# Patient Record
Sex: Male | Born: 1960 | Hispanic: No | Marital: Single
Health system: Midwestern US, Community
[De-identification: ages and names within clinical notes are randomized; demographics above are authoritative.]

## PROBLEM LIST (undated history)

## (undated) DIAGNOSIS — I219 Acute myocardial infarction, unspecified: Secondary | ICD-10-CM

## (undated) DIAGNOSIS — J449 Chronic obstructive pulmonary disease, unspecified: Secondary | ICD-10-CM

## (undated) DIAGNOSIS — R0600 Dyspnea, unspecified: Secondary | ICD-10-CM

## (undated) DIAGNOSIS — R251 Tremor, unspecified: Secondary | ICD-10-CM

## (undated) DIAGNOSIS — G8929 Other chronic pain: Secondary | ICD-10-CM

## (undated) DIAGNOSIS — I639 Cerebral infarction, unspecified: Secondary | ICD-10-CM

## (undated) DIAGNOSIS — K279 Peptic ulcer, site unspecified, unspecified as acute or chronic, without hemorrhage or perforation: Secondary | ICD-10-CM

## (undated) DIAGNOSIS — M1990 Unspecified osteoarthritis, unspecified site: Secondary | ICD-10-CM

## (undated) DIAGNOSIS — I209 Angina pectoris, unspecified: Secondary | ICD-10-CM

## (undated) DIAGNOSIS — K5792 Diverticulitis of intestine, part unspecified, without perforation or abscess without bleeding: Secondary | ICD-10-CM

## (undated) DIAGNOSIS — K219 Gastro-esophageal reflux disease without esophagitis: Secondary | ICD-10-CM

## (undated) DIAGNOSIS — Z7901 Long term (current) use of anticoagulants: Secondary | ICD-10-CM

## (undated) DIAGNOSIS — F32A Depression, unspecified: Secondary | ICD-10-CM

## (undated) DIAGNOSIS — F1910 Other psychoactive substance abuse, uncomplicated: Secondary | ICD-10-CM

## (undated) DIAGNOSIS — F319 Bipolar disorder, unspecified: Secondary | ICD-10-CM

## (undated) DIAGNOSIS — I38 Endocarditis, valve unspecified: Secondary | ICD-10-CM

## (undated) DIAGNOSIS — C801 Malignant (primary) neoplasm, unspecified: Secondary | ICD-10-CM

## (undated) DIAGNOSIS — J984 Other disorders of lung: Secondary | ICD-10-CM

## (undated) DIAGNOSIS — F209 Schizophrenia, unspecified: Secondary | ICD-10-CM

## (undated) DIAGNOSIS — I2510 Atherosclerotic heart disease of native coronary artery without angina pectoris: Secondary | ICD-10-CM

## (undated) DIAGNOSIS — F329 Major depressive disorder, single episode, unspecified: Secondary | ICD-10-CM

## (undated) DIAGNOSIS — F419 Anxiety disorder, unspecified: Secondary | ICD-10-CM

## (undated) DIAGNOSIS — E785 Hyperlipidemia, unspecified: Secondary | ICD-10-CM

## (undated) DIAGNOSIS — B1920 Unspecified viral hepatitis C without hepatic coma: Secondary | ICD-10-CM

## (undated) DIAGNOSIS — I5189 Other ill-defined heart diseases: Secondary | ICD-10-CM

## (undated) DIAGNOSIS — M719 Bursopathy, unspecified: Secondary | ICD-10-CM

## (undated) DIAGNOSIS — I10 Essential (primary) hypertension: Secondary | ICD-10-CM

## (undated) DIAGNOSIS — N50811 Right testicular pain: Secondary | ICD-10-CM

## (undated) DIAGNOSIS — R300 Dysuria: Secondary | ICD-10-CM

## (undated) HISTORY — PX: BACK SURGERY: SHX140

## (undated) HISTORY — DX: Unspecified viral hepatitis C without hepatic coma: B19.20

## (undated) HISTORY — PX: SPINE SURGERY: SHX786

## (undated) HISTORY — PX: APPENDECTOMY: SHX54

## (undated) HISTORY — PX: CORONARY ANGIOPLASTY WITH STENT PLACEMENT: SHX49

## (undated) HISTORY — PX: ABDOMINAL SURGERY: SHX537

## (undated) HISTORY — PX: COLONOSCOPY: SHX5424

## (undated) HISTORY — PX: COLON SURGERY: SHX602

## (undated) HISTORY — DX: Other disorders of lung: J98.4

---

## 1998-08-19 ENCOUNTER — Emergency Department (HOSPITAL_COMMUNITY): Admission: EM | Admit: 1998-08-19 | Discharge: 1998-08-19 | Payer: Self-pay | Admitting: Emergency Medicine

## 1998-08-19 ENCOUNTER — Encounter: Payer: Self-pay | Admitting: Emergency Medicine

## 1999-01-21 ENCOUNTER — Emergency Department (HOSPITAL_COMMUNITY): Admission: EM | Admit: 1999-01-21 | Discharge: 1999-01-21 | Payer: Self-pay

## 1999-02-23 ENCOUNTER — Emergency Department (HOSPITAL_COMMUNITY): Admission: EM | Admit: 1999-02-23 | Discharge: 1999-02-23 | Payer: Self-pay | Admitting: *Deleted

## 2001-05-26 ENCOUNTER — Inpatient Hospital Stay (HOSPITAL_COMMUNITY): Admission: EM | Admit: 2001-05-26 | Discharge: 2001-05-31 | Payer: Self-pay | Admitting: Psychiatry

## 2005-09-26 ENCOUNTER — Emergency Department (HOSPITAL_COMMUNITY): Admission: EM | Admit: 2005-09-26 | Discharge: 2005-09-26 | Payer: Self-pay | Admitting: Emergency Medicine

## 2010-07-25 DIAGNOSIS — B1920 Unspecified viral hepatitis C without hepatic coma: Secondary | ICD-10-CM

## 2010-07-25 HISTORY — DX: Unspecified viral hepatitis C without hepatic coma: B19.20

## 2011-03-31 ENCOUNTER — Emergency Department (HOSPITAL_COMMUNITY)
Admission: EM | Admit: 2011-03-31 | Discharge: 2011-03-31 | Disposition: A | Discharge status: Home / Self Care | Payer: Self-pay | Attending: Emergency Medicine | Admitting: Emergency Medicine

## 2011-03-31 DIAGNOSIS — M25519 Pain in unspecified shoulder: Secondary | ICD-10-CM | POA: Insufficient documentation

## 2011-03-31 DIAGNOSIS — Z8619 Personal history of other infectious and parasitic diseases: Secondary | ICD-10-CM | POA: Insufficient documentation

## 2011-03-31 DIAGNOSIS — F329 Major depressive disorder, single episode, unspecified: Secondary | ICD-10-CM | POA: Insufficient documentation

## 2011-03-31 DIAGNOSIS — Z79899 Other long term (current) drug therapy: Secondary | ICD-10-CM | POA: Insufficient documentation

## 2011-03-31 DIAGNOSIS — M545 Low back pain, unspecified: Secondary | ICD-10-CM | POA: Insufficient documentation

## 2011-03-31 DIAGNOSIS — F3289 Other specified depressive episodes: Secondary | ICD-10-CM | POA: Insufficient documentation

## 2011-03-31 DIAGNOSIS — M25559 Pain in unspecified hip: Secondary | ICD-10-CM | POA: Insufficient documentation

## 2011-04-21 ENCOUNTER — Emergency Department (HOSPITAL_COMMUNITY)
Admission: EM | Admit: 2011-04-21 | Discharge: 2011-04-21 | Disposition: A | Discharge status: Home / Self Care | Payer: Self-pay | Attending: Emergency Medicine | Admitting: Emergency Medicine

## 2011-04-21 DIAGNOSIS — T7840XA Allergy, unspecified, initial encounter: Secondary | ICD-10-CM | POA: Insufficient documentation

## 2011-04-21 DIAGNOSIS — R21 Rash and other nonspecific skin eruption: Secondary | ICD-10-CM | POA: Insufficient documentation

## 2011-04-21 DIAGNOSIS — L2989 Other pruritus: Secondary | ICD-10-CM | POA: Insufficient documentation

## 2011-04-21 DIAGNOSIS — L298 Other pruritus: Secondary | ICD-10-CM | POA: Insufficient documentation

## 2011-04-21 DIAGNOSIS — I10 Essential (primary) hypertension: Secondary | ICD-10-CM | POA: Insufficient documentation

## 2011-04-21 DIAGNOSIS — F329 Major depressive disorder, single episode, unspecified: Secondary | ICD-10-CM | POA: Insufficient documentation

## 2011-04-21 DIAGNOSIS — Z79899 Other long term (current) drug therapy: Secondary | ICD-10-CM | POA: Insufficient documentation

## 2011-04-21 DIAGNOSIS — F3289 Other specified depressive episodes: Secondary | ICD-10-CM | POA: Insufficient documentation

## 2011-04-21 DIAGNOSIS — L509 Urticaria, unspecified: Secondary | ICD-10-CM | POA: Insufficient documentation

## 2011-04-21 DIAGNOSIS — Z8619 Personal history of other infectious and parasitic diseases: Secondary | ICD-10-CM | POA: Insufficient documentation

## 2011-05-03 ENCOUNTER — Emergency Department (HOSPITAL_COMMUNITY)
Admission: EM | Admit: 2011-05-03 | Discharge: 2011-05-03 | Disposition: A | Discharge status: Home / Self Care | Payer: Self-pay | Attending: Emergency Medicine | Admitting: Emergency Medicine

## 2011-05-03 DIAGNOSIS — M542 Cervicalgia: Secondary | ICD-10-CM | POA: Insufficient documentation

## 2011-05-03 DIAGNOSIS — Z8619 Personal history of other infectious and parasitic diseases: Secondary | ICD-10-CM | POA: Insufficient documentation

## 2011-05-03 DIAGNOSIS — M25539 Pain in unspecified wrist: Secondary | ICD-10-CM | POA: Insufficient documentation

## 2011-05-03 DIAGNOSIS — X58XXXA Exposure to other specified factors, initial encounter: Secondary | ICD-10-CM | POA: Insufficient documentation

## 2011-05-03 DIAGNOSIS — M25519 Pain in unspecified shoulder: Secondary | ICD-10-CM | POA: Insufficient documentation

## 2011-05-03 DIAGNOSIS — S139XXA Sprain of joints and ligaments of unspecified parts of neck, initial encounter: Secondary | ICD-10-CM | POA: Insufficient documentation

## 2011-05-03 DIAGNOSIS — M25529 Pain in unspecified elbow: Secondary | ICD-10-CM | POA: Insufficient documentation

## 2011-05-03 DIAGNOSIS — M129 Arthropathy, unspecified: Secondary | ICD-10-CM | POA: Insufficient documentation

## 2011-07-27 DIAGNOSIS — M1990 Unspecified osteoarthritis, unspecified site: Secondary | ICD-10-CM | POA: Insufficient documentation

## 2011-11-04 ENCOUNTER — Encounter (HOSPITAL_COMMUNITY): Payer: Self-pay | Admitting: Emergency Medicine

## 2011-11-04 ENCOUNTER — Emergency Department (HOSPITAL_COMMUNITY)
Admission: EM | Admit: 2011-11-04 | Discharge: 2011-11-04 | Disposition: A | Discharge status: Home / Self Care | Payer: Self-pay | Attending: Emergency Medicine | Admitting: Emergency Medicine

## 2011-11-04 ENCOUNTER — Emergency Department (HOSPITAL_COMMUNITY): Payer: Self-pay

## 2011-11-04 DIAGNOSIS — J4489 Other specified chronic obstructive pulmonary disease: Secondary | ICD-10-CM | POA: Insufficient documentation

## 2011-11-04 DIAGNOSIS — J441 Chronic obstructive pulmonary disease with (acute) exacerbation: Secondary | ICD-10-CM

## 2011-11-04 DIAGNOSIS — J449 Chronic obstructive pulmonary disease, unspecified: Secondary | ICD-10-CM | POA: Insufficient documentation

## 2011-11-04 DIAGNOSIS — F17200 Nicotine dependence, unspecified, uncomplicated: Secondary | ICD-10-CM | POA: Insufficient documentation

## 2011-11-04 DIAGNOSIS — J40 Bronchitis, not specified as acute or chronic: Secondary | ICD-10-CM

## 2011-11-04 HISTORY — DX: Anxiety disorder, unspecified: F41.9

## 2011-11-04 HISTORY — DX: Depression, unspecified: F32.A

## 2011-11-04 HISTORY — DX: Bursopathy, unspecified: M71.9

## 2011-11-04 HISTORY — DX: Unspecified osteoarthritis, unspecified site: M19.90

## 2011-11-04 HISTORY — DX: Major depressive disorder, single episode, unspecified: F32.9

## 2011-11-04 LAB — BASIC METABOLIC PANEL
BUN: 8 mg/dL (ref 6–23)
CO2: 24 mEq/L (ref 19–32)
Calcium: 9.1 mg/dL (ref 8.4–10.5)
Glucose, Bld: 91 mg/dL (ref 70–99)
Potassium: 3.9 mEq/L (ref 3.5–5.1)
Sodium: 139 mEq/L (ref 135–145)

## 2011-11-04 LAB — CBC
HCT: 49.6 % (ref 39.0–52.0)
Hemoglobin: 17.3 g/dL — ABNORMAL HIGH (ref 13.0–17.0)
MCH: 33.8 pg (ref 26.0–34.0)
MCHC: 34.9 g/dL (ref 30.0–36.0)
RBC: 5.12 MIL/uL (ref 4.22–5.81)

## 2011-11-04 MED ORDER — SUMATRIPTAN SUCCINATE 6 MG/0.5ML ~~LOC~~ SOLN: 6.0000 mg | Freq: Once | SUBCUTANEOUS | Status: DC

## 2011-11-04 MED ORDER — PREDNISONE 20 MG PO TABS
60.0000 mg | ORAL_TABLET | Freq: Once | ORAL | Status: AC
  Administered 2011-11-04: 60 mg via ORAL
  Filled 2011-11-04: qty 3

## 2011-11-04 MED ORDER — PREDNISONE 50 MG PO TABS: 50.0000 mg | ORAL_TABLET | Freq: Every day | ORAL | Status: DC

## 2011-11-04 MED ORDER — ALBUTEROL SULFATE HFA 108 (90 BASE) MCG/ACT IN AERS
2.0000 | INHALATION_SPRAY | RESPIRATORY_TRACT | Status: DC | PRN
  Filled 2011-11-04: qty 6.7

## 2011-11-04 MED ORDER — IBUPROFEN 800 MG PO TABS
800.0000 mg | ORAL_TABLET | Freq: Once | ORAL | Status: AC
  Administered 2011-11-04: 800 mg via ORAL
  Filled 2011-11-04: qty 1

## 2011-11-04 MED ORDER — ALBUTEROL SULFATE (5 MG/ML) 0.5% IN NEBU
5.0000 mg | INHALATION_SOLUTION | Freq: Once | RESPIRATORY_TRACT | Status: AC
  Administered 2011-11-04: 5 mg via RESPIRATORY_TRACT
  Filled 2011-11-04: qty 0.5

## 2011-11-04 MED ORDER — PREDNISONE 20 MG PO TABS: 60.0000 mg | ORAL_TABLET | Freq: Once | ORAL | Status: DC

## 2011-11-04 MED ORDER — IPRATROPIUM BROMIDE 0.02 % IN SOLN
0.5000 mg | Freq: Once | RESPIRATORY_TRACT | Status: AC
  Administered 2011-11-04: 0.5 mg via RESPIRATORY_TRACT
  Filled 2011-11-04: qty 2.5

## 2011-11-04 MED ORDER — ALBUTEROL SULFATE (5 MG/ML) 0.5% IN NEBU
5.0000 mg | INHALATION_SOLUTION | Freq: Once | RESPIRATORY_TRACT | Status: AC
  Administered 2011-11-04: 5 mg via RESPIRATORY_TRACT
  Filled 2011-11-04: qty 1

## 2011-11-04 NOTE — ED Provider Notes (Signed)
History     CSN: [id number]  Arrival date & time 11/04/11  1011   First MD Initiated Contact with Patient 11/04/11 1035      Chief Complaint  Patient presents with  . Chest Pain    (Consider location/radiation/quality/duration/timing/severity/associated sxs/prior treatment) HPI Pt presents with c/o shortness of breath and tightness in his breathing which has been present over the past 1-2 months and getting worse over time.  He feels more short of breath with exertion and with talking.  He has had a cough but not productive.  No fever/chills.  No leg swelling.  No chest pain, no nausea or diaphoresis.  No hx DVT/PE, no recent travel or immobilization.  Pt does endorse smoking cigarettes. He has had no treatment prior to arrival. There are no associated systemic symptoms, there are no other alleviating or modifying factors.   Past Medical History  Diagnosis Date  . Anxiety   . Depression   . Arthritis   . Bursitis     Past Surgical History  Procedure Date  . Abdominal surgery     No family history on file.  History  Substance Use Topics  . Smoking status: Current Everyday Smoker -- 1.0 packs/day    Types: Cigarettes  . Smokeless tobacco: Not on file  . Alcohol Use: No      Review of Systems ROS reviewed and all otherwise negative except for mentioned in HPI  Allergies  Naproxen and Tramadol  Home Medications   Current Outpatient Rx  Name Route Sig Dispense Refill  . FLUTICASONE PROPIONATE 50 MCG/ACT NA SUSP Nasal Place 2 sprays into the nose daily as needed. For allergies.    . IBUPROFEN 800 MG PO TABS Oral Take 800 mg by mouth every 8 (eight) hours as needed. For pain.    Marland Kitchen LORATADINE 10 MG PO TABS Oral Take 10 mg by mouth daily.    Marland Kitchen MIRTAZAPINE 30 MG PO TABS Oral Take 30 mg by mouth at bedtime.    . OXYCODONE-ACETAMINOPHEN 5-325 MG PO TABS Oral Take 1 tablet by mouth every 6 (six) hours as needed. For pain.    Marland Kitchen PREDNISONE 20 MG PO TABS Oral Take 20 mg by  mouth daily.    Marland Kitchen RISPERIDONE 2 MG PO TABS Oral Take 1-2 mg by mouth 3 (three) times daily. 1 tab bid and 0.5 tab qhs    . PREDNISONE 50 MG PO TABS Oral Take 1 tablet (50 mg total) by mouth daily. 5 tablet 0    BP 133/68  Pulse 75  Temp(Src) 98.4 F (36.9 C) (Oral)  Resp 16  SpO2 96% Vitals reviewed Physical Exam Physical Examination: General appearance - alert, well appearing, and in no distress Mental status - alert, oriented to person, place, and time Eyes - pupils equal and reactive, no conjunctival injections Mouth - mucous membranes moist, pharynx normal without lesions Chest - clear to auscultation, no wheezes, rales or rhonchi, symmetric air entry Heart - normal rate, regular rhythm, normal S1, S2, no murmurs, rubs, clicks or gallops Abdomen - soft, nontender, nondistended, no masses or organomegaly, nabs Extremities - peripheral pulses normal, no pedal edema, no clubbing or cyanosis Skin - normal coloration and turgor, no rashes, brisk cap refill  ED Course  Procedures (including critical care time)   Date: 11/04/2011  Rate: 67  Rhythm: normal sinus rhythm  QRS Axis: normal  Intervals: normal  ST/T Wave abnormalities: normal  Conduction Disutrbances:none  Narrative Interpretation:   Old EKG Reviewed: unchanged  compared to prior ekg of 05/25/01    Labs Reviewed  CBC - Abnormal; Notable for the following:    Hemoglobin 17.3 (*)    Platelets 137 (*)    All other components within normal limits  BASIC METABOLIC PANEL  LAB REPORT - SCANNED   No results found.   1. COPD exacerbation   2. Bronchitis       MDM  Pt presents with c/o shortness of breath, and wheezing.  He is a cigarette smoker- no known hx of COPD.  Symtpoms have been ongoing and worsening over the past 1-2 months.  Bilateral wheezing on exam.  CXR reasuring. Symptoms most c/w COPD exacerbation/bronchitis. Pt feels improved after albuterol/atrovent- has been started on steroids.  Will d/c with  course of prednisone and albuterol MDI.  He was given stirct return precautions.  And is agreeable with this plan.         Ethelda Chick, MD 11/07/11 [phone]

## 2011-11-04 NOTE — Discharge Instructions (Signed)
Return to the ED with any concerns including difficulty breathing despite using albuterol inhaler every 4 hours, chest pain, leg swelling, fainting, decreased level of alertness/lethargy, or any other alarming symptoms  You should use the albuterol inhaler- 2 puffs every 4 hours

## 2011-11-04 NOTE — ED Notes (Signed)
Pt presenting to ed with c/o chest pain that been going on x couple of months with shortness of breath and nausea. Pt states symptoms are worse at night. Pt states positive shortness of breath

## 2011-12-05 DIAGNOSIS — F1411 Cocaine abuse, in remission: Secondary | ICD-10-CM | POA: Insufficient documentation

## 2012-02-10 DIAGNOSIS — M75120 Complete rotator cuff tear or rupture of unspecified shoulder, not specified as traumatic: Secondary | ICD-10-CM | POA: Insufficient documentation

## 2012-03-08 ENCOUNTER — Emergency Department (HOSPITAL_COMMUNITY): Payer: Medicaid Other

## 2012-03-08 ENCOUNTER — Encounter (HOSPITAL_COMMUNITY): Payer: Self-pay | Admitting: Vascular Surgery

## 2012-03-08 ENCOUNTER — Emergency Department (HOSPITAL_COMMUNITY)
Admission: EM | Admit: 2012-03-08 | Discharge: 2012-03-08 | Disposition: A | Discharge status: Home / Self Care | Payer: Medicaid Other | Attending: Emergency Medicine | Admitting: Emergency Medicine

## 2012-03-08 DIAGNOSIS — G589 Mononeuropathy, unspecified: Secondary | ICD-10-CM | POA: Insufficient documentation

## 2012-03-08 DIAGNOSIS — R259 Unspecified abnormal involuntary movements: Secondary | ICD-10-CM | POA: Insufficient documentation

## 2012-03-08 DIAGNOSIS — G629 Polyneuropathy, unspecified: Secondary | ICD-10-CM

## 2012-03-08 DIAGNOSIS — J45909 Unspecified asthma, uncomplicated: Secondary | ICD-10-CM | POA: Insufficient documentation

## 2012-03-08 DIAGNOSIS — F329 Major depressive disorder, single episode, unspecified: Secondary | ICD-10-CM | POA: Insufficient documentation

## 2012-03-08 DIAGNOSIS — Z886 Allergy status to analgesic agent status: Secondary | ICD-10-CM | POA: Insufficient documentation

## 2012-03-08 DIAGNOSIS — F411 Generalized anxiety disorder: Secondary | ICD-10-CM | POA: Insufficient documentation

## 2012-03-08 DIAGNOSIS — R253 Fasciculation: Secondary | ICD-10-CM

## 2012-03-08 DIAGNOSIS — F3289 Other specified depressive episodes: Secondary | ICD-10-CM | POA: Insufficient documentation

## 2012-03-08 DIAGNOSIS — G249 Dystonia, unspecified: Secondary | ICD-10-CM

## 2012-03-08 DIAGNOSIS — Z79899 Other long term (current) drug therapy: Secondary | ICD-10-CM | POA: Insufficient documentation

## 2012-03-08 MED ORDER — DIPHENHYDRAMINE HCL 25 MG PO CAPS: 25.0000 mg | ORAL_CAPSULE | Freq: Four times a day (QID) | ORAL | Status: DC | PRN

## 2012-03-08 NOTE — ED Notes (Addendum)
Pt reports he cannot sleep because his arms and legs won't stop moving. He states, "its like I have to move them, it feels like there is electricity in them and then he has to move them." This occurs in both his legs and arms. Denies numbness and tingling, denies feeling like pins and needles. States this has been happening for 2 weeks. States it used to do it every once in a while but now it is happening ever night. Nothing seems to help (iepositioning and medication). Pt states he came in today because he cannot take the lack of sleep anymore. States he was on Oxycodone for his chronic shoulder pain and that this helped him sleep but he didn't like how they made him feel so his doctor took him off.

## 2012-03-08 NOTE — ED Notes (Signed)
Pt reports that his arms and legs move all night long and that he cannot sleep because of this. States it feels like electricity shoots down his extremities and he has to move them.

## 2012-03-08 NOTE — ED Provider Notes (Signed)
History     CSN: [id number]  Arrival date & time 03/08/12  4098   First MD Initiated Contact with Patient 03/08/12 [phone]      Chief Complaint  Patient presents with  . Insomnia    (Consider location/radiation/quality/duration/timing/severity/associated sxs/prior treatment) HPI Comments: Pt with hx of alcohol abuse and cocaine abuse several years ago, hx of depression on risperdal and remeron comes in with cc of insomnia and muscular complains. Pt reports that for the past few days he has been having this sensation mostly at bedtime, where he feels "urge to move" his extremities. If he doesn't moves, he has some involuntary moving, if he does move the extremities voluntarily, he feels better for a little while - but then the urge to move starts building up again. He has no pain associated with that. No numbness, tingling. Pt has no new meds. No prior hx of similar complains.  The history is provided by the patient.    Past Medical History  Diagnosis Date  . Anxiety   . Depression   . Arthritis   . Bursitis     Past Surgical History  Procedure Date  . Abdominal surgery   . Appendectomy     Family History  Problem Relation Age of Onset  . Osteoarthritis Mother   . Stroke Mother   . Heart failure Father     History  Substance Use Topics  . Smoking status: Former Smoker -- 1.0 packs/day    Quit date: 03/04/2012  . Smokeless tobacco: Not on file  . Alcohol Use: No      Review of Systems  Constitutional: Negative for activity change and appetite change.  Respiratory: Negative for cough and shortness of breath.   Cardiovascular: Negative for chest pain.  Gastrointestinal: Negative for abdominal pain.  Genitourinary: Negative for dysuria.    Allergies  Naproxen and Tramadol  Home Medications   Current Outpatient Rx  Name Route Sig Dispense Refill  . ACETAMINOPHEN 500 MG PO TABS Oral Take 1,000 mg by mouth every 6 (six) hours as needed. For pain.    .  ALBUTEROL SULFATE HFA 108 (90 BASE) MCG/ACT IN AERS Inhalation Inhale 2 puffs into the lungs every 6 (six) hours as needed. For shortness of breath.    . DICLOFENAC SODIUM 75 MG PO TBEC Oral Take 75 mg by mouth 2 (two) times daily.    Marland Kitchen FLUTICASONE-SALMETEROL 250-50 MCG/DOSE IN AEPB Inhalation Inhale 1 puff into the lungs every 12 (twelve) hours.    Marland Kitchen MIRTAZAPINE 30 MG PO TABS Oral Take 30 mg by mouth at bedtime.    . OXYCODONE-ACETAMINOPHEN 5-325 MG PO TABS Oral Take 1 tablet by mouth every 6 (six) hours as needed. For pain.    Marland Kitchen RISPERIDONE 2 MG PO TABS Oral Take 1-2 mg by mouth 3 (three) times daily. 1 tab bid and 0.5 tab qhs      BP 127/75  Pulse 61  Temp 97.6 F (36.4 C) (Oral)  Resp 20  SpO2 99%  Physical Exam  Vitals reviewed. Constitutional: He is oriented to person, place, and time. He appears well-developed and well-nourished.  HENT:  Head: Normocephalic and atraumatic.  Eyes: EOM are normal. Pupils are equal, round, and reactive to light.  Neck: Normal range of motion. Neck supple. No JVD present.  Cardiovascular: Normal rate and regular rhythm.   Pulmonary/Chest: Effort normal and breath sounds normal. No respiratory distress. He has no wheezes.  Abdominal: Soft. Bowel sounds are normal. He exhibits no  distension. There is no tenderness. There is no rebound and no guarding.  Neurological: He is alert and oriented to person, place, and time. No cranial nerve deficit. Coordination normal.       NIHSS - 0 No objective sensory deficits, Motor strength upper and lower extremity 4+ and equal Normal cerebellar exam  Skin: Skin is warm and dry.    ED Course  Procedures (including critical care time)  Labs Reviewed - No data to display Dg Cervical Spine Complete  03/08/2012  *RADIOLOGY REPORT*  Clinical Data: Upper extremity tingling  CERVICAL SPINE - COMPLETE 4+ VIEW  Comparison: None.  Findings: Five views of the cervical spine submitted.  No acute fracture or subluxation.   There is disc space flattening with mild anterior spurring at C5-C6 and C6-C7 level.  Narrowing of the neural foramina noted bilaterally at C5-C6 and C6-C7 level.  No prevertebral soft tissue swelling.  Cervical airway is patent.  C1- C2 relationship is unremarkable.  IMPRESSION: No acute fracture or subluxation.  Degenerative changes at C5-C6 and C6-C7 level as described above.  Original Report Authenticated By: Natasha Mead, M.D.     1. Muscular fasciculation   2. Neuropathy       MDM  Ddx: Dystonia Neuropathy Muscular disorder electrolyte abnormality Medication side effects  A/P: Pt comes in with this very atypical, intermittent complain (asymptomatic at ED presentation), that is typically present at night time. My Neuro exam is WNL. Unsure what is causing the symptoms. Could be meds related. Will need PCP f/u.           Derwood Kaplan, MD 03/08/12 1148

## 2012-04-09 HISTORY — PX: SHOULDER SURGERY: SHX246

## 2012-06-27 ENCOUNTER — Encounter: Payer: Self-pay | Admitting: Family Medicine

## 2012-06-27 ENCOUNTER — Ambulatory Visit (INDEPENDENT_AMBULATORY_CARE_PROVIDER_SITE_OTHER): Payer: Self-pay | Admitting: Family Medicine

## 2012-06-27 VITALS — BP 133/78 | HR 56 | Temp 97.7°F | Ht 75.0 in | Wt 246.8 lb

## 2012-06-27 DIAGNOSIS — J441 Chronic obstructive pulmonary disease with (acute) exacerbation: Secondary | ICD-10-CM | POA: Insufficient documentation

## 2012-06-27 DIAGNOSIS — F259 Schizoaffective disorder, unspecified: Secondary | ICD-10-CM | POA: Insufficient documentation

## 2012-06-27 DIAGNOSIS — M1990 Unspecified osteoarthritis, unspecified site: Secondary | ICD-10-CM

## 2012-06-27 DIAGNOSIS — B182 Chronic viral hepatitis C: Secondary | ICD-10-CM | POA: Insufficient documentation

## 2012-06-27 DIAGNOSIS — M25519 Pain in unspecified shoulder: Secondary | ICD-10-CM

## 2012-06-27 DIAGNOSIS — K219 Gastro-esophageal reflux disease without esophagitis: Secondary | ICD-10-CM | POA: Insufficient documentation

## 2012-06-27 DIAGNOSIS — J449 Chronic obstructive pulmonary disease, unspecified: Secondary | ICD-10-CM | POA: Insufficient documentation

## 2012-06-27 DIAGNOSIS — F209 Schizophrenia, unspecified: Secondary | ICD-10-CM

## 2012-06-27 NOTE — Assessment & Plan Note (Signed)
Advised patient to use Albuterol 4 times per day while awake x 2 days until SOB resolves. Continue Advair daily. Lung sounds clear on exam today. Red flags reviewed with patient.

## 2012-06-27 NOTE — Assessment & Plan Note (Signed)
Will continue Diclofenac BID and Tylenol (not to exceed 3 g daily for Hep C). Consider PT referral and Pain clinic referral once he receives orange card. Consider adding Voltaren gel if orange card will pay for it. Follow up in 4 weeks.

## 2012-06-27 NOTE — Patient Instructions (Addendum)
It was nice to meet you today, Keith Gibbs. Once you receive your orange card, please call and schedule follow up appointment. For arthritis, please continue to take Diclofenac and Tylenol over the counter.  Do not exceed 3000 mg per day. For acid reflux, please pick up over the counter Pepcid and/or Rolaids or Tums as needed. For COPD, please continue to take Advair daily and Albuterol every 6 hours for the next 48 hours. Return to clinic in 4 weeks.

## 2012-06-27 NOTE — Progress Notes (Signed)
  Subjective:    Patient ID: Keith Gibbs, male    DOB: 1960-09-15, 51 y.o.   MRN: [id number]  HPI  Patient presents to clinic to establish care, applying for orange card.  He has not had a primary care doctor, but has been going to an orthopedic surgeon at [facility] and goes to [facility] for hx schizophrenia.  He also goes to [facility] for Hepatitis C.  Joint Pain: Pain has a long hx of OA.  He complains mostly of bilateral knee, shoulder, and hip pain.  RT shoulder recently operated on in September, but patient says he still hears popping and grinding.  He has finished PT and ROM has improved, but still feels a dull chronic pain.  Patient takes OTC Tylenol and Diclofenac BID which do not seem helping much anymore.    SOB: Patient has hx COPD.  Former smoker, quit 3 months ago.  Says he still continues to get SOB with exertion and chest heaviness despite using Advair daily and Albuterol.  He was recently on steroids for shoulder pain and he was not happy with side effect, mostly weight gain.  Denies any chest pain associated with nausea/vomiting or excessive.  Denies any cough, fever, chills, or NS.    Review of Systems  Per HPI    Objective:   Physical Exam  Constitutional: No distress.  HENT:  Head: Normocephalic and atraumatic.  Cardiovascular: Normal rate, regular rhythm and normal heart sounds.   Pulmonary/Chest: Effort normal and breath sounds normal. No respiratory distress. He has no wheezes. He has no rales.       Assessment & Plan:

## 2012-06-27 NOTE — Assessment & Plan Note (Signed)
Patient has follow up appointment at [facility].  Once he has orange card, may be able to transfer care to [place]'s Hep C clinic.

## 2012-07-27 ENCOUNTER — Encounter: Payer: Self-pay | Admitting: Family Medicine

## 2012-07-27 ENCOUNTER — Ambulatory Visit (HOSPITAL_COMMUNITY)
Admission: RE | Admit: 2012-07-27 | Discharge: 2012-07-27 | Disposition: A | Discharge status: Home / Self Care | Payer: Medicaid Other | Source: Ambulatory Visit | Attending: Family Medicine | Admitting: Family Medicine

## 2012-07-27 ENCOUNTER — Other Ambulatory Visit: Payer: Self-pay | Admitting: Family Medicine

## 2012-07-27 ENCOUNTER — Ambulatory Visit (INDEPENDENT_AMBULATORY_CARE_PROVIDER_SITE_OTHER): Payer: Self-pay | Admitting: Family Medicine

## 2012-07-27 DIAGNOSIS — N5089 Other specified disorders of the male genital organs: Secondary | ICD-10-CM | POA: Insufficient documentation

## 2012-07-27 DIAGNOSIS — M1990 Unspecified osteoarthritis, unspecified site: Secondary | ICD-10-CM

## 2012-07-27 DIAGNOSIS — N509 Disorder of male genital organs, unspecified: Secondary | ICD-10-CM | POA: Insufficient documentation

## 2012-07-27 DIAGNOSIS — I861 Scrotal varices: Secondary | ICD-10-CM | POA: Insufficient documentation

## 2012-07-27 DIAGNOSIS — N433 Hydrocele, unspecified: Secondary | ICD-10-CM | POA: Insufficient documentation

## 2012-07-27 MED ORDER — MELOXICAM 15 MG PO TABS: 15.0000 mg | ORAL_TABLET | Freq: Every day | ORAL | Status: DC

## 2012-07-27 MED ORDER — TERBINAFINE HCL 1 % EX CREA: TOPICAL_CREAM | Freq: Two times a day (BID) | CUTANEOUS | Status: DC

## 2012-07-27 NOTE — Patient Instructions (Addendum)
It was nice to see you again.  We will set up time/date of your ultrasound. Pick up cream for your nail and Mobic at your pharmacy for arthritis. Once you receive orange card, give [place] a call and schedule a follow up appointment with me. Happy New Year, [address]

## 2012-07-27 NOTE — Assessment & Plan Note (Signed)
Testicle RT less swollen today, but he describes a pulling sensation.  Will order an U/S to rule out masses.  He may need a urology referral when he receives orange card.

## 2012-07-27 NOTE — Progress Notes (Signed)
  Subjective:    Patient ID: Keith Gibbs, male    DOB: October 27, 1960, 52 y.o.   MRN: [id number]  HPI  Patient here for follow up arthritis - taking Tylenol 500 mg every about 6 times per day despite hx of Hep C.  He also takes Diclofenac 75 BID which does not seem to be helping anymore.  Pain is getting worse.   Pain scale 10/10.  Worse pain in LT hip and bilateral knees.  He has had steroid injections in LT hip at [facility], but lost insurance and cannot go back there.  He is interested in Pain Clinic referral once he receives orange card.  Does not have a cane, but says he could probably use one.  He says he cannot walk very far - cannot walk around [employer] without sitting down.  Endorses numbness/tingling sometimes, but better with re-positioning.    Patient also concerned about RT testicular swelling.  Has been going for several months, on and off.  He complains of pain "pulling sensation"  in RT testicle after intercourse, but denies any dysuria, discharge, or difficulty voiding.  Review of Systems  Per HPI    Objective:   Physical Exam  Constitutional: He appears well-nourished. No distress.  Musculoskeletal:       Right knee: He exhibits decreased range of motion. He exhibits no swelling and no effusion. tenderness found.       Left knee: He exhibits decreased range of motion. He exhibits no swelling. tenderness found.       + crepitus and locking of bilateral knees with flexion and extension GU: RT testicle is slightly larger than LT, but it has been more swollen in past, non-tender on palpation, no erythema     Assessment & Plan:

## 2012-07-27 NOTE — Assessment & Plan Note (Signed)
Will D/C diclofenac and try Mobic 15 daily x 30 days for pain. Continue Tylenol PRN but advised patient to take it 3 times per day due to hx hepatitis. Once he receives orange card, will refer to PT. Follow up in 1-2 months after orange card processed.

## 2012-08-14 ENCOUNTER — Encounter: Payer: Self-pay | Admitting: Family Medicine

## 2012-08-29 ENCOUNTER — Telehealth: Payer: Self-pay | Admitting: Family Medicine

## 2012-08-29 ENCOUNTER — Encounter: Payer: Self-pay | Admitting: Family Medicine

## 2012-08-29 ENCOUNTER — Ambulatory Visit (INDEPENDENT_AMBULATORY_CARE_PROVIDER_SITE_OTHER): Payer: No Typology Code available for payment source | Admitting: Family Medicine

## 2012-08-29 VITALS — BP 142/89 | HR 57 | Temp 99.1°F | Ht 75.0 in | Wt 255.0 lb

## 2012-08-29 DIAGNOSIS — M25559 Pain in unspecified hip: Secondary | ICD-10-CM

## 2012-08-29 DIAGNOSIS — M25552 Pain in left hip: Secondary | ICD-10-CM

## 2012-08-29 DIAGNOSIS — N5089 Other specified disorders of the male genital organs: Secondary | ICD-10-CM

## 2012-08-29 MED ORDER — MUPIROCIN 2 % EX OINT: TOPICAL_OINTMENT | Freq: Three times a day (TID) | CUTANEOUS | Status: DC

## 2012-08-29 MED ORDER — GABAPENTIN 300 MG PO CAPS: 300.0000 mg | ORAL_CAPSULE | Freq: Three times a day (TID) | ORAL | Status: DC | PRN

## 2012-08-29 MED ORDER — LIDOCAINE 5 % EX PTCH: 1.0000 | MEDICATED_PATCH | CUTANEOUS | Status: DC

## 2012-08-29 NOTE — Progress Notes (Signed)
  Subjective:    Patient ID: Keith Gibbs, male    DOB: [birth year], 52 y.o.   MRN: [id number]  HPI  Hydrocele and varicocele on U/S: He complains of intermittent swelling and discomfort of scrotum that occurs mostly after sexual intercourse.  He says that swelling occurs after intercourse and patient has to urinate 3-4 times afterwards.  Ultrasound did not show torsion, but it did show a small varicocele or hydrocele.  I offered referral to Urology, but patient says he will wait because this is not as bothersome.  Hip pain, left side: Patient says Mobic not working for hip pain and knee pain bilaterally (left worse than right).  He has had these symptoms for about 2 years.  He used to go to [facility] and had two steroid injections to left hip with no relief.  Patient cannot work anymore due to pain.  He complains of pain during sleep when he turns onto left side.  Pain does not wake him up. He can only walk less than one block.  He is interested in surgery and a referral to orthopedist.  Does endorse numbness/tingling of lower extremities.  For pain, he takes Mobic with no relief.  Review of Systems  Per HPI    Objective:   Physical Exam  Constitutional: He appears well-nourished. No distress.  Musculoskeletal:       Low back: full ROM with flexion, extension, but limited hip rotation to LT secondary to pain; no tenderness on palpation of low back, pelvis, femur.  No skin changes.  No swelling.  Neurological: He is alert. No cranial nerve deficit. Coordination normal.  Skin: No rash noted.      Assessment & Plan:

## 2012-08-29 NOTE — Assessment & Plan Note (Signed)
Discussed [place] results with patient.  Offered referral to urology, but patient would rather discuss hip pain and referral to Orthopedist.  Scrotal swelling is not that bothersome to patient.

## 2012-08-29 NOTE — Patient Instructions (Signed)
I will refer you to Orthopedic Surgery for your hip pain. For pain, please take Gabapentin 1 tablet every 8 hours as needed for pain. You may also try placing a Lidocaine patch to your left hip and see if this helps. We will call you with time and date of Ortho appointment. If you develop worsening pain not relieved by medications, frequent falls, or inability to bear weight on hip, please report to ER.  For nail infection, continue Lamisil and start antibiotic.  Paronychia  Paronychia is an infection of the skin caused by germs. It happens by the fingernail or toenail. You can avoid it by not:  Pulling on hangnails.  Nail biting.  Thumb sucking.  Cutting fingernails and toenails too short.  Cutting the skin at the base and sides of the fingernail or toenail (cuticle). HOME CARE  Keep the fingers or toes very dry. Put rubber gloves over cotton gloves when putting hands in water.  Keep the wound clean and bandaged (dressed) as told by your doctor.  Soak the fingers or toes in warm water for 15 to 20 minutes. Soak them 3 to 4 times per day for germ infections. Fungal infections are difficult to treat. Fungal infections often require treatment for a long time.  Only take medicine as told by your doctor. GET HELP RIGHT AWAY IF:   You have redness, puffiness (swelling), or pain that gets worse.  You see yellowish-white fluid (pus) coming from the wound.  You have a fever.  You have a bad smell coming from the wound or bandage. MAKE SURE YOU:  Understand these instructions.  Will watch your condition.  Will get help if you are not doing well or get worse. Document Released: 06/29/2009 Document Revised: 10/03/2011 Document Reviewed: 06/29/2009 [facility] Patient Information 2013 [place], [place].

## 2012-08-29 NOTE — Telephone Encounter (Signed)
Pt cannot afford the meds sent to [facility] and is not sure if this is something that the health dept has - needs to know what to do

## 2012-08-29 NOTE — Assessment & Plan Note (Signed)
Worsening acute on chronic left hip pain.  Used to see orthopedist at [facility] but could no longer afford it.  He has tried Mobic, Tylenol, and cortisone injections for pain without relief.  Now pain is affecting quality of life and ADLs.  Plan to refer to Orthopedic Surgery (via [facility] with orange card).  Treat pain with Gabapentin and Lidoderm patch.  Will consider referral to PT and Pain clinic, but will refer to Ortho first.  Return to clinic if symptoms worsen.  Red flags reviewed.

## 2012-08-31 MED ORDER — LIDOCAINE 5 % EX PTCH: 1.0000 | MEDICATED_PATCH | CUTANEOUS | Status: DC

## 2012-08-31 MED ORDER — MUPIROCIN 2 % EX OINT: TOPICAL_OINTMENT | Freq: Three times a day (TID) | CUTANEOUS | Status: DC

## 2012-08-31 MED ORDER — GABAPENTIN 300 MG PO CAPS: 300.0000 mg | ORAL_CAPSULE | Freq: Three times a day (TID) | ORAL | Status: DC | PRN

## 2012-08-31 NOTE — Telephone Encounter (Signed)
Pt is needing this to be taken care today

## 2012-08-31 NOTE — Telephone Encounter (Signed)
Now that patient has orange card, [facility]  I have printed out 3 Rx and will leave at front desk for pick up on Monday.  Please inform patient. Thank you.

## 2012-08-31 NOTE — Telephone Encounter (Signed)
Spoke with patient and informed him of rx up front for pick up

## 2012-10-02 ENCOUNTER — Telehealth: Payer: Self-pay | Admitting: Family Medicine

## 2012-10-02 ENCOUNTER — Other Ambulatory Visit: Payer: Self-pay | Admitting: Family Medicine

## 2012-10-02 DIAGNOSIS — K0889 Other specified disorders of teeth and supporting structures: Secondary | ICD-10-CM

## 2012-10-02 NOTE — Telephone Encounter (Signed)
Spoke with patient and informed him that a dental referral has been put in and they only take 10 patients per month due to him having the orange card. Referral will be faxed out 4/1. He also understands that there may be some time to wait for them to contact him about an appointment.

## 2012-10-02 NOTE — Telephone Encounter (Signed)
Pt needs a referral to go to Dental Clinic to have a tooth pulled - pls advise

## 2012-10-24 ENCOUNTER — Emergency Department (HOSPITAL_COMMUNITY)
Admission: EM | Admit: 2012-10-24 | Discharge: 2012-10-24 | Disposition: A | Discharge status: Home / Self Care | Payer: Self-pay | Attending: Emergency Medicine | Admitting: Emergency Medicine

## 2012-10-24 ENCOUNTER — Encounter (HOSPITAL_COMMUNITY): Payer: Self-pay | Admitting: *Deleted

## 2012-10-24 DIAGNOSIS — K089 Disorder of teeth and supporting structures, unspecified: Secondary | ICD-10-CM | POA: Insufficient documentation

## 2012-10-24 DIAGNOSIS — F329 Major depressive disorder, single episode, unspecified: Secondary | ICD-10-CM | POA: Insufficient documentation

## 2012-10-24 DIAGNOSIS — F3289 Other specified depressive episodes: Secondary | ICD-10-CM | POA: Insufficient documentation

## 2012-10-24 DIAGNOSIS — Z87891 Personal history of nicotine dependence: Secondary | ICD-10-CM | POA: Insufficient documentation

## 2012-10-24 DIAGNOSIS — IMO0002 Reserved for concepts with insufficient information to code with codable children: Secondary | ICD-10-CM | POA: Insufficient documentation

## 2012-10-24 DIAGNOSIS — F411 Generalized anxiety disorder: Secondary | ICD-10-CM | POA: Insufficient documentation

## 2012-10-24 DIAGNOSIS — Z8739 Personal history of other diseases of the musculoskeletal system and connective tissue: Secondary | ICD-10-CM | POA: Insufficient documentation

## 2012-10-24 DIAGNOSIS — Z79899 Other long term (current) drug therapy: Secondary | ICD-10-CM | POA: Insufficient documentation

## 2012-10-24 DIAGNOSIS — K0889 Other specified disorders of teeth and supporting structures: Secondary | ICD-10-CM

## 2012-10-24 DIAGNOSIS — Z8619 Personal history of other infectious and parasitic diseases: Secondary | ICD-10-CM | POA: Insufficient documentation

## 2012-10-24 DIAGNOSIS — M129 Arthropathy, unspecified: Secondary | ICD-10-CM | POA: Insufficient documentation

## 2012-10-24 MED ORDER — HYDROCODONE-ACETAMINOPHEN 5-325 MG PO TABS
2.0000 | ORAL_TABLET | Freq: Once | ORAL | Status: AC
  Administered 2012-10-24: 2 via ORAL
  Filled 2012-10-24: qty 2

## 2012-10-24 MED ORDER — PENICILLIN V POTASSIUM 500 MG PO TABS: 500.0000 mg | ORAL_TABLET | Freq: Four times a day (QID) | ORAL | Status: AC

## 2012-10-24 MED ORDER — HYDROCODONE-ACETAMINOPHEN 5-325 MG PO TABS: 2.0000 | ORAL_TABLET | Freq: Four times a day (QID) | ORAL | Status: DC | PRN

## 2012-10-24 NOTE — ED Provider Notes (Signed)
History     CSN: [id number]  Arrival date & time 10/24/12  0846   First MD Initiated Contact with Patient 10/24/12 0848      No chief complaint on file.   (Consider location/radiation/quality/duration/timing/severity/associated sxs/prior treatment) HPI Comments: Patient presents with a chief complaint of left upper dental pain.  He reports that the pain has been present for weeks, but worsened two days ago.  He currently does not have a dentist.  He attempted to call the dentist on his orange card, [facility] but was unable to get an appointment until May.  He denies any acute dental injury.  He has been taking Ibuprofen 800 mg for the pain without relief.  Patient is a 52 y.o. male presenting with tooth pain. The history is provided by the patient.  Dental Pain Additional symptoms include: gum swelling and gum tenderness. Additional symptoms do not include: purulent gums, trismus, facial swelling and trouble swallowing.    Past Medical History  Diagnosis Date  . Anxiety   . Depression   . Arthritis   . Bursitis   . Hepatitis C 2012    Past Surgical History  Procedure Laterality Date  . Abdominal surgery    . Appendectomy    . Shoulder surgery  04/09/2012    Family History  Problem Relation Age of Onset  . Osteoarthritis Mother   . Heart disease Mother   . Hypertension Mother   . Heart failure Father   . Heart disease Father   . Early death Father   . Hypertension Father   . Hypertension Sister     History  Substance Use Topics  . Smoking status: Former Smoker -- 1.00 packs/day    Quit date: 03/04/2012  . Smokeless tobacco: Not on file  . Alcohol Use: Yes      Review of Systems  HENT: Positive for dental problem. Negative for facial swelling, trouble swallowing and neck stiffness.     Allergies  Naproxen and Tramadol  Home Medications   Current Outpatient Rx  Name  Route  Sig  Dispense  Refill  . acetaminophen (TYLENOL) 500 MG tablet  Oral   Take 1,000 mg by mouth every 6 (six) hours as needed. For pain.         Marland Kitchen albuterol (PROVENTIL HFA;VENTOLIN HFA) 108 (90 BASE) MCG/ACT inhaler   Inhalation   Inhale 2 puffs into the lungs every 6 (six) hours as needed. For shortness of breath.         . diclofenac (VOLTAREN) 75 MG EC tablet   Oral   Take 75 mg by mouth 2 (two) times daily.         Marland Kitchen EXPIRED: diphenhydrAMINE (BENADRYL) 25 mg capsule   Oral   Take 1 capsule (25 mg total) by mouth every 6 (six) hours as needed (atypical movements, tremors).   30 capsule   0   . Fluticasone-Salmeterol (ADVAIR) 250-50 MCG/DOSE AEPB   Inhalation   Inhale 1 puff into the lungs every 12 (twelve) hours.         . gabapentin (NEURONTIN) 300 MG capsule   Oral   Take 1 capsule (300 mg total) by mouth 3 (three) times daily as needed.   90 capsule   0   . lidocaine (LIDODERM) 5 %   Transdermal   Place 1 patch onto the skin daily. Remove & Discard patch within 12 hours or as directed by MD   30 patch   0   .  mirtazapine (REMERON) 30 MG tablet   Oral   Take 30 mg by mouth at bedtime.         . mupirocin ointment (BACTROBAN) 2 %   Topical   Apply topically 3 (three) times daily.   22 g   0   . oxyCODONE-acetaminophen (PERCOCET) 5-325 MG per tablet   Oral   Take 1 tablet by mouth every 6 (six) hours as needed. For pain.         Marland Kitchen risperiDONE (RISPERDAL) 2 MG tablet   Oral   Take 1-2 mg by mouth 3 (three) times daily. 1 tab bid and 0.5 tab qhs         . terbinafine (LAMISIL) 1 % cream   Topical   Apply topically 2 (two) times daily.   30 g   0     BP 133/69  Pulse 70  Temp(Src) 97.4 F (36.3 C) (Oral)  Resp 14  SpO2 98%  Physical Exam  Nursing note and vitals reviewed. Constitutional: He is oriented to person, place, and time. He appears well-developed and well-nourished. No distress.  HENT:  Head: Normocephalic and atraumatic. No trismus in the jaw.  Mouth/Throat: Uvula is midline, oropharynx  is clear and moist and mucous membranes are normal. Abnormal dentition. No dental abscesses or edematous. No oropharyngeal exudate, posterior oropharyngeal edema, posterior oropharyngeal erythema or tonsillar abscesses.  Poor dental hygiene. Pt able to open and close mouth with out difficulty. Airway intact. Uvula midline. Mild gingival swelling with tenderness over affected area, but no fluctuance. No swelling or tenderness of submental and submandibular regions. No tongue elevation.  No sublingual tenderness.  Eyes: Conjunctivae and EOM are normal.  Neck: Normal range of motion and full passive range of motion without pain. Neck supple.  Cardiovascular: Normal rate and regular rhythm.   Pulmonary/Chest: Effort normal and breath sounds normal. No respiratory distress. He has no wheezes.  Musculoskeletal: Normal range of motion.  Lymphadenopathy:       Head (right side): No submental, no submandibular, no tonsillar, no preauricular and no posterior auricular adenopathy present.       Head (left side): No submental, no submandibular, no tonsillar, no preauricular and no posterior auricular adenopathy present.  Neurological: He is alert and oriented to person, place, and time.  Skin: Skin is warm and dry. No rash noted. He is not diaphoretic.    ED Course  Procedures (including critical care time)  Labs Reviewed - No data to display No results found.   No diagnosis found.    MDM  Patient with toothache.  No gross abscess.  Exam unconcerning for Ludwig's angina or spread of infection.  Will treat with penicillin and pain medicine.  Urged patient to follow-up with dentist.  Patient given referral to dentist on call.        Pascal Lux [place], PA-C 10/24/12 2155

## 2012-10-24 NOTE — ED Notes (Signed)
Pt comes with c/o of dental pain and swelling x 4 days.

## 2012-10-25 NOTE — ED Provider Notes (Signed)
Medical screening examination/treatment/procedure(s) were performed by non-physician practitioner and as supervising physician I was immediately available for consultation/collaboration.  Brynnlee Cumpian R. Hillary Struss, MD 10/25/12 1539 

## 2012-11-16 DIAGNOSIS — M169 Osteoarthritis of hip, unspecified: Secondary | ICD-10-CM | POA: Insufficient documentation

## 2012-11-30 ENCOUNTER — Other Ambulatory Visit: Payer: Self-pay

## 2012-11-30 ENCOUNTER — Encounter: Payer: Self-pay | Admitting: Family Medicine

## 2012-11-30 ENCOUNTER — Ambulatory Visit (HOSPITAL_COMMUNITY)
Admission: RE | Admit: 2012-11-30 | Discharge: 2012-11-30 | Disposition: A | Discharge status: Home / Self Care | Payer: No Typology Code available for payment source | Source: Ambulatory Visit | Attending: Family Medicine | Admitting: Family Medicine

## 2012-11-30 ENCOUNTER — Ambulatory Visit (INDEPENDENT_AMBULATORY_CARE_PROVIDER_SITE_OTHER): Payer: No Typology Code available for payment source | Admitting: Family Medicine

## 2012-11-30 VITALS — BP 126/70 | HR 52 | Temp 98.7°F | Ht 75.0 in | Wt 266.0 lb

## 2012-11-30 DIAGNOSIS — N5319 Other ejaculatory dysfunction: Secondary | ICD-10-CM | POA: Insufficient documentation

## 2012-11-30 DIAGNOSIS — N508 Other specified disorders of male genital organs: Secondary | ICD-10-CM

## 2012-11-30 DIAGNOSIS — R079 Chest pain, unspecified: Secondary | ICD-10-CM

## 2012-11-30 DIAGNOSIS — I498 Other specified cardiac arrhythmias: Secondary | ICD-10-CM | POA: Insufficient documentation

## 2012-11-30 LAB — POCT GLYCOSYLATED HEMOGLOBIN (HGB A1C): Hemoglobin A1C: 5.1

## 2012-11-30 LAB — LIPID PANEL
LDL Cholesterol: 43 mg/dL (ref 0–99)
Total CHOL/HDL Ratio: 4 Ratio
VLDL: 17 mg/dL (ref 0–40)

## 2012-11-30 MED ORDER — OMEPRAZOLE 20 MG PO CPDR: 20.0000 mg | DELAYED_RELEASE_CAPSULE | Freq: Every day | ORAL | Status: DC

## 2012-11-30 NOTE — Progress Notes (Signed)
  Subjective:    Patient ID: ROSHARD Keith Gibbs, male    DOB: [birth year], 52 y.o.   MRN: [id number]  HPI  Patient presents to same day appointment for indigestion and chest pain.  Pain is located mid-sternum.  He complains of acid reflux during meals and at bedtime.  He has tried Tums without relief.  He does not eat spicy foods or fatty foods.  He drinks one cup of coffee in the AM.  Reflux occurs with every meal.  Symptoms have been going on for several months, but chest pain is worse today.  EKG revealed sinus bradycardia.  He denies any SOB, nausea/vomiting, or sweating at this time.  However, at times, he does endorse shortness of breath which he thinks could be related COPD.  He is a former smoker - quit one year ago.  Patient has never had an MI - but strong family hx - father died from a massive MI.  On a side note, patient would like a referral to Urology.  He has had problems with swollen testicles in the past.  US revealed: 1. Normal-appearing testes. No evidence of testicular torsion.  2. No evidence of epididymo-orchitis.  3. Very small right hydrocele.  4. Small left varicocele.  Now, patient is complaining erection without ejaculation.  No semen comes out.  Review of Systems Per HPI    Objective:   Physical Exam  Constitutional: He appears well-nourished. No distress.  Neck: Normal range of motion.  Cardiovascular: Normal rate and regular rhythm.   Pulmonary/Chest: Effort normal and breath sounds normal.  Abdominal: Soft. Bowel sounds are normal. He exhibits no distension. There is no tenderness. There is no rebound and no guarding.      Assessment & Plan:

## 2012-11-30 NOTE — Patient Instructions (Addendum)
It was good to see you today, Keith Gibbs. Your EKG was normal, except for a slow heart rate. If you develop worsening chest pain associated with nausea/vomiting, shortness of breath, or sweating, please go to ER.  For acid reflux, start taking Omeprazole everyday and follow instructions below. We will call you with time and date of Urology referral. I will review lab results with you at your next visit. Schedule follow up appointment with me in 4 weeks or sooner as needed.  Diet for Gastroesophageal Reflux Disease, Adult Reflux (acid reflux) is when acid from your stomach flows up into the esophagus. When acid comes in contact with the esophagus, the acid causes irritation and soreness (inflammation) in the esophagus. When reflux happens often or so severely that it causes damage to the esophagus, it is called gastroesophageal reflux disease (GERD). Nutrition therapy can help ease the discomfort of GERD. FOODS OR DRINKS TO AVOID OR LIMIT  Smoking or chewing tobacco. Nicotine is one of the most potent stimulants to acid production in the gastrointestinal tract.  Caffeinated and decaffeinated coffee and black tea.  Regular or low-calorie carbonated beverages or energy drinks (caffeine-free carbonated beverages are allowed).   Strong spices, such as black pepper, white pepper, red pepper, cayenne, curry powder, and chili powder.  Peppermint or spearmint.  Chocolate.  High-fat foods, including meats and fried foods. Extra added fats including oils, butter, salad dressings, and nuts. Limit these to less than 8 tsp per day.  Fruits and vegetables if they are not tolerated, such as citrus fruits or tomatoes.  Alcohol.  Any food that seems to aggravate your condition. If you have questions regarding your diet, call your caregiver or a registered dietitian. OTHER THINGS THAT MAY HELP GERD INCLUDE:   Eating your meals slowly, in a relaxed setting.  Eating 5 to 6 small meals per day instead of  3 large meals.  Eliminating food for a period of time if it causes distress.  Not lying down until 3 hours after eating a meal.  Keeping the head of your bed raised 6 to 9 inches (15 to 23 cm) by using a foam wedge or blocks under the legs of the bed. Lying flat may make symptoms worse.  Being physically active. Weight loss may be helpful in reducing reflux in overweight or obese adults.  Wear loose fitting clothing EXAMPLE MEAL PLAN This meal plan is approximately 2,000 calories based on https://www.bernard.org/ meal planning guidelines. Breakfast   cup cooked oatmeal.  1 cup strawberries.  1 cup low-fat milk.  1 oz almonds. Snack  1 cup cucumber slices.  6 oz yogurt (made from low-fat or fat-free milk). Lunch  2 slice whole-wheat bread.  2 oz sliced [place].  2 tsp mayonnaise.  1 cup blueberries.  1 cup snap peas. Snack  6 whole-wheat crackers.  1 oz string cheese. Dinner   cup brown rice.  1 cup mixed veggies.  1 tsp olive oil.  3 oz grilled fish. Document Released: 07/11/2005 Document Revised: 10/03/2011 Document Reviewed: 05/27/2011 [facility] Patient Information 2013 [place], [place].

## 2012-11-30 NOTE — Assessment & Plan Note (Signed)
EKG revealed sinus bradycardia, but no STEMI.  Pain likely secondary to GERD.  Will check TSH, lipid panel, and A1c for risk stratification today.  Will start low dose Omeprazole for 4 weeks, then patient to return to clinic.  Discussed red flags with patient and per AVS.  Follow up with me in 4 weeks or sooner as needed.

## 2012-11-30 NOTE — Assessment & Plan Note (Signed)
Could be secondary to BPH vs. Psychological.  Because he has had problems with swollen testicles in the past, will refer to Urology for further evaluation.

## 2012-12-03 ENCOUNTER — Encounter: Payer: Self-pay | Admitting: Family Medicine

## 2013-01-03 ENCOUNTER — Ambulatory Visit: Payer: No Typology Code available for payment source | Admitting: Family Medicine

## 2013-02-07 ENCOUNTER — Ambulatory Visit: Payer: Medicaid Other | Attending: Family Medicine | Admitting: Physical Therapy

## 2013-02-07 DIAGNOSIS — IMO0001 Reserved for inherently not codable concepts without codable children: Secondary | ICD-10-CM | POA: Insufficient documentation

## 2013-02-07 DIAGNOSIS — R262 Difficulty in walking, not elsewhere classified: Secondary | ICD-10-CM | POA: Insufficient documentation

## 2013-02-07 DIAGNOSIS — R269 Unspecified abnormalities of gait and mobility: Secondary | ICD-10-CM | POA: Insufficient documentation

## 2013-02-07 DIAGNOSIS — M25559 Pain in unspecified hip: Secondary | ICD-10-CM | POA: Insufficient documentation

## 2013-04-15 ENCOUNTER — Other Ambulatory Visit: Payer: Self-pay | Admitting: Internal Medicine

## 2013-04-15 DIAGNOSIS — R1011 Right upper quadrant pain: Secondary | ICD-10-CM

## 2013-04-22 ENCOUNTER — Ambulatory Visit
Admission: RE | Admit: 2013-04-22 | Discharge: 2013-04-22 | Disposition: A | Discharge status: Home / Self Care | Payer: Medicaid Other | Source: Ambulatory Visit | Attending: Internal Medicine | Admitting: Internal Medicine

## 2013-04-22 DIAGNOSIS — R1011 Right upper quadrant pain: Secondary | ICD-10-CM

## 2013-05-27 DIAGNOSIS — M791 Myalgia, unspecified site: Secondary | ICD-10-CM | POA: Insufficient documentation

## 2013-05-27 DIAGNOSIS — F319 Bipolar disorder, unspecified: Secondary | ICD-10-CM | POA: Insufficient documentation

## 2013-08-01 ENCOUNTER — Other Ambulatory Visit: Payer: Self-pay | Admitting: Family Medicine

## 2013-09-12 ENCOUNTER — Other Ambulatory Visit: Payer: Self-pay | Admitting: Internal Medicine

## 2013-09-12 DIAGNOSIS — C220 Liver cell carcinoma: Secondary | ICD-10-CM

## 2013-09-18 ENCOUNTER — Ambulatory Visit
Admission: RE | Admit: 2013-09-18 | Discharge: 2013-09-18 | Disposition: A | Discharge status: Home / Self Care | Payer: Medicaid Other | Source: Ambulatory Visit | Attending: Internal Medicine | Admitting: Internal Medicine

## 2013-09-18 DIAGNOSIS — C220 Liver cell carcinoma: Secondary | ICD-10-CM

## 2013-11-25 ENCOUNTER — Ambulatory Visit: Payer: Self-pay | Admitting: Gastroenterology

## 2013-11-27 LAB — PATHOLOGY REPORT

## 2014-01-14 ENCOUNTER — Ambulatory Visit (INDEPENDENT_AMBULATORY_CARE_PROVIDER_SITE_OTHER): Payer: Medicaid Other | Admitting: Neurology

## 2014-01-14 ENCOUNTER — Encounter (INDEPENDENT_AMBULATORY_CARE_PROVIDER_SITE_OTHER): Payer: Self-pay

## 2014-01-14 ENCOUNTER — Encounter: Payer: Self-pay | Admitting: Neurology

## 2014-01-14 VITALS — BP 129/83 | HR 59 | Temp 98.3°F | Ht 75.0 in | Wt 238.0 lb

## 2014-01-14 DIAGNOSIS — R251 Tremor, unspecified: Secondary | ICD-10-CM

## 2014-01-14 DIAGNOSIS — Z8659 Personal history of other mental and behavioral disorders: Secondary | ICD-10-CM

## 2014-01-14 DIAGNOSIS — R259 Unspecified abnormal involuntary movements: Secondary | ICD-10-CM

## 2014-01-14 DIAGNOSIS — G4700 Insomnia, unspecified: Secondary | ICD-10-CM

## 2014-01-14 NOTE — Progress Notes (Signed)
Subjective:    Patient ID: Keith Gibbs is a 53 y.o. male.  HPI    Star Age, MD, PhD [facility] [address], [address] [place], [place] [address]  Dear Dr. Humphrey Rolls,   I saw your patient, Keith Gibbs, upon your kind request in my neurologic clinic today for initial consultation of his bilateral upper extremity tremor, concern for essential tremor. The patient is unaccompanied today. As you know, Keith Gibbs is a 53 year old right-handed gentleman with an underlying medical history of hypertension, smoking, COPD, bipolar disorder, schizophrenia, anxiety, arthritis, hepatitis C, liver disease, and thyroid disease, who has noted a bilateral upper extremity tremor for the past 10 years or longer. He is not aware of any FHx of tremors. This affects his upper body, including head, neck and both arms and hands. Over the years, it has become worse and also fluctuates on a day to day basis. He is going to have a thyroid US soon. His caffeine intake is little, usually one cup in the AM. He denies any alcohol use currently in the last 10 years and no illicit drugs in over 25 years. [facility]. He recently had thyroid function testing including thyroid antibodies with thyroid peroxidase and thyroglobulin antibody testing which was normal, free T4 was in the normal range and so was TSH, T4 and T3. This was done on 01/06/2014 and I reviewed the test results. I also reviewed blood work from 10/31/2013 which included a CMP, CBC without differential, lipid panel, iron and TIBC, vitamin B12 and folate, TSH, all of which were in the normal range with the exception of AST of 65 and ALT of 56. He has a long-standing history of psychiatric illness including voluntary and involuntary admissions. He is a recovering alcoholic. He has a remote history of substance abuse. He is currently on Abilify, Atarax, benadryl, lamictal, risperdal. He took [place]  in the past, which caused severe shaking. He was on it about a year or 2 ago. He does not think the tremor is from any of his medications, as he had the tremor been there before.  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   His Past Medical History Is Significant For: Past Medical History  Diagnosis Date  . Anxiety   . Depression   . Arthritis   . Bursitis   . Hepatitis C 2012    His Past Surgical History Is Significant For: Past Surgical History  Procedure Laterality Date  . Abdominal surgery    . Appendectomy    . Shoulder surgery  04/09/2012    His Family History Is Significant For: Family History  Problem Relation Age of Onset  . Osteoarthritis Mother   . Heart disease Mother   . Hypertension Mother   . Heart failure Father   . Heart disease Father   . Early death Father   . Hypertension Father   . Hypertension Sister     His Social History Is Significant For: History   Social History  . Marital Status: Single    Spouse Name: N/A    Number of Children: N/A  . Years of Education: N/A   Social History Main Topics  . Smoking status: Former Smoker -- 1.00 packs/day    Quit date: 03/04/2012  . Smokeless tobacco: None  . Alcohol Use: Yes  . Drug Use: No  . Sexual Activity:  Other Topics Concern  . None   Social History Narrative  . None    His Allergies Are:  Allergies  Allergen Reactions  . Latuda [Lurasidone Hcl]     tremor  . Naproxen Hives and Swelling  . Tramadol Hives and Swelling  :   His Current Medications Are:  Outpatient Encounter Prescriptions as of 01/14/2014  Medication Sig  . acetaminophen (TYLENOL) 500 MG tablet Take 1,000 mg by mouth every 6 (six) hours as needed. For pain.  Marland Kitchen albuterol (PROAIR HFA) 108 (90 BASE) MCG/ACT inhaler Inhale 1 puff into the lungs 2 (two) times daily.  . Fluticasone-Salmeterol (ADVAIR DISKUS) 250-50 MCG/DOSE AEPB Inhale 1 puff into the lungs 2 (two)  times daily.  . hydrOXYzine (ATARAX/VISTARIL) 50 MG tablet Take 50 mg by mouth 2 (two) times daily as needed for itching.  Marland Kitchen ibuprofen (ADVIL,MOTRIN) 800 MG tablet Take 800 mg by mouth 2 (two) times daily.  Marland Kitchen lamoTRIgine (LAMICTAL) 100 MG tablet Take 100 mg by mouth at bedtime.  Marland Kitchen omeprazole (PRILOSEC) 20 MG capsule Take 20 mg by mouth daily.  . risperiDONE (RISPERDAL) 0.5 MG tablet Take 0.5 mg by mouth at bedtime.  . diphenhydrAMINE (BENADRYL) 25 mg capsule Take 1 capsule (25 mg total) by mouth every 6 (six) hours as needed (atypical movements, tremors).  . [DISCONTINUED] albuterol (PROVENTIL HFA;VENTOLIN HFA) 108 (90 BASE) MCG/ACT inhaler Inhale 2 puffs into the lungs every 6 (six) hours as needed. For shortness of breath.  . [DISCONTINUED] diclofenac (VOLTAREN) 75 MG EC tablet Take 75 mg by mouth 2 (two) times daily.  . [DISCONTINUED] divalproex (DEPAKOTE) 500 MG DR tablet Take 500 mg by mouth 2 (two) times daily.  . [DISCONTINUED] Fluticasone-Salmeterol (ADVAIR) 250-50 MCG/DOSE AEPB Inhale 1 puff into the lungs every 12 (twelve) hours.  . [DISCONTINUED] gabapentin (NEURONTIN) 400 MG capsule Take 400 mg by mouth 2 (two) times daily.  . [DISCONTINUED] HYDROcodone-acetaminophen (NORCO/VICODIN) 5-325 MG per tablet Take 2 tablets by mouth every 6 (six) hours as needed for pain.  . [DISCONTINUED] lidocaine (LIDODERM) 5 % Place 1 patch onto the skin daily. Remove & Discard patch within 12 hours or as directed by MD  . [DISCONTINUED] lurasidone (LATUDA) 40 MG TABS Take 40 mg by mouth daily with breakfast.  . [DISCONTINUED] omeprazole (PRILOSEC) 20 MG capsule Take 1 capsule (20 mg total) by mouth daily.  :   Review of Systems:  Out of a complete 14 point review of systems, all are reviewed and negative with the exception of these symptoms as listed below:   Review of Systems  Constitutional: Positive for activity change, fatigue and unexpected weight change (loss).  HENT: Positive for tinnitus and  trouble swallowing.   Eyes: Negative.   Respiratory: Positive for cough and shortness of breath.   Cardiovascular: Negative.   Gastrointestinal:       Incontinence  Endocrine: Positive for polydipsia.  Genitourinary: Negative.   Musculoskeletal: Positive for arthralgias, joint swelling and myalgias.       Muscle cramps  Skin: Negative.   Allergic/Immunologic: Positive for environmental allergies.  Neurological: Positive for dizziness, tremors and weakness.       Memory loss  Hematological: Bruises/bleeds easily.  Psychiatric/Behavioral: Positive for sleep disturbance (not enough sleep), dysphoric mood and decreased concentration. The patient is nervous/anxious.     Objective:  Neurologic Exam  Physical Exam Physical Examination:   Filed Vitals:   01/14/14 1356  BP: 129/83  Pulse: 59  Temp: 98.3 F (36.8 C)   General  Examination: The patient is a very pleasant 53 y.o. male in no acute distress. He appears well-developed and well-nourished and adequately groomed.   HEENT: Normocephalic, atraumatic, pupils are equal, round and reactive to light and accommodation. Funduscopic exam is normal with sharp disc margins noted. Extraocular tracking is good without limitation to gaze excursion or nystagmus noted. Normal smooth pursuit is noted. Hearing is grossly intact. Face is symmetric with normal facial animation and normal facial sensation. Speech is clear with no dysarthria noted. There is no hypophonia. There is a mild intermittent low amplitude neck and no tremor. Neck is supple with full range of passive and active motion. There are no carotid bruits on auscultation. Oropharynx exam reveals: moderate mouth dryness, he is edentulous. Mallampati is class II. Tongue protrudes centrally and palate elevates symmetrically. He has   Chest: Clear to auscultation without wheezing, rhonchi or crackles noted.  Heart: S1+S2+0, regular and normal without murmurs, rubs or gallops noted.    Abdomen: Soft, non-tender and non-distended with normal bowel sounds appreciated on auscultation.  Extremities: There is no pitting edema in the distal lower extremities bilaterally. Pedal pulses are intact.  Skin: Warm and dry without trophic changes noted. There are no varicose veins.  Musculoskeletal: exam reveals no obvious joint deformities, tenderness or joint swelling or erythema.   Neurologically:  Mental status: The patient is awake, alert and oriented in all 4 spheres. His immediate and remote memory, attention, language skills and fund of knowledge are appropriate. There is no evidence of aphasia, agnosia, apraxia or anomia. Speech is clear with normal prosody and enunciation. Thought process is linear. Mood is normal and affect is blunted.  Cranial nerves II - XII are as described above under HEENT exam. In addition: shoulder shrug is normal with equal shoulder height noted. Motor exam: Normal bulk, strength and tone is noted. There is no drift, or rest tremor or rebound. There is a bilateral upper extremity postural and action tremor, which is mild in degree. There tremor frequency is fairly fast and the amplitude is small. On Archimedes spiral drawing there is moderate tremulousness noted. Handwriting is moderately tremulous, but legible. There is no evidence of micrographia.  Romberg is negative. Reflexes are 2+ throughout. Babinski: Toes are flexor bilaterally. Fine motor skills and coordination: intact with normal finger taps, normal hand movements, normal rapid alternating patting, normal foot taps and normal foot agility.  Cerebellar testing: No dysmetria or intention tremor on finger to nose testing. Heel to shin is unremarkable bilaterally. There is no truncal or gait ataxia.  Sensory exam: intact to light touch, pinprick, vibration, temperature sense in the upper and lower extremities.  Gait, station and balance: He stands with mild difficulty. No veering to one side is  noted. No leaning to one side is noted. Posture is age-appropriate and stance is narrow based. Gait shows normal stride length and normal pace. No problems turning are noted. He turns en bloc.                Assessment and Plan:   In summary, KEYANDRE PILEGGI is a very pleasant 53 y.o.-year old male with an underlying complex medical history of hypertension, smoking, COPD, bipolar disorder, schizophrenia, anxiety, arthritis, hepatitis C, liver disease, and thyroid disease, who has noted a bilateral upper extremity tremor for the past 10 years or longer. He denies any family history of tremor and feels his tremor has been progressive. His physical exam and history are in keeping with essential tremor versus enhanced  physiological tremor versus drug-induced tremor. I think there is a chance that he has essential tremor. However, I think symptomatic treatment for tremor will be rather difficult for him. Because of his chronic liver disease I do not think he is a candidate to try Mysoline. Topamax may have interaction with his psychiatric medication. A beta blocker may exacerbate depression and lower his pulse rate further. I had a long discussion with the patient regarding his symptoms and symptomatic tremor treatment. We also talked about tremor exacerbating instances or triggers such as nervousness, stress, dehydration, poor sleep. He is in the process of trying to move out of his current living situation which is problematic. He is furthermore advised to keep really well hydrated. He endorses not drinking enough water. Unfortunately a benzodiazepine is not a good choice given his history of substance abuse and benzodiazepines in general are not good long-term medications. He does admit to trying a Valium from his friend which helped calm down his tremor. He is in the process of getting further workup for his thyroid dysfunction. He has never had a brain MRI. I will order one. He has an otherwise nonfocal exam. I  reassured them in that regard. In particular I do not detect any parkinsonism. Unfortunately, I not believe that there is a whole lot I can add at this time. We will call him with his MRI brain results. I explained my findings and my reasoning to him in detail today. He demonstrated understanding.  Thank you very much for allowing me to participate in the care of this nice patient. If I can be of any further assistance to you please do not hesitate to call me at [phone].  Sincerely,   Star Age, MD, PhD

## 2014-01-14 NOTE — Patient Instructions (Signed)
  Please remember, that any kind of tremor may be exacerbated by anxiety, anger, nervousness, excitement, dehydration, sleep deprivation, by caffeine, and low blood sugar values or blood sugar fluctuations. Some medications, especially some antidepressants and lithium can cause or exacerbate tremors. Tremors may temporarily calm down her subside with the use of a benzodiazepine such as Valium or related medications and with alcohol. Be aware however that drinking alcohol is not an approved treatment or appropriate treatment for tremor control and long-term use of benzodiazepines such as Valium, lorazepam, alprazolam, or clonazepam can cause habit formation, physical and psychological addiction.  We may not have a lot of options to treat your tremor.

## 2014-01-17 ENCOUNTER — Encounter (HOSPITAL_COMMUNITY): Payer: Self-pay | Admitting: Emergency Medicine

## 2014-01-17 ENCOUNTER — Emergency Department (HOSPITAL_COMMUNITY)
Admission: EM | Admit: 2014-01-17 | Discharge: 2014-01-17 | Disposition: A | Discharge status: Home / Self Care | Payer: Medicaid Other | Attending: Emergency Medicine | Admitting: Emergency Medicine

## 2014-01-17 ENCOUNTER — Emergency Department (HOSPITAL_COMMUNITY): Payer: Medicaid Other

## 2014-01-17 DIAGNOSIS — Z8619 Personal history of other infectious and parasitic diseases: Secondary | ICD-10-CM | POA: Insufficient documentation

## 2014-01-17 DIAGNOSIS — J441 Chronic obstructive pulmonary disease with (acute) exacerbation: Secondary | ICD-10-CM | POA: Insufficient documentation

## 2014-01-17 DIAGNOSIS — F411 Generalized anxiety disorder: Secondary | ICD-10-CM | POA: Insufficient documentation

## 2014-01-17 DIAGNOSIS — IMO0002 Reserved for concepts with insufficient information to code with codable children: Secondary | ICD-10-CM | POA: Insufficient documentation

## 2014-01-17 DIAGNOSIS — F329 Major depressive disorder, single episode, unspecified: Secondary | ICD-10-CM | POA: Insufficient documentation

## 2014-01-17 DIAGNOSIS — J40 Bronchitis, not specified as acute or chronic: Secondary | ICD-10-CM

## 2014-01-17 DIAGNOSIS — Z79899 Other long term (current) drug therapy: Secondary | ICD-10-CM | POA: Insufficient documentation

## 2014-01-17 DIAGNOSIS — F3289 Other specified depressive episodes: Secondary | ICD-10-CM | POA: Insufficient documentation

## 2014-01-17 DIAGNOSIS — Z87891 Personal history of nicotine dependence: Secondary | ICD-10-CM | POA: Insufficient documentation

## 2014-01-17 DIAGNOSIS — M129 Arthropathy, unspecified: Secondary | ICD-10-CM | POA: Insufficient documentation

## 2014-01-17 DIAGNOSIS — R1110 Vomiting, unspecified: Secondary | ICD-10-CM | POA: Insufficient documentation

## 2014-01-17 DIAGNOSIS — Z791 Long term (current) use of non-steroidal anti-inflammatories (NSAID): Secondary | ICD-10-CM | POA: Insufficient documentation

## 2014-01-17 MED ORDER — BENZONATATE 100 MG PO CAPS
200.0000 mg | ORAL_CAPSULE | Freq: Once | ORAL | Status: AC
  Administered 2014-01-17: 200 mg via ORAL
  Filled 2014-01-17: qty 2

## 2014-01-17 MED ORDER — AZITHROMYCIN 250 MG PO TABS: ORAL_TABLET | ORAL | Status: DC

## 2014-01-17 MED ORDER — HYDROCODONE-ACETAMINOPHEN 7.5-325 MG/15ML PO SOLN: 15.0000 mL | Freq: Four times a day (QID) | ORAL | Status: DC | PRN

## 2014-01-17 MED ORDER — PREDNISONE 20 MG PO TABS
60.0000 mg | ORAL_TABLET | Freq: Once | ORAL | Status: AC
  Administered 2014-01-17: 60 mg via ORAL
  Filled 2014-01-17: qty 3

## 2014-01-17 MED ORDER — PREDNISONE 20 MG PO TABS: 40.0000 mg | ORAL_TABLET | Freq: Every day | ORAL | Status: DC

## 2014-01-17 MED ORDER — AZITHROMYCIN 250 MG PO TABS
500.0000 mg | ORAL_TABLET | Freq: Once | ORAL | Status: AC
  Administered 2014-01-17: 500 mg via ORAL
  Filled 2014-01-17: qty 2

## 2014-01-17 NOTE — Discharge Instructions (Signed)
°  Take your antibiotics as directed and to completion. You should never have any leftover antibiotics! Push fluids and stay well hydrated.   Take Hycet  for cough and pain. Do not drink alcohol, drive, care for children or do other critical tasks while taking Hycet.   Please follow with your primary care doctor in the next 2 days for a check-up. They must obtain records for further management.   Do not hesitate to return to the Emergency Department for any new, worsening or concerning symptoms.

## 2014-01-17 NOTE — ED Provider Notes (Signed)
CSN: [id number]     Arrival date & time 01/17/14  1255 History  This chart was scribed for Keith Blitz, PA, working with Janice Norrie, MD, by Elby Beck ED Scribe. This patient was seen in room WTR7/WTR7 and the patient's care was started at 1:22 PM.   Chief Complaint  Patient presents with  . Sore Throat  . Nasal Congestion  . Cough    The history is provided by the patient. No language interpreter was used.    HPI Comments: Keith Gibbs is a 53 y.o. male with a past medical history of COPD who presents to the Emergency Department complaining of a sore throat over the past 5 days. He reports having constant, moderate pain in his throat. He reports associated productive cough, SOB, rhinorrhea and generalized headache. He also states that he had an episode of post-tussive emesis lat night. He further states that he has had an associated low grade fever. He states that he took his temperature to be 100 F last night. ED temperature is 98.3 F. He states that he has tried cough drops and Benadryl without relief of his symptoms. He states that he has not tried any cough suppressants. He denies abdominal pain or any other symptoms. He uses Albuterol and Advair inhalers daily due to his history of COPD. He states that he is driving today. He states that he has taken Hydrocodone in the past, and he did not have any allergic reactions to this.    Past Medical History  Diagnosis Date  . Anxiety   . Depression   . Arthritis   . Bursitis   . Hepatitis C 2012   Past Surgical History  Procedure Laterality Date  . Abdominal surgery    . Appendectomy    . Shoulder surgery  04/09/2012   Family History  Problem Relation Age of Onset  . Osteoarthritis Mother   . Heart disease Mother   . Hypertension Mother   . Heart failure Father   . Heart disease Father   . Early death Father   . Hypertension Father   . Hypertension Sister    History  Substance Use Topics  . Smoking status: Former  Smoker -- 1.00 packs/day    Quit date: 03/04/2012  . Smokeless tobacco: Not on file  . Alcohol Use: Yes    Review of Systems  Constitutional: Positive for fever.  HENT: Positive for rhinorrhea and sore throat.   Respiratory: Positive for cough and shortness of breath.   Cardiovascular: Negative for chest pain.  Gastrointestinal: Positive for vomiting (post-tussive). Negative for nausea, abdominal pain and diarrhea.  Neurological: Positive for headaches.  All other systems reviewed and are negative.   Allergies  Latuda; Naproxen; Saphris; and Tramadol  Home Medications   Prior to Admission medications   Medication Sig Start Date End Date Taking? Authorizing Roney Youtz  acetaminophen (TYLENOL) 500 MG tablet Take 1,000 mg by mouth every 6 (six) hours as needed. For pain.   Yes Historical Vuk Skillern, MD  albuterol (PROAIR HFA) 108 (90 BASE) MCG/ACT inhaler Inhale 2 puffs into the lungs every 6 (six) hours as needed for wheezing or shortness of breath.  05/28/12  Yes Historical Ledon Weihe, MD  ARIPiprazole (ABILIFY) 5 MG tablet Take 5 mg by mouth at bedtime.   Yes Historical Gerrick Ray, MD  busPIRone (BUSPAR) 10 MG tablet Take 10 mg by mouth 2 (two) times daily.   Yes Historical Johnica Armwood, MD  doxepin (SINEQUAN) 25 MG capsule Take 25 mg by  mouth at bedtime.   Yes Historical Jaiden Dinkins, MD  Fluticasone-Salmeterol (ADVAIR DISKUS) 250-50 MCG/DOSE AEPB Inhale 1 puff into the lungs 2 (two) times daily. 03/20/12  Yes Historical Cay Kath, MD  hydrOXYzine (ATARAX/VISTARIL) 50 MG tablet Take 50 mg by mouth 2 (two) times daily as needed for itching.   Yes Historical Niketa Turner, MD  ibuprofen (ADVIL,MOTRIN) 800 MG tablet Take 800 mg by mouth 2 (two) times daily. 06/07/12  Yes Historical Tunisha Ruland, MD  lamoTRIgine (LAMICTAL) 100 MG tablet Take 200 mg by mouth at bedtime.    Yes Historical Phillippe Orlick, MD  omeprazole (PRILOSEC) 20 MG capsule Take 20 mg by mouth daily.   Yes Historical Setsuko Robins, MD  risperiDONE  (RISPERDAL) 0.5 MG tablet Take 0.5 mg by mouth at bedtime.   Yes Historical Taylin Leder, MD  azithromycin (ZITHROMAX Z-PAK) 250 MG tablet 2 po day one, then 1 daily x 4 days 01/17/14   Elmyra Ricks Pisciotta, PA-C  HYDROcodone-acetaminophen (HYCET) 7.5-325 mg/15 ml solution Take 15 mLs by mouth every 6 (six) hours as needed. 01/17/14   Nicole Pisciotta, PA-C  predniSONE (DELTASONE) 20 MG tablet Take 2 tablets (40 mg total) by mouth daily. 01/17/14   Nicole Pisciotta, PA-C   Triage Vitals: BP 133/74  Pulse 60  Temp(Src) 98.3 F (36.8 C) (Oral)  Resp 18  SpO2 99%  Physical Exam  Nursing note and vitals reviewed. Constitutional: He is oriented to person, place, and time. He appears well-developed and well-nourished. No distress.  HENT:  Head: Normocephalic.  Eyes: Conjunctivae and EOM are normal.  Cardiovascular: Normal rate.   Pulmonary/Chest: Effort normal. No stridor.  Musculoskeletal: Normal range of motion.  Neurological: He is alert and oriented to person, place, and time.  Psychiatric: He has a normal mood and affect.    ED Course  Procedures (including critical care time)  DIAGNOSTIC STUDIES: Oxygen Saturation is 99% on RA, normal by my interpretation.    COORDINATION OF CARE: 1:26 PM- Will order a CXR. Discussed clinical suspicion that pt has sinusitis. Discussed plan to prescribe medications. Pt states that he is driving. Pt advised of plan for treatment and pt agrees.  Labs Review Labs Reviewed - No data to display  Imaging Review Dg Chest 2 View  01/17/2014   CLINICAL DATA:  Chest pain. Short of breath. Cough. Nasal congestion.  EXAM: CHEST  2 VIEW  COMPARISON:  11/04/2011  FINDINGS: Heart size is normal. Mediastinal shadows are normal. There is chronic bronchial thickening and there are chronic increased interstitial markings. Pleural and parenchymal scarring remains evident at both apices. No consolidation, collapse or effusion. No significant bony finding.  IMPRESSION: Chronic  bronchial thickening. Chronic abnormal interstitial marking. Chronic pleural and parenchymal scarring at the apices. No new finding. Findings could go along with asthma or chronic lung disease.   Electronically Signed   By: Nelson Chimes M.D.   On: 01/17/2014 14:31     EKG Interpretation None      MDM   Final diagnoses:  None    Filed Vitals:   01/17/14 1303 01/17/14 1450  BP: 133/74 118/75  Pulse: 60 60  Temp: 98.3 F (36.8 C) 98.3 F (36.8 C)  TempSrc: Oral Oral  Resp: 18 18  SpO2: 99% 100%    Medications  predniSONE (DELTASONE) tablet 60 mg (60 mg Oral Given 01/17/14 1333)  benzonatate (TESSALON) capsule 200 mg (200 mg Oral Given 01/17/14 1333)  azithromycin (ZITHROMAX) tablet 500 mg (500 mg Oral Given 01/17/14 1451)    Keith Gibbs is  a 53 y.o. male presenting with cough, pleuritic chest pain, runny nose. Patient has COPD and reports change in sputum. Chest x-ray with no infiltrate. Patient will be started on steroid burst and a Z-Pak. Return precautions discussed.   Evaluation does not show pathology that would require ongoing emergent intervention or inpatient treatment. Pt is hemodynamically stable and mentating appropriately. Discussed findings and plan with patient/guardian, who agrees with care plan. All questions answered. Return precautions discussed and outpatient follow up given.   Discharge Medication List as of 01/17/2014  2:38 PM    START taking these medications   Details  azithromycin (ZITHROMAX Z-PAK) 250 MG tablet 2 po day one, then 1 daily x 4 days, Print    HYDROcodone-acetaminophen (HYCET) 7.5-325 mg/15 ml solution Take 15 mLs by mouth every 6 (six) hours as needed., Starting 01/17/2014, Until Discontinued, Print    predniSONE (DELTASONE) 20 MG tablet Take 2 tablets (40 mg total) by mouth daily., Starting 01/17/2014, Until Discontinued, Print            I personally performed the services described in this documentation, which was scribed in my  presence. The recorded information has been reviewed and is accurate.   Keith Blitz, PA-C 01/17/14 1651

## 2014-01-17 NOTE — ED Notes (Addendum)
Pt reports that he developed a sore throat five days ago, which then four days ago he developed nasal congestion and generalized body aches, then three days ago he developed a productive cough with yellow sputum and chest congestion. Pt is A/O x4, in NAD, and vitals are WDL.

## 2014-01-18 NOTE — ED Provider Notes (Signed)
Medical screening examination/treatment/procedure(s) were performed by non-physician practitioner and as supervising physician I was immediately available for consultation/collaboration.   EKG Interpretation None      Rolland Porter, MD, Abram Sander   Janice Norrie, MD 01/18/14 [phone]

## 2014-01-28 ENCOUNTER — Inpatient Hospital Stay: Admission: RE | Admit: 2014-01-28 | Payer: Medicaid Other | Source: Ambulatory Visit

## 2014-01-30 ENCOUNTER — Emergency Department (HOSPITAL_COMMUNITY): Payer: Medicaid Other

## 2014-01-30 ENCOUNTER — Emergency Department (HOSPITAL_COMMUNITY)
Admission: EM | Admit: 2014-01-30 | Discharge: 2014-01-30 | Disposition: A | Discharge status: Home / Self Care | Payer: Medicaid Other | Attending: Emergency Medicine | Admitting: Emergency Medicine

## 2014-01-30 ENCOUNTER — Encounter (HOSPITAL_COMMUNITY): Payer: Self-pay | Admitting: Emergency Medicine

## 2014-01-30 DIAGNOSIS — Z79899 Other long term (current) drug therapy: Secondary | ICD-10-CM | POA: Insufficient documentation

## 2014-01-30 DIAGNOSIS — Z792 Long term (current) use of antibiotics: Secondary | ICD-10-CM | POA: Insufficient documentation

## 2014-01-30 DIAGNOSIS — Z87891 Personal history of nicotine dependence: Secondary | ICD-10-CM | POA: Insufficient documentation

## 2014-01-30 DIAGNOSIS — M79671 Pain in right foot: Secondary | ICD-10-CM

## 2014-01-30 DIAGNOSIS — S99919A Unspecified injury of unspecified ankle, initial encounter: Secondary | ICD-10-CM

## 2014-01-30 DIAGNOSIS — S8990XA Unspecified injury of unspecified lower leg, initial encounter: Secondary | ICD-10-CM | POA: Diagnosis not present

## 2014-01-30 DIAGNOSIS — S2249XA Multiple fractures of ribs, unspecified side, initial encounter for closed fracture: Secondary | ICD-10-CM | POA: Diagnosis not present

## 2014-01-30 DIAGNOSIS — Z8619 Personal history of other infectious and parasitic diseases: Secondary | ICD-10-CM | POA: Insufficient documentation

## 2014-01-30 DIAGNOSIS — M25561 Pain in right knee: Secondary | ICD-10-CM

## 2014-01-30 DIAGNOSIS — S2231XA Fracture of one rib, right side, initial encounter for closed fracture: Secondary | ICD-10-CM

## 2014-01-30 DIAGNOSIS — IMO0002 Reserved for concepts with insufficient information to code with codable children: Secondary | ICD-10-CM | POA: Insufficient documentation

## 2014-01-30 DIAGNOSIS — S298XXA Other specified injuries of thorax, initial encounter: Secondary | ICD-10-CM | POA: Diagnosis present

## 2014-01-30 DIAGNOSIS — Y9389 Activity, other specified: Secondary | ICD-10-CM | POA: Diagnosis not present

## 2014-01-30 DIAGNOSIS — S99929A Unspecified injury of unspecified foot, initial encounter: Secondary | ICD-10-CM

## 2014-01-30 DIAGNOSIS — F3289 Other specified depressive episodes: Secondary | ICD-10-CM | POA: Insufficient documentation

## 2014-01-30 DIAGNOSIS — F411 Generalized anxiety disorder: Secondary | ICD-10-CM | POA: Diagnosis not present

## 2014-01-30 DIAGNOSIS — F329 Major depressive disorder, single episode, unspecified: Secondary | ICD-10-CM | POA: Diagnosis not present

## 2014-01-30 DIAGNOSIS — Y92410 Unspecified street and highway as the place of occurrence of the external cause: Secondary | ICD-10-CM | POA: Diagnosis not present

## 2014-01-30 DIAGNOSIS — M129 Arthropathy, unspecified: Secondary | ICD-10-CM | POA: Insufficient documentation

## 2014-01-30 MED ORDER — OXYCODONE-ACETAMINOPHEN 5-325 MG PO TABS: 1.0000 | ORAL_TABLET | ORAL | Status: DC | PRN

## 2014-01-30 MED ORDER — OXYCODONE-ACETAMINOPHEN 5-325 MG PO TABS
2.0000 | ORAL_TABLET | Freq: Once | ORAL | Status: AC
  Administered 2014-01-30: 2 via ORAL
  Filled 2014-01-30: qty 2
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
## 2014-01-30 NOTE — ED Notes (Signed)
Wound care provided, IS teaching provided, pt verbalized and demonstrated understanding.

## 2014-01-30 NOTE — ED Notes (Signed)
Pt ambulatory with steady gait to radiology 

## 2014-02-02 NOTE — ED Provider Notes (Signed)
Medical screening examination/treatment/procedure(s) were performed by non-physician practitioner and as supervising physician I was immediately available for consultation/collaboration.   EKG Interpretation None        Arbie Cookey, MD 02/02/14 2007

## 2014-02-08 ENCOUNTER — Ambulatory Visit: Payer: Self-pay

## 2014-03-21 ENCOUNTER — Emergency Department: Payer: Self-pay | Admitting: Emergency Medicine

## 2014-03-21 ENCOUNTER — Other Ambulatory Visit: Payer: Self-pay | Admitting: Nurse Practitioner

## 2014-03-21 DIAGNOSIS — K7460 Unspecified cirrhosis of liver: Secondary | ICD-10-CM

## 2014-03-21 DIAGNOSIS — B182 Chronic viral hepatitis C: Secondary | ICD-10-CM

## 2014-03-28 ENCOUNTER — Ambulatory Visit
Admission: RE | Admit: 2014-03-28 | Discharge: 2014-03-28 | Disposition: A | Discharge status: Home / Self Care | Payer: Medicaid Other | Source: Ambulatory Visit | Attending: Nurse Practitioner | Admitting: Nurse Practitioner

## 2014-03-28 DIAGNOSIS — B182 Chronic viral hepatitis C: Secondary | ICD-10-CM

## 2014-03-28 DIAGNOSIS — K7460 Unspecified cirrhosis of liver: Secondary | ICD-10-CM

## 2014-04-23 ENCOUNTER — Ambulatory Visit: Payer: Self-pay | Admitting: Internal Medicine

## 2014-06-05 ENCOUNTER — Ambulatory Visit: Payer: Self-pay

## 2014-09-22 ENCOUNTER — Other Ambulatory Visit (HOSPITAL_COMMUNITY): Payer: Self-pay | Admitting: Nurse Practitioner

## 2014-09-22 DIAGNOSIS — B182 Chronic viral hepatitis C: Secondary | ICD-10-CM

## 2014-10-22 ENCOUNTER — Ambulatory Visit (HOSPITAL_COMMUNITY): Admission: RE | Admit: 2014-10-22 | Payer: Medicaid Other | Source: Ambulatory Visit

## 2014-10-22 ENCOUNTER — Ambulatory Visit (HOSPITAL_COMMUNITY)
Admission: RE | Admit: 2014-10-22 | Discharge: 2014-10-22 | Disposition: A | Discharge status: Home / Self Care | Payer: Medicaid Other | Source: Ambulatory Visit | Attending: Nurse Practitioner | Admitting: Nurse Practitioner

## 2014-10-22 DIAGNOSIS — B182 Chronic viral hepatitis C: Secondary | ICD-10-CM

## 2014-10-23 ENCOUNTER — Ambulatory Visit (HOSPITAL_COMMUNITY)
Admission: RE | Admit: 2014-10-23 | Discharge: 2014-10-23 | Disposition: A | Discharge status: Home / Self Care | Payer: Medicaid Other | Source: Ambulatory Visit | Attending: Nurse Practitioner | Admitting: Nurse Practitioner

## 2014-10-23 DIAGNOSIS — B182 Chronic viral hepatitis C: Secondary | ICD-10-CM | POA: Insufficient documentation

## 2014-12-31 ENCOUNTER — Encounter: Payer: Self-pay | Admitting: Emergency Medicine

## 2014-12-31 ENCOUNTER — Emergency Department
Admission: EM | Admit: 2014-12-31 | Discharge: 2014-12-31 | Disposition: A | Discharge status: Home / Self Care | Payer: Medicaid Other | Attending: Emergency Medicine | Admitting: Emergency Medicine

## 2014-12-31 DIAGNOSIS — Z7952 Long term (current) use of systemic steroids: Secondary | ICD-10-CM | POA: Diagnosis not present

## 2014-12-31 DIAGNOSIS — Z79899 Other long term (current) drug therapy: Secondary | ICD-10-CM | POA: Insufficient documentation

## 2014-12-31 DIAGNOSIS — Z7951 Long term (current) use of inhaled steroids: Secondary | ICD-10-CM | POA: Insufficient documentation

## 2014-12-31 DIAGNOSIS — M545 Low back pain, unspecified: Secondary | ICD-10-CM

## 2014-12-31 DIAGNOSIS — Z720 Tobacco use: Secondary | ICD-10-CM | POA: Diagnosis not present

## 2014-12-31 DIAGNOSIS — G8918 Other acute postprocedural pain: Secondary | ICD-10-CM | POA: Diagnosis not present

## 2014-12-31 DIAGNOSIS — Z792 Long term (current) use of antibiotics: Secondary | ICD-10-CM | POA: Insufficient documentation

## 2014-12-31 MED ORDER — OXYCODONE HCL 5 MG PO TABA: 1.0000 | ORAL_TABLET | ORAL | Status: DC | PRN

## 2014-12-31 NOTE — Discharge Instructions (Signed)
Emergency care providers appreciate that many patients coming to [place] are in severe pain and we wish to address their pain in the safest, most responsible manner.  It is important to recognize, however, that the proper treatment of chronic pain differs from that of the pain of injuries and acute illnesses.  Our goal is to provider quality, safe, personalized care and we thank you for giving [place] the opportunity to serve you.  The use of narcotics and related agents for chronic pain syndromes may lead to additional physical and psychological problems.  Nearly as many people die from prescription narcotics each year as die from car crashes.  Additionally, this risk is increased if such prescriptions are obtained from a variety of sources.  Therefore, only your primary care physician or a pain management specialist is able to safely treat such syndromes with narcotic medications long-term.  Documentation revealing such prescriptions have been sought from multiple sources may prohibit [place] from providing a refill or different narcotic medication.  Your name may be checked first through the [place].  This database is a record of controlled substance medication prescriptions that the patient has received.  This has been established by [facility] in an effort to eliminate the dangerous, and often life-threatening, practice of obtaining multiple prescriptions from different medical providers.  If you have a chronic pain syndrome (i.e. chronic headaches, recurrent back or neck pain, dental pain, abdominal or pelvic pain without a specific diagnosis, or neuropathic pain such as fibromyalgia) or recurrent visits for the same condition without an acute diagnosis, you may be treated with non-narcotics and other non-addictive medicines.  Allergic reactions or negative side effects that may be reported by a patient to such medications will not typically lead to the use of a  narcotic analgesic or other controlled substance as an alternative.  Patients managing chronic pain with a personal physician should have provisions in place for breakthrough pain.  If you are in crisis, you should call your physician.  If your physician directs you to the emergency department, please have the doctor call and speak to our attending physician concerning your care.  When patients come to the Emergency Department (ED) with acute medical conditions in which the ED physician feels it is appropriate to prescribe narcotic or sedating pain medication, the physician will prescribe these in very limited quantities.  The amount of these medications will last only until you can see your primary care physician in his/her office.  Any patient who returns to the ED seeking refills should expect only non-narcotic pain medications.  In the event an acute medical condition exists and the emergency physician feels it is necessary that the patient be given a narcotic or sedating medication, a responsible adult driver should be present in the room prior to the medication being given by the nurse.  Prescriptions for narcotic or sedating medications that have been lost, stolen, or expired will NOT be refilled in the ED.  Patients who have chronic pain may receive non-narcotic prescriptions until seen by their primary care physician.  It is every patient's personal responsibility to maintain active prescriptions with his or her primary care physician or specialist. Back Pain, Adult Low back pain is very common. About 1 in 5 people have back pain.The cause of low back pain is rarely dangerous. The pain often gets better over time.About half of people with a sudden onset of back pain feel better in just 2 weeks. About 8 in 10  people feel better by 6 weeks.  CAUSES Some common causes of back pain include:  Strain of the muscles or ligaments supporting the spine.  Wear and tear (degeneration) of the spinal  discs.  Arthritis.  Direct injury to the back. DIAGNOSIS Most of the time, the direct cause of low back pain is not known.However, back pain can be treated effectively even when the exact cause of the pain is unknown.Answering your caregiver's questions about your overall health and symptoms is one of the most accurate ways to make sure the cause of your pain is not dangerous. If your caregiver needs more information, he or she may order lab work or imaging tests (X-rays or MRIs).However, even if imaging tests show changes in your back, this usually does not require surgery. HOME CARE INSTRUCTIONS For many people, back pain returns.Since low back pain is rarely dangerous, it is often a condition that people can learn to [facility] their own.   Remain active. It is stressful on the back to sit or stand in one place. Do not sit, drive, or stand in one place for more than 30 minutes at a time. Take short walks on level surfaces as soon as pain allows.Try to increase the length of time you walk each day.  Do not stay in bed.Resting more than 1 or 2 days can delay your recovery.  Do not avoid exercise or work.Your body is made to move.It is not dangerous to be active, even though your back may hurt.Your back will likely heal faster if you return to being active before your pain is gone.  Pay attention to your body when you bend and lift. Many people have less discomfortwhen lifting if they bend their knees, keep the load close to their bodies,and avoid twisting. Often, the most comfortable positions are those that put less stress on your recovering back.  Find a comfortable position to sleep. Use a firm mattress and lie on your side with your knees slightly bent. If you lie on your back, put a pillow under your knees.  Only take over-the-counter or prescription medicines as directed by your caregiver. Over-the-counter medicines to reduce pain and inflammation are often the most  helpful.Your caregiver may prescribe muscle relaxant drugs.These medicines help dull your pain so you can more quickly return to your normal activities and healthy exercise.  Put ice on the injured area.  Put ice in a plastic bag.  Place a towel between your skin and the bag.  Leave the ice on for 15-20 minutes, 03-04 times a day for the first 2 to 3 days. After that, ice and heat may be alternated to reduce pain and spasms.  Ask your caregiver about trying back exercises and gentle massage. This may be of some benefit.  Avoid feeling anxious or stressed.Stress increases muscle tension and can worsen back pain.It is important to recognize when you are anxious or stressed and learn ways to manage it.Exercise is a great option. SEEK MEDICAL CARE IF:  You have pain that is not relieved with rest or medicine.  You have pain that does not improve in 1 week.  You have new symptoms.  You are generally not feeling well. SEEK IMMEDIATE MEDICAL CARE IF:   You have pain that radiates from your back into your legs.  You develop new bowel or bladder control problems.  You have unusual weakness or numbness in your arms or legs.  You develop nausea or vomiting.  You develop abdominal pain.  You  feel faint. Document Released: 07/11/2005 Document Revised: 01/10/2012 Document Reviewed: 11/12/2013 [facility] Patient Information 2015 [place], [place]. This information is not intended to replace advice given to you by your health care provider. Make sure you discuss any questions you have with your health care provider.

## 2014-12-31 NOTE — ED Provider Notes (Signed)
[facility] Emergency Department Provider Note  ____________________________________________  Time seen: Approximately 3:24 PM  I have reviewed the triage vital signs and the nursing notes.   HISTORY  Chief Complaint Back Pain  Since for evaluation of low back pain. Patient reports having surgery on his back on May 25 postoperative scarring noted. Complains of increased fullness and pain radiating bilaterally.  HPI Keith Gibbs is a 54 y.o. male who presents for the above complaints.   Past Medical History  Diagnosis Date  . Anxiety   . Depression   . Arthritis   . Bursitis   . Hepatitis C 2012    Patient Active Problem List   Diagnosis Date Noted  . Abnormal ejaculation 11/30/2012  . Chest pain, unspecified 11/30/2012  . Hip pain, left 08/29/2012  . Swollen testicle 07/27/2012  . Schizophrenia 06/27/2012  . Osteoarthritis 06/27/2012  . Shoulder pain 06/27/2012  . COPD (chronic obstructive pulmonary disease) 06/27/2012  . Chronic hepatitis C 06/27/2012  . GERD (gastroesophageal reflux disease) 06/27/2012    Past Surgical History  Procedure Laterality Date  . Abdominal surgery    . Appendectomy    . Shoulder surgery  04/09/2012  . Back surgery      Current Outpatient Rx  Name  Route  Sig  Dispense  Refill  . acetaminophen (TYLENOL) 500 MG tablet   Oral   Take 1,000 mg by mouth every 6 (six) hours as needed. For pain.         Marland Kitchen albuterol (PROAIR HFA) 108 (90 BASE) MCG/ACT inhaler   Inhalation   Inhale 2 puffs into the lungs every 6 (six) hours as needed for wheezing or shortness of breath.          . ARIPiprazole (ABILIFY) 5 MG tablet   Oral   Take 5 mg by mouth at bedtime.         Marland Kitchen azithromycin (ZITHROMAX Z-PAK) 250 MG tablet      2 po day one, then 1 daily x 4 days   5 tablet   0   . busPIRone (BUSPAR) 10 MG tablet   Oral   Take 10 mg by mouth 2 (two) times daily.         Marland Kitchen doxepin (SINEQUAN) 25 MG capsule  Oral   Take 25 mg by mouth at bedtime.         . Fluticasone-Salmeterol (ADVAIR DISKUS) 250-50 MCG/DOSE AEPB   Inhalation   Inhale 1 puff into the lungs 2 (two) times daily.         Marland Kitchen HYDROcodone-acetaminophen (HYCET) 7.5-325 mg/15 ml solution   Oral   Take 15 mLs by mouth every 6 (six) hours as needed.   120 mL   0   . hydrOXYzine (ATARAX/VISTARIL) 50 MG tablet   Oral   Take 50 mg by mouth 2 (two) times daily as needed for itching.         Marland Kitchen ibuprofen (ADVIL,MOTRIN) 800 MG tablet   Oral   Take 800 mg by mouth 2 (two) times daily.         Marland Kitchen lamoTRIgine (LAMICTAL) 100 MG tablet   Oral   Take 200 mg by mouth at bedtime.          Marland Kitchen omeprazole (PRILOSEC) 20 MG capsule   Oral   Take 20 mg by mouth daily.         . OxyCODONE HCl, Abuse Deter, (OXAYDO) 5 MG TABA   Oral   Take  1 tablet by mouth every 4 (four) hours as needed.   20 tablet   0   . oxyCODONE-acetaminophen (PERCOCET/ROXICET) 5-325 MG per tablet   Oral   Take 1-2 tablets by mouth every 4 (four) hours as needed for severe pain.   20 tablet   0   . predniSONE (DELTASONE) 20 MG tablet   Oral   Take 2 tablets (40 mg total) by mouth daily.   10 tablet   0   . risperiDONE (RISPERDAL) 0.5 MG tablet   Oral   Take 0.5 mg by mouth at bedtime.           Allergies Latuda; Naproxen; Saphris; and Tramadol  Family History  Problem Relation Age of Onset  . Osteoarthritis Mother   . Heart disease Mother   . Hypertension Mother   . Heart failure Father   . Heart disease Father   . Early death Father   . Hypertension Father   . Hypertension Sister     Social History History  Substance Use Topics  . Smoking status: Current Every Day Smoker -- 1.00 packs/day    Types: Cigarettes    Last Attempt to Quit: 03/04/2012  . Smokeless tobacco: Not on file  . Alcohol Use: No    Review of Systems Constitutional: No fever/chills Eyes: No visual changes. ENT: No sore throat. Cardiovascular: Denies  chest pain. Respiratory: Denies shortness of breath. Gastrointestinal: No abdominal pain.  No nausea, no vomiting.  No diarrhea.  No constipation. Genitourinary: Negative for dysuria. Musculoskeletal: Positive for low back pain status post lumbar drink. Skin: Negative for rash. Neurological: Negative for headaches, focal weakness or numbness.  10-point ROS otherwise negative.  ____________________________________________   PHYSICAL EXAM:  VITAL SIGNS: ED Triage Vitals  Enc Vitals Group     BP 12/31/14 1338 134/75 mmHg     Pulse Rate 12/31/14 1338 61     Resp 12/31/14 1338 18     Temp 12/31/14 1338 98.3 F (36.8 C)     Temp Source 12/31/14 1338 Oral     SpO2 12/31/14 1338 97 %     Weight 12/31/14 1338 247 lb (112.038 kg)     Height 12/31/14 1338 6\' 3"  (1.905 m)     Head Cir --      Peak Flow --      Pain Score 12/31/14 1344 10     Pain Loc --      Pain Edu? --      Excl. in [place]? --     Constitutional: Alert and oriented. Well appearing and in no acute distress. Cardiovascular: Normal rate, regular rhythm. Grossly normal heart sounds.  Good peripheral circulation. Respiratory: Normal respiratory effort.  No retractions. Lungs CTAB. Musculoskeletal: + 8 cm postoperative scarring noted to the lower lumbar spine. No evidence of erythema or drainage. Neurologic:  Normal speech and language. No gross focal neurologic deficits are appreciated. Speech is normal. No gait instability. Skin:  Skin is warm, dry and intact. No rash noted. Psychiatric: Mood and affect are normal. Speech and behavior are normal.  ____________________________________________   LABS (all labs ordered are listed, but only abnormal results are displayed)  Labs Reviewed - No data to display ____________________________________________  EKG  Not applicable ____________________________________________  RADIOLOGY  Deferred ____________________________________________   PROCEDURES  Procedure(s)  performed: None  Critical Care performed: No  ____________________________________________   INITIAL IMPRESSION / ASSESSMENT AND PLAN / ED COURSE  Pertinent labs & imaging results that were available during my  care of the patient were reviewed by me and considered in my medical decision making (see chart for details).  Postoperative pain lumbar spine. Discussed clinical notes pain management refill policy referred back to PCP. [place] controlled substance reporting system reviewed prior to prescriptions. Patient in compliance with treatment. Patient instructed to follow-up with his PCP or neurosurgeon as directed. ____________________________________________   FINAL CLINICAL IMPRESSION(S) / ED DIAGNOSES  Final diagnoses:  Back pain at L4-L5 level      Arlyss Repress, PA-C 12/31/14 1535  Delman Kitten, MD 12/31/14 2036

## 2014-12-31 NOTE — ED Notes (Signed)
Pt reports having surgery to back on the May 25th. States that the pain has gotten worse. Pt states his last dose of pain medication was yesterday. Denies fever. Pt states he called his PCP and can not get an appointment until the 17th. Site looks WNL no drainage from area no bandage on site.

## 2015-04-06 ENCOUNTER — Encounter: Payer: Self-pay | Admitting: Physical Therapy

## 2015-04-06 ENCOUNTER — Ambulatory Visit: Payer: Medicaid Other | Attending: Orthopedic Surgery | Admitting: Physical Therapy

## 2015-04-06 DIAGNOSIS — M5441 Lumbago with sciatica, right side: Secondary | ICD-10-CM | POA: Diagnosis not present

## 2015-04-06 DIAGNOSIS — R29898 Other symptoms and signs involving the musculoskeletal system: Secondary | ICD-10-CM | POA: Diagnosis present

## 2015-04-06 NOTE — Patient Instructions (Signed)
Extension: Bridging - Supine   Lie with hips and knees bent, feet flat. Lift hips off surface. Repeat _5___ times per set. Do __2__ sets per session. Do _5___ sessions per week.  Copyright  VHI. All rights reserved.

## 2015-04-06 NOTE — Therapy (Signed)
[place] MAIN [facility] SERVICES [address] [place], [place], [address] Phone: [phone]   Fax:  [phone]  Physical Therapy Evaluation  Patient Details  Name: Keith Gibbs MRN: [id number] Date of Birth: Apr 17, 1961 Referring Provider:  Marko Plume, MD  Encounter Date: 04/06/2015      PT End of Session - 04/06/15 1116    Visit Number 1   Number of Visits 17   Date for PT Re-Evaluation 06/01/15   PT Start Time 0920   PT Stop Time 1002   PT Time Calculation (min) 42 min   Activity Tolerance Patient tolerated treatment well   Behavior During Therapy [facility] for tasks assessed/performed      Past Medical History  Diagnosis Date  . Anxiety   . Depression   . Arthritis   . Bursitis   . Hepatitis C 2012    Past Surgical History  Procedure Laterality Date  . Abdominal surgery    . Appendectomy    . Shoulder surgery  04/09/2012  . Back surgery      There were no vitals filed for this visit.  Visit Diagnosis:  Midline low back pain with right-sided sciatica - Plan: PT plan of care cert/re-cert  Weakness of right leg - Plan: PT plan of care cert/re-cert      Subjective Assessment - 04/06/15 0930    Subjective Patient is a pleasant 54 year old male that reports to PT with low back pain that radiates into the R LE. States that the pain started approximately 15 year ago.  He reports that the pain has gotten worse over the past couple months. Back surgery in May 2016 with multiple screws/rods and disc replacements. States that he is stiff in the morning and states that the pain gets worse throughout the day.    Limitations Walking;Lifting;Standing;Sitting   How long can you sit comfortably? 1 hour    How long can you stand comfortably? 15-20 minutes    How long can you walk comfortably? able to walk around the block then needs tp sit   Diagnostic tests Per patient report: Spinal fusion and disc replacement of low thoracic spine  through L5   Patient Stated Goals reduce pain, increase walking ability    Currently in Pain? Yes   Pain Score 7    Pain Location Back   Pain Orientation Medial;Lower;Right   Pain Descriptors / Indicators Aching;Dull   Pain Type Chronic pain   Pain Radiating Towards R LE   Pain Onset More than a month ago   Pain Frequency Intermittent   Aggravating Factors  walking, standing    Pain Relieving Factors lying on back or stomach    Effect of Pain on Daily Activities decreased physical activity             [facility] PT Assessment - 04/07/15 0001    Assessment   Medical Diagnosis Lumbar fusion (M43.27)   Onset Date/Surgical Date 12/05/14   Hand Dominance Right   Next MD Visit october with [facility]   Prior Therapy Yes for shoulder. no PT for back    Precautions   Precautions Back   Precaution Booklet Issued No   Precaution Comments Patient reports that he was told no bending/lifting/twisting until December 2016   Patient currently puts on shoes and bend to clean     [place] residence   Living Arrangements Alone   Available Help at Discharge Family;Friend(s)   Type  of Home Mobile home   Home Access Stairs to enter   Entrance Stairs-Number of Steps 10   Entrance Stairs-Rails Right;Left;Can reach both   Home Layout One level   [place] - 4 wheels;Cane - single point;Toilet riser   Prior Function   Vocation On disability   Vocation Requirements not work   Leisure Futures trader, play games on xbox and computer    Cognition   Overall Cognitive Status Within Functional Limits for tasks assessed   Sensation   Light Touch Appears Intact   Additional Comments Patient reports occasional tingling into the R foot. Sensation intact by gross assessment at time of evaluation.    Posture/Postural Control   Posture Comments Decreased lumbar lordosis secondary to and increased forward hear posture noted in sitting and standing    AROM   Overall AROM  Comments Lumber spinal fusion prevents ROM testing on the Lumbar spine. Thoracic extension:10, flexion 40, lateral flexion R 20, L25, Decreased trunk rotation to the L compared to the R.  all LE ROM WFL.    Strength   Overall Strength Comments R knee flexion 4+/5. R ankle DF 4/5. R hip extension 4/5. All other MMT 5/5   Palpation   Palpation comment Patient reports that he has some tenderness at surgical site, especially at the L3 level.    FABER test   findings Positive   Side Right   Comment Pressure felt on the R. No symptoms reported on the L.    Slump test   Findings Positive   Side Right   Comment tingling into the R foot.    Straight Leg Raise   Findings Positive   Side  Right   Comment Pressure felt in low back and radiating into the R hip     Transfers   Comments Patient demonstrated difficulty with rolling R and L. As well as increased difficulty with supine to sit.    Ambulation/Gait   Gait Comments Patient ambulates without AD  and demonstrates decreased stance time on the R LE, decreased step length on the L LE, increased hip hike for R LE swing through.    Standardized Balance Assessment   10 Meter Walk .82m/s (<1.0 m/s indicated increased fall risk and decreased community ambulation. )          Treatment:   Bridges x5   Log roll R and L x2   Verbal instructions to increase ROM with bridge and proper core activation, as well as decreased trunk rotation with roll to R and L and use of UE to initiate roll.                 PT Education - 04/06/15 1119    Education provided Yes   Education Details plan of care, HEP intiated.    Person(s) Educated Patient   Methods Explanation;Demonstration;Verbal cues   Comprehension Verbalized understanding;Returned demonstration;Verbal cues required             PT Long Term Goals - 04/06/15 1205    PT LONG TERM GOAL #1   Title Patient will be independent with HEP to increase LE strength and improve function  by 06/01/15   Baseline does not have a home exercise program.   Time 8   Period Weeks   Status New   PT LONG TERM GOAL #2   Title Patient will increase R LE to 5/5 throughout to improve gait and allow increased ease with stairs 06/01/15   Baseline R  knee flexion 4+/5. R ankle DF 4/5. R hip extension 4/5. All other MMT 5/5   Time 8   Period Weeks   Status New   PT LONG TERM GOAL #3   Title Patient will improve gait speed to 1.0 m/s to reduce fall risk by 06/01/15   Baseline 0.75 m/s (indicating increased risk for falls)   Time 8   Period Weeks   Status New   PT LONG TERM GOAL #4   Title Patient will reports a worst pain of 4/10 in low back over last week as to demonstrate increased tolerance with ADLs by 06/01/15   Baseline 7/10 pain currently   Time 8   Period Weeks   Status New               Plan - 04/06/15 1148    Clinical Impression Statement This patient is a 54 year old male that reports to PT with low back pain with radicular symptoms down the R LE secondary to Lumbar spinal fusion in May 2016. Patient demonstrates decrease strength in the R LE compared to the L LE. Patient also tests positive for nerve irritation from the lumbar spine as evidenced in positive R sided SLR and slump test with radicular symptoms. Gait was also found to be impaired with deviations noted in gait mechanics as well as decreased gait speed. Bed mobility and transfers were arduous with increased pain in the back and R LE with rolling and supine to sit movements. Based on impairments noted at the time of PT eval, this patient would benefit from skilled PT in order to decrease pain, increase strength, improve gait and improve function with daily tasks and return to PLOF.     Pt will benefit from skilled therapeutic intervention in order to improve on the following deficits Difficulty walking;Decreased activity tolerance;Decreased endurance;Decreased knowledge of precautions;Decreased mobility;Decreased  range of motion;Impaired sensation;Improper body mechanics;Postural dysfunction;Pain;Hypomobility;Increased fascial restricitons   Rehab Potential Fair   Clinical Impairments Affecting Rehab Potential Positive: motivated to improve, good family support. Negative: comorbidities and chronic condition,   PT Frequency 1x / week   PT Duration 8 weeks   PT Treatment/Interventions ADLs/Self Care Home Management;Cryotherapy;Electrical Stimulation;Moist Heat;Gait training;Stair training;Functional mobility training;Therapeutic activities;Therapeutic exercise;Balance training;Neuromuscular re-education;Manual techniques   PT Next Visit Plan Increased HEP, positioning.    PT Home Exercise Plan see patient instructions.          Problem List Patient Active Problem List   Diagnosis Date Noted  . Abnormal ejaculation 11/30/2012  . Chest pain, unspecified 11/30/2012  . Hip pain, left 08/29/2012  . Swollen testicle 07/27/2012  . Schizophrenia 06/27/2012  . Osteoarthritis 06/27/2012  . Shoulder pain 06/27/2012  . COPD (chronic obstructive pulmonary disease) 06/27/2012  . Chronic hepatitis C 06/27/2012  . GERD (gastroesophageal reflux disease) 06/27/2012   Barrie Folk SPT 04/07/2015   9:58 AM  This entire session was performed under direct supervision and direction of a licensed therapist . I have personally read, edited and approve of the note as written.  Hopkins,Margaret PT, DPT 04/07/2015, 9:58 AM  [place] MAIN [facility] SERVICES [address] [place], [place], [address] Phone: [phone]   Fax:  [phone]

## 2015-05-14 ENCOUNTER — Emergency Department: Payer: Medicaid Other

## 2015-05-14 ENCOUNTER — Emergency Department
Admission: EM | Admit: 2015-05-14 | Discharge: 2015-05-14 | Disposition: A | Discharge status: Home / Self Care | Payer: Medicaid Other | Attending: Emergency Medicine | Admitting: Emergency Medicine

## 2015-05-14 ENCOUNTER — Encounter: Payer: Self-pay | Admitting: Emergency Medicine

## 2015-05-14 DIAGNOSIS — Z7951 Long term (current) use of inhaled steroids: Secondary | ICD-10-CM | POA: Diagnosis not present

## 2015-05-14 DIAGNOSIS — G8918 Other acute postprocedural pain: Secondary | ICD-10-CM | POA: Diagnosis not present

## 2015-05-14 DIAGNOSIS — Z79899 Other long term (current) drug therapy: Secondary | ICD-10-CM | POA: Insufficient documentation

## 2015-05-14 DIAGNOSIS — Z7952 Long term (current) use of systemic steroids: Secondary | ICD-10-CM | POA: Insufficient documentation

## 2015-05-14 DIAGNOSIS — Z792 Long term (current) use of antibiotics: Secondary | ICD-10-CM | POA: Insufficient documentation

## 2015-05-14 DIAGNOSIS — M545 Low back pain, unspecified: Secondary | ICD-10-CM

## 2015-05-14 DIAGNOSIS — Z791 Long term (current) use of non-steroidal anti-inflammatories (NSAID): Secondary | ICD-10-CM | POA: Diagnosis not present

## 2015-05-14 DIAGNOSIS — Z720 Tobacco use: Secondary | ICD-10-CM | POA: Diagnosis not present

## 2015-05-14 MED ORDER — OXYCODONE-ACETAMINOPHEN 5-325 MG PO TABS: 2.0000 | ORAL_TABLET | Freq: Once | ORAL | Status: DC

## 2015-05-14 MED ORDER — OXYCODONE-ACETAMINOPHEN 5-325 MG PO TABS
2.0000 | ORAL_TABLET | Freq: Once | ORAL | Status: AC
  Administered 2015-05-14: 2 via ORAL
  Filled 2015-05-14: qty 2

## 2015-05-14 NOTE — Discharge Instructions (Signed)

## 2015-05-14 NOTE — ED Notes (Signed)
MD at bedside for eval.

## 2015-05-14 NOTE — ED Notes (Signed)
MD at bedside for reeval

## 2015-05-14 NOTE — ED Notes (Signed)
Patient reports pain last few days to lower back that radiates into right hip. States approx 5-6 months had back surgery and fell off ladder while painting shutters shortly after (fell approx 3 steps from bottom of ladder). Denies any urinary symptoms or fevers. Ambulatory to triage without difficulty.

## 2015-05-14 NOTE — ED Notes (Signed)
Pt ambulatory to xray.

## 2015-05-14 NOTE — ED Provider Notes (Signed)
[facility] Emergency Department Provider Note  ____________________________________________  Time seen: 4:00 AM  I have reviewed the triage vital signs and the nursing notes.   HISTORY  Chief Complaint Back Pain      HPI Keith Gibbs is a 54 y.o. male presents with low back pain times the past few days that radiates to his right hip. Patient states that he underwent back surgery approximate 5-6 months ago however that while painting he fell approximately 3 steps off a ladder landing on his feet but with resultant low back pain. Patient states current pain score is 8 out of 10. In addition the patient states that he ran out of his Percocet for pain proximally 2 weeks ago. Patient states he will not be seen his primary care provider who prescribes his Percocet until the 18th.     Past Medical History  Diagnosis Date  . Anxiety   . Depression   . Arthritis   . Bursitis   . Hepatitis C 2012    No longer has Hep C    Patient Active Problem List   Diagnosis Date Noted  . Abnormal ejaculation 11/30/2012  . Chest pain, unspecified 11/30/2012  . Hip pain, left 08/29/2012  . Swollen testicle 07/27/2012  . Schizophrenia ([place]) 06/27/2012  . Osteoarthritis 06/27/2012  . Shoulder pain 06/27/2012  . COPD (chronic obstructive pulmonary disease) ([place]) 06/27/2012  . Chronic hepatitis C ([place]) 06/27/2012  . GERD (gastroesophageal reflux disease) 06/27/2012    Past Surgical History  Procedure Laterality Date  . Appendectomy    . Shoulder surgery  04/09/2012  . Back surgery    . Abdominal surgery      Intestines    Current Outpatient Rx  Name  Route  Sig  Dispense  Refill  . acetaminophen (TYLENOL) 500 MG tablet   Oral   Take 1,000 mg by mouth every 6 (six) hours as needed. For pain.         Marland Kitchen albuterol (PROAIR HFA) 108 (90 BASE) MCG/ACT inhaler   Inhalation   Inhale 2 puffs into the lungs every 6 (six) hours as needed for wheezing or shortness of  breath.          . ARIPiprazole (ABILIFY) 5 MG tablet   Oral   Take 5 mg by mouth at bedtime.         Marland Kitchen azithromycin (ZITHROMAX Z-PAK) 250 MG tablet      2 po day one, then 1 daily x 4 days   5 tablet   0   . busPIRone (BUSPAR) 10 MG tablet   Oral   Take 10 mg by mouth 2 (two) times daily.         Marland Kitchen doxepin (SINEQUAN) 25 MG capsule   Oral   Take 25 mg by mouth at bedtime.         . Fluticasone-Salmeterol (ADVAIR DISKUS) 250-50 MCG/DOSE AEPB   Inhalation   Inhale 1 puff into the lungs 2 (two) times daily.         Marland Kitchen HYDROcodone-acetaminophen (HYCET) 7.5-325 mg/15 ml solution   Oral   Take 15 mLs by mouth every 6 (six) hours as needed. Patient not taking: Reported on 04/06/2015   120 mL   0   . hydrOXYzine (ATARAX/VISTARIL) 50 MG tablet   Oral   Take 50 mg by mouth 2 (two) times daily as needed for itching.         Marland Kitchen ibuprofen (ADVIL,MOTRIN) 800 MG tablet  Oral   Take 800 mg by mouth 2 (two) times daily.         Marland Kitchen lamoTRIgine (LAMICTAL) 100 MG tablet   Oral   Take 200 mg by mouth at bedtime.          Marland Kitchen omeprazole (PRILOSEC) 20 MG capsule   Oral   Take 20 mg by mouth daily.         . OxyCODONE HCl, Abuse Deter, (OXAYDO) 5 MG TABA   Oral   Take 1 tablet by mouth every 4 (four) hours as needed.   20 tablet   0   . oxyCODONE-acetaminophen (PERCOCET/ROXICET) 5-325 MG per tablet   Oral   Take 1-2 tablets by mouth every 4 (four) hours as needed for severe pain.   20 tablet   0   . predniSONE (DELTASONE) 20 MG tablet   Oral   Take 2 tablets (40 mg total) by mouth daily.   10 tablet   0   . risperiDONE (RISPERDAL) 0.5 MG tablet   Oral   Take 0.5 mg by mouth at bedtime.           Allergies Latuda; Naproxen; Saphris; and Tramadol  Family History  Problem Relation Age of Onset  . Osteoarthritis Mother   . Heart disease Mother   . Hypertension Mother   . Heart failure Father   . Heart disease Father   . Early death Father   .  Hypertension Father   . Hypertension Sister     Social History Social History  Substance Use Topics  . Smoking status: Current Every Day Smoker -- 0.50 packs/day    Types: Cigarettes    Last Attempt to Quit: 03/04/2012  . Smokeless tobacco: None  . Alcohol Use: No    Review of Systems  Constitutional: Negative for fever. Eyes: Negative for visual changes. ENT: Negative for sore throat. Cardiovascular: Negative for chest pain. Respiratory: Negative for shortness of breath. Gastrointestinal: Negative for abdominal pain, vomiting and diarrhea. Genitourinary: Negative for dysuria. Musculoskeletal: Positive for back pain. Skin: Negative for rash. Neurological: Negative for headaches, focal weakness or numbness.   10-point ROS otherwise negative.  ____________________________________________   PHYSICAL EXAM:  VITAL SIGNS: ED Triage Vitals  Enc Vitals Group     BP 05/14/15 0314 125/73 mmHg     Pulse Rate 05/14/15 0314 50     Resp 05/14/15 0314 16     Temp 05/14/15 0314 98.2 F (36.8 C)     Temp Source 05/14/15 0314 Oral     SpO2 05/14/15 0314 97 %     Weight 05/14/15 0314 250 lb (113.399 kg)     Height 05/14/15 0314 6\' 3"  (1.905 m)     Head Cir --      Peak Flow --      Pain Score 05/14/15 0314 10     Pain Loc --      Pain Edu? --      Excl. in [place]? --      Constitutional: Alert and oriented. Well appearing and in no distress. Eyes: Conjunctivae are normal. PERRL. Normal extraocular movements. ENT   Head: Normocephalic and atraumatic.   Nose: No congestion/rhinnorhea.   Mouth/Throat: Mucous membranes are moist.   Neck: No stridor. Hematological/Lymphatic/Immunilogical: No cervical lymphadenopathy. Cardiovascular: Normal rate, regular rhythm. Normal and symmetric distal pulses are present in all extremities. No murmurs, rubs, or gallops. Respiratory: Normal respiratory effort without tachypnea nor retractions. Breath sounds are clear and equal  bilaterally. No  wheezes/rales/rhonchi. Gastrointestinal: Soft and nontender. No distention. There is no CVA tenderness. Genitourinary: deferred Musculoskeletal: Nontender with normal range of motion in all extremities. No joint effusions.  No lower extremity tenderness nor edema. L4-5 paraspinal pain with palpation. Neurologic:  Normal speech and language. No gross focal neurologic deficits are appreciated. Speech is normal.  Skin:  Skin is warm, dry and intact. No rash noted. Psychiatric: Mood and affect are normal. Speech and behavior are normal. Patient exhibits appropriate insight and judgment.     RADIOLOGY     DG Lumbar Spine Complete (Final result) Result time: 05/14/15 04:49:03   Final result by Rad Results In Interface (05/14/15 04:49:03)   Narrative:   CLINICAL DATA: Fall yesterday, pain waking patient tonight. Status post L4-5 fusion 6 months ago.  EXAM: LUMBAR SPINE - COMPLETE 4+ VIEW  COMPARISON: MRI of the lumbar spine April 23, 2014  FINDINGS: Endplate sclerosis of marginal spurring of L2 superior endplate. L3 anterior superior endplate Schmorl's node. Lumbar vertebral bodies aligned. Status post L4-5 and L5-S1 PLIF, interbody disc prosthesis with partial incorporation. Improved, grade 1 L5-S1 anterolisthesis. Hardware appears intact and well located. No periprosthetic lucency. No pars interarticularis defects though, limited assessment lower lumbar spine.  No destructive bony lesions. Prevertebral and paraspinal soft tissue planes are nonsuspicious.  IMPRESSION: L2 endplate sclerosis favoring degenerative change, less likely acute mild compression fracture. Recommend correlation with point tenderness.  No malalignment.  Status post L4-5 and L5-S1 PLIF.   Electronically Signed By: Elon Alas M.D. On: 05/14/2015 04:49        INITIAL IMPRESSION / ASSESSMENT AND PLAN / ED COURSE  Pertinent labs & imaging results that were  available during my care of the patient were reviewed by me and considered in my medical decision making (see chart for details).  Patient reexamined with no pain to palpation at L2 level. Patient stated that he ran out of his oxycodone approximately 2 weeks ago however review of the patient's narcotic registry revealed that he filled a total of 90 oxycodone and 05/08/2015. When I asked the patient about this prescription and stated that he "forgot the medicines at his mother's house in [place]". I strongly recommended to the patient to follow-up with orthopedic surgeon Dr. Sabra Heck on call if he had continued back pain for possible outpatient MRI.  ____________________________________________   FINAL CLINICAL IMPRESSION(S) / ED DIAGNOSES  Final diagnoses:  Right-sided low back pain without sciatica      Gregor Hams, MD 05/14/15 [phone]

## 2015-07-14 ENCOUNTER — Other Ambulatory Visit: Payer: Self-pay | Admitting: Nurse Practitioner

## 2015-07-14 DIAGNOSIS — K7460 Unspecified cirrhosis of liver: Secondary | ICD-10-CM

## 2015-07-23 ENCOUNTER — Ambulatory Visit
Admission: RE | Admit: 2015-07-23 | Discharge: 2015-07-23 | Disposition: A | Discharge status: Home / Self Care | Payer: Medicaid Other | Source: Ambulatory Visit | Attending: Nurse Practitioner | Admitting: Nurse Practitioner

## 2015-07-23 DIAGNOSIS — K7460 Unspecified cirrhosis of liver: Secondary | ICD-10-CM

## 2015-09-24 ENCOUNTER — Encounter: Payer: Self-pay | Admitting: Intensive Care

## 2015-09-24 ENCOUNTER — Emergency Department
Admission: EM | Admit: 2015-09-24 | Discharge: 2015-09-24 | Disposition: A | Discharge status: Home / Self Care | Payer: Medicaid Other | Attending: Emergency Medicine | Admitting: Emergency Medicine

## 2015-09-24 ENCOUNTER — Emergency Department: Payer: Medicaid Other

## 2015-09-24 DIAGNOSIS — Z7951 Long term (current) use of inhaled steroids: Secondary | ICD-10-CM | POA: Diagnosis not present

## 2015-09-24 DIAGNOSIS — R079 Chest pain, unspecified: Secondary | ICD-10-CM | POA: Insufficient documentation

## 2015-09-24 DIAGNOSIS — J441 Chronic obstructive pulmonary disease with (acute) exacerbation: Secondary | ICD-10-CM | POA: Diagnosis not present

## 2015-09-24 DIAGNOSIS — F17210 Nicotine dependence, cigarettes, uncomplicated: Secondary | ICD-10-CM | POA: Insufficient documentation

## 2015-09-24 DIAGNOSIS — Z79899 Other long term (current) drug therapy: Secondary | ICD-10-CM | POA: Insufficient documentation

## 2015-09-24 DIAGNOSIS — Z791 Long term (current) use of non-steroidal anti-inflammatories (NSAID): Secondary | ICD-10-CM | POA: Insufficient documentation

## 2015-09-24 DIAGNOSIS — T405X5S Adverse effect of cocaine, sequela: Secondary | ICD-10-CM | POA: Diagnosis not present

## 2015-09-24 DIAGNOSIS — F1210 Cannabis abuse, uncomplicated: Secondary | ICD-10-CM | POA: Diagnosis not present

## 2015-09-24 LAB — URINE DRUG SCREEN, QUALITATIVE (ARMC ONLY)
Amphetamines, Ur Screen: NOT DETECTED
BARBITURATES, UR SCREEN: NOT DETECTED
Benzodiazepine, Ur Scrn: NOT DETECTED
CANNABINOID 50 NG, UR ~~LOC~~: POSITIVE — AB
Cocaine Metabolite,Ur ~~LOC~~: POSITIVE — AB
MDMA (ECSTASY) UR SCREEN: NOT DETECTED
Methadone Scn, Ur: NOT DETECTED
Opiate, Ur Screen: NOT DETECTED
Phencyclidine (PCP) Ur S: NOT DETECTED
TRICYCLIC, UR SCREEN: NOT DETECTED

## 2015-09-24 LAB — CBC WITH DIFFERENTIAL/PLATELET
Basophils Absolute: 0 10*3/uL (ref 0–0.1)
Basophils Relative: 1 %
EOS ABS: 0.1 10*3/uL (ref 0–0.7)
Eosinophils Relative: 1 %
HEMATOCRIT: 45.5 % (ref 40.0–52.0)
HEMOGLOBIN: 15.9 g/dL (ref 13.0–18.0)
LYMPHS ABS: 2.1 10*3/uL (ref 1.0–3.6)
LYMPHS PCT: 23 %
MCH: 32.5 pg (ref 26.0–34.0)
MCHC: 34.8 g/dL (ref 32.0–36.0)
MCV: 93.2 fL (ref 80.0–100.0)
Monocytes Absolute: 0.7 10*3/uL (ref 0.2–1.0)
Monocytes Relative: 7 %
NEUTROS ABS: 6.3 10*3/uL (ref 1.4–6.5)
NEUTROS PCT: 68 %
Platelets: 191 10*3/uL (ref 150–440)
RBC: 4.88 MIL/uL (ref 4.40–5.90)
RDW: 14.1 % (ref 11.5–14.5)
WBC: 9.2 10*3/uL (ref 3.8–10.6)

## 2015-09-24 LAB — URINALYSIS COMPLETE WITH MICROSCOPIC (ARMC ONLY)
BACTERIA UA: NONE SEEN
Bilirubin Urine: NEGATIVE
Glucose, UA: NEGATIVE mg/dL
Ketones, ur: NEGATIVE mg/dL
Leukocytes, UA: NEGATIVE
Nitrite: NEGATIVE
PROTEIN: NEGATIVE mg/dL
SPECIFIC GRAVITY, URINE: 1.006 (ref 1.005–1.030)
Squamous Epithelial / LPF: NONE SEEN
pH: 7 (ref 5.0–8.0)

## 2015-09-24 LAB — COMPREHENSIVE METABOLIC PANEL
ALBUMIN: 4.3 g/dL (ref 3.5–5.0)
ALT: 15 U/L — AB (ref 17–63)
AST: 21 U/L (ref 15–41)
Alkaline Phosphatase: 70 U/L (ref 38–126)
Anion gap: 7 (ref 5–15)
BUN: 9 mg/dL (ref 6–20)
CHLORIDE: 106 mmol/L (ref 101–111)
CO2: 25 mmol/L (ref 22–32)
CREATININE: 0.87 mg/dL (ref 0.61–1.24)
Calcium: 8.9 mg/dL (ref 8.9–10.3)
GFR calc Af Amer: >60 mL/min (ref >60)
GFR calc non Af Amer: >60 mL/min (ref >60)
GLUCOSE: 110 mg/dL — AB (ref 65–99)
POTASSIUM: 4 mmol/L (ref 3.5–5.1)
Sodium: 138 mmol/L (ref 135–145)
Total Bilirubin: 1 mg/dL (ref 0.3–1.2)
Total Protein: 7.3 g/dL (ref 6.5–8.1)

## 2015-09-24 LAB — LIPASE, BLOOD: Lipase: 22 U/L (ref 11–51)

## 2015-09-24 LAB — TROPONIN I: Troponin I: <0.03 ng/mL (ref <0.031)

## 2015-09-24 LAB — ETHANOL: Alcohol, Ethyl (B): <5 mg/dL (ref <5)

## 2015-09-24 MED ORDER — SODIUM CHLORIDE 0.9 % IV BOLUS (SEPSIS)
1000.0000 mL | Freq: Once | INTRAVENOUS | Status: AC
  Administered 2015-09-24: 1000 mL via INTRAVENOUS

## 2015-09-24 MED ORDER — NITROGLYCERIN 0.4 MG SL SUBL
0.4000 mg | SUBLINGUAL_TABLET | SUBLINGUAL | Status: DC | PRN
  Administered 2015-09-24: 0.4 mg via SUBLINGUAL
  Filled 2015-09-24: qty 1

## 2015-09-24 MED ORDER — ASPIRIN 81 MG PO CHEW
324.0000 mg | CHEWABLE_TABLET | Freq: Once | ORAL | Status: AC
  Administered 2015-09-24: 324 mg via ORAL
  Filled 2015-09-24: qty 4

## 2015-09-24 MED ORDER — LORAZEPAM 2 MG/ML IJ SOLN
1.0000 mg | Freq: Once | INTRAMUSCULAR | Status: AC
  Administered 2015-09-24: 1 mg via INTRAVENOUS
  Filled 2015-09-24: qty 1

## 2015-09-24 MED ORDER — LORAZEPAM 1 MG PO TABS
1.0000 mg | ORAL_TABLET | Freq: Once | ORAL | Status: AC
Start: 2015-09-24 — End: 2015-09-24
  Administered 2015-09-24: 1 mg via ORAL
  Filled 2015-09-24: qty 1

## 2015-09-24 NOTE — Discharge Instructions (Signed)
Nonspecific Chest Pain  °Chest pain can be caused by many different conditions. There is always a chance that your pain could be related to something serious, such as a heart attack or a blood clot in your lungs. Chest pain can also be caused by conditions that are not life-threatening. If you have chest pain, it is very important to follow up with your health care provider. °CAUSES  °Chest pain can be caused by: °· Heartburn. °· Pneumonia or bronchitis. °· Anxiety or stress. °· Inflammation around your heart (pericarditis) or lung (pleuritis or pleurisy). °· A blood clot in your lung. °· A collapsed lung (pneumothorax). It can develop suddenly on its own (spontaneous pneumothorax) or from trauma to the chest. °· Shingles infection (varicella-zoster virus). °· Heart attack. °· Damage to the bones, muscles, and cartilage that make up your chest wall. This can include: °¨ Bruised bones due to injury. °¨ Strained muscles or cartilage due to frequent or repeated coughing or overwork. °¨ Fracture to one or more ribs. °¨ Sore cartilage due to inflammation (costochondritis). °RISK FACTORS  °Risk factors for chest pain may include: °· Activities that increase your risk for trauma or injury to your chest. °· Respiratory infections or conditions that cause frequent coughing. °· Medical conditions or overeating that can cause heartburn. °· Heart disease or family history of heart disease. °· Conditions or health behaviors that increase your risk of developing a blood clot. °· Having had chicken pox (varicella zoster). °SIGNS AND SYMPTOMS °Chest pain can feel like: °· Burning or tingling on the surface of your chest or deep in your chest. °· Crushing, pressure, aching, or squeezing pain. °· Dull or sharp pain that is worse when you move, cough, or take a deep breath. °· Pain that is also felt in your back, neck, shoulder, or arm, or pain that spreads to any of these areas. °Your chest pain may come and go, or it may stay  constant. °DIAGNOSIS °Lab tests or other studies may be needed to find the cause of your pain. Your health care provider may have you take a test called an ambulatory ECG (electrocardiogram). An ECG records your heartbeat patterns at the time the test is performed. You may also have other tests, such as: °· Transthoracic echocardiogram (TTE). During echocardiography, sound waves are used to create a picture of all of the heart structures and to look at how blood flows through your heart. °· Transesophageal echocardiogram (TEE). This is a more advanced imaging test that obtains images from inside your body. It allows your health care provider to see your heart in finer detail. °· Cardiac monitoring. This allows your health care provider to monitor your heart rate and rhythm in real time. °· Holter monitor. This is a portable device that records your heartbeat and can help to diagnose abnormal heartbeats. It allows your health care provider to track your heart activity for several days, if needed. °· Stress tests. These can be done through exercise or by taking medicine that makes your heart beat more quickly. °· Blood tests. °· Imaging tests. °TREATMENT  °Your treatment depends on what is causing your chest pain. Treatment may include: °· Medicines. These may include: °¨ Acid blockers for heartburn. °¨ Anti-inflammatory medicine. °¨ Pain medicine for inflammatory conditions. °¨ Antibiotic medicine, if an infection is present. °¨ Medicines to dissolve blood clots. °¨ Medicines to treat coronary artery disease. °· Supportive care for conditions that do not require medicines. This may include: °¨ Resting. °¨ Applying heat   or cold packs to injured areas. °¨ Limiting activities until pain decreases. °HOME CARE INSTRUCTIONS °· If you were prescribed an antibiotic medicine, finish it all even if you start to feel better. °· Avoid any activities that bring on chest pain. °· Do not use any tobacco products, including  cigarettes, chewing tobacco, or electronic cigarettes. If you need help quitting, ask your health care provider. °· Do not drink alcohol. °· Take medicines only as directed by your health care provider. °· Keep all follow-up visits as directed by your health care provider. This is important. This includes any further testing if your chest pain does not go away. °· If heartburn is the cause for your chest pain, you may be told to keep your head raised (elevated) while sleeping. This reduces the chance that acid will go from your stomach into your esophagus. °· Make lifestyle changes as directed by your health care provider. These may include: °¨ Getting regular exercise. Ask your health care provider to suggest some activities that are safe for you. °¨ Eating a heart-healthy diet. A registered dietitian can help you to learn healthy eating options. °¨ Maintaining a healthy weight. °¨ Managing diabetes, if necessary. °¨ Reducing stress. °SEEK MEDICAL CARE IF: °· Your chest pain does not go away after treatment. °· You have a rash with blisters on your chest. °· You have a fever. °SEEK IMMEDIATE MEDICAL CARE IF:  °· Your chest pain is worse. °· You have an increasing cough, or you cough up blood. °· You have severe abdominal pain. °· You have severe weakness. °· You faint. °· You have chills. °· You have sudden, unexplained chest discomfort. °· You have sudden, unexplained discomfort in your arms, back, neck, or jaw. °· You have shortness of breath at any time. °· You suddenly start to sweat, or your skin gets clammy. °· You feel nauseous or you vomit. °· You suddenly feel light-headed or dizzy. °· Your heart begins to beat quickly, or it feels like it is skipping beats. °These symptoms may represent a serious problem that is an emergency. Do not wait to see if the symptoms will go away. Get medical help right away. Call your local emergency services (911 in the [place] Do not drive yourself to the hospital. °  °This  information is not intended to replace advice given to you by your health care provider. Make sure you discuss any questions you have with your health care provider. °  °Document Released: 04/20/2005 Document Revised: 08/01/2014 Document Reviewed: 02/14/2014 °Elsevier Interactive Patient Education ©2016 [employer] ° °

## 2015-09-24 NOTE — ED Notes (Signed)
Patient denies chest pain at this time 

## 2015-09-24 NOTE — ED Notes (Signed)
Patient arrived by EMS. EMS reports patient was picked up from a crack house. PAtient c/o pain in central chest and was awoke from his sleep with this sharp chest pain. PAtient reports "I had crack cocaine last night" Patient is alert and oriented X4. Denies pain anywhere but chest

## 2015-09-24 NOTE — ED Notes (Signed)
Helped pt with using the restroom.

## 2015-09-24 NOTE — ED Provider Notes (Signed)
[facility] Emergency Department Provider Note  ____________________________________________  Time seen: 7:15 AM on arrival by EMS  I have reviewed the triage vital signs and the nursing notes.   HISTORY  Chief Complaint Chest Pain    HPI Keith Gibbs is a 55 y.o. male who complains of central chest pain that started 25 minutes ago woke him out of sleep. Sudden onset, nonradiating, severe, pressure, constant since onset. Not exertional or pleuritic. Feels little bit short of breath but no vomiting or diaphoresis. States that he did use a large amount of crack cocaine last night, more than usual. he also drank a few beers and smokes cigarettes.      Past Medical History  Diagnosis Date  . Anxiety   . Depression   . Arthritis   . Bursitis   . Hepatitis C 2012    No longer has Hep C     Patient Active Problem List   Diagnosis Date Noted  . Abnormal ejaculation 11/30/2012  . Chest pain, unspecified 11/30/2012  . Hip pain, left 08/29/2012  . Swollen testicle 07/27/2012  . Schizophrenia ([place]) 06/27/2012  . Osteoarthritis 06/27/2012  . Shoulder pain 06/27/2012  . COPD (chronic obstructive pulmonary disease) ([place]) 06/27/2012  . Chronic hepatitis C ([place]) 06/27/2012  . GERD (gastroesophageal reflux disease) 06/27/2012     Past Surgical History  Procedure Laterality Date  . Appendectomy    . Shoulder surgery  04/09/2012  . Back surgery    . Abdominal surgery      Intestines     Current Outpatient Rx  Name  Route  Sig  Dispense  Refill  . acetaminophen (TYLENOL) 500 MG tablet   Oral   Take 1,000 mg by mouth every 6 (six) hours as needed. For pain.         Marland Kitchen albuterol (PROAIR HFA) 108 (90 BASE) MCG/ACT inhaler   Inhalation   Inhale 2 puffs into the lungs every 6 (six) hours as needed for wheezing or shortness of breath.          Marland Kitchen ibuprofen (ADVIL,MOTRIN) 800 MG tablet   Oral   Take 800 mg by mouth 2 (two) times daily.          Marland Kitchen omeprazole (PRILOSEC) 20 MG capsule   Oral   Take 20 mg by mouth daily.         Marland Kitchen rOPINIRole (REQUIP) 0.5 MG tablet   Oral   Take 1-2 tablets by mouth 2 (two) times daily as needed.      1   . ARIPiprazole (ABILIFY) 5 MG tablet   Oral   Take 5 mg by mouth at bedtime.         Marland Kitchen azithromycin (ZITHROMAX Z-PAK) 250 MG tablet      2 po day one, then 1 daily x 4 days Patient not taking: Reported on 09/24/2015   5 tablet   0   . busPIRone (BUSPAR) 10 MG tablet   Oral   Take 10 mg by mouth 2 (two) times daily.         Marland Kitchen doxepin (SINEQUAN) 25 MG capsule   Oral   Take 25 mg by mouth at bedtime.         . Fluticasone-Salmeterol (ADVAIR DISKUS) 250-50 MCG/DOSE AEPB   Inhalation   Inhale 1 puff into the lungs 2 (two) times daily.         Marland Kitchen HYDROcodone-acetaminophen (HYCET) 7.5-325 mg/15 ml solution   Oral   Take  15 mLs by mouth every 6 (six) hours as needed. Patient not taking: Reported on 04/06/2015   120 mL   0   . hydrOXYzine (ATARAX/VISTARIL) 50 MG tablet   Oral   Take 50 mg by mouth 2 (two) times daily as needed for itching.         . lamoTRIgine (LAMICTAL) 100 MG tablet   Oral   Take 200 mg by mouth at bedtime.          . OxyCODONE HCl, Abuse Deter, (OXAYDO) 5 MG TABA   Oral   Take 1 tablet by mouth every 4 (four) hours as needed. Patient not taking: Reported on 09/24/2015   20 tablet   0   . oxyCODONE-acetaminophen (PERCOCET/ROXICET) 5-325 MG per tablet   Oral   Take 1-2 tablets by mouth every 4 (four) hours as needed for severe pain. Patient not taking: Reported on 09/24/2015   20 tablet   0   . predniSONE (DELTASONE) 20 MG tablet   Oral   Take 2 tablets (40 mg total) by mouth daily. Patient not taking: Reported on 09/24/2015   10 tablet   0   . risperiDONE (RISPERDAL) 0.5 MG tablet   Oral   Take 0.5 mg by mouth at bedtime.            Allergies Latuda; Naproxen; Saphris; and Tramadol   Family History  Problem Relation Age of Onset   . Osteoarthritis Mother   . Heart disease Mother   . Hypertension Mother   . Heart failure Father   . Heart disease Father   . Early death Father   . Hypertension Father   . Hypertension Sister     Social History Social History  Substance Use Topics  . Smoking status: Current Every Day Smoker -- 0.50 packs/day    Types: Cigarettes    Last Attempt to Quit: 03/04/2012  . Smokeless tobacco: None  . Alcohol Use: No    Review of Systems  Constitutional:   No fever or chills. No weight changes Eyes:   No blurry vision or double vision.  ENT:   No sore throat.  Cardiovascular:positives as above chest pain. Respiratory:positive shortness of breath without cough gastrointestinal:   Negative for abdominal pain, vomiting and diarrhea.  No BRBPR or melena. Genitourinary:   Negative for dysuria or difficulty urinating. Musculoskeletal:   Negative for back pain. No joint swelling or pain. Skin:   Negative for rash. Neurological:   Negative for headaches, focal weakness or numbness. Psychiatric:  No anxiety or depression.   Endocrine:  No changes in energy or sleep difficulty.  10-point ROS otherwise negative.  ____________________________________________   PHYSICAL EXAM:  VITAL SIGNS: ED Triage Vitals  Enc Vitals Group     BP --      Pulse --      Resp --      Temp --      Temp src --      SpO2 --      Weight --      Height --      Head Cir --      Peak Flow --      Pain Score --      Pain Loc --      Pain Edu? --      Excl. in [place]? --   122/82, respirations 15, heart rate 55, 97.5, 96% on room air  Vital signs reviewed, nursing assessments reviewed.   Constitutional:  Alert and oruncomfortable due to pain Eyes   No scleral icterus. No conjunctival pallor. PERRL. EOMI ENT   Head:   Normocephalic and atraumatic.   Nose:   No congestion/rhinnorhea. No septal hematoma   Mouth/Throat:   MMM, no pharyngeal erythema. No peritonsillar mass.    Neck:    No stridor. No SubQ emphysema. No meningismus. Hematological/Lymphatic/Immunilogical:   No cervical lymphadenopathy. Cardiovascular:   RRR. Symmetric bilateral radial and DP pulses.  No murmurs.  Respiratory:   Normal respiratory effort without tachypnea nor retractions. Breath sounds are clear and equal bilaterally. No wheezes/rales/rhonchi. Gastrointestinal:   Soft and nontender. Non distended. There is no CVA tenderness.  No rebound, rigidity, or guarding. Genitourinary:   deferred Musculoskeletal:   Nontender with normal range of motion in all extremities. No joint effusions.  No lower extremity tenderness.  No edema.Chest wall nontender Neurologic:   Normal speech and language.  CN 2-10 normal. Motor grossly intact. No gross focal neurologic deficits are appreciated.  Skin:    Skin is warm, dry and intact. No rash noted.  No petechiae, purpura, or bullae. Psychiatric:   Mood and affect are normal. ____________________________________________    LABS (pertinent positives/negatives) (all labs ordered are listed, but only abnormal results are displayed) Labs Reviewed  COMPREHENSIVE METABOLIC PANEL - Abnormal; Notable for the following:    Glucose, Bld 110 (*)    ALT 15 (*)    All other components within normal limits  URINALYSIS COMPLETEWITH MICROSCOPIC ([facility] ONLY) - Abnormal; Notable for the following:    Color, Urine YELLOW (*)    APPearance CLEAR (*)    Hgb urine dipstick 2+ (*)    All other components within normal limits  URINE DRUG SCREEN, QUALITATIVE ([facility] ONLY) - Abnormal; Notable for the following:    Cocaine Metabolite,Ur [place] POSITIVE (*)    Cannabinoid 50 Ng, Ur  POSITIVE (*)    All other components within normal limits  ETHANOL  LIPASE, BLOOD  TROPONIN I  CBC WITH DIFFERENTIAL/PLATELET  TROPONIN I  TROPONIN I   _________________________________________________________   EKG: Prehospital EKG by EMS interpreted by me, performed at 6:50 AM  Date: 09/24/2015   Rate: 56  Rhythm: normal sinus rhythm  QRS Axis: normal  Intervals: normal  ST/T Wave abnormalities: normal  Conduction Disutrbances: none  Narrative Interpretation: unremarkable  Repeat EKG performed at 7:20 AM, interpreted by me  Date: 09/24/2015  Rate: 59  Rhythm: normal sinus rhythm  QRS Axis: normal  Intervals: normal  ST/T Wave abnormalities: normal  Conduction Disutrbances: none  Narrative Interpretation: unremarkable         RADIOLOGY  Chest x-ray interpreted by me No acute pathology. No bony abnormalities, normal mediastinal contours and cardiac silhouette. Clear lungs without any focal airspace effusion or consolidation. No pneumothorax.  ____________________________________________   PROCEDURES   ____________________________________________   INITIAL IMPRESSION / ASSESSMENT AND PLAN / ED COURSE  Pertinent labs & imaging results that were available during my care of the patient were reviewed by me and considered in my medical decision making (see chart for details).  Patient presents with cocaine associated chest pain. We'll check serial troponins, give aspirin and Ativan. IV fluids. Continue to monitor on telemetry here in the emergency department.   ----------------------------------------- 1:19 PM on 09/24/2015 -----------------------------------------  Repeat troponins negative 3, ordering a total window of 6 hours out from symptom onset. Chest pain has resolved. Remains hemodynamically stable in the ED. Tolerating oral intake, sober and lucid. No SI HI or hallucinations.  We'll have him follow-up with his primary care doctor.   ____________________________________________   FINAL CLINICAL IMPRESSION(S) / ED DIAGNOSES  Final diagnoses:  Acute chest pain  Adverse effect of cocaine, sequela      Carrie Mew, MD 09/24/15 1321

## 2015-09-24 NOTE — ED Notes (Signed)
States chest pain is almost gone at presents   Rates about 2  Requesting something to eat  Dr Joni Fears aware  And tray given

## 2015-12-21 ENCOUNTER — Emergency Department
Admission: EM | Admit: 2015-12-21 | Discharge: 2015-12-21 | Disposition: A | Discharge status: Home / Self Care | Payer: Medicaid Other | Attending: Emergency Medicine | Admitting: Emergency Medicine

## 2015-12-21 DIAGNOSIS — Z7952 Long term (current) use of systemic steroids: Secondary | ICD-10-CM | POA: Diagnosis not present

## 2015-12-21 DIAGNOSIS — R05 Cough: Secondary | ICD-10-CM | POA: Diagnosis present

## 2015-12-21 DIAGNOSIS — F329 Major depressive disorder, single episode, unspecified: Secondary | ICD-10-CM | POA: Insufficient documentation

## 2015-12-21 DIAGNOSIS — F17210 Nicotine dependence, cigarettes, uncomplicated: Secondary | ICD-10-CM | POA: Insufficient documentation

## 2015-12-21 DIAGNOSIS — J449 Chronic obstructive pulmonary disease, unspecified: Secondary | ICD-10-CM | POA: Diagnosis not present

## 2015-12-21 DIAGNOSIS — Z79899 Other long term (current) drug therapy: Secondary | ICD-10-CM | POA: Diagnosis not present

## 2015-12-21 DIAGNOSIS — F209 Schizophrenia, unspecified: Secondary | ICD-10-CM | POA: Diagnosis not present

## 2015-12-21 DIAGNOSIS — F1490 Cocaine use, unspecified, uncomplicated: Secondary | ICD-10-CM | POA: Insufficient documentation

## 2015-12-21 DIAGNOSIS — M1990 Unspecified osteoarthritis, unspecified site: Secondary | ICD-10-CM | POA: Insufficient documentation

## 2015-12-21 DIAGNOSIS — J0100 Acute maxillary sinusitis, unspecified: Secondary | ICD-10-CM | POA: Insufficient documentation

## 2015-12-21 DIAGNOSIS — J9801 Acute bronchospasm: Secondary | ICD-10-CM | POA: Insufficient documentation

## 2015-12-21 HISTORY — DX: Chronic obstructive pulmonary disease, unspecified: J44.9

## 2015-12-21 MED ORDER — FEXOFENADINE-PSEUDOEPHED ER 60-120 MG PO TB12: 1.0000 | ORAL_TABLET | Freq: Two times a day (BID) | ORAL | Status: DC

## 2015-12-21 MED ORDER — METHYLPREDNISOLONE 4 MG PO TBPK: ORAL_TABLET | ORAL | Status: DC

## 2015-12-21 MED ORDER — BENZONATATE 100 MG PO CAPS
200.0000 mg | ORAL_CAPSULE | Freq: Once | ORAL | Status: AC
Start: 2015-12-21 — End: 2015-12-21
  Administered 2015-12-21: 200 mg via ORAL
  Filled 2015-12-21: qty 2

## 2015-12-21 MED ORDER — IPRATROPIUM-ALBUTEROL 0.5-2.5 (3) MG/3ML IN SOLN
3.0000 mL | Freq: Once | RESPIRATORY_TRACT | Status: AC
  Administered 2015-12-21: 3 mL via RESPIRATORY_TRACT
  Filled 2015-12-21: qty 3

## 2015-12-21 MED ORDER — BENZONATATE 100 MG PO CAPS: 200.0000 mg | ORAL_CAPSULE | Freq: Three times a day (TID) | ORAL | Status: DC | PRN

## 2015-12-21 MED ORDER — AMOXICILLIN 875 MG PO TABS: 875.0000 mg | ORAL_TABLET | Freq: Two times a day (BID) | ORAL | Status: DC

## 2015-12-21 NOTE — Discharge Instructions (Signed)
Bronchospasm, Adult  A bronchospasm is a spasm or tightening of the airways going into the lungs. During a bronchospasm breathing becomes more difficult because the airways get smaller. When this happens there can be coughing, a whistling sound when breathing (wheezing), and difficulty breathing. Bronchospasm is often associated with asthma, but not all patients who experience a bronchospasm have asthma.  CAUSES   A bronchospasm is caused by inflammation or irritation of the airways. The inflammation or irritation may be triggered by:   · Allergies (such as to animals, pollen, food, or mold). Allergens that cause bronchospasm may cause wheezing immediately after exposure or many hours later.    · Infection. Viral infections are believed to be the most common cause of bronchospasm.    · Exercise.    · Irritants (such as pollution, cigarette smoke, strong odors, aerosol sprays, and paint fumes).    · Weather changes. Winds increase molds and pollens in the air. Rain refreshes the air by washing irritants out. Cold air may cause inflammation.    · Stress and emotional upset.    SIGNS AND SYMPTOMS   · Wheezing.    · Excessive nighttime coughing.    · Frequent or severe coughing with a simple cold.    · Chest tightness.    · Shortness of breath.    DIAGNOSIS   Bronchospasm is usually diagnosed through a history and physical exam. Tests, such as chest X-rays, are sometimes done to look for other conditions.  TREATMENT   · Inhaled medicines can be given to open up your airways and help you breathe. The medicines can be given using either an inhaler or a nebulizer machine.  · Corticosteroid medicines may be given for severe bronchospasm, usually when it is associated with asthma.  HOME CARE INSTRUCTIONS   · Always have a plan prepared for seeking medical care. Know when to call your health care provider and local emergency services (911 in the [place] Know where you can access local emergency care.  · Only take medicines as  directed by your health care provider.  · If you were prescribed an inhaler or nebulizer machine, ask your health care provider to explain how to use it correctly. Always use a spacer with your inhaler if you were given one.  · It is necessary to remain calm during an attack. Try to relax and breathe more slowly.   · Control your home environment in the following ways:      Change your heating and air conditioning filter at least once a month.      Limit your use of fireplaces and wood stoves.    Do not smoke and do not allow smoking in your home.      Avoid exposure to perfumes and fragrances.      Get rid of pests (such as roaches and mice) and their droppings.      Throw away plants if you see mold on them.      Keep your house clean and dust free.      Replace carpet with wood, tile, or vinyl flooring. Carpet can trap dander and dust.      Use allergy-proof pillows, mattress covers, and box spring covers.      Wash bed sheets and blankets every week in hot water and dry them in a dryer.      Use blankets that are made of polyester or cotton.      Wash hands frequently.  SEEK MEDICAL CARE IF:   · You have muscle aches.    · You have chest pain.    · The sputum changes from clear or   white to yellow, green, gray, or bloody.    · The sputum you cough up gets thicker.    · There are problems that may be related to the medicine you are given, such as a rash, itching, swelling, or trouble breathing.    SEEK IMMEDIATE MEDICAL CARE IF:   · You have worsening wheezing and coughing even after taking your prescribed medicines.    · You have increased difficulty breathing.    · You develop severe chest pain.  MAKE SURE YOU:   · Understand these instructions.  · Will watch your condition.  · Will get help right away if you are not doing well or get worse.     This information is not intended to replace advice given to you by your health care provider. Make sure you discuss any questions you have with your health care  provider.     Document Released: 07/14/2003 Document Revised: 08/01/2014 Document Reviewed: 12/31/2012  Elsevier Interactive Patient Education ©2016 [employer]

## 2015-12-21 NOTE — ED Provider Notes (Signed)
[facility] Emergency Department Provider Note   ____________________________________________  Time seen: Approximately 9:31 AM  I have reviewed the triage vital signs and the nursing notes.   HISTORY  Chief Complaint Nasal Congestion and Cough    HPI Keith Gibbs is a 55 y.o. male patient complain of cough sore throat for 2-3 days. Patient also states she's having nasal congestion intermittently rhinorrhea for one week. Patient also complaining of facial and ear pain. Patient said he has increased cough with deep inspirations.Patient also complaining of some mild wheezing but he believes secondary to his COPD. Patient denies any fever or chills associated complaint. Patient is nausea but no vomiting patient denies any diarrhea. Information rates his pain discomfort as 8/10. Except for the use of his inhaler multiple palliative measures for his complaint.   Past Medical History  Diagnosis Date  . Anxiety   . Depression   . Arthritis   . Bursitis   . Hepatitis C 2012    No longer has Hep C  . COPD (chronic obstructive pulmonary disease) [facility])     Patient Active Problem List   Diagnosis Date Noted  . Abnormal ejaculation 11/30/2012  . Chest pain, unspecified 11/30/2012  . Hip pain, left 08/29/2012  . Swollen testicle 07/27/2012  . Schizophrenia ([place]) 06/27/2012  . Osteoarthritis 06/27/2012  . Shoulder pain 06/27/2012  . COPD (chronic obstructive pulmonary disease) ([place]) 06/27/2012  . Chronic hepatitis C ([place]) 06/27/2012  . GERD (gastroesophageal reflux disease) 06/27/2012    Past Surgical History  Procedure Laterality Date  . Appendectomy    . Shoulder surgery  04/09/2012  . Back surgery    . Abdominal surgery      Intestines    Current Outpatient Rx  Name  Route  Sig  Dispense  Refill  . acetaminophen (TYLENOL) 500 MG tablet   Oral   Take 1,000 mg by mouth every 6 (six) hours as needed. For pain.         Marland Kitchen albuterol (PROAIR  HFA) 108 (90 BASE) MCG/ACT inhaler   Inhalation   Inhale 2 puffs into the lungs every 6 (six) hours as needed for wheezing or shortness of breath.          Marland Kitchen amoxicillin (AMOXIL) 875 MG tablet   Oral   Take 1 tablet (875 mg total) by mouth 2 (two) times daily.   20 tablet   0   . ARIPiprazole (ABILIFY) 5 MG tablet   Oral   Take 5 mg by mouth at bedtime.         Marland Kitchen azithromycin (ZITHROMAX Z-PAK) 250 MG tablet      2 po day one, then 1 daily x 4 days Patient not taking: Reported on 09/24/2015   5 tablet   0   . benzonatate (TESSALON PERLES) 100 MG capsule   Oral   Take 2 capsules (200 mg total) by mouth 3 (three) times daily as needed for cough.   20 capsule   0   . busPIRone (BUSPAR) 10 MG tablet   Oral   Take 10 mg by mouth 2 (two) times daily.         Marland Kitchen doxepin (SINEQUAN) 25 MG capsule   Oral   Take 25 mg by mouth at bedtime.         . fexofenadine-pseudoephedrine (ALLEGRA-D) 60-120 MG 12 hr tablet   Oral   Take 1 tablet by mouth 2 (two) times daily.   20 tablet  0   . Fluticasone-Salmeterol (ADVAIR DISKUS) 250-50 MCG/DOSE AEPB   Inhalation   Inhale 1 puff into the lungs 2 (two) times daily.         Marland Kitchen HYDROcodone-acetaminophen (HYCET) 7.5-325 mg/15 ml solution   Oral   Take 15 mLs by mouth every 6 (six) hours as needed. Patient not taking: Reported on 04/06/2015   120 mL   0   . hydrOXYzine (ATARAX/VISTARIL) 50 MG tablet   Oral   Take 50 mg by mouth 2 (two) times daily as needed for itching.         Marland Kitchen ibuprofen (ADVIL,MOTRIN) 800 MG tablet   Oral   Take 800 mg by mouth 2 (two) times daily.         Marland Kitchen lamoTRIgine (LAMICTAL) 100 MG tablet   Oral   Take 200 mg by mouth at bedtime.          Marland Kitchen omeprazole (PRILOSEC) 20 MG capsule   Oral   Take 20 mg by mouth daily.         . OxyCODONE HCl, Abuse Deter, (OXAYDO) 5 MG TABA   Oral   Take 1 tablet by mouth every 4 (four) hours as needed. Patient not taking: Reported on 09/24/2015   20  tablet   0   . oxyCODONE-acetaminophen (PERCOCET/ROXICET) 5-325 MG per tablet   Oral   Take 1-2 tablets by mouth every 4 (four) hours as needed for severe pain. Patient not taking: Reported on 09/24/2015   20 tablet   0   . predniSONE (DELTASONE) 20 MG tablet   Oral   Take 2 tablets (40 mg total) by mouth daily. Patient not taking: Reported on 09/24/2015   10 tablet   0   . risperiDONE (RISPERDAL) 0.5 MG tablet   Oral   Take 0.5 mg by mouth at bedtime.         Marland Kitchen rOPINIRole (REQUIP) 0.5 MG tablet   Oral   Take 1-2 tablets by mouth 2 (two) times daily as needed.      1     Allergies Latuda; Naproxen; Saphris; and Tramadol  Family History  Problem Relation Age of Onset  . Osteoarthritis Mother   . Heart disease Mother   . Hypertension Mother   . Heart failure Father   . Heart disease Father   . Early death Father   . Hypertension Father   . Hypertension Sister     Social History Social History  Substance Use Topics  . Smoking status: Current Every Day Smoker -- 0.50 packs/day    Types: Cigarettes    Last Attempt to Quit: 03/04/2012  . Smokeless tobacco: None  . Alcohol Use: No    Review of Systems Constitutional: No fever/chills Eyes: No visual changes. [id number] congestion, ear pressure, sore throat.. Cardiovascular: Denies chest pain. Respiratory:Mild shortness of breath. Cough with deep inspirations Gastrointestinal: No abdominal pain.  No nausea, no vomiting.  No diarrhea.  No constipation. Genitourinary: Negative for dysuria. Musculoskeletal: Negative for back pain. Skin: Negative for rash. Neurological: Negative for headaches, focal weakness or numbness. Psychiatric:Depression and anxiety Hematological/Lymphatic:Hepatitis C Allergic/Immunilogical: The medication list ___________________________________________   PHYSICAL EXAM:  VITAL SIGNS: ED Triage Vitals  Enc Vitals Group     BP 12/21/15 0909 110/62 mmHg     Pulse Rate 12/21/15 0909 60      Resp 12/21/15 0909 18     Temp 12/21/15 0909 98.2 F (36.8 C)     Temp Source 12/21/15 0909 Oral  SpO2 12/21/15 0909 96 %     Weight 12/21/15 0909 220 lb (99.791 kg)     Height 12/21/15 0909 6\' 3"  (1.905 m)     Head Cir --      Peak Flow --      Pain Score 12/21/15 0910 8     Pain Loc --      Pain Edu? --      Excl. in [place]? --     Constitutional: Alert and oriented. Well appearing and in no acute distress. Eyes: Conjunctivae are normal. PERRL. EOMI. Head: Atraumatic. Nose: No congestion/rhinnorhea. Mouth/Throat: Mucous membranes are moist.  Oropharynx non-erythematous. Neck: No stridor. No cervical spine tenderness to palpation. Hematological/Lymphatic/Immunilogical: No cervical lymphadenopathy. Cardiovascular: Normal rate, regular rhythm. Grossly normal heart sounds.  Good peripheral circulation. Respiratory: Normal respiratory effort.  No retractions. Lungs CTAB. Gastrointestinal: Soft and nontender. No distention. No abdominal bruits. No CVA tenderness. Musculoskeletal: No lower extremity tenderness nor edema.  No joint effusions. Neurologic:  Normal speech and language. No gross focal neurologic deficits are appreciated. No gait instability. Skin:  Skin is warm, dry and intact. No rash noted. Psychiatric: Mood and affect are normal. Speech and behavior are normal.  ____________________________________________   LABS (all labs ordered are listed, but only abnormal results are displayed)  Labs Reviewed - No data to display ____________________________________________  EKG   ____________________________________________  RADIOLOGY   ____________________________________________   PROCEDURES  Procedure(s) performed: None  Critical Care performed: No  ____________________________________________   INITIAL IMPRESSION / ASSESSMENT AND PLAN / ED COURSE  Pertinent labs & imaging results that were available during my care of the patient were reviewed by me  and considered in my medical decision making (see chart for details).  Maxillary sinusitis and cough secondary to bronchospasms. Patient given a prescription for amoxicillin, Allegra-D, Tessalon Perles, and Medrol Dosepak. ____________________________________________   FINAL CLINICAL IMPRESSION(S) / ED DIAGNOSES  Final diagnoses:  Acute maxillary sinusitis, recurrence not specified  Cough due to bronchospasm      NEW MEDICATIONS STARTED DURING THIS VISIT:  New Prescriptions   AMOXICILLIN (AMOXIL) 875 MG TABLET    Take 1 tablet (875 mg total) by mouth 2 (two) times daily.   BENZONATATE (TESSALON PERLES) 100 MG CAPSULE    Take 2 capsules (200 mg total) by mouth 3 (three) times daily as needed for cough.   FEXOFENADINE-PSEUDOEPHEDRINE (ALLEGRA-D) 60-120 MG 12 HR TABLET    Take 1 tablet by mouth 2 (two) times daily.     Note:  This document was prepared using Dragon voice recognition software and may include unintentional dictation errors.    Sable Feil, PA-C 12/21/15 [id number]  Daymon Larsen, MD 12/21/15 1010

## 2015-12-21 NOTE — ED Notes (Signed)
See triage note  Cough with sore throat and cough for couple of days   Afebrile on arrival

## 2015-12-21 NOTE — ED Notes (Signed)
Nasal congestion, productive cough and sore throat X 2 days. Reports pressure to facial area. Pt alert and oriented X4, active, cooperative, pt in NAD. RR even and unlabored, color WNL.

## 2015-12-30 ENCOUNTER — Encounter: Payer: Self-pay | Admitting: Nurse Practitioner

## 2016-02-02 ENCOUNTER — Ambulatory Visit: Payer: Self-pay | Admitting: Gastroenterology

## 2016-03-05 ENCOUNTER — Encounter: Payer: Self-pay | Admitting: Emergency Medicine

## 2016-03-05 ENCOUNTER — Emergency Department: Payer: Medicaid Other

## 2016-03-05 ENCOUNTER — Emergency Department
Admission: EM | Admit: 2016-03-05 | Discharge: 2016-03-05 | Disposition: A | Discharge status: Home / Self Care | Payer: Medicaid Other | Attending: Emergency Medicine | Admitting: Emergency Medicine

## 2016-03-05 DIAGNOSIS — F1490 Cocaine use, unspecified, uncomplicated: Secondary | ICD-10-CM | POA: Diagnosis not present

## 2016-03-05 DIAGNOSIS — Z79899 Other long term (current) drug therapy: Secondary | ICD-10-CM | POA: Insufficient documentation

## 2016-03-05 DIAGNOSIS — S3992XA Unspecified injury of lower back, initial encounter: Secondary | ICD-10-CM | POA: Diagnosis present

## 2016-03-05 DIAGNOSIS — S300XXA Contusion of lower back and pelvis, initial encounter: Secondary | ICD-10-CM | POA: Insufficient documentation

## 2016-03-05 DIAGNOSIS — W010XXA Fall on same level from slipping, tripping and stumbling without subsequent striking against object, initial encounter: Secondary | ICD-10-CM | POA: Insufficient documentation

## 2016-03-05 DIAGNOSIS — Y999 Unspecified external cause status: Secondary | ICD-10-CM | POA: Insufficient documentation

## 2016-03-05 DIAGNOSIS — J449 Chronic obstructive pulmonary disease, unspecified: Secondary | ICD-10-CM | POA: Insufficient documentation

## 2016-03-05 DIAGNOSIS — Z791 Long term (current) use of non-steroidal anti-inflammatories (NSAID): Secondary | ICD-10-CM | POA: Diagnosis not present

## 2016-03-05 DIAGNOSIS — F17210 Nicotine dependence, cigarettes, uncomplicated: Secondary | ICD-10-CM | POA: Diagnosis not present

## 2016-03-05 DIAGNOSIS — Z7951 Long term (current) use of inhaled steroids: Secondary | ICD-10-CM | POA: Insufficient documentation

## 2016-03-05 DIAGNOSIS — Y9389 Activity, other specified: Secondary | ICD-10-CM | POA: Insufficient documentation

## 2016-03-05 DIAGNOSIS — Z23 Encounter for immunization: Secondary | ICD-10-CM | POA: Diagnosis not present

## 2016-03-05 DIAGNOSIS — Y9289 Other specified places as the place of occurrence of the external cause: Secondary | ICD-10-CM | POA: Insufficient documentation

## 2016-03-05 DIAGNOSIS — S30810A Abrasion of lower back and pelvis, initial encounter: Secondary | ICD-10-CM

## 2016-03-05 MED ORDER — CYCLOBENZAPRINE HCL 10 MG PO TABS: 10.0000 mg | ORAL_TABLET | Freq: Three times a day (TID) | ORAL | 0 refills | Status: DC | PRN

## 2016-03-05 MED ORDER — IBUPROFEN 600 MG PO TABS: 600.0000 mg | ORAL_TABLET | Freq: Four times a day (QID) | ORAL | 0 refills | Status: DC | PRN

## 2016-03-05 MED ORDER — TETANUS-DIPHTH-ACELL PERTUSSIS 5-2.5-18.5 LF-MCG/0.5 IM SUSP
INTRAMUSCULAR | Status: AC
  Administered 2016-03-05: 0.5 mL via INTRAMUSCULAR
  Filled 2016-03-05: qty 0.5

## 2016-03-05 MED ORDER — TETANUS-DIPHTH-ACELL PERTUSSIS 5-2.5-18.5 LF-MCG/0.5 IM SUSP
0.5000 mL | Freq: Once | INTRAMUSCULAR | Status: AC
  Administered 2016-03-05: 0.5 mL via INTRAMUSCULAR

## 2016-03-05 MED ORDER — KETOROLAC TROMETHAMINE 30 MG/ML IJ SOLN
INTRAMUSCULAR | Status: AC
  Filled 2016-03-05: qty 1

## 2016-03-05 MED ORDER — KETOROLAC TROMETHAMINE 30 MG/ML IJ SOLN
30.0000 mg | Freq: Once | INTRAMUSCULAR | Status: AC
  Administered 2016-03-05: 30 mg via INTRAMUSCULAR

## 2016-03-05 NOTE — ED Triage Notes (Signed)
Low back pin since fall yesterday evening, does not radiate to either leg. Scrapes down lower back noted.

## 2016-03-05 NOTE — ED Provider Notes (Signed)
[facility] Emergency Department Provider Note  ____________________________________________  Time seen: Approximately 9:34 AM  I have reviewed the triage vital signs and the nursing notes.   HISTORY  Chief Complaint Back Pain    HPI Keith Gibbs is a 55 y.o. male , NAD, presents to the emergency department with several hour history of lower back pain. Patient states he was carrying 2 bags of trash outside when he slipped on his wet porch and fell onto his lower back. States he slid down the stairs causing abrasions to his lower back. Does have a history of lumbar surgery approximately 2+ years ago. Has not taken anything for his pain at this time. Denies any numbness, weakness, tingling, saddle paresthesias nor loss of bowel or bladder control. Denies chest pain, shortness breath, visual changes, headaches. Has not had any fevers, chills, body aches. Denies any blood in his urine.   Past Medical History:  Diagnosis Date  . Anxiety   . Arthritis   . Bursitis   . COPD (chronic obstructive pulmonary disease) ([place])   . Depression   . Hepatitis C 2012   No longer has Hep C    Patient Active Problem List   Diagnosis Date Noted  . Abnormal ejaculation 11/30/2012  . Chest pain, unspecified 11/30/2012  . Hip pain, left 08/29/2012  . Swollen testicle 07/27/2012  . Schizophrenia ([place]) 06/27/2012  . Osteoarthritis 06/27/2012  . Shoulder pain 06/27/2012  . COPD (chronic obstructive pulmonary disease) ([place]) 06/27/2012  . Chronic hepatitis C ([place]) 06/27/2012  . GERD (gastroesophageal reflux disease) 06/27/2012    Past Surgical History:  Procedure Laterality Date  . ABDOMINAL SURGERY     Intestines  . APPENDECTOMY    . BACK SURGERY    . SHOULDER SURGERY  04/09/2012    Prior to Admission medications   Medication Sig Start Date End Date Taking? Authorizing Provider  acetaminophen (TYLENOL) 500 MG tablet Take 1,000 mg by mouth every 6 (six) hours as  needed. For pain.    Historical Provider, MD  albuterol (PROAIR HFA) 108 (90 BASE) MCG/ACT inhaler Inhale 2 puffs into the lungs every 6 (six) hours as needed for wheezing or shortness of breath.  05/28/12   Historical Provider, MD  amoxicillin (AMOXIL) 875 MG tablet Take 1 tablet (875 mg total) by mouth 2 (two) times daily. 12/21/15   Sable Feil, PA-C  ARIPiprazole (ABILIFY) 5 MG tablet Take 5 mg by mouth at bedtime.    Historical Provider, MD  benzonatate (TESSALON PERLES) 100 MG capsule Take 2 capsules (200 mg total) by mouth 3 (three) times daily as needed for cough. 12/21/15   Sable Feil, PA-C  busPIRone (BUSPAR) 10 MG tablet Take 10 mg by mouth 2 (two) times daily.    Historical Provider, MD  cyclobenzaprine (FLEXERIL) 10 MG tablet Take 1 tablet (10 mg total) by mouth 3 (three) times daily as needed for muscle spasms. 03/05/16   Jami L Hagler, PA-C  doxepin (SINEQUAN) 25 MG capsule Take 25 mg by mouth at bedtime.    Historical Provider, MD  fexofenadine-pseudoephedrine (ALLEGRA-D) 60-120 MG 12 hr tablet Take 1 tablet by mouth 2 (two) times daily. 12/21/15   Sable Feil, PA-C  Fluticasone-Salmeterol (ADVAIR DISKUS) 250-50 MCG/DOSE AEPB Inhale 1 puff into the lungs 2 (two) times daily. 03/20/12   Historical Provider, MD  hydrOXYzine (ATARAX/VISTARIL) 50 MG tablet Take 50 mg by mouth 2 (two) times daily as needed for itching.    Historical Provider,  MD  ibuprofen (ADVIL,MOTRIN) 600 MG tablet Take 1 tablet (600 mg total) by mouth every 6 (six) hours as needed. 03/05/16   Jami L Hagler, PA-C  lamoTRIgine (LAMICTAL) 100 MG tablet Take 200 mg by mouth at bedtime.     Historical Provider, MD  methylPREDNISolone (MEDROL DOSEPAK) 4 MG TBPK tablet Take Tapered dose as directed 12/21/15   Sable Feil, PA-C  omeprazole (PRILOSEC) 20 MG capsule Take 20 mg by mouth daily.    Historical Provider, MD  risperiDONE (RISPERDAL) 0.5 MG tablet Take 0.5 mg by mouth at bedtime.    Historical Provider, MD   rOPINIRole (REQUIP) 0.5 MG tablet Take 1-2 tablets by mouth 2 (two) times daily as needed. 08/05/15   Historical Provider, MD    Allergies Anette Guarneri [lurasidone hcl]; Naproxen; Saphris [asenapine]; and Tramadol  Family History  Problem Relation Age of Onset  . Osteoarthritis Mother   . Heart disease Mother   . Hypertension Mother   . Heart failure Father   . Heart disease Father   . Early death Father   . Hypertension Father   . Hypertension Sister     Social History Social History  Substance Use Topics  . Smoking status: Current Every Day Smoker    Packs/day: 0.50    Types: Cigarettes    Last attempt to quit: 03/04/2012  . Smokeless tobacco: Not on file  . Alcohol use No     Review of Systems  Constitutional: No fever/chills, fatiuge Eyes: No visual changes.  Cardiovascular: No chest pain. Respiratory: No shortness of breath. No wheezing.  Gastrointestinal: No abdominal pain.  No nausea, vomiting.   Genitourinary: No hematuria.  Musculoskeletal: Positive for lower back pain.  Skin: Positive for abrasions. Negative for rash. Neurological: Negative for headaches, focal weakness or numbness. No LOC, dizziness, saddle paresthesias, loss of bowel or bladder control, tingling. 10-point ROS otherwise negative.  ____________________________________________   PHYSICAL EXAM:  VITAL SIGNS: ED Triage Vitals  Enc Vitals Group     BP 03/05/16 0932 (!) 144/68     Pulse Rate 03/05/16 0932 65     Resp 03/05/16 0932 18     Temp 03/05/16 0932 98.3 F (36.8 C)     Temp Source 03/05/16 0932 Oral     SpO2 03/05/16 0932 99 %     Weight 03/05/16 0934 199 lb (90.3 kg)     Height 03/05/16 0934 6\' 3"  (1.905 m)     Head Circumference --      Peak Flow --      Pain Score 03/05/16 0934 10     Pain Loc --      Pain Edu? --      Excl. in [place]? --      Constitutional: Alert and oriented. Well appearing and in no acute distress. Eyes: Conjunctivae are normal.  Head: Atraumatic. Neck:  Supple with full range of motion. No cervical spine tenderness to palpation. Hematological/Lymphatic/Immunilogical: No cervical lymphadenopathy. Cardiovascular: Normal rate, regular rhythm. Normal S1 and S2.  Good peripheral circulation with 2+ pulses noted in bilateral lower extremities. Respiratory: Normal respiratory effort without tachypnea or retractions. Lungs CTAB with breath sounds noted in all lung fields. Musculoskeletal: Patient's posture is slightly tilted forward due to pain but gait is normal. Tenderness to to palpation diffusely about the midline lumbar spine as well as paraspinal regions. Mild muscle spasm noted to the right lumbar region. Full range of motion of the lumbar spine but pain in all directions. Negative straight leg  raise bilaterally. No SI joint tenderness bilaterally. No lower extremity tenderness nor edema.  No joint effusions. Neurologic:  Normal speech and language. No gross focal neurologic deficits are appreciated. Sensation to light touch grossly intact bilateral lower extremities.  Skin:  Multiple superficial, nonbleeding abrasions noted to the lower back. No erythema or swelling surrounding these areas. No oozing or weeping. All abrasions are scabbed over. Skin is warm, dry and intact. No rash noted. Psychiatric: Mood and affect are normal. Speech and behavior are normal. Patient exhibits appropriate insight and judgement.   ____________________________________________   LABS  None ____________________________________________  EKG  None ____________________________________________  RADIOLOGY I have personally viewed and evaluated these images (plain radiographs) as part of my medical decision making, as well as reviewing the written report by the radiologist.  Dg Lumbar Spine 2-3 Views  Result Date: 03/05/2016 CLINICAL DATA:  Pt states last night he fell down some stairs onto his back. Previous Back surgery- Pt doesn't remember when or where. Pain in  back some radiating pain but mostly in lower back. EXAM: LUMBAR SPINE - 2-3 VIEW COMPARISON:  05/14/2015 FINDINGS: Changes of instrumented PLIF L4-S1, hardware stable without surrounding lucency. Alignment stable. No acute fracture or dislocation. Small anterior endplate spurs [id number]. Patchy aortic calcifications without suggestion of aneurysm. IMPRESSION: 1. Stable postop and degenerative changes.  No acute abnormality. Electronically Signed   By: Lucrezia Europe M.D.   On: 03/05/2016 09:58    ____________________________________________    PROCEDURES  Procedure(s) performed: None   Procedures   Medications  ketorolac (TORADOL) 30 MG/ML injection (not administered)  Tdap (BOOSTRIX) injection 0.5 mL (0.5 mLs Intramuscular Given 03/05/16 0943)  ketorolac (TORADOL) 30 MG/ML injection 30 mg (30 mg Intramuscular Given 03/05/16 1010)   Patient tolerated toradol without side effects.  ____________________________________________   INITIAL IMPRESSION / ASSESSMENT AND PLAN / ED COURSE  Pertinent labs & imaging results that were available during my care of the patient were reviewed by me and considered in my medical decision making (see chart for details).  Clinical Course    Patient's diagnosis is consistent with contusion and abrasions of the lower back due to fall. Patient will be discharged home with prescriptions for cyclobenzaprine and ibuprofen to take as directed as needed for pain. Discussed that the patient should walk regularly and not sit or lay down for too long as this could cause increasing pain. Patient is to follow up with his orthopedic provider or Dr. Sabra Heck if symptoms persist past this treatment course. Patient is given ED precautions to return to the ED for any worsening or new symptoms.    ____________________________________________  FINAL CLINICAL IMPRESSION(S) / ED DIAGNOSES  Final diagnoses:  Contusion of lower back, initial encounter  Abrasion of lower back,  initial encounter      NEW MEDICATIONS STARTED DURING THIS VISIT:  Discharge Medication List as of 03/05/2016 10:15 AM    START taking these medications   Details  cyclobenzaprine (FLEXERIL) 10 MG tablet Take 1 tablet (10 mg total) by mouth 3 (three) times daily as needed for muscle spasms., Starting Sat 03/05/2016, [place], PA-C 03/05/16 1053    Harvest Dark, MD 03/05/16 1445

## 2016-03-15 ENCOUNTER — Other Ambulatory Visit: Payer: Self-pay

## 2016-03-15 ENCOUNTER — Encounter: Payer: Self-pay | Admitting: Gastroenterology

## 2016-03-15 ENCOUNTER — Ambulatory Visit (INDEPENDENT_AMBULATORY_CARE_PROVIDER_SITE_OTHER): Payer: Medicaid Other | Admitting: Gastroenterology

## 2016-03-15 VITALS — BP 116/57 | HR 51 | Temp 98.2°F | Ht 75.0 in | Wt 196.0 lb

## 2016-03-15 DIAGNOSIS — B182 Chronic viral hepatitis C: Secondary | ICD-10-CM | POA: Diagnosis not present

## 2016-03-15 NOTE — Progress Notes (Signed)
Gastroenterology Consultation  Referring Provider:     Ronnell Freshwater, NP Primary Care Physician:  Ronnell Freshwater, NP Primary Gastroenterologist:  Dr. Allen Norris     Reason for Consultation:     Hepatitis C antibody positive        HPI:   Keith Gibbs is a 55 y.o. y/o male referred for consultation & management of Hepatitis C antibody positive by Dr. Ronnell Freshwater, NP.  His patient comes today with a report of having hepatitis C despite being treated.  The patient reports that he was treated in [place] and followed up post treatment and was told he was a sustained viral responder.  The patient then had recent lab work and was told that he has hepatitis C again and he was sent to me.  On reviewing the lab work it appears that the patient had a positive antibody with normal liver enzymes.  The patient also reports that he was told that he had F3 and F4 fibrosis.  He denies being followed for possible HCC.  Past Medical History:  Diagnosis Date  . Anxiety   . Arthritis   . Bursitis   . COPD (chronic obstructive pulmonary disease) ([place])   . Depression   . Hepatitis C 2012   No longer has Hep C    Past Surgical History:  Procedure Laterality Date  . ABDOMINAL SURGERY     Intestines  . APPENDECTOMY    . BACK SURGERY    . SHOULDER SURGERY  04/09/2012    Prior to Admission medications   Medication Sig Start Date End Date Taking? Authorizing Provider  acetaminophen (TYLENOL) 500 MG tablet Take 1,000 mg by mouth every 6 (six) hours as needed. For pain.   Yes Historical Provider, MD  albuterol (PROAIR HFA) 108 (90 BASE) MCG/ACT inhaler Inhale 2 puffs into the lungs every 6 (six) hours as needed for wheezing or shortness of breath.  05/28/12  Yes Historical Provider, MD  fexofenadine-pseudoephedrine (ALLEGRA-D) 60-120 MG 12 hr tablet Take 1 tablet by mouth 2 (two) times daily. 12/21/15  Yes Sable Feil, PA-C  Fluticasone-Salmeterol (ADVAIR DISKUS) 250-50 MCG/DOSE AEPB Inhale 1  puff into the lungs 2 (two) times daily. 03/20/12  Yes Historical Provider, MD  omeprazole (PRILOSEC) 20 MG capsule Take 20 mg by mouth daily.   Yes Historical Provider, MD  risperiDONE (RISPERDAL) 0.5 MG tablet Take 0.5 mg by mouth at bedtime.   Yes Historical Provider, MD  rOPINIRole (REQUIP) 0.5 MG tablet Take 1-2 tablets by mouth 2 (two) times daily as needed. 08/05/15  Yes Historical Provider, MD  amoxicillin (AMOXIL) 875 MG tablet Take 1 tablet (875 mg total) by mouth 2 (two) times daily. Patient not taking: Reported on 03/15/2016 12/21/15   Sable Feil, PA-C  ARIPiprazole (ABILIFY) 5 MG tablet Take 5 mg by mouth at bedtime.    Historical Provider, MD  benzonatate (TESSALON PERLES) 100 MG capsule Take 2 capsules (200 mg total) by mouth 3 (three) times daily as needed for cough. Patient not taking: Reported on 03/15/2016 12/21/15   Sable Feil, PA-C  busPIRone (BUSPAR) 10 MG tablet Take 10 mg by mouth 2 (two) times daily.    Historical Provider, MD  cyclobenzaprine (FLEXERIL) 10 MG tablet Take 1 tablet (10 mg total) by mouth 3 (three) times daily as needed for muscle spasms. Patient not taking: Reported on 03/15/2016 03/05/16   Jami L Hagler, PA-C  doxepin (SINEQUAN) 25 MG capsule Take 25 mg  by mouth at bedtime.    Historical Provider, MD  hydrOXYzine (ATARAX/VISTARIL) 50 MG tablet Take 50 mg by mouth 2 (two) times daily as needed for itching.    Historical Provider, MD  ibuprofen (ADVIL,MOTRIN) 600 MG tablet Take 1 tablet (600 mg total) by mouth every 6 (six) hours as needed. Patient not taking: Reported on 03/15/2016 03/05/16   Jami L Hagler, PA-C  lamoTRIgine (LAMICTAL) 100 MG tablet Take 200 mg by mouth at bedtime.     Historical Provider, MD  methylPREDNISolone (MEDROL DOSEPAK) 4 MG TBPK tablet Take Tapered dose as directed Patient not taking: Reported on 03/15/2016 12/21/15   Sable Feil, PA-C    Family History  Problem Relation Age of Onset  . Osteoarthritis Mother   . Heart disease  Mother   . Hypertension Mother   . Heart failure Father   . Heart disease Father   . Early death Father   . Hypertension Father   . Hypertension Sister      Social History  Substance Use Topics  . Smoking status: Current Every Day Smoker    Packs/day: 0.50    Types: Cigarettes    Last attempt to quit: 03/04/2012  . Smokeless tobacco: Never Used  . Alcohol use No    Allergies as of 03/15/2016 - Review Complete 03/05/2016  Allergen Reaction Noted  . Latuda [lurasidone hcl]  01/14/2014  . Naproxen Hives and Swelling 11/04/2011  . Saphris [asenapine]  01/17/2014    Review of Systems:    All systems reviewed and negative except where noted in HPI.   Physical Exam:  BP (!) 116/57   Pulse (!) 51   Temp 98.2 F (36.8 C) (Oral)   Ht 6\' 3"  (1.905 m)   Wt 196 lb (88.9 kg)   BMI 24.50 kg/m  No LMP for male patient. Psych:  Alert and cooperative. Normal mood and affect. General:   Alert,  Well-developed, well-nourished, pleasant and cooperative in NAD Head:  Normocephalic and atraumatic. Eyes:  Sclera clear, no icterus.   Conjunctiva pink. Ears:  Normal auditory acuity. Nose:  No deformity, discharge, or lesions. Mouth:  No deformity or lesions,oropharynx pink & moist. Neck:  Supple; no masses or thyromegaly. Lungs:  Respirations even and unlabored.  Clear throughout to auscultation.   No wheezes, crackles, or rhonchi. No acute distress. Heart:  Regular rate and rhythm; no murmurs, clicks, rubs, or gallops. Abdomen:  Normal bowel sounds.  No bruits.  Soft, non-tender and non-distended without masses, hepatosplenomegaly or hernias noted.  No guarding or rebound tenderness.  Negative Carnett sign.   Rectal:  Deferred.  Msk:  Symmetrical without gross deformities.  Good, equal movement & strength bilaterally. Pulses:  Normal pulses noted. Extremities:  No clubbing or edema.  No cyanosis. Neurologic:  Alert and oriented x3;  grossly normal neurologically. Skin:  Intact without  significant lesions or rashes.  No jaundice. Lymph Nodes:  No significant cervical adenopathy. Psych:  Alert and cooperative. Normal mood and affect.  Imaging Studies: Dg Lumbar Spine 2-3 Views  Result Date: 03/05/2016 CLINICAL DATA:  Pt states last night he fell down some stairs onto his back. Previous Back surgery- Pt doesn't remember when or where. Pain in back some radiating pain but mostly in lower back. EXAM: LUMBAR SPINE - 2-3 VIEW COMPARISON:  05/14/2015 FINDINGS: Changes of instrumented PLIF L4-S1, hardware stable without surrounding lucency. Alignment stable. No acute fracture or dislocation. Small anterior endplate spurs [id number]. Patchy aortic calcifications without suggestion of aneurysm.  IMPRESSION: 1. Stable postop and degenerative changes.  No acute abnormality. Electronically Signed   By: Lucrezia Europe M.D.   On: 03/05/2016 09:58    Assessment and Plan:   Keith Gibbs is a 55 y.o. y/o male who Was found to have a hepatitis C  antibody test positive with normal liver enzymes.  The patient has a history of hepatitis C and was treated for it.  The patient antibodies will likely be positive indefinitely.  This does not indicate a active infection but a past infection.  The patient will have his blood sent off for viral load to see if he has hepatitis C again.  He will also have a fibrosis scan of his liver to see if his liver shows any signs of cirrhosis which would need [place] surveillance. The patient has been explained the plan and agrees with it.   Note: This dictation was prepared with Dragon dictation along with smaller phrase technology. Any transcriptional errors that result from this process are unintentional.

## 2016-03-15 NOTE — Patient Instructions (Signed)
You are scheduled for a RUQ abdominal US/tissue elastography at [facility] on Wednesday, Sept 6th @ 8:30am. Please arrive at 8:00am and check in at the medical mall. You cannot have eat or drink after midnight Tuesday night.

## 2016-03-29 LAB — HCV RT-PCR, QUANT (NON-GRAPH)

## 2016-03-30 ENCOUNTER — Ambulatory Visit
Admission: RE | Admit: 2016-03-30 | Discharge: 2016-03-30 | Disposition: A | Discharge status: Home / Self Care | Payer: Medicaid Other | Source: Ambulatory Visit | Attending: Gastroenterology | Admitting: Gastroenterology

## 2016-03-30 DIAGNOSIS — B182 Chronic viral hepatitis C: Secondary | ICD-10-CM

## 2016-04-01 ENCOUNTER — Telehealth: Payer: Self-pay

## 2016-04-01 NOTE — Telephone Encounter (Signed)
-----   Message from Lucilla Lame, MD sent at 03/30/2016  8:02 AM EDT ----- Let the patient know that his hepatitis C level was negative which indicates he has not affected currently with hepatitis C. The patient does have a history of a fibrosis score suggestive of cirrhosis. The patient And have a repeat fibrosis scan and blood sent off for alpha-fetoprotein.

## 2016-04-01 NOTE — Telephone Encounter (Signed)
LVM for pt to return my call.

## 2016-04-04 ENCOUNTER — Ambulatory Visit
Admission: RE | Admit: 2016-04-04 | Discharge: 2016-04-04 | Disposition: A | Discharge status: Home / Self Care | Payer: Medicaid Other | Source: Ambulatory Visit | Attending: Gastroenterology | Admitting: Gastroenterology

## 2016-04-04 DIAGNOSIS — B182 Chronic viral hepatitis C: Secondary | ICD-10-CM | POA: Diagnosis not present

## 2016-04-04 DIAGNOSIS — K769 Liver disease, unspecified: Secondary | ICD-10-CM | POA: Insufficient documentation

## 2016-04-04 DIAGNOSIS — K7689 Other specified diseases of liver: Secondary | ICD-10-CM | POA: Insufficient documentation

## 2016-04-04 NOTE — Progress Notes (Signed)
Let the patient know the fbrosis was f2 and F3.

## 2016-04-04 NOTE — Telephone Encounter (Signed)
Patient returned your call. I let him know what you typed and he understood. Patient had a scan done today and wants to know more information on the fibrosis scan and the blood. Please call the patient back to clarify

## 2016-04-08 NOTE — Telephone Encounter (Signed)
Left vm for pt to return my call regarding [place] results.

## 2016-04-08 NOTE — Telephone Encounter (Signed)
-----   Message from Lucilla Lame, MD sent at 04/04/2016 10:16 AM EDT ----- Let the patient know the fbrosis was f2 and F3.

## 2016-04-08 NOTE — Telephone Encounter (Signed)
Pt notified of US results.  

## 2016-05-24 ENCOUNTER — Encounter: Payer: Self-pay | Admitting: Emergency Medicine

## 2016-05-24 ENCOUNTER — Emergency Department
Admission: EM | Admit: 2016-05-24 | Discharge: 2016-05-24 | Disposition: A | Discharge status: Home / Self Care | Payer: Medicaid Other | Attending: Emergency Medicine | Admitting: Emergency Medicine

## 2016-05-24 ENCOUNTER — Emergency Department: Payer: Medicaid Other

## 2016-05-24 DIAGNOSIS — J449 Chronic obstructive pulmonary disease, unspecified: Secondary | ICD-10-CM | POA: Diagnosis not present

## 2016-05-24 DIAGNOSIS — R079 Chest pain, unspecified: Secondary | ICD-10-CM | POA: Diagnosis present

## 2016-05-24 DIAGNOSIS — R001 Bradycardia, unspecified: Secondary | ICD-10-CM | POA: Diagnosis not present

## 2016-05-24 DIAGNOSIS — R0602 Shortness of breath: Secondary | ICD-10-CM | POA: Diagnosis not present

## 2016-05-24 DIAGNOSIS — F17210 Nicotine dependence, cigarettes, uncomplicated: Secondary | ICD-10-CM | POA: Insufficient documentation

## 2016-05-24 DIAGNOSIS — Z79899 Other long term (current) drug therapy: Secondary | ICD-10-CM | POA: Diagnosis not present

## 2016-05-24 LAB — BASIC METABOLIC PANEL
Anion gap: 5 (ref 5–15)
BUN: 9 mg/dL (ref 6–20)
CO2: 27 mmol/L (ref 22–32)
CREATININE: 0.86 mg/dL (ref 0.61–1.24)
Calcium: 9.2 mg/dL (ref 8.9–10.3)
Chloride: 103 mmol/L (ref 101–111)
GFR calc Af Amer: >60 mL/min (ref >60)
GLUCOSE: 100 mg/dL — AB (ref 65–99)
POTASSIUM: 5 mmol/L (ref 3.5–5.1)
Sodium: 135 mmol/L (ref 135–145)

## 2016-05-24 LAB — CBC
HEMATOCRIT: 46.9 % (ref 40.0–52.0)
Hemoglobin: 16.4 g/dL (ref 13.0–18.0)
MCH: 32.7 pg (ref 26.0–34.0)
MCHC: 35.1 g/dL (ref 32.0–36.0)
MCV: 93.3 fL (ref 80.0–100.0)
Platelets: 201 10*3/uL (ref 150–440)
RBC: 5.02 MIL/uL (ref 4.40–5.90)
RDW: 13.3 % (ref 11.5–14.5)
WBC: 9.3 10*3/uL (ref 3.8–10.6)

## 2016-05-24 LAB — TROPONIN I: Troponin I: <0.03 ng/mL (ref <0.03)

## 2016-05-24 MED ORDER — OXYCODONE-ACETAMINOPHEN 5-325 MG PO TABS
2.0000 | ORAL_TABLET | Freq: Once | ORAL | Status: AC
  Administered 2016-05-24: 2 via ORAL
  Filled 2016-05-24: qty 2

## 2016-05-24 MED ORDER — OXYCODONE-ACETAMINOPHEN 5-325 MG PO TABS: 2.0000 | ORAL_TABLET | Freq: Four times a day (QID) | ORAL | 0 refills | Status: DC | PRN

## 2016-05-24 NOTE — ED Notes (Signed)
Patients mother is giving patient a ride home

## 2016-05-24 NOTE — ED Triage Notes (Signed)
Pt presents to ED with reports of left sided chest pain that radiates to left arm for approximately two weeks. Pt reports pain continues to worsen. Pt reports SOB.

## 2016-05-24 NOTE — ED Provider Notes (Signed)
[facility] Emergency Department Provider Note        Time seen: ----------------------------------------- 12:13 PM on 05/24/2016 -----------------------------------------    I have reviewed the triage vital signs and the nursing notes.   HISTORY  Chief Complaint Chest Pain and Shortness of Breath    HPI Keith Gibbs is a 55 y.o. male presents the ER with left-sided chest pain that radiates into his left arm for 2 weeks. Patient states the pain has been intermittent, states he had one episode that brought him to his knees. He reports intermittent shortness of breath, nothing makes his symptoms better or worse. Patient states she does not have any history for coronary artery disease.   Past Medical History:  Diagnosis Date  . Anxiety   . Arthritis   . Bursitis   . COPD (chronic obstructive pulmonary disease) ([place])   . Depression   . Hepatitis C 2012   No longer has Hep C    Patient Active Problem List   Diagnosis Date Noted  . Bipolar 1 disorder ([place]) 05/27/2013  . Myalgia and myositis 05/27/2013  . Abnormal ejaculation 11/30/2012  . Chest pain, unspecified 11/30/2012  . Degenerative arthritis of hip 11/16/2012  . Hip pain, left 08/29/2012  . Swollen testicle 07/27/2012  . Schizophrenia ([place]) 06/27/2012  . Osteoarthritis 06/27/2012  . Shoulder pain 06/27/2012  . COPD (chronic obstructive pulmonary disease) ([place]) 06/27/2012  . Chronic hepatitis C ([place]) 06/27/2012  . GERD (gastroesophageal reflux disease) 06/27/2012  . Complete rupture of rotator cuff 02/10/2012  . Cocaine abuse in remission 12/05/2011    Past Surgical History:  Procedure Laterality Date  . ABDOMINAL SURGERY     Intestines  . APPENDECTOMY    . BACK SURGERY    . SHOULDER SURGERY  04/09/2012    Allergies Latuda [lurasidone hcl]; Naproxen; and Saphris [asenapine]  Social History Social History  Substance Use Topics  . Smoking status: Current Every Day Smoker     Packs/day: 0.50    Types: Cigarettes    Last attempt to quit: 03/04/2012  . Smokeless tobacco: Never Used  . Alcohol use No    Review of Systems Constitutional: Negative for fever. Cardiovascular: Positive for chest pain Respiratory: Negative for shortness of breath. Gastrointestinal: Negative for abdominal pain, vomiting and diarrhea. Genitourinary: Negative for dysuria. Musculoskeletal: Negative for back pain. Skin: Negative for rash. Neurological: Negative for headaches, focal weakness or numbness.  10-point ROS otherwise negative.  ____________________________________________   PHYSICAL EXAM:  VITAL SIGNS: ED Triage Vitals [05/24/16 1001]  Enc Vitals Group     BP 115/61     Pulse Rate (!) 49     Resp 18     Temp 97.5 F (36.4 C)     Temp Source Oral     SpO2 97 %     Weight 200 lb (90.7 kg)     Height 6\' 3"  (1.905 m)     Head Circumference      Peak Flow      Pain Score 4     Pain Loc      Pain Edu?      Excl. in [place]?     Constitutional: Alert and oriented. Well appearing and in no distress. Eyes: Conjunctivae are normal. PERRL. Normal extraocular movements. ENT   Head: Normocephalic and atraumatic.   Nose: No congestion/rhinnorhea.   Mouth/Throat: Mucous membranes are moist.   Neck: No stridor. Cardiovascular: Normal rate, regular rhythm. No murmurs, rubs, or gallops. Respiratory: Normal respiratory  effort without tachypnea nor retractions. Breath sounds are clear and equal bilaterally. No wheezes/rales/rhonchi. Gastrointestinal: Soft and nontender. Normal bowel sounds Musculoskeletal: Nontender with normal range of motion in all extremities. No lower extremity tenderness nor edema. Neurologic:  Normal speech and language. No gross focal neurologic deficits are appreciated.  Skin:  Skin is warm, dry and intact. No rash noted. Psychiatric: Mood and affect are normal. Speech and behavior are normal.   ____________________________________________  EKG: Interpreted by me.Sinus bradycardia with rate of 51 bpm, normal PR interval, normal QRS, normal QT, normal axis.  ____________________________________________  ED COURSE:  Pertinent labs & imaging results that were available during my care of the patient were reviewed by me and considered in my medical decision making (see chart for details). Clinical Course  Patient presents to ER with chest pain of uncertain etiology. We will assess with labs and imaging.  Procedures ____________________________________________   LABS (pertinent positives/negatives)  Labs Reviewed  BASIC METABOLIC PANEL - Abnormal; Notable for the following:       Result Value   Glucose, Bld 100 (*)    All other components within normal limits  CBC  TROPONIN I  TROPONIN I    RADIOLOGY Images were viewed by me  Chest x-ray IMPRESSION: No active cardiopulmonary disease.   ____________________________________________  FINAL ASSESSMENT AND PLAN  Chest pain, Bradycardia  Plan: Patient with labs and imaging as dictated above. Patient presents to the ER in no acute distress. Serial troponins are negative. He is bradycardic but otherwise asymptomatic in a normal sinus rhythm. He'll be referred for close outpatient follow-up with cardiology.   Earleen Newport, MD   Note: This dictation was prepared with Dragon dictation. Any transcriptional errors that result from this process are unintentional    Earleen Newport, MD 05/24/16 1334

## 2016-05-24 NOTE — ED Notes (Signed)
Pt takes tramadol prescribed by PCP for HX of back surgery

## 2016-06-09 ENCOUNTER — Other Ambulatory Visit: Payer: Self-pay | Admitting: Cardiovascular Disease

## 2016-06-10 ENCOUNTER — Encounter
Admission: RE | Disposition: A | Discharge status: Home / Self Care | Payer: Self-pay | Source: Ambulatory Visit | Attending: Internal Medicine

## 2016-06-10 ENCOUNTER — Ambulatory Visit
Admission: RE | Admit: 2016-06-10 | Discharge: 2016-06-11 | Disposition: A | Discharge status: Home / Self Care | Payer: Medicaid Other | Source: Ambulatory Visit | Attending: Internal Medicine | Admitting: Internal Medicine

## 2016-06-10 ENCOUNTER — Encounter: Payer: Self-pay | Admitting: Family Medicine

## 2016-06-10 DIAGNOSIS — Z79899 Other long term (current) drug therapy: Secondary | ICD-10-CM | POA: Diagnosis not present

## 2016-06-10 DIAGNOSIS — F17210 Nicotine dependence, cigarettes, uncomplicated: Secondary | ICD-10-CM | POA: Insufficient documentation

## 2016-06-10 DIAGNOSIS — Z23 Encounter for immunization: Secondary | ICD-10-CM | POA: Diagnosis not present

## 2016-06-10 DIAGNOSIS — M1990 Unspecified osteoarthritis, unspecified site: Secondary | ICD-10-CM | POA: Insufficient documentation

## 2016-06-10 DIAGNOSIS — Z8619 Personal history of other infectious and parasitic diseases: Secondary | ICD-10-CM | POA: Diagnosis not present

## 2016-06-10 DIAGNOSIS — F209 Schizophrenia, unspecified: Secondary | ICD-10-CM | POA: Insufficient documentation

## 2016-06-10 DIAGNOSIS — I200 Unstable angina: Secondary | ICD-10-CM | POA: Diagnosis present

## 2016-06-10 DIAGNOSIS — I25110 Atherosclerotic heart disease of native coronary artery with unstable angina pectoris: Secondary | ICD-10-CM | POA: Insufficient documentation

## 2016-06-10 DIAGNOSIS — Z7982 Long term (current) use of aspirin: Secondary | ICD-10-CM | POA: Insufficient documentation

## 2016-06-10 DIAGNOSIS — Z7901 Long term (current) use of anticoagulants: Secondary | ICD-10-CM | POA: Insufficient documentation

## 2016-06-10 DIAGNOSIS — I2584 Coronary atherosclerosis due to calcified coronary lesion: Secondary | ICD-10-CM | POA: Diagnosis not present

## 2016-06-10 DIAGNOSIS — Z791 Long term (current) use of non-steroidal anti-inflammatories (NSAID): Secondary | ICD-10-CM | POA: Diagnosis not present

## 2016-06-10 DIAGNOSIS — I208 Other forms of angina pectoris: Secondary | ICD-10-CM | POA: Diagnosis present

## 2016-06-10 DIAGNOSIS — J449 Chronic obstructive pulmonary disease, unspecified: Secondary | ICD-10-CM | POA: Insufficient documentation

## 2016-06-10 DIAGNOSIS — F329 Major depressive disorder, single episode, unspecified: Secondary | ICD-10-CM | POA: Diagnosis not present

## 2016-06-10 DIAGNOSIS — F419 Anxiety disorder, unspecified: Secondary | ICD-10-CM | POA: Insufficient documentation

## 2016-06-10 DIAGNOSIS — R079 Chest pain, unspecified: Secondary | ICD-10-CM | POA: Diagnosis present

## 2016-06-10 DIAGNOSIS — Z95820 Peripheral vascular angioplasty status with implants and grafts: Secondary | ICD-10-CM

## 2016-06-10 DIAGNOSIS — I2089 Other forms of angina pectoris: Secondary | ICD-10-CM | POA: Diagnosis present

## 2016-06-10 HISTORY — PX: CARDIAC CATHETERIZATION: SHX172

## 2016-06-10 HISTORY — DX: Peripheral vascular angioplasty status with implants and grafts: Z95.820

## 2016-06-10 LAB — CBC
HEMATOCRIT: 43.3 % (ref 40.0–52.0)
Hemoglobin: 15.4 g/dL (ref 13.0–18.0)
MCH: 33.3 pg (ref 26.0–34.0)
MCHC: 35.6 g/dL (ref 32.0–36.0)
MCV: 93.4 fL (ref 80.0–100.0)
PLATELETS: 174 10*3/uL (ref 150–440)
RBC: 4.63 MIL/uL (ref 4.40–5.90)
RDW: 13.1 % (ref 11.5–14.5)
WBC: 5.9 10*3/uL (ref 3.8–10.6)

## 2016-06-10 LAB — CREATININE, SERUM: CREATININE: 0.87 mg/dL (ref 0.61–1.24)

## 2016-06-10 LAB — MRSA PCR SCREENING: MRSA BY PCR: NEGATIVE

## 2016-06-10 SURGERY — LEFT HEART CATH AND CORONARY ANGIOGRAPHY
Anesthesia: Moderate Sedation

## 2016-06-10 MED ORDER — ONDANSETRON HCL 4 MG/2ML IJ SOLN: 4.0000 mg | Freq: Four times a day (QID) | INTRAMUSCULAR | Status: DC | PRN

## 2016-06-10 MED ORDER — CARVEDILOL 6.25 MG PO TABS: 6.2500 mg | ORAL_TABLET | Freq: Two times a day (BID) | ORAL | Status: DC

## 2016-06-10 MED ORDER — FENTANYL CITRATE (PF) 100 MCG/2ML IJ SOLN
INTRAMUSCULAR | Status: DC | PRN
  Administered 2016-06-10 (×2): 50 ug via INTRAVENOUS

## 2016-06-10 MED ORDER — TICAGRELOR 90 MG PO TABS
ORAL_TABLET | ORAL | Status: DC | PRN
  Administered 2016-06-10: 90 mg via ORAL

## 2016-06-10 MED ORDER — ATORVASTATIN CALCIUM 20 MG PO TABS
40.0000 mg | ORAL_TABLET | Freq: Every day | ORAL | Status: DC
  Administered 2016-06-10: 40 mg via ORAL
  Filled 2016-06-10: qty 2

## 2016-06-10 MED ORDER — NITROGLYCERIN 5 MG/ML IV SOLN
INTRAVENOUS | Status: AC
  Filled 2016-06-10: qty 10

## 2016-06-10 MED ORDER — SODIUM CHLORIDE 0.9 % WEIGHT BASED INFUSION: 1.0000 mL/kg/h | INTRAVENOUS | Status: AC

## 2016-06-10 MED ORDER — TICAGRELOR 90 MG PO TABS
ORAL_TABLET | ORAL | Status: AC
  Filled 2016-06-10: qty 1

## 2016-06-10 MED ORDER — ARIPIPRAZOLE 5 MG PO TABS
5.0000 mg | ORAL_TABLET | Freq: Every day | ORAL | Status: DC
  Administered 2016-06-10: 5 mg via ORAL
  Filled 2016-06-10: qty 1

## 2016-06-10 MED ORDER — SODIUM CHLORIDE 0.9 % WEIGHT BASED INFUSION: 1.0000 mL/kg/h | INTRAVENOUS | Status: DC

## 2016-06-10 MED ORDER — SODIUM CHLORIDE 0.9 % IV SOLN: 250.0000 mL | INTRAVENOUS | Status: DC | PRN

## 2016-06-10 MED ORDER — ASPIRIN EC 81 MG PO TBEC: 81.0000 mg | DELAYED_RELEASE_TABLET | Freq: Every day | ORAL | Status: DC

## 2016-06-10 MED ORDER — BIVALIRUDIN BOLUS VIA INFUSION - CUPID
INTRAVENOUS | Status: DC | PRN
  Administered 2016-06-10: 68.025 mg via INTRAVENOUS

## 2016-06-10 MED ORDER — MIDAZOLAM HCL 2 MG/2ML IJ SOLN
INTRAMUSCULAR | Status: AC
  Filled 2016-06-10: qty 2

## 2016-06-10 MED ORDER — INFLUENZA VAC SPLIT QUAD 0.5 ML IM SUSY
0.5000 mL | PREFILLED_SYRINGE | INTRAMUSCULAR | Status: AC
  Administered 2016-06-11: 0.5 mL via INTRAMUSCULAR
  Filled 2016-06-10: qty 0.5

## 2016-06-10 MED ORDER — ACETAMINOPHEN 325 MG PO TABS: 650.0000 mg | ORAL_TABLET | ORAL | Status: DC | PRN

## 2016-06-10 MED ORDER — ASPIRIN 81 MG PO CHEW
CHEWABLE_TABLET | ORAL | Status: AC
  Filled 2016-06-10: qty 3

## 2016-06-10 MED ORDER — SODIUM CHLORIDE 0.9% FLUSH: 3.0000 mL | Freq: Two times a day (BID) | INTRAVENOUS | Status: DC

## 2016-06-10 MED ORDER — FENTANYL CITRATE (PF) 100 MCG/2ML IJ SOLN
INTRAMUSCULAR | Status: AC
  Filled 2016-06-10: qty 2

## 2016-06-10 MED ORDER — MIDAZOLAM HCL 2 MG/2ML IJ SOLN
INTRAMUSCULAR | Status: DC | PRN
  Administered 2016-06-10 (×2): 1 mg via INTRAVENOUS

## 2016-06-10 MED ORDER — SODIUM CHLORIDE 0.9 % IV SOLN
INTRAVENOUS | Status: DC
  Administered 2016-06-10: 23:00:00 via INTRAVENOUS

## 2016-06-10 MED ORDER — NITROGLYCERIN 1 MG/10 ML FOR IR/CATH LAB
INTRA_ARTERIAL | Status: DC | PRN
  Administered 2016-06-10 (×2): 200 ug via INTRACORONARY

## 2016-06-10 MED ORDER — RISPERIDONE 1 MG PO TABS
0.5000 mg | ORAL_TABLET | Freq: Every day | ORAL | Status: DC
  Administered 2016-06-10: 0.5 mg via ORAL
  Filled 2016-06-10: qty 1

## 2016-06-10 MED ORDER — SODIUM CHLORIDE 0.9 % IV SOLN
INTRAVENOUS | Status: DC | PRN
  Administered 2016-06-10: 200 mL/h via INTRAVENOUS

## 2016-06-10 MED ORDER — ENOXAPARIN SODIUM 40 MG/0.4ML ~~LOC~~ SOLN
40.0000 mg | SUBCUTANEOUS | Status: DC
  Administered 2016-06-10: 40 mg via SUBCUTANEOUS
  Filled 2016-06-10: qty 0.4

## 2016-06-10 MED ORDER — ASPIRIN 81 MG PO CHEW
81.0000 mg | CHEWABLE_TABLET | Freq: Every day | ORAL | Status: DC
  Administered 2016-06-11: 81 mg via ORAL
  Filled 2016-06-10: qty 1

## 2016-06-10 MED ORDER — SODIUM CHLORIDE 0.9 % IV SOLN
250.0000 mL | INTRAVENOUS | Status: DC | PRN
Start: 2016-06-10 — End: 2016-06-11

## 2016-06-10 MED ORDER — SODIUM CHLORIDE 0.9% FLUSH: 3.0000 mL | INTRAVENOUS | Status: DC | PRN

## 2016-06-10 MED ORDER — SODIUM CHLORIDE 0.9 % WEIGHT BASED INFUSION: 3.0000 mL/kg/h | INTRAVENOUS | Status: AC

## 2016-06-10 MED ORDER — ASPIRIN 81 MG PO CHEW
CHEWABLE_TABLET | ORAL | Status: AC
  Administered 2016-06-10: 81 mg
  Filled 2016-06-10: qty 1

## 2016-06-10 MED ORDER — SODIUM CHLORIDE 0.9 % WEIGHT BASED INFUSION
3.0000 mL/kg/h | INTRAVENOUS | Status: DC
  Administered 2016-06-10: 3 mL/kg/h via INTRAVENOUS

## 2016-06-10 MED ORDER — IOPAMIDOL (ISOVUE-300) INJECTION 61%
INTRAVENOUS | Status: DC | PRN
  Administered 2016-06-10: 275 mL via INTRA_ARTERIAL
  Administered 2016-06-10: 120 mL via INTRA_ARTERIAL

## 2016-06-10 MED ORDER — SODIUM CHLORIDE 0.9 % IV SOLN: INTRAVENOUS | Status: DC | PRN

## 2016-06-10 MED ORDER — TICAGRELOR 90 MG PO TABS: 90.0000 mg | ORAL_TABLET | Freq: Two times a day (BID) | ORAL | Status: DC

## 2016-06-10 MED ORDER — ASPIRIN 81 MG PO CHEW: 81.0000 mg | CHEWABLE_TABLET | ORAL | Status: DC

## 2016-06-10 MED ORDER — NITROGLYCERIN 0.4 MG SL SUBL: 0.4000 mg | SUBLINGUAL_TABLET | SUBLINGUAL | Status: DC | PRN

## 2016-06-10 MED ORDER — BIVALIRUDIN 250 MG IV SOLR
INTRAVENOUS | Status: AC
  Filled 2016-06-10: qty 250

## 2016-06-10 MED ORDER — MIDAZOLAM HCL 2 MG/2ML IJ SOLN
INTRAMUSCULAR | Status: DC | PRN
  Administered 2016-06-10: 1 mg via INTRAVENOUS

## 2016-06-10 MED ORDER — TICAGRELOR 90 MG PO TABS
90.0000 mg | ORAL_TABLET | Freq: Two times a day (BID) | ORAL | Status: DC
  Administered 2016-06-10 – 2016-06-11 (×2): 90 mg via ORAL
  Filled 2016-06-10 (×2): qty 1

## 2016-06-10 MED ORDER — BUSPIRONE HCL 10 MG PO TABS
10.0000 mg | ORAL_TABLET | Freq: Two times a day (BID) | ORAL | Status: DC
  Administered 2016-06-10 – 2016-06-11 (×2): 10 mg via ORAL
  Filled 2016-06-10 (×2): qty 1

## 2016-06-10 MED ORDER — ASPIRIN 81 MG PO CHEW
CHEWABLE_TABLET | ORAL | Status: DC | PRN
  Administered 2016-06-10: 243 mg via ORAL

## 2016-06-10 MED ORDER — HEPARIN (PORCINE) IN NACL 2-0.9 UNIT/ML-% IJ SOLN
INTRAMUSCULAR | Status: AC
  Filled 2016-06-10: qty 1000

## 2016-06-10 SURGICAL SUPPLY — 19 items
BALLN TREK RX 2.5X15 (BALLOONS) ×3
BALLOON TREK RX 2.5X15 (BALLOONS) IMPLANT
CATH 5FR JL4 DIAGNOSTIC (CATHETERS) ×2 IMPLANT
CATH 5FR JR4 DIAGNOSTIC (CATHETERS) ×2 IMPLANT
CATH INFINITI 5FR ANG PIGTAIL (CATHETERS) ×2 IMPLANT
CATH INFINITI 5FR JL5 (CATHETERS) ×1 IMPLANT
CATH VISTA GUIDE 6FR XB3.5 SH (CATHETERS) ×1 IMPLANT
CATH VISTA GUIDE 6FR XB4 SH (CATHETERS) ×1 IMPLANT
DEVICE INFLAT 30 PLUS (MISCELLANEOUS) ×1 IMPLANT
GUIDEWIRE 3MM J TIP .035 145 (WIRE) ×1 IMPLANT
KIT MANI 3VAL PERCEP (MISCELLANEOUS) ×3 IMPLANT
KIT TRANSPAC II SGL 4260605 (MISCELLANEOUS) ×1 IMPLANT
NDL PERC 18GX7CM (NEEDLE) IMPLANT
NEEDLE PERC 18GX7CM (NEEDLE) ×3 IMPLANT
PACK CARDIAC CATH (CUSTOM PROCEDURE TRAY) ×3 IMPLANT
SHEATH AVANTI 6FR X 11CM (SHEATH) ×1 IMPLANT
SHEATH PINNACLE 5F 10CM (SHEATH) ×1 IMPLANT
STENT XIENCE ALPINE RX 2.5X18 (Permanent Stent) ×1 IMPLANT
WIRE G HI TQ BMW 190 (WIRE) ×1 IMPLANT

## 2016-06-10 NOTE — Discharge Instructions (Signed)
Groin Insertion Instructions-If you lose feeling or develop tingling or pain in your leg or foot after the procedure, please walk around first.  If the discomfort does not improve , contact your physician and proceed to the nearest emergency room.  Loss of feeling in your leg might mean that a blockage has formed in the artery and this can be appropriately treated.  Limit your activity for the next two days after your procedure.  Avoid stooping, bending, heavy lifting or exertion as this may put pressure on the insertion site.  Resume normal activities in 48 hours.  You may shower after 24 hours but avoid excessive warm water and do not scrub the site.  Remove clear dressing in 48 hours.  If you have had a closure device inserted, do not soak in a tub bath or a hot tub for at least one week. ° °No driving for 48 hours after discharge.  After the procedure, check the insertion site occasionally.  If any oozing occurs or there is apparent swelling, firm pressure over the site will prevent a bruise from forming.  You can not hurt anything by pressing directly on the site.  The pressure stops the bleeding by allowing a small clot to form.  If the bleeding continues after the pressure has been applied for more than 15 minutes, call 911 or go to the nearest emergency room.   ° °The x-ray dye causes you to pass a considerate amount of urine.  For this reason, you will be asked to drink plenty of liquids after the procedure to prevent dehydration.  You may resume you regular diet.  Avoid caffeine products.   ° °For pain at the site of your procedure, take non-aspirin medicines such as Tylenol. ° °Medications: A. Hold Metformin for 48 hours if applicable.  B. Continue taking all your present medications at home unless your doctor prescribes any changes. ° °

## 2016-06-10 NOTE — Progress Notes (Signed)
Pt in recovery post heart cath with stent to lad placement per Dr Clayborn Bigness, pt vitals stable, right groin without bleeding nor hematoma, groin sheath still intact, since post act still elevated thus will be rechecked in 2 hours in order to pull sheath. Pt alert and oriented, sinus brady per monitor as per baseline, denies complaints at this time.

## 2016-06-10 NOTE — H&P (Signed)
[facility] [place] NAME: Keith Gibbs    MR#:  [id number]  DATE OF BIRTH:  January 09, 1961  DATE OF ADMISSION:  06/10/2016  PRIMARY CARE PHYSICIAN: Ronnell Freshwater, NP   REQUESTING/REFERRING PHYSICIAN: Dr Humphrey Rolls  CHIEF COMPLAINT:   Chest pain HISTORY OF PRESENT ILLNESS:  Keith Gibbs  is a 55 y.o. male with a known history of  schizophrenia and tobacco dependence who presented to Dr Laurelyn Sickle office with a few days ago with chest pain for which he underwent stress test which was abnormal and CTA of  coronaries which showed 90% lesion in the mid LAD. Patient is status post cardiac catheterization today with confirmed mid LAD lesion and drug-eluting stent is placed.  PAST MEDICAL HISTORY:   Past Medical History:  Diagnosis Date  . Anxiety   . Arthritis   . Bursitis   . COPD (chronic obstructive pulmonary disease) ([place])   . Depression   . Hepatitis C 2012   No longer has Hep C    PAST SURGICAL HISTORY:   Past Surgical History:  Procedure Laterality Date  . ABDOMINAL SURGERY     Intestines  . APPENDECTOMY    . BACK SURGERY    . SHOULDER SURGERY  04/09/2012    SOCIAL HISTORY:   Social History  Substance Use Topics  . Smoking status: Current Every Day Smoker    Packs/day: 0.50    Types: Cigarettes    Last attempt to quit: 03/04/2012  . Smokeless tobacco: Never Used  . Alcohol use No    FAMILY HISTORY:   Family History  Problem Relation Age of Onset  . Osteoarthritis Mother   . Heart disease Mother   . Hypertension Mother   . Heart failure Father   . Heart disease Father   . Early death Father   . Hypertension Father   . Hypertension Sister     DRUG ALLERGIES:   Allergies  Allergen Reactions  . Latuda [Lurasidone Hcl]     tremor  . Naproxen Hives and Swelling  . Saphris [Asenapine]     Increased tremors    REVIEW OF SYSTEMS:   Review of Systems  Constitutional: Negative.  Negative for chills, fever and  malaise/fatigue.  HENT: Negative.  Negative for ear discharge, ear pain, hearing loss, nosebleeds and sore throat.   Eyes: Negative.  Negative for blurred vision and pain.  Respiratory: Negative.  Negative for cough, hemoptysis, shortness of breath and wheezing.   Cardiovascular: Negative for palpitations and leg swelling.  Gastrointestinal: Negative.  Negative for abdominal pain, blood in stool, diarrhea, nausea and vomiting.  Genitourinary: Negative.  Negative for dysuria.  Musculoskeletal: Negative.  Negative for back pain.  Skin: Negative.   Neurological: Negative for dizziness, tremors, speech change, focal weakness, seizures and headaches.  Endo/Heme/Allergies: Negative.  Does not bruise/bleed easily.  Psychiatric/Behavioral: Negative.  Negative for depression, hallucinations and suicidal ideas.    MEDICATIONS AT HOME:   Prior to Admission medications   Medication Sig Start Date End Date Taking? Authorizing Provider  acetaminophen (TYLENOL) 500 MG tablet Take 1,000 mg by mouth every 6 (six) hours as needed. For pain.   Yes Historical Provider, MD  albuterol (PROAIR HFA) 108 (90 BASE) MCG/ACT inhaler Inhale 2 puffs into the lungs every 6 (six) hours as needed for wheezing or shortness of breath.  05/28/12  Yes Historical Provider, MD  amoxicillin (AMOXIL) 875 MG tablet Take 1 tablet (875 mg total) by mouth 2 (two)  times daily. 12/21/15  Yes Sable Feil, PA-C  ARIPiprazole (ABILIFY) 5 MG tablet Take 5 mg by mouth at bedtime.   Yes Historical Provider, MD  benzonatate (TESSALON PERLES) 100 MG capsule Take 2 capsules (200 mg total) by mouth 3 (three) times daily as needed for cough. 12/21/15  Yes Sable Feil, PA-C  busPIRone (BUSPAR) 10 MG tablet Take 10 mg by mouth 2 (two) times daily.   Yes Historical Provider, MD  cyclobenzaprine (FLEXERIL) 10 MG tablet Take 1 tablet (10 mg total) by mouth 3 (three) times daily as needed for muscle spasms. 03/05/16  Yes Jami L Hagler, PA-C  doxepin  (SINEQUAN) 25 MG capsule Take 25 mg by mouth at bedtime.   Yes Historical Provider, MD  fexofenadine-pseudoephedrine (ALLEGRA-D) 60-120 MG 12 hr tablet Take 1 tablet by mouth 2 (two) times daily. 12/21/15  Yes Sable Feil, PA-C  Fluticasone-Salmeterol (ADVAIR DISKUS) 250-50 MCG/DOSE AEPB Inhale 1 puff into the lungs 2 (two) times daily. 03/20/12  Yes Historical Provider, MD  hydrOXYzine (ATARAX/VISTARIL) 50 MG tablet Take 50 mg by mouth 2 (two) times daily as needed for itching.   Yes Historical Provider, MD  ibuprofen (ADVIL,MOTRIN) 600 MG tablet Take 1 tablet (600 mg total) by mouth every 6 (six) hours as needed. 03/05/16  Yes Jami L Hagler, PA-C  lamoTRIgine (LAMICTAL) 100 MG tablet Take 200 mg by mouth at bedtime.    Yes Historical Provider, MD  omeprazole (PRILOSEC) 20 MG capsule Take 20 mg by mouth daily.   Yes Historical Provider, MD  oxyCODONE-acetaminophen (PERCOCET) 5-325 MG tablet Take 2 tablets by mouth every 6 (six) hours as needed for moderate pain or severe pain. 05/24/16  Yes Earleen Newport, MD  risperiDONE (RISPERDAL) 0.5 MG tablet Take 0.5 mg by mouth at bedtime.   Yes Historical Provider, MD  rOPINIRole (REQUIP) 0.5 MG tablet Take 1-2 tablets by mouth 2 (two) times daily as needed. 08/05/15  Yes Historical Provider, MD  methylPREDNISolone (MEDROL DOSEPAK) 4 MG TBPK tablet Take Tapered dose as directed Patient not taking: Reported on 03/15/2016 12/21/15   Sable Feil, PA-C      VITAL SIGNS:  Blood pressure 124/70, pulse (!) 55, temperature 98.4 F (36.9 C), temperature source Oral, resp. rate 16, height 6\' 3"  (1.905 m), weight 90.7 kg (200 lb), SpO2 99 %.  PHYSICAL EXAMINATION:   Physical Exam  Constitutional: He is oriented to person, place, and time and well-developed, well-nourished, and in no distress. No distress.  HENT:  Head: Normocephalic.  Eyes: No scleral icterus.  Neck: Normal range of motion. Neck supple. No JVD present. No tracheal deviation present.   Cardiovascular: Normal rate, regular rhythm and normal heart sounds.  Exam reveals no gallop and no friction rub.   No murmur heard. Pulmonary/Chest: Effort normal and breath sounds normal. No respiratory distress. He has no wheezes. He has no rales. He exhibits no tenderness.  Abdominal: Soft. Bowel sounds are normal. He exhibits no distension and no mass. There is no tenderness. There is no rebound and no guarding.  Musculoskeletal: Normal range of motion. He exhibits no edema.  Neurological: He is alert and oriented to person, place, and time.  Skin: Skin is warm. No rash noted. No erythema.  Psychiatric: Affect and judgment normal.      LABORATORY PANEL:   CBC No results for input(s): WBC, HGB, HCT, PLT in the last 168 hours. ------------------------------------------------------------------------------------------------------------------  Chemistries  No results for input(s): NA, K, CL, CO2, GLUCOSE, BUN,  CREATININE, CALCIUM, MG, AST, ALT, ALKPHOS, BILITOT in the last 168 hours.  Invalid input(s): GFRCGP ------------------------------------------------------------------------------------------------------------------  Cardiac Enzymes No results for input(s): TROPONINI in the last 168 hours. ------------------------------------------------------------------------------------------------------------------  RADIOLOGY:  No results found.  EKG:     IMPRESSION AND PLAN:   55 year old male who presented to Dr. Laurelyn Sickle office with chest pain and underwent stress test which was abnormal and today underwent cardiac catheterization with stent placed in the LAD.  1. Unstable angina with high-grade lesion in the mid LAD status post drug-eluting stent: Continue aspirin, Brillata, high intensity statin and Coreg  2. Tobacco dependence: Patient encouraged to stop smoking Conselled 3 minutes  3. Schizophrenia: Continue outpatient medications  All the records are reviewed and case  discussed with ED provider. Management plans discussed with the patient and he is in agreement  CODE STATUS: FULL  TOTAL TIME TAKING CARE OF THIS PATIENT: 45 minutes.    Innocence Schlotzhauer M.D on 06/10/2016 at 12:42 PM  Between 7am to 6pm - Pager - [phone]  After 6pm go to [email or web address] - password [email or web address]  Office  [phone]  CC: Primary care physician; Ronnell Freshwater, NP

## 2016-06-10 NOTE — Progress Notes (Signed)
Patient admitted to unit. Oriented to room, call bell, and staff. Bed in lowest position. Fall safety plan reviewed. Full assessment to Epic. Skin assessment verified with Earlyne Iba. RN. Telemetry box verification with tele clerk and Niki NT- Box#: [id number]. Will continue to monitor. Up time at 1730 per Belva Chimes RN, Specials.

## 2016-06-10 NOTE — Progress Notes (Signed)
Keith Gibbs is a 55 y.o. male  [id number]  Primary Cardiologist: Neoma Laming Reason for Consultation: Chest pain  HPI: This is a 55 year old white male with a past medical history of smoking one pack per day came to the emergency room this Monday with chest pain and ruled out for myocardial infarction was sent for stress test which was abnormal and then underwent CTA coronaries which showed 90% lesion in the mid LAD. Patient now had a cardiac catheterization which confirmed mid LAD lesion 90% and is about to have PCI and stenting with drug-eluting stent. Patient had chest pain 48 hours ago.   Review of Systems: No orthopnea PND or leg swelling   Past Medical History:  Diagnosis Date  . Anxiety   . Arthritis   . Bursitis   . COPD (chronic obstructive pulmonary disease) ([place])   . Depression   . Hepatitis C 2012   No longer has Hep C    Medications Prior to Admission  Medication Sig Dispense Refill  . acetaminophen (TYLENOL) 500 MG tablet Take 1,000 mg by mouth every 6 (six) hours as needed. For pain.    Marland Kitchen albuterol (PROAIR HFA) 108 (90 BASE) MCG/ACT inhaler Inhale 2 puffs into the lungs every 6 (six) hours as needed for wheezing or shortness of breath.     Marland Kitchen amoxicillin (AMOXIL) 875 MG tablet Take 1 tablet (875 mg total) by mouth 2 (two) times daily. 20 tablet 0  . ARIPiprazole (ABILIFY) 5 MG tablet Take 5 mg by mouth at bedtime.    . benzonatate (TESSALON PERLES) 100 MG capsule Take 2 capsules (200 mg total) by mouth 3 (three) times daily as needed for cough. 20 capsule 0  . busPIRone (BUSPAR) 10 MG tablet Take 10 mg by mouth 2 (two) times daily.    . cyclobenzaprine (FLEXERIL) 10 MG tablet Take 1 tablet (10 mg total) by mouth 3 (three) times daily as needed for muscle spasms. 21 tablet 0  . doxepin (SINEQUAN) 25 MG capsule Take 25 mg by mouth at bedtime.    . fexofenadine-pseudoephedrine (ALLEGRA-D) 60-120 MG 12 hr tablet Take 1 tablet by mouth 2 (two) times daily. 20 tablet  0  . Fluticasone-Salmeterol (ADVAIR DISKUS) 250-50 MCG/DOSE AEPB Inhale 1 puff into the lungs 2 (two) times daily.    . hydrOXYzine (ATARAX/VISTARIL) 50 MG tablet Take 50 mg by mouth 2 (two) times daily as needed for itching.    Marland Kitchen ibuprofen (ADVIL,MOTRIN) 600 MG tablet Take 1 tablet (600 mg total) by mouth every 6 (six) hours as needed. 30 tablet 0  . lamoTRIgine (LAMICTAL) 100 MG tablet Take 200 mg by mouth at bedtime.     Marland Kitchen omeprazole (PRILOSEC) 20 MG capsule Take 20 mg by mouth daily.    Marland Kitchen oxyCODONE-acetaminophen (PERCOCET) 5-325 MG tablet Take 2 tablets by mouth every 6 (six) hours as needed for moderate pain or severe pain. 12 tablet 0  . risperiDONE (RISPERDAL) 0.5 MG tablet Take 0.5 mg by mouth at bedtime.    Marland Kitchen rOPINIRole (REQUIP) 0.5 MG tablet Take 1-2 tablets by mouth 2 (two) times daily as needed.  1  . methylPREDNISolone (MEDROL DOSEPAK) 4 MG TBPK tablet Take Tapered dose as directed (Patient not taking: Reported on 03/15/2016) 21 tablet 0     . [START ON 06/11/2016] aspirin  81 mg Oral Pre-Cath  . sodium chloride flush  3 mL Intravenous Q12H    Infusions: . sodium chloride 200 mL/hr (06/10/16 1129)  . [START ON  06/11/2016] sodium chloride 3 mL/kg/hr (06/10/16 1044)   Followed by  . [START ON 06/11/2016] sodium chloride      Allergies  Allergen Reactions  . Latuda [Lurasidone Hcl]     tremor  . Naproxen Hives and Swelling  . Saphris [Asenapine]     Increased tremors    Social History   Social History  . Marital status: Single    Spouse name: N/A  . Number of children: N/A  . Years of education: N/A   Occupational History  . Not on file.   Social History Main Topics  . Smoking status: Current Every Day Smoker    Packs/day: 0.50    Types: Cigarettes    Last attempt to quit: 03/04/2012  . Smokeless tobacco: Never Used  . Alcohol use No  . Drug use: No  . Sexual activity: Not on file   Other Topics Concern  . Not on file   Social History Narrative  . No  narrative on file    Family History  Problem Relation Age of Onset  . Osteoarthritis Mother   . Heart disease Mother   . Hypertension Mother   . Heart failure Father   . Heart disease Father   . Early death Father   . Hypertension Father   . Hypertension Sister     PHYSICAL EXAM: Vitals:   06/10/16 1005  BP: 124/70  Pulse: (!) 55  Resp: 16  Temp: 98.4 F (36.9 C)    No intake or output data in the 24 hours ending 06/10/16 1207  General:  Well appearing. No respiratory difficulty HEENT: normal Neck: supple. no JVD. Carotids 2+ bilat; no bruits. No lymphadenopathy or thryomegaly appreciated. Cor: PMI nondisplaced. Regular rate & rhythm. No rubs, gallops or murmurs. Lungs: clear Abdomen: soft, nontender, nondistended. No hepatosplenomegaly. No bruits or masses. Good bowel sounds. Extremities: no cyanosis, clubbing, rash, edema Neuro: alert & oriented x 3, cranial nerves grossly intact. moves all 4 extremities w/o difficulty. Affect pleasant.  ECG: Sinus rhythm no acute changes  No results found for this or any previous visit (from the past 24 hour(s)). No results found.   ASSESSMENT AND PLAN: Unstable angina with high-grade lesion in the mid LAD for which a drug-eluting stent will be placed. Patient will get aspirin Valenta Lipitor 80 and beta blockers. Patient will be admitted overnight and be discharged in the morning on Saturday with follow-up in the office on Monday at 10:00. I spoke to Dr. Genia Harold and she'll be admitting the patient.  Sivan Quast A

## 2016-06-11 DIAGNOSIS — I25110 Atherosclerotic heart disease of native coronary artery with unstable angina pectoris: Secondary | ICD-10-CM | POA: Diagnosis not present

## 2016-06-11 DIAGNOSIS — I208 Other forms of angina pectoris: Secondary | ICD-10-CM | POA: Diagnosis present

## 2016-06-11 DIAGNOSIS — I2089 Other forms of angina pectoris: Secondary | ICD-10-CM | POA: Insufficient documentation

## 2016-06-11 LAB — LIPID PANEL
CHOL/HDL RATIO: 2.6 ratio
Cholesterol: 92 mg/dL (ref 0–200)
HDL: 36 mg/dL — ABNORMAL LOW (ref >40)
LDL Cholesterol: 46 mg/dL (ref 0–99)
Triglycerides: 49 mg/dL (ref <150)
VLDL: 10 mg/dL (ref 0–40)

## 2016-06-11 MED ORDER — TICAGRELOR 90 MG PO TABS: 90.0000 mg | ORAL_TABLET | Freq: Two times a day (BID) | ORAL | 0 refills | Status: DC

## 2016-06-11 MED ORDER — ATORVASTATIN CALCIUM 40 MG PO TABS: 40.0000 mg | ORAL_TABLET | Freq: Every day | ORAL | 0 refills | Status: DC

## 2016-06-11 MED ORDER — NITROGLYCERIN 0.4 MG SL SUBL: 0.4000 mg | SUBLINGUAL_TABLET | SUBLINGUAL | 0 refills | Status: DC | PRN

## 2016-06-11 NOTE — Discharge Summary (Signed)
[facility] [place] NAME: Keith Gibbs    MR#:  [id number]  DATE OF BIRTH:  1960/10/03  DATE OF ADMISSION:  06/10/2016 ADMITTING PHYSICIAN: Dionisio David, MD  DATE OF DISCHARGE: 06/11/2016 11:35 AM  PRIMARY CARE PHYSICIAN: Ronnell Freshwater, NP    ADMISSION DIAGNOSIS:  Unstable angina  DISCHARGE DIAGNOSIS:  Active Problems:   Chest pain   Angina at rest [facility])   SECONDARY DIAGNOSIS:   Past Medical History:  Diagnosis Date  . Anxiety   . Arthritis   . Bursitis   . COPD (chronic obstructive pulmonary disease) ([place])   . Depression   . Hepatitis C 2012   No longer has Hep C    HOSPITAL COURSE:   1. Acute coronary syndrome. Patient was observed in the hospital overnight after cardiac stent placement. Patient is already on aspirin. Lipitor and Brilinta were added. When necessary nitroglycerin given. Beta blocker contraindicated secondary to bradycardia. Patient will follow-up with cardiology next week. Referred to cardiac rehabilitation. Upon discharge the patient had no chest pain. He always has some shortness of breath with his COPD. His right groin site had no signs of bleeding. 2. COPD unspecified continue his usual inhalers. 3. Depression and anxiety. Patient on numerous psychiatric medications 4. History of hepatitis C 5. GERD on omeprazole  DISCHARGE CONDITIONS:   Satisfactory  CONSULTS OBTAINED:   cardiology  DRUG ALLERGIES:   Allergies  Allergen Reactions  . Latuda [Lurasidone Hcl]     tremor  . Naproxen Hives and Swelling  . Saphris [Asenapine]     Increased tremors    DISCHARGE MEDICATIONS:   Discharge Medication List as of 06/11/2016 11:12 AM    START taking these medications   Details  atorvastatin (LIPITOR) 40 MG tablet Take 1 tablet (40 mg total) by mouth daily at 6 PM., Starting Sat 06/11/2016, Print    nitroGLYCERIN (NITROSTAT) 0.4 MG SL tablet Place 1 tablet (0.4 mg total) under the tongue  every 5 (five) minutes x 3 doses as needed for chest pain., Starting Sat 06/11/2016, Print    ticagrelor (BRILINTA) 90 MG TABS tablet Take 1 tablet (90 mg total) by mouth 2 (two) times daily., Starting Sat 06/11/2016, Print      CONTINUE these medications which have NOT CHANGED   Details  acetaminophen (TYLENOL) 500 MG tablet Take 1,000 mg by mouth every 6 (six) hours as needed. For pain., Until Discontinued, Historical Med    albuterol (PROAIR HFA) 108 (90 BASE) MCG/ACT inhaler Inhale 2 puffs into the lungs every 6 (six) hours as needed for wheezing or shortness of breath. , Starting 05/28/2012, Until Discontinued, Historical Med    doxepin (SINEQUAN) 25 MG capsule Take 25 mg by mouth at bedtime., Historical Med    Fluticasone-Salmeterol (ADVAIR DISKUS) 250-50 MCG/DOSE AEPB Inhale 1 puff into the lungs 2 (two) times daily., Starting Tue 03/20/2012, Historical Med    hydrOXYzine (ATARAX/VISTARIL) 50 MG tablet Take 50 mg by mouth 2 (two) times daily as needed for itching., Historical Med    omeprazole (PRILOSEC) 20 MG capsule Take 20 mg by mouth daily., Until Discontinued, Historical Med    rOPINIRole (REQUIP) 0.5 MG tablet Take 1-2 tablets by mouth 2 (two) times daily as needed., Starting Wed 08/05/2015, Historical Med    ARIPiprazole (ABILIFY) 5 MG tablet Take 5 mg by mouth at bedtime., Historical Med    busPIRone (BUSPAR) 10 MG tablet Take 10 mg by mouth 2 (two) times daily., Historical  Med    lamoTRIgine (LAMICTAL) 100 MG tablet Take 200 mg by mouth at bedtime. , Historical Med    risperiDONE (RISPERDAL) 0.5 MG tablet Take 0.5 mg by mouth at bedtime., Historical Med         DISCHARGE INSTRUCTIONS:   Follow-up with cardiology this week Follow-up with PMD 1-2 weeks  If you experience worsening of your admission symptoms, develop shortness of breath, life threatening emergency, suicidal or homicidal thoughts you must seek medical attention immediately by calling 911 or calling your  MD immediately  if symptoms less severe.  You Must read complete instructions/literature along with all the possible adverse reactions/side effects for all the Medicines you take and that have been prescribed to you. Take any new Medicines after you have completely understood and accept all the possible adverse reactions/side effects.   Please note  You were cared for by a hospitalist during your hospital stay. If you have any questions about your discharge medications or the care you received while you were in the hospital after you are discharged, you can call the unit and asked to speak with the hospitalist on call if the hospitalist that took care of you is not available. Once you are discharged, your primary care physician will handle any further medical issues. Please note that NO REFILLS for any discharge medications will be authorized once you are discharged, as it is imperative that you return to your primary care physician (or establish a relationship with a primary care physician if you do not have one) for your aftercare needs so that they can reassess your need for medications and monitor your lab values.    Today   CHIEF COMPLAINT:  No chief complaint on file.   HISTORY OF PRESENT ILLNESS:  Keith Gibbs  is a 55 y.o. male was observed in the hospital after cardiac stent placement.   VITAL SIGNS:  Blood pressure 104/64, pulse (!) 46, temperature 97.8 F (36.6 C), temperature source Oral, resp. rate 18, height 6\' 3"  (1.905 m), weight 90.7 kg (200 lb), SpO2 100 %.  I/O:   Intake/Output Summary (Last 24 hours) at 06/11/16 1612 Last data filed at 06/11/16 1004  Gross per 24 hour  Intake          1436.67 ml  Output             1350 ml  Net            86.67 ml    PHYSICAL EXAMINATION:  GENERAL:  55 y.o.-year-old patient lying in the bed with no acute distress.  EYES: Pupils equal, round, reactive to light and accommodation. No scleral icterus. Extraocular muscles intact.   HEENT: Head atraumatic, normocephalic. Oropharynx and nasopharynx clear.  NECK:  Supple, no jugular venous distention. No thyroid enlargement, no tenderness.  LUNGS: Decreased breath sounds bilaterally, no wheezing, rales,rhonchi or crepitation. No use of accessory muscles of respiration.  CARDIOVASCULAR: S1, S2 Bradycardia. No murmurs, rubs, or gallops.  ABDOMEN: Soft, non-tender, non-distended. Bowel sounds present. No organomegaly or mass.  EXTREMITIES: No pedal edema, cyanosis, or clubbing.  NEUROLOGIC: Cranial nerves II through XII are intact. Muscle strength 5/5 in all extremities. Sensation intact. Gait not checked.  PSYCHIATRIC: The patient is alert and oriented x 3.  SKIN: No obvious rash, lesion, or ulcer.   DATA REVIEW:   CBC  Recent Labs Lab 06/10/16 1641  WBC 5.9  HGB 15.4  HCT 43.3  PLT 174    Chemistries   Recent Labs Lab  06/10/16 1641  CREATININE 0.87    Microbiology Results  Results for orders placed or performed during the hospital encounter of 06/10/16  MRSA PCR Screening     Status: None   Collection Time: 06/10/16  5:19 PM  Result Value Ref Range Status   MRSA by PCR NEGATIVE NEGATIVE Final    Comment:        The GeneXpert MRSA Assay (FDA approved for NASAL specimens only), is one component of a comprehensive MRSA colonization surveillance program. It is not intended to diagnose MRSA infection nor to guide or monitor treatment for MRSA infections.     Management plans discussed with the patient, family and they are in agreement.  CODE STATUS:  Code Status History    Date Active Date Inactive Code Status Order ID Comments User Context   06/10/2016  1:29 PM 06/11/2016  3:42 PM Full Code [id number]  Dionisio David, MD Inpatient   06/10/2016 12:08 PM 06/10/2016  1:29 PM Full Code [id number]  Bettey Costa, MD Inpatient      TOTAL TIME TAKING CARE OF THIS PATIENT: 35 minutes.    Loletha Grayer M.D on 06/11/2016 at 4:12 PM  Between 7am to  6pm - Pager - [phone]  After 6pm go to [email or web address] - password [email or web address]  [facility] Office  [phone]  CC: Primary care physician; Ronnell Freshwater, NP

## 2016-06-11 NOTE — Progress Notes (Signed)
Patient alert and oriented able to make needs known. Patient heart rate noted to be in the high 30's per CCMD, patient remains asymptomatic, attending made aware, nno. Right groin area clean, dry and intact, no bleeding, no hematoma noted. Patient denies pain at site, ambulate without any difficulty. Discharge instructions and scripts  given and explained to patient, verbalized understanding. Patient stable, no distress noted.

## 2016-06-11 NOTE — Progress Notes (Signed)
  SUBJECTIVE: Pt aroused from sleep. He is alert and oriented and states that he slept well. No further chest pain since stent placement. Groin is a little tender.   Vitals:   06/10/16 1623 06/10/16 1648 06/10/16 2150 06/11/16 0403  BP: 112/68 110/60 126/63 (!) 109/55  Pulse: (!) 43 (!) 41 (!) 51 (!) 48  Resp: 16 12 16 18   Temp:  98.4 F (36.9 C) 98.4 F (36.9 C) 98.1 F (36.7 C)  TempSrc:  Oral Oral Oral  SpO2: 96% 97% 97% 97%  Weight:      Height:        Intake/Output Summary (Last 24 hours) at 06/11/16 0610 Last data filed at 06/11/16 0100  Gross per 24 hour  Intake              490 ml  Output              950 ml  Net             -460 ml    LABS: Basic Metabolic Panel:  Recent Labs  06/10/16 1641  CREATININE 0.87   Liver Function Tests: No results for input(s): AST, ALT, ALKPHOS, BILITOT, PROT, ALBUMIN in the last 72 hours. No results for input(s): LIPASE, AMYLASE in the last 72 hours. CBC:  Recent Labs  06/10/16 1641  WBC 5.9  HGB 15.4  HCT 43.3  MCV 93.4  PLT 174   Cardiac Enzymes: No results for input(s): CKTOTAL, CKMB, CKMBINDEX, TROPONINI in the last 72 hours. BNP: Invalid input(s): POCBNP D-Dimer: No results for input(s): DDIMER in the last 72 hours. Hemoglobin A1C: No results for input(s): HGBA1C in the last 72 hours. Fasting Lipid Panel: No results for input(s): CHOL, HDL, LDLCALC, TRIG, CHOLHDL, LDLDIRECT in the last 72 hours. Thyroid Function Tests: No results for input(s): TSH, T4TOTAL, T3FREE, THYROIDAB in the last 72 hours.  Invalid input(s): FREET3 Anemia Panel: No results for input(s): VITAMINB12, FOLATE, FERRITIN, TIBC, IRON, RETICCTPCT in the last 72 hours.   PHYSICAL EXAM General: Well developed, well nourished, in no acute distress HEENT:  Normocephalic and atramatic Neck:  No JVD.  Lungs: Clear bilaterally to auscultation and percussion. Heart: HRRR . Normal S1 and S2 without gallops or murmurs.  Abdomen: Bowel sounds are  positive, abdomen soft and non-tender  Msk:  Back normal, normal gait. Normal strength and tone for age. Extremities: No clubbing, cyanosis or edema.   Neuro: Alert and oriented X 3. Psych:  Good affect, responds appropriately  TELEMETRY: Sinus bradycardia in the 40's.  ASSESSMENT AND PLAN: Unstable angina with high-grade lesion in the mid LAD for which a drug-eluting stent was placed. Patient has been started on  Brilinta, aspirin, lipitor and betablocker. Heart rate has been in the 40's. Advise holding betablocker until follow up and increase lipitor to 80 mg. His groin is tender, but soft, without bleeding and without bruit.  Patient is stable for discharge. He should follow up at [facility] on Monday 06/13/2016 at 10:00 for groin check and hospital follow up.  Active Problems:   Chest pain    Daune Perch, NP 06/11/2016 6:10 AM

## 2016-06-13 ENCOUNTER — Encounter: Payer: Self-pay | Admitting: Cardiovascular Disease

## 2016-06-19 ENCOUNTER — Emergency Department: Payer: Medicaid Other

## 2016-06-19 ENCOUNTER — Emergency Department
Admission: EM | Admit: 2016-06-19 | Discharge: 2016-06-19 | Disposition: A | Discharge status: Home / Self Care | Payer: Medicaid Other | Attending: Emergency Medicine | Admitting: Emergency Medicine

## 2016-06-19 DIAGNOSIS — S93602A Unspecified sprain of left foot, initial encounter: Secondary | ICD-10-CM

## 2016-06-19 DIAGNOSIS — F17210 Nicotine dependence, cigarettes, uncomplicated: Secondary | ICD-10-CM | POA: Insufficient documentation

## 2016-06-19 DIAGNOSIS — Z79899 Other long term (current) drug therapy: Secondary | ICD-10-CM | POA: Insufficient documentation

## 2016-06-19 DIAGNOSIS — Y9389 Activity, other specified: Secondary | ICD-10-CM | POA: Insufficient documentation

## 2016-06-19 DIAGNOSIS — Y999 Unspecified external cause status: Secondary | ICD-10-CM | POA: Insufficient documentation

## 2016-06-19 DIAGNOSIS — J449 Chronic obstructive pulmonary disease, unspecified: Secondary | ICD-10-CM | POA: Insufficient documentation

## 2016-06-19 DIAGNOSIS — S93402A Sprain of unspecified ligament of left ankle, initial encounter: Secondary | ICD-10-CM | POA: Diagnosis not present

## 2016-06-19 DIAGNOSIS — W11XXXA Fall on and from ladder, initial encounter: Secondary | ICD-10-CM | POA: Diagnosis not present

## 2016-06-19 DIAGNOSIS — Y92009 Unspecified place in unspecified non-institutional (private) residence as the place of occurrence of the external cause: Secondary | ICD-10-CM | POA: Insufficient documentation

## 2016-06-19 DIAGNOSIS — S99912A Unspecified injury of left ankle, initial encounter: Secondary | ICD-10-CM | POA: Diagnosis present

## 2016-06-19 MED ORDER — HYDROCODONE-ACETAMINOPHEN 5-325 MG PO TABS: 1.0000 | ORAL_TABLET | Freq: Two times a day (BID) | ORAL | 0 refills | Status: DC | PRN

## 2016-06-19 MED ORDER — NABUMETONE 750 MG PO TABS: 750.0000 mg | ORAL_TABLET | Freq: Two times a day (BID) | ORAL | 0 refills | Status: DC

## 2016-06-19 MED ORDER — HYDROCODONE-ACETAMINOPHEN 5-325 MG PO TABS
1.0000 | ORAL_TABLET | Freq: Once | ORAL | Status: AC
  Administered 2016-06-19: 1 via ORAL
  Filled 2016-06-19: qty 1

## 2016-06-19 NOTE — ED Triage Notes (Signed)
Pt states that he fell off the ladder putting up lights, approx 8 rungs, pt reports pain to the left knee and left foot, pt reports it happened around 3 hours ago, pt states that his foot has cont to hurt worse and worse, despite taking ibuprofen, wrapping it with an ace, and icing it

## 2016-06-19 NOTE — Discharge Instructions (Signed)
Your exam and x-rays are negative for fracture to the foot or ankle. You have sprained the ankle and foot. Wear the ankle brace as needed for support. Apply ice to reduce swelling. Take the prescription pain medicine and anti-inflammatory as directed. Follow-up with Dr. Sabra Heck for continued symptoms.

## 2016-06-19 NOTE — ED Provider Notes (Signed)
[facility] Emergency Department Provider Note ____________________________________________  Time seen: 2045  I have reviewed the triage vital signs and the nursing notes.  HISTORY  Chief Complaint  Foot Pain  HPI Keith Gibbs is a 55 y.o. male visits to the ED, got by family member for evaluation of left foot and ankle pain status post a fall.the patient describes climbing on a ladder began about 6 rungs up, hanging Christmas lights. He describes the ladder sliding out along the side of the house as he fell with it. He describes landing on his left foot and knee an to left foot.He denies any head injury, loss of consciousness, laceration. He sustained a laceration to the knee reports pain or stiffness to the left foot and ankle. The accident occurred about 3 hours prior to arrival. The patient has the ankle/foot wrapped with an Ace bandage upon arrival.  Past Medical History:  Diagnosis Date  . Anxiety   . Arthritis   . Bursitis   . COPD (chronic obstructive pulmonary disease) ([place])   . Depression   . Hepatitis C 2012   No longer has Hep C    Patient Active Problem List   Diagnosis Date Noted  . Angina at rest [facility]) 06/11/2016  . Chest pain 06/10/2016  . Bipolar 1 disorder ([place]) 05/27/2013  . Myalgia and myositis 05/27/2013  . Abnormal ejaculation 11/30/2012  . Chest pain, unspecified 11/30/2012  . Degenerative arthritis of hip 11/16/2012  . Hip pain, left 08/29/2012  . Swollen testicle 07/27/2012  . Schizophrenia ([place]) 06/27/2012  . Osteoarthritis 06/27/2012  . Shoulder pain 06/27/2012  . COPD (chronic obstructive pulmonary disease) ([place]) 06/27/2012  . Chronic hepatitis C ([place]) 06/27/2012  . GERD (gastroesophageal reflux disease) 06/27/2012  . Complete rupture of rotator cuff 02/10/2012  . Cocaine abuse in remission 12/05/2011    Past Surgical History:  Procedure Laterality Date  . ABDOMINAL SURGERY     Intestines  . APPENDECTOMY    .  BACK SURGERY    . CARDIAC CATHETERIZATION Left 06/10/2016   Procedure: Left Heart Cath and Coronary Angiography;  Surgeon: Dionisio David, MD;  Location: [place] CV LAB;  Service: Cardiovascular;  Laterality: Left;  . CARDIAC CATHETERIZATION N/A 06/10/2016   Procedure: Coronary Stent Intervention;  Surgeon: Yolonda Kida, MD;  Location: [place] CV LAB;  Service: Cardiovascular;  Laterality: N/A;  . SHOULDER SURGERY  04/09/2012    Prior to Admission medications   Medication Sig Start Date End Date Taking? Authorizing Provider  acetaminophen (TYLENOL) 500 MG tablet Take 1,000 mg by mouth every 6 (six) hours as needed. For pain.    Historical Provider, MD  albuterol (PROAIR HFA) 108 (90 BASE) MCG/ACT inhaler Inhale 2 puffs into the lungs every 6 (six) hours as needed for wheezing or shortness of breath.  05/28/12   Historical Provider, MD  ARIPiprazole (ABILIFY) 5 MG tablet Take 5 mg by mouth at bedtime.    Historical Provider, MD  atorvastatin (LIPITOR) 40 MG tablet Take 1 tablet (40 mg total) by mouth daily at 6 PM. 06/11/16   Loletha Grayer, MD  busPIRone (BUSPAR) 10 MG tablet Take 10 mg by mouth 2 (two) times daily.    Historical Provider, MD  doxepin (SINEQUAN) 25 MG capsule Take 25 mg by mouth at bedtime.    Historical Provider, MD  Fluticasone-Salmeterol (ADVAIR DISKUS) 250-50 MCG/DOSE AEPB Inhale 1 puff into the lungs 2 (two) times daily. 03/20/12   Historical Provider, MD  HYDROcodone-acetaminophen (  NORCO) 5-325 MG tablet Take 1 tablet by mouth 2 (two) times daily as needed. 06/19/16   Shaine Newmark V Bacon Shannan Garfinkel, PA-C  hydrOXYzine (ATARAX/VISTARIL) 50 MG tablet Take 50 mg by mouth 2 (two) times daily as needed for itching.    Historical Provider, MD  lamoTRIgine (LAMICTAL) 100 MG tablet Take 200 mg by mouth at bedtime.     Historical Provider, MD  nabumetone (RELAFEN) 750 MG tablet Take 1 tablet (750 mg total) by mouth 2 (two) times daily. 06/19/16   Pegi Milazzo V Bacon Casyn Becvar, PA-C   nitroGLYCERIN (NITROSTAT) 0.4 MG SL tablet Place 1 tablet (0.4 mg total) under the tongue every 5 (five) minutes x 3 doses as needed for chest pain. 06/11/16   Loletha Grayer, MD  omeprazole (PRILOSEC) 20 MG capsule Take 20 mg by mouth daily.    Historical Provider, MD  risperiDONE (RISPERDAL) 0.5 MG tablet Take 0.5 mg by mouth at bedtime.    Historical Provider, MD  rOPINIRole (REQUIP) 0.5 MG tablet Take 1-2 tablets by mouth 2 (two) times daily as needed. 08/05/15   Historical Provider, MD  ticagrelor (BRILINTA) 90 MG TABS tablet Take 1 tablet (90 mg total) by mouth 2 (two) times daily. 06/11/16   Loletha Grayer, MD    Allergies Anette Guarneri [lurasidone hcl]; Naproxen; and Saphris [asenapine]  Family History  Problem Relation Age of Onset  . Osteoarthritis Mother   . Heart disease Mother   . Hypertension Mother   . Heart failure Father   . Heart disease Father   . Early death Father   . Hypertension Father   . Hypertension Sister     Social History Social History  Substance Use Topics  . Smoking status: Current Every Day Smoker    Packs/day: 0.50    Types: Cigarettes    Last attempt to quit: 03/04/2012  . Smokeless tobacco: Never Used  . Alcohol use No    Review of Systems  Constitutional: Negative for fever. Cardiovascular: Negative for chest pain. Respiratory: Negative for shortness of breath. Musculoskeletal: Negative for back pain. Left foot and ankle pain as above. Skin: Negative for rash. The abrasion as noted. Neurological: Negative for headaches, focal weakness or numbness. ____________________________________________  PHYSICAL EXAM:  VITAL SIGNS: ED Triage Vitals  Enc Vitals Group     BP 06/19/16 2012 (!) 142/84     Pulse Rate 06/19/16 2012 66     Resp 06/19/16 2012 18     Temp 06/19/16 2012 98.4 F (36.9 C)     Temp Source 06/19/16 2012 Oral     SpO2 06/19/16 2012 95 %     Weight 06/19/16 2012 198 lb (89.8 kg)     Height 06/19/16 2012 6\' 3"  (1.905 m)      Head Circumference --      Peak Flow --      Pain Score 06/19/16 2013 10     Pain Loc --      Pain Edu? --      Excl. in [place]? --     Constitutional: Alert and oriented. Well appearing and in no distress. Head: Normocephalic and atraumatic. Cardiovascular: Normal distal pulses. Respiratory: Normal respiratory effort. Musculoskeletal: Left foot and ankle without obvious deformity, dislocation, or effusion. Patient with pain localized to the lateral malleolus and the posterior ankle at the Achilles tendon. He has normal ankle range of motion and negative drawer sign. No significant calf or gastroc deficit is appreciated. Small abrasion noted to the anterior left knee. Normal knee exam  without signs of effusion, dislocation, or  internal derangement. Nontender with normal range of motion in all extremities.  Neurologic: Normal speech and language. No gross focal neurologic deficits are appreciated. Skin:  Skin is warm, dry and intact. No rash noted. Psychiatric: Mood and affect are normal. Patient exhibits appropriate insight and judgment. ____________________________________________   RADIOLOGY  Left Foot IMPRESSION: Negative left foot radiographs.  Left Ankle IMPRESSION: Normal left ankle  I, Cardelia Sassano, Dannielle Karvonen, personally viewed and evaluated these images (plain radiographs) as part of my medical decision making, as well as reviewing the written report by the radiologist. ____________________________________________  PROCEDURES  Velcro stirrup splint Norco 5-325 mg PO ____________________________________________  INITIAL IMPRESSION / ASSESSMENT AND PLAN / ED COURSE  Patient with a fall resulting in a left ankle and foot sprain without radiologic evidence of fracture or dislocation. Patient is fitted with an ankle stirrup splint and discharged with prescriptions for Relafen and Norco #10. He will follow-up with his primary care provider or Dr. Earnestine Leys for ongoing  symptom management.  Clinical Course    ____________________________________________  FINAL CLINICAL IMPRESSION(S) / ED DIAGNOSES  Final diagnoses:  Sprain of left ankle, unspecified ligament, initial encounter  Sprain of left foot, initial encounter      Melvenia Needles, PA-C 06/19/16 2358    Melvenia Needles, PA-C 06/19/16 2358    Nance Pear, MD 06/20/16 1540

## 2016-06-29 ENCOUNTER — Ambulatory Visit: Payer: Medicaid Other | Admitting: Urology

## 2016-06-30 ENCOUNTER — Ambulatory Visit: Payer: Medicaid Other

## 2016-07-06 ENCOUNTER — Encounter: Payer: Self-pay | Admitting: Urology

## 2016-07-06 ENCOUNTER — Ambulatory Visit (INDEPENDENT_AMBULATORY_CARE_PROVIDER_SITE_OTHER): Payer: Medicaid Other | Admitting: Urology

## 2016-07-06 VITALS — BP 147/74 | HR 56 | Ht 75.0 in | Wt 197.9 lb

## 2016-07-06 DIAGNOSIS — R3129 Other microscopic hematuria: Secondary | ICD-10-CM

## 2016-07-06 DIAGNOSIS — N138 Other obstructive and reflux uropathy: Secondary | ICD-10-CM | POA: Diagnosis not present

## 2016-07-06 DIAGNOSIS — N4289 Other specified disorders of prostate: Secondary | ICD-10-CM | POA: Diagnosis not present

## 2016-07-06 DIAGNOSIS — N50819 Testicular pain, unspecified: Secondary | ICD-10-CM | POA: Diagnosis not present

## 2016-07-06 DIAGNOSIS — N401 Enlarged prostate with lower urinary tract symptoms: Secondary | ICD-10-CM

## 2016-07-06 LAB — URINALYSIS, COMPLETE
BILIRUBIN UA: NEGATIVE
Glucose, UA: NEGATIVE
KETONES UA: NEGATIVE
Leukocytes, UA: NEGATIVE
Nitrite, UA: NEGATIVE
PH UA: 5.5 (ref 5.0–7.5)
PROTEIN UA: NEGATIVE
SPEC GRAV UA: 1.02 (ref 1.005–1.030)
UUROB: 0.2 mg/dL (ref 0.2–1.0)

## 2016-07-06 LAB — MICROSCOPIC EXAMINATION
BACTERIA UA: NONE SEEN
WBC UA: NONE SEEN /HPF (ref 0–?)

## 2016-07-06 MED ORDER — TAMSULOSIN HCL 0.4 MG PO CAPS: 0.4000 mg | ORAL_CAPSULE | Freq: Every day | ORAL | 11 refills | Status: DC

## 2016-07-06 NOTE — Progress Notes (Signed)
07/06/2016 2:48 PM   Keith Gibbs Oct 25, 1960 [id number]  Referring provider: Ronnell Freshwater, NP [place], [place] [address]  Chief Complaint  Patient presents with  . Testicle Pain    right    HPI: 54 year old male who presents today for further evaluation of right scrotal pain.  He reports a long history of intermittent bilateral testicular swelling which comes and goes. This is been going on for many years. He denies any swelling today or recently. His primary complaint includes a pulling sensation in his right testicle which is chronic.  He reports that this occurs primarily when he has an erection. The pain can be severe.    History of inguinal hernias. Per his records, he does have a history of cirrhosis but he reports that this has improved/resolved with treatment of his hep C. No history of ascites.  He does have multiple urinary complaints today including urinary urgency, intermittency, difficulty starting his stream. He also has pain in his lower abdomen/pelvis occasionally when voiding. He denies any overt dysuria. No penile discharge.   He denies a history of urinary tract infections or gross hematuria.  He is a current smoker.  Past medical history significant for CAD s/p recent PCI, COPD, history of hep C status post treatment, cocaine abuse, schizophrenia and tobacco abuse.   PMH: Past Medical History:  Diagnosis Date  . Anxiety   . Arthritis   . Bursitis   . COPD (chronic obstructive pulmonary disease) ([place])   . Depression   . Hepatitis C 2012   No longer has Hep C    Surgical History: Past Surgical History:  Procedure Laterality Date  . ABDOMINAL SURGERY     Intestines  . APPENDECTOMY    . BACK SURGERY    . CARDIAC CATHETERIZATION Left 06/10/2016   Procedure: Left Heart Cath and Coronary Angiography;  Surgeon: Dionisio David, MD;  Location: [place] CV LAB;  Service: Cardiovascular;  Laterality: Left;  . CARDIAC  CATHETERIZATION N/A 06/10/2016   Procedure: Coronary Stent Intervention;  Surgeon: Yolonda Kida, MD;  Location: [place] [facility] LAB;  Service: Cardiovascular;  Laterality: N/A;  . SHOULDER SURGERY  04/09/2012    Home Medications:  Allergies as of 07/06/2016      Reactions   Latuda [lurasidone Hcl]    tremor   Naproxen Hives, Swelling   Saphris [asenapine]    Increased tremors      Medication List       Accurate as of 07/06/16 11:59 PM. Always use your most recent med list.          acetaminophen 500 MG tablet Commonly known as:  TYLENOL Take 1,000 mg by mouth every 6 (six) hours as needed. For pain.   ADVAIR DISKUS 250-50 MCG/DOSE Aepb Generic drug:  Fluticasone-Salmeterol Inhale 1 puff into the lungs 2 (two) times daily.   ARIPiprazole 5 MG tablet Commonly known as:  ABILIFY Take 5 mg by mouth at bedtime.   atorvastatin 40 MG tablet Commonly known as:  LIPITOR Take 1 tablet (40 mg total) by mouth daily at 6 PM.   busPIRone 10 MG tablet Commonly known as:  BUSPAR Take 10 mg by mouth 2 (two) times daily.   doxepin 25 MG capsule Commonly known as:  SINEQUAN Take 25 mg by mouth at bedtime.   HYDROcodone-acetaminophen 5-325 MG tablet Commonly known as:  NORCO Take 1 tablet by mouth 2 (two) times daily as needed.   hydrOXYzine 50 MG tablet  Commonly known as:  ATARAX/VISTARIL Take 50 mg by mouth 2 (two) times daily as needed for itching.   lamoTRIgine 100 MG tablet Commonly known as:  LAMICTAL Take 200 mg by mouth at bedtime.   nabumetone 750 MG tablet Commonly known as:  RELAFEN Take 1 tablet (750 mg total) by mouth 2 (two) times daily.   nitroGLYCERIN 0.4 MG SL tablet Commonly known as:  NITROSTAT Place 1 tablet (0.4 mg total) under the tongue every 5 (five) minutes x 3 doses as needed for chest pain.   omeprazole 20 MG capsule Commonly known as:  PRILOSEC Take 20 mg by mouth daily.   PROAIR HFA 108 (90 Base) MCG/ACT inhaler Generic drug:   albuterol Inhale 2 puffs into the lungs every 6 (six) hours as needed for wheezing or shortness of breath.   risperiDONE 0.5 MG tablet Commonly known as:  RISPERDAL Take 0.5 mg by mouth at bedtime.   rOPINIRole 0.5 MG tablet Commonly known as:  REQUIP Take 1-2 tablets by mouth 2 (two) times daily as needed.   tamsulosin 0.4 MG Caps capsule Commonly known as:  FLOMAX Take 1 capsule (0.4 mg total) by mouth daily.   ticagrelor 90 MG Tabs tablet Commonly known as:  BRILINTA Take 1 tablet (90 mg total) by mouth 2 (two) times daily.       Allergies:  Allergies  Allergen Reactions  . Latuda [Lurasidone Hcl]     tremor  . Naproxen Hives and Swelling  . Saphris [Asenapine]     Increased tremors    Family History: Family History  Problem Relation Age of Onset  . Osteoarthritis Mother   . Heart disease Mother   . Hypertension Mother   . Heart failure Father   . Heart disease Father   . Early death Father   . Hypertension Father   . Hypertension Sister   . Prostate cancer Neg Hx   . Bladder Cancer Neg Hx   . Kidney cancer Neg Hx     Social History:  reports that he has been smoking Cigarettes.  He has been smoking about 0.50 packs per day. He has never used smokeless tobacco. He reports that he does not drink alcohol or use drugs.  ROS: UROLOGY Frequent Urination?: Yes Hard to postpone urination?: Yes Burning/pain with urination?: No Get up at night to urinate?: No Leakage of urine?: No Urine stream starts and stops?: Yes Trouble starting stream?: Yes Do you have to strain to urinate?: No Blood in urine?: No Urinary tract infection?: No Sexually transmitted disease?: No Injury to kidneys or bladder?: No Painful intercourse?: No Weak stream?: No Erection problems?: No Penile pain?: No  Gastrointestinal Nausea?: Yes Vomiting?: No Indigestion/heartburn?: Yes Diarrhea?: No Constipation?: No  Constitutional Fever: No Night sweats?: Yes Weight loss?:  Yes Fatigue?: No  Skin Skin rash/lesions?: No Itching?: No  Eyes Blurred vision?: Yes Double vision?: No  Ears/Nose/Throat Sore throat?: No Sinus problems?: Yes  Hematologic/Lymphatic Swollen glands?: No Easy bruising?: Yes  Cardiovascular Leg swelling?: No Chest pain?: Yes  Respiratory Cough?: Yes Shortness of breath?: Yes  Endocrine Excessive thirst?: Yes  Musculoskeletal Back pain?: Yes Joint pain?: Yes  Neurological Headaches?: No Dizziness?: Yes  Psychologic Depression?: No Anxiety?: No  Physical Exam: BP (!) 147/74 (BP Location: Left Arm, Patient Position: Sitting, Cuff Size: Normal)   Pulse (!) 56   Ht 6\' 3"  (1.905 m)   Wt 197 lb 14.4 oz (89.8 kg)   BMI 24.74 kg/m   Constitutional:  Alert  and oriented, No acute distress. HEENT: [place] AT, moist mucus membranes.  Trachea midline, no masses. Cardiovascular: No clubbing, cyanosis, or edema. Respiratory: Normal respiratory effort, no increased work of breathing. GI: Abdomen is soft, nontender, nondistended, no abdominal masses.  No obvious inguinal hernias palpated, examined in standing position with valsalva. GU: Normal scrotum with bilateral descended testicles, significant testicular asymmetry with right testicle somewhat larger in size than the left, no obvious hydrocele. Testicles nontender. No palpable intratesticular masses. Circumcised phallus with orthotopic meatus, no urethral discharge appreciated. Rectal:  Normal sphincter tone, 50 cc plus prostate, asymmetry of the left base compared to the right, rubbery, no obvious nodules. Skin: No rashes, bruises or suspicious lesions. Lymph: No cervical or inguinal adenopathy. Neurologic: Grossly intact, no focal deficits, moving all 4 extremities. Psychiatric: Normal mood and affect.  Laboratory Data: Lab Results  Component Value Date   WBC 5.9 06/10/2016   HGB 15.4 06/10/2016   HCT 43.3 06/10/2016   MCV 93.4 06/10/2016   PLT 174 06/10/2016     Lab Results  Component Value Date   CREATININE 0.87 06/10/2016    No results found for: PSA  No results found for: TESTOSTERONE  Lab Results  Component Value Date   HGBA1C 5.1 11/30/2012    Urinalysis Results for orders placed or performed in visit on 07/06/16  Microscopic Examination  Result Value Ref Range   WBC, UA None seen 0 - 5 /hpf   RBC, UA 3-10 (A) 0 - 2 /hpf   Epithelial Cells (non renal) 0-10 0 - 10 /hpf   Bacteria, UA None seen None seen/Few  Urinalysis, Complete  Result Value Ref Range   Specific Gravity, UA 1.020 1.005 - 1.030   pH, UA 5.5 5.0 - 7.5   Color, UA Yellow Yellow   Appearance Ur Hazy (A) Clear   Leukocytes, UA Negative Negative   Protein, UA Negative Negative/Trace   Glucose, UA Negative Negative   Ketones, UA Negative Negative   RBC, UA 1+ (A) Negative   Bilirubin, UA Negative Negative   Urobilinogen, Ur 0.2 0.2 - 1.0 mg/dL   Nitrite, UA Negative Negative   Microscopic Examination See below:   PSA  Result Value Ref Range   Prostate Specific Ag, Serum 0.9 0.0 - 4.0 ng/mL    Pertinent Imaging: Scrotal ultrasound report from 03/30/16 performed at [facility]- unable to view images and report limited  Assessment & Plan:    1. Testicular pain Asymmetry of the right testicle, larger than the left but no discrete masses. Concern for possible resolving epididymoorchitis versus intratesticular pathology Will evaluate further with formal scrotal ultrasound - Urinalysis, Complete - US Scrotum; Future - [place] Art/Ven Flow Abd Pelv Doppler; Future  2. BPH with obstruction/lower urinary tract symptoms Recommend trial of Flomax for urinary symptoms Side effects of medication were reviewed  3. Microscopic hematuria Incidental microscopic hematuria today on urinalysis. At this time, he does not yet meet the microscopic hematuria guidelines, however, we will recheck urine at next visit. If microscopic hematuria persists, consider cystoscopy  at the time of next visit  4. Prostate asymmetry Thickened asymmetry on prostate exam today, left base greater than right We'll correlate with PSA -PSA   Return in about 4 weeks (around 08/03/2016) for follow up scrotal ultrasound, repeat UA, possible cysto.  Hollice Espy, MD  [facility] [address], [place], [place] [address] [phone]

## 2016-07-07 LAB — PSA: PROSTATE SPECIFIC AG, SERUM: 0.9 ng/mL (ref 0.0–4.0)

## 2016-07-27 ENCOUNTER — Ambulatory Visit
Admission: RE | Admit: 2016-07-27 | Discharge: 2016-07-27 | Disposition: A | Discharge status: Home / Self Care | Payer: Medicaid Other | Source: Ambulatory Visit | Attending: Urology | Admitting: Urology

## 2016-07-27 DIAGNOSIS — N50819 Testicular pain, unspecified: Secondary | ICD-10-CM | POA: Insufficient documentation

## 2016-08-10 ENCOUNTER — Other Ambulatory Visit: Payer: Medicaid Other | Admitting: Urology

## 2016-08-22 ENCOUNTER — Ambulatory Visit (INDEPENDENT_AMBULATORY_CARE_PROVIDER_SITE_OTHER): Payer: Medicaid Other | Admitting: Urology

## 2016-08-22 ENCOUNTER — Encounter: Payer: Self-pay | Admitting: Urology

## 2016-08-22 VITALS — BP 136/81 | HR 64 | Ht 75.0 in | Wt 198.3 lb

## 2016-08-22 DIAGNOSIS — R3129 Other microscopic hematuria: Secondary | ICD-10-CM | POA: Diagnosis not present

## 2016-08-22 LAB — URINALYSIS, COMPLETE
BILIRUBIN UA: NEGATIVE
GLUCOSE, UA: NEGATIVE
KETONES UA: NEGATIVE
Leukocytes, UA: NEGATIVE
NITRITE UA: NEGATIVE
Protein, UA: NEGATIVE
Specific Gravity, UA: 1.01 (ref 1.005–1.030)
UUROB: 1 mg/dL (ref 0.2–1.0)
pH, UA: 5.5 (ref 5.0–7.5)

## 2016-08-22 LAB — MICROSCOPIC EXAMINATION: Bacteria, UA: NONE SEEN

## 2016-08-22 MED ORDER — CIPROFLOXACIN HCL 500 MG PO TABS
500.0000 mg | ORAL_TABLET | Freq: Once | ORAL | Status: AC
  Administered 2016-08-22: 500 mg via ORAL

## 2016-08-22 MED ORDER — LIDOCAINE HCL 2 % EX GEL
1.0000 "application " | Freq: Once | CUTANEOUS | Status: AC
  Administered 2016-08-22: 1 via URETHRAL

## 2016-08-22 NOTE — Progress Notes (Signed)
08/22/2016 4:24 PM   Keith Gibbs 03-15-1961 [id number]  Referring provider: Ronnell Freshwater, NP [place], [place] [address]  Chief Complaint  Patient presents with  . Cysto    HPI: 33M-year-old male who presents today for further evaluation of MH, PCa screening and right scrotal pain.  He reports a long history of intermittent bilateral testicular swelling which comes and goes. This is been going on for many years. He denies any swelling today or recently. His primary complaint includes a pulling sensation in his right testicle which is chronic.  He reports that this occurs primarily when he has an erection. The pain can be severe. History of inguinal hernias. Per his records, he does have a history of cirrhosis but he reports that this has improved/resolved with treatment of his hep C. No history of ascites.His Dec 2017 exam showed R>L testicle and a scrotal u/s was ordered. Scrotal u/s was normal.   He has BPH on exam with L>R base on prostate exam Dec 2017, but no specific hard areas or nodules. PSA was 0.9. He does have multiple urinary complaints including urinary urgency, intermittency, difficulty starting his stream. He also has pain in his lower abdomen/pelvis occasionally when voiding. He denies any overt dysuria. No penile discharge. He denies a history of urinary tract infections or gross hematuria.  His UA showed 3-10 rbc's. UA today shows 3-10 rbc's. He is a current smoker. He needs a CT.   Past medical history significant for CAD s/p recent PCI, COPD, history of hep C status post treatment, cocaine abuse, schizophrenia and tobacco abuse.   PMH: Past Medical History:  Diagnosis Date  . Anxiety   . Arthritis   . Bursitis   . COPD (chronic obstructive pulmonary disease) ([facility])   . Depression   . Hepatitis C 2012   No longer has Hep C    Surgical History: Past Surgical History:  Procedure Laterality Date  . ABDOMINAL SURGERY     Intestines  .  APPENDECTOMY    . BACK SURGERY    . CARDIAC CATHETERIZATION Left 06/10/2016   Procedure: Left Heart Cath and Coronary Angiography;  Surgeon: Dionisio David, MD;  Location: [facility];  Service: Cardiovascular;  Laterality: Left;  . CARDIAC CATHETERIZATION N/A 06/10/2016   Procedure: Coronary Stent Intervention;  Surgeon: Yolonda Kida, MD;  Location: [facility];  Service: Cardiovascular;  Laterality: N/A;  . SHOULDER SURGERY  04/09/2012    Home Medications:  Allergies as of 08/22/2016      Reactions   Latuda [lurasidone Hcl]    tremor   Naproxen Hives, Swelling   Saphris [asenapine]    Increased tremors      Medication List       Accurate as of 08/22/16  4:24 PM. Always use your most recent med list.          acetaminophen 500 MG tablet Commonly known as:  TYLENOL Take 1,000 mg by mouth every 6 (six) hours as needed. For pain.   ADVAIR DISKUS 250-50 MCG/DOSE Aepb Generic drug:  Fluticasone-Salmeterol Inhale 1 puff into the lungs 2 (two) times daily.   ARIPiprazole 5 MG tablet Commonly known as:  ABILIFY Take 5 mg by mouth at bedtime.   atorvastatin 40 MG tablet Commonly known as:  LIPITOR Take 1 tablet (40 mg total) by mouth daily at 6 PM.   busPIRone 10 MG tablet Commonly known as:  BUSPAR Take 10 mg by mouth 2 (  two) times daily.   doxepin 25 MG capsule Commonly known as:  SINEQUAN Take 25 mg by mouth at bedtime.   HYDROcodone-acetaminophen 5-325 MG tablet Commonly known as:  NORCO Take 1 tablet by mouth 2 (two) times daily as needed.   hydrOXYzine 50 MG tablet Commonly known as:  ATARAX/VISTARIL Take 50 mg by mouth 2 (two) times daily as needed for itching.   lamoTRIgine 100 MG tablet Commonly known as:  LAMICTAL Take 200 mg by mouth at bedtime.   nabumetone 750 MG tablet Commonly known as:  RELAFEN Take 1 tablet (750 mg total) by mouth 2 (two) times daily.   nitroGLYCERIN 0.4 MG SL tablet Commonly known as:  NITROSTAT Place  1 tablet (0.4 mg total) under the tongue every 5 (five) minutes x 3 doses as needed for chest pain.   omeprazole 20 MG capsule Commonly known as:  PRILOSEC Take 20 mg by mouth daily.   PROAIR HFA 108 (90 Base) MCG/ACT inhaler Generic drug:  albuterol Inhale 2 puffs into the lungs every 6 (six) hours as needed for wheezing or shortness of breath.   risperiDONE 0.5 MG tablet Commonly known as:  RISPERDAL Take 0.5 mg by mouth at bedtime.   rOPINIRole 0.5 MG tablet Commonly known as:  REQUIP Take 1-2 tablets by mouth 2 (two) times daily as needed.   tamsulosin 0.4 MG Caps capsule Commonly known as:  FLOMAX Take 1 capsule (0.4 mg total) by mouth daily.   ticagrelor 90 MG Tabs tablet Commonly known as:  BRILINTA Take 1 tablet (90 mg total) by mouth 2 (two) times daily.       Allergies:  Allergies  Allergen Reactions  . Latuda [Lurasidone Hcl]     tremor  . Naproxen Hives and Swelling  . Saphris [Asenapine]     Increased tremors    Family History: Family History  Problem Relation Age of Onset  . Osteoarthritis Mother   . Heart disease Mother   . Hypertension Mother   . Heart failure Father   . Heart disease Father   . Early death Father   . Hypertension Father   . Hypertension Sister   . Prostate cancer Neg Hx   . Bladder Cancer Neg Hx   . Kidney cancer Neg Hx     Social History:  reports that he has been smoking Cigarettes.  He has been smoking about 0.50 packs per day. He has never used smokeless tobacco. He reports that he does not drink alcohol or use drugs.  ROS:                                        Physical Exam: BP 136/81   Pulse 64   Ht 6\' 3"  (1.905 m)   Wt 89.9 kg (198 lb 4.8 oz)   BMI 24.79 kg/m   Constitutional:  Alert and oriented, No acute distress. HEENT: [place] AT, moist mucus membranes.  Trachea midline, no masses. Cardiovascular: No clubbing, cyanosis, or edema. Respiratory: Normal respiratory effort, no increased  work of breathing. GI: Abdomen is soft, nontender, nondistended, no abdominal masses Skin: No rashes, bruises or suspicious lesions. Lymph: No cervical or inguinal adenopathy. Neurologic: Grossly intact, no focal deficits, moving all 4 extremities. Psychiatric: Normal mood and affect. GU: Normal phallus with bilateral descended testicles, testicle palpably normal. Meatus normal.    Cystoscopy Procedure Note  Patient identification was confirmed, informed consent  was obtained, and patient was prepped using Betadine solution.  Lidocaine jelly was administered per urethral meatus.    Preoperative abx where received prior to procedure.     Pre-Procedure: - Inspection reveals a normal caliber ureteral meatus.  Procedure: The flexible cystoscope was introduced without difficulty - No urethral strictures/lesions are present. - short, non-obstructive  - normal bladder neck  - Bilateral ureteral orifices identified - Bladder mucosa  reveals no ulcers, tumors, or lesions - No bladder stones - No trabeculation  Retroflexion shows normal bladder neck.    Post-Procedure: - Patient tolerated the procedure well  Assessment/ Plan:  Laboratory Data: Lab Results  Component Value Date   WBC 5.9 06/10/2016   HGB 15.4 06/10/2016   HCT 43.3 06/10/2016   MCV 93.4 06/10/2016   PLT 174 06/10/2016    Lab Results  Component Value Date   CREATININE 0.87 06/10/2016    No results found for: PSA  No results found for: TESTOSTERONE  Lab Results  Component Value Date   HGBA1C 5.1 11/30/2012    Urinalysis    Component Value Date/Time   COLORURINE YELLOW (A) 09/24/2015 0724   APPEARANCEUR Hazy (A) 07/06/2016 1457   LABSPEC 1.006 09/24/2015 0724   PHURINE 7.0 09/24/2015 0724   GLUCOSEU Negative 07/06/2016 1457   HGBUR 2+ (A) 09/24/2015 0724   BILIRUBINUR Negative 07/06/2016 1457   KETONESUR NEGATIVE 09/24/2015 0724   PROTEINUR Negative 07/06/2016 1457   PROTEINUR NEGATIVE  09/24/2015 0724   NITRITE Negative 07/06/2016 1457   NITRITE NEGATIVE 09/24/2015 0724   LEUKOCYTESUR Negative 07/06/2016 1457    Pertinent Imaging: scrotal u/s  Assessment & Plan:    1. Microscopic hematuria - benign cysto. Discussed importance of CT and CT hematuria will be arranged. F/u to review.   2. BPH - discussed PSA normal.  3. Scrotal / testicle pain - normal exam, normal scrotal u/s. Discussed with patient.   - Urinalysis, Complete - ciprofloxacin (CIPRO) tablet 500 mg; Take 1 tablet (500 mg total) by mouth once. - lidocaine (XYLOCAINE) 2 % jelly 1 application; Place 1 application into the urethra once.    No Follow-up on file.  Festus Aloe, [facility] [address] [place], [place] [address] [phone]

## 2016-09-01 ENCOUNTER — Ambulatory Visit
Admission: RE | Admit: 2016-09-01 | Discharge: 2016-09-01 | Disposition: A | Discharge status: Home / Self Care | Payer: Medicaid Other | Source: Ambulatory Visit | Attending: Urology | Admitting: Urology

## 2016-09-01 DIAGNOSIS — N401 Enlarged prostate with lower urinary tract symptoms: Secondary | ICD-10-CM | POA: Insufficient documentation

## 2016-09-01 DIAGNOSIS — R161 Splenomegaly, not elsewhere classified: Secondary | ICD-10-CM | POA: Insufficient documentation

## 2016-09-01 DIAGNOSIS — I700 Atherosclerosis of aorta: Secondary | ICD-10-CM | POA: Diagnosis not present

## 2016-09-01 DIAGNOSIS — R3129 Other microscopic hematuria: Secondary | ICD-10-CM

## 2016-09-01 DIAGNOSIS — K829 Disease of gallbladder, unspecified: Secondary | ICD-10-CM | POA: Insufficient documentation

## 2016-09-01 DIAGNOSIS — Z9889 Other specified postprocedural states: Secondary | ICD-10-CM | POA: Diagnosis not present

## 2016-09-01 DIAGNOSIS — K769 Liver disease, unspecified: Secondary | ICD-10-CM | POA: Insufficient documentation

## 2016-09-01 LAB — POCT I-STAT CREATININE: Creatinine, Ser: 1 mg/dL (ref 0.61–1.24)

## 2016-09-01 MED ORDER — IOPAMIDOL (ISOVUE-300) INJECTION 61%
125.0000 mL | Freq: Once | INTRAVENOUS | Status: AC | PRN
  Administered 2016-09-01: 125 mL via INTRAVENOUS

## 2016-09-06 ENCOUNTER — Encounter: Payer: Self-pay | Admitting: Urology

## 2016-09-06 ENCOUNTER — Ambulatory Visit (INDEPENDENT_AMBULATORY_CARE_PROVIDER_SITE_OTHER): Payer: Medicaid Other | Admitting: Urology

## 2016-09-06 VITALS — BP 142/71 | HR 61 | Ht 75.0 in | Wt 197.5 lb

## 2016-09-06 DIAGNOSIS — R3129 Other microscopic hematuria: Secondary | ICD-10-CM | POA: Diagnosis not present

## 2016-09-06 DIAGNOSIS — N401 Enlarged prostate with lower urinary tract symptoms: Secondary | ICD-10-CM

## 2016-09-06 DIAGNOSIS — R161 Splenomegaly, not elsewhere classified: Secondary | ICD-10-CM | POA: Diagnosis not present

## 2016-09-06 DIAGNOSIS — N138 Other obstructive and reflux uropathy: Secondary | ICD-10-CM

## 2016-09-06 DIAGNOSIS — K7689 Other specified diseases of liver: Secondary | ICD-10-CM

## 2016-09-06 NOTE — Progress Notes (Signed)
09/06/2016 2:10 PM   Keith Gibbs Jun 17, 1961 [id number]  Referring provider: Ronnell Freshwater, NP [place], [place] [address]  No chief complaint on file.   HPI: F/u for several issues:  1. Testicular pain - asymmetry of the right testicle, larger than the left but no discrete masses on 06/2016 exam. F/u Jan 2018 scrotal u/s was normal.   2. BPH with obstruction/lower urinary tract symptoms Recommend trial of Flomax for urinary symptoms Dec 2017. Feb 2018 CT showed 81 gram prostate.   3. Microscopic hematuria - Incidental microscopic hematuria Dec 2017. His 08/22/2016 cysto was normal. CT Hematuria was benign Sep 01, 2016. It did show borderline splenomegaly and a liver lesion. Rad recommended a Liver MRI. If MH persists will consider cystoscopy.   4. Prostate asymmetry - asymmetry on prostate exam Dec 2017 with left base greater than right. PSA was 0.9.   Today, patient is seen for the above to review CT scan. It showed splenomegaly and liver lesion (probably a cyst). He reports he got sick last week and threw up blood. He does report a recent EGD/c-scope.   PMH: Past Medical History:  Diagnosis Date  . Anxiety   . Arthritis   . Bursitis   . COPD (chronic obstructive pulmonary disease) ([facility])   . Depression   . Hepatitis C 2012   No longer has Hep C    Surgical History: Past Surgical History:  Procedure Laterality Date  . ABDOMINAL SURGERY     Intestines  . APPENDECTOMY    . BACK SURGERY    . CARDIAC CATHETERIZATION Left 06/10/2016   Procedure: Left Heart Cath and Coronary Angiography;  Surgeon: Dionisio David, MD;  Location: [facility];  Service: Cardiovascular;  Laterality: Left;  . CARDIAC CATHETERIZATION N/A 06/10/2016   Procedure: Coronary Stent Intervention;  Surgeon: Yolonda Kida, MD;  Location: [facility];  Service: Cardiovascular;  Laterality: N/A;  . SHOULDER SURGERY  04/09/2012    Home Medications:  Allergies as  of 09/06/2016      Reactions   Latuda [lurasidone Hcl]    tremor   Naproxen Hives, Swelling   Saphris [asenapine]    Increased tremors      Medication List       Accurate as of 09/06/16  2:10 PM. Always use your most recent med list.          acetaminophen 500 MG tablet Commonly known as:  TYLENOL Take 1,000 mg by mouth every 6 (six) hours as needed. For pain.   ADVAIR DISKUS 250-50 MCG/DOSE Aepb Generic drug:  Fluticasone-Salmeterol Inhale 1 puff into the lungs 2 (two) times daily.   ARIPiprazole 5 MG tablet Commonly known as:  ABILIFY Take 5 mg by mouth at bedtime.   atorvastatin 40 MG tablet Commonly known as:  LIPITOR Take 1 tablet (40 mg total) by mouth daily at 6 PM.   busPIRone 10 MG tablet Commonly known as:  BUSPAR Take 10 mg by mouth 2 (two) times daily.   doxepin 25 MG capsule Commonly known as:  SINEQUAN Take 25 mg by mouth at bedtime.   HYDROcodone-acetaminophen 5-325 MG tablet Commonly known as:  NORCO Take 1 tablet by mouth 2 (two) times daily as needed.   hydrOXYzine 50 MG tablet Commonly known as:  ATARAX/VISTARIL Take 50 mg by mouth 2 (two) times daily as needed for itching.   lamoTRIgine 100 MG tablet Commonly known as:  LAMICTAL Take 200 mg by mouth  at bedtime.   nabumetone 750 MG tablet Commonly known as:  RELAFEN Take 1 tablet (750 mg total) by mouth 2 (two) times daily.   nitroGLYCERIN 0.4 MG SL tablet Commonly known as:  NITROSTAT Place 1 tablet (0.4 mg total) under the tongue every 5 (five) minutes x 3 doses as needed for chest pain.   omeprazole 20 MG capsule Commonly known as:  PRILOSEC Take 20 mg by mouth daily.   PROAIR HFA 108 (90 Base) MCG/ACT inhaler Generic drug:  albuterol Inhale 2 puffs into the lungs every 6 (six) hours as needed for wheezing or shortness of breath.   risperiDONE 0.5 MG tablet Commonly known as:  RISPERDAL Take 0.5 mg by mouth at bedtime.   rOPINIRole 0.5 MG tablet Commonly known as:   REQUIP Take 1-2 tablets by mouth 2 (two) times daily as needed.   tamsulosin 0.4 MG Caps capsule Commonly known as:  FLOMAX Take 1 capsule (0.4 mg total) by mouth daily.   ticagrelor 90 MG Tabs tablet Commonly known as:  BRILINTA Take 1 tablet (90 mg total) by mouth 2 (two) times daily.       Allergies:  Allergies  Allergen Reactions  . Latuda [Lurasidone Hcl]     tremor  . Naproxen Hives and Swelling  . Saphris [Asenapine]     Increased tremors    Family History: Family History  Problem Relation Age of Onset  . Osteoarthritis Mother   . Heart disease Mother   . Hypertension Mother   . Heart failure Father   . Heart disease Father   . Early death Father   . Hypertension Father   . Hypertension Sister   . Prostate cancer Neg Hx   . Bladder Cancer Neg Hx   . Kidney cancer Neg Hx     Social History:  reports that he has been smoking Cigarettes.  He has been smoking about 0.50 packs per day. He has never used smokeless tobacco. He reports that he does not drink alcohol or use drugs.  ROS:                                        Physical Exam: There were no vitals taken for this visit.  Constitutional:  Alert and oriented, No acute distress. HEENT: [place] AT, moist mucus membranes.  Trachea midline, no masses. Cardiovascular: No clubbing, cyanosis, or edema. Respiratory: Normal respiratory effort, no increased work of breathing. GI: Abdomen is soft, nontender, nondistended, no abdominal masses GU: No CVA tenderness. Skin: No rashes, bruises or suspicious lesions. Lymph: No cervical or inguinal adenopathy. Neurologic: Grossly intact, no focal deficits, moving all 4 extremities. Psychiatric: Normal mood and affect.  Laboratory Data: Lab Results  Component Value Date   WBC 5.9 06/10/2016   HGB 15.4 06/10/2016   HCT 43.3 06/10/2016   MCV 93.4 06/10/2016   PLT 174 06/10/2016    Lab Results  Component Value Date   CREATININE 1.00  09/01/2016    No results found for: PSA  No results found for: TESTOSTERONE  Lab Results  Component Value Date   HGBA1C 5.1 11/30/2012    Urinalysis    Component Value Date/Time   COLORURINE YELLOW (A) 09/24/2015 0724   APPEARANCEUR Clear 08/22/2016 1554   LABSPEC 1.006 09/24/2015 0724   PHURINE 7.0 09/24/2015 0724   GLUCOSEU Negative 08/22/2016 1554   HGBUR 2+ (A) 09/24/2015 [id number]  BILIRUBINUR Negative 08/22/2016 [place] 09/24/2015 0724   PROTEINUR Negative 08/22/2016 [place] 09/24/2015 0724   NITRITE Negative 08/22/2016 1554   NITRITE NEGATIVE 09/24/2015 0724   LEUKOCYTESUR Negative 08/22/2016 1554    Pertinent Imaging: CT scan  Assessment & Plan:    1) microhematuria  -  Benign evaluation.   2) BPH - continue tamsulosin. -- we'll see him back in 6 months for PVR.   3) splenomagaly - refer to Gen Surg to review   4) Liver lesion - h/o hep c - check MRI. Also, instructed to let his PCP know about the bloody emesis.   There are no diagnoses linked to this encounter.  No Follow-up on file.  Festus Aloe, [facility] [address], [place], [place] [address] [phone]

## 2016-09-07 ENCOUNTER — Telehealth: Payer: Self-pay | Admitting: General Surgery

## 2016-09-07 NOTE — Telephone Encounter (Signed)
09-07-16 L/M @ H# FOR PT TO CALL BACK & SCHEDULE AN APPOINTMENT WITH DR Baldo Daub ON CALL WHEN REF PLACED)ABN CT SCAN DONE 09-01-16 @ [place] REF DR MATTHEW ESKRIDGE([facility]).CELL # WAS OUT OF SERVICE/MTH

## 2016-09-13 ENCOUNTER — Encounter: Payer: Self-pay | Admitting: *Deleted

## 2016-09-14 ENCOUNTER — Encounter: Payer: Self-pay | Admitting: General Surgery

## 2016-09-14 ENCOUNTER — Ambulatory Visit (INDEPENDENT_AMBULATORY_CARE_PROVIDER_SITE_OTHER): Payer: Medicaid Other | Admitting: General Surgery

## 2016-09-14 VITALS — BP 126/70 | HR 76 | Resp 18 | Ht 75.0 in | Wt 194.0 lb

## 2016-09-14 DIAGNOSIS — R935 Abnormal findings on diagnostic imaging of other abdominal regions, including retroperitoneum: Secondary | ICD-10-CM | POA: Diagnosis not present

## 2016-09-14 NOTE — Patient Instructions (Signed)
The patient is aware to call back for any questions or concerns.  

## 2016-09-14 NOTE — Progress Notes (Signed)
Patient ID: Keith Gibbs, male   DOB: 1961/05/29, 57 y.o.   MRN: [id number]  Chief Complaint  Patient presents with  . Other    abnormal CT    HPI Keith Gibbs is a 56 y.o. male.  Here today for evaluation of an abnormal CT scan done on 09-01-16 for hematuria followed by Dr Caryl Pina and Dr Junious Silk. He has enlarged prostate, spot on liver and enlarged spleen. Right testicle is swollen, lower abdominal pain with BM and voiding.  He is here with his girlfriend, Camera operator. I have reviewed the history of present illness with the patient. HPI  Past Medical History:  Diagnosis Date  . Anxiety   . Arthritis   . Bursitis   . COPD (chronic obstructive pulmonary disease) ([place])   . Depression   . Hepatitis C 2012   No longer has Hep C    Past Surgical History:  Procedure Laterality Date  . ABDOMINAL SURGERY     Intestines  . APPENDECTOMY    . BACK SURGERY    . CARDIAC CATHETERIZATION Left 06/10/2016   Procedure: Left Heart Cath and Coronary Angiography;  Surgeon: Dionisio David, MD;  Location: [facility];  Service: Cardiovascular;  Laterality: Left;  . CARDIAC CATHETERIZATION N/A 06/10/2016   Procedure: Coronary Stent Intervention;  Surgeon: Yolonda Kida, MD;  Location: [facility];  Service: Cardiovascular;  Laterality: N/A;  . COLONOSCOPY    . SHOULDER SURGERY  04/09/2012    Family History  Problem Relation Age of Onset  . Osteoarthritis Mother   . Heart disease Mother   . Hypertension Mother   . Heart failure Father   . Heart disease Father   . Early death Father   . Hypertension Father   . Hypertension Sister   . Prostate cancer Neg Hx   . Bladder Cancer Neg Hx   . Kidney cancer Neg Hx     Social History Social History  Substance Use Topics  . Smoking status: Current Every Day Smoker    Packs/day: 0.50    Types: Cigarettes    Last attempt to quit: 03/04/2012  . Smokeless tobacco: Never Used  . Alcohol use No    Allergies   Allergen Reactions  . Latuda [Lurasidone Hcl]     tremor  . Naproxen Hives and Swelling  . Saphris [Asenapine]     Increased tremors    Current Outpatient Prescriptions  Medication Sig Dispense Refill  . acetaminophen (TYLENOL) 500 MG tablet Take 1,000 mg by mouth every 6 (six) hours as needed. For pain.    Marland Kitchen albuterol (PROAIR HFA) 108 (90 BASE) MCG/ACT inhaler Inhale 2 puffs into the lungs every 6 (six) hours as needed for wheezing or shortness of breath.     Marland Kitchen aspirin EC 81 MG tablet Take 81 mg by mouth daily.    Marland Kitchen atorvastatin (LIPITOR) 40 MG tablet Take 1 tablet (40 mg total) by mouth daily at 6 PM. 30 tablet 0  . busPIRone (BUSPAR) 10 MG tablet Take 10 mg by mouth 2 (two) times daily.    . cyclobenzaprine (FLEXERIL) 10 MG tablet Take 10 mg by mouth at bedtime.    . Fluticasone-Salmeterol (ADVAIR DISKUS) 250-50 MCG/DOSE AEPB Inhale 1 puff into the lungs 2 (two) times daily.    . nitroGLYCERIN (NITROSTAT) 0.4 MG SL tablet Place 1 tablet (0.4 mg total) under the tongue every 5 (five) minutes x 3 doses as needed for chest pain. 30 tablet 0  .  omeprazole (PRILOSEC) 20 MG capsule Take 20 mg by mouth daily.    Marland Kitchen rOPINIRole (REQUIP) 0.5 MG tablet Take 1-2 tablets by mouth 2 (two) times daily as needed.  1  . tamsulosin (FLOMAX) 0.4 MG CAPS capsule Take 1 capsule (0.4 mg total) by mouth daily. 30 capsule 11  . ticagrelor (BRILINTA) 90 MG TABS tablet Take 1 tablet (90 mg total) by mouth 2 (two) times daily. 60 tablet 0  . traMADol (ULTRAM) 50 MG tablet Take 50 mg by mouth daily.     No current facility-administered medications for this visit.     Review of Systems Review of Systems  Constitutional: Negative.   Respiratory: Positive for cough.   Cardiovascular: Negative.     Blood pressure 126/70, pulse 76, resp. rate 18, height 6\' 3"  (1.905 m), weight 194 lb (88 kg).  Physical Exam Physical Exam  Constitutional: He is oriented to person, place, and time. He appears well-developed  and well-nourished.  HENT:  Mouth/Throat: Oropharynx is clear and moist.  Eyes: Conjunctivae are normal. No scleral icterus.  Neck: Neck supple.  Cardiovascular: Normal rate, regular rhythm and normal heart sounds.   Pulmonary/Chest: Effort normal. He has wheezes.  Abdominal: Soft. Normal appearance. There is no tenderness. A hernia is present.  1 finger breath facial defect umbilical location   Lymphadenopathy:    He has no cervical adenopathy.  Neurological: He is alert and oriented to person, place, and time.  Skin: Skin is warm and dry.  Psychiatric: His behavior is normal.    Data Reviewed CT reviewed- spleen is upper limit of normal size and this is of questionable significance. Two small hypodense areas are seen on the liver for which MRI has been scheduled.  Progress notes, CT scan and labs  Assessment    Liver lesions which likely are benign but warrants MRI to confirm. No findings of concern otherwise.     Plan    Follow up pending MRI next week.  No further workup is indicated if the liver lesions turn out to be benign cysts.   Small umbilical hernia which is asymptomatic.      This information has been scribed by Keith Fetch RN, BSN,BC.  Keith Gibbs G 09/14/2016, 4:11 PM

## 2016-09-21 ENCOUNTER — Ambulatory Visit
Admission: RE | Admit: 2016-09-21 | Discharge: 2016-09-21 | Disposition: A | Discharge status: Home / Self Care | Payer: Medicaid Other | Source: Ambulatory Visit | Attending: Urology | Admitting: Urology

## 2016-09-21 DIAGNOSIS — K7689 Other specified diseases of liver: Secondary | ICD-10-CM | POA: Insufficient documentation

## 2016-09-21 LAB — POCT I-STAT CREATININE: CREATININE: 1 mg/dL (ref 0.61–1.24)

## 2016-09-21 MED ORDER — GADOBENATE DIMEGLUMINE 529 MG/ML IV SOLN
18.0000 mL | Freq: Once | INTRAVENOUS | Status: AC | PRN
  Administered 2016-09-21: 18 mL via INTRAVENOUS

## 2016-09-22 ENCOUNTER — Telehealth: Payer: Self-pay

## 2016-09-22 NOTE — Telephone Encounter (Signed)
Keith Aloe, MD  Lestine Box, LPN        Notify patient the area in his liver of concern on the CT was just a cyst on the MRI. Nothing to worry about. However, the radiologist thought there was evidence of cirrhosis (scarring of the liver), the pt needs to follow-up with his GI Dr. to review the scans.    LMOM

## 2016-09-23 NOTE — Telephone Encounter (Signed)
Spoke with a lady who states pt has gone missing. Threasa Beards stated that she has pt cell phone and is at his house and he has not returned since yesterday. Requested pt to return call when he comes home.

## 2016-09-25 ENCOUNTER — Emergency Department: Payer: Medicaid Other

## 2016-09-25 ENCOUNTER — Encounter: Payer: Self-pay | Admitting: Emergency Medicine

## 2016-09-25 ENCOUNTER — Emergency Department
Admission: EM | Admit: 2016-09-25 | Discharge: 2016-09-25 | Disposition: A | Discharge status: Home / Self Care | Payer: Medicaid Other | Attending: Emergency Medicine | Admitting: Emergency Medicine

## 2016-09-25 DIAGNOSIS — S66912A Strain of unspecified muscle, fascia and tendon at wrist and hand level, left hand, initial encounter: Secondary | ICD-10-CM | POA: Diagnosis not present

## 2016-09-25 DIAGNOSIS — Y929 Unspecified place or not applicable: Secondary | ICD-10-CM | POA: Diagnosis not present

## 2016-09-25 DIAGNOSIS — X501XXA Overexertion from prolonged static or awkward postures, initial encounter: Secondary | ICD-10-CM | POA: Insufficient documentation

## 2016-09-25 DIAGNOSIS — S6991XA Unspecified injury of right wrist, hand and finger(s), initial encounter: Secondary | ICD-10-CM | POA: Diagnosis present

## 2016-09-25 DIAGNOSIS — Y9389 Activity, other specified: Secondary | ICD-10-CM | POA: Insufficient documentation

## 2016-09-25 DIAGNOSIS — J449 Chronic obstructive pulmonary disease, unspecified: Secondary | ICD-10-CM | POA: Diagnosis not present

## 2016-09-25 DIAGNOSIS — Y999 Unspecified external cause status: Secondary | ICD-10-CM | POA: Diagnosis not present

## 2016-09-25 DIAGNOSIS — G5601 Carpal tunnel syndrome, right upper limb: Secondary | ICD-10-CM | POA: Diagnosis not present

## 2016-09-25 DIAGNOSIS — Z7982 Long term (current) use of aspirin: Secondary | ICD-10-CM | POA: Insufficient documentation

## 2016-09-25 DIAGNOSIS — S66911A Strain of unspecified muscle, fascia and tendon at wrist and hand level, right hand, initial encounter: Secondary | ICD-10-CM | POA: Insufficient documentation

## 2016-09-25 DIAGNOSIS — F17210 Nicotine dependence, cigarettes, uncomplicated: Secondary | ICD-10-CM | POA: Insufficient documentation

## 2016-09-25 MED ORDER — ONDANSETRON 4 MG PO TBDP
4.0000 mg | ORAL_TABLET | Freq: Once | ORAL | Status: AC
  Administered 2016-09-25: 4 mg via ORAL
  Filled 2016-09-25: qty 1

## 2016-09-25 MED ORDER — PREDNISONE 10 MG PO TABS: ORAL_TABLET | ORAL | 0 refills | Status: DC

## 2016-09-25 MED ORDER — OXYCODONE-ACETAMINOPHEN 5-325 MG PO TABS
ORAL_TABLET | ORAL | Status: AC
  Administered 2016-09-25: 2 via ORAL
  Filled 2016-09-25: qty 2

## 2016-09-25 MED ORDER — MORPHINE SULFATE (PF) 4 MG/ML IV SOLN
4.0000 mg | Freq: Once | INTRAVENOUS | Status: AC
  Administered 2016-09-25: 4 mg via INTRAMUSCULAR
  Filled 2016-09-25: qty 1

## 2016-09-25 MED ORDER — OXYCODONE-ACETAMINOPHEN 5-325 MG PO TABS
2.0000 | ORAL_TABLET | Freq: Once | ORAL | Status: AC
  Administered 2016-09-25: 2 via ORAL

## 2016-09-25 MED ORDER — PREDNISONE 20 MG PO TABS
60.0000 mg | ORAL_TABLET | Freq: Once | ORAL | Status: AC
  Administered 2016-09-25: 60 mg via ORAL
  Filled 2016-09-25: qty 3

## 2016-09-25 MED ORDER — OXYCODONE-ACETAMINOPHEN 5-325 MG PO TABS: 1.0000 | ORAL_TABLET | Freq: Four times a day (QID) | ORAL | 0 refills | Status: DC | PRN

## 2016-09-25 NOTE — Discharge Instructions (Signed)
You were evaluated for pain and swelling of the hands, especially the right hand after hard labor,  which we discussed your having symptoms of carpal tunnel syndrome. You're placed in a splint to help prevent inflammation of the nerve any further. You are started on prednisone to help reduce inflammation. Your prescribed pain medication to help with acute pain.   Return to the emergency department immediately for any worsening numbness or weakness, blue discoloration or cold fingertips, redness or skin rash, new or worsening pain or swelling.

## 2016-09-25 NOTE — ED Provider Notes (Signed)
[facility] Emergency Department Provider Note ____________________________________________   I have reviewed the triage vital signs and the triage nursing note.  HISTORY  Chief Complaint Hand Injury and Arm Pain   Historian Patient  HPI Keith Gibbs is a 56 y.o. male states that he was ripping up a deck yesterday and in the evening he started to develop right hand and wrist swelling and pain and throbbing. This morning the pain and swelling is worse and there is some purplish discoloration to his palm. He is also feeling some numbness and tingling to his thumb index and middle finger. He does not have a history of carpal tunnel syndrome. Left hand he is also having some discomfort over the dorsal surface with mild swelling.  He does have a tiny laceration to the left thenarwebspace area without any specific tenderness there.    Past Medical History:  Diagnosis Date  . Anxiety   . Arthritis   . Bursitis   . COPD (chronic obstructive pulmonary disease) ([place])   . Depression   . Hepatitis C 2012   No longer has Hep C    Patient Active Problem List   Diagnosis Date Noted  . Angina at rest [facility]) 06/11/2016  . Chest pain 06/10/2016  . Bipolar 1 disorder ([place]) 05/27/2013  . Myalgia and myositis 05/27/2013  . Abnormal ejaculation 11/30/2012  . Chest pain, unspecified 11/30/2012  . Degenerative arthritis of hip 11/16/2012  . Hip pain, left 08/29/2012  . Swollen testicle 07/27/2012  . Schizophrenia ([place]) 06/27/2012  . Osteoarthritis 06/27/2012  . Shoulder pain 06/27/2012  . COPD (chronic obstructive pulmonary disease) ([place]) 06/27/2012  . Chronic hepatitis C ([place]) 06/27/2012  . GERD (gastroesophageal reflux disease) 06/27/2012  . Complete rupture of rotator cuff 02/10/2012  . Cocaine abuse in remission 12/05/2011    Past Surgical History:  Procedure Laterality Date  . ABDOMINAL SURGERY     Intestines  . APPENDECTOMY    . BACK SURGERY    .  CARDIAC CATHETERIZATION Left 06/10/2016   Procedure: Left Heart Cath and Coronary Angiography;  Surgeon: Dionisio David, MD;  Location: [place] CV LAB;  Service: Cardiovascular;  Laterality: Left;  . CARDIAC CATHETERIZATION N/A 06/10/2016   Procedure: Coronary Stent Intervention;  Surgeon: Yolonda Kida, MD;  Location: [place] CV LAB;  Service: Cardiovascular;  Laterality: N/A;  . COLONOSCOPY    . SHOULDER SURGERY  04/09/2012    Prior to Admission medications   Medication Sig Start Date End Date Taking? Authorizing Provider  acetaminophen (TYLENOL) 500 MG tablet Take 1,000 mg by mouth every 6 (six) hours as needed. For pain.    Historical Provider, MD  albuterol (PROAIR HFA) 108 (90 BASE) MCG/ACT inhaler Inhale 2 puffs into the lungs every 6 (six) hours as needed for wheezing or shortness of breath.  05/28/12   Historical Provider, MD  aspirin EC 81 MG tablet Take 81 mg by mouth daily.    Historical Provider, MD  atorvastatin (LIPITOR) 40 MG tablet Take 1 tablet (40 mg total) by mouth daily at 6 PM. 06/11/16   Loletha Grayer, MD  busPIRone (BUSPAR) 10 MG tablet Take 10 mg by mouth 2 (two) times daily.    Historical Provider, MD  cyclobenzaprine (FLEXERIL) 10 MG tablet Take 10 mg by mouth at bedtime.    Historical Provider, MD  Fluticasone-Salmeterol (ADVAIR DISKUS) 250-50 MCG/DOSE AEPB Inhale 1 puff into the lungs 2 (two) times daily. 03/20/12   Historical Provider, MD  nitroGLYCERIN (  NITROSTAT) 0.4 MG SL tablet Place 1 tablet (0.4 mg total) under the tongue every 5 (five) minutes x 3 doses as needed for chest pain. 06/11/16   Loletha Grayer, MD  omeprazole (PRILOSEC) 20 MG capsule Take 20 mg by mouth daily.    Historical Provider, MD  oxyCODONE-acetaminophen (ROXICET) 5-325 MG tablet Take 1-2 tablets by mouth every 6 (six) hours as needed for severe pain. 09/25/16   Lisa Roca, MD  predniSONE (DELTASONE) 10 MG tablet 50mg  daily for 4 more days 09/25/16   Lisa Roca, MD   rOPINIRole (REQUIP) 0.5 MG tablet Take 1-2 tablets by mouth 2 (two) times daily as needed. 08/05/15   Historical Provider, MD  tamsulosin (FLOMAX) 0.4 MG CAPS capsule Take 1 capsule (0.4 mg total) by mouth daily. 07/06/16   Hollice Espy, MD  ticagrelor (BRILINTA) 90 MG TABS tablet Take 1 tablet (90 mg total) by mouth 2 (two) times daily. 06/11/16   Loletha Grayer, MD  traMADol (ULTRAM) 50 MG tablet Take 50 mg by mouth daily.    Historical Provider, MD    Allergies  Allergen Reactions  . Latuda [Lurasidone Hcl]     tremor  . Naproxen Hives and Swelling  . Saphris [Asenapine]     Increased tremors    Family History  Problem Relation Age of Onset  . Osteoarthritis Mother   . Heart disease Mother   . Hypertension Mother   . Heart failure Father   . Heart disease Father   . Early death Father   . Hypertension Father   . Hypertension Sister   . Prostate cancer Neg Hx   . Bladder Cancer Neg Hx   . Kidney cancer Neg Hx     Social History Social History  Substance Use Topics  . Smoking status: Current Every Day Smoker    Packs/day: 0.50    Types: Cigarettes    Last attempt to quit: 03/04/2012  . Smokeless tobacco: Never Used  . Alcohol use No    Review of Systems  Constitutional: Negative for fever. Eyes: Negative for visual changes. ENT: Negative for sore throat. Cardiovascular: Negative for chest pain. Respiratory: Negative for shortness of breath. Gastrointestinal: Negative for abdominal pain, vomiting and diarrhea. Genitourinary: Negative for dysuria. Musculoskeletal: left hand and right hand and wrist pain. Skin: Negative for rash. Neurological: Negative for headache. 10 point Review of Systems otherwise negative ____________________________________________   PHYSICAL EXAM:  VITAL SIGNS: ED Triage Vitals  Enc Vitals Group     BP 09/25/16 1124 131/73     Pulse Rate 09/25/16 1124 61     Resp 09/25/16 1124 18     Temp 09/25/16 1124 98.1 F (36.7 C)      Temp Source 09/25/16 1124 Oral     SpO2 09/25/16 1124 98 %     Weight 09/25/16 1125 197 lb (89.4 kg)     Height 09/25/16 1125 6\' 3"  (1.905 m)     Head Circumference --      Peak Flow --      Pain Score 09/25/16 1125 10     Pain Loc --      Pain Edu? --      Excl. in [place]? --      Constitutional: Alert and oriented. Well appearing and in no distress. HEENT   Head: Normocephalic and atraumatic.      Eyes: Conjunctivae are normal. PERRL. Normal extraocular movements.      Ears:         Nose: No  congestion/rhinnorhea.   Mouth/Throat: Mucous membranes are moist.   Neck: No stridor. Cardiovascular/Chest: Normal rate, regular rhythm.  No murmurs, rubs, or gallops.  Radial pulse intact. Respiratory: Normal respiratory effort without tachypnea nor retractions. Breath sounds are clear and equal bilaterally. No wheezes/rales/rhonchi. Gastrointestinal: Soft. No distention, no guarding, no rebound. Nontender.    Genitourinary/rectal:Deferred Musculoskeletal: left hand dorsal surface mild swelling and tenderness over the DIP joint especially of the third finger without any significant redness. Right hand significantly swollen with some erythema without blistering over the palm surface. The swelling midway up the right forearm but with soft compartments.  Neurologic:  Normal speech and language. Paresthesia right first second and third finger. Skin:  No skin streaking, but there is redness across the palm of the right hand. Psychiatric: Mood and affect are normal. Speech and behavior are normal. Patient exhibits appropriate insight and judgment.   ____________________________________________  LABS (pertinent positives/negatives)  Labs Reviewed - No data to display  ____________________________________________    EKG I, Lisa Roca, MD, the attending physician have personally viewed and interpreted all ECGs.  None ____________________________________________  RADIOLOGY All  Xrays were viewed by me. Imaging interpreted by Radiologist.  xRays Left hand: Negative   Right wrist and right hand:Negative __________________________________________  PROCEDURES  Procedure(s) performed: None  Critical Care performed: None  ____________________________________________   ED COURSE / ASSESSMENT AND PLAN  Pertinent labs & imaging results that were available during my care of the patient were reviewed by me and considered in my medical decision making (see chart for details).   Clinically has a fair amount of swelling, uncertain if this is just from overuse or underlying bony injury. He is having symptoms of carpal tunnel on the right hand. Clinically not consistent with compartment syndrome at this point in time.  Paresthesias along the distribution of the median nerve consistent with carpal tunnel syndrome.   He reports facial swelling due to NSAID, and given the amount of swelling and going to go ahead and treat him with a burst of prednisone. He has tolerated prednisone in the past for his lungs. Placed his right wrist in a velcro cock up splint.  I am going to prescribe Percocet as needed for moderate severe pain, hopefully over the next few days as the inflammation goes down.     CONSULTATIONS:   None   Patient / Family / Caregiver informed of clinical course, medical decision-making process, and agree with plan.   I discussed return precautions, follow-up instructions, and discharge instructions with patient and/or family.  Discharge instructions:  You were evaluated for pain and swelling of the hands, especially the right hand after hard labor,  which we discussed your having symptoms of carpal tunnel syndrome. You're placed in a splint to help prevent inflammation of the nerve any further. You are started on prednisone to help reduce inflammation. Your prescribed pain medication to help with acute pain.   Return to the emergency department immediately  for any worsening numbness or weakness, blue discoloration or cold fingertips, redness or skin rash, new or worsening pain or swelling. ___________________________________________   FINAL CLINICAL IMPRESSION(S) / ED DIAGNOSES   Final diagnoses:  Carpal tunnel syndrome of right wrist  Hand strain, left, initial encounter  Hand strain, right, initial encounter              Note: This dictation was prepared with Dragon dictation. Any transcriptional errors that result from this process are unintentional    Lisa Roca, MD 09/25/16 1321

## 2016-09-25 NOTE — ED Triage Notes (Signed)
Pt states he was working on tearing up a deck and noticed swelling to right wrist and hand as well as left hand unsure if he injured it.

## 2016-09-26 ENCOUNTER — Telehealth: Payer: Self-pay

## 2016-09-26 NOTE — Telephone Encounter (Signed)
Certified letter sent. Tracking #[id number]

## 2016-09-26 NOTE — Telephone Encounter (Signed)
LMOM- will send a certified letter.

## 2016-09-28 ENCOUNTER — Telehealth: Payer: Self-pay | Admitting: *Deleted

## 2016-09-28 NOTE — Telephone Encounter (Signed)
Patient contacted today and he was made aware that Dr. Jamal Collin did review his MRI. This patient does not need to see the GI department. He was encouraged to follow up with his primary care in regards to this. No further follow up is needed with Dr. Jamal Collin.  This patient verbalizes understanding.

## 2016-09-28 NOTE — Telephone Encounter (Signed)
Patient called and stated that he needs to be seen at [facility] GI for a spot on his liver and that we need to do the referral. I did not see anything in the system regarding this.

## 2016-10-10 ENCOUNTER — Telehealth: Payer: Self-pay

## 2016-10-10 NOTE — Telephone Encounter (Signed)
Pt signed for certified letter on 6/41/58.

## 2016-10-19 ENCOUNTER — Emergency Department: Payer: Medicaid Other

## 2016-10-19 ENCOUNTER — Emergency Department
Admission: EM | Admit: 2016-10-19 | Discharge: 2016-10-19 | Disposition: A | Discharge status: Home / Self Care | Payer: Medicaid Other | Attending: Emergency Medicine | Admitting: Emergency Medicine

## 2016-10-19 ENCOUNTER — Encounter: Payer: Self-pay | Admitting: Emergency Medicine

## 2016-10-19 DIAGNOSIS — Z7982 Long term (current) use of aspirin: Secondary | ICD-10-CM | POA: Diagnosis not present

## 2016-10-19 DIAGNOSIS — Z79899 Other long term (current) drug therapy: Secondary | ICD-10-CM | POA: Diagnosis not present

## 2016-10-19 DIAGNOSIS — F17210 Nicotine dependence, cigarettes, uncomplicated: Secondary | ICD-10-CM | POA: Diagnosis not present

## 2016-10-19 DIAGNOSIS — J449 Chronic obstructive pulmonary disease, unspecified: Secondary | ICD-10-CM | POA: Insufficient documentation

## 2016-10-19 DIAGNOSIS — J069 Acute upper respiratory infection, unspecified: Secondary | ICD-10-CM | POA: Insufficient documentation

## 2016-10-19 DIAGNOSIS — R0981 Nasal congestion: Secondary | ICD-10-CM | POA: Diagnosis present

## 2016-10-19 MED ORDER — AZITHROMYCIN 250 MG PO TABS: ORAL_TABLET | ORAL | 0 refills | Status: DC

## 2016-10-19 MED ORDER — IPRATROPIUM-ALBUTEROL 0.5-2.5 (3) MG/3ML IN SOLN
3.0000 mL | Freq: Once | RESPIRATORY_TRACT | Status: AC
  Administered 2016-10-19: 3 mL via RESPIRATORY_TRACT
  Filled 2016-10-19: qty 3

## 2016-10-19 MED ORDER — PREDNISONE 10 MG (21) PO TBPK: ORAL_TABLET | ORAL | 0 refills | Status: DC

## 2016-10-19 NOTE — ED Triage Notes (Signed)
Pt to ED via POV for nasal congestion with cough and headache. Pt A&Ox4

## 2016-10-19 NOTE — ED Provider Notes (Signed)
[place] Emergency Department Provider Note  ____________________________________________  Time seen: Approximately 5:12 PM  I have reviewed the triage vital signs and the nursing notes.   HISTORY  Chief Complaint Nasal Congestion    HPI Keith Gibbs is a 56 y.o. male that presents to the emergency department with 3 days of headache, chills, muscle aches, congestion, productive cough, shortness of breath. Patient is coughing up yellow sputum. Patient states that he was sweating so bad in the middle of the night that he soaked through the sheets. He has not checked his temperature. He is using Flonase for congestion. Patient has COPD and uses 2 inhalers. Patient smokes a pack a day. Since he's been sick he is only been able to smoke 4-5 cigarettes a day. He is trying to quit. Patient states that he "has a lot of medical problems." He denies chest pain, nausea, vomiting, diarrhea, constipation.   Past Medical History:  Diagnosis Date  . Anxiety   . Arthritis   . Bursitis   . COPD (chronic obstructive pulmonary disease) ([place])   . Depression   . Hepatitis C 2012   No longer has Hep C    Patient Active Problem List   Diagnosis Date Noted  . Angina at rest [facility]) 06/11/2016  . Chest pain 06/10/2016  . Bipolar 1 disorder ([place]) 05/27/2013  . Myalgia and myositis 05/27/2013  . Abnormal ejaculation 11/30/2012  . Chest pain, unspecified 11/30/2012  . Degenerative arthritis of hip 11/16/2012  . Hip pain, left 08/29/2012  . Swollen testicle 07/27/2012  . Schizophrenia ([place]) 06/27/2012  . Osteoarthritis 06/27/2012  . Shoulder pain 06/27/2012  . COPD (chronic obstructive pulmonary disease) ([place]) 06/27/2012  . Chronic hepatitis C ([place]) 06/27/2012  . GERD (gastroesophageal reflux disease) 06/27/2012  . Complete rupture of rotator cuff 02/10/2012  . Cocaine abuse in remission 12/05/2011    Past Surgical History:  Procedure Laterality Date  . ABDOMINAL  SURGERY     Intestines  . APPENDECTOMY    . BACK SURGERY    . CARDIAC CATHETERIZATION Left 06/10/2016   Procedure: Left Heart Cath and Coronary Angiography;  Surgeon: Dionisio David, MD;  Location: [place] CV LAB;  Service: Cardiovascular;  Laterality: Left;  . CARDIAC CATHETERIZATION N/A 06/10/2016   Procedure: Coronary Stent Intervention;  Surgeon: Yolonda Kida, MD;  Location: [place] CV LAB;  Service: Cardiovascular;  Laterality: N/A;  . COLONOSCOPY    . SHOULDER SURGERY  04/09/2012    Prior to Admission medications   Medication Sig Start Date End Date Taking? Authorizing Provider  acetaminophen (TYLENOL) 500 MG tablet Take 1,000 mg by mouth every 6 (six) hours as needed. For pain.    Historical Provider, MD  albuterol (PROAIR HFA) 108 (90 BASE) MCG/ACT inhaler Inhale 2 puffs into the lungs every 6 (six) hours as needed for wheezing or shortness of breath.  05/28/12   Historical Provider, MD  aspirin EC 81 MG tablet Take 81 mg by mouth daily.    Historical Provider, MD  atorvastatin (LIPITOR) 40 MG tablet Take 1 tablet (40 mg total) by mouth daily at 6 PM. 06/11/16   Loletha Grayer, MD  azithromycin (ZITHROMAX Z-PAK) 250 MG tablet Take 2 tablets (500 mg) on  Day 1,  followed by 1 tablet (250 mg) once daily on Days 2 through 5. 10/19/16   Laban Emperor, PA-C  busPIRone (BUSPAR) 10 MG tablet Take 10 mg by mouth 2 (two) times daily.    Historical Provider,  MD  cyclobenzaprine (FLEXERIL) 10 MG tablet Take 10 mg by mouth at bedtime.    Historical Provider, MD  Fluticasone-Salmeterol (ADVAIR DISKUS) 250-50 MCG/DOSE AEPB Inhale 1 puff into the lungs 2 (two) times daily. 03/20/12   Historical Provider, MD  nitroGLYCERIN (NITROSTAT) 0.4 MG SL tablet Place 1 tablet (0.4 mg total) under the tongue every 5 (five) minutes x 3 doses as needed for chest pain. 06/11/16   Loletha Grayer, MD  omeprazole (PRILOSEC) 20 MG capsule Take 20 mg by mouth daily.    Historical Provider, MD   oxyCODONE-acetaminophen (ROXICET) 5-325 MG tablet Take 1-2 tablets by mouth every 6 (six) hours as needed for severe pain. 09/25/16   Lisa Roca, MD  predniSONE (STERAPRED UNI-PAK 21 TAB) 10 MG (21) TBPK tablet Take 6 tablets on day 1, take 5 tablets on day 2, take 4 tablets on day 3, take 3 tablets on day 4, take 2 tablets on day 5, take 1 tablet on day 6 10/19/16   Laban Emperor, PA-C  rOPINIRole (REQUIP) 0.5 MG tablet Take 1-2 tablets by mouth 2 (two) times daily as needed. 08/05/15   Historical Provider, MD  tamsulosin (FLOMAX) 0.4 MG CAPS capsule Take 1 capsule (0.4 mg total) by mouth daily. 07/06/16   Hollice Espy, MD  ticagrelor (BRILINTA) 90 MG TABS tablet Take 1 tablet (90 mg total) by mouth 2 (two) times daily. 06/11/16   Loletha Grayer, MD  traMADol (ULTRAM) 50 MG tablet Take 50 mg by mouth daily.    Historical Provider, MD    Allergies Anette Guarneri [lurasidone hcl]; Naproxen; and Saphris [asenapine]  Family History  Problem Relation Age of Onset  . Osteoarthritis Mother   . Heart disease Mother   . Hypertension Mother   . Heart failure Father   . Heart disease Father   . Early death Father   . Hypertension Father   . Hypertension Sister   . Prostate cancer Neg Hx   . Bladder Cancer Neg Hx   . Kidney cancer Neg Hx     Social History Social History  Substance Use Topics  . Smoking status: Current Every Day Smoker    Packs/day: 0.50    Types: Cigarettes    Last attempt to quit: 03/04/2012  . Smokeless tobacco: Never Used  . Alcohol use No     Review of Systems  Constitutional: Positive for fever/chills Cardiovascular: No chest pain. Respiratory: Positive for cough. Positive for SOB. Gastrointestinal: No abdominal pain.  No nausea, no vomiting.  Musculoskeletal: Positive for musculoskeletal pain. Skin: Negative for rash, abrasions, lacerations, ecchymosis. Neurological: Positive for headaches. Negative for numbness or  tingling   ____________________________________________   PHYSICAL EXAM:  VITAL SIGNS: ED Triage Vitals  Enc Vitals Group     BP 10/19/16 1607 136/63     Pulse Rate 10/19/16 1607 65     Resp 10/19/16 1607 18     Temp 10/19/16 1607 98.7 F (37.1 C)     Temp Source 10/19/16 1607 Oral     SpO2 10/19/16 1607 97 %     Weight --      Height --      Head Circumference --      Peak Flow --      Pain Score 10/19/16 1613 10     Pain Loc --      Pain Edu? --      Excl. in [place]? --      Constitutional: Alert and oriented. Well appearing and in no acute  distress. Eyes: Conjunctivae are normal. PERRL. EOMI. Head: Atraumatic. ENT:      Ears:      Nose: Mild congestion/rhinnorhea.      Mouth/Throat: Mucous membranes are moist. Oropharynx non-erythematous. Tonsils not enlarged. Uvula midline. Neck: No stridor.   Cardiovascular: Normal rate, regular rhythm.  Good peripheral circulation. Respiratory: Normal respiratory effort without tachypnea or retractions. Scattered wheezes. Good air entry to the bases with no decreased or absent breath sounds. Gastrointestinal: Bowel sounds 4 quadrants. Soft and nontender to palpation. No guarding or rigidity. No palpable masses. No distention.  Musculoskeletal: Full range of motion to all extremities. No gross deformities appreciated. Neurologic:  Normal speech and language. No gross focal neurologic deficits are appreciated.  Skin:  Skin is warm, dry and intact. No rash noted.   ____________________________________________   LABS (all labs ordered are listed, but only abnormal results are displayed)  Labs Reviewed - No data to display ____________________________________________  EKG   ____________________________________________  RADIOLOGY Robinette Haines, personally viewed and evaluated these images (plain radiographs) as part of my medical decision making, as well as reviewing the written report by the radiologist.  Dg Chest 2  View  Result Date: 10/19/2016 CLINICAL DATA:  Cough, congestion. EXAM: CHEST  2 VIEW COMPARISON:  Radiographs of May 24, 2016. FINDINGS: The heart size and mediastinal contours are within normal limits. No pneumothorax or pleural effusion is noted. Stable mild central pulmonary vascular congestion is noted. No consolidative process is noted. The visualized skeletal structures are unremarkable. IMPRESSION: Stable mild central pulmonary vascular congestion. No significant change compared to prior exam. Electronically Signed   By: Marijo Conception, M.D.   On: 10/19/2016 16:58    ____________________________________________    PROCEDURES  Procedure(s) performed:    Procedures    Medications  ipratropium-albuterol (DUONEB) 0.5-2.5 (3) MG/3ML nebulizer solution 3 mL (3 mLs Nebulization Given 10/19/16 1647)     ____________________________________________   INITIAL IMPRESSION / ASSESSMENT AND PLAN / ED COURSE  Pertinent labs & imaging results that were available during my care of the patient were reviewed by me and considered in my medical decision making (see chart for details).  Review of the [place] CSRS was performed in accordance of the [place] prior to dispensing any controlled drugs.     Patient's diagnosis is consistent with upper respiratory infection. Vital signs and exam are reassuring. Chest x-ray negative for acute cardiopulmonary processes. Patient was given DuoNeb in ED and states that he coughed up yellow sputum afterwards. Patient will be discharged home with prescriptions for azithromycin and prednisone. He will continue using inhalers. Patient is to follow up with PCP as directed. He has an appointment with PCP on April 3. Patient is given ED precautions to return to the ED for any worsening or new symptoms.     ____________________________________________  FINAL CLINICAL IMPRESSION(S) / ED DIAGNOSES  Final diagnoses:  Upper respiratory tract infection, unspecified  type      NEW MEDICATIONS STARTED DURING THIS VISIT:  Discharge Medication List as of 10/19/2016  5:43 PM    START taking these medications   Details  azithromycin (ZITHROMAX Z-PAK) 250 MG tablet Take 2 tablets (500 mg) on  Day 1,  followed by 1 tablet (250 mg) once daily on Days 2 through 5., Print    predniSONE (STERAPRED UNI-PAK 21 TAB) 10 MG (21) TBPK tablet Take 6 tablets on day 1, take 5 tablets on day 2, take 4 tablets on day 3, take 3 tablets on day  4, take 2 tablets on day 5, take 1 tablet on day 6, Print            This chart was dictated using voice recognition software/Dragon. Despite best efforts to proofread, errors can occur which can change the meaning. Any change was purely unintentional.    Laban Emperor, PA-C 10/19/16 1755    Orbie Pyo, MD 10/19/16 [phone]

## 2016-11-01 ENCOUNTER — Other Ambulatory Visit: Payer: Self-pay

## 2016-11-01 ENCOUNTER — Telehealth: Payer: Self-pay

## 2016-11-01 ENCOUNTER — Other Ambulatory Visit
Admission: RE | Admit: 2016-11-01 | Discharge: 2016-11-01 | Disposition: A | Discharge status: Home / Self Care | Payer: Medicaid Other | Source: Ambulatory Visit | Attending: Gastroenterology | Admitting: Gastroenterology

## 2016-11-01 ENCOUNTER — Ambulatory Visit (INDEPENDENT_AMBULATORY_CARE_PROVIDER_SITE_OTHER): Payer: Medicaid Other | Admitting: Gastroenterology

## 2016-11-01 ENCOUNTER — Encounter: Payer: Self-pay | Admitting: Gastroenterology

## 2016-11-01 VITALS — BP 150/75 | HR 64 | Temp 98.1°F | Ht 75.0 in | Wt 201.6 lb

## 2016-11-01 DIAGNOSIS — R194 Change in bowel habit: Secondary | ICD-10-CM | POA: Diagnosis not present

## 2016-11-01 DIAGNOSIS — K7460 Unspecified cirrhosis of liver: Secondary | ICD-10-CM

## 2016-11-01 DIAGNOSIS — R198 Other specified symptoms and signs involving the digestive system and abdomen: Secondary | ICD-10-CM

## 2016-11-01 LAB — CBC WITH DIFFERENTIAL/PLATELET
Basophils Absolute: 0 10*3/uL (ref 0–0.1)
Basophils Relative: 1 %
Eosinophils Absolute: 0.3 10*3/uL (ref 0–0.7)
Eosinophils Relative: 3 %
HCT: 42.5 % (ref 40.0–52.0)
HEMOGLOBIN: 14.8 g/dL (ref 13.0–18.0)
LYMPHS ABS: 2.1 10*3/uL (ref 1.0–3.6)
LYMPHS PCT: 22 %
MCH: 33 pg (ref 26.0–34.0)
MCHC: 34.7 g/dL (ref 32.0–36.0)
MCV: 94.9 fL (ref 80.0–100.0)
Monocytes Absolute: 0.5 10*3/uL (ref 0.2–1.0)
Monocytes Relative: 6 %
NEUTROS PCT: 68 %
Neutro Abs: 6.4 10*3/uL (ref 1.4–6.5)
Platelets: 183 10*3/uL (ref 150–440)
RBC: 4.48 MIL/uL (ref 4.40–5.90)
RDW: 14.2 % (ref 11.5–14.5)
WBC: 9.3 10*3/uL (ref 3.8–10.6)

## 2016-11-01 LAB — IRON AND TIBC
Iron: 83 ug/dL (ref 45–182)
SATURATION RATIOS: 25 % (ref 17.9–39.5)
TIBC: 336 ug/dL (ref 250–450)
UIBC: 253 ug/dL

## 2016-11-01 LAB — COMPREHENSIVE METABOLIC PANEL
ALT: 17 U/L (ref 17–63)
AST: 27 U/L (ref 15–41)
Albumin: 4.2 g/dL (ref 3.5–5.0)
Alkaline Phosphatase: 52 U/L (ref 38–126)
Anion gap: 7 (ref 5–15)
BUN: 11 mg/dL (ref 6–20)
CHLORIDE: 102 mmol/L (ref 101–111)
CO2: 27 mmol/L (ref 22–32)
Calcium: 8.8 mg/dL — ABNORMAL LOW (ref 8.9–10.3)
Creatinine, Ser: 0.77 mg/dL (ref 0.61–1.24)
Glucose, Bld: 97 mg/dL (ref 65–99)
POTASSIUM: 3.5 mmol/L (ref 3.5–5.1)
SODIUM: 136 mmol/L (ref 135–145)
Total Bilirubin: 0.6 mg/dL (ref 0.3–1.2)
Total Protein: 7.1 g/dL (ref 6.5–8.1)

## 2016-11-01 LAB — PROTIME-INR
INR: 1.02
PROTHROMBIN TIME: 13.4 s (ref 11.4–15.2)

## 2016-11-01 LAB — FERRITIN: Ferritin: 113 ng/mL (ref 24–336)

## 2016-11-01 NOTE — Progress Notes (Signed)
Gastroenterology Consultation  Referring Provider:     Ronnell Freshwater, NP Primary Care Physician:  Volanda Napoleon, MD Primary Gastroenterologist:  Dr. Jonathon Bellows  Reason for Consultation:     Abnormal MRI        HPI:   Keith Gibbs is a 56 y.o. y/o male referred for consultation & management  by Dr. Elijio Miles, Alfredia Ferguson AHMED, MD.    He has been referred for abnormality seen on recent MRI liver.   Review of prior imaging results: 03/2016 - RUQ USG 1.2 cm right lobe cyst  08/2016- CT hematuria workup  1.5 cm x 1.1 cm lesion in liver , mild gall bladder wall thickening  08/2016 - MRI liver- hepatic cirrhosis , tiny right hepatic lobe cyst  No recent labs   HCV viral load 02/2016 - not able to calculate   He says he had hepatitis C in the past and was treated 2 years back and says was cured. Did drink a lot of alcohol "back in the day " - stopped heavy drinking in his 1 's . 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 Was much heavier in the past and weight 300 lbs some years back, lost the weight intentionally , he is trying to quit smoking .  Abdominal pain: Onset: 6 months, slightly worse, daily basis Site :lower abdomen localized  Occurs when he is about to have a bowel movement  Aggravating factors: worse with passage of harder stool  Relieving factors :bowel movement  Weight loss: no  NSAID use: no  PPI use :omeprazole  Gall bladder surgery: none  Frequency of bowel movements: everyday  Change in bowel movements: yes in the last 6 months - some dairrhea  Relief with bowel movements: yes  Gas/Bloating/Abdominal distension: yes/   Past Medical History:  Diagnosis Date  . Anxiety   . Arthritis   . Bursitis   . COPD (chronic obstructive pulmonary disease) ([place])   . Depression   . Hepatitis C 2012   No longer has Hep C    Past Surgical History:  Procedure Laterality Date  . ABDOMINAL SURGERY     Intestines  . APPENDECTOMY    .  BACK SURGERY    . CARDIAC CATHETERIZATION Left 06/10/2016   Procedure: Left Heart Cath and Coronary Angiography;  Surgeon: Dionisio David, MD;  Location: [facility];  Service: Cardiovascular;  Laterality: Left;  . CARDIAC CATHETERIZATION N/A 06/10/2016   Procedure: Coronary Stent Intervention;  Surgeon: Yolonda Kida, MD;  Location: [facility];  Service: Cardiovascular;  Laterality: N/A;  . COLONOSCOPY    . SHOULDER SURGERY  04/09/2012    Prior to Admission medications   Medication Sig Start Date End Date Taking? Authorizing Provider  Fluticasone-Salmeterol (ADVAIR DISKUS) 250-50 MCG/DOSE AEPB Inhale into the lungs. 03/20/12  Yes Historical Provider, MD  acetaminophen (TYLENOL) 500 MG tablet Take 1,000 mg by mouth every 6 (six) hours as needed. For pain.    Historical Provider, MD  albuterol (PROAIR HFA) 108 (90 BASE) MCG/ACT inhaler Inhale 2 puffs into the lungs every 6 (six) hours as needed for wheezing or shortness of breath.  05/28/12   Historical Provider, MD  aspirin EC 81 MG tablet Take 81 mg by mouth daily.    Historical Provider, MD  atorvastatin (LIPITOR) 40 MG tablet Take 1 tablet (40 mg total) by mouth daily at 6 PM. 06/11/16   Loletha Grayer, MD  azithromycin (ZITHROMAX Z-PAK) 250 MG tablet Take 2 tablets (500 mg) on  Day 1,  followed by 1 tablet (250 mg) once daily on Days 2 through 5. 10/19/16   Laban Emperor, PA-C  buPROPion [facility] SR) 100 MG 12 hr tablet TAKE 1 TABLET(S) BY MOUTH DAILY 09/19/16   Historical Provider, MD  busPIRone (BUSPAR) 10 MG tablet Take 10 mg by mouth 2 (two) times daily.    Historical Provider, MD  cyclobenzaprine (FLEXERIL) 10 MG tablet Take 10 mg by mouth at bedtime.    Historical Provider, MD  DULERA 200-5 MCG/ACT AERO TAKE 2 PUFFS BY MOUTH TWICE A DAY 09/21/16   Historical Provider, MD  Fluticasone-Salmeterol (ADVAIR DISKUS) 250-50 MCG/DOSE AEPB Inhale 1 puff into the lungs 2 (two) times daily. 03/20/12   Historical Provider, MD    nitroGLYCERIN (NITROSTAT) 0.4 MG SL tablet Place 1 tablet (0.4 mg total) under the tongue every 5 (five) minutes x 3 doses as needed for chest pain. 06/11/16   Loletha Grayer, MD  omeprazole (PRILOSEC) 20 MG capsule Take 20 mg by mouth daily.    Historical Provider, MD  Omeprazole 20 MG TBEC Take 2 tablets by mouth daily. 10/28/16   Historical Provider, MD  oxyCODONE (OXY IR/ROXICODONE) 5 MG immediate release tablet TAKE 1 TABLET BY MOUTH 3 TIMES A DAY AS NEEDED FOR PAIN 10/30/16   Historical Provider, MD  oxyCODONE-acetaminophen (ROXICET) 5-325 MG tablet Take 1-2 tablets by mouth every 6 (six) hours as needed for severe pain. 09/25/16   Lisa Roca, MD  predniSONE (DELTASONE) 10 MG tablet TAKE AS DIRECTED 6,5,4,3,2,1 THEN STOP 10/19/16   Historical Provider, MD  rOPINIRole (REQUIP) 0.5 MG tablet Take 1-2 tablets by mouth 2 (two) times daily as needed. 08/05/15   Historical Provider, MD  tamsulosin (FLOMAX) 0.4 MG CAPS capsule Take 1 capsule (0.4 mg total) by mouth daily. 07/06/16   Hollice Espy, MD  ticagrelor (BRILINTA) 90 MG TABS tablet Take 1 tablet (90 mg total) by mouth 2 (two) times daily. 06/11/16   Loletha Grayer, MD  traMADol (ULTRAM) 50 MG tablet Take 50 mg by mouth daily.    Historical Provider, MD    Family History  Problem Relation Age of Onset  . Osteoarthritis Mother   . Heart disease Mother   . Hypertension Mother   . Heart failure Father   . Heart disease Father   . Early death Father   . Hypertension Father   . Hypertension Sister   . Prostate cancer Neg Hx   . Bladder Cancer Neg Hx   . Kidney cancer Neg Hx      Social History  Substance Use Topics  . Smoking status: Current Every Day Smoker    Packs/day: 0.50    Types: Cigarettes    Last attempt to quit: 03/04/2012  . Smokeless tobacco: Never Used  . Alcohol use No    Allergies as of 11/01/2016 - Review Complete 10/19/2016  Allergen Reaction Noted  . Latuda [lurasidone hcl] Other (See Comments) 01/14/2014  .  Naproxen Hives and Swelling 11/04/2011  . Saphris [asenapine] Other (See Comments) 01/17/2014    Review of Systems:    All systems reviewed and negative except where noted in HPI.   Physical Exam:  There were no vitals taken for this visit. No LMP for male patient. Psych:  Alert and cooperative. Normal mood and affect. General:   Alert,  Well-developed, well-nourished, pleasant and cooperative in NAD Head:  Normocephalic and atraumatic. Eyes:  Sclera clear, no  icterus.   Conjunctiva pink. Ears:  Normal auditory acuity. Nose:  No deformity, discharge, or lesions. Mouth:  No deformity or lesions,oropharynx pink & moist. Neck:  Supple; no masses or thyromegaly. Lungs:  Respirations even and unlabored.  Clear throughout to auscultation.   No wheezes, crackles, or rhonchi. No acute distress. Heart:  Regular rate and rhythm; no murmurs, clicks, rubs, or gallops. Abdomen:  Normal bowel sounds.  No bruits.  Soft, non-tender and non-distended without masses, hepatosplenomegaly or hernias noted.  No guarding or rebound tenderness.    Msk:  Symmetrical without gross deformities. Good, equal movement & strength bilaterally. Pulses:  Normal pulses noted. Extremities:  No clubbing or edema.  No cyanosis. Neurologic:  Alert and oriented x3;  grossly normal neurologically. Lymph Nodes:  No significant cervical adenopathy. Psych:  Alert and cooperative. Normal mood and affect.  Imaging Studies: Dg Chest 2 View  Result Date: 10/19/2016 CLINICAL DATA:  Cough, congestion. EXAM: CHEST  2 VIEW COMPARISON:  Radiographs of May 24, 2016. FINDINGS: The heart size and mediastinal contours are within normal limits. No pneumothorax or pleural effusion is noted. Stable mild central pulmonary vascular congestion is noted. No consolidative process is noted. The visualized skeletal structures are unremarkable. IMPRESSION: Stable mild central pulmonary vascular congestion. No significant change compared to prior  exam. Electronically Signed   By: Marijo Conception, M.D.   On: 10/19/2016 16:58    Assessment and Plan:   JADAVION SPOELSTRA is a 56 y.o. y/o male has been referred for cirrhosis of the liver- likely secondary to alcohol and hepatitis C which he has been cured. I think he also suffers from constipation likely worse due to use of tramadol and oxycodone which causes his abdominal pain during defecation. Since there is a change in his bowel movements he would need a colonoscopy.   Plan  1 Hepatic lobe cyst- benign no worrisome features per MRI 2. Incidental finding of liver cirrhosis - will obtain viral and autoimmune screen , needs RUQ USG Q 6 monthly to screen for HCC, no shell fish , EGD to screen for varices. Obtain hepatitis A/B immune status and vaccinate after  3.  EGD to screen for varices 4. Colonoscopy to evaluate change in bowel habits and he has had polyps in the past. He is on oxycodone.  5. High fiber diet , fiber pills samples provided.    Follow up in 6-8 weeks   Dr Jonathon Bellows MD

## 2016-11-01 NOTE — Telephone Encounter (Signed)
Gastroenterology Pre-Procedure Review  Request Date: 5/15 Requesting Physician: Dr. Vicente Males  PATIENT REVIEW QUESTIONS: The patient responded to the following health history questions as indicated:    1. Are you having any GI issues? yes (cirrhosis) 2. Do you have a personal history of Polyps? no 3. Do you have a family history of Colon Cancer or Polyps? no 4. Diabetes Mellitus? no 5. Joint replacements in the past 12 months?no 6. Major health problems in the past 3 months?yes (Cirrhosis of liver, hepatic cirrhosis, change in bowels) 7. Any artificial heart valves, MVP, or defibrillator?no    MEDICATIONS & ALLERGIES:    Patient reports the following regarding taking any anticoagulation/antiplatelet therapy:   Plavix, Coumadin, Eliquis, Xarelto, Lovenox, Pradaxa, Brilinta, or Effient? yes (Brilinta) Aspirin? yes (81mg )  Patient confirms/reports the following medications:  Current Outpatient Prescriptions  Medication Sig Dispense Refill  . acetaminophen (TYLENOL) 500 MG tablet Take 1,000 mg by mouth every 6 (six) hours as needed. For pain.    Marland Kitchen albuterol (PROAIR HFA) 108 (90 BASE) MCG/ACT inhaler Inhale 2 puffs into the lungs every 6 (six) hours as needed for wheezing or shortness of breath.     Marland Kitchen aspirin EC 81 MG tablet Take 81 mg by mouth daily.    Marland Kitchen atorvastatin (LIPITOR) 40 MG tablet Take 1 tablet (40 mg total) by mouth daily at 6 PM. 30 tablet 0  . azithromycin (ZITHROMAX Z-PAK) 250 MG tablet Take 2 tablets (500 mg) on  Day 1,  followed by 1 tablet (250 mg) once daily on Days 2 through 5. (Patient not taking: Reported on 11/01/2016) 6 each 0  . buPROPion (WELLBUTRIN SR) 100 MG 12 hr tablet TAKE 1 TABLET(S) BY MOUTH DAILY  3  . busPIRone (BUSPAR) 10 MG tablet Take 10 mg by mouth 2 (two) times daily.    . cyclobenzaprine (FLEXERIL) 10 MG tablet Take 10 mg by mouth at bedtime.    . divalproex (DEPAKOTE) 500 MG DR tablet Take 500 mg by mouth 2 (two) times daily.    . DULERA 200-5 MCG/ACT  AERO TAKE 2 PUFFS BY MOUTH TWICE A DAY  5  . Fluticasone-Salmeterol (ADVAIR DISKUS) 250-50 MCG/DOSE AEPB Inhale 1 puff into the lungs 2 (two) times daily.    . Fluticasone-Salmeterol (ADVAIR DISKUS) 250-50 MCG/DOSE AEPB Inhale into the lungs.    . nitroGLYCERIN (NITROSTAT) 0.4 MG SL tablet Place 1 tablet (0.4 mg total) under the tongue every 5 (five) minutes x 3 doses as needed for chest pain. 30 tablet 0  . omeprazole (PRILOSEC) 20 MG capsule Take 20 mg by mouth daily.    . Omeprazole 20 MG TBEC Take 2 tablets by mouth daily.  2  . oxyCODONE (OXY IR/ROXICODONE) 5 MG immediate release tablet TAKE 1 TABLET BY MOUTH 3 TIMES A DAY AS NEEDED FOR PAIN  0  . oxyCODONE-acetaminophen (ROXICET) 5-325 MG tablet Take 1-2 tablets by mouth every 6 (six) hours as needed for severe pain. (Patient not taking: Reported on 11/01/2016) 15 tablet 0  . predniSONE (DELTASONE) 10 MG tablet TAKE AS DIRECTED 6,5,4,3,2,1 THEN STOP  0  . rOPINIRole (REQUIP) 0.5 MG tablet Take 1-2 tablets by mouth 2 (two) times daily as needed.  1  . tamsulosin (FLOMAX) 0.4 MG CAPS capsule Take 1 capsule (0.4 mg total) by mouth daily. 30 capsule 11  . ticagrelor (BRILINTA) 90 MG TABS tablet Take 1 tablet (90 mg total) by mouth 2 (two) times daily. 60 tablet 0  . traMADol (ULTRAM) 50 MG tablet  Take 50 mg by mouth daily.     No current facility-administered medications for this visit.     Patient confirms/reports the following allergies:  Allergies  Allergen Reactions  . Latuda [Lurasidone Hcl] Other (See Comments)    tremor tremor  . Naproxen Hives and Swelling  . Saphris [Asenapine] Other (See Comments)    Increased tremors Increased tremors    No orders of the defined types were placed in this encounter.   AUTHORIZATION INFORMATION Primary Insurance: 1D#: Group #:  Secondary Insurance: 1D#: Group #:  SCHEDULE INFORMATION: Date: 5/15 Time: Location: [facility]

## 2016-11-02 LAB — HCV RNA QUANT: HCV Quantitative: NOT DETECTED IU/mL (ref >50)

## 2016-11-02 LAB — ALPHA-1 ANTITRYPSIN PHENOTYPE: A1 ANTITRYPSIN SER: 150 mg/dL (ref 90–200)

## 2016-11-02 LAB — CERULOPLASMIN: CERULOPLASMIN: 26.1 mg/dL (ref 16.0–31.0)

## 2016-11-02 LAB — HEPATITIS B SURFACE ANTIGEN: Hepatitis B Surface Ag: NEGATIVE

## 2016-11-02 LAB — ANA W/REFLEX: Anti Nuclear Antibody(ANA): NEGATIVE

## 2016-11-02 LAB — HEPATITIS B CORE ANTIBODY, TOTAL: HEP B C TOTAL AB: POSITIVE — AB

## 2016-11-02 LAB — ANTI-MICROSOMAL ANTIBODY LIVER / KIDNEY: LKM1 Ab: 2.2 Units (ref 0.0–20.0)

## 2016-11-02 LAB — HEPATITIS A ANTIBODY, TOTAL: Hep A Total Ab: POSITIVE — AB

## 2016-11-02 LAB — ANTI-SMOOTH MUSCLE ANTIBODY, IGG: F-Actin IgG: 10 Units (ref 0–19)

## 2016-11-02 LAB — HEPATITIS B SURFACE ANTIBODY, QUANTITATIVE

## 2016-11-02 LAB — MITOCHONDRIAL ANTIBODIES: Mitochondrial M2 Ab, IgG: 27.2 Units — ABNORMAL HIGH (ref 0.0–20.0)

## 2016-11-02 LAB — HEPATITIS C ANTIBODY

## 2016-11-03 ENCOUNTER — Other Ambulatory Visit
Admission: RE | Admit: 2016-11-03 | Discharge: 2016-11-03 | Disposition: A | Discharge status: Home / Self Care | Payer: Medicaid Other | Source: Ambulatory Visit | Attending: Gastroenterology | Admitting: Gastroenterology

## 2016-11-03 DIAGNOSIS — K7460 Unspecified cirrhosis of liver: Secondary | ICD-10-CM | POA: Insufficient documentation

## 2016-11-03 LAB — HEPATITIS B E ANTIBODY: Hep B E Ab: POSITIVE — AB

## 2016-11-05 LAB — IMMUNOGLOBULINS A/E/G/M, SERUM
IGG (IMMUNOGLOBIN G), SERUM: 658 mg/dL — AB (ref 700–1600)
IgA: 221 mg/dL (ref 90–386)
IgE (Immunoglobulin E), Serum: 3 IU/mL (ref 0–100)
IgM, Serum: 50 mg/dL (ref 20–172)

## 2016-11-06 LAB — H. PYLORI ANTIGEN, STOOL: H. Pylori Stool Ag, Eia: NEGATIVE

## 2016-11-29 ENCOUNTER — Telehealth: Payer: Self-pay

## 2016-11-29 NOTE — Telephone Encounter (Signed)
-----   Message from Jonathon Bellows, MD sent at 11/28/2016 10:14 AM EDT ----- 1. Appears he had hepatitis B too in the pastwhich he has cleared on his own .  2. mitocondrial antibodypositive but has normal LFT's - suggest checking LFt's Q 72monttly  Other tests checked are normal

## 2016-11-29 NOTE — Telephone Encounter (Signed)
Advised patent of results per Dr. Vicente Males.   1. Appears he had hepatitis B too in the pastwhich he has cleared on his own .  2. mitocondrial antibodypositive but has normal LFT's - suggest checking LFt's Q 33monttly  Other tests checked are normal

## 2016-12-06 ENCOUNTER — Ambulatory Visit: Admission: RE | Admit: 2016-12-06 | Payer: Medicaid Other | Source: Ambulatory Visit | Admitting: Gastroenterology

## 2016-12-06 ENCOUNTER — Encounter: Admission: RE | Payer: Self-pay | Source: Ambulatory Visit

## 2016-12-06 SURGERY — COLONOSCOPY WITH PROPOFOL
Anesthesia: General

## 2016-12-06 MED ORDER — FENTANYL CITRATE (PF) 100 MCG/2ML IJ SOLN
INTRAMUSCULAR | Status: AC
  Filled 2016-12-06: qty 2

## 2016-12-06 MED ORDER — MIDAZOLAM HCL 2 MG/2ML IJ SOLN
INTRAMUSCULAR | Status: AC
  Filled 2016-12-06: qty 2

## 2016-12-07 ENCOUNTER — Telehealth: Payer: Self-pay | Admitting: Gastroenterology

## 2016-12-07 NOTE — Telephone Encounter (Signed)
Patient called to r/s colonoscopy. 

## 2016-12-08 ENCOUNTER — Telehealth: Payer: Self-pay | Admitting: Gastroenterology

## 2016-12-08 NOTE — Telephone Encounter (Signed)
Patient needs to reschedule his colonoscopy.

## 2016-12-08 NOTE — Telephone Encounter (Signed)
LVM for pt to contact office to reschedule colonoscopy. 

## 2016-12-09 ENCOUNTER — Telehealth: Payer: Self-pay | Admitting: Gastroenterology

## 2016-12-09 ENCOUNTER — Other Ambulatory Visit: Payer: Self-pay

## 2016-12-09 DIAGNOSIS — K7460 Unspecified cirrhosis of liver: Secondary | ICD-10-CM

## 2016-12-09 NOTE — Telephone Encounter (Signed)
12/09/16 Faxed Prior Auth form to [facility].

## 2016-12-15 NOTE — Telephone Encounter (Signed)
Patient left a voice message that he believes he needs to make an appointment for a colonoscopy. Please check and call

## 2016-12-26 ENCOUNTER — Emergency Department: Payer: Medicaid Other

## 2016-12-26 ENCOUNTER — Encounter: Payer: Self-pay | Admitting: Emergency Medicine

## 2016-12-26 ENCOUNTER — Emergency Department
Admission: EM | Admit: 2016-12-26 | Discharge: 2016-12-27 | Disposition: A | Discharge status: Other Acute Inpatient Hospital | Payer: Medicaid Other | Attending: Emergency Medicine | Admitting: Emergency Medicine

## 2016-12-26 DIAGNOSIS — T1491XA Suicide attempt, initial encounter: Secondary | ICD-10-CM | POA: Diagnosis not present

## 2016-12-26 DIAGNOSIS — F1420 Cocaine dependence, uncomplicated: Secondary | ICD-10-CM

## 2016-12-26 DIAGNOSIS — F209 Schizophrenia, unspecified: Secondary | ICD-10-CM | POA: Insufficient documentation

## 2016-12-26 DIAGNOSIS — R0602 Shortness of breath: Secondary | ICD-10-CM | POA: Insufficient documentation

## 2016-12-26 DIAGNOSIS — Z7902 Long term (current) use of antithrombotics/antiplatelets: Secondary | ICD-10-CM | POA: Diagnosis not present

## 2016-12-26 DIAGNOSIS — X838XXA Intentional self-harm by other specified means, initial encounter: Secondary | ICD-10-CM | POA: Insufficient documentation

## 2016-12-26 DIAGNOSIS — F1220 Cannabis dependence, uncomplicated: Secondary | ICD-10-CM

## 2016-12-26 DIAGNOSIS — R42 Dizziness and giddiness: Secondary | ICD-10-CM | POA: Diagnosis present

## 2016-12-26 DIAGNOSIS — F1410 Cocaine abuse, uncomplicated: Secondary | ICD-10-CM | POA: Diagnosis not present

## 2016-12-26 DIAGNOSIS — J449 Chronic obstructive pulmonary disease, unspecified: Secondary | ICD-10-CM | POA: Diagnosis present

## 2016-12-26 DIAGNOSIS — R55 Syncope and collapse: Secondary | ICD-10-CM | POA: Diagnosis not present

## 2016-12-26 DIAGNOSIS — Y9389 Activity, other specified: Secondary | ICD-10-CM | POA: Insufficient documentation

## 2016-12-26 DIAGNOSIS — F332 Major depressive disorder, recurrent severe without psychotic features: Secondary | ICD-10-CM | POA: Diagnosis not present

## 2016-12-26 DIAGNOSIS — Y92009 Unspecified place in unspecified non-institutional (private) residence as the place of occurrence of the external cause: Secondary | ICD-10-CM | POA: Diagnosis not present

## 2016-12-26 DIAGNOSIS — K219 Gastro-esophageal reflux disease without esophagitis: Secondary | ICD-10-CM | POA: Diagnosis present

## 2016-12-26 DIAGNOSIS — F17210 Nicotine dependence, cigarettes, uncomplicated: Secondary | ICD-10-CM | POA: Insufficient documentation

## 2016-12-26 DIAGNOSIS — J441 Chronic obstructive pulmonary disease with (acute) exacerbation: Secondary | ICD-10-CM | POA: Diagnosis present

## 2016-12-26 DIAGNOSIS — F319 Bipolar disorder, unspecified: Secondary | ICD-10-CM | POA: Insufficient documentation

## 2016-12-26 DIAGNOSIS — I25118 Atherosclerotic heart disease of native coronary artery with other forms of angina pectoris: Secondary | ICD-10-CM

## 2016-12-26 DIAGNOSIS — Y998 Other external cause status: Secondary | ICD-10-CM | POA: Diagnosis not present

## 2016-12-26 DIAGNOSIS — Z7982 Long term (current) use of aspirin: Secondary | ICD-10-CM | POA: Insufficient documentation

## 2016-12-26 DIAGNOSIS — I2510 Atherosclerotic heart disease of native coronary artery without angina pectoris: Secondary | ICD-10-CM

## 2016-12-26 DIAGNOSIS — F1020 Alcohol dependence, uncomplicated: Secondary | ICD-10-CM

## 2016-12-26 LAB — BRAIN NATRIURETIC PEPTIDE: B Natriuretic Peptide: 12 pg/mL (ref 0.0–100.0)

## 2016-12-26 LAB — CBC
HCT: 41 % (ref 40.0–52.0)
Hemoglobin: 14.5 g/dL (ref 13.0–18.0)
MCH: 33.1 pg (ref 26.0–34.0)
MCHC: 35.4 g/dL (ref 32.0–36.0)
MCV: 93.5 fL (ref 80.0–100.0)
PLATELETS: 201 10*3/uL (ref 150–440)
RBC: 4.38 MIL/uL — AB (ref 4.40–5.90)
RDW: 13.5 % (ref 11.5–14.5)
WBC: 9.3 10*3/uL (ref 3.8–10.6)

## 2016-12-26 LAB — BASIC METABOLIC PANEL
Anion gap: 12 (ref 5–15)
BUN: 14 mg/dL (ref 6–20)
CHLORIDE: 103 mmol/L (ref 101–111)
CO2: 20 mmol/L — AB (ref 22–32)
CREATININE: 0.84 mg/dL (ref 0.61–1.24)
Calcium: 9.2 mg/dL (ref 8.9–10.3)
GFR calc non Af Amer: >60 mL/min (ref >60)
Glucose, Bld: 85 mg/dL (ref 65–99)
Potassium: 3.6 mmol/L (ref 3.5–5.1)
Sodium: 135 mmol/L (ref 135–145)

## 2016-12-26 LAB — ACETAMINOPHEN LEVEL: Acetaminophen (Tylenol), Serum: <10 ug/mL — ABNORMAL LOW (ref 10–30)

## 2016-12-26 LAB — TROPONIN I

## 2016-12-26 LAB — ETHANOL: Alcohol, Ethyl (B): 29 mg/dL — ABNORMAL HIGH (ref <5)

## 2016-12-26 LAB — SALICYLATE LEVEL: Salicylate Lvl: <7 mg/dL (ref 2.8–30.0)

## 2016-12-26 NOTE — ED Triage Notes (Addendum)
Pt ambulatory to triage with steady gait c/o dizziness x 2.5 months and syncopal episodes x 1 week. Pt reports symptoms have been increasing this week. Pt reports saw cardiologist on 6/1 and states was told " I had a heart attack and didn't even know it and a leaky valve." Pt also states feeling short of breath, states rescue inhaler did not help. Pt speaking in complete sentences, respirations even and unlabored.  Spouse in triage room stating patient needs psychiatric assistance. Pt reports attempted suicide last night by taking "a week's worth of my medications." Is unsure what medicines he actually took. Pt reports drinking heavily last night. Pt unsure if he used drugs in the weekend states "I don't know I drink and then pass out, I don't remember." Pt calm and cooperative while in triage. Pt reports has been drinking every day for the past couple of months. Drank one 40 oz beer today.

## 2016-12-27 ENCOUNTER — Inpatient Hospital Stay
Admission: AD | Admit: 2016-12-27 | Discharge: 2016-12-30 | DRG: 885 | Disposition: A | Discharge status: Home / Self Care | Payer: Medicaid Other | Source: Intra-hospital | Attending: Psychiatry | Admitting: Psychiatry

## 2016-12-27 DIAGNOSIS — K219 Gastro-esophageal reflux disease without esophagitis: Secondary | ICD-10-CM | POA: Diagnosis present

## 2016-12-27 DIAGNOSIS — I2510 Atherosclerotic heart disease of native coronary artery without angina pectoris: Secondary | ICD-10-CM

## 2016-12-27 DIAGNOSIS — F1420 Cocaine dependence, uncomplicated: Secondary | ICD-10-CM | POA: Diagnosis present

## 2016-12-27 DIAGNOSIS — Z888 Allergy status to other drugs, medicaments and biological substances status: Secondary | ICD-10-CM

## 2016-12-27 DIAGNOSIS — J441 Chronic obstructive pulmonary disease with (acute) exacerbation: Secondary | ICD-10-CM | POA: Diagnosis present

## 2016-12-27 DIAGNOSIS — F1020 Alcohol dependence, uncomplicated: Secondary | ICD-10-CM

## 2016-12-27 DIAGNOSIS — Z7982 Long term (current) use of aspirin: Secondary | ICD-10-CM | POA: Diagnosis not present

## 2016-12-27 DIAGNOSIS — M1610 Unilateral primary osteoarthritis, unspecified hip: Secondary | ICD-10-CM | POA: Diagnosis present

## 2016-12-27 DIAGNOSIS — Z8619 Personal history of other infectious and parasitic diseases: Secondary | ICD-10-CM | POA: Diagnosis not present

## 2016-12-27 DIAGNOSIS — Z79899 Other long term (current) drug therapy: Secondary | ICD-10-CM

## 2016-12-27 DIAGNOSIS — Z915 Personal history of self-harm: Secondary | ICD-10-CM

## 2016-12-27 DIAGNOSIS — Z8659 Personal history of other mental and behavioral disorders: Secondary | ICD-10-CM

## 2016-12-27 DIAGNOSIS — G4700 Insomnia, unspecified: Secondary | ICD-10-CM | POA: Diagnosis present

## 2016-12-27 DIAGNOSIS — T50902S Poisoning by unspecified drugs, medicaments and biological substances, intentional self-harm, sequela: Secondary | ICD-10-CM

## 2016-12-27 DIAGNOSIS — N400 Enlarged prostate without lower urinary tract symptoms: Secondary | ICD-10-CM | POA: Diagnosis present

## 2016-12-27 DIAGNOSIS — F1220 Cannabis dependence, uncomplicated: Secondary | ICD-10-CM | POA: Diagnosis present

## 2016-12-27 DIAGNOSIS — F17210 Nicotine dependence, cigarettes, uncomplicated: Secondary | ICD-10-CM | POA: Diagnosis present

## 2016-12-27 DIAGNOSIS — Z638 Other specified problems related to primary support group: Secondary | ICD-10-CM

## 2016-12-27 DIAGNOSIS — Z653 Problems related to other legal circumstances: Secondary | ICD-10-CM

## 2016-12-27 DIAGNOSIS — F429 Obsessive-compulsive disorder, unspecified: Secondary | ICD-10-CM | POA: Diagnosis present

## 2016-12-27 DIAGNOSIS — F332 Major depressive disorder, recurrent severe without psychotic features: Principal | ICD-10-CM | POA: Diagnosis present

## 2016-12-27 DIAGNOSIS — Z8739 Personal history of other diseases of the musculoskeletal system and connective tissue: Secondary | ICD-10-CM

## 2016-12-27 DIAGNOSIS — T50902A Poisoning by unspecified drugs, medicaments and biological substances, intentional self-harm, initial encounter: Secondary | ICD-10-CM | POA: Diagnosis present

## 2016-12-27 DIAGNOSIS — F4310 Post-traumatic stress disorder, unspecified: Secondary | ICD-10-CM | POA: Diagnosis present

## 2016-12-27 DIAGNOSIS — I25118 Atherosclerotic heart disease of native coronary artery with other forms of angina pectoris: Secondary | ICD-10-CM | POA: Diagnosis present

## 2016-12-27 DIAGNOSIS — Z7951 Long term (current) use of inhaled steroids: Secondary | ICD-10-CM

## 2016-12-27 DIAGNOSIS — E785 Hyperlipidemia, unspecified: Secondary | ICD-10-CM | POA: Diagnosis present

## 2016-12-27 DIAGNOSIS — Z886 Allergy status to analgesic agent status: Secondary | ICD-10-CM

## 2016-12-27 DIAGNOSIS — Y901 Blood alcohol level of 20-39 mg/100 ml: Secondary | ICD-10-CM | POA: Diagnosis present

## 2016-12-27 DIAGNOSIS — J449 Chronic obstructive pulmonary disease, unspecified: Secondary | ICD-10-CM | POA: Diagnosis present

## 2016-12-27 DIAGNOSIS — G8929 Other chronic pain: Secondary | ICD-10-CM | POA: Diagnosis present

## 2016-12-27 DIAGNOSIS — R55 Syncope and collapse: Secondary | ICD-10-CM | POA: Diagnosis not present

## 2016-12-27 DIAGNOSIS — Z818 Family history of other mental and behavioral disorders: Secondary | ICD-10-CM

## 2016-12-27 DIAGNOSIS — F17200 Nicotine dependence, unspecified, uncomplicated: Secondary | ICD-10-CM | POA: Diagnosis present

## 2016-12-27 DIAGNOSIS — Z599 Problem related to housing and economic circumstances, unspecified: Secondary | ICD-10-CM

## 2016-12-27 DIAGNOSIS — R001 Bradycardia, unspecified: Secondary | ICD-10-CM | POA: Diagnosis present

## 2016-12-27 DIAGNOSIS — Z955 Presence of coronary angioplasty implant and graft: Secondary | ICD-10-CM

## 2016-12-27 LAB — URINALYSIS, COMPLETE (UACMP) WITH MICROSCOPIC
BILIRUBIN URINE: NEGATIVE
Bacteria, UA: NONE SEEN
Glucose, UA: NEGATIVE mg/dL
Ketones, ur: NEGATIVE mg/dL
Leukocytes, UA: NEGATIVE
NITRITE: NEGATIVE
PH: 5 (ref 5.0–8.0)
Protein, ur: NEGATIVE mg/dL
SPECIFIC GRAVITY, URINE: 1.005 (ref 1.005–1.030)

## 2016-12-27 LAB — URINE DRUG SCREEN, QUALITATIVE (ARMC ONLY)
Amphetamines, Ur Screen: NOT DETECTED
BARBITURATES, UR SCREEN: NOT DETECTED
Benzodiazepine, Ur Scrn: NOT DETECTED
CANNABINOID 50 NG, UR ~~LOC~~: NOT DETECTED
COCAINE METABOLITE, UR ~~LOC~~: POSITIVE — AB
MDMA (Ecstasy)Ur Screen: NOT DETECTED
Methadone Scn, Ur: NOT DETECTED
OPIATE, UR SCREEN: NOT DETECTED
PHENCYCLIDINE (PCP) UR S: NOT DETECTED
Tricyclic, Ur Screen: NOT DETECTED

## 2016-12-27 MED ORDER — NITROGLYCERIN 0.4 MG SL SUBL: 0.4000 mg | SUBLINGUAL_TABLET | SUBLINGUAL | Status: DC | PRN

## 2016-12-27 MED ORDER — PANTOPRAZOLE SODIUM 40 MG PO TBEC
40.0000 mg | DELAYED_RELEASE_TABLET | Freq: Every day | ORAL | Status: DC
  Administered 2016-12-27: 40 mg via ORAL
  Filled 2016-12-27: qty 1

## 2016-12-27 MED ORDER — FOLIC ACID 1 MG PO TABS
1.0000 mg | ORAL_TABLET | Freq: Every day | ORAL | Status: DC
  Administered 2016-12-27: 1 mg via ORAL
  Filled 2016-12-27: qty 1

## 2016-12-27 MED ORDER — ADULT MULTIVITAMIN W/MINERALS CH
1.0000 | ORAL_TABLET | Freq: Every day | ORAL | Status: DC
  Administered 2016-12-27: 1 via ORAL
  Filled 2016-12-27: qty 1

## 2016-12-27 MED ORDER — ASPIRIN EC 81 MG PO TBEC
81.0000 mg | DELAYED_RELEASE_TABLET | Freq: Every day | ORAL | Status: DC
  Administered 2016-12-27: 81 mg via ORAL
  Filled 2016-12-27: qty 1

## 2016-12-27 MED ORDER — OXYCODONE HCL 5 MG PO TABS: 5.0000 mg | ORAL_TABLET | Freq: Three times a day (TID) | ORAL | Status: DC

## 2016-12-27 MED ORDER — THIAMINE HCL 100 MG/ML IJ SOLN: 100.0000 mg | Freq: Every day | INTRAMUSCULAR | Status: DC

## 2016-12-27 MED ORDER — ASPIRIN EC 81 MG PO TBEC
81.0000 mg | DELAYED_RELEASE_TABLET | Freq: Every day | ORAL | Status: DC
  Administered 2016-12-28 – 2016-12-30 (×3): 81 mg via ORAL
  Filled 2016-12-27 (×3): qty 1

## 2016-12-27 MED ORDER — LORAZEPAM 2 MG/ML IJ SOLN: 1.0000 mg | Freq: Four times a day (QID) | INTRAMUSCULAR | Status: DC | PRN

## 2016-12-27 MED ORDER — TRAZODONE HCL 100 MG PO TABS
100.0000 mg | ORAL_TABLET | Freq: Every evening | ORAL | Status: DC | PRN
  Administered 2016-12-27: 100 mg via ORAL

## 2016-12-27 MED ORDER — OXYCODONE HCL 5 MG PO TABS
5.0000 mg | ORAL_TABLET | Freq: Three times a day (TID) | ORAL | Status: DC
  Administered 2016-12-27: 5 mg via ORAL
  Filled 2016-12-27: qty 1

## 2016-12-27 MED ORDER — TICAGRELOR 90 MG PO TABS: 90.0000 mg | ORAL_TABLET | Freq: Two times a day (BID) | ORAL | Status: DC

## 2016-12-27 MED ORDER — ISOSORBIDE MONONITRATE ER 60 MG PO TB24
30.0000 mg | ORAL_TABLET | Freq: Every day | ORAL | Status: DC
  Administered 2016-12-27: 30 mg via ORAL
  Filled 2016-12-27: qty 1

## 2016-12-27 MED ORDER — ALUM & MAG HYDROXIDE-SIMETH 200-200-20 MG/5ML PO SUSP: 30.0000 mL | ORAL | Status: DC | PRN

## 2016-12-27 MED ORDER — MOMETASONE FURO-FORMOTEROL FUM 200-5 MCG/ACT IN AERO: 2.0000 | INHALATION_SPRAY | Freq: Two times a day (BID) | RESPIRATORY_TRACT | Status: DC

## 2016-12-27 MED ORDER — TAMSULOSIN HCL 0.4 MG PO CAPS
0.4000 mg | ORAL_CAPSULE | Freq: Every day | ORAL | Status: DC
  Administered 2016-12-27 – 2016-12-29 (×3): 0.4 mg via ORAL
  Filled 2016-12-27 (×3): qty 1

## 2016-12-27 MED ORDER — OXYCODONE-ACETAMINOPHEN 5-325 MG PO TABS
1.0000 | ORAL_TABLET | Freq: Once | ORAL | Status: AC
  Administered 2016-12-27: 1 via ORAL
  Filled 2016-12-27: qty 1

## 2016-12-27 MED ORDER — ISOSORBIDE MONONITRATE ER 30 MG PO TB24
30.0000 mg | ORAL_TABLET | Freq: Every day | ORAL | Status: DC
  Administered 2016-12-28 – 2016-12-30 (×3): 30 mg via ORAL
  Filled 2016-12-27 (×3): qty 1

## 2016-12-27 MED ORDER — ATORVASTATIN CALCIUM 20 MG PO TABS
40.0000 mg | ORAL_TABLET | Freq: Every day | ORAL | Status: DC
  Administered 2016-12-27 – 2016-12-29 (×3): 40 mg via ORAL
  Filled 2016-12-27 (×3): qty 2

## 2016-12-27 MED ORDER — MOMETASONE FURO-FORMOTEROL FUM 200-5 MCG/ACT IN AERO
2.0000 | INHALATION_SPRAY | Freq: Two times a day (BID) | RESPIRATORY_TRACT | Status: DC
  Administered 2016-12-27 – 2016-12-30 (×6): 2 via RESPIRATORY_TRACT
  Filled 2016-12-27: qty 8.8

## 2016-12-27 MED ORDER — TAMSULOSIN HCL 0.4 MG PO CAPS: 0.4000 mg | ORAL_CAPSULE | Freq: Every day | ORAL | Status: DC

## 2016-12-27 MED ORDER — LORAZEPAM 1 MG PO TABS: 1.0000 mg | ORAL_TABLET | Freq: Four times a day (QID) | ORAL | Status: DC | PRN

## 2016-12-27 MED ORDER — CHLORDIAZEPOXIDE HCL 25 MG PO CAPS
25.0000 mg | ORAL_CAPSULE | Freq: Once | ORAL | Status: AC
  Administered 2016-12-27: 25 mg via ORAL
  Filled 2016-12-27: qty 1

## 2016-12-27 MED ORDER — THIAMINE HCL 100 MG/ML IJ SOLN
Freq: Once | INTRAVENOUS | Status: AC
  Administered 2016-12-27: 02:00:00 via INTRAVENOUS
  Filled 2016-12-27: qty 1000

## 2016-12-27 MED ORDER — VITAMIN B-1 100 MG PO TABS: 100.0000 mg | ORAL_TABLET | Freq: Every day | ORAL | Status: DC

## 2016-12-27 MED ORDER — ALBUTEROL SULFATE HFA 108 (90 BASE) MCG/ACT IN AERS
2.0000 | INHALATION_SPRAY | RESPIRATORY_TRACT | Status: DC | PRN
  Filled 2016-12-27: qty 6.7

## 2016-12-27 MED ORDER — MAGNESIUM HYDROXIDE 400 MG/5ML PO SUSP: 30.0000 mL | Freq: Every day | ORAL | Status: DC | PRN

## 2016-12-27 MED ORDER — TICAGRELOR 90 MG PO TABS
90.0000 mg | ORAL_TABLET | Freq: Two times a day (BID) | ORAL | Status: DC
  Administered 2016-12-27 – 2016-12-30 (×6): 90 mg via ORAL
  Filled 2016-12-27 (×6): qty 1

## 2016-12-27 MED ORDER — ACETAMINOPHEN 325 MG PO TABS
650.0000 mg | ORAL_TABLET | Freq: Four times a day (QID) | ORAL | Status: DC | PRN
  Administered 2016-12-28: 650 mg via ORAL
  Filled 2016-12-27: qty 2

## 2016-12-27 MED ORDER — VITAMIN B-1 100 MG PO TABS
100.0000 mg | ORAL_TABLET | Freq: Every day | ORAL | Status: DC
  Administered 2016-12-27: 100 mg via ORAL
  Filled 2016-12-27: qty 1

## 2016-12-27 MED ORDER — NICOTINE 21 MG/24HR TD PT24
21.0000 mg | MEDICATED_PATCH | Freq: Once | TRANSDERMAL | Status: DC
  Administered 2016-12-27: 21 mg via TRANSDERMAL
  Filled 2016-12-27: qty 1

## 2016-12-27 MED ORDER — PANTOPRAZOLE SODIUM 40 MG PO TBEC
40.0000 mg | DELAYED_RELEASE_TABLET | Freq: Every day | ORAL | Status: DC
  Administered 2016-12-28 – 2016-12-30 (×3): 40 mg via ORAL
  Filled 2016-12-27 (×3): qty 1

## 2016-12-27 MED ORDER — ATORVASTATIN CALCIUM 20 MG PO TABS: 40.0000 mg | ORAL_TABLET | Freq: Every day | ORAL | Status: DC
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
## 2016-12-27 NOTE — ED Provider Notes (Signed)
-----------------------------------------   3:56 PM on 12/27/2016 -----------------------------------------  Spoke in person with Dr. Weber Cooks who will admit the patient downstairs to Behavioral Medicine Unit.  Patient is calm and cooperative at this time.   Hinda Kehr, MD 12/27/16 1556
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
## 2016-12-27 NOTE — [facility] Group Notes (Signed)
[employer] [facility] Notes:  (Nursing/MHT/Case Management/Adjunct)  Date:  12/27/2016  Time:  9:25 PM  Type of Therapy:  Group Therapy  Participation Level:  Active  Participation Quality:  Appropriate  Affect:  Appropriate  Cognitive:  Appropriate  Insight:  Appropriate  Engagement in Group:  Engaged  Modes of Intervention:  Discussion  Summary of Progress/Problems:  Kandis Fantasia 12/27/2016, 9:25 PM
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
## 2016-12-27 NOTE — ED Provider Notes (Signed)
-----------------------------------------   9:47 AM on 12/27/2016 -----------------------------------------  Patient was signed out to me this morning, medically cleared and awaiting psychiatric consultation. According to note, patient did "all of his medications" in an attempt to kill himself 2 days ago. He is not yet under IVC but this time he would like to leave to go smoking. I don't think that is safe therefore we will take out IVC paperwork on the patient. We will give him a nicotine patch.   Schuyler Amor, MD 12/27/16 [phone]
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
## 2016-12-27 NOTE — ED Notes (Signed)
Patient on phone at this time 

## 2016-12-27 NOTE — ED Notes (Signed)
Lunch was given to patient 

## 2016-12-27 NOTE — ED Notes (Signed)
[place] at bedside 

## 2016-12-27 NOTE — ED Notes (Signed)
Pt dressed out into appropriate behavioral health clothing with myself and Amy T.,RN in the rm. Pt has some belonging tied up already in a pt belongings bag. Pt had on a hospital gown. Pt belongings consist of a pair of black sandels, a pair of white socks, grey boxers and white shorts. Pt walked over to rm 21.
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
## 2016-12-27 NOTE — BH Assessment (Signed)
Patient is to be admitted to [facility] by Dr. Weber Cooks.  Attending Physician will be Dr. Bary Leriche.   Patient has been assigned to room 301, by [place].   Intake Paper Work has been signed and placed on patient chart.  ER staff is aware of the admission [facility].; Dr. Karma Greaser , ER MD; Luetta Nutting  Patient's Nurse & Erline Levine Patient Access).
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
## 2016-12-27 NOTE — ED Notes (Signed)
Breakfast was given to patient. 

## 2016-12-27 NOTE — ED Notes (Signed)
Pt ambulatory outside of room without difficulty, states he needs to go outside to smoke, pt made aware that is not allowed.  MD notified.  See new orders.

## 2016-12-27 NOTE — Tx Team (Signed)
Initial Treatment Plan 12/27/2016 6:18 PM GLENNIE RODDA SJG:[id number]    PATIENT STRESSORS: Financial difficulties Health problems Marital or family conflict   PATIENT STRENGTHS: Ability for insight Average or above average intelligence Communication skills General fund of knowledge Motivation for treatment/growth   PATIENT IDENTIFIED PROBLEMS: Suicidal ideations   Depression  Anxiety                  DISCHARGE CRITERIA:  Ability to meet basic life and health needs Adequate post-discharge living arrangements Improved stabilization in mood, thinking, and/or behavior Medical problems require only outpatient monitoring  PRELIMINARY DISCHARGE PLAN: Attend aftercare/continuing care group Outpatient therapy  PATIENT/FAMILY INVOLVEMENT: This treatment plan has been presented to and reviewed with the patient, Keith Gibbs, and/or family member, .  The patient and family have been given the opportunity to ask questions and make suggestions.  Evaristo Bury, RN 12/27/2016, 6:18 PM
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
## 2016-12-28 ENCOUNTER — Encounter: Payer: Self-pay | Admitting: Psychiatry

## 2016-12-28 DIAGNOSIS — F17200 Nicotine dependence, unspecified, uncomplicated: Secondary | ICD-10-CM | POA: Diagnosis present

## 2016-12-28 DIAGNOSIS — T50902S Poisoning by unspecified drugs, medicaments and biological substances, intentional self-harm, sequela: Secondary | ICD-10-CM

## 2016-12-28 DIAGNOSIS — F332 Major depressive disorder, recurrent severe without psychotic features: Principal | ICD-10-CM

## 2016-12-28 LAB — TSH: TSH: 0.99 u[IU]/mL (ref 0.350–4.500)

## 2016-12-28 LAB — LIPID PANEL
CHOL/HDL RATIO: 1.7 ratio
CHOLESTEROL: 81 mg/dL (ref 0–200)
HDL: 47 mg/dL (ref >40)
LDL Cholesterol: 22 mg/dL (ref 0–99)
TRIGLYCERIDES: 58 mg/dL (ref <150)
VLDL: 12 mg/dL (ref 0–40)

## 2016-12-28 MED ORDER — QUETIAPINE FUMARATE 100 MG PO TABS
100.0000 mg | ORAL_TABLET | Freq: Every day | ORAL | Status: DC
  Administered 2016-12-28 – 2016-12-29 (×2): 100 mg via ORAL
  Filled 2016-12-28 (×2): qty 1

## 2016-12-28 MED ORDER — CHLORDIAZEPOXIDE HCL 25 MG PO CAPS
25.0000 mg | ORAL_CAPSULE | Freq: Four times a day (QID) | ORAL | Status: DC
  Administered 2016-12-28 – 2016-12-29 (×6): 25 mg via ORAL
  Filled 2016-12-28 (×6): qty 1

## 2016-12-28 MED ORDER — QUETIAPINE FUMARATE 25 MG PO TABS
25.0000 mg | ORAL_TABLET | Freq: Three times a day (TID) | ORAL | Status: DC
  Administered 2016-12-28 – 2016-12-29 (×3): 25 mg via ORAL
  Filled 2016-12-28 (×3): qty 1

## 2016-12-28 MED ORDER — OXYCODONE-ACETAMINOPHEN 5-325 MG PO TABS
1.0000 | ORAL_TABLET | Freq: Three times a day (TID) | ORAL | Status: DC | PRN
  Administered 2016-12-28 – 2016-12-30 (×4): 1 via ORAL
  Filled 2016-12-28 (×4): qty 1

## 2016-12-28 MED ORDER — ENSURE ENLIVE PO LIQD
237.0000 mL | Freq: Two times a day (BID) | ORAL | Status: DC
  Administered 2016-12-29 – 2016-12-30 (×3): 237 mL via ORAL

## 2016-12-28 MED ORDER — FLUVOXAMINE MALEATE 50 MG PO TABS
50.0000 mg | ORAL_TABLET | Freq: Every day | ORAL | Status: DC
  Administered 2016-12-28: 50 mg via ORAL
  Filled 2016-12-28: qty 1

## 2016-12-28 NOTE — Tx Team (Addendum)
Interdisciplinary Treatment and Diagnostic Plan Update  12/28/2016 Time of Session: [place] MRN: [id number]  Principal Diagnosis: Severe recurrent major depression without psychotic features [facility])  Secondary Diagnoses: Principal Problem:   Severe recurrent major depression without psychotic features ([place]) Active Problems:   COPD (chronic obstructive pulmonary disease) (HCC)   GERD (gastroesophageal reflux disease)   Coronary artery disease   Alcohol use disorder, moderate, dependence (HCC)   Cocaine use disorder, moderate, dependence ([place])   Cannabis use disorder, moderate, dependence (HCC)   Overdose of medication, intentional self-harm, subsequent encounter   Tobacco use disorder   Current Medications:  Current Facility-Administered Medications  Medication Dose Route Frequency Provider Last Rate Last Dose  . acetaminophen (TYLENOL) tablet 650 mg  650 mg Oral Q6H PRN Clapacs, Madie Reno, MD   650 mg at 12/28/16 1214  . albuterol (PROVENTIL HFA;VENTOLIN HFA) 108 (90 Base) MCG/ACT inhaler 2 puff  2 puff Inhalation Q4H PRN Clapacs, John T, MD      . alum & mag hydroxide-simeth (MAALOX/MYLANTA) 200-200-20 MG/5ML suspension 30 mL  30 mL Oral Q4H PRN Clapacs, John T, MD      . aspirin EC tablet 81 mg  81 mg Oral Daily Clapacs, John T, MD   81 mg at 12/28/16 0800  . atorvastatin (LIPITOR) tablet 40 mg  40 mg Oral q1800 Clapacs, Madie Reno, MD   40 mg at 12/27/16 1855  . chlordiazePOXIDE (LIBRIUM) capsule 25 mg  25 mg Oral QID Pucilowska, Jolanta B, MD   25 mg at 12/28/16 1213  . isosorbide mononitrate (IMDUR) 24 hr tablet 30 mg  30 mg Oral Daily Clapacs, John T, MD   30 mg at 12/28/16 0800  . magnesium hydroxide (MILK OF MAGNESIA) suspension 30 mL  30 mL Oral Daily PRN Clapacs, John T, MD      . mometasone-formoterol (DULERA) 200-5 MCG/ACT inhaler 2 puff  2 puff Inhalation BID Clapacs, Madie Reno, MD   2 puff at 12/28/16 1039  . nitroGLYCERIN (NITROSTAT) SL tablet 0.4 mg  0.4 mg Sublingual  Q5 min PRN Clapacs, John T, MD      . pantoprazole (PROTONIX) EC tablet 40 mg  40 mg Oral QAC breakfast Clapacs, Madie Reno, MD   40 mg at 12/28/16 0801  . tamsulosin (FLOMAX) capsule 0.4 mg  0.4 mg Oral QPC supper Clapacs, Madie Reno, MD   0.4 mg at 12/27/16 1855  . ticagrelor (BRILINTA) tablet 90 mg  90 mg Oral BID Clapacs, Madie Reno, MD   90 mg at 12/28/16 0801  . traZODone (DESYREL) tablet 100 mg  100 mg Oral QHS PRN Clapacs, Madie Reno, MD   100 mg at 12/27/16 2157   PTA Medications: Prescriptions Prior to Admission  Medication Sig Dispense Refill Last Dose  . acetaminophen (TYLENOL) 500 MG tablet Take 1,000 mg by mouth every 6 (six) hours as needed. For pain.   PRN at PRN  . albuterol (PROAIR HFA) 108 (90 BASE) MCG/ACT inhaler Inhale 2 puffs into the lungs every 6 (six) hours as needed for wheezing or shortness of breath.    PRN at PRN  . aspirin EC 81 MG tablet Take 81 mg by mouth daily.   Past Week at Unknown time  . atorvastatin (LIPITOR) 40 MG tablet Take 1 tablet (40 mg total) by mouth daily at 6 PM. 30 tablet 0 Past Week at Unknown time  . buPROPion (WELLBUTRIN SR) 100 MG 12 hr tablet TAKE 1 TABLET(S) BY MOUTH DAILY  3  Past Week at Unknown time  . busPIRone (BUSPAR) 10 MG tablet Take 10 mg by mouth 2 (two) times daily.   Not Taking at Unknown time  . cyclobenzaprine (FLEXERIL) 10 MG tablet Take 10 mg by mouth at bedtime.   Not Taking at Unknown time  . divalproex (DEPAKOTE) 500 MG DR tablet Take 500 mg by mouth 2 (two) times daily.   Not Taking at Unknown time  . DULERA 200-5 MCG/ACT AERO TAKE 2 PUFFS BY MOUTH TWICE A DAY  5 Past Week at Unknown time  . Fluticasone-Salmeterol (ADVAIR DISKUS) 250-50 MCG/DOSE AEPB Inhale 1 puff into the lungs 2 (two) times daily.   Not Taking at Unknown time  . isosorbide mononitrate (IMDUR) 30 MG 24 hr tablet Take 30 mg by mouth daily.   Past Week at Unknown time  . nitroGLYCERIN (NITROSTAT) 0.4 MG SL tablet Place 1 tablet (0.4 mg total) under the tongue every 5  (five) minutes x 3 doses as needed for chest pain. 30 tablet 0 PRN at PRN  . omeprazole (PRILOSEC) 20 MG capsule Take 20 mg by mouth daily.   Past Week at Unknown time  . Omeprazole 20 MG TBEC Take 2 tablets by mouth daily.  2 Past Week at Unknown time  . oxyCODONE (OXY IR/ROXICODONE) 5 MG immediate release tablet TAKE 1 TABLET BY MOUTH 3 TIMES A DAY AS NEEDED FOR PAIN  0 PRN at PRN  . oxyCODONE-acetaminophen (ROXICET) 5-325 MG tablet Take 1-2 tablets by mouth every 6 (six) hours as needed for severe pain. (Patient not taking: Reported on 11/01/2016) 15 tablet 0 Not Taking at Unknown time  . tamsulosin (FLOMAX) 0.4 MG CAPS capsule Take 1 capsule (0.4 mg total) by mouth daily. 30 capsule 11 Past Week at Unknown time  . ticagrelor (BRILINTA) 90 MG TABS tablet Take 1 tablet (90 mg total) by mouth 2 (two) times daily. (Patient not taking: Reported on 12/27/2016) 60 tablet 0 Not Taking at Unknown time    Patient Stressors: Financial difficulties Health problems Marital or family conflict  Patient Strengths: Ability for insight Average or above average intelligence Communication skills General fund of knowledge Motivation for treatment/growth  Treatment Modalities: Medication Management, Group therapy, Case management,  1 to 1 session with clinician, Psychoeducation, Recreational therapy.   Physician Treatment Plan for Primary Diagnosis: Severe recurrent major depression without psychotic features ([place]) Long Term Goal(s):     Short Term Goals:    Medication Management: Evaluate patient's response, side effects, and tolerance of medication regimen.  Therapeutic Interventions: 1 to 1 sessions, Unit Group sessions and Medication administration.  Evaluation of Outcomes: Not Met  Physician Treatment Plan for Secondary Diagnosis: Principal Problem:   Severe recurrent major depression without psychotic features ([place]) Active Problems:   COPD (chronic obstructive pulmonary disease) (HCC)   GERD  (gastroesophageal reflux disease)   Coronary artery disease   Alcohol use disorder, moderate, dependence (HCC)   Cocaine use disorder, moderate, dependence (HCC)   Cannabis use disorder, moderate, dependence (HCC)   Overdose of medication, intentional self-harm, subsequent encounter   Tobacco use disorder  Long Term Goal(s):     Short Term Goals:       Medication Management: Evaluate patient's response, side effects, and tolerance of medication regimen.  Therapeutic Interventions: 1 to 1 sessions, Unit Group sessions and Medication administration.  Evaluation of Outcomes: Not Met   RN Treatment Plan for Primary Diagnosis: Severe recurrent major depression without psychotic features ([place]) Long Term Goal(s): Knowledge of disease  and therapeutic regimen to maintain health will improve  Short Term Goals: Ability to verbalize feelings will improve, Ability to disclose and discuss suicidal ideas, Ability to identify and develop effective coping behaviors will improve and Compliance with prescribed medications will improve  Medication Management: RN will administer medications as ordered by provider, will assess and evaluate patient's response and provide education to patient for prescribed medication. RN will report any adverse and/or side effects to prescribing provider.  Therapeutic Interventions: 1 on 1 counseling sessions, Psychoeducation, Medication administration, Evaluate responses to treatment, Monitor vital signs and CBGs as ordered, Perform/monitor CIWA, COWS, AIMS and Fall Risk screenings as ordered, Perform wound care treatments as ordered.  Evaluation of Outcomes: Not Met   LCSW Treatment Plan for Primary Diagnosis: Severe recurrent major depression without psychotic features ([place]) Long Term Goal(s): Safe transition to appropriate next level of care at discharge, Engage patient in therapeutic group addressing interpersonal concerns.  Short Term Goals: Engage patient in  aftercare planning with referrals and resources, Facilitate patient progression through stages of change regarding substance use diagnoses and concerns and Increase skills for wellness and recovery  Therapeutic Interventions: Assess for all discharge needs, 1 to 1 time with Social worker, Explore available resources and support systems, Assess for adequacy in community support network, Educate family and significant other(s) on suicide prevention, Complete Psychosocial Assessment, Interpersonal group therapy.  Evaluation of Outcomes: Not Met    Recreational Therapy Treatment Plan for Primary Diagnosis: Severe recurrent major depression without psychotic features ([place]) Long Term Goal(s): Patient will participate in recreation therapy treatment in at least 2 group sessions without prompting from LRT  Short Term Goals: Increase stress management skills, Increase healthy coping skills  Treatment Modalities: Group Therapy and Individual Treatment Sessions  Therapeutic Interventions: Psychoeducation  Evaluation of Outcomes: Progressing   Progress in Treatment: Attending groups: No. Participating in groups: No. Taking medication as prescribed: Yes. Toleration medication: Yes. Family/Significant other contact made: No, will contact:  girlfriend Patient understands diagnosis: Yes. Discussing patient identified problems/goals with staff: Yes. Medical problems stabilized or resolved: Yes. Denies suicidal/homicidal ideation: Yes. Issues/concerns per patient self-inventory: No. Other: none  New problem(s) identified: No, Describe:  none  New Short Term/Long Term Goal(s):Pt goal: "I can't cope.  Everything is getting worse and worse." Pt agrees to work on stopping alcohol use as first step.  Discharge Plan or Barriers: CSW assessing for appropriate poan.  Reason for Continuation of Hospitalization: Depression Medication stabilization  Estimated Length of Stay:3-5  days.  Attendees: Patient: Keith Gibbs 12/28/2016   Physician: Dr. Bary Leriche, MD 12/28/2016   Nursing: Elige Radon, RN 12/28/2016   RN Care Manager: 12/28/2016   Social Worker: Lurline Idol, LCSW 12/28/2016   Recreational Therapist: Drue Flirt, LRT/CTRS  12/28/2016   Other:  12/28/2016   Other:  12/28/2016   Other: 12/28/2016        Scribe for Treatment Team: Joanne Chars, [place] 12/28/2016 12:45 PM

## 2016-12-28 NOTE — Progress Notes (Signed)
NUTRITION ASSESSMENT  Pt identified as at risk on the Malnutrition Screen Tool  INTERVENTION: 1. Educated patient on the importance of nutrition and encouraged intake of food and beverages. 2. Discussed weight goals. 3. Supplements: Ensure Enlive po BID, each supplement provides 350 kcal and 20 grams of protein  NUTRITION DIAGNOSIS: Unintentional weight loss related to sub-optimal intake as evidenced by pt report.   Goal: Pt to meet >/= 90% of their estimated nutrition needs.  Monitor:  PO intake  Assessment:  56 y.o. male with PMHx of anxiety, depression, COPD, EtOH abuse, substance abuse admitted with suicidal ideation, MDD.  Patient reports he has had a poor appetite for a while now. He is experiencing abdominal pain, nausea, and diarrhea. He was supposed to have EGD and colonoscopy but reports he has cancelled the procedures two times now. Also reports his daughter recently died, which is very stressful. Patient reports lately he is only eating one meal per day. He may have a bowl of cereal or 1/2 sandwich and then later will have a snack of ice cream. Reports his UBW was 300 lbs. He reports weight loss of approximately 100 lbs (33% body weight) over unknown time period. Per chart patient was around 250 lbs in 2016 - did not see a weight as high as 300 lbs. Recently patient has lost 8.6 lbs (4.3% weight loss) over 2 months, which is not significant for time frame.  Meal Completion: 100% Patient reports he is eating well here.   Height: Ht Readings from Last 1 Encounters:  12/27/16 6\' 3"  (1.905 m)    Weight: Wt Readings from Last 1 Encounters:  12/27/16 193 lb (87.5 kg)    Weight Hx: Wt Readings from Last 10 Encounters:  12/27/16 193 lb (87.5 kg)  12/26/16 196 lb (88.9 kg)  11/01/16 201 lb 9.6 oz (91.4 kg)  09/25/16 197 lb (89.4 kg)  09/14/16 194 lb (88 kg)  09/06/16 197 lb 8 oz (89.6 kg)  08/22/16 198 lb 4.8 oz (89.9 kg)  07/06/16 197 lb 14.4 oz (89.8 kg)  06/19/16  198 lb (89.8 kg)  06/10/16 200 lb (90.7 kg)    BMI:  Body mass index is 24.12 kg/m. Pt meets criteria for normal weight based on current BMI.  Estimated Nutritional Needs: Kcal: 25-30 kcal/kg Protein: > 1 gram protein/kg Fluid: 1 ml/kcal  Diet Order: Diet regular Room service appropriate? No; Fluid consistency: Thin Pt is also offered choice of unit snacks mid-morning and mid-afternoon.  Pt is eating as desired.   Lab results and medications reviewed.   Willey Blade, MS, RD, LDN Pager: [phone] After Hours Pager: [phone]

## 2016-12-28 NOTE — Progress Notes (Signed)
  D: Patient stated slept fairlast night .Stated appetite fair and energy level low. Stated concentration poor. Stated on Depression scale 10 , hopeless 10 and anxiety 10 .( low 0-10 high) Denies suicidal  homicidal ideations  .  No auditory hallucinations  No pain concerns . Appropriate ADL'S. Interacting with peers and staff.  A: Encourage patient participation with unit programming . Instruction  Given on  Medication , verbalize understanding. R: Voice no other concerns. Staff continue to monitor

## 2016-12-28 NOTE — Plan of Care (Signed)
Problem: Activity: Goal: Sleeping patterns will improve Outcome: Progressing Patient slept for Estimated Hours of 6.30; every 15 minutes safety round maintained, no injury or falls during this shift.    

## 2016-12-28 NOTE — [facility] Group Notes (Signed)
[facility] Group Notes:  (Nursing/MHT/Case Management/Adjunct)  Date:  12/28/2016  Time:  11:32 PM  Type of Therapy:  Group Therapy  Participation Level:  Active  Participation Quality:  Appropriate  Affect:  Appropriate  Cognitive:  Appropriate  Insight:  Appropriate  Engagement in Group:  Engaged  Modes of Intervention:  Discussion  Summary of Progress/Problems:  Keith Gibbs 12/28/2016, 11:32 PM
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
## 2016-12-28 NOTE — Progress Notes (Signed)
Recreation Therapy Notes  Date: 06.06.18 Time: 9:30 am Location: Craft Room  Group Topic: Self-esteem  Goal Area(s) Addresses: Patient will write at least one positive trait about self. Patient will verbalize benefit of having a healthy self-esteem.   Behavioral Response: Attentive, Interactive  Intervention: I Am   Activity: Patients were given a worksheet with the letter I on it and were instructed to write as many positive traits about themselves inside the letter.  Education: LRT educated patients on ways to increase their self-esteem.   Education Outcome: Acknowledges education/In group clarification offered  Clinical Observations/Feedback: Patient wrote positive traits about self. Patient contributed to group discussion by stating it was difficult to think of positive traits and what he could do to increase his self-esteem.  Leonette Monarch, LRT/CTRS 12/28/2016 3:32 PM
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
## 2016-12-28 NOTE — Progress Notes (Signed)
[place] received OR for a Pt with suicidal thoughts. [facility] visited the Pt at 1:20pm but Pt was in groups. CH met with Pt at 4:00pm and talked briefly with Pt. [place] have a plan to have an extended visit with the Pt tomorrow morning.     12/28/16 1600  Clinical Encounter Type  Visited With Patient  Visit Type Initial;Psychological support  Referral From Nurse  Consult/Referral To Chaplain  Spiritual Encounters  Spiritual Needs Emotional;Other (Comment)

## 2016-12-28 NOTE — Progress Notes (Signed)
Patient ID: Keith Gibbs, male   DOB: 1961-05-16, 56 y.o.   MRN: [id number] Poorly kept skin conditions and appearance; cuts and bruises noted; c/o 8/10 generalized pain, Tylenol 650 mg is the only pain medication, offered and declined by patient, "I take Roxicodone, Tylenol will not do me no good..." Trazodone 100 mg given at bedtime for insomnia. Evasive, not talking much about what brought him here; neither deny nor endorse SI/SIB/HI/AVH; room remains very close to the nurses' station for close monitoring.

## 2016-12-28 NOTE — BHH Suicide Risk Assessment (Signed)
[facility] Admission Suicide Risk Assessment   Nursing information obtained from:  Patient  Demographic factors:    Current Mental Status:    Loss Factors:    Historical Factors:    Risk Reduction Factors:     Total Time spent with patient: 1 hour Principal Problem: Severe recurrent major depression without psychotic features [facility]) Diagnosis:   Patient Active Problem List   Diagnosis Date Noted  . Overdose of medication, intentional self-harm, subsequent encounter [T50.902D] 12/28/2016  . Tobacco use disorder [F17.200] 12/28/2016  . Severe recurrent major depression without psychotic features ([place]) [F33.2] 12/27/2016  . Coronary artery disease [I25.10] 12/27/2016  . Alcohol use disorder, moderate, dependence ([place]) [F10.20] 12/27/2016  . Cocaine use disorder, moderate, dependence ([place]) [F14.20] 12/27/2016  . Cannabis use disorder, moderate, dependence ([place]) [F12.20] 12/27/2016  . Angina at [facility]) [I20.8] 06/11/2016  . Chest pain [R07.9] 06/10/2016  . Bipolar 1 disorder ([place]) [F31.9] 05/27/2013  . Myalgia and myositis [IMO0001] 05/27/2013  . Abnormal ejaculation [N53.19] 11/30/2012  . Chest pain, unspecified [R07.9] 11/30/2012  . Degenerative arthritis of hip [M16.9] 11/16/2012  . Hip pain, left [M25.552] 08/29/2012  . Swollen testicle [N50.89] 07/27/2012  . Schizophrenia ([place]) [F20.9] 06/27/2012  . Osteoarthritis [M19.90] 06/27/2012  . Shoulder pain [M25.519] 06/27/2012  . COPD (chronic obstructive pulmonary disease) ([place]) [J44.9] 06/27/2012  . Chronic hepatitis C ([place]) [B18.2] 06/27/2012  . GERD (gastroesophageal reflux disease) [K21.9] 06/27/2012  . Complete rupture of rotator cuff [M75.120] 02/10/2012  . Cocaine abuse in remission [F14.11] 12/05/2011   Subjective Data: overdose.  Continued Clinical Symptoms:  Alcohol Use Disorder Identification Test Final Score (AUDIT): 32 The "Alcohol Use Disorders Identification Test", Guidelines for Use in Primary Care, Second Edition.   World Pharmacologist [facility]). Score between 0-7:  no or low risk or alcohol related problems. Score between 8-15:  moderate risk of alcohol related problems. Score between 16-19:  high risk of alcohol related problems. Score 20 or above:  warrants further diagnostic evaluation for alcohol dependence and treatment.   CLINICAL FACTORS:   Severe Anxiety and/or Agitation Depression:   Comorbid alcohol abuse/dependence Impulsivity Insomnia Alcohol/Substance Abuse/Dependencies Obsessive-Compulsive Disorder Unstable or Poor Therapeutic Relationship Previous Psychiatric Diagnoses and Treatments   Musculoskeletal: Strength & Muscle Tone: within normal limits Gait & Station: normal Patient leans: N/A  Psychiatric Specialty Exam: Physical Exam  Nursing note and vitals reviewed. Psychiatric: His speech is normal. His mood appears anxious. He is withdrawn. Cognition and memory are normal. He expresses impulsivity. He exhibits a depressed mood. He expresses suicidal ideation. He expresses suicidal plans.    Review of Systems  Musculoskeletal: Positive for back pain and joint pain.  Neurological: Positive for tremors.  Psychiatric/Behavioral: Positive for depression, substance abuse and suicidal ideas. The patient is nervous/anxious and has insomnia.   All other systems reviewed and are negative.   Blood pressure 97/62, pulse (!) 49, temperature 98 F (36.7 C), temperature source Oral, resp. rate 19, height 6\' 3"  (1.905 m), weight 87.5 kg (193 lb), SpO2 99 %.Body mass index is 24.12 kg/m.  General Appearance: Casual  Eye Contact:  Good  Speech:  Clear and Coherent  Volume:  Normal  Mood:  Anxious  Affect:  Congruent  Thought Process:  Goal Directed and Descriptions of Associations: Intact  Orientation:  Full (Time, Place, and Person)  Thought Content:  WDL  Suicidal Thoughts:  Yes.  with intent/plan  Homicidal Thoughts:  No  Memory:  Immediate;   Fair Recent;   Fair Remote;  Fair  Judgement:  Poor  Insight:  Lacking  Psychomotor Activity:  Psychomotor Retardation  Concentration:  Concentration: Fair and Attention Span: Fair  Recall:  [employer] of Knowledge:  Fair  Language:  Fair  Akathisia:  No  Handed:  Right  AIMS (if indicated):     Assets:  Communication Skills Desire for Improvement Financial Resources/Insurance Housing Intimacy Resilience Social Support  ADL's:  Intact  Cognition:  WNL  Sleep:  Number of Hours: 6.3      COGNITIVE FEATURES THAT CONTRIBUTE TO RISK:  None    SUICIDE RISK:   Severe:  Frequent, intense, and enduring suicidal ideation, specific plan, no subjective intent, but some objective markers of intent (i.e., choice of lethal method), the method is accessible, some limited preparatory behavior, evidence of impaired self-control, severe dysphoria/symptomatology, multiple risk factors present, and few if any protective factors, particularly a lack of social support.  PLAN OF CARE: Hospital admission, medication management, substance abuse counseling, discharge planning.  Keith Gibbs is a 56 year old male with a history of psychotic depression and alcoholism admitted after overdose on. In the context of substance use and severe social stressors.  1. Suicidal ideation. The patient is able to contract for safety in the hospital.  2. Mood. We will start Luvox for depression and OCD and Seroquel for mood stabilization.  3. Alcohol abuse. We started lithium taper.  4. Substance abuse treatment. The patient desires to residential treatment. It may not be possible since the patient is on narcotic painkillers and has legal charges pending.  5. Chronic pain. He takes Percocet 5 mg 3 times a day prescribed by primary care physician. We will continue. No prescription for Wellbutrin given.  6. COPD. He is on albuterol and Allegra.  7. Coronary artery disease. He is on aspirin, Imdur, nitroglycerin, and Brilinta.  8. Dyslipidemia.  He is on Lipitor.  9. GERD. He is on Protonix.  10. BPH. He is on Flomax.  11. Insomnia. Ativan is available.  12. Metabolic syndrome monitoring. Lipid panel, TSH, and hemoglobin A1c are pending.  13. EKG. Pending.   14. Disposition. To be established. Hopefully to rehab.  follow up with [facility].   I certify that inpatient services furnished can reasonably be expected to improve the patient's condition.   Orson Slick, MD 12/28/2016, 3:56 PM
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
## 2016-12-28 NOTE — Plan of Care (Signed)
Problem: Activity: Goal: Risk for activity intolerance will decrease Outcome: Progressing Attending unit  Programing  . Periods of slow  Down for  Rest and balance

## 2016-12-28 NOTE — BHH Group Notes (Signed)
[facility] LCSW Group Therapy 12/28/2016 1:15 PM  Type of Therapy: Group Therapy- Emotion Regulation  Participation Level: Active   Participation Quality:  Appropriate  Affect: Appropriate  Cognitive: Alert and Oriented   Insight:  Developing/Improving  Engagement in Therapy: Developing/Improving and Engaged   Modes of Intervention: Clarification, Confrontation, Discussion, Education, Exploration, Limit-setting, Orientation, Problem-solving, [place] Building, Art therapist, Socialization and Support  Summary of Progress/Problems: The topic for group today was emotional regulation. This group focused on both positive and negative emotion identification and allowed group members to process ways to identify feelings, regulate negative emotions, and find healthy ways to manage internal/external emotions. Group members were asked to reflect on a time when their reaction to an emotion led to a negative outcome and explored how alternative responses using emotion regulation would have benefited them. Group members were also asked to discuss a time when emotion regulation was utilized when a negative emotion was experienced. Pt reports being frustrated and feeling slighted by the government after working hard for many years. Pt expressed that his sadness comes primarily from grieving the death of his daughter. He does demonstrate developing insight AEB recognition that his use of alcohol and drugs to self-medicate makes his emotions/situation worse.    Adriana Reams, LCSW 12/28/2016 9:19 AM

## 2016-12-29 LAB — GLUCOSE, CAPILLARY: GLUCOSE-CAPILLARY: 101 mg/dL — AB (ref 65–99)

## 2016-12-29 LAB — HEMOGLOBIN A1C
HEMOGLOBIN A1C: 5.2 % (ref 4.8–5.6)
Mean Plasma Glucose: 103 mg/dL

## 2016-12-29 MED ORDER — PRAZOSIN HCL 1 MG PO CAPS
1.0000 mg | ORAL_CAPSULE | Freq: Every day | ORAL | Status: DC
  Administered 2016-12-29: 1 mg via ORAL
  Filled 2016-12-29: qty 1

## 2016-12-29 MED ORDER — CHLORDIAZEPOXIDE HCL 10 MG PO CAPS
10.0000 mg | ORAL_CAPSULE | Freq: Four times a day (QID) | ORAL | Status: DC
  Administered 2016-12-29 – 2016-12-30 (×3): 10 mg via ORAL
  Filled 2016-12-29 (×3): qty 1

## 2016-12-29 MED ORDER — FLUVOXAMINE MALEATE 50 MG PO TABS
100.0000 mg | ORAL_TABLET | Freq: Every day | ORAL | Status: DC
  Administered 2016-12-29: 100 mg via ORAL
  Filled 2016-12-29: qty 2

## 2016-12-29 NOTE — Progress Notes (Signed)
Referral made to [facility], however, [facility] reports pt cannot take any pain medication in their program and also cannot have any pending legal charges. Winferd Humphrey, MSW, LCSW Clinical Social Worker 12/29/2016 12:32 PM

## 2016-12-29 NOTE — Plan of Care (Signed)
Problem: Coping: Goal: Ability to cope will improve Outcome: Progressing Working  On Scientific laboratory technician , met  With [employer]

## 2016-12-29 NOTE — Progress Notes (Signed)
Patient ID: Keith Gibbs, male   DOB: March 28, 1961, 56 y.o.   MRN: [id number] Mood and affect much better, appearance improved, optimistic, hopeful, satisfied with pain management and new medications addition, A&Ox3, denied SI/SIB/HI/AVH.

## 2016-12-29 NOTE — Progress Notes (Signed)
Chaplain made a follow up visit with the Pt. Pt was in groups this morning on CH's arrival. [facility] met with Pt. Pt stated he was grieving a loss of his daughter, struggles with poor health, and has serious financial problems. Pt said that when he fails to get his way, he often use alcohol and drugs to cope with stress and struggles of life. Pt has a good support system, but he has not been using it. Pt admits he tried to commit suicide, but denies having suicide thoughts this morning. Chaplain recommended Pt to seek help for his alcohol, anger, and drug problems at alcohol and drug program or a therapy place as that could help him to regain his life. Pt agreed that therapy could serve as a long-term solution to his alcohol and drug problem. Pt requested prayers for concerns he shared with [facility], which [place] provided. [place] to have a follow up with Pt to assess Pt's progress.   12/29/16 1200  Clinical Encounter Type  Visited With Patient  Visit Type Follow-up;Psychological support;Spiritual support;Other (Comment)  Referral From Chaplain  Spiritual Encounters  Spiritual Needs Prayer;Emotional;Other (Comment)  Stress Factors  Patient Stress Factors Financial concerns  Family Stress Factors Major life changes

## 2016-12-29 NOTE — Plan of Care (Signed)
Problem: BHH Participation in Recreation Therapeutic Interventions Goal: STG-Patient will identify at least five coping skills for ** STG: Coping Skills - Within 4 treatment sessions, patient will verbalize at least 5 coping skills for substance abuse in each of 2 treatment sessions to decrease substance abuse.  Outcome: Progressing Treatment Session 1; Completed 1 out of 2: At approximately 10:55 am, LRT met with patient in community room. Patient verbalized 5 coping skills for substance abuse. LRT educated patient on leisure and why it is important to implement it into his schedule. LRT educated and provided patient with blank schedules to help him plan his day and try to avoid using substances. LRT educated patient on healthy support systems.   M , LRT/CTRS 06.07.18 2:36 pm Goal: STG-Other Recreation Therapy Goal (Specify) STG: Stress Management - Within 4 treatment sessions, patient will verbalize understanding of the stress management techniques in each of 2 treatment sessions to increase stress management skills.  Outcome: Progressing Treatment Session 1; Completed 1 out of 2: At approximately 10:55 am, LRT met with patient in community room. LRT educated and provided patient with handouts on stress management techniques. Patient verbalized understanding. LRT encouraged patient to read over and practice the stress management techniques.   M , LRT/CTRS 06.07.18 2:38 pm   

## 2016-12-29 NOTE — BHH Suicide Risk Assessment (Signed)
[place] INPATIENT:  Family/Significant Other Suicide Prevention Education  Suicide Prevention Education:  Education Completed; Sherilyn Cooter, girlfriend, [phone], has been identified by the patient as the family member/significant other with whom the patient will be residing, and identified as the person(s) who will aid the patient in the event of a mental health crisis (suicidal ideations/suicide attempt).  With written consent from the patient, the family member/significant other has been provided the following suicide prevention education, prior to the and/or following the discharge of the patient.  The suicide prevention education provided includes the following:  Suicide risk factors  Suicide prevention and interventions  National Suicide Hotline telephone number  [facility] assessment telephone number  [facility] Emergency Assistance [place] and/or Residential Mobile Crisis Unit telephone number  Request made of family/significant other to:  Remove weapons (e.g., guns, rifles, knives), all items previously/currently identified as safety concern.  No guns in the home, per [facility].  Remove drugs/medications (over-the-counter, prescriptions, illicit drugs), all items previously/currently identified as a safety concern. Stanton Kidney will check on medication in the home.  The family member/significant other verbalizes understanding of the suicide prevention education information provided.  The family member/significant other agrees to remove the items of safety concern listed above.  Pt has been under stress, per [facility].  Particularly financial, also has a lot of trouble with his sister.  Her main concern is drug use.  She can assist in getting him to treatment program if he ends up in outpt program.  Joanne Chars, LCSW 12/29/2016, 3:12 PM

## 2016-12-29 NOTE — BHH Group Notes (Signed)
[facility] LCSW Group Therapy  12/29/2016 10:27 AM  Type of Therapy:  Group Therapy  Participation Level:  Minimal  Participation Quality:  Attentive  Affect:  Appropriate  Cognitive:  Alert  Insight:  Limited  Engagement in Therapy:  Limited  Modes of Intervention:  Activity, Discussion, Education, Problem-solving, Art therapist, Socialization and Support  Summary of Progress/Problems: Balance in life: Patients will discuss the concept of balance and how it looks and feels to be unbalanced. Pt will identify areas in their life that is unbalanced and ways to become more balanced. They discussed what aspects in their lives has influenced their self care. Patients also discussed self care in the areas of self regulation/control, hygiene/appearance, sleep/relaxation, healthy leisure, healthy eating habits, exercise, inner peace/spirituality, self improvement, sobriety, and health management. They were challenged to identify changes that are needed in order to improve self care. Patient left group after about 5 minutes to meet with Chaplain. Patient did not return to group.   Lanore Renderos G. Claybon Jabs MSW, [place] 12/29/2016, 10:28 AM

## 2016-12-29 NOTE — [facility] Group Notes (Addendum)
[facility] LCSW Group Therapy Note  Type of Therapy and Topic:  Group Therapy:  Goals Group: SMART Goals  Participation Level:  Patient attended group on this date and minimally participated in the group discussion.   Description of Group:   The purpose of a daily goals group is to assist and guide patients in setting recovery/wellness-related goals.  The objective is to set goals as they relate to the crisis in which they were admitted. Patients will be using SMART goal modalities to set measurable goals.  Characteristics of realistic goals will be discussed and patients will be assisted in setting and processing how one will reach their goal. Facilitator will also assist patients in applying interventions and coping skills learned in psycho-education groups to the SMART goal and process how one will achieve defined goal.  Therapeutic Goals: -Patients will develop and document one goal related to or their crisis in which brought them into treatment. -Patients will be guided by LCSW using SMART goal setting modality in how to set a measurable, attainable, realistic and time sensitive goal.  -Patients will process barriers in reaching goal. -Patients will process interventions in how to overcome and successful in reaching goal.   Summary of Patient Progress:  Patient Goal: "I want to work on medication regimen so that I don't feel so drowsy". CSW encouraged patient to discuss his medication concerns with his MD and nurse.    Therapeutic Modalities:   Motivational Interviewing Public relations account executive Therapy Crisis Intervention Model SMART goals setting  Kyandra Mcclaine G. [place], [place] 12/29/2016  10:06 AM

## 2016-12-29 NOTE — Progress Notes (Signed)
D:  Affect cheerful on approach, voice of feeling better Patient stated slept fair last night .Stated appetite fair and energy level  lowl. Stated concentration is good . Stated on Depression scale 8, hopeless 8 and anxiety 8 .( low 0-10 high) Denies suicidal  homicidal ideations  .  No auditory hallucinations  No pain concerns . Appropriate ADL'S. Interacting with peers and staff.  Goal for today " Listen and Learn" work on Radiographer, therapeutic . A: Encourage patient participation with unit programming . Instruction  Given on  Medication , verbalize understanding. R: Voice no other concerns. Staff continue to monitor

## 2016-12-29 NOTE — Plan of Care (Signed)
Problem: Activity: Goal: Sleeping patterns will improve Outcome: Progressing Patient slept for Estimated Hours of 7; q15 minutes safety round maintained, no injury or falls during this shift.

## 2016-12-29 NOTE — Progress Notes (Signed)
Recreation Therapy Notes  Date: 06.07.18 Time: m Location: Craft Room  Group Topic: Leisure Education  Goal Area(s) Addresses:  Patient will identify activities for each letter of the alphabet. Patient will verbalize ability to integrate positive leisure into life post d/c. Patient will verbalize ability to use leisure as a Technical sales engineer.  Behavioral Response: Attentive, Interactive  Intervention: Leisure Alphabet  Activity: Patients were given a Leisure Air traffic controller and were instructed to write healthy leisure activities for each letter of the alphabet.  Education: LRT educated patients on what they need to participate in leisure.  Education Outcome: Acknowledges education/In group clarification offered   Clinical Observations/Feedback: Patient wrote healthy leisure activities. Patient contributed to group discussion by stating healthy leisure activities.  Leonette Monarch, LRT/CTRS 12/29/2016 1:50 PM

## 2016-12-29 NOTE — Progress Notes (Signed)
[facility] MD Progress Note  12/29/2016 12:40 PM Keith Gibbs  MRN:  [id number]  Subjective:   12/29/2016. Keith Gibbs feels a little better today.  he slept well last night but complains of PTSD related nightmares. He tolerates medications well. Vital signs are stable and there are no symptoms of alcohol withdrawal. He is on the Librium taper. He receives ensure as he reported weight loss. Appetite is okay. Good program participation. The patient was referred to [facility] substance abuse rehabilitation. I worry that he may not be accepted there as he is on scheduled Percocet and has legal charges pending.   Per nursing: Mood and affect much better, appearance improved, optimistic, hopeful, satisfied with pain management and new medications addition, A&Ox3, denied SI/SIB/HI/AVH.  Principal Problem: Severe recurrent major depression without psychotic features ([place]) Diagnosis:   Patient Active Problem List   Diagnosis Date Noted  . Overdose of medication, intentional self-harm, subsequent encounter [T50.902D] 12/28/2016  . Tobacco use disorder [F17.200] 12/28/2016  . Severe recurrent major depression without psychotic features ([place]) [F33.2] 12/27/2016  . Coronary artery disease [I25.10] 12/27/2016  . Alcohol use disorder, moderate, dependence ([place]) [F10.20] 12/27/2016  . Cocaine use disorder, moderate, dependence ([place]) [F14.20] 12/27/2016  . Cannabis use disorder, moderate, dependence ([place]) [F12.20] 12/27/2016  . Angina at rest [facility]) [I20.8] 06/11/2016  . Chest pain [R07.9] 06/10/2016  . Bipolar 1 disorder ([place]) [F31.9] 05/27/2013  . Myalgia and myositis [IMO0001] 05/27/2013  . Abnormal ejaculation [N53.19] 11/30/2012  . Chest pain, unspecified [R07.9] 11/30/2012  . Degenerative arthritis of hip [M16.9] 11/16/2012  . Hip pain, left [M25.552] 08/29/2012  . Swollen testicle [N50.89] 07/27/2012  . Schizophrenia ([place]) [F20.9] 06/27/2012  . Osteoarthritis [M19.90] 06/27/2012  . Shoulder pain [M25.519]  06/27/2012  . COPD (chronic obstructive pulmonary disease) ([place]) [J44.9] 06/27/2012  . Chronic hepatitis C ([place]) [B18.2] 06/27/2012  . GERD (gastroesophageal reflux disease) [K21.9] 06/27/2012  . Complete rupture of rotator cuff [M75.120] 02/10/2012  . Cocaine abuse in remission [F14.11] 12/05/2011   Total Time spent with patient: 30 minutes  Past Psychiatric History: depression, alcoholism.  Past Medical History:  Past Medical History:  Diagnosis Date  . Anxiety   . Arthritis   . Bursitis   . COPD (chronic obstructive pulmonary disease) ([place])   . Depression   . Hepatitis C 2012   No longer has Hep C    Past Surgical History:  Procedure Laterality Date  . ABDOMINAL SURGERY     Intestines  . APPENDECTOMY    . BACK SURGERY    . CARDIAC CATHETERIZATION Left 06/10/2016   Procedure: Left Heart Cath and Coronary Angiography;  Surgeon: Dionisio David, MD;  Location: [place] [facility];  Service: Cardiovascular;  Laterality: Left;  . CARDIAC CATHETERIZATION N/A 06/10/2016   Procedure: Coronary Stent Intervention;  Surgeon: Yolonda Kida, MD;  Location: [place] [facility];  Service: Cardiovascular;  Laterality: N/A;  . COLONOSCOPY    . SHOULDER SURGERY  04/09/2012   Family History:  Family History  Problem Relation Age of Onset  . Osteoarthritis Mother   . Heart disease Mother   . Hypertension Mother   . Heart failure Father   . Heart disease Father   . Early death Father   . Hypertension Father   . Hypertension Sister   . Prostate cancer Neg Hx   . Bladder Cancer Neg Hx   . Kidney cancer Neg Hx    Family Psychiatric  History: see H&P. Social History:  History  Alcohol Use No     History  Drug Use No    Social History   Social History  . Marital status: Single    Spouse name: N/A  . Number of children: N/A  . Years of education: N/A   Social History Main Topics  . Smoking status: Current Every Day Smoker    Packs/day: 0.50    Types: Cigarettes     Last attempt to quit: 03/04/2012  . Smokeless tobacco: Never Used  . Alcohol use No  . Drug use: No  . Sexual activity: Not Asked   Other Topics Concern  . None   Social History Narrative  . None   Additional Social History:                         Sleep: Poor  Appetite:  Poor  Current Medications: Current Facility-Administered Medications  Medication Dose Route Frequency Provider Last Rate Last Dose  . acetaminophen (TYLENOL) tablet 650 mg  650 mg Oral Q6H PRN Clapacs, Madie Reno, MD   650 mg at 12/28/16 1214  . albuterol (PROVENTIL HFA;VENTOLIN HFA) 108 (90 Base) MCG/ACT inhaler 2 puff  2 puff Inhalation Q4H PRN Clapacs, John T, MD      . alum & mag hydroxide-simeth (MAALOX/MYLANTA) 200-200-20 MG/5ML suspension 30 mL  30 mL Oral Q4H PRN Clapacs, John T, MD      . aspirin EC tablet 81 mg  81 mg Oral Daily Clapacs, Madie Reno, MD   81 mg at 12/29/16 9563  . atorvastatin (LIPITOR) tablet 40 mg  40 mg Oral q1800 Clapacs, Madie Reno, MD   40 mg at 12/28/16 1726  . chlordiazePOXIDE (LIBRIUM) capsule 25 mg  25 mg Oral QID Lotus Gover B, MD   25 mg at 12/29/16 1155  . feeding supplement (ENSURE ENLIVE) (ENSURE ENLIVE) liquid 237 mL  237 mL Oral BID BM Brandee Markin B, MD   237 mL at 12/29/16 1025  . fluvoxaMINE (LUVOX) tablet 100 mg  100 mg Oral QHS Ashantae Pangallo B, MD      . isosorbide mononitrate (IMDUR) 24 hr tablet 30 mg  30 mg Oral Daily Clapacs, Madie Reno, MD   30 mg at 12/29/16 8756  . magnesium hydroxide (MILK OF MAGNESIA) suspension 30 mL  30 mL Oral Daily PRN Clapacs, John T, MD      . mometasone-formoterol (DULERA) 200-5 MCG/ACT inhaler 2 puff  2 puff Inhalation BID Clapacs, Madie Reno, MD   2 puff at 12/29/16 [id number]  . nitroGLYCERIN (NITROSTAT) SL tablet 0.4 mg  0.4 mg Sublingual Q5 min PRN Clapacs, John T, MD      . oxyCODONE-acetaminophen (PERCOCET/ROXICET) 5-325 MG per tablet 1 tablet  1 tablet Oral Q8H PRN Jae Bruck B, MD   1 tablet at 12/29/16 0635  .  pantoprazole (PROTONIX) EC tablet 40 mg  40 mg Oral QAC breakfast Clapacs, Madie Reno, MD   40 mg at 12/29/16 9518  . prazosin (MINIPRESS) capsule 1 mg  1 mg Oral QHS Jahmarion Popoff B, MD      . QUEtiapine (SEROQUEL) tablet 100 mg  100 mg Oral QHS Alejandro Adcox B, MD   100 mg at 12/28/16 2139  . tamsulosin (FLOMAX) capsule 0.4 mg  0.4 mg Oral QPC supper Clapacs, John T, MD   0.4 mg at 12/28/16 1726  . ticagrelor (BRILINTA) tablet 90 mg  90 mg Oral BID Clapacs, Madie Reno, MD   90 mg at 12/29/16  0823    Lab Results:  Results for orders placed or performed during the hospital encounter of 12/27/16 (from the past 48 hour(s))  Hemoglobin A1c     Status: None   Collection Time: 12/28/16  6:51 AM  Result Value Ref Range   Hgb A1c MFr Bld 5.2 4.8 - 5.6 %    Comment: (NOTE)         Pre-diabetes: 5.7 - 6.4         Diabetes: >6.4         Glycemic control for adults with diabetes: <7.0    Mean Plasma Glucose 103 mg/dL    Comment: (NOTE) Performed At: [facility] [place], [place] [id number] Lindon Romp MD [id number]   Lipid panel     Status: None   Collection Time: 12/28/16  6:51 AM  Result Value Ref Range   Cholesterol 81 0 - 200 mg/dL   Triglycerides 58 <150 mg/dL   HDL 47 >40 mg/dL   Total CHOL/HDL Ratio 1.7 RATIO   VLDL 12 0 - 40 mg/dL   LDL Cholesterol 22 0 - 99 mg/dL    Comment:        Total Cholesterol/HDL:CHD Risk Coronary Heart Disease Risk Table                     Men   Women  1/2 Average Risk   3.4   3.3  Average Risk       5.0   4.4  2 X Average Risk   9.6   7.1  3 X Average Risk  23.4   11.0        Use the calculated Patient Ratio above and the CHD Risk Table to determine the patient's CHD Risk.        ATP III CLASSIFICATION (LDL):  <100     mg/dL   Optimal  100-129  mg/dL   Near or Above                    Optimal  130-159  mg/dL   Borderline  160-189  mg/dL   High  >190     mg/dL   Very High   TSH     Status: None    Collection Time: 12/28/16  6:51 AM  Result Value Ref Range   TSH 0.990 0.350 - 4.500 uIU/mL    Comment: Performed by a 3rd Generation assay with a functional sensitivity of <=0.01 uIU/mL.  Glucose, capillary     Status: Abnormal   Collection Time: 12/29/16  6:36 AM  Result Value Ref Range   Glucose-Capillary 101 (H) 65 - 99 mg/dL   Comment 1 Notify RN     Blood Alcohol level:  Lab Results  Component Value Date   ETH 29 (H) 12/26/2016   ETH <5 09/32/6712    Metabolic Disorder Labs: Lab Results  Component Value Date   HGBA1C 5.2 12/28/2016   MPG 103 12/28/2016   No results found for: PROLACTIN Lab Results  Component Value Date   CHOL 81 12/28/2016   TRIG 58 12/28/2016   HDL 47 12/28/2016   CHOLHDL 1.7 12/28/2016   VLDL 12 12/28/2016   LDLCALC 22 12/28/2016   LDLCALC 46 06/11/2016    Physical Findings: AIMS: Facial and Oral Movements Muscles of Facial Expression: None, normal Lips and Perioral Area: None, normal Jaw: None, normal Tongue: None, normal,Extremity Movements Upper (arms, wrists, hands, fingers): Minimal Lower (legs, knees, ankles,  toes): None, normal, Trunk Movements Neck, shoulders, hips: None, normal, Overall Severity Severity of abnormal movements (highest score from questions above): None, normal Incapacitation due to abnormal movements: None, normal Patient's awareness of abnormal movements (rate only patient's report): No Awareness, Dental Status Current problems with teeth and/or dentures?: Yes Does patient usually wear dentures?: Yes  CIWA:  CIWA-Ar Total: 10 COWS:     Musculoskeletal: Strength & Muscle Tone: within normal limits Gait & Station: normal Patient leans: N/A  Psychiatric Specialty Exam: Physical Exam  Nursing note and vitals reviewed. Psychiatric: His speech is normal. His mood appears anxious. He is withdrawn. Cognition and memory are normal. He expresses impulsivity. He exhibits a depressed mood. He expresses suicidal  ideation. He expresses suicidal plans.    Review of Systems  Psychiatric/Behavioral: Positive for depression, substance abuse and suicidal ideas. The patient is nervous/anxious and has insomnia.   All other systems reviewed and are negative.   Blood pressure 107/64, pulse (!) 53, temperature 97.8 F (36.6 C), resp. rate 19, height 6\' 3"  (1.905 m), weight 87.5 kg (193 lb), SpO2 99 %.Body mass index is 24.12 kg/m.  General Appearance: Casual  Eye Contact:  Good  Speech:  Clear and Coherent  Volume:  Normal  Mood:  Anxious and Depressed  Affect:  Blunt  Thought Process:  Goal Directed and Descriptions of Associations: Intact  Orientation:  Full (Time, Place, and Person)  Thought Content:  WDL  Suicidal Thoughts:  Yes.  with intent/plan  Homicidal Thoughts:  No  Memory:  Immediate;   Fair Recent;   Fair Remote;   Fair  Judgement:  Poor  Insight:  Lacking  Psychomotor Activity:  Psychomotor Retardation  Concentration:  Concentration: Fair and Attention Span: Fair  Recall:  [employer] of Knowledge:  Fair  Language:  Fair  Akathisia:  No  Handed:  Right  AIMS (if indicated):     Assets:  Communication Skills Desire for Improvement Housing Intimacy Resilience Social Support  ADL's:  Intact  Cognition:  WNL  Sleep:  Number of Hours: 7     Treatment Plan Summary: Daily contact with patient to assess and evaluate symptoms and progress in treatment and Medication management   Mr. Mogel is a 56 year old male with a history of psychotic depression and alcoholism admitted after overdose on. In the context of substance use and severe social stressors.  1. Suicidal ideation. The patient is able to contract for safety in the hospital.  2. Mood. We will start Luvox for depression and OCD and Seroquel for mood stabilization. I will increase Luvox to 100 mg.   3. Alcohol abuse. We continue lithium taper. Will lower to 10 mg qid.  4. Substance abuse treatment. The patient  desires to residential treatment. It may not be possible since the patient is on narcotic painkillers and has legal charges pending.  5. Chronic pain. He takes Percocet 5 mg 3 times a day prescribed by primary care physician. We will continue. No prescription for Wellbutrin given.  6. COPD. He is on albuterol and Allegra.  7. Coronary artery disease. He is on aspirin, Imdur, nitroglycerin, and Brilinta.  8. Dyslipidemia. He is on Lipitor.  9. GERD. He is on Protonix.  10. BPH. He is on Flomax.  11. Anxiety. We will start minipress 1 mg tonight.   12. Metabolic syndrome monitoring. Lipid panel, TSH, and hemoglobin A1c are pending.  13. EKG. Pending.   14. Disposition. To be established. Hopefully to rehab.  follow up  with [facility].   Orson Slick, MD 12/29/2016, 12:40 PM

## 2016-12-30 MED ORDER — FLUVOXAMINE MALEATE 100 MG PO TABS: 100.0000 mg | ORAL_TABLET | Freq: Every day | ORAL | 1 refills | Status: DC

## 2016-12-30 MED ORDER — OMEPRAZOLE 20 MG PO CPDR: 20.0000 mg | DELAYED_RELEASE_CAPSULE | Freq: Every day | ORAL | 1 refills | Status: DC

## 2016-12-30 MED ORDER — TICAGRELOR 90 MG PO TABS: 90.0000 mg | ORAL_TABLET | Freq: Two times a day (BID) | ORAL | 0 refills | Status: DC

## 2016-12-30 MED ORDER — PRAZOSIN HCL 1 MG PO CAPS: 1.0000 mg | ORAL_CAPSULE | Freq: Every day | ORAL | 1 refills | Status: DC

## 2016-12-30 MED ORDER — ISOSORBIDE MONONITRATE ER 30 MG PO TB24: 30.0000 mg | ORAL_TABLET | Freq: Every day | ORAL | 1 refills | Status: DC

## 2016-12-30 MED ORDER — QUETIAPINE FUMARATE 200 MG PO TABS: 200.0000 mg | ORAL_TABLET | Freq: Every day | ORAL | Status: DC

## 2016-12-30 MED ORDER — NITROGLYCERIN 0.4 MG SL SUBL: 0.4000 mg | SUBLINGUAL_TABLET | SUBLINGUAL | 0 refills | Status: DC | PRN

## 2016-12-30 MED ORDER — ALBUTEROL SULFATE HFA 108 (90 BASE) MCG/ACT IN AERS: 2.0000 | INHALATION_SPRAY | Freq: Four times a day (QID) | RESPIRATORY_TRACT | 1 refills | Status: DC | PRN

## 2016-12-30 MED ORDER — TAMSULOSIN HCL 0.4 MG PO CAPS: 0.4000 mg | ORAL_CAPSULE | Freq: Every day | ORAL | 1 refills | Status: DC

## 2016-12-30 MED ORDER — ATORVASTATIN CALCIUM 40 MG PO TABS: 40.0000 mg | ORAL_TABLET | Freq: Every day | ORAL | 1 refills | Status: DC

## 2016-12-30 MED ORDER — ASPIRIN EC 81 MG PO TBEC: 81.0000 mg | DELAYED_RELEASE_TABLET | Freq: Every day | ORAL | 1 refills | Status: DC

## 2016-12-30 MED ORDER — QUETIAPINE FUMARATE 200 MG PO TABS: 200.0000 mg | ORAL_TABLET | Freq: Every day | ORAL | 1 refills | Status: DC

## 2016-12-30 MED ORDER — ACETAMINOPHEN 500 MG PO TABS: 1000.0000 mg | ORAL_TABLET | Freq: Four times a day (QID) | ORAL | 1 refills | Status: DC | PRN

## 2016-12-30 MED ORDER — DULERA 200-5 MCG/ACT IN AERO: INHALATION_SPRAY | RESPIRATORY_TRACT | 1 refills | Status: DC

## 2016-12-30 NOTE — Progress Notes (Signed)
Patient ID: Keith Gibbs, male   DOB: [birth year], 56 y.o.   MRN: [id number] Active, visible in the milieu, participated in the [place], pleasant but worried about disposition, financial hardships, possible jail time for assault, "my girlfriend is a good girl, the officers said we have been here many times and this time you going to jail, my financed furniture was repossed yesterday, they will come for the TV and the Refrigerator soon, if I go to jail, I will lose my disability check, I am unemployed ......" Patient was allowed to vent. He denied SI/HI/AVH.
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
## 2016-12-30 NOTE — BHH Group Notes (Signed)
[facility] Group Notes:  (Nursing/MHT/Case Management/Adjunct)  Date:  12/30/2016  Time:  12:25 AM  Type of Therapy:  Group Therapy  Participation Level:  Active  Participation Quality:  Appropriate  Affect:  Appropriate  Cognitive:  Appropriate  Insight:  Appropriate    Engagement in Group:  Engaged  Modes of Intervention:  Support  Summary of Progress/Problems:  Keith Gibbs 12/30/2016, 12:25 AM

## 2016-12-30 NOTE — Progress Notes (Signed)
   12/30/16 0945  Clinical Encounter Type  Visited With Patient  Visit Type Spiritual support;Initial;Follow-up  Referral From Chaplain  Spiritual Encounters  Spiritual Needs Prayer;Emotional;Other (Comment)  Stress Factors  Patient Stress Factors Financial concerns   Responded to follow-up consult requests to assist patient with navigating through life changes. I arrived and patient explained his situation and his plan to seek assistance with a drug and alcohol program and financial assistance with his bills. Provided spiritual care, listening, prayer, and a few resources to assist patient with emotional and financial support.   Valentina Lucks, Chaplain

## 2016-12-30 NOTE — Plan of Care (Signed)
Problem: Activity: Goal: Sleeping patterns will improve Outcome: Progressing Patient slept for Estimated Hours of 6.45; q15 minutes safety round maintained, no injury or falls during this shift.

## 2016-12-30 NOTE — Plan of Care (Signed)
Problem: Safety: Goal: Ability to remain free from injury will improve Outcome: Progressing Patient denies suicidal ideations

## 2016-12-30 NOTE — Progress Notes (Signed)
D: Patient is aware of  Discharge this shift .Patient denies suicidal /homicidal ideations. Patient received all belongings brought in  A:Received Storage medications. Writer reviewed Discharge Summary, Suicide Risk Assessment, and Transitional Record. Patient also received Prescriptions   A: Writer instructed on discharge criteria  . Aware  Of follow up appointment . R: Patient left unit with no questions  Or concerns  With family

## 2016-12-30 NOTE — BHH Suicide Risk Assessment (Signed)
[facility] Discharge Suicide Risk Assessment   Principal Problem: Severe recurrent major depression without psychotic features [facility]) Discharge Diagnoses:  Patient Active Problem List   Diagnosis Date Noted  . Overdose of medication, intentional self-harm, subsequent encounter [T50.902D] 12/28/2016  . Tobacco use disorder [F17.200] 12/28/2016  . Severe recurrent major depression without psychotic features ([place]) [F33.2] 12/27/2016  . Coronary artery disease [I25.10] 12/27/2016  . Alcohol use disorder, moderate, dependence ([place]) [F10.20] 12/27/2016  . Cocaine use disorder, moderate, dependence ([place]) [F14.20] 12/27/2016  . Cannabis use disorder, moderate, dependence ([place]) [F12.20] 12/27/2016  . Angina at rest [facility]) [I20.8] 06/11/2016  . Chest pain [R07.9] 06/10/2016  . Bipolar 1 disorder ([place]) [F31.9] 05/27/2013  . Myalgia and myositis [IMO0001] 05/27/2013  . Abnormal ejaculation [N53.19] 11/30/2012  . Chest pain, unspecified [R07.9] 11/30/2012  . Degenerative arthritis of hip [M16.9] 11/16/2012  . Hip pain, left [M25.552] 08/29/2012  . Swollen testicle [N50.89] 07/27/2012  . Schizophrenia ([place]) [F20.9] 06/27/2012  . Osteoarthritis [M19.90] 06/27/2012  . Shoulder pain [M25.519] 06/27/2012  . COPD (chronic obstructive pulmonary disease) ([place]) [J44.9] 06/27/2012  . Chronic hepatitis C ([place]) [B18.2] 06/27/2012  . GERD (gastroesophageal reflux disease) [K21.9] 06/27/2012  . Complete rupture of rotator cuff [M75.120] 02/10/2012  . Cocaine abuse in remission [F14.11] 12/05/2011    Total Time spent with patient: 30 minutes  Musculoskeletal: Strength & Muscle Tone: within normal limits Gait & Station: normal Patient leans: N/A  Psychiatric Specialty Exam: Review of Systems  Psychiatric/Behavioral: Positive for substance abuse.  All other systems reviewed and are negative.   Blood pressure 119/74, pulse (!) 57, temperature 98 F (36.7 C), temperature source Oral, resp. rate 18, height 6\' 3"   (1.905 m), weight 87.5 kg (193 lb), SpO2 99 %.Body mass index is 24.12 kg/m.  General Appearance: Casual  Eye Contact::  Good  Speech:  Clear and Coherent409  Volume:  Normal  Mood:  Euthymic  Affect:  Appropriate  Thought Process:  Goal Directed and Descriptions of Associations: Intact  Orientation:  Full (Time, Place, and Person)  Thought Content:  WDL  Suicidal Thoughts:  No  Homicidal Thoughts:  No  Memory:  Immediate;   Fair Recent;   Fair Remote;   Fair  Judgement:  Impaired  Insight:  Shallow  Psychomotor Activity:  Normal  Concentration:  Fair  Recall:  [place]  Language: Fair  Akathisia:  No  Handed:  Right  AIMS (if indicated):     Assets:  Communication Skills Desire for Improvement Financial Resources/Insurance Housing Intimacy Resilience Social Support  Sleep:  Number of Hours: 6.45  Cognition: WNL  ADL's:  Intact   Mental Status Per Nursing Assessment::   On Admission:     Demographic Factors:  Male, Caucasian and Low socioeconomic status  Loss Factors: Loss of significant relationship, Decline in physical health and Financial problems/change in socioeconomic status  Historical Factors: Prior suicide attempts, Family history of mental illness or substance abuse and Impulsivity  Risk Reduction Factors:   Sense of responsibility to family, Living with another person, especially a relative and Positive social support  Continued Clinical Symptoms:  Depression:   Comorbid alcohol abuse/dependence Impulsivity Alcohol/Substance Abuse/Dependencies Obsessive-Compulsive Disorder  Cognitive Features That Contribute To Risk:  None    Suicide Risk:  Minimal: No identifiable suicidal ideation.  Patients presenting with no risk factors but with morbid ruminations; may be classified as minimal risk based on the severity of the depressive symptoms    Plan Of Care/Follow-up recommendations:  Activity:  As tolerated. Diet:  Low  sodium heart healthy. Other:  Keep follow-up appointments.  Orson Slick, MD 12/30/2016, 11:23 AM

## 2016-12-30 NOTE — Progress Notes (Signed)
  [facility] Adult Case Management Discharge Plan :  Will you be returning to the same living situation after discharge:  Yes,  own home At discharge, do you have transportation home?: Yes,  girlfriend Do you have the ability to pay for your medications: Yes,  medicaid  Release of information consent forms completed and in the chart;  Patient's signature needed at discharge.  Patient to Follow up at: Follow-up Information    [facility] on 01/04/2017.   Why:  Please meet Sherrian Divers at [facility] on Wednesday, 01/04/17, at [facility] for admission to [facility] program.  Please bring copy of your hospital discharge paperwork. Contact information: [place] [address] [phone]           Next level of care provider has access to [place] and Suicide Prevention discussed: Yes,  with girlfriend  Have you used any form of tobacco in the last 30 days? (Cigarettes, Smokeless Tobacco, Cigars, and/or Pipes): Yes  Has patient been referred to the Quitline?: Patient refused referral  Patient has been referred for addiction treatment: Yes  Joanne Chars, [place] 12/30/2016, 3:09 PM

## 2017-01-03 NOTE — Progress Notes (Signed)
Recreation Therapy Notes  INPATIENT RECREATION TR PLAN  Patient Details Name: Keith Gibbs MRN: [id number] DOB: 16-Nov-1960 Today's Date: 01/03/2017  Rec Therapy Plan Is patient appropriate for Therapeutic Recreation?: Yes Treatment times per week: At least once a week TR Treatment/Interventions: 1:1 session, Group participation (Comment) (Appropriate participation in daily recreational therapy tx)  Discharge Criteria Pt will be discharged from therapy if:: Treatment goals are met, Discharged Treatment plan/goals/alternatives discussed and agreed upon by:: Patient/family  Discharge Summary Short term goals set: See Care Plan Short term goals met: Adequate for discharge Progress toward goals comments: One-to-one attended Which groups?: Self-esteem, Leisure education One-to-one attended: Stress management, coping skills Reason goals not met: Patient d/c before goal could be met Therapeutic equipment acquired: None Reason patient discharged from therapy: Discharge from hospital Pt/family agrees with progress & goals achieved: Yes Date patient discharged from therapy: 12/30/16   Leonette Monarch, LRT/CTRS 01/03/2017, 9:02 AM

## 2017-01-04 ENCOUNTER — Other Ambulatory Visit: Payer: Self-pay

## 2017-01-04 DIAGNOSIS — F1410 Cocaine abuse, uncomplicated: Secondary | ICD-10-CM

## 2017-01-05 ENCOUNTER — Ambulatory Visit: Payer: Medicaid Other | Admitting: Certified Registered"

## 2017-01-05 ENCOUNTER — Ambulatory Visit
Admission: RE | Admit: 2017-01-05 | Discharge: 2017-01-05 | Disposition: A | Discharge status: Home / Self Care | Payer: Medicaid Other | Source: Ambulatory Visit | Attending: Gastroenterology | Admitting: Gastroenterology

## 2017-01-05 ENCOUNTER — Encounter
Admission: RE | Disposition: A | Discharge status: Home / Self Care | Payer: Self-pay | Source: Ambulatory Visit | Attending: Gastroenterology

## 2017-01-05 ENCOUNTER — Encounter: Payer: Self-pay | Admitting: *Deleted

## 2017-01-05 DIAGNOSIS — I2510 Atherosclerotic heart disease of native coronary artery without angina pectoris: Secondary | ICD-10-CM | POA: Diagnosis not present

## 2017-01-05 DIAGNOSIS — Z7982 Long term (current) use of aspirin: Secondary | ICD-10-CM | POA: Insufficient documentation

## 2017-01-05 DIAGNOSIS — F17210 Nicotine dependence, cigarettes, uncomplicated: Secondary | ICD-10-CM | POA: Insufficient documentation

## 2017-01-05 DIAGNOSIS — R194 Change in bowel habit: Secondary | ICD-10-CM | POA: Insufficient documentation

## 2017-01-05 DIAGNOSIS — F209 Schizophrenia, unspecified: Secondary | ICD-10-CM | POA: Insufficient documentation

## 2017-01-05 DIAGNOSIS — K5730 Diverticulosis of large intestine without perforation or abscess without bleeding: Secondary | ICD-10-CM | POA: Diagnosis not present

## 2017-01-05 DIAGNOSIS — K640 First degree hemorrhoids: Secondary | ICD-10-CM | POA: Diagnosis not present

## 2017-01-05 DIAGNOSIS — K7460 Unspecified cirrhosis of liver: Secondary | ICD-10-CM

## 2017-01-05 DIAGNOSIS — K635 Polyp of colon: Secondary | ICD-10-CM | POA: Diagnosis not present

## 2017-01-05 DIAGNOSIS — D123 Benign neoplasm of transverse colon: Secondary | ICD-10-CM | POA: Diagnosis not present

## 2017-01-05 DIAGNOSIS — I10 Essential (primary) hypertension: Secondary | ICD-10-CM | POA: Insufficient documentation

## 2017-01-05 DIAGNOSIS — Z79899 Other long term (current) drug therapy: Secondary | ICD-10-CM | POA: Insufficient documentation

## 2017-01-05 DIAGNOSIS — D120 Benign neoplasm of cecum: Secondary | ICD-10-CM

## 2017-01-05 DIAGNOSIS — D122 Benign neoplasm of ascending colon: Secondary | ICD-10-CM | POA: Diagnosis not present

## 2017-01-05 DIAGNOSIS — Z955 Presence of coronary angioplasty implant and graft: Secondary | ICD-10-CM | POA: Diagnosis not present

## 2017-01-05 DIAGNOSIS — Z13810 Encounter for screening for upper gastrointestinal disorder: Secondary | ICD-10-CM | POA: Insufficient documentation

## 2017-01-05 DIAGNOSIS — J449 Chronic obstructive pulmonary disease, unspecified: Secondary | ICD-10-CM | POA: Diagnosis not present

## 2017-01-05 DIAGNOSIS — K219 Gastro-esophageal reflux disease without esophagitis: Secondary | ICD-10-CM | POA: Insufficient documentation

## 2017-01-05 DIAGNOSIS — Z7951 Long term (current) use of inhaled steroids: Secondary | ICD-10-CM | POA: Insufficient documentation

## 2017-01-05 DIAGNOSIS — F319 Bipolar disorder, unspecified: Secondary | ICD-10-CM | POA: Insufficient documentation

## 2017-01-05 HISTORY — PX: COLONOSCOPY WITH PROPOFOL: SHX5780

## 2017-01-05 HISTORY — PX: ESOPHAGOGASTRODUODENOSCOPY (EGD) WITH PROPOFOL: SHX5813

## 2017-01-05 LAB — URINE DRUG SCREEN, QUALITATIVE (ARMC ONLY)
Amphetamines, Ur Screen: NOT DETECTED
BARBITURATES, UR SCREEN: NOT DETECTED
Benzodiazepine, Ur Scrn: POSITIVE — AB
CANNABINOID 50 NG, UR ~~LOC~~: NOT DETECTED
COCAINE METABOLITE, UR ~~LOC~~: NOT DETECTED
MDMA (Ecstasy)Ur Screen: NOT DETECTED
Methadone Scn, Ur: NOT DETECTED
OPIATE, UR SCREEN: NOT DETECTED
PHENCYCLIDINE (PCP) UR S: NOT DETECTED
Tricyclic, Ur Screen: POSITIVE — AB

## 2017-01-05 SURGERY — ESOPHAGOGASTRODUODENOSCOPY (EGD) WITH PROPOFOL
Anesthesia: General

## 2017-01-05 MED ORDER — MIDAZOLAM HCL 2 MG/2ML IJ SOLN
INTRAMUSCULAR | Status: DC | PRN
  Administered 2017-01-05: 1 mg via INTRAVENOUS

## 2017-01-05 MED ORDER — FENTANYL CITRATE (PF) 100 MCG/2ML IJ SOLN
INTRAMUSCULAR | Status: DC | PRN
  Administered 2017-01-05: 50 ug via INTRAVENOUS

## 2017-01-05 MED ORDER — MIDAZOLAM HCL 2 MG/2ML IJ SOLN
INTRAMUSCULAR | Status: AC
  Filled 2017-01-05: qty 2

## 2017-01-05 MED ORDER — PROPOFOL 500 MG/50ML IV EMUL
INTRAVENOUS | Status: AC
  Filled 2017-01-05: qty 50

## 2017-01-05 MED ORDER — GLYCOPYRROLATE 0.2 MG/ML IJ SOLN
INTRAMUSCULAR | Status: DC | PRN
  Administered 2017-01-05: 0.2 mg via INTRAVENOUS

## 2017-01-05 MED ORDER — EPHEDRINE SULFATE 50 MG/ML IJ SOLN
INTRAMUSCULAR | Status: DC | PRN
  Administered 2017-01-05: 15 mg via INTRAVENOUS
  Administered 2017-01-05: 10 mg via INTRAVENOUS

## 2017-01-05 MED ORDER — PHENYLEPHRINE HCL 10 MG/ML IJ SOLN
INTRAMUSCULAR | Status: DC | PRN
  Administered 2017-01-05: 100 ug via INTRAVENOUS

## 2017-01-05 MED ORDER — LIDOCAINE HCL (CARDIAC) 20 MG/ML IV SOLN
INTRAVENOUS | Status: DC | PRN
  Administered 2017-01-05: 50 mg via INTRAVENOUS

## 2017-01-05 MED ORDER — PROPOFOL 10 MG/ML IV BOLUS
INTRAVENOUS | Status: DC | PRN
Start: 2017-01-05 — End: 2017-01-05
  Administered 2017-01-05: 30 mg via INTRAVENOUS
  Administered 2017-01-05: 50 mg via INTRAVENOUS

## 2017-01-05 MED ORDER — SODIUM CHLORIDE 0.9 % IV SOLN
INTRAVENOUS | Status: DC
  Administered 2017-01-05: 1000 mL via INTRAVENOUS

## 2017-01-05 MED ORDER — PROPOFOL 500 MG/50ML IV EMUL
INTRAVENOUS | Status: DC | PRN
  Administered 2017-01-05: 150 ug/kg/min via INTRAVENOUS

## 2017-01-05 NOTE — Anesthesia Postprocedure Evaluation (Signed)
Anesthesia Post Note  Patient: Keith Gibbs  Procedure(s) Performed: Procedure(s) (LRB): ESOPHAGOGASTRODUODENOSCOPY (EGD) WITH PROPOFOL (N/A) COLONOSCOPY WITH PROPOFOL (N/A)  Patient location during evaluation: Endoscopy Anesthesia Type: General Level of consciousness: awake and alert and oriented Pain management: pain level controlled Vital Signs Assessment: post-procedure vital signs reviewed and stable Respiratory status: spontaneous breathing, nonlabored ventilation and respiratory function stable Cardiovascular status: blood pressure returned to baseline and stable Postop Assessment: no signs of nausea or vomiting Anesthetic complications: no     Last Vitals:  Vitals:   01/05/17 0949 01/05/17 0951  BP: (!) 90/45 (!) 101/57  Pulse: (!) 52 (!) 51  Resp: 14 16  Temp: (!) 35.6 C     Last Pain:  Vitals:   01/05/17 0949  TempSrc: Tympanic                 Isaiyah Feldhaus

## 2017-01-05 NOTE — Op Note (Signed)
[facility] Gastroenterology Patient Name: Keith Gibbs Procedure Date: 01/05/2017 9:02 AM MRN: [id number] Account #: [id number] Date of Birth: 08-11-1960 Admit Type: Outpatient Age: 56 Room: [facility] ENDO ROOM 1 Gender: Male Note Status: Finalized Procedure:            Colonoscopy Indications:          Change in bowel habits, Incidental change in bowel                        habits noted Providers:            Jonathon Bellows MD, MD Referring MD:         Venetia Maxon. Elijio Miles, MD (Referring MD) Medicines:            Monitored Anesthesia Care Complications:        No immediate complications. Procedure:            Pre-Anesthesia Assessment:                       - ASA Grade Assessment: III - A patient with severe                        systemic disease.                       After obtaining informed consent, the colonoscope was                        passed under direct vision. Throughout the procedure,                        the patient's blood pressure, pulse, and oxygen                        saturations were monitored continuously. The Olympus                        CF-H180AL colonoscope ( S#: [id number] ) was introduced                        through the anus and advanced to the the cecum,                        identified by the appendiceal orifice, IC valve and                        transillumination. The colonoscopy was performed with                        ease. The patient tolerated the procedure well. The                        quality of the bowel preparation was good. Findings:      The perianal and digital rectal examinations were normal.      Non-bleeding internal hemorrhoids were found during retroflexion. The       hemorrhoids were medium-sized and Grade I (internal hemorrhoids that do       not prolapse).      A few small-mouthed diverticula were found in the sigmoid colon.      Two sessile polyps were  found in the transverse colon and cecum. The   polyps were 3 to 5 mm in size. These polyps were removed with a cold       biopsy forceps. Resection and retrieval were complete.      Three sessile polyps were found in the ascending colon. The polyps were       5 to 7 mm in size. These polyps were removed with a cold snare.       Resection and retrieval were complete.      The exam was otherwise without abnormality on direct and retroflexion       views. Impression:           - Non-bleeding internal hemorrhoids.                       - Diverticulosis in the sigmoid colon.                       - Two 3 to 5 mm polyps in the transverse colon and in                        the cecum, removed with a cold biopsy forceps. Resected                        and retrieved.                       - Three 5 to 7 mm polyps in the ascending colon,                        removed with a cold snare. Resected and retrieved.                       - The examination was otherwise normal on direct and                        retroflexion views. Recommendation:       - Discharge patient to home (with escort).                       - Resume previous diet.                       - Continue present medications.                       - Await pathology results.                       - Repeat colonoscopy in 3 years for surveillance. Procedure Code(s):    --- Professional ---                       [phone], Colonoscopy, flexible; with removal of tumor(s),                        polyp(s), or other lesion(s) by snare technique                       [id number], 59, Colonoscopy, flexible; with biopsy, single  or multiple Diagnosis Code(s):    --- Professional ---                       K64.0, First degree hemorrhoids                       D12.3, Benign neoplasm of transverse colon (hepatic                        flexure or splenic flexure)                       D12.0, Benign neoplasm of cecum                       D12.2, Benign neoplasm of ascending colon                        R19.4, Change in bowel habit                       K57.30, Diverticulosis of large intestine without                        perforation or abscess without bleeding CPT copyright 2016 American Medical Association. All rights reserved. The codes documented in this report are preliminary and upon coder review may  be revised to meet current compliance requirements. Jonathon Bellows, MD Jonathon Bellows MD, MD 01/05/2017 9:47:11 AM This report has been signed electronically. Number of Addenda: 0 Note Initiated On: 01/05/2017 9:02 AM Scope Withdrawal Time: 0 hours 20 minutes 21 seconds  Total Procedure Duration: 0 hours 29 minutes 54 seconds       [facility]

## 2017-01-05 NOTE — Op Note (Signed)
[facility] Gastroenterology Patient Name: Keith Gibbs Procedure Date: 01/05/2017 9:03 AM MRN: [id number] Account #: [id number] Date of Birth: 05-05-1961 Admit Type: Outpatient Age: 56 Room: [facility] ENDO ROOM 1 Gender: Male Note Status: Finalized Procedure:            Upper GI endoscopy Indications:          Cirrhosis rule out esophageal varices Providers:            Jonathon Bellows MD, MD Referring MD:         Venetia Maxon. Elijio Miles, MD (Referring MD) Medicines:            Monitored Anesthesia Care Complications:        No immediate complications. Procedure:            Pre-Anesthesia Assessment:                       - Prior to the procedure, a History and Physical was                        performed, and patient medications, allergies and                        sensitivities were reviewed. The patient's tolerance of                        previous anesthesia was reviewed.                       - The risks and benefits of the procedure and the                        sedation options and risks were discussed with the                        patient. All questions were answered and informed                        consent was obtained.                       - ASA Grade Assessment: III - A patient with severe                        systemic disease.                       After obtaining informed consent, the endoscope was                        passed under direct vision. Throughout the procedure,                        the patient's blood pressure, pulse, and oxygen                        saturations were monitored continuously. The Endoscope                        was introduced through the mouth, and advanced to the  third part of duodenum. The upper GI endoscopy was                        accomplished with ease. The patient tolerated the                        procedure well. Findings:      The esophagus was normal.      The stomach was normal.     The examined duodenum was normal. Impression:           - Normal esophagus.                       - Normal stomach.                       - Normal examined duodenum.                       - No specimens collected. Recommendation:       - Repeat upper endoscopy in 3 years for surveillance. Procedure Code(s):    --- Professional ---                       [phone], Esophagogastroduodenoscopy, flexible, transoral;                        diagnostic, including collection of specimen(s) by                        brushing or washing, when performed (separate procedure) Diagnosis Code(s):    --- Professional ---                       K74.60, Unspecified cirrhosis of liver CPT copyright 2016 American Medical Association. All rights reserved. The codes documented in this report are preliminary and upon coder review may  be revised to meet current compliance requirements. Jonathon Bellows, MD Jonathon Bellows MD, MD 01/05/2017 9:12:38 AM This report has been signed electronically. Number of Addenda: 0 Note Initiated On: 01/05/2017 9:03 AM      [facility]

## 2017-01-05 NOTE — H&P (Signed)
Keith Bellows MD [address], [place], [place] [address] Phone: [phone] Fax : [phone]  Primary Care Physician:  Jodi Marble, MD Primary Gastroenterologist:  Dr. Jonathon Gibbs   Pre-Procedure History & Physical: HPI:  BRYSTON COLOCHO is a 56 y.o. male is here for an endoscopy and colonoscopy.   Past Medical History:  Diagnosis Date  . Anxiety   . Arthritis   . Bursitis   . COPD (chronic obstructive pulmonary disease) ([place])   . Depression   . Hepatitis C 2012   No longer has Hep C    Past Surgical History:  Procedure Laterality Date  . ABDOMINAL SURGERY     Intestines  . APPENDECTOMY    . BACK SURGERY    . CARDIAC CATHETERIZATION Left 06/10/2016   Procedure: Left Heart Cath and Coronary Angiography;  Surgeon: Dionisio David, MD;  Location: [place] CV LAB;  Service: Cardiovascular;  Laterality: Left;  . CARDIAC CATHETERIZATION N/A 06/10/2016   Procedure: Coronary Stent Intervention;  Surgeon: Yolonda Kida, MD;  Location: [place] CV LAB;  Service: Cardiovascular;  Laterality: N/A;  . COLONOSCOPY    . SHOULDER SURGERY  04/09/2012    Prior to Admission medications   Medication Sig Start Date End Date Taking? Authorizing Provider  albuterol (PROAIR HFA) 108 (90 Base) MCG/ACT inhaler Inhale 2 puffs into the lungs every 6 (six) hours as needed for wheezing or shortness of breath. 12/30/16  Yes Pucilowska, Jolanta B, MD  aspirin EC 81 MG tablet Take 1 tablet (81 mg total) by mouth daily. 12/30/16  Yes Pucilowska, Jolanta B, MD  atorvastatin (LIPITOR) 40 MG tablet Take 1 tablet (40 mg total) by mouth daily at 6 PM. 12/30/16  Yes Pucilowska, Jolanta B, MD  buPROPion (WELLBUTRIN SR) 100 MG 12 hr tablet Take 100 mg by mouth 2 (two) times daily. 12/14/16  Yes [provider]  CHANTIX STARTING MONTH PAK 0.5 MG X 11 & 1 MG X 42 tablet See admin instructions. 11/16/16  Yes [provider]  DULERA 200-5 MCG/ACT AERO TAKE 2 PUFFS BY MOUTH TWICE  A DAY 12/30/16  Yes Pucilowska, Jolanta B, MD  fluvoxaMINE (LUVOX) 100 MG tablet Take 1 tablet (100 mg total) by mouth at bedtime. 12/30/16  Yes Pucilowska, Jolanta B, MD  isosorbide mononitrate (IMDUR) 30 MG 24 hr tablet Take 1 tablet (30 mg total) by mouth daily. 12/30/16  Yes Pucilowska, Jolanta B, MD  omeprazole (PRILOSEC) 20 MG capsule Take 1 capsule (20 mg total) by mouth daily. 12/30/16  Yes Pucilowska, Jolanta B, MD  oxyCODONE (OXY IR/ROXICODONE) 5 MG immediate release tablet TAKE 1 TABLET BY MOUTH 3 TIMES A DAY AS NEEDED FOR PAIN 10/30/16  Yes [provider]  QUEtiapine (SEROQUEL) 200 MG tablet Take 1 tablet (200 mg total) by mouth at bedtime. 12/30/16  Yes Pucilowska, Jolanta B, MD  rOPINIRole (REQUIP) 0.5 MG tablet TAKE 1 TO 2 TABLETS BY MOUTH TWICE A DAY AS NEEDED FOR RESTLESS LEGS. 11/16/16  Yes [provider]  tamsulosin (FLOMAX) 0.4 MG CAPS capsule Take 1 capsule (0.4 mg total) by mouth daily. 12/30/16  Yes Pucilowska, Jolanta B, MD  ticagrelor (BRILINTA) 90 MG TABS tablet Take 1 tablet (90 mg total) by mouth 2 (two) times daily. 12/30/16  Yes Pucilowska, Jolanta B, MD  acetaminophen (TYLENOL) 500 MG tablet Take 2 tablets (1,000 mg total) by mouth every 6 (six) hours as needed. For pain. 12/30/16   Pucilowska, Wardell Honour, MD  azithromycin (ZITHROMAX)  250 MG tablet TAKE 2 TABLETS BY MOUTH TODAY, THEN TAKE 1 TABLET DAILY FOR 4 DAYS 10/19/16   [provider]  nitroGLYCERIN (NITROSTAT) 0.4 MG SL tablet Place 1 tablet (0.4 mg total) under the tongue every 5 (five) minutes x 3 doses as needed for chest pain. 12/30/16   Pucilowska, Herma Ard B, MD  Omeprazole 20 MG TBEC Take 2 tablets by mouth daily. 12/14/16   [provider]  prazosin (MINIPRESS) 1 MG capsule Take 1 capsule (1 mg total) by mouth at bedtime. 12/30/16   Clovis Fredrickson, MD    Allergies as of 12/09/2016 - Review Complete 12/05/2016  Allergen Reaction Noted  . Latuda [lurasidone hcl] Other (See Comments)  01/14/2014  . Naproxen Hives and Swelling 11/04/2011  . Saphris [asenapine] Other (See Comments) 01/17/2014    Family History  Problem Relation Age of Onset  . Osteoarthritis Mother   . Heart disease Mother   . Hypertension Mother   . Heart failure Father   . Heart disease Father   . Early death Father   . Hypertension Father   . Hypertension Sister   . Prostate cancer Neg Hx   . Bladder Cancer Neg Hx   . Kidney cancer Neg Hx     Social History   Social History  . Marital status: Single    Spouse name: N/A  . Number of children: N/A  . Years of education: N/A   Occupational History  . Not on file.   Social History Main Topics  . Smoking status: Current Every Day Smoker    Packs/day: 0.50    Types: Cigarettes    Last attempt to quit: 03/04/2012  . Smokeless tobacco: Never Used  . Alcohol use No  . Drug use: No  . Sexual activity: Not on file   Other Topics Concern  . Not on file   Social History Narrative  . No narrative on file    Review of Systems: See HPI, otherwise negative ROS  Physical Exam: BP 119/73   Pulse (!) 50   Temp (!) 96.2 F (35.7 C) (Tympanic)   Resp 18   Ht 6\' 3"  (1.905 m)   Wt 205 lb (93 kg)   SpO2 100%   BMI 25.62 kg/m  General:   Alert,  pleasant and cooperative in NAD Head:  Normocephalic and atraumatic. Neck:  Supple; no masses or thyromegaly. Lungs:  Clear throughout to auscultation.    Heart:  Regular rate and rhythm. Abdomen:  Soft, nontender and nondistended. Normal bowel sounds, without guarding, and without rebound.   Neurologic:  Alert and  oriented x4;  grossly normal neurologically.  Impression/Plan: TRAVARIS KOSH is here for an endoscopy and colonoscopy to be performed for screening of esophageal varices and for evaluation of change in bowel habits  Risks, benefits, limitations, and alternatives regarding  endoscopy and colonoscopy have been reviewed with the patient.  Questions have been answered.  All parties  agreeable.   Keith Bellows, MD  01/05/2017, 8:58 AM

## 2017-01-05 NOTE — Anesthesia Preprocedure Evaluation (Signed)
Anesthesia Evaluation  Patient identified by MRN, date of birth, ID band Patient awake    Reviewed: Allergy & Precautions, NPO status , Patient's Chart, lab work & pertinent test results  History of Anesthesia Complications Negative for: history of anesthetic complications  Airway Mallampati: II  TM Distance: >3 FB Neck ROM: Full    Dental  (+) Edentulous Upper, Edentulous Lower   Pulmonary neg sleep apnea, COPD,  COPD inhaler, Current Smoker,    breath sounds clear to auscultation- rhonchi (-) wheezing      Cardiovascular hypertension, Pt. on medications (-) angina+ CAD and + Cardiac Stents   Rhythm:Regular Rate:Normal - Systolic murmurs and - Diastolic murmurs    Neuro/Psych PSYCHIATRIC DISORDERS Anxiety Depression Bipolar Disorder Schizophrenia    GI/Hepatic GERD  ,(+) Hepatitis -, C  Endo/Other  negative endocrine ROSneg diabetes  Renal/GU negative Renal ROS     Musculoskeletal  (+) Arthritis ,   Abdominal (+) - obese,   Peds  Hematology negative hematology ROS (+)   Anesthesia Other Findings Past Medical History: No date: Anxiety No date: Arthritis No date: Bursitis No date: COPD (chronic obstructive pulmonary disease) (* No date: Depression 2012: Hepatitis C     Comment: No longer has Hep C   Reproductive/Obstetrics                             Anesthesia Physical Anesthesia Plan  ASA: III  Anesthesia Plan: General   Post-op Pain Management:    Induction: Intravenous  PONV Risk Score and Plan: 0  Airway Management Planned: Natural Airway  Additional Equipment:   Intra-op Plan:   Post-operative Plan:   Informed Consent: I have reviewed the patients History and Physical, chart, labs and discussed the procedure including the risks, benefits and alternatives for the proposed anesthesia with the patient or authorized representative who has indicated his/her  understanding and acceptance.   Dental advisory given  Plan Discussed with: CRNA and Anesthesiologist  Anesthesia Plan Comments:         Anesthesia Quick Evaluation

## 2017-01-05 NOTE — Anesthesia Post-op Follow-up Note (Cosign Needed)
Anesthesia QCDR form completed.        

## 2017-01-05 NOTE — Transfer of Care (Signed)
Immediate Anesthesia Transfer of Care Note  Patient: Allex Lapoint Vila  Procedure(s) Performed: Procedure(s): ESOPHAGOGASTRODUODENOSCOPY (EGD) WITH PROPOFOL (N/A) COLONOSCOPY WITH PROPOFOL (N/A)  Patient Location: PACU  Anesthesia Type:General  Level of Consciousness: awake and alert   Airway & Oxygen Therapy: Patient Spontanous Breathing and Patient connected to nasal cannula oxygen  Post-op Assessment: Report given to RN and Post -op Vital signs reviewed and stable  Post vital signs: Reviewed and stable  Last Vitals:  Vitals:   01/05/17 0949 01/05/17 0951  BP: (!) 90/45 (!) 101/57  Pulse: (!) 52 (!) 51  Resp: 14 16  Temp: (!) 35.6 C     Last Pain:  Vitals:   01/05/17 0949  TempSrc: Tympanic         Complications: No apparent anesthesia complications

## 2017-01-05 NOTE — Anesthesia Procedure Notes (Signed)
Performed by: Chai Verdejo Pre-anesthesia Checklist: Patient identified, Emergency Drugs available, Suction available, Patient being monitored and Timeout performed Patient Re-evaluated:Patient Re-evaluated prior to inductionOxygen Delivery Method: Nasal cannula Preoxygenation: Pre-oxygenation with 100% oxygen Intubation Type: IV induction       

## 2017-01-06 LAB — SURGICAL PATHOLOGY

## 2017-01-09 ENCOUNTER — Encounter: Payer: Self-pay | Admitting: Gastroenterology

## 2017-01-19 ENCOUNTER — Emergency Department
Admission: EM | Admit: 2017-01-19 | Discharge: 2017-01-19 | Disposition: A | Discharge status: Home / Self Care | Payer: Medicaid Other | Attending: Emergency Medicine | Admitting: Emergency Medicine

## 2017-01-19 ENCOUNTER — Emergency Department: Payer: Medicaid Other

## 2017-01-19 ENCOUNTER — Encounter: Payer: Self-pay | Admitting: Emergency Medicine

## 2017-01-19 DIAGNOSIS — Y939 Activity, unspecified: Secondary | ICD-10-CM | POA: Diagnosis not present

## 2017-01-19 DIAGNOSIS — S39012A Strain of muscle, fascia and tendon of lower back, initial encounter: Secondary | ICD-10-CM | POA: Insufficient documentation

## 2017-01-19 DIAGNOSIS — J449 Chronic obstructive pulmonary disease, unspecified: Secondary | ICD-10-CM | POA: Insufficient documentation

## 2017-01-19 DIAGNOSIS — X509XXA Other and unspecified overexertion or strenuous movements or postures, initial encounter: Secondary | ICD-10-CM | POA: Insufficient documentation

## 2017-01-19 DIAGNOSIS — F17210 Nicotine dependence, cigarettes, uncomplicated: Secondary | ICD-10-CM | POA: Diagnosis not present

## 2017-01-19 DIAGNOSIS — T148XXA Other injury of unspecified body region, initial encounter: Secondary | ICD-10-CM

## 2017-01-19 DIAGNOSIS — S3992XA Unspecified injury of lower back, initial encounter: Secondary | ICD-10-CM | POA: Diagnosis present

## 2017-01-19 DIAGNOSIS — Y999 Unspecified external cause status: Secondary | ICD-10-CM | POA: Diagnosis not present

## 2017-01-19 DIAGNOSIS — M545 Low back pain, unspecified: Secondary | ICD-10-CM

## 2017-01-19 DIAGNOSIS — Z7982 Long term (current) use of aspirin: Secondary | ICD-10-CM | POA: Diagnosis not present

## 2017-01-19 DIAGNOSIS — I2510 Atherosclerotic heart disease of native coronary artery without angina pectoris: Secondary | ICD-10-CM | POA: Insufficient documentation

## 2017-01-19 DIAGNOSIS — Y929 Unspecified place or not applicable: Secondary | ICD-10-CM | POA: Insufficient documentation

## 2017-01-19 MED ORDER — LIDOCAINE 5 % EX PTCH
1.0000 | MEDICATED_PATCH | CUTANEOUS | Status: DC
  Administered 2017-01-19: 1 via TRANSDERMAL
  Filled 2017-01-19: qty 1

## 2017-01-19 MED ORDER — LIDOCAINE 5 % EX PTCH: 1.0000 | MEDICATED_PATCH | Freq: Two times a day (BID) | CUTANEOUS | 0 refills | Status: DC

## 2017-01-19 MED ORDER — ORPHENADRINE CITRATE 30 MG/ML IJ SOLN
60.0000 mg | Freq: Two times a day (BID) | INTRAMUSCULAR | Status: DC
  Administered 2017-01-19: 60 mg via INTRAMUSCULAR
  Filled 2017-01-19: qty 2

## 2017-01-19 MED ORDER — CYCLOBENZAPRINE HCL 5 MG PO TABS: 5.0000 mg | ORAL_TABLET | Freq: Three times a day (TID) | ORAL | 0 refills | Status: DC | PRN

## 2017-01-19 NOTE — ED Provider Notes (Signed)
[facility] Emergency Department Provider Note  ____________________________________________  Time seen: Approximately 11:04 AM  I have reviewed the triage vital signs and the nursing notes.   HISTORY  Chief Complaint Back Pain    HPI Keith Gibbs is a 56 y.o. male with PMH of spinal stenosis and osteoarthritis that presents to the emergency department with low back pain for 2 days. He was moving furniture and felt something pop. He states that it feels like the pain is a muscle that is tightening. Pain does not radiate. He was taken Tylenol and applying heating pads for pain, which has not helped. He denies fever, shortness of breath, chest pain, nausea, vomiting, abdominal pain, dysuria, urgency, frequency, bowel or bladder dysfunction, saddle paresthesias.   Past Medical History:  Diagnosis Date  . Anxiety   . Arthritis   . Bursitis   . COPD (chronic obstructive pulmonary disease) ([employer])   . Depression   . Hepatitis C 2012   No longer has Hep C    Patient Active Problem List   Diagnosis Date Noted  . Overdose of medication, intentional self-harm, subsequent encounter 12/28/2016  . Tobacco use disorder 12/28/2016  . Severe recurrent major depression without psychotic features ([place]) 12/27/2016  . Coronary artery disease 12/27/2016  . Alcohol use disorder, moderate, dependence ([place]) 12/27/2016  . Cocaine use disorder, moderate, dependence ([place]) 12/27/2016  . Cannabis use disorder, moderate, dependence ([place]) 12/27/2016  . Angina at rest [facility]) 06/11/2016  . Chest pain 06/10/2016  . Bipolar 1 disorder ([place]) 05/27/2013  . Myalgia and myositis 05/27/2013  . Abnormal ejaculation 11/30/2012  . Chest pain, unspecified 11/30/2012  . Degenerative arthritis of hip 11/16/2012  . Hip pain, left 08/29/2012  . Swollen testicle 07/27/2012  . Schizophrenia ([place]) 06/27/2012  . Osteoarthritis 06/27/2012  . Shoulder pain 06/27/2012  . COPD (chronic obstructive  pulmonary disease) ([place]) 06/27/2012  . Chronic hepatitis C ([place]) 06/27/2012  . GERD (gastroesophageal reflux disease) 06/27/2012  . Complete rupture of rotator cuff 02/10/2012  . Cocaine abuse in remission 12/05/2011    Past Surgical History:  Procedure Laterality Date  . ABDOMINAL SURGERY     Intestines  . APPENDECTOMY    . BACK SURGERY    . CARDIAC CATHETERIZATION Left 06/10/2016   Procedure: Left Heart Cath and Coronary Angiography;  Surgeon: Dionisio David, MD;  Location: [place] CV LAB;  Service: Cardiovascular;  Laterality: Left;  . CARDIAC CATHETERIZATION N/A 06/10/2016   Procedure: Coronary Stent Intervention;  Surgeon: Yolonda Kida, MD;  Location: [place] CV LAB;  Service: Cardiovascular;  Laterality: N/A;  . COLONOSCOPY    . COLONOSCOPY WITH PROPOFOL N/A 01/05/2017   Procedure: COLONOSCOPY WITH PROPOFOL;  Surgeon: Jonathon Bellows, MD;  Location: [facility] ENDOSCOPY;  Service: Endoscopy;  Laterality: N/A;  . ESOPHAGOGASTRODUODENOSCOPY (EGD) WITH PROPOFOL N/A 01/05/2017   Procedure: ESOPHAGOGASTRODUODENOSCOPY (EGD) WITH PROPOFOL;  Surgeon: Jonathon Bellows, MD;  Location: [facility] ENDOSCOPY;  Service: Endoscopy;  Laterality: N/A;  . SHOULDER SURGERY  04/09/2012    Prior to Admission medications   Medication Sig Start Date End Date Taking? Authorizing Provider  acetaminophen (TYLENOL) 500 MG tablet Take 2 tablets (1,000 mg total) by mouth every 6 (six) hours as needed. For pain. 12/30/16   Pucilowska, Wardell Honour, MD  albuterol (PROAIR HFA) 108 (90 Base) MCG/ACT inhaler Inhale 2 puffs into the lungs every 6 (six) hours as needed for wheezing or shortness of breath. 12/30/16   Pucilowska, Wardell Honour, MD  aspirin EC 81  MG tablet Take 1 tablet (81 mg total) by mouth daily. 12/30/16   Pucilowska, Herma Ard B, MD  atorvastatin (LIPITOR) 40 MG tablet Take 1 tablet (40 mg total) by mouth daily at 6 PM. 12/30/16   Pucilowska, Jolanta B, MD  azithromycin (ZITHROMAX) 250 MG tablet TAKE 2 TABLETS BY MOUTH  TODAY, THEN TAKE 1 TABLET DAILY FOR 4 DAYS 10/19/16   [provider]  buPROPion (WELLBUTRIN SR) 100 MG 12 hr tablet Take 100 mg by mouth 2 (two) times daily. 12/14/16   [provider]  CHANTIX STARTING MONTH PAK 0.5 MG X 11 & 1 MG X 42 tablet See admin instructions. 11/16/16   [provider]  cyclobenzaprine (FLEXERIL) 5 MG tablet Take 1 tablet (5 mg total) by mouth 3 (three) times daily as needed for muscle spasms. 01/19/17 01/26/17  Laban Emperor, PA-C  DULERA 200-5 MCG/ACT AERO TAKE 2 PUFFS BY MOUTH TWICE A DAY 12/30/16   Pucilowska, Jolanta B, MD  fluvoxaMINE (LUVOX) 100 MG tablet Take 1 tablet (100 mg total) by mouth at bedtime. 12/30/16   Pucilowska, Herma Ard B, MD  isosorbide mononitrate (IMDUR) 30 MG 24 hr tablet Take 1 tablet (30 mg total) by mouth daily. 12/30/16   Pucilowska, Jolanta B, MD  lidocaine (LIDODERM) 5 % Place 1 patch onto the skin every 12 (twelve) hours. Remove & Discard patch within 12 hours or as directed by MD 01/19/17 01/19/18  Laban Emperor, PA-C  nitroGLYCERIN (NITROSTAT) 0.4 MG SL tablet Place 1 tablet (0.4 mg total) under the tongue every 5 (five) minutes x 3 doses as needed for chest pain. 12/30/16   Pucilowska, Herma Ard B, MD  omeprazole (PRILOSEC) 20 MG capsule Take 1 capsule (20 mg total) by mouth daily. 12/30/16   Pucilowska, Herma Ard B, MD  Omeprazole 20 MG TBEC Take 2 tablets by mouth daily. 12/14/16   [provider]  oxyCODONE (OXY IR/ROXICODONE) 5 MG immediate release tablet TAKE 1 TABLET BY MOUTH 3 TIMES A DAY AS NEEDED FOR PAIN 10/30/16   [provider]  prazosin (MINIPRESS) 1 MG capsule Take 1 capsule (1 mg total) by mouth at bedtime. 12/30/16   Pucilowska, Herma Ard B, MD  QUEtiapine (SEROQUEL) 200 MG tablet Take 1 tablet (200 mg total) by mouth at bedtime. 12/30/16   Pucilowska, Jolanta B, MD  rOPINIRole (REQUIP) 0.5 MG tablet TAKE 1 TO 2 TABLETS BY MOUTH TWICE A DAY AS NEEDED FOR RESTLESS LEGS. 11/16/16   [provider]   tamsulosin (FLOMAX) 0.4 MG CAPS capsule Take 1 capsule (0.4 mg total) by mouth daily. 12/30/16   Pucilowska, Herma Ard B, MD  ticagrelor (BRILINTA) 90 MG TABS tablet Take 1 tablet (90 mg total) by mouth 2 (two) times daily. 12/30/16   Pucilowska, Wardell Honour, MD    Allergies Anette Guarneri [lurasidone hcl]; Naproxen; and Saphris [asenapine]  Family History  Problem Relation Age of Onset  . Osteoarthritis Mother   . Heart disease Mother   . Hypertension Mother   . Heart failure Father   . Heart disease Father   . Early death Father   . Hypertension Father   . Hypertension Sister   . Prostate cancer Neg Hx   . Bladder Cancer Neg Hx   . Kidney cancer Neg Hx     Social History Social History  Substance Use Topics  . Smoking status: Current Every Day Smoker    Packs/day: 0.50    Types: Cigarettes    Last attempt to quit: 03/04/2012  . Smokeless tobacco: Never  Used  . Alcohol use No     Review of Systems  Constitutional: No fever/chills Cardiovascular: No chest pain. Respiratory:  No SOB. Gastrointestinal: No abdominal pain.  No nausea, no vomiting.  Musculoskeletal: Positive for low back pain. Skin: Negative for rash, abrasions, lacerations, ecchymosis. Neurological: Negative for headaches, numbness or tingling   ____________________________________________   PHYSICAL EXAM:  VITAL SIGNS: ED Triage Vitals  Enc Vitals Group     BP 01/19/17 1013 119/63     Pulse Rate 01/19/17 1013 63     Resp 01/19/17 1013 20     Temp 01/19/17 1013 98 F (36.7 C)     Temp Source 01/19/17 1013 Oral     SpO2 01/19/17 1013 98 %     Weight 01/19/17 1013 205 lb (93 kg)     Height 01/19/17 1013 6\' 3"  (1.905 m)     Head Circumference --      Peak Flow --      Pain Score 01/19/17 1023 10     Pain Loc --      Pain Edu? --      Excl. in [place]? --      Constitutional: Alert and oriented. Well appearing and in no acute distress. Eyes: Conjunctivae are normal. PERRL. EOMI. Head: Atraumatic. ENT:       Ears:      Nose: No congestion/rhinnorhea.      Mouth/Throat: Mucous membranes are moist.  Neck: No stridor.  Cardiovascular: Normal rate, regular rhythm.  Good peripheral circulation. Respiratory: Normal respiratory effort without tachypnea or retractions. Lungs CTAB. Good air entry to the bases with no decreased or absent breath sounds. Gastrointestinal: Bowel sounds 4 quadrants. Soft and nontender to palpation. No guarding or rigidity. No palpable masses. No distention. No CVA tenderness. Musculoskeletal: Full range of motion to all extremities. No gross deformities appreciated. Diffuse tenderness to palpation over lumbar muscles. No pinpoint tenderness to palpation over lumbar spine. Negative straight leg and cross leg raise. Neurologic:  Normal speech and language. No gross focal neurologic deficits are appreciated.  Skin:  Skin is warm, dry and intact. No rash noted.  ____________________________________________   LABS (all labs ordered are listed, but only abnormal results are displayed)  Labs Reviewed - No data to display ____________________________________________  EKG   ____________________________________________  RADIOLOGY Robinette Haines, personally viewed and evaluated these images (plain radiographs) as part of my medical decision making, as well as reviewing the written report by the radiologist.  Dg Lumbar Spine Complete  Result Date: 01/19/2017 CLINICAL DATA:  Low back pain for 2 days, injured lifting EXAM: LUMBAR SPINE - COMPLETE 4+ VIEW COMPARISON:  CT of abdomen pelvis of 09/01/2016 FINDINGS: The lumbar vertebrae remain in normal alignment. Hardware for posterior fusion from L4-S1 is stable ith no complicating features noted. Interbody fusion devices at L4-5 and L5-S1 appear unchanged in position compared to the prior CT images. No compression deformity is noted. A limbus vertebra which is a normal variation is present at L3. No compression deformity is seen.  The SI joints are corticated. IMPRESSION: Normal alignment. Stable posterior fusion from L4-S1. No acute abnormality. Electronically Signed   By: Ivar Drape M.D.   On: 01/19/2017 12:05    ____________________________________________    PROCEDURES  Procedure(s) performed:    Procedures    Medications  orphenadrine (NORFLEX) injection 60 mg (60 mg Intramuscular Given 01/19/17 1229)  lidocaine (LIDODERM) 5 % 1 patch (1 patch Transdermal Patch Applied 01/19/17 1255)  ____________________________________________   INITIAL IMPRESSION / ASSESSMENT AND PLAN / ED COURSE  Pertinent labs & imaging results that were available during my care of the patient were reviewed by me and considered in my medical decision making (see chart for details).  Review of the [place] CSRS was performed in accordance of the [place] prior to dispensing any controlled drugs.  Patient's diagnosis is consistent with muscle strain. Vital signs and exam are reassuring. X-ray negative for acute bony abnormalities. X-ray consistent with stable prior posterior fusion. Patient denies bowel or bladder dysfunction or saddle paresthesias. Pain does not radiate. He has a history of alcohol and drug abuse will not to narcotics or Toradol injection. Patient will be discharged home with prescriptions for Flexeril. Patient is to follow up with  PCP as directed. Patient is given ED precautions to return to the ED for any worsening or new symptoms.     ____________________________________________  FINAL CLINICAL IMPRESSION(S) / ED DIAGNOSES  Final diagnoses:  Bilateral low back pain without sciatica, unspecified chronicity  Muscle strain      NEW MEDICATIONS STARTED DURING THIS VISIT:  Discharge Medication List as of 01/19/2017 12:35 PM    START taking these medications   Details  cyclobenzaprine (FLEXERIL) 5 MG tablet Take 1 tablet (5 mg total) by mouth 3 (three) times daily as needed for muscle spasms., Starting Thu  01/19/2017, Until Thu 01/26/2017, Print    lidocaine (LIDODERM) 5 % Place 1 patch onto the skin every 12 (twelve) hours. Remove & Discard patch within 12 hours or as directed by MD, Starting Thu 01/19/2017, Until Fri 01/19/2018, Print            This chart was dictated using voice recognition software/Dragon. Despite best efforts to proofread, errors can occur which can change the meaning. Any change was purely unintentional.    Laban Emperor, PA-C 01/19/17 [place], [place], MD 01/19/17 1525

## 2017-01-19 NOTE — ED Triage Notes (Signed)
Pt c/o lower back pain for 2 days. Was lifting cough and heard pop. No loss of bowel or bladder.  Bending over relieves pain. Standing, walking makes pain worse.

## 2017-02-17 ENCOUNTER — Emergency Department
Admission: EM | Admit: 2017-02-17 | Discharge: 2017-02-17 | Disposition: A | Discharge status: Home / Self Care | Payer: Medicaid Other | Attending: Emergency Medicine | Admitting: Emergency Medicine

## 2017-02-17 ENCOUNTER — Encounter: Payer: Self-pay | Admitting: Emergency Medicine

## 2017-02-17 DIAGNOSIS — Z79899 Other long term (current) drug therapy: Secondary | ICD-10-CM | POA: Insufficient documentation

## 2017-02-17 DIAGNOSIS — R079 Chest pain, unspecified: Secondary | ICD-10-CM | POA: Insufficient documentation

## 2017-02-17 DIAGNOSIS — J449 Chronic obstructive pulmonary disease, unspecified: Secondary | ICD-10-CM | POA: Insufficient documentation

## 2017-02-17 DIAGNOSIS — F17210 Nicotine dependence, cigarettes, uncomplicated: Secondary | ICD-10-CM | POA: Diagnosis not present

## 2017-02-17 DIAGNOSIS — R42 Dizziness and giddiness: Secondary | ICD-10-CM | POA: Diagnosis not present

## 2017-02-17 DIAGNOSIS — Z7982 Long term (current) use of aspirin: Secondary | ICD-10-CM | POA: Insufficient documentation

## 2017-02-17 LAB — COMPREHENSIVE METABOLIC PANEL
ALBUMIN: 4.8 g/dL (ref 3.5–5.0)
ALK PHOS: 55 U/L (ref 38–126)
ALT: 40 U/L (ref 17–63)
ANION GAP: 10 (ref 5–15)
AST: 32 U/L (ref 15–41)
BILIRUBIN TOTAL: 0.8 mg/dL (ref 0.3–1.2)
BUN: 14 mg/dL (ref 6–20)
CALCIUM: 9.5 mg/dL (ref 8.9–10.3)
CO2: 24 mmol/L (ref 22–32)
CREATININE: 0.99 mg/dL (ref 0.61–1.24)
Chloride: 101 mmol/L (ref 101–111)
GFR calc Af Amer: >60 mL/min (ref >60)
GFR calc non Af Amer: >60 mL/min (ref >60)
Glucose, Bld: 117 mg/dL — ABNORMAL HIGH (ref 65–99)
Potassium: 4.3 mmol/L (ref 3.5–5.1)
Sodium: 135 mmol/L (ref 135–145)
TOTAL PROTEIN: 7.9 g/dL (ref 6.5–8.1)

## 2017-02-17 LAB — CBC
HEMATOCRIT: 46.1 % (ref 40.0–52.0)
Hemoglobin: 16.1 g/dL (ref 13.0–18.0)
MCH: 32.6 pg (ref 26.0–34.0)
MCHC: 35 g/dL (ref 32.0–36.0)
MCV: 93.2 fL (ref 80.0–100.0)
Platelets: 172 10*3/uL (ref 150–440)
RBC: 4.95 MIL/uL (ref 4.40–5.90)
RDW: 13.8 % (ref 11.5–14.5)
WBC: 8.9 10*3/uL (ref 3.8–10.6)

## 2017-02-17 LAB — TROPONIN I: Troponin I: <0.03 ng/mL (ref <0.03)

## 2017-02-17 MED ORDER — LORAZEPAM 0.5 MG PO TABS: 0.5000 mg | ORAL_TABLET | Freq: Three times a day (TID) | ORAL | 0 refills | Status: DC | PRN

## 2017-02-17 MED ORDER — LORAZEPAM 1 MG PO TABS
1.0000 mg | ORAL_TABLET | Freq: Once | ORAL | Status: AC
  Administered 2017-02-17: 1 mg via ORAL
  Filled 2017-02-17: qty 1

## 2017-02-17 NOTE — ED Provider Notes (Signed)
[facility] Emergency Department Provider Note  Time seen: 9:26 AM  I have reviewed the triage vital signs and the nursing notes.   HISTORY  Chief Complaint Dizziness and Chest Pain    HPI Keith Gibbs is a 56 y.o. male with a past medical history of anxiety, COPD, depression, alcoholism, bipolar, presents to the emergency department for symptoms of dizziness and shaking. According to the patient he recently got out of rehabilitation (alcohol and cocaine abuse). He states over the past 1-2 weeks he has been feeling symptoms of dizziness shaking/tremor and intermittent chest discomfort. Patient was placed on several new medications during his rehabilitation, he states his last alcohol and cocaine use was on 01/27/17. Denies any current chest pain or shortness of breath. States a mild headache. Denies any focal weakness or numbness.  Past Medical History:  Diagnosis Date  . Anxiety   . Arthritis   . Bursitis   . COPD (chronic obstructive pulmonary disease) ([place])   . Depression   . Hepatitis C 2012   No longer has Hep C    Patient Active Problem List   Diagnosis Date Noted  . Overdose of medication, intentional self-harm, subsequent encounter 12/28/2016  . Tobacco use disorder 12/28/2016  . Severe recurrent major depression without psychotic features ([place]) 12/27/2016  . Coronary artery disease 12/27/2016  . Alcohol use disorder, moderate, dependence ([place]) 12/27/2016  . Cocaine use disorder, moderate, dependence ([place]) 12/27/2016  . Cannabis use disorder, moderate, dependence ([place]) 12/27/2016  . Angina at rest [facility]) 06/11/2016  . Chest pain 06/10/2016  . Bipolar 1 disorder ([place]) 05/27/2013  . Myalgia and myositis 05/27/2013  . Abnormal ejaculation 11/30/2012  . Chest pain, unspecified 11/30/2012  . Degenerative arthritis of hip 11/16/2012  . Hip pain, left 08/29/2012  . Swollen testicle 07/27/2012  . Schizophrenia ([place]) 06/27/2012  . Osteoarthritis  06/27/2012  . Shoulder pain 06/27/2012  . COPD (chronic obstructive pulmonary disease) ([place]) 06/27/2012  . Chronic hepatitis C ([place]) 06/27/2012  . GERD (gastroesophageal reflux disease) 06/27/2012  . Complete rupture of rotator cuff 02/10/2012  . Cocaine abuse in remission 12/05/2011    Past Surgical History:  Procedure Laterality Date  . ABDOMINAL SURGERY     Intestines  . APPENDECTOMY    . BACK SURGERY    . CARDIAC CATHETERIZATION Left 06/10/2016   Procedure: Left Heart Cath and Coronary Angiography;  Surgeon: Dionisio David, MD;  Location: [place] CV LAB;  Service: Cardiovascular;  Laterality: Left;  . CARDIAC CATHETERIZATION N/A 06/10/2016   Procedure: Coronary Stent Intervention;  Surgeon: Yolonda Kida, MD;  Location: [place] CV LAB;  Service: Cardiovascular;  Laterality: N/A;  . COLONOSCOPY    . COLONOSCOPY WITH PROPOFOL N/A 01/05/2017   Procedure: COLONOSCOPY WITH PROPOFOL;  Surgeon: Jonathon Bellows, MD;  Location: [facility] ENDOSCOPY;  Service: Endoscopy;  Laterality: N/A;  . ESOPHAGOGASTRODUODENOSCOPY (EGD) WITH PROPOFOL N/A 01/05/2017   Procedure: ESOPHAGOGASTRODUODENOSCOPY (EGD) WITH PROPOFOL;  Surgeon: Jonathon Bellows, MD;  Location: [facility] ENDOSCOPY;  Service: Endoscopy;  Laterality: N/A;  . SHOULDER SURGERY  04/09/2012    Prior to Admission medications   Medication Sig Start Date End Date Taking? Authorizing Provider  acetaminophen (TYLENOL) 500 MG tablet Take 2 tablets (1,000 mg total) by mouth every 6 (six) hours as needed. For pain. 12/30/16   Pucilowska, Wardell Honour, MD  albuterol (PROAIR HFA) 108 (90 Base) MCG/ACT inhaler Inhale 2 puffs into the lungs every 6 (six) hours as needed for wheezing or shortness of  breath. 12/30/16   Pucilowska, Herma Ard B, MD  aspirin EC 81 MG tablet Take 1 tablet (81 mg total) by mouth daily. 12/30/16   Pucilowska, Herma Ard B, MD  atorvastatin (LIPITOR) 40 MG tablet Take 1 tablet (40 mg total) by mouth daily at 6 PM. 12/30/16   Pucilowska, Jolanta  B, MD  azithromycin (ZITHROMAX) 250 MG tablet TAKE 2 TABLETS BY MOUTH TODAY, THEN TAKE 1 TABLET DAILY FOR 4 DAYS 10/19/16   [provider]  buPROPion (WELLBUTRIN SR) 100 MG 12 hr tablet Take 100 mg by mouth 2 (two) times daily. 12/14/16   [provider]  CHANTIX STARTING MONTH PAK 0.5 MG X 11 & 1 MG X 42 tablet See admin instructions. 11/16/16   [provider]  DULERA 200-5 MCG/ACT AERO TAKE 2 PUFFS BY MOUTH TWICE A DAY 12/30/16   Pucilowska, Jolanta B, MD  fluvoxaMINE (LUVOX) 100 MG tablet Take 1 tablet (100 mg total) by mouth at bedtime. 12/30/16   Pucilowska, Herma Ard B, MD  isosorbide mononitrate (IMDUR) 30 MG 24 hr tablet Take 1 tablet (30 mg total) by mouth daily. 12/30/16   Pucilowska, Jolanta B, MD  lidocaine (LIDODERM) 5 % Place 1 patch onto the skin every 12 (twelve) hours. Remove & Discard patch within 12 hours or as directed by MD 01/19/17 01/19/18  Laban Emperor, PA-C  nitroGLYCERIN (NITROSTAT) 0.4 MG SL tablet Place 1 tablet (0.4 mg total) under the tongue every 5 (five) minutes x 3 doses as needed for chest pain. 12/30/16   Pucilowska, Herma Ard B, MD  omeprazole (PRILOSEC) 20 MG capsule Take 1 capsule (20 mg total) by mouth daily. 12/30/16   Pucilowska, Herma Ard B, MD  Omeprazole 20 MG TBEC Take 2 tablets by mouth daily. 12/14/16   [provider]  oxyCODONE (OXY IR/ROXICODONE) 5 MG immediate release tablet TAKE 1 TABLET BY MOUTH 3 TIMES A DAY AS NEEDED FOR PAIN 10/30/16   [provider]  prazosin (MINIPRESS) 1 MG capsule Take 1 capsule (1 mg total) by mouth at bedtime. 12/30/16   Pucilowska, Herma Ard B, MD  QUEtiapine (SEROQUEL) 200 MG tablet Take 1 tablet (200 mg total) by mouth at bedtime. 12/30/16   Pucilowska, Jolanta B, MD  rOPINIRole (REQUIP) 0.5 MG tablet TAKE 1 TO 2 TABLETS BY MOUTH TWICE A DAY AS NEEDED FOR RESTLESS LEGS. 11/16/16   [provider]  tamsulosin (FLOMAX) 0.4 MG CAPS capsule Take 1 capsule (0.4 mg total) by mouth daily. 12/30/16    Pucilowska, Herma Ard B, MD  ticagrelor (BRILINTA) 90 MG TABS tablet Take 1 tablet (90 mg total) by mouth 2 (two) times daily. 12/30/16   Pucilowska, Wardell Honour, MD    Allergies  Allergen Reactions  . Latuda [Lurasidone Hcl] Other (See Comments)    tremor tremor  . Naproxen Hives and Swelling  . Saphris [Asenapine] Other (See Comments)    Increased tremors Increased tremors    Family History  Problem Relation Age of Onset  . Osteoarthritis Mother   . Heart disease Mother   . Hypertension Mother   . Heart failure Father   . Heart disease Father   . Early death Father   . Hypertension Father   . Hypertension Sister   . Prostate cancer Neg Hx   . Bladder Cancer Neg Hx   . Kidney cancer Neg Hx     Social History Social History  Substance Use Topics  . Smoking status: Current Every Day Smoker    Packs/day: 0.50    Types:  Cigarettes    Last attempt to quit: 03/04/2012  . Smokeless tobacco: Never Used  . Alcohol use No    Review of Systems Constitutional: Negative for fever. Cardiovascular: States intermittent chest discomfort which is brief and intermittent over the past 2-3 weeks. None currently. Respiratory: Negative for shortness of breath. Gastrointestinal: Negative for abdominal pain. Negative for vomiting or diarrhea. Genitourinary: Negative for dysuria. Musculoskeletal: Negative for back pain. Neurological: Mild headache currently. All other ROS negative  ____________________________________________   PHYSICAL EXAM:  VITAL SIGNS: ED Triage Vitals  Enc Vitals Group     BP 02/17/17 0922 115/73     Pulse Rate 02/17/17 0922 (!) 59     Resp 02/17/17 0922 16     Temp 02/17/17 0922 97.7 F (36.5 C)     Temp Source 02/17/17 0922 Oral     SpO2 02/17/17 0922 99 %     Weight 02/17/17 0924 205 lb (93 kg)     Height 02/17/17 0924 6\' 3"  (1.905 m)     Head Circumference --      Peak Flow --      Pain Score 02/17/17 0921 7     Pain Loc --      Pain Edu? --       Excl. in [place]? --     Constitutional: Alert and oriented. Somewhat anxious appearing. Mild tremor. Eyes: Normal exam ENT   Head: Normocephalic and atraumatic.   Mouth/Throat: Mucous membranes are moist. Cardiovascular: Normal rate, regular rhythm. No murmur Respiratory: Normal respiratory effort without tachypnea nor retractions. Breath sounds are clear  Gastrointestinal: Soft and nontender. No distention.   Musculoskeletal: Nontender with normal range of motion in all extremities.  Neurologic:  Normal speech and language. No gross focal neurologic deficits  Skin:  Skin is warm, dry and intact.  Psychiatric: Mood and affect are normal.  ____________________________________________    EKG  EKG reviewed and interpreted by myself shows normal sinus rhythm at 56 bpm, narrow QRS, normal axis. Nonspecific ST changes.  ____________________________________________     INITIAL IMPRESSION / ASSESSMENT AND PLAN / ED COURSE  Pertinent labs & imaging results that were available during my care of the patient were reviewed by me and considered in my medical decision making (see chart for details).  The patient presents to the emergency department with vague symptoms of dizziness, shaking/tremor at times, intermittent chest discomfort. Patient was placed on several new medications during his rehabilitation, denies any recent alcohol or drug use. Patient does state back pain which is chronic for him. States he has been on narcotic medications for a very long time and has been weaned down to tramadol. Patient is currently taking naltrexone as well since being discharged from rehabilitation. This could be interacting causing a mild opioid withdrawal which could explain the patient's symptoms. It is been 3 weeks since the patient's last alcohol or drug use. I discussed with the patient discontinuing naltrexone, and following up with [facility] and [facility]. Given the patient's intermittent chest discomfort we  will also check labs in the emergency department. EKG is reassuring. Overall patient appears well, no distress. Patient does have a very slight tremor during exam.  Patient's labs are normal. I suspect his symptoms are likely due to his new medications. He will follow-up with his doctor.  ____________________________________________   FINAL CLINICAL IMPRESSION(S) / ED DIAGNOSES  dizziness    Harvest Dark, MD 02/17/17 [phone]

## 2017-02-17 NOTE — ED Triage Notes (Signed)
Patient to ER for c/o feeling dizzy, light headed, nauseated, shaky, headache, and intermittent chest pain. Patient was recently in rehab for ETOH and Cocaine. Patient states last use of both was 01/27/17. Denies unilateral weakness, slurred speech, or facial droop, but does report feeling "disoriented".

## 2017-02-19 ENCOUNTER — Emergency Department
Admission: EM | Admit: 2017-02-19 | Discharge: 2017-02-19 | Disposition: A | Discharge status: Home / Self Care | Payer: Medicaid Other

## 2017-02-20 ENCOUNTER — Emergency Department
Admission: EM | Admit: 2017-02-20 | Discharge: 2017-02-20 | Disposition: A | Discharge status: Home / Self Care | Payer: Medicaid Other | Attending: Emergency Medicine | Admitting: Emergency Medicine

## 2017-02-20 ENCOUNTER — Emergency Department: Payer: Medicaid Other

## 2017-02-20 DIAGNOSIS — Z7982 Long term (current) use of aspirin: Secondary | ICD-10-CM | POA: Diagnosis not present

## 2017-02-20 DIAGNOSIS — J449 Chronic obstructive pulmonary disease, unspecified: Secondary | ICD-10-CM | POA: Diagnosis not present

## 2017-02-20 DIAGNOSIS — Z79899 Other long term (current) drug therapy: Secondary | ICD-10-CM | POA: Diagnosis not present

## 2017-02-20 DIAGNOSIS — F17210 Nicotine dependence, cigarettes, uncomplicated: Secondary | ICD-10-CM | POA: Diagnosis not present

## 2017-02-20 DIAGNOSIS — R1030 Lower abdominal pain, unspecified: Secondary | ICD-10-CM | POA: Diagnosis present

## 2017-02-20 DIAGNOSIS — K529 Noninfective gastroenteritis and colitis, unspecified: Secondary | ICD-10-CM | POA: Insufficient documentation

## 2017-02-20 LAB — TROPONIN I

## 2017-02-20 LAB — CBC WITH DIFFERENTIAL/PLATELET
Basophils Absolute: 0 10*3/uL (ref 0–0.1)
Basophils Relative: 0 %
Eosinophils Absolute: 0.2 10*3/uL (ref 0–0.7)
Eosinophils Relative: 2 %
HCT: 49.2 % (ref 40.0–52.0)
Hemoglobin: 17.3 g/dL (ref 13.0–18.0)
Lymphocytes Relative: 15 %
Lymphs Abs: 1.6 10*3/uL (ref 1.0–3.6)
MCH: 32.7 pg (ref 26.0–34.0)
MCHC: 35.3 g/dL (ref 32.0–36.0)
MCV: 92.9 fL (ref 80.0–100.0)
Monocytes Absolute: 0.6 10*3/uL (ref 0.2–1.0)
Monocytes Relative: 5 %
NEUTROS ABS: 8.4 10*3/uL — AB (ref 1.4–6.5)
NEUTROS PCT: 78 %
PLATELETS: 177 10*3/uL (ref 150–440)
RBC: 5.3 MIL/uL (ref 4.40–5.90)
RDW: 13.6 % (ref 11.5–14.5)
WBC: 10.9 10*3/uL — AB (ref 3.8–10.6)

## 2017-02-20 LAB — BASIC METABOLIC PANEL
ANION GAP: 9 (ref 5–15)
BUN: 17 mg/dL (ref 6–20)
CALCIUM: 9.7 mg/dL (ref 8.9–10.3)
CHLORIDE: 107 mmol/L (ref 101–111)
CO2: 22 mmol/L (ref 22–32)
Creatinine, Ser: 1.18 mg/dL (ref 0.61–1.24)
Glucose, Bld: 123 mg/dL — ABNORMAL HIGH (ref 65–99)
POTASSIUM: 4.2 mmol/L (ref 3.5–5.1)
SODIUM: 138 mmol/L (ref 135–145)

## 2017-02-20 LAB — HEPATIC FUNCTION PANEL
ALBUMIN: 4.9 g/dL (ref 3.5–5.0)
ALT: 31 U/L (ref 17–63)
AST: 27 U/L (ref 15–41)
Alkaline Phosphatase: 58 U/L (ref 38–126)
Bilirubin, Direct: 0.1 mg/dL (ref 0.1–0.5)
Indirect Bilirubin: 0.7 mg/dL (ref 0.3–0.9)
TOTAL PROTEIN: 7.9 g/dL (ref 6.5–8.1)
Total Bilirubin: 0.8 mg/dL (ref 0.3–1.2)

## 2017-02-20 LAB — LACTIC ACID, PLASMA: LACTIC ACID, VENOUS: 1.2 mmol/L (ref 0.5–1.9)

## 2017-02-20 LAB — LIPASE, BLOOD: Lipase: 45 U/L (ref 11–51)

## 2017-02-20 MED ORDER — ONDANSETRON HCL 4 MG/2ML IJ SOLN
4.0000 mg | Freq: Once | INTRAMUSCULAR | Status: AC
  Administered 2017-02-20: 4 mg via INTRAVENOUS
  Filled 2017-02-20: qty 2

## 2017-02-20 MED ORDER — SODIUM CHLORIDE 0.9 % IV BOLUS (SEPSIS)
1000.0000 mL | Freq: Once | INTRAVENOUS | Status: AC
  Administered 2017-02-20: 1000 mL via INTRAVENOUS

## 2017-02-20 MED ORDER — FENTANYL CITRATE (PF) 100 MCG/2ML IJ SOLN
100.0000 ug | Freq: Once | INTRAMUSCULAR | Status: AC
  Administered 2017-02-20: 100 ug via INTRAVENOUS
  Filled 2017-02-20: qty 2

## 2017-02-20 MED ORDER — IOPAMIDOL (ISOVUE-300) INJECTION 61%
100.0000 mL | Freq: Once | INTRAVENOUS | Status: AC | PRN
Start: 2017-02-20 — End: 2017-02-20
  Administered 2017-02-20: 100 mL via INTRAVENOUS

## 2017-02-20 MED ORDER — HYDROCODONE-ACETAMINOPHEN 5-325 MG PO TABS: 1.0000 | ORAL_TABLET | Freq: Four times a day (QID) | ORAL | 0 refills | Status: DC | PRN

## 2017-02-20 MED ORDER — CIPROFLOXACIN HCL 500 MG PO TABS: 500.0000 mg | ORAL_TABLET | Freq: Two times a day (BID) | ORAL | 0 refills | Status: AC

## 2017-02-20 NOTE — ED Notes (Signed)
Pt returning from CT

## 2017-02-20 NOTE — ED Triage Notes (Signed)
Patient presents with lower abdominal pain that comes in waves. States it is so bad he can't stand it anymore. States N/V/D. Emesis x 1. Woke him up and had to go to the bathroom and throwup.

## 2017-02-20 NOTE — ED Notes (Signed)
Pt c/o abd pain since yesterday afternoon; actually registered in the ED last evening but did not want to wait for triage as it was very busy at that time; said he just went home to try and rest there; pt says at that time his pain was to his upper abd; pt presents this am with pain across lower abd; rates 10/10 at all times but type of pain changes; pain in mostly and aching pain but then he gets waves of sharp, stabbing and burning pain intermittently; reports vomiting multiple times, last episode was yellow bile and occurred just before coming to the ED

## 2017-02-20 NOTE — ED Provider Notes (Signed)
[facility] Emergency Department Provider Note  ____________________________________________   First MD Initiated Contact with Patient 02/20/17 0413     (approximate)  I have reviewed the triage vital signs and the nursing notes.   HISTORY  Chief Complaint Abdominal Pain   HPI Keith Gibbs is a 56 y.o. male who presents to the emergency department with roughly 24 hours of severe intermittent lower abdominal pain. The pain is the most intense he is ever felt in his life and it comes in waves. When it comes it lasts 5-10 seconds at a time and then resolves. It happens multiple times every hour. He initially came to our emergency department yesterday but noted that the wait was too long so he went home thinking it would get better. She was unable to sleep last night as the pain kept him up and prompted the visit again today. He's had some nausea but no vomiting. No fevers or chills. Unclear what makes the pain better or worse. He has a complex past medical history including coronary artery disease, schizoaffective disorder, COPD, and alcohol and cocaine abuse. He has a remote abdominal surgical history of appendectomy.   Past Medical History:  Diagnosis Date  . Anxiety   . Arthritis   . Bursitis   . COPD (chronic obstructive pulmonary disease) ([place])   . Depression   . Hepatitis C 2012   No longer has Hep C    Patient Active Problem List   Diagnosis Date Noted  . Overdose of medication, intentional self-harm, subsequent encounter 12/28/2016  . Tobacco use disorder 12/28/2016  . Severe recurrent major depression without psychotic features ([place]) 12/27/2016  . Coronary artery disease 12/27/2016  . Alcohol use disorder, moderate, dependence ([place]) 12/27/2016  . Cocaine use disorder, moderate, dependence ([place]) 12/27/2016  . Cannabis use disorder, moderate, dependence ([place]) 12/27/2016  . Angina at rest [facility]) 06/11/2016  . Chest pain 06/10/2016  . Bipolar 1  disorder ([place]) 05/27/2013  . Myalgia and myositis 05/27/2013  . Abnormal ejaculation 11/30/2012  . Chest pain, unspecified 11/30/2012  . Degenerative arthritis of hip 11/16/2012  . Hip pain, left 08/29/2012  . Swollen testicle 07/27/2012  . Schizophrenia ([place]) 06/27/2012  . Osteoarthritis 06/27/2012  . Shoulder pain 06/27/2012  . COPD (chronic obstructive pulmonary disease) ([place]) 06/27/2012  . Chronic hepatitis C ([place]) 06/27/2012  . GERD (gastroesophageal reflux disease) 06/27/2012  . Complete rupture of rotator cuff 02/10/2012  . Cocaine abuse in remission 12/05/2011    Past Surgical History:  Procedure Laterality Date  . ABDOMINAL SURGERY     Intestines  . APPENDECTOMY    . BACK SURGERY    . CARDIAC CATHETERIZATION Left 06/10/2016   Procedure: Left Heart Cath and Coronary Angiography;  Surgeon: Dionisio David, MD;  Location: [place] [facility];  Service: Cardiovascular;  Laterality: Left;  . CARDIAC CATHETERIZATION N/A 06/10/2016   Procedure: Coronary Stent Intervention;  Surgeon: Yolonda Kida, MD;  Location: [place] [facility];  Service: Cardiovascular;  Laterality: N/A;  . COLONOSCOPY    . COLONOSCOPY WITH PROPOFOL N/A 01/05/2017   Procedure: COLONOSCOPY WITH PROPOFOL;  Surgeon: Jonathon Bellows, MD;  Location: [facility] ENDOSCOPY;  Service: Endoscopy;  Laterality: N/A;  . ESOPHAGOGASTRODUODENOSCOPY (EGD) WITH PROPOFOL N/A 01/05/2017   Procedure: ESOPHAGOGASTRODUODENOSCOPY (EGD) WITH PROPOFOL;  Surgeon: Jonathon Bellows, MD;  Location: [facility] ENDOSCOPY;  Service: Endoscopy;  Laterality: N/A;  . SHOULDER SURGERY  04/09/2012    Prior to Admission medications   Medication Sig Start Date  End Date Taking? Authorizing Provider  acetaminophen (TYLENOL) 500 MG tablet Take 2 tablets (1,000 mg total) by mouth every 6 (six) hours as needed. For pain. 12/30/16   Pucilowska, Wardell Honour, MD  albuterol (PROAIR HFA) 108 (90 Base) MCG/ACT inhaler Inhale 2 puffs into the lungs every 6 (six) hours as needed  for wheezing or shortness of breath. 12/30/16   Pucilowska, Herma Ard B, MD  aspirin EC 81 MG tablet Take 1 tablet (81 mg total) by mouth daily. 12/30/16   Pucilowska, Herma Ard B, MD  atorvastatin (LIPITOR) 40 MG tablet Take 1 tablet (40 mg total) by mouth daily at 6 PM. 12/30/16   Pucilowska, Jolanta B, MD  ciprofloxacin (CIPRO) 500 MG tablet Take 1 tablet (500 mg total) by mouth 2 (two) times daily. 02/20/17 02/23/17  Darel Hong, MD  diphenhydrAMINE (BENADRYL) 25 mg capsule Take 25 mg by mouth daily.    [provider]  DULERA 200-5 MCG/ACT AERO TAKE 2 PUFFS BY MOUTH TWICE A DAY 12/30/16   Pucilowska, Jolanta B, MD  fluvoxaMINE (LUVOX) 100 MG tablet Take 1 tablet (100 mg total) by mouth at bedtime. 12/30/16   Pucilowska, Herma Ard B, MD  HYDROcodone-acetaminophen (NORCO) 5-325 MG tablet Take 1 tablet by mouth every 6 (six) hours as needed for severe pain. 02/20/17   Darel Hong, MD  hydrOXYzine (ATARAX/VISTARIL) 50 MG tablet Take 50 mg by mouth every 6 (six) hours as needed.    [provider]  isosorbide mononitrate (IMDUR) 30 MG 24 hr tablet Take 1 tablet (30 mg total) by mouth daily. 12/30/16   Pucilowska, Jolanta B, MD  lidocaine (LIDODERM) 5 % Place 1 patch onto the skin every 12 (twelve) hours. Remove & Discard patch within 12 hours or as directed by MD Patient not taking: Reported on 02/17/2017 01/19/17 01/19/18  Laban Emperor, PA-C  LORazepam (ATIVAN) 0.5 MG tablet Take 1 tablet (0.5 mg total) by mouth every 8 (eight) hours as needed for anxiety. 02/17/17 02/17/18  Harvest Dark, MD  Melatonin 3 MG TABS Take 3 mg by mouth at bedtime.    [provider]  Multiple Vitamin (MULTIVITAMIN) tablet Take 1 tablet by mouth daily.    [provider]  nitroGLYCERIN (NITROSTAT) 0.4 MG SL tablet Place 1 tablet (0.4 mg total) under the tongue every 5 (five) minutes x 3 doses as needed for chest pain. 12/30/16   Pucilowska, [place]a Ard B, MD  omeprazole (PRILOSEC) 20 MG capsule Take 1  capsule (20 mg total) by mouth daily. Patient taking differently: Take 40 mg by mouth daily.  12/30/16   Pucilowska, Jolanta B, MD  prazosin (MINIPRESS) 1 MG capsule Take 1 capsule (1 mg total) by mouth at bedtime. 12/30/16   Pucilowska, Herma Ard B, MD  QUEtiapine (SEROQUEL) 200 MG tablet Take 1 tablet (200 mg total) by mouth at bedtime. Patient not taking: Reported on 02/17/2017 12/30/16   Pucilowska, Herma Ard B, MD  rOPINIRole (REQUIP) 0.5 MG tablet TAKE 1 TO 2 TABLETS BY MOUTH QHS AS NEEDED FOR RESTLESS LEGS. 11/16/16   [provider]  tamsulosin (FLOMAX) 0.4 MG CAPS capsule Take 1 capsule (0.4 mg total) by mouth daily. 12/30/16   Pucilowska, Herma Ard B, MD  ticagrelor (BRILINTA) 90 MG TABS tablet Take 1 tablet (90 mg total) by mouth 2 (two) times daily. 12/30/16   Pucilowska, Herma Ard B, MD  traMADol (ULTRAM) 50 MG tablet Take 50 mg by mouth every 6 (six) hours as needed.    [provider]  varenicline (CHANTIX) 1 MG tablet  Take 1 mg by mouth 2 (two) times daily.    [provider]    Allergies Anette Guarneri [lurasidone hcl]; Naproxen; and Saphris [asenapine]  Family History  Problem Relation Age of Onset  . Osteoarthritis Mother   . Heart disease Mother   . Hypertension Mother   . Heart failure Father   . Heart disease Father   . Early death Father   . Hypertension Father   . Hypertension Sister   . Prostate cancer Neg Hx   . Bladder Cancer Neg Hx   . Kidney cancer Neg Hx     Social History Social History  Substance Use Topics  . Smoking status: Current Every Day Smoker    Packs/day: 0.50    Types: Cigarettes    Last attempt to quit: 03/04/2012  . Smokeless tobacco: Never Used  . Alcohol use No    Review of Systems Constitutional: No fever/chills Eyes: No visual changes. ENT: No sore throat. Cardiovascular: Denies chest pain. Respiratory: Denies shortness of breath. Gastrointestinal: Positive abdominal pain.  Positive nausea, no vomiting.  No diarrhea.  No  constipation. Genitourinary: Negative for dysuria. Musculoskeletal: Negative for back pain. Skin: Negative for rash. Neurological: Negative for headaches, focal weakness or numbness.   ____________________________________________   PHYSICAL EXAM:  VITAL SIGNS: ED Triage Vitals  Enc Vitals Group     BP 02/20/17 0359 (!) 146/76     Pulse Rate 02/20/17 0359 (!) 18     Resp 02/20/17 0359 17     Temp 02/20/17 0359 98.1 F (36.7 C)     Temp Source 02/20/17 0359 Oral     SpO2 02/20/17 0359 98 %     Weight 02/20/17 0400 205 lb (93 kg)     Height 02/20/17 0400 6\' 3"  (1.905 m)     Head Circumference --      Peak Flow --      Pain Score 02/20/17 0359 10     Pain Loc --      Pain Edu? --      Excl. in [place]? --     Constitutional: Alert and oriented 4 appears extremely uncomfortable intermittently moaning and grimacing in pain Eyes: PERRL EOMI. Head: Atraumatic. Nose: No congestion/rhinnorhea. Mouth/Throat: No trismus Neck: No stridor.   Cardiovascular: Normal rate, regular rhythm. Grossly normal heart sounds.  Good peripheral circulation. Respiratory: Normal respiratory effort.  No retractions. Lungs CTAB and moving good air Gastrointestinal: Soft abdomen umbilical hernia with large defect easily reduced no skin changes lower abdominal tenderness with rebound no frank peritonitis Musculoskeletal: No lower extremity edema   Neurologic:  Normal speech and language. No gross focal neurologic deficits are appreciated. Skin:  Skin is warm, dry and intact. No rash noted. Psychiatric: Mood and affect are normal. Speech and behavior are normal.    ____________________________________________   DIFFERENTIAL includes but not limited to  Endocet's, diverticulitis, small bowel resection, large bowel obstruction, volvulus ____________________________________________   LABS (all labs ordered are listed, but only abnormal results are displayed)  Labs Reviewed  BASIC METABOLIC PANEL -  Abnormal; Notable for the following:       Result Value   Glucose, Bld 123 (*)    All other components within normal limits  CBC WITH DIFFERENTIAL/PLATELET - Abnormal; Notable for the following:    WBC 10.9 (*)    Neutro Abs 8.4 (*)    All other components within normal limits  HEPATIC FUNCTION PANEL  LIPASE, BLOOD  TROPONIN I  LACTIC ACID, PLASMA  LACTIC  ACID, PLASMA  URINALYSIS, COMPLETE (UACMP) WITH MICROSCOPIC    Elevated white count is nonspecific and could be secondary to pain __________________________________________  EKG   ____________________________________________  RADIOLOGY  CT scan shows no obstruction and shows small bowel enteritis ____________________________________________   PROCEDURES  Procedure(s) performed: no  Procedures  Critical Care performed: no  Observation: no ____________________________________________   INITIAL IMPRESSION / ASSESSMENT AND PLAN / ED COURSE  Pertinent labs & imaging results that were available during my care of the patient were reviewed by me and considered in my medical decision making (see chart for details).  The patient arrives hemodynamically stable although with exquisite tenderness in his lower abdomen and intermittent attacks of pain. Differential is broad but I'm concerned about a surgical intra-abdominal process. Labs and CT scan are pending.  The patient's pain is nearly resolved after intravenous fentanyl. CT scan shows nonspecific distal small bowel enteritis. Unclear etiology although this is likely viral I think he warrants treatment with 3 days of ciprofloxacin in case it is a bacterial infection. Strict return precautions given and he is medically stable for outpatient management.      ____________________________________________   FINAL CLINICAL IMPRESSION(S) / ED DIAGNOSES  Final diagnoses:  Enteritis      NEW MEDICATIONS STARTED DURING THIS VISIT:  Discharge Medication List as of  02/20/2017  6:24 AM    START taking these medications   Details  ciprofloxacin (CIPRO) 500 MG tablet Take 1 tablet (500 mg total) by mouth 2 (two) times daily., Starting Mon 02/20/2017, Until Thu 02/23/2017, Print    HYDROcodone-acetaminophen (NORCO) 5-325 MG tablet Take 1 tablet by mouth every 6 (six) hours as needed for severe pain., Starting Mon 02/20/2017, Print         Note:  This document was prepared using Dragon voice recognition software and may include unintentional dictation errors.     Darel Hong, MD 02/20/17 [phone]

## 2017-02-20 NOTE — ED Notes (Signed)
Dr Mable Paris at bedside

## 2017-02-20 NOTE — Discharge Instructions (Signed)
Please take all of your antibiotics as prescribed and follow-up with your primary care physician as needed. Return to the emergency department for any new or worsening symptoms which is worsening pain, fevers, chills, if you cannot eat or drink, or for any other concerns.  It was a pleasure to take care of you today, and thank you for coming to our emergency department.  If you have any questions or concerns before leaving please ask the nurse to grab me and I'm more than happy to go through your aftercare instructions again.  If you were prescribed any opioid pain medication today such as Norco, Vicodin, Percocet, morphine, hydrocodone, or oxycodone please make sure you do not drive when you are taking this medication as it can alter your ability to drive safely.  If you have any concerns once you are home that you are not improving or are in fact getting worse before you can make it to your follow-up appointment, please do not hesitate to call [phone] and come back for further evaluation.  Darel Hong, MD  Results for orders placed or performed during the hospital encounter of 32/35/57  Basic metabolic panel  Result Value Ref Range   Sodium 138 135 - 145 mmol/L   Potassium 4.2 3.5 - 5.1 mmol/L   Chloride 107 101 - 111 mmol/L   CO2 22 22 - 32 mmol/L   Glucose, Bld 123 (H) 65 - 99 mg/dL   BUN 17 6 - 20 mg/dL   Creatinine, Ser 1.18 0.61 - 1.24 mg/dL   Calcium 9.7 8.9 - 10.3 mg/dL   GFR calc non Af Amer >60 >60 mL/min   GFR calc Af Amer >60 >60 mL/min   Anion gap 9 5 - 15  Hepatic function panel  Result Value Ref Range   Total Protein 7.9 6.5 - 8.1 g/dL   Albumin 4.9 3.5 - 5.0 g/dL   AST 27 15 - 41 U/L   ALT 31 17 - 63 U/L   Alkaline Phosphatase 58 38 - 126 U/L   Total Bilirubin 0.8 0.3 - 1.2 mg/dL   Bilirubin, Direct 0.1 0.1 - 0.5 mg/dL   Indirect Bilirubin 0.7 0.3 - 0.9 mg/dL  Lipase, blood  Result Value Ref Range   Lipase 45 11 - 51 U/L  Troponin I  Result Value Ref Range   Troponin I <0.03 <0.03 ng/mL  Lactic acid, plasma  Result Value Ref Range   Lactic Acid, Venous 1.2 0.5 - 1.9 mmol/L  CBC with Differential  Result Value Ref Range   WBC 10.9 (H) 3.8 - 10.6 K/uL   RBC 5.30 4.40 - 5.90 MIL/uL   Hemoglobin 17.3 13.0 - 18.0 g/dL   HCT 49.2 40.0 - 52.0 %   MCV 92.9 80.0 - 100.0 fL   MCH 32.7 26.0 - 34.0 pg   MCHC 35.3 32.0 - 36.0 g/dL   RDW 13.6 11.5 - 14.5 %   Platelets 177 150 - 440 K/uL   Neutrophils Relative % 78 %   Neutro Abs 8.4 (H) 1.4 - 6.5 K/uL   Lymphocytes Relative 15 %   Lymphs Abs 1.6 1.0 - 3.6 K/uL   Monocytes Relative 5 %   Monocytes Absolute 0.6 0.2 - 1.0 K/uL   Eosinophils Relative 2 %   Eosinophils Absolute 0.2 0 - 0.7 K/uL   Basophils Relative 0 %   Basophils Absolute 0.0 0 - 0.1 K/uL   Ct Abdomen Pelvis W Contrast  Result Date: 02/20/2017 CLINICAL DATA:  Lower  abdominal pain and vomiting. EXAM: CT ABDOMEN AND PELVIS WITH CONTRAST TECHNIQUE: Multidetector CT imaging of the abdomen and pelvis was performed using the standard protocol following bolus administration of intravenous contrast. CONTRAST:  174mL ISOVUE-300 IOPAMIDOL (ISOVUE-300) INJECTION 61% COMPARISON:  MRI abdomen 09/21/2016.  CT abdomen 09/21/2016. FINDINGS: Lower chest: Lung bases are clear. Hepatobiliary: Changes of hepatic cirrhosis with enlarged lateral segment left lobe and mild nodularity to the hepatic contour. Cyst in the right lobe of the liver is unchanged since previous studies. No new focal lesions. Gallbladder and bile ducts are unremarkable. Pancreas: Unremarkable. No pancreatic ductal dilatation or surrounding inflammatory changes. Spleen: Normal in size without focal abnormality. Adrenals/Urinary Tract: Adrenal glands are unremarkable. Kidneys are normal, without renal calculi, focal lesion, or hydronephrosis. Bladder is unremarkable. Stomach/Bowel: Stomach is decompressed. Mildly dilated fluid-filled distal small bowel. Diffusely stool-filled colon. No bowel  wall thickening is appreciated. Changes may be due to enteritis. Appendix is not identified. Vascular/Lymphatic: Calcification of the aorta without aneurysm. Scattered retroperitoneal and celiac axis lymph nodes are present without pathologic enlargement. These may be reactive or related to cirrhosis. Reproductive: Prostate gland is enlarged, measuring 5.6 cm diameter. Other: Minimal free fluid in the right pericolic gutter, low pelvis, and around the inferior liver probably representing mild ascites. No free air in the abdomen. Abdominal wall musculature appears intact. Musculoskeletal: Postoperative changes with posterior fixation of L4 to the sacrum. Intervertebral disc prosthesis at L4-5 and L5-S1 levels. Degenerative changes in the lumbar spine. No destructive bone lesions. IMPRESSION: 1. Changes of hepatic cirrhosis with mild abdominal and pelvic ascites. 2. Fluid-filled mildly dilated distal small bowel. Fluid and stool in the colon. Changes may represent enteritis. No bowel wall thickening is identified. 3. Prostate enlargement. Electronically Signed   By: Lucienne Capers M.D.   On: 02/20/2017 06:03

## 2017-02-20 NOTE — ED Notes (Signed)
Pt using call bell; requesting something to drink; understands nothing to eat or drink until CT has been performed and results are back; verbalized understanding

## 2017-03-04 ENCOUNTER — Emergency Department: Payer: Medicaid Other

## 2017-03-04 ENCOUNTER — Emergency Department
Admission: EM | Admit: 2017-03-04 | Discharge: 2017-03-04 | Disposition: A | Discharge status: Home / Self Care | Payer: Medicaid Other | Attending: Emergency Medicine | Admitting: Emergency Medicine

## 2017-03-04 ENCOUNTER — Encounter: Payer: Self-pay | Admitting: Emergency Medicine

## 2017-03-04 DIAGNOSIS — F17210 Nicotine dependence, cigarettes, uncomplicated: Secondary | ICD-10-CM | POA: Insufficient documentation

## 2017-03-04 DIAGNOSIS — M25561 Pain in right knee: Secondary | ICD-10-CM | POA: Diagnosis not present

## 2017-03-04 DIAGNOSIS — I25118 Atherosclerotic heart disease of native coronary artery with other forms of angina pectoris: Secondary | ICD-10-CM | POA: Insufficient documentation

## 2017-03-04 DIAGNOSIS — Z7982 Long term (current) use of aspirin: Secondary | ICD-10-CM | POA: Insufficient documentation

## 2017-03-04 DIAGNOSIS — I252 Old myocardial infarction: Secondary | ICD-10-CM | POA: Diagnosis not present

## 2017-03-04 DIAGNOSIS — J449 Chronic obstructive pulmonary disease, unspecified: Secondary | ICD-10-CM | POA: Diagnosis not present

## 2017-03-04 DIAGNOSIS — M1990 Unspecified osteoarthritis, unspecified site: Secondary | ICD-10-CM | POA: Insufficient documentation

## 2017-03-04 DIAGNOSIS — Z79899 Other long term (current) drug therapy: Secondary | ICD-10-CM | POA: Diagnosis not present

## 2017-03-04 DIAGNOSIS — R52 Pain, unspecified: Secondary | ICD-10-CM

## 2017-03-04 HISTORY — DX: Cerebral infarction, unspecified: I63.9

## 2017-03-04 HISTORY — DX: Acute myocardial infarction, unspecified: I21.9

## 2017-03-04 MED ORDER — MELOXICAM 7.5 MG PO TABS: 7.5000 mg | ORAL_TABLET | Freq: Every day | ORAL | 1 refills | Status: AC

## 2017-03-04 MED ORDER — ACETAMINOPHEN 325 MG PO TABS
650.0000 mg | ORAL_TABLET | Freq: Once | ORAL | Status: AC
  Administered 2017-03-04: 650 mg via ORAL
  Filled 2017-03-04: qty 2

## 2017-03-04 NOTE — ED Provider Notes (Signed)
[facility] Emergency Department Provider Note  ____________________________________________  Time seen: Approximately 4:13 PM  I have reviewed the triage vital signs and the nursing notes.   HISTORY  Chief Complaint Knee Pain    HPI Keith Gibbs is a 56 y.o. male presenting to the emergency department 7 out of 10 nonradiating right knee pain for the past several days. Patient states that he was working on his car when he pivoted and heard a pop. Patient denies weakness. He has been able to ambulate without difficulty. Patient's pain is worsened with flexion at the knee and alleviated with rest.   Past Medical History:  Diagnosis Date  . Anxiety   . Arthritis   . Bursitis   . COPD (chronic obstructive pulmonary disease) ([place])   . Depression   . Hepatitis C 2012   No longer has Hep C  . MI (myocardial infarction) ([place])   . Stroke [facility])     Patient Active Problem List   Diagnosis Date Noted  . Overdose of medication, intentional self-harm, subsequent encounter 12/28/2016  . Tobacco use disorder 12/28/2016  . Severe recurrent major depression without psychotic features ([place]) 12/27/2016  . Coronary artery disease 12/27/2016  . Alcohol use disorder, moderate, dependence ([place]) 12/27/2016  . Cocaine use disorder, moderate, dependence ([place]) 12/27/2016  . Cannabis use disorder, moderate, dependence ([place]) 12/27/2016  . Angina at rest [facility]) 06/11/2016  . Chest pain 06/10/2016  . Bipolar 1 disorder ([place]) 05/27/2013  . Myalgia and myositis 05/27/2013  . Abnormal ejaculation 11/30/2012  . Chest pain, unspecified 11/30/2012  . Degenerative arthritis of hip 11/16/2012  . Hip pain, left 08/29/2012  . Swollen testicle 07/27/2012  . Schizophrenia ([place]) 06/27/2012  . Osteoarthritis 06/27/2012  . Shoulder pain 06/27/2012  . COPD (chronic obstructive pulmonary disease) ([place]) 06/27/2012  . Chronic hepatitis C ([place]) 06/27/2012  . GERD (gastroesophageal reflux  disease) 06/27/2012  . Complete rupture of rotator cuff 02/10/2012  . Cocaine abuse in remission 12/05/2011    Past Surgical History:  Procedure Laterality Date  . ABDOMINAL SURGERY     Intestines  . APPENDECTOMY    . BACK SURGERY    . CARDIAC CATHETERIZATION Left 06/10/2016   Procedure: Left Heart Cath and Coronary Angiography;  Surgeon: Dionisio David, MD;  Location: [place] CV LAB;  Service: Cardiovascular;  Laterality: Left;  . CARDIAC CATHETERIZATION N/A 06/10/2016   Procedure: Coronary Stent Intervention;  Surgeon: Yolonda Kida, MD;  Location: [place] CV LAB;  Service: Cardiovascular;  Laterality: N/A;  . COLONOSCOPY    . COLONOSCOPY WITH PROPOFOL N/A 01/05/2017   Procedure: COLONOSCOPY WITH PROPOFOL;  Surgeon: Jonathon Bellows, MD;  Location: [facility] ENDOSCOPY;  Service: Endoscopy;  Laterality: N/A;  . CORONARY ANGIOPLASTY WITH STENT PLACEMENT    . ESOPHAGOGASTRODUODENOSCOPY (EGD) WITH PROPOFOL N/A 01/05/2017   Procedure: ESOPHAGOGASTRODUODENOSCOPY (EGD) WITH PROPOFOL;  Surgeon: Jonathon Bellows, MD;  Location[facility] ENDOSCOPY;  Service: Endoscopy;  Laterality: N/A;  . SHOULDER SURGERY  04/09/2012    Prior to Admission medications   Medication Sig Start Date End Date Taking? Authorizing Provider  acetaminophen (TYLENOL) 500 MG tablet Take 2 tablets (1,000 mg total) by mouth every 6 (six) hours as needed. For pain. 12/30/16   Pucilowska, Wardell Honour, MD  albuterol (PROAIR HFA) 108 (90 Base) MCG/ACT inhaler Inhale 2 puffs into the lungs every 6 (six) hours as needed for wheezing or shortness of breath. 12/30/16   Pucilowska, Wardell Honour, MD  aspirin EC 81 MG tablet  Take 1 tablet (81 mg total) by mouth daily. 12/30/16   Pucilowska, Herma Ard B, MD  atorvastatin (LIPITOR) 40 MG tablet Take 1 tablet (40 mg total) by mouth daily at 6 PM. 12/30/16   Pucilowska, Jolanta B, MD  diphenhydrAMINE (BENADRYL) 25 mg capsule Take 25 mg by mouth daily.    [provider]  DULERA 200-5 MCG/ACT AERO  TAKE 2 PUFFS BY MOUTH TWICE A DAY 12/30/16   Pucilowska, Jolanta B, MD  fluvoxaMINE (LUVOX) 100 MG tablet Take 1 tablet (100 mg total) by mouth at bedtime. 12/30/16   Pucilowska, Herma Ard B, MD  HYDROcodone-acetaminophen (NORCO) 5-325 MG tablet Take 1 tablet by mouth every 6 (six) hours as needed for severe pain. 02/20/17   Darel Hong, MD  hydrOXYzine (ATARAX/VISTARIL) 50 MG tablet Take 50 mg by mouth every 6 (six) hours as needed.    [provider]  isosorbide mononitrate (IMDUR) 30 MG 24 hr tablet Take 1 tablet (30 mg total) by mouth daily. 12/30/16   Pucilowska, Jolanta B, MD  lidocaine (LIDODERM) 5 % Place 1 patch onto the skin every 12 (twelve) hours. Remove & Discard patch within 12 hours or as directed by MD Patient not taking: Reported on 02/17/2017 01/19/17 01/19/18  Laban Emperor, PA-C  LORazepam (ATIVAN) 0.5 MG tablet Take 1 tablet (0.5 mg total) by mouth every 8 (eight) hours as needed for anxiety. 02/17/17 02/17/18  Harvest Dark, MD  Melatonin 3 MG TABS Take 3 mg by mouth at bedtime.    [provider]  Multiple Vitamin (MULTIVITAMIN) tablet Take 1 tablet by mouth daily.    [provider]  nitroGLYCERIN (NITROSTAT) 0.4 MG SL tablet Place 1 tablet (0.4 mg total) under the tongue every 5 (five) minutes x 3 doses as needed for chest pain. 12/30/16   Pucilowska, Herma Ard B, MD  omeprazole (PRILOSEC) 20 MG capsule Take 1 capsule (20 mg total) by mouth daily. Patient taking differently: Take 40 mg by mouth daily.  12/30/16   Pucilowska, Jolanta B, MD  prazosin (MINIPRESS) 1 MG capsule Take 1 capsule (1 mg total) by mouth at bedtime. 12/30/16   Pucilowska, Herma Ard B, MD  QUEtiapine (SEROQUEL) 200 MG tablet Take 1 tablet (200 mg total) by mouth at bedtime. Patient not taking: Reported on 02/17/2017 12/30/16   Pucilowska, Herma Ard B, MD  rOPINIRole (REQUIP) 0.5 MG tablet TAKE 1 TO 2 TABLETS BY MOUTH QHS AS NEEDED FOR RESTLESS LEGS. 11/16/16   [provider]  tamsulosin  (FLOMAX) 0.4 MG CAPS capsule Take 1 capsule (0.4 mg total) by mouth daily. 12/30/16   Pucilowska, Herma Ard B, MD  ticagrelor (BRILINTA) 90 MG TABS tablet Take 1 tablet (90 mg total) by mouth 2 (two) times daily. 12/30/16   Pucilowska, Herma Ard B, MD  traMADol (ULTRAM) 50 MG tablet Take 50 mg by mouth every 6 (six) hours as needed.    [provider]  varenicline (CHANTIX) 1 MG tablet Take 1 mg by mouth 2 (two) times daily.    [provider]    Allergies Anette Guarneri [lurasidone hcl]; Naproxen; and Saphris [asenapine]  Family History  Problem Relation Age of Onset  . Osteoarthritis Mother   . Heart disease Mother   . Hypertension Mother   . Heart failure Father   . Heart disease Father   . Early death Father   . Hypertension Father   . Hypertension Sister   . Prostate cancer Neg Hx   . Bladder Cancer Neg Hx   . Kidney cancer Neg  Hx     Social History Social History  Substance Use Topics  . Smoking status: Current Every Day Smoker    Packs/day: 0.50    Types: Cigarettes    Last attempt to quit: 03/04/2012  . Smokeless tobacco: Never Used  . Alcohol use No     Review of Systems  Constitutional: No fever/chills Eyes: No visual changes. No discharge ENT: No upper respiratory complaints. Cardiovascular: no chest pain. Respiratory: no cough. No SOB. Musculoskeletal: Patient has right knee pain. Skin: Negative for rash, abrasions, lacerations, ecchymosis. Neurological: Negative for headaches, focal weakness or numbness.   ____________________________________________   PHYSICAL EXAM:  VITAL SIGNS: ED Triage Vitals [03/04/17 1458]  Enc Vitals Group     BP (!) 103/57     Pulse Rate 63     Resp 18     Temp 98.2 F (36.8 C)     Temp Source Oral     SpO2 95 %     Weight 221 lb (100.2 kg)     Height 6\' 3"  (1.905 m)     Head Circumference      Peak Flow      Pain Score 10     Pain Loc      Pain Edu?      Excl. in [place]?      Constitutional: Alert and  oriented. Well appearing and in no acute distress. Eyes: Conjunctivae are normal. PERRL. EOMI. Head: Atraumatic. Cardiovascular: Normal rate, regular rhythm. Normal S1 and S2.  Good peripheral circulation. Respiratory: Normal respiratory effort without tachypnea or retractions. Lungs CTAB. Good air entry to the bases with no decreased or absent breath sounds. Musculoskeletal: Patient has 5 out of 5 strength of the lower extremities bilaterally. Patient is able to perform limited flexion and extension at the right knee, likely secondary to pain. Patient has pain with palpation over the insertion for the quadriceps tendon. He also has palpable fullness along the popliteal space. Negative anterior and posterior drawer test, right. No laxity of the MCL or LCL testing, right. Negative ballottement, right. Neurologic:  Normal speech and language. No gross focal neurologic deficits are appreciated.  Skin:  Skin is warm, dry and intact. No rash noted. Psychiatric: Mood and affect are normal. Speech and behavior are normal. Patient exhibits appropriate insight and judgement.   ____________________________________________   LABS (all labs ordered are listed, but only abnormal results are displayed)  Labs Reviewed - No data to display ____________________________________________  EKG   ____________________________________________  RADIOLOGY Unk Pinto, personally viewed and evaluated these images (plain radiographs) as part of my medical decision making, as well as reviewing the written report by the radiologist.  Dg Knee Complete 4 Views Right  Result Date: 03/04/2017 CLINICAL DATA:  Right knee pain. Rotational injury. Swelling. Pain is worse with bending. EXAM: RIGHT KNEE - COMPLETE 4+ VIEW COMPARISON:  Right knee radiographs 01/30/2014 FINDINGS: A large joint effusion is present. The knee is located. No acute fractures are present. IMPRESSION: 1. Large joint effusion. 2. No acute osseous  abnormality. Electronically Signed   By: San Morelle M.D.   On: 03/04/2017 16:02    ____________________________________________    PROCEDURES  Procedure(s) performed:    Procedures    Medications - No data to display   ____________________________________________   INITIAL IMPRESSION / ASSESSMENT AND PLAN / ED COURSE  Pertinent labs & imaging results that were available during my care of the patient were reviewed by me and considered in my medical  decision making (see chart for details).  Review of the [place] CSRS was performed in accordance of the [place] prior to dispensing any controlled drugs.    Assessment and plan Right knee pain Patient presents to the emergency department with right knee pain that worsened after he was working on his car today. DG right knee reveals a large joint effusion. Patient was given Tylenol in the emergency department. A knee immobilizer was applied. Patient was referred to orthopedics, Dr. Roland Rack. All patient questions were answered.   ____________________________________________  FINAL CLINICAL IMPRESSION(S) / ED DIAGNOSES  Final diagnoses:  Pain      NEW MEDICATIONS STARTED DURING THIS VISIT:  New Prescriptions   No medications on file        This chart was dictated using voice recognition software/Dragon. Despite best efforts to proofread, errors can occur which can change the meaning. Any change was purely unintentional.    Lannie Fields, PA-C 03/04/17 1625    Delman Kitten, MD 03/04/17 [phone]

## 2017-03-04 NOTE — ED Triage Notes (Signed)
Pt c/o left knee pain for couple days. Started swelling today. He was working on car today and heard a pop in left knee and reports increase in swelling.  Pain worst when knee is bent and relieved with extension.

## 2017-03-07 ENCOUNTER — Ambulatory Visit: Payer: Medicaid Other | Admitting: Urology

## 2017-03-08 ENCOUNTER — Encounter: Payer: Self-pay | Admitting: Urology

## 2017-03-14 ENCOUNTER — Encounter: Payer: Self-pay | Admitting: Emergency Medicine

## 2017-03-14 ENCOUNTER — Emergency Department
Admission: EM | Admit: 2017-03-14 | Discharge: 2017-03-14 | Disposition: A | Discharge status: Home / Self Care | Payer: Medicaid Other | Attending: Emergency Medicine | Admitting: Emergency Medicine

## 2017-03-14 DIAGNOSIS — Z79899 Other long term (current) drug therapy: Secondary | ICD-10-CM | POA: Insufficient documentation

## 2017-03-14 DIAGNOSIS — Y929 Unspecified place or not applicable: Secondary | ICD-10-CM | POA: Insufficient documentation

## 2017-03-14 DIAGNOSIS — R1031 Right lower quadrant pain: Secondary | ICD-10-CM | POA: Insufficient documentation

## 2017-03-14 DIAGNOSIS — M62830 Muscle spasm of back: Secondary | ICD-10-CM | POA: Diagnosis not present

## 2017-03-14 DIAGNOSIS — S39012A Strain of muscle, fascia and tendon of lower back, initial encounter: Secondary | ICD-10-CM | POA: Diagnosis not present

## 2017-03-14 DIAGNOSIS — Z7982 Long term (current) use of aspirin: Secondary | ICD-10-CM | POA: Diagnosis not present

## 2017-03-14 DIAGNOSIS — Z9861 Coronary angioplasty status: Secondary | ICD-10-CM | POA: Diagnosis not present

## 2017-03-14 DIAGNOSIS — I2510 Atherosclerotic heart disease of native coronary artery without angina pectoris: Secondary | ICD-10-CM | POA: Diagnosis not present

## 2017-03-14 DIAGNOSIS — F17210 Nicotine dependence, cigarettes, uncomplicated: Secondary | ICD-10-CM | POA: Insufficient documentation

## 2017-03-14 DIAGNOSIS — Y9389 Activity, other specified: Secondary | ICD-10-CM | POA: Diagnosis not present

## 2017-03-14 DIAGNOSIS — J449 Chronic obstructive pulmonary disease, unspecified: Secondary | ICD-10-CM | POA: Diagnosis not present

## 2017-03-14 DIAGNOSIS — I252 Old myocardial infarction: Secondary | ICD-10-CM | POA: Diagnosis not present

## 2017-03-14 DIAGNOSIS — X500XXA Overexertion from strenuous movement or load, initial encounter: Secondary | ICD-10-CM | POA: Diagnosis not present

## 2017-03-14 DIAGNOSIS — Y999 Unspecified external cause status: Secondary | ICD-10-CM | POA: Insufficient documentation

## 2017-03-14 DIAGNOSIS — S3992XA Unspecified injury of lower back, initial encounter: Secondary | ICD-10-CM | POA: Diagnosis present

## 2017-03-14 MED ORDER — ORPHENADRINE CITRATE 30 MG/ML IJ SOLN
60.0000 mg | Freq: Two times a day (BID) | INTRAMUSCULAR | Status: DC
  Administered 2017-03-14: 60 mg via INTRAMUSCULAR
  Filled 2017-03-14: qty 2

## 2017-03-14 MED ORDER — DEXAMETHASONE SODIUM PHOSPHATE 10 MG/ML IJ SOLN
10.0000 mg | Freq: Once | INTRAMUSCULAR | Status: AC
  Administered 2017-03-14: 10 mg via INTRAMUSCULAR
  Filled 2017-03-14: qty 1

## 2017-03-14 MED ORDER — CYCLOBENZAPRINE HCL 10 MG PO TABS: 10.0000 mg | ORAL_TABLET | Freq: Three times a day (TID) | ORAL | 0 refills | Status: DC | PRN

## 2017-03-14 MED ORDER — PREDNISONE 10 MG (21) PO TBPK: ORAL_TABLET | ORAL | 0 refills | Status: DC

## 2017-03-14 MED ORDER — OXYCODONE-ACETAMINOPHEN 5-325 MG PO TABS
1.0000 | ORAL_TABLET | Freq: Once | ORAL | Status: AC
Start: 2017-03-14 — End: 2017-03-14
  Administered 2017-03-14: 1 via ORAL
  Filled 2017-03-14: qty 1

## 2017-03-14 MED ORDER — OXYCODONE-ACETAMINOPHEN 5-325 MG PO TABS: 1.0000 | ORAL_TABLET | Freq: Four times a day (QID) | ORAL | 0 refills | Status: DC | PRN

## 2017-03-14 NOTE — ED Provider Notes (Signed)
[facility] Emergency Department Provider Note   ____________________________________________   I have reviewed the triage vital signs and the nursing notes.   HISTORY  Chief Complaint Back Pain    HPI Keith Gibbs is a 56 y.o. male presents to emergency department with lumbar back pain and right groin pain that started them easily following lifting injury 2 days ago. Patient reported lifting a heavy piece of equipment. While attempting to catch the machine from slipping, he felt a popping his back and a pulling sensation in his groin region. Patient has tried conservative measures to include over-the-counter NSAIDs and heat without improvement. Patient denies radicular symptoms, bowel/bladder dysfunction or saddle anesthesia. Patient denies fever, chills, headache, vision changes, chest pain, chest tightness, shortness of breath, abdominal pain, nausea and vomiting.  Past Medical History:  Diagnosis Date  . Anxiety   . Arthritis   . Bursitis   . COPD (chronic obstructive pulmonary disease) ([place])   . Depression   . Hepatitis C 2012   No longer has Hep C  . MI (myocardial infarction) ([place])   . Stroke [facility])     Patient Active Problem List   Diagnosis Date Noted  . Overdose of medication, intentional self-harm, subsequent encounter 12/28/2016  . Tobacco use disorder 12/28/2016  . Severe recurrent major depression without psychotic features ([place]) 12/27/2016  . Coronary artery disease 12/27/2016  . Alcohol use disorder, moderate, dependence ([place]) 12/27/2016  . Cocaine use disorder, moderate, dependence ([place]) 12/27/2016  . Cannabis use disorder, moderate, dependence ([place]) 12/27/2016  . Angina at rest [facility]) 06/11/2016  . Chest pain 06/10/2016  . Bipolar 1 disorder ([place]) 05/27/2013  . Myalgia and myositis 05/27/2013  . Abnormal ejaculation 11/30/2012  . Chest pain, unspecified 11/30/2012  . Degenerative arthritis of hip 11/16/2012  . Hip pain, left  08/29/2012  . Swollen testicle 07/27/2012  . Schizophrenia ([place]) 06/27/2012  . Osteoarthritis 06/27/2012  . Shoulder pain 06/27/2012  . COPD (chronic obstructive pulmonary disease) ([place]) 06/27/2012  . Chronic hepatitis C ([place]) 06/27/2012  . GERD (gastroesophageal reflux disease) 06/27/2012  . Complete rupture of rotator cuff 02/10/2012  . Cocaine abuse in remission 12/05/2011    Past Surgical History:  Procedure Laterality Date  . ABDOMINAL SURGERY     Intestines  . APPENDECTOMY    . BACK SURGERY    . CARDIAC CATHETERIZATION Left 06/10/2016   Procedure: Left Heart Cath and Coronary Angiography;  Surgeon: Dionisio David, MD;  Location: [facility];  Service: Cardiovascular;  Laterality: Left;  . CARDIAC CATHETERIZATION N/A 06/10/2016   Procedure: Coronary Stent Intervention;  Surgeon: Yolonda Kida, MD;  Location: [place];  Service: Cardiovascular;  Laterality: N/A;  . COLONOSCOPY    . COLONOSCOPY WITH PROPOFOL N/A 01/05/2017   Procedure: COLONOSCOPY WITH PROPOFOL;  Surgeon: Jonathon Bellows, MD;  Location: [facility] ENDOSCOPY;  Service: Endoscopy;  Laterality: N/A;  . CORONARY ANGIOPLASTY WITH STENT PLACEMENT    . ESOPHAGOGASTRODUODENOSCOPY (EGD) WITH PROPOFOL N/A 01/05/2017   Procedure: ESOPHAGOGASTRODUODENOSCOPY (EGD) WITH PROPOFOL;  Surgeon: Jonathon Bellows, MD;  Location: [facility] ENDOSCOPY;  Service: Endoscopy;  Laterality: N/A;  . SHOULDER SURGERY  04/09/2012    Prior to Admission medications   Medication Sig Start Date End Date Taking? Authorizing Provider  acetaminophen (TYLENOL) 500 MG tablet Take 2 tablets (1,000 mg total) by mouth every 6 (six) hours as needed. For pain. 12/30/16   Pucilowska, Wardell Honour, MD  albuterol (PROAIR HFA) 108 (90 Base) MCG/ACT inhaler  Inhale 2 puffs into the lungs every 6 (six) hours as needed for wheezing or shortness of breath. 12/30/16   Pucilowska, Herma Ard B, MD  aspirin EC 81 MG tablet Take 1 tablet (81 mg total) by mouth daily. 12/30/16    Pucilowska, Herma Ard B, MD  atorvastatin (LIPITOR) 40 MG tablet Take 1 tablet (40 mg total) by mouth daily at 6 PM. 12/30/16   Pucilowska, Jolanta B, MD  cyclobenzaprine (FLEXERIL) 10 MG tablet Take 1 tablet (10 mg total) by mouth 3 (three) times daily as needed for muscle spasms. 03/14/17   Keturah Yerby M, PA-C  diphenhydrAMINE (BENADRYL) 25 mg capsule Take 25 mg by mouth daily.    [provider]  DULERA 200-5 MCG/ACT AERO TAKE 2 PUFFS BY MOUTH TWICE A DAY 12/30/16   Pucilowska, Jolanta B, MD  fluvoxaMINE (LUVOX) 100 MG tablet Take 1 tablet (100 mg total) by mouth at bedtime. 12/30/16   Pucilowska, Herma Ard B, MD  HYDROcodone-acetaminophen (NORCO) 5-325 MG tablet Take 1 tablet by mouth every 6 (six) hours as needed for severe pain. 02/20/17   Darel Hong, MD  hydrOXYzine (ATARAX/VISTARIL) 50 MG tablet Take 50 mg by mouth every 6 (six) hours as needed.    [provider]  isosorbide mononitrate (IMDUR) 30 MG 24 hr tablet Take 1 tablet (30 mg total) by mouth daily. 12/30/16   Pucilowska, Jolanta B, MD  lidocaine (LIDODERM) 5 % Place 1 patch onto the skin every 12 (twelve) hours. Remove & Discard patch within 12 hours or as directed by MD Patient not taking: Reported on 02/17/2017 01/19/17 01/19/18  Laban Emperor, PA-C  LORazepam (ATIVAN) 0.5 MG tablet Take 1 tablet (0.5 mg total) by mouth every 8 (eight) hours as needed for anxiety. 02/17/17 02/17/18  Harvest Dark, MD  Melatonin 3 MG TABS Take 3 mg by mouth at bedtime.    [provider]  Multiple Vitamin (MULTIVITAMIN) tablet Take 1 tablet by mouth daily.    [provider]  nitroGLYCERIN (NITROSTAT) 0.4 MG SL tablet Place 1 tablet (0.4 mg total) under the tongue every 5 (five) minutes x 3 doses as needed for chest pain. 12/30/16   Pucilowska, Herma Ard B, MD  omeprazole (PRILOSEC) 20 MG capsule Take 1 capsule (20 mg total) by mouth daily. Patient taking differently: Take 40 mg by mouth daily.  12/30/16   Pucilowska, Jolanta  B, MD  oxyCODONE-acetaminophen (ROXICET) 5-325 MG tablet Take 1 tablet by mouth every 6 (six) hours as needed. 03/14/17 03/14/18  Cherylanne Ardelean M, PA-C  prazosin (MINIPRESS) 1 MG capsule Take 1 capsule (1 mg total) by mouth at bedtime. 12/30/16   Pucilowska, Jolanta B, MD  predniSONE (STERAPRED UNI-PAK 21 TAB) 10 MG (21) TBPK tablet Take 6 tablets on day 1. Take 5 tablets on day 2. Take 4 tablets on day 3. Take 3 tablets on day 4. Take 2 tablets on day 5. Take 1 tablets on day 6. 03/14/17   Coury Grieger M, PA-C  QUEtiapine (SEROQUEL) 200 MG tablet Take 1 tablet (200 mg total) by mouth at bedtime. Patient not taking: Reported on 02/17/2017 12/30/16   Pucilowska, Herma Ard B, MD  rOPINIRole (REQUIP) 0.5 MG tablet TAKE 1 TO 2 TABLETS BY MOUTH QHS AS NEEDED FOR RESTLESS LEGS. 11/16/16   [provider]  tamsulosin (FLOMAX) 0.4 MG CAPS capsule Take 1 capsule (0.4 mg total) by mouth daily. 12/30/16   Pucilowska, Herma Ard B, MD  ticagrelor (BRILINTA) 90 MG TABS tablet Take 1 tablet (90 mg total) by  mouth 2 (two) times daily. 12/30/16   Pucilowska, Herma Ard B, MD  traMADol (ULTRAM) 50 MG tablet Take 50 mg by mouth every 6 (six) hours as needed.    [provider]  varenicline (CHANTIX) 1 MG tablet Take 1 mg by mouth 2 (two) times daily.    [provider]    Allergies Anette Guarneri [lurasidone hcl]; Naproxen; and Saphris [asenapine]  Family History  Problem Relation Age of Onset  . Osteoarthritis Mother   . Heart disease Mother   . Hypertension Mother   . Heart failure Father   . Heart disease Father   . Early death Father   . Hypertension Father   . Hypertension Sister   . Prostate cancer Neg Hx   . Bladder Cancer Neg Hx   . Kidney cancer Neg Hx     Social History Social History  Substance Use Topics  . Smoking status: Current Every Day Smoker    Packs/day: 0.50    Types: Cigarettes    Last attempt to quit: 03/04/2012  . Smokeless tobacco: Never Used  . Alcohol use No    Review  of Systems Constitutional: Negative for fever/chills Eyes: No visual changes. ENT:  Negative for sore throat and for difficulty swallowing Cardiovascular: Denies chest pain. Respiratory: Denies cough. Denies shortness of breath. Gastrointestinal: No abdominal pain.  No nausea, vomiting, diarrhea. Genitourinary: Negative for dysuria. Musculoskeletal: Positive for right side back pain and right groin pain.  Skin: Negative for rash. Neurological: Negative for headaches.  Negative focal weakness or numbness. Negative for loss of consciousness. Able to ambulate. ____________________________________________   PHYSICAL EXAM:  VITAL SIGNS: ED Triage Vitals [03/14/17 1331]  Enc Vitals Group     BP 134/64     Pulse Rate 60     Resp 18     Temp 97.8 F (36.6 C)     Temp Source Oral     SpO2 100 %     Weight 221 lb (100.2 kg)     Height      Head Circumference      Peak Flow      Pain Score 10     Pain Loc      Pain Edu?      Excl. in [place]?     Constitutional: Alert and oriented. Well appearing and in no acute distress.  Eyes: Conjunctivae are normal. PERRL. EOMI  Head: Normocephalic and atraumatic. ENT:      Ears: Canals clear. TMs intact bilaterally.      Nose: No congestion/rhinnorhea.      Mouth/Throat: Mucous membranes are moist. Neck:Supple. No thyromegaly. No stridor.  Cardiovascular: Normal rate, regular rhythm. Normal S1 and S2.  Good peripheral circulation. Respiratory: Normal respiratory effort without tachypnea or retractions. Lungs CTAB. No wheezes/rales/rhonchi. Good air entry to the bases with no decreased or absent breath sounds. Hematological/Lymphatic/Immunological: No cervical lymphadenopathy. Cardiovascular: Normal rate, regular rhythm. Normal distal pulses. Gastrointestinal: Bowel sounds 4 quadrants. Soft and nontender to palpation. No guarding or rigidity. No distention. No CVA tenderness. Tenderness along right inguinal area without palpable mass.    Musculoskeletal: Right side back pain without radicular symptoms and right groin pain. Non-tender lumbar spinous processes with intact lumbar ROM.Nontender with normal range of motion in all extremities. Neurologic: Normal speech and language. No gross focal neurologic deficits are appreciated.  Skin:  Skin is warm, dry and intact. No rash noted. Psychiatric: Mood and affect are normal. Speech and behavior are normal. Patient exhibits appropriate insight and judgement.  ____________________________________________   LABS (all labs ordered are listed, but only abnormal results are displayed)  Labs Reviewed - No data to display ____________________________________________  EKG none ____________________________________________  RADIOLOGY none ____________________________________________   PROCEDURES  Procedure(s) performed: no    Critical Care performed: no ____________________________________________   INITIAL IMPRESSION / ASSESSMENT AND PLAN / ED COURSE  Pertinent labs & imaging results that were available during my care of the patient were reviewed by me and considered in my medical decision making (see chart for details).  Patient presents to emergency department with lumbar back pain and right grion following a lifting injury. History and physical exam findings are reassuring symptoms are consistent with lumbar strain and possible inguinal hernia. Patient noted improvement of symptoms following decadron and norflex given during the course of care in the emergency department. Patient will be prescribed prednisone taper and flexeril as needed for muscle spasms. Patient advised to follow up with general surgery for further evaluation of the possible hernia or return to the emergency department if symptoms return or worsen.       If controlled substance prescribed during this visit, 12 month history viewed on the [place] prior to issuing an initial prescription for Schedule  II or III opiod. ____________________________________________   FINAL CLINICAL IMPRESSION(S) / ED DIAGNOSES  Final diagnoses:  Strain of lumbar region, initial encounter  Muscle spasm of back  Right inguinal pain       NEW MEDICATIONS STARTED DURING THIS VISIT:  New Prescriptions   CYCLOBENZAPRINE (FLEXERIL) 10 MG TABLET    Take 1 tablet (10 mg total) by mouth 3 (three) times daily as needed for muscle spasms.   OXYCODONE-ACETAMINOPHEN (ROXICET) 5-325 MG TABLET    Take 1 tablet by mouth every 6 (six) hours as needed.   PREDNISONE (STERAPRED UNI-PAK 21 TAB) 10 MG (21) TBPK TABLET    Take 6 tablets on day 1. Take 5 tablets on day 2. Take 4 tablets on day 3. Take 3 tablets on day 4. Take 2 tablets on day 5. Take 1 tablets on day 6.     Note:  This document was prepared using Dragon voice recognition software and may include unintentional dictation errors.   Jerolyn Shin, PA-C 03/14/17 1628    Earleen Newport, MD 03/15/17 [id number]

## 2017-03-14 NOTE — ED Triage Notes (Signed)
Pt to ed with c/o back pain and right groin pain after lifting heavy machine 2 days ago.

## 2017-03-14 NOTE — Discharge Instructions (Signed)
Take medication as prescribed. Return to emergency department if symptoms worsen and follow-up with PCP as needed.    Follow-up with the general surgeon regarding right groin pain and possible hernia.  Follow-up with your orthopedic provider, Rachelle Hora, for continued back symptoms.

## 2017-04-28 ENCOUNTER — Encounter: Payer: Self-pay | Admitting: Family Medicine

## 2017-05-11 LAB — BASIC METABOLIC PANEL
BUN: 8 (ref 4–21)
CREATININE: 0.9 (ref 0.6–1.3)
Glucose: 87
POTASSIUM: 4.6 (ref 3.4–5.3)
Sodium: 140 (ref 137–147)

## 2017-05-11 LAB — HEPATIC FUNCTION PANEL
ALT: 10 (ref 10–40)
AST: 17 (ref 14–40)
Alkaline Phosphatase: 74 (ref 25–125)
Bilirubin, Total: 0.3

## 2017-05-11 LAB — LIPID PANEL
CHOLESTEROL: 115 (ref 0–200)
HDL: 32 — AB (ref 35–70)
LDL Cholesterol: 67
Triglycerides: 79 (ref 40–160)

## 2017-07-18 ENCOUNTER — Emergency Department
Admission: EM | Admit: 2017-07-18 | Discharge: 2017-07-18 | Disposition: A | Discharge status: Home / Self Care | Payer: Medicaid Other | Attending: Emergency Medicine | Admitting: Emergency Medicine

## 2017-07-18 ENCOUNTER — Encounter: Payer: Self-pay | Admitting: Emergency Medicine

## 2017-07-18 ENCOUNTER — Other Ambulatory Visit: Payer: Self-pay

## 2017-07-18 DIAGNOSIS — S8991XA Unspecified injury of right lower leg, initial encounter: Secondary | ICD-10-CM | POA: Diagnosis present

## 2017-07-18 DIAGNOSIS — Z79899 Other long term (current) drug therapy: Secondary | ICD-10-CM | POA: Diagnosis not present

## 2017-07-18 DIAGNOSIS — Y939 Activity, unspecified: Secondary | ICD-10-CM | POA: Insufficient documentation

## 2017-07-18 DIAGNOSIS — Z8673 Personal history of transient ischemic attack (TIA), and cerebral infarction without residual deficits: Secondary | ICD-10-CM | POA: Diagnosis not present

## 2017-07-18 DIAGNOSIS — S8391XA Sprain of unspecified site of right knee, initial encounter: Secondary | ICD-10-CM | POA: Insufficient documentation

## 2017-07-18 DIAGNOSIS — F17210 Nicotine dependence, cigarettes, uncomplicated: Secondary | ICD-10-CM | POA: Diagnosis not present

## 2017-07-18 DIAGNOSIS — Y998 Other external cause status: Secondary | ICD-10-CM | POA: Insufficient documentation

## 2017-07-18 DIAGNOSIS — J449 Chronic obstructive pulmonary disease, unspecified: Secondary | ICD-10-CM | POA: Diagnosis not present

## 2017-07-18 DIAGNOSIS — Y33XXXA Other specified events, undetermined intent, initial encounter: Secondary | ICD-10-CM | POA: Insufficient documentation

## 2017-07-18 DIAGNOSIS — Y929 Unspecified place or not applicable: Secondary | ICD-10-CM | POA: Insufficient documentation

## 2017-07-18 DIAGNOSIS — I252 Old myocardial infarction: Secondary | ICD-10-CM | POA: Diagnosis not present

## 2017-07-18 DIAGNOSIS — Z7982 Long term (current) use of aspirin: Secondary | ICD-10-CM | POA: Diagnosis not present

## 2017-07-18 DIAGNOSIS — M25461 Effusion, right knee: Secondary | ICD-10-CM | POA: Diagnosis not present

## 2017-07-18 DIAGNOSIS — S86911A Strain of unspecified muscle(s) and tendon(s) at lower leg level, right leg, initial encounter: Secondary | ICD-10-CM

## 2017-07-18 MED ORDER — PREDNISONE 10 MG PO TABS: 10.0000 mg | ORAL_TABLET | Freq: Two times a day (BID) | ORAL | 0 refills | Status: DC

## 2017-07-18 MED ORDER — TRAMADOL HCL 50 MG PO TABS: 50.0000 mg | ORAL_TABLET | Freq: Two times a day (BID) | ORAL | 0 refills | Status: DC

## 2017-07-18 MED ORDER — PREDNISONE 20 MG PO TABS
20.0000 mg | ORAL_TABLET | Freq: Once | ORAL | Status: AC
  Administered 2017-07-18: 20 mg via ORAL
  Filled 2017-07-18: qty 1

## 2017-07-18 NOTE — ED Triage Notes (Signed)
See first nurse note. Says he was moving some furnituyre and twisted right knee.

## 2017-07-18 NOTE — ED Triage Notes (Addendum)
First Nurse Note:  C/O Right knee injury with pain radiating up thigh toward right groin.  Injury occurred while moving furniture.

## 2017-07-18 NOTE — Discharge Instructions (Signed)
Your exam is consistent with a knee strain. There is a little bit of fluid around the joint. Rest with the leg elevate and apply ice to reduce swelling. Follow-up with Dr. Elijio Miles or Rachelle Hora for further management. Take the prescription meds as directed.

## 2017-07-18 NOTE — ED Notes (Addendum)
Pt states he is "going outside to smoke", states " I didn't get anything for pain anyway" to friend he came with in another room. Pt walking out, this RN informed pt of waiting for discharge paper work, pt states " I don't care about that"

## 2017-07-19 NOTE — ED Provider Notes (Signed)
[facility] Emergency Department Provider Note ____________________________________________  Time seen: 1329  I have reviewed the triage vital signs and the nursing notes.  HISTORY  Chief Complaint  Knee Injury  HPI Keith Gibbs is a 56 y.o. male presents to the ED accompanied by his girlfriend who is being seen for separate event.  He presents complaining of pain to his right knee with some referral into the right thigh and groin.  He describes the onset was a few days prior when he was moving some furniture along with his girlfriend.  He apparently lost his footing and twisted on a planted right foot.  Since that time his pain to the general knee with referred pain of the medial groin.  He denies any fall related to the injury.  He had been recently seen for a similar twisting injury to the same knee, and was placed in a knee brace for support.  He apparently followed up with orthopedics in the interim and his symptoms were thought to be related to his chronic hip and back pain.  He presents now with pain and swelling to the right knee and some muscle tension and spasm of the anterior medial right quadriceps.  No catch, click, lock, or give way is reported.  Past Medical History:  Diagnosis Date  . Anxiety   . Arthritis   . Bursitis   . COPD (chronic obstructive pulmonary disease) ([place])   . Depression   . Hepatitis C 2012   No longer has Hep C  . MI (myocardial infarction) ([place])   . Stroke [facility])     Patient Active Problem List   Diagnosis Date Noted  . Overdose of medication, intentional self-harm, subsequent encounter 12/28/2016  . Tobacco use disorder 12/28/2016  . Severe recurrent major depression without psychotic features ([place]) 12/27/2016  . Coronary artery disease 12/27/2016  . Alcohol use disorder, moderate, dependence ([place]) 12/27/2016  . Cocaine use disorder, moderate, dependence ([place]) 12/27/2016  . Cannabis use disorder, moderate, dependence  ([place]) 12/27/2016  . Angina at rest [facility]) 06/11/2016  . Chest pain 06/10/2016  . Bipolar 1 disorder ([place]) 05/27/2013  . Myalgia and myositis 05/27/2013  . Abnormal ejaculation 11/30/2012  . Chest pain, unspecified 11/30/2012  . Degenerative arthritis of hip 11/16/2012  . Hip pain, left 08/29/2012  . Swollen testicle 07/27/2012  . Schizophrenia ([place]) 06/27/2012  . Osteoarthritis 06/27/2012  . Shoulder pain 06/27/2012  . COPD (chronic obstructive pulmonary disease) ([place]) 06/27/2012  . Chronic hepatitis C ([place]) 06/27/2012  . GERD (gastroesophageal reflux disease) 06/27/2012  . Complete rupture of rotator cuff 02/10/2012  . Cocaine abuse in remission ([place]) 12/05/2011    Past Surgical History:  Procedure Laterality Date  . ABDOMINAL SURGERY     Intestines  . APPENDECTOMY    . BACK SURGERY    . CARDIAC CATHETERIZATION Left 06/10/2016   Procedure: Left Heart Cath and Coronary Angiography;  Surgeon: Dionisio David, MD;  Location: [facility];  Service: Cardiovascular;  Laterality: Left;  . CARDIAC CATHETERIZATION N/A 06/10/2016   Procedure: Coronary Stent Intervention;  Surgeon: Yolonda Kida, MD;  Location: [facility];  Service: Cardiovascular;  Laterality: N/A;  . COLONOSCOPY    . COLONOSCOPY WITH PROPOFOL N/A 01/05/2017   Procedure: COLONOSCOPY WITH PROPOFOL;  Surgeon: Jonathon Bellows, MD;  Location: [facility] ENDOSCOPY;  Service: Endoscopy;  Laterality: N/A;  . CORONARY ANGIOPLASTY WITH STENT PLACEMENT    . ESOPHAGOGASTRODUODENOSCOPY (EGD) WITH PROPOFOL N/A 01/05/2017  Procedure: ESOPHAGOGASTRODUODENOSCOPY (EGD) WITH PROPOFOL;  Surgeon: Jonathon Bellows, MD;  Location: [facility] ENDOSCOPY;  Service: Endoscopy;  Laterality: N/A;  . SHOULDER SURGERY  04/09/2012    Prior to Admission medications   Medication Sig Start Date End Date Taking? Authorizing Provider  acetaminophen (TYLENOL) 500 MG tablet Take 2 tablets (1,000 mg total) by mouth every 6 (six) hours as needed. For pain.  12/30/16   Pucilowska, Wardell Honour, MD  albuterol (PROAIR HFA) 108 (90 Base) MCG/ACT inhaler Inhale 2 puffs into the lungs every 6 (six) hours as needed for wheezing or shortness of breath. 12/30/16   Pucilowska, Herma Ard B, MD  aspirin EC 81 MG tablet Take 1 tablet (81 mg total) by mouth daily. 12/30/16   Pucilowska, Herma Ard B, MD  atorvastatin (LIPITOR) 40 MG tablet Take 1 tablet (40 mg total) by mouth daily at 6 PM. 12/30/16   Pucilowska, Jolanta B, MD  cyclobenzaprine (FLEXERIL) 10 MG tablet Take 1 tablet (10 mg total) by mouth 3 (three) times daily as needed for muscle spasms. 03/14/17   Little, Traci M, PA-C  diphenhydrAMINE (BENADRYL) 25 mg capsule Take 25 mg by mouth daily.    [provider]  DULERA 200-5 MCG/ACT AERO TAKE 2 PUFFS BY MOUTH TWICE A DAY 12/30/16   Pucilowska, Jolanta B, MD  fluvoxaMINE (LUVOX) 100 MG tablet Take 1 tablet (100 mg total) by mouth at bedtime. 12/30/16   Pucilowska, Herma Ard B, MD  HYDROcodone-acetaminophen (NORCO) 5-325 MG tablet Take 1 tablet by mouth every 6 (six) hours as needed for severe pain. 02/20/17   Darel Hong, MD  hydrOXYzine (ATARAX/VISTARIL) 50 MG tablet Take 50 mg by mouth every 6 (six) hours as needed.    [provider]  isosorbide mononitrate (IMDUR) 30 MG 24 hr tablet Take 1 tablet (30 mg total) by mouth daily. 12/30/16   Pucilowska, Jolanta B, MD  LORazepam (ATIVAN) 0.5 MG tablet Take 1 tablet (0.5 mg total) by mouth every 8 (eight) hours as needed for anxiety. 02/17/17 02/17/18  Harvest Dark, MD  Melatonin 3 MG TABS Take 3 mg by mouth at bedtime.    [provider]  Multiple Vitamin (MULTIVITAMIN) tablet Take 1 tablet by mouth daily.    [provider]  nitroGLYCERIN (NITROSTAT) 0.4 MG SL tablet Place 1 tablet (0.4 mg total) under the tongue every 5 (five) minutes x 3 doses as needed for chest pain. 12/30/16   Pucilowska, Herma Ard B, MD  omeprazole (PRILOSEC) 20 MG capsule Take 1 capsule (20 mg total) by mouth  daily. Patient taking differently: Take 40 mg by mouth daily.  12/30/16   Pucilowska, Jolanta B, MD  oxyCODONE-acetaminophen (ROXICET) 5-325 MG tablet Take 1 tablet by mouth every 6 (six) hours as needed. 03/14/17 03/14/18  Little, Traci M, PA-C  prazosin (MINIPRESS) 1 MG capsule Take 1 capsule (1 mg total) by mouth at bedtime. 12/30/16   Pucilowska, Wardell Honour, MD  predniSONE (DELTASONE) 10 MG tablet Take 1 tablet (10 mg total) by mouth 2 (two) times daily with a meal. 07/18/17   Amayiah Gosnell, Dannielle Karvonen, PA-C  QUEtiapine (SEROQUEL) 200 MG tablet Take 1 tablet (200 mg total) by mouth at bedtime. Patient not taking: Reported on 02/17/2017 12/30/16   Pucilowska, Herma Ard B, MD  rOPINIRole (REQUIP) 0.5 MG tablet TAKE 1 TO 2 TABLETS BY MOUTH QHS AS NEEDED FOR RESTLESS LEGS. 11/16/16   [provider]  tamsulosin (FLOMAX) 0.4 MG CAPS capsule Take 1 capsule (0.4 mg total) by mouth daily. 12/30/16  Pucilowska, Jolanta B, MD  ticagrelor (BRILINTA) 90 MG TABS tablet Take 1 tablet (90 mg total) by mouth 2 (two) times daily. 12/30/16   Pucilowska, Herma Ard B, MD  traMADol (ULTRAM) 50 MG tablet Take 1 tablet (50 mg total) by mouth 2 (two) times daily. 07/18/17   Tiena Manansala, Dannielle Karvonen, PA-C  varenicline (CHANTIX) 1 MG tablet Take 1 mg by mouth 2 (two) times daily.    [provider]    Allergies Anette Guarneri [lurasidone hcl]; Naproxen; and Saphris [asenapine]  Family History  Problem Relation Age of Onset  . Osteoarthritis Mother   . Heart disease Mother   . Hypertension Mother   . Heart failure Father   . Heart disease Father   . Early death Father   . Hypertension Father   . Hypertension Sister   . Prostate cancer Neg Hx   . Bladder Cancer Neg Hx   . Kidney cancer Neg Hx     Social History Social History   Tobacco Use  . Smoking status: Current Every Day Smoker    Packs/day: 0.50    Types: Cigarettes    Last attempt to quit: 03/04/2012    Years since quitting: 5.3  . Smokeless tobacco:  Never Used  Substance Use Topics  . Alcohol use: Yes  . Drug use: No    Review of Systems  Constitutional: Negative for fever. Cardiovascular: Negative for chest pain. Respiratory: Negative for shortness of breath. Musculoskeletal: Negative for back pain.  Knee pain as above. Skin: Negative for rash. Neurological: Negative for headaches, focal weakness or numbness. ____________________________________________  PHYSICAL EXAM:  VITAL SIGNS: ED Triage Vitals  Enc Vitals Group     BP 07/18/17 1228 (!) 144/77     Pulse Rate 07/18/17 1228 63     Resp 07/18/17 1228 16     Temp 07/18/17 1228 (!) 97.5 F (36.4 C)     Temp Source 07/18/17 1228 Oral     SpO2 07/18/17 1228 98 %     Weight 07/18/17 1229 220 lb (99.8 kg)     Height 07/18/17 1229 6\' 3"  (1.905 m)     Head Circumference --      Peak Flow --      Pain Score 07/18/17 1228 10     Pain Loc --      Pain Edu? --      Excl. in [place]? --     Constitutional: Alert and oriented. Well appearing and in no distress. Head: Normocephalic and atraumatic. Cardiovascular: Normal rate, regular rhythm. Normal distal pulses. Respiratory: Normal respiratory effort. No wheezes/rales/rhonchi. Musculoskeletal: Knee with a mild superior effusion noted.  No obvious deformity, dislocation, or erythema noted.  Normal knee flexion and extension range on exam.  No significant valgus or varus joint stress.  Patella tracks normally without laxity or ballottement.  No popliteal space fullness is appreciated.  Patient is tender to palpation along the glorious muscle of the medial thigh.  No palpable hernia is appreciated.  Nontender with normal range of motion in all other extremities.  Neurologic:  Mildly antalgic gait without ataxia. Normal speech and language. No gross focal neurologic deficits are appreciated. Skin:  Skin is warm, dry and intact. No rash noted. ____________________________________________   RADIOLOGY  Not  indicated. ____________________________________________  PROCEDURES  Procedures Ace bandage  Prednisone 20 mg PO ____________________________________________  INITIAL IMPRESSION / ASSESSMENT AND PLAN / ED COURSE  Patient with ED evaluation of right knee pain following a mechanical injury.  He sustained  a twisting injury on a planted right foot under load.  His exam shows a mild knee effusion but normal patella range without ballottement and no indication of any internal derangement.  He is placed in a Ace bandage for support and referred back to [facility] for further evaluation and management.  Prescription for prednisone as well as #10 Ultram was provided for his benefit.  He will follow-up as directed.  The patient apparently was disappointed in the fact that he was not given any narcotic pain medicine here in the ED.  He left after his examination did not sign his discharge papers note he received his prescriptions.  I reviewed the patient's prescription history over the last 12 months in the multi-state controlled substances database(s) that includes [place], [place], [place], [place], [place], [place], [place], [place], [place], [place], [place], [place], [place], and [place].  Results were notable for a prescription for Oxycontin 10 mg (#30) from 04/2017. ____________________________________________  FINAL CLINICAL IMPRESSION(S) / ED DIAGNOSES  Final diagnoses:  Strain of right knee, initial encounter  Effusion of right knee      Melvenia Needles, PA-C 07/19/17 1534    Orbie Pyo, MD 07/28/17 [phone]

## 2017-07-26 ENCOUNTER — Other Ambulatory Visit: Payer: Self-pay | Admitting: Orthopedic Surgery

## 2017-07-26 DIAGNOSIS — M25561 Pain in right knee: Principal | ICD-10-CM

## 2017-07-26 DIAGNOSIS — G8929 Other chronic pain: Secondary | ICD-10-CM

## 2017-08-02 ENCOUNTER — Ambulatory Visit
Admission: RE | Admit: 2017-08-02 | Discharge: 2017-08-02 | Disposition: A | Discharge status: Home / Self Care | Payer: Medicaid Other | Source: Ambulatory Visit | Attending: Orthopedic Surgery | Admitting: Orthopedic Surgery

## 2017-08-02 DIAGNOSIS — X58XXXA Exposure to other specified factors, initial encounter: Secondary | ICD-10-CM | POA: Diagnosis not present

## 2017-08-02 DIAGNOSIS — M25561 Pain in right knee: Secondary | ICD-10-CM

## 2017-08-02 DIAGNOSIS — S83241A Other tear of medial meniscus, current injury, right knee, initial encounter: Secondary | ICD-10-CM | POA: Diagnosis not present

## 2017-08-02 DIAGNOSIS — M25461 Effusion, right knee: Secondary | ICD-10-CM | POA: Insufficient documentation

## 2017-08-02 DIAGNOSIS — G8929 Other chronic pain: Secondary | ICD-10-CM

## 2017-08-02 DIAGNOSIS — S83281A Other tear of lateral meniscus, current injury, right knee, initial encounter: Secondary | ICD-10-CM | POA: Diagnosis not present

## 2017-08-02 DIAGNOSIS — M659 Synovitis and tenosynovitis, unspecified: Secondary | ICD-10-CM | POA: Diagnosis not present

## 2017-08-02 DIAGNOSIS — M1711 Unilateral primary osteoarthritis, right knee: Secondary | ICD-10-CM | POA: Insufficient documentation

## 2017-08-14 ENCOUNTER — Emergency Department
Admission: EM | Admit: 2017-08-14 | Discharge: 2017-08-14 | Disposition: A | Discharge status: Home / Self Care | Payer: Medicaid Other | Attending: Emergency Medicine | Admitting: Emergency Medicine

## 2017-08-14 ENCOUNTER — Emergency Department: Payer: Medicaid Other

## 2017-08-14 ENCOUNTER — Encounter: Payer: Self-pay | Admitting: Emergency Medicine

## 2017-08-14 ENCOUNTER — Other Ambulatory Visit: Payer: Self-pay

## 2017-08-14 DIAGNOSIS — Z7982 Long term (current) use of aspirin: Secondary | ICD-10-CM | POA: Diagnosis not present

## 2017-08-14 DIAGNOSIS — I252 Old myocardial infarction: Secondary | ICD-10-CM | POA: Diagnosis not present

## 2017-08-14 DIAGNOSIS — M5440 Lumbago with sciatica, unspecified side: Secondary | ICD-10-CM | POA: Diagnosis not present

## 2017-08-14 DIAGNOSIS — Z79899 Other long term (current) drug therapy: Secondary | ICD-10-CM | POA: Insufficient documentation

## 2017-08-14 DIAGNOSIS — F17210 Nicotine dependence, cigarettes, uncomplicated: Secondary | ICD-10-CM | POA: Insufficient documentation

## 2017-08-14 DIAGNOSIS — M5441 Lumbago with sciatica, right side: Secondary | ICD-10-CM

## 2017-08-14 DIAGNOSIS — Z981 Arthrodesis status: Secondary | ICD-10-CM | POA: Insufficient documentation

## 2017-08-14 DIAGNOSIS — J449 Chronic obstructive pulmonary disease, unspecified: Secondary | ICD-10-CM | POA: Insufficient documentation

## 2017-08-14 DIAGNOSIS — M545 Low back pain: Secondary | ICD-10-CM | POA: Diagnosis present

## 2017-08-14 MED ORDER — ONDANSETRON 4 MG PO TBDP
ORAL_TABLET | ORAL | Status: AC
  Administered 2017-08-14: 4 mg via ORAL
  Filled 2017-08-14: qty 1

## 2017-08-14 MED ORDER — HYDROMORPHONE HCL 1 MG/ML IJ SOLN
0.5000 mg | Freq: Once | INTRAMUSCULAR | Status: AC
  Administered 2017-08-14: 0.5 mg via INTRAMUSCULAR

## 2017-08-14 MED ORDER — CYCLOBENZAPRINE HCL 10 MG PO TABS
10.0000 mg | ORAL_TABLET | Freq: Once | ORAL | Status: AC
  Administered 2017-08-14: 10 mg via ORAL

## 2017-08-14 MED ORDER — CYCLOBENZAPRINE HCL 10 MG PO TABS
10.0000 mg | ORAL_TABLET | Freq: Three times a day (TID) | ORAL | 0 refills | Status: DC | PRN
Start: 2017-08-14 — End: 2017-09-18

## 2017-08-14 MED ORDER — CYCLOBENZAPRINE HCL 10 MG PO TABS
ORAL_TABLET | ORAL | Status: AC
  Administered 2017-08-14: 10 mg via ORAL
  Filled 2017-08-14: qty 1

## 2017-08-14 MED ORDER — PREDNISONE 10 MG PO TABS: ORAL_TABLET | ORAL | 0 refills | Status: DC

## 2017-08-14 MED ORDER — HYDROMORPHONE HCL 1 MG/ML IJ SOLN
INTRAMUSCULAR | Status: AC
  Administered 2017-08-14: 0.5 mg via INTRAMUSCULAR
  Filled 2017-08-14: qty 1

## 2017-08-14 MED ORDER — ONDANSETRON 4 MG PO TBDP
4.0000 mg | ORAL_TABLET | Freq: Once | ORAL | Status: AC
  Administered 2017-08-14: 4 mg via ORAL

## 2017-08-14 MED ORDER — HYDROCODONE-ACETAMINOPHEN 5-325 MG PO TABS: 1.0000 | ORAL_TABLET | Freq: Four times a day (QID) | ORAL | 0 refills | Status: DC | PRN

## 2017-08-14 MED ORDER — HYDROMORPHONE HCL 1 MG/ML IJ SOLN: 1.0000 mg | Freq: Once | INTRAMUSCULAR | Status: DC

## 2017-08-14 NOTE — ED Notes (Signed)
Pt with lower back pain,10/10, that pt says was from lifting construction supplies. Pain located at prior surgery site. Denies numbness, weakness BLE. No changes in bowel or bladder habits.   Right groin pain, swelling.

## 2017-08-14 NOTE — ED Triage Notes (Addendum)
Has been working on building a building of some type and thinks hurt lower back doing it. ambulatory to check in, placed in wheelchair. Pain also in right groin and some swelling here per pt.

## 2017-08-14 NOTE — Discharge Instructions (Addendum)
You are evaluated for low back pain, and as we discussed although no certain cause was found, your exam and evaluation are overall reassuring in the emergency room today.  As we discussed, I am suspicious of muscle spasm and possibly inflammation causing irritation of the right side sciatic nerve.  Return to the emergency room immediately for any worsening condition including uncontrolled pain, fever, swelling, skin rash, abdominal pain, weakness, numbness, peeing or pooping on yourself, or any other symptoms concerning to you.

## 2017-08-14 NOTE — ED Notes (Signed)
Topaz not working 

## 2017-08-14 NOTE — ED Provider Notes (Signed)
[facility] Emergency Department Provider Note ____________________________________________   I have reviewed the triage vital signs and the triage nursing note.  HISTORY  Chief Complaint Back Pain   Historian Patient  HPI Keith Gibbs is a 57 y.o. male with a history of chronic back pain, arthritis, bursitis, previously followed with a spine surgeon and had lumbar spine surgery about 3 years ago, has followed with chronic pain management and was on OxyContin until about 3 or 4 weeks ago when he states he missed an appointment secondary to being out of town and was "cut off.  "He states he was doing okay from pain management standpoint but he has been building a building by himself on his property, and he thinks that he pulled something in the last 2 days because now the pain is spasm in his low back more so right sided but across the left side as well, worse with standing and laying in movement.  He has chronic pain in his right knee and is being followed by orthopedics and is told he may need surgery for that.  He has some pain that goes around to his groin, he suspects that is actually related to the low back pain because he has some pain into his right buttock as well.  Pain is severe.  Cannot take naproxen secondary to swelling and hives.     Past Medical History:  Diagnosis Date  . Anxiety   . Arthritis   . Bursitis   . COPD (chronic obstructive pulmonary disease) ([place])   . Depression   . Hepatitis C 2012   No longer has Hep C  . MI (myocardial infarction) ([place])   . Stroke [facility])     Patient Active Problem List   Diagnosis Date Noted  . Overdose of medication, intentional self-harm, subsequent encounter 12/28/2016  . Tobacco use disorder 12/28/2016  . Severe recurrent major depression without psychotic features ([place]) 12/27/2016  . Coronary artery disease 12/27/2016  . Alcohol use disorder, moderate, dependence ([place]) 12/27/2016  . Cocaine use  disorder, moderate, dependence ([place]) 12/27/2016  . Cannabis use disorder, moderate, dependence ([place]) 12/27/2016  . Angina at rest [facility]) 06/11/2016  . Chest pain 06/10/2016  . Bipolar 1 disorder ([place]) 05/27/2013  . Myalgia and myositis 05/27/2013  . Abnormal ejaculation 11/30/2012  . Chest pain, unspecified 11/30/2012  . Degenerative arthritis of hip 11/16/2012  . Hip pain, left 08/29/2012  . Swollen testicle 07/27/2012  . Schizophrenia ([place]) 06/27/2012  . Osteoarthritis 06/27/2012  . Shoulder pain 06/27/2012  . COPD (chronic obstructive pulmonary disease) ([place]) 06/27/2012  . Chronic hepatitis C ([place]) 06/27/2012  . GERD (gastroesophageal reflux disease) 06/27/2012  . Complete rupture of rotator cuff 02/10/2012  . Cocaine abuse in remission ([place]) 12/05/2011    Past Surgical History:  Procedure Laterality Date  . ABDOMINAL SURGERY     Intestines  . APPENDECTOMY    . BACK SURGERY    . CARDIAC CATHETERIZATION Left 06/10/2016   Procedure: Left Heart Cath and Coronary Angiography;  Surgeon: Dionisio David, MD;  Location: [place] [facility];  Service: Cardiovascular;  Laterality: Left;  . CARDIAC CATHETERIZATION N/A 06/10/2016   Procedure: Coronary Stent Intervention;  Surgeon: Yolonda Kida, MD;  Location: [place] [facility];  Service: Cardiovascular;  Laterality: N/A;  . COLONOSCOPY    . COLONOSCOPY WITH PROPOFOL N/A 01/05/2017   Procedure: COLONOSCOPY WITH PROPOFOL;  Surgeon: Jonathon Bellows, MD;  Location: [facility] ENDOSCOPY;  Service: Endoscopy;  Laterality: N/A;  .  CORONARY ANGIOPLASTY WITH STENT PLACEMENT    . ESOPHAGOGASTRODUODENOSCOPY (EGD) WITH PROPOFOL N/A 01/05/2017   Procedure: ESOPHAGOGASTRODUODENOSCOPY (EGD) WITH PROPOFOL;  Surgeon: Jonathon Bellows, MD;  Location[facility] ENDOSCOPY;  Service: Endoscopy;  Laterality: N/A;  . SHOULDER SURGERY  04/09/2012    Prior to Admission medications   Medication Sig Start Date End Date Taking? Authorizing Provider  acetaminophen (TYLENOL)  500 MG tablet Take 2 tablets (1,000 mg total) by mouth every 6 (six) hours as needed. For pain. 12/30/16   Pucilowska, Wardell Honour, MD  albuterol (PROAIR HFA) 108 (90 Base) MCG/ACT inhaler Inhale 2 puffs into the lungs every 6 (six) hours as needed for wheezing or shortness of breath. 12/30/16   Pucilowska, Herma Ard B, MD  aspirin EC 81 MG tablet Take 1 tablet (81 mg total) by mouth daily. 12/30/16   Pucilowska, Herma Ard B, MD  atorvastatin (LIPITOR) 40 MG tablet Take 1 tablet (40 mg total) by mouth daily at 6 PM. 12/30/16   Pucilowska, Jolanta B, MD  cyclobenzaprine (FLEXERIL) 10 MG tablet Take 1 tablet (10 mg total) by mouth 3 (three) times daily as needed for muscle spasms. 08/14/17   Lisa Roca, MD  diphenhydrAMINE (BENADRYL) 25 mg capsule Take 25 mg by mouth daily.    [provider]  DULERA 200-5 MCG/ACT AERO TAKE 2 PUFFS BY MOUTH TWICE A DAY 12/30/16   Pucilowska, Jolanta B, MD  fluvoxaMINE (LUVOX) 100 MG tablet Take 1 tablet (100 mg total) by mouth at bedtime. 12/30/16   Pucilowska, Herma Ard B, MD  HYDROcodone-acetaminophen (NORCO/VICODIN) 5-325 MG tablet Take 1-2 tablets by mouth every 6 (six) hours as needed for moderate pain. 08/14/17   Lisa Roca, MD  hydrOXYzine (ATARAX/VISTARIL) 50 MG tablet Take 50 mg by mouth every 6 (six) hours as needed.    [provider]  isosorbide mononitrate (IMDUR) 30 MG 24 hr tablet Take 1 tablet (30 mg total) by mouth daily. 12/30/16   Pucilowska, Jolanta B, MD  LORazepam (ATIVAN) 0.5 MG tablet Take 1 tablet (0.5 mg total) by mouth every 8 (eight) hours as needed for anxiety. 02/17/17 02/17/18  Harvest Dark, MD  Melatonin 3 MG TABS Take 3 mg by mouth at bedtime.    [provider]  Multiple Vitamin (MULTIVITAMIN) tablet Take 1 tablet by mouth daily.    [provider]  nitroGLYCERIN (NITROSTAT) 0.4 MG SL tablet Place 1 tablet (0.4 mg total) under the tongue every 5 (five) minutes x 3 doses as needed for chest pain. 12/30/16   Pucilowska,  Herma Ard B, MD  omeprazole (PRILOSEC) 20 MG capsule Take 1 capsule (20 mg total) by mouth daily. Patient taking differently: Take 40 mg by mouth daily.  12/30/16   Pucilowska, Jolanta B, MD  oxyCODONE-acetaminophen (ROXICET) 5-325 MG tablet Take 1 tablet by mouth every 6 (six) hours as needed. 03/14/17 03/14/18  Little, Traci M, PA-C  prazosin (MINIPRESS) 1 MG capsule Take 1 capsule (1 mg total) by mouth at bedtime. 12/30/16   Pucilowska, Wardell Honour, MD  predniSONE (DELTASONE) 10 MG tablet Take 50 mg by mouth for 2 days Take 40 mg by mouth for 2 days Take 30 mg by mouth for 2 days  Take 20 mg by mouth for 2 days  take 10 mg by mouth for 2 days 08/14/17   Lisa Roca, MD  QUEtiapine (SEROQUEL) 200 MG tablet Take 1 tablet (200 mg total) by mouth at bedtime. Patient not taking: Reported on 02/17/2017 12/30/16   Clovis Fredrickson, MD  rOPINIRole (REQUIP)  0.5 MG tablet TAKE 1 TO 2 TABLETS BY MOUTH QHS AS NEEDED FOR RESTLESS LEGS. 11/16/16   [provider]  tamsulosin (FLOMAX) 0.4 MG CAPS capsule Take 1 capsule (0.4 mg total) by mouth daily. 12/30/16   Pucilowska, Herma Ard B, MD  ticagrelor (BRILINTA) 90 MG TABS tablet Take 1 tablet (90 mg total) by mouth 2 (two) times daily. 12/30/16   Pucilowska, Herma Ard B, MD  traMADol (ULTRAM) 50 MG tablet Take 1 tablet (50 mg total) by mouth 2 (two) times daily. 07/18/17   Menshew, Dannielle Karvonen, PA-C  varenicline (CHANTIX) 1 MG tablet Take 1 mg by mouth 2 (two) times daily.    [provider]    Allergies  Allergen Reactions  . Latuda [Lurasidone Hcl] Other (See Comments)    tremor tremor  . Naproxen Hives and Swelling  . Saphris [Asenapine] Other (See Comments)    Increased tremors Increased tremors    Family History  Problem Relation Age of Onset  . Osteoarthritis Mother   . Heart disease Mother   . Hypertension Mother   . Heart failure Father   . Heart disease Father   . Early death Father   . Hypertension Father   . Hypertension  Sister   . Prostate cancer Neg Hx   . Bladder Cancer Neg Hx   . Kidney cancer Neg Hx     Social History Social History   Tobacco Use  . Smoking status: Current Every Day Smoker    Packs/day: 0.50    Types: Cigarettes    Last attempt to quit: 03/04/2012    Years since quitting: 5.4  . Smokeless tobacco: Never Used  Substance Use Topics  . Alcohol use: Yes  . Drug use: No    Review of Systems  Constitutional: Negative for fever. Eyes: Negative for visual changes. ENT: Negative for sore throat. Cardiovascular: Negative for chest pain. Respiratory: Negative for shortness of breath. Gastrointestinal: Negative for abdominal pain, vomiting and diarrhea. Genitourinary: Negative for dysuria. Musculoskeletal: Positive as per HPI for back pain. Skin: Negative for rash. Neurological: Negative for headache.  ____________________________________________   PHYSICAL EXAM:  VITAL SIGNS: ED Triage Vitals  Enc Vitals Group     BP 08/14/17 1053 126/82     Pulse Rate 08/14/17 1053 74     Resp 08/14/17 1053 18     Temp 08/14/17 1053 98.5 F (36.9 C)     Temp Source 08/14/17 1053 Oral     SpO2 08/14/17 1053 100 %     Weight 08/14/17 1047 220 lb (99.8 kg)     Height 08/14/17 1047 6\' 3"  (1.905 m)     Head Circumference --      Peak Flow --      Pain Score 08/14/17 1047 10     Pain Loc --      Pain Edu? --      Excl. in [place]? --      Constitutional: Alert and oriented. Well appearing overall but symptoms wincing in pain when he tries to move located to the back. HEENT   Head: Normocephalic and atraumatic.      Eyes: Conjunctivae are normal. Pupils equal and round.       Ears:         Nose: No congestion/rhinnorhea.   Mouth/Throat: Mucous membranes are moist.   Neck: No stridor. Cardiovascular/Chest: Normal rate, regular rhythm.  No murmurs, rubs, or gallops. Respiratory: Normal respiratory effort without tachypnea nor retractions. Breath sounds are clear and  equal  bilaterally. No wheezes/rales/rhonchi. Gastrointestinal: Soft. No distention, no guarding, no rebound. Nontender.   Genitourinary/rectal: Reports some discomfort in the right groin, but there is no hernia mass, no scrotal tenderness or abnormality. Musculoskeletal: Midline low back scar.  Patient has significantly tender and palpable muscle spasm especially right paraspinous muscles but also across the left side of the low back as well.  Normal range of motion in all extremities. No joint effusions.  Ports some tenderness to movement of the right knee, but there is no joint effusion there.  No edema. Neurologic:  Normal speech and language. No gross or focal neurologic deficits are appreciated. Skin:  Skin is warm, dry and intact. No rash noted. Psychiatric: Mood and affect are normal. Speech and behavior are normal. Patient exhibits appropriate insight and judgment.   ____________________________________________  LABS (pertinent positives/negatives) I, Lisa Roca, MD the attending physician have reviewed the labs noted below.  Labs Reviewed - No data to display   ____________________________________________  RADIOLOGY All Xrays were viewed by me.  Imaging interpreted by Radiologist, and I, Lisa Roca, MD the attending physician have reviewed the radiologist interpretation noted below.  Lumbar spine 2 views:  IMPRESSION: Stable postoperative changes of posterior fusion from L4-S1.  No acute bony abnormality. __________________________________________  PROCEDURES  Procedure(s) performed: None  Critical Care performed: None   ____________________________________________  ED COURSE / ASSESSMENT AND PLAN  Pertinent labs & imaging results that were available during my care of the patient were reviewed by me and considered in my medical decision making (see chart for details).    I am suspicious of musculoskeletal low back pain/strain given the physical labor he is been  doing and known chronic low back pain.  I think he has some symptoms of sciatica on the right side, no symptoms concerning for cauda equina or neurosurgical emergency.  Initially is complaining of some groin pain, there is no sign of cellulitis or vascular insufficiency or hernia.  X-ray reassuring without bony abnormality or hardware malfunction.  Patient was given symptomatic medication here in the emergency department I am going to discharge him with pain medication short course as well as Flexeril.  He does have a primary care doctor and an orthopedic doctor to follow-up with.  Upon discussion of patient discharge, with his girlfriend out of the room, patient states that he also wanted to mention that he felt like his right testicle was "pulling.  "He did not call it painful.  He says it has been there for a little while and it comes on and off.  He is denying urinary symptoms.  No hematuria.  I did do a testicular examination, there is no swollen testicle, no tender testicle, no inguinal hernia.  I discussed with him that from an emergency standpoint, an ultrasound could be recommended to evaluate for testicular torsion although ask extremely low suspicion for that based on his clinical description and physical exam.  I offered to check a urine sample as well for urinary tract infection, blood for kidney stone, check for prostatitis, patient stated that he did not want to stay to do these tests and that he would follow-up with his primary care doctor with respect to the intermittent pulling sensation in his right testicle.  We discussed return precautions with respect to that.  DIFFERENTIAL DIAGNOSIS: Including but not limited to musculoskeletal pain, muscle spasm, hardware complication or abnormality, compression fracture, radiculopathy, cellulitis, bursitis, sciatica, cauda equina, etc.  CONSULTATIONS:   None   Patient /  Family / Caregiver informed of clinical course, medical decision-making  process, and agree with plan.   I discussed return precautions, follow-up instructions, and discharge instructions with patient and/or family.  Discharge Instructions : You are evaluated for low back pain, and as we discussed although no certain cause was found, your exam and evaluation are overall reassuring in the emergency room today.  As we discussed, I am suspicious of muscle spasm and possibly inflammation causing irritation of the right side sciatic nerve.  Return to the emergency room immediately for any worsening condition including uncontrolled pain, fever, swelling, skin rash, abdominal pain, weakness, numbness, peeing or pooping on yourself, or any other symptoms concerning to you.    ___________________________________________   FINAL CLINICAL IMPRESSION(S) / ED DIAGNOSES   Final diagnoses:  Right-sided low back pain with right-sided sciatica, unspecified chronicity      ___________________________________________        Note: This dictation was prepared with Dragon dictation. Any transcriptional errors that result from this process are unintentional    Lisa Roca, MD 08/14/17 1309

## 2017-08-29 ENCOUNTER — Inpatient Hospital Stay: Admission: RE | Admit: 2017-08-29 | Payer: Self-pay | Source: Ambulatory Visit

## 2017-08-30 ENCOUNTER — Other Ambulatory Visit: Payer: Self-pay

## 2017-08-30 ENCOUNTER — Encounter
Admission: RE | Admit: 2017-08-30 | Discharge: 2017-08-30 | Disposition: A | Discharge status: Home / Self Care | Payer: Medicaid Other | Source: Ambulatory Visit | Attending: Orthopedic Surgery | Admitting: Orthopedic Surgery

## 2017-08-30 DIAGNOSIS — Z01810 Encounter for preprocedural cardiovascular examination: Secondary | ICD-10-CM | POA: Insufficient documentation

## 2017-08-30 DIAGNOSIS — Z01812 Encounter for preprocedural laboratory examination: Secondary | ICD-10-CM | POA: Insufficient documentation

## 2017-08-30 HISTORY — DX: Gastro-esophageal reflux disease without esophagitis: K21.9

## 2017-08-30 HISTORY — DX: Essential (primary) hypertension: I10

## 2017-08-30 LAB — CBC
HCT: 44.9 % (ref 40.0–52.0)
HEMOGLOBIN: 15.4 g/dL (ref 13.0–18.0)
MCH: 32.9 pg (ref 26.0–34.0)
MCHC: 34.2 g/dL (ref 32.0–36.0)
MCV: 96.1 fL (ref 80.0–100.0)
Platelets: 249 10*3/uL (ref 150–440)
RBC: 4.67 MIL/uL (ref 4.40–5.90)
RDW: 14.3 % (ref 11.5–14.5)
WBC: 9.4 10*3/uL (ref 3.8–10.6)

## 2017-08-30 LAB — BASIC METABOLIC PANEL
ANION GAP: 11 (ref 5–15)
BUN: 10 mg/dL (ref 6–20)
CHLORIDE: 103 mmol/L (ref 101–111)
CO2: 20 mmol/L — ABNORMAL LOW (ref 22–32)
Calcium: 9 mg/dL (ref 8.9–10.3)
Creatinine, Ser: 0.81 mg/dL (ref 0.61–1.24)
GFR calc Af Amer: >60 mL/min (ref >60)
GLUCOSE: 109 mg/dL — AB (ref 65–99)
POTASSIUM: 4.2 mmol/L (ref 3.5–5.1)
SODIUM: 134 mmol/L — AB (ref 135–145)

## 2017-08-30 LAB — SURGICAL PCR SCREEN
MRSA, PCR: NEGATIVE
Staphylococcus aureus: NEGATIVE

## 2017-08-30 NOTE — Patient Instructions (Signed)
Your procedure is scheduled on: September 05, 2017 TUESDAY Report to Same Day Surgery on the 2nd floor in the [place]. To find out your arrival time, please call [phone] between 1PM - 3PM on: Monday September 04, 2017  REMEMBER: Instructions that are not followed completely may result in serious medical risk, up to and including death; or upon the discretion of your surgeon and anesthesiologist your surgery may need to be rescheduled.  Do not eat food after midnight the night before your procedure.  No gum chewing or hard candies.  You may however, drink CLEAR liquids up to 2 hours before you are scheduled to arrive at the hospital for your procedure.  Do not drink clear liquids within 2 hours of the start of your surgery.  Clear liquids include: - water  - apple juice without pulp - clear gatorade - black coffee or tea (Do NOT add anything to the coffee or tea) Do NOT drink anything that is not on this list.  Type 1 and Type 2 diabetics should only drink water.  No Alcohol for 24 hours before or after surgery.  No Smoking including e-cigarettes for 24 hours prior to surgery. No chewable tobacco products for at least 6 hours prior to surgery. No nicotine patches on the day of surgery.  On the morning of surgery brush your teeth with toothpaste and water, you may rinse your mouth with mouthwash if you wish. Do not swallow any  toothpaste OR mouthwash.  Notify your doctor if there is any change in your medical condition (cold, fever, infection).  Do not wear jewelry, make-up, hairpins, clips or nail polish.  Do not wear lotions, powders, or perfumes. You may wear deodorant.  Do not shave 48 hours prior to surgery. Men may shave face and neck.  Contacts and dentures may not be worn into surgery.  Do not bring valuables to the hospital. [facility] is not responsible for any belongings or valuables.   TAKE THESE MEDICATIONS THE MORNING OF SURGERY WITH A SIP OF  WATER: BUPROPION ISOSORBIDE OMEPRAZOLE TAKE DOSE NIGHT BEFORE SURGERY  AND MORNING OF SURGERY RENEXA  Use CHG Soap or wipes as directed on instruction sheet.  Use inhalers on the day of surgery  Follow recommendations from Cardiologist, Pulmonologist or PCP     BRILINTA   Stop Anti-inflammatories such as Advil, Aleve, Ibuprofen, Motrin, Naproxen, Naprosyn, Goodie powder, or aspirin products. (May take Tylenol or Acetaminophen if needed.)  Stop ANY OVER THE COUNTER supplements until after surgery. (May continue Vitamin D, Vitamin B, and multivitamin.)  If you are being admitted to the hospital overnight, leave your suitcase in the car. After surgery it may be brought to your room.  If you are being discharged the day of surgery, you will not be allowed to drive home. You will need someone to drive you home and stay with you that night.   If you are taking public transportation, you will need to have a responsible adult to with you.  Please call the number above if you have any questions about these instructions.

## 2017-09-05 ENCOUNTER — Ambulatory Visit
Admission: RE | Admit: 2017-09-05 | Discharge: 2017-09-05 | Disposition: A | Discharge status: Home / Self Care | Payer: Medicaid Other | Source: Ambulatory Visit | Attending: Orthopedic Surgery | Admitting: Orthopedic Surgery

## 2017-09-05 ENCOUNTER — Ambulatory Visit: Payer: Medicaid Other | Admitting: Certified Registered"

## 2017-09-05 ENCOUNTER — Encounter
Admission: RE | Disposition: A | Discharge status: Home / Self Care | Payer: Self-pay | Source: Ambulatory Visit | Attending: Orthopedic Surgery

## 2017-09-05 ENCOUNTER — Encounter: Payer: Self-pay | Admitting: *Deleted

## 2017-09-05 DIAGNOSIS — I2510 Atherosclerotic heart disease of native coronary artery without angina pectoris: Secondary | ICD-10-CM | POA: Insufficient documentation

## 2017-09-05 DIAGNOSIS — K219 Gastro-esophageal reflux disease without esophagitis: Secondary | ICD-10-CM | POA: Diagnosis not present

## 2017-09-05 DIAGNOSIS — X58XXXA Exposure to other specified factors, initial encounter: Secondary | ICD-10-CM | POA: Insufficient documentation

## 2017-09-05 DIAGNOSIS — F209 Schizophrenia, unspecified: Secondary | ICD-10-CM | POA: Diagnosis not present

## 2017-09-05 DIAGNOSIS — I10 Essential (primary) hypertension: Secondary | ICD-10-CM | POA: Diagnosis not present

## 2017-09-05 DIAGNOSIS — Z955 Presence of coronary angioplasty implant and graft: Secondary | ICD-10-CM | POA: Diagnosis not present

## 2017-09-05 DIAGNOSIS — Z886 Allergy status to analgesic agent status: Secondary | ICD-10-CM | POA: Insufficient documentation

## 2017-09-05 DIAGNOSIS — F17200 Nicotine dependence, unspecified, uncomplicated: Secondary | ICD-10-CM | POA: Insufficient documentation

## 2017-09-05 DIAGNOSIS — J449 Chronic obstructive pulmonary disease, unspecified: Secondary | ICD-10-CM | POA: Insufficient documentation

## 2017-09-05 DIAGNOSIS — Z888 Allergy status to other drugs, medicaments and biological substances status: Secondary | ICD-10-CM | POA: Diagnosis not present

## 2017-09-05 DIAGNOSIS — M170 Bilateral primary osteoarthritis of knee: Secondary | ICD-10-CM | POA: Diagnosis not present

## 2017-09-05 DIAGNOSIS — Z7982 Long term (current) use of aspirin: Secondary | ICD-10-CM | POA: Diagnosis not present

## 2017-09-05 DIAGNOSIS — M160 Bilateral primary osteoarthritis of hip: Secondary | ICD-10-CM | POA: Diagnosis not present

## 2017-09-05 DIAGNOSIS — F419 Anxiety disorder, unspecified: Secondary | ICD-10-CM | POA: Insufficient documentation

## 2017-09-05 DIAGNOSIS — F319 Bipolar disorder, unspecified: Secondary | ICD-10-CM | POA: Diagnosis not present

## 2017-09-05 DIAGNOSIS — B1920 Unspecified viral hepatitis C without hepatic coma: Secondary | ICD-10-CM | POA: Insufficient documentation

## 2017-09-05 DIAGNOSIS — Z79899 Other long term (current) drug therapy: Secondary | ICD-10-CM | POA: Insufficient documentation

## 2017-09-05 DIAGNOSIS — S83271A Complex tear of lateral meniscus, current injury, right knee, initial encounter: Secondary | ICD-10-CM | POA: Diagnosis not present

## 2017-09-05 DIAGNOSIS — S83231A Complex tear of medial meniscus, current injury, right knee, initial encounter: Secondary | ICD-10-CM | POA: Diagnosis not present

## 2017-09-05 DIAGNOSIS — Z8673 Personal history of transient ischemic attack (TIA), and cerebral infarction without residual deficits: Secondary | ICD-10-CM | POA: Insufficient documentation

## 2017-09-05 DIAGNOSIS — M65861 Other synovitis and tenosynovitis, right lower leg: Secondary | ICD-10-CM | POA: Insufficient documentation

## 2017-09-05 DIAGNOSIS — I252 Old myocardial infarction: Secondary | ICD-10-CM | POA: Diagnosis not present

## 2017-09-05 HISTORY — PX: KNEE ARTHROSCOPY WITH MEDIAL MENISECTOMY: SHX5651

## 2017-09-05 LAB — URINE DRUG SCREEN, QUALITATIVE (ARMC ONLY)
AMPHETAMINES, UR SCREEN: NOT DETECTED
Barbiturates, Ur Screen: NOT DETECTED
Benzodiazepine, Ur Scrn: NOT DETECTED
COCAINE METABOLITE, UR ~~LOC~~: NOT DETECTED
Cannabinoid 50 Ng, Ur ~~LOC~~: NOT DETECTED
MDMA (ECSTASY) UR SCREEN: NOT DETECTED
METHADONE SCREEN, URINE: NOT DETECTED
OPIATE, UR SCREEN: NOT DETECTED
Phencyclidine (PCP) Ur S: NOT DETECTED
Tricyclic, Ur Screen: NOT DETECTED

## 2017-09-05 SURGERY — ARTHROSCOPY, KNEE, WITH MEDIAL MENISCECTOMY
Anesthesia: General | Site: Knee | Laterality: Right | Wound class: Clean

## 2017-09-05 MED ORDER — HYDROCODONE-ACETAMINOPHEN 5-325 MG PO TABS: 1.0000 | ORAL_TABLET | ORAL | Status: DC | PRN

## 2017-09-05 MED ORDER — FENTANYL CITRATE (PF) 100 MCG/2ML IJ SOLN
INTRAMUSCULAR | Status: AC
  Administered 2017-09-05: 50 ug via INTRAVENOUS
  Filled 2017-09-05: qty 2

## 2017-09-05 MED ORDER — FENTANYL CITRATE (PF) 100 MCG/2ML IJ SOLN
25.0000 ug | INTRAMUSCULAR | Status: DC | PRN
  Administered 2017-09-05 (×3): 50 ug via INTRAVENOUS

## 2017-09-05 MED ORDER — GLYCOPYRROLATE 0.2 MG/ML IJ SOLN
INTRAMUSCULAR | Status: DC | PRN
  Administered 2017-09-05: 0.2 mg via INTRAVENOUS

## 2017-09-05 MED ORDER — HYDROCODONE-ACETAMINOPHEN 5-325 MG PO TABS: 1.0000 | ORAL_TABLET | Freq: Four times a day (QID) | ORAL | 0 refills | Status: DC | PRN

## 2017-09-05 MED ORDER — ONDANSETRON HCL 4 MG/2ML IJ SOLN
INTRAMUSCULAR | Status: DC | PRN
  Administered 2017-09-05: 4 mg via INTRAVENOUS

## 2017-09-05 MED ORDER — BUPIVACAINE-EPINEPHRINE (PF) 0.5% -1:200000 IJ SOLN
INTRAMUSCULAR | Status: AC
  Filled 2017-09-05: qty 30

## 2017-09-05 MED ORDER — EPHEDRINE SULFATE 50 MG/ML IJ SOLN
INTRAMUSCULAR | Status: AC
  Filled 2017-09-05: qty 1

## 2017-09-05 MED ORDER — OXYCODONE HCL 5 MG/5ML PO SOLN
ORAL | Status: AC
  Filled 2017-09-05: qty 5

## 2017-09-05 MED ORDER — METOCLOPRAMIDE HCL 10 MG PO TABS: 5.0000 mg | ORAL_TABLET | Freq: Three times a day (TID) | ORAL | Status: DC | PRN

## 2017-09-05 MED ORDER — MIDAZOLAM HCL 2 MG/2ML IJ SOLN
INTRAMUSCULAR | Status: AC
  Filled 2017-09-05: qty 2

## 2017-09-05 MED ORDER — PROPOFOL 10 MG/ML IV BOLUS
INTRAVENOUS | Status: DC | PRN
  Administered 2017-09-05: 200 mg via INTRAVENOUS

## 2017-09-05 MED ORDER — PROPOFOL 10 MG/ML IV BOLUS
INTRAVENOUS | Status: AC
  Filled 2017-09-05: qty 20

## 2017-09-05 MED ORDER — SODIUM CHLORIDE 0.9 % IV SOLN: INTRAVENOUS | Status: DC

## 2017-09-05 MED ORDER — LACTATED RINGERS IV SOLN
INTRAVENOUS | Status: DC
  Administered 2017-09-05: 12:00:00 via INTRAVENOUS

## 2017-09-05 MED ORDER — FENTANYL CITRATE (PF) 100 MCG/2ML IJ SOLN
INTRAMUSCULAR | Status: DC | PRN
  Administered 2017-09-05 (×5): 50 ug via INTRAVENOUS

## 2017-09-05 MED ORDER — MEPERIDINE HCL 50 MG/ML IJ SOLN: 6.2500 mg | INTRAMUSCULAR | Status: DC | PRN

## 2017-09-05 MED ORDER — DEXAMETHASONE SODIUM PHOSPHATE 10 MG/ML IJ SOLN
INTRAMUSCULAR | Status: DC | PRN
  Administered 2017-09-05: 5 mg via INTRAVENOUS

## 2017-09-05 MED ORDER — GLYCOPYRROLATE 0.2 MG/ML IJ SOLN
INTRAMUSCULAR | Status: AC
  Filled 2017-09-05: qty 1

## 2017-09-05 MED ORDER — LIDOCAINE HCL (PF) 2 % IJ SOLN: INTRAMUSCULAR | Status: DC | PRN

## 2017-09-05 MED ORDER — PROMETHAZINE HCL 25 MG/ML IJ SOLN: 6.2500 mg | INTRAMUSCULAR | Status: DC | PRN

## 2017-09-05 MED ORDER — ONDANSETRON HCL 4 MG/2ML IJ SOLN: 4.0000 mg | Freq: Four times a day (QID) | INTRAMUSCULAR | Status: DC | PRN

## 2017-09-05 MED ORDER — FENTANYL CITRATE (PF) 250 MCG/5ML IJ SOLN
INTRAMUSCULAR | Status: AC
  Filled 2017-09-05: qty 5

## 2017-09-05 MED ORDER — OXYCODONE HCL 5 MG PO TABS: 5.0000 mg | ORAL_TABLET | Freq: Once | ORAL | Status: AC | PRN

## 2017-09-05 MED ORDER — DEXAMETHASONE SODIUM PHOSPHATE 10 MG/ML IJ SOLN
INTRAMUSCULAR | Status: AC
  Filled 2017-09-05: qty 1

## 2017-09-05 MED ORDER — LIDOCAINE HCL (PF) 2 % IJ SOLN
INTRAMUSCULAR | Status: DC | PRN
  Administered 2017-09-05: 100 mg via INTRADERMAL

## 2017-09-05 MED ORDER — LIDOCAINE HCL (PF) 2 % IJ SOLN
INTRAMUSCULAR | Status: AC
  Filled 2017-09-05: qty 10

## 2017-09-05 MED ORDER — MIDAZOLAM HCL 5 MG/5ML IJ SOLN
INTRAMUSCULAR | Status: DC | PRN
  Administered 2017-09-05: 2 mg via INTRAVENOUS

## 2017-09-05 MED ORDER — EPHEDRINE SULFATE 50 MG/ML IJ SOLN
INTRAMUSCULAR | Status: DC | PRN
  Administered 2017-09-05: 10 mg via INTRAVENOUS

## 2017-09-05 MED ORDER — METOCLOPRAMIDE HCL 5 MG/ML IJ SOLN: 5.0000 mg | Freq: Three times a day (TID) | INTRAMUSCULAR | Status: DC | PRN

## 2017-09-05 MED ORDER — ONDANSETRON HCL 4 MG PO TABS: 4.0000 mg | ORAL_TABLET | Freq: Four times a day (QID) | ORAL | Status: DC | PRN

## 2017-09-05 MED ORDER — BUPIVACAINE-EPINEPHRINE (PF) 0.5% -1:200000 IJ SOLN
INTRAMUSCULAR | Status: DC | PRN
  Administered 2017-09-05: 20 mL

## 2017-09-05 MED ORDER — ONDANSETRON HCL 4 MG/2ML IJ SOLN
INTRAMUSCULAR | Status: AC
  Filled 2017-09-05: qty 2

## 2017-09-05 MED ORDER — OXYCODONE HCL 5 MG/5ML PO SOLN
5.0000 mg | Freq: Once | ORAL | Status: AC | PRN
  Administered 2017-09-05: 5 mg via ORAL

## 2017-09-05 SURGICAL SUPPLY — 25 items
BANDAGE ACE 4X5 VEL STRL LF (GAUZE/BANDAGES/DRESSINGS) IMPLANT
BLADE INCISOR PLUS 4.5 (BLADE) ×1 IMPLANT
CHLORAPREP W/TINT 26ML (MISCELLANEOUS) ×2 IMPLANT
CUFF TOURN 24 STER (MISCELLANEOUS) IMPLANT
CUFF TOURN 30 STER DUAL PORT (MISCELLANEOUS) ×1 IMPLANT
GAUZE SPONGE 4X4 12PLY STRL (GAUZE/BANDAGES/DRESSINGS) ×2 IMPLANT
GLOVE SURG SYN 9.0  PF PI (GLOVE) ×1
GLOVE SURG SYN 9.0 PF PI (GLOVE) ×1 IMPLANT
GOWN SRG 2XL LVL 4 RGLN SLV (GOWNS) ×1 IMPLANT
GOWN STRL NON-REIN 2XL LVL4 (GOWNS) ×2
GOWN STRL REUS W/ TWL LRG LVL3 (GOWN DISPOSABLE) ×2 IMPLANT
GOWN STRL REUS W/TWL LRG LVL3 (GOWN DISPOSABLE) ×4
IV LACTATED RINGER IRRG 3000ML (IV SOLUTION) ×8
IV LR IRRIG 3000ML ARTHROMATIC (IV SOLUTION) ×2 IMPLANT
KIT TURNOVER KIT A (KITS) ×2 IMPLANT
MANIFOLD NEPTUNE II (INSTRUMENTS) ×2 IMPLANT
PACK ARTHROSCOPY KNEE (MISCELLANEOUS) ×2 IMPLANT
SCALPEL PROTECTED #11 DISP (BLADE) ×2 IMPLANT
SET TUBE SUCT SHAVER OUTFL 24K (TUBING) ×2 IMPLANT
SET TUBE TIP INTRA-ARTICULAR (MISCELLANEOUS) ×2 IMPLANT
SUT ETHILON 4-0 (SUTURE) ×2
SUT ETHILON 4-0 FS2 18XMFL BLK (SUTURE) ×1
SUTURE ETHLN 4-0 FS2 18XMF BLK (SUTURE) ×1 IMPLANT
TUBING ARTHRO INFLOW-ONLY STRL (TUBING) ×2 IMPLANT
WAND COBLATION FLOW 50 (SURGICAL WAND) ×2 IMPLANT

## 2017-09-05 NOTE — Discharge Instructions (Addendum)
Weightbearing as tolerated on the right leg.  Aspirin 325 mg daily.  Keep dressing clean and dry.  Try to minimize activity until recheck Friday  AMBULATORY SURGERY  DISCHARGE INSTRUCTIONS   1) The drugs that you were given will stay in your system until tomorrow so for the next 24 hours you should not:  A) Drive an automobile B) Make any legal decisions C) Drink any alcoholic beverage   2) You may resume regular meals tomorrow.  Today it is better to start with liquids and gradually work up to solid foods.  You may eat anything you prefer, but it is better to start with liquids, then soup and crackers, and gradually work up to solid foods.   3) Please notify your doctor immediately if you have any unusual bleeding, trouble breathing, redness and pain at the surgery site, drainage, fever, or pain not relieved by medication.    4) Additional Instructions:        Please contact your physician with any problems or Same Day Surgery at [phone], Monday through Friday 6 am to 4 pm, or [place] [facility] number at [phone].

## 2017-09-05 NOTE — Anesthesia Postprocedure Evaluation (Signed)
Anesthesia Post Note  Patient: Keith Gibbs  Procedure(s) Performed: KNEE ARTHROSCOPY WITH MEDIAL AND LATERAL  MENISECTOMY PARTIAL SYNOVECTOMY (Right Knee)  Patient location during evaluation: PACU Anesthesia Type: General Level of consciousness: awake and alert and oriented Pain management: pain level controlled Vital Signs Assessment: post-procedure vital signs reviewed and stable Respiratory status: spontaneous breathing, nonlabored ventilation and respiratory function stable Cardiovascular status: blood pressure returned to baseline and stable Postop Assessment: no signs of nausea or vomiting Anesthetic complications: no     Last Vitals:  Vitals:   09/05/17 1533 09/05/17 1540  BP: (!) 142/91   Pulse: 63 (!) 59  Resp: 10 11  Temp:    SpO2: 98% 97%    Last Pain:  Vitals:   09/05/17 1540  TempSrc:   PainSc: 5                  Asante Ritacco

## 2017-09-05 NOTE — Op Note (Signed)
09/05/2017  2:50 PM  PATIENT:  Keith Gibbs  58 y.o. male  PRE-OPERATIVE DIAGNOSIS:  COMPLEX TEAR OF MEDIAL MENISCUS OF RIGHT KNEE AS CURRENT INJURY with synovitis  POST-OPERATIVE DIAGNOSIS:  COMPLEX TEAR OF MEDIAL MENISCUS OF RIGHT KNEE AS CURRENT INJURY with synovitis  PROCEDURE:  Procedure(s): KNEE ARTHROSCOPY WITH MEDIAL AND LATERAL  MENISECTOMY PARTIAL SYNOVECTOMY (Right)  SURGEON: Laurene Footman, MD  ASSISTANTS: None  ANESTHESIA:   general  EBL:  Total I/O In: 600 [I.V.:600] Out: -   BLOOD ADMINISTERED:none  DRAINS: none   LOCAL MEDICATIONS USED:  MARCAINE     SPECIMEN:  No Specimen  DISPOSITION OF SPECIMEN:  N/A  COUNTS:  YES  TOURNIQUET:   Total Tourniquet Time Documented: Thigh (Right) - 13 minutes Total: Thigh (Right) - 13 minutes   IMPLANTS: None  DICTATION: .Dragon Dictation patient was brought to the operating room and after adequate general anesthesia was obtained the right leg was prepped and draped in usual sterile fashion with an arthroscopic leg holder and tourniquet applied.  After patient identification and timeout procedures were completed, an inferolateral portal was made and the arthroscope introduced.  Initial inspection revealed a great deal of synovitis adjacent to the patella and the medial aspect of the knee and into the anterior compartment of the knee.  The suprapatellar pouch was essentially normal with no loose bodies gutters were free of loose body.  The there is extensive patellofemoral arthritis with fissuring of the articular cartilage in the femoral trochlea and partial thickness loss over the entire patellar surface.  No exposed bone.  Tracking was normal.  Going into the medial compartment inferior medial portal was made and on probing there was a posterior horn tear that was complex and partly detached.  It had the appearance of an old bucket-handle tear with some of the meniscus having worn away.  There is also significant  articular wear to the medial femoral condyle with fissuring and ex a small area of exposed bone centrally in the weightbearing aspect but with nearly full-thickness cartilage loss over approximately 5 mm x 10 mm area.  There is partial thickness loss of the articular cartilage over the entire tibial surface.  There is no fissuring down to the bone or exposed bone on the tibial side.  An ArthroCare wand was used to ablate the meniscus back to a stable margin.  The notch was showed normal ACL but extensive synovitis.  Going to the lateral compartment there is extensive synovitis in the anterior compartment minimal degenerative change and a complex tear of the anterior horn of the lateral meniscus show no the ArthroCare wand and shaver used to debride the anterior joint horn tear and then the shaver was used with the tourniquet raised to perform a synovectomy in the anterior compartment and along the medial patellofemoral joint where there was impinging synovium.  After this was completed the knee was irrigated and still clear.  Wounds were closed with some interrupted 4-0 nylon and 20 cc half percent Sensorcaine infiltrated the portals.  Xeroform 4 x 4's web roll and Ace wrap applied  PLAN OF CARE: Discharge to home after PACU  PATIENT DISPOSITION:  PACU - hemodynamically stable.

## 2017-09-05 NOTE — Transfer of Care (Signed)
Immediate Anesthesia Transfer of Care Note  Patient: Keith Gibbs  Procedure(s) Performed: KNEE ARTHROSCOPY WITH MEDIAL AND LATERAL  MENISECTOMY PARTIAL SYNOVECTOMY (Right Knee)  Patient Location: PACU  Anesthesia Type:General  Level of Consciousness: sedated  Airway & Oxygen Therapy: Patient Spontanous Breathing and Patient connected to face mask oxygen  Post-op Assessment: Report given to RN and Post -op Vital signs reviewed and stable  Post vital signs: Reviewed  Last Vitals:  Vitals:   09/05/17 1140 09/05/17 1448  BP: (!) 141/75 102/71  Pulse: (!) 55 (!) 53  Resp: 20 (!) 9  Temp: 36.6 C   SpO2: 100% 99%    Last Pain:  Vitals:   09/05/17 1140  TempSrc: Tympanic  PainSc: 10-Worst pain ever      Patients Stated Pain Goal: 0 (36/64/40 3474)  Complications: No apparent anesthesia complications

## 2017-09-05 NOTE — OR Nursing (Signed)
Discussed discharge instructions with pt and family. Both voice understanding. 

## 2017-09-05 NOTE — Anesthesia Post-op Follow-up Note (Signed)
Anesthesia QCDR form completed.        

## 2017-09-05 NOTE — Anesthesia Procedure Notes (Signed)
Procedure Name: LMA Insertion Date/Time: 09/05/2017 1:54 PM Performed by: Rolla Plate, CRNA Pre-anesthesia Checklist: Patient identified, Patient being monitored, Timeout performed, Emergency Drugs available and Suction available Patient Re-evaluated:Patient Re-evaluated prior to induction Oxygen Delivery Method: Circle system utilized Preoxygenation: Pre-oxygenation with 100% oxygen Induction Type: IV induction Ventilation: Mask ventilation without difficulty LMA: LMA inserted LMA Size: 5.0 Tube type: Oral Number of attempts: 1 Placement Confirmation: positive ETCO2 and breath sounds checked- equal and bilateral Tube secured with: Tape Dental Injury: Teeth and Oropharynx as per pre-operative assessment

## 2017-09-05 NOTE — Anesthesia Preprocedure Evaluation (Signed)
Anesthesia Evaluation  Patient identified by MRN, date of birth, ID band Patient awake    Reviewed: Allergy & Precautions, NPO status , Patient's Chart, lab work & pertinent test results  History of Anesthesia Complications Negative for: history of anesthetic complications  Airway Mallampati: II  TM Distance: >3 FB Neck ROM: Full    Dental  (+) Edentulous Upper, Edentulous Lower   Pulmonary neg sleep apnea, COPD,  COPD inhaler, Current Smoker,    breath sounds clear to auscultation- rhonchi (-) wheezing      Cardiovascular hypertension, Pt. on medications (-) angina+ CAD, + Past MI and + Cardiac Stents (05/2016)   Rhythm:Regular Rate:Normal - Systolic murmurs and - Diastolic murmurs L heart cath 06/20/16:  A STENT XIENCE ALPINE RX [id number] drug eluting stent was successfully placed, and does not overlap previously placed stent.  Mid LAD lesion, 90 %stenosed.  Post intervention, there is a 0% residual stenosis.   Neuro/Psych PSYCHIATRIC DISORDERS Anxiety Depression Bipolar Disorder Schizophrenia CVA, No Residual Symptoms    GI/Hepatic GERD  ,(+) Hepatitis -, C  Endo/Other  negative endocrine ROSneg diabetes  Renal/GU negative Renal ROS     Musculoskeletal  (+) Arthritis ,   Abdominal (+) - obese,   Peds  Hematology negative hematology ROS (+)   Anesthesia Other Findings Past Medical History: No date: Anxiety No date: Arthritis No date: Bursitis No date: COPD (chronic obstructive pulmonary disease) (HCC) No date: Depression No date: GERD (gastroesophageal reflux disease) 2012: Hepatitis C     Comment:  No longer has Hep C No date: Hypertension No date: MI (myocardial infarction) ([place]) No date: Stroke [facility])   Reproductive/Obstetrics                             Anesthesia Physical Anesthesia Plan  ASA: III  Anesthesia Plan: General   Post-op Pain Management:    Induction:  Intravenous  PONV Risk Score and Plan: 0 and Ondansetron  Airway Management Planned: LMA  Additional Equipment:   Intra-op Plan:   Post-operative Plan:   Informed Consent: I have reviewed the patients History and Physical, chart, labs and discussed the procedure including the risks, benefits and alternatives for the proposed anesthesia with the patient or authorized representative who has indicated his/her understanding and acceptance.   Dental advisory given  Plan Discussed with: CRNA and Anesthesiologist  Anesthesia Plan Comments:         Anesthesia Quick Evaluation

## 2017-09-05 NOTE — H&P (Signed)
Reviewed paper H+P, will be scanned into chart. No changes noted.  

## 2017-09-06 ENCOUNTER — Encounter: Payer: Self-pay | Admitting: Orthopedic Surgery

## 2017-09-11 ENCOUNTER — Encounter: Payer: Self-pay | Admitting: Nurse Practitioner

## 2017-09-11 ENCOUNTER — Ambulatory Visit: Payer: Medicaid Other | Attending: Nurse Practitioner | Admitting: Nurse Practitioner

## 2017-09-11 VITALS — BP 118/61 | HR 61 | Temp 97.7°F | Resp 16 | Ht 75.0 in | Wt 214.0 lb

## 2017-09-11 DIAGNOSIS — F1411 Cocaine abuse, in remission: Secondary | ICD-10-CM | POA: Diagnosis not present

## 2017-09-11 DIAGNOSIS — I2510 Atherosclerotic heart disease of native coronary artery without angina pectoris: Secondary | ICD-10-CM | POA: Diagnosis not present

## 2017-09-11 DIAGNOSIS — I10 Essential (primary) hypertension: Secondary | ICD-10-CM | POA: Insufficient documentation

## 2017-09-11 DIAGNOSIS — K219 Gastro-esophageal reflux disease without esophagitis: Secondary | ICD-10-CM | POA: Insufficient documentation

## 2017-09-11 DIAGNOSIS — E079 Disorder of thyroid, unspecified: Secondary | ICD-10-CM | POA: Diagnosis not present

## 2017-09-11 DIAGNOSIS — M47812 Spondylosis without myelopathy or radiculopathy, cervical region: Secondary | ICD-10-CM | POA: Diagnosis not present

## 2017-09-11 DIAGNOSIS — M25561 Pain in right knee: Secondary | ICD-10-CM

## 2017-09-11 DIAGNOSIS — Z79891 Long term (current) use of opiate analgesic: Secondary | ICD-10-CM

## 2017-09-11 DIAGNOSIS — Z8673 Personal history of transient ischemic attack (TIA), and cerebral infarction without residual deficits: Secondary | ICD-10-CM | POA: Diagnosis not present

## 2017-09-11 DIAGNOSIS — M899 Disorder of bone, unspecified: Secondary | ICD-10-CM | POA: Diagnosis not present

## 2017-09-11 DIAGNOSIS — Z113 Encounter for screening for infections with a predominantly sexual mode of transmission: Secondary | ICD-10-CM | POA: Insufficient documentation

## 2017-09-11 DIAGNOSIS — B182 Chronic viral hepatitis C: Secondary | ICD-10-CM | POA: Insufficient documentation

## 2017-09-11 DIAGNOSIS — Z87891 Personal history of nicotine dependence: Secondary | ICD-10-CM | POA: Insufficient documentation

## 2017-09-11 DIAGNOSIS — M25552 Pain in left hip: Secondary | ICD-10-CM | POA: Diagnosis not present

## 2017-09-11 DIAGNOSIS — M25562 Pain in left knee: Secondary | ICD-10-CM | POA: Diagnosis not present

## 2017-09-11 DIAGNOSIS — I252 Old myocardial infarction: Secondary | ICD-10-CM | POA: Insufficient documentation

## 2017-09-11 DIAGNOSIS — M25511 Pain in right shoulder: Secondary | ICD-10-CM

## 2017-09-11 DIAGNOSIS — G894 Chronic pain syndrome: Secondary | ICD-10-CM

## 2017-09-11 DIAGNOSIS — F419 Anxiety disorder, unspecified: Secondary | ICD-10-CM | POA: Insufficient documentation

## 2017-09-11 DIAGNOSIS — J449 Chronic obstructive pulmonary disease, unspecified: Secondary | ICD-10-CM | POA: Insufficient documentation

## 2017-09-11 DIAGNOSIS — M545 Low back pain, unspecified: Secondary | ICD-10-CM | POA: Insufficient documentation

## 2017-09-11 DIAGNOSIS — F1190 Opioid use, unspecified, uncomplicated: Secondary | ICD-10-CM

## 2017-09-11 DIAGNOSIS — F209 Schizophrenia, unspecified: Secondary | ICD-10-CM | POA: Insufficient documentation

## 2017-09-11 DIAGNOSIS — Z955 Presence of coronary angioplasty implant and graft: Secondary | ICD-10-CM | POA: Insufficient documentation

## 2017-09-11 DIAGNOSIS — Z789 Other specified health status: Secondary | ICD-10-CM

## 2017-09-11 DIAGNOSIS — Z7982 Long term (current) use of aspirin: Secondary | ICD-10-CM | POA: Diagnosis not present

## 2017-09-11 DIAGNOSIS — M25551 Pain in right hip: Secondary | ICD-10-CM

## 2017-09-11 DIAGNOSIS — Z79899 Other long term (current) drug therapy: Secondary | ICD-10-CM | POA: Diagnosis not present

## 2017-09-11 DIAGNOSIS — Z886 Allergy status to analgesic agent status: Secondary | ICD-10-CM | POA: Diagnosis not present

## 2017-09-11 DIAGNOSIS — G8929 Other chronic pain: Secondary | ICD-10-CM

## 2017-09-11 DIAGNOSIS — F319 Bipolar disorder, unspecified: Secondary | ICD-10-CM | POA: Insufficient documentation

## 2017-09-11 NOTE — Progress Notes (Signed)
Safety precautions to be maintained throughout the outpatient stay will include: orient to surroundings, keep bed in low position, maintain call bell within reach at all times, provide assistance with transfer out of bed and ambulation.  

## 2017-09-11 NOTE — Progress Notes (Signed)
Patient's Name: Keith Gibbs  MRN: [id number]  Referring Provider: Jodi Marble, MD  DOB: 1960-10-07  PCP: Jodi Marble, MD  DOS: 09/11/2017  Note by: Dionisio David NP  Service setting: Ambulatory outpatient  Specialty: Interventional Pain Management  Location: [facility] (AMB) Pain Management Facility    Patient type: New Patient    Primary Reason(s) for Visit: Initial Patient Evaluation CC: Back Pain (lower) and Knee Pain (bilateral)  HPI  [name] is a 57 y.o. year old, male patient, who comes today for an initial evaluation. He has Schizophrenia ([place]); Osteoarthritis; Shoulder pain; COPD (chronic obstructive pulmonary disease) ([place]); Chronic hepatitis C ([place]); GERD (gastroesophageal reflux disease); Swollen testicle; Hip pain, left; Abnormal ejaculation; Chest pain, unspecified; Bipolar 1 disorder ([place]); Cocaine abuse in remission ([place]); Complete rupture of rotator cuff; Degenerative arthritis of hip; Myalgia and myositis; Chest pain; Angina at rest [facility]); Severe recurrent major depression without psychotic features ([place]); Coronary artery disease; Alcohol use disorder, moderate, dependence ([place]); Cocaine use disorder, moderate, dependence ([place]); Cannabis use disorder, moderate, dependence ([place]); Overdose of medication, intentional self-harm, subsequent encounter; Tobacco use disorder; Chronic pain of both knees (Primary Area of Pain)  (Bilateral) (R>L); Chronic bilateral low back pain without sciatica (Secondary Area of Pain); Chronic pain of both hips [facility] Area of Pain) (Bilateral) (R>L); Chronic right shoulder pain (Fourth Area of Pain); Spondylosis of cervical region without myelopathy or radiculopathy; Chronic pain syndrome; Long term current use of opiate analgesic; Opiate use; Pharmacologic therapy; Disorder of skeletal system; and Problems influencing health status on their problem list.. His primarily concern today is the Back Pain (lower) and Knee Pain (bilateral)  Pain  Assessment: Location: Lower Back Radiating: does not radiate Onset: More than a month ago Duration: Chronic pain Quality: Aching, Constant, Pounding Severity: 10-Worst pain ever/10 (self-reported pain score)  Note: Reported level is compatible with observation. Clinically the patient looks like a 1/10 A 1/10 is viewed as "Mild" and described as nagging, annoying, but not interfering with basic activities of daily living (ADL). [name] is able to eat, bathe, get dressed, do toileting (being able to get on and off the toilet and perform personal hygiene functions), transfer (move in and out of bed or a chair without assistance), and maintain continence (able to control bladder and bowel functions). Physiologic parameters such as blood pressure and heart rate apear wnl. Information on the proper use of the pain scale provided to the patient today. When using our objective Pain Scale, levels between 6 and 10/10 are said to belong in an emergency room, as it progressively worsens from a 6/10, described as severely limiting, requiring emergency care not usually available at an outpatient pain management facility. At a 6/10 level, communication becomes difficult and requires great effort. Assistance to reach the emergency department may be required. Facial flushing and profuse sweating along with potentially dangerous increases in heart rate and blood pressure will be evident. Effect on ADL: cant stand, stairs, prolonged walking, Timing: Constant Modifying factors: heat, ice, medications  Onset and Duration: Sudden, Gradual and Present longer than 3 months Cause of pain: Unknown Severity: Getting worse, NAS-11 at its worse: 9/10, NAS-11 at its best: 9/10, NAS-11 now: 10/10 and NAS-11 on the average: 8/10 Timing: Morning, Night, During activity or exercise, After activity or exercise and After a period of immobility Aggravating Factors: Bending, Climbing, Kneeling, Lifiting, Motion, Nerve blocks,  Prolonged sitting, Prolonged standing, Squatting, Stooping , Twisting, Walking, Walking uphill, Walking downhill and Working Alleviating Factors: Cold  packs, Hot packs, Lying down, Medications, Resting, Sitting, Sleeping and Warm showers or baths Associated Problems: Constipation, Day-time cramps, Night-time cramps, Depression, Dizziness, Erectile dysfunction, Inability to concentrate, Numbness, Personality changes, Spasms, Sweating, Swelling, Tingling, Weakness, Pain that wakes patient up and Pain that does not allow patient to sleep Quality of Pain: Agonizing, Annoying, Cramping, Deep, Disabling, Distressing, Dull, Exhausting, Feeling of constriction, Getting longer, Heavy, Horrible, Itching, Punishing, Sharp, Shooting, Stabbing, Tender, Throbbing, Tiring and Uncomfortable Previous Examinations or Tests: CT scan, MRI scan, Nerve block, X-rays, Neurological evaluation, Neurosurgical evaluation, Orthopedic evaluation and Psychiatric evaluation Previous Treatments: Epidural steroid injections, Narcotic medications, Physical Therapy, Steroid treatments by mouth, TENS and Trigger point injections  The patient comes into the clinics today for the first time for a chronic pain management evaluation. According to the patient his primary area of pain is in his knees. He admits that the right is greater than the left. He is status post right arthroscopic surgery on 09/05/17 by Dr Rudene Christians. He admits that he has history of torn meniscus, arthritis and spurring in the knee. He admits that he did have cortisone injections 2018 which were not effective. He denies any physical therapy. He did have recent images of the right knee. He denies images of left knee.  His second area of pain is in his lower back. He admits that his midline. He describes a constant aching pain.He denies any radiating pain. He is status post surgery around 2012. He denies any previous interventional therapy prior to surgery. He admits that he did  have physical therapy which was not effective but did help with stiffness. He does wear a brace and using a cane.  His third area of pain is in his hips. He admits that the right is greater than the left. He admits that he did have a cortisone injection a few months ago which was somewhat effective. He denies any physical therapy.  His fourth area of pain is in his right shoulder. He describes the pain as a dull ache. He denies any numbness tingling in his arms but admits that he does have weakness. He admits that he had surgery approximately 8 years ago at [facility].  He denies any previous interventional therapy, physical therapy or recent images.  His last area of pain is in his neck. He admits that it is midline. He denies any radiating pain. He denies any previous surgeries, interventional therapy or physical therapy. He denies any recent images.  Today I took the time to provide the patient with information regarding this pain practice. The patient was informed that the practice is divided into two sections: an interventional pain management section, as well as a completely separate and distinct medication management section. I explained that there are procedure days for interventional therapies, and evaluation days for follow-ups and medication management. Because of the amount of documentation required during both, they are kept separated. This means that there is the possibility that he may be scheduled for a procedure on one day, and medication management the next. I have also informed him that because of staffing and facility limitations, this practice will no longer take patients for medication management only. To illustrate the reasons for this, I gave the patient the example of surgeons, and how inappropriate it would be to refer a patient to his/her care, just to write for the post-surgical antibiotics on a surgery done by a different surgeon.   Because interventional pain management is part of  the board-certified specialty for the  doctors, the patient was informed that joining this practice means that they are open to any and all interventional therapies. I made it clear that this does not mean that they will be forced to have any procedures done. What this means is that I believe interventional therapies to be essential part of the diagnosis and proper management of chronic pain conditions. Therefore, patients not interested in these interventional alternatives will be better served under the care of a different practitioner.  The patient was also made aware of my Comprehensive Pain Management Safety Guidelines where by joining this practice, they limit all of their nerve blocks and joint injections to those done by our practice, for as long as we are retained to manage their care. Historic Controlled Substance Pharmacotherapy Review  PMP and historical list of controlled substances: Hydrocodone/acetaminophen 5/325, OxyContin ER 10 mg, oxycodone/acetaminophen 5/325 mg, lorazepam 0.5 mg, oxycodone 5 mg, tramadol 50 mg, oxycodone/acetaminophen 7.5/325 mg, morphine sulfate 15 mg, oxycodone/acetaminophen 7.5/500 Highest opioid analgesic regimen found: Oxycodone 5 mg 2 tablets every 4 hours (fill date 12/25/14) oxycodone 60 mg per day Most recent opioid analgesic: Hydrocodone/acetaminophen 5/325 mg  1 tablet in the 4 hours(fill date 09/05/2017) hydrocodone 30 mg per day Current opioid analgesics: Hydrocodone/acetaminophen 5/325 mg  1 tablet in the 4 hours(fill date 09/05/2017) hydrocodone 30 mg per day Highest recorded MME/day: 90 mg/day MME/day: 60 mg/day Medications: The patient did not bring the medication(s) to the appointment, as requested in our "New Patient Package" Pharmacodynamics: Desired effects: Analgesia: The patient reports >50% benefit. Reported improvement in function: The patient reports medication allows him to accomplish basic ADLs. Clinically meaningful improvement in  function (CMIF): Sustained CMIF goals met Perceived effectiveness: Described as relatively effective, allowing for increase in activities of daily living (ADL) Undesirable effects: Side-effects or Adverse reactions: None reported Historical Monitoring: The patient  reports that he does not use drugs. List of all UDS Test(s): Lab Results  Component Value Date   MDMA NONE DETECTED 09/05/2017   MDMA NONE DETECTED 01/05/2017   MDMA NONE DETECTED 12/26/2016   MDMA NONE DETECTED 09/24/2015   COCAINSCRNUR NONE DETECTED 09/05/2017   COCAINSCRNUR NONE DETECTED 01/05/2017   COCAINSCRNUR POSITIVE (A) 12/26/2016   COCAINSCRNUR POSITIVE (A) 09/24/2015   PCPSCRNUR NONE DETECTED 09/05/2017   PCPSCRNUR NONE DETECTED 01/05/2017   PCPSCRNUR NONE DETECTED 12/26/2016   PCPSCRNUR NONE DETECTED 09/24/2015   THCU NONE DETECTED 09/05/2017   THCU NONE DETECTED 01/05/2017   THCU NONE DETECTED 12/26/2016   THCU POSITIVE (A) 09/24/2015   ETH 29 (H) 12/26/2016   ETH <5 09/24/2015   List of all Serum Drug Screening Test(s):  No results found for: AMPHSCRSER, BARBSCRSER, BENZOSCRSER, COCAINSCRSER, PCPSCRSER, PCPQUANT, THCSCRSER, CANNABQUANT, OPIATESCRSER, OXYSCRSER, PROPOXSCRSER Historical Background Evaluation: [place] PDMP: Six (6) year initial data search conducted.             [place] Department of public safety, offender search: Editor, commissioning Information) Non-contributory Risk Assessment Profile: Aberrant behavior: None observed or detected today Risk factors for fatal opioid overdose: None identified today Fatal overdose hazard ratio (HR): Calculation deferred Non-fatal overdose hazard ratio (HR): Calculation deferred Risk of opioid abuse or dependence: 0.7-3.0% with doses ? 36 MME/day and 6.1-26% with doses ? 120 MME/day. Substance use disorder (SUD) risk level: Pending results of Medical Psychology Evaluation for SUD Opioid risk tool (ORT) (Total Score):    ORT Scoring interpretation table:  Score <3 = Low Risk  for SUD  Score between 4-7 = Moderate Risk for SUD  Score >  8 = High Risk for Opioid Abuse   PHQ-2 Depression Scale:  Total score: 1  PHQ-2 Scoring interpretation table: (Score and probability of major depressive disorder)  Score 0 = No depression  Score 1 = 15.4% Probability  Score 2 = 21.1% Probability  Score 3 = 38.4% Probability  Score 4 = 45.5% Probability  Score 5 = 56.4% Probability  Score 6 = 78.6% Probability   PHQ-9 Depression Scale:  Total score: 1  PHQ-9 Scoring interpretation table:  Score 0-4 = No depression  Score 5-9 = Mild depression  Score 10-14 = Moderate depression  Score 15-19 = Moderately severe depression  Score 20-27 = Severe depression (2.4 times higher risk of SUD and 2.89 times higher risk of overuse)   Pharmacologic Plan: Pending ordered tests and/or consults  Meds  The patient has a current medication list which includes the following prescription(s): acetaminophen, albuterol, aspirin ec, atorvastatin, bupropion, cetirizine, cyclobenzaprine, dulera, hydrocodone-acetaminophen, isosorbide mononitrate, melatonin, multivitamin, nitroglycerin, omeprazole, prazosin, ranolazine, ropinirole, ticagrelor, and tamsulosin.  Current Outpatient Medications on File Prior to Visit  Medication Sig  . acetaminophen (TYLENOL) 500 MG tablet Take 2 tablets (1,000 mg total) by mouth every 6 (six) hours as needed. For pain.  Marland Kitchen albuterol (PROAIR HFA) 108 (90 Base) MCG/ACT inhaler Inhale 2 puffs into the lungs every 6 (six) hours as needed for wheezing or shortness of breath.  Marland Kitchen aspirin EC 81 MG tablet Take 1 tablet (81 mg total) by mouth daily.  Marland Kitchen atorvastatin (LIPITOR) 40 MG tablet Take 1 tablet (40 mg total) by mouth daily at 6 PM.  . buPROPion (WELLBUTRIN SR) 100 MG 12 hr tablet Take 100 mg by mouth 2 (two) times daily.  . cetirizine (ZYRTEC) 10 MG tablet Take 10 mg by mouth daily.  . cyclobenzaprine (FLEXERIL) 10 MG tablet Take 1 tablet (10 mg total) by mouth 3 (three)  times daily as needed for muscle spasms. (Patient taking differently: Take 10 mg by mouth 2 (two) times daily. )  . DULERA 200-5 MCG/ACT AERO TAKE 2 PUFFS BY MOUTH TWICE A DAY  . HYDROcodone-acetaminophen (NORCO) 5-325 MG tablet Take 1-2 tablets by mouth every 6 (six) hours as needed for moderate pain.  . isosorbide mononitrate (IMDUR) 30 MG 24 hr tablet Take 1 tablet (30 mg total) by mouth daily.  . Melatonin 3 MG TABS Take 3 mg by mouth at bedtime.  . Multiple Vitamin (MULTIVITAMIN) tablet Take 1 tablet by mouth daily.  . nitroGLYCERIN (NITROSTAT) 0.4 MG SL tablet Place 1 tablet (0.4 mg total) under the tongue every 5 (five) minutes x 3 doses as needed for chest pain.  Marland Kitchen omeprazole (PRILOSEC) 40 MG capsule Take 40 mg by mouth daily.  . prazosin (MINIPRESS) 1 MG capsule Take 1 capsule (1 mg total) by mouth at bedtime.  . ranolazine (RANEXA) 500 MG 12 hr tablet Take 500 mg by mouth 2 (two) times daily.  Marland Kitchen rOPINIRole (REQUIP) 0.5 MG tablet TAKE 1 TO 2 TABLETS BY MOUTH TWICE DAILY AS NEEDED FOR RESTLESS LEGS.  . ticagrelor (BRILINTA) 90 MG TABS tablet Take 1 tablet (90 mg total) by mouth 2 (two) times daily.  . tamsulosin (FLOMAX) 0.4 MG CAPS capsule Take 1 capsule (0.4 mg total) by mouth daily. (Patient not taking: Reported on 09/05/2017)   No current facility-administered medications on file prior to visit.    Imaging Review  Cervical Imaging:  Cervical DG complete:  Results for orders placed during the hospital encounter of 03/08/12  DG Cervical Spine  Complete   Narrative *RADIOLOGY REPORT*  Clinical Data: Upper extremity tingling  CERVICAL SPINE - COMPLETE 4+ VIEW  Comparison: None.  Findings: Five views of the cervical spine submitted.  No acute fracture or subluxation.  There is disc space flattening with mild anterior spurring at C5-C6 and C6-C7 level.  Narrowing of the neural foramina noted bilaterally at C5-C6 and C6-C7 level.  No prevertebral soft tissue swelling.  Cervical  airway is patent.  C1- C2 relationship is unremarkable.  IMPRESSION: No acute fracture or subluxation.  Degenerative changes at C5-C6 and C6-C7 level as described above.  Original Report Authenticated By: Lahoma Crocker, M.D.    Lumbosacral Imaging:  Lumbar DG 2-3 views:  Results for orders placed during the hospital encounter of 08/14/17  DG Lumbar Spine 2-3 Views   Narrative CLINICAL DATA:  Low back pain extending into right groin.  EXAM: LUMBAR SPINE - 2-3 VIEW  COMPARISON:  01/19/2017  FINDINGS: Patient is status post fusion from L4-S1 posteriorly. No hardware complicating feature. Normal alignment. Limbus vertebra noted at L3, stable, a normal variant. No acute bony abnormality. No fracture or malalignment. Disc spaces maintained. SI joints are symmetric and unremarkable.  IMPRESSION: Stable postoperative changes of posterior fusion from L4-S1.  No acute bony abnormality.   Electronically Signed   By: Rolm Baptise M.D.   On: 08/14/2017 12:01    Lumbar DG (Complete) 4+V:  Results for orders placed during the hospital encounter of 01/19/17  DG Lumbar Spine Complete   Narrative CLINICAL DATA:  Low back pain for 2 days, injured lifting  EXAM: LUMBAR SPINE - COMPLETE 4+ VIEW  COMPARISON:  CT of abdomen pelvis of 09/01/2016  FINDINGS: The lumbar vertebrae remain in normal alignment. Hardware for posterior fusion from L4-S1 is stable ith no complicating features noted. Interbody fusion devices at L4-5 and L5-S1 appear unchanged in position compared to the prior CT images. No compression deformity is noted. A limbus vertebra which is a normal variation is present at L3. No compression deformity is seen. The SI joints are corticated.  IMPRESSION: Normal alignment. Stable posterior fusion from L4-S1. No acute abnormality.   Electronically Signed   By: Ivar Drape M.D.   On: 01/19/2017 12:05    Knee Imaging:  Results for orders placed during the hospital  encounter of 08/02/17  MR KNEE RIGHT WO CONTRAST   Narrative CLINICAL DATA:  Injured knee 2 months ago. Persistent pain and swelling.  EXAM: MRI OF THE RIGHT KNEE WITHOUT CONTRAST  TECHNIQUE: Multiplanar, multisequence MR imaging of the knee was performed. No intravenous contrast was administered.  COMPARISON:  Radiographs 03/04/2017  FINDINGS: MENISCI  Medial meniscus: Complex flap type tear involving the posterior horn and mid body junction region with flipped meniscal fragment between the medial femoral condyle and medial capsule. Associated adjacent synovitis.  Lateral meniscus: Horizontal cleavage-type tear involving the anterior horn and mid body junction region.  LIGAMENTS  Cruciates:  Intact.  Mild mucoid degeneration of the ACL.  Collaterals:  Intact.  MCL and pes anserine bursitis.  CARTILAGE  Patellofemoral:  Mild to moderate degenerative chondrosis.  Medial: Advanced degenerative chondrosis with areas of full or near full-thickness cartilage loss, joint space narrowing, early spurring and subchondral edema.  Lateral: Moderate degenerative chondrosis with early joint space narrowing and spurring.  Joint: Large joint effusion and moderate synovitis. Small loose ossified body in the posterior joint space, posterior to the PCL.  Popliteal Fossa: No popliteal mass or Baker's cyst. Mild semimembranosus bursitis.  Extensor  Mechanism: The patella retinacular structures are intact and the quadriceps and patellar tendons are intact.  Bones: No acute bony findings. Patchy areas of marrow edema likely stress related.  Other: The knee musculature is grossly normal.  IMPRESSION: 1. Medial and lateral meniscal tears as described above. 2. Intact ligamentous structures and no acute bony findings. Significant tricompartmental degenerative changes. 3. Large joint effusion and moderate synovitis. Small loose ossified body noted in the posterior joint  space.   Electronically Signed   By: Marijo Sanes M.D.   On: 08/02/2017 09:46    Knee-R DG 4 views:  Results for orders placed during the hospital encounter of 03/04/17  DG Knee Complete 4 Views Right   Narrative CLINICAL DATA:  Right knee pain. Rotational injury. Swelling. Pain is worse with bending.  EXAM: RIGHT KNEE - COMPLETE 4+ VIEW  COMPARISON:  Right knee radiographs 01/30/2014  FINDINGS: A large joint effusion is present. The knee is located. No acute fractures are present.  IMPRESSION: 1. Large joint effusion. 2. No acute osseous abnormality.   Electronically Signed   By: San Morelle M.D.   On: 03/04/2017 16:02      Note: Available results from prior imaging studies were reviewed.        ROS  Cardiovascular History: Heart trouble, Daily Aspirin intake, Chest pain, Heart attack ( Date: 2018), Heart valve problems and Blood thinners:  Antiplatelet Pulmonary or Respiratory History: Lung problems, Wheezing and difficulty taking a deep full breath (Asthma), Shortness of breath and No reported pulmonary signs or symptoms such as wheezing and difficulty taking a deep full breath (Asthma), difficulty blowing air out (Emphysema), coughing up mucus (Bronchitis), persistent dry cough, or temporary stoppage of breathing during sleep Neurological History: Stroke (Residual deficits or weakness: none noted) and No reported neurological signs or symptoms such as seizures, abnormal skin sensations, urinary and/or fecal incontinence, being born with an abnormal open spine and/or a tethered spinal cord Review of Past Neurological Studies:  Results for orders placed or performed during the hospital encounter of 12/26/16  CT Head Wo Contrast   Narrative   CLINICAL DATA:  Initial evaluation for acute dizziness for 2-3 months, syncope.  EXAM: CT HEAD WITHOUT CONTRAST  TECHNIQUE: Contiguous axial images were obtained from the base of the skull through the vertex  without intravenous contrast.  COMPARISON:  None.  FINDINGS: Brain: Cerebral volume within normal limits for patient age.  No evidence for acute intracranial hemorrhage. No findings to suggest acute large vessel territory infarct. No mass lesion, midline shift, or mass effect. Ventricles are normal in size without evidence for hydrocephalus. No extra-axial fluid collection identified.  Vascular: No hyperdense vessel identified.  Skull: Scalp soft tissues demonstrate no acute abnormality.Calvarium intact.  Sinuses/Orbits: Globes and orbital soft tissues are within normal limits.  Scattered mucosal thickening present throughout the row right ethmoidal air cells, right sphenoid sinus, and visualized right maxillary sinus. More mild left-sided paranasal sinus mucosal thickening noted. Mastoid air cells are clear.  IMPRESSION: 1. Normal head CT.  No acute intracranial process identified. 2. Moderate right-sided paranasal sinus disease, likely allergic/inflammatory nature.   Electronically Signed   By: Jeannine Boga M.D.   On: 12/27/2016 00:13    Psychological-Psychiatric History: Psychiatric disorder, Anxiousness, Depressed and Prone to panicking Gastrointestinal History: Reflux or heatburn and Inflamed liver (Hepatitis) Genitourinary History: Peeing blood Hematological History: Brusing easily and Bleeding easily Endocrine History: No reported endocrine signs or symptoms such as high or low blood sugar, rapid heart  rate due to high thyroid levels, obesity or weight gain due to slow thyroid or thyroid disease Rheumatologic History: No reported rheumatological signs and symptoms such as fatigue, joint pain, tenderness, swelling, redness, heat, stiffness, decreased range of motion, with or without associated rash Musculoskeletal History: Negative for myasthenia gravis, muscular dystrophy, multiple sclerosis or malignant hyperthermia Work History: Out of work due to  pain  Allergies  Mr. Gadea is allergic to latuda [lurasidone hcl]; naproxen; and saphris [asenapine].  Laboratory Chemistry  Inflammation Markers No results found for: CRP, ESRSEDRATE (CRP: Acute Phase) (ESR: Chronic Phase) Renal Function Markers Lab Results  Component Value Date   BUN 10 08/30/2017   CREATININE 0.81 08/30/2017   GFRAA >60 08/30/2017   GFRNONAA >60 08/30/2017   Hepatic Function Markers Lab Results  Component Value Date   AST 27 02/20/2017   ALT 31 02/20/2017   ALBUMIN 4.9 02/20/2017   ALKPHOS 58 02/20/2017   HCVAB >11.0 (H) 11/01/2016   Electrolytes Lab Results  Component Value Date   NA 134 (L) 08/30/2017   K 4.2 08/30/2017   CL 103 08/30/2017   CALCIUM 9.0 08/30/2017   Neuropathy Markers No results found for: [id number] Bone Pathology Markers Lab Results  Component Value Date   ALKPHOS 58 02/20/2017   CALCIUM 9.0 08/30/2017   Coagulation Parameters Lab Results  Component Value Date   INR 1.02 11/01/2016   LABPROT 13.4 11/01/2016   PLT 249 08/30/2017   Cardiovascular Markers Lab Results  Component Value Date   BNP 12.0 12/26/2016   HGB 15.4 08/30/2017   HCT 44.9 08/30/2017   Note: Lab results reviewed.  [place]  Drug: Mr. Fan  reports that he does not use drugs. Alcohol:  reports that he drinks alcohol. Tobacco:  reports that he quit smoking about 2 weeks ago. His smoking use included cigarettes. He smoked 0.50 packs per day. he has never used smokeless tobacco. Medical:  has a past medical history of Anxiety, Arthritis, Bursitis, COPD (chronic obstructive pulmonary disease) ([place]), Depression, GERD (gastroesophageal reflux disease), Hepatitis C (2012), Hypertension, MI (myocardial infarction) ([place]), and Stroke ([place]). Family: family history includes Early death in his father; Heart disease in his father and mother; Heart failure in his father; Hypertension in his father, mother, and sister; Osteoarthritis in his mother.  Past Surgical  History:  Procedure Laterality Date  . ABDOMINAL SURGERY     Intestines  . APPENDECTOMY    . BACK SURGERY    . CARDIAC CATHETERIZATION Left 06/10/2016   Procedure: Left Heart Cath and Coronary Angiography;  Surgeon: Dionisio David, MD;  Location: [facility];  Service: Cardiovascular;  Laterality: Left;  . CARDIAC CATHETERIZATION N/A 06/10/2016   Procedure: Coronary Stent Intervention;  Surgeon: Yolonda Kida, MD;  Location: [facility];  Service: Cardiovascular;  Laterality: N/A;  . COLONOSCOPY    . COLONOSCOPY WITH PROPOFOL N/A 01/05/2017   Procedure: COLONOSCOPY WITH PROPOFOL;  Surgeon: Jonathon Bellows, MD;  Location: [facility] ENDOSCOPY;  Service: Endoscopy;  Laterality: N/A;  . CORONARY ANGIOPLASTY WITH STENT PLACEMENT    . ESOPHAGOGASTRODUODENOSCOPY (EGD) WITH PROPOFOL N/A 01/05/2017   Procedure: ESOPHAGOGASTRODUODENOSCOPY (EGD) WITH PROPOFOL;  Surgeon: Jonathon Bellows, MD;  Location: [facility] ENDOSCOPY;  Service: Endoscopy;  Laterality: N/A;  . KNEE ARTHROSCOPY WITH MEDIAL MENISECTOMY Right 09/05/2017   Procedure: KNEE ARTHROSCOPY WITH MEDIAL AND LATERAL  MENISECTOMY PARTIAL SYNOVECTOMY;  Surgeon: Hessie Knows, MD;  Location: [facility] ORS;  Service: Orthopedics;  Laterality: Right;  . SHOULDER SURGERY Right 04/09/2012  Active Ambulatory Problems    Diagnosis Date Noted  . Schizophrenia ([place]) 06/27/2012  . Osteoarthritis 06/27/2012  . Shoulder pain 06/27/2012  . COPD (chronic obstructive pulmonary disease) ([place]) 06/27/2012  . Chronic hepatitis C ([place]) 06/27/2012  . GERD (gastroesophageal reflux disease) 06/27/2012  . Swollen testicle 07/27/2012  . Hip pain, left 08/29/2012  . Abnormal ejaculation 11/30/2012  . Chest pain, unspecified 11/30/2012  . Bipolar 1 disorder ([place]) 05/27/2013  . Cocaine abuse in remission ([place]) 12/05/2011  . Complete rupture of rotator cuff 02/10/2012  . Degenerative arthritis of hip 11/16/2012  . Myalgia and myositis 05/27/2013  . Chest pain  06/10/2016  . Angina at rest [facility]) 06/11/2016  . Severe recurrent major depression without psychotic features ([place]) 12/27/2016  . Coronary artery disease 12/27/2016  . Alcohol use disorder, moderate, dependence ([place]) 12/27/2016  . Cocaine use disorder, moderate, dependence ([place]) 12/27/2016  . Cannabis use disorder, moderate, dependence ([place]) 12/27/2016  . Overdose of medication, intentional self-harm, subsequent encounter 12/28/2016  . Tobacco use disorder 12/28/2016  . Chronic pain of both knees (Primary Area of Pain)  (Bilateral) (R>L) 09/11/2017  . Chronic bilateral low back pain without sciatica (Secondary Area of Pain) 09/11/2017  . Chronic pain of both hips [facility] Area of Pain) (Bilateral) (R>L) 09/11/2017  . Chronic right shoulder pain (Fourth Area of Pain) 09/11/2017  . Spondylosis of cervical region without myelopathy or radiculopathy 09/11/2017  . Chronic pain syndrome 09/11/2017  . Long term current use of opiate analgesic 09/11/2017  . Opiate use 09/11/2017  . Pharmacologic therapy 09/11/2017  . Disorder of skeletal system 09/11/2017  . Problems influencing health status 09/11/2017   Resolved Ambulatory Problems    Diagnosis Date Noted  . No Resolved Ambulatory Problems   Past Medical History:  Diagnosis Date  . Anxiety   . Arthritis   . Bursitis   . COPD (chronic obstructive pulmonary disease) ([place])   . Depression   . GERD (gastroesophageal reflux disease)   . Hepatitis C 2012  . Hypertension   . MI (myocardial infarction) ([place])   . Stroke [facility])    Constitutional Exam  General appearance: Well nourished, well developed, and well hydrated. In no apparent acute distress Vitals:   09/11/17 0813  BP: 118/61  Pulse: 61  Resp: 16  Temp: 97.7 F (36.5 C)  SpO2: 99%  Weight: 214 lb (97.1 kg)  Height: 6' 3"  (1.905 m)   BMI Assessment: Estimated body mass index is 26.75 kg/m as calculated from the following:   Height as of this encounter: 6' 3"  (1.905 m).    Weight as of this encounter: 214 lb (97.1 kg).  BMI interpretation table: BMI level Category Range association with higher incidence of chronic pain  <18 kg/m2 Underweight   18.5-24.9 kg/m2 Ideal body weight   25-29.9 kg/m2 Overweight Increased incidence by 20%  30-34.9 kg/m2 Obese (Class I) Increased incidence by 68%  35-39.9 kg/m2 Severe obesity (Class II) Increased incidence by 136%  >40 kg/m2 Extreme obesity (Class III) Increased incidence by 254%   BMI Readings from Last 4 Encounters:  09/11/17 26.75 kg/m  09/05/17 26.50 kg/m  08/30/17 27.50 kg/m  08/14/17 27.50 kg/m   Wt Readings from Last 4 Encounters:  09/11/17 214 lb (97.1 kg)  09/05/17 212 lb (96.2 kg)  08/30/17 220 lb (99.8 kg)  08/14/17 220 lb (99.8 kg)  Psych/Mental status: Alert, oriented x 3 (person, place, & time)       Eyes: PERLA Respiratory: No evidence of acute  respiratory distress  Cervical Spine Exam  Inspection: No masses, redness, or swelling Alignment: Symmetrical Functional ROM: Adequate ROM      Stability: No instability detected Muscle strength & Tone: Functionally intact Sensory: Unimpaired Palpation: Non-tender              Upper Extremity (UE) Exam    Side: Right upper extremity  Side: Left upper extremity  Inspection: No masses, redness, swelling, or asymmetry. No contractures  Inspection: No masses, redness, swelling, or asymmetry. No contractures  Functional ROM: Unrestricted ROM          Functional ROM: Unrestricted ROM          Muscle strength & Tone: Functionally intact  Muscle strength & Tone: Functionally intact  Sensory: Unimpaired  Sensory: Unimpaired  Palpation: No palpable anomalies              Palpation: No palpable anomalies              Specialized Test(s): Deferred         Specialized Test(s): Deferred          Thoracic Spine Exam  Inspection: No masses, redness, or swelling Alignment: Symmetrical Functional ROM: Unrestricted ROM Stability: No instability  detected Sensory: Unimpaired Muscle strength & Tone: No palpable anomalies  Lumbar Spine Exam  Inspection: Well healed scar from previous spine surgery detected Alignment: Symmetrical Functional ROM: Adequate ROM      Stability: No instability detected Muscle strength & Tone: Functionally intact Sensory: Unimpaired Palpation: Non-tender       Provocative Tests: Lumbar Hyperextension and rotation test: evaluation deferred today       Patrick's Maneuver: Unable to perform                  due to knee pain and previous surgery  Gait & Posture Assessment  Ambulation: Patient ambulates using a cane Gait: Relatively normal for age and body habitus Posture: WNL   Lower Extremity Exam    Side: Right lower extremity  Side: Left lower extremity  Inspection: 2 cloth Band-Aids visible   Inspection: No masses, redness, swelling, or asymmetry. No contractures  Functional ROM: Unrestricted ROM          Functional ROM: Unrestricted ROM          Muscle strength & Tone: Functionally intact  Muscle strength & Tone: Functionally intact  Sensory: Unimpaired  Sensory: Unimpaired  Palpation: Tender  Palpation: Tender   Assessment  Primary Diagnosis & Pertinent Problem List: The primary encounter diagnosis was Chronic pain of both knees (Primary Area of Pain)  (Bilateral) (R>L). Diagnoses of Chronic bilateral low back pain without sciatica (Secondary Area of Pain), Chronic pain of both hips (Tertiary Area of Pain) (Bilateral) (R>L), Chronic right shoulder pain (Fourth Area of Pain), Spondylosis of cervical region without myelopathy or radiculopathy, Chronic pain syndrome, Long term current use of opiate analgesic, Opiate use, Pharmacologic therapy, Disorder of skeletal system, and Problems influencing health status were also pertinent to this visit.  Visit Diagnosis: 1. Chronic pain of both knees (Primary Area of Pain)  (Bilateral) (R>L)   2. Chronic bilateral low back pain without sciatica (Secondary  Area of Pain)   3. Chronic pain of both hips [facility] Area of Pain) (Bilateral) (R>L)   4. Chronic right shoulder pain (Fourth Area of Pain)   5. Spondylosis of cervical region without myelopathy or radiculopathy   6. Chronic pain syndrome   7. Long term current use of opiate analgesic  8. Opiate use   9. Pharmacologic therapy   10. Disorder of skeletal system   11. Problems influencing health status    Plan of Care  Initial treatment plan:  Please be advised that as per protocol, today's visit has been an evaluation only. We have not taken over the patient's controlled substance management.  Problem-specific plan: No problem-specific Assessment & Plan notes found for this encounter.  Ordered Lab-work, Procedure(s), Referral(s), & Consult(s): Orders Placed This Encounter  Procedures  . DG Shoulder Right  . DG Cervical Spine Complete  . DG Knee 1-2 Views Left  . Compliance Drug Analysis, Ur  . Comp. Metabolic Panel (12)  . Magnesium  . Vitamin B12  . Sedimentation rate  . 25-Hydroxyvitamin D Lcms D2+D3  . C-reactive protein  . Ambulatory referral to Psychology   Pharmacotherapy: Medications ordered:  No orders of the defined types were placed in this encounter.  Medications administered during this visit: Makhari W. Desmith had no medications administered during this visit.   Pharmacotherapy under consideration:  Opioid Analgesics: The patient was informed that there is no guarantee that he would be a candidate for opioid analgesics. The decision will be made following CDC guidelines. This decision will be based on the results of diagnostic studies, as well as Mr. Bhatt's risk profile.  Membrane stabilizer: To be determined at a later time Muscle relaxant: To be determined at a later time NSAID: To be determined at a later time Other analgesic(s): To be determined at a later time   Interventional therapies under consideration: Mr. Blatz was informed that there is no  guarantee that he would be a candidate for interventional therapies. The decision will be based on the results of diagnostic studies, as well as Mr. Hamric's risk profile.  Possible procedure(s): Diagnostic left intra-articular knee injection Diagnostic bilateral genicular nerve block Possible bilateral genicular nerve RFA Diagnostic lumbar facet nerve block Possible bilateral lumbar facet RFA Diagnostic bilateral hip injections Diagnostic right suprascapular nerve block Possible right suprascapular nerve RFA Diagnostic bilateral cervical facet nerve block Possible bilateral cervical RFA    Provider-requested follow-up: Return for 2nd Visit, w/ Dr. Dossie Arbour, after [facility] eval.  No future appointments.  Primary Care Physician: Jodi Marble, MD Location: [facility] Note by:  Date: 09/11/2017; Time: 2:30 PM  Pain Score Disclaimer: We use the NRS-11 scale. This is a self-reported, subjective measurement of pain severity with only modest accuracy. It is used primarily to identify changes within a particular patient. It must be understood that outpatient pain scales are significantly less accurate that those used for research, where they can be applied under ideal controlled circumstances with minimal exposure to variables. In reality, the score is likely to be a combination of pain intensity and pain affect, where pain affect describes the degree of emotional arousal or changes in action readiness caused by the sensory experience of pain. Factors such as social and work situation, setting, emotional state, anxiety levels, expectation, and prior pain experience may influence pain perception and show large inter-individual differences that may also be affected by time variables.  Patient instructions provided during this appointment: Patient Instructions     ____________________________________________________________________________________________  Appointment Policy Summary  It is our goal and responsibility to provide the medical community with assistance in the evaluation and management of patients with chronic pain. Unfortunately our resources are limited. Because we do not have an unlimited amount of time, or available appointments, we are required to closely monitor and manage  their use. The following rules exist to maximize their use:  Patient's responsibilities: 1. Punctuality:  At what time should I arrive? You should be physically present in our office 30 minutes before your scheduled appointment. Your scheduled appointment is with your assigned healthcare provider. However, it takes 5-10 minutes to be "checked-in", and another 15 minutes for the nurses to do the admission. If you arrive to our office at the time you were given for your appointment, you will end up being at least 20-25 minutes late to your appointment with the provider. 2. Tardiness:  What happens if I arrive only a few minutes after my scheduled appointment time? You will need to reschedule your appointment. The cutoff is your appointment time. This is why it is so important that you arrive at least 30 minutes before that appointment. If you have an appointment scheduled for 10:00 AM and you arrive at 10:01, you will be required to reschedule your appointment.  3. Plan ahead:  Always assume that you will encounter traffic on your way in. Plan for it. If you are dependent on a driver, make sure they understand these rules and the need to arrive early. 4. Other appointments and responsibilities:  Avoid scheduling any other appointments before or after your pain clinic appointments.  5. Be prepared:  Write down everything that you need to discuss with your healthcare provider and give this information to the admitting nurse. Write down the medications that you will need  refilled. Bring your pills and bottles (even the empty ones), to all of your appointments, except for those where a procedure is scheduled. 6. No children or pets:  Find someone to take care of them. It is not appropriate to bring them in. 7. Scheduling changes:  We request "advanced notification" of any changes or cancellations. 8. Advanced notification:  Defined as a time period of more than 24 hours prior to the originally scheduled appointment. This allows for the appointment to be offered to other patients. 9. Rescheduling:  When a visit is rescheduled, it will require the cancellation of the original appointment. For this reason they both fall within the category of "Cancellations".  10. Cancellations:  They require advanced notification. Any cancellation less than 24 hours before the  appointment will be recorded as a "No Show". 11. No Show:  Defined as an unkept appointment where the patient failed to notify or declare to the practice their intention or inability to keep the appointment.  Corrective process for repeat offenders:  1. Tardiness: Three (3) episodes of rescheduling due to late arrivals will be recorded as one (1) "No Show". 2. Cancellation or reschedule: Three (3) cancellations or rescheduling will be recorded as one (1) "No Show". 3. "No Shows": Three (3) "No Shows" within a 12 month period will result in discharge from the practice.  ____________________________________________________________________________________________   ____________________________________________________________________________________________  Pain Scale  Introduction: The pain score used by this practice is the Verbal Numerical Rating Scale (VNRS-11). This is an 11-point scale. It is for adults and children 10 years or older. There are significant differences in how the pain score is reported, used, and applied. Forget everything you learned in the past and learn this scoring  system.  General Information: The scale should reflect your current level of pain. Unless you are specifically asked for the level of your worst pain, or your average pain. If you are asked for one of these two, then it should be understood that it is over the past 24  hours.  Basic Activities of Daily Living (ADL): Personal hygiene, dressing, eating, transferring, and using restroom.  Instructions: Most patients tend to report their level of pain as a combination of two factors, their physical pain and their psychosocial pain. This last one is also known as "suffering" and it is reflection of how physical pain affects you socially and psychologically. From now on, report them separately. From this point on, when asked to report your pain level, report only your physical pain. Use the following table for reference.  Pain Clinic Pain Levels (0-5/10)  Pain Level Score  Description  No Pain 0   Mild pain 1 Nagging, annoying, but does not interfere with basic activities of daily living (ADL). Patients are able to eat, bathe, get dressed, toileting (being able to get on and off the toilet and perform personal hygiene functions), transfer (move in and out of bed or a chair without assistance), and maintain continence (able to control bladder and bowel functions). Blood pressure and heart rate are unaffected. A normal heart rate for a healthy adult ranges from 60 to 100 bpm (beats per minute).   Mild to moderate pain 2 Noticeable and distracting. Impossible to hide from other people. More frequent flare-ups. Still possible to adapt and function close to normal. It can be very annoying and may have occasional stronger flare-ups. With discipline, patients may get used to it and adapt.   Moderate pain 3 Interferes significantly with activities of daily living (ADL). It becomes difficult to feed, bathe, get dressed, get on and off the toilet or to perform personal hygiene functions. Difficult to get in and out of  bed or a chair without assistance. Very distracting. With effort, it can be ignored when deeply involved in activities.   Moderately severe pain 4 Impossible to ignore for more than a few minutes. With effort, patients may still be able to manage work or participate in some social activities. Very difficult to concentrate. Signs of autonomic nervous system discharge are evident: dilated pupils (mydriasis); mild sweating (diaphoresis); sleep interference. Heart rate becomes elevated (>115 bpm). Diastolic blood pressure (lower number) rises above 100 mmHg. Patients find relief in laying down and not moving.   Severe pain 5 Intense and extremely unpleasant. Associated with frowning face and frequent crying. Pain overwhelms the senses.  Ability to do any activity or maintain social relationships becomes significantly limited. Conversation becomes difficult. Pacing back and forth is common, as getting into a comfortable position is nearly impossible. Pain wakes you up from deep sleep. Physical signs will be obvious: pupillary dilation; increased sweating; goosebumps; brisk reflexes; cold, clammy hands and feet; nausea, vomiting or dry heaves; loss of appetite; significant sleep disturbance with inability to fall asleep or to remain asleep. When persistent, significant weight loss is observed due to the complete loss of appetite and sleep deprivation.  Blood pressure and heart rate becomes significantly elevated. Caution: If elevated blood pressure triggers a pounding headache associated with blurred vision, then the patient should immediately seek attention at an urgent or emergency care unit, as these may be signs of an impending stroke.    Emergency Department Pain Levels (6-10/10)  Emergency Room Pain 6 Severely limiting. Requires emergency care and should not be seen or managed at an outpatient pain management facility. Communication becomes difficult and requires great effort. Assistance to reach the  emergency department may be required. Facial flushing and profuse sweating along with potentially dangerous increases in heart rate and blood pressure will be evident.  Distressing pain 7 Self-care is very difficult. Assistance is required to transport, or use restroom. Assistance to reach the emergency department will be required. Tasks requiring coordination, such as bathing and getting dressed become very difficult.   Disabling pain 8 Self-care is no longer possible. At this level, pain is disabling. The individual is unable to do even the most "basic" activities such as walking, eating, bathing, dressing, transferring to a bed, or toileting. Fine motor skills are lost. It is difficult to think clearly.   Incapacitating pain 9 Pain becomes incapacitating. Thought processing is no longer possible. Difficult to remember your own name. Control of movement and coordination are lost.   The worst pain imaginable 10 At this level, most patients pass out from pain. When this level is reached, collapse of the autonomic nervous system occurs, leading to a sudden drop in blood pressure and heart rate. This in turn results in a temporary and dramatic drop in blood flow to the brain, leading to a loss of consciousness. Fainting is one of the body's self defense mechanisms. Passing out puts the brain in a calmed state and causes it to shut down for a while, in order to begin the healing process.    Summary: 1. Refer to this scale when providing [place] with your pain level. 2. Be accurate and careful when reporting your pain level. This will help with your care. 3. Over-reporting your pain level will lead to loss of credibility. 4. Even a level of 1/10 means that there is pain and will be treated at our facility. 5. High, inaccurate reporting will be documented as "Symptom Exaggeration", leading to loss of credibility and suspicions of possible secondary gains such as obtaining more narcotics, or wanting to appear  disabled, for fraudulent reasons. 6. Only pain levels of 5 or below will be seen at our facility. 7. Pain levels of 6 and above will be sent to the Emergency Department and the appointment cancelled. ____________________________________________________________________________________________

## 2017-09-11 NOTE — Patient Instructions (Signed)

## 2017-09-16 LAB — COMP. METABOLIC PANEL (12)
A/G RATIO: 1.9 (ref 1.2–2.2)
ALBUMIN: 4.2 g/dL (ref 3.5–5.5)
AST: 15 IU/L (ref 0–40)
Alkaline Phosphatase: 71 IU/L (ref 39–117)
BILIRUBIN TOTAL: 0.4 mg/dL (ref 0.0–1.2)
BUN / CREAT RATIO: 11 (ref 9–20)
BUN: 11 mg/dL (ref 6–24)
CHLORIDE: 99 mmol/L (ref 96–106)
Calcium: 9 mg/dL (ref 8.7–10.2)
Creatinine, Ser: 0.98 mg/dL (ref 0.76–1.27)
GFR calc Af Amer: 99 mL/min/{1.73_m2} (ref >59)
GFR calc non Af Amer: 85 mL/min/{1.73_m2} (ref >59)
Globulin, Total: 2.2 g/dL (ref 1.5–4.5)
Glucose: 104 mg/dL — ABNORMAL HIGH (ref 65–99)
POTASSIUM: 4.7 mmol/L (ref 3.5–5.2)
SODIUM: 139 mmol/L (ref 134–144)
TOTAL PROTEIN: 6.4 g/dL (ref 6.0–8.5)

## 2017-09-16 LAB — 25-HYDROXY VITAMIN D LCMS D2+D3
25-Hydroxy, Vitamin D-2: <1 ng/mL
25-Hydroxy, Vitamin D-3: 35 ng/mL
25-Hydroxy, Vitamin D: 35 ng/mL

## 2017-09-16 LAB — C-REACTIVE PROTEIN: CRP: 24.6 mg/L — ABNORMAL HIGH (ref 0.0–4.9)

## 2017-09-16 LAB — COMPLIANCE DRUG ANALYSIS, UR

## 2017-09-16 LAB — MAGNESIUM: MAGNESIUM: 2.1 mg/dL (ref 1.6–2.3)

## 2017-09-16 LAB — VITAMIN B12: Vitamin B-12: 457 pg/mL (ref 232–1245)

## 2017-09-16 LAB — SEDIMENTATION RATE: Sed Rate: 16 mm/hr (ref 0–30)

## 2017-09-18 ENCOUNTER — Encounter: Payer: Self-pay | Admitting: Pain Medicine

## 2017-09-18 ENCOUNTER — Ambulatory Visit: Payer: Medicaid Other | Attending: Pain Medicine | Admitting: Pain Medicine

## 2017-09-18 ENCOUNTER — Telehealth: Payer: Self-pay | Admitting: *Deleted

## 2017-09-18 ENCOUNTER — Other Ambulatory Visit: Payer: Self-pay

## 2017-09-18 VITALS — BP 124/69 | HR 57 | Temp 97.9°F | Resp 18 | Ht 75.0 in | Wt 220.0 lb

## 2017-09-18 DIAGNOSIS — F419 Anxiety disorder, unspecified: Secondary | ICD-10-CM | POA: Diagnosis not present

## 2017-09-18 DIAGNOSIS — M545 Low back pain, unspecified: Secondary | ICD-10-CM

## 2017-09-18 DIAGNOSIS — M7918 Myalgia, other site: Secondary | ICD-10-CM | POA: Diagnosis not present

## 2017-09-18 DIAGNOSIS — M25561 Pain in right knee: Secondary | ICD-10-CM | POA: Diagnosis not present

## 2017-09-18 DIAGNOSIS — F319 Bipolar disorder, unspecified: Secondary | ICD-10-CM | POA: Diagnosis not present

## 2017-09-18 DIAGNOSIS — Z5181 Encounter for therapeutic drug level monitoring: Secondary | ICD-10-CM | POA: Insufficient documentation

## 2017-09-18 DIAGNOSIS — Z886 Allergy status to analgesic agent status: Secondary | ICD-10-CM | POA: Diagnosis not present

## 2017-09-18 DIAGNOSIS — M542 Cervicalgia: Secondary | ICD-10-CM | POA: Insufficient documentation

## 2017-09-18 DIAGNOSIS — M25551 Pain in right hip: Secondary | ICD-10-CM

## 2017-09-18 DIAGNOSIS — Z87891 Personal history of nicotine dependence: Secondary | ICD-10-CM | POA: Diagnosis not present

## 2017-09-18 DIAGNOSIS — F1220 Cannabis dependence, uncomplicated: Secondary | ICD-10-CM | POA: Insufficient documentation

## 2017-09-18 DIAGNOSIS — B182 Chronic viral hepatitis C: Secondary | ICD-10-CM | POA: Insufficient documentation

## 2017-09-18 DIAGNOSIS — M25562 Pain in left knee: Secondary | ICD-10-CM | POA: Diagnosis not present

## 2017-09-18 DIAGNOSIS — M25511 Pain in right shoulder: Secondary | ICD-10-CM | POA: Diagnosis not present

## 2017-09-18 DIAGNOSIS — Z79891 Long term (current) use of opiate analgesic: Secondary | ICD-10-CM | POA: Diagnosis not present

## 2017-09-18 DIAGNOSIS — M160 Bilateral primary osteoarthritis of hip: Secondary | ICD-10-CM | POA: Insufficient documentation

## 2017-09-18 DIAGNOSIS — G894 Chronic pain syndrome: Secondary | ICD-10-CM | POA: Diagnosis not present

## 2017-09-18 DIAGNOSIS — I252 Old myocardial infarction: Secondary | ICD-10-CM | POA: Insufficient documentation

## 2017-09-18 DIAGNOSIS — F1411 Cocaine abuse, in remission: Secondary | ICD-10-CM | POA: Diagnosis not present

## 2017-09-18 DIAGNOSIS — I2510 Atherosclerotic heart disease of native coronary artery without angina pectoris: Secondary | ICD-10-CM | POA: Insufficient documentation

## 2017-09-18 DIAGNOSIS — Z79899 Other long term (current) drug therapy: Secondary | ICD-10-CM | POA: Insufficient documentation

## 2017-09-18 DIAGNOSIS — M25552 Pain in left hip: Secondary | ICD-10-CM

## 2017-09-18 DIAGNOSIS — I10 Essential (primary) hypertension: Secondary | ICD-10-CM | POA: Insufficient documentation

## 2017-09-18 DIAGNOSIS — F209 Schizophrenia, unspecified: Secondary | ICD-10-CM | POA: Insufficient documentation

## 2017-09-18 DIAGNOSIS — M47812 Spondylosis without myelopathy or radiculopathy, cervical region: Secondary | ICD-10-CM | POA: Diagnosis not present

## 2017-09-18 DIAGNOSIS — M170 Bilateral primary osteoarthritis of knee: Secondary | ICD-10-CM | POA: Insufficient documentation

## 2017-09-18 DIAGNOSIS — G8929 Other chronic pain: Secondary | ICD-10-CM

## 2017-09-18 DIAGNOSIS — K219 Gastro-esophageal reflux disease without esophagitis: Secondary | ICD-10-CM | POA: Diagnosis not present

## 2017-09-18 DIAGNOSIS — Z7982 Long term (current) use of aspirin: Secondary | ICD-10-CM | POA: Insufficient documentation

## 2017-09-18 DIAGNOSIS — J449 Chronic obstructive pulmonary disease, unspecified: Secondary | ICD-10-CM | POA: Diagnosis not present

## 2017-09-18 DIAGNOSIS — Z8673 Personal history of transient ischemic attack (TIA), and cerebral infarction without residual deficits: Secondary | ICD-10-CM | POA: Insufficient documentation

## 2017-09-18 MED ORDER — CYCLOBENZAPRINE HCL 10 MG PO TABS: 10.0000 mg | ORAL_TABLET | Freq: Three times a day (TID) | ORAL | 0 refills | Status: DC | PRN

## 2017-09-18 MED ORDER — HYDROCODONE-ACETAMINOPHEN 5-325 MG PO TABS
1.0000 | ORAL_TABLET | Freq: Four times a day (QID) | ORAL | 0 refills | Status: DC | PRN
Start: 2017-09-18 — End: 2017-09-21

## 2017-09-18 NOTE — Progress Notes (Signed)
Nursing Pain Medication Assessment:  Safety precautions to be maintained throughout the outpatient stay will include: orient to surroundings, keep bed in low position, maintain call bell within reach at all times, provide assistance with transfer out of bed and ambulation.  Medication Inspection Compliance: Pill count conducted under aseptic conditions, in front of the patient. Neither the pills nor the bottle was removed from the patient's sight at any time. Once count was completed pills were immediately returned to the patient in their original bottle.  Medication: Hydrocodone/APAP Pill/Patch Count: 0 of 30 pills remain Pill/Patch Appearance: Markings consistent with prescribed medication Bottle Appearance: Standard pharmacy container. Clearly labeled. Filled Date: 02 / 19 / 2019 Last Medication intake:  Yesterday

## 2017-09-18 NOTE — Patient Instructions (Addendum)
__________You have been given a script today for Hydrocodone. You have a script for Flexeril to be picked up at your pharmacy.  You have been instructed to get xrays done in the medical mall.  __________________________________________________________________________________  Preparing for your procedure (without sedation)  Instructions: . Oral Intake: Do not eat or drink anything for at least 3 hours prior to your procedure. . Transportation: Unless otherwise stated by your physician, you may drive yourself after the procedure. . Blood Pressure Medicine: Take your blood pressure medicine with a sip of water the morning of the procedure. . Blood thinners:  . Diabetics on insulin: Notify the staff so that you can be scheduled 1st case in the morning. If your diabetes requires high dose insulin, take only  of your normal insulin dose the morning of the procedure and notify the staff that you have done so. . Preventing infections: Shower with an antibacterial soap the morning of your procedure.  . Build-up your immune system: Take 1000 mg of Vitamin C with every meal (3 times a day) the day prior to your procedure. Marland Kitchen Antibiotics: Inform the staff if you have a condition or reason that requires you to take antibiotics before dental procedures. . Pregnancy: If you are pregnant, call and cancel the procedure. . Sickness: If you have a cold, fever, or any active infections, call and cancel the procedure. . Arrival: You must be in the facility at least 30 minutes prior to your scheduled procedure. . Children: Do not bring any children with you. . Dress appropriately: Bring dark clothing that you would not mind if they get stained. . Valuables: Do not bring any jewelry or valuables.  Procedure appointments are reserved for interventional treatments only. Marland Kitchen No Prescription Refills. . No medication changes will be discussed during procedure appointments. . No disability issues will be  discussed.  Remember:  Regular Business hours are:  Monday to Thursday 8:00 AM to 4:00 PM  Provider's Schedule: Milinda Pointer, MD:  Procedure days: Tuesday and Thursday 7:30 AM to 4:00 PM  Gillis Santa, MD:  Procedure days: Monday and Wednesday 7:30 AM to 4:00 PM ____________________________________________________________________________________________  ____________________________________________________________________________________________  Medication Rules  Applies to: All patients receiving prescriptions (written or electronic).  Pharmacy of record: Pharmacy where electronic prescriptions will be sent. If written prescriptions are taken to a different pharmacy, please inform the nursing staff. The pharmacy listed in the electronic medical record should be the one where you would like electronic prescriptions to be sent.  Prescription refills: Only during scheduled appointments. Applies to both, written and electronic prescriptions.  NOTE: The following applies primarily to controlled substances (Opioid* Pain Medications).   Patient's responsibilities: 1. Pain Pills: Bring all pain pills to every appointment (except for procedure appointments). 2. Pill Bottles: Bring pills in original pharmacy bottle. Always bring newest bottle. Bring bottle, even if empty. 3. Medication refills: You are responsible for knowing and keeping track of what medications you need refilled. The day before your appointment, write a list of all prescriptions that need to be refilled. Bring that list to your appointment and give it to the admitting nurse. Prescriptions will be written only during appointments. If you forget a medication, it will not be "Called in", "Faxed", or "electronically sent". You will need to get another appointment to get these prescribed. 4. Prescription Accuracy: You are responsible for carefully inspecting your prescriptions before leaving our office. Have the discharge  nurse carefully go over each prescription with you, before taking them home. Make  sure that your name is accurately spelled, that your address is correct. Check the name and dose of your medication to make sure it is accurate. Check the number of pills, and the written instructions to make sure they are clear and accurate. Make sure that you are given enough medication to last until your next medication refill appointment. 5. Taking Medication: Take medication as prescribed. Never take more pills than instructed. Never take medication more frequently than prescribed. Taking less pills or less frequently is permitted and encouraged, when it comes to controlled substances (written prescriptions).  6. Inform other Doctors: Always inform, all of your healthcare providers, of all the medications you take. 7. Pain Medication from other Providers: You are not allowed to accept any additional pain medication from any other Doctor or Healthcare provider. There are two exceptions to this rule. (see below) In the event that you require additional pain medication, you are responsible for notifying us, as stated below. 8. Medication Agreement: You are responsible for carefully reading and following our Medication Agreement. This must be signed before receiving any prescriptions from our practice. Safely store a copy of your signed Agreement. Violations to the Agreement will result in no further prescriptions. (Additional copies of our Medication Agreement are available upon request.) 9. Laws, Rules, & Regulations: All patients are expected to follow all Federal and [employer], [employer], Rules, [employer]. Ignorance of the Laws does not constitute a valid excuse. The use of any illegal substances is prohibited. 10. Adopted CDC guidelines & recommendations: Target dosing levels will be at or below 60 MME/day. Use of benzodiazepines** is not recommended.  Exceptions: There are only two exceptions to the rule of not  receiving pain medications from other Healthcare Providers. 1. Exception #1 (Emergencies): In the event of an emergency (i.e.: accident requiring emergency care), you are allowed to receive additional pain medication. However, you are responsible for: As soon as you are able, call our office [phone], at any time of the day or night, and leave a message stating your name, the date and nature of the emergency, and the name and dose of the medication prescribed. In the event that your call is answered by a member of our staff, make sure to document and save the date, time, and the name of the person that took your information.  2. Exception #2 (Planned Surgery): In the event that you are scheduled by another doctor or dentist to have any type of surgery or procedure, you are allowed (for a period no longer than 30 days), to receive additional pain medication, for the acute post-op pain. However, in this case, you are responsible for picking up a copy of our "Post-op Pain Management for Surgeons" handout, and giving it to your surgeon or dentist. This document is available at our office, and does not require an appointment to obtain it. Simply go to our office during business hours (Monday-Thursday from 8:00 AM to 4:00 PM) (Friday 8:00 AM to 12:00 Noon) or if you have a scheduled appointment with [place], prior to your surgery, and ask for it by name. In addition, you will need to provide [place] with your name, name of your surgeon, type of surgery, and date of procedure or surgery.  *Opioid medications include: morphine, codeine, oxycodone, oxymorphone, hydrocodone, hydromorphone, meperidine, tramadol, tapentadol, buprenorphine, fentanyl, methadone. **Benzodiazepine medications include: diazepam (Valium), alprazolam (Xanax), clonazepam (Klonopine), lorazepam (Ativan), clorazepate (Tranxene), chlordiazepoxide (Librium), estazolam (Prosom), oxazepam (Serax), temazepam (Restoril), triazolam  (Halcion) ____________________________________________________________________________________________ ____________________________________________________________________________________________  Medication  Recommendations and Reminders  Applies to: All patients receiving prescriptions (written and/or electronic).  Medication Rules & Regulations: These rules and regulations exist for your safety and that of others. They are not flexible and neither are we. Dismissing or ignoring them will be considered "non-compliance" with medication therapy, resulting in complete and irreversible termination of such therapy. (See document titled "Medication Rules" for more details.) In all conscience, because of safety reasons, we cannot continue providing a therapy where the patient does not follow instructions.  Pharmacy of record:   Definition: This is the pharmacy where your electronic prescriptions will be sent.   We do not endorse any particular pharmacy.  You are not restricted in your choice of pharmacy.  The pharmacy listed in the electronic medical record should be the one where you want electronic prescriptions to be sent.  If you choose to change pharmacy, simply notify our nursing staff of your choice of new pharmacy.  Recommendations:  Keep all of your pain medications in a safe place, under lock and key, even if you live alone.   After you fill your prescription, take 1 week's worth of pills and put them away in a safe place. You should keep a separate, properly labeled bottle for this purpose. The remainder should be kept in the original bottle. Use this as your primary supply, until it runs out. Once it's gone, then you know that you have 1 week's worth of medicine, and it is time to come in for a prescription refill. If you do this correctly, it is unlikely that you will ever run out of medicine.  To make sure that the above recommendation works, it is very important that you make  sure your medication refill appointments are scheduled at least 1 week before you run out of medicine. To do this in an effective manner, make sure that you do not leave the office without scheduling your next medication management appointment. Always ask the nursing staff to show you in your prescription , when your medication will be running out. Then arrange for the receptionist to get you a return appointment, at least 7 days before you run out of medicine. Do not wait until you have 1 or 2 pills left, to come in. This is very poor planning and does not take into consideration that we may need to cancel appointments due to bad weather, sickness, or emergencies affecting our staff.  Prescription refills and/or changes in medication(s):   Prescription refills, and/or changes in dose or medication, will be conducted only during scheduled medication management appointments. (Applies to both, written and electronic prescriptions.)  No medication will be changed or started on procedure days. No changes, adjustments, and/or refills will be conducted on a procedure day. Doing so will interfere with the diagnostic portion of the procedure.  No phone refills. No medications will be "called into the pharmacy".  No Fax refills.  No weekend refills.  No Holliday refills.  No after hours refills.  Remember:  Business hours are:  Monday to Thursday 8:00 AM to 4:00 PM Provider's Schedule: Dionisio David, NP - Appointments are:  Medication management: Monday to Thursday 8:00 AM to 4:00 PM Milinda Pointer, MD - Appointments are:  Medication management: Monday and Wednesday 8:00 AM to 4:00 PM Procedures: Tuesday and Thursday 7:30 AM to 4:00 PM Gillis Santa, MD - Appointments are:  Medication management: Tuesday and Thursday 8:00 AM to 4:00 PM Procedures days: Monday and Wednesday 7:30 AM to 4:00  PM ____________________________________________________________________________________________  ____________________________________________________________________________________________  Pain Prevention Technique  Definition:   A technique used to minimize the effects of an activity known to cause inflammation or swelling, which in turn leads to an increase in pain.  Purpose: To prevent swelling from occurring. It is based on the fact that it is easier to prevent swelling from happening than it is to get rid of it, once it occurs.  Contraindications: 1. Anyone with allergy or hypersensitivity to the recommended medications. 2. Anyone taking anticoagulants (Blood Thinners) (e.g., Coumadin, Warfarin, Plavix, etc.). 3. Patients in Renal Failure.  Technique: Before you undertake an activity known to cause pain, or a flare-up of your chronic pain, and before you experience any pain, do the following:  1. On a full stomach, take 4 (four) over the counter Ibuprofens 200mg  tablets (Motrin), for a total of 800 mg. 2. In addition, take over the counter Magnesium 400 to 500 mg, before doing the activity.  3. Six (6) hours later, again on a full stomach, repeat the Ibuprofen. 4. That night, take a warm shower and stretch under the running warm water.  This technique may be sufficient to abort the pain and discomfort before it happens. Keep in mind that it takes a lot less medication to prevent swelling than it takes to eliminate it once it occurs.  ____________________________________________________________________________________________  ____________________________________________________________________________________________  Drug Holidays (Slow)  What is a "Drug Holiday"? Drug Holiday: is the name given to the period of time during which a patient stops taking a medication(s) for the purpose of eliminating tolerance to the drug.  Benefits . Improved effectiveness of  opioids. . Decreased opioid dose needed to achieve benefits. . Improved pain with lesser dose.  What is tolerance? Tolerance: is the progressive decreased in effectiveness of a drug due to its repetitive use. With repetitive use, the body gets use to the medication and as a consequence, it loses its effectiveness. This is a common problem seen with opioid pain medications. As a result, a larger dose of the drug is needed to achieve the same effect that used to be obtained with a smaller dose.  How long should a "Drug Holiday" last? At least 14 consecutive days. (2 weeks)  What are withdrawals? Withdrawals: refers to the wide range of symptoms that occur after stopping or dramatically reducing opiate drugs after heavy and prolonged use. Withdrawal symptoms do not occur to patients that use low dose opioids, or those who take the medication sporadically. Contrary to benzodiazepine (example: Valium, Xanax, etc.) or alcohol withdrawals ("Delirium Tremens"), opioid withdrawals are not lethal. Withdrawals are the physical manifestation of the body getting rid of the excess receptors.  Expected Symptoms Early symptoms of withdrawal may include: . Agitation . Anxiety . Muscle aches . Increased tearing . Insomnia . Runny nose . Sweating . Yawning  Late symptoms of withdrawal may include: . Abdominal cramping . Diarrhea . Dilated pupils . Goose bumps . Nausea . Vomiting  Will I experience withdrawals? Due to the slow nature of the taper, it is very unlikely that you will experience any.  What is a slow taper? Taper: refers to the gradual decrease in dose. ___________________________________________________________________________________________

## 2017-09-18 NOTE — Progress Notes (Signed)
Patient's Name: Keith Gibbs  MRN: [id number]  Referring Provider: Jodi Marble, MD  DOB: 1960-08-19  PCP: Jodi Marble, MD  DOS: 09/18/2017  Note by: Gaspar Cola, MD  Service setting: Ambulatory outpatient  Specialty: Interventional Pain Management  Location: [facility] (AMB) Pain Management Facility    Patient type: Established   Primary Reason(s) for Visit: Encounter for evaluation before starting new chronic pain management plan of care (Level of risk: moderate) CC: Knee Pain (bilateral >R); Back Pain (low); and Hip Pain (right)  HPI  Keith Gibbs is a 57 y.o. year old, male patient, who comes today for a follow-up evaluation to review the test results and decide on a treatment plan. He has Schizophrenia ([place]); Osteoarthritis; COPD (chronic obstructive pulmonary disease) ([place]); Chronic hepatitis C ([place]); GERD (gastroesophageal reflux disease); Swollen testicle; Abnormal ejaculation; Chest pain, unspecified; Bipolar 1 disorder ([place]); Cocaine abuse in remission ([place]); Complete rupture of rotator cuff; Chest pain; Angina at rest [facility]); Severe recurrent major depression without psychotic features ([place]); Coronary artery disease; Alcohol use disorder, moderate, dependence ([place]); Cocaine use disorder, moderate, dependence ([place]); Cannabis use disorder, moderate, dependence ([place]); Overdose of medication, intentional self-harm, subsequent encounter; Tobacco use disorder; Chronic knee pain (Primary Area of Pain)  (Bilateral) (R>L); Chronic low back pain (Secondary Area of Pain) (Bilateral); Chronic hip pain (Tertiary Area of Pain) (Bilateral) (R>L); Chronic shoulder pain (Fourth Area of Pain) (Right); Spondylosis of cervical region without myelopathy or radiculopathy; Chronic pain syndrome; Long term current use of opiate analgesic; Opiate use; Pharmacologic therapy; Disorder of skeletal system; Problems influencing health status; 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); Chronic neck pain (Fifth  Area of Pain) ([place]); Chronic musculoskeletal pain; and 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 His primarily concern today is the Knee Pain (bilateral >R); Back Pain (low); and Hip Pain (right)  Pain Assessment: Location: Lower Back Radiating: right hip, knees Onset: More than a month ago Duration: Chronic pain Quality: Constant, Aching, Pounding Severity: 3 /10 (self-reported pain score)  Note: Reported level is compatible with observation.                         When using our objective Pain Scale, levels between 6 and 10/10 are said to belong in an emergency room, as it progressively worsens from a 6/10, described as severely limiting, requiring emergency care not usually available at an outpatient pain management facility. At a 6/10 level, communication becomes difficult and requires great effort. Assistance to reach the emergency department may be required. Facial flushing and profuse sweating along with potentially dangerous increases in heart rate and blood pressure will be evident. Timing: Constant Modifying factors: heat, ice, medications  Keith Gibbs comes in today for a follow-up visit after his initial evaluation on 09/11/2017. Today we went over the results of his tests. These were explained in "Layman's terms". During today's appointment we went over my diagnostic impression, as well as the proposed treatment plan.  According to the patient his primary area of pain is in his knees. He admits that the right is greater than the left. He is status post right arthroscopic surgery on 09/05/17 by Dr Rudene Christians. He admits that he has history of torn meniscus, arthritis and spurring in the knee. He admits that he did have cortisone injections 2018 which were not effective. He denies any physical therapy. He did have recent images of the right knee. He denies images of left knee. He still has  stitches on the right knee from the arthroscopy done by Dr. Rudene Christians. He is also still  having pain and swelling.  Lately he has been experiencing more pain on the left knee due to the fact that he has been favoring the right.  His second area of pain is in his lower back. He admits that his midline. He describes a constant aching pain. He denies any radiating pain. He is status post surgery around 2012. He denies any previous interventional therapy prior to surgery. He admits that he did have physical therapy which was not effective but did help with stiffness. He does wear a brace and using a cane.  His third area of pain is in his hips. He admits that the right is greater than the left. He admits that he did have a cortisone injection a few months ago which was somewhat effective. He denies any physical therapy.  His fourth area of pain is in his right shoulder. He describes the pain as a dull ache. He denies any numbness tingling in his arms but admits that he does have weakness. He admits that he had surgery approximately 8 years ago at [facility].  He denies any previous interventional therapy, physical therapy or recent images.  His last area of pain is in his neck. He admits that it is midline. He denies any radiating pain. He denies any previous surgeries, interventional therapy or physical therapy. He denies any recent images.  In considering the treatment plan options, Keith Gibbs was reminded that I no longer take patients for medication management only. I asked him to let me know if he had no intention of taking advantage of the interventional therapies, so that we could make arrangements to provide this space to someone interested. I also made it clear that undergoing interventional therapies for the purpose of getting pain medications is very inappropriate on the part of a patient, and it will not be tolerated in this practice. This type of behavior would suggest true addiction and therefore it requires referral to an addiction specialist.   Further details on both, my  assessment(s), as well as the proposed treatment plan, please see below.  Controlled Substance Pharmacotherapy Assessment REMS (Risk Evaluation and Mitigation Strategy)  Analgesic: Hydrocodone/acetaminophen 5/325 mg  1 tablet in the 4 hours(fill date 09/05/2017) hydrocodone 30 mg per day Highest recorded MME/day: 90 mg/day MME/day: 60 mg/day Pill Count: None expected due to no prior prescriptions written by our practice. Hart Rochester, RN  09/18/2017  8:19 AM  Sign at close encounter Nursing Pain Medication Assessment:  Safety precautions to be maintained throughout the outpatient stay will include: orient to surroundings, keep bed in low position, maintain call bell within reach at all times, provide assistance with transfer out of bed and ambulation.  Medication Inspection Compliance: Pill count conducted under aseptic conditions, in front of the patient. Neither the pills nor the bottle was removed from the patient's sight at any time. Once count was completed pills were immediately returned to the patient in their original bottle.  Medication: Hydrocodone/APAP Pill/Patch Count: 0 of 30 pills remain Pill/Patch Appearance: Markings consistent with prescribed medication Bottle Appearance: Standard pharmacy container. Clearly labeled. Filled Date: 02 / 19 / 2019 Last Medication intake:  Yesterday   Pharmacokinetics: Liberation and absorption (onset of action): WNL Distribution (time to peak effect): WNL Metabolism and excretion (duration of action): WNL         Pharmacodynamics: Desired effects: Analgesia: Keith Gibbs reports >50% benefit. Functional  ability: Patient reports that medication allows him to accomplish basic ADLs Clinically meaningful improvement in function (CMIF): Sustained CMIF goals met Perceived effectiveness: Described as relatively effective, allowing for increase in activities of daily living (ADL) Undesirable effects: Side-effects or Adverse reactions: None  reported Monitoring: [place] PMP: Online review of the past 20-monthperiod previously conducted. Not applicable at this point since we have not taken over the patient's medication management yet. List of other Serum/Urine Drug Screening Test(s):  Lab Results  Component Value Date   COCAINSCRNUR NONE DETECTED 09/05/2017   COCAINSCRNUR NONE DETECTED 01/05/2017   COCAINSCRNUR POSITIVE (A) 12/26/2016   COCAINSCRNUR POSITIVE (A) 09/24/2015   THCU NONE DETECTED 09/05/2017   THCU NONE DETECTED 01/05/2017   THCU NONE DETECTED 12/26/2016   THCU POSITIVE (A) 09/24/2015   ETH 29 (H) 12/26/2016   ETH <5 09/24/2015   List of all UDS test(s) done:  Lab Results  Component Value Date   SUMMARY FINAL 09/11/2017   Last UDS on record: Summary  Date Value Ref Range Status  09/11/2017 FINAL  Final    Comment:    ==================================================================== TOXASSURE COMP DRUG ANALYSIS,UR ==================================================================== Test                             Result       Flag       Units Drug Present and Declared for Prescription Verification   Hydrocodone                    1255         EXPECTED   ng/mg creat   Dihydrocodeine                 168          EXPECTED   ng/mg creat   Norhydrocodone                 820          EXPECTED   ng/mg creat    Sources of hydrocodone include scheduled prescription    medications. Dihydrocodeine and norhydrocodone are expected    metabolites of hydrocodone. Dihydrocodeine is also available as a    scheduled prescription medication.   Desmethylcyclobenzaprine       PRESENT      EXPECTED    Desmethylcyclobenzaprine is an expected metabolite of    cyclobenzaprine.   Bupropion                      PRESENT      EXPECTED   Hydroxybupropion               PRESENT      EXPECTED    Hydroxybupropion is an expected metabolite of bupropion.   Acetaminophen                  PRESENT      EXPECTED Drug Present not  Declared for Prescription Verification   Guaifenesin                    PRESENT      UNEXPECTED    Guaifenesin may be administered as an over-the-counter or    prescription drug; it may also be present as a breakdown product    of methocarbamol. Drug Absent but Declared for Prescription Verification   Salicylate  Not Detected UNEXPECTED    Aspirin, as indicated in the declared medication list, is not    always detected even when used as directed. ==================================================================== Test                      Result    Flag   Units      Ref Range   Creatinine              74               mg/dL      >=20 ==================================================================== Declared Medications:  The flagging and interpretation on this report are based on the  following declared medications.  Unexpected results may arise from  inaccuracies in the declared medications.  **Note: The testing scope of this panel includes these medications:  Bupropion  Cyclobenzaprine  Hydrocodone (Hydrocodone-Acetaminophen)  **Note: The testing scope of this panel does not include small to  moderate amounts of these reported medications:  Acetaminophen  Acetaminophen (Hydrocodone-Acetaminophen)  Aspirin (Aspirin 81)  **Note: The testing scope of this panel does not include following  reported medications:  Albuterol  Atorvastatin  Cetirizine  Isosorbide Mononitrate  Melatonin  Mometasone  Multivitamin  Nitroglycerin  Omeprazole  Prazosin (Minipress)  Ranolazine (Ranexa)  Ropinirole (Requip)  Tamsulosin (Flomax)  Ticagrelor (Brilinta) ==================================================================== For clinical consultation, please call [phone]. ====================================================================    UDS interpretation: Unexpected findings not considered significantly abnormal.          Medication Assessment Form:  Patient introduced to form today Treatment compliance: Treatment may start today if patient agrees with proposed plan. Evaluation of compliance is not applicable at this point Risk Assessment Profile: Aberrant behavior: Patient currently indicates having changed his ways.  He does have a history of cocaine, marijuana, and alcohol abuse, along with a history of schizophrenia.  He represents a very high risk for substance use disorder. Comorbid factors increasing risk of overdose: bipolar disorder, caucasian, high daily dosage , history of alcoholism, history of attempted suicide , history of previous overdose, history of substance abuse, history of substance use disorder, male gender and schizophrenia Medical Psychology Evaluation: Very High Risk Opioid Risk Tool - 09/18/17 0816      Family History of Substance Abuse   Alcohol  Positive Male    Illegal Drugs  Positive Male    Rx Drugs  Negative      Personal History of Substance Abuse   Alcohol  Negative    Illegal Drugs  Positive Male or Male    Rx Drugs  Negative      Age   Age between 62-45 years   No      History of Preadolescent Sexual Abuse   History of Preadolescent Sexual Abuse  Negative or Male      Psychological Disease   Psychological Disease  Positive anxiety   anxiety   Depression  Positive      Total Score   Opioid Risk Tool Scoring  13    Opioid Risk Interpretation  High Risk      ORT Scoring interpretation table:  Score <3 = Low Risk for SUD  Score between 4-7 = Moderate Risk for SUD  Score >8 = High Risk for Opioid Abuse   Risk Mitigation Strategies:  Patient opioid safety counseling: Completed today. Counseling provided to patient as per "Patient Counseling Document". Document signed by patient, attesting to counseling and understanding Patient-Prescriber Agreement (PPA): Obtained today.  Controlled substance notification to other  providers: Written and sent today.  Pharmacologic Plan: Today we will take  over the chronic pain medication management. From this point on, Keith Gibbs's medication agreement should be considered active.  Laboratory Chemistry  Inflammation Markers (CRP: Acute Phase) (ESR: Chronic Phase) Lab Results  Component Value Date   CRP 24.6 (H) 09/11/2017   ESRSEDRATE 16 09/11/2017   LATICACIDVEN 1.2 02/20/2017                         Rheumatology Markers Lab Results  Component Value Date   ANA Negative 11/01/2016                Renal Function Markers Lab Results  Component Value Date   BUN 11 09/11/2017   CREATININE 0.98 09/11/2017   GFRAA 99 09/11/2017   GFRNONAA 85 09/11/2017                 Hepatic Function Markers Lab Results  Component Value Date   AST 15 09/11/2017   ALT 31 02/20/2017   ALBUMIN 4.2 09/11/2017   ALKPHOS 71 09/11/2017   HCVAB >11.0 (H) 11/01/2016   LIPASE 45 02/20/2017                 Electrolytes Lab Results  Component Value Date   NA 139 09/11/2017   K 4.7 09/11/2017   CL 99 09/11/2017   CALCIUM 9.0 09/11/2017   MG 2.1 09/11/2017                        Neuropathy Markers Lab Results  Component Value Date   VITAMINB12 457 09/11/2017   HGBA1C 5.2 12/28/2016                 Bone Pathology Markers Lab Results  Component Value Date   25OHVITD1 35 09/11/2017   25OHVITD2 <1.0 09/11/2017   25OHVITD3 35 09/11/2017                         Coagulation Parameters Lab Results  Component Value Date   INR 1.02 11/01/2016   LABPROT 13.4 11/01/2016   PLT 249 08/30/2017                 Cardiovascular Markers Lab Results  Component Value Date   BNP 12.0 12/26/2016   TROPONINI <0.03 02/20/2017   HGB 15.4 08/30/2017   HCT 44.9 08/30/2017                 Note: Lab results reviewed.  Recent Diagnostic Imaging Review  Cervical Imaging: Cervical DG complete:  Results for orders placed during the hospital encounter of 03/08/12  DG Cervical Spine Complete   Narrative *RADIOLOGY REPORT*  Clinical Data: Upper  extremity tingling  CERVICAL SPINE - COMPLETE 4+ VIEW  Comparison: None.  Findings: Five views of the cervical spine submitted.  No acute fracture or subluxation.  There is disc space flattening with mild anterior spurring at C5-C6 and C6-C7 level.  Narrowing of the neural foramina noted bilaterally at C5-C6 and C6-C7 level.  No prevertebral soft tissue swelling.  Cervical airway is patent.  C1- C2 relationship is unremarkable.  IMPRESSION: No acute fracture or subluxation.  Degenerative changes at C5-C6 and C6-C7 level as described above.  Original Report Authenticated By: Lahoma Crocker, M.D.   Lumbosacral Imaging: Lumbar DG 2-3 views:  Results for orders placed during the hospital encounter of 08/14/17  DG Lumbar Spine  2-3 Views   Narrative CLINICAL DATA:  Low back pain extending into right groin.  EXAM: LUMBAR SPINE - 2-3 VIEW  COMPARISON:  01/19/2017  FINDINGS: Patient is status post fusion from L4-S1 posteriorly. No hardware complicating feature. Normal alignment. Limbus vertebra noted at L3, stable, a normal variant. No acute bony abnormality. No fracture or malalignment. Disc spaces maintained. SI joints are symmetric and unremarkable.  IMPRESSION: Stable postoperative changes of posterior fusion from L4-S1.  No acute bony abnormality.   Electronically Signed   By: Rolm Baptise M.D.   On: 08/14/2017 12:01    Lumbar DG (Complete) 4+V:  Results for orders placed during the hospital encounter of 01/19/17  DG Lumbar Spine Complete   Narrative CLINICAL DATA:  Low back pain for 2 days, injured lifting  EXAM: LUMBAR SPINE - COMPLETE 4+ VIEW  COMPARISON:  CT of abdomen pelvis of 09/01/2016  FINDINGS: The lumbar vertebrae remain in normal alignment. Hardware for posterior fusion from L4-S1 is stable ith no complicating features noted. Interbody fusion devices at L4-5 and L5-S1 appear unchanged in position compared to the prior CT images. No  compression deformity is noted. A limbus vertebra which is a normal variation is present at L3. No compression deformity is seen. The SI joints are corticated.  IMPRESSION: Normal alignment. Stable posterior fusion from L4-S1. No acute abnormality.   Electronically Signed   By: Ivar Drape M.D.   On: 01/19/2017 12:05    Knee Imaging: Knee-R MR wo contrast:  Results for orders placed during the hospital encounter of 08/02/17  MR KNEE RIGHT WO CONTRAST   Narrative CLINICAL DATA:  Injured knee 2 months ago. Persistent pain and swelling.  EXAM: MRI OF THE RIGHT KNEE WITHOUT CONTRAST  TECHNIQUE: Multiplanar, multisequence MR imaging of the knee was performed. No intravenous contrast was administered.  COMPARISON:  Radiographs 03/04/2017  FINDINGS: MENISCI  Medial meniscus: Complex flap type tear involving the posterior horn and mid body junction region with flipped meniscal fragment between the medial femoral condyle and medial capsule. Associated adjacent synovitis.  Lateral meniscus: Horizontal cleavage-type tear involving the anterior horn and mid body junction region.  LIGAMENTS  Cruciates:  Intact.  Mild mucoid degeneration of the ACL.  Collaterals:  Intact.  MCL and pes anserine bursitis.  CARTILAGE  Patellofemoral:  Mild to moderate degenerative chondrosis.  Medial: Advanced degenerative chondrosis with areas of full or near full-thickness cartilage loss, joint space narrowing, early spurring and subchondral edema.  Lateral: Moderate degenerative chondrosis with early joint space narrowing and spurring.  Joint: Large joint effusion and moderate synovitis. Small loose ossified body in the posterior joint space, posterior to the PCL.  Popliteal Fossa: No popliteal mass or Baker's cyst. Mild semimembranosus bursitis.  Extensor Mechanism: The patella retinacular structures are intact and the quadriceps and patellar tendons are intact.  Bones: No acute  bony findings. Patchy areas of marrow edema likely stress related.  Other: The knee musculature is grossly normal.  IMPRESSION: 1. Medial and lateral meniscal tears as described above. 2. Intact ligamentous structures and no acute bony findings. Significant tricompartmental degenerative changes. 3. Large joint effusion and moderate synovitis. Small loose ossified body noted in the posterior joint space.   Electronically Signed   By: Marijo Sanes M.D.   On: 08/02/2017 09:46    Knee-R DG 4 views:  Results for orders placed during the hospital encounter of 03/04/17  DG Knee Complete 4 Views Right   Narrative CLINICAL DATA:  Right knee pain. Rotational  injury. Swelling. Pain is worse with bending.  EXAM: RIGHT KNEE - COMPLETE 4+ VIEW  COMPARISON:  Right knee radiographs 01/30/2014  FINDINGS: A large joint effusion is present. The knee is located. No acute fractures are present.  IMPRESSION: 1. Large joint effusion. 2. No acute osseous abnormality.   Electronically Signed   By: San Morelle M.D.   On: 03/04/2017 16:02    Ankle Imaging: Ankle-L DG Complete:  Results for orders placed during the hospital encounter of 06/19/16  DG Ankle Complete Left   Narrative CLINICAL DATA:  Left ankle pain after falling off ladder.  EXAM: LEFT ANKLE COMPLETE - 3+ VIEW  COMPARISON:  None.  FINDINGS: There is no evidence of fracture, dislocation, or joint effusion. There is no evidence of arthropathy or other focal bone abnormality. Soft tissues are unremarkable.  IMPRESSION: Normal left ankle.   Electronically Signed   By: Marijo Conception, M.D.   On: 06/19/2016 20:49    Foot Imaging: Foot-R DG Complete:  Results for orders placed during the hospital encounter of 01/30/14  DG Foot Complete Right   Narrative CLINICAL DATA:  Right foot pain  EXAM: RIGHT FOOT COMPLETE - 3+ VIEW  COMPARISON:  None.  FINDINGS: Mild degenerative changes are noted at the  first interphalangeal joint. No acute fracture or dislocation is seen. No soft tissue changes are noted.  IMPRESSION: Degenerative change without acute abnormality.   Electronically Signed   By: Inez Catalina M.D.   On: 01/30/2014 20:23    Foot-L DG Complete:  Results for orders placed during the hospital encounter of 06/19/16  DG Foot Complete Left   Narrative CLINICAL DATA:  Fifth metatarsal pain. Fell from ladder hanging lights.  EXAM: LEFT FOOT - COMPLETE 3+ VIEW  COMPARISON:  None.  FINDINGS: No acute bone or soft tissue abnormality is present. There is no significant soft tissue slight lateral foot. Fifth metatarsal is intact. Go  IMPRESSION: Negative left foot radiographs.   Electronically Signed   By: San Morelle M.D.   On: 06/19/2016 20:47    Complexity Note: Imaging results reviewed. Results shared with Keith Gibbs, using Layman's terms.                         Meds   Current Outpatient Medications:  .  acetaminophen (TYLENOL) 500 MG tablet, Take 2 tablets (1,000 mg total) by mouth every 6 (six) hours as needed. For pain., Disp: 90 tablet, Rfl: 1 .  albuterol (PROAIR HFA) 108 (90 Base) MCG/ACT inhaler, Inhale 2 puffs into the lungs every 6 (six) hours as needed for wheezing or shortness of breath., Disp: 1 Inhaler, Rfl: 1 .  aspirin EC 81 MG tablet, Take 1 tablet (81 mg total) by mouth daily., Disp: 30 tablet, Rfl: 1 .  atorvastatin (LIPITOR) 40 MG tablet, Take 1 tablet (40 mg total) by mouth daily at 6 PM., Disp: 30 tablet, Rfl: 1 .  buPROPion (WELLBUTRIN SR) 100 MG 12 hr tablet, Take 100 mg by mouth 2 (two) times daily., Disp: , Rfl:  .  cetirizine (ZYRTEC) 10 MG tablet, Take 10 mg by mouth daily., Disp: , Rfl:  .  cyclobenzaprine (FLEXERIL) 10 MG tablet, Take 1 tablet (10 mg total) by mouth 3 (three) times daily as needed for muscle spasms., Disp: 90 tablet, Rfl: 0 .  DULERA 200-5 MCG/ACT AERO, TAKE 2 PUFFS BY MOUTH TWICE A DAY, Disp: 1 Inhaler,  Rfl: 1 .  HYDROcodone-acetaminophen (NORCO) 5-325  MG tablet, Take 1-2 tablets by mouth every 6 (six) hours as needed for moderate pain., Disp: 120 tablet, Rfl: 0 .  isosorbide mononitrate (IMDUR) 30 MG 24 hr tablet, Take 1 tablet (30 mg total) by mouth daily., Disp: 30 tablet, Rfl: 1 .  Melatonin 3 MG TABS, Take 3 mg by mouth at bedtime., Disp: , Rfl:  .  nitroGLYCERIN (NITROSTAT) 0.4 MG SL tablet, Place 1 tablet (0.4 mg total) under the tongue every 5 (five) minutes x 3 doses as needed for chest pain., Disp: 30 tablet, Rfl: 0 .  omeprazole (PRILOSEC) 40 MG capsule, Take 40 mg by mouth daily., Disp: , Rfl:  .  prazosin (MINIPRESS) 1 MG capsule, Take 1 capsule (1 mg total) by mouth at bedtime., Disp: 30 capsule, Rfl: 1 .  ranolazine (RANEXA) 500 MG 12 hr tablet, Take 500 mg by mouth 2 (two) times daily., Disp: , Rfl:  .  rOPINIRole (REQUIP) 0.5 MG tablet, TAKE 1 TO 2 TABLETS BY MOUTH TWICE DAILY AS NEEDED FOR RESTLESS LEGS., Disp: , Rfl: 1 .  tamsulosin (FLOMAX) 0.4 MG CAPS capsule, Take 1 capsule (0.4 mg total) by mouth daily., Disp: 30 capsule, Rfl: 1 .  ticagrelor (BRILINTA) 90 MG TABS tablet, Take 1 tablet (90 mg total) by mouth 2 (two) times daily., Disp: 60 tablet, Rfl: 0  ROS  Constitutional: Denies any fever or chills Gastrointestinal: No reported hemesis, hematochezia, vomiting, or acute GI distress Musculoskeletal: Denies any acute onset joint swelling, redness, loss of ROM, or weakness Neurological: No reported episodes of acute onset apraxia, aphasia, dysarthria, agnosia, amnesia, paralysis, loss of coordination, or loss of consciousness  Allergies  Keith Gibbs is allergic to latuda [lurasidone hcl]; naproxen; and saphris [asenapine].  [place]  Drug: Keith Gibbs  reports that he does not use drugs. Alcohol:  reports that he drinks alcohol. Tobacco:  reports that he quit smoking about 3 weeks ago. His smoking use included cigarettes. He smoked 0.50 packs per day. he has never used  smokeless tobacco. Medical:  has a past medical history of Anxiety, Arthritis, Bursitis, COPD (chronic obstructive pulmonary disease) ([place]), Depression, GERD (gastroesophageal reflux disease), Hepatitis C (2012), Hypertension, MI (myocardial infarction) ([place]), and Stroke ([place]). Surgical: Keith Gibbs  has a past surgical history that includes Appendectomy; Shoulder surgery (Right, 04/09/2012); Back surgery; Abdominal surgery; Colonoscopy; Cardiac catheterization (Left, 06/10/2016); Cardiac catheterization (N/A, 06/10/2016); Esophagogastroduodenoscopy (egd) with propofol (N/A, 01/05/2017); Colonoscopy with propofol (N/A, 01/05/2017); Coronary angioplasty with stent; and Knee arthroscopy with medial menisectomy (Right, 09/05/2017). Family: family history includes Early death in his father; Heart disease in his father and mother; Heart failure in his father; Hypertension in his father, mother, and sister; Osteoarthritis in his mother.  Constitutional Exam  General appearance: Well nourished, well developed, and well hydrated. In no apparent acute distress Vitals:   09/18/17 0808  BP: 124/69  Pulse: (!) 57  Resp: 18  Temp: 97.9 F (36.6 C)  TempSrc: Oral  SpO2: 100%  Weight: 220 lb (99.8 kg)  Height: _0  (1.905 m)   BMI Assessment: Estimated body mass index is 27.5 kg/m as calculated from the following:   Height as of this encounter: _1  (1.905 m).   Weight as of this encounter: 220 lb (99.8 kg).  BMI interpretation table: BMI level Category Range association with higher incidence of chronic pain  <18 kg/m2 Underweight   18.5-24.9 kg/m2 Ideal body weight   25-29.9 kg/m2 Overweight Increased incidence by 20%  30-34.9 kg/m2 Obese (Class I) Increased  incidence by 68%  35-39.9 kg/m2 Severe obesity (Class II) Increased incidence by 136%  >40 kg/m2 Extreme obesity (Class III) Increased incidence by 254%   BMI Readings from Last 4 Encounters:  09/18/17 27.50 kg/m  09/11/17 26.75 kg/m   09/05/17 26.50 kg/m  08/30/17 27.50 kg/m   Wt Readings from Last 4 Encounters:  09/18/17 220 lb (99.8 kg)  09/11/17 214 lb (97.1 kg)  09/05/17 212 lb (96.2 kg)  08/30/17 220 lb (99.8 kg)  Psych/Mental status: Alert, oriented x 3 (person, place, & time)       Eyes: PERLA Respiratory: No evidence of acute respiratory distress  Cervical Spine Area Exam  Skin & Axial Inspection: No masses, redness, edema, swelling, or associated skin lesions Alignment: Symmetrical Functional ROM: Unrestricted ROM      Stability: No instability detected Muscle Tone/Strength: Functionally intact. No obvious neuro-muscular anomalies detected. Sensory (Neurological): Unimpaired Palpation: No palpable anomalies              Upper Extremity (UE) Exam    Side: Right upper extremity  Side: Left upper extremity  Skin & Extremity Inspection: Skin color, temperature, and hair growth are WNL. No peripheral edema or cyanosis. No masses, redness, swelling, asymmetry, or associated skin lesions. No contractures.  Skin & Extremity Inspection: Skin color, temperature, and hair growth are WNL. No peripheral edema or cyanosis. No masses, redness, swelling, asymmetry, or associated skin lesions. No contractures.  Functional ROM: Unrestricted ROM          Functional ROM: Unrestricted ROM          Muscle Tone/Strength: Functionally intact. No obvious neuro-muscular anomalies detected.  Muscle Tone/Strength: Functionally intact. No obvious neuro-muscular anomalies detected.  Sensory (Neurological): Unimpaired          Sensory (Neurological): Unimpaired          Palpation: No palpable anomalies              Palpation: No palpable anomalies              Specialized Test(s): Deferred         Specialized Test(s): Deferred          Thoracic Spine Area Exam  Skin & Axial Inspection: No masses, redness, or swelling Alignment: Symmetrical Functional ROM: Unrestricted ROM Stability: No instability detected Muscle Tone/Strength:  Functionally intact. No obvious neuro-muscular anomalies detected. Sensory (Neurological): Unimpaired Muscle strength & Tone: No palpable anomalies  Lumbar Spine Area Exam  Skin & Axial Inspection: No masses, redness, or swelling Alignment: Symmetrical Functional ROM: Unrestricted ROM      Stability: No instability detected Muscle Tone/Strength: Functionally intact. No obvious neuro-muscular anomalies detected. Sensory (Neurological): Unimpaired Palpation: No palpable anomalies       Provocative Tests: Lumbar Hyperextension and rotation test: evaluation deferred today       Lumbar Lateral bending test: evaluation deferred today       Patrick's Maneuver: evaluation deferred today                    Gait & Posture Assessment  Ambulation: Patient ambulates using a cane Gait: Antalgic Posture: WNL   Lower Extremity Exam    Side: Right lower extremity  Side: Left lower extremity  Skin & Extremity Inspection: Evidence of prior arthroplastic surgery.  In fact, the patient still has stitches from the surgery done by Dr. Rudene Christians.  Skin & Extremity Inspection: Skin color, temperature, and hair growth are WNL. No peripheral edema or cyanosis. No masses, redness,  swelling, asymmetry, or associated skin lesions. No contractures.  Functional ROM: Decreased ROM for knee joint  Functional ROM: Decreased ROM for knee joint  Muscle Tone/Strength: Functionally intact. No obvious neuro-muscular anomalies detected.  Muscle Tone/Strength: Functionally intact. No obvious neuro-muscular anomalies detected.  Sensory (Neurological): Movement-associated discomfort  Sensory (Neurological): Movement-associated discomfort  Palpation: No palpable anomalies  Palpation: No palpable anomalies   Assessment & Plan  Primary Diagnosis & Pertinent Problem List: The primary encounter diagnosis was Chronic pain of both knees (Primary Area of Pain)  (Bilateral) (R>L). 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  Visit Diagnosis: 1. Chronic pain of both knees (Primary Area of Pain)  (Bilateral) (R>L)   2. 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 Chronic bilateral low back pain without sciatica (Secondary Area of Pain)   4. Chronic pain of both hips [facility] Area of Pain) (Bilateral) (R>L)   5. 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 Chronic right shoulder pain (Fourth Area of Pain)   7. Chronic neck pain   8. Spondylosis of cervical region without myelopathy or radiculopathy   9. Chronic pain syndrome   10. 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)  Chronic neck pain (Fifth Area of Pain) (Midline)   Time Note: Greater than 50% of the 40 minute(s) of face-to-face time spent with Keith Gibbs, was spent in counseling/coordination of care regarding: the appropriate use of the pain scale, Keith Gibbs's primary cause of pain, the results of his recent test(s), the significance of each one oth the test(s) anomalies and it's corresponding characteristic pain pattern(s), the treatment plan, treatment alternatives, the risks and possible complications of proposed treatment, medication side effects, realistic expectations, the goals of pain management (increased in functionality) and the need to collect and read the AVS material.  Plan of Care  Pharmacotherapy (Medications Ordered): Meds ordered this encounter  Medications  . cyclobenzaprine (FLEXERIL) 10 MG tablet    Sig: Take 1 tablet (10 mg total) by  mouth 3 (three) times daily as needed for muscle spasms.    Dispense:  90 tablet    Refill:  0    Do not place medication on "Automatic Refill". Fill one day early if pharmacy is closed on scheduled refill date.  Marland Kitchen HYDROcodone-acetaminophen (NORCO) 5-325 MG tablet    Sig: Take 1-2 tablets by mouth every 6 (six) hours as needed for moderate pain.    Dispense:  120 tablet    Refill:  0    Fill one day early if pharmacy is closed on scheduled refill date. Do not fill until: 09/18/17 To last until: 10/18/17   Procedure Orders    No procedure(s) ordered today   Lab Orders  No laboratory test(s) ordered today   Imaging Orders  No imaging studies ordered today   Referral Orders  No referral(s) requested today    Pharmacological management options:  Opioid Analgesics: We'll take over management today. See above orders Membrane stabilizer: We have discussed the possibility of optimizing this mode of therapy, if tolerated Muscle relaxant: We have discussed the possibility  of a trial NSAID: We have discussed the possibility of a trial Other analgesic(s): To be determined at a later time   Interventional management options: Planned, scheduled, and/or pending:    Diagnostic left intra-articular knee injection with local anesthetic and steroid    Considering:   Diagnostic left intra-articular knee injection Diagnostic bilateral genicular nerve block Possible bilateral genicular nerve RFA Diagnostic lumbar facet nerve block Possible bilateral lumbar facet RFA Diagnostic bilateral hip injections Diagnostic right suprascapular nerve block Possible right suprascapular nerve RFA Diagnostic bilateral cervical facet nerve block Possible bilateral cervical RFA    PRN Procedures:   None at this time   Provider-requested follow-up: Return for Procedure (no sedation): (L) IA Knee inj. (Steroid).  Future Appointments  Date Time Provider [place]  10/03/2017 12:30 PM Milinda Pointer, MD [facility] None  10/09/2017 10:45 AM Milinda Pointer, MD [facility] None    Primary Care Physician: Jodi Marble, MD Location: [facility] Note by: Gaspar Cola, MD Date: 09/18/2017; Time: 9:25 AM

## 2017-09-18 NOTE — Telephone Encounter (Signed)
PA request for Hydrocodone sent. Patient notified.

## 2017-09-19 ENCOUNTER — Ambulatory Visit
Admission: RE | Admit: 2017-09-19 | Discharge: 2017-09-19 | Disposition: A | Discharge status: Home / Self Care | Payer: Medicaid Other | Source: Ambulatory Visit | Attending: Nurse Practitioner | Admitting: Nurse Practitioner

## 2017-09-19 ENCOUNTER — Ambulatory Visit
Admission: RE | Admit: 2017-09-19 | Discharge: 2017-09-19 | Disposition: A | Discharge status: Home / Self Care | Payer: Medicaid Other | Source: Ambulatory Visit | Attending: Pain Medicine | Admitting: Pain Medicine

## 2017-09-19 DIAGNOSIS — M25511 Pain in right shoulder: Secondary | ICD-10-CM | POA: Insufficient documentation

## 2017-09-19 DIAGNOSIS — M19011 Primary osteoarthritis, right shoulder: Secondary | ICD-10-CM | POA: Diagnosis not present

## 2017-09-19 DIAGNOSIS — M25561 Pain in right knee: Secondary | ICD-10-CM | POA: Insufficient documentation

## 2017-09-19 DIAGNOSIS — M160 Bilateral primary osteoarthritis of hip: Secondary | ICD-10-CM | POA: Insufficient documentation

## 2017-09-19 DIAGNOSIS — G8929 Other chronic pain: Secondary | ICD-10-CM | POA: Insufficient documentation

## 2017-09-19 DIAGNOSIS — M47812 Spondylosis without myelopathy or radiculopathy, cervical region: Secondary | ICD-10-CM | POA: Diagnosis not present

## 2017-09-19 DIAGNOSIS — M25562 Pain in left knee: Secondary | ICD-10-CM | POA: Insufficient documentation

## 2017-09-19 DIAGNOSIS — M542 Cervicalgia: Secondary | ICD-10-CM | POA: Diagnosis present

## 2017-09-19 DIAGNOSIS — M170 Bilateral primary osteoarthritis of knee: Secondary | ICD-10-CM | POA: Insufficient documentation

## 2017-09-20 ENCOUNTER — Telehealth: Payer: Self-pay | Admitting: Pain Medicine

## 2017-09-20 NOTE — Progress Notes (Signed)
Results were reviewed and found to be: abnormal  No acute injury or pathology identified  Review would suggest interventional pain management techniques may be of benefit

## 2017-09-20 NOTE — Telephone Encounter (Signed)
Patient called and instructed that 120 pills MUST last 30 days. Patient to keep his 10-09-2017 med refill appointment.

## 2017-09-20 NOTE — Progress Notes (Signed)
Results were reviewed and found to be: mildly abnormal  No acute injury or pathology identified  Review would suggest interventional pain management techniques may be of benefit 

## 2017-09-20 NOTE — Telephone Encounter (Signed)
Patient states he will not have enough meds to last until 3-18 appt. Please call to discuss

## 2017-09-21 ENCOUNTER — Other Ambulatory Visit: Payer: Self-pay | Admitting: Pain Medicine

## 2017-09-21 DIAGNOSIS — G894 Chronic pain syndrome: Secondary | ICD-10-CM

## 2017-09-21 MED ORDER — HYDROCODONE-ACETAMINOPHEN 5-325 MG PO TABS: 1.0000 | ORAL_TABLET | Freq: Four times a day (QID) | ORAL | 0 refills | Status: DC | PRN

## 2017-10-03 ENCOUNTER — Ambulatory Visit: Payer: Self-pay | Admitting: Pain Medicine

## 2017-10-06 ENCOUNTER — Telehealth: Payer: Self-pay | Admitting: Gastroenterology

## 2017-10-06 NOTE — Telephone Encounter (Signed)
Is this patient due for LFT testing? I'm following up on recalls.

## 2017-10-09 ENCOUNTER — Emergency Department
Admission: EM | Admit: 2017-10-09 | Discharge: 2017-10-09 | Disposition: A | Discharge status: Home / Self Care | Payer: Medicaid Other | Attending: Emergency Medicine | Admitting: Emergency Medicine

## 2017-10-09 ENCOUNTER — Other Ambulatory Visit: Payer: Self-pay

## 2017-10-09 ENCOUNTER — Ambulatory Visit: Payer: Self-pay | Admitting: Pain Medicine

## 2017-10-09 ENCOUNTER — Emergency Department: Payer: Medicaid Other

## 2017-10-09 DIAGNOSIS — Z7982 Long term (current) use of aspirin: Secondary | ICD-10-CM | POA: Diagnosis not present

## 2017-10-09 DIAGNOSIS — B9789 Other viral agents as the cause of diseases classified elsewhere: Secondary | ICD-10-CM | POA: Diagnosis not present

## 2017-10-09 DIAGNOSIS — I10 Essential (primary) hypertension: Secondary | ICD-10-CM | POA: Diagnosis not present

## 2017-10-09 DIAGNOSIS — Z7902 Long term (current) use of antithrombotics/antiplatelets: Secondary | ICD-10-CM | POA: Diagnosis not present

## 2017-10-09 DIAGNOSIS — Z955 Presence of coronary angioplasty implant and graft: Secondary | ICD-10-CM | POA: Diagnosis not present

## 2017-10-09 DIAGNOSIS — J069 Acute upper respiratory infection, unspecified: Secondary | ICD-10-CM | POA: Insufficient documentation

## 2017-10-09 DIAGNOSIS — Z87891 Personal history of nicotine dependence: Secondary | ICD-10-CM | POA: Insufficient documentation

## 2017-10-09 DIAGNOSIS — I2510 Atherosclerotic heart disease of native coronary artery without angina pectoris: Secondary | ICD-10-CM | POA: Insufficient documentation

## 2017-10-09 DIAGNOSIS — J441 Chronic obstructive pulmonary disease with (acute) exacerbation: Secondary | ICD-10-CM | POA: Insufficient documentation

## 2017-10-09 DIAGNOSIS — J209 Acute bronchitis, unspecified: Secondary | ICD-10-CM | POA: Diagnosis not present

## 2017-10-09 DIAGNOSIS — R05 Cough: Secondary | ICD-10-CM | POA: Diagnosis present

## 2017-10-09 DIAGNOSIS — Z79899 Other long term (current) drug therapy: Secondary | ICD-10-CM | POA: Insufficient documentation

## 2017-10-09 MED ORDER — GUAIFENESIN-CODEINE 100-10 MG/5ML PO SOLN: 5.0000 mL | ORAL | 0 refills | Status: DC | PRN

## 2017-10-09 MED ORDER — SULFAMETHOXAZOLE-TRIMETHOPRIM 800-160 MG PO TABS: 1.0000 | ORAL_TABLET | Freq: Two times a day (BID) | ORAL | 0 refills | Status: DC

## 2017-10-09 MED ORDER — PREDNISONE 10 MG PO TABS: ORAL_TABLET | ORAL | 0 refills | Status: DC

## 2017-10-09 NOTE — Discharge Instructions (Signed)
Follow-up with your primary care doctor in 2-3 weeks or sooner if needed.  Continue your regular medication including your inhalers.  Increase fluids.  Tylenol if needed for fever.  Begin taking Bactrim DS twice daily for 10 days, prednisone 30 mg once a day for the next 5 days and guaifenesin with codeine as needed for cough.

## 2017-10-09 NOTE — Progress Notes (Deleted)
Patient's Name: Keith Gibbs  MRN: [id number]  Referring Provider: Jodi Marble, MD  DOB: [birth year]  PCP: Jodi Marble, MD  DOS: 10/09/2017  Note by: Gaspar Cola, MD  Service setting: Ambulatory outpatient  Specialty: Interventional Pain Management  Location: [facility] (AMB) Pain Management Facility    Patient type: Established   Primary Reason(s) for Visit: Encounter for prescription drug management. (Level of risk: moderate)  CC: No chief complaint on file.  HPI  Keith Gibbs is a 57 y.o. year old, male patient, who comes today for a medication management evaluation. He has Schizophrenia ([place]); Osteoarthritis; COPD (chronic obstructive pulmonary disease) ([place]); Chronic hepatitis C ([place]); GERD (gastroesophageal reflux disease); Swollen testicle; Abnormal ejaculation; Chest pain, unspecified; Bipolar 1 disorder ([place]); Cocaine abuse in remission ([place]); Complete rupture of rotator cuff; Chest pain; Angina at rest [facility]); Severe recurrent major depression without psychotic features ([place]); Coronary artery disease; Alcohol use disorder, moderate, dependence ([place]); Cocaine use disorder, moderate, dependence ([place]); Cannabis use disorder, moderate, dependence ([place]); Overdose of medication, intentional self-harm, sequela ([place]); Tobacco use disorder; Chronic knee pain (Primary Area of Pain)  (Bilateral) (R>L); Chronic low back pain (Secondary Area of Pain) (Bilateral); Chronic hip pain (Tertiary Area of Pain) (Bilateral) (R>L); Chronic shoulder pain (Fourth Area of Pain) (Right); Spondylosis of cervical region without myelopathy or radiculopathy; Chronic pain syndrome; Long term current use of opiate analgesic; Opiate use; Pharmacologic therapy; Disorder of skeletal system; Problems influencing health status; 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); Chronic neck pain (Fifth Area of Pain) (Midline); Chronic musculoskeletal pain; and 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 His  primarily concern today is the No chief complaint on file.  Pain Assessment: Location:     Radiating:   Onset:   Duration:   Quality:   Severity:  /10 (self-reported pain score)  Note: Reported level is compatible with observation.                         When using our objective Pain Scale, levels between 6 and 10/10 are said to belong in an emergency room, as it progressively worsens from a 6/10, described as severely limiting, requiring emergency care not usually available at an outpatient pain management facility. At a 6/10 level, communication becomes difficult and requires great effort. Assistance to reach the emergency department may be required. Facial flushing and profuse sweating along with potentially dangerous increases in heart rate and blood pressure will be evident. Effect on ADL:   Timing:   Modifying factors:    Keith Gibbs was last scheduled for an appointment on 09/20/2017 for medication management. During today's appointment we reviewed Keith Gibbs's chronic pain status, as well as his outpatient medication regimen.  The patient  reports that he does not use drugs. His body mass index is unknown because there is no height or weight on file.  Further details on both, my assessment(s), as well as the proposed treatment plan, please see below.  Controlled Substance Pharmacotherapy Assessment REMS (Risk Evaluation and Mitigation Strategy)  Analgesic: Hydrocodone/acetaminophen 5/325 mg 1 tablet in the 4 hours(fill date 09/05/2017) hydrocodone 30 mg per day Highest recorded MME/day:31m/day MME/day:678mday No notes on file Pharmacokinetics: Liberation and absorption (onset of action): WNL Distribution (time to peak effect): WNL Metabolism and excretion (duration of action): WNL         Pharmacodynamics: Desired effects: Analgesia: Mr. AyJoswickeports >50% benefit. Functional ability: Patient reports that medication allows him to accomplish basic  ADLs Clinically  meaningful improvement in function (CMIF): Sustained CMIF goals met Perceived effectiveness: Described as relatively effective, allowing for increase in activities of daily living (ADL) Undesirable effects: Side-effects or Adverse reactions: None reported Monitoring: [place] PMP: Online review of the past 64-monthperiod conducted. Compliant with practice rules and regulations Last UDS on record: Summary  Date Value Ref Range Status  09/11/2017 FINAL  Final    Comment:    ==================================================================== TOXASSURE COMP DRUG ANALYSIS,UR ==================================================================== Test                             Result       Flag       Units Drug Present and Declared for Prescription Verification   Hydrocodone                    1255         EXPECTED   ng/mg creat   Dihydrocodeine                 168          EXPECTED   ng/mg creat   Norhydrocodone                 820          EXPECTED   ng/mg creat    Sources of hydrocodone include scheduled prescription    medications. Dihydrocodeine and norhydrocodone are expected    metabolites of hydrocodone. Dihydrocodeine is also available as a    scheduled prescription medication.   Desmethylcyclobenzaprine       PRESENT      EXPECTED    Desmethylcyclobenzaprine is an expected metabolite of    cyclobenzaprine.   Bupropion                      PRESENT      EXPECTED   Hydroxybupropion               PRESENT      EXPECTED    Hydroxybupropion is an expected metabolite of bupropion.   Acetaminophen                  PRESENT      EXPECTED Drug Present not Declared for Prescription Verification   Guaifenesin                    PRESENT      UNEXPECTED    Guaifenesin may be administered as an over-the-counter or    prescription drug; it may also be present as a breakdown product    of methocarbamol. Drug Absent but Declared for Prescription Verification   Salicylate                     Not  Detected UNEXPECTED    Aspirin, as indicated in the declared medication list, is not    always detected even when used as directed. ==================================================================== Test                      Result    Flag   Units      Ref Range   Creatinine              74               mg/dL      >=20 ==================================================================== Declared Medications:  The flagging and interpretation  on this report are based on the  following declared medications.  Unexpected results may arise from  inaccuracies in the declared medications.  **Note: The testing scope of this panel includes these medications:  Bupropion  Cyclobenzaprine  Hydrocodone (Hydrocodone-Acetaminophen)  **Note: The testing scope of this panel does not include small to  moderate amounts of these reported medications:  Acetaminophen  Acetaminophen (Hydrocodone-Acetaminophen)  Aspirin (Aspirin 81)  **Note: The testing scope of this panel does not include following  reported medications:  Albuterol  Atorvastatin  Cetirizine  Isosorbide Mononitrate  Melatonin  Mometasone  Multivitamin  Nitroglycerin  Omeprazole  Prazosin (Minipress)  Ranolazine (Ranexa)  Ropinirole (Requip)  Tamsulosin (Flomax)  Ticagrelor (Brilinta) ==================================================================== For clinical consultation, please call [phone]. ====================================================================    UDS interpretation: Compliant          Medication Assessment Form: Reviewed. Patient indicates being compliant with therapy Treatment compliance: Compliant Risk Assessment Profile: Aberrant behavior: See prior evaluations. None observed or detected today Comorbid factors increasing risk of overdose: See prior notes. No additional risks detected today Risk of substance use disorder (SUD): Low Opioid Risk Tool - 09/18/17 0816      Family History of  Substance Abuse   Alcohol  Positive Male    Illegal Drugs  Positive Male    Rx Drugs  Negative      Personal History of Substance Abuse   Alcohol  Negative    Illegal Drugs  Positive Male or Male    Rx Drugs  Negative      Age   Age between 85-45 years   No      History of Preadolescent Sexual Abuse   History of Preadolescent Sexual Abuse  Negative or Male      Psychological Disease   Psychological Disease  Positive anxiety   anxiety   Depression  Positive      Total Score   Opioid Risk Tool Scoring  13    Opioid Risk Interpretation  High Risk      ORT Scoring interpretation table:  Score <3 = Low Risk for SUD  Score between 4-7 = Moderate Risk for SUD  Score >8 = High Risk for Opioid Abuse   Risk Mitigation Strategies:  Patient Counseling: Covered Patient-Prescriber Agreement (PPA): Present and active  Notification to other healthcare providers: Done  Pharmacologic Plan: No change in therapy, at this time.             Laboratory Chemistry  Inflammation Markers (CRP: Acute Phase) (ESR: Chronic Phase) Lab Results  Component Value Date   CRP 24.6 (H) 09/11/2017   ESRSEDRATE 16 09/11/2017   LATICACIDVEN 1.2 02/20/2017                         Rheumatology Markers Lab Results  Component Value Date   ANA Negative 11/01/2016                Renal Function Markers Lab Results  Component Value Date   BUN 11 09/11/2017   CREATININE 0.98 09/11/2017   GFRAA 99 09/11/2017   GFRNONAA 85 09/11/2017                 Hepatic Function Markers Lab Results  Component Value Date   AST 15 09/11/2017   ALT 31 02/20/2017   ALBUMIN 4.2 09/11/2017   ALKPHOS 71 09/11/2017   HCVAB >11.0 (H) 11/01/2016   LIPASE 45 02/20/2017  Electrolytes Lab Results  Component Value Date   NA 139 09/11/2017   K 4.7 09/11/2017   CL 99 09/11/2017   CALCIUM 9.0 09/11/2017   MG 2.1 09/11/2017                        Neuropathy Markers Lab Results  Component Value  Date   VITAMINB12 457 09/11/2017   HGBA1C 5.2 12/28/2016                 Bone Pathology Markers Lab Results  Component Value Date   25OHVITD1 35 09/11/2017   25OHVITD2 <1.0 09/11/2017   25OHVITD3 35 09/11/2017                         Coagulation Parameters Lab Results  Component Value Date   INR 1.02 11/01/2016   LABPROT 13.4 11/01/2016   PLT 249 08/30/2017                 Cardiovascular Markers Lab Results  Component Value Date   BNP 12.0 12/26/2016   TROPONINI <0.03 02/20/2017   HGB 15.4 08/30/2017   HCT 44.9 08/30/2017                 CA Markers No results found for: CEA, CA125, LABCA2               Note: Lab results reviewed.  Recent Diagnostic Imaging Results  DG Cervical Spine Complete CLINICAL DATA:  57 year old male with chronic neck pain for 10 years. No reported trauma. Initial encounter.  EXAM: CERVICAL SPINE - COMPLETE 4+ VIEW  COMPARISON:  None.  FINDINGS: Normal alignment.  Mild to moderate C5-6 and C6-7 cervical spondylotic changes.  Moderate right and minimal left C5-6 foraminal narrowing.  Mild bilateral C6-7 foraminal narrowing.  No fracture or abnormal prevertebral soft tissue swelling.  No lung apical lesion  IMPRESSION: Mild to moderate C5-6 and C6-7 cervical spondylotic changes.  Moderate right and minimal left C5-6 foraminal narrowing.  Mild bilateral C6-7 foraminal narrowing.  Electronically Signed   By: Genia Del M.D.   On: 09/19/2017 15:50    DG Knee 1-2 Views Left CLINICAL DATA:  57 year old male with chronic knee pain. Initial encounter.  EXAM: LEFT KNEE - 1-2 VIEW  COMPARISON:  None.  FINDINGS: Minimal spurring posterosuperior patella may represent minimal patellofemoral joint degenerative changes.  No significant tibiofemoral joint space narrowing.  Questionable tiny suprapatellar joint effusion.  No fracture or dislocation.  IMPRESSION: Minimal patellofemoral joint degenerative  changes.  Electronically Signed   By: Genia Del M.D.   On: 09/19/2017 15:45     DG Shoulder Right CLINICAL DATA:  57 year old male with chronic right shoulder pain. Initial encounter.  EXAM: RIGHT SHOULDER - 2+ VIEW  COMPARISON:  06/05/2014.  FINDINGS: Acromioclavicular joint degenerative changes with bony overgrowth superior margin.  No abnormal soft tissue calcifications.  No fracture or dislocation.  Visualized lungs clear.  Remote right third rib fracture.  IMPRESSION: Acromioclavicular joint degenerative changes with bony overgrowth superior margin.  Electronically Signed   By: Genia Del M.D.   On: 09/19/2017 14:44  Complexity Note: Imaging results reviewed. Results shared with Keith Gibbs, using Layman's terms.                         Meds   Current Outpatient Medications:  .  acetaminophen (TYLENOL) 500 MG tablet, Take 2 tablets (1,000 mg  total) by mouth every 6 (six) hours as needed. For pain., Disp: 90 tablet, Rfl: 1 .  albuterol (PROAIR HFA) 108 (90 Base) MCG/ACT inhaler, Inhale 2 puffs into the lungs every 6 (six) hours as needed for wheezing or shortness of breath., Disp: 1 Inhaler, Rfl: 1 .  aspirin EC 81 MG tablet, Take 1 tablet (81 mg total) by mouth daily., Disp: 30 tablet, Rfl: 1 .  atorvastatin (LIPITOR) 40 MG tablet, Take 1 tablet (40 mg total) by mouth daily at 6 PM., Disp: 30 tablet, Rfl: 1 .  buPROPion (WELLBUTRIN SR) 100 MG 12 hr tablet, Take 100 mg by mouth 2 (two) times daily., Disp: , Rfl:  .  cetirizine (ZYRTEC) 10 MG tablet, Take 10 mg by mouth daily., Disp: , Rfl:  .  cyclobenzaprine (FLEXERIL) 10 MG tablet, Take 1 tablet (10 mg total) by mouth 3 (three) times daily as needed for muscle spasms., Disp: 90 tablet, Rfl: 0 .  DULERA 200-5 MCG/ACT AERO, TAKE 2 PUFFS BY MOUTH TWICE A DAY, Disp: 1 Inhaler, Rfl: 1 .  HYDROcodone-acetaminophen (NORCO) 5-325 MG tablet, Take 1 tablet by mouth every 6 (six) hours as needed for severe pain.  Max.: 4/day. Must Last 30 days., Disp: 120 tablet, Rfl: 0 .  isosorbide mononitrate (IMDUR) 30 MG 24 hr tablet, Take 1 tablet (30 mg total) by mouth daily., Disp: 30 tablet, Rfl: 1 .  Melatonin 3 MG TABS, Take 3 mg by mouth at bedtime., Disp: , Rfl:  .  nitroGLYCERIN (NITROSTAT) 0.4 MG SL tablet, Place 1 tablet (0.4 mg total) under the tongue every 5 (five) minutes x 3 doses as needed for chest pain., Disp: 30 tablet, Rfl: 0 .  omeprazole (PRILOSEC) 40 MG capsule, Take 40 mg by mouth daily., Disp: , Rfl:  .  prazosin (MINIPRESS) 1 MG capsule, Take 1 capsule (1 mg total) by mouth at bedtime., Disp: 30 capsule, Rfl: 1 .  ranolazine (RANEXA) 500 MG 12 hr tablet, Take 500 mg by mouth 2 (two) times daily., Disp: , Rfl:  .  rOPINIRole (REQUIP) 0.5 MG tablet, TAKE 1 TO 2 TABLETS BY MOUTH TWICE DAILY AS NEEDED FOR RESTLESS LEGS., Disp: , Rfl: 1 .  tamsulosin (FLOMAX) 0.4 MG CAPS capsule, Take 1 capsule (0.4 mg total) by mouth daily., Disp: 30 capsule, Rfl: 1 .  ticagrelor (BRILINTA) 90 MG TABS tablet, Take 1 tablet (90 mg total) by mouth 2 (two) times daily., Disp: 60 tablet, Rfl: 0  ROS  Constitutional: Denies any fever or chills Gastrointestinal: No reported hemesis, hematochezia, vomiting, or acute GI distress Musculoskeletal: Denies any acute onset joint swelling, redness, loss of ROM, or weakness Neurological: No reported episodes of acute onset apraxia, aphasia, dysarthria, agnosia, amnesia, paralysis, loss of coordination, or loss of consciousness  Allergies  Keith Gibbs is allergic to latuda [lurasidone hcl]; naproxen; and saphris [asenapine].  [place]  Drug: Keith Gibbs  reports that he does not use drugs. Alcohol:  reports that he drinks alcohol. Tobacco:  reports that he quit smoking about 6 weeks ago. His smoking use included cigarettes. He smoked 0.50 packs per day. he has never used smokeless tobacco. Medical:  has a past medical history of Anxiety, Arthritis, Bursitis, COPD (chronic  obstructive pulmonary disease) ([place]), Depression, GERD (gastroesophageal reflux disease), Hepatitis C (2012), Hypertension, MI (myocardial infarction) ([place]), and Stroke ([place]). Surgical: Keith Gibbs  has a past surgical history that includes Appendectomy; Shoulder surgery (Right, 04/09/2012); Back surgery; Abdominal surgery; Colonoscopy; Cardiac catheterization (Left, 06/10/2016); Cardiac catheterization (  N/A, 06/10/2016); Esophagogastroduodenoscopy (egd) with propofol (N/A, 01/05/2017); Colonoscopy with propofol (N/A, 01/05/2017); Coronary angioplasty with stent; and Knee arthroscopy with medial menisectomy (Right, 09/05/2017). Family: family history includes Early death in his father; Heart disease in his father and mother; Heart failure in his father; Hypertension in his father, mother, and sister; Osteoarthritis in his mother.  Constitutional Exam  General appearance: Well nourished, well developed, and well hydrated. In no apparent acute distress There were no vitals filed for this visit. BMI Assessment: Estimated body mass index is 27.5 kg/m as calculated from the following:   Height as of 09/18/17: _0  (1.905 m).   Weight as of 09/18/17: 220 lb (99.8 kg).  BMI interpretation table: BMI level Category Range association with higher incidence of chronic pain  <18 kg/m2 Underweight   18.5-24.9 kg/m2 Ideal body weight   25-29.9 kg/m2 Overweight Increased incidence by 20%  30-34.9 kg/m2 Obese (Class I) Increased incidence by 68%  35-39.9 kg/m2 Severe obesity (Class II) Increased incidence by 136%  >40 kg/m2 Extreme obesity (Class III) Increased incidence by 254%   BMI Readings from Last 4 Encounters:  09/18/17 27.50 kg/m  09/11/17 26.75 kg/m  09/05/17 26.50 kg/m  08/30/17 27.50 kg/m   Wt Readings from Last 4 Encounters:  09/18/17 220 lb (99.8 kg)  09/11/17 214 lb (97.1 kg)  09/05/17 212 lb (96.2 kg)  08/30/17 220 lb (99.8 kg)  Psych/Mental status: Alert, oriented x 3 (person, place, &  time)       Eyes: PERLA Respiratory: No evidence of acute respiratory distress  Cervical Spine Area Exam  Skin & Axial Inspection: No masses, redness, edema, swelling, or associated skin lesions Alignment: Symmetrical Functional ROM: Unrestricted ROM      Stability: No instability detected Muscle Tone/Strength: Functionally intact. No obvious neuro-muscular anomalies detected. Sensory (Neurological): Unimpaired Palpation: No palpable anomalies              Upper Extremity (UE) Exam    Side: Right upper extremity  Side: Left upper extremity  Skin & Extremity Inspection: Skin color, temperature, and hair growth are WNL. No peripheral edema or cyanosis. No masses, redness, swelling, asymmetry, or associated skin lesions. No contractures.  Skin & Extremity Inspection: Skin color, temperature, and hair growth are WNL. No peripheral edema or cyanosis. No masses, redness, swelling, asymmetry, or associated skin lesions. No contractures.  Functional ROM: Unrestricted ROM          Functional ROM: Unrestricted ROM          Muscle Tone/Strength: Functionally intact. No obvious neuro-muscular anomalies detected.  Muscle Tone/Strength: Functionally intact. No obvious neuro-muscular anomalies detected.  Sensory (Neurological): Unimpaired          Sensory (Neurological): Unimpaired          Palpation: No palpable anomalies              Palpation: No palpable anomalies              Specialized Test(s): Deferred         Specialized Test(s): Deferred          Thoracic Spine Area Exam  Skin & Axial Inspection: No masses, redness, or swelling Alignment: Symmetrical Functional ROM: Unrestricted ROM Stability: No instability detected Muscle Tone/Strength: Functionally intact. No obvious neuro-muscular anomalies detected. Sensory (Neurological): Unimpaired Muscle strength & Tone: No palpable anomalies  Lumbar Spine Area Exam  Skin & Axial Inspection: No masses, redness, or swelling Alignment:  Symmetrical Functional ROM: Unrestricted ROM  Stability: No instability detected Muscle Tone/Strength: Functionally intact. No obvious neuro-muscular anomalies detected. Sensory (Neurological): Unimpaired Palpation: No palpable anomalies       Provocative Tests: Lumbar Hyperextension and rotation test: evaluation deferred today       Lumbar Lateral bending test: evaluation deferred today       Patrick's Maneuver: evaluation deferred today                    Gait & Posture Assessment  Ambulation: Unassisted Gait: Relatively normal for age and body habitus Posture: WNL   Lower Extremity Exam    Side: Right lower extremity  Side: Left lower extremity  Skin & Extremity Inspection: Skin color, temperature, and hair growth are WNL. No peripheral edema or cyanosis. No masses, redness, swelling, asymmetry, or associated skin lesions. No contractures.  Skin & Extremity Inspection: Skin color, temperature, and hair growth are WNL. No peripheral edema or cyanosis. No masses, redness, swelling, asymmetry, or associated skin lesions. No contractures.  Functional ROM: Unrestricted ROM          Functional ROM: Unrestricted ROM          Muscle Tone/Strength: Functionally intact. No obvious neuro-muscular anomalies detected.  Muscle Tone/Strength: Functionally intact. No obvious neuro-muscular anomalies detected.  Sensory (Neurological): Unimpaired  Sensory (Neurological): Unimpaired  Palpation: No palpable anomalies  Palpation: No palpable anomalies   Assessment  Primary Diagnosis & Pertinent Problem List: The primary encounter diagnosis was Chronic knee pain (Primary Area of Pain)  (Bilateral) (R>L). Diagnoses of Chronic low back pain (Secondary Area of Pain) (Bilateral), Chronic hip pain (Tertiary Area of Pain) (Bilateral) (R>L), Chronic shoulder pain (Fourth Area of Pain) (Right), Chronic neck pain (Fifth Area of Pain) (Midline), 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), Osteoarthritis of knees  (Bilateral) (R>L), Spondylosis of cervical region without myelopathy or radiculopathy, Chronic pain syndrome, Pharmacologic therapy, Opiate use, Cocaine use disorder, moderate, dependence (HCC), Cannabis use disorder, moderate, dependence ([place]), Overdose of medication, intentional self-harm, sequela ([place]), Schizophrenia, unspecified type ([place]), and Chronic musculoskeletal pain were also pertinent to this visit.  Status Diagnosis  Unimproved Unimproved Unimproved 1. Chronic knee pain (Primary Area of Pain)  (Bilateral) (R>L)   2. Chronic low back pain (Secondary Area of Pain) (Bilateral)   3. 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 Chronic shoulder pain (Fourth Area of Pain) (Right)   5. Chronic neck pain (Fifth Area of Pain) (Midline)   6. 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)   7. 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)   8. Spondylosis of cervical region without myelopathy or radiculopathy   9. Chronic pain syndrome   10. Pharmacologic therapy   11. Opiate use   12. Cocaine use disorder, moderate, dependence ([place])   13. Cannabis use disorder, moderate, dependence ([place])   14. Overdose of medication, intentional self-harm, sequela ([place])   15. Schizophrenia, unspecified type ([place])   16. Chronic musculoskeletal pain     Problems updated and reviewed during this visit: Problem  Overdose of Medication, Intentional Self-Harm, Sequela (Hcc)   Plan of Care  Pharmacotherapy (Medications Ordered): No orders of the defined types were placed in this encounter.  Medications administered today: Shayan W. Manzella had no medications administered during this visit.  Procedure Orders    No procedure(s) ordered today   Lab Orders  No laboratory test(s) ordered today   Imaging Orders  No imaging studies ordered today   Referral Orders  No referral(s) requested today    Interventional  management options: Planned, scheduled, and/or pending:   Diagnostic left  intra-articular knee injection with local anesthetic and steroid    Considering:   Diagnostic left intra-articular knee injection Diagnostic bilateral genicular nerve block Possiblebilateral genicular nerve RFA Diagnostic lumbar facet nerve block Possible bilateral lumbar facet RFA Diagnostic bilateral hip injections Diagnosticright suprascapular nerve block Possible right suprascapular nerve RFA Diagnosticbilateral cervical facet nerve block Possible bilateral cervical RFA   Palliative PRN treatment(s):   None at this time   Provider-requested follow-up: No Follow-up on file.  Future Appointments  Date Time Provider [place]  10/09/2017 10:45 AM Milinda Pointer, MD [facility] None  10/12/2017 10:15 AM Milinda Pointer, MD [facility] None   Primary Care Physician: Jodi Marble, MD Location: [facility] Note by: Gaspar Cola, MD Date: 10/09/2017; Time: 6:43 AM

## 2017-10-09 NOTE — ED Provider Notes (Signed)
[facility] Emergency Department Provider Note  ___________________________________________   First MD Initiated Contact with Patient 10/09/17 1053     (approximate)  I have reviewed the triage vital signs and the nursing notes.   HISTORY  Chief Complaint Cough  HPI Keith Gibbs is a 57 y.o. male is here with complaint of cough and congestion for the last 3 days.  Patient states that he is a former smoker and has been diagnosed with COPD.  He continues to use his inhalers as was prescribed by his doctor.  He states he has had a low-grade temp.  He denies any GI or GU symptoms.  He rates his pain as 10/10.   Past Medical History:  Diagnosis Date  . Anxiety   . Arthritis   . Bursitis   . COPD (chronic obstructive pulmonary disease) ([place])   . Depression   . GERD (gastroesophageal reflux disease)   . Hepatitis C 2012   No longer has Hep C  . Hypertension   . MI (myocardial infarction) ([place])   . Stroke [facility])     Patient Active Problem List   Diagnosis Date Noted  . 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) 09/18/2017  . Chronic neck pain (Fifth Area of Pain) ([place]) 09/18/2017  . Chronic musculoskeletal pain 09/18/2017  . Chronic knee pain (Primary Area of Pain)  (Bilateral) (R>L) 09/11/2017  . Chronic low back pain (Secondary Area of Pain) (Bilateral) 09/11/2017  . Chronic hip pain [facility] Area of Pain) (Bilateral) (R>L) 09/11/2017  . Chronic shoulder pain (Fourth Area of Pain) (Right) 09/11/2017  . Spondylosis of cervical region without myelopathy or radiculopathy 09/11/2017  . Chronic pain syndrome 09/11/2017  . Long term current use of opiate analgesic 09/11/2017  . Opiate use 09/11/2017  . Pharmacologic therapy 09/11/2017  . Disorder of skeletal system 09/11/2017  . Problems influencing health status 09/11/2017  . Overdose of medication, intentional self-harm, sequela ([place])  12/28/2016  . Tobacco use disorder 12/28/2016  . Severe recurrent major depression without psychotic features ([place]) 12/27/2016  . Coronary artery disease 12/27/2016  . Alcohol use disorder, moderate, dependence ([place]) 12/27/2016  . Cocaine use disorder, moderate, dependence ([place]) 12/27/2016  . Cannabis use disorder, moderate, dependence ([place]) 12/27/2016  . Angina at rest [facility]) 06/11/2016  . Chest pain 06/10/2016  . Bipolar 1 disorder ([place]) 05/27/2013  . Abnormal ejaculation 11/30/2012  . Chest pain, unspecified 11/30/2012  . Swollen testicle 07/27/2012  . Schizophrenia ([place]) 06/27/2012  . Osteoarthritis 06/27/2012  . COPD (chronic obstructive pulmonary disease) ([place]) 06/27/2012  . Chronic hepatitis C ([place]) 06/27/2012  . GERD (gastroesophageal reflux disease) 06/27/2012  . Complete rupture of rotator cuff 02/10/2012  . Cocaine abuse in remission ([place]) 12/05/2011    Past Surgical History:  Procedure Laterality Date  . ABDOMINAL SURGERY     Intestines  . APPENDECTOMY    . BACK SURGERY    . CARDIAC CATHETERIZATION Left 06/10/2016   Procedure: Left Heart Cath and Coronary Angiography;  Surgeon: Dionisio David, MD;  Location: [place] CV LAB;  Service: Cardiovascular;  Laterality: Left;  . CARDIAC CATHETERIZATION N/A 06/10/2016   Procedure: Coronary Stent Intervention;  Surgeon: Yolonda Kida, MD;  Location: [place] CV LAB;  Service: Cardiovascular;  Laterality: N/A;  . COLONOSCOPY    . COLONOSCOPY WITH PROPOFOL N/A 01/05/2017   Procedure: COLONOSCOPY WITH PROPOFOL;  Surgeon: Jonathon Bellows, MD;  Location: [facility] ENDOSCOPY;  Service: Endoscopy;  Laterality: N/A;  . CORONARY ANGIOPLASTY WITH STENT PLACEMENT    . ESOPHAGOGASTRODUODENOSCOPY (EGD) WITH PROPOFOL N/A 01/05/2017   Procedure: ESOPHAGOGASTRODUODENOSCOPY (EGD) WITH PROPOFOL;  Surgeon: Jonathon Bellows, MD;  Location: [facility] ENDOSCOPY;  Service: Endoscopy;  Laterality: N/A;  . KNEE ARTHROSCOPY WITH MEDIAL MENISECTOMY Right  09/05/2017   Procedure: KNEE ARTHROSCOPY WITH MEDIAL AND LATERAL  MENISECTOMY PARTIAL SYNOVECTOMY;  Surgeon: Hessie Knows, MD;  Location: [facility] ORS;  Service: Orthopedics;  Laterality: Right;  . SHOULDER SURGERY Right 04/09/2012    Prior to Admission medications   Medication Sig Start Date End Date Taking? Authorizing Provider  acetaminophen (TYLENOL) 500 MG tablet Take 2 tablets (1,000 mg total) by mouth every 6 (six) hours as needed. For pain. 12/30/16   Pucilowska, Wardell Honour, MD  albuterol (PROAIR HFA) 108 (90 Base) MCG/ACT inhaler Inhale 2 puffs into the lungs every 6 (six) hours as needed for wheezing or shortness of breath. 12/30/16   Pucilowska, Herma Ard B, MD  aspirin EC 81 MG tablet Take 1 tablet (81 mg total) by mouth daily. 12/30/16   Pucilowska, Herma Ard B, MD  atorvastatin (LIPITOR) 40 MG tablet Take 1 tablet (40 mg total) by mouth daily at 6 PM. 12/30/16   Pucilowska, Jolanta B, MD  buPROPion (WELLBUTRIN SR) 100 MG 12 hr tablet Take 100 mg by mouth 2 (two) times daily.    [provider]  cetirizine (ZYRTEC) 10 MG tablet Take 10 mg by mouth daily.    [provider]  cyclobenzaprine (FLEXERIL) 10 MG tablet Take 1 tablet (10 mg total) by mouth 3 (three) times daily as needed for muscle spasms. 09/18/17 10/18/17  Milinda Pointer, MD  DULERA 200-5 MCG/ACT AERO TAKE 2 PUFFS BY MOUTH TWICE A DAY 12/30/16   Pucilowska, Jolanta B, MD  guaiFENesin-codeine 100-10 MG/5ML syrup Take 5 mLs by mouth every 4 (four) hours as needed. 10/09/17   Johnn Hai, PA-C  HYDROcodone-acetaminophen (NORCO) 5-325 MG tablet Take 1 tablet by mouth every 6 (six) hours as needed for severe pain. Max.: 4/day. Must Last 30 days. 09/18/17 10/18/17  Milinda Pointer, MD  isosorbide mononitrate (IMDUR) 30 MG 24 hr tablet Take 1 tablet (30 mg total) by mouth daily. 12/30/16   Pucilowska, Herma Ard B, MD  Melatonin 3 MG TABS Take 3 mg by mouth at bedtime.    [provider]  nitroGLYCERIN (NITROSTAT) 0.4  MG SL tablet Place 1 tablet (0.4 mg total) under the tongue every 5 (five) minutes x 3 doses as needed for chest pain. 12/30/16   Pucilowska, Herma Ard B, MD  omeprazole (PRILOSEC) 40 MG capsule Take 40 mg by mouth daily.    [provider]  prazosin (MINIPRESS) 1 MG capsule Take 1 capsule (1 mg total) by mouth at bedtime. 12/30/16   Pucilowska, Herma Ard B, MD  predniSONE (DELTASONE) 10 MG tablet Take 3 tablets once a day until finished 10/09/17   Johnn Hai, PA-C  ranolazine (RANEXA) 500 MG 12 hr tablet Take 500 mg by mouth 2 (two) times daily.    [provider]  rOPINIRole (REQUIP) 0.5 MG tablet TAKE 1 TO 2 TABLETS BY MOUTH TWICE DAILY AS NEEDED FOR RESTLESS LEGS. 11/16/16   [provider]  sulfamethoxazole-trimethoprim (BACTRIM DS,SEPTRA DS) 800-160 MG tablet Take 1 tablet by mouth 2 (two) times daily. 10/09/17   Johnn Hai, PA-C  tamsulosin (FLOMAX) 0.4 MG CAPS capsule Take 1 capsule (0.4 mg total) by mouth daily. 12/30/16   Pucilowska, Wardell Honour, MD  ticagrelor ([place]) 90  MG TABS tablet Take 1 tablet (90 mg total) by mouth 2 (two) times daily. 12/30/16   Pucilowska, Wardell Honour, MD    Allergies Anette Guarneri [lurasidone hcl]; Naproxen; and Saphris [asenapine]  Family History  Problem Relation Age of Onset  . Osteoarthritis Mother   . Heart disease Mother   . Hypertension Mother   . Heart failure Father   . Heart disease Father   . Early death Father   . Hypertension Father   . Hypertension Sister   . Prostate cancer Neg Hx   . Bladder Cancer Neg Hx   . Kidney cancer Neg Hx     Social History Social History   Tobacco Use  . Smoking status: Former Smoker    Packs/day: 0.50    Types: Cigarettes    Last attempt to quit: 08/24/2017    Years since quitting: 0.1  . Smokeless tobacco: Never Used  Substance Use Topics  . Alcohol use: Yes    Comment: occas  . Drug use: No    Comment: last use couple months ago    Review of Systems Constitutional:  Subjective fever/chills Eyes: No visual changes. ENT: No sore throat.  Negative for ear pain.  Positive for nasal congestion. Cardiovascular: Denies chest pain. Respiratory: Denies shortness of breath.  Positive for coughing. Gastrointestinal: No abdominal pain.  No nausea, no vomiting.  No diarrhea.   Musculoskeletal: Negative for back pain. Skin: Negative for rash. Neurological: Negative for headaches, focal weakness or numbness. ____________________________________________   PHYSICAL EXAM:  VITAL SIGNS: ED Triage Vitals  Enc Vitals Group     BP 10/09/17 1035 (!) 105/57     Pulse Rate 10/09/17 1035 76     Resp 10/09/17 1035 20     Temp 10/09/17 1035 99.1 F (37.3 C)     Temp Source 10/09/17 1035 Oral     SpO2 10/09/17 1035 98 %     Weight 10/09/17 1036 215 lb (97.5 kg)     Height 10/09/17 1036 6\' 3"  (1.905 m)     Head Circumference --      Peak Flow --      Pain Score 10/09/17 1044 10     Pain Loc --      Pain Edu? --      Excl. in [place]? --    Constitutional: Alert and oriented. Well appearing and in no acute distress. Eyes: Conjunctivae are normal.  Head: Atraumatic. Nose: No congestion/rhinnorhea.  TMs are dull bilaterally. Mouth/Throat: Mucous membranes are moist.  Oropharynx non-erythematous.  Moderate posterior drainage is noted. Neck: No stridor.   Hematological/Lymphatic/Immunilogical: No cervical lymphadenopathy. Cardiovascular: Normal rate, regular rhythm. Grossly normal heart sounds.  Good peripheral circulation. Respiratory: Normal respiratory effort.  No retractions. Lungs bilateral expiratory wheezes with bronchitic cough frequently. Musculoskeletal: Moves upper and lower extremities without any difficulty.  Normal gait was noted. Neurologic:  Normal speech and language. No gross focal neurologic deficits are appreciated.  Skin:  Skin is warm, dry and intact. No rash noted. Psychiatric: Mood and affect are normal. Speech and behavior are  normal.  ____________________________________________   LABS (all labs ordered are listed, but only abnormal results are displayed)  Labs Reviewed - No data to display RADIOLOGY  ED MD interpretation:    Chest x-ray without evidence of pneumonia.  Official radiology report(s): Dg Chest 2 View  Result Date: 10/09/2017 CLINICAL DATA:  Shortness of breath and cough.  Chest pain EXAM: CHEST - 2 VIEW COMPARISON:  December 26, 2016 FINDINGS: There  are areas of scarring and interstitial thickening throughout both lungs. There is no frank edema or consolidation. Heart size and pulmonary vascularity are normal. No adenopathy. No bone lesions. IMPRESSION: Interstitial thickening with scattered areas of scarring throughout the lungs without edema or consolidation. Heart size normal. No adenopathy. With respect to the interstitial thickening in the lungs, this patient potentially could benefit from nonemergent high-resolution chest CT for further assessment. Electronically Signed   By: Lowella Grip III M.D.   On: 10/09/2017 11:30    ____________________________________________   PROCEDURES  Procedure(s) performed: None  Procedures  Critical Care performed: No  ____________________________________________   INITIAL IMPRESSION / ASSESSMENT AND PLAN / ED COURSE  Patient was made aware that his viral respiratory illness has exacerbated his COPD.  Patient was encouraged to continue using his inhalers as directed.  Prednisone 30 mg once a day for the next 5 days, Bactrim DS twice daily for 10 days and guaifenesin with codeine as needed for cough and congestion.  Patient is to follow-up with his PCP and encouraged to make an appointment for follow-up.  It was also suggested that his chest x-ray be followed up as an outpatient with a CT scan.  ____________________________________________   FINAL CLINICAL IMPRESSION(S) / ED DIAGNOSES  Final diagnoses:  Viral URI with cough  Acute bronchitis,  unspecified organism  Chronic obstructive pulmonary disease with acute exacerbation [facility])     ED Discharge Orders        Ordered    predniSONE (DELTASONE) 10 MG tablet     10/09/17 1146    guaiFENesin-codeine 100-10 MG/5ML syrup  Every 4 hours PRN     10/09/17 1146    sulfamethoxazole-trimethoprim (BACTRIM DS,SEPTRA DS) 800-160 MG tablet  2 times daily     10/09/17 1146       Note:  This document was prepared using Dragon voice recognition software and may include unintentional dictation errors.    Johnn Hai, PA-C 10/09/17 1513    Schuyler Amor, MD 10/09/17 [phone]

## 2017-10-09 NOTE — ED Triage Notes (Signed)
Cough and congestion x 3 days

## 2017-10-12 ENCOUNTER — Ambulatory Visit: Payer: Medicaid Other | Admitting: Pain Medicine

## 2017-10-12 NOTE — Progress Notes (Deleted)
Patient's Name: Keith Gibbs  MRN: [id number]  Referring Provider: Jodi Marble, MD  DOB: 03-Oct-1960  PCP: Jodi Marble, MD  DOS: 10/12/2017  Note by: Gaspar Cola, MD  Service setting: Ambulatory outpatient  Specialty: Interventional Pain Management  Patient type: Established  Location: [facility] (AMB) Pain Management Facility  Visit type: Interventional Procedure   Primary Reason for Visit: Interventional Pain Management Treatment. CC: No chief complaint on file.  Procedure:  Anesthesia, Analgesia, Anxiolysis:  Type: Diagnostic Intra-Articular Local anesthetic and steroid Knee Injection #1  Region: Lateral infrapatellar Knee Region Level: Knee Joint Laterality: Left knee  Type: Local Anesthesia Indication(s): Analgesia         Local Anesthetic: Lidocaine 1-2% Route: Infiltration ([place]/IM) IV Access: Declined Sedation: Declined    Indications: 1. 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)   2. Chronic knee pain (Primary Area of Pain)  (Bilateral) (R>L)    Pain Score: Pre-procedure:  /10 Post-procedure:  /10  Pre-op Assessment:  Mr. Pullara is a 57 y.o. (year old), male patient, seen today for interventional treatment. He  has a past surgical history that includes Appendectomy; Shoulder surgery (Right, 04/09/2012); Back surgery; Abdominal surgery; Colonoscopy; Cardiac catheterization (Left, 06/10/2016); Cardiac catheterization (N/A, 06/10/2016); Esophagogastroduodenoscopy (egd) with propofol (N/A, 01/05/2017); Colonoscopy with propofol (N/A, 01/05/2017); Coronary angioplasty with stent; and Knee arthroscopy with medial menisectomy (Right, 09/05/2017). Mr. Grochowski has a current medication list which includes the following prescription(s): acetaminophen, albuterol, aspirin ec, atorvastatin, bupropion, cetirizine, cyclobenzaprine, dulera, guaifenesin-codeine, hydrocodone-acetaminophen, isosorbide mononitrate, melatonin, nitroglycerin, omeprazole, prazosin, prednisone, ranolazine,  ropinirole, sulfamethoxazole-trimethoprim, tamsulosin, and ticagrelor. His primarily concern today is the No chief complaint on file.  Initial Vital Signs:  Pulse Rate:   Temp:   Resp:   BP:   SpO2:    BMI: Estimated body mass index is 26.87 kg/m as calculated from the following:   Height as of 10/09/17: 6\' 3"  (1.905 m).   Weight as of 10/09/17: 215 lb (97.5 kg).  Risk Assessment: Allergies: Reviewed. He is allergic to latuda [lurasidone hcl]; naproxen; and saphris [asenapine].  Allergy Precautions: None required Coagulopathies: Reviewed. None identified.  Blood-thinner therapy: None at this time Active Infection(s): Reviewed. None identified. Mr. Mcloud is afebrile  Site Confirmation: Mr. Fricke was asked to confirm the procedure and laterality before marking the site Procedure checklist: Completed Consent: Before the procedure and under the influence of no sedative(s), amnesic(s), or anxiolytics, the patient was informed of the treatment options, risks and possible complications. To fulfill our ethical and legal obligations, as recommended by the American Medical Association's Code of Ethics, I have informed the patient of my clinical impression; the nature and purpose of the treatment or procedure; the risks, benefits, and possible complications of the intervention; the alternatives, including doing nothing; the risk(s) and benefit(s) of the alternative treatment(s) or procedure(s); and the risk(s) and benefit(s) of doing nothing. The patient was provided information about the general risks and possible complications associated with the procedure. These may include, but are not limited to: failure to achieve desired goals, infection, bleeding, organ or nerve damage, allergic reactions, paralysis, and death. In addition, the patient was informed of those risks and complications associated to the procedure, such as failure to decrease pain; infection; bleeding; organ or nerve damage with  subsequent damage to sensory, motor, and/or autonomic systems, resulting in permanent pain, numbness, and/or weakness of one or several areas of the body; allergic reactions; (i.e.: anaphylactic reaction); and/or death. Furthermore, the patient was informed of those risks and complications  associated with the medications. These include, but are not limited to: allergic reactions (i.e.: anaphylactic or anaphylactoid reaction(s)); adrenal axis suppression; blood sugar elevation that in diabetics may result in ketoacidosis or comma; water retention that in patients with history of congestive heart failure may result in shortness of breath, pulmonary edema, and decompensation with resultant heart failure; weight gain; swelling or edema; medication-induced neural toxicity; particulate matter embolism and blood vessel occlusion with resultant organ, and/or nervous system infarction; and/or aseptic necrosis of one or more joints. Finally, the patient was informed that Medicine is not an exact science; therefore, there is also the possibility of unforeseen or unpredictable risks and/or possible complications that may result in a catastrophic outcome. The patient indicated having understood very clearly. We have given the patient no guarantees and we have made no promises. Enough time was given to the patient to ask questions, all of which were answered to the patient's satisfaction. Mr. Ganaway has indicated that he wanted to continue with the procedure. Attestation: I, the ordering provider, attest that I have discussed with the patient the benefits, risks, side-effects, alternatives, likelihood of achieving goals, and potential problems during recovery for the procedure that I have provided informed consent. Date  Time: {CHL [facility]-PAIN TIME CHOICES:21018001}  Pre-Procedure Preparation:  Monitoring: As per clinic protocol. Respiration, ETCO2, SpO2, BP, heart rate and rhythm monitor placed and checked for adequate  function Safety Precautions: Patient was assessed for positional comfort and pressure points before starting the procedure. Time-out: I initiated and conducted the "Time-out" before starting the procedure, as per protocol. The patient was asked to participate by confirming the accuracy of the "Time Out" information. Verification of the correct person, site, and procedure were performed and confirmed by me, the nursing staff, and the patient. "Time-out" conducted as per Joint Commission's Universal Protocol (UP.01.01.01). Time:    Description of Procedure:       Position: Sitting Target Area: Knee Joint Approach: Just above the Lateral tibial plateau, lateral to the infrapatellar tendon. Area Prepped: Entire knee area, from the mid-thigh to the mid-shin. Prepping solution: ChloraPrep (2% chlorhexidine gluconate and 70% isopropyl alcohol) Safety Precautions: Aspiration looking for blood return was conducted prior to all injections. At no point did we inject any substances, as a needle was being advanced. No attempts were made at seeking any paresthesias. Safe injection practices and needle disposal techniques used. Medications properly checked for expiration dates. SDV (single dose vial) medications used. Description of the Procedure: Protocol guidelines were followed. The patient was placed in position over the fluoroscopy table. The target area was identified and the area prepped in the usual manner. Skin desensitized using vapocoolant spray. Skin & deeper tissues infiltrated with local anesthetic. Appropriate amount of time allowed to pass for local anesthetics to take effect. The procedure needles were then advanced to the target area. Proper needle placement secured. Negative aspiration confirmed. Solution injected in intermittent fashion, asking for systemic symptoms every 0.5cc of injectate. The needles were then removed and the area cleansed, making sure to leave some of the prepping solution back  to take advantage of its long term bactericidal properties. There were no vitals filed for this visit.  Start Time:   hrs. End Time:   hrs. Materials:  Needle(s) Type: Regular needle Gauge: 22G Length: 3.5-in Medication(s): Please see orders for medications and dosing details.  Imaging Guidance:  Type of Imaging Technique: None used Indication(s): N/A Exposure Time: No patient exposure Contrast: None used. Fluoroscopic Guidance: N/A Ultrasound Guidance: N/A  Interpretation: N/A  Antibiotic Prophylaxis:   Anti-infectives (From admission, onward)   None     Indication(s): None identified  Post-operative Assessment:  Post-procedure Vital Signs:  Pulse Rate:   Temp:   Resp:   BP:   SpO2:    EBL: None  Complications: No immediate post-treatment complications observed by team, or reported by patient.  Note: The patient tolerated the entire procedure well. A repeat set of vitals were taken after the procedure and the patient was kept under observation following institutional policy, for this type of procedure. Post-procedural neurological assessment was performed, showing return to baseline, prior to discharge. The patient was provided with post-procedure discharge instructions, including a section on how to identify potential problems. Should any problems arise concerning this procedure, the patient was given instructions to immediately contact us, at any time, without hesitation. In any case, we plan to contact the patient by telephone for a follow-up status report regarding this interventional procedure.  Comments:  No additional relevant information.  Plan of Care   Imaging Orders  No imaging studies ordered today   Procedure Orders    No procedure(s) ordered today    Medications ordered for procedure: No orders of the defined types were placed in this encounter.  Medications administered: Ricardo W. Bazinet had no medications administered during this visit.  See  the medical record for exact dosing, route, and time of administration.  New Prescriptions   No medications on file   Disposition: Discharge home  Discharge Date & Time: 10/12/2017;   hrs.   Physician-requested Follow-up: No follow-ups on file.  Future Appointments  Date Time Provider [place]  10/12/2017 10:15 AM Milinda Pointer, MD [facility] None   Primary Care Physician: Jodi Marble, MD Location: [facility] Outpatient Pain Management Facility Note by: Gaspar Cola, MD Date: 10/12/2017; Time: 7:29 AM  Disclaimer:  Medicine is not an Chief Strategy Officer. The only guarantee in medicine is that nothing is guaranteed. It is important to note that the decision to proceed with this intervention was based on the information collected from the patient. The Data and conclusions were drawn from the patient's questionnaire, the interview, and the physical examination. Because the information was provided in large part by the patient, it cannot be guaranteed that it has not been purposely or unconsciously manipulated. Every effort has been made to obtain as much relevant data as possible for this evaluation. It is important to note that the conclusions that lead to this procedure are derived in large part from the available data. Always take into account that the treatment will also be dependent on availability of resources and existing treatment guidelines, considered by other Pain Management Practitioners as being common knowledge and practice, at the time of the intervention. For Medico-Legal purposes, it is also important to point out that variation in procedural techniques and pharmacological choices are the acceptable norm. The indications, contraindications, technique, and results of the above procedure should only be interpreted and judged by a Board-Certified Interventional Pain Specialist with extensive familiarity and expertise in the same exact procedure and technique.

## 2017-10-27 ENCOUNTER — Encounter: Payer: Self-pay | Admitting: Emergency Medicine

## 2017-10-27 ENCOUNTER — Other Ambulatory Visit: Payer: Self-pay

## 2017-10-27 ENCOUNTER — Telehealth: Payer: Self-pay | Admitting: Pain Medicine

## 2017-10-27 ENCOUNTER — Emergency Department
Admission: EM | Admit: 2017-10-27 | Discharge: 2017-10-27 | Disposition: A | Discharge status: Home / Self Care | Payer: Medicaid Other | Attending: Emergency Medicine | Admitting: Emergency Medicine

## 2017-10-27 ENCOUNTER — Emergency Department: Payer: Medicaid Other

## 2017-10-27 DIAGNOSIS — M542 Cervicalgia: Secondary | ICD-10-CM | POA: Insufficient documentation

## 2017-10-27 DIAGNOSIS — Z5321 Procedure and treatment not carried out due to patient leaving prior to being seen by health care provider: Secondary | ICD-10-CM | POA: Insufficient documentation

## 2017-10-27 DIAGNOSIS — M545 Low back pain: Secondary | ICD-10-CM | POA: Diagnosis not present

## 2017-10-27 DIAGNOSIS — M25561 Pain in right knee: Secondary | ICD-10-CM | POA: Diagnosis not present

## 2017-10-27 NOTE — Telephone Encounter (Signed)
Patient fell and is in a lot of pain, went to ED yesterday but could not sit in the wheel chair more than an hour so he left, wants to be seen sooner than appt Wed 11-01-17. Please call

## 2017-10-27 NOTE — ED Triage Notes (Signed)
Pt arrives POV to triage with c/o of fall around 1000 yesterday AM. Pt denies LOC and states that fall was mechanical. Pt denies head pain or use of anticoagulants but does state that his neck, lower back, and right knee hurt at this time.

## 2017-10-27 NOTE — Telephone Encounter (Signed)
Please offer patient Monday at 0800 with [employer].  Dr Dossie Arbour schedule is full.

## 2017-10-30 ENCOUNTER — Encounter: Payer: Self-pay | Admitting: Nurse Practitioner

## 2017-10-30 ENCOUNTER — Ambulatory Visit: Payer: Medicaid Other | Attending: Nurse Practitioner | Admitting: Nurse Practitioner

## 2017-10-30 ENCOUNTER — Other Ambulatory Visit: Payer: Self-pay

## 2017-10-30 VITALS — BP 123/89 | HR 66 | Temp 98.3°F | Resp 18 | Ht 75.0 in | Wt 208.0 lb

## 2017-10-30 DIAGNOSIS — M170 Bilateral primary osteoarthritis of knee: Secondary | ICD-10-CM | POA: Diagnosis not present

## 2017-10-30 DIAGNOSIS — M545 Other chronic pain: Secondary | ICD-10-CM

## 2017-10-30 DIAGNOSIS — F1029 Alcohol dependence with unspecified alcohol-induced disorder: Secondary | ICD-10-CM | POA: Diagnosis not present

## 2017-10-30 DIAGNOSIS — I10 Essential (primary) hypertension: Secondary | ICD-10-CM | POA: Insufficient documentation

## 2017-10-30 DIAGNOSIS — J449 Chronic obstructive pulmonary disease, unspecified: Secondary | ICD-10-CM | POA: Insufficient documentation

## 2017-10-30 DIAGNOSIS — M25511 Pain in right shoulder: Secondary | ICD-10-CM | POA: Insufficient documentation

## 2017-10-30 DIAGNOSIS — N5089 Other specified disorders of the male genital organs: Secondary | ICD-10-CM | POA: Diagnosis not present

## 2017-10-30 DIAGNOSIS — M65869 Other synovitis and tenosynovitis, unspecified lower leg: Secondary | ICD-10-CM | POA: Insufficient documentation

## 2017-10-30 DIAGNOSIS — F319 Bipolar disorder, unspecified: Secondary | ICD-10-CM | POA: Diagnosis not present

## 2017-10-30 DIAGNOSIS — M25561 Pain in right knee: Secondary | ICD-10-CM | POA: Diagnosis not present

## 2017-10-30 DIAGNOSIS — F1220 Cannabis dependence, uncomplicated: Secondary | ICD-10-CM | POA: Diagnosis not present

## 2017-10-30 DIAGNOSIS — M25462 Effusion, left knee: Secondary | ICD-10-CM | POA: Diagnosis not present

## 2017-10-30 DIAGNOSIS — G894 Chronic pain syndrome: Secondary | ICD-10-CM | POA: Diagnosis not present

## 2017-10-30 DIAGNOSIS — F419 Anxiety disorder, unspecified: Secondary | ICD-10-CM | POA: Insufficient documentation

## 2017-10-30 DIAGNOSIS — Z79891 Long term (current) use of opiate analgesic: Secondary | ICD-10-CM | POA: Diagnosis not present

## 2017-10-30 DIAGNOSIS — B182 Chronic viral hepatitis C: Secondary | ICD-10-CM | POA: Insufficient documentation

## 2017-10-30 DIAGNOSIS — Z886 Allergy status to analgesic agent status: Secondary | ICD-10-CM | POA: Insufficient documentation

## 2017-10-30 DIAGNOSIS — M25562 Pain in left knee: Secondary | ICD-10-CM | POA: Diagnosis present

## 2017-10-30 DIAGNOSIS — Z8673 Personal history of transient ischemic attack (TIA), and cerebral infarction without residual deficits: Secondary | ICD-10-CM | POA: Insufficient documentation

## 2017-10-30 DIAGNOSIS — K219 Gastro-esophageal reflux disease without esophagitis: Secondary | ICD-10-CM | POA: Diagnosis not present

## 2017-10-30 DIAGNOSIS — M47812 Spondylosis without myelopathy or radiculopathy, cervical region: Secondary | ICD-10-CM | POA: Insufficient documentation

## 2017-10-30 DIAGNOSIS — F209 Schizophrenia, unspecified: Secondary | ICD-10-CM | POA: Diagnosis not present

## 2017-10-30 DIAGNOSIS — M23200 Derangement of unspecified lateral meniscus due to old tear or injury, right knee: Secondary | ICD-10-CM | POA: Insufficient documentation

## 2017-10-30 DIAGNOSIS — M25551 Pain in right hip: Secondary | ICD-10-CM | POA: Diagnosis not present

## 2017-10-30 DIAGNOSIS — M79672 Pain in left foot: Secondary | ICD-10-CM | POA: Insufficient documentation

## 2017-10-30 DIAGNOSIS — F1411 Cocaine abuse, in remission: Secondary | ICD-10-CM | POA: Diagnosis not present

## 2017-10-30 DIAGNOSIS — I2510 Atherosclerotic heart disease of native coronary artery without angina pectoris: Secondary | ICD-10-CM | POA: Diagnosis not present

## 2017-10-30 DIAGNOSIS — R079 Chest pain, unspecified: Secondary | ICD-10-CM | POA: Insufficient documentation

## 2017-10-30 DIAGNOSIS — M25461 Effusion, right knee: Secondary | ICD-10-CM | POA: Insufficient documentation

## 2017-10-30 DIAGNOSIS — I252 Old myocardial infarction: Secondary | ICD-10-CM | POA: Insufficient documentation

## 2017-10-30 DIAGNOSIS — M23203 Derangement of unspecified medial meniscus due to old tear or injury, right knee: Secondary | ICD-10-CM | POA: Insufficient documentation

## 2017-10-30 DIAGNOSIS — G8929 Other chronic pain: Secondary | ICD-10-CM | POA: Diagnosis not present

## 2017-10-30 DIAGNOSIS — Z955 Presence of coronary angioplasty implant and graft: Secondary | ICD-10-CM | POA: Insufficient documentation

## 2017-10-30 DIAGNOSIS — M7918 Myalgia, other site: Secondary | ICD-10-CM | POA: Diagnosis not present

## 2017-10-30 MED ORDER — ORPHENADRINE CITRATE 30 MG/ML IJ SOLN
INTRAMUSCULAR | Status: AC
  Filled 2017-10-30: qty 2

## 2017-10-30 MED ORDER — KETOROLAC TROMETHAMINE 60 MG/2ML IM SOLN
INTRAMUSCULAR | Status: AC
  Filled 2017-10-30: qty 2

## 2017-10-30 MED ORDER — KETOROLAC TROMETHAMINE 60 MG/2ML IM SOLN
60.0000 mg | Freq: Once | INTRAMUSCULAR | Status: AC
  Administered 2017-10-30: 60 mg via INTRAMUSCULAR

## 2017-10-30 MED ORDER — ORPHENADRINE CITRATE 30 MG/ML IJ SOLN
60.0000 mg | Freq: Once | INTRAMUSCULAR | Status: AC
  Administered 2017-10-30: 60 mg via INTRAMUSCULAR

## 2017-10-30 NOTE — Progress Notes (Signed)
Patient's Name: Keith Gibbs  MRN: [id number]  Referring Provider: Jodi Marble, MD  DOB: 04/01/1961  PCP: Jodi Marble, MD  DOS: 10/30/2017  Note by: Vevelyn Francois NP  Service setting: Ambulatory outpatient  Specialty: Interventional Pain Management  Location: [facility] (AMB) Pain Management Facility    Patient type: Established    Primary Reason(s) for Visit: Evaluation of chronic illnesses with exacerbation, or progression (Level of risk: moderate) CC: Back Pain (low) and Knee Pain (bilateral)  HPI  Keith Gibbs is a 57 y.o. year old, male patient, who comes today for a follow-up evaluation. He has Schizophrenia ([place]); Osteoarthritis; COPD (chronic obstructive pulmonary disease) ([place]); Chronic hepatitis C ([place]); GERD (gastroesophageal reflux disease); Swollen testicle; Abnormal ejaculation; Chest pain, unspecified; Bipolar 1 disorder ([place]); Cocaine abuse in remission ([place]); Complete rupture of rotator cuff; Chest pain; Angina at rest [facility]); Severe recurrent major depression without psychotic features ([place]); Coronary artery disease; Alcohol use disorder, moderate, dependence ([place]); Cocaine use disorder, moderate, dependence ([place]); Cannabis use disorder, moderate, dependence ([place]); Overdose of medication, intentional self-harm, sequela ([place]); Tobacco use disorder; Chronic knee pain (Primary Area of Pain)  (Bilateral) (R>L); Chronic low back pain (Secondary Area of Pain) (Bilateral); Chronic hip pain (Tertiary Area of Pain) (Bilateral) (R>L); Chronic shoulder pain (Fourth Area of Pain) (Right); Spondylosis of cervical region without myelopathy or radiculopathy; Chronic pain syndrome; Long term current use of opiate analgesic; Opiate use; Pharmacologic therapy; Disorder of skeletal system; Problems influencing health status; 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); Chronic neck pain (Fifth Area of Pain) ([place]); Chronic musculoskeletal pain; and 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 Keith Gibbs was last seen on 09/11/2017. His primarily concern today is the Back Pain (low) and Knee Pain (bilateral)  Pain Assessment: Location: Lower Back Radiating: radiates from back to hip on the right side  Onset: More than a month ago Duration: Chronic pain Quality: Throbbing, Dull, Sharp Severity: 4 /10 (self-reported pain score)  Note: Reported level is compatible with observation.                          Effect on ADL:   Timing: Intermittent Modifying factors: medications  Further details on both, my assessment(s), as well as the proposed treatment plan, please see below. He is in today as an add-on. He states that he suffered a fall 4 to 5 days ago.  He admits that the fall was due to him moving a table and missed his step coming out of the building. He states that he did go the ER however he did not stay to see the doctor. He was concern about his right knee and lower back secondary to history of surgery. He states he later called our office for an appt. He has had 2 visit only to hour office. He did not come for his follow up appts. He did come in today without any medication.  He admits that he has been out for several weeks. He was  Have a left knee injection. He admits that he is having bruising to his right knee.  Laboratory Chemistry  Inflammation Markers (CRP: Acute Phase) (ESR: Chronic Phase) Lab Results  Component Value Date   CRP 24.6 (H) 09/11/2017   ESRSEDRATE 16 09/11/2017   LATICACIDVEN 1.2 02/20/2017                         Rheumatology Markers Lab Results  Component Value Date   ANA Negative 11/01/2016                        Renal Function Markers Lab Results  Component Value Date   BUN 11 09/11/2017   CREATININE 0.98 09/11/2017   GFRAA 99 09/11/2017   GFRNONAA 85 09/11/2017                              Hepatic Function Markers Lab Results  Component Value Date   AST 15 09/11/2017   ALT 31 02/20/2017   ALBUMIN 4.2 09/11/2017    ALKPHOS 71 09/11/2017   HCVAB >11.0 (H) 11/01/2016   LIPASE 45 02/20/2017                        Electrolytes Lab Results  Component Value Date   NA 139 09/11/2017   K 4.7 09/11/2017   CL 99 09/11/2017   CALCIUM 9.0 09/11/2017   MG 2.1 09/11/2017                        Neuropathy Markers Lab Results  Component Value Date   VITAMINB12 457 09/11/2017   HGBA1C 5.2 12/28/2016                        Bone Pathology Markers Lab Results  Component Value Date   25OHVITD1 35 09/11/2017   25OHVITD2 <1.0 09/11/2017   25OHVITD3 35 09/11/2017                         Coagulation Parameters Lab Results  Component Value Date   INR 1.02 11/01/2016   LABPROT 13.4 11/01/2016   PLT 249 08/30/2017                        Cardiovascular Markers Lab Results  Component Value Date   BNP 12.0 12/26/2016   TROPONINI <0.03 02/20/2017   HGB 15.4 08/30/2017   HCT 44.9 08/30/2017                         CA Markers No results found for: CEA, CA125, LABCA2                      Note: Lab results reviewed.  Recent Diagnostic Imaging Review  Cervical Imaging:  Results for orders placed during the hospital encounter of 10/27/17  DG Cervical Spine Complete   Narrative CLINICAL DATA:  Fall  EXAM: CERVICAL SPINE - COMPLETE 4+ VIEW  COMPARISON:  09/19/2017  FINDINGS: Dens and lateral masses are within normal limits. Cervical alignment within normal limits. Moderate degenerative changes at C5-C6 and C6-C7. Bilateral foraminal narrowing at these levels. Prevertebral soft tissue thickness is normal  IMPRESSION: No radiographic evidence for acute osseous abnormality. Moderate degenerative changes at C5-C6 and C6-C7   Electronically Signed   By: Donavan Foil M.D.   On: 10/27/2017 03:22    Shoulder Imaging:  Shoulder-R DG:  Results for orders placed in visit on 09/18/17  DG Shoulder Right   Narrative CLINICAL DATA:  57 year old male with chronic right shoulder pain. Initial  encounter.  EXAM: RIGHT SHOULDER - 2+ VIEW  COMPARISON:  06/05/2014.  FINDINGS: Acromioclavicular joint degenerative changes with bony overgrowth superior margin.  No abnormal  soft tissue calcifications.  No fracture or dislocation.  Visualized lungs clear.  Remote right third rib fracture.  IMPRESSION: Acromioclavicular joint degenerative changes with bony overgrowth superior margin.   Electronically Signed   By: Genia Del M.D.   On: 09/19/2017 14:44     Lumbosacral Imaging:  Lumbar DG 2-3 views:  Results for orders placed during the hospital encounter of 08/14/17  DG Lumbar Spine 2-3 Views   Narrative CLINICAL DATA:  Low back pain extending into right groin.  EXAM: LUMBAR SPINE - 2-3 VIEW  COMPARISON:  01/19/2017  FINDINGS: Patient is status post fusion from L4-S1 posteriorly. No hardware complicating feature. Normal alignment. Limbus vertebra noted at L3, stable, a normal variant. No acute bony abnormality. No fracture or malalignment. Disc spaces maintained. SI joints are symmetric and unremarkable.  IMPRESSION: Stable postoperative changes of posterior fusion from L4-S1.  No acute bony abnormality.   Electronically Signed   By: Rolm Baptise M.D.   On: 08/14/2017 12:01    Lumbar DG (Complete) 4+V:  Results for orders placed during the hospital encounter of 10/27/17  DG Lumbar Spine Complete   Narrative CLINICAL DATA:  Fall  EXAM: LUMBAR SPINE - COMPLETE 4+ VIEW  COMPARISON:  08/14/2017, 01/19/2017  FINDINGS: Stable lumbar alignment. Status post posterior rod and fixating screws L4 through S1 with interbody devices at L4-L5 and L5-S1. Chronic superior and anterior endplate deformity at L3 with limbus vertebra. Mild degenerative changes at L1-L2. Vertebral body heights are maintained.  IMPRESSION: 1. No acute osseous abnormality 2. Postsurgical changes L4 through S1.   Electronically Signed   By: Donavan Foil M.D.   On:  10/27/2017 03:25     Knee Imaging:  Results for orders placed during the hospital encounter of 08/02/17  MR KNEE RIGHT WO CONTRAST   Narrative CLINICAL DATA:  Injured knee 2 months ago. Persistent pain and swelling.  EXAM: MRI OF THE RIGHT KNEE WITHOUT CONTRAST  TECHNIQUE: Multiplanar, multisequence MR imaging of the knee was performed. No intravenous contrast was administered.  COMPARISON:  Radiographs 03/04/2017  FINDINGS: MENISCI  Medial meniscus: Complex flap type tear involving the posterior horn and mid body junction region with flipped meniscal fragment between the medial femoral condyle and medial capsule. Associated adjacent synovitis.  Lateral meniscus: Horizontal cleavage-type tear involving the anterior horn and mid body junction region.  LIGAMENTS  Cruciates:  Intact.  Mild mucoid degeneration of the ACL.  Collaterals:  Intact.  MCL and pes anserine bursitis.  CARTILAGE  Patellofemoral:  Mild to moderate degenerative chondrosis.  Medial: Advanced degenerative chondrosis with areas of full or near full-thickness cartilage loss, joint space narrowing, early spurring and subchondral edema.  Lateral: Moderate degenerative chondrosis with early joint space narrowing and spurring.  Joint: Large joint effusion and moderate synovitis. Small loose ossified body in the posterior joint space, posterior to the PCL.  Popliteal Fossa: No popliteal mass or Baker's cyst. Mild semimembranosus bursitis.  Extensor Mechanism: The patella retinacular structures are intact and the quadriceps and patellar tendons are intact.  Bones: No acute bony findings. Patchy areas of marrow edema likely stress related.  Other: The knee musculature is grossly normal.  IMPRESSION: 1. Medial and lateral meniscal tears as described above. 2. Intact ligamentous structures and no acute bony findings. Significant tricompartmental degenerative changes. 3. Large joint effusion and  moderate synovitis. Small loose ossified body noted in the posterior joint space.   Electronically Signed   By: Marijo Sanes M.D.   On: 08/02/2017 09:46  Knee-L DG 1-2 views:  Results for orders placed in visit on 09/18/17  DG Knee 1-2 Views Left   Narrative CLINICAL DATA:  57 year old male with chronic knee pain. Initial encounter.  EXAM: LEFT KNEE - 1-2 VIEW  COMPARISON:  None.  FINDINGS: Minimal spurring posterosuperior patella may represent minimal patellofemoral joint degenerative changes.  No significant tibiofemoral joint space narrowing.  Questionable tiny suprapatellar joint effusion.  No fracture or dislocation.  IMPRESSION: Minimal patellofemoral joint degenerative changes.   Electronically Signed   By: Genia Del M.D.   On: 09/19/2017 15:45     Results for orders placed during the hospital encounter of 10/27/17  DG Knee Complete 4 Views Right   Narrative CLINICAL DATA:  Fall  EXAM: RIGHT KNEE - COMPLETE 4+ VIEW  COMPARISON:  03/04/2017  FINDINGS: No acute displaced fracture or malalignment. Mild patellofemoral degenerative change. Mild to moderate medial compartment degenerative change and mild lateral compartment degenerative change. Small to moderate knee effusion.  IMPRESSION: 1. No acute osseous abnormality. 2. Knee effusion   Electronically Signed   By: Donavan Foil M.D.   On: 10/27/2017 03:29    Ankle Imaging:  Results for orders placed during the hospital encounter of 06/19/16  DG Ankle Complete Left   Narrative CLINICAL DATA:  Left ankle pain after falling off ladder.  EXAM: LEFT ANKLE COMPLETE - 3+ VIEW  COMPARISON:  None.  FINDINGS: There is no evidence of fracture, dislocation, or joint effusion. There is no evidence of arthropathy or other focal bone abnormality. Soft tissues are unremarkable.  IMPRESSION: Normal left ankle.   Electronically Signed   By: Marijo Conception, M.D.   On: 06/19/2016  20:49     Foot Imaging: Foot-R DG Complete:  Results for orders placed during the hospital encounter of 01/30/14  DG Foot Complete Right   Narrative CLINICAL DATA:  Right foot pain  EXAM: RIGHT FOOT COMPLETE - 3+ VIEW  COMPARISON:  None.  FINDINGS: Mild degenerative changes are noted at the first interphalangeal joint. No acute fracture or dislocation is seen. No soft tissue changes are noted.  IMPRESSION: Degenerative change without acute abnormality.   Electronically Signed   By: Inez Catalina M.D.   On: 01/30/2014 20:23    Foot-L DG Complete:  Results for orders placed during the hospital encounter of 06/19/16  DG Foot Complete Left   Narrative CLINICAL DATA:  Fifth metatarsal pain. Fell from ladder hanging lights.  EXAM: LEFT FOOT - COMPLETE 3+ VIEW  COMPARISON:  None.  FINDINGS: No acute bone or soft tissue abnormality is present. There is no significant soft tissue slight lateral foot. Fifth metatarsal is intact. Go  IMPRESSION: Negative left foot radiographs.   Electronically Signed   By: San Morelle M.D.   On: 06/19/2016 20:47     Complexity Note: Imaging results reviewed. Results shared with Mr. Bess, using Layman's terms.                         Meds   Current Outpatient Medications:  .  acetaminophen (TYLENOL) 500 MG tablet, Take 2 tablets (1,000 mg total) by mouth every 6 (six) hours as needed. For pain., Disp: 90 tablet, Rfl: 1 .  albuterol (PROAIR HFA) 108 (90 Base) MCG/ACT inhaler, Inhale 2 puffs into the lungs every 6 (six) hours as needed for wheezing or shortness of breath., Disp: 1 Inhaler, Rfl: 1 .  aspirin EC 81 MG tablet, Take 1 tablet (81 mg  total) by mouth daily., Disp: 30 tablet, Rfl: 1 .  atorvastatin (LIPITOR) 40 MG tablet, Take 1 tablet (40 mg total) by mouth daily at 6 PM., Disp: 30 tablet, Rfl: 1 .  buPROPion (WELLBUTRIN SR) 100 MG 12 hr tablet, Take 100 mg by mouth 2 (two) times daily., Disp: , Rfl:  .   cetirizine (ZYRTEC) 10 MG tablet, Take 10 mg by mouth daily., Disp: , Rfl:  .  cyclobenzaprine (FLEXERIL) 10 MG tablet, Take 1 tablet (10 mg total) by mouth 3 (three) times daily as needed for muscle spasms., Disp: 90 tablet, Rfl: 0 .  divalproex (DEPAKOTE) 500 MG DR tablet, Take 500 mg by mouth 2 (two) times daily., Disp: , Rfl: 0 .  DULERA 200-5 MCG/ACT AERO, TAKE 2 PUFFS BY MOUTH TWICE A DAY, Disp: 1 Inhaler, Rfl: 1 .  HYDROcodone-acetaminophen (NORCO) 5-325 MG tablet, Take 1 tablet by mouth every 6 (six) hours as needed for severe pain. Max.: 4/day. Must Last 30 days., Disp: 120 tablet, Rfl: 0 .  isosorbide mononitrate (IMDUR) 30 MG 24 hr tablet, Take 1 tablet (30 mg total) by mouth daily., Disp: 30 tablet, Rfl: 1 .  Melatonin 3 MG TABS, Take 3 mg by mouth at bedtime., Disp: , Rfl:  .  meloxicam (MOBIC) 7.5 MG tablet, Take 7.5 mg by mouth daily., Disp: , Rfl:  .  nitroGLYCERIN (NITROSTAT) 0.4 MG SL tablet, Place 1 tablet (0.4 mg total) under the tongue every 5 (five) minutes x 3 doses as needed for chest pain., Disp: 30 tablet, Rfl: 0 .  omeprazole (PRILOSEC) 40 MG capsule, Take 40 mg by mouth daily., Disp: , Rfl:  .  prazosin (MINIPRESS) 1 MG capsule, Take 1 capsule (1 mg total) by mouth at bedtime., Disp: 30 capsule, Rfl: 1 .  ranolazine (RANEXA) 500 MG 12 hr tablet, Take 500 mg by mouth 2 (two) times daily., Disp: , Rfl:  .  rOPINIRole (REQUIP) 0.5 MG tablet, TAKE 1 TO 2 TABLETS BY MOUTH TWICE DAILY AS NEEDED FOR RESTLESS LEGS., Disp: , Rfl: 1 .  tamsulosin (FLOMAX) 0.4 MG CAPS capsule, Take 1 capsule (0.4 mg total) by mouth daily., Disp: 30 capsule, Rfl: 1 .  ticagrelor (BRILINTA) 60 MG TABS tablet, Take 60 mg by mouth daily., Disp: , Rfl:  .  guaiFENesin-codeine 100-10 MG/5ML syrup, Take 5 mLs by mouth every 4 (four) hours as needed. (Patient not taking: Reported on 10/30/2017), Disp: 120 mL, Rfl: 0 .  predniSONE (DELTASONE) 10 MG tablet, Take 3 tablets once a day until finished (Patient not  taking: Reported on 10/30/2017), Disp: 15 tablet, Rfl: 0 .  sulfamethoxazole-trimethoprim (BACTRIM DS,SEPTRA DS) 800-160 MG tablet, Take 1 tablet by mouth 2 (two) times daily. (Patient not taking: Reported on 10/30/2017), Disp: 20 tablet, Rfl: 0 .  ticagrelor (BRILINTA) 90 MG TABS tablet, Take 1 tablet (90 mg total) by mouth 2 (two) times daily. (Patient not taking: Reported on 10/30/2017), Disp: 60 tablet, Rfl: 0  ROS  Constitutional: Denies any fever or chills Gastrointestinal: No reported hemesis, hematochezia, vomiting, or acute GI distress Musculoskeletal: Denies any acute onset joint swelling, redness, loss of ROM, or weakness Neurological: No reported episodes of acute onset apraxia, aphasia, dysarthria, agnosia, amnesia, paralysis, loss of coordination, or loss of consciousness  Allergies  Keith Gibbs is allergic to latuda [lurasidone hcl]; naproxen; and saphris [asenapine].  [place]  Drug: Keith Gibbs  reports that he does not use drugs. Alcohol:  reports that he drinks alcohol. Tobacco:  reports that  he has been smoking cigarettes.  He has been smoking about 0.50 packs per day. He has never used smokeless tobacco. Medical:  has a past medical history of Anxiety, Arthritis, Bursitis, COPD (chronic obstructive pulmonary disease) ([place]), Depression, GERD (gastroesophageal reflux disease), Hepatitis C (2012), Hypertension, MI (myocardial infarction) ([place]), and Stroke ([place]). Surgical: Keith Gibbs  has a past surgical history that includes Appendectomy; Shoulder surgery (Right, 04/09/2012); Back surgery; Abdominal surgery; Colonoscopy; Cardiac catheterization (Left, 06/10/2016); Cardiac catheterization (N/A, 06/10/2016); Esophagogastroduodenoscopy (egd) with propofol (N/A, 01/05/2017); Colonoscopy with propofol (N/A, 01/05/2017); Coronary angioplasty with stent; and Knee arthroscopy with medial menisectomy (Right, 09/05/2017). Family: family history includes Early death in his father; Heart disease in his  father and mother; Heart failure in his father; Hypertension in his father, mother, and sister; Osteoarthritis in his mother.  Constitutional Exam  General appearance: Well nourished, well developed, and well hydrated. In no apparent acute distress Vitals:   10/30/17 0808  BP: 123/89  Pulse: 66  Resp: 18  Temp: 98.3 F (36.8 C)  SpO2: 100%  Weight: 208 lb (94.3 kg)  Height: 6' 3"  (1.905 m)  Psych/Mental status: Alert, oriented x 3 (person, place, & time)       Eyes: PERLA Respiratory: No evidence of acute respiratory distress  Lumbar Spine Area Exam  Skin & Axial Inspection: Well healed scar from previous spine surgery detected Alignment: Symmetrical Functional ROM: Adequate ROM     Guarding Stability: No instability detected Muscle Tone/Strength: Functionally intact. No obvious neuro-muscular anomalies detected. Sensory (Neurological): Unimpaired Palpation: Non-tender       Provocative Tests: Lumbar Hyperextension and rotation test: Positive bilaterally for facet joint pain. Lumbar Lateral bending test: evaluation deferred today       Patrick's Maneuver: evaluation deferred today                    Gait & Posture Assessment  Ambulation: Patient ambulates using a cane Gait: Antalgic Posture: WNL   Lower Extremity Exam    Side: Right lower extremity  Side: Left lower extremity  Skin & Extremity Inspection: Abrasions to lower thigh with ecchymosis to upper outer calf  Skin & Extremity Inspection:brace worn WNL  Functional ROM: Adequate ROM          Functional ROM: Adequate ROM          Muscle Tone/Strength: Functionally intact. No obvious neuro-muscular anomalies detected.  Muscle Tone/Strength: Functionally intact. No obvious neuro-muscular anomalies detected.  Sensory (Neurological): Unimpaired  Sensory (Neurological): Unimpaired  Palpation: No palpable anomalies  Palpation: No palpable anomalies   Assessment  Primary Diagnosis & Pertinent Problem List: The primary  encounter diagnosis was Chronic low back pain (Secondary Area of Pain) (Bilateral). Diagnoses of Chronic knee pain (Primary Area of Pain)  (Bilateral) (R>L) and Chronic pain syndrome were also pertinent to this visit.  Status Diagnosis  Controlled Controlled Controlled 1. Chronic low back pain (Secondary Area of Pain) (Bilateral)   2. Chronic knee pain (Primary Area of Pain)  (Bilateral) (R>L)   3. Chronic pain syndrome     Problems updated and reviewed during this visit: No problems updated. Plan of Care  Pharmacotherapy (Medications Ordered): Meds ordered this encounter  Medications  . orphenadrine (NORFLEX) injection 60 mg  . ketorolac (TORADOL) injection 60 mg   New Prescriptions   No medications on file   Medications administered today: We administered orphenadrine and ketorolac. Lab-work, procedure(s), and/or referral(s): No orders of the defined types were placed in this encounter.  Imaging  and/or referral(s): None  Interventional management options: Planned, scheduled, and/or pending:    Diagnostic left intra-articular knee injection with local anesthetic and steroid (pending) Explained to the patient because he was new and had missed several appointments and then did not bring in his medication. He would not get a refill on today. He would have to further discuss his treatment regimen with Dr Lowella Dandy He was informed that  this may or may not consist of Narcotics secondary to his non compliance with office policy and guidlelines. He verbalized understanding.    Considering:   Diagnostic left intra-articular knee injection Diagnostic bilateral genicular nerve block Possiblebilateral genicular nerve RFA Diagnostic lumbar facet nerve block Possible bilateral lumbar facet RFA Diagnostic bilateral hip injections Diagnosticright suprascapular nerve block Possible right suprascapular nerve RFA Diagnosticbilateral cervical facet nerve block Possible bilateral cervical  RFA   PRN Procedures:   None at this time    Provider-requested follow-up: Return for Appointment As Scheduled.  Future Appointments  Date Time Provider [place]  11/01/2017  8:30 AM Milinda Pointer, MD [facility] None   Primary Care Physician: Jodi Marble, MD Location: [facility] Outpatient Pain Management Facility Note by: Vevelyn Francois NP Date: 10/30/2017; Time: 10:58 AM  Pain Score Disclaimer: We use the NRS-11 scale. This is a self-reported, subjective measurement of pain severity with only modest accuracy. It is used primarily to identify changes within a particular patient. It must be understood that outpatient pain scales are significantly less accurate that those used for research, where they can be applied under ideal controlled circumstances with minimal exposure to variables. In reality, the score is likely to be a combination of pain intensity and pain affect, where pain affect describes the degree of emotional arousal or changes in action readiness caused by the sensory experience of pain. Factors such as social and work situation, setting, emotional state, anxiety levels, expectation, and prior pain experience may influence pain perception and show large inter-individual differences that may also be affected by time variables.  Patient instructions provided during this appointment: Patient Instructions  ____________________________________________________________________________________________  Appointment Policy Summary  It is our goal and responsibility to provide the medical community with assistance in the evaluation and management of patients with chronic pain. Unfortunately our resources are limited. Because we do not have an unlimited amount of time, or available appointments, we are required to closely monitor and manage their use. The following rules exist to maximize their use:  Patient's responsibilities: 1. Punctuality:  At what time should I  arrive? You should be physically present in our office 30 minutes before your scheduled appointment. Your scheduled appointment is with your assigned healthcare provider. However, it takes 5-10 minutes to be "checked-in", and another 15 minutes for the nurses to do the admission. If you arrive to our office at the time you were given for your appointment, you will end up being at least 20-25 minutes late to your appointment with the provider. 2. Tardiness:  What happens if I arrive only a few minutes after my scheduled appointment time? You will need to reschedule your appointment. The cutoff is your appointment time. This is why it is so important that you arrive at least 30 minutes before that appointment. If you have an appointment scheduled for 10:00 AM and you arrive at 10:01, you will be required to reschedule your appointment.  3. Plan ahead:  Always assume that you will encounter traffic on your way in. Plan for it. If you are dependent on a  driver, make sure they understand these rules and the need to arrive early. 4. Other appointments and responsibilities:  Avoid scheduling any other appointments before or after your pain clinic appointments.  5. Be prepared:  Write down everything that you need to discuss with your healthcare provider and give this information to the admitting nurse. Write down the medications that you will need refilled. Bring your pills and bottles (even the empty ones), to all of your appointments, except for those where a procedure is scheduled. 6. No children or pets:  Find someone to take care of them. It is not appropriate to bring them in. 7. Scheduling changes:  We request "advanced notification" of any changes or cancellations. 8. Advanced notification:  Defined as a time period of more than 24 hours prior to the originally scheduled appointment. This allows for the appointment to be offered to other patients. 9. Rescheduling:  When a visit is rescheduled, it  will require the cancellation of the original appointment. For this reason they both fall within the category of "Cancellations".  10. Cancellations:  They require advanced notification. Any cancellation less than 24 hours before the  appointment will be recorded as a "No Show". 11. No Show:  Defined as an unkept appointment where the patient failed to notify or declare to the practice their intention or inability to keep the appointment.  Corrective process for repeat offenders:  1. Tardiness: Three (3) episodes of rescheduling due to late arrivals will be recorded as one (1) "No Show". 2. Cancellation or reschedule: Three (3) cancellations or rescheduling will be recorded as one (1) "No Show". 3. "No Shows": Three (3) "No Shows" within a 12 month period will result in discharge from the practice. ____________________________________________________________________________________________  ____________________________________________________________________________________________  Medication Rules  Applies to: All patients receiving prescriptions (written or electronic).  Pharmacy of record: Pharmacy where electronic prescriptions will be sent. If written prescriptions are taken to a different pharmacy, please inform the nursing staff. The pharmacy listed in the electronic medical record should be the one where you would like electronic prescriptions to be sent.  Prescription refills: Only during scheduled appointments. Applies to both, written and electronic prescriptions.  NOTE: The following applies primarily to controlled substances (Opioid* Pain Medications).   Patient's responsibilities: 1. Pain Pills: Bring all pain pills to every appointment (except for procedure appointments). 2. Pill Bottles: Bring pills in original pharmacy bottle. Always bring newest bottle. Bring bottle, even if empty. 3. Medication refills: You are responsible for knowing and keeping track of what  medications you need refilled. The day before your appointment, write a list of all prescriptions that need to be refilled. Bring that list to your appointment and give it to the admitting nurse. Prescriptions will be written only during appointments. If you forget a medication, it will not be "Called in", "Faxed", or "electronically sent". You will need to get another appointment to get these prescribed. 4. Prescription Accuracy: You are responsible for carefully inspecting your prescriptions before leaving our office. Have the discharge nurse carefully go over each prescription with you, before taking them home. Make sure that your name is accurately spelled, that your address is correct. Check the name and dose of your medication to make sure it is accurate. Check the number of pills, and the written instructions to make sure they are clear and accurate. Make sure that you are given enough medication to last until your next medication refill appointment. 5. Taking Medication: Take medication as prescribed. Never take more  pills than instructed. Never take medication more frequently than prescribed. Taking less pills or less frequently is permitted and encouraged, when it comes to controlled substances (written prescriptions).  6. Inform other Doctors: Always inform, all of your healthcare providers, of all the medications you take. 7. Pain Medication from other Providers: You are not allowed to accept any additional pain medication from any other Doctor or Healthcare provider. There are two exceptions to this rule. (see below) In the event that you require additional pain medication, you are responsible for notifying us, as stated below. 8. Medication Agreement: You are responsible for carefully reading and following our Medication Agreement. This must be signed before receiving any prescriptions from our practice. Safely store a copy of your signed Agreement. Violations to the Agreement will result in no  further prescriptions. (Additional copies of our Medication Agreement are available upon request.) 9. Laws, Rules, & Regulations: All patients are expected to follow all Federal and [employer], [employer], Rules, [employer]. Ignorance of the Laws does not constitute a valid excuse. The use of any illegal substances is prohibited. 10. Adopted CDC guidelines & recommendations: Target dosing levels will be at or below 60 MME/day. Use of benzodiazepines** is not recommended.  Exceptions: There are only two exceptions to the rule of not receiving pain medications from other Healthcare Providers. 1. Exception #1 (Emergencies): In the event of an emergency (i.e.: accident requiring emergency care), you are allowed to receive additional pain medication. However, you are responsible for: As soon as you are able, call our office [phone], at any time of the day or night, and leave a message stating your name, the date and nature of the emergency, and the name and dose of the medication prescribed. In the event that your call is answered by a member of our staff, make sure to document and save the date, time, and the name of the person that took your information.  2. Exception #2 (Planned Surgery): In the event that you are scheduled by another doctor or dentist to have any type of surgery or procedure, you are allowed (for a period no longer than 30 days), to receive additional pain medication, for the acute post-op pain. However, in this case, you are responsible for picking up a copy of our "Post-op Pain Management for Surgeons" handout, and giving it to your surgeon or dentist. This document is available at our office, and does not require an appointment to obtain it. Simply go to our office during business hours (Monday-Thursday from 8:00 AM to 4:00 PM) (Friday 8:00 AM to 12:00 Noon) or if you have a scheduled appointment with [place], prior to your surgery, and ask for it by name. In addition, you will need to  provide [place] with your name, name of your surgeon, type of surgery, and date of procedure or surgery.  *Opioid medications include: morphine, codeine, oxycodone, oxymorphone, hydrocodone, hydromorphone, meperidine, tramadol, tapentadol, buprenorphine, fentanyl, methadone. **Benzodiazepine medications include: diazepam (Valium), alprazolam (Xanax), clonazepam (Klonopine), lorazepam (Ativan), clorazepate (Tranxene), chlordiazepoxide (Librium), estazolam (Prosom), oxazepam (Serax), temazepam (Restoril), triazolam (Halcion) (Last updated: 09/21/2017) ____________________________________________________________________________________________

## 2017-10-30 NOTE — Patient Instructions (Signed)
____________________________________________________________________________________________  Appointment Policy Summary  It is our goal and responsibility to provide the medical community with assistance in the evaluation and management of patients with chronic pain. Unfortunately our resources are limited. Because we do not have an unlimited amount of time, or available appointments, we are required to closely monitor and manage their use. The following rules exist to maximize their use:  Patient's responsibilities: 1. Punctuality:  At what time should I arrive? You should be physically present in our office 30 minutes before your scheduled appointment. Your scheduled appointment is with your assigned healthcare provider. However, it takes 5-10 minutes to be "checked-in", and another 15 minutes for the nurses to do the admission. If you arrive to our office at the time you were given for your appointment, you will end up being at least 20-25 minutes late to your appointment with the provider. 2. Tardiness:  What happens if I arrive only a few minutes after my scheduled appointment time? You will need to reschedule your appointment. The cutoff is your appointment time. This is why it is so important that you arrive at least 30 minutes before that appointment. If you have an appointment scheduled for 10:00 AM and you arrive at 10:01, you will be required to reschedule your appointment.  3. Plan ahead:  Always assume that you will encounter traffic on your way in. Plan for it. If you are dependent on a driver, make sure they understand these rules and the need to arrive early. 4. Other appointments and responsibilities:  Avoid scheduling any other appointments before or after your pain clinic appointments.  5. Be prepared:  Write down everything that you need to discuss with your healthcare provider and give this information to the admitting nurse. Write down the medications that you will need  refilled. Bring your pills and bottles (even the empty ones), to all of your appointments, except for those where a procedure is scheduled. 6. No children or pets:  Find someone to take care of them. It is not appropriate to bring them in. 7. Scheduling changes:  We request "advanced notification" of any changes or cancellations. 8. Advanced notification:  Defined as a time period of more than 24 hours prior to the originally scheduled appointment. This allows for the appointment to be offered to other patients. 9. Rescheduling:  When a visit is rescheduled, it will require the cancellation of the original appointment. For this reason they both fall within the category of "Cancellations".  10. Cancellations:  They require advanced notification. Any cancellation less than 24 hours before the  appointment will be recorded as a "No Show". 11. No Show:  Defined as an unkept appointment where the patient failed to notify or declare to the practice their intention or inability to keep the appointment.  Corrective process for repeat offenders:  1. Tardiness: Three (3) episodes of rescheduling due to late arrivals will be recorded as one (1) "No Show". 2. Cancellation or reschedule: Three (3) cancellations or rescheduling will be recorded as one (1) "No Show". 3. "No Shows": Three (3) "No Shows" within a 12 month period will result in discharge from the practice. ____________________________________________________________________________________________  ____________________________________________________________________________________________  Medication Rules  Applies to: All patients receiving prescriptions (written or electronic).  Pharmacy of record: Pharmacy where electronic prescriptions will be sent. If written prescriptions are taken to a different pharmacy, please inform the nursing staff. The pharmacy listed in the electronic medical record should be the one where you would like  electronic prescriptions  to be sent.  Prescription refills: Only during scheduled appointments. Applies to both, written and electronic prescriptions.  NOTE: The following applies primarily to controlled substances (Opioid* Pain Medications).   Patient's responsibilities: 1. Pain Pills: Bring all pain pills to every appointment (except for procedure appointments). 2. Pill Bottles: Bring pills in original pharmacy bottle. Always bring newest bottle. Bring bottle, even if empty. 3. Medication refills: You are responsible for knowing and keeping track of what medications you need refilled. The day before your appointment, write a list of all prescriptions that need to be refilled. Bring that list to your appointment and give it to the admitting nurse. Prescriptions will be written only during appointments. If you forget a medication, it will not be "Called in", "Faxed", or "electronically sent". You will need to get another appointment to get these prescribed. 4. Prescription Accuracy: You are responsible for carefully inspecting your prescriptions before leaving our office. Have the discharge nurse carefully go over each prescription with you, before taking them home. Make sure that your name is accurately spelled, that your address is correct. Check the name and dose of your medication to make sure it is accurate. Check the number of pills, and the written instructions to make sure they are clear and accurate. Make sure that you are given enough medication to last until your next medication refill appointment. 5. Taking Medication: Take medication as prescribed. Never take more pills than instructed. Never take medication more frequently than prescribed. Taking less pills or less frequently is permitted and encouraged, when it comes to controlled substances (written prescriptions).  6. Inform other Doctors: Always inform, all of your healthcare providers, of all the medications you take. 7. Pain  Medication from other Providers: You are not allowed to accept any additional pain medication from any other Doctor or Healthcare provider. There are two exceptions to this rule. (see below) In the event that you require additional pain medication, you are responsible for notifying us, as stated below. 8. Medication Agreement: You are responsible for carefully reading and following our Medication Agreement. This must be signed before receiving any prescriptions from our practice. Safely store a copy of your signed Agreement. Violations to the Agreement will result in no further prescriptions. (Additional copies of our Medication Agreement are available upon request.) 9. Laws, Rules, & Regulations: All patients are expected to follow all Federal and [employer], [employer], Rules, [employer]. Ignorance of the Laws does not constitute a valid excuse. The use of any illegal substances is prohibited. 10. Adopted CDC guidelines & recommendations: Target dosing levels will be at or below 60 MME/day. Use of benzodiazepines** is not recommended.  Exceptions: There are only two exceptions to the rule of not receiving pain medications from other Healthcare Providers. 1. Exception #1 (Emergencies): In the event of an emergency (i.e.: accident requiring emergency care), you are allowed to receive additional pain medication. However, you are responsible for: As soon as you are able, call our office [phone], at any time of the day or night, and leave a message stating your name, the date and nature of the emergency, and the name and dose of the medication prescribed. In the event that your call is answered by a member of our staff, make sure to document and save the date, time, and the name of the person that took your information.  2. Exception #2 (Planned Surgery): In the event that you are scheduled by another doctor or dentist to have any type of surgery  or procedure, you are allowed (for a period no longer than  30 days), to receive additional pain medication, for the acute post-op pain. However, in this case, you are responsible for picking up a copy of our "Post-op Pain Management for Surgeons" handout, and giving it to your surgeon or dentist. This document is available at our office, and does not require an appointment to obtain it. Simply go to our office during business hours (Monday-Thursday from 8:00 AM to 4:00 PM) (Friday 8:00 AM to 12:00 Noon) or if you have a scheduled appointment with [place], prior to your surgery, and ask for it by name. In addition, you will need to provide [place] with your name, name of your surgeon, type of surgery, and date of procedure or surgery.  *Opioid medications include: morphine, codeine, oxycodone, oxymorphone, hydrocodone, hydromorphone, meperidine, tramadol, tapentadol, buprenorphine, fentanyl, methadone. **Benzodiazepine medications include: diazepam (Valium), alprazolam (Xanax), clonazepam (Klonopine), lorazepam (Ativan), clorazepate (Tranxene), chlordiazepoxide (Librium), estazolam (Prosom), oxazepam (Serax), temazepam (Restoril), triazolam (Halcion) (Last updated: 09/21/2017) ____________________________________________________________________________________________

## 2017-10-30 NOTE — Progress Notes (Signed)
Nursing Pain Medication Assessment:  Safety precautions to be maintained throughout the outpatient stay will include: orient to surroundings, keep bed in low position, maintain call bell within reach at all times, provide assistance with transfer out of bed and ambulation.  Medication Inspection Compliance: Mr. Coker did not comply with our request to bring his pills to be counted. He was reminded that bringing the medication bottles, even when empty, is a requirement.  Medication: None brought in. Pill/Patch Count: None available to be counted. Bottle Appearance: No container available. Did not bring bottle(s) to appointment. Filled Date: N/A Last Medication intake:  a few weeks ago

## 2017-11-01 ENCOUNTER — Ambulatory Visit: Payer: Medicaid Other | Attending: Pain Medicine | Admitting: Pain Medicine

## 2017-11-01 ENCOUNTER — Encounter: Payer: Self-pay | Admitting: Pain Medicine

## 2017-11-01 ENCOUNTER — Other Ambulatory Visit: Payer: Self-pay

## 2017-11-01 VITALS — BP 143/90 | HR 53 | Temp 98.4°F | Ht 75.0 in | Wt 202.0 lb

## 2017-11-01 DIAGNOSIS — B182 Chronic viral hepatitis C: Secondary | ICD-10-CM | POA: Diagnosis not present

## 2017-11-01 DIAGNOSIS — M545 Other chronic pain: Secondary | ICD-10-CM

## 2017-11-01 DIAGNOSIS — M25552 Pain in left hip: Secondary | ICD-10-CM | POA: Diagnosis not present

## 2017-11-01 DIAGNOSIS — M5030 Other cervical disc degeneration, unspecified cervical region: Secondary | ICD-10-CM

## 2017-11-01 DIAGNOSIS — M19011 Primary osteoarthritis, right shoulder: Secondary | ICD-10-CM

## 2017-11-01 DIAGNOSIS — M5136 Other intervertebral disc degeneration, lumbar region: Secondary | ICD-10-CM | POA: Diagnosis not present

## 2017-11-01 DIAGNOSIS — T50902S Poisoning by unspecified drugs, medicaments and biological substances, intentional self-harm, sequela: Secondary | ICD-10-CM

## 2017-11-01 DIAGNOSIS — N5089 Other specified disorders of the male genital organs: Secondary | ICD-10-CM | POA: Insufficient documentation

## 2017-11-01 DIAGNOSIS — Z91199 Patient's noncompliance with other medical treatment and regimen due to unspecified reason: Secondary | ICD-10-CM | POA: Insufficient documentation

## 2017-11-01 DIAGNOSIS — F1020 Alcohol dependence, uncomplicated: Secondary | ICD-10-CM

## 2017-11-01 DIAGNOSIS — Z9119 Patient's noncompliance with other medical treatment and regimen: Secondary | ICD-10-CM | POA: Diagnosis not present

## 2017-11-01 DIAGNOSIS — M8949 Other hypertrophic osteoarthropathy, multiple sites: Secondary | ICD-10-CM

## 2017-11-01 DIAGNOSIS — Z720 Tobacco use: Secondary | ICD-10-CM | POA: Insufficient documentation

## 2017-11-01 DIAGNOSIS — M170 Bilateral primary osteoarthritis of knee: Secondary | ICD-10-CM

## 2017-11-01 DIAGNOSIS — Z9114 Patient's other noncompliance with medication regimen: Secondary | ICD-10-CM | POA: Insufficient documentation

## 2017-11-01 DIAGNOSIS — M25511 Pain in right shoulder: Secondary | ICD-10-CM | POA: Diagnosis not present

## 2017-11-01 DIAGNOSIS — F1229 Cannabis dependence with unspecified cannabis-induced disorder: Secondary | ICD-10-CM | POA: Diagnosis not present

## 2017-11-01 DIAGNOSIS — M25551 Pain in right hip: Secondary | ICD-10-CM | POA: Diagnosis not present

## 2017-11-01 DIAGNOSIS — M25561 Pain in right knee: Secondary | ICD-10-CM

## 2017-11-01 DIAGNOSIS — F1029 Alcohol dependence with unspecified alcohol-induced disorder: Secondary | ICD-10-CM | POA: Insufficient documentation

## 2017-11-01 DIAGNOSIS — F1420 Cocaine dependence, uncomplicated: Secondary | ICD-10-CM

## 2017-11-01 DIAGNOSIS — F339 Major depressive disorder, recurrent, unspecified: Secondary | ICD-10-CM | POA: Diagnosis not present

## 2017-11-01 DIAGNOSIS — G8929 Other chronic pain: Secondary | ICD-10-CM | POA: Diagnosis present

## 2017-11-01 DIAGNOSIS — M75101 Unspecified rotator cuff tear or rupture of right shoulder, not specified as traumatic: Secondary | ICD-10-CM

## 2017-11-01 DIAGNOSIS — M47816 Spondylosis without myelopathy or radiculopathy, lumbar region: Secondary | ICD-10-CM

## 2017-11-01 DIAGNOSIS — M47812 Spondylosis without myelopathy or radiculopathy, cervical region: Secondary | ICD-10-CM

## 2017-11-01 DIAGNOSIS — Z79891 Long term (current) use of opiate analgesic: Secondary | ICD-10-CM | POA: Insufficient documentation

## 2017-11-01 DIAGNOSIS — M25562 Pain in left knee: Secondary | ICD-10-CM | POA: Diagnosis not present

## 2017-11-01 DIAGNOSIS — M160 Bilateral primary osteoarthritis of hip: Secondary | ICD-10-CM

## 2017-11-01 DIAGNOSIS — M159 Polyosteoarthritis, unspecified: Secondary | ICD-10-CM | POA: Insufficient documentation

## 2017-11-01 DIAGNOSIS — G894 Chronic pain syndrome: Secondary | ICD-10-CM

## 2017-11-01 DIAGNOSIS — J449 Chronic obstructive pulmonary disease, unspecified: Secondary | ICD-10-CM | POA: Diagnosis not present

## 2017-11-01 DIAGNOSIS — M7918 Myalgia, other site: Secondary | ICD-10-CM

## 2017-11-01 DIAGNOSIS — K219 Gastro-esophageal reflux disease without esophagitis: Secondary | ICD-10-CM | POA: Diagnosis not present

## 2017-11-01 DIAGNOSIS — M47817 Spondylosis without myelopathy or radiculopathy, lumbosacral region: Secondary | ICD-10-CM

## 2017-11-01 DIAGNOSIS — M150 Primary generalized (osteo)arthritis: Secondary | ICD-10-CM

## 2017-11-01 DIAGNOSIS — M12811 Other specific arthropathies, not elsewhere classified, right shoulder: Secondary | ICD-10-CM

## 2017-11-01 DIAGNOSIS — M542 Cervicalgia: Secondary | ICD-10-CM

## 2017-11-01 DIAGNOSIS — F1220 Cannabis dependence, uncomplicated: Secondary | ICD-10-CM

## 2017-11-01 MED ORDER — CYCLOBENZAPRINE HCL 10 MG PO TABS: 10.0000 mg | ORAL_TABLET | Freq: Three times a day (TID) | ORAL | 0 refills | Status: DC | PRN

## 2017-11-01 MED ORDER — HYDROCODONE-ACETAMINOPHEN 5-325 MG PO TABS: 1.0000 | ORAL_TABLET | Freq: Four times a day (QID) | ORAL | 0 refills | Status: DC | PRN

## 2017-11-01 NOTE — Progress Notes (Signed)
Nursing Pain Medication Assessment:  Safety precautions to be maintained throughout the outpatient stay will include: orient to surroundings, keep bed in low position, maintain call bell within reach at all times, provide assistance with transfer out of bed and ambulation.  Medication Inspection Compliance: Pill count conducted under aseptic conditions, in front of the patient. Neither the pills nor the bottle was removed from the patient's sight at any time. Once count was completed pills were immediately returned to the patient in their original bottle.  Medication: Hydrocodone/APAP Pill/Patch Count: 0 of 120 pills remain Pill/Patch Appearance: Markings consistent with prescribed medication Bottle Appearance: Standard pharmacy container. Clearly labeled. Filled Date: 02 / 25 / 2019 Last Medication intake:  Ran out of medicine more than 48 hours ago

## 2017-11-01 NOTE — Progress Notes (Addendum)
Patient's Name: Keith Gibbs  MRN: [id number]  Referring Provider: Jodi Marble, MD  DOB: [birth year]  PCP: Jodi Marble, MD  DOS: 11/01/2017  Note by: Gaspar Cola, MD  Service setting: Ambulatory outpatient  Specialty: Interventional Pain Management  Location: [facility] (AMB) Pain Management Facility    Patient type: Established   Primary Reason(s) for Visit: Evaluation of chronic illnesses with exacerbation, or progression (Level of risk: moderate) CC: Back Pain (lower)  HPI  Keith Gibbs is a 57 y.o. year old, male patient, who comes today for a follow-up evaluation. He has Schizophrenia ([place]); COPD (chronic obstructive pulmonary disease) ([place]); Chronic hepatitis C ([place]); GERD (gastroesophageal reflux disease); Swollen testicle; Abnormal ejaculation; Chest pain, unspecified; Bipolar 1 disorder ([place]); Cocaine abuse in remission ([place]); Chest pain; Angina at rest [facility]); Severe recurrent major depression without psychotic features ([place]); Coronary artery disease; Alcohol use disorder, moderate, dependence ([place]); Cocaine use disorder, moderate, dependence ([place]); Cannabis use disorder, moderate, dependence ([place]); Overdose of medication, intentional self-harm, sequela ([place]); Tobacco use disorder; Chronic knee pain (Primary Area of Pain)  (Bilateral) (R>L); Chronic low back pain (Secondary Area of Pain) (Bilateral); Chronic hip pain (Tertiary Area of Pain) (Bilateral) (R>L); Chronic shoulder pain (Fourth Area of Pain) (Right); Spondylosis without myelopathy or radiculopathy, cervical region; Chronic pain syndrome; Long term current use of opiate analgesic; Opiate use; Pharmacologic therapy; Disorder of skeletal system; Problems influencing health status; 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); Chronic neck pain (Fifth Area of Pain) ([place]); Chronic musculoskeletal pain; 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 Keith Gibbs was last seen on 10/27/2017. His primarily concern today is the Back Pain (lower)  Pain Assessment: Location: Lower Back Radiating: radiates to both hip down to knee but more on the left side Onset: More than a month ago Duration: Chronic pain Quality: Shooting, Sharp, Grimacing Severity: 4 /10 (self-reported pain score)  Note: Reported level is inconsistent with clinical observations. Clinically the patient looks like a 1/10 A 1/10 is viewed as "Mild" and described as nagging, annoying, but not interfering with basic activities of daily living (ADL). Keith Gibbs is able to eat, bathe, get dressed, do toileting (being able to get on and off the toilet and perform personal hygiene functions), transfer (move in and out of bed or a chair without assistance), and maintain continence (able to control bladder and bowel functions). Physiologic parameters such as blood pressure and heart rate apear wnl. Keith Gibbs does not seem to understand the use of our objective pain scale When using our objective Pain Scale, levels between 6 and 10/10 are said to belong in an emergency room, as it progressively worsens from a 6/10, described as severely limiting, requiring emergency care not usually available at an outpatient pain management facility. At a 6/10 level, communication becomes difficult and requires great effort. Assistance to reach the emergency department may be required. Facial flushing and profuse sweating along with potentially dangerous increases in heart rate and blood pressure will be  evident. Effect on ADL: prolonged walking Timing: Constant Modifying factors: medications   Today the patient returns to discuss his current situation with regards to his treatment.  I was very honest  with the patient and I shared my concerns with him today.  The first concern is that he was found to be a "very high risk" for substance use disorder.  On the very first visit I informed the patient that my specialty was interventional pain management and that I was going to primarily treat him using those techniques.  Following this the patient was scheduled to return for an intra-articular knee injection on the left side.  He did not keep that visit or his following visit and it was not until he ran out of medicine that he contacted [place] and came in to have the medicine refilled.  To make matters worse, he did not follow the written instructions that we have provided him regarding our rules and regulations and did not bring his pills to be counted.  Because of this we did not refill the medication on that visit and I had him return today so that we could talk to him about what is going on.  I pointed to the patient that from the very beginning I was clear that I am not taking patient for medication management only.  I communicated to him my concern that he was not keeping his visits for his interventional treatments but he was for his medication refills.  I gave him an opportunity to come clean and explain the reason why this happened.  He indicated that he got sick and he could not come in for those 2 visits and that he was having some problems with transportation.  I then proceeded to ask him who had brought him into the clinic on Monday and today and he indicated that it was his girlfriend.  At this point I again pointed out how I was concerned that it was easy for him to get a ride to the hospital when he was coming in for a refill, but not for interventions.  Today I gave him the final warning regarding his  noncompliance with our treatment and missing the 2 appointments.  I informed him that should he miss 1 more appointment, we will need to let him go.  He indicated understanding and accepting.  Today I have again reschedule him for that knee injection and I provided him with 1 refill on his medicine which I made clear was the last one he would be getting should he kept up the noncompliance.  He understood and accepted.  Another concern that I have with this patient is that he initially was taking the hydrocodone/APAP every 4 hours.  When I gave him his prescription I made it clear that this was to be taken 1 every 6 hours.  By having come in after he ran out of pills he may be considering the fact that he could have taking more medicine than prescribed and ran out of them early.  If he is getting more medication from somebody else, is through illegal means since his PMP today was within normal limits.  I am not saying that he is doing this, but there is no way for me to confirm that he is not since he is not cooperating or following instructions with regards to his medication responsibilities.  In any case, he has been warned that he needs to get with the program or we will discontinue the use of these medications.  He understood and accepted.  Further details on both, my assessment(s), as well as the proposed treatment plan, please see below.  Controlled Substance Pharmacotherapy Assessment REMS (Risk  Evaluation and Mitigation Strategy)  Analgesic: Hydrocodone/acetaminophen 5/325 mg 1 tablet in the 6 hours (20 mg/day of Hydrocodone) Highest recorded MME/day:44m/day MME/day:261mday BrChauncey FischerRN  11/01/2017  8:38 AM  Sign at close encounter Nursing Pain Medication Assessment:  Safety precautions to be maintained throughout the outpatient stay will include: orient to surroundings, keep bed in low position, maintain call bell within reach at all times, provide assistance with transfer out of  bed and ambulation.  Medication Inspection Compliance: Pill count conducted under aseptic conditions, in front of the patient. Neither the pills nor the bottle was removed from the patient's sight at any time. Once count was completed pills were immediately returned to the patient in their original bottle.  Medication: Hydrocodone/APAP Pill/Patch Count: 0 of 120 pills remain Pill/Patch Appearance: Markings consistent with prescribed medication Bottle Appearance: Standard pharmacy container. Clearly labeled. Filled Date: 02 / 25 / 2019 Last Medication intake:  Ran out of medicine more than 48 hours ago   Pharmacokinetics: Liberation and absorption (onset of action): WNL Distribution (time to peak effect): WNL Metabolism and excretion (duration of action): WNL         Pharmacodynamics: Desired effects: Analgesia: Keith Gibbs >50% benefit. Functional ability: Patient reports that medication allows him to accomplish basic ADLs Clinically meaningful improvement in function (CMIF): Sustained CMIF goals met Perceived effectiveness: Described as relatively effective, allowing for increase in activities of daily living (ADL) Undesirable effects: Side-effects or Adverse reactions: None reported Monitoring: [place] PMP: Online review of the past 1253-monthriod conducted. Compliant with practice rules and regulations Last UDS on record: Summary  Date Value Ref Range Status  09/11/2017 FINAL  Final    Comment:    ==================================================================== TOXASSURE COMP DRUG ANALYSIS,UR ==================================================================== Test                             Result       Flag       Units Drug Present and Declared for Prescription Verification   Hydrocodone                    1255         EXPECTED   ng/mg creat   Dihydrocodeine                 168          EXPECTED   ng/mg creat   Norhydrocodone                 820          EXPECTED    ng/mg creat    Sources of hydrocodone include scheduled prescription    medications. Dihydrocodeine and norhydrocodone are expected    metabolites of hydrocodone. Dihydrocodeine is also available as a    scheduled prescription medication.   Desmethylcyclobenzaprine       PRESENT      EXPECTED    Desmethylcyclobenzaprine is an expected metabolite of    cyclobenzaprine.   Bupropion                      PRESENT      EXPECTED   Hydroxybupropion               PRESENT      EXPECTED    Hydroxybupropion is an expected metabolite of bupropion.   Acetaminophen  PRESENT      EXPECTED Drug Present not Declared for Prescription Verification   Guaifenesin                    PRESENT      UNEXPECTED    Guaifenesin may be administered as an over-the-counter or    prescription drug; it may also be present as a breakdown product    of methocarbamol. Drug Absent but Declared for Prescription Verification   Salicylate                     Not Detected UNEXPECTED    Aspirin, as indicated in the declared medication list, is not    always detected even when used as directed. ==================================================================== Test                      Result    Flag   Units      Ref Range   Creatinine              74               mg/dL      >=20 ==================================================================== Declared Medications:  The flagging and interpretation on this report are based on the  following declared medications.  Unexpected results may arise from  inaccuracies in the declared medications.  **Note: The testing scope of this panel includes these medications:  Bupropion  Cyclobenzaprine  Hydrocodone (Hydrocodone-Acetaminophen)  **Note: The testing scope of this panel does not include small to  moderate amounts of these reported medications:  Acetaminophen  Acetaminophen (Hydrocodone-Acetaminophen)  Aspirin (Aspirin 81)  **Note: The testing scope of this  panel does not include following  reported medications:  Albuterol  Atorvastatin  Cetirizine  Isosorbide Mononitrate  Melatonin  Mometasone  Multivitamin  Nitroglycerin  Omeprazole  Prazosin (Minipress)  Ranolazine (Ranexa)  Ropinirole (Requip)  Tamsulosin (Flomax)  Ticagrelor (Brilinta) ==================================================================== For clinical consultation, please call [phone]. ====================================================================    UDS interpretation: Compliant          Medication Assessment Form: Reviewed. Patient indicates being compliant with therapy Treatment compliance: Compliant Risk Assessment Profile: Aberrant behavior: See prior evaluations. None observed or detected today Comorbid factors increasing risk of overdose: See prior notes. No additional risks detected today Risk of substance use disorder (SUD): Low Opioid Risk Tool - 09/18/17 0816      Family History of Substance Abuse   Alcohol  Positive Male    Illegal Drugs  Positive Male    Rx Drugs  Negative      Personal History of Substance Abuse   Alcohol  Negative    Illegal Drugs  Positive Male or Male    Rx Drugs  Negative      Age   Age between 44-45 years   No      History of Preadolescent Sexual Abuse   History of Preadolescent Sexual Abuse  Negative or Male      Psychological Disease   Psychological Disease  Positive anxiety   anxiety   Depression  Positive      Total Score   Opioid Risk Tool Scoring  13    Opioid Risk Interpretation  High Risk      ORT Scoring interpretation table:  Score <3 = Low Risk for SUD  Score between 4-7 = Moderate Risk for SUD  Score >8 = High Risk for Opioid Abuse   Risk Mitigation Strategies:  Patient Counseling: Covered Patient-Prescriber  Agreement (PPA): Present and active  Notification to other healthcare providers: Done  Pharmacologic Plan: No change in therapy, at this time.             Laboratory  Chemistry  Inflammation Markers (CRP: Acute Phase) (ESR: Chronic Phase) Lab Results  Component Value Date   CRP 24.6 (H) 09/11/2017   ESRSEDRATE 16 09/11/2017   LATICACIDVEN 1.2 02/20/2017                         Rheumatology Markers Lab Results  Component Value Date   ANA Negative 11/01/2016                        Renal Function Markers Lab Results  Component Value Date   BUN 11 09/11/2017   CREATININE 0.98 09/11/2017   GFRAA 99 09/11/2017   GFRNONAA 85 09/11/2017                              Hepatic Function Markers Lab Results  Component Value Date   AST 15 09/11/2017   ALT 31 02/20/2017   ALBUMIN 4.2 09/11/2017   ALKPHOS 71 09/11/2017   HCVAB >11.0 (H) 11/01/2016   LIPASE 45 02/20/2017                        Electrolytes Lab Results  Component Value Date   NA 139 09/11/2017   K 4.7 09/11/2017   CL 99 09/11/2017   CALCIUM 9.0 09/11/2017   MG 2.1 09/11/2017                        Neuropathy Markers Lab Results  Component Value Date   VITAMINB12 457 09/11/2017   HGBA1C 5.2 12/28/2016                        Bone Pathology Markers Lab Results  Component Value Date   25OHVITD1 35 09/11/2017   25OHVITD2 <1.0 09/11/2017   25OHVITD3 35 09/11/2017                         Coagulation Parameters Lab Results  Component Value Date   INR 1.02 11/01/2016   LABPROT 13.4 11/01/2016   PLT 249 08/30/2017                        Cardiovascular Markers Lab Results  Component Value Date   BNP 12.0 12/26/2016   TROPONINI <0.03 02/20/2017   HGB 15.4 08/30/2017   HCT 44.9 08/30/2017                         Note: Lab results reviewed.  Recent Diagnostic Imaging Review  Cervical Imaging: Cervical DG complete:  Results for orders placed during the hospital encounter of 10/27/17  DG Cervical Spine Complete   Narrative CLINICAL DATA:  Fall  EXAM: CERVICAL SPINE - COMPLETE 4+ VIEW  COMPARISON:  09/19/2017  FINDINGS: Dens and lateral masses are within  normal limits. Cervical alignment within normal limits. Moderate degenerative changes at C5-C6 and C6-C7. Bilateral foraminal narrowing at these levels. Prevertebral soft tissue thickness is normal  IMPRESSION: No radiographic evidence for acute osseous abnormality. Moderate degenerative changes at C5-C6 and C6-C7   Electronically Signed   By: Maudie Mercury  Francoise Ceo M.D.   On: 10/27/2017 03:22    Shoulder Imaging: Shoulder-R DG:  Results for orders placed in visit on 09/18/17  DG Shoulder Right   Narrative CLINICAL DATA:  57 year old male with chronic right shoulder pain. Initial encounter.  EXAM: RIGHT SHOULDER - 2+ VIEW  COMPARISON:  06/05/2014.  FINDINGS: Acromioclavicular joint degenerative changes with bony overgrowth superior margin.  No abnormal soft tissue calcifications.  No fracture or dislocation.  Visualized lungs clear.  Remote right third rib fracture.  IMPRESSION: Acromioclavicular joint degenerative changes with bony overgrowth superior margin.   Electronically Signed   By: Genia Del M.D.   On: 09/19/2017 14:44    Lumbosacral Imaging: Lumbar DG 2-3 views:  Results for orders placed during the hospital encounter of 08/14/17  DG Lumbar Spine 2-3 Views   Narrative CLINICAL DATA:  Low back pain extending into right groin.  EXAM: LUMBAR SPINE - 2-3 VIEW  COMPARISON:  01/19/2017  FINDINGS: Patient is status post fusion from L4-S1 posteriorly. No hardware complicating feature. Normal alignment. Limbus vertebra noted at L3, stable, a normal variant. No acute bony abnormality. No fracture or malalignment. Disc spaces maintained. SI joints are symmetric and unremarkable.  IMPRESSION: Stable postoperative changes of posterior fusion from L4-S1.  No acute bony abnormality.   Electronically Signed   By: Rolm Baptise M.D.   On: 08/14/2017 12:01    Lumbar DG (Complete) 4+V:  Results for orders placed during the hospital encounter of 10/27/17   DG Lumbar Spine Complete   Narrative CLINICAL DATA:  Fall  EXAM: LUMBAR SPINE - COMPLETE 4+ VIEW  COMPARISON:  08/14/2017, 01/19/2017  FINDINGS: Stable lumbar alignment. Status post posterior rod and fixating screws L4 through S1 with interbody devices at L4-L5 and L5-S1. Chronic superior and anterior endplate deformity at L3 with limbus vertebra. Mild degenerative changes at L1-L2. Vertebral body heights are maintained.  IMPRESSION: 1. No acute osseous abnormality 2. Postsurgical changes L4 through S1.   Electronically Signed   By: Donavan Foil M.D.   On: 10/27/2017 03:25    Knee Imaging: Knee-R MR wo contrast:  Results for orders placed during the hospital encounter of 08/02/17  MR KNEE RIGHT WO CONTRAST   Narrative CLINICAL DATA:  Injured knee 2 months ago. Persistent pain and swelling.  EXAM: MRI OF THE RIGHT KNEE WITHOUT CONTRAST  TECHNIQUE: Multiplanar, multisequence MR imaging of the knee was performed. No intravenous contrast was administered.  COMPARISON:  Radiographs 03/04/2017  FINDINGS: MENISCI  Medial meniscus: Complex flap type tear involving the posterior horn and mid body junction region with flipped meniscal fragment between the medial femoral condyle and medial capsule. Associated adjacent synovitis.  Lateral meniscus: Horizontal cleavage-type tear involving the anterior horn and mid body junction region.  LIGAMENTS  Cruciates:  Intact.  Mild mucoid degeneration of the ACL.  Collaterals:  Intact.  MCL and pes anserine bursitis.  CARTILAGE  Patellofemoral:  Mild to moderate degenerative chondrosis.  Medial: Advanced degenerative chondrosis with areas of full or near full-thickness cartilage loss, joint space narrowing, early spurring and subchondral edema.  Lateral: Moderate degenerative chondrosis with early joint space narrowing and spurring.  Joint: Large joint effusion and moderate synovitis. Small loose ossified body in  the posterior joint space, posterior to the PCL.  Popliteal Fossa: No popliteal mass or Baker's cyst. Mild semimembranosus bursitis.  Extensor Mechanism: The patella retinacular structures are intact and the quadriceps and patellar tendons are intact.  Bones: No acute bony findings. Patchy areas of marrow edema  likely stress related.  Other: The knee musculature is grossly normal.  IMPRESSION: 1. Medial and lateral meniscal tears as described above. 2. Intact ligamentous structures and no acute bony findings. Significant tricompartmental degenerative changes. 3. Large joint effusion and moderate synovitis. Small loose ossified body noted in the posterior joint space.   Electronically Signed   By: Marijo Sanes M.D.   On: 08/02/2017 09:46    Knee-L DG 1-2 views:  Results for orders placed in visit on 09/18/17  DG Knee 1-2 Views Left   Narrative CLINICAL DATA:  57 year old male with chronic knee pain. Initial encounter.  EXAM: LEFT KNEE - 1-2 VIEW  COMPARISON:  None.  FINDINGS: Minimal spurring posterosuperior patella may represent minimal patellofemoral joint degenerative changes.  No significant tibiofemoral joint space narrowing.  Questionable tiny suprapatellar joint effusion.  No fracture or dislocation.  IMPRESSION: Minimal patellofemoral joint degenerative changes.   Electronically Signed   By: Genia Del M.D.   On: 09/19/2017 15:45    Knee-R DG 4 views:  Results for orders placed during the hospital encounter of 10/27/17  DG Knee Complete 4 Views Right   Narrative CLINICAL DATA:  Fall  EXAM: RIGHT KNEE - COMPLETE 4+ VIEW  COMPARISON:  03/04/2017  FINDINGS: No acute displaced fracture or malalignment. Mild patellofemoral degenerative change. Mild to moderate medial compartment degenerative change and mild lateral compartment degenerative change. Small to moderate knee effusion.  IMPRESSION: 1. No acute osseous abnormality. 2. Knee  effusion   Electronically Signed   By: Donavan Foil M.D.   On: 10/27/2017 03:29    Ankle Imaging: Ankle-L DG Complete:  Results for orders placed during the hospital encounter of 06/19/16  DG Ankle Complete Left   Narrative CLINICAL DATA:  Left ankle pain after falling off ladder.  EXAM: LEFT ANKLE COMPLETE - 3+ VIEW  COMPARISON:  None.  FINDINGS: There is no evidence of fracture, dislocation, or joint effusion. There is no evidence of arthropathy or other focal bone abnormality. Soft tissues are unremarkable.  IMPRESSION: Normal left ankle.   Electronically Signed   By: Marijo Conception, M.D.   On: 06/19/2016 20:49    Foot Imaging: Foot-R DG Complete:  Results for orders placed during the hospital encounter of 01/30/14  DG Foot Complete Right   Narrative CLINICAL DATA:  Right foot pain  EXAM: RIGHT FOOT COMPLETE - 3+ VIEW  COMPARISON:  None.  FINDINGS: Mild degenerative changes are noted at the first interphalangeal joint. No acute fracture or dislocation is seen. No soft tissue changes are noted.  IMPRESSION: Degenerative change without acute abnormality.   Electronically Signed   By: Inez Catalina M.D.   On: 01/30/2014 20:23    Foot-L DG Complete:  Results for orders placed during the hospital encounter of 06/19/16  DG Foot Complete Left   Narrative CLINICAL DATA:  Fifth metatarsal pain. Fell from ladder hanging lights.  EXAM: LEFT FOOT - COMPLETE 3+ VIEW  COMPARISON:  None.  FINDINGS: No acute bone or soft tissue abnormality is present. There is no significant soft tissue slight lateral foot. Fifth metatarsal is intact. Go  IMPRESSION: Negative left foot radiographs.   Electronically Signed   By: San Morelle M.D.   On: 06/19/2016 20:47    Complexity Note: Imaging results reviewed. Results shared with Keith Gibbs, using Layman's terms.                         Meds   Current  Outpatient Medications:  .  acetaminophen  (TYLENOL) 500 MG tablet, Take 2 tablets (1,000 mg total) by mouth every 6 (six) hours as needed. For pain., Disp: 90 tablet, Rfl: 1 .  albuterol (PROAIR HFA) 108 (90 Base) MCG/ACT inhaler, Inhale 2 puffs into the lungs every 6 (six) hours as needed for wheezing or shortness of breath., Disp: 1 Inhaler, Rfl: 1 .  aspirin EC 81 MG tablet, Take 1 tablet (81 mg total) by mouth daily., Disp: 30 tablet, Rfl: 1 .  atorvastatin (LIPITOR) 40 MG tablet, Take 1 tablet (40 mg total) by mouth daily at 6 PM., Disp: 30 tablet, Rfl: 1 .  buPROPion (WELLBUTRIN SR) 100 MG 12 hr tablet, Take 100 mg by mouth 2 (two) times daily., Disp: , Rfl:  .  cetirizine (ZYRTEC) 10 MG tablet, Take 10 mg by mouth daily., Disp: , Rfl:  .  divalproex (DEPAKOTE) 500 MG DR tablet, Take 500 mg by mouth 2 (two) times daily., Disp: , Rfl: 0 .  DULERA 200-5 MCG/ACT AERO, TAKE 2 PUFFS BY MOUTH TWICE A DAY, Disp: 1 Inhaler, Rfl: 1 .  isosorbide mononitrate (IMDUR) 30 MG 24 hr tablet, Take 1 tablet (30 mg total) by mouth daily., Disp: 30 tablet, Rfl: 1 .  Melatonin 3 MG TABS, Take 3 mg by mouth at bedtime., Disp: , Rfl:  .  meloxicam (MOBIC) 7.5 MG tablet, Take 7.5 mg by mouth daily., Disp: , Rfl:  .  nitroGLYCERIN (NITROSTAT) 0.4 MG SL tablet, Place 1 tablet (0.4 mg total) under the tongue every 5 (five) minutes x 3 doses as needed for chest pain., Disp: 30 tablet, Rfl: 0 .  omeprazole (PRILOSEC) 40 MG capsule, Take 40 mg by mouth daily., Disp: , Rfl:  .  prazosin (MINIPRESS) 1 MG capsule, Take 1 capsule (1 mg total) by mouth at bedtime., Disp: 30 capsule, Rfl: 1 .  ranolazine (RANEXA) 500 MG 12 hr tablet, Take 500 mg by mouth 2 (two) times daily., Disp: , Rfl:  .  rOPINIRole (REQUIP) 0.5 MG tablet, TAKE 1 TO 2 TABLETS BY MOUTH TWICE DAILY AS NEEDED FOR RESTLESS LEGS., Disp: , Rfl: 1 .  tamsulosin (FLOMAX) 0.4 MG CAPS capsule, Take 1 capsule (0.4 mg total) by mouth daily., Disp: 30 capsule, Rfl: 1 .  ticagrelor (BRILINTA) 60 MG TABS tablet,  Take 60 mg by mouth daily., Disp: , Rfl:  .  ticagrelor (BRILINTA) 90 MG TABS tablet, Take 1 tablet (90 mg total) by mouth 2 (two) times daily., Disp: 60 tablet, Rfl: 0 .  cyclobenzaprine (FLEXERIL) 10 MG tablet, Take 1 tablet (10 mg total) by mouth 3 (three) times daily as needed for muscle spasms., Disp: 90 tablet, Rfl: 0 .  HYDROcodone-acetaminophen (NORCO) 5-325 MG tablet, Take 1 tablet by mouth every 6 (six) hours as needed for severe pain. Max.: 4/day. Must Last 30 days., Disp: 120 tablet, Rfl: 0  ROS  Constitutional: Denies any fever or chills Gastrointestinal: No reported hemesis, hematochezia, vomiting, or acute GI distress Musculoskeletal: Denies any acute onset joint swelling, redness, loss of ROM, or weakness Neurological: No reported episodes of acute onset apraxia, aphasia, dysarthria, agnosia, amnesia, paralysis, loss of coordination, or loss of consciousness  Allergies  Keith Gibbs is allergic to latuda [lurasidone hcl]; naproxen; and saphris [asenapine].  [place]  Drug: Keith Gibbs  reports that he does not use drugs. Alcohol:  reports that he drinks alcohol. Tobacco:  reports that he has been smoking cigarettes.  He has been smoking about 0.50 packs  per day. He has never used smokeless tobacco. Medical:  has a past medical history of Anxiety, Arthritis, Bursitis, COPD (chronic obstructive pulmonary disease) ([place]), Depression, GERD (gastroesophageal reflux disease), Hepatitis C (2012), Hypertension, Lung disease, MI (myocardial infarction) ([place]), and Stroke ([place]). Surgical: Keith Gibbs  has a past surgical history that includes Appendectomy; Shoulder surgery (Right, 04/09/2012); Back surgery; Abdominal surgery; Colonoscopy; Cardiac catheterization (Left, 06/10/2016); Cardiac catheterization (N/A, 06/10/2016); Esophagogastroduodenoscopy (egd) with propofol (N/A, 01/05/2017); Colonoscopy with propofol (N/A, 01/05/2017); Coronary angioplasty with stent; and Knee arthroscopy with medial  menisectomy (Right, 09/05/2017). Family: family history includes Early death in his father; Heart disease in his father and mother; Heart failure in his father; Hypertension in his father, mother, and sister; Osteoarthritis in his mother.  Constitutional Exam  General appearance: Well nourished, well developed, and well hydrated. In no apparent acute distress Vitals:   11/01/17 0812  BP: (!) 143/90  Pulse: (!) 53  Temp: 98.4 F (36.9 C)  SpO2: 100%  Weight: 202 lb (91.6 kg)  Height: 6' 3"  (1.905 m)   BMI Assessment: Estimated body mass index is 25.25 kg/m as calculated from the following:   Height as of this encounter: 6' 3"  (1.905 m).   Weight as of this encounter: 202 lb (91.6 kg).  BMI interpretation table: BMI level Category Range association with higher incidence of chronic pain  <18 kg/m2 Underweight   18.5-24.9 kg/m2 Ideal body weight   25-29.9 kg/m2 Overweight Increased incidence by 20%  30-34.9 kg/m2 Obese (Class I) Increased incidence by 68%  35-39.9 kg/m2 Severe obesity (Class II) Increased incidence by 136%  >40 kg/m2 Extreme obesity (Class III) Increased incidence by 254%   BMI Readings from Last 4 Encounters:  11/01/17 25.25 kg/m  10/30/17 26.00 kg/m  10/27/17 26.87 kg/m  10/09/17 26.87 kg/m   Wt Readings from Last 4 Encounters:  11/01/17 202 lb (91.6 kg)  10/30/17 208 lb (94.3 kg)  10/27/17 215 lb (97.5 kg)  10/09/17 215 lb (97.5 kg)  Psych/Mental status: Alert, oriented x 3 (person, place, & time)       Eyes: PERLA Respiratory: No evidence of acute respiratory distress  Cervical Spine Area Exam  Skin & Axial Inspection: No masses, redness, edema, swelling, or associated skin lesions Alignment: Symmetrical Functional ROM: Unrestricted ROM      Stability: No instability detected Muscle Tone/Strength: Functionally intact. No obvious neuro-muscular anomalies detected. Sensory (Neurological): Unimpaired Palpation: No palpable anomalies               Upper Extremity (UE) Exam    Side: Right upper extremity  Side: Left upper extremity  Skin & Extremity Inspection: Skin color, temperature, and hair growth are WNL. No peripheral edema or cyanosis. No masses, redness, swelling, asymmetry, or associated skin lesions. No contractures.  Skin & Extremity Inspection: Skin color, temperature, and hair growth are WNL. No peripheral edema or cyanosis. No masses, redness, swelling, asymmetry, or associated skin lesions. No contractures.  Functional ROM: Unrestricted ROM          Functional ROM: Unrestricted ROM          Muscle Tone/Strength: Functionally intact. No obvious neuro-muscular anomalies detected.  Muscle Tone/Strength: Functionally intact. No obvious neuro-muscular anomalies detected.  Sensory (Neurological): Unimpaired          Sensory (Neurological): Unimpaired          Palpation: No palpable anomalies              Palpation: No palpable anomalies  Specialized Test(s): Deferred         Specialized Test(s): Deferred          Thoracic Spine Area Exam  Skin & Axial Inspection: No masses, redness, or swelling Alignment: Symmetrical Functional ROM: Unrestricted ROM Stability: No instability detected Muscle Tone/Strength: Functionally intact. No obvious neuro-muscular anomalies detected. Sensory (Neurological): Unimpaired Muscle strength & Tone: No palpable anomalies  Lumbar Spine Area Exam  Skin & Axial Inspection: No masses, redness, or swelling Alignment: Symmetrical Functional ROM: Unrestricted ROM      Stability: No instability detected Muscle Tone/Strength: Functionally intact. No obvious neuro-muscular anomalies detected. Sensory (Neurological): Unimpaired Palpation: No palpable anomalies       Provocative Tests: Lumbar Hyperextension and rotation test: evaluation deferred today       Lumbar Lateral bending test: evaluation deferred today       Patrick's Maneuver: evaluation deferred today                    Gait &  Posture Assessment  Ambulation: Limited Gait: Antalgic Posture: WNL   Lower Extremity Exam    Side: Right lower extremity  Side: Left lower extremity  Skin & Extremity Inspection: Skin color, temperature, and hair growth are WNL. No peripheral edema or cyanosis. No masses, redness, swelling, asymmetry, or associated skin lesions. No contractures.  Skin & Extremity Inspection: Skin color, temperature, and hair growth are WNL. No peripheral edema or cyanosis. No masses, redness, swelling, asymmetry, or associated skin lesions. No contractures.  Functional ROM: Unrestricted ROM          Functional ROM: Unrestricted ROM          Muscle Tone/Strength: Functionally intact. No obvious neuro-muscular anomalies detected.  Muscle Tone/Strength: Functionally intact. No obvious neuro-muscular anomalies detected.  Sensory (Neurological): Unimpaired  Sensory (Neurological): Unimpaired  Palpation: No palpable anomalies  Palpation: No palpable anomalies   Assessment  Primary Diagnosis & Pertinent Problem List: The primary encounter diagnosis was Personal history of noncompliance with medical treatment, presenting hazards to health. 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  Status Diagnosis  Worsening Unimproved Stable 1. Personal history of noncompliance with medical treatment, presenting hazards to health   2. Chronic knee pain (Primary Area of Pain)  (Bilateral) (R>L)   3. 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 Chronic low back pain (Secondary Area of Pain) (Bilateral)   5. DDD (degenerative disc disease), lumbar   6. Lumbar facet syndrome (Bilateral)   7. Spondylosis without myelopathy or radiculopathy, lumbosacral region   8. Chronic hip pain (Tertiary Area of Pain) (Bilateral) (R>L)   9. 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 Chronic shoulder pain (Fourth Area of Pain) (Right)  11. Osteoarthritis of shoulder (Right)   12. Osteoarthritis of acromioclavicular joint (Right)   13. 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 Chronic neck pain (Fifth Area of Pain) (Midline)   15. Cervicalgia   16. Cervical facet syndrome   17. DDD (degenerative disc disease), cervical   18. Spondylosis without myelopathy or radiculopathy, cervical region   19. Osteoarthritis   20. Chronic musculoskeletal pain   21. Chronic pain syndrome   22. Alcohol use disorder, moderate, dependence ([place])   23. Cannabis use disorder, moderate, dependence ([place])   24. Cocaine use disorder, moderate, dependence ([place])   25. 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 cyclobenzaprine (FLEXERIL) 10 MG tablet    Sig: Take 1 tablet (10 mg total) by mouth 3 (three) times daily as needed for muscle spasms.    Dispense:  90 tablet    Refill:  0    Do not place medication on "Automatic Refill". Fill one day early if pharmacy is closed on scheduled refill date.  Marland Kitchen HYDROcodone-acetaminophen (NORCO) 5-325 MG tablet    Sig: Take 1 tablet by mouth every 6 (six) hours as needed for severe pain. Max.: 4/day. Must Last 30 days.    Dispense:  120 tablet    Refill:  0    Fill one day early if pharmacy is closed on scheduled refill date. Do not fill until: 11/01/17 To last until: 12/01/17   Medications administered today: Keith Gibbs had no medications administered during this visit.   Procedure Orders     KNEE INJECTION Lab Orders  No laboratory test(s) ordered today   Imaging Orders  No imaging studies ordered today   Referral Orders  No referral(s) requested today    Interventional management options: Planned, scheduled, and/or pending:   Diagnostic leftintra-articular knee injectionwith local anesthetic and steroid    Considering:   Diagnostic left intra-articular knee injection Diagnostic bilateral genicular nerve block Possiblebilateral genicular nerve RFA Diagnostic lumbar facet nerve block Possible bilateral lumbar facet RFA Diagnostic bilateral hip injections Diagnosticright suprascapular nerve block Possible right suprascapular nerve RFA Diagnosticbilateral cervical facet nerve  block Possible bilateral cervical RFA   Palliative PRN treatment(s):   None at this time   Provider-requested follow-up: Return for Procedure (no sedation): (L) IA Knee inj (Steroid).  Future Appointments  Date Time Provider [place]  11/02/2017  7:45 AM Milinda Pointer, MD [facility] None   Primary Care Physician: Jodi Marble, MD Location: [facility] Note by: Gaspar Cola, MD Date: 11/01/2017; Time: 9:49 AM

## 2017-11-01 NOTE — Patient Instructions (Addendum)
_____You have been given a script for Hydrocodone today and you have Flexeril to be picked up from your pharmacy.  You have been given pre procedure instructions for procedure without sedation.  ______________________________________________________________________________________  Preparing for your procedure (without sedation)  Instructions: . Oral Intake: Do not eat or drink anything for at least 3 hours prior to your procedure. . Transportation: Unless otherwise stated by your physician, you may drive yourself after the procedure. . Blood Pressure Medicine: Take your blood pressure medicine with a sip of water the morning of the procedure. . Blood thinners:  . Diabetics on insulin: Notify the staff so that you can be scheduled 1st case in the morning. If your diabetes requires high dose insulin, take only  of your normal insulin dose the morning of the procedure and notify the staff that you have done so. . Preventing infections: Shower with an antibacterial soap the morning of your procedure.  . Build-up your immune system: Take 1000 mg of Vitamin C with every meal (3 times a day) the day prior to your procedure. Marland Kitchen Antibiotics: Inform the staff if you have a condition or reason that requires you to take antibiotics before dental procedures. . Pregnancy: If you are pregnant, call and cancel the procedure. . Sickness: If you have a cold, fever, or any active infections, call and cancel the procedure. . Arrival: You must be in the facility at least 30 minutes prior to your scheduled procedure. . Children: Do not bring any children with you. . Dress appropriately: Bring dark clothing that you would not mind if they get stained. . Valuables: Do not bring any jewelry or valuables.  Procedure appointments are reserved for interventional treatments only. Marland Kitchen No Prescription Refills. . No medication changes will be discussed during procedure appointments. . No disability issues will be  discussed.  Remember:  Regular Business hours are:  Monday to Thursday 8:00 AM to 4:00 PM  Provider's Schedule: Milinda Pointer, MD:  Procedure days: Tuesday and Thursday 7:30 AM to 4:00 PM  Gillis Santa, MD:  Procedure days: Monday and Wednesday 7:30 AM to 4:00 PM ____________________________________________________________________________________________

## 2017-11-02 ENCOUNTER — Other Ambulatory Visit: Payer: Self-pay

## 2017-11-02 ENCOUNTER — Encounter: Payer: Self-pay | Admitting: Pain Medicine

## 2017-11-02 ENCOUNTER — Ambulatory Visit: Payer: Medicaid Other | Attending: Pain Medicine | Admitting: Pain Medicine

## 2017-11-02 VITALS — BP 115/74 | HR 52 | Temp 98.0°F | Resp 18 | Ht 75.0 in | Wt 208.0 lb

## 2017-11-02 DIAGNOSIS — G8929 Other chronic pain: Secondary | ICD-10-CM | POA: Insufficient documentation

## 2017-11-02 DIAGNOSIS — M25562 Pain in left knee: Secondary | ICD-10-CM | POA: Diagnosis not present

## 2017-11-02 DIAGNOSIS — M25561 Pain in right knee: Secondary | ICD-10-CM | POA: Diagnosis present

## 2017-11-02 DIAGNOSIS — M1712 Unilateral primary osteoarthritis, left knee: Secondary | ICD-10-CM | POA: Diagnosis not present

## 2017-11-02 MED ORDER — ROPIVACAINE HCL 2 MG/ML IJ SOLN
2.0000 mL | Freq: Once | INTRAMUSCULAR | Status: AC
Start: 2017-11-02 — End: 2017-11-02
  Administered 2017-11-02: 10 mL via INTRA_ARTICULAR
  Filled 2017-11-02: qty 10

## 2017-11-02 MED ORDER — METHYLPREDNISOLONE ACETATE 80 MG/ML IJ SUSP
80.0000 mg | Freq: Once | INTRAMUSCULAR | Status: AC
  Administered 2017-11-02: 80 mg via INTRA_ARTICULAR
  Filled 2017-11-02: qty 1

## 2017-11-02 MED ORDER — LIDOCAINE HCL (PF) 1 % IJ SOLN
5.0000 mL | Freq: Once | INTRAMUSCULAR | Status: AC
  Administered 2017-11-02: 5 mL
  Filled 2017-11-02: qty 5

## 2017-11-02 NOTE — Patient Instructions (Addendum)
____________________________________________________________________________________________  Post-Procedure Discharge Instructions  Instructions:  Apply ice: Fill a plastic sandwich bag with crushed ice. Cover it with a small towel and apply to injection site. Apply for 15 minutes then remove x 15 minutes. Repeat sequence on day of procedure, until you go to bed. The purpose is to minimize swelling and discomfort after procedure.  Apply heat: Apply heat to procedure site starting the day following the procedure. The purpose is to treat any soreness and discomfort from the procedure.  Food intake: Start with clear liquids (like water) and advance to regular food, as tolerated.   Physical activities: Keep activities to a minimum for the first 8 hours after the procedure.   Driving: If you have received any sedation, you are not allowed to drive for 24 hours after your procedure.  Blood thinner: Restart your blood thinner 6 hours after your procedure. (Only for those taking blood thinners)  Insulin: As soon as you can eat, you may resume your normal dosing schedule. (Only for those taking insulin)  Infection prevention: Keep procedure site clean and dry.  Post-procedure Pain Diary: Extremely important that this be done correctly and accurately. Recorded information will be used to determine the next step in treatment.  Pain evaluated is that of treated area only. Do not include pain from an untreated area.  Complete every hour, on the hour, for the initial 8 hours. Set an alarm to help you do this part accurately.  Do not go to sleep and have it completed later. It will not be accurate.  Follow-up appointment: Keep your follow-up appointment after the procedure. Usually 2 weeks for most procedures. (6 weeks in the case of radiofrequency.) Bring you pain diary.   Expect:  From numbing medicine (AKA: Local Anesthetics): Numbness or decrease in pain.  Onset: Full effect within 15  minutes of injected.  Duration: It will depend on the type of local anesthetic used. On the average, 1 to 8 hours.   From steroids: Decrease in swelling or inflammation. Once inflammation is improved, relief of the pain will follow.  Onset of benefits: Depends on the amount of swelling present. The more swelling, the longer it will take for the benefits to be seen. In some cases, up to 10 days.  Duration: Steroids will stay in the system x 2 weeks. Duration of benefits will depend on multiple posibilities including persistent irritating factors.  From procedure: Some discomfort is to be expected once the numbing medicine wears off. This should be minimal if ice and heat are applied as instructed.  Call if:  You experience numbness and weakness that gets worse with time, as opposed to wearing off.  New onset bowel or bladder incontinence. (This applies to Spinal procedures only)  Emergency Numbers:  Durning business hours (Monday - Thursday, 8:00 AM - 4:00 PM) (Friday, 9:00 AM - 12:00 Noon): [phone]  After hours: [phone] ____________________________________________________________________________________________   ____________________________________________________________________________________________  Pain Scale  Introduction: The pain score used by this practice is the Verbal Numerical Rating Scale (VNRS-11). This is an 11-point scale. It is for adults and children 10 years or older. There are significant differences in how the pain score is reported, used, and applied. Forget everything you learned in the past and learn this scoring system.  General Information: The scale should reflect your current level of pain. Unless you are specifically asked for the level of your worst pain, or your average pain. If you are asked for one of these two, then  it should be understood that it is over the past 24 hours.  Basic Activities of Daily Living (ADL): Personal hygiene,  dressing, eating, transferring, and using restroom.  Instructions: Most patients tend to report their level of pain as a combination of two factors, their physical pain and their psychosocial pain. This last one is also known as "suffering" and it is reflection of how physical pain affects you socially and psychologically. From now on, report them separately. From this point on, when asked to report your pain level, report only your physical pain. Use the following table for reference.  Pain Clinic Pain Levels (0-5/10)  Pain Level Score  Description  No Pain 0   Mild pain 1 Nagging, annoying, but does not interfere with basic activities of daily living (ADL). Patients are able to eat, bathe, get dressed, toileting (being able to get on and off the toilet and perform personal hygiene functions), transfer (move in and out of bed or a chair without assistance), and maintain continence (able to control bladder and bowel functions). Blood pressure and heart rate are unaffected. A normal heart rate for a healthy adult ranges from 60 to 100 bpm (beats per minute).   Mild to moderate pain 2 Noticeable and distracting. Impossible to hide from other people. More frequent flare-ups. Still possible to adapt and function close to normal. It can be very annoying and may have occasional stronger flare-ups. With discipline, patients may get used to it and adapt.   Moderate pain 3 Interferes significantly with activities of daily living (ADL). It becomes difficult to feed, bathe, get dressed, get on and off the toilet or to perform personal hygiene functions. Difficult to get in and out of bed or a chair without assistance. Very distracting. With effort, it can be ignored when deeply involved in activities.   Moderately severe pain 4 Impossible to ignore for more than a few minutes. With effort, patients may still be able to manage work or participate in some social activities. Very difficult to concentrate. Signs of  autonomic nervous system discharge are evident: dilated pupils (mydriasis); mild sweating (diaphoresis); sleep interference. Heart rate becomes elevated (>115 bpm). Diastolic blood pressure (lower number) rises above 100 mmHg. Patients find relief in laying down and not moving.   Severe pain 5 Intense and extremely unpleasant. Associated with frowning face and frequent crying. Pain overwhelms the senses.  Ability to do any activity or maintain social relationships becomes significantly limited. Conversation becomes difficult. Pacing back and forth is common, as getting into a comfortable position is nearly impossible. Pain wakes you up from deep sleep. Physical signs will be obvious: pupillary dilation; increased sweating; goosebumps; brisk reflexes; cold, clammy hands and feet; nausea, vomiting or dry heaves; loss of appetite; significant sleep disturbance with inability to fall asleep or to remain asleep. When persistent, significant weight loss is observed due to the complete loss of appetite and sleep deprivation.  Blood pressure and heart rate becomes significantly elevated. Caution: If elevated blood pressure triggers a pounding headache associated with blurred vision, then the patient should immediately seek attention at an urgent or emergency care unit, as these may be signs of an impending stroke.    Emergency Department Pain Levels (6-10/10)  Emergency Room Pain 6 Severely limiting. Requires emergency care and should not be seen or managed at an outpatient pain management facility. Communication becomes difficult and requires great effort. Assistance to reach the emergency department may be required. Facial flushing and profuse sweating along with potentially  increases in heart rate and blood pressure will be evident.   Distressing pain 7 Self-care is very difficult. Assistance is required to transport, or use restroom. Assistance to reach the emergency department will be required. Tasks  requiring coordination, such as bathing and getting dressed become very difficult.   Disabling pain 8 Self-care is no longer possible. At this level, pain is disabling. The individual is unable to do even the most "basic" activities such as walking, eating, bathing, dressing, transferring to a bed, or toileting. Fine motor skills are lost. It is difficult to think clearly.   Incapacitating pain 9 Pain becomes incapacitating. Thought processing is no longer possible. Difficult to remember your own name. Control of movement and coordination are lost.   The worst pain imaginable 10 At this level, most patients pass out from pain. When this level is reached, collapse of the autonomic nervous system occurs, leading to a sudden drop in blood pressure and heart rate. This in turn results in a temporary and dramatic drop in blood flow to the brain, leading to a loss of consciousness. Fainting is one of the body's self defense mechanisms. Passing out puts the brain in a calmed state and causes it to shut down for a while, in order to begin the healing process.    Summary: 1. Refer to this scale when providing us with your pain level. 2. Be accurate and careful when reporting your pain level. This will help with your care. 3. Over-reporting your pain level will lead to loss of credibility. 4. Even a level of 1/10 means that there is pain and will be treated at our facility. 5. High, inaccurate reporting will be documented as "Symptom Exaggeration", leading to loss of credibility and suspicions of possible secondary gains such as obtaining more narcotics, or wanting to appear disabled, for fraudulent reasons. 6. Only pain levels of 5 or below will be seen at our facility. 7. Pain levels of 6 and above will be sent to the Emergency Department and the appointment cancelled. ____________________________________________________________________________________________     

## 2017-11-02 NOTE — Progress Notes (Signed)
Patient's Name: Keith Gibbs  MRN: [id number]  Referring Provider: Jodi Marble, MD  DOB: 10/15/1960  PCP: Jodi Marble, MD  DOS: 11/02/2017  Note by: Gaspar Cola, MD  Service setting: Ambulatory outpatient  Specialty: Interventional Pain Management  Patient type: Established  Location: [facility] (AMB) Pain Management Facility  Visit type: Interventional Procedure   Primary Reason for Visit: Interventional Pain Management Treatment. CC: Knee Pain (inside of left knee)  Procedure:  Anesthesia, Analgesia, Anxiolysis:  Type: Diagnostic Intra-Articular Local anesthetic and steroid Knee Injection          Region: Lateral infrapatellar Knee Region Level: Knee Joint Laterality: Left knee  Type: Local Anesthesia Indication(s): Analgesia         Local Anesthetic: Lidocaine 1-2% Route: Infiltration ([place]/IM) IV Access: Declined Sedation: Declined    Indications: 1. Osteoarthritis of the knee (Left)   2. Chronic knee pain (Primary Area of Pain)  (Bilateral) (R>L)    Pain Score: Pre-procedure: 2 /10 Post-procedure: 0-No pain/10  Pre-op Assessment:  Keith Gibbs is a 57 y.o. (year old), male patient, seen today for interventional treatment. He  has a past surgical history that includes Appendectomy; Shoulder surgery (Right, 04/09/2012); Back surgery; Abdominal surgery; Colonoscopy; Cardiac catheterization (Left, 06/10/2016); Cardiac catheterization (N/A, 06/10/2016); Esophagogastroduodenoscopy (egd) with propofol (N/A, 01/05/2017); Colonoscopy with propofol (N/A, 01/05/2017); Coronary angioplasty with stent; and Knee arthroscopy with medial menisectomy (Right, 09/05/2017). Keith Gibbs has a current medication list which includes the following prescription(s): acetaminophen, albuterol, aspirin ec, atorvastatin, bupropion, cetirizine, cyclobenzaprine, divalproex, dulera, hydrocodone-acetaminophen, isosorbide mononitrate, melatonin, meloxicam, nitroglycerin, omeprazole, prazosin, ranolazine,  ropinirole, tamsulosin, ticagrelor, and ticagrelor. His primarily concern today is the Knee Pain (inside of left knee)  Initial Vital Signs:  Pulse Rate: 60 Temp: 98 F (36.7 C) Resp: 18 BP: 127/81 SpO2: 99 %  BMI: Estimated body mass index is 26 kg/m as calculated from the following:   Height as of this encounter: 6\' 3"  (1.905 m).   Weight as of this encounter: 208 lb (94.3 kg).  Risk Assessment: Allergies: Reviewed. He is allergic to latuda [lurasidone hcl]; naproxen; and saphris [asenapine].  Allergy Precautions: None required Coagulopathies: Reviewed. None identified.  Blood-thinner therapy: None at this time Active Infection(s): Reviewed. None identified. Keith Gibbs is afebrile  Site Confirmation: Keith Gibbs was asked to confirm the procedure and laterality before marking the site Procedure checklist: Completed Consent: Before the procedure and under the influence of no sedative(s), amnesic(s), or anxiolytics, the patient was informed of the treatment options, risks and possible complications. To fulfill our ethical and legal obligations, as recommended by the American Medical Association's Code of Ethics, I have informed the patient of my clinical impression; the nature and purpose of the treatment or procedure; the risks, benefits, and possible complications of the intervention; the alternatives, including doing nothing; the risk(s) and benefit(s) of the alternative treatment(s) or procedure(s); and the risk(s) and benefit(s) of doing nothing. The patient was provided information about the general risks and possible complications associated with the procedure. These may include, but are not limited to: failure to achieve desired goals, infection, bleeding, organ or nerve damage, allergic reactions, paralysis, and death. In addition, the patient was informed of those risks and complications associated to the procedure, such as failure to decrease pain; infection; bleeding; organ or  nerve damage with subsequent damage to sensory, motor, and/or autonomic systems, resulting in permanent pain, numbness, and/or weakness of one or several areas of the body; allergic reactions; (i.e.: anaphylactic reaction); and/or death.  Furthermore, the patient was informed of those risks and complications associated with the medications. These include, but are not limited to: allergic reactions (i.e.: anaphylactic or anaphylactoid reaction(s)); adrenal axis suppression; blood sugar elevation that in diabetics may result in ketoacidosis or comma; water retention that in patients with history of congestive heart failure may result in shortness of breath, pulmonary edema, and decompensation with resultant heart failure; weight gain; swelling or edema; medication-induced neural toxicity; particulate matter embolism and blood vessel occlusion with resultant organ, and/or nervous system infarction; and/or aseptic necrosis of one or more joints. Finally, the patient was informed that Medicine is not an exact science; therefore, there is also the possibility of unforeseen or unpredictable risks and/or possible complications that may result in a catastrophic outcome. The patient indicated having understood very clearly. We have given the patient no guarantees and we have made no promises. Enough time was given to the patient to ask questions, all of which were answered to the patient's satisfaction. Keith Gibbs has indicated that he wanted to continue with the procedure. Attestation: I, the ordering provider, attest that I have discussed with the patient the benefits, risks, side-effects, alternatives, likelihood of achieving goals, and potential problems during recovery for the procedure that I have provided informed consent. Date  Time: 11/02/2017  7:53 AM  Pre-Procedure Preparation:  Monitoring: As per clinic protocol. Respiration, ETCO2, SpO2, BP, heart rate and rhythm monitor placed and checked for adequate  function Safety Precautions: Patient was assessed for positional comfort and pressure points before starting the procedure. Time-out: I initiated and conducted the "Time-out" before starting the procedure, as per protocol. The patient was asked to participate by confirming the accuracy of the "Time Out" information. Verification of the correct person, site, and procedure were performed and confirmed by me, the nursing staff, and the patient. "Time-out" conducted as per Joint Commission's Universal Protocol (UP.01.01.01). Time: 0811  Description of Procedure:       Position: Sitting Target Area: Knee Joint Approach: Just above the Lateral tibial plateau, lateral to the infrapatellar tendon. Area Prepped: Entire knee area, from the mid-thigh to the mid-shin. Prepping solution: ChloraPrep (2% chlorhexidine gluconate and 70% isopropyl alcohol) Safety Precautions: Aspiration looking for blood return was conducted prior to all injections. At no point did we inject any substances, as a needle was being advanced. No attempts were made at seeking any paresthesias. Safe injection practices and needle disposal techniques used. Medications properly checked for expiration dates. SDV (single dose vial) medications used. Description of the Procedure: Protocol guidelines were followed. The patient was placed in position over the fluoroscopy table. The target area was identified and the area prepped in the usual manner. Skin desensitized using vapocoolant spray. Skin & deeper tissues infiltrated with local anesthetic. Appropriate amount of time allowed to pass for local anesthetics to take effect. The procedure needles were then advanced to the target area. Proper needle placement secured. Negative aspiration confirmed. Solution injected in intermittent fashion, asking for systemic symptoms every 0.5cc of injectate. The needles were then removed and the area cleansed, making sure to leave some of the prepping solution  back to take advantage of its long term bactericidal properties. Vitals:   11/02/17 0753 11/02/17 0808 11/02/17 0812  BP: 127/81 119/71 115/74  Pulse: 60 (!) 50 (!) 52  Resp: 18 18 18   Temp: 98 F (36.7 C)    SpO2: 99% 100% 100%  Weight: 208 lb (94.3 kg)    Height: 6\' 3"  (1.905 m)  Start Time: 0811 hrs. End Time: 0811 hrs. Materials:  Needle(s) Type: Regular needle Gauge: 22G Length: 3.5-in Medication(s): Please see orders for medications and dosing details.  Imaging Guidance:  Type of Imaging Technique: None used Indication(s): N/A Exposure Time: No patient exposure Contrast: None used. Fluoroscopic Guidance: N/A Ultrasound Guidance: N/A Interpretation: N/A  Antibiotic Prophylaxis:   Anti-infectives (From admission, onward)   None     Indication(s): None identified  Post-operative Assessment:  Post-procedure Vital Signs:  Pulse Rate: (!) 52 Temp: 98 F (36.7 C) Resp: 18 BP: 115/74 SpO2: 100 %  EBL: None  Complications: No immediate post-treatment complications observed by team, or reported by patient.  Note: The patient tolerated the entire procedure well. A repeat set of vitals were taken after the procedure and the patient was kept under observation following institutional policy, for this type of procedure. Post-procedural neurological assessment was performed, showing return to baseline, prior to discharge. The patient was provided with post-procedure discharge instructions, including a section on how to identify potential problems. Should any problems arise concerning this procedure, the patient was given instructions to immediately contact us, at any time, without hesitation. In any case, we plan to contact the patient by telephone for a follow-up status report regarding this interventional procedure.  Comments:  No additional relevant information.  Plan of Care   Imaging Orders  No imaging studies ordered today    Procedure Orders     KNEE  INJECTION  Medications ordered for procedure: Meds ordered this encounter  Medications  . lidocaine (PF) (XYLOCAINE) 1 % injection 5 mL  . ropivacaine (PF) 2 mg/mL (0.2%) (NAROPIN) injection 2 mL  . methylPREDNISolone acetate (DEPO-MEDROL) injection 80 mg   Medications administered: We administered lidocaine (PF), ropivacaine (PF) 2 mg/mL (0.2%), and methylPREDNISolone acetate.  See the medical record for exact dosing, route, and time of administration.  New Prescriptions   No medications on file   Disposition: Discharge home  Discharge Date & Time: 11/02/2017; 0814 hrs.   Physician-requested Follow-up: Return for post-procedure eval (2 wks), w/ Dr. Dossie Arbour.  Future Appointments  Date Time Provider [place]  11/27/2017  8:15 AM Milinda Pointer, MD [facility] None   Primary Care Physician: Jodi Marble, MD Location: [facility] Outpatient [facility] Management Facility Note by: Gaspar Cola, MD Date: 11/02/2017; Time: 8:18 AM  Disclaimer:  Medicine is not an Chief Strategy Officer. The only guarantee in medicine is that nothing is guaranteed. It is important to note that the decision to proceed with this intervention was based on the information collected from the patient. The Data and conclusions were drawn from the patient's questionnaire, the interview, and the physical examination. Because the information was provided in large part by the patient, it cannot be guaranteed that it has not been purposely or unconsciously manipulated. Every effort has been made to obtain as much relevant data as possible for this evaluation. It is important to note that the conclusions that lead to this procedure are derived in large part from the available data. Always take into account that the treatment will also be dependent on availability of resources and existing treatment guidelines, considered by other Pain Management Practitioners as being common knowledge and practice, at the time of the  intervention. For Medico-Legal purposes, it is also important to point out that variation in procedural techniques and pharmacological choices are the acceptable norm. The indications, contraindications, technique, and results of the above procedure should only be interpreted and judged by a Board-Certified Interventional Pain  Specialist with extensive familiarity and expertise in the same exact procedure and technique.

## 2017-11-02 NOTE — Progress Notes (Signed)
Safety precautions to be maintained throughout the outpatient stay will include: orient to surroundings, keep bed in low position, maintain call bell within reach at all times, provide assistance with transfer out of bed and ambulation.  

## 2017-11-03 ENCOUNTER — Telehealth: Payer: Self-pay

## 2017-11-03 NOTE — Telephone Encounter (Signed)
Post procedure phone call.  Patient states he is doing good.  

## 2017-11-27 ENCOUNTER — Encounter: Payer: Self-pay | Admitting: Pain Medicine

## 2017-11-27 ENCOUNTER — Ambulatory Visit: Payer: Medicaid Other | Attending: Pain Medicine | Admitting: Pain Medicine

## 2017-11-27 ENCOUNTER — Other Ambulatory Visit: Payer: Self-pay

## 2017-11-27 VITALS — BP 155/81 | HR 56 | Temp 97.7°F | Resp 18 | Ht 75.0 in | Wt 225.0 lb

## 2017-11-27 DIAGNOSIS — F419 Anxiety disorder, unspecified: Secondary | ICD-10-CM | POA: Insufficient documentation

## 2017-11-27 DIAGNOSIS — M7918 Myalgia, other site: Secondary | ICD-10-CM | POA: Diagnosis not present

## 2017-11-27 DIAGNOSIS — F209 Schizophrenia, unspecified: Secondary | ICD-10-CM | POA: Diagnosis not present

## 2017-11-27 DIAGNOSIS — Z886 Allergy status to analgesic agent status: Secondary | ICD-10-CM | POA: Diagnosis not present

## 2017-11-27 DIAGNOSIS — Z7982 Long term (current) use of aspirin: Secondary | ICD-10-CM | POA: Insufficient documentation

## 2017-11-27 DIAGNOSIS — Z955 Presence of coronary angioplasty implant and graft: Secondary | ICD-10-CM | POA: Insufficient documentation

## 2017-11-27 DIAGNOSIS — I10 Essential (primary) hypertension: Secondary | ICD-10-CM | POA: Insufficient documentation

## 2017-11-27 DIAGNOSIS — M25552 Pain in left hip: Secondary | ICD-10-CM | POA: Insufficient documentation

## 2017-11-27 DIAGNOSIS — M25551 Pain in right hip: Secondary | ICD-10-CM | POA: Diagnosis not present

## 2017-11-27 DIAGNOSIS — M545 Low back pain, unspecified: Secondary | ICD-10-CM

## 2017-11-27 DIAGNOSIS — K219 Gastro-esophageal reflux disease without esophagitis: Secondary | ICD-10-CM | POA: Insufficient documentation

## 2017-11-27 DIAGNOSIS — F17210 Nicotine dependence, cigarettes, uncomplicated: Secondary | ICD-10-CM | POA: Diagnosis not present

## 2017-11-27 DIAGNOSIS — Z79899 Other long term (current) drug therapy: Secondary | ICD-10-CM | POA: Insufficient documentation

## 2017-11-27 DIAGNOSIS — Z8673 Personal history of transient ischemic attack (TIA), and cerebral infarction without residual deficits: Secondary | ICD-10-CM | POA: Insufficient documentation

## 2017-11-27 DIAGNOSIS — M25562 Pain in left knee: Secondary | ICD-10-CM

## 2017-11-27 DIAGNOSIS — B182 Chronic viral hepatitis C: Secondary | ICD-10-CM | POA: Diagnosis not present

## 2017-11-27 DIAGNOSIS — I2510 Atherosclerotic heart disease of native coronary artery without angina pectoris: Secondary | ICD-10-CM | POA: Insufficient documentation

## 2017-11-27 DIAGNOSIS — G894 Chronic pain syndrome: Secondary | ICD-10-CM | POA: Insufficient documentation

## 2017-11-27 DIAGNOSIS — I252 Old myocardial infarction: Secondary | ICD-10-CM | POA: Diagnosis not present

## 2017-11-27 DIAGNOSIS — M5136 Other intervertebral disc degeneration, lumbar region: Secondary | ICD-10-CM | POA: Diagnosis not present

## 2017-11-27 DIAGNOSIS — M25561 Pain in right knee: Secondary | ICD-10-CM | POA: Diagnosis not present

## 2017-11-27 DIAGNOSIS — Z5181 Encounter for therapeutic drug level monitoring: Secondary | ICD-10-CM | POA: Diagnosis present

## 2017-11-27 DIAGNOSIS — F319 Bipolar disorder, unspecified: Secondary | ICD-10-CM | POA: Diagnosis not present

## 2017-11-27 DIAGNOSIS — M170 Bilateral primary osteoarthritis of knee: Secondary | ICD-10-CM | POA: Insufficient documentation

## 2017-11-27 DIAGNOSIS — G8929 Other chronic pain: Secondary | ICD-10-CM

## 2017-11-27 DIAGNOSIS — F1411 Cocaine abuse, in remission: Secondary | ICD-10-CM | POA: Diagnosis not present

## 2017-11-27 DIAGNOSIS — J449 Chronic obstructive pulmonary disease, unspecified: Secondary | ICD-10-CM | POA: Insufficient documentation

## 2017-11-27 NOTE — Progress Notes (Signed)
Patient's Name: Keith Gibbs  MRN: [id number]  Referring Provider: Jodi Marble, MD  DOB: 1961-06-28  PCP: Keith Marble, MD  DOS: 11/27/2017  Note by: Gaspar Cola, MD  Service setting: Ambulatory outpatient  Specialty: Interventional Pain Management  Location: [facility] (AMB) Pain Management Facility    Patient type: Established   Primary Reason(s) for Visit: Encounter for prescription drug management & post-procedure evaluation of chronic illness with mild to moderate exacerbation(Level of risk: moderate) CC: Knee Pain (left and right) and Back Pain (low)  HPI  Keith Gibbs is a 57 y.o. year old, male patient, who comes today for a post-procedure evaluation and medication management. He has Schizophrenia (Keith Gibbs); COPD (chronic obstructive pulmonary disease) (Keith Gibbs); Chronic hepatitis C (Keith Gibbs); GERD (gastroesophageal reflux disease); Swollen testicle; Abnormal ejaculation; Chest pain, unspecified; Bipolar 1 disorder (Keith Gibbs); Cocaine abuse in remission (Keith Gibbs); Chest pain; Angina at rest Keith Gibbs); Severe recurrent major depression without psychotic features (Keith Gibbs); Coronary artery disease; Alcohol use disorder, moderate, dependence (Keith Gibbs); Cocaine use disorder, moderate, dependence (Keith Gibbs); Cannabis use disorder, moderate, dependence (Keith Gibbs); Overdose of medication, intentional self-harm, sequela (Keith Gibbs); Tobacco use disorder; Chronic knee pain (Primary Area of Pain)  (Bilateral) (R>L); Chronic low back pain (Secondary Area of Pain) (Bilateral); Chronic hip pain (Tertiary Area of Pain) (Bilateral) (R>L); Chronic shoulder pain (Fourth Area of Pain) (Right); Spondylosis without myelopathy or radiculopathy, cervical region; Chronic pain syndrome; Long term current use of opiate analgesic; Opiate use; Pharmacologic therapy; Disorder of skeletal system; Problems influencing health status; 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); Chronic neck pain (Fifth Area of Pain) (Midline); Chronic musculoskeletal pain;  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 His primarily concern today is the Knee Pain (left and right) and Back Pain (low)  Pain Assessment: Location: Left Knee Radiating: denies Onset: More than a month ago Duration: Chronic pain Quality: Burning(stinging) Severity: 2 /10 (subjective, self-reported pain score)  Note: Reported level is compatible with observation.                         When using our objective Pain Scale, levels between 6 and 10/10 are said to belong in an emergency room, as it progressively worsens from a 6/10, described as severely limiting, requiring emergency care not usually available at an outpatient pain management facility. At a 6/10 level, communication becomes difficult and requires great effort. Assistance to reach the emergency department may be required. Facial flushing and profuse sweating along with potentially dangerous increases in heart rate and blood pressure will be evident. Effect on ADL:   Timing: Intermittent Modifying factors: injections, medications  Keith Gibbs was last seen on 11/02/2017 for a procedure. During today's appointment we reviewed Keith Gibbs's post-procedure results, as well as his outpatient medication regimen.  Further details on both, my assessment(s), as well as the proposed treatment plan, please see below.  Controlled Substance Pharmacotherapy Assessment REMS (Risk Evaluation and Mitigation Strategy)  Analgesic:  Hydrocodone/acetaminophen 5/325 mg 1 tablet in the 6 hours (20 mg/day of Hydrocodone) Highest recorded MME/day:19m/day MME/day:258mday TiDewayne ShorterRN  11/27/2017  8:15 AM  Signed Safety precautions to be maintained  throughout the outpatient stay will include: orient to surroundings, keep bed in low position, maintain call bell within reach at all times, provide assistance with transfer out of bed and ambulation.   Pharmacokinetics: Liberation and absorption (onset of action): WNL Distribution (time to peak effect): WNL Metabolism and excretion (duration of action): WNL         Pharmacodynamics: Desired effects: Analgesia: [name] reports >50% benefit. Functional ability: Patient reports that medication allows him to accomplish basic ADLs Clinically meaningful improvement in function (CMIF): Sustained CMIF goals met Perceived effectiveness: Described as relatively effective, allowing for increase in activities of daily living (ADL) Undesirable effects: Side-effects or Adverse reactions: None reported Monitoring: [place] PMP: Online review of the past 3-monthperiod conducted. Compliant with practice rules and regulations Last UDS on record: Summary  Date Value Ref Range Status  09/11/2017 FINAL  Final    Comment:    ==================================================================== TOXASSURE COMP DRUG ANALYSIS,UR ==================================================================== Test                             Result       Flag       Units Drug Present and Declared for Prescription Verification   Hydrocodone                    1255         EXPECTED   ng/mg creat   Dihydrocodeine                 168          EXPECTED   ng/mg creat   Norhydrocodone                 820          EXPECTED   ng/mg creat    Sources of hydrocodone include scheduled prescription    medications. Dihydrocodeine and norhydrocodone are expected    metabolites of hydrocodone. Dihydrocodeine is also  available as a    scheduled prescription medication.   Desmethylcyclobenzaprine       PRESENT      EXPECTED    Desmethylcyclobenzaprine is an expected metabolite of    cyclobenzaprine.   Bupropion                      PRESENT      EXPECTED   Hydroxybupropion               PRESENT      EXPECTED    Hydroxybupropion is an expected metabolite of bupropion.   Acetaminophen                  PRESENT      EXPECTED Drug Present not Declared for Prescription Verification   Guaifenesin                    PRESENT      UNEXPECTED    Guaifenesin may be administered as an over-the-counter or    prescription drug; it may also be present as a breakdown product    of methocarbamol. Drug Absent but Declared for Prescription Verification   Salicylate                     Not Detected UNEXPECTED    Aspirin, as indicated in the declared medication list, is not    always detected even when used as directed. ==================================================================== Test  Result    Flag   Units      Ref Range   Creatinine              74               mg/dL      >=20 ==================================================================== Declared Medications:  The flagging and interpretation on this report are based on the  following declared medications.  Unexpected results may arise from  inaccuracies in the declared medications.  **Note: The testing scope of this panel includes these medications:  Bupropion  Cyclobenzaprine  Hydrocodone (Hydrocodone-Acetaminophen)  **Note: The testing scope of this panel does not include small to  moderate amounts of these reported medications:  Acetaminophen  Acetaminophen (Hydrocodone-Acetaminophen)  Aspirin (Aspirin 81)  **Note: The testing scope of this panel does not include following  reported medications:  Albuterol  Atorvastatin  Cetirizine  Isosorbide Mononitrate  Melatonin  Mometasone  Multivitamin  Nitroglycerin   Omeprazole  Prazosin (Minipress)  Ranolazine (Ranexa)  Ropinirole (Requip)  Tamsulosin (Flomax)  Ticagrelor (Brilinta) ==================================================================== For clinical consultation, please call [phone]. ====================================================================    UDS interpretation: Compliant          Medication Assessment Form: Reviewed. Patient indicates being compliant with therapy Treatment compliance: Compliant Risk Assessment Profile: Aberrant behavior: See prior evaluations. None observed or detected today Comorbid factors increasing risk of overdose: See prior notes. No additional risks detected today Risk of substance use disorder (SUD): Low Opioid Risk Tool - 11/27/17 0813      Family History of Substance Abuse   Alcohol  Positive Male    Illegal Drugs  Negative    Rx Drugs  Negative      Personal History of Substance Abuse   Alcohol  Positive Male or Male    Illegal Drugs  Positive Male or Male    Rx Drugs  Negative      Age   Age between 75-45 years   No      History of Preadolescent Sexual Abuse   History of Preadolescent Sexual Abuse  Negative or Male      Psychological Disease   Psychological Disease  Positive    Bipolar  Positive    Schizophrenia  Positive    Depression  Positive      Total Score   Opioid Risk Tool Scoring  13    Opioid Risk Interpretation  High Risk      ORT Scoring interpretation table:  Score <3 = Low Risk for SUD  Score between 4-7 = Moderate Risk for SUD  Score >8 = High Risk for Opioid Abuse   Risk Mitigation Strategies:  Patient Counseling: Covered Patient-Prescriber Agreement (PPA): Present and active  Notification to other healthcare providers: Done  Pharmacologic Plan: No change in therapy, at this time.             Post-Procedure Assessment  11/02/2017 Procedure: Diagnostic left intra-articular knee joint injection with local anesthetic and steroid, no  fluoroscopy or sedation Pre-procedure pain score:  2/10 Post-procedure pain score: 0/10 (100% relief) Influential Factors: BMI: 28.12 kg/m Intra-procedural challenges: None observed.         Assessment challenges: None detected.              Reported side-effects: None.        Post-procedural adverse reactions or complications: None reported         Sedation: No sedation used. When no sedatives are used, the analgesic levels obtained are directly associated  to the effectiveness of the local anesthetics. However, when sedation is provided, the level of analgesia obtained during the initial 1 hour following the intervention, is believed to be the result of a combination of factors. These factors may include, but are not limited to: 1. The effectiveness of the local anesthetics used. 2. The effects of the analgesic(s) and/or anxiolytic(s) used. 3. The degree of discomfort experienced by the patient at the time of the procedure. 4. The patients ability and reliability in recalling and recording the events. 5. The presence and influence of possible secondary gains and/or psychosocial factors. Reported result: Relief experienced during the 1st hour after the procedure: 100 % (Ultra-Short Term Relief)            Interpretative annotation: Clinically appropriate result. No IV Analgesic or Anxiolytic given, therefore benefits are completely due to Local Anesthetic effects.          Effects of local anesthetic: The analgesic effects attained during this period are directly associated to the localized infiltration of local anesthetics and therefore cary significant diagnostic value as to the etiological location, or anatomical origin, of the pain. Expected duration of relief is directly dependent on the pharmacodynamics of the local anesthetic used. Long-acting (4-6 hours) anesthetics used.  Reported result: Relief during the next 4 to 6 hour after the procedure: 100 % (Short-Term Relief)             Interpretative annotation: Clinically appropriate result. Analgesia during this period is likely to be Local Anesthetic-related.          Long-term benefit: Defined as the period of time past the expected duration of local anesthetics (1 hour for short-acting and 4-6 hours for long-acting). With the possible exception of prolonged sympathetic blockade from the local anesthetics, benefits during this period are typically attributed to, or associated with, other factors such as analgesic sensory neuropraxia, antiinflammatory effects, or beneficial biochemical changes provided by agents other than the local anesthetics.  Reported result: Extended relief following procedure: 80 % (Long-Term Relief)            Interpretative annotation: Clinically appropriate result. Good relief. No permanent benefit expected. Inflammation plays a part in the etiology to the pain. Etiology is likely inflammatory and mechanical.  Current benefits: Defined as reported results that persistent at this point in time.   Analgesia: >50 % Keith Gibbs reports improvement of arthralgia. Function: Keith Gibbs reports improvement in function ROM: Keith Gibbs reports improvement in ROM Interpretative annotation: Ongoing benefit. Therapeutic benefit observed. Effective therapeutic approach. Benefit could be steroid-related.  Interpretation: Results would suggest a successful diagnostic intervention.                  Plan:  We will go ahead with a series of 5 intra-articular Hyalgan knee injections to see if we can improve this pain further.                Laboratory Chemistry  Inflammation Markers (CRP: Acute Phase) (ESR: Chronic Phase) Lab Results  Component Value Date   CRP 24.6 (H) 09/11/2017   ESRSEDRATE 16 09/11/2017   LATICACIDVEN 1.2 02/20/2017                         Rheumatology Markers Lab Results  Component Value Date   ANA Negative 11/01/2016                        Renal Function Markers  Lab Results   Component Value Date   BUN 11 09/11/2017   CREATININE 0.98 09/11/2017   GFRAA 99 09/11/2017   GFRNONAA 85 09/11/2017                              Hepatic Function Markers Lab Results  Component Value Date   AST 15 09/11/2017   ALT 31 02/20/2017   ALBUMIN 4.2 09/11/2017   ALKPHOS 71 09/11/2017   HCVAB >11.0 (H) 11/01/2016   LIPASE 45 02/20/2017                        Electrolytes Lab Results  Component Value Date   NA 139 09/11/2017   K 4.7 09/11/2017   CL 99 09/11/2017   CALCIUM 9.0 09/11/2017   MG 2.1 09/11/2017                        Neuropathy Markers Lab Results  Component Value Date   VITAMINB12 457 09/11/2017   HGBA1C 5.2 12/28/2016                        Bone Pathology Markers Lab Results  Component Value Date   25OHVITD1 35 09/11/2017   25OHVITD2 <1.0 09/11/2017   25OHVITD3 35 09/11/2017                         Coagulation Parameters Lab Results  Component Value Date   INR 1.02 11/01/2016   LABPROT 13.4 11/01/2016   PLT 249 08/30/2017                        Cardiovascular Markers Lab Results  Component Value Date   BNP 12.0 12/26/2016   TROPONINI <0.03 02/20/2017   HGB 15.4 08/30/2017   HCT 44.9 08/30/2017                         Note: Lab results reviewed.  Recent Diagnostic Imaging Results  DG Knee Complete 4 Views Right CLINICAL DATA:  Fall  EXAM: RIGHT KNEE - COMPLETE 4+ VIEW  COMPARISON:  03/04/2017  FINDINGS: No acute displaced fracture or malalignment. Mild patellofemoral degenerative change. Mild to moderate medial compartment degenerative change and mild lateral compartment degenerative change. Small to moderate knee effusion.  IMPRESSION: 1. No acute osseous abnormality. 2. Knee effusion  Electronically Signed   By: Donavan Foil M.D.   On: 10/27/2017 03:29  DG Lumbar Spine Complete CLINICAL DATA:  Fall  EXAM: LUMBAR SPINE - COMPLETE 4+ VIEW  COMPARISON:  08/14/2017, 01/19/2017  FINDINGS: Stable lumbar  alignment. Status post posterior rod and fixating screws L4 through S1 with interbody devices at L4-L5 and L5-S1. Chronic superior and anterior endplate deformity at L3 with limbus vertebra. Mild degenerative changes at L1-L2. Vertebral body heights are maintained.  IMPRESSION: 1. No acute osseous abnormality 2. Postsurgical changes L4 through S1.  Electronically Signed   By: Donavan Foil M.D.   On: 10/27/2017 03:25  DG Cervical Spine Complete CLINICAL DATA:  Fall  EXAM: CERVICAL SPINE - COMPLETE 4+ VIEW  COMPARISON:  09/19/2017  FINDINGS: Dens and lateral masses are within normal limits. Cervical alignment within normal limits. Moderate degenerative changes at C5-C6 and C6-C7. Bilateral foraminal narrowing at these levels. Prevertebral soft tissue thickness is normal  IMPRESSION: No  radiographic evidence for acute osseous abnormality. Moderate degenerative changes at C5-C6 and C6-C7  Electronically Signed   By: Donavan Foil M.D.   On: 10/27/2017 03:22  Complexity Note: I personally reviewed the fluoroscopic imaging of the procedure.                        Meds   Current Outpatient Medications:  .  acetaminophen (TYLENOL) 500 MG tablet, Take 2 tablets (1,000 mg total) by mouth every 6 (six) hours as needed. For pain., Disp: 90 tablet, Rfl: 1 .  albuterol (PROAIR HFA) 108 (90 Base) MCG/ACT inhaler, Inhale 2 puffs into the lungs every 6 (six) hours as needed for wheezing or shortness of breath., Disp: 1 Inhaler, Rfl: 1 .  aspirin EC 81 MG tablet, Take 1 tablet (81 mg total) by mouth daily., Disp: 30 tablet, Rfl: 1 .  atorvastatin (LIPITOR) 40 MG tablet, Take 1 tablet (40 mg total) by mouth daily at 6 PM., Disp: 30 tablet, Rfl: 1 .  buPROPion (WELLBUTRIN SR) 100 MG 12 hr tablet, Take 100 mg by mouth 2 (two) times daily., Disp: , Rfl:  .  cetirizine (ZYRTEC) 10 MG tablet, Take 10 mg by mouth daily., Disp: , Rfl:  .  cyclobenzaprine (FLEXERIL) 10 MG tablet, Take 1 tablet  (10 mg total) by mouth 3 (three) times daily as needed for muscle spasms., Disp: 90 tablet, Rfl: 0 .  divalproex (DEPAKOTE) 500 MG DR tablet, Take 500 mg by mouth 2 (two) times daily., Disp: , Rfl: 0 .  DULERA 200-5 MCG/ACT AERO, TAKE 2 PUFFS BY MOUTH TWICE A DAY, Disp: 1 Inhaler, Rfl: 1 .  HYDROcodone-acetaminophen (NORCO) 5-325 MG tablet, Take 1 tablet by mouth every 6 (six) hours as needed for severe pain. Max.: 4/day. Must Last 30 days., Disp: 120 tablet, Rfl: 0 .  isosorbide mononitrate (IMDUR) 30 MG 24 hr tablet, Take 1 tablet (30 mg total) by mouth daily., Disp: 30 tablet, Rfl: 1 .  Melatonin 3 MG TABS, Take 3 mg by mouth at bedtime., Disp: , Rfl:  .  meloxicam (MOBIC) 7.5 MG tablet, Take 7.5 mg by mouth daily., Disp: , Rfl:  .  nitroGLYCERIN (NITROSTAT) 0.4 MG SL tablet, Place 1 tablet (0.4 mg total) under the tongue every 5 (five) minutes x 3 doses as needed for chest pain., Disp: 30 tablet, Rfl: 0 .  omeprazole (PRILOSEC) 40 MG capsule, Take 40 mg by mouth daily., Disp: , Rfl:  .  prazosin (MINIPRESS) 1 MG capsule, Take 1 capsule (1 mg total) by mouth at bedtime., Disp: 30 capsule, Rfl: 1 .  ranolazine (RANEXA) 500 MG 12 hr tablet, Take 500 mg by mouth 2 (two) times daily., Disp: , Rfl:  .  rOPINIRole (REQUIP) 0.5 MG tablet, TAKE 1 TO 2 TABLETS BY MOUTH TWICE DAILY AS NEEDED FOR RESTLESS LEGS., Disp: , Rfl: 1 .  tamsulosin (FLOMAX) 0.4 MG CAPS capsule, Take 1 capsule (0.4 mg total) by mouth daily., Disp: 30 capsule, Rfl: 1 .  ticagrelor (BRILINTA) 60 MG TABS tablet, Take 60 mg by mouth daily., Disp: , Rfl:  .  ticagrelor (BRILINTA) 90 MG TABS tablet, Take 1 tablet (90 mg total) by mouth 2 (two) times daily., Disp: 60 tablet, Rfl: 0  ROS  Constitutional: Denies any fever or chills Gastrointestinal: No reported hemesis, hematochezia, vomiting, or acute GI distress Musculoskeletal: Denies any acute onset joint swelling, redness, loss of ROM, or weakness Neurological: No reported episodes  of acute  onset apraxia, aphasia, dysarthria, agnosia, amnesia, paralysis, loss of coordination, or loss of consciousness  Allergies  Keith Gibbs is allergic to latuda [lurasidone hcl]; naproxen; and saphris [asenapine].  [place]  Drug: Keith Gibbs  reports that he does not use drugs. Alcohol:  reports that he drinks alcohol. Tobacco:  reports that he has been smoking cigarettes.  He has been smoking about 0.50 packs per day. He has never used smokeless tobacco. Medical:  has a past medical history of Anxiety, Arthritis, Bursitis, COPD (chronic obstructive pulmonary disease) ([place]), Depression, GERD (gastroesophageal reflux disease), Hepatitis C (2012), Hypertension, Lung disease, MI (myocardial infarction) ([place]), and Stroke ([place]). Surgical: Keith Gibbs  has a past surgical history that includes Appendectomy; Shoulder surgery (Right, 04/09/2012); Back surgery; Abdominal surgery; Colonoscopy; Cardiac catheterization (Left, 06/10/2016); Cardiac catheterization (N/A, 06/10/2016); Esophagogastroduodenoscopy (egd) with propofol (N/A, 01/05/2017); Colonoscopy with propofol (N/A, 01/05/2017); Coronary angioplasty with stent; and Knee arthroscopy with medial menisectomy (Right, 09/05/2017). Family: family history includes Early death in his father; Heart disease in his father and mother; Heart failure in his father; Hypertension in his father, mother, and sister; Osteoarthritis in his mother.  Constitutional Exam  General appearance: Well nourished, well developed, and well hydrated. In no apparent acute distress Vitals:   11/27/17 0808  BP: (!) 155/81  Pulse: (!) 56  Resp: 18  Temp: 97.7 F (36.5 C)  SpO2: 100%  Weight: 225 lb (102.1 kg)  Height: 6' 3"  (1.905 m)   BMI Assessment: Estimated body mass index is 28.12 kg/m as calculated from the following:   Height as of this encounter: 6' 3"  (1.905 m).   Weight as of this encounter: 225 lb (102.1 kg).  BMI interpretation table: BMI level Category Range  association with higher incidence of chronic pain  <18 kg/m2 Underweight   18.5-24.9 kg/m2 Ideal body weight   25-29.9 kg/m2 Overweight Increased incidence by 20%  30-34.9 kg/m2 Obese (Class I) Increased incidence by 68%  35-39.9 kg/m2 Severe obesity (Class II) Increased incidence by 136%  >40 kg/m2 Extreme obesity (Class III) Increased incidence by 254%   Patient's current BMI Ideal Body weight  Body mass index is 28.12 kg/m. Ideal body weight: 84.5 kg (186 lb 4.6 oz) Adjusted ideal body weight: 91.5 kg (201 lb 12.4 oz)   BMI Readings from Last 4 Encounters:  11/27/17 28.12 kg/m  11/02/17 26.00 kg/m  11/01/17 25.25 kg/m  10/30/17 26.00 kg/m   Wt Readings from Last 4 Encounters:  11/27/17 225 lb (102.1 kg)  11/02/17 208 lb (94.3 kg)  11/01/17 202 lb (91.6 kg)  10/30/17 208 lb (94.3 kg)  Psych/Mental status: Alert, oriented x 3 (person, place, & time)       Eyes: PERLA Respiratory: No evidence of acute respiratory distress  Cervical Spine Area Exam  Skin & Axial Inspection: No masses, redness, edema, swelling, or associated skin lesions Alignment: Symmetrical Functional ROM: Unrestricted ROM      Stability: No instability detected Muscle Tone/Strength: Functionally intact. No obvious neuro-muscular anomalies detected. Sensory (Neurological): Unimpaired Palpation: No palpable anomalies              Upper Extremity (UE) Exam    Side: Right upper extremity  Side: Left upper extremity  Skin & Extremity Inspection: Skin color, temperature, and hair growth are WNL. No peripheral edema or cyanosis. No masses, redness, swelling, asymmetry, or associated skin lesions. No contractures.  Skin & Extremity Inspection: Skin color, temperature, and hair growth are WNL. No peripheral edema or cyanosis. No masses, redness,  swelling, asymmetry, or associated skin lesions. No contractures.  Functional ROM: Unrestricted ROM          Functional ROM: Unrestricted ROM          Muscle  Tone/Strength: Functionally intact. No obvious neuro-muscular anomalies detected.  Muscle Tone/Strength: Functionally intact. No obvious neuro-muscular anomalies detected.  Sensory (Neurological): Unimpaired          Sensory (Neurological): Unimpaired          Palpation: No palpable anomalies              Palpation: No palpable anomalies              Specialized Test(s): Deferred         Specialized Test(s): Deferred          Thoracic Spine Area Exam  Skin & Axial Inspection: No masses, redness, or swelling Alignment: Symmetrical Functional ROM: Unrestricted ROM Stability: No instability detected Muscle Tone/Strength: Functionally intact. No obvious neuro-muscular anomalies detected. Sensory (Neurological): Unimpaired Muscle strength & Tone: No palpable anomalies  Lumbar Spine Area Exam  Skin & Axial Inspection: No masses, redness, or swelling Alignment: Symmetrical Functional ROM: Unrestricted ROM       Stability: No instability detected Muscle Tone/Strength: Functionally intact. No obvious neuro-muscular anomalies detected. Sensory (Neurological): Unimpaired Palpation: No palpable anomalies       Provocative Tests: Lumbar Hyperextension and rotation test: evaluation deferred today       Lumbar Lateral bending test: evaluation deferred today       Patrick's Maneuver: evaluation deferred today                    Gait & Posture Assessment  Ambulation: Unassisted Gait: Relatively normal for age and body habitus Posture: WNL   Lower Extremity Exam    Side: Right lower extremity  Side: Left lower extremity  Stability: No instability observed          Stability: No instability observed          Skin & Extremity Inspection: Skin color, temperature, and hair growth are WNL. No peripheral edema or cyanosis. No masses, redness, swelling, asymmetry, or associated skin lesions. No contractures.  Skin & Extremity Inspection: Skin color, temperature, and hair growth are WNL. No peripheral edema  or cyanosis. No masses, redness, swelling, asymmetry, or associated skin lesions. No contractures.  Functional ROM: Unrestricted ROM                  Functional ROM: Improved after treatment                  Muscle Tone/Strength: Functionally intact. No obvious neuro-muscular anomalies detected.  Muscle Tone/Strength: Functionally intact. No obvious neuro-muscular anomalies detected.  Sensory (Neurological): Unimpaired  Sensory (Neurological): Unimpaired  Palpation: No palpable anomalies  Palpation: No palpable anomalies   Assessment  Primary Diagnosis & Pertinent Problem List: The primary encounter diagnosis was Chronic knee pain (Primary Area of Pain)  (Bilateral) (R>L). 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  Status Diagnosis  Improved Controlled Controlled 1. Chronic knee pain (Primary Area of Pain)  (Bilateral) (R>L)   2. Chronic low back pain (Secondary Area of Pain) (Bilateral)   3. 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 Chronic pain syndrome   5.  Chronic musculoskeletal pain   6. 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)     Problems updated and reviewed during this visit: No problems updated. Plan of Care  Pharmacotherapy (Medications Ordered): No orders of the defined types were placed in this encounter.  Medications administered today: Jaidev W. Carles had no medications administered during this visit.   Procedure Orders     KNEE INJECTION     KNEE INJECTION Lab Orders  No laboratory test(s) ordered today   Imaging Orders  No imaging studies ordered today   Referral Orders  No referral(s) requested today    Interventional management options: Planned, scheduled, and/or pending:   Therapeutic left intra-articular  Hyalgan knee injection #1, no fluoroscopy or IV sedation   Considering:   Diagnostic left intra-articular Hyalgan knee injection series  Diagnostic bilateral genicular nerve block Possiblebilateral genicular nerve RFA Diagnostic lumbar facet nerve block Possible bilateral lumbar facet RFA Diagnostic bilateral hip injections Diagnosticright suprascapular nerve block Possible right suprascapular nerve RFA Diagnosticbilateral cervical facet nerve block Possible bilateral cervical RFA   Palliative PRN treatment(s):   None at this time   Provider-requested follow-up: Return for Procedure (no sedation): (L) Hyalgan #1.  Future Appointments  Date Time Provider [place]  11/30/2017 12:45 PM Vevelyn Francois, NP [facility] None   Primary Care Physician: Keith Marble, MD Location: [facility] Note by: Gaspar Cola, MD Date: 11/27/2017; Time: 8:48 AM

## 2017-11-27 NOTE — Progress Notes (Signed)
Safety precautions to be maintained throughout the outpatient stay will include: orient to surroundings, keep bed in low position, maintain call bell within reach at all times, provide assistance with transfer out of bed and ambulation.  

## 2017-11-27 NOTE — Patient Instructions (Addendum)
____________________________________________________________________________________________  Preparing for your procedure (without sedation)  Instructions: . Oral Intake: Do not eat or drink anything for at least 3 hours prior to your procedure. . Transportation: Unless otherwise stated by your physician, you may drive yourself after the procedure. . Blood Pressure Medicine: Take your blood pressure medicine with a sip of water the morning of the procedure. . Blood thinners:  . Diabetics on insulin: Notify the staff so that you can be scheduled 1st case in the morning. If your diabetes requires high dose insulin, take only  of your normal insulin dose the morning of the procedure and notify the staff that you have done so. . Preventing infections: Shower with an antibacterial soap the morning of your procedure.  . Build-up your immune system: Take 1000 mg of Vitamin C with every meal (3 times a day) the day prior to your procedure. [place] Kitchen Antibiotics: Inform the staff if you have a condition or reason that requires you to take antibiotics before dental procedures. . Pregnancy: If you are pregnant, call and cancel the procedure. . Sickness: If you have a cold, fever, or any active infections, call and cancel the procedure. . Arrival: You must be in the facility at least 30 minutes prior to your scheduled procedure. . Children: Do not bring any children with you. . Dress appropriately: Bring dark clothing that you would not mind if they get stained. . Valuables: Do not bring any jewelry or valuables.  Procedure appointments are reserved for interventional treatments only. [place] Kitchen No Prescription Refills. . No medication changes will be discussed during procedure appointments. . No disability issues will be discussed.  Remember:  Regular Business hours are:  Monday to Thursday 8:00 AM to 4:00 PM  Provider's Schedule: Milinda Pointer, MD:  Procedure days: Tuesday and Thursday 7:30 AM to 4:00  PM  Gillis Santa, MD:  Procedure days: Monday and Wednesday 7:30 AM to 4:00 PM ____________________________________________________________________________________________   Medication recommended by Dr. Dossie Arbour for cartilage: Glucosamine & Chondroitin (Triple Strength) - Over-the-counter (use as directed.   Knee Injection A knee injection is a procedure to get medicine into your knee joint. Your health care provider puts a needle into the joint and injects medicine with an attached syringe. The injected medicine may relieve the pain, swelling, and stiffness of arthritis. The injected medicine may also help to lubricate and cushion your knee joint. You may need more than one injection. Tell a health care provider about:  Any allergies you have.  All medicines you are taking, including vitamins, herbs, eye drops, creams, and over-the-counter medicines.  Any problems you or family members have had with anesthetic medicines.  Any blood disorders you have.  Any surgeries you have had.  Any medical conditions you have. What are the risks? Generally, this is a safe procedure. However, problems may occur, including:  Infection.  Bleeding.  Worsening symptoms.  Damage to the area around your knee.  Allergic reaction to any of the medicines.  Skin reactions from repeated injections.  What happens before the procedure?  Ask your health care provider about changing or stopping your regular medicines. This is especially important if you are taking diabetes medicines or blood thinners.  Plan to have someone take you home after the procedure. What happens during the procedure?  You will sit or lie down in a position for your knee to be treated.  The skin over your kneecap will be cleaned with a germ-killing solution (antiseptic).  You will be given a medicine  that numbs the area (local anesthetic). You may feel some stinging.  After your knee becomes numb, you will have a  second injection. This is the medicine. This needle is carefully placed between your kneecap and your knee. The medicine is injected into the joint space.  At the end of the procedure, the needle will be removed.  A bandage (dressing) may be placed over the injection site. The procedure may vary among health care providers and hospitals. What happens after the procedure?  You may have to move your knee through its full range of motion. This helps to get all of the medicine into your joint space.  Your blood pressure, heart rate, breathing rate, and blood oxygen level will be monitored often until the medicines you were given have worn off.  You will be watched to make sure that you do not have a reaction to the injected medicine. This information is not intended to replace advice given to you by your health care provider. Make sure you discuss any questions you have with your health care provider. Document Released: 10/02/2006 Document Revised: 12/11/2015 Document Reviewed: 05/21/2014 Elsevier Interactive Patient Education  2018 [employer].

## 2017-11-30 ENCOUNTER — Ambulatory Visit: Payer: Medicaid Other | Attending: Nurse Practitioner | Admitting: Nurse Practitioner

## 2017-11-30 ENCOUNTER — Encounter: Payer: Self-pay | Admitting: Nurse Practitioner

## 2017-11-30 ENCOUNTER — Other Ambulatory Visit: Payer: Self-pay

## 2017-11-30 VITALS — BP 164/96 | HR 58 | Temp 98.5°F | Resp 18 | Ht 75.0 in | Wt 210.0 lb

## 2017-11-30 DIAGNOSIS — F419 Anxiety disorder, unspecified: Secondary | ICD-10-CM | POA: Diagnosis not present

## 2017-11-30 DIAGNOSIS — Z9889 Other specified postprocedural states: Secondary | ICD-10-CM | POA: Insufficient documentation

## 2017-11-30 DIAGNOSIS — M7918 Myalgia, other site: Secondary | ICD-10-CM

## 2017-11-30 DIAGNOSIS — Z886 Allergy status to analgesic agent status: Secondary | ICD-10-CM | POA: Diagnosis not present

## 2017-11-30 DIAGNOSIS — Z8249 Family history of ischemic heart disease and other diseases of the circulatory system: Secondary | ICD-10-CM | POA: Diagnosis not present

## 2017-11-30 DIAGNOSIS — M25552 Pain in left hip: Secondary | ICD-10-CM | POA: Diagnosis not present

## 2017-11-30 DIAGNOSIS — B182 Chronic viral hepatitis C: Secondary | ICD-10-CM | POA: Insufficient documentation

## 2017-11-30 DIAGNOSIS — Z79899 Other long term (current) drug therapy: Secondary | ICD-10-CM | POA: Diagnosis not present

## 2017-11-30 DIAGNOSIS — J449 Chronic obstructive pulmonary disease, unspecified: Secondary | ICD-10-CM | POA: Insufficient documentation

## 2017-11-30 DIAGNOSIS — M47819 Spondylosis without myelopathy or radiculopathy, site unspecified: Secondary | ICD-10-CM | POA: Insufficient documentation

## 2017-11-30 DIAGNOSIS — M25561 Pain in right knee: Secondary | ICD-10-CM

## 2017-11-30 DIAGNOSIS — M545 Low back pain: Secondary | ICD-10-CM | POA: Diagnosis not present

## 2017-11-30 DIAGNOSIS — G894 Chronic pain syndrome: Secondary | ICD-10-CM

## 2017-11-30 DIAGNOSIS — M25562 Pain in left knee: Secondary | ICD-10-CM | POA: Diagnosis not present

## 2017-11-30 DIAGNOSIS — F17210 Nicotine dependence, cigarettes, uncomplicated: Secondary | ICD-10-CM | POA: Insufficient documentation

## 2017-11-30 DIAGNOSIS — M47812 Spondylosis without myelopathy or radiculopathy, cervical region: Secondary | ICD-10-CM | POA: Diagnosis not present

## 2017-11-30 DIAGNOSIS — K219 Gastro-esophageal reflux disease without esophagitis: Secondary | ICD-10-CM | POA: Diagnosis not present

## 2017-11-30 DIAGNOSIS — Z8673 Personal history of transient ischemic attack (TIA), and cerebral infarction without residual deficits: Secondary | ICD-10-CM | POA: Diagnosis not present

## 2017-11-30 DIAGNOSIS — M25551 Pain in right hip: Secondary | ICD-10-CM

## 2017-11-30 DIAGNOSIS — I10 Essential (primary) hypertension: Secondary | ICD-10-CM | POA: Insufficient documentation

## 2017-11-30 DIAGNOSIS — I2510 Atherosclerotic heart disease of native coronary artery without angina pectoris: Secondary | ICD-10-CM | POA: Diagnosis not present

## 2017-11-30 DIAGNOSIS — F209 Schizophrenia, unspecified: Secondary | ICD-10-CM | POA: Diagnosis not present

## 2017-11-30 DIAGNOSIS — G8929 Other chronic pain: Secondary | ICD-10-CM

## 2017-11-30 DIAGNOSIS — Z7982 Long term (current) use of aspirin: Secondary | ICD-10-CM | POA: Diagnosis not present

## 2017-11-30 DIAGNOSIS — F319 Bipolar disorder, unspecified: Secondary | ICD-10-CM | POA: Diagnosis not present

## 2017-11-30 DIAGNOSIS — Z5181 Encounter for therapeutic drug level monitoring: Secondary | ICD-10-CM | POA: Insufficient documentation

## 2017-11-30 MED ORDER — CYCLOBENZAPRINE HCL 10 MG PO TABS: 10.0000 mg | ORAL_TABLET | Freq: Three times a day (TID) | ORAL | 0 refills | Status: DC | PRN

## 2017-11-30 MED ORDER — HYDROCODONE-ACETAMINOPHEN 5-325 MG PO TABS: 1.0000 | ORAL_TABLET | Freq: Four times a day (QID) | ORAL | 0 refills | Status: DC | PRN

## 2017-11-30 NOTE — Patient Instructions (Addendum)
____________________________________________________________________________________________  Medication Rules  Applies to: All patients receiving prescriptions (written or electronic).  Pharmacy of record: Pharmacy where electronic prescriptions will be sent. If written prescriptions are taken to a different pharmacy, please inform the nursing staff. The pharmacy listed in the electronic medical record should be the one where you would like electronic prescriptions to be sent.  Prescription refills: Only during scheduled appointments. Applies to both, written and electronic prescriptions.  NOTE: The following applies primarily to controlled substances (Opioid* Pain Medications).   Patient's responsibilities: 1. Pain Pills: Bring all pain pills to every appointment (except for procedure appointments). 2. Pill Bottles: Bring pills in original pharmacy bottle. Always bring newest bottle. Bring bottle, even if empty. 3. Medication refills: You are responsible for knowing and keeping track of what medications you need refilled. The day before your appointment, write a list of all prescriptions that need to be refilled. Bring that list to your appointment and give it to the admitting nurse. Prescriptions will be written only during appointments. If you forget a medication, it will not be "Called in", "Faxed", or "electronically sent". You will need to get another appointment to get these prescribed. 4. Prescription Accuracy: You are responsible for carefully inspecting your prescriptions before leaving our office. Have the discharge nurse carefully go over each prescription with you, before taking them home. Make sure that your name is accurately spelled, that your address is correct. Check the name and dose of your medication to make sure it is accurate. Check the number of pills, and the written instructions to make sure they are clear and accurate. Make sure that you are given enough medication to last  until your next medication refill appointment. 5. Taking Medication: Take medication as prescribed. Never take more pills than instructed. Never take medication more frequently than prescribed. Taking less pills or less frequently is permitted and encouraged, when it comes to controlled substances (written prescriptions).  6. Inform other Doctors: Always inform, all of your healthcare providers, of all the medications you take. 7. Pain Medication from other Providers: You are not allowed to accept any additional pain medication from any other Doctor or Healthcare provider. There are two exceptions to this rule. (see below) In the event that you require additional pain medication, you are responsible for notifying us, as stated below. 8. Medication Agreement: You are responsible for carefully reading and following our Medication Agreement. This must be signed before receiving any prescriptions from our practice. Safely store a copy of your signed Agreement. Violations to the Agreement will result in no further prescriptions. (Additional copies of our Medication Agreement are available upon request.) 9. Laws, Rules, & Regulations: All patients are expected to follow all Federal and State Laws, Statutes, Rules, & Regulations. Ignorance of the Laws does not constitute a valid excuse. The use of any illegal substances is prohibited. 10. Adopted CDC guidelines & recommendations: Target dosing levels will be at or below 60 MME/day. Use of benzodiazepines** is not recommended.  Exceptions: There are only two exceptions to the rule of not receiving pain medications from other Healthcare Providers. 1. Exception #1 (Emergencies): In the event of an emergency (i.e.: accident requiring emergency care), you are allowed to receive additional pain medication. However, you are responsible for: As soon as you are able, call our office [phone], at any time of the day or night, and leave a message stating your name, the  date and nature of the emergency, and the name and dose of the medication   prescribed. In the event that your call is answered by a member of our staff, make sure to document and save the date, time, and the name of the person that took your information.  2. Exception #2 (Planned Surgery): In the event that you are scheduled by another doctor or dentist to have any type of surgery or procedure, you are allowed (for a period no longer than 30 days), to receive additional pain medication, for the acute post-op pain. However, in this case, you are responsible for picking up a copy of our "Post-op Pain Management for Surgeons" handout, and giving it to your surgeon or dentist. This document is available at our office, and does not require an appointment to obtain it. Simply go to our office during business hours (Monday-Thursday from 8:00 AM to 4:00 PM) (Friday 8:00 AM to 12:00 Noon) or if you have a scheduled appointment with [place], prior to your surgery, and ask for it by name. In addition, you will need to provide [place] with your name, name of your surgeon, type of surgery, and date of procedure or surgery.  *Opioid medications include: morphine, codeine, oxycodone, oxymorphone, hydrocodone, hydromorphone, meperidine, tramadol, tapentadol, buprenorphine, fentanyl, methadone. **Benzodiazepine medications include: diazepam (Valium), alprazolam (Xanax), clonazepam (Klonopine), lorazepam (Ativan), clorazepate (Tranxene), chlordiazepoxide (Librium), estazolam (Prosom), oxazepam (Serax), temazepam (Restoril), triazolam (Halcion) (Last updated: 09/21/2017) ____________________________________________________________________________________________  [facility] were given one prescription for Hydrocodone today. A prescription for Flexeril was sent to your pharmacy.

## 2017-11-30 NOTE — Progress Notes (Signed)
Patient's Name: Keith Gibbs  MRN: [id number]  Referring Provider: Jodi Marble, MD  DOB: 05/02/1961  PCP: Jodi Marble, MD  DOS: 11/30/2017  Note by: Vevelyn Francois NP  Service setting: Ambulatory outpatient  Specialty: Interventional Pain Management  Location: [facility] (AMB) Pain Management Facility    Patient type: Established    Primary Reason(s) for Visit: Encounter for prescription drug management. (Level of risk: moderate)  CC: Back Pain (low); Knee Pain (right); and Hip Pain (bilateral)  HPI  [name] is a 57 y.o. year old, male patient, who comes today for a medication management evaluation. He has Schizophrenia ([place]); COPD (chronic obstructive pulmonary disease) ([place]); Chronic hepatitis C ([place]); GERD (gastroesophageal reflux disease); Swollen testicle; Abnormal ejaculation; Chest pain, unspecified; Bipolar 1 disorder ([place]); Cocaine abuse in remission ([place]); Chest pain; Angina at rest [facility]); Severe recurrent major depression without psychotic features ([place]); Coronary artery disease; Alcohol use disorder, moderate, dependence ([place]); Cocaine use disorder, moderate, dependence ([place]); Cannabis use disorder, moderate, dependence ([place]); Overdose of medication, intentional self-harm, sequela ([place]); Tobacco use disorder; Chronic knee pain (Primary Area of Pain)  (Bilateral) (R>L); Chronic low back pain (Secondary Area of Pain) (Bilateral); Chronic hip pain (Tertiary Area of Pain) (Bilateral) (R>L); Chronic shoulder pain (Fourth Area of Pain) (Right); Spondylosis without myelopathy or radiculopathy, cervical region; Chronic pain syndrome; Long term current use of opiate analgesic; Opiate use; Pharmacologic therapy; Disorder of skeletal system; Problems influencing health status; 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); Chronic neck pain (Fifth Area of Pain) ([place]); Chronic musculoskeletal pain; 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 His primarily concern today is the Back Pain (low); Knee Pain (right); and Hip Pain (bilateral)  Pain Assessment: Location: Lower Back Radiating: hips and right knee at times Onset: More than a month ago Duration: Chronic pain Quality: Constant, Stabbing, Aching, Dull Severity: 2 /10 (subjective, self-reported pain score)  Note: Reported level is compatible with observation.                          Timing: Constant Modifying factors: medications, heat, rest BP: (!) 164/96  HR: (!) 58  Mr. Sciuto was last scheduled for an appointment on 10/30/2017 for medication management. During today's appointment we reviewed [name]'s chronic pain status, as well as his outpatient medication regimen. He admits that the is not able to sleep secondary to his pain. He feels like he is getting a shooting stabbing pain in his hips. He states that he has reposition himself. He admits that he also tries to get warm.   The patient  reports that he does not use drugs. His body mass index is 26.25 kg/m.  Further details on both, my assessment(s), as well as the proposed treatment plan, please see below.  Controlled Substance Pharmacotherapy Assessment REMS (Risk Evaluation and Mitigation Strategy)  Analgesic: Hydrocodone/acetaminophen 5/325 mg 1 tablet in the 6hours (20 mg/dayof Hydrocodone) MME/day:59m/day   SHart Rochester RN  11/30/2017  1:09 PM  Sign at close encounter Nursing Pain Medication Assessment:  Safety precautions to be  maintained throughout the outpatient stay will include: orient  to surroundings, keep bed in low position, maintain call bell within reach at all times, provide assistance with transfer out of bed and ambulation.  Medication Inspection Compliance: Pill count conducted under aseptic conditions, in front of the patient. Neither the pills nor the bottle was removed from the patient's sight at any time. Once count was completed pills were immediately returned to the patient in their original bottle.  Medication: Hydrocodone/APAP Pill/Patch Count: 3 of 120 pills remain Pill/Patch Appearance: Markings consistent with prescribed medication Bottle Appearance: Standard pharmacy container. Clearly labeled. Filled Date: 04 / 10 / 2019 Last Medication intake:  Today Pharmacokinetics: Liberation and absorption (onset of action): WNL Distribution (time to peak effect): WNL Metabolism and excretion (duration of action): WNL         Pharmacodynamics: Desired effects: Analgesia: [name] reports >50% benefit. Functional ability: Patient reports that medication allows him to accomplish basic ADLs Clinically meaningful improvement in function (CMIF): Sustained CMIF goals met Perceived effectiveness: Described as relatively effective, allowing for increase in activities of daily living (ADL) Undesirable effects: Side-effects or Adverse reactions: None reported Monitoring: [place] PMP: Online review of the past 72-monthperiod conducted. Compliant with practice rules and regulations Last UDS on record: Summary  Date Value Ref Range Status  09/11/2017 FINAL  Final    Comment:    ==================================================================== TOXASSURE COMP DRUG ANALYSIS,UR ==================================================================== Test                             Result       Flag       Units Drug Present and Declared for Prescription Verification   Hydrocodone                    1255          EXPECTED   ng/mg creat   Dihydrocodeine                 168          EXPECTED   ng/mg creat   Norhydrocodone                 820          EXPECTED   ng/mg creat    Sources of hydrocodone include scheduled prescription    medications. Dihydrocodeine and norhydrocodone are expected    metabolites of hydrocodone. Dihydrocodeine is also available as a    scheduled prescription medication.   Desmethylcyclobenzaprine       PRESENT      EXPECTED    Desmethylcyclobenzaprine is an expected metabolite of    cyclobenzaprine.   Bupropion                      PRESENT      EXPECTED   Hydroxybupropion               PRESENT      EXPECTED    Hydroxybupropion is an expected metabolite of bupropion.   Acetaminophen                  PRESENT      EXPECTED Drug Present not Declared for Prescription Verification   Guaifenesin                    PRESENT      UNEXPECTED    Guaifenesin may be administered as an over-the-counter or    prescription drug; it  may also be present as a breakdown product    of methocarbamol. Drug Absent but Declared for Prescription Verification   Salicylate                     Not Detected UNEXPECTED    Aspirin, as indicated in the declared medication list, is not    always detected even when used as directed. ==================================================================== Test                      Result    Flag   Units      Ref Range   Creatinine              74               mg/dL      >=20 ==================================================================== Declared Medications:  The flagging and interpretation on this report are based on the  following declared medications.  Unexpected results may arise from  inaccuracies in the declared medications.  **Note: The testing scope of this panel includes these medications:  Bupropion  Cyclobenzaprine  Hydrocodone (Hydrocodone-Acetaminophen)  **Note: The testing scope of this panel does not include small to  moderate amounts  of these reported medications:  Acetaminophen  Acetaminophen (Hydrocodone-Acetaminophen)  Aspirin (Aspirin 81)  **Note: The testing scope of this panel does not include following  reported medications:  Albuterol  Atorvastatin  Cetirizine  Isosorbide Mononitrate  Melatonin  Mometasone  Multivitamin  Nitroglycerin  Omeprazole  Prazosin (Minipress)  Ranolazine (Ranexa)  Ropinirole (Requip)  Tamsulosin (Flomax)  Ticagrelor (Brilinta) ==================================================================== For clinical consultation, please call [phone]. ====================================================================    UDS interpretation: Compliant          Medication Assessment Form: Reviewed. Patient indicates being compliant with therapy Treatment compliance: Compliant Risk Assessment Profile: Aberrant behavior: See prior evaluations. None observed or detected today Comorbid factors increasing risk of overdose: See prior notes. No additional risks detected today Risk of substance use disorder (SUD): Low Opioid Risk Tool - 11/27/17 0813      Family History of Substance Abuse   Alcohol  Positive Male    Illegal Drugs  Negative    Rx Drugs  Negative      Personal History of Substance Abuse   Alcohol  Positive Male or Male    Illegal Drugs  Positive Male or Male    Rx Drugs  Negative      Age   Age between 57-45 years   No      History of Preadolescent Sexual Abuse   History of Preadolescent Sexual Abuse  Negative or Male      Psychological Disease   Psychological Disease  Positive    Bipolar  Positive    Schizophrenia  Positive    Depression  Positive      Total Score   Opioid Risk Tool Scoring  13    Opioid Risk Interpretation  High Risk      ORT Scoring interpretation table:  Score <3 = Low Risk for SUD  Score between 4-7 = Moderate Risk for SUD  Score >8 = High Risk for Opioid Abuse   Risk Mitigation Strategies:  Patient Counseling:  Covered Patient-Prescriber Agreement (PPA): Present and active  Notification to other healthcare providers: Done  Pharmacologic Plan: No change in therapy, at this time.             Laboratory Chemistry  Inflammation Markers (CRP: Acute Phase) (ESR: Chronic Phase) Lab Results  Component Value Date   CRP 24.6 (H) 09/11/2017   ESRSEDRATE 16 09/11/2017   LATICACIDVEN 1.2 02/20/2017                         Rheumatology Markers Lab Results  Component Value Date   ANA Negative 11/01/2016                        Renal Function Markers Lab Results  Component Value Date   BUN 11 09/11/2017   CREATININE 0.98 09/11/2017   GFRAA 99 09/11/2017   GFRNONAA 85 09/11/2017                              Hepatic Function Markers Lab Results  Component Value Date   AST 15 09/11/2017   ALT 31 02/20/2017   ALBUMIN 4.2 09/11/2017   ALKPHOS 71 09/11/2017   HCVAB >11.0 (H) 11/01/2016   LIPASE 45 02/20/2017                        Electrolytes Lab Results  Component Value Date   NA 139 09/11/2017   K 4.7 09/11/2017   CL 99 09/11/2017   CALCIUM 9.0 09/11/2017   MG 2.1 09/11/2017                        Neuropathy Markers Lab Results  Component Value Date   VITAMINB12 457 09/11/2017   HGBA1C 5.2 12/28/2016                        Bone Pathology Markers Lab Results  Component Value Date   25OHVITD1 35 09/11/2017   25OHVITD2 <1.0 09/11/2017   25OHVITD3 35 09/11/2017                         Coagulation Parameters Lab Results  Component Value Date   INR 1.02 11/01/2016   LABPROT 13.4 11/01/2016   PLT 249 08/30/2017                        Cardiovascular Markers Lab Results  Component Value Date   BNP 12.0 12/26/2016   TROPONINI <0.03 02/20/2017   HGB 15.4 08/30/2017   HCT 44.9 08/30/2017                         CA Markers No results found for: CEA, CA125, LABCA2                      Note: Lab results reviewed.  Recent Diagnostic Imaging Results  DG Knee Complete 4  Views Right CLINICAL DATA:  Fall  EXAM: RIGHT KNEE - COMPLETE 4+ VIEW  COMPARISON:  03/04/2017  FINDINGS: No acute displaced fracture or malalignment. Mild patellofemoral degenerative change. Mild to moderate medial compartment degenerative change and mild lateral compartment degenerative change. Small to moderate knee effusion.  IMPRESSION: 1. No acute osseous abnormality. 2. Knee effusion  Electronically Signed   By: Donavan Foil M.D.   On: 10/27/2017 03:29 DG Lumbar Spine Complete CLINICAL DATA:  Fall  EXAM: LUMBAR SPINE - COMPLETE 4+ VIEW  COMPARISON:  08/14/2017, 01/19/2017  FINDINGS: Stable lumbar alignment. Status post posterior rod and fixating screws L4 through S1 with interbody devices at  L4-L5 and L5-S1. Chronic superior and anterior endplate deformity at L3 with limbus vertebra. Mild degenerative changes at L1-L2. Vertebral body heights are maintained.  IMPRESSION: 1. No acute osseous abnormality 2. Postsurgical changes L4 through S1.  Electronically Signed   By: Donavan Foil M.D.   On: 10/27/2017 03:25 DG Cervical Spine Complete CLINICAL DATA:  Fall  EXAM: CERVICAL SPINE - COMPLETE 4+ VIEW  COMPARISON:  09/19/2017  FINDINGS: Dens and lateral masses are within normal limits. Cervical alignment within normal limits. Moderate degenerative changes at C5-C6 and C6-C7. Bilateral foraminal narrowing at these levels. Prevertebral soft tissue thickness is normal  IMPRESSION: No radiographic evidence for acute osseous abnormality. Moderate degenerative changes at C5-C6 and C6-C7  Electronically Signed   By: Donavan Foil M.D.   On: 10/27/2017 03:22  Complexity Note: Imaging results reviewed. Results shared with Mr. Gotschall, using Layman's terms.                         Meds   Current Outpatient Medications:  .  acetaminophen (TYLENOL) 500 MG tablet, Take 2 tablets (1,000 mg total) by mouth every 6 (six) hours as needed. For pain., Disp: 90  tablet, Rfl: 1 .  albuterol (PROAIR HFA) 108 (90 Base) MCG/ACT inhaler, Inhale 2 puffs into the lungs every 6 (six) hours as needed for wheezing or shortness of breath., Disp: 1 Inhaler, Rfl: 1 .  aspirin 325 MG tablet, Take 325 mg by mouth daily., Disp: , Rfl:  .  atorvastatin (LIPITOR) 40 MG tablet, Take 1 tablet (40 mg total) by mouth daily at 6 PM., Disp: 30 tablet, Rfl: 1 .  buPROPion (WELLBUTRIN SR) 100 MG 12 hr tablet, Take 100 mg by mouth 2 (two) times daily., Disp: , Rfl:  .  cetirizine (ZYRTEC) 10 MG tablet, Take 10 mg by mouth daily., Disp: , Rfl:  .  cyclobenzaprine (FLEXERIL) 10 MG tablet, Take 1 tablet (10 mg total) by mouth 3 (three) times daily as needed for muscle spasms., Disp: 90 tablet, Rfl: 0 .  divalproex (DEPAKOTE) 500 MG DR tablet, Take 500 mg by mouth 2 (two) times daily., Disp: , Rfl: 0 .  DULERA 200-5 MCG/ACT AERO, TAKE 2 PUFFS BY MOUTH TWICE A DAY, Disp: 1 Inhaler, Rfl: 1 .  HYDROcodone-acetaminophen (NORCO) 5-325 MG tablet, Take 1 tablet by mouth every 6 (six) hours as needed for severe pain. Max.: 4/day. Must Last 30 days., Disp: 120 tablet, Rfl: 0 .  isosorbide mononitrate (IMDUR) 30 MG 24 hr tablet, Take 1 tablet (30 mg total) by mouth daily. (Patient taking differently: Take 60 mg by mouth daily. ), Disp: 30 tablet, Rfl: 1 .  Melatonin 3 MG TABS, Take 3 mg by mouth at bedtime., Disp: , Rfl:  .  meloxicam (MOBIC) 7.5 MG tablet, Take 7.5 mg by mouth daily., Disp: , Rfl:  .  nitroGLYCERIN (NITROSTAT) 0.4 MG SL tablet, Place 1 tablet (0.4 mg total) under the tongue every 5 (five) minutes x 3 doses as needed for chest pain., Disp: 30 tablet, Rfl: 0 .  omeprazole (PRILOSEC) 40 MG capsule, Take 40 mg by mouth daily., Disp: , Rfl:  .  prazosin (MINIPRESS) 1 MG capsule, Take 1 capsule (1 mg total) by mouth at bedtime., Disp: 30 capsule, Rfl: 1 .  ranolazine (RANEXA) 500 MG 12 hr tablet, Take 500 mg by mouth 2 (two) times daily., Disp: , Rfl:  .  rOPINIRole (REQUIP) 0.5 MG  tablet, TAKE 1 TO 2  TABLETS BY MOUTH TWICE DAILY AS NEEDED FOR RESTLESS LEGS., Disp: , Rfl: 1 .  tamsulosin (FLOMAX) 0.4 MG CAPS capsule, Take 1 capsule (0.4 mg total) by mouth daily., Disp: 30 capsule, Rfl: 1 .  aspirin EC 81 MG tablet, Take 1 tablet (81 mg total) by mouth daily. (Patient not taking: Reported on 11/30/2017), Disp: 30 tablet, Rfl: 1 .  ticagrelor (BRILINTA) 60 MG TABS tablet, Take 60 mg by mouth 2 (two) times daily. , Disp: , Rfl:  .  ticagrelor (BRILINTA) 90 MG TABS tablet, Take 1 tablet (90 mg total) by mouth 2 (two) times daily. (Patient not taking: Reported on 11/30/2017), Disp: 60 tablet, Rfl: 0  ROS  Constitutional: Denies any fever or chills Gastrointestinal: No reported hemesis, hematochezia, vomiting, or acute GI distress Musculoskeletal: Denies any acute onset joint swelling, redness, loss of ROM, or weakness Neurological: No reported episodes of acute onset apraxia, aphasia, dysarthria, agnosia, amnesia, paralysis, loss of coordination, or loss of consciousness  Allergies  Mr. Polan is allergic to latuda [lurasidone hcl]; naproxen; and saphris [asenapine].  [place]  Drug: Mr. Mckillop  reports that he does not use drugs. Alcohol:  reports that he drinks alcohol. Tobacco:  reports that he has been smoking cigarettes.  He has been smoking about 0.50 packs per day. He has never used smokeless tobacco. Medical:  has a past medical history of Anxiety, Arthritis, Bursitis, COPD (chronic obstructive pulmonary disease) ([place]), Depression, GERD (gastroesophageal reflux disease), Hepatitis C (2012), Hypertension, Lung disease, MI (myocardial infarction) ([place]), and Stroke ([place]). Surgical: Mr. Vanallen  has a past surgical history that includes Appendectomy; Shoulder surgery (Right, 04/09/2012); Back surgery; Abdominal surgery; Colonoscopy; Cardiac catheterization (Left, 06/10/2016); Cardiac catheterization (N/A, 06/10/2016); Esophagogastroduodenoscopy (egd) with propofol (N/A, 01/05/2017);  Colonoscopy with propofol (N/A, 01/05/2017); Coronary angioplasty with stent; and Knee arthroscopy with medial menisectomy (Right, 09/05/2017). Family: family history includes Early death in his father; Heart disease in his father and mother; Heart failure in his father; Hypertension in his father, mother, and sister; Osteoarthritis in his mother.  Constitutional Exam  General appearance: Well nourished, well developed, and well hydrated. In no apparent acute distress Vitals:   11/30/17 1302  BP: (!) 164/96  Pulse: (!) 58  Resp: 18  Temp: 98.5 F (36.9 C)  TempSrc: Oral  SpO2: 100%  Weight: 210 lb (95.3 kg)  Height: _0  (1.905 m)   BMI Assessment: Estimated body mass index is 26.25 kg/m as calculated from the following:   Height as of this encounter: _1  (1.905 m).   Weight as of this encounter: 210 lb (95.3 kg).  BMI interpretation table: BMI level Category Range association with higher incidence of chronic pain  <18 kg/m2 Underweight   18.5-24.9 kg/m2 Ideal body weight   25-29.9 kg/m2 Overweight Increased incidence by 20%  30-34.9 kg/m2 Obese (Class I) Increased incidence by 68%  35-39.9 kg/m2 Severe obesity (Class II) Increased incidence by 136%  >40 kg/m2 Extreme obesity (Class III) Increased incidence by 254%   Patient's current BMI Ideal Body weight  Body mass index is 26.25 kg/m. Ideal body weight: 84.5 kg (186 lb 4.6 oz) Adjusted ideal body weight: 88.8 kg (195 lb 12.4 oz)   BMI Readings from Last 4 Encounters:  11/30/17 26.25 kg/m  11/27/17 28.12 kg/m  11/02/17 26.00 kg/m  11/01/17 25.25 kg/m   Wt Readings from Last 4 Encounters:  11/30/17 210 lb (95.3 kg)  11/27/17 225 lb (102.1 kg)  11/02/17 208 lb (94.3 kg)  11/01/17 202 lb (  91.6 kg)  Psych/Mental status: Alert, oriented x 3 (person, place, & time)       Eyes: PERLA Respiratory: No evidence of acute respiratory distress  Lumbar Spine Area Exam  Skin & Axial Inspection: Well healed scar from  previous spine surgery detected Alignment: Symmetrical Functional ROM: Unrestricted ROM       Stability: No instability detected Muscle Tone/Strength: Functionally intact. No obvious neuro-muscular anomalies detected. Sensory (Neurological): Unimpaired Palpation: Non-tender       Provocative Tests: Lumbar Hyperextension and rotation test: evaluation deferred today       Lumbar Lateral bending test: evaluation deferred today       Patrick's Maneuver: evaluation deferred today                    Gait & Posture Assessment  Ambulation: Unassisted Gait: Relatively normal for age and body habitus Posture: WNL   Lower Extremity Exam    Side: Right lower extremity  Side: Left lower extremity  Stability: No instability observed          Stability: No instability observed          Skin & Extremity Inspection: Skin color, temperature, and hair growth are WNL. No peripheral edema or cyanosis. No masses, redness, swelling, asymmetry, or associated skin lesions. No contractures.  Skin & Extremity Inspection: Skin color, temperature, and hair growth are WNL. No peripheral edema or cyanosis. No masses, redness, swelling, asymmetry, or associated skin lesions. No contractures. brace  Functional ROM: Unrestricted ROM                  Functional ROM: Unrestricted ROM                  Muscle Tone/Strength: Functionally intact. No obvious neuro-muscular anomalies detected.  Muscle Tone/Strength: Functionally intact. No obvious neuro-muscular anomalies detected.  Sensory (Neurological): Unimpaired  Sensory (Neurological): Unimpaired  Palpation: No palpable anomalies  Palpation: No palpable anomalies   Assessment  Primary Diagnosis & Pertinent Problem List: The primary encounter diagnosis was Spondylosis without myelopathy or radiculopathy, cervical region. Diagnoses of Chronic hip pain (Tertiary Area of Pain) (Bilateral) (R>L), Chronic knee pain (Primary Area of Pain)  (Bilateral) (R>L), Chronic  musculoskeletal pain, and Chronic pain syndrome were also pertinent to this visit.  Status Diagnosis  Controlled Persistent Persistent 1. Spondylosis without myelopathy or radiculopathy, cervical region   2. Chronic hip pain (Tertiary Area of Pain) (Bilateral) (R>L)   3. Chronic knee pain (Primary Area of Pain)  (Bilateral) (R>L)   4. Chronic musculoskeletal pain   5. Chronic pain syndrome     Problems updated and reviewed during this visit: No problems updated. Plan of Care  Pharmacotherapy (Medications Ordered): No orders of the defined types were placed in this encounter.  New Prescriptions   No medications on file   Medications administered today: Daegon W. Slezak had no medications administered during this visit. Lab-work, procedure(s), and/or referral(s): No orders of the defined types were placed in this encounter.  Imaging and/or referral(s): None   Interventional management options: Planned, scheduled, and/or pending:   Therapeutic left intra-articular Hyalgan knee injection #1, no fluoroscopy or IV sedation   Considering:   Diagnostic left intra-articular Hyalgan knee injection series  Diagnostic bilateral genicular nerve block Possiblebilateral genicular nerve RFA Diagnostic lumbar facet nerve block Possible bilateral lumbar facet RFA Diagnostic bilateral hip injections Diagnosticright suprascapular nerve block Possible right suprascapular nerve RFA Diagnosticbilateral cervical facet nerve block Possible bilateral cervical RFA   Palliative  PRN treatment(s):   None at this time    Provider-requested follow-up: Return in about 1 month (around 12/28/2017) for MedMgmt with Me Donella Stade Edison Pace).  Future Appointments  Date Time Provider [place]  12/07/2017  7:45 AM Milinda Pointer, MD [facility] None   Primary Care Physician: Jodi Marble, MD Location: [facility] Note by: Vevelyn Francois NP Date: 11/30/2017;  Time: 1:28 PM  Pain Score Disclaimer: We use the NRS-11 scale. This is a self-reported, subjective measurement of pain severity with only modest accuracy. It is used primarily to identify changes within a particular patient. It must be understood that outpatient pain scales are significantly less accurate that those used for research, where they can be applied under ideal controlled circumstances with minimal exposure to variables. In reality, the score is likely to be a combination of pain intensity and pain affect, where pain affect describes the degree of emotional arousal or changes in action readiness caused by the sensory experience of pain. Factors such as social and work situation, setting, emotional state, anxiety levels, expectation, and prior pain experience may influence pain perception and show large inter-individual differences that may also be affected by time variables.  Patient instructions provided during this appointment: Patient Instructions  ____________________________________________________________________________________________  Medication Rules  Applies to: All patients receiving prescriptions (written or electronic).  Pharmacy of record: Pharmacy where electronic prescriptions will be sent. If written prescriptions are taken to a different pharmacy, please inform the nursing staff. The pharmacy listed in the electronic medical record should be the one where you would like electronic prescriptions to be sent.  Prescription refills: Only during scheduled appointments. Applies to both, written and electronic prescriptions.  NOTE: The following applies primarily to controlled substances (Opioid* Pain Medications).   Patient's responsibilities: 1. Pain Pills: Bring all pain pills to every appointment (except for procedure appointments). 2. Pill Bottles: Bring pills in original pharmacy bottle. Always bring newest bottle. Bring bottle, even if empty. 3. Medication refills:  You are responsible for knowing and keeping track of what medications you need refilled. The day before your appointment, write a list of all prescriptions that need to be refilled. Bring that list to your appointment and give it to the admitting nurse. Prescriptions will be written only during appointments. If you forget a medication, it will not be "Called in", "Faxed", or "electronically sent". You will need to get another appointment to get these prescribed. 4. Prescription Accuracy: You are responsible for carefully inspecting your prescriptions before leaving our office. Have the discharge nurse carefully go over each prescription with you, before taking them home. Make sure that your name is accurately spelled, that your address is correct. Check the name and dose of your medication to make sure it is accurate. Check the number of pills, and the written instructions to make sure they are clear and accurate. Make sure that you are given enough medication to last until your next medication refill appointment. 5. Taking Medication: Take medication as prescribed. Never take more pills than instructed. Never take medication more frequently than prescribed. Taking less pills or less frequently is permitted and encouraged, when it comes to controlled substances (written prescriptions).  6. Inform other Doctors: Always inform, all of your healthcare providers, of all the medications you take. 7. Pain Medication from other Providers: You are not allowed to accept any additional pain medication from any other Doctor or Healthcare provider. There are two exceptions to this rule. (see below) In the event  that you require additional pain medication, you are responsible for notifying us, as stated below. 8. Medication Agreement: You are responsible for carefully reading and following our Medication Agreement. This must be signed before receiving any prescriptions from our practice. Safely store a copy of your signed  Agreement. Violations to the Agreement will result in no further prescriptions. (Additional copies of our Medication Agreement are available upon request.) 9. Laws, Rules, & Regulations: All patients are expected to follow all Federal and [employer], Rules, [employer]. Ignorance of the Laws does not constitute a valid excuse. The use of any illegal substances is prohibited. 10. Adopted CDC guidelines & recommendations: Target dosing levels will be at or below 60 MME/day. Use of benzodiazepines** is not recommended.  Exceptions: There are only two exceptions to the rule of not receiving pain medications from other Healthcare Providers. 1. Exception #1 (Emergencies): In the event of an emergency (i.e.: accident requiring emergency care), you are allowed to receive additional pain medication. However, you are responsible for: As soon as you are able, call our office [phone], at any time of the day or night, and leave a message stating your name, the date and nature of the emergency, and the name and dose of the medication prescribed. In the event that your call is answered by a member of our staff, make sure to document and save the date, time, and the name of the person that took your information.  2. Exception #2 (Planned Surgery): In the event that you are scheduled by another doctor or dentist to have any type of surgery or procedure, you are allowed (for a period no longer than 30 days), to receive additional pain medication, for the acute post-op pain. However, in this case, you are responsible for picking up a copy of our "Post-op Pain Management for Surgeons" handout, and giving it to your surgeon or dentist. This document is available at our office, and does not require an appointment to obtain it. Simply go to our office during business hours (Monday-Thursday from 8:00 AM to 4:00 PM) (Friday 8:00 AM to 12:00 Noon) or if you have a scheduled appointment with [place], prior to your surgery,  and ask for it by name. In addition, you will need to provide [place] with your name, name of your surgeon, type of surgery, and date of procedure or surgery.  *Opioid medications include: morphine, codeine, oxycodone, oxymorphone, hydrocodone, hydromorphone, meperidine, tramadol, tapentadol, buprenorphine, fentanyl, methadone. **Benzodiazepine medications include: diazepam (Valium), alprazolam (Xanax), clonazepam (Klonopine), lorazepam (Ativan), clorazepate (Tranxene), chlordiazepoxide (Librium), estazolam (Prosom), oxazepam (Serax), temazepam (Restoril), triazolam (Halcion) (Last updated: 09/21/2017) ____________________________________________________________________________________________

## 2017-11-30 NOTE — Progress Notes (Signed)
Nursing Pain Medication Assessment:  Safety precautions to be maintained throughout the outpatient stay will include: orient to surroundings, keep bed in low position, maintain call bell within reach at all times, provide assistance with transfer out of bed and ambulation.  Medication Inspection Compliance: Pill count conducted under aseptic conditions, in front of the patient. Neither the pills nor the bottle was removed from the patient's sight at any time. Once count was completed pills were immediately returned to the patient in their original bottle.  Medication: Hydrocodone/APAP Pill/Patch Count: 3 of 120 pills remain Pill/Patch Appearance: Markings consistent with prescribed medication Bottle Appearance: Standard pharmacy container. Clearly labeled. Filled Date: 04 / 10 / 2019 Last Medication intake:  Today

## 2017-12-07 ENCOUNTER — Ambulatory Visit: Payer: Medicaid Other | Attending: Pain Medicine | Admitting: Pain Medicine

## 2017-12-07 ENCOUNTER — Other Ambulatory Visit: Payer: Self-pay

## 2017-12-07 ENCOUNTER — Encounter: Payer: Self-pay | Admitting: Pain Medicine

## 2017-12-07 VITALS — BP 127/77 | HR 55 | Temp 97.8°F | Resp 18 | Ht 75.0 in | Wt 210.0 lb

## 2017-12-07 DIAGNOSIS — M1712 Unilateral primary osteoarthritis, left knee: Secondary | ICD-10-CM | POA: Insufficient documentation

## 2017-12-07 DIAGNOSIS — M25562 Pain in left knee: Secondary | ICD-10-CM

## 2017-12-07 DIAGNOSIS — Z886 Allergy status to analgesic agent status: Secondary | ICD-10-CM | POA: Diagnosis not present

## 2017-12-07 DIAGNOSIS — Z955 Presence of coronary angioplasty implant and graft: Secondary | ICD-10-CM | POA: Diagnosis not present

## 2017-12-07 DIAGNOSIS — G8929 Other chronic pain: Secondary | ICD-10-CM | POA: Diagnosis not present

## 2017-12-07 DIAGNOSIS — Z7982 Long term (current) use of aspirin: Secondary | ICD-10-CM | POA: Insufficient documentation

## 2017-12-07 DIAGNOSIS — Z79899 Other long term (current) drug therapy: Secondary | ICD-10-CM | POA: Diagnosis not present

## 2017-12-07 DIAGNOSIS — M25561 Pain in right knee: Secondary | ICD-10-CM | POA: Diagnosis not present

## 2017-12-07 MED ORDER — LIDOCAINE HCL (PF) 1 % IJ SOLN
4.0000 mL | Freq: Once | INTRAMUSCULAR | Status: AC
  Administered 2017-12-07: 5 mL

## 2017-12-07 MED ORDER — ROPIVACAINE HCL 2 MG/ML IJ SOLN
4.0000 mL | Freq: Once | INTRAMUSCULAR | Status: AC
  Administered 2017-12-07: 10 mL via INTRA_ARTICULAR

## 2017-12-07 MED ORDER — ROPIVACAINE HCL 2 MG/ML IJ SOLN
INTRAMUSCULAR | Status: AC
  Filled 2017-12-07: qty 10

## 2017-12-07 MED ORDER — LIDOCAINE HCL (PF) 1 % IJ SOLN
INTRAMUSCULAR | Status: AC
  Filled 2017-12-07: qty 5

## 2017-12-07 MED ORDER — SODIUM HYALURONATE (VISCOSUP) 20 MG/2ML IX SOSY
2.0000 mL | PREFILLED_SYRINGE | Freq: Once | INTRA_ARTICULAR | Status: AC
  Administered 2017-12-07: 2 mL via INTRA_ARTICULAR

## 2017-12-07 NOTE — Progress Notes (Signed)
Patient's Name: Keith Gibbs  MRN: [id number]  Referring Provider: Jodi Marble, MD  DOB: 04-30-1961  PCP: Jodi Marble, MD  DOS: 12/07/2017  Note by: Gaspar Cola, MD  Service setting: Ambulatory outpatient  Specialty: Interventional Pain Management  Patient type: Established  Location: [facility] (AMB) Pain Management Facility  Visit type: Interventional Procedure   Primary Reason for Visit: Interventional Pain Management Treatment. CC: Knee Pain (both )  Procedure:  Anesthesia, Analgesia, Anxiolysis:  Type: Therapeutic Intra-Articular Hyalgan Knee Injection #1  Region: Lateral infrapatellar Knee Region Level: Knee Joint Laterality: Left knee  Type: Local Anesthesia Indication(s): Analgesia         Local Anesthetic: Lidocaine 1-2% Route: Infiltration (Maud/IM) IV Access: Declined Sedation: Declined    Indications: 1. Osteoarthritis of the knee (Left)   2. Chronic knee pain (Primary Area of Pain)  (Bilateral) (R>L)    Pain Score: Pre-procedure: 2 /10 Post-procedure: 0-No pain/10  Pre-op Assessment:  [name] is a 57 y.o. (year old), male patient, seen today for interventional treatment. He  has a past surgical history that includes Appendectomy; Shoulder surgery (Right, 04/09/2012); Back surgery; Abdominal surgery; Colonoscopy; Cardiac catheterization (Left, 06/10/2016); Cardiac catheterization (N/A, 06/10/2016); Esophagogastroduodenoscopy (egd) with propofol (N/A, 01/05/2017); Colonoscopy with propofol (N/A, 01/05/2017); Coronary angioplasty with stent; and Knee arthroscopy with medial menisectomy (Right, 09/05/2017). [name] has a current medication list which includes the following prescription(s): acetaminophen, albuterol, aspirin ec, atorvastatin, bupropion, celecoxib, cetirizine, cyclobenzaprine, divalproex, dulera, hydrocodone-acetaminophen, isosorbide mononitrate, melatonin, meloxicam, nitroglycerin, omeprazole, prazosin, ranolazine, ropinirole, tamsulosin,  ticagrelor, and ticagrelor. His primarily concern today is the Knee Pain (both )  Initial Vital Signs:  Pulse/HCG Rate: (!) 55  Temp: 97.8 F (36.6 C) Resp: 18 BP: 127/77 SpO2: 99 %  BMI: Estimated body mass index is 26.25 kg/m as calculated from the following:   Height as of this encounter: 6\' 3"  (1.905 m).   Weight as of this encounter: 210 lb (95.3 kg).  Risk Assessment: Allergies: Reviewed. He is allergic to latuda [lurasidone hcl]; naproxen; and saphris [asenapine].  Allergy Precautions: None required Coagulopathies: Reviewed. None identified.  Blood-thinner therapy: None at this time Active Infection(s): Reviewed. None identified. [name] is afebrile  Site Confirmation: [name] was asked to confirm the procedure and laterality before marking the site Procedure checklist: Completed Consent: Before the procedure and under the influence of no sedative(s), amnesic(s), or anxiolytics, the patient was informed of the treatment options, risks and possible complications. To fulfill our ethical and legal obligations, as recommended by the American Medical Association's Code of Ethics, I have informed the patient of my clinical impression; the nature and purpose of the treatment or procedure; the risks, benefits, and possible complications of the intervention; the alternatives, including doing nothing; the risk(s) and benefit(s) of the alternative treatment(s) or procedure(s); and the risk(s) and benefit(s) of doing nothing. The patient was provided information about the general risks and possible complications associated with the procedure. These may include, but are not limited to: failure to achieve desired goals, infection, bleeding, organ or nerve damage, allergic reactions, paralysis, and death. In addition, the patient was informed of those risks and complications associated to the procedure, such as failure to decrease pain; infection; bleeding; organ or nerve damage with  subsequent damage to sensory, motor, and/or autonomic systems, resulting in permanent pain, numbness, and/or weakness of one or several areas of the body; allergic reactions; (i.e.: anaphylactic reaction); and/or death. Furthermore, the patient was informed of those risks and complications associated  with the medications. These include, but are not limited to: allergic reactions (i.e.: anaphylactic or anaphylactoid reaction(s)); adrenal axis suppression; blood sugar elevation that in diabetics may result in ketoacidosis or comma; water retention that in patients with history of congestive heart failure may result in shortness of breath, pulmonary edema, and decompensation with resultant heart failure; weight gain; swelling or edema; medication-induced neural toxicity; particulate matter embolism and blood vessel occlusion with resultant organ, and/or nervous system infarction; and/or aseptic necrosis of one or more joints. Finally, the patient was informed that Medicine is not an exact science; therefore, there is also the possibility of unforeseen or unpredictable risks and/or possible complications that may result in a catastrophic outcome. The patient indicated having understood very clearly. We have given the patient no guarantees and we have made no promises. Enough time was given to the patient to ask questions, all of which were answered to the patient's satisfaction. [name] has indicated that he wanted to continue with the procedure. Attestation: I, the ordering provider, attest that I have discussed with the patient the benefits, risks, side-effects, alternatives, likelihood of achieving goals, and potential problems during recovery for the procedure that I have provided informed consent. Date  Time: 12/07/2017  8:05 AM  Pre-Procedure Preparation:  Monitoring: As per clinic protocol. Respiration, ETCO2, SpO2, BP, heart rate and rhythm monitor placed and checked for adequate function Safety  Precautions: Patient was assessed for positional comfort and pressure points before starting the procedure. Time-out: I initiated and conducted the "Time-out" before starting the procedure, as per protocol. The patient was asked to participate by confirming the accuracy of the "Time Out" information. Verification of the correct person, site, and procedure were performed and confirmed by me, the nursing staff, and the patient. "Time-out" conducted as per Joint Commission's Universal Protocol (UP.01.01.01). Time: 0851  Description of Procedure:       Position: Sitting Target Area: Knee Joint Approach: Just above the Lateral tibial plateau, lateral to the infrapatellar tendon. Area Prepped: Entire knee area, from the mid-thigh to the mid-shin. Prepping solution: ChloraPrep (2% chlorhexidine gluconate and 70% isopropyl alcohol) Safety Precautions: Aspiration looking for blood return was conducted prior to all injections. At no point did we inject any substances, as a needle was being advanced. No attempts were made at seeking any paresthesias. Safe injection practices and needle disposal techniques used. Medications properly checked for expiration dates. SDV (single dose vial) medications used. Description of the Procedure: Protocol guidelines were followed. The patient was placed in position over the fluoroscopy table. The target area was identified and the area prepped in the usual manner. Skin desensitized using vapocoolant spray. Skin & deeper tissues infiltrated with local anesthetic. Appropriate amount of time allowed to pass for local anesthetics to take effect. The procedure needles were then advanced to the target area. Proper needle placement secured. Negative aspiration confirmed. Solution injected in intermittent fashion, asking for systemic symptoms every 0.5cc of injectate. The needles were then removed and the area cleansed, making sure to leave some of the prepping solution back to take  advantage of its long term bactericidal properties. Vitals:   12/07/17 0801  BP: 127/77  Pulse: (!) 55  Resp: 18  Temp: 97.8 F (36.6 C)  TempSrc: Oral  SpO2: 99%  Weight: 210 lb (95.3 kg)  Height: 6\' 3"  (1.905 m)    Start Time: 0851 hrs. End Time: 0852 hrs. Materials:  Needle(s) Type: Regular needle Gauge: 22G Length: 3.5-in Medication(s): Please see orders for medications  and dosing details.  Imaging Guidance:  Type of Imaging Technique: None used Indication(s): N/A Exposure Time: No patient exposure Contrast: None used. Fluoroscopic Guidance: N/A Ultrasound Guidance: N/A Interpretation: N/A  Antibiotic Prophylaxis:   Anti-infectives (From admission, onward)   None     Indication(s): None identified  Post-operative Assessment:  Post-procedure Vital Signs:  Pulse/HCG Rate: (!) 55  Temp: 97.8 F (36.6 C) Resp: 18 BP: 127/77 SpO2: 99 %  EBL: None  Complications: No immediate post-treatment complications observed by team, or reported by patient.  Note: The patient tolerated the entire procedure well. A repeat set of vitals were taken after the procedure and the patient was kept under observation following institutional policy, for this type of procedure. Post-procedural neurological assessment was performed, showing return to baseline, prior to discharge. The patient was provided with post-procedure discharge instructions, including a section on how to identify potential problems. Should any problems arise concerning this procedure, the patient was given instructions to immediately contact us, at any time, without hesitation. In any case, we plan to contact the patient by telephone for a follow-up status report regarding this interventional procedure.  Comments:  No additional relevant information.  Plan of Care   Imaging Orders  No imaging studies ordered today    Procedure Orders     KNEE INJECTION     KNEE INJECTION  Medications ordered for  procedure: Meds ordered this encounter  Medications  . lidocaine (PF) (XYLOCAINE) 1 % injection 4 mL  . ropivacaine (PF) 2 mg/mL (0.2%) (NAROPIN) injection 4 mL  . Sodium Hyaluronate SOSY 2 mL   Medications administered: We administered lidocaine (PF), ropivacaine (PF) 2 mg/mL (0.2%), and Sodium Hyaluronate.  See the medical record for exact dosing, route, and time of administration.  New Prescriptions   No medications on file   Disposition: Discharge home  Discharge Date & Time: 12/07/2017; 0900 hrs.   Physician-requested Follow-up: Return for PPE (2 wks) + Procedure (no sedation): (L) Hyalgan  #2.  Future Appointments  Date Time Provider [place]  12/26/2017  9:00 AM Milinda Pointer, MD [facility] None  12/28/2017 11:00 AM Vevelyn Francois, NP [facility] [place] None   Primary Care Physician: Jodi Marble, MD Location: [facility] Note by: Gaspar Cola, MD Date: 12/07/2017; Time: 10:07 AM  Disclaimer:  Medicine is not an Chief Strategy Officer. The only guarantee in medicine is that nothing is guaranteed. It is important to note that the decision to proceed with this intervention was based on the information collected from the patient. The Data and conclusions were drawn from the patient's questionnaire, the interview, and the physical examination. Because the information was provided in large part by the patient, it cannot be guaranteed that it has not been purposely or unconsciously manipulated. Every effort has been made to obtain as much relevant data as possible for this evaluation. It is important to note that the conclusions that lead to this procedure are derived in large part from the available data. Always take into account that the treatment will also be dependent on availability of resources and existing treatment guidelines, considered by other Pain Management Practitioners as being common knowledge and practice, at the time of the  intervention. For Medico-Legal purposes, it is also important to point out that variation in procedural techniques and pharmacological choices are the acceptable norm. The indications, contraindications, technique, and results of the above procedure should only be interpreted and judged by a Board-Certified Interventional Pain Specialist with extensive familiarity  and expertise in the same exact procedure and technique.

## 2017-12-07 NOTE — Patient Instructions (Signed)
____________________________________________________________________________________________  Post-Procedure Discharge Instructions  Instructions:  Apply ice: Fill a plastic sandwich bag with crushed ice. Cover it with a small towel and apply to injection site. Apply for 15 minutes then remove x 15 minutes. Repeat sequence on day of procedure, until you go to bed. The purpose is to minimize swelling and discomfort after procedure.  Apply heat: Apply heat to procedure site starting the day following the procedure. The purpose is to treat any soreness and discomfort from the procedure.  Food intake: Start with clear liquids (like water) and advance to regular food, as tolerated.   Physical activities: Keep activities to a minimum for the first 8 hours after the procedure.   Driving: If you have received any sedation, you are not allowed to drive for 24 hours after your procedure.  Blood thinner: Restart your blood thinner 6 hours after your procedure. (Only for those taking blood thinners)  Insulin: As soon as you can eat, you may resume your normal dosing schedule. (Only for those taking insulin)  Infection prevention: Keep procedure site clean and dry.  Post-procedure Pain Diary: Extremely important that this be done correctly and accurately. Recorded information will be used to determine the next step in treatment.  Pain evaluated is that of treated area only. Do not include pain from an untreated area.  Complete every hour, on the hour, for the initial 8 hours. Set an alarm to help you do this part accurately.  Do not go to sleep and have it completed later. It will not be accurate.  Follow-up appointment: Keep your follow-up appointment after the procedure. Usually 2 weeks for most procedures. (6 weeks in the case of radiofrequency.) Bring you pain diary.   Expect:  From numbing medicine (AKA: Local Anesthetics): Numbness or decrease in pain.  Onset: Full effect within 15  minutes of injected.  Duration: It will depend on the type of local anesthetic used. On the average, 1 to 8 hours.   From steroids: Decrease in swelling or inflammation. Once inflammation is improved, relief of the pain will follow.  Onset of benefits: Depends on the amount of swelling present. The more swelling, the longer it will take for the benefits to be seen. In some cases, up to 10 days.  Duration: Steroids will stay in the system x 2 weeks. Duration of benefits will depend on multiple posibilities including persistent irritating factors.  From procedure: Some discomfort is to be expected once the numbing medicine wears off. This should be minimal if ice and heat are applied as instructed.  Call if:  You experience numbness and weakness that gets worse with time, as opposed to wearing off.  New onset bowel or bladder incontinence. (This applies to Spinal procedures only)  Emergency Numbers:  Durning business hours (Monday - Thursday, 8:00 AM - 4:00 PM) (Friday, 9:00 AM - 12:00 Noon): [phone]  After hours: [phone] ____________________________________________________________________________________________   ____________________________________________________________________________________________  Preparing for your procedure (without sedation)  Instructions: . Oral Intake: Do not eat or drink anything for at least 3 hours prior to your procedure. . Transportation: Unless otherwise stated by your physician, you may drive yourself after the procedure. . Blood Pressure Medicine: Take your blood pressure medicine with a sip of water the morning of the procedure. . Blood thinners:  . Diabetics on insulin: Notify the staff so that you can be scheduled 1st case in the morning. If your diabetes requires high dose insulin, take only  of your normal insulin dose   the morning of the procedure and notify the staff that you have done so. . Preventing infections: Shower  with an antibacterial soap the morning of your procedure.  . Build-up your immune system: Take 1000 mg of Vitamin C with every meal (3 times a day) the day prior to your procedure. . Antibiotics: Inform the staff if you have a condition or reason that requires you to take antibiotics before dental procedures. . Pregnancy: If you are pregnant, call and cancel the procedure. . Sickness: If you have a cold, fever, or any active infections, call and cancel the procedure. . Arrival: You must be in the facility at least 30 minutes prior to your scheduled procedure. . Children: Do not bring any children with you. . Dress appropriately: Bring dark clothing that you would not mind if they get stained. . Valuables: Do not bring any jewelry or valuables.  Procedure appointments are reserved for interventional treatments only. . No Prescription Refills. . No medication changes will be discussed during procedure appointments. . No disability issues will be discussed.  Remember:  Regular Business hours are:  Monday to Thursday 8:00 AM to 4:00 PM  Provider's Schedule: Keith Mutch, MD:  Procedure days: Tuesday and Thursday 7:30 AM to 4:00 PM  Keith Lateef, MD:  Procedure days: Monday and Wednesday 7:30 AM to 4:00 PM ____________________________________________________________________________________________    

## 2017-12-08 ENCOUNTER — Telehealth: Payer: Self-pay

## 2017-12-08 NOTE — Telephone Encounter (Signed)
Post procedure phone call.  Patient states he is doing OK.

## 2017-12-26 ENCOUNTER — Other Ambulatory Visit: Payer: Self-pay

## 2017-12-26 ENCOUNTER — Encounter: Payer: Self-pay | Admitting: Pain Medicine

## 2017-12-26 ENCOUNTER — Ambulatory Visit: Payer: Medicaid Other | Attending: Pain Medicine | Admitting: Pain Medicine

## 2017-12-26 VITALS — BP 125/87 | HR 55 | Temp 98.2°F | Resp 18 | Ht 75.0 in | Wt 220.0 lb

## 2017-12-26 DIAGNOSIS — M1712 Unilateral primary osteoarthritis, left knee: Secondary | ICD-10-CM | POA: Diagnosis not present

## 2017-12-26 DIAGNOSIS — Z955 Presence of coronary angioplasty implant and graft: Secondary | ICD-10-CM | POA: Insufficient documentation

## 2017-12-26 DIAGNOSIS — G8929 Other chronic pain: Secondary | ICD-10-CM | POA: Diagnosis not present

## 2017-12-26 DIAGNOSIS — M25562 Pain in left knee: Secondary | ICD-10-CM | POA: Insufficient documentation

## 2017-12-26 DIAGNOSIS — M25561 Pain in right knee: Secondary | ICD-10-CM | POA: Diagnosis not present

## 2017-12-26 MED ORDER — ROPIVACAINE HCL 2 MG/ML IJ SOLN
4.0000 mL | Freq: Once | INTRAMUSCULAR | Status: AC
  Administered 2017-12-26: 10 mL via INTRA_ARTICULAR

## 2017-12-26 MED ORDER — LIDOCAINE HCL (PF) 1 % IJ SOLN
4.0000 mL | Freq: Once | INTRAMUSCULAR | Status: AC
  Administered 2017-12-26: 5 mL

## 2017-12-26 MED ORDER — LIDOCAINE HCL (PF) 1 % IJ SOLN
INTRAMUSCULAR | Status: AC
  Filled 2017-12-26: qty 5

## 2017-12-26 MED ORDER — SODIUM HYALURONATE (VISCOSUP) 20 MG/2ML IX SOSY
2.0000 mL | PREFILLED_SYRINGE | Freq: Once | INTRA_ARTICULAR | Status: AC
  Administered 2017-12-26: 2 mL via INTRA_ARTICULAR

## 2017-12-26 MED ORDER — ROPIVACAINE HCL 2 MG/ML IJ SOLN
INTRAMUSCULAR | Status: AC
  Filled 2017-12-26: qty 10

## 2017-12-26 NOTE — Progress Notes (Signed)
Safety precautions to be maintained throughout the outpatient stay will include: orient to surroundings, keep bed in low position, maintain call bell within reach at all times, provide assistance with transfer out of bed and ambulation.  

## 2017-12-26 NOTE — Patient Instructions (Signed)
____________________________________________________________________________________________  Post-Procedure Discharge Instructions  Instructions:  Apply ice: Fill a plastic sandwich bag with crushed ice. Cover it with a small towel and apply to injection site. Apply for 15 minutes then remove x 15 minutes. Repeat sequence on day of procedure, until you go to bed. The purpose is to minimize swelling and discomfort after procedure.  Apply heat: Apply heat to procedure site starting the day following the procedure. The purpose is to treat any soreness and discomfort from the procedure.  Food intake: Start with clear liquids (like water) and advance to regular food, as tolerated.   Physical activities: Keep activities to a minimum for the first 8 hours after the procedure.   Driving: If you have received any sedation, you are not allowed to drive for 24 hours after your procedure.  Blood thinner: Restart your blood thinner 6 hours after your procedure. (Only for those taking blood thinners)  Insulin: As soon as you can eat, you may resume your normal dosing schedule. (Only for those taking insulin)  Infection prevention: Keep procedure site clean and dry.  Post-procedure Pain Diary: Extremely important that this be done correctly and accurately. Recorded information will be used to determine the next step in treatment.  Pain evaluated is that of treated area only. Do not include pain from an untreated area.  Complete every hour, on the hour, for the initial 8 hours. Set an alarm to help you do this part accurately.  Do not go to sleep and have it completed later. It will not be accurate.  Follow-up appointment: Keep your follow-up appointment after the procedure. Usually 2 weeks for most procedures. (6 weeks in the case of radiofrequency.) Bring you pain diary.   Expect:  From numbing medicine (AKA: Local Anesthetics): Numbness or decrease in pain.  Onset: Full effect within 15  minutes of injected.  Duration: It will depend on the type of local anesthetic used. On the average, 1 to 8 hours.   From steroids: Decrease in swelling or inflammation. Once inflammation is improved, relief of the pain will follow.  Onset of benefits: Depends on the amount of swelling present. The more swelling, the longer it will take for the benefits to be seen. In some cases, up to 10 days.  Duration: Steroids will stay in the system x 2 weeks. Duration of benefits will depend on multiple posibilities including persistent irritating factors.  From procedure: Some discomfort is to be expected once the numbing medicine wears off. This should be minimal if ice and heat are applied as instructed.  Call if:  You experience numbness and weakness that gets worse with time, as opposed to wearing off.  New onset bowel or bladder incontinence. (This applies to Spinal procedures only)  Emergency Numbers:  Durning business hours (Monday - Thursday, 8:00 AM - 4:00 PM) (Friday, 9:00 AM - 12:00 Noon): [phone]  After hours: [phone] ____________________________________________________________________________________________   ____________________________________________________________________________________________  Preparing for your procedure (without sedation)  Instructions: . Oral Intake: Do not eat or drink anything for at least 3 hours prior to your procedure. . Transportation: Unless otherwise stated by your physician, you may drive yourself after the procedure. . Blood Pressure Medicine: Take your blood pressure medicine with a sip of water the morning of the procedure. . Blood thinners:  . Diabetics on insulin: Notify the staff so that you can be scheduled 1st case in the morning. If your diabetes requires high dose insulin, take only  of your normal insulin dose   the morning of the procedure and notify the staff that you have done so. . Preventing infections: Shower  with an antibacterial soap the morning of your procedure.  . Build-up your immune system: Take 1000 mg of Vitamin C with every meal (3 times a day) the day prior to your procedure. . Antibiotics: Inform the staff if you have a condition or reason that requires you to take antibiotics before dental procedures. . Pregnancy: If you are pregnant, call and cancel the procedure. . Sickness: If you have a cold, fever, or any active infections, call and cancel the procedure. . Arrival: You must be in the facility at least 30 minutes prior to your scheduled procedure. . Children: Do not bring any children with you. . Dress appropriately: Bring dark clothing that you would not mind if they get stained. . Valuables: Do not bring any jewelry or valuables.  Procedure appointments are reserved for interventional treatments only. . No Prescription Refills. . No medication changes will be discussed during procedure appointments. . No disability issues will be discussed.  Remember:  Regular Business hours are:  Monday to Thursday 8:00 AM to 4:00 PM  Provider's Schedule: Joscelyn Hardrick, MD:  Procedure days: Tuesday and Thursday 7:30 AM to 4:00 PM  Bilal Lateef, MD:  Procedure days: Monday and Wednesday 7:30 AM to 4:00 PM ____________________________________________________________________________________________    

## 2017-12-26 NOTE — Progress Notes (Signed)
Patient's Name: Keith Gibbs  MRN: [id number]  Referring Provider: Jodi Marble, MD  DOB: [birth year]  PCP: Jodi Marble, MD  DOS: 12/26/2017  Note by: Gaspar Cola, MD  Service setting: Ambulatory outpatient  Specialty: Interventional Pain Management  Patient type: Established  Location: [facility] (AMB) Pain Management Facility  Visit type: Interventional Procedure   Primary Reason for Visit: Interventional Pain Management Treatment. CC: Procedure (L hyalgan #2)  Procedure:  Anesthesia, Analgesia, Anxiolysis:  Type: Therapeutic Intra-Articular Hyalgan Knee Injection #2  Region: Lateral infrapatellar Knee Region Level: Knee Joint Laterality: Left knee  Type: Local Anesthesia Indication(s): Analgesia         Local Anesthetic: Lidocaine 1-2% Route: Infiltration (Keith Gibbs) IV Access: Declined Sedation: Declined    Indications: 1. Osteoarthritis of the knee (Left)   2. Chronic knee pain (Primary Area of Pain)  (Bilateral) (R>L)    Pain Score: Pre-procedure: 2 /10 Post-procedure: 0-No pain/10  Pre-op Assessment:  Keith Gibbs is a 57 y.o. (year old), male patient, seen today for interventional treatment. He  has a past surgical history that includes Appendectomy; Shoulder surgery (Right, 04/09/2012); Back surgery; Abdominal surgery; Colonoscopy; Cardiac catheterization (Left, 06/10/2016); Cardiac catheterization (N/A, 06/10/2016); Esophagogastroduodenoscopy (egd) with propofol (N/A, 01/05/2017); Colonoscopy with propofol (N/A, 01/05/2017); Coronary angioplasty with stent; and Knee arthroscopy with medial menisectomy (Right, 09/05/2017). Keith Gibbs has a current medication list which includes the following prescription(s): acetaminophen, albuterol, aspirin ec, atorvastatin, bupropion, celecoxib, cetirizine, cyclobenzaprine, divalproex, dulera, hydrocodone-acetaminophen, isosorbide mononitrate, melatonin, meloxicam, nitroglycerin, omeprazole, prazosin, ranolazine, ropinirole, tamsulosin,  ticagrelor, and ticagrelor. His primarily concern today is the Procedure (L hyalgan #2)  Initial Vital Signs:  Pulse/HCG Rate: (!) 56  Temp: 98.2 F (36.8 C) Resp: 18 BP: 137/86 SpO2: 100 %  BMI: Estimated body mass index is 27.5 kg/m as calculated from the following:   Height as of this encounter: 6\' 3"  (1.905 m).   Weight as of this encounter: 220 lb (99.8 kg).  Risk Assessment: Allergies: Reviewed. He is allergic to latuda [lurasidone hcl]; naproxen; and saphris [asenapine].  Allergy Precautions: None required Coagulopathies: Reviewed. None identified.  Blood-thinner therapy: None at this time Active Infection(s): Reviewed. None identified. Keith Gibbs is afebrile  Site Confirmation: Keith Gibbs was asked to confirm the procedure and laterality before marking the site Procedure checklist: Completed Consent: Before the procedure and under the influence of no sedative(s), amnesic(s), or anxiolytics, the patient was informed of the treatment options, risks and possible complications. To fulfill our ethical and legal obligations, as recommended by the American Medical Association's Code of Ethics, I have informed the patient of my clinical impression; the nature and purpose of the treatment or procedure; the risks, benefits, and possible complications of the intervention; the alternatives, including doing nothing; the risk(s) and benefit(s) of the alternative treatment(s) or procedure(s); and the risk(s) and benefit(s) of doing nothing. The patient was provided information about the general risks and possible complications associated with the procedure. These may include, but are not limited to: failure to achieve desired goals, infection, bleeding, organ or nerve damage, allergic reactions, paralysis, and death. In addition, the patient was informed of those risks and complications associated to the procedure, such as failure to decrease pain; infection; bleeding; organ or nerve damage with  subsequent damage to sensory, motor, and/or autonomic systems, resulting in permanent pain, numbness, and/or weakness of one or several areas of the body; allergic reactions; (i.e.: anaphylactic reaction); and/or death. Furthermore, the patient was informed of those risks and complications associated  with the medications. These include, but are not limited to: allergic reactions (i.e.: anaphylactic or anaphylactoid reaction(s)); adrenal axis suppression; blood sugar elevation that in diabetics may result in ketoacidosis or comma; water retention that in patients with history of congestive heart failure may result in shortness of breath, pulmonary edema, and decompensation with resultant heart failure; weight gain; swelling or edema; medication-induced neural toxicity; particulate matter embolism and blood vessel occlusion with resultant organ, and/or nervous system infarction; and/or aseptic necrosis of one or more joints. Finally, the patient was informed that Medicine is not an exact science; therefore, there is also the possibility of unforeseen or unpredictable risks and/or possible complications that may result in a catastrophic outcome. The patient indicated having understood very clearly. We have given the patient no guarantees and we have made no promises. Enough time was given to the patient to ask questions, all of which were answered to the patient's satisfaction. Keith Gibbs has indicated that he wanted to continue with the procedure. Attestation: I, the ordering provider, attest that I have discussed with the patient the benefits, risks, side-effects, alternatives, likelihood of achieving goals, and potential problems during recovery for the procedure that I have provided informed consent. Date  Time: 12/26/2017  8:59 AM  Pre-Procedure Preparation:  Monitoring: As per clinic protocol. Respiration, ETCO2, SpO2, BP, heart rate and rhythm monitor placed and checked for adequate function Safety  Precautions: Patient was assessed for positional comfort and pressure points before starting the procedure. Time-out: I initiated and conducted the "Time-out" before starting the procedure, as per protocol. The patient was asked to participate by confirming the accuracy of the "Time Out" information. Verification of the correct person, site, and procedure were performed and confirmed by me, the nursing staff, and the patient. "Time-out" conducted as per Joint Commission's Universal Protocol (UP.01.01.01). Time: 0927  Description of Procedure:       Position: Sitting Target Area: Knee Joint Approach: Just above the Lateral tibial plateau, lateral to the infrapatellar tendon. Area Prepped: Entire knee area, from the mid-thigh to the mid-shin. Prepping solution: ChloraPrep (2% chlorhexidine gluconate and 70% isopropyl alcohol) Safety Precautions: Aspiration looking for blood return was conducted prior to all injections. At no point did we inject any substances, as a needle was being advanced. No attempts were made at seeking any paresthesias. Safe injection practices and needle disposal techniques used. Medications properly checked for expiration dates. SDV (single dose vial) medications used. Description of the Procedure: Protocol guidelines were followed. The patient was placed in position over the fluoroscopy table. The target area was identified and the area prepped in the usual manner. Skin desensitized using vapocoolant spray. Skin & deeper tissues infiltrated with local anesthetic. Appropriate amount of time allowed to pass for local anesthetics to take effect. The procedure needles were then advanced to the target area. Proper needle placement secured. Negative aspiration confirmed. Solution injected in intermittent fashion, asking for systemic symptoms every 0.5cc of injectate. The needles were then removed and the area cleansed, making sure to leave some of the prepping solution back to take  advantage of its long term bactericidal properties. Vitals:   12/26/17 0858 12/26/17 0929  BP: 137/86 125/87  Pulse: (!) 56 (!) 55  Resp: 18 18  Temp: 98.2 F (36.8 C)   SpO2: 100% 100%  Weight: 220 lb (99.8 kg)   Height: 6\' 3"  (1.905 m)     Start Time: 0927 hrs. End Time: 0928 hrs. Materials:  Needle(s) Type: Regular needle Gauge: 22G Length:  3.5-in Medication(s): Please see orders for medications and dosing details.  Imaging Guidance:  Type of Imaging Technique: None used Indication(s): N/A Exposure Time: No patient exposure Contrast: None used. Fluoroscopic Guidance: N/A Ultrasound Guidance: N/A Interpretation: N/A  Antibiotic Prophylaxis:   Anti-infectives (From admission, onward)   None     Indication(s): None identified  Post-operative Assessment:  Post-procedure Vital Signs:  Pulse/HCG Rate: (!) 55  Temp: 98.2 F (36.8 C) Resp: 18 BP: 125/87 SpO2: 100 %  EBL: None  Complications: No immediate post-treatment complications observed by team, or reported by patient.  Note: The patient tolerated the entire procedure well. A repeat set of vitals were taken after the procedure and the patient was kept under observation following institutional policy, for this type of procedure. Post-procedural neurological assessment was performed, showing return to baseline, prior to discharge. The patient was provided with post-procedure discharge instructions, including a section on how to identify potential problems. Should any problems arise concerning this procedure, the patient was given instructions to immediately contact us, at any time, without hesitation. In any case, we plan to contact the patient by telephone for a follow-up status report regarding this interventional procedure.  Comments:  No additional relevant information.  Plan of Care   Imaging Orders  No imaging studies ordered today    Procedure Orders     KNEE INJECTION     KNEE  INJECTION  Medications ordered for procedure: Meds ordered this encounter  Medications  . lidocaine (PF) (XYLOCAINE) 1 % injection 4 mL  . ropivacaine (PF) 2 mg/mL (0.2%) (NAROPIN) injection 4 mL  . Sodium Hyaluronate SOSY 2 mL   Medications administered: We administered lidocaine (PF), ropivacaine (PF) 2 mg/mL (0.2%), and Sodium Hyaluronate.  See the medical record for exact dosing, route, and time of administration.  New Prescriptions   No medications on file   Disposition: Discharge home  Discharge Date & Time: 12/26/2017; 0935 hrs.   Physician-requested Follow-up: Return for PPE (2 wks) + Procedure (no sedation): (L) Hyalgan #3.  Future Appointments  Date Time Provider [place]  12/28/2017 11:00 AM Vevelyn Francois, NP [facility] None  01/09/2018 10:15 AM Milinda Pointer, MD [facility] None   Primary Care Physician: Jodi Marble, MD Location: [facility] Note by: Gaspar Cola, MD Date: 12/26/2017; Time: 10:54 AM  Disclaimer:  Medicine is not an exact science. The only guarantee in medicine is that nothing is guaranteed. It is important to note that the decision to proceed with this intervention was based on the information collected from the patient. The Data and conclusions were drawn from the patient's questionnaire, the interview, and the physical examination. Because the information was provided in large part by the patient, it cannot be guaranteed that it has not been purposely or unconsciously manipulated. Every effort has been made to obtain as much relevant data as possible for this evaluation. It is important to note that the conclusions that lead to this procedure are derived in large part from the available data. Always take into account that the treatment will also be dependent on availability of resources and existing treatment guidelines, considered by other Pain Management Practitioners as being common knowledge and  practice, at the time of the intervention. For Medico-Legal purposes, it is also important to point out that variation in procedural techniques and pharmacological choices are the acceptable norm. The indications, contraindications, technique, and results of the above procedure should only be interpreted and judged by a Board-Certified Interventional  Pain Specialist with extensive familiarity and expertise in the same exact procedure and technique.

## 2017-12-27 ENCOUNTER — Telehealth: Payer: Self-pay | Admitting: *Deleted

## 2017-12-27 NOTE — Telephone Encounter (Signed)
No problems post procedure. 

## 2017-12-28 ENCOUNTER — Ambulatory Visit: Payer: Medicaid Other | Attending: Nurse Practitioner | Admitting: Nurse Practitioner

## 2017-12-28 ENCOUNTER — Encounter: Payer: Self-pay | Admitting: Nurse Practitioner

## 2017-12-28 ENCOUNTER — Other Ambulatory Visit: Payer: Self-pay

## 2017-12-28 VITALS — HR 55 | Temp 97.9°F | Resp 18 | Ht 75.0 in | Wt 215.0 lb

## 2017-12-28 DIAGNOSIS — M25551 Pain in right hip: Secondary | ICD-10-CM | POA: Insufficient documentation

## 2017-12-28 DIAGNOSIS — N5089 Other specified disorders of the male genital organs: Secondary | ICD-10-CM | POA: Diagnosis not present

## 2017-12-28 DIAGNOSIS — Z720 Tobacco use: Secondary | ICD-10-CM | POA: Diagnosis not present

## 2017-12-28 DIAGNOSIS — Z9119 Patient's noncompliance with other medical treatment and regimen: Secondary | ICD-10-CM | POA: Insufficient documentation

## 2017-12-28 DIAGNOSIS — M5030 Other cervical disc degeneration, unspecified cervical region: Secondary | ICD-10-CM | POA: Insufficient documentation

## 2017-12-28 DIAGNOSIS — M25552 Pain in left hip: Secondary | ICD-10-CM | POA: Diagnosis not present

## 2017-12-28 DIAGNOSIS — M150 Primary generalized (osteo)arthritis: Secondary | ICD-10-CM

## 2017-12-28 DIAGNOSIS — N5319 Other ejaculatory dysfunction: Secondary | ICD-10-CM | POA: Diagnosis not present

## 2017-12-28 DIAGNOSIS — M47812 Spondylosis without myelopathy or radiculopathy, cervical region: Secondary | ICD-10-CM | POA: Diagnosis not present

## 2017-12-28 DIAGNOSIS — F1020 Alcohol dependence, uncomplicated: Secondary | ICD-10-CM | POA: Diagnosis not present

## 2017-12-28 DIAGNOSIS — M25561 Pain in right knee: Secondary | ICD-10-CM | POA: Insufficient documentation

## 2017-12-28 DIAGNOSIS — Z79891 Long term (current) use of opiate analgesic: Secondary | ICD-10-CM | POA: Insufficient documentation

## 2017-12-28 DIAGNOSIS — M159 Polyosteoarthritis, unspecified: Secondary | ICD-10-CM

## 2017-12-28 DIAGNOSIS — M1712 Unilateral primary osteoarthritis, left knee: Secondary | ICD-10-CM | POA: Insufficient documentation

## 2017-12-28 DIAGNOSIS — F1411 Cocaine abuse, in remission: Secondary | ICD-10-CM | POA: Insufficient documentation

## 2017-12-28 DIAGNOSIS — R079 Chest pain, unspecified: Secondary | ICD-10-CM | POA: Diagnosis not present

## 2017-12-28 DIAGNOSIS — J449 Chronic obstructive pulmonary disease, unspecified: Secondary | ICD-10-CM | POA: Insufficient documentation

## 2017-12-28 DIAGNOSIS — I25119 Atherosclerotic heart disease of native coronary artery with unspecified angina pectoris: Secondary | ICD-10-CM | POA: Diagnosis not present

## 2017-12-28 DIAGNOSIS — M7918 Myalgia, other site: Secondary | ICD-10-CM | POA: Insufficient documentation

## 2017-12-28 DIAGNOSIS — B182 Chronic viral hepatitis C: Secondary | ICD-10-CM | POA: Diagnosis not present

## 2017-12-28 DIAGNOSIS — K219 Gastro-esophageal reflux disease without esophagitis: Secondary | ICD-10-CM | POA: Diagnosis not present

## 2017-12-28 DIAGNOSIS — M25562 Pain in left knee: Secondary | ICD-10-CM | POA: Insufficient documentation

## 2017-12-28 DIAGNOSIS — T50992S Poisoning by other drugs, medicaments and biological substances, intentional self-harm, sequela: Secondary | ICD-10-CM | POA: Insufficient documentation

## 2017-12-28 DIAGNOSIS — G894 Chronic pain syndrome: Secondary | ICD-10-CM | POA: Insufficient documentation

## 2017-12-28 DIAGNOSIS — F1220 Cannabis dependence, uncomplicated: Secondary | ICD-10-CM | POA: Insufficient documentation

## 2017-12-28 DIAGNOSIS — M1990 Unspecified osteoarthritis, unspecified site: Secondary | ICD-10-CM | POA: Diagnosis not present

## 2017-12-28 DIAGNOSIS — F209 Schizophrenia, unspecified: Secondary | ICD-10-CM | POA: Diagnosis not present

## 2017-12-28 DIAGNOSIS — G8929 Other chronic pain: Secondary | ICD-10-CM

## 2017-12-28 DIAGNOSIS — F3130 Bipolar disorder, current episode depressed, mild or moderate severity, unspecified: Secondary | ICD-10-CM | POA: Diagnosis not present

## 2017-12-28 DIAGNOSIS — M5136 Other intervertebral disc degeneration, lumbar region: Secondary | ICD-10-CM | POA: Insufficient documentation

## 2017-12-28 DIAGNOSIS — M8949 Other hypertrophic osteoarthropathy, multiple sites: Secondary | ICD-10-CM

## 2017-12-28 MED ORDER — CYCLOBENZAPRINE HCL 10 MG PO TABS
10.0000 mg | ORAL_TABLET | Freq: Three times a day (TID) | ORAL | 0 refills | Status: DC | PRN
Start: 2017-12-31 — End: 2018-01-23

## 2017-12-28 MED ORDER — HYDROCODONE-ACETAMINOPHEN 5-325 MG PO TABS: 1.0000 | ORAL_TABLET | Freq: Four times a day (QID) | ORAL | 0 refills | Status: DC | PRN

## 2017-12-28 NOTE — Progress Notes (Signed)
Nursing Pain Medication Assessment:  Safety precautions to be maintained throughout the outpatient stay will include: orient to surroundings, keep bed in low position, maintain call bell within reach at all times, provide assistance with transfer out of bed and ambulation.  Medication Inspection Compliance: Pill count conducted under aseptic conditions, in front of the patient. Neither the pills nor the bottle was removed from the patient's sight at any time. Once count was completed pills were immediately returned to the patient in their original bottle.  Medication: Hydrocodone/APAP Pill/Patch Count: 11 of 120 pills remain Pill/Patch Appearance: Markings consistent with prescribed medication Bottle Appearance: Standard pharmacy container. Clearly labeled. Filled Date: 05/09 / 2019 Last Medication intake:  Today

## 2017-12-28 NOTE — Patient Instructions (Addendum)
____________You have been given a script for Hydrocodone today.  ________________________________________________________________________________  Medication Rules  Applies to: All patients receiving prescriptions (written or electronic).  Pharmacy of record: Pharmacy where electronic prescriptions will be sent. If written prescriptions are taken to a different pharmacy, please inform the nursing staff. The pharmacy listed in the electronic medical record should be the one where you would like electronic prescriptions to be sent.  Prescription refills: Only during scheduled appointments. Applies to both, written and electronic prescriptions.  NOTE: The following applies primarily to controlled substances (Opioid* Pain Medications).   Patient's responsibilities: 1. Pain Pills: Bring all pain pills to every appointment (except for procedure appointments). 2. Pill Bottles: Bring pills in original pharmacy bottle. Always bring newest bottle. Bring bottle, even if empty. 3. Medication refills: You are responsible for knowing and keeping track of what medications you need refilled. The day before your appointment, write a list of all prescriptions that need to be refilled. Bring that list to your appointment and give it to the admitting nurse. Prescriptions will be written only during appointments. If you forget a medication, it will not be "Called in", "Faxed", or "electronically sent". You will need to get another appointment to get these prescribed. 4. Prescription Accuracy: You are responsible for carefully inspecting your prescriptions before leaving our office. Have the discharge nurse carefully go over each prescription with you, before taking them home. Make sure that your name is accurately spelled, that your address is correct. Check the name and dose of your medication to make sure it is accurate. Check the number of pills, and the written instructions to make sure they are clear and accurate.  Make sure that you are given enough medication to last until your next medication refill appointment. 5. Taking Medication: Take medication as prescribed. Never take more pills than instructed. Never take medication more frequently than prescribed. Taking less pills or less frequently is permitted and encouraged, when it comes to controlled substances (written prescriptions).  6. Inform other Doctors: Always inform, all of your healthcare providers, of all the medications you take. 7. Pain Medication from other Providers: You are not allowed to accept any additional pain medication from any other Doctor or Healthcare provider. There are two exceptions to this rule. (see below) In the event that you require additional pain medication, you are responsible for notifying us, as stated below. 8. Medication Agreement: You are responsible for carefully reading and following our Medication Agreement. This must be signed before receiving any prescriptions from our practice. Safely store a copy of your signed Agreement. Violations to the Agreement will result in no further prescriptions. (Additional copies of our Medication Agreement are available upon request.) 9. Laws, Rules, & Regulations: All patients are expected to follow all Federal and [employer], [employer], Rules, [employer]. Ignorance of the Laws does not constitute a valid excuse. The use of any illegal substances is prohibited. 10. Adopted CDC guidelines & recommendations: Target dosing levels will be at or below 60 MME/day. Use of benzodiazepines** is not recommended.  Exceptions: There are only two exceptions to the rule of not receiving pain medications from other Healthcare Providers. 1. Exception #1 (Emergencies): In the event of an emergency (i.e.: accident requiring emergency care), you are allowed to receive additional pain medication. However, you are responsible for: As soon as you are able, call our office [phone], at any time of the  day or night, and leave a message stating your name, the date and nature of  the emergency, and the name and dose of the medication prescribed. In the event that your call is answered by a member of our staff, make sure to document and save the date, time, and the name of the person that took your information.  2. Exception #2 (Planned Surgery): In the event that you are scheduled by another doctor or dentist to have any type of surgery or procedure, you are allowed (for a period no longer than 30 days), to receive additional pain medication, for the acute post-op pain. However, in this case, you are responsible for picking up a copy of our "Post-op Pain Management for Surgeons" handout, and giving it to your surgeon or dentist. This document is available at our office, and does not require an appointment to obtain it. Simply go to our office during business hours (Monday-Thursday from 8:00 AM to 4:00 PM) (Friday 8:00 AM to 12:00 Noon) or if you have a scheduled appointment with [place], prior to your surgery, and ask for it by name. In addition, you will need to provide [place] with your name, name of your surgeon, type of surgery, and date of procedure or surgery.  *Opioid medications include: morphine, codeine, oxycodone, oxymorphone, hydrocodone, hydromorphone, meperidine, tramadol, tapentadol, buprenorphine, fentanyl, methadone. **Benzodiazepine medications include: diazepam (Valium), alprazolam (Xanax), clonazepam (Klonopine), lorazepam (Ativan), clorazepate (Tranxene), chlordiazepoxide (Librium), estazolam (Prosom), oxazepam (Serax), temazepam (Restoril), triazolam (Halcion) (Last updated: 09/21/2017) ____________________________________________________________________________________________   ____________________________________________________________________________________________  Drug Holidays (Slow)  What is a "Drug Holiday"? Drug Holiday: is the name given to the period of time during which a  patient stops taking a medication(s) for the purpose of eliminating tolerance to the drug.  Benefits . Improved effectiveness of opioids. . Decreased opioid dose needed to achieve benefits. . Improved pain with lesser dose.  What is tolerance? Tolerance: is the progressive decreased in effectiveness of a drug due to its repetitive use. With repetitive use, the body gets use to the medication and as a consequence, it loses its effectiveness. This is a common problem seen with opioid pain medications. As a result, a larger dose of the drug is needed to achieve the same effect that used to be obtained with a smaller dose.  How long should a "Drug Holiday" last? At least 14 consecutive days. (2 weeks)  What are withdrawals? Withdrawals: refers to the wide range of symptoms that occur after stopping or dramatically reducing opiate drugs after heavy and prolonged use. Withdrawal symptoms do not occur to patients that use low dose opioids, or those who take the medication sporadically. Contrary to benzodiazepine (example: Valium, Xanax, etc.) or alcohol withdrawals ("Delirium Tremens"), opioid withdrawals are not lethal. Withdrawals are the physical manifestation of the body getting rid of the excess receptors.  Expected Symptoms Early symptoms of withdrawal may include: . Agitation . Anxiety . Muscle aches . Increased tearing . Insomnia . Runny nose . Sweating . Yawning  Late symptoms of withdrawal may include: . Abdominal cramping . Diarrhea . Dilated pupils . Goose bumps . Nausea . Vomiting  Will I experience withdrawals? Due to the slow nature of the taper, it is very unlikely that you will experience any.  What is a slow taper? Taper: refers to the gradual decrease in dose. ___________________________________________________________________________________________

## 2017-12-28 NOTE — Progress Notes (Signed)
Patient's Name: Keith Gibbs  MRN: [id number]  Referring Provider: Jodi Marble, MD  DOB: 1960-10-02  PCP: Jodi Marble, MD  DOS: 12/28/2017  Note by: Vevelyn Francois NP  Service setting: Ambulatory outpatient  Specialty: Interventional Pain Management  Location: [facility] (AMB) Pain Management Facility    Patient type: Established    Primary Reason(s) for Visit: Encounter for prescription drug management. (Level of risk: moderate)  CC: Knee Pain (left); Hand Pain (right); and Back Pain (lower)  HPI  Keith Gibbs is a 57 y.o. year old, male patient, who comes today for a medication management evaluation. He has Schizophrenia ([place]); COPD (chronic obstructive pulmonary disease) ([place]); Chronic hepatitis C ([place]); GERD (gastroesophageal reflux disease); Swollen testicle; Abnormal ejaculation; Chest pain, unspecified; Bipolar 1 disorder ([place]); Cocaine abuse in remission ([place]); Chest pain; Angina at rest [facility]); Severe recurrent major depression without psychotic features ([place]); Coronary artery disease; Alcohol use disorder, moderate, dependence ([place]); Cocaine use disorder, moderate, dependence ([place]); Cannabis use disorder, moderate, dependence ([place]); Overdose of medication, intentional self-harm, sequela ([place]); Tobacco use disorder; Chronic knee pain (Primary Area of Pain)  (Bilateral) (R>L); Chronic low back pain (Secondary Area of Pain) (Bilateral); Chronic hip pain (Tertiary Area of Pain) (Bilateral) (R>L); Chronic shoulder pain (Fourth Area of Pain) (Right); Spondylosis without myelopathy or radiculopathy, cervical region; Chronic pain syndrome; Long term current use of opiate analgesic; Opiate use; Pharmacologic therapy; Disorder of skeletal system; Problems influencing health status; 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); Chronic neck pain (Fifth Area of Pain) ([place]); Chronic musculoskeletal pain; 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 His primarily concern today is the Knee Pain (left); Hand Pain (right); and Back Pain (lower)  Pain Assessment: Location: Left Knee Radiating: denies Onset: More than a month ago Duration: Chronic pain Quality: Stabbing, Aching Severity: 6 /10 (subjective, self-reported pain score)  Note: Reported level is compatible with observation. Clinically the patient looks like a 2/10 A 2/10 is viewed as "Mild to Moderate" and described as noticeable and distracting. Impossible to hide from other people. More frequent flare-ups. Still possible to adapt and function close to normal. It can be very annoying and may have occasional stronger flare-ups. With discipline, patients may get used to it and adapt. Information on the proper use of the pain scale provided to the patient today. When using our objective Pain Scale, levels between 6 and 10/10 are said to belong in an emergency room, as it progressively worsens from a 6/10, described as severely limiting, requiring emergency care not usually available at an outpatient pain management facility. At a 6/10 level, communication becomes difficult and requires great effort. Assistance to reach the emergency department may be required. Facial flushing and profuse sweating along with potentially dangerous increases in heart rate and blood pressure will be evident. Timing: Constant Modifying factors: medications BP:    HR: (!) 55  Keith Gibbs was last scheduled for an appointment on 11/30/2017 for medication  management. During today's appointment we reviewed Mr. Rickles's chronic pain status, as well as his outpatient medication regimen. He feels like his knee pain is getting better.  He asked about  The patient  reports that he does not use drugs. His body mass index is 26.87 kg/m.  Further details on both, my assessment(s), as well as the proposed treatment plan, please see below.  Controlled Substance Pharmacotherapy Assessment REMS (Risk Evaluation and Mitigation Strategy)  Analgesic:Hydrocodone/acetaminophen 5/325 mg 1 tablet in the 6hours (20 mg/dayof Hydrocodone) MME/day:2m/day    Keith Martins RN  12/28/2017 11:05 AM  Sign at close encounter Nursing Pain Medication Assessment:  Safety precautions to be maintained throughout the outpatient stay will include: orient to surroundings, keep bed in low position, maintain call bell within reach at all times, provide assistance with transfer out of bed and ambulation.  Medication Inspection Compliance: Pill count conducted under aseptic conditions, in front of the patient. Neither the pills nor the bottle was removed from the patient's sight at any time. Once count was completed pills were immediately returned to the patient in their original bottle.  Medication: Hydrocodone/APAP Pill/Patch Count: 11 of 120 pills remain Pill/Patch Appearance: Markings consistent with prescribed medication Bottle Appearance: Standard pharmacy container. Clearly labeled. Filled Date: 05/09 / 2019 Last Medication intake:  Today   Pharmacokinetics: Liberation and absorption (onset of action): WNL Distribution (time to peak effect): WNL Metabolism and excretion (duration of action): WNL         Pharmacodynamics: Desired effects: Analgesia: Mr. APrimereports >50% benefit. Functional ability: Patient reports that medication allows him to accomplish basic ADLs Clinically meaningful improvement in function (CMIF): Sustained CMIF goals met Perceived  effectiveness: Described as relatively effective, allowing for increase in activities of daily living (ADL) Undesirable effects: Side-effects or Adverse reactions: None reported Monitoring: [place] PMP: Online review of the past 189-montheriod conducted. Compliant with practice rules and regulations Last UDS on record: Summary  Date Value Ref Range Status  09/11/2017 FINAL  Final    Comment:    ==================================================================== TOXASSURE COMP DRUG ANALYSIS,UR ==================================================================== Test                             Result       Flag       Units Drug Present and Declared for Prescription Verification   Hydrocodone                    1255         EXPECTED   ng/mg creat   Dihydrocodeine                 168          EXPECTED   ng/mg creat   Norhydrocodone                 820          EXPECTED   ng/mg creat    Sources of hydrocodone include scheduled prescription    medications. Dihydrocodeine and norhydrocodone are expected    metabolites of hydrocodone. Dihydrocodeine is also available as a    scheduled prescription medication.   Desmethylcyclobenzaprine       PRESENT      EXPECTED    Desmethylcyclobenzaprine is an expected metabolite of    cyclobenzaprine.   Bupropion                      PRESENT      EXPECTED   Hydroxybupropion               PRESENT  EXPECTED    Hydroxybupropion is an expected metabolite of bupropion.   Acetaminophen                  PRESENT      EXPECTED Drug Present not Declared for Prescription Verification   Guaifenesin                    PRESENT      UNEXPECTED    Guaifenesin may be administered as an over-the-counter or    prescription drug; it may also be present as a breakdown product    of methocarbamol. Drug Absent but Declared for Prescription Verification   Salicylate                     Not Detected UNEXPECTED    Aspirin, as indicated in the declared medication list, is  not    always detected even when used as directed. ==================================================================== Test                      Result    Flag   Units      Ref Range   Creatinine              74               mg/dL      >=20 ==================================================================== Declared Medications:  The flagging and interpretation on this report are based on the  following declared medications.  Unexpected results may arise from  inaccuracies in the declared medications.  **Note: The testing scope of this panel includes these medications:  Bupropion  Cyclobenzaprine  Hydrocodone (Hydrocodone-Acetaminophen)  **Note: The testing scope of this panel does not include small to  moderate amounts of these reported medications:  Acetaminophen  Acetaminophen (Hydrocodone-Acetaminophen)  Aspirin (Aspirin 81)  **Note: The testing scope of this panel does not include following  reported medications:  Albuterol  Atorvastatin  Cetirizine  Isosorbide Mononitrate  Melatonin  Mometasone  Multivitamin  Nitroglycerin  Omeprazole  Prazosin (Minipress)  Ranolazine (Ranexa)  Ropinirole (Requip)  Tamsulosin (Flomax)  Ticagrelor (Brilinta) ==================================================================== For clinical consultation, please call [phone]. ====================================================================    UDS interpretation: Compliant          Medication Assessment Form: Reviewed. Patient indicates being compliant with therapy Treatment compliance: Compliant Risk Assessment Profile: Aberrant behavior: See prior evaluations. None observed or detected today Comorbid factors increasing risk of overdose: See prior notes. No additional risks detected today Risk of substance use disorder (SUD): Very High Opioid Risk Tool - 12/26/17 0906      Family History of Substance Abuse   Alcohol  Positive Male    Illegal Drugs  Negative    Rx  Drugs  Negative      Personal History of Substance Abuse   Alcohol  Positive Male or Male    Illegal Drugs  Positive Male or Male    Rx Drugs  Negative      Age   Age between 2-45 years   No      History of Preadolescent Sexual Abuse   History of Preadolescent Sexual Abuse  Negative or Male      Psychological Disease   Psychological Disease  Positive    ADD  Negative    OCD  Negative    Bipolar  Positive    Schizophrenia  Positive    Depression  Positive      Total Score   Opioid Risk Tool Scoring  13    Opioid Risk Interpretation  High Risk      ORT Scoring interpretation table:  Score <3 = Low Risk for SUD  Score between 4-7 = Moderate Risk for SUD  Score >8 = High Risk for Opioid Abuse   Risk Mitigation Strategies:  Patient Counseling: Covered Patient-Prescriber Agreement (PPA): Present and active  Notification to other healthcare providers: Done  Pharmacologic Plan: No change in therapy, at this time.             Laboratory Chemistry  Inflammation Markers (CRP: Acute Phase) (ESR: Chronic Phase) Lab Results  Component Value Date   CRP 24.6 (H) 09/11/2017   ESRSEDRATE 16 09/11/2017   LATICACIDVEN 1.2 02/20/2017                         Rheumatology Markers Lab Results  Component Value Date   ANA Negative 11/01/2016                        Renal Function Markers Lab Results  Component Value Date   BUN 11 09/11/2017   CREATININE 0.98 09/11/2017   BCR 11 09/11/2017   GFRAA 99 09/11/2017   GFRNONAA 85 09/11/2017                              Hepatic Function Markers Lab Results  Component Value Date   AST 15 09/11/2017   ALT 31 02/20/2017   ALBUMIN 4.2 09/11/2017   ALKPHOS 71 09/11/2017   HCVAB >11.0 (H) 11/01/2016   LIPASE 45 02/20/2017                        Electrolytes Lab Results  Component Value Date   NA 139 09/11/2017   K 4.7 09/11/2017   CL 99 09/11/2017   CALCIUM 9.0 09/11/2017   MG 2.1 09/11/2017                         Neuropathy Markers Lab Results  Component Value Date   VITAMINB12 457 09/11/2017   HGBA1C 5.2 12/28/2016                        Bone Pathology Markers Lab Results  Component Value Date   25OHVITD1 35 09/11/2017   25OHVITD2 <1.0 09/11/2017   25OHVITD3 35 09/11/2017                         Coagulation Parameters Lab Results  Component Value Date   INR 1.02 11/01/2016   LABPROT 13.4 11/01/2016   PLT 249 08/30/2017                        Cardiovascular Markers Lab Results  Component Value Date   BNP 12.0 12/26/2016   TROPONINI <0.03 02/20/2017   HGB 15.4 08/30/2017   HCT 44.9 08/30/2017                         CA Markers No results found for: CEA, CA125, LABCA2                      Note: Lab results reviewed.  Recent Diagnostic Imaging Results  DG Knee Complete 4 Views Right CLINICAL DATA:  Fall  EXAM: RIGHT KNEE - COMPLETE 4+ VIEW  COMPARISON:  03/04/2017  FINDINGS: No acute displaced fracture or malalignment. Mild patellofemoral degenerative change. Mild to moderate medial compartment degenerative change and mild lateral compartment degenerative change. Small to moderate knee effusion.  IMPRESSION: 1. No acute osseous abnormality. 2. Knee effusion  Electronically Signed   By: Donavan Foil M.D.   On: 10/27/2017 03:29 DG Lumbar Spine Complete CLINICAL DATA:  Fall  EXAM: LUMBAR SPINE - COMPLETE 4+ VIEW  COMPARISON:  08/14/2017, 01/19/2017  FINDINGS: Stable lumbar alignment. Status post posterior rod and fixating screws L4 through S1 with interbody devices at L4-L5 and L5-S1. Chronic superior and anterior endplate deformity at L3 with limbus vertebra. Mild degenerative changes at L1-L2. Vertebral body heights are maintained.  IMPRESSION: 1. No acute osseous abnormality 2. Postsurgical changes L4 through S1.  Electronically Signed   By: Donavan Foil M.D.   On: 10/27/2017 03:25 DG Cervical Spine Complete CLINICAL DATA:   Fall  EXAM: CERVICAL SPINE - COMPLETE 4+ VIEW  COMPARISON:  09/19/2017  FINDINGS: Dens and lateral masses are within normal limits. Cervical alignment within normal limits. Moderate degenerative changes at C5-C6 and C6-C7. Bilateral foraminal narrowing at these levels. Prevertebral soft tissue thickness is normal  IMPRESSION: No radiographic evidence for acute osseous abnormality. Moderate degenerative changes at C5-C6 and C6-C7  Electronically Signed   By: Donavan Foil M.D.   On: 10/27/2017 03:22  Complexity Note: Imaging results reviewed. Results shared with Mr. Fennell, using Layman's terms.                         Meds   Current Outpatient Medications:  .  acetaminophen (TYLENOL) 500 MG tablet, Take 2 tablets (1,000 mg total) by mouth every 6 (six) hours as needed. For pain., Disp: 90 tablet, Rfl: 1 .  albuterol (PROAIR HFA) 108 (90 Base) MCG/ACT inhaler, Inhale 2 puffs into the lungs every 6 (six) hours as needed for wheezing or shortness of breath., Disp: 1 Inhaler, Rfl: 1 .  aspirin EC 81 MG tablet, Take 1 tablet (81 mg total) by mouth daily. (Patient taking differently: Take 325 mg by mouth daily. ), Disp: 30 tablet, Rfl: 1 .  atorvastatin (LIPITOR) 40 MG tablet, Take 1 tablet (40 mg total) by mouth daily at 6 PM., Disp: 30 tablet, Rfl: 1 .  buPROPion (WELLBUTRIN SR) 100 MG 12 hr tablet, Take 100 mg by mouth 2 (two) times daily., Disp: , Rfl:  .  celecoxib (CELEBREX) 200 MG capsule, Take 200 mg by mouth 2 (two) times daily., Disp: , Rfl:  .  cetirizine (ZYRTEC) 10 MG tablet, Take 10 mg by mouth daily., Disp: , Rfl:  .  [START ON 12/31/2017] cyclobenzaprine (FLEXERIL) 10 MG tablet, Take 1 tablet (10 mg total) by mouth 3 (three) times daily as needed for muscle spasms., Disp: 90 tablet, Rfl: 0 .  divalproex (DEPAKOTE) 500 MG DR tablet, Take 500 mg by mouth 2 (two) times daily., Disp: , Rfl: 0 .  DULERA 200-5 MCG/ACT AERO, TAKE 2 PUFFS BY MOUTH TWICE A DAY, Disp: 1 Inhaler,  Rfl: 1 .  [START ON 12/31/2017] HYDROcodone-acetaminophen (NORCO) 5-325 MG tablet, Take 1 tablet by mouth every 6 (six) hours as needed for severe pain. Max.: 4/day. Must Last 30 days., Disp: 120 tablet, Rfl: 0 .  isosorbide mononitrate (IMDUR) 30 MG 24 hr tablet, Take 1 tablet (30 mg total) by mouth daily. (Patient taking differently: Take 60 mg  by mouth daily. ), Disp: 30 tablet, Rfl: 1 .  Melatonin 3 MG TABS, Take 3 mg by mouth at bedtime., Disp: , Rfl:  .  meloxicam (MOBIC) 7.5 MG tablet, Take 7.5 mg by mouth daily., Disp: , Rfl:  .  nitroGLYCERIN (NITROSTAT) 0.4 MG SL tablet, Place 1 tablet (0.4 mg total) under the tongue every 5 (five) minutes x 3 doses as needed for chest pain., Disp: 30 tablet, Rfl: 0 .  omeprazole (PRILOSEC) 40 MG capsule, Take 40 mg by mouth daily., Disp: , Rfl:  .  prazosin (MINIPRESS) 1 MG capsule, Take 1 capsule (1 mg total) by mouth at bedtime., Disp: 30 capsule, Rfl: 1 .  ranolazine (RANEXA) 500 MG 12 hr tablet, Take 500 mg by mouth 2 (two) times daily., Disp: , Rfl:  .  rOPINIRole (REQUIP) 0.5 MG tablet, TAKE 1 TO 2 TABLETS BY MOUTH TWICE DAILY AS NEEDED FOR RESTLESS LEGS., Disp: , Rfl: 1 .  tamsulosin (FLOMAX) 0.4 MG CAPS capsule, Take 1 capsule (0.4 mg total) by mouth daily., Disp: 30 capsule, Rfl: 1 .  ticagrelor (BRILINTA) 60 MG TABS tablet, Take 60 mg by mouth 2 (two) times daily. , Disp: , Rfl:   ROS  Constitutional: Denies any fever or chills Gastrointestinal: No reported hemesis, hematochezia, vomiting, or acute GI distress Musculoskeletal: Denies any acute onset joint swelling, redness, loss of ROM, or weakness Neurological: No reported episodes of acute onset apraxia, aphasia, dysarthria, agnosia, amnesia, paralysis, loss of coordination, or loss of consciousness  Allergies  Mr. Barstow is allergic to latuda [lurasidone hcl]; naproxen; and saphris [asenapine].  [place]  Drug: Mr. Plucinski  reports that he does not use drugs. Alcohol:  reports that he drinks  alcohol. Tobacco:  reports that he has been smoking cigarettes.  He has been smoking about 0.50 packs per day. He has never used smokeless tobacco. Medical:  has a past medical history of Anxiety, Arthritis, Bursitis, COPD (chronic obstructive pulmonary disease) ([place]), Depression, GERD (gastroesophageal reflux disease), Hepatitis C (2012), Hypertension, Lung disease, MI (myocardial infarction) ([place]), and Stroke ([place]). Surgical: Mr. Junkin  has a past surgical history that includes Appendectomy; Shoulder surgery (Right, 04/09/2012); Back surgery; Abdominal surgery; Colonoscopy; Cardiac catheterization (Left, 06/10/2016); Cardiac catheterization (N/A, 06/10/2016); Esophagogastroduodenoscopy (egd) with propofol (N/A, 01/05/2017); Colonoscopy with propofol (N/A, 01/05/2017); Coronary angioplasty with stent; and Knee arthroscopy with medial menisectomy (Right, 09/05/2017). Family: family history includes Early death in his father; Heart disease in his father and mother; Heart failure in his father; Hypertension in his father, mother, and sister; Osteoarthritis in his mother.  Constitutional Exam  General appearance: Well nourished, well developed, and well hydrated. In no apparent acute distress Vitals:   12/28/17 1058  Pulse: (!) 55  Resp: 18  Temp: 97.9 F (36.6 C)  TempSrc: Oral  SpO2: 100%  Weight: 215 lb (97.5 kg)  Height: 6' 3"  (1.905 m)  Psych/Mental status: Alert, oriented x 3 (person, place, & time)       Eyes: PERLA Respiratory: No evidence of acute respiratory distress   Upper Extremity (UE) Exam    Side: Right upper extremity  Side: Left upper extremity  Skin & Extremity Inspection: Skin color, temperature, and hair growth are WNL. No peripheral edema or cyanosis. No masses, redness, swelling, asymmetry, or associated skin lesions. No contractures.  Skin & Extremity Inspection: Bouchard's nodes (PIP)  Functional ROM: Unrestricted ROM          Functional ROM: Decreased ROM  Muscle Tone/Strength: Functionally intact. No obvious neuro-muscular anomalies detected.  Muscle Tone/Strength: Functionally intact. No obvious neuro-muscular anomalies detected.  Sensory (Neurological): Unimpaired          Sensory (Neurological): Unimpaired          Palpation: No palpable anomalies              Palpation: No palpable anomalies              Provocative Test(s):  Phalen's test: deferred Tinel's test: deferred Apley's scratch test (touch opposite shoulder):  Action 1 (Across chest): deferred Action 2 (Overhead): deferred Action 3 (LB reach): deferred   Provocative Test(s):  Phalen's test: deferred Tinel's test: deferred Apley's scratch test (touch opposite shoulder):  Action 1 (Across chest): deferred Action 2 (Overhead): deferred Action 3 (LB reach): deferred    Gait & Posture Assessment  Ambulation: Unassisted Gait: Relatively normal for age and body habitus Posture: WNL   Lower Extremity Exam    Side: Right lower extremity  Side: Left lower extremity  Stability: No instability observed          Stability: No instability observed          Skin & Extremity Inspection: Skin color, temperature, and hair growth are WNL. No peripheral edema or cyanosis. No masses, redness, swelling, asymmetry, or associated skin lesions. No contractures.  Skin & Extremity Inspection:  Slight bruising to injection site (Medial)  Functional ROM: Unrestricted ROM                  Functional ROM: Unrestricted ROM                  Muscle Tone/Strength: Functionally intact. No obvious neuro-muscular anomalies detected.  Muscle Tone/Strength: Functionally intact. No obvious neuro-muscular anomalies detected.  Sensory (Neurological): Unimpaired  Sensory (Neurological): Unimpaired  Palpation: No palpable anomalies  Palpation: No palpable anomalies   Assessment  Primary Diagnosis & Pertinent Problem List: The primary encounter diagnosis was Chronic knee pain (Primary Area of Pain)   (Bilateral) (R>L). Diagnoses of Spondylosis without myelopathy or radiculopathy, cervical region, Osteoarthritis, Chronic hip pain (Tertiary Area of Pain) (Bilateral) (R>L), Chronic musculoskeletal pain, Chronic pain syndrome, and Long term current use of opiate analgesic were also pertinent to this visit.  Status Diagnosis  Controlled Controlled Worsening 1. Chronic knee pain (Primary Area of Pain)  (Bilateral) (R>L)   2. Spondylosis without myelopathy or radiculopathy, cervical region   3. Osteoarthritis   4. Chronic hip pain (Tertiary Area of Pain) (Bilateral) (R>L)   5. Chronic musculoskeletal pain   6. Chronic pain syndrome   7. Long term current use of opiate analgesic     Problems updated and reviewed during this visit: No problems updated. Plan of Care  Pharmacotherapy (Medications Ordered): Meds ordered this encounter  Medications  . cyclobenzaprine (FLEXERIL) 10 MG tablet    Sig: Take 1 tablet (10 mg total) by mouth 3 (three) times daily as needed for muscle spasms.    Dispense:  90 tablet    Refill:  0    Do not place medication on "Automatic Refill". Fill one day early if pharmacy is closed on scheduled refill date.    Order Specific Question:   Supervising Provider    Answer:   Milinda Pointer [phone]  . HYDROcodone-acetaminophen (NORCO) 5-325 MG tablet    Sig: Take 1 tablet by mouth every 6 (six) hours as needed for severe pain. Max.: 4/day. Must Last 30 days.    Dispense:  120 tablet    Refill:  0    Fill one day early if pharmacy is closed on scheduled refill date. Do not fill until: 12/31/2017 To last until: 01/30/2018    Order Specific Question:   Supervising Provider    Answer:   Milinda Pointer [phone]   New Prescriptions   No medications on file   Medications administered today: Tamotsu W. Colon had no medications administered during this visit. Lab-work, procedure(s), and/or referral(s): Orders Placed This Encounter  Procedures  . ToxASSURE Select  13 (MW), Urine   Imaging and/or referral(s): None  Interventional management options: Planned, scheduled, and/or pending: Therapeuticleft intra-articular Hyalgan knee injection #3, no fluoroscopy or IV sedation. In formation provided for drug holiday and site info for [email or web address]   Considering: Diagnostic left intra-articularHyalganknee injectionseries Diagnostic bilateral genicular nerve block Possiblebilateral genicular nerve RFA Diagnostic lumbar facet nerve block Possible bilateral lumbar facet RFA Diagnostic bilateral hip injections Diagnosticright suprascapular nerve block Possible right suprascapular nerve RFA Diagnosticbilateral cervical facet nerve block Possible bilateral cervical RFA   Palliative PRN treatment(s): None at this time     Provider-requested follow-up: Return in about 4 weeks (around 01/22/2018) for MedMgmt with Me Dionisio David).  Future Appointments  Date Time Provider [place]  01/09/2018 10:15 AM Milinda Pointer, MD [facility] None  01/23/2018 11:00 AM Vevelyn Francois, NP [facility] None   Primary Care Physician: Jodi Marble, MD Location: [facility] Outpatient [facility] Management Facility Note by: Vevelyn Francois NP Date: 12/28/2017; Time: 12:21 PM  Pain Score Disclaimer: We use the NRS-11 scale. This is a self-reported, subjective measurement of pain severity with only modest accuracy. It is used primarily to identify changes within a particular patient. It must be understood that outpatient pain scales are significantly less accurate that those used for research, where they can be applied under ideal controlled circumstances with minimal exposure to variables. In reality, the score is likely to be a combination of pain intensity and pain affect, where pain affect describes the degree of emotional arousal or changes in action readiness caused by the sensory experience of pain. Factors such as social and work situation,  setting, emotional state, anxiety levels, expectation, and prior pain experience may influence pain perception and show large inter-individual differences that may also be affected by time variables.  Patient instructions provided during this appointment: Patient Instructions  ____________You have been given a script for Hydrocodone today.  ________________________________________________________________________________  Medication Rules  Applies to: All patients receiving prescriptions (written or electronic).  Pharmacy of record: Pharmacy where electronic prescriptions will be sent. If written prescriptions are taken to a different pharmacy, please inform the nursing staff. The pharmacy listed in the electronic medical record should be the one where you would like electronic prescriptions to be sent.  Prescription refills: Only during scheduled appointments. Applies to both, written and electronic prescriptions.  NOTE: The following applies primarily to controlled substances (Opioid* Pain Medications).   Patient's responsibilities: 1. Pain Pills: Bring all pain pills to every appointment (except for procedure appointments). 2. Pill Bottles: Bring pills in original pharmacy bottle. Always bring newest bottle. Bring bottle, even if empty. 3. Medication refills: You are responsible for knowing and keeping track of what medications you need refilled. The day before your appointment, write a list of all prescriptions that need to be refilled. Bring that list to your appointment and give it to the admitting nurse. Prescriptions will be written only during appointments. If you forget a medication, it will not be "  Called in", "Faxed", or "electronically sent". You will need to get another appointment to get these prescribed. 4. Prescription Accuracy: You are responsible for carefully inspecting your prescriptions before leaving our office. Have the discharge nurse carefully go over each prescription  with you, before taking them home. Make sure that your name is accurately spelled, that your address is correct. Check the name and dose of your medication to make sure it is accurate. Check the number of pills, and the written instructions to make sure they are clear and accurate. Make sure that you are given enough medication to last until your next medication refill appointment. 5. Taking Medication: Take medication as prescribed. Never take more pills than instructed. Never take medication more frequently than prescribed. Taking less pills or less frequently is permitted and encouraged, when it comes to controlled substances (written prescriptions).  6. Inform other Doctors: Always inform, all of your healthcare providers, of all the medications you take. 7. Pain Medication from other Providers: You are not allowed to accept any additional pain medication from any other Doctor or Healthcare provider. There are two exceptions to this rule. (see below) In the event that you require additional pain medication, you are responsible for notifying us, as stated below. 8. Medication Agreement: You are responsible for carefully reading and following our Medication Agreement. This must be signed before receiving any prescriptions from our practice. Safely store a copy of your signed Agreement. Violations to the Agreement will result in no further prescriptions. (Additional copies of our Medication Agreement are available upon request.) 9. Laws, Rules, & Regulations: All patients are expected to follow all Federal and [employer], Rules, [employer]. Ignorance of the Laws does not constitute a valid excuse. The use of any illegal substances is prohibited. 10. Adopted CDC guidelines & recommendations: Target dosing levels will be at or below 60 MME/day. Use of benzodiazepines** is not recommended.  Exceptions: There are only two exceptions to the rule of not receiving pain medications from other  Healthcare Providers. 1. Exception #1 (Emergencies): In the event of an emergency (i.e.: accident requiring emergency care), you are allowed to receive additional pain medication. However, you are responsible for: As soon as you are able, call our office [phone], at any time of the day or night, and leave a message stating your name, the date and nature of the emergency, and the name and dose of the medication prescribed. In the event that your call is answered by a member of our staff, make sure to document and save the date, time, and the name of the person that took your information.  2. Exception #2 (Planned Surgery): In the event that you are scheduled by another doctor or dentist to have any type of surgery or procedure, you are allowed (for a period no longer than 30 days), to receive additional pain medication, for the acute post-op pain. However, in this case, you are responsible for picking up a copy of our "Post-op Pain Management for Surgeons" handout, and giving it to your surgeon or dentist. This document is available at our office, and does not require an appointment to obtain it. Simply go to our office during business hours (Monday-Thursday from 8:00 AM to 4:00 PM) (Friday 8:00 AM to 12:00 Noon) or if you have a scheduled appointment with [place], prior to your surgery, and ask for it by name. In addition, you will need to provide [place] with your name, name of your surgeon, type of surgery, and  date of procedure or surgery.  *Opioid medications include: morphine, codeine, oxycodone, oxymorphone, hydrocodone, hydromorphone, meperidine, tramadol, tapentadol, buprenorphine, fentanyl, methadone. **Benzodiazepine medications include: diazepam (Valium), alprazolam (Xanax), clonazepam (Klonopine), lorazepam (Ativan), clorazepate (Tranxene), chlordiazepoxide (Librium), estazolam (Prosom), oxazepam (Serax), temazepam (Restoril), triazolam (Halcion) (Last updated:  09/21/2017) ____________________________________________________________________________________________   ____________________________________________________________________________________________  Drug Holidays (Slow)  What is a "Drug Holiday"? Drug Holiday: is the name given to the period of time during which a patient stops taking a medication(s) for the purpose of eliminating tolerance to the drug.  Benefits . Improved effectiveness of opioids. . Decreased opioid dose needed to achieve benefits. . Improved pain with lesser dose.  What is tolerance? Tolerance: is the progressive decreased in effectiveness of a drug due to its repetitive use. With repetitive use, the body gets use to the medication and as a consequence, it loses its effectiveness. This is a common problem seen with opioid pain medications. As a result, a larger dose of the drug is needed to achieve the same effect that used to be obtained with a smaller dose.  How long should a "Drug Holiday" last? At least 14 consecutive days. (2 weeks)  What are withdrawals? Withdrawals: refers to the wide range of symptoms that occur after stopping or dramatically reducing opiate drugs after heavy and prolonged use. Withdrawal symptoms do not occur to patients that use low dose opioids, or those who take the medication sporadically. Contrary to benzodiazepine (example: Valium, Xanax, etc.) or alcohol withdrawals ("Delirium Tremens"), opioid withdrawals are not lethal. Withdrawals are the physical manifestation of the body getting rid of the excess receptors.  Expected Symptoms Early symptoms of withdrawal may include: . Agitation . Anxiety . Muscle aches . Increased tearing . Insomnia . Runny nose . Sweating . Yawning  Late symptoms of withdrawal may include: . Abdominal cramping . Diarrhea . Dilated pupils . Goose bumps . Nausea . Vomiting  Will I experience withdrawals? Due to the slow nature of the taper, it is  very unlikely that you will experience any.  What is a slow taper? Taper: refers to the gradual decrease in dose. ___________________________________________________________________________________________

## 2018-01-08 NOTE — Progress Notes (Signed)
Patient's Name: Keith Gibbs  MRN: [id number]  Referring Provider: Jodi Marble, MD  DOB: 1961-06-13  PCP: Jodi Marble, MD  DOS: 01/09/2018  Note by: Gaspar Cola, MD  Service setting: Ambulatory outpatient  Specialty: Interventional Pain Management  Patient type: Established  Location: [facility] (AMB) Pain Management Facility  Visit type: Interventional Procedure   Primary Reason for Visit: Interventional Pain Management Treatment. CC: Knee Pain (left)  Procedure:  Anesthesia, Analgesia, Anxiolysis:  Type: Therapeutic Intra-Articular Hyalgan Knee Injection #3  Region: Lateral infrapatellar Knee Region Level: Knee Joint Laterality: Left knee  Type: Local Anesthesia Indication(s): Analgesia         Local Anesthetic: Lidocaine 1-2% Route: Infiltration ([place]/IM) IV Access: Declined Sedation: Declined    Indications: 1. Osteoarthritis of the knee (Left)   2. Chronic knee pain (Primary Area of Pain)  (Bilateral) (R>L)    Pain Score: Pre-procedure: 2 /10 Post-procedure: 0-No pain/10  Pre-op Assessment:  Keith Gibbs is a 57 y.o. (year old), male patient, seen today for interventional treatment. He  has a past surgical history that includes Appendectomy; Shoulder surgery (Right, 04/09/2012); Back surgery; Abdominal surgery; Colonoscopy; Cardiac catheterization (Left, 06/10/2016); Cardiac catheterization (N/A, 06/10/2016); Esophagogastroduodenoscopy (egd) with propofol (N/A, 01/05/2017); Colonoscopy with propofol (N/A, 01/05/2017); Coronary angioplasty with stent; and Knee arthroscopy with medial menisectomy (Right, 09/05/2017). Keith Gibbs has a current medication list which includes the following prescription(s): acetaminophen, albuterol, aspirin ec, atorvastatin, bupropion, celecoxib, cetirizine, cyclobenzaprine, divalproex, dulera, hydrocodone-acetaminophen, isosorbide mononitrate, melatonin, meloxicam, nitroglycerin, omeprazole, prazosin, ranolazine, ropinirole, tamsulosin, and  ticagrelor. His primarily concern today is the Knee Pain (left)  Initial Vital Signs:  Pulse/HCG Rate: (!) 54  Temp: 98.4 F (36.9 C) Resp: 16 BP: 118/80 SpO2: 99 %  BMI: Estimated body mass index is 26.5 kg/m as calculated from the following:   Height as of this encounter: 6\' 3"  (1.905 m).   Weight as of this encounter: 212 lb (96.2 kg).  Risk Assessment: Allergies: Reviewed. He is allergic to latuda [lurasidone hcl]; naproxen; and saphris [asenapine].  Allergy Precautions: None required Coagulopathies: Reviewed. None identified.  Blood-thinner therapy: None at this time Active Infection(s): Reviewed. None identified. Keith Gibbs is afebrile  Site Confirmation: Keith Gibbs was asked to confirm the procedure and laterality before marking the site Procedure checklist: Completed Consent: Before the procedure and under the influence of no sedative(s), amnesic(s), or anxiolytics, the patient was informed of the treatment options, risks and possible complications. To fulfill our ethical and legal obligations, as recommended by the American Medical Association's Code of Ethics, I have informed the patient of my clinical impression; the nature and purpose of the treatment or procedure; the risks, benefits, and possible complications of the intervention; the alternatives, including doing nothing; the risk(s) and benefit(s) of the alternative treatment(s) or procedure(s); and the risk(s) and benefit(s) of doing nothing. The patient was provided information about the general risks and possible complications associated with the procedure. These may include, but are not limited to: failure to achieve desired goals, infection, bleeding, organ or nerve damage, allergic reactions, paralysis, and death. In addition, the patient was informed of those risks and complications associated to the procedure, such as failure to decrease pain; infection; bleeding; organ or nerve damage with subsequent damage to  sensory, motor, and/or autonomic systems, resulting in permanent pain, numbness, and/or weakness of one or several areas of the body; allergic reactions; (i.e.: anaphylactic reaction); and/or death. Furthermore, the patient was informed of those risks and complications associated with the medications.  These include, but are not limited to: allergic reactions (i.e.: anaphylactic or anaphylactoid reaction(s)); adrenal axis suppression; blood sugar elevation that in diabetics may result in ketoacidosis or comma; water retention that in patients with history of congestive heart failure may result in shortness of breath, pulmonary edema, and decompensation with resultant heart failure; weight gain; swelling or edema; medication-induced neural toxicity; particulate matter embolism and blood vessel occlusion with resultant organ, and/or nervous system infarction; and/or aseptic necrosis of one or more joints. Finally, the patient was informed that Medicine is not an exact science; therefore, there is also the possibility of unforeseen or unpredictable risks and/or possible complications that may result in a catastrophic outcome. The patient indicated having understood very clearly. We have given the patient no guarantees and we have made no promises. Enough time was given to the patient to ask questions, all of which were answered to the patient's satisfaction. Keith Gibbs has indicated that he wanted to continue with the procedure. Attestation: I, the ordering provider, attest that I have discussed with the patient the benefits, risks, side-effects, alternatives, likelihood of achieving goals, and potential problems during recovery for the procedure that I have provided informed consent. Date  Time: 01/09/2018 10:10 AM  Pre-Procedure Preparation:  Monitoring: As per clinic protocol. Respiration, ETCO2, SpO2, BP, heart rate and rhythm monitor placed and checked for adequate function Safety Precautions: Patient was  assessed for positional comfort and pressure points before starting the procedure. Time-out: I initiated and conducted the "Time-out" before starting the procedure, as per protocol. The patient was asked to participate by confirming the accuracy of the "Time Out" information. Verification of the correct person, site, and procedure were performed and confirmed by me, the nursing staff, and the patient. "Time-out" conducted as per Joint Commission's Universal Protocol (UP.01.01.01). Time: 1031  Description of Procedure:       Position: Sitting Target Area: Knee Joint Approach: Just above the Lateral tibial plateau, lateral to the infrapatellar tendon. Area Prepped: Entire knee area, from the mid-thigh to the mid-shin. Prepping solution: ChloraPrep (2% chlorhexidine gluconate and 70% isopropyl alcohol) Safety Precautions: Aspiration looking for blood return was conducted prior to all injections. At no point did we inject any substances, as a needle was being advanced. No attempts were made at seeking any paresthesias. Safe injection practices and needle disposal techniques used. Medications properly checked for expiration dates. SDV (single dose vial) medications used. Description of the Procedure: Protocol guidelines were followed. The patient was placed in position over the fluoroscopy table. The target area was identified and the area prepped in the usual manner. Skin desensitized using vapocoolant spray. Skin & deeper tissues infiltrated with local anesthetic. Appropriate amount of time allowed to pass for local anesthetics to take effect. The procedure needles were then advanced to the target area. Proper needle placement secured. Negative aspiration confirmed. Solution injected in intermittent fashion, asking for systemic symptoms every 0.5cc of injectate. The needles were then removed and the area cleansed, making sure to leave some of the prepping solution back to take advantage of its long term  bactericidal properties. Vitals:   01/09/18 1009 01/09/18 1033  BP: 118/80 98/75  Pulse: (!) 54 (!) 52  Resp: 16 16  Temp: 98.4 F (36.9 C)   TempSrc: Oral   SpO2: 99% 100%  Weight: 212 lb (96.2 kg)   Height: 6\' 3"  (1.905 m)     Start Time: 1031 hrs. End Time: 1032 hrs. Materials:  Needle(s) Type: Regular needle Gauge: 22G Length:  3.5-in Medication(s): Please see orders for medications and dosing details.  Imaging Guidance:  Type of Imaging Technique: None used Indication(s): N/A Exposure Time: No patient exposure Contrast: None used. Fluoroscopic Guidance: N/A Ultrasound Guidance: N/A Interpretation: N/A  Antibiotic Prophylaxis:   Anti-infectives (From admission, onward)   None     Indication(s): None identified  Post-operative Assessment:  Post-procedure Vital Signs:  Pulse/HCG Rate: (!) 52  Temp: 98.4 F (36.9 C) Resp: 16 BP: 98/75 SpO2: 100 %  EBL: None  Complications: No immediate post-treatment complications observed by team, or reported by patient.  Note: The patient tolerated the entire procedure well. A repeat set of vitals were taken after the procedure and the patient was kept under observation following institutional policy, for this type of procedure. Post-procedural neurological assessment was performed, showing return to baseline, prior to discharge. The patient was provided with post-procedure discharge instructions, including a section on how to identify potential problems. Should any problems arise concerning this procedure, the patient was given instructions to immediately contact us, at any time, without hesitation. In any case, we plan to contact the patient by telephone for a follow-up status report regarding this interventional procedure.  Comments:  No additional relevant information.  Plan of Care   Imaging Orders  No imaging studies ordered today    Procedure Orders     KNEE INJECTION     KNEE INJECTION  Medications ordered  for procedure: Meds ordered this encounter  Medications  . lidocaine (PF) (XYLOCAINE) 1 % injection 4 mL  . ropivacaine (PF) 2 mg/mL (0.2%) (NAROPIN) injection 4 mL  . Sodium Hyaluronate SOSY 2 mL   Medications administered: We administered lidocaine (PF), ropivacaine (PF) 2 mg/mL (0.2%), and Sodium Hyaluronate.  See the medical record for exact dosing, route, and time of administration.  New Prescriptions   No medications on file   Disposition: Discharge home  Discharge Date & Time: 01/09/2018; 1035 hrs.   Physician-requested Follow-up: Return for PPE (2 wks) + Procedure (no sedation): (L) Hyalgan #4.  Future Appointments  Date Time Provider [place]  01/23/2018 11:00 AM Vevelyn Francois, NP [facility] None  02/01/2018 10:15 AM Milinda Pointer, MD [facility] None   Primary Care Physician: Jodi Marble, MD Location: [facility] Outpatient [facility] Management Facility Note by: Gaspar Cola, MD Date: 01/09/2018; Time: 10:40 AM  Disclaimer:  Medicine is not an Chief Strategy Officer. The only guarantee in medicine is that nothing is guaranteed. It is important to note that the decision to proceed with this intervention was based on the information collected from the patient. The Data and conclusions were drawn from the patient's questionnaire, the interview, and the physical examination. Because the information was provided in large part by the patient, it cannot be guaranteed that it has not been purposely or unconsciously manipulated. Every effort has been made to obtain as much relevant data as possible for this evaluation. It is important to note that the conclusions that lead to this procedure are derived in large part from the available data. Always take into account that the treatment will also be dependent on availability of resources and existing treatment guidelines, considered by other Pain Management Practitioners as being common knowledge and practice, at the time of the  intervention. For Medico-Legal purposes, it is also important to point out that variation in procedural techniques and pharmacological choices are the acceptable norm. The indications, contraindications, technique, and results of the above procedure should only be interpreted and judged by a Board-Certified Interventional  Pain Specialist with extensive familiarity and expertise in the same exact procedure and technique.

## 2018-01-09 ENCOUNTER — Encounter: Payer: Self-pay | Admitting: Pain Medicine

## 2018-01-09 ENCOUNTER — Other Ambulatory Visit: Payer: Self-pay

## 2018-01-09 ENCOUNTER — Ambulatory Visit: Payer: Medicaid Other | Attending: Pain Medicine | Admitting: Pain Medicine

## 2018-01-09 VITALS — BP 98/75 | HR 52 | Temp 98.4°F | Resp 16 | Ht 75.0 in | Wt 212.0 lb

## 2018-01-09 DIAGNOSIS — M25562 Pain in left knee: Secondary | ICD-10-CM

## 2018-01-09 DIAGNOSIS — M25561 Pain in right knee: Secondary | ICD-10-CM

## 2018-01-09 DIAGNOSIS — Z9889 Other specified postprocedural states: Secondary | ICD-10-CM | POA: Diagnosis not present

## 2018-01-09 DIAGNOSIS — G8929 Other chronic pain: Secondary | ICD-10-CM

## 2018-01-09 DIAGNOSIS — Z955 Presence of coronary angioplasty implant and graft: Secondary | ICD-10-CM | POA: Insufficient documentation

## 2018-01-09 DIAGNOSIS — M1712 Unilateral primary osteoarthritis, left knee: Secondary | ICD-10-CM

## 2018-01-09 MED ORDER — LIDOCAINE HCL (PF) 1 % IJ SOLN
INTRAMUSCULAR | Status: AC
  Filled 2018-01-09: qty 5

## 2018-01-09 MED ORDER — ROPIVACAINE HCL 2 MG/ML IJ SOLN
INTRAMUSCULAR | Status: AC
  Filled 2018-01-09: qty 10

## 2018-01-09 MED ORDER — LIDOCAINE HCL (PF) 1 % IJ SOLN
4.0000 mL | Freq: Once | INTRAMUSCULAR | Status: AC
  Administered 2018-01-09: 5 mL

## 2018-01-09 MED ORDER — ROPIVACAINE HCL 2 MG/ML IJ SOLN
4.0000 mL | Freq: Once | INTRAMUSCULAR | Status: AC
  Administered 2018-01-09: 10 mL via INTRA_ARTICULAR

## 2018-01-09 MED ORDER — SODIUM HYALURONATE (VISCOSUP) 20 MG/2ML IX SOSY
2.0000 mL | PREFILLED_SYRINGE | Freq: Once | INTRA_ARTICULAR | Status: AC
  Administered 2018-01-09: 2 mL via INTRA_ARTICULAR

## 2018-01-09 NOTE — Progress Notes (Signed)
Safety precautions to be maintained throughout the outpatient stay will include: orient to surroundings, keep bed in low position, maintain call bell within reach at all times, provide assistance with transfer out of bed and ambulation.  

## 2018-01-09 NOTE — Patient Instructions (Signed)
____________________________________________________________________________________________  Post-Procedure Discharge Instructions  Instructions:  Apply ice: Fill a plastic sandwich bag with crushed ice. Cover it with a small towel and apply to injection site. Apply for 15 minutes then remove x 15 minutes. Repeat sequence on day of procedure, until you go to bed. The purpose is to minimize swelling and discomfort after procedure.  Apply heat: Apply heat to procedure site starting the day following the procedure. The purpose is to treat any soreness and discomfort from the procedure.  Food intake: Start with clear liquids (like water) and advance to regular food, as tolerated.   Physical activities: Keep activities to a minimum for the first 8 hours after the procedure.   Driving: If you have received any sedation, you are not allowed to drive for 24 hours after your procedure.  Blood thinner: Restart your blood thinner 6 hours after your procedure. (Only for those taking blood thinners)  Insulin: As soon as you can eat, you may resume your normal dosing schedule. (Only for those taking insulin)  Infection prevention: Keep procedure site clean and dry.  Post-procedure Pain Diary: Extremely important that this be done correctly and accurately. Recorded information will be used to determine the next step in treatment.  Pain evaluated is that of treated area only. Do not include pain from an untreated area.  Complete every hour, on the hour, for the initial 8 hours. Set an alarm to help you do this part accurately.  Do not go to sleep and have it completed later. It will not be accurate.  Follow-up appointment: Keep your follow-up appointment after the procedure. Usually 2 weeks for most procedures. (6 weeks in the case of radiofrequency.) Bring you pain diary.   Expect:  From numbing medicine (AKA: Local Anesthetics): Numbness or decrease in pain.  Onset: Full effect within 15  minutes of injected.  Duration: It will depend on the type of local anesthetic used. On the average, 1 to 8 hours.   From steroids: Decrease in swelling or inflammation. Once inflammation is improved, relief of the pain will follow.  Onset of benefits: Depends on the amount of swelling present. The more swelling, the longer it will take for the benefits to be seen. In some cases, up to 10 days.  Duration: Steroids will stay in the system x 2 weeks. Duration of benefits will depend on multiple posibilities including persistent irritating factors.  From procedure: Some discomfort is to be expected once the numbing medicine wears off. This should be minimal if ice and heat are applied as instructed.  Call if:  You experience numbness and weakness that gets worse with time, as opposed to wearing off.  New onset bowel or bladder incontinence. (This applies to Spinal procedures only)  Emergency Numbers:  Durning business hours (Monday - Thursday, 8:00 AM - 4:00 PM) (Friday, 9:00 AM - 12:00 Noon): [phone]  After hours: [phone] ____________________________________________________________________________________________   ____________________________________________________________________________________________  Preparing for your procedure (without sedation)  Instructions: . Oral Intake: Do not eat or drink anything for at least 3 hours prior to your procedure. . Transportation: Unless otherwise stated by your physician, you may drive yourself after the procedure. . Blood Pressure Medicine: Take your blood pressure medicine with a sip of water the morning of the procedure. . Blood thinners:  . Diabetics on insulin: Notify the staff so that you can be scheduled 1st case in the morning. If your diabetes requires high dose insulin, take only  of your normal insulin dose   the morning of the procedure and notify the staff that you have done so. . Preventing infections: Shower  with an antibacterial soap the morning of your procedure.  . Build-up your immune system: Take 1000 mg of Vitamin C with every meal (3 times a day) the day prior to your procedure. . Antibiotics: Inform the staff if you have a condition or reason that requires you to take antibiotics before dental procedures. . Pregnancy: If you are pregnant, call and cancel the procedure. . Sickness: If you have a cold, fever, or any active infections, call and cancel the procedure. . Arrival: You must be in the facility at least 30 minutes prior to your scheduled procedure. . Children: Do not bring any children with you. . Dress appropriately: Bring dark clothing that you would not mind if they get stained. . Valuables: Do not bring any jewelry or valuables.  Procedure appointments are reserved for interventional treatments only. . No Prescription Refills. . No medication changes will be discussed during procedure appointments. . No disability issues will be discussed.  Remember:  Regular Business hours are:  Monday to Thursday 8:00 AM to 4:00 PM  Provider's Schedule: Roxana Lai, MD:  Procedure days: Tuesday and Thursday 7:30 AM to 4:00 PM  Bilal Lateef, MD:  Procedure days: Monday and Wednesday 7:30 AM to 4:00 PM ____________________________________________________________________________________________    

## 2018-01-10 ENCOUNTER — Telehealth: Payer: Self-pay | Admitting: *Deleted

## 2018-01-10 NOTE — Telephone Encounter (Signed)
No voicemail capability on home # and non working # on [employer].  Unable to check with patient re: injection.

## 2018-01-23 ENCOUNTER — Ambulatory Visit: Payer: Medicaid Other | Attending: Nurse Practitioner | Admitting: Nurse Practitioner

## 2018-01-23 ENCOUNTER — Encounter: Payer: Self-pay | Admitting: Nurse Practitioner

## 2018-01-23 ENCOUNTER — Other Ambulatory Visit: Payer: Self-pay

## 2018-01-23 VITALS — BP 165/100 | HR 63 | Temp 98.2°F | Resp 16 | Ht 75.0 in | Wt 215.0 lb

## 2018-01-23 DIAGNOSIS — J449 Chronic obstructive pulmonary disease, unspecified: Secondary | ICD-10-CM | POA: Diagnosis not present

## 2018-01-23 DIAGNOSIS — M5136 Other intervertebral disc degeneration, lumbar region: Secondary | ICD-10-CM | POA: Diagnosis not present

## 2018-01-23 DIAGNOSIS — M5030 Other cervical disc degeneration, unspecified cervical region: Secondary | ICD-10-CM | POA: Diagnosis not present

## 2018-01-23 DIAGNOSIS — M19011 Primary osteoarthritis, right shoulder: Secondary | ICD-10-CM | POA: Diagnosis not present

## 2018-01-23 DIAGNOSIS — F17210 Nicotine dependence, cigarettes, uncomplicated: Secondary | ICD-10-CM | POA: Diagnosis not present

## 2018-01-23 DIAGNOSIS — M79641 Pain in right hand: Secondary | ICD-10-CM | POA: Diagnosis not present

## 2018-01-23 DIAGNOSIS — G894 Chronic pain syndrome: Secondary | ICD-10-CM | POA: Diagnosis not present

## 2018-01-23 DIAGNOSIS — M545 Other chronic pain: Secondary | ICD-10-CM

## 2018-01-23 DIAGNOSIS — M47817 Spondylosis without myelopathy or radiculopathy, lumbosacral region: Secondary | ICD-10-CM | POA: Insufficient documentation

## 2018-01-23 DIAGNOSIS — Z79899 Other long term (current) drug therapy: Secondary | ICD-10-CM | POA: Insufficient documentation

## 2018-01-23 DIAGNOSIS — M47812 Spondylosis without myelopathy or radiculopathy, cervical region: Secondary | ICD-10-CM | POA: Diagnosis not present

## 2018-01-23 DIAGNOSIS — F209 Schizophrenia, unspecified: Secondary | ICD-10-CM | POA: Diagnosis not present

## 2018-01-23 DIAGNOSIS — K219 Gastro-esophageal reflux disease without esophagitis: Secondary | ICD-10-CM | POA: Insufficient documentation

## 2018-01-23 DIAGNOSIS — Z8673 Personal history of transient ischemic attack (TIA), and cerebral infarction without residual deficits: Secondary | ICD-10-CM | POA: Diagnosis not present

## 2018-01-23 DIAGNOSIS — Z5181 Encounter for therapeutic drug level monitoring: Secondary | ICD-10-CM | POA: Diagnosis present

## 2018-01-23 DIAGNOSIS — M7918 Myalgia, other site: Secondary | ICD-10-CM | POA: Diagnosis not present

## 2018-01-23 DIAGNOSIS — M25562 Pain in left knee: Secondary | ICD-10-CM

## 2018-01-23 DIAGNOSIS — Z8249 Family history of ischemic heart disease and other diseases of the circulatory system: Secondary | ICD-10-CM | POA: Insufficient documentation

## 2018-01-23 DIAGNOSIS — F319 Bipolar disorder, unspecified: Secondary | ICD-10-CM | POA: Diagnosis not present

## 2018-01-23 DIAGNOSIS — I10 Essential (primary) hypertension: Secondary | ICD-10-CM | POA: Insufficient documentation

## 2018-01-23 DIAGNOSIS — M1712 Unilateral primary osteoarthritis, left knee: Secondary | ICD-10-CM | POA: Diagnosis not present

## 2018-01-23 DIAGNOSIS — Z9889 Other specified postprocedural states: Secondary | ICD-10-CM | POA: Insufficient documentation

## 2018-01-23 DIAGNOSIS — M25511 Pain in right shoulder: Secondary | ICD-10-CM | POA: Diagnosis not present

## 2018-01-23 DIAGNOSIS — M160 Bilateral primary osteoarthritis of hip: Secondary | ICD-10-CM | POA: Diagnosis not present

## 2018-01-23 DIAGNOSIS — M25561 Pain in right knee: Secondary | ICD-10-CM

## 2018-01-23 DIAGNOSIS — G8929 Other chronic pain: Secondary | ICD-10-CM

## 2018-01-23 DIAGNOSIS — B182 Chronic viral hepatitis C: Secondary | ICD-10-CM | POA: Insufficient documentation

## 2018-01-23 MED ORDER — KETOROLAC TROMETHAMINE 60 MG/2ML IM SOLN
60.0000 mg | Freq: Once | INTRAMUSCULAR | Status: AC
  Administered 2018-01-23: 60 mg via INTRAMUSCULAR

## 2018-01-23 MED ORDER — ORPHENADRINE CITRATE 30 MG/ML IJ SOLN
60.0000 mg | Freq: Once | INTRAMUSCULAR | Status: AC
  Administered 2018-01-23: 60 mg via INTRAMUSCULAR

## 2018-01-23 MED ORDER — HYDROCODONE-ACETAMINOPHEN 5-325 MG PO TABS: 1.0000 | ORAL_TABLET | Freq: Four times a day (QID) | ORAL | 0 refills | Status: DC | PRN

## 2018-01-23 MED ORDER — ORPHENADRINE CITRATE 30 MG/ML IJ SOLN
INTRAMUSCULAR | Status: AC
  Filled 2018-01-23: qty 2

## 2018-01-23 MED ORDER — CYCLOBENZAPRINE HCL 10 MG PO TABS: 10.0000 mg | ORAL_TABLET | Freq: Three times a day (TID) | ORAL | 0 refills | Status: DC | PRN

## 2018-01-23 MED ORDER — KETOROLAC TROMETHAMINE 60 MG/2ML IM SOLN
INTRAMUSCULAR | Status: AC
  Filled 2018-01-23: qty 2

## 2018-01-23 NOTE — Progress Notes (Signed)
Patient's Name: Keith Gibbs  MRN: [id number]  Referring Provider: Jodi Marble, MD  DOB: March 15, 1961  PCP: Jodi Marble, MD  DOS: 01/23/2018  Note by: Vevelyn Francois NP  Service setting: Ambulatory outpatient  Specialty: Interventional Pain Management  Location: [facility] (AMB) Pain Management Facility    Patient type: Established    Primary Reason(s) for Visit: Encounter for prescription drug management. (Level of risk: moderate)  CC: Back Pain (lower); Hand Pain (right); and Shoulder Pain (right)  HPI  Keith Gibbs is a 57 y.o. year old, male patient, who comes today for a medication management evaluation. He has Schizophrenia ([place]); COPD (chronic obstructive pulmonary disease) ([place]); Chronic hepatitis C ([place]); GERD (gastroesophageal reflux disease); Swollen testicle; Abnormal ejaculation; Chest pain, unspecified; Bipolar 1 disorder ([place]); Cocaine abuse in remission ([place]); Chest pain; Angina at rest [facility]); Severe recurrent major depression without psychotic features ([place]); Coronary artery disease; Alcohol use disorder, moderate, dependence ([place]); Cocaine use disorder, moderate, dependence ([place]); Cannabis use disorder, moderate, dependence ([place]); Overdose of medication, intentional self-harm, sequela ([place]); Tobacco use disorder; Chronic knee pain (Primary Area of Pain)  (Bilateral) (R>L); Chronic low back pain (Secondary Area of Pain) (Bilateral); Chronic hip pain (Tertiary Area of Pain) (Bilateral) (R>L); Chronic shoulder pain (Fourth Area of Pain) (Right); Spondylosis without myelopathy or radiculopathy, cervical region; Chronic pain syndrome; Long term current use of opiate analgesic; Opiate use; Pharmacologic therapy; Disorder of skeletal system; Problems influencing health status; 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); Chronic neck pain (Fifth Area of Pain) ([place]); Chronic musculoskeletal pain; 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 His primarily concern today is the Back Pain (lower); Hand Pain (right); and Shoulder Pain (right)  Pain Assessment: Location: Lower Back(Piercing) Radiating: denies Onset: More than a month ago Duration: Chronic pain Quality: Aching, Discomfort Severity: 5 /10 (subjective, self-reported pain score)  Note: Reported level is compatible with observation.                          Effect on ADL: hard to exercise, ROM, prolonged walking Timing: Intermittent Modifying factors: medication BP: (!) 165/100  HR: 63  Keith Gibbs was last scheduled for an appointment on 12/28/2017 for medication management. During today's appointment we reviewed Keith Gibbs's chronic pain status, as well as his outpatient medication regimen. He admits that he continues to have pain in his right hand. He admits that he has tried to change his diet to help reduce the inflammation. He continues to smoke despite his COPD.   The patient  reports that he does not use drugs. His body mass index is 26.87 kg/m.  Further details on both, my assessment(s), as well as the proposed treatment plan, please see below.  Controlled Substance Pharmacotherapy Assessment REMS (Risk Evaluation and Mitigation Strategy)  Analgesic:Hydrocodone/acetaminophen 5/325 mg 1 tablet in the 6hours (20 mg/dayof Hydrocodone) MME/day:72m/day   GIgnatius Specking RN  01/23/2018 11:21 AM  Sign at close encounter Nursing Pain Medication Assessment:  Safety precautions to be  maintained throughout the outpatient stay will include: orient to surroundings, keep bed in low position, maintain call bell within  reach at all times, provide assistance with transfer out of bed and ambulation.  Medication Inspection Compliance: Pill count conducted under aseptic conditions, in front of the patient. Neither the pills nor the bottle was removed from the patient's sight at any time. Once count was completed pills were immediately returned to the patient in their original bottle.  Medication: Oxycodone/APAP Pill/Patch Count: 26 of 120 pills remain Pill/Patch Appearance: Markings consistent with prescribed medication Bottle Appearance: Standard pharmacy container. Clearly labeled. Filled Date: 6 / 8 / 2019 Last Medication intake:  Today   Pharmacokinetics: Liberation and absorption (onset of action): WNL Distribution (time to peak effect): WNL Metabolism and excretion (duration of action): WNL         Pharmacodynamics: Desired effects: Analgesia: Keith Gibbs reports >50% benefit. Functional ability: Patient reports that medication allows him to accomplish basic ADLs Clinically meaningful improvement in function (CMIF): Sustained CMIF goals met Perceived effectiveness: Described as relatively effective, allowing for increase in activities of daily living (ADL) Undesirable effects: Side-effects or Adverse reactions: None reported Monitoring: [place] PMP: Online review of the past 22-monthperiod conducted. Compliant with practice rules and regulations Last UDS on record: Summary  Date Value Ref Range Status  09/11/2017 FINAL  Final    Comment:    ==================================================================== TOXASSURE COMP DRUG ANALYSIS,UR ==================================================================== Test                             Result       Flag       Units Drug Present and Declared for Prescription Verification   Hydrocodone                    1255          EXPECTED   ng/mg creat   Dihydrocodeine                 168          EXPECTED   ng/mg creat   Norhydrocodone                 820          EXPECTED   ng/mg creat    Sources of hydrocodone include scheduled prescription    medications. Dihydrocodeine and norhydrocodone are expected    metabolites of hydrocodone. Dihydrocodeine is also available as a    scheduled prescription medication.   Desmethylcyclobenzaprine       PRESENT      EXPECTED    Desmethylcyclobenzaprine is an expected metabolite of    cyclobenzaprine.   Bupropion                      PRESENT      EXPECTED   Hydroxybupropion               PRESENT      EXPECTED    Hydroxybupropion is an expected metabolite of bupropion.   Acetaminophen                  PRESENT      EXPECTED Drug Present not Declared for Prescription Verification   Guaifenesin                    PRESENT      UNEXPECTED    Guaifenesin may be administered as an over-the-counter or    prescription drug; it may also be present as a breakdown product  of methocarbamol. Drug Absent but Declared for Prescription Verification   Salicylate                     Not Detected UNEXPECTED    Aspirin, as indicated in the declared medication list, is not    always detected even when used as directed. ==================================================================== Test                      Result    Flag   Units      Ref Range   Creatinine              74               mg/dL      >=20 ==================================================================== Declared Medications:  The flagging and interpretation on this report are based on the  following declared medications.  Unexpected results may arise from  inaccuracies in the declared medications.  **Note: The testing scope of this panel includes these medications:  Bupropion  Cyclobenzaprine  Hydrocodone (Hydrocodone-Acetaminophen)  **Note: The testing scope of this panel does not include small to  moderate amounts  of these reported medications:  Acetaminophen  Acetaminophen (Hydrocodone-Acetaminophen)  Aspirin (Aspirin 81)  **Note: The testing scope of this panel does not include following  reported medications:  Albuterol  Atorvastatin  Cetirizine  Isosorbide Mononitrate  Melatonin  Mometasone  Multivitamin  Nitroglycerin  Omeprazole  Prazosin (Minipress)  Ranolazine (Ranexa)  Ropinirole (Requip)  Tamsulosin (Flomax)  Ticagrelor (Brilinta) ==================================================================== For clinical consultation, please call [phone]. ====================================================================    UDS interpretation: Compliant          Medication Assessment Form: Reviewed. Patient indicates being compliant with therapy Treatment compliance: Compliant Risk Assessment Profile: Aberrant behavior: See prior evaluations. None observed or detected today Comorbid factors increasing risk of overdose: See prior notes. No additional risks detected today Risk of substance use disorder (SUD): Low Opioid Risk Tool - 12/26/17 0906      Family History of Substance Abuse   Alcohol  Positive Male    Illegal Drugs  Negative    Rx Drugs  Negative      Personal History of Substance Abuse   Alcohol  Positive Male or Male    Illegal Drugs  Positive Male or Male    Rx Drugs  Negative      Age   Age between 80-45 years   No      History of Preadolescent Sexual Abuse   History of Preadolescent Sexual Abuse  Negative or Male      Psychological Disease   Psychological Disease  Positive    ADD  Negative    OCD  Negative    Bipolar  Positive    Schizophrenia  Positive    Depression  Positive      Total Score   Opioid Risk Tool Scoring  13    Opioid Risk Interpretation  High Risk      ORT Scoring interpretation table:  Score <3 = Low Risk for SUD  Score between 4-7 = Moderate Risk for SUD  Score >8 = High Risk for Opioid Abuse   Risk Mitigation  Strategies:  Patient Counseling: Covered Patient-Prescriber Agreement (PPA): Present and active  Notification to other healthcare providers: Done  Pharmacologic Plan: No change in therapy, at this time.             Laboratory Chemistry  Inflammation Markers (CRP: Acute Phase) (ESR: Chronic Phase) Lab Results  Component Value Date   CRP 24.6 (H) 09/11/2017   ESRSEDRATE 16 09/11/2017   LATICACIDVEN 1.2 02/20/2017                         Rheumatology Markers Lab Results  Component Value Date   ANA Negative 11/01/2016                        Renal Function Markers Lab Results  Component Value Date   BUN 11 09/11/2017   CREATININE 0.98 09/11/2017   BCR 11 09/11/2017   GFRAA 99 09/11/2017   GFRNONAA 85 09/11/2017                             Hepatic Function Markers Lab Results  Component Value Date   AST 15 09/11/2017   ALT 31 02/20/2017   ALBUMIN 4.2 09/11/2017   ALKPHOS 71 09/11/2017   HCVAB >11.0 (H) 11/01/2016   LIPASE 45 02/20/2017                        Electrolytes Lab Results  Component Value Date   NA 139 09/11/2017   K 4.7 09/11/2017   CL 99 09/11/2017   CALCIUM 9.0 09/11/2017   MG 2.1 09/11/2017                        Neuropathy Markers Lab Results  Component Value Date   VITAMINB12 457 09/11/2017   HGBA1C 5.2 12/28/2016                        Bone Pathology Markers Lab Results  Component Value Date   25OHVITD1 35 09/11/2017   25OHVITD2 <1.0 09/11/2017   25OHVITD3 35 09/11/2017                         Coagulation Parameters Lab Results  Component Value Date   INR 1.02 11/01/2016   LABPROT 13.4 11/01/2016   PLT 249 08/30/2017                        Cardiovascular Markers Lab Results  Component Value Date   BNP 12.0 12/26/2016   TROPONINI <0.03 02/20/2017   HGB 15.4 08/30/2017   HCT 44.9 08/30/2017                         CA Markers No results found for: CEA, CA125, LABCA2                      Note: Lab results  reviewed.  Recent Diagnostic Imaging Results  DG Knee Complete 4 Views Right CLINICAL DATA:  Fall  EXAM: RIGHT KNEE - COMPLETE 4+ VIEW  COMPARISON:  03/04/2017  FINDINGS: No acute displaced fracture or malalignment. Mild patellofemoral degenerative change. Mild to moderate medial compartment degenerative change and mild lateral compartment degenerative change. Small to moderate knee effusion.  IMPRESSION: 1. No acute osseous abnormality. 2. Knee effusion  Electronically Signed   By: Donavan Foil M.D.   On: 10/27/2017 03:29 DG Lumbar Spine Complete CLINICAL DATA:  Fall  EXAM: LUMBAR SPINE - COMPLETE 4+ VIEW  COMPARISON:  08/14/2017, 01/19/2017  FINDINGS: Stable lumbar alignment. Status post posterior rod and fixating screws L4 through S1  with interbody devices at L4-L5 and L5-S1. Chronic superior and anterior endplate deformity at L3 with limbus vertebra. Mild degenerative changes at L1-L2. Vertebral body heights are maintained.  IMPRESSION: 1. No acute osseous abnormality 2. Postsurgical changes L4 through S1.  Electronically Signed   By: Donavan Foil M.D.   On: 10/27/2017 03:25 DG Cervical Spine Complete CLINICAL DATA:  Fall  EXAM: CERVICAL SPINE - COMPLETE 4+ VIEW  COMPARISON:  09/19/2017  FINDINGS: Dens and lateral masses are within normal limits. Cervical alignment within normal limits. Moderate degenerative changes at C5-C6 and C6-C7. Bilateral foraminal narrowing at these levels. Prevertebral soft tissue thickness is normal  IMPRESSION: No radiographic evidence for acute osseous abnormality. Moderate degenerative changes at C5-C6 and C6-C7  Electronically Signed   By: Donavan Foil M.D.   On: 10/27/2017 03:22  Complexity Note: Imaging results reviewed. Results shared with Mr. Knappenberger, using Layman's terms.                         Meds   Current Outpatient Medications:  .  acetaminophen (TYLENOL) 500 MG tablet, Take 2 tablets (1,000 mg  total) by mouth every 6 (six) hours as needed. For pain., Disp: 90 tablet, Rfl: 1 .  albuterol (PROAIR HFA) 108 (90 Base) MCG/ACT inhaler, Inhale 2 puffs into the lungs every 6 (six) hours as needed for wheezing or shortness of breath., Disp: 1 Inhaler, Rfl: 1 .  aspirin EC 81 MG tablet, Take 1 tablet (81 mg total) by mouth daily. (Patient taking differently: Take 325 mg by mouth daily. ), Disp: 30 tablet, Rfl: 1 .  atorvastatin (LIPITOR) 40 MG tablet, Take 1 tablet (40 mg total) by mouth daily at 6 PM., Disp: 30 tablet, Rfl: 1 .  buPROPion (WELLBUTRIN SR) 100 MG 12 hr tablet, Take 100 mg by mouth 2 (two) times daily., Disp: , Rfl:  .  celecoxib (CELEBREX) 200 MG capsule, Take 200 mg by mouth 2 (two) times daily., Disp: , Rfl:  .  cetirizine (ZYRTEC) 10 MG tablet, Take 10 mg by mouth daily., Disp: , Rfl:  .  [START ON 01/30/2018] cyclobenzaprine (FLEXERIL) 10 MG tablet, Take 1 tablet (10 mg total) by mouth 3 (three) times daily as needed for muscle spasms., Disp: 90 tablet, Rfl: 0 .  divalproex (DEPAKOTE) 500 MG DR tablet, Take 500 mg by mouth 2 (two) times daily., Disp: , Rfl: 0 .  DULERA 200-5 MCG/ACT AERO, TAKE 2 PUFFS BY MOUTH TWICE A DAY, Disp: 1 Inhaler, Rfl: 1 .  [START ON 01/30/2018] HYDROcodone-acetaminophen (NORCO) 5-325 MG tablet, Take 1 tablet by mouth every 6 (six) hours as needed for severe pain. Max.: 4/day. Must Last 30 days., Disp: 120 tablet, Rfl: 0 .  isosorbide mononitrate (IMDUR) 30 MG 24 hr tablet, Take 1 tablet (30 mg total) by mouth daily. (Patient taking differently: Take 60 mg by mouth daily. ), Disp: 30 tablet, Rfl: 1 .  Melatonin 3 MG TABS, Take 3 mg by mouth at bedtime., Disp: , Rfl:  .  meloxicam (MOBIC) 7.5 MG tablet, Take 7.5 mg by mouth daily., Disp: , Rfl:  .  nitroGLYCERIN (NITROSTAT) 0.4 MG SL tablet, Place 1 tablet (0.4 mg total) under the tongue every 5 (five) minutes x 3 doses as needed for chest pain., Disp: 30 tablet, Rfl: 0 .  omeprazole (PRILOSEC) 40 MG capsule,  Take 40 mg by mouth daily., Disp: , Rfl:  .  prazosin (MINIPRESS) 1 MG capsule, Take 1 capsule (  1 mg total) by mouth at bedtime., Disp: 30 capsule, Rfl: 1 .  ranolazine (RANEXA) 500 MG 12 hr tablet, Take 500 mg by mouth 2 (two) times daily., Disp: , Rfl:  .  rOPINIRole (REQUIP) 0.5 MG tablet, TAKE 1 TO 2 TABLETS BY MOUTH TWICE DAILY AS NEEDED FOR RESTLESS LEGS., Disp: , Rfl: 1 .  tamsulosin (FLOMAX) 0.4 MG CAPS capsule, Take 1 capsule (0.4 mg total) by mouth daily., Disp: 30 capsule, Rfl: 1 .  ticagrelor (BRILINTA) 60 MG TABS tablet, Take 60 mg by mouth 2 (two) times daily. , Disp: , Rfl:   ROS  Constitutional: Denies any fever or chills Gastrointestinal: No reported hemesis, hematochezia, vomiting, or acute GI distress Musculoskeletal: Denies any acute onset joint swelling, redness, loss of ROM, or weakness Neurological: No reported episodes of acute onset apraxia, aphasia, dysarthria, agnosia, amnesia, paralysis, loss of coordination, or loss of consciousness  Allergies  Mr. Kawabata is allergic to latuda [lurasidone hcl]; naproxen; and saphris [asenapine].  [place]  Drug: Mr. Krapf  reports that he does not use drugs. Alcohol:  reports that he drinks alcohol. Tobacco:  reports that he has been smoking cigarettes.  He has been smoking about 0.50 packs per day. He has never used smokeless tobacco. Medical:  has a past medical history of Anxiety, Arthritis, Bursitis, COPD (chronic obstructive pulmonary disease) ([place]), Depression, GERD (gastroesophageal reflux disease), Hepatitis C (2012), Hypertension, Lung disease, MI (myocardial infarction) ([place]), and Stroke ([place]). Surgical: Mr. Stick  has a past surgical history that includes Appendectomy; Shoulder surgery (Right, 04/09/2012); Back surgery; Abdominal surgery; Colonoscopy; Cardiac catheterization (Left, 06/10/2016); Cardiac catheterization (N/A, 06/10/2016); Esophagogastroduodenoscopy (egd) with propofol (N/A, 01/05/2017); Colonoscopy with  propofol (N/A, 01/05/2017); Coronary angioplasty with stent; and Knee arthroscopy with medial menisectomy (Right, 09/05/2017). Family: family history includes Early death in his father; Heart disease in his father and mother; Heart failure in his father; Hypertension in his father, mother, and sister; Osteoarthritis in his mother.  Constitutional Exam  General appearance: Well nourished, well developed, and well hydrated. In no apparent acute distress Vitals:   01/23/18 1110  BP: (!) 165/100  Pulse: 63  Resp: 16  Temp: 98.2 F (36.8 C)  SpO2: 100%  Weight: 215 lb (97.5 kg)  Height: 6' 3"  (1.905 m)  Psych/Mental status: Alert, oriented x 3 (person, place, & time)       Eyes: PERLA Respiratory: No evidence of acute respiratory distress  Cervical Spine Area Exam  Skin & Axial Inspection: No masses, redness, edema, swelling, or associated skin lesions Alignment: Symmetrical Functional ROM: Unrestricted ROM      Stability: No instability detected Muscle Tone/Strength: Functionally intact. No obvious neuro-muscular anomalies detected. Sensory (Neurological): Unimpaired Palpation: No palpable anomalies              Upper Extremity (UE) Exam    Side: Right upper extremity  Side: Left upper extremity  Skin & Extremity Inspection: Edema  Skin & Extremity Inspection: Skin color, temperature, and hair growth are WNL. No peripheral edema or cyanosis. No masses, redness, swelling, asymmetry, or associated skin lesions. No contractures.  Functional ROM: Unrestricted ROM          Functional ROM: Unrestricted ROM          Muscle Tone/Strength: Functionally intact. No obvious neuro-muscular anomalies detected.  Muscle Tone/Strength: Functionally intact. No obvious neuro-muscular anomalies detected.  Sensory (Neurological): Unimpaired          Sensory (Neurological): Unimpaired  Palpation: No palpable anomalies              Palpation: No palpable anomalies              Provocative Test(s):   Phalen's test: deferred Tinel's test: deferred Apley's scratch test (touch opposite shoulder):  Action 1 (Across chest): deferred Action 2 (Overhead): deferred Action 3 (LB reach): deferred   Provocative Test(s):  Phalen's test: deferred Tinel's test: deferred Apley's scratch test (touch opposite shoulder):  Action 1 (Across chest): deferred Action 2 (Overhead): deferred Action 3 (LB reach): deferred    Thoracic Spine Area Exam  Skin & Axial Inspection: No masses, redness, or swelling Alignment: Symmetrical Functional ROM: Unrestricted ROM Stability: No instability detected Muscle Tone/Strength: Functionally intact. No obvious neuro-muscular anomalies detected. Sensory (Neurological): Unimpaired Muscle strength & Tone: No palpable anomalies  Lumbar Spine Area Exam  Skin & Axial Inspection: No masses, redness, or swelling Alignment: Symmetrical Functional ROM: Unrestricted ROM       Stability: No instability detected Muscle Tone/Strength: Functionally intact. No obvious neuro-muscular anomalies detected. Sensory (Neurological): Unimpaired Palpation: No palpable anomalies       Provocative Tests: Lumbar Hyperextension/rotation test: deferred today       Lumbar quadrant test (Kemp's test): deferred today       Lumbar Lateral bending test: deferred today       Patrick's Maneuver: deferred today                   FABER test: deferred today       Thigh-thrust test: deferred today       S-I compression test: deferred today       S-I distraction test: deferred today        Gait & Posture Assessment  Ambulation: Unassisted Gait: Relatively normal for age and body habitus Posture: WNL   Lower Extremity Exam    Side: Right lower extremity  Side: Left lower extremity  Stability: No instability observed          Stability: No instability observed          Skin & Extremity Inspection: Skin color, temperature, and hair growth are WNL. No peripheral edema or cyanosis. No masses,  redness, swelling, asymmetry, or associated skin lesions. No contractures.  Skin & Extremity Inspection: Skin color, temperature, and hair growth are WNL. No peripheral edema or cyanosis. No masses, redness, swelling, asymmetry, or associated skin lesions. No contractures.  Functional ROM: Unrestricted ROM                  Functional ROM: Unrestricted ROM                  Muscle Tone/Strength: Functionally intact. No obvious neuro-muscular anomalies detected.  Muscle Tone/Strength: Functionally intact. No obvious neuro-muscular anomalies detected.  Sensory (Neurological): Unimpaired  Sensory (Neurological): Unimpaired  Palpation: No palpable anomalies  Palpation: No palpable anomalies   Assessment  Primary Diagnosis & Pertinent Problem List: The primary encounter diagnosis was Spondylosis without myelopathy or radiculopathy, cervical region. Diagnoses of Chronic low back pain (Secondary Area of Pain) (Bilateral), Chronic knee pain (Primary Area of Pain)  (Bilateral) (R>L), Chronic musculoskeletal pain, and Chronic pain syndrome were also pertinent to this visit.  Status Diagnosis  Persistent Persistent Stable 1. Spondylosis without myelopathy or radiculopathy, cervical region   2. Chronic low back pain (Secondary Area of Pain) (Bilateral)   3. Chronic knee pain (Primary Area of Pain)  (Bilateral) (R>L)   4. Chronic musculoskeletal pain  5. Chronic pain syndrome     Problems updated and reviewed during this visit: No problems updated. Plan of Care  Pharmacotherapy (Medications Ordered): Meds ordered this encounter  Medications  . cyclobenzaprine (FLEXERIL) 10 MG tablet    Sig: Take 1 tablet (10 mg total) by mouth 3 (three) times daily as needed for muscle spasms.    Dispense:  90 tablet    Refill:  0    Do not place medication on "Automatic Refill". Fill one day early if pharmacy is closed on scheduled refill date.    Order Specific Question:   Supervising Provider    Answer:    Milinda Pointer [phone]  . HYDROcodone-acetaminophen (NORCO) 5-325 MG tablet    Sig: Take 1 tablet by mouth every 6 (six) hours as needed for severe pain. Max.: 4/day. Must Last 30 days.    Dispense:  120 tablet    Refill:  0    Fill one day early if pharmacy is closed on scheduled refill date. Do not fill until: 01/30/2018 To last until: 03/01/2018    Order Specific Question:   Supervising Provider    Answer:   Milinda Pointer [phone]  . orphenadrine (NORFLEX) injection 60 mg  . ketorolac (TORADOL) injection 60 mg   New Prescriptions   No medications on file   Medications administered today: We administered orphenadrine and ketorolac. Lab-work, procedure(s), and/or referral(s): No orders of the defined types were placed in this encounter.  Imaging and/or referral(s): None  Interventional management options: Planned, scheduled, and/or pending: Therapeuticleft intra-articular Hyalgan knee injection #4   Considering: Diagnostic left intra-articularHyalganknee injectionseries Diagnostic bilateral genicular nerve block Possiblebilateral genicular nerve RFA Diagnostic lumbar facet nerve block Possible bilateral lumbar facet RFA Diagnostic bilateral hip injections Diagnosticright suprascapular nerve block Possible right suprascapular nerve RFA Diagnosticbilateral cervical facet nerve block Possible bilateral cervical RFA   Palliative PRN treatment(s): None at this time     Provider-requested follow-up: Return in about 1 month (around 02/20/2018) for MedMgmt with Me Dionisio David), in addition, Appointment As Scheduled.  Future Appointments  Date Time Provider [place]  02/01/2018 10:15 AM Milinda Pointer, MD [facility] None  02/20/2018 11:15 AM Vevelyn Francois, NP [facility] None   Primary Care Physician: Jodi Marble, MD Location: [facility] Note by: Vevelyn Francois NP Date: 01/23/2018; Time: 2:49  PM  Pain Score Disclaimer: We use the NRS-11 scale. This is a self-reported, subjective measurement of pain severity with only modest accuracy. It is used primarily to identify changes within a particular patient. It must be understood that outpatient pain scales are significantly less accurate that those used for research, where they can be applied under ideal controlled circumstances with minimal exposure to variables. In reality, the score is likely to be a combination of pain intensity and pain affect, where pain affect describes the degree of emotional arousal or changes in action readiness caused by the sensory experience of pain. Factors such as social and work situation, setting, emotional state, anxiety levels, expectation, and prior pain experience may influence pain perception and show large inter-individual differences that may also be affected by time variables.  Patient instructions provided during this appointment: Patient Instructions  ______You have been gicven a script for Hydrocodone.  You  Have Flexeril sent in to your pharmacy.  You have been given a Toradol, Norflex injection today. ______________________________________________________________________________________  Medication Rules  Applies to: All patients receiving prescriptions (written or electronic).  Pharmacy of record: Pharmacy where electronic prescriptions  will be sent. If written prescriptions are taken to a different pharmacy, please inform the nursing staff. The pharmacy listed in the electronic medical record should be the one where you would like electronic prescriptions to be sent.  Prescription refills: Only during scheduled appointments. Applies to both, written and electronic prescriptions.  NOTE: The following applies primarily to controlled substances (Opioid* Pain Medications).   Patient's responsibilities: 1. Pain Pills: Bring all pain pills to every appointment (except for procedure  appointments). 2. Pill Bottles: Bring pills in original pharmacy bottle. Always bring newest bottle. Bring bottle, even if empty. 3. Medication refills: You are responsible for knowing and keeping track of what medications you need refilled. The day before your appointment, write a list of all prescriptions that need to be refilled. Bring that list to your appointment and give it to the admitting nurse. Prescriptions will be written only during appointments. If you forget a medication, it will not be "Called in", "Faxed", or "electronically sent". You will need to get another appointment to get these prescribed. 4. Prescription Accuracy: You are responsible for carefully inspecting your prescriptions before leaving our office. Have the discharge nurse carefully go over each prescription with you, before taking them home. Make sure that your name is accurately spelled, that your address is correct. Check the name and dose of your medication to make sure it is accurate. Check the number of pills, and the written instructions to make sure they are clear and accurate. Make sure that you are given enough medication to last until your next medication refill appointment. 5. Taking Medication: Take medication as prescribed. Never take more pills than instructed. Never take medication more frequently than prescribed. Taking less pills or less frequently is permitted and encouraged, when it comes to controlled substances (written prescriptions).  6. Inform other Doctors: Always inform, all of your healthcare providers, of all the medications you take. 7. Pain Medication from other Providers: You are not allowed to accept any additional pain medication from any other Doctor or Healthcare provider. There are two exceptions to this rule. (see below) In the event that you require additional pain medication, you are responsible for notifying us, as stated below. 8. Medication Agreement: You are responsible for carefully  reading and following our Medication Agreement. This must be signed before receiving any prescriptions from our practice. Safely store a copy of your signed Agreement. Violations to the Agreement will result in no further prescriptions. (Additional copies of our Medication Agreement are available upon request.) 9. Laws, Rules, & Regulations: All patients are expected to follow all Federal and [employer], [employer], Rules, [employer]. Ignorance of the Laws does not constitute a valid excuse. The use of any illegal substances is prohibited. 10. Adopted CDC guidelines & recommendations: Target dosing levels will be at or below 60 MME/day. Use of benzodiazepines** is not recommended.  Exceptions: There are only two exceptions to the rule of not receiving pain medications from other Healthcare Providers. 1. Exception #1 (Emergencies): In the event of an emergency (i.e.: accident requiring emergency care), you are allowed to receive additional pain medication. However, you are responsible for: As soon as you are able, call our office [phone], at any time of the day or night, and leave a message stating your name, the date and nature of the emergency, and the name and dose of the medication prescribed. In the event that your call is answered by a member of our staff, make sure to document and save the  date, time, and the name of the person that took your information.  2. Exception #2 (Planned Surgery): In the event that you are scheduled by another doctor or dentist to have any type of surgery or procedure, you are allowed (for a period no longer than 30 days), to receive additional pain medication, for the acute post-op pain. However, in this case, you are responsible for picking up a copy of our "Post-op Pain Management for Surgeons" handout, and giving it to your surgeon or dentist. This document is available at our office, and does not require an appointment to obtain it. Simply go to our office during  business hours (Monday-Thursday from 8:00 AM to 4:00 PM) (Friday 8:00 AM to 12:00 Noon) or if you have a scheduled appointment with [place], prior to your surgery, and ask for it by name. In addition, you will need to provide [place] with your name, name of your surgeon, type of surgery, and date of procedure or surgery.  *Opioid medications include: morphine, codeine, oxycodone, oxymorphone, hydrocodone, hydromorphone, meperidine, tramadol, tapentadol, buprenorphine, fentanyl, methadone. **Benzodiazepine medications include: diazepam (Valium), alprazolam (Xanax), clonazepam (Klonopine), lorazepam (Ativan), clorazepate (Tranxene), chlordiazepoxide (Librium), estazolam (Prosom), oxazepam (Serax), temazepam (Restoril), triazolam (Halcion) (Last updated: 09/21/2017) ____________________________________________________________________________________________

## 2018-01-23 NOTE — Patient Instructions (Addendum)
______You have been gicven a script for Hydrocodone.  You  Have Flexeril sent in to your pharmacy.  You have been given a Toradol, Norflex injection today. ______________________________________________________________________________________  Medication Rules  Applies to: All patients receiving prescriptions (written or electronic).  Pharmacy of record: Pharmacy where electronic prescriptions will be sent. If written prescriptions are taken to a different pharmacy, please inform the nursing staff. The pharmacy listed in the electronic medical record should be the one where you would like electronic prescriptions to be sent.  Prescription refills: Only during scheduled appointments. Applies to both, written and electronic prescriptions.  NOTE: The following applies primarily to controlled substances (Opioid* Pain Medications).   Patient's responsibilities: 1. Pain Pills: Bring all pain pills to every appointment (except for procedure appointments). 2. Pill Bottles: Bring pills in original pharmacy bottle. Always bring newest bottle. Bring bottle, even if empty. 3. Medication refills: You are responsible for knowing and keeping track of what medications you need refilled. The day before your appointment, write a list of all prescriptions that need to be refilled. Bring that list to your appointment and give it to the admitting nurse. Prescriptions will be written only during appointments. If you forget a medication, it will not be "Called in", "Faxed", or "electronically sent". You will need to get another appointment to get these prescribed. 4. Prescription Accuracy: You are responsible for carefully inspecting your prescriptions before leaving our office. Have the discharge nurse carefully go over each prescription with you, before taking them home. Make sure that your name is accurately spelled, that your address is correct. Check the name and dose of your medication to make sure it is accurate.  Check the number of pills, and the written instructions to make sure they are clear and accurate. Make sure that you are given enough medication to last until your next medication refill appointment. 5. Taking Medication: Take medication as prescribed. Never take more pills than instructed. Never take medication more frequently than prescribed. Taking less pills or less frequently is permitted and encouraged, when it comes to controlled substances (written prescriptions).  6. Inform other Doctors: Always inform, all of your healthcare providers, of all the medications you take. 7. Pain Medication from other Providers: You are not allowed to accept any additional pain medication from any other Doctor or Healthcare provider. There are two exceptions to this rule. (see below) In the event that you require additional pain medication, you are responsible for notifying us, as stated below. 8. Medication Agreement: You are responsible for carefully reading and following our Medication Agreement. This must be signed before receiving any prescriptions from our practice. Safely store a copy of your signed Agreement. Violations to the Agreement will result in no further prescriptions. (Additional copies of our Medication Agreement are available upon request.) 9. Laws, Rules, & Regulations: All patients are expected to follow all Federal and [employer], [employer], Rules, [employer]. Ignorance of the Laws does not constitute a valid excuse. The use of any illegal substances is prohibited. 10. Adopted CDC guidelines & recommendations: Target dosing levels will be at or below 60 MME/day. Use of benzodiazepines** is not recommended.  Exceptions: There are only two exceptions to the rule of not receiving pain medications from other Healthcare Providers. 1. Exception #1 (Emergencies): In the event of an emergency (i.e.: accident requiring emergency care), you are allowed to receive additional pain medication. However, you  are responsible for: As soon as you are able, call our office [phone], at any  time of the day or night, and leave a message stating your name, the date and nature of the emergency, and the name and dose of the medication prescribed. In the event that your call is answered by a member of our staff, make sure to document and save the date, time, and the name of the person that took your information.  2. Exception #2 (Planned Surgery): In the event that you are scheduled by another doctor or dentist to have any type of surgery or procedure, you are allowed (for a period no longer than 30 days), to receive additional pain medication, for the acute post-op pain. However, in this case, you are responsible for picking up a copy of our "Post-op Pain Management for Surgeons" handout, and giving it to your surgeon or dentist. This document is available at our office, and does not require an appointment to obtain it. Simply go to our office during business hours (Monday-Thursday from 8:00 AM to 4:00 PM) (Friday 8:00 AM to 12:00 Noon) or if you have a scheduled appointment with [place], prior to your surgery, and ask for it by name. In addition, you will need to provide [place] with your name, name of your surgeon, type of surgery, and date of procedure or surgery.  *Opioid medications include: morphine, codeine, oxycodone, oxymorphone, hydrocodone, hydromorphone, meperidine, tramadol, tapentadol, buprenorphine, fentanyl, methadone. **Benzodiazepine medications include: diazepam (Valium), alprazolam (Xanax), clonazepam (Klonopine), lorazepam (Ativan), clorazepate (Tranxene), chlordiazepoxide (Librium), estazolam (Prosom), oxazepam (Serax), temazepam (Restoril), triazolam (Halcion) (Last updated: 09/21/2017) ____________________________________________________________________________________________

## 2018-01-23 NOTE — Progress Notes (Signed)
Nursing Pain Medication Assessment:  Safety precautions to be maintained throughout the outpatient stay will include: orient to surroundings, keep bed in low position, maintain call bell within reach at all times, provide assistance with transfer out of bed and ambulation.  Medication Inspection Compliance: Pill count conducted under aseptic conditions, in front of the patient. Neither the pills nor the bottle was removed from the patient's sight at any time. Once count was completed pills were immediately returned to the patient in their original bottle.  Medication: Oxycodone/APAP Pill/Patch Count: 26 of 120 pills remain Pill/Patch Appearance: Markings consistent with prescribed medication Bottle Appearance: Standard pharmacy container. Clearly labeled. Filled Date: 6 / 8 / 2019 Last Medication intake:  Today

## 2018-02-01 ENCOUNTER — Encounter: Payer: Self-pay | Admitting: Pain Medicine

## 2018-02-01 ENCOUNTER — Ambulatory Visit: Payer: Medicaid Other | Attending: Pain Medicine | Admitting: Pain Medicine

## 2018-02-01 ENCOUNTER — Other Ambulatory Visit: Payer: Self-pay

## 2018-02-01 VITALS — BP 116/65 | HR 55 | Temp 98.5°F | Resp 16 | Ht 75.0 in | Wt 215.0 lb

## 2018-02-01 DIAGNOSIS — Z955 Presence of coronary angioplasty implant and graft: Secondary | ICD-10-CM | POA: Diagnosis not present

## 2018-02-01 DIAGNOSIS — G8929 Other chronic pain: Secondary | ICD-10-CM | POA: Insufficient documentation

## 2018-02-01 DIAGNOSIS — M25562 Pain in left knee: Secondary | ICD-10-CM

## 2018-02-01 DIAGNOSIS — M25561 Pain in right knee: Secondary | ICD-10-CM

## 2018-02-01 DIAGNOSIS — Z886 Allergy status to analgesic agent status: Secondary | ICD-10-CM | POA: Diagnosis not present

## 2018-02-01 DIAGNOSIS — Z9889 Other specified postprocedural states: Secondary | ICD-10-CM | POA: Insufficient documentation

## 2018-02-01 DIAGNOSIS — M1712 Unilateral primary osteoarthritis, left knee: Secondary | ICD-10-CM | POA: Insufficient documentation

## 2018-02-01 MED ORDER — ROPIVACAINE HCL 2 MG/ML IJ SOLN
4.0000 mL | Freq: Once | INTRAMUSCULAR | Status: AC
  Administered 2018-02-01: 10 mL via INTRA_ARTICULAR

## 2018-02-01 MED ORDER — LIDOCAINE HCL (PF) 1 % IJ SOLN
INTRAMUSCULAR | Status: AC
  Filled 2018-02-01: qty 5

## 2018-02-01 MED ORDER — SODIUM HYALURONATE (VISCOSUP) 20 MG/2ML IX SOSY
2.0000 mL | PREFILLED_SYRINGE | Freq: Once | INTRA_ARTICULAR | Status: AC
  Administered 2018-02-01: 2 mL via INTRA_ARTICULAR

## 2018-02-01 MED ORDER — LIDOCAINE HCL (PF) 1 % IJ SOLN
4.0000 mL | Freq: Once | INTRAMUSCULAR | Status: AC
  Administered 2018-02-01: 5 mL

## 2018-02-01 MED ORDER — ROPIVACAINE HCL 2 MG/ML IJ SOLN
INTRAMUSCULAR | Status: AC
  Filled 2018-02-01: qty 10

## 2018-02-01 NOTE — Progress Notes (Signed)
Safety precautions to be maintained throughout the outpatient stay will include: orient to surroundings, keep bed in low position, maintain call bell within reach at all times, provide assistance with transfer out of bed and ambulation.  

## 2018-02-01 NOTE — Patient Instructions (Addendum)
____________________________________________________________________________________________  Post-Procedure Discharge Instructions  Instructions:  Apply ice: Fill a plastic sandwich bag with crushed ice. Cover it with a small towel and apply to injection site. Apply for 15 minutes then remove x 15 minutes. Repeat sequence on day of procedure, until you go to bed. The purpose is to minimize swelling and discomfort after procedure.  Apply heat: Apply heat to procedure site starting the day following the procedure. The purpose is to treat any soreness and discomfort from the procedure.  Food intake: Start with clear liquids (like water) and advance to regular food, as tolerated.   Physical activities: Keep activities to a minimum for the first 8 hours after the procedure.   Driving: If you have received any sedation, you are not allowed to drive for 24 hours after your procedure.  Blood thinner: Restart your blood thinner 6 hours after your procedure. (Only for those taking blood thinners)  Insulin: As soon as you can eat, you may resume your normal dosing schedule. (Only for those taking insulin)  Infection prevention: Keep procedure site clean and dry.  Post-procedure Pain Diary: Extremely important that this be done correctly and accurately. Recorded information will be used to determine the next step in treatment.  Pain evaluated is that of treated area only. Do not include pain from an untreated area.  Complete every hour, on the hour, for the initial 8 hours. Set an alarm to help you do this part accurately.  Do not go to sleep and have it completed later. It will not be accurate.  Follow-up appointment: Keep your follow-up appointment after the procedure. Usually 2 weeks for most procedures. (6 weeks in the case of radiofrequency.) Bring you pain diary.   Expect:  From numbing medicine (AKA: Local Anesthetics): Numbness or decrease in pain.  Onset: Full effect within 15  minutes of injected.  Duration: It will depend on the type of local anesthetic used. On the average, 1 to 8 hours.   From steroids: Decrease in swelling or inflammation. Once inflammation is improved, relief of the pain will follow.  Onset of benefits: Depends on the amount of swelling present. The more swelling, the longer it will take for the benefits to be seen. In some cases, up to 10 days.  Duration: Steroids will stay in the system x 2 weeks. Duration of benefits will depend on multiple posibilities including persistent irritating factors.  From procedure: Some discomfort is to be expected once the numbing medicine wears off. This should be minimal if ice and heat are applied as instructed.  Call if:  You experience numbness and weakness that gets worse with time, as opposed to wearing off.  New onset bowel or bladder incontinence. (This applies to Spinal procedures only)  Emergency Numbers:  Durning business hours (Monday - Thursday, 8:00 AM - 4:00 PM) (Friday, 9:00 AM - 12:00 Noon): [phone]  After hours: [phone] ____________________________________________________________________________________________   ____________________________________________________________________________________________  Preparing for your procedure (without sedation)  Instructions: . Oral Intake: Do not eat or drink anything for at least 3 hours prior to your procedure. . Transportation: Unless otherwise stated by your physician, you may drive yourself after the procedure. . Blood Pressure Medicine: Take your blood pressure medicine with a sip of water the morning of the procedure. . Blood thinners:  . Diabetics on insulin: Notify the staff so that you can be scheduled 1st case in the morning. If your diabetes requires high dose insulin, take only  of your normal insulin dose   the morning of the procedure and notify the staff that you have done so. . Preventing infections: Shower  with an antibacterial soap the morning of your procedure.  . Build-up your immune system: Take 1000 mg of Vitamin C with every meal (3 times a day) the day prior to your procedure. . Antibiotics: Inform the staff if you have a condition or reason that requires you to take antibiotics before dental procedures. . Pregnancy: If you are pregnant, call and cancel the procedure. . Sickness: If you have a cold, fever, or any active infections, call and cancel the procedure. . Arrival: You must be in the facility at least 30 minutes prior to your scheduled procedure. . Children: Do not bring any children with you. . Dress appropriately: Bring dark clothing that you would not mind if they get stained. . Valuables: Do not bring any jewelry or valuables.  Procedure appointments are reserved for interventional treatments only. . No Prescription Refills. . No medication changes will be discussed during procedure appointments. . No disability issues will be discussed.  Remember:  Regular Business hours are:  Monday to Thursday 8:00 AM to 4:00 PM  Provider's Schedule: Lilyonna Steidle, MD:  Procedure days: Tuesday and Thursday 7:30 AM to 4:00 PM  Bilal Lateef, MD:  Procedure days: Monday and Wednesday 7:30 AM to 4:00 PM ____________________________________________________________________________________________   Post-procedure Information What to expect: Most procedures involve the use of a local anesthetic (numbing medicine), and a steroid (anti-inflammatory medicine).  The local anesthetics may cause temporary numbness and weakness of the legs or arms, depending on the location of the block. This numbness/weakness may last 4-6 hours, depending on the local anesthetic used. In rare instances, it can last up to 24 hours. While numb, you must be very careful not to injure the extremity.  After any procedure, you could expect the pain to get better within 15-20 minutes. This relief is temporary  and may last 4-6 hours. Once the local anesthetics wears off, you could experience discomfort, possibly more than usual, for up to 10 (ten) days. In the case of radiofrequencies, it may last up to 6 weeks. Surgeries may take up to 8 weeks for the healing process. The discomfort is due to the irritation caused by needles going through skin and muscle. To minimize the discomfort, we recommend using ice the first day, and heat from then on. The ice should be applied for 15 minutes on, and 15 minutes off. Keep repeating this cycle until bedtime. Avoid applying the ice directly to the skin, to prevent frostbite. Heat should be used daily, until the pain improves (4-10 days). Be careful not to burn yourself.  Occasionally you may experience muscle spasms or cramps. These occur as a consequence of the irritation caused by the needle sticks to the muscle and the blood that will inevitably be lost into the surrounding muscle tissue. Blood tends to be very irritating to tissues, which tend to react by going into spasm. These spasms may start the same day of your procedure, but they may also take days to develop. This late onset type of spasm or cramp is usually caused by electrolyte imbalances triggered by the steroids, at the level of the kidney. Cramps and spasms tend to respond well to muscle relaxants, multivitamins (some are triggered by the procedure, but may have their origins in vitamin deficiencies), and "Gatorade", or any sports drinks that can replenish any electrolyte imbalances. (If you are a diabetic, ask your pharmacist to get you   a sugar-free brand.) Warm showers or baths may also be helpful. Stretching exercises are highly recommended. General Instructions:  Be alert for signs of possible infection: redness, swelling, heat, red streaks, elevated temperature, and/or fever. These typically appear 4 to 6 days after the procedure. Immediately notify your doctor if you experience unusual bleeding, difficulty  breathing, or loss of bowel or bladder control. If you experience increased pain, do not increase your pain medicine intake, unless instructed by your pain physician. Post-Procedure Care:  Be careful in moving about. Muscle spasms in the area of the injection may occur. Applying ice or heat to the area is often helpful. The incidence of spinal headaches after epidural injections ranges between 1.4% and 6%. If you develop a headache that does not seem to respond to conservative therapy, please let your physician know. This can be treated with an epidural blood patch.   Post-procedure numbness or redness is to be expected, however it should average 4 to 6 hours. If numbness and weakness of your extremities begins to develop 4 to 6 hours after your procedure, and is felt to be progressing and worsening, immediately contact your physician.   Diet:  If you experience nausea, do not eat until this sensation goes away. If you had a "Stellate Ganglion Block" for upper extremity "Reflex Sympathetic Dystrophy", do not eat or drink until your hoarseness goes away. In any case, always start with liquids first and if you tolerate them well, then slowly progress to more solid foods. Activity:  For the first 4 to 6 hours after the procedure, use caution in moving about as you may experience numbness and/or weakness. Use caution in cooking, using household electrical appliances, and climbing steps. If you need to reach your Doctor call our office: [phone] Monday-Thursday 8:00 am - 4:00 PM    Fridays: Closed     In case of an emergency: In case of emergency, call 911 or go to the nearest emergency room and have the physician there call us.  Interpretation of Procedure Every nerve block has two components: a diagnostic component, and a treatment component. Unrealistic expectations are the most common causes of "perceived failure".  In a perfect world, a single nerve block should be able to completely and  permanently eliminate the pain. Sadly, the world is not perfect.  Most pain management nerve blocks are performed using local anesthetics and steroids. Steroids are responsible for any long-term benefit that you may experience. Their purpose is to decrease any chronic swelling that may exist in the area. Steroids begin to work immediately after being injected. However, most patients will not experience any benefits until 5 to 10 days after the injection, when the swelling has come down to the point where they can tell a difference. Steroids will only help if there is swelling to be treated. As such, they can assist with the diagnosis. If effective, they suggest an inflammatory component to the pain, and if ineffective, they rule out inflammation as the main cause or component of the problem. If the problem is one of mechanical compression, you will get no benefit from those steroids.   In the case of local anesthetics, they have a crucial role in the diagnosis of your condition. Most will begin to work within15 to 20 minutes after injection. The duration will depend on the type used (short- vs. Long-acting). It is of outmost importance that patients keep tract of their pain, after the procedure. To assist with this matter, a "Post-procedure Pain   Diary" is provided. Make sure to complete it and to bring it back to your follow-up appointment.  As long as the patient keeps accurate, detailed records of their symptoms after every procedure, and returns to have those interpreted, every procedure will provide us with invaluable information. Even a block that does not provide the patient with any relief, will always provide us with information about the mechanism and the origin of the pain. The only time a nerve block can be considered a waste of time is when patients do not keep track of the results, or do not keep their post-procedure appointment.  Reporting the results back to your physician The Pain  Score  Pain is a subjective complaint. It cannot be seen, touched, or measured. We depend entirely on the patient's report of the pain in order to assess your condition and treatment. To evaluate the pain, we use a pain scale, where "0" means "No Pain", and a "10" is "the worst possible pain that you can even imagine" (i.e. something like been eaten alive by a shark or being torn apart by a lion).   You will frequently be asked to rate your pain. Please be as accurate, remember that medical decisions will be based on your responses. Please do not rate your pain above a 10. Doing so is actually interpreted as "symptom magnification" (exaggeration), as well as lack of understanding with regards to the scale. To put this into perspective, when you tell us that your pain is at a 10 (ten), what you are saying is that there is nothing we can do to make this pain any worse. (Carefully think about that.) 

## 2018-02-01 NOTE — Progress Notes (Signed)
Patient's Name: Keith Gibbs  MRN: [id number]  Referring Provider: Jodi Marble, MD  DOB: Apr 17, 1961  PCP: Jodi Marble, MD  DOS: 02/01/2018  Note by: Gaspar Cola, MD  Service setting: Ambulatory outpatient  Specialty: Interventional Pain Management  Patient type: Established  Location: [facility] (AMB) Pain Management Facility  Visit type: Interventional Procedure   Primary Reason for Visit: Interventional Pain Management Treatment. CC: Knee Pain (left)  Procedure:          Anesthesia, Analgesia, Anxiolysis:  Type: Therapeutic Intra-Articular Hyalgan Knee Injection #4  Region: Lateral infrapatellar Knee Region Level: Knee Joint Laterality: Left knee  Type: Local Anesthesia Indication(s): Analgesia         Local Anesthetic: Lidocaine 1-2% Route: Infiltration (Esparto/IM) IV Access: Declined Sedation: Declined    Indications: 1. Osteoarthritis of the knee (Left)   2. Chronic knee pain (Primary Area of Pain)  (Bilateral) (R>L)    Pain Score: Pre-procedure: 1 /10 Post-procedure: 0-No pain/10  Pre-op Assessment:  Mr. Dirico is a 57 y.o. (year old), male patient, seen today for interventional treatment. He  has a past surgical history that includes Appendectomy; Shoulder surgery (Right, 04/09/2012); Back surgery; Abdominal surgery; Colonoscopy; Cardiac catheterization (Left, 06/10/2016); Cardiac catheterization (N/A, 06/10/2016); Esophagogastroduodenoscopy (egd) with propofol (N/A, 01/05/2017); Colonoscopy with propofol (N/A, 01/05/2017); Coronary angioplasty with stent; and Knee arthroscopy with medial menisectomy (Right, 09/05/2017). Mr. Heckler has a current medication list which includes the following prescription(s): acetaminophen, albuterol, aspirin ec, atorvastatin, bupropion, celecoxib, cetirizine, cyclobenzaprine, divalproex, dulera, hydrocodone-acetaminophen, isosorbide mononitrate, melatonin, meloxicam, nitroglycerin, omeprazole, prazosin, ranolazine, ropinirole, tamsulosin,  and ticagrelor. His primarily concern today is the Knee Pain (left)  Initial Vital Signs:  Pulse/HCG Rate: (!) 55ECG Heart Rate: (!) 52 Temp: 98.5 F (36.9 C) Resp: 16 BP: 123/66 SpO2: 97 %  BMI: Estimated body mass index is 26.87 kg/m as calculated from the following:   Height as of this encounter: 6\' 3"  (1.905 m).   Weight as of this encounter: 215 lb (97.5 kg).  Risk Assessment: Allergies: Reviewed. He is allergic to latuda [lurasidone hcl]; naproxen; and saphris [asenapine].  Allergy Precautions: None required Coagulopathies: Reviewed. None identified.  Blood-thinner therapy: None at this time Active Infection(s): Reviewed. None identified. Mr. Carithers is afebrile  Site Confirmation: Mr. Salva was asked to confirm the procedure and laterality before marking the site Procedure checklist: Completed Consent: Before the procedure and under the influence of no sedative(s), amnesic(s), or anxiolytics, the patient was informed of the treatment options, risks and possible complications. To fulfill our ethical and legal obligations, as recommended by the American Medical Association's Code of Ethics, I have informed the patient of my clinical impression; the nature and purpose of the treatment or procedure; the risks, benefits, and possible complications of the intervention; the alternatives, including doing nothing; the risk(s) and benefit(s) of the alternative treatment(s) or procedure(s); and the risk(s) and benefit(s) of doing nothing. The patient was provided information about the general risks and possible complications associated with the procedure. These may include, but are not limited to: failure to achieve desired goals, infection, bleeding, organ or nerve damage, allergic reactions, paralysis, and death. In addition, the patient was informed of those risks and complications associated to the procedure, such as failure to decrease pain; infection; bleeding; organ or nerve damage with  subsequent damage to sensory, motor, and/or autonomic systems, resulting in permanent pain, numbness, and/or weakness of one or several areas of the body; allergic reactions; (i.e.: anaphylactic reaction); and/or death. Furthermore, the patient  was informed of those risks and complications associated with the medications. These include, but are not limited to: allergic reactions (i.e.: anaphylactic or anaphylactoid reaction(s)); adrenal axis suppression; blood sugar elevation that in diabetics may result in ketoacidosis or comma; water retention that in patients with history of congestive heart failure may result in shortness of breath, pulmonary edema, and decompensation with resultant heart failure; weight gain; swelling or edema; medication-induced neural toxicity; particulate matter embolism and blood vessel occlusion with resultant organ, and/or nervous system infarction; and/or aseptic necrosis of one or more joints. Finally, the patient was informed that Medicine is not an exact science; therefore, there is also the possibility of unforeseen or unpredictable risks and/or possible complications that may result in a catastrophic outcome. The patient indicated having understood very clearly. We have given the patient no guarantees and we have made no promises. Enough time was given to the patient to ask questions, all of which were answered to the patient's satisfaction. Mr. Hogg has indicated that he wanted to continue with the procedure. Attestation: I, the ordering provider, attest that I have discussed with the patient the benefits, risks, side-effects, alternatives, likelihood of achieving goals, and potential problems during recovery for the procedure that I have provided informed consent. Date  Time: 02/01/2018 10:12 AM  Pre-Procedure Preparation:  Monitoring: As per clinic protocol. Respiration, ETCO2, SpO2, BP, heart rate and rhythm monitor placed and checked for adequate function Safety  Precautions: Patient was assessed for positional comfort and pressure points before starting the procedure. Time-out: I initiated and conducted the "Time-out" before starting the procedure, as per protocol. The patient was asked to participate by confirming the accuracy of the "Time Out" information. Verification of the correct person, site, and procedure were performed and confirmed by me, the nursing staff, and the patient. "Time-out" conducted as per Joint Commission's Universal Protocol (UP.01.01.01). Time: 1023  Description of Procedure:          Position: Sitting Target Area: Knee Joint Approach: Just above the Lateral tibial plateau, lateral to the infrapatellar tendon. Area Prepped: Entire knee area, from the mid-thigh to the mid-shin. Prepping solution: ChloraPrep (2% chlorhexidine gluconate and 70% isopropyl alcohol) Safety Precautions: Aspiration looking for blood return was conducted prior to all injections. At no point did we inject any substances, as a needle was being advanced. No attempts were made at seeking any paresthesias. Safe injection practices and needle disposal techniques used. Medications properly checked for expiration dates. SDV (single dose vial) medications used. Description of the Procedure: Protocol guidelines were followed. The patient was placed in position over the fluoroscopy table. The target area was identified and the area prepped in the usual manner. Skin & deeper tissues infiltrated with local anesthetic. Appropriate amount of time allowed to pass for local anesthetics to take effect. The procedure needles were then advanced to the target area. Proper needle placement secured. Negative aspiration confirmed. Solution injected in intermittent fashion, asking for systemic symptoms every 0.5cc of injectate. The needles were then removed and the area cleansed, making sure to leave some of the prepping solution back to take advantage of its long term bactericidal  properties. Vitals:   02/01/18 1010 02/01/18 1027  BP: 123/66 116/65  Pulse: (!) 55   Resp: 16 16  Temp: 98.5 F (36.9 C)   TempSrc: Oral   SpO2: 97% 99%  Weight: 215 lb (97.5 kg)   Height: 6\' 3"  (1.905 m)     Start Time: 1023 hrs. End Time: 1025 hrs. Materials:  Needle(s) Type: Regular needle Gauge: 25G Length: 1.5-in Medication(s): Please see orders for medications and dosing details.  Imaging Guidance:          Type of Imaging Technique: None used Indication(s): N/A Exposure Time: No patient exposure Contrast: None used. Fluoroscopic Guidance: N/A Ultrasound Guidance: N/A Interpretation: N/A  Antibiotic Prophylaxis:   Anti-infectives (From admission, onward)   None     Indication(s): None identified  Post-operative Assessment:  Post-procedure Vital Signs:  Pulse/HCG Rate: (!) 55(!) 52 Temp: 98.5 F (36.9 C) Resp: 16 BP: 116/65 SpO2: 99 %  EBL: None  Complications: No immediate post-treatment complications observed by team, or reported by patient.  Note: The patient tolerated the entire procedure well. A repeat set of vitals were taken after the procedure and the patient was kept under observation following institutional policy, for this type of procedure. Post-procedural neurological assessment was performed, showing return to baseline, prior to discharge. The patient was provided with post-procedure discharge instructions, including a section on how to identify potential problems. Should any problems arise concerning this procedure, the patient was given instructions to immediately contact us, at any time, without hesitation. In any case, we plan to contact the patient by telephone for a follow-up status report regarding this interventional procedure.  Comments:  No additional relevant information.  Plan of Care   Imaging Orders  No imaging studies ordered today    Procedure Orders     KNEE INJECTION     KNEE INJECTION  Medications ordered for  procedure: Meds ordered this encounter  Medications  . lidocaine (PF) (XYLOCAINE) 1 % injection 4 mL  . ropivacaine (PF) 2 mg/mL (0.2%) (NAROPIN) injection 4 mL  . Sodium Hyaluronate SOSY 2 mL   Medications administered: We administered lidocaine (PF), ropivacaine (PF) 2 mg/mL (0.2%), and Sodium Hyaluronate.  See the medical record for exact dosing, route, and time of administration.  New Prescriptions   No medications on file   Disposition: Discharge home  Discharge Date & Time: 02/01/2018; 1027 hrs.   Physician-requested Follow-up: Return for PPE (2 wks) + Procedure (no sedation): (L) Hyalgan #5.  Future Appointments  Date Time Provider [place]  02/20/2018 11:15 AM Vevelyn Francois, NP [facility] None  04/24/2018  9:00 AM Bacigalupo, Dionne Bucy, MD [facility] None   Primary Care Physician: Jodi Marble, MD Location: [facility] Note by: Gaspar Cola, MD Date: 02/01/2018; Time: 10:27 AM  Disclaimer:  Medicine is not an exact science. The only guarantee in medicine is that nothing is guaranteed. It is important to note that the decision to proceed with this intervention was based on the information collected from the patient. The Data and conclusions were drawn from the patient's questionnaire, the interview, and the physical examination. Because the information was provided in large part by the patient, it cannot be guaranteed that it has not been purposely or unconsciously manipulated. Every effort has been made to obtain as much relevant data as possible for this evaluation. It is important to note that the conclusions that lead to this procedure are derived in large part from the available data. Always take into account that the treatment will also be dependent on availability of resources and existing treatment guidelines, considered by other Pain Management Practitioners as being common knowledge and practice, at the time of the  intervention. For Medico-Legal purposes, it is also important to point out that variation in procedural techniques and pharmacological choices are the acceptable norm. The indications, contraindications,  technique, and results of the above procedure should only be interpreted and judged by a Board-Certified Interventional Pain Specialist with extensive familiarity and expertise in the same exact procedure and technique.

## 2018-02-02 ENCOUNTER — Telehealth: Payer: Self-pay

## 2018-02-02 NOTE — Telephone Encounter (Signed)
Post procedure phone call.   No answer.  

## 2018-02-15 ENCOUNTER — Other Ambulatory Visit: Payer: Self-pay

## 2018-02-15 ENCOUNTER — Encounter: Payer: Self-pay | Admitting: Pain Medicine

## 2018-02-15 ENCOUNTER — Ambulatory Visit: Payer: Medicaid Other | Attending: Pain Medicine | Admitting: Pain Medicine

## 2018-02-15 VITALS — BP 120/73 | HR 53 | Temp 98.3°F | Resp 14 | Ht 75.0 in | Wt 220.0 lb

## 2018-02-15 DIAGNOSIS — G8929 Other chronic pain: Secondary | ICD-10-CM

## 2018-02-15 DIAGNOSIS — Z955 Presence of coronary angioplasty implant and graft: Secondary | ICD-10-CM | POA: Insufficient documentation

## 2018-02-15 DIAGNOSIS — M25562 Pain in left knee: Secondary | ICD-10-CM | POA: Insufficient documentation

## 2018-02-15 DIAGNOSIS — M25561 Pain in right knee: Secondary | ICD-10-CM | POA: Diagnosis not present

## 2018-02-15 DIAGNOSIS — M549 Dorsalgia, unspecified: Secondary | ICD-10-CM | POA: Insufficient documentation

## 2018-02-15 DIAGNOSIS — M1712 Unilateral primary osteoarthritis, left knee: Secondary | ICD-10-CM

## 2018-02-15 MED ORDER — SODIUM HYALURONATE (VISCOSUP) 20 MG/2ML IX SOSY
2.0000 mL | PREFILLED_SYRINGE | Freq: Once | INTRA_ARTICULAR | Status: AC
  Administered 2018-02-15: 11:00:00 via INTRA_ARTICULAR

## 2018-02-15 MED ORDER — ROPIVACAINE HCL 2 MG/ML IJ SOLN
4.0000 mL | Freq: Once | INTRAMUSCULAR | Status: AC
  Administered 2018-02-15: 10 mL via INTRA_ARTICULAR
  Filled 2018-02-15: qty 10

## 2018-02-15 MED ORDER — LIDOCAINE HCL (PF) 1 % IJ SOLN
4.0000 mL | Freq: Once | INTRAMUSCULAR | Status: AC
  Administered 2018-02-15: 5 mL
  Filled 2018-02-15: qty 5

## 2018-02-15 NOTE — Progress Notes (Signed)
Patient's Name: Keith Gibbs  MRN: [id number]  Referring Provider: Jodi Marble, MD  DOB: 1960/09/28  PCP: Jodi Marble, MD  DOS: 02/15/2018  Note by: Gaspar Cola, MD  Service setting: Ambulatory outpatient  Specialty: Interventional Pain Management  Patient type: Established  Location: [facility] (AMB) Pain Management Facility  Visit type: Interventional Procedure   Primary Reason for Visit: Interventional Pain Management Treatment. CC: Back Pain  Procedure:          Anesthesia, Analgesia, Anxiolysis:  Type: Therapeutic Intra-Articular Hyalgan Knee Injection #5  Region: Lateral infrapatellar Knee Region Level: Knee Joint Laterality: Left knee  Type: Local Anesthesia Indication(s): Analgesia         Local Anesthetic: Lidocaine 1-2% Route: Infiltration ([place]/IM) IV Access: Declined Sedation: Declined    Indications: 1. Osteoarthritis of the knee (Left)   2. Chronic knee pain (Primary Area of Pain)  (Bilateral) (R>L)    Pain Score: Pre-procedure: 4 /10 Post-procedure: 0-No pain/10  Pre-op Assessment:  Keith Gibbs is a 57 y.o. (year old), male patient, seen today for interventional treatment. He  has a past surgical history that includes Appendectomy; Shoulder surgery (Right, 04/09/2012); Back surgery; Abdominal surgery; Colonoscopy; Cardiac catheterization (Left, 06/10/2016); Cardiac catheterization (N/A, 06/10/2016); Esophagogastroduodenoscopy (egd) with propofol (N/A, 01/05/2017); Colonoscopy with propofol (N/A, 01/05/2017); Coronary angioplasty with stent; and Knee arthroscopy with medial menisectomy (Right, 09/05/2017). Keith Gibbs has a current medication list which includes the following prescription(s): acetaminophen, albuterol, aripiprazole, aspirin ec, atorvastatin, bupropion, celecoxib, cetirizine, cyclobenzaprine, divalproex, dulera, hydrocodone-acetaminophen, isosorbide mononitrate, melatonin, meloxicam, nitroglycerin, omeprazole, prazosin, ranolazine, ropinirole,  tamsulosin, and ticagrelor. His primarily concern today is the Back Pain  Initial Vital Signs:  Pulse/HCG Rate: (!) 53ECG Heart Rate: (!) 52 Temp: 98.7 F (37.1 C) Resp: 14 BP: 124/75 SpO2: 100 %  BMI: Estimated body mass index is 27.5 kg/m as calculated from the following:   Height as of this encounter: 6\' 3"  (1.905 m).   Weight as of this encounter: 220 lb (99.8 kg).  Risk Assessment: Allergies: Reviewed. He is allergic to latuda [lurasidone hcl]; naproxen; and saphris [asenapine].  Allergy Precautions: None required Coagulopathies: Reviewed. None identified.  Blood-thinner therapy: None at this time Active Infection(s): Reviewed. None identified. Keith Gibbs is afebrile  Site Confirmation: Keith Gibbs was asked to confirm the procedure and laterality before marking the site Procedure checklist: Completed Consent: Before the procedure and under the influence of no sedative(s), amnesic(s), or anxiolytics, the patient was informed of the treatment options, risks and possible complications. To fulfill our ethical and legal obligations, as recommended by the American Medical Association's Code of Ethics, I have informed the patient of my clinical impression; the nature and purpose of the treatment or procedure; the risks, benefits, and possible complications of the intervention; the alternatives, including doing nothing; the risk(s) and benefit(s) of the alternative treatment(s) or procedure(s); and the risk(s) and benefit(s) of doing nothing. The patient was provided information about the general risks and possible complications associated with the procedure. These may include, but are not limited to: failure to achieve desired goals, infection, bleeding, organ or nerve damage, allergic reactions, paralysis, and death. In addition, the patient was informed of those risks and complications associated to the procedure, such as failure to decrease pain; infection; bleeding; organ or nerve damage  with subsequent damage to sensory, motor, and/or autonomic systems, resulting in permanent pain, numbness, and/or weakness of one or several areas of the body; allergic reactions; (i.e.: anaphylactic reaction); and/or death. Furthermore, the patient was  informed of those risks and complications associated with the medications. These include, but are not limited to: allergic reactions (i.e.: anaphylactic or anaphylactoid reaction(s)); adrenal axis suppression; blood sugar elevation that in diabetics may result in ketoacidosis or comma; water retention that in patients with history of congestive heart failure may result in shortness of breath, pulmonary edema, and decompensation with resultant heart failure; weight gain; swelling or edema; medication-induced neural toxicity; particulate matter embolism and blood vessel occlusion with resultant organ, and/or nervous system infarction; and/or aseptic necrosis of one or more joints. Finally, the patient was informed that Medicine is not an exact science; therefore, there is also the possibility of unforeseen or unpredictable risks and/or possible complications that may result in a catastrophic outcome. The patient indicated having understood very clearly. We have given the patient no guarantees and we have made no promises. Enough time was given to the patient to ask questions, all of which were answered to the patient's satisfaction. Keith Gibbs has indicated that he wanted to continue with the procedure. Attestation: I, the ordering provider, attest that I have discussed with the patient the benefits, risks, side-effects, alternatives, likelihood of achieving goals, and potential problems during recovery for the procedure that I have provided informed consent. Date  Time: 02/15/2018 10:32 AM  Pre-Procedure Preparation:  Monitoring: As per clinic protocol. Respiration, ETCO2, SpO2, BP, heart rate and rhythm monitor placed and checked for adequate function Safety  Precautions: Patient was assessed for positional comfort and pressure points before starting the procedure. Time-out: I initiated and conducted the "Time-out" before starting the procedure, as per protocol. The patient was asked to participate by confirming the accuracy of the "Time Out" information. Verification of the correct person, site, and procedure were performed and confirmed by me, the nursing staff, and the patient. "Time-out" conducted as per Joint Commission's Universal Protocol (UP.01.01.01). Time: 1112  Description of Procedure:          Position: Sitting Target Area: Knee Joint Approach: Just above the Lateral tibial plateau, lateral to the infrapatellar tendon. Area Prepped: Entire knee area, from the mid-thigh to the mid-shin. Prepping solution: ChloraPrep (2% chlorhexidine gluconate and 70% isopropyl alcohol) Safety Precautions: Aspiration looking for blood return was conducted prior to all injections. At no point did we inject any substances, as a needle was being advanced. No attempts were made at seeking any paresthesias. Safe injection practices and needle disposal techniques used. Medications properly checked for expiration dates. SDV (single dose vial) medications used. Description of the Procedure: Protocol guidelines were followed. The patient was placed in position over the fluoroscopy table. The target area was identified and the area prepped in the usual manner. Skin & deeper tissues infiltrated with local anesthetic. Appropriate amount of time allowed to pass for local anesthetics to take effect. The procedure needles were then advanced to the target area. Proper needle placement secured. Negative aspiration confirmed. Solution injected in intermittent fashion, asking for systemic symptoms every 0.5cc of injectate. The needles were then removed and the area cleansed, making sure to leave some of the prepping solution back to take advantage of its long term bactericidal  properties. Vitals:   02/15/18 1028 02/15/18 1114  BP: 124/75 120/73  Pulse: (!) 53   Resp:  14  Temp: 98.7 F (37.1 C) 98.3 F (36.8 C)  SpO2: 100% 98%  Weight: 220 lb (99.8 kg)   Height: 6\' 3"  (1.905 m)     Start Time: 1112 hrs. End Time: 1114 hrs. Materials:  Needle(s)  Type: Regular needle Gauge: 25G Length: 1.5-in Medication(s): Please see orders for medications and dosing details.  Imaging Guidance:          Type of Imaging Technique: None used Indication(s): N/A Exposure Time: No patient exposure Contrast: None used. Fluoroscopic Guidance: N/A Ultrasound Guidance: N/A Interpretation: N/A  Antibiotic Prophylaxis:   Anti-infectives (From admission, onward)   None     Indication(s): None identified  Post-operative Assessment:  Post-procedure Vital Signs:  Pulse/HCG Rate: (!) 53(!) 52 Temp: 98.3 F (36.8 C) Resp: 14 BP: 120/73 SpO2: 98 %  EBL: None  Complications: No immediate post-treatment complications observed by team, or reported by patient.  Note: The patient tolerated the entire procedure well. A repeat set of vitals were taken after the procedure and the patient was kept under observation following institutional policy, for this type of procedure. Post-procedural neurological assessment was performed, showing return to baseline, prior to discharge. The patient was provided with post-procedure discharge instructions, including a section on how to identify potential problems. Should any problems arise concerning this procedure, the patient was given instructions to immediately contact us, at any time, without hesitation. In any case, we plan to contact the patient by telephone for a follow-up status report regarding this interventional procedure.  Comments:  No additional relevant information.  Plan of Care   Interventional management options: Planned, scheduled, and/or pending:   None at this time. Now that knee pain has improved, consider "Drug  Holiday". We'll assess the patient for his low back pain, right shoulder pain, and neck pain for the possibility of interventional therapies as delineated below. Medication management will continue to be provided by Janice Norrie, NP under my supervision.    Considering:   Diagnostic left intra-articular knee injection w/ steroids #2  Palliative left-sided Hyalgan knee injection series #2 (last one completed on 02/15/2018) Diagnostic bilateral genicular nerve block Possiblebilateral genicular nerve RFA Diagnostic lumbar facet nerve block Possible bilateral lumbar facet RFA Diagnostic bilateral hip injections Diagnosticright suprascapular nerve block Possible right suprascapular nerve RFA Diagnosticbilateral cervical facet nerve block Possible bilateral cervical RFA   Palliative PRN treatment(s):   None at this time   Imaging Orders  No imaging studies ordered today    Procedure Orders     KNEE INJECTION  Medications ordered for procedure: Meds ordered this encounter  Medications  . lidocaine (PF) (XYLOCAINE) 1 % injection 4 mL  . ropivacaine (PF) 2 mg/mL (0.2%) (NAROPIN) injection 4 mL  . Sodium Hyaluronate SOSY 2 mL   Medications administered: We administered lidocaine (PF), ropivacaine (PF) 2 mg/mL (0.2%), and Sodium Hyaluronate.  See the medical record for exact dosing, route, and time of administration.  New Prescriptions   No medications on file    Disposition: Discharge home  Discharge Date & Time: 02/15/2018; 1148 hrs.   Physician-requested Follow-up: Return for post-procedure eval (2 wks), w/ Dr. Dossie Arbour.  Future Appointments  Date Time Provider [place]  02/20/2018 11:15 AM Vevelyn Francois, NP [facility] None  03/07/2018  9:15 AM Milinda Pointer, MD [facility] None  04/24/2018  9:00 AM Bacigalupo, Dionne Bucy, MD [facility] None   Primary Care Physician: Jodi Marble, MD Location: [facility] Note by: Gaspar Cola, MD Date: 02/15/2018; Time: 12:44 PM  Disclaimer:  Medicine is not an Chief Strategy Officer. The only guarantee in medicine is that nothing is guaranteed. It is important to note that the decision to proceed with this intervention was based on the information collected from  the patient. The Data and conclusions were drawn from the patient's questionnaire, the interview, and the physical examination. Because the information was provided in large part by the patient, it cannot be guaranteed that it has not been purposely or unconsciously manipulated. Every effort has been made to obtain as much relevant data as possible for this evaluation. It is important to note that the conclusions that lead to this procedure are derived in large part from the available data. Always take into account that the treatment will also be dependent on availability of resources and existing treatment guidelines, considered by other Pain Management Practitioners as being common knowledge and practice, at the time of the intervention. For Medico-Legal purposes, it is also important to point out that variation in procedural techniques and pharmacological choices are the acceptable norm. The indications, contraindications, technique, and results of the above procedure should only be interpreted and judged by a Board-Certified Interventional Pain Specialist with extensive familiarity and expertise in the same exact procedure and technique.

## 2018-02-15 NOTE — Patient Instructions (Signed)

## 2018-02-16 ENCOUNTER — Telehealth: Payer: Self-pay

## 2018-02-16 NOTE — Telephone Encounter (Signed)
Left message to call if needed after procedure.

## 2018-02-20 ENCOUNTER — Ambulatory Visit: Payer: Medicaid Other | Admitting: Nurse Practitioner

## 2018-02-27 ENCOUNTER — Encounter: Payer: Medicaid Other | Admitting: Nurse Practitioner

## 2018-03-01 ENCOUNTER — Encounter: Payer: Self-pay | Admitting: Family Medicine

## 2018-03-05 NOTE — Progress Notes (Deleted)
Patient's Name: Keith Gibbs  MRN: [id number]  Referring Provider: Jodi Marble, MD  DOB: 1960-09-09  PCP: Jodi Marble, MD  DOS: 03/07/2018  Note by: Gaspar Cola, MD  Service setting: Ambulatory outpatient  Specialty: Interventional Gibbs Management  Location: [facility] (AMB) [facility] Facility    Patient type: Established   Keith Reason(s) for Visit: Encounter for post-procedure evaluation of chronic illness with mild to moderate exacerbation CC: No chief complaint on file.  HPI  Keith Gibbs is a 57 y.o. year old, male patient, who comes today for a post-procedure evaluation. He has Schizophrenia (Keith Gibbs); COPD (chronic obstructive pulmonary disease) (Keith Gibbs); Chronic hepatitis C (Keith Gibbs); GERD (gastroesophageal reflux disease); Swollen testicle; Abnormal ejaculation; Chest Gibbs, unspecified; Bipolar 1 disorder (Keith Gibbs); Cocaine abuse in remission (Keith Gibbs); Chest Gibbs; Angina at rest Keith Gibbs); Severe recurrent major depression without psychotic features (Keith Gibbs); Coronary artery disease; Alcohol use disorder, moderate, dependence (Keith Gibbs); Cocaine use disorder, moderate, dependence (Keith Gibbs); Cannabis use disorder, moderate, dependence (Keith Gibbs); Overdose of medication, intentional self-harm, sequela (Keith Gibbs); Tobacco use disorder; Chronic knee Gibbs (Keith Gibbs)  (Bilateral) (R>L); Chronic low back Gibbs (Keith Gibbs) (Bilateral); Chronic hip Gibbs (Keith Gibbs) (Bilateral) (R>L); Chronic shoulder Gibbs (Keith Gibbs) (Right); Spondylosis without myelopathy or radiculopathy, cervical region; Chronic Gibbs syndrome; Long term current use of opiate analgesic; Opiate use; Pharmacologic therapy; Disorder of skeletal system; Problems influencing health status; 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); Chronic neck Gibbs (Keith Gibbs) (Midline); Chronic musculoskeletal Gibbs; 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 His primarily concern today is the No chief complaint on file.  Gibbs Assessment: Location:     Radiating:   Onset:   Duration:   Quality:   Severity:  /10 (subjective, self-reported Gibbs score)  Note: Reported level is compatible with observation.                         When using our objective Gibbs Scale, levels between 6 and 10/10 are said to belong in an emergency room, as it progressively worsens from a 6/10, described as severely limiting, requiring emergency care not usually available at an outpatient Gibbs management facility. At a 6/10 level, communication becomes difficult and requires great effort. Assistance to reach the emergency department may be required. Facial flushing and profuse sweating along with potentially dangerous increases in heart rate and blood pressure will be evident. Effect on ADL:   Timing:   Modifying factors:   BP:    HR:    Keith Gibbs comes in today for post-procedure evaluation after the treatment done on 02/27/2018.  Further details on both, my assessment(s), as well as the proposed treatment plan, please see below.  Post-Procedure Assessment  57/25/2019 Procedure: Therapeutic left Intra-Articular Hyalgan Knee Injection #5  Pre-procedure Gibbs score:  4/10 Post-procedure Gibbs score: 0/10 (100% relief) Influential Factors: BMI:   Intra-procedural challenges: None observed.         Assessment challenges: None detected.              Reported side-effects: None.  Post-procedural adverse reactions or  complications: None reported         Sedation: No sedation used. When no sedatives are used, the analgesic levels obtained are directly associated to the effectiveness of the local anesthetics. However, when sedation is provided, the level of analgesia obtained during the initial 1 hour following the intervention, is believed to be the result of a combination of factors. These factors may include, but are not limited to: 1. The effectiveness of the local anesthetics used. 2. The effects of the analgesic(s) and/or anxiolytic(s) used. 3. The degree of discomfort experienced by the patient at the time of the procedure. 4. The patients ability and reliability in recalling and recording the events. 5. The presence and influence of possible Keith gains and/or psychosocial factors. Reported result: Relief experienced during the 1st hour after the procedure:   (Ultra-Short Term Relief)            Interpretative annotation: Clinically appropriate result. No IV Analgesic or Anxiolytic given, therefore benefits are completely due to Local Anesthetic effects.          Effects of local anesthetic: The analgesic effects attained during this period are directly associated to the localized infiltration of local anesthetics and therefore cary significant diagnostic value as to the etiological location, or anatomical origin, of the Gibbs. Expected duration of relief is directly dependent on the pharmacodynamics of the local anesthetic used. Long-acting (4-6 hours) anesthetics used.  Reported result: Relief during the next 4 to 6 hour after the procedure:   (Short-Term Relief)            Interpretative annotation: Clinically appropriate result. Analgesia during this period is likely to be Local Anesthetic-related.          Long-term benefit: Defined as the period of time past the expected duration of local anesthetics (1 hour for short-acting and 4-6 hours for long-acting). With the possible exception of prolonged  sympathetic blockade from the local anesthetics, benefits during this period are typically attributed to, or associated with, other factors such as analgesic sensory neuropraxia, antiinflammatory effects, or beneficial biochemical changes provided by agents other than the local anesthetics.  Reported result: Extended relief following procedure:   (Long-Term Relief)            Interpretative annotation: Clinically possible results. Good relief. No permanent benefit expected. Inflammation plays a part in the etiology to the Gibbs.          Current benefits: Defined as reported results that persistent at this point in time.   Analgesia: *** %            Function: Somewhat improved ROM: Somewhat improved Interpretative annotation: Recurrence of symptoms. No permanent benefit expected. Effective diagnostic intervention.          Interpretation: Results would suggest a successful diagnostic intervention.                  Plan:  Please see "Plan of Care" for details.                Laboratory Chemistry  Inflammation Markers (CRP: Acute Phase) (ESR: Chronic Phase) Lab Results  Component Value Date   CRP 24.6 (H) 09/11/2017   ESRSEDRATE 16 09/11/2017   LATICACIDVEN 1.2 02/20/2017                         Renal Markers Lab Results  Component Value Date   BUN 11 09/11/2017   CREATININE 0.98  09/11/2017   BCR 11 09/11/2017   GFRAA 99 09/11/2017   GFRNONAA 85 09/11/2017                             Hepatic Markers Lab Results  Component Value Date   AST 15 09/11/2017   ALT 31 02/20/2017   ALBUMIN 4.2 09/11/2017   HCVAB >11.0 (H) 11/01/2016                        Neuropathy Markers Lab Results  Component Value Date   HGBA1C 5.2 12/28/2016                        Hematology Parameters Lab Results  Component Value Date   INR 1.02 11/01/2016   LABPROT 13.4 11/01/2016   PLT 249 08/30/2017   HGB 15.4 08/30/2017   HCT 44.9 08/30/2017                        CV Markers Lab Results   Component Value Date   BNP 12.0 12/26/2016   TROPONINI <0.03 02/20/2017                         Note: Lab results reviewed.  Recent Imaging Results  No results found for this or any previous visit. Interpretation Report: Fluoroscopy was used during the procedure to assist with needle guidance. The images were interpreted intraoperatively by the requesting physician.  Meds   Current Outpatient Medications:  .  acetaminophen (TYLENOL) 500 MG tablet, Take 2 tablets (1,000 mg total) by mouth every 6 (six) hours as needed. For Gibbs., Disp: 90 tablet, Rfl: 1 .  albuterol (PROAIR HFA) 108 (90 Base) MCG/ACT inhaler, Inhale 2 puffs into the lungs every 6 (six) hours as needed for wheezing or shortness of breath., Disp: 1 Inhaler, Rfl: 1 .  ARIPiprazole (ABILIFY) 20 MG tablet, Take 20 mg by mouth daily., Disp: , Rfl:  .  aspirin EC 81 MG tablet, Take 1 tablet (81 mg total) by mouth daily., Disp: 30 tablet, Rfl: 1 .  atorvastatin (LIPITOR) 40 MG tablet, Take 1 tablet (40 mg total) by mouth daily at 6 PM., Disp: 30 tablet, Rfl: 1 .  buPROPion (WELLBUTRIN SR) 100 MG 12 hr tablet, Take 100 mg by mouth 2 (two) times daily., Disp: , Rfl:  .  celecoxib (CELEBREX) 200 MG capsule, Take 200 mg by mouth 2 (two) times daily., Disp: , Rfl:  .  cetirizine (ZYRTEC) 10 MG tablet, Take 10 mg by mouth daily., Disp: , Rfl:  .  cyclobenzaprine (FLEXERIL) 10 MG tablet, Take 1 tablet (10 mg total) by mouth 3 (three) times daily as needed for muscle spasms., Disp: 90 tablet, Rfl: 0 .  divalproex (DEPAKOTE) 500 MG DR tablet, Take 500 mg by mouth 2 (two) times daily., Disp: , Rfl: 0 .  DULERA 200-5 MCG/ACT AERO, TAKE 2 PUFFS BY MOUTH TWICE A DAY, Disp: 1 Inhaler, Rfl: 1 .  HYDROcodone-acetaminophen (NORCO) 5-325 MG tablet, Take 1 tablet by mouth every 6 (six) hours as needed for severe Gibbs. Max.: 4/day. Must Last 30 days., Disp: 120 tablet, Rfl: 0 .  isosorbide mononitrate (IMDUR) 30 MG 24 hr tablet, Take 1 tablet (30 mg  total) by mouth daily. (Patient taking differently: Take 60 mg by mouth daily. ), Disp: 30 tablet, Rfl: 1 .  Melatonin 3 MG TABS, Take 3 mg by mouth at bedtime., Disp: , Rfl:  .  meloxicam (MOBIC) 7.5 MG tablet, Take 7.5 mg by mouth daily., Disp: , Rfl:  .  nitroGLYCERIN (NITROSTAT) 0.4 MG SL tablet, Place 1 tablet (0.4 mg total) under the tongue every 5 (five) minutes x 3 doses as needed for chest Gibbs., Disp: 30 tablet, Rfl: 0 .  omeprazole (PRILOSEC) 40 MG capsule, Take 40 mg by mouth daily., Disp: , Rfl:  .  prazosin (MINIPRESS) 1 MG capsule, Take 1 capsule (1 mg total) by mouth at bedtime., Disp: 30 capsule, Rfl: 1 .  ranolazine (RANEXA) 500 MG 12 hr tablet, Take 500 mg by mouth 2 (two) times daily., Disp: , Rfl:  .  rOPINIRole (REQUIP) 0.5 MG tablet, TAKE 1 TO 2 TABLETS BY MOUTH TWICE DAILY AS NEEDED FOR RESTLESS LEGS., Disp: , Rfl: 1 .  tamsulosin (FLOMAX) 0.4 MG CAPS capsule, Take 1 capsule (0.4 mg total) by mouth daily., Disp: 30 capsule, Rfl: 1 .  ticagrelor (BRILINTA) 60 MG TABS tablet, Take 60 mg by mouth 2 (two) times daily. , Disp: , Rfl:   ROS  Constitutional: Denies any fever or chills Gastrointestinal: No reported hemesis, hematochezia, vomiting, or acute GI distress Musculoskeletal: Denies any acute onset joint swelling, redness, loss of ROM, or weakness Neurological: No reported episodes of acute onset apraxia, aphasia, dysarthria, agnosia, amnesia, paralysis, loss of coordination, or loss of consciousness  Allergies  Mr. Lawrence is allergic to latuda [lurasidone hcl]; naproxen; and saphris [asenapine].  [place]  Drug: Mr. Spickler  reports that he does not use drugs. Alcohol:  reports that he drinks alcohol. Tobacco:  reports that he has been smoking cigarettes. He has been smoking about 0.50 packs per day. He has never used smokeless tobacco. Medical:  has a past medical history of Anxiety, Arthritis, Bursitis, COPD (chronic obstructive pulmonary disease) ([place]), Depression,  GERD (gastroesophageal reflux disease), Hepatitis C (2012), Hypertension, Lung disease, MI (myocardial infarction) ([place]), and Stroke ([place]). Surgical: Mr. Rivero  has a past surgical history that includes Appendectomy; Shoulder surgery (Right, 04/09/2012); Back surgery; Abdominal surgery; Colonoscopy; Cardiac catheterization (Left, 06/10/2016); Cardiac catheterization (N/A, 06/10/2016); Esophagogastroduodenoscopy (egd) with propofol (N/A, 01/05/2017); Colonoscopy with propofol (N/A, 01/05/2017); Coronary angioplasty with stent; and Knee arthroscopy with medial menisectomy (Right, 09/05/2017). Family: family history includes Early death in his father; Heart disease in his father and mother; Heart failure in his father; Hypertension in his father, mother, and sister; Osteoarthritis in his mother.  Constitutional Exam  General appearance: Well nourished, well developed, and well hydrated. In no apparent acute distress There were no vitals filed for this visit. BMI Assessment: Estimated body mass index is 27.5 kg/m as calculated from the following:   Height as of 02/15/18: 6' 3"  (1.905 m).   Weight as of 02/15/18: 220 lb (99.8 kg).  BMI interpretation table: BMI level Category Range association with higher incidence of chronic Gibbs  <18 kg/m2 Underweight   18.5-24.9 kg/m2 Ideal body weight   25-29.9 kg/m2 Overweight Increased incidence by 20%  30-34.9 kg/m2 Obese (Class I) Increased incidence by 68%  35-39.9 kg/m2 Severe obesity (Class II) Increased incidence by 136%  >40 kg/m2 Extreme obesity (Class III) Increased incidence by 254%   Patient's current BMI Ideal Body weight  There is no height or weight on file to calculate BMI. Patient weight not recorded   BMI Readings from Last 4 Encounters:  02/15/18 27.50 kg/m  02/01/18 26.87 kg/m  01/23/18 26.87 kg/m  01/09/18 26.50 kg/m   Wt Readings from Last 4 Encounters:  02/15/18 220 lb (99.8 kg)  02/01/18 215 lb (97.5 kg)  01/23/18 215 lb (97.5  kg)  01/09/18 212 lb (96.2 kg)  Psych/Mental status: Alert, oriented x 3 (person, place, & time)       Eyes: PERLA Respiratory: No evidence of acute respiratory distress  Cervical Spine Area Exam  Skin & Axial Inspection: No masses, redness, edema, swelling, or associated skin lesions Alignment: Symmetrical Functional ROM: Unrestricted ROM      Stability: No instability detected Muscle Tone/Strength: Functionally intact. No obvious neuro-muscular anomalies detected. Sensory (Neurological): Unimpaired Palpation: No palpable anomalies              Upper Extremity (UE) Exam    Side: Right upper extremity  Side: Left upper extremity  Skin & Extremity Inspection: Skin color, temperature, and hair growth are WNL. No peripheral edema or cyanosis. No masses, redness, swelling, asymmetry, or associated skin lesions. No contractures.  Skin & Extremity Inspection: Skin color, temperature, and hair growth are WNL. No peripheral edema or cyanosis. No masses, redness, swelling, asymmetry, or associated skin lesions. No contractures.  Functional ROM: Unrestricted ROM          Functional ROM: Unrestricted ROM          Muscle Tone/Strength: Functionally intact. No obvious neuro-muscular anomalies detected.  Muscle Tone/Strength: Functionally intact. No obvious neuro-muscular anomalies detected.  Sensory (Neurological): Unimpaired          Sensory (Neurological): Unimpaired          Palpation: No palpable anomalies              Palpation: No palpable anomalies              Provocative Test(s):  Phalen's test: deferred Tinel's test: deferred Apley's scratch test (touch opposite shoulder):  Action 1 (Across chest): deferred Action 2 (Overhead): deferred Action 3 (LB reach): deferred   Provocative Test(s):  Phalen's test: deferred Tinel's test: deferred Apley's scratch test (touch opposite shoulder):  Action 1 (Across chest): deferred Action 2 (Overhead): deferred Action 3 (LB reach): deferred     Thoracic Spine Area Exam  Skin & Axial Inspection: No masses, redness, or swelling Alignment: Symmetrical Functional ROM: Unrestricted ROM Stability: No instability detected Muscle Tone/Strength: Functionally intact. No obvious neuro-muscular anomalies detected. Sensory (Neurological): Unimpaired Muscle strength & Tone: No palpable anomalies  Lumbar Spine Area Exam  Skin & Axial Inspection: No masses, redness, or swelling Alignment: Symmetrical Functional ROM: Unrestricted ROM       Stability: No instability detected Muscle Tone/Strength: Functionally intact. No obvious neuro-muscular anomalies detected. Sensory (Neurological): Unimpaired Palpation: No palpable anomalies       Provocative Tests: Hyperextension/rotation test: deferred today       Lumbar quadrant test (Kemp's test): deferred today       Lateral bending test: deferred today       Patrick's Maneuver: deferred today                   FABER test: deferred today                   S-I anterior distraction/compression test: deferred today         S-I lateral compression test: deferred today         S-I Thigh-thrust test: deferred today         S-I Gaenslen's test: deferred today  Gait & Posture Assessment  Ambulation: Unassisted Gait: Relatively normal for age and body habitus Posture: WNL   Lower Extremity Exam    Side: Right lower extremity  Side: Left lower extremity  Stability: No instability observed          Stability: No instability observed          Skin & Extremity Inspection: Skin color, temperature, and hair growth are WNL. No peripheral edema or cyanosis. No masses, redness, swelling, asymmetry, or associated skin lesions. No contractures.  Skin & Extremity Inspection: Skin color, temperature, and hair growth are WNL. No peripheral edema or cyanosis. No masses, redness, swelling, asymmetry, or associated skin lesions. No contractures.  Functional ROM: Unrestricted ROM                  Functional  ROM: Unrestricted ROM                  Muscle Tone/Strength: Functionally intact. No obvious neuro-muscular anomalies detected.  Muscle Tone/Strength: Functionally intact. No obvious neuro-muscular anomalies detected.  Sensory (Neurological): Unimpaired  Sensory (Neurological): Unimpaired  Palpation: No palpable anomalies  Palpation: No palpable anomalies   Assessment  Keith Diagnosis & Pertinent Problem List: The Keith encounter diagnosis was Chronic knee Gibbs (Keith Gibbs)  (Bilateral) (R>L). Diagnoses of Chronic low back Gibbs (Keith Gibbs) (Bilateral), Chronic hip Gibbs (Keith Gibbs) (Bilateral) (R>L), Chronic shoulder Gibbs (Keith Gibbs) (Right), Chronic neck Gibbs (Keith Gibbs) (Midline), and Chronic Gibbs syndrome were also pertinent to this visit.  Status Diagnosis  Controlled Controlled Controlled 1. Chronic knee Gibbs (Keith Gibbs)  (Bilateral) (R>L)   2. Chronic low back Gibbs (Keith Gibbs) (Bilateral)   3. Chronic hip Gibbs (Keith Gibbs) (Bilateral) (R>L)   4. Chronic shoulder Gibbs (Keith Gibbs) (Right)   5. Chronic neck Gibbs (Keith Gibbs) (Midline)   6. Chronic Gibbs syndrome     Problems updated and reviewed during this visit: No problems updated. Plan of Care  Pharmacotherapy (Medications Ordered): No orders of the defined types were placed in this encounter.  Medications administered today: Eldredge W. Daigler had no medications administered during this visit.  Procedure Orders    No procedure(s) ordered today   Lab Orders  No laboratory test(s) ordered today   Imaging Orders  No imaging studies ordered today   Referral Orders  No referral(s) requested today   Interventional management options: Planned, scheduled, and/or pending:   None at this time. Now that knee Gibbs has improved, consider "Drug Holiday". We'll assess the patient for his low back Gibbs, right shoulder Gibbs,  and neck Gibbs for the possibility of interventional therapies as delineated below. Medication management will continue to be provided by Janice Norrie, NP under my supervision.    Considering:   Diagnostic left intra-articular knee injection w/ steroids #2  Palliative left-sided Hyalgan knee injection series #2 (last one completed on 02/15/2018) Diagnostic bilateral genicular nerve block Possiblebilateral genicular nerve RFA Diagnostic lumbar facet nerve block Possible bilateral lumbar facet RFA Diagnostic bilateral hip injections Diagnosticright suprascapular nerve block Possible right suprascapular nerve RFA Diagnosticbilateral cervical facet nerve block Possible bilateral cervical RFA   Palliative PRN treatment(s):   None at this time   Provider-requested follow-up: No follow-ups on file.  Future Appointments  Date Time Provider [place]  03/07/2018  9:15 AM Milinda Pointer, MD [facility] None  04/24/2018  9:00 AM Bacigalupo,  Dionne Bucy, MD [facility] None   Keith Care Physician: Jodi Marble, MD Location: [facility] Facility Note by: Gaspar Cola, MD Date: 03/07/2018; Time: 6:26 AM

## 2018-03-07 ENCOUNTER — Ambulatory Visit: Payer: Self-pay | Admitting: Pain Medicine

## 2018-03-07 ENCOUNTER — Encounter: Payer: Self-pay | Admitting: Family Medicine

## 2018-03-12 ENCOUNTER — Encounter: Payer: Self-pay | Admitting: Family Medicine

## 2018-04-02 ENCOUNTER — Ambulatory Visit (INDEPENDENT_AMBULATORY_CARE_PROVIDER_SITE_OTHER): Payer: Medicaid Other | Admitting: Family Medicine

## 2018-04-02 ENCOUNTER — Encounter: Payer: Self-pay | Admitting: Family Medicine

## 2018-04-02 VITALS — BP 112/68 | HR 59 | Temp 97.5°F | Ht 75.0 in | Wt 221.6 lb

## 2018-04-02 DIAGNOSIS — M8949 Other hypertrophic osteoarthropathy, multiple sites: Secondary | ICD-10-CM

## 2018-04-02 DIAGNOSIS — F332 Major depressive disorder, recurrent severe without psychotic features: Secondary | ICD-10-CM

## 2018-04-02 DIAGNOSIS — N400 Enlarged prostate without lower urinary tract symptoms: Secondary | ICD-10-CM

## 2018-04-02 DIAGNOSIS — E785 Hyperlipidemia, unspecified: Secondary | ICD-10-CM

## 2018-04-02 DIAGNOSIS — J431 Panlobular emphysema: Secondary | ICD-10-CM | POA: Diagnosis not present

## 2018-04-02 DIAGNOSIS — B182 Chronic viral hepatitis C: Secondary | ICD-10-CM | POA: Diagnosis not present

## 2018-04-02 DIAGNOSIS — M150 Primary generalized (osteo)arthritis: Secondary | ICD-10-CM

## 2018-04-02 DIAGNOSIS — I25118 Atherosclerotic heart disease of native coronary artery with other forms of angina pectoris: Secondary | ICD-10-CM

## 2018-04-02 DIAGNOSIS — K219 Gastro-esophageal reflux disease without esophagitis: Secondary | ICD-10-CM | POA: Diagnosis not present

## 2018-04-02 DIAGNOSIS — F1411 Cocaine abuse, in remission: Secondary | ICD-10-CM

## 2018-04-02 DIAGNOSIS — R3121 Asymptomatic microscopic hematuria: Secondary | ICD-10-CM

## 2018-04-02 DIAGNOSIS — M5136 Other intervertebral disc degeneration, lumbar region: Secondary | ICD-10-CM

## 2018-04-02 DIAGNOSIS — F1220 Cannabis dependence, uncomplicated: Secondary | ICD-10-CM

## 2018-04-02 DIAGNOSIS — Z23 Encounter for immunization: Secondary | ICD-10-CM | POA: Diagnosis not present

## 2018-04-02 DIAGNOSIS — M159 Polyosteoarthritis, unspecified: Secondary | ICD-10-CM

## 2018-04-02 DIAGNOSIS — F319 Bipolar disorder, unspecified: Secondary | ICD-10-CM

## 2018-04-02 DIAGNOSIS — K7469 Other cirrhosis of liver: Secondary | ICD-10-CM

## 2018-04-02 DIAGNOSIS — F17200 Nicotine dependence, unspecified, uncomplicated: Secondary | ICD-10-CM

## 2018-04-02 DIAGNOSIS — F1020 Alcohol dependence, uncomplicated: Secondary | ICD-10-CM

## 2018-04-02 DIAGNOSIS — G894 Chronic pain syndrome: Secondary | ICD-10-CM

## 2018-04-02 MED ORDER — PRAZOSIN HCL 1 MG PO CAPS: 3.0000 mg | ORAL_CAPSULE | Freq: Every day | ORAL | 1 refills | Status: DC

## 2018-04-02 NOTE — Progress Notes (Signed)
Patient: Keith Gibbs, Male    DOB: Oct 31, 1960, 57 y.o.   MRN: [id number] Visit Date: 04/03/2018  Today's Provider: Lavon Paganini, MD   Chief Complaint  Patient presents with  . Annual Exam   Subjective:  I, Keith Gibbs, CMA, am acting as Gibbs scribe for Keith Paganini, MD.   New Patient: Keith Gibbs is Gibbs 57 year old male who presents today to [facility] as Gibbs new patient. Patient was previously being seen at [facility] by Keith Gibbs. Patient is doing well with current medication regimen.   Of note, patient is Gibbs poor historian and the majority of his HPI is taken from review of records from what is available in epic.  His records will also be requested from his PCP, cardiologist, pulmonologist, and psychiatrist.  Patient has history of chronic pain syndrome.  He has osteoarthritis of multiple joints, including both knees, both hips, both shoulders, as well as DJD of the spine.  He has had some spinal surgery in the past, unsure of what kind of surgery.  He has tried cortisone injections of multiple joints, physical therapy, chronic opioid therapy in the past without relief.  He is currently being followed by the [facility] pain management clinic by Keith Gibbs.  He is undergoing Synvisc injections in his knee.  He was last seen in in July 2019.  He thinks he may need Gibbs new referral.  He no longer plans to move to [place].  Patient has Gibbs history of depression and anxiety.  Is also noted in his chart that he has Gibbs history of bipolar 1 disorder and schizophrenia.  He denies these 2 diagnoses.  He has been followed by Keith Gibbs at [facility].  Patient was previously on Abilify, Depakote, and Wellbutrin.  He states he stopped taking all of these.  He states he feels better now that he is off of his medications.  He does continue to take his prazosin however.  Patient reports history of CVA.  He denies any lasting deficits.  He is not sure when this  happened.  He does take Gibbs baby aspirin daily, as well as Gibbs statin.  He does not follow with neurology.  Patient has Gibbs history of MI in 2017.  He was found to have CAD with 95% blockage of the LAD.  He underwent stenting and has done well since then.  He denies any chest pain or shortness of breath.  He is followed by Keith Gibbs with cardiology.  He states that he also sees Keith Gibbs for his COPD/emphysema.  He notes that something was found on his last imaging that was concerning for lung cancer.  He thinks he is getting another CT scan to find out if he may have lung cancer.  He has been Gibbs long-term smoker, but recently quit smoking cigarettes about 3 weeks ago.  He is taking Chantix which was prescribed by pulmonology.  Patient has Gibbs history of polysubstance abuse.  He has not used cocaine in Gibbs few years.  He does continue to smoke marijuana socially.  He does continue to drink alcohol socially.  Cirrhosis: Patient was previously seeing [facility] for cirrhosis of the liver.  In 03/2016 right upper quadrant ultrasound revealed 1.2 cm right liver lobe cyst.  On Gibbs CT scan in 08/2016, Gibbs 1.5 x 1.1 cm lesion in the liver and mild gallbladder wall thickening was found incidentally.  In 08/2016, MRI of the liver showed hepatic  cirrhosis with tiny right hepatic lobe cyst.  He states that he was treated for hepatitis C in the past and was cured.  He thinks this stemmed from Gibbs blood transfusion at age 43.  He does have Gibbs history of IV drug use.  He also used to be significantly heavier and weighed over 300 pounds years ago.  Gastroenterology felt that the hepatic lobe cyst was benign and not worrisome.  They recommended EGD to screen for varices, obtaining viral and autoimmune screen, checking right upper quadrant ultrasound every 6 months to screen for [facility].  Colonoscopy in 12/2016 showed nonbleeding internal hemorrhoids, diverticulosis in the sigmoid colon, two 3 to 5 mm polyps in the transverse colon and  then the cecum, 3 5 to 7 mm polyps in the ascending colon.  Recommended repeat colonoscopy in 3 years.  EGD in 12/2016 revealed normal esophagus, stomach, and duodenum with no varices.  Recommended repeat EGD in 3 years.  Hepatitis C viral load not detectable.  Patient is immune to hepatitis Gibbs and B.  LFTs normal.  Appears patient has not followed up with GI since having endoscopies.  Patient was incidentally found to have microscopic hematuria in 06/2016.  He saw urology and underwent cystoscopy in 07/2016 which was normal.  CT hematuria in 08/2016 was benign.  He was also being followed for prostate asymmetry with left base greater than right.  PSA was 0.9.  He was assumed to have BPH and started on Gibbs trial of Flomax.  Also had was having intermittent testicular pain of the right testicle which was slightly larger than the left with no discrete masses.  Scrotal ultrasound in 07/2016 was normal.  He has not been followed by urology since that time. -----------------------------------------------------------------  Review of Systems  Constitutional: Positive for activity change, appetite change and diaphoresis.  HENT: Positive for congestion, dental problem, drooling, ear pain, hearing loss, rhinorrhea and tinnitus.   Eyes: Negative.   Respiratory: Positive for chest tightness, shortness of breath and wheezing.   Cardiovascular: Negative.   Gastrointestinal: Positive for abdominal distention, abdominal pain, diarrhea and nausea.  Endocrine: Negative.   Genitourinary: Positive for scrotal swelling.  Musculoskeletal: Negative.   Skin: Negative.   Allergic/Immunologic: Negative.   Neurological: Negative.   Hematological: Negative.   Psychiatric/Behavioral: Negative.     Social History      He  reports that he quit smoking about 4 weeks ago. His smoking use included cigarettes. He has Gibbs 16.00 pack-year smoking history. He has never used smokeless tobacco. He reports that he drinks about 6.0 standard  drinks of alcohol per week. He reports that he has current or past drug history. Drugs: Cocaine and Marijuana.       Social History   Socioeconomic History  . Marital status: Single    Spouse name: Not on file  . Number of children: Not on file  . Years of education: Not on file  . Highest education level: Not on file  Occupational History  . Occupation: disability  . Occupation: stocking at gas station    Comment: 2 days per week  Social Needs  . Financial resource strain: Not on file  . Food insecurity:    Worry: Not on file    Inability: Not on file  . Transportation needs:    Medical: Not on file    Non-medical: Not on file  Tobacco Use  . Smoking status: Former Smoker    Packs/day: 0.50    Years: 32.00  Pack years: 16.00    Types: Cigarettes    Last attempt to quit: 03/02/2018    Years since quitting: 0.0  . Smokeless tobacco: Never Used  Substance and Sexual Activity  . Alcohol use: Yes    Alcohol/week: 6.0 standard drinks    Types: 6 Cans of beer per week    Comment: occassionally   . Drug use: Not Currently    Types: Cocaine, Marijuana    Comment: last use couple months ago  . Sexual activity: Yes    Partners: Female    Birth control/protection: None  Lifestyle  . Physical activity:    Days per week: Not on file    Minutes per session: Not on file  . Stress: Not on file  Relationships  . Social connections:    Talks on phone: Not on file    Gets together: Not on file    Attends religious service: Not on file    Active member of club or organization: Not on file    Attends meetings of clubs or organizations: Not on file    Relationship status: Not on file  Other Topics Concern  . Not on file  Social History Narrative  . Not on file    Past Medical History:  Diagnosis Date  . Anxiety   . Arthritis   . Bursitis   . COPD (chronic obstructive pulmonary disease) ([place])   . Depression   . GERD (gastroesophageal reflux disease)   . Hepatitis C 2012     No longer has Hep C  . Hypertension   . Lung disease   . MI (myocardial infarction) ([place])   . Stroke [facility])      Patient Active Problem List   Diagnosis Date Noted  . Cirrhosis of liver ([place]) 04/03/2018  . BPH (benign prostatic hyperplasia) 04/03/2018  . Asymptomatic microscopic hematuria 04/03/2018  . Osteoarthritis of the knee (Left) 11/02/2017  . Lumbar facet syndrome (Bilateral) 11/01/2017  . Spondylosis without myelopathy or radiculopathy, lumbosacral region 11/01/2017  . Osteoarthritis of shoulder (Right) 11/01/2017  . Osteoarthritis of acromioclavicular joint (Right) 11/01/2017  . 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 Osteoarthritis 11/01/2017  . Cervicalgia 11/01/2017  . Cervical facet syndrome 11/01/2017  . DDD (degenerative disc disease), cervical 11/01/2017  . DDD (degenerative disc disease), lumbar 11/01/2017  . 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) 09/18/2017  . Chronic neck pain (Fifth Area of Pain) ([place]) 09/18/2017  . Chronic musculoskeletal pain 09/18/2017  . Chronic knee pain (Primary Area of Pain)  (Bilateral) (R>L) 09/11/2017  . Chronic low back pain (Secondary Area of Pain) (Bilateral) 09/11/2017  . Chronic hip pain [facility] Area of Pain) (Bilateral) (R>L) 09/11/2017  . Chronic shoulder pain (Fourth Area of Pain) (Right) 09/11/2017  . Spondylosis without myelopathy or radiculopathy, cervical region 09/11/2017  . Chronic pain syndrome 09/11/2017  . Opiate use 09/11/2017  . Pharmacologic therapy 09/11/2017  . Disorder of skeletal system 09/11/2017  . Problems influencing health status 09/11/2017  . Overdose of medication, intentional self-harm, sequela ([place]) 12/28/2016  . Tobacco use disorder 12/28/2016  . Severe recurrent major depression without psychotic features ([place]) 12/27/2016  . Coronary artery disease 12/27/2016  . Alcohol use disorder, moderate, dependence  ([place]) 12/27/2016  . Cannabis use disorder, moderate, dependence ([place]) 12/27/2016  . Chest pain 06/10/2016  . Bipolar 1 disorder ([place]) 05/27/2013  . Abnormal ejaculation 11/30/2012  . Chest pain, unspecified 11/30/2012  . Schizophrenia ([place])  06/27/2012  . COPD (chronic obstructive pulmonary disease) ([place]) 06/27/2012  . Chronic hepatitis C ([place]) 06/27/2012  . GERD (gastroesophageal reflux disease) 06/27/2012  . Cocaine abuse in remission ([place]) 12/05/2011    Past Surgical History:  Procedure Laterality Date  . ABDOMINAL SURGERY     removed small piece of intestines due to [facility] Diverticulosis  . APPENDECTOMY    . BACK SURGERY    . CARDIAC CATHETERIZATION Left 06/10/2016   Procedure: Left Heart Cath and Coronary Angiography;  Surgeon: Dionisio David, MD;  Location: [facility];  Service: Cardiovascular;  Laterality: Left;  . CARDIAC CATHETERIZATION N/Gibbs 06/10/2016   Procedure: Coronary Stent Intervention;  Surgeon: Yolonda Kida, MD;  Location: [facility];  Service: Cardiovascular;  Laterality: N/Gibbs;  . COLONOSCOPY    . COLONOSCOPY WITH PROPOFOL N/Gibbs 01/05/2017   Procedure: COLONOSCOPY WITH PROPOFOL;  Surgeon: Jonathon Bellows, MD;  Location: [facility] ENDOSCOPY;  Service: Endoscopy;  Laterality: N/Gibbs;  . CORONARY ANGIOPLASTY WITH STENT PLACEMENT    . ESOPHAGOGASTRODUODENOSCOPY (EGD) WITH PROPOFOL N/Gibbs 01/05/2017   Procedure: ESOPHAGOGASTRODUODENOSCOPY (EGD) WITH PROPOFOL;  Surgeon: Jonathon Bellows, MD;  Location: [facility] ENDOSCOPY;  Service: Endoscopy;  Laterality: N/Gibbs;  . KNEE ARTHROSCOPY WITH MEDIAL MENISECTOMY Right 09/05/2017   Procedure: KNEE ARTHROSCOPY WITH MEDIAL AND LATERAL  MENISECTOMY PARTIAL SYNOVECTOMY;  Surgeon: Hessie Knows, MD;  Location: [facility] ORS;  Service: Orthopedics;  Laterality: Right;  . SHOULDER SURGERY Right 04/09/2012  . [place] History        Family Status  Relation Name Status  . Mother  Alive  . Father  Deceased  . Sister  (Not  Specified)  . Mat Aunt  Deceased  . Mat Uncle  Deceased  . Ethlyn Daniels  Deceased  . Annamarie Major  Deceased  . MGM  Deceased  . MGF  Deceased  . PGM  Deceased  . PGF  Deceased  . Neg Hx  (Not Specified)        His family history includes Early death in his father; Heart attack in his father; Heart disease in his father and mother; Hypertension in his father, mother, and sister; Osteoarthritis in his mother. There is no history of Prostate cancer, Bladder Cancer, or Kidney cancer.      Allergies  Allergen Reactions  . Latuda [Lurasidone Hcl] Other (See Comments)    tremor   . Naproxen Hives and Swelling  . Saphris [Asenapine] Other (See Comments)    Increased tremors      Current Outpatient Medications:  .  acetaminophen (TYLENOL) 500 MG tablet, Take 2 tablets (1,000 mg total) by mouth every 6 (six) hours as needed. For pain., Disp: 90 tablet, Rfl: 1 .  aspirin EC 81 MG tablet, Take 1 tablet (81 mg total) by mouth daily., Disp: 30 tablet, Rfl: 1 .  atorvastatin (LIPITOR) 40 MG tablet, Take 1 tablet (40 mg total) by mouth daily at 6 PM., Disp: 30 tablet, Rfl: 1 .  cetirizine (ZYRTEC) 10 MG tablet, Take 10 mg by mouth daily., Disp: , Rfl:  .  DULERA 200-5 MCG/ACT AERO, TAKE 2 PUFFS BY MOUTH TWICE Gibbs DAY, Disp: 1 Inhaler, Rfl: 1 .  meloxicam (MOBIC) 7.5 MG tablet, Take 7.5 mg by mouth daily., Disp: , Rfl:  .  nitroGLYCERIN (NITROSTAT) 0.4 MG SL tablet, Place 1 tablet (0.4 mg total) under the tongue every 5 (five) minutes x 3 doses as needed for chest pain., Disp: 30 tablet, Rfl: 0 .  omeprazole (PRILOSEC) 40 MG capsule, Take 40 mg by mouth daily., Disp: , Rfl:  .  prazosin (MINIPRESS) 1 MG capsule, Take 3 capsules (3 mg total) by mouth at bedtime., Disp: 30 capsule, Rfl: 1 .  ranolazine (RANEXA) 500 MG 12 hr tablet, Take 500 mg by mouth 2 (two) times daily., Disp: , Rfl:  .  rOPINIRole (REQUIP) 0.5 MG tablet, TAKE 1 TO 2 TABLETS BY MOUTH TWICE DAILY AS NEEDED FOR RESTLESS LEGS., Disp: , Rfl:  1 .  tamsulosin (FLOMAX) 0.4 MG CAPS capsule, Take 1 capsule (0.4 mg total) by mouth daily., Disp: 30 capsule, Rfl: 1 .  ticagrelor (BRILINTA) 60 MG TABS tablet, Take 60 mg by mouth 2 (two) times daily. , Disp: , Rfl:  .  albuterol (PROAIR HFA) 108 (90 Base) MCG/ACT inhaler, Inhale 2 puffs into the lungs every 6 (six) hours as needed for wheezing or shortness of breath. (Patient not taking: Reported on 04/02/2018), Disp: 1 Inhaler, Rfl: 1   Patient Care Team: Virginia Crews, MD as PCP - General (Family Medicine) Festus Aloe, MD as Consulting Physician (Urology) Christene Lye, MD (General Surgery)      Objective:   Vitals: BP 112/68 (BP Location: Right Arm, Patient Position: Sitting, Cuff Size: Normal)   Pulse (!) 59   Temp (!) 97.5 F (36.4 C) (Oral)   Ht 6\' 3"  (1.905 m)   Wt 221 lb 9.6 oz (100.5 kg)   SpO2 96%   BMI 27.70 kg/m    Vitals:   04/02/18 1022  BP: 112/68  Pulse: (!) 59  Temp: (!) 97.5 F (36.4 C)  TempSrc: Oral  SpO2: 96%  Weight: 221 lb 9.6 oz (100.5 kg)  Height: 6\' 3"  (1.905 m)     Physical Exam  Constitutional: He is oriented to person, place, and time. He appears well-developed and well-nourished. No distress.  HENT:  Head: Normocephalic and atraumatic.  Right Ear: External ear normal.  Left Ear: External ear normal.  Nose: Nose normal.  Mouth/Throat: Oropharynx is clear and moist.  Eyes: Pupils are equal, round, and reactive to light. Conjunctivae and EOM are normal. No scleral icterus.  Neck: Neck supple. No thyromegaly present.  Cardiovascular: Normal rate, regular rhythm, normal heart sounds and intact distal pulses.  No murmur heard. Pulmonary/Chest: Effort normal and breath sounds normal. No respiratory distress. He has no wheezes. He has no rales.  Abdominal: Soft. Bowel sounds are normal. He exhibits no distension. There is no tenderness. There is no rebound and no guarding.  Musculoskeletal: He exhibits no edema or  deformity.  Lymphadenopathy:    He has no cervical adenopathy.  Neurological: He is alert and oriented to person, place, and time.  Skin: Skin is warm and dry. Capillary refill takes less than 2 seconds. No rash noted.  Psychiatric: He has Gibbs normal mood and affect. His behavior is normal.  Vitals reviewed.    Depression Screen PHQ 2/9 Scores 04/02/2018 02/15/2018 02/01/2018 01/23/2018  PHQ - 2 Score 2 0 0 0  PHQ- 9 Score 5 - - -  Exception Documentation - - - -      Assessment & Plan:     Routine Health Maintenance and Physical Exam  Exercise Activities and Dietary recommendations Goals   None     Immunization History  Administered Date(s) Administered  . Hep Gibbs / Hep B 06/28/2011, 01/10/2012  . Hepatitis B, adult 08/22/2011  . Influenza, Seasonal, Injecte, Preservative Fre 06/28/2011, 04/23/2012  . Influenza,inj,Quad PF,6+ Mos  06/11/2016, 04/02/2018  . Influenza-Unspecified 05/25/2013  . Pneumococcal Polysaccharide-23 04/23/2012  . Tdap 03/05/2016    Health Maintenance  Topic Date Due  . HIV Screening  08/11/1975  . COLONOSCOPY  01/06/2020  . TETANUS/TDAP  03/05/2026  . INFLUENZA VACCINE  Completed  . Hepatitis C Screening  Completed     Discussed health benefits of physical activity, and encouraged him to engage in regular exercise appropriate for his age and condition.    --------------------------------------------------------------------  Problem List Items Addressed This Visit      Cardiovascular and Mediastinum   Coronary artery disease    Patient with MI in 2017 Followed by cardiology We will request cardiology records Status post stenting and currently asymptomatic Continue current medications Discussed return precautions      Relevant Medications   prazosin (MINIPRESS) 1 MG capsule     Respiratory   COPD (chronic obstructive pulmonary disease) ([place])    Chronic and well controlled without any recent exacerbations He is breathing comfortably  with normal lung exam today He is followed by pulmonology Continue Advair and albuterol as needed Return precautions discussed        Digestive   Chronic hepatitis C ([place]) - Primary    Per patient, he has been treated in the past and cured His last HCV viral load was undetectable      Relevant Orders   Comprehensive metabolic panel (Completed)   GERD (gastroesophageal reflux disease)    Chronic Currently asymptomatic and well controlled Continue Prilosec daily      Relevant Orders   CBC (Completed)   Cirrhosis of liver ([place])    Chronic Currently asymptomatic No varices on most recent EGD in 2018 We will discuss with patient at next visit the need to follow-up regularly with GI to be managed and followed for this This is likely related to his history of hepatitis C as well as alcoholism He needs to have screening right upper quadrant ultrasounds every 6 months to watch for [facility] Next colonoscopy and EGD due in 2021 Currently euvolemic without any ascites Discussed importance of avoiding alcohol and hepatotoxic medications        Musculoskeletal and Integument   Osteoarthritis (Chronic)    Osteoarthritis of multiple joints He is followed by pain management clinic for interventional pain management as well as medical management He has history of chronic opioid use, but is on no opioids at this time      Relevant Orders   Ambulatory referral to Pain Clinic   DDD (degenerative disc disease), lumbar (Chronic)    Chronic problem Followed by pain management for interventional and medical management Has Gibbs history of chronic narcotic use, but on no narcotics at this time      Relevant Orders   Ambulatory referral to Pain Clinic     Genitourinary   BPH (benign prostatic hyperplasia)    Currently well controlled and asymptomatic Previously followed by urology Continue Flomax 0.4 mg daily Given history of prostate irregularities, follow annual PSA      Asymptomatic  microscopic hematuria    Patient underwent benign work-up with urology for this No further work-up indicated at this time        Other   Chronic pain syndrome (Chronic)    Chronic pain syndrome related to osteoarthritis of multiple joints as well as DJD of the spine He is followed by pain management -was re-referred to the same clinic today They follow him for interventional and medical management He has Gibbs history of  long-term opioid use, but he is on no opiates at this time      Relevant Orders   Ambulatory referral to Pain Clinic   Bipolar 1 disorder [facility])    Patient denies being diagnosed with this, but it is diagnosis that is in his chart He is not currently taking his psychiatric medications and his diagnoses are unclear I will request records from his psychiatrist Encouraged him to continue to be seen by his psychiatrist and be managed that way I encouraged him to not stop or change any medications on his own without consulting with the physician that prescribes them      Cocaine abuse in remission [facility])    Encourage patient to continue to avoid      Severe recurrent major depression without psychotic features ([place])    As above, patient is not currently taking any of his psychiatric medications His exact diagnoses are unclear We will request records from his psychiatrist Contracted for safety Not acutely Gibbs danger to himself or others Mood seems appropriate today Encouraged him to continue to follow with his psychiatrist Encouraged him not to stop or change any medications without discussing with the physician that is prescribing them      Alcohol use disorder, moderate, dependence ([place])    Discussed importance of avoidance of alcohol given known cirrhosis      Cannabis use disorder, moderate, dependence ([place])    Discussed avoiding, especially given history of multiple psychiatric conditions      Tobacco use disorder    Congratulated patient on quitting  smoking He can continue Chantix as prescribed by pulmonology       Other Visit Diagnoses    Hyperlipidemia, unspecified hyperlipidemia type       Relevant Medications   prazosin (MINIPRESS) 1 MG capsule   Other Relevant Orders   Lipid panel (Completed)   Comprehensive metabolic panel (Completed)   Need for influenza vaccination       Relevant Orders   Flu Vaccine QUAD 6+ mos PF IM (Fluarix Quad PF) (Completed)      Addressed extensive list of chronic and acute medical problems today requiring extensive time in counseling and coordination of care.  Over half of this 60 minute visit were spent in counseling and coordinating care of multiple medical problems.   Return in about 3 months (around 07/02/2018) for chronic disease f/u (want to get records to review medical history first).   The entirety of the information documented in the History of Present Illness, Review of Systems and Physical Exam were personally obtained by me. Portions of this information were initially documented by Keith Gibbs, CMA and reviewed by me for thoroughness and accuracy.    Virginia Crews, MD, MPH [facility] 04/03/2018 11:29 AM

## 2018-04-03 ENCOUNTER — Telehealth: Payer: Self-pay

## 2018-04-03 DIAGNOSIS — R3121 Asymptomatic microscopic hematuria: Secondary | ICD-10-CM | POA: Insufficient documentation

## 2018-04-03 DIAGNOSIS — N400 Enlarged prostate without lower urinary tract symptoms: Secondary | ICD-10-CM | POA: Insufficient documentation

## 2018-04-03 DIAGNOSIS — R3129 Other microscopic hematuria: Secondary | ICD-10-CM | POA: Insufficient documentation

## 2018-04-03 DIAGNOSIS — K7460 Unspecified cirrhosis of liver: Secondary | ICD-10-CM | POA: Insufficient documentation

## 2018-04-03 LAB — COMPREHENSIVE METABOLIC PANEL
A/G RATIO: 2.1 (ref 1.2–2.2)
ALT: 21 IU/L (ref 0–44)
AST: 20 IU/L (ref 0–40)
Albumin: 4.6 g/dL (ref 3.5–5.5)
Alkaline Phosphatase: 61 IU/L (ref 39–117)
BILIRUBIN TOTAL: 0.8 mg/dL (ref 0.0–1.2)
BUN/Creatinine Ratio: 13 (ref 9–20)
BUN: 13 mg/dL (ref 6–24)
CALCIUM: 9.4 mg/dL (ref 8.7–10.2)
CHLORIDE: 102 mmol/L (ref 96–106)
CO2: 20 mmol/L (ref 20–29)
Creatinine, Ser: 0.99 mg/dL (ref 0.76–1.27)
GFR, EST AFRICAN AMERICAN: 97 mL/min/{1.73_m2} (ref >59)
GFR, EST NON AFRICAN AMERICAN: 84 mL/min/{1.73_m2} (ref >59)
GLUCOSE: 92 mg/dL (ref 65–99)
Globulin, Total: 2.2 g/dL (ref 1.5–4.5)
Potassium: 4.7 mmol/L (ref 3.5–5.2)
Sodium: 136 mmol/L (ref 134–144)
Total Protein: 6.8 g/dL (ref 6.0–8.5)

## 2018-04-03 LAB — LIPID PANEL
CHOL/HDL RATIO: 2.4 ratio (ref 0.0–5.0)
Cholesterol, Total: 100 mg/dL (ref 100–199)
HDL: 42 mg/dL (ref >39)
LDL CALC: 39 mg/dL (ref 0–99)
TRIGLYCERIDES: 96 mg/dL (ref 0–149)
VLDL Cholesterol Cal: 19 mg/dL (ref 5–40)

## 2018-04-03 LAB — CBC
HEMATOCRIT: 41.9 % (ref 37.5–51.0)
HEMOGLOBIN: 14.7 g/dL (ref 13.0–17.7)
MCH: 34.4 pg — ABNORMAL HIGH (ref 26.6–33.0)
MCHC: 35.1 g/dL (ref 31.5–35.7)
MCV: 98 fL — ABNORMAL HIGH (ref 79–97)
Platelets: 186 10*3/uL (ref 150–450)
RBC: 4.27 x10E6/uL (ref 4.14–5.80)
RDW: 12.9 % (ref 12.3–15.4)
WBC: 7.4 10*3/uL (ref 3.4–10.8)

## 2018-04-03 NOTE — Assessment & Plan Note (Signed)
Per patient, he has been treated in the past and cured His last HCV viral load was undetectable

## 2018-04-03 NOTE — Assessment & Plan Note (Signed)
Encourage patient to continue to avoid

## 2018-04-03 NOTE — Assessment & Plan Note (Signed)
Discussed importance of avoidance of alcohol given known cirrhosis

## 2018-04-03 NOTE — Assessment & Plan Note (Signed)
Chronic problem Followed by pain management for interventional and medical management Has a history of chronic narcotic use, but on no narcotics at this time

## 2018-04-03 NOTE — Assessment & Plan Note (Addendum)
Chronic pain syndrome related to osteoarthritis of multiple joints as well as DJD of the spine He is followed by pain management -was re-referred to the same clinic today They follow him for interventional and medical management He has a history of long-term opioid use, but he is on no opiates at this time

## 2018-04-03 NOTE — Assessment & Plan Note (Signed)
Chronic and well controlled without any recent exacerbations He is breathing comfortably with normal lung exam today He is followed by pulmonology Continue Advair and albuterol as needed Return precautions discussed

## 2018-04-03 NOTE — Assessment & Plan Note (Signed)
Patient underwent benign work-up with urology for this No further work-up indicated at this time

## 2018-04-03 NOTE — Assessment & Plan Note (Signed)
Discussed avoiding, especially given history of multiple psychiatric conditions

## 2018-04-03 NOTE — Telephone Encounter (Signed)
Patient advised.

## 2018-04-03 NOTE — Assessment & Plan Note (Signed)
Osteoarthritis of multiple joints He is followed by pain management clinic for interventional pain management as well as medical management He has history of chronic opioid use, but is on no opioids at this time

## 2018-04-03 NOTE — Telephone Encounter (Signed)
-----   Message from Virginia Crews, MD sent at 04/03/2018 10:32 AM EDT ----- Cholesterol looks great.  Normal kidney function, liver function, electrolytes, Blood counts.  Virginia Crews, MD, MPH [facility] 04/03/2018 10:32 AM

## 2018-04-03 NOTE — Assessment & Plan Note (Addendum)
Chronic Currently asymptomatic No varices on most recent EGD in 2018 We will discuss with patient at next visit the need to follow-up regularly with GI to be managed and followed for this This is likely related to his history of hepatitis C as well as alcoholism He needs to have screening right upper quadrant ultrasounds every 6 months to watch for [facility] Next colonoscopy and EGD due in 2021 Currently euvolemic without any ascites Discussed importance of avoiding alcohol and hepatotoxic medications

## 2018-04-03 NOTE — Assessment & Plan Note (Signed)
Currently well controlled and asymptomatic Previously followed by urology Continue Flomax 0.4 mg daily Given history of prostate irregularities, follow annual PSA

## 2018-04-03 NOTE — Assessment & Plan Note (Signed)
Patient denies being diagnosed with this, but it is diagnosis that is in his chart He is not currently taking his psychiatric medications and his diagnoses are unclear I will request records from his psychiatrist Encouraged him to continue to be seen by his psychiatrist and be managed that way I encouraged him to not stop or change any medications on his own without consulting with the physician that prescribes them

## 2018-04-03 NOTE — Assessment & Plan Note (Signed)
Congratulated patient on quitting smoking He can continue Chantix as prescribed by pulmonology

## 2018-04-03 NOTE — Assessment & Plan Note (Signed)
Chronic Currently asymptomatic and well controlled Continue Prilosec daily

## 2018-04-03 NOTE — Assessment & Plan Note (Signed)
Patient with MI in 2017 Followed by cardiology We will request cardiology records Status post stenting and currently asymptomatic Continue current medications Discussed return precautions

## 2018-04-03 NOTE — Assessment & Plan Note (Signed)
As above, patient is not currently taking any of his psychiatric medications His exact diagnoses are unclear We will request records from his psychiatrist Contracted for safety Not acutely a danger to himself or others Mood seems appropriate today Encouraged him to continue to follow with his psychiatrist Encouraged him not to stop or change any medications without discussing with the physician that is prescribing them

## 2018-04-04 ENCOUNTER — Encounter: Payer: Self-pay | Admitting: Family Medicine

## 2018-04-12 ENCOUNTER — Other Ambulatory Visit: Payer: Self-pay

## 2018-04-12 ENCOUNTER — Encounter: Payer: Self-pay | Admitting: Nurse Practitioner

## 2018-04-12 ENCOUNTER — Ambulatory Visit: Payer: Medicaid Other | Attending: Nurse Practitioner | Admitting: Nurse Practitioner

## 2018-04-12 VITALS — BP 119/76 | HR 54 | Temp 97.7°F | Resp 16 | Ht 75.0 in | Wt 217.0 lb

## 2018-04-12 DIAGNOSIS — M25511 Pain in right shoulder: Secondary | ICD-10-CM | POA: Insufficient documentation

## 2018-04-12 DIAGNOSIS — M545 Low back pain: Secondary | ICD-10-CM | POA: Insufficient documentation

## 2018-04-12 DIAGNOSIS — G894 Chronic pain syndrome: Secondary | ICD-10-CM | POA: Diagnosis not present

## 2018-04-12 DIAGNOSIS — K7460 Unspecified cirrhosis of liver: Secondary | ICD-10-CM | POA: Insufficient documentation

## 2018-04-12 DIAGNOSIS — M542 Cervicalgia: Secondary | ICD-10-CM | POA: Diagnosis not present

## 2018-04-12 DIAGNOSIS — J449 Chronic obstructive pulmonary disease, unspecified: Secondary | ICD-10-CM | POA: Insufficient documentation

## 2018-04-12 DIAGNOSIS — B182 Chronic viral hepatitis C: Secondary | ICD-10-CM | POA: Diagnosis not present

## 2018-04-12 DIAGNOSIS — M25561 Pain in right knee: Secondary | ICD-10-CM | POA: Diagnosis not present

## 2018-04-12 DIAGNOSIS — K219 Gastro-esophageal reflux disease without esophagitis: Secondary | ICD-10-CM | POA: Diagnosis not present

## 2018-04-12 DIAGNOSIS — F1410 Cocaine abuse, uncomplicated: Secondary | ICD-10-CM | POA: Insufficient documentation

## 2018-04-12 DIAGNOSIS — G8929 Other chronic pain: Secondary | ICD-10-CM | POA: Diagnosis present

## 2018-04-12 DIAGNOSIS — M47816 Spondylosis without myelopathy or radiculopathy, lumbar region: Secondary | ICD-10-CM | POA: Diagnosis not present

## 2018-04-12 DIAGNOSIS — M25562 Pain in left knee: Secondary | ICD-10-CM | POA: Insufficient documentation

## 2018-04-12 DIAGNOSIS — I2510 Atherosclerotic heart disease of native coronary artery without angina pectoris: Secondary | ICD-10-CM | POA: Diagnosis not present

## 2018-04-12 DIAGNOSIS — M47812 Spondylosis without myelopathy or radiculopathy, cervical region: Secondary | ICD-10-CM | POA: Insufficient documentation

## 2018-04-12 DIAGNOSIS — F339 Major depressive disorder, recurrent, unspecified: Secondary | ICD-10-CM | POA: Insufficient documentation

## 2018-04-12 DIAGNOSIS — M5136 Other intervertebral disc degeneration, lumbar region: Secondary | ICD-10-CM | POA: Insufficient documentation

## 2018-04-12 DIAGNOSIS — M47817 Spondylosis without myelopathy or radiculopathy, lumbosacral region: Secondary | ICD-10-CM

## 2018-04-12 DIAGNOSIS — M25551 Pain in right hip: Secondary | ICD-10-CM | POA: Insufficient documentation

## 2018-04-12 DIAGNOSIS — M5030 Other cervical disc degeneration, unspecified cervical region: Secondary | ICD-10-CM | POA: Insufficient documentation

## 2018-04-12 DIAGNOSIS — M19011 Primary osteoarthritis, right shoulder: Secondary | ICD-10-CM | POA: Diagnosis not present

## 2018-04-12 DIAGNOSIS — Z79891 Long term (current) use of opiate analgesic: Secondary | ICD-10-CM | POA: Insufficient documentation

## 2018-04-12 DIAGNOSIS — F1029 Alcohol dependence with unspecified alcohol-induced disorder: Secondary | ICD-10-CM | POA: Diagnosis not present

## 2018-04-12 MED ORDER — HYDROCODONE-ACETAMINOPHEN 5-325 MG PO TABS: 1.0000 | ORAL_TABLET | Freq: Four times a day (QID) | ORAL | 0 refills | Status: AC | PRN

## 2018-04-12 NOTE — Progress Notes (Signed)
Patient's Name: ROBB SIBAL  MRN: [id number]  Referring Provider: Virginia Crews, MD  DOB: Dec 01, 1960  PCP: Virginia Crews, MD  DOS: 04/12/2018  Note by: Vevelyn Francois NP  Service setting: Ambulatory outpatient  Specialty: Interventional Pain Management  Location: [facility] (AMB) Pain Management Facility    Patient type: Established    Primary Reason(s) for Visit: Encounter for prescription drug management & post-procedure evaluation of chronic illness with mild to moderate exacerbation(Level of risk: moderate) CC: Back Pain (lower) and Hip Pain (right)  HPI  Mr. Hector is a 57 y.o. year old, male patient, who comes today for a post-procedure evaluation and medication management. He has Schizophrenia ([place]); COPD (chronic obstructive pulmonary disease) ([place]); Chronic hepatitis C ([place]); GERD (gastroesophageal reflux disease); Abnormal ejaculation; Chest pain, unspecified; Bipolar 1 disorder ([place]); Cocaine abuse in remission ([place]); Chest pain; Severe recurrent major depression without psychotic features ([place]); Coronary artery disease; Alcohol use disorder, moderate, dependence ([place]); Cannabis use disorder, moderate, dependence ([place]); Overdose of medication, intentional self-harm, sequela ([place]); Tobacco use disorder; Chronic knee pain (Primary Area of Pain)  (Bilateral) (R>L); Chronic low back pain (Secondary Area of Pain) (Bilateral); Chronic hip pain (Tertiary Area of Pain) (Bilateral) (R>L); Chronic shoulder pain (Fourth Area of Pain) (Right); Spondylosis without myelopathy or radiculopathy, cervical region; Chronic pain syndrome; Opiate use; Pharmacologic therapy; Disorder of skeletal system; Problems influencing health status; 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); Chronic neck pain (Fifth Area of Pain) (Midline); Chronic musculoskeletal pain; 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 His primarily concern today is the Back Pain (lower) and Hip Pain (right)  Pain Assessment: Location:   Back Radiating: to right hip; right hand is "painful and swollen" Onset: More than a month ago Duration: Chronic pain Quality: Aching, Constant, Burning, Dull Severity: 10-Worst pain ever/10 (subjective, self-reported pain score)  Note: Reported level is compatible with observation. Clinically the patient looks like a 0/10             When using our objective Pain Scale, levels between 6 and 10/10 are said to belong in an emergency room, as it progressively worsens from a 6/10, described as severely limiting, requiring emergency care not usually available at an outpatient pain management facility. At a 6/10 level, communication becomes difficult and requires great effort. Assistance to reach the emergency department may be required. Facial flushing and profuse sweating along with potentially dangerous increases in heart rate and blood pressure will be evident. Effect on ADL: limits daily activities, "I hardly ever leave the house" Timing: Constant Modifying factors: laying down, meds BP: 119/76  HR: (!) 54  Mr. Mehlhaff was last seen on 03/07/2018 for a procedure. During today's appointment we reviewed Mr. Tibbitts's post-procedure results, as well as his outpatient medication regimen. He admits that he was going to move from his current resident because he was 3 months behind in his payments. He admits that he has been out of his  medication secondary to the planned move. He admits that he has joint pain. He admits that his back pain is worse. He admits that there is  stinging pain in his right hip also.    Further details on both, my assessment(s), as well as the proposed treatment plan, please see below.  Controlled Substance Pharmacotherapy Assessment REMS (Risk Evaluation and Mitigation Strategy)  Analgesic:Hydrocodone/acetaminophen 5/325 mg 1 tablet in the 6hours (20 mg/dayof Hydrocodone) MME/day:44m/day WRise Patience RN  04/12/2018 10:11 AM  Sign at close encounter Nursing Pain Medication Assessment:  Safety precautions to be maintained throughout the outpatient stay will include: orient to surroundings, keep bed in low position, maintain call bell within reach at all times, provide assistance with transfer out of bed and ambulation.  Medication Inspection Compliance: Mr. AMinnehandid not comply with our request to bring his pills to be counted. He was reminded that bringing the medication bottles, even when empty, is a requirement.  Medication: None brought in. Pill/Patch Count: None available to be counted. Bottle Appearance: No container available. Did not bring bottle(s) to appointment. Filled Date: N/A Last Medication intake:  Today   Pharmacokinetics: Liberation and absorption (onset of action): WNL Distribution (time to peak effect): WNL Metabolism and excretion (duration of action): WNL         Pharmacodynamics: Desired effects: Analgesia: Mr. AThackstonreports >50% benefit. Functional ability: Patient reports that medication allows him to accomplish basic ADLs Clinically meaningful improvement in function (CMIF): Sustained CMIF goals met Perceived effectiveness: Described as relatively effective, allowing for increase in activities of daily living (ADL) Undesirable effects: Side-effects or Adverse reactions: None reported Monitoring: [place] PMP: Online review of the past 149-montheriod conducted.  Compliant with practice rules and regulations Last UDS on record: Summary  Date Value Ref Range Status  09/11/2017 FINAL  Final    Comment:    ==================================================================== TOXASSURE COMP DRUG ANALYSIS,UR ==================================================================== Test                             Result       Flag       Units Drug Present and Declared for Prescription Verification   Hydrocodone                    1255         EXPECTED   ng/mg creat   Dihydrocodeine                 168          EXPECTED   ng/mg creat   Norhydrocodone                 820          EXPECTED   ng/mg creat    Sources of hydrocodone include scheduled prescription    medications. Dihydrocodeine and norhydrocodone are expected    metabolites of hydrocodone. Dihydrocodeine is also available as a    scheduled prescription medication.   Desmethylcyclobenzaprine       PRESENT      EXPECTED    Desmethylcyclobenzaprine is an expected metabolite of    cyclobenzaprine.   Bupropion                      PRESENT      EXPECTED   Hydroxybupropion               PRESENT      EXPECTED    Hydroxybupropion is an expected metabolite of bupropion.   Acetaminophen  PRESENT      EXPECTED Drug Present not Declared for Prescription Verification   Guaifenesin                    PRESENT      UNEXPECTED    Guaifenesin may be administered as an over-the-counter or    prescription drug; it may also be present as a breakdown product    of methocarbamol. Drug Absent but Declared for Prescription Verification   Salicylate                     Not Detected UNEXPECTED    Aspirin, as indicated in the declared medication list, is not    always detected even when used as directed. ==================================================================== Test                      Result    Flag   Units      Ref Range   Creatinine              74               mg/dL       >=20 ==================================================================== Declared Medications:  The flagging and interpretation on this report are based on the  following declared medications.  Unexpected results may arise from  inaccuracies in the declared medications.  **Note: The testing scope of this panel includes these medications:  Bupropion  Cyclobenzaprine  Hydrocodone (Hydrocodone-Acetaminophen)  **Note: The testing scope of this panel does not include small to  moderate amounts of these reported medications:  Acetaminophen  Acetaminophen (Hydrocodone-Acetaminophen)  Aspirin (Aspirin 81)  **Note: The testing scope of this panel does not include following  reported medications:  Albuterol  Atorvastatin  Cetirizine  Isosorbide Mononitrate  Melatonin  Mometasone  Multivitamin  Nitroglycerin  Omeprazole  Prazosin (Minipress)  Ranolazine (Ranexa)  Ropinirole (Requip)  Tamsulosin (Flomax)  Ticagrelor (Brilinta) ==================================================================== For clinical consultation, please call [phone]. ====================================================================    UDS interpretation: Compliant          Medication Assessment Form: Reviewed. Patient indicates being compliant with therapy Treatment compliance: Compliant Risk Assessment Profile: Aberrant behavior: See prior evaluations. None observed or detected today Comorbid factors increasing risk of overdose: See prior notes. No additional risks detected today Opioid risk tool (ORT) (Total Score): 13 Personal History of Substance Abuse (SUD-Substance use disorder):  Alcohol: Positive Male or Male  Illegal Drugs: Positive Male or Male  Rx Drugs: Negative  ORT Risk Level calculation: High Risk Risk of substance use disorder (SUD): Low Opioid Risk Tool - 04/12/18 1007      Family History of Substance Abuse   Alcohol  Positive Male    Illegal Drugs  Positive Male    Rx  Drugs  Negative      Personal History of Substance Abuse   Alcohol  Positive Male or Male    Illegal Drugs  Positive Male or Male    Rx Drugs  Negative      Age   Age between 62-45 years   No      History of Preadolescent Sexual Abuse   History of Preadolescent Sexual Abuse  Negative or Male      Psychological Disease   Psychological Disease  Negative    Depression  Negative      Total Score   Opioid Risk Tool Scoring  13    Opioid Risk Interpretation  High Risk  ORT Scoring interpretation table:  Score <3 = Low Risk for SUD  Score between 4-7 = Moderate Risk for SUD  Score >8 = High Risk for Opioid Abuse   Risk Mitigation Strategies:  Patient Counseling: Covered Patient-Prescriber Agreement (PPA): Present and active  Notification to other healthcare providers: Done  Pharmacologic Plan: No change in therapy, at this time.             Post-Procedure Assessment  02/15/2018 Procedure: Left knee Hyalgan series Pre-procedure pain score:  4/10 Post-procedure pain score: 0/10         Influential Factors: BMI: 27.12 kg/m Intra-procedural challenges: None observed.         Assessment challenges: None detected.              Reported side-effects: None.        Post-procedural adverse reactions or complications: None reported         Sedation: Please see nurses note. When no sedatives are used, the analgesic levels obtained are directly associated to the effectiveness of the local anesthetics. However, when sedation is provided, the level of analgesia obtained during the initial 1 hour following the intervention, is believed to be the result of a combination of factors. These factors may include, but are not limited to: 1. The effectiveness of the local anesthetics used. 2. The effects of the analgesic(s) and/or anxiolytic(s) used. 3. The degree of discomfort experienced by the patient at the time of the procedure. 4. The patients ability and reliability in recalling and  recording the events. 5. The presence and influence of possible secondary gains and/or psychosocial factors. Reported result: Relief experienced during the 1st hour after the procedure: 100 % (Ultra-Short Term Relief)            Interpretative annotation: Clinically appropriate result. Analgesia during this period is likely to be Local Anesthetic and/or IV Sedative (Analgesic/Anxiolytic) related.          Effects of local anesthetic: The analgesic effects attained during this period are directly associated to the localized infiltration of local anesthetics and therefore cary significant diagnostic value as to the etiological location, or anatomical origin, of the pain. Expected duration of relief is directly dependent on the pharmacodynamics of the local anesthetic used. Long-acting (4-6 hours) anesthetics used.  Reported result: Relief during the next 4 to 6 hour after the procedure: 100 % (Short-Term Relief)            Interpretative annotation: Clinically appropriate result. Analgesia during this period is likely to be Local Anesthetic-related.          Long-term benefit: Defined as the period of time past the expected duration of local anesthetics (1 hour for short-acting and 4-6 hours for long-acting). With the possible exception of prolonged sympathetic blockade from the local anesthetics, benefits during this period are typically attributed to, or associated with, other factors such as analgesic sensory neuropraxia, antiinflammatory effects, or beneficial biochemical changes provided by agents other than the local anesthetics.  Reported result: Extended relief following procedure: 100 % (Long-Term Relief)            Interpretative annotation: Clinically possible results. Good relief. No permanent benefit expected. Inflammation plays a part in the etiology to the pain.          Current benefits: Defined as reported results that persistent at this point in time.   Analgesia: 75-100 % Mr. Curley  reports improvement of extremity symptoms. Function: Somewhat improved ROM: Somewhat improved Interpretative annotation: Ongoing  benefit.              Interpretation: Results would suggest a successful diagnostic intervention.                  Plan:  Please see "Plan of Care" for details.                Laboratory Chemistry  Inflammation Markers (CRP: Acute Phase) (ESR: Chronic Phase) Lab Results  Component Value Date   CRP 24.6 (H) 09/11/2017   ESRSEDRATE 16 09/11/2017   LATICACIDVEN 1.2 02/20/2017                         Rheumatology Markers Lab Results  Component Value Date   ANA Negative 11/01/2016                        Renal Function Markers Lab Results  Component Value Date   BUN 13 04/02/2018   CREATININE 0.99 04/02/2018   BCR 13 04/02/2018   GFRAA 97 04/02/2018   GFRNONAA 84 04/02/2018                             Hepatic Function Markers Lab Results  Component Value Date   AST 20 04/02/2018   ALT 21 04/02/2018   ALBUMIN 4.6 04/02/2018   ALKPHOS 61 04/02/2018   HCVAB >11.0 (H) 11/01/2016   LIPASE 45 02/20/2017                        Electrolytes Lab Results  Component Value Date   NA 136 04/02/2018   K 4.7 04/02/2018   CL 102 04/02/2018   CALCIUM 9.4 04/02/2018   MG 2.1 09/11/2017                        Neuropathy Markers Lab Results  Component Value Date   VITAMINB12 457 09/11/2017   HGBA1C 5.2 12/28/2016                        CNS Tests No results found for: COLORCSF, APPEARCSF, RBCCOUNTCSF, WBCCSF, POLYSCSF, LYMPHSCSF, EOSCSF, PROTEINCSF, GLUCCSF, JCVIRUS, CSFOLI, IGGCSF                      Bone Pathology Markers Lab Results  Component Value Date   25OHVITD1 35 09/11/2017   25OHVITD2 <1.0 09/11/2017   25OHVITD3 35 09/11/2017                         Coagulation Parameters Lab Results  Component Value Date   INR 1.02 11/01/2016   LABPROT 13.4 11/01/2016   PLT 186 04/02/2018                        Cardiovascular Markers Lab  Results  Component Value Date   BNP 12.0 12/26/2016   TROPONINI <0.03 02/20/2017   HGB 14.7 04/02/2018   HCT 41.9 04/02/2018                         CA Markers No results found for: CEA, CA125, LABCA2                      Note: Lab results reviewed.  Recent Diagnostic  Imaging Results  DG Knee Complete 4 Views Right CLINICAL DATA:  Fall  EXAM: RIGHT KNEE - COMPLETE 4+ VIEW  COMPARISON:  03/04/2017  FINDINGS: No acute displaced fracture or malalignment. Mild patellofemoral degenerative change. Mild to moderate medial compartment degenerative change and mild lateral compartment degenerative change. Small to moderate knee effusion.  IMPRESSION: 1. No acute osseous abnormality. 2. Knee effusion  Electronically Signed   By: Donavan Foil M.D.   On: 10/27/2017 03:29 DG Lumbar Spine Complete CLINICAL DATA:  Fall  EXAM: LUMBAR SPINE - COMPLETE 4+ VIEW  COMPARISON:  08/14/2017, 01/19/2017  FINDINGS: Stable lumbar alignment. Status post posterior rod and fixating screws L4 through S1 with interbody devices at L4-L5 and L5-S1. Chronic superior and anterior endplate deformity at L3 with limbus vertebra. Mild degenerative changes at L1-L2. Vertebral body heights are maintained.  IMPRESSION: 1. No acute osseous abnormality 2. Postsurgical changes L4 through S1.  Electronically Signed   By: Donavan Foil M.D.   On: 10/27/2017 03:25 DG Cervical Spine Complete CLINICAL DATA:  Fall  EXAM: CERVICAL SPINE - COMPLETE 4+ VIEW  COMPARISON:  09/19/2017  FINDINGS: Dens and lateral masses are within normal limits. Cervical alignment within normal limits. Moderate degenerative changes at C5-C6 and C6-C7. Bilateral foraminal narrowing at these levels. Prevertebral soft tissue thickness is normal  IMPRESSION: No radiographic evidence for acute osseous abnormality. Moderate degenerative changes at C5-C6 and C6-C7  Electronically Signed   By: Donavan Foil M.D.   On:  10/27/2017 03:22  Complexity Note: Imaging results reviewed. Results shared with Mr. Maglione, using Layman's terms.                         Meds   Current Outpatient Medications:  .  acetaminophen (TYLENOL) 500 MG tablet, Take 2 tablets (1,000 mg total) by mouth every 6 (six) hours as needed. For pain., Disp: 90 tablet, Rfl: 1 .  aspirin EC 81 MG tablet, Take 1 tablet (81 mg total) by mouth daily., Disp: 30 tablet, Rfl: 1 .  atorvastatin (LIPITOR) 40 MG tablet, Take 1 tablet (40 mg total) by mouth daily at 6 PM., Disp: 30 tablet, Rfl: 1 .  cetirizine (ZYRTEC) 10 MG tablet, Take 10 mg by mouth daily., Disp: , Rfl:  .  DULERA 200-5 MCG/ACT AERO, TAKE 2 PUFFS BY MOUTH TWICE A DAY, Disp: 1 Inhaler, Rfl: 1 .  meloxicam (MOBIC) 7.5 MG tablet, Take 7.5 mg by mouth daily., Disp: , Rfl:  .  nitroGLYCERIN (NITROSTAT) 0.4 MG SL tablet, Place 1 tablet (0.4 mg total) under the tongue every 5 (five) minutes x 3 doses as needed for chest pain., Disp: 30 tablet, Rfl: 0 .  omeprazole (PRILOSEC) 40 MG capsule, Take 40 mg by mouth daily., Disp: , Rfl:  .  prazosin (MINIPRESS) 1 MG capsule, Take 3 capsules (3 mg total) by mouth at bedtime., Disp: 30 capsule, Rfl: 1 .  ranolazine (RANEXA) 500 MG 12 hr tablet, Take 500 mg by mouth 2 (two) times daily., Disp: , Rfl:  .  rOPINIRole (REQUIP) 0.5 MG tablet, TAKE 1 TO 2 TABLETS BY MOUTH TWICE DAILY AS NEEDED FOR RESTLESS LEGS., Disp: , Rfl: 1 .  tamsulosin (FLOMAX) 0.4 MG CAPS capsule, Take 1 capsule (0.4 mg total) by mouth daily., Disp: 30 capsule, Rfl: 1 .  ticagrelor (BRILINTA) 60 MG TABS tablet, Take 60 mg by mouth 2 (two) times daily. , Disp: , Rfl:  .  HYDROcodone-acetaminophen (NORCO) 5-325 MG tablet,  Take 1 tablet by mouth every 6 (six) hours as needed for severe pain. Max.: 4/day. Must Last 30 days., Disp: 120 tablet, Rfl: 0  ROS  Constitutional: Denies any fever or chills Gastrointestinal: No reported hemesis, hematochezia, vomiting, or acute GI  distress Musculoskeletal: Denies any acute onset joint swelling, redness, loss of ROM, or weakness Neurological: No reported episodes of acute onset apraxia, aphasia, dysarthria, agnosia, amnesia, paralysis, loss of coordination, or loss of consciousness  Allergies  Mr. Strayer is allergic to latuda [lurasidone hcl]; naproxen; and saphris [asenapine].  [place]  Drug: Mr. Mccarter  reports that he has current or past drug history. Drugs: Cocaine and Marijuana. Alcohol:  reports that he drinks about 6.0 standard drinks of alcohol per week. Tobacco:  reports that he quit smoking about 5 weeks ago. His smoking use included cigarettes. He has a 16.00 pack-year smoking history. He has never used smokeless tobacco. Medical:  has a past medical history of Anxiety, Arthritis, Bursitis, COPD (chronic obstructive pulmonary disease) ([place]), Depression, GERD (gastroesophageal reflux disease), Hepatitis C (2012), Hypertension, Lung disease, MI (myocardial infarction) ([place]), and Stroke ([place]). Surgical: Mr. Niebuhr  has a past surgical history that includes Appendectomy; Shoulder surgery (Right, 04/09/2012); Back surgery; Abdominal surgery; Colonoscopy; Cardiac catheterization (Left, 06/10/2016); Cardiac catheterization (N/A, 06/10/2016); Esophagogastroduodenoscopy (egd) with propofol (N/A, 01/05/2017); Colonoscopy with propofol (N/A, 01/05/2017); Coronary angioplasty with stent; Knee arthroscopy with medial menisectomy (Right, 09/05/2017); and Spine surgery. Family: family history includes Early death in his father; Heart attack in his father; Heart disease in his father and mother; Hypertension in his father, mother, and sister; Osteoarthritis in his mother.  Constitutional Exam  General appearance: Well nourished, well developed, and well hydrated. In no apparent acute distress Vitals:   04/12/18 0957  BP: 119/76  Pulse: (!) 54  Resp: 16  Temp: 97.7 F (36.5 C)  TempSrc: Oral  SpO2: 100%  Weight: 217 lb (98.4 kg)   Height: 6' 3"  (1.905 m)   BMI Assessment: Estimated body mass index is 27.12 kg/m as calculated from the following:   Height as of this encounter: 6' 3"  (1.905 m).   Weight as of this encounter: 217 lb (98.4 kg). Psych/Mental status: Alert, oriented x 3 (person, place, & time)       Eyes: PERLA Respiratory: No evidence of acute respiratory distress  Cervical Spine Area Exam  Skin & Axial Inspection: No masses, redness, edema, swelling, or associated skin lesions Alignment: Symmetrical Functional ROM: Unrestricted ROM      Stability: No instability detected Muscle Tone/Strength: Functionally intact. No obvious neuro-muscular anomalies detected. Sensory (Neurological): Unimpaired Palpation: No palpable anomalies              Upper Extremity (UE) Exam    Side: Right upper extremity  Side: Left upper extremity  Skin & Extremity Inspection: Skin color, temperature, and hair growth are WNL. No peripheral edema or cyanosis. No masses, redness, swelling, asymmetry, or associated skin lesions. No contractures.  Skin & Extremity Inspection: Skin color, temperature, and hair growth are WNL. No peripheral edema or cyanosis. No masses, redness, swelling, asymmetry, or associated skin lesions. No contractures.  Functional ROM: Unrestricted ROM          Functional ROM: Unrestricted ROM          Muscle Tone/Strength: Functionally intact. No obvious neuro-muscular anomalies detected.  Muscle Tone/Strength: Functionally intact. No obvious neuro-muscular anomalies detected.  Sensory (Neurological): Unimpaired          Sensory (Neurological): Unimpaired  Palpation: No palpable anomalies              Palpation: No palpable anomalies              Provocative Test(s):  Phalen's test: deferred Tinel's test: deferred Apley's scratch test (touch opposite shoulder):  Action 1 (Across chest): deferred Action 2 (Overhead): deferred Action 3 (LB reach): deferred   Provocative Test(s):  Phalen's test:  deferred Tinel's test: deferred Apley's scratch test (touch opposite shoulder):  Action 1 (Across chest): deferred Action 2 (Overhead): deferred Action 3 (LB reach): deferred    Thoracic Spine Area Exam  Skin & Axial Inspection: No masses, redness, or swelling Alignment: Symmetrical Functional ROM: Unrestricted ROM Stability: No instability detected Muscle Tone/Strength: Functionally intact. No obvious neuro-muscular anomalies detected. Sensory (Neurological): Unimpaired Muscle strength & Tone: No palpable anomalies  Lumbar Spine Area Exam  Skin & Axial Inspection: No masses, redness, or swelling Alignment: Symmetrical Functional ROM: Unrestricted ROM       Stability: No instability detected Muscle Tone/Strength: Functionally intact. No obvious neuro-muscular anomalies detected. Sensory (Neurological): Unimpaired Palpation: No palpable anomalies       Provocative Tests: Hyperextension/rotation test: deferred today       Lumbar quadrant test (Kemp's test): deferred today       Lateral bending test: deferred today       Patrick's Maneuver: deferred today                   FABER test: deferred today                   S-I anterior distraction/compression test: deferred today         S-I lateral compression test: deferred today         S-I Thigh-thrust test: deferred today         S-I Gaenslen's test: deferred today          Gait & Posture Assessment  Ambulation: Unassisted Gait: Relatively normal for age and body habitus Posture: WNL   Lower Extremity Exam    Side: Right lower extremity  Side: Left lower extremity  Stability: No instability observed          Stability: No instability observed          Skin & Extremity Inspection: Skin color, temperature, and hair growth are WNL. No peripheral edema or cyanosis. No masses, redness, swelling, asymmetry, or associated skin lesions. No contractures.  Skin & Extremity Inspection: Skin color, temperature, and hair growth are WNL. No  peripheral edema or cyanosis. No masses, redness, swelling, asymmetry, or associated skin lesions. No contractures.  Functional ROM: Unrestricted ROM                  Functional ROM: Unrestricted ROM                  Muscle Tone/Strength: Functionally intact. No obvious neuro-muscular anomalies detected.  Muscle Tone/Strength: Functionally intact. No obvious neuro-muscular anomalies detected.  Sensory (Neurological): Unimpaired  Sensory (Neurological): Unimpaired  Palpation: No palpable anomalies  Palpation: No palpable anomalies   Assessment  Primary Diagnosis & Pertinent Problem List: The primary encounter diagnosis was Chronic knee pain (Primary Area of Pain)  (Bilateral) (R>L). Diagnoses of Spondylosis without myelopathy or radiculopathy, lumbosacral region, Spondylosis without myelopathy or radiculopathy, cervical region, Lumbar facet syndrome (Bilateral), Chronic pain syndrome, and Long term current use of opiate analgesic were also pertinent to this visit.  Status Diagnosis  Controlled Worsening Worsening 1.  Chronic knee pain (Primary Area of Pain)  (Bilateral) (R>L)   2. Spondylosis without myelopathy or radiculopathy, lumbosacral region   3. Spondylosis without myelopathy or radiculopathy, cervical region   4. Lumbar facet syndrome (Bilateral)   5. Chronic pain syndrome   6. Long term current use of opiate analgesic     Problems updated and reviewed during this visit: Problem  Long Term Current Use of Opiate Analgesic   Plan of Care  Pharmacotherapy (Medications Ordered): Meds ordered this encounter  Medications  . HYDROcodone-acetaminophen (NORCO) 5-325 MG tablet    Sig: Take 1 tablet by mouth every 6 (six) hours as needed for severe pain. Max.: 4/day. Must Last 30 days.    Dispense:  120 tablet    Refill:  0    Do not add this medication to the electronic "Automatic Refill" notification system. Patient may have prescription filled one day early if pharmacy is closed on  scheduled refill date.    Order Specific Question:   Supervising Provider    Answer:   Milinda Pointer [[id number]]   New Prescriptions   No medications on file   Medications administered today: Voyd W. Valeriano had no medications administered during this visit. Lab-work, procedure(s), and/or referral(s): Orders Placed This Encounter  Procedures  . LUMBAR FACET(MEDIAL BRANCH NERVE BLOCK) MBNB  . ToxASSURE Select 13 (MW), Urine   Imaging and/or referral(s): None  Interventional management options: Planned, scheduled, and/or pending: Not at this time.   Considering: Diagnostic left intra-articularHyalganknee injectionseries Diagnostic bilateral genicular nerve block Possiblebilateral genicular nerve RFA Diagnostic lumbar facet nerve block Possible bilateral lumbar facet RFA Diagnostic bilateral hip injections Diagnosticright suprascapular nerve block Possible right suprascapular nerve RFA Diagnosticbilateral cervical facet nerve block Possible bilateral cervical RFA   Palliative PRN treatment(s): None at this time    Provider-requested follow-up: Return in about 4 weeks (around 05/10/2018) for MedMgmt with Me Dionisio David), in addition, w/ Dr. Dossie Arbour.  Future Appointments  Date Time Provider [place]  04/19/2018  1:00 PM Milinda Pointer, MD [facility] None  05/07/2018 10:45 AM Vevelyn Francois, NP [facility] None  07/02/2018 10:40 AM Bacigalupo, Dionne Bucy, MD [facility] None   Primary Care Physician: Virginia Crews, MD Location: [facility] Note by: Vevelyn Francois NP Date: 04/12/2018; Time: 11:05 AM  Pain Score Disclaimer: We use the NRS-11 scale. This is a self-reported, subjective measurement of pain severity with only modest accuracy. It is used primarily to identify changes within a particular patient. It must be understood that outpatient pain scales are significantly less accurate that those used for  research, where they can be applied under ideal controlled circumstances with minimal exposure to variables. In reality, the score is likely to be a combination of pain intensity and pain affect, where pain affect describes the degree of emotional arousal or changes in action readiness caused by the sensory experience of pain. Factors such as social and work situation, setting, emotional state, anxiety levels, expectation, and prior pain experience may influence pain perception and show large inter-individual differences that may also be affected by time variables.  Patient instructions provided during this appointment: Patient Instructions   ____________________________________________________________________________________________  Medication Rules  Applies to: All patients receiving prescriptions (written or electronic).  Pharmacy of record: Pharmacy where electronic prescriptions will be sent. If written prescriptions are taken to a different pharmacy, please inform the nursing staff. The pharmacy listed in the electronic medical record should be the one where you would like electronic prescriptions  to be sent.  Prescription refills: Only during scheduled appointments. Applies to both, written and electronic prescriptions.  NOTE: The following applies primarily to controlled substances (Opioid* Pain Medications).   Patient's responsibilities: 1. Pain Pills: Bring all pain pills to every appointment (except for procedure appointments). 2. Pill Bottles: Bring pills in original pharmacy bottle. Always bring newest bottle. Bring bottle, even if empty. 3. Medication refills: You are responsible for knowing and keeping track of what medications you need refilled. The day before your appointment, write a list of all prescriptions that need to be refilled. Bring that list to your appointment and give it to the admitting nurse. Prescriptions will be written only during appointments. If you forget a  medication, it will not be "Called in", "Faxed", or "electronically sent". You will need to get another appointment to get these prescribed. 4. Prescription Accuracy: You are responsible for carefully inspecting your prescriptions before leaving our office. Have the discharge nurse carefully go over each prescription with you, before taking them home. Make sure that your name is accurately spelled, that your address is correct. Check the name and dose of your medication to make sure it is accurate. Check the number of pills, and the written instructions to make sure they are clear and accurate. Make sure that you are given enough medication to last until your next medication refill appointment. 5. Taking Medication: Take medication as prescribed. Never take more pills than instructed. Never take medication more frequently than prescribed. Taking less pills or less frequently is permitted and encouraged, when it comes to controlled substances (written prescriptions).  6. Inform other Doctors: Always inform, all of your healthcare providers, of all the medications you take. 7. Pain Medication from other Providers: You are not allowed to accept any additional pain medication from any other Doctor or Healthcare provider. There are two exceptions to this rule. (see below) In the event that you require additional pain medication, you are responsible for notifying us, as stated below. 8. Medication Agreement: You are responsible for carefully reading and following our Medication Agreement. This must be signed before receiving any prescriptions from our practice. Safely store a copy of your signed Agreement. Violations to the Agreement will result in no further prescriptions. (Additional copies of our Medication Agreement are available upon request.) 9. Laws, Rules, & Regulations: All patients are expected to follow all Federal and [employer], [employer], Rules, [employer]. Ignorance of the Laws does not constitute a  valid excuse. The use of any illegal substances is prohibited. 10. Adopted CDC guidelines & recommendations: Target dosing levels will be at or below 60 MME/day. Use of benzodiazepines** is not recommended.  Exceptions: There are only two exceptions to the rule of not receiving pain medications from other Healthcare Providers. 1. Exception #1 (Emergencies): In the event of an emergency (i.e.: accident requiring emergency care), you are allowed to receive additional pain medication. However, you are responsible for: As soon as you are able, call our office [phone], at any time of the day or night, and leave a message stating your name, the date and nature of the emergency, and the name and dose of the medication prescribed. In the event that your call is answered by a member of our staff, make sure to document and save the date, time, and the name of the person that took your information.  2. Exception #2 (Planned Surgery): In the event that you are scheduled by another doctor or dentist to have any type of  surgery or procedure, you are allowed (for a period no longer than 30 days), to receive additional pain medication, for the acute post-op pain. However, in this case, you are responsible for picking up a copy of our "Post-op Pain Management for Surgeons" handout, and giving it to your surgeon or dentist. This document is available at our office, and does not require an appointment to obtain it. Simply go to our office during business hours (Monday-Thursday from 8:00 AM to 4:00 PM) (Friday 8:00 AM to 12:00 Noon) or if you have a scheduled appointment with [place], prior to your surgery, and ask for it by name. In addition, you will need to provide [place] with your name, name of your surgeon, type of surgery, and date of procedure or surgery.  *Opioid medications include: morphine, codeine, oxycodone, oxymorphone, hydrocodone, hydromorphone, meperidine, tramadol, tapentadol, buprenorphine, fentanyl,  methadone. **Benzodiazepine medications include: diazepam (Valium), alprazolam (Xanax), clonazepam (Klonopine), lorazepam (Ativan), clorazepate (Tranxene), chlordiazepoxide (Librium), estazolam (Prosom), oxazepam (Serax), temazepam (Restoril), triazolam (Halcion) (Last updated: 09/21/2017) ____________________________________________________________________________________________   ____________________________________________________________________________________________  Pain Scale  Introduction: The pain score used by this practice is the Verbal Numerical Rating Scale (VNRS-11). This is an 11-point scale. It is for adults and children 10 years or older. There are significant differences in how the pain score is reported, used, and applied. Forget everything you learned in the past and learn this scoring system.  General Information: The scale should reflect your current level of pain. Unless you are specifically asked for the level of your worst pain, or your average pain. If you are asked for one of these two, then it should be understood that it is over the past 24 hours.  Basic Activities of Daily Living (ADL): Personal hygiene, dressing, eating, transferring, and using restroom.  Instructions: Most patients tend to report their level of pain as a combination of two factors, their physical pain and their psychosocial pain. This last one is also known as "suffering" and it is reflection of how physical pain affects you socially and psychologically. From now on, report them separately. From this point on, when asked to report your pain level, report only your physical pain. Use the following table for reference.  Pain Clinic Pain Levels (0-5/10)  Pain Level Score  Description  No Pain 0   Mild pain 1 Nagging, annoying, but does not interfere with basic activities of daily living (ADL). Patients are able to eat, bathe, get dressed, toileting (being able to get on and off the toilet and  perform personal hygiene functions), transfer (move in and out of bed or a chair without assistance), and maintain continence (able to control bladder and bowel functions). Blood pressure and heart rate are unaffected. A normal heart rate for a healthy adult ranges from 60 to 100 bpm (beats per minute).   Mild to moderate pain 2 Noticeable and distracting. Impossible to hide from other people. More frequent flare-ups. Still possible to adapt and function close to normal. It can be very annoying and may have occasional stronger flare-ups. With discipline, patients may get used to it and adapt.   Moderate pain 3 Interferes significantly with activities of daily living (ADL). It becomes difficult to feed, bathe, get dressed, get on and off the toilet or to perform personal hygiene functions. Difficult to get in and out of bed or a chair without assistance. Very distracting. With effort, it can be ignored when deeply involved in activities.   Moderately severe pain 4 Impossible to ignore for more  than a few minutes. With effort, patients may still be able to manage work or participate in some social activities. Very difficult to concentrate. Signs of autonomic nervous system discharge are evident: dilated pupils (mydriasis); mild sweating (diaphoresis); sleep interference. Heart rate becomes elevated (>115 bpm). Diastolic blood pressure (lower number) rises above 100 mmHg. Patients find relief in laying down and not moving.   Severe pain 5 Intense and extremely unpleasant. Associated with frowning face and frequent crying. Pain overwhelms the senses.  Ability to do any activity or maintain social relationships becomes significantly limited. Conversation becomes difficult. Pacing back and forth is common, as getting into a comfortable position is nearly impossible. Pain wakes you up from deep sleep. Physical signs will be obvious: pupillary dilation; increased sweating; goosebumps; brisk reflexes; cold, clammy  hands and feet; nausea, vomiting or dry heaves; loss of appetite; significant sleep disturbance with inability to fall asleep or to remain asleep. When persistent, significant weight loss is observed due to the complete loss of appetite and sleep deprivation.  Blood pressure and heart rate becomes significantly elevated. Caution: If elevated blood pressure triggers a pounding headache associated with blurred vision, then the patient should immediately seek attention at an urgent or emergency care unit, as these may be signs of an impending stroke.    Emergency Department Pain Levels (6-10/10)  Emergency Room Pain 6 Severely limiting. Requires emergency care and should not be seen or managed at an outpatient pain management facility. Communication becomes difficult and requires great effort. Assistance to reach the emergency department may be required. Facial flushing and profuse sweating along with potentially dangerous increases in heart rate and blood pressure will be evident.   Distressing pain 7 Self-care is very difficult. Assistance is required to transport, or use restroom. Assistance to reach the emergency department will be required. Tasks requiring coordination, such as bathing and getting dressed become very difficult.   Disabling pain 8 Self-care is no longer possible. At this level, pain is disabling. The individual is unable to do even the most "basic" activities such as walking, eating, bathing, dressing, transferring to a bed, or toileting. Fine motor skills are lost. It is difficult to think clearly.   Incapacitating pain 9 Pain becomes incapacitating. Thought processing is no longer possible. Difficult to remember your own name. Control of movement and coordination are lost.   The worst pain imaginable 10 At this level, most patients pass out from pain. When this level is reached, collapse of the autonomic nervous system occurs, leading to a sudden drop in blood pressure and heart  rate. This in turn results in a temporary and dramatic drop in blood flow to the brain, leading to a loss of consciousness. Fainting is one of the body's self defense mechanisms. Passing out puts the brain in a calmed state and causes it to shut down for a while, in order to begin the healing process.    Summary: 1. Refer to this scale when providing [place] with your pain level. 2. Be accurate and careful when reporting your pain level. This will help with your care. 3. Over-reporting your pain level will lead to loss of credibility. 4. Even a level of 1/10 means that there is pain and will be treated at our facility. 5. High, inaccurate reporting will be documented as "Symptom Exaggeration", leading to loss of credibility and suspicions of possible secondary gains such as obtaining more narcotics, or wanting to appear disabled, for fraudulent reasons. 6. Only pain levels of 5  or below will be seen at our facility. 7. Pain levels of 6 and above will be sent to the Emergency Department and the appointment cancelled. ____________________________________________________________________________________________    Moderate Conscious Sedation, Adult Sedation is the use of medicines to promote relaxation and relieve discomfort and anxiety. Moderate conscious sedation is a type of sedation. Under moderate conscious sedation, you are less alert than normal, but you are still able to respond to instructions, touch, or both. Moderate conscious sedation is used during short medical and dental procedures. It is milder than deep sedation, which is a type of sedation under which you cannot be easily woken up. It is also milder than general anesthesia, which is the use of medicines to make you unconscious. Moderate conscious sedation allows you to return to your regular activities sooner. Tell a health care provider about:  Any allergies you have.  All medicines you are taking, including vitamins, herbs, eye  drops, creams, and over-the-counter medicines.  Use of steroids (by mouth or creams).  Any problems you or family members have had with sedatives and anesthetic medicines.  Any blood disorders you have.  Any surgeries you have had.  Any medical conditions you have, such as sleep apnea.  Whether you are pregnant or may be pregnant.  Any use of cigarettes, alcohol, marijuana, or street drugs. What are the risks? Generally, this is a safe procedure. However, problems may occur, including:  Getting too much medicine (oversedation).  Nausea.  Allergic reaction to medicines.  Trouble breathing. If this happens, a breathing tube may be used to help with breathing. It will be removed when you are awake and breathing on your own.  Heart trouble.  Lung trouble.  What happens before the procedure? Staying hydrated Follow instructions from your health care provider about hydration, which may include:  Up to 2 hours before the procedure - you may continue to drink clear liquids, such as water, clear fruit juice, black coffee, and plain tea.  Eating and drinking restrictions Follow instructions from your health care provider about eating and drinking, which may include:  8 hours before the procedure - stop eating heavy meals or foods such as meat, fried foods, or fatty foods.  6 hours before the procedure - stop eating light meals or foods, such as toast or cereal.  6 hours before the procedure - stop drinking milk or drinks that contain milk.  2 hours before the procedure - stop drinking clear liquids.  Medicine  Ask your health care provider about:  Changing or stopping your regular medicines. This is especially important if you are taking diabetes medicines or blood thinners.  Taking medicines such as aspirin and ibuprofen. These medicines can thin your blood. Do not take these medicines before your procedure if your health care provider instructs you not to.  Tests and  exams  You will have a physical exam.  You may have blood tests done to show: ? How well your kidneys and liver are working. ? How well your blood can clot. General instructions  Plan to have someone take you home from the hospital or clinic.  If you will be going home right after the procedure, plan to have someone with you for 24 hours. What happens during the procedure?  An IV tube will be inserted into one of your veins.  Medicine to help you relax (sedative) will be given through the IV tube.  The medical or dental procedure will be performed. What happens after the procedure?  Your blood pressure, heart rate, breathing rate, and blood oxygen level will be monitored often until the medicines you were given have worn off.  Do not drive for 24 hours. This information is not intended to replace advice given to you by your health care provider. Make sure you discuss any questions you have with your health care provider. Document Released: 04/05/2001 Document Revised: 12/15/2015 Document Reviewed: 10/31/2015 Elsevier Interactive Patient Education  2018 [place]  What are the risk, side effects and possible complications? Generally speaking, most procedures are safe.  However, with any procedure there are risks, side effects, and the possibility of complications.  The risks and complications are dependent upon the sites that are lesioned, or the type of nerve block to be performed.  The closer the procedure is to the spine, the more serious the risks are.  Great care is taken when placing the radio frequency needles, block needles or lesioning probes, but sometimes complications can occur. 1. Infection: Any time there is an injection through the skin, there is a risk of infection.  This is why sterile conditions are used for these blocks.  There are four possible types of infection. 1. Localized skin infection. 2. Central Nervous System  Infection-This can be in the form of Meningitis, which can be deadly. 3. Epidural Infections-This can be in the form of an epidural abscess, which can cause pressure inside of the spine, causing compression of the spinal cord with subsequent paralysis. This would require an emergency surgery to decompress, and there are no guarantees that the patient would recover from the paralysis. 4. Discitis-This is an infection of the intervertebral discs.  It occurs in about 1% of discography procedures.  It is difficult to treat and it may lead to surgery.        2. Pain: the needles have to go through skin and soft tissues, will cause soreness.       3. Damage to internal structures:  The nerves to be lesioned may be near blood vessels or    other nerves which can be potentially damaged.       4. Bleeding: Bleeding is more common if the patient is taking blood thinners such as  aspirin, Coumadin, Ticiid, Plavix, etc., or if he/she have some genetic predisposition  such as hemophilia. Bleeding into the spinal canal can cause compression of the spinal  cord with subsequent paralysis.  This would require an emergency surgery to  decompress and there are no guarantees that the patient would recover from the  paralysis.       5. Pneumothorax:  Puncturing of a lung is a possibility, every time a needle is introduced in  the area of the chest or upper back.  Pneumothorax refers to free air around the  collapsed lung(s), inside of the thoracic cavity (chest cavity).  Another two possible  complications related to a similar event would include: Hemothorax and Chylothorax.   These are variations of the Pneumothorax, where instead of air around the collapsed  lung(s), you may have blood or chyle, respectively.       6. Spinal headaches: They may occur with any procedures in the area of the spine.       7. Persistent CSF (Cerebro-Spinal Fluid) leakage: This is a rare problem, but may occur  with prolonged intrathecal or  epidural catheters either due to the formation of a fistulous  track or a dural tear.  8. Nerve damage: By working so close to the spinal cord, there is always a possibility of  nerve damage, which could be as serious as a permanent spinal cord injury with  paralysis.       9. Death:  Although rare, severe deadly allergic reactions known as "Anaphylactic  reaction" can occur to any of the medications used.      10. Worsening of the symptoms:  We can always make thing worse.  What are the chances of something like this happening? Chances of any of this occuring are extremely low.  By statistics, you have more of a chance of getting killed in a motor vehicle accident: while driving to the hospital than any of the above occurring .  Nevertheless, you should be aware that they are possibilities.  In general, it is similar to taking a shower.  Everybody knows that you can slip, hit your head and get killed.  Does that mean that you should not shower again?  Nevertheless always keep in mind that statistics do not mean anything if you happen to be on the wrong side of them.  Even if a procedure has a 1 (one) in a 1,000,000 (million) chance of going wrong, it you happen to be that one..Also, keep in mind that by statistics, you have more of a chance of having something go wrong when taking medications.  Who should not have this procedure? If you are on a blood thinning medication (e.g. Coumadin, Plavix, see list of "Blood Thinners"), or if you have an active infection going on, you should not have the procedure.  If you are taking any blood thinners, please inform your physician.  How should I prepare for this procedure?  Do not eat or drink anything at least six hours prior to the procedure.  Bring a driver with you .  It cannot be a taxi.  Come accompanied by an adult that can drive you back, and that is strong enough to help you if your legs get weak or numb from the local anesthetic.  Take all of  your medicines the morning of the procedure with just enough water to swallow them.  If you have diabetes, make sure that you are scheduled to have your procedure done first thing in the morning, whenever possible.  If you have diabetes, take only half of your insulin dose and notify our nurse that you have done so as soon as you arrive at the clinic.  If you are diabetic, but only take blood sugar pills (oral hypoglycemic), then do not take them on the morning of your procedure.  You may take them after you have had the procedure.  Do not take aspirin or any aspirin-containing medications, at least eleven (11) days prior to the procedure.  They may prolong bleeding.  Wear loose fitting clothing that may be easy to take off and that you would not mind if it got stained with Betadine or blood.  Do not wear any jewelry or perfume  Remove any nail coloring.  It will interfere with some of our monitoring equipment.  NOTE: Remember that this is not meant to be interpreted as a complete list of all possible complications.  Unforeseen problems may occur.  BLOOD THINNERS The following drugs contain aspirin or other products, which can cause increased bleeding during surgery and should not be taken for 2 weeks prior to and 1 week after surgery.  If you should need take something for relief of minor pain,  you may take acetaminophen which is found in Tylenol,m Datril, Anacin-3 and Panadol. It is not blood thinner. The products listed below are.  Do not take any of the products listed below in addition to any listed on your instruction sheet.  A.P.C or A.P.C with Codeine Codeine Phosphate Capsules #3 Ibuprofen Ridaura  ABC compound Congesprin Imuran rimadil  Advil Cope Indocin Robaxisal  Alka-Seltzer Effervescent Pain Reliever and Antacid Coricidin or Coricidin-D  Indomethacin Rufen  Alka-Seltzer plus Cold Medicine Cosprin Ketoprofen S-A-C Tablets  Anacin Analgesic Tablets or Capsules Coumadin  Korlgesic Salflex  Anacin Extra Strength Analgesic tablets or capsules CP-2 Tablets Lanoril Salicylate  Anaprox Cuprimine Capsules Levenox Salocol  Anexsia-D Dalteparin Magan Salsalate  Anodynos Darvon compound Magnesium Salicylate Sine-off  Ansaid Dasin Capsules Magsal Sodium Salicylate  Anturane Depen Capsules Marnal Soma  APF Arthritis pain formula Dewitt's Pills Measurin Stanback  Argesic Dia-Gesic Meclofenamic Sulfinpyrazone  Arthritis Bayer Timed Release Aspirin Diclofenac Meclomen Sulindac  Arthritis pain formula Anacin Dicumarol Medipren Supac  Analgesic (Safety coated) Arthralgen Diffunasal Mefanamic Suprofen  Arthritis Strength Bufferin Dihydrocodeine Mepro Compound Suprol  Arthropan liquid Dopirydamole Methcarbomol with Aspirin Synalgos  ASA tablets/Enseals Disalcid Micrainin Tagament  Ascriptin Doan's Midol Talwin  Ascriptin A/D Dolene Mobidin Tanderil  Ascriptin Extra Strength Dolobid Moblgesic Ticlid  Ascriptin with Codeine Doloprin or Doloprin with Codeine Momentum Tolectin  Asperbuf Duoprin Mono-gesic Trendar  Aspergum Duradyne Motrin or Motrin IB Triminicin  Aspirin plain, buffered or enteric coated Durasal Myochrisine Trigesic  Aspirin Suppositories Easprin Nalfon Trillsate  Aspirin with Codeine Ecotrin Regular or Extra Strength Naprosyn Uracel  Atromid-S Efficin Naproxen Ursinus  Auranofin Capsules Elmiron Neocylate Vanquish  Axotal Emagrin Norgesic Verin  Azathioprine Empirin or Empirin with Codeine Normiflo Vitamin E  Azolid Emprazil Nuprin Voltaren  Bayer Aspirin plain, buffered or children's or timed BC Tablets or powders Encaprin Orgaran Warfarin Sodium  Buff-a-Comp Enoxaparin Orudis Zorpin  Buff-a-Comp with Codeine Equegesic Os-Cal-Gesic   Buffaprin Excedrin plain, buffered or Extra Strength Oxalid   Bufferin Arthritis Strength Feldene Oxphenbutazone   Bufferin plain or Extra Strength Feldene Capsules Oxycodone with Aspirin   Bufferin with Codeine  Fenoprofen Fenoprofen Pabalate or Pabalate-SF   Buffets II Flogesic Panagesic   Buffinol plain or Extra Strength Florinal or Florinal with Codeine Panwarfarin   Buf-Tabs Flurbiprofen Penicillamine   Butalbital Compound Four-way cold tablets Penicillin   Butazolidin Fragmin Pepto-Bismol   Carbenicillin Geminisyn Percodan   Carna Arthritis Reliever Geopen Persantine   Carprofen Gold's salt Persistin   Chloramphenicol Goody's Phenylbutazone   Chloromycetin Haltrain Piroxlcam   Clmetidine heparin Plaquenil   Cllnoril Hyco-pap Ponstel   Clofibrate Hydroxy chloroquine Propoxyphen         Before stopping any of these medications, be sure to consult the physician who ordered them.  Some, such as Coumadin (Warfarin) are ordered to prevent or treat serious conditions such as "deep thrombosis", "pumonary embolisms", and other heart problems.  The amount of time that you may need off of the medication may also vary with the medication and the reason for which you were taking it.  If you are taking any of these medications, please make sure you notify your pain physician before you undergo any procedures.         Facet Blocks Patient Information  Description: The facets are joints in the spine between the vertebrae.  Like any joints in the body, facets can become irritated and painful.  Arthritis can also effect the facets.  By injecting steroids and local anesthetic in and  around these joints, we can temporarily block the nerve supply to them.  Steroids act directly on irritated nerves and tissues to reduce selling and inflammation which often leads to decreased pain.  Facet blocks may be done anywhere along the spine from the neck to the low back depending upon the location of your pain.   After numbing the skin with local anesthetic (like Novocaine), a small needle is passed onto the facet joints under x-ray guidance.  You may experience a sensation of pressure while this is being done.  The  entire block usually lasts about 15-25 minutes.   Conditions which may be treated by facet blocks:   Low back/buttock pain  Neck/shoulder pain  Certain types of headaches  Preparation for the injection:  1. Do not eat any solid food or dairy products within 8 hours of your appointment. 2. You may drink clear liquid up to 3 hours before appointment.  Clear liquids include water, black coffee, juice or soda.  No milk or cream please. 3. You may take your regular medication, including pain medications, with a sip of water before your appointment.  Diabetics should hold regular insulin (if taken separately) and take 1/2 normal NPH dose the morning of the procedure.  Carry some sugar containing items with you to your appointment. 4. A driver must accompany you and be prepared to drive you home after your procedure. 5. Bring all your current medications with you. 6. An IV may be inserted and sedation may be given at the discretion of the physician. 7. A blood pressure cuff, EKG and other monitors will often be applied during the procedure.  Some patients may need to have extra oxygen administered for a short period. 8. You will be asked to provide medical information, including your allergies and medications, prior to the procedure.  We must know immediately if you are taking blood thinners (like Coumadin/Warfarin) or if you are allergic to IV iodine contrast (dye).  We must know if you could possible be pregnant.  Possible side-effects:   Bleeding from needle site  Infection (rare, may require surgery)  Nerve injury (rare)  Numbness & tingling (temporary)  Difficulty urinating (rare, temporary)  Spinal headache (a headache worse with upright posture)  Light-headedness (temporary)  Pain at injection site (serveral days)  Decreased blood pressure (rare, temporary)  Weakness in arm/leg (temporary)  Pressure sensation in back/neck (temporary)   Call if you  experience:   Fever/chills associated with headache or increased back/neck pain  Headache worsened by an upright position  New onset, weakness or numbness of an extremity below the injection site  Hives or difficulty breathing (go to the emergency room)  Inflammation or drainage at the injection site(s)  Severe back/neck pain greater than usual  New symptoms which are concerning to you  Please note:  Although the local anesthetic injected can often make your back or neck feel good for several hours after the injection, the pain will likely return. It takes 3-7 days for steroids to work.  You may not notice any pain relief for at least one week.  If effective, we will often do a series of 2-3 injections spaced 3-6 weeks apart to maximally decrease your pain.  After the initial series, you may be a candidate for a more permanent nerve block of the facets.  If you have any questions, please call #[phone] [facility]

## 2018-04-12 NOTE — Progress Notes (Signed)
Nursing Pain Medication Assessment:  Safety precautions to be maintained throughout the outpatient stay will include: orient to surroundings, keep bed in low position, maintain call bell within reach at all times, provide assistance with transfer out of bed and ambulation.  Medication Inspection Compliance: Mr. Petersen did not comply with our request to bring his pills to be counted. He was reminded that bringing the medication bottles, even when empty, is a requirement.  Medication: None brought in. Pill/Patch Count: None available to be counted. Bottle Appearance: No container available. Did not bring bottle(s) to appointment. Filled Date: N/A Last Medication intake:  Today

## 2018-04-12 NOTE — Patient Instructions (Addendum)
____________________________________________________________________________________________  Medication Rules  Applies to: All patients receiving prescriptions (written or electronic).  Pharmacy of record: Pharmacy where electronic prescriptions will be sent. If written prescriptions are taken to a different pharmacy, please inform the nursing staff. The pharmacy listed in the electronic medical record should be the one where you would like electronic prescriptions to be sent.  Prescription refills: Only during scheduled appointments. Applies to both, written and electronic prescriptions.  NOTE: The following applies primarily to controlled substances (Opioid* Pain Medications).   Patient's responsibilities: 1. Pain Pills: Bring all pain pills to every appointment (except for procedure appointments). 2. Pill Bottles: Bring pills in original pharmacy bottle. Always bring newest bottle. Bring bottle, even if empty. 3. Medication refills: You are responsible for knowing and keeping track of what medications you need refilled. The day before your appointment, write a list of all prescriptions that need to be refilled. Bring that list to your appointment and give it to the admitting nurse. Prescriptions will be written only during appointments. If you forget a medication, it will not be "Called in", "Faxed", or "electronically sent". You will need to get another appointment to get these prescribed. 4. Prescription Accuracy: You are responsible for carefully inspecting your prescriptions before leaving our office. Have the discharge nurse carefully go over each prescription with you, before taking them home. Make sure that your name is accurately spelled, that your address is correct. Check the name and dose of your medication to make sure it is accurate. Check the number of pills, and the written instructions to make sure they are clear and accurate. Make sure that you are given enough medication to last  until your next medication refill appointment. 5. Taking Medication: Take medication as prescribed. Never take more pills than instructed. Never take medication more frequently than prescribed. Taking less pills or less frequently is permitted and encouraged, when it comes to controlled substances (written prescriptions).  6. Inform other Doctors: Always inform, all of your healthcare providers, of all the medications you take. 7. Pain Medication from other Providers: You are not allowed to accept any additional pain medication from any other Doctor or Healthcare provider. There are two exceptions to this rule. (see below) In the event that you require additional pain medication, you are responsible for notifying us, as stated below. 8. Medication Agreement: You are responsible for carefully reading and following our Medication Agreement. This must be signed before receiving any prescriptions from our practice. Safely store a copy of your signed Agreement. Violations to the Agreement will result in no further prescriptions. (Additional copies of our Medication Agreement are available upon request.) 9. Laws, Rules, & Regulations: All patients are expected to follow all Federal and State Laws, Statutes, Rules, & Regulations. Ignorance of the Laws does not constitute a valid excuse. The use of any illegal substances is prohibited. 10. Adopted CDC guidelines & recommendations: Target dosing levels will be at or below 60 MME/day. Use of benzodiazepines** is not recommended.  Exceptions: There are only two exceptions to the rule of not receiving pain medications from other Healthcare Providers. 1. Exception #1 (Emergencies): In the event of an emergency (i.e.: accident requiring emergency care), you are allowed to receive additional pain medication. However, you are responsible for: As soon as you are able, call our office [phone], at any time of the day or night, and leave a message stating your name, the  date and nature of the emergency, and the name and dose of the medication   prescribed. In the event that your call is answered by a member of our staff, make sure to document and save the date, time, and the name of the person that took your information.  2. Exception #2 (Planned Surgery): In the event that you are scheduled by another doctor or dentist to have any type of surgery or procedure, you are allowed (for a period no longer than 30 days), to receive additional pain medication, for the acute post-op pain. However, in this case, you are responsible for picking up a copy of our "Post-op Pain Management for Surgeons" handout, and giving it to your surgeon or dentist. This document is available at our office, and does not require an appointment to obtain it. Simply go to our office during business hours (Monday-Thursday from 8:00 AM to 4:00 PM) (Friday 8:00 AM to 12:00 Noon) or if you have a scheduled appointment with us, prior to your surgery, and ask for it by name. In addition, you will need to provide us with your name, name of your surgeon, type of surgery, and date of procedure or surgery.  *Opioid medications include: morphine, codeine, oxycodone, oxymorphone, hydrocodone, hydromorphone, meperidine, tramadol, tapentadol, buprenorphine, fentanyl, methadone. **Benzodiazepine medications include: diazepam (Valium), alprazolam (Xanax), clonazepam (Klonopine), lorazepam (Ativan), clorazepate (Tranxene), chlordiazepoxide (Librium), estazolam (Prosom), oxazepam (Serax), temazepam (Restoril), triazolam (Halcion) (Last updated: 09/21/2017) ____________________________________________________________________________________________   ____________________________________________________________________________________________  Pain Scale  Introduction: The pain score used by this practice is the Verbal Numerical Rating Scale (VNRS-11). This is an 11-point scale. It is for adults and children 10 years or  older. There are significant differences in how the pain score is reported, used, and applied. Forget everything you learned in the past and learn this scoring system.  General Information: The scale should reflect your current level of pain. Unless you are specifically asked for the level of your worst pain, or your average pain. If you are asked for one of these two, then it should be understood that it is over the past 24 hours.  Basic Activities of Daily Living (ADL): Personal hygiene, dressing, eating, transferring, and using restroom.  Instructions: Most patients tend to report their level of pain as a combination of two factors, their physical pain and their psychosocial pain. This last one is also known as "suffering" and it is reflection of how physical pain affects you socially and psychologically. From now on, report them separately. From this point on, when asked to report your pain level, report only your physical pain. Use the following table for reference.  Pain Clinic Pain Levels (0-5/10)  Pain Level Score  Description  No Pain 0   Mild pain 1 Nagging, annoying, but does not interfere with basic activities of daily living (ADL). Patients are able to eat, bathe, get dressed, toileting (being able to get on and off the toilet and perform personal hygiene functions), transfer (move in and out of bed or a chair without assistance), and maintain continence (able to control bladder and bowel functions). Blood pressure and heart rate are unaffected. A normal heart rate for a healthy adult ranges from 60 to 100 bpm (beats per minute).   Mild to moderate pain 2 Noticeable and distracting. Impossible to hide from other people. More frequent flare-ups. Still possible to adapt and function close to normal. It can be very annoying and may have occasional stronger flare-ups. With discipline, patients may get used to it and adapt.   Moderate pain 3 Interferes significantly with activities of daily  living (ADL).   It becomes difficult to feed, bathe, get dressed, get on and off the toilet or to perform personal hygiene functions. Difficult to get in and out of bed or a chair without assistance. Very distracting. With effort, it can be ignored when deeply involved in activities.   Moderately severe pain 4 Impossible to ignore for more than a few minutes. With effort, patients may still be able to manage work or participate in some social activities. Very difficult to concentrate. Signs of autonomic nervous system discharge are evident: dilated pupils (mydriasis); mild sweating (diaphoresis); sleep interference. Heart rate becomes elevated (>115 bpm). Diastolic blood pressure (lower number) rises above 100 mmHg. Patients find relief in laying down and not moving.   Severe pain 5 Intense and extremely unpleasant. Associated with frowning face and frequent crying. Pain overwhelms the senses.  Ability to do any activity or maintain social relationships becomes significantly limited. Conversation becomes difficult. Pacing back and forth is common, as getting into a comfortable position is nearly impossible. Pain wakes you up from deep sleep. Physical signs will be obvious: pupillary dilation; increased sweating; goosebumps; brisk reflexes; cold, clammy hands and feet; nausea, vomiting or dry heaves; loss of appetite; significant sleep disturbance with inability to fall asleep or to remain asleep. When persistent, significant weight loss is observed due to the complete loss of appetite and sleep deprivation.  Blood pressure and heart rate becomes significantly elevated. Caution: If elevated blood pressure triggers a pounding headache associated with blurred vision, then the patient should immediately seek attention at an urgent or emergency care unit, as these may be signs of an impending stroke.    Emergency Department Pain Levels (6-10/10)  Emergency Room Pain 6 Severely limiting. Requires emergency care  and should not be seen or managed at an outpatient pain management facility. Communication becomes difficult and requires great effort. Assistance to reach the emergency department may be required. Facial flushing and profuse sweating along with potentially dangerous increases in heart rate and blood pressure will be evident.   Distressing pain 7 Self-care is very difficult. Assistance is required to transport, or use restroom. Assistance to reach the emergency department will be required. Tasks requiring coordination, such as bathing and getting dressed become very difficult.   Disabling pain 8 Self-care is no longer possible. At this level, pain is disabling. The individual is unable to do even the most "basic" activities such as walking, eating, bathing, dressing, transferring to a bed, or toileting. Fine motor skills are lost. It is difficult to think clearly.   Incapacitating pain 9 Pain becomes incapacitating. Thought processing is no longer possible. Difficult to remember your own name. Control of movement and coordination are lost.   The worst pain imaginable 10 At this level, most patients pass out from pain. When this level is reached, collapse of the autonomic nervous system occurs, leading to a sudden drop in blood pressure and heart rate. This in turn results in a temporary and dramatic drop in blood flow to the brain, leading to a loss of consciousness. Fainting is one of the body's self defense mechanisms. Passing out puts the brain in a calmed state and causes it to shut down for a while, in order to begin the healing process.    Summary: 1. Refer to this scale when providing us with your pain level. 2. Be accurate and careful when reporting your pain level. This will help with your care. 3. Over-reporting your pain level will lead to loss of credibility. 4. Even   a level of 1/10 means that there is pain and will be treated at our facility. 5. High, inaccurate reporting will be  documented as "Symptom Exaggeration", leading to loss of credibility and suspicions of possible secondary gains such as obtaining more narcotics, or wanting to appear disabled, for fraudulent reasons. 6. Only pain levels of 5 or below will be seen at our facility. 7. Pain levels of 6 and above will be sent to the Emergency Department and the appointment cancelled. ____________________________________________________________________________________________    Moderate Conscious Sedation, Adult Sedation is the use of medicines to promote relaxation and relieve discomfort and anxiety. Moderate conscious sedation is a type of sedation. Under moderate conscious sedation, you are less alert than normal, but you are still able to respond to instructions, touch, or both. Moderate conscious sedation is used during short medical and dental procedures. It is milder than deep sedation, which is a type of sedation under which you cannot be easily woken up. It is also milder than general anesthesia, which is the use of medicines to make you unconscious. Moderate conscious sedation allows you to return to your regular activities sooner. Tell a health care provider about:  Any allergies you have.  All medicines you are taking, including vitamins, herbs, eye drops, creams, and over-the-counter medicines.  Use of steroids (by mouth or creams).  Any problems you or family members have had with sedatives and anesthetic medicines.  Any blood disorders you have.  Any surgeries you have had.  Any medical conditions you have, such as sleep apnea.  Whether you are pregnant or may be pregnant.  Any use of cigarettes, alcohol, marijuana, or street drugs. What are the risks? Generally, this is a safe procedure. However, problems may occur, including:  Getting too much medicine (oversedation).  Nausea.  Allergic reaction to medicines.  Trouble breathing. If this happens, a breathing tube may be used to help  with breathing. It will be removed when you are awake and breathing on your own.  Heart trouble.  Lung trouble.  What happens before the procedure? Staying hydrated Follow instructions from your health care provider about hydration, which may include:  Up to 2 hours before the procedure - you may continue to drink clear liquids, such as water, clear fruit juice, black coffee, and plain tea.  Eating and drinking restrictions Follow instructions from your health care provider about eating and drinking, which may include:  8 hours before the procedure - stop eating heavy meals or foods such as meat, fried foods, or fatty foods.  6 hours before the procedure - stop eating light meals or foods, such as toast or cereal.  6 hours before the procedure - stop drinking milk or drinks that contain milk.  2 hours before the procedure - stop drinking clear liquids.  Medicine  Ask your health care provider about:  Changing or stopping your regular medicines. This is especially important if you are taking diabetes medicines or blood thinners.  Taking medicines such as aspirin and ibuprofen. These medicines can thin your blood. Do not take these medicines before your procedure if your health care provider instructs you not to.  Tests and exams  You will have a physical exam.  You may have blood tests done to show: ? How well your kidneys and liver are working. ? How well your blood can clot. General instructions  Plan to have someone take you home from the hospital or clinic.  If you will be going home right after the  procedure, plan to have someone with you for 24 hours. What happens during the procedure?  An IV tube will be inserted into one of your veins.  Medicine to help you relax (sedative) will be given through the IV tube.  The medical or dental procedure will be performed. What happens after the procedure?  Your blood pressure, heart rate, breathing rate, and blood oxygen  level will be monitored often until the medicines you were given have worn off.  Do not drive for 24 hours. This information is not intended to replace advice given to you by your health care provider. Make sure you discuss any questions you have with your health care provider. Document Released: 04/05/2001 Document Revised: 12/15/2015 Document Reviewed: 10/31/2015 Elsevier Interactive Patient Education  2018 [employer]  What are the risk, side effects and possible complications? Generally speaking, most procedures are safe.  However, with any procedure there are risks, side effects, and the possibility of complications.  The risks and complications are dependent upon the sites that are lesioned, or the type of nerve block to be performed.  The closer the procedure is to the spine, the more serious the risks are.  Great care is taken when placing the radio frequency needles, block needles or lesioning probes, but sometimes complications can occur. 1. Infection: Any time there is an injection through the skin, there is a risk of infection.  This is why sterile conditions are used for these blocks.  There are four possible types of infection. 1. Localized skin infection. 2. Central Nervous System Infection-This can be in the form of Meningitis, which can be deadly. 3. Epidural Infections-This can be in the form of an epidural abscess, which can cause pressure inside of the spine, causing compression of the spinal cord with subsequent paralysis. This would require an emergency surgery to decompress, and there are no guarantees that the patient would recover from the paralysis. 4. Discitis-This is an infection of the intervertebral discs.  It occurs in about 1% of discography procedures.  It is difficult to treat and it may lead to surgery.        2. Pain: the needles have to go through skin and soft tissues, will cause soreness.       3. Damage to internal  structures:  The nerves to be lesioned may be near blood vessels or    other nerves which can be potentially damaged.       4. Bleeding: Bleeding is more common if the patient is taking blood thinners such as  aspirin, Coumadin, Ticiid, Plavix, etc., or if he/she have some genetic predisposition  such as hemophilia. Bleeding into the spinal canal can cause compression of the spinal  cord with subsequent paralysis.  This would require an emergency surgery to  decompress and there are no guarantees that the patient would recover from the  paralysis.       5. Pneumothorax:  Puncturing of a lung is a possibility, every time a needle is introduced in  the area of the chest or upper back.  Pneumothorax refers to free air around the  collapsed lung(s), inside of the thoracic cavity (chest cavity).  Another two possible  complications related to a similar event would include: Hemothorax and Chylothorax.   These are variations of the Pneumothorax, where instead of air around the collapsed  lung(s), you may have blood or chyle, respectively.       6. Spinal headaches: They may occur  with any procedures in the area of the spine.       7. Persistent CSF (Cerebro-Spinal Fluid) leakage: This is a rare problem, but may occur  with prolonged intrathecal or epidural catheters either due to the formation of a fistulous  track or a dural tear.       8. Nerve damage: By working so close to the spinal cord, there is always a possibility of  nerve damage, which could be as serious as a permanent spinal cord injury with  paralysis.       9. Death:  Although rare, severe deadly allergic reactions known as "Anaphylactic  reaction" can occur to any of the medications used.      10. Worsening of the symptoms:  We can always make thing worse.  What are the chances of something like this happening? Chances of any of this occuring are extremely low.  By statistics, you have more of a chance of getting killed in a motor vehicle  accident: while driving to the hospital than any of the above occurring .  Nevertheless, you should be aware that they are possibilities.  In general, it is similar to taking a shower.  Everybody knows that you can slip, hit your head and get killed.  Does that mean that you should not shower again?  Nevertheless always keep in mind that statistics do not mean anything if you happen to be on the wrong side of them.  Even if a procedure has a 1 (one) in a 1,000,000 (million) chance of going wrong, it you happen to be that one..Also, keep in mind that by statistics, you have more of a chance of having something go wrong when taking medications.  Who should not have this procedure? If you are on a blood thinning medication (e.g. Coumadin, Plavix, see list of "Blood Thinners"), or if you have an active infection going on, you should not have the procedure.  If you are taking any blood thinners, please inform your physician.  How should I prepare for this procedure?  Do not eat or drink anything at least six hours prior to the procedure.  Bring a driver with you .  It cannot be a taxi.  Come accompanied by an adult that can drive you back, and that is strong enough to help you if your legs get weak or numb from the local anesthetic.  Take all of your medicines the morning of the procedure with just enough water to swallow them.  If you have diabetes, make sure that you are scheduled to have your procedure done first thing in the morning, whenever possible.  If you have diabetes, take only half of your insulin dose and notify our nurse that you have done so as soon as you arrive at the clinic.  If you are diabetic, but only take blood sugar pills (oral hypoglycemic), then do not take them on the morning of your procedure.  You may take them after you have had the procedure.  Do not take aspirin or any aspirin-containing medications, at least eleven (11) days prior to the procedure.  They may prolong  bleeding.  Wear loose fitting clothing that may be easy to take off and that you would not mind if it got stained with Betadine or blood.  Do not wear any jewelry or perfume  Remove any nail coloring.  It will interfere with some of our monitoring equipment.  NOTE: Remember that this is not meant to be interpreted as  a complete list of all possible complications.  Unforeseen problems may occur.  BLOOD THINNERS The following drugs contain aspirin or other products, which can cause increased bleeding during surgery and should not be taken for 2 weeks prior to and 1 week after surgery.  If you should need take something for relief of minor pain, you may take acetaminophen which is found in Tylenol,m Datril, Anacin-3 and Panadol. It is not blood thinner. The products listed below are.  Do not take any of the products listed below in addition to any listed on your instruction sheet.  A.P.C or A.P.C with Codeine Codeine Phosphate Capsules #3 Ibuprofen Ridaura  ABC compound Congesprin Imuran rimadil  Advil Cope Indocin Robaxisal  Alka-Seltzer Effervescent Pain Reliever and Antacid Coricidin or Coricidin-D  Indomethacin Rufen  Alka-Seltzer plus Cold Medicine Cosprin Ketoprofen S-A-C Tablets  Anacin Analgesic Tablets or Capsules Coumadin Korlgesic Salflex  Anacin Extra Strength Analgesic tablets or capsules CP-2 Tablets Lanoril Salicylate  Anaprox Cuprimine Capsules Levenox Salocol  Anexsia-D Dalteparin Magan Salsalate  Anodynos Darvon compound Magnesium Salicylate Sine-off  Ansaid Dasin Capsules Magsal Sodium Salicylate  Anturane Depen Capsules Marnal Soma  APF Arthritis pain formula Dewitt's Pills Measurin Stanback  Argesic Dia-Gesic Meclofenamic Sulfinpyrazone  Arthritis Bayer Timed Release Aspirin Diclofenac Meclomen Sulindac  Arthritis pain formula Anacin Dicumarol Medipren Supac  Analgesic (Safety coated) Arthralgen Diffunasal Mefanamic Suprofen  Arthritis Strength Bufferin Dihydrocodeine  Mepro Compound Suprol  Arthropan liquid Dopirydamole Methcarbomol with Aspirin Synalgos  ASA tablets/Enseals Disalcid Micrainin Tagament  Ascriptin Doan's Midol Talwin  Ascriptin A/D Dolene Mobidin Tanderil  Ascriptin Extra Strength Dolobid Moblgesic Ticlid  Ascriptin with Codeine Doloprin or Doloprin with Codeine Momentum Tolectin  Asperbuf Duoprin Mono-gesic Trendar  Aspergum Duradyne Motrin or Motrin IB Triminicin  Aspirin plain, buffered or enteric coated Durasal Myochrisine Trigesic  Aspirin Suppositories Easprin Nalfon Trillsate  Aspirin with Codeine Ecotrin Regular or Extra Strength Naprosyn Uracel  Atromid-S Efficin Naproxen Ursinus  Auranofin Capsules Elmiron Neocylate Vanquish  Axotal Emagrin Norgesic Verin  Azathioprine Empirin or Empirin with Codeine Normiflo Vitamin E  Azolid Emprazil Nuprin Voltaren  Bayer Aspirin plain, buffered or children's or timed BC Tablets or powders Encaprin Orgaran Warfarin Sodium  Buff-a-Comp Enoxaparin Orudis Zorpin  Buff-a-Comp with Codeine Equegesic Os-Cal-Gesic   Buffaprin Excedrin plain, buffered or Extra Strength Oxalid   Bufferin Arthritis Strength Feldene Oxphenbutazone   Bufferin plain or Extra Strength Feldene Capsules Oxycodone with Aspirin   Bufferin with Codeine Fenoprofen Fenoprofen Pabalate or Pabalate-SF   Buffets II Flogesic Panagesic   Buffinol plain or Extra Strength Florinal or Florinal with Codeine Panwarfarin   Buf-Tabs Flurbiprofen Penicillamine   Butalbital Compound Four-way cold tablets Penicillin   Butazolidin Fragmin Pepto-Bismol   Carbenicillin Geminisyn Percodan   Carna Arthritis Reliever Geopen Persantine   Carprofen Gold's salt Persistin   Chloramphenicol Goody's Phenylbutazone   Chloromycetin Haltrain Piroxlcam   Clmetidine heparin Plaquenil   Cllnoril Hyco-pap Ponstel   Clofibrate Hydroxy chloroquine Propoxyphen         Before stopping any of these medications, be sure to consult the physician who ordered  them.  Some, such as Coumadin (Warfarin) are ordered to prevent or treat serious conditions such as "deep thrombosis", "pumonary embolisms", and other heart problems.  The amount of time that you may need off of the medication may also vary with the medication and the reason for which you were taking it.  If you are taking any of these medications, please make sure you notify your pain physician before  you undergo any procedures.         Facet Blocks Patient Information  Description: The facets are joints in the spine between the vertebrae.  Like any joints in the body, facets can become irritated and painful.  Arthritis can also effect the facets.  By injecting steroids and local anesthetic in and around these joints, we can temporarily block the nerve supply to them.  Steroids act directly on irritated nerves and tissues to reduce selling and inflammation which often leads to decreased pain.  Facet blocks may be done anywhere along the spine from the neck to the low back depending upon the location of your pain.   After numbing the skin with local anesthetic (like Novocaine), a small needle is passed onto the facet joints under x-ray guidance.  You may experience a sensation of pressure while this is being done.  The entire block usually lasts about 15-25 minutes.   Conditions which may be treated by facet blocks:   Low back/buttock pain  Neck/shoulder pain  Certain types of headaches  Preparation for the injection:  1. Do not eat any solid food or dairy products within 8 hours of your appointment. 2. You may drink clear liquid up to 3 hours before appointment.  Clear liquids include water, black coffee, juice or soda.  No milk or cream please. 3. You may take your regular medication, including pain medications, with a sip of water before your appointment.  Diabetics should hold regular insulin (if taken separately) and take 1/2 normal NPH dose the morning of the procedure.  Carry some  sugar containing items with you to your appointment. 4. A driver must accompany you and be prepared to drive you home after your procedure. 5. Bring all your current medications with you. 6. An IV may be inserted and sedation may be given at the discretion of the physician. 7. A blood pressure cuff, EKG and other monitors will often be applied during the procedure.  Some patients may need to have extra oxygen administered for a short period. 8. You will be asked to provide medical information, including your allergies and medications, prior to the procedure.  We must know immediately if you are taking blood thinners (like Coumadin/Warfarin) or if you are allergic to IV iodine contrast (dye).  We must know if you could possible be pregnant.  Possible side-effects:   Bleeding from needle site  Infection (rare, may require surgery)  Nerve injury (rare)  Numbness & tingling (temporary)  Difficulty urinating (rare, temporary)  Spinal headache (a headache worse with upright posture)  Light-headedness (temporary)  Pain at injection site (serveral days)  Decreased blood pressure (rare, temporary)  Weakness in arm/leg (temporary)  Pressure sensation in back/neck (temporary)   Call if you experience:   Fever/chills associated with headache or increased back/neck pain  Headache worsened by an upright position  New onset, weakness or numbness of an extremity below the injection site  Hives or difficulty breathing (go to the emergency room)  Inflammation or drainage at the injection site(s)  Severe back/neck pain greater than usual  New symptoms which are concerning to you  Please note:  Although the local anesthetic injected can often make your back or neck feel good for several hours after the injection, the pain will likely return. It takes 3-7 days for steroids to work.  You may not notice any pain relief for at least one week.  If effective, we will often do a series of  2-3 injections  spaced 3-6 weeks apart to maximally decrease your pain.  After the initial series, you may be a candidate for a more permanent nerve block of the facets.  If you have any questions, please call #[phone]) [facility]

## 2018-04-18 ENCOUNTER — Encounter: Payer: Self-pay | Admitting: Nurse Practitioner

## 2018-04-18 DIAGNOSIS — F1210 Cannabis abuse, uncomplicated: Secondary | ICD-10-CM | POA: Insufficient documentation

## 2018-04-18 DIAGNOSIS — F1490 Cocaine use, unspecified, uncomplicated: Secondary | ICD-10-CM | POA: Insufficient documentation

## 2018-04-18 DIAGNOSIS — F1410 Cocaine abuse, uncomplicated: Secondary | ICD-10-CM | POA: Insufficient documentation

## 2018-04-18 LAB — TOXASSURE SELECT 13 (MW), URINE

## 2018-04-19 ENCOUNTER — Encounter: Payer: Self-pay | Admitting: Pain Medicine

## 2018-04-19 ENCOUNTER — Ambulatory Visit: Payer: Medicaid Other | Attending: Pain Medicine | Admitting: Pain Medicine

## 2018-04-19 VITALS — BP 130/84 | HR 54 | Temp 98.2°F | Resp 16 | Ht 75.0 in | Wt 217.0 lb

## 2018-04-19 DIAGNOSIS — I2510 Atherosclerotic heart disease of native coronary artery without angina pectoris: Secondary | ICD-10-CM | POA: Insufficient documentation

## 2018-04-19 DIAGNOSIS — M5136 Other intervertebral disc degeneration, lumbar region: Secondary | ICD-10-CM | POA: Diagnosis not present

## 2018-04-19 DIAGNOSIS — M25551 Pain in right hip: Secondary | ICD-10-CM | POA: Insufficient documentation

## 2018-04-19 DIAGNOSIS — Z7902 Long term (current) use of antithrombotics/antiplatelets: Secondary | ICD-10-CM | POA: Diagnosis not present

## 2018-04-19 DIAGNOSIS — M47892 Other spondylosis, cervical region: Secondary | ICD-10-CM | POA: Diagnosis not present

## 2018-04-19 DIAGNOSIS — F1220 Cannabis dependence, uncomplicated: Secondary | ICD-10-CM | POA: Diagnosis not present

## 2018-04-19 DIAGNOSIS — M19011 Primary osteoarthritis, right shoulder: Secondary | ICD-10-CM | POA: Diagnosis not present

## 2018-04-19 DIAGNOSIS — M47816 Spondylosis without myelopathy or radiculopathy, lumbar region: Secondary | ICD-10-CM | POA: Diagnosis not present

## 2018-04-19 DIAGNOSIS — G8929 Other chronic pain: Secondary | ICD-10-CM

## 2018-04-19 DIAGNOSIS — Z720 Tobacco use: Secondary | ICD-10-CM | POA: Insufficient documentation

## 2018-04-19 DIAGNOSIS — M160 Bilateral primary osteoarthritis of hip: Secondary | ICD-10-CM | POA: Diagnosis not present

## 2018-04-19 DIAGNOSIS — M47897 Other spondylosis, lumbosacral region: Secondary | ICD-10-CM | POA: Diagnosis not present

## 2018-04-19 DIAGNOSIS — N400 Enlarged prostate without lower urinary tract symptoms: Secondary | ICD-10-CM | POA: Insufficient documentation

## 2018-04-19 DIAGNOSIS — K7460 Unspecified cirrhosis of liver: Secondary | ICD-10-CM | POA: Insufficient documentation

## 2018-04-19 DIAGNOSIS — F1410 Cocaine abuse, uncomplicated: Secondary | ICD-10-CM | POA: Insufficient documentation

## 2018-04-19 DIAGNOSIS — M545 Other chronic pain: Secondary | ICD-10-CM

## 2018-04-19 DIAGNOSIS — M25561 Pain in right knee: Secondary | ICD-10-CM | POA: Diagnosis not present

## 2018-04-19 DIAGNOSIS — M961 Postlaminectomy syndrome, not elsewhere classified: Secondary | ICD-10-CM

## 2018-04-19 DIAGNOSIS — F1020 Alcohol dependence, uncomplicated: Secondary | ICD-10-CM | POA: Diagnosis not present

## 2018-04-19 DIAGNOSIS — Z79899 Other long term (current) drug therapy: Secondary | ICD-10-CM | POA: Insufficient documentation

## 2018-04-19 DIAGNOSIS — K219 Gastro-esophageal reflux disease without esophagitis: Secondary | ICD-10-CM | POA: Insufficient documentation

## 2018-04-19 DIAGNOSIS — F319 Bipolar disorder, unspecified: Secondary | ICD-10-CM | POA: Diagnosis not present

## 2018-04-19 DIAGNOSIS — Z79891 Long term (current) use of opiate analgesic: Secondary | ICD-10-CM | POA: Diagnosis not present

## 2018-04-19 DIAGNOSIS — Z5181 Encounter for therapeutic drug level monitoring: Secondary | ICD-10-CM | POA: Diagnosis not present

## 2018-04-19 DIAGNOSIS — Z791 Long term (current) use of non-steroidal anti-inflammatories (NSAID): Secondary | ICD-10-CM | POA: Insufficient documentation

## 2018-04-19 DIAGNOSIS — F209 Schizophrenia, unspecified: Secondary | ICD-10-CM | POA: Insufficient documentation

## 2018-04-19 DIAGNOSIS — G894 Chronic pain syndrome: Secondary | ICD-10-CM | POA: Diagnosis not present

## 2018-04-19 DIAGNOSIS — R3121 Asymptomatic microscopic hematuria: Secondary | ICD-10-CM | POA: Insufficient documentation

## 2018-04-19 DIAGNOSIS — M47817 Spondylosis without myelopathy or radiculopathy, lumbosacral region: Secondary | ICD-10-CM

## 2018-04-19 DIAGNOSIS — M25562 Pain in left knee: Secondary | ICD-10-CM | POA: Diagnosis not present

## 2018-04-19 DIAGNOSIS — F1210 Cannabis abuse, uncomplicated: Secondary | ICD-10-CM

## 2018-04-19 DIAGNOSIS — M25511 Pain in right shoulder: Secondary | ICD-10-CM | POA: Diagnosis not present

## 2018-04-19 DIAGNOSIS — M25552 Pain in left hip: Secondary | ICD-10-CM | POA: Insufficient documentation

## 2018-04-19 DIAGNOSIS — Z7901 Long term (current) use of anticoagulants: Secondary | ICD-10-CM

## 2018-04-19 DIAGNOSIS — Z9114 Patient's other noncompliance with medication regimen: Secondary | ICD-10-CM

## 2018-04-19 DIAGNOSIS — B182 Chronic viral hepatitis C: Secondary | ICD-10-CM | POA: Insufficient documentation

## 2018-04-19 DIAGNOSIS — Z7982 Long term (current) use of aspirin: Secondary | ICD-10-CM | POA: Insufficient documentation

## 2018-04-19 DIAGNOSIS — J449 Chronic obstructive pulmonary disease, unspecified: Secondary | ICD-10-CM | POA: Insufficient documentation

## 2018-04-19 DIAGNOSIS — M170 Bilateral primary osteoarthritis of knee: Secondary | ICD-10-CM | POA: Insufficient documentation

## 2018-04-19 NOTE — Patient Instructions (Signed)
____________________________________________________________________________________________  Post-Procedure Discharge Instructions  Instructions:  Apply ice: Fill a plastic sandwich bag with crushed ice. Cover it with a small towel and apply to injection site. Apply for 15 minutes then remove x 15 minutes. Repeat sequence on day of procedure, until you go to bed. The purpose is to minimize swelling and discomfort after procedure.  Apply heat: Apply heat to procedure site starting the day following the procedure. The purpose is to treat any soreness and discomfort from the procedure.  Food intake: Start with clear liquids (like water) and advance to regular food, as tolerated.   Physical activities: Keep activities to a minimum for the first 8 hours after the procedure.   Driving: If you have received any sedation, you are not allowed to drive for 24 hours after your procedure.  Blood thinner: Restart your blood thinner 6 hours after your procedure. (Only for those taking blood thinners)  Insulin: As soon as you can eat, you may resume your normal dosing schedule. (Only for those taking insulin)  Infection prevention: Keep procedure site clean and dry.  Post-procedure Pain Diary: Extremely important that this be done correctly and accurately. Recorded information will be used to determine the next step in treatment.  Pain evaluated is that of treated area only. Do not include pain from an untreated area.  Complete every hour, on the hour, for the initial 8 hours. Set an alarm to help you do this part accurately.  Do not go to sleep and have it completed later. It will not be accurate.  Follow-up appointment: Keep your follow-up appointment after the procedure. Usually 2 weeks for most procedures. (6 weeks in the case of radiofrequency.) Bring you pain diary.   Expect:  From numbing medicine (AKA: Local Anesthetics): Numbness or decrease in pain.  Onset: Full effect within 15  minutes of injected.  Duration: It will depend on the type of local anesthetic used. On the average, 1 to 8 hours.   From steroids: Decrease in swelling or inflammation. Once inflammation is improved, relief of the pain will follow.  Onset of benefits: Depends on the amount of swelling present. The more swelling, the longer it will take for the benefits to be seen. In some cases, up to 10 days.  Duration: Steroids will stay in the system x 2 weeks. Duration of benefits will depend on multiple posibilities including persistent irritating factors.  Occasional side-effects: Facial flushing, cramps (if present, drink Gatorade and take over-the-counter Magnesium 450-500 mg once to twice a day).  From procedure: Some discomfort is to be expected once the numbing medicine wears off. This should be minimal if ice and heat are applied as instructed.  Call if:  You experience numbness and weakness that gets worse with time, as opposed to wearing off.  New onset bowel or bladder incontinence. (This applies to Spinal procedures only)  Emergency Numbers:  Durning business hours (Monday - Thursday, 8:00 AM - 4:00 PM) (Friday, 9:00 AM - 12:00 Noon): [phone]  After hours: [phone] ____________________________________________________________________________________________    

## 2018-04-19 NOTE — Progress Notes (Signed)
Safety precautions to be maintained throughout the outpatient stay will include: orient to surroundings, keep bed in low position, maintain call bell within reach at all times, provide assistance with transfer out of bed and ambulation.  

## 2018-04-19 NOTE — Progress Notes (Signed)
Patient's Name: Keith Gibbs  MRN: [id number]  Referring Provider: Virginia Crews, MD  DOB: 11-May-1961  PCP: Virginia Crews, MD  DOS: 04/19/2018  Note by: Gaspar Cola, MD  Service setting: Ambulatory outpatient  Specialty: Interventional Pain Management  Location: [facility] (AMB) Pain Management Facility    Patient type: Established   Primary Reason(s) for Visit: Encounter for prescription drug management. (Level of risk: moderate)  CC: Back Pain  HPI  Keith Gibbs is a 57 y.o. year old, male patient, who comes today for a medication management evaluation. He has Schizophrenia ([place]); COPD (chronic obstructive pulmonary disease) ([place]); Chronic hepatitis C ([place]); GERD (gastroesophageal reflux disease); Abnormal ejaculation; Chest pain, unspecified; Bipolar 1 disorder ([place]); Chest pain; Severe recurrent major depression without psychotic features ([place]); Coronary artery disease; Alcohol use disorder, moderate, dependence ([place]); Cannabis use disorder, moderate, dependence ([place]); Overdose of medication, intentional self-harm, sequela ([place]); Tobacco use disorder; Chronic knee pain (Primary Area of Pain)  (Bilateral) (R>L); Chronic low back pain (Secondary Area of Pain) (Bilateral); Chronic hip pain (Tertiary Area of Pain) (Bilateral) (R>L); Chronic shoulder pain (Fourth Area of Pain) (Right); Spondylosis without myelopathy or radiculopathy, cervical region; Chronic pain syndrome; Opiate use; Pharmacologic therapy; Disorder of skeletal system; Problems influencing health status; 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); Chronic neck pain (Fifth Area of Pain) (Midline); Chronic musculoskeletal pain; 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); Lumbar facet syndrome (Bilateral); Spondylosis without myelopathy or radiculopathy, lumbosacral region; Osteoarthritis of shoulder (Right); Osteoarthritis of acromioclavicular joint (Right); 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 His primarily concern today is the Back Pain  Pain Assessment: Location: Right Back Radiating: right hip Onset: More than a month ago Duration: Chronic pain Quality: Aching, Burning, Constant, Dull Severity: 3 /10 (subjective, self-reported pain score)  Note: Reported level is compatible with observation.  Effect on ADL: limits activities Timing: Constant Modifying factors: laying down meds BP: 130/84  HR: (!) 54  Keith Gibbs comes into the clinic today scheduled for a diagnostic bilateral lumbar facet block under fluoroscopic guidance and IV sedation.  As we were doing our preprocedure assessment, we noticed that the patient had been placed on Plavix (anticoagulant).  The patient had not notified us of this and therefore we had not stopped it for 7 to 10 days prior to the procedure.  To minimize the risk of any bleeding and problems as a consequence of this, we have canceled today's procedure and rescheduled it.  We will be contacting the patient's prescribing physician to determine if he can stop the Plavix for his procedures done.  In addition, we reviewed the patient's recent UDS which came back positive for cocaine and marijuana metabolites as well as oxycodone, which we are not prescribing.  In view of this, we had a serious conversation with the patient and we have informed him that we will not be prescribing any other opioids in the future.  He understood and accepted.  The patient   reports that he has current or past drug history. Drugs: Cocaine and Marijuana. His body mass index is 27.12 kg/m.  Further details on  both, my assessment(s), as well as the proposed treatment plan, please see below.  Controlled Substance Pharmacotherapy Assessment REMS (Risk Evaluation and Mitigation Strategy)  Analgesic: Hydrocodone/APAP 5/325 1 tablet p.o. every 6 hours MME/day: 20 mg/day.  Keith Shorter, RN  04/19/2018  1:25 PM  Signed Safety precautions to be maintained throughout the outpatient stay will include: orient to surroundings, keep bed in low position, maintain call bell within reach at all times, provide assistance with transfer out of bed and ambulation.   Monitoring: [place] PMP: Online review of the past 74-month period conducted. The PMP reveals that nobody has recently prescribed any oxycodone for this patient.  What we are prescribing for him was hydrocodone.  This would suggest that the source of this oxycodone was elicited.  When the patient was confronted, he indicated that he got it from a "friend". Last UDS on record: Summary  Date Value Ref Range Status  04/12/2018 FINAL  Final    Comment:    ==================================================================== TOXASSURE SELECT 13 (MW) ==================================================================== Test                             Result       Flag       Units Drug Present not Declared for Prescription Verification   Benzoylecgonine                1355         UNEXPECTED ng/mg creat    Benzoylecgonine is a metabolite of cocaine; its presence    indicates use of this drug.  Source is most commonly illicit, but    cocaine is present in some topical anesthetic solutions.   Carboxy-THC                    133          UNEXPECTED ng/mg creat    Carboxy-THC is a metabolite of tetrahydrocannabinol  (THC).    Source of [facility] is most commonly illicit, but THC is also present    in a scheduled prescription medication.    Oxycodone                      276          UNEXPECTED ng/mg creat   Noroxycodone                   382          UNEXPECTED ng/mg creat    Sources of oxycodone include scheduled prescription medications.    Noroxycodone is an expected metabolite of oxycodone. ==================================================================== Test                      Result    Flag   Units      Ref Range   Creatinine              33               mg/dL      >=20 ==================================================================== Declared Medications:  The flagging and interpretation on this report are based on the  following declared medications.  Unexpected results may arise from  inaccuracies in the declared medications.  **Note: The testing scope of this panel does not include following  reported medications:  Acetaminophen  Aspirin (Aspirin 81)  Atorvastatin  Cetirizine  Formoterol (Mometasone-Formoterol)  Meloxicam  Mometasone (Mometasone-Formoterol)  Nitroglycerin  Omeprazole  Prazosin  Ranolazine  Ropinirole  Tamsulosin  Ticagrelor ==================================================================== For clinical consultation, please call [phone]. ====================================================================    UDS interpretation: Non-Compliant Undeclared illicit substance detected Medication Assessment Form: Discrepancies found between patient's report and information collected Treatment compliance: Non-compliant Risk Assessment Profile: Aberrant behavior: diminished ability to recognize a problem with one's behavior or use of the medication, non-adherence to medication regimen, non-compliance with medical instructions on the proper use of the medication, non-compliance with practice rules and regulations, obtaining controlled substances from inappropriate sources, obtaining medications from illicit sources, prescription  drug abuse, prescription misuse, unsafe use of  medication, use of illicit substances and use of someone else's medication Comorbid factors increasing risk of overdose: caucasian, COPD or asthma, family history of addiction, history of alcoholism, history of attempted suicide , history of drug addiction, history of non-compliance with medical advice, history of previous overdose, history of substance abuse, history of substance use disorder, male gender, nicotine dependence and signs of non-medical use of Opioids Risk of substance use disorder (SUD): Very High  Pharmacologic Plan: Treatment plan will be modified to exclude opioids.             Meds   Current Outpatient Medications:  .  aspirin EC 81 MG tablet, Take 1 tablet (81 mg total) by mouth daily., Disp: 30 tablet, Rfl: 1 .  atorvastatin (LIPITOR) 40 MG tablet, Take 1 tablet (40 mg total) by mouth daily at 6 PM., Disp: 30 tablet, Rfl: 1 .  cetirizine (ZYRTEC) 10 MG tablet, Take 10 mg by mouth daily., Disp: , Rfl:  .  clopidogrel (PLAVIX) 75 MG tablet, Take 75 mg by mouth daily., Disp: , Rfl:  .  DULERA 200-5 MCG/ACT AERO, TAKE 2 PUFFS BY MOUTH TWICE A DAY, Disp: 1 Inhaler, Rfl: 1 .  HYDROcodone-acetaminophen (NORCO) 5-325 MG tablet, Take 1 tablet by mouth every 6 (six) hours as needed for severe pain. Max.: 4/day. Must Last 30 days., Disp: 120 tablet, Rfl: 0 .  meloxicam (MOBIC) 7.5 MG tablet, Take 7.5 mg by mouth daily., Disp: , Rfl:  .  nitroGLYCERIN (NITROSTAT) 0.4 MG SL tablet, Place 1 tablet (0.4 mg total) under the tongue every 5 (five) minutes x 3 doses as needed for chest pain., Disp: 30 tablet, Rfl: 0 .  omeprazole (PRILOSEC) 40 MG capsule, Take 40 mg by mouth daily., Disp: , Rfl:  .  prazosin (MINIPRESS) 1 MG capsule, Take 3 capsules (3 mg total) by mouth at bedtime., Disp: 30 capsule, Rfl: 1 .  ranolazine (RANEXA) 500 MG 12 hr tablet, Take 500 mg by mouth 2 (two) times daily., Disp: , Rfl:  .  rOPINIRole (REQUIP) 0.5 MG tablet, TAKE 1 TO 2 TABLETS BY MOUTH TWICE DAILY AS  NEEDED FOR RESTLESS LEGS., Disp: , Rfl: 1 .  tamsulosin (FLOMAX) 0.4 MG CAPS capsule, Take 1 capsule (0.4 mg total) by mouth daily., Disp: 30 capsule, Rfl: 1 .  ticagrelor (BRILINTA) 60 MG TABS tablet, Take 60 mg by mouth 2 (two) times daily. , Disp: , Rfl:   Constitutional Exam  General appearance: Well nourished, well developed, and well hydrated. In no apparent acute distress Vitals:   04/19/18 1312  BP: 130/84  Pulse: (!) 54  Resp: 16  Temp: 98.2 F (36.8 C)  TempSrc: Oral  SpO2: 99%  Weight: 217 lb (98.4 kg)  Height: 6\' 3"  (1.905 m)   BMI Assessment: Estimated body mass index is 27.12 kg/m as calculated from the following:   Height as of this encounter:  6\' 3"  (1.905 m).   Weight as of this encounter: 217 lb (98.4 kg).  Assessment  Primary Diagnosis & Pertinent Problem List: The primary encounter diagnosis was Spondylosis without myelopathy or radiculopathy, lumbosacral region. Diagnoses of Lumbar facet syndrome (Bilateral), DDD (degenerative disc disease), lumbar, Chronic low back pain (Secondary Area of Pain) (Bilateral), Failed back surgical syndrome, Cocaine abuse ([place]) (See 04/12/18 UDS), Marijuana abuse (See 04/12/18 UDS), Chronic anticoagulation (Plavix), and Pain medication agreement broken [facility]) were also pertinent to this visit.  Status Diagnosis  Unimproved Unimproved Unimproved 1. Spondylosis without myelopathy or radiculopathy, lumbosacral region   2. Lumbar facet syndrome (Bilateral)   3. DDD (degenerative disc disease), lumbar   4. Chronic low back pain (Secondary Area of Pain) (Bilateral)   5. Failed back surgical syndrome   6. Cocaine abuse ([place]) (See 04/12/18 UDS)   7. Marijuana abuse (See 04/12/18 UDS)   8. Chronic anticoagulation (Plavix)   9. Pain medication agreement broken [facility])     Problems updated and reviewed during this visit: Problem  Failed Back Surgical Syndrome  Chronic anticoagulation (Plavix)   Stop: 7-10 days Restart: 2 hrs after  procedure.   Pain medication agreement broken [facility])   04/12/18 UDS (+) for unreported Cocaine, Marijuana, and Oxycodone (illicit source). We were prescribing Hydrocodone for him. PMP does not show anyone prescribing oxycodone recently. This is a violation of the medication agreement.  Plan: Controlled substance pharmacotherapy terminated. [facility] (Dr. Dossie Arbour)   Cocaine Use  Long Term Current Use of Opiate Analgesic  Cirrhosis of Liver (Hcc)  Bph (Benign Prostatic Hyperplasia)  Asymptomatic Microscopic Hematuria   Plan of Care  Procedure Orders     LUMBAR FACET(MEDIAL BRANCH NERVE BLOCK) [facility] Lab Orders  No laboratory test(s) ordered today   Interventional management options: Planned, scheduled, and/or pending:   NOTE: Stop Plavix 7 to 10 days prior to procedures. Diagnostic bilateral lumbar facet block #1 under fluoroscopic guidance and IV sedation   Considering:   Diagnostic left intra-articular Hyalgan knee injection series  Diagnostic bilateral genicular nerve block Possiblebilateral genicular nerve RFA Diagnostic lumbar facet nerve block Possible bilateral lumbar facet RFA Diagnostic bilateral hip injections Diagnosticright suprascapular nerve block Possible right suprascapular nerve RFA Diagnosticbilateral cervical facet nerve block Possible bilateral cervical RFA   Palliative PRN treatment(s):   None at this time   Provider-requested follow-up: Return for Procedure (w/ sedation): (B) L-FCT BLK #1, (Blood-thinner Protocol).  Future Appointments  Date Time Provider [place]  05/07/2018 10:45 AM Vevelyn Francois, NP [facility] None  07/02/2018 10:40 AM Bacigalupo, Dionne Bucy, MD [facility] None   Primary Care Physician: Virginia Crews, MD Location: [facility] Outpatient Pain Management Facility Note by: Gaspar Cola, MD Date: 04/19/2018; Time: 2:38 PM

## 2018-04-20 ENCOUNTER — Telehealth: Payer: Self-pay | Admitting: Pain Medicine

## 2018-04-20 NOTE — Telephone Encounter (Signed)
[facility] lvmail 04-19-18 4:19 stating they needed to know what type of procedure patient was having before they could give medical clearance.

## 2018-04-20 NOTE — Telephone Encounter (Signed)
Left message on [facility] answering machine informing them that patient needed approval to stop Plavix for 7 days for a Bilateral Lumbar Facet block.

## 2018-04-24 ENCOUNTER — Ambulatory Visit: Payer: Self-pay | Admitting: Family Medicine

## 2018-04-24 ENCOUNTER — Telehealth: Payer: Self-pay

## 2018-04-24 NOTE — Telephone Encounter (Signed)
Patient notified that Clopidogrel was the generic for Plavix and he recognized which pill bottle it was and was instructed not to take for 7 days prior to procedure.

## 2018-04-24 NOTE — Telephone Encounter (Signed)
The patient called in asking which medicine he is supposed to stop taking for 7 days before his procedure. I told him plavix but he is not sure which one it is. He is asking for the generic name for plavix.Marland Kitchen

## 2018-05-01 ENCOUNTER — Ambulatory Visit: Payer: Self-pay | Admitting: Pain Medicine

## 2018-05-01 NOTE — Progress Notes (Deleted)
Patient's Name: Keith Gibbs  MRN: [id number]  Referring Provider: Milinda Pointer, MD  DOB: September 01, 1960  PCP: Virginia Crews, MD  DOS: 05/01/2018  Note by: Gaspar Cola, MD  Service setting: Ambulatory outpatient  Specialty: Interventional Pain Management  Patient type: Established  Location: [facility] (AMB) Pain Management Facility  Visit type: Interventional Procedure   Primary Reason for Visit: Interventional Pain Management Treatment. CC: No chief complaint on file.  Procedure:          Anesthesia, Analgesia, Anxiolysis:  Type: Lumbar Facet, Medial Branch Block(s) #1  Primary Purpose: Diagnostic Region: Posterolateral Lumbosacral Spine Level: L2, L3, L4, L5, & S1 Medial Branch Level(s). Injecting these levels blocks the L3-4, L4-5, and L5-S1 lumbar facet joints. Laterality: Bilateral  Type: Moderate (Conscious) Sedation combined with Local Anesthesia Indication(s): Analgesia and Anxiety Route: Intravenous (IV) IV Access: Secured Sedation: Meaningful verbal contact was maintained at all times during the procedure  Local Anesthetic: Lidocaine 1-2%  Position: Prone   Indications: 1. Spondylosis without myelopathy or radiculopathy, lumbosacral region   2. Lumbar facet syndrome (Bilateral)   3. Chronic low back pain (Secondary Area of Pain) (Bilateral)   4. Chronic anticoagulation (Plavix)    Pain Score: Pre-procedure:  /10 Post-procedure:  /10  Pre-op Assessment:  Keith Gibbs is a 57 y.o. (year old), male patient, seen today for interventional treatment. He  has a past surgical history that includes Appendectomy; Shoulder surgery (Right, 04/09/2012); Back surgery; Abdominal surgery; Colonoscopy; Cardiac catheterization (Left, 06/10/2016); Cardiac catheterization (N/A, 06/10/2016); Esophagogastroduodenoscopy (egd) with propofol (N/A, 01/05/2017); Colonoscopy with propofol (N/A, 01/05/2017); Coronary angioplasty with stent; Knee arthroscopy with medial menisectomy (Right,  09/05/2017); and Spine surgery. Mr. Isenberg has a current medication list which includes the following prescription(s): aspirin ec, atorvastatin, cetirizine, clopidogrel, dulera, hydrocodone-acetaminophen, meloxicam, nitroglycerin, omeprazole, prazosin, ranolazine, ropinirole, tamsulosin, and ticagrelor. His primarily concern today is the No chief complaint on file.  Initial Vital Signs:  Pulse/HCG Rate:    Temp:   Resp:   BP:   SpO2:    BMI: Estimated body mass index is 27.12 kg/m as calculated from the following:   Height as of 04/19/18: 6\' 3"  (1.905 m).   Weight as of 04/19/18: 217 lb (98.4 kg).  Risk Assessment: Allergies: Reviewed. He is allergic to latuda [lurasidone hcl]; naproxen; and saphris [asenapine].  Allergy Precautions: None required Coagulopathies: Reviewed. None identified.  Blood-thinner therapy: None at this time Active Infection(s): Reviewed. None identified. Keith Gibbs is afebrile  Site Confirmation: Keith Gibbs was asked to confirm the procedure and laterality before marking the site Procedure checklist: Completed Consent: Before the procedure and under the influence of no sedative(s), amnesic(s), or anxiolytics, the patient was informed of the treatment options, risks and possible complications. To fulfill our ethical and legal obligations, as recommended by the American Medical Association's Code of Ethics, I have informed the patient of my clinical impression; the nature and purpose of the treatment or procedure; the risks, benefits, and possible complications of the intervention; the alternatives, including doing nothing; the risk(s) and benefit(s) of the alternative treatment(s) or procedure(s); and the risk(s) and benefit(s) of doing nothing. The patient was provided information about the general risks and possible complications associated with the procedure. These may include, but are not limited to: failure to achieve desired goals, infection, bleeding, organ or nerve  damage, allergic reactions, paralysis, and death. In addition, the patient was informed of those risks and complications associated to Spine-related procedures, such as failure to decrease pain; infection (i.e.:  Meningitis, epidural or intraspinal abscess); bleeding (i.e.: epidural hematoma, subarachnoid hemorrhage, or any other type of intraspinal or peri-dural bleeding); organ or nerve damage (i.e.: Any type of peripheral nerve, nerve root, or spinal cord injury) with subsequent damage to sensory, motor, and/or autonomic systems, resulting in permanent pain, numbness, and/or weakness of one or several areas of the body; allergic reactions; (i.e.: anaphylactic reaction); and/or death. Furthermore, the patient was informed of those risks and complications associated with the medications. These include, but are not limited to: allergic reactions (i.e.: anaphylactic or anaphylactoid reaction(s)); adrenal axis suppression; blood sugar elevation that in diabetics may result in ketoacidosis or comma; water retention that in patients with history of congestive heart failure may result in shortness of breath, pulmonary edema, and decompensation with resultant heart failure; weight gain; swelling or edema; medication-induced neural toxicity; particulate matter embolism and blood vessel occlusion with resultant organ, and/or nervous system infarction; and/or aseptic necrosis of one or more joints. Finally, the patient was informed that Medicine is not an exact science; therefore, there is also the possibility of unforeseen or unpredictable risks and/or possible complications that may result in a catastrophic outcome. The patient indicated having understood very clearly. We have given the patient no guarantees and we have made no promises. Enough time was given to the patient to ask questions, all of which were answered to the patient's satisfaction. Keith Gibbs has indicated that he wanted to continue with the  procedure. Attestation: I, the ordering provider, attest that I have discussed with the patient the benefits, risks, side-effects, alternatives, likelihood of achieving goals, and potential problems during recovery for the procedure that I have provided informed consent. Date  Time: {CHL [facility]-PAIN TIME CHOICES:[id number]}  Pre-Procedure Preparation:  Monitoring: As per clinic protocol. Respiration, ETCO2, SpO2, BP, heart rate and rhythm monitor placed and checked for adequate function Safety Precautions: Patient was assessed for positional comfort and pressure points before starting the procedure. Time-out: I initiated and conducted the "Time-out" before starting the procedure, as per protocol. The patient was asked to participate by confirming the accuracy of the "Time Out" information. Verification of the correct person, site, and procedure were performed and confirmed by me, the nursing staff, and the patient. "Time-out" conducted as per Joint Commission's Universal Protocol (UP.01.01.01). Time:    Description of Procedure:          Laterality: Bilateral. The procedure was performed in identical fashion on both sides. Levels:  L2, L3, L4, L5, & S1 Medial Branch Level(s) Area Prepped: Posterior Lumbosacral Region Prepping solution: ChloraPrep (2% chlorhexidine gluconate and 70% isopropyl alcohol) Safety Precautions: Aspiration looking for blood return was conducted prior to all injections. At no point did we inject any substances, as a needle was being advanced. Before injecting, the patient was told to immediately notify me if he was experiencing any new onset of "ringing in the ears, or metallic taste in the mouth". No attempts were made at seeking any paresthesias. Safe injection practices and needle disposal techniques used. Medications properly checked for expiration dates. SDV (single dose vial) medications used. After the completion of the procedure, all disposable equipment used was discarded  in the proper designated medical waste containers. Local Anesthesia: Protocol guidelines were followed. The patient was positioned over the fluoroscopy table. The area was prepped in the usual manner. The time-out was completed. The target area was identified using fluoroscopy. A 12-in long, straight, sterile hemostat was used with fluoroscopic guidance to locate the targets for each level blocked. Once  located, the skin was marked with an approved surgical skin marker. Once all sites were marked, the skin (epidermis, dermis, and hypodermis), as well as deeper tissues (fat, connective tissue and muscle) were infiltrated with a small amount of a short-acting local anesthetic, loaded on a 10cc syringe with a 25G, 1.5-in  Needle. An appropriate amount of time was allowed for local anesthetics to take effect before proceeding to the next step. Local Anesthetic: Lidocaine 2.0% The unused portion of the local anesthetic was discarded in the proper designated containers. Technical explanation of process:  L2 Medial Branch Nerve Block (MBB): The target area for the L2 medial branch is at the junction of the postero-lateral aspect of the superior articular process and the superior, posterior, and medial edge of the transverse process of L3. Under fluoroscopic guidance, a Quincke needle was inserted until contact was made with os over the superior postero-lateral aspect of the pedicular shadow (target area). After negative aspiration for blood, 0.5 mL of the nerve block solution was injected without difficulty or complication. The needle was removed intact. L3 Medial Branch Nerve Block (MBB): The target area for the L3 medial branch is at the junction of the postero-lateral aspect of the superior articular process and the superior, posterior, and medial edge of the transverse process of L4. Under fluoroscopic guidance, a Quincke needle was inserted until contact was made with os over the superior postero-lateral aspect  of the pedicular shadow (target area). After negative aspiration for blood, 0.5 mL of the nerve block solution was injected without difficulty or complication. The needle was removed intact. L4 Medial Branch Nerve Block (MBB): The target area for the L4 medial branch is at the junction of the postero-lateral aspect of the superior articular process and the superior, posterior, and medial edge of the transverse process of L5. Under fluoroscopic guidance, a Quincke needle was inserted until contact was made with os over the superior postero-lateral aspect of the pedicular shadow (target area). After negative aspiration for blood, 0.5 mL of the nerve block solution was injected without difficulty or complication. The needle was removed intact. L5 Medial Branch Nerve Block (MBB): The target area for the L5 medial branch is at the junction of the postero-lateral aspect of the superior articular process and the superior, posterior, and medial edge of the sacral ala. Under fluoroscopic guidance, a Quincke needle was inserted until contact was made with os over the superior postero-lateral aspect of the pedicular shadow (target area). After negative aspiration for blood, 0.5 mL of the nerve block solution was injected without difficulty or complication. The needle was removed intact. S1 Medial Branch Nerve Block (MBB): The target area for the S1 medial branch is at the posterior and inferior 6 o'clock position of the L5-S1 facet joint. Under fluoroscopic guidance, the Quincke needle inserted for the L5 MBB was redirected until contact was made with os over the inferior and postero aspect of the sacrum, at the 6 o' clock position under the L5-S1 facet joint (Target area). After negative aspiration for blood, 0.5 mL of the nerve block solution was injected without difficulty or complication. The needle was removed intact. Procedural Needles: 22-gauge, 3.5-inch, Quincke needles used for all levels. Nerve block solution:  0.2% PF-Ropivacaine + Triamcinolone (40 mg/mL) diluted to a final concentration of 4 mg of Triamcinolone/mL of Ropivacaine The unused portion of the solution was discarded in the proper designated containers.  Once the entire procedure was completed, the treated area was cleaned, making sure to leave  some of the prepping solution back to take advantage of its long term bactericidal properties.   Illustration of the posterior view of the lumbar spine and the posterior neural structures. Laminae of L2 through S1 are labeled. DPRL5, dorsal primary ramus of L5; DPRS1, dorsal primary ramus of S1; DPR3, dorsal primary ramus of L3; FJ, facet (zygapophyseal) joint L3-L4; I, inferior articular process of L4; LB1, lateral branch of dorsal primary ramus of L1; IAB, inferior articular branches from L3 medial branch (supplies L4-L5 facet joint); IBP, intermediate branch plexus; MB3, medial branch of dorsal primary ramus of L3; NR3, third lumbar nerve root; S, superior articular process of L5; SAB, superior articular branches from L4 (supplies L4-5 facet joint also); TP3, transverse process of L3.  There were no vitals filed for this visit.   Start Time:   hrs. End Time:   hrs.  Imaging Guidance (Spinal):          Type of Imaging Technique: Fluoroscopy Guidance (Spinal) Indication(s): Assistance in needle guidance and placement for procedures requiring needle placement in or near specific anatomical locations not easily accessible without such assistance. Exposure Time: Please see nurses notes. Contrast: None used. Fluoroscopic Guidance: I was personally present during the use of fluoroscopy. "Tunnel Vision Technique" used to obtain the best possible view of the target area. Parallax error corrected before commencing the procedure. "Direction-depth-direction" technique used to introduce the needle under continuous pulsed fluoroscopy. Once target was reached, antero-posterior, oblique, and lateral fluoroscopic  projection used confirm needle placement in all planes. Images permanently stored in EMR. Interpretation: No contrast injected. I personally interpreted the imaging intraoperatively. Adequate needle placement confirmed in multiple planes. Permanent images saved into the patient's record.  Antibiotic Prophylaxis:   Anti-infectives (From admission, onward)   None     Indication(s): None identified  Post-operative Assessment:  Post-procedure Vital Signs:  Pulse/HCG Rate:    Temp:   Resp:   BP:   SpO2:    EBL: None  Complications: No immediate post-treatment complications observed by team, or reported by patient.  Note: The patient tolerated the entire procedure well. A repeat set of vitals were taken after the procedure and the patient was kept under observation following institutional policy, for this type of procedure. Post-procedural neurological assessment was performed, showing return to baseline, prior to discharge. The patient was provided with post-procedure discharge instructions, including a section on how to identify potential problems. Should any problems arise concerning this procedure, the patient was given instructions to immediately contact us, at any time, without hesitation. In any case, we plan to contact the patient by telephone for a follow-up status report regarding this interventional procedure.  Comments:  No additional relevant information.  Plan of Care   Imaging Orders  No imaging studies ordered today   Procedure Orders    No procedure(s) ordered today    Medications ordered for procedure: No orders of the defined types were placed in this encounter.  Medications administered: Tilton W. Schimek had no medications administered during this visit.  See the medical record for exact dosing, route, and time of administration.  Disposition: Discharge home  Discharge Date & Time: 05/01/2018;   hrs.   Physician-requested Follow-up: No follow-ups on  file.  Future Appointments  Date Time Provider [place]  05/01/2018  9:30 AM Milinda Pointer, MD [facility] None  05/07/2018 10:45 AM Vevelyn Francois, NP [facility] None  07/02/2018 10:40 AM Bacigalupo, Dionne Bucy, MD [facility] None   Primary Care Physician: Lavon Paganini  Jerilynn Mages, MD Location: [facility] Note by: Gaspar Cola, MD Date: 05/01/2018; Time: 6:25 AM  Disclaimer:  Medicine is not an Chief Strategy Officer. The only guarantee in medicine is that nothing is guaranteed. It is important to note that the decision to proceed with this intervention was based on the information collected from the patient. The Data and conclusions were drawn from the patient's questionnaire, the interview, and the physical examination. Because the information was provided in large part by the patient, it cannot be guaranteed that it has not been purposely or unconsciously manipulated. Every effort has been made to obtain as much relevant data as possible for this evaluation. It is important to note that the conclusions that lead to this procedure are derived in large part from the available data. Always take into account that the treatment will also be dependent on availability of resources and existing treatment guidelines, considered by other Pain Management Practitioners as being common knowledge and practice, at the time of the intervention. For Medico-Legal purposes, it is also important to point out that variation in procedural techniques and pharmacological choices are the acceptable norm. The indications, contraindications, technique, and results of the above procedure should only be interpreted and judged by a Board-Certified Interventional Pain Specialist with extensive familiarity and expertise in the same exact procedure and technique.

## 2018-05-07 ENCOUNTER — Encounter: Payer: Self-pay | Admitting: Nurse Practitioner

## 2018-05-07 ENCOUNTER — Encounter: Payer: Self-pay | Admitting: Family Medicine

## 2018-05-07 NOTE — Progress Notes (Signed)
Medical records received from [employer], previous [facility].  Vaccines and health maintenance abstracted and updated in the chart.  Recent labs abstracted.  Important imaging scanned into the chart.  Important information as below  - Complete evaluation by [facility] care on 09/12/2017 reveals: Diagnoses of generalized anxiety disorder, bipolar 1 disorder, stimulant use disorder, severe, cocaine, and alcohol use disorder, moderate.  He reported symptoms of generalized anxiety, depression, manic symptoms, in full remission from alcohol and cocaine.  He had no current suicidal or homicidal ideation, intent, or plan.  He denied any psychotic symptoms.  It was recommended that he begin individual therapy.  He was started on Abilify 5 mg nightly with goal to increase medication over time.  He was continued on bupropion SR 100 mg twice daily.  He qualified for CBT and motivational interviewing. -Patient followed up with [facility] on 01/29/2018.  His Abilify was increased to 20 mg nightly.  His bupropion SR 100 mg twice daily was continued.  He was also on Depakote 500 mg twice daily for mood stabilization - CTA chest from 03/12/2018 shows stable interstitial fibrotic and emphysematous changes, otherwise negative -CTA carotid arteries from 03/07/2018 showed no hemodynamically significant stenosis or ulceration of the carotid arteries and bilaterally patent vertebral arteries.  Extensive calcifications within the right subclavian artery. -CCTA from 02/2018 revealed cardiac calcium score of 401.4, right dominant system, and stent in mid LAD with no significant signs of restenosis and normal LCx and RCA -Echocardiogram from 02/2018 reveals severely dilated left atrium, mildly dilated right atrium, aortic root, and a sending aorta with ventricles appearing normal in size.  Normal LV systolic function.  Mild inferior hypokinesis, mild left ventricular hypertrophy  with grade 1 diastolic dysfunction.  Mild pulmonary regurgitation.  Mild tricuspid regurgitation.  Normal pulmonary artery pressure.  Mild mitral regurgitation.  Mild to moderate aortic regurgitation.  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

## 2018-05-09 LAB — CHLORIDE
Carbon Dioxide, Total: 23
Chloride: 103
Globulin, Total: 2.1
Total Protein: 6.1 g/dL

## 2018-05-09 LAB — URIC ACID: URIC ACID: 4.4

## 2018-05-09 LAB — RHEUMATOID FACTOR

## 2018-05-10 ENCOUNTER — Ambulatory Visit
Admission: RE | Admit: 2018-05-10 | Discharge: 2018-05-10 | Disposition: A | Discharge status: Home / Self Care | Payer: Medicaid Other | Source: Ambulatory Visit | Attending: Pain Medicine | Admitting: Pain Medicine

## 2018-05-10 ENCOUNTER — Encounter: Payer: Self-pay | Admitting: Pain Medicine

## 2018-05-10 ENCOUNTER — Ambulatory Visit: Payer: Medicaid Other | Admitting: Pain Medicine

## 2018-05-10 ENCOUNTER — Other Ambulatory Visit: Payer: Self-pay

## 2018-05-10 VITALS — BP 110/98 | HR 54 | Temp 97.8°F | Resp 17 | Ht 75.0 in | Wt 220.0 lb

## 2018-05-10 DIAGNOSIS — Z79899 Other long term (current) drug therapy: Secondary | ICD-10-CM | POA: Insufficient documentation

## 2018-05-10 DIAGNOSIS — G8929 Other chronic pain: Secondary | ICD-10-CM | POA: Insufficient documentation

## 2018-05-10 DIAGNOSIS — Z7982 Long term (current) use of aspirin: Secondary | ICD-10-CM | POA: Diagnosis not present

## 2018-05-10 DIAGNOSIS — G894 Chronic pain syndrome: Secondary | ICD-10-CM

## 2018-05-10 DIAGNOSIS — Z955 Presence of coronary angioplasty implant and graft: Secondary | ICD-10-CM | POA: Diagnosis not present

## 2018-05-10 DIAGNOSIS — M545 Low back pain, unspecified: Secondary | ICD-10-CM

## 2018-05-10 DIAGNOSIS — Z7902 Long term (current) use of antithrombotics/antiplatelets: Secondary | ICD-10-CM | POA: Insufficient documentation

## 2018-05-10 DIAGNOSIS — M5136 Other intervertebral disc degeneration, lumbar region: Secondary | ICD-10-CM | POA: Diagnosis not present

## 2018-05-10 DIAGNOSIS — M47816 Spondylosis without myelopathy or radiculopathy, lumbar region: Secondary | ICD-10-CM | POA: Insufficient documentation

## 2018-05-10 DIAGNOSIS — Z885 Allergy status to narcotic agent status: Secondary | ICD-10-CM | POA: Insufficient documentation

## 2018-05-10 DIAGNOSIS — Z886 Allergy status to analgesic agent status: Secondary | ICD-10-CM | POA: Insufficient documentation

## 2018-05-10 DIAGNOSIS — M47817 Spondylosis without myelopathy or radiculopathy, lumbosacral region: Secondary | ICD-10-CM

## 2018-05-10 DIAGNOSIS — Z7901 Long term (current) use of anticoagulants: Secondary | ICD-10-CM

## 2018-05-10 MED ORDER — TRIAMCINOLONE ACETONIDE 40 MG/ML IJ SUSP
80.0000 mg | Freq: Once | INTRAMUSCULAR | Status: AC
  Administered 2018-05-10: 80 mg
  Filled 2018-05-10: qty 2

## 2018-05-10 MED ORDER — MIDAZOLAM HCL 5 MG/5ML IJ SOLN
1.0000 mg | INTRAMUSCULAR | Status: DC | PRN
  Administered 2018-05-10: 4 mg via INTRAVENOUS
  Filled 2018-05-10: qty 5

## 2018-05-10 MED ORDER — ROPIVACAINE HCL 2 MG/ML IJ SOLN
18.0000 mL | Freq: Once | INTRAMUSCULAR | Status: AC
  Administered 2018-05-10: 18 mL via PERINEURAL
  Filled 2018-05-10: qty 20

## 2018-05-10 MED ORDER — TRIAMCINOLONE ACETONIDE 40 MG/ML IJ SUSP
INTRAMUSCULAR | Status: AC
  Filled 2018-05-10: qty 1

## 2018-05-10 MED ORDER — LIDOCAINE HCL 2 % IJ SOLN
20.0000 mL | Freq: Once | INTRAMUSCULAR | Status: AC
  Administered 2018-05-10: 400 mg
  Filled 2018-05-10: qty 40

## 2018-05-10 MED ORDER — LACTATED RINGERS IV SOLN
1000.0000 mL | Freq: Once | INTRAVENOUS | Status: AC
  Administered 2018-05-10: 1000 mL via INTRAVENOUS

## 2018-05-10 MED ORDER — FENTANYL CITRATE (PF) 100 MCG/2ML IJ SOLN
25.0000 ug | INTRAMUSCULAR | Status: DC | PRN
  Administered 2018-05-10: 50 ug via INTRAVENOUS
  Filled 2018-05-10: qty 2

## 2018-05-10 NOTE — Progress Notes (Signed)
Safety precautions to be maintained throughout the outpatient stay will include: orient to surroundings, keep bed in low position, maintain call bell within reach at all times, provide assistance with transfer out of bed and ambulation.  

## 2018-05-10 NOTE — Progress Notes (Signed)
Patient's Name: Keith Gibbs  MRN: [id number]  Referring Provider: Milinda Pointer, MD  DOB: 1960/10/07  PCP: Virginia Crews, MD  DOS: 05/10/2018  Note by: Gaspar Cola, MD  Service setting: Ambulatory outpatient  Specialty: Interventional Pain Management  Patient type: Established  Location: [facility] (AMB) Pain Management Facility  Visit type: Interventional Procedure   Primary Reason for Visit: Interventional Pain Management Treatment. CC: Back Pain (low)  Procedure:          Anesthesia, Analgesia, Anxiolysis:  Type: Lumbar Facet, Medial Branch Block(s) #1  Primary Purpose: Diagnostic Region: Posterolateral Lumbosacral Spine Level: L2, L3, L4, L5, & S1 Medial Branch Level(s). Injecting these levels blocks the L3-4, L4-5, and L5-S1 lumbar facet joints. Laterality: Bilateral  Type: Moderate (Conscious) Sedation combined with Local Anesthesia Indication(s): Analgesia and Anxiety Route: Intravenous (IV) IV Access: Secured Sedation: Meaningful verbal contact was maintained at all times during the procedure  Local Anesthetic: Lidocaine 1-2%  Position: Prone   Indications: 1. Spondylosis without myelopathy or radiculopathy, lumbosacral region   2. Lumbar facet syndrome (Bilateral)   3. DDD (degenerative disc disease), lumbar   4. Chronic low back pain (Secondary Area of Pain) (Bilateral)   5. Chronic anticoagulation (Plavix)    Pain Score: Pre-procedure: 6 /10 Post-procedure: 0-No pain/10  Pre-op Assessment:  [name] is a 57 y.o. (year old), male patient, seen today for interventional treatment. He  has a past surgical history that includes Appendectomy; Shoulder surgery (Right, 04/09/2012); Back surgery; Abdominal surgery; Colonoscopy; Cardiac catheterization (Left, 06/10/2016); Cardiac catheterization (N/A, 06/10/2016); Esophagogastroduodenoscopy (egd) with propofol (N/A, 01/05/2017); Colonoscopy with propofol (N/A, 01/05/2017); Coronary angioplasty with stent; Knee  arthroscopy with medial menisectomy (Right, 09/05/2017); and Spine surgery. [name] has a current medication list which includes the following prescription(s): aspirin ec, atorvastatin, cetirizine, clopidogrel, dulera, hydrocodone-acetaminophen, meloxicam, nitroglycerin, omeprazole, prazosin, ranolazine, ropinirole, tamsulosin, and ticagrelor, and the following Facility-Administered Medications: fentanyl, lactated ringers, and midazolam. His primarily concern today is the Back Pain (low)  Initial Vital Signs:  Pulse/HCG Rate: (!) 57ECG Heart Rate: (!) 52 Temp: 98.2 F (36.8 C) Resp: 16 BP: 138/79 SpO2: 99 %  BMI: Estimated body mass index is 27.5 kg/m as calculated from the following:   Height as of this encounter: 6\' 3"  (1.905 m).   Weight as of this encounter: 220 lb (99.8 kg).  Risk Assessment: Allergies: Reviewed. He is allergic to latuda [lurasidone hcl]; naproxen; and saphris [asenapine].  Allergy Precautions: None required Coagulopathies: Reviewed. None identified.  Blood-thinner therapy: None at this time Active Infection(s): Reviewed. None identified. [name] is afebrile  Site Confirmation: [name] was asked to confirm the procedure and laterality before marking the site Procedure checklist: Completed Consent: Before the procedure and under the influence of no sedative(s), amnesic(s), or anxiolytics, the patient was informed of the treatment options, risks and possible complications. To fulfill our ethical and legal obligations, as recommended by the American Medical Association's Code of Ethics, I have informed the patient of my clinical impression; the nature and purpose of the treatment or procedure; the risks, benefits, and possible complications of the intervention; the alternatives, including doing nothing; the risk(s) and benefit(s) of the alternative treatment(s) or procedure(s); and the risk(s) and benefit(s) of doing nothing. The patient was provided information  about the general risks and possible complications associated with the procedure. These may include, but are not limited to: failure to achieve desired goals, infection, bleeding, organ or nerve damage, allergic reactions, paralysis, and death. In addition, the patient  was informed of those risks and complications associated to Spine-related procedures, such as failure to decrease pain; infection (i.e.: Meningitis, epidural or intraspinal abscess); bleeding (i.e.: epidural hematoma, subarachnoid hemorrhage, or any other type of intraspinal or peri-dural bleeding); organ or nerve damage (i.e.: Any type of peripheral nerve, nerve root, or spinal cord injury) with subsequent damage to sensory, motor, and/or autonomic systems, resulting in permanent pain, numbness, and/or weakness of one or several areas of the body; allergic reactions; (i.e.: anaphylactic reaction); and/or death. Furthermore, the patient was informed of those risks and complications associated with the medications. These include, but are not limited to: allergic reactions (i.e.: anaphylactic or anaphylactoid reaction(s)); adrenal axis suppression; blood sugar elevation that in diabetics may result in ketoacidosis or comma; water retention that in patients with history of congestive heart failure may result in shortness of breath, pulmonary edema, and decompensation with resultant heart failure; weight gain; swelling or edema; medication-induced neural toxicity; particulate matter embolism and blood vessel occlusion with resultant organ, and/or nervous system infarction; and/or aseptic necrosis of one or more joints. Finally, the patient was informed that Medicine is not an exact science; therefore, there is also the possibility of unforeseen or unpredictable risks and/or possible complications that may result in a catastrophic outcome. The patient indicated having understood very clearly. We have given the patient no guarantees and we have made no  promises. Enough time was given to the patient to ask questions, all of which were answered to the patient's satisfaction. [name] has indicated that he wanted to continue with the procedure. Attestation: I, the ordering provider, attest that I have discussed with the patient the benefits, risks, side-effects, alternatives, likelihood of achieving goals, and potential problems during recovery for the procedure that I have provided informed consent. Date  Time: 05/10/2018  9:12 AM  Controlled Substance Pharmacotherapy Assessment REMS (Risk Evaluation and Mitigation Strategy)  Analgesic: Hydrocodone/APAP 5/325 1 tablet every 6 hours.  This will be discontinued today secondary to medication agreement violation.  See below. MME/day: 20 mg/day.  [name], RN  05/10/2018  9:18 AM  Signed Safety precautions to be maintained throughout the outpatient stay will include: orient to surroundings, keep bed in low position, maintain call bell within reach at all times, provide assistance with transfer out of bed and ambulation.    Monitoring: [place] PMP: Online review of the past 44-month period conducted. Compliant with practice rules and regulations Last UDS on record: Summary  Date Value Ref Range Status  04/12/2018 FINAL  Final    Comment:    ==================================================================== TOXASSURE SELECT 13 (MW) ==================================================================== Test                             Result       Flag       Units Drug Present not Declared for Prescription Verification   Benzoylecgonine                1355         UNEXPECTED ng/mg creat    Benzoylecgonine is a metabolite of cocaine; its presence    indicates use of this drug.  Source is most commonly illicit, but    cocaine is present in some topical anesthetic solutions.   Carboxy-THC                    133          UNEXPECTED ng/mg creat  Carboxy-THC is a metabolite of tetrahydrocannabinol   (THC).    Source of [facility] is most commonly illicit, but THC is also present    in a scheduled prescription medication.   Oxycodone                      276          UNEXPECTED ng/mg creat   Noroxycodone                   382          UNEXPECTED ng/mg creat    Sources of oxycodone include scheduled prescription medications.    Noroxycodone is an expected metabolite of oxycodone. ==================================================================== Test                      Result    Flag   Units      Ref Range   Creatinine              33               mg/dL      >=20 ==================================================================== Declared Medications:  The flagging and interpretation on this report are based on the  following declared medications.  Unexpected results may arise from  inaccuracies in the declared medications.  **Note: The testing scope of this panel does not include following  reported medications:  Acetaminophen  Aspirin (Aspirin 81)  Atorvastatin  Cetirizine  Formoterol (Mometasone-Formoterol)  Meloxicam  Mometasone (Mometasone-Formoterol)  Nitroglycerin  Omeprazole  Prazosin  Ranolazine  Ropinirole  Tamsulosin  Ticagrelor ==================================================================== For clinical consultation, please call [phone]. ====================================================================    UDS interpretation: Non-Compliant This last UDS shows the presence of metabolites of cocaine and marijuana as well as the presence of oxycodone, which we are not prescribing for this patient.  In addition it does not show the hydrocodone that we prescribed.  This is a significant violation of our medication agreement and it will result in permanent discontinuation of all opioid analgesics and our recommendation to other physicians to stay away from prescribing any controlled substances this patient.  This patient had been given an opportunity to come  clean and do things correctly spiked the fact that he is very high risk for SUD.  Unfortunately he did not take advantage of the opportunity that we provided him. Medication Assessment Form: Review of the form reveals that the patient lied to [place] regarding the above. Treatment compliance: Non-compliant Risk Assessment Profile: Aberrant behavior: buying medication off the street, diminished ability to recognize a problem with one's behavior or use of the medication, drug seeking behavior, extensive time discussing medicaiton, impaired control over use of medications, missing appointments for alternative therapies, missing appointments for interventional therapies, non-adherence to medication regimen, non-adherence to non-pharmacological elements of the treatment plan, non-compliance with medical instructions on the proper use of the medication, non-compliance with practice rules and regulations, obtaining controlled substances from inappropriate sources, obtaining medications from illicit sources, prescription  drug abuse, prescription misuse, unsafe use of medication and use of illicit substances Comorbid factors increasing risk of overdose: caucasian, history of attempted suicide , history of non-compliance with medical advice, history of substance abuse, history of substance use disorder and male gender Risk of substance use disorder (SUD): Very High  Risk Mitigation Strategies:  Patient-Prescriber Agreement (PPA): Medication agreement broken by patient  Notification to other healthcare providers: Done  Pharmacologic Plan: For safety reasons, the treatment plan will be  modified to exclude opioids. Because of gross violations to the patient's medication agreement, today we are terminating his opioid analgesic regimen.  We will not be providing this patient with an opioid taper to discontinue our medications since his last UDS demonstrated that he actually not taking them and in addition to this he  clearly have no problems obtaining opioids.  Since we have clearly lost control over his pharmacological regimen, we will not be contributing any further to his supply.   Pre-Procedure Preparation:  Monitoring: As per clinic protocol. Respiration, ETCO2, SpO2, BP, heart rate and rhythm monitor placed and checked for adequate function Safety Precautions: Patient was assessed for positional comfort and pressure points before starting the procedure. Time-out: I initiated and conducted the "Time-out" before starting the procedure, as per protocol. The patient was asked to participate by confirming the accuracy of the "Time Out" information. Verification of the correct person, site, and procedure were performed and confirmed by me, the nursing staff, and the patient. "Time-out" conducted as per Joint Commission's Universal Protocol (UP.01.01.01). Time: 1010  Description of Procedure:          Laterality: Bilateral. The procedure was performed in identical fashion on both sides. Levels:  L2, L3, L4, L5, & S1 Medial Branch Level(s) Area Prepped: Posterior Lumbosacral Region Prepping solution: ChloraPrep (2% chlorhexidine gluconate and 70% isopropyl alcohol) Safety Precautions: Aspiration looking for blood return was conducted prior to all injections. At no point did we inject any substances, as a needle was being advanced. Before injecting, the patient was told to immediately notify me if he was experiencing any new onset of "ringing in the ears, or metallic taste in the mouth". No attempts were made at seeking any paresthesias. Safe injection practices and needle disposal techniques used. Medications properly checked for expiration dates. SDV (single dose vial) medications used. After the completion of the procedure, all disposable equipment used was discarded in the proper designated medical waste containers. Local Anesthesia: Protocol guidelines were followed. The patient was positioned over the  fluoroscopy table. The area was prepped in the usual manner. The time-out was completed. The target area was identified using fluoroscopy. A 12-in long, straight, sterile hemostat was used with fluoroscopic guidance to locate the targets for each level blocked. Once located, the skin was marked with an approved surgical skin marker. Once all sites were marked, the skin (epidermis, dermis, and hypodermis), as well as deeper tissues (fat, connective tissue and muscle) were infiltrated with a small amount of a short-acting local anesthetic, loaded on a 10cc syringe with a 25G, 1.5-in  Needle. An appropriate amount of time was allowed for local anesthetics to take effect before proceeding to the next step. Local Anesthetic: Lidocaine 2.0% The unused portion of the local anesthetic was discarded in the proper designated containers. Technical explanation of process:  L2 Medial Branch Nerve Block (MBB): The target area for the L2 medial branch is at the junction of the postero-lateral aspect of the superior articular process and the superior, posterior, and medial edge of the transverse process of L3. Under fluoroscopic guidance, a Quincke needle was inserted until contact was made with os over the superior postero-lateral aspect of the pedicular shadow (target area). After negative aspiration for blood, 0.5 mL of the nerve block solution was injected without difficulty or complication. The needle was removed intact. L3 Medial Branch Nerve Block (MBB): The target area for the L3 medial branch is at the junction of the postero-lateral aspect of the superior articular process  and the superior, posterior, and medial edge of the transverse process of L4. Under fluoroscopic guidance, a Quincke needle was inserted until contact was made with os over the superior postero-lateral aspect of the pedicular shadow (target area). After negative aspiration for blood, 0.5 mL of the nerve block solution was injected without difficulty  or complication. The needle was removed intact. L4 Medial Branch Nerve Block (MBB): The target area for the L4 medial branch is at the junction of the postero-lateral aspect of the superior articular process and the superior, posterior, and medial edge of the transverse process of L5. Under fluoroscopic guidance, a Quincke needle was inserted until contact was made with os over the superior postero-lateral aspect of the pedicular shadow (target area). After negative aspiration for blood, 0.5 mL of the nerve block solution was injected without difficulty or complication. The needle was removed intact. L5 Medial Branch Nerve Block (MBB): The target area for the L5 medial branch is at the junction of the postero-lateral aspect of the superior articular process and the superior, posterior, and medial edge of the sacral ala. Under fluoroscopic guidance, a Quincke needle was inserted until contact was made with os over the superior postero-lateral aspect of the pedicular shadow (target area). After negative aspiration for blood, 0.5 mL of the nerve block solution was injected without difficulty or complication. The needle was removed intact. S1 Medial Branch Nerve Block (MBB): The target area for the S1 medial branch is at the posterior and inferior 6 o'clock position of the L5-S1 facet joint. Under fluoroscopic guidance, the Quincke needle inserted for the L5 MBB was redirected until contact was made with os over the inferior and postero aspect of the sacrum, at the 6 o' clock position under the L5-S1 facet joint (Target area). After negative aspiration for blood, 0.5 mL of the nerve block solution was injected without difficulty or complication. The needle was removed intact. Procedural Needles: 22-gauge, 3.5-inch, Quincke needles used for all levels. Nerve block solution: 0.2% PF-Ropivacaine + Triamcinolone (40 mg/mL) diluted to a final concentration of 4 mg of Triamcinolone/mL of Ropivacaine The unused portion of  the solution was discarded in the proper designated containers.  Once the entire procedure was completed, the treated area was cleaned, making sure to leave some of the prepping solution back to take advantage of its long term bactericidal properties.   Illustration of the posterior view of the lumbar spine and the posterior neural structures. Laminae of L2 through S1 are labeled. DPRL5, dorsal primary ramus of L5; DPRS1, dorsal primary ramus of S1; DPR3, dorsal primary ramus of L3; FJ, facet (zygapophyseal) joint L3-L4; I, inferior articular process of L4; LB1, lateral branch of dorsal primary ramus of L1; IAB, inferior articular branches from L3 medial branch (supplies L4-L5 facet joint); IBP, intermediate branch plexus; MB3, medial branch of dorsal primary ramus of L3; NR3, third lumbar nerve root; S, superior articular process of L5; SAB, superior articular branches from L4 (supplies L4-5 facet joint also); TP3, transverse process of L3.  Vitals:   05/10/18 1030 05/10/18 1040 05/10/18 1050 05/10/18 1100  BP: (!) 130/94 104/62 (!) 116/106 (!) 110/98  Pulse: (!) 54     Resp: 16 14 14 17   Temp:  (!) 97.3 F (36.3 C)  97.8 F (36.6 C)  SpO2: 96% 100% 97% 98%  Weight:      Height:         Start Time: 1010 hrs. End Time: 1028 hrs.  Imaging Guidance (Spinal):  Type of Imaging Technique: Fluoroscopy Guidance (Spinal) Indication(s): Assistance in needle guidance and placement for procedures requiring needle placement in or near specific anatomical locations not easily accessible without such assistance. Exposure Time: Please see nurses notes. Contrast: None used. Fluoroscopic Guidance: I was personally present during the use of fluoroscopy. "Tunnel Vision Technique" used to obtain the best possible view of the target area. Parallax error corrected before commencing the procedure. "Direction-depth-direction" technique used to introduce the needle under continuous pulsed fluoroscopy.  Once target was reached, antero-posterior, oblique, and lateral fluoroscopic projection used confirm needle placement in all planes. Images permanently stored in EMR.      Interpretation: No contrast injected. I personally interpreted the imaging intraoperatively. Adequate needle placement confirmed in multiple planes. Permanent images saved into the patient's record.  Antibiotic Prophylaxis:   Anti-infectives (From admission, onward)   None     Indication(s): None identified  Post-operative Assessment:  Post-procedure Vital Signs:  Pulse/HCG Rate: (!) 54(!) 54 Temp: 97.8 F (36.6 C) Resp: 17 BP: (!) 110/98 SpO2: 98 %  EBL: None  Complications: No immediate post-treatment complications observed by team, or reported by patient.  Note: The patient tolerated the entire procedure well. A repeat set of vitals were taken after the procedure and the patient was kept under observation following institutional policy, for this type of procedure. Post-procedural neurological assessment was performed, showing return to baseline, prior to discharge. The patient was provided with post-procedure discharge instructions, including a section on how to identify potential problems. Should any problems arise concerning this procedure, the patient was given instructions to immediately contact us, at any time, without hesitation. In any case, we plan to contact the patient by telephone for a follow-up status report regarding this interventional procedure.  Comments:  Significant pressure and resistance to injection, secondary to scar tissue, at the level of the L4, L5, bilaterally.  Plan of Care  NOTE: Permanently discontinue opioid analgesics on this patient.   Imaging Orders     DG C-Arm 1-60 Min-No Report  Procedure Orders     LUMBAR FACET(MEDIAL BRANCH NERVE BLOCK) MBNB  Medications ordered for procedure: Meds ordered this encounter  Medications  . lidocaine (XYLOCAINE) 2 % (with pres)  injection 400 mg  . midazolam (VERSED) 5 MG/5ML injection 1-2 mg    Make sure Flumazenil is available in the pyxis when using this medication. If oversedation occurs, administer 0.2 mg IV over 15 sec. If after 45 sec no response, administer 0.2 mg again over 1 min; may repeat at 1 min intervals; not to exceed 4 doses (1 mg)  . fentaNYL (SUBLIMAZE) injection 25-50 mcg    Make sure Narcan is available in the pyxis when using this medication. In the event of respiratory depression (RR< 8/min): Titrate NARCAN (naloxone) in increments of 0.1 to 0.2 mg IV at 2-3 minute intervals, until desired degree of reversal.  . lactated ringers infusion 1,000 mL  . ropivacaine (PF) 2 mg/mL (0.2%) (NAROPIN) injection 18 mL  . triamcinolone acetonide (KENALOG-40) injection 80 mg   Medications administered: We administered lidocaine, midazolam, fentaNYL, lactated ringers, ropivacaine (PF) 2 mg/mL (0.2%), and triamcinolone acetonide.  See the medical record for exact dosing, route, and time of administration.  Disposition: Discharge home  Discharge Date & Time: 05/10/2018; 1101 hrs.   Physician-requested Follow-up: Return for post-procedure eval (2 wks), w/ Dr. Dossie Arbour.  Future Appointments  Date Time Provider [place]  05/28/2018 10:15 AM Milinda Pointer, MD [facility] None  07/02/2018 10:40 AM Lavon Paganini  Jerilynn Mages, MD [facility] None   Primary Care Physician: Virginia Crews, MD Location: [facility] Outpatient [facility] Management Facility Note by: Gaspar Cola, MD Date: 05/10/2018; Time: 9:19 AM  Disclaimer:  Medicine is not an exact science. The only guarantee in medicine is that nothing is guaranteed. It is important to note that the decision to proceed with this intervention was based on the information collected from the patient. The Data and conclusions were drawn from the patient's questionnaire, the interview, and the physical examination. Because the information was provided in large part  by the patient, it cannot be guaranteed that it has not been purposely or unconsciously manipulated. Every effort has been made to obtain as much relevant data as possible for this evaluation. It is important to note that the conclusions that lead to this procedure are derived in large part from the available data. Always take into account that the treatment will also be dependent on availability of resources and existing treatment guidelines, considered by other Pain Management Practitioners as being common knowledge and practice, at the time of the intervention. For Medico-Legal purposes, it is also important to point out that variation in procedural techniques and pharmacological choices are the acceptable norm. The indications, contraindications, technique, and results of the above procedure should only be interpreted and judged by a Board-Certified Interventional Pain Specialist with extensive familiarity and expertise in the same exact procedure and technique.

## 2018-05-10 NOTE — Patient Instructions (Signed)
____________________________________________________________________________________________  Post-Procedure Discharge Instructions  Instructions:  Apply ice: Fill a plastic sandwich bag with crushed ice. Cover it with a small towel and apply to injection site. Apply for 15 minutes then remove x 15 minutes. Repeat sequence on day of procedure, until you go to bed. The purpose is to minimize swelling and discomfort after procedure.  Apply heat: Apply heat to procedure site starting the day following the procedure. The purpose is to treat any soreness and discomfort from the procedure.  Food intake: Start with clear liquids (like water) and advance to regular food, as tolerated.   Physical activities: Keep activities to a minimum for the first 8 hours after the procedure.   Driving: If you have received any sedation, you are not allowed to drive for 24 hours after your procedure.  Blood thinner: Restart your blood thinner 6 hours after your procedure. (Only for those taking blood thinners)  Insulin: As soon as you can eat, you may resume your normal dosing schedule. (Only for those taking insulin)  Infection prevention: Keep procedure site clean and dry.  Post-procedure Pain Diary: Extremely important that this be done correctly and accurately. Recorded information will be used to determine the next step in treatment.  Pain evaluated is that of treated area only. Do not include pain from an untreated area.  Complete every hour, on the hour, for the initial 8 hours. Set an alarm to help you do this part accurately.  Do not go to sleep and have it completed later. It will not be accurate.  Follow-up appointment: Keep your follow-up appointment after the procedure. Usually 2 weeks for most procedures. (6 weeks in the case of radiofrequency.) Bring you pain diary.   Expect:  From numbing medicine (AKA: Local Anesthetics): Numbness or decrease in pain.  Onset: Full effect within 15  minutes of injected.  Duration: It will depend on the type of local anesthetic used. On the average, 1 to 8 hours.   From steroids: Decrease in swelling or inflammation. Once inflammation is improved, relief of the pain will follow.  Onset of benefits: Depends on the amount of swelling present. The more swelling, the longer it will take for the benefits to be seen. In some cases, up to 10 days.  Duration: Steroids will stay in the system x 2 weeks. Duration of benefits will depend on multiple posibilities including persistent irritating factors.  Occasional side-effects: Facial flushing, cramps (if present, drink Gatorade and take over-the-counter Magnesium 450-500 mg once to twice a day).  From procedure: Some discomfort is to be expected once the numbing medicine wears off. This should be minimal if ice and heat are applied as instructed.  Call if:  You experience numbness and weakness that gets worse with time, as opposed to wearing off.  New onset bowel or bladder incontinence. (This applies to Spinal procedures only)  Emergency Numbers:  Durning business hours (Monday - Thursday, 8:00 AM - 4:00 PM) (Friday, 9:00 AM - 12:00 Noon): [phone]  After hours: [phone] ____________________________________________________________________________________________    

## 2018-05-11 ENCOUNTER — Telehealth: Payer: Self-pay

## 2018-05-11 NOTE — Telephone Encounter (Signed)
Post procedure phone call.  Patient states she is doing well.  

## 2018-05-24 NOTE — Progress Notes (Deleted)
Patient's Name: Keith Gibbs  MRN: [id number]  Referring Provider: Virginia Crews, MD  DOB: 1961-02-16  PCP: Virginia Crews, MD  DOS: 05/28/2018  Note by: Gaspar Cola, MD  Service setting: Ambulatory outpatient  Specialty: Interventional Pain Management  Location: [facility] (AMB) Pain Management Facility    Patient type: Established   Primary Reason(s) for Visit: Encounter for post-procedure evaluation of chronic illness with mild to moderate exacerbation CC: No chief complaint on file.  HPI  Keith Gibbs is a 57 y.o. year old, male patient, who comes today for a post-procedure evaluation. He has Schizophrenia ([place]); COPD (chronic obstructive pulmonary disease) ([place]); Chronic hepatitis C ([place]); GERD (gastroesophageal reflux disease); Abnormal ejaculation; Chest pain, unspecified; Bipolar 1 disorder ([place]); Chest pain; Severe recurrent major depression without psychotic features ([place]); Coronary artery disease; Alcohol use disorder, moderate, dependence ([place]); Cannabis use disorder, moderate, dependence ([place]); Overdose of medication, intentional self-harm, sequela ([place]); Tobacco use disorder; Chronic knee pain (Primary Area of Pain)  (Bilateral) (R>L); Chronic low back pain (Secondary Area of Pain) (Bilateral); Chronic hip pain (Tertiary Area of Pain) (Bilateral) (R>L); Chronic shoulder pain (Fourth Area of Pain) (Right); Spondylosis without myelopathy or radiculopathy, cervical region; Chronic pain syndrome; Opiate use; Pharmacologic therapy; Disorder of skeletal system; Problems influencing health status; 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); Chronic neck pain (Fifth Area of Pain) (Midline); Chronic musculoskeletal pain; 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); Lumbar facet syndrome (Bilateral); Spondylosis without myelopathy or radiculopathy, lumbosacral region; Osteoarthritis of shoulder (Right); Osteoarthritis of acromioclavicular joint (Right); 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 His primarily concern today is the No chief complaint on file.  Pain Assessment: Location:     Radiating:   Onset:   Duration:   Quality:   Severity:  /10 (subjective, self-reported pain score)  Note: Reported level is compatible with observation.                         When using our objective Pain Scale, levels between 6 and 10/10 are said to belong in an emergency room, as it progressively worsens from a 6/10, described as severely limiting, requiring emergency care not usually available at an outpatient pain management facility. At a 6/10 level, communication becomes difficult and requires great effort. Assistance to reach the emergency department may be required. Facial flushing and profuse sweating along with potentially dangerous increases in heart rate and blood pressure will be evident. Effect on ADL:   Timing:   Modifying factors:   BP:    HR:    Keith Gibbs comes in today for post-procedure evaluation.  Further details on both, my assessment(s), as well as the proposed treatment plan, please see below.  Post-Procedure Assessment  05/10/2018 Procedure: Diagnostic bilateral lumbar facet block #1 under fluoroscopic guidance and IV sedation Pre-procedure pain score:  6/10 Post-procedure pain score: 0/10 (100% relief) Influential Factors: BMI:   Intra-procedural challenges: None observed.         Assessment challenges: None detected.               Reported side-effects:  None.        Post-procedural adverse reactions or complications: None reported         Sedation: Sedation provided. When no sedatives are used, the analgesic levels obtained are directly associated to the effectiveness of the local anesthetics. However, when sedation is provided, the level of analgesia obtained during the initial 1 hour following the intervention, is believed to be the result of a combination of factors. These factors may include, but are not limited to: 1. The effectiveness of the local anesthetics used. 2. The effects of the analgesic(s) and/or anxiolytic(s) used. 3. The degree of discomfort experienced by the patient at the time of the procedure. 4. The patients ability and reliability in recalling and recording the events. 5. The presence and influence of possible secondary gains and/or psychosocial factors. Reported result: Relief experienced during the 1st hour after the procedure:   (Ultra-Short Term Relief)            Interpretative annotation: Clinically appropriate result. Analgesia during this period is likely to be Local Anesthetic and/or IV Sedative (Analgesic/Anxiolytic) related.          Effects of local anesthetic: The analgesic effects attained during this period are directly associated to the localized infiltration of local anesthetics and therefore cary significant diagnostic value as to the etiological location, or anatomical origin, of the pain. Expected duration of relief is directly dependent on the pharmacodynamics of the local anesthetic used. Long-acting (4-6 hours) anesthetics used.  Reported result: Relief during the next 4 to 6 hour after the procedure:   (Short-Term Relief)            Interpretative annotation: Clinically appropriate result. Analgesia during this period is likely to be Local Anesthetic-related.          Long-term benefit: Defined as the period of time past the expected duration of local anesthetics (1 hour  for short-acting and 4-6 hours for long-acting). With the possible exception of prolonged sympathetic blockade from the local anesthetics, benefits during this period are typically attributed to, or associated with, other factors such as analgesic sensory neuropraxia, antiinflammatory effects, or beneficial biochemical changes provided by agents other than the local anesthetics.  Reported result: Extended relief following procedure:   (Long-Term Relief)            Interpretative annotation: Clinically possible results. Good relief. No permanent benefit expected. Inflammation plays a part in the etiology to the pain.          Current benefits: Defined as reported results that persistent at this point in time.   Analgesia: *** %            Function: Somewhat improved ROM: Somewhat improved Interpretative annotation: Recurrence of symptoms. No permanent benefit expected. Effective diagnostic intervention.          Interpretation: Results would suggest a successful diagnostic intervention.                  Plan:  Proceed with diagnostic procedure No.: 2          Laboratory Chemistry  Inflammation Markers (CRP: Acute Phase) (ESR: Chronic Phase) Lab Results  Component Value Date   CRP 24.6 (H) 09/11/2017   ESRSEDRATE 16 09/11/2017   LATICACIDVEN 1.2 02/20/2017                         Rheumatology Markers Lab Results  Component Value Date   ANA Negative 11/01/2016  Renal Markers Lab Results  Component Value Date   BUN 13 04/02/2018   CREATININE 0.99 04/02/2018   BCR 13 04/02/2018   GFRAA 97 04/02/2018   GFRNONAA 84 04/02/2018                             Hepatic Markers Lab Results  Component Value Date   AST 20 04/02/2018   ALT 21 04/02/2018   ALBUMIN 4.6 04/02/2018   HCVAB >11.0 (H) 11/01/2016                        Neuropathy Markers Lab Results  Component Value Date   VITAMINB12 457 09/11/2017   HGBA1C 5.2 12/28/2016                         Hematology Parameters Lab Results  Component Value Date   INR 1.02 11/01/2016   LABPROT 13.4 11/01/2016   PLT 186 04/02/2018   HGB 14.7 04/02/2018   HCT 41.9 04/02/2018                        CV Markers Lab Results  Component Value Date   BNP 12.0 12/26/2016   TROPONINI <0.03 02/20/2017                         Note: Lab results reviewed.  Recent Imaging Results   Results for orders placed in visit on 05/10/18  DG C-Arm 1-60 Min-No Report   Narrative Fluoroscopy was utilized by the requesting physician.  No radiographic  interpretation.    Interpretation Report: Fluoroscopy was used during the procedure to assist with needle guidance. The images were interpreted intraoperatively by the requesting physician.  Meds   Current Outpatient Medications:  .  aspirin EC 81 MG tablet, Take 1 tablet (81 mg total) by mouth daily., Disp: 30 tablet, Rfl: 1 .  atorvastatin (LIPITOR) 40 MG tablet, Take 1 tablet (40 mg total) by mouth daily at 6 PM., Disp: 30 tablet, Rfl: 1 .  cetirizine (ZYRTEC) 10 MG tablet, Take 10 mg by mouth daily., Disp: , Rfl:  .  clopidogrel (PLAVIX) 75 MG tablet, Take 75 mg by mouth daily., Disp: , Rfl:  .  DULERA 200-5 MCG/ACT AERO, TAKE 2 PUFFS BY MOUTH TWICE A DAY, Disp: 1 Inhaler, Rfl: 1 .  meloxicam (MOBIC) 7.5 MG tablet, Take 7.5 mg by mouth daily., Disp: , Rfl:  .  nitroGLYCERIN (NITROSTAT) 0.4 MG SL tablet, Place 1 tablet (0.4 mg total) under the tongue every 5 (five) minutes x 3 doses as needed for chest pain., Disp: 30 tablet, Rfl: 0 .  omeprazole (PRILOSEC) 40 MG capsule, Take 40 mg by mouth daily., Disp: , Rfl:  .  prazosin (MINIPRESS) 1 MG capsule, Take 3 capsules (3 mg total) by mouth at bedtime., Disp: 30 capsule, Rfl: 1 .  ranolazine (RANEXA) 500 MG 12 hr tablet, Take 500 mg by mouth 2 (two) times daily., Disp: , Rfl:  .  rOPINIRole (REQUIP) 0.5 MG tablet, TAKE 1 TO 2 TABLETS BY MOUTH TWICE DAILY AS NEEDED FOR RESTLESS LEGS., Disp: , Rfl: 1 .   tamsulosin (FLOMAX) 0.4 MG CAPS capsule, Take 1 capsule (0.4 mg total) by mouth daily., Disp: 30 capsule, Rfl: 1 .  ticagrelor (BRILINTA) 60 MG TABS tablet, Take 60 mg by mouth 2 (two) times daily. ,  Disp: , Rfl:   ROS  Constitutional: Denies any fever or chills Gastrointestinal: No reported hemesis, hematochezia, vomiting, or acute GI distress Musculoskeletal: Denies any acute onset joint swelling, redness, loss of ROM, or weakness Neurological: No reported episodes of acute onset apraxia, aphasia, dysarthria, agnosia, amnesia, paralysis, loss of coordination, or loss of consciousness  Allergies  Keith Gibbs is allergic to latuda [lurasidone hcl]; naproxen; and saphris [asenapine].  [place]  Drug: Keith Gibbs  reports that he has current or past drug history. Drugs: Cocaine and Marijuana. Alcohol:  reports that he drinks about 6.0 standard drinks of alcohol per week. Tobacco:  reports that he quit smoking about 2 months ago. His smoking use included cigarettes. He has a 16.00 pack-year smoking history. He has never used smokeless tobacco. Medical:  has a past medical history of Anxiety, Arthritis, Bursitis, COPD (chronic obstructive pulmonary disease) ([place]), Depression, GERD (gastroesophageal reflux disease), Hepatitis C (2012), Hypertension, Lung disease, MI (myocardial infarction) ([place]), and Stroke ([place]). Surgical: Keith Gibbs  has a past surgical history that includes Appendectomy; Shoulder surgery (Right, 04/09/2012); Back surgery; Abdominal surgery; Colonoscopy; Cardiac catheterization (Left, 06/10/2016); Cardiac catheterization (N/A, 06/10/2016); Esophagogastroduodenoscopy (egd) with propofol (N/A, 01/05/2017); Colonoscopy with propofol (N/A, 01/05/2017); Coronary angioplasty with stent; Knee arthroscopy with medial menisectomy (Right, 09/05/2017); and Spine surgery. Family: family history includes Early death in his father; Heart attack in his father; Heart disease in his father and mother;  Hypertension in his father, mother, and sister; Osteoarthritis in his mother.  Constitutional Exam  General appearance: Well nourished, well developed, and well hydrated. In no apparent acute distress There were no vitals filed for this visit. BMI Assessment: Estimated body mass index is 27.5 kg/m as calculated from the following:   Height as of 05/10/18: 6' 3"  (1.905 m).   Weight as of 05/10/18: 220 lb (99.8 kg).  BMI interpretation table: BMI level Category Range association with higher incidence of chronic pain  <18 kg/m2 Underweight   18.5-24.9 kg/m2 Ideal body weight   25-29.9 kg/m2 Overweight Increased incidence by 20%  30-34.9 kg/m2 Obese (Class I) Increased incidence by 68%  35-39.9 kg/m2 Severe obesity (Class II) Increased incidence by 136%  >40 kg/m2 Extreme obesity (Class III) Increased incidence by 254%   Patient's current BMI Ideal Body weight  There is no height or weight on file to calculate BMI. Patient weight not recorded   BMI Readings from Last 4 Encounters:  05/10/18 27.50 kg/m  04/19/18 27.12 kg/m  04/12/18 27.12 kg/m  04/02/18 27.70 kg/m   Wt Readings from Last 4 Encounters:  05/10/18 220 lb (99.8 kg)  04/19/18 217 lb (98.4 kg)  04/12/18 217 lb (98.4 kg)  04/02/18 221 lb 9.6 oz (100.5 kg)  Psych/Mental status: Alert, oriented x 3 (person, place, & time)       Eyes: PERLA Respiratory: No evidence of acute respiratory distress  Cervical Spine Area Exam  Skin & Axial Inspection: No masses, redness, edema, swelling, or associated skin lesions Alignment: Symmetrical Functional ROM: Unrestricted ROM      Stability: No instability detected Muscle Tone/Strength: Functionally intact. No obvious neuro-muscular anomalies detected. Sensory (Neurological): Unimpaired Palpation: No palpable anomalies              Upper Extremity (UE) Exam    Side: Right upper extremity  Side: Left upper extremity  Skin & Extremity Inspection: Skin color, temperature, and  hair growth are WNL. No peripheral edema or cyanosis. No masses, redness, swelling, asymmetry, or associated skin lesions. No contractures.  Skin & Extremity Inspection: Skin color, temperature, and hair growth are WNL. No peripheral edema or cyanosis. No masses, redness, swelling, asymmetry, or associated skin lesions. No contractures.  Functional ROM: Unrestricted ROM          Functional ROM: Unrestricted ROM          Muscle Tone/Strength: Functionally intact. No obvious neuro-muscular anomalies detected.  Muscle Tone/Strength: Functionally intact. No obvious neuro-muscular anomalies detected.  Sensory (Neurological): Unimpaired          Sensory (Neurological): Unimpaired          Palpation: No palpable anomalies              Palpation: No palpable anomalies              Provocative Test(s):  Phalen's test: deferred Tinel's test: deferred Apley's scratch test (touch opposite shoulder):  Action 1 (Across chest): deferred Action 2 (Overhead): deferred Action 3 (LB reach): deferred   Provocative Test(s):  Phalen's test: deferred Tinel's test: deferred Apley's scratch test (touch opposite shoulder):  Action 1 (Across chest): deferred Action 2 (Overhead): deferred Action 3 (LB reach): deferred    Thoracic Spine Area Exam  Skin & Axial Inspection: No masses, redness, or swelling Alignment: Symmetrical Functional ROM: Unrestricted ROM Stability: No instability detected Muscle Tone/Strength: Functionally intact. No obvious neuro-muscular anomalies detected. Sensory (Neurological): Unimpaired Muscle strength & Tone: No palpable anomalies  Lumbar Spine Area Exam  Skin & Axial Inspection: No masses, redness, or swelling Alignment: Symmetrical Functional ROM: Unrestricted ROM       Stability: No instability detected Muscle Tone/Strength: Functionally intact. No obvious neuro-muscular anomalies detected. Sensory (Neurological): Unimpaired Palpation: No palpable anomalies       Provocative  Tests: Hyperextension/rotation test: deferred today       Lumbar quadrant test (Kemp's test): deferred today       Lateral bending test: deferred today       Patrick's Maneuver: deferred today                   FABER test: deferred today                   S-I anterior distraction/compression test: deferred today         S-I lateral compression test: deferred today         S-I Thigh-thrust test: deferred today         S-I Gaenslen's test: deferred today          Gait & Posture Assessment  Ambulation: Unassisted Gait: Relatively normal for age and body habitus Posture: WNL   Lower Extremity Exam    Side: Right lower extremity  Side: Left lower extremity  Stability: No instability observed          Stability: No instability observed          Skin & Extremity Inspection: Skin color, temperature, and hair growth are WNL. No peripheral edema or cyanosis. No masses, redness, swelling, asymmetry, or associated skin lesions. No contractures.  Skin & Extremity Inspection: Skin color, temperature, and hair growth are WNL. No peripheral edema or cyanosis. No masses, redness, swelling, asymmetry, or associated skin lesions. No contractures.  Functional ROM: Unrestricted ROM                  Functional ROM: Unrestricted ROM                  Muscle Tone/Strength: Functionally intact. No obvious neuro-muscular anomalies detected.  Muscle Tone/Strength: Functionally intact. No obvious neuro-muscular anomalies detected.  Sensory (Neurological): Unimpaired  Sensory (Neurological): Unimpaired  Palpation: No palpable anomalies  Palpation: No palpable anomalies   Assessment  Primary Diagnosis & Pertinent Problem List: The primary encounter diagnosis was Chronic knee pain (Primary Area of Pain)  (Bilateral) (R>L). Diagnoses of Chronic low back pain (Secondary Area of Pain) (Bilateral), Chronic hip pain (Tertiary Area of Pain) (Bilateral) (R>L), Chronic shoulder pain (Fourth Area of Pain) (Right), Chronic neck  pain (Fifth Area of Pain) (Midline), Spondylosis without myelopathy or radiculopathy, lumbosacral region, Lumbar facet syndrome (Bilateral), and DDD (degenerative disc disease), lumbar were also pertinent to this visit.  Status Diagnosis  Resolved Improved Improved 1. Chronic knee pain (Primary Area of Pain)  (Bilateral) (R>L)   2. Chronic low back pain (Secondary Area of Pain) (Bilateral)   3. 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 Chronic shoulder pain (Fourth Area of Pain) (Right)   5. Chronic neck pain (Fifth Area of Pain) (Midline)   6. Spondylosis without myelopathy or radiculopathy, lumbosacral region   7. Lumbar facet syndrome (Bilateral)   8. DDD (degenerative disc disease), lumbar     Problems updated and reviewed during this visit: No problems updated. Plan of Care  Pharmacotherapy (Medications Ordered): No orders of the defined types were placed in this encounter.  Medications administered today: Keith Gibbs had no medications administered during this visit.  Procedure Orders    No procedure(s) ordered today   Lab Orders  No laboratory test(s) ordered today   Imaging Orders  No imaging studies ordered today   Referral Orders  No referral(s) requested today   Interventional management options: Planned, scheduled, and/or pending:   NOTE: Stop Plavix 7 to 10 days prior to procedures. Diagnostic bilateral lumbar facet block #2 under fluoroscopic guidance and IV sedation   Considering:   Diagnostic left intra-articularHyalganknee injectionseries Diagnostic bilateral genicular nerve block Possiblebilateral genicular nerve RFA Diagnostic lumbar facet nerve block Possible bilateral lumbar facet RFA Diagnostic bilateral hip injections Diagnosticright suprascapular nerve block Possible right suprascapular nerve RFA Diagnosticbilateral cervical facet nerve block Possible bilateral cervical RFA   Palliative PRN treatment(s):    None at this time   Provider-requested follow-up: No follow-ups on file.  Future Appointments  Date Time Provider [place]  05/28/2018 10:15 AM Milinda Pointer, MD [facility] None  07/02/2018 10:40 AM Bacigalupo, Dionne Bucy, MD [facility] None   Primary Care Physician: Virginia Crews, MD Location: [facility] Management Facility Note by: Gaspar Cola, MD Date: 05/28/2018; Time: 5:35 AM

## 2018-05-28 ENCOUNTER — Ambulatory Visit: Payer: Medicaid Other | Attending: Pain Medicine | Admitting: Pain Medicine

## 2018-06-18 ENCOUNTER — Telehealth: Payer: Self-pay | Admitting: *Deleted

## 2018-06-18 ENCOUNTER — Other Ambulatory Visit: Payer: Self-pay | Admitting: Nurse Practitioner

## 2018-06-18 DIAGNOSIS — F1210 Cannabis abuse, uncomplicated: Secondary | ICD-10-CM

## 2018-06-18 DIAGNOSIS — F1190 Opioid use, unspecified, uncomplicated: Secondary | ICD-10-CM

## 2018-06-18 DIAGNOSIS — F1490 Cocaine use, unspecified, uncomplicated: Secondary | ICD-10-CM

## 2018-06-18 DIAGNOSIS — F319 Bipolar disorder, unspecified: Secondary | ICD-10-CM

## 2018-06-18 DIAGNOSIS — F1020 Alcohol dependence, uncomplicated: Secondary | ICD-10-CM

## 2018-06-18 NOTE — Telephone Encounter (Signed)
Referral order placed.

## 2018-06-18 NOTE — Telephone Encounter (Signed)
Called patient to let him know that [place] has placed an order for referral to the requested facility.  I told patient that I would alert the front desk so that they could get this going as soon as possible.  I did tell patient that if he felt he needed help immediately he should go to the  ED and if he needed anything else to please call our office back. Patient verbalizes u/o information and is very greatful for the help.

## 2018-06-18 NOTE — Telephone Encounter (Signed)
Patient reports that he is drinking, smoking pot and using cocaine, is saying that he desperately needs help and would like a referral.

## 2018-07-02 ENCOUNTER — Ambulatory Visit: Payer: Self-pay | Admitting: Family Medicine

## 2018-07-03 ENCOUNTER — Encounter
Attending: Student in an Organized Health Care Education/Training Program | Primary: Student in an Organized Health Care Education/Training Program

## 2018-07-12 ENCOUNTER — Ambulatory Visit
Attending: Student in an Organized Health Care Education/Training Program | Primary: Student in an Organized Health Care Education/Training Program

## 2018-07-12 ENCOUNTER — Encounter
Attending: Student in an Organized Health Care Education/Training Program | Primary: Student in an Organized Health Care Education/Training Program

## 2018-07-12 ENCOUNTER — Ambulatory Visit
Admit: 2018-07-12 | Discharge: 2018-07-12 | Payer: PRIVATE HEALTH INSURANCE | Attending: Student in an Organized Health Care Education/Training Program | Primary: Student in an Organized Health Care Education/Training Program

## 2018-07-12 ENCOUNTER — Inpatient Hospital Stay: Admit: 2018-07-12 | Primary: Student in an Organized Health Care Education/Training Program

## 2018-07-12 DIAGNOSIS — G8929 Other chronic pain: Secondary | ICD-10-CM

## 2018-07-12 DIAGNOSIS — M25561 Pain in right knee: Secondary | ICD-10-CM

## 2018-07-12 MED ORDER — DICLOFENAC 1 % TOPICAL GEL
1 % | Freq: Four times a day (QID) | CUTANEOUS | 1 refills | Status: AC
Start: 2018-07-12 — End: ?

## 2018-07-12 MED ORDER — ALBUTEROL SULFATE HFA 90 MCG/ACTUATION AEROSOL INHALER
90 mcg/actuation | RESPIRATORY_TRACT | 0 refills | Status: DC | PRN
Start: 2018-07-12 — End: 2018-09-06

## 2018-07-12 NOTE — Progress Notes (Signed)
[facility]    Subjective:   David RouteJohnny Floyd is a 57 y.o. male with history of CAD, hepatitis C, COPD, GERD, PTSD, RLS, BPH  CC: establish care  History provided by patient     HPI:  [place]     Pt was diagnosed with hepatitis C about 7 years ago. He was taking an expensive medication, which cured it. He states that his hepatologist released him.    Pt has a "leaky valve" with an irregular heart beat. He had a stent placed a couple years ago. Pt states he had a coronary CT scan, which revealed a some blockage below his stent. He has had a mild MI and stroke.    Pt states that the arthritis in his hands are giving him trouble. He had a R knee arthroscopy about 4 months ago. [facility] to have it looked at. He feels that he can hardly walk on it. He had the meniscus repaired. Pt states that he has a lot of hardware in his back. He had spacers and hardware placed about 4 years ago.     When [facility] was [facility], he also had a cough. They performed a chest xray that showed bronchitis. He was prescribed a steroid pack, which has been helping him feel better. He states that his cough has really improved and that his sputum has been clear. He denies CP, SOB.    Surg: Laser surgery of R shoulder, back surgery, R knee arthroscopy    SHx: Smokes less than 1/2 PPD since 57 yo. He quit for 3 years at one time. He used to smoke 2 PPD. No alcohol, no illicit drugs.    FHx: heart disease in father. Strokes in aunts and uncles. Mother is healthy.    PFSH:     Current Outpatient Medications on File Prior to Visit   Medication Sig Dispense Refill   ??? atorvastatin (LIPITOR) 40 mg tablet Take  by mouth daily.     ??? ticagrelor (BRILINTA) 60 mg tab tablet Take  by mouth two (2) times a day.     ??? cetirizine (ZYRTEC) 10 mg tablet Take  by mouth.     ??? isosorbide dinitrate (ISORDIL) 30 mg tablet Take 20 mg by mouth four (4) times daily.     ??? meloxicam (MOBIC) 7.5 mg tablet Take  by mouth daily.      ??? omeprazole (PRILOSEC) 40 mg capsule Take 40 mg by mouth daily.     ??? clopidogrel (PLAVIX) 75 mg tab Take  by mouth.     ??? prazosin (MINIPRESS) 1 mg capsule Take  by mouth nightly.     ??? ranolazine ER (RANEXA) 1,000 mg Take  by mouth two (2) times a day.     ??? rOPINIRole (REQUIP) 0.5 mg tablet Take  by mouth three (3) times daily.     ??? tamsulosin (FLOMAX) 0.4 mg capsule Take 0.4 mg by mouth daily.       No current facility-administered medications on file prior to visit.        There is no problem list on file for this patient.      Social History     Socioeconomic History   ??? Marital status: SINGLE     Spouse name: Not on file   ??? Number of children: Not on file   ??? Years of education: Not on file   ??? Highest education level: Not on file  Occupational History   ??? Not on file   Social Needs   ??? Financial resource strain: Not on file   ??? Food insecurity:     Worry: Not on file     Inability: Not on file   ??? Transportation needs:     Medical: Not on file     Non-medical: Not on file   Tobacco Use   ??? Smoking status: Current Every Day Smoker     Types: Cigarettes   ??? Smokeless tobacco: Never Used   Substance and Sexual Activity   ??? Alcohol use: Not Currently   ??? Drug use: Not on file   ??? Sexual activity: Not on file   Lifestyle   ??? Physical activity:     Days per week: Not on file     Minutes per session: Not on file   ??? Stress: Not on file   Relationships   ??? Social connections:     Talks on phone: Not on file     Gets together: Not on file     Attends religious service: Not on file     Active member of club or organization: Not on file     Attends meetings of clubs or organizations: Not on file     Relationship status: Not on file   ??? Intimate partner violence:     Fear of current or ex partner: Not on file     Emotionally abused: Not on file     Physically abused: Not on file     Forced sexual activity: Not on file   Other Topics Concern   ??? Not on file   Social History Narrative   ??? Not on file       Review of  Systems   Constitutional: Negative for chills and fever.   HENT: Negative for congestion and sore throat.    Eyes: Negative for blurred vision and double vision.   Respiratory: Positive for cough. Negative for sputum production, shortness of breath and wheezing.    Cardiovascular: Negative for chest pain, palpitations and leg swelling.   Gastrointestinal: Negative for abdominal pain, blood in stool, heartburn, melena, nausea and vomiting.   Genitourinary: Negative for dysuria and hematuria.   Musculoskeletal: Positive for back pain and joint pain. Negative for falls, myalgias and neck pain.   Skin: Negative for itching and rash.   Neurological: Negative for dizziness and headaches.         Objective:     Visit Vitals  BP 135/84 (BP 1 Location: Left arm, BP Patient Position: Sitting)   Pulse (!) 55   Temp 97.9 ??F (36.6 ??C) (Oral)   Resp 16   Ht 6\' 3"  (1.905 m)   Wt 203 lb (92.1 kg)   SpO2 96%   BMI 25.37 kg/m??          Physical Exam  Constitutional:       General: He is not in acute distress.     Appearance: Normal appearance.   HENT:      Head: Normocephalic and atraumatic.      Right Ear: External ear normal.      Left Ear: External ear normal.      Nose: Nose normal. No congestion.      Mouth/Throat:      Mouth: Mucous membranes are moist.      Pharynx: No oropharyngeal exudate.   Eyes:      Pupils: Pupils are equal, round, and reactive to light.  Neck:      Musculoskeletal: Normal range of motion and neck supple. No neck rigidity.   Cardiovascular:      Rate and Rhythm: Normal rate and regular rhythm.      Pulses: Normal pulses.      Heart sounds: No murmur.   Pulmonary:      Effort: Pulmonary effort is normal.      Breath sounds: Normal breath sounds. No wheezing or rales.      Comments: Some soft rhonchi diffusely over left lung field  Abdominal:      General: Abdomen is flat. Bowel sounds are normal.      Palpations: Abdomen is soft.      Tenderness: There is no tenderness.   Musculoskeletal: Normal range of  motion.         General: No tenderness.      Right lower leg: No edema.      Left lower leg: No edema.      Comments: Swelling of second MCP of right hand.   Lymphadenopathy:      Cervical: No cervical adenopathy.   Skin:     General: Skin is warm and dry.      Capillary Refill: Capillary refill takes less than 2 seconds.   Neurological:      General: No focal deficit present.      Mental Status: He is alert and oriented to person, place, and time.      Sensory: No sensory deficit.         Pertinent Labs/Studies: CBC, CMP, lipid panel      Assessment and orders:       ICD-10-CM ICD-9-CM    1. Chronic pain of right knee M25.561 719.46 REFERRAL TO ORTHOPEDICS    G89.29 338.29    2. Chronic midline low back pain with bilateral sciatica M54.41 724.2 REFERRAL TO PAIN MANAGEMENT    M54.42 724.3     G89.29 338.29    3. Coronary artery disease involving native coronary artery of native heart without angina pectoris I25.10 414.01 REFERRAL TO CARDIOLOGY      LIPID PANEL      CBC W/O DIFF      METABOLIC PANEL, COMPREHENSIVE   4. Arthritis of both hands M19.041 716.94     M19.042     5. Hx of hepatitis C Z86.19 V12.09    6. Chronic obstructive pulmonary disease, unspecified COPD type (HCC) J44.9 496      Encounter Diagnoses   Name Primary?   ??? Chronic pain of right knee Yes   ??? Chronic midline low back pain with bilateral sciatica    ??? Coronary artery disease involving native coronary artery of native heart without angina pectoris    ??? Arthritis of both hands    ??? Hx of hepatitis C    ??? Chronic obstructive pulmonary disease, unspecified COPD type (HCC)      Diagnoses and all orders for this visit:    1. Chronic pain of right knee - Pt had arthroscopy about 4 months ago. Pt was seeing orthopedic physician in [place]. His knee pain has gotten worse with the moving process. Will send to orthopedics.  -     REFERRAL TO ORTHOPEDICS        -     diclofenac (VOLTAREN) 1 % gel; Apply 4 g to affected area four (4) times daily.    2. Chronic  midline low back pain with bilateral sciatica - pt was seeing pain management in the  past. No anti-inflammatories or tylenol have worked for this pain in the past. He has had back surgery about 4 years ago.  -     REFERRAL TO PAIN MANAGEMENT        -     PMP with hydrocodone on 12/7 from [facility]        -     diclofenac (VOLTAREN) 1 % gel; Apply 4 g to affected area four (4) times daily.    3. Coronary artery disease involving native coronary artery of native heart without angina pectoris - pt has stent with blockage. Will need to be plugged in with cardiologist here since he moved.   -     REFERRAL TO CARDIOLOGY  -     LIPID PANEL; Future  -     CBC W/O DIFF; Future  -     METABOLIC PANEL, COMPREHENSIVE; Future  -     Will get previous records    4. Arthritis of both hands - will provide voltaren gel for now. Need to get labs to check kidney and liver function.        -     diclofenac (VOLTAREN) 1 % gel; Apply 4 g to affected area four (4) times daily.        -     Pain management referral    5. Hx of hepatitis Cb - diagnosed seven years. Received treatment and has been cured.    6. Chronic obstructive pulmonary disease, unspecified COPD type (HCC) - pt needs refill. Pt is recovering from acute bronchitis with the steroids. Pt feels that he is improving and cough is getting better. Not in acute exacerbation.  -     albuterol (PROVENTIL HFA, VENTOLIN HFA, PROAIR HFA) 90 mcg/actuation inhaler; Take 2 Puffs by inhalation every four (4) hours as needed for Wheezing.        Follow-up and Dispositions    ?? Return if symptoms worsen or fail to improve.         I have discussed the diagnosis with the patient and the intended plan as seen in the above orders.  Social history, medical history, and labs were reviewed.  The patient has received an after-visit summary and questions were answered concerning future plans.  I have discussed medication side effects and warnings with the patient as well.    Judithann GravesAllison A Shalece Staffa,  MD  Resident [facility]  07/13/18

## 2018-07-12 NOTE — Progress Notes (Signed)
 Chief Complaint   Patient presents with   . New Order     MRI for Right knee    . Arthritis   . New Patient     Body mass index is 25.37 kg/m.    1. Have you been to the ER, urgent care clinic since your last visit?  Hospitalized since your last visit?Yes    2. Have you seen or consulted any other health care providers outside of the [facility] since your last visit?  Include any pap smears or colon screening. Yes    Reviewed record in preparation for visit and have necessary documentation  Pt did not bring medication to office visit for review  Information was given to pt on Advanced Directives, Living Will  Information was given on Shingles Vaccine  Opportunity was given for questions  Goals that were addressed and/or need to be completed after this appointment include:     Health Maintenance Due   Topic Date Due   . Hepatitis C Screening  04[birth year]   . Shingrix Vaccine Age 71> (1 of 2) 08/10/2010   . FOBT Q 1 YEAR AGE 55-75  08/10/2010     Patient declined MyChart

## 2018-07-12 NOTE — Progress Notes (Signed)
Lipid profile with total chol of 107, TG 107, HDL 37, and LDL of 60.7. CMP and CBC normal. Discussed lab results with pt at follow up visit today.    Neliah Cuyler, MD  Family Medicine Resident

## 2018-07-12 NOTE — Patient Instructions (Signed)
Iliotibial Band Syndrome: Exercises  Introduction  Here are some examples of exercises for you to try. The exercises may be suggested for a condition or for rehabilitation. Start each exercise slowly. Ease off the exercises if you start to have pain.  You will be told when to start these exercises and which ones will work best for you.  How to do the exercises  Iliotibial band stretch    1. Lean sideways against a wall. If you are not steady on your feet, hold on to a chair or counter.  2. Stand on the leg with the affected hip, with that leg close to the wall. Then cross your other leg in front of it.  3. Let your affected hip drop out to the side of your body and against the wall. Then lean away from your affected hip until you feel a stretch.  4. Hold the stretch for 15 to 30 seconds.  5. Repeat 2 to 4 times.    Piriformis stretch    1. Lie on your back with your legs straight.  2. Lift your affected leg and bend your knee. With your opposite hand, reach across your body, and then gently pull your knee toward your opposite shoulder.  3. Hold the stretch for 15 to 30 seconds.  4. Repeat 2 to 4 times.    Hamstring wall stretch    1. Lie on your back in a doorway, with your good leg through the open door.  2. Slide your affected leg up the wall to straighten your knee. You should feel a gentle stretch down the back of your leg.  1. Do not arch your back.  2. Do not bend either knee.  3. Keep one heel touching the floor and the other heel touching the wall. Do not point your toes.  3. Hold the stretch for at least 1 minute to begin. Then try to lengthen the time you hold the stretch to as long as 6 minutes.  4. Repeat 2 to 4 times.  5. If you do not have a place to do this exercise in a doorway, there is another way to do it:  6. Lie on your back, and bend the knee of your affected leg.  7. Loop a towel under the ball and toes of that foot, and hold the ends of the towel in your hands.   8. Straighten your knee, and slowly pull back on the towel. You should feel a gentle stretch down the back of your leg.  9. Hold the stretch for 15 to 30 seconds. Or even better, hold the stretch for 1 minute if you can.  10. Repeat 2 to 4 times.    Follow-up care is a key part of your treatment and safety. Be sure to make and go to all appointments, and call your doctor if you are having problems. It's also a good idea to know your test results and keep a list of the medicines you take.  Where can you learn more?  Go to [email or web address].  Enter P252 in the search box to learn more about "Iliotibial Band Syndrome: Exercises."  Current as of: January 17, 2018  Content Version: 12.2  ?? 2006-2019 [employer]. Care instructions adapted under license by [employer] (which disclaims liability or warranty for this information). If you have questions about a medical condition or this instruction, always ask your healthcare professional. [place] any warranty or liability for your use of this information.  Hand Arthritis: Exercises  Introduction  Here are some examples of exercises for you to try. The exercises may be suggested for a condition or for rehabilitation. Start each exercise slowly. Ease off the exercises if you start to have pain.  You will be told when to start these exercises and which ones will work best for you.  How to do the exercises  Tendon glides    1. In this exercise, the steps follow one another to a make a continuous movement.  2. With your affected hand, point your fingers and thumb straight up. Your wrist should be relaxed, following the line of your fingers and thumb.  3. Curl your fingers so that the top two joints in them are bent, and your fingers wrap down. Your fingertips should touch or be near the base of your fingers. Your fingers will look like a hook.   4. Make a fist by bending your knuckles. Your thumb can gently rest against your index (pointing) finger.  5. Unwind your fingers slightly so that your fingertips can touch the base of your palm. Your thumb can rest against your index finger.  6. Move back to your starting position, with your fingers and thumb pointing up.  7. Repeat the series of motions 8 to 12 times.  8. Switch hands and repeat steps 1 through 6, even if only one hand is sore.    Intrinsic flexion    1. Rest your affected hand on a table and bend the large joints where your fingers connect to your hand. Keep your thumb and the other joints in your fingers straight.  2. Slowly straighten your fingers. Your wrist should be relaxed, following the line of your fingers and thumb.  3. Move back to your starting position, with your hand bent.  4. Repeat 8 to 12 times.  5. Switch hands and repeat steps 1 through 4, even if only one hand is sore.    Finger extension    1. Place your affected hand flat on a table.  2. Lift and then lower one finger at a time off the table.  3. Repeat 8 to 12 times.  4. Switch hands and repeat steps 1 through 3, even if only one hand is sore.    MP extension    1. Place your good hand on a table, palm up. Put your affected hand on top of your good hand with your fingers wrapped around the thumb of your good hand like you are making a fist.  2. Slowly uncurl the joints of your affected hand where your fingers connect to your hand so that only the top two joints of your fingers are bent. Your fingers will look like a hook.  3. Move back to your starting position, with your fingers wrapped around your good thumb.  4. Repeat 8 to 12 times.  5. Switch hands and repeat steps 1 through 4, even if only one hand is sore.    PIP extension (with MP extension)    1. Place your good hand on a table, palm up. Put your affected hand on top of your good hand, palm up.   2. Use the thumb and fingers of your good hand to grasp below the middle joint of one finger of your affected hand.  3. Straighten the last two joints of that finger.  4. Repeat 8 to 12 times.  5. Repeat steps 1 through 4 with each finger.  6. Switch hands and repeat  steps 1 through 5, even if only one hand is sore.    DIP flexion    1. With your good hand, grasp one finger of your affected hand. Your thumb will be on the top side of your finger just below the joint that is closest to your fingernail.  2. Slowly bend your affected finger only at the joint closest to your fingernail.  3. Repeat 8 to 12 times.  4. Repeat steps 1 through 3 with each finger.  5. Switch hands and repeat steps 1 through 4, even if only one hand is sore.    Follow-up care is a key part of your treatment and safety. Be sure to make and go to all appointments, and call your doctor if you are having problems. It's also a good idea to know your test results and keep a list of the medicines you take.  Where can you learn more?  Go to [employer].  Enter [phone] in the search box to learn more about "Hand Arthritis: Exercises."  Current as of: January 17, 2018  Content Version: 12.2  ?? 2006-2019 [employer]. Care instructions adapted under license by [employer] (which disclaims liability or warranty for this information). If you have questions about a medical condition or this instruction, always ask your healthcare professional. [place] any warranty or liability for your use of this information.

## 2018-07-12 NOTE — Progress Notes (Signed)
[facility]    Subjective:   David Floyd is a 57 y.o. male with history of CAD, hepatitis C, COPD, GERD, PTSD, RLS, BPH  CC: establish care  History provided by patient     HPI:  He moved from [place].     Pt was diagnosed with hepatitis C about 7 years ago. He was taking an expensive medication, which cured it. He states that his hepatologist released him.    Pt has a "leaky valve" with an irregular heart beat. He had a stent placed a couple years ago. Pt states he had a coronary CT scan, which revealed a some blockage below his stent. He has had a mild MI and stroke.    Pt states that the arthritis in his hands are giving him trouble. He had a R knee arthroscopy about 4 months ago. He went to [facility] ED to have it looked at. He feels that he can hardly walk on it. He had the meniscus repaired. Pt states that he has a lot of hardware in his back. He had spacers and hardware placed about 4 years ago.     When he was at [facility], he also had a cough. They performed a chest xray that showed bronchitis. He was prescribed a steroid pack, which has been helping him feel better. He states that his cough has really improved and that his sputum has been clear. He denies CP, SOB.    Surg: Laser surgery of R shoulder, back surgery, R knee arthroscopy    SHx: Smokes less than 1/2 PPD since 57 yo. He quit for 3 years at one time. He used to smoke 2 PPD. No alcohol, no illicit drugs.    FHx: heart disease in father. Strokes in aunts and uncles. Mother is healthy.    PFSH:     Current Outpatient Medications on File Prior to Visit   Medication Sig Dispense Refill   ??? atorvastatin (LIPITOR) 40 mg tablet Take  by mouth daily.     ??? ticagrelor (BRILINTA) 60 mg tab tablet Take  by mouth two (2) times a day.     ??? cetirizine (ZYRTEC) 10 mg tablet Take  by mouth.     ??? isosorbide dinitrate (ISORDIL) 30 mg tablet Take 20 mg by mouth four (4) times daily.      ??? meloxicam (MOBIC) 7.5 mg tablet Take  by mouth daily.     ??? omeprazole (PRILOSEC) 40 mg capsule Take 40 mg by mouth daily.     ??? clopidogrel (PLAVIX) 75 mg tab Take  by mouth.     ??? prazosin (MINIPRESS) 1 mg capsule Take  by mouth nightly.     ??? ranolazine ER (RANEXA) 1,000 mg Take  by mouth two (2) times a day.     ??? rOPINIRole (REQUIP) 0.5 mg tablet Take  by mouth three (3) times daily.     ??? tamsulosin (FLOMAX) 0.4 mg capsule Take 0.4 mg by mouth daily.       No current facility-administered medications on file prior to visit.        There is no problem list on file for this patient.      Social History     Socioeconomic History   ??? Marital status: SINGLE     Spouse name: Not on file   ??? Number of children: Not on file   ??? Years of education: Not on file   ??? Highest education level: Not on file  Occupational History   ??? Not on file   Social Needs   ??? Financial resource strain: Not on file   ??? Food insecurity:     Worry: Not on file     Inability: Not on file   ??? Transportation needs:     Medical: Not on file     Non-medical: Not on file   Tobacco Use   ??? Smoking status: Current Every Day Smoker     Types: Cigarettes   ??? Smokeless tobacco: Never Used   Substance and Sexual Activity   ??? Alcohol use: Not Currently   ??? Drug use: Not on file   ??? Sexual activity: Not on file   Lifestyle   ??? Physical activity:     Days per week: Not on file     Minutes per session: Not on file   ??? Stress: Not on file   Relationships   ??? Social connections:     Talks on phone: Not on file     Gets together: Not on file     Attends religious service: Not on file     Active member of club or organization: Not on file     Attends meetings of clubs or organizations: Not on file     Relationship status: Not on file   ??? Intimate partner violence:     Fear of current or ex partner: Not on file     Emotionally abused: Not on file     Physically abused: Not on file     Forced sexual activity: Not on file   Other Topics Concern    ??? Not on file   Social History Narrative   ??? Not on file       Review of Systems   Constitutional: Negative for chills and fever.   HENT: Negative for congestion and sore throat.    Eyes: Negative for blurred vision and double vision.   Respiratory: Positive for cough. Negative for sputum production, shortness of breath and wheezing.    Cardiovascular: Negative for chest pain, palpitations and leg swelling.   Gastrointestinal: Negative for abdominal pain, blood in stool, heartburn, melena, nausea and vomiting.   Genitourinary: Negative for dysuria and hematuria.   Musculoskeletal: Positive for back pain and joint pain. Negative for falls, myalgias and neck pain.   Skin: Negative for itching and rash.   Neurological: Negative for dizziness and headaches.         Objective:     Visit Vitals  BP 135/84 (BP 1 Location: Left arm, BP Patient Position: Sitting)   Pulse (!) 55   Temp 97.9 ??F (36.6 ??C) (Oral)   Resp 16   Ht 6\' 3"  (1.905 m)   Wt 203 lb (92.1 kg)   SpO2 96%   BMI 25.37 kg/m??          Physical Exam  Constitutional:       General: He is not in acute distress.     Appearance: Normal appearance.   HENT:      Head: Normocephalic and atraumatic.      Right Ear: External ear normal.      Left Ear: External ear normal.      Nose: Nose normal. No congestion.      Mouth/Throat:      Mouth: Mucous membranes are moist.      Pharynx: No oropharyngeal exudate.   Eyes:      Pupils: Pupils are equal, round, and reactive to light.  Neck:      Musculoskeletal: Normal range of motion and neck supple. No neck rigidity.   Cardiovascular:      Rate and Rhythm: Normal rate and regular rhythm.      Pulses: Normal pulses.      Heart sounds: No murmur.   Pulmonary:      Effort: Pulmonary effort is normal.      Breath sounds: Normal breath sounds. No wheezing or rales.      Comments: Some soft rhonchi diffusely over left lung field  Abdominal:      General: Abdomen is flat. Bowel sounds are normal.      Palpations: Abdomen is soft.       Tenderness: There is no tenderness.   Musculoskeletal: Normal range of motion.         General: No tenderness.      Right lower leg: No edema.      Left lower leg: No edema.      Comments: Swelling of second MCP of right hand.   Lymphadenopathy:      Cervical: No cervical adenopathy.   Skin:     General: Skin is warm and dry.      Capillary Refill: Capillary refill takes less than 2 seconds.   Neurological:      General: No focal deficit present.      Mental Status: He is alert and oriented to person, place, and time.      Sensory: No sensory deficit.         Pertinent Labs/Studies: CBC, CMP, lipid panel      Assessment and orders:       ICD-10-CM ICD-9-CM    1. Chronic pain of right knee M25.561 719.46 REFERRAL TO ORTHOPEDICS    G89.29 338.29    2. Chronic midline low back pain with bilateral sciatica M54.41 724.2 REFERRAL TO PAIN MANAGEMENT    M54.42 724.3     G89.29 338.29    3. Coronary artery disease involving native coronary artery of native heart without angina pectoris I25.10 414.01 REFERRAL TO CARDIOLOGY      LIPID PANEL      CBC W/O DIFF      METABOLIC PANEL, COMPREHENSIVE   4. Arthritis of both hands M19.041 716.94     M19.042     5. Hx of hepatitis C Z86.19 V12.09    6. Chronic obstructive pulmonary disease, unspecified COPD type (HCC) J44.9 496      Encounter Diagnoses   Name Primary?   ??? Chronic pain of right knee Yes   ??? Chronic midline low back pain with bilateral sciatica    ??? Coronary artery disease involving native coronary artery of native heart without angina pectoris    ??? Arthritis of both hands    ??? Hx of hepatitis C    ??? Chronic obstructive pulmonary disease, unspecified COPD type (HCC)      Diagnoses and all orders for this visit:    1. Chronic pain of right knee - Pt had arthroscopy about 4 months ago. Pt was seeing orthopedic physician in [place]. His knee pain has gotten worse with the moving process. Will send to orthopedics.  -     REFERRAL TO ORTHOPEDICS         -     diclofenac (VOLTAREN) 1 % gel; Apply 4 g to affected area four (4) times daily.    2. Chronic midline low back pain with bilateral sciatica - pt was seeing pain management in the  past. No anti-inflammatories or tylenol have worked for this pain in the past. He has had back surgery about 4 years ago.  -     REFERRAL TO PAIN MANAGEMENT        -     PMP with hydrocodone on 12/7 from [facility]        -     diclofenac (VOLTAREN) 1 % gel; Apply 4 g to affected area four (4) times daily.    3. Coronary artery disease involving native coronary artery of native heart without angina pectoris - pt has stent with blockage. Will need to be plugged in with cardiologist here since he moved.   -     REFERRAL TO CARDIOLOGY  -     LIPID PANEL; Future  -     CBC W/O DIFF; Future  -     METABOLIC PANEL, COMPREHENSIVE; Future  -     Will get previous records    4. Arthritis of both hands - will provide voltaren gel for now. Need to get labs to check kidney and liver function.        -     diclofenac (VOLTAREN) 1 % gel; Apply 4 g to affected area four (4) times daily.        -     Pain management referral    5. Hx of hepatitis Cb - diagnosed seven years. Received treatment and has been cured.    6. Chronic obstructive pulmonary disease, unspecified COPD type (HCC) - pt needs refill. Pt is recovering from acute bronchitis with the steroids. Pt feels that he is improving and cough is getting better. Not in acute exacerbation.  -     albuterol (PROVENTIL HFA, VENTOLIN HFA, PROAIR HFA) 90 mcg/actuation inhaler; Take 2 Puffs by inhalation every four (4) hours as needed for Wheezing.        Follow-up and Dispositions    ?? Return if symptoms worsen or fail to improve.         I have discussed the diagnosis with the patient and the intended plan as seen in the above orders.  Social history, medical history, and labs were reviewed.  The patient has received an after-visit summary and questions  were answered concerning future plans.  I have discussed medication side effects and warnings with the patient as well.    Judithann GravesAllison A Sigrid Schwebach, MD  Resident [facility]  07/13/18

## 2018-07-12 NOTE — Progress Notes (Signed)
Chief Complaint   Patient presents with   ??? New Order     MRI for Right knee    ??? Arthritis   ??? New Patient     Body mass index is 25.37 kg/m??.    1. Have you been to the ER, urgent care clinic since your last visit?  Hospitalized since your last visit?Yes    2. Have you seen or consulted any other health care providers outside of the [facility] since your last visit?  Include any pap smears or colon screening. Yes    Reviewed record in preparation for visit and have necessary documentation  Pt did not bring medication to office visit for review  Information was given to pt on Advanced Directives, Living Will  Information was given on Shingles Vaccine  Opportunity was given for questions  Goals that were addressed and/or need to be completed after this appointment include:     Health Maintenance Due   Topic Date Due   ??? Hepatitis C Screening  07/20/1961   ??? Shingrix Vaccine Age 50> (1 of 2) 08/10/2010   ??? FOBT Q 1 YEAR AGE 50-75  08/10/2010     Patient declined MyChart

## 2018-07-12 NOTE — Progress Notes (Addendum)
I discussed the findings, assessment and plan with the resident and agree with the resident's findings and plan as documented in the resident's note. He has a complex set of medical problems.    Eliette Drumwright N. Jaziyah Gradel, M.D.

## 2018-07-12 NOTE — Progress Notes (Signed)
I discussed the findings, assessment and plan with the resident and agree with the resident's findings and plan as documented in the resident's note. He has a complex set of medical problems.    Isaac LaudSteven N. Mliss SaxSpence, M.D.

## 2018-07-12 NOTE — Progress Notes (Signed)
Lipid profile with total chol of 107, TG 107, HDL 37, and LDL of 60.7. CMP and CBC normal. Discussed lab results with pt at follow up visit today.    Esmond CamperAllison Jessie Cowher, MD  Family Medicine Resident

## 2018-07-13 LAB — COMPREHENSIVE METABOLIC PANEL
ALT: 24 U/L (ref 12–78)
AST: 13 U/L — ABNORMAL LOW (ref 15–37)
Albumin/Globulin Ratio: 1.6 (ref 1.1–2.2)
Albumin: 4.4 g/dL (ref 3.5–5.0)
Alkaline Phosphatase: 67 U/L (ref 45–117)
Anion Gap: 7 mmol/L (ref 5–15)
BUN: 8 MG/DL (ref 6–20)
Bun/Cre Ratio: 9 — ABNORMAL LOW (ref 12–20)
CO2: 25 mmol/L (ref 21–32)
Calcium: 9.1 MG/DL (ref 8.5–10.1)
Chloride: 104 mmol/L (ref 97–108)
Creatinine: 0.92 MG/DL (ref 0.70–1.30)
EGFR IF NonAfrican American: >60 mL/min/{1.73_m2} (ref >60)
GFR African American: >60 mL/min/{1.73_m2} (ref >60)
Globulin: 2.8 g/dL (ref 2.0–4.0)
Glucose: 91 mg/dL (ref 65–100)
Potassium: 4.4 mmol/L (ref 3.5–5.1)
Sodium: 136 mmol/L (ref 136–145)
Total Bilirubin: 0.7 MG/DL (ref 0.2–1.0)
Total Protein: 7.2 g/dL (ref 6.4–8.2)

## 2018-07-13 LAB — LIPID PANEL
CHOL/HDL Ratio: 3.2 (ref 0.0–5.0)
Chol/HDL Ratio: 3.2 (ref 0.0–5.0)
Cholesterol, Total: 119 MG/DL (ref <200)
Cholesterol, total: 119 MG/DL (ref <200)
HDL Cholesterol: 37 MG/DL
HDL: 37 MG/DL
LDL Calculated: 60.6 MG/DL (ref 0–100)
LDL, calculated: 60.6 MG/DL (ref 0–100)
Triglyceride: 107 MG/DL (ref <150)
Triglycerides: 107 MG/DL (ref <150)
VLDL Cholesterol Calculated: 21.4 MG/DL
VLDL, calculated: 21.4 MG/DL

## 2018-07-13 LAB — CBC
Hematocrit: 50.7 % — ABNORMAL HIGH (ref 36.6–50.3)
Hemoglobin: 16.9 g/dL (ref 12.1–17.0)
MCH: 33.9 PG (ref 26.0–34.0)
MCHC: 33.3 g/dL (ref 30.0–36.5)
MCV: 101.8 FL — ABNORMAL HIGH (ref 80.0–99.0)
MPV: 10.2 FL (ref 8.9–12.9)
NRBC Absolute: 0 10*3/uL (ref 0.00–0.01)
Nucleated RBCs: 0 PER 100 WBC
Platelets: 218 10*3/uL (ref 150–400)
RBC: 4.98 M/uL (ref 4.10–5.70)
RDW: 12.8 % (ref 11.5–14.5)
WBC: 8.6 10*3/uL (ref 4.1–11.1)

## 2018-07-13 LAB — METABOLIC PANEL, COMPREHENSIVE
A-G Ratio: 1.6 (ref 1.1–2.2)
ALT (SGPT): 24 U/L (ref 12–78)
AST (SGOT): 13 U/L — ABNORMAL LOW (ref 15–37)
Albumin: 4.4 g/dL (ref 3.5–5.0)
Alk. phosphatase: 67 U/L (ref 45–117)
Anion gap: 7 mmol/L (ref 5–15)
BUN/Creatinine ratio: 9 — ABNORMAL LOW (ref 12–20)
BUN: 8 MG/DL (ref 6–20)
Bilirubin, total: 0.7 MG/DL (ref 0.2–1.0)
CO2: 25 mmol/L (ref 21–32)
Calcium: 9.1 MG/DL (ref 8.5–10.1)
Chloride: 104 mmol/L (ref 97–108)
Creatinine: 0.92 MG/DL (ref 0.70–1.30)
GFR est AA: >60 mL/min/{1.73_m2} (ref >60)
GFR est non-AA: >60 mL/min/{1.73_m2} (ref >60)
Globulin: 2.8 g/dL (ref 2.0–4.0)
Glucose: 91 mg/dL (ref 65–100)
Potassium: 4.4 mmol/L (ref 3.5–5.1)
Protein, total: 7.2 g/dL (ref 6.4–8.2)
Sodium: 136 mmol/L (ref 136–145)

## 2018-07-13 LAB — CBC W/O DIFF
ABSOLUTE NRBC: 0 10*3/uL (ref 0.00–0.01)
HCT: 50.7 % — ABNORMAL HIGH (ref 36.6–50.3)
HGB: 16.9 g/dL (ref 12.1–17.0)
MCH: 33.9 PG (ref 26.0–34.0)
MCHC: 33.3 g/dL (ref 30.0–36.5)
MCV: 101.8 FL — ABNORMAL HIGH (ref 80.0–99.0)
MPV: 10.2 FL (ref 8.9–12.9)
NRBC: 0 PER 100 WBC
PLATELET: 218 10*3/uL (ref 150–400)
RBC: 4.98 M/uL (ref 4.10–5.70)
RDW: 12.8 % (ref 11.5–14.5)
WBC: 8.6 10*3/uL (ref 4.1–11.1)

## 2018-07-26 ENCOUNTER — Ambulatory Visit
Attending: Student in an Organized Health Care Education/Training Program | Primary: Student in an Organized Health Care Education/Training Program

## 2018-07-26 ENCOUNTER — Ambulatory Visit
Admit: 2018-07-26 | Discharge: 2018-07-26 | Payer: PRIVATE HEALTH INSURANCE | Attending: Student in an Organized Health Care Education/Training Program | Primary: Student in an Organized Health Care Education/Training Program

## 2018-07-26 ENCOUNTER — Encounter

## 2018-07-26 DIAGNOSIS — N50811 Right testicular pain: Secondary | ICD-10-CM

## 2018-07-26 MED ORDER — TRIMETHOPRIM-SULFAMETHOXAZOLE 160 MG-800 MG TAB
160-800 mg | ORAL_TABLET | Freq: Two times a day (BID) | ORAL | 0 refills | Status: DC
Start: 2018-07-26 — End: 2018-07-26

## 2018-07-26 MED ORDER — CYCLOBENZAPRINE 10 MG TAB
10 mg | ORAL_TABLET | Freq: Three times a day (TID) | ORAL | 0 refills | Status: DC | PRN
Start: 2018-07-26 — End: 2018-08-16

## 2018-07-26 MED ORDER — TRIMETHOPRIM-SULFAMETHOXAZOLE 160 MG-800 MG TAB
160-800 mg | ORAL_TABLET | Freq: Two times a day (BID) | ORAL | 0 refills | Status: DC
Start: 2018-07-26 — End: 2018-08-16

## 2018-07-26 MED ORDER — CYCLOBENZAPRINE 10 MG TAB
10 mg | ORAL_TABLET | Freq: Three times a day (TID) | ORAL | 0 refills | Status: DC | PRN
Start: 2018-07-26 — End: 2018-07-26

## 2018-07-26 NOTE — Progress Notes (Signed)
1. Have you been to the ER, urgent care clinic since your last visit?  Hospitalized since your last visit?No    2. Have you seen or consulted any other health care providers outside of the [facility] since your last visit?  Include any pap smears or colon screening. No  Reviewed record in preparation for visit and have necessary documentation  Pt did not bring medication to office visit for review  opportunity was given for questions  Goals that were addressed and/or need to be completed during or after this appointment include    Health Maintenance Due   Topic Date Due   . Hepatitis C Screening  08-18-1960   . Shingrix Vaccine Age 77> (1 of 2) 08/10/2010   . FOBT Q 1 YEAR AGE 20-75  08/10/2010

## 2018-07-26 NOTE — Progress Notes (Signed)
[facility]    Subjective:   David RouteJohnny Floyd is a 58 y.o. male with history of CAD, hepatitis C, COPD, GERD, PTSD, RLS, BPH  CC: pain  History provided by patient     HPI:  Pt is complaining of his right knee pain. The pain is getting worse. He is awaiting his appointments with orthopedics and pain management. See previous visit note for more details.    He has been having pain in his R testicle for the last two weeks. It radiates up into his groin. He states that it feels swollen. It has been sore, but there will be an intermittent sharp pain. He feels that it will get larger and then go down. He was recently lifting a lot of boxes because he just moved. He was feeling this sensation when he was lifting containers yesterday. He is not sexually active currently. He has not been for about 6 months. He denies fevers, chills. He denies dysuria, frequency, hematuria.     PFSH:     Current Outpatient Medications on File Prior to Visit   Medication Sig Dispense Refill   ??? atorvastatin (LIPITOR) 40 mg tablet Take  by mouth daily.     ??? ticagrelor (BRILINTA) 60 mg tab tablet Take  by mouth two (2) times a day.     ??? cetirizine (ZYRTEC) 10 mg tablet Take  by mouth.     ??? isosorbide dinitrate (ISORDIL) 30 mg tablet Take 20 mg by mouth four (4) times daily.     ??? meloxicam (MOBIC) 7.5 mg tablet Take  by mouth daily.     ??? omeprazole (PRILOSEC) 40 mg capsule Take 40 mg by mouth daily.     ??? clopidogrel (PLAVIX) 75 mg tab Take  by mouth.     ??? prazosin (MINIPRESS) 1 mg capsule Take  by mouth nightly.     ??? ranolazine ER (RANEXA) 1,000 mg Take  by mouth two (2) times a day.     ??? rOPINIRole (REQUIP) 0.5 mg tablet Take  by mouth three (3) times daily.     ??? tamsulosin (FLOMAX) 0.4 mg capsule Take 0.4 mg by mouth daily.     ??? albuterol (PROVENTIL HFA, VENTOLIN HFA, PROAIR HFA) 90 mcg/actuation inhaler Take 2 Puffs by inhalation every four (4) hours as needed for Wheezing. 1 Inhaler 0   ??? diclofenac (VOLTAREN) 1  % gel Apply 4 g to affected area four (4) times daily. 1 Each 1     No current facility-administered medications on file prior to visit.        There is no problem list on file for this patient.      Social History     Socioeconomic History   ??? Marital status: SINGLE     Spouse name: Not on file   ??? Number of children: Not on file   ??? Years of education: Not on file   ??? Highest education level: Not on file   Occupational History   ??? Not on file   Social Needs   ??? Financial resource strain: Not on file   ??? Food insecurity:     Worry: Not on file     Inability: Not on file   ??? Transportation needs:     Medical: Not on file     Non-medical: Not on file   Tobacco Use   ??? Smoking status: Current Every Day Smoker     Types: Cigarettes   ??? Smokeless tobacco:  Never Used   Substance and Sexual Activity   ??? Alcohol use: Not Currently   ??? Drug use: Not on file   ??? Sexual activity: Not on file   Lifestyle   ??? Physical activity:     Days per week: Not on file     Minutes per session: Not on file   ??? Stress: Not on file   Relationships   ??? Social connections:     Talks on phone: Not on file     Gets together: Not on file     Attends religious service: Not on file     Active member of club or organization: Not on file     Attends meetings of clubs or organizations: Not on file     Relationship status: Not on file   ??? Intimate partner violence:     Fear of current or ex partner: Not on file     Emotionally abused: Not on file     Physically abused: Not on file     Forced sexual activity: Not on file   Other Topics Concern   ??? Not on file   Social History Narrative   ??? Not on file       Review of Systems   Constitutional: Negative for chills and fever.   Gastrointestinal: Negative for abdominal pain, nausea and vomiting.   Genitourinary: Negative for dysuria, flank pain, frequency, hematuria and urgency.        R testicular pain radiating to groin.   Musculoskeletal: Positive for back pain and joint pain. Negative for myalgias and  neck pain.   Neurological: Negative for dizziness and headaches.         Objective:     Visit Vitals  BP 114/70 (BP 1 Location: Right arm, BP Patient Position: Sitting)   Pulse 78   Temp 98.3 ??F (36.8 ??C) (Oral)   Resp 18   Ht 6\' 4"  (1.93 m)   Wt 204 lb (92.5 kg)   SpO2 98%   BMI 24.83 kg/m??          Physical Exam  Constitutional:       General: He is not in acute distress.     Appearance: Normal appearance. He is not ill-appearing or toxic-appearing.   Abdominal:      General: Abdomen is flat. Bowel sounds are normal.      Palpations: Abdomen is soft.      Tenderness: There is no tenderness.      Comments: No inguinal hernia palpated on R side at rest or with valsalva.   Genitourinary:     Penis: Normal.       Scrotum/Testes: Normal.      Comments: Tenderness to palpation of R epididymus. No cysts or masses palpated. Enlarged R inguinal lymph node palpated, measuring about 1.5 cm in length. Mobile.   Musculoskeletal: Normal range of motion.         General: Swelling and tenderness present. No deformity.      Right lower leg: No edema.      Left lower leg: No edema.      Comments: Tenderness to palpation of medial and lateral joint lines of right knee. Swelling present. No erythema. No ligamentous instability.   Skin:     General: Skin is warm and dry.      Findings: No erythema.   Neurological:      Mental Status: He is alert.         Pertinent Labs/Studies:  will get R groin and testicular ultrasound      Assessment and orders:       ICD-10-CM ICD-9-CM    1. Testicular pain, right N50.811 608.9 CHLAMYDIA/GC PCR      US SCROTUM/TESTICLES   2. Inguinal lymphadenopathy R59.0 785.6 CHLAMYDIA/GC PCR      US SCROTUM/TESTICLES   3. Chronic pain of right knee M25.561 719.46     G89.29 338.29      Encounter Diagnoses   Name Primary?   ??? Testicular pain, right Yes   ??? Inguinal lymphadenopathy    ??? Chronic pain of right knee      Diagnoses and all orders for this visit:    1. Testicular pain, right - suspect epididymitis  given posterior testicular tenderness on exam. Exam and presentation is not concerning for torsion. Did not appreciate inguinal hernia on exam. Will get ultrasound to ensure that there is no hernia and to get better look at inguinal lymph node. Pathogen causing epididymitis in his age group is most like e. Coli, but will test for STI given sexual activity about 6 months ago. Will give empiric antibiotics to cover for gram negative.   -     CHLAMYDIA/GC PCR; Future  -     US SCROTUM/TESTICLES; Future        -     trimethoprim-sulfamethoxazole (BACTRIM DS, SEPTRA DS) 160-800 mg per tablet; Take 1 Tab by mouth two (2) times a day for 10 days.    2. Inguinal lymphadenopathy - Could certainly be related to epididymitis, but will get ultrasound to ensure. Will have follow up to make sure lymph node resolves. Testing for STI by urine PCR.  -     CHLAMYDIA/GC PCR; Future  -     US SCROTUM/TESTICLES; Future    3. Chronic pain of right knee - pt states that he has been getting muscle spasms in his right thigh related to his right knee pain. His pain management specialist appointment has not been set up yet and he is awaiting his orthopedic appointment. Will give small dose of flexeril to try in the meantime for some relief.  -     cyclobenzaprine (FLEXERIL) 10 mg tablet; Take 1 Tab by mouth three (3) times daily as needed for Muscle Spasm(s).      Follow-up and Dispositions    ?? Return in about 2 weeks (around 08/09/2018).             I have discussed the diagnosis with the patient and the intended plan as seen in the above orders.  Social history, medical history, and labs were reviewed.  The patient has received an after-visit summary and questions were answered concerning future plans.  I have discussed medication side effects and warnings with the patient as well.    Judithann Graves, MD  Resident [facility]  07/29/18

## 2018-07-26 NOTE — Progress Notes (Signed)
I discussed the findings, assessment and plan in detail with the resident and agree with the resident's findings and plan as documented in the resident's note.    Symiah Nowotny N. Eleanna Theilen, M.D.

## 2018-07-26 NOTE — Patient Instructions (Signed)
Swollen Lymph Nodes: Care Instructions  Your Care Instructions    Lymph nodes are small, bean-shaped glands throughout the body. They help your body fight germs and infections.  Lymph nodes often swell when there is a problem such as an injury, infection, or tumor.  ?? The nodes in your neck, under your chin, or behind your ears may swell when you have a cold or sore throat.  ?? An injury or infection in a leg or foot can make the nodes in your groin swell.  ?? Sometimes medicine can make lymph nodes swell, but this is rare.  Treatment depends on what caused your nodes to swell. Usually the nodes return to normal size without a problem.  Follow-up care is a key part of your treatment and safety. Be sure to make and go to all appointments, and call your doctor if you are having problems. It's also a good idea to know your test results and keep a list of the medicines you take.  How can you care for yourself at home?  ?? Take your medicines exactly as prescribed. Call your doctor if you think you are having a problem with your medicine.  ?? Avoid irritation.  ? Do not squeeze or pick at the lump.  ? Do not stick a needle in it.  ?? Prevent infection. Do not squeeze, drain, or puncture a painful lump. Doing this can irritate or inflame the lump, push any existing infection deeper into the skin, or cause severe bleeding.  ?? Get extra rest. Slow down just a little from your usual routine.  ?? Drink plenty of fluids, enough so that your urine is light yellow or clear like water. If you have kidney, heart, or liver disease and have to limit fluids, talk with your doctor before you increase the amount of fluids you drink.  ?? Take an over-the-counter pain medicine, such as acetaminophen (Tylenol), ibuprofen (Advil, Motrin), or naproxen (Aleve). Read and follow all instructions on the label.  ?? Do not take two or more pain medicines at the same time unless the doctor told you to. Many pain medicines have acetaminophen, which is  Tylenol. Too much acetaminophen (Tylenol) can be harmful.  When should you call for help?  Call your doctor now or seek immediate medical care if:  ?? ?? You have worse symptoms of infection, such as:  ? Increased pain, swelling, warmth, or redness.  ? Red streaks leading from the area.  ? Pus draining from the area.  ? A fever.   ??Watch closely for changes in your health, and be sure to contact your doctor if:  ?? ?? Your lymph nodes do not get smaller or do not return to normal.   ?? ?? You do not get better as expected.   Where can you learn more?  Go to [employer].  Enter [id number] in the search box to learn more about "Swollen Lymph Nodes: Care Instructions."  Current as of: December 31, 2017  Content Version: 12.2  ?? 2006-2019 [employer]. Care instructions adapted under license by [employer] (which disclaims liability or warranty for this information). If you have questions about a medical condition or this instruction, always ask your healthcare professional. [employer] disclaims any warranty or liability for your use of this information.         Testicular Pain: Care Instructions  Your Care Instructions    Pain in the testicles can be caused by many things. These include an injury to  your testicles, an infection, and testicular torsion.  Injuries and genital problems most often happen during sports or recreational activities, at work, or in a fall. Pain caused by an injury usually goes away quickly. There is usually no long-term harm to your testicles.  Infections that may cause pain include:  ?? An infection of the testicles. This is called orchitis.  ?? An abscess in the scrotum or testicles.  ?? Some sexually transmitted infections (STIs).  ?? A swelling of the tube attached to a testicle. This swelling is called epididymitis. It can cause pain and is sometimes caused by an infection.   Testicular torsion happens when a testicle twists on the spermatic cord. This cuts off the blood supply to the testicle. This is a serious condition that requires surgery.  Follow-up care is a key part of your treatment and safety. Be sure to make and go to all appointments, and call your doctor if you are having problems. It's also a good idea to know your test results and keep a list of the medicines you take.  How can you care for yourself at home?  ?? Rest and protect your testicles and groin. Stop, change, or take a break from any activity that may be causing your pain or soreness.  ?? Put ice or a cold pack on the area for 10 to 20 minutes at a time. Put a thin cloth between the ice and your skin.  ?? Wear briefs, not boxers. Briefs help support the injured area. You can use a jock strap if it helps relieve your pain.  ?? If your doctor prescribed antibiotics, take them as directed. Do not stop taking them just because you feel better. You need to take the full course of antibiotics.  ?? Ask your doctor if you can take an over-the-counter pain medicine, such as acetaminophen (Tylenol), ibuprofen (Advil, Motrin), or naproxen (Aleve). Be safe with medicines. Read and follow all instructions on the label.  ?? If the doctor gave you a prescription medicine for pain, take it as prescribed.  When should you call for help?  Call your doctor now or seek immediate medical care if:  ?? ?? You have severe or increasing pain.   ?? ?? You notice a change in how your testicles look or are positioned in your scrotum.   ?? ?? You notice new or worse swelling in your scrotum.   ?? ?? You have symptoms of a urinary problem, such as a urinary tract infection. These may include:  ? Pain or burning when you urinate.  ? A frequent need to urinate without being able to pass much urine.  ? Pain in the flank, which is just below the rib cage and above the waist on either side of the back.  ? Blood in your urine.  ? A fever.    ??Watch closely for changes in your health, and be sure to contact your doctor if:  ?? ?? You do not get better as expected.   Where can you learn more?  Go to [employer].  Enter [phone] in the search box to learn more about "Testicular Pain: Care Instructions."  Current as of: July 12, 2017  Content Version: 12.2  ?? 2006-2019 [employer]. Care instructions adapted under license by [employer] (which disclaims liability or warranty for this information). If you have questions about a medical condition or this instruction, always ask your healthcare professional. [employer] disclaims any warranty or liability for your use  of this information.

## 2018-07-26 NOTE — Progress Notes (Signed)
[facility]    Subjective:   David Floyd is a 58 y.o. male with history of CAD, hepatitis C, COPD, GERD, PTSD, RLS, BPH  CC: pain  History provided by patient     HPI:  Pt is complaining of his right knee pain. The pain is getting worse. He is awaiting his appointments with orthopedics and pain management. See previous visit note for more details.    He has been having pain in his R testicle for the last two weeks. It radiates up into his groin. He states that it feels swollen. It has been sore, but there will be an intermittent sharp pain. He feels that it will get larger and then go down. He was recently lifting a lot of boxes because he just moved. He was feeling this sensation when he was lifting containers yesterday. He is not sexually active currently. He has not been for about 6 months. He denies fevers, chills. He denies dysuria, frequency, hematuria.     PFSH:     Current Outpatient Medications on File Prior to Visit   Medication Sig Dispense Refill   ??? atorvastatin (LIPITOR) 40 mg tablet Take  by mouth daily.     ??? ticagrelor (BRILINTA) 60 mg tab tablet Take  by mouth two (2) times a day.     ??? cetirizine (ZYRTEC) 10 mg tablet Take  by mouth.     ??? isosorbide dinitrate (ISORDIL) 30 mg tablet Take 20 mg by mouth four (4) times daily.     ??? meloxicam (MOBIC) 7.5 mg tablet Take  by mouth daily.     ??? omeprazole (PRILOSEC) 40 mg capsule Take 40 mg by mouth daily.     ??? clopidogrel (PLAVIX) 75 mg tab Take  by mouth.     ??? prazosin (MINIPRESS) 1 mg capsule Take  by mouth nightly.     ??? ranolazine ER (RANEXA) 1,000 mg Take  by mouth two (2) times a day.     ??? rOPINIRole (REQUIP) 0.5 mg tablet Take  by mouth three (3) times daily.     ??? tamsulosin (FLOMAX) 0.4 mg capsule Take 0.4 mg by mouth daily.     ??? albuterol (PROVENTIL HFA, VENTOLIN HFA, PROAIR HFA) 90 mcg/actuation inhaler Take 2 Puffs by inhalation every four (4) hours as needed for Wheezing. 1 Inhaler 0    ??? diclofenac (VOLTAREN) 1 % gel Apply 4 g to affected area four (4) times daily. 1 Each 1     No current facility-administered medications on file prior to visit.        There is no problem list on file for this patient.      Social History     Socioeconomic History   ??? Marital status: SINGLE     Spouse name: Not on file   ??? Number of children: Not on file   ??? Years of education: Not on file   ??? Highest education level: Not on file   Occupational History   ??? Not on file   Social Needs   ??? Financial resource strain: Not on file   ??? Food insecurity:     Worry: Not on file     Inability: Not on file   ??? Transportation needs:     Medical: Not on file     Non-medical: Not on file   Tobacco Use   ??? Smoking status: Current Every Day Smoker     Types: Cigarettes   ??? Smokeless tobacco:  Never Used   Substance and Sexual Activity   ??? Alcohol use: Not Currently   ??? Drug use: Not on file   ??? Sexual activity: Not on file   Lifestyle   ??? Physical activity:     Days per week: Not on file     Minutes per session: Not on file   ??? Stress: Not on file   Relationships   ??? Social connections:     Talks on phone: Not on file     Gets together: Not on file     Attends religious service: Not on file     Active member of club or organization: Not on file     Attends meetings of clubs or organizations: Not on file     Relationship status: Not on file   ??? Intimate partner violence:     Fear of current or ex partner: Not on file     Emotionally abused: Not on file     Physically abused: Not on file     Forced sexual activity: Not on file   Other Topics Concern   ??? Not on file   Social History Narrative   ??? Not on file       Review of Systems   Constitutional: Negative for chills and fever.   Gastrointestinal: Negative for abdominal pain, nausea and vomiting.   Genitourinary: Negative for dysuria, flank pain, frequency, hematuria and urgency.        R testicular pain radiating to groin.    Musculoskeletal: Positive for back pain and joint pain. Negative for myalgias and neck pain.   Neurological: Negative for dizziness and headaches.         Objective:     Visit Vitals  BP 114/70 (BP 1 Location: Right arm, BP Patient Position: Sitting)   Pulse 78   Temp 98.3 ??F (36.8 ??C) (Oral)   Resp 18   Ht 6\' 4"  (1.93 m)   Wt 204 lb (92.5 kg)   SpO2 98%   BMI 24.83 kg/m??          Physical Exam  Constitutional:       General: He is not in acute distress.     Appearance: Normal appearance. He is not ill-appearing or toxic-appearing.   Abdominal:      General: Abdomen is flat. Bowel sounds are normal.      Palpations: Abdomen is soft.      Tenderness: There is no tenderness.      Comments: No inguinal hernia palpated on R side at rest or with valsalva.   Genitourinary:     Penis: Normal.       Scrotum/Testes: Normal.      Comments: Tenderness to palpation of R epididymus. No cysts or masses palpated. Enlarged R inguinal lymph node palpated, measuring about 1.5 cm in length. Mobile.   Musculoskeletal: Normal range of motion.         General: Swelling and tenderness present. No deformity.      Right lower leg: No edema.      Left lower leg: No edema.      Comments: Tenderness to palpation of medial and lateral joint lines of right knee. Swelling present. No erythema. No ligamentous instability.   Skin:     General: Skin is warm and dry.      Findings: No erythema.   Neurological:      Mental Status: He is alert.         Pertinent Labs/Studies:  will get R groin and testicular ultrasound      Assessment and orders:       ICD-10-CM ICD-9-CM    1. Testicular pain, right N50.811 608.9 CHLAMYDIA/GC PCR      US SCROTUM/TESTICLES   2. Inguinal lymphadenopathy R59.0 785.6 CHLAMYDIA/GC PCR      US SCROTUM/TESTICLES   3. Chronic pain of right knee M25.561 719.46     G89.29 338.29      Encounter Diagnoses   Name Primary?   ??? Testicular pain, right Yes   ??? Inguinal lymphadenopathy    ??? Chronic pain of right knee       Diagnoses and all orders for this visit:    1. Testicular pain, right - suspect epididymitis given posterior testicular tenderness on exam. Exam and presentation is not concerning for torsion. Did not appreciate inguinal hernia on exam. Will get ultrasound to ensure that there is no hernia and to get better look at inguinal lymph node. Pathogen causing epididymitis in his age group is most like e. Coli, but will test for STI given sexual activity about 6 months ago. Will give empiric antibiotics to cover for gram negative.   -     CHLAMYDIA/GC PCR; Future  -     US SCROTUM/TESTICLES; Future        -     trimethoprim-sulfamethoxazole (BACTRIM DS, SEPTRA DS) 160-800 mg per tablet; Take 1 Tab by mouth two (2) times a day for 10 days.    2. Inguinal lymphadenopathy - Could certainly be related to epididymitis, but will get ultrasound to ensure. Will have follow up to make sure lymph node resolves. Testing for STI by urine PCR.  -     CHLAMYDIA/GC PCR; Future  -     US SCROTUM/TESTICLES; Future    3. Chronic pain of right knee - pt states that he has been getting muscle spasms in his right thigh related to his right knee pain. His pain management specialist appointment has not been set up yet and he is awaiting his orthopedic appointment. Will give small dose of flexeril to try in the meantime for some relief.  -     cyclobenzaprine (FLEXERIL) 10 mg tablet; Take 1 Tab by mouth three (3) times daily as needed for Muscle Spasm(s).      Follow-up and Dispositions    ?? Return in about 2 weeks (around 08/09/2018).             I have discussed the diagnosis with the patient and the intended plan as seen in the above orders.  Social history, medical history, and labs were reviewed.  The patient has received an after-visit summary and questions were answered concerning future plans.  I have discussed medication side effects and warnings with the patient as well.    Judithann GravesAllison A Dylin Ihnen, MD  Resident [facility]   07/29/18

## 2018-07-26 NOTE — Progress Notes (Signed)
1. Have you been to the ER, urgent care clinic since your last visit?  Hospitalized since your last visit?No    2. Have you seen or consulted any other health care providers outside of the [facility] since your last visit?  Include any pap smears or colon screening. No  Reviewed record in preparation for visit and have necessary documentation  Pt did not bring medication to office visit for review  opportunity was given for questions  Goals that were addressed and/or need to be completed during or after this appointment include    Health Maintenance Due   Topic Date Due   ??? Hepatitis C Screening  06/16/1961   ??? Shingrix Vaccine Age 50> (1 of 2) 08/10/2010   ??? FOBT Q 1 YEAR AGE 50-75  08/10/2010

## 2018-07-26 NOTE — Progress Notes (Signed)
I discussed the findings, assessment and plan in detail with the resident and agree with the resident's findings and plan as documented in the resident's note.    Keiden Deskin N. Shayde Gervacio, M.D.

## 2018-07-30 NOTE — Telephone Encounter (Signed)
Left message on pt's voice mail asking him to call the office back and communicate which areas of his body he was seeing pain management for in [place]. He has been having trouble with his R knee and I believe it was his lower back, but I want to make sure which section of his back was causing him the most pain before ordering the CT scans so that Dr. Vincent Peyer would see pt.      Esmond Camper, MD  Family Medicine Resident

## 2018-08-03 ENCOUNTER — Ambulatory Visit: Payer: MEDICAID | Primary: Student in an Organized Health Care Education/Training Program

## 2018-08-16 ENCOUNTER — Ambulatory Visit
Attending: Student in an Organized Health Care Education/Training Program | Primary: Student in an Organized Health Care Education/Training Program

## 2018-08-16 ENCOUNTER — Encounter

## 2018-08-16 ENCOUNTER — Ambulatory Visit
Admit: 2018-08-16 | Discharge: 2018-08-16 | Payer: PRIVATE HEALTH INSURANCE | Attending: Student in an Organized Health Care Education/Training Program | Primary: Student in an Organized Health Care Education/Training Program

## 2018-08-16 ENCOUNTER — Inpatient Hospital Stay: Admit: 2018-08-16 | Payer: MEDICAID | Primary: Student in an Organized Health Care Education/Training Program

## 2018-08-16 DIAGNOSIS — N5089 Other specified disorders of the male genital organs: Secondary | ICD-10-CM

## 2018-08-16 LAB — AMB POC URINALYSIS DIP STICK AUTO W/ MICRO
Bilirubin (UA POC): NEGATIVE
Bilirubin, Urine, POC: NEGATIVE
Glucose (UA POC): NEGATIVE
Glucose, Urine, POC: NEGATIVE
Ketones (UA POC): NEGATIVE
Ketones, Urine, POC: NEGATIVE
Leukocyte Esterase, Urine, POC: NEGATIVE
Leukocyte esterase (UA POC): NEGATIVE
Nitrite, Urine, POC: NEGATIVE
Nitrites (UA POC): NEGATIVE
Protein (UA POC): NEGATIVE
Protein, Urine, POC: NEGATIVE
Specific Gravity, Urine, POC: 1.015 NA (ref 1.001–1.035)
Specific gravity (UA POC): 1.015 (ref 1.001–1.035)
Urobilinogen (UA POC): 0.2 (ref 0.2–1)
Urobilinogen, POC: 0.2 (ref 0.2–1)
pH (UA POC): 5.5 (ref 4.6–8.0)
pH, Urine, POC: 5.5 NA (ref 4.6–8.0)

## 2018-08-16 MED ORDER — CYCLOBENZAPRINE 10 MG TAB
10 mg | ORAL_TABLET | Freq: Three times a day (TID) | ORAL | 0 refills | Status: DC | PRN
Start: 2018-08-16 — End: 2018-09-06

## 2018-08-16 MED ORDER — TRIMETHOPRIM-SULFAMETHOXAZOLE 160 MG-800 MG TAB
160-800 mg | ORAL_TABLET | Freq: Two times a day (BID) | ORAL | 0 refills | Status: AC
Start: 2018-08-16 — End: 2018-08-26

## 2018-08-16 NOTE — Progress Notes (Signed)
[facility]    Subjective:   David Floyd is a 58 y.o. male with history of CAD, hepatitis C, COPD, GERD, PTSD, RLS, BPH  CC: testicular swelling  History provided by patient    HPI:  Pt states that his right testicular swelling got better with the antibiotics, but when he finished the bactrim, it came right back and worse. He states that sometimes it burns when he urinates. Pt states that he has had microscopic blood in his urine in the past, but does not have gross blood. Pt endorses urgency and frequency. He states that he urinates a lot at night time. Sometimes he has a strong stream, and other times he does not. He feels like his inguinal lymph node has gotten bigger since stopping the antibiotics.    Pt states that he has not been sexually active for the last 6-9 months. He was wondering if having moisture in his pants if this would cause a sore on his penis. The sore on his penis has been present for about a week. He has not had penile discharge. There was no discharge from the lesion.    PFSH:     Current Outpatient Medications on File Prior to Visit   Medication Sig Dispense Refill   ??? atorvastatin (LIPITOR) 40 mg tablet Take  by mouth daily.     ??? ticagrelor (BRILINTA) 60 mg tab tablet Take  by mouth two (2) times a day.     ??? cetirizine (ZYRTEC) 10 mg tablet Take  by mouth.     ??? isosorbide dinitrate (ISORDIL) 30 mg tablet Take 20 mg by mouth four (4) times daily.     ??? meloxicam (MOBIC) 7.5 mg tablet Take  by mouth daily.     ??? omeprazole (PRILOSEC) 40 mg capsule Take 40 mg by mouth daily.     ??? clopidogrel (PLAVIX) 75 mg tab Take  by mouth.     ??? prazosin (MINIPRESS) 1 mg capsule Take  by mouth nightly.     ??? ranolazine ER (RANEXA) 1,000 mg Take  by mouth two (2) times a day.     ??? rOPINIRole (REQUIP) 0.5 mg tablet Take  by mouth three (3) times daily.     ??? tamsulosin (FLOMAX) 0.4 mg capsule Take 0.4 mg by mouth daily.     ??? albuterol (PROVENTIL HFA, VENTOLIN HFA, PROAIR HFA) 90  mcg/actuation inhaler Take 2 Puffs by inhalation every four (4) hours as needed for Wheezing. 1 Inhaler 0   ??? diclofenac (VOLTAREN) 1 % gel Apply 4 g to affected area four (4) times daily. 1 Each 1     No current facility-administered medications on file prior to visit.        Patient Active Problem List   Diagnosis Code   ??? Abnormal ejaculation N53.19   ??? Atherosclerosis of coronary artery I25.10   ??? Bipolar 1 disorder (HCC) F31.9   ??? BPH (benign prostatic hyperplasia) N40.0   ??? Cannabis use disorder, moderate, dependence (HCC) F12.20   ??? Cervical facet syndrome M47.812   ??? Chronic bilateral low back pain without sciatica M54.5, G89.29   ??? Chronic hepatitis C (HCC) B18.2   ??? Chronic musculoskeletal pain M79.18, G89.29   ??? Chronic pain of both knees M25.561, M25.562, G89.29   ??? Cirrhosis of liver (HCC) K74.60   ??? Cocaine abuse (HCC) F14.10   ??? COPD (chronic obstructive pulmonary disease) (HCC) J44.9   ??? GERD (gastroesophageal reflux disease) K21.9   ???  Schizophrenia (HCC) F20.9   ??? 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 Marital status: SINGLE     Spouse name: Not on file   ??? Number of children: Not on file   ??? Years of education: Not on file   ??? Highest education level: Not on file   Occupational History   ??? Not on file   Social Needs   ??? Financial resource strain: Not on file   ??? Food insecurity:     Worry: Not on file     Inability: Not on file   ??? Transportation needs:     Medical: Not on file     Non-medical: Not on file   Tobacco Use   ??? Smoking status: Current Every Day Smoker     Types: Cigarettes   ??? Smokeless tobacco: Never Used   Substance and Sexual Activity   ??? Alcohol use: Not Currently   ??? Drug use: Not on file   ??? Sexual activity: Not on file   Lifestyle   ??? Physical activity:     Days per week: Not on file     Minutes per session: Not on file   ??? Stress: Not on file   Relationships   ??? Social connections:     Talks on  phone: Not on file     Gets together: Not on file     Attends religious service: Not on file     Active member of club or organization: Not on file     Attends meetings of clubs or organizations: Not on file     Relationship status: Not on file   ??? Intimate partner violence:     Fear of current or ex partner: Not on file     Emotionally abused: Not on file     Physically abused: Not on file     Forced sexual activity: Not on file   Other Topics Concern   ??? Not on file   Social History Narrative   ??? Not on file       Review of Systems   Constitutional: Negative for chills, fever and malaise/fatigue.   Gastrointestinal: Negative for nausea and vomiting.   Genitourinary: Positive for dysuria, frequency and urgency. Negative for flank pain and hematuria.        Denies penile discharge. Endorses sore on penis.   Musculoskeletal: Positive for back pain, joint pain and myalgias. Negative for falls.   Skin: Negative for itching and rash.         Objective:     Visit Vitals  BP 137/83 (BP 1 Location: Right arm, BP Patient Position: Sitting)   Pulse (!) 59   Temp 97.7 ??F (36.5 ??C) (Oral)   Resp 20   Ht 6\' 4"  (1.93 m)   Wt 204 lb (92.5 kg)   SpO2 95%   BMI 24.83 kg/m??          Physical Exam  Constitutional:       Appearance: Normal appearance.   HENT:      Head: Normocephalic and atraumatic.   Genitourinary:     Comments: Mild right testicular swelling, similar to previous exam. Enlarged R inguinal lymph node measuring about 2 cm in length. Small, well-circumscribed, erosion on underside of penis measuring about 0.25 cm in diameter. No drainage. No discharge at urethral opening. Tenderness to palpation of erosion.  Neurological:  Mental Status: He is alert.         Pertinent Labs/Studies: will check STI testing. UA negative. Will get imaging for chronic pain.      Assessment and orders:       ICD-10-CM ICD-9-CM    1. Testicular swelling, right N50.89 608.86 REFERRAL TO UROLOGY      CHLAMYDIA/GC PCR      HIV 1/2 AG/AB, 4TH  GENERATION,W RFLX CONFIRM      RPR      HEP B SURFACE AG      HSV 1 AND 2 BY PCR   2. Chronic midline low back pain with right-sided sciatica M54.41 724.2 MRI LUMB SPINE WO CONT    G89.29 724.3 MRI THORAC SPINE WO CONT     338.29    3. Old tear of meniscus of right knee, unspecified meniscus, unspecified tear type M23.206 717.5 MRI KNEE RT WO CONT   4. Dysuria R30.0 788.1 AMB POC URINALYSIS DIP STICK AUTO W/ MICRO      CHLAMYDIA/GC PCR      HIV 1/2 AG/AB, 4TH GENERATION,W RFLX CONFIRM      RPR      HEP B SURFACE AG      HSV 1 AND 2 BY PCR   5. Penile lesion N48.9 607.89 HIV 1/2 AG/AB, 4TH GENERATION,W RFLX CONFIRM      RPR      HEP B SURFACE AG      HSV 1 AND 2 BY PCR     Encounter Diagnoses   Name Primary?   ??? Testicular swelling, right Yes   ??? Chronic midline low back pain with right-sided sciatica    ??? Old tear of meniscus of right knee, unspecified meniscus, unspecified tear type    ??? Dysuria    ??? Penile lesion      Diagnoses and all orders for this visit:    1. Testicular swelling, right - pt continues to have pain in the same area as the previous visit. As it initially improved symptoms, but then got worse again after antibiotic therapy, will extend current antibiotics and send for chlamydia and gonorrhea PCR. Given concurrent penile lesion, will perform full STI screening. Symptoms are still consistent with epididymitis, but could be more complicated picture, so will send to urology for evaluation.  -     REFERRAL TO UROLOGY  -     CHLAMYDIA/GC PCR; Future  -     HIV 1/2 AG/AB, 4TH GENERATION,W RFLX CONFIRM; Future  -     RPR; Future  -     HEP B SURFACE AG; Future  -     HSV 1 AND 2 BY PCR; Future        -     trimethoprim-sulfamethoxazole (BACTRIM DS, SEPTRA DS) 160-800 mg per tablet; Take 1 Tab by mouth two (2) times a day for 10 days.    2. Chronic midline low back pain with right-sided sciatica - pt requires high quality imaging of back to be accepted for pain management clinic. He has history of  surgeries and hardware placement in his back.   -     MRI LUMB SPINE WO CONT; Future  -     MRI [facility] SPINE WO CONT; Future    3. Old tear of meniscus of right knee, unspecified meniscus, unspecified tear type - received records from previous orthopedic office in [place]. Pt has meniscal tear with severe OA. Pt has scheduled appointment with orthopedics, but also has chronic  pain in the R knee that was treated by pain management. Will get MRI for further characterization of chronic pain. Favoring this leg has caused pt horrible muscle spasms, so will refill the flexeril.  -     MRI KNEE RT WO CONT; Future        -     cyclobenzaprine (FLEXERIL) 10 mg tablet; Take 1 Tab by mouth three (3) times daily as needed for Muscle Spasm(s).    4. Dysuria - UA without signs of infection. Symptoms could likely be secondary to urethritis. Will perform STI screening.  -     AMB POC URINALYSIS DIP STICK AUTO W/ MICRO - personally interpreted. No nitrites, LE, indicating that there is not a UTI.  -     CHLAMYDIA/GC PCR; Future  -     HIV 1/2 AG/AB, 4TH GENERATION,W RFLX CONFIRM; Future  -     RPR; Future  -     HEP B SURFACE AG; Future  -     HSV 1 AND 2 BY PCR; Future    5. 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 It is not completely clear as it is a single lesion. If it was previously a vesicle, the top layer of skin had eroded. Will perform extensive STI screening.  -     HIV 1/2 AG/AB, 4TH GENERATION,W RFLX CONFIRM; Future  -     RPR; Future  -     HEP B SURFACE AG; Future  -     HSV 1 AND 2 BY PCR; Future        Follow-up and Dispositions    ?? Return in about 2 weeks (around 08/30/2018).           I have discussed the diagnosis with the patient and the intended plan as seen in the above orders.  Social history, medical history, and labs were reviewed.  The patient has received an after-visit summary and questions were answered concerning future plans.  I have discussed medication side effects and warnings  with the patient as well.    Judithann Graves, MD  Resident [facility]  08/19/18

## 2018-08-16 NOTE — Progress Notes (Signed)
1. Have you been to the ER, urgent care clinic since your last visit?  Hospitalized since your last visit?No    2. Have you seen or consulted any other health care providers outside of the [facility] since your last visit?  Include any pap smears or colon screening. No    Reviewed record in preparation for visit and have necessary documentation  Goals that were addressed and/or need to be completed during or after this appointment include     Health Maintenance Due   Topic Date Due   ??? Shingrix Vaccine Age 50> (1 of 2) 08/10/2010   ??? FOBT Q 1 YEAR AGE 50-75  08/10/2010       Patient is accompanied by self I have received verbal consent from David Floyd to discuss any/all medical information while they are present in the room.

## 2018-08-16 NOTE — Progress Notes (Signed)
[facility]    Subjective:   David Floyd is a 58 y.o. male with history of CAD, hepatitis C, COPD, GERD, PTSD, RLS, BPH  CC: testicular swelling  History provided by patient    HPI:  Pt states that his right testicular swelling got better with the antibiotics, but when he finished the bactrim, it came right back and worse. He states that sometimes it burns when he urinates. Pt states that he has had microscopic blood in his urine in the past, but does not have gross blood. Pt endorses urgency and frequency. He states that he urinates a lot at night time. Sometimes he has a strong stream, and other times he does not. He feels like his inguinal lymph node has gotten bigger since stopping the antibiotics.    Pt states that he has not been sexually active for the last 6-9 months. He was wondering if having moisture in his pants if this would cause a sore on his penis. The sore on his penis has been present for about a week. He has not had penile discharge. There was no discharge from the lesion.    PFSH:     Current Outpatient Medications on File Prior to Visit   Medication Sig Dispense Refill   ??? atorvastatin (LIPITOR) 40 mg tablet Take  by mouth daily.     ??? ticagrelor (BRILINTA) 60 mg tab tablet Take  by mouth two (2) times a day.     ??? cetirizine (ZYRTEC) 10 mg tablet Take  by mouth.     ??? isosorbide dinitrate (ISORDIL) 30 mg tablet Take 20 mg by mouth four (4) times daily.     ??? meloxicam (MOBIC) 7.5 mg tablet Take  by mouth daily.     ??? omeprazole (PRILOSEC) 40 mg capsule Take 40 mg by mouth daily.     ??? clopidogrel (PLAVIX) 75 mg tab Take  by mouth.     ??? prazosin (MINIPRESS) 1 mg capsule Take  by mouth nightly.     ??? ranolazine ER (RANEXA) 1,000 mg Take  by mouth two (2) times a day.     ??? rOPINIRole (REQUIP) 0.5 mg tablet Take  by mouth three (3) times daily.     ??? tamsulosin (FLOMAX) 0.4 mg capsule Take 0.4 mg by mouth daily.      ??? albuterol (PROVENTIL HFA, VENTOLIN HFA, PROAIR HFA) 90 mcg/actuation inhaler Take 2 Puffs by inhalation every four (4) hours as needed for Wheezing. 1 Inhaler 0   ??? diclofenac (VOLTAREN) 1 % gel Apply 4 g to affected area four (4) times daily. 1 Each 1     No current facility-administered medications on file prior to visit.        Patient Active Problem List   Diagnosis Code   ??? Abnormal ejaculation N53.19   ??? Atherosclerosis of coronary artery I25.10   ??? Bipolar 1 disorder (HCC) F31.9   ??? BPH (benign prostatic hyperplasia) N40.0   ??? Cannabis use disorder, moderate, dependence (HCC) F12.20   ??? Cervical facet syndrome M47.812   ??? Chronic bilateral low back pain without sciatica M54.5, G89.29   ??? Chronic hepatitis C (HCC) B18.2   ??? Chronic musculoskeletal pain M79.18, G89.29   ??? Chronic pain of both knees M25.561, M25.562, G89.29   ??? Cirrhosis of liver (HCC) K74.60   ??? Cocaine abuse (HCC) F14.10   ??? COPD (chronic obstructive pulmonary disease) (HCC) J44.9   ??? GERD (gastroesophageal reflux disease) K21.9   ???  Schizophrenia (HCC) F20.9   ??? 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 Marital status: SINGLE     Spouse name: Not on file   ??? Number of children: Not on file   ??? Years of education: Not on file   ??? Highest education level: Not on file   Occupational History   ??? Not on file   Social Needs   ??? Financial resource strain: Not on file   ??? Food insecurity:     Worry: Not on file     Inability: Not on file   ??? Transportation needs:     Medical: Not on file     Non-medical: Not on file   Tobacco Use   ??? Smoking status: Current Every Day Smoker     Types: Cigarettes   ??? Smokeless tobacco: Never Used   Substance and Sexual Activity   ??? Alcohol use: Not Currently   ??? Drug use: Not on file   ??? Sexual activity: Not on file   Lifestyle   ??? Physical activity:     Days per week: Not on file     Minutes per session: Not on file    ??? Stress: Not on file   Relationships   ??? Social connections:     Talks on phone: Not on file     Gets together: Not on file     Attends religious service: Not on file     Active member of club or organization: Not on file     Attends meetings of clubs or organizations: Not on file     Relationship status: Not on file   ??? Intimate partner violence:     Fear of current or ex partner: Not on file     Emotionally abused: Not on file     Physically abused: Not on file     Forced sexual activity: Not on file   Other Topics Concern   ??? Not on file   Social History Narrative   ??? Not on file       Review of Systems   Constitutional: Negative for chills, fever and malaise/fatigue.   Gastrointestinal: Negative for nausea and vomiting.   Genitourinary: Positive for dysuria, frequency and urgency. Negative for flank pain and hematuria.        Denies penile discharge. Endorses sore on penis.   Musculoskeletal: Positive for back pain, joint pain and myalgias. Negative for falls.   Skin: Negative for itching and rash.         Objective:     Visit Vitals  BP 137/83 (BP 1 Location: Right arm, BP Patient Position: Sitting)   Pulse (!) 59   Temp 97.7 ??F (36.5 ??C) (Oral)   Resp 20   Ht 6\' 4"  (1.93 m)   Wt 204 lb (92.5 kg)   SpO2 95%   BMI 24.83 kg/m??          Physical Exam  Constitutional:       Appearance: Normal appearance.   HENT:      Head: Normocephalic and atraumatic.   Genitourinary:     Comments: Mild right testicular swelling, similar to previous exam. Enlarged R inguinal lymph node measuring about 2 cm in length. Small, well-circumscribed, erosion on underside of penis measuring about 0.25 cm in diameter. No drainage. No discharge at urethral opening. Tenderness to palpation of erosion.  Neurological:  Mental Status: He is alert.         Pertinent Labs/Studies: will check STI testing. UA negative. Will get imaging for chronic pain.      Assessment and orders:       ICD-10-CM ICD-9-CM     1. Testicular swelling, right N50.89 608.86 REFERRAL TO UROLOGY      CHLAMYDIA/GC PCR      HIV 1/2 AG/AB, 4TH GENERATION,W RFLX CONFIRM      RPR      HEP B SURFACE AG      HSV 1 AND 2 BY PCR   2. Chronic midline low back pain with right-sided sciatica M54.41 724.2 MRI LUMB SPINE WO CONT    G89.29 724.3 MRI THORAC SPINE WO CONT     338.29    3. Old tear of meniscus of right knee, unspecified meniscus, unspecified tear type M23.206 717.5 MRI KNEE RT WO CONT   4. Dysuria R30.0 788.1 AMB POC URINALYSIS DIP STICK AUTO W/ MICRO      CHLAMYDIA/GC PCR      HIV 1/2 AG/AB, 4TH GENERATION,W RFLX CONFIRM      RPR      HEP B SURFACE AG      HSV 1 AND 2 BY PCR   5. Penile lesion N48.9 607.89 HIV 1/2 AG/AB, 4TH GENERATION,W RFLX CONFIRM      RPR      HEP B SURFACE AG      HSV 1 AND 2 BY PCR     Encounter Diagnoses   Name Primary?   ??? Testicular swelling, right Yes   ??? Chronic midline low back pain with right-sided sciatica    ??? Old tear of meniscus of right knee, unspecified meniscus, unspecified tear type    ??? Dysuria    ??? Penile lesion      Diagnoses and all orders for this visit:    1. Testicular swelling, right - pt continues to have pain in the same area as the previous visit. As it initially improved symptoms, but then got worse again after antibiotic therapy, will extend current antibiotics and send for chlamydia and gonorrhea PCR. Given concurrent penile lesion, will perform full STI screening. Symptoms are still consistent with epididymitis, but could be more complicated picture, so will send to urology for evaluation.  -     REFERRAL TO UROLOGY  -     CHLAMYDIA/GC PCR; Future  -     HIV 1/2 AG/AB, 4TH GENERATION,W RFLX CONFIRM; Future  -     RPR; Future  -     HEP B SURFACE AG; Future  -     HSV 1 AND 2 BY PCR; Future        -     trimethoprim-sulfamethoxazole (BACTRIM DS, SEPTRA DS) 160-800 mg per tablet; Take 1 Tab by mouth two (2) times a day for 10 days.     2. Chronic midline low back pain with right-sided sciatica - pt requires high quality imaging of back to be accepted for pain management clinic. He has history of surgeries and hardware placement in his back.   -     MRI [facility] SPINE WO CONT; Future  -     [facility]    3. Old tear of meniscus of right knee, unspecified meniscus, unspecified tear type - received records from previous orthopedic office in [place]. Pt has meniscal tear with severe OA. Pt has scheduled appointment with orthopedics, but also has chronic  pain in the R knee that was treated by pain management. Will get MRI for further characterization of chronic pain. Favoring this leg has caused pt horrible muscle spasms, so will refill the flexeril.  -     MRI KNEE RT WO CONT; Future        -     cyclobenzaprine (FLEXERIL) 10 mg tablet; Take 1 Tab by mouth three (3) times daily as needed for Muscle Spasm(s).    4. Dysuria - UA without signs of infection. Symptoms could likely be secondary to urethritis. Will perform STI screening.  -     AMB POC URINALYSIS DIP STICK AUTO W/ MICRO - personally interpreted. No nitrites, LE, indicating that there is not a UTI.  -     CHLAMYDIA/GC PCR; Future  -     HIV 1/2 AG/AB, 4TH GENERATION,W RFLX CONFIRM; Future  -     RPR; Future  -     HEP B SURFACE AG; Future  -     HSV 1 AND 2 BY PCR; Future    5. 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 It is not completely clear as it is a single lesion. If it was previously a vesicle, the top layer of skin had eroded. Will perform extensive STI screening.  -     HIV 1/2 AG/AB, 4TH GENERATION,W RFLX CONFIRM; Future  -     RPR; Future  -     HEP B SURFACE AG; Future  -     HSV 1 AND 2 BY PCR; Future        Follow-up and Dispositions    ?? Return in about 2 weeks (around 08/30/2018).           I have discussed the diagnosis with the patient and the intended plan as seen in the above orders.  Social history, medical history, and labs were  reviewed.  The patient has received an after-visit summary and questions were answered concerning future plans.  I have discussed medication side effects and warnings with the patient as well.    Judithann Graves, MD  Resident [facility]  08/19/18

## 2018-08-16 NOTE — Progress Notes (Signed)
I discussed the findings, assessment and plan in detail with the resident and agree with the resident's findings and plan as documented in the resident's note.    Abbey Veith N. Girtie Wiersma, M.D.

## 2018-08-16 NOTE — Patient Instructions (Signed)
Learning About How to Have a Healthy Back  What causes back pain?    Back pain is often caused by overuse, strain, or injury. For example, people often hurt their backs playing sports or working in the yard, being jolted in a car accident, or lifting something too heavy.  Aging plays a part too. Your bones and muscles tend to lose strength as you age, which makes injury more likely. The spongy discs between the bones of the spine (vertebrae) may suffer from wear and tear and no longer provide enough cushion between the bones. A disc that bulges or breaks open (herniated disc) can press on nerves, causing back pain.  In some people, back pain is the result of arthritis, broken vertebrae caused by bone loss (osteoporosis), illness, or a spine problem.  Although most people have back pain at one time or another, there are steps you can take to make it less likely.  How can you have a healthy back?  Reduce stress on your back through good posture  Slumping or slouching alone may not cause low back pain. But after the back has been strained or injured, bad posture can make pain worse.  ?? Sleep in a position that maintains your back's normal curves and on a mattress that feels comfortable. Sleep on your side with a pillow between your knees, or sleep on your back with a pillow under your knees. These positions can reduce strain on your back.  ?? Stand and sit up straight. "Good posture" generally means your ears, shoulders, and hips are in a straight line.  ?? If you must stand for a long time, put one foot on a stool, ledge, or box. Switch feet every now and then.  ?? Sit in a chair that is low enough to let you place both feet flat on the floor with both knees nearly level with your hips. If your chair or desk is too high, use a footrest to raise your knees. Place a small pillow, a rolled-up towel, or a lumbar roll in the curve of your back if you need extra support.   ?? Try a kneeling chair, which helps tilt your hips forward. This takes pressure off your lower back.  ?? Try sitting on an exercise ball. It can rock from side to side, which helps keep your back loose.  ?? When driving, keep your knees nearly level with your hips. Sit straight, and drive with both hands on the steering wheel. Your arms should be in a slightly bent position.  Reduce stress on your back through careful lifting  ?? Squat down, bending at the hips and knees only. If you need to, put one knee to the floor and extend your other knee in front of you, bent at a right angle (half kneeling).  ?? Press your chest straight forward. This helps keep your upper back straight while keeping a slight arch in your low back.  ?? Hold the load as close to your body as possible, at the level of your belly button (navel).  ?? Use your feet to change direction, taking small steps.  ?? Lead with your hips as you change direction. Keep your shoulders in line with your hips as you move.  ?? Set down your load carefully, squatting with your knees and hips only.  Exercise and stretch your back  ?? Do some exercise on most days of the week, if your doctor says it is okay. You can walk, run, swim, or   cycle.  ?? Stretch your back muscles. Here are a few exercises to try:  ? Lie on your back, and gently pull one bent knee to your chest. Put that foot back on the floor, and then pull the other knee to your chest.  ? Do pelvic tilts. Lie on your back with your knees bent. Tighten your stomach muscles. Pull your belly button (navel) in and up toward your ribs. You should feel like your back is pressing to the floor and your hips and pelvis are slightly lifting off the floor. Hold for 6 seconds while breathing smoothly.  ? Sit with your back flat against a wall.  ?? Keep your core muscles strong. The muscles of your back, belly (abdomen), and buttocks support your spine.  ? Pull in your belly and imagine pulling your navel toward your spine.  Hold this for 6 seconds, then relax. Remember to keep breathing normally as you tense your muscles.  ? Do curl-ups. Always do them with your knees bent. Keep your low back on the floor, and curl your shoulders toward your knees using a smooth, slow motion. Keep your arms folded across your chest. If this bothers your neck, try putting your hands behind your neck (not your head), with your elbows spread apart.  ? Lie on your back with your knees bent and your feet flat on the floor. Tighten your belly muscles, and then push with your feet and raise your buttocks up a few inches. Hold this position 6 seconds as you continue to breathe normally, then lower yourself slowly to the floor. Repeat 8 to 12 times.  ? If you like group exercise, try Pilates or yoga. These classes have poses that strengthen the core muscles.  Lead a healthy lifestyle  ?? Stay at a healthy weight to avoid strain on your back.  ?? Do not smoke. Smoking increases the risk of osteoporosis, which weakens the spine. If you need help quitting, talk to your doctor about stop-smoking programs and medicines. These can increase your chances of quitting for good.  Where can you learn more?  Go to http://www.healthwise.net/GoodHelpConnections.  Enter L315 in the search box to learn more about "Learning About How to Have a Healthy Back."  Current as of: January 17, 2018  Content Version: 12.2  ?? 2006-2019 Healthwise, Incorporated. Care instructions adapted under license by [employer] (which disclaims liability or warranty for this information). If you have questions about a medical condition or this instruction, always ask your healthcare professional. Healthwise, Incorporated disclaims any warranty or liability for your use of this information.

## 2018-08-16 NOTE — Progress Notes (Signed)
 1. Have you been to the ER, urgent care clinic since your last visit?  Hospitalized since your last visit?No    2. Have you seen or consulted any other health care providers outside of the [facility] since your last visit?  Include any pap smears or colon screening. No    Reviewed record in preparation for visit and have necessary documentation  Goals that were addressed and/or need to be completed during or after this appointment include     Health Maintenance Due   Topic Date Due   . Shingrix Vaccine Age 22> (1 of 2) 08/10/2010   . FOBT Q 1 YEAR AGE 25-75  08/10/2010       Patient is accompanied by self I have received verbal consent from David Floyd to discuss any/all medical information while they are present in the room.

## 2018-08-16 NOTE — Progress Notes (Signed)
I discussed the findings, assessment and plan in detail with the resident and agree with the resident's findings and plan as documented in the resident's note.    Yordy Matton N. Tarina Volk, M.D.

## 2018-08-17 LAB — RPR
RPR: NONREACTIVE
RPR: NONREACTIVE

## 2018-08-17 LAB — HIV 1/2 ANTIGEN/ANTIBODY, FOURTH GENERATION W/RFL: Interpretation: NONREACTIVE

## 2018-08-17 LAB — HEPATITIS B SURFACE ANTIGEN: Hepatitis B Surface Ag: <0.1 Index

## 2018-08-17 LAB — HIV 1/2 AG/AB, 4TH GENERATION,W RFLX CONFIRM: HIV 1/2 Interpretation: NONREACTIVE

## 2018-08-17 LAB — HEP B SURFACE AG
Hep B surface Ag Interp.: NEGATIVE
Hepatitis B surface Ag: <0.1 Index

## 2018-08-19 LAB — HSV 1 AND 2 BY PCR
HSV 1, PCR: NEGATIVE
HSV 2 , PCR: NEGATIVE
HSV-1 DNA by PCR: NEGATIVE
HSV-2 DNA by PCR: NEGATIVE

## 2018-08-22 LAB — CHLAMYDIA / GC-AMPLIFIED
CHLAMYDIA TRACHOMATIS, NAA, 188078: NEGATIVE
Chlamydia trachomatis, NAA: NEGATIVE
NEISSERIA GONORRHOEAE, NAA, 188086: NEGATIVE
Neisseria gonorrhoeae, NAA: NEGATIVE

## 2018-08-23 NOTE — Telephone Encounter (Signed)
Left message for pt indicating that all his testing came back negative and that we were working on getting him an appointment with the urologist. Indicated that he should return call if he has questions.      Esmond Camper, MD  Family Medicine Resident

## 2018-08-28 ENCOUNTER — Ambulatory Visit: Payer: MEDICAID | Primary: Student in an Organized Health Care Education/Training Program

## 2018-08-28 ENCOUNTER — Inpatient Hospital Stay
Payer: MEDICAID | Attending: Student in an Organized Health Care Education/Training Program | Primary: Student in an Organized Health Care Education/Training Program

## 2018-08-30 NOTE — Telephone Encounter (Signed)
[employer] [phone] called stating that Insurance needs a Peer to Peer to be completed. MRI of the knee was denied because there was no conservative Rx.    [employer]  [phone] x 3. Provide the Case Number [id number]. Any other info please provide to [employer] to overturn the denial. He is scheduled for the MRI on Sunday February 9th.

## 2018-08-31 NOTE — Telephone Encounter (Signed)
Had peer to peer discussion when calling [employer]. Explained that pt had previously had conservative management and procedures with ortho in [place] and required MRI to further evaluate R meniscus. [employer] physician approved MRI with confirmation number: [id number].    Esmond Camper, MD  Family Medicine Resident

## 2018-09-02 ENCOUNTER — Inpatient Hospital Stay
Admit: 2018-09-02 | Payer: MEDICAID | Attending: Student in an Organized Health Care Education/Training Program | Primary: Student in an Organized Health Care Education/Training Program

## 2018-09-02 ENCOUNTER — Inpatient Hospital Stay
Payer: MEDICAID | Attending: Student in an Organized Health Care Education/Training Program | Primary: Student in an Organized Health Care Education/Training Program

## 2018-09-02 DIAGNOSIS — G8929 Other chronic pain: Secondary | ICD-10-CM

## 2018-09-02 DIAGNOSIS — R590 Localized enlarged lymph nodes: Secondary | ICD-10-CM

## 2018-09-02 NOTE — Progress Notes (Signed)
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 Will send results to Dr. Vincent Peyer so that pt can be placed in pain management clinic. Thoracic MRI with mild degenerative changes.      Esmond Camper, MD  Family Medicine Resident

## 2018-09-02 NOTE — Progress Notes (Signed)
Ultrasound of scrotum without evidence of epididymitis or testicular abnormalities. It did not show hernia or lymphadenopathy either. Discussed at office visit today.      Esmond Camper, MD  Family Medicine Resident

## 2018-09-04 ENCOUNTER — Ambulatory Visit
Attending: Student in an Organized Health Care Education/Training Program | Primary: Student in an Organized Health Care Education/Training Program

## 2018-09-04 ENCOUNTER — Ambulatory Visit
Admit: 2018-09-04 | Discharge: 2018-09-04 | Payer: PRIVATE HEALTH INSURANCE | Attending: Student in an Organized Health Care Education/Training Program | Primary: Student in an Organized Health Care Education/Training Program

## 2018-09-04 DIAGNOSIS — N489 Disorder of penis, unspecified: Secondary | ICD-10-CM

## 2018-09-04 NOTE — Progress Notes (Signed)
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 Will send results to Dr. Saleeby so that pt can be placed in pain management clinic. Thoracic MRI with mild degenerative changes.      David Revoir, MD  Family Medicine Resident

## 2018-09-04 NOTE — Progress Notes (Signed)
Ultrasound of scrotum without evidence of epididymitis or testicular abnormalities. It did not show hernia or lymphadenopathy either. Discussed at office visit today.      David Haile, MD  Family Medicine Resident

## 2018-09-04 NOTE — Patient Instructions (Addendum)
September 04, 2018  [facility]   [address]  [place] [address]      Dear David Floyd:    Thank you for requesting access to MyChart. Please follow the instructions below to view your test results, access and download parts of your medical record, view details of your past and upcoming appointments, and view your medications online.       How Do I Sign Up?              1. In your internet browser, go to [email or web address]  2. Click on the First Time User? Click Here link in the Sign In box. You will see the New Member Sign Up page.  3. Enter your MyChart Access Code exactly as it appears below. You will not need to use this code after you???ve completed the sign-up process. If you do not sign up before the expiration date, you must request a new code.    ?? MyChart Access Code: [email or web address]  ?? Expires: 10/01/2018  6:06 PM    4. Enter the last four digits of your Social Security Number (xxxx) and Date of Birth (mm/dd/yyyy) as indicated and click Submit. You will be taken to the next sign-up page.  5. Create a MyChart ID. This will be your MyChart login ID and cannot be changed, so think of one that is secure and easy to remember.  6. Create a MyChart password. You can change your password at any time.  7. Enter your Password Reset Question and Answer. This can be used at a later time if you forget your password.   8. Enter your e-mail address. You will receive e-mail notification when new information is available in MyChart.  9. Click Sign Up. You can now view and download portions of your medical record.  10. Click the Download Summary menu link to download a portable copy of your medical information.       Additional Information:              If you require any assistance or have any questions, please contact our MyChart Helpdesk at [phone], email at [email or web address] or check online in our Frequently Asked Questions.     Remember, MyChart is NOT to be used for urgent needs. For medical emergencies, dial 911.    Now available from your iPhone and Android!    Sincerely,   [name]       new to my practice     Learning About Joint Injections  What are joint injections?    Joint injections are shots into a joint, such as the knee or shoulder. They are used to put in medicines, such as pain relievers and steroid medicines.  Steroids can be injected directly into a swollen and painful joint to reduce inflammation. A steroid shot can sometimes help with short-term pain relief when other treatments haven't worked. If steroid shots help, pain may improve for weeks or months.  How are they done?  First, the area over the joint will be cleaned. Your doctor may then use a tiny needle to numb the skin in the area where you will get the joint injection.  If a tiny needle is used to numb the area, your doctor will use another needle to inject the medicine. Your doctor may use a pain reliever, a steroid, or both. You may feel some pressure or discomfort.  The procedure takes 10 to 30 minutes. But the injection itself usually takes only a  few minutes.  Your doctor may put ice on the area before you go home. You will probably go home soon after your shot.  What can you expect after a joint injection?  You may have numbness around the joint for a few hours.  If your shot included both a pain reliever and a steroid, then the pain will probably go away right away. But it might come back after a few hours. This might happen if the pain reliever wears off and the steroid hasn't started to work yet. Steroids don't always work. But when they do, the pain relief can last for several days to a few months or longer.  Your doctor may tell you to use ice on the area. You can also use ice if the pain comes back. Put ice or a cold pack on your joint for 10 to 20 minutes at a time. Put a thin cloth between the ice and your skin.   Follow your doctor's instructions carefully.  Follow-up care is a key part of your treatment and safety. Be sure to make and go to all appointments, and call your doctor if you are having problems. It's also a good idea to know your test results and keep a list of the medicines you take.  Where can you learn more?  Go to InsuranceStats.ca.  Enter [phone] in the search box to learn more about "Learning About Joint Injections."  Current as of: January 17, 2018  Content Version: 12.2  ?? 2006-2019 Healthwise, Incorporated. Care instructions adapted under license by [facility] (which disclaims liability or warranty for this information). If you have questions about a medical condition or this instruction, always ask your healthcare professional. Healthwise, Incorporated disclaims any warranty or liability for your use of this information.

## 2018-09-04 NOTE — Progress Notes (Signed)
I reviewed with the resident the medical history and the resident's findings on the physical examination.  I discussed with the resident the patient's diagnosis and concur with the plan.

## 2018-09-04 NOTE — Progress Notes (Signed)
[facility]    Subjective:   David Floyd is a 58 y.o. 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 He tried to get his knee MRI, but couldn't get a ride to the location. He is in the process of setting up orthopedic follow up in [place].    He continues to have bilateral knee pain with the L knee increasing due to favoring the R knee. He has bilateral arthritis and meniscal injury on the R side.    Pt states that he had his testicular ultrasound. He states that since we performed STI testing on him, he took a closer look at his testicles and proceeded to find 3 "warts." He states that he found a urologist at [facility] that would take his insurance.    PFSH:     Current Outpatient Medications on File Prior to Visit   Medication Sig Dispense Refill   ??? [DISCONTINUED] cyclobenzaprine (FLEXERIL) 10 mg tablet Take 1 Tab by mouth three (3) times daily as needed for Muscle Spasm(s). 90 Tab 0   ??? clopidogrel (PLAVIX) 75 mg tab Take  by mouth.     ??? diclofenac (VOLTAREN) 1 % gel Apply 4 g to affected area four (4) times daily. 1 Each 1   ??? [DISCONTINUED] cetirizine (ZYRTEC) 10 mg tablet Take  by mouth.     ??? [DISCONTINUED] meloxicam (MOBIC) 7.5 mg tablet Take  by mouth daily.     ??? [DISCONTINUED] omeprazole (PRILOSEC) 40 mg capsule Take 40 mg by mouth daily.     ??? [DISCONTINUED] rOPINIRole (REQUIP) 0.5 mg tablet Take  by mouth three (3) times daily.     ??? [DISCONTINUED] albuterol (PROVENTIL HFA, VENTOLIN HFA, PROAIR HFA) 90 mcg/actuation inhaler Take 2 Puffs by inhalation every four (4) hours as needed for Wheezing. 1 Inhaler 0    ??? [DISCONTINUED] tamsulosin (FLOMAX) 0.4 mg capsule Take 0.4 mg by mouth daily.       No current facility-administered medications on file prior to visit.        Patient Active Problem List   Diagnosis Code   ??? Abnormal ejaculation N53.19   ??? Atherosclerosis of coronary artery I25.10   ??? Bipolar 1 disorder (HCC) F31.9   ??? BPH (benign prostatic hyperplasia) N40.0   ??? Cannabis use disorder, moderate, dependence (HCC) F12.20   ??? Cervical facet syndrome M47.812   ??? Chronic bilateral low back pain without sciatica M54.5, G89.29   ??? Chronic hepatitis C (HCC) B18.2   ??? Chronic musculoskeletal pain M79.18, G89.29   ??? Chronic pain of both knees M25.561, M25.562, G89.29   ??? Cirrhosis of liver (HCC) K74.60   ??? Cocaine abuse (HCC) F14.10   ??? COPD (chronic obstructive pulmonary disease) (HCC) J44.9   ??? GERD (gastroesophageal reflux disease) K21.9   ??? Schizophrenia (HCC) F20.9   ??? 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 Marital status: SINGLE     Spouse name: Not on file   ??? Number of children: Not on  file   ??? Years of education: Not on file   ??? Highest education level: Not on file   Occupational History   ??? Not on file   Social Needs   ??? Financial resource strain: Not on file   ??? Food insecurity:     Worry: Not on file     Inability: Not on file   ??? Transportation needs:     Medical: Not on file     Non-medical: Not on file   Tobacco Use   ??? Smoking status: Former Smoker     Types: Cigarettes   ??? Smokeless tobacco: Never Used   Substance and Sexual Activity   ??? Alcohol use: Not Currently   ??? Drug use: Not on file   ??? Sexual activity: Not on file   Lifestyle   ??? Physical activity:     Days per week: Not on file     Minutes per session: Not on file   ??? Stress: Not on file   Relationships   ??? Social connections:     Talks on phone: Not on file     Gets together: Not on file     Attends religious service: Not on file      Active member of club or organization: Not on file     Attends meetings of clubs or organizations: Not on file     Relationship status: Not on file   ??? Intimate partner violence:     Fear of current or ex partner: Not on file     Emotionally abused: Not on file     Physically abused: Not on file     Forced sexual activity: Not on file   Other Topics Concern   ??? Not on file   Social History Narrative   ??? Not on file       Review of Systems   Constitutional: Negative for chills, fever and malaise/fatigue.   Genitourinary: Negative for dysuria, frequency, hematuria and urgency.        No penile discharge. Positive for penile lesions.   Musculoskeletal: Positive for back pain and joint pain. Negative for falls, myalgias and neck pain.   Neurological: Negative for tingling and weakness.         Objective:     Visit Vitals  BP 128/75 (BP 1 Location: Right arm, BP Patient Position: Sitting)   Pulse (!) 59   Temp 97.4 ??F (36.3 ??C) (Oral)   Resp 16   Ht 6\' 4"  (1.93 m)   Wt 204 lb (92.5 kg)   SpO2 99%   BMI 24.83 kg/m??          Physical Exam  Constitutional:       Appearance: Normal appearance.   Genitourinary:     Comments: R inguinal lymph node decreased in size. Penis with three melanocytic nevi. No warts noted. No penile discharge.  Musculoskeletal:         General: Swelling present.      Comments: Swelling of R knee with brace in place. Tenderness of L knee along medial and lateral joint line. Joint without laxity.   Skin:     General: Skin is warm and dry.      Findings: No erythema.   Neurological:      Mental Status: He is alert.         Pertinent Labs/Studies: none      Assessment and orders:       ICD-10-CM ICD-9-CM  1. Penile lesion N48.9 607.89    2. Chronic pain of both knees M25.561 719.46     M25.562 338.29     G89.29     3. Chronic bilateral low back pain, unspecified whether sciatica present M54.5 724.2     G89.29 338.29      Encounter Diagnoses   Name Primary?   ??? Penile lesion Yes    ??? Chronic pain of both knees    ??? Chronic bilateral low back pain, unspecified whether sciatica present      Diagnoses and all orders for this visit:    1. Penile lesion - pt had three moles on his penis. Pt had not previously paid attention to these until we performed STI screening. He is asymptomatic without penile discharge or other lesions.   -      Discussed that pt should keep an eye on the lesions to ensure they do not multiply or enlarge, but otherwise that they appear as common moles    2. Chronic pain of both knees - Pt has meniscal injury of right knee. Now his left knee is starting to hurt secondary to favoring the right knee. 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 He is currently trying to get scheduled with orthopedics and pain management. He is unable to take NSAIDs 2/2 to being on brillinta. Tylenol does not help the pain.   -      Offered for the patient to return in a couple of days for right knee injection    3. Chronic bilateral low back pain, unspecified whether sciatica present - trying to schedule with pain management and orthopedics. Pt was previously under the care of a pain medicine specialist prior to moving.       Follow-up and Dispositions    ?? Return in about 2 days (around 09/06/2018).           I have discussed the diagnosis with the patient and the intended plan as seen in the above orders.  Social history, medical history, and labs were reviewed.  The patient has received an after-visit summary and questions were answered concerning future plans.  I have discussed medication side effects and warnings with the patient as well.    Judithann GravesAllison A Shafter Jupin, MD  Resident [facility]  09/06/18

## 2018-09-04 NOTE — Progress Notes (Signed)
1. Have you been to the ER, urgent care clinic since your last visit?  Hospitalized since your last visit? no    2. Have you seen or consulted any other health care providers outside of the [facility] since your last visit?  Include any pap smears or colon screening. no  Reviewed record in preparation for visit and have obtained necessary documentation.  Patient did not bring medications to visit for review.  Information provided on Advanced Directive, Living Will.  Body mass index is 24.83 kg/m??.   Health Maintenance Due   Topic Date Due   ??? Shingrix Vaccine Age 50> (1 of 2) 08/10/2010   ??? FOBT Q1Y Age 50-75  08/10/2010

## 2018-09-04 NOTE — Progress Notes (Signed)
1. Have you been to the ER, urgent care clinic since your last visit?  Hospitalized since your last visit? no    2. Have you seen or consulted any other health care providers outside of the [facility] since your last visit?  Include any pap smears or colon screening. no  Reviewed record in preparation for visit and have obtained necessary documentation.  Patient did not bring medications to visit for review.  Information provided on Advanced Directive, Living Will.  Body mass index is 24.83 kg/m.   Health Maintenance Due   Topic Date Due   . Shingrix Vaccine Age 93> (1 of 2) 08/10/2010   . FOBT Q1Y Age 83-75  08/10/2010

## 2018-09-04 NOTE — Progress Notes (Signed)
[facility]    Subjective:   David Floyd is a 58 y.o. 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 He tried to get his knee MRI, but couldn't get a ride to the location. He is in the process of setting up orthopedic follow up in [place].    He continues to have bilateral knee pain with the L knee increasing due to favoring the R knee. He has bilateral arthritis and meniscal injury on the R side.    Pt states that he had his testicular ultrasound. He states that since we performed STI testing on him, he took a closer look at his testicles and proceeded to find 3 "warts." He states that he found a urologist at [facility] that would take his insurance.    PFSH:     Current Outpatient Medications on File Prior to Visit   Medication Sig Dispense Refill   ??? [DISCONTINUED] cyclobenzaprine (FLEXERIL) 10 mg tablet Take 1 Tab by mouth three (3) times daily as needed for Muscle Spasm(s). 90 Tab 0   ??? clopidogrel (PLAVIX) 75 mg tab Take  by mouth.     ??? diclofenac (VOLTAREN) 1 % gel Apply 4 g to affected area four (4) times daily. 1 Each 1   ??? [DISCONTINUED] cetirizine (ZYRTEC) 10 mg tablet Take  by mouth.     ??? [DISCONTINUED] meloxicam (MOBIC) 7.5 mg tablet Take  by mouth daily.     ??? [DISCONTINUED] omeprazole (PRILOSEC) 40 mg capsule Take 40 mg by mouth daily.     ??? [DISCONTINUED] rOPINIRole (REQUIP) 0.5 mg tablet Take  by mouth three (3) times daily.     ??? [DISCONTINUED] albuterol (PROVENTIL HFA, VENTOLIN HFA, PROAIR HFA) 90 mcg/actuation inhaler Take 2 Puffs by inhalation every four (4) hours as needed for Wheezing. 1 Inhaler 0   ??? [DISCONTINUED] tamsulosin (FLOMAX) 0.4 mg capsule Take 0.4 mg by  mouth daily.       No current facility-administered medications on file prior to visit.        Patient Active Problem List   Diagnosis Code   ??? Abnormal ejaculation N53.19   ??? Atherosclerosis of coronary artery I25.10   ??? Bipolar 1 disorder (HCC) F31.9   ??? BPH (benign prostatic hyperplasia) N40.0   ??? Cannabis use disorder, moderate, dependence (HCC) F12.20   ??? Cervical facet syndrome M47.812   ??? Chronic bilateral low back pain without sciatica M54.5, G89.29   ??? Chronic hepatitis C (HCC) B18.2   ??? Chronic musculoskeletal pain M79.18, G89.29   ??? Chronic pain of both knees M25.561, M25.562, G89.29   ??? Cirrhosis of liver (HCC) K74.60   ??? Cocaine abuse (HCC) F14.10   ??? COPD (chronic obstructive pulmonary disease) (HCC) J44.9   ??? GERD (gastroesophageal reflux disease) K21.9   ??? Schizophrenia (HCC) F20.9   ??? 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 Marital status: SINGLE     Spouse name: Not on file   ??? Number of children: Not on  file   ??? Years of education: Not on file   ??? Highest education level: Not on file   Occupational History   ??? Not on file   Social Needs   ??? Financial resource strain: Not on file   ??? Food insecurity:     Worry: Not on file     Inability: Not on file   ??? Transportation needs:     Medical: Not on file     Non-medical: Not on file   Tobacco Use   ??? Smoking status: Former Smoker     Types: Cigarettes   ??? Smokeless tobacco: Never Used   Substance and Sexual Activity   ??? Alcohol use: Not Currently   ??? Drug use: Not on file   ??? Sexual activity: Not on file   Lifestyle   ??? Physical activity:     Days per week: Not on file     Minutes per session: Not on file   ??? Stress: Not on file   Relationships   ??? Social connections:     Talks on phone: Not on file     Gets together: Not on file     Attends religious service: Not on file     Active member of club or organization: Not on file     Attends meetings of clubs or  organizations: Not on file     Relationship status: Not on file   ??? Intimate partner violence:     Fear of current or ex partner: Not on file     Emotionally abused: Not on file     Physically abused: Not on file     Forced sexual activity: Not on file   Other Topics Concern   ??? Not on file   Social History Narrative   ??? Not on file       Review of Systems   Constitutional: Negative for chills, fever and malaise/fatigue.   Genitourinary: Negative for dysuria, frequency, hematuria and urgency.        No penile discharge. Positive for penile lesions.   Musculoskeletal: Positive for back pain and joint pain. Negative for falls, myalgias and neck pain.   Neurological: Negative for tingling and weakness.         Objective:     Visit Vitals  BP 128/75 (BP 1 Location: Right arm, BP Patient Position: Sitting)   Pulse (!) 59   Temp 97.4 ??F (36.3 ??C) (Oral)   Resp 16   Ht 6\' 4"  (1.93 m)   Wt 204 lb (92.5 kg)   SpO2 99%   BMI 24.83 kg/m??          Physical Exam  Constitutional:       Appearance: Normal appearance.   Genitourinary:     Comments: R inguinal lymph node decreased in size. Penis with three melanocytic nevi. No warts noted. No penile discharge.  Musculoskeletal:         General: Swelling present.      Comments: Swelling of R knee with brace in place. Tenderness of L knee along medial and lateral joint line. Joint without laxity.   Skin:     General: Skin is warm and dry.      Findings: No erythema.   Neurological:      Mental Status: He is alert.         Pertinent Labs/Studies: none      Assessment and orders:       ICD-10-CM ICD-9-CM  1. Penile lesion N48.9 607.89    2. Chronic pain of both knees M25.561 719.46     M25.562 338.29     G89.29     3. Chronic bilateral low back pain, unspecified whether sciatica present M54.5 724.2     G89.29 338.29      Encounter Diagnoses   Name Primary?   ??? Penile lesion Yes   ??? Chronic pain of both knees    ??? Chronic bilateral low back pain, unspecified whether sciatica present       Diagnoses and all orders for this visit:    1. Penile lesion - pt had three moles on his penis. Pt had not previously paid attention to these until we performed STI screening. He is asymptomatic without penile discharge or other lesions.   -      Discussed that pt should keep an eye on the lesions to ensure they do not multiply or enlarge, but otherwise that they appear as common moles    2. Chronic pain of both knees - Pt has meniscal injury of right knee. Now his left knee is starting to hurt secondary to favoring the right knee. 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 He is currently trying to get scheduled with orthopedics and pain management. He is unable to take NSAIDs 2/2 to being on brillinta. Tylenol does not help the pain.   -      Offered for the patient to return in a couple of days for right knee injection    3. Chronic bilateral low back pain, unspecified whether sciatica present - trying to schedule with pain management and orthopedics. Pt was previously under the care of a pain medicine specialist prior to moving.       Follow-up and Dispositions    ?? Return in about 2 days (around 09/06/2018).           I have discussed the diagnosis with the patient and the intended plan as seen in the above orders.  Social history, medical history, and labs were reviewed.  The patient has received an after-visit summary and questions were answered concerning future plans.  I have discussed medication side effects and warnings with the patient as well.    Judithann Graves, MD  [facility]ident [facility]  09/06/18

## 2018-09-05 ENCOUNTER — Ambulatory Visit: Attending: Specialist | Primary: Student in an Organized Health Care Education/Training Program

## 2018-09-05 ENCOUNTER — Ambulatory Visit
Admit: 2018-09-05 | Discharge: 2018-09-05 | Payer: PRIVATE HEALTH INSURANCE | Attending: Specialist | Primary: Student in an Organized Health Care Education/Training Program

## 2018-09-05 DIAGNOSIS — I25110 Atherosclerotic heart disease of native coronary artery with unstable angina pectoris: Secondary | ICD-10-CM

## 2018-09-05 MED ORDER — RANOLAZINE ER 1,000 MG TABLET,EXTENDED RELEASE,12 HR
1000 mg | ORAL_TABLET | Freq: Two times a day (BID) | ORAL | 1 refills | Status: AC
Start: 2018-09-05 — End: ?

## 2018-09-05 MED ORDER — TICAGRELOR 60 MG TAB
60 mg | ORAL_TABLET | Freq: Two times a day (BID) | ORAL | 3 refills | Status: AC
Start: 2018-09-05 — End: ?

## 2018-09-05 MED ORDER — ISOSORBIDE DINITRATE 30 MG TAB
30 mg | ORAL_TABLET | Freq: Every day | ORAL | 1 refills | Status: AC
Start: 2018-09-05 — End: ?

## 2018-09-05 MED ORDER — PRAZOSIN 1 MG CAP
1 mg | ORAL_CAPSULE | Freq: Every evening | ORAL | 3 refills | Status: AC
Start: 2018-09-05 — End: ?

## 2018-09-05 MED ORDER — ATORVASTATIN 40 MG TAB
40 mg | ORAL_TABLET | Freq: Every day | ORAL | 1 refills | Status: AC
Start: 2018-09-05 — End: ?

## 2018-09-05 NOTE — Progress Notes (Signed)
David Floyd      May 09, 1961   David Robinsons, MD  Date of Visit-09/05/2018     David Floyd,  PCP=Hinson, Briscoe Burns, MD     [facility]  [facility].      HPI:   David Floyd is a 58 y.o. male     Patient refered new patient has a history of coronary artery disease    Other medical conditions including hepatitis C COPD GERD PTSD RLS BPH and a penile lesion.  He also an MRI in his back which showed moderate severe canal stenosis and saw his PCP yesterday.    Patient was first seen in the practice of [facility] in December of this past year    2 months ago as he had moved from [place] at that time   He reports a history of leaky valve and a stent with a coronary CTA history of previous mild MI and stroke  He currently takes aspirin Lipitor Brilinta Isordil Plavix Ranexa  No EKG in system  History per family father died of MI at 59 he had a PCI    Patient had PCI of mid LAD ruptured plaque 90% lesion and PCI with DES deployed 06/09/2016    In speaking the patient he notes the history of a leaky valve and does get occasional irregular heartbeat.    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  He initially did well for a few months but then had more chest pain and went in and had a scan but not a cath he thinks it was told he had disease downstream from the previous blockage he was treated with medications that time.       In talking to him currently over the last 3 months he is developed some new chest pain goes to to his back it is not typical of the previous pain  but is certainly in the chest less about 5 minutes occurs 1 or 2 times a month typically occurs if he does exertion.    He is on usual regimen with Brilinta and Plavix along with aspirin he also has Ranexa and isosorbide once a day at 20 mg.  He has blister packs from his previous pharmacy the pharmacy in town however will charge him $20 so he is going to do bottles at [facility].  No specialty comments available.  Assessment/Plans:  1. 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      I do not know if he is had Plavix resistant testing so for now have him continue the aspirin and the Brilinta.  He will stop Plavix.      I  have given him refills for Brilinta, atorvastatin, Minipress, Ranexa and isosorbide dinitrate but his previous dose of 20 mg once a day I have not given him that twice daily since he is already on Ranexa.     - ECHO ADULT COMPLETE; Future  - NUCLEAR CARDIAC STRESS TEST; Future    2. Heart valve disorder  Since he has a history of "leaky valve possibly MR, get an echo when he gets nuclear stress test and then he will see me back here at [facility] in a month.       3. Essential hypertension  At goal , meds and possible side effects reviewed and patient denies  Key CAD CHF Meds             aspirin 81 mg chewable tablet (Taking) Take 81 mg by mouth daily.    nitroglycerin (NITROSTAT) 0.4 mg SL tablet (Taking) 0.4 mg by SubLINGual route.    isosorbide dinitrate (ISORDIL) 30 mg tablet (Taking) Take 1 Tab by mouth daily.    ticagrelor (BRILINTA) 60 mg tab tablet (Taking) Take 1 Tab by mouth two (2) times a day.     prazosin (MINIPRESS) 1 mg capsule (Taking) Take 3 Caps by mouth nightly.    atorvastatin (LIPITOR) 40 mg tablet (Taking) Take 1 Tab by mouth daily.    ranolazine ER (RANEXA) 1,000 mg (Taking) Take 1 Tab by mouth two (2) times a day.    clopidogrel (PLAVIX) 75 mg tab (Taking) Take  by mouth.         09/05/18 127/85   09/04/18 128/75   08/16/18 137/83   07/26/18 114/70       4. Hypercholesteremia  Key Antihyperlipidemia Meds             atorvastatin (LIPITOR) 40 mg tablet (Taking) Take 1 Tab by mouth daily.        At goal , denies excess muscle aches or new liver issues  Lab Results   Component Value Date/Time    LDL, calculated 60.6 07/12/2018 10:12 AM       Very nice fellow has limited resources so we want to be cost conscious       EKG is done it shows sinus bradycardia at a rate of 56 PR interval 154 QTC 421 QRS is 100 ms it is within normal limits    5.COPD stable with no wheeze      Future Appointments   Date Time Provider Department Floyd   09/25/2018  1:00 PM NUCLEAR, [facility] SCHED   09/25/2018  3:00 PM ECHO, [facility] SCHED   10/03/2018  3:20 PM Jamahl Lemmons, MD [facility] SCHED           GBE:EFEOFHQ complete  as above. Respiratory as above with no wheezing or hemoptysis.ROS- complete multisystem 11 ROS done and reviewed as pertinent   He denies  symptoms of unusual weight loss , fevers,  BRBPR, hematuria,  or recent stroke    Past Medical History:   Diagnosis Date   ??? Atherosclerosis of coronary artery 12/27/2016    Last Assessment & Plan:  Patient with MI in 2017 Followed by cardiology We will request cardiology records Status post stenting and currently asymptomatic Continue current medications Discussed return precautions   ??? Bipolar 1 disorder (HCC) 05/27/2013    Last Assessment & Plan:  Patient denies being diagnosed with this, but it is diagnosis that is in his chart He is not currently taking his psychiatric medications and his  diagnoses are unclear I will request  records from his psychiatrist Encouraged him to continue to be seen by his psychiatrist and be managed that way I encouraged him to not stop or change any medications on his own without consul   ??? Cirrhosis of liver (HCC) 04/03/2018    Last Assessment & Plan:  Chronic Currently asymptomatic No varices on most recent EGD in 2018 We will discuss with patient at next visit the need to follow-up regularly with GI to be managed and followed for this This is likely related to his history of hepatitis C as well as alcoholism He needs to have screening right upper quadrant ultrasounds every 6 months [facility] and   ??? COPD (chronic obstructive pulmonary disease) (HCC) 06/27/2012    Last Assessment & Plan:  Chronic and well controlled without any recent exacerbations He is breathing comfortably with normal lung exam today He is followed by pulmonology Continue Advair and albuterol as needed Return precautions discussed   ??? 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 Essential hypertension 09/18/2018   ??? GERD (gastroesophageal reflux disease) 06/27/2012    Last Assessment & Plan:  Chronic Currently asymptomatic and well controlled Continue Prilosec daily   ??? Hypercholesteremia 09/18/2018      Exam and Labs:  Visit Vitals  BP 127/85   Pulse (!) 58   Temp 98.2 ??F (36.8 ??C) (Oral)   Resp 16   Ht 6\' 4"  (1.93 m)   Wt 203 lb (92.1 kg)   SpO2 97%   BMI 24.71 kg/m??     Constitutional:  NAD, comfortable , moist mucous membranesHENT: Head: NC,ATEyes: No scleral icterus. Neck:  Neck supple. No JVD present. No tracheal deviation,mass  Chest: Effort normal & normal respiratory excursion     Lungs:    No rales/wheezes ; breath sounds normal. No stridor or  distress.  Heart:    normal rate, regular rhythm, normal S1, S2, no murmurs, rubs, clicks or gallops , PMI non displaced.    Edema: Edema is none.     Extremities:  no clubbing or cyanosis.  Abdominal:  no abnormal distension.   Neurological: alert, conversant and oriented . Skin: Skin is not cold. No obvious systemic rash noted. Not diaphoretic. No erythema. Psychiatric:  Grossly normal mood and affect.  Behavior appears normal.     Lab Results   Component Value Date/Time    Cholesterol, total 119 07/12/2018 10:12 AM    HDL Cholesterol 37 07/12/2018 10:12 AM    LDL, calculated 60.6 07/12/2018 10:12 AM    Triglyceride 107 07/12/2018 10:12 AM    CHOL/HDL Ratio 3.2 07/12/2018 10:12 AM     Lab Results   Component Value Date/Time    Sodium 136 07/12/2018 10:12 AM    Potassium 4.4 07/12/2018 10:12 AM    Chloride 104 07/12/2018 10:12 AM    CO2 25 07/12/2018 10:12 AM    Anion gap 7 07/12/2018 10:12 AM    Glucose 91 07/12/2018 10:12 AM    BUN 8 07/12/2018 10:12 AM    Creatinine 0.92 07/12/2018 10:12 AM    BUN/Creatinine ratio 9 (L) 07/12/2018 10:12 AM    GFR est AA >60 07/12/2018 10:12 AM    GFR est non-AA >60 07/12/2018 10:12 AM    Calcium 9.1 07/12/2018 10:12 AM      Wt Readings from Last 3 Encounters:   09/05/18 203 lb (92.1 kg)  09/04/18 204 lb (92.5 kg)   08/16/18 204 lb (92.5 kg)      BP Readings from Last 3 Encounters:   09/05/18 127/85   09/04/18 128/75   08/16/18 137/83        Current Outpatient Medications   Medication Sig   ??? aspirin 81 mg chewable tablet Take 81 mg by mouth daily.   ??? nitroglycerin (NITROSTAT) 0.4 mg SL tablet 0.4 mg by SubLINGual route.   ??? atorvastatin (LIPITOR) 40 mg tablet Take  by mouth daily.   ??? ticagrelor (BRILINTA) 60 mg tab tablet Take  by mouth two (2) times a day.   ??? cetirizine (ZYRTEC) 10 mg tablet Take  by mouth.   ??? isosorbide dinitrate (ISORDIL) 30 mg tablet Take 20 mg by mouth four (4) times daily.   ??? meloxicam (MOBIC) 7.5 mg tablet Take  by mouth daily.   ??? omeprazole (PRILOSEC) 40 mg capsule Take 40 mg by mouth daily.   ??? clopidogrel (PLAVIX) 75 mg tab Take  by mouth.    ??? prazosin (MINIPRESS) 1 mg capsule Take  by mouth nightly.   ??? ranolazine ER (RANEXA) 1,000 mg Take  by mouth two (2) times a day.   ??? rOPINIRole (REQUIP) 0.5 mg tablet Take  by mouth three (3) times daily.   ??? tamsulosin (FLOMAX) 0.4 mg capsule Take 0.4 mg by mouth daily.   ??? albuterol (PROVENTIL HFA, VENTOLIN HFA, PROAIR HFA) 90 mcg/actuation inhaler Take 2 Puffs by inhalation every four (4) hours as needed for Wheezing.   ??? cyclobenzaprine (FLEXERIL) 10 mg tablet Take 1 Tab by mouth three (3) times daily as needed for Muscle Spasm(s).   ??? diclofenac (VOLTAREN) 1 % gel Apply 4 g to affected area four (4) times daily.     No current facility-administered medications for this visit.       History reviewed. No pertinent surgical history.   Social Hx=  reports that he has been smoking cigarettes. He has never used smokeless tobacco. He reports previous alcohol use.   Family Hx= family history includes Coronary Artery Disease in his father.  Impression see above.

## 2018-09-05 NOTE — Progress Notes (Signed)
1. Have you been to the ER, urgent care clinic, or been hospitalized since your last visit?   No     2. Have you seen or consulted any other health care providers outside of the [facility] since your last visit?   No     Reviewed record in preparation for visit and have necessary documentation  Goals that were addressed and/or need to be completed during or after this appointment include   Health Maintenance Due   Topic Date Due   ??? Shingrix Vaccine Age 58> (1 of 2) 08/10/2010   ??? FOBT Q1Y Age 58-75  08/10/2010       I have received verbal consent from Rithvik W Hodge to discuss any/all medical information while others present in the room.

## 2018-09-05 NOTE — Patient Instructions (Signed)
September 05, 2018  STRAN BECTON   [address]  [place] [address]      Dear Bethann Berkshire:    Thank you for requesting access to MyChart. Please follow the instructions below to view your test results, access and download parts of your medical record, view details of your past and upcoming appointments, and view your medications online.       How Do I Sign Up?              1. In your internet browser, go to [email or web address]  2. Click on the First Time User? Click Here link in the Sign In box. You will see the New Member Sign Up page.  3. Enter your MyChart Access Code exactly as it appears below. You will not need to use this code after you???ve completed the sign-up process. If you do not sign up before the expiration date, you must request a new code.    ?? MyChart Access Code: [email or web address]  ?? Expires: 10/01/2018  6:06 PM    4. Enter the last four digits of your Social Security Number (xxxx) and Date of Birth (mm/dd/yyyy) as indicated and click Submit. You will be taken to the next sign-up page.  5. Create a MyChart ID. This will be your MyChart login ID and cannot be changed, so think of one that is secure and easy to remember.  6. Create a MyChart password. You can change your password at any time.  7. Enter your Password Reset Question and Answer. This can be used at a later time if you forget your password.   8. Enter your e-mail address. You will receive e-mail notification when new information is available in MyChart.  9. Click Sign Up. You can now view and download portions of your medical record.  10. Click the Download Summary menu link to download a portable copy of your medical information.       Additional Information:              If you require any assistance or have any questions, please contact our MyChart Helpdesk at [phone], email at [email or web address] or check online in our Frequently Asked Questions.     Remember, MyChart is NOT to be used for urgent needs. For medical emergencies, dial 911.    Now available from your iPhone and Android!    Sincerely,   [facility]       new to my practice

## 2018-09-05 NOTE — Progress Notes (Signed)
[name]      04-30-1961   [name], MD  Date of Visit-09/05/2018     [facility],  PCP=Hinson, Briscoe Burns, MD     [facility]  [facility].      HPI:   David Floyd is a 58 y.o. male     Patient refered new patient has a history of coronary artery disease    Other medical conditions including hepatitis C COPD GERD PTSD RLS BPH and a penile lesion.  He also an MRI in his back which showed moderate severe canal stenosis and saw his PCP yesterday.    Patient was first seen in the practice of [facility] in December of this past year    2 months ago as he had moved from [place] at that time   He reports a history of leaky valve and a stent with a coronary CTA history of previous mild MI and stroke  He currently takes aspirin Lipitor Brilinta Isordil Plavix Ranexa  No EKG in system  History per family father died of MI at 77 he had a PCI    Patient had PCI of mid LAD ruptured plaque 90% lesion and PCI with DES deployed 06/09/2016    In speaking the patient he notes the history of a leaky valve and does get occasional irregular heartbeat.    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  He initially did well for a few months but then had more chest pain and went in and had a scan but not a cath he thinks it was told he had disease downstream from the previous blockage he was treated with medications that time.       In talking to him currently over the last 3 months he is developed some new chest pain goes to to his back it is not typical of the previous pain but is certainly in the chest less about 5 minutes occurs 1 or 2  times a month typically occurs if he does exertion.    He is on usual regimen with Brilinta and Plavix along with aspirin he also has Ranexa and isosorbide once a day at 20 mg.  He has blister packs from his previous pharmacy the pharmacy in town however will charge him $20 so he is going to do bottles at [facility].  No specialty comments available.  Assessment/Plans:  1. 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      I do not know if he is had Plavix resistant testing so for now have him continue the aspirin and the Brilinta.  He will stop Plavix.      I  have given him refills for Brilinta, atorvastatin, Minipress, Ranexa and isosorbide dinitrate but his previous dose of 20 mg once a day I have not given him that twice daily since he is already on Ranexa.     - ECHO ADULT COMPLETE; Future  - NUCLEAR CARDIAC STRESS TEST; Future    2. Heart valve disorder  Since he has a history of "leaky valve possibly MR, get an echo when he gets nuclear stress test and then [facility].       3. Essential hypertension  At goal , meds and possible side effects reviewed and patient denies  Key CAD CHF Meds             aspirin 81 mg chewable tablet (Taking) Take 81 mg by mouth daily.    nitroglycerin (NITROSTAT) 0.4 mg SL tablet (Taking) 0.4 mg by SubLINGual route.    isosorbide dinitrate (ISORDIL) 30 mg tablet (Taking) Take 1 Tab by mouth daily.    ticagrelor (BRILINTA) 60 mg tab tablet (Taking) Take 1 Tab by mouth two (2) times a day.    prazosin (MINIPRESS) 1 mg capsule (Taking) Take 3 Caps by mouth nightly.    atorvastatin (LIPITOR) 40 mg tablet  (Taking) Take 1 Tab by mouth daily.    ranolazine ER (RANEXA) 1,000 mg (Taking) Take 1 Tab by mouth two (2) times a day.    clopidogrel (PLAVIX) 75 mg tab (Taking) Take  by mouth.         09/05/18 127/85   09/04/18 128/75   08/16/18 137/83   07/26/18 114/70       4. Hypercholesteremia  Key Antihyperlipidemia Meds             atorvastatin (LIPITOR) 40 mg tablet (Taking) Take 1 Tab by mouth daily.        At goal , denies excess muscle aches or new liver issues  Lab Results   Component Value Date/Time    LDL, calculated 60.6 07/12/2018 10:12 AM       Very nice fellow has limited resources so we want to be cost conscious       EKG is done it shows sinus bradycardia at a rate of 56 PR interval 154 QTC 421 QRS is 100 ms it is within normal limits    5.COPD stable with no wheeze      Future Appointments   Date Time Provider Department Center   09/25/2018  1:00 PM NUCLEAR, [facility] SCH[facility]   09/25/2018  3:00 PM ECHO, [facility] SCHED   10/03/2018  3:20 PM Vaughn Frieze, MD [facility] SCHED           ZOX:[id number]:Cardiac complete  as above. Respiratory as above with no wheezing or hemoptysis.ROS- complete multisystem 11 ROS done and reviewed as pertinent   He denies  symptoms of unusual weight loss , fevers,  BRBPR, hematuria,  or recent stroke    Past Medical History:   Diagnosis Date   ??? Atherosclerosis of coronary artery 12/27/2016    Last Assessment & Plan:  Patient with MI in 2017 Followed by cardiology We will request cardiology records Status post stenting and currently asymptomatic Continue current medications Discussed return precautions   ??? Bipolar 1 disorder (HCC) 05/27/2013    Last Assessment & Plan:  Patient denies being diagnosed with this, but it is diagnosis that is in his chart He is not currently taking his psychiatric medications and his  diagnoses are unclear I will request records from his psychiatrist Encouraged him to continue to be seen by his psychiatrist and be managed that way I  encouraged him to not stop or change any medications on his own without consul   ??? Cirrhosis of liver (HCC) 04/03/2018    Last Assessment & Plan:  Chronic Currently asymptomatic No varices on most recent EGD in 2018 We will discuss with patient at next visit the need to follow-up regularly with GI to be managed and followed for this This is likely related to his history of hepatitis C as well as alcoholism He needs to have screening right upper quadrant ultrasounds every 6 months to watch for [facility] Next colonoscopy and   ??? COPD (chronic obstructive pulmonary disease) (HCC) 06/27/2012    Last Assessment & Plan:  Chronic and well controlled without any recent exacerbations He is breathing comfortably with normal lung exam today He is followed by pulmonology Continue Advair and albuterol as needed Return precautions discussed   ??? 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 Essential hypertension 09/18/2018   ??? GERD (gastroesophageal reflux disease) 06/27/2012    Last Assessment & Plan:  Chronic Currently asymptomatic and well controlled Continue Prilosec daily   ??? Hypercholesteremia 09/18/2018      Exam and Labs:  Visit Vitals  BP 127/85   Pulse (!) 58   Temp 98.2 ??F (36.8 ??C) (Oral)   Resp 16   Ht 6\' 4"  (1.93 m)   Wt 203 lb (92.1 kg)   SpO2 97%   BMI 24.71 kg/m??     Constitutional:  NAD, comfortable , moist mucous membranesHENT: Head: NC,ATEyes: No scleral icterus. Neck:  Neck supple. No JVD present. No tracheal deviation,mass  Chest: Effort normal & normal respiratory excursion     Lungs:    No rales/wheezes ; breath sounds normal. No stridor or  distress.  Heart:    normal rate, regular rhythm, normal S1, S2, no murmurs, rubs, clicks or gallops , PMI non displaced.    Edema: Edema is none.    Extremities:  no clubbing or cyanosis.  Abdominal:  no abnormal distension.   Neurological: alert, conversant and oriented . Skin: Skin is not cold. No  obvious systemic rash noted. Not diaphoretic. No erythema. Psychiatric:  Grossly normal mood and affect.  Behavior appears normal.     Lab Results   Component Value Date/Time    Cholesterol, total 119 07/12/2018 10:12 AM    HDL Cholesterol 37 07/12/2018 10:12 AM    LDL, calculated 60.6 07/12/2018 10:12 AM    Triglyceride 107 07/12/2018 10:12 AM    CHOL/HDL Ratio 3.2 07/12/2018 10:12 AM     Lab Results   Component Value Date/Time    Sodium 136 07/12/2018 10:12 AM    Potassium 4.4 07/12/2018 10:12 AM    Chloride 104 07/12/2018 10:12 AM    CO2 25 07/12/2018 10:12 AM    Anion gap 7 07/12/2018 10:12 AM    Glucose 91 07/12/2018 10:12 AM    BUN 8 07/12/2018 10:12 AM    Creatinine 0.92 07/12/2018 10:12 AM    BUN/Creatinine ratio 9 (L) 07/12/2018 10:12 AM    GFR est AA >60 07/12/2018 10:12 AM    GFR est non-AA >60 07/12/2018 10:12 AM    Calcium 9.1 07/12/2018 10:12 AM      Wt Readings from Last 3 Encounters:   09/05/18 203 lb (92.1 kg)  09/04/18 204 lb (92.5 kg)   08/16/18 204 lb (92.5 kg)      BP Readings from Last 3 Encounters:   09/05/18 127/85   09/04/18 128/75   08/16/18 137/83        Current Outpatient Medications   Medication Sig   ??? aspirin 81 mg chewable tablet Take 81 mg by mouth daily.   ??? nitroglycerin (NITROSTAT) 0.4 mg SL tablet 0.4 mg by SubLINGual route.   ??? atorvastatin (LIPITOR) 40 mg tablet Take  by mouth daily.   ??? ticagrelor (BRILINTA) 60 mg tab tablet Take  by mouth two (2) times a day.   ??? cetirizine (ZYRTEC) 10 mg tablet Take  by mouth.   ??? isosorbide dinitrate (ISORDIL) 30 mg tablet Take 20 mg by mouth four (4) times daily.   ??? meloxicam (MOBIC) 7.5 mg tablet Take  by mouth daily.   ??? omeprazole (PRILOSEC) 40 mg capsule Take 40 mg by mouth daily.   ??? clopidogrel (PLAVIX) 75 mg tab Take  by mouth.   ??? prazosin (MINIPRESS) 1 mg capsule Take  by mouth nightly.   ??? ranolazine ER (RANEXA) 1,000 mg Take  by mouth two (2) times a day.   ??? rOPINIRole (REQUIP) 0.5 mg tablet Take  by mouth three (3) times  daily.   ??? tamsulosin (FLOMAX) 0.4 mg capsule Take 0.4 mg by mouth daily.   ??? albuterol (PROVENTIL HFA, VENTOLIN HFA, PROAIR HFA) 90 mcg/actuation inhaler Take 2 Puffs by inhalation every four (4) hours as needed for Wheezing.   ??? cyclobenzaprine (FLEXERIL) 10 mg tablet Take 1 Tab by mouth three (3) times daily as needed for Muscle Spasm(s).   ??? diclofenac (VOLTAREN) 1 % gel Apply 4 g to affected area four (4) times daily.     No current facility-administered medications for this visit.       History reviewed. No pertinent surgical history.   Social Hx=  reports that he has been smoking cigarettes. He has never used smokeless tobacco. He reports previous alcohol use.   Family Hx= family history includes Coronary Artery Disease in his father.  Impression see above.

## 2018-09-05 NOTE — Progress Notes (Signed)
 1. Have you been to the ER, urgent care clinic, or been hospitalized since your last visit?   No     2. Have you seen or consulted any other health care providers outside of the [facility] since your last visit?   No     Reviewed record in preparation for visit and have necessary documentation  Goals that were addressed and/or need to be completed during or after this appointment include   Health Maintenance Due   Topic Date Due   . Shingrix Vaccine Age 12> (1 of 2) 08/10/2010   . FOBT Q1Y Age 6-75  08/10/2010       I have received verbal consent from Hamlin Devine Hua to discuss any/all medical information while others present in the room.

## 2018-09-06 ENCOUNTER — Ambulatory Visit
Attending: Student in an Organized Health Care Education/Training Program | Primary: Student in an Organized Health Care Education/Training Program

## 2018-09-06 ENCOUNTER — Ambulatory Visit
Admit: 2018-09-06 | Discharge: 2018-09-06 | Payer: PRIVATE HEALTH INSURANCE | Attending: Student in an Organized Health Care Education/Training Program | Primary: Student in an Organized Health Care Education/Training Program

## 2018-09-06 DIAGNOSIS — M1711 Unilateral primary osteoarthritis, right knee: Secondary | ICD-10-CM

## 2018-09-06 MED ORDER — LIDOCAINE (PF) 10 MG/ML (1 %) IJ SOLN
10 mg/mL (1 %) | Freq: Once | INTRAMUSCULAR | 0 refills | Status: AC
Start: 2018-09-06 — End: 2018-09-06

## 2018-09-06 MED ORDER — ALBUTEROL SULFATE HFA 90 MCG/ACTUATION AEROSOL INHALER
90 mcg/actuation | RESPIRATORY_TRACT | 0 refills | Status: AC | PRN
Start: 2018-09-06 — End: ?

## 2018-09-06 MED ORDER — BUPIVACAINE (PF) 0.25 % (2.5 MG/ML) IJ SOLN
0.25 % (2.5 mg/mL) | Freq: Once | INTRAMUSCULAR | 0 refills | Status: AC
Start: 2018-09-06 — End: 2018-09-06

## 2018-09-06 MED ORDER — CETIRIZINE 10 MG TAB
10 mg | ORAL_TABLET | Freq: Every day | ORAL | 0 refills | Status: AC
Start: 2018-09-06 — End: ?

## 2018-09-06 MED ORDER — OMEPRAZOLE 40 MG CAP, DELAYED RELEASE
40 mg | ORAL_CAPSULE | Freq: Every day | ORAL | 5 refills | Status: AC
Start: 2018-09-06 — End: ?

## 2018-09-06 MED ORDER — CYCLOBENZAPRINE 10 MG TAB
10 mg | ORAL_TABLET | Freq: Three times a day (TID) | ORAL | 1 refills | Status: AC | PRN
Start: 2018-09-06 — End: ?

## 2018-09-06 MED ORDER — ROPINIROLE 0.5 MG TAB
0.5 mg | ORAL_TABLET | Freq: Three times a day (TID) | ORAL | 5 refills | Status: AC
Start: 2018-09-06 — End: ?

## 2018-09-06 MED ORDER — CYCLOBENZAPRINE 10 MG TAB
10 mg | ORAL_TABLET | Freq: Three times a day (TID) | ORAL | 0 refills | Status: DC | PRN
Start: 2018-09-06 — End: 2018-09-06

## 2018-09-06 MED ORDER — TAMSULOSIN SR 0.4 MG 24 HR CAP
0.4 mg | ORAL_CAPSULE | Freq: Every day | ORAL | 5 refills | Status: AC
Start: 2018-09-06 — End: ?

## 2018-09-06 MED ORDER — TRIAMCINOLONE ACETONIDE 40 MG/ML SUSP FOR INJECTION
40 mg/mL | Freq: Once | INTRAMUSCULAR | 0 refills | Status: AC
Start: 2018-09-06 — End: 2018-09-06

## 2018-09-06 NOTE — Patient Instructions (Addendum)
September 06, 2018  CLAY SCIPIO   [address]  [place] [address]      Dear Bethann Berkshire:    Thank you for requesting access to MyChart. Please follow the instructions below to view your test results, access and download parts of your medical record, view details of your past and upcoming appointments, and view your medications online.       How Do I Sign Up?              1. In your internet browser, go to [email or web address]  2. Click on the First Time User? Click Here link in the Sign In box. You will see the New Member Sign Up page.  3. Enter your MyChart Access Code exactly as it appears below. You will not need to use this code after you???ve completed the sign-up process. If you do not sign up before the expiration date, you must request a new code.    ?? MyChart Access Code: [email or web address]  ?? Expires: 10/01/2018  6:06 PM    4. Enter the last four digits of your Social Security Number (xxxx) and Date of Birth (mm/dd/yyyy) as indicated and click Submit. You will be taken to the next sign-up page.  5. Create a MyChart ID. This will be your MyChart login ID and cannot be changed, so think of one that is secure and easy to remember.  6. Create a MyChart password. You can change your password at any time.  7. Enter your Password Reset Question and Answer. This can be used at a later time if you forget your password.   8. Enter your e-mail address. You will receive e-mail notification when new information is available in MyChart.  9. Click Sign Up. You can now view and download portions of your medical record.  10. Click the Download Summary menu link to download a portable copy of your medical information.       Additional Information:              If you require any assistance or have any questions, please contact our MyChart Helpdesk at [phone], email at [email or web address] or check online in our Frequently Asked Questions.     Remember, MyChart is NOT to be used for urgent needs. For medical emergencies, dial 911.    Now available from your iPhone and Android!    Sincerely,   [facility]       new to my practice     Learning About Joint Injections  What are joint injections?    Joint injections are shots into a joint, such as the knee or shoulder. They are used to put in medicines, such as pain relievers and steroid medicines.  Steroids can be injected directly into a swollen and painful joint to reduce inflammation. A steroid shot can sometimes help with short-term pain relief when other treatments haven't worked. If steroid shots help, pain may improve for weeks or months.  How are they done?  First, the area over the joint will be cleaned. Your doctor may then use a tiny needle to numb the skin in the area where you will get the joint injection.  If a tiny needle is used to numb the area, your doctor will use another needle to inject the medicine. Your doctor may use a pain reliever, a steroid, or both. You may feel some pressure or discomfort.  The procedure takes 10 to 30 minutes. But the injection itself usually takes only a  few minutes.  Your doctor may put ice on the area before you go home. You will probably go home soon after your shot.  What can you expect after a joint injection?  You may have numbness around the joint for a few hours.  If your shot included both a pain reliever and a steroid, then the pain will probably go away right away. But it might come back after a few hours. This might happen if the pain reliever wears off and the steroid hasn't started to work yet. Steroids don't always work. But when they do, the pain relief can last for several days to a few months or longer.  Your doctor may tell you to use ice on the area. You can also use ice if the pain comes back. Put ice or a cold pack on your joint for 10 to 20 minutes at a time. Put a thin cloth between the ice and your skin.   Follow your doctor's instructions carefully.  Follow-up care is a key part of your treatment and safety. Be sure to make and go to all appointments, and call your doctor if you are having problems. It's also a good idea to know your test results and keep a list of the medicines you take.  Where can you learn more?  Go to [employer]Stats.ca.  Enter [phone] in the search box to learn more about "Learning About Joint Injections."  Current as of: January 17, 2018  Content Version: 12.2  ?? 2006-2019 [employer]. Care instructions adapted under license by [employer] (which disclaims liability or warranty for this information). If you have questions about a medical condition or this instruction, always ask your healthcare professional. [employer], Inc[employer] disclaims any warranty or liability for your use of this information.

## 2018-09-06 NOTE — Progress Notes (Signed)
Chart reviewed for the following:   I, Esmond Camper, M.D., have reviewed the History, Physical and updated the Allergic reactions for Mr. Fazekas     TIME OUT performed immediately prior to start of procedure:   I, Esmond Camper, have performed the following reviews on Mr. Bessler prior to the start of the procedure:   * Patient was identified by name and date of birth   * Agreement on procedure being performed was verified   * Risks and Benefits explained to the patient   * Procedure site verified and marked as necessary   * Patient was positioned for comfort   * Consent was signed and verified   Time: 10:52 PM  Date of procedure: 09/06/18  Procedure performed by: Esmond Camper, M.D.        PROCEDURE NOTE:     Informed consent obtained verbally and risks, benefits and alternatives discussed.  Time out performed, cross checking patient ID and procedure.  The right knee was cleaned and prepped with sterile technique using Betadine x3.  The patella, quad tendon, femur and the inferior joint space were identified with palpation and the knee was injected from a inferior-lateral approach with Kenalog 80mg , 2 ml of 1% lidocaine, and 1 mL of 0.5% bupivacaine under sterile conditions using ultrasound guidance.  The patient tolerated the procedure well and there were no complications.        Esmond Camper, MD  Family Medicine Resident

## 2018-09-06 NOTE — Progress Notes (Signed)
Chief Complaint   Patient presents with   ??? Injection     Right knee injection     Body mass index is 24.9 kg/m??.    1. Have you been to the ER, urgent care clinic since your last visit?  Hospitalized since your last visit?No    2. Have you seen or consulted any other health care providers outside of the [facility] since your last visit?  Include any pap smears or colon screening. No    Reviewed record in preparation for visit and have necessary documentation  Pt did not bring medication to office visit for review  Information was given to pt on Advanced Directives, Living Will  Information was given on Shingles Vaccine  Opportunity was given for questions  Goals that were addressed and/or need to be completed after this appointment include:     Health Maintenance Due   Topic Date Due   ??? Shingrix Vaccine Age 50> (1 of 2) 08/10/2010   ??? FOBT Q1Y Age 50-75  08/10/2010

## 2018-09-06 NOTE — Progress Notes (Signed)
I discussed the findings, assessment and injection plan in detail with the resident and agree with the resident's findings and plan as documented in the resident's note.    Isaac Laud Mliss Sax, M.D.

## 2018-09-06 NOTE — Progress Notes (Signed)
Chief Complaint   Patient presents with   . Injection     Right knee injection     Body mass index is 24.9 kg/m.    1. Have you been to the ER, urgent care clinic since your last visit?  Hospitalized since your last visit?No    2. Have you seen or consulted any other health care providers outside of the [facility] since your last visit?  Include any pap smears or colon screening. No    Reviewed record in preparation for visit and have necessary documentation  Pt did not bring medication to office visit for review  Information was given to pt on Advanced Directives, Living Will  Information was given on Shingles Vaccine  Opportunity was given for questions  Goals that were addressed and/or need to be completed after this appointment include:     Health Maintenance Due   Topic Date Due   . Shingrix Vaccine Age 39> (1 of 2) 08/10/2010   . FOBT Q1Y Age 46-75  08/10/2010

## 2018-09-06 NOTE — Progress Notes (Signed)
Chart reviewed for the following:   I, Esmond Camper, M.D., have reviewed the History, Physical and updated the Allergic reactions for Mr. Aderhold     TIME OUT performed immediately prior to start of procedure:   I, Esmond Camper, have performed the following reviews on Mr. Negrette prior to the start of the procedure:   * Patient was identified by name and date of birth   * Agreement on procedure being performed was verified   * Risks and Benefits explained to the patient   * Procedure site verified and marked as necessary   * Patient was positioned for comfort   * Consent was signed and verified   Time: 10:52 PM  Date of procedure: 09/06/18  Procedure performed by: Esmond Camper, M.D.        PROCEDURE NOTE:     Informed consent obtained verbally and risks, benefits and alternatives discussed.  Time out performed, cross checking patient ID and procedure.  The right knee was cleaned and prepped with sterile technique using Betadine x3.  The patella, quad tendon, femur and the inferior joint space were identified with palpation and the knee was injected from a inferior-lateral approach with Kenalog 80mg , 2 ml of 1% lidocaine, and 1 mL of 0.5% bupivacaine under sterile conditions using ultrasound guidance.  The patient tolerated the procedure well and there were no complications.        Esmond Camper, MD  Family Medicine Resident

## 2018-09-12 ENCOUNTER — Inpatient Hospital Stay
Admit: 2018-09-12 | Payer: MEDICAID | Attending: Student in an Organized Health Care Education/Training Program | Primary: Student in an Organized Health Care Education/Training Program

## 2018-09-12 DIAGNOSIS — S83281A Other tear of lateral meniscus, current injury, right knee, initial encounter: Secondary | ICD-10-CM

## 2018-09-14 ENCOUNTER — Encounter
Attending: Student in an Organized Health Care Education/Training Program | Primary: Student in an Organized Health Care Education/Training Program

## 2018-09-18 ENCOUNTER — Ambulatory Visit
Attending: Student in an Organized Health Care Education/Training Program | Primary: Student in an Organized Health Care Education/Training Program

## 2018-09-18 ENCOUNTER — Ambulatory Visit
Admit: 2018-09-18 | Discharge: 2018-09-18 | Payer: PRIVATE HEALTH INSURANCE | Attending: Student in an Organized Health Care Education/Training Program | Primary: Student in an Organized Health Care Education/Training Program

## 2018-09-18 DIAGNOSIS — I10 Essential (primary) hypertension: Secondary | ICD-10-CM | POA: Insufficient documentation

## 2018-09-18 DIAGNOSIS — I38 Endocarditis, valve unspecified: Secondary | ICD-10-CM | POA: Insufficient documentation

## 2018-09-18 DIAGNOSIS — E7800 Pure hypercholesterolemia, unspecified: Secondary | ICD-10-CM | POA: Insufficient documentation

## 2018-09-18 DIAGNOSIS — I25110 Atherosclerotic heart disease of native coronary artery with unstable angina pectoris: Secondary | ICD-10-CM

## 2018-09-18 DIAGNOSIS — L821 Other seborrheic keratosis: Secondary | ICD-10-CM

## 2018-09-18 NOTE — Addendum Note (Signed)
Addended by: Eugene Garnet on: 09/21/2018 09:29 PM     Modules accepted: Level of Service

## 2018-09-18 NOTE — Progress Notes (Signed)
1. Have you been to the ER, urgent care clinic since your last visit?  Hospitalized since your last visit? no    2. Have you seen or consulted any other health care providers outside of the [facility] since your last visit?  Include any pap smears or colon screening. no  Reviewed record in preparation for visit and have obtained necessary documentation.  Patient did not bring medications to visit for review.  Information provided on Advanced Directive, Living Will.  Body mass index is 25.44 kg/m??.   Health Maintenance Due   Topic Date Due   ??? Shingrix Vaccine Age 50> (1 of 2) 08/10/2010   ??? FOBT Q1Y Age 50-75  08/10/2010

## 2018-09-18 NOTE — Progress Notes (Signed)
I reviewed with the resident the medical history and the resident's findings on the physical examination.  I discussed with the resident the patient's diagnosis and concur with the plan.

## 2018-09-18 NOTE — Progress Notes (Signed)
[facility]    Subjective:   David Floyd is a 58 y.o. male with history of CAD, hepatitis C, COPD, GERD, PTSD, RLS, BPH  CC: testicular swelling and knee pain  History provided by patient    HPI:  Pt has a stress test and echo on 3/3 with Dr. Sid FalconStovel, pt's cardiologist.    He has an appointment with the urologist on March 4.     He has an appointment with ophthalmology on 3/9.    Pt has a knee appointment with Dr. Matthew FolksKalore on 3/11. His fax number is [phone]. He needs a referral for the back.    Pt has a spot on his buttocks that he noticed. He states it is a bump. It does not cause him any symptoms. There is no discharge. He feels like it has been there before and went away. It has come back. He recently had STI screening that was completely negative.    PFSH:     Current Outpatient Medications on File Prior to Visit   Medication Sig Dispense Refill   ??? acetaminophen (TYLENOL ARTHRITIS PAIN) 650 mg TbER Take 650 mg by mouth every eight (8) hours.     ??? albuterol (PROVENTIL HFA, VENTOLIN HFA, PROAIR HFA) 90 mcg/actuation inhaler Take 2 Puffs by inhalation every four (4) hours as needed for Wheezing. 1 Inhaler 0   ??? tamsulosin (FLOMAX) 0.4 mg capsule Take 1 Cap by mouth daily. 30 Cap 5   ??? cetirizine (ZYRTEC) 10 mg tablet Take 1 Tab by mouth daily. 30 Tab 0   ??? omeprazole (PRILOSEC) 40 mg capsule Take 1 Cap by mouth daily. 30 Cap 5   ??? cyclobenzaprine (FLEXERIL) 10 mg tablet Take 1 Tab by mouth three (3) times daily as needed for Muscle Spasm(s). 90 Tab 1   ??? aspirin 81 mg chewable tablet Take 81 mg by mouth daily.     ??? nitroglycerin (NITROSTAT) 0.4 mg SL tablet 0.4 mg by SubLINGual route.     ??? isosorbide dinitrate (ISORDIL) 30 mg tablet Take 1 Tab by mouth daily. 30 Tab 1   ??? ticagrelor (BRILINTA) 60 mg tab tablet Take 1 Tab by mouth two (2) times a day. 60 Tab 3   ??? prazosin (MINIPRESS) 1 mg capsule Take 3 Caps by mouth nightly. 90 Cap 3    ??? atorvastatin (LIPITOR) 40 mg tablet Take 1 Tab by mouth daily. 30 Tab 1   ??? ranolazine ER (RANEXA) 1,000 mg Take 1 Tab by mouth two (2) times a day. 60 Tab 1   ??? rOPINIRole (REQUIP) 0.5 mg tablet Take 1 Tab by mouth three (3) times daily. 90 Tab 5   ??? clopidogrel (PLAVIX) 75 mg tab Take  by mouth.     ??? diclofenac (VOLTAREN) 1 % gel Apply 4 g to affected area four (4) times daily. 1 Each 1     No current facility-administered medications on file prior to visit.        Patient Active Problem List   Diagnosis Code   ??? Abnormal ejaculation N53.19   ??? Atherosclerosis of coronary artery I25.10   ??? Bipolar 1 disorder (HCC) F31.9   ??? BPH (benign prostatic hyperplasia) N40.0   ??? Cannabis use disorder, moderate, dependence (HCC) F12.20   ??? Cervical facet syndrome M47.812   ??? Chronic bilateral low back pain without sciatica M54.5, G89.29   ??? Chronic hepatitis C (HCC) B18.2   ??? Chronic musculoskeletal pain M79.18, G89.29   ???  Chronic pain of both knees M25.561, M25.562, G89.29   ??? Cirrhosis of liver (HCC) K74.60   ??? Cocaine abuse (HCC) F14.10   ??? COPD (chronic obstructive pulmonary disease) (HCC) J44.9   ??? GERD (gastroesophageal reflux disease) K21.9   ??? Schizophrenia (HCC) F20.9   ??? 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 Marital status: SINGLE     Spouse name: Not on file   ??? Number of children: Not on file   ??? Years of education: Not on file   ??? Highest education level: Not on file   Occupational History   ??? Not on file   Social Needs   ??? Financial resource strain: Not on file   ??? Food insecurity:     Worry: Not on file     Inability: Not on file   ??? Transportation needs:     Medical: Not on file     Non-medical: Not on file   Tobacco Use   ??? Smoking status: Former Smoker     Types: Cigarettes   ??? Smokeless tobacco: Never Used   Substance and Sexual Activity   ??? Alcohol use: Not Currently   ??? Drug use: Not on file    ??? Sexual activity: Not on file   Lifestyle   ??? Physical activity:     Days per week: Not on file     Minutes per session: Not on file   ??? Stress: Not on file   Relationships   ??? Social connections:     Talks on phone: Not on file     Gets together: Not on file     Attends religious service: Not on file     Active member of club or organization: Not on file     Attends meetings of clubs or organizations: Not on file     Relationship status: Not on file   ??? Intimate partner violence:     Fear of current or ex partner: Not on file     Emotionally abused: Not on file     Physically abused: Not on file     Forced sexual activity: Not on file   Other Topics Concern   ??? Not on file   Social History Narrative   ??? Not on file       Review of Systems   Constitutional: Negative for chills and fever.   Genitourinary:        Positive for testicular swelling.   Musculoskeletal: Positive for back pain, joint pain and neck pain.   Skin:        Positive for bump felt on buttocks. No itching, discharge, or pain.   Neurological: Negative for sensory change and weakness.         Objective:     Visit Vitals  BP 121/74 (BP 1 Location: Left arm, BP Patient Position: Sitting)   Pulse 63   Temp 97.3 ??F (36.3 ??C) (Oral)   Resp 16   Ht 6\' 4"  (1.93 m)   Wt 209 lb (94.8 kg)   SpO2 99%   BMI 25.44 kg/m??          Physical Exam  Constitutional:       Appearance: Normal appearance.   Musculoskeletal:         General: Swelling present.      Right lower leg: No edema.  Left lower leg: No edema.   Skin:     Comments: Waxy, hypopigmented lesion on R buttocks with "stuck-on" appearance   Neurological:      Mental Status: He is alert.         Pertinent Labs/Studies: none      Assessment and orders:       ICD-10-CM ICD-9-CM    1. Seborrheic keratoses L82.1 702.19    2. Testicular swelling, right N50.89 608.86    3. Primary osteoarthritis of right knee M17.11 715.16    4. Coronary artery disease involving native coronary artery of native  heart without angina pectoris I25.10 414.01      Encounter Diagnoses   Name Primary?   ??? Seborrheic keratoses Yes   ??? Testicular swelling, right    ??? Primary osteoarthritis of right knee    ??? Coronary artery disease involving native coronary artery of native heart without angina pectoris      Diagnoses and all orders for this visit:    1. Seborrheic keratoses - skin lesion on R buttocks consistent with seborrheic keratoses. Reassured patient and provided reading materials on subject.    2. Testicular swelling, right - this is persistent after two rounds of antibiotics. Pt has upcoming urology appointment next week for this.    3. Primary osteoarthritis of right knee - pt has upcoming appointment next week.    4. Coronary artery disease involving native coronary artery of native heart without angina pectoris - pt has upcoming stress test next week.    5. Chronic back pain - pt has had back pain and spinal fusion. He has severe stenosis proximal to his prior surgical spot. MRI reviewed with this information. Will refer to spinal orthopedics in addition as his knee doctor states that he will not be seen for this.      Follow-up and Dispositions    ?? Return if symptoms worsen or fail to improve.           I have discussed the diagnosis with the patient and the intended plan as seen in the above orders.  Social history, medical history, and labs were reviewed.  The patient has received an after-visit summary and questions were answered concerning future plans.  I have discussed medication side effects and warnings with the patient as well.    Judithann Graves, MD  Resident [facility]  09/18/18

## 2018-09-18 NOTE — Patient Instructions (Signed)
Seborrheic Keratosis: Care Instructions  Your Care Instructions  Seborrheic keratoses are raised skin growths that look scaly or warty. They usually look like they were stuck onto the skin. They most often grow in groups on the back or chest and are more common in older people. A seborrheic keratosis can be tan or dark brown. A seborrheic keratosis is not a mole and is almost always harmless. But it is still a good idea to check your skin regularly.  Sometimes a seborrheic keratosis can itch. Scratching it can cause it to bleed and sometimes even scar.  A seborrheic keratosis is removed only if it bothers you. The doctor will freeze it or scrape it off with a tool. The doctor can also use a laser to remove a seborrheic keratosis. Treatment usually results in normal-looking skin, but it can leave a light or dark mark or even a scar on the skin.  Follow-up care is a key part of your treatment and safety. Be sure to make and go to all appointments, and call your doctor if you are having problems. It's also a good idea to know your test results and keep a list of the medicines you take.  How can you care for yourself at home?  ?? If clothing irritates your seborrheic keratosis, cover it with a bandage to prevent rubbing and bleeding.  ?? If you have a seborrheic keratosis removed, clean the area with soap and water two times a day unless your doctor gives you different instructions. Don't use hydrogen peroxide or alcohol, which can slow healing.  ? You may cover the wound with a thin layer of petroleum jelly, such as Vaseline, and a nonstick bandage.  ?? Check all the skin on your body once a month for skin growths or other changes, such as color and feel of the skin.  ? Stand in front of a full-length mirror. Look carefully at the front and back of your body. Then look at your right and left sides with your arms raised.  ? Bend your elbows and look carefully at your forearms, the back of your  upper arms, and your palms.  ? Look at your feet, the soles of your feet, and the spaces between your toes.  ? Use a hand mirror to look at the back of your legs, the back of your neck, and your back, rear end (buttocks), and genital area. Part the hair on your head to look at your scalp.  ?? If you see a change in a skin growth, contact your doctor. Look for:  ? A mole that bleeds.  ? A fast-growing mole.  ? A scaly or crusted growth on the skin.  ? A sore that will not heal.  When should you call for help?  Call your doctor now or seek immediate medical care if:  ?? ?? You have an area of normal skin that suddenly changes in shape, size, or how it looks.   ?? ?? Your skin is badly broken from scratching.   ?? ?? You have signs of infection such as:  ? Pain, warmth, or swelling in your skin.  ? Red streaks near a wound in your skin.  ? Pus coming from a wound in your skin.  ? A fever not due to the flu or other illness.   ??Watch closely for changes in your health, and be sure to contact your doctor if:  ?? ?? You do not get better as expected.   Where   can you learn more?  Go to http://www.healthwise.net/[employer].  Enter Z945 in the search box to learn more about "Seborrheic Keratosis: Care Instructions."  Current as of: October 23, 2017  Content Version: 12.2  ?? 2006-2019 [employer]. Care instructions adapted under license by [employer] (which disclaims liability or warranty for this information). If you have questions about a medical condition or this instruction, always ask your healthcare professional. Healthwise, Incorporated disclaims any warranty or liability for your use of this information.

## 2018-09-18 NOTE — Progress Notes (Signed)
 1. Have you been to the ER, urgent care clinic since your last visit?  Hospitalized since your last visit? no    2. Have you seen or consulted any other health care providers outside of the [facility] since your last visit?  Include any pap smears or colon screening. no  Reviewed record in preparation for visit and have obtained necessary documentation.  Patient did not bring medications to visit for review.  Information provided on Advanced Directive, Living Will.  Body mass index is 25.44 kg/m.   Health Maintenance Due   Topic Date Due   . Shingrix Vaccine Age 76> (1 of 2) 08/10/2010   . FOBT Q1Y Age 51-75  08/10/2010

## 2018-09-18 NOTE — Progress Notes (Signed)
[facility]    Subjective:   David Floyd is a 58 y.o. male with history of CAD, hepatitis C, COPD, GERD, PTSD, RLS, BPH  CC: testicular swelling and knee pain  History provided by patient    HPI:  Pt has a stress test and echo on 3/3 with Dr. Sid Falcon, pt's cardiologist.    He has an appointment with the urologist on March 4.     He has an appointment with ophthalmology on 3/9.    Pt has a knee appointment with Dr. Matthew Folks on 3/11. His fax number is [phone]. He needs a referral for the back.    Pt has a spot on his buttocks that he noticed. He states it is a bump. It does not cause him any symptoms. There is no discharge. He feels like it has been there before and went away. It has come back. He recently had STI screening that was completely negative.    PFSH:     Current Outpatient Medications on File Prior to Visit   Medication Sig Dispense Refill   ??? acetaminophen (TYLENOL ARTHRITIS PAIN) 650 mg TbER Take 650 mg by mouth every eight (8) hours.     ??? albuterol (PROVENTIL HFA, VENTOLIN HFA, PROAIR HFA) 90 mcg/actuation inhaler Take 2 Puffs by inhalation every four (4) hours as needed for Wheezing. 1 Inhaler 0   ??? tamsulosin (FLOMAX) 0.4 mg capsule Take 1 Cap by mouth daily. 30 Cap 5   ??? cetirizine (ZYRTEC) 10 mg tablet Take 1 Tab by mouth daily. 30 Tab 0   ??? omeprazole (PRILOSEC) 40 mg capsule Take 1 Cap by mouth daily. 30 Cap 5   ??? cyclobenzaprine (FLEXERIL) 10 mg tablet Take 1 Tab by mouth three (3) times daily as needed for Muscle Spasm(s). 90 Tab 1   ??? aspirin 81 mg chewable tablet Take 81 mg by mouth daily.     ??? nitroglycerin (NITROSTAT) 0.4 mg SL tablet 0.4 mg by SubLINGual route.     ??? isosorbide dinitrate (ISORDIL) 30 mg tablet Take 1 Tab by mouth daily. 30 Tab 1   ??? ticagrelor (BRILINTA) 60 mg tab tablet Take 1 Tab by mouth two (2) times a day. 60 Tab 3   ??? prazosin (MINIPRESS) 1 mg capsule Take 3 Caps by mouth nightly. 90 Cap 3   ??? atorvastatin (LIPITOR) 40 mg tablet Take 1  Tab by mouth daily. 30 Tab 1   ??? ranolazine ER (RANEXA) 1,000 mg Take 1 Tab by mouth two (2) times a day. 60 Tab 1   ??? rOPINIRole (REQUIP) 0.5 mg tablet Take 1 Tab by mouth three (3) times daily. 90 Tab 5   ??? clopidogrel (PLAVIX) 75 mg tab Take  by mouth.     ??? diclofenac (VOLTAREN) 1 % gel Apply 4 g to affected area four (4) times daily. 1 Each 1     No current facility-administered medications on file prior to visit.        Patient Active Problem List   Diagnosis Code   ??? Abnormal ejaculation N53.19   ??? Atherosclerosis of coronary artery I25.10   ??? Bipolar 1 disorder (HCC) F31.9   ??? BPH (benign prostatic hyperplasia) N40.0   ??? Cannabis use disorder, moderate, dependence (HCC) F12.20   ??? Cervical facet syndrome M47.812   ??? Chronic bilateral low back pain without sciatica M54.5, G89.29   ??? Chronic hepatitis C (HCC) B18.2   ??? Chronic musculoskeletal pain M79.18, G89.29   ???  Chronic pain of both knees M25.561, M25.562, G89.29   ??? Cirrhosis of liver (HCC) K74.60   ??? Cocaine abuse (HCC) F14.10   ??? COPD (chronic obstructive pulmonary disease) (HCC) J44.9   ??? GERD (gastroesophageal reflux disease) K21.9   ??? Schizophrenia (HCC) F20.9   ??? 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 Marital status: SINGLE     Spouse name: Not on file   ??? Number of children: Not on file   ??? Years of education: Not on file   ??? Highest education level: Not on file   Occupational History   ??? Not on file   Social Needs   ??? Financial resource strain: Not on file   ??? Food insecurity:     Worry: Not on file     Inability: Not on file   ??? Transportation needs:     Medical: Not on file     Non-medical: Not on file   Tobacco Use   ??? Smoking status: Former Smoker     Types: Cigarettes   ??? Smokeless tobacco: Never Used   Substance and Sexual Activity   ??? Alcohol use: Not Currently   ??? Drug use: Not on file   ??? Sexual activity: Not on file   Lifestyle   ??? Physical activity:      Days per week: Not on file     Minutes per session: Not on file   ??? Stress: Not on file   Relationships   ??? Social connections:     Talks on phone: Not on file     Gets together: Not on file     Attends religious service: Not on file     Active member of club or organization: Not on file     Attends meetings of clubs or organizations: Not on file     Relationship status: Not on file   ??? Intimate partner violence:     Fear of current or ex partner: Not on file     Emotionally abused: Not on file     Physically abused: Not on file     Forced sexual activity: Not on file   Other Topics Concern   ??? Not on file   Social History Narrative   ??? Not on file       Review of Systems   Constitutional: Negative for chills and fever.   Genitourinary:        Positive for testicular swelling.   Musculoskeletal: Positive for back pain, joint pain and neck pain.   Skin:        Positive for bump felt on buttocks. No itching, discharge, or pain.   Neurological: Negative for sensory change and weakness.         Objective:     Visit Vitals  BP 121/74 (BP 1 Location: Left arm, BP Patient Position: Sitting)   Pulse 63   Temp 97.3 ??F (36.3 ??C) (Oral)   Resp 16   Ht 6\' 4"  (1.93 m)   Wt 209 lb (94.8 kg)   SpO2 99%   BMI 25.44 kg/m??          Physical Exam  Constitutional:       Appearance: Normal appearance.   Musculoskeletal:         General: Swelling present.      Right lower leg: No edema.  Left lower leg: No edema.   Skin:     Comments: Waxy, hypopigmented lesion on R buttocks with "stuck-on" appearance   Neurological:      Mental Status: He is alert.         Pertinent Labs/Studies: none      Assessment and orders:       ICD-10-CM ICD-9-CM    1. Seborrheic keratoses L82.1 702.19    2. Testicular swelling, right N50.89 608.86    3. Primary osteoarthritis of right knee M17.11 715.16    4. Coronary artery disease involving native coronary artery of native heart without angina pectoris I25.10 414.01      Encounter Diagnoses   Name Primary?    ??? Seborrheic keratoses Yes   ??? Testicular swelling, right    ??? Primary osteoarthritis of right knee    ??? Coronary artery disease involving native coronary artery of native heart without angina pectoris      Diagnoses and all orders for this visit:    1. Seborrheic keratoses - skin lesion on R buttocks consistent with seborrheic keratoses. Reassured patient and provided reading materials on subject.    2. Testicular swelling, right - this is persistent after two rounds of antibiotics. Pt has upcoming urology appointment next week for this.    3. Primary osteoarthritis of right knee - pt has upcoming appointment next week.    4. Coronary artery disease involving native coronary artery of native heart without angina pectoris - pt has upcoming stress test next week.    5. Chronic back pain - pt has had back pain and spinal fusion. He has severe stenosis proximal to his prior surgical spot. MRI reviewed with this information. Will refer to spinal orthopedics in addition as his knee doctor states that he will not be seen for this.      Follow-up and Dispositions    ?? Return if symptoms worsen or fail to improve.           I have discussed the diagnosis with the patient and the intended plan as seen in the above orders.  Social history, medical history, and labs were reviewed.  The patient has received an after-visit summary and questions were answered concerning future plans.  I have discussed medication side effects and warnings with the patient as well.    Judithann Graves, MD  Resident [facility]  09/18/18

## 2018-09-18 NOTE — Addendum Note (Signed)
Addendum Note by Eugene Garnet, MD at 09/18/18 1300                Author: Eugene Garnet, MD  Service: --  Author Type: Physician       Filed: 09/21/18 [id number]  Encounter Date: 09/18/2018  Status: Signed          Editor: Eugene Garnet, MD (Physician)          Addended by: Eugene Garnet on: 09/21/2018 09:29 PM    Modules accepted: Level of Service

## 2018-09-25 ENCOUNTER — Encounter: Primary: Student in an Organized Health Care Education/Training Program

## 2018-10-03 ENCOUNTER — Encounter: Attending: Specialist | Primary: Student in an Organized Health Care Education/Training Program

## 2018-11-15 NOTE — Telephone Encounter (Signed)
-----   Message from Frances Furbish sent at 11/15/2018  2:58 PM EDT -----  Regarding: Dr. Gavin Pound: [phone]  Caller's first and last name:   Reason for call: Medical records   Callback required yes/no and why: yes, would like to talk to Dr. Steele Berg  Best contact number(s): [phone]  Details to clarify the request:  Pt requesting records be transferred to:    Dr. Toy Baker at [facility]  [address]  [place], [place],[address]  Phone: [phone]  Fax: [phone]    PT also requesting call back from Dr. Steele Berg. No further details discussed.

## 2018-11-15 NOTE — Telephone Encounter (Signed)
Returned call. Pt wanted to inform me that he moved back to [place] last month to be closer to family.    Esmond Camper, MD  Family Medicine Resident

## 2018-11-17 IMAGING — DX DG WRIST COMPLETE 3+V*R*
4 series · 4 of 4 positions shown · non-contrast
Comparison: None.

CLINICAL DATA: Swelling of right wrist and both hands.

EXAM:
RIGHT WRIST - COMPLETE 3+ VIEW

[wrist ap (1 of 2)]
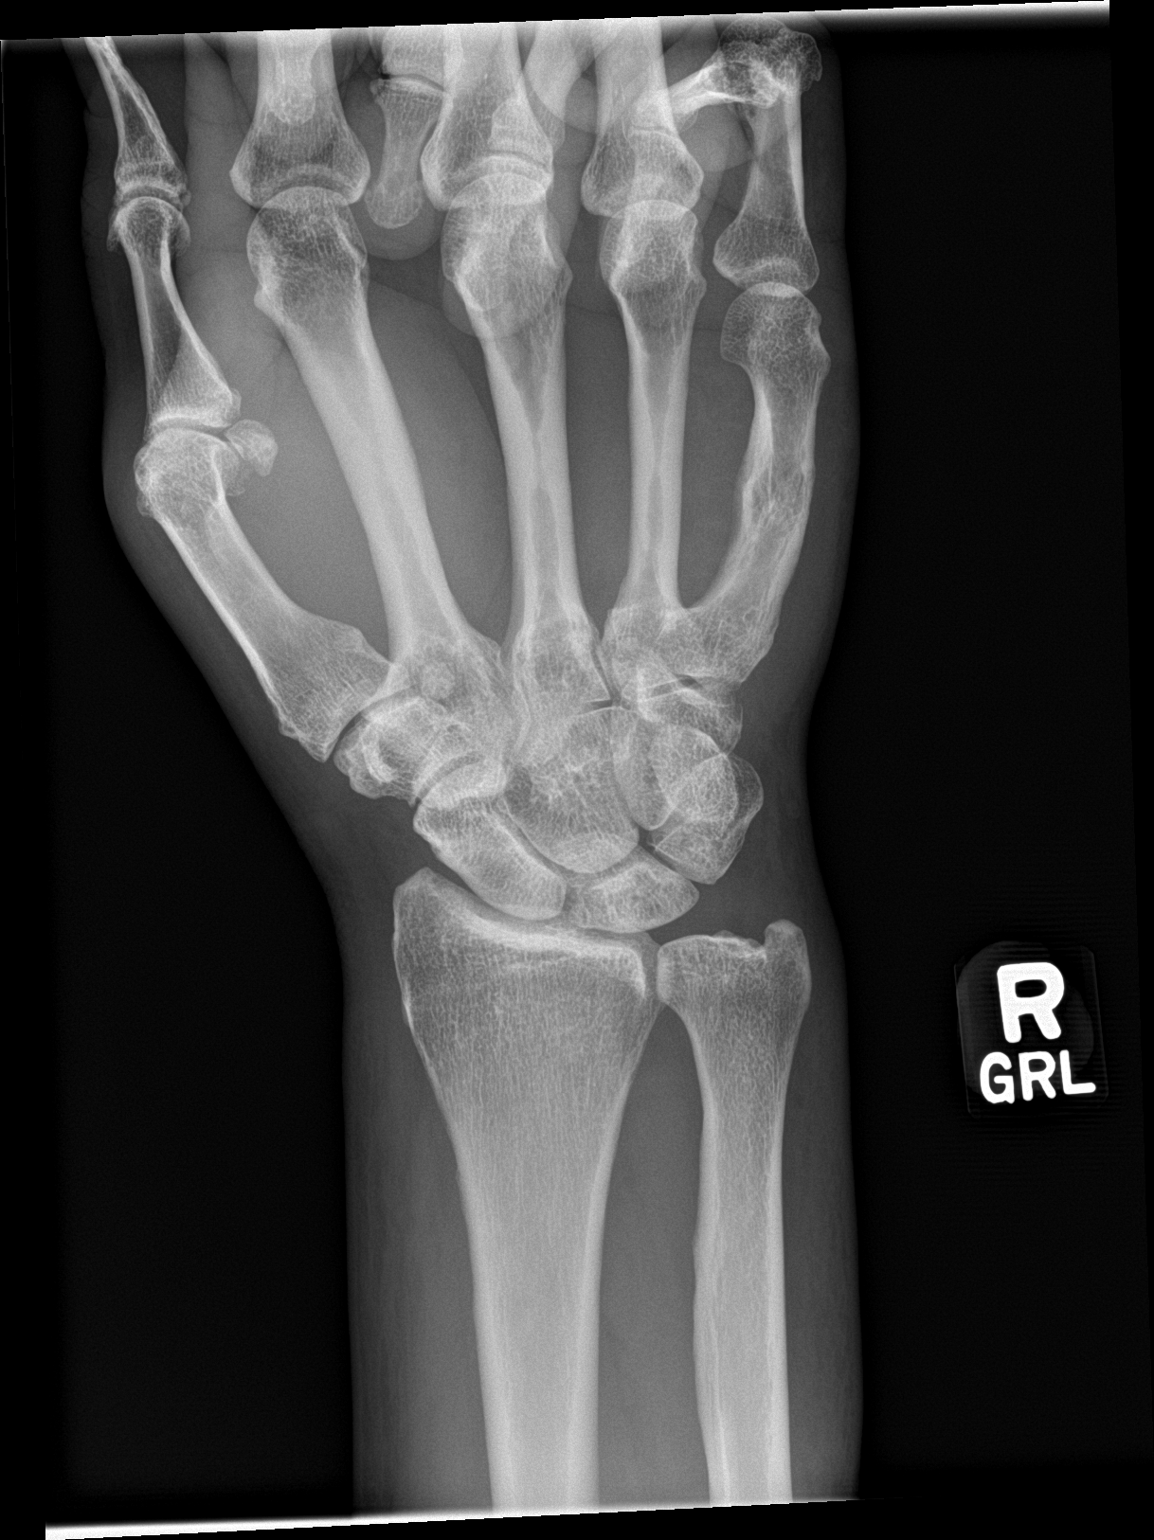

[wrist obl]
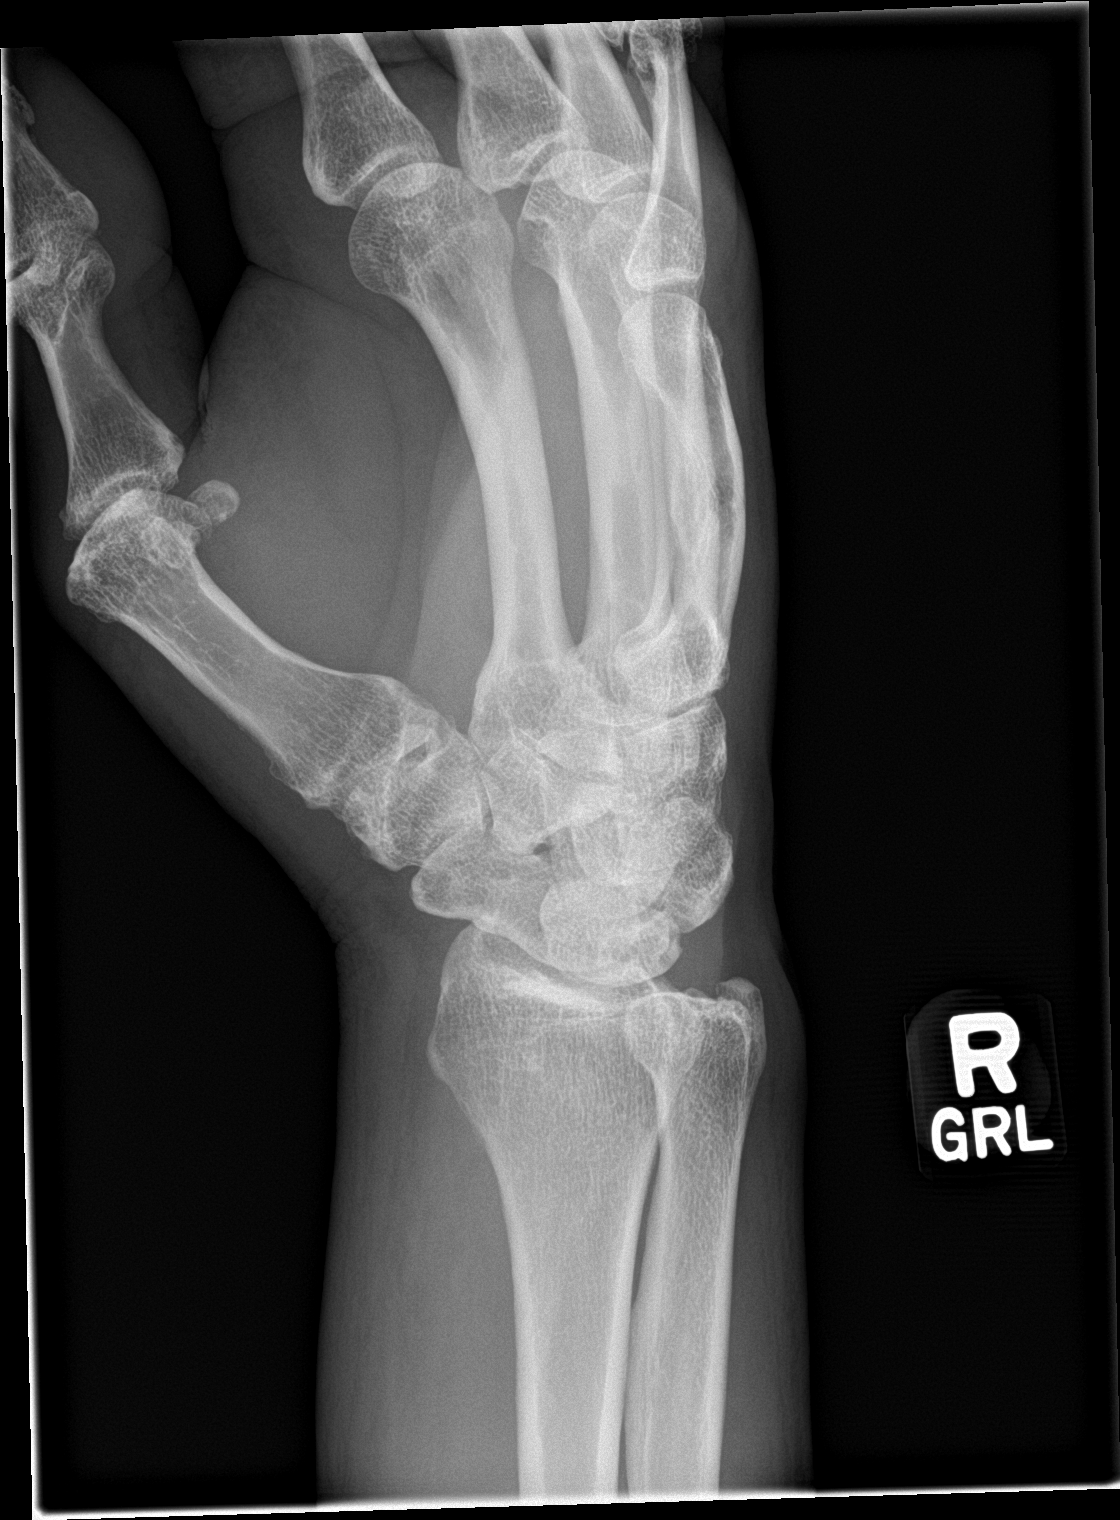

[wrist lat]
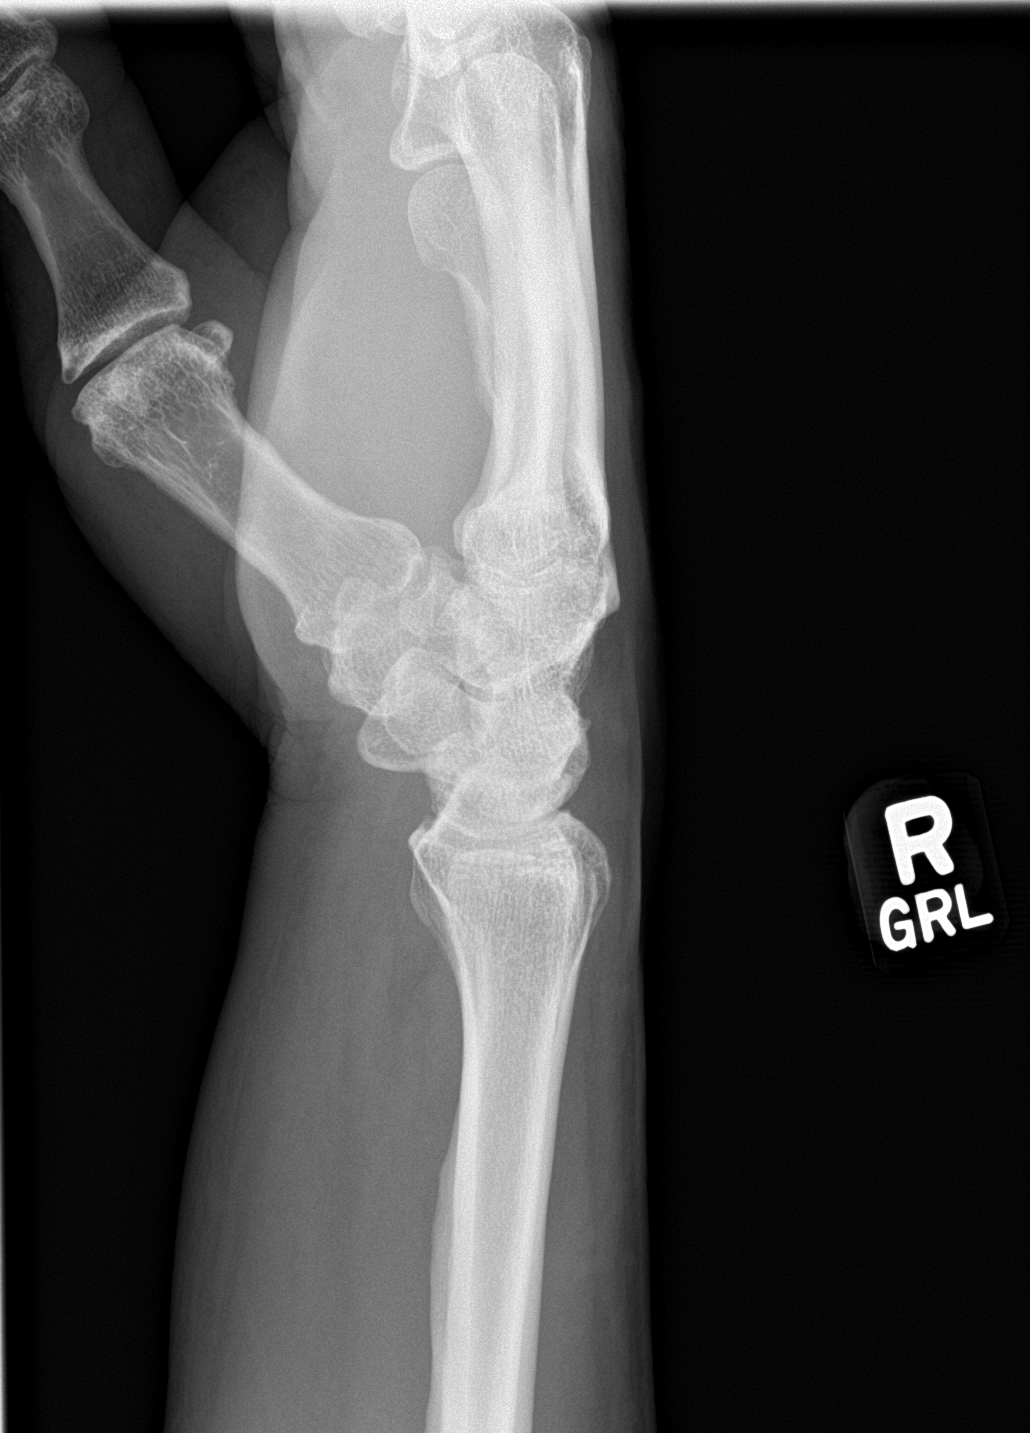

[wrist ap (2 of 2)]
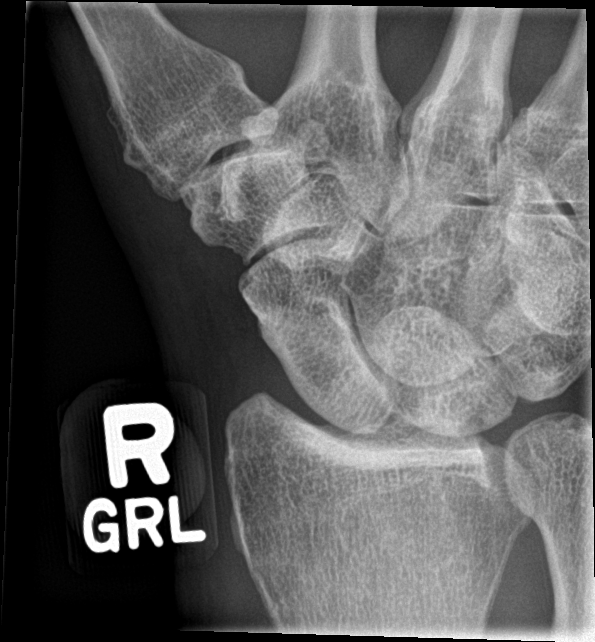

[4 of 4 positions shown; findings below may reference images not displayed]

FINDINGS: There is no evidence of fracture or dislocation. Mild osteoarthritis
noted of the wrist. No bony lesions or destruction.
IMPRESSION: Osteoarthritis.  No acute findings.

## 2018-11-17 IMAGING — DX DG HAND COMPLETE 3+V*R*
3 series · 3 of 3 positions shown · non-contrast
Comparison: None.

CLINICAL DATA: Swelling of right wrist and both hands.

EXAM:
RIGHT HAND - COMPLETE 3+ VIEW

[hand ap]
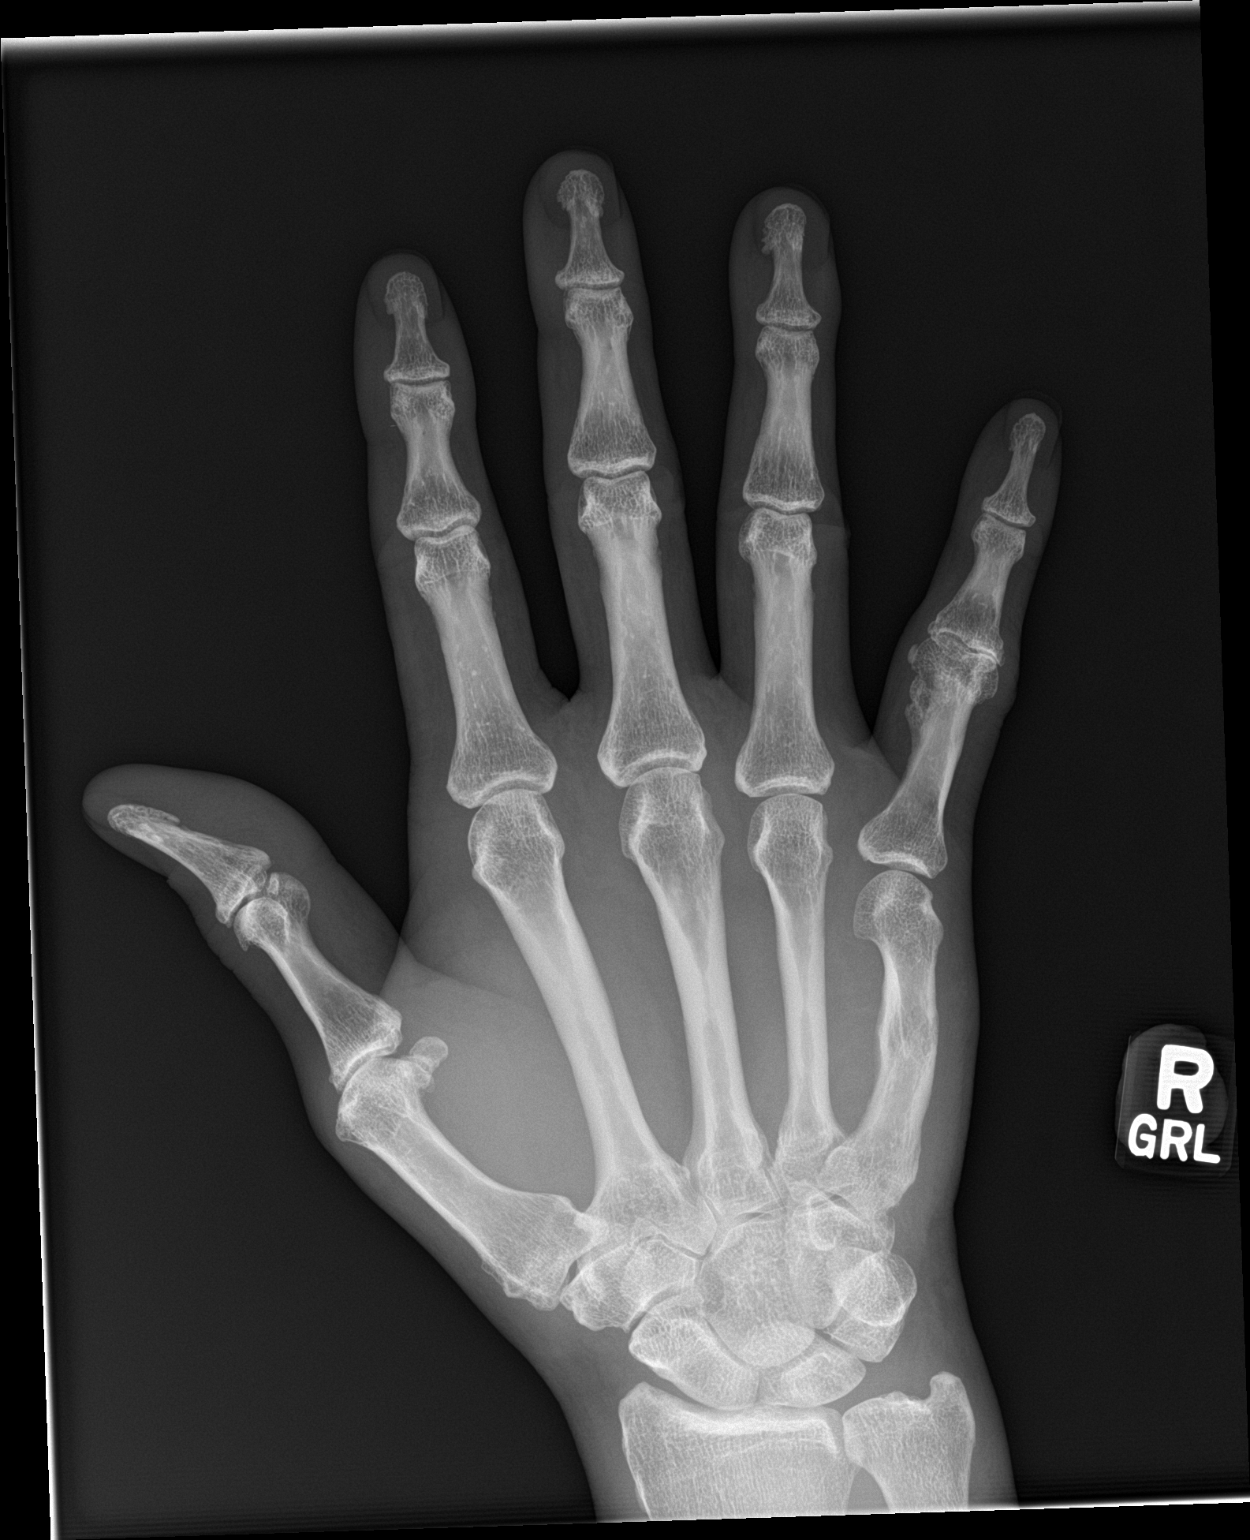

[hand obl]
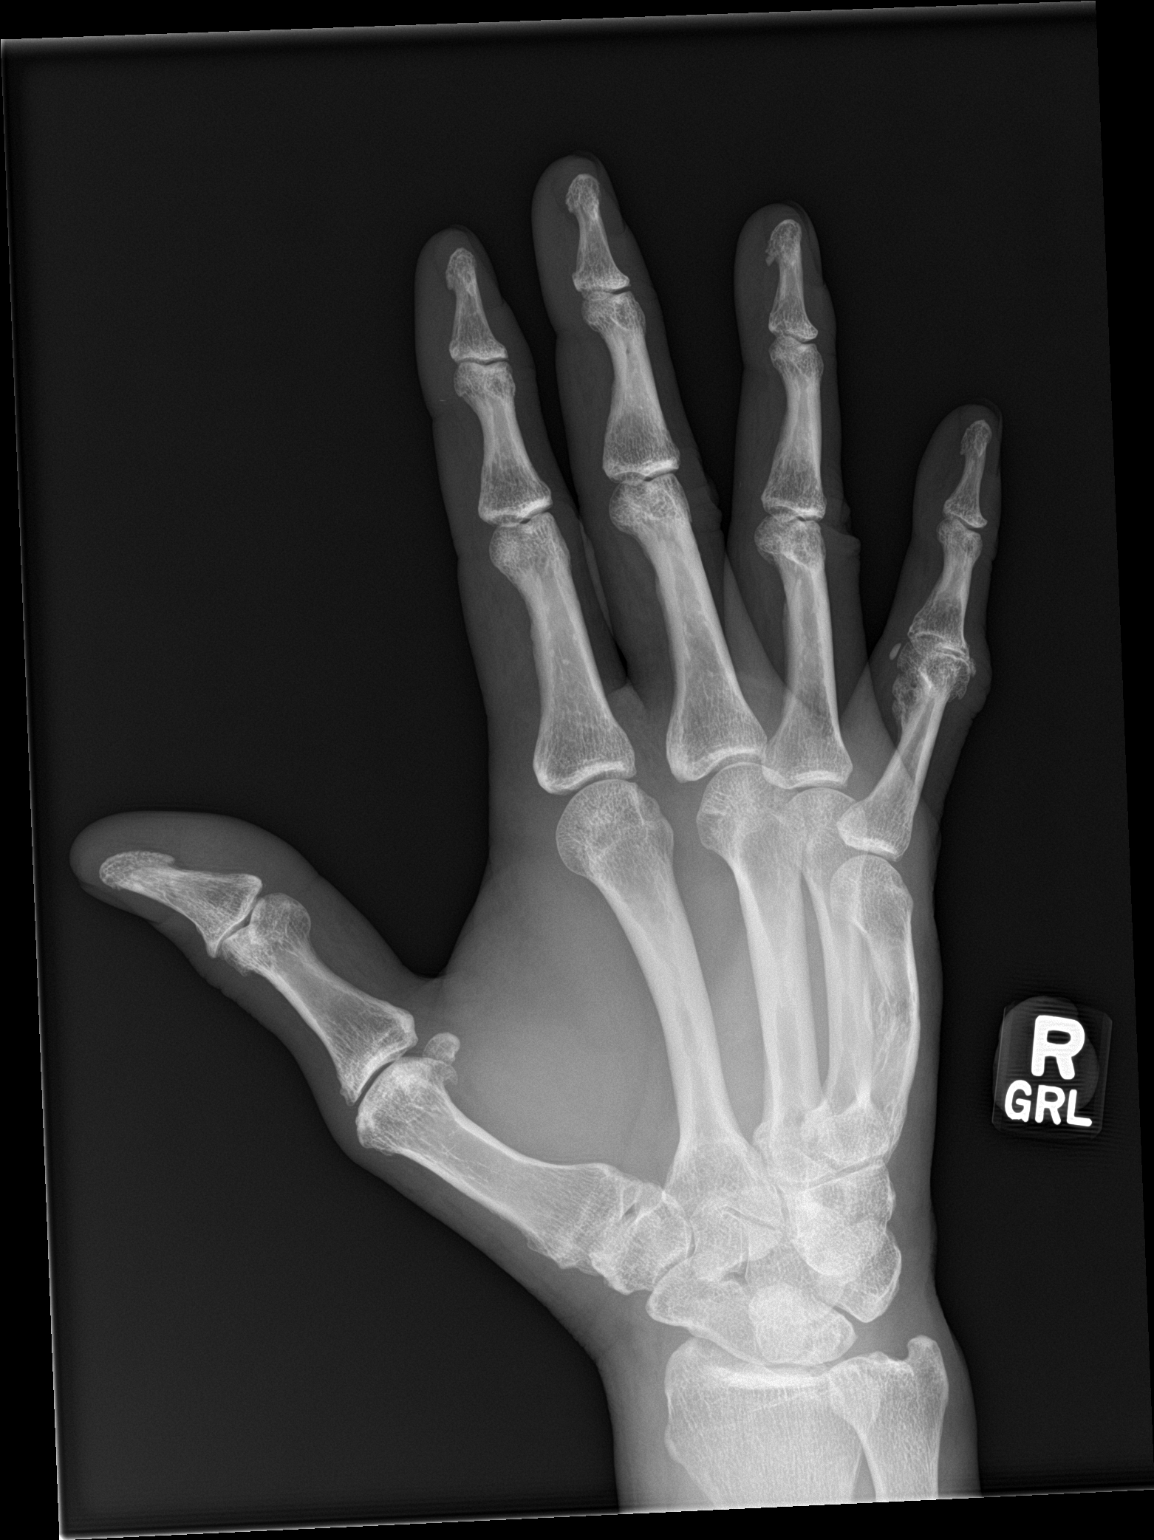

[hand lat]
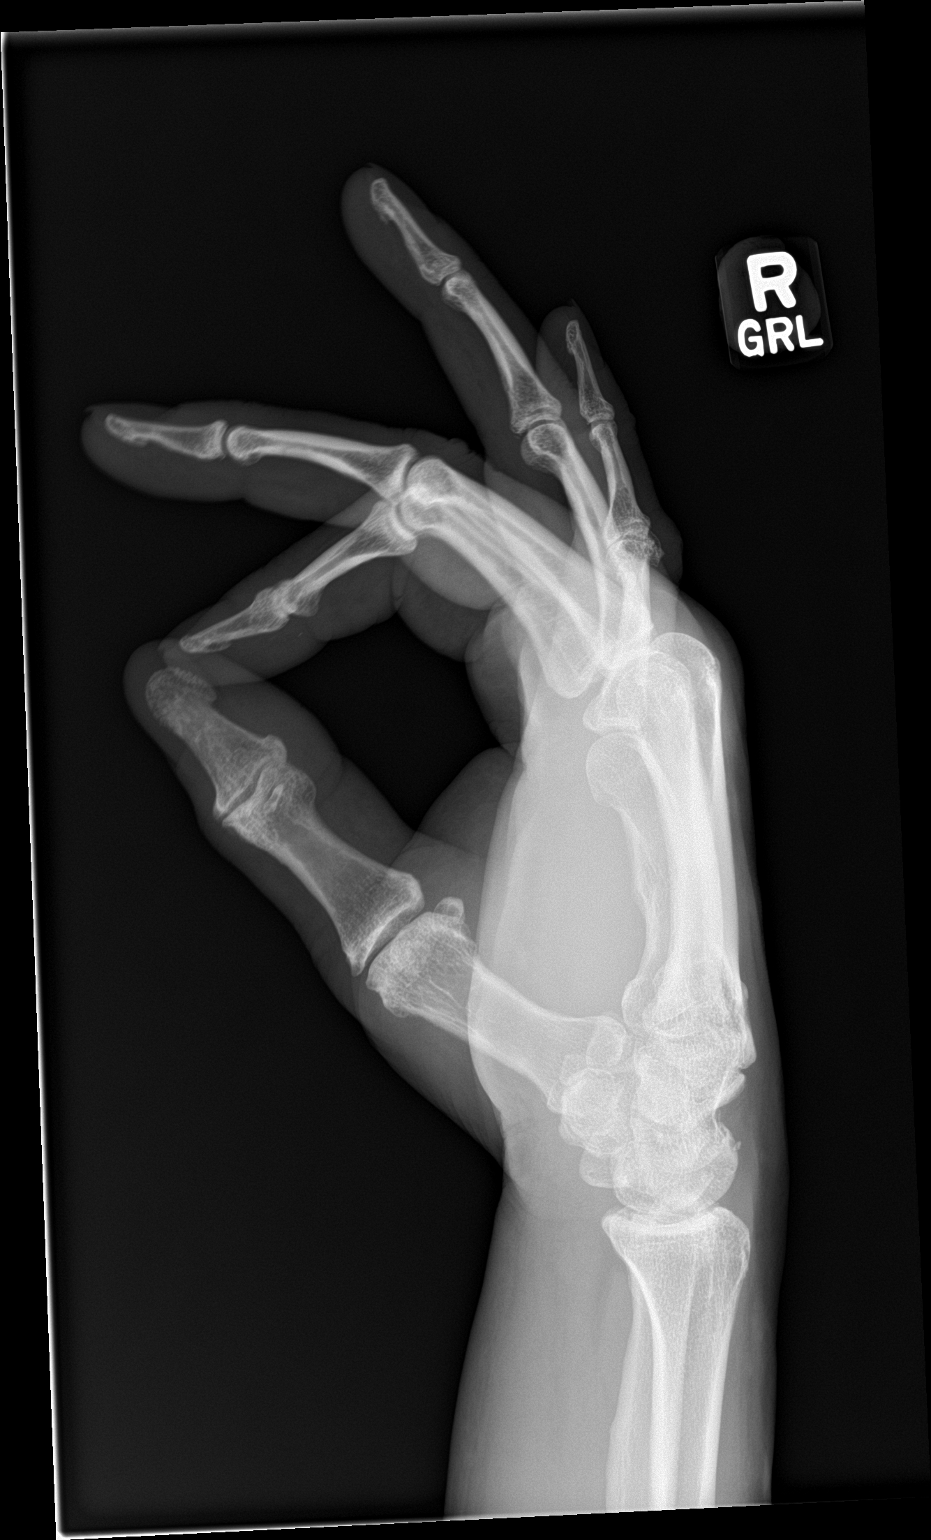

[3 of 3 positions shown; findings below may reference images not displayed]

FINDINGS: No acute fracture or dislocation identified. There is an old, healed
fracture of the mid fifth metacarpal. Probable fracture also of the
distal proximal phalanx. Mild osteoarthritis noted of the
interphalangeal joints and the first CMC joint. No bony lesions.
IMPRESSION: No acute findings. Old injuries of the fifth metacarpal and distal
proximal phalanx. Mild osteoarthritis.

## 2018-11-23 ENCOUNTER — Emergency Department (HOSPITAL_COMMUNITY): Payer: Medicaid Other

## 2018-11-23 ENCOUNTER — Emergency Department (HOSPITAL_COMMUNITY)
Admission: EM | Admit: 2018-11-23 | Discharge: 2018-11-23 | Disposition: A | Discharge status: Home / Self Care | Payer: Medicaid Other | Attending: Emergency Medicine | Admitting: Emergency Medicine

## 2018-11-23 ENCOUNTER — Encounter (HOSPITAL_COMMUNITY): Payer: Self-pay

## 2018-11-23 ENCOUNTER — Other Ambulatory Visit: Payer: Self-pay

## 2018-11-23 DIAGNOSIS — J449 Chronic obstructive pulmonary disease, unspecified: Secondary | ICD-10-CM | POA: Diagnosis not present

## 2018-11-23 DIAGNOSIS — Y999 Unspecified external cause status: Secondary | ICD-10-CM | POA: Diagnosis not present

## 2018-11-23 DIAGNOSIS — S301XXA Contusion of abdominal wall, initial encounter: Secondary | ICD-10-CM

## 2018-11-23 DIAGNOSIS — Z7901 Long term (current) use of anticoagulants: Secondary | ICD-10-CM | POA: Insufficient documentation

## 2018-11-23 DIAGNOSIS — Z87891 Personal history of nicotine dependence: Secondary | ICD-10-CM | POA: Insufficient documentation

## 2018-11-23 DIAGNOSIS — I2510 Atherosclerotic heart disease of native coronary artery without angina pectoris: Secondary | ICD-10-CM | POA: Insufficient documentation

## 2018-11-23 DIAGNOSIS — S199XXA Unspecified injury of neck, initial encounter: Secondary | ICD-10-CM | POA: Diagnosis present

## 2018-11-23 DIAGNOSIS — S161XXA Strain of muscle, fascia and tendon at neck level, initial encounter: Secondary | ICD-10-CM | POA: Diagnosis not present

## 2018-11-23 DIAGNOSIS — Y92410 Unspecified street and highway as the place of occurrence of the external cause: Secondary | ICD-10-CM | POA: Insufficient documentation

## 2018-11-23 DIAGNOSIS — Z79899 Other long term (current) drug therapy: Secondary | ICD-10-CM | POA: Insufficient documentation

## 2018-11-23 DIAGNOSIS — I252 Old myocardial infarction: Secondary | ICD-10-CM | POA: Diagnosis not present

## 2018-11-23 DIAGNOSIS — S39012A Strain of muscle, fascia and tendon of lower back, initial encounter: Secondary | ICD-10-CM

## 2018-11-23 DIAGNOSIS — Y9389 Activity, other specified: Secondary | ICD-10-CM | POA: Diagnosis not present

## 2018-11-23 LAB — URINALYSIS, ROUTINE W REFLEX MICROSCOPIC
Bacteria, UA: NONE SEEN
Bilirubin Urine: NEGATIVE
Glucose, UA: NEGATIVE mg/dL
Ketones, ur: NEGATIVE mg/dL
Leukocytes,Ua: NEGATIVE
Nitrite: NEGATIVE
Protein, ur: NEGATIVE mg/dL
Specific Gravity, Urine: 1.008 (ref 1.005–1.030)
pH: 5 (ref 5.0–8.0)

## 2018-11-23 LAB — BASIC METABOLIC PANEL
Anion gap: 12 (ref 5–15)
BUN: 11 mg/dL (ref 6–20)
CO2: 20 mmol/L — ABNORMAL LOW (ref 22–32)
Calcium: 8.8 mg/dL — ABNORMAL LOW (ref 8.9–10.3)
Chloride: 102 mmol/L (ref 98–111)
Creatinine, Ser: 0.85 mg/dL (ref 0.61–1.24)
GFR calc Af Amer: >60 mL/min (ref >60)
GFR calc non Af Amer: >60 mL/min (ref >60)
Glucose, Bld: 98 mg/dL (ref 70–99)
Potassium: 4.1 mmol/L (ref 3.5–5.1)
Sodium: 134 mmol/L — ABNORMAL LOW (ref 135–145)

## 2018-11-23 MED ORDER — IOHEXOL 300 MG/ML  SOLN
100.0000 mL | Freq: Once | INTRAMUSCULAR | Status: AC | PRN
  Administered 2018-11-23: 100 mL via INTRAVENOUS

## 2018-11-23 MED ORDER — PROCHLORPERAZINE EDISYLATE 10 MG/2ML IJ SOLN
5.0000 mg | Freq: Once | INTRAMUSCULAR | Status: AC
  Administered 2018-11-23: 5 mg via INTRAVENOUS
  Filled 2018-11-23: qty 2

## 2018-11-23 MED ORDER — HYDROMORPHONE HCL 1 MG/ML IJ SOLN
0.5000 mg | Freq: Once | INTRAMUSCULAR | Status: AC
  Administered 2018-11-23: 10:00:00 0.5 mg via INTRAVENOUS
  Filled 2018-11-23: qty 1
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
## 2018-11-23 NOTE — ED Notes (Signed)
Pt complaining of lower back pain and right knee pain. Conscious alert and oriented. NAD

## 2018-11-23 NOTE — ED Notes (Signed)
[place]
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
## 2018-11-23 NOTE — ED Notes (Signed)
Pt to xray

## 2018-11-30 ENCOUNTER — Other Ambulatory Visit: Payer: Self-pay | Admitting: Cardiovascular Disease

## 2018-11-30 DIAGNOSIS — I200 Unstable angina: Secondary | ICD-10-CM | POA: Insufficient documentation

## 2018-11-30 MED ORDER — SODIUM CHLORIDE 0.9% FLUSH
3.0000 mL | Freq: Two times a day (BID) | INTRAVENOUS | Status: DC
  Filled 2018-11-30: qty 3

## 2018-11-30 NOTE — Telephone Encounter (Signed)
Left message for patient to call  [phone] to reschedule nuclear and echo at [facility]. Will also need a follow up appointment with Dr. Gasper Lloyd.

## 2018-12-03 ENCOUNTER — Telehealth: Payer: Self-pay

## 2018-12-03 ENCOUNTER — Other Ambulatory Visit
Admission: RE | Admit: 2018-12-03 | Discharge: 2018-12-03 | Disposition: A | Discharge status: Home / Self Care | Payer: Medicaid Other | Source: Ambulatory Visit | Attending: Cardiovascular Disease | Admitting: Cardiovascular Disease

## 2018-12-03 ENCOUNTER — Other Ambulatory Visit: Payer: Self-pay

## 2018-12-03 DIAGNOSIS — Z1159 Encounter for screening for other viral diseases: Secondary | ICD-10-CM | POA: Insufficient documentation

## 2018-12-03 NOTE — Telephone Encounter (Signed)
Pt called in stating that his prover faxed over some paper work to his new pcp would like to confirm it was received please follow up

## 2018-12-04 LAB — NOVEL CORONAVIRUS, NAA (HOSP ORDER, SEND-OUT TO REF LAB; TAT 18-24 HRS): SARS-CoV-2, NAA: NOT DETECTED

## 2018-12-05 ENCOUNTER — Other Ambulatory Visit: Payer: Self-pay

## 2018-12-05 ENCOUNTER — Ambulatory Visit: Payer: Medicaid Other | Attending: Family Medicine | Admitting: Family Medicine

## 2018-12-05 NOTE — Telephone Encounter (Signed)
Patient was called and there is no VM set up to leave a message. Patient has appointment today with Dr. Margarita Rana

## 2018-12-06 ENCOUNTER — Encounter: Admission: RE | Payer: Self-pay | Source: Home / Self Care

## 2018-12-06 ENCOUNTER — Ambulatory Visit: Admission: RE | Admit: 2018-12-06 | Payer: Medicaid Other | Source: Home / Self Care | Admitting: Cardiovascular Disease

## 2018-12-06 SURGERY — LEFT HEART CATH AND CORONARY ANGIOGRAPHY
Anesthesia: Moderate Sedation | Laterality: Left

## 2018-12-10 ENCOUNTER — Other Ambulatory Visit: Payer: Self-pay | Admitting: Cardiovascular Disease

## 2018-12-10 ENCOUNTER — Other Ambulatory Visit: Payer: Self-pay

## 2018-12-10 ENCOUNTER — Encounter: Payer: Self-pay | Admitting: Family Medicine

## 2018-12-10 ENCOUNTER — Ambulatory Visit: Payer: No Typology Code available for payment source | Attending: Family Medicine | Admitting: Family Medicine

## 2018-12-10 DIAGNOSIS — I200 Unstable angina: Secondary | ICD-10-CM | POA: Diagnosis not present

## 2018-12-10 DIAGNOSIS — M5136 Other intervertebral disc degeneration, lumbar region: Secondary | ICD-10-CM | POA: Diagnosis not present

## 2018-12-10 DIAGNOSIS — F419 Anxiety disorder, unspecified: Secondary | ICD-10-CM | POA: Insufficient documentation

## 2018-12-10 DIAGNOSIS — F250 Schizoaffective disorder, bipolar type: Secondary | ICD-10-CM | POA: Diagnosis not present

## 2018-12-10 DIAGNOSIS — M23206 Derangement of unspecified meniscus due to old tear or injury, right knee: Secondary | ICD-10-CM

## 2018-12-10 MED ORDER — HYDROXYZINE HCL 25 MG PO TABS: 25.0000 mg | ORAL_TABLET | Freq: Three times a day (TID) | ORAL | 3 refills | Status: DC | PRN

## 2018-12-10 MED ORDER — CETIRIZINE HCL 10 MG PO TABS: 10.0000 mg | ORAL_TABLET | Freq: Every day | ORAL | 1 refills | Status: DC

## 2018-12-10 MED ORDER — OMEPRAZOLE 20 MG PO CPDR: 20.0000 mg | DELAYED_RELEASE_CAPSULE | Freq: Every day | ORAL | 3 refills | Status: DC

## 2018-12-10 MED ORDER — CYCLOBENZAPRINE HCL 10 MG PO TABS: 10.0000 mg | ORAL_TABLET | Freq: Two times a day (BID) | ORAL | 1 refills | Status: DC | PRN

## 2018-12-10 MED ORDER — DULOXETINE HCL 60 MG PO CPEP: 60.0000 mg | ORAL_CAPSULE | Freq: Every day | ORAL | 3 refills | Status: DC

## 2018-12-10 MED ORDER — TAMSULOSIN HCL 0.4 MG PO CAPS: 0.4000 mg | ORAL_CAPSULE | Freq: Every day | ORAL | 3 refills | Status: DC

## 2018-12-10 MED ORDER — NITROGLYCERIN 0.4 MG SL SUBL: 0.4000 mg | SUBLINGUAL_TABLET | SUBLINGUAL | 3 refills | Status: DC | PRN

## 2018-12-10 NOTE — Progress Notes (Signed)
Patient has been called and DOB has been verified. Patient has been screened and transferred to PCP to start phone visit.  REFILLS: Zyrtec,flexiril, nitrostat, omeprazole, Flomax.

## 2018-12-10 NOTE — Progress Notes (Signed)
Virtual Visit via Telephone Note  I connected with Keith Gibbs, on 12/10/2018 at 11:07 AM by telephone due to the COVID-19 pandemic and verified that I am speaking with the correct person using two identifiers.   Consent: I discussed the limitations, risks, security and privacy concerns of performing an evaluation and management service by telephone and the availability of in person appointments. I also discussed with the patient that there may be a patient responsible charge related to this service. The patient expressed understanding and agreed to proceed.   Location of Patient: Home  Location of Provider: Clinic   Persons participating in Telemedicine visit: Keith [name] [name] Gibbs Dr. Felecia Shelling     History of Present Illness: 58 year old male with a history of chronic pain from degenerative disc disease of the lumbar spine (previous history of lumbar laminectomy), coronary artery disease, schizoaffective disorder, PTSD, right knee meniscal tear, hepatitis C, COPD, GERD, BPH who was previously followed by Dr. Charyl Dancer in [place] and relocated to [place] (records from care everywhere reviewed)  Today he complains of pain in his right knee and his low back and has been out of Flexeril.  He informs me he was previously followed by pain management and would like to be referred to Dr. Humphrey Rolls of the pain clinic in [place].  He has an appointment with his orthopedics for evaluation of his right knee pain. He does have a lot of anxiety and is currently not on medications.  This stems from his multiple medical conditions, his fiance recently being diagnosed with cancer, financial stressors.  He endorses worrying a lot and having tremors which occur at rest and with intention.  Previously followed by psych and complains the medications that he was prescribed "messed with his head".  He needs something to calm down he states.   Past Medical History:  Diagnosis  Date  . Anxiety   . Arthritis   . Bursitis   . COPD (chronic obstructive pulmonary disease) ([place])   . Depression   . GERD (gastroesophageal reflux disease)   . Hepatitis C 2012   No longer has Hep C  . Hypertension   . Lung disease   . MI (myocardial infarction) ([place])   . Stroke [facility])    Allergies  Allergen Reactions  . Latuda [Lurasidone Hcl] Other (See Comments)    tremor   . Naproxen Hives and Swelling  . Saphris [Asenapine] Other (See Comments)    Increased tremors     Current Outpatient Medications on File Prior to Visit  Medication Sig Dispense Refill  . acetaminophen (TYLENOL) 650 MG CR tablet Take 1,300 mg by mouth every 8 (eight) hours.     Marland Kitchen albuterol (VENTOLIN HFA) 108 (90 Base) MCG/ACT inhaler Inhale 2 puffs into the lungs every 6 (six) hours as needed for wheezing or shortness of breath.    Marland Kitchen aspirin EC 81 MG tablet Take 81 mg by mouth daily.    Marland Kitchen atorvastatin (LIPITOR) 40 MG tablet Take 1 tablet (40 mg total) by mouth daily at 6 PM. (Patient taking differently: Take 40 mg by mouth daily. ) 30 tablet 1  . clopidogrel (PLAVIX) 75 MG tablet Take 75 mg by mouth daily.    . DULERA 200-5 MCG/ACT AERO TAKE 2 PUFFS BY MOUTH TWICE A DAY (Patient taking differently: Inhale 2 puffs into the lungs 2 (two) times a day. ) 1 Inhaler 1  . isosorbide mononitrate (IMDUR) 60 MG 24 hr tablet Take 60 mg by mouth  daily.    . nitroGLYCERIN (NITROSTAT) 0.4 MG SL tablet Place 1 tablet (0.4 mg total) under the tongue every 5 (five) minutes x 3 doses as needed for chest pain. 30 tablet 0  . tamsulosin (FLOMAX) 0.4 MG CAPS capsule Take 1 capsule (0.4 mg total) by mouth daily. 30 capsule 1  . prazosin (MINIPRESS) 1 MG capsule Take 3 capsules (3 mg total) by mouth at bedtime. (Patient not taking: Reported on 12/03/2018) 30 capsule 1   Current Facility-Administered Medications on File Prior to Visit  Medication Dose Route Frequency Provider Last Rate Last Dose  . sodium chloride flush (NS) 0.9  % injection 3 mL  3 mL Intravenous Q12H Jake Bathe, FNP        Observations/Objective: Awake, alert, oriented x3 Not in acute distress  Assessment and Plan: 1. DDD (degenerative disc disease), lumbar Previous history of lumbar spine fusion Uncontrolled Refilled Flexeril and placed on Cymbalta Previously followed by pain clinic-I will refer again - DULoxetine (CYMBALTA) 60 MG capsule; Take 1 capsule (60 mg total) by mouth daily.  Dispense: 30 capsule; Refill: 3 - Ambulatory referral to Pain Clinic  2. Schizoaffective disorder, bipolar type [facility]) Currently not followed by psychiatry and he declines referral   3. Anxiety Trial of hydroxyzine and if symptoms do not improve if hydroxyzine and Cymbalta I have referred him to psychiatry given underlying history of schizoaffective disorder - hydrOXYzine (ATARAX/VISTARIL) 25 MG tablet; Take 1 tablet (25 mg total) by mouth 3 (three) times daily as needed.  Dispense: 60 tablet; Refill: 3  4. Unstable angina (HCC) Scheduled for cardiac cath - nitroGLYCERIN (NITROSTAT) 0.4 MG SL tablet; Place 1 tablet (0.4 mg total) under the tongue every 5 (five) minutes x 3 doses as needed for chest pain.  Dispense: 30 tablet; Refill: 3  5. Old tear of meniscus of right knee, unspecified meniscus, unspecified tear type Uncontrolled He will be seeing his orthopedics later this afternoon   Follow Up Instructions: Return in about 1 month (around 01/10/2019).    I discussed the assessment and treatment plan with the patient. The patient was provided an opportunity to ask questions and all were answered. The patient agreed with the plan and demonstrated an understanding of the instructions.   The patient was advised to call back or seek an in-person evaluation if the symptoms worsen or if the condition fails to improve as anticipated.     I provided 31 minutes total of non-face-to-face time during this encounter including median intraservice time,  reviewing previous notes, labs, imaging, medications and explaining diagnosis and management.     Charlott Rakes, MD, FAAFP. [facility] and [place], [place]   12/10/2018, 11:07 AM

## 2019-03-20 ENCOUNTER — Other Ambulatory Visit: Payer: Self-pay | Admitting: Family Medicine

## 2019-04-18 ENCOUNTER — Other Ambulatory Visit: Payer: Self-pay | Admitting: Family Medicine

## 2019-04-22 ENCOUNTER — Other Ambulatory Visit: Payer: Self-pay | Admitting: Family Medicine

## 2019-05-20 ENCOUNTER — Other Ambulatory Visit: Payer: Self-pay | Admitting: Family Medicine

## 2019-05-20 DIAGNOSIS — F419 Anxiety disorder, unspecified: Secondary | ICD-10-CM

## 2019-05-28 ENCOUNTER — Ambulatory Visit: Payer: Medicaid Other | Attending: Family Medicine | Admitting: Family Medicine

## 2019-05-28 ENCOUNTER — Other Ambulatory Visit: Payer: Self-pay

## 2019-05-28 DIAGNOSIS — Z636 Dependent relative needing care at home: Secondary | ICD-10-CM

## 2019-05-28 DIAGNOSIS — M5136 Other intervertebral disc degeneration, lumbar region: Secondary | ICD-10-CM | POA: Diagnosis not present

## 2019-05-28 DIAGNOSIS — F32A Depression, unspecified: Secondary | ICD-10-CM

## 2019-05-28 DIAGNOSIS — J431 Panlobular emphysema: Secondary | ICD-10-CM | POA: Diagnosis not present

## 2019-05-28 DIAGNOSIS — F329 Major depressive disorder, single episode, unspecified: Secondary | ICD-10-CM

## 2019-05-28 DIAGNOSIS — F419 Anxiety disorder, unspecified: Secondary | ICD-10-CM

## 2019-05-28 DIAGNOSIS — K219 Gastro-esophageal reflux disease without esophagitis: Secondary | ICD-10-CM

## 2019-05-28 MED ORDER — GABAPENTIN 300 MG PO CAPS: 300.0000 mg | ORAL_CAPSULE | Freq: Two times a day (BID) | ORAL | 3 refills | Status: DC

## 2019-05-28 MED ORDER — ALBUTEROL SULFATE HFA 108 (90 BASE) MCG/ACT IN AERS: 2.0000 | INHALATION_SPRAY | Freq: Four times a day (QID) | RESPIRATORY_TRACT | 6 refills | Status: DC | PRN

## 2019-05-28 MED ORDER — DULOXETINE HCL 60 MG PO CPEP: 60.0000 mg | ORAL_CAPSULE | Freq: Every day | ORAL | 6 refills | Status: DC

## 2019-05-28 MED ORDER — OMEPRAZOLE 40 MG PO CPDR: 40.0000 mg | DELAYED_RELEASE_CAPSULE | Freq: Every day | ORAL | 6 refills | Status: DC

## 2019-05-28 MED ORDER — CYCLOBENZAPRINE HCL 10 MG PO TABS: 10.0000 mg | ORAL_TABLET | Freq: Two times a day (BID) | ORAL | 6 refills | Status: DC | PRN

## 2019-05-28 MED ORDER — DULERA 200-5 MCG/ACT IN AERO: INHALATION_SPRAY | RESPIRATORY_TRACT | 6 refills | Status: DC

## 2019-05-28 MED ORDER — TAMSULOSIN HCL 0.4 MG PO CAPS: 0.4000 mg | ORAL_CAPSULE | Freq: Every day | ORAL | 6 refills | Status: DC

## 2019-05-28 MED ORDER — HYDROXYZINE HCL 25 MG PO TABS: 25.0000 mg | ORAL_TABLET | Freq: Three times a day (TID) | ORAL | 3 refills | Status: DC | PRN

## 2019-05-28 NOTE — Progress Notes (Signed)
Virtual Visit via Telephone Note  I connected with Keith Gibbs, on 05/28/2019 at 4:32 PM by telephone due to the COVID-19 pandemic and verified that I am speaking with the correct person using two identifiers.   Consent: I discussed the limitations, risks, security and privacy concerns of performing an evaluation and management service by telephone and the availability of in person appointments. I also discussed with the patient that there may be a patient responsible charge related to this service. The patient expressed understanding and agreed to proceed.   Location of Patient: Home  Location of Provider: Clinic   Persons participating in Telemedicine visit: [place] W Edythe Lynn Farrington-CMA Dr. Felecia Shelling     History of Present Illness: 58 year old male with a history of chronic pain from degenerative disc disease of the lumbar spine (previous history of lumbar laminectomy), coronary artery disease, schizoaffective disorder, PTSD, right knee meniscal tear, hepatitis C, COPD, GERD, BPH    Wife has stage 4 breast cancer with metastasis to her brain, lung and he is a major caregiver.  This has him significantly stressed in his anxiety levels are elevated.  He does not think his symptoms are controlled on hydroxyzine and Cymbalta. GERD has been uncontrolled on his current dose of 20 mg of omeprazole.  He endorses eating late and going to sleep shortly after. Would like to be sent to Pain management for his chronic back and knee pains.  I had referred him but review of his chart indicates referral declined due to the fact that he had been discharged from the pain clinic. He is closely followed by orthopedics with his last visit on 04/2019 where he underwent right knee cortisone injection.  As per notes he is putting off surgery due to his wife's cancer.  Past Medical History:  Diagnosis Date  . Anxiety   . Arthritis   . Bursitis   . COPD (chronic obstructive pulmonary  disease) ([place])   . Depression   . GERD (gastroesophageal reflux disease)   . Hepatitis C 2012   No longer has Hep C  . Hypertension   . Lung disease   . MI (myocardial infarction) ([place])   . Stroke [facility])    Allergies  Allergen Reactions  . Latuda [Lurasidone Hcl] Other (See Comments)    tremor   . Naproxen Hives and Swelling  . Saphris [Asenapine] Other (See Comments)    Increased tremors     Current Outpatient Medications on File Prior to Visit  Medication Sig Dispense Refill  . acetaminophen (TYLENOL) 650 MG CR tablet Take 1,300 mg by mouth every 8 (eight) hours.     Marland Kitchen albuterol (VENTOLIN HFA) 108 (90 Base) MCG/ACT inhaler Inhale 2 puffs into the lungs every 6 (six) hours as needed for wheezing or shortness of breath.    Marland Kitchen aspirin EC 81 MG tablet Take 81 mg by mouth daily.    Marland Kitchen atorvastatin (LIPITOR) 40 MG tablet Take 1 tablet (40 mg total) by mouth daily at 6 PM. (Patient taking differently: Take 40 mg by mouth daily. ) 30 tablet 1  . cetirizine (ZYRTEC) 10 MG tablet TAKE 1 TABLETS DAILY. 30 tablet 0  . clopidogrel (PLAVIX) 75 MG tablet Take 75 mg by mouth daily.    . cyclobenzaprine (FLEXERIL) 10 MG tablet Take 1 tablet (10 mg total) by mouth 2 (two) times daily as needed for muscle spasms. 60 tablet 1  . DULERA 200-5 MCG/ACT AERO TAKE 2 PUFFS BY MOUTH TWICE A DAY (Patient taking  differently: Inhale 2 puffs into the lungs 2 (two) times a day. ) 1 Inhaler 1  . DULoxetine (CYMBALTA) 60 MG capsule Take 1 capsule (60 mg total) by mouth daily. 30 capsule 3  . hydrOXYzine (ATARAX/VISTARIL) 25 MG tablet TAKE 1 TABLET BY MOUTH 3 TIMES DAILY AS NEEDED. 60 tablet 0  . isosorbide mononitrate (IMDUR) 60 MG 24 hr tablet Take 60 mg by mouth daily.    . nitroGLYCERIN (NITROSTAT) 0.4 MG SL tablet Place 1 tablet (0.4 mg total) under the tongue every 5 (five) minutes x 3 doses as needed for chest pain. 30 tablet 3  . omeprazole (PRILOSEC) 20 MG capsule TAKE ONE CAPSULE BY MOUTH DAILY. 30 capsule  0  . tamsulosin (FLOMAX) 0.4 MG CAPS capsule Take 1 capsule (0.4 mg total) by mouth daily. Must have office visit for refills 30 capsule 0  . prazosin (MINIPRESS) 1 MG capsule Take 3 capsules (3 mg total) by mouth at bedtime. (Patient not taking: Reported on 12/03/2018) 30 capsule 1   Current Facility-Administered Medications on File Prior to Visit  Medication Dose Route Frequency Provider Last Rate Last Dose  . sodium chloride flush (NS) 0.9 % injection 3 mL  3 mL Intravenous Q12H Jake Bathe, FNP        Observations/Objective: Awake, alert, oriented x3 Anxious  Assessment and Plan: 1. Anxiety and depression Uncontrolled due to underlying caregiver stress He will benefit from psychotherapy and I have referred him to the LCSW Consider addition of Wellbutrin if symptoms persist Caution with increasing Cymbalta dose due to increased risk of adverse drug reactions and uncertain benefits at higher dose - hydrOXYzine (ATARAX/VISTARIL) 25 MG tablet; Take 1 tablet (25 mg total) by mouth 3 (three) times daily as needed.  Dispense: 90 tablet; Refill: 3 - DULoxetine (CYMBALTA) 60 MG capsule; Take 1 capsule (60 mg total) by mouth daily.  Dispense: 60 capsule; Refill: 6  2. DDD (degenerative disc disease), lumbar Uncontrolled I had referred him to pain management but referral declined as patient had been discharged from the pain clinic He has been referred again by his orthopedics and I have asked him to follow-up with them regarding this Gabapentin added to regimen - DULoxetine (CYMBALTA) 60 MG capsule; Take 1 capsule (60 mg total) by mouth daily.  Dispense: 60 capsule; Refill: 6 - cyclobenzaprine (FLEXERIL) 10 MG tablet; Take 1 tablet (10 mg total) by mouth 2 (two) times daily as needed for muscle spasms.  Dispense: 60 tablet; Refill: 6 - gabapentin (NEURONTIN) 300 MG capsule; Take 1 capsule (300 mg total) by mouth 2 (two) times daily.  Dispense: 60 capsule; Refill: 3  3. Caregiver  stress See #1 above  4. Gastroesophageal reflux disease without esophagitis Uncontrolled Increased Prilosec dose Counseled on avoiding late meals and avoiding recumbency up to 2 hours post meals - omeprazole (PRILOSEC) 40 MG capsule; Take 1 capsule (40 mg total) by mouth daily.  Dispense: 30 capsule; Refill: 6  5. Panlobular emphysema (HCC) Stable Advised that smoking cessation will be beneficial - DULERA 200-5 MCG/ACT AERO; TAKE 2 PUFFS BY MOUTH TWICE A DAY  Dispense: 13 g; Refill: 6 - albuterol (VENTOLIN HFA) 108 (90 Base) MCG/ACT inhaler; Inhale 2 puffs into the lungs every 6 (six) hours as needed for wheezing or shortness of breath.  Dispense: 18 g; Refill: 6   Follow Up Instructions: 1 month   I discussed the assessment and treatment plan with the patient. The patient was provided an opportunity to ask questions and all were  answered. The patient agreed with the plan and demonstrated an understanding of the instructions.   The patient was advised to call back or seek an in-person evaluation if the symptoms worsen or if the condition fails to improve as anticipated.     I provided 25 minutes total of non-face-to-face time during this encounter including median intraservice time, reviewing previous notes, labs, imaging, medications, management and patient verbalized understanding.     Charlott Rakes, MD, FAAFP. [facility] and [place], [place]   05/28/2019, 4:32 PM

## 2019-05-28 NOTE — Progress Notes (Signed)
Patient has been called and DOB has been verified. Patient has been screened and transferred to PCP to start phone visit.  Patient would like an increase in hyroxyzine and cymbalta.  Patient is having back and knee pain.

## 2019-05-29 ENCOUNTER — Encounter: Payer: Self-pay | Admitting: Family Medicine

## 2019-06-03 ENCOUNTER — Ambulatory Visit
Payer: No Typology Code available for payment source | Attending: Family Medicine | Admitting: Licensed Clinical Social Worker

## 2019-06-03 ENCOUNTER — Other Ambulatory Visit: Payer: Self-pay

## 2019-06-03 DIAGNOSIS — F419 Anxiety disorder, unspecified: Secondary | ICD-10-CM | POA: Diagnosis not present

## 2019-06-03 DIAGNOSIS — Z636 Dependent relative needing care at home: Secondary | ICD-10-CM

## 2019-06-03 NOTE — BH Specialist Note (Signed)
Integrated Behavioral Health Initial Visit  MRN: [id number] Name: Keith Gibbs  Number of [place] Clinician visits:: 1/6 Session Start time: 11:30 AM  Session End time: 12:00 PM Total time: 30  Type of Service: [place] Interpretor:No. Interpretor Name and Language: NA   SUBJECTIVE: Keith Gibbs is a 58 y.o. male accompanied by self Patient was referred by Dr. Margarita Rana for Caregiver Stress. Patient reports the following symptoms/concerns: Pt reports diagnosis of Depression, Anxiety, and PTSD. Symptoms include difficulty sleeping, inability to relax, decreased appetite, and racing thoughts. His spouse is battling cancer and pt receives very limited support regarding her care. Pt reports chronic pain in his knees and back; however, has delayed surgery to continue providing care to spouse. Family are experienced financial strain.  Duration of problem: Ongoing; Severity of problem: severe  OBJECTIVE: Mood: Anxious and Affect: Depressed Risk of harm to self or others: No plan to harm self or others  LIFE CONTEXT: Family and Social: Pt reports that he receives limited support from his mother due to distance and his wife's elderly cousin School/Work: Pt receives disability and has medicaid. Wife receives SSI and FS Self-Care: Pt smokes cigarettes "constantly" to cope with stressors Life Changes: Pt is experiencing difficulty managing ongoing physical and mental health conditions triggered by psychosocial stressors and caregiver stress  GOALS ADDRESSED: Patient will: 1. Reduce symptoms of: anxiety, depression and stress 2. Increase knowledge and/or ability of: coping skills and healthy habits  3. Demonstrate ability to: Increase healthy adjustment to current life circumstances and Increase adequate support systems for patient/family  INTERVENTIONS: Interventions utilized: Solution-Focused Strategies, Supportive Counseling and  Psychoeducation and/or Health Education  Standardized Assessments completed: GAD-7 and PHQ 2&9  ASSESSMENT: Patient reports diagnosis of Depression, Anxiety, and PTSD. Symptoms include difficulty sleeping, inability to relax, decreased appetite, and racing thoughts. He is experiencing difficulty managing ongoing physical and mental health conditions triggered by psychosocial stressors and caregiver stress.   Patient may benefit from psychotherapy and medication management. LCSW provided validation and support. He is participating in medication management through PCP. Self-care strategies discussed to further assist with management of stressors. LCSW assisted pt in scheduling appt with PCP to address medication concerns. Family has a scheduled appointment with [facility] for pt's spouse 06/04/19 to discuss additional resources to strengthen support. LCSW encouraged pt to contact spouse's PCP to assess for eligibility for PCS services in home to provide pt with support. Referral to pain clinic will be followed up  PLAN: 1. Follow up with behavioral health clinician on : Contact LCSW with any behavioral health or resource needs 2. Behavioral recommendations: Continue medication management, utilize strategies discussed, and follow up with upcoming appt with PCP 3. Referral(s): [place] (In Clinic) 4. "From scale of 1-10, how likely are you to follow plan?":   Rebekah [place], LCSW 06/07/2019 12:11 PM

## 2019-06-05 ENCOUNTER — Other Ambulatory Visit: Payer: Self-pay | Admitting: Family Medicine

## 2019-06-17 ENCOUNTER — Ambulatory Visit: Payer: Medicaid Other | Admitting: Family Medicine

## 2019-07-01 ENCOUNTER — Ambulatory Visit: Payer: Medicaid Other | Admitting: Family Medicine

## 2019-07-08 ENCOUNTER — Ambulatory Visit (INDEPENDENT_AMBULATORY_CARE_PROVIDER_SITE_OTHER): Payer: Medicaid Other | Admitting: Family Medicine

## 2019-07-08 ENCOUNTER — Encounter: Payer: Self-pay | Admitting: Family Medicine

## 2019-07-08 ENCOUNTER — Other Ambulatory Visit: Payer: Self-pay

## 2019-07-08 VITALS — BP 121/76 | HR 58 | Temp 98.1°F | Ht 75.0 in | Wt 219.2 lb

## 2019-07-08 DIAGNOSIS — M5136 Other intervertebral disc degeneration, lumbar region: Secondary | ICD-10-CM | POA: Diagnosis not present

## 2019-07-08 DIAGNOSIS — G894 Chronic pain syndrome: Secondary | ICD-10-CM

## 2019-07-08 DIAGNOSIS — K7469 Other cirrhosis of liver: Secondary | ICD-10-CM

## 2019-07-08 DIAGNOSIS — I25118 Atherosclerotic heart disease of native coronary artery with other forms of angina pectoris: Secondary | ICD-10-CM

## 2019-07-08 DIAGNOSIS — J431 Panlobular emphysema: Secondary | ICD-10-CM

## 2019-07-08 DIAGNOSIS — K219 Gastro-esophageal reflux disease without esophagitis: Secondary | ICD-10-CM

## 2019-07-08 DIAGNOSIS — N50819 Testicular pain, unspecified: Secondary | ICD-10-CM

## 2019-07-08 DIAGNOSIS — F319 Bipolar disorder, unspecified: Secondary | ICD-10-CM

## 2019-07-08 NOTE — Progress Notes (Signed)
New Patient Office Visit  Subjective:  Patient ID: Keith Gibbs, male    DOB: Mar 13, 1961  Age: 58 y.o. MRN: [id number]  CC:  Chief Complaint  Patient presents with  . Establish Care  . Knee Pain    pain clinic referral    HPI Keith Gibbs presents for right knee pain-post surgery-pt states he needs surgery again for knee replacement-Dr. Menz-[place]-cant have surgery until wife had passed way-pt the care giver for wife. Worsening pain.  gabapentin   Pt with back pain and h/o surgery-lumbar surgery-recommended repeat surgery on the back-no recent evaluation-lived in [place] in the past-CT completed.   COPD-pt quit but back smoking-1 pk/day-2 months ago-dulera and albuterol BID-no admission with exacerbation recently  Bipolar d/o/anxiety/depression-no recent evaluation  No longer taking drugs or drinking alcohol-5 years ago pt stopped-loss of license related to alcohol abuse  Past Medical History:  Diagnosis Date  . Anxiety   . Arthritis   . Bursitis   . COPD (chronic obstructive pulmonary disease) ([place])   . Depression   . GERD (gastroesophageal reflux disease)   . Hepatitis C 2012   No longer has Hep C  . Hypertension   . Lung disease   . MI (myocardial infarction) ([place])   . Stroke [facility])     Past Surgical History:  Procedure Laterality Date  . ABDOMINAL SURGERY     removed small piece of intestines due to [facility] Diverticulosis  . APPENDECTOMY    . BACK SURGERY    . CARDIAC CATHETERIZATION Left 06/10/2016   Procedure: Left Heart Cath and Coronary Angiography;  Surgeon: Dionisio David, MD;  Location: [facility] CV LAB;  Service: Cardiovascular;  Laterality: Left;  . CARDIAC CATHETERIZATION N/A 06/10/2016   Procedure: Coronary Stent Intervention;  Surgeon: Yolonda Kida, MD;  Location: [facility];  Service: Cardiovascular;  Laterality: N/A;  . COLONOSCOPY    . COLONOSCOPY WITH PROPOFOL N/A 01/05/2017   Procedure: COLONOSCOPY WITH PROPOFOL;   Surgeon: Jonathon Bellows, MD;  Location: [facility] ENDOSCOPY;  Service: Endoscopy;  Laterality: N/A;  . CORONARY ANGIOPLASTY WITH STENT PLACEMENT    . ESOPHAGOGASTRODUODENOSCOPY (EGD) WITH PROPOFOL N/A 01/05/2017   Procedure: ESOPHAGOGASTRODUODENOSCOPY (EGD) WITH PROPOFOL;  Surgeon: Jonathon Bellows, MD;  Location: [facility] ENDOSCOPY;  Service: Endoscopy;  Laterality: N/A;  . KNEE ARTHROSCOPY WITH MEDIAL MENISECTOMY Right 09/05/2017   Procedure: KNEE ARTHROSCOPY WITH MEDIAL AND LATERAL  MENISECTOMY PARTIAL SYNOVECTOMY;  Surgeon: Hessie Knows, MD;  Location: [facility] ORS;  Service: Orthopedics;  Laterality: Right;  . SHOULDER SURGERY Right 04/09/2012  . SPINE SURGERY      Family History  Problem Relation Age of Onset  . Osteoarthritis Mother   . Heart disease Mother   . Hypertension Mother   . Heart disease Father   . Early death Father   . Hypertension Father   . Heart attack Father   . Hypertension Sister   . Prostate cancer Neg Hx   . Bladder Cancer Neg Hx   . Kidney cancer Neg Hx     Social History   Socioeconomic History  . Marital status: Single    Spouse name: Not on file  . Number of children: Not on file  . Years of education: Not on file  . Highest education level: Not on file  Occupational History  . Occupation: disability  . Occupation: stocking at gas station    Comment: 2 days per week  Tobacco Use  . Smoking status: Former Smoker  Packs/day: 0.50    Years: 32.00    Pack years: 16.00    Types: Cigarettes    Quit date: 03/02/2018    Years since quitting: 1.3  . Smokeless tobacco: Never Used  Substance and Sexual Activity  . Alcohol use: Yes    Alcohol/week: 6.0 standard drinks    Types: 6 Cans of beer per week    Comment: occassionally   . Drug use: Not Currently    Types: Cocaine, Marijuana    Comment: last use couple months ago  . Sexual activity: Yes    Partners: Female    Birth control/protection: None  Other Topics Concern  . Not on file  Social History Narrative   . Not on file   Social Determinants of Health   Financial Resource Strain:   . Difficulty of Paying Living Expenses: Not on file  Food Insecurity:   . Worried About Charity fundraiser in the Last Year: Not on file  . Ran Out of Food in the Last Year: Not on file  Transportation Needs:   . Lack of Transportation (Medical): Not on file  . Lack of Transportation (Non-Medical): Not on file  Physical Activity:   . Days of Exercise per Week: Not on file  . Minutes of Exercise per Session: Not on file  Stress:   . Feeling of Stress : Not on file  Social Connections:   . Frequency of Communication with Friends and Family: Not on file  . Frequency of Social Gatherings with Friends and Family: Not on file  . Attends Religious Services: Not on file  . Active Member of Clubs or Organizations: Not on file  . Attends Archivist Meetings: Not on file  . Marital Status: Not on file  Intimate Partner Violence:   . Fear of Current or Ex-Partner: Not on file  . Emotionally Abused: Not on file  . Physically Abused: Not on file  . Sexually Abused: Not on file    ROS Review of Systems  Constitutional: Positive for activity change, appetite change, diaphoresis and unexpected weight change.  HENT: Positive for dental problem, ear pain, hearing loss, nosebleeds, sinus pressure and sore throat.   Eyes: Positive for redness, itching and visual disturbance.  Respiratory: Positive for apnea, cough, chest tightness, shortness of breath and wheezing.   Cardiovascular: Positive for chest pain and palpitations.  Gastrointestinal: Positive for constipation and nausea.  Endocrine: Positive for cold intolerance, polydipsia and polyuria.  Genitourinary: Positive for difficulty urinating.       Decrease urination  Musculoskeletal: Positive for arthralgias, back pain, myalgias, neck pain and neck stiffness.  Skin: Negative.   Neurological: Positive for dizziness, tremors, weakness,  light-headedness, numbness and headaches.  Hematological: Bruises/bleeds easily.  Psychiatric/Behavioral: Positive for agitation, behavioral problems, confusion, decreased concentration and dysphoric mood. Negative for sleep disturbance. The patient is nervous/anxious and is hyperactive.     Objective:   Today's Vitals: BP 121/76 (BP Location: Left Arm, Patient Position: Sitting, Cuff Size: Normal)   Pulse (!) 58   Temp 98.1 F (36.7 C) (Oral)   Ht 6\' 3"  (1.905 m)   Wt 219 lb 3.2 oz (99.4 kg)   SpO2 97%   BMI 27.40 kg/m   Physical Exam Constitutional:      Appearance: Normal appearance.  Cardiovascular:     Rate and Rhythm: Normal rate and regular rhythm.     Pulses: Normal pulses.     Heart sounds: Normal heart sounds.  Pulmonary:  Effort: Pulmonary effort is normal.     Breath sounds: Normal breath sounds.  Musculoskeletal:     Cervical back: Normal range of motion and neck supple.  Neurological:     Mental Status: He is alert and oriented to person, place, and time.  Psychiatric:        Mood and Affect: Mood normal.        Behavior: Behavior normal.     Assessment & Plan:   1. DDD (degenerative disc disease), lumbar Chronic concerns-surgery in the past - Ambulatory referral to Pain Clinic  2. Chronic pain syndrome D/w pt referral-referral - Ambulatory referral to Pain Clinic  3. Bipolar 1 disorder ([place]) Mental health diagnosis in the past-no medications currently - Ambulatory referral to Psychiatry 4. Coronary artery disease of native artery of native heart with stable angina pectoris ([place]) No recent evaluation-intermittent CP noted - Ambulatory referral to Cardiology - Lipid panel - COMPLETE METABOLIC PANEL WITH GFR - CBC - TSH  5. Panlobular emphysema (HCC) Dulera/albuterol-stable 6. Other cirrhosis of liver ([place]) - Ambulatory referral to Cardiology - Lipid panel - COMPLETE METABOLIC PANEL WITH GFR - CBC - TSH 7. Gastroesophageal reflux  disease without esophagitis - CBC  8. Testicular discomfort - Ambulatory referral to Urology  Outpatient Encounter Medications as of 07/08/2019  Medication Sig  . Multiple Vitamins-Minerals (CENTRUM MEN PO) Take by mouth daily.  Marland Kitchen acetaminophen (TYLENOL) 650 MG CR tablet Take 1,300 mg by mouth every 8 (eight) hours.   Marland Kitchen albuterol (VENTOLIN HFA) 108 (90 Base) MCG/ACT inhaler Inhale 2 puffs into the lungs every 6 (six) hours as needed for wheezing or shortness of breath.  Marland Kitchen aspirin EC 81 MG tablet Take 81 mg by mouth daily.  Marland Kitchen atorvastatin (LIPITOR) 40 MG tablet Take 1 tablet (40 mg total) by mouth daily at 6 PM. (Patient taking differently: Take 40 mg by mouth daily. )  . cetirizine (ZYRTEC) 10 MG tablet TAKE 1 TABLETS DAILY.  Marland Kitchen clopidogrel (PLAVIX) 75 MG tablet Take 75 mg by mouth daily.  . cyclobenzaprine (FLEXERIL) 10 MG tablet Take 1 tablet (10 mg total) by mouth 2 (two) times daily as needed for muscle spasms.  . DULERA 200-5 MCG/ACT AERO TAKE 2 PUFFS BY MOUTH TWICE A DAY  . DULoxetine (CYMBALTA) 60 MG capsule Take 1 capsule (60 mg total) by mouth daily.  Marland Kitchen gabapentin (NEURONTIN) 300 MG capsule Take 1 capsule (300 mg total) by mouth 2 (two) times daily.  . hydrOXYzine (ATARAX/VISTARIL) 25 MG tablet Take 1 tablet (25 mg total) by mouth 3 (three) times daily as needed.  . isosorbide mononitrate (IMDUR) 60 MG 24 hr tablet Take 60 mg by mouth daily.  . nitroGLYCERIN (NITROSTAT) 0.4 MG SL tablet Place 1 tablet (0.4 mg total) under the tongue every 5 (five) minutes x 3 doses as needed for chest pain.  Marland Kitchen omeprazole (PRILOSEC) 40 MG capsule Take 1 capsule (40 mg total) by mouth daily.  . prazosin (MINIPRESS) 1 MG capsule Take 3 capsules (3 mg total) by mouth at bedtime. (Patient not taking: Reported on 12/03/2018)  . tamsulosin (FLOMAX) 0.4 MG CAPS capsule Take 1 capsule (0.4 mg total) by mouth daily. Must have office visit for refills   Facility-Administered Encounter Medications as of  07/08/2019  Medication  . sodium chloride flush (NS) 0.9 % injection 3 mL    Follow-up: No follow-ups on file.   Tavion Senkbeil Hannah Beat, MD

## 2019-07-08 NOTE — Patient Instructions (Addendum)
Fasting blood work Urology referral Pain management referral psy referral cardiology referral

## 2019-07-12 DIAGNOSIS — N50819 Testicular pain, unspecified: Secondary | ICD-10-CM | POA: Insufficient documentation

## 2019-07-29 NOTE — Progress Notes (Deleted)
CARDIOLOGY CONSULT NOTE       Patient ID: Keith Gibbs MRN: [id number] DOB/AGE: [birth year] 59 y.o.  Admit date: (Not on file) Referring Physician: [place] Primary Physician: Maryruth Hancock, MD Primary Cardiologist: New Reason for Consultation: CAD  Active Problems:   * No active hospital problems. *   HPI:  59 y.o. smoker with COPD. Poor medical f/u Seen by Dr Holly Bodily to establish care 07/08/19 Previous history of ETOH and drug abuse. Chronic back pain limits activity previous lumbar surgery . Also with right knee meniscal tear has had cortisone shot. Smoking PPD uses dulera and albuterol inhalers  Referred to psychiatry for diagnosis of bipolar dx and uriology for testicular pain. Seen by Dr Humphrey Rolls and Marcelline Deist in [facility] November 2017 with abnormal stress test and cardiac CTA. 06/10/16 had DES to mid LAD. No significant disease in RCA or circumflex 2.5 x 18 mm Xience  EF normal by echo 65% 03/05/18    Wife has stage 4 breast cancer Causes significant stress   ***  ROS All other systems reviewed and negative except as noted above  Past Medical History:  Diagnosis Date  . Anxiety   . Arthritis   . Bursitis   . COPD (chronic obstructive pulmonary disease) ([place])   . Depression   . GERD (gastroesophageal reflux disease)   . Hepatitis C 2012   No longer has Hep C  . Hypertension   . Lung disease   . MI (myocardial infarction) ([place])   . Stroke [facility])     Family History  Problem Relation Age of Onset  . Osteoarthritis Mother   . Heart disease Mother   . Hypertension Mother   . Heart disease Father   . Early death Father   . Hypertension Father   . Heart attack Father   . Hypertension Sister   . Prostate cancer Neg Hx   . Bladder Cancer Neg Hx   . Kidney cancer Neg Hx     Social History   Socioeconomic History  . Marital status: Single    Spouse name: Not on file  . Number of children: Not on file  . Years of education: Not on file  . Highest education level: Not  on file  Occupational History  . Occupation: disability  . Occupation: stocking at gas station    Comment: 2 days per week  Tobacco Use  . Smoking status: Former Smoker    Packs/day: 0.50    Years: 32.00    Pack years: 16.00    Types: Cigarettes    Quit date: 03/02/2018    Years since quitting: 1.4  . Smokeless tobacco: Never Used  Substance and Sexual Activity  . Alcohol use: Yes    Alcohol/week: 6.0 standard drinks    Types: 6 Cans of beer per week    Comment: occassionally   . Drug use: Not Currently    Types: Cocaine, Marijuana    Comment: last use couple months ago  . Sexual activity: Yes    Partners: Female    Birth control/protection: None  Other Topics Concern  . Not on file  Social History Narrative  . Not on file   Social Determinants of Health   Financial Resource Strain:   . Difficulty of Paying Living Expenses: Not on file  Food Insecurity:   . Worried About Charity fundraiser in the Last Year: Not on file  . Ran Out of Food in the Last Year: Not on file  Transportation Needs:   .  Lack of Transportation (Medical): Not on file  . Lack of Transportation (Non-Medical): Not on file  Physical Activity:   . Days of Exercise per Week: Not on file  . Minutes of Exercise per Session: Not on file  Stress:   . Feeling of Stress : Not on file  Social Connections:   . Frequency of Communication with Friends and Family: Not on file  . Frequency of Social Gatherings with Friends and Family: Not on file  . Attends Religious Services: Not on file  . Active Member of Clubs or Organizations: Not on file  . Attends Archivist Meetings: Not on file  . Marital Status: Not on file  Intimate Partner Violence:   . Fear of Current or Ex-Partner: Not on file  . Emotionally Abused: Not on file  . Physically Abused: Not on file  . Sexually Abused: Not on file    Past Surgical History:  Procedure Laterality Date  . ABDOMINAL SURGERY     removed small piece of  intestines due to [facility] Diverticulosis  . APPENDECTOMY    . BACK SURGERY    . CARDIAC CATHETERIZATION Left 06/10/2016   Procedure: Left Heart Cath and Coronary Angiography;  Surgeon: Dionisio David, MD;  Location: [place] CV LAB;  Service: Cardiovascular;  Laterality: Left;  . CARDIAC CATHETERIZATION N/A 06/10/2016   Procedure: Coronary Stent Intervention;  Surgeon: Yolonda Kida, MD;  Location: [place] CV LAB;  Service: Cardiovascular;  Laterality: N/A;  . COLONOSCOPY    . COLONOSCOPY WITH PROPOFOL N/A 01/05/2017   Procedure: COLONOSCOPY WITH PROPOFOL;  Surgeon: Jonathon Bellows, MD;  Location: [facility] ENDOSCOPY;  Service: Endoscopy;  Laterality: N/A;  . CORONARY ANGIOPLASTY WITH STENT PLACEMENT    . ESOPHAGOGASTRODUODENOSCOPY (EGD) WITH PROPOFOL N/A 01/05/2017   Procedure: ESOPHAGOGASTRODUODENOSCOPY (EGD) WITH PROPOFOL;  Surgeon: Jonathon Bellows, MD;  Location: [facility] ENDOSCOPY;  Service: Endoscopy;  Laterality: N/A;  . KNEE ARTHROSCOPY WITH MEDIAL MENISECTOMY Right 09/05/2017   Procedure: KNEE ARTHROSCOPY WITH MEDIAL AND LATERAL  MENISECTOMY PARTIAL SYNOVECTOMY;  Surgeon: Hessie Knows, MD;  Location: [facility] ORS;  Service: Orthopedics;  Laterality: Right;  . SHOULDER SURGERY Right 04/09/2012  . SPINE SURGERY          Physical Exam: There were no vitals taken for this visit.   Affect appropriate Healthy:  appears stated age 59: normal Neck supple with no adenopathy JVP normal no bruits no thyromegaly Lungs clear with no wheezing and good diaphragmatic motion Heart:  S1/S2 no murmur, no rub, gallop or click PMI normal Abdomen: benighn, BS positve, no tenderness, no AAA no bruit.  No HSM or HJR Distal pulses intact with no bruits No edema Neuro non-focal Skin warm and dry No muscular weakness   Labs:   Lab Results  Component Value Date   WBC 7.4 04/02/2018   HGB 14.7 04/02/2018   HCT 41.9 04/02/2018   MCV 98 (H) 04/02/2018   PLT 186 04/02/2018   No results for  input(s): NA, K, CL, CO2, BUN, CREATININE, CALCIUM, PROT, BILITOT, ALKPHOS, ALT, AST, GLUCOSE in the last 168 hours.  Invalid input(s): LABALBU Lab Results  Component Value Date   TROPONINI <0.03 02/20/2017    Lab Results  Component Value Date   CHOL 100 04/02/2018   CHOL 115 05/11/2017   CHOL 81 12/28/2016   Lab Results  Component Value Date   HDL 42 04/02/2018   HDL 32 (A) 05/11/2017   HDL 47 12/28/2016   Lab Results  Component Value  Date   LDLCALC 39 04/02/2018   LDLCALC 67 05/11/2017   LDLCALC 22 12/28/2016   Lab Results  Component Value Date   TRIG 96 04/02/2018   TRIG 79 05/11/2017   TRIG 58 12/28/2016   Lab Results  Component Value Date   CHOLHDL 2.4 04/02/2018   CHOLHDL 1.7 12/28/2016   CHOLHDL 2.6 06/11/2016   No results found for: LDLDIRECT    Radiology: No results found.  EKG: NSR rate 67 normal    ASSESSMENT AND PLAN:   1. CAD: stent to mid LAD 05/2016 Infrequent atypical chest pain. SL nitro called in [place] myovue ordered  2. Smoking :  COPD no active wheezing continue delura and albuterol f/u with primary consider lung cancer screening CT 3. HLD:  Pending f/u labs with primary continue statin  4. Anxiety/Depression:  Continue cymbalta wife passed from stage 4 breast cancer recently referred to behavioral health  5. Back Pain:  With previous laminectomy on flexeril and neurontin  6. Ortho: right knee meniscal tear cortisone injection f/u ortho    Lexiscan myovue F/U with cardiology in a year if low risk   Signed: Jenkins Rouge 07/29/2019, 12:49 PM

## 2019-08-02 ENCOUNTER — Encounter (HOSPITAL_COMMUNITY): Payer: Self-pay

## 2019-08-02 ENCOUNTER — Other Ambulatory Visit: Payer: Self-pay

## 2019-08-02 ENCOUNTER — Emergency Department (HOSPITAL_COMMUNITY)
Admission: EM | Admit: 2019-08-02 | Discharge: 2019-08-03 | Discharge status: Against Medical Advice | Payer: Medicaid Other | Attending: Emergency Medicine | Admitting: Emergency Medicine

## 2019-08-02 ENCOUNTER — Emergency Department (HOSPITAL_COMMUNITY): Payer: Medicaid Other

## 2019-08-02 ENCOUNTER — Ambulatory Visit: Payer: Medicaid Other | Admitting: Cardiovascular Disease

## 2019-08-02 DIAGNOSIS — R112 Nausea with vomiting, unspecified: Secondary | ICD-10-CM | POA: Diagnosis not present

## 2019-08-02 DIAGNOSIS — I252 Old myocardial infarction: Secondary | ICD-10-CM | POA: Diagnosis not present

## 2019-08-02 DIAGNOSIS — R1013 Epigastric pain: Secondary | ICD-10-CM | POA: Diagnosis present

## 2019-08-02 DIAGNOSIS — Z8673 Personal history of transient ischemic attack (TIA), and cerebral infarction without residual deficits: Secondary | ICD-10-CM | POA: Insufficient documentation

## 2019-08-02 DIAGNOSIS — J449 Chronic obstructive pulmonary disease, unspecified: Secondary | ICD-10-CM | POA: Diagnosis not present

## 2019-08-02 DIAGNOSIS — Z20822 Contact with and (suspected) exposure to covid-19: Secondary | ICD-10-CM | POA: Insufficient documentation

## 2019-08-02 DIAGNOSIS — Z79899 Other long term (current) drug therapy: Secondary | ICD-10-CM | POA: Diagnosis not present

## 2019-08-02 DIAGNOSIS — F17210 Nicotine dependence, cigarettes, uncomplicated: Secondary | ICD-10-CM | POA: Diagnosis not present

## 2019-08-02 DIAGNOSIS — F1193 Opioid use, unspecified with withdrawal: Secondary | ICD-10-CM

## 2019-08-02 DIAGNOSIS — Z7901 Long term (current) use of anticoagulants: Secondary | ICD-10-CM | POA: Diagnosis not present

## 2019-08-02 DIAGNOSIS — I10 Essential (primary) hypertension: Secondary | ICD-10-CM | POA: Insufficient documentation

## 2019-08-02 DIAGNOSIS — F1123 Opioid dependence with withdrawal: Secondary | ICD-10-CM | POA: Diagnosis not present

## 2019-08-02 DIAGNOSIS — Z7982 Long term (current) use of aspirin: Secondary | ICD-10-CM | POA: Diagnosis not present

## 2019-08-02 HISTORY — DX: Other psychoactive substance abuse, uncomplicated: F19.10

## 2019-08-02 HISTORY — DX: Other chronic pain: G89.29

## 2019-08-02 LAB — CBC
HCT: 43.8 % (ref 39.0–52.0)
Hemoglobin: 15 g/dL (ref 13.0–17.0)
MCH: 31.1 pg (ref 26.0–34.0)
MCHC: 34.2 g/dL (ref 30.0–36.0)
MCV: 90.9 fL (ref 80.0–100.0)
Platelets: 287 10*3/uL (ref 150–400)
RBC: 4.82 MIL/uL (ref 4.22–5.81)
RDW: 12.7 % (ref 11.5–15.5)
WBC: 9.3 10*3/uL (ref 4.0–10.5)
nRBC: 0 % (ref 0.0–0.2)

## 2019-08-02 LAB — RAPID URINE DRUG SCREEN, HOSP PERFORMED
Amphetamines: NOT DETECTED
Barbiturates: NOT DETECTED
Benzodiazepines: NOT DETECTED
Cocaine: POSITIVE — AB
Opiates: NOT DETECTED
Tetrahydrocannabinol: POSITIVE — AB

## 2019-08-02 LAB — COMPREHENSIVE METABOLIC PANEL
ALT: 18 U/L (ref 0–44)
AST: 18 U/L (ref 15–41)
Albumin: 4.2 g/dL (ref 3.5–5.0)
Alkaline Phosphatase: 65 U/L (ref 38–126)
Anion gap: 11 (ref 5–15)
BUN: 10 mg/dL (ref 6–20)
CO2: 20 mmol/L — ABNORMAL LOW (ref 22–32)
Calcium: 9.3 mg/dL (ref 8.9–10.3)
Chloride: 102 mmol/L (ref 98–111)
Creatinine, Ser: 0.89 mg/dL (ref 0.61–1.24)
GFR calc Af Amer: >60 mL/min (ref >60)
GFR calc non Af Amer: >60 mL/min (ref >60)
Glucose, Bld: 139 mg/dL — ABNORMAL HIGH (ref 70–99)
Potassium: 4.1 mmol/L (ref 3.5–5.1)
Sodium: 133 mmol/L — ABNORMAL LOW (ref 135–145)
Total Bilirubin: 0.6 mg/dL (ref 0.3–1.2)
Total Protein: 7.7 g/dL (ref 6.5–8.1)

## 2019-08-02 LAB — LIPASE, BLOOD: Lipase: 46 U/L (ref 11–51)

## 2019-08-02 LAB — ETHANOL: Alcohol, Ethyl (B): <10 mg/dL (ref <10)

## 2019-08-02 LAB — ACETAMINOPHEN LEVEL: Acetaminophen (Tylenol), Serum: <10 ug/mL — ABNORMAL LOW (ref 10–30)

## 2019-08-02 LAB — SALICYLATE LEVEL: Salicylate Lvl: <7 mg/dL — ABNORMAL LOW (ref 7.0–30.0)

## 2019-08-02 LAB — RESPIRATORY PANEL BY RT PCR (FLU A&B, COVID)
Influenza A by PCR: NEGATIVE
Influenza B by PCR: NEGATIVE
SARS Coronavirus 2 by RT PCR: NEGATIVE

## 2019-08-02 MED ORDER — CLONIDINE HCL 0.1 MG PO TABS
0.1000 mg | ORAL_TABLET | Freq: Four times a day (QID) | ORAL | Status: DC
  Administered 2019-08-02 (×3): 0.1 mg via ORAL
  Filled 2019-08-02 (×3): qty 1

## 2019-08-02 MED ORDER — CLONIDINE HCL 0.1 MG PO TABS: 0.1000 mg | ORAL_TABLET | Freq: Two times a day (BID) | ORAL | Status: DC

## 2019-08-02 MED ORDER — ONDANSETRON 4 MG PO TBDP
4.0000 mg | ORAL_TABLET | Freq: Four times a day (QID) | ORAL | Status: DC | PRN
  Administered 2019-08-02 (×2): 4 mg via ORAL
  Filled 2019-08-02 (×2): qty 1

## 2019-08-02 MED ORDER — HYDROXYZINE HCL 25 MG PO TABS
25.0000 mg | ORAL_TABLET | Freq: Four times a day (QID) | ORAL | Status: DC | PRN
  Administered 2019-08-02: 25 mg via ORAL
  Filled 2019-08-02: qty 1

## 2019-08-02 MED ORDER — METHOCARBAMOL 500 MG PO TABS
500.0000 mg | ORAL_TABLET | Freq: Three times a day (TID) | ORAL | Status: DC | PRN
  Administered 2019-08-02: 500 mg via ORAL
  Filled 2019-08-02: qty 1

## 2019-08-02 MED ORDER — LOPERAMIDE HCL 2 MG PO CAPS: 2.0000 mg | ORAL_CAPSULE | ORAL | Status: DC | PRN

## 2019-08-02 MED ORDER — METOCLOPRAMIDE HCL 5 MG/ML IJ SOLN
10.0000 mg | Freq: Once | INTRAMUSCULAR | Status: AC
  Administered 2019-08-02: 10 mg via INTRAVENOUS
  Filled 2019-08-02: qty 2

## 2019-08-02 MED ORDER — ONDANSETRON HCL 4 MG/2ML IJ SOLN
4.0000 mg | Freq: Once | INTRAMUSCULAR | Status: AC
  Administered 2019-08-02: 08:00:00 4 mg via INTRAVENOUS
  Filled 2019-08-02: qty 2

## 2019-08-02 MED ORDER — CLONIDINE HCL 0.1 MG PO TABS: 0.1000 mg | ORAL_TABLET | Freq: Every day | ORAL | Status: DC

## 2019-08-02 MED ORDER — LORAZEPAM 2 MG/ML IJ SOLN
1.0000 mg | Freq: Once | INTRAMUSCULAR | Status: AC
  Administered 2019-08-02: 1 mg via INTRAVENOUS
  Filled 2019-08-02: qty 1

## 2019-08-02 MED ORDER — DICYCLOMINE HCL 10 MG PO CAPS
20.0000 mg | ORAL_CAPSULE | Freq: Four times a day (QID) | ORAL | Status: DC | PRN
  Administered 2019-08-02 (×2): 20 mg via ORAL
  Filled 2019-08-02 (×3): qty 2

## 2019-08-02 NOTE — ED Provider Notes (Signed)
[facility] EMERGENCY DEPARTMENT Provider Note   CSN: [id number] Arrival date & time: 08/02/19  [id number]     History Chief Complaint  Patient presents with  . Abdominal Pain  . Drug Overdose    Keith Gibbs is a 59 y.o. male.  Patient with hx polysubstance abuse, depression, heart disease, c/o epigastric pain and nausea, vomiting, diarrhea, onset last pm. Symptoms acute onset, moderate-severe, persistent, constant, non radiating. Denies known ill contacts or bad food ingestion. No recent new meds or abx use. States yesterday drank 7-8 beers, and also states was kicked out of pain clinic due to cocaine abuse, and no opiate pain medication for 3 days. States early this AM may have taken up to 3 days of his normal meds, but states was not trying to harm self, rather trying to feel better. Denies depression or SI.   The history is provided by the patient.  Abdominal Pain Associated symptoms: diarrhea and vomiting   Associated symptoms: no chest pain, no dysuria, no fever, no shortness of breath and no sore throat   Drug Overdose Associated symptoms include abdominal pain. Pertinent negatives include no chest pain, no headaches and no shortness of breath.       Past Medical History:  Diagnosis Date  . Anxiety   . Arthritis   . Bursitis   . Chronic pain   . COPD (chronic obstructive pulmonary disease) ([place])   . Depression   . GERD (gastroesophageal reflux disease)   . Hepatitis C 2012   No longer has Hep C  . Hypertension   . Lung disease   . MI (myocardial infarction) ([place])   . Stroke ([place])   . Substance abuse [facility])     Patient Active Problem List   Diagnosis Date Noted  . Testicular discomfort 07/12/2019  . Anxiety 12/10/2018  . Unstable angina ([place]) 11/30/2018  . 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 Chronic anticoagulation (Plavix) 04/19/2018  . Pain medication agreement broken [facility]) 04/19/2018  . Cocaine use 04/18/2018  . Cocaine abuse ([place]) (See 04/12/18  UDS) 04/18/2018  . Marijuana abuse (See 04/12/18 UDS) 04/18/2018  . Long term current use of opiate analgesic 04/12/2018  . Cirrhosis of liver ([place]) 04/03/2018  . BPH (benign prostatic hyperplasia) 04/03/2018  . Asymptomatic microscopic hematuria 04/03/2018  . Osteoarthritis of the knee (Left) 11/02/2017  . Lumbar facet syndrome (Bilateral) 11/01/2017  . Spondylosis without myelopathy or radiculopathy, lumbosacral region 11/01/2017  . Osteoarthritis of shoulder (Right) 11/01/2017  . Osteoarthritis of acromioclavicular joint (Right) 11/01/2017  . 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 Osteoarthritis 11/01/2017  . Cervicalgia 11/01/2017  . Cervical facet syndrome 11/01/2017  . DDD (degenerative disc disease), cervical 11/01/2017  . DDD (degenerative disc disease), lumbar 11/01/2017  . 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) 09/18/2017  . Chronic neck pain (Fifth Area of Pain) ([place]) 09/18/2017  . Chronic musculoskeletal pain 09/18/2017  . Chronic knee pain (Primary Area of Pain)  (Bilateral) (R>L) 09/11/2017  . Chronic low back pain (Secondary Area of Pain) (Bilateral) 09/11/2017  . Chronic hip pain [facility] Area of Pain) (Bilateral) (R>L) 09/11/2017  . Chronic shoulder pain (Fourth Area of Pain) (Right) 09/11/2017  . Spondylosis without myelopathy or radiculopathy, cervical region 09/11/2017  . Chronic pain syndrome 09/11/2017  . Opiate use 09/11/2017  . Pharmacologic therapy 09/11/2017  . Disorder of skeletal system 09/11/2017  . Problems influencing health status 09/11/2017  . Overdose of medication,  intentional self-harm, sequela ([place]) 12/28/2016  . Tobacco use disorder 12/28/2016  . Severe recurrent major depression without psychotic features ([place]) 12/27/2016  . Coronary artery disease 12/27/2016  . Alcohol use disorder, moderate, dependence ([place]) 12/27/2016  . Cannabis use disorder, moderate,  dependence ([place]) 12/27/2016  . Chest pain 06/10/2016  . Bipolar 1 disorder ([place]) 05/27/2013  . Abnormal ejaculation 11/30/2012  . Chest pain, unspecified 11/30/2012  . Schizoaffective disorder ([place]) 06/27/2012  . COPD (chronic obstructive pulmonary disease) ([place]) 06/27/2012  . Chronic hepatitis C ([place]) 06/27/2012  . GERD (gastroesophageal reflux disease) 06/27/2012    Past Surgical History:  Procedure Laterality Date  . ABDOMINAL SURGERY     removed small piece of intestines due to [facility] Diverticulosis  . APPENDECTOMY    . BACK SURGERY    . CARDIAC CATHETERIZATION Left 06/10/2016   Procedure: Left Heart Cath and Coronary Angiography;  Surgeon: Dionisio David, MD;  Location: [place] CV LAB;  Service: Cardiovascular;  Laterality: Left;  . CARDIAC CATHETERIZATION N/A 06/10/2016   Procedure: Coronary Stent Intervention;  Surgeon: Yolonda Kida, MD;  Location: [place] CV LAB;  Service: Cardiovascular;  Laterality: N/A;  . COLONOSCOPY    . COLONOSCOPY WITH PROPOFOL N/A 01/05/2017   Procedure: COLONOSCOPY WITH PROPOFOL;  Surgeon: Jonathon Bellows, MD;  Location: [facility] ENDOSCOPY;  Service: Endoscopy;  Laterality: N/A;  . CORONARY ANGIOPLASTY WITH STENT PLACEMENT    . ESOPHAGOGASTRODUODENOSCOPY (EGD) WITH PROPOFOL N/A 01/05/2017   Procedure: ESOPHAGOGASTRODUODENOSCOPY (EGD) WITH PROPOFOL;  Surgeon: Jonathon Bellows, MD;  Location: [facility] ENDOSCOPY;  Service: Endoscopy;  Laterality: N/A;  . KNEE ARTHROSCOPY WITH MEDIAL MENISECTOMY Right 09/05/2017   Procedure: KNEE ARTHROSCOPY WITH MEDIAL AND LATERAL  MENISECTOMY PARTIAL SYNOVECTOMY;  Surgeon: Hessie Knows, MD;  Location: [facility] ORS;  Service: Orthopedics;  Laterality: Right;  . SHOULDER SURGERY Right 04/09/2012  . SPINE SURGERY         Family History  Problem Relation Age of Onset  . Osteoarthritis Mother   . Heart disease Mother   . Hypertension Mother   . Heart disease Father   . Early death Father   . Hypertension Father   .  Heart attack Father   . Hypertension Sister   . Prostate cancer Neg Hx   . Bladder Cancer Neg Hx   . Kidney cancer Neg Hx     Social History   Tobacco Use  . Smoking status: Current Every Day Smoker    Packs/day: 0.50    Years: 32.00    Pack years: 16.00    Types: Cigarettes    Last attempt to quit: 03/02/2018    Years since quitting: 1.4  . Smokeless tobacco: Never Used  Substance Use Topics  . Alcohol use: Yes    Alcohol/week: 6.0 standard drinks    Types: 6 Cans of beer per week    Comment: occassionally   . Drug use: Yes    Types: Cocaine, Marijuana    Comment: lasty night    Home Medications Prior to Admission medications   Medication Sig Start Date End Date Taking? Authorizing Provider  acetaminophen (TYLENOL) 650 MG CR tablet Take 1,300 mg by mouth every 8 (eight) hours.     [provider]  albuterol (VENTOLIN HFA) 108 (90 Base) MCG/ACT inhaler Inhale 2 puffs into the lungs every 6 (six) hours as needed for wheezing or shortness of breath. 05/28/19   Charlott Rakes, MD  aspirin EC 81 MG tablet Take 81 mg by mouth daily.  [provider]  atorvastatin (LIPITOR) 40 MG tablet Take 1 tablet (40 mg total) by mouth daily at 6 PM. Patient taking differently: Take 40 mg by mouth daily.  12/30/16   Pucilowska, Jolanta B, MD  cetirizine (ZYRTEC) 10 MG tablet TAKE 1 TABLETS DAILY. 05/20/19   Charlott Rakes, MD  clopidogrel (PLAVIX) 75 MG tablet Take 75 mg by mouth daily.    [provider]  cyclobenzaprine (FLEXERIL) 10 MG tablet Take 1 tablet (10 mg total) by mouth 2 (two) times daily as needed for muscle spasms. 05/28/19   Charlott Rakes, MD  DULERA 200-5 MCG/ACT AERO TAKE 2 PUFFS BY MOUTH TWICE A DAY 05/28/19   Charlott Rakes, MD  DULoxetine (CYMBALTA) 60 MG capsule Take 1 capsule (60 mg total) by mouth daily. 05/28/19   Charlott Rakes, MD  gabapentin (NEURONTIN) 300 MG capsule Take 1 capsule (300 mg total) by mouth 2 (two) times daily. 05/28/19    Charlott Rakes, MD  hydrOXYzine (ATARAX/VISTARIL) 25 MG tablet Take 1 tablet (25 mg total) by mouth 3 (three) times daily as needed. 05/28/19   Charlott Rakes, MD  isosorbide mononitrate (IMDUR) 60 MG 24 hr tablet Take 60 mg by mouth daily.    [provider]  Multiple Vitamins-Minerals (CENTRUM MEN PO) Take by mouth daily.    [provider]  nitroGLYCERIN (NITROSTAT) 0.4 MG SL tablet Place 1 tablet (0.4 mg total) under the tongue every 5 (five) minutes x 3 doses as needed for chest pain. 12/10/18   Charlott Rakes, MD  omeprazole (PRILOSEC) 40 MG capsule Take 1 capsule (40 mg total) by mouth daily. 05/28/19   Charlott Rakes, MD  prazosin (MINIPRESS) 1 MG capsule Take 3 capsules (3 mg total) by mouth at bedtime. Patient not taking: Reported on 12/03/2018 04/02/18   Virginia Crews, MD  tamsulosin (FLOMAX) 0.4 MG CAPS capsule Take 1 capsule (0.4 mg total) by mouth daily. Must have office visit for refills 05/28/19   Charlott Rakes, MD    Allergies    Anette Guarneri [lurasidone hcl], Naproxen, and Saphris [asenapine]  Review of Systems   Review of Systems  Constitutional: Negative for fever.  HENT: Negative for sore throat.   Eyes: Negative for pain and visual disturbance.  Respiratory: Negative for shortness of breath.   Cardiovascular: Negative for chest pain.  Gastrointestinal: Positive for abdominal pain, diarrhea and vomiting.  Endocrine: Negative for polyuria.  Genitourinary: Negative for dysuria and flank pain.  Musculoskeletal: Negative for neck pain.  Skin: Negative for rash.  Neurological: Negative for headaches.  Hematological: Does not bruise/bleed easily.  Psychiatric/Behavioral: Negative for confusion.    Physical Exam Updated Vital Signs BP (!) 150/122   Pulse 67   Temp 97.8 F (36.6 C) (Oral)   Resp (!) 30   Ht 1.905 m (6\' 3" )   Wt 97.5 kg   SpO2 99%   BMI 26.87 kg/m   Physical Exam Vitals and nursing note reviewed.  Constitutional:       Appearance: Normal appearance. He is well-developed.  HENT:     Head: Atraumatic.     Nose: Nose normal.     Mouth/Throat:     Mouth: Mucous membranes are moist.     Pharynx: Oropharynx is clear.  Eyes:     General: No scleral icterus.    Conjunctiva/sclera: Conjunctivae normal.     Pupils: Pupils are equal, round, and reactive to light.  Neck:     Trachea: No tracheal deviation.  Cardiovascular:  Rate and Rhythm: Normal rate and regular rhythm.     Pulses: Normal pulses.     Heart sounds: Normal heart sounds. No murmur. No friction rub. No gallop.   Pulmonary:     Effort: Pulmonary effort is normal. No accessory muscle usage or respiratory distress.     Breath sounds: Normal breath sounds.  Abdominal:     General: Bowel sounds are normal. There is no distension.     Palpations: Abdomen is soft. There is no mass.     Tenderness: There is no abdominal tenderness. There is no guarding or rebound.     Hernia: No hernia is present.  Genitourinary:    Comments: No cva tenderness. Musculoskeletal:        General: No swelling or tenderness.     Cervical back: Normal range of motion and neck supple. No rigidity.  Skin:    General: Skin is warm and dry.     Findings: No rash.  Neurological:     Mental Status: He is alert.     Comments: Alert, speech clear. Motor/sens grossly intact bil. Steady gait.   Psychiatric:     Comments: Very anxious. No SI/HI.      ED Results / Procedures / Treatments   Labs (all labs ordered are listed, but only abnormal results are displayed) Results for orders placed or performed during the hospital encounter of 08/02/19  Respiratory Panel by RT PCR (Flu A&B, Covid) - Nasopharyngeal Swab   Specimen: Nasopharyngeal Swab  Result Value Ref Range   SARS Coronavirus 2 by RT PCR NEGATIVE NEGATIVE   Influenza A by PCR NEGATIVE NEGATIVE   Influenza B by PCR NEGATIVE NEGATIVE  CBC  Result Value Ref Range   WBC 9.3 4.0 - 10.5 K/uL   RBC 4.82 4.22 -  5.81 MIL/uL   Hemoglobin 15.0 13.0 - 17.0 g/dL   HCT 43.8 39.0 - 52.0 %   MCV 90.9 80.0 - 100.0 fL   MCH 31.1 26.0 - 34.0 pg   MCHC 34.2 30.0 - 36.0 g/dL   RDW 12.7 11.5 - 15.5 %   Platelets 287 150 - 400 K/uL   nRBC 0.0 0.0 - 0.2 %  CMET  Result Value Ref Range   Sodium 133 (L) 135 - 145 mmol/L   Potassium 4.1 3.5 - 5.1 mmol/L   Chloride 102 98 - 111 mmol/L   CO2 20 (L) 22 - 32 mmol/L   Glucose, Bld 139 (H) 70 - 99 mg/dL   BUN 10 6 - 20 mg/dL   Creatinine, Ser 0.89 0.61 - 1.24 mg/dL   Calcium 9.3 8.9 - 10.3 mg/dL   Total Protein 7.7 6.5 - 8.1 g/dL   Albumin 4.2 3.5 - 5.0 g/dL   AST 18 15 - 41 U/L   ALT 18 0 - 44 U/L   Alkaline Phosphatase 65 38 - 126 U/L   Total Bilirubin 0.6 0.3 - 1.2 mg/dL   GFR calc non Af Amer >60 >60 mL/min   GFR calc Af Amer >60 >60 mL/min   Anion gap 11 5 - 15  Lipase  Result Value Ref Range   Lipase 46 11 - 51 U/L  Etoh  Result Value Ref Range   Alcohol, Ethyl (B) <10 <10 mg/dL  Acetaminophen level  Result Value Ref Range   Acetaminophen (Tylenol), Serum <10 (L) 10 - 30 ug/mL  Salicylate level  Result Value Ref Range   Salicylate Lvl Q000111Q (L) 7.0 - 30.0 mg/dL  Urine rapid  drug screen  Result Value Ref Range   Opiates NONE DETECTED NONE DETECTED   Cocaine POSITIVE (A) NONE DETECTED   Benzodiazepines NONE DETECTED NONE DETECTED   Amphetamines NONE DETECTED NONE DETECTED   Tetrahydrocannabinol POSITIVE (A) NONE DETECTED   Barbiturates NONE DETECTED NONE DETECTED    EKG EKG Interpretation  Date/Time:  Friday August 02 2019 07:21:08 EST Ventricular Rate:  72 PR Interval:    QRS Duration: 99 QT Interval:  408 QTC Calculation: 447 R Axis:   60 Text Interpretation: Sinus rhythm No significant change since last tracing Confirmed by Lajean Saver [phone]) on 08/02/2019 7:26:58 AM   Radiology XR ABD Acute Series  Result Date: 08/02/2019 CLINICAL DATA:  Abdominal pain, vomiting EXAM: DG ABDOMEN ACUTE W/ 1V CHEST COMPARISON:  11/23/2018  FINDINGS: There is no evidence of dilated bowel loops or free intraperitoneal air. Mild volume of stool projecting throughout the colon. No radiopaque calculi or other significant radiographic abnormality is seen. Heart size and mediastinal contours are within normal limits. No focal airspace consolidation, pleural effusion, or pneumothorax. Surgical fixation hardware at L4-S1. IMPRESSION: Negative abdominal radiographs.  No acute cardiopulmonary disease. Electronically Signed   By: Davina Poke D.O.   On: 08/02/2019 12:30    Procedures Procedures (including critical care time)  Medications Ordered in ED Medications  ondansetron (ZOFRAN) injection 4 mg (has no administration in time range)  LORazepam (ATIVAN) injection 1 mg (has no administration in time range)    ED Course  I have reviewed the triage vital signs and the nursing notes.  Pertinent labs & imaging results that were available during my care of the patient were reviewed by me and considered in my medical decision making (see chart for details).    MDM Rules/Calculators/A&P                      Iv ns bolus. zofran iv for nausea. Ativan 1 mg iv for anxiety.   Labs sent.   Continuous pulse ox and monitor.  Reviewed nursing notes and prior charts for additional history.   Labs reviewed/interpreted by me - uds +cocaine. Chem largely unremarkable. Sal/acet negative. Lipase normal. UDS + cocaine, THC.   Recheck pt, calmer, alert, no distress.   N/V persist. reglan iv. Likely multifactorial, possibly related to recent cessation opiate use/abuse and resultant withdrawal, possible viral GE, possible THC abuse. As nv persist, will add imaging to workup.   Xrays reviewed/interpreted by me - no sbo.   Recheck abd soft nt.   Disposition per [facility] team.          Final Clinical Impression(s) / ED Diagnoses Final diagnoses:  None    Rx / DC Orders ED Discharge Orders    None       Lajean Saver, MD 08/02/19  [id number]

## 2019-08-02 NOTE — ED Notes (Addendum)
Pt changing into paper scrubs at this time. Pt's belongings secured in lockers-pants, shirt, robe, slip on shoes, socks and 1 silver and yellow colored ring.

## 2019-08-02 NOTE — Progress Notes (Signed)
Patient meets inpatient criteria per, Oren Section, NP. The patient has been faxed out to the following facilities for review:   [facility] Details  [facility] Details [facility] Details [facility] Details [place] Details [facility] Details [facility] Details  [facility] Details [facility] Details [facility] [place] Details [facility] Details  [place] Details  [facility] Details [place] Details [place] Details [place]  Disposition CSW will continue to follow and assist with finding placement.   Domenic Schwab, MSW, LCSW-A Clinical Disposition Social Worker [facility]/TTS [phone]

## 2019-08-02 NOTE — ED Notes (Signed)
Dr Ashok Cordia messaged and informed Pt continues with nausea and has vomited x1. Pt has been given vistaril, zofran, bentyl. Can he have something else for nausea/vomiting?

## 2019-08-02 NOTE — ED Triage Notes (Addendum)
Pt reports drinking at least 7 beers yesterday. Pt reports that he was thrown out of pain clinic due to cocaine and has been buying meds off streets and has not taken any morphine in 3 days. Was taking oxycodone at pain clinic. Pt reports taking all but one row of methyprednisolone 4 mg and took  3 days of normal meds which include duloxetine 60 mg, tamsulosin, isosorbide and plavix. Woke up at one am with severe abdominal pain and feeling dizzy. Pt reports doing cocaine last night

## 2019-08-02 NOTE — ED Notes (Signed)
Patient has sitter at bedside. Patient was asleep at this time. Patient easily arousalable to obtain vitals. Patient very pleasant and cooperative.

## 2019-08-02 NOTE — BH Assessment (Addendum)
Tele Assessment Note   Patient Name: Keith Gibbs MRN: [id number] Referring Physician: Lajean Saver, MD Location of Patient: [facility] Location of Provider: [place] is a 59 y.o. married male who presents voluntarily to [facility] with epigastric pain, vomiting. depression and no opiate pain medication for 3 days. Pt had to suspend assessment once due to ongoing pain and vomiting and 2nd session cut short as well for same. Pt reports his wife is currently in Hospice care. She has been there about 10 days and will likely live no longer than 2 more weeks. Pt states "everything is going wrong". Pt has a history of chronic pain and participation in pain clinic. Pt was recently removed from clinic for cocaine use. Pt denies current suicidal ideation. Past attempts include 3 times. Pt acknowledges multiple symptoms of Depression, including anhedonia, isolating, feelings of worthlessness & guilt, tearfulness, changes in sleep, & increased irritability. Pt denies homicidal ideation. He reports he used to be violent in distant past. Pt denies auditory & visual hallucinations. He reports feelings of paranoia. Pt states current stressors include his wife dying, his current pain and vomiting, financial problems and upcoming loss of his home due to not being able to afford it without his wife.   Prior to wife going to hospice, pt was living with her and their 2 dogs. Pt states he has no other support. Pt denies hx of abuse. Pt has partial insight and judgment. Pt's memory is intact. Legal history includes no current charges.  Protective factors against suicide include no current suicidal ideation & no access to firearms.?  ? MSE: Pt is alert, but distracted by his pain and vomiting, oriented x4 with normal speech and normal motor behavior. Eye contact is fair. Pt's mood is depressed and affect is depressed. Affect is congruent with mood. Thought process is coherent and relevant.  There is no indication Pt is currently responding to internal stimuli or experiencing delusional thought content. Pt attempted to be cooperative throughout assessment.     Diagnosis: MDD, recurrent, severe with psychosis.  Disposition:  Ricky Ala, NP recommends admission to [facility] Observation Unit   Past Medical History:  Past Medical History:  Diagnosis Date  . Anxiety   . Arthritis   . Bursitis   . Chronic pain   . COPD (chronic obstructive pulmonary disease) ([place])   . Depression   . GERD (gastroesophageal reflux disease)   . Hepatitis C 2012   No longer has Hep C  . Hypertension   . Lung disease   . MI (myocardial infarction) ([place])   . Stroke ([place])   . Substance abuse [facility]     Past Surgical History:  Procedure Laterality Date  . ABDOMINAL SURGERY     removed small piece of intestines due to [facility] Diverticulosis  . APPENDECTOMY    . BACK SURGERY    . CARDIAC CATHETERIZATION Left 06/10/2016   Procedure: Left Heart Cath and Coronary Angiography;  Surgeon: Dionisio David, MD;  Location: [place] CV LAB;  Service: Cardiovascular;  Laterality: Left;  . CARDIAC CATHETERIZATION N/A 06/10/2016   Procedure: Coronary Stent Intervention;  Surgeon: Yolonda Kida, MD;  Location: [place] CV LAB;  Service: Cardiovascular;  Laterality: N/A;  . COLONOSCOPY    . COLONOSCOPY WITH PROPOFOL N/A 01/05/2017   Procedure: COLONOSCOPY WITH PROPOFOL;  Surgeon: Jonathon Bellows, MD;  [facility];  Service: Endoscopy;  Laterality: N/A;  . CORONARY ANGIOPLASTY WITH STENT PLACEMENT    .  ESOPHAGOGASTRODUODENOSCOPY (EGD) WITH PROPOFOL N/A 01/05/2017   Procedure: ESOPHAGOGASTRODUODENOSCOPY (EGD) WITH PROPOFOL;  Surgeon: Jonathon Bellows, MD;  Location: [facility] ENDOSCOPY;  Service: Endoscopy;  Laterality: N/A;  . KNEE ARTHROSCOPY WITH MEDIAL MENISECTOMY Right 09/05/2017   Procedure: KNEE ARTHROSCOPY WITH MEDIAL AND LATERAL  MENISECTOMY PARTIAL SYNOVECTOMY;  Surgeon: Hessie Knows, MD;   Location: [facility] ORS;  Service: Orthopedics;  Laterality: Right;  . SHOULDER SURGERY Right 04/09/2012  . SPINE SURGERY      Family History:  Family History  Problem Relation Age of Onset  . Osteoarthritis Mother   . Heart disease Mother   . Hypertension Mother   . Heart disease Father   . Early death Father   . Hypertension Father   . Heart attack Father   . Hypertension Sister   . Prostate cancer Neg Hx   . Bladder Cancer Neg Hx   . Kidney cancer Neg Hx     Social History:  reports that he has been smoking cigarettes. He has a 16.00 pack-year smoking history. He has never used smokeless tobacco. He reports current alcohol use of about 6.0 standard drinks of alcohol per week. He reports current drug use. Drugs: Cocaine and Marijuana.  Additional Social History:  Alcohol / Drug Use Pain Medications: None prescribed Prescriptions: multiple, he thinks 2 of them are psychiatric- can't remember names of them but for depression and anxiety History of alcohol / drug use?: Yes Longest period of sobriety (when/how long): 5 years Substance #1 Name of Substance 1: cocaine 1 - Age of First Use: 24 1 - Frequency: once monthly about 1 - Last Use / Amount: 08/01/19 Substance #2 Name of Substance 2: alcohol 2 - Age of First Use: 16 2 - Amount (size/oz): 6 pack beer 2 - Frequency: monthly 2 - Last Use / Amount: 08/01/19 Substance #3 Name of Substance 3: morphine 3 - Last Use / Amount: 07/31/19  CIWA: CIWA-Ar BP: (!) 142/82 Pulse Rate: 72 COWS:    Allergies:  Allergies  Allergen Reactions  . Latuda [Lurasidone Hcl] Other (See Comments)    tremor   . Naproxen Hives and Swelling  . Saphris [Asenapine] Other (See Comments)    Increased tremors     Home Medications: (Not in a hospital admission)   OB/GYN Status:  No LMP for male patient.  General Assessment Data Location of Assessment: [facility] TTS Assessment: In system Is this a Tele or Face-to-Face Assessment?: Tele  Assessment Is this an Initial Assessment or a Re-assessment for this encounter?: Initial Assessment Patient Accompanied by:: N/A Language Other than English: No Living Arrangements: Other (Comment) What gender do you identify as?: Male Marital status: Married Living Arrangements: Spouse/significant other(wife went to hospice about 10 days ago) Can pt return to current living arrangement?: Yes Admission Status: Voluntary Referral Source: Self/Family/Friend Insurance type: medicaid     Crisis Care Plan Living Arrangements: Spouse/significant other(wife went to hospice about 10 days ago) Name of Psychiatrist: none currently(5 years ago last psychiatrist) Name of Therapist: none currently  Education Status Is patient currently in school?: No Is the patient employed, unemployed or receiving disability?: Receiving disability income  Risk to self with the past 6 months Suicidal Ideation: No Has patient been a risk to self within the past 6 months prior to admission? : No Suicidal Intent: No Has patient had any suicidal intent within the past 6 months prior to admission? : No Is patient at risk for suicide?: Yes Suicidal Plan?: No Has patient had any suicidal plan  within the past 6 months prior to admission? : No What has been your use of drugs/alcohol within the last 12 months?: daily Previous Attempts/Gestures: Yes How many times?: 3 Other Self Harm Risks: older, white, male, wife dying Triggers for Past Attempts: Unknown Intentional Self Injurious Behavior: None Family Suicide History: Yes(p grandmother, m cousin) Recent stressful life event(s): Loss (Comment), Financial Problems("you name it, everything") Persecutory voices/beliefs?: No Depression: Yes Depression Symptoms: Despondent, Insomnia, Tearfulness, Isolating, Fatigue, Guilt, Loss of interest in usual pleasures, Feeling worthless/self pity, Feeling angry/irritable Substance abuse history and/or treatment for substance  abuse?: Yes Suicide prevention information given to non-admitted patients: Not applicable  Risk to Others within the past 6 months Homicidal Ideation: No Does patient have any lifetime risk of violence toward others beyond the six months prior to admission? : No Thoughts of Harm to Others: No Current Homicidal Intent: No Current Homicidal Plan: No History of harm to others?: No Assessment of Violence: None Noted Does patient have access to weapons?: No Criminal Charges Pending?: No Does patient have a court date: No Is patient on probation?: No  Psychosis Hallucinations: None noted Delusions: Persecutory("once in a while" paranoia)  Mental Status Report Appearance/Hygiene: Disheveled Eye Contact: Fair Motor Activity: Restlessness Speech: Unremarkable Level of Consciousness: Restless Mood: Depressed Affect: Sad Anxiety Level: Moderate Thought Processes: Coherent, Relevant Judgement: Partial Orientation: Appropriate for developmental age Obsessive Compulsive Thoughts/Behaviors: None  Cognitive Functioning Concentration: Fair Memory: Recent Intact, Remote Intact Is patient IDD: No Insight: Fair Impulse Control: Fair Sleep: Decreased Total Hours of Sleep: 4 Vegetative Symptoms: Decreased grooming  ADLScreening [facility] Assessment Services) Patient's cognitive ability adequate to safely complete daily activities?: Yes Patient able to express need for assistance with ADLs?: Yes Independently performs ADLs?: Yes (appropriate for developmental age)  Prior Inpatient Therapy Prior Inpatient Therapy: Yes Prior Therapy Dates: UTA Prior Therapy Facilty/Provider(s): UTA Reason for Treatment: UTA  Prior Outpatient Therapy Prior Outpatient Therapy: Yes Prior Therapy Dates: 2015 Prior Therapy Facilty/Provider(s): UTA Reason for Treatment: med mngt Does patient have an ACCT team?: No Does patient have Intensive In-House Services?  : No Does patient have [facility] services? :  No Does patient have P4CC services?: No  ADL Screening (condition at time of admission) Patient's cognitive ability adequate to safely complete daily activities?: Yes Is the patient deaf or have difficulty hearing?: No Does the patient have difficulty seeing, even when wearing glasses/contacts?: No Does the patient have difficulty concentrating, remembering, or making decisions?: No Patient able to express need for assistance with ADLs?: Yes Does the patient have difficulty dressing or bathing?: No Independently performs ADLs?: Yes (appropriate for developmental age) Does the patient have difficulty walking or climbing stairs?: No Weakness of Legs: None Weakness of Arms/Hands: None  Home Assistive Devices/Equipment Home Assistive Devices/Equipment: None  Therapy Consults (therapy consults require a physician order) PT Evaluation Needed: No OT Evalulation Needed: No SLP Evaluation Needed: No Abuse/Neglect Assessment (Assessment to be complete while patient is alone) Abuse/Neglect Assessment Can Be Completed: Yes Physical Abuse: Denies Verbal Abuse: Denies Sexual Abuse: Denies Exploitation of patient/patient's resources: Denies Self-Neglect: Denies Values / Beliefs Cultural Requests During Hospitalization: None Spiritual Requests During Hospitalization: None Consults Spiritual Care Consult Needed: No Transition of Care Team Consult Needed: No Advance Directives (For Healthcare) Does Patient Have a Medical Advance Directive?: No Would patient like information on creating a medical advance directive?: No - Guardian declined          Disposition: Ricky Ala, NP recommends admission to [facility] Observation  Unit Disposition Initial Assessment Completed for this Encounter: Yes  This service was provided via telemedicine using a 2-way, interactive audio and video technology.    Zanya Lindo H Kaydie Petsch 08/02/2019 1:21 PM

## 2019-08-02 NOTE — ED Notes (Signed)
Dr Ashok Cordia notified pt nauseated, zofran and vistaril given. No new orders received at this time.

## 2019-08-02 NOTE — ED Notes (Signed)
Pt given meal tray.

## 2019-08-02 NOTE — ED Notes (Signed)
Poison control notified.  

## 2019-08-02 NOTE — ED Notes (Signed)
Pt vomited approx 200 ml in emesis bag.

## 2019-08-02 NOTE — ED Notes (Signed)
Pt keys removed from psych lockers and given to security to give to pt mother per his request.

## 2019-08-02 NOTE — BH Assessment (Signed)
TTS assessment suspended due to pt's ongoing vomiting and his request to complete later.

## 2019-08-02 NOTE — ED Notes (Signed)
TTS in process 

## 2019-08-03 NOTE — ED Notes (Signed)
Pt woke up to ambulate to restroom. Pt provided refreshment and snack.

## 2019-08-03 NOTE — ED Notes (Signed)
pts states he is ready to go home.  States he does not want to kill himself or harm anyone else.  Spoke with [facility] and advised what pt was saying.  [place] states NP was planning on seeing him as soon as she could, but if pt wants to leave have him sign ama.  Pt signed out ama.

## 2019-08-03 NOTE — ED Notes (Signed)
Patient resting on stretcher with eyes closed. No issues at this time. Patient is behaving approprietly for age.

## 2019-08-03 NOTE — ED Provider Notes (Signed)
Nurse reports patient is wanting to leave. He presented voluntarily with abdominal pain, vomiting, narcotic substance abuse, past history of suicide attempt x3. He reports a lot of depression due to his life circumstance. His wife is in hospice and is not expected to last another 2 weeks. He will lose his house without his wife's income.  He has already been evaluated by TTS and they recommended inpatient admission.  We will have TTS reevaluate to see if he is able to be treated as an outpatient.   Rolland Porter, MD 08/03/19 [phone]

## 2019-08-03 NOTE — ED Notes (Signed)
Patient in hallway sleeping. No issues.

## 2019-08-03 NOTE — BH Assessment (Signed)
19/2021 Reassessment Patient presented orientated x4, mood "I'm ready to get out of here and go home", affect moderately anxious.  Patient denied current SI, HI, AVH.  He reports experiencing depression because Spouse is in hospice and prognosis for recovery is poor.  Patient reports poor sleep quality the previous night due to disruptive sleep from "worrying" about Spouse.  He also reports feeling overwhelmed about everything that is going on to include having to deal with Spouse's estranged adult Daughters who now wants to take charge of her care.  Patient reports having a good appetite this morning and is ready to eat breakfast.  Patient also reports his Parents, that live in [place], are very supportive and have been taking care of his dogs while he is in the hospital.       Patient will receive a psychiatric evaluation by Mordecai Maes, NP.

## 2019-08-03 NOTE — ED Notes (Signed)
Pt expressing desire to leave and does not want to be in [facility] anymore. RN advised MD and MD has requested follow-up TTS to re-evaluate Pts status. Pt currently voluntary and meeting In-Patient Criteria per [facility].

## 2019-08-05 ENCOUNTER — Encounter (HOSPITAL_COMMUNITY): Payer: Self-pay | Admitting: Emergency Medicine

## 2019-08-05 ENCOUNTER — Emergency Department (HOSPITAL_COMMUNITY)
Admission: EM | Admit: 2019-08-05 | Discharge: 2019-08-05 | Disposition: A | Discharge status: Home / Self Care | Payer: Medicaid Other | Attending: Emergency Medicine | Admitting: Emergency Medicine

## 2019-08-05 ENCOUNTER — Other Ambulatory Visit: Payer: Self-pay

## 2019-08-05 ENCOUNTER — Emergency Department (HOSPITAL_COMMUNITY): Payer: Medicaid Other

## 2019-08-05 DIAGNOSIS — Z7982 Long term (current) use of aspirin: Secondary | ICD-10-CM | POA: Insufficient documentation

## 2019-08-05 DIAGNOSIS — F17210 Nicotine dependence, cigarettes, uncomplicated: Secondary | ICD-10-CM | POA: Insufficient documentation

## 2019-08-05 DIAGNOSIS — R112 Nausea with vomiting, unspecified: Secondary | ICD-10-CM

## 2019-08-05 DIAGNOSIS — K219 Gastro-esophageal reflux disease without esophagitis: Secondary | ICD-10-CM | POA: Insufficient documentation

## 2019-08-05 DIAGNOSIS — Z79899 Other long term (current) drug therapy: Secondary | ICD-10-CM | POA: Diagnosis not present

## 2019-08-05 DIAGNOSIS — R1084 Generalized abdominal pain: Secondary | ICD-10-CM | POA: Diagnosis not present

## 2019-08-05 DIAGNOSIS — I119 Hypertensive heart disease without heart failure: Secondary | ICD-10-CM | POA: Insufficient documentation

## 2019-08-05 DIAGNOSIS — I2510 Atherosclerotic heart disease of native coronary artery without angina pectoris: Secondary | ICD-10-CM | POA: Insufficient documentation

## 2019-08-05 DIAGNOSIS — Z7902 Long term (current) use of antithrombotics/antiplatelets: Secondary | ICD-10-CM | POA: Insufficient documentation

## 2019-08-05 DIAGNOSIS — F1193 Opioid use, unspecified with withdrawal: Secondary | ICD-10-CM

## 2019-08-05 DIAGNOSIS — F1113 Opioid abuse with withdrawal: Secondary | ICD-10-CM | POA: Insufficient documentation

## 2019-08-05 DIAGNOSIS — F1123 Opioid dependence with withdrawal: Secondary | ICD-10-CM

## 2019-08-05 LAB — COMPREHENSIVE METABOLIC PANEL
ALT: 22 U/L (ref 0–44)
AST: 15 U/L (ref 15–41)
Albumin: 4.1 g/dL (ref 3.5–5.0)
Alkaline Phosphatase: 60 U/L (ref 38–126)
Anion gap: 8 (ref 5–15)
BUN: 10 mg/dL (ref 6–20)
CO2: 29 mmol/L (ref 22–32)
Calcium: 9.3 mg/dL (ref 8.9–10.3)
Chloride: 100 mmol/L (ref 98–111)
Creatinine, Ser: 0.97 mg/dL (ref 0.61–1.24)
GFR calc Af Amer: >60 mL/min (ref >60)
GFR calc non Af Amer: >60 mL/min (ref >60)
Glucose, Bld: 109 mg/dL — ABNORMAL HIGH (ref 70–99)
Potassium: 3.7 mmol/L (ref 3.5–5.1)
Sodium: 137 mmol/L (ref 135–145)
Total Bilirubin: 0.7 mg/dL (ref 0.3–1.2)
Total Protein: 6.9 g/dL (ref 6.5–8.1)

## 2019-08-05 LAB — URINALYSIS, ROUTINE W REFLEX MICROSCOPIC
Bilirubin Urine: NEGATIVE
Glucose, UA: NEGATIVE mg/dL
Hgb urine dipstick: NEGATIVE
Ketones, ur: NEGATIVE mg/dL
Leukocytes,Ua: NEGATIVE
Nitrite: NEGATIVE
Protein, ur: NEGATIVE mg/dL
Specific Gravity, Urine: 1.005 (ref 1.005–1.030)
pH: 7 (ref 5.0–8.0)

## 2019-08-05 LAB — CBC WITH DIFFERENTIAL/PLATELET
Abs Immature Granulocytes: 0.03 10*3/uL (ref 0.00–0.07)
Basophils Absolute: 0 10*3/uL (ref 0.0–0.1)
Basophils Relative: 0 %
Eosinophils Absolute: 0.3 10*3/uL (ref 0.0–0.5)
Eosinophils Relative: 4 %
HCT: 48 % (ref 39.0–52.0)
Hemoglobin: 15.8 g/dL (ref 13.0–17.0)
Immature Granulocytes: 0 %
Lymphocytes Relative: 22 %
Lymphs Abs: 2.1 10*3/uL (ref 0.7–4.0)
MCH: 30.7 pg (ref 26.0–34.0)
MCHC: 32.9 g/dL (ref 30.0–36.0)
MCV: 93.2 fL (ref 80.0–100.0)
Monocytes Absolute: 0.6 10*3/uL (ref 0.1–1.0)
Monocytes Relative: 6 %
Neutro Abs: 6.3 10*3/uL (ref 1.7–7.7)
Neutrophils Relative %: 68 %
Platelets: 256 10*3/uL (ref 150–400)
RBC: 5.15 MIL/uL (ref 4.22–5.81)
RDW: 12.7 % (ref 11.5–15.5)
WBC: 9.4 10*3/uL (ref 4.0–10.5)
nRBC: 0 % (ref 0.0–0.2)

## 2019-08-05 LAB — RAPID URINE DRUG SCREEN, HOSP PERFORMED
Amphetamines: NOT DETECTED
Barbiturates: NOT DETECTED
Benzodiazepines: NOT DETECTED
Cocaine: NOT DETECTED
Opiates: NOT DETECTED
Tetrahydrocannabinol: NOT DETECTED

## 2019-08-05 LAB — LIPASE, BLOOD: Lipase: 42 U/L (ref 11–51)

## 2019-08-05 MED ORDER — ONDANSETRON HCL 4 MG/2ML IJ SOLN
4.0000 mg | Freq: Once | INTRAMUSCULAR | Status: AC
  Administered 2019-08-05: 4 mg via INTRAVENOUS
  Filled 2019-08-05: qty 2

## 2019-08-05 MED ORDER — DICYCLOMINE HCL 10 MG/ML IM SOLN
20.0000 mg | Freq: Once | INTRAMUSCULAR | Status: AC
  Administered 2019-08-05: 20 mg via INTRAMUSCULAR
  Filled 2019-08-05: qty 2

## 2019-08-05 MED ORDER — DICYCLOMINE HCL 20 MG PO TABS: 20.0000 mg | ORAL_TABLET | Freq: Two times a day (BID) | ORAL | 0 refills | Status: DC

## 2019-08-05 MED ORDER — BUPRENORPHINE HCL-NALOXONE HCL 2-0.5 MG SL SUBL: 1.0000 | SUBLINGUAL_TABLET | SUBLINGUAL | Status: DC | PRN

## 2019-08-05 MED ORDER — SODIUM CHLORIDE 0.9 % IV BOLUS
1000.0000 mL | Freq: Once | INTRAVENOUS | Status: AC
  Administered 2019-08-05: 1000 mL via INTRAVENOUS

## 2019-08-05 MED ORDER — BUPRENORPHINE HCL-NALOXONE HCL 8-2 MG SL SUBL: 1.0000 | SUBLINGUAL_TABLET | Freq: Two times a day (BID) | SUBLINGUAL | Status: DC

## 2019-08-05 MED ORDER — ONDANSETRON 4 MG PO TBDP: ORAL_TABLET | ORAL | 0 refills | Status: DC

## 2019-08-05 NOTE — Discharge Instructions (Signed)
Use prescribed Zofran and Bentyl to help with vomiting and abdominal pain.  Sure you are eating small frequent meals and drinking plenty of water.  Use the resources provided, I have also put in a consult to case management to help potentially get you into a Suboxone clinic.  Return to the ED for new or worsening symptoms.

## 2019-08-05 NOTE — ED Provider Notes (Signed)
[facility] EMERGENCY DEPARTMENT Provider Note   CSN: [id number] Arrival date & time: 08/05/19  0844     History Chief Complaint  Patient presents with  . Nausea    Keith Gibbs is a 59 y.o. male.  Keith Gibbs is a 59 y.o. male with a history of substance abuse, MI, CAD, GERD, hepatitis C, COPD, anxiety, who presents to the ED for evaluation of nausea, vomiting and abdominal cramping.  He was seen in the ED on 1/8 with similar symptoms after discontinuing narcotic use.  Patient was previously seen in the pain clinic and on regular oxycodone, he was kicked out of his pain clinic due to cocaine use and then started purchasing morphine off the street, after 3 days without any narcotics he developed persistent vomiting and abdominal cramping.  When he was seen in the ED there was some concern for SI with worsening depression as well and patient had taken extra doses of some of his other medications, he had a TTS evaluation recommending voluntary inpatient hospitalization, but patient later declined and signed out [place].  While he was in the hospital he was given clonidine, Zofran and Bentyl and states that his symptoms were better controlled but when he left AMA he did not receive any prescriptions at discharge and over the past 2 days he has had returning and worsening symptoms.  Has not had any pain medication in 5 days, reports persistent vomiting unable to keep anything down.  Reports some associated cramping abdominal pain in the epigastric and periumbilical regions.  Initially was having some diarrhea that seemed to subside yesterday.  He reports some occasional palpitations, denies chest pain.  Reports chronic shortness of breath related to his COPD that is unchanged.  No associated fevers but he does report some subjective chills.  No cough.  States that he has felt anxious and a bit jittery.  Last used cocaine 3 days ago.        Past Medical History:  Diagnosis  Date  . Anxiety   . Arthritis   . Bursitis   . Chronic pain   . COPD (chronic obstructive pulmonary disease) ([place])   . Depression   . GERD (gastroesophageal reflux disease)   . Hepatitis C 2012   No longer has Hep C  . Hypertension   . Lung disease   . MI (myocardial infarction) ([place])   . Stroke ([place])   . Substance abuse [facility])     Patient Active Problem List   Diagnosis Date Noted  . Testicular discomfort 07/12/2019  . Anxiety 12/10/2018  . Unstable angina ([place]) 11/30/2018  . 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 Chronic anticoagulation (Plavix) 04/19/2018  . Pain medication agreement broken [facility]) 04/19/2018  . Cocaine use 04/18/2018  . Cocaine abuse ([place]) (See 04/12/18 UDS) 04/18/2018  . Marijuana abuse (See 04/12/18 UDS) 04/18/2018  . Long term current use of opiate analgesic 04/12/2018  . Cirrhosis of liver ([place]) 04/03/2018  . BPH (benign prostatic hyperplasia) 04/03/2018  . Asymptomatic microscopic hematuria 04/03/2018  . Osteoarthritis of the knee (Left) 11/02/2017  . Lumbar facet syndrome (Bilateral) 11/01/2017  . Spondylosis without myelopathy or radiculopathy, lumbosacral region 11/01/2017  . Osteoarthritis of shoulder (Right) 11/01/2017  . Osteoarthritis of acromioclavicular joint (Right) 11/01/2017  . 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 Osteoarthritis 11/01/2017  . Cervicalgia 11/01/2017  . Cervical facet syndrome 11/01/2017  . DDD (degenerative disc disease), cervical 11/01/2017  . DDD (degenerative disc  disease), lumbar 11/01/2017  . 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) 09/18/2017  . Chronic neck pain (Fifth Area of Pain) ([place]) 09/18/2017  . Chronic musculoskeletal pain 09/18/2017  . Chronic knee pain (Primary Area of Pain)  (Bilateral) (R>L) 09/11/2017  . Chronic low back pain (Secondary Area of Pain) (Bilateral) 09/11/2017  . Chronic hip pain [facility] Area  of Pain) (Bilateral) (R>L) 09/11/2017  . Chronic shoulder pain (Fourth Area of Pain) (Right) 09/11/2017  . Spondylosis without myelopathy or radiculopathy, cervical region 09/11/2017  . Chronic pain syndrome 09/11/2017  . Opiate use 09/11/2017  . Pharmacologic therapy 09/11/2017  . Disorder of skeletal system 09/11/2017  . Problems influencing health status 09/11/2017  . Overdose of medication, intentional self-harm, sequela ([place]) 12/28/2016  . Tobacco use disorder 12/28/2016  . Severe recurrent major depression without psychotic features ([place]) 12/27/2016  . Coronary artery disease 12/27/2016  . Alcohol use disorder, moderate, dependence ([place]) 12/27/2016  . Cannabis use disorder, moderate, dependence ([place]) 12/27/2016  . Chest pain 06/10/2016  . Bipolar 1 disorder ([place]) 05/27/2013  . Abnormal ejaculation 11/30/2012  . Chest pain, unspecified 11/30/2012  . Schizoaffective disorder ([place]) 06/27/2012  . COPD (chronic obstructive pulmonary disease) ([place]) 06/27/2012  . Chronic hepatitis C ([place]) 06/27/2012  . GERD (gastroesophageal reflux disease) 06/27/2012    Past Surgical History:  Procedure Laterality Date  . ABDOMINAL SURGERY     removed small piece of intestines due to [facility] Diverticulosis  . APPENDECTOMY    . BACK SURGERY    . CARDIAC CATHETERIZATION Left 06/10/2016   Procedure: Left Heart Cath and Coronary Angiography;  Surgeon: Dionisio David, MD;  Location: [facility];  Service: Cardiovascular;  Laterality: Left;  . CARDIAC CATHETERIZATION N/A 06/10/2016   Procedure: Coronary Stent Intervention;  Surgeon: Yolonda Kida, MD;  Location: [facility];  Service: Cardiovascular;  Laterality: N/A;  . COLONOSCOPY    . COLONOSCOPY WITH PROPOFOL N/A 01/05/2017   Procedure: COLONOSCOPY WITH PROPOFOL;  Surgeon: Jonathon Bellows, MD;  Location: [facility] ENDOSCOPY;  Service: Endoscopy;  Laterality: N/A;  . CORONARY ANGIOPLASTY WITH STENT PLACEMENT    .  ESOPHAGOGASTRODUODENOSCOPY (EGD) WITH PROPOFOL N/A 01/05/2017   Procedure: ESOPHAGOGASTRODUODENOSCOPY (EGD) WITH PROPOFOL;  Surgeon: Jonathon Bellows, MD;  Location[facility] ENDOSCOPY;  Service: Endoscopy;  Laterality: N/A;  . KNEE ARTHROSCOPY WITH MEDIAL MENISECTOMY Right 09/05/2017   Procedure: KNEE ARTHROSCOPY WITH MEDIAL AND LATERAL  MENISECTOMY PARTIAL SYNOVECTOMY;  Surgeon: Hessie Knows, MD;  Location: [facility] ORS;  Service: Orthopedics;  Laterality: Right;  . SHOULDER SURGERY Right 04/09/2012  . SPINE SURGERY         Family History  Problem Relation Age of Onset  . Osteoarthritis Mother   . Heart disease Mother   . Hypertension Mother   . Heart disease Father   . Early death Father   . Hypertension Father   . Heart attack Father   . Hypertension Sister   . Prostate cancer Neg Hx   . Bladder Cancer Neg Hx   . Kidney cancer Neg Hx     Social History   Tobacco Use  . Smoking status: Current Some Day Smoker    Packs/day: 0.50    Years: 32.00    Pack years: 16.00    Types: Cigarettes  . Smokeless tobacco: Never Used  Substance Use Topics  . Alcohol use: Yes    Alcohol/week: 6.0 standard drinks    Types: 6 Cans of beer per week  Comment: occassionally   . Drug use: Yes    Types: Cocaine, Marijuana    Comment: Cocaine x4 days ago    Home Medications Prior to Admission medications   Medication Sig Start Date End Date Taking? Authorizing Provider  acetaminophen (TYLENOL) 650 MG CR tablet Take 1,300 mg by mouth every 8 (eight) hours as needed for pain.    Yes [provider]  albuterol (VENTOLIN HFA) 108 (90 Base) MCG/ACT inhaler Inhale 2 puffs into the lungs every 6 (six) hours as needed for wheezing or shortness of breath. 05/28/19  Yes Charlott Rakes, MD  aspirin EC 81 MG tablet Take 81 mg by mouth daily.   Yes [provider]  atorvastatin (LIPITOR) 40 MG tablet Take 1 tablet (40 mg total) by mouth daily at 6 PM. Patient taking differently: Take 40 mg by  mouth daily.  12/30/16  Yes Pucilowska, Jolanta B, MD  cetirizine (ZYRTEC) 10 MG tablet TAKE 1 TABLETS DAILY. Patient taking differently: Take 10 mg by mouth daily.  05/20/19  Yes Charlott Rakes, MD  clopidogrel (PLAVIX) 75 MG tablet Take 75 mg by mouth daily.   Yes [provider]  cyclobenzaprine (FLEXERIL) 10 MG tablet Take 1 tablet (10 mg total) by mouth 2 (two) times daily as needed for muscle spasms. 05/28/19  Yes Newlin, Enobong, MD  DULERA 200-5 MCG/ACT AERO TAKE 2 PUFFS BY MOUTH TWICE A DAY Patient taking differently: Inhale 2 puffs into the lungs 2 (two) times daily.  05/28/19  Yes Charlott Rakes, MD  DULoxetine (CYMBALTA) 60 MG capsule Take 1 capsule (60 mg total) by mouth daily. 05/28/19  Yes Charlott Rakes, MD  gabapentin (NEURONTIN) 300 MG capsule Take 1 capsule (300 mg total) by mouth 2 (two) times daily. 05/28/19  Yes Charlott Rakes, MD  hydrOXYzine (ATARAX/VISTARIL) 25 MG tablet Take 1 tablet (25 mg total) by mouth 3 (three) times daily as needed. 05/28/19  Yes Charlott Rakes, MD  isosorbide mononitrate (IMDUR) 60 MG 24 hr tablet Take 60 mg by mouth daily.   Yes [provider]  Multiple Vitamins-Minerals (CENTRUM MEN PO) Take by mouth daily.   Yes [provider]  nitroGLYCERIN (NITROSTAT) 0.4 MG SL tablet Place 1 tablet (0.4 mg total) under the tongue every 5 (five) minutes x 3 doses as needed for chest pain. 12/10/18  Yes Charlott Rakes, MD  omeprazole (PRILOSEC) 40 MG capsule Take 1 capsule (40 mg total) by mouth daily. 05/28/19  Yes Charlott Rakes, MD  tamsulosin (FLOMAX) 0.4 MG CAPS capsule Take 1 capsule (0.4 mg total) by mouth daily. Must have office visit for refills Patient taking differently: Take 0.4 mg by mouth daily.  05/28/19  Yes Charlott Rakes, MD    Allergies    Anette Guarneri [lurasidone hcl], Naproxen, and Saphris [asenapine]  Review of Systems   Review of Systems  Constitutional: Positive for chills. Negative for fever.  HENT: Negative  for congestion, rhinorrhea and sore throat.   Eyes: Negative for visual disturbance.  Respiratory: Negative for cough and shortness of breath.   Cardiovascular: Negative for chest pain.  Gastrointestinal: Positive for abdominal pain, nausea and vomiting. Negative for diarrhea.  Genitourinary: Negative for dysuria and frequency.  Musculoskeletal: Positive for myalgias. Negative for arthralgias.  Skin: Negative for color change and rash.  Neurological: Negative for dizziness, tremors, syncope and light-headedness.  Psychiatric/Behavioral: Negative for dysphoric mood and suicidal ideas.    Physical Exam Updated Vital Signs BP (!) 177/86 (BP Location: Left Arm)   Pulse (!) 58  Temp 97.8 F (36.6 C) (Oral)   Resp 18   Ht 6\' 3"  (1.905 m)   Wt 97.5 kg   SpO2 100%   BMI 26.87 kg/m   Physical Exam Vitals and nursing note reviewed.  Constitutional:      General: He is not in acute distress.    Appearance: He is well-developed. He is not diaphoretic.     Comments: Patient in no acute distress  HENT:     Head: Normocephalic and atraumatic.     Mouth/Throat:     Mouth: Mucous membranes are moist.     Pharynx: Oropharynx is clear.  Eyes:     General:        Right eye: No discharge.        Left eye: No discharge.     Pupils: Pupils are equal, round, and reactive to light.  Cardiovascular:     Rate and Rhythm: Normal rate and regular rhythm.     Heart sounds: Normal heart sounds. No murmur. No friction rub. No gallop.   Pulmonary:     Effort: Pulmonary effort is normal. No respiratory distress.     Breath sounds: Normal breath sounds. No wheezing or rales.     Comments: Respirations equal and unlabored, patient able to speak in full sentences, lungs clear to auscultation bilaterally Abdominal:     General: Bowel sounds are normal. There is no distension.     Palpations: Abdomen is soft. There is no mass.     Tenderness: There is abdominal tenderness. There is no guarding.      Comments: Abdomen soft, nondistended, bowel sounds present throughout, there is some generalized abdominal tenderness but no focal area of guarding, no rigidity or peritoneal signs  Musculoskeletal:        General: No deformity.     Cervical back: Neck supple.  Skin:    General: Skin is warm and dry.     Capillary Refill: Capillary refill takes less than 2 seconds.  Neurological:     Mental Status: He is alert.     Coordination: Coordination normal.     Comments: Speech is clear, able to follow commands Moves extremities without ataxia, coordination intact  Psychiatric:        Mood and Affect: Mood normal.        Behavior: Behavior normal.     ED Results / Procedures / Treatments   Labs (all labs ordered are listed, but only abnormal results are displayed) Labs Reviewed  COMPREHENSIVE METABOLIC PANEL - Abnormal; Notable for the following components:      Result Value   Glucose, Bld 109 (*)    All other components within normal limits  URINALYSIS, ROUTINE W REFLEX MICROSCOPIC - Abnormal; Notable for the following components:   Color, Urine STRAW (*)    All other components within normal limits  LIPASE, BLOOD  CBC WITH DIFFERENTIAL/PLATELET  RAPID URINE DRUG SCREEN, HOSP PERFORMED    EKG None  Radiology DG Abd Acute W/Chest  Result Date: 08/05/2019 CLINICAL DATA:  Abdominal pain.  Nausea and vomiting. EXAM: DG ABDOMEN ACUTE W/ 1V CHEST COMPARISON:  Radiographs dated 08/02/2019 FINDINGS: There is no evidence of dilated bowel loops or free intraperitoneal air. No radiopaque calculi or other significant radiographic abnormality is seen. Heart size and mediastinal contours are within normal limits. Both lungs are clear. IMPRESSION: Negative abdominal radiographs. No acute cardiopulmonary disease. No significant change since the prior study. Electronically Signed   By: Lorriane Shire M.D.  On: 08/05/2019 10:53    Procedures Procedures (including critical care  time)  Medications Ordered in ED Medications  ondansetron (ZOFRAN) injection 4 mg (4 mg Intravenous Given 08/05/19 1016)  sodium chloride 0.9 % bolus 1,000 mL (0 mLs Intravenous Stopped 08/05/19 1116)  dicyclomine (BENTYL) injection 20 mg (20 mg Intramuscular Given 08/05/19 1016)    ED Course  I have reviewed the triage vital signs and the nursing notes.  Pertinent labs & imaging results that were available during my care of the patient were reviewed by me and considered in my medical decision making (see chart for details).    MDM Rules/Calculators/A&P                      59 year old gentleman who presents with vomiting, generalized abdominal pain, chills and body aches, symptoms started 3 days after he stopped using chronic opioids, he was seen initially on 1/8 for similar symptoms, but was also evaluated for SI at this time, ended up leaving [place] and did not receive any medications to treat his symptoms at home, while in the ED patient was treated with clonidine, Zofran and Bentyl.  He returns today due to worsening symptoms, on arrival he is mildly hypertensive, in no acute distress.  Abdomen with mild generalized tenderness.  Lab work is very reassuring without significant electrolyte derangements, leukocytosis, acute abdominal series is unremarkable.  I suspect the majority of patient's symptoms are related to opioid withdrawal, COWS score of 5 suggest mild-moderate opioid withdrawal.  Discussed with Dr. Sabra Heck who recommended ordering Suboxone for patient, referral for case management and social work to assist patient in Suboxone clinic referral also placed.  Patient also received IV fluids, Zofran and Bentyl.  After Zofran, Bentyl and fluids patient feeling much better, tolerating p.o. food and fluids here in the ED, due to medication scheduling error patient did not receive Suboxone but symptoms have resolved.  Will discharge patient with Zofran and Bentyl to use at home  and I have given him resources for substance abuse treatment, he expresses understanding and agreement and feels stable for discharge home.  Return precautions discussed.  Final Clinical Impression(s) / ED Diagnoses Final diagnoses:  Non-intractable vomiting with nausea, unspecified vomiting type  Generalized abdominal pain  Opiate withdrawal ([place])    Rx / DC Orders ED Discharge Orders         Ordered    ondansetron (ZOFRAN ODT) 4 MG disintegrating tablet     08/05/19 1314    dicyclomine (BENTYL) 20 MG tablet  2 times daily     08/05/19 1314           Janet Berlin 08/05/19 1629    Noemi Chapel, MD 08/05/19 1754

## 2019-08-05 NOTE — ED Notes (Signed)
Pt's belongings checked and no contraband found.

## 2019-08-05 NOTE — ED Triage Notes (Addendum)
PT states continued nausea and vomiting since his last ED visit on 08/02/2019. PT states he left without getting any prescriptions for nausea the other day because he didn't want to placed in rehab for withdrawals. PT is afebrile and anxious upon arrival. Vitals WNL. PT states he last did cocaine x3 days ago and it has been 5 days since he had Morphine 60 mg.

## 2019-08-05 NOTE — Progress Notes (Signed)
Consult request has been received. CSW attempting to follow up at present time  Ray Gervasi M. Salimah Martinovich LCSWA Transitions of Care  Clinical Social Worker  Ph: [phone] 

## 2019-08-06 ENCOUNTER — Encounter: Payer: Self-pay | Admitting: Family Medicine

## 2019-08-12 ENCOUNTER — Ambulatory Visit: Payer: Medicaid Other | Admitting: Family Medicine

## 2019-08-21 ENCOUNTER — Ambulatory Visit: Payer: Medicaid Other | Admitting: Urology

## 2019-08-23 ENCOUNTER — Ambulatory Visit: Payer: Medicaid Other | Admitting: Internal Medicine

## 2019-08-28 ENCOUNTER — Other Ambulatory Visit: Payer: Self-pay | Admitting: Family Medicine

## 2019-09-16 ENCOUNTER — Ambulatory Visit: Payer: Medicaid Other | Admitting: Cardiology

## 2019-09-18 ENCOUNTER — Telehealth: Payer: Self-pay | Admitting: Family Medicine

## 2019-09-18 NOTE — Telephone Encounter (Signed)
Pt is calling and states he received his dismissal letter and that he has been non compliance because his fiance had cancer and she has now passed. Pt wants to know if she will let him be a patient again. Pt requesting a call back

## 2019-10-01 ENCOUNTER — Ambulatory Visit: Payer: Medicaid Other | Admitting: Cardiology

## 2019-10-01 NOTE — Progress Notes (Deleted)
Clinical Summary Keith Gibbs is a 59 y.o.male  Previously seen by Dr Neoma Laming  1. CAD - 2017 notes mention CTA with 90% mid LAD lesion - DES to mid LAD in 2017   2. Substance abuse - ER notes mention previously followed in pain clinic for opiate use, was dismissed for cocaine use.   Past Medical History:  Diagnosis Date  . Anxiety   . Arthritis   . Bursitis   . Chronic pain   . COPD (chronic obstructive pulmonary disease) ([place])   . Depression   . GERD (gastroesophageal reflux disease)   . Hepatitis C 2012   No longer has Hep C  . Hypertension   . Lung disease   . MI (myocardial infarction) ([place])   . Stroke ([place])   . Substance abuse (HCC)      Allergies  Allergen Reactions  . Latuda [Lurasidone Hcl] Other (See Comments)    tremor   . Naproxen Hives and Swelling  . Saphris [Asenapine] Other (See Comments)    Increased tremors      Current Outpatient Medications  Medication Sig Dispense Refill  . acetaminophen (TYLENOL) 650 MG CR tablet Take 1,300 mg by mouth every 8 (eight) hours as needed for pain.     Marland Kitchen albuterol (VENTOLIN HFA) 108 (90 Base) MCG/ACT inhaler Inhale 2 puffs into the lungs every 6 (six) hours as needed for wheezing or shortness of breath. 18 g 6  . aspirin EC 81 MG tablet Take 81 mg by mouth daily.    Marland Kitchen atorvastatin (LIPITOR) 40 MG tablet Take 1 tablet (40 mg total) by mouth daily at 6 PM. (Patient taking differently: Take 40 mg by mouth daily. ) 30 tablet 1  . cetirizine (ZYRTEC) 10 MG tablet TAKE 1 TABLETS DAILY. 30 tablet 2  . clopidogrel (PLAVIX) 75 MG tablet Take 75 mg by mouth daily.    . cyclobenzaprine (FLEXERIL) 10 MG tablet Take 1 tablet (10 mg total) by mouth 2 (two) times daily as needed for muscle spasms. 60 tablet 6  . dicyclomine (BENTYL) 20 MG tablet Take 1 tablet (20 mg total) by mouth 2 (two) times daily. 20 tablet 0  . DULERA 200-5 MCG/ACT AERO TAKE 2 PUFFS BY MOUTH TWICE A DAY (Patient taking differently: Inhale 2  puffs into the lungs 2 (two) times daily. ) 13 g 6  . DULoxetine (CYMBALTA) 60 MG capsule Take 1 capsule (60 mg total) by mouth daily. 60 capsule 6  . gabapentin (NEURONTIN) 300 MG capsule Take 1 capsule (300 mg total) by mouth 2 (two) times daily. 60 capsule 3  . hydrOXYzine (ATARAX/VISTARIL) 25 MG tablet Take 1 tablet (25 mg total) by mouth 3 (three) times daily as needed. 90 tablet 3  . isosorbide mononitrate (IMDUR) 60 MG 24 hr tablet Take 60 mg by mouth daily.    . Multiple Vitamins-Minerals (CENTRUM MEN PO) Take by mouth daily.    . nitroGLYCERIN (NITROSTAT) 0.4 MG SL tablet Place 1 tablet (0.4 mg total) under the tongue every 5 (five) minutes x 3 doses as needed for chest pain. 30 tablet 3  . omeprazole (PRILOSEC) 40 MG capsule Take 1 capsule (40 mg total) by mouth daily. 30 capsule 6  . ondansetron (ZOFRAN ODT) 4 MG disintegrating tablet 78m ODT q4 hours prn nausea/vomit 10 tablet 0  . tamsulosin (FLOMAX) 0.4 MG CAPS capsule TAKE 1 CAPSULE BY MOUTH DAILY. 30 capsule 2   No current facility-administered medications for this visit.  Facility-Administered Medications Ordered in Other Visits  Medication Dose Route Frequency Provider Last Rate Last Admin  . sodium chloride flush (NS) 0.9 % injection 3 mL  3 mL Intravenous Q12H Jake Bathe, FNP         Past Surgical History:  Procedure Laterality Date  . ABDOMINAL SURGERY     removed small piece of intestines due to [facility] Diverticulosis  . APPENDECTOMY    . BACK SURGERY    . CARDIAC CATHETERIZATION Left 06/10/2016   Procedure: Left Heart Cath and Coronary Angiography;  Surgeon: Dionisio David, MD;  Location: [place] [facility];  Service: Cardiovascular;  Laterality: Left;  . CARDIAC CATHETERIZATION N/A 06/10/2016   Procedure: Coronary Stent Intervention;  Surgeon: Yolonda Kida, MD;  Location: [place] [facility];  Service: Cardiovascular;  Laterality: N/A;  . COLONOSCOPY    . COLONOSCOPY WITH PROPOFOL N/A 01/05/2017     Procedure: COLONOSCOPY WITH PROPOFOL;  Surgeon: Jonathon Bellows, MD;  Location: [facility] ENDOSCOPY;  Service: Endoscopy;  Laterality: N/A;  . CORONARY ANGIOPLASTY WITH STENT PLACEMENT    . ESOPHAGOGASTRODUODENOSCOPY (EGD) WITH PROPOFOL N/A 01/05/2017   Procedure: ESOPHAGOGASTRODUODENOSCOPY (EGD) WITH PROPOFOL;  Surgeon: Jonathon Bellows, MD;  Location: [facility] ENDOSCOPY;  Service: Endoscopy;  Laterality: N/A;  . KNEE ARTHROSCOPY WITH MEDIAL MENISECTOMY Right 09/05/2017   Procedure: KNEE ARTHROSCOPY WITH MEDIAL AND LATERAL  MENISECTOMY PARTIAL SYNOVECTOMY;  Surgeon: Hessie Knows, MD;  Location: [facility] ORS;  Service: Orthopedics;  Laterality: Right;  . SHOULDER SURGERY Right 04/09/2012  . SPINE SURGERY       Allergies  Allergen Reactions  . Latuda [Lurasidone Hcl] Other (See Comments)    tremor   . Naproxen Hives and Swelling  . Saphris [Asenapine] Other (See Comments)    Increased tremors       Family History  Problem Relation Age of Onset  . Osteoarthritis Mother   . Heart disease Mother   . Hypertension Mother   . Heart disease Father   . Early death Father   . Hypertension Father   . Heart attack Father   . Hypertension Sister   . Prostate cancer Neg Hx   . Bladder Cancer Neg Hx   . Kidney cancer Neg Hx      Social History Keith Gibbs reports that he has been smoking cigarettes. He has a 16.00 pack-year smoking history. He has never used smokeless tobacco. Keith Gibbs reports current alcohol use of about 6.0 standard drinks of alcohol per week.   Review of Systems CONSTITUTIONAL: No weight loss, fever, chills, weakness or fatigue.  HEENT: Eyes: No visual loss, blurred vision, double vision or yellow sclerae.No hearing loss, sneezing, congestion, runny nose or sore throat.  SKIN: No rash or itching.  CARDIOVASCULAR:  RESPIRATORY: No shortness of breath, cough or sputum.  GASTROINTESTINAL: No anorexia, nausea, vomiting or diarrhea. No abdominal pain or blood.  GENITOURINARY: No  burning on urination, no polyuria NEUROLOGICAL: No headache, dizziness, syncope, paralysis, ataxia, numbness or tingling in the extremities. No change in bowel or bladder control.  MUSCULOSKELETAL: No muscle, back pain, joint pain or stiffness.  LYMPHATICS: No enlarged nodes. No history of splenectomy.  PSYCHIATRIC: No history of depression or anxiety.  ENDOCRINOLOGIC: No reports of sweating, cold or heat intolerance. No polyuria or polydipsia.  Marland Kitchen   Physical Examination There were no vitals filed for this visit. There were no vitals filed for this visit.  Gen: resting comfortably, no acute distress HEENT: no scleral icterus, pupils equal round and reactive, no  palptable cervical adenopathy,  CV Resp: Clear to auscultation bilaterally GI: abdomen is soft, non-tender, non-distended, normal bowel sounds, no hepatosplenomegaly MSK: extremities are warm, no edema.  Skin: warm, no rash Neuro:  no focal deficits Psych: appropriate affect   Diagnostic Studies  11/202017 cath  Prox LAD to Mid LAD lesion, 90 %stenosed.   LVEF 60% with high grade lesion mid LAD and no significant LCX/RCA. PCI with DES.    A STENT XIENCE ALPINE RX 2.5X18 drug eluting stent was successfully placed, and does not overlap previously placed stent.  Mid LAD lesion, 90 %stenosed.  Post intervention, there is a 0% residual stenosis.   Successful PCI and stent of mid LAD lesion with the DES stent 2.5 x 18 mm xience to 14 atm   02/2018 echo: LVEF normal LVEF, grade I DDX Assessment and Plan        Arnoldo Lenis, M.D., F.A.C.C.

## 2019-10-07 ENCOUNTER — Ambulatory Visit (HOSPITAL_COMMUNITY): Payer: Medicaid Other | Admitting: Psychology

## 2019-10-07 ENCOUNTER — Other Ambulatory Visit: Payer: Self-pay

## 2019-10-07 ENCOUNTER — Encounter (HOSPITAL_COMMUNITY): Payer: Self-pay | Admitting: Psychology

## 2019-10-07 NOTE — Progress Notes (Signed)
Keith Gibbs is a 59 y.o. male patient who didn't show for his virtual appointment on webex.  Pt didn't answer phone when counselor called- counselor left a message about missed appointment.          Jan Fireman, [facility]

## 2019-10-09 ENCOUNTER — Ambulatory Visit
Admission: EM | Admit: 2019-10-09 | Discharge: 2019-10-09 | Disposition: A | Discharge status: Home / Self Care | Payer: Medicaid Other | Attending: Emergency Medicine | Admitting: Emergency Medicine

## 2019-10-09 ENCOUNTER — Other Ambulatory Visit: Payer: Self-pay

## 2019-10-09 DIAGNOSIS — Z20822 Contact with and (suspected) exposure to covid-19: Secondary | ICD-10-CM | POA: Diagnosis not present

## 2019-10-09 MED ORDER — BENZONATATE 100 MG PO CAPS: 100.0000 mg | ORAL_CAPSULE | Freq: Three times a day (TID) | ORAL | 0 refills | Status: DC

## 2019-10-09 MED ORDER — FLUTICASONE PROPIONATE 50 MCG/ACT NA SUSP: 1.0000 | Freq: Every day | NASAL | 0 refills | Status: DC

## 2019-10-09 MED ORDER — ZYRTEC ALLERGY 10 MG PO CAPS: 10.0000 mg | ORAL_CAPSULE | ORAL | 0 refills | Status: DC

## 2019-10-09 MED ORDER — PREDNISONE 10 MG (21) PO TBPK: ORAL_TABLET | ORAL | 0 refills | Status: DC

## 2019-10-09 NOTE — ED Triage Notes (Signed)
Pt presents with nasal congestions and cough for past week

## 2019-10-09 NOTE — ED Provider Notes (Signed)
[facility]    CSN: [id number] Arrival date & time: 10/09/19  0957      History   Chief Complaint Chief Complaint  Patient presents with  . Nasal Congestion    HPI Keith RUSZCZYK is a 59 y.o. male.   Presented to the urgent care for complaint of nasal congestion and cough for the past 1 week..  Denies sick exposure to COVID, flu or strep.  Denies recent travel.  Denies aggravating or alleviating symptoms.  Denies previous COVID infection.   Denies fever, chills, fatigue,  rhinorrhea, sore throat,  SOB, wheezing, chest pain, nausea, vomiting, changes in bowel or bladder habits.       Past Medical History:  Diagnosis Date  . Anxiety   . Arthritis   . Bursitis   . Chronic pain   . COPD (chronic obstructive pulmonary disease) ([place])   . Depression   . GERD (gastroesophageal reflux disease)   . Hepatitis C 2012   No longer has Hep C  . Hypertension   . Lung disease   . MI (myocardial infarction) ([place])   . Stroke ([place])   . Substance abuse [facility])     Patient Active Problem List   Diagnosis Date Noted  . Testicular discomfort 07/12/2019  . Anxiety 12/10/2018  . Unstable angina ([place]) 11/30/2018  . 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 Chronic anticoagulation (Plavix) 04/19/2018  . Pain medication agreement broken [facility]) 04/19/2018  . Cocaine use 04/18/2018  . Cocaine abuse ([place]) (See 04/12/18 UDS) 04/18/2018  . Marijuana abuse (See 04/12/18 UDS) 04/18/2018  . Long term current use of opiate analgesic 04/12/2018  . Cirrhosis of liver ([place]) 04/03/2018  . BPH (benign prostatic hyperplasia) 04/03/2018  . Asymptomatic microscopic hematuria 04/03/2018  . Osteoarthritis of the knee (Left) 11/02/2017  . Lumbar facet syndrome (Bilateral) 11/01/2017  . Spondylosis without myelopathy or radiculopathy, lumbosacral region 11/01/2017  . Osteoarthritis of shoulder (Right) 11/01/2017  . Osteoarthritis of acromioclavicular joint (Right) 11/01/2017  . Rotator cuff  tear arthropathy of shoulder (Right) 11/01/2017  . Osteoarthritis 11/01/2017  . Cervicalgia 11/01/2017  . Cervical facet syndrome 11/01/2017  . DDD (degenerative disc disease), cervical 11/01/2017  . DDD (degenerative disc disease), lumbar 11/01/2017  . 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) 09/18/2017  . Chronic neck pain (Fifth Area of Pain) ([place]) 09/18/2017  . Chronic musculoskeletal pain 09/18/2017  . Chronic knee pain (Primary Area of Pain)  (Bilateral) (R>L) 09/11/2017  . Chronic low back pain (Secondary Area of Pain) (Bilateral) 09/11/2017  . Chronic hip pain [facility] Area of Pain) (Bilateral) (R>L) 09/11/2017  . Chronic shoulder pain (Fourth Area of Pain) (Right) 09/11/2017  . Spondylosis without myelopathy or radiculopathy, cervical region 09/11/2017  . Chronic pain syndrome 09/11/2017  . Opiate use 09/11/2017  . Pharmacologic therapy 09/11/2017  . Disorder of skeletal system 09/11/2017  . Problems influencing health status 09/11/2017  . Overdose of medication, intentional self-harm, sequela ([place]) 12/28/2016  . Tobacco use disorder 12/28/2016  . Severe recurrent major depression without psychotic features ([place]) 12/27/2016  . Coronary artery disease 12/27/2016  . Alcohol use disorder, moderate, dependence ([place]) 12/27/2016  . Cannabis use disorder, moderate, dependence ([place]) 12/27/2016  . Chest pain 06/10/2016  . Bipolar 1 disorder ([place]) 05/27/2013  . Abnormal ejaculation 11/30/2012  . Chest pain, unspecified 11/30/2012  . Schizoaffective disorder ([place]) 06/27/2012  . COPD (chronic obstructive pulmonary disease) ([place]) 06/27/2012  . Chronic hepatitis C ([place]) 06/27/2012  .  GERD (gastroesophageal reflux disease) 06/27/2012    Past Surgical History:  Procedure Laterality Date  . ABDOMINAL SURGERY     removed small piece of intestines due to [facility] Diverticulosis  . APPENDECTOMY    . BACK SURGERY    . CARDIAC  CATHETERIZATION Left 06/10/2016   Procedure: Left Heart Cath and Coronary Angiography;  Surgeon: Dionisio David, MD;  Location: [place] [facility];  Service: Cardiovascular;  Laterality: Left;  . CARDIAC CATHETERIZATION N/A 06/10/2016   Procedure: Coronary Stent Intervention;  Surgeon: Yolonda Kida, MD;  Location: [place] [facility];  Service: Cardiovascular;  Laterality: N/A;  . COLONOSCOPY    . COLONOSCOPY WITH PROPOFOL N/A 01/05/2017   Procedure: COLONOSCOPY WITH PROPOFOL;  Surgeon: Jonathon Bellows, MD;  Location[facility] ENDOSCOPY;  Service: Endoscopy;  Laterality: N/A;  . CORONARY ANGIOPLASTY WITH STENT PLACEMENT    . ESOPHAGOGASTRODUODENOSCOPY (EGD) WITH PROPOFOL N/A 01/05/2017   Procedure: ESOPHAGOGASTRODUODENOSCOPY (EGD) WITH PROPOFOL;  Surgeon: Jonathon Bellows, MD;  Location: [facility] ENDOSCOPY;  Service: Endoscopy;  Laterality: N/A;  . KNEE ARTHROSCOPY WITH MEDIAL MENISECTOMY Right 09/05/2017   Procedure: KNEE ARTHROSCOPY WITH MEDIAL AND LATERAL  MENISECTOMY PARTIAL SYNOVECTOMY;  Surgeon: Hessie Knows, MD;  Location: [facility] ORS;  Service: Orthopedics;  Laterality: Right;  . SHOULDER SURGERY Right 04/09/2012  . SPINE SURGERY         Home Medications    Prior to Admission medications   Medication Sig Start Date End Date Taking? Authorizing Provider  acetaminophen (TYLENOL) 650 MG CR tablet Take 1,300 mg by mouth every 8 (eight) hours as needed for pain.     [provider]  albuterol (VENTOLIN HFA) 108 (90 Base) MCG/ACT inhaler Inhale 2 puffs into the lungs every 6 (six) hours as needed for wheezing or shortness of breath. 05/28/19   Charlott Rakes, MD  aspirin EC 81 MG tablet Take 81 mg by mouth daily.    [provider]  atorvastatin (LIPITOR) 40 MG tablet Take 1 tablet (40 mg total) by mouth daily at 6 PM. Patient taking differently: Take 40 mg by mouth daily.  12/30/16   Pucilowska, Jolanta B, MD  benzonatate (TESSALON) 100 MG capsule Take 1 capsule (100 mg total) by  mouth every 8 (eight) hours. 10/09/19   Aidin Doane, Darrelyn Hillock, FNP  Cetirizine HCl (ZYRTEC ALLERGY) 10 MG CAPS Take 1 capsule (10 mg total) by mouth 1 day or 1 dose for 1 dose. 10/09/19 10/10/19  Marialy Urbanczyk, Darrelyn Hillock, FNP  clopidogrel (PLAVIX) 75 MG tablet Take 75 mg by mouth daily.    [provider]  cyclobenzaprine (FLEXERIL) 10 MG tablet Take 1 tablet (10 mg total) by mouth 2 (two) times daily as needed for muscle spasms. 05/28/19   Charlott Rakes, MD  dicyclomine (BENTYL) 20 MG tablet Take 1 tablet (20 mg total) by mouth 2 (two) times daily. 08/05/19   Jacqlyn Larsen, PA-C  DULERA 200-5 MCG/ACT AERO TAKE 2 PUFFS BY MOUTH TWICE A DAY Patient taking differently: Inhale 2 puffs into the lungs 2 (two) times daily.  05/28/19   Charlott Rakes, MD  DULoxetine (CYMBALTA) 60 MG capsule Take 1 capsule (60 mg total) by mouth daily. 05/28/19   Charlott Rakes, MD  fluticasone (FLONASE) 50 MCG/ACT nasal spray Place 1 spray into both nostrils daily for 14 days. 10/09/19 10/23/19  Lynanne Delgreco, Darrelyn Hillock, FNP  gabapentin (NEURONTIN) 300 MG capsule Take 1 capsule (300 mg total) by mouth 2 (two) times daily. 05/28/19   Charlott Rakes, MD  hydrOXYzine (  ATARAX/VISTARIL) 25 MG tablet Take 1 tablet (25 mg total) by mouth 3 (three) times daily as needed. 05/28/19   Charlott Rakes, MD  isosorbide mononitrate (IMDUR) 60 MG 24 hr tablet Take 60 mg by mouth daily.    [provider]  Multiple Vitamins-Minerals (CENTRUM MEN PO) Take by mouth daily.    [provider]  nitroGLYCERIN (NITROSTAT) 0.4 MG SL tablet Place 1 tablet (0.4 mg total) under the tongue every 5 (five) minutes x 3 doses as needed for chest pain. 12/10/18   Charlott Rakes, MD  omeprazole (PRILOSEC) 40 MG capsule Take 1 capsule (40 mg total) by mouth daily. 05/28/19   Charlott Rakes, MD  ondansetron (ZOFRAN ODT) 4 MG disintegrating tablet 4mg  ODT q4 hours prn nausea/vomit 08/05/19   Jacqlyn Larsen, PA-C  tamsulosin (FLOMAX) 0.4 MG CAPS capsule  TAKE 1 CAPSULE BY MOUTH DAILY. 08/29/19   Charlott Rakes, MD    Family History Family History  Problem Relation Age of Onset  . Osteoarthritis Mother   . Heart disease Mother   . Hypertension Mother   . Heart disease Father   . Early death Father   . Hypertension Father   . Heart attack Father   . Hypertension Sister   . Prostate cancer Neg Hx   . Bladder Cancer Neg Hx   . Kidney cancer Neg Hx     Social History Social History   Tobacco Use  . Smoking status: Current Some Day Smoker    Packs/day: 0.50    Years: 32.00    Pack years: 16.00    Types: Cigarettes  . Smokeless tobacco: Never Used  Substance Use Topics  . Alcohol use: Yes    Alcohol/week: 6.0 standard drinks    Types: 6 Cans of beer per week    Comment: occassionally   . Drug use: Yes    Types: Cocaine, Marijuana    Comment: Cocaine x4 days ago     Allergies   Latuda [lurasidone hcl], Naproxen, and Saphris [asenapine]   Review of Systems Review of Systems  Constitutional: Negative.   HENT: Positive for congestion.   Respiratory: Positive for cough.   Cardiovascular: Negative.   Gastrointestinal: Negative.   Neurological: Negative.      Physical Exam Triage Vital Signs ED Triage Vitals  Enc Vitals Group     BP 10/09/19 1005 111/70     Pulse Rate 10/09/19 1005 (!) 54     Resp 10/09/19 1005 20     Temp 10/09/19 1005 98.1 F (36.7 C)     Temp src --      SpO2 10/09/19 1005 97 %     Weight --      Height --      Head Circumference --      Peak Flow --      Pain Score 10/09/19 1002 6     Pain Loc --      Pain Edu? --      Excl. in [place]? --    No data found.  Updated Vital Signs BP 111/70   Pulse (!) 54   Temp 98.1 F (36.7 C)   Resp 20   SpO2 97%   Visual Acuity Right Eye Distance:   Left Eye Distance:   Bilateral Distance:    Right Eye Near:   Left Eye Near:    Bilateral Near:     Physical Exam Vitals and nursing note reviewed.  Constitutional:      General: He is  not  in acute distress.    Appearance: Normal appearance. He is normal weight. He is not ill-appearing or toxic-appearing.  HENT:     Head: Normocephalic.     Right Ear: Tympanic membrane, ear canal and external ear normal. There is no impacted cerumen.     Left Ear: Tympanic membrane, ear canal and external ear normal. There is no impacted cerumen.     Nose: Nose normal. No congestion.     Mouth/Throat:     Mouth: Mucous membranes are moist.     Pharynx: Oropharynx is clear. No oropharyngeal exudate or posterior oropharyngeal erythema.  Cardiovascular:     Rate and Rhythm: Regular rhythm. Bradycardia present.     Pulses: Normal pulses.     Heart sounds: Normal heart sounds. No murmur.  Pulmonary:     Effort: Pulmonary effort is normal. No respiratory distress.     Breath sounds: Normal breath sounds. No wheezing or rhonchi.  Chest:     Chest wall: No tenderness.  Abdominal:     General: Abdomen is flat. Bowel sounds are normal. There is no distension.     Palpations: There is no mass.     Tenderness: There is no abdominal tenderness.  Skin:    Capillary Refill: Capillary refill takes less than 2 seconds.  Neurological:     General: No focal deficit present.     Mental Status: He is alert and oriented to person, place, and time.      UC Treatments / Results  Labs (all labs ordered are listed, but only abnormal results are displayed) Labs Reviewed - No data to display  EKG   Radiology No results found.  Procedures Procedures (including critical care time)  Medications Ordered in UC Medications - No data to display  Initial Impression / Assessment and Plan / UC Course  I have reviewed the triage vital signs and the nursing notes.  Pertinent labs & imaging results that were available during my care of the patient were reviewed by me and considered in my medical decision making (see chart for details).     Patient is stable at discharge. COVID-19 test was ordered Work  note was given Advised patient to [employer] was prescribed Tessalon Perles prescribed Zyrtec was prescribed Strict ER return precaution was given  Final Clinical Impressions(s) / UC Diagnoses   Final diagnoses:  Suspected COVID-19 virus infection     Discharge Instructions     COVID testing ordered.  It will take between 2-7 days for test results.  Someone will contact you regarding abnormal results.    In the meantime: You should remain isolated in your home for 10 days from symptom onset AND greater than 24 hours after symptoms resolution (absence of fever without the use of fever-reducing medication and improvement in respiratory symptoms), whichever is longer Get plenty of rest and push fluids Tessalon Perles prescribed for cough Zyrtec-D prescribed for nasal congestion, runny nose, and/or sore throat Flonase prescribed for nasal congestion and runny nose Use medications daily for symptom relief Use OTC medications like ibuprofen or tylenol as needed fever or pain Call or go to the ED if you have any new or worsening symptoms such as fever, worsening cough, shortness of breath, chest tightness, chest pain, turning blue, changes in mental status, etc...     ED Prescriptions    Medication Sig Dispense Auth. Provider   benzonatate (TESSALON) 100 MG capsule Take 1 capsule (100 mg total) by mouth every 8 (eight) hours.  30 capsule Chizara Mena S, FNP   fluticasone (FLONASE) 50 MCG/ACT nasal spray Place 1 spray into both nostrils daily for 14 days. 16 g Zayanna Pundt, Darrelyn Hillock, FNP   Cetirizine HCl (ZYRTEC ALLERGY) 10 MG CAPS Take 1 capsule (10 mg total) by mouth 1 day or 1 dose for 1 dose. 30 capsule Ena Demary, Darrelyn Hillock, FNP     PDMP not reviewed this encounter.   Emerson Monte, [place] 10/09/19 1015

## 2019-10-09 NOTE — Discharge Instructions (Signed)
COVID testing ordered.  It will take between 2-7 days for test results.  Someone will contact you regarding abnormal results.    In the meantime: You should remain isolated in your home for 10 days from symptom onset AND greater than 24 hours after symptoms resolution (absence of fever without the use of fever-reducing medication and improvement in respiratory symptoms), whichever is longer Get plenty of rest and push fluids Tessalon Perles prescribed for cough Zyrtec-D prescribed for nasal congestion, runny nose, and/or sore throat Flonase prescribed for nasal congestion and runny nose Use medications daily for symptom relief Use OTC medications like ibuprofen or tylenol as needed fever or pain Call or go to the ED if you have any new or worsening symptoms such as fever, worsening cough, shortness of breath, chest tightness, chest pain, turning blue, changes in mental status, etc..Marland Kitchen

## 2019-10-10 LAB — NOVEL CORONAVIRUS, NAA: SARS-CoV-2, NAA: NOT DETECTED

## 2019-10-17 ENCOUNTER — Ambulatory Visit (INDEPENDENT_AMBULATORY_CARE_PROVIDER_SITE_OTHER): Payer: Medicaid Other | Admitting: Psychology

## 2019-10-17 ENCOUNTER — Other Ambulatory Visit: Payer: Self-pay

## 2019-10-17 ENCOUNTER — Encounter (HOSPITAL_COMMUNITY): Payer: Self-pay | Admitting: Psychology

## 2019-10-17 DIAGNOSIS — F331 Major depressive disorder, recurrent, moderate: Secondary | ICD-10-CM

## 2019-10-17 DIAGNOSIS — F1421 Cocaine dependence, in remission: Secondary | ICD-10-CM | POA: Diagnosis not present
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
## 2019-10-23 ENCOUNTER — Encounter: Payer: Self-pay | Admitting: *Deleted

## 2019-10-23 ENCOUNTER — Encounter: Payer: Self-pay | Admitting: Cardiology

## 2019-10-23 ENCOUNTER — Other Ambulatory Visit: Payer: Self-pay

## 2019-10-23 ENCOUNTER — Other Ambulatory Visit: Payer: Self-pay | Admitting: Cardiology

## 2019-10-23 ENCOUNTER — Other Ambulatory Visit (HOSPITAL_COMMUNITY): Payer: Medicaid Other

## 2019-10-23 ENCOUNTER — Ambulatory Visit (INDEPENDENT_AMBULATORY_CARE_PROVIDER_SITE_OTHER): Payer: Medicaid Other | Admitting: Cardiology

## 2019-10-23 VITALS — BP 112/70 | HR 62 | Ht 75.0 in | Wt 218.0 lb

## 2019-10-23 DIAGNOSIS — I2510 Atherosclerotic heart disease of native coronary artery without angina pectoris: Secondary | ICD-10-CM

## 2019-10-23 DIAGNOSIS — R079 Chest pain, unspecified: Secondary | ICD-10-CM

## 2019-10-23 DIAGNOSIS — R002 Palpitations: Secondary | ICD-10-CM

## 2019-10-23 DIAGNOSIS — I351 Nonrheumatic aortic (valve) insufficiency: Secondary | ICD-10-CM

## 2019-10-23 DIAGNOSIS — R0789 Other chest pain: Secondary | ICD-10-CM

## 2019-10-23 MED ORDER — SODIUM CHLORIDE 0.9% FLUSH: 3.0000 mL | Freq: Two times a day (BID) | INTRAVENOUS | Status: DC

## 2019-10-23 NOTE — Progress Notes (Signed)
Clinical Summary Keith Gibbs is a 59 y.o.male seen today as a new consult, referred by Dr Holly Bodily for chest pain and history of CAD  1. CAD - from Dr Laurelyn Sickle notes 05/2016 coronary CTA showed 90% lesion mid LAD in setting of unstable angina - had subsequent DES to LAD lesion at that time - 02/2018 nuclear stress Moderate inferior reversible defect.  02/2018 echo Normal LV systolic dysfunction LVEF 60%, mild inferior hypokinesis, grade I DDX, mild to mod AI  - has had some chest pain - sharp pain left sided, 6/10 in severity. Can have some SOB. Comes on with rest or activity. For example house painting recently climbing up ladder. Pain lasts just a few minutes - ongoing a few weeks. Increase in frequency, increase severity - worst with deep breathing - some increased DOE with activities  2. Palpitations - can have palpitations about 3 times a week - can occur at rest or exertion - lasts a few seconds.  - coffee x 2, no sodas, iced tea x 1 gallon, rare EtoH  3. Substance abuse - from ER notes history of cocaine, EtoH, chronic opiate use   4. Chronic pain - prevoiusly on home opiates, appears he violated pain contract with other substances found in his UDS and was dismissed.   4. Aortic regurgitation - mild to moderate by 2019 echo  Past Medical History:  Diagnosis Date  . Anxiety   . Arthritis   . Bursitis   . Chronic pain   . COPD (chronic obstructive pulmonary disease) ([place])   . Depression   . GERD (gastroesophageal reflux disease)   . Hepatitis C 2012   No longer has Hep C  . Hypertension   . Lung disease   . MI (myocardial infarction) ([place])   . Stroke ([place])   . Substance abuse ([facility])      Allergies  Allergen Reactions  . Latuda [Lurasidone Hcl] Other (See Comments)    tremor   . Naproxen Hives and Swelling  . Saphris [Asenapine] Other (See Comments)    Increased tremors      Current Outpatient Medications  Medication Sig Dispense Refill  .  acetaminophen (TYLENOL) 650 MG CR tablet Take 1,300 mg by mouth every 8 (eight) hours as needed for pain.     Marland Kitchen albuterol (VENTOLIN HFA) 108 (90 Base) MCG/ACT inhaler Inhale 2 puffs into the lungs every 6 (six) hours as needed for wheezing or shortness of breath. 18 g 6  . aspirin EC 81 MG tablet Take 81 mg by mouth daily.    Marland Kitchen atorvastatin (LIPITOR) 40 MG tablet Take 1 tablet (40 mg total) by mouth daily at 6 PM. (Patient not taking: Reported on 10/17/2019) 30 tablet 1  . benzonatate (TESSALON) 100 MG capsule Take 1 capsule (100 mg total) by mouth every 8 (eight) hours. 30 capsule 0  . Cetirizine HCl (ZYRTEC ALLERGY) 10 MG CAPS Take 1 capsule (10 mg total) by mouth 1 day or 1 dose for 30 doses. 30 capsule 0  . clopidogrel (PLAVIX) 75 MG tablet Take 75 mg by mouth daily.    . cyclobenzaprine (FLEXERIL) 10 MG tablet Take 1 tablet (10 mg total) by mouth 2 (two) times daily as needed for muscle spasms. 60 tablet 6  . dicyclomine (BENTYL) 20 MG tablet Take 1 tablet (20 mg total) by mouth 2 (two) times daily. 20 tablet 0  . DULERA 200-5 MCG/ACT AERO TAKE 2 PUFFS BY MOUTH TWICE A DAY (Patient taking  differently: Inhale 2 puffs into the lungs 2 (two) times daily. ) 13 g 6  . DULoxetine (CYMBALTA) 60 MG capsule Take 1 capsule (60 mg total) by mouth daily. (Patient not taking: Reported on 10/17/2019) 60 capsule 6  . fluticasone (FLONASE) 50 MCG/ACT nasal spray Place 1 spray into both nostrils daily for 14 days. 16 g 0  . gabapentin (NEURONTIN) 300 MG capsule Take 1 capsule (300 mg total) by mouth 2 (two) times daily. 60 capsule 3  . hydrOXYzine (ATARAX/VISTARIL) 25 MG tablet Take 1 tablet (25 mg total) by mouth 3 (three) times daily as needed. 90 tablet 3  . isosorbide mononitrate (IMDUR) 60 MG 24 hr tablet Take 60 mg by mouth daily.    . Multiple Vitamins-Minerals (CENTRUM MEN PO) Take by mouth daily.    . nitroGLYCERIN (NITROSTAT) 0.4 MG SL tablet Place 1 tablet (0.4 mg total) under the tongue every 5 (five)  minutes x 3 doses as needed for chest pain. 30 tablet 3  . omeprazole (PRILOSEC) 40 MG capsule Take 1 capsule (40 mg total) by mouth daily. (Patient not taking: Reported on 10/17/2019) 30 capsule 6  . ondansetron (ZOFRAN ODT) 4 MG disintegrating tablet 55m ODT q4 hours prn nausea/vomit 10 tablet 0  . predniSONE (STERAPRED UNI-PAK 21 TAB) 10 MG (21) TBPK tablet Take 6 tabs by mouth daily  for 2 days, then 5 tabs for 2 days, then 4 tabs for 2 days, then 3 tabs for 2 days, 2 tabs for 2 days, then 1 tab by mouth daily for 2 days 42 tablet 0  . tamsulosin (FLOMAX) 0.4 MG CAPS capsule TAKE 1 CAPSULE BY MOUTH DAILY. 30 capsule 2   No current facility-administered medications for this visit.   Facility-Administered Medications Ordered in Other Visits  Medication Dose Route Frequency Provider Last Rate Last Admin  . sodium chloride flush (NS) 0.9 % injection 3 mL  3 mL Intravenous Q12H CJake Bathe FNP         Past Surgical History:  Procedure Laterality Date  . ABDOMINAL SURGERY     [facility]  . APPENDECTOMY    . BACK SURGERY    . CARDIAC CATHETERIZATION Left 06/10/2016   Procedure: Left Heart Cath and Coronary Angiography;  Surgeon: SDionisio David MD;  [place];  Service: Cardiovascular;  Laterality: Left;  . CARDIAC CATHETERIZATION N/A 06/10/2016   Procedure: Coronary Stent Intervention;  Surgeon: DYolonda Kida MD;  [facility];  Service: Cardiovascular;  Laterality: N/A;  . COLONOSCOPY    . COLONOSCOPY WITH PROPOFOL N/A 01/05/2017   Procedure: COLONOSCOPY WITH PROPOFOL;  Surgeon: AJonathon Bellows MD;  [facility];  Service: Endoscopy;  Laterality: N/A;  . CORONARY ANGIOPLASTY WITH STENT PLACEMENT    . ESOPHAGOGASTRODUODENOSCOPY (EGD) WITH PROPOFOL N/A 01/05/2017   Procedure: ESOPHAGOGASTRODUODENOSCOPY (EGD) WITH PROPOFOL;  Surgeon: AJonathon Bellows MD;  [facility];   Service: Endoscopy;  Laterality: N/A;  . KNEE ARTHROSCOPY WITH MEDIAL MENISECTOMY Right 09/05/2017   Procedure: KNEE ARTHROSCOPY WITH MEDIAL AND LATERAL  MENISECTOMY PARTIAL SYNOVECTOMY;  Surgeon: MHessie Knows MD;  Location: [facility];  Service: Orthopedics;  Laterality: Right;  . SHOULDER SURGERY Right 04/09/2012  . SPINE SURGERY       Allergies  Allergen Reactions  . Latuda [Lurasidone Hcl] Other (See Comments)    tremor   . Naproxen Hives and Swelling  . Saphris [Asenapine] Other (See Comments)    Increased  tremors       Family History  Problem Relation Age of Onset  . Osteoarthritis Mother   . Heart disease Mother   . Hypertension Mother   . Depression Mother   . Heart disease Father   . Early death Father   . Hypertension Father   . Heart attack Father   . Hypertension Sister   . Prostate cancer Neg Hx   . Bladder Cancer Neg Hx   . Kidney cancer Neg Hx      Social History Mr. Cuello reports that he has been smoking cigarettes. He has a 16.00 pack-year smoking history. He has never used smokeless tobacco. Mr. Payne reports current alcohol use of about 6.0 standard drinks of alcohol per week.   Review of Systems CONSTITUTIONAL: No weight loss, fever, chills, weakness or fatigue.  HEENT: Eyes: No visual loss, blurred vision, double vision or yellow sclerae.No hearing loss, sneezing, congestion, runny nose or sore throat.  SKIN: No rash or itching.  CARDIOVASCULAR: per hpi RESPIRATORY: No shortness of breath, cough or sputum.  GASTROINTESTINAL: No anorexia, nausea, vomiting or diarrhea. No abdominal pain or blood.  GENITOURINARY: No burning on urination, no polyuria NEUROLOGICAL: No headache, dizziness, syncope, paralysis, ataxia, numbness or tingling in the extremities. No change in bowel or bladder control.  MUSCULOSKELETAL: No muscle, back pain, joint pain or stiffness.  LYMPHATICS: No enlarged nodes. No history of splenectomy.  PSYCHIATRIC: No history of  depression or anxiety.  ENDOCRINOLOGIC: No reports of sweating, cold or heat intolerance. No polyuria or polydipsia.  Marland Kitchen   Physical Examination Today's Vitals   10/23/19 1018  BP: 112/70  Pulse: 62  SpO2: 95%  Weight: 218 lb (98.9 kg)  Height: _0  (1.905 m)   Body mass index is 27.25 kg/m.  Gen: resting comfortably, no acute distress HEENT: no scleral icterus, pupils equal round and reactive, no palptable cervical adenopathy,  CV: RRR, no m/r/g no jvd Resp: Clear to auscultation bilaterally GI: abdomen is soft, non-tender, non-distended, normal bowel sounds, no hepatosplenomegaly MSK: extremities are warm, no edema.  Skin: warm, no rash Neuro:  no focal deficits Psych: appropriate affect   Diagnostic Studies  05/2016 cath  Prox LAD to Mid LAD lesion, 90 %stenosed.   LVEF 60% with high grade lesion mid LAD and no significant LCX/RCA. PCI with DES.  A STENT XIENCE ALPINE RX 2.5X18 drug eluting stent was successfully placed, and does not overlap previously placed stent.  Mid LAD lesion, 90 %stenosed.  Post intervention, there is a 0% residual stenosis.   Successful PCI and stent of mid LAD lesion with the DES stent 2.5 x 18 mm xience to 14 atm  02/2018 nuclear stress Moderate inferior reversible defect.    02/2018 echo Normal LV systolic dysfunction LVEF 60%, mild inferior hypokinesis, grade I DDX, mild to mod AI Assessment and Plan    Assessment and plan 1. CAD/chest pain - history of prior LAD stent - 02/2018 nuclear stress test with moderate inferior ischemia, appears managed medically and had done well - recent progressing chest pain and SOB concerning for possible angina - given history, prior stress findings, and progressing symptosm will plan for cath for further evaluation  2. Palpitations - separate from his chest pain episodes - after cath would plan for 2 week event monitor - discussed weaning caffeine  3. Aortic regurgitation - mild to moderate  by 2019 echo, continue to monitor   I have reviewed the risks, indications, and alternatives to cardiac catheterization,  possible angioplasty, and stenting with the patient  today. Risks include but are not limited to bleeding, infection, vascular injury, stroke, myocardial infection, arrhythmia, kidney injury, radiation-related injury in the case of prolonged fluoroscopy use, emergency cardiac surgery, and death. The patient understands the risks of serious complication is 1-2 in 5809 with diagnostic cardiac cath and 1-2% or less with angioplasty/stenting.    Arnoldo Lenis, M.D.

## 2019-10-23 NOTE — Patient Instructions (Signed)
Your physician recommends that you schedule a follow-up appointment in: 3-4 [place]  Your physician recommends that you continue on your current medications as directed. Please refer to the Current Medication list given to you today.  Your physician has requested that you have a cardiac catheterization. Cardiac catheterization is used to diagnose and/or treat various heart conditions. Doctors may recommend this procedure for a number of different reasons. The most common reason is to evaluate chest pain. Chest pain can be a symptom of coronary artery disease (CAD), and cardiac catheterization can show whether plaque is narrowing or blocking your heart's arteries. This procedure is also used to evaluate the valves, as well as measure the blood flow and oxygen levels in different parts of your heart. For further information please visit [email or web address]. Please follow instruction sheet, as given.  Thank you for choosing [facility]!!

## 2019-10-24 ENCOUNTER — Telehealth: Payer: Self-pay | Admitting: *Deleted

## 2019-10-24 ENCOUNTER — Other Ambulatory Visit: Payer: Self-pay | Admitting: *Deleted

## 2019-10-24 ENCOUNTER — Other Ambulatory Visit (HOSPITAL_COMMUNITY)
Admission: RE | Admit: 2019-10-24 | Discharge: 2019-10-24 | Disposition: A | Discharge status: Home / Self Care | Payer: Medicaid Other | Source: Ambulatory Visit | Attending: Internal Medicine | Admitting: Internal Medicine

## 2019-10-24 DIAGNOSIS — I2510 Atherosclerotic heart disease of native coronary artery without angina pectoris: Secondary | ICD-10-CM

## 2019-10-24 NOTE — Telephone Encounter (Signed)
Call to patient to review  instructions for [facility] 10/25/19. Pt states he has not had  pre procedure BMP/CBC/COVID-19 test done today due to no transportation, requests to cancel [facility] scheduled 10/25/19. Pt states another reason he wants to cancel is because he is recovering from a cold and wants to wait until he feels better.  Pt advised I will cancel [facility] scheduled for 10/25/19 and will forward to Dr Harl Bowie for review and follow up with patient to discuss rescheduling LHC.

## 2019-10-25 ENCOUNTER — Ambulatory Visit (HOSPITAL_COMMUNITY): Admission: RE | Admit: 2019-10-25 | Payer: Medicaid Other | Source: Home / Self Care | Admitting: Internal Medicine

## 2019-10-25 ENCOUNTER — Encounter (HOSPITAL_COMMUNITY): Admission: RE | Payer: Self-pay | Source: Home / Self Care

## 2019-10-25 SURGERY — LEFT HEART CATH AND CORONARY ANGIOGRAPHY
Anesthesia: LOCAL

## 2019-10-25 NOTE — Telephone Encounter (Signed)
Spoke with pt and requested cath be schedule for next Friday 4/6 - will schedule and inform pt - pt aware that would need labs and covid test on Wednesday

## 2019-10-25 NOTE — Telephone Encounter (Signed)
LHC schedule for 4/9 7:30 am with Dr End - pt to arrive at 5:30am - covid test scheduled 4/6 pt to have labs done Tuesday as well - left detailed message for pt and for him to return call to confirm

## 2019-10-29 ENCOUNTER — Other Ambulatory Visit (HOSPITAL_COMMUNITY)
Admission: RE | Admit: 2019-10-29 | Discharge: 2019-10-29 | Disposition: A | Discharge status: Home / Self Care | Payer: Medicaid Other | Source: Ambulatory Visit | Attending: Internal Medicine | Admitting: Internal Medicine

## 2019-10-29 ENCOUNTER — Other Ambulatory Visit: Payer: Self-pay

## 2019-10-29 ENCOUNTER — Other Ambulatory Visit (HOSPITAL_COMMUNITY)
Admission: RE | Admit: 2019-10-29 | Discharge: 2019-10-29 | Disposition: A | Discharge status: Home / Self Care | Payer: Medicaid Other | Source: Ambulatory Visit | Attending: Cardiology | Admitting: Cardiology

## 2019-10-29 DIAGNOSIS — Z01812 Encounter for preprocedural laboratory examination: Secondary | ICD-10-CM | POA: Insufficient documentation

## 2019-10-29 DIAGNOSIS — Z20822 Contact with and (suspected) exposure to covid-19: Secondary | ICD-10-CM | POA: Insufficient documentation

## 2019-10-29 LAB — BASIC METABOLIC PANEL
Anion gap: 10 (ref 5–15)
BUN: 14 mg/dL (ref 6–20)
CO2: 25 mmol/L (ref 22–32)
Calcium: 9.3 mg/dL (ref 8.9–10.3)
Chloride: 102 mmol/L (ref 98–111)
Creatinine, Ser: 1.07 mg/dL (ref 0.61–1.24)
GFR calc Af Amer: >60 mL/min (ref >60)
GFR calc non Af Amer: >60 mL/min (ref >60)
Glucose, Bld: 133 mg/dL — ABNORMAL HIGH (ref 70–99)
Potassium: 3.7 mmol/L (ref 3.5–5.1)
Sodium: 137 mmol/L (ref 135–145)

## 2019-10-29 LAB — CBC
HCT: 45.8 % (ref 39.0–52.0)
Hemoglobin: 14.9 g/dL (ref 13.0–17.0)
MCH: 31.2 pg (ref 26.0–34.0)
MCHC: 32.5 g/dL (ref 30.0–36.0)
MCV: 95.8 fL (ref 80.0–100.0)
Platelets: 242 10*3/uL (ref 150–400)
RBC: 4.78 MIL/uL (ref 4.22–5.81)
RDW: 13.5 % (ref 11.5–15.5)
WBC: 8.1 10*3/uL (ref 4.0–10.5)
nRBC: 0 % (ref 0.0–0.2)

## 2019-10-30 LAB — SARS CORONAVIRUS 2 (TAT 6-24 HRS): SARS Coronavirus 2: NEGATIVE

## 2019-10-31 ENCOUNTER — Telehealth: Payer: Self-pay | Admitting: *Deleted

## 2019-10-31 NOTE — Telephone Encounter (Signed)
Pt contacted pre-catheterization scheduled at [facility] for: Friday November 01, 2019 7:30 AM Verified arrival time and place: [facility]) at: 5:30 AM   No solid food after midnight prior to cath, clear liquids until 5 AM day of procedure.   AM meds can be  taken pre-cath with sip of water including: ASA 81 mg Plavix 75 mg  Confirmed patient has responsible adult to drive home post procedure and observe 24 hours after arriving home: yes  Currently, due to Covid-19 pandemic, only one person will be allowed with patient. Must be the same person for patient's entire stay and will be required to wear a mask. They will be asked to wait in the waiting room for the duration of the patient's stay.  Patients are required to wear a mask when they enter the hospital.      COVID-19 Pre-Screening Questions:  . In the past 7 to 10 days have you had a cough,  shortness of breath, headache, congestion, fever (100 or greater) body aches, chills, sore throat, or sudden loss of taste or sense of smell? No  . Have you been around anyone with known Covid 19 in the past 7 to 10 days? no . Have you been around anyone who is awaiting Covid 19 test results in the past 7 to 10 days? no . Have you been around anyone who has mentioned symptoms of Covid 19 within the past 7 to 10 days? No   I reviewed procedure/mask/visitor instructions/COVID-19 screening questions with patient.

## 2019-11-01 ENCOUNTER — Encounter (HOSPITAL_COMMUNITY): Admission: RE | Payer: Medicaid Other | Source: Home / Self Care

## 2019-11-01 ENCOUNTER — Ambulatory Visit (HOSPITAL_COMMUNITY): Admission: RE | Admit: 2019-11-01 | Payer: Medicaid Other | Source: Home / Self Care | Admitting: Internal Medicine

## 2019-11-01 SURGERY — LEFT HEART CATH AND CORONARY ANGIOGRAPHY
Anesthesia: LOCAL

## 2019-11-05 DIAGNOSIS — C220 Liver cell carcinoma: Secondary | ICD-10-CM | POA: Insufficient documentation

## 2019-11-05 NOTE — Patient Instructions (Signed)

## 2019-11-05 NOTE — Progress Notes (Signed)
NO SHOW

## 2019-11-06 ENCOUNTER — Telehealth: Payer: Self-pay | Admitting: *Deleted

## 2019-11-06 ENCOUNTER — Ambulatory Visit (INDEPENDENT_AMBULATORY_CARE_PROVIDER_SITE_OTHER): Payer: Medicaid Other | Admitting: Adult Health

## 2019-11-06 DIAGNOSIS — C220 Liver cell carcinoma: Secondary | ICD-10-CM

## 2019-11-06 DIAGNOSIS — K7469 Other cirrhosis of liver: Secondary | ICD-10-CM

## 2019-11-06 DIAGNOSIS — F1020 Alcohol dependence, uncomplicated: Secondary | ICD-10-CM

## 2019-11-06 DIAGNOSIS — J431 Panlobular emphysema: Secondary | ICD-10-CM

## 2019-11-06 DIAGNOSIS — F250 Schizoaffective disorder, bipolar type: Secondary | ICD-10-CM

## 2019-11-06 DIAGNOSIS — I25118 Atherosclerotic heart disease of native coronary artery with other forms of angina pectoris: Secondary | ICD-10-CM

## 2019-11-06 DIAGNOSIS — E785 Hyperlipidemia, unspecified: Secondary | ICD-10-CM

## 2019-11-06 DIAGNOSIS — Z0000 Encounter for general adult medical examination without abnormal findings: Secondary | ICD-10-CM

## 2019-11-06 DIAGNOSIS — N400 Enlarged prostate without lower urinary tract symptoms: Secondary | ICD-10-CM

## 2019-11-06 NOTE — Telephone Encounter (Signed)
MyChart message sent - routed to pcp    

## 2019-11-06 NOTE — Telephone Encounter (Signed)
-----   Message from Arnoldo Lenis, MD sent at 11/04/2019 12:42 PM EDT ----- Normal labs  Zandra Abts MD

## 2019-11-13 ENCOUNTER — Ambulatory Visit (INDEPENDENT_AMBULATORY_CARE_PROVIDER_SITE_OTHER): Payer: Medicaid Other | Admitting: Psychology

## 2019-11-13 ENCOUNTER — Other Ambulatory Visit: Payer: Self-pay

## 2019-11-13 DIAGNOSIS — F1421 Cocaine dependence, in remission: Secondary | ICD-10-CM | POA: Diagnosis not present

## 2019-11-13 DIAGNOSIS — F331 Major depressive disorder, recurrent, moderate: Secondary | ICD-10-CM

## 2019-11-13 NOTE — Progress Notes (Signed)
Virtual Visit via Telephone Note  I connected with Keith Gibbs on 11/13/19 at  8:00 AM EDT by telephone and verified that I am speaking with the correct person using two identifiers.   I discussed the limitations, risks, security and privacy concerns of performing an evaluation and management service by telephone and the availability of in person appointments. I also discussed with the patient that there may be a patient responsible charge related to this service. The patient expressed understanding and agreed to proceed.   I discussed the assessment and treatment plan with the patient. The patient was provided an opportunity to ask questions and all were answered. The patient agreed with the plan and demonstrated an understanding of the instructions.   The patient was advised to call back or seek an in-person evaluation if the symptoms worsen or if the condition fails to improve as anticipated.  I provided 41 minutes of non-face-to-face time during this encounter.   Jan Fireman [facility]    THERAPIST PROGRESS NOTE  Session Time: 8am-8.41am  Participation Level: Active  Behavioral Response: NAAlertaffect irritable  Type of Therapy: Individual Therapy  Treatment Goals addressed: Diagnosis: MDD and goal 1  Interventions: CBT and Other: wellness  Summary: Keith Gibbs is a 59 y.o. male who presents with affect labile.  Pt reported he didn't sleep well last night.  Pt reported that pain is unmanaged, pt reported that he is have trouble controlling his anger, a lot on his mind w/ financial struggles and not feeling like he has worth as not able to do things like used to.  Pt reports he is taking vistartil and not helping.  Pt reported that he is scheduled to see PCP next month.  Pt reported he has been having chest pain and was scheduled for procedure to check for blockages but missed as didn't have transportation. Pt reported that he was upset the other week due to not being able to  help w/ expenses- pt is able to do some reframe that now caught up and will get little better over time.  Pt able to also acknowledged that can't approach things in same way due to pain and medical issues and can't expect self to.  Pt reports no use of drugs.  Pt reported that no contact from [place] office to schedule for psychiatrist.    Suicidal/Homicidal: Nowithout intent/plan  Therapist Response: Assessed pt current functioning per pt report.  Processed w/ pt recent anger and mood. Discussed pt thoughts re: self and limitations.  Assisted pt w/ identifying thoughts of acceptance and focus on what can control.  Explored w/pt connecting w/ his services for medication needs and psychiatric needs and utilizing transportation services.    Plan: Return again in 2 weeks, via telephone. Referred for psychiatric services in [place].    Diagnosis: MDD, r/o Bipolar [name], [facility] 11/13/2019

## 2019-11-21 ENCOUNTER — Encounter: Payer: Self-pay | Admitting: Cardiology

## 2019-11-21 ENCOUNTER — Telehealth: Payer: Medicaid Other | Admitting: Cardiology

## 2019-11-21 ENCOUNTER — Telehealth (INDEPENDENT_AMBULATORY_CARE_PROVIDER_SITE_OTHER): Payer: Medicaid Other | Admitting: Cardiology

## 2019-11-21 DIAGNOSIS — I25119 Atherosclerotic heart disease of native coronary artery with unspecified angina pectoris: Secondary | ICD-10-CM | POA: Diagnosis not present

## 2019-11-21 DIAGNOSIS — Z01818 Encounter for other preprocedural examination: Secondary | ICD-10-CM | POA: Diagnosis not present

## 2019-11-21 NOTE — Progress Notes (Signed)
Virtual Visit via Telephone Note   This visit type was conducted due to national recommendations for restrictions regarding the COVID-19 Pandemic (e.g. social distancing) in an effort to limit this patient's exposure and mitigate transmission in our community.  Due to his co-morbid illnesses, this patient is at least at moderate risk for complications without adequate follow up.  This format is felt to be most appropriate for this patient at this time.  The patient did not have access to video technology/had technical difficulties with video requiring transitioning to audio format only (telephone).  All issues noted in this document were discussed and addressed.  No physical exam could be performed with this format.  Please refer to the patient's chart for his  consent to telehealth for [facility].   The patient was identified using 2 identifiers.  Date:  11/21/2019   ID:  Keith Gibbs, DOB July 28, 1960, MRN [id number]  Patient Location: Home Provider Location: Office  PCP:  Patient, No Pcp Per  Cardiologist:  Carlyle Dolly, MD  Electrophysiologist:  None   Evaluation Performed:  Follow-Up Visit  Chief Complaint:  Follow up  History of Present Illness:    Keith Gibbs is a 59 y.o. male seen today for follow up of the following medical problems.    1. CAD - from Dr Laurelyn Sickle notes 05/2016 coronary CTA showed 90% lesion mid LAD in setting of unstable angina - had subsequent DES to LAD lesion at that time - 02/2018 nuclear stress Moderate inferior reversible defect.  02/2018 echo Normal LV systolic dysfunction LVEF 60%, mild inferior hypokinesis, grade I DDX, mild to mod AI  - has had some chest pain - sharp pain left sided, 6/10 in severity. Can have some SOB. Comes on with rest or activity. For example house painting recently climbing up ladder. Pain lasts just a few minutes - ongoing a few weeks. Increase in frequency, increase severity - worst with deep breathing - some  increased DOE with activities   - ongoing symptoms since last visit - was not able to go for cath due to transportation issues, also was getting over a severe cold  2. Palpitations - can have palpitations about 3 times a week - can occur at rest or exertion - lasts a few seconds.  - coffee x 2, no sodas, iced tea x 1 gallon, rare EtoH  3. Substance abuse - from ER notes history of cocaine, EtoH, chronic opiate use   4. Chronic pain - prevoiusly on home opiates, appears he violated pain contract with other substances found in his UDS and was dismissed.   4. Aortic regurgitation - mild to moderate by 2019 echo   The patient does not have symptoms concerning for COVID-19 infection (fever, chills, cough, or new shortness of breath).    Past Medical History:  Diagnosis Date  . Anxiety   . Arthritis   . Bursitis   . Chronic pain   . COPD (chronic obstructive pulmonary disease) ([place])   . Depression   . GERD (gastroesophageal reflux disease)   . Hepatitis C 2012   No longer has Hep C  . Hypertension   . Lung disease   . MI (myocardial infarction) ([place])   . Stroke ([place])   . Substance abuse [facility])    Past Surgical History:  Procedure Laterality Date  . ABDOMINAL SURGERY     removed small piece of intestines due to [facility] Diverticulosis  . APPENDECTOMY    . BACK SURGERY    .  CARDIAC CATHETERIZATION Left 06/10/2016   Procedure: Left Heart Cath and Coronary Angiography;  Surgeon: Dionisio David, MD;  Location: [place] [facility];  Service: Cardiovascular;  Laterality: Left;  . CARDIAC CATHETERIZATION N/A 06/10/2016   Procedure: Coronary Stent Intervention;  Surgeon: Yolonda Kida, MD;  Location: [facility];  Service: Cardiovascular;  Laterality: N/A;  . COLONOSCOPY    . COLONOSCOPY WITH PROPOFOL N/A 01/05/2017   Procedure: COLONOSCOPY WITH PROPOFOL;  Surgeon: Jonathon Bellows, MD;  Location: [facility] ENDOSCOPY;  Service: Endoscopy;  Laterality: N/A;  . CORONARY  ANGIOPLASTY WITH STENT PLACEMENT    . ESOPHAGOGASTRODUODENOSCOPY (EGD) WITH PROPOFOL N/A 01/05/2017   Procedure: ESOPHAGOGASTRODUODENOSCOPY (EGD) WITH PROPOFOL;  Surgeon: Jonathon Bellows, MD;  Location: [facility] ENDOSCOPY;  Service: Endoscopy;  Laterality: N/A;  . KNEE ARTHROSCOPY WITH MEDIAL MENISECTOMY Right 09/05/2017   Procedure: KNEE ARTHROSCOPY WITH MEDIAL AND LATERAL  MENISECTOMY PARTIAL SYNOVECTOMY;  Surgeon: Hessie Knows, MD;  Location: [facility] ORS;  Service: Orthopedics;  Laterality: Right;  . SHOULDER SURGERY Right 04/09/2012  . SPINE SURGERY       Current Meds  Medication Sig  . acetaminophen (TYLENOL) 650 MG CR tablet Take 1,300 mg by mouth every 8 (eight) hours as needed for pain.   [place] Kitchen albuterol (VENTOLIN HFA) 108 (90 Base) MCG/ACT inhaler Inhale 2 puffs into the lungs every 6 (six) hours as needed for wheezing or shortness of breath.  [place] Kitchen aspirin EC 81 MG tablet Take 81 mg by mouth daily.  [place] Kitchen atorvastatin (LIPITOR) 40 MG tablet Take 1 tablet (40 mg total) by mouth daily at 6 PM. (Patient taking differently: Take 40 mg by mouth at bedtime. )  . benzonatate (TESSALON) 100 MG capsule Take 1 capsule (100 mg total) by mouth every 8 (eight) hours.  . Cetirizine HCl (ZYRTEC ALLERGY) 10 MG CAPS Take 1 capsule (10 mg total) by mouth 1 day or 1 dose for 30 doses. (Patient taking differently: Take 10 mg by mouth daily. )  . clopidogrel (PLAVIX) 75 MG tablet Take 75 mg by mouth daily.  . cyclobenzaprine (FLEXERIL) 10 MG tablet Take 1 tablet (10 mg total) by mouth 2 (two) times daily as needed for muscle spasms.  . DULERA 200-5 MCG/ACT AERO TAKE 2 PUFFS BY MOUTH TWICE A DAY (Patient taking differently: Inhale 2 puffs into the lungs 2 (two) times daily. )  . DULoxetine (CYMBALTA) 60 MG capsule Take 1 capsule (60 mg total) by mouth daily.  [place] Kitchen gabapentin (NEURONTIN) 300 MG capsule Take 1 capsule (300 mg total) by mouth 2 (two) times daily.  . hydrOXYzine (ATARAX/VISTARIL) 25 MG tablet Take 1 tablet (25 mg total)  by mouth 3 (three) times daily as needed. (Patient taking differently: Take 25 mg by mouth 3 (three) times daily as needed for anxiety. )  . isosorbide mononitrate (IMDUR) 60 MG 24 hr tablet Take 60 mg by mouth in the morning, at noon, and at bedtime.   . Multiple Vitamins-Minerals (CENTRUM MEN PO) Take 1 tablet by mouth daily.   . nitroGLYCERIN (NITROSTAT) 0.4 MG SL tablet Place 1 tablet (0.4 mg total) under the tongue every 5 (five) minutes x 3 doses as needed for chest pain.  [place] Kitchen omeprazole (PRILOSEC) 40 MG capsule Take 1 capsule (40 mg total) by mouth daily.  . tamsulosin (FLOMAX) 0.4 MG CAPS capsule TAKE 1 CAPSULE BY MOUTH DAILY. (Patient taking differently: Take 0.4 mg by mouth daily. )   Current Facility-Administered Medications for the 11/21/19 encounter (Telemedicine) with Arnoldo Lenis, MD  Medication  . sodium chloride flush (NS) 0.9 % injection 3 mL     Allergies:   Latuda [lurasidone hcl], Naproxen, and Saphris [asenapine]   Social History   Tobacco Use  . Smoking status: Current Some Day Smoker    Packs/day: 0.50    Years: 32.00    Pack years: 16.00    Types: Cigarettes  . Smokeless tobacco: Never Used  Substance Use Topics  . Alcohol use: Yes    Alcohol/week: 6.0 standard drinks    Types: 6 Cans of beer per week    Comment: occassionally   . Drug use: Yes    Types: Cocaine, Marijuana     Family Hx: The patient's family history includes Depression in his mother; Early death in his father; Heart attack in his father; Heart disease in his father and mother; Hypertension in his father, mother, and sister; Osteoarthritis in his mother. There is no history of Prostate cancer, Bladder Cancer, or Kidney cancer.  ROS:   Please see the history of present illness.     All other systems reviewed and are negative.   Prior CV studies:   The following studies were reviewed today:  05/2016 cath  Prox LAD to Mid LAD lesion, 90 %stenosed.  LVEF 60% with high grade  lesion mid LAD and no significant LCX/RCA. PCI with DES.  A STENT XIENCE ALPINE RX 2.5X18 drug eluting stent was successfully placed, and does not overlap previously placed stent.  Mid LAD lesion, 90 %stenosed.  Post intervention, there is a 0% residual stenosis.  Successful PCI and stent of mid LAD lesion with the DES stent 2.5 x 18 mm xience to 14 atm  02/2018 nuclear stress Moderate inferior reversible defect.    02/2018 echo Normal LV systolic dysfunction LVEF 60%, mild inferior hypokinesis, grade I DDX, mild to mod AI Assessment and Plan   Labs/Other Tests and Data Reviewed:    EKG:  No ECG reviewed.  Recent Labs: 08/05/2019: ALT 22 10/29/2019: BUN 14; Creatinine, Ser 1.07; Hemoglobin 14.9; Platelets 242; Potassium 3.7; Sodium 137   Recent Lipid Panel Lab Results  Component Value Date/Time   CHOL 100 04/02/2018 11:11 AM   TRIG 96 04/02/2018 11:11 AM   HDL 42 04/02/2018 11:11 AM   CHOLHDL 2.4 04/02/2018 11:11 AM   CHOLHDL 1.7 12/28/2016 06:51 AM   LDLCALC 39 04/02/2018 11:11 AM    Wt Readings from Last 3 Encounters:  10/23/19 218 lb (98.9 kg)  08/05/19 215 lb (97.5 kg)  08/02/19 215 lb (97.5 kg)     Objective:    Vital Signs:   Today's Vitals   There is no height or weight on file to calculate BMI. Normal affect. Normal speech pattern and tone. Comfortable,no apparent distress. No audible signs of sob or wheezing.   ASSESSMENT & PLAN:    1. CAD/chest pain - history of prior LAD stent - 02/2018 nuclear stress test with moderate inferior ischemia, appears managed medically and had done well - recent progressing chest pain and SOB concerning for possible angina - was not able to have cath after last visit due to transopration issues and cold like symptoms, he reports he is able to have done and would like done the second week in May, plan for Wed May 12 - increase imdur to 27m daily.   2. Palpitations - separate from his chest pain episodes - after cath  would plan for 2 week event monitor    I have reviewed the risks, indications, and  alternatives to cardiac catheterization, possible angioplasty, and stenting with the patient today. Risks include but are not limited to bleeding, infection, vascular injury, stroke, myocardial infection, arrhythmia, kidney injury, radiation-related injury in the case of prolonged fluoroscopy use, emergency cardiac surgery, and death. The patient understands the risks of serious complication is 1-2 in 8001 with diagnostic cardiac cath and 1-2% or less with angioplasty/stenting.   COVID-19 Education: The signs and symptoms of COVID-19 were discussed with the patient and how to seek care for testing (follow up with PCP or arrange E-visit).  The importance of social distancing was discussed today.  Time:   Today, I have spent 15 minutes with the patient with telehealth technology discussing the above problems.     Medication Adjustments/Labs and Tests Ordered: Current medicines are reviewed at length with the patient today.  Concerns regarding medicines are outlined above.   Tests Ordered: No orders of the defined types were placed in this encounter.   Medication Changes: No orders of the defined types were placed in this encounter.   Follow Up:  Either In Person or Virtual in 1 month(s)  Signed, Carlyle Dolly, MD  11/21/2019 12:50 PM    [place]

## 2019-11-21 NOTE — Progress Notes (Signed)
Opened in error  Signed, Carlyle Dolly, MD  11/21/2019 10:53 AM    [place]

## 2019-11-21 NOTE — H&P (View-Only) (Signed)
Opened in error  Signed, Carlyle Dolly, MD  11/21/2019 10:53 AM    [place]

## 2019-11-21 NOTE — Patient Instructions (Signed)
    [place] [address] Dept: [phone] Loc: [place]  11/21/2019  You are scheduled for a Cardiac Catheterization on Wednesday, May 12 with Dr. Sherren Mocha.  1. Please arrive at the [facility] (Main Entrance A) at [facility]: [address] [place], [place] [address] at 9:30 AM (This time is two hours before your procedure to ensure your preparation). Free valet parking service is available.   Special note: Every effort is made to have your procedure done on time. Please understand that emergencies sometimes delay scheduled procedures.  2. Diet: Do not eat solid foods after midnight.  The patient may have clear liquids until 5am upon the day of the procedure.  3. Labs: You will need to have blood drawn on Monday, May 10 at [facility]. You do not need to be fasting.  4. Medication instructions in preparation for your procedure:   Contrast Allergy: No   On the morning of your procedure, take your Aspirin 81 ng and any morning medicines NOT listed above.  You may use sips of water.  5. Plan for one night stay--bring personal belongings. 6. Bring a current list of your medications and current insurance cards. 7. You MUST have a responsible person to drive you home. 8. Someone MUST be with you the first 24 hours after you arrive home or your discharge will be delayed. 9. Please wear clothes that are easy to get on and off and wear slip-on shoes.  Thank you for allowing [place] to care for you!   -- [place] [facility]

## 2019-11-21 NOTE — Addendum Note (Signed)
Addended by: Barbarann Ehlers A on: 11/21/2019 01:12 PM   Modules accepted: Orders

## 2019-12-02 ENCOUNTER — Other Ambulatory Visit (HOSPITAL_COMMUNITY)
Admission: RE | Admit: 2019-12-02 | Discharge: 2019-12-02 | Disposition: A | Discharge status: Home / Self Care | Payer: Medicaid Other | Source: Ambulatory Visit | Attending: Cardiovascular Disease | Admitting: Cardiovascular Disease

## 2019-12-02 ENCOUNTER — Other Ambulatory Visit (HOSPITAL_COMMUNITY)
Admission: RE | Admit: 2019-12-02 | Discharge: 2019-12-02 | Disposition: A | Discharge status: Home / Self Care | Payer: Medicaid Other | Source: Ambulatory Visit | Attending: Cardiology | Admitting: Cardiology

## 2019-12-02 ENCOUNTER — Other Ambulatory Visit: Payer: Self-pay

## 2019-12-02 DIAGNOSIS — Z20822 Contact with and (suspected) exposure to covid-19: Secondary | ICD-10-CM | POA: Insufficient documentation

## 2019-12-02 DIAGNOSIS — I25119 Atherosclerotic heart disease of native coronary artery with unspecified angina pectoris: Secondary | ICD-10-CM | POA: Insufficient documentation

## 2019-12-02 DIAGNOSIS — Z01812 Encounter for preprocedural laboratory examination: Secondary | ICD-10-CM | POA: Diagnosis present

## 2019-12-02 LAB — CBC WITH DIFFERENTIAL/PLATELET
Abs Immature Granulocytes: 0.03 10*3/uL (ref 0.00–0.07)
Basophils Absolute: 0 10*3/uL (ref 0.0–0.1)
Basophils Relative: 0 %
Eosinophils Absolute: 0.3 10*3/uL (ref 0.0–0.5)
Eosinophils Relative: 3 %
HCT: 45.7 % (ref 39.0–52.0)
Hemoglobin: 15.2 g/dL (ref 13.0–17.0)
Immature Granulocytes: 0 %
Lymphocytes Relative: 26 %
Lymphs Abs: 2.6 10*3/uL (ref 0.7–4.0)
MCH: 31.6 pg (ref 26.0–34.0)
MCHC: 33.3 g/dL (ref 30.0–36.0)
MCV: 95 fL (ref 80.0–100.0)
Monocytes Absolute: 0.6 10*3/uL (ref 0.1–1.0)
Monocytes Relative: 6 %
Neutro Abs: 6.3 10*3/uL (ref 1.7–7.7)
Neutrophils Relative %: 65 %
Platelets: 218 10*3/uL (ref 150–400)
RBC: 4.81 MIL/uL (ref 4.22–5.81)
RDW: 13 % (ref 11.5–15.5)
WBC: 9.7 10*3/uL (ref 4.0–10.5)
nRBC: 0 % (ref 0.0–0.2)

## 2019-12-02 LAB — BASIC METABOLIC PANEL
Anion gap: 10 (ref 5–15)
BUN: 14 mg/dL (ref 6–20)
CO2: 24 mmol/L (ref 22–32)
Calcium: 9 mg/dL (ref 8.9–10.3)
Chloride: 100 mmol/L (ref 98–111)
Creatinine, Ser: 1.07 mg/dL (ref 0.61–1.24)
GFR calc Af Amer: >60 mL/min (ref >60)
GFR calc non Af Amer: >60 mL/min (ref >60)
Glucose, Bld: 99 mg/dL (ref 70–99)
Potassium: 4.2 mmol/L (ref 3.5–5.1)
Sodium: 134 mmol/L — ABNORMAL LOW (ref 135–145)

## 2019-12-02 LAB — SARS CORONAVIRUS 2 (TAT 6-24 HRS): SARS Coronavirus 2: NEGATIVE

## 2019-12-03 ENCOUNTER — Telehealth: Payer: Self-pay | Admitting: *Deleted

## 2019-12-03 NOTE — Telephone Encounter (Signed)
Patient is returning call to discuss instructions for procedure scheduled for tomorrow, 12/04/19 at 11:30 AM. Please call.

## 2019-12-03 NOTE — Telephone Encounter (Addendum)
Pt contacted pre-catheterization scheduled at [facility] for: Wednesday Dec 04, 2019 11:30 AM Verified arrival time and place: [place]) at: 9:30 AM   No solid food after midnight prior to cath, clear liquids until 5 AM day of procedure.  AM meds can be  taken pre-cath with sip of water including: ASA 81 mg Plavix 75 mg   Confirmed patient has responsible adult to drive home post procedure and observe 24 hours after arriving home: yes  You are allowed ONE visitor in the waiting room during your procedures. Both you and your visitor must wear masks.      COVID-19 Pre-Screening Questions:  . In the past 7 to 10 days have you had a cough,  shortness of breath, headache, congestion, fever (100 or greater) body aches, chills, sore throat, or sudden loss of taste or sense of smell? no . Have you been around anyone with known Covid 19 in the past 7 to 10 days? no . Have you been around anyone who is awaiting Covid 19 test results in the past 7 to 10 days? no . Have you been around anyone who has mentioned symptoms of Covid 19 within the past 7 to 10 days? no  Reviewed procedure/mask/visitor instructions, COVID-19 screening questions with patient.

## 2019-12-04 ENCOUNTER — Ambulatory Visit (HOSPITAL_COMMUNITY)
Admission: RE | Admit: 2019-12-04 | Discharge: 2019-12-04 | Disposition: A | Discharge status: Home / Self Care | Payer: Medicaid Other | Attending: Cardiovascular Disease | Admitting: Cardiovascular Disease

## 2019-12-04 ENCOUNTER — Ambulatory Visit (HOSPITAL_COMMUNITY)
Admission: RE | Disposition: A | Discharge status: Home / Self Care | Payer: Self-pay | Source: Home / Self Care | Attending: Cardiovascular Disease

## 2019-12-04 ENCOUNTER — Telehealth (HOSPITAL_COMMUNITY): Payer: Medicaid Other | Admitting: Psychology

## 2019-12-04 ENCOUNTER — Other Ambulatory Visit: Payer: Self-pay

## 2019-12-04 DIAGNOSIS — I25118 Atherosclerotic heart disease of native coronary artery with other forms of angina pectoris: Secondary | ICD-10-CM | POA: Diagnosis present

## 2019-12-04 DIAGNOSIS — I2584 Coronary atherosclerosis due to calcified coronary lesion: Secondary | ICD-10-CM | POA: Diagnosis not present

## 2019-12-04 DIAGNOSIS — Z955 Presence of coronary angioplasty implant and graft: Secondary | ICD-10-CM | POA: Diagnosis not present

## 2019-12-04 DIAGNOSIS — I10 Essential (primary) hypertension: Secondary | ICD-10-CM | POA: Diagnosis not present

## 2019-12-04 DIAGNOSIS — Z79899 Other long term (current) drug therapy: Secondary | ICD-10-CM | POA: Insufficient documentation

## 2019-12-04 DIAGNOSIS — Z7982 Long term (current) use of aspirin: Secondary | ICD-10-CM | POA: Diagnosis not present

## 2019-12-04 DIAGNOSIS — Z8619 Personal history of other infectious and parasitic diseases: Secondary | ICD-10-CM | POA: Insufficient documentation

## 2019-12-04 DIAGNOSIS — Z886 Allergy status to analgesic agent status: Secondary | ICD-10-CM | POA: Diagnosis not present

## 2019-12-04 DIAGNOSIS — I25119 Atherosclerotic heart disease of native coronary artery with unspecified angina pectoris: Secondary | ICD-10-CM | POA: Diagnosis not present

## 2019-12-04 DIAGNOSIS — M1990 Unspecified osteoarthritis, unspecified site: Secondary | ICD-10-CM | POA: Insufficient documentation

## 2019-12-04 DIAGNOSIS — I252 Old myocardial infarction: Secondary | ICD-10-CM | POA: Diagnosis not present

## 2019-12-04 DIAGNOSIS — Z888 Allergy status to other drugs, medicaments and biological substances status: Secondary | ICD-10-CM | POA: Diagnosis not present

## 2019-12-04 DIAGNOSIS — F17210 Nicotine dependence, cigarettes, uncomplicated: Secondary | ICD-10-CM | POA: Insufficient documentation

## 2019-12-04 DIAGNOSIS — Z7902 Long term (current) use of antithrombotics/antiplatelets: Secondary | ICD-10-CM | POA: Insufficient documentation

## 2019-12-04 DIAGNOSIS — J449 Chronic obstructive pulmonary disease, unspecified: Secondary | ICD-10-CM | POA: Insufficient documentation

## 2019-12-04 DIAGNOSIS — Z8262 Family history of osteoporosis: Secondary | ICD-10-CM | POA: Insufficient documentation

## 2019-12-04 DIAGNOSIS — Z8249 Family history of ischemic heart disease and other diseases of the circulatory system: Secondary | ICD-10-CM | POA: Insufficient documentation

## 2019-12-04 DIAGNOSIS — I209 Angina pectoris, unspecified: Secondary | ICD-10-CM | POA: Diagnosis present

## 2019-12-04 DIAGNOSIS — Z8673 Personal history of transient ischemic attack (TIA), and cerebral infarction without residual deficits: Secondary | ICD-10-CM | POA: Diagnosis not present

## 2019-12-04 DIAGNOSIS — K219 Gastro-esophageal reflux disease without esophagitis: Secondary | ICD-10-CM | POA: Insufficient documentation

## 2019-12-04 DIAGNOSIS — Z9861 Coronary angioplasty status: Secondary | ICD-10-CM

## 2019-12-04 HISTORY — DX: Coronary angioplasty status: Z98.61

## 2019-12-04 HISTORY — PX: LEFT HEART CATH AND CORONARY ANGIOGRAPHY: CATH118249

## 2019-12-04 HISTORY — PX: INTRAVASCULAR PRESSURE WIRE/FFR STUDY: CATH118243

## 2019-12-04 LAB — POCT ACTIVATED CLOTTING TIME: Activated Clotting Time: 213 seconds

## 2019-12-04 SURGERY — LEFT HEART CATH AND CORONARY ANGIOGRAPHY
Anesthesia: LOCAL

## 2019-12-04 MED ORDER — FENTANYL CITRATE (PF) 100 MCG/2ML IJ SOLN
INTRAMUSCULAR | Status: DC | PRN
  Administered 2019-12-04 (×2): 25 ug via INTRAVENOUS

## 2019-12-04 MED ORDER — HEPARIN SODIUM (PORCINE) 1000 UNIT/ML IJ SOLN
INTRAMUSCULAR | Status: AC
  Filled 2019-12-04: qty 1

## 2019-12-04 MED ORDER — ACETAMINOPHEN 325 MG PO TABS: 650.0000 mg | ORAL_TABLET | ORAL | Status: DC | PRN

## 2019-12-04 MED ORDER — FENTANYL CITRATE (PF) 100 MCG/2ML IJ SOLN
INTRAMUSCULAR | Status: AC
  Filled 2019-12-04: qty 2

## 2019-12-04 MED ORDER — SODIUM CHLORIDE 0.9 % WEIGHT BASED INFUSION
3.0000 mL/kg/h | INTRAVENOUS | Status: AC
  Administered 2019-12-04 (×2): 3 mL/kg/h via INTRAVENOUS

## 2019-12-04 MED ORDER — HYDRALAZINE HCL 20 MG/ML IJ SOLN: 10.0000 mg | INTRAMUSCULAR | Status: DC | PRN

## 2019-12-04 MED ORDER — SODIUM CHLORIDE 0.9% FLUSH: 3.0000 mL | Freq: Two times a day (BID) | INTRAVENOUS | Status: DC

## 2019-12-04 MED ORDER — ONDANSETRON HCL 4 MG/2ML IJ SOLN: 4.0000 mg | Freq: Four times a day (QID) | INTRAMUSCULAR | Status: DC | PRN

## 2019-12-04 MED ORDER — SODIUM CHLORIDE 0.9 % WEIGHT BASED INFUSION: 1.0000 mL/kg/h | INTRAVENOUS | Status: DC

## 2019-12-04 MED ORDER — ISOSORBIDE MONONITRATE ER 60 MG PO TB24: 60.0000 mg | ORAL_TABLET | Freq: Every day | ORAL | 5 refills | Status: DC

## 2019-12-04 MED ORDER — SODIUM CHLORIDE 0.9% FLUSH: 3.0000 mL | INTRAVENOUS | Status: DC | PRN

## 2019-12-04 MED ORDER — SODIUM CHLORIDE 0.9 % IV SOLN: 250.0000 mL | INTRAVENOUS | Status: DC | PRN

## 2019-12-04 MED ORDER — LABETALOL HCL 5 MG/ML IV SOLN: 10.0000 mg | INTRAVENOUS | Status: DC | PRN

## 2019-12-04 MED ORDER — VERAPAMIL HCL 2.5 MG/ML IV SOLN
INTRAVENOUS | Status: DC | PRN
  Administered 2019-12-04: 10 mL via INTRA_ARTERIAL

## 2019-12-04 MED ORDER — LIDOCAINE HCL (PF) 1 % IJ SOLN
INTRAMUSCULAR | Status: AC
  Filled 2019-12-04: qty 30

## 2019-12-04 MED ORDER — CLOPIDOGREL BISULFATE 75 MG PO TABS: 75.0000 mg | ORAL_TABLET | ORAL | Status: DC

## 2019-12-04 MED ORDER — MIDAZOLAM HCL 2 MG/2ML IJ SOLN
INTRAMUSCULAR | Status: DC | PRN
  Administered 2019-12-04: 1 mg via INTRAVENOUS
  Administered 2019-12-04: 2 mg via INTRAVENOUS

## 2019-12-04 MED ORDER — HEPARIN (PORCINE) IN NACL 1000-0.9 UT/500ML-% IV SOLN
INTRAVENOUS | Status: DC | PRN
  Administered 2019-12-04 (×2): 500 mL

## 2019-12-04 MED ORDER — SODIUM CHLORIDE 0.9 % WEIGHT BASED INFUSION
1.0000 mL/kg/h | INTRAVENOUS | Status: DC
  Administered 2019-12-04: 500 mL via INTRAVENOUS

## 2019-12-04 MED ORDER — ASPIRIN 81 MG PO CHEW: 81.0000 mg | CHEWABLE_TABLET | ORAL | Status: DC

## 2019-12-04 MED ORDER — MIDAZOLAM HCL 2 MG/2ML IJ SOLN
INTRAMUSCULAR | Status: AC
  Filled 2019-12-04: qty 2

## 2019-12-04 MED ORDER — HEPARIN SODIUM (PORCINE) 1000 UNIT/ML IJ SOLN
INTRAMUSCULAR | Status: DC | PRN
  Administered 2019-12-04: 3000 [IU] via INTRAVENOUS
  Administered 2019-12-04: 5000 [IU] via INTRAVENOUS

## 2019-12-04 MED ORDER — VERAPAMIL HCL 2.5 MG/ML IV SOLN
INTRAVENOUS | Status: AC
  Filled 2019-12-04: qty 2

## 2019-12-04 MED ORDER — HEPARIN (PORCINE) IN NACL 1000-0.9 UT/500ML-% IV SOLN
INTRAVENOUS | Status: AC
  Filled 2019-12-04: qty 1000

## 2019-12-04 MED ORDER — IOHEXOL 350 MG/ML SOLN
INTRAVENOUS | Status: DC | PRN
  Administered 2019-12-04: 65 mL

## 2019-12-04 SURGICAL SUPPLY — 13 items
CATH 5FR JL3.5 JR4 ANG PIG MP (CATHETERS) ×1 IMPLANT
CATH VISTA GUIDE 6FR XBLAD3.5 (CATHETERS) ×1 IMPLANT
DEVICE RAD COMP TR BAND LRG (VASCULAR PRODUCTS) ×1 IMPLANT
GLIDESHEATH SLEND SS 6F .021 (SHEATH) ×1 IMPLANT
GUIDEWIRE INQWIRE 1.5J.035X260 (WIRE) IMPLANT
GUIDEWIRE PRESSURE X 175 (WIRE) ×1 IMPLANT
INQWIRE 1.5J .035X260CM (WIRE) ×2
KIT ENCORE 26 ADVANTAGE (KITS) ×1 IMPLANT
KIT HEART LEFT (KITS) ×2 IMPLANT
PACK CARDIAC CATHETERIZATION (CUSTOM PROCEDURE TRAY) ×2 IMPLANT
SYR MEDRAD MARK 7 150ML (SYRINGE) ×2 IMPLANT
TRANSDUCER W/STOPCOCK (MISCELLANEOUS) ×2 IMPLANT
TUBING CIL FLEX 10 FLL-RA (TUBING) ×2 IMPLANT

## 2019-12-04 NOTE — Interval H&P Note (Signed)
Cath Lab Visit (complete for each Cath Lab visit)  Clinical Evaluation Leading to the Procedure:   ACS: No.  Non-ACS:    Anginal Classification: CCS III  Anti-ischemic medical therapy: Minimal Therapy (1 class of medications)  Non-Invasive Test Results: No non-invasive testing performed  Prior CABG: No previous CABG      History and Physical Interval Note:  12/04/2019 1:01 PM  Keith Gibbs  has presented today for surgery, with the diagnosis of chest pain.  The various methods of treatment have been discussed with the patient and family. After consideration of risks, benefits and other options for treatment, the patient has consented to  Procedure(s): LEFT HEART CATH AND CORONARY ANGIOGRAPHY (N/A) as a surgical intervention.  The patient's history has been reviewed, patient examined, no change in status, stable for surgery.  I have reviewed the patient's chart and labs.  Questions were answered to the patient's satisfaction.     Sherren Mocha

## 2019-12-04 NOTE — Discharge Instructions (Signed)
Drink plenty of fluids for 48 hours and keep wrist elevated at heart level for 24 hours  Radial Site Care   This sheet gives you information about how to care for yourself after your procedure. Your health care provider may also give you more specific instructions. If you have problems or questions, contact your health care provider. What can I expect after the procedure? After the procedure, it is common to have:  Bruising and tenderness at the catheter insertion area. Follow these instructions at home: Medicines  Take over-the-counter and prescription medicines only as told by your health care provider. Insertion site care 1. Follow instructions from your health care provider about how to take care of your insertion site. Make sure you: ? Wash your hands with soap and water before you change your bandage (dressing). If soap and water are not available, use hand sanitizer. ? Remove your dressing as told by your health care provider. In 24 hours 2. Check your insertion site every day for signs of infection. Check for: ? Redness, swelling, or pain. ? Fluid or blood. ? Pus or a bad smell. ? Warmth. 3. Do not take baths, swim, or use a hot tub until your health care provider approves. 4. You may shower 24-48 hours after the procedure, or as directed by your health care provider. ? Remove the dressing and gently wash the site with plain soap and water. ? Pat the area dry with a clean towel. ? Do not rub the site. That could cause bleeding. 5. Do not apply powder or lotion to the site. Activity   1. For 24 hours after the procedure, or as directed by your health care provider: ? Do not flex or bend the affected arm. ? Do not push or pull heavy objects with the affected arm. ? Do not drive yourself home from the hospital or clinic. You may drive 24 hours after the procedure unless your health care provider tells you not to. ? Do not operate machinery or power tools. 2. Do not lift  anything that is heavier than 10 lb (4.5 kg), or the limit that you are told, until your health care provider says that it is safe.  For 4 days 3. Ask your health care provider when it is okay to: ? Return to work or school. ? Resume usual physical activities or sports. ? Resume sexual activity. General instructions  If the catheter site starts to bleed, raise your arm and put firm pressure on the site. If the bleeding does not stop, get help right away. This is a medical emergency.  If you went home on the same day as your procedure, a responsible adult should be with you for the first 24 hours after you arrive home.  Keep all follow-up visits as told by your health care provider. This is important. Contact a health care provider if:  You have a fever.  You have redness, swelling, or yellow drainage around your insertion site. Get help right away if:  You have unusual pain at the radial site.  The catheter insertion area swells very fast.  The insertion area is bleeding, and the bleeding does not stop when you hold steady pressure on the area.  Your arm or hand becomes pale, cool, tingly, or numb. These symptoms may represent a serious problem that is an emergency. Do not wait to see if the symptoms will go away. Get medical help right away. Call your local emergency services (911 in the [place] Do   not drive yourself to the hospital. Summary  After the procedure, it is common to have bruising and tenderness at the site.  Follow instructions from your health care provider about how to take care of your radial site wound. Check the wound every day for signs of infection.  Do not lift anything that is heavier than 10 lb (4.5 kg), or the limit that you are told, until your health care provider says that it is safe. This information is not intended to replace advice given to you by your health care provider. Make sure you discuss any questions you have with your health care  provider. Document Revised: 08/16/2017 Document Reviewed: 08/16/2017 Elsevier Patient Education  2020 [employer]  

## 2019-12-05 MED FILL — Lidocaine HCl Local Preservative Free (PF) Inj 1%: INTRAMUSCULAR | Qty: 30 | Status: AC

## 2019-12-11 NOTE — Progress Notes (Signed)
New patient visit   Patient: Keith Gibbs   DOB: Jan 04, 1961   59 y.o. Male  MRN: [id number] Visit Date: 12/12/2019  Today's healthcare provider: Marcille Buffy, FNP   Chief Complaint  Patient presents with  . New Patient (Initial Visit)   Subjective    Keith Gibbs is a 59 y.o. male who presents today as a new patient to establish care.  HPI   Allergies  Allergen Reactions  . Latuda [Lurasidone Hcl] Other (See Comments)    tremor   . Naproxen Hives and Swelling  . Saphris [Asenapine] Other (See Comments)    Increased tremors     Patient reports that he feels fairly well today but would like to address concerns of swelling to his right testicle that has been occurring for over 30 days.  He reports he has had this pain in the past and he denies ever having an ultrasound.  He denies any history of STDs.   Patient states that swelling is intermittent but states that we he does notice swelling in testicle he has a shooting pain on the right side of his pelvis. Patient reports that he is in a monogamous relationship but has concerns of exposure to potential STD. Patient reports that he has noticed a lesion on his penis. He denies any past STD's. He and his partner just recently got back together.  Denies any penile discharge, or itching.   Right testicular swelling and pain x 2 weeks.    Patient would also like to discuss today referral for pain clinic, patient reports that he has a history of chronic pain. Patient reports that he is not currently taking medication for pain relief and was using cocaine to help manage his pain, patient states today that he is 2.5 weeks sober.    He sees Dr. Burt Knack January 08, 2020 for follow up on his heart blockage. Reviewed cath on 12/04/2019.  Patient  denies any fever, body aches,chills, rash, chest pain, shortness of breath, nausea, vomiting, or diarrhea.  He specifically requested a referral back to Dr. Yancey Flemings and pain clinic.  He  does not want to go to pain management at any other location.  He is currently on Plavix.  He sees cardiology.  Denies any new symptoms since seen cardiology last. History of lumbar back surgery, " 2 rods, 2 screws, two discs fixed" " unknown year or  Doctor "   Sees Dr. Arvella Nigh right knee. He had an injection in his knee on 05/06/2019.  He also has bilateral chronic feet and ankle pain. No swelling. Has pain in feet and ankle and in arch of right foot x 1 year.   He denies any active chest pain. Denies any rectal pain or bleeding.  Patient  denies any fever,chills, rash, chest pain, shortness of breath, nausea, vomiting, or diarrhea.  Denies dizziness, lightheadedness, pre syncopal or syncopal episodes.    Past Medical History:  Diagnosis Date  . Anxiety   . Arthritis   . Bursitis   . Chronic pain   . COPD (chronic obstructive pulmonary disease) ([place])   . Depression   . GERD (gastroesophageal reflux disease)   . Hepatitis C 2012   No longer has Hep C  . Hypertension   . Lung disease   . MI (myocardial infarction) ([place])   . Stroke ([place])   . Substance abuse [facility])    Past Surgical History:  Procedure Laterality Date  . ABDOMINAL SURGERY  removed small piece of intestines due to [facility] Diverticulosis  . APPENDECTOMY    . BACK SURGERY    . CARDIAC CATHETERIZATION Left 06/10/2016   Procedure: Left Heart Cath and Coronary Angiography;  Surgeon: Dionisio David, MD;  Location: [place] [facility];  Service: Cardiovascular;  Laterality: Left;  . CARDIAC CATHETERIZATION N/A 06/10/2016   Procedure: Coronary Stent Intervention;  Surgeon: Yolonda Kida, MD;  Location: [place] [facility];  Service: Cardiovascular;  Laterality: N/A;  . COLONOSCOPY    . COLONOSCOPY WITH PROPOFOL N/A 01/05/2017   Procedure: COLONOSCOPY WITH PROPOFOL;  Surgeon: Jonathon Bellows, MD;  Location: [facility] ENDOSCOPY;  Service: Endoscopy;  Laterality: N/A;  . CORONARY ANGIOPLASTY WITH STENT PLACEMENT    .  ESOPHAGOGASTRODUODENOSCOPY (EGD) WITH PROPOFOL N/A 01/05/2017   Procedure: ESOPHAGOGASTRODUODENOSCOPY (EGD) WITH PROPOFOL;  Surgeon: Jonathon Bellows, MD;  Location: [facility] ENDOSCOPY;  Service: Endoscopy;  Laterality: N/A;  . INTRAVASCULAR PRESSURE WIRE/FFR STUDY N/A 12/04/2019   Procedure: INTRAVASCULAR PRESSURE WIRE/FFR STUDY;  Surgeon: Sherren Mocha, MD;  Location: [place] [facility];  Service: Cardiovascular;  Laterality: N/A;  . KNEE ARTHROSCOPY WITH MEDIAL MENISECTOMY Right 09/05/2017   Procedure: KNEE ARTHROSCOPY WITH MEDIAL AND LATERAL  MENISECTOMY PARTIAL SYNOVECTOMY;  Surgeon: Hessie Knows, MD;  Location: [facility] ORS;  Service: Orthopedics;  Laterality: Right;  . LEFT HEART CATH AND CORONARY ANGIOGRAPHY N/A 12/04/2019   Procedure: LEFT HEART CATH AND CORONARY ANGIOGRAPHY;  Surgeon: Sherren Mocha, MD;  Location: [place] [facility];  Service: Cardiovascular;  Laterality: N/A;  . SHOULDER SURGERY Right 04/09/2012  . SPINE SURGERY     Family Status  Relation Name Status  . Mother  Alive  . Father  Deceased  . Sister  (Not Specified)  . Mat Aunt  Deceased  . Mat Uncle  Deceased  . Ethlyn Daniels  Deceased  . Annamarie Major  Deceased  . MGM  Deceased  . MGF  Deceased  . PGM  Deceased  . PGF  Deceased  . Neg Hx  (Not Specified)   Family History  Problem Relation Age of Onset  . Osteoarthritis Mother   . Heart disease Mother   . Hypertension Mother   . Depression Mother   . Heart disease Father   . Early death Father   . Hypertension Father   . Heart attack Father   . Hypertension Sister   . Prostate cancer Neg Hx   . Bladder Cancer Neg Hx   . Kidney cancer Neg Hx    Social History   Socioeconomic History  . Marital status: Widowed    Spouse name: Not on file  . Number of children: Not on file  . Years of education: Not on file  . Highest education level: Not on file  Occupational History  . Occupation: disability  . Occupation: stocking at gas station    Comment: 2 days per week    Tobacco Use  . Smoking status: Current Some Day Smoker    Packs/day: 0.50    Years: 32.00    Pack years: 16.00    Types: Cigarettes  . Smokeless tobacco: Never Used  Substance and Sexual Activity  . Alcohol use: Yes    Alcohol/week: 6.0 standard drinks    Types: 6 Cans of beer per week    Comment: occassionally   . Drug use: Yes    Types: Cocaine, Marijuana  . Sexual activity: Yes    Partners: Female    Birth control/protection: None  Other Topics Concern  .  Not on file  Social History Narrative  . Not on file   Social Determinants of Health   Financial Resource Strain:   . Difficulty of Paying Living Expenses:   Food Insecurity:   . Worried About Charity fundraiser in the Last Year:   . Arboriculturist in the Last Year:   Transportation Needs:   . Film/video editor (Medical):   Marland Kitchen Lack of Transportation (Non-Medical):   Physical Activity:   . Days of Exercise per Week:   . Minutes of Exercise per Session:   Stress:   . Feeling of Stress :   Social Connections:   . Frequency of Communication with Friends and Family:   . Frequency of Social Gatherings with Friends and Family:   . Attends Religious Services:   . Active Member of Clubs or Organizations:   . Attends Archivist Meetings:   Marland Kitchen Marital Status:    Outpatient Medications Prior to Visit  Medication Sig  . albuterol (VENTOLIN HFA) 108 (90 Base) MCG/ACT inhaler Inhale 2 puffs into the lungs every 6 (six) hours as needed for wheezing or shortness of breath.  Marland Kitchen aspirin EC 81 MG tablet Take 81 mg by mouth daily.  Marland Kitchen atorvastatin (LIPITOR) 40 MG tablet Take 1 tablet (40 mg total) by mouth daily at 6 PM. (Patient taking differently: Take 40 mg by mouth at bedtime. )  . Cetirizine HCl (ZYRTEC ALLERGY) 10 MG CAPS Take 1 capsule (10 mg total) by mouth 1 day or 1 dose for 30 doses. (Patient taking differently: Take 10 mg by mouth daily. )  . clopidogrel (PLAVIX) 75 MG tablet Take 75 mg by mouth daily.  .  DULERA 200-5 MCG/ACT AERO TAKE 2 PUFFS BY MOUTH TWICE A DAY (Patient taking differently: Inhale 2 puffs into the lungs 2 (two) times daily. )  . DULoxetine (CYMBALTA) 60 MG capsule Take 1 capsule (60 mg total) by mouth daily.  . fluticasone (FLONASE) 50 MCG/ACT nasal spray Place 1 spray into both nostrils daily for 14 days.  Marland Kitchen gabapentin (NEURONTIN) 300 MG capsule Take 1 capsule (300 mg total) by mouth 2 (two) times daily.  . hydrOXYzine (ATARAX/VISTARIL) 25 MG tablet Take 1 tablet (25 mg total) by mouth 3 (three) times daily as needed. (Patient taking differently: Take 25 mg by mouth 3 (three) times daily as needed for anxiety. )  . isosorbide mononitrate (IMDUR) 60 MG 24 hr tablet Take 1 tablet (60 mg total) by mouth daily.  . Multiple Vitamins-Minerals (CENTRUM MEN PO) Take 1 tablet by mouth daily.   . nitroGLYCERIN (NITROSTAT) 0.4 MG SL tablet Place 1 tablet (0.4 mg total) under the tongue every 5 (five) minutes x 3 doses as needed for chest pain.  Marland Kitchen omeprazole (PRILOSEC) 40 MG capsule Take 1 capsule (40 mg total) by mouth daily.  . tamsulosin (FLOMAX) 0.4 MG CAPS capsule TAKE 1 CAPSULE BY MOUTH DAILY. (Patient taking differently: Take 0.4 mg by mouth daily. )   Facility-Administered Medications Prior to Visit  Medication Dose Route Frequency Provider  . sodium chloride flush (NS) 0.9 % injection 3 mL  3 mL Intravenous Q12H Jake Bathe, FNP  . sodium chloride flush (NS) 0.9 % injection 3 mL  3 mL Intravenous Q12H Branch, Alphonse Guild, MD   Allergies  Allergen Reactions  . Latuda [Lurasidone Hcl] Other (See Comments)    tremor   . Naproxen Hives and Swelling  . Saphris [Asenapine] Other (See Comments)    Increased  tremors     Immunization History  Administered Date(s) Administered  . Hep A / Hep B 06/28/2011, 01/10/2012  . Hepatitis B, adult 08/22/2011  . Influenza, Seasonal, Injecte, Preservative Fre 06/28/2011, 04/23/2012  . Influenza,inj,Quad PF,6+ Mos 06/11/2016,  04/02/2018  . Influenza-Unspecified 05/25/2013, 05/25/2018  . Pneumococcal Polysaccharide-23 04/23/2012  . Tdap 03/05/2016    Health Maintenance  Topic Date Due  . HIV Screening  Never done  . COVID-19 Vaccine (1) Never done  . COLONOSCOPY  01/06/2020  . INFLUENZA VACCINE  02/23/2020  . TETANUS/TDAP  03/05/2026  . Hepatitis C Screening  Completed    Patient Care Team: Manuelito Poage, Kelby Aline, FNP as PCP - General (Family Medicine) Harl Bowie, Alphonse Guild, MD as PCP - Cardiology (Cardiology) Festus Aloe, MD as Consulting Physician (Urology) Christene Lye, MD (General Surgery) Earleen Newport, MD as Consulting Physician (Emergency Medicine) Carmie End, Dannielle Karvonen, PA-C as Physician Assistant (Emergency Medicine) Lavera Guise, MD as Consulting Physician (Internal Medicine) Ronnell Freshwater, NP as Nurse Practitioner (Family Medicine) Jodi Marble, MD as Consulting Physician (Internal Medicine) Harvest Dark, MD as Consulting Physician (Emergency Medicine) Darel Hong, MD as Consulting Physician (Emergency Medicine) Alric Quan as Physician Assistant (Emergency Medicine) [name], MD as Consulting Physician (Anesthesiology) Lisa Roca, MD as Attending Physician (Emergency Medicine) Hessie Knows, MD as Consulting Physician (Orthopedic Surgery) Renata Caprice as Physician Assistant (Orthopedic Surgery) Philomena Course as Physician Assistant (Emergency Medicine)  Review of Systems  Constitutional: Positive for appetite change and diaphoresis. Negative for activity change, chills, fever and unexpected weight change.  HENT: Positive for hearing loss. Negative for congestion, dental problem, drooling, ear discharge, ear pain and facial swelling.   Respiratory: Positive for shortness of breath. Negative for apnea, cough, choking, chest tightness, wheezing and stridor.   Cardiovascular: Positive for chest pain. Negative for  palpitations and leg swelling.       He denies any active cardiology symptoms and he is seeing cardiology and has follow-ups.  Endocrine: Positive for cold intolerance, polydipsia and polyuria. Negative for heat intolerance and polyphagia.  Genitourinary: Positive for genital sores, scrotal swelling and testicular pain. Negative for decreased urine volume, difficulty urinating, discharge, dysuria, enuresis, flank pain, frequency, hematuria, penile pain, penile swelling and urgency.  Musculoskeletal: Positive for arthralgias, back pain, joint swelling and neck pain. Negative for gait problem, myalgias and neck stiffness.  Skin: Positive for wound. Negative for color change, pallor and rash.  Neurological: Positive for dizziness, tremors and light-headedness. Negative for seizures, syncope, facial asymmetry, speech difficulty, weakness, numbness and headaches.  Hematological: Negative for adenopathy. Does not bruise/bleed easily.  Psychiatric/Behavioral: Positive for behavioral problems, decreased concentration, dysphoric mood and sleep disturbance. Negative for agitation, confusion, hallucinations, self-injury and suicidal ideas. The patient is nervous/anxious. The patient is not hyperactive.        He reports he does see psychiatry at [facility].  He was advised to keep his follow-ups for those medications.  All other systems reviewed and are negative.     Objective    BP 116/70   Pulse (!) 57   Temp (!) 96.2 F (35.7 C) (Oral)   Resp 16   Ht 6\' 3"  (1.905 m)   Wt 224 lb 9.6 oz (101.9 kg)   SpO2 98%   BMI 28.07 kg/m  Physical Exam Exam conducted with a chaperone present.  Constitutional:      General: He is not in acute distress.    Appearance: Normal appearance.  He is not ill-appearing, toxic-appearing or diaphoretic.     Comments: Patient is alert and oriented and responsive to questions Engages in eye contact with provider. Speaks in full sentences without any pauses without any  shortness of breath or distress.   HENT:     Head: Normocephalic and atraumatic.  Neck:     Vascular: No carotid bruit.  Cardiovascular:     Rate and Rhythm: Normal rate and regular rhythm.     Pulses: Normal pulses.     Heart sounds: Normal heart sounds. No murmur. No friction rub. No gallop.   Pulmonary:     Effort: Pulmonary effort is normal. No respiratory distress.     Breath sounds: Normal breath sounds. No stridor. No wheezing, rhonchi or rales.  Chest:     Chest wall: No tenderness.  Abdominal:     General: There is no distension.     Palpations: Abdomen is soft. There is no mass.     Tenderness: There is abdominal tenderness in the right lower quadrant and left lower quadrant. There is no right CVA tenderness, left CVA tenderness, guarding or rebound.     Hernia: No hernia is present.     Comments: Mild tenderness he reports with deep palpation bilateral lower quadrants   Musculoskeletal:     Right shoulder: Normal.     Left shoulder: Normal.     Cervical back: Normal, normal range of motion and neck supple. No rigidity or tenderness.     Thoracic back: Normal.     Lumbar back: Tenderness present.       Back:  Lymphadenopathy:     Cervical: No cervical adenopathy.  Skin:    General: Skin is warm and dry.     Capillary Refill: Capillary refill takes less than 2 seconds.     Findings: No erythema or rash.  Neurological:     General: No focal deficit present.     Mental Status: He is alert and oriented to person, place, and time.  Psychiatric:        Mood and Affect: Mood normal.        Behavior: Behavior normal.        Thought Content: Thought content normal.        Judgment: Judgment normal.     Depression Screen PHQ 2/9 Scores 07/08/2019 06/03/2019 05/10/2018 04/12/2018  PHQ - 2 Score 0 2 0 0  PHQ- 9 Score - 9 - 6  Exception Documentation - - - -   No results found for any visits on 12/12/19.  Assessment & Plan      Chronic hepatitis C without hepatic  coma (HCC)  Screening for blood or protein in urine - Plan: POCT urinalysis dipstick, CANCELED: Urinalysis, Routine w reflex microscopic  Potential exposure to STD - Plan: Urine cytology ancillary only  Cocaine use - Plan: Pain Mgt Scrn (14 Drugs), Ur  Marijuana smoker  Other cirrhosis of liver ([place])  Hepatoma ([place])  Lower abdominal pain - Plan: Ambulatory referral to Gastroenterology  Foot pain, bilateral  Severe recurrent major depression without psychotic features ([place])  Coronary artery disease of native artery of native heart with stable angina pectoris ([place])  Schizoaffective disorder, bipolar type ([place]) - Plan: Ambulatory referral to Psychiatry  Alcohol use disorder, moderate, dependence ([place])  DDD (degenerative disc disease), lumbar  Cervical facet syndrome  Benign prostatic hyperplasia without lower urinary tract symptoms - Plan: PSA  Chronic neck pain ([place]) ([place])  Heart valve disorder  History of lumbar surgery  Anxiety  Chronic pain of right knee  Screening cholesterol level - Plan: Lipid panel  Fatigue, unspecified type - Plan: TSH  Screen for STD (sexually transmitted disease) - Plan: RPR, HIV antibody (with reflex), Hepatitis panel, acute, Hepatitis c antibody (reflex)  Penile lesion - Plan: RPR, HIV antibody (with reflex), Hepatitis panel, acute, Hepatitis c antibody (reflex), HSV(herpes simplex vrs) 1+2 ab-IgG, HSV(herpes simplex vrs) 1+2 ab-IgM  Other chronic pain - Plan: Ambulatory referral to Pain Clinic  Right testicular pain - Plan: US SCROTUM DOPPLER  Chronic bilateral low back pain without sciatica - Plan: DG Lumbar Spine 2-3 Views  Asymptomatic microscopic hematuria - Plan: Urine Microscopic  Hematuria, microscopic - Plan: Urine Microscopic, CANCELED: Urinalysis, Routine w reflex microscopic  History of COPD - Plan: DG Chest 2 View  Body mass index 28.0-28.9, adult  Tobacco use disorder, continuous  Panlobular  emphysema ([place]), Chronic No orders of the defined types were placed in this encounter.  Orders Placed This Encounter  Procedures  . US SCROTUM DOPPLER    Standing Status:   Future    Standing Expiration Date:   02/10/2021    Scheduling Instructions:     Within 24 to 48 hours if able to schedule    Order Specific Question:   Reason for Exam (SYMPTOM  OR DIAGNOSIS REQUIRED)    Answer:   right testicular swelling and pain for two weeks ago, rule out other etiology    Order Specific Question:   Preferred imaging location?    Answer:   [facility]  . DG Lumbar Spine 2-3 Views    Standing Status:   Future    Standing Expiration Date:   02/10/2021    Order Specific Question:   Reason for Exam (SYMPTOM  OR DIAGNOSIS REQUIRED)    Answer:   history reported lumbar surgery    Order Specific Question:   Preferred imaging location?    Answer:   [facility]    Order Specific Question:   Radiology Contrast Protocol - do NOT remove file path    Answer:   \\[email or web address].[email or web address]  . DG Chest 2 View    Standing Status:   Future    Standing Expiration Date:   02/10/2021    Order Specific Question:   Reason for Exam (SYMPTOM  OR DIAGNOSIS REQUIRED)    Answer:   history COPD -    Order Specific Question:   Preferred imaging location?    Answer:   [facility]    Order Specific Question:   Radiology Contrast Protocol - do NOT remove file path    Answer:   \\[email or web address].pdf  . Lipid panel  . TSH  . Pain Mgt Scrn (14 Drugs), Ur  . PSA  . RPR  . HIV antibody (with reflex)  . Hepatitis panel, acute  . Hepatitis c antibody (reflex)  . HSV(herpes simplex vrs) 1+2 ab-IgG  . HSV(herpes simplex vrs) 1+2 ab-IgM  . Urine Microscopic  . Ambulatory referral to Gastroenterology    Referral Priority:   Routine    Referral Type:   Consultation    Referral Reason:   Specialty Services Required    Number of  Visits Requested:   1  . Ambulatory referral to Psychiatry    Referral Priority:   Routine    Referral Type:   Psychiatric    Referral Reason:   Specialty Services Required    Requested Specialty:   Psychiatry    Number of Visits Requested:  1  . Ambulatory referral to Pain Clinic    Referral Priority:   Routine    Referral Type:   Consultation    Referral Reason:   Specialty Services Required    Requested Specialty:   Pain Medicine    Number of Visits Requested:   1  . POCT urinalysis dipstick   Needs CPE scheduled.  Will await labs.    Results for orders placed or performed in visit on 12/12/19 (from the past 24 hour(s))  POCT urinalysis dipstick     Status: Abnormal   Collection Time: 12/12/19  9:05 AM  Result Value Ref Range   Color, UA yellow    Clarity, UA clear    Glucose, UA Negative Negative   Bilirubin, UA negative    Ketones, UA negative    Spec Grav, UA 1.010 1.010 - 1.025   Blood, UA non hemolyzed moderate    pH, UA 5.0 5.0 - 8.0   Protein, UA Negative Negative   Urobilinogen, UA 0.2 0.2 or 1.0 E.U./dL   Nitrite, UA negative    Leukocytes, UA Negative Negative   Appearance     Odor      Addressed acute and or chronic medical problems today requiring 55 minutes reviewing patients medical record,labs, counseling patient regarding patient's conditions, any medications, answering questions regarding health, and coordination of care as needed. After visit summary patient given copy and reviewed.    Return in about 2 weeks (around 12/26/2019), or if symptoms worsen or fail to improve, for at any time for any worsening symptoms, Go to Emergency room/ urgent care if worse.  [place] Nathaniel Yaden, FNP, have reviewed all documentation for this visit. The documentation on 12/12/19 for the exam, diagnosis, procedures, and orders are all accurate and complete.   Marcille Buffy, [place] [phone] (phone) [phone]  (fax)  [place]

## 2019-12-12 ENCOUNTER — Encounter: Payer: Self-pay | Admitting: Adult Health

## 2019-12-12 ENCOUNTER — Other Ambulatory Visit (HOSPITAL_COMMUNITY)
Admission: RE | Admit: 2019-12-12 | Discharge: 2019-12-12 | Disposition: A | Discharge status: Home / Self Care | Payer: Medicaid Other | Source: Ambulatory Visit | Attending: Adult Health | Admitting: Adult Health

## 2019-12-12 ENCOUNTER — Other Ambulatory Visit: Payer: Self-pay

## 2019-12-12 ENCOUNTER — Ambulatory Visit (INDEPENDENT_AMBULATORY_CARE_PROVIDER_SITE_OTHER): Payer: Medicaid Other | Admitting: Adult Health

## 2019-12-12 VITALS — BP 116/70 | HR 57 | Temp 96.2°F | Resp 16 | Ht 75.0 in | Wt 224.6 lb

## 2019-12-12 DIAGNOSIS — K7469 Other cirrhosis of liver: Secondary | ICD-10-CM

## 2019-12-12 DIAGNOSIS — F1290 Cannabis use, unspecified, uncomplicated: Secondary | ICD-10-CM

## 2019-12-12 DIAGNOSIS — R5383 Other fatigue: Secondary | ICD-10-CM

## 2019-12-12 DIAGNOSIS — M79671 Pain in right foot: Secondary | ICD-10-CM

## 2019-12-12 DIAGNOSIS — F17209 Nicotine dependence, unspecified, with unspecified nicotine-induced disorders: Secondary | ICD-10-CM | POA: Insufficient documentation

## 2019-12-12 DIAGNOSIS — M25561 Pain in right knee: Secondary | ICD-10-CM

## 2019-12-12 DIAGNOSIS — C220 Liver cell carcinoma: Secondary | ICD-10-CM

## 2019-12-12 DIAGNOSIS — R3129 Other microscopic hematuria: Secondary | ICD-10-CM

## 2019-12-12 DIAGNOSIS — F1020 Alcohol dependence, uncomplicated: Secondary | ICD-10-CM

## 2019-12-12 DIAGNOSIS — F1490 Cocaine use, unspecified, uncomplicated: Secondary | ICD-10-CM

## 2019-12-12 DIAGNOSIS — Z9889 Other specified postprocedural states: Secondary | ICD-10-CM

## 2019-12-12 DIAGNOSIS — Z202 Contact with and (suspected) exposure to infections with a predominantly sexual mode of transmission: Secondary | ICD-10-CM

## 2019-12-12 DIAGNOSIS — M79672 Pain in left foot: Secondary | ICD-10-CM

## 2019-12-12 DIAGNOSIS — Z113 Encounter for screening for infections with a predominantly sexual mode of transmission: Secondary | ICD-10-CM

## 2019-12-12 DIAGNOSIS — R1030 Lower abdominal pain, unspecified: Secondary | ICD-10-CM

## 2019-12-12 DIAGNOSIS — Z8709 Personal history of other diseases of the respiratory system: Secondary | ICD-10-CM

## 2019-12-12 DIAGNOSIS — F332 Major depressive disorder, recurrent severe without psychotic features: Secondary | ICD-10-CM

## 2019-12-12 DIAGNOSIS — B182 Chronic viral hepatitis C: Secondary | ICD-10-CM | POA: Diagnosis not present

## 2019-12-12 DIAGNOSIS — F250 Schizoaffective disorder, bipolar type: Secondary | ICD-10-CM

## 2019-12-12 DIAGNOSIS — Z1389 Encounter for screening for other disorder: Secondary | ICD-10-CM | POA: Diagnosis not present

## 2019-12-12 DIAGNOSIS — N50811 Right testicular pain: Secondary | ICD-10-CM | POA: Insufficient documentation

## 2019-12-12 DIAGNOSIS — M5136 Other intervertebral disc degeneration, lumbar region: Secondary | ICD-10-CM

## 2019-12-12 DIAGNOSIS — Z13220 Encounter for screening for lipoid disorders: Secondary | ICD-10-CM

## 2019-12-12 DIAGNOSIS — J431 Panlobular emphysema: Secondary | ICD-10-CM

## 2019-12-12 DIAGNOSIS — F419 Anxiety disorder, unspecified: Secondary | ICD-10-CM

## 2019-12-12 DIAGNOSIS — I25118 Atherosclerotic heart disease of native coronary artery with other forms of angina pectoris: Secondary | ICD-10-CM

## 2019-12-12 DIAGNOSIS — M47812 Spondylosis without myelopathy or radiculopathy, cervical region: Secondary | ICD-10-CM

## 2019-12-12 DIAGNOSIS — I38 Endocarditis, valve unspecified: Secondary | ICD-10-CM

## 2019-12-12 DIAGNOSIS — N489 Disorder of penis, unspecified: Secondary | ICD-10-CM | POA: Insufficient documentation

## 2019-12-12 DIAGNOSIS — M545 Other chronic pain: Secondary | ICD-10-CM

## 2019-12-12 DIAGNOSIS — M542 Cervicalgia: Secondary | ICD-10-CM

## 2019-12-12 DIAGNOSIS — G8929 Other chronic pain: Secondary | ICD-10-CM

## 2019-12-12 DIAGNOSIS — R3121 Asymptomatic microscopic hematuria: Secondary | ICD-10-CM

## 2019-12-12 DIAGNOSIS — N400 Enlarged prostate without lower urinary tract symptoms: Secondary | ICD-10-CM

## 2019-12-12 DIAGNOSIS — Z6828 Body mass index (BMI) 28.0-28.9, adult: Secondary | ICD-10-CM | POA: Insufficient documentation

## 2019-12-12 LAB — POCT URINALYSIS DIPSTICK
Bilirubin, UA: NEGATIVE
Glucose, UA: NEGATIVE
Ketones, UA: NEGATIVE
Leukocytes, UA: NEGATIVE
Nitrite, UA: NEGATIVE
Protein, UA: NEGATIVE
Spec Grav, UA: 1.01 (ref 1.010–1.025)
Urobilinogen, UA: 0.2 E.U./dL
pH, UA: 5 (ref 5.0–8.0)

## 2019-12-12 NOTE — Progress Notes (Signed)
Send for microscopic. Also testing for urine cytology STD's

## 2019-12-12 NOTE — Patient Instructions (Addendum)
Orchitis  Orchitis is inflammation of a testicle. Testicles are the male organs that produce sperm. The testicles are held in a fleshy sac (scrotum) located behind the penis. Orchitis usually affects only one testicle, but it can affect both. Orchitis is caused by infection. Many kinds of bacteria and viruses can cause this infection. The condition can develop suddenly. What are the causes? This condition may be caused by:  Infection from viruses or bacteria.  Other organisms, such as fungi or parasites (rare). This is common in men who have a weak body defense system (immune system), such as men with HIV. Bacteria   Bacterial orchitis often occurs along with an infection of the tube that collects and stores sperm (epididymis).  In men who are not sexually active, this infection usually starts as a urinary tract infection and spreads to the testicle.  In sexually active men, sexually transmitted infections (STIs) are the most common cause of bacterial orchitis. These can include: ? Gonorrhea. ? Chlamydia. Viruses  Mumps is the most common cause of viral orchitis, though mumps is now rare in many areas because of vaccination.  Other viruses that can cause orchitis include: ? The chickenpox virus (varicella-zoster virus). ? The virus that causes mononucleosis (Epstein-Barr virus). What increases the risk? The following factors may make you more likely to develop this condition:  For viral orchitis: ? Not having been vaccinated against mumps.  For bacterial orchitis: ? Having had frequent urinary tract infections. ? Engaging in high-risk sexual behaviors, such as having multiple sexual partners or having sex without using a condom. ? Having a sexual partner with an STI. ? Having had urinary tract surgery. ? Using a tube that is passed through the penis to drain urine (Foley catheter). ? Having an enlarged prostate gland. What are the signs or symptoms? The most common symptoms of  orchitis are swelling and pain in the scrotum. Other signs and symptoms may include:  Feeling generally sick (malaise).  Fever and chills.  Painful urination.  Painful ejaculation.  Headache.  Fatigue.  Nausea.  Blood or discharge from the penis.  Swollen lymph nodes in the groin area (inguinal nodes). How is this diagnosed? This condition may be diagnosed based on:  Your symptoms. Your health care provider may suspect orchitis if you have a painful, swollen testicle along with other signs and symptoms of the condition.  A physical exam. You may also have other tests, including:  A blood test to check for signs of infection.  A urine test to check for a urinary tract infection or STI.  Using a swab to collect a fluid sample from the tip of the penis to test for STIs.  Taking an image of the testicle using sound waves and a computer (testicular ultrasound). How is this treated? Treatment for this condition depends on the cause.  For bacterial orchitis, your health care provider may prescribe antibiotic medicines. Bacterial infections usually clear up within a few days. For both viral infections and bacterial infections, you may be treated with:  Rest.  Anti-inflammatory medicines.  Pain medicines.  Raising (elevating) the scrotum with a towel or pillow underneath and applying ice. Follow these instructions at home:  Rest as directed by your health care provider.  Take over-the-counter and prescription medicines only as told by your health care provider.  If you were prescribed an antibiotic medicine, take it as told by your health care provider. Do not stop taking the antibiotic even if you start to feel better.  Do not have sex until your health care provider says it is okay to do so.  Elevate your scrotum and apply ice as directed: ? Put ice in a plastic bag. ? Place a small towel or pillow between your legs. ? Rest your scrotum on the pillow or  towel. ? Place another towel between your skin and the plastic bag. ? Leave the ice on for 20 minutes, 2-3 times a day.  Keep all follow-up visits as told by your health care provider. This is important. Contact a health care provider if:  You have a fever.  Pain and swelling have not gotten better after 3 days. Get help right away if:  Your pain is getting worse.  The swelling in your testicle gets worse. Summary  Orchitis is inflammation of a testicle. It is caused by an infection from bacteria or a virus.  The most common symptoms of orchitis are swelling and pain in the scrotum.  Treatment for this condition depends on the cause. It may include medicines to fight the infection, reduce inflammation, and relieve the pain.  Follow your health care provider's instructions about resting, icing, not having sex, and taking medicines. This information is not intended to replace advice given to you by your health care provider. Make sure you discuss any questions you have with your health care provider. Document Revised: 07/28/2017 Document Reviewed: 07/28/2017 Elsevier Patient Education  2020 [place] Risks of Smoking Smoking cigarettes is very bad for your health. Tobacco smoke has over 200 known poisons in it. It contains the poisonous gases nitrogen oxide and carbon monoxide. There are over 60 chemicals in tobacco smoke that cause cancer. Smoking is difficult to quit because a chemical in tobacco, called nicotine, causes addiction or dependence. When you smoke and inhale, nicotine is absorbed rapidly into the bloodstream through your lungs. Both inhaled and non-inhaled nicotine may be addictive. What are the risks of cigarette smoke? Cigarette smokers have an increased risk of many serious medical problems, including:  Lung cancer.  Lung disease, such as pneumonia, bronchitis, and emphysema.  Chest pain (angina) and heart attack because the heart is not getting enough  oxygen.  Heart disease and peripheral blood vessel disease.  High blood pressure (hypertension).  Stroke.  Oral cancer, including cancer of the lip, mouth, or voice box.  Bladder cancer.  Pancreatic cancer.  Cervical cancer.  Pregnancy complications, including premature birth.  Stillbirths and smaller newborn babies, birth defects, and genetic damage to sperm.  Early menopause.  Lower estrogen level for women.  Infertility.  Facial wrinkles.  Blindness.  Increased risk of broken bones (fractures).  Senile dementia.  Stomach ulcers and internal bleeding.  Delayed wound healing and increased risk of complications during surgery.  Even smoking lightly shortens your life expectancy by several years. Because of secondhand smoke exposure, children of smokers have an increased risk of the following:  Sudden infant death syndrome (SIDS).  Respiratory infections.  Lung cancer.  Heart disease.  Ear infections. What are the benefits of quitting? There are many health benefits of quitting smoking. Here are some of them:  Within days of quitting smoking, your risk of having a heart attack decreases, your blood flow improves, and your lung capacity improves. Blood pressure, pulse rate, and breathing patterns start returning to normal soon after quitting.  Within months, your lungs may clear up completely.  Quitting for 10 years reduces your risk of developing lung cancer and heart disease to almost that of a  nonsmoker.  People who quit may see an improvement in their overall quality of life. How do I quit smoking?     Smoking is an addiction with both physical and psychological effects, and longtime habits can be hard to change. Your health care provider can recommend:  Programs and community resources, which may include group support, education, or talk therapy.  Prescription medicines to help reduce cravings.  Nicotine replacement products, such as patches,  gum, and nasal sprays. Use these products only as directed. Do not replace cigarette smoking with electronic cigarettes, which are commonly called e-cigarettes. The safety of e-cigarettes is not known, and some may contain harmful chemicals.  A combination of two or more of these methods. Where to find more information  American Lung Association: www.lung.org  American Cancer Society: www.cancer.org Summary  Smoking cigarettes is very bad for your health. Cigarette smokers have an increased risk of many serious medical problems, including several cancers, heart disease, and stroke.  Smoking is an addiction with both physical and psychological effects, and longtime habits can be hard to change.  By stopping right away, you can greatly reduce the risk of medical problems for you and your family.  To help you quit smoking, your health care provider can recommend programs, community resources, prescription medicines, and nicotine replacement products such as patches, gum, and nasal sprays. This information is not intended to replace advice given to you by your health care provider. Make sure you discuss any questions you have with your health care provider. Document Revised: 10/12/2017 Document Reviewed: 07/15/2016 Elsevier Patient Education  2020 [place] Maintenance, Male Adopting a healthy lifestyle and getting preventive care are important in promoting health and wellness. Ask your health care provider about:  The right schedule for you to have regular tests and exams.  Things you can do on your own to prevent diseases and keep yourself healthy. What should I know about diet, weight, and exercise? Eat a healthy diet   Eat a diet that includes plenty of vegetables, fruits, low-fat dairy products, and lean protein.  Do not eat a lot of foods that are high in solid fats, added sugars, or sodium. Maintain a healthy weight Body mass index (BMI) is a measurement that can be  used to identify possible weight problems. It estimates body fat based on height and weight. Your health care provider can help determine your BMI and help you achieve or maintain a healthy weight. Get regular exercise Get regular exercise. This is one of the most important things you can do for your health. Most adults should:  Exercise for at least 150 minutes each week. The exercise should increase your heart rate and make you sweat (moderate-intensity exercise).  Do strengthening exercises at least twice a week. This is in addition to the moderate-intensity exercise.  Spend less time sitting. Even light physical activity can be beneficial. Watch cholesterol and blood lipids Have your blood tested for lipids and cholesterol at 59 years of age, then have this test every 5 years. You may need to have your cholesterol levels checked more often if:  Your lipid or cholesterol levels are high.  You are older than 59 years of age.  You are at high risk for heart disease. What should I know about cancer screening? Many types of cancers can be detected early and may often be prevented. Depending on your health history and family history, you may need to have cancer screening at various ages. This may include screening  for:  Colorectal cancer.  Prostate cancer.  Skin cancer.  Lung cancer. What should I know about heart disease, diabetes, and high blood pressure? Blood pressure and heart disease  High blood pressure causes heart disease and increases the risk of stroke. This is more likely to develop in people who have high blood pressure readings, are of African descent, or are overweight.  Talk with your health care provider about your target blood pressure readings.  Have your blood pressure checked: ? Every 3-5 years if you are 31-68 years of age. ? Every year if you are 75 years old or older.  If you are between the ages of 41 and 58 and are a current or former smoker, ask your  health care provider if you should have a one-time screening for abdominal aortic aneurysm (AAA). Diabetes Have regular diabetes screenings. This checks your fasting blood sugar level. Have the screening done:  Once every three years after age 30 if you are at a normal weight and have a low risk for diabetes.  More often and at a younger age if you are overweight or have a high risk for diabetes. What should I know about preventing infection? Hepatitis B If you have a higher risk for hepatitis B, you should be screened for this virus. Talk with your health care provider to find out if you are at risk for hepatitis B infection. Hepatitis C Blood testing is recommended for:  Everyone born from [birth year] through 1965.  Anyone with known risk factors for hepatitis C. Sexually transmitted infections (STIs)  You should be screened each year for STIs, including gonorrhea and chlamydia, if: ? You are sexually active and are younger than 59 years of age. ? You are older than 59 years of age and your health care provider tells you that you are at risk for this type of infection. ? Your sexual activity has changed since you were last screened, and you are at increased risk for chlamydia or gonorrhea. Ask your health care provider if you are at risk.  Ask your health care provider about whether you are at high risk for HIV. Your health care provider may recommend a prescription medicine to help prevent HIV infection. If you choose to take medicine to prevent HIV, you should first get tested for HIV. You should then be tested every 3 months for as long as you are taking the medicine. Follow these instructions at home: Lifestyle  Do not use any products that contain nicotine or tobacco, such as cigarettes, e-cigarettes, and chewing tobacco. If you need help quitting, ask your health care provider.  Do not use street drugs.  Do not share needles.  Ask your health care provider for help if you need  support or information about quitting drugs. Alcohol use  Do not drink alcohol if your health care provider tells you not to drink.  If you drink alcohol: ? Limit how much you have to 0-2 drinks a day. ? Be aware of how much alcohol is in your drink. In the [place] one drink equals one 12 oz bottle of beer (355 mL), one 5 oz glass of wine (148 mL), or one 1 oz glass of hard liquor (44 mL). General instructions  Schedule regular health, dental, and eye exams.  Stay current with your vaccines.  Tell your health care provider if: ? You often feel depressed. ? You have ever been abused or do not feel safe at home. Summary  Adopting a healthy lifestyle  and getting preventive care are important in promoting health and wellness.  Follow your health care provider's instructions about healthy diet, exercising, and getting tested or screened for diseases.  Follow your health care provider's instructions on monitoring your cholesterol and blood pressure. This information is not intended to replace advice given to you by your health care provider. Make sure you discuss any questions you have with your health care provider. Document Revised: 07/04/2018 Document Reviewed: 07/04/2018 Elsevier Patient Education  2020 [place] for [employer] Calories are units of energy. Your body needs a certain amount of calories from food to keep you going throughout the day. When you eat more calories than your body needs, your body stores the extra calories as fat. When you eat fewer calories than your body needs, your body burns fat to get the energy it needs. Calorie counting means keeping track of how many calories you eat and drink each day. Calorie counting can be helpful if you need to lose weight. If you make sure to eat fewer calories than your body needs, you should lose weight. Ask your health care provider what a healthy weight is for you. For calorie counting to work, you will  need to eat the right number of calories in a day in order to lose a healthy amount of weight per week. A dietitian can help you determine how many calories you need in a day and will give you suggestions on how to reach your calorie goal.  A healthy amount of weight to lose per week is usually 1-2 lb (0.5-0.9 kg). This usually means that your daily calorie intake should be reduced by 500-750 calories.  Eating 1,200 - 1,500 calories per day can help most women lose weight.  Eating 1,500 - 1,800 calories per day can help most men lose weight. What is my plan? My goal is to have __________ calories per day. If I have this many calories per day, I should lose around __________ pounds per week. What do I need to know about calorie counting? In order to meet your daily calorie goal, you will need to:  Find out how many calories are in each food you would like to eat. Try to do this before you eat.  Decide how much of the food you plan to eat.  Write down what you ate and how many calories it had. Doing this is called keeping a food log. To successfully lose weight, it is important to balance calorie counting with a healthy lifestyle that includes regular activity. Aim for 150 minutes of moderate exercise (such as walking) or 75 minutes of vigorous exercise (such as running) each week. Where do I find calorie information?  The number of calories in a food can be found on a Nutrition Facts label. If a food does not have a Nutrition Facts label, try to look up the calories online or ask your dietitian for help. Remember that calories are listed per serving. If you choose to have more than one serving of a food, you will have to multiply the calories per serving by the amount of servings you plan to eat. For example, the label on a package of bread might say that a serving size is 1 slice and that there are 90 calories in a serving. If you eat 1 slice, you will have eaten 90 calories. If you eat 2  slices, you will have eaten 180 calories. How do I keep a food log?  Immediately after each meal, record the following information in your food log:  What you ate. Don't forget to include toppings, sauces, and other extras on the food.  How much you ate. This can be measured in cups, ounces, or number of items.  How many calories each food and drink had.  The total number of calories in the meal. Keep your food log near you, such as in a small notebook in your pocket, or use a mobile app or website. Some programs will calculate calories for you and show you how many calories you have left for the day to meet your goal. What are some calorie counting tips?   Use your calories on foods and drinks that will fill you up and not leave you hungry: ? Some examples of foods that fill you up are nuts and nut butters, vegetables, lean proteins, and high-fiber foods like whole grains. High-fiber foods are foods with more than 5 g fiber per serving. ? Drinks such as sodas, specialty coffee drinks, alcohol, and juices have a lot of calories, yet do not fill you up.  Eat nutritious foods and avoid empty calories. Empty calories are calories you get from foods or beverages that do not have many vitamins or protein, such as candy, sweets, and soda. It is better to have a nutritious high-calorie food (such as an avocado) than a food with few nutrients (such as a bag of chips).  Know how many calories are in the foods you eat most often. This will help you calculate calorie counts faster.  Pay attention to calories in drinks. Low-calorie drinks include water and unsweetened drinks.  Pay attention to nutrition labels for "low fat" or "fat free" foods. These foods sometimes have the same amount of calories or more calories than the full fat versions. They also often have added sugar, starch, or salt, to make up for flavor that was removed with the fat.  Find a way of tracking calories that works for you. Get  creative. Try different apps or programs if writing down calories does not work for you. What are some portion control tips?  Know how many calories are in a serving. This will help you know how many servings of a certain food you can have.  Use a measuring cup to measure serving sizes. You could also try weighing out portions on a kitchen scale. With time, you will be able to estimate serving sizes for some foods.  Take some time to put servings of different foods on your favorite plates, bowls, and cups so you know what a serving looks like.  Try not to eat straight from a bag or box. Doing this can lead to overeating. Put the amount you would like to eat in a cup or on a plate to make sure you are eating the right portion.  Use smaller plates, glasses, and bowls to prevent overeating.  Try not to multitask (for example, watch TV or use your computer) while eating. If it is time to eat, sit down at a table and enjoy your food. This will help you to know when you are full. It will also help you to be aware of what you are eating and how much you are eating. What are tips for following this plan? Reading food labels  Check the calorie count compared to the serving size. The serving size may be smaller than what you are used to eating.  Check the source of the calories. Make sure the  food you are eating is high in vitamins and protein and low in saturated and trans fats. Shopping  Read nutrition labels while you shop. This will help you make healthy decisions before you decide to purchase your food.  Make a grocery list and stick to it. Cooking  Try to cook your favorite foods in a healthier way. For example, try baking instead of frying.  Use low-fat dairy products. Meal planning  Use more fruits and vegetables. Half of your plate should be fruits and vegetables.  Include lean proteins like poultry and fish. How do I count calories when eating out?  Ask for smaller portion  sizes.  Consider sharing an entree and sides instead of getting your own entree.  If you get your own entree, eat only half. Ask for a box at the beginning of your meal and put the rest of your entree in it so you are not tempted to eat it.  If calories are listed on the menu, choose the lower calorie options.  Choose dishes that include vegetables, fruits, whole grains, low-fat dairy products, and lean protein.  Choose items that are boiled, broiled, grilled, or steamed. Stay away from items that are buttered, battered, fried, or served with cream sauce. Items labeled "crispy" are usually fried, unless stated otherwise.  Choose water, low-fat milk, unsweetened iced tea, or other drinks without added sugar. If you want an alcoholic beverage, choose a lower calorie option such as a glass of wine or light beer.  Ask for dressings, sauces, and syrups on the side. These are usually high in calories, so you should limit the amount you eat.  If you want a salad, choose a garden salad and ask for grilled meats. Avoid extra toppings like bacon, cheese, or fried items. Ask for the dressing on the side, or ask for olive oil and vinegar or lemon to use as dressing.  Estimate how many servings of a food you are given. For example, a serving of cooked rice is  cup or about the size of half a baseball. Knowing serving sizes will help you be aware of how much food you are eating at restaurants. The list below tells you how big or small some common portion sizes are based on everyday objects: ? 1 oz--4 stacked dice. ? 3 oz--1 deck of cards. ? 1 tsp--1 die. ? 1 Tbsp-- a ping-pong ball. ? 2 Tbsp--1 ping-pong ball. ?  cup-- baseball. ? 1 cup--1 baseball. Summary  Calorie counting means keeping track of how many calories you eat and drink each day. If you eat fewer calories than your body needs, you should lose weight.  A healthy amount of weight to lose per week is usually 1-2 lb (0.5-0.9 kg). This  usually means reducing your daily calorie intake by 500-750 calories.  The number of calories in a food can be found on a Nutrition Facts label. If a food does not have a Nutrition Facts label, try to look up the calories online or ask your dietitian for help.  Use your calories on foods and drinks that will fill you up, and not on foods and drinks that will leave you hungry.  Use smaller plates, glasses, and bowls to prevent overeating. This information is not intended to replace advice given to you by your health care provider. Make sure you discuss any questions you have with your health care provider. Document Revised: 03/30/2018 Document Reviewed: 06/10/2016 Elsevier Patient Education  2020 [place] and  Cholesterol Restricted Eating Plan Getting too much fat and cholesterol in your diet may cause health problems. Choosing the right foods helps keep your fat and cholesterol at normal levels. This can keep you from getting certain diseases. Your doctor may recommend an eating plan that includes:  Total fat: ______% or less of total calories a day.  Saturated fat: ______% or less of total calories a day.  Cholesterol: less than _________mg a day.  Fiber: ______g a day. What are tips for following this plan? Meal planning  At meals, divide your plate into four equal parts: ? Fill one-half of your plate with vegetables and green salads. ? Fill one-fourth of your plate with whole grains. ? Fill one-fourth of your plate with low-fat (lean) protein foods.  Eat fish that is high in omega-3 fats at least two times a week. This includes mackerel, tuna, sardines, and salmon.  Eat foods that are high in fiber, such as whole grains, beans, apples, broccoli, carrots, peas, and barley. General tips   Work with your doctor to lose weight if you need to.  Avoid: ? Foods with added sugar. ? Fried foods. ? Foods with partially hydrogenated oils.  Limit alcohol intake to no  more than 1 drink a day for nonpregnant women and 2 drinks a day for men. One drink equals 12 oz of beer, 5 oz of wine, or 1 oz of hard liquor. Reading food labels  Check food labels for: ? Trans fats. ? Partially hydrogenated oils. ? Saturated fat (g) in each serving. ? Cholesterol (mg) in each serving. ? Fiber (g) in each serving.  Choose foods with healthy fats, such as: ? Monounsaturated fats. ? Polyunsaturated fats. ? Omega-3 fats.  Choose grain products that have whole grains. Look for the word "whole" as the first word in the ingredient list. Cooking  Cook foods using low-fat methods. These include baking, boiling, grilling, and broiling.  Eat more home-cooked foods. Eat at restaurants and buffets less often.  Avoid cooking using saturated fats, such as butter, cream, palm oil, palm kernel oil, and coconut oil. Recommended foods  Fruits  All fresh, canned (in natural juice), or frozen fruits. Vegetables  Fresh or frozen vegetables (raw, steamed, roasted, or grilled). Green salads. Grains  Whole grains, such as whole wheat or whole grain breads, crackers, cereals, and pasta. Unsweetened oatmeal, bulgur, barley, quinoa, or brown rice. Corn or whole wheat flour tortillas. Meats and other protein foods  Ground beef (85% or leaner), grass-fed beef, or beef trimmed of fat. Skinless chicken or [place]. Ground chicken or [place]. Pork trimmed of fat. All fish and seafood. Egg whites. Dried beans, peas, or lentils. Unsalted nuts or seeds. Unsalted canned beans. Nut butters without added sugar or oil. Dairy  Low-fat or nonfat dairy products, such as skim or 1% milk, 2% or reduced-fat cheeses, low-fat and fat-free ricotta or cottage cheese, or plain low-fat and nonfat yogurt. Fats and oils  Tub margarine without trans fats. Light or reduced-fat mayonnaise and salad dressings. Avocado. Olive, canola, sesame, or safflower oils. The items listed above may not be a complete list of  foods and beverages you can eat. Contact a dietitian for more information. Foods to avoid Fruits  Canned fruit in heavy syrup. Fruit in cream or butter sauce. Fried fruit. Vegetables  Vegetables cooked in cheese, cream, or butter sauce. Fried vegetables. Grains  White bread. White pasta. White rice. Cornbread. Bagels, pastries, and croissants. Crackers and snack foods that contain trans fat and  hydrogenated oils. Meats and other protein foods  Fatty cuts of meat. Ribs, chicken wings, bacon, sausage, bologna, salami, chitterlings, fatback, hot dogs, bratwurst, and packaged lunch meats. Liver and organ meats. Whole eggs and egg yolks. Chicken and [place] with skin. Fried meat. Dairy  Whole or 2% milk, cream, half-and-half, and cream cheese. Whole milk cheeses. Whole-fat or sweetened yogurt. Full-fat cheeses. Nondairy creamers and whipped toppings. Processed cheese, cheese spreads, and cheese curds. Beverages  Alcohol. Sugar-sweetened drinks such as sodas, lemonade, and fruit drinks. Fats and oils  Butter, stick margarine, lard, shortening, ghee, or bacon fat. Coconut, palm kernel, and palm oils. Sweets and desserts  Corn syrup, sugars, honey, and molasses. Candy. Jam and jelly. Syrup. Sweetened cereals. Cookies, pies, cakes, donuts, muffins, and ice cream. The items listed above may not be a complete list of foods and beverages you should avoid. Contact a dietitian for more information. Summary  Choosing the right foods helps keep your fat and cholesterol at normal levels. This can keep you from getting certain diseases.  At meals, fill one-half of your plate with vegetables and green salads.  Eat high-fiber foods, like whole grains, beans, apples, carrots, peas, and barley.  Limit added sugar, saturated fats, alcohol, and fried foods. This information is not intended to replace advice given to you by your health care provider. Make sure you discuss any questions you have with your  health care provider. Document Revised: 03/14/2018 Document Reviewed: 03/28/2017 Elsevier Patient Education  [employer].

## 2019-12-13 ENCOUNTER — Telehealth: Payer: Self-pay

## 2019-12-13 ENCOUNTER — Ambulatory Visit (HOSPITAL_COMMUNITY)
Admission: RE | Admit: 2019-12-13 | Discharge: 2019-12-13 | Disposition: A | Discharge status: Home / Self Care | Payer: Medicaid Other | Source: Ambulatory Visit | Attending: Adult Health | Admitting: Adult Health

## 2019-12-13 DIAGNOSIS — N50811 Right testicular pain: Secondary | ICD-10-CM | POA: Diagnosis present

## 2019-12-13 DIAGNOSIS — I861 Scrotal varices: Secondary | ICD-10-CM

## 2019-12-13 LAB — PAIN MGT SCRN (14 DRUGS), UR
Amphetamine Scrn, Ur: NEGATIVE ng/mL
BARBITURATE SCREEN URINE: NEGATIVE ng/mL
BENZODIAZEPINE SCREEN, URINE: NEGATIVE ng/mL
Buprenorphine, Urine: NEGATIVE ng/mL
CANNABINOIDS UR QL SCN: NEGATIVE ng/mL
Cocaine (Metab) Scrn, Ur: POSITIVE ng/mL — AB
Creatinine(Crt), U: 52.8 mg/dL (ref 20.0–300.0)
Fentanyl, Urine: NEGATIVE pg/mL
Meperidine Screen, Urine: NEGATIVE ng/mL
Methadone Screen, Urine: NEGATIVE ng/mL
OXYCODONE+OXYMORPHONE UR QL SCN: NEGATIVE ng/mL
Opiate Scrn, Ur: NEGATIVE ng/mL
Ph of Urine: 5.4 (ref 4.5–8.9)
Phencyclidine Qn, Ur: NEGATIVE ng/mL
Propoxyphene Scrn, Ur: NEGATIVE ng/mL
Tramadol Screen, Urine: NEGATIVE ng/mL

## 2019-12-13 LAB — URINALYSIS, MICROSCOPIC ONLY
Bacteria, UA: NONE SEEN
Casts: NONE SEEN /lpf
Epithelial Cells (non renal): NONE SEEN /hpf (ref 0–10)
RBC, Urine: NONE SEEN /hpf (ref 0–2)
WBC, UA: NONE SEEN /hpf (ref 0–5)

## 2019-12-13 NOTE — Telephone Encounter (Signed)
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 He has had chronic pain and swelling in this testicle for over a year. Recommend referral to urology for further evaluation given this has been chronic. Testing for STD is pending however.  See if he has preference for urologist. Madaline Brilliant to place referral.

## 2019-12-13 NOTE — Progress Notes (Signed)
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 He has had chronic pain and swelling in this testicle for over a year. Recommend referral to urology for further evaluation given this has been chronic. Testing for STD is pending however.  See if he has preference for urologist. Madaline Brilliant to place referral.

## 2019-12-13 NOTE — Telephone Encounter (Signed)
Spoke with patient and advised he states that he does not have a preference for Urology office but that he only wants to see a male provider. Referral has been ordered. KW

## 2019-12-14 LAB — TSH: TSH: 1.13 u[IU]/mL (ref 0.450–4.500)

## 2019-12-14 LAB — HEPATITIS PANEL, ACUTE
Hep A IgM: NEGATIVE
Hep B C IgM: NEGATIVE
Hep C Virus Ab: >11 s/co ratio — ABNORMAL HIGH (ref 0.0–0.9)
Hepatitis B Surface Ag: NEGATIVE

## 2019-12-14 LAB — HSV(HERPES SIMPLEX VRS) I + II AB-IGG
HSV 1 Glycoprotein G Ab, IgG: 42.9 index — ABNORMAL HIGH (ref 0.00–0.90)
HSV 2 IgG, Type Spec: <0.91 index (ref 0.00–0.90)

## 2019-12-14 LAB — HIV ANTIBODY (ROUTINE TESTING W REFLEX): HIV Screen 4th Generation wRfx: NONREACTIVE

## 2019-12-14 LAB — PSA: Prostate Specific Ag, Serum: 0.7 ng/mL (ref 0.0–4.0)

## 2019-12-14 LAB — HEPATITIS C ANTIBODY (REFLEX): HCV Ab: >11 s/co ratio — ABNORMAL HIGH (ref 0.0–0.9)

## 2019-12-14 LAB — LIPID PANEL
Chol/HDL Ratio: 3.1 ratio (ref 0.0–5.0)
Cholesterol, Total: 116 mg/dL (ref 100–199)
HDL: 38 mg/dL — ABNORMAL LOW (ref >39)
LDL Chol Calc (NIH): 61 mg/dL (ref 0–99)
Triglycerides: 87 mg/dL (ref 0–149)
VLDL Cholesterol Cal: 17 mg/dL (ref 5–40)

## 2019-12-14 LAB — HSV(HERPES SIMPLEX VRS) I + II AB-IGM: HSVI/II Comb IgM: <0.91 Ratio (ref 0.00–0.90)

## 2019-12-14 LAB — COMMENT2 - HEP PANEL

## 2019-12-14 LAB — RPR: RPR Ser Ql: NONREACTIVE

## 2019-12-17 NOTE — Progress Notes (Signed)
CBC, CMP and Cholesterol within normal limits  TSH - thyroid within normal limits  PSA is within normal limits.  RPR for syphilis negative.  HIV non reactive.  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  Urine microscopic is negative.  Hepatitis C AB is positive. Viral load test pending. Who did he see in the past for this ? Was he treated ? He has already been referred to GI.

## 2019-12-17 NOTE — Progress Notes (Signed)
CBC, CMP and Cholesterol within normal limits  TSH - thyroid within normal limits  PSA is within normal limits.  RPR for syphilis negative.  HIV non reactive.  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   Urine microscopic is negative.  Hepatitis C AB is positive. Viral load test pending. Who did he see in the past for this ? Was he treated ?  He has already been referred to GI.

## 2019-12-20 ENCOUNTER — Ambulatory Visit
Admission: RE | Admit: 2019-12-20 | Discharge: 2019-12-20 | Disposition: A | Discharge status: Home / Self Care | Payer: Medicaid Other | Attending: Adult Health | Admitting: Adult Health

## 2019-12-20 ENCOUNTER — Ambulatory Visit
Admission: RE | Admit: 2019-12-20 | Discharge: 2019-12-20 | Disposition: A | Discharge status: Home / Self Care | Payer: Medicaid Other | Source: Ambulatory Visit | Attending: Adult Health | Admitting: Adult Health

## 2019-12-20 ENCOUNTER — Other Ambulatory Visit: Payer: Self-pay

## 2019-12-20 DIAGNOSIS — Z8709 Personal history of other diseases of the respiratory system: Secondary | ICD-10-CM

## 2019-12-20 DIAGNOSIS — G8929 Other chronic pain: Secondary | ICD-10-CM | POA: Insufficient documentation

## 2019-12-20 DIAGNOSIS — M545 Other chronic pain: Secondary | ICD-10-CM

## 2019-12-20 LAB — URINE CYTOLOGY ANCILLARY ONLY
Candida Urine: NEGATIVE
Chlamydia: NEGATIVE
Comment: NEGATIVE
Comment: NEGATIVE
Comment: NORMAL
Neisseria Gonorrhea: NEGATIVE
Trichomonas: NEGATIVE

## 2019-12-20 NOTE — Progress Notes (Signed)
NEGATIVE for gonorrhea, trichomonas, and chlamydia in urine, follow up in office visit if symptoms persist change or worsen at anytime

## 2019-12-24 NOTE — Progress Notes (Signed)
Chronic degenerative changes of lumbar spine. Recommended follow back up with his neurosurgeon for further work up. If needs referral please let us know. Keep pain management appointment.  RED flags of back pain include loss of bowel/ bladder control, groin numbness or worsening numbness and tingling- seek care immediately or if severe pain occurs at anytime.

## 2019-12-24 NOTE — Progress Notes (Signed)
Chronic bronchitic changes on chest x ray.  Recommend tobacco cessation, given his long term use of tobacco and changes on x ray may benefit from pulmonary evaluation- can place referral if he does not have pulmonologist. He may qualify for low dose lung scan depending on pack year history.

## 2019-12-25 ENCOUNTER — Other Ambulatory Visit: Payer: Self-pay | Admitting: Adult Health

## 2019-12-25 ENCOUNTER — Ambulatory Visit: Payer: Medicaid Other | Admitting: Urology

## 2019-12-25 ENCOUNTER — Telehealth: Payer: Self-pay

## 2019-12-25 DIAGNOSIS — F1290 Cannabis use, unspecified, uncomplicated: Secondary | ICD-10-CM

## 2019-12-25 DIAGNOSIS — M51369 Other intervertebral disc degeneration, lumbar region without mention of lumbar back pain or lower extremity pain: Secondary | ICD-10-CM

## 2019-12-25 DIAGNOSIS — F1490 Cocaine use, unspecified, uncomplicated: Secondary | ICD-10-CM

## 2019-12-25 DIAGNOSIS — M5136 Other intervertebral disc degeneration, lumbar region: Secondary | ICD-10-CM

## 2019-12-25 DIAGNOSIS — J42 Unspecified chronic bronchitis: Secondary | ICD-10-CM

## 2019-12-25 DIAGNOSIS — M25561 Pain in right knee: Secondary | ICD-10-CM

## 2019-12-25 DIAGNOSIS — Z9889 Other specified postprocedural states: Secondary | ICD-10-CM

## 2019-12-25 DIAGNOSIS — M79671 Pain in right foot: Secondary | ICD-10-CM

## 2019-12-25 DIAGNOSIS — M79672 Pain in left foot: Secondary | ICD-10-CM | POA: Insufficient documentation

## 2019-12-25 DIAGNOSIS — G8929 Other chronic pain: Secondary | ICD-10-CM

## 2019-12-25 NOTE — Addendum Note (Signed)
Addended by: Doreen Beam on: 12/25/2019 10:03 AM   Modules accepted: Orders

## 2019-12-25 NOTE — Telephone Encounter (Signed)
-----   Message from Doreen Beam, [place] sent at 12/24/2019 11:45 AM EDT ----- Chronic degenerative changes of lumbar spine. Recommended follow back up with his neurosurgeon for further work up. If needs referral please let us know. Keep pain management appointment.  RED flags of back pain include loss of bowel/ bladder control, groin numbness or worsening numbness and tingling- seek care immediately or if severe pain occurs at anytime.

## 2019-12-25 NOTE — Telephone Encounter (Signed)
Ok I will place referral to orthopedics at [facility]  As far as referrals to pain clinic- Dr. Tyna Jaksch office declined the referral due to broken pain contract in past. Referrals [facility] says Dr.Khan is reviewing now to see if they will see him.

## 2019-12-25 NOTE — Telephone Encounter (Signed)
-----   Message from Doreen Beam, [place] sent at 12/25/2019 10:03 AM EDT ----- Will place pulmonary referral.  FOr his shoulder and feet pain, I recommended orthopedics. If he is in agreement since it is chronic.  I have already referred him to [facility]. Dossie Arbour, MD he is in pain management- this was done at office visit and he should have already heard. Follow up with referrals.  Specialties and/or Subspecialties Pain Management  [phone] No Review - Why Not? ----- Message ----- From: Minette Headland, CMA Sent: 12/25/2019   9:45 AM EDT To: Doreen Beam, FNP  Patient has been advised he states that he would like a referral to pulmonologist preferable in the area. Patient also mentioned on the phone wanting to know status of referral for his feet and shoulder pain? Patient states that he discussed both problems with you at his last office visit. Patient states that he would like to see Dr. Harl Favor who previously gave him knee injections.He states that he could see him for both problems? KW

## 2019-12-25 NOTE — Telephone Encounter (Signed)
Patient was advised of both messages below. Patient is in agreeance to go to Orthopedics, he requested to be seen at [facility] since he has been seen there in past. Patient states that he has not heard back from pain management. Patient states that he was previously going to pain clinic " all they do is give you shots," is was patient said. Please advise. KW

## 2019-12-26 ENCOUNTER — Telehealth: Payer: Self-pay | Admitting: Adult Health

## 2019-12-26 NOTE — Progress Notes (Deleted)
Established patient visit   Patient: Keith Gibbs   DOB: Oct 31, 1960   59 y.o. Male  MRN: [id number] Visit Date: 12/27/2019  Today's healthcare provider: Marcille Buffy, FNP   No chief complaint on file.  Subjective    HPI Follow up for testicular pain  The patient was last seen for this 2 weeks ago. Changes made at last visit include ordering scrotum doppler. He feels that condition is {improved/worse/unchanged:3041574}. Patient has a scheduled follow up visit to address testicular pain with urology on 01/01/20.   CLINICAL DATA:  Right scrotal pain and swelling for the past 2 weeks. The patient reports right testicular pain intermittently for 1 year.  EXAM: SCROTAL ULTRASOUND  DOPPLER ULTRASOUND OF THE TESTICLES  TECHNIQUE: Complete ultrasound examination of the testicles, epididymis, and other scrotal structures was performed. Color and spectral Doppler ultrasound were also utilized to evaluate blood flow to the testicles.  COMPARISON:  07/27/2016  FINDINGS: Right testicle  Measurements: 5.5 x 3.8 x 2.6 cm. No mass or microlithiasis visualized.  Left testicle  Measurements: 5.4 x 3.8 x 2.3 cm. No mass or microlithiasis visualized.  Right epididymis:  Normal in size and appearance.  Left epididymis:  Normal in size and appearance.  Hydrocele:  None visualized.  Varicocele:  Left varicocele laterally.  Pulsed Doppler interrogation of both testes demonstrates normal low resistance arterial and venous waveforms bilaterally.  IMPRESSION: 1. Left lateral varicocele. 2. Otherwise, normal examination.     -----------------------------------------------------------------------------------------   {Show patient history (optional):23778::" "}   Medications: Outpatient Medications Prior to Visit  Medication Sig  . albuterol (VENTOLIN HFA) 108 (90 Base) MCG/ACT inhaler Inhale 2 puffs into the lungs every 6 (six) hours as  needed for wheezing or shortness of breath.  Marland Kitchen aspirin EC 81 MG tablet Take 81 mg by mouth daily.  Marland Kitchen atorvastatin (LIPITOR) 40 MG tablet Take 1 tablet (40 mg total) by mouth daily at 6 PM. (Patient taking differently: Take 40 mg by mouth at bedtime. )  . Cetirizine HCl (ZYRTEC ALLERGY) 10 MG CAPS Take 1 capsule (10 mg total) by mouth 1 day or 1 dose for 30 doses. (Patient taking differently: Take 10 mg by mouth daily. )  . clopidogrel (PLAVIX) 75 MG tablet Take 75 mg by mouth daily.  . DULERA 200-5 MCG/ACT AERO TAKE 2 PUFFS BY MOUTH TWICE A DAY (Patient taking differently: Inhale 2 puffs into the lungs 2 (two) times daily. )  . DULoxetine (CYMBALTA) 60 MG capsule Take 1 capsule (60 mg total) by mouth daily.  . fluticasone (FLONASE) 50 MCG/ACT nasal spray Place 1 spray into both nostrils daily for 14 days.  Marland Kitchen gabapentin (NEURONTIN) 300 MG capsule Take 1 capsule (300 mg total) by mouth 2 (two) times daily.  . hydrOXYzine (ATARAX/VISTARIL) 25 MG tablet Take 1 tablet (25 mg total) by mouth 3 (three) times daily as needed. (Patient taking differently: Take 25 mg by mouth 3 (three) times daily as needed for anxiety. )  . isosorbide mononitrate (IMDUR) 60 MG 24 hr tablet Take 1 tablet (60 mg total) by mouth daily.  . Multiple Vitamins-Minerals (CENTRUM MEN PO) Take 1 tablet by mouth daily.   . nitroGLYCERIN (NITROSTAT) 0.4 MG SL tablet Place 1 tablet (0.4 mg total) under the tongue every 5 (five) minutes x 3 doses as needed for chest pain.  Marland Kitchen omeprazole (PRILOSEC) 40 MG capsule Take 1 capsule (40 mg total) by mouth daily.  . tamsulosin (FLOMAX) 0.4 MG CAPS capsule TAKE  1 CAPSULE BY MOUTH DAILY. (Patient taking differently: Take 0.4 mg by mouth daily. )   Facility-Administered Medications Prior to Visit  Medication Dose Route Frequency Provider  . sodium chloride flush (NS) 0.9 % injection 3 mL  3 mL Intravenous Q12H Jake Bathe, FNP  . sodium chloride flush (NS) 0.9 % injection 3 mL  3 mL  Intravenous Q12H Arnoldo Lenis, MD    Review of Systems  {Heme  Chem  Endocrine  Serology  Results Review (optional):23779::" "}  Objective    There were no vitals taken for this visit. {Show previous vital signs (optional):23777::" "}  Physical Exam  ***  No results found for any visits on 12/27/19.  Assessment & Plan     ***  No follow-ups on file.      {provider attestation***:1}   Marcille Buffy, [place] [phone] (phone) [phone] (fax)  [facility]

## 2019-12-26 NOTE — Telephone Encounter (Signed)
Yes psychiatry referral is okay. He keeps requesting pain management- however now Dr. Leatha Gilding and Dr. Humphrey Rolls have declined referral. We can inform patient and see if he has a preference.   Patient knows no narcotics will be given by primary care.

## 2019-12-26 NOTE — Telephone Encounter (Signed)
Please review. KW 

## 2019-12-26 NOTE — Telephone Encounter (Signed)
Patient is on schedule for 12/27/2019 provider will discuss with him at that time.

## 2019-12-26 NOTE — Telephone Encounter (Signed)
Cora, from [facility], called regarding the referral placed for pt. Keith Gibbs states that Dr. Humphrey Rolls is requesting to have the pt seen in a psychiatric facility due to his cocaine use. She states that pt was seen in their facility and that they had the same issues with him. Please advise.     [phone]

## 2019-12-27 ENCOUNTER — Ambulatory Visit: Payer: Self-pay | Admitting: Adult Health

## 2019-12-30 ENCOUNTER — Telehealth: Payer: Self-pay

## 2019-12-30 ENCOUNTER — Telehealth: Payer: Self-pay | Admitting: Adult Health

## 2019-12-30 DIAGNOSIS — J449 Chronic obstructive pulmonary disease, unspecified: Secondary | ICD-10-CM

## 2019-12-30 DIAGNOSIS — Z9889 Other specified postprocedural states: Secondary | ICD-10-CM

## 2019-12-30 DIAGNOSIS — M79671 Pain in right foot: Secondary | ICD-10-CM

## 2019-12-30 DIAGNOSIS — M79672 Pain in left foot: Secondary | ICD-10-CM

## 2019-12-30 NOTE — Telephone Encounter (Signed)
Pt would like a callback concerning his x-rays because he is concerned about having cancer. His callback number is [phone]

## 2019-12-30 NOTE — Telephone Encounter (Signed)
Copied from [place] [id number]. Topic: Referral - Status >> Dec 30, 2019  2:44 PM Gearldine Shown wrote: Reason for CRM: Pt is calling to check on the status of his feet and chest referrals. His contact number is [phone].

## 2019-12-30 NOTE — Telephone Encounter (Signed)
Patient given results of lumbar spine xray per notes of Michelle,FNP on 12/24/19. Patient verbalized understanding. Patient states that that he would need a referral for a neurosurgeon. Pt's previous neurosurgeon is in [place] and he is not able to get transportation there. Patient voicing concerns about having pain in both feet especially with sitting and then getting up to walk. Patient states that the pain does not go away and he can hardly walk. Patient states that he feels half numb from the knees down. Patient states that these symptoms have been discussed during previous office visits.  Patient also voicing concern of chest xray results from 12/20/19. Explained notes of [place] again to patient. Understanding verbalized understanding. Pt states he was concerned about having cancer and states that he was told a few years ago that he has 2 "specs" on this lung.Patient states his wife died of cancer approximately 5 months ago which has caused him to be scared about his results.  Patient rescheduled for appt on 7/1.Patient states that he has to arrange for transportation to get to appts and he was late to the previously cancelled appt because of this.  Patient had to cancel 6/30 appt due to court with landlord. Patient states that he has been going through some difficult times and his landlord is trying to put him out of the house which is why he has to go to court.

## 2019-12-31 NOTE — Progress Notes (Addendum)
01/01/20 10:06 PM   Charlotte Crumb W Leist November 07, 1960 [id number]  Referring provider: Doreen Beam, [place],  [place] [id number] No chief complaint on file.   HPI: Keith Gibbs is a 59 y.o. M w/ chronic substance use presents today for the evaluation and management of varicocele.   Multiple STD tests were performed in the past which were all negative. UA was also unremarkable in May however positive for cocaine use.   US scrotum w/ doppler from 12/13/19 revealed a left varicocele.   He reports of BL swelling of his testicles and experiences dull/aching pain when voiding. He also reports of experiencing right retracting testicle. He also notes of pain in his inguinal/groin area.  This is chronic, intermittent and often dull or achy.  He reports of increased urinary frequency. 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   Most recent PSA 0.7 as of 12/12/19.   PMH: Past Medical History:  Diagnosis Date  . Anxiety   . Arthritis   . Bursitis   . Chronic pain   . COPD (chronic obstructive pulmonary disease) ([place])   . Depression   . GERD (gastroesophageal reflux disease)   . Hepatitis C 2012   No longer has Hep C  . Hypertension   . Lung disease   . MI (myocardial infarction) ([place])   . Stroke ([place])   . Substance abuse [facility])     Surgical History: Past Surgical History:  Procedure Laterality Date  . ABDOMINAL SURGERY     removed small piece of intestines due to [facility] Diverticulosis  . APPENDECTOMY    . BACK SURGERY    . CARDIAC CATHETERIZATION Left 06/10/2016   Procedure: Left Heart Cath and Coronary Angiography;  Surgeon: Dionisio David, MD;  Location: [place] CV LAB;  Service: Cardiovascular;  Laterality: Left;  . CARDIAC CATHETERIZATION N/A 06/10/2016   Procedure: Coronary Stent Intervention;  Surgeon: Yolonda Kida, MD;  Location: [place] CV LAB;   Service: Cardiovascular;  Laterality: N/A;  . COLONOSCOPY    . COLONOSCOPY WITH PROPOFOL N/A 01/05/2017   Procedure: COLONOSCOPY WITH PROPOFOL;  Surgeon: Jonathon Bellows, MD;  Location: [facility] ENDOSCOPY;  Service: Endoscopy;  Laterality: N/A;  . CORONARY ANGIOPLASTY WITH STENT PLACEMENT    . ESOPHAGOGASTRODUODENOSCOPY (EGD) WITH PROPOFOL N/A 01/05/2017   Procedure: ESOPHAGOGASTRODUODENOSCOPY (EGD) WITH PROPOFOL;  Surgeon: Jonathon Bellows, MD;  Location: [facility] ENDOSCOPY;  Service: Endoscopy;  Laterality: N/A;  . INTRAVASCULAR PRESSURE WIRE/FFR STUDY N/A 12/04/2019   Procedure: INTRAVASCULAR PRESSURE WIRE/FFR STUDY;  Surgeon: Sherren Mocha, MD;  Location: [place] CV LAB;  Service: Cardiovascular;  Laterality: N/A;  . KNEE ARTHROSCOPY WITH MEDIAL MENISECTOMY Right 09/05/2017   Procedure: KNEE ARTHROSCOPY WITH MEDIAL AND LATERAL  MENISECTOMY PARTIAL SYNOVECTOMY;  Surgeon: Hessie Knows, MD;  Location: [facility] ORS;  Service: Orthopedics;  Laterality: Right;  . LEFT HEART CATH AND CORONARY ANGIOGRAPHY N/A 12/04/2019   Procedure: LEFT HEART CATH AND CORONARY ANGIOGRAPHY;  Surgeon: Sherren Mocha, MD;  Location: [place] CV LAB;  Service: Cardiovascular;  Laterality: N/A;  . SHOULDER SURGERY Right 04/09/2012  . SPINE SURGERY      Home Medications:  Allergies as of 01/01/2020      Reactions   Latuda [lurasidone Hcl] Other (See Comments)   tremor   Naproxen Hives, Swelling   Saphris [asenapine] Other (See Comments)   Increased tremors  Medication List       Accurate as of December 31, 2019 10:06 PM. If you have any questions, ask your nurse or doctor.        albuterol 108 (90 Base) MCG/ACT inhaler Commonly known as: VENTOLIN HFA Inhale 2 puffs into the lungs every 6 (six) hours as needed for wheezing or shortness of breath.   aspirin EC 81 MG tablet Take 81 mg by mouth daily.   atorvastatin 40 MG tablet Commonly known as: LIPITOR Take 1 tablet (40 mg total) by mouth daily at 6 PM. What changed:  when to take this   CENTRUM MEN PO Take 1 tablet by mouth daily.   clopidogrel 75 MG tablet Commonly known as: PLAVIX Take 75 mg by mouth daily.   Dulera 200-5 MCG/ACT Aero Generic drug: mometasone-formoterol TAKE 2 PUFFS BY MOUTH TWICE A DAY What changed:   how much to take  how to take this  when to take this  additional instructions   DULoxetine 60 MG capsule Commonly known as: Cymbalta Take 1 capsule (60 mg total) by mouth daily.   fluticasone 50 MCG/ACT nasal spray Commonly known as: FLONASE Place 1 spray into both nostrils daily for 14 days.   gabapentin 300 MG capsule Commonly known as: NEURONTIN Take 1 capsule (300 mg total) by mouth 2 (two) times daily.   hydrOXYzine 25 MG tablet Commonly known as: ATARAX/VISTARIL Take 1 tablet (25 mg total) by mouth 3 (three) times daily as needed. What changed: reasons to take this   isosorbide mononitrate 60 MG 24 hr tablet Commonly known as: IMDUR Take 1 tablet (60 mg total) by mouth daily.   nitroGLYCERIN 0.4 MG SL tablet Commonly known as: NITROSTAT Place 1 tablet (0.4 mg total) under the tongue every 5 (five) minutes x 3 doses as needed for chest pain.   omeprazole 40 MG capsule Commonly known as: PRILOSEC Take 1 capsule (40 mg total) by mouth daily.   tamsulosin 0.4 MG Caps capsule Commonly known as: FLOMAX TAKE 1 CAPSULE BY MOUTH DAILY.   ZyrTEC Allergy 10 MG Caps Generic drug: Cetirizine HCl Take 1 capsule (10 mg total) by mouth 1 day or 1 dose for 30 doses. What changed: when to take this       Allergies:  Allergies  Allergen Reactions  . Latuda [Lurasidone Hcl] Other (See Comments)    tremor   . Naproxen Hives and Swelling  . Saphris [Asenapine] Other (See Comments)    Increased tremors     Family History: Family History  Problem Relation Age of Onset  . Osteoarthritis Mother   . Heart disease Mother   . Hypertension Mother   . Depression Mother   . Heart disease Father   . Early  death Father   . Hypertension Father   . Heart attack Father   . Hypertension Sister   . Prostate cancer Neg Hx   . Bladder Cancer Neg Hx   . Kidney cancer Neg Hx     Social History:  reports that he has been smoking cigarettes. He has a 16.00 pack-year smoking history. He has never used smokeless tobacco. He reports current alcohol use of about 6.0 standard drinks of alcohol per week. He reports current drug use. Drugs: Cocaine and Marijuana.   Physical Exam: There were no vitals taken for this visit.  Constitutional:  Alert and oriented, No acute distress. HEENT: [place] AT, moist mucus membranes.  Trachea midline, no masses. Cardiovascular: No clubbing, cyanosis, or edema. Respiratory:  Normal respiratory effort, no increased work of breathing. GU: No CVA tenderness. Grade 2 left varicocele, easily papable, no evidence of inguinal hernia when standing. Penis normal circumcised w/ no lesions or discharge. Lymph: No  inguinal lymphadenopathy. Skin: No rashes, bruises or suspicious lesions. Neurologic: Grossly intact, no focal deficits, moving all 4 extremities. Psychiatric: Normal mood and affect.  Laboratory Data:  Lab Results  Component Value Date   CREATININE 1.07 12/02/2019   Pertinent Imaging: Results for orders placed during the hospital encounter of 12/13/19  US SCROTUM DOPPLER   Narrative CLINICAL DATA:  Right scrotal pain and swelling for the past 2 weeks. The patient reports right testicular pain intermittently for 1 year.  EXAM: SCROTAL ULTRASOUND  DOPPLER ULTRASOUND OF THE TESTICLES  TECHNIQUE: Complete ultrasound examination of the testicles, epididymis, and other scrotal structures was performed. Color and spectral Doppler ultrasound were also utilized to evaluate blood flow to the testicles.  COMPARISON:  07/27/2016  FINDINGS: Right testicle  Measurements: 5.5 x 3.8 x 2.6 cm. No mass or microlithiasis visualized.  Left testicle  Measurements: 5.4 x  3.8 x 2.3 cm. No mass or microlithiasis visualized.  Right epididymis:  Normal in size and appearance.  Left epididymis:  Normal in size and appearance.  Hydrocele:  None visualized.  Varicocele:  Left varicocele laterally.  Pulsed Doppler interrogation of both testes demonstrates normal low resistance arterial and venous waveforms bilaterally.  IMPRESSION: 1. Left lateral varicocele. 2. Otherwise, normal examination.   Electronically Signed   By: Claudie Revering M.D.   On: 12/13/2019 14:11    I have personally reviewed the images and agree with radiologist interpretation.   Assessment & Plan:    1. Left Varicocele Chronic, incidental finding w/ minimal bother  2. Right Groin pain  No hernia noted on PE No evidence of hernia as contributing factor Recommended use of scrotal support   3. Chronic Testicular pain  Advised to return at time of scrotal swelling - asymptomatic today with benign exam Exam today normal and reassuring Varicocele likely unrelated Return if he experiences severe pain or swelling for an acute evaluation   4. Penile lesion  Reports of recent herpes outbreak Consult w/ PCP for Valtrex as needed  F/u prn  [facility] [address], [place], [place] [address] [phone]  I, Lucas Mallow, am acting as a scribe for Dr. Hollice Espy,  I have reviewed the above documentation for accuracy and completeness, and I agree with the above.   Hollice Espy, MD  I spent 45 total minutes on the day of the encounter including pre-visit review of the medical record, face-to-face time with the patient, and post visit ordering of labs/imaging/tests.  I spent quite a long time in the room with the patient today offering reassurance.

## 2019-12-31 NOTE — Telephone Encounter (Signed)
Pt. Returned call. Reports he is in agreement with the [place] location for Pulmonary referral.

## 2019-12-31 NOTE — Telephone Encounter (Signed)
Left detailed voicemail letting patient know the podiatry referral has been placed and awaiting response, in reference to pulmonology referral looks like they left a voicemail on patients phone on 12/25/19 to inquire to see if patient could come to [place] location. If patient returns call please relay this message to him. Thanks. KW

## 2019-12-31 NOTE — Telephone Encounter (Signed)
See message below about pending referral. KW

## 2019-12-31 NOTE — Telephone Encounter (Signed)
  Ok to place referral to neurosurgery. Will try and get MRI of lumbar spine approved. He should also keep follow up with his orthopedic for his chronic knee pain. Provider has only seen patient at initial visit and then missed appointments for follow up are documented and 6/10 appointment has now been rescheduled for further out in July. No note of numbness from knee down being reported- bilaterally- patient did report chronic knee, and back pain. Please verify if this numbness is new or if this is chronic ? No change in skin color ? Pain in lower extremities ? He really needs follow up in office sooner and may offer to him.   Please inform pain management clinics declined referral, one he has been seen at before. The other recommended psychiatry- please verify if he would like a referral to psychiatry or has another pain clinic he would like [place] to try ?   Understand patient has a lot going on however keeping regular follow up's is crucial for his care given the chronic conditions he has.  Emergent symptoms of back pain include any loss of bowel or bladder control, groin numbness, any worsening pain,  radiculopathy/ paresthesias and for any of these he should go to the emergency room/ call 911 immediately.

## 2019-12-31 NOTE — Telephone Encounter (Signed)
Please advise 

## 2020-01-01 ENCOUNTER — Other Ambulatory Visit: Payer: Self-pay

## 2020-01-01 ENCOUNTER — Encounter: Payer: Self-pay | Admitting: Urology

## 2020-01-01 ENCOUNTER — Ambulatory Visit (INDEPENDENT_AMBULATORY_CARE_PROVIDER_SITE_OTHER): Payer: Medicaid Other | Admitting: Urology

## 2020-01-01 ENCOUNTER — Ambulatory Visit: Payer: Self-pay | Admitting: Adult Health

## 2020-01-01 VITALS — BP 149/87 | HR 66 | Ht 75.0 in | Wt 224.0 lb

## 2020-01-01 DIAGNOSIS — N433 Hydrocele, unspecified: Secondary | ICD-10-CM

## 2020-01-01 DIAGNOSIS — N50811 Right testicular pain: Secondary | ICD-10-CM

## 2020-01-01 DIAGNOSIS — N489 Disorder of penis, unspecified: Secondary | ICD-10-CM | POA: Diagnosis not present

## 2020-01-01 NOTE — Telephone Encounter (Signed)
Referral has been placed, patient reports chronic numbness below knees since his back surgery, he states that he has had no discoloration of skin. Patient reports that he has a stabbing sensation in his hips and states that knee pain is constant and describes pain in knees as a dull ache. Patient stats that he did not want you to put in referral for pain management because they did not help in past. Patient request you put in order for pain clinic,he states that he has seen Dr. Darrow Bussing on [facility]. I advised patient to keep appointments as scheduled he states that he has to move out of home by 01/23/20 because he has been evicted.patient has cancelled upcoming appointments and states that when he finds a place to live he will call back to rescheduled. KW

## 2020-01-01 NOTE — Telephone Encounter (Signed)
Clarification he was reffered to Dr. Humphrey Rolls as he originally requested however the officce refused to see patinet at the pain clinic. So refferals tried pain management who also declined services and recommended psychiatry.      Service Details Procedure Modifiers Provider Requested Approved  [id number] - AMB REFERRAL TO PAIN CLINIC None [name], MD 1 1   Unable to help patient if he is unable to keep appointments. Emergency care recommended for any emergent or worsening symptoms. Ambulatory to neurosurgery order was signed.

## 2020-01-02 ENCOUNTER — Ambulatory Visit: Payer: Self-pay

## 2020-01-08 ENCOUNTER — Other Ambulatory Visit: Payer: Self-pay

## 2020-01-08 ENCOUNTER — Encounter: Payer: Self-pay | Admitting: Cardiology

## 2020-01-08 ENCOUNTER — Ambulatory Visit (INDEPENDENT_AMBULATORY_CARE_PROVIDER_SITE_OTHER): Payer: Medicaid Other | Admitting: Cardiology

## 2020-01-08 VITALS — BP 114/78 | HR 55 | Ht 75.0 in | Wt 225.8 lb

## 2020-01-08 DIAGNOSIS — R0789 Other chest pain: Secondary | ICD-10-CM | POA: Diagnosis not present

## 2020-01-08 DIAGNOSIS — I25119 Atherosclerotic heart disease of native coronary artery with unspecified angina pectoris: Secondary | ICD-10-CM

## 2020-01-08 NOTE — Patient Instructions (Signed)
Medication Instructions:   Stop Plavix.  Continue all other medications.    Labwork: none  Testing/Procedures: none  Follow-Up: 6 months   Any Other Special Instructions Will Be Listed Below (If Applicable).  If you need a refill on your cardiac medications before your next appointment, please call your pharmacy.  

## 2020-01-08 NOTE — Progress Notes (Signed)
Clinical Summary Keith Gibbs is a 59 y.o.male seen today for follow up of the following medical problems. This is a focused visit on recent symptoms of chest pain and recent cath.    1. CAD - from Dr Laurelyn Sickle notes 11/2017coronary CTA showed 90% lesion mid LAD in setting of unstable angina -had subsequent DES to LAD lesion at that time -02/2018 nuclear stress Moderate inferior reversible defect.  02/2018 echo Normal LV systolic dysfunction LVEF 60%, mild inferior hypokinesis, grade I DDX, mild to mod AI  - has had some chest pain - sharp pain left sided, 6/10 in severity. Can have some SOB. Comes on with rest or activity. For example house painting recently climbing up ladder. Pain lasts just a few minutes - ongoing a few weeks. Increase in frequency, increase severity - worst with deep breathing - some increased DOEwith activities   - ongoing symptoms since last visit - was not able to go for cath due to transportation issues, also was getting over a severe cold  11/2019 cath: patent LAD stent, prox to mid LAD 60%, not significant by FFR. - chest pains have improved since last visit    Past Medical History:  Diagnosis Date  . Anxiety   . Arthritis   . Bursitis   . Chronic pain   . COPD (chronic obstructive pulmonary disease) ([place])   . Depression   . GERD (gastroesophageal reflux disease)   . Hepatitis C 2012   No longer has Hep C  . Hypertension   . Lung disease   . MI (myocardial infarction) ([place])   . Stroke ([place])   . Substance abuse ([facility])      Allergies  Allergen Reactions  . Latuda [Lurasidone Hcl] Other (See Comments)    tremor   . Naproxen Hives and Swelling  . Saphris [Asenapine] Other (See Comments)    Increased tremors      Current Outpatient Medications  Medication Sig Dispense Refill  . albuterol (VENTOLIN HFA) 108 (90 Base) MCG/ACT inhaler Inhale 2 puffs into the lungs every 6 (six) hours as needed for wheezing or shortness of breath.  18 g 6  . aspirin EC 81 MG tablet Take 81 mg by mouth daily.    Marland Kitchen atorvastatin (LIPITOR) 40 MG tablet Take 1 tablet (40 mg total) by mouth daily at 6 PM. (Patient taking differently: Take 40 mg by mouth at bedtime. ) 30 tablet 1  . Cetirizine HCl (ZYRTEC ALLERGY) 10 MG CAPS Take 1 capsule (10 mg total) by mouth 1 day or 1 dose for 30 doses. (Patient taking differently: Take 10 mg by mouth daily. ) 30 capsule 0  . clopidogrel (PLAVIX) 75 MG tablet Take 75 mg by mouth daily.    . DULERA 200-5 MCG/ACT AERO TAKE 2 PUFFS BY MOUTH TWICE A DAY (Patient taking differently: Inhale 2 puffs into the lungs 2 (two) times daily. ) 13 g 6  . DULoxetine (CYMBALTA) 60 MG capsule Take 1 capsule (60 mg total) by mouth daily. 60 capsule 6  . fluticasone (FLONASE) 50 MCG/ACT nasal spray Place 1 spray into both nostrils daily for 14 days. 16 g 0  . gabapentin (NEURONTIN) 300 MG capsule Take 1 capsule (300 mg total) by mouth 2 (two) times daily. 60 capsule 3  . hydrOXYzine (ATARAX/VISTARIL) 25 MG tablet Take 1 tablet (25 mg total) by mouth 3 (three) times daily as needed. (Patient taking differently: Take 25 mg by mouth 3 (three) times daily as needed for  anxiety. ) 90 tablet 3  . isosorbide mononitrate (IMDUR) 60 MG 24 hr tablet Take 1 tablet (60 mg total) by mouth daily. 30 tablet 5  . Multiple Vitamins-Minerals (CENTRUM MEN PO) Take 1 tablet by mouth daily.     . nitroGLYCERIN (NITROSTAT) 0.4 MG SL tablet Place 1 tablet (0.4 mg total) under the tongue every 5 (five) minutes x 3 doses as needed for chest pain. 30 tablet 3  . omeprazole (PRILOSEC) 40 MG capsule Take 1 capsule (40 mg total) by mouth daily. 30 capsule 6  . tamsulosin (FLOMAX) 0.4 MG CAPS capsule TAKE 1 CAPSULE BY MOUTH DAILY. (Patient taking differently: Take 0.4 mg by mouth daily. ) 30 capsule 2   Current Facility-Administered Medications  Medication Dose Route Frequency Provider Last Rate Last Admin  . sodium chloride flush (NS) 0.9 % injection 3 mL  3  mL Intravenous Q12H Dana Debo, Alphonse Guild, MD       Facility-Administered Medications Ordered in Other Visits  Medication Dose Route Frequency Provider Last Rate Last Admin  . sodium chloride flush (NS) 0.9 % injection 3 mL  3 mL Intravenous Q12H Jake Bathe, FNP         Past Surgical History:  Procedure Laterality Date  . ABDOMINAL SURGERY     removed small piece of intestines due to [facility] Diverticulosis  . APPENDECTOMY    . BACK SURGERY    . CARDIAC CATHETERIZATION Left 06/10/2016   Procedure: Left Heart Cath and Coronary Angiography;  Surgeon: Dionisio David, MD;  Location: [place] CV LAB;  Service: Cardiovascular;  Laterality: Left;  . CARDIAC CATHETERIZATION N/A 06/10/2016   Procedure: Coronary Stent Intervention;  Surgeon: Yolonda Kida, MD;  Location: [place] CV LAB;  Service: Cardiovascular;  Laterality: N/A;  . COLONOSCOPY    . COLONOSCOPY WITH PROPOFOL N/A 01/05/2017   Procedure: COLONOSCOPY WITH PROPOFOL;  Surgeon: Jonathon Bellows, MD;  Location: [facility] ENDOSCOPY;  Service: Endoscopy;  Laterality: N/A;  . CORONARY ANGIOPLASTY WITH STENT PLACEMENT    . ESOPHAGOGASTRODUODENOSCOPY (EGD) WITH PROPOFOL N/A 01/05/2017   Procedure: ESOPHAGOGASTRODUODENOSCOPY (EGD) WITH PROPOFOL;  Surgeon: Jonathon Bellows, MD;  Location: [facility] ENDOSCOPY;  Service: Endoscopy;  Laterality: N/A;  . INTRAVASCULAR PRESSURE WIRE/FFR STUDY N/A 12/04/2019   Procedure: INTRAVASCULAR PRESSURE WIRE/FFR STUDY;  Surgeon: Sherren Mocha, MD;  Location: [place] CV LAB;  Service: Cardiovascular;  Laterality: N/A;  . KNEE ARTHROSCOPY WITH MEDIAL MENISECTOMY Right 09/05/2017   Procedure: KNEE ARTHROSCOPY WITH MEDIAL AND LATERAL  MENISECTOMY PARTIAL SYNOVECTOMY;  Surgeon: Hessie Knows, MD;  Location: [facility] ORS;  Service: Orthopedics;  Laterality: Right;  . LEFT HEART CATH AND CORONARY ANGIOGRAPHY N/A 12/04/2019   Procedure: LEFT HEART CATH AND CORONARY ANGIOGRAPHY;  Surgeon: Sherren Mocha, MD;  Location:  [place] CV LAB;  Service: Cardiovascular;  Laterality: N/A;  . SHOULDER SURGERY Right 04/09/2012  . SPINE SURGERY       Allergies  Allergen Reactions  . Latuda [Lurasidone Hcl] Other (See Comments)    tremor   . Naproxen Hives and Swelling  . Saphris [Asenapine] Other (See Comments)    Increased tremors       Family History  Problem Relation Age of Onset  . Osteoarthritis Mother   . Heart disease Mother   . Hypertension Mother   . Depression Mother   . Heart disease Father   . Early death Father   . Hypertension Father   . Heart attack Father   . Hypertension Sister   . Prostate cancer  Neg Hx   . Bladder Cancer Neg Hx   . Kidney cancer Neg Hx      Social History Mr. Guillot reports that he has been smoking cigarettes. He has a 16.00 pack-year smoking history. He has never used smokeless tobacco. Mr. Lares reports current alcohol use of about 6.0 standard drinks of alcohol per week.   Review of Systems CONSTITUTIONAL: No weight loss, fever, chills, weakness or fatigue.  HEENT: Eyes: No visual loss, blurred vision, double vision or yellow sclerae.No hearing loss, sneezing, congestion, runny nose or sore throat.  SKIN: No rash or itching.  CARDIOVASCULAR: per hpi RESPIRATORY: No shortness of breath, cough or sputum.  GASTROINTESTINAL: No anorexia, nausea, vomiting or diarrhea. No abdominal pain or blood.  GENITOURINARY: No burning on urination, no polyuria NEUROLOGICAL: No headache, dizziness, syncope, paralysis, ataxia, numbness or tingling in the extremities. No change in bowel or bladder control.  MUSCULOSKELETAL: No muscle, back pain, joint pain or stiffness.  LYMPHATICS: No enlarged nodes. No history of splenectomy.  PSYCHIATRIC: No history of depression or anxiety.  ENDOCRINOLOGIC: No reports of sweating, cold or heat intolerance. No polyuria or polydipsia.  Marland Kitchen   Physical Examination Today's Vitals   01/08/20 1036  BP: 114/78  Pulse: (!) 55  SpO2:  95%  Weight: 225 lb 12.8 oz (102.4 kg)  Height: 6' 3" (1.905 m)   Body mass index is 28.22 kg/m.  Gen: resting comfortably, no acute distress HEENT: no scleral icterus, pupils equal round and reactive, no palptable cervical adenopathy,  CV: RRR, no m/r/g,no jvd Resp: Clear to auscultation bilaterally GI: abdomen is soft, non-tender, non-distended, normal bowel sounds, no hepatosplenomegaly MSK: extremities are warm, no edema.  Skin: warm, no rash Neuro:  no focal deficits Psych: appropriate affect   Diagnostic Studies 05/2016 cath  Prox LAD to Mid LAD lesion, 90 %stenosed.  LVEF 60% with high grade lesion mid LAD and no significant LCX/RCA. PCI with DES.  A STENT XIENCE ALPINE RX 2.5X18 drug eluting stent was successfully placed, and does not overlap previously placed stent.  Mid LAD lesion, 90 %stenosed.  Post intervention, there is a 0% residual stenosis.  Successful PCI and stent of mid LAD lesion with the DES stent 2.5 x 18 mm xience to 14 atm  02/2018 nuclear stress Moderate inferior reversible defect.    02/2018 echo Normal LV systolic dysfunction LVEF 60%, mild inferior hypokinesis, grade I DDX, mild to mod AI  11/2019 cath  Previously placed Mid LAD drug eluting stent is widely patent.  Balloon angioplasty was performed.  Prox LAD to Mid LAD lesion is 60% stenosed.  The left ventricular systolic function is normal.  LV end diastolic pressure is normal.  The left ventricular ejection fraction is greater than 65% by visual estimate.   1. Single vessel CAD with moderate in-stent restenosis in the LAD, negative pressure wire evaluation 2. Patent left main, LCx, and RCA without significant stenosis 3. Normal LV function, LVEF 65%    Assessment and Plan  1. CAD/chest pain - recent cath without out significant recurrent CAD - chest pain has since improved - he will d/c plavix, continue other meds   F/u 6 months      Arnoldo Lenis,  M.D.

## 2020-01-09 ENCOUNTER — Ambulatory Visit (HOSPITAL_COMMUNITY): Payer: Medicaid Other | Admitting: Clinical

## 2020-01-09 ENCOUNTER — Telehealth (HOSPITAL_COMMUNITY): Payer: Self-pay | Admitting: Clinical

## 2020-01-09 ENCOUNTER — Ambulatory Visit: Payer: Medicaid Other | Attending: Internal Medicine

## 2020-01-09 NOTE — Telephone Encounter (Signed)
The OPT therapist attempted text to session x2 @ 2:00PM and 2:10PM, however, the patient did not respond missing their scheduled appointment.

## 2020-01-15 ENCOUNTER — Telehealth: Payer: Self-pay | Admitting: Adult Health

## 2020-01-15 DIAGNOSIS — M79672 Pain in left foot: Secondary | ICD-10-CM

## 2020-01-15 DIAGNOSIS — M79671 Pain in right foot: Secondary | ICD-10-CM

## 2020-01-15 NOTE — Telephone Encounter (Signed)
Referral to podiatry placed

## 2020-01-15 NOTE — Telephone Encounter (Signed)
Please review. Thanks!  

## 2020-01-15 NOTE — Telephone Encounter (Signed)
Pt is requesting a referral to see a podiatrist for bilateral foot pain. He would like to be seen at [facility]

## 2020-01-22 ENCOUNTER — Ambulatory Visit: Payer: Self-pay | Admitting: Adult Health

## 2020-01-23 ENCOUNTER — Ambulatory Visit: Payer: Self-pay | Admitting: Adult Health

## 2020-01-29 ENCOUNTER — Ambulatory Visit (INDEPENDENT_AMBULATORY_CARE_PROVIDER_SITE_OTHER): Payer: Medicaid Other | Admitting: Gastroenterology

## 2020-01-29 ENCOUNTER — Encounter: Payer: Self-pay | Admitting: Gastroenterology

## 2020-01-29 ENCOUNTER — Other Ambulatory Visit: Payer: Self-pay

## 2020-01-29 VITALS — BP 99/60 | HR 71 | Temp 98.4°F | Ht 75.0 in | Wt 218.0 lb

## 2020-01-29 DIAGNOSIS — K5900 Constipation, unspecified: Secondary | ICD-10-CM | POA: Diagnosis not present

## 2020-01-29 DIAGNOSIS — K7469 Other cirrhosis of liver: Secondary | ICD-10-CM

## 2020-01-29 MED ORDER — NA SULFATE-K SULFATE-MG SULF 17.5-3.13-1.6 GM/177ML PO SOLN: ORAL | 0 refills | Status: DC

## 2020-01-29 NOTE — Patient Instructions (Signed)
Please take Miralax 17 Grams daily.   Polyethylene Glycol powder What is this medicine? POLYETHYLENE GLYCOL 3350 (pol ee ETH i leen; GLYE col) powder is a laxative used to treat constipation. It increases the amount of water in the stool. Bowel movements become easier and more frequent. This medicine may be used for other purposes; ask your health care provider or pharmacist if you have questions. COMMON BRAND NAME(S): GaviLax, GIALAX, GlycoLax, Healthylax, MiraLax, Smooth LAX, Vita Health What should I tell my health care provider before I take this medicine? They need to know if you have any of these conditions:  a history of blockage of the stomach or intestine  current abdomen distension or pain  difficulty swallowing  diverticulitis, ulcerative colitis, or other chronic bowel disease  phenylketonuria  an unusual or allergic reaction to polyethylene glycol, other medicines, dyes, or preservatives  pregnant or trying to get pregnant  breast-feeding How should I use this medicine? Take this medicine by mouth. The bottle has a measuring cap that is marked with a line. Pour the powder into the cap up to the marked line (the dose is about 1 heaping tablespoon). Add the powder in the cap to a full glass (4 to 8 ounces or 120 to 240 mL) of water, juice, soda, coffee or tea. Mix the powder well. Ensure that the powder is fully dissolved. Do not drink if there are any clumps. Drink the solution. Take exactly as directed. Do not take your medicine more often than directed. Talk to your pediatrician regarding the use of this medicine in children. Special care may be needed. Overdosage: If you think you have taken too much of this medicine contact a poison control center or emergency room at once. NOTE: This medicine is only for you. Do not share this medicine with others. What if I miss a dose? If you miss a dose, take it as soon as you can. If it is almost time for your next dose, take only  that dose. Do not take double or extra doses. What may interact with this medicine? Interactions are not expected. This list may not describe all possible interactions. Give your health care provider a list of all the medicines, herbs, non-prescription drugs, or dietary supplements you use. Also tell them if you smoke, drink alcohol, or use illegal drugs. Some items may interact with your medicine. What should I watch for while using this medicine? Do not use for more than 2 weeks without advice from your doctor or health care professional. It can take 2 to 4 days to have a bowel movement and to experience improvement in constipation. See your health care professional for any changes in bowel habits, including constipation, that are severe or last longer than three weeks. Always take this medicine with plenty of water. What side effects may I notice from receiving this medicine? Side effects that you should report to your doctor or health care professional as soon as possible:  diarrhea  difficulty breathing  itching of the skin, hives, or skin rash  severe bloating, pain, or distension of the stomach  vomiting Side effects that usually do not require medical attention (report to your doctor or health care professional if they continue or are bothersome):  bloating or gas  lower abdominal discomfort or cramps  nausea This list may not describe all possible side effects. Call your doctor for medical advice about side effects. You may report side effects to FDA at 1-800-FDA-1088. Where should I keep my   medicine? Keep out of the reach of children. Store between 15 and 30 degrees C (59 and 86 degrees F). Throw away any unused medicine after the expiration date. NOTE: This sheet is a summary. It may not cover all possible information. If you have questions about this medicine, talk to your doctor, pharmacist, or health care provider.  2020 Elsevier/Gold Standard (2017-12-28  10:42:01) High-Fiber Diet Fiber, also called dietary fiber, is a type of carbohydrate that is found in fruits, vegetables, whole grains, and beans. A high-fiber diet can have many health benefits. Your health care provider may recommend a high-fiber diet to help:  Prevent constipation. Fiber can make your bowel movements more regular.  Lower your cholesterol.  Relieve the following conditions: ? Swelling of veins in the anus (hemorrhoids). ? Swelling and irritation (inflammation) of specific areas of the digestive tract (uncomplicated diverticulosis). ? A problem of the large intestine (colon) that sometimes causes pain and diarrhea (irritable bowel syndrome, IBS).  Prevent overeating as part of a weight-loss plan.  Prevent heart disease, type 2 diabetes, and certain cancers. What is my plan? The recommended daily fiber intake in grams (g) includes:  38 g for men age 50 or younger.  30 g for men over age 50.  25 g for women age 50 or younger.  21 g for women over age 50. You can get the recommended daily intake of dietary fiber by:  Eating a variety of fruits, vegetables, grains, and beans.  Taking a fiber supplement, if it is not possible to get enough fiber through your diet. What do I need to know about a high-fiber diet?  It is better to get fiber through food sources rather than from fiber supplements. There is not a lot of research about how effective supplements are.  Always check the fiber content on the nutrition facts label of any prepackaged food. Look for foods that contain 5 g of fiber or more per serving.  Talk with a diet and nutrition specialist (dietitian) if you have questions about specific foods that are recommended or not recommended for your medical condition, especially if those foods are not listed below.  Gradually increase how much fiber you consume. If you increase your intake of dietary fiber too quickly, you may have bloating, cramping, or  gas.  Drink plenty of water. Water helps you to digest fiber. What are tips for following this plan?  Eat a wide variety of high-fiber foods.  Make sure that half of the grains that you eat each day are whole grains.  Eat breads and cereals that are made with whole-grain flour instead of refined flour or white flour.  Eat brown rice, bulgur wheat, or millet instead of white rice.  Start the day with a breakfast that is high in fiber, such as a cereal that contains 5 g of fiber or more per serving.  Use beans in place of meat in soups, salads, and pasta dishes.  Eat high-fiber snacks, such as berries, raw vegetables, nuts, and popcorn.  Choose whole fruits and vegetables instead of processed forms like juice or sauce. What foods can I eat?  Fruits Berries. Pears. Apples. Oranges. Avocado. Prunes and raisins. Dried figs. Vegetables Sweet potatoes. Spinach. Kale. Artichokes. Cabbage. Broccoli. Cauliflower. Green peas. Carrots. Squash. Grains Whole-grain breads. Multigrain cereal. Oats and oatmeal. Brown rice. Barley. Bulgur wheat. Millet. Quinoa. Bran muffins. Popcorn. Rye wafer crackers. Meats and other proteins Navy, kidney, and pinto beans. Soybeans. Split peas. Lentils. Nuts and seeds. Dairy   Fiber-fortified yogurt. Beverages Fiber-fortified soy milk. Fiber-fortified orange juice. Other foods Fiber bars. The items listed above may not be a complete list of recommended foods and beverages. Contact a dietitian for more options. What foods are not recommended? Fruits Fruit juice. Cooked, strained fruit. Vegetables Fried potatoes. Canned vegetables. Well-cooked vegetables. Grains White bread. Pasta made with refined flour. White rice. Meats and other proteins Fatty cuts of meat. Fried chicken or fried fish. Dairy Milk. Yogurt. Cream cheese. Sour cream. Fats and oils Butters. Beverages Soft drinks. Other foods Cakes and pastries. The items listed above may not be a  complete list of foods and beverages to avoid. Contact a dietitian for more information. Summary  Fiber is a type of carbohydrate. It is found in fruits, vegetables, whole grains, and beans.  There are many health benefits of eating a high-fiber diet, such as preventing constipation, lowering blood cholesterol, helping with weight loss, and reducing your risk of heart disease, diabetes, and certain cancers.  Gradually increase your intake of fiber. Increasing too fast can result in cramping, bloating, and gas. Drink plenty of water while you increase your fiber.  The best sources of fiber include whole fruits and vegetables, whole grains, nuts, seeds, and beans. This information is not intended to replace advice given to you by your health care provider. Make sure you discuss any questions you have with your health care provider. Document Revised: 05/15/2017 Document Reviewed: 05/15/2017 Elsevier Patient Education  2020 Elsevier Inc.  

## 2020-01-30 ENCOUNTER — Other Ambulatory Visit: Payer: Self-pay | Admitting: Family Medicine

## 2020-01-30 DIAGNOSIS — M5136 Other intervertebral disc degeneration, lumbar region: Secondary | ICD-10-CM

## 2020-01-30 DIAGNOSIS — F32A Depression, unspecified: Secondary | ICD-10-CM

## 2020-01-30 LAB — COMPREHENSIVE METABOLIC PANEL
ALT: 15 IU/L (ref 0–44)
AST: 19 IU/L (ref 0–40)
Albumin/Globulin Ratio: 1.8 (ref 1.2–2.2)
Albumin: 4.6 g/dL (ref 3.8–4.9)
Alkaline Phosphatase: 78 IU/L (ref 48–121)
BUN/Creatinine Ratio: 12 (ref 9–20)
BUN: 13 mg/dL (ref 6–24)
Bilirubin Total: 0.7 mg/dL (ref 0.0–1.2)
CO2: 22 mmol/L (ref 20–29)
Calcium: 9.1 mg/dL (ref 8.7–10.2)
Chloride: 99 mmol/L (ref 96–106)
Creatinine, Ser: 1.1 mg/dL (ref 0.76–1.27)
GFR calc Af Amer: 84 mL/min/{1.73_m2} (ref >59)
GFR calc non Af Amer: 73 mL/min/{1.73_m2} (ref >59)
Globulin, Total: 2.5 g/dL (ref 1.5–4.5)
Glucose: 101 mg/dL — ABNORMAL HIGH (ref 65–99)
Potassium: 4.1 mmol/L (ref 3.5–5.2)
Sodium: 135 mmol/L (ref 134–144)
Total Protein: 7.1 g/dL (ref 6.0–8.5)

## 2020-01-30 LAB — PROTIME-INR
INR: 1 (ref 0.9–1.2)
Prothrombin Time: 11.2 s (ref 9.1–12.0)

## 2020-01-30 NOTE — Progress Notes (Signed)
Keith Gibbs [address]  [place], [place] [address]  Main: [phone]  Fax: [phone]   Gastroenterology Consultation  Referring Provider:     Sharmon Leyden* Primary Care Physician:  Doreen Beam, FNP Reason for Consultation:     Abdominal pain        HPI:    Chief Complaint  Patient presents with  . New Patient (Initial Visit)  . Low abdominal pain    Keith Gibbs is a 59 y.o. y/o male referred for consultation & management  by Dr. Claudina Lick, Kelby Aline, FNP.  Patient previously seen by Dr. Allen Norris and Dr. 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  Patient reports the pain is cramping, starts when he has had a bowel movement, and resolves a few minutes after he has completed his bowel movement.  Does not have a bowel movement daily.  Does strain with bowel movements at times.  No blood in stool.  No family history of colon cancer.  Used to drink daily but now reports occasional use, about once every other month.  Last colonoscopy in 2018 with Dr. Vicente Males with 5 polyps removed.  Diverticulosis and internal hemorrhoids reported.  Pathology showed tubular adenoma in 4 of the polyps.  Upper endoscopy in June 2018 for variceal screening did not show any varices.  Repeat for both procedures were recommended in 3 years.  Past Medical History:  Diagnosis Date  . Anxiety   . Arthritis   . Bursitis   . Chronic pain   . COPD (chronic obstructive pulmonary disease) ([place])   . Depression   . GERD (gastroesophageal reflux disease)   . Hepatitis C 2012   No longer has Hep C  . Hypertension   . Lung disease   . MI (myocardial infarction) ([place])   . Stroke ([place])   . Substance abuse [facility])     Past Surgical History:  Procedure Laterality Date  . ABDOMINAL SURGERY     removed small piece of intestines due to [facility] Diverticulosis  . APPENDECTOMY    . BACK  SURGERY    . CARDIAC CATHETERIZATION Left 06/10/2016   Procedure: Left Heart Cath and Coronary Angiography;  Surgeon: Dionisio David, MD;  Location: [place] CV [facility];  Service: Cardiovascular;  Laterality: Left;  . CARDIAC CATHETERIZATION N/A 06/10/2016   Procedure: Coronary Stent Intervention;  Surgeon: Yolonda Kida, MD;  Location: [place] [facility] LAB;  Service: Cardiovascular;  Laterality: N/A;  . COLONOSCOPY    . COLONOSCOPY WITH PROPOFOL N/A 01/05/2017   Procedure: COLONOSCOPY WITH PROPOFOL;  Surgeon: Jonathon Bellows, MD;  Location: [facility] ENDOSCOPY;  Service: Endoscopy;  Laterality: N/A;  . CORONARY ANGIOPLASTY WITH STENT PLACEMENT    . ESOPHAGOGASTRODUODENOSCOPY (EGD) WITH PROPOFOL N/A 01/05/2017   Procedure: ESOPHAGOGASTRODUODENOSCOPY (EGD) WITH PROPOFOL;  Surgeon: Jonathon Bellows, MD;  Location: [facility] ENDOSCOPY;  Service: Endoscopy;  Laterality: N/A;  . INTRAVASCULAR PRESSURE WIRE/FFR STUDY N/A 12/04/2019   Procedure: INTRAVASCULAR PRESSURE WIRE/FFR STUDY;  Surgeon: Sherren Mocha, MD;  Location: [place] [facility];  Service: Cardiovascular;  Laterality: N/A;  . KNEE ARTHROSCOPY WITH MEDIAL MENISECTOMY Right 09/05/2017   Procedure: KNEE ARTHROSCOPY WITH MEDIAL AND LATERAL  MENISECTOMY PARTIAL SYNOVECTOMY;  Surgeon: Hessie Knows, MD;  Location: [facility] ORS;  Service: Orthopedics;  Laterality: Right;  . LEFT HEART CATH AND CORONARY ANGIOGRAPHY N/A 12/04/2019   Procedure: LEFT HEART CATH AND  CORONARY ANGIOGRAPHY;  Surgeon: Sherren Mocha, MD;  Location: [place] [facility] LAB;  Service: Cardiovascular;  Laterality: N/A;  . SHOULDER SURGERY Right 04/09/2012  . SPINE SURGERY      Prior to Admission medications   Medication Sig Start Date End Date Taking? Authorizing Provider  albuterol (VENTOLIN HFA) 108 (90 Base) MCG/ACT inhaler Inhale 2 puffs into the lungs every 6 (six) hours as needed for wheezing or shortness of breath. 05/28/19  Yes Charlott Rakes, MD  aspirin EC 81 MG tablet Take 81 mg by  mouth daily.   Yes [provider]  atorvastatin (LIPITOR) 40 MG tablet Take 1 tablet (40 mg total) by mouth daily at 6 PM. Patient taking differently: Take 40 mg by mouth at bedtime.  12/30/16  Yes Pucilowska, Jolanta B, MD  Cetirizine HCl (ZYRTEC ALLERGY) 10 MG CAPS Take 1 capsule (10 mg total) by mouth 1 day or 1 dose for 30 doses. Patient taking differently: Take 10 mg by mouth daily.  10/09/19 01/07/21 Yes Avegno, Darrelyn Hillock, FNP  DULERA 200-5 MCG/ACT AERO TAKE 2 PUFFS BY MOUTH TWICE A DAY Patient taking differently: Inhale 2 puffs into the lungs 2 (two) times daily.  05/28/19  Yes Charlott Rakes, MD  DULoxetine (CYMBALTA) 60 MG capsule Take 1 capsule (60 mg total) by mouth daily. 05/28/19  Yes Charlott Rakes, MD  gabapentin (NEURONTIN) 300 MG capsule Take 1 capsule (300 mg total) by mouth 2 (two) times daily. 05/28/19  Yes Charlott Rakes, MD  hydrOXYzine (ATARAX/VISTARIL) 25 MG tablet Take 1 tablet (25 mg total) by mouth 3 (three) times daily as needed. Patient taking differently: Take 25 mg by mouth 3 (three) times daily as needed for anxiety.  05/28/19  Yes Charlott Rakes, MD  isosorbide mononitrate (IMDUR) 60 MG 24 hr tablet Take 1 tablet (60 mg total) by mouth daily. 12/04/19  Yes Sherren Mocha, MD  Multiple Vitamins-Minerals (CENTRUM MEN PO) Take 1 tablet by mouth daily.    Yes [provider]  nitroGLYCERIN (NITROSTAT) 0.4 MG SL tablet Place 1 tablet (0.4 mg total) under the tongue every 5 (five) minutes x 3 doses as needed for chest pain. 12/10/18  Yes Charlott Rakes, MD  omeprazole (PRILOSEC) 40 MG capsule Take 1 capsule (40 mg total) by mouth daily. 05/28/19  Yes Newlin, Charlane Ferretti, MD  fluticasone (FLONASE) 50 MCG/ACT nasal spray Place 1 spray into both nostrils daily for 14 days. Patient not taking: Reported on 01/29/2020 10/09/19 01/07/21  Avegno, Darrelyn Hillock, FNP  Na Sulfate-K Sulfate-Mg Sulf 17.5-3.13-1.6 GM/177ML SOLN At 5 PM the day before procedure take 1 bottle and 5  hours before procedure take 1 bottle. 01/29/20   Virgel Manifold, MD    Family History  Problem Relation Age of Onset  . Osteoarthritis Mother   . Heart disease Mother   . Hypertension Mother   . Depression Mother   . Heart disease Father   . Early death Father   . Hypertension Father   . Heart attack Father   . Hypertension Sister   . Prostate cancer Neg Hx   . Bladder Cancer Neg Hx   . Kidney cancer Neg Hx      Social History   Tobacco Use  . Smoking status: Current Some Day Smoker    Packs/day: 0.50    Years: 32.00    Pack years: 16.00    Types: Cigarettes  . Smokeless tobacco: Never Used  . Tobacco comment: 4-5 ciggs per day  Vaping Use  .  Vaping Use: Never used  Substance Use Topics  . Alcohol use: Yes    Alcohol/week: 6.0 standard drinks    Types: 6 Cans of beer per week    Comment: occassionally   . Drug use: Yes    Types: Cocaine, Marijuana    Allergies as of 01/29/2020 - Review Complete 01/29/2020  Allergen Reaction Noted  . Latuda [lurasidone hcl] Other (See Comments) 01/14/2014  . Naproxen Hives and Swelling 11/04/2011  . Saphris [asenapine] Other (See Comments) 01/17/2014    Review of Systems:    All systems reviewed and negative except where noted in HPI.   Physical Exam:  BP 99/60   Pulse 71   Temp 98.4 F (36.9 C) (Oral)   Ht 6\' 3"  (1.905 m)   Wt 218 lb (98.9 kg)   BMI 27.25 kg/m  No LMP for male patient. Psych:  Alert and cooperative. Normal mood and affect. General:   Alert,  Well-developed, well-nourished, pleasant and cooperative in NAD Head:  Normocephalic and atraumatic. Eyes:  Sclera clear, no icterus.   Conjunctiva pink. Ears:  Normal auditory acuity. Nose:  No deformity, discharge, or lesions. Mouth:  No deformity or lesions,oropharynx pink & moist. Neck:  Supple; no masses or thyromegaly. Abdomen:  Normal bowel sounds.  No bruits.  Soft, non-tender and non-distended without masses, hepatosplenomegaly or hernias noted.  No  guarding or rebound tenderness.    Msk:  Symmetrical without gross deformities. Good, equal movement & strength bilaterally. Pulses:  Normal pulses noted. Extremities:  No clubbing or edema.  No cyanosis. Neurologic:  Alert and oriented x3;  grossly normal neurologically. Skin:  Intact without significant lesions or rashes. No jaundice. Lymph Nodes:  No significant cervical adenopathy. Psych:  Alert and cooperative. Normal mood and affect.   Labs: CBC    Component Value Date/Time   WBC 9.7 12/02/2019 0924   RBC 4.81 12/02/2019 0924   HGB 15.2 12/02/2019 0924   HGB 14.7 04/02/2018 1111   HCT 45.7 12/02/2019 0924   HCT 41.9 04/02/2018 1111   PLT 218 12/02/2019 0924   PLT 186 04/02/2018 1111   MCV 95.0 12/02/2019 0924   MCV 98 (H) 04/02/2018 1111   MCH 31.6 12/02/2019 0924   MCHC 33.3 12/02/2019 0924   RDW 13.0 12/02/2019 0924   RDW 12.9 04/02/2018 1111   LYMPHSABS 2.6 12/02/2019 0924   MONOABS 0.6 12/02/2019 0924   EOSABS 0.3 12/02/2019 0924   BASOSABS 0.0 12/02/2019 0924    Imaging Studies: No results found.  Assessment and Plan:   Keith Gibbs is a 59 y.o. y/o male has been referred for lower abdominal pain  Abdominal pain consistent with constipation, as it occurs right before bowel movement and resolved after a bowel movement  High-fiber diet MiraLAX daily with goal of 1-2 soft bowel movements daily.  If not at goal, patient instructed to increase dose to twice daily.  If loose stools with the medication, patient asked to decrease the medication to every other day, or half dose daily.  Patient verbalized understanding  Colonoscopy indicated for polyp surveillance  EGD indicated for variceal surveillance  No evidence of decompensation of cirrhosis at this time  Right upper quadrant ultrasound due for [facility] screening Repeat liver enzymes labs at this time   Dr Keith Gibbs  Speech recognition software was used to dictate the above note.

## 2020-01-31 ENCOUNTER — Other Ambulatory Visit: Payer: Self-pay | Admitting: Nurse Practitioner

## 2020-01-31 DIAGNOSIS — G8929 Other chronic pain: Secondary | ICD-10-CM

## 2020-02-04 ENCOUNTER — Ambulatory Visit: Payer: Medicaid Other

## 2020-02-10 ENCOUNTER — Ambulatory Visit
Admission: RE | Admit: 2020-02-10 | Discharge: 2020-02-10 | Disposition: A | Discharge status: Home / Self Care | Payer: Medicaid Other | Source: Ambulatory Visit | Attending: Gastroenterology | Admitting: Gastroenterology

## 2020-02-10 ENCOUNTER — Other Ambulatory Visit: Payer: Self-pay

## 2020-02-10 DIAGNOSIS — K7469 Other cirrhosis of liver: Secondary | ICD-10-CM | POA: Insufficient documentation

## 2020-02-11 ENCOUNTER — Other Ambulatory Visit: Admission: RE | Admit: 2020-02-11 | Payer: Medicaid Other | Source: Ambulatory Visit

## 2020-02-11 ENCOUNTER — Other Ambulatory Visit (HOSPITAL_COMMUNITY)
Admission: RE | Admit: 2020-02-11 | Discharge: 2020-02-11 | Disposition: A | Discharge status: Home / Self Care | Payer: Medicaid Other | Source: Ambulatory Visit | Attending: Gastroenterology | Admitting: Gastroenterology

## 2020-02-11 DIAGNOSIS — Z20822 Contact with and (suspected) exposure to covid-19: Secondary | ICD-10-CM | POA: Diagnosis not present

## 2020-02-11 DIAGNOSIS — Z01812 Encounter for preprocedural laboratory examination: Secondary | ICD-10-CM | POA: Insufficient documentation

## 2020-02-11 LAB — SARS CORONAVIRUS 2 (TAT 6-24 HRS): SARS Coronavirus 2: NEGATIVE

## 2020-02-13 ENCOUNTER — Ambulatory Visit
Admission: RE | Admit: 2020-02-13 | Discharge: 2020-02-13 | Disposition: A | Discharge status: Home / Self Care | Payer: Medicaid Other | Attending: Gastroenterology | Admitting: Gastroenterology

## 2020-02-13 ENCOUNTER — Other Ambulatory Visit: Payer: Self-pay

## 2020-02-13 ENCOUNTER — Ambulatory Visit: Payer: Medicaid Other | Admitting: Certified Registered"

## 2020-02-13 ENCOUNTER — Encounter
Admission: RE | Disposition: A | Discharge status: Home / Self Care | Payer: Self-pay | Source: Home / Self Care | Attending: Gastroenterology

## 2020-02-13 ENCOUNTER — Encounter: Payer: Self-pay | Admitting: Gastroenterology

## 2020-02-13 DIAGNOSIS — Z1211 Encounter for screening for malignant neoplasm of colon: Secondary | ICD-10-CM | POA: Insufficient documentation

## 2020-02-13 DIAGNOSIS — K7469 Other cirrhosis of liver: Secondary | ICD-10-CM

## 2020-02-13 DIAGNOSIS — Z5309 Procedure and treatment not carried out because of other contraindication: Secondary | ICD-10-CM | POA: Diagnosis not present

## 2020-02-13 HISTORY — PX: COLONOSCOPY WITH PROPOFOL: SHX5780

## 2020-02-13 HISTORY — PX: ESOPHAGOGASTRODUODENOSCOPY (EGD) WITH PROPOFOL: SHX5813

## 2020-02-13 LAB — URINE DRUG SCREEN, QUALITATIVE (ARMC ONLY)
Amphetamines, Ur Screen: NOT DETECTED
Barbiturates, Ur Screen: NOT DETECTED
Benzodiazepine, Ur Scrn: NOT DETECTED
Cannabinoid 50 Ng, Ur ~~LOC~~: NOT DETECTED
Cocaine Metabolite,Ur ~~LOC~~: POSITIVE — AB
MDMA (Ecstasy)Ur Screen: NOT DETECTED
Methadone Scn, Ur: NOT DETECTED
Opiate, Ur Screen: NOT DETECTED
Phencyclidine (PCP) Ur S: NOT DETECTED
Tricyclic, Ur Screen: POSITIVE — AB

## 2020-02-13 SURGERY — ESOPHAGOGASTRODUODENOSCOPY (EGD) WITH PROPOFOL
Anesthesia: General

## 2020-02-13 MED ORDER — PROPOFOL 500 MG/50ML IV EMUL
INTRAVENOUS | Status: AC
  Filled 2020-02-13: qty 50

## 2020-02-13 MED ORDER — SODIUM CHLORIDE 0.9 % IV SOLN: INTRAVENOUS | Status: DC

## 2020-02-13 NOTE — Anesthesia Preprocedure Evaluation (Deleted)
Anesthesia Evaluation  Patient identified by MRN, date of birth, ID band Patient awake    Reviewed: Allergy & Precautions, NPO status , Patient's Chart, lab work & pertinent test results  History of Anesthesia Complications Negative for: history of anesthetic complications  Airway Mallampati: II  TM Distance: >3 FB Neck ROM: Full    Dental  (+) Edentulous Upper, Edentulous Lower   Pulmonary neg sleep apnea, COPD,  COPD inhaler, Current Smoker and Patient abstained from smoking.,    breath sounds clear to auscultation- rhonchi (-) wheezing      Cardiovascular hypertension, Pt. on medications (-) angina+ CAD, + Past MI and + Cardiac Stents (05/2016)   Rhythm:Regular Rate:Normal - Systolic murmurs and - Diastolic murmurs 68/1157 cath  Prox LAD to Mid LAD lesion, 90 %stenosed.  LVEF 60% with high grade lesion mid LAD and no significant LCX/RCA. PCI with DES.  A STENT XIENCE ALPINE RX 2.5X18 drug eluting stent was successfully placed, and does not overlap previously placed stent.  Mid LAD lesion, 90 %stenosed.  Post intervention, there is a 0% residual stenosis.  Successful PCI and stent of mid LAD lesion with the DES stent 2.5 x 18 mm xience to 14 atm  02/2018 nuclear stress Moderate inferior reversible defect.    02/2018 echo Normal LV systolic dysfunction LVEF 60%, mild inferior hypokinesis, grade I DDX, mild to mod AI  11/2019 cath  Previously placed Mid LAD drug eluting stent is widely patent.  Balloon angioplasty was performed.  Prox LAD to Mid LAD lesion is 60% stenosed.  The left ventricular systolic function is normal.  LV end diastolic pressure is normal.  The left ventricular ejection fraction is greater than 65% by visual estimate.  1. Single vessel CAD with moderate in-stent restenosis in the LAD, negative pressure wire evaluation 2. Patent left main, LCx, and RCA without significant stenosis 3.  Normal LV function, LVEF 65%   Neuro/Psych PSYCHIATRIC DISORDERS Anxiety Depression Bipolar Disorder Schizophrenia CVA, No Residual Symptoms    GI/Hepatic GERD  ,(+) Hepatitis -, C  Endo/Other  negative endocrine ROSneg diabetes  Renal/GU negative Renal ROS     Musculoskeletal  (+) Arthritis ,   Abdominal (+) - obese,   Peds  Hematology negative hematology ROS (+)   Anesthesia Other Findings Past Medical History: No date: Anxiety No date: Arthritis No date: Bursitis No date: COPD (chronic obstructive pulmonary disease) ([facility]) No date: Depression No date: GERD (gastroesophageal reflux disease) 2012: Hepatitis C     Comment:  No longer has Hep C No date: Hypertension No date: MI (myocardial infarction) ([place]) No date: Stroke [facility])   Reproductive/Obstetrics                             Anesthesia Physical  Anesthesia Plan  ASA: III  Anesthesia Plan: General   Post-op Pain Management:    Induction: Intravenous  PONV Risk Score and Plan: 3 and Ondansetron, Propofol infusion and TIVA  Airway Management Planned: Nasal Cannula  Additional Equipment: None  Intra-op Plan:   Post-operative Plan:   Informed Consent: I have reviewed the patients History and Physical, chart, labs and discussed the procedure including the risks, benefits and alternatives for the proposed anesthesia with the patient or authorized representative who has indicated his/her understanding and acceptance.     Dental advisory given  Plan Discussed with: CRNA and Surgeon  Anesthesia Plan Comments: (Discussed risks of anesthesia with patient, including possibility of difficulty  with spontaneous ventilation under anesthesia necessitating airway intervention, PONV, and rare risks such as cardiac or respiratory or neurological events. Patient understands.)        Anesthesia Quick Evaluation

## 2020-02-13 NOTE — H&P (Signed)
Vonda Antigua, MD [address], [place], [place], [place], [id number], [place], [place], [place], [address] Phone: [phone]  Fax: [phone]  Primary Care Physician:  Doreen Beam, FNP   Pre-Procedure History & Physical: HPI:  Keith Gibbs is a 59 y.o. adult is here for a colonoscopy and EGD.   Past Medical History:  Diagnosis Date  . Anxiety   . Arthritis   . Bursitis   . Chronic pain   . COPD (chronic obstructive pulmonary disease) ([place])   . Depression   . GERD (gastroesophageal reflux disease)   . Hepatitis C 2012   No longer has Hep C  . Hypertension   . Lung disease   . MI (myocardial infarction) ([place])   . Stroke ([place])   . Substance abuse [facility])     Past Surgical History:  Procedure Laterality Date  . ABDOMINAL SURGERY     removed small piece of intestines due to [facility] Diverticulosis  . APPENDECTOMY    . BACK SURGERY    . CARDIAC CATHETERIZATION Left 06/10/2016   Procedure: Left Heart Cath and Coronary Angiography;  Surgeon: Dionisio David, MD;  Location: [place] CV LAB;  Service: Cardiovascular;  Laterality: Left;  . CARDIAC CATHETERIZATION N/A 06/10/2016   Procedure: Coronary Stent Intervention;  Surgeon: Yolonda Kida, MD;  Location: [place] [facility];  Service: Cardiovascular;  Laterality: N/A;  . COLONOSCOPY    . COLONOSCOPY WITH PROPOFOL N/A 01/05/2017   Procedure: COLONOSCOPY WITH PROPOFOL;  Surgeon: Jonathon Bellows, MD;  [facility];  Service: Endoscopy;  Laterality: N/A;  . CORONARY ANGIOPLASTY WITH STENT PLACEMENT    . ESOPHAGOGASTRODUODENOSCOPY (EGD) WITH PROPOFOL N/A 01/05/2017   Procedure: ESOPHAGOGASTRODUODENOSCOPY (EGD) WITH PROPOFOL;  Surgeon: Jonathon Bellows, MD;  Location: [facility] ENDOSCOPY;  Service: Endoscopy;  Laterality: N/A;  . INTRAVASCULAR PRESSURE WIRE/FFR STUDY N/A 12/04/2019   Procedure: INTRAVASCULAR PRESSURE WIRE/FFR STUDY;  Surgeon: Sherren Mocha, MD;  Location: [place] CV LAB;   Service: Cardiovascular;  Laterality: N/A;  . KNEE ARTHROSCOPY WITH MEDIAL MENISECTOMY Right 09/05/2017   Procedure: KNEE ARTHROSCOPY WITH MEDIAL AND LATERAL  MENISECTOMY PARTIAL SYNOVECTOMY;  Surgeon: Hessie Knows, MD;  Location: [facility] ORS;  Service: Orthopedics;  Laterality: Right;  . LEFT HEART CATH AND CORONARY ANGIOGRAPHY N/A 12/04/2019   Procedure: LEFT HEART CATH AND CORONARY ANGIOGRAPHY;  Surgeon: Sherren Mocha, MD;  Location: [place] [facility];  Service: Cardiovascular;  Laterality: N/A;  . SHOULDER SURGERY Right 04/09/2012  . SPINE SURGERY      Prior to Admission medications   Medication Sig Start Date End Date Taking? Authorizing Provider  aspirin EC 81 MG tablet Take 81 mg by mouth daily.   Yes [provider]  atorvastatin (LIPITOR) 40 MG tablet Take 1 tablet (40 mg total) by mouth daily at 6 PM. Patient taking differently: Take 40 mg by mouth at bedtime.  12/30/16  Yes Pucilowska, Jolanta B, MD  Cetirizine HCl (ZYRTEC ALLERGY) 10 MG CAPS Take 1 capsule (10 mg total) by mouth 1 day or 1 dose for 30 doses. Patient taking differently: Take 10 mg by mouth daily.  10/09/19 01/07/21 Yes Avegno, Darrelyn Hillock, FNP  DULERA 200-5 MCG/ACT AERO TAKE 2 PUFFS BY MOUTH TWICE A DAY Patient taking differently: Inhale 2 puffs into the lungs 2 (two) times daily.  05/28/19  Yes Charlott Rakes, MD  DULoxetine (CYMBALTA) 60 MG capsule Take 1 capsule (60 mg total) by mouth daily. 05/28/19  Yes Charlott Rakes, MD  fluticasone (FLONASE) 50 MCG/ACT nasal spray Place 1 spray into both nostrils daily for 14 days. 10/09/19 01/07/21 Yes Avegno, Darrelyn Hillock, FNP  gabapentin (NEURONTIN) 300 MG capsule TAKE (1) CAPSULE BY MOUTH TWICE DAILY. 01/30/20  Yes Charlott Rakes, MD  hydrOXYzine (ATARAX/VISTARIL) 25 MG tablet TAKE 1 TABLET BY MOUTH 3 TIMES DAILY AS NEEDED. 01/30/20  Yes Charlott Rakes, MD  isosorbide mononitrate (IMDUR) 60 MG 24 hr tablet Take 1 tablet (60 mg total) by mouth daily. 12/04/19  Yes Sherren Mocha, MD  Multiple Vitamins-Minerals (CENTRUM MEN PO) Take 1 tablet by mouth daily.    Yes [provider]  Na Sulfate-K Sulfate-Mg Sulf 17.5-3.13-1.6 GM/177ML SOLN At 5 PM the day before procedure take 1 bottle and 5 hours before procedure take 1 bottle. 01/29/20  Yes Sarenity Ramaker, Lennette Bihari, MD  albuterol (VENTOLIN HFA) 108 (90 Base) MCG/ACT inhaler Inhale 2 puffs into the lungs every 6 (six) hours as needed for wheezing or shortness of breath. 05/28/19   Charlott Rakes, MD  nitroGLYCERIN (NITROSTAT) 0.4 MG SL tablet Place 1 tablet (0.4 mg total) under the tongue every 5 (five) minutes x 3 doses as needed for chest pain. 12/10/18   Charlott Rakes, MD  omeprazole (PRILOSEC) 40 MG capsule Take 1 capsule (40 mg total) by mouth daily. 05/28/19   Charlott Rakes, MD    Allergies as of 01/29/2020 - Review Complete 01/29/2020  Allergen Reaction Noted  . Latuda [lurasidone hcl] Other (See Comments) 01/14/2014  . Naproxen Hives and Swelling 11/04/2011  . Saphris [asenapine] Other (See Comments) 01/17/2014    Family History  Problem Relation Age of Onset  . Osteoarthritis Mother   . Heart disease Mother   . Hypertension Mother   . Depression Mother   . Heart disease Father   . Early death Father   . Hypertension Father   . Heart attack Father   . Hypertension Sister   . Prostate cancer Neg Hx   . Bladder Cancer Neg Hx   . Kidney cancer Neg Hx     Social History   Socioeconomic History  . Marital status: Widowed    Spouse name: Not on file  . Number of children: Not on file  . Years of education: Not on file  . Highest education level: Not on file  Occupational History  . Occupation: disability  . Occupation: stocking at gas station    Comment: 2 days per week  Tobacco Use  . Smoking status: Current Some Day Smoker    Packs/day: 0.50    Years: 32.00    Pack years: 16.00    Types: Cigarettes  . Smokeless tobacco: Never Used  . Tobacco comment: 4-5 ciggs per day  Vaping  Use  . Vaping Use: Never used  Substance and Sexual Activity  . Alcohol use: Yes    Alcohol/week: 6.0 standard drinks    Types: 6 Cans of beer per week    Comment: occassionally   . Drug use: Yes    Types: Cocaine, Marijuana    Comment: last on saturday  . Sexual activity: Yes    Partners: Female    Birth control/protection: None  Other Topics Concern  . Not on file  Social History Narrative  . Not on file   Social Determinants of Health   Financial Resource Strain:   . Difficulty of Paying Living Expenses:   Food Insecurity:   . Worried About Charity fundraiser in the Last Year:   . [employer] of  Food in the Last Year:   Transportation Needs:   . Film/video editor (Medical):   Marland Kitchen Lack of Transportation (Non-Medical):   Physical Activity:   . Days of Exercise per Week:   . Minutes of Exercise per Session:   Stress:   . Feeling of Stress :   Social Connections:   . Frequency of Communication with Friends and Family:   . Frequency of Social Gatherings with Friends and Family:   . Attends Religious Services:   . Active Member of Clubs or Organizations:   . Attends Archivist Meetings:   Marland Kitchen Marital Status:   Intimate Partner Violence:   . Fear of Current or Ex-Partner:   . Emotionally Abused:   Marland Kitchen Physically Abused:   . Sexually Abused:     Review of Systems: See HPI, otherwise negative ROS  Physical Exam: There were no vitals taken for this visit. General:   Alert,  pleasant and cooperative in NAD Head:  Normocephalic and atraumatic. Neck:  Supple; no masses or thyromegaly. Lungs:  Clear throughout to auscultation, normal respiratory effort.    Heart:  +S1, +S2, Regular rate and rhythm, No edema. Abdomen:  Soft, nontender and nondistended. Normal bowel sounds, without guarding, and without rebound.   Neurologic:  Alert and  oriented x4;  grossly normal neurologically.  Impression/Plan: Keith Gibbs is here for a colonoscopy to be performed for  polyp surveillance and EGD for variceal screening.  Risks, benefits, limitations, and alternatives regarding the procedures have been reviewed with the patient.  Questions have been answered.  All parties agreeable.   Virgel Manifold, MD  02/13/2020, 9:16 AM

## 2020-02-14 ENCOUNTER — Encounter: Payer: Self-pay | Admitting: Gastroenterology

## 2020-02-20 ENCOUNTER — Institutional Professional Consult (permissible substitution): Payer: Medicaid Other | Admitting: Internal Medicine

## 2020-02-20 ENCOUNTER — Other Ambulatory Visit: Payer: Self-pay

## 2020-02-20 ENCOUNTER — Telehealth: Payer: Self-pay

## 2020-02-20 DIAGNOSIS — K7469 Other cirrhosis of liver: Secondary | ICD-10-CM

## 2020-02-20 DIAGNOSIS — K5900 Constipation, unspecified: Secondary | ICD-10-CM

## 2020-02-20 MED ORDER — NA SULFATE-K SULFATE-MG SULF 17.5-3.13-1.6 GM/177ML PO SOLN: ORAL | 0 refills | Status: DC

## 2020-02-20 NOTE — Telephone Encounter (Signed)
Patient called wanting to reschedule his EGD and colonoscopy on 03/18/2020 at [facility]. I told him that I would send his prescription in and schedule his COVID-19 test at [facility].

## 2020-02-25 ENCOUNTER — Other Ambulatory Visit: Payer: Self-pay | Admitting: Family Medicine

## 2020-02-25 DIAGNOSIS — M5136 Other intervertebral disc degeneration, lumbar region: Secondary | ICD-10-CM

## 2020-02-25 DIAGNOSIS — F32A Depression, unspecified: Secondary | ICD-10-CM

## 2020-02-25 DIAGNOSIS — F329 Major depressive disorder, single episode, unspecified: Secondary | ICD-10-CM

## 2020-02-25 NOTE — Telephone Encounter (Signed)
Requested medication (s) are due for refill today yes  Requested medication (s) are on the active medication list -yes  Future visit scheduled -no  Last refill: 01/30/20  Notes to clinic: Request for medictaions filled by outside provider- sent for review and Rx update with new PCP  Requested Prescriptions  Pending Prescriptions Disp Refills   hydrOXYzine (ATARAX/VISTARIL) 25 MG tablet [Pharmacy Med Name: HYDROXYZINE HCL 25MG  TABLET] 90 tablet 0    Sig: TAKE 1 TABLET BY MOUTH 3 TIMES DAILY AS NEEDED.      Ear, Nose, and Throat:  Antihistamines Passed - 02/25/2020 11:05 AM      Passed - Valid encounter within last 12 months    Recent Outpatient Visits           2 months ago Chronic hepatitis C without hepatic coma ([place])   [place] Flinchum, Kelby Aline, FNP   3 months ago Encounter for routine adult health examination without abnormal findings   [facility], Kelby Aline, FNP   9 months ago [place], Charlane Ferretti, MD   1 year ago DDD (degenerative disc disease), lumbar   [place], Charlane Ferretti, MD   1 year ago Chronic hepatitis C without hepatic coma [facility])   [facility], Dionne Bucy, MD       Future Appointments             In 4 months Branch, Alphonse Guild, MD [facility] [place], [place] H              gabapentin (NEURONTIN) 300 MG capsule [Pharmacy Med Name: GABAPENTIN 300MG  CAPSULE] 60 capsule 0    Sig: TAKE (1) CAPSULE BY MOUTH TWICE DAILY.      Neurology: Anticonvulsants - gabapentin Passed - 02/25/2020 11:05 AM      Passed - Valid encounter within last 12 months    Recent Outpatient Visits           2 months ago Chronic hepatitis C without hepatic coma ([place])   [place] Flinchum, Kelby Aline, FNP   3 months ago Encounter for routine adult health examination without abnormal findings    [facility], Kelby Aline, FNP   9 months ago [place], Charlane Ferretti, MD   1 year ago DDD (degenerative disc disease), lumbar   [place], Charlane Ferretti, MD   1 year ago Chronic hepatitis C without hepatic coma [facility])   [facility], Dionne Bucy, MD       Future Appointments             In 4 months Branch, Alphonse Guild, MD [facility] [place], [place] H                Requested Prescriptions  Pending Prescriptions Disp Refills   hydrOXYzine (ATARAX/VISTARIL) 25 MG tablet [Pharmacy Med Name: HYDROXYZINE HCL 25MG  TABLET] 90 tablet 0    Sig: TAKE 1 TABLET BY MOUTH 3 TIMES DAILY AS NEEDED.      Ear, Nose, and Throat:  Antihistamines Passed - 02/25/2020 11:05 AM      Passed - Valid encounter within last 12 months    Recent Outpatient Visits           2 months ago Chronic hepatitis C without hepatic coma [facility])   [facility], [place]  S, FNP   3 months ago Encounter for routine adult health examination without abnormal findings   [employer] Flinchum, Kelby Aline, FNP   9 months ago [place], Charlane Ferretti, MD   1 year ago DDD (degenerative disc disease), lumbar   [place], Charlane Ferretti, MD   1 year ago Chronic hepatitis C without hepatic coma [facility])   [facility], Dionne Bucy, MD       Future Appointments             In 4 months Branch, Alphonse Guild, MD [facility] [place],  H              gabapentin (NEURONTIN) 300 MG capsule [Pharmacy Med Name: GABAPENTIN 300MG  CAPSULE] 60 capsule 0    Sig: TAKE (1) CAPSULE BY MOUTH TWICE DAILY.      Neurology: Anticonvulsants - gabapentin Passed - 02/25/2020 11:05 AM      Passed - Valid encounter within last 12 months    Recent  Outpatient Visits           2 months ago Chronic hepatitis C without hepatic coma ([place])   [place] Flinchum, Kelby Aline, FNP   3 months ago Encounter for routine adult health examination without abnormal findings   [facility], Kelby Aline, FNP   9 months ago [place], [place], MD   1 year ago DDD (degenerative disc disease), lumbar   [place], Charlane Ferretti, MD   1 year ago Chronic hepatitis C without hepatic coma [facility])   [facility], Dionne Bucy, MD       Future Appointments             In 4 months Branch, Alphonse Guild, MD [place], [place]

## 2020-02-27 ENCOUNTER — Ambulatory Visit (HOSPITAL_COMMUNITY): Payer: Medicaid Other

## 2020-03-10 ENCOUNTER — Encounter (HOSPITAL_COMMUNITY): Payer: Self-pay

## 2020-03-10 ENCOUNTER — Ambulatory Visit (HOSPITAL_COMMUNITY): Admission: RE | Admit: 2020-03-10 | Payer: Medicaid Other | Source: Ambulatory Visit

## 2020-03-16 ENCOUNTER — Other Ambulatory Visit (HOSPITAL_COMMUNITY)
Admission: RE | Admit: 2020-03-16 | Discharge: 2020-03-16 | Disposition: A | Discharge status: Home / Self Care | Payer: Medicaid Other | Source: Ambulatory Visit | Attending: Gastroenterology | Admitting: Gastroenterology

## 2020-03-16 ENCOUNTER — Other Ambulatory Visit: Payer: Medicaid Other | Attending: Gastroenterology

## 2020-03-16 NOTE — Progress Notes (Signed)
Patient did not show for his Covid testing appointment. Called patient to ask him if he could come today before 4:00. If not to please call me . Waiting to see or hear from pt.Darrick Grinder was kind enough to let me know patient was scheduled for a lab at 2 different places. He must have went to the other lab.  I'll leave encounter open for now, incase patient calls. Patient didn't call, closing encounter.

## 2020-03-18 ENCOUNTER — Encounter: Admission: RE | Payer: Self-pay | Source: Home / Self Care

## 2020-03-18 ENCOUNTER — Ambulatory Visit: Admission: RE | Admit: 2020-03-18 | Payer: Medicaid Other | Source: Home / Self Care | Admitting: Gastroenterology

## 2020-03-18 SURGERY — ESOPHAGOGASTRODUODENOSCOPY (EGD) WITH PROPOFOL
Anesthesia: General

## 2020-04-23 ENCOUNTER — Other Ambulatory Visit: Payer: Self-pay | Admitting: Family Medicine

## 2020-04-23 NOTE — Telephone Encounter (Signed)
Requested medication (s) are due for refill today- expired rx  Requested medication (s) are on the active medication list -yes  Future visit scheduled -no  Last refill: 04/01/20  Notes to clinic: Request RF- outside provider- non formulary  Requested Prescriptions  Pending Prescriptions Disp Refills   cetirizine (ZYRTEC) 10 MG tablet [Pharmacy Med Name: CETIRIZINE HCL 10MG  TABLET] 30 tablet 0    Sig: TAKE 1 TABLETS DAILY.      Ear, Nose, and Throat:  Antihistamines Passed - 04/23/2020  2:19 PM      Passed - Valid encounter within last 12 months    Recent Outpatient Visits           4 months ago Chronic hepatitis C without hepatic coma ([place])   [place] Flinchum, Kelby Aline, FNP   5 months ago Encounter for routine adult health examination without abnormal findings   [facility], Kelby Aline, FNP   11 months ago [place], Charlane Ferretti, MD   1 year ago DDD (degenerative disc disease), lumbar   [place], MD   2 years ago Chronic hepatitis C without hepatic coma [facility])   [facility], Dionne Bucy, MD       Future Appointments             In 2 months Branch, Alphonse Guild, MD [facility] [place], [place] H                Requested Prescriptions  Pending Prescriptions Disp Refills   cetirizine (ZYRTEC) 10 MG tablet [Pharmacy Med Name: CETIRIZINE HCL 10MG  TABLET] 30 tablet 0    Sig: TAKE 1 TABLETS DAILY.      Ear, Nose, and Throat:  Antihistamines Passed - 04/23/2020  2:19 PM      Passed - Valid encounter within last 12 months    Recent Outpatient Visits           4 months ago Chronic hepatitis C without hepatic coma ([place])   [place] Flinchum, Kelby Aline, FNP   5 months ago Encounter for routine adult health examination without abnormal findings   [employer], Kelby Aline, FNP   11 months ago [place], Charlane Ferretti, MD   1 year ago DDD (degenerative disc disease), lumbar   [place], MD   2 years ago Chronic hepatitis C without hepatic coma [facility])   [facility], Dionne Bucy, MD       Future Appointments             In 2 months Branch, Alphonse Guild, MD [place], [place]

## 2020-04-24 ENCOUNTER — Other Ambulatory Visit: Payer: Self-pay | Admitting: Family Medicine

## 2020-04-24 DIAGNOSIS — K219 Gastro-esophageal reflux disease without esophagitis: Secondary | ICD-10-CM

## 2020-04-24 NOTE — Telephone Encounter (Signed)
Requested medication (s) are due for refill today- yes  Requested medication (s) are on the active medication list -yes  Future visit scheduled -no  Last refill: 04/01/20  Notes to clinic: Request RF of RX prescribed by previous provider  Requested Prescriptions  Pending Prescriptions Disp Refills   omeprazole (PRILOSEC) 40 MG capsule [Pharmacy Med Name: OMEPRAZOLE DR 40 MG CAPSULE] 30 capsule 0    Sig: TAKE ONE CAPSULE BY MOUTH ONCE DAILY.      Gastroenterology: Proton Pump Inhibitors Passed - 04/24/2020 10:50 AM      Passed - Valid encounter within last 12 months    Recent Outpatient Visits           4 months ago Chronic hepatitis C without hepatic coma ([place])   [place] Flinchum, Kelby Aline, FNP   5 months ago Encounter for routine adult health examination without abnormal findings   [facility], Kelby Aline, FNP   11 months ago [place], Charlane Ferretti, MD   1 year ago DDD (degenerative disc disease), lumbar   [place], MD   2 years ago Chronic hepatitis C without hepatic coma [facility])   [facility], Dionne Bucy, MD       Future Appointments             In 2 months Branch, Alphonse Guild, MD [facility] [place], [place] H                Requested Prescriptions  Pending Prescriptions Disp Refills   omeprazole (PRILOSEC) 40 MG capsule [Pharmacy Med Name: OMEPRAZOLE DR 40 MG CAPSULE] 30 capsule 0    Sig: TAKE ONE CAPSULE BY MOUTH ONCE DAILY.      Gastroenterology: Proton Pump Inhibitors Passed - 04/24/2020 10:50 AM      Passed - Valid encounter within last 12 months    Recent Outpatient Visits           4 months ago Chronic hepatitis C without hepatic coma ([place])   [place] Flinchum, Kelby Aline, FNP   5 months ago Encounter for routine adult health examination without abnormal  findings   [facility], Kelby Aline, FNP   11 months ago [place], Charlane Ferretti, MD   1 year ago DDD (degenerative disc disease), lumbar   [place], MD   2 years ago Chronic hepatitis C without hepatic coma [facility])   [facility], Dionne Bucy, MD       Future Appointments             In 2 months [place], Alphonse Guild, MD [place], [place] H

## 2020-06-23 NOTE — Progress Notes (Deleted)
Cardiology Office Note    Date:  06/23/2020   ID:  Keith Gibbs, DOB [birth year], MRN [id number]  PCP:  Doreen Beam, FNP  Cardiologist: Carlyle Dolly, MD EPS: None  No chief complaint on file.   History of Present Illness:  Keith Gibbs is a 59 y.o. adult with history of CAD coronary CTA 2017 showed 90% mid LAD underwent subsequent DES to the LAD, NST 02/2018 moderate inferior reversible defect, cath 11/2019 patent LAD stent, proximal to mid LAD 60% not significant by FFR,Aortic regurgitation mild to moderate on echo in 2019, History of palpitations,History of substance abuse EtOH opiate and cocaine in the past     Past Medical History:  Diagnosis Date  . Anxiety   . Arthritis   . Bursitis   . Chronic pain   . COPD (chronic obstructive pulmonary disease) ([place])   . Depression   . GERD (gastroesophageal reflux disease)   . Hepatitis C 2012   No longer has Hep C  . Hypertension   . Lung disease   . MI (myocardial infarction) ([place])   . Stroke ([place])   . Substance abuse [facility])     Past Surgical History:  Procedure Laterality Date  . ABDOMINAL SURGERY     removed small piece of intestines due to [facility] Diverticulosis  . APPENDECTOMY    . BACK SURGERY    . CARDIAC CATHETERIZATION Left 06/10/2016   Procedure: Left Heart Cath and Coronary Angiography;  Surgeon: Dionisio David, MD;  Location: [place] CV LAB;  Service: Cardiovascular;  Laterality: Left;  . CARDIAC CATHETERIZATION N/A 06/10/2016   Procedure: Coronary Stent Intervention;  Surgeon: Yolonda Kida, MD;  Location: [place] [facility] LAB;  Service: Cardiovascular;  Laterality: N/A;  . COLONOSCOPY    . COLONOSCOPY WITH PROPOFOL N/A 01/05/2017   Procedure: COLONOSCOPY WITH PROPOFOL;  Surgeon: Jonathon Bellows, MD;  Location: [facility] ENDOSCOPY;  Service: Endoscopy;  Laterality: N/A;  . COLONOSCOPY WITH PROPOFOL N/A 02/13/2020   Procedure: COLONOSCOPY WITH PROPOFOL;  Surgeon: Virgel Manifold, MD;   Location: [facility] ENDOSCOPY;  Service: Endoscopy;  Laterality: N/A;  . CORONARY ANGIOPLASTY WITH STENT PLACEMENT    . ESOPHAGOGASTRODUODENOSCOPY (EGD) WITH PROPOFOL N/A 01/05/2017   Procedure: ESOPHAGOGASTRODUODENOSCOPY (EGD) WITH PROPOFOL;  Surgeon: Jonathon Bellows, MD;  Location: [facility] ENDOSCOPY;  Service: Endoscopy;  Laterality: N/A;  . ESOPHAGOGASTRODUODENOSCOPY (EGD) WITH PROPOFOL N/A 02/13/2020   Procedure: ESOPHAGOGASTRODUODENOSCOPY (EGD) WITH PROPOFOL;  Surgeon: Virgel Manifold, MD;  Location: [facility] ENDOSCOPY;  Service: Endoscopy;  Laterality: N/A;  . INTRAVASCULAR PRESSURE WIRE/FFR STUDY N/A 12/04/2019   Procedure: INTRAVASCULAR PRESSURE WIRE/FFR STUDY;  Surgeon: Sherren Mocha, MD;  Location: [place] CV LAB;  Service: Cardiovascular;  Laterality: N/A;  . KNEE ARTHROSCOPY WITH MEDIAL MENISECTOMY Right 09/05/2017   Procedure: KNEE ARTHROSCOPY WITH MEDIAL AND LATERAL  MENISECTOMY PARTIAL SYNOVECTOMY;  Surgeon: Hessie Knows, MD;  Location: [facility] ORS;  Service: Orthopedics;  Laterality: Right;  . LEFT HEART CATH AND CORONARY ANGIOGRAPHY N/A 12/04/2019   Procedure: LEFT HEART CATH AND CORONARY ANGIOGRAPHY;  Surgeon: Sherren Mocha, MD;  Location: [place] CV LAB;  Service: Cardiovascular;  Laterality: N/A;  . SHOULDER SURGERY Right 04/09/2012  . SPINE SURGERY      Current Medications: No outpatient medications have been marked as taking for the 06/29/20 encounter (Appointment) with Imogene Burn, PA-C.   Current Facility-Administered Medications for the 06/29/20 encounter (Appointment) with Imogene Burn, PA-C  Medication  . sodium chloride flush (NS) 0.9 % injection  3 mL     Allergies:   Latuda [lurasidone hcl], Naproxen, and Saphris [asenapine]   Social History   Socioeconomic History  . Marital status: Widowed    Spouse name: Not on file  . Number of children: Not on file  . Years of education: Not on file  . Highest education level: Not on file  Occupational History  .  Occupation: disability  . Occupation: stocking at gas station    Comment: 2 days per week  Tobacco Use  . Smoking status: Current Some Day Smoker    Packs/day: 0.50    Years: 32.00    Pack years: 16.00    Types: Cigarettes  . Smokeless tobacco: Never Used  . Tobacco comment: 4-5 ciggs per day  Vaping Use  . Vaping Use: Never used  Substance and Sexual Activity  . Alcohol use: Yes    Alcohol/week: 6.0 standard drinks    Types: 6 Cans of beer per week    Comment: occassionally   . Drug use: Yes    Types: Cocaine, Marijuana    Comment: last on saturday  . Sexual activity: Yes    Partners: Female    Birth control/protection: None  Other Topics Concern  . Not on file  Social History Narrative  . Not on file   Social Determinants of Health   Financial Resource Strain:   . Difficulty of Paying Living Expenses: Not on file  Food Insecurity:   . Worried About Charity fundraiser in the Last Year: Not on file  . Ran Out of Food in the Last Year: Not on file  Transportation Needs:   . Lack of Transportation (Medical): Not on file  . Lack of Transportation (Non-Medical): Not on file  Physical Activity:   . Days of Exercise per Week: Not on file  . Minutes of Exercise per Session: Not on file  Stress:   . Feeling of Stress : Not on file  Social Connections:   . Frequency of Communication with Friends and Family: Not on file  . Frequency of Social Gatherings with Friends and Family: Not on file  . Attends Religious Services: Not on file  . Active Member of Clubs or Organizations: Not on file  . Attends Archivist Meetings: Not on file  . Marital Status: Not on file     Family History:  The patient's ***family history includes Depression in his mother; Early death in his father; Heart attack in his father; Heart disease in his father and mother; Hypertension in his father, mother, and sister; Osteoarthritis in his mother.   ROS:   Please see the history of present  illness.    ROS All other systems reviewed and are negative.   PHYSICAL EXAM:   VS:  There were no vitals taken for this visit.  Physical Exam  GEN: Well nourished, well developed, in no acute distress  HEENT: normal  Neck: no JVD, carotid bruits, or masses Cardiac:RRR; no murmurs, rubs, or gallops  Respiratory:  clear to auscultation bilaterally, normal work of breathing GI: soft, nontender, nondistended, + BS Ext: without cyanosis, clubbing, or edema, Good distal pulses bilaterally MS: no deformity or atrophy  Skin: warm and dry, no rash Neuro:  Alert and Oriented x 3, Strength and sensation are intact Psych: euthymic mood, full affect  Wt Readings from Last 3 Encounters:  02/13/20 220 lb 7.4 oz (100 kg)  01/29/20 218 lb (98.9 kg)  01/08/20 225 lb 12.8 oz (102.4 kg)  Studies/Labs Reviewed:   EKG:  EKG is*** ordered today.  The ekg ordered today demonstrates ***  Recent Labs: 12/02/2019: Hemoglobin 15.2; Platelets 218 12/12/2019: TSH 1.130 01/29/2020: ALT 15; BUN 13; Creatinine, Ser 1.10; Potassium 4.1; Sodium 135   Lipid Panel    Component Value Date/Time   CHOL 116 12/12/2019 0949   TRIG 87 12/12/2019 0949   HDL 38 (L) 12/12/2019 0949   CHOLHDL 3.1 12/12/2019 0949   CHOLHDL 1.7 12/28/2016 0651   VLDL 12 12/28/2016 0651   LDLCALC 61 12/12/2019 0949    Additional studies/ records that were reviewed today include:  Cardiac cath 12/04/2019  Previously placed Mid LAD drug eluting stent is widely patent.  Balloon angioplasty was performed.  Prox LAD to Mid LAD lesion is 60% stenosed.  The left ventricular systolic function is normal.  LV end diastolic pressure is normal.  The left ventricular ejection fraction is greater than 65% by visual estimate.   1. Single vessel CAD with moderate in-stent restenosis in the LAD, negative pressure wire evaluation 2. Patent left main, LCx, and RCA without significant stenosis 3. Normal LV function, LVEF  65%     ASSESSMENT:    1. Coronary artery disease involving native coronary artery of native heart without angina pectoris   2. Aortic valve insufficiency, etiology of cardiac valve disease unspecified   3. Palpitations   4. History of substance abuse ([place])      PLAN:  In order of problems listed above:  CAD coronary CTA 2017 showed 90% mid LAD underwent subsequent DES to the LAD, NST 02/2018 moderate inferior reversible defect, cath 11/2019 patent LAD stent, proximal to mid LAD 60% not significant by FFR  Aortic regurgitation mild to moderate on echo in 2019  History of palpitations  History of substance abuse EtOH opiate and cocaine in the past  Medication Adjustments/Labs and Tests Ordered: Current medicines are reviewed at length with the patient today.  Concerns regarding medicines are outlined above.  Medication changes, Labs and Tests ordered today are listed in the Patient Instructions below. There are no Patient Instructions on file for this visit.   Sumner Boast, PA-C  06/23/2020 3:07 PM    [facility] [address], [place], [place]  [address] Phone: [phone]; Fax: [phone]

## 2020-06-29 ENCOUNTER — Ambulatory Visit: Payer: Medicaid Other | Admitting: Cardiology

## 2020-06-29 ENCOUNTER — Ambulatory Visit: Payer: Medicaid Other | Admitting: Physician Assistant

## 2020-06-29 DIAGNOSIS — I351 Nonrheumatic aortic (valve) insufficiency: Secondary | ICD-10-CM

## 2020-06-29 DIAGNOSIS — R002 Palpitations: Secondary | ICD-10-CM

## 2020-06-29 DIAGNOSIS — F1911 Other psychoactive substance abuse, in remission: Secondary | ICD-10-CM

## 2020-06-29 DIAGNOSIS — I2510 Atherosclerotic heart disease of native coronary artery without angina pectoris: Secondary | ICD-10-CM

## 2020-07-21 ENCOUNTER — Encounter: Payer: Self-pay | Admitting: Internal Medicine

## 2020-07-21 ENCOUNTER — Ambulatory Visit (INDEPENDENT_AMBULATORY_CARE_PROVIDER_SITE_OTHER): Payer: Medicaid Other | Admitting: Internal Medicine

## 2020-07-21 ENCOUNTER — Other Ambulatory Visit: Payer: Self-pay

## 2020-07-21 DIAGNOSIS — F17210 Nicotine dependence, cigarettes, uncomplicated: Secondary | ICD-10-CM | POA: Diagnosis not present

## 2020-07-21 DIAGNOSIS — J449 Chronic obstructive pulmonary disease, unspecified: Secondary | ICD-10-CM

## 2020-07-21 MED ORDER — AZITHROMYCIN 250 MG PO TABS
ORAL_TABLET | ORAL | 0 refills | Status: DC
Start: 2020-07-21 — End: 2020-08-06

## 2020-07-21 MED ORDER — PREDNISONE 10 MG PO TABS: ORAL_TABLET | ORAL | 0 refills | Status: DC

## 2020-07-21 MED ORDER — BREZTRI AEROSPHERE 160-9-4.8 MCG/ACT IN AERO
2.0000 | INHALATION_SPRAY | Freq: Two times a day (BID) | RESPIRATORY_TRACT | 0 refills | Status: DC
Start: 2020-07-21 — End: 2020-08-06

## 2020-07-21 NOTE — Assessment & Plan Note (Signed)
Counseled re importance of smoking cessation but did not meet time criteria for separate billing    Re Ca screen Low-dose CT lung cancer screening is recommended for patients who are 6-59 years of age with a 30+ pack-year history of smoking, and who are currently smoking or quit <=15 years ago.   He is eligible but I emphasized the number of lives saved by screening ct vs not was  very slim benefit  and lots of people screened still died of cancer in the "treated group" in the study so the best way to reduce risk of cancer is not smoke, and not just to rely on this test or any other.   Will start with repeat cxr as having mild flare now and then consider enter the low dose screening program p 1st of year when   returns for f/u PFTs            Each maintenance medication was reviewed in detail including emphasizing most importantly the difference between maintenance and prns and under what circumstances the prns are to be triggered using an action plan format where appropriate.  Total time for H and P, chart review, counseling, teaching device and generating customized AVS unique to this office visit / charting = 47 min

## 2020-07-21 NOTE — Patient Instructions (Addendum)
Plan A = Automatic = Always=    Breztri Take 2 puffs first thing in am and then another 2 puffs about 12 hours later.   Work on inhaler technique:  relax and gently blow all the way out then take a nice smooth deep breath back in, triggering the inhaler at same time you start breathing in.  Hold for up to 5 seconds if you can. Blow out thru nose. Rinse and gargle with water when done   Plan B = Backup (to supplement plan A, not to replace it) Only use your albuterol inhaler as a rescue medication to be used if you can't catch your breath by resting or doing a relaxed purse lip breathing pattern.  - The less you use it, the better it will work when you need it. - Ok to use the inhaler up to 2 puffs  every 4 hours if you must but call for appointment if use goes up over your usual need - Don't leave home without it !!  (think of it like the spare tire for your car)   Zpak  Prednisone 10 mg take  4 each am x 2 days,   2 each am x 2 days,  1 each am x 2 days and stop  Please remember to go to the lab and x-ray department at [facility]   for your tests - we will call you with the results when they are available.        Please schedule a follow up office visit in 6 weeks, call sooner if needed PFTs on return

## 2020-07-21 NOTE — Progress Notes (Signed)
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 Consult    Productive cough with yellow/brown phlegm since July 2021, shortness of breath at rest and activity  Dyspnea:  [employer] slow pace ok on dulera 8 am and w/in a few hours used saba  Cough: cough x July 21 as above, wore in am's Sleep: flat bed 2 pillows / inhaler once a week SABA use: too much p activity, rarely ever at rest 02 none  No obvious day to day or other daytime variability or assoc mucus plugs or hemoptysis or cp or chest tightness, subjective wheeze or overt sinus or hb symptoms.   Sleeping  without nocturnal   exacerbation  of respiratory  c/o's or need for noct saba. Also denies any obvious fluctuation of symptoms with weather or environmental changes or other aggravating or alleviating factors except as outlined above   No unusual exposure hx or h/o childhood pna/ asthma or knowledge of premature birth.  Current Allergies, Complete Past Medical History, Past Surgical History, Family History, and Social History were reviewed in [facility] electronic medical record.  ROS  The following are not active complaints unless bolded Hoarseness, sore throat, dysphagia, dental problems, itching, sneezing,  nasal congestion or discharge of excess mucus or purulent secretions, ear ache,   fever, chills, sweats, unintended wt loss or wt gain, classically pleuritic or exertional cp,  orthopnea pnd or arm/hand swelling  or leg swelling, presyncope, palpitations, abdominal pain, anorexia, nausea, vomiting, diarrhea  or  change in bowel habits or change in bladder habits, change in stools or change in urine, dysuria, hematuria,  rash, arthralgias, visual complaints, headache, numbness, weakness or ataxia or problems with walking or coordination,  change in mood or  memory.           Past Medical History:  Diagnosis Date  . Anxiety   . Arthritis   . Bursitis   . Chronic pain   . COPD (chronic obstructive pulmonary disease) ([facility])   . Depression   . GERD (gastroesophageal reflux disease)   . Hepatitis C 2012   No longer has Hep C  . Hypertension   . Lung disease   . MI (myocardial infarction) ([facility])   . Stroke ([facility])   . Substance abuse [facility](HCC)     Outpatient Medications Prior to Visit  Medication Sig Dispense Refill  . albuterol (VENTOLIN HFA) 108 (90 Base) MCG/ACT inhaler Inhale 2 puffs into the lungs every 6 (six) hours as needed for wheezing or shortness of breath. 18 g 6  . aspirin EC 81 MG tablet Take 81 mg by mouth daily.    Marland Kitchen. atorvastatin (LIPITOR) 40 MG tablet Take 1 tablet (40 mg total) by mouth daily at 6 PM. (Patient taking differently: Take 40 mg by mouth at bedtime.) 30 tablet 1  . cetirizine (ZYRTEC) 10 MG tablet TAKE 1 TABLETS DAILY. 30 tablet 0  . clopidogrel (PLAVIX) 75 MG tablet Take 75 mg by mouth daily.    . cyclobenzaprine (FLEXERIL) 10 MG tablet Take 10  mg by mouth 2 (two) times daily as needed for muscle spasms.    . DULERA 200-5 MCG/ACT AERO TAKE 2 PUFFS BY MOUTH TWICE A DAY (Patient taking differently: Inhale 2 puffs into the lungs 2 (two) times daily.) 13 g 6  . DULoxetine (CYMBALTA) 60 MG capsule Take 1 capsule (60 mg total) by mouth daily. 60 capsule 6  . isosorbide mononitrate (IMDUR) 60 MG 24 hr tablet Take 1 tablet (60 mg total) by mouth daily. 30 tablet 5  . nitroGLYCERIN (NITROSTAT) 0.4 MG SL tablet Place 1 tablet (0.4 mg total) under the tongue every 5 (five) minutes x 3 doses as needed for chest pain. 30 tablet 3  . omeprazole (PRILOSEC) 40 MG capsule TAKE ONE CAPSULE BY  MOUTH ONCE DAILY. 30 capsule 0  . tamsulosin (FLOMAX) 0.4 MG CAPS capsule Take 0.4 mg by mouth.    . fluticasone (FLONASE) 50 MCG/ACT nasal spray Place 1 spray into both nostrils daily for 14 days. (Patient not taking: Reported on 07/21/2020) 16 g 0  . gabapentin (NEURONTIN) 300 MG capsule TAKE (1) CAPSULE BY MOUTH TWICE DAILY. (Patient not taking: Reported on 07/21/2020) 60 capsule 0  . hydrOXYzine (ATARAX/VISTARIL) 25 MG tablet TAKE 1 TABLET BY MOUTH 3 TIMES DAILY AS NEEDED. (Patient not taking: Reported on 07/21/2020) 90 tablet 0  . Multiple Vitamins-Minerals (CENTRUM MEN PO) Take 1 tablet by mouth daily.  (Patient not taking: Reported on 07/21/2020)    . Na Sulfate-K Sulfate-Mg Sulf 17.5-3.13-1.6 GM/177ML SOLN At 5 PM the day before procedure take 1 bottle and 5 hours before procedure take 1 bottle. (Patient not taking: Reported on 07/21/2020) 354 mL 0  . Cetirizine HCl (ZYRTEC ALLERGY) 10 MG CAPS Take 1 capsule (10 mg total) by mouth 1 day or 1 dose for 30 doses. (Patient not taking: Reported on 07/21/2020) 30 capsule 0   Facility-Administered Medications Prior to Visit  Medication Dose Route Frequency Provider Last Rate Last Admin  . sodium chloride flush (NS) 0.9 % injection 3 mL  3 mL Intravenous Q12H Caroleen Hamman, FNP      . sodium chloride flush (NS) 0.9 % injection 3 mL  3 mL Intravenous Q12H Branch, Dorothe Pea, MD         Objective:     BP 118/72 (BP Location: Left Arm, Cuff Size: Normal)   Pulse (!) 52   Temp (!) 97.3 F (36.3 C) (Other (Comment)) Comment (Src): wrist  Ht 6\' 3"  (1.905 m)   Wt 208 lb 12.8 oz (94.7 kg)   SpO2 97% Comment: Room air  BMI 26.10 kg/m   SpO2: 97 % (Room air)   amb wm nad   HEENT : pt wearing mask not removed for exam due to covid - 19 concerns.    NECK :  without JVD/Nodes/TM/ nl carotid upstrokes bilaterally   LUNGS: no acc muscle use,  Mild barrel  contour chest wall with bilateral  Distant bs s audible wheeze and  without cough  on insp or exp maneuvers  and mild  Hyperresonant  to  percussion bilaterally     CV:  RRR  no s3 or murmur or increase in P2, and no edema   ABD:  soft and nontender with pos end  insp Hoover's  in the supine position. No bruits or organomegaly appreciated, bowel sounds nl  MS:   Nl gait/  ext warm without deformities, calf tenderness, cyanosis or clubbing No obvious joint restrictions   SKIN: warm and dry without lesions  NEURO:  alert, approp, nl sensorium with  no motor or cerebellar deficits apparent.        I personally reviewed images and agree with radiology impression as follows:  CXR:   12/20/19  Chronic bronchitic changes without superimposed pneumonia or heart failure.  CXR PA and Lateral:   07/21/2020 :    I personally reviewed images and agree with radiology impression as follows:   Did not go for cxr as rec   Labs ordered 07/21/2020  :  allergy profile   alpha one AT phenotype      Assessment   COPD GOLD ? / still smoking  Active smoker - 07/21/2020  After extensive coaching inhaler device,  effectiveness =    90% so try breztri 2bid  - Labs ordered 07/21/2020  :  allergy profile   alpha one AT phenotype    Having mild aecopd now so  Group D in terms of symptom/risk and laba/lama/ICS  therefore appropriate rx at this point >>>  breztri 2bid and if can't afford on his insurance ok to do generic symbicort 160 2bid   Re saba: I spent extra time with pt today reviewing appropriate use of albuterol for prn use on exertion with the following points: 1) saba is for relief of sob that does not improve by walking a slower pace or resting but rather if the pt does not improve after trying this first. 2) If the pt is convinced, as many are, that saba helps recover from activity faster then it's easy to tell if this is the case by re-challenging : ie stop, take the inhaler, then p 5 minutes try the exact same activity (intensity of workload) that just caused the symptoms  and see if they are substantially diminished or not after saba 3) if there is an activity that reproducibly causes the symptoms, try the saba 15 min before the activity on alternate days   If in fact the saba really does help, then fine to continue to use it prn but advised may need to look closer at the maintenance regimen being used to achieve better control of airways disease with exertion.    Re Covid 19 Patient no longer fully vaccinated  and was informed of the seriousness of COVID 19 infection as a direct risk to lung health as well as safety to close contacts and should continue to wear a facemask in public and minimize exposure to public locations but especially avoid any area or activity where non-close contacts are not observing distancing or wearing an appropriate face mask.   >>> rec booster with whichever he received previously based on concerns with the omicron variant as it appears he's been > 6 months out from last vaccination       Cigarette smoker Counseled re importance of smoking cessation but did not meet time criteria for separate billing    Re Ca screen Low-dose CT lung cancer screening is recommended for patients who are 36-61 years of age with a 30+ pack-year history of smoking, and who are currently smoking or quit <=15 years ago.   He is eligible but I emphasized the number of lives saved by screening ct vs not was  very slim benefit  and lots of people screened still died of cancer in the "treated group" in the study so the best way to reduce risk of cancer is not smoke, and not just to rely on this test or any other.   Will start with repeat cxr  as having mild flare now and then consider enter the low dose screening program p 1st of year when   returns for f/u PFTs            Each maintenance medication was reviewed in detail including emphasizing most importantly the difference between maintenance and prns and under what circumstances the prns are to be  triggered using an action plan format where appropriate.  Total time for H and P, chart review, counseling, teaching device and generating customized AVS unique to this office visit / charting = 47 min           Christinia Gully, MD 07/21/2020

## 2020-07-21 NOTE — Assessment & Plan Note (Signed)
Active smoker - 07/21/2020  After extensive coaching inhaler device,  effectiveness =    90% so try breztri 2bid  - Labs ordered 07/21/2020  :  allergy profile   alpha one AT phenotype    Having mild aecopd now so  Group D in terms of symptom/risk and laba/lama/ICS  therefore appropriate rx at this point >>>  breztri 2bid and if can't afford on his insurance ok to do generic symbicort 160 2bid   Re saba: I spent extra time with pt today reviewing appropriate use of albuterol for prn use on exertion with the following points: 1) saba is for relief of sob that does not improve by walking a slower pace or resting but rather if the pt does not improve after trying this first. 2) If the pt is convinced, as many are, that saba helps recover from activity faster then it's easy to tell if this is the case by re-challenging : ie stop, take the inhaler, then p 5 minutes try the exact same activity (intensity of workload) that just caused the symptoms and see if they are substantially diminished or not after saba 3) if there is an activity that reproducibly causes the symptoms, try the saba 15 min before the activity on alternate days   If in fact the saba really does help, then fine to continue to use it prn but advised may need to look closer at the maintenance regimen being used to achieve better control of airways disease with exertion.    Re Covid 19 Patient no longer fully vaccinated  and was informed of the seriousness of COVID 19 infection as a direct risk to lung health as well as safety to close contacts and should continue to wear a facemask in public and minimize exposure to public locations but especially avoid any area or activity where non-close contacts are not observing distancing or wearing an appropriate face mask.   >>> rec booster with whichever he received previously based on concerns with the omicron variant as it appears he's been > 6 months out from last vaccination

## 2020-07-23 ENCOUNTER — Ambulatory Visit (HOSPITAL_COMMUNITY)
Admission: RE | Admit: 2020-07-23 | Discharge: 2020-07-23 | Disposition: A | Discharge status: Home / Self Care | Payer: Medicaid Other | Source: Ambulatory Visit | Attending: Internal Medicine | Admitting: Internal Medicine

## 2020-07-23 ENCOUNTER — Other Ambulatory Visit (HOSPITAL_COMMUNITY)
Admission: RE | Admit: 2020-07-23 | Discharge: 2020-07-23 | Disposition: A | Discharge status: Home / Self Care | Payer: Medicaid Other | Source: Ambulatory Visit | Attending: Internal Medicine | Admitting: Internal Medicine

## 2020-07-23 ENCOUNTER — Other Ambulatory Visit: Payer: Self-pay

## 2020-07-23 DIAGNOSIS — J449 Chronic obstructive pulmonary disease, unspecified: Secondary | ICD-10-CM | POA: Diagnosis present

## 2020-07-23 LAB — CBC WITH DIFFERENTIAL/PLATELET
Abs Immature Granulocytes: 0.02 10*3/uL (ref 0.00–0.07)
Basophils Absolute: 0 10*3/uL (ref 0.0–0.1)
Basophils Relative: 1 %
Eosinophils Absolute: 0.2 10*3/uL (ref 0.0–0.5)
Eosinophils Relative: 3 %
HCT: 46 % (ref 39.0–52.0)
Hemoglobin: 15.4 g/dL (ref 13.0–17.0)
Immature Granulocytes: 0 %
Lymphocytes Relative: 36 %
Lymphs Abs: 3 10*3/uL (ref 0.7–4.0)
MCH: 32.1 pg (ref 26.0–34.0)
MCHC: 33.5 g/dL (ref 30.0–36.0)
MCV: 95.8 fL (ref 80.0–100.0)
Monocytes Absolute: 0.6 10*3/uL (ref 0.1–1.0)
Monocytes Relative: 7 %
Neutro Abs: 4.6 10*3/uL (ref 1.7–7.7)
Neutrophils Relative %: 53 %
Platelets: 223 10*3/uL (ref 150–400)
RBC: 4.8 MIL/uL (ref 4.22–5.81)
RDW: 12.8 % (ref 11.5–15.5)
WBC: 8.4 10*3/uL (ref 4.0–10.5)
nRBC: 0 % (ref 0.0–0.2)

## 2020-07-27 ENCOUNTER — Encounter: Payer: Self-pay | Admitting: *Deleted

## 2020-07-27 LAB — ALPHA-1 ANTITRYPSIN PHENOTYPE: A-1 Antitrypsin, Ser: 151 mg/dL (ref 101–187)

## 2020-07-27 LAB — IGE: IgE (Immunoglobulin E), Serum: <2 IU/mL — ABNORMAL LOW (ref 6–495)

## 2020-07-27 NOTE — Progress Notes (Signed)
lmtcb

## 2020-07-28 NOTE — Progress Notes (Signed)
Tried calling the pt and line only rings once and then stops- unable to leave a msg. Will try another time 07/29/20 and then mail letter if still unsuccessful.

## 2020-07-29 ENCOUNTER — Telehealth: Payer: Self-pay | Admitting: Internal Medicine

## 2020-07-29 NOTE — Telephone Encounter (Signed)
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  Tried to call pt at number we have listed in demographics section but got the same recording when called that number and still unable to leave a VM. Will try to call back later.

## 2020-07-30 NOTE — Telephone Encounter (Signed)
lmtcb for pt.  

## 2020-08-03 NOTE — Telephone Encounter (Signed)
ATC patient unable to reach LM to call back office (x1)  

## 2020-08-04 NOTE — Telephone Encounter (Signed)
I spoke with the pt  I gave him the labs and cxr results  He understands these  He wanted to let Dr Melvyn Novas know that he is not feeling well  He states he finished zpack and pred given to him at [facility] 07/21/20 This helped for a few days but now he is c/o weakness, SOB, cough w/ yellow sputu, night sweats, chills, aches  He has no thermometer so unable to check his temp  He states that he has had all of these symptoms for 2-3 wks  He is taking his breztri as directed and using his albuterol inhaler about once per day on average  He had had covid vax x 2- last one 02-11-21 Please advise, thanks  Last ov avs: Plan A = Automatic = Always=    Breztri Take 2 puffs first thing in am and then another 2 puffs about 12 hours later.    Work on inhaler technique:  relax and gently blow all the way out then take a nice smooth deep breath back in, triggering the inhaler at same time you start breathing in.  Hold for up to 5 seconds if you can. Blow out thru nose. Rinse and gargle with water when done     Plan B = Backup (to supplement plan A, not to replace it) Only use your albuterol inhaler as a rescue medication to be used if you can't catch your breath by resting or doing a relaxed purse lip breathing pattern.  - The less you use it, the better it will work when you need it. - Ok to use the inhaler up to 2 puffs  every 4 hours if you must but call for appointment if use goes up over your usual need - Don't leave home without it !!  (think of it like the spare tire for your car)    Zpak   Prednisone 10 mg take  4 each am x 2 days,   2 each am x 2 days,  1 each am x 2 days and stop   Please remember to go to the lab and x-ray department at [facility]   for your tests - we will call you with the results when they are available.          Please schedule a follow up office visit in 6 weeks, call sooner if needed PFTs on return

## 2020-08-04 NOTE — Telephone Encounter (Signed)
Patient is returning phone call. Patient phone number is [phone].

## 2020-08-04 NOTE — Telephone Encounter (Signed)
Ok to add on as televisit this week any day but Friday 1/14 but let him know he'll be a work in and I'll get to him by end of day at the latest

## 2020-08-04 NOTE — Telephone Encounter (Signed)
08/04/2020  Called and spoke with patient.  He is agreeable to a televisit on 08/06/2020.  He is aware that he is being worked in.  Patient preferred to be called at: [phone]  Will route to [place] to schedule.  Will route to Dr. Shyrl Numbers as Juluis Rainier  Nothing further needed  Wyn Quaker, FNP

## 2020-08-04 NOTE — Telephone Encounter (Signed)
Televisit has been scheduled at 11:30 08/06/20.

## 2020-08-06 ENCOUNTER — Encounter: Payer: Self-pay | Admitting: Internal Medicine

## 2020-08-06 ENCOUNTER — Ambulatory Visit (INDEPENDENT_AMBULATORY_CARE_PROVIDER_SITE_OTHER): Payer: Medicaid Other | Admitting: Internal Medicine

## 2020-08-06 ENCOUNTER — Other Ambulatory Visit: Payer: Self-pay

## 2020-08-06 DIAGNOSIS — J449 Chronic obstructive pulmonary disease, unspecified: Secondary | ICD-10-CM | POA: Diagnosis not present

## 2020-08-06 DIAGNOSIS — F17210 Nicotine dependence, cigarettes, uncomplicated: Secondary | ICD-10-CM

## 2020-08-06 DIAGNOSIS — J431 Panlobular emphysema: Secondary | ICD-10-CM

## 2020-08-06 MED ORDER — BREZTRI AEROSPHERE 160-9-4.8 MCG/ACT IN AERO: 2.0000 | INHALATION_SPRAY | Freq: Two times a day (BID) | RESPIRATORY_TRACT | 11 refills | Status: DC

## 2020-08-06 MED ORDER — BREZTRI AEROSPHERE 160-9-4.8 MCG/ACT IN AERO: 2.0000 | INHALATION_SPRAY | Freq: Two times a day (BID) | RESPIRATORY_TRACT | 0 refills | Status: DC

## 2020-08-06 MED ORDER — PREDNISONE 10 MG PO TABS: ORAL_TABLET | ORAL | 0 refills | Status: DC

## 2020-08-06 NOTE — Progress Notes (Signed)
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 Consult    Productive cough with yellow/brown phlegm since July 2021, shortness of breath at rest and activity  Dyspnea:  [employer] slow pace ok on dulera 8 am and w/in a few hours used saba  Cough: cough x July 21 as above, worse in am's Sleep: flat bed 2 pillows / inhaler once a week SABA use: too much p activity, rarely ever at rest 02 none rec Plan A = Automatic = Always=    Breztri Take 2 puffs first thing in am and then another 2 puffs about 12 hours later.  Work on inhaler technique:    Plan B = Backup (to supplement plan A, not to replace it) Only use your albuterol inhaler as a rescue medication   Zpak Prednisone 10 mg take  4 each am x 2 days,   2 each am x 2 days,  1 each am x 2 days and stop Please schedule a follow up office visit in 6 weeks, call sooner if needed PFTs on return  Phone call 07/29/20 He wanted to let Keith Gibbs know that he is not feeling well  He states he finished zpack and pred given to him at [facility] 07/21/20 This helped for a few days but now he is c/o weakness, SOB, cough w/ yellow sputu, night sweats, chills, aches  He has no thermometer so unable to check his temp  He states that he has had all of these symptoms for 2-3 wks  He is taking his breztri as directed and using his albuterol inhaler about once per day on average  He had had covid vax x 2- last one 02-12-20       Virtual Visit via Telephone Note 08/06/2020   I connected with Keith Gibbs on 08/06/20 at 12 pm  EST by telephone and  verified that I am speaking with the correct person using two identifiers. Pt is at home and this call made from my office with no other participants    I discussed the limitations, risks, security and privacy concerns of performing an evaluation and management service by telephone and the availability of in person appointments. I also discussed with the patient that there may be a patient responsible charge related to this service. The patient expressed understanding and agreed to proceed.   History of Present Illness:  Still smoking, not taking breztri Dyspnea:  [employer] walking ok Cough: mucus yellow  Sleeping:flat bed/ 2 pillows wakes up with sweats / shakes x months  SABA use: only using once a day  02: none   No obvious day to day or daytime variability or assoc excess/ purulent sputum or mucus plugs or hemoptysis or cp or chest tightness, subjective wheeze or overt sinus or hb symptoms.    Also denies any obvious fluctuation of symptoms with weather or environmental changes or other aggravating or alleviating factors except as outlined above.   Meds reviewed/ med reconciliation completed     No outpatient medications have  been marked as taking for the 08/06/20 encounter (Appointment) with Keith Rockers, MD.   Current Facility-Administered Medications for the 08/06/20 encounter (Appointment) with Keith Rockers, MD  Medication  . sodium chloride flush (NS) 0.9 % injection 3 mL         Observations/Objective: slt gruff voice, no conversational sob or spont breathing    I personally reviewed images and agree with radiology impression as follows:  CXR:   06/3020 1. No acute cardiopulmonary disease. 2. Stable mildly distorted central interstitial markings, nonspecific. If there is clinical concern for interstitial lung disease, high-resolution chest CT could be obtained for further evaluation.  Assessment and Plan: See problem list for active a/p's   Follow Up  Instructions: See avs for instructions unique to this ov which includes revised/ updated med list     I discussed the assessment and treatment plan with the patient. The patient was provided an opportunity to ask questions and all were answered. The patient agreed with the plan and demonstrated an understanding of the instructions.   The patient was advised to call back or seek an in-person evaluation if the symptoms worsen or if the condition fails to improve as anticipated.  I provided 30 minutes of non-face-to-face time during this encounter.   Keith Gully, MD            Past Medical History:  Diagnosis Date  . Anxiety   . Arthritis   . Bursitis   . Chronic pain   . COPD (chronic obstructive pulmonary disease) ([place])   . Depression   . GERD (gastroesophageal reflux disease)   . Hepatitis C 2012   No longer has Hep C  . Hypertension   . Lung disease   . MI (myocardial infarction) ([place])   . Stroke ([place])   . Substance abuse ([place])

## 2020-08-06 NOTE — Addendum Note (Signed)
Addended by: Lia Foyer R on: 08/06/2020 12:54 PM   Modules accepted: Orders

## 2020-08-06 NOTE — Assessment & Plan Note (Signed)
4-5 min discussion re active cigarette smoking in addition to office E&M  Ask about tobacco use:  Says quitting Advise quitting   I emphasized that although we never turn away smokers from the pulmonary clinic, we do ask that they understand that the recommendations that we make  won't work nearly as well in the presence of continued cigarette exposure. In fact, we may very well  reach a point where we can't promise to help the patient if he/she can't quit smoking. (We can and will promise to try to help, we just can't promise what we recommend will really work)  Assess willingness:  Not committed at this point Assist in quit attempt:  Per PCP when ready Arrange follow up:   Follow up per Primary Care planned

## 2020-08-06 NOTE — Assessment & Plan Note (Signed)
Active smoke/ MM/ very limited funds - 07/21/2020  After extensive coaching inhaler device,  effectiveness =    90% so try breztri 2bid  - Labs ordered 07/21/2020  :  allergy profile EOS  0.2 / IgE <2    alpha one AT phenotype  MM   Group D in terms of symptom/risk and laba/lama/ICS  therefore appropriate rx at this point >>>  Continue breztri plus pred x 6 days plus approp saba  I spent extra time with pt today reviewing appropriate use of albuterol for prn use on exertion with the following points: 1) saba is for relief of sob that does not improve by walking a slower pace or resting but rather if the pt does not improve after trying this first. 2) If the pt is convinced, as many are, that saba helps recover from activity faster then it's easy to tell if this is the case by re-challenging : ie stop, take the inhaler, then p 5 minutes try the exact same activity (intensity of workload) that just caused the symptoms and see if they are substantially diminished or not after saba 3) if there is an activity that reproducibly causes the symptoms, try the saba 15 min before the activity on alternate days   If in fact the saba really does help, then fine to continue to use it prn but advised may need to look closer at the maintenance regimen being used to achieve better control of airways disease with exertion.   F/u in 2 weeks with all meds in hand using a trust but verify approach to confirm accurate Medication  Reconciliation The principal here is that until we are certain that the  patients are doing what we've asked, it makes no sense to ask them to do more.   Each maintenance medication was reviewed in detail including most importantly the difference between maintenance and as needed and under what circumstances the prns are to be used.  Please see AVS for specific  Instructions which are unique to this visit and I personally typed out  which were reviewed in detail over the phone with the patient and  a copy provided via my chart

## 2020-08-06 NOTE — Patient Instructions (Signed)
Plan A = Automatic = Always=    Breztri Take 2 puffs first thing in am and then another 2 puffs about 12 hours later.   Prednisone 10 mg take  4 each am x 2 days,   2 each am x 2 days,  1 each am x 2 days and stop    Plan B = Backup (to supplement plan A, not to replace it) Only use your albuterol inhaler as a rescue medication to be used if you can't catch your breath by resting or doing a relaxed purse lip breathing pattern.  - The less you use it, the better it will work when you need it. - Ok to use the inhaler up to 2 puffs  every 4 hours if you must but call for appointment if use goes up over your usual need - Don't leave home without it !!  (think of it like the spare tire for your car)   The key is to stop smoking completely before smoking completely stops you!   Ok to stop by for 2 weeks of breztri samples but see if you can fill the prescription before your visit and if not on the 3 dollar plan we'll need to get you pre-approved for it.    Please schedule a follow up office visit in 2 weeks, sooner if needed  with all medications /inhalers/ solutions in hand so we can verify exactly what you are taking. This includes all medications from all doctors and over the counters

## 2020-08-19 ENCOUNTER — Other Ambulatory Visit: Payer: Self-pay | Admitting: Family Medicine

## 2020-08-19 DIAGNOSIS — M5136 Other intervertebral disc degeneration, lumbar region: Secondary | ICD-10-CM

## 2020-08-19 DIAGNOSIS — F32A Depression, unspecified: Secondary | ICD-10-CM

## 2020-08-19 NOTE — Telephone Encounter (Signed)
Requested medication (s) are due for refill today: Yes  Requested medication (s) are on the active medication list: Yes  Last refill:  05/28/19, 04/24/20  Future visit scheduled: Yes  Notes to clinic:  Unable to refill per protocol, expired Rx, last refilled by historical provider     Requested Prescriptions  Pending Prescriptions Disp Refills   cyclobenzaprine (FLEXERIL) 10 MG tablet [Pharmacy Med Name: CYCLOBENZAPRINE 10 MG TABLET] 60 tablet 0    Sig: TAKE (1) TABLET BY MOUTH TWICE DAILY AS NEEDED FOR SPASMS.      Not Delegated - Analgesics:  Muscle Relaxants Failed - 08/19/2020  9:58 AM      Failed - This refill cannot be delegated      Failed - Valid encounter within last 6 months    Recent Outpatient Visits           8 months ago Chronic hepatitis C without hepatic coma ([place])   [facility] Flinchum, Kelby Aline, FNP   9 months ago Encounter for routine adult health examination without abnormal findings   [facility], Kelby Aline, FNP   1 year ago [place], Charlane Ferretti, MD   1 year ago DDD (degenerative disc disease), lumbar   [place], MD   2 years ago Chronic hepatitis C without hepatic coma [facility])   [facility], Dionne Bucy, MD       Future Appointments             In 2 days Melvyn Novas, Christena Deem, MD [facility]   In 1 week Flinchum, Kelby Aline, [place], [facility]               DULoxetine (CYMBALTA) 60 MG capsule [Pharmacy Med Name: DULOXETINE HCL DR 60 MG CAP] 30 capsule 0    Sig: TAKE ONE CAPSULE BY MOUTH ONCE DAILY.      Psychiatry: Antidepressants - SNRI Failed - 08/19/2020  9:58 AM      Failed - Valid encounter within last 6 months    Recent Outpatient Visits           8 months ago Chronic hepatitis C without hepatic coma ([place])   [place] Flinchum,  Kelby Aline, FNP   9 months ago Encounter for routine adult health examination without abnormal findings   [facility], Kelby Aline, FNP   1 year ago [place], [place], MD   1 year ago DDD (degenerative disc disease), lumbar   [place] [place], Charlane Ferretti, MD   2 years ago Chronic hepatitis C without hepatic coma [facility])   [facility], Dionne Bucy, MD       Future Appointments             In 2 days Tanda Rockers, MD [facility]   In 1 week Flinchum, Kelby Aline, [place], [place] - Completed PHQ-2 or PHQ-9 in the last 360 days      Passed - Last BP in normal range    BP Readings from Last 1 Encounters:  07/21/20 118/72

## 2020-08-19 NOTE — Telephone Encounter (Signed)
Patient called and advised of the need for an appointment. Appointment scheduled for Monday, 08/31/20 at 1040 with [name], FNP. Advised to keep appointment in order to continue to receive medication refills.

## 2020-08-21 ENCOUNTER — Other Ambulatory Visit: Payer: Self-pay

## 2020-08-21 ENCOUNTER — Ambulatory Visit (INDEPENDENT_AMBULATORY_CARE_PROVIDER_SITE_OTHER): Payer: Medicaid Other | Admitting: Internal Medicine

## 2020-08-21 ENCOUNTER — Encounter: Payer: Self-pay | Admitting: Internal Medicine

## 2020-08-21 DIAGNOSIS — J449 Chronic obstructive pulmonary disease, unspecified: Secondary | ICD-10-CM | POA: Diagnosis not present

## 2020-08-21 MED ORDER — BREZTRI AEROSPHERE 160-9-4.8 MCG/ACT IN AERO: 2.0000 | INHALATION_SPRAY | Freq: Two times a day (BID) | RESPIRATORY_TRACT | 0 refills | Status: DC

## 2020-08-21 MED ORDER — PREDNISONE 10 MG PO TABS: ORAL_TABLET | ORAL | 2 refills | Status: DC

## 2020-08-21 NOTE — Assessment & Plan Note (Addendum)
MM/quit smoking 08/10/20 - 07/21/2020  After extensive coaching inhaler device,  effectiveness =    90% so try breztri 2bid  - Labs ordered 07/21/2020  :  allergy profile EOS  0.2 / IgE <2    alpha one AT phenotype  MM   cliinically  Group D in terms of symptom/risk and laba/lama/ICS  therefore appropriate rx at this point >>>  Continue breztri if covered  Advised:  formulary restrictions will be an ongoing challenge for the forseable future and I would be happy to pick an alternative if the pt will first  provide me a list of them -  pt  will need to return here for training for any new device that is required eg dpi vs hfa vs respimat.    In the meantime we can always provide samples so that the patient never runs out of any needed respiratory medications.    congrats on not smoking   >>> f/u with pfts then ov in 3 m - call sooner if needed          Each maintenance medication was reviewed in detail including emphasizing most importantly the difference between maintenance and prns and under what circumstances the prns are to be triggered using an action plan format where appropriate.  Total time for H and P, chart review, counseling, reviewing hfa device(s) and generating customized AVS unique to this office visit / same day charting = 25 min

## 2020-08-21 NOTE — Progress Notes (Signed)
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 Consult    Productive cough with yellow/brown phlegm since July 2021, shortness of breath at rest and activity  Dyspnea:  [employer] slow pace ok on dulera 8 am and w/in a few hours used saba  Cough: cough x July 21 as above, wore in am's Sleep: flat bed 2 pillows / inhaler once a week SABA use: too much p activity, rarely ever at rest 02 none rec Plan A = Automatic = Always=    Breztri Take 2 puffs first thing in am and then another 2 puffs about 12 hours later.  Work on inhaler technique: Plan B = Backup (to supplement plan A, not to replace it) Only use your albuterol inhaler as a rescue medication Zpak Prednisone 10 mg take  4 each am x 2 days,   2 each am x 2 days,  1 each am x 2 days and stop   08/06/20 PC  pred x 6 days for flare    08/21/2020  f/u ov/[place] office/Shelly Spenser re:  Chief Complaint  Patient presents with  . Follow-up    Shortness of breath with activity     Dyspnea:  Improved over baseline and always better off pred Cough: min rattling in am / mucoid and better off cigs Sleeping: ok  SABA use: once a day at more  02: none    No obvious day to day or daytime variability or assoc excess/ purulent sputum or mucus plugs or hemoptysis or cp or chest tightness, subjective wheeze or overt sinus or hb symptoms.   Sleeping  without nocturnal  or early am exacerbation  of respiratory  c/o's or need for noct saba. Also denies any obvious fluctuation of symptoms with weather or environmental  changes or other aggravating or alleviating factors except as outlined above   No unusual exposure hx or h/o childhood pna/ asthma or knowledge of premature birth.  Current Allergies, Complete Past Medical History, Past Surgical History, Family History, and Social History were reviewed in [employer] record.  ROS  The following are not active complaints unless bolded Hoarseness, sore throat, dysphagia, dental problems, itching, sneezing,  nasal congestion or discharge of excess mucus or purulent secretions, ear ache,   fever, chills, sweats, unintended wt loss or wt gain, classically pleuritic or exertional cp,  orthopnea pnd or arm/hand swelling  or leg swelling, presyncope, palpitations, abdominal pain, anorexia, nausea, vomiting, diarrhea  or change in bowel habits or change in bladder habits, change in stools or change in urine, dysuria, hematuria,  rash, arthralgias, visual complaints, headache, numbness, weakness or ataxia or problems with walking or coordination,  change in mood or  memory.        Current Meds  Medication Sig  . albuterol (VENTOLIN HFA) 108 (90 Base) MCG/ACT inhaler Inhale 2 puffs into the lungs every 6 (six) hours as needed for wheezing or shortness of breath.  Marland Kitchen aspirin EC 81 MG tablet Take 81 mg by mouth  daily.  . atorvastatin (LIPITOR) 40 MG tablet Take 1 tablet (40 mg total) by mouth daily at 6 PM. (Patient taking differently: Take 40 mg by mouth at bedtime.)  . Budeson-Glycopyrrol-Formoterol (BREZTRI AEROSPHERE) 160-9-4.8 MCG/ACT AERO Inhale 2 puffs into the lungs 2 (two) times daily.  . cetirizine (ZYRTEC) 10 MG tablet TAKE 1 TABLETS DAILY.  Marland Kitchen clopidogrel (PLAVIX) 75 MG tablet Take 75 mg by mouth daily.  . cyclobenzaprine (FLEXERIL) 10 MG tablet TAKE (1) TABLET BY MOUTH TWICE DAILY AS NEEDED FOR SPASMS.  . DULoxetine (CYMBALTA) 60 MG capsule TAKE ONE CAPSULE BY MOUTH ONCE DAILY.  . isosorbide mononitrate (IMDUR) 60 MG 24 hr tablet Take 1  tablet (60 mg total) by mouth daily.  . nitroGLYCERIN (NITROSTAT) 0.4 MG SL tablet Place 1 tablet (0.4 mg total) under the tongue every 5 (five) minutes x 3 doses as needed for chest pain.  Marland Kitchen omeprazole (PRILOSEC) 40 MG capsule TAKE ONE CAPSULE BY MOUTH ONCE DAILY.  . tamsulosin (FLOMAX) 0.4 MG CAPS capsule Take 0.4 mg by mouth.  . [DISCONTINUED] Budeson-Glycopyrrol-Formoterol (BREZTRI AEROSPHERE) 160-9-4.8 MCG/ACT AERO Inhale 2 puffs into the lungs 2 (two) times daily.      Past Medical History:  Diagnosis Date  . Anxiety   . Arthritis   . Bursitis   . Chronic pain   . COPD (chronic obstructive pulmonary disease) ([place])   . Depression   . GERD (gastroesophageal reflux disease)   . Hepatitis C 2012   No longer has Hep C  . Hypertension   . Lung disease   . MI (myocardial infarction) ([place])   . Stroke ([place])   . Substance abuse ([place])               Objective:     Wt Readings from Last 3 Encounters:  08/21/20 219 lb (99.3 kg)  07/21/20 208 lb 12.8 oz (94.7 kg)  02/13/20 220 lb 7.4 oz (100 kg)      Vital signs reviewed  08/21/2020  - Note at rest 02 sats  98% on RA   General appearance:    Thin amb wm nad   HEENT : pt wearing mask not removed for exam due to covid -19 concerns.    NECK :  without JVD/Nodes/TM/ nl carotid upstrokes bilaterally   LUNGS: no acc muscle use,  Mod barrel  contour chest wall with bilateral  Distant bs s audible wheeze and  without cough on insp or exp maneuvers and mod  Hyperresonant  to  percussion bilaterally     CV:  RRR  no s3 or murmur or increase in P2, and no edema   ABD:  soft and nontender with pos mid insp Hoover's  in the supine position. No bruits or organomegaly appreciated, bowel sounds nl  MS:     ext warm without deformities, calf tenderness, cyanosis or clubbing No obvious joint restrictions   SKIN: warm and dry without lesions    NEURO:  alert, approp, nl sensorium with  no motor or cerebellar deficits apparent.                I personally reviewed images and agree with radiology impression as follows:  CXR:   07/23/20 1. No acute cardiopulmonary disease. 2. Stable mildly distorted central interstitial markings, nonspecific.    Assessment

## 2020-08-21 NOTE — Patient Instructions (Signed)
Plan A = Automatic = Always=    breztri Take 2 puffs first thing in am and then another 2 puffs about 12 hours later.      Plan B = Backup (to supplement plan A, not to replace it) Only use your albuterol inhaler as a rescue medication to be used if you can't catch your breath by resting or doing a relaxed purse lip breathing pattern.  - The less you use it, the better it will work when you need it. - Ok to use the inhaler up to 2 puffs  every 4 hours if you must but call for appointment if use goes up over your usual need - Don't leave home without it !!  (think of it like the spare tire for your car)   Plan C = Crisis - breathing getting worse despite A and B > Prednisone 10 mg take  4 each am x 2 days,   2 each am x 2 days,  1 each am x 2 days and stop    Please schedule a follow up visit in 3 months but call sooner if needed

## 2020-08-24 ENCOUNTER — Ambulatory Visit (HOSPITAL_COMMUNITY)
Admission: RE | Admit: 2020-08-24 | Discharge: 2020-08-24 | Disposition: A | Discharge status: Home / Self Care | Payer: Medicaid Other | Source: Ambulatory Visit | Attending: Nurse Practitioner | Admitting: Nurse Practitioner

## 2020-08-24 ENCOUNTER — Other Ambulatory Visit: Payer: Self-pay

## 2020-08-24 DIAGNOSIS — M5441 Lumbago with sciatica, right side: Secondary | ICD-10-CM | POA: Diagnosis present

## 2020-08-24 DIAGNOSIS — M5442 Lumbago with sciatica, left side: Secondary | ICD-10-CM | POA: Diagnosis present

## 2020-08-24 DIAGNOSIS — G8929 Other chronic pain: Secondary | ICD-10-CM | POA: Diagnosis present

## 2020-08-25 ENCOUNTER — Other Ambulatory Visit (HOSPITAL_COMMUNITY)
Admission: RE | Admit: 2020-08-25 | Discharge: 2020-08-25 | Disposition: A | Discharge status: Home / Self Care | Payer: Medicaid Other | Source: Ambulatory Visit | Attending: Internal Medicine | Admitting: Internal Medicine

## 2020-08-25 NOTE — Progress Notes (Signed)
Someone called and canceled patient's appointment for his covid test today. I tried to reach him and couldn't, I'll keep trying tomorrow. Pt. Has a PFT on Thursday. Spoke with Arbie Cookey she said her and Judeen Hammans would keep trying to get in touch him also.  I'll leave encounter open, in case one of [place] gets in touch with him. Spoke with patient he has a sore throat and is coughing a lot. Patient is going to call his Dr. And reschedule his procedure. I call Arbie Cookey and Judeen Hammans to let them know. Spoke with Arbie Cookey she is going to make [place] aware.

## 2020-08-26 ENCOUNTER — Telehealth: Payer: Self-pay | Admitting: Internal Medicine

## 2020-08-27 ENCOUNTER — Ambulatory Visit (HOSPITAL_COMMUNITY): Admission: RE | Admit: 2020-08-27 | Payer: Medicaid Other | Source: Ambulatory Visit

## 2020-08-31 ENCOUNTER — Ambulatory Visit: Payer: Self-pay | Admitting: Adult Health

## 2020-09-01 NOTE — Telephone Encounter (Addendum)
Called pt & left vm making him aware [facility] schedules the pft's at [facility].  I told him I would call them & let them know he needs to reschedule and I gave him their phone # & told him if he hasn't heard from them in a couple of days for him to call them.  I called [facility] & had to leave them a vm letting them know pt needs to reschedule pft.  Nothing further needed.

## 2020-09-04 ENCOUNTER — Ambulatory Visit: Payer: Self-pay | Admitting: Adult Health

## 2020-09-08 ENCOUNTER — Other Ambulatory Visit: Payer: Self-pay

## 2020-09-08 ENCOUNTER — Encounter: Payer: Self-pay | Admitting: Adult Health

## 2020-09-08 ENCOUNTER — Ambulatory Visit (INDEPENDENT_AMBULATORY_CARE_PROVIDER_SITE_OTHER): Payer: Medicaid Other | Admitting: Adult Health

## 2020-09-08 VITALS — BP 136/80 | HR 62 | Temp 97.3°F | Resp 16 | Wt 235.6 lb

## 2020-09-08 DIAGNOSIS — R251 Tremor, unspecified: Secondary | ICD-10-CM | POA: Diagnosis not present

## 2020-09-08 DIAGNOSIS — F1210 Cannabis abuse, uncomplicated: Secondary | ICD-10-CM

## 2020-09-08 DIAGNOSIS — F1410 Cocaine abuse, uncomplicated: Secondary | ICD-10-CM | POA: Diagnosis not present

## 2020-09-08 DIAGNOSIS — Z23 Encounter for immunization: Secondary | ICD-10-CM

## 2020-09-08 NOTE — Patient Instructions (Signed)
[email or web address]">  Managing Chronic Back Pain Chronic back pain is back pain that lasts for 12 weeks or longer. It often affects the lower back. Back pain may feel like a muscle ache or a sharp, stabbing pain. It can be mild, moderate, or severe. If you have been diagnosed with chronic back pain, there are things you can do to manage your symptoms. You may have to try different things to see what works best for you. Your health care provider may also give you specific instructions. How to manage lifestyle changes Treating chronic back pain often starts with rest and pain relief, followed by exercises to restore movement and strength to your back (physical therapy). You may need surgery if other treatments do not help, or if your pain is caused by a condition or an injury. Follow your treatment plan as told by your health care provider. This may include:  Relaxation techniques.  Talk therapy or counseling with a mental health specialist. A form of talk therapy called cognitive behavioral therapy (CBT) can be especially helpful. This therapy helps you set goals and follow up on the changes that you make.  Acupuncture or massage therapy.  Local electrical stimulation.  Injections. These deliver numbing or pain-relieving medicines into your spine or the area of pain. How to recognize changes in your chronic back pain Your condition may improve with treatment. However, back pain may not go away or may get worse over time. Watch your symptoms carefully and let your health care provider know if your symptoms get worse or do not improve. Your back pain may be getting worse if you have:  Pain that begins to cause problems with posture.  Pain that gets worse when you are sitting, standing, walking, bending, or lifting.  Pain that affects you while you are active, or at rest, or both.  Pain that eventually makes it hard to move around (limits mobility).  Pain that occurs with  fever, weight loss, or difficulty urinating.  Pain that causes numbness and tingling. How to use body mechanics and posture to help with pain Healthy body mechanics and good posture can help to relieve stress on your back. Body mechanics refers to the movements and positions of your body during your daily activities. Posture is part of body mechanics. Good posture means:  Your spine is in its natural S-curve, or neutral, position.  Your shoulders are pulled back slightly.  Your head is not tipped forward. Follow these guidelines to improve your posture and body mechanics in your everyday activities. Standing  When standing, keep your spine neutral and your feet about hip-width apart. Keep your knees slightly bent. Your ears, shoulders, and hips should line up.  When you do a task in which you stand in one place for a long time, place one foot on a stable object that is 2-4 inches (5-10 cm) high, such as a footstool. This helps keep your spine neutral.   Sitting  When sitting, keep your spine neutral and your feet flat on the floor. Use a footrest, if necessary, and keep your thighs parallel to the floor. Avoid rounding your shoulders, and avoid tilting your head forward.  When working at a desk or a computer, keep your desk at a height where your hands are slightly lower than your elbows. Slide your chair under your desk so you are close enough to maintain good posture.  When working at a computer, place your monitor at a height where you are looking straight ahead and  you do not have to tilt your head forward or downward to view the screen.   Lifting  Keep your feet at least shoulder-width apart and tighten the muscles of your abdomen.  Bend your knees and hips and keep your spine neutral. Be sure to lift using the strength of your legs, not your back. Do not lock your knees straight out.  Always ask for help to lift heavy or awkward objects.   Resting  When lying down and resting,  avoid positions that are most painful.  If you have pain with activities such as sitting, bending, stooping, or squatting, lie in a position in which your body does not bend very much. For example, avoid curling up on your side with your arms and knees near your chest (fetal position).  If you have pain with activities such as standing for a long time or reaching with your arms, lie with your spine in a neutral position and bend your knees slightly. Try: ? Lying on your side with a pillow between your knees. ? Lying on your back with a pillow under your knees.   Follow these instructions at home: Medicines  Treatment may include over-the-counter or prescription medicines for pain and inflammation that are taken by mouth or applied to the skin. Another treatment may include muscle relaxants. Take over-the-counter and prescription medicines only as told by your health care provider.  Ask your health care provider if the medicine prescribed to you: ? Requires you to avoid driving or using machinery. ? Can cause constipation. You may need to take these actions to prevent or treat constipation:  Drink enough fluid to keep your urine pale yellow.  Take over-the-counter or prescription medicines.  Eat foods that are high in fiber, such as beans, whole grains, and fresh fruits and vegetables.  Limit foods that are high in fat and processed sugars, such as fried or sweet foods. Lifestyle  Do not use any products that contain nicotine or tobacco, such as cigarettes, e-cigarettes, and chewing tobacco. If you need help quitting, ask your health care provider.  Eat a healthy diet that includes foods such as vegetables, fruits, fish, and lean meats.  Work with your health care provider to achieve or maintain a healthy weight. General instructions  Get regular exercise as told. Exercise improves flexibility and strength.  If physical therapy was prescribed, do exercises as told by your health care  provider.  Use ice or heat therapy as told by your health care provider.  Keep all follow-up visits as told by your health care provider. This is important. Where can I get support? Consider joining a support group for people managing chronic back pain. Ask your health care provider about support groups in your area. You can also find online and in-person support groups through:  The American Chronic Pain Association: theacpa.org  Pain Connection Program: painconnection.org Contact a health care provider if:  You have pain that is not relieved with rest or medicine.  Your pain gets worse, or you have new pain.  You have a fever.  You have rapid weight loss.  You have trouble doing your normal activities. Get help right away if:  You have weakness or numbness in one or both of your legs or feet.  You have trouble controlling your bladder or your bowels.  You have severe back pain and have any of the following: ? Nausea or vomiting. ? Abdominal pain. ? Shortness of breath or you faint. Summary  Chronic back  pain is often treated with rest, pain relief, and physical therapy.  Talk therapy, acupuncture, massage, and local electrical stimulation may help.  Follow your treatment plan as told by your health care provider.  Joining a support group may help you manage chronic back pain. This information is not intended to replace advice given to you by your health care provider. Make sure you discuss any questions you have with your health care provider. Document Revised: 08/22/2019 Document Reviewed: 04/30/2019 Elsevier Patient Education  2021 [employer]. Tremor A tremor is trembling or shaking that you cannot control. Most tremors affect the hands or arms. Tremors can also affect the head, vocal cords, face, and other parts of the body. There are many types of tremors. Common types include:  Essential tremor. These usually occur in people older than 40. It may run in  families and can happen in otherwise healthy people.  Resting tremor. These occur when the muscles are at rest, such as when your hands are resting in your lap. People with Parkinson's disease often have resting tremors.  Postural tremor. These occur when you try to hold a pose, such as keeping your hands outstretched.  Kinetic tremor. These occur during purposeful movement, such as trying to touch a finger to your nose.  Task-specific tremor. These may occur when you perform certain tasks such as writing, speaking, or standing.  Psychogenic tremor. These dramatically lessen or disappear when you are distracted. They can happen in people of all ages. Some types of tremors have no known cause. Tremors can also be a symptom of nervous system problems (neurological disorders) that may occur with aging. Some tremors go away with treatment, while others do not. Follow these instructions at home: Lifestyle  Limit alcohol intake to no more than 1 drink a day for nonpregnant women and 2 drinks a day for men. One drink equals 12 oz of beer, 5 oz of wine, or 1 oz of hard liquor.  Do not use any products that contain nicotine or tobacco, such as cigarettes and e-cigarettes. If you need help quitting, ask your health care provider.  Avoid extreme heat and extreme cold.  Limit your caffeine intake, as told by your health care provider.  Try to get 8 hours of sleep each night.  Find ways to manage your stress, such as meditation or yoga.      General instructions  Take over-the-counter and prescription medicines only as told by your health care provider.  Keep all follow-up visits as told by your health care provider. This is important. Contact a health care provider if you:  Develop a tremor after starting a new medicine.  Have a tremor along with other symptoms such as: ? Numbness. ? Tingling. ? Pain. ? Weakness.  Notice that your tremor gets worse.  Notice that your tremor  interferes with your day-to-day life. Summary  A tremor is trembling or shaking that you cannot control.  Most tremors affect the hands or arms.  Some types of tremors have no known cause. Others may be a symptom of nervous system problems (neurological disorders).  Make sure you discuss any tremors you have with your health care provider. This information is not intended to replace advice given to you by your health care provider. Make sure you discuss any questions you have with your health care provider. Document Revised: 04/03/2020 Document Reviewed: 04/03/2020 Elsevier Patient Education  2021 [employer].

## 2020-09-08 NOTE — Progress Notes (Signed)
Established patient visit   Patient: Keith Gibbs   DOB: 11-Aug-1960   60 y.o. Adult  MRN: [id number] Visit Date: 09/08/2020  Today's healthcare provider: [place] Ivanka Kirshner, FNP    Subjective    HPI HPI    Follow-up     Additional comments: Patient returns to office today for follow up.Patient states since last visit he is is a support group and seeing psychiatry. Patient reports that he currently seeing orthopedic to address knee pain and swelling of joint. Patient complains of today back pain radiating into his lower extremities causing numbness and tingling. Patient states today that he has been sober for the past 2 weeks and is going to [facility], patient states that he has quit drinking and drugs as of two weeks ago  No more cocaine/        Last edited by Minette Headland, CMA on 09/08/2020  1:13 PM. (History)     Patient saw Dr. Humphrey Rolls cardiologist today for stress test and echocardiogram and follow up with chest pain, he reports he is waiting on results.  Seeing Dr. Rollene Rotunda for his foot pain, paresthesias, knee pain right and back pain.   He is now seeing psychiatry at [facility] and a support group at [place]. He was placed on Cymbalta and Trazodone by psychiatry.    He is seieng Dr. Izora Ribas for his back pain. He has had MRI. He requests pain medication for back today.From care everywhere Dr. Nelly Laurence office mailed letter to patient and was told to schedule follow up and was non urgent and the office could not reach him. Patient is advised to call to schedule appointment.   Denies any loss of bowel or bladder control.  Denies saddle paresthesias.  Denies radiculopathy/ paresthesias.    He has stopped drinking and drugs he was using cocaine once a month, he states  I last used cocaine 2 weeks ago" .  He has stopped smoking cigarettes he reports.  He is being followed by Dr. Melvyn Novas for COPD and denies any worsening symptoms.  Patient  denies any fever,  body aches,chills, rash, chest pain, shortness of breath, nausea, vomiting, or diarrhea.   Denies dizziness, lightheadedness, pre syncopal or syncopal episodes.    Patient Active Problem List   Diagnosis Date Noted  . Cigarette smoker 07/21/2020  . Foot pain, bilateral 12/25/2019  . Potential exposure to STD 12/12/2019  . History of lumbar surgery 12/12/2019  . Fatigue 12/12/2019  . Penile lesion 12/12/2019  . Right testicular pain 12/12/2019  . Body mass index 28.0-28.9, adult 12/12/2019  . History of COPD 12/12/2019  . Tobacco use disorder, continuous 12/12/2019  . Hepatoma ([place]) 11/05/2019  . Testicular discomfort 07/12/2019  . Anxiety 12/10/2018  . Unstable angina ([place]) 11/30/2018  . Essential hypertension 09/18/2018  . Heart valve disorder 09/18/2018  . Hypercholesteremia 09/18/2018  . 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 Chronic anticoagulation (Plavix) 04/19/2018  . Pain medication agreement broken [facility]) 04/19/2018  . Cocaine use 04/18/2018  . Cocaine abuse ([place]) (See 04/12/18 UDS) 04/18/2018  . Marijuana abuse (See 04/12/18 UDS) 04/18/2018  . Long term current use of opiate analgesic 04/12/2018  . Cirrhosis of liver ([place]) 04/03/2018  . BPH (benign prostatic hyperplasia) 04/03/2018  . Hematuria, microscopic 04/03/2018  . Osteoarthritis of the knee (Left) 11/02/2017  . Lumbar facet syndrome (Bilateral) 11/01/2017  . Spondylosis without myelopathy or radiculopathy, lumbosacral region 11/01/2017  . Osteoarthritis of shoulder (Right) 11/01/2017  . Osteoarthritis of  acromioclavicular joint (Right) 11/01/2017  . 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 Osteoarthritis 11/01/2017  . Cervicalgia 11/01/2017  . Cervical facet syndrome 11/01/2017  . DDD (degenerative disc disease), cervical 11/01/2017  . DDD (degenerative disc disease), lumbar 11/01/2017  . 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) 09/18/2017  . Other chronic pain 09/18/2017  .  Chronic musculoskeletal pain 09/18/2017  . Chronic pain of right knee 09/11/2017  . Chronic low back pain (Secondary Area of Pain) (Bilateral) 09/11/2017  . Chronic hip pain [facility] Area of Pain) (Bilateral) (R>L) 09/11/2017  . Chronic shoulder pain (Fourth Area of Pain) (Right) 09/11/2017  . Spondylosis without myelopathy or radiculopathy, cervical region 09/11/2017  . Chronic pain syndrome 09/11/2017  . Opiate use 09/11/2017  . Screen for STD (sexually transmitted disease) 09/11/2017  . Disorder of skeletal system 09/11/2017  . Problems influencing health status 09/11/2017  . Overdose of medication, intentional self-harm, sequela ([place]) 12/28/2016  . Tobacco use disorder 12/28/2016  . Severe recurrent major depression without psychotic features ([place]) 12/27/2016  . Coronary artery disease of native artery of native heart with stable angina pectoris ([place]) 12/27/2016  . Alcohol use disorder, moderate, dependence ([place]) 12/27/2016  . Cannabis use disorder, moderate, dependence ([place]) 12/27/2016  . Angina at rest [facility]) 06/11/2016  . Chest pain 06/10/2016  . Bipolar 1 disorder ([place]) 05/27/2013  . Abnormal ejaculation 11/30/2012  . Chest pain, unspecified 11/30/2012  . Degenerative arthritis of hip 11/16/2012  . Schizoaffective disorder ([place]) 06/27/2012  . COPD GOLD ? / still smoking  06/27/2012  . Chronic hepatitis C ([place]) 06/27/2012  . GERD (gastroesophageal reflux disease) 06/27/2012  . Complete rupture of rotator cuff 02/10/2012  . Cocaine abuse in remission ([place]) 12/05/2011  . Unspecified osteoarthritis, unspecified site 07/27/2011   Past Medical History:  Diagnosis Date  . Anxiety   . Arthritis   . Bursitis   . Chronic pain   . COPD (chronic obstructive pulmonary disease) ([place])   . Depression   . GERD (gastroesophageal reflux disease)   . Hepatitis C 2012   No longer has Hep C  . Hypertension   . Lung disease   . MI (myocardial infarction) ([place])   . Stroke ([place])   .  Substance abuse [facility])    Past Surgical History:  Procedure Laterality Date  . ABDOMINAL SURGERY     removed small piece of intestines due to [facility] Diverticulosis  . APPENDECTOMY    . BACK SURGERY    . CARDIAC CATHETERIZATION Left 06/10/2016   Procedure: Left Heart Cath and Coronary Angiography;  Surgeon: Dionisio David, MD;  Location: [place] CV LAB;  Service: Cardiovascular;  Laterality: Left;  . CARDIAC CATHETERIZATION N/A 06/10/2016   Procedure: Coronary Stent Intervention;  Surgeon: Yolonda Kida, MD;  Location: [place] CV LAB;  Service: Cardiovascular;  Laterality: N/A;  . COLONOSCOPY    . COLONOSCOPY WITH PROPOFOL N/A 01/05/2017   Procedure: COLONOSCOPY WITH PROPOFOL;  Surgeon: Jonathon Bellows, MD;  Location: [facility] ENDOSCOPY;  Service: Endoscopy;  Laterality: N/A;  . COLONOSCOPY WITH PROPOFOL N/A 02/13/2020   Procedure: COLONOSCOPY WITH PROPOFOL;  Surgeon: Virgel Manifold, MD;  Location: [facility] ENDOSCOPY;  Service: Endoscopy;  Laterality: N/A;  . CORONARY ANGIOPLASTY WITH STENT PLACEMENT    . ESOPHAGOGASTRODUODENOSCOPY (EGD) WITH PROPOFOL N/A 01/05/2017   Procedure: ESOPHAGOGASTRODUODENOSCOPY (EGD) WITH PROPOFOL;  Surgeon: Jonathon Bellows, MD;  Location: [facility] ENDOSCOPY;  Service: Endoscopy;  Laterality: N/A;  . ESOPHAGOGASTRODUODENOSCOPY (EGD) WITH PROPOFOL N/A 02/13/2020  Procedure: ESOPHAGOGASTRODUODENOSCOPY (EGD) WITH PROPOFOL;  Surgeon: Virgel Manifold, MD;  Location: [facility] ENDOSCOPY;  Service: Endoscopy;  Laterality: N/A;  . INTRAVASCULAR PRESSURE WIRE/FFR STUDY N/A 12/04/2019   Procedure: INTRAVASCULAR PRESSURE WIRE/FFR STUDY;  Surgeon: Sherren Mocha, MD;  Location: [place] CV LAB;  Service: Cardiovascular;  Laterality: N/A;  . KNEE ARTHROSCOPY WITH MEDIAL MENISECTOMY Right 09/05/2017   Procedure: KNEE ARTHROSCOPY WITH MEDIAL AND LATERAL  MENISECTOMY PARTIAL SYNOVECTOMY;  Surgeon: Hessie Knows, MD;  Location: [facility] ORS;  Service: Orthopedics;  Laterality: Right;   . LEFT HEART CATH AND CORONARY ANGIOGRAPHY N/A 12/04/2019   Procedure: LEFT HEART CATH AND CORONARY ANGIOGRAPHY;  Surgeon: Sherren Mocha, MD;  Location: [place] CV LAB;  Service: Cardiovascular;  Laterality: N/A;  . SHOULDER SURGERY Right 04/09/2012  . SPINE SURGERY     Social History   Tobacco Use  . Smoking status: Former Smoker    Packs/day: 1.00    Years: 32.00    Pack years: 32.00    Types: Cigarettes  . Smokeless tobacco: Never Used  . Tobacco comment: Quit smoking 08/10/2020  Vaping Use  . Vaping Use: Never used  Substance Use Topics  . Alcohol use: Yes    Alcohol/week: 6.0 standard drinks    Types: 6 Cans of beer per week    Comment: occassionally   . Drug use: Yes    Types: Cocaine, Marijuana    Comment: last on saturday   Social History   Socioeconomic History  . Marital status: Widowed    Spouse name: Not on file  . Number of children: Not on file  . Years of education: Not on file  . Highest education level: Not on file  Occupational History  . Occupation: disability  . Occupation: stocking at gas station    Comment: 2 days per week  Tobacco Use  . Smoking status: Former Smoker    Packs/day: 1.00    Years: 32.00    Pack years: 32.00    Types: Cigarettes  . Smokeless tobacco: Never Used  . Tobacco comment: Quit smoking 08/10/2020  Vaping Use  . Vaping Use: Never used  Substance and Sexual Activity  . Alcohol use: Yes    Alcohol/week: 6.0 standard drinks    Types: 6 Cans of beer per week    Comment: occassionally   . Drug use: Yes    Types: Cocaine, Marijuana    Comment: last on saturday  . Sexual activity: Yes    Partners: Female    Birth control/protection: None  Other Topics Concern  . Not on file  Social History Narrative  . Not on file   Social Determinants of Health   Financial Resource Strain: Not on file  Food Insecurity: Not on file  Transportation Needs: Not on file  Physical Activity: Not on file  Stress: Not on file   Social Connections: Not on file  Intimate Partner Violence: Not on file   Family Status  Relation Name Status  . Mother  Alive  . Father  Deceased  . Sister  (Not Specified)  . Mat Aunt  Deceased  . Mat Uncle  Deceased  . Ethlyn Daniels  Deceased  . Annamarie Major  Deceased  . MGM  Deceased  . MGF  Deceased  . PGM  Deceased  . PGF  Deceased  . Neg Hx  (Not Specified)   Family History  Problem Relation Age of Onset  . Osteoarthritis Mother   . Heart disease Mother   . Hypertension  Mother   . Depression Mother   . Heart disease Father   . Early death Father   . Hypertension Father   . Heart attack Father   . Hypertension Sister   . Prostate cancer Neg Hx   . Bladder Cancer Neg Hx   . Kidney cancer Neg Hx    Allergies  Allergen Reactions  . Latuda [Lurasidone Hcl] Other (See Comments)    tremor   . Naproxen Hives and Swelling  . Saphris [Asenapine] Other (See Comments)    Increased tremors        Medications: Outpatient Medications Prior to Visit  Medication Sig  . albuterol (VENTOLIN HFA) 108 (90 Base) MCG/ACT inhaler Inhale 2 puffs into the lungs every 6 (six) hours as needed for wheezing or shortness of breath.  Marland Kitchen aspirin EC 81 MG tablet Take 81 mg by mouth daily.  Marland Kitchen atorvastatin (LIPITOR) 40 MG tablet Take 1 tablet (40 mg total) by mouth daily at 6 PM. (Patient taking differently: Take 40 mg by mouth at bedtime.)  . atorvastatin (LIPITOR) 80 MG tablet Take 80 mg by mouth daily.  . Budeson-Glycopyrrol-Formoterol (BREZTRI AEROSPHERE) 160-9-4.8 MCG/ACT AERO Inhale 2 puffs into the lungs 2 (two) times daily.  . cetirizine (ZYRTEC) 10 MG tablet TAKE 1 TABLETS DAILY.  . citalopram (CELEXA) 20 MG tablet Take 20 mg by mouth daily.  . clopidogrel (PLAVIX) 75 MG tablet Take 75 mg by mouth daily.  . cyclobenzaprine (FLEXERIL) 10 MG tablet TAKE (1) TABLET BY MOUTH TWICE DAILY AS NEEDED FOR SPASMS.  Marland Kitchen omeprazole (PRILOSEC) 40 MG capsule TAKE ONE CAPSULE BY MOUTH ONCE DAILY.  .  tamsulosin (FLOMAX) 0.4 MG CAPS capsule Take 0.4 mg by mouth.  . traZODone (DESYREL) 100 MG tablet Take 100 mg by mouth at bedtime.  . DULoxetine (CYMBALTA) 60 MG capsule TAKE ONE CAPSULE BY MOUTH ONCE DAILY. (Patient not taking: Reported on 09/08/2020)  . isosorbide mononitrate (IMDUR) 60 MG 24 hr tablet Take 1 tablet (60 mg total) by mouth daily. (Patient not taking: Reported on 09/08/2020)  . nitroGLYCERIN (NITROSTAT) 0.4 MG SL tablet Place 1 tablet (0.4 mg total) under the tongue every 5 (five) minutes x 3 doses as needed for chest pain. (Patient not taking: Reported on 09/08/2020)  . predniSONE (DELTASONE) 10 MG tablet Take  4 each am x 2 days,   2 each am x 2 days,  1 each am x 2 days and stop (Patient not taking: Reported on 09/08/2020)  . [DISCONTINUED] Budeson-Glycopyrrol-Formoterol (BREZTRI AEROSPHERE) 160-9-4.8 MCG/ACT AERO Inhale 2 puffs into the lungs in the morning and at bedtime.   Facility-Administered Medications Prior to Visit  Medication Dose Route Frequency Provider  . sodium chloride flush (NS) 0.9 % injection 3 mL  3 mL Intravenous Q12H Jake Bathe, FNP  . sodium chloride flush (NS) 0.9 % injection 3 mL  3 mL Intravenous Q12H Branch, Alphonse Guild, MD    Review of Systems  Constitutional: Positive for fatigue. Negative for activity change, appetite change, chills, diaphoresis, fever and unexpected weight change.  HENT: Negative.   Respiratory: Positive for chest tightness and shortness of breath. Negative for apnea, cough, choking, wheezing and stridor.   Cardiovascular: Positive for chest pain. Negative for palpitations and leg swelling.       Denies any currently, seeing Dr Humphrey Rolls cardiologist just left from testing he reports.   Gastrointestinal: Negative.   Genitourinary: Negative.  Negative for difficulty urinating.  Musculoskeletal: Positive for arthralgias, back pain, gait problem (falls reported  by patient. ), neck pain and neck stiffness. Negative for joint  swelling and myalgias.  Neurological: Positive for tremors and numbness (reports chronic paresthesias all over body for  years ).  Hematological: Negative.   Psychiatric/Behavioral: Positive for decreased concentration. Negative for agitation, confusion, dysphoric mood, hallucinations, self-injury, sleep disturbance and suicidal ideas. The patient is nervous/anxious and is hyperactive.     Last CBC Lab Results  Component Value Date   WBC 7.2 09/08/2020   HGB 14.6 09/08/2020   HCT 42.1 09/08/2020   MCV 94 09/08/2020   MCH 32.6 09/08/2020   RDW 12.6 09/08/2020   PLT 175 62/83/1517   Last metabolic panel Lab Results  Component Value Date   GLUCOSE 82 09/08/2020   NA 139 09/08/2020   K 4.6 09/08/2020   CL 99 09/08/2020   CO2 23 09/08/2020   BUN 14 09/08/2020   CREATININE 1.04 09/08/2020   GFRNONAA 78 09/08/2020   GFRAA 90 09/08/2020   CALCIUM 9.1 09/08/2020   PROT 6.2 09/08/2020   ALBUMIN 4.4 09/08/2020   LABGLOB 1.8 09/08/2020   AGRATIO 2.4 (H) 09/08/2020   BILITOT 0.4 09/08/2020   ALKPHOS 59 09/08/2020   AST 18 09/08/2020   ALT 15 09/08/2020   ANIONGAP 10 12/02/2019   Last lipids Lab Results  Component Value Date   CHOL 116 12/12/2019   HDL 38 (L) 12/12/2019   LDLCALC 61 12/12/2019   TRIG 87 12/12/2019   CHOLHDL 3.1 12/12/2019   Last hemoglobin A1c Lab Results  Component Value Date   HGBA1C 5.2 12/28/2016   Last thyroid functions Lab Results  Component Value Date   TSH 1.610 09/08/2020   Last vitamin D Lab Results  Component Value Date   25OHVITD2 <1.0 09/11/2017   25OHVITD3 35 09/11/2017   Last vitamin B12 and Folate Lab Results  Component Value Date   VITAMINB12 593 09/08/2020       Objective    BP 136/80   Pulse 62 Comment: recheck manual  Temp (!) 97.3 F (36.3 C) (Oral)   Resp 16   Wt 235 lb 9.6 oz (106.9 kg)   SpO2 100%   BMI 29.45 kg/m  BP Readings from Last 3 Encounters:  09/08/20 136/80  08/21/20 122/62  07/21/20 118/72    Wt Readings from Last 3 Encounters:  09/08/20 235 lb 9.6 oz (106.9 kg)  08/21/20 219 lb (99.3 kg)  07/21/20 208 lb 12.8 oz (94.7 kg)       Physical Exam Vitals and nursing note reviewed.  Constitutional:      General: He is not in acute distress.    Appearance: He is not ill-appearing, toxic-appearing or diaphoretic.     Comments: Disheveled appearance   HENT:     Head: Normocephalic and atraumatic.     Right Ear: External ear normal.     Left Ear: External ear normal.     Nose: Nose normal. No congestion or rhinorrhea.     Mouth/Throat:     Mouth: Mucous membranes are moist.     Pharynx: No oropharyngeal exudate or posterior oropharyngeal erythema.  Eyes:     General: No scleral icterus.       Right eye: No discharge.        Left eye: No discharge.     Conjunctiva/sclera: Conjunctivae normal.     Pupils: Pupils are equal, round, and reactive to light.  Neck:     Vascular: No carotid bruit.  Cardiovascular:     Rate and Rhythm:  Normal rate and regular rhythm.     Pulses: Normal pulses.     Heart sounds: Normal heart sounds.  Pulmonary:     Effort: Pulmonary effort is normal. No respiratory distress.     Breath sounds: Normal breath sounds. No stridor. No wheezing, rhonchi or rales.  Chest:     Chest wall: No tenderness.  Abdominal:     General: There is no distension.     Palpations: Abdomen is soft.     Tenderness: There is no abdominal tenderness. There is no guarding.  Musculoskeletal:     Cervical back: Normal range of motion and neck supple.  Skin:    General: Skin is warm.     Findings: No rash.  Neurological:     Mental Status: He is alert and oriented to person, place, and time.     Motor: No weakness.     Gait: Gait normal.  Psychiatric:        Attention and Perception: Attention and perception normal.        Mood and Affect: Mood normal.        Speech: Speech normal.        Behavior: Behavior is hyperactive. Behavior is cooperative.        Thought  Content: Thought content normal.        Cognition and Memory: Cognition and memory normal.        Judgment: Judgment normal.      Results for orders placed or performed in visit on 09/08/20  TSH  Result Value Ref Range   TSH 1.610 0.450 - 4.500 uIU/mL  CBC with Differential/Platelet  Result Value Ref Range   WBC 7.2 3.4 - 10.8 x10E3/uL   RBC 4.48 4.14 - 5.80 x10E6/uL   Hemoglobin 14.6 13.0 - 17.7 g/dL   Hematocrit 42.1 37.5 - 51.0 %   MCV 94 79 - 97 fL   MCH 32.6 26.6 - 33.0 pg   MCHC 34.7 31.5 - 35.7 g/dL   RDW 12.6 11.6 - 15.4 %   Platelets 175 150 - 450 x10E3/uL   Neutrophils 57 Not Estab. %   Lymphs 30 Not Estab. %   Monocytes 7 Not Estab. %   Eos 4 Not Estab. %   Basos 1 Not Estab. %   Neutrophils Absolute 4.1 1.4 - 7.0 x10E3/uL   Lymphocytes Absolute 2.2 0.7 - 3.1 x10E3/uL   Monocytes Absolute 0.5 0.1 - 0.9 x10E3/uL   EOS (ABSOLUTE) 0.3 0.0 - 0.4 x10E3/uL   Basophils Absolute 0.1 0.0 - 0.2 x10E3/uL   Immature Granulocytes 1 Not Estab. %   Immature Grans (Abs) 0.0 0.0 - 0.1 x10E3/uL  Comprehensive metabolic panel  Result Value Ref Range   Glucose 82 65 - 99 mg/dL   BUN 14 8 - 27 mg/dL   Creatinine, Ser 1.04 0.76 - 1.27 mg/dL   GFR calc non Af Amer 78 >59 mL/min/1.73   GFR calc Af Amer 90 >59 mL/min/1.73   BUN/Creatinine Ratio 13 10 - 24   Sodium 139 134 - 144 mmol/L   Potassium 4.6 3.5 - 5.2 mmol/L   Chloride 99 96 - 106 mmol/L   CO2 23 20 - 29 mmol/L   Calcium 9.1 8.6 - 10.2 mg/dL   Total Protein 6.2 6.0 - 8.5 g/dL   Albumin 4.4 3.8 - 4.9 g/dL   Globulin, Total 1.8 1.5 - 4.5 g/dL   Albumin/Globulin Ratio 2.4 (H) 1.2 - 2.2   Bilirubin Total 0.4 0.0 - 1.2 mg/dL  Alkaline Phosphatase 59 44 - 121 IU/L   AST 18 0 - 40 IU/L   ALT 15 0 - 44 IU/L  B12  Result Value Ref Range   Vitamin B-12 593 232 - 1,245 pg/mL    Assessment & Plan       1. Tremors of nervous system Suspect that the tremors are from history of chronic substance abuse/ withdrawals if he  is decreasing use.  Patient reports more falls than normal denies any injury that is new.   He requests neurology referral. Will place.  - Ambulatory referral to Neurology - TSH - CBC with Differential/Platelet - Comprehensive metabolic panel - B14  2. Need for influenza vaccination Updated influenza due today in office.  - Flu Vaccine QUAD 36+ mos IM  3. Cocaine abuse ([place]) (See 04/12/18 UDS) Marijuana use history. Recommend cessation and rehabilitation/ substance abuse . Patient declined.  Advised to keep follow up's with his cardiologist for his results of ongoing testing.   Advised patient for any pain medication he would have to go through his specialist, given history this provider is not comfortable prescribing narcotics and he has been referred to pain management at his request however pain management at [facility] and Dr. Humphrey Rolls in pain management have declined referrals. Parke Poisson in referrals is following up.Patinet reports he use to see Dr. Humphrey Rolls in pain management.    Red Flags discussed. The patient was given clear instructions to go to ER or return to medical center if any red flags develop, symptoms do not improve, worsen or new problems develop. They verbalized understanding.     Return in about 3 months (around 12/06/2020), or if symptoms worsen or fail to improve, for at any time for any worsening symptoms, Go to Emergency room/ urgent care if worse.    Red Flags discussed. The patient was given clear instructions to go to ER or return to medical center if any red flags develop, symptoms do not improve, worsen or new problems develop. They verbalized understanding.  The entirety of the information documented in the History of Present Illness, Review of Systems and Physical Exam were personally obtained by me. Portions of this information were initially documented by the CMA and reviewed by me for thoroughness and accuracy.      Marcille Buffy, [place] [phone] (phone) [phone] (fax)  [place]

## 2020-09-09 ENCOUNTER — Telehealth: Payer: Self-pay

## 2020-09-09 LAB — COMPREHENSIVE METABOLIC PANEL
ALT: 15 IU/L (ref 0–44)
AST: 18 IU/L (ref 0–40)
Albumin/Globulin Ratio: 2.4 — ABNORMAL HIGH (ref 1.2–2.2)
Albumin: 4.4 g/dL (ref 3.8–4.9)
Alkaline Phosphatase: 59 IU/L (ref 44–121)
BUN/Creatinine Ratio: 13 (ref 10–24)
BUN: 14 mg/dL (ref 8–27)
Bilirubin Total: 0.4 mg/dL (ref 0.0–1.2)
CO2: 23 mmol/L (ref 20–29)
Calcium: 9.1 mg/dL (ref 8.6–10.2)
Chloride: 99 mmol/L (ref 96–106)
Creatinine, Ser: 1.04 mg/dL (ref 0.76–1.27)
GFR calc Af Amer: 90 mL/min/{1.73_m2} (ref >59)
GFR calc non Af Amer: 78 mL/min/{1.73_m2} (ref >59)
Globulin, Total: 1.8 g/dL (ref 1.5–4.5)
Glucose: 82 mg/dL (ref 65–99)
Potassium: 4.6 mmol/L (ref 3.5–5.2)
Sodium: 139 mmol/L (ref 134–144)
Total Protein: 6.2 g/dL (ref 6.0–8.5)

## 2020-09-09 LAB — CBC WITH DIFFERENTIAL/PLATELET
Basophils Absolute: 0.1 10*3/uL (ref 0.0–0.2)
Basos: 1 %
EOS (ABSOLUTE): 0.3 10*3/uL (ref 0.0–0.4)
Eos: 4 %
Hematocrit: 42.1 % (ref 37.5–51.0)
Hemoglobin: 14.6 g/dL (ref 13.0–17.7)
Immature Grans (Abs): 0 10*3/uL (ref 0.0–0.1)
Immature Granulocytes: 1 %
Lymphocytes Absolute: 2.2 10*3/uL (ref 0.7–3.1)
Lymphs: 30 %
MCH: 32.6 pg (ref 26.6–33.0)
MCHC: 34.7 g/dL (ref 31.5–35.7)
MCV: 94 fL (ref 79–97)
Monocytes Absolute: 0.5 10*3/uL (ref 0.1–0.9)
Monocytes: 7 %
Neutrophils Absolute: 4.1 10*3/uL (ref 1.4–7.0)
Neutrophils: 57 %
Platelets: 175 10*3/uL (ref 150–450)
RBC: 4.48 x10E6/uL (ref 4.14–5.80)
RDW: 12.6 % (ref 11.6–15.4)
WBC: 7.2 10*3/uL (ref 3.4–10.8)

## 2020-09-09 LAB — TSH: TSH: 1.61 u[IU]/mL (ref 0.450–4.500)

## 2020-09-09 LAB — VITAMIN B12: Vitamin B-12: 593 pg/mL (ref 232–1245)

## 2020-09-09 NOTE — Telephone Encounter (Signed)
Copied from [place] [id number]. Topic: Referral - Question >> Sep 09, 2020  8:44 AM Lennox Solders wrote: Reason for CRM: Juliann Pulse [facility] is calling their office received a letter asking why the pt was denied services. The pt was never referred to [facility] but to a dr Chancy Milroy, Lisa Roca

## 2020-09-09 NOTE — Telephone Encounter (Signed)
FYI. KW 

## 2020-09-09 NOTE — Progress Notes (Signed)
Labs within normal limits other than glucose mild elevation. Watch diet and increase exercise lifestyle changes advised. B12 ok.

## 2020-09-09 NOTE — Telephone Encounter (Signed)
Referral Notes Number of Notes: 4  . Type Date User Summary Attachment  General 01/16/2020  8:56 AM Parke Poisson - -  Note   Dr Humphrey Rolls and [facility] have declined referral         Referral notes as above, Judson Roch in referrals would you follow up on this, it says Dr. Humphrey Rolls and [facility] pain clinic both declined referral. Patient needs pain clinic, as this provider will not prescribe given risk factors and substance abuse history.

## 2020-09-14 ENCOUNTER — Other Ambulatory Visit: Payer: Self-pay | Admitting: Internal Medicine

## 2020-09-14 ENCOUNTER — Telehealth: Payer: Self-pay | Admitting: *Deleted

## 2020-09-14 NOTE — Telephone Encounter (Signed)
Psychiatry is managing his psychiatry medications he should call their office

## 2020-09-14 NOTE — Telephone Encounter (Signed)
Patient calls for lab results. Reviewed results and provider's note with the patient. He had further concerns to discuss including: -he feels Cymbalta is not working enough decreasing his nicotene cravings. Stated his wife is on Bupropion, he is requesting a change to Bupropion b/c of how well she is doing. Continues to have lightheadedness with getting up and moving, advised to stand up slowly and wait several seconds before walking, increase daily water intake. Has Neurology appointment on 10/27/20. Appointment on 3/10 with Dr. Lonna Duval for scan results. Routing to provider for any further advice if needed.  Pharmacy on file.

## 2020-09-15 NOTE — Telephone Encounter (Signed)
Patient has been advised. KW 

## 2020-09-18 ENCOUNTER — Other Ambulatory Visit: Payer: Self-pay | Admitting: Adult Health

## 2020-09-18 ENCOUNTER — Other Ambulatory Visit: Payer: Self-pay | Admitting: Family Medicine

## 2020-09-18 DIAGNOSIS — K219 Gastro-esophageal reflux disease without esophagitis: Secondary | ICD-10-CM

## 2020-09-18 DIAGNOSIS — F32A Depression, unspecified: Secondary | ICD-10-CM

## 2020-09-18 DIAGNOSIS — M5136 Other intervertebral disc degeneration, lumbar region: Secondary | ICD-10-CM

## 2020-09-18 NOTE — Telephone Encounter (Signed)
Requested medication (s) are due for refill today: yes  Requested medication (s) are on the active medication list: yes  Last refill:  08/19/20 #60 0 refills  Future visit scheduled: yes  Notes to clinic:  not delegated per protocol     Requested Prescriptions  Pending Prescriptions Disp Refills   cyclobenzaprine (FLEXERIL) 10 MG tablet [Pharmacy Med Name: CYCLOBENZAPRINE 10 MG TABLET] 60 tablet 0    Sig: TAKE (1) TABLET BY MOUTH TWICE DAILY AS NEEDED FOR SPASMS.      Not Delegated - Analgesics:  Muscle Relaxants Failed - 09/18/2020 10:51 AM      Failed - This refill cannot be delegated      Passed - Valid encounter within last 6 months    Recent Outpatient Visits           1 week ago Tremors of nervous system   [facility] [place], Kelby Aline, FNP   9 months ago Chronic hepatitis C without hepatic coma ([place])   [place], Kelby Aline, FNP   10 months ago Encounter for routine adult health examination without abnormal findings   [facility], Kelby Aline, FNP   1 year ago [place], Enobong, MD   1 year ago DDD (degenerative disc disease), lumbar   [place], Enobong, MD       Future Appointments             In 2 months Fl[place], Kelby Aline, [place], [facility]              Signed Prescriptions Disp Refills   DULoxetine (CYMBALTA) 60 MG capsule 30 capsule 0    Sig: TAKE ONE CAPSULE BY MOUTH ONCE DAILY.      Psychiatry: Antidepressants - SNRI Passed - 09/18/2020 10:51 AM      Passed - Completed PHQ-2 or PHQ-9 in the last 360 days      Passed - Last BP in normal range    BP Readings from Last 1 Encounters:  09/08/20 136/80          Passed - Valid encounter within last 6 months    Recent Outpatient Visits           1 week ago Tremors of nervous system   [facility]  [place], Kelby Aline, FNP   9 months ago Chronic hepatitis C without hepatic coma ([place])   [place] Fl[place]um, Kelby Aline, FNP   10 months ago Encounter for routine adult health examination without abnormal findings   [facility], Kelby Aline, FNP   1 year ago [place], Charlane Ferretti, MD   1 year ago DDD (degenerative disc disease), lumbar   [place], Enobong, MD       Future Appointments             In 2 months Flinchum, Kelby Aline, [place], [facility]               omeprazole (PRILOSEC) 40 MG capsule 30 capsule 0    Sig: TAKE ONE CAPSULE BY MOUTH ONCE DAILY.      Gastroenterology: Proton Pump Inhibitors Passed - 09/18/2020 10:51 AM      Passed - Valid encounter within last 12 months    Recent Outpatient Visits  1 week ago Tremors of nervous system   [facility] Fl[place], Kelby Aline, FNP   9 months ago Chronic hepatitis C without hepatic coma [facility])   [place] Fl[place]um, Kelby Aline, FNP   10 months ago Encounter for routine adult health examination without abnormal findings   [facility], Kelby Aline, FNP   1 year ago [place], Enobong, MD   1 year ago DDD (degenerative disc disease), lumbar   [place], Enobong, MD       Future Appointments             In 2 months Flinchum, Kelby Aline, [place], [facility]               cetirizine (ZYRTEC) 10 MG tablet 30 tablet 0    Sig: TAKE 1 TABLETS DAILY.      Ear, Nose, and Throat:  Antihistamines Passed - 09/18/2020 10:51 AM      Passed - Valid encounter within last 12 months    Recent Outpatient Visits           1 week ago Tremors of nervous system   [facility] Fl[place]um,  Kelby Aline, FNP   9 months ago Chronic hepatitis C without hepatic coma ([place])   [place] Flinchum, Kelby Aline, FNP   10 months ago Encounter for routine adult health examination without abnormal findings   [facility], Kelby Aline, FNP   1 year ago [place], Enobong, MD   1 year ago DDD (degenerative disc disease), lumbar   [place], Enobong, MD       Future Appointments             In 2 months Flinchum, Kelby Aline, [place], [place]

## 2020-09-18 NOTE — Telephone Encounter (Signed)
Future visit in 2 months  

## 2020-09-18 NOTE — Telephone Encounter (Signed)
   Notes to clinic:  medication filled by historical provider  Review for refill    Requested Prescriptions  Pending Prescriptions Disp Refills   tamsulosin (FLOMAX) 0.4 MG CAPS capsule [Pharmacy Med Name: TAMSULOSIN HCL 0.4 MG CAPSULE] 30 capsule 0    Sig: TAKE 1 CAPSULE BY MOUTH DAILY.      Urology: Alpha-Adrenergic Blocker Passed - 09/18/2020 10:51 AM      Passed - Last BP in normal range    BP Readings from Last 1 Encounters:  09/08/20 136/80          Passed - Valid encounter within last 12 months    Recent Outpatient Visits           1 week ago Tremors of nervous system   [facility], Kelby Aline, FNP   9 months ago Chronic hepatitis C without hepatic coma ([place])   [place] Flinchum, Kelby Aline, FNP   10 months ago Encounter for routine adult health examination without abnormal findings   [facility], Kelby Aline, FNP   1 year ago [place], Enobong, MD   1 year ago DDD (degenerative disc disease), lumbar   [place], Enobong, MD       Future Appointments             In 2 months Flinchum, Kelby Aline, [place], [place]

## 2020-09-24 ENCOUNTER — Encounter (HOSPITAL_COMMUNITY): Payer: Self-pay | Admitting: Emergency Medicine

## 2020-09-24 ENCOUNTER — Emergency Department (HOSPITAL_COMMUNITY): Payer: Medicaid Other

## 2020-09-24 ENCOUNTER — Other Ambulatory Visit: Payer: Self-pay

## 2020-09-24 ENCOUNTER — Emergency Department (HOSPITAL_COMMUNITY)
Admission: EM | Admit: 2020-09-24 | Discharge: 2020-09-24 | Payer: Medicaid Other | Attending: Emergency Medicine | Admitting: Emergency Medicine

## 2020-09-24 DIAGNOSIS — S0003XA Contusion of scalp, initial encounter: Secondary | ICD-10-CM | POA: Diagnosis not present

## 2020-09-24 DIAGNOSIS — R45851 Suicidal ideations: Secondary | ICD-10-CM | POA: Diagnosis not present

## 2020-09-24 DIAGNOSIS — Z79899 Other long term (current) drug therapy: Secondary | ICD-10-CM | POA: Diagnosis not present

## 2020-09-24 DIAGNOSIS — Y9339 Activity, other involving climbing, rappelling and jumping off: Secondary | ICD-10-CM | POA: Diagnosis not present

## 2020-09-24 DIAGNOSIS — Z951 Presence of aortocoronary bypass graft: Secondary | ICD-10-CM | POA: Diagnosis not present

## 2020-09-24 DIAGNOSIS — Z7951 Long term (current) use of inhaled steroids: Secondary | ICD-10-CM | POA: Insufficient documentation

## 2020-09-24 DIAGNOSIS — I252 Old myocardial infarction: Secondary | ICD-10-CM | POA: Insufficient documentation

## 2020-09-24 DIAGNOSIS — I25119 Atherosclerotic heart disease of native coronary artery with unspecified angina pectoris: Secondary | ICD-10-CM | POA: Insufficient documentation

## 2020-09-24 DIAGNOSIS — M549 Dorsalgia, unspecified: Secondary | ICD-10-CM | POA: Insufficient documentation

## 2020-09-24 DIAGNOSIS — M542 Cervicalgia: Secondary | ICD-10-CM | POA: Diagnosis not present

## 2020-09-24 DIAGNOSIS — Z7982 Long term (current) use of aspirin: Secondary | ICD-10-CM | POA: Diagnosis not present

## 2020-09-24 DIAGNOSIS — Y30XXXA Falling, jumping or pushed from a high place, undetermined intent, initial encounter: Secondary | ICD-10-CM | POA: Insufficient documentation

## 2020-09-24 DIAGNOSIS — Y92149 Unspecified place in prison as the place of occurrence of the external cause: Secondary | ICD-10-CM | POA: Insufficient documentation

## 2020-09-24 DIAGNOSIS — Z8673 Personal history of transient ischemic attack (TIA), and cerebral infarction without residual deficits: Secondary | ICD-10-CM | POA: Insufficient documentation

## 2020-09-24 DIAGNOSIS — I10 Essential (primary) hypertension: Secondary | ICD-10-CM | POA: Insufficient documentation

## 2020-09-24 DIAGNOSIS — Z87891 Personal history of nicotine dependence: Secondary | ICD-10-CM | POA: Diagnosis not present

## 2020-09-24 DIAGNOSIS — J449 Chronic obstructive pulmonary disease, unspecified: Secondary | ICD-10-CM | POA: Insufficient documentation

## 2020-09-24 DIAGNOSIS — S0990XA Unspecified injury of head, initial encounter: Secondary | ICD-10-CM | POA: Diagnosis present

## 2020-09-24 LAB — COMPREHENSIVE METABOLIC PANEL
ALT: 19 U/L (ref 0–44)
AST: 20 U/L (ref 15–41)
Albumin: 4.1 g/dL (ref 3.5–5.0)
Alkaline Phosphatase: 59 U/L (ref 38–126)
Anion gap: 10 (ref 5–15)
BUN: 20 mg/dL (ref 6–20)
CO2: 24 mmol/L (ref 22–32)
Calcium: 9.2 mg/dL (ref 8.9–10.3)
Chloride: 102 mmol/L (ref 98–111)
Creatinine, Ser: 1.19 mg/dL (ref 0.61–1.24)
GFR, Estimated: >60 mL/min (ref >60)
Glucose, Bld: 146 mg/dL — ABNORMAL HIGH (ref 70–99)
Potassium: 4.1 mmol/L (ref 3.5–5.1)
Sodium: 136 mmol/L (ref 135–145)
Total Bilirubin: 1.4 mg/dL — ABNORMAL HIGH (ref 0.3–1.2)
Total Protein: 7.1 g/dL (ref 6.5–8.1)

## 2020-09-24 LAB — CBC
HCT: 47.8 % (ref 39.0–52.0)
Hemoglobin: 16.5 g/dL (ref 13.0–17.0)
MCH: 32.3 pg (ref 26.0–34.0)
MCHC: 34.5 g/dL (ref 30.0–36.0)
MCV: 93.5 fL (ref 80.0–100.0)
Platelets: 213 10*3/uL (ref 150–400)
RBC: 5.11 MIL/uL (ref 4.22–5.81)
RDW: 13 % (ref 11.5–15.5)
WBC: 9.9 10*3/uL (ref 4.0–10.5)
nRBC: 0 % (ref 0.0–0.2)

## 2020-09-24 LAB — ETHANOL: Alcohol, Ethyl (B): <10 mg/dL (ref <10)

## 2020-09-24 LAB — SALICYLATE LEVEL: Salicylate Lvl: <7 mg/dL — ABNORMAL LOW (ref 7.0–30.0)

## 2020-09-24 LAB — ACETAMINOPHEN LEVEL: Acetaminophen (Tylenol), Serum: <10 ug/mL — ABNORMAL LOW (ref 10–30)

## 2020-09-24 NOTE — ED Notes (Signed)
Pt states "I don't want to be here any longer. I tried jumping off my top bunk and hit my head." Pt c/o dizziness and back pain. [facility]. Deputy remains at bedside, states has suicide cells for patients if needed.

## 2020-09-24 NOTE — ED Triage Notes (Signed)
Pt from the jail. Pt states "I dont wana be here anymore" pt states he tried commiting suicide by jumping off the top bunk landing on his head in the jail. Pt c/o head pain, back pain, neck pain, dizziness. Pt has bandages on left arm and left ring finger.

## 2020-09-24 NOTE — ED Provider Notes (Signed)
[place] Provider Note   CSN: [id number] Arrival date & time: 09/24/20  1342     History Chief Complaint  Patient presents with  . Medical Clearance    Keith Gibbs is a 60 y.o. male.  60 year old male brought in by sheriff from jail after jumping from the top bunk, landing on his head in attempt to end his life today. Patient reports multiple attempts in the past. Complains of pain in his head, neck, back. No LOC, is prescribed Plavix. No other complaints or concerns.         Past Medical History:  Diagnosis Date  . Anxiety   . Arthritis   . Bursitis   . Chronic pain   . COPD (chronic obstructive pulmonary disease) ([place])   . Depression   . GERD (gastroesophageal reflux disease)   . Hepatitis C 2012   No longer has Hep C  . Hypertension   . Lung disease   . MI (myocardial infarction) ([place])   . Stroke ([place])   . Substance abuse [facility])     Patient Active Problem List   Diagnosis Date Noted  . Cigarette smoker 07/21/2020  . Foot pain, bilateral 12/25/2019  . Potential exposure to STD 12/12/2019  . History of lumbar surgery 12/12/2019  . Fatigue 12/12/2019  . Penile lesion 12/12/2019  . Right testicular pain 12/12/2019  . Body mass index 28.0-28.9, adult 12/12/2019  . History of COPD 12/12/2019  . Tobacco use disorder, continuous 12/12/2019  . Hepatoma ([place]) 11/05/2019  . Testicular discomfort 07/12/2019  . Anxiety 12/10/2018  . Unstable angina ([place]) 11/30/2018  . Essential hypertension 09/18/2018  . Heart valve disorder 09/18/2018  . Hypercholesteremia 09/18/2018  . 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 Chronic anticoagulation (Plavix) 04/19/2018  . Pain medication agreement broken [facility]) 04/19/2018  . Cocaine use 04/18/2018  . Cocaine abuse ([place]) (See 04/12/18 UDS) 04/18/2018  . Marijuana abuse (See 04/12/18 UDS) 04/18/2018  . Long term current use of opiate analgesic 04/12/2018  . Cirrhosis of liver ([place]) 04/03/2018  .  BPH (benign prostatic hyperplasia) 04/03/2018  . Hematuria, microscopic 04/03/2018  . Osteoarthritis of the knee (Left) 11/02/2017  . Lumbar facet syndrome (Bilateral) 11/01/2017  . Spondylosis without myelopathy or radiculopathy, lumbosacral region 11/01/2017  . Osteoarthritis of shoulder (Right) 11/01/2017  . Osteoarthritis of acromioclavicular joint (Right) 11/01/2017  . 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 Osteoarthritis 11/01/2017  . Cervicalgia 11/01/2017  . Cervical facet syndrome 11/01/2017  . DDD (degenerative disc disease), cervical 11/01/2017  . DDD (degenerative disc disease), lumbar 11/01/2017  . 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) 09/18/2017  . Other chronic pain 09/18/2017  . Chronic musculoskeletal pain 09/18/2017  . Chronic pain of right knee 09/11/2017  . Chronic low back pain (Secondary Area of Pain) (Bilateral) 09/11/2017  . Chronic hip pain [facility] Area of Pain) (Bilateral) (R>L) 09/11/2017  . Chronic shoulder pain (Fourth Area of Pain) (Right) 09/11/2017  . Spondylosis without myelopathy or radiculopathy, cervical region 09/11/2017  . Chronic pain syndrome 09/11/2017  . Opiate use 09/11/2017  . Screen for STD (sexually transmitted disease) 09/11/2017  . Disorder of skeletal system 09/11/2017  . Problems influencing health status 09/11/2017  . Overdose of medication, intentional self-harm, sequela ([place]) 12/28/2016  . Tobacco use disorder 12/28/2016  . Severe recurrent major depression without psychotic features ([place]) 12/27/2016  . Coronary artery disease of native artery of native heart with stable angina pectoris ([place]) 12/27/2016  .  Alcohol use disorder, moderate, dependence ([place]) 12/27/2016  . Cannabis use disorder, moderate, dependence ([place]) 12/27/2016  . Angina at rest [facility]) 06/11/2016  . Chest pain 06/10/2016  . Bipolar 1 disorder ([place]) 05/27/2013  . Abnormal ejaculation 11/30/2012  . Chest pain, unspecified 11/30/2012   . Degenerative arthritis of hip 11/16/2012  . Schizoaffective disorder ([place]) 06/27/2012  . COPD GOLD ? / still smoking  06/27/2012  . Chronic hepatitis C ([place]) 06/27/2012  . GERD (gastroesophageal reflux disease) 06/27/2012  . Complete rupture of rotator cuff 02/10/2012  . Cocaine abuse in remission ([place]) 12/05/2011  . Unspecified osteoarthritis, unspecified site 07/27/2011    Past Surgical History:  Procedure Laterality Date  . ABDOMINAL SURGERY     removed small piece of intestines due to [facility] Diverticulosis  . APPENDECTOMY    . BACK SURGERY    . CARDIAC CATHETERIZATION Left 06/10/2016   Procedure: Left Heart Cath and Coronary Angiography;  Surgeon: Dionisio David, MD;  Location: [place] CV LAB;  Service: Cardiovascular;  Laterality: Left;  . CARDIAC CATHETERIZATION N/A 06/10/2016   Procedure: Coronary Stent Intervention;  Surgeon: Yolonda Kida, MD;  Location: [place] CV LAB;  Service: Cardiovascular;  Laterality: N/A;  . COLONOSCOPY    . COLONOSCOPY WITH PROPOFOL N/A 01/05/2017   Procedure: COLONOSCOPY WITH PROPOFOL;  Surgeon: Jonathon Bellows, MD;  Location: [facility] ENDOSCOPY;  Service: Endoscopy;  Laterality: N/A;  . COLONOSCOPY WITH PROPOFOL N/A 02/13/2020   Procedure: COLONOSCOPY WITH PROPOFOL;  Surgeon: Virgel Manifold, MD;  Location: [facility] ENDOSCOPY;  Service: Endoscopy;  Laterality: N/A;  . CORONARY ANGIOPLASTY WITH STENT PLACEMENT    . ESOPHAGOGASTRODUODENOSCOPY (EGD) WITH PROPOFOL N/A 01/05/2017   Procedure: ESOPHAGOGASTRODUODENOSCOPY (EGD) WITH PROPOFOL;  Surgeon: Jonathon Bellows, MD;  Location: [facility] ENDOSCOPY;  Service: Endoscopy;  Laterality: N/A;  . ESOPHAGOGASTRODUODENOSCOPY (EGD) WITH PROPOFOL N/A 02/13/2020   Procedure: ESOPHAGOGASTRODUODENOSCOPY (EGD) WITH PROPOFOL;  Surgeon: Virgel Manifold, MD;  Location: [facility] ENDOSCOPY;  Service: Endoscopy;  Laterality: N/A;  . INTRAVASCULAR PRESSURE WIRE/FFR STUDY N/A 12/04/2019   Procedure: INTRAVASCULAR PRESSURE  WIRE/FFR STUDY;  Surgeon: Sherren Mocha, MD;  Location: [place] CV LAB;  Service: Cardiovascular;  Laterality: N/A;  . KNEE ARTHROSCOPY WITH MEDIAL MENISECTOMY Right 09/05/2017   Procedure: KNEE ARTHROSCOPY WITH MEDIAL AND LATERAL  MENISECTOMY PARTIAL SYNOVECTOMY;  Surgeon: Hessie Knows, MD;  Location: [facility] ORS;  Service: Orthopedics;  Laterality: Right;  . LEFT HEART CATH AND CORONARY ANGIOGRAPHY N/A 12/04/2019   Procedure: LEFT HEART CATH AND CORONARY ANGIOGRAPHY;  Surgeon: Sherren Mocha, MD;  Location: [place] CV LAB;  Service: Cardiovascular;  Laterality: N/A;  . SHOULDER SURGERY Right 04/09/2012  . SPINE SURGERY         Family History  Problem Relation Age of Onset  . Osteoarthritis Mother   . Heart disease Mother   . Hypertension Mother   . Depression Mother   . Heart disease Father   . Early death Father   . Hypertension Father   . Heart attack Father   . Hypertension Sister   . Prostate cancer Neg Hx   . Bladder Cancer Neg Hx   . Kidney cancer Neg Hx     Social History   Tobacco Use  . Smoking status: Former Smoker    Packs/day: 1.00    Years: 32.00    Pack years: 32.00    Types: Cigarettes  . Smokeless tobacco: Never Used  . Tobacco comment: Quit smoking 08/10/2020  Vaping Use  . Vaping Use: Never used  Substance Use Topics  . Alcohol use: Yes    Alcohol/week: 6.0 standard drinks    Types: 6 Cans of beer per week    Comment: occassionally   . Drug use: Yes    Types: Cocaine, Marijuana    Comment: last on saturday    Home Medications Prior to Admission medications   Medication Sig Start Date End Date Taking? Authorizing Provider  albuterol (VENTOLIN HFA) 108 (90 Base) MCG/ACT inhaler Inhale 2 puffs into the lungs every 6 (six) hours as needed for wheezing or shortness of breath. 05/28/19   Charlott Rakes, MD  aspirin EC 81 MG tablet Take 81 mg by mouth daily.    [provider]  atorvastatin (LIPITOR) 40 MG tablet Take 1 tablet (40 mg  total) by mouth daily at 6 PM. Patient taking differently: Take 40 mg by mouth at bedtime. 12/30/16   Pucilowska, Jolanta B, MD  atorvastatin (LIPITOR) 80 MG tablet Take 80 mg by mouth daily. 08/28/20   [provider]  Budeson-Glycopyrrol-Formoterol (BREZTRI AEROSPHERE) 160-9-4.8 MCG/ACT AERO Inhale 2 puffs into the lungs 2 (two) times daily. 08/06/20   Tanda Rockers, MD  cetirizine (ZYRTEC) 10 MG tablet TAKE 1 TABLETS DAILY. 09/18/20   Flinchum, Kelby Aline, FNP  citalopram (CELEXA) 20 MG tablet Take 20 mg by mouth daily. 09/02/20   [provider]  clopidogrel (PLAVIX) 75 MG tablet Take 75 mg by mouth daily.    [provider]  cyclobenzaprine (FLEXERIL) 10 MG tablet TAKE (1) TABLET BY MOUTH TWICE DAILY AS NEEDED FOR SPASMS. 09/18/20   Flinchum, Kelby Aline, FNP  DULoxetine (CYMBALTA) 60 MG capsule TAKE ONE CAPSULE BY MOUTH ONCE DAILY. 09/18/20   Flinchum, Kelby Aline, FNP  isosorbide mononitrate (IMDUR) 60 MG 24 hr tablet Take 1 tablet (60 mg total) by mouth daily. Patient not taking: Reported on 09/08/2020 12/04/19   Sherren Mocha, MD  nitroGLYCERIN (NITROSTAT) 0.4 MG SL tablet Place 1 tablet (0.4 mg total) under the tongue every 5 (five) minutes x 3 doses as needed for chest pain. Patient not taking: Reported on 09/08/2020 12/10/18   Charlott Rakes, MD  omeprazole (PRILOSEC) 40 MG capsule TAKE ONE CAPSULE BY MOUTH ONCE DAILY. 09/18/20   Flinchum, Kelby Aline, FNP  predniSONE (DELTASONE) 10 MG tablet TAKE 4 TABS BY MOUTH EVERY MORNING FOR 2 DAYS; 2 TABS FOR 2 DAYS; THEN 1 TAB FOR 2 DAYS 09/14/20   Tanda Rockers, MD  tamsulosin (FLOMAX) 0.4 MG CAPS capsule TAKE 1 CAPSULE BY MOUTH DAILY. 09/18/20   Flinchum, Kelby Aline, FNP  traZODone (DESYREL) 100 MG tablet Take 100 mg by mouth at bedtime. 09/02/20   [provider]    Allergies    Asenapine maleate, Latuda [lurasidone hcl], Lurasidone, Naproxen, and Saphris [asenapine]  Review of Systems   Review of Systems   Constitutional: Negative for fever.  Respiratory: Negative for shortness of breath.   Cardiovascular: Negative for chest pain.  Gastrointestinal: Negative for nausea and vomiting.  Musculoskeletal: Positive for back pain and neck pain.  Skin: Negative for wound.  Allergic/Immunologic: Negative for immunocompromised state.  Neurological: Positive for headaches.  Hematological: Bruises/bleeds easily.  Psychiatric/Behavioral: Positive for self-injury and suicidal ideas. Negative for confusion.  All other systems reviewed and are negative.   Physical Exam Updated Vital Signs BP 113/79   Pulse (!) 54   Temp 98.1 F (36.7 C) (Oral)   Resp 18   Ht 6\' 3"  (1.905 m)   Wt 104.3 kg   SpO2  96%   BMI 28.75 kg/m   Physical Exam Vitals and nursing note reviewed.  Constitutional:      General: He is not in acute distress.    Appearance: He is well-developed and well-nourished. He is not diaphoretic.  HENT:     Head: Normocephalic and atraumatic.     Mouth/Throat:     Mouth: Mucous membranes are moist.  Eyes:     Extraocular Movements: Extraocular movements intact.     Pupils: Pupils are equal, round, and reactive to light.  Cardiovascular:     Rate and Rhythm: Normal rate and regular rhythm.     Pulses: Normal pulses.     Heart sounds: Normal heart sounds.  Pulmonary:     Effort: Pulmonary effort is normal.     Breath sounds: Normal breath sounds.  Abdominal:     Palpations: Abdomen is soft.     Tenderness: There is no abdominal tenderness.  Musculoskeletal:        General: Tenderness present. No swelling or deformity.     Cervical back: Tenderness present. No bony tenderness.     Thoracic back: No bony tenderness.     Lumbar back: No bony tenderness.       Back:     Right lower leg: No edema.     Left lower leg: No edema.     Comments: Right and left para spinous tenderness, no midline/bony tenderness.   Skin:    General: Skin is warm and dry.     Findings: No erythema  or rash.  Neurological:     Mental Status: He is alert and oriented to person, place, and time.     Sensory: Sensation is intact.     Motor: No weakness.  Psychiatric:        Mood and Affect: Mood and affect normal.        Behavior: Behavior normal.     ED Results / Procedures / Treatments   Labs (all labs ordered are listed, but only abnormal results are displayed) Labs Reviewed  COMPREHENSIVE METABOLIC PANEL - Abnormal; Notable for the following components:      Result Value   Glucose, Bld 146 (*)    Total Bilirubin 1.4 (*)    All other components within normal limits  SALICYLATE LEVEL - Abnormal; Notable for the following components:   Salicylate Lvl <8.1 (*)    All other components within normal limits  ACETAMINOPHEN LEVEL - Abnormal; Notable for the following components:   Acetaminophen (Tylenol), Serum <10 (*)    All other components within normal limits  ETHANOL  CBC  RAPID URINE DRUG SCREEN, HOSP PERFORMED    EKG None  Radiology DG Lumbar Spine Complete  Result Date: 09/24/2020 CLINICAL DATA:  Suicide attempt and subsequent trauma. EXAM: LUMBAR SPINE - COMPLETE 4+ VIEW COMPARISON:  Dec 20, 2019 FINDINGS: There is no evidence of an acute lumbar spine fracture. A chronic deformity is seen along the anterior aspect of the to the superior endplate of the L3 vertebral body. Bilateral radiopaque pedicle screws are seen at the levels of L4, L5 and S1. Postoperative changes are also seen within the intervertebral disc spaces at the levels of L4-L5 and L5-S1. Alignment is normal. Moderate severity endplate sclerosis and mild to moderate severity intervertebral disc space narrowing is seen throughout the remainder of the lumbar spine. IMPRESSION: 1. Extensive postoperative changes, as described above, without evidence of acute hardware complication. 2. Moderate severity multilevel degenerative changes throughout the remainder of the  lumbar spine, without an acute osseous  abnormality. Electronically Signed   By: Virgina Norfolk M.D.   On: 09/24/2020 16:10   CT Head Wo Contrast  Result Date: 09/24/2020 CLINICAL DATA:  [place] from top bunk landing on head, head and neck pain, coagulopathy, head trauma EXAM: CT HEAD WITHOUT CONTRAST CT CERVICAL SPINE WITHOUT CONTRAST TECHNIQUE: Multidetector CT imaging of the head and cervical spine was performed following the standard protocol without intravenous contrast. Multiplanar CT image reconstructions of the cervical spine were also generated. COMPARISON:  CT head and cervical spine 11/23/2018 FINDINGS: CT HEAD FINDINGS Brain: Normal ventricular morphology. No midline shift or mass effect. Normal appearance of brain parenchyma. No intracranial hemorrhage, mass lesion, or evidence of acute infarction. No extra-axial fluid collections. Vascular: No hyperdense vessels Skull: LEFT vertex scalp hematoma.  Calvaria intact. Sinuses/Orbits: Clear Other: N/A CT CERVICAL SPINE FINDINGS Alignment: 356 normal Skull base and vertebrae: Osseous mineralization low normal. Vertebral body heights maintained. Disc space narrowing C5-C6 and C6-C7. Scattered multilevel facet degenerative changes. Uncovertebral spurs encroach upon cervical neural foramina bilaterally at C6-C7 and prominently at RIGHT C5-C6. No fracture, subluxation, or bone destruction. Skull base appears intact. Soft tissues and spinal canal: Prevertebral soft tissues normal thickness. Visualized cervical soft tissues unremarkable. Disc levels:  No specific abnormality Upper chest: Biapical scarring. Emphysematous changes at RIGHT apex. Other: N/A IMPRESSION: LEFT vertex scalp hematoma. No acute intracranial abnormalities. Degenerative disc and facet disease changes of the cervical spine. No acute cervical spine abnormalities. Electronically Signed   By: Lavonia Dana M.D.   On: 09/24/2020 16:07   CT Cervical Spine Wo Contrast  Result Date: 09/24/2020 CLINICAL DATA:  [place] from top bunk landing  on head, head and neck pain, coagulopathy, head trauma EXAM: CT HEAD WITHOUT CONTRAST CT CERVICAL SPINE WITHOUT CONTRAST TECHNIQUE: Multidetector CT imaging of the head and cervical spine was performed following the standard protocol without intravenous contrast. Multiplanar CT image reconstructions of the cervical spine were also generated. COMPARISON:  CT head and cervical spine 11/23/2018 FINDINGS: CT HEAD FINDINGS Brain: Normal ventricular morphology. No midline shift or mass effect. Normal appearance of brain parenchyma. No intracranial hemorrhage, mass lesion, or evidence of acute infarction. No extra-axial fluid collections. Vascular: No hyperdense vessels Skull: LEFT vertex scalp hematoma.  Calvaria intact. Sinuses/Orbits: Clear Other: N/A CT CERVICAL SPINE FINDINGS Alignment: 356 normal Skull base and vertebrae: Osseous mineralization low normal. Vertebral body heights maintained. Disc space narrowing C5-C6 and C6-C7. Scattered multilevel facet degenerative changes. Uncovertebral spurs encroach upon cervical neural foramina bilaterally at C6-C7 and prominently at RIGHT C5-C6. No fracture, subluxation, or bone destruction. Skull base appears intact. Soft tissues and spinal canal: Prevertebral soft tissues normal thickness. Visualized cervical soft tissues unremarkable. Disc levels:  No specific abnormality Upper chest: Biapical scarring. Emphysematous changes at RIGHT apex. Other: N/A IMPRESSION: LEFT vertex scalp hematoma. No acute intracranial abnormalities. Degenerative disc and facet disease changes of the cervical spine. No acute cervical spine abnormalities. Electronically Signed   By: Lavonia Dana M.D.   On: 09/24/2020 16:07    Procedures Procedures   Medications Ordered in ED Medications - No data to display  ED Course  I have reviewed the triage vital signs and the nursing notes.  Pertinent labs & imaging results that were available during my care of the patient were reviewed by me and  considered in my medical decision making (see chart for details).  Clinical Course as of 09/24/20 1755  Thu Sep 24, 2020  1641 60  year old male presents for medical clearance after intentional fall/jump from top bunk bed landing on his head at jail today. Patient is on Plavix. On exam, no obvious injuries, reports neck and back pain, is found to have cervical para spinous tenderness, no midline/bony tenderness.   CT head, c-spine. XR L-spine reviewed. Scalp hematoma otherwise no acute injury.  Plan is for dc home for jail to manage psych concerns. Recommend Tylenol as needed and cold compress for scalp hematoma.  [LM]    Clinical Course User Index [LM] Roque Lias   MDM Rules/Calculators/A&P                          Final Clinical Impression(s) / ED Diagnoses Final diagnoses:  Suicidal ideation  Hematoma of scalp, initial encounter    Rx / DC Orders ED Discharge Orders    None       Tacy Learn, PA-C 09/24/20 1755    Hayden Rasmussen, MD 09/24/20 2041

## 2020-09-24 NOTE — ED Notes (Signed)
Discharge instructions reviewed with patient and deputy. Patient discharged in custody of [employer].

## 2020-09-24 NOTE — Telephone Encounter (Signed)
Thank you - he refused below referral per referral notes ok to place new referral if he is requesting.      Date User Summary Attachment  General 02/20/2020  1:06 PM Jake Samples - -  Note   Pt cancelled appointment on 7/29. Called to r/s appointment LVM for pt to call and r/s..7/29///tg          . Type Date User Summary Attachment  General 01/03/2020 11:51 AM Long, Patrice [place] - -  Note   LVM to schedule pulmonary consult, was going to see if pt wants to go to our [place] location since he lives in [place]         . Type Date User Summary Attachment  General 12/25/2019  3:59 PM Long, Brandi M - -  Note   LVM to schedule pulmonary consult, was going to see if pt wants to go to our [place] location since he lives in [place]         . Type Date User Summary Attachment  Provider Comments 12/25/2019  3:30 PM Marji Kuehnel, Kelby Aline, FNP Provider Comments -  Note   COPD history  Smoker    Reason for Referral:   Has the referral been discussed with the patient?:    Designated contact for the referral if not the patient (name/phone number): on file    Has the patient seen a specialist for this issue before?:he denies   If so, who (practice/provider)?   Does the patient have a provider or location preference for the referral?:[place]  Would the patient like to see previous specialist if applicable? None seen          Referral Order  Order  Ambulatory referral to Pulmonology (Order # [id number]) on 12/25/2019  View Encounter

## 2020-09-24 NOTE — Discharge Instructions (Addendum)
Recommend observation/safe unit due to SI, attempt to self harm today. Can apply ice for 20 minutes at a time to scalp hematoma. Tylenol for headache as needed.

## 2020-09-24 NOTE — Telephone Encounter (Signed)
Keith Gibbs, just trying to help out get things caught up.  I can't find that a referral has actually been ordered.

## 2020-09-25 ENCOUNTER — Telehealth: Payer: Self-pay

## 2020-09-25 NOTE — Addendum Note (Signed)
Addended by: Minette Headland on: 09/25/2020 04:38 PM   Modules accepted: Orders

## 2020-09-25 NOTE — Telephone Encounter (Signed)
Transition Care Management Unsuccessful Follow-up Telephone Call  Date of discharge and from where:  09/24/2020 from [facility]  Attempts:  1st Attempt  Reason for unsuccessful TCM follow-up call:  Unable to reach patient   Notes from Hospital stated that pt was brought in from jail. Disposition for discharged did not state specifically release back to jail for to home for monitoring.   Closing Note.

## 2020-09-28 ENCOUNTER — Other Ambulatory Visit (HOSPITAL_COMMUNITY): Payer: Medicaid Other

## 2020-09-28 ENCOUNTER — Other Ambulatory Visit (HOSPITAL_COMMUNITY)
Admission: RE | Admit: 2020-09-28 | Discharge: 2020-09-28 | Disposition: A | Discharge status: Home / Self Care | Payer: Medicaid Other | Source: Ambulatory Visit | Attending: Internal Medicine | Admitting: Internal Medicine

## 2020-09-29 ENCOUNTER — Encounter (HOSPITAL_COMMUNITY): Payer: Medicaid Other

## 2020-09-29 ENCOUNTER — Ambulatory Visit (HOSPITAL_COMMUNITY): Admission: RE | Admit: 2020-09-29 | Payer: Medicaid Other | Source: Ambulatory Visit

## 2020-10-26 ENCOUNTER — Other Ambulatory Visit: Payer: Self-pay

## 2020-10-26 ENCOUNTER — Emergency Department (HOSPITAL_COMMUNITY)
Admission: EM | Admit: 2020-10-26 | Discharge: 2020-10-26 | Disposition: A | Discharge status: Home / Self Care | Payer: Medicaid Other | Attending: Emergency Medicine | Admitting: Emergency Medicine

## 2020-10-26 ENCOUNTER — Encounter (HOSPITAL_COMMUNITY): Payer: Self-pay | Admitting: Emergency Medicine

## 2020-10-26 DIAGNOSIS — Z7902 Long term (current) use of antithrombotics/antiplatelets: Secondary | ICD-10-CM | POA: Diagnosis not present

## 2020-10-26 DIAGNOSIS — I10 Essential (primary) hypertension: Secondary | ICD-10-CM | POA: Insufficient documentation

## 2020-10-26 DIAGNOSIS — K4090 Unilateral inguinal hernia, without obstruction or gangrene, not specified as recurrent: Secondary | ICD-10-CM

## 2020-10-26 DIAGNOSIS — Z7982 Long term (current) use of aspirin: Secondary | ICD-10-CM | POA: Insufficient documentation

## 2020-10-26 DIAGNOSIS — J449 Chronic obstructive pulmonary disease, unspecified: Secondary | ICD-10-CM | POA: Diagnosis not present

## 2020-10-26 DIAGNOSIS — Z7951 Long term (current) use of inhaled steroids: Secondary | ICD-10-CM | POA: Insufficient documentation

## 2020-10-26 DIAGNOSIS — I25118 Atherosclerotic heart disease of native coronary artery with other forms of angina pectoris: Secondary | ICD-10-CM | POA: Insufficient documentation

## 2020-10-26 DIAGNOSIS — Z87891 Personal history of nicotine dependence: Secondary | ICD-10-CM | POA: Diagnosis not present

## 2020-10-26 DIAGNOSIS — M25561 Pain in right knee: Secondary | ICD-10-CM | POA: Diagnosis present

## 2020-10-26 DIAGNOSIS — M25461 Effusion, right knee: Secondary | ICD-10-CM | POA: Diagnosis not present

## 2020-10-26 MED ORDER — OXYCODONE-ACETAMINOPHEN 5-325 MG PO TABS
1.0000 | ORAL_TABLET | Freq: Once | ORAL | Status: AC
  Administered 2020-10-26: 1 via ORAL
  Filled 2020-10-26: qty 1

## 2020-10-26 MED ORDER — OXYCODONE-ACETAMINOPHEN 5-325 MG PO TABS: 1.0000 | ORAL_TABLET | ORAL | 0 refills | Status: DC | PRN

## 2020-10-26 MED ORDER — LIDOCAINE-EPINEPHRINE (PF) 2 %-1:200000 IJ SOLN
INTRAMUSCULAR | Status: AC
  Filled 2020-10-26: qty 20

## 2020-10-26 MED ORDER — LIDOCAINE-EPINEPHRINE (PF) 2 %-1:200000 IJ SOLN
20.0000 mL | Freq: Once | INTRAMUSCULAR | Status: AC
  Administered 2020-10-26: 20 mL

## 2020-10-26 NOTE — ED Provider Notes (Addendum)
[facility] DEPT Provider Note   CSN: [id number] Arrival date & time: 10/26/20  1117     History Chief Complaint  Patient presents with  . Knee Pain    Keith Gibbs is a 60 y.o. male.  60 year old male presents with right knee pain that began several days ago after lifting a heavy object.  Also noted some right groin swelling to which is since resolved.  Has a history of osteoarthritis of that knee.  Denies any fever or chills.  Has had the knee drained before by his orthopedist who was unavailable at this time.  Pain is dull and worse with ambulation.  No treatment use prior to arrival.        Past Medical History:  Diagnosis Date  . Anxiety   . Arthritis   . Bursitis   . Chronic pain   . COPD (chronic obstructive pulmonary disease) ([place])   . Depression   . GERD (gastroesophageal reflux disease)   . Hepatitis C 2012   No longer has Hep C  . Hypertension   . Lung disease   . MI (myocardial infarction) ([place])   . Stroke ([place])   . Substance abuse [place])     Patient Active Problem List   Diagnosis Date Noted  . Cigarette smoker 07/21/2020  . Foot pain, bilateral 12/25/2019  . Potential exposure to STD 12/12/2019  . History of lumbar surgery 12/12/2019  . Fatigue 12/12/2019  . Penile lesion 12/12/2019  . Right testicular pain 12/12/2019  . Body mass index 28.0-28.9, adult 12/12/2019  . History of COPD 12/12/2019  . Tobacco use disorder, continuous 12/12/2019  . Hepatoma ([place]) 11/05/2019  . Testicular discomfort 07/12/2019  . Anxiety 12/10/2018  . Unstable angina ([place]) 11/30/2018  . Essential hypertension 09/18/2018  . Heart valve disorder 09/18/2018  . Hypercholesteremia 09/18/2018  . 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 Chronic anticoagulation (Plavix) 04/19/2018  . Pain medication agreement broken [facility]) 04/19/2018  . Cocaine use 04/18/2018  . Cocaine abuse ([place]) (See 04/12/18 UDS) 04/18/2018  . Marijuana abuse  (See 04/12/18 UDS) 04/18/2018  . Long term current use of opiate analgesic 04/12/2018  . Cirrhosis of liver ([place]) 04/03/2018  . BPH (benign prostatic hyperplasia) 04/03/2018  . Hematuria, microscopic 04/03/2018  . Osteoarthritis of the knee (Left) 11/02/2017  . Lumbar facet syndrome (Bilateral) 11/01/2017  . Spondylosis without myelopathy or radiculopathy, lumbosacral region 11/01/2017  . Osteoarthritis of shoulder (Right) 11/01/2017  . Osteoarthritis of acromioclavicular joint (Right) 11/01/2017  . 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 Osteoarthritis 11/01/2017  . Cervicalgia 11/01/2017  . Cervical facet syndrome 11/01/2017  . DDD (degenerative disc disease), cervical 11/01/2017  . DDD (degenerative disc disease), lumbar 11/01/2017  . 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) 09/18/2017  . Other chronic pain 09/18/2017  . Chronic musculoskeletal pain 09/18/2017  . Chronic pain of right knee 09/11/2017  . Chronic low back pain (Secondary Area of Pain) (Bilateral) 09/11/2017  . Chronic hip pain [facility] Area of Pain) (Bilateral) (R>L) 09/11/2017  . Chronic shoulder pain (Fourth Area of Pain) (Right) 09/11/2017  . Spondylosis without myelopathy or radiculopathy, cervical region 09/11/2017  . Chronic pain syndrome 09/11/2017  . Opiate use 09/11/2017  . Screen for STD (sexually transmitted disease) 09/11/2017  . Disorder of skeletal system 09/11/2017  . Problems influencing health status 09/11/2017  . Overdose of medication, intentional self-harm, sequela ([place]) 12/28/2016  . Tobacco use disorder 12/28/2016  . Severe recurrent major depression  without psychotic features ([place]) 12/27/2016  . Coronary artery disease of native artery of native heart with stable angina pectoris ([place]) 12/27/2016  . Alcohol use disorder, moderate, dependence ([place]) 12/27/2016  . Cannabis use disorder, moderate, dependence ([place]) 12/27/2016  . Angina at rest [facility]) 06/11/2016  . Chest  pain 06/10/2016  . Bipolar 1 disorder ([place]) 05/27/2013  . Abnormal ejaculation 11/30/2012  . Chest pain, unspecified 11/30/2012  . Degenerative arthritis of hip 11/16/2012  . Schizoaffective disorder ([place]) 06/27/2012  . COPD GOLD ? / still smoking  06/27/2012  . Chronic hepatitis C ([place]) 06/27/2012  . GERD (gastroesophageal reflux disease) 06/27/2012  . Complete rupture of rotator cuff 02/10/2012  . Cocaine abuse in remission ([place]) 12/05/2011  . Unspecified osteoarthritis, unspecified site 07/27/2011    Past Surgical History:  Procedure Laterality Date  . ABDOMINAL SURGERY     removed small piece of intestines due to [facility] Diverticulosis  . APPENDECTOMY    . BACK SURGERY    . CARDIAC CATHETERIZATION Left 06/10/2016   Procedure: Left Heart Cath and Coronary Angiography;  Surgeon: Dionisio David, MD;  Location: [place] CV LAB;  Service: Cardiovascular;  Laterality: Left;  . CARDIAC CATHETERIZATION N/A 06/10/2016   Procedure: Coronary Stent Intervention;  Surgeon: Yolonda Kida, MD;  Location: [place] CV LAB;  Service: Cardiovascular;  Laterality: N/A;  . COLONOSCOPY    . COLONOSCOPY WITH PROPOFOL N/A 01/05/2017   Procedure: COLONOSCOPY WITH PROPOFOL;  Surgeon: Jonathon Bellows, MD;  Location: [facility] ENDOSCOPY;  Service: Endoscopy;  Laterality: N/A;  . COLONOSCOPY WITH PROPOFOL N/A 02/13/2020   Procedure: COLONOSCOPY WITH PROPOFOL;  Surgeon: Virgel Manifold, MD;  Location: [facility] ENDOSCOPY;  Service: Endoscopy;  Laterality: N/A;  . CORONARY ANGIOPLASTY WITH STENT PLACEMENT    . ESOPHAGOGASTRODUODENOSCOPY (EGD) WITH PROPOFOL N/A 01/05/2017   Procedure: ESOPHAGOGASTRODUODENOSCOPY (EGD) WITH PROPOFOL;  Surgeon: Jonathon Bellows, MD;  Location: [facility] ENDOSCOPY;  Service: Endoscopy;  Laterality: N/A;  . ESOPHAGOGASTRODUODENOSCOPY (EGD) WITH PROPOFOL N/A 02/13/2020   Procedure: ESOPHAGOGASTRODUODENOSCOPY (EGD) WITH PROPOFOL;  Surgeon: Virgel Manifold, MD;  Location: [facility] ENDOSCOPY;   Service: Endoscopy;  Laterality: N/A;  . INTRAVASCULAR PRESSURE WIRE/FFR STUDY N/A 12/04/2019   Procedure: INTRAVASCULAR PRESSURE WIRE/FFR STUDY;  Surgeon: Sherren Mocha, MD;  Location: [place] CV LAB;  Service: Cardiovascular;  Laterality: N/A;  . KNEE ARTHROSCOPY WITH MEDIAL MENISECTOMY Right 09/05/2017   Procedure: KNEE ARTHROSCOPY WITH MEDIAL AND LATERAL  MENISECTOMY PARTIAL SYNOVECTOMY;  Surgeon: Hessie Knows, MD;  Location: [facility] ORS;  Service: Orthopedics;  Laterality: Right;  . LEFT HEART CATH AND CORONARY ANGIOGRAPHY N/A 12/04/2019   Procedure: LEFT HEART CATH AND CORONARY ANGIOGRAPHY;  Surgeon: Sherren Mocha, MD;  Location: [place] CV LAB;  Service: Cardiovascular;  Laterality: N/A;  . SHOULDER SURGERY Right 04/09/2012  . SPINE SURGERY         Family History  Problem Relation Age of Onset  . Osteoarthritis Mother   . Heart disease Mother   . Hypertension Mother   . Depression Mother   . Heart disease Father   . Early death Father   . Hypertension Father   . Heart attack Father   . Hypertension Sister   . Prostate cancer Neg Hx   . Bladder Cancer Neg Hx   . Kidney cancer Neg Hx     Social History   Tobacco Use  . Smoking status: Former Smoker    Packs/day: 1.00    Years: 32.00    Pack years: 32.00  Types: Cigarettes  . Smokeless tobacco: Never Used  . Tobacco comment: Quit smoking 08/10/2020  Vaping Use  . Vaping Use: Never used  Substance Use Topics  . Alcohol use: Yes    Alcohol/week: 6.0 standard drinks    Types: 6 Cans of beer per week    Comment: occassionally   . Drug use: Yes    Types: Cocaine, Marijuana    Comment: last on saturday    Home Medications Prior to Admission medications   Medication Sig Start Date End Date Taking? Authorizing Provider  albuterol (VENTOLIN HFA) 108 (90 Base) MCG/ACT inhaler Inhale 2 puffs into the lungs every 6 (six) hours as needed for wheezing or shortness of breath. 05/28/19   Charlott Rakes, MD  aspirin  EC 81 MG tablet Take 81 mg by mouth daily.    [provider]  atorvastatin (LIPITOR) 40 MG tablet Take 1 tablet (40 mg total) by mouth daily at 6 PM. Patient taking differently: Take 40 mg by mouth at bedtime. 12/30/16   Pucilowska, Jolanta B, MD  atorvastatin (LIPITOR) 80 MG tablet Take 80 mg by mouth daily. 08/28/20   [provider]  Budeson-Glycopyrrol-Formoterol (BREZTRI AEROSPHERE) 160-9-4.8 MCG/ACT AERO Inhale 2 puffs into the lungs 2 (two) times daily. 08/06/20   Tanda Rockers, MD  cetirizine (ZYRTEC) 10 MG tablet TAKE 1 TABLETS DAILY. 09/18/20   Flinchum, Kelby Aline, FNP  citalopram (CELEXA) 20 MG tablet Take 20 mg by mouth daily. 09/02/20   [provider]  clopidogrel (PLAVIX) 75 MG tablet Take 75 mg by mouth daily.    [provider]  cyclobenzaprine (FLEXERIL) 10 MG tablet TAKE (1) TABLET BY MOUTH TWICE DAILY AS NEEDED FOR SPASMS. 09/18/20   Flinchum, Kelby Aline, FNP  DULoxetine (CYMBALTA) 60 MG capsule TAKE ONE CAPSULE BY MOUTH ONCE DAILY. 09/18/20   Flinchum, Kelby Aline, FNP  isosorbide mononitrate (IMDUR) 60 MG 24 hr tablet Take 1 tablet (60 mg total) by mouth daily. Patient not taking: Reported on 09/08/2020 12/04/19   Sherren Mocha, MD  nitroGLYCERIN (NITROSTAT) 0.4 MG SL tablet Place 1 tablet (0.4 mg total) under the tongue every 5 (five) minutes x 3 doses as needed for chest pain. Patient not taking: Reported on 09/08/2020 12/10/18   Charlott Rakes, MD  omeprazole (PRILOSEC) 40 MG capsule TAKE ONE CAPSULE BY MOUTH ONCE DAILY. 09/18/20   Flinchum, Kelby Aline, FNP  predniSONE (DELTASONE) 10 MG tablet TAKE 4 TABS BY MOUTH EVERY MORNING FOR 2 DAYS; 2 TABS FOR 2 DAYS; THEN 1 TAB FOR 2 DAYS 09/14/20   Tanda Rockers, MD  tamsulosin (FLOMAX) 0.4 MG CAPS capsule TAKE 1 CAPSULE BY MOUTH DAILY. 09/18/20   Flinchum, Kelby Aline, FNP  traZODone (DESYREL) 100 MG tablet Take 100 mg by mouth at bedtime. 09/02/20   [provider]    Allergies    Asenapine  maleate, Latuda [lurasidone hcl], Lurasidone, Naproxen, and Saphris [asenapine]  Review of Systems   Review of Systems  All other systems reviewed and are negative.   Physical Exam Updated Vital Signs BP (!) 136/94 (BP Location: Right Arm)   Pulse 75   Temp 97.7 F (36.5 C) (Oral)   Resp 16   Ht 1.905 m (6\' 3" )   Wt 104.3 kg   SpO2 99%   BMI 28.75 kg/m   Physical Exam Vitals and nursing note reviewed.  Constitutional:      General: He is not in acute distress.    Appearance: Normal appearance. He is  well-developed. He is not toxic-appearing.  HENT:     Head: Normocephalic and atraumatic.  Eyes:     General: Lids are normal.     Conjunctiva/sclera: Conjunctivae normal.     Pupils: Pupils are equal, round, and reactive to light.  Neck:     Thyroid: No thyroid mass.     Trachea: No tracheal deviation.  Cardiovascular:     Rate and Rhythm: Normal rate and regular rhythm.     Heart sounds: Normal heart sounds. No murmur heard. No gallop.   Pulmonary:     Effort: Pulmonary effort is normal. No respiratory distress.     Breath sounds: Normal breath sounds. No stridor. No decreased breath sounds, wheezing, rhonchi or rales.  Abdominal:     General: Bowel sounds are normal. There is no distension.     Palpations: Abdomen is soft.     Tenderness: There is no abdominal tenderness. There is no rebound.  Genitourinary:      Comments: No evidence of incarceration. Musculoskeletal:        General: No tenderness.     Cervical back: Normal range of motion and neck supple.     Right knee: Effusion present. No erythema. Decreased range of motion.  Skin:    General: Skin is warm and dry.     Findings: No abrasion or rash.  Neurological:     Mental Status: He is alert and oriented to person, place, and time.     GCS: GCS eye subscore is 4. GCS verbal subscore is 5. GCS motor subscore is 6.     Cranial Nerves: No cranial nerve deficit.     Sensory: No sensory deficit.   Psychiatric:        Speech: Speech normal.        Behavior: Behavior normal.     ED Results / Procedures / Treatments   Labs (all labs ordered are listed, but only abnormal results are displayed) Labs Reviewed - No data to display  EKG None  Radiology No results found.  Procedures .Joint Aspiration/Arthrocentesis  Date/Time: 10/26/2020 11:56 AM Performed by: Lacretia Leigh, MD Authorized by: Lacretia Leigh, MD   Consent:    Consent obtained:  Verbal   Consent given by:  Patient   Risks, benefits, and alternatives were discussed: yes     Risks discussed:  Infection   Alternatives discussed:  No treatment Universal protocol:    Immediately prior to procedure, a time out was called: yes     Patient identity confirmed:  Verbally with patient Location:    Location:  Knee   Knee:  R knee Anesthesia:    Anesthesia method:  Local infiltration   Local anesthetic:  Lidocaine 1% WITH epi Procedure details:    Preparation: Patient was prepped and draped in usual sterile fashion     Needle gauge:  18 G   Ultrasound guidance: no     Approach:  Superior   Aspirate amount:  85   Aspirate characteristics:  Yellow   Steroid injected: no     Specimen collected: no   Post-procedure details:    Dressing:  Adhesive bandage   Procedure completion:  Tolerated well, no immediate complications     Medications Ordered in ED Medications  lidocaine-EPINEPHrine (XYLOCAINE W/EPI) 2 %-1:200000 (PF) injection (  Canceled Entry 10/26/20 1152)  oxyCODONE-acetaminophen (PERCOCET/ROXICET) 5-325 MG per tablet 1 tablet (1 tablet Oral Given 10/26/20 1148)  lidocaine-EPINEPHrine (XYLOCAINE W/EPI) 2 %-1:200000 (PF) injection 20 mL (20 mLs Other Given  by Other 10/26/20 1151)    ED Course  I have reviewed the triage vital signs and the nursing notes.  Pertinent labs & imaging results that were available during my care of the patient were reviewed by me and considered in my medical decision making (see  chart for details).    MDM Rules/Calculators/A&P                          Patient given Percocet here for pain.  Will refer to general surgery for patient's likely right inguinal hernia.  Patient able to ambulate much better after arthrocentesis.  Fluid bloody no obvious signs of infection.  Very low suspicion for septic arthritis.  Likely recurrent effusion secondary to her known osteoarthritis.  Will place on anti-inflammatories Final Clinical Impression(s) / ED Diagnoses Final diagnoses:  None    Rx / DC Orders ED Discharge Orders    None       Lacretia Leigh, MD 10/26/20 1157    Lacretia Leigh, MD 10/26/20 1158

## 2020-10-26 NOTE — ED Triage Notes (Signed)
Patient reports knee swelling/pain and groin pain on the right side. He also reports right testicle swelling. He states four days ago, he was picking up a heavy nightstand and felt a pulling sensation in his groin area. Reports right knee surgery approximately 1 year ago and states he will need a knee replacement in the future.   Reports taking tylenol w/ no relief.

## 2020-10-27 ENCOUNTER — Telehealth: Payer: Self-pay | Admitting: Internal Medicine

## 2020-10-27 NOTE — Telephone Encounter (Signed)
PA request was received from (pharmacy): [facility] Fax: [phone] Medication name and strength: Breztri Ordering Provider: Dr Melvyn Novas  Was PA started with [facility]?: Yes If yes, please enter KEY: [id number] Medication tried and failed: Dulera 222mcg & Advair 250 Covered Alternatives:   PA sent to plan, time frame for approval / denial: 1-3 business days Routing to [place] for follow-up

## 2020-11-02 ENCOUNTER — Other Ambulatory Visit (HOSPITAL_COMMUNITY): Payer: Self-pay | Admitting: Orthopedic Surgery

## 2020-11-02 ENCOUNTER — Other Ambulatory Visit: Payer: Self-pay | Admitting: Orthopedic Surgery

## 2020-11-02 DIAGNOSIS — M25461 Effusion, right knee: Secondary | ICD-10-CM

## 2020-11-02 DIAGNOSIS — M1711 Unilateral primary osteoarthritis, right knee: Secondary | ICD-10-CM

## 2020-11-03 NOTE — Telephone Encounter (Signed)
Checked on the status of the PA today.  Still pending.  Will route back to leslie to follow up on.

## 2020-11-04 ENCOUNTER — Ambulatory Visit
Admission: RE | Admit: 2020-11-04 | Discharge: 2020-11-04 | Disposition: A | Discharge status: Home / Self Care | Payer: Medicaid Other | Source: Ambulatory Visit | Attending: Orthopedic Surgery | Admitting: Orthopedic Surgery

## 2020-11-04 ENCOUNTER — Other Ambulatory Visit: Payer: Self-pay

## 2020-11-04 DIAGNOSIS — M1711 Unilateral primary osteoarthritis, right knee: Secondary | ICD-10-CM | POA: Insufficient documentation

## 2020-11-04 DIAGNOSIS — M25461 Effusion, right knee: Secondary | ICD-10-CM | POA: Insufficient documentation

## 2020-11-24 ENCOUNTER — Other Ambulatory Visit: Payer: Self-pay | Admitting: Family Medicine

## 2020-11-24 DIAGNOSIS — F32A Depression, unspecified: Secondary | ICD-10-CM

## 2020-11-24 DIAGNOSIS — M5136 Other intervertebral disc degeneration, lumbar region: Secondary | ICD-10-CM

## 2020-11-24 DIAGNOSIS — K219 Gastro-esophageal reflux disease without esophagitis: Secondary | ICD-10-CM

## 2020-11-24 MED ORDER — OMEPRAZOLE 40 MG PO CPDR: 1.0000 | DELAYED_RELEASE_CAPSULE | Freq: Every day | ORAL | 0 refills | Status: DC

## 2020-11-24 MED ORDER — TAMSULOSIN HCL 0.4 MG PO CAPS: 0.4000 mg | ORAL_CAPSULE | Freq: Every day | ORAL | 0 refills | Status: DC

## 2020-11-24 MED ORDER — CETIRIZINE HCL 10 MG PO TABS: ORAL_TABLET | ORAL | 0 refills | Status: DC

## 2020-11-24 MED ORDER — DULOXETINE HCL 60 MG PO CPEP: 60.0000 mg | ORAL_CAPSULE | Freq: Every day | ORAL | 0 refills | Status: DC

## 2020-11-24 NOTE — Telephone Encounter (Signed)
   Notes to clinic: Patient has appt on 01/29/2021 Review for refills Medication was last filled on 09/18/2020 for 30 day    Requested Prescriptions  Pending Prescriptions Disp Refills   tamsulosin (FLOMAX) 0.4 MG CAPS capsule 30 capsule 0    Sig: Take 1 capsule (0.4 mg total) by mouth daily.      There is no refill protocol information for this order      omeprazole (PRILOSEC) 40 MG capsule 30 capsule 0    Sig: Take 1 capsule (40 mg total) by mouth daily.      There is no refill protocol information for this order      DULoxetine (CYMBALTA) 60 MG capsule 30 capsule 0    Sig: Take 1 capsule (60 mg total) by mouth daily.      There is no refill protocol information for this order      cetirizine (ZYRTEC) 10 MG tablet 30 tablet 0      There is no refill protocol information for this order

## 2020-11-24 NOTE — Telephone Encounter (Signed)
Copied from [place] [id number]. Topic: Quick Communication - Rx Refill/Question >> Nov 24, 2020  1:29 PM Leward Quan A wrote: Medication: tamsulosin (FLOMAX) 0.4 MG CAPS capsule, cetirizine (ZYRTEC) 10 MG tablet, omeprazole (PRILOSEC) 40 MG capsule, DULoxetine (CYMBALTA) 60 MG capsule   Has the patient contacted their pharmacy? Yes.   (Agent: If no, request that the patient contact the pharmacy for the refill.) (Agent: If yes, when and what did the pharmacy advise?)  Preferred Pharmacy (with phone number or street name): [id number] [address]  Phone:  [phone] Fax:  [phone]     Agent: Please be advised that RX refills may take up to 3 business days. We ask that you follow-up with your pharmacy.

## 2020-12-03 ENCOUNTER — Other Ambulatory Visit: Payer: Self-pay | Admitting: Family Medicine

## 2020-12-03 DIAGNOSIS — F419 Anxiety disorder, unspecified: Secondary | ICD-10-CM

## 2020-12-03 DIAGNOSIS — M5136 Other intervertebral disc degeneration, lumbar region: Secondary | ICD-10-CM

## 2020-12-03 DIAGNOSIS — F32A Depression, unspecified: Secondary | ICD-10-CM

## 2020-12-03 DIAGNOSIS — K219 Gastro-esophageal reflux disease without esophagitis: Secondary | ICD-10-CM

## 2020-12-03 NOTE — Telephone Encounter (Signed)
Requested Prescriptions  Pending Prescriptions Disp Refills  . DULoxetine (CYMBALTA) 60 MG capsule [Pharmacy Med Name: DULOXETINE HCL DR 60 MG CAP] 60 capsule 0    Sig: TAKE 1 CAPSULE BY MOUTH EVERY DAY     There is no refill protocol information for this order    . tamsulosin (FLOMAX) 0.4 MG CAPS capsule [Pharmacy Med Name: TAMSULOSIN HCL 0.4 MG CAPSULE] 30 capsule 0    Sig: TAKE 1 CAPSULE BY MOUTH EVERY DAY     There is no refill protocol information for this order    . cetirizine (ZYRTEC) 10 MG tablet [Pharmacy Med Name: CETIRIZINE HCL 10 MG TABLET] 30 tablet 0    Sig: TAKE 1 TABLET BY MOUTH EVERY DAY     There is no refill protocol information for this order    . omeprazole (PRILOSEC) 40 MG capsule [Pharmacy Med Name: OMEPRAZOLE DR 40 MG CAPSULE] 30 capsule 0    Sig: TAKE 1 CAPSULE (40 MG TOTAL) BY MOUTH DAILY.     There is no refill protocol information for this order

## 2020-12-08 ENCOUNTER — Ambulatory Visit: Payer: Self-pay | Admitting: Adult Health

## 2020-12-17 ENCOUNTER — Other Ambulatory Visit: Payer: Self-pay | Admitting: Orthopedic Surgery

## 2021-01-05 ENCOUNTER — Other Ambulatory Visit: Payer: Self-pay

## 2021-01-05 ENCOUNTER — Other Ambulatory Visit
Admission: RE | Admit: 2021-01-05 | Discharge: 2021-01-05 | Disposition: A | Discharge status: Home / Self Care | Payer: Medicaid Other | Source: Ambulatory Visit | Attending: Orthopedic Surgery | Admitting: Orthopedic Surgery

## 2021-01-05 DIAGNOSIS — Z01818 Encounter for other preprocedural examination: Secondary | ICD-10-CM | POA: Diagnosis present

## 2021-01-05 HISTORY — DX: Dyspnea, unspecified: R06.00

## 2021-01-05 HISTORY — DX: Diverticulitis of intestine, part unspecified, without perforation or abscess without bleeding: K57.92

## 2021-01-05 HISTORY — DX: Angina pectoris, unspecified: I20.9

## 2021-01-05 HISTORY — DX: Peptic ulcer, site unspecified, unspecified as acute or chronic, without hemorrhage or perforation: K27.9

## 2021-01-05 LAB — URINALYSIS, ROUTINE W REFLEX MICROSCOPIC
Bilirubin Urine: NEGATIVE
Glucose, UA: NEGATIVE mg/dL
Ketones, ur: NEGATIVE mg/dL
Leukocytes,Ua: NEGATIVE
Nitrite: NEGATIVE
Protein, ur: NEGATIVE mg/dL
Specific Gravity, Urine: 1.018 (ref 1.005–1.030)
Squamous Epithelial / HPF: NONE SEEN (ref 0–5)
WBC, UA: NONE SEEN WBC/hpf (ref 0–5)
pH: 5 (ref 5.0–8.0)

## 2021-01-05 LAB — COMPREHENSIVE METABOLIC PANEL
ALT: 18 U/L (ref 0–44)
AST: 22 U/L (ref 15–41)
Albumin: 3.8 g/dL (ref 3.5–5.0)
Alkaline Phosphatase: 63 U/L (ref 38–126)
Anion gap: 6 (ref 5–15)
BUN: 16 mg/dL (ref 6–20)
CO2: 27 mmol/L (ref 22–32)
Calcium: 8.9 mg/dL (ref 8.9–10.3)
Chloride: 104 mmol/L (ref 98–111)
Creatinine, Ser: 0.89 mg/dL (ref 0.61–1.24)
GFR, Estimated: >60 mL/min (ref >60)
Glucose, Bld: 96 mg/dL (ref 70–99)
Potassium: 4.7 mmol/L (ref 3.5–5.1)
Sodium: 137 mmol/L (ref 135–145)
Total Bilirubin: 0.6 mg/dL (ref 0.3–1.2)
Total Protein: 6.3 g/dL — ABNORMAL LOW (ref 6.5–8.1)

## 2021-01-05 LAB — CBC WITH DIFFERENTIAL/PLATELET
Abs Immature Granulocytes: 0.03 10*3/uL (ref 0.00–0.07)
Basophils Absolute: 0 10*3/uL (ref 0.0–0.1)
Basophils Relative: 0 %
Eosinophils Absolute: 0.3 10*3/uL (ref 0.0–0.5)
Eosinophils Relative: 4 %
HCT: 39.7 % (ref 39.0–52.0)
Hemoglobin: 13.8 g/dL (ref 13.0–17.0)
Immature Granulocytes: 0 %
Lymphocytes Relative: 30 %
Lymphs Abs: 2.1 10*3/uL (ref 0.7–4.0)
MCH: 32.5 pg (ref 26.0–34.0)
MCHC: 34.8 g/dL (ref 30.0–36.0)
MCV: 93.4 fL (ref 80.0–100.0)
Monocytes Absolute: 0.5 10*3/uL (ref 0.1–1.0)
Monocytes Relative: 7 %
Neutro Abs: 4 10*3/uL (ref 1.7–7.7)
Neutrophils Relative %: 59 %
Platelets: 181 10*3/uL (ref 150–400)
RBC: 4.25 MIL/uL (ref 4.22–5.81)
RDW: 13.2 % (ref 11.5–15.5)
WBC: 6.9 10*3/uL (ref 4.0–10.5)
nRBC: 0 % (ref 0.0–0.2)

## 2021-01-05 LAB — SURGICAL PCR SCREEN
MRSA, PCR: NEGATIVE
Staphylococcus aureus: NEGATIVE

## 2021-01-05 LAB — TYPE AND SCREEN
ABO/RH(D): O POS
Antibody Screen: NEGATIVE

## 2021-01-05 NOTE — Pre-Procedure Instructions (Signed)
Needs to call pt to give instructions about aspirin and plavix.

## 2021-01-05 NOTE — Patient Instructions (Addendum)
Your procedure is scheduled on:01/14/2021 Report to Day Surgery above [place]  on 2nd floor To find out your arrival time please call [phone] between [place] on  01/11/2021 to get surgery time  Remember: Instructions that are not followed completely may result in serious medical risk,  up to and including death, or upon the discretion of your surgeon and anesthesiologist your  surgery may need to be rescheduled.     _X__ 1. Do not eat food after midnight the night before your procedure.                  No chewing gum or hard candies. You may drink clear liquids up to 2 hours                 before you are scheduled to arrive for your surgery- DO not drink clear                 liquids within 2 hours of the start of your surgery.                 Clear Liquids include:  water only.  __X__2.  On the morning of surgery brush your teeth with toothpaste and water, you                may rinse your mouth with mouthwash if you wish.  Do not swallow any toothpaste of mouthwash.     _X__ 3.  No Alcohol for 24 hours before or after surgery.   _X__ 4.  Do Not Smoke or use e-cigarettes For 24 Hours Prior to Your Surgery.                 Do not use any chewable tobacco products for at least 6 hours prior to                 Surgery.  _X__  5.  Do not use any recreational drugs (marijuana, cocaine, heroin, ecstasy, MDMA or other)                For at least one week prior to your surgery.  Combination of these drugs with anesthesia                May have life threatening results.  ____  6.  Bring all medications with you on the day of surgery if instructed.   _X_  7.  Notify your doctor if there is any change in your medical condition      (cold, fever, infections).     Do not wear jewelry, make-up, hairpins, clips or nail polish. Do not wear lotions, powders, or perfumes. You may wear deodorant. Do not shave 48 hours prior to surgery. Men may shave face and  neck. Do not bring valuables to the hospital.    [facility] is not responsible for any belongings or valuables.  Contacts, dentures or bridgework may not be worn into surgery. Leave your suitcase in the car. After surgery it may be brought to your room. For patients admitted to the hospital, discharge time is determined by your treatment team.   Patients discharged the day of surgery will not be allowed to drive home.   Make arrangements for someone to be with you for the first 24 hours of your Same Day Discharge.    Please read over the following fact sheets that you were given:       __X__ Take these medicines  the morning of surgery with A SIP OF WATER:    1.  Atorvastatin   2.  celexa  3.  cymbalta  4   omeprazole  5.  Tramadol as needed  6.  isosorbide             7.  Tamsulosin             8.  Duloxetine              9. Bupropion             10. cetirizine     __X__ Use CHG Soap (or wipes) as directed    __X_ Use inhalers on the day of surgery       ___X_ Stop Coumadin/Plavix/aspirin on 7 days before surgery. Last dose will be  January 06, 2021. If Okay with cardiologist.  _X__ Stop Anti-inflammatories on 7 days before surgery. Last dose will be January 06, 2021               ibuprofen.            naproxen.             diclofenac.             celecoxib.             mefenamic acid.             etoricoxib.             indomethacin.             high-dose aspirin (low-dose aspirin is not normally considered to be an NSAID)  ____ Stop supplements 7 days before surgery .Last dose will be  January 06, 2021. Resume  after surgery.      If you have any questions regarding your pre-procedure instructions,  Please call Pre-admit Testing at [phone]

## 2021-01-11 ENCOUNTER — Inpatient Hospital Stay: Payer: Medicaid Other | Admitting: Urgent Care

## 2021-01-11 ENCOUNTER — Encounter: Payer: Self-pay | Admitting: Orthopedic Surgery

## 2021-01-11 NOTE — Progress Notes (Signed)
Perioperative Services  Pre-Admission/Anesthesia Testing Clinical Review  Date: 01/11/21  Patient Demographics:  Name: Keith Gibbs DOB:   1961-04-04 MRN:   [id number]  Planned Surgical Procedure(s):    Case: [id number] Date/Time: 01/14/21 0700   Procedure: TOTAL KNEE ARTHROPLASTY - Keith Gibbs to Assist (Right: Knee)   Anesthesia type: Choice   Pre-op diagnosis:      Primary osteoarthritis of right knee M17.11     Effusion of right knee M25.461   Location: [facility] OR ROOM 01 / [place] [facility]S FOR ANESTHESIA GROUP   Surgeons: Keith Knows, MD     NOTE: Available PAT nursing documentation and vital signs have been reviewed. Clinical nursing staff has updated patient's PMH/PSHx, current medication list, and drug allergies/intolerances to ensure comprehensive history available to assist in medical decision making as it pertains to the aforementioned surgical procedure and anticipated anesthetic course. Extensive review of available clinical information performed. Keith Gibbs PMH and PSHx updated with any diagnoses/procedures that  may have been inadvertently omitted during his intake with the pre-admission testing department's nursing staff.  Clinical Discussion:  Keith Gibbs is a 60 y.o. male who is submitted for pre-surgical anesthesia review and clearance prior to him undergoing the above procedure. Patient is a Current Smoker (32 pack years). Pertinent PMH includes: CAD, MI, angina, valvular insufficiency, G1DD, CVA, HTN, HLD, COPD, GERD (on daily PPI), PUD, OA, chronic pain syndrome, polysubstance abuse, depression, anxiety, bipolar disorder, schizophrenia.  Patient is followed by cardiology Keith Rolls, MD). Records from cardiologist requested for review. Patient has a PMH significant for cardiovascular disease. Information below obtained from available records in [facility].   Patient has suffered a "mild" MI and CVA in the past; he was unsure of the dates.   He underwent a diagnostic left heart  catheterization on 06/10/2016 revealing a 90% stenosis of the proximal to mid LAD.  DES x 1 was placed resulting in a 0% residual stenosis and TIMI-3 flow.  Myocardial perfusion imaging study performed on 03/01/2018 revealed an LVEF of 56%.  There was a moderate size and intensity reversible inferior wall defect consistent with ischemia and the RCA territory.  TTE performed on 03/05/2018 demonstrated a normal left ventricular systolic function, mild inferior hypokinesis, LVH with G1DD, and mild to moderate valvular insufficiency.  Repeat diagnostic left heart catheterization performed on 12/04/2019 revealed single-vessel CAD with moderate (60%) in-stent restenosis in the LAD.  Left ventricular systolic function normal with an EF of 65%.  PTCA/POBA was performed (see full interpretation of cardiovascular testing and intervention below).  Patient continues on daily DAPT therapy (ASA + clopidogrel); compliant with therapy with no evidence of GI bleeding. Patient on GDMT for his HTN and HLD diagnoses.  Last documented blood pressure reasonably controlled at 136/94 on currently prescribed ACEi and nitrate therapy.  Patient is on a statin for his HLD.  Patient does not have a diabetes diagnosis.  Patient does have a history of polysubstance abuse; uses marijuana, cocaine, and ETOH. Functional capacity, as defined by DASI, is felt to be >/= 4 METS.  Patient follows up with cardiology at defined intervals for ongoing evaluation and management.  Patient is scheduled for an elective total knee arthroplasty on 01/14/2021 with Dr. Hessie Gibbs.  Given patient's past medical history significant for cardiovascular diagnoses, presurgical cardiac clearance was sought by the PAT team. Per cardiology, "this patient is optimized for surgery and may proceed with the planned procedural course with an overall ACCEPTABLE risk stratification". This patient is on daily DAPT (ASA +  clopidogrel) therapy. He has been instructed on  recommendations for holding his clopidogrel for 7 days prior to his procedure with plans to restart as soon as postoperative bleeding risk felt to be minimized by his attending surgeon. The patient has been instructed that his last dose of his anticoagulant will be on 01/06/2021.  Patient has been asked to continue his daily low-dose ASA throughout the perioperative period.  Patient denies previous perioperative complications with anesthesia in the past. In review of the available records, it is noted that patient underwent a general anesthetic course here (ASA III) in 08/2017 without documented complications.   Vitals with BMI 01/05/2021 10/26/2020 10/26/2020  Height 6\' 3"  6\' 3"  -  Weight 218 lbs 11 oz 230 lbs -  BMI 63.87 56.43 -  Systolic 92 - 329  Diastolic 66 - 94  Pulse - - 75  Some encounter information is confidential and restricted. Go to Review Flowsheets activity to see all data.    Providers/Specialists:   NOTE: Primary physician provider listed below. Patient may have been seen by APP or partner within same practice.   PROVIDER ROLE / SPECIALTY LAST Fabio Bering, MD  Orthopedics (Surgeon) 12/23/2020  Flinchum, Kelby Aline, FNP  Primary Care Provider 09/08/2020  Neoma Laming, MD  Cardiology ???   Allergies:  Asenapine maleate, Latuda [lurasidone hcl], Lurasidone, Naproxen, and Saphris [asenapine]  Current Home Medications:   No current facility-administered medications for this encounter.    albuterol (VENTOLIN HFA) 108 (90 Base) MCG/ACT inhaler   aspirin EC 81 MG tablet   atorvastatin (LIPITOR) 80 MG tablet   Budeson-Glycopyrrol-Formoterol (BREZTRI AEROSPHERE) 160-9-4.8 MCG/ACT AERO   buPROPion (WELLBUTRIN SR) 150 MG 12 hr tablet   cetirizine (ZYRTEC) 10 MG tablet   citalopram (CELEXA) 20 MG tablet   clopidogrel (PLAVIX) 75 MG tablet   cyclobenzaprine (FLEXERIL) 10 MG tablet   DULoxetine (CYMBALTA) 60 MG capsule   enalapril (VASOTEC) 2.5 MG tablet   isosorbide  mononitrate (IMDUR) 60 MG 24 hr tablet   nitroGLYCERIN (NITROSTAT) 0.4 MG SL tablet   omeprazole (PRILOSEC) 40 MG capsule   oxyCODONE-acetaminophen (PERCOCET/ROXICET) 5-325 MG tablet   predniSONE (DELTASONE) 10 MG tablet   tamsulosin (FLOMAX) 0.4 MG CAPS capsule   traMADol (ULTRAM) 50 MG tablet   traZODone (DESYREL) 100 MG tablet    sodium chloride flush (NS) 0.9 % injection 3 mL   History:   Past Medical History:  Diagnosis Date   Anginal pain (HCC)    Anxiety    Arthritis    Bipolar 1 disorder (HCC)    Bursitis    CAD (coronary artery disease)    Chronic pain    Cocaine abuse (HCC)    COPD (chronic obstructive pulmonary disease) (HCC)    Current use of long term anticoagulation    DAPT (ASA + clopidogrel)   Depression    Diverticulitis    Dyspnea    GERD (gastroesophageal reflux disease)    Grade I diastolic dysfunction    Hepatitis C 2012   No longer has Hep C   HLD (hyperlipidemia)    Hypertension    MI (myocardial infarction) (HCC)    PUD (peptic ulcer disease)    S/P angioplasty with stent 06/10/2016   a.) 90% stenosis of pLAD to mLAD - 2.5 x 18 mm Xience Alpine (DES x 1) placed to pLAD   S/P PTCA (percutaneous transluminal coronary angioplasty) 12/04/2019   a.) 60% in stent restenosis of DES to pLAD; LVEF 65%.  Schizophrenia ([place])    Stroke [facility])    Valvular insufficiency    a.) Mild MR, TR, PR; mild to moderate AR on 03/05/2018 TTE   Past Surgical History:  Procedure Laterality Date   ABDOMINAL SURGERY     removed small piece of intestines due to [facility] Diverticulosis   APPENDECTOMY     BACK SURGERY     CARDIAC CATHETERIZATION Left 06/10/2016   Procedure: Left Heart Cath and Coronary Angiography;  Surgeon: Dionisio David, MD;  Location: [place] CV LAB;  Service: Cardiovascular;  Laterality: Left;   CARDIAC CATHETERIZATION N/A 06/10/2016   Procedure: Coronary Stent Intervention;  Surgeon: Yolonda Kida, MD;  Location: [place] CV LAB;   Service: Cardiovascular;  Laterality: N/A;   COLON SURGERY     COLONOSCOPY     COLONOSCOPY WITH PROPOFOL N/A 01/05/2017   Procedure: COLONOSCOPY WITH PROPOFOL;  Surgeon: Jonathon Bellows, MD;  Location: [facility] ENDOSCOPY;  Service: Endoscopy;  Laterality: N/A;   COLONOSCOPY WITH PROPOFOL N/A 02/13/2020   Procedure: COLONOSCOPY WITH PROPOFOL;  Surgeon: Virgel Manifold, MD;  Location: [facility] ENDOSCOPY;  Service: Endoscopy;  Laterality: N/A;   CORONARY ANGIOPLASTY WITH STENT PLACEMENT     ESOPHAGOGASTRODUODENOSCOPY (EGD) WITH PROPOFOL N/A 01/05/2017   Procedure: ESOPHAGOGASTRODUODENOSCOPY (EGD) WITH PROPOFOL;  Surgeon: Jonathon Bellows, MD;  Location: [facility] ENDOSCOPY;  Service: Endoscopy;  Laterality: N/A;   ESOPHAGOGASTRODUODENOSCOPY (EGD) WITH PROPOFOL N/A 02/13/2020   Procedure: ESOPHAGOGASTRODUODENOSCOPY (EGD) WITH PROPOFOL;  Surgeon: Virgel Manifold, MD;  Location: [facility] ENDOSCOPY;  Service: Endoscopy;  Laterality: N/A;   INTRAVASCULAR PRESSURE WIRE/FFR STUDY N/A 12/04/2019   Procedure: INTRAVASCULAR PRESSURE WIRE/FFR STUDY;  Surgeon: Sherren Mocha, MD;  Location: [place] CV LAB;  Service: Cardiovascular;  Laterality: N/A;   KNEE ARTHROSCOPY WITH MEDIAL MENISECTOMY Right 09/05/2017   Procedure: KNEE ARTHROSCOPY WITH MEDIAL AND LATERAL  MENISECTOMY PARTIAL SYNOVECTOMY;  Surgeon: Keith Knows, MD;  Location: [facility] ORS;  Service: Orthopedics;  Laterality: Right;   LEFT HEART CATH AND CORONARY ANGIOGRAPHY N/A 12/04/2019   Procedure: LEFT HEART CATH AND CORONARY ANGIOGRAPHY;  Surgeon: Sherren Mocha, MD;  Location: [place] CV LAB;  Service: Cardiovascular;  Laterality: N/A;   SHOULDER SURGERY Right 04/09/2012   SPINE SURGERY     Family History  Problem Relation Age of Onset   Osteoarthritis Mother    Heart disease Mother    Hypertension Mother    Depression Mother    Heart disease Father    Early death Father    Hypertension Father    Heart attack Father    Hypertension Sister     Prostate cancer Neg Hx    Bladder Cancer Neg Hx    Kidney cancer Neg Hx    Social History   Tobacco Use   Smoking status: Every Day    Packs/day: 1.00    Years: 32.00    Pack years: 32.00    Types: Cigarettes   Smokeless tobacco: Never   Tobacco comments:    Quit smoking 08/10/2020  Vaping Use   Vaping Use: Never used  Substance Use Topics   Alcohol use: Yes    Alcohol/week: 6.0 standard drinks    Types: 6 Cans of beer per week    Comment: occassionally    Drug use: Yes    Types: Cocaine, Marijuana    Comment: last on saturday    Pertinent Clinical Results:  LABS: Labs reviewed: Acceptable for surgery.  No visits with results within 3 Day(s) from this visit.  Latest known  visit with results is:  Hospital Outpatient Visit on 01/05/2021  Component Date Value Ref Range Status   MRSA, PCR 01/05/2021 NEGATIVE  NEGATIVE Final   Staphylococcus aureus 01/05/2021 NEGATIVE  NEGATIVE Final   Comment: (NOTE) The Xpert SA Assay (FDA approved for NASAL specimens in patients 38 years of age and older), is one component of a comprehensive surveillance program. It is not intended to diagnose infection nor to guide or monitor treatment. Performed at [facility], [address] [place], [place] [address]    WBC 01/05/2021 6.9  4.0 - 10.5 K/uL Final   RBC 01/05/2021 4.25  4.22 - 5.81 MIL/uL Final   Hemoglobin 01/05/2021 13.8  13.0 - 17.0 g/dL Final   HCT 01/05/2021 39.7  39.0 - 52.0 % Final   MCV 01/05/2021 93.4  80.0 - 100.0 fL Final   MCH 01/05/2021 32.5  26.0 - 34.0 pg Final   MCHC 01/05/2021 34.8  30.0 - 36.0 g/dL Final   RDW 01/05/2021 13.2  11.5 - 15.5 % Final   Platelets 01/05/2021 181  150 - 400 K/uL Final   nRBC 01/05/2021 0.0  0.0 - 0.2 % Final   Neutrophils Relative % 01/05/2021 59  % Final   Neutro Abs 01/05/2021 4.0  1.7 - 7.7 K/uL Final   Lymphocytes Relative 01/05/2021 30  % Final   Lymphs Abs 01/05/2021 2.1  0.7 - 4.0 K/uL Final   Monocytes Relative  01/05/2021 7  % Final   Monocytes Absolute 01/05/2021 0.5  0.1 - 1.0 K/uL Final   Eosinophils Relative 01/05/2021 4  % Final   Eosinophils Absolute 01/05/2021 0.3  0.0 - 0.5 K/uL Final   Basophils Relative 01/05/2021 0  % Final   Basophils Absolute 01/05/2021 0.0  0.0 - 0.1 K/uL Final   Immature Granulocytes 01/05/2021 0  % Final   Abs Immature Granulocytes 01/05/2021 0.03  0.00 - 0.07 K/uL Final   Performed at [facility], [place], [place] [address]   Sodium 01/05/2021 137  135 - 145 mmol/L Final   Potassium 01/05/2021 4.7  3.5 - 5.1 mmol/L Final   Chloride 01/05/2021 104  98 - 111 mmol/L Final   CO2 01/05/2021 27  22 - 32 mmol/L Final   Glucose, Bld 01/05/2021 96  70 - 99 mg/dL Final   Glucose reference range applies only to samples taken after fasting for at least 8 hours.   BUN 01/05/2021 16  6 - 20 mg/dL Final   Creatinine, Ser 01/05/2021 0.89  0.61 - 1.24 mg/dL Final   Calcium 01/05/2021 8.9  8.9 - 10.3 mg/dL Final   Total Protein 01/05/2021 6.3 (A) 6.5 - 8.1 g/dL Final   Albumin 01/05/2021 3.8  3.5 - 5.0 g/dL Final   AST 01/05/2021 22  15 - 41 U/L Final   ALT 01/05/2021 18  0 - 44 U/L Final   Alkaline Phosphatase 01/05/2021 63  38 - 126 U/L Final   Total Bilirubin 01/05/2021 0.6  0.3 - 1.2 mg/dL Final   GFR, Estimated 01/05/2021 >60  >60 mL/min Final   Comment: (NOTE) Calculated using the CKD-EPI Creatinine Equation (2021)    Anion gap 01/05/2021 6  5 - 15 Final   Performed at [facility], [place], [place] [address]   Color, Urine 01/05/2021 YELLOW (A) YELLOW Final   APPearance 01/05/2021 HAZY (A) CLEAR Final   Specific Gravity, Urine 01/05/2021 1.018  1.005 - 1.030 Final   pH 01/05/2021 5.0  5.0 - 8.0 Final   Glucose, UA 01/05/2021 NEGATIVE  NEGATIVE mg/dL Final   Hgb urine dipstick 01/05/2021 SMALL (A) NEGATIVE Final   Bilirubin Urine 01/05/2021 NEGATIVE  NEGATIVE Final   Ketones, ur 01/05/2021 NEGATIVE  NEGATIVE  mg/dL Final   Protein, ur 01/05/2021 NEGATIVE  NEGATIVE mg/dL Final   Nitrite 01/05/2021 NEGATIVE  NEGATIVE Final   Leukocytes,Ua 01/05/2021 NEGATIVE  NEGATIVE Final   RBC / HPF 01/05/2021 0-5  0 - 5 RBC/hpf Final   WBC, UA 01/05/2021 NONE SEEN  0 - 5 WBC/hpf Final   Bacteria, UA 01/05/2021 RARE (A) NONE SEEN Final   Squamous Epithelial / LPF 01/05/2021 NONE SEEN  0 - 5 Final   Mucus 01/05/2021 PRESENT   Final   Performed at [facility], [address] [place], [place] [address]   ABO/RH(D) 01/05/2021 O POS   Final   Antibody Screen 01/05/2021 NEG   Final   Sample Expiration 01/05/2021 01/19/2021,2359   Final   Extend sample reason 01/05/2021    Final                   Value:NO TRANSFUSIONS OR PREGNANCY IN THE PAST 3 MONTHS Performed at [facility], [address] [place], [place] [address]     ECG: Date: 01/05/2021 Time ECG obtained: 1034 AM Rate: 44 bpm Rhythm: sinus bradycardia Axis (leads I and aVF): Normal Intervals: PR 166 ms. QRS 92 ms. QTc 406 ms. ST segment and T wave changes: No evidence of acute ST segment elevation or depression Comparison: Similar to previous tracing obtained on 12/04/2019  IMAGING / PROCEDURES: LEFT HEART CATHETERIZATION AND CORONARY ANGIOGRAPHY performed on 12/04/2019 Single-vessel CAD with moderate in-stent restenosis in the LAD; negative pressure wire evaluation Previously placed DES to the LAD is widely patent PTCA was performed Proximal LAD to mid LAD lesion 60% stenosed Patent left main, LCx, and RCA without significant stenosis Normal left ventricular function with an EF of 65% Normal LVEDP Recommendations medical therapy and tobacco cessation  TRANSTHORACIC ECHOCARDIOGRAM performed on 03/05/2018 Suboptimal study due to poor windows Severely dilated LA, mildly dilated RA, aortic root, and ascending aorta with ventricles appear normal in size Normal left ventricular systolic function Mild inferior hypokinesis  mild LVH with G1DD Mild pulmonary valve regurgitation Mild tricuspid valve regurgitation Mild mitral valve regurgitation Mild to moderate aortic regurgitation Normal PASP Trivial anterior pericardial effusion without hemodynamic compromise or tamponade.  LEFT HEART CATHETERIZATION AND CORONARY ANGIOGRAPHY performed on 06/10/2016 LVEF 60% with high-grade lesion in the mid LAD and no significant LCx or RCA disease 90% stenosis of the proximal to mid LAD Successful PCI with placement of a 2.5 x 18 mm Xience Alpine DES x 1 to the LAD  Impression and Plan:  Keith Gibbs has been referred for pre-anesthesia review and clearance prior to him undergoing the planned anesthetic and procedural courses. Available labs, pertinent testing, and imaging results were personally reviewed by me. This patient has been appropriately cleared by cardiology with an overall ACCEPTABLE risk of significant perioperative cardiovascular complications.  Based on clinical review performed today (01/11/21), barring any significant acute changes in the patient's overall condition, it is anticipated that he will be able to proceed with the planned surgical intervention. Any acute changes in clinical condition may necessitate his procedure being postponed and/or cancelled.  Additionally, patient was advised that a UDS will be performed on the day of his procedure, and if found to be (+), that his procedure would likely  be canceled; verbalized understanding. Patient will meet with anesthesia team (MD and/or CRNA) on the day of his procedure for preoperative evaluation/assessment. Questions regarding anesthetic course will be fielded at that time.   Pre-surgical instructions were reviewed with the patient during his PAT appointment and questions were fielded by PAT clinical staff. Patient was advised that if any questions or concerns arise prior to his procedure then he should return a call to PAT and/or his surgeon's office to  discuss.  Honor Loh, MSN, APRN, FNP-C, CEN [facility]  Peri-operative Services Nurse Practitioner Phone: [phone] Fax: [phone] 01/11/21 4:27 PM  NOTE: This note has been prepared using Dragon dictation software. Despite my best ability to proofread, there is always the potential that unintentional transcriptional errors may still occur from this process.

## 2021-01-12 ENCOUNTER — Other Ambulatory Visit
Admission: RE | Admit: 2021-01-12 | Discharge: 2021-01-12 | Disposition: A | Discharge status: Home / Self Care | Payer: Medicaid Other | Source: Ambulatory Visit | Attending: Orthopedic Surgery | Admitting: Orthopedic Surgery

## 2021-01-12 ENCOUNTER — Other Ambulatory Visit: Payer: Self-pay

## 2021-01-12 DIAGNOSIS — Z01812 Encounter for preprocedural laboratory examination: Secondary | ICD-10-CM | POA: Diagnosis present

## 2021-01-12 DIAGNOSIS — Z20822 Contact with and (suspected) exposure to covid-19: Secondary | ICD-10-CM | POA: Diagnosis not present

## 2021-01-12 LAB — SARS CORONAVIRUS 2 (TAT 6-24 HRS): SARS Coronavirus 2: NEGATIVE

## 2021-01-13 MED ORDER — ORAL CARE MOUTH RINSE: 15.0000 mL | Freq: Once | OROMUCOSAL | Status: DC

## 2021-01-13 MED ORDER — CEFAZOLIN SODIUM-DEXTROSE 2-4 GM/100ML-% IV SOLN: 2.0000 g | INTRAVENOUS | Status: DC

## 2021-01-13 MED ORDER — CHLORHEXIDINE GLUCONATE 0.12 % MT SOLN: 15.0000 mL | Freq: Once | OROMUCOSAL | Status: DC

## 2021-01-13 MED ORDER — LACTATED RINGERS IV SOLN: INTRAVENOUS | Status: DC

## 2021-01-14 ENCOUNTER — Ambulatory Visit
Admission: RE | Admit: 2021-01-14 | Discharge: 2021-01-14 | Disposition: A | Discharge status: Home / Self Care | Payer: Medicaid Other | Attending: Orthopedic Surgery | Admitting: Orthopedic Surgery

## 2021-01-14 ENCOUNTER — Encounter
Admission: RE | Disposition: A | Discharge status: Home / Self Care | Payer: Self-pay | Source: Home / Self Care | Attending: Orthopedic Surgery

## 2021-01-14 ENCOUNTER — Other Ambulatory Visit: Payer: Self-pay

## 2021-01-14 DIAGNOSIS — Z79899 Other long term (current) drug therapy: Secondary | ICD-10-CM | POA: Insufficient documentation

## 2021-01-14 DIAGNOSIS — M1711 Unilateral primary osteoarthritis, right knee: Secondary | ICD-10-CM | POA: Diagnosis present

## 2021-01-14 DIAGNOSIS — Z8673 Personal history of transient ischemic attack (TIA), and cerebral infarction without residual deficits: Secondary | ICD-10-CM | POA: Insufficient documentation

## 2021-01-14 DIAGNOSIS — F17210 Nicotine dependence, cigarettes, uncomplicated: Secondary | ICD-10-CM | POA: Diagnosis not present

## 2021-01-14 DIAGNOSIS — Z79891 Long term (current) use of opiate analgesic: Secondary | ICD-10-CM | POA: Insufficient documentation

## 2021-01-14 DIAGNOSIS — Z7982 Long term (current) use of aspirin: Secondary | ICD-10-CM | POA: Diagnosis not present

## 2021-01-14 DIAGNOSIS — Z7902 Long term (current) use of antithrombotics/antiplatelets: Secondary | ICD-10-CM | POA: Insufficient documentation

## 2021-01-14 DIAGNOSIS — M25461 Effusion, right knee: Secondary | ICD-10-CM | POA: Diagnosis not present

## 2021-01-14 DIAGNOSIS — Z7952 Long term (current) use of systemic steroids: Secondary | ICD-10-CM | POA: Diagnosis not present

## 2021-01-14 DIAGNOSIS — Z5309 Procedure and treatment not carried out because of other contraindication: Secondary | ICD-10-CM | POA: Insufficient documentation

## 2021-01-14 HISTORY — DX: Endocarditis, valve unspecified: I38

## 2021-01-14 HISTORY — DX: Atherosclerotic heart disease of native coronary artery without angina pectoris: I25.10

## 2021-01-14 HISTORY — DX: Other ill-defined heart diseases: I51.89

## 2021-01-14 HISTORY — DX: Bipolar disorder, unspecified: F31.9

## 2021-01-14 HISTORY — DX: Other psychoactive substance abuse, uncomplicated: F19.10

## 2021-01-14 HISTORY — DX: Long term (current) use of anticoagulants: Z79.01

## 2021-01-14 HISTORY — DX: Hyperlipidemia, unspecified: E78.5

## 2021-01-14 HISTORY — DX: Schizophrenia, unspecified: F20.9

## 2021-01-14 LAB — URINE DRUG SCREEN, QUALITATIVE (ARMC ONLY)
Amphetamines, Ur Screen: NOT DETECTED
Barbiturates, Ur Screen: NOT DETECTED
Benzodiazepine, Ur Scrn: NOT DETECTED
Cannabinoid 50 Ng, Ur ~~LOC~~: POSITIVE — AB
Cocaine Metabolite,Ur ~~LOC~~: NOT DETECTED
MDMA (Ecstasy)Ur Screen: NOT DETECTED
Methadone Scn, Ur: NOT DETECTED
Opiate, Ur Screen: NOT DETECTED
Phencyclidine (PCP) Ur S: NOT DETECTED
Tricyclic, Ur Screen: NOT DETECTED

## 2021-01-14 SURGERY — ARTHROPLASTY, KNEE, TOTAL
Anesthesia: Choice

## 2021-01-14 MED ORDER — LIDOCAINE HCL (PF) 2 % IJ SOLN
INTRAMUSCULAR | Status: AC
  Filled 2021-01-14: qty 5

## 2021-01-14 MED ORDER — SUCCINYLCHOLINE CHLORIDE 200 MG/10ML IV SOSY
PREFILLED_SYRINGE | INTRAVENOUS | Status: AC
  Filled 2021-01-14: qty 10

## 2021-01-14 MED ORDER — ONDANSETRON HCL 4 MG/2ML IJ SOLN
INTRAMUSCULAR | Status: AC
  Filled 2021-01-14: qty 2

## 2021-01-14 MED ORDER — ROCURONIUM BROMIDE 10 MG/ML (PF) SYRINGE
PREFILLED_SYRINGE | INTRAVENOUS | Status: AC
  Filled 2021-01-14: qty 10

## 2021-01-14 MED ORDER — BUPIVACAINE HCL (PF) 0.5 % IJ SOLN
INTRAMUSCULAR | Status: AC
  Filled 2021-01-14: qty 10

## 2021-01-14 MED ORDER — PHENYLEPHRINE HCL (PRESSORS) 10 MG/ML IV SOLN
INTRAVENOUS | Status: AC
  Filled 2021-01-14: qty 1

## 2021-01-14 MED ORDER — FENTANYL CITRATE (PF) 100 MCG/2ML IJ SOLN
INTRAMUSCULAR | Status: AC
  Filled 2021-01-14: qty 2

## 2021-01-14 MED ORDER — PROPOFOL 1000 MG/100ML IV EMUL
INTRAVENOUS | Status: AC
  Filled 2021-01-14: qty 100

## 2021-01-14 MED ORDER — SEVOFLURANE IN SOLN
RESPIRATORY_TRACT | Status: AC
  Filled 2021-01-14: qty 250

## 2021-01-14 MED ORDER — MIDAZOLAM HCL 2 MG/2ML IJ SOLN
INTRAMUSCULAR | Status: AC
  Filled 2021-01-14: qty 2

## 2021-01-14 MED ORDER — DEXMEDETOMIDINE (PRECEDEX) IN NS 20 MCG/5ML (4 MCG/ML) IV SYRINGE
PREFILLED_SYRINGE | INTRAVENOUS | Status: AC
  Filled 2021-01-14: qty 5

## 2021-01-14 MED ORDER — DEXAMETHASONE SODIUM PHOSPHATE 10 MG/ML IJ SOLN
INTRAMUSCULAR | Status: AC
  Filled 2021-01-14: qty 1

## 2021-01-14 SURGICAL SUPPLY — 64 items
APL PRP STRL LF DISP 70% ISPRP (MISCELLANEOUS) ×2
BLADE SAGITTAL 25.0X1.19X90 (BLADE) ×3 IMPLANT
BLADE SAW 90X13X1.19 OSCILLAT (BLADE) ×3 IMPLANT
BNDG ELASTIC 6X5.8 VLCR STR LF (GAUZE/BANDAGES/DRESSINGS) ×3 IMPLANT
CANISTER SUCT 1200ML W/VALVE (MISCELLANEOUS) ×3 IMPLANT
CANISTER WOUND CARE 500ML ATS (WOUND CARE) ×3 IMPLANT
CHLORAPREP W/TINT 26 (MISCELLANEOUS) ×6 IMPLANT
COOLER POLAR GLACIER W/PUMP (MISCELLANEOUS) ×3 IMPLANT
COVER WAND RF STERILE (DRAPES) ×3 IMPLANT
CUFF TOURN SGL QUICK 24 (TOURNIQUET CUFF)
CUFF TOURN SGL QUICK 34 (TOURNIQUET CUFF)
CUFF TRNQT CYL 24X4X16.5-23 (TOURNIQUET CUFF) IMPLANT
CUFF TRNQT CYL 34X4.125X (TOURNIQUET CUFF) IMPLANT
DRAPE 3/4 80X56 (DRAPES) ×6 IMPLANT
DRSG MEPILEX SACRM 8.7X9.8 (GAUZE/BANDAGES/DRESSINGS) ×3 IMPLANT
ELECT CAUTERY BLADE 6.4 (BLADE) ×3 IMPLANT
ELECT REM PT RETURN 9FT ADLT (ELECTROSURGICAL) ×2
ELECTRODE REM PT RTRN 9FT ADLT (ELECTROSURGICAL) ×2 IMPLANT
GAUZE SPONGE 4X4 12PLY STRL (GAUZE/BANDAGES/DRESSINGS) ×3 IMPLANT
GAUZE XEROFORM 1X8 LF (GAUZE/BANDAGES/DRESSINGS) ×3 IMPLANT
GLOVE SURG ORTHO LTX SZ8 (GLOVE) ×3 IMPLANT
GLOVE SURG SYN 9.0  PF PI (GLOVE) ×1
GLOVE SURG SYN 9.0 PF PI (GLOVE) ×2 IMPLANT
GLOVE SURG UNDER LTX SZ8 (GLOVE) ×3 IMPLANT
GLOVE SURG UNDER POLY LF SZ9 (GLOVE) ×3 IMPLANT
GOWN SRG 2XL LVL 4 RGLN SLV (GOWNS) ×2 IMPLANT
GOWN STRL NON-REIN 2XL LVL4 (GOWNS) ×2
GOWN STRL REUS W/ TWL LRG LVL3 (GOWN DISPOSABLE) ×2 IMPLANT
GOWN STRL REUS W/ TWL XL LVL3 (GOWN DISPOSABLE) ×2 IMPLANT
GOWN STRL REUS W/TWL LRG LVL3 (GOWN DISPOSABLE) ×2
GOWN STRL REUS W/TWL XL LVL3 (GOWN DISPOSABLE) ×2
HOLDER FOLEY CATH W/STRAP (MISCELLANEOUS) ×3 IMPLANT
HOOD PEEL AWAY FLYTE STAYCOOL (MISCELLANEOUS) ×6 IMPLANT
IRRIGATION SURGIPHOR STRL (IV SOLUTION) IMPLANT
IV NS IRRIG 3000ML ARTHROMATIC (IV SOLUTION) ×3 IMPLANT
KIT PREVENA INCISION MGT20CM45 (CANNISTER) ×3 IMPLANT
KIT TURNOVER KIT A (KITS) ×3 IMPLANT
MANIFOLD NEPTUNE II (INSTRUMENTS) ×3 IMPLANT
NDL SAFETY ECLIPSE 18X1.5 (NEEDLE) ×2 IMPLANT
NDL SPNL 18GX3.5 QUINCKE PK (NEEDLE) ×1 IMPLANT
NDL SPNL 20GX3.5 QUINCKE YW (NEEDLE) ×1 IMPLANT
NEEDLE HYPO 18GX1.5 SHARP (NEEDLE) ×2
NEEDLE SPNL 18GX3.5 QUINCKE PK (NEEDLE) ×2 IMPLANT
NEEDLE SPNL 20GX3.5 QUINCKE YW (NEEDLE) ×2 IMPLANT
NS IRRIG 1000ML POUR BTL (IV SOLUTION) ×3 IMPLANT
PACK TOTAL KNEE (MISCELLANEOUS) ×3 IMPLANT
PAD WRAPON POLAR KNEE (MISCELLANEOUS) ×2 IMPLANT
PENCIL SMOKE EVACUATOR COATED (MISCELLANEOUS) ×3 IMPLANT
PULSAVAC PLUS IRRIG FAN TIP (DISPOSABLE) ×2
SCALPEL PROTECTED #10 DISP (BLADE) ×6 IMPLANT
STAPLER SKIN PROX 35W (STAPLE) ×3 IMPLANT
SUCTION FRAZIER HANDLE 10FR (MISCELLANEOUS) ×1
SUCTION TUBE FRAZIER 10FR DISP (MISCELLANEOUS) ×2 IMPLANT
SUT DVC 2 QUILL PDO  T11 36X36 (SUTURE) ×1
SUT DVC 2 QUILL PDO T11 36X36 (SUTURE) ×2 IMPLANT
SUT ETHIBOND 2 V 37 (SUTURE) IMPLANT
SUT V-LOC 90 ABS DVC 3-0 CL (SUTURE) ×3 IMPLANT
SYR 20ML LL LF (SYRINGE) ×3 IMPLANT
SYR 50ML LL SCALE MARK (SYRINGE) ×6 IMPLANT
TIP FAN IRRIG PULSAVAC PLUS (DISPOSABLE) ×2 IMPLANT
TOWEL OR 17X26 4PK STRL BLUE (TOWEL DISPOSABLE) ×3 IMPLANT
TOWER CARTRIDGE SMART MIX (DISPOSABLE) ×3 IMPLANT
TRAY FOLEY MTR SLVR 16FR STAT (SET/KITS/TRAYS/PACK) ×3 IMPLANT
WRAPON POLAR PAD KNEE (MISCELLANEOUS) ×2

## 2021-01-14 NOTE — Progress Notes (Signed)
Surgery to be rescheduled. Patient had not stopped taking Plavix.

## 2021-01-15 ENCOUNTER — Other Ambulatory Visit
Admission: RE | Admit: 2021-01-15 | Discharge: 2021-01-15 | Disposition: A | Discharge status: Home / Self Care | Payer: Medicaid Other | Source: Ambulatory Visit | Attending: Orthopedic Surgery | Admitting: Orthopedic Surgery

## 2021-01-15 ENCOUNTER — Other Ambulatory Visit: Payer: Self-pay | Admitting: Orthopedic Surgery

## 2021-01-15 DIAGNOSIS — Z01812 Encounter for preprocedural laboratory examination: Secondary | ICD-10-CM | POA: Insufficient documentation

## 2021-01-15 DIAGNOSIS — Z20822 Contact with and (suspected) exposure to covid-19: Secondary | ICD-10-CM | POA: Insufficient documentation

## 2021-01-15 LAB — SARS CORONAVIRUS 2 (TAT 6-24 HRS): SARS Coronavirus 2: NEGATIVE

## 2021-01-18 ENCOUNTER — Telehealth: Payer: Self-pay

## 2021-01-18 NOTE — Telephone Encounter (Signed)
Just an FYI  Copied from [place] [id number]. Topic: General - Other >> Jan 18, 2021  9:57 AM Yvette Rack wrote: Reason for CRM: Pt reports that his surgery was rescheduled for tomorrow 01/19/21 for complete knee replacement. Pt stated he just wanted to make Dr. B aware.

## 2021-01-18 NOTE — Telephone Encounter (Signed)
Noted  

## 2021-01-19 ENCOUNTER — Observation Stay: Payer: Medicaid Other

## 2021-01-19 ENCOUNTER — Inpatient Hospital Stay: Payer: Medicaid Other | Admitting: Anesthesiology

## 2021-01-19 ENCOUNTER — Inpatient Hospital Stay
Admission: RE | Admit: 2021-01-19 | Discharge: 2021-01-21 | DRG: 470 | Disposition: A | Discharge status: Home / Self Care | Payer: Medicaid Other | Attending: Orthopedic Surgery | Admitting: Orthopedic Surgery

## 2021-01-19 ENCOUNTER — Encounter: Payer: Self-pay | Admitting: Orthopedic Surgery

## 2021-01-19 ENCOUNTER — Other Ambulatory Visit: Payer: Self-pay

## 2021-01-19 ENCOUNTER — Encounter
Admission: RE | Disposition: A | Discharge status: Home / Self Care | Payer: Self-pay | Source: Home / Self Care | Attending: Orthopedic Surgery

## 2021-01-19 DIAGNOSIS — I10 Essential (primary) hypertension: Secondary | ICD-10-CM | POA: Diagnosis present

## 2021-01-19 DIAGNOSIS — M19011 Primary osteoarthritis, right shoulder: Secondary | ICD-10-CM | POA: Diagnosis present

## 2021-01-19 DIAGNOSIS — F1411 Cocaine abuse, in remission: Secondary | ICD-10-CM | POA: Diagnosis present

## 2021-01-19 DIAGNOSIS — F411 Generalized anxiety disorder: Secondary | ICD-10-CM | POA: Diagnosis present

## 2021-01-19 DIAGNOSIS — Z8711 Personal history of peptic ulcer disease: Secondary | ICD-10-CM

## 2021-01-19 DIAGNOSIS — F32A Depression, unspecified: Secondary | ICD-10-CM | POA: Diagnosis present

## 2021-01-19 DIAGNOSIS — Z96651 Presence of right artificial knee joint: Secondary | ICD-10-CM

## 2021-01-19 DIAGNOSIS — E785 Hyperlipidemia, unspecified: Secondary | ICD-10-CM | POA: Diagnosis present

## 2021-01-19 DIAGNOSIS — Z7901 Long term (current) use of anticoagulants: Secondary | ICD-10-CM

## 2021-01-19 DIAGNOSIS — J449 Chronic obstructive pulmonary disease, unspecified: Secondary | ICD-10-CM | POA: Diagnosis present

## 2021-01-19 DIAGNOSIS — Z79899 Other long term (current) drug therapy: Secondary | ICD-10-CM

## 2021-01-19 DIAGNOSIS — Z8673 Personal history of transient ischemic attack (TIA), and cerebral infarction without residual deficits: Secondary | ICD-10-CM

## 2021-01-19 DIAGNOSIS — E119 Type 2 diabetes mellitus without complications: Secondary | ICD-10-CM | POA: Diagnosis present

## 2021-01-19 DIAGNOSIS — Z7902 Long term (current) use of antithrombotics/antiplatelets: Secondary | ICD-10-CM

## 2021-01-19 DIAGNOSIS — F23 Brief psychotic disorder: Secondary | ICD-10-CM | POA: Diagnosis present

## 2021-01-19 DIAGNOSIS — G8918 Other acute postprocedural pain: Secondary | ICD-10-CM

## 2021-01-19 DIAGNOSIS — Z8249 Family history of ischemic heart disease and other diseases of the circulatory system: Secondary | ICD-10-CM

## 2021-01-19 DIAGNOSIS — Z9861 Coronary angioplasty status: Secondary | ICD-10-CM

## 2021-01-19 DIAGNOSIS — F17210 Nicotine dependence, cigarettes, uncomplicated: Secondary | ICD-10-CM | POA: Diagnosis present

## 2021-01-19 DIAGNOSIS — Z7982 Long term (current) use of aspirin: Secondary | ICD-10-CM

## 2021-01-19 DIAGNOSIS — I2510 Atherosclerotic heart disease of native coronary artery without angina pectoris: Secondary | ICD-10-CM | POA: Diagnosis present

## 2021-01-19 DIAGNOSIS — K219 Gastro-esophageal reflux disease without esophagitis: Secondary | ICD-10-CM | POA: Diagnosis present

## 2021-01-19 DIAGNOSIS — E7800 Pure hypercholesterolemia, unspecified: Secondary | ICD-10-CM | POA: Diagnosis present

## 2021-01-19 DIAGNOSIS — M170 Bilateral primary osteoarthritis of knee: Principal | ICD-10-CM | POA: Diagnosis present

## 2021-01-19 DIAGNOSIS — F419 Anxiety disorder, unspecified: Secondary | ICD-10-CM | POA: Diagnosis present

## 2021-01-19 DIAGNOSIS — Z79891 Long term (current) use of opiate analgesic: Secondary | ICD-10-CM

## 2021-01-19 DIAGNOSIS — I252 Old myocardial infarction: Secondary | ICD-10-CM

## 2021-01-19 HISTORY — PX: TOTAL KNEE ARTHROPLASTY: SHX125

## 2021-01-19 LAB — URINE DRUG SCREEN, QUALITATIVE (ARMC ONLY)
Amphetamines, Ur Screen: NOT DETECTED
Barbiturates, Ur Screen: NOT DETECTED
Benzodiazepine, Ur Scrn: NOT DETECTED
Cannabinoid 50 Ng, Ur ~~LOC~~: NOT DETECTED
Cocaine Metabolite,Ur ~~LOC~~: NOT DETECTED
MDMA (Ecstasy)Ur Screen: NOT DETECTED
Methadone Scn, Ur: NOT DETECTED
Opiate, Ur Screen: NOT DETECTED
Phencyclidine (PCP) Ur S: NOT DETECTED
Tricyclic, Ur Screen: NOT DETECTED

## 2021-01-19 LAB — ABO/RH: ABO/RH(D): O POS

## 2021-01-19 SURGERY — ARTHROPLASTY, KNEE, TOTAL
Anesthesia: Spinal | Site: Knee | Laterality: Right

## 2021-01-19 MED ORDER — LACTATED RINGERS IV SOLN: INTRAVENOUS | Status: DC

## 2021-01-19 MED ORDER — BUDESON-GLYCOPYRROL-FORMOTEROL 160-9-4.8 MCG/ACT IN AERO: 2.0000 | INHALATION_SPRAY | Freq: Two times a day (BID) | RESPIRATORY_TRACT | Status: DC

## 2021-01-19 MED ORDER — FENTANYL CITRATE (PF) 100 MCG/2ML IJ SOLN
INTRAMUSCULAR | Status: AC
  Filled 2021-01-19: qty 2

## 2021-01-19 MED ORDER — TRAMADOL HCL 50 MG PO TABS
50.0000 mg | ORAL_TABLET | Freq: Four times a day (QID) | ORAL | Status: DC
Start: 2021-01-19 — End: 2021-01-21
  Administered 2021-01-19 – 2021-01-20 (×4): 50 mg via ORAL
  Filled 2021-01-19 (×4): qty 1

## 2021-01-19 MED ORDER — DIPHENHYDRAMINE HCL 12.5 MG/5ML PO ELIX: 12.5000 mg | ORAL_SOLUTION | ORAL | Status: DC | PRN

## 2021-01-19 MED ORDER — CEFAZOLIN SODIUM-DEXTROSE 2-4 GM/100ML-% IV SOLN
2.0000 g | INTRAVENOUS | Status: AC
  Administered 2021-01-19: 2 g via INTRAVENOUS

## 2021-01-19 MED ORDER — SODIUM CHLORIDE 0.9 % IV SOLN: INTRAVENOUS | Status: DC

## 2021-01-19 MED ORDER — ACETAMINOPHEN 500 MG PO TABS
1000.0000 mg | ORAL_TABLET | Freq: Four times a day (QID) | ORAL | Status: AC
  Administered 2021-01-19 – 2021-01-20 (×3): 1000 mg via ORAL
  Filled 2021-01-19 (×3): qty 2

## 2021-01-19 MED ORDER — MORPHINE SULFATE (PF) 10 MG/ML IV SOLN
INTRAVENOUS | Status: DC | PRN
  Administered 2021-01-19: 10 mg

## 2021-01-19 MED ORDER — CHLORHEXIDINE GLUCONATE 0.12 % MT SOLN: 15.0000 mL | Freq: Once | OROMUCOSAL | Status: AC

## 2021-01-19 MED ORDER — ISOSORBIDE MONONITRATE ER 30 MG PO TB24
60.0000 mg | ORAL_TABLET | Freq: Every day | ORAL | Status: DC
  Administered 2021-01-20 – 2021-01-21 (×2): 60 mg via ORAL
  Filled 2021-01-19 (×2): qty 2

## 2021-01-19 MED ORDER — 0.9 % SODIUM CHLORIDE (POUR BTL) OPTIME
TOPICAL | Status: DC | PRN
  Administered 2021-01-19: 1000 mL

## 2021-01-19 MED ORDER — MORPHINE SULFATE (PF) 10 MG/ML IV SOLN
INTRAVENOUS | Status: AC
  Filled 2021-01-19: qty 1

## 2021-01-19 MED ORDER — PHENYLEPHRINE HCL (PRESSORS) 10 MG/ML IV SOLN
INTRAVENOUS | Status: AC
  Filled 2021-01-19: qty 1

## 2021-01-19 MED ORDER — DEXAMETHASONE SODIUM PHOSPHATE 4 MG/ML IJ SOLN
INTRAMUSCULAR | Status: DC | PRN
  Administered 2021-01-19: 5 mg via INTRAVENOUS

## 2021-01-19 MED ORDER — MOMETASONE FURO-FORMOTEROL FUM 200-5 MCG/ACT IN AERO
2.0000 | INHALATION_SPRAY | Freq: Two times a day (BID) | RESPIRATORY_TRACT | Status: DC
  Administered 2021-01-19 – 2021-01-21 (×3): 2 via RESPIRATORY_TRACT
  Filled 2021-01-19: qty 8.8

## 2021-01-19 MED ORDER — EPHEDRINE 5 MG/ML INJ
INTRAVENOUS | Status: AC
  Filled 2021-01-19: qty 10

## 2021-01-19 MED ORDER — FENTANYL CITRATE (PF) 100 MCG/2ML IJ SOLN: 25.0000 ug | INTRAMUSCULAR | Status: DC | PRN

## 2021-01-19 MED ORDER — BISACODYL 10 MG RE SUPP: 10.0000 mg | Freq: Every day | RECTAL | Status: DC | PRN

## 2021-01-19 MED ORDER — MAGNESIUM CITRATE PO SOLN
1.0000 | Freq: Once | ORAL | Status: DC | PRN
  Filled 2021-01-19: qty 296

## 2021-01-19 MED ORDER — BUPIVACAINE LIPOSOME 1.3 % IJ SUSP
INTRAMUSCULAR | Status: AC
  Filled 2021-01-19: qty 20

## 2021-01-19 MED ORDER — ASPIRIN 81 MG PO CHEW
81.0000 mg | CHEWABLE_TABLET | Freq: Two times a day (BID) | ORAL | Status: DC
  Administered 2021-01-19 – 2021-01-21 (×4): 81 mg via ORAL
  Filled 2021-01-19 (×4): qty 1

## 2021-01-19 MED ORDER — UMECLIDINIUM BROMIDE 62.5 MCG/INH IN AEPB
1.0000 | INHALATION_SPRAY | Freq: Every day | RESPIRATORY_TRACT | Status: DC
  Administered 2021-01-19 – 2021-01-21 (×3): 1 via RESPIRATORY_TRACT
  Filled 2021-01-19: qty 7

## 2021-01-19 MED ORDER — OXYCODONE HCL 5 MG PO TABS
5.0000 mg | ORAL_TABLET | ORAL | Status: DC | PRN
  Administered 2021-01-19: 10 mg via ORAL
  Filled 2021-01-19: qty 2
  Filled 2021-01-19: qty 1

## 2021-01-19 MED ORDER — CLOPIDOGREL BISULFATE 75 MG PO TABS
75.0000 mg | ORAL_TABLET | Freq: Every day | ORAL | Status: DC
  Administered 2021-01-20 – 2021-01-21 (×2): 75 mg via ORAL
  Filled 2021-01-19 (×2): qty 1

## 2021-01-19 MED ORDER — PROPOFOL 1000 MG/100ML IV EMUL
INTRAVENOUS | Status: AC
  Filled 2021-01-19: qty 100

## 2021-01-19 MED ORDER — FENTANYL CITRATE (PF) 100 MCG/2ML IJ SOLN
INTRAMUSCULAR | Status: DC | PRN
  Administered 2021-01-19 (×2): 50 ug via INTRAVENOUS

## 2021-01-19 MED ORDER — TRAZODONE HCL 100 MG PO TABS
100.0000 mg | ORAL_TABLET | Freq: Every day | ORAL | Status: DC
  Administered 2021-01-19 – 2021-01-20 (×2): 100 mg via ORAL
  Filled 2021-01-19 (×2): qty 1

## 2021-01-19 MED ORDER — ATORVASTATIN CALCIUM 20 MG PO TABS
80.0000 mg | ORAL_TABLET | Freq: Every day | ORAL | Status: DC
  Administered 2021-01-20 – 2021-01-21 (×2): 80 mg via ORAL
  Filled 2021-01-19 (×2): qty 4

## 2021-01-19 MED ORDER — MIDAZOLAM HCL 2 MG/2ML IJ SOLN
INTRAMUSCULAR | Status: AC
  Filled 2021-01-19: qty 2

## 2021-01-19 MED ORDER — SODIUM CHLORIDE FLUSH 0.9 % IV SOLN
INTRAVENOUS | Status: AC
  Filled 2021-01-19: qty 40

## 2021-01-19 MED ORDER — ENALAPRIL MALEATE 2.5 MG PO TABS
2.5000 mg | ORAL_TABLET | Freq: Every day | ORAL | Status: DC
  Administered 2021-01-19 – 2021-01-21 (×3): 2.5 mg via ORAL
  Filled 2021-01-19 (×3): qty 1

## 2021-01-19 MED ORDER — ACETAMINOPHEN 10 MG/ML IV SOLN
INTRAVENOUS | Status: AC
  Filled 2021-01-19: qty 100

## 2021-01-19 MED ORDER — TAMSULOSIN HCL 0.4 MG PO CAPS
0.4000 mg | ORAL_CAPSULE | Freq: Every day | ORAL | Status: DC
  Administered 2021-01-20 – 2021-01-21 (×2): 0.4 mg via ORAL
  Filled 2021-01-19 (×2): qty 1

## 2021-01-19 MED ORDER — LACTATED RINGERS IV SOLN: INTRAVENOUS | Status: DC | PRN

## 2021-01-19 MED ORDER — CITALOPRAM HYDROBROMIDE 20 MG PO TABS
20.0000 mg | ORAL_TABLET | Freq: Every day | ORAL | Status: DC
  Administered 2021-01-20 – 2021-01-21 (×2): 20 mg via ORAL
  Filled 2021-01-19 (×2): qty 1

## 2021-01-19 MED ORDER — CYCLOBENZAPRINE HCL 10 MG PO TABS
10.0000 mg | ORAL_TABLET | Freq: Three times a day (TID) | ORAL | Status: DC | PRN
  Administered 2021-01-19 – 2021-01-21 (×4): 10 mg via ORAL
  Filled 2021-01-19 (×4): qty 1

## 2021-01-19 MED ORDER — LORATADINE 10 MG PO TABS
10.0000 mg | ORAL_TABLET | Freq: Every day | ORAL | Status: DC
  Administered 2021-01-20 – 2021-01-21 (×2): 10 mg via ORAL
  Filled 2021-01-19 (×2): qty 1

## 2021-01-19 MED ORDER — MIDAZOLAM HCL 5 MG/5ML IJ SOLN
INTRAMUSCULAR | Status: DC | PRN
  Administered 2021-01-19: 2 mg via INTRAVENOUS

## 2021-01-19 MED ORDER — HYDROMORPHONE HCL 1 MG/ML IJ SOLN
0.5000 mg | INTRAMUSCULAR | Status: DC | PRN
  Administered 2021-01-19 – 2021-01-20 (×3): 1 mg via INTRAVENOUS
  Filled 2021-01-19 (×3): qty 1

## 2021-01-19 MED ORDER — BUPIVACAINE HCL (PF) 0.5 % IJ SOLN
INTRAMUSCULAR | Status: DC | PRN
  Administered 2021-01-19: 2.8 mL via INTRATHECAL

## 2021-01-19 MED ORDER — ALBUTEROL SULFATE HFA 108 (90 BASE) MCG/ACT IN AERS: 2.0000 | INHALATION_SPRAY | Freq: Four times a day (QID) | RESPIRATORY_TRACT | Status: DC | PRN

## 2021-01-19 MED ORDER — PROPOFOL 500 MG/50ML IV EMUL
INTRAVENOUS | Status: DC | PRN
  Administered 2021-01-19: 125 ug/kg/min via INTRAVENOUS

## 2021-01-19 MED ORDER — GLYCOPYRROLATE 0.2 MG/ML IJ SOLN
INTRAMUSCULAR | Status: DC | PRN
  Administered 2021-01-19: .2 mg via INTRAVENOUS
  Administered 2021-01-19: .1 mg via INTRAVENOUS

## 2021-01-19 MED ORDER — POLYETHYLENE GLYCOL 3350 17 G PO PACK
17.0000 g | PACK | Freq: Every day | ORAL | Status: DC | PRN
  Administered 2021-01-20: 17 g via ORAL
  Filled 2021-01-19: qty 1

## 2021-01-19 MED ORDER — BUPROPION HCL ER (SR) 150 MG PO TB12
150.0000 mg | ORAL_TABLET | Freq: Two times a day (BID) | ORAL | Status: DC
  Administered 2021-01-19 – 2021-01-21 (×4): 150 mg via ORAL
  Filled 2021-01-19 (×6): qty 1

## 2021-01-19 MED ORDER — ZOLPIDEM TARTRATE 5 MG PO TABS
5.0000 mg | ORAL_TABLET | Freq: Every evening | ORAL | Status: DC | PRN
  Administered 2021-01-19: 5 mg via ORAL
  Filled 2021-01-19: qty 1

## 2021-01-19 MED ORDER — PANTOPRAZOLE SODIUM 40 MG PO TBEC
80.0000 mg | DELAYED_RELEASE_TABLET | Freq: Every day | ORAL | Status: DC
  Administered 2021-01-20 – 2021-01-21 (×2): 80 mg via ORAL
  Filled 2021-01-19 (×2): qty 2

## 2021-01-19 MED ORDER — ONDANSETRON HCL 4 MG/2ML IJ SOLN
INTRAMUSCULAR | Status: DC | PRN
  Administered 2021-01-19: 4 mg via INTRAVENOUS

## 2021-01-19 MED ORDER — ALBUTEROL SULFATE (2.5 MG/3ML) 0.083% IN NEBU: 2.5000 mg | INHALATION_SOLUTION | Freq: Four times a day (QID) | RESPIRATORY_TRACT | Status: DC | PRN

## 2021-01-19 MED ORDER — CEFAZOLIN SODIUM-DEXTROSE 2-4 GM/100ML-% IV SOLN
2.0000 g | Freq: Four times a day (QID) | INTRAVENOUS | Status: AC
  Administered 2021-01-19 (×2): 2 g via INTRAVENOUS
  Filled 2021-01-19 (×2): qty 100

## 2021-01-19 MED ORDER — ACETAMINOPHEN 10 MG/ML IV SOLN
INTRAVENOUS | Status: DC | PRN
  Administered 2021-01-19: 1000 mg via INTRAVENOUS

## 2021-01-19 MED ORDER — ONDANSETRON HCL 4 MG/2ML IJ SOLN: 4.0000 mg | Freq: Four times a day (QID) | INTRAMUSCULAR | Status: DC | PRN

## 2021-01-19 MED ORDER — ACETAMINOPHEN 325 MG PO TABS
325.0000 mg | ORAL_TABLET | Freq: Four times a day (QID) | ORAL | Status: DC | PRN
  Administered 2021-01-20: 650 mg via ORAL
  Filled 2021-01-19: qty 2

## 2021-01-19 MED ORDER — MENTHOL 3 MG MT LOZG
1.0000 | LOZENGE | OROMUCOSAL | Status: DC | PRN
  Filled 2021-01-19: qty 9

## 2021-01-19 MED ORDER — ORAL CARE MOUTH RINSE: 15.0000 mL | Freq: Once | OROMUCOSAL | Status: AC

## 2021-01-19 MED ORDER — OXYCODONE HCL ER 10 MG PO T12A
20.0000 mg | EXTENDED_RELEASE_TABLET | Freq: Two times a day (BID) | ORAL | Status: DC
  Administered 2021-01-19 – 2021-01-21 (×4): 20 mg via ORAL
  Filled 2021-01-19 (×4): qty 2

## 2021-01-19 MED ORDER — EPHEDRINE SULFATE 50 MG/ML IJ SOLN
INTRAMUSCULAR | Status: DC | PRN
  Administered 2021-01-19 (×5): 10 mg via INTRAVENOUS
  Administered 2021-01-19: 15 mg via INTRAVENOUS

## 2021-01-19 MED ORDER — ONDANSETRON HCL 4 MG/2ML IJ SOLN: 4.0000 mg | Freq: Once | INTRAMUSCULAR | Status: DC | PRN

## 2021-01-19 MED ORDER — PROPOFOL 10 MG/ML IV BOLUS
INTRAVENOUS | Status: DC | PRN
  Administered 2021-01-19: 50 mg via INTRAVENOUS
  Administered 2021-01-19: 20 mg via INTRAVENOUS

## 2021-01-19 MED ORDER — CEFAZOLIN SODIUM-DEXTROSE 2-4 GM/100ML-% IV SOLN
INTRAVENOUS | Status: AC
  Filled 2021-01-19: qty 100

## 2021-01-19 MED ORDER — CHLORHEXIDINE GLUCONATE 0.12 % MT SOLN
OROMUCOSAL | Status: AC
  Administered 2021-01-19: 15 mL via OROMUCOSAL
  Filled 2021-01-19: qty 15

## 2021-01-19 MED ORDER — DOCUSATE SODIUM 100 MG PO CAPS
100.0000 mg | ORAL_CAPSULE | Freq: Two times a day (BID) | ORAL | Status: DC
  Administered 2021-01-19 – 2021-01-21 (×4): 100 mg via ORAL
  Filled 2021-01-19 (×4): qty 1

## 2021-01-19 MED ORDER — NITROGLYCERIN 0.4 MG SL SUBL
0.4000 mg | SUBLINGUAL_TABLET | SUBLINGUAL | Status: DC | PRN
Start: 2021-01-19 — End: 2021-01-21

## 2021-01-19 MED ORDER — OXYCODONE HCL 5 MG PO TABS
10.0000 mg | ORAL_TABLET | ORAL | Status: DC | PRN
  Administered 2021-01-19: 5 mg via ORAL
  Administered 2021-01-20 – 2021-01-21 (×7): 15 mg via ORAL
  Filled 2021-01-19 (×7): qty 3

## 2021-01-19 MED ORDER — SODIUM CHLORIDE 0.9 % IV SOLN
INTRAVENOUS | Status: DC | PRN
  Administered 2021-01-19: 50 ug/min via INTRAVENOUS

## 2021-01-19 MED ORDER — PROPOFOL 10 MG/ML IV BOLUS
INTRAVENOUS | Status: AC
  Filled 2021-01-19: qty 20

## 2021-01-19 MED ORDER — ONDANSETRON HCL 4 MG PO TABS
4.0000 mg | ORAL_TABLET | Freq: Four times a day (QID) | ORAL | Status: DC | PRN
  Administered 2021-01-21 (×2): 4 mg via ORAL
  Filled 2021-01-19 (×2): qty 1

## 2021-01-19 MED ORDER — BUPIVACAINE-EPINEPHRINE (PF) 0.25% -1:200000 IJ SOLN
INTRAMUSCULAR | Status: DC | PRN
  Administered 2021-01-19: 30 mL

## 2021-01-19 MED ORDER — DULOXETINE HCL 60 MG PO CPEP
60.0000 mg | ORAL_CAPSULE | Freq: Every day | ORAL | Status: DC
  Administered 2021-01-20 – 2021-01-21 (×2): 60 mg via ORAL
  Filled 2021-01-19 (×2): qty 1

## 2021-01-19 MED ORDER — ALUM & MAG HYDROXIDE-SIMETH 200-200-20 MG/5ML PO SUSP: 30.0000 mL | ORAL | Status: DC | PRN

## 2021-01-19 MED ORDER — BUPIVACAINE-EPINEPHRINE (PF) 0.25% -1:200000 IJ SOLN
INTRAMUSCULAR | Status: AC
  Filled 2021-01-19: qty 30

## 2021-01-19 MED ORDER — PHENOL 1.4 % MT LIQD
1.0000 | OROMUCOSAL | Status: DC | PRN
  Filled 2021-01-19: qty 177

## 2021-01-19 SURGICAL SUPPLY — 76 items
APL PRP STRL LF DISP 70% ISPRP (MISCELLANEOUS) ×2
BLADE SAGITTAL 25.0X1.19X90 (BLADE) ×2 IMPLANT
BLADE SAW 90X13X1.19 OSCILLAT (BLADE) ×2 IMPLANT
BLOCK CUTTING FEMUR SZ7 RT (MISCELLANEOUS) ×1 IMPLANT
BLOCK CUTTING TIBIAL 4 RT MIS (MISCELLANEOUS) ×1 IMPLANT
BLOCK CUTTING TIBIAL 6 RT (MISCELLANEOUS) ×1 IMPLANT
BNDG ELASTIC 6X5.8 VLCR STR LF (GAUZE/BANDAGES/DRESSINGS) ×2 IMPLANT
CANISTER SUCT 1200ML W/VALVE (MISCELLANEOUS) ×2 IMPLANT
CANISTER WOUND CARE 500ML ATS (WOUND CARE) ×2 IMPLANT
CEMENT HV SMART SET (Cement) ×4 IMPLANT
CHLORAPREP W/TINT 26 (MISCELLANEOUS) ×4 IMPLANT
COOLER POLAR GLACIER W/PUMP (MISCELLANEOUS) ×2 IMPLANT
COVER WAND RF STERILE (DRAPES) ×2 IMPLANT
CUFF TOURN SGL QUICK 24 (TOURNIQUET CUFF)
CUFF TOURN SGL QUICK 34 (TOURNIQUET CUFF)
CUFF TRNQT CYL 24X4X16.5-23 (TOURNIQUET CUFF) IMPLANT
CUFF TRNQT CYL 34X4.125X (TOURNIQUET CUFF) IMPLANT
DRAPE 3/4 80X56 (DRAPES) ×4 IMPLANT
DRSG MEPILEX SACRM 8.7X9.8 (GAUZE/BANDAGES/DRESSINGS) ×2 IMPLANT
ELECT CAUTERY BLADE 6.4 (BLADE) ×2 IMPLANT
ELECT REM PT RETURN 9FT ADLT (ELECTROSURGICAL) ×2
ELECTRODE REM PT RTRN 9FT ADLT (ELECTROSURGICAL) ×1 IMPLANT
FEMORAL COMPONENT SZ7 RT (Femur) ×1 IMPLANT
FEMUR BONE MODEL (MISCELLANEOUS) ×1 IMPLANT
GAUZE 4X4 16PLY ~~LOC~~+RFID DBL (SPONGE) ×2 IMPLANT
GAUZE SPONGE 4X4 12PLY STRL (GAUZE/BANDAGES/DRESSINGS) ×2 IMPLANT
GAUZE XEROFORM 1X8 LF (GAUZE/BANDAGES/DRESSINGS) ×2 IMPLANT
GLOVE SURG ORTHO LTX SZ8 (GLOVE) ×2 IMPLANT
GLOVE SURG SYN 9.0  PF PI (GLOVE) ×2
GLOVE SURG SYN 9.0 PF PI (GLOVE) ×1 IMPLANT
GLOVE SURG UNDER LTX SZ8 (GLOVE) ×2 IMPLANT
GLOVE SURG UNDER POLY LF SZ9 (GLOVE) ×2 IMPLANT
GOWN SRG 2XL LVL 4 RGLN SLV (GOWNS) ×1 IMPLANT
GOWN STRL NON-REIN 2XL LVL4 (GOWNS) ×2
GOWN STRL REUS W/ TWL LRG LVL3 (GOWN DISPOSABLE) ×1 IMPLANT
GOWN STRL REUS W/ TWL XL LVL3 (GOWN DISPOSABLE) ×1 IMPLANT
GOWN STRL REUS W/TWL LRG LVL3 (GOWN DISPOSABLE) ×2
GOWN STRL REUS W/TWL XL LVL3 (GOWN DISPOSABLE) ×2
HOLDER FOLEY CATH W/STRAP (MISCELLANEOUS) ×2 IMPLANT
HOOD PEEL AWAY FLYTE STAYCOOL (MISCELLANEOUS) ×4 IMPLANT
IRRIGATION SURGIPHOR STRL (IV SOLUTION) IMPLANT
IV NS IRRIG 3000ML ARTHROMATIC (IV SOLUTION) ×2 IMPLANT
KIT PREVENA INCISION MGT20CM45 (CANNISTER) ×2 IMPLANT
KIT TURNOVER KIT A (KITS) ×2 IMPLANT
MANIFOLD NEPTUNE II (INSTRUMENTS) ×2 IMPLANT
NDL SAFETY ECLIPSE 18X1.5 (NEEDLE) ×1 IMPLANT
NDL SPNL 18GX3.5 QUINCKE PK (NEEDLE) ×1 IMPLANT
NDL SPNL 20GX3.5 QUINCKE YW (NEEDLE) ×1 IMPLANT
NEEDLE HYPO 18GX1.5 SHARP (NEEDLE) ×2
NEEDLE SPNL 18GX3.5 QUINCKE PK (NEEDLE) ×2 IMPLANT
NEEDLE SPNL 20GX3.5 QUINCKE YW (NEEDLE) ×2 IMPLANT
NS IRRIG 1000ML POUR BTL (IV SOLUTION) ×2 IMPLANT
PACK TOTAL KNEE (MISCELLANEOUS) ×2 IMPLANT
PAD WRAPON POLAR KNEE (MISCELLANEOUS) ×1 IMPLANT
PATELLA RESURFACING MEDACTA SZ (Bone Implant) ×1 IMPLANT
PENCIL SMOKE EVACUATOR COATED (MISCELLANEOUS) ×2 IMPLANT
PULSAVAC PLUS IRRIG FAN TIP (DISPOSABLE) ×2
SCALPEL PROTECTED #10 DISP (BLADE) ×4 IMPLANT
SPONGE T-LAP 18X18 ~~LOC~~+RFID (SPONGE) ×6 IMPLANT
STAPLER SKIN PROX 35W (STAPLE) ×2 IMPLANT
STEM EXTENSION 11MMX30MM (Stem) ×1 IMPLANT
SUCTION FRAZIER HANDLE 10FR (MISCELLANEOUS) ×2
SUCTION TUBE FRAZIER 10FR DISP (MISCELLANEOUS) ×1 IMPLANT
SUT DVC 2 QUILL PDO  T11 36X36 (SUTURE) ×2
SUT DVC 2 QUILL PDO T11 36X36 (SUTURE) ×1 IMPLANT
SUT ETHIBOND 2 V 37 (SUTURE) IMPLANT
SUT V-LOC 90 ABS DVC 3-0 CL (SUTURE) ×2 IMPLANT
SYR 20ML LL LF (SYRINGE) ×2 IMPLANT
SYR 50ML LL SCALE MARK (SYRINGE) ×4 IMPLANT
TIBIAL INSERT FIXED SZ5 RIGHT (Insert) ×1 IMPLANT
TIBIAL TRAY FIXED (Bone Implant) ×1 IMPLANT
TIP FAN IRRIG PULSAVAC PLUS (DISPOSABLE) ×1 IMPLANT
TOWEL OR 17X26 4PK STRL BLUE (TOWEL DISPOSABLE) ×2 IMPLANT
TOWER CARTRIDGE SMART MIX (DISPOSABLE) ×2 IMPLANT
TRAY FOLEY MTR SLVR 16FR STAT (SET/KITS/TRAYS/PACK) ×2 IMPLANT
WRAPON POLAR PAD KNEE (MISCELLANEOUS) ×2

## 2021-01-19 NOTE — Transfer of Care (Signed)
Immediate Anesthesia Transfer of Care Note  Patient: Keith Gibbs  Procedure(s) Performed: TOTAL KNEE ARTHROPLASTY - Rachelle Hora to Assist (Right: Knee)  Patient Location: PACU  Anesthesia Type:General  Level of Consciousness: sedated  Airway & Oxygen Therapy: Patient Spontanous Breathing and Patient connected to face mask oxygen  Post-op Assessment: Report given to RN and Post -op Vital signs reviewed and stable  Post vital signs: Reviewed and stable  Last Vitals:  Vitals Value Taken Time  BP 99/67 01/19/21 1330  Temp 36.4 C 01/19/21 1241  Pulse 49 01/19/21 1338  Resp 14 01/19/21 1338  SpO2 99 % 01/19/21 1338  Vitals shown include unvalidated device data.  Last Pain:  Vitals:   01/19/21 1330  TempSrc:   PainSc: 0-No pain         Complications: No notable events documented.

## 2021-01-19 NOTE — Anesthesia Preprocedure Evaluation (Signed)
Anesthesia Evaluation  Patient identified by MRN, date of birth, ID band Patient awake    Reviewed: Allergy & Precautions, NPO status , Patient's Chart, lab work & pertinent test results  History of Anesthesia Complications Negative for: history of anesthetic complications  Airway Mallampati: II  TM Distance: >3 FB Neck ROM: Full    Dental  (+) Edentulous Lower, Edentulous Upper   Pulmonary COPD,  COPD inhaler, Current Smoker and Patient abstained from smoking.,    breath sounds clear to auscultation- rhonchi (-) wheezing      Cardiovascular hypertension, Pt. on medications (-) angina+ CAD, + Past MI and + Cardiac Stents  (-) CABG  Rhythm:Regular Rate:Normal - Systolic murmurs and - Diastolic murmurs    Neuro/Psych neg Seizures PSYCHIATRIC DISORDERS Anxiety Depression Bipolar Disorder Schizophrenia CVA, No Residual Symptoms    GI/Hepatic PUD, GERD  ,(+) Hepatitis -, C  Endo/Other  negative endocrine ROSneg diabetes  Renal/GU negative Renal ROS     Musculoskeletal  (+) Arthritis ,   Abdominal (+) - obese,   Peds  Hematology negative hematology ROS (+)   Anesthesia Other Findings Past Medical History: No date: Anginal pain ([place]) No date: Anxiety No date: Arthritis No date: Bipolar 1 disorder (HCC) No date: Bursitis No date: CAD (coronary artery disease) No date: Chronic pain No date: COPD (chronic obstructive pulmonary disease) (HCC) No date: Current use of long term anticoagulation     Comment:  DAPT (ASA + clopidogrel) No date: Depression No date: Diverticulitis No date: Dyspnea No date: GERD (gastroesophageal reflux disease) No date: Grade I diastolic dysfunction 9509: Hepatitis C     Comment:  No longer has Hep C No date: HLD (hyperlipidemia) No date: Hypertension No date: MI (myocardial infarction) ([place]) No date: Polysubstance abuse ([place])     Comment:  cocaine, marijuana, ETOH No date: PUD (peptic  ulcer disease) 06/10/2016: S/P angioplasty with stent     Comment:  a.) 90% stenosis of pLAD to mLAD - 2.5 x 18 mm Xience               Alpine (DES x 1) placed to pLAD 12/04/2019: S/P PTCA (percutaneous transluminal coronary angioplasty)     Comment:  a.) 60% in stent restenosis of DES to pLAD; LVEF 65%. No date: Schizophrenia ([place]) No date: Stroke [facility]) No date: Valvular insufficiency     Comment:  a.) Mild MR, TR, PR; mild to moderate AR on 03/05/2018               TTE   Reproductive/Obstetrics                             Lab Results  Component Value Date   WBC 6.9 01/05/2021   HGB 13.8 01/05/2021   HCT 39.7 01/05/2021   MCV 93.4 01/05/2021   PLT 181 01/05/2021    Anesthesia Physical Anesthesia Plan  ASA: 3  Anesthesia Plan: Spinal   Post-op Pain Management:    Induction:   PONV Risk Score and Plan: 0 and Propofol infusion  Airway Management Planned: Natural Airway  Additional Equipment:   Intra-op Plan:   Post-operative Plan:   Informed Consent: I have reviewed the patients History and Physical, chart, labs and discussed the procedure including the risks, benefits and alternatives for the proposed anesthesia with the patient or authorized representative who has indicated his/her understanding and acceptance.     Dental advisory given  Plan Discussed with: CRNA and  Anesthesiologist  Anesthesia Plan Comments:         Anesthesia Quick Evaluation

## 2021-01-19 NOTE — H&P (Signed)
Chief Complaint  Patient presents with   Right Knee - Pain    History of the Present Illness: Keith Gibbs is a 60 y.o. male here today.   The patient presents for H and P of right knee to discuss right total knee arthroplasty. He had x-rays back on 11/03/2018 showing significant tricompartmental osteoarthritis with significant joint space loss, medial, lateral, and patellofemoral joint spaces, slight varus deformity with osteophytes, medial and lateral, as well as large posterior osteophytes and patellofemoral degenerative changes, medial and lateral facet arthritis without any subluxation. Currently, he is scheduled for total knee arthroplasty. He comes in to discuss surgery at length and resolve any questions.  The patient notes he is scheduled for surgery on 01/14/2021. He states his right knee is swollen and feels crooked. He has pain to the posterior aspect of his right knee as well. He tries to stay off of his right knee, and uses ice packs. He has had arthroscopic surgery on his right knee in the past.  The patient takes baby aspirin. He has diabetes. He has dentures.  The patient lives in [place]. He enjoys playing sports.  I have reviewed past medical, surgical, social and family history, and allergies as documented in the EMR.  Past Medical History: Past Medical History:  Diagnosis Date   Abnormal ejaculation   Acute schizophrenia (CMS-HCC)   Angina at rest (CMS-HCC)  06/11/16   Anxiety attack   Arthritis   Bipolar 1 disorder (CMS-HCC) 05/27/2013   Bursitis   Cocaine abuse in remission (CMS-HCC)   COPD (chronic obstructive pulmonary disease) (CMS-HCC)   Depression   GERD (gastroesophageal reflux disease)   Hepatitis C 2012   Myalgia   Osteoarthritis   Past Surgical History: Past Surgical History:  Procedure Laterality Date   abdominal surgery  intestines   APPENDECTOMY   BACK SURGERY   cardiac catheterization Left   knee arthroscopy with medial and  lateral menisectomy partial synovectomy Right 09/05/2017  Dr.Adyson Vanburen   shoulder surgery 04/09/2012   Past Family History: Family History  Problem Relation Age of Onset   Osteoarthritis Mother   Heart disease Mother   High blood pressure (Hypertension) Mother   Heart disease Father   Early death Father   High blood pressure (Hypertension) Father   High blood pressure (Hypertension) Sister   Medications: Current Outpatient Medications Ordered in Epic  Medication Sig Dispense Refill   albuterol 90 mcg/actuation inhaler Frequency:PHARMDIR Dosage:90 MCG Instructions: Note:2 puffs q 6 prn SOB Dose: 90 MCG   aspirin 81 MG EC tablet Take 1 tablet by mouth once daily   atorvastatin (LIPITOR) 40 MG tablet Take 1 tablet by mouth every 6 (six) hours as needed.   BREZTRI AEROSPHERE 160-9-4.8 mcg/actuation inhaler Inhale 2 inhalations into the lungs 2 (two) times daily   buPROPion (WELLBUTRIN SR) 150 MG SR tablet Take 1 tablet by mouth 2 (two) times daily   cetirizine (ZYRTEC) 10 mg capsule Take 10 mg by mouth once daily   citalopram (CELEXA) 20 MG tablet Take 1 tablet by mouth once daily   clopidogreL (PLAVIX) 75 mg tablet Take 1 tablet by mouth once daily   cyclobenzaprine (FLEXERIL) 10 MG tablet Take 1 tablet by mouth nightly   DULoxetine (CYMBALTA) 60 MG DR capsule Take by mouth   enalapril (VASOTEC) 2.5 MG tablet Take 1 tablet by mouth once daily   gabapentin (NEURONTIN) 300 MG capsule TAKE (1) CAPSULE BY MOUTH TWICE DAILY.   hydrOXYzine (ATARAX) 25 MG tablet TAKE 1  TABLET BY MOUTH 3 TIMES DAILY AS NEEDED.   isosorbide mononitrate (IMDUR) 30 MG ER tablet Take 1 tablet by mouth once daily.   mometasone-formoterol (DULERA) 200-5 mcg/actuation inhaler TAKE 2 PUFFS BY MOUTH TWICE A DAY   nitroGLYcerin (NITROSTAT) 0.4 MG SL tablet Place 1 tablet under the tongue as needed   omeprazole (PRILOSEC) 20 MG DR capsule Take 1 capsule by mouth once daily   rOPINIRole (REQUIP) 0.5 MG tablet Take 1 tablet by  mouth 2 (two) times daily   tamsulosin (FLOMAX) 0.4 mg capsule Take 1 capsule by mouth once daily   ticagrelor (BRILINTA) 90 mg tablet Take 1 tablet by mouth 2 (two) times daily   traMADoL (ULTRAM) 50 mg tablet Take 2 tablets (100 mg total) by mouth every 6 (six) hours as needed for Pain 40 tablet 2   traZODone (DESYREL) 100 MG tablet   No current Epic-ordered facility-administered medications on file.   Allergies: Allergies  Allergen Reactions   Asenapine Other (See Comments)  Increased tremors   Asenapine Maleate Unknown   Lurasidone Unknown   Lurasidone Hcl Other (See Comments)  tremor   Naproxen Hives and Swelling   Tramadol Rash    Body mass index is 26.77 kg/m.  Review of Systems: A comprehensive 14 point ROS was performed, reviewed, and the pertinent orthopaedic findings are documented in the HPI.  Vitals:  12/23/20 0955  BP: 118/66    General Physical Examination:   General/Constitutional: No apparent distress: well-nourished and well developed. Eyes: Pupils equal, round with synchronous movement. Lungs: Clear to auscultation HEENT: Normal Edentulous Vascular: No edema, swelling or tenderness, except as noted in detailed exam. Cardiac: Heart rate and rhythm is regular. Integumentary: No impressive skin lesions present, except as noted in detailed exam. Neuro/Psych: Normal mood and affect, oriented to person, place and time.  Musculoskeletal Examination:  On exam, right knee has range of motion of 5-100 degrees. Right hip has good range of motion without pain. Strong dorsalis pedis and posterior tibial pulses. The patient has dentures.  Radiographs:  No new imaging studies were obtained today.  Reviewed x-rays obtained on 11/03/2018.  Assessment: ICD-10-CM  1. Primary osteoarthritis of right knee M17.11   Plan:  The patient has clinical findings of significant tricompartmental osteoarthritis of the right knee.  We discussed the patient's prior x-ray  findings. The patient is scheduled for right total knee arthroplasty on 01/14/2021. I explained the surgery and postoperative course in detail. We discussed he would massage his scar once his skin is completely healed. I advised him to apply sunblock to his scar when he is outside.  Surgical Risks:  The nature of the condition and the proposed procedure has been reviewed in detail with the patient. Surgical versus non-surgical options and prognosis for recovery have been reviewed and the inherent risks and benefits of each have been discussed including the risks of infection, bleeding, injury to nerves/blood vessels/tendons, incomplete relief of symptoms, persisting pain and/or stiffness, loss of function, complex regional pain syndrome, failure of the procedure, as appropriate.  Teeth: Edentulous  Attestation: I, Dawn Royse, am documenting for [facility], MD utilizing [place].    Electronically signed by Lauris Poag, MD at 12/23/2020 10:08 PM EDT  Reviewed  H+P. No changes noted.

## 2021-01-19 NOTE — Anesthesia Procedure Notes (Signed)
Spinal  Patient location during procedure: OR Start time: 01/19/2021 10:05 AM End time: 01/19/2021 10:16 AM Reason for block: surgical anesthesia Staffing Performed: resident/CRNA  Anesthesiologist: Emmie Niemann, MD Resident/CRNA: Justus Memory, CRNA Preanesthetic Checklist Completed: patient identified, IV checked, site marked, risks and benefits discussed, surgical consent, monitors and equipment checked, pre-op evaluation and timeout performed Spinal Block Patient position: sitting Prep: ChloraPrep Patient monitoring: heart rate, continuous pulse ox and blood pressure Approach: midline Location: L4-5 Injection technique: single-shot Needle Needle type: Introducer and Pencil-Tip  Needle gauge: 24 G Needle length: 9 cm Assessment Events: CSF return Additional Notes Negative paresthesia. Negative blood return. Positive free-flowing CSF. Expiration date of kit checked and confirmed. Patient tolerated procedure well, without complications.

## 2021-01-19 NOTE — Op Note (Signed)
01/19/2021  12:38 PM  PATIENT:  Keith Gibbs   MRN: [id number]  PRE-OPERATIVE DIAGNOSIS:  Primary localized osteoarthritis of right knee   POST-OPERATIVE DIAGNOSIS:  Same   PROCEDURE:  Procedure(s): Right TOTAL KNEE ARTHROPLASTY   SURGEON: Laurene Footman, MD   ASSISTANTS: Rachelle Hora, PA-C   ANESTHESIA:   spinal   EBL:  200   BLOOD ADMINISTERED:none   DRAINS:  Incisional wound VAC     LOCAL MEDICATIONS USED:  MARCAINE    and OTHER morphine and Exparel   SPECIMEN:  No Specimen   DISPOSITION OF SPECIMEN:  N/A   COUNTS:  YES   TOURNIQUET: 80 minutes at 300 mm Hg   IMPLANTS: Medacta  GMK sphere system with 7 right femur, 5 right tibia with short stem and 10 mm insert.  Size 3 patella, all components cemented.   DICTATION: Viviann Spare Dictation   patient was brought to the operating room and spinal anesthesia was obtained.  After prepping and draping the right leg in sterile fashion, and after patient identification and timeout procedures were completed, tourniquet was raised  and midline skin incision was made followed by medial parapatellar arthrotomy with severe medial compartment osteoarthritis, severe patellofemoral arthritis and severe lateral compartment arthritis, partial synovectomy was also carried out.   The ACL and PCL and fat pad were excised along with anterior horns of the meniscus. The proximal tibia cutting guide from  the [facility] system was applied and the proximal tibia cut carried out.  The distal femoral cut was carried out in a similar fashion     The 7 femoral cutting guide applied with anterior posterior and chamfer cuts made.  The posterior horns of the menisci were removed at this point.   Injection of the above medication was carried out after the femoral and tibial cuts were carried out.  The initially 6 and subsequently 5 baseplate trial was placed pinned into position and proximal tibial preparation carried out with drilling hand reaming and the keel punch  followed by placement of the 7 femur and sizing the tibial insert size 10 millimeter gave the best fit with stability and full extension.  The distal femoral drill holes were made in the notch cut for the trochlear groove was then carried out with trials were then removed the patella was cut using the patellar cutting guide and it sized to a size 3 after drill holes have been made  The knee was irrigated with pulsatile lavage and the bony surfaces dried the tibial component was cemented into place first.  Excess cement was removed and the polyethylene insert placed with a torque screw placed with a torque screwdriver tightened.  The distal femoral component was placed and the knee was held in extension as the patellar button was clamped into place.  After the cement was set, excess cement was removed and the knee was again irrigated thoroughly thoroughly irrigated.  The tourniquet was let down and hemostasis checked with electrocautery. The arthrotomy was repaired with a heavy Quill suture,  followed by 3-0 V lock subcuticular closure, skin staples followed by incisional wound VAC and Polar Care.Marland Kitchen   PLAN OF CARE: Admit for overnight observation   PATIENT DISPOSITION:  PACU - hemodynamically stable.

## 2021-01-19 NOTE — Progress Notes (Signed)
Pt ambulated to bathroom x 2 with this RN's  assist. Pt ambulating with steady gait.

## 2021-01-19 NOTE — Progress Notes (Signed)
Pt admitted to room 152 following R TKA by Dr. Rudene Christians. VSS. Pt resting comfortably in bed. Meal tray ordered. Pt oriented to room and equipment  01/19/21 1410  Vitals  Temp 97.6 F (36.4 C)  BP 118/65  MAP (mmHg) 81  BP Location Right Arm  BP Method Automatic  Patient Position (if appropriate) Lying  Pulse Rate (!) 49  Pulse Rate Source Monitor  Resp 15  Level of Consciousness  Level of Consciousness Alert  MEWS COLOR  MEWS Score Color Green  Oxygen Therapy  SpO2 100 %  O2 Device Room Air  . All questions and concerns addressed at this time.

## 2021-01-19 NOTE — Plan of Care (Signed)
?  Problem: Education: ?Goal: Knowledge of the prescribed therapeutic regimen will improve ?Outcome: Progressing ?Goal: Individualized Educational Video(s) ?Outcome: Progressing ?  ?Problem: Activity: ?Goal: Ability to avoid complications of mobility impairment will improve ?Outcome: Progressing ?Goal: Range of joint motion will improve ?Outcome: Progressing ?  ?Problem: Clinical Measurements: ?Goal: Postoperative complications will be avoided or minimized ?Outcome: Progressing ?  ?Problem: Pain Management: ?Goal: Pain level will decrease with appropriate interventions ?Outcome: Progressing ?  ?Problem: Skin Integrity: ?Goal: Will show signs of wound healing ?Outcome: Progressing ?  ?

## 2021-01-20 ENCOUNTER — Encounter: Payer: Self-pay | Admitting: Orthopedic Surgery

## 2021-01-20 DIAGNOSIS — I2510 Atherosclerotic heart disease of native coronary artery without angina pectoris: Secondary | ICD-10-CM | POA: Diagnosis present

## 2021-01-20 DIAGNOSIS — Z79899 Other long term (current) drug therapy: Secondary | ICD-10-CM | POA: Diagnosis not present

## 2021-01-20 DIAGNOSIS — Z7982 Long term (current) use of aspirin: Secondary | ICD-10-CM | POA: Diagnosis not present

## 2021-01-20 DIAGNOSIS — I10 Essential (primary) hypertension: Secondary | ICD-10-CM | POA: Diagnosis present

## 2021-01-20 DIAGNOSIS — F411 Generalized anxiety disorder: Secondary | ICD-10-CM | POA: Diagnosis present

## 2021-01-20 DIAGNOSIS — M1711 Unilateral primary osteoarthritis, right knee: Secondary | ICD-10-CM | POA: Diagnosis present

## 2021-01-20 DIAGNOSIS — I252 Old myocardial infarction: Secondary | ICD-10-CM | POA: Diagnosis not present

## 2021-01-20 DIAGNOSIS — F17210 Nicotine dependence, cigarettes, uncomplicated: Secondary | ICD-10-CM | POA: Diagnosis present

## 2021-01-20 DIAGNOSIS — F1411 Cocaine abuse, in remission: Secondary | ICD-10-CM | POA: Diagnosis present

## 2021-01-20 DIAGNOSIS — Z8711 Personal history of peptic ulcer disease: Secondary | ICD-10-CM | POA: Diagnosis not present

## 2021-01-20 DIAGNOSIS — F419 Anxiety disorder, unspecified: Secondary | ICD-10-CM | POA: Diagnosis present

## 2021-01-20 DIAGNOSIS — F32A Depression, unspecified: Secondary | ICD-10-CM | POA: Diagnosis present

## 2021-01-20 DIAGNOSIS — M19011 Primary osteoarthritis, right shoulder: Secondary | ICD-10-CM | POA: Diagnosis present

## 2021-01-20 DIAGNOSIS — Z8249 Family history of ischemic heart disease and other diseases of the circulatory system: Secondary | ICD-10-CM | POA: Diagnosis not present

## 2021-01-20 DIAGNOSIS — Z79891 Long term (current) use of opiate analgesic: Secondary | ICD-10-CM | POA: Diagnosis not present

## 2021-01-20 DIAGNOSIS — M170 Bilateral primary osteoarthritis of knee: Secondary | ICD-10-CM | POA: Diagnosis present

## 2021-01-20 DIAGNOSIS — Z9861 Coronary angioplasty status: Secondary | ICD-10-CM | POA: Diagnosis not present

## 2021-01-20 DIAGNOSIS — E119 Type 2 diabetes mellitus without complications: Secondary | ICD-10-CM | POA: Diagnosis present

## 2021-01-20 DIAGNOSIS — Z7901 Long term (current) use of anticoagulants: Secondary | ICD-10-CM | POA: Diagnosis not present

## 2021-01-20 DIAGNOSIS — F23 Brief psychotic disorder: Secondary | ICD-10-CM | POA: Diagnosis present

## 2021-01-20 DIAGNOSIS — K219 Gastro-esophageal reflux disease without esophagitis: Secondary | ICD-10-CM | POA: Diagnosis present

## 2021-01-20 DIAGNOSIS — J449 Chronic obstructive pulmonary disease, unspecified: Secondary | ICD-10-CM | POA: Diagnosis present

## 2021-01-20 DIAGNOSIS — Z8673 Personal history of transient ischemic attack (TIA), and cerebral infarction without residual deficits: Secondary | ICD-10-CM | POA: Diagnosis not present

## 2021-01-20 DIAGNOSIS — E7800 Pure hypercholesterolemia, unspecified: Secondary | ICD-10-CM | POA: Diagnosis present

## 2021-01-20 DIAGNOSIS — E785 Hyperlipidemia, unspecified: Secondary | ICD-10-CM | POA: Diagnosis present

## 2021-01-20 LAB — CBC
HCT: 37.1 % — ABNORMAL LOW (ref 39.0–52.0)
Hemoglobin: 12.9 g/dL — ABNORMAL LOW (ref 13.0–17.0)
MCH: 32.1 pg (ref 26.0–34.0)
MCHC: 34.8 g/dL (ref 30.0–36.0)
MCV: 92.3 fL (ref 80.0–100.0)
Platelets: 174 10*3/uL (ref 150–400)
RBC: 4.02 MIL/uL — ABNORMAL LOW (ref 4.22–5.81)
RDW: 13.1 % (ref 11.5–15.5)
WBC: 8.6 10*3/uL (ref 4.0–10.5)
nRBC: 0 % (ref 0.0–0.2)

## 2021-01-20 LAB — BASIC METABOLIC PANEL
Anion gap: 9 (ref 5–15)
BUN: 7 mg/dL (ref 6–20)
CO2: 27 mmol/L (ref 22–32)
Calcium: 8.8 mg/dL — ABNORMAL LOW (ref 8.9–10.3)
Chloride: 100 mmol/L (ref 98–111)
Creatinine, Ser: 0.83 mg/dL (ref 0.61–1.24)
GFR, Estimated: >60 mL/min (ref >60)
Glucose, Bld: 137 mg/dL — ABNORMAL HIGH (ref 70–99)
Potassium: 4.1 mmol/L (ref 3.5–5.1)
Sodium: 136 mmol/L (ref 135–145)

## 2021-01-20 MED ORDER — ACETAMINOPHEN 500 MG PO TABS: 1000.0000 mg | ORAL_TABLET | Freq: Four times a day (QID) | ORAL | 0 refills | Status: DC

## 2021-01-20 MED ORDER — DOCUSATE SODIUM 100 MG PO CAPS: 100.0000 mg | ORAL_CAPSULE | Freq: Two times a day (BID) | ORAL | 0 refills | Status: DC

## 2021-01-20 MED ORDER — OXYCODONE HCL 10 MG PO TABS: 10.0000 mg | ORAL_TABLET | ORAL | 0 refills | Status: DC | PRN

## 2021-01-20 MED ORDER — TRAMADOL HCL 50 MG PO TABS: 50.0000 mg | ORAL_TABLET | Freq: Four times a day (QID) | ORAL | 0 refills | Status: DC | PRN

## 2021-01-20 MED ORDER — OXYCODONE HCL ER 20 MG PO T12A: 20.0000 mg | EXTENDED_RELEASE_TABLET | Freq: Two times a day (BID) | ORAL | 0 refills | Status: DC

## 2021-01-20 NOTE — Discharge Summary (Signed)
Physician Discharge Summary  Patient ID: Keith Gibbs MRN: [id number] DOB/AGE: 10-24-1960 60 y.o.  Admit date: 01/19/2021 Discharge date: 01/21/2021  Admission Diagnoses:  S/P TKR (total knee replacement) using cement, right [Z96.651]   Discharge Diagnoses: Patient Active Problem List   Diagnosis Date Noted   S/P TKR (total knee replacement) using cement, right 01/19/2021   Cigarette smoker 07/21/2020   Foot pain, bilateral 12/25/2019   Potential exposure to STD 12/12/2019   History of lumbar surgery 12/12/2019   Fatigue 12/12/2019   Penile lesion 12/12/2019   Right testicular pain 12/12/2019   Body mass index 28.0-28.9, adult 12/12/2019   History of COPD 12/12/2019   Tobacco use disorder, continuous 12/12/2019   Hepatoma ([place]) 11/05/2019   Testicular discomfort 07/12/2019   Anxiety 12/10/2018   Unstable angina ([place]) 11/30/2018   Essential hypertension 09/18/2018   Heart valve disorder 09/18/2018   Hypercholesteremia 09/18/2018   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   Chronic anticoagulation (Plavix) 04/19/2018   Pain medication agreement broken ([place]) 04/19/2018   Cocaine use 04/18/2018   Cocaine abuse ([place]) (See 04/12/18 UDS) 04/18/2018   Marijuana abuse (See 04/12/18 UDS) 04/18/2018   Long term current use of opiate analgesic 04/12/2018   Cirrhosis of liver ([place]) 04/03/2018   BPH (benign prostatic hyperplasia) 04/03/2018   Hematuria, microscopic 04/03/2018   Osteoarthritis of the knee (Left) 11/02/2017   Lumbar facet syndrome (Bilateral) 11/01/2017   Spondylosis without myelopathy or radiculopathy, lumbosacral region 11/01/2017   Osteoarthritis of shoulder (Right) 11/01/2017   Osteoarthritis of acromioclavicular joint (Right) 11/01/2017   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) 11/01/2017   Osteoarthritis 11/01/2017   Cervicalgia 11/01/2017   Cervical facet syndrome 11/01/2017   DDD (degenerative disc disease), cervical 11/01/2017   DDD  (degenerative disc disease), lumbar 11/01/2017   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) 09/18/2017   Other chronic pain 09/18/2017   Chronic musculoskeletal pain 09/18/2017   Chronic pain of right knee 09/11/2017   Chronic low back pain (Secondary Area of Pain) (Bilateral) 09/11/2017   Chronic hip pain (Tertiary Area of Pain) (Bilateral) (R>L) 09/11/2017   Chronic shoulder pain (Fourth Area of Pain) (Right) 09/11/2017   Spondylosis without myelopathy or radiculopathy, cervical region 09/11/2017   Chronic pain syndrome 09/11/2017   Opiate use 09/11/2017   Screen for STD (sexually transmitted disease) 09/11/2017   Disorder of skeletal system 09/11/2017   Problems influencing health status 09/11/2017   Overdose of medication, intentional self-harm, sequela ([place]) 12/28/2016   Tobacco use disorder 12/28/2016   Severe recurrent major depression without psychotic features ([place]) 12/27/2016   Coronary artery disease of native artery of native heart with stable angina pectoris ([place]) 12/27/2016   Alcohol use disorder, moderate, dependence ([place]) 12/27/2016   Cannabis use disorder, moderate, dependence ([place]) 12/27/2016   Angina at rest [facility]) 06/11/2016   Chest pain 06/10/2016   Bipolar 1 disorder ([place]) 05/27/2013   Abnormal ejaculation 11/30/2012   Chest pain, unspecified 11/30/2012   Degenerative arthritis of hip 11/16/2012   Schizoaffective disorder ([place]) 06/27/2012   COPD GOLD ? / still smoking  06/27/2012   Chronic hepatitis C ([place]) 06/27/2012   GERD (gastroesophageal reflux disease) 06/27/2012   Complete rupture of rotator cuff 02/10/2012   Cocaine abuse in remission ([place]) 12/05/2011   Unspecified osteoarthritis, unspecified site 07/27/2011    Past Medical History:  Diagnosis Date   Anginal pain ([place])    Anxiety    Arthritis    Bipolar 1 disorder ([place])    Bursitis  CAD (coronary artery disease)    Chronic pain    COPD (chronic obstructive pulmonary disease) (HCC)    Current  use of long term anticoagulation    DAPT (ASA + clopidogrel)   Depression    Diverticulitis    Dyspnea    GERD (gastroesophageal reflux disease)    Grade I diastolic dysfunction    Hepatitis C 2012   No longer has Hep C   HLD (hyperlipidemia)    Hypertension    MI (myocardial infarction) ([place])    Polysubstance abuse (HCC)    cocaine, marijuana, ETOH   PUD (peptic ulcer disease)    S/P angioplasty with stent 06/10/2016   a.) 90% stenosis of pLAD to mLAD - 2.5 x 18 mm Xience Alpine (DES x 1) placed to pLAD   S/P PTCA (percutaneous transluminal coronary angioplasty) 12/04/2019   a.) 60% in stent restenosis of DES to pLAD; LVEF 65%.   Schizophrenia ([place])    Stroke [facility])    Valvular insufficiency    a.) Mild MR, TR, PR; mild to moderate AR on 03/05/2018 TTE     Transfusion: none   Consultants (if any):   Discharged Condition: Improved  Hospital Course: Keith Gibbs is an 60 y.o. male who was admitted 01/19/2021 with a diagnosis of right knee osteoarthritis and went to the operating room on 01/19/2021 and underwent the above named procedures.    Surgeries: Procedure(s): TOTAL KNEE ARTHROPLASTY - Rachelle Hora to Assist on 01/19/2021 Patient tolerated the surgery well. Taken to PACU where she was stabilized and then transferred to the orthopedic floor.  Started on aspirin and plavix  SCDs. Heels elevated on bed with rolled towels. No evidence of DVT. Negative [place]. Physical therapy started on day #1 for gait training and transfer. OT started day #1 for ADL and assisted devices.  The patient ambulated 200 feet with physical therapy  Patient's foley was d/c on day #1. Patient's IV  was d/c on day #2.  On post op day #2 patient was stable and ready for discharge to home with HHPT.    He was given perioperative antibiotics:  Anti-infectives (From admission, onward)    Start     Dose/Rate Route Frequency Ordered Stop   01/19/21 1600  ceFAZolin (ANCEF) IVPB 2g/100 mL premix         2 g 200 mL/hr over 30 Minutes Intravenous Every 6 hours 01/19/21 1354 01/21/21 0456   01/19/21 0824  ceFAZolin (ANCEF) 2-4 GM/100ML-% IVPB       Note to Pharmacy: Trudie Reed   : cabinet override      01/19/21 0824 01/19/21 1039   01/19/21 0600  ceFAZolin (ANCEF) IVPB 2g/100 mL premix        2 g 200 mL/hr over 30 Minutes Intravenous On call to O.R. 01/19/21 4967 01/19/21 1035     .  He was given sequential compression devices, early ambulation, and aspirin Plavix for DVT prophylaxis.  He benefited maximally from the hospital stay and there were no complications.    Recent vital signs:  Vitals:   01/20/21 2014 01/21/21 0443  BP: 118/85 (!) 182/94  Pulse: 66 63  Resp: 19 18  Temp: 98.7 F (37.1 C) 98.4 F (36.9 C)  SpO2: 98% 97%    Recent laboratory studies:  Lab Results  Component Value Date   HGB 12.9 (L) 01/20/2021   HGB 13.8 01/05/2021   HGB 16.5 09/24/2020   Lab Results  Component Value Date   WBC 8.6  01/20/2021   PLT 174 01/20/2021   Lab Results  Component Value Date   INR 1.0 01/29/2020   Lab Results  Component Value Date   NA 136 01/20/2021   K 4.1 01/20/2021   CL 100 01/20/2021   CO2 27 01/20/2021   BUN 7 01/20/2021   CREATININE 0.83 01/20/2021   GLUCOSE 137 (H) 01/20/2021    Discharge Medications:   Allergies as of 01/21/2021       Reactions   Asenapine Maleate    Latuda [lurasidone Hcl] Other (See Comments)   tremor   Lurasidone    Naproxen Hives, Swelling   Pt denies.   Saphris [asenapine] Other (See Comments)   Increased tremors        Medication List     STOP taking these medications    oxyCODONE-acetaminophen 5-325 MG tablet Commonly known as: PERCOCET/ROXICET   predniSONE 10 MG tablet Commonly known as: DELTASONE       TAKE these medications    acetaminophen 500 MG tablet Commonly known as: TYLENOL Take 2 tablets (1,000 mg total) by mouth every 6 (six) hours.   albuterol 108 (90 Base) MCG/ACT inhaler Commonly  known as: VENTOLIN HFA Inhale 2 puffs into the lungs every 6 (six) hours as needed for wheezing or shortness of breath.   aspirin EC 81 MG tablet Take 81 mg by mouth daily.   atorvastatin 80 MG tablet Commonly known as: LIPITOR Take 80 mg by mouth daily.   Breztri Aerosphere 160-9-4.8 MCG/ACT Aero Generic drug: Budeson-Glycopyrrol-Formoterol Inhale 2 puffs into the lungs 2 (two) times daily.   buPROPion 150 MG 12 hr tablet Commonly known as: WELLBUTRIN SR Take 150 mg by mouth 2 (two) times daily.   cetirizine 10 MG tablet Commonly known as: ZYRTEC TAKE 1 TABLET BY MOUTH EVERY DAY   citalopram 20 MG tablet Commonly known as: CELEXA Take 20 mg by mouth daily.   clopidogrel 75 MG tablet Commonly known as: PLAVIX Take 75 mg by mouth daily.   cyclobenzaprine 10 MG tablet Commonly known as: FLEXERIL TAKE (1) TABLET BY MOUTH TWICE DAILY AS NEEDED FOR SPASMS.   docusate sodium 100 MG capsule Commonly known as: COLACE Take 1 capsule (100 mg total) by mouth 2 (two) times daily.   DULoxetine 60 MG capsule Commonly known as: CYMBALTA TAKE 1 CAPSULE BY MOUTH EVERY DAY   enalapril 2.5 MG tablet Commonly known as: VASOTEC Take 2.5 mg by mouth daily.   isosorbide mononitrate 60 MG 24 hr tablet Commonly known as: IMDUR Take 1 tablet (60 mg total) by mouth daily.   nitroGLYCERIN 0.4 MG SL tablet Commonly known as: NITROSTAT Place 1 tablet (0.4 mg total) under the tongue every 5 (five) minutes x 3 doses as needed for chest pain.   omeprazole 40 MG capsule Commonly known as: PRILOSEC TAKE 1 CAPSULE (40 MG TOTAL) BY MOUTH DAILY.   oxyCODONE 20 mg 12 hr tablet Commonly known as: OXYCONTIN Take 1 tablet (20 mg total) by mouth every 12 (twelve) hours.   Oxycodone HCl 10 MG Tabs Take 1-1.5 tablets (10-15 mg total) by mouth every 4 (four) hours as needed for severe pain (pain score 7-10).   tamsulosin 0.4 MG Caps capsule Commonly known as: FLOMAX TAKE 1 CAPSULE BY MOUTH EVERY  DAY   traMADol 50 MG tablet Commonly known as: ULTRAM Take 1 tablet (50 mg total) by mouth every 6 (six) hours as needed.   traZODone 100 MG tablet Commonly known as: DESYREL Take 100 mg  by mouth at bedtime.               Durable Medical Equipment  (From admission, onward)           Start     Ordered   01/19/21 1354  DME Walker rolling  Once       Question Answer Comment  Walker: With [place] Wheels   Patient needs a walker to treat with the following condition S/P TKR (total knee replacement) using cement, right      01/19/21 1354   01/19/21 1354  DME 3 n 1  Once        01/19/21 1354   01/19/21 1354  DME Bedside commode  Once       Question:  Patient needs a bedside commode to treat with the following condition  Answer:  S/P TKR (total knee replacement) using cement, right   01/19/21 1354            Diagnostic Studies: DG Knee 1-2 Views Right  Result Date: 01/19/2021 CLINICAL DATA:  Status post right total knee replacement EXAM: RIGHT KNEE - 1-2 VIEW COMPARISON:  None. FINDINGS: There is a right total knee arthroplasty in normal alignment without evidence of loosening or fracture. Expected soft tissue changes. IMPRESSION: Right total knee arthroplasty without evidence of immediate hardware complication. Electronically Signed   By: Maurine Simmering   On: 01/19/2021 14:37    Disposition:      Follow-up Information     Duanne Guess, PA-C Follow up in 2 week(s).   Specialties: Orthopedic Surgery, Emergency Medicine Contact information: [place] [address] [phone]                  Signed: Reche Dixon 01/21/2021, 6:37 AM

## 2021-01-20 NOTE — Progress Notes (Signed)
   Subjective: 1 Day Post-Op Procedure(s) (LRB): TOTAL KNEE ARTHROPLASTY - Rachelle Hora to Assist (Right) Patient reports pain as mild.   Patient is well, and has had no acute complaints or problems Denies any CP, SOB, ABD pain. We will continue therapy today.  Plan is to go Home after hospital stay.  Objective: Vital signs in last 24 hours: Temp:  [97.5 F (36.4 C)-98.3 F (36.8 C)] 98.3 F (36.8 C) (06/29 0427) Pulse Rate:  [49-62] 54 (06/29 0427) Resp:  [12-19] 19 (06/29 0427) BP: (99-159)/(65-93) 159/85 (06/29 0427) SpO2:  [97 %-100 %] 97 % (06/29 0427)  Intake/Output from previous day: 06/28 0701 - 06/29 0700 In: 2438.3 [I.V.:2238.3; IV Piggyback:200] Out: 7425 [Urine:4440; Blood:200] Intake/Output this shift: No intake/output data recorded.  Recent Labs    01/20/21 0544  HGB 12.9*   Recent Labs    01/20/21 0544  WBC 8.6  RBC 4.02*  HCT 37.1*  PLT 174   Recent Labs    01/20/21 0544  NA 136  K 4.1  CL 100  CO2 27  BUN 7  CREATININE 0.83  GLUCOSE 137*  CALCIUM 8.8*   No results for input(s): LABPT, INR in the last 72 hours.  EXAM General - Patient is Alert, Appropriate, and Oriented Extremity - Neurovascular intact Sensation intact distally Intact pulses distally Dorsiflexion/Plantar flexion intact No cellulitis present Compartment soft Dressing - dressing C/D/I, prevena intact with 250 cc drainage Motor Function - intact, moving foot and toes well on exam.   Past Medical History:  Diagnosis Date   Anginal pain ([place])    Anxiety    Arthritis    Bipolar 1 disorder (HCC)    Bursitis    CAD (coronary artery disease)    Chronic pain    COPD (chronic obstructive pulmonary disease) (HCC)    Current use of long term anticoagulation    DAPT (ASA + clopidogrel)   Depression    Diverticulitis    Dyspnea    GERD (gastroesophageal reflux disease)    Grade I diastolic dysfunction    Hepatitis C 2012   No longer has Hep C   HLD (hyperlipidemia)     Hypertension    MI (myocardial infarction) ([place])    Polysubstance abuse (HCC)    cocaine, marijuana, ETOH   PUD (peptic ulcer disease)    S/P angioplasty with stent 06/10/2016   a.) 90% stenosis of pLAD to mLAD - 2.5 x 18 mm Xience Alpine (DES x 1) placed to pLAD   S/P PTCA (percutaneous transluminal coronary angioplasty) 12/04/2019   a.) 60% in stent restenosis of DES to pLAD; LVEF 65%.   Schizophrenia ([place])    Stroke [facility])    Valvular insufficiency    a.) Mild MR, TR, PR; mild to moderate AR on 03/05/2018 TTE    Assessment/Plan:   1 Day Post-Op Procedure(s) (LRB): TOTAL KNEE ARTHROPLASTY - Rachelle Hora to Assist (Right) Active Problems:   S/P TKR (total knee replacement) using cement, right  Estimated body mass index is 27.5 kg/m as calculated from the following:   Height as of this encounter: 6\' 3"  (1.905 m).   Weight as of 01/14/21: 99.8 kg. Advance diet Up with therapy Work on [employer] and VSS Pain well controlled CM to assist with discharge  DVT Prophylaxis - Aspirin, TED hose, and Plavix, SCDs Weight-Bearing as tolerated to right leg   T. Rachelle Hora, PA-C [place] 01/20/2021, 7:58 AM

## 2021-01-20 NOTE — Discharge Instructions (Signed)
TOTAL KNEE REPLACEMENT POSTOPERATIVE DIRECTIONS  Knee Rehabilitation, Guidelines Following Surgery  Results after knee surgery are often greatly improved when you follow the exercise, range of motion and muscle strengthening exercises prescribed by your doctor. Safety measures are also important to protect the knee from further injury. Any time any of these exercises cause you to have increased pain or swelling in your knee joint, decrease the amount until you are comfortable again and slowly increase them. If you have problems or questions, call your caregiver or physical therapist for advice.   HOME CARE INSTRUCTIONS  Remove items at home which could result in a fall. This includes throw rugs or furniture in walking pathways.  Continue to use polar care unit on the knee for pain and swelling from surgery. You may notice swelling that will progress down to the foot and ankle.  This is normal after surgery.  Elevate the leg when you are not up walking on it.   Continue to use the breathing machine which will help keep your temperature down.  It is common for your temperature to cycle up and down following surgery, especially at night when you are not up moving around and exerting yourself.  The breathing machine keeps your lungs expanded and your temperature down. Do not place pillow under knee, focus on keeping the knee STRAIGHT while resting  DIET You may resume your previous home diet once your are discharged from the hospital.  DRESSING / WOUND CARE / SHOWERING Please remove provena negative pressure dressing on 01/28/2021 and apply honey comb dressing. Keep dressing clean and dry at all times.   ACTIVITY Walk with your walker as instructed. Use walker as long as suggested by your caregivers. Avoid periods of inactivity such as sitting longer than an hour when not asleep. This helps prevent blood clots.  You may resume a sexual relationship in one month or when given the OK by your  doctor.  You may return to work once you are cleared by your doctor.  Do not drive a car for 6 weeks or until released by you surgeon.  Do not drive while taking narcotics.  WEIGHT BEARING Weight bearing as tolerated with assist device (walker, cane, etc) as directed, use it as long as suggested by your surgeon or therapist, typically at least 4-6 weeks.  POSTOPERATIVE CONSTIPATION PROTOCOL Constipation - defined medically as fewer than three stools per week and severe constipation as less than one stool per week.  One of the most common issues patients have following surgery is constipation.  Even if you have a regular bowel pattern at home, your normal regimen is likely to be disrupted due to multiple reasons following surgery.  Combination of anesthesia, postoperative narcotics, change in appetite and fluid intake all can affect your bowels.  In order to avoid complications following surgery, here are some recommendations in order to help you during your recovery period.  Colace (docusate) - Pick up an over-the-counter form of Colace or another stool softener and take twice a day as long as you are requiring postoperative pain medications.  Take with a full glass of water daily.  If you experience loose stools or diarrhea, hold the colace until you stool forms back up.  If your symptoms do not get better within 1 week or if they get worse, check with your doctor.  Dulcolax (bisacodyl) - Pick up over-the-counter and take as directed by the product packaging as needed to assist with the movement of your bowels.  Take with a full glass of water.  Use this product as needed if not relieved by Colace only.   MiraLax (polyethylene glycol) - Pick up over-the-counter to have on hand.  MiraLax is a solution that will increase the amount of water in your bowels to assist with bowel movements.  Take as directed and can mix with a glass of water, juice, soda, coffee, or tea.  Take if you go more than two  days without a movement. Do not use MiraLax more than once per day. Call your doctor if you are still constipated or irregular after using this medication for 7 days in a row.  If you continue to have problems with postoperative constipation, please contact the office for further assistance and recommendations.  If you experience "the worst abdominal pain ever" or develop nausea or vomiting, please contact the office immediatly for further recommendations for treatment.  ITCHING  If you experience itching with your medications, try taking only a single pain pill, or even half a pain pill at a time.  You can also use Benadryl over the counter for itching or also to help with sleep.   TED HOSE STOCKINGS Wear the elastic stockings on both legs for six weeks following surgery during the day but you may remove then at night for sleeping.  MEDICATIONS See your medication summary on the "After Visit Summary" that the nursing staff will review with you prior to discharge.  You may have some home medications which will be placed on hold until you complete the course of blood thinner medication.  It is important for you to complete the blood thinner medication as prescribed by your surgeon.  Continue your approved medications as instructed at time of discharge.  PRECAUTIONS If you experience chest pain or shortness of breath - call 911 immediately for transfer to the hospital emergency department.  If you develop a fever greater that 101 F, purulent drainage from wound, increased redness or drainage from wound, foul odor from the wound/dressing, or calf pain - CONTACT YOUR SURGEON.                                                   FOLLOW-UP APPOINTMENTS Make sure you keep all of your appointments after your operation with your surgeon and caregivers. You should call the office at the above phone number and make an appointment for approximately two weeks after the date of your surgery or on the date  instructed by your surgeon outlined in the "After Visit Summary".   RANGE OF MOTION AND STRENGTHENING EXERCISES  Rehabilitation of the knee is important following a knee injury or an operation. After just a few days of immobilization, the muscles of the thigh which control the knee become weakened and shrink (atrophy). Knee exercises are designed to build up the tone and strength of the thigh muscles and to improve knee motion. Often times heat used for twenty to thirty minutes before working out will loosen up your tissues and help with improving the range of motion but do not use heat for the first two weeks following surgery. These exercises can be done on a training (exercise) mat, on the floor, on a table or on a bed. Use what ever works the best and is most comfortable for you Knee exercises include:  Leg Lifts - While your  knee is still immobilized in a splint or cast, you can do straight leg raises. Lift the leg to 60 degrees, hold for 3 sec, and slowly lower the leg. Repeat 10-20 times 2-3 times daily. Perform this exercise against resistance later as your knee gets better.  Quad and Hamstring Sets - Tighten up the muscle on the front of the thigh (Quad) and hold for 5-10 sec. Repeat this 10-20 times hourly. Hamstring sets are done by pushing the foot backward against an object and holding for 5-10 sec. Repeat as with quad sets.  Leg Slides: Lying on your back, slowly slide your foot toward your buttocks, bending your knee up off the floor (only go as far as is comfortable). Then slowly slide your foot back down until your leg is flat on the floor again. Angel Wings: Lying on your back spread your legs to the side as far apart as you can without causing discomfort.  A rehabilitation program following serious knee injuries can speed recovery and prevent re-injury in the future due to weakened muscles. Contact your doctor or a physical therapist for more information on knee rehabilitation.   IF YOU  ARE TRANSFERRED TO A SKILLED REHAB FACILITY If the patient is transferred to a skilled rehab facility following release from the hospital, a list of the current medications will be sent to the facility for the patient to continue.  When discharged from the skilled rehab facility, please have the facility set up the patient's [place] prior to being released. Also, the skilled facility will be responsible for providing the patient with their medications at time of release from the facility to include their pain medication, the muscle relaxants, and their blood thinner medication. If the patient is still at the rehab facility at time of the two week follow up appointment, the skilled rehab facility will also need to assist the patient in arranging follow up appointment in our office and any transportation needs.  MAKE SURE YOU:  Understand these instructions.  Get help right away if you are not doing well or get worse.

## 2021-01-20 NOTE — Plan of Care (Signed)
?  Problem: Education: ?Goal: Knowledge of the prescribed therapeutic regimen will improve ?Outcome: Progressing ?Goal: Individualized Educational Video(s) ?Outcome: Progressing ?  ?Problem: Activity: ?Goal: Ability to avoid complications of mobility impairment will improve ?Outcome: Progressing ?Goal: Range of joint motion will improve ?Outcome: Progressing ?  ?Problem: Clinical Measurements: ?Goal: Postoperative complications will be avoided or minimized ?Outcome: Progressing ?  ?Problem: Pain Management: ?Goal: Pain level will decrease with appropriate interventions ?Outcome: Progressing ?  ?Problem: Skin Integrity: ?Goal: Will show signs of wound healing ?Outcome: Progressing ?  ?

## 2021-01-20 NOTE — Anesthesia Postprocedure Evaluation (Signed)
Anesthesia Post Note  Patient: Keith Gibbs  Procedure(s) Performed: TOTAL KNEE ARTHROPLASTY - Rachelle Hora to Assist (Right: Knee)  Patient location during evaluation: Nursing Unit Anesthesia Type: Spinal Level of consciousness: oriented, awake and awake and alert Pain management: pain level controlled Vital Signs Assessment: post-procedure vital signs reviewed and stable Respiratory status: spontaneous breathing, respiratory function stable and nonlabored ventilation Cardiovascular status: blood pressure returned to baseline and stable Postop Assessment: no headache and no backache Anesthetic complications: no   No notable events documented.   Last Vitals:  Vitals:   01/20/21 0427 01/20/21 0803  BP: (!) 159/85 (!) 162/86  Pulse: (!) 54 (!) 53  Resp: 19 18  Temp: 36.8 C 36.5 C  SpO2: 97% 96%    Last Pain:  Vitals:   01/20/21 0427  TempSrc: Oral  PainSc:                  Johnna Acosta

## 2021-01-20 NOTE — TOC Progression Note (Signed)
Transition of Care [facility]) - Progression Note    Patient Details  Name: Keith Gibbs MRN: [id number] Date of Birth: 1960/08/14  Transition of Care [facility]) CM/SW [place], RN Phone Number: 01/20/2021, 2:20 PM  Clinical Narrative:     Met with the patient and his mother in the room, He has transportation to and from the doctor, he lives at home with his mother, he needs a RW and a 3 in 1 I noified Suanne Marker and it will be brought into the room from [place]. He can afford his medicaiton He is set up with [facility] prior to surgery buy his doctor's office       Expected Discharge Plan and Services                                                 Social Determinants of Health (SDOH) Interventions    Readmission Risk Interventions No flowsheet data found.

## 2021-01-20 NOTE — Progress Notes (Signed)
Physical Therapy Treatment Patient Details Name: Keith Gibbs MRN: [id number] DOB: [birth year] Today's Date: 01/20/2021    History of Present Illness Pt admitted for R TKR. HIstory includes acute schizophrenia, anxiety, biopolar, COPD, depression, and GERD.    PT Comments    Pt is making good progress towards goals, however limited by pain. Antalgic gait pattern performed. Pain meds requesting, RN came to room to administer. Good endurance with there-ex. Needs stair training in AM.  Follow Up Recommendations  Home health PT     Equipment Recommendations  Rolling walker with 5" wheels;3in1 (PT)    Recommendations for Other Services       Precautions / Restrictions Precautions Precautions: Knee;Fall Precaution Booklet Issued: Yes (comment) Restrictions Weight Bearing Restrictions: Yes RLE Weight Bearing: Weight bearing as tolerated    Mobility  Bed Mobility Overal bed mobility: Needs Assistance Bed Mobility: Supine to Sit     Supine to sit: Min assist     General bed mobility comments: needs assist with surigcal leg. once seated, upright posture noted    Transfers Overall transfer level: Needs assistance Equipment used: Rolling walker (2 wheeled) Transfers: Sit to/from Stand Sit to Stand: Supervision         General transfer comment: bed elevated for ease. RW used with safe technique  Ambulation/Gait Ambulation/Gait assistance: Min Conservator, museum/gallery (Feet): 50 Feet Assistive device: Rolling walker (2 wheeled) Gait Pattern/deviations: Step-through pattern     General Gait Details: very pain limited this date. Reciprocal gait pattern performed with decreased foot clearance on surgical leg   Stairs             Wheelchair Mobility    Modified Rankin (Stroke Patients Only)       Balance Overall balance assessment: Needs assistance Sitting-balance support: Feet supported;Bilateral upper extremity supported Sitting balance-Leahy Scale:  Good     Standing balance support: Bilateral upper extremity supported Standing balance-Leahy Scale: Good Standing balance comment: Ue support on RW, but pt able to alterante UEs and sill demos good control                            Cognition Arousal/Alertness: Awake/alert Behavior During Therapy: WFL for tasks assessed/performed Overall Cognitive Status: Within Functional Limits for tasks assessed                                        Exercises Other Exercises Other Exercises: OT ed re: role, compression stocking mgt, polar care mgt, safety with RW use. Pt with good understanding, handout issued. Other Exercises: supine ther-ex performed on R LE including AP, quad sets, heel slides, hip abd/add, and SLRs. 10 reps performed with min assist    General Comments        Pertinent Vitals/Pain Pain Assessment: 0-10 Pain Score: 9  Pain Location: R knee Pain Descriptors / Indicators: Operative site guarding Pain Intervention(s): Limited activity within patient's tolerance;Repositioned;Ice applied    Home Living Family/patient expects to be discharged to:: Private residence Living Arrangements: Parent Available Help at Discharge: Family;Available 24 hours/day Type of Home: House Home Access: Stairs to enter Entrance Stairs-Rails: Can reach both Home Layout: One level Home Equipment: Cane - single point      Prior Function Level of Independence: Independent with assistive device(s)      Comments: using SPC for pain control   PT Goals (current  goals can now be found in the care plan section) Acute Rehab PT Goals Patient Stated Goal: to go home PT Goal Formulation: With patient Time For Goal Achievement: 02/03/21 Potential to Achieve Goals: Good Progress towards PT goals: Progressing toward goals    Frequency    BID      PT Plan Current plan remains appropriate    Co-evaluation              AM-PAC PT "6 Clicks" Mobility    Outcome Measure  Help needed turning from your back to your side while in a flat bed without using bedrails?: A Little Help needed moving from lying on your back to sitting on the side of a flat bed without using bedrails?: A Little Help needed moving to and from a bed to a chair (including a wheelchair)?: A Little Help needed standing up from a chair using your arms (e.g., wheelchair or bedside chair)?: A Little Help needed to walk in hospital room?: A Little Help needed climbing 3-5 steps with a railing? : A Little 6 Click Score: 18    End of Session Equipment Utilized During Treatment: Gait belt Activity Tolerance: Patient limited by pain Patient left: in bed;with bed alarm set;with SCD's reapplied Nurse Communication: Mobility status PT Visit Diagnosis: Difficulty in walking, not elsewhere classified (R26.2);Pain;Muscle weakness (generalized) (M62.81) Pain - Right/Left: Right Pain - part of body: Knee     Time: 1422-1500 PT Time Calculation (min) (ACUTE ONLY): 38 min  Charges:  $Gait Training: 8-22 mins $Therapeutic Exercise: 8-22 mins $Therapeutic Activity: 8-22 mins                     Keith Gibbs, PT, DPT [phone]    Keith Gibbs 01/20/2021, 4:37 PM

## 2021-01-20 NOTE — Evaluation (Signed)
Occupational Therapy Evaluation Patient Details Name: Keith Gibbs MRN: [id number] DOB: 1961/01/20 Today's Date: 01/20/2021    History of Present Illness Pt admitted for R TKR. HIstory includes acute schizophrenia, anxiety, biopolar, COPD, depression, and GERD.   Clinical Impression   Pt seen for OT evaluation this date in setting of acute hospitalization s/p R TKR. Pt reports that he is INDEP at baseline and lives at home with his mother. Pt presents this date with some decreased fxl activity tolerance and R LE pain impacting ability to safely and efficiently perform self care ADLs/ADL mobility. This date, on OT assessment, pt requires: SETUP for seated UB ADLs, MOD A for seated LB ADLs. CGA/SBA for STS with RW. Will continue to follow acutely and anticipate pt will require HHOT f/u to ensure safety with self care ADLs in home environment.     Follow Up Recommendations  Home health OT;Supervision - Intermittent    Equipment Recommendations  3 in 1 bedside commode;Tub/shower seat;Other (comment) (2ww, grabber)    Recommendations for Other Services       Precautions / Restrictions Precautions Precautions: Knee;Fall Precaution Booklet Issued: Yes (comment) Restrictions Weight Bearing Restrictions: Yes RLE Weight Bearing: Weight bearing as tolerated      Mobility Bed Mobility Overal bed mobility: Needs Assistance Bed Mobility: Supine to Sit     Supine to sit: Min guard     General bed mobility comments: Pt up to chair pre/post    Transfers Overall transfer level: Needs assistance Equipment used: Rolling walker (2 wheeled) Transfers: Sit to/from Stand Sit to Stand: Min guard;Supervision         General transfer comment: cues for hand placement with RW/safety.    Balance Overall balance assessment: Needs assistance Sitting-balance support: Feet supported;Bilateral upper extremity supported Sitting balance-Leahy Scale: Good     Standing balance support:  Bilateral upper extremity supported Standing balance-Leahy Scale: Good Standing balance comment: Ue support on RW, but pt able to alterante UEs and sill demos good control                           ADL either performed or assessed with clinical judgement   ADL Overall ADL's : Needs assistance/impaired                                       General ADL Comments: SETUP for seated UB ADLs, MOD A for seated LB ADLs. CGA/SBA for STS with RW.     Vision Patient Visual Report: No change from baseline       Perception     Praxis      Pertinent Vitals/Pain Pain Assessment: 0-10 Pain Score: 7  Pain Location: R knee Pain Descriptors / Indicators: Operative site guarding Pain Intervention(s): Limited activity within patient's tolerance;Monitored during session;Ice applied     Hand Dominance     Extremity/Trunk Assessment Upper Extremity Assessment Upper Extremity Assessment: Overall WFL for tasks assessed   Lower Extremity Assessment Lower Extremity Assessment: Defer to PT evaluation;RLE deficits/detail RLE: Unable to fully assess due to pain       Communication Communication Communication: No difficulties   Cognition Arousal/Alertness: Awake/alert Behavior During Therapy: WFL for tasks assessed/performed Overall Cognitive Status: Within Functional Limits for tasks assessed  General Comments       Exercises Exercises: Other exercises;Total Joint Total Joint Exercises Goniometric ROM: R knee AAROM: 5-78 degrees Other Exercises Other Exercises: OT ed re: role, compression stocking mgt, polar care mgt, safety with RW use. Pt with good understanding, handout issued.   Shoulder Instructions      Home Living Family/patient expects to be discharged to:: Private residence Living Arrangements: Parent Available Help at Discharge: Family;Available 24 hours/day Type of Home: House Home Access:  Stairs to enter [employer] of Steps: 6 Entrance Stairs-Rails: Can reach both Home Layout: One level               Home Equipment: Cane - single point          Prior Functioning/Environment Level of Independence: Independent with assistive device(s)        Comments: using SPC for pain control        OT Problem List: Decreased strength;Decreased activity tolerance;Impaired balance (sitting and/or standing);Pain      OT Treatment/Interventions: Self-care/ADL training;DME and/or AE instruction;Therapeutic activities;Balance training;Therapeutic exercise;Patient/family education    OT Goals(Current goals can be found in the care plan section) Acute Rehab OT Goals Patient Stated Goal: to go home OT Goal Formulation: With patient Time For Goal Achievement: 02/03/21 Potential to Achieve Goals: Good ADL Goals Pt Will Perform Lower Body Dressing: with modified independence;sit to/from stand Pt Will Transfer to Toilet: with modified independence;ambulating Pt Will Perform Toileting - Clothing Manipulation and hygiene: with modified independence;sit to/from stand  OT Frequency: Min 1X/week   Barriers to D/C:            Co-evaluation              AM-PAC OT "6 Clicks" Daily Activity     Outcome Measure Help from another person eating meals?: None Help from another person taking care of personal grooming?: None Help from another person toileting, which includes using toliet, bedpan, or urinal?: A Little Help from another person bathing (including washing, rinsing, drying)?: A Lot Help from another person to put on and taking off regular upper body clothing?: None Help from another person to put on and taking off regular lower body clothing?: A Lot 6 Click Score: 19   End of Session Equipment Utilized During Treatment: Gait belt;Rolling walker Nurse Communication: Mobility status  Activity Tolerance: Patient tolerated treatment well Patient left: with  call bell/phone within reach;in chair;with chair alarm set  OT Visit Diagnosis: Unsteadiness on feet (R26.81);Muscle weakness (generalized) (M62.81)                Time: 0940-1006 OT Time Calculation (min): 26 min Charges:  OT General Charges $OT Visit: 1 Visit OT Evaluation $OT Eval Moderate Complexity: 1 Mod OT Treatments $Self Care/Home Management : 8-22 mins  Gerrianne Scale, MS, OTR/L ascom [phone] 01/20/21, 2:06 PM

## 2021-01-20 NOTE — Evaluation (Signed)
Physical Therapy Evaluation Patient Details Name: Keith Gibbs MRN: [id number] DOB: 05/17/1961 Today's Date: 01/20/2021   History of Present Illness  Pt admitted for R TKR. HIstory includes acute schizophrenia, anxiety, biopolar, COPD, depression, and GERD.  Clinical Impression  Pt is a pleasant 60 year old male who was admitted for R TKR. Pt performs bed mobility, transfers, and ambulation with cga and RW. Pt demonstrates ability to perform 10 SLRs with independence, therefore does not require KI for mobility. Pt demonstrates deficits with strength/pain/mobility. Pt appears very motivated to participate. Would benefit from skilled PT to address above deficits and promote optimal return to PLOF. Recommend transition to [facility] upon discharge from acute hospitalization.     Follow Up Recommendations Home health PT    Equipment Recommendations  Rolling walker with 5" wheels;3in1 (PT)    Recommendations for Other Services       Precautions / Restrictions Precautions Precautions: Knee;Fall Precaution Booklet Issued: Yes (comment) Restrictions Weight Bearing Restrictions: Yes RLE Weight Bearing: Weight bearing as tolerated      Mobility  Bed Mobility Overal bed mobility: Needs Assistance Bed Mobility: Supine to Sit     Supine to sit: Min guard     General bed mobility comments: safe technique with upright posture. Once seated, little pain noted    Transfers Overall transfer level: Needs assistance Equipment used: Rolling walker (2 wheeled) Transfers: Sit to/from Stand Sit to Stand: Min guard         General transfer comment: cues for hand placement. RW used with upright posture  Ambulation/Gait Ambulation/Gait assistance: Min guard Gait Distance (Feet): 200 Feet Assistive device: Rolling walker (2 wheeled) Gait Pattern/deviations: Step-through pattern     General Gait Details: ambulated using reciprocal gait pattern and RW. Cues for symmetrical gait  pattern  Stairs            Wheelchair Mobility    Modified Rankin (Stroke Patients Only)       Balance Overall balance assessment: Needs assistance Sitting-balance support: Feet supported;Bilateral upper extremity supported Sitting balance-Leahy Scale: Good     Standing balance support: Bilateral upper extremity supported Standing balance-Leahy Scale: Good                               Pertinent Vitals/Pain Pain Assessment: 0-10 Pain Score: 7  Pain Location: R knee Pain Descriptors / Indicators: Operative site guarding Pain Intervention(s): Limited activity within patient's tolerance;Ice applied;Premedicated before session;Repositioned    Home Living Family/patient expects to be discharged to:: Private residence Living Arrangements: Parent Available Help at Discharge: Family;Available 24 hours/day Type of Home: House Home Access: Stairs to enter Entrance Stairs-Rails: Can reach both Entrance Stairs-Number of Steps: 6 Home Layout: One level Home Equipment: Cane - single point      Prior Function Level of Independence: Independent with assistive device(s)         Comments: using SPC for pain control     Hand Dominance        Extremity/Trunk Assessment   Upper Extremity Assessment Upper Extremity Assessment: Overall WFL for tasks assessed    Lower Extremity Assessment Lower Extremity Assessment: Generalized weakness (R LE grossly 3/5; L LE grossly 5/5)       Communication   Communication: No difficulties  Cognition Arousal/Alertness: Awake/alert Behavior During Therapy: WFL for tasks assessed/performed Overall Cognitive Status: Within Functional Limits for tasks assessed  General Comments      Exercises Total Joint Exercises Goniometric ROM: R knee AAROM: 5-78 degrees Other Exercises Other Exercises: supine ther-ex performed on R knee including AP, quad sets, SLRs, hip  abd/add, and SAQ. All ther-ex performed x 10 reps with cga   Assessment/Plan    PT Assessment Patient needs continued PT services  PT Problem List Decreased strength;Decreased balance;Decreased mobility;Decreased range of motion;Pain       PT Treatment Interventions DME instruction;Gait training;Stair training;Therapeutic exercise    PT Goals (Current goals can be found in the Care Plan section)  Acute Rehab PT Goals Patient Stated Goal: to go home PT Goal Formulation: With patient Time For Goal Achievement: 02/03/21 Potential to Achieve Goals: Good    Frequency BID   Barriers to discharge        Co-evaluation               AM-PAC PT "6 Clicks" Mobility  Outcome Measure Help needed turning from your back to your side while in a flat bed without using bedrails?: A Little Help needed moving from lying on your back to sitting on the side of a flat bed without using bedrails?: A Little Help needed moving to and from a bed to a chair (including a wheelchair)?: A Little Help needed standing up from a chair using your arms (e.g., wheelchair or bedside chair)?: A Little Help needed to walk in hospital room?: A Little Help needed climbing 3-5 steps with a railing? : A Little 6 Click Score: 18    End of Session Equipment Utilized During Treatment: Gait belt Activity Tolerance: Patient tolerated treatment well Patient left: in chair;with chair alarm set;with SCD's reapplied Nurse Communication: Mobility status PT Visit Diagnosis: Difficulty in walking, not elsewhere classified (R26.2);Pain;Muscle weakness (generalized) (M62.81) Pain - Right/Left: Right Pain - part of body: Knee    Time: 7741-2878 PT Time Calculation (min) (ACUTE ONLY): 40 min   Charges:   PT Evaluation $PT Eval Low Complexity: 1 Low PT Treatments $Gait Training: 8-22 mins $Therapeutic Exercise: 8-22 mins        Greggory Stallion, PT, DPT [phone]   Welda Azzarello 01/20/2021, 12:12 PM

## 2021-01-21 MED ORDER — SODIUM CHLORIDE 0.9 % IV SOLN
12.5000 mg | Freq: Four times a day (QID) | INTRAVENOUS | Status: DC | PRN
  Administered 2021-01-21: 12.5 mg via INTRAVENOUS
  Filled 2021-01-21: qty 0.5

## 2021-01-21 NOTE — Progress Notes (Signed)
Physical Therapy Treatment Patient Details Name: Keith Gibbs MRN: [id number] DOB: 1961-06-06 Today's Date: 01/21/2021    History of Present Illness Pt admitted for R TKR. HIstory includes acute schizophrenia, anxiety, biopolar, COPD, depression, and GERD.    PT Comments    Pt is making good progress towards goals with ability to safely navigate stairs for home use. Improved gait sequencing in hallway with reciprocal gait pattern. Slight improvement with AAROM. Pt planning to dc this date. No further acute needs.   Follow Up Recommendations  Home health PT     Equipment Recommendations  Rolling walker with 5" wheels;3in1 (PT)    Recommendations for Other Services       Precautions / Restrictions Precautions Precautions: Knee;Fall Precaution Booklet Issued: Yes (comment) Restrictions Weight Bearing Restrictions: Yes RLE Weight Bearing: Weight bearing as tolerated    Mobility  Bed Mobility Overal bed mobility: Needs Assistance Bed Mobility: Supine to Sit     Supine to sit: Min guard     General bed mobility comments: safe technique. pain limited    Transfers Overall transfer level: Needs assistance Equipment used: Rolling walker (2 wheeled) Transfers: Sit to/from Stand Sit to Stand: Supervision         General transfer comment: required bed elevated. RW used safely  Ambulation/Gait Ambulation/Gait assistance: Min Conservator, museum/gallery (Feet): 250 Feet Assistive device: Rolling walker (2 wheeled) Gait Pattern/deviations: Step-through pattern     General Gait Details: ambulated in hallway with RW. Improved to reciprocal gait pattern.   Stairs Stairs: Yes Stairs assistance: Min guard Stair Management: Two rails;Forwards Number of Stairs: 8 General stair comments: demonstrated prior to performance. Able to use R rail ascending and L rail descending. Step to gait pattern with B UE on 1 rail as his rails too wide to reach bilat. Seated rest break required  in between sessions due to fatigue/pain.   Wheelchair Mobility    Modified Rankin (Stroke Patients Only)       Balance Overall balance assessment: Needs assistance Sitting-balance support: Feet supported;Bilateral upper extremity supported Sitting balance-Leahy Scale: Good     Standing balance support: Bilateral upper extremity supported Standing balance-Leahy Scale: Good                              Cognition Arousal/Alertness: Awake/alert Behavior During Therapy: WFL for tasks assessed/performed Overall Cognitive Status: Within Functional Limits for tasks assessed                                        Exercises Total Joint Exercises Goniometric ROM: R knee AAROM: 3-81 degrees, pain limited Other Exercises Other Exercises: supine ther-ex performed on R LE including AP, quad sets, SAQ, hip abd/add, and SLRs. 12 reps performed with min assist. Educated on written HEP Other Exercises: ambulated to bathroom with safe technique. Able to stand and urinate without LOB.    General Comments        Pertinent Vitals/Pain Pain Assessment: 0-10 Pain Score: 10-Worst pain ever Pain Location: R knee Pain Descriptors / Indicators: Operative site guarding Pain Intervention(s): Limited activity within patient's tolerance;Ice applied;Repositioned    Home Living                      Prior Function            PT Goals (current goals  can now be found in the care plan section) Acute Rehab PT Goals Patient Stated Goal: to go home PT Goal Formulation: With patient Time For Goal Achievement: 02/03/21 Potential to Achieve Goals: Good Progress towards PT goals: Progressing toward goals    Frequency    BID      PT Plan Current plan remains appropriate    Co-evaluation              AM-PAC PT "6 Clicks" Mobility   Outcome Measure  Help needed turning from your back to your side while in a flat bed without using bedrails?: A  Little Help needed moving from lying on your back to sitting on the side of a flat bed without using bedrails?: A Little Help needed moving to and from a bed to a chair (including a wheelchair)?: A Little Help needed standing up from a chair using your arms (e.g., wheelchair or bedside chair)?: A Little Help needed to walk in hospital room?: A Little Help needed climbing 3-5 steps with a railing? : A Little 6 Click Score: 18    End of Session Equipment Utilized During Treatment: Gait belt Activity Tolerance: Patient limited by pain Patient left: in chair;with chair alarm set Nurse Communication: Mobility status PT Visit Diagnosis: Difficulty in walking, not elsewhere classified (R26.2);Pain;Muscle weakness (generalized) (M62.81) Pain - Right/Left: Right Pain - part of body: Knee     Time: 4010-2725 PT Time Calculation (min) (ACUTE ONLY): 49 min  Charges:  $Gait Training: 23-37 mins $Therapeutic Exercise: 8-22 mins                     Greggory Stallion, PT, DPT [phone]    Yaslene Lindamood 01/21/2021, 12:07 PM

## 2021-01-21 NOTE — Plan of Care (Signed)
  Problem: Education: Goal: Knowledge of the prescribed therapeutic regimen will improve Outcome: Not Progressing Goal: Individualized Educational Video(s) Outcome: Not Progressing   Problem: Activity: Goal: Ability to avoid complications of mobility impairment will improve Outcome: Not Progressing Goal: Range of joint motion will improve Outcome: Not Progressing   Problem: Clinical Measurements: Goal: Postoperative complications will be avoided or minimized Outcome: Not Progressing   Problem: Pain Management: Goal: Pain level will decrease with appropriate interventions Outcome: Not Progressing   Problem: Skin Integrity: Goal: Will show signs of wound healing Outcome: Not Progressing

## 2021-01-21 NOTE — Progress Notes (Signed)
Subjective: 2 Days Post-Op Procedure(s) (LRB): TOTAL KNEE ARTHROPLASTY - Rachelle Hora to Assist (Right) Patient reports pain as mild.   Patient is well, and has had no acute complaints or problems Denies any CP, SOB, ABD pain. We will continue therapy today.  Plan is to go Home after hospital stay.  Objective: Vital signs in last 24 hours: Temp:  [97.7 F (36.5 C)-98.8 F (37.1 C)] 98.4 F (36.9 C) (06/30 0443) Pulse Rate:  [53-66] 63 (06/30 0443) Resp:  [16-19] 18 (06/30 0443) BP: (110-182)/(77-94) 182/94 (06/30 0443) SpO2:  [94 %-98 %] 97 % (06/30 0443)  Intake/Output from previous day: 06/29 0701 - 06/30 0700 In: 1037.5 [P.O.:940; I.V.:47.5; IV Piggyback:50] Out: -  Intake/Output this shift: Total I/O In: 97.5 [I.V.:47.5; IV Piggyback:50] Out: -   Recent Labs    01/20/21 0544  HGB 12.9*   Recent Labs    01/20/21 0544  WBC 8.6  RBC 4.02*  HCT 37.1*  PLT 174   Recent Labs    01/20/21 0544  NA 136  K 4.1  CL 100  CO2 27  BUN 7  CREATININE 0.83  GLUCOSE 137*  CALCIUM 8.8*   No results for input(s): LABPT, INR in the last 72 hours.  EXAM General - Patient is Alert, Appropriate, and Oriented Extremity - Neurovascular intact Sensation intact distally Intact pulses distally Dorsiflexion/Plantar flexion intact No cellulitis present Compartment soft Dressing - dressing C/D/I, prevena intact with 200 cc drainage Motor Function - intact, moving foot and toes well on exam.  The patient ambulated 200 feet with physical therapy  Past Medical History:  Diagnosis Date   Anginal pain ([place])    Anxiety    Arthritis    Bipolar 1 disorder (HCC)    Bursitis    CAD (coronary artery disease)    Chronic pain    COPD (chronic obstructive pulmonary disease) (HCC)    Current use of long term anticoagulation    DAPT (ASA + clopidogrel)   Depression    Diverticulitis    Dyspnea    GERD (gastroesophageal reflux disease)    Grade I diastolic dysfunction     Hepatitis C 2012   No longer has Hep C   HLD (hyperlipidemia)    Hypertension    MI (myocardial infarction) (HCC)    Polysubstance abuse (HCC)    cocaine, marijuana, ETOH   PUD (peptic ulcer disease)    S/P angioplasty with stent 06/10/2016   a.) 90% stenosis of pLAD to mLAD - 2.5 x 18 mm Xience Alpine (DES x 1) placed to pLAD   S/P PTCA (percutaneous transluminal coronary angioplasty) 12/04/2019   a.) 60% in stent restenosis of DES to pLAD; LVEF 65%.   Schizophrenia ([place])    Stroke [facility])    Valvular insufficiency    a.) Mild MR, TR, PR; mild to moderate AR on 03/05/2018 TTE    Assessment/Plan:   2 Days Post-Op Procedure(s) (LRB): TOTAL KNEE ARTHROPLASTY - Rachelle Hora to Assist (Right) Active Problems:   S/P TKR (total knee replacement) using cement, right  Estimated body mass index is 27.5 kg/m as calculated from the following:   Height as of this encounter: 6\' 3"  (1.905 m).   Weight as of 01/14/21: 99.8 kg. Advance diet Up with therapy Work on BM  Pain well controlled CM to assist with discharge home today with home health physical therapy  DVT Prophylaxis - Aspirin, TED hose, and Plavix, SCDs Weight-Bearing as tolerated to right leg  Reche Dixon  PA-C [employer] 01/21/2021, 6:34 AM

## 2021-01-26 ENCOUNTER — Other Ambulatory Visit (HOSPITAL_COMMUNITY): Payer: Medicaid Other

## 2021-01-27 ENCOUNTER — Ambulatory Visit: Payer: Medicaid Other | Admitting: Internal Medicine

## 2021-01-29 ENCOUNTER — Ambulatory Visit: Payer: Medicaid Other | Admitting: Family Medicine

## 2021-01-29 ENCOUNTER — Encounter: Payer: Self-pay | Admitting: Family Medicine

## 2021-01-29 ENCOUNTER — Other Ambulatory Visit: Payer: Self-pay

## 2021-01-29 ENCOUNTER — Other Ambulatory Visit: Payer: Self-pay | Admitting: Family Medicine

## 2021-01-29 VITALS — BP 115/67 | HR 61 | Temp 98.5°F | Resp 15 | Wt 221.2 lb

## 2021-01-29 DIAGNOSIS — R1031 Right lower quadrant pain: Secondary | ICD-10-CM | POA: Diagnosis not present

## 2021-01-29 DIAGNOSIS — F32A Depression, unspecified: Secondary | ICD-10-CM

## 2021-01-29 DIAGNOSIS — K219 Gastro-esophageal reflux disease without esophagitis: Secondary | ICD-10-CM

## 2021-01-29 DIAGNOSIS — N50811 Right testicular pain: Secondary | ICD-10-CM

## 2021-01-29 DIAGNOSIS — F17209 Nicotine dependence, unspecified, with unspecified nicotine-induced disorders: Secondary | ICD-10-CM

## 2021-01-29 DIAGNOSIS — F419 Anxiety disorder, unspecified: Secondary | ICD-10-CM

## 2021-01-29 DIAGNOSIS — M5136 Other intervertebral disc degeneration, lumbar region: Secondary | ICD-10-CM

## 2021-01-29 MED ORDER — BUPROPION HCL ER (XL) 300 MG PO TB24: 300.0000 mg | ORAL_TABLET | Freq: Every day | ORAL | 5 refills | Status: DC

## 2021-01-29 NOTE — Assessment & Plan Note (Signed)
No evidence of hernia on exam This has been a longstanding issue that seems somewhat worse after recent right total knee replacement Urology has been following this without any findings of hernia or right testicular abnormality Advised him that at last urology appointment she had requested that he try to be seen for an acute appointment when swelling was present, so he will call today

## 2021-01-29 NOTE — Assessment & Plan Note (Signed)
As above, this is a longstanding issue that is followed by urology No acute abnormalities noted today Generalized tenderness on exam without palpable hernia He does have a known left varicocele but is asymptomatic Advised follow-up acutely with urology as was advised and Dr. Cherrie Gauze last note

## 2021-01-29 NOTE — Progress Notes (Signed)
Established patient visit   Patient: Keith Gibbs   DOB: 1961/06/04   60 y.o. Male  MRN: [id number] Visit Date: 01/29/2021  Today's healthcare provider: Lavon Paganini, MD   Chief Complaint  Patient presents with   Establish Care    Patient presents in office today he states to establish care with new PCP and discuss medical history, patient was a former patient of [name], FNP.    Testicle Pain   Nicotine Dependence   Subjective    Testicle Pain The patient's primary symptoms include pelvic pain, scrotal swelling (right side) and testicular pain. This is a new problem. The current episode started in the past 7 days (patient states that he recently had surgery in the past week on his knee and noticed pain and lump in right testicle). The problem occurs constantly. The problem has been unchanged. Associated symptoms include flank pain, joint pain, joint swelling and nausea. Pertinent negatives include no abdominal pain, anorexia, chest pain, chills, constipation, coughing, diarrhea, discolored urine, dysuria, fever, frequency, headaches, hematuria, hesitancy, painful intercourse, rash, shortness of breath, sore throat, urgency, urinary retention or vomiting. The testicular pain affects the right testicle. There is swelling in the right testicle. The color of the testicles is Red. The symptoms are aggravated by activity and tactile pressure. He has tried nothing for the symptoms.  Nicotine Dependence Presents for follow-up visit. Symptoms are negative for fatigue and sore throat. His urge triggers include company of smokers. The symptoms have been improving. He smokes < 1/2 a pack of cigarettes per day. Compliance with prior treatments has been good (patient currently on Wellbutrin).   Nausea He states that after his total knee replacement surgery he has been experiencing nausea. In addition to the nausea he also experiences dry mouth.   HPI     Establish Care     Additional comments: Patient presents in office today he states to establish care with new PCP and discuss medical history, patient was a former patient of [name], FNP.       Last edited by Minette Headland, CMA on 01/29/2021  8:28 AM.           Medications: Outpatient Medications Prior to Visit  Medication Sig   aspirin EC 81 MG tablet Take 81 mg by mouth daily.   atorvastatin (LIPITOR) 80 MG tablet Take 80 mg by mouth daily.   Budeson-Glycopyrrol-Formoterol (BREZTRI AEROSPHERE) 160-9-4.8 MCG/ACT AERO Inhale 2 puffs into the lungs 2 (two) times daily.   cetirizine (ZYRTEC) 10 MG tablet TAKE 1 TABLET BY MOUTH EVERY DAY   citalopram (CELEXA) 20 MG tablet Take 20 mg by mouth daily.   clopidogrel (PLAVIX) 75 MG tablet Take 75 mg by mouth daily.   DULoxetine (CYMBALTA) 60 MG capsule TAKE 1 CAPSULE BY MOUTH EVERY DAY   enalapril (VASOTEC) 2.5 MG tablet Take 2.5 mg by mouth daily.   isosorbide mononitrate (IMDUR) 60 MG 24 hr tablet Take 1 tablet (60 mg total) by mouth daily.   omeprazole (PRILOSEC) 40 MG capsule TAKE 1 CAPSULE (40 MG TOTAL) BY MOUTH DAILY.   oxyCODONE 10 MG TABS Take 1-1.5 tablets (10-15 mg total) by mouth every 4 (four) hours as needed for severe pain (pain score 7-10).   tamsulosin (FLOMAX) 0.4 MG CAPS capsule TAKE 1 CAPSULE BY MOUTH EVERY DAY   traMADol (ULTRAM) 50 MG tablet Take 1 tablet (50 mg total) by mouth every 6 (six) hours as needed.   traZODone (DESYREL) 100  MG tablet Take 100 mg by mouth at bedtime.   [DISCONTINUED] buPROPion (WELLBUTRIN SR) 150 MG 12 hr tablet Take 150 mg by mouth 2 (two) times daily.   acetaminophen (TYLENOL) 500 MG tablet Take 2 tablets (1,000 mg total) by mouth every 6 (six) hours.   albuterol (VENTOLIN HFA) 108 (90 Base) MCG/ACT inhaler Inhale 2 puffs into the lungs every 6 (six) hours as needed for wheezing or shortness of breath.   cyclobenzaprine (FLEXERIL) 10 MG tablet TAKE (1) TABLET BY MOUTH TWICE DAILY AS NEEDED FOR  SPASMS.   docusate sodium (COLACE) 100 MG capsule Take 1 capsule (100 mg total) by mouth 2 (two) times daily.   nitroGLYCERIN (NITROSTAT) 0.3 MG SL tablet SMARTSIG:1 Tablet(s) Sublingual PRN (Patient not taking: Reported on 01/29/2021)   nitroGLYCERIN (NITROSTAT) 0.4 MG SL tablet Place 1 tablet (0.4 mg total) under the tongue every 5 (five) minutes x 3 doses as needed for chest pain. (Patient not taking: Reported on 01/29/2021)   [DISCONTINUED] oxyCODONE (OXYCONTIN) 20 mg 12 hr tablet Take 1 tablet (20 mg total) by mouth every 12 (twelve) hours.   Facility-Administered Medications Prior to Visit  Medication Dose Route Frequency Provider   sodium chloride flush (NS) 0.9 % injection 3 mL  3 mL Intravenous Q12H Jake Bathe, FNP    Review of Systems  Constitutional:  Negative for chills, fatigue and fever.  HENT:  Negative for congestion, ear pain, rhinorrhea, sinus pain and sore throat.   Respiratory:  Negative for cough, shortness of breath and wheezing.   Cardiovascular:  Negative for chest pain and leg swelling.  Gastrointestinal:  Positive for nausea. Negative for abdominal pain, anorexia, blood in stool, constipation, diarrhea and vomiting.  Genitourinary:  Positive for flank pain, pelvic pain, scrotal swelling (right side) and testicular pain. Negative for dysuria, frequency, hesitancy and urgency.  Musculoskeletal:  Positive for joint pain.  Skin:  Negative for rash.  Neurological:  Negative for dizziness and headaches.  All other systems reviewed and are negative.     Objective    BP 115/67   Pulse 61   Temp 98.5 F (36.9 C) (Oral)   Resp 15   Wt 221 lb 3.2 oz (100.3 kg)   SpO2 100%   BMI 27.65 kg/m     Physical Exam Vitals reviewed.  Constitutional:      General: He is not in acute distress.    Appearance: Normal appearance. He is not diaphoretic.  HENT:     Head: Normocephalic and atraumatic.  Eyes:     General: No scleral icterus.    Conjunctiva/sclera:  Conjunctivae normal.  Cardiovascular:     Rate and Rhythm: Normal rate and regular rhythm.     Pulses: Normal pulses.     Heart sounds: Normal heart sounds. No murmur heard. Pulmonary:     Effort: Pulmonary effort is normal. No respiratory distress.     Breath sounds: Normal breath sounds. No wheezing or rhonchi.  Abdominal:     General: There is no distension.     Palpations: Abdomen is soft.     Tenderness: There is no abdominal tenderness.     Hernia: There is no hernia in the right inguinal area.  Genitourinary:    Penis: Normal.      Testes:        Right: Tenderness present. Mass or testicular hydrocele not present.        Left: Mass or tenderness not present.     Epididymis:  Right: Normal.  Musculoskeletal:     Cervical back: Neck supple.     Right lower leg: No edema.     Left lower leg: No edema.  Lymphadenopathy:     Cervical: No cervical adenopathy.  Skin:    General: Skin is warm and dry.     Capillary Refill: Capillary refill takes less than 2 seconds.     Findings: No rash.  Neurological:     Mental Status: He is alert and oriented to person, place, and time.     Cranial Nerves: No cranial nerve deficit.  Psychiatric:        Mood and Affect: Mood normal.        Behavior: Behavior normal.      No results found for any visits on 01/29/21.  Assessment & Plan     Problem List Items Addressed This Visit       Other   Right testicular pain - Primary    As above, this is a longstanding issue that is followed by urology No acute abnormalities noted today Generalized tenderness on exam without palpable hernia He does have a known left varicocele but is asymptomatic Advised follow-up acutely with urology as was advised and Dr. Cherrie Gauze last note       Tobacco use disorder, continuous    Longstanding issue Discussed benefits of smoking cessation Having some improvement with bupropion SR, but finding he has a dip in the middle of the day, so we will  change to XL dosing Also increase dose slightly to Wellbutrin XL 300 mg daily       Right groin pain    No evidence of hernia on exam This has been a longstanding issue that seems somewhat worse after recent right total knee replacement Urology has been following this without any findings of hernia or right testicular abnormality Advised him that at last urology appointment she had requested that he try to be seen for an acute appointment when swelling was present, so he will call today         Return in about 3 months (around 05/01/2021) for chronic disease f/u.      Frederic Jericho Moorehead,acting as a Education administrator for Lavon Paganini, MD.,have documented all relevant documentation on the behalf of Lavon Paganini, MD,as directed by  Lavon Paganini, MD while in the presence of Lavon Paganini, MD.  I, Lavon Paganini, MD, have reviewed all documentation for this visit. The documentation on 01/29/21 for the exam, diagnosis, procedures, and orders are all accurate and complete.   Josseline Reddin, Dionne Bucy, MD, MPH [facility]

## 2021-01-29 NOTE — Assessment & Plan Note (Signed)
Longstanding issue Discussed benefits of smoking cessation Having some improvement with bupropion SR, but finding he has a dip in the middle of the day, so we will change to XL dosing Also increase dose slightly to Wellbutrin XL 300 mg daily

## 2021-01-29 NOTE — Telephone Encounter (Signed)
  Notes to clinic:  Review for refills Looks like patient has different pcp   Requested Prescriptions  Pending Prescriptions Disp Refills   omeprazole (PRILOSEC) 40 MG capsule [Pharmacy Med Name: OMEPRAZOLE DR 40 MG CAPSULE] 30 capsule 0    Sig: TAKE 1 CAPSULE (40 MG TOTAL) BY MOUTH DAILY.      There is no refill protocol information for this order      cetirizine (ZYRTEC) 10 MG tablet [Pharmacy Med Name: CETIRIZINE HCL 10 MG TABLET] 30 tablet 0    Sig: TAKE 1 TABLET BY MOUTH EVERY DAY      There is no refill protocol information for this order      tamsulosin (FLOMAX) 0.4 MG CAPS capsule [Pharmacy Med Name: TAMSULOSIN HCL 0.4 MG CAPSULE] 30 capsule 0    Sig: TAKE 1 CAPSULE BY MOUTH EVERY DAY      There is no refill protocol information for this order      DULoxetine (CYMBALTA) 60 MG capsule [Pharmacy Med Name: DULOXETINE HCL DR 60 MG CAP] 60 capsule 0    Sig: TAKE 1 CAPSULE BY MOUTH EVERY DAY      There is no refill protocol information for this order

## 2021-01-29 NOTE — Patient Instructions (Signed)
[facility] [address] [place], [place] [address] [phone]

## 2021-02-25 ENCOUNTER — Ambulatory Visit: Payer: Self-pay | Admitting: *Deleted

## 2021-02-25 NOTE — Telephone Encounter (Signed)
Pt called in co chest tightness like someone is sitting on my chest.  I'm weak, dizzy and feel disoriented.   I'm sweating.   My throat feels like it's trying to close.   I need to see my dr today.   Has a history of stent insertion in his heart. See triage notes. I have referred him to the ED.   His mother is taking him to [facility] now.

## 2021-02-25 NOTE — Telephone Encounter (Signed)
Reason for Disposition  SEVERE chest pain    Having chest tightness.   Has cardiac history stent inserted.  Answer Assessment - Initial Assessment Questions 1. LOCATION: "Where does it hurt?"       My chest feels heavy in my lungs.   I feel weak and disoriented.   I have diarrhea and I'm feeling bad.   Can I see my doctor today?    I'm sweating.   I've had a stent put in my heart before.  This feels different.    It's a different chest tightness.   My throat feeling like it's closing.     I had a knee replacement done 5 weeks ago. 2. RADIATION: "Does the pain go anywhere else?" (e.g., into neck, jaw, arms, back)     I feel like something is laying on my chest.   It's not a pain.   It's heaviness. 3. ONSET: "When did the chest pain begin?" (Minutes, hours or days)      A couple of days ago. 4. PATTERN "Does the pain come and go, or has it been constant since it started?"  "Does it get worse with exertion?"      I'm wheezing.     I have COPD.   I smoke.  I'm trying to quit.    5. DURATION: "How long does it last" (e.g., seconds, minutes, hours)     Chest is tight for a couple of days.   The first day I was feeling crappy.  Last couple of days I have no energy, no appetite.   Diarrhea.   I have nausea.   My throat feels like it closing.   Tonsel have been removed. 6. SEVERITY: "How bad is the pain?"  (e.g., Scale 1-10; mild, moderate, or severe)    - MILD (1-3): doesn't interfere with normal activities     - MODERATE (4-7): interferes with normal activities or awakens from sleep    - SEVERE (8-10): excruciating pain, unable to do any normal activities       2-3  I have NTG pills I can take.   I feel like someone is sitting on my chest.    I see my heart doctor tomorrow.    7. CARDIAC RISK FACTORS: "Do you have any history of heart problems or risk factors for heart disease?" (e.g., angina, prior heart attack; diabetes, high blood pressure, high cholesterol, smoker, or strong family history of  heart disease)     Had stent, smoker 8. PULMONARY RISK FACTORS: "Do you have any history of lung disease?"  (e.g., blood clots in lung, asthma, emphysema, birth control pills)     COPD 9. CAUSE: "What do you think is causing the chest pain?"   I'm not feeling good 10. OTHER SYMPTOMS: "Do you have any other symptoms?" (e.g., dizziness, nausea, vomiting, sweating, fever, difficulty breathing, cough)       See above.    Dizziness yes 11. PREGNANCY: "Is there any chance you are pregnant?" "When was your last menstrual period?"       N/A  Protocols used: Chest Pain-A-AH

## 2021-03-12 ENCOUNTER — Ambulatory Visit (INDEPENDENT_AMBULATORY_CARE_PROVIDER_SITE_OTHER): Payer: Medicaid Other | Admitting: Clinical

## 2021-03-12 ENCOUNTER — Other Ambulatory Visit: Payer: Self-pay

## 2021-03-12 DIAGNOSIS — F1010 Alcohol abuse, uncomplicated: Secondary | ICD-10-CM

## 2021-03-12 DIAGNOSIS — F1410 Cocaine abuse, uncomplicated: Secondary | ICD-10-CM

## 2021-03-12 DIAGNOSIS — F331 Major depressive disorder, recurrent, moderate: Secondary | ICD-10-CM

## 2021-03-12 DIAGNOSIS — F4310 Post-traumatic stress disorder, unspecified: Secondary | ICD-10-CM

## 2021-03-12 DIAGNOSIS — F1110 Opioid abuse, uncomplicated: Secondary | ICD-10-CM

## 2021-03-12 DIAGNOSIS — F1510 Other stimulant abuse, uncomplicated: Secondary | ICD-10-CM
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
## 2021-04-05 ENCOUNTER — Other Ambulatory Visit: Payer: Self-pay

## 2021-04-05 ENCOUNTER — Ambulatory Visit (INDEPENDENT_AMBULATORY_CARE_PROVIDER_SITE_OTHER): Payer: Medicaid Other | Admitting: Student in an Organized Health Care Education/Training Program

## 2021-04-05 ENCOUNTER — Encounter (HOSPITAL_COMMUNITY): Payer: Self-pay | Admitting: Student in an Organized Health Care Education/Training Program

## 2021-04-05 VITALS — BP 121/67 | HR 61 | Ht 75.0 in | Wt 214.0 lb

## 2021-04-05 DIAGNOSIS — F250 Schizoaffective disorder, bipolar type: Secondary | ICD-10-CM

## 2021-04-05 DIAGNOSIS — F1990 Other psychoactive substance use, unspecified, uncomplicated: Secondary | ICD-10-CM

## 2021-04-05 DIAGNOSIS — F1120 Opioid dependence, uncomplicated: Secondary | ICD-10-CM

## 2021-04-05 DIAGNOSIS — F1410 Cocaine abuse, uncomplicated: Secondary | ICD-10-CM | POA: Diagnosis not present

## 2021-04-05 MED ORDER — TRAZODONE HCL 100 MG PO TABS: 100.0000 mg | ORAL_TABLET | Freq: Every day | ORAL | 0 refills | Status: DC

## 2021-04-05 MED ORDER — GABAPENTIN 100 MG PO CAPS: 100.0000 mg | ORAL_CAPSULE | Freq: Three times a day (TID) | ORAL | 0 refills | Status: DC

## 2021-04-05 NOTE — Patient Instructions (Addendum)
Dear Keith Gibbs,   Thank you so much for allowing [place] to be part of your care!  You were seen at [facility].    CARE INSTRUCTIONS Please go to [facility]: [address], [place], [place] [address] to get lab work done today,. Please stop taking your Citalopram Please start taking your Neurontin (Gabapentin) '100mg'$  in the AM and '200mg'$  at night. Continue to to take your Trazodone '100mg'$  at night.  Please let PCP/Specialists know of any changes that were made.  Please see medications section of this packet for any medication changes.   DOCTOR'S APPOINTMENT & FOLLOW UP CARE INSTRUCTIONS  Future Appointments  Date Time Provider [place]  05/03/2021  9:40 AM Gwyneth Sprout, FNP [facility]  05/13/2021  9:00 AM Gwyneth Revels P, LCSW [facility] None    RETURN PRECAUTIONS: Please return in 2 weeks.  Take care and be well!  [facility] [place], [place] [id number]
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
## 2021-04-06 ENCOUNTER — Telehealth (HOSPITAL_COMMUNITY): Payer: Self-pay | Admitting: Student in an Organized Health Care Education/Training Program

## 2021-04-06 NOTE — Telephone Encounter (Signed)
fax [facility] the order for blood work for drug testing today.  [facility] reports they do not do [facility] Urine Drug Screen.  [name] [phone]

## 2021-04-06 NOTE — Addendum Note (Signed)
Addended by: Clarnce Flock E on: 04/06/2021 09:17 AM   Modules accepted: Orders

## 2021-04-06 NOTE — Telephone Encounter (Signed)
Please fax [facility] the order for blood work for drug testing today.  [facility] reports they do not do [facility] Urine Drug Screen.  [name] [phone]

## 2021-04-07 LAB — COMPREHENSIVE METABOLIC PANEL
ALT: 14 IU/L (ref 0–44)
AST: 19 IU/L (ref 0–40)
Albumin/Globulin Ratio: 1.3 (ref 1.2–2.2)
Albumin: 3.7 g/dL — ABNORMAL LOW (ref 3.8–4.9)
Alkaline Phosphatase: 82 IU/L (ref 44–121)
BUN/Creatinine Ratio: 16 (ref 10–24)
BUN: 12 mg/dL (ref 8–27)
Bilirubin Total: 0.6 mg/dL (ref 0.0–1.2)
CO2: 24 mmol/L (ref 20–29)
Calcium: 8.9 mg/dL (ref 8.6–10.2)
Chloride: 96 mmol/L (ref 96–106)
Creatinine, Ser: 0.74 mg/dL — ABNORMAL LOW (ref 0.76–1.27)
Globulin, Total: 2.8 g/dL (ref 1.5–4.5)
Glucose: 97 mg/dL (ref 65–99)
Potassium: 3.7 mmol/L (ref 3.5–5.2)
Sodium: 134 mmol/L (ref 134–144)
Total Protein: 6.5 g/dL (ref 6.0–8.5)
eGFR: 104 mL/min/{1.73_m2} (ref >59)

## 2021-04-07 LAB — CBC WITH DIFFERENTIAL/PLATELET
Basophils Absolute: 0 10*3/uL (ref 0.0–0.2)
Basos: 0 %
EOS (ABSOLUTE): 0.4 10*3/uL (ref 0.0–0.4)
Eos: 4 %
Hematocrit: 34.2 % — ABNORMAL LOW (ref 37.5–51.0)
Hemoglobin: 11.8 g/dL — ABNORMAL LOW (ref 13.0–17.7)
Immature Grans (Abs): 0 10*3/uL (ref 0.0–0.1)
Immature Granulocytes: 0 %
Lymphocytes Absolute: 1.7 10*3/uL (ref 0.7–3.1)
Lymphs: 18 %
MCH: 31.1 pg (ref 26.6–33.0)
MCHC: 34.5 g/dL (ref 31.5–35.7)
MCV: 90 fL (ref 79–97)
Monocytes Absolute: 0.8 10*3/uL (ref 0.1–0.9)
Monocytes: 8 %
Neutrophils Absolute: 6.4 10*3/uL (ref 1.4–7.0)
Neutrophils: 70 %
Platelets: 271 10*3/uL (ref 150–450)
RBC: 3.8 x10E6/uL — ABNORMAL LOW (ref 4.14–5.80)
RDW: 12.4 % (ref 11.6–15.4)
WBC: 9.2 10*3/uL (ref 3.4–10.8)

## 2021-04-12 ENCOUNTER — Telehealth (HOSPITAL_COMMUNITY): Payer: Self-pay | Admitting: Student in an Organized Health Care Education/Training Program

## 2021-04-12 ENCOUNTER — Telehealth (HOSPITAL_COMMUNITY): Payer: Self-pay | Admitting: *Deleted

## 2021-04-12 NOTE — Telephone Encounter (Signed)
PATIENT CALLED STATED HE'S HAVING ALL KINDS OF SIDE EFFECTS FROM MEDICATION RECENTLY PRESCRIBED

## 2021-04-13 ENCOUNTER — Telehealth (HOSPITAL_COMMUNITY): Payer: Self-pay | Admitting: *Deleted

## 2021-04-13 NOTE — Telephone Encounter (Signed)
Patient left VM for writer to tell [facility] resident with the list of his side affects from his current meds. He reports body jerks at night, poor sleep, nausea and vomiting body aches and low to no energy. Will forward this concern to [place].

## 2021-04-14 ENCOUNTER — Other Ambulatory Visit (HOSPITAL_COMMUNITY): Payer: Self-pay | Admitting: Psychiatry

## 2021-04-14 DIAGNOSIS — F250 Schizoaffective disorder, bipolar type: Secondary | ICD-10-CM

## 2021-04-14 NOTE — Telephone Encounter (Signed)
Patient reports that he has been vomiting and his body has been shaking at night. Patient also described chills and feeling "disoriented."  Patient reports that he has not been able to get rest. Patient reports that stopped taking the Neurontin 2 days ago. Provider explained that patient was describing withdrawal as he reported he has not used illicit substances since being seen. Patient reports that he is starting to feel better in the last 24 hrs.  Provider recommended that patient continue taking his medications but if the symptoms reoccur to discontinue and follow-up next Monday at his appt.   PGY-2  Damita Dunnings, MD

## 2021-04-21 ENCOUNTER — Ambulatory Visit (HOSPITAL_COMMUNITY): Payer: Medicaid Other | Admitting: Licensed Clinical Social Worker

## 2021-04-26 ENCOUNTER — Other Ambulatory Visit: Payer: Self-pay

## 2021-04-26 ENCOUNTER — Ambulatory Visit (INDEPENDENT_AMBULATORY_CARE_PROVIDER_SITE_OTHER): Payer: Medicaid Other | Admitting: Student in an Organized Health Care Education/Training Program

## 2021-04-26 DIAGNOSIS — F1410 Cocaine abuse, uncomplicated: Secondary | ICD-10-CM | POA: Diagnosis not present

## 2021-04-26 DIAGNOSIS — F1110 Opioid abuse, uncomplicated: Secondary | ICD-10-CM | POA: Diagnosis not present

## 2021-04-26 DIAGNOSIS — F250 Schizoaffective disorder, bipolar type: Secondary | ICD-10-CM

## 2021-04-26 DIAGNOSIS — F1990 Other psychoactive substance use, unspecified, uncomplicated: Secondary | ICD-10-CM

## 2021-04-26 MED ORDER — DIVALPROEX SODIUM 250 MG PO DR TAB: 250.0000 mg | DELAYED_RELEASE_TABLET | Freq: Two times a day (BID) | ORAL | 0 refills | Status: DC

## 2021-04-26 MED ORDER — QUETIAPINE FUMARATE 100 MG PO TABS: 100.0000 mg | ORAL_TABLET | Freq: Every day | ORAL | 0 refills | Status: DC

## 2021-04-26 NOTE — Progress Notes (Addendum)
[facility] MD/PA/NP OP Progress Note  04/26/2021 1:36 PM [name]  MRN:  [id number]   I connected with  Keith Gibbs on 04/29/21 by a telephone  call and verified that I am speaking with the correct person using two identifiers. Spoke with patient over the phone for approximately 20 minutes.   Patient was located at his residence and physician was located in the office.   I discussed the limitations of evaluation and management by telemedicine. The patient expressed understanding and agreed to proceed.   Chief Complaint:   "I am more irritable." HPI:  Keith Gibbs is a 60 year old Caucasian, male with a reported history of substance use and mood instability.    Patient changed his visit today to phone visit.  Provider was able to reach patient over the phone.  Patient reports that he changed his visit today because he is currently withdrawing from taking some opioid medications that did not belong to him approximately 3 days ago.  Patient reports that he was having a lot of physical ailments and this led to him taking medication that was not prescribed to him for pain.  Patient reports that he does not feel that the gabapentin was helpful to him.  Patient reports that he has had increasing irritability endorsing that he has been cussing more.  Patient reports that he is not sleeping very well and reports that his mind will not "rest" and that there is "no piece in my head."  Patient reports that he and his mother are not getting along and endorses that he is reluctant to go into further details because his mother is in the home.  Patient reports his mother is not in the room with him but reports that they have very thin walls and he endorses that he is worried that his mother will hear him talk about what is bothering him regarding the relationship.  Patient is only able to report that "nothing seems to be going right."  Patient reports that he does "feel like my mom hates me."  Patient reports that  he has decreased appetite and general malaise.  Patient denies SI, HI and AVH.  Patient endorses that he continues to feel like people are talking about him and plotting against him but denies actually hearing these things.  Patient reports that he would be interested in getting assistance with his substance use as well as  medication to help with his mood instability and lack of sleep. Visit Diagnosis:    ICD-10-CM   1. Schizoaffective disorder, bipolar type ([place])  F25.0     2. Cocaine use disorder, mild, abuse ([place])  F14.10     3. Substance use disorder  F19.90     4. Opioid use disorder, mild, abuse ([place])  F11.10       Past Psychiatric History:  Patient endorses being hospitalized 3 times.  Patient reports the first time he was hospitalized in his 47s and the last time was a 1-2 years ago at [facility].  Reports that he was at [facility] he stayed approximately 30 days.  Patient reports that all 3 hospitalizations were due to suicide attempts.  Patient reports he has tried to hang himself (the belt broke), slit his wrist, OD.  Acute: At previous visit 04/05/2021 patient was started on gabapentin to help with cravings and mood instability.  Patient was reporting a history of elevated LFTs; therefore stronger mood stabilizers are not indicated at the time.  Patient reports on assessment today 04/26/2021  that he has been on Seroquel and Depakote in the past and responded well.    Past Medical History:  Past Medical History:  Diagnosis Date   Anginal pain ([place])    Anxiety    Arthritis    Bipolar 1 disorder (HCC)    Bursitis    CAD (coronary artery disease)    Chronic pain    COPD (chronic obstructive pulmonary disease) (HCC)    Current use of long term anticoagulation    DAPT (ASA + clopidogrel)   Depression    Diverticulitis    Dyspnea    GERD (gastroesophageal reflux disease)    Grade I diastolic dysfunction    Hepatitis C 2012   No longer has Hep C   HLD (hyperlipidemia)     Hypertension    MI (myocardial infarction) ([place])    Polysubstance abuse (HCC)    cocaine, marijuana, ETOH   PUD (peptic ulcer disease)    S/P angioplasty with stent 06/10/2016   a.) 90% stenosis of pLAD to mLAD - 2.5 x 18 mm Xience Alpine (DES x 1) placed to pLAD   S/P PTCA (percutaneous transluminal coronary angioplasty) 12/04/2019   a.) 60% in stent restenosis of DES to pLAD; LVEF 65%.   Schizophrenia ([facility])    Stroke [facility])    Valvular insufficiency    a.) Mild MR, TR, PR; mild to moderate AR on 03/05/2018 TTE    Past Surgical History:  Procedure Laterality Date   ABDOMINAL SURGERY     removed small piece of intestines due to [facility] Diverticulosis   APPENDECTOMY     BACK SURGERY     CARDIAC CATHETERIZATION Left 06/10/2016   Procedure: Left Heart Cath and Coronary Angiography;  Surgeon: Dionisio David, MD;  Location: [place] CV LAB;  Service: Cardiovascular;  Laterality: Left;   CARDIAC CATHETERIZATION N/A 06/10/2016   Procedure: Coronary Stent Intervention;  Surgeon: Yolonda Kida, MD;  Location: [place] [facility];  Service: Cardiovascular;  Laterality: N/A;   COLON SURGERY     COLONOSCOPY     COLONOSCOPY WITH PROPOFOL N/A 01/05/2017   Procedure: COLONOSCOPY WITH PROPOFOL;  Surgeon: Jonathon Bellows, MD;  Location: [facility] ENDOSCOPY;  Service: Endoscopy;  Laterality: N/A;   COLONOSCOPY WITH PROPOFOL N/A 02/13/2020   Procedure: COLONOSCOPY WITH PROPOFOL;  Surgeon: Virgel Manifold, MD;  Location: [facility] ENDOSCOPY;  Service: Endoscopy;  Laterality: N/A;   CORONARY ANGIOPLASTY WITH STENT PLACEMENT     ESOPHAGOGASTRODUODENOSCOPY (EGD) WITH PROPOFOL N/A 01/05/2017   Procedure: ESOPHAGOGASTRODUODENOSCOPY (EGD) WITH PROPOFOL;  Surgeon: Jonathon Bellows, MD;  Location: [facility] ENDOSCOPY;  Service: Endoscopy;  Laterality: N/A;   ESOPHAGOGASTRODUODENOSCOPY (EGD) WITH PROPOFOL N/A 02/13/2020   Procedure: ESOPHAGOGASTRODUODENOSCOPY (EGD) WITH PROPOFOL;  Surgeon: Virgel Manifold, MD;   Location: [facility] ENDOSCOPY;  Service: Endoscopy;  Laterality: N/A;   INTRAVASCULAR PRESSURE WIRE/FFR STUDY N/A 12/04/2019   Procedure: INTRAVASCULAR PRESSURE WIRE/FFR STUDY;  Surgeon: Sherren Mocha, MD;  Location: [place] [facility] LAB;  Service: Cardiovascular;  Laterality: N/A;   KNEE ARTHROSCOPY WITH MEDIAL MENISECTOMY Right 09/05/2017   Procedure: KNEE ARTHROSCOPY WITH MEDIAL AND LATERAL  MENISECTOMY PARTIAL SYNOVECTOMY;  Surgeon: Hessie Knows, MD;  Location: [facility] ORS;  Service: Orthopedics;  Laterality: Right;   LEFT HEART CATH AND CORONARY ANGIOGRAPHY N/A 12/04/2019   Procedure: LEFT HEART CATH AND CORONARY ANGIOGRAPHY;  Surgeon: Sherren Mocha, MD;  Location: [place] CV LAB;  Service: Cardiovascular;  Laterality: N/A;   SHOULDER SURGERY Right 04/09/2012   SPINE SURGERY  TOTAL KNEE ARTHROPLASTY Right 01/19/2021   Procedure: TOTAL KNEE ARTHROPLASTY - Rachelle Hora to Assist;  Surgeon: Hessie Knows, MD;  Location: [facility] ORS;  Service: Orthopedics;  Laterality: Right;    Family Psychiatric History: Sister: Depression, paternal grandfma-depression and anxiety, multiple maternal family members: EtOH use disorder, paternal aunt: schizophrenia was hospitalized, another paternal aunt: depression and anxiety    Family History:  Family History  Problem Relation Age of Onset   Osteoarthritis Mother    Heart disease Mother    Hypertension Mother    Depression Mother    Heart disease Father    Early death Father    Hypertension Father    Heart attack Father    Hypertension Sister    Prostate cancer Neg Hx    Bladder Cancer Neg Hx    Kidney cancer Neg Hx     Social History:  Social History   Socioeconomic History   Marital status: Widowed    Spouse name: Not on file   Number of children: Not on file   Years of education: Not on file   Highest education level: Not on file  Occupational History   Occupation: disability   Occupation: stocking at gas station    Comment: 2 days  per week  Tobacco Use   Smoking status: Every Day    Packs/day: 0.50    Years: 32.00    Pack years: 16.00    Types: Cigarettes   Smokeless tobacco: Never   Tobacco comments:    Continues to smoke approximately half a pack per day.  Vaping Use   Vaping Use: Never used  Substance and Sexual Activity   Alcohol use: Yes    Alcohol/week: 6.0 standard drinks    Types: 6 Cans of beer per week    Comment: occassionally    Drug use: Yes    Types: Cocaine, Marijuana    Comment: last on saturday   Sexual activity: Yes    Partners: Female    Birth control/protection: None  Other Topics Concern   Not on file  Social History Narrative   Not on file   Social Determinants of Health   Financial Resource Strain: Not on file  Food Insecurity: Not on file  Transportation Needs: Not on file  Physical Activity: Not on file  Stress: Not on file  Social Connections: Not on file    Allergies:  Allergies  Allergen Reactions   Asenapine Maleate    Latuda [Lurasidone Hcl] Other (See Comments)    tremor    Lurasidone    Naproxen Hives and Swelling    Pt denies.   Saphris [Asenapine] Other (See Comments)    Increased tremors     Metabolic Disorder Labs: Lab Results  Component Value Date   HGBA1C 5.2 12/28/2016   MPG 103 12/28/2016   No results found for: PROLACTIN Lab Results  Component Value Date   CHOL 116 12/12/2019   TRIG 87 12/12/2019   HDL 38 (L) 12/12/2019   CHOLHDL 3.1 12/12/2019   VLDL 12 12/28/2016   LDLCALC 61 12/12/2019   LDLCALC 39 04/02/2018   Lab Results  Component Value Date   TSH 1.610 09/08/2020   TSH 1.130 12/12/2019    Therapeutic Level Labs: No results found for: LITHIUM No results found for: VALPROATE No components found for:  CBMZ  Current Medications: Current Outpatient Medications  Medication Sig Dispense Refill   acetaminophen (TYLENOL) 500 MG tablet Take 2 tablets (1,000 mg total) by mouth every 6 (six)  hours. 30 tablet 0   albuterol  (VENTOLIN HFA) 108 (90 Base) MCG/ACT inhaler Inhale 2 puffs into the lungs every 6 (six) hours as needed for wheezing or shortness of breath. 18 g 6   aspirin EC 81 MG tablet Take 81 mg by mouth daily.     atorvastatin (LIPITOR) 80 MG tablet Take 80 mg by mouth daily.     Budeson-Glycopyrrol-Formoterol (BREZTRI AEROSPHERE) 160-9-4.8 MCG/ACT AERO Inhale 2 puffs into the lungs 2 (two) times daily. 10.7 g 11   buPROPion (WELLBUTRIN XL) 300 MG 24 hr tablet Take 1 tablet (300 mg total) by mouth daily. 30 tablet 5   cetirizine (ZYRTEC) 10 MG tablet TAKE 1 TABLET BY MOUTH EVERY DAY 90 tablet 3   clopidogrel (PLAVIX) 75 MG tablet Take 75 mg by mouth daily.     cyclobenzaprine (FLEXERIL) 10 MG tablet TAKE (1) TABLET BY MOUTH TWICE DAILY AS NEEDED FOR SPASMS. 60 tablet 0   docusate sodium (COLACE) 100 MG capsule Take 1 capsule (100 mg total) by mouth 2 (two) times daily. 10 capsule 0   DULoxetine (CYMBALTA) 60 MG capsule TAKE 1 CAPSULE BY MOUTH EVERY DAY 180 capsule 1   enalapril (VASOTEC) 2.5 MG tablet Take 2.5 mg by mouth daily.     gabapentin (NEURONTIN) 100 MG capsule Take 1 capsule (100 mg total) by mouth 3 (three) times daily. Take 100mg  in the AM and 200mg  at night. 42 capsule 0   isosorbide mononitrate (IMDUR) 60 MG 24 hr tablet Take 1 tablet (60 mg total) by mouth daily. 30 tablet 5   nitroGLYCERIN (NITROSTAT) 0.3 MG SL tablet      nitroGLYCERIN (NITROSTAT) 0.4 MG SL tablet Place 1 tablet (0.4 mg total) under the tongue every 5 (five) minutes x 3 doses as needed for chest pain. 30 tablet 3   omeprazole (PRILOSEC) 40 MG capsule TAKE 1 CAPSULE (40 MG TOTAL) BY MOUTH DAILY. 90 capsule 1   oxyCODONE 10 MG TABS Take 1-1.5 tablets (10-15 mg total) by mouth every 4 (four) hours as needed for severe pain (pain score 7-10). 30 tablet 0   tamsulosin (FLOMAX) 0.4 MG CAPS capsule TAKE 1 CAPSULE BY MOUTH EVERY DAY 90 capsule 1   traMADol (ULTRAM) 50 MG tablet Take 1 tablet (50 mg total) by mouth every 6 (six)  hours as needed. 30 tablet 0   traZODone (DESYREL) 100 MG tablet Take 100 mg by mouth at bedtime.     traZODone (DESYREL) 100 MG tablet Take 1 tablet (100 mg total) by mouth at bedtime. 30 tablet 0   No current facility-administered medications for this visit.   Facility-Administered Medications Ordered in Other Visits  Medication Dose Route Frequency Provider Last Rate Last Admin   sodium chloride flush (NS) 0.9 % injection 3 mL  3 mL Intravenous Q12H Keith Bathe, FNP          Psychiatric Specialty Exam: Review of Systems  Psychiatric/Behavioral:  Positive for sleep disturbance. Negative for hallucinations and suicidal ideas.    There were no vitals taken for this visit.There is no height or weight on file to calculate BMI.  General Appearance:  Unable to assess due to phone visit  Eye Contact:    Unable To assess due to phone visit  Speech:  Normal Rate  Volume:  Normal  Mood:  Dysphoric  Affect:   Unable to assess due to being telephone visit  Thought Process:  Coherent  Orientation:  Full (Time, Place, and Person)  Thought Content:  Logical   Suicidal Thoughts:  No  Homicidal Thoughts:  No  Memory:  Immediate;   Good Recent;   Good Remote;   Good  Judgement:  Fair  Insight:  Shallow  Psychomotor Activity:   Unable to assess due to telephone visit  Concentration:  Concentration: Fair  Recall:  [employer] of Knowledge: Fair  Language: Good      AIMS (if indicated): not done  Assets:  Communication Skills Resilience  ADL's:  Intact  Cognition: WNL  Sleep:  Poor   Screenings: AIMS    Flowsheet Row Admission (Discharged) from 12/27/2016 in [place] Total Score 1      AUDIT    Flowsheet Row Admission (Discharged) from 12/27/2016 in [facility]  Alcohol Use Disorder Identification Test Final Score (AUDIT) 32      GAD-7    [place] from 06/03/2019 in [place]  Total GAD-7 Score 14      PHQ2-9    Flowsheet Row Counselor from 03/12/2021 in [facility] Office Visit from 01/29/2021 in [place] Visit from 12/12/2019 in [place] Visit from 07/08/2019 in [place] from 06/03/2019 in [facility]  PHQ-2 Total Score 4 4 4  0 2  PHQ-9 Total Score 16 11 14  -- 9      Flowsheet Row Counselor from 03/12/2021 in [facility] Admission (Discharged) from 01/19/2021 in [place] (1A) Pre-Admission Testing 60 from 01/05/2021 in [place] TESTING  C-SSRS RISK CATEGORY No Risk No Risk No Risk        Assessment and Plan:  Jacobie Stamey is a 60 year old Caucasian, male who continues to endorse polysubstance abuse, mood instability, and feelings of mild paranoia.  Patient continues to report use of substances at this visit he reports that he use opioids within the past 3 days and is currently feeling the effects of withdrawal.  Patient remains interested in getting assistance with his substance use but is also primarily interested in getting assistance with his mood instability and irritability.  Patient's CBC and CMP indicate no leukocytopenia or transaminitis.  Patient failed trial with gabapentin.  Patient failure was 2/2 patient noncompliance; however, patient's severity of symptoms suggests he will likely need a stronger mood stabilizer.  Patient also continues to endorse feelings of paranoia with poor sleep continuing to support theory of schizoaffective disorder, bipolar type versus bipolar disorder.  Schizoaffective disorder, bipolar type (R/O bipolar disorder with psychosis, substance-induced psychosis, substance-induced mood disorder) - Discontinue gabapentin 100 mg 3 times daily - Start  Depakote 250 mg twice daily - Start Seroquel 100 mg nightly - Depakote level CBC, CMP ordered prior to next appointment  Polysubstance abuse - Have provided information for [facility]S as this will be most beneficial for patient and they are accepting of patient's insurance plan   PGY-2 Keith Busman, MD 04/26/2021, 1:36 PM

## 2021-05-03 ENCOUNTER — Ambulatory Visit (INDEPENDENT_AMBULATORY_CARE_PROVIDER_SITE_OTHER): Payer: Medicaid Other | Admitting: Family Medicine

## 2021-05-03 ENCOUNTER — Other Ambulatory Visit: Payer: Self-pay

## 2021-05-03 ENCOUNTER — Other Ambulatory Visit (HOSPITAL_COMMUNITY): Payer: Self-pay | Admitting: Psychiatry

## 2021-05-03 ENCOUNTER — Encounter: Payer: Self-pay | Admitting: Family Medicine

## 2021-05-03 VITALS — BP 114/71 | HR 62 | Temp 98.3°F | Resp 18 | Wt 208.9 lb

## 2021-05-03 DIAGNOSIS — Z1389 Encounter for screening for other disorder: Secondary | ICD-10-CM | POA: Insufficient documentation

## 2021-05-03 DIAGNOSIS — J342 Deviated nasal septum: Secondary | ICD-10-CM | POA: Insufficient documentation

## 2021-05-03 DIAGNOSIS — M545 Low back pain, unspecified: Secondary | ICD-10-CM

## 2021-05-03 DIAGNOSIS — F1910 Other psychoactive substance abuse, uncomplicated: Secondary | ICD-10-CM | POA: Insufficient documentation

## 2021-05-03 DIAGNOSIS — I861 Scrotal varices: Secondary | ICD-10-CM

## 2021-05-03 DIAGNOSIS — Z23 Encounter for immunization: Secondary | ICD-10-CM | POA: Diagnosis not present

## 2021-05-03 DIAGNOSIS — F1920 Other psychoactive substance dependence, uncomplicated: Secondary | ICD-10-CM | POA: Insufficient documentation

## 2021-05-03 DIAGNOSIS — F250 Schizoaffective disorder, bipolar type: Secondary | ICD-10-CM

## 2021-05-03 MED ORDER — MELOXICAM 15 MG PO TABS: 15.0000 mg | ORAL_TABLET | Freq: Every day | ORAL | 0 refills | Status: DC

## 2021-05-03 MED ORDER — PREDNISONE 20 MG PO TABS: 20.0000 mg | ORAL_TABLET | Freq: Every day | ORAL | 0 refills | Status: DC

## 2021-05-03 MED ORDER — CYCLOBENZAPRINE HCL 10 MG PO TABS: 10.0000 mg | ORAL_TABLET | Freq: Three times a day (TID) | ORAL | 1 refills | Status: DC

## 2021-05-03 NOTE — Progress Notes (Signed)
Established patient visit   Patient: Keith Gibbs   DOB: 1961/07/16   60 y.o. Male  MRN: [id number] Visit Date: 05/03/2021  Today's healthcare provider: Gwyneth Sprout, FNP   Chief Complaint  Patient presents with  . Follow-up  . Back Pain  . Joint Pain    Patient reports chronic pain in his upper and lower joints and states that at night time he "jumps" he states that he suffers with tremors and states that when he lays down he has shooting pain  . Skin Problem    Patient has concerns of changing moles on his trunk and lower back.  . Consult    Patient would like to discuss referral to a skin specialist and ENT. Patient reports that he used to do mixed martial arts and states that he has had injuries to his nose and states that he feels that his bone has shifted and reports trouble breathing.   Subjective    Back Pain This is a chronic problem. The current episode started in the past 7 days (patient reports that he has been lifting heavy objects). The problem is unchanged. The pain is present in the lumbar spine. Quality: tight. Radiates to: groin and right testicle.  HPI     Joint Pain    Additional comments: Patient reports chronic pain in his upper and lower joints and states that at night time he "jumps" he states that he suffers with tremors and states that when he lays down he has shooting pain        Skin Problem    Additional comments: Patient has concerns of changing moles on his trunk and lower back.        Consult    Additional comments: Patient would like to discuss referral to a skin specialist and ENT. Patient reports that he used to do mixed martial arts and states that he has had injuries to his nose and states that he feels that his bone has shifted and reports trouble breathing.      Last edited by Minette Headland, CMA on 05/03/2021  9:39 AM.      Follow up for Tobbacco Cessation   The patient was last seen for this 3 months ago. Changes  made at last visit include Discussed benefits of smoking cessation Having some improvement with bupropion SR, but finding he has a dip in the middle of the day, so we will change to XL dosing Also increase dose slightly to Wellbutrin XL 300 mg daily.  He reports poor compliance with treatment. He feels that condition is Unchanged. He is not having side effects.   -----------------------------------------------------------------------------------------   Medications: Outpatient Medications Prior to Visit  Medication Sig  . acetaminophen (TYLENOL) 500 MG tablet Take 2 tablets (1,000 mg total) by mouth every 6 (six) hours.  Marland Kitchen albuterol (VENTOLIN HFA) 108 (90 Base) MCG/ACT inhaler Inhale 2 puffs into the lungs every 6 (six) hours as needed for wheezing or shortness of breath.  Marland Kitchen aspirin EC 81 MG tablet Take 81 mg by mouth daily.  Marland Kitchen atorvastatin (LIPITOR) 80 MG tablet Take 80 mg by mouth daily.  . Budeson-Glycopyrrol-Formoterol (BREZTRI AEROSPHERE) 160-9-4.8 MCG/ACT AERO Inhale 2 puffs into the lungs 2 (two) times daily.  Marland Kitchen buPROPion (WELLBUTRIN XL) 300 MG 24 hr tablet Take 1 tablet (300 mg total) by mouth daily.  . cetirizine (ZYRTEC) 10 MG tablet TAKE 1 TABLET BY MOUTH EVERY DAY  . clopidogrel (PLAVIX) 75 MG tablet  Take 75 mg by mouth daily.  . divalproex (DEPAKOTE) 250 MG DR tablet Take 1 tablet (250 mg total) by mouth 2 (two) times daily.  Marland Kitchen docusate sodium (COLACE) 100 MG capsule Take 1 capsule (100 mg total) by mouth 2 (two) times daily.  . DULoxetine (CYMBALTA) 60 MG capsule TAKE 1 CAPSULE BY MOUTH EVERY DAY  . enalapril (VASOTEC) 2.5 MG tablet Take 2.5 mg by mouth daily.  . isosorbide mononitrate (IMDUR) 60 MG 24 hr tablet Take 1 tablet (60 mg total) by mouth daily.  . Multiple Vitamin (MULTIVITAMIN) tablet Take 1 tablet by mouth daily.  . nitroGLYCERIN (NITROSTAT) 0.3 MG SL tablet   . nitroGLYCERIN (NITROSTAT) 0.4 MG SL tablet Place 1 tablet (0.4 mg total) under the tongue every 5  (five) minutes x 3 doses as needed for chest pain.  . Omega-3 Fatty Acids (FISH OIL) 1200 MG CAPS Take by mouth.  Marland Kitchen omeprazole (PRILOSEC) 40 MG capsule TAKE 1 CAPSULE (40 MG TOTAL) BY MOUTH DAILY.  Marland Kitchen oxyCODONE 10 MG TABS Take 1-1.5 tablets (10-15 mg total) by mouth every 4 (four) hours as needed for severe pain (pain score 7-10).  . QUEtiapine (SEROQUEL) 100 MG tablet Take 1 tablet (100 mg total) by mouth at bedtime.  . tamsulosin (FLOMAX) 0.4 MG CAPS capsule TAKE 1 CAPSULE BY MOUTH EVERY DAY  . traMADol (ULTRAM) 50 MG tablet Take 1 tablet (50 mg total) by mouth every 6 (six) hours as needed.  . traZODone (DESYREL) 100 MG tablet Take 100 mg by mouth at bedtime.  . traZODone (DESYREL) 100 MG tablet Take 1 tablet (100 mg total) by mouth at bedtime.  . [DISCONTINUED] cyclobenzaprine (FLEXERIL) 10 MG tablet TAKE (1) TABLET BY MOUTH TWICE DAILY AS NEEDED FOR SPASMS.  . [DISCONTINUED] sodium chloride flush (NS) 0.9 % injection 3 mL    No facility-administered medications prior to visit.    Review of Systems  Musculoskeletal:  Positive for back pain.       Objective    BP 114/71   Pulse 62   Temp 98.3 F (36.8 C) (Oral)   Resp 18   Wt 208 lb 14.4 oz (94.8 kg)   SpO2 96%   BMI 26.11 kg/m     Physical Exam Vitals and nursing note reviewed.  Constitutional:      Appearance: Normal appearance. He is overweight.  HENT:     Head: Normocephalic and atraumatic.  Eyes:     Pupils: Pupils are equal, round, and reactive to light.  Cardiovascular:     Rate and Rhythm: Normal rate and regular rhythm.     Pulses: Normal pulses.     Heart sounds: Normal heart sounds.  Pulmonary:     Effort: Pulmonary effort is normal.     Breath sounds: Normal breath sounds.  Genitourinary:    Testes:        Right: Tenderness, swelling and varicocele present.        Left: Tenderness, swelling and varicocele present.     Tanner stage (genital): 5.     Comments: Bilateral varicocele- refused STI panel  'I'm not a very sexual person' Report of transfer of pain from back to testicles Musculoskeletal:        General: Normal range of motion.     Cervical back: Normal range of motion.  Skin:    General: Skin is warm and dry.     Capillary Refill: Capillary refill takes less than 2 seconds.     Findings: Ecchymosis and  erythema present.          Comments: Various states of bruising Feels pores on nose are 'large' Multiple nevi of concern  Neurological:     General: No focal deficit present.     Mental Status: He is alert and oriented to person, place, and time. Mental status is at baseline.     No results found for any visits on 05/03/21.  Assessment & Plan     Problem List Items Addressed This Visit       Respiratory   Acquired deviated nasal septum - Primary   Relevant Orders   Ambulatory referral to ENT     Musculoskeletal and Integument   Encounter for surveillance of abnormal nevi   Relevant Orders   Varicella-zoster vaccine IM (Completed)   Ambulatory referral to Dermatology     Other   Flu vaccine need   Relevant Orders   Flu Vaccine QUAD 17mo+IM (Fluarix, Fluzone & Alfiuria Quad PF) (Completed)   Other Visit Diagnoses     Need for shingles vaccine       Relevant Orders   Varicella-zoster vaccine IM (Completed)   Acute midline low back pain without sciatica       Relevant Medications   cyclobenzaprine (FLEXERIL) 10 MG tablet   meloxicam (MOBIC) 15 MG tablet   predniSONE (DELTASONE) 20 MG tablet   Left varicocele       Relevant Orders   Ambulatory referral to Urology   Drug abuse and dependence [facility])       Relevant Orders   Ambulatory referral to Psychiatry        Return for chonic disease management.      Vonna Kotyk, FNP, have reviewed all documentation for this visit. The documentation on 05/03/21 for the exam, diagnosis, procedures, and orders are all accurate and complete.    Gwyneth Sprout, [place] [phone]  (phone) [phone] (fax)  [place]

## 2021-05-04 ENCOUNTER — Telehealth (HOSPITAL_COMMUNITY): Payer: Self-pay | Admitting: *Deleted

## 2021-05-04 ENCOUNTER — Telehealth: Payer: Self-pay
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
## 2021-05-04 NOTE — Telephone Encounter (Signed)
Copied from [place] [id number]. Topic: Referral - Status >> May 04, 2021 10:36 AM Alanda Slim E wrote: Reason for CRM:  [facility] called to let office know they have received a referral but dont have any phychiatrist taking new patients / please advise

## 2021-05-04 NOTE — Telephone Encounter (Signed)
Keith Gibbs, resident out of the office this week. In her absence Dr Dwyane Dee was consulted and she directed him to stop all of his medicines, start back on fri the 14 th taking Seroquel 50 mg and to see Keith Gibbs on Monday. He verbalized his understanding.

## 2021-05-07 ENCOUNTER — Other Ambulatory Visit: Payer: Self-pay | Admitting: Family Medicine

## 2021-05-07 ENCOUNTER — Other Ambulatory Visit: Payer: Self-pay

## 2021-05-07 ENCOUNTER — Telehealth: Payer: Self-pay | Admitting: Family Medicine

## 2021-05-07 DIAGNOSIS — G2581 Restless legs syndrome: Secondary | ICD-10-CM | POA: Insufficient documentation

## 2021-05-07 DIAGNOSIS — F1920 Other psychoactive substance dependence, uncomplicated: Secondary | ICD-10-CM

## 2021-05-07 MED ORDER — ROPINIROLE HCL 2 MG PO TABS: 2.0000 mg | ORAL_TABLET | Freq: Every day | ORAL | 0 refills | Status: DC

## 2021-05-07 NOTE — Telephone Encounter (Signed)
Pt called and stated that patient was seen in office. Provider stated that they would send something in for restless leg syndrome. Pt would like something called in. Please advise.

## 2021-05-07 NOTE — Telephone Encounter (Signed)
Left message on pt's vm advising below.

## 2021-05-07 NOTE — Telephone Encounter (Signed)
Can we please send the referral elsewhere then?

## 2021-05-10 ENCOUNTER — Encounter (HOSPITAL_COMMUNITY): Payer: Medicaid Other | Admitting: Student in an Organized Health Care Education/Training Program

## 2021-05-10 ENCOUNTER — Other Ambulatory Visit: Payer: Self-pay

## 2021-05-10 ENCOUNTER — Ambulatory Visit (HOSPITAL_COMMUNITY): Payer: Medicaid Other | Admitting: Licensed Clinical Social Worker

## 2021-05-13 ENCOUNTER — Ambulatory Visit (HOSPITAL_COMMUNITY): Payer: Medicaid Other | Admitting: Licensed Clinical Social Worker

## 2021-05-17 NOTE — Progress Notes (Signed)
05/18/21 11:16 AM   Keith Gibbs 10-06-1960 [id number]  Referring provider:  Gwyneth Sprout, [address] [place],  [place] [address] Chief Complaint  Patient presents with   varicocele     HPI: Keith Gibbs is a 60 y.o.male with a personal history of left varicocele, right groin pain, chronic testicular pain, substance abuse, herpes, and penile lesion, who presents for further evaluation of left variocele.   His most recent scrotal ultrasound on 12/13/2019 revealed left variocele. Scrotal exam on 01/01/2020 in office was normal and reassuring.   He has been having dysuria on and off for a few days. UA today is negative.   He reports today that he has been doing heavy lifting recently and believe he has pulled something. He feels a pull when he urinates, and has a good stream and flow. He is most bothersome by cramps when he urinates. He reports that he has been going to a psychiatrist and wonders if he symptoms are because of new medication that he has been placed on.   He reports that he has been experiencing discomfort when having a bowel movement.   He reports tenderness in his scrotum on both sides.   PMH: Past Medical History:  Diagnosis Date   Anginal pain ([place])    Anxiety    Arthritis    Bipolar 1 disorder (HCC)    Bursitis    CAD (coronary artery disease)    Chronic pain    COPD (chronic obstructive pulmonary disease) (HCC)    Current use of long term anticoagulation    DAPT (ASA + clopidogrel)   Depression    Diverticulitis    Dyspnea    GERD (gastroesophageal reflux disease)    Grade I diastolic dysfunction    Hepatitis C 2012   No longer has Hep C   HLD (hyperlipidemia)    Hypertension    MI (myocardial infarction) ([place])    Polysubstance abuse (HCC)    cocaine, marijuana, ETOH   PUD (peptic ulcer disease)    S/P angioplasty with stent 06/10/2016   a.) 90% stenosis of pLAD to mLAD - 2.5 x 18 mm Xience Alpine (DES x 1) placed to pLAD    S/P PTCA (percutaneous transluminal coronary angioplasty) 12/04/2019   a.) 60% in stent restenosis of DES to pLAD; LVEF 65%.   Schizophrenia ([place])    Stroke [facility])    Valvular insufficiency    a.) Mild MR, TR, PR; mild to moderate AR on 03/05/2018 TTE    Surgical History: Past Surgical History:  Procedure Laterality Date   ABDOMINAL SURGERY     removed small piece of intestines due to [facility] Diverticulosis   APPENDECTOMY     BACK SURGERY     CARDIAC CATHETERIZATION Left 06/10/2016   Procedure: Left Heart Cath and Coronary Angiography;  Surgeon: Dionisio David, MD;  Location: [place] [facility];  Service: Cardiovascular;  Laterality: Left;   CARDIAC CATHETERIZATION N/A 06/10/2016   Procedure: Coronary Stent Intervention;  Surgeon: Yolonda Kida, MD;  Location: [place] [facility];  Service: Cardiovascular;  Laterality: N/A;   COLON SURGERY     COLONOSCOPY     COLONOSCOPY WITH PROPOFOL N/A 01/05/2017   Procedure: COLONOSCOPY WITH PROPOFOL;  Surgeon: Jonathon Bellows, MD;  Location: [facility] ENDOSCOPY;  Service: Endoscopy;  Laterality: N/A;   COLONOSCOPY WITH PROPOFOL N/A 02/13/2020   Procedure: COLONOSCOPY WITH PROPOFOL;  Surgeon: Virgel Manifold, MD;  Location: [facility] ENDOSCOPY;  Service: Endoscopy;  Laterality: N/A;   CORONARY ANGIOPLASTY WITH STENT PLACEMENT     ESOPHAGOGASTRODUODENOSCOPY (EGD) WITH PROPOFOL N/A 01/05/2017   Procedure: ESOPHAGOGASTRODUODENOSCOPY (EGD) WITH PROPOFOL;  Surgeon: Jonathon Bellows, MD;  Location: [facility] ENDOSCOPY;  Service: Endoscopy;  Laterality: N/A;   ESOPHAGOGASTRODUODENOSCOPY (EGD) WITH PROPOFOL N/A 02/13/2020   Procedure: ESOPHAGOGASTRODUODENOSCOPY (EGD) WITH PROPOFOL;  Surgeon: Virgel Manifold, MD;  Location: [facility] ENDOSCOPY;  Service: Endoscopy;  Laterality: N/A;   INTRAVASCULAR PRESSURE WIRE/FFR STUDY N/A 12/04/2019   Procedure: INTRAVASCULAR PRESSURE WIRE/FFR STUDY;  Surgeon: Sherren Mocha, MD;  Location: [place] [facility];  Service:  Cardiovascular;  Laterality: N/A;   KNEE ARTHROSCOPY WITH MEDIAL MENISECTOMY Right 09/05/2017   Procedure: KNEE ARTHROSCOPY WITH MEDIAL AND LATERAL  MENISECTOMY PARTIAL SYNOVECTOMY;  Surgeon: Hessie Knows, MD;  Location: [facility] ORS;  Service: Orthopedics;  Laterality: Right;   LEFT HEART CATH AND CORONARY ANGIOGRAPHY N/A 12/04/2019   Procedure: LEFT HEART CATH AND CORONARY ANGIOGRAPHY;  Surgeon: Sherren Mocha, MD;  Location: [place] [facility];  Service: Cardiovascular;  Laterality: N/A;   SHOULDER SURGERY Right 04/09/2012   SPINE SURGERY     TOTAL KNEE ARTHROPLASTY Right 01/19/2021   Procedure: TOTAL KNEE ARTHROPLASTY - Rachelle Hora to Assist;  Surgeon: Hessie Knows, MD;  Location: [facility] ORS;  Service: Orthopedics;  Laterality: Right;    Home Medications:  Allergies as of 05/18/2021       Reactions   Asenapine Maleate    Latuda [lurasidone Hcl] Other (See Comments)   tremor   Lurasidone    Naproxen Hives, Swelling   Pt denies.   Saphris [asenapine] Other (See Comments)   Increased tremors        Medication List        Accurate as of May 18, 2021 11:16 AM. If you have any questions, ask your nurse or doctor.          STOP taking these medications    buPROPion 300 MG 24 hr tablet Commonly known as: Wellbutrin XL Stopped by: Hollice Espy, MD   Oxycodone HCl 10 MG Tabs Stopped by: Hollice Espy, MD   predniSONE 20 MG tablet Commonly known as: DELTASONE Stopped by: Hollice Espy, MD   traMADol 50 MG tablet Commonly known as: ULTRAM Stopped by: Hollice Espy, MD   traZODone 100 MG tablet Commonly known as: DESYREL Stopped by: Hollice Espy, MD       TAKE these medications    acetaminophen 500 MG tablet Commonly known as: TYLENOL Take 2 tablets (1,000 mg total) by mouth every 6 (six) hours.   albuterol 108 (90 Base) MCG/ACT inhaler Commonly known as: VENTOLIN HFA Inhale 2 puffs into the lungs every 6 (six) hours as needed for wheezing or  shortness of breath.   aspirin EC 81 MG tablet Take 81 mg by mouth daily.   atorvastatin 80 MG tablet Commonly known as: LIPITOR Take 80 mg by mouth daily.   Breztri Aerosphere 160-9-4.8 MCG/ACT Aero Generic drug: Budeson-Glycopyrrol-Formoterol Inhale 2 puffs into the lungs 2 (two) times daily.   cetirizine 10 MG tablet Commonly known as: ZYRTEC TAKE 1 TABLET BY MOUTH EVERY DAY   clopidogrel 75 MG tablet Commonly known as: PLAVIX Take 75 mg by mouth daily.   cyclobenzaprine 10 MG tablet Commonly known as: FLEXERIL Take 1 tablet (10 mg total) by mouth 3 (three) times daily.   divalproex 250 MG DR tablet Commonly known as: Depakote Take 1 tablet (250 mg total) by mouth 2 (two) times daily.   docusate sodium 100 MG  capsule Commonly known as: COLACE Take 1 capsule (100 mg total) by mouth 2 (two) times daily.   DULoxetine 60 MG capsule Commonly known as: CYMBALTA TAKE 1 CAPSULE BY MOUTH EVERY DAY   enalapril 2.5 MG tablet Commonly known as: VASOTEC Take 2.5 mg by mouth daily.   Fish Oil 1200 MG Caps Take by mouth.   isosorbide mononitrate 60 MG 24 hr tablet Commonly known as: IMDUR Take 1 tablet (60 mg total) by mouth daily.   meloxicam 15 MG tablet Commonly known as: MOBIC Take 1 tablet (15 mg total) by mouth daily.   multivitamin tablet Take 1 tablet by mouth daily.   nitroGLYCERIN 0.4 MG SL tablet Commonly known as: NITROSTAT Place 1 tablet (0.4 mg total) under the tongue every 5 (five) minutes x 3 doses as needed for chest pain.   nitroGLYCERIN 0.3 MG SL tablet Commonly known as: NITROSTAT   omeprazole 40 MG capsule Commonly known as: PRILOSEC TAKE 1 CAPSULE (40 MG TOTAL) BY MOUTH DAILY.   QUEtiapine 100 MG tablet Commonly known as: SEROQUEL Take 1 tablet (100 mg total) by mouth at bedtime.   rOPINIRole 2 MG tablet Commonly known as: REQUIP Take 1 tablet (2 mg total) by mouth at bedtime.   tamsulosin 0.4 MG Caps capsule Commonly known as:  FLOMAX TAKE 1 CAPSULE BY MOUTH EVERY DAY        Allergies:  Allergies  Allergen Reactions   Asenapine Maleate    Latuda [Lurasidone Hcl] Other (See Comments)    tremor    Lurasidone    Naproxen Hives and Swelling    Pt denies.   Saphris [Asenapine] Other (See Comments)    Increased tremors     Family History: Family History  Problem Relation Age of Onset   Osteoarthritis Mother    Heart disease Mother    Hypertension Mother    Depression Mother    Heart disease Father    Early death Father    Hypertension Father    Heart attack Father    Hypertension Sister    Prostate cancer Neg Hx    Bladder Cancer Neg Hx    Kidney cancer Neg Hx     Social History:  reports that he has been smoking cigarettes. He has a 16.00 pack-year smoking history. He has never used smokeless tobacco. He reports current alcohol use of about 6.0 standard drinks per week. He reports current drug use. Drugs: Cocaine and Marijuana.   Physical Exam: BP 111/62   Pulse (!) 52   Ht 6\' 3"  (1.905 m)   Wt 206 lb (93.4 kg)   BMI 25.75 kg/m   Constitutional:  Alert and oriented, No acute distress. HEENT: [place] AT, moist mucus membranes.  Trachea midline, no masses. Cardiovascular: No clubbing, cyanosis, or edema. Respiratory: Normal respiratory effort, no increased work of breathing. GU: Normal circumcised phallus, bilateral decended testicles, mildly tenderness of right epididymis which otherwise unremarkable  Skin: No rashes, bruises or suspicious lesions. Neurologic: Grossly intact, no focal deficits, moving all 4 extremities. Psychiatric: Normal mood and affect.  Laboratory Data:  Lab Results  Component Value Date   CREATININE 0.74 (L) 04/06/2021    Lab Results  Component Value Date   HGBA1C 5.2 12/28/2016    Urinalysis Negative   Assessment & Plan:   Right epididymitis  -  Suspect this is likely inflammatory rather than infectious; exam today is fairly benign - Discussed scrotal  support with tight undergarments .  - Continue meloxicam and avoid  heavy lifting -Returns if fails to improve  2. Pelvic pain  - Unclear cramping sensation  - No evidence of UTI or other contributing symptoms   Follow-up as needed  I,Kailey Littlejohn,acting as a scribe for Hollice Espy, MD.,have documented all relevant documentation on the behalf of Hollice Espy, MD,as directed by  Hollice Espy, MD while in the presence of Hollice Espy, MD.  I have reviewed the above documentation for accuracy and completeness, and I agree with the above.   Hollice Espy, MD   [facility] [address], [place], [place] [address] [phone]

## 2021-05-18 ENCOUNTER — Ambulatory Visit (INDEPENDENT_AMBULATORY_CARE_PROVIDER_SITE_OTHER): Payer: Medicaid Other | Admitting: Urology

## 2021-05-18 ENCOUNTER — Other Ambulatory Visit: Payer: Self-pay

## 2021-05-18 VITALS — BP 111/62 | HR 52 | Ht 75.0 in | Wt 206.0 lb

## 2021-05-18 DIAGNOSIS — I861 Scrotal varices: Secondary | ICD-10-CM

## 2021-05-18 LAB — URINALYSIS, COMPLETE
Bilirubin, UA: NEGATIVE
Glucose, UA: NEGATIVE
Ketones, UA: NEGATIVE
Leukocytes,UA: NEGATIVE
Nitrite, UA: NEGATIVE
Protein,UA: NEGATIVE
Specific Gravity, UA: 1.02 (ref 1.005–1.030)
Urobilinogen, Ur: 0.2 mg/dL (ref 0.2–1.0)
pH, UA: 7 (ref 5.0–7.5)

## 2021-05-31 ENCOUNTER — Emergency Department
Admission: EM | Admit: 2021-05-31 | Discharge: 2021-05-31 | Disposition: A | Discharge status: Home / Self Care | Payer: Medicaid Other | Attending: Emergency Medicine | Admitting: Emergency Medicine

## 2021-05-31 ENCOUNTER — Other Ambulatory Visit: Payer: Self-pay

## 2021-05-31 ENCOUNTER — Emergency Department: Payer: Medicaid Other

## 2021-05-31 ENCOUNTER — Encounter (HOSPITAL_COMMUNITY): Payer: Medicaid Other | Admitting: Student in an Organized Health Care Education/Training Program

## 2021-05-31 DIAGNOSIS — Z96651 Presence of right artificial knee joint: Secondary | ICD-10-CM | POA: Insufficient documentation

## 2021-05-31 DIAGNOSIS — Z7982 Long term (current) use of aspirin: Secondary | ICD-10-CM | POA: Insufficient documentation

## 2021-05-31 DIAGNOSIS — Z20822 Contact with and (suspected) exposure to covid-19: Secondary | ICD-10-CM | POA: Insufficient documentation

## 2021-05-31 DIAGNOSIS — F17210 Nicotine dependence, cigarettes, uncomplicated: Secondary | ICD-10-CM | POA: Diagnosis not present

## 2021-05-31 DIAGNOSIS — I2510 Atherosclerotic heart disease of native coronary artery without angina pectoris: Secondary | ICD-10-CM | POA: Diagnosis not present

## 2021-05-31 DIAGNOSIS — Z7901 Long term (current) use of anticoagulants: Secondary | ICD-10-CM | POA: Insufficient documentation

## 2021-05-31 DIAGNOSIS — Z79899 Other long term (current) drug therapy: Secondary | ICD-10-CM | POA: Diagnosis not present

## 2021-05-31 DIAGNOSIS — F1190 Opioid use, unspecified, uncomplicated: Secondary | ICD-10-CM | POA: Insufficient documentation

## 2021-05-31 DIAGNOSIS — I10 Essential (primary) hypertension: Secondary | ICD-10-CM | POA: Diagnosis not present

## 2021-05-31 DIAGNOSIS — R0602 Shortness of breath: Secondary | ICD-10-CM | POA: Diagnosis present

## 2021-05-31 DIAGNOSIS — J441 Chronic obstructive pulmonary disease with (acute) exacerbation: Secondary | ICD-10-CM | POA: Insufficient documentation

## 2021-05-31 DIAGNOSIS — Z7902 Long term (current) use of antithrombotics/antiplatelets: Secondary | ICD-10-CM | POA: Insufficient documentation

## 2021-05-31 DIAGNOSIS — R42 Dizziness and giddiness: Secondary | ICD-10-CM | POA: Insufficient documentation

## 2021-05-31 LAB — URINALYSIS, ROUTINE W REFLEX MICROSCOPIC
Bilirubin Urine: NEGATIVE
Glucose, UA: NEGATIVE mg/dL
Hgb urine dipstick: NEGATIVE
Ketones, ur: 5 mg/dL — AB
Leukocytes,Ua: NEGATIVE
Nitrite: NEGATIVE
Protein, ur: NEGATIVE mg/dL
Specific Gravity, Urine: 1.033 — ABNORMAL HIGH (ref 1.005–1.030)
pH: 5 (ref 5.0–8.0)

## 2021-05-31 LAB — HEPATIC FUNCTION PANEL
ALT: 16 U/L (ref 0–44)
AST: 23 U/L (ref 15–41)
Albumin: 3.7 g/dL (ref 3.5–5.0)
Alkaline Phosphatase: 63 U/L (ref 38–126)
Bilirubin, Direct: <0.1 mg/dL (ref 0.0–0.2)
Total Bilirubin: 1 mg/dL (ref 0.3–1.2)
Total Protein: 6.9 g/dL (ref 6.5–8.1)

## 2021-05-31 LAB — CBC
HCT: 37.2 % — ABNORMAL LOW (ref 39.0–52.0)
Hemoglobin: 12.6 g/dL — ABNORMAL LOW (ref 13.0–17.0)
MCH: 31 pg (ref 26.0–34.0)
MCHC: 33.9 g/dL (ref 30.0–36.0)
MCV: 91.4 fL (ref 80.0–100.0)
Platelets: 236 10*3/uL (ref 150–400)
RBC: 4.07 MIL/uL — ABNORMAL LOW (ref 4.22–5.81)
RDW: 14.5 % (ref 11.5–15.5)
WBC: 9.3 10*3/uL (ref 4.0–10.5)
nRBC: 0 % (ref 0.0–0.2)

## 2021-05-31 LAB — BASIC METABOLIC PANEL
Anion gap: 5 (ref 5–15)
BUN: 19 mg/dL (ref 6–20)
CO2: 28 mmol/L (ref 22–32)
Calcium: 8.4 mg/dL — ABNORMAL LOW (ref 8.9–10.3)
Chloride: 95 mmol/L — ABNORMAL LOW (ref 98–111)
Creatinine, Ser: 0.86 mg/dL (ref 0.61–1.24)
GFR, Estimated: >60 mL/min (ref >60)
Glucose, Bld: 84 mg/dL (ref 70–99)
Potassium: 3.3 mmol/L — ABNORMAL LOW (ref 3.5–5.1)
Sodium: 128 mmol/L — ABNORMAL LOW (ref 135–145)

## 2021-05-31 LAB — TROPONIN I (HIGH SENSITIVITY)
Troponin I (High Sensitivity): 5 ng/L (ref <18)
Troponin I (High Sensitivity): 4 ng/L (ref <18)

## 2021-05-31 LAB — RESP PANEL BY RT-PCR (FLU A&B, COVID) ARPGX2
Influenza A by PCR: NEGATIVE
Influenza B by PCR: NEGATIVE
SARS Coronavirus 2 by RT PCR: NEGATIVE

## 2021-05-31 MED ORDER — PREDNISONE 50 MG PO TABS: ORAL_TABLET | ORAL | 0 refills | Status: DC

## 2021-05-31 MED ORDER — PREDNISONE 20 MG PO TABS
60.0000 mg | ORAL_TABLET | Freq: Once | ORAL | Status: AC
  Administered 2021-05-31: 60 mg via ORAL
  Filled 2021-05-31: qty 3

## 2021-05-31 MED ORDER — BUPRENORPHINE HCL-NALOXONE HCL 8-2 MG SL SUBL
1.0000 | SUBLINGUAL_TABLET | Freq: Once | SUBLINGUAL | Status: AC
  Administered 2021-05-31: 1 via SUBLINGUAL
  Filled 2021-05-31: qty 1

## 2021-05-31 MED ORDER — BUPRENORPHINE HCL-NALOXONE HCL 8-2 MG SL FILM
8.0000 mg | ORAL_FILM | Freq: Every day | SUBLINGUAL | 0 refills | Status: DC
Start: 2021-05-31 — End: 2021-06-01

## 2021-05-31 MED ORDER — IPRATROPIUM-ALBUTEROL 0.5-2.5 (3) MG/3ML IN SOLN
3.0000 mL | Freq: Once | RESPIRATORY_TRACT | Status: AC
  Administered 2021-05-31: 3 mL via RESPIRATORY_TRACT
  Filled 2021-05-31: qty 3

## 2021-05-31 NOTE — ED Notes (Signed)
Pt admitted in triage to crystal meth, heroin, marijuana use yesterday.

## 2021-05-31 NOTE — ED Triage Notes (Signed)
Pt to ED for shob, generalized weakness, dizziness that started at 1700 yesterday. Reports numbness to left hand from last night. Equal grip and strength, tremor noted that pt states is chronic.  Pt alert and oriented at this time, clear speech. NAD noted, RR Even and unlabored   Pt states "im either having a stroke or a heart attack"

## 2021-05-31 NOTE — Discharge Instructions (Signed)
You are likely having a COPD exacerbation.  Please take the prednisone once daily for the next 5 days.  If you have worsening shortness of breath please return to the emergency department.  Have written you for the first 3 days of Suboxone to help with your opiate withdrawal.  Please take 1 film daily for the next 3 days.  Please follow-up with [facility] for an additional prescription of this medication.

## 2021-05-31 NOTE — ED Provider Notes (Signed)
[facility]  ____________________________________________   Event Date/Time   First MD Initiated Contact with Patient 05/31/21 1557     (approximate)  I have reviewed the triage vital signs and the nursing notes.   HISTORY  Chief Complaint Shortness of Breath    HPI Keith Gibbs is a 60 y.o. male bipolar disorder, depression, COPD, CAD s/p stenting who presents with shortness of breath and lightheadedness. Patient notes that last night he felt sudden onset sudden onset of shortness of breath, numbness over his whole body and feeling very dizzy and anxious.  This lasted for about 45 minutes and then resolved.  He does endorse some ongoing shortness of breath and cough but denies any chest pain.  Patient does endorse using meth and heroin yesterday which he does frequently.  Uses heroin daily last was at 10 AM yesterday.  He denies associated nausea vomiting diarrhea or abdominal pain.  No fevers or chills.  Patient has been anxious since the episode as he is concerned about his health.  He endorses increased tremulousness from baseline as well as feeling dizzy and lightheaded with standing.  Denies any focal numbness or weakness or visual change.  Her been on methadone or Suboxone.  Denies any history of alcohol or benzo abuse.       Past Medical History:  Diagnosis Date   Anginal pain ([place])    Anxiety    Arthritis    Bipolar 1 disorder (HCC)    Bursitis    CAD (coronary artery disease)    Chronic pain    COPD (chronic obstructive pulmonary disease) (HCC)    Current use of long term anticoagulation    DAPT (ASA + clopidogrel)   Depression    Diverticulitis    Dyspnea    GERD (gastroesophageal reflux disease)    Grade I diastolic dysfunction    Hepatitis C 2012   No longer has Hep C   HLD (hyperlipidemia)    Hypertension    MI (myocardial infarction) ([place])    Polysubstance abuse (HCC)    cocaine, marijuana, ETOH   PUD (peptic ulcer disease)     S/P angioplasty with stent 06/10/2016   a.) 90% stenosis of pLAD to mLAD - 2.5 x 18 mm Xience Alpine (DES x 1) placed to pLAD   S/P PTCA (percutaneous transluminal coronary angioplasty) 12/04/2019   a.) 60% in stent restenosis of DES to pLAD; LVEF 65%.   Schizophrenia ([place])    Stroke [facility])    Valvular insufficiency    a.) Mild MR, TR, PR; mild to moderate AR on 03/05/2018 TTE    Patient Active Problem List   Diagnosis Date Noted   RLS (restless legs syndrome) 05/07/2021   Acquired deviated nasal septum 05/03/2021   Encounter for surveillance of abnormal nevi 05/03/2021   Flu vaccine need 05/03/2021   Substance abuse ([place]) 05/03/2021   Drug abuse and dependence ([place]) 05/03/2021   Acute midline low back pain without sciatica 05/03/2021   Need for shingles vaccine 05/03/2021   Right groin pain 01/29/2021   S/P TKR (total knee replacement) using cement, right 01/19/2021   Cigarette smoker 07/21/2020   Foot pain, bilateral 12/25/2019   Potential exposure to STD 12/12/2019   History of lumbar surgery 12/12/2019   Fatigue 12/12/2019   Penile lesion 12/12/2019   Right testicular pain 12/12/2019   Body mass index 28.0-28.9, adult 12/12/2019   History of COPD 12/12/2019   Tobacco use disorder, continuous 12/12/2019   Hepatoma (  [place]) 11/05/2019   Anxiety 12/10/2018   Unstable angina ([place]) 11/30/2018   Essential hypertension 09/18/2018   Heart valve disorder 09/18/2018   Hypercholesteremia 09/18/2018   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   Chronic anticoagulation (Plavix) 04/19/2018   Pain medication agreement broken ([place]) 04/19/2018   Cocaine use 04/18/2018   Cocaine abuse ([place]) (See 04/12/18 UDS) 04/18/2018   Marijuana abuse (See 04/12/18 UDS) 04/18/2018   Long term current use of opiate analgesic 04/12/2018   Cirrhosis of liver ([place]) 04/03/2018   BPH (benign prostatic hyperplasia) 04/03/2018   Hematuria, microscopic 04/03/2018   Osteoarthritis of the knee (Left)  11/02/2017   Lumbar facet syndrome (Bilateral) 11/01/2017   Spondylosis without myelopathy or radiculopathy, lumbosacral region 11/01/2017   Osteoarthritis of shoulder (Right) 11/01/2017   Osteoarthritis of acromioclavicular joint (Right) 11/01/2017   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) 11/01/2017   Osteoarthritis 11/01/2017   Cervicalgia 11/01/2017   Cervical facet syndrome 11/01/2017   DDD (degenerative disc disease), cervical 11/01/2017   DDD (degenerative disc disease), lumbar 11/01/2017   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) 09/18/2017   Other chronic pain 09/18/2017   Chronic musculoskeletal pain 09/18/2017   Chronic pain of right knee 09/11/2017   Chronic low back pain (Secondary Area of Pain) (Bilateral) 09/11/2017   Chronic hip pain (Tertiary Area of Pain) (Bilateral) (R>L) 09/11/2017   Chronic shoulder pain (Fourth Area of Pain) (Right) 09/11/2017   Spondylosis without myelopathy or radiculopathy, cervical region 09/11/2017   Chronic pain syndrome 09/11/2017   Opiate use 09/11/2017   Screen for STD (sexually transmitted disease) 09/11/2017   Disorder of skeletal system 09/11/2017   Problems influencing health status 09/11/2017   Overdose of medication, intentional self-harm, sequela ([place]) 12/28/2016   Severe recurrent major depression without psychotic features ([place]) 12/27/2016   Coronary artery disease of native artery of native heart with stable angina pectoris ([place]) 12/27/2016   Alcohol use disorder, moderate, dependence ([place]) 12/27/2016   Cannabis use disorder, moderate, dependence ([place]) 12/27/2016   Angina at rest [facility]) 06/11/2016   Chest pain 06/10/2016   Bipolar 1 disorder ([place]) 05/27/2013   Abnormal ejaculation 11/30/2012   Chest pain, unspecified 11/30/2012   Degenerative arthritis of hip 11/16/2012   Schizoaffective disorder ([place]) 06/27/2012   COPD GOLD ? / still smoking  06/27/2012   Chronic hepatitis C ([place]) 06/27/2012   GERD  (gastroesophageal reflux disease) 06/27/2012   Complete rupture of rotator cuff 02/10/2012   Cocaine abuse in remission ([place]) 12/05/2011   Unspecified osteoarthritis, unspecified site 07/27/2011    Past Surgical History:  Procedure Laterality Date   ABDOMINAL SURGERY     removed small piece of intestines due to [facility] Diverticulosis   APPENDECTOMY     BACK SURGERY     CARDIAC CATHETERIZATION Left 06/10/2016   Procedure: Left Heart Cath and Coronary Angiography;  Surgeon: Dionisio David, MD;  Location: [place] CV LAB;  Service: Cardiovascular;  Laterality: Left;   CARDIAC CATHETERIZATION N/A 06/10/2016   Procedure: Coronary Stent Intervention;  Surgeon: Yolonda Kida, MD;  Location: [place] CV LAB;  Service: Cardiovascular;  Laterality: N/A;   COLON SURGERY     COLONOSCOPY     COLONOSCOPY WITH PROPOFOL N/A 01/05/2017   Procedure: COLONOSCOPY WITH PROPOFOL;  Surgeon: Jonathon Bellows, MD;  Location: [facility] ENDOSCOPY;  Service: Endoscopy;  Laterality: N/A;   COLONOSCOPY WITH PROPOFOL N/A 02/13/2020   Procedure: COLONOSCOPY WITH PROPOFOL;  Surgeon: Virgel Manifold, MD;  Location: [facility] ENDOSCOPY;  Service: Endoscopy;  Laterality: N/A;   CORONARY ANGIOPLASTY WITH STENT PLACEMENT     ESOPHAGOGASTRODUODENOSCOPY (EGD) WITH PROPOFOL N/A 01/05/2017   Procedure: ESOPHAGOGASTRODUODENOSCOPY (EGD) WITH PROPOFOL;  Surgeon: Jonathon Bellows, MD;  Location: [facility] ENDOSCOPY;  Service: Endoscopy;  Laterality: N/A;   ESOPHAGOGASTRODUODENOSCOPY (EGD) WITH PROPOFOL N/A 02/13/2020   Procedure: ESOPHAGOGASTRODUODENOSCOPY (EGD) WITH PROPOFOL;  Surgeon: Virgel Manifold, MD;  Location: [facility] ENDOSCOPY;  Service: Endoscopy;  Laterality: N/A;   INTRAVASCULAR PRESSURE WIRE/FFR STUDY N/A 12/04/2019   Procedure: INTRAVASCULAR PRESSURE WIRE/FFR STUDY;  Surgeon: Sherren Mocha, MD;  Location: [place] [facility] LAB;  Service: Cardiovascular;  Laterality: N/A;   KNEE ARTHROSCOPY WITH MEDIAL MENISECTOMY Right  09/05/2017   Procedure: KNEE ARTHROSCOPY WITH MEDIAL AND LATERAL  MENISECTOMY PARTIAL SYNOVECTOMY;  Surgeon: Hessie Knows, MD;  Location: [facility] ORS;  Service: Orthopedics;  Laterality: Right;   LEFT HEART CATH AND CORONARY ANGIOGRAPHY N/A 12/04/2019   Procedure: LEFT HEART CATH AND CORONARY ANGIOGRAPHY;  Surgeon: Sherren Mocha, MD;  Location: [place] CV LAB;  Service: Cardiovascular;  Laterality: N/A;   SHOULDER SURGERY Right 04/09/2012   SPINE SURGERY     TOTAL KNEE ARTHROPLASTY Right 01/19/2021   Procedure: TOTAL KNEE ARTHROPLASTY - Rachelle Hora to Assist;  Surgeon: Hessie Knows, MD;  Location: [facility] ORS;  Service: Orthopedics;  Laterality: Right;    Prior to Admission medications   Medication Sig Start Date End Date Taking? Authorizing Provider  Buprenorphine HCl-Naloxone HCl 8-2 MG FILM Place 1 Film under the tongue daily for 3 days. 05/31/21 06/03/21 Yes Rada Hay, MD  predniSONE (DELTASONE) 50 MG tablet Take once daily for the next 5 days 05/31/21  Yes Rada Hay, MD  acetaminophen (TYLENOL) 500 MG tablet Take 2 tablets (1,000 mg total) by mouth every 6 (six) hours. 01/20/21   Duanne Guess, PA-C  albuterol (VENTOLIN HFA) 108 (90 Base) MCG/ACT inhaler Inhale 2 puffs into the lungs every 6 (six) hours as needed for wheezing or shortness of breath. 05/28/19   Charlott Rakes, MD  aspirin EC 81 MG tablet Take 81 mg by mouth daily.    [provider]  atorvastatin (LIPITOR) 80 MG tablet Take 80 mg by mouth daily. 08/28/20   [provider]  Budeson-Glycopyrrol-Formoterol (BREZTRI AEROSPHERE) 160-9-4.8 MCG/ACT AERO Inhale 2 puffs into the lungs 2 (two) times daily. 08/06/20   Tanda Rockers, MD  cetirizine (ZYRTEC) 10 MG tablet TAKE 1 TABLET BY MOUTH EVERY DAY 02/02/21   Virginia Crews, MD  clopidogrel (PLAVIX) 75 MG tablet Take 75 mg by mouth daily.    [provider]  cyclobenzaprine (FLEXERIL) 10 MG tablet Take 1 tablet (10 mg total) by  mouth 3 (three) times daily. 05/03/21   Gwyneth Sprout, FNP  divalproex (DEPAKOTE) 250 MG DR tablet Take 1 tablet (250 mg total) by mouth 2 (two) times daily. 04/26/21 04/26/22  Freida Busman, MD  docusate sodium (COLACE) 100 MG capsule Take 1 capsule (100 mg total) by mouth 2 (two) times daily. 01/20/21   Duanne Guess, PA-C  DULoxetine (CYMBALTA) 60 MG capsule TAKE 1 CAPSULE BY MOUTH EVERY DAY 02/02/21   Virginia Crews, MD  enalapril (VASOTEC) 2.5 MG tablet Take 2.5 mg by mouth daily.    [provider]  isosorbide mononitrate (IMDUR) 60 MG 24 hr tablet Take 1 tablet (60 mg total) by mouth daily. 12/04/19   Sherren Mocha, MD  meloxicam (MOBIC) 15 MG tablet Take 1 tablet (15 mg total) by mouth daily. 05/03/21  Gwyneth Sprout, FNP  Multiple Vitamin (MULTIVITAMIN) tablet Take 1 tablet by mouth daily.    [provider]  nitroGLYCERIN (NITROSTAT) 0.3 MG SL tablet  12/01/20   [provider]  nitroGLYCERIN (NITROSTAT) 0.4 MG SL tablet Place 1 tablet (0.4 mg total) under the tongue every 5 (five) minutes x 3 doses as needed for chest pain. 12/10/18   Charlott Rakes, MD  Omega-3 Fatty Acids (FISH OIL) 1200 MG CAPS Take by mouth.    [provider]  omeprazole (PRILOSEC) 40 MG capsule TAKE 1 CAPSULE (40 MG TOTAL) BY MOUTH DAILY. 02/02/21   Virginia Crews, MD  QUEtiapine (SEROQUEL) 100 MG tablet Take 1 tablet (100 mg total) by mouth at bedtime. 04/26/21 04/26/22  Freida Busman, MD  rOPINIRole (REQUIP) 2 MG tablet Take 1 tablet (2 mg total) by mouth at bedtime. 05/07/21   Gwyneth Sprout, FNP  tamsulosin (FLOMAX) 0.4 MG CAPS capsule TAKE 1 CAPSULE BY MOUTH EVERY DAY 02/02/21   Bacigalupo, Dionne Bucy, MD    Allergies Asenapine maleate, Latuda [lurasidone hcl], Lurasidone, Naproxen, and Saphris [asenapine]  Family History  Problem Relation Age of Onset   Osteoarthritis Mother    Heart disease Mother    Hypertension Mother    Depression Mother    Heart  disease Father    Early death Father    Hypertension Father    Heart attack Father    Hypertension Sister    Prostate cancer Neg Hx    Bladder Cancer Neg Hx    Kidney cancer Neg Hx     Social History Social History   Tobacco Use   Smoking status: Every Day    Packs/day: 0.50    Years: 32.00    Pack years: 16.00    Types: Cigarettes   Smokeless tobacco: Never   Tobacco comments:    Continues to smoke approximately half a pack per day.  Vaping Use   Vaping Use: Never used  Substance Use Topics   Alcohol use: Yes    Alcohol/week: 6.0 standard drinks    Types: 6 Cans of beer per week    Comment: occassionally    Drug use: Yes    Types: Cocaine, Marijuana    Comment: last on saturday    Review of Systems   Review of Systems  Constitutional:  Negative for chills and fever.  Respiratory:  Positive for cough and shortness of breath.   Cardiovascular:  Negative for chest pain.  Gastrointestinal:  Negative for abdominal pain, nausea and vomiting.  Neurological:  Positive for tremors and light-headedness. Negative for headaches.  All other systems reviewed and are negative.  Physical Exam Updated Vital Signs BP 104/60 (BP Location: Right Arm)   Pulse 65   Temp (!) 97.5 F (36.4 C) (Oral)   Resp (!) 21   Ht 6\' 3"  (1.905 m)   Wt 92.5 kg   SpO2 99%   BMI 25.50 kg/m   Physical Exam Vitals and nursing note reviewed.  Constitutional:      General: He is not in acute distress.    Appearance: Normal appearance.  HENT:     Head: Normocephalic and atraumatic.  Eyes:     General: No scleral icterus.    Conjunctiva/sclera: Conjunctivae normal.  Cardiovascular:     Rate and Rhythm: Normal rate and regular rhythm.  Pulmonary:     Effort: Pulmonary effort is normal. No respiratory distress.     Breath sounds: Wheezing present.  Comments: Expiratory wheezing with good air movement Musculoskeletal:        General: No deformity or signs of injury.     Cervical back:  Normal range of motion.  Skin:    Coloration: Skin is not jaundiced or pale.  Neurological:     General: No focal deficit present.     Mental Status: He is alert and oriented to person, place, and time. Mental status is at baseline.     Comments: Aox3, nml speech  PERRL, EOMI, face symmetric, nml tongue movement  5/5 strength in the BL upper and lower extremities  Sensation grossly intact in the BL upper and lower extremities  Finger-nose-finger intact BL + Intention tremor Patient is somewhat tremulous, but able to ambulate with steady gate   Psychiatric:        Mood and Affect: Mood normal.        Behavior: Behavior normal.     LABS (all labs ordered are listed, but only abnormal results are displayed)  Labs Reviewed  BASIC METABOLIC PANEL - Abnormal; Notable for the following components:      Result Value   Sodium 128 (*)    Potassium 3.3 (*)    Chloride 95 (*)    Calcium 8.4 (*)    All other components within normal limits  CBC - Abnormal; Notable for the following components:   RBC 4.07 (*)    Hemoglobin 12.6 (*)    HCT 37.2 (*)    All other components within normal limits  URINALYSIS, ROUTINE W REFLEX MICROSCOPIC - Abnormal; Notable for the following components:   Color, Urine AMBER (*)    APPearance CLOUDY (*)    Specific Gravity, Urine 1.033 (*)    Ketones, ur 5 (*)    All other components within normal limits  RESP PANEL BY RT-PCR (FLU A&B, COVID) [id number]  HEPATIC FUNCTION PANEL  TROPONIN I (HIGH SENSITIVITY)  TROPONIN I (HIGH SENSITIVITY)   ____________________________________________  EKG  NSR, nml axis, nml intervals, no acute ischemic changes  ____________________________________________  RADIOLOGY Almeta Monas, personally viewed and evaluated these images (plain radiographs) as part of my medical decision making, as well as reviewing the written report by the radiologist.  ED MD interpretation:  I reviewed the CXR which does not show any  acute cardiopulmonary process      ____________________________________________   PROCEDURES  Procedure(s) performed (including Critical Care):  Procedures   ____________________________________________   INITIAL IMPRESSION / ASSESSMENT AND PLAN / ED COURSE  Patient is a 60 year old male with history of opiate use disorder and COPD who presents with lightheadedness and feeling generally unwell.  He had an episode last night of dyspnea and numbness and anxiety which sounds to me like potentially a panic attack.  This has made him very anxious and now would like to stop using opiates and meth.  He no longer has any dyspnea after that 45-minute period and never had any chest pain.  He does endorse some increased tremulousness although he has a tremor at baseline as well as some lightheadedness with standing.  He last used opiates yesterday as well as meth.  On exam his vital signs are stable, he overall appears well.  He does have an intention tremor.  His neurologic exam is nonfocal.  When standing up to walk he is tremulous but not grossly ataxic.  Some expiratory wheezing.  CT head was obtained from triage which is negative.  His chest x-ray does not show any infiltrate.  His EKG is nonischemic, troponins x2 are negative.  He is mildly hyponatremic but otherwise his labs are also reassuring.  He was given duo nebs and prednisone for his COPD.  I did give him a dose of Suboxone in the ED, he did not have any precipitated withdrawal.  He was discharged discharged with a 3-day supply.  He was referred to [facility] for ongoing prescription of Suboxone.  Patient lives with his mom who will monitor him as well.   ____________________________________________   FINAL CLINICAL IMPRESSION(S) / ED DIAGNOSES  Final diagnoses:  Opioid use disorder  COPD exacerbation [facility])     ED Discharge Orders          Ordered    Buprenorphine HCl-Naloxone HCl 8-2 MG FILM  Daily       Note to Pharmacy: Althea Grimmer:  [id number]   05/31/21 2033    predniSONE (DELTASONE) 50 MG tablet        05/31/21 2033             Note:  This document was prepared using Dragon voice recognition software and may include unintentional dictation errors.    Rada Hay, MD 05/31/21 2150

## 2021-06-01 ENCOUNTER — Ambulatory Visit: Payer: Self-pay

## 2021-06-01 ENCOUNTER — Telehealth: Payer: Self-pay | Admitting: Emergency Medicine

## 2021-06-01 MED ORDER — BUPRENORPHINE HCL-NALOXONE HCL 8-2 MG SL FILM: 8.0000 mg | ORAL_FILM | Freq: Every day | SUBLINGUAL | 0 refills | Status: AC

## 2021-06-01 NOTE — Telephone Encounter (Signed)
Rx sent for buprenorphine as previous prescriber not x-wavered.

## 2021-06-01 NOTE — Telephone Encounter (Signed)
Pt. Reports he was seen in ED yesterday."I had a bad panic attack. I'm a drug user and they told me to get Suboxone from [place], but they said they won't prescribe me any medications for 2 weeks after they see me. I have an appointment with them for evaluation next Wednesday." Requesting PCP to send suboxone to his pharmacy. States he only has 2 days worth of medication. He aslo needs an appointment "for my pain management." Please advise pt.     Answer Assessment - Initial Assessment Questions 1. NAME of MEDICATION: "What medicine are you calling about?"     Suboxone 2. QUESTION: "What is your question?" (e.g., double dose of medicine, side effect)     I need a prescription 3. PRESCRIBING HCP: "Who prescribed it?" Reason: if prescribed by specialist, call should be referred to that group.     ED 4. SYMPTOMS: "Do you have any symptoms?"     N/A 5. SEVERITY: If symptoms are present, ask "Are they mild, moderate or severe?"     N/A 6. PREGNANCY:  "Is there any chance that you are pregnant?" "When was your last menstrual period?"     N/A  Protocols used: Medication Question Call-A-AH

## 2021-06-02 NOTE — Telephone Encounter (Signed)
Hi Keith Gibbs please see message below, can we or do we assist with helping patients get this type of medication or is there resources of clinics he can call or look into for this Suboxene? KW

## 2021-06-03 ENCOUNTER — Ambulatory Visit: Payer: Self-pay | Admitting: *Deleted

## 2021-06-03 DIAGNOSIS — F1920 Other psychoactive substance dependence, uncomplicated: Secondary | ICD-10-CM

## 2021-06-03 NOTE — Telephone Encounter (Signed)
Pt would like a call back to advise if the dr is going to prescribe the suboxone.  But no one has called him back.  There is a conversation in the chart about this.   Pt states last strip for tonight. States "I really need this. I don't want to go down that road again."  Suboxone 3 day supply given in ED.  Assured pt message had been sent to SW for assist in obtaining med. Assured NT would alert practice to urgency. Pt verbalizes understanding.   Please advise:  CB# [phone]     Reason for Disposition  [1] Prescription refill request for ESSENTIAL medicine (i.e., likelihood of harm to patient if not taken) AND [2] triager unable to refill per department policy  Answer Assessment - Initial Assessment Questions 1. DRUG NAME: "What medicine do you need to have refilled?"     Suboxone 2. REFILLS REMAINING: "How many refills are remaining?" (Note: The label on the medicine or pill bottle will show how many refills are remaining. If there are no refills remaining, then a renewal may be needed.)     1 strip left 3. EXPIRATION DATE: "What is the expiration date?" (Note: The label states when the prescription will expire, and thus can no longer be refilled.)      4. PRESCRIBING HCP: "Who prescribed it?" Reason: If prescribed by specialist, call should be referred to that group.     Given 3 day supply in ED 5. SYMPTOMS: "Do you have any symptoms?"     Anxious regarding refill  Protocols used: Medication Refill and Renewal Call-A-AH

## 2021-06-03 NOTE — Telephone Encounter (Signed)
Referral made. Left message for patient with referral information.

## 2021-06-03 NOTE — Telephone Encounter (Signed)
Dr. Chucky May does not take medicaid. Patient advised. I did call [facility] and they did say that patient can walk in on Monday, Wednesday or Friday from 8-2 pm. Patient would have to establish first then attend one group meeting before seeing psychiatrist who would then prescribe Suboxone. They did advise that if patient is needing medication asap that he need to go to the ER. Patient reports he is also trying to schedule an appt with [facility].

## 2021-06-03 NOTE — Addendum Note (Signed)
Addended by: Shawna Orleans on: 06/03/2021 02:59 PM   Modules accepted: Orders

## 2021-06-04 ENCOUNTER — Other Ambulatory Visit (HOSPITAL_COMMUNITY): Payer: Self-pay | Admitting: Psychiatry

## 2021-06-04 ENCOUNTER — Other Ambulatory Visit: Payer: Self-pay | Admitting: Family Medicine

## 2021-06-04 DIAGNOSIS — F250 Schizoaffective disorder, bipolar type: Secondary | ICD-10-CM

## 2021-06-04 DIAGNOSIS — K219 Gastro-esophageal reflux disease without esophagitis: Secondary | ICD-10-CM

## 2021-06-04 DIAGNOSIS — M545 Low back pain, unspecified: Secondary | ICD-10-CM

## 2021-06-04 NOTE — Telephone Encounter (Signed)
Spoke with patient on the phone and he stated that [facility] told him there was nothing they can do for him, patient states that he waited outside [facility] till (pm last night and states that he returned again at [facility] this morning he states that they told him he would have to go through two weeks of classes before they would discuss medication options. Patient became tearful on the phone stating " I dont know what to do anymore I have tried everything I dont want to do heroin but I will because I dont know what else to do. I have tried calling rehabs and no one will help me, Im gonna use heroin tonight I dont want to but I dont know what I can do."KW

## 2021-06-04 NOTE — Telephone Encounter (Signed)
Patient called & informed that on Sunday 11/6 he was having a episode  & went to the ER on Monday 11/7. When asked what does " having a episode means to him"? " He stated having a heart attack or stroke". Patient informed that it was ruled out. & that he's back on Heroin. And Received a 3 day supply of Sub Oxane. And was trying to get a refill. Patient asked for the # to the [facility] to seek help.

## 2021-06-04 NOTE — Telephone Encounter (Signed)
Requested medications are due for refill today NO  Requested medications are on the active medication list yes  Last refill 06/01/21  Last visit 05/03/21  Future visit scheduled 06/21/21  Notes to clinic requesting too soon, please assess.  Requested Prescriptions  Pending Prescriptions Disp Refills   omeprazole (PRILOSEC) 40 MG capsule [Pharmacy Med Name: OMEPRAZOLE DR 40 MG CAPSULE] 90 capsule 1    Sig: TAKE 1 CAPSULE (40 MG TOTAL) BY MOUTH DAILY.     There is no refill protocol information for this order     tamsulosin (FLOMAX) 0.4 MG CAPS capsule [Pharmacy Med Name: TAMSULOSIN HCL 0.4 MG CAPSULE] 90 capsule 1    Sig: TAKE 1 CAPSULE BY MOUTH EVERY DAY     There is no refill protocol information for this order
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
## 2021-06-08 ENCOUNTER — Other Ambulatory Visit: Payer: Self-pay | Admitting: Family Medicine

## 2021-06-08 DIAGNOSIS — G2581 Restless legs syndrome: Secondary | ICD-10-CM

## 2021-06-09 ENCOUNTER — Ambulatory Visit (HOSPITAL_COMMUNITY): Payer: Medicaid Other | Admitting: Licensed Clinical Social Worker

## 2021-06-11 ENCOUNTER — Ambulatory Visit (HOSPITAL_COMMUNITY): Payer: Medicaid Other | Admitting: Psychiatry

## 2021-06-20 ENCOUNTER — Other Ambulatory Visit: Payer: Self-pay | Admitting: Family Medicine

## 2021-06-20 DIAGNOSIS — M545 Low back pain, unspecified: Secondary | ICD-10-CM

## 2021-06-20 DIAGNOSIS — G2581 Restless legs syndrome: Secondary | ICD-10-CM

## 2021-06-21 ENCOUNTER — Other Ambulatory Visit: Payer: Self-pay

## 2021-06-21 ENCOUNTER — Ambulatory Visit (INDEPENDENT_AMBULATORY_CARE_PROVIDER_SITE_OTHER): Payer: Medicaid Other | Admitting: Family Medicine

## 2021-06-21 ENCOUNTER — Encounter: Payer: Self-pay | Admitting: Family Medicine

## 2021-06-21 ENCOUNTER — Encounter (HOSPITAL_COMMUNITY): Payer: Medicaid Other | Admitting: Student in an Organized Health Care Education/Training Program

## 2021-06-21 VITALS — BP 86/59 | HR 50

## 2021-06-21 DIAGNOSIS — D649 Anemia, unspecified: Secondary | ICD-10-CM | POA: Diagnosis not present

## 2021-06-21 DIAGNOSIS — F1120 Opioid dependence, uncomplicated: Secondary | ICD-10-CM | POA: Diagnosis not present

## 2021-06-21 DIAGNOSIS — F1193 Opioid use, unspecified with withdrawal: Secondary | ICD-10-CM | POA: Diagnosis not present

## 2021-06-21 DIAGNOSIS — I952 Hypotension due to drugs: Secondary | ICD-10-CM | POA: Diagnosis not present

## 2021-06-21 DIAGNOSIS — E871 Hypo-osmolality and hyponatremia: Secondary | ICD-10-CM

## 2021-06-21 NOTE — Assessment & Plan Note (Signed)
Discussed with [place] during his visit and he spoke to Isla Pence while I was in the room on my phone He did well on 3 days of suboxone, but soon relapsed again He was given options during our visit today

## 2021-06-21 NOTE — Patient Instructions (Addendum)
Stop enalapril for now - it may not be permanent, but you need to raise your blood pressure now.   Substance Abuse Resources   [facility] [address] [place] [phone]   Suboxone Treatment With 95 office-based treatment centers, [facility] is a leading provider of medication-assisted opioid treatment (MAT). We combine FDA-approved medication (such as Suboxone) with counseling and toxicology services.   The Centers for Disease Control and the [place] refer to this as the Gold Standard of care for opioid use disorder because MAT patients have a rate of remission that is 4x higher than those in other forms of treatment.   It is not uncommon for patients wanting to enter treatment to be in a difficult financial situation. That's why we accept Medicaid, Medicare, and major insurance plans accepted   [facility] [phone]   Suboxone Treatment One of the biggest hurdles in opioid addiction is the extremely difficult withdrawal that often keeps people on the drug. Thanks to Suboxone, withdrawal from opioids is easier, giving you the jumpstart you need to kick an opioid use disorder. At [facility], Dr. Brandy Hale offers Suboxone to patients in [place], [place], who are struggling with opioid use disorders, clearing the way for drug-free and happy lives. To learn more about Suboxone, call or use the online scheduling tool to book an appointment.   What does Suboxone treat? Suboxone is designed to help ease the withdrawal process for people with opioid use disorders. Opioids are a class of narcotic drug that's found in:  Heroin Fentanyl, which is available by prescription, but can also be found in illegal drugs Prescribed pain medications, such as oxycodone, hydrocodone, and morphine Opioid use can lead to addiction and dependency, which are extremely hard to overcome. In  these cases, you're not only battling the cravings in your brain, but your body's dependency on the drug. Suboxone is designed to address both of those problems during withdrawal.   [facility]): (offers outpatient therapy and intensive outpatient substance abuse therapy).  [address] [place], [place] [address] Phone: [phone] [facility]) Address: [place], [place], [place] [address] Phone: [phone]   [facility].   Address: [address] [place], [place] [address] Phone#: [phone] :   Referrals to [facility] facilities can be made by [facility]. Referrals are also accepted from physicians, private providers, hospital emergency rooms, family members, or any person who has knowledge of someone in the need of our services.

## 2021-06-21 NOTE — Progress Notes (Signed)
Established patient visit   Patient: Keith Gibbs   DOB: 1961-06-18   60 y.o. Male  MRN: [id number] Visit Date: 06/21/2021  Today's healthcare provider: Lavon Paganini, MD   Chief Complaint  Patient presents with   Medication Refill   Subjective    HPI   Phone note 06/03/21 - "Dr. Chucky May does not take medicaid. Patient advised. I did call [facility] and they did say that patient can walk in on Monday, Wednesday or Friday from 8-2 pm. Patient would have to establish first then attend one group meeting before seeing psychiatrist who would then prescribe Suboxone. They did advise that if patient is needing medication asap that he need to go to the ER. Patient reports he is also trying to schedule an appt with [facility] health center."  -Unable to obtain weight as patient was dizzy after standing up -States its like that all of the time he has been running into walls and things -Does not want to do rehab. Has went to several and doesn't think its for him he needs some type of assistance he cant do it by himself -Still feels numb at times since visiting ED, feels like he is suffocating   Follow up Hospitalization  Patient was admitted to [facility] on 05/31/21 and discharged on 05/31/21. He was treated for Shortness of breath. Treatment for this included Buprenorphine HCl 8-2 MG FILM and predniSONE. Telephone follow up was done on 06/01/21 and 06/03/21 He reports good compliance with treatment. He reports this condition is  "improving but still feels a bit off and just wants to stop using heroin but needs help" .   Started using heroin because he felt like no one was helping him with his pain.  He recognizes that it is out of hand. He feels like his judgement is impaired. He is using crystal meth sometimes. No IV drug use, snorting the heroin.  Tried to quit on his own but had significant withdrawal symptoms. Used last night, none today.  Took suboxone x3 days and then  started getting withdrawal symptoms again on day 2 after suboxone, so went back to heroin.    He is scared that he is going to die. ----------------------------------------------------------------------------------------- -   Medications: Outpatient Medications Prior to Visit  Medication Sig   acetaminophen (TYLENOL) 500 MG tablet Take 2 tablets (1,000 mg total) by mouth every 6 (six) hours.   albuterol (VENTOLIN HFA) 108 (90 Base) MCG/ACT inhaler Inhale 2 puffs into the lungs every 6 (six) hours as needed for wheezing or shortness of breath.   aspirin EC 81 MG tablet Take 81 mg by mouth daily.   atorvastatin (LIPITOR) 80 MG tablet Take 80 mg by mouth daily.   Budeson-Glycopyrrol-Formoterol (BREZTRI AEROSPHERE) 160-9-4.8 MCG/ACT AERO Inhale 2 puffs into the lungs 2 (two) times daily.   cetirizine (ZYRTEC) 10 MG tablet TAKE 1 TABLET BY MOUTH EVERY DAY   clopidogrel (PLAVIX) 75 MG tablet Take 75 mg by mouth daily.   cyclobenzaprine (FLEXERIL) 10 MG tablet Take 1 tablet (10 mg total) by mouth 3 (three) times daily.   divalproex (DEPAKOTE) 250 MG DR tablet Take 1 tablet (250 mg total) by mouth 2 (two) times daily.   docusate sodium (COLACE) 100 MG capsule Take 1 capsule (100 mg total) by mouth 2 (two) times daily.   DULoxetine (CYMBALTA) 60 MG capsule TAKE 1 CAPSULE BY MOUTH EVERY DAY   isosorbide mononitrate (IMDUR) 60 MG 24 hr tablet Take 1 tablet (  60 mg total) by mouth daily.   meloxicam (MOBIC) 15 MG tablet TAKE 1 TABLET (15 MG TOTAL) BY MOUTH DAILY.   Multiple Vitamin (MULTIVITAMIN) tablet Take 1 tablet by mouth daily.   nitroGLYCERIN (NITROSTAT) 0.3 MG SL tablet    nitroGLYCERIN (NITROSTAT) 0.4 MG SL tablet Place 1 tablet (0.4 mg total) under the tongue every 5 (five) minutes x 3 doses as needed for chest pain.   Omega-3 Fatty Acids (FISH OIL) 1200 MG CAPS Take by mouth.   omeprazole (PRILOSEC) 40 MG capsule TAKE 1 CAPSULE (40 MG TOTAL) BY MOUTH DAILY.   QUEtiapine (SEROQUEL) 100 MG  tablet Take 1 tablet (100 mg total) by mouth at bedtime.   rOPINIRole (REQUIP) 2 MG tablet TAKE 1 TABLET BY MOUTH AT BEDTIME.   tamsulosin (FLOMAX) 0.4 MG CAPS capsule TAKE 1 CAPSULE BY MOUTH EVERY DAY   [DISCONTINUED] enalapril (VASOTEC) 2.5 MG tablet Take 2.5 mg by mouth daily.   [DISCONTINUED] predniSONE (DELTASONE) 50 MG tablet Take once daily for the next 5 days   No facility-administered medications prior to visit.    Review of Systems  Constitutional: Negative.   Respiratory: Negative.    Cardiovascular: Negative.   Musculoskeletal:  Positive for back pain and joint swelling.  Skin: Negative.   Neurological:  Positive for dizziness and light-headedness. Negative for seizures.  Psychiatric/Behavioral:  Positive for dysphoric mood and sleep disturbance. Negative for confusion, decreased concentration and suicidal ideas. The patient is nervous/anxious. The patient is not hyperactive.    Last CBC Lab Results  Component Value Date   WBC 9.3 05/31/2021   HGB 12.6 (L) 05/31/2021   HCT 37.2 (L) 05/31/2021   MCV 91.4 05/31/2021   MCH 31.0 05/31/2021   RDW 14.5 05/31/2021   PLT 236 97/08/6376   Last metabolic panel Lab Results  Component Value Date   GLUCOSE 84 05/31/2021   NA 128 (L) 05/31/2021   K 3.3 (L) 05/31/2021   CL 95 (L) 05/31/2021   CO2 28 05/31/2021   BUN 19 05/31/2021   CREATININE 0.86 05/31/2021   GFRNONAA >60 05/31/2021   CALCIUM 8.4 (L) 05/31/2021   PROT 6.9 05/31/2021   ALBUMIN 3.7 05/31/2021   LABGLOB 2.8 04/06/2021   AGRATIO 1.3 04/06/2021   BILITOT 1.0 05/31/2021   ALKPHOS 63 05/31/2021   AST 23 05/31/2021   ALT 16 05/31/2021   ANIONGAP 5 05/31/2021   Last lipids Lab Results  Component Value Date   CHOL 116 12/12/2019   HDL 38 (L) 12/12/2019   LDLCALC 61 12/12/2019   TRIG 87 12/12/2019   CHOLHDL 3.1 12/12/2019   Last hemoglobin A1c Lab Results  Component Value Date   HGBA1C 5.2 12/28/2016   Last thyroid functions Lab Results   Component Value Date   TSH 1.610 09/08/2020   Last vitamin D Lab Results  Component Value Date   25OHVITD2 <1.0 09/11/2017   25OHVITD3 35 09/11/2017   Last vitamin B12 and Folate Lab Results  Component Value Date   VITAMINB12 593 09/08/2020       Objective    BP (!) 86/59   Pulse (!) 50   SpO2 98%  BP Readings from Last 3 Encounters:  06/21/21 (!) 86/59  05/31/21 104/60  05/18/21 111/62   Wt Readings from Last 3 Encounters:  05/31/21 204 lb (92.5 kg)  05/18/21 206 lb (93.4 kg)  05/03/21 208 lb 14.4 oz (94.8 kg)      Physical Exam Vitals reviewed.  Constitutional:  General: He is not in acute distress.    Appearance: Normal appearance. He is not diaphoretic.  HENT:     Head: Normocephalic and atraumatic.  Eyes:     General: No scleral icterus.    Conjunctiva/sclera: Conjunctivae normal.  Cardiovascular:     Rate and Rhythm: Normal rate and regular rhythm.     Pulses: Normal pulses.     Heart sounds: Normal heart sounds. No murmur heard. Pulmonary:     Effort: Pulmonary effort is normal. No respiratory distress.     Breath sounds: Normal breath sounds. No wheezing or rhonchi.  Abdominal:     General: There is no distension.     Palpations: Abdomen is soft.     Tenderness: There is no abdominal tenderness.  Musculoskeletal:     Cervical back: Neck supple.     Right lower leg: No edema.     Left lower leg: No edema.  Lymphadenopathy:     Cervical: No cervical adenopathy.  Skin:    General: Skin is warm and dry.     Capillary Refill: Capillary refill takes less than 2 seconds.     Findings: No rash.  Neurological:     Mental Status: He is alert and oriented to person, place, and time.  Psychiatric:        Mood and Affect: Mood is anxious. Affect is labile.        Speech: Speech normal.        Behavior: Behavior is not agitated. Behavior is cooperative.        Thought Content: Thought content does not include homicidal or suicidal ideation.       No results found for any visits on 06/21/21.  Assessment & Plan     Problem List Items Addressed This Visit       Cardiovascular and Mediastinum   Hypotension due to drugs    Likely 2/2 opioid use currently in the setting of heroin addiction Will stop enalapril Encourage to hydrate well Precautions discussed        Other   Opioid withdrawal (HCC)    Recent episode of withdrawal from opioids Used last nihgt with no current withdrawal symptoms      Relevant Orders   Ambulatory referral to Psychiatry   Heroin addiction [facility]) - Primary    Discussed with [place] during his visit and he spoke to [place] while I was in the room on my phone He did well on 3 days of suboxone, but soon relapsed again He was given options during our visit today      Relevant Orders   Ambulatory referral to Psychiatry   Other Visit Diagnoses     Anemia, unspecified type       Relevant Orders   CBC   Hyponatremia       Relevant Orders   Comprehensive metabolic panel        Return in about 2 months (around 08/21/2021) for chronic disease f/u.      Total time spent on today's visit was greater than 40 minutes, including both face-to-face time and nonface-to-face time personally spent on review of chart (labs and imaging), discussing labs and goals, discussing further work-up, treatment options, referrals to specialist if needed, reviewing outside records of pertinent, answering patient's questions, and coordinating care.    I, Lavon Paganini, MD, have reviewed all documentation for this visit. The documentation on 06/21/21 for the exam, diagnosis, procedures, and orders are all accurate and complete.  Erricka Falkner, Dionne Bucy, MD, MPH [facility]

## 2021-06-21 NOTE — Assessment & Plan Note (Addendum)
Recent episode of withdrawal from opioids Used last nihgt with no current withdrawal symptoms

## 2021-06-21 NOTE — Assessment & Plan Note (Signed)
Likely 2/2 opioid use currently in the setting of heroin addiction Will stop enalapril Encourage to hydrate well Precautions discussed

## 2021-06-22 ENCOUNTER — Telehealth: Payer: Self-pay

## 2021-06-22 DIAGNOSIS — F1120 Opioid dependence, uncomplicated: Secondary | ICD-10-CM

## 2021-06-22 DIAGNOSIS — F1193 Opioid use, unspecified with withdrawal: Secondary | ICD-10-CM

## 2021-06-22 DIAGNOSIS — F1920 Other psychoactive substance dependence, uncomplicated: Secondary | ICD-10-CM

## 2021-06-22 LAB — CBC
Hematocrit: 36 % — ABNORMAL LOW (ref 37.5–51.0)
Hemoglobin: 11.8 g/dL — ABNORMAL LOW (ref 13.0–17.7)
MCH: 29.4 pg (ref 26.6–33.0)
MCHC: 32.8 g/dL (ref 31.5–35.7)
MCV: 90 fL (ref 79–97)
Platelets: 235 10*3/uL (ref 150–450)
RBC: 4.01 x10E6/uL — ABNORMAL LOW (ref 4.14–5.80)
RDW: 14.1 % (ref 11.6–15.4)
WBC: 6.9 10*3/uL (ref 3.4–10.8)

## 2021-06-22 LAB — COMPREHENSIVE METABOLIC PANEL
ALT: 12 IU/L (ref 0–44)
AST: 17 IU/L (ref 0–40)
Albumin/Globulin Ratio: 1.7 (ref 1.2–2.2)
Albumin: 3.8 g/dL (ref 3.8–4.9)
Alkaline Phosphatase: 79 IU/L (ref 44–121)
BUN/Creatinine Ratio: 14 (ref 10–24)
BUN: 10 mg/dL (ref 8–27)
Bilirubin Total: 0.3 mg/dL (ref 0.0–1.2)
CO2: 26 mmol/L (ref 20–29)
Calcium: 9.1 mg/dL (ref 8.6–10.2)
Chloride: 99 mmol/L (ref 96–106)
Creatinine, Ser: 0.69 mg/dL — ABNORMAL LOW (ref 0.76–1.27)
Globulin, Total: 2.2 g/dL (ref 1.5–4.5)
Glucose: 84 mg/dL (ref 70–99)
Potassium: 4.4 mmol/L (ref 3.5–5.2)
Sodium: 137 mmol/L (ref 134–144)
Total Protein: 6 g/dL (ref 6.0–8.5)
eGFR: 106 mL/min/{1.73_m2} (ref >59)

## 2021-06-22 NOTE — Telephone Encounter (Signed)
Copied from [place] [id number]. Topic: Referral - Request for Referral >> Jun 22, 2021  3:05 PM Loma Boston wrote:  [facility] Referral for which specialty:  Preferred provider/office: Request to [facility] Reason for referral: Pt was being treated by  Dr Sherren Kerns / states that he is an addict and need to get him on suboxone. Called office and waited for a nurse at their request but office staff stated to send CRM to [facility] to respond. They want referral faxed to [phone]

## 2021-06-24 NOTE — Telephone Encounter (Signed)
Referral was placed during visit. Can we make sure it is sent to the correct location? Thanks

## 2021-06-25 NOTE — Telephone Encounter (Signed)
Referral for pain management needed for patient to be seen at [place] with Dr. Sherren Kerns. Referral placed and Parke Poisson advised.

## 2021-07-11 ENCOUNTER — Other Ambulatory Visit: Payer: Self-pay | Admitting: Family Medicine

## 2021-07-11 DIAGNOSIS — M545 Low back pain, unspecified: Secondary | ICD-10-CM

## 2021-07-11 DIAGNOSIS — G2581 Restless legs syndrome: Secondary | ICD-10-CM

## 2021-08-12 ENCOUNTER — Ambulatory Visit (HOSPITAL_COMMUNITY): Payer: Medicaid Other | Admitting: Licensed Clinical Social Worker

## 2021-08-16 ENCOUNTER — Encounter (HOSPITAL_COMMUNITY): Payer: Self-pay | Admitting: Student in an Organized Health Care Education/Training Program

## 2021-08-16 ENCOUNTER — Ambulatory Visit (INDEPENDENT_AMBULATORY_CARE_PROVIDER_SITE_OTHER): Payer: Medicaid Other | Admitting: Student in an Organized Health Care Education/Training Program

## 2021-08-16 ENCOUNTER — Other Ambulatory Visit (HOSPITAL_COMMUNITY): Payer: Self-pay | Admitting: Psychiatry

## 2021-08-16 ENCOUNTER — Other Ambulatory Visit: Payer: Self-pay

## 2021-08-16 ENCOUNTER — Other Ambulatory Visit: Payer: Self-pay | Admitting: Family Medicine

## 2021-08-16 VITALS — BP 139/69 | HR 57 | Ht 75.0 in | Wt 205.0 lb

## 2021-08-16 DIAGNOSIS — F250 Schizoaffective disorder, bipolar type: Secondary | ICD-10-CM

## 2021-08-16 DIAGNOSIS — G2581 Restless legs syndrome: Secondary | ICD-10-CM

## 2021-08-16 DIAGNOSIS — M545 Low back pain, unspecified: Secondary | ICD-10-CM

## 2021-08-16 MED ORDER — DIVALPROEX SODIUM 250 MG PO DR TAB: 250.0000 mg | DELAYED_RELEASE_TABLET | Freq: Two times a day (BID) | ORAL | 0 refills | Status: DC

## 2021-08-16 MED ORDER — QUETIAPINE FUMARATE 200 MG PO TABS: 200.0000 mg | ORAL_TABLET | Freq: Every day | ORAL | 3 refills | Status: DC

## 2021-08-16 NOTE — Progress Notes (Signed)
BH MD/PA/NP OP Progress Note  08/16/2021 1:46 PM Keith Gibbs  MRN:  [id number]  Chief Complaint:  Chief Complaint   Medication Management   Since last visit patient was able to reach out to [facility] and sees 2 therapists in their counseling program.  Patient is also [facility] and has started Suboxone treatment.  Patient has been taking his Seroquel 100 mg nightly but has not been taking Depakote 250 mg twice daily.   HPI: Keith Gibbs is a 61 year old Caucasian, male with a reported history of substance use and mood instability.    On assessment today patient reports that he is doing significantly better since last seen provider.  Patient reports he has been going to counseling at [facility] facilities and has also become part of a [facility] clinic [facility].  Patient reports that he has been sober from heroin for 1.5 months.  Patient reports that he is living well with his mother.  Patient reports that he has bought furniture for both he and his mother and has been paying it off.  Patient reports that his mood is still a bit unstable and he feels that he can "fly off the handle really easily."  Patient reports he is also not sleeping very well at night and still feels a bit paranoid.  Patient reports that he feels like people may be out to get him and may be tapping his phone.  Patient reports that he specifically believes the government and the law may be listening on his phone conversations "because of my dealings with drugs."  Patient also endorses having some illusions but denies true visual hallucinations.  Patient also denies current SI recent SI, and does not endorse HI nor AH.  Patient reports that he is interested in restarting Depakote as he feels that it is previous poor response was 2/2 his concurrent heroin use.  Visit Diagnosis:    ICD-10-CM   1. Schizoaffective disorder, bipolar type ([place])  F25.0 QUEtiapine (SEROQUEL) 200 MG tablet    divalproex (DEPAKOTE)  250 MG DR tablet      Past Psychiatric History: Patient endorses being hospitalized 3 times.  Patient reports the first time he was hospitalized in his 66s and the last time was a 1-2 years ago at [facility].  Reports that he was at [facility] he stayed approximately 30 days.  Patient reports that all 3 hospitalizations were due to suicide attempts.  Patient reports he has tried to hang himself (the belt broke), slit his wrist, OD.   Acute: At previous visit 04/05/2021 patient was started on gabapentin to help with cravings and mood instability.  Patient was reporting a history of elevated LFTs; therefore stronger mood stabilizers are not indicated at the time.  Patient reports on assessment today 04/26/2021 that he has been on Seroquel and Depakote in the past and responded well.   04/26/2021: Patient started on Seroquel and Depakote.  05/2021-patient missed appointment however his primary care provider called when patient arrived for his PCP appointment and patient was connected with Ava Johnnye Sima for resources for heroin use disorder.  Past Medical History:  Past Medical History:  Diagnosis Date   Anginal pain ([place])    Anxiety    Arthritis    Bipolar 1 disorder (HCC)    Bursitis    CAD (coronary artery disease)    Chronic pain    COPD (chronic obstructive pulmonary disease) (HCC)    Current use of long term anticoagulation  DAPT (ASA + clopidogrel)   Depression    Diverticulitis    Dyspnea    GERD (gastroesophageal reflux disease)    Grade I diastolic dysfunction    Hepatitis C 2012   No longer has Hep C   HLD (hyperlipidemia)    Hypertension    MI (myocardial infarction) (HCC)    Polysubstance abuse (HCC)    cocaine, marijuana, ETOH   PUD (peptic ulcer disease)    S/P angioplasty with stent 06/10/2016   a.) 90% stenosis of pLAD to mLAD - 2.5 x 18 mm Xience Alpine (DES x 1) placed to pLAD   S/P PTCA (percutaneous transluminal coronary angioplasty) 12/04/2019   a.) 60% in stent  restenosis of DES to pLAD; LVEF 65%.   Schizophrenia ([place])    Stroke [facility])    Valvular insufficiency    a.) Mild MR, TR, PR; mild to moderate AR on 03/05/2018 TTE    Past Surgical History:  Procedure Laterality Date   ABDOMINAL SURGERY     removed small piece of intestines due to [facility] Diverticulosis   APPENDECTOMY     BACK SURGERY     CARDIAC CATHETERIZATION Left 06/10/2016   Procedure: Left Heart Cath and Coronary Angiography;  Surgeon: Dionisio David, MD;  Location: [place] [facility];  Service: Cardiovascular;  Laterality: Left;   CARDIAC CATHETERIZATION N/A 06/10/2016   Procedure: Coronary Stent Intervention;  Surgeon: Yolonda Kida, MD;  Location: [place] [facility];  Service: Cardiovascular;  Laterality: N/A;   COLON SURGERY     COLONOSCOPY     COLONOSCOPY WITH PROPOFOL N/A 01/05/2017   Procedure: COLONOSCOPY WITH PROPOFOL;  Surgeon: Jonathon Bellows, MD;  Location: [facility] ENDOSCOPY;  Service: Endoscopy;  Laterality: N/A;   COLONOSCOPY WITH PROPOFOL N/A 02/13/2020   Procedure: COLONOSCOPY WITH PROPOFOL;  Surgeon: Virgel Manifold, MD;  Location: [facility] ENDOSCOPY;  Service: Endoscopy;  Laterality: N/A;   CORONARY ANGIOPLASTY WITH STENT PLACEMENT     ESOPHAGOGASTRODUODENOSCOPY (EGD) WITH PROPOFOL N/A 01/05/2017   Procedure: ESOPHAGOGASTRODUODENOSCOPY (EGD) WITH PROPOFOL;  Surgeon: Jonathon Bellows, MD;  Location: [facility] ENDOSCOPY;  Service: Endoscopy;  Laterality: N/A;   ESOPHAGOGASTRODUODENOSCOPY (EGD) WITH PROPOFOL N/A 02/13/2020   Procedure: ESOPHAGOGASTRODUODENOSCOPY (EGD) WITH PROPOFOL;  Surgeon: Virgel Manifold, MD;  Location: [facility] ENDOSCOPY;  Service: Endoscopy;  Laterality: N/A;   INTRAVASCULAR PRESSURE WIRE/FFR STUDY N/A 12/04/2019   Procedure: INTRAVASCULAR PRESSURE WIRE/FFR STUDY;  Surgeon: Sherren Mocha, MD;  Location: [place] [facility];  Service: Cardiovascular;  Laterality: N/A;   KNEE ARTHROSCOPY WITH MEDIAL MENISECTOMY Right 09/05/2017   Procedure: KNEE  ARTHROSCOPY WITH MEDIAL AND LATERAL  MENISECTOMY PARTIAL SYNOVECTOMY;  Surgeon: Hessie Knows, MD;  Location: [facility] ORS;  Service: Orthopedics;  Laterality: Right;   LEFT HEART CATH AND CORONARY ANGIOGRAPHY N/A 12/04/2019   Procedure: LEFT HEART CATH AND CORONARY ANGIOGRAPHY;  Surgeon: Sherren Mocha, MD;  Location: [place] [facility];  Service: Cardiovascular;  Laterality: N/A;   SHOULDER SURGERY Right 04/09/2012   SPINE SURGERY     TOTAL KNEE ARTHROPLASTY Right 01/19/2021   Procedure: TOTAL KNEE ARTHROPLASTY - Rachelle Hora to Assist;  Surgeon: Hessie Knows, MD;  Location: [facility] ORS;  Service: Orthopedics;  Laterality: Right;    Family Psychiatric History:  Sister: Depression, paternal grandma-depression and anxiety, multiple maternal family members: EtOH use disorder, paternal aunt: schizophrenia was hospitalized, another paternal aunt: depression and anxiety    Family History:  Family History  Problem Relation Age of Onset   Osteoarthritis Mother    Heart  disease Mother    Hypertension Mother    Depression Mother    Heart disease Father    Early death Father    Hypertension Father    Heart attack Father    Hypertension Sister    Prostate cancer Neg Hx    Bladder Cancer Neg Hx    Kidney cancer Neg Hx     Social History:  Social History   Socioeconomic History   Marital status: Widowed    Spouse name: Not on file   Number of children: Not on file   Years of education: Not on file   Highest education level: Not on file  Occupational History   Occupation: disability   Occupation: stocking at gas station    Comment: 2 days per week  Tobacco Use   Smoking status: Every Day    Packs/day: 0.50    Years: 32.00    Pack years: 16.00    Types: Cigarettes   Smokeless tobacco: Never   Tobacco comments:    Continues to smoke approximately half a pack per day.  Vaping Use   Vaping Use: Never used  Substance and Sexual Activity   Alcohol use: Yes    Alcohol/week: 6.0 standard  drinks    Types: 6 Cans of beer per week    Comment: occassionally    Drug use: Yes    Types: Cocaine, Marijuana    Comment: last on saturday   Sexual activity: Yes    Partners: Female    Birth control/protection: None  Other Topics Concern   Not on file  Social History Narrative   Not on file   Social Determinants of Health   Financial Resource Strain: Not on file  Food Insecurity: Not on file  Transportation Needs: Not on file  Physical Activity: Not on file  Stress: Not on file  Social Connections: Not on file    Allergies:  Allergies  Allergen Reactions   Asenapine Maleate    Latuda [Lurasidone Hcl] Other (See Comments)    tremor    Lurasidone    Naproxen Hives and Swelling    Pt denies.   Saphris [Asenapine] Other (See Comments)    Increased tremors     Metabolic Disorder Labs: Lab Results  Component Value Date   HGBA1C 5.2 12/28/2016   MPG 103 12/28/2016   No results found for: PROLACTIN Lab Results  Component Value Date   CHOL 116 12/12/2019   TRIG 87 12/12/2019   HDL 38 (L) 12/12/2019   CHOLHDL 3.1 12/12/2019   VLDL 12 12/28/2016   LDLCALC 61 12/12/2019   LDLCALC 39 04/02/2018   Lab Results  Component Value Date   TSH 1.610 09/08/2020   TSH 1.130 12/12/2019    Therapeutic Level Labs: No results found for: LITHIUM No results found for: VALPROATE No components found for:  CBMZ  Current Medications: Current Outpatient Medications  Medication Sig Dispense Refill   acetaminophen (TYLENOL) 500 MG tablet Take 2 tablets (1,000 mg total) by mouth every 6 (six) hours. 30 tablet 0   albuterol (VENTOLIN HFA) 108 (90 Base) MCG/ACT inhaler Inhale 2 puffs into the lungs every 6 (six) hours as needed for wheezing or shortness of breath. 18 g 6   aspirin EC 81 MG tablet Take 81 mg by mouth daily.     atorvastatin (LIPITOR) 80 MG tablet Take 80 mg by mouth daily.     Budeson-Glycopyrrol-Formoterol (BREZTRI AEROSPHERE) 160-9-4.8 MCG/ACT AERO Inhale 2  puffs into the lungs 2 (two) times  daily. 10.7 g 11   cetirizine (ZYRTEC) 10 MG tablet TAKE 1 TABLET BY MOUTH EVERY DAY 90 tablet 3   clopidogrel (PLAVIX) 75 MG tablet Take 75 mg by mouth daily.     cyclobenzaprine (FLEXERIL) 10 MG tablet TAKE 1 TABLET BY MOUTH THREE TIMES A DAY 60 tablet 1   divalproex (DEPAKOTE) 250 MG DR tablet Take 1 tablet (250 mg total) by mouth 2 (two) times daily. 60 tablet 0   docusate sodium (COLACE) 100 MG capsule Take 1 capsule (100 mg total) by mouth 2 (two) times daily. 10 capsule 0   DULoxetine (CYMBALTA) 60 MG capsule TAKE 1 CAPSULE BY MOUTH EVERY DAY 180 capsule 1   isosorbide mononitrate (IMDUR) 60 MG 24 hr tablet Take 1 tablet (60 mg total) by mouth daily. 30 tablet 5   meloxicam (MOBIC) 15 MG tablet TAKE 1 TABLET (15 MG TOTAL) BY MOUTH DAILY. 30 tablet 0   Multiple Vitamin (MULTIVITAMIN) tablet Take 1 tablet by mouth daily.     nitroGLYCERIN (NITROSTAT) 0.3 MG SL tablet      nitroGLYCERIN (NITROSTAT) 0.4 MG SL tablet Place 1 tablet (0.4 mg total) under the tongue every 5 (five) minutes x 3 doses as needed for chest pain. 30 tablet 3   Omega-3 Fatty Acids (FISH OIL) 1200 MG CAPS Take by mouth.     omeprazole (PRILOSEC) 40 MG capsule TAKE 1 CAPSULE (40 MG TOTAL) BY MOUTH DAILY. 90 capsule 1   QUEtiapine (SEROQUEL) 200 MG tablet Take 1 tablet (200 mg total) by mouth at bedtime. 30 tablet 3   rOPINIRole (REQUIP) 2 MG tablet TAKE 1 TABLET BY MOUTH EVERYDAY AT BEDTIME 30 tablet 0   tamsulosin (FLOMAX) 0.4 MG CAPS capsule TAKE 1 CAPSULE BY MOUTH EVERY DAY 90 capsule 1   No current facility-administered medications for this visit.     Musculoskeletal: Strength & Muscle Tone: within normal limits Gait & Station: normal Patient leans: N/A  Psychiatric Specialty Exam: Review of Systems  Cardiovascular:  Negative for chest pain.  Psychiatric/Behavioral:  Negative for hallucinations and self-injury.    Blood pressure 139/69, pulse (!) 57, height 6\' 3"  (1.905 m),  weight 205 lb (93 kg).Body mass index is 25.62 kg/m.  General Appearance: Casual and Fairly Groomed  Eye Contact:  Good  Speech:  Clear and Coherent  Volume:  Normal  Mood:  Anxious and Euphoric "I am anxious because I am worried that something is going to trip up my good plans."  Affect:  Appropriate and Congruent  Thought Process:  Coherent  Orientation:  Full (Time, Place, and Person)  Thought Content: Logical   Suicidal Thoughts:  No  Homicidal Thoughts:  No  Memory:  Immediate;   Good Recent;   Good  Judgement:  Fair  Insight:  Fair  Psychomotor Activity:  Tremor and patient has no rigidity in muscles on exam  Concentration:  Concentration: Good  Recall:  NA  Fund of Knowledge: Good  Language: Good  Akathisia:  NA    AIMS (if indicated): Not done  Assets:  Communication Skills Desire for Improvement Housing Resilience Social Support  ADL's:  Intact  Cognition: WNL  Sleep:   Poor patient endorses having multiple nightmares since some of which are based in reality and some are fantasy.   Screenings: AIMS    Flowsheet Row Admission (Discharged) from 12/27/2016 in [place] Total Score 1      AUDIT    Flowsheet Row Admission (Discharged) from  12/27/2016 in [place]  Alcohol Use Disorder Identification Test Final Score (AUDIT) 32      GAD-7    Flowsheet [place] from 06/03/2019 in [place]  Total GAD-7 Score 14      PHQ2-9    [place] Office Visit from 06/21/2021 in [place] from 03/12/2021 in [facility] Office Visit from 01/29/2021 in [place] Visit from 12/12/2019 in [place] Visit from 07/08/2019 in [place]  PHQ-2 Total Score 2 4 4 4  0  PHQ-9 Total Score 7 16 11 14  --      Flowsheet Row ED from 05/31/2021 in [place] [facility] from 03/12/2021 in [facility] Admission (Discharged) from 01/19/2021 in [place] (1A)  C-SSRS RISK CATEGORY No Risk No Risk No Risk        Assessment and Plan:  Keith Gibbs is a 61 year old Caucasian, male who continues to endorse polysubstance abuse, mood instability, and feelings of mild paranoia.  Patient has had significant improvement since his last visit.  Patient endorses 1.5 months of sobriety.  Patient dorsa is feeling that he has a good support system endorses feeling that he has people to talk to to help him learn coping skills.  Patient endorses feeling ready to restart Depakote to help with his mood regulation.  Patient recalled history of being on at least 800 mg of Depakote in the past.  Patient and provider discussed patient sleep hygiene.  Patient did endorse that he was eating numerous sugary foods and watching horror movies and playing horror or action games prior to bed.  Patient provider discussed decreasing these activities and providing a more appropriate environment such as reading and decreasing blue light before bed.  Patient endorsed understanding.  There was some discussion that some of patient's nightmares are based in his previous experiences, concerning for PTSD related nightmares.  However, patient was recently diagnosed with hypotension and his PCP discontinued his antihypertensive due to concern for low blood pressure.  We will continue to monitor patient and reassess at next visit.  Patient may benefit from prazosin or propranolol; however would like to attempt treatment with sleep hygiene first.  Patient will also benefit from increase in Seroquel to help with sleep as well as his low-level paranoia.  Patient labs in 05/2021 with his PCP indicate normal LFTs.  Schizoaffective disorder, bipolar type (R/O bipolar disorder with psychosis,  substance-induced psychosis, substance-induced mood disorder) - Start Depakote 250 mg twice daily - Increase Seroquel to 200 mg nightly   Polysubstance abuse - Recommend patient continue using resources at [facility] - Recommend patient continue Suboxone clinic at [facility] pain clinic.  PGY-2 Freida Busman, MD 08/16/2021, 1:46 PM

## 2021-08-17 NOTE — Telephone Encounter (Signed)
Requested Prescriptions  Pending Prescriptions Disp Refills   rOPINIRole (REQUIP) 2 MG tablet [Pharmacy Med Name: ROPINIROLE HCL 2 MG TABLET] 30 tablet 0    Sig: TAKE 1 TABLET BY MOUTH EVERYDAY AT BEDTIME     Neurology:  Parkinsonian Agents Passed - 08/16/2021  6:57 PM      Passed - Last BP in normal range    BP Readings from Last 1 Encounters:  06/21/21 (!) 86/59         Passed - Valid encounter within last 12 months    Recent Outpatient Visits          1 month ago Heroin addiction [facility])   [facility] [place], Dionne Bucy, MD   3 months ago Acquired deviated nasal septum   [facility] Gwyneth Sprout, FNP   6 months ago Right testicular pain   [facility] [place], Dionne Bucy, MD   11 months ago Tremors of nervous system   [facility] [place], Kelby Aline, FNP   1 year ago Chronic hepatitis C without hepatic coma [facility])   [place], Kelby Aline, FNP      Future Appointments            In 2 weeks Gwyneth Sprout, [place], [place]   In 2 months Ralene Bathe, MD [place]            meloxicam ([place]) 15 MG tablet [Pharmacy Med Name: MELOXICAM 15 MG TABLET] 30 tablet 0    Sig: TAKE 1 TABLET (15 MG TOTAL) BY MOUTH DAILY.     Analgesics:  COX2 Inhibitors Failed - 08/16/2021  6:57 PM      Failed - HGB in normal range and within 360 days    Hemoglobin  Date Value Ref Range Status  06/21/2021 11.8 (L) 13.0 - 17.7 g/dL Final         Failed - Cr in normal range and within 360 days    Creatinine, Ser  Date Value Ref Range Status  06/21/2021 0.69 (L) 0.76 - 1.27 mg/dL Final         Passed - Patient is not pregnant      Passed - Valid encounter within last 12 months    Recent Outpatient Visits          1 month ago Heroin addiction [facility])   [facility] [place], Dionne Bucy, MD   3 months ago Acquired deviated nasal septum   [facility] Gwyneth Sprout, FNP   6 months ago Right testicular pain   [facility] [place], Dionne Bucy, MD   11 months ago Tremors of nervous system   [facility] [place], Kelby Aline, [place]   1 year ago Chronic hepatitis C without hepatic coma [facility])   [place], Kelby Aline, FNP      Future Appointments            In 2 weeks Gwyneth Sprout, [place], [place]   In 2 months Ralene Bathe, MD [place]

## 2021-08-18 ENCOUNTER — Other Ambulatory Visit: Payer: Self-pay | Admitting: Family Medicine

## 2021-08-18 ENCOUNTER — Ambulatory Visit (HOSPITAL_COMMUNITY): Payer: Medicaid Other | Admitting: Licensed Clinical Social Worker

## 2021-08-18 ENCOUNTER — Encounter (HOSPITAL_COMMUNITY): Payer: Self-pay

## 2021-08-18 DIAGNOSIS — M545 Low back pain, unspecified: Secondary | ICD-10-CM

## 2021-08-24 ENCOUNTER — Ambulatory Visit (HOSPITAL_COMMUNITY): Payer: Medicaid Other | Admitting: Licensed Clinical Social Worker

## 2021-08-30 ENCOUNTER — Ambulatory Visit: Payer: Medicaid Other | Admitting: Family Medicine
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
## 2021-08-31 ENCOUNTER — Ambulatory Visit (INDEPENDENT_AMBULATORY_CARE_PROVIDER_SITE_OTHER): Payer: Medicaid Other | Admitting: Family Medicine

## 2021-08-31 ENCOUNTER — Encounter: Payer: Self-pay | Admitting: Family Medicine

## 2021-08-31 ENCOUNTER — Other Ambulatory Visit: Payer: Self-pay

## 2021-08-31 VITALS — BP 116/66 | HR 53 | Temp 98.3°F | Wt 216.0 lb

## 2021-08-31 DIAGNOSIS — M10172 Lead-induced gout, left ankle and foot: Secondary | ICD-10-CM

## 2021-08-31 DIAGNOSIS — Z136 Encounter for screening for cardiovascular disorders: Secondary | ICD-10-CM

## 2021-08-31 DIAGNOSIS — F1920 Other psychoactive substance dependence, uncomplicated: Secondary | ICD-10-CM

## 2021-08-31 DIAGNOSIS — F1120 Opioid dependence, uncomplicated: Secondary | ICD-10-CM | POA: Diagnosis not present

## 2021-08-31 DIAGNOSIS — Z23 Encounter for immunization: Secondary | ICD-10-CM | POA: Diagnosis not present

## 2021-08-31 DIAGNOSIS — F17210 Nicotine dependence, cigarettes, uncomplicated: Secondary | ICD-10-CM | POA: Diagnosis not present

## 2021-08-31 DIAGNOSIS — D508 Other iron deficiency anemias: Secondary | ICD-10-CM | POA: Insufficient documentation

## 2021-08-31 DIAGNOSIS — F250 Schizoaffective disorder, bipolar type: Secondary | ICD-10-CM | POA: Diagnosis not present

## 2021-08-31 DIAGNOSIS — Z13220 Encounter for screening for lipoid disorders: Secondary | ICD-10-CM

## 2021-08-31 DIAGNOSIS — G8929 Other chronic pain: Secondary | ICD-10-CM

## 2021-08-31 DIAGNOSIS — T560X1A Toxic effect of lead and its compounds, accidental (unintentional), initial encounter: Secondary | ICD-10-CM | POA: Insufficient documentation

## 2021-08-31 DIAGNOSIS — M25511 Pain in right shoulder: Secondary | ICD-10-CM
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
## 2021-08-31 NOTE — Assessment & Plan Note (Signed)
Chronic, improving Doing well on depakote and seroquel Continues with counseling Very grateful to have gotten the resources he needs Contracted to safety; denies SI or HI

## 2021-08-31 NOTE — Assessment & Plan Note (Signed)
Chronic, improving Use of 2 films, suboxone daily Denies craving Has been clean from heroin for >2 months per ppd

## 2021-08-31 NOTE — Assessment & Plan Note (Signed)
Due for 2nd vaccine today

## 2021-08-31 NOTE — Assessment & Plan Note (Signed)
Exercises provided; on mobic and flexeril Encouraged use of topical voltaren Has upcoming appt with ortho

## 2021-08-31 NOTE — Assessment & Plan Note (Signed)
Large inflamed L great toe Non reddened Reports chronic in nature Has upcoming appt with podiatry Check uric acid levels today Info provided on gout; if uric acid levels high, will start gout meds Denies use of IVDU

## 2021-08-31 NOTE — Assessment & Plan Note (Signed)
Check lipids today; non-fasting 

## 2021-08-31 NOTE — Assessment & Plan Note (Signed)
Repeat CBC Denies fatigue

## 2021-08-31 NOTE — Assessment & Plan Note (Signed)
Has not used tobacco, 2/7 Increased in oral candy Referral placed for smoking cessation

## 2021-09-01 LAB — CBC WITH DIFFERENTIAL/PLATELET
Basophils Absolute: 0 10*3/uL (ref 0.0–0.2)
Basos: 0 %
EOS (ABSOLUTE): 0.4 10*3/uL (ref 0.0–0.4)
Eos: 5 %
Hematocrit: 39.6 % (ref 37.5–51.0)
Hemoglobin: 13.6 g/dL (ref 13.0–17.7)
Immature Grans (Abs): 0 10*3/uL (ref 0.0–0.1)
Immature Granulocytes: 0 %
Lymphocytes Absolute: 2.4 10*3/uL (ref 0.7–3.1)
Lymphs: 25 %
MCH: 31 pg (ref 26.6–33.0)
MCHC: 34.3 g/dL (ref 31.5–35.7)
MCV: 90 fL (ref 79–97)
Monocytes Absolute: 0.6 10*3/uL (ref 0.1–0.9)
Monocytes: 6 %
Neutrophils Absolute: 6 10*3/uL (ref 1.4–7.0)
Neutrophils: 64 %
Platelets: 200 10*3/uL (ref 150–450)
RBC: 4.39 x10E6/uL (ref 4.14–5.80)
RDW: 13.7 % (ref 11.6–15.4)
WBC: 9.6 10*3/uL (ref 3.4–10.8)

## 2021-09-01 LAB — URIC ACID: Uric Acid: 3.8 mg/dL (ref 3.8–8.4)

## 2021-09-01 LAB — LIPID PANEL
Chol/HDL Ratio: 2.8 ratio (ref 0.0–5.0)
Cholesterol, Total: 92 mg/dL — ABNORMAL LOW (ref 100–199)
HDL: 33 mg/dL — ABNORMAL LOW (ref >39)
LDL Chol Calc (NIH): 41 mg/dL (ref 0–99)
Triglycerides: 91 mg/dL (ref 0–149)
VLDL Cholesterol Cal: 18 mg/dL (ref 5–40)

## 2021-09-06 ENCOUNTER — Other Ambulatory Visit: Payer: Self-pay | Admitting: Family Medicine

## 2021-09-06 DIAGNOSIS — M545 Low back pain, unspecified: Secondary | ICD-10-CM

## 2021-09-06 DIAGNOSIS — G2581 Restless legs syndrome: Secondary | ICD-10-CM

## 2021-09-07 NOTE — Telephone Encounter (Signed)
Requested Prescriptions  Pending Prescriptions Disp Refills   meloxicam (MOBIC) 15 MG tablet [Pharmacy Med Name: MELOXICAM 15 MG TABLET] 30 tablet 0    Sig: TAKE 1 TABLET (15 MG TOTAL) BY MOUTH DAILY.     Analgesics:  COX2 Inhibitors Failed - 09/06/2021  5:32 PM      Failed - Manual Review: Labs are only required if the patient has taken medication for more than 8 weeks.      Failed - Cr in normal range and within 360 days    Creatinine, Ser  Date Value Ref Range Status  06/21/2021 0.69 (L) 0.76 - 1.27 mg/dL Final         Passed - HGB in normal range and within 360 days    Hemoglobin  Date Value Ref Range Status  08/31/2021 13.6 13.0 - 17.7 g/dL Final         Passed - HCT in normal range and within 360 days    Hematocrit  Date Value Ref Range Status  08/31/2021 39.6 37.5 - 51.0 % Final         Passed - AST in normal range and within 360 days    AST  Date Value Ref Range Status  06/21/2021 17 0 - 40 IU/L Final         Passed - ALT in normal range and within 360 days    ALT  Date Value Ref Range Status  06/21/2021 12 0 - 44 IU/L Final         Passed - eGFR is 30 or above and within 360 days    GFR calc Af Amer  Date Value Ref Range Status  09/08/2020 90 >59 mL/min/1.73 Final    Comment:    **In accordance with recommendations from the NKF-ASN Task force,**   [facility] is in the process of updating its eGFR calculation to the   2021 CKD-EPI creatinine equation that estimates kidney function   without a race variable.    GFR, Estimated  Date Value Ref Range Status  05/31/2021 >60 >60 mL/min Final    Comment:    (NOTE) Calculated using the CKD-EPI Creatinine Equation (2021)    eGFR  Date Value Ref Range Status  06/21/2021 106 >59 mL/min/1.73 Final         Passed - Patient is not pregnant      Passed - Valid encounter within last 12 months    Recent Outpatient Visits          1 week ago Schizoaffective disorder, bipolar type [facility])   [facility] Tally Joe T, FNP   2 months ago Heroin addiction [facility])   [facility] [place], Dionne Bucy, MD   4 months ago Acquired deviated nasal septum   [facility] Gwyneth Sprout, FNP   7 months ago Right testicular pain   [facility] [place], Dionne Bucy, MD   12 months ago Tremors of nervous system   [place], FNP      Future Appointments            In 1 month Ralene Bathe, MD [place]   In 2 months Gildardo Pounds, NP [place]            rOPINIRole (REQUIP) 2 MG tablet [Pharmacy Med Name: ROPINIROLE HCL 2 MG TABLET] 30 tablet 0    Sig: TAKE 1 TABLET BY MOUTH EVERYDAY AT BEDTIME  Neurology:  Parkinsonian Agents Passed - 09/06/2021  5:32 PM  °  °  Passed - Last BP in normal range  °  BP Readings from Last 1 Encounters:  °08/31/21 116/66  °   °  °  Passed - Last Heart Rate in normal range  °  Pulse Readings from Last 1 Encounters:  °08/31/21 (!) 53  °   °  °  Passed - Valid encounter within last 12 months  °  Recent Outpatient Visits   °      ° 1 week ago Schizoaffective disorder, bipolar type (HCC)  ° [facility] Payne, Elise T, FNP  ° 2 months ago 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HCC)  ° [facility] Bacigalupo, Angela M, MD  ° 4 months ago Acquired deviated nasal septum  ° [facility] Payne, Elise T, FNP  ° 7 months ago Right testicular pain  ° [facility] Bacigalupo, Angela M, MD  ° 12 months ago Tremors of nervous system  ° [facility] Flinchum, Michelle S, FNP  °  °  °Future Appointments   °        ° In 1 month Kowalski, David C, MD [facility]  ° In 2 months Fleming, Zelda W, NP [facility]  °  ° °  °  °  ° °

## 2021-09-14 ENCOUNTER — Emergency Department (HOSPITAL_COMMUNITY): Payer: Medicaid Other

## 2021-09-14 ENCOUNTER — Other Ambulatory Visit: Payer: Self-pay

## 2021-09-14 ENCOUNTER — Encounter (HOSPITAL_COMMUNITY): Payer: Self-pay

## 2021-09-14 ENCOUNTER — Emergency Department (HOSPITAL_COMMUNITY)
Admission: EM | Admit: 2021-09-14 | Discharge: 2021-09-15 | Disposition: A | Discharge status: Home / Self Care | Payer: Medicaid Other | Attending: Student | Admitting: Student

## 2021-09-14 DIAGNOSIS — M542 Cervicalgia: Secondary | ICD-10-CM | POA: Insufficient documentation

## 2021-09-14 DIAGNOSIS — J449 Chronic obstructive pulmonary disease, unspecified: Secondary | ICD-10-CM | POA: Diagnosis not present

## 2021-09-14 DIAGNOSIS — F17210 Nicotine dependence, cigarettes, uncomplicated: Secondary | ICD-10-CM | POA: Diagnosis not present

## 2021-09-14 DIAGNOSIS — Z7951 Long term (current) use of inhaled steroids: Secondary | ICD-10-CM | POA: Diagnosis not present

## 2021-09-14 DIAGNOSIS — Z96651 Presence of right artificial knee joint: Secondary | ICD-10-CM | POA: Diagnosis not present

## 2021-09-14 DIAGNOSIS — Y92410 Unspecified street and highway as the place of occurrence of the external cause: Secondary | ICD-10-CM | POA: Diagnosis not present

## 2021-09-14 DIAGNOSIS — I10 Essential (primary) hypertension: Secondary | ICD-10-CM | POA: Insufficient documentation

## 2021-09-14 DIAGNOSIS — S0990XA Unspecified injury of head, initial encounter: Secondary | ICD-10-CM | POA: Diagnosis present

## 2021-09-14 DIAGNOSIS — M79631 Pain in right forearm: Secondary | ICD-10-CM | POA: Insufficient documentation

## 2021-09-14 DIAGNOSIS — M545 Low back pain, unspecified: Secondary | ICD-10-CM | POA: Insufficient documentation

## 2021-09-14 DIAGNOSIS — Z7982 Long term (current) use of aspirin: Secondary | ICD-10-CM | POA: Insufficient documentation

## 2021-09-14 DIAGNOSIS — Z955 Presence of coronary angioplasty implant and graft: Secondary | ICD-10-CM | POA: Diagnosis not present

## 2021-09-14 DIAGNOSIS — I25119 Atherosclerotic heart disease of native coronary artery with unspecified angina pectoris: Secondary | ICD-10-CM | POA: Diagnosis not present

## 2021-09-14 DIAGNOSIS — M25531 Pain in right wrist: Secondary | ICD-10-CM | POA: Diagnosis not present

## 2021-09-14 DIAGNOSIS — M25522 Pain in left elbow: Secondary | ICD-10-CM | POA: Diagnosis not present

## 2021-09-14 MED ORDER — KETOROLAC TROMETHAMINE 15 MG/ML IJ SOLN
15.0000 mg | Freq: Once | INTRAMUSCULAR | Status: AC
  Administered 2021-09-14: 15 mg via INTRAMUSCULAR
  Filled 2021-09-14: qty 1

## 2021-09-14 MED ORDER — GABAPENTIN 300 MG PO CAPS: 300.0000 mg | ORAL_CAPSULE | Freq: Two times a day (BID) | ORAL | 0 refills | Status: DC

## 2021-09-14 MED ORDER — LIDOCAINE 5 % EX PTCH
1.0000 | MEDICATED_PATCH | CUTANEOUS | Status: DC
  Administered 2021-09-14: 1 via TRANSDERMAL
  Filled 2021-09-14: qty 1

## 2021-09-14 MED ORDER — NAPROXEN 375 MG PO TABS: 375.0000 mg | ORAL_TABLET | Freq: Two times a day (BID) | ORAL | 0 refills | Status: DC

## 2021-09-14 MED ORDER — ACETAMINOPHEN 500 MG PO TABS
1000.0000 mg | ORAL_TABLET | Freq: Once | ORAL | Status: AC
  Administered 2021-09-14: 1000 mg via ORAL
  Filled 2021-09-14: qty 2

## 2021-09-14 MED ORDER — GABAPENTIN 300 MG PO CAPS
300.0000 mg | ORAL_CAPSULE | Freq: Once | ORAL | Status: AC
  Administered 2021-09-14: 300 mg via ORAL
  Filled 2021-09-14: qty 1
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
## 2021-09-14 NOTE — ED Notes (Signed)
Patch removed. Pt. Transported to MRI.
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
## 2021-09-15 ENCOUNTER — Encounter: Payer: Self-pay | Admitting: Physician Assistant

## 2021-09-15 ENCOUNTER — Ambulatory Visit (INDEPENDENT_AMBULATORY_CARE_PROVIDER_SITE_OTHER): Payer: Medicaid Other | Admitting: Physician Assistant

## 2021-09-15 ENCOUNTER — Ambulatory Visit: Payer: Self-pay | Admitting: *Deleted

## 2021-09-15 DIAGNOSIS — M7150 Other bursitis, not elsewhere classified, unspecified site: Secondary | ICD-10-CM

## 2021-09-15 DIAGNOSIS — M5412 Radiculopathy, cervical region: Secondary | ICD-10-CM
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
## 2021-09-19 ENCOUNTER — Other Ambulatory Visit: Payer: Self-pay | Admitting: Family Medicine

## 2021-09-19 ENCOUNTER — Other Ambulatory Visit (HOSPITAL_COMMUNITY): Payer: Self-pay | Admitting: Psychiatry

## 2021-09-19 DIAGNOSIS — F250 Schizoaffective disorder, bipolar type: Secondary | ICD-10-CM

## 2021-09-19 DIAGNOSIS — M545 Low back pain, unspecified: Secondary | ICD-10-CM
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
## 2021-10-04 ENCOUNTER — Encounter (HOSPITAL_COMMUNITY): Payer: Self-pay | Admitting: Student in an Organized Health Care Education/Training Program

## 2021-10-04 ENCOUNTER — Other Ambulatory Visit: Payer: Self-pay

## 2021-10-04 ENCOUNTER — Ambulatory Visit (INDEPENDENT_AMBULATORY_CARE_PROVIDER_SITE_OTHER): Payer: Medicaid Other | Admitting: Student in an Organized Health Care Education/Training Program

## 2021-10-04 VITALS — BP 140/69 | HR 70 | Ht 75.0 in | Wt 228.0 lb

## 2021-10-04 DIAGNOSIS — F1990 Other psychoactive substance use, unspecified, uncomplicated: Secondary | ICD-10-CM

## 2021-10-04 DIAGNOSIS — F250 Schizoaffective disorder, bipolar type: Secondary | ICD-10-CM | POA: Diagnosis not present

## 2021-10-04 MED ORDER — QUETIAPINE FUMARATE 200 MG PO TABS: 200.0000 mg | ORAL_TABLET | Freq: Every day | ORAL | 3 refills | Status: DC

## 2021-10-04 MED ORDER — DIVALPROEX SODIUM 250 MG PO DR TAB: 250.0000 mg | DELAYED_RELEASE_TABLET | Freq: Two times a day (BID) | ORAL | 2 refills | Status: DC
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
## 2021-10-07 ENCOUNTER — Other Ambulatory Visit (HOSPITAL_COMMUNITY): Payer: Self-pay | Admitting: *Deleted

## 2021-10-08 LAB — COMPREHENSIVE METABOLIC PANEL
ALT: 13 IU/L (ref 0–44)
AST: 22 IU/L (ref 0–40)
Albumin/Globulin Ratio: 1.5 (ref 1.2–2.2)
Albumin: 4.3 g/dL (ref 3.8–4.8)
Alkaline Phosphatase: 82 IU/L (ref 44–121)
BUN/Creatinine Ratio: 19 (ref 10–24)
BUN: 18 mg/dL (ref 8–27)
Bilirubin Total: 0.3 mg/dL (ref 0.0–1.2)
CO2: 28 mmol/L (ref 20–29)
Calcium: 9.4 mg/dL (ref 8.6–10.2)
Chloride: 99 mmol/L (ref 96–106)
Creatinine, Ser: 0.95 mg/dL (ref 0.76–1.27)
Globulin, Total: 2.8 g/dL (ref 1.5–4.5)
Glucose: 107 mg/dL — ABNORMAL HIGH (ref 70–99)
Potassium: 5.3 mmol/L — ABNORMAL HIGH (ref 3.5–5.2)
Sodium: 137 mmol/L (ref 134–144)
Total Protein: 7.1 g/dL (ref 6.0–8.5)
eGFR: 91 mL/min/{1.73_m2} (ref >59)

## 2021-10-08 LAB — VALPROIC ACID LEVEL: Valproic Acid Lvl: 18 ug/mL — ABNORMAL LOW (ref 50–100)

## 2021-10-13 ENCOUNTER — Ambulatory Visit: Payer: Medicaid Other | Admitting: Family Medicine

## 2021-10-24 ENCOUNTER — Other Ambulatory Visit: Payer: Self-pay | Admitting: Family Medicine

## 2021-10-24 DIAGNOSIS — G2581 Restless legs syndrome: Secondary | ICD-10-CM

## 2021-10-24 DIAGNOSIS — M545 Low back pain, unspecified: Secondary | ICD-10-CM

## 2021-10-26 NOTE — Telephone Encounter (Signed)
Pt called, advised him that I had rx requests and was checking to see if he was switching practices. Pt states that he is having to switch to [facility] d/t being unable to get to [facility] from [facility] on 09/14/21. Pt asked that if rx can be refilled that would be great. Pt was also asking about receiving the papers from yesterday he gave front desk for smoking cessation assistance. I reviewed chart and explained that I didn't see any forms entered at this time and may just not have been entered yet. Pt was advised by front desk to call back Thurs in regards to those papers per pt. I advised him to call back Thurs to check on status and that I would send rx requests to Dr. B for refill.  ?

## 2021-11-01 ENCOUNTER — Ambulatory Visit: Payer: Self-pay | Admitting: Dermatology

## 2021-11-08 ENCOUNTER — Ambulatory Visit (HOSPITAL_COMMUNITY): Payer: Medicaid Other | Admitting: Licensed Clinical Social Worker

## 2021-11-12 ENCOUNTER — Telehealth: Payer: Self-pay | Admitting: Nurse Practitioner

## 2021-11-12 ENCOUNTER — Ambulatory Visit: Payer: Medicaid Other | Attending: Nurse Practitioner | Admitting: Nurse Practitioner

## 2021-11-12 ENCOUNTER — Encounter: Payer: Self-pay | Admitting: Nurse Practitioner

## 2021-11-12 ENCOUNTER — Ambulatory Visit
Admission: RE | Admit: 2021-11-12 | Discharge: 2021-11-12 | Disposition: A | Discharge status: Home / Self Care | Payer: Medicaid Other | Source: Ambulatory Visit | Attending: Nurse Practitioner | Admitting: Nurse Practitioner

## 2021-11-12 VITALS — BP 103/63 | HR 54 | Ht 75.0 in | Wt 223.2 lb

## 2021-11-12 DIAGNOSIS — Z0189 Encounter for other specified special examinations: Secondary | ICD-10-CM | POA: Diagnosis not present

## 2021-11-12 DIAGNOSIS — Z8673 Personal history of transient ischemic attack (TIA), and cerebral infarction without residual deficits: Secondary | ICD-10-CM | POA: Insufficient documentation

## 2021-11-12 DIAGNOSIS — K219 Gastro-esophageal reflux disease without esophagitis: Secondary | ICD-10-CM | POA: Diagnosis not present

## 2021-11-12 DIAGNOSIS — Z7689 Persons encountering health services in other specified circumstances: Secondary | ICD-10-CM | POA: Diagnosis not present

## 2021-11-12 DIAGNOSIS — F319 Bipolar disorder, unspecified: Secondary | ICD-10-CM | POA: Diagnosis not present

## 2021-11-12 DIAGNOSIS — J449 Chronic obstructive pulmonary disease, unspecified: Secondary | ICD-10-CM | POA: Insufficient documentation

## 2021-11-12 DIAGNOSIS — M5412 Radiculopathy, cervical region: Secondary | ICD-10-CM | POA: Insufficient documentation

## 2021-11-12 DIAGNOSIS — M79672 Pain in left foot: Secondary | ICD-10-CM | POA: Insufficient documentation

## 2021-11-12 DIAGNOSIS — F259 Schizoaffective disorder, unspecified: Secondary | ICD-10-CM | POA: Insufficient documentation

## 2021-11-12 DIAGNOSIS — G8929 Other chronic pain: Secondary | ICD-10-CM | POA: Insufficient documentation

## 2021-11-12 DIAGNOSIS — F419 Anxiety disorder, unspecified: Secondary | ICD-10-CM | POA: Insufficient documentation

## 2021-11-12 DIAGNOSIS — I10 Essential (primary) hypertension: Secondary | ICD-10-CM

## 2021-11-12 DIAGNOSIS — I252 Old myocardial infarction: Secondary | ICD-10-CM | POA: Insufficient documentation

## 2021-11-12 DIAGNOSIS — R053 Chronic cough: Secondary | ICD-10-CM | POA: Diagnosis not present

## 2021-11-12 DIAGNOSIS — M1990 Unspecified osteoarthritis, unspecified site: Secondary | ICD-10-CM | POA: Insufficient documentation

## 2021-11-12 DIAGNOSIS — Z87891 Personal history of nicotine dependence: Secondary | ICD-10-CM | POA: Diagnosis not present

## 2021-11-12 DIAGNOSIS — M7032 Other bursitis of elbow, left elbow: Secondary | ICD-10-CM | POA: Diagnosis not present

## 2021-11-12 DIAGNOSIS — U099 Post covid-19 condition, unspecified: Secondary | ICD-10-CM

## 2021-11-12 DIAGNOSIS — R0602 Shortness of breath: Secondary | ICD-10-CM | POA: Insufficient documentation

## 2021-11-12 DIAGNOSIS — Z7901 Long term (current) use of anticoagulants: Secondary | ICD-10-CM | POA: Insufficient documentation

## 2021-11-12 DIAGNOSIS — E785 Hyperlipidemia, unspecified: Secondary | ICD-10-CM | POA: Diagnosis not present

## 2021-11-12 DIAGNOSIS — Z79899 Other long term (current) drug therapy: Secondary | ICD-10-CM | POA: Insufficient documentation

## 2021-11-12 DIAGNOSIS — I2510 Atherosclerotic heart disease of native coronary artery without angina pectoris: Secondary | ICD-10-CM | POA: Diagnosis not present

## 2021-11-12 DIAGNOSIS — L711 Rhinophyma: Secondary | ICD-10-CM | POA: Diagnosis not present
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
## 2021-11-12 NOTE — Telephone Encounter (Signed)
Copied from [place] [id number] [phone]. Topic: Referral - Question ?>> Nov 12, 2021  2:38 PM Pawlus, Brayton Layman A wrote: ?Reason for CRM: Pt wanted to know if the referral for dermatology could be sent over to [facility] [place], [place], [place] [address]. ?

## 2021-11-15 ENCOUNTER — Encounter (HOSPITAL_COMMUNITY): Payer: Self-pay | Admitting: Student in an Organized Health Care Education/Training Program

## 2021-11-15 ENCOUNTER — Other Ambulatory Visit: Payer: Self-pay | Admitting: Nurse Practitioner

## 2021-11-15 ENCOUNTER — Ambulatory Visit (INDEPENDENT_AMBULATORY_CARE_PROVIDER_SITE_OTHER): Payer: Medicaid Other | Admitting: Student in an Organized Health Care Education/Training Program

## 2021-11-15 DIAGNOSIS — F250 Schizoaffective disorder, bipolar type: Secondary | ICD-10-CM

## 2021-11-15 DIAGNOSIS — J180 Bronchopneumonia, unspecified organism: Secondary | ICD-10-CM

## 2021-11-15 DIAGNOSIS — J439 Emphysema, unspecified: Secondary | ICD-10-CM

## 2021-11-15 MED ORDER — QUETIAPINE FUMARATE 200 MG PO TABS: 200.0000 mg | ORAL_TABLET | Freq: Every day | ORAL | 3 refills | Status: DC

## 2021-11-15 MED ORDER — DIVALPROEX SODIUM 250 MG PO DR TAB: 250.0000 mg | DELAYED_RELEASE_TABLET | Freq: Two times a day (BID) | ORAL | 2 refills | Status: DC

## 2021-11-15 MED ORDER — AZITHROMYCIN 250 MG PO TABS: ORAL_TABLET | ORAL | 0 refills | Status: AC
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
## 2021-11-16 ENCOUNTER — Telehealth: Payer: Self-pay | Admitting: Nurse Practitioner

## 2021-11-16 NOTE — Telephone Encounter (Signed)
Copied from [place] [id number]. Topic: Referral - Status >> Nov 16, 2021  9:52 AM Keith Gibbs E wrote: Reason for CRM: Pt called and didn't want his referral sent to [place]  Keith Gibbs, [place] [address]  He would like and asked if his referral can be for [facility] at [place]/ this is where his cardiology would like him to go/ please advise

## 2021-11-17 ENCOUNTER — Ambulatory Visit: Payer: Medicaid Other | Admitting: Nurse Practitioner

## 2021-11-24 ENCOUNTER — Ambulatory Visit: Payer: Medicaid Other | Admitting: Internal Medicine

## 2021-11-24 NOTE — Progress Notes (Deleted)
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 then worse sob since around 2010 [place] dx copd some better with "inhalers"      History of Present Illness  07/21/2020  Pulmonary/ 1st office eval/ Keith Gibbs / Keith Gibbs Office / maint on dulera/ active smoker  Chief Complaint  Patient presents with   Consult    Productive cough with yellow/brown phlegm since July 2021, shortness of breath at rest and activity  Dyspnea:  [employer] slow pace ok on dulera 8 am and w/in a few hours used saba  Cough: cough x July 21 as above, wore in am's Sleep: flat bed 2 pillows / inhaler once a week SABA use: too much p activity, rarely ever at rest 02 none rec Plan A = Automatic = Always=    Breztri Take 2 puffs first thing in am and then another 2 puffs about 12 hours later.  Work on inhaler technique: Plan B = Backup (to supplement plan A, not to replace it) Only use your albuterol inhaler as a rescue medication Zpak Prednisone 10 mg take  4 each am x 2 days,   2 each am x 2 days,  1 each am x 2 days and stop  08/06/20 PC  pred x 6 days for flare    08/21/2020  f/u ov/Keith Gibbs office/Keith Gibbs re: doe Chief Complaint  Patient presents with   Follow-up    Shortness of breath with activity  Dyspnea:  Improved over baseline and always better off pred Cough: min rattling in am / mucoid and better off cigs Sleeping: ok  SABA use: once a day at more  02: none  Rec Plan A = Automatic = Always=    breztri Take 2 puffs first thing in am and then another 2 puffs about 12 hours later.  Plan B = Backup (to supplement plan A, not to replace it) Only use your albuterol inhaler as a rescue medication  Plan C = Crisis - breathing getting worse despite A and B > Prednisone 10 mg take  4 each am x 2 days,   2 each am x 2 days,  1 each am  x 2 days and stop      11/24/2021  f/u ov/Keith Gibbs re: ***   maint on ***   Needs spirometry  No chief complaint on file.   Dyspnea:  *** Cough: *** Sleeping: *** SABA use: *** 02: *** Covid status:   ***   No obvious day to day or daytime variability or assoc excess/ purulent sputum or mucus plugs or hemoptysis or cp or chest tightness, subjective wheeze or overt sinus or hb symptoms.   *** without nocturnal  or early am exacerbation  of respiratory  c/o's or need for noct saba. Also denies any obvious fluctuation of symptoms with weather or environmental changes or other aggravating or alleviating factors except as outlined above   No unusual exposure hx or h/o childhood pna/ asthma or knowledge of premature birth.  Current Allergies, Complete Past Medical History, Past Surgical History, Family History, and Social History were reviewed in [employer] record.  ROS  The following are not active complaints unless bolded Hoarseness, sore throat, dysphagia, dental problems, itching, sneezing,  nasal congestion or discharge of excess mucus or purulent  secretions, ear ache,   fever, chills, sweats, unintended wt loss or wt gain, classically pleuritic or exertional cp,  orthopnea pnd or arm/hand swelling  or leg swelling, presyncope, palpitations, abdominal pain, anorexia, nausea, vomiting, diarrhea  or change in bowel habits or change in bladder habits, change in stools or change in urine, dysuria, hematuria,  rash, arthralgias, visual complaints, headache, numbness, weakness or ataxia or problems with walking or coordination,  change in mood or  memory.        No outpatient medications have been marked as taking for the 11/24/21 encounter (Appointment) with Keith Rockers, MD.           Past Medical History:  Diagnosis Date   Anxiety    Arthritis    Bursitis    Chronic pain    COPD (chronic obstructive pulmonary disease) ([place])    Depression    GERD  (gastroesophageal reflux disease)    Hepatitis C 2012   No longer has Hep C   Hypertension    Lung disease    MI (myocardial infarction) ([place])    Stroke ([place])    Substance abuse ([place])               Objective:    Wts   11/24/2021         ***   08/21/20 219 lb (99.3 kg)  07/21/20 208 lb 12.8 oz (94.7 kg)  02/13/20 220 lb 7.4 oz (100 kg)    Vital signs reviewed  11/24/2021  - Note at rest 02 sats  ***% on ***   General appearance:    ***      Mod bar***                    Assessment

## 2021-11-26 NOTE — Telephone Encounter (Signed)
Patient spoke with [facility]  [place], [place], [place] [address] phone [phone], they informed patient referral was never received  and patient would like referral re faxed and a follow up call when completed, please leave message if patient does not anwer ?

## 2021-12-14 ENCOUNTER — Ambulatory Visit (INDEPENDENT_AMBULATORY_CARE_PROVIDER_SITE_OTHER): Payer: Medicaid Other | Admitting: Internal Medicine

## 2021-12-14 ENCOUNTER — Encounter: Payer: Self-pay | Admitting: Internal Medicine

## 2021-12-14 DIAGNOSIS — J449 Chronic obstructive pulmonary disease, unspecified: Secondary | ICD-10-CM

## 2021-12-14 DIAGNOSIS — F17210 Nicotine dependence, cigarettes, uncomplicated: Secondary | ICD-10-CM | POA: Diagnosis not present

## 2021-12-14 MED ORDER — BREZTRI AEROSPHERE 160-9-4.8 MCG/ACT IN AERO: 2.0000 | INHALATION_SPRAY | Freq: Two times a day (BID) | RESPIRATORY_TRACT | 0 refills | Status: DC

## 2021-12-14 MED ORDER — BREZTRI AEROSPHERE 160-9-4.8 MCG/ACT IN AERO: INHALATION_SPRAY | RESPIRATORY_TRACT | 11 refills | Status: DC

## 2021-12-14 MED ORDER — PREDNISONE 10 MG PO TABS: ORAL_TABLET | ORAL | 0 refills | Status: DC

## 2021-12-14 MED ORDER — AZITHROMYCIN 250 MG PO TABS: ORAL_TABLET | ORAL | 0 refills | Status: DC

## 2021-12-14 NOTE — Assessment & Plan Note (Addendum)
MM/Active smoker  - Labs ordered 07/21/2020  :  allergy profile EOS  0.2 / IgE <2    alpha one AT phenotype  MM - 12/14/2021  After extensive coaching inhaler device,  effectiveness =    75% > retry breztri   DDX of  difficult airways management almost all start with A and  include Adherence, Ace Inhibitors, Acid Reflux, Active Sinus Disease, Alpha 1 Antitripsin deficiency, Anxiety masquerading as Airways dz,  ABPA,  Allergy(esp in young), Aspiration (esp in elderly), Adverse effects of meds,  Active smoking or vaping, A bunch of PE's (a small clot burden can't cause this syndrome unless there is already severe underlying pulm or vascular dz with poor reserve) plus two Bs  = Bronchiectasis and Beta blocker use..and one C= CHF   Adherence is always the initial "prime suspect" and is a multilayered concern that requires a "trust but verify" approach in every patient - starting with knowing how to use medications, especially inhalers, correctly, keeping up with refills and understanding the fundamental difference between maintenance and prns vs those medications only taken for a very short course and then stopped and not refilled.  - see hfa teaching - return with all meds in hand using a trust but verify approach to confirm accurate Medication  Reconciliation The principal here is that until we are certain that the  patients are doing what we've asked, it makes no sense to ask them to do more.   Active smoking also at top of the list of usual suspects > see sep a/p  ? Allergy/asthma component > breztri adequate if he'll use it effectively/ consistently  >>> Prednisone 10 mg take  4 each am x 2 days,   2 each am x 2 days,  1 each am x 2 days and stop   ? Active sinus dz/ rhinitis> bronchitis  > continue flonase/ prn otc h1, zpak should cover as don't really suspect much sinusitis here   ? Acid (or non-acid) GERD > always difficult to exclude as up to 75% of pts in some series report no assoc GI/  Heartburn symptoms> rec continue ppi  F/u in 6 weeks with PFTs          Each maintenance medication was reviewed in detail including emphasizing most importantly the difference between maintenance and prns and under what circumstances the prns are to be triggered using an action plan format where appropriate.  Total time for H and P, chart review, counseling, reviewing hfa device(s) and generating customized AVS unique to this office visit / same day charting > 30 min

## 2021-12-14 NOTE — Progress Notes (Signed)
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 [place] dx copd some better with "inhalers"      History of Present Illness  07/21/2020  Pulmonary/ 1st office eval/ Keith Gibbs / [name] / maint on dulera/ active smoker  Chief Complaint  Patient presents with   Consult    Productive cough with yellow/brown phlegm since July 2021, shortness of breath at rest and activity  Dyspnea:  [employer] slow pace ok on dulera 8 am and w/in a few hours used saba  Cough: cough x July 21 as above, wore in am's Sleep: flat bed 2 pillows / inhaler once a week SABA use: too much p activity, rarely ever at rest 02 none rec Plan A = Automatic = Always=    Breztri Take 2 puffs first thing in am and then another 2 puffs about 12 hours later.  Work on inhaler technique: Plan B = Backup (to supplement plan A, not to replace it) Only use your albuterol inhaler as a rescue medication Zpak Prednisone 10 mg take  4 each am x 2 days,   2 each am x 2 days,  1 each am x 2 days and stop  08/06/20 PC  pred x 6 days for flare    08/21/2020  f/u ov/[place] office/Keith Gibbs re: doe Chief Complaint  Patient presents with   Follow-up    Shortness of breath with activity  Dyspnea:  Improved over baseline and always better off pred Cough: min rattling in am / mucoid and better off cigs Sleeping: ok  SABA use: once a day at more  02: none  Rec Plan A = Automatic = Always=    breztri Take 2 puffs first thing in am and then another 2 puffs about 12 hours later.  Plan B = Backup (to supplement plan A, not to replace it) Only use your albuterol inhaler as a rescue medication  Plan C = Crisis - breathing getting worse despite A and B > Prednisone 10 mg take  4 each am x 2 days,   2 each am x 2 days,  1 each am  x 2 days and stop      12/14/2021  f/u ov/Keith Gibbs re: AB   maint on  ? (Not sure of meds)  Chief Complaint  Patient presents with   Follow-up    Had quit smoking, but started cheating again smoking 5 cigarettes per day.  Using patches/gum.  Dyspnea:  walks store from house to store once a week which is mostly flat  Cough: rattling esp  in am/ slt yellow mucus   Sleeping: able to sleep can hear himself  SABA use: not sure which inhalers he's using at this point  02: none  Covid status:   vax x 3    No obvious day to day or daytime variability or assoc excess/ purulent sputum or mucus plugs or hemoptysis or cp or chest tightness,  or overt sinus or hb symptoms.    Also denies any obvious fluctuation of symptoms with weather or environmental changes or other aggravating or alleviating factors except as outlined above   No unusual exposure hx or h/o childhood pna/ asthma or knowledge of premature birth.  Current Allergies, Complete Past Medical History,  Past Surgical History, Family History, and Social History were reviewed in [employer] record.  ROS  The following are not active complaints unless bolded Hoarseness, sore throat, dysphagia, dental problems, itching, sneezing,  nasal congestion or discharge of excess mucus or purulent secretions, ear ache,   fever, chills, sweats, unintended wt loss or wt gain, classically pleuritic or exertional cp,  orthopnea pnd or arm/hand swelling  or leg swelling, presyncope, palpitations, abdominal pain, anorexia, nausea, vomiting, diarrhea  or change in bowel habits or change in bladder habits, change in stools or change in urine, dysuria, hematuria,  rash, arthralgias, visual complaints, headache, numbness C8 on R, weakness or ataxia or problems with walking or coordination,  change in mood or  memory.        Current Meds -   - NOTE:   Unable to verify as accurately reflecting what pt takes    Medication Sig   acetaminophen  (TYLENOL) 500 MG tablet Take 2 tablets (1,000 mg total) by mouth every 6 (six) hours. (Patient taking differently: Take 1,000 mg by mouth every 6 (six) hours as needed for moderate pain.)   albuterol (VENTOLIN HFA) 108 (90 Base) MCG/ACT inhaler Inhale 2 puffs into the lungs every 6 (six) hours as needed for wheezing or shortness of breath.   aspirin EC 81 MG tablet Take 81 mg by mouth daily.   atorvastatin (LIPITOR) 80 MG tablet Take 80 mg by mouth daily.   azelastine (ASTELIN) 0.1 % nasal spray Place into both nostrils daily. Use in each nostril as directed   Budeson-Glycopyrrol-Formoterol (BREZTRI AEROSPHERE) 160-9-4.8 MCG/ACT AERO Inhale 2 puffs into the lungs 2 (two) times daily.   cetirizine (ZYRTEC) 10 MG tablet TAKE 1 TABLET BY MOUTH EVERY DAY (Patient taking differently: Take 10 mg by mouth daily.)   clopidogrel (PLAVIX) 75 MG tablet Take 75 mg by mouth daily.   cyclobenzaprine (FLEXERIL) 10 MG tablet TAKE 1 TABLET BY MOUTH THREE TIMES A DAY   divalproex (DEPAKOTE) 250 MG DR tablet Take 1 tablet (250 mg total) by mouth 2 (two) times daily.   DULoxetine (CYMBALTA) 60 MG capsule TAKE 1 CAPSULE BY MOUTH EVERY DAY (Patient taking differently: Take 60 mg by mouth daily.)   fluticasone (FLONASE) 50 MCG/ACT nasal spray Place 1 spray into both nostrils at bedtime.   loratadine (CLARITIN) 10 MG tablet Take 10 mg by mouth daily.   meloxicam (MOBIC) 15 MG tablet TAKE 1 TABLET (15 MG TOTAL) BY MOUTH DAILY.   Multiple Vitamin (MULTIVITAMIN) tablet Take 1 tablet by mouth daily.   naproxen (NAPROSYN) 375 MG tablet Take 1 tablet (375 mg total) by mouth 2 (two) times daily.   nitroGLYCERIN (NITROSTAT) 0.3 MG SL tablet    Omega-3 Fatty Acids (FISH OIL) 1200 MG CAPS Take 1,200 mg by mouth daily.   omeprazole (PRILOSEC) 40 MG capsule TAKE 1 CAPSULE (40 MG TOTAL) BY MOUTH DAILY.   predniSONE (DELTASONE) 10 MG tablet PLEASE SEE ATTACHED FOR DETAILED DIRECTIONS   QUEtiapine (SEROQUEL) 200 MG tablet Take 1  tablet (200 mg total) by mouth at bedtime.   rOPINIRole (REQUIP) 2 MG tablet TAKE 1 TABLET BY MOUTH EVERYDAY AT BEDTIME   SUBOXONE 8-2 MG FILM Place 1 Film under the tongue 2 (two) times daily.   tamsulosin (FLOMAX) 0.4 MG CAPS capsule TAKE 1 CAPSULE BY MOUTH EVERY DAY (Patient taking differently: Take 0.4 mg by mouth daily.)           Past Medical History:  Diagnosis Date  Anxiety    Arthritis    Bursitis    Chronic pain    COPD (chronic obstructive pulmonary disease) (HCC)    Depression    GERD (gastroesophageal reflux disease)    Hepatitis C 2012   No longer has Hep C   Hypertension    Lung disease    MI (myocardial infarction) ([place])    Stroke ([place])    Substance abuse ([place])               Objective:    Wts  12/14/2021       214   08/21/20 219 lb (99.3 kg)  07/21/20 208 lb 12.8 oz (94.7 kg)  02/13/20 220 lb 7.4 oz (100 kg)    Vital signs reviewed  12/14/2021  - Note at rest 02 sats  97% on RA   General appearance:    amb wm inconsistent historian/ easily confused with details of care     HEENT :  Oropharynx  clear   Nasal turbintes  mild edema/ no purulent secretions    NECK :  without JVD/Nodes/TM/ nl carotid upstrokes bilaterally   LUNGS: no acc muscle use,  Mod barrel  contour chest wall with bilateral  Distant bs s audible wheeze and  without cough on insp or exp maneuvers and mod  Hyperresonant  to  percussion bilaterally     CV:  RRR  no s3 or murmur or increase in P2, and no edema   ABD:  soft and nontender with pos mid insp Hoover's  in the supine position. No bruits or organomegaly appreciated, bowel sounds nl  MS:   Ext warm without deformities or   obvious joint restrictions , calf tenderness, cyanosis or clubbing  SKIN: warm and dry without lesions    NEURO:  alert, approp, nl sensorium with  no motor or cerebellar deficits apparent.            I personally reviewed images and agree with radiology impression as follows:  CXR:    11/12/21 Background emphysema with increased bronchial wall thickening and reticular opacities suggesting superimposed bronchitis/bronchopneumonia. Sequela of atypical infection could also have this appearance           Assessment

## 2021-12-14 NOTE — Patient Instructions (Addendum)
Zpak   Prednisone 10 mg take  4 each am x 2 days,   2 each am x 2 days,  1 each am x 2 days and stop   Plan A = Automatic = Always=    Breztri Take 2 puffs first thing in am and then another 2 puffs about 12 hours later.    Work on inhaler technique:  relax and gently blow all the way out then take a nice smooth full deep breath back in, triggering the inhaler at same time you start breathing in.  Hold for up to 5 seconds if you can. Blow out thru nose. Rinse and gargle with water when done.  If mouth or throat bother you at all,  try brushing teeth/gums/tongue with arm and hammer toothpaste/ make a slurry and gargle and spit out.       Plan B = Backup (to supplement plan A, not to replace it) Only use your albuterol inhaler as a rescue medication to be used if you can't catch your breath by resting or doing a relaxed purse lip breathing pattern.  - The less you use it, the better it will work when you need it. - Ok to use the inhaler up to 2 puffs  every 4 hours if you must but call for appointment if use goes up over your usual need - Don't leave home without it !!  (think of it like the spare tire for your car)   The key is to stop smoking completely before smoking completely stops you!      Please schedule a follow up office visit in 6 weeks, call sooner if needed PFTs  and bring your medications with you

## 2021-12-14 NOTE — Assessment & Plan Note (Signed)
4-5 min discussion re active cigarette smoking in addition to office E&M  Ask about tobacco use:   ongoing Advise quitting   I took an extended  opportunity with this patient to outline the consequences of continued cigarette use  in airway disorders based on all the data we have from the multiple national lung health studies (perfomed over decades at millions of dollars in cost)  indicating that smoking cessation, not choice of inhalers or physicians, is the most important aspect of his care  Assess willingness:  Not committed at this point Assist in quit attempt:  Per PCP when ready Arrange follow up:   Follow up per Primary Care planned    Low-dose CT lung cancer screening is recommended for patients who are 47-43 years of age with a 20+ pack-year history of smoking and who are currently smoking or quit <=15 years ago. No coughing up blood  No unintentional weight loss of > 15 pounds in the last 6 months - pt is eligible for scanning yearly for 15 y p quits, will arrange for shared decision making

## 2021-12-15 ENCOUNTER — Other Ambulatory Visit (HOSPITAL_COMMUNITY): Payer: Self-pay

## 2021-12-15 ENCOUNTER — Telehealth: Payer: Self-pay

## 2021-12-15 NOTE — Telephone Encounter (Signed)
Patient Advocate Encounter   Received notification that prior authorization for BREZTRI AREOSPHERE 160-9.4.8 MCG INHALER is required.   PA submitted on 12/15/2021 [employer] Confirmation #:[id number] W Status is pending

## 2021-12-16 ENCOUNTER — Emergency Department (HOSPITAL_COMMUNITY): Payer: Medicaid Other

## 2021-12-16 ENCOUNTER — Encounter (HOSPITAL_COMMUNITY): Payer: Self-pay | Admitting: Emergency Medicine

## 2021-12-16 ENCOUNTER — Observation Stay (HOSPITAL_COMMUNITY)
Admission: EM | Admit: 2021-12-16 | Discharge: 2021-12-17 | Disposition: A | Discharge status: Home / Self Care | Payer: Medicaid Other | Attending: Emergency Medicine | Admitting: Emergency Medicine

## 2021-12-16 DIAGNOSIS — Z79899 Other long term (current) drug therapy: Secondary | ICD-10-CM | POA: Insufficient documentation

## 2021-12-16 DIAGNOSIS — Z7982 Long term (current) use of aspirin: Secondary | ICD-10-CM | POA: Diagnosis not present

## 2021-12-16 DIAGNOSIS — R1013 Epigastric pain: Secondary | ICD-10-CM

## 2021-12-16 DIAGNOSIS — Z96651 Presence of right artificial knee joint: Secondary | ICD-10-CM | POA: Diagnosis not present

## 2021-12-16 DIAGNOSIS — Z20822 Contact with and (suspected) exposure to covid-19: Secondary | ICD-10-CM | POA: Insufficient documentation

## 2021-12-16 DIAGNOSIS — Z7901 Long term (current) use of anticoagulants: Secondary | ICD-10-CM | POA: Insufficient documentation

## 2021-12-16 DIAGNOSIS — Z7902 Long term (current) use of antithrombotics/antiplatelets: Secondary | ICD-10-CM | POA: Insufficient documentation

## 2021-12-16 DIAGNOSIS — I110 Hypertensive heart disease with heart failure: Secondary | ICD-10-CM | POA: Insufficient documentation

## 2021-12-16 DIAGNOSIS — J449 Chronic obstructive pulmonary disease, unspecified: Secondary | ICD-10-CM | POA: Diagnosis present

## 2021-12-16 DIAGNOSIS — G8929 Other chronic pain: Secondary | ICD-10-CM | POA: Insufficient documentation

## 2021-12-16 DIAGNOSIS — F17210 Nicotine dependence, cigarettes, uncomplicated: Secondary | ICD-10-CM | POA: Insufficient documentation

## 2021-12-16 DIAGNOSIS — M7918 Myalgia, other site: Secondary | ICD-10-CM | POA: Diagnosis present

## 2021-12-16 DIAGNOSIS — R911 Solitary pulmonary nodule: Secondary | ICD-10-CM | POA: Diagnosis present

## 2021-12-16 DIAGNOSIS — I25110 Atherosclerotic heart disease of native coronary artery with unstable angina pectoris: Principal | ICD-10-CM | POA: Insufficient documentation

## 2021-12-16 DIAGNOSIS — I200 Unstable angina: Secondary | ICD-10-CM | POA: Diagnosis present

## 2021-12-16 DIAGNOSIS — Z955 Presence of coronary angioplasty implant and graft: Secondary | ICD-10-CM | POA: Insufficient documentation

## 2021-12-16 DIAGNOSIS — I5089 Other heart failure: Secondary | ICD-10-CM | POA: Insufficient documentation

## 2021-12-16 DIAGNOSIS — I2510 Atherosclerotic heart disease of native coronary artery without angina pectoris: Secondary | ICD-10-CM

## 2021-12-16 DIAGNOSIS — Z8673 Personal history of transient ischemic attack (TIA), and cerebral infarction without residual deficits: Secondary | ICD-10-CM | POA: Diagnosis not present

## 2021-12-16 DIAGNOSIS — R2991 Unspecified symptoms and signs involving the musculoskeletal system: Secondary | ICD-10-CM | POA: Diagnosis not present

## 2021-12-16 DIAGNOSIS — F1190 Opioid use, unspecified, uncomplicated: Secondary | ICD-10-CM | POA: Diagnosis present

## 2021-12-16 DIAGNOSIS — R112 Nausea with vomiting, unspecified: Secondary | ICD-10-CM

## 2021-12-16 DIAGNOSIS — J441 Chronic obstructive pulmonary disease with (acute) exacerbation: Secondary | ICD-10-CM | POA: Diagnosis present

## 2021-12-16 LAB — COMPREHENSIVE METABOLIC PANEL
ALT: 16 U/L (ref 0–44)
AST: 19 U/L (ref 15–41)
Albumin: 4.3 g/dL (ref 3.5–5.0)
Alkaline Phosphatase: 68 U/L (ref 38–126)
Anion gap: 9 (ref 5–15)
BUN: 18 mg/dL (ref 8–23)
CO2: 25 mmol/L (ref 22–32)
Calcium: 8.8 mg/dL — ABNORMAL LOW (ref 8.9–10.3)
Chloride: 104 mmol/L (ref 98–111)
Creatinine, Ser: 0.77 mg/dL (ref 0.61–1.24)
GFR, Estimated: >60 mL/min (ref >60)
Glucose, Bld: 107 mg/dL — ABNORMAL HIGH (ref 70–99)
Potassium: 3.4 mmol/L — ABNORMAL LOW (ref 3.5–5.1)
Sodium: 138 mmol/L (ref 135–145)
Total Bilirubin: 0.5 mg/dL (ref 0.3–1.2)
Total Protein: 8.1 g/dL (ref 6.5–8.1)

## 2021-12-16 LAB — URINALYSIS, ROUTINE W REFLEX MICROSCOPIC
Bacteria, UA: NONE SEEN
Bilirubin Urine: NEGATIVE
Glucose, UA: NEGATIVE mg/dL
Ketones, ur: NEGATIVE mg/dL
Leukocytes,Ua: NEGATIVE
Nitrite: NEGATIVE
Protein, ur: NEGATIVE mg/dL
Specific Gravity, Urine: 1.015 (ref 1.005–1.030)
pH: 7 (ref 5.0–8.0)

## 2021-12-16 LAB — CBC WITH DIFFERENTIAL/PLATELET
Abs Immature Granulocytes: 0.04 10*3/uL (ref 0.00–0.07)
Basophils Absolute: 0 10*3/uL (ref 0.0–0.1)
Basophils Relative: 0 %
Eosinophils Absolute: 0.1 10*3/uL (ref 0.0–0.5)
Eosinophils Relative: 1 %
HCT: 37.6 % — ABNORMAL LOW (ref 39.0–52.0)
Hemoglobin: 13 g/dL (ref 13.0–17.0)
Immature Granulocytes: 0 %
Lymphocytes Relative: 23 %
Lymphs Abs: 2.6 10*3/uL (ref 0.7–4.0)
MCH: 31.9 pg (ref 26.0–34.0)
MCHC: 34.6 g/dL (ref 30.0–36.0)
MCV: 92.4 fL (ref 80.0–100.0)
Monocytes Absolute: 0.7 10*3/uL (ref 0.1–1.0)
Monocytes Relative: 6 %
Neutro Abs: 8.2 10*3/uL — ABNORMAL HIGH (ref 1.7–7.7)
Neutrophils Relative %: 70 %
Platelets: 293 10*3/uL (ref 150–400)
RBC: 4.07 MIL/uL — ABNORMAL LOW (ref 4.22–5.81)
RDW: 14.1 % (ref 11.5–15.5)
WBC: 11.7 10*3/uL — ABNORMAL HIGH (ref 4.0–10.5)
nRBC: 0 % (ref 0.0–0.2)

## 2021-12-16 LAB — CK: Total CK: 99 U/L (ref 49–397)

## 2021-12-16 LAB — TROPONIN I (HIGH SENSITIVITY): Troponin I (High Sensitivity): 23 ng/L — ABNORMAL HIGH (ref <18)

## 2021-12-16 LAB — LIPASE, BLOOD: Lipase: 38 U/L (ref 11–51)

## 2021-12-16 MED ORDER — ALUM & MAG HYDROXIDE-SIMETH 200-200-20 MG/5ML PO SUSP
30.0000 mL | Freq: Once | ORAL | Status: AC
  Administered 2021-12-16: 30 mL via ORAL
  Filled 2021-12-16: qty 30

## 2021-12-16 MED ORDER — ONDANSETRON HCL 4 MG/2ML IJ SOLN
4.0000 mg | Freq: Once | INTRAMUSCULAR | Status: AC
Start: 2021-12-16 — End: 2021-12-16
  Administered 2021-12-16: 4 mg via INTRAVENOUS
  Filled 2021-12-16: qty 2

## 2021-12-16 MED ORDER — PANTOPRAZOLE SODIUM 40 MG IV SOLR
40.0000 mg | Freq: Once | INTRAVENOUS | Status: AC
  Administered 2021-12-16: 40 mg via INTRAVENOUS
  Filled 2021-12-16: qty 10

## 2021-12-16 MED ORDER — IOHEXOL 350 MG/ML SOLN
100.0000 mL | Freq: Once | INTRAVENOUS | Status: AC | PRN
  Administered 2021-12-16: 100 mL via INTRAVENOUS

## 2021-12-16 NOTE — ED Provider Notes (Signed)
[facility] DEPT Provider Note   CSN: [id number] Arrival date & time: 12/16/21  2031     History  Chief Complaint  Patient presents with   Abdominal Pain    Keith Gibbs is a 61 y.o. male.With past medical history of chronic pain, polysubstance abuse, COPD, GERD, CAD, HTN, schizophrenia who presents to the emergency department with abdominal pain.   States he was working in the yard earlier today when he had a sudden of epigastric abdominal pain. Describes it as severe and constant. Non-radiating. He states he has had multiple episodes of nausea and vomiting since the onset. Non-bloody. Has had intermittent lightheadedness. States that he tried drinking ginger ale with crackers and PeptoBismol without relief of symptoms. Denies chest pain or increased SOB from baseline, fevers, diarrhea or urinary symptoms. Denies frequent NSAID, tylenol, Goody powder, alcohol use.     Abdominal Pain Associated symptoms: nausea and vomiting   Associated symptoms: no chest pain, no dysuria, no fever and no shortness of breath       Home Medications Prior to Admission medications   Medication Sig Start Date End Date Taking? Authorizing Provider  acetaminophen (TYLENOL) 500 MG tablet Take 2 tablets (1,000 mg total) by mouth every 6 (six) hours. Patient taking differently: Take 1,000 mg by mouth every 6 (six) hours as needed for moderate pain. 01/20/21   Duanne Guess, PA-C  albuterol (VENTOLIN HFA) 108 (90 Base) MCG/ACT inhaler Inhale 2 puffs into the lungs every 6 (six) hours as needed for wheezing or shortness of breath. 05/28/19   Charlott Rakes, MD  aspirin EC 81 MG tablet Take 81 mg by mouth daily.    [provider]  atorvastatin (LIPITOR) 80 MG tablet Take 80 mg by mouth daily. 08/28/20   [provider]  azelastine (ASTELIN) 0.1 % nasal spray Place into both nostrils daily. Use in each nostril as directed    [provider]   azithromycin (ZITHROMAX) 250 MG tablet Take 2 on day one then 1 daily x 4 days 12/14/21   Tanda Rockers, MD  Budeson-Glycopyrrol-Formoterol (BREZTRI AEROSPHERE) 160-9-4.8 MCG/ACT AERO Take 2 puffs first thing in am and then another 2 puffs about 12 hours later. 12/14/21   Tanda Rockers, MD  Budeson-Glycopyrrol-Formoterol (BREZTRI AEROSPHERE) 160-9-4.8 MCG/ACT AERO Inhale 2 puffs into the lungs in the morning and at bedtime. 12/14/21   Tanda Rockers, MD  cetirizine (ZYRTEC) 10 MG tablet TAKE 1 TABLET BY MOUTH EVERY DAY Patient taking differently: Take 10 mg by mouth daily. 02/02/21   Virginia Crews, MD  clopidogrel (PLAVIX) 75 MG tablet Take 75 mg by mouth daily.    [provider]  cyclobenzaprine (FLEXERIL) 10 MG tablet TAKE 1 TABLET BY MOUTH THREE TIMES A DAY 09/21/21   Gwyneth Sprout, FNP  divalproex (DEPAKOTE) 250 MG DR tablet Take 1 tablet (250 mg total) by mouth 2 (two) times daily. 11/15/21 11/15/22  Freida Busman, MD  DULoxetine (CYMBALTA) 60 MG capsule TAKE 1 CAPSULE BY MOUTH EVERY DAY Patient taking differently: Take 60 mg by mouth daily. 02/02/21   Virginia Crews, MD  fluticasone (FLONASE) 50 MCG/ACT nasal spray Place 1 spray into both nostrils at bedtime.    [provider]  gabapentin (NEURONTIN) 300 MG capsule Take 1 capsule (300 mg total) by mouth 2 (two) times daily for 14 days. Patient not taking: Reported on 09/15/2021 09/14/21 09/28/21  Kommor, Debe Coder, MD  loratadine (CLARITIN) 10 MG tablet Take  10 mg by mouth daily.    [provider]  meloxicam (MOBIC) 15 MG tablet TAKE 1 TABLET (15 MG TOTAL) BY MOUTH DAILY. 10/26/21   Virginia Crews, MD  Multiple Vitamin (MULTIVITAMIN) tablet Take 1 tablet by mouth daily.    [provider]  naproxen (NAPROSYN) 375 MG tablet Take 1 tablet (375 mg total) by mouth 2 (two) times daily. 09/14/21   Kommor, [place], MD  nitroGLYCERIN (NITROSTAT) 0.3 MG SL tablet  12/01/20   [provider]   Omega-3 Fatty Acids (FISH OIL) 1200 MG CAPS Take 1,200 mg by mouth daily.    [provider]  omeprazole (PRILOSEC) 40 MG capsule TAKE 1 CAPSULE (40 MG TOTAL) BY MOUTH DAILY. 06/08/21   Bacigalupo, Dionne Bucy, MD  predniSONE (DELTASONE) 10 MG tablet Take  4 each am x 2 days,   2 each am x 2 days,  1 each am x 2 days and stop 12/14/21   Tanda Rockers, MD  QUEtiapine (SEROQUEL) 200 MG tablet Take 1 tablet (200 mg total) by mouth at bedtime. 11/15/21 11/15/22  Freida Busman, MD  rOPINIRole (REQUIP) 2 MG tablet TAKE 1 TABLET BY MOUTH EVERYDAY AT BEDTIME 10/26/21   Bacigalupo, Dionne Bucy, MD  SUBOXONE 8-2 MG FILM Place 1 Film under the tongue 2 (two) times daily. 08/27/21   [provider]  tamsulosin (FLOMAX) 0.4 MG CAPS capsule TAKE 1 CAPSULE BY MOUTH EVERY DAY Patient taking differently: Take 0.4 mg by mouth daily. 06/08/21   Virginia Crews, MD      Allergies    Anette Guarneri [lurasidone hcl], Lurasidone, and Saphris [asenapine]    Review of Systems   Review of Systems  Constitutional:  Negative for fever.  Respiratory:  Negative for shortness of breath.   Cardiovascular:  Negative for chest pain.  Gastrointestinal:  Positive for abdominal pain, nausea and vomiting.  Genitourinary:  Negative for dysuria.  Musculoskeletal:  Negative for back pain.  Neurological:  Positive for light-headedness.  All other systems reviewed and are negative.  Physical Exam Updated Vital Signs BP (!) 182/82 (BP Location: Left Arm)   Pulse (!) 54   Temp (!) 97.5 F (36.4 C) (Oral)   Resp 16   Ht '6\' 3"'$  (1.905 m)   Wt 99.8 kg   SpO2 98%   BMI 27.50 kg/m  Physical Exam Vitals and nursing note reviewed.  Constitutional:      Appearance: Normal appearance. He is ill-appearing.  HENT:     Head: Normocephalic and atraumatic.     Mouth/Throat:     Mouth: Mucous membranes are moist.     Pharynx: Oropharynx is clear.  Eyes:     General: No scleral icterus.    Extraocular Movements:  Extraocular movements intact.  Cardiovascular:     Rate and Rhythm: Regular rhythm. Bradycardia present.     Pulses: Normal pulses.          Radial pulses are 2+ on the right side and 2+ on the left side.     Heart sounds: Normal heart sounds. No murmur heard. Pulmonary:     Effort: Pulmonary effort is normal. No respiratory distress.     Breath sounds: Normal breath sounds.  Abdominal:     General: Abdomen is flat. Bowel sounds are normal. There is no distension.     Palpations: Abdomen is soft.     Tenderness: There is abdominal tenderness in the epigastric area. There is guarding. There is no rebound. Negative signs  include Murphy's sign.  Musculoskeletal:        General: Normal range of motion.     Cervical back: Neck supple.     Right lower leg: No edema.     Left lower leg: No edema.  Skin:    General: Skin is warm and dry.     Capillary Refill: Capillary refill takes less than 2 seconds.  Neurological:     General: No focal deficit present.     Mental Status: He is alert and oriented to person, place, and time.  Psychiatric:        Mood and Affect: Mood normal.        Behavior: Behavior normal.    ED Results / Procedures / Treatments   Labs (all labs ordered are listed, but only abnormal results are displayed) Labs Reviewed  COMPREHENSIVE METABOLIC PANEL - Abnormal; Notable for the following components:      Result Value   Potassium 3.4 (*)    Glucose, Bld 107 (*)    Calcium 8.8 (*)    All other components within normal limits  CBC WITH DIFFERENTIAL/PLATELET - Abnormal; Notable for the following components:   WBC 11.7 (*)    RBC 4.07 (*)    HCT 37.6 (*)    Neutro Abs 8.2 (*)    All other components within normal limits  URINALYSIS, ROUTINE W REFLEX MICROSCOPIC - Abnormal; Notable for the following components:   Hgb urine dipstick SMALL (*)    All other components within normal limits  TROPONIN I (HIGH SENSITIVITY) - Abnormal; Notable for the following  components:   Troponin I (High Sensitivity) 23 (*)    All other components within normal limits  LIPASE, BLOOD  CK   EKG None  Radiology DG Chest Portable 1 View  Result Date: 12/16/2021 CLINICAL DATA:  Epigastric pain. EXAM: PORTABLE CHEST 1 VIEW COMPARISON:  Chest x-ray 11/12/2021 FINDINGS: The heart size and mediastinal contours are within normal limits. Both lungs are clear. The visualized skeletal structures are unremarkable. IMPRESSION: No active disease. Electronically Signed   By: Ronney Asters M.D.   On: 12/16/2021 21:46   CT Angio Chest/Abd/Pel for Dissection W and/or Wo Contrast  Result Date: 12/16/2021 CLINICAL DATA:  Acute aortic syndrome (AAS) suspected. Vomiting, upper abdominal pain EXAM: CT ANGIOGRAPHY CHEST, ABDOMEN AND PELVIS TECHNIQUE: Non-contrast CT of the chest was initially obtained. Multidetector CT imaging through the chest, abdomen and pelvis was performed using the standard protocol during bolus administration of intravenous contrast. Multiplanar reconstructed images and MIPs were obtained and reviewed to evaluate the vascular anatomy. RADIATION DOSE REDUCTION: This exam was performed according to the departmental dose-optimization program which includes automated exposure control, adjustment of the mA and/or kV according to patient size and/or use of iterative reconstruction technique. CONTRAST:  165m OMNIPAQUE IOHEXOL 350 MG/ML SOLN COMPARISON:  CT abdomen pelvis 11/23/2018 FINDINGS: CTA CHEST FINDINGS Cardiovascular: Mild coronary artery calcification. Global cardiac size within normal limits. No pericardial effusion. Central pulmonary arteries are of normal caliber. The thoracic aorta is normal in course and caliber. No intramural hematoma, dissection, or aneurysm. Arch vasculature demonstrates classic anatomic configuration and is widely patent proximally. Mediastinum/Nodes: There is shotty bilateral hilar, prevascular, and right paratracheal lymphadenopathy,  possibly reactive in nature. No frankly pathologic thoracic adenopathy. Visualized thyroid is unremarkable. Esophagus is unremarkable. Lungs/Pleura: Moderate emphysema. Superimposed diffuse multifocal ground-glass pulmonary infiltrate is present demonstrating an upper lobe predominance, likely infectious or inflammatory in the acute setting. If chronic, this may reflect changes  of hypersensitivity pneumonitis or smoking related lung disease. There is mild bronchial wall thickening in keeping with airway inflammation. There is a more focal 10 mm spiculated opacity within the right upper lobe at axial image # 53/5, which may represent more focal airspace infiltrate on a underlying pulmonary nodule. No pneumothorax or pleural effusion. No central obstructing mass. Musculoskeletal: No acute bone abnormality. No lytic or blastic bone lesion. Review of the MIP images confirms the above findings. CTA ABDOMEN AND PELVIS FINDINGS VASCULAR Aorta: Normal caliber aorta without aneurysm, dissection, vasculitis or significant stenosis. Mild atherosclerotic calcification Celiac: Patent without evidence of aneurysm, dissection, vasculitis or significant stenosis. SMA: Less than 50% stenosis of the superior mesenteric artery proximally. Distally, the vessel is widely patent. No aneurysm or dissection. Renals: Single left and dual right renal arteries are widely patent and demonstrate normal vascular morphology. No aneurysm or dissection. IMA: Patent without evidence of aneurysm, dissection, vasculitis or significant stenosis. Inflow: Patent without evidence of aneurysm, dissection, vasculitis or significant stenosis. Veins: No obvious venous abnormality within the limitations of this arterial phase study. Review of the MIP images confirms the above findings. NON-VASCULAR Hepatobiliary: The gallbladder is distended. A a dependently layering stone is seen within the gallbladder lumen. A tiny calcified stone is seen within the origin  of the cystic duct, best seen on axial image # 122/6. No pericholecystic inflammatory changes identified. 8 mm simple cyst within the right hepatic lobe. Liver otherwise unremarkable. No intra or extrahepatic biliary ductal dilation Pancreas: Unremarkable. No pancreatic ductal dilatation or surrounding inflammatory changes. Spleen: Unremarkable Adrenals/Urinary Tract: Adrenal glands are unremarkable. Kidneys are normal, without renal calculi, focal lesion, or hydronephrosis. Bladder is unremarkable. Stomach/Bowel: Stomach, small bowel, and large bowel are unremarkable. Appendix is not clearly identified and is likely absent. No free intraperitoneal gas fluid. Lymphatic: No pathologic adenopathy within the abdomen and pelvis. Reproductive: Prostate is unremarkable. Other: No abdominal wall hernia. Musculoskeletal: Postsurgical changes of L4-S1 lumbar fusion with instrumentation and right L5 hemilaminectomy are identified. No acute bone abnormality. Review of the MIP images confirms the above findings. IMPRESSION: No evidence of thoracoabdominal aortic aneurysm or dissection. Mild coronary artery calcification. Multifocal pulmonary infiltrate. If acute, this may reflect changes of atypical infection. If chronic, additional considerations could include hypersensitivity pneumonitis or smoking related lung disease. Mild emphysema. Slightly spiculated opacity within the right upper lobe measuring 10 mm. Short-term follow-up imaging in 3 months is recommended to document stability or resolution and confirm or exclude the presence of an underlying focal pulmonary nodule. Cholelithiasis. Gallbladder distension with tiny calcified gallstone seen at the origin of the cystic duct. Correlation with liver enzymes is recommended to exclude early changes of acute calculus cholecystitis. Aortic Atherosclerosis (ICD10-I70.0) and Emphysema (ICD10-J43.9). Electronically Signed   By: Fidela Salisbury M.D.   On: 12/16/2021 23:12     Procedures .Critical Care Performed by: Mickie Hillier, PA-C Authorized by: Mickie Hillier, PA-C   Critical care provider statement:    Critical care time (minutes):  40   Critical care time was exclusive of:  Separately billable procedures and treating other patients   Critical care was necessary to treat or prevent imminent or life-threatening deterioration of the following conditions:  Cardiac failure   Critical care was time spent personally by me on the following activities:  Development of treatment plan with patient or surrogate, discussions with consultants, discussions with primary provider, evaluation of patient's response to treatment, examination of patient, interpretation of cardiac output measurements, obtaining history from  patient or surrogate, review of old charts, re-evaluation of patient's condition, pulse oximetry, ordering and review of laboratory studies, ordering and performing treatments and interventions and ordering and review of radiographic studies   I assumed direction of critical care for this patient from another provider in my specialty: no     Care discussed with: admitting provider      Medications Ordered in ED Medications  alum & mag hydroxide-simeth (MAALOX/MYLANTA) 200-200-20 MG/5ML suspension 30 mL (has no administration in time range)  pantoprazole (PROTONIX) injection 40 mg (has no administration in time range)  ondansetron (ZOFRAN) injection 4 mg (4 mg Intravenous Given 12/16/21 2220)  iohexol (OMNIPAQUE) 350 MG/ML injection 100 mL (100 mLs Intravenous Contrast Given 12/16/21 2239)    ED Course/ Medical Decision Making/ A&P                           Medical Decision Making Amount and/or Complexity of Data Reviewed Labs: ordered. Radiology: ordered.  Risk OTC drugs. Prescription drug management. Decision regarding hospitalization.  This patient presents to the ED for concern of abdominal pain, this involves an extensive number of  treatment options, and is a complaint that carries with it a high risk of complications and morbidity.  The differential diagnosis includes Acute hepatobiliary disease, pancreatitis, appendicitis, PUD, gastritis, SBO, diverticulitis, colitis, viral gastroenteritis, Crohn's, UC, viscous perforation   Co morbidities that complicate the patient evaluation MI, HTN, GERD, COPD, CAD, schizophrenia, stroke, polysubstance use   Additional history obtained:  Additional history obtained from: wife at bedside  External records from outside source obtained and reviewed including: most recent pulmonology and internal medicine physician notes   EKG: EKG: sinus bradycardia.   Cardiac Monitoring: The patient was maintained on a cardiac monitor.  I personally viewed and interpreted the cardiac monitored which showed an underlying rhythm of: sinus bradycardia  Lab Results: I personally ordered, reviewed, and interpreted labs. Pertinent results include: CMP K 3.4 otherwise unremarkable  CBC with WBC 11.7 likely reactive  Lipase 38, negative  CK 99 negative  UA negative  Troponin 23, 48, delta +25  Imaging Studies ordered:  I ordered imaging studies which included x-ray and CT.  I independently reviewed & interpreted imaging & am in agreement with radiology impression. Imaging shows: Chest x-ray negative CT angio dissection study: No dissection.  Multifocal pulmonary infiltrate.  If acute may be atypical infection.  If chronic, could be hypersensitivity pneumonitis or smoking-related lung disease.  Mild emphysema.  Cholelithiasis without cholecystitis  Medications  I ordered medication including Zofran for nausea, GI cocktail and Protonix for possible PUD Reevaluation of the patient after medication shows that patient resolved -I reviewed the patient's home medications and did not make adjustments. -I did not prescribe new home medications.  Critical Interventions: Heparin drip,  aspirin  Consultations: I requested consultation with the hospitalist, Jennette Kettle, MD '@0130'$  and discussed lab and imaging findings as well as pertinent plan - they recommend: Heparin drip, consult cardiology and will admit patient. Spoke with Dr. Marcelle Smiling, cardiology, @ 0205 who has no further recommendations at this time for patient NSTEMI other than aspirin and heparin drip.  He will see the patient in the morning.  He will place order for echo.  SDH Polysubstance abuse  ED Course:  61 year old male who presents to the emergency department with epigastric abdominal pain. Physical exam is notable for tenderness to the epigastrium and guarding.  The abdomen is soft and there is bowel  sounds.  Initial labs including CBC and CMP are within normal limits. Lipase 38, doubt pancreatitis at this time. Triage ordered a CK given that he was outside for long period of time prior to his onset of pain which is negative. Given his severe epigastric tenderness there is concern for possible dissection and I ordered this.  The dissection study came back unremarkable.  He does have a gallstone without cholecystitis.  He is recovering from pneumonia which likely explains the multifocal pulmonary infiltrates. There is no intra-abdominal findings to explain his abdominal pain. His initial troponin came back at 23.  On review of his previous troponins these have all been negative.  We will obtain delta. Given GI cocktail and Protonix for possible PUD as well as Zofran.  He does have improvement of symptoms with this. Delta troponin is + 25.  His EKG does not show any signs of ischemia or infarction however there is concern for NSTEMI.  I have discussed this with patient.  He needs admission for ongoing work-up and continued trending troponin.  He verbalized understanding.  Regarding his epigastric abdominal pain think that at this time most likely etiology may be ACS.  Doubt acute hepatobiliary disease.  He  does have cholelithiasis without evidence of cholecystitis.  There is no transaminitis or elevated bilirubin.  Lipase is negative, he does have a history of alcohol use but doubt alcohol induced pancreatitis at this time.  No CT evidence of pancreatitis.  He does have improvement with Protonix and GI cocktail, he may have PUD but we will follow-up on this after cardiac work-up.  Otherwise doubt other intra-abdominal sources of pain.  Not dissecting.  Spoke with hospitalist, Alcario Drought, MD who recommends heparin drip, consult cardiology and will admit patient.  Spoke with cardiology as noted in consultation part of this note who has no further recommendations other than aspirin and heparin drip at this time.  He will place order for echo and the patient will be seen in the morning.  This was relayed to hospitalist.  After consideration of the diagnostic results and the patients response to treatment, I feel that the patent would benefit from admission. The patient has been appropriately medically screened and/or stabilized in the ED.  Final Clinical Impression(s) / ED Diagnoses Final diagnoses:  Epigastric abdominal pain    Rx / DC Orders ED Discharge Orders     None         Mickie Hillier, PA-C 12/17/21 0206    Molpus, Jenny Reichmann, MD 12/17/21 3151

## 2021-12-16 NOTE — ED Notes (Signed)
BP taken in both arms at direction of PA.  Left arm 147/93 Right arm 144/88

## 2021-12-16 NOTE — ED Triage Notes (Addendum)
Pt presents with vomiting x 5 hrs. Epigastric centralized upper gastric pain. He states he was working in yard and had normal day but hit him all of the sudden. Denies hx of pancreatitis. ETOH only occasionally. Think had his appendix removed. He has had some of his colon removed when 16 due to diverticulitis but has not had any other issues. Endorses having little flatulence. Denies diarrhea. Takes suboxine for few months now. Did take it today.

## 2021-12-17 ENCOUNTER — Other Ambulatory Visit: Payer: Self-pay

## 2021-12-17 ENCOUNTER — Other Ambulatory Visit (HOSPITAL_COMMUNITY): Payer: Self-pay

## 2021-12-17 DIAGNOSIS — I200 Unstable angina: Secondary | ICD-10-CM | POA: Diagnosis not present

## 2021-12-17 DIAGNOSIS — I2510 Atherosclerotic heart disease of native coronary artery without angina pectoris: Secondary | ICD-10-CM

## 2021-12-17 DIAGNOSIS — R911 Solitary pulmonary nodule: Secondary | ICD-10-CM

## 2021-12-17 DIAGNOSIS — Z7901 Long term (current) use of anticoagulants: Secondary | ICD-10-CM | POA: Diagnosis not present

## 2021-12-17 DIAGNOSIS — Z955 Presence of coronary angioplasty implant and graft: Secondary | ICD-10-CM | POA: Diagnosis not present

## 2021-12-17 DIAGNOSIS — G8929 Other chronic pain: Secondary | ICD-10-CM | POA: Diagnosis not present

## 2021-12-17 DIAGNOSIS — F1190 Opioid use, unspecified, uncomplicated: Secondary | ICD-10-CM | POA: Diagnosis not present

## 2021-12-17 DIAGNOSIS — R1013 Epigastric pain: Secondary | ICD-10-CM

## 2021-12-17 DIAGNOSIS — Z8673 Personal history of transient ischemic attack (TIA), and cerebral infarction without residual deficits: Secondary | ICD-10-CM | POA: Diagnosis not present

## 2021-12-17 DIAGNOSIS — R112 Nausea with vomiting, unspecified: Secondary | ICD-10-CM

## 2021-12-17 DIAGNOSIS — F17210 Nicotine dependence, cigarettes, uncomplicated: Secondary | ICD-10-CM | POA: Diagnosis not present

## 2021-12-17 DIAGNOSIS — M7918 Myalgia, other site: Secondary | ICD-10-CM | POA: Diagnosis not present

## 2021-12-17 DIAGNOSIS — Z96651 Presence of right artificial knee joint: Secondary | ICD-10-CM | POA: Diagnosis not present

## 2021-12-17 DIAGNOSIS — I5089 Other heart failure: Secondary | ICD-10-CM | POA: Diagnosis not present

## 2021-12-17 DIAGNOSIS — Z20822 Contact with and (suspected) exposure to covid-19: Secondary | ICD-10-CM | POA: Diagnosis not present

## 2021-12-17 DIAGNOSIS — J431 Panlobular emphysema: Secondary | ICD-10-CM

## 2021-12-17 DIAGNOSIS — Z79899 Other long term (current) drug therapy: Secondary | ICD-10-CM | POA: Diagnosis not present

## 2021-12-17 DIAGNOSIS — Z7982 Long term (current) use of aspirin: Secondary | ICD-10-CM | POA: Diagnosis not present

## 2021-12-17 DIAGNOSIS — I110 Hypertensive heart disease with heart failure: Secondary | ICD-10-CM | POA: Diagnosis not present

## 2021-12-17 DIAGNOSIS — Z9861 Coronary angioplasty status: Secondary | ICD-10-CM

## 2021-12-17 DIAGNOSIS — R2991 Unspecified symptoms and signs involving the musculoskeletal system: Secondary | ICD-10-CM | POA: Diagnosis not present

## 2021-12-17 DIAGNOSIS — Z7902 Long term (current) use of antithrombotics/antiplatelets: Secondary | ICD-10-CM | POA: Diagnosis not present

## 2021-12-17 DIAGNOSIS — J449 Chronic obstructive pulmonary disease, unspecified: Secondary | ICD-10-CM | POA: Diagnosis not present

## 2021-12-17 DIAGNOSIS — I25110 Atherosclerotic heart disease of native coronary artery with unstable angina pectoris: Secondary | ICD-10-CM | POA: Diagnosis not present

## 2021-12-17 LAB — RAPID URINE DRUG SCREEN, HOSP PERFORMED
Amphetamines: NOT DETECTED
Barbiturates: NOT DETECTED
Benzodiazepines: NOT DETECTED
Cocaine: NOT DETECTED
Opiates: NOT DETECTED
Tetrahydrocannabinol: NOT DETECTED

## 2021-12-17 LAB — COMPREHENSIVE METABOLIC PANEL
ALT: 15 U/L (ref 0–44)
AST: 17 U/L (ref 15–41)
Albumin: 3.2 g/dL — ABNORMAL LOW (ref 3.5–5.0)
Alkaline Phosphatase: 56 U/L (ref 38–126)
Anion gap: 7 (ref 5–15)
BUN: 13 mg/dL (ref 8–23)
CO2: 27 mmol/L (ref 22–32)
Calcium: 8.6 mg/dL — ABNORMAL LOW (ref 8.9–10.3)
Chloride: 103 mmol/L (ref 98–111)
Creatinine, Ser: 0.85 mg/dL (ref 0.61–1.24)
GFR, Estimated: >60 mL/min (ref >60)
Glucose, Bld: 84 mg/dL (ref 70–99)
Potassium: 3.5 mmol/L (ref 3.5–5.1)
Sodium: 137 mmol/L (ref 135–145)
Total Bilirubin: 0.4 mg/dL (ref 0.3–1.2)
Total Protein: 6.2 g/dL — ABNORMAL LOW (ref 6.5–8.1)

## 2021-12-17 LAB — CBC
HCT: 35.4 % — ABNORMAL LOW (ref 39.0–52.0)
Hemoglobin: 11.4 g/dL — ABNORMAL LOW (ref 13.0–17.0)
MCH: 30.6 pg (ref 26.0–34.0)
MCHC: 32.2 g/dL (ref 30.0–36.0)
MCV: 95.2 fL (ref 80.0–100.0)
Platelets: 241 10*3/uL (ref 150–400)
RBC: 3.72 MIL/uL — ABNORMAL LOW (ref 4.22–5.81)
RDW: 14.3 % (ref 11.5–15.5)
WBC: 7.4 10*3/uL (ref 4.0–10.5)
nRBC: 0 % (ref 0.0–0.2)

## 2021-12-17 LAB — SARS CORONAVIRUS 2 BY RT PCR: SARS Coronavirus 2 by RT PCR: NEGATIVE

## 2021-12-17 LAB — TROPONIN I (HIGH SENSITIVITY): Troponin I (High Sensitivity): 48 ng/L — ABNORMAL HIGH (ref <18)

## 2021-12-17 LAB — HIV ANTIBODY (ROUTINE TESTING W REFLEX): HIV Screen 4th Generation wRfx: NONREACTIVE

## 2021-12-17 LAB — HEPARIN LEVEL (UNFRACTIONATED): Heparin Unfractionated: <0.1 IU/mL — ABNORMAL LOW (ref 0.30–0.70)

## 2021-12-17 SURGERY — ESOPHAGOGASTRODUODENOSCOPY (EGD) WITH PROPOFOL
Anesthesia: Monitor Anesthesia Care

## 2021-12-17 MED ORDER — UMECLIDINIUM BROMIDE 62.5 MCG/ACT IN AEPB
1.0000 | INHALATION_SPRAY | Freq: Every day | RESPIRATORY_TRACT | Status: DC
  Filled 2021-12-17 (×2): qty 7

## 2021-12-17 MED ORDER — FLUTICASONE PROPIONATE 50 MCG/ACT NA SUSP
1.0000 | Freq: Every day | NASAL | Status: DC
  Administered 2021-12-17: 1 via NASAL
  Filled 2021-12-17 (×2): qty 16

## 2021-12-17 MED ORDER — TAMSULOSIN HCL 0.4 MG PO CAPS
0.4000 mg | ORAL_CAPSULE | Freq: Every day | ORAL | Status: DC
  Administered 2021-12-17: 0.4 mg via ORAL
  Filled 2021-12-17: qty 1

## 2021-12-17 MED ORDER — DULOXETINE HCL 60 MG PO CPEP
60.0000 mg | ORAL_CAPSULE | Freq: Every day | ORAL | Status: DC
  Administered 2021-12-17: 60 mg via ORAL
  Filled 2021-12-17: qty 1

## 2021-12-17 MED ORDER — MOMETASONE FURO-FORMOTEROL FUM 100-5 MCG/ACT IN AERO
2.0000 | INHALATION_SPRAY | Freq: Two times a day (BID) | RESPIRATORY_TRACT | Status: DC
  Filled 2021-12-17 (×2): qty 8.8

## 2021-12-17 MED ORDER — AZITHROMYCIN 250 MG PO TABS
250.0000 mg | ORAL_TABLET | Freq: Every day | ORAL | Status: DC
  Administered 2021-12-17: 250 mg via ORAL
  Filled 2021-12-17: qty 1

## 2021-12-17 MED ORDER — HEPARIN (PORCINE) 25000 UT/250ML-% IV SOLN: 1500.0000 [IU]/h | INTRAVENOUS | Status: DC

## 2021-12-17 MED ORDER — LORATADINE 10 MG PO TABS
10.0000 mg | ORAL_TABLET | Freq: Every day | ORAL | Status: DC
  Administered 2021-12-17: 10 mg via ORAL
  Filled 2021-12-17: qty 1

## 2021-12-17 MED ORDER — QUETIAPINE FUMARATE 50 MG PO TABS
200.0000 mg | ORAL_TABLET | Freq: Every day | ORAL | Status: DC
  Administered 2021-12-17: 200 mg via ORAL
  Filled 2021-12-17: qty 2

## 2021-12-17 MED ORDER — PREDNISONE 20 MG PO TABS
20.0000 mg | ORAL_TABLET | Freq: Every day | ORAL | Status: DC
  Administered 2021-12-17: 20 mg via ORAL
  Filled 2021-12-17: qty 1

## 2021-12-17 MED ORDER — ASPIRIN 81 MG PO TBEC: 81.0000 mg | DELAYED_RELEASE_TABLET | Freq: Every day | ORAL | Status: DC

## 2021-12-17 MED ORDER — ALBUTEROL SULFATE HFA 108 (90 BASE) MCG/ACT IN AERS: 2.0000 | INHALATION_SPRAY | Freq: Four times a day (QID) | RESPIRATORY_TRACT | Status: DC | PRN

## 2021-12-17 MED ORDER — ADULT MULTIVITAMIN W/MINERALS CH
1.0000 | ORAL_TABLET | Freq: Every day | ORAL | Status: DC
  Administered 2021-12-17: 1 via ORAL
  Filled 2021-12-17: qty 1

## 2021-12-17 MED ORDER — ONDANSETRON HCL 4 MG/2ML IJ SOLN: 4.0000 mg | Freq: Four times a day (QID) | INTRAMUSCULAR | Status: DC | PRN

## 2021-12-17 MED ORDER — DIVALPROEX SODIUM 250 MG PO DR TAB
250.0000 mg | DELAYED_RELEASE_TABLET | Freq: Every day | ORAL | Status: DC
  Administered 2021-12-17: 250 mg via ORAL
  Filled 2021-12-17: qty 1

## 2021-12-17 MED ORDER — ATORVASTATIN CALCIUM 80 MG PO TABS
80.0000 mg | ORAL_TABLET | Freq: Every day | ORAL | Status: DC
  Administered 2021-12-17: 80 mg via ORAL
  Filled 2021-12-17: qty 1

## 2021-12-17 MED ORDER — AZELASTINE HCL 0.1 % NA SOLN
1.0000 | Freq: Two times a day (BID) | NASAL | Status: DC
  Administered 2021-12-17: 1 via NASAL
  Filled 2021-12-17 (×2): qty 30

## 2021-12-17 MED ORDER — BUDESON-GLYCOPYRROL-FORMOTEROL 160-9-4.8 MCG/ACT IN AERO: 2.0000 | INHALATION_SPRAY | Freq: Two times a day (BID) | RESPIRATORY_TRACT | Status: DC

## 2021-12-17 MED ORDER — ACETAMINOPHEN 325 MG PO TABS: 650.0000 mg | ORAL_TABLET | ORAL | Status: DC | PRN

## 2021-12-17 MED ORDER — HEPARIN BOLUS VIA INFUSION
2000.0000 [IU] | Freq: Once | INTRAVENOUS | Status: AC
Start: 2021-12-17 — End: 2021-12-17
  Administered 2021-12-17: 2000 [IU] via INTRAVENOUS
  Filled 2021-12-17: qty 2000

## 2021-12-17 MED ORDER — CYCLOBENZAPRINE HCL 10 MG PO TABS: 10.0000 mg | ORAL_TABLET | Freq: Every day | ORAL | Status: DC | PRN

## 2021-12-17 MED ORDER — PANTOPRAZOLE SODIUM 40 MG PO TBEC
80.0000 mg | DELAYED_RELEASE_TABLET | Freq: Every day | ORAL | Status: DC
  Administered 2021-12-17: 80 mg via ORAL
  Filled 2021-12-17: qty 2

## 2021-12-17 MED ORDER — ROPINIROLE HCL 0.5 MG PO TABS
2.0000 mg | ORAL_TABLET | Freq: Every day | ORAL | Status: DC
  Administered 2021-12-17: 2 mg via ORAL
  Filled 2021-12-17: qty 2

## 2021-12-17 MED ORDER — HEPARIN BOLUS VIA INFUSION
4000.0000 [IU] | Freq: Once | INTRAVENOUS | Status: AC
  Administered 2021-12-17: 4000 [IU] via INTRAVENOUS
  Filled 2021-12-17: qty 4000

## 2021-12-17 MED ORDER — ASPIRIN 81 MG PO CHEW
324.0000 mg | CHEWABLE_TABLET | Freq: Once | ORAL | Status: AC
  Administered 2021-12-17: 324 mg via ORAL
  Filled 2021-12-17: qty 4

## 2021-12-17 MED ORDER — ALBUTEROL SULFATE (2.5 MG/3ML) 0.083% IN NEBU: 2.5000 mg | INHALATION_SOLUTION | Freq: Four times a day (QID) | RESPIRATORY_TRACT | Status: DC | PRN

## 2021-12-17 MED ORDER — CLOPIDOGREL BISULFATE 75 MG PO TABS
75.0000 mg | ORAL_TABLET | Freq: Every day | ORAL | Status: DC
  Administered 2021-12-17: 75 mg via ORAL
  Filled 2021-12-17: qty 1

## 2021-12-17 MED ORDER — PREDNISONE 10 MG PO TABS: 10.0000 mg | ORAL_TABLET | Freq: Every day | ORAL | Status: DC

## 2021-12-17 MED ORDER — BUPRENORPHINE HCL-NALOXONE HCL 8-2 MG SL SUBL
1.0000 | SUBLINGUAL_TABLET | Freq: Two times a day (BID) | SUBLINGUAL | Status: DC
  Administered 2021-12-17: 1 via SUBLINGUAL
  Filled 2021-12-17: qty 1

## 2021-12-17 MED ORDER — HEPARIN (PORCINE) 25000 UT/250ML-% IV SOLN
1200.0000 [IU]/h | INTRAVENOUS | Status: DC
  Administered 2021-12-17: 1200 [IU]/h via INTRAVENOUS
  Filled 2021-12-17: qty 250

## 2021-12-17 NOTE — ED Notes (Signed)
Pt requested something to drink and some crackers. I gave pt some ginger ale and advised to see how he does before we give him anything solid. Provider approved liquids

## 2021-12-17 NOTE — Assessment & Plan Note (Addendum)
CAD s/p LAD stent, had ISR treated with balloon angioplasty in 2021 See ACS above. 1. On a more chronic note, im not sure why this patient is only on plavix and not DAPT given a history of ISR? 1. Regardless, putting on DAPT until ACS ruled out 2. And will defer to cards if he should be on long term DAPT.

## 2021-12-17 NOTE — Assessment & Plan Note (Addendum)
Pt may or may not have ACS. H/o CAD s/p Stent, ISR s/p re-angioplasty.  N/V and rising trops today. Though symptoms today are a bit atypical: mostly epigastric abd pain and TTP, not CP. No EKG changes. CT AP, lipase, and remainder of work up negative for other obvious possible cause of symptoms. N/V and abd pain symptoms all resolved for the moment. Dont have good alternative explanation for the troponin bump though. 1. ACS pathway 2. Start heparin gtt for the moment 3. ASA 325 in ED then 81 daily 4. Cont home plavix 5. Tele monitor 6. Admitting pt over to [facility] 7. EDP calling cards for consult 8. CT CAP = 1. No dissection 2. No ischemia 3. Has multifocal patchy infiltrate (probably partially treated atypical PNA, see COPD below). 4. No other obvious cause for epigastric pain. 9. LFTs nl (checking repeat CMP this AM) 10. Lipase neg 11. Check UDS given h/o cocaine abuse in past

## 2021-12-17 NOTE — Care Management (Signed)
  Transition of Care [facility]) Screening Note   Patient Details  Name: Keith Gibbs Date of Birth: 1960/11/20   [facility]) CM/SW Contact:    Bethena Roys, RN Phone Number: 12/17/2021, 11:41 AM    Transition of Care Department [facility]) has reviewed the patient and no TOC needs have been identified at this time. We will continue to monitor patient advancement through interdisciplinary progression rounds. If new patient transition needs arise, please place a TOC consult.

## 2021-12-17 NOTE — Telephone Encounter (Signed)
Patient Advocate Encounter  Prior Authorization for BREZTRI AREOSPHERE [id number] MCG INHALER  has been approved.    [facility] TRACKS PA# [id number] Effective dates: 12/15/2021 through 12/15/2022  Patients co-pay is $4.

## 2021-12-17 NOTE — ED Notes (Signed)
ED TO INPATIENT HANDOFF REPORT  ED Nurse Name and Phone #:   Burnis Medin RN Meiko Ives EMT-P [place] EMT-P   S Name/Age/Gender Keith Gibbs 61 y.o. male Room/Bed: WA07/WA07  Code Status   Code Status: Full Code  Home/SNF/Other Home Patient oriented to: self, place, time, and situation Is this baseline? Yes   Triage Complete: Triage complete  Chief Complaint Unstable angina ([place]) [I20.0]  Triage Note Pt presents with vomiting x 5 hrs. Epigastric centralized upper gastric pain. He states he was working in yard and had normal day but hit him all of the sudden. Denies hx of pancreatitis. ETOH only occasionally. Think had his appendix removed. He has had some of his colon removed when 16 due to diverticulitis but has not had any other issues. Endorses having little flatulence. Denies diarrhea. Takes suboxine for few months now. Did take it today.    Allergies Allergies  Allergen Reactions   Latuda [Lurasidone Hcl] Other (See Comments)    tremor    Saphris [Asenapine] Other (See Comments)    Increased tremors     Level of Care/Admitting Diagnosis ED Disposition     ED Disposition  Admit   Condition  --   Comment  Hospital Area: [place] [[id number]]  Level of Care: Progressive [102]  Admit to Progressive based on following criteria: CARDIOVASCULAR & THORACIC of moderate stability with acute coronary syndrome symptoms/low risk myocardial infarction/hypertensive urgency/arrhythmias/heart failure potentially compromising stability and stable post cardiovascular intervention patients.  May place patient in observation at [facility] or [place] if equivalent level of care is available:: No  Covid Evaluation: Asymptomatic - no recent exposure (last 10 days) testing not required  Diagnosis: Unstable angina [facility]) [[id number]]  Admitting Physician: Etta Quill [[id number]]  Attending Physician: Etta Quill [phone]           B Medical/Surgery History Past Medical History:  Diagnosis Date   Anginal pain ([place])    Anxiety    Arthritis    Bipolar 1 disorder ([place])    Bursitis    CAD (coronary artery disease)    Chronic pain    COPD (chronic obstructive pulmonary disease) ([place])    Current use of long term anticoagulation    DAPT (ASA + clopidogrel)   Depression    Diverticulitis    Dyspnea    GERD (gastroesophageal reflux disease)    Grade I diastolic dysfunction    Hepatitis C 2012   No longer has Hep C   HLD (hyperlipidemia)    Hypertension    MI (myocardial infarction) ([place])    Polysubstance abuse ([place])    cocaine, marijuana, ETOH   PUD (peptic ulcer disease)    S/P angioplasty with stent 06/10/2016   a.) 90% stenosis of pLAD to mLAD - 2.5 x 18 mm Xience Alpine (DES x 1) placed to pLAD   S/P PTCA (percutaneous transluminal coronary angioplasty) 12/04/2019   a.) 60% in stent restenosis of DES to pLAD; LVEF 65%.   Schizophrenia ([place])    Stroke [facility])    Valvular insufficiency    a.) Mild MR, TR, PR; mild to moderate AR on 03/05/2018 TTE   Past Surgical History:  Procedure Laterality Date   ABDOMINAL SURGERY     removed small piece of intestines due to [facility] Diverticulosis   APPENDECTOMY     BACK SURGERY     CARDIAC CATHETERIZATION Left 06/10/2016   Procedure: Left Heart Cath and Coronary Angiography;  Surgeon:  Dionisio David, MD;  Location: [place] CV LAB;  Service: Cardiovascular;  Laterality: Left;   CARDIAC CATHETERIZATION N/A 06/10/2016   Procedure: Coronary Stent Intervention;  Surgeon: Yolonda Kida, MD;  Location: [place] CV LAB;  Service: Cardiovascular;  Laterality: N/A;   COLON SURGERY     COLONOSCOPY     COLONOSCOPY WITH PROPOFOL N/A 01/05/2017   Procedure: COLONOSCOPY WITH PROPOFOL;  Surgeon: Jonathon Bellows, MD;  Location: [facility] ENDOSCOPY;  Service: Endoscopy;  Laterality: N/A;   COLONOSCOPY WITH PROPOFOL N/A 02/13/2020   Procedure: COLONOSCOPY WITH PROPOFOL;   Surgeon: Virgel Manifold, MD;  Location: [facility] ENDOSCOPY;  Service: Endoscopy;  Laterality: N/A;   CORONARY ANGIOPLASTY WITH STENT PLACEMENT     ESOPHAGOGASTRODUODENOSCOPY (EGD) WITH PROPOFOL N/A 01/05/2017   Procedure: ESOPHAGOGASTRODUODENOSCOPY (EGD) WITH PROPOFOL;  Surgeon: Jonathon Bellows, MD;  Location: [facility] ENDOSCOPY;  Service: Endoscopy;  Laterality: N/A;   ESOPHAGOGASTRODUODENOSCOPY (EGD) WITH PROPOFOL N/A 02/13/2020   Procedure: ESOPHAGOGASTRODUODENOSCOPY (EGD) WITH PROPOFOL;  Surgeon: Virgel Manifold, MD;  Location: [facility] ENDOSCOPY;  Service: Endoscopy;  Laterality: N/A;   INTRAVASCULAR PRESSURE WIRE/FFR STUDY N/A 12/04/2019   Procedure: INTRAVASCULAR PRESSURE WIRE/FFR STUDY;  Surgeon: Sherren Mocha, MD;  Location: [place] CV LAB;  Service: Cardiovascular;  Laterality: N/A;   KNEE ARTHROSCOPY WITH MEDIAL MENISECTOMY Right 09/05/2017   Procedure: KNEE ARTHROSCOPY WITH MEDIAL AND LATERAL  MENISECTOMY PARTIAL SYNOVECTOMY;  Surgeon: Hessie Knows, MD;  Location: [facility] ORS;  Service: Orthopedics;  Laterality: Right;   LEFT HEART CATH AND CORONARY ANGIOGRAPHY N/A 12/04/2019   Procedure: LEFT HEART CATH AND CORONARY ANGIOGRAPHY;  Surgeon: Sherren Mocha, MD;  Location: [place] CV LAB;  Service: Cardiovascular;  Laterality: N/A;   SHOULDER SURGERY Right 04/09/2012   SPINE SURGERY     TOTAL KNEE ARTHROPLASTY Right 01/19/2021   Procedure: TOTAL KNEE ARTHROPLASTY - Rachelle Hora to Assist;  Surgeon: Hessie Knows, MD;  Location: [facility] ORS;  Service: Orthopedics;  Laterality: Right;     A IV Location/Drains/Wounds Patient Lines/Drains/Airways Status     Active Line/Drains/Airways     Name Placement date Placement time Site Days   Peripheral IV 12/17/21 20 G Right Antecubital 12/17/21  0121  Antecubital  less than 1   Peripheral IV 12/17/21 20 G Left Antecubital 12/17/21  0215  Antecubital  less than 1   Negative Pressure Wound Therapy Knee Anterior;Right 01/19/21  --  --  332    Incision (Closed) 01/19/21 Knee Right 01/19/21  1057  -- 332            Intake/Output Last 24 hours No intake or output data in the 24 hours ending 12/17/21 0229  Labs/Imaging Results for orders placed or performed during the hospital encounter of 12/16/21 (from the past 48 hour(s))  Lipase, blood     Status: None   Collection Time: 12/16/21  9:36 PM  Result Value Ref Range   Lipase 38 11 - 51 U/L    Comment: Performed at [facility], [place] [address], [place], [place] [address]  Comprehensive metabolic panel     Status: Abnormal   Collection Time: 12/16/21  9:36 PM  Result Value Ref Range   Sodium 138 135 - 145 mmol/L   Potassium 3.4 (L) 3.5 - 5.1 mmol/L   Chloride 104 98 - 111 mmol/L   CO2 25 22 - 32 mmol/L   Glucose, Bld 107 (H) 70 - 99 mg/dL    Comment: Glucose reference range applies only to samples taken after fasting  for at least 8 hours.   BUN 18 8 - 23 mg/dL   Creatinine, Ser 0.77 0.61 - 1.24 mg/dL   Calcium 8.8 (L) 8.9 - 10.3 mg/dL   Total Protein 8.1 6.5 - 8.1 g/dL   Albumin 4.3 3.5 - 5.0 g/dL   AST 19 15 - 41 U/L   ALT 16 0 - 44 U/L   Alkaline Phosphatase 68 38 - 126 U/L   Total Bilirubin 0.5 0.3 - 1.2 mg/dL   GFR, Estimated >60 >60 mL/min    Comment: (NOTE) Calculated using the CKD-EPI Creatinine Equation (2021)    Anion gap 9 5 - 15    Comment: Performed at [facility], [place] [address], [place], [place] [address]  CBC with Differential     Status: Abnormal   Collection Time: 12/16/21  9:36 PM  Result Value Ref Range   WBC 11.7 (H) 4.0 - 10.5 K/uL   RBC 4.07 (L) 4.22 - 5.81 MIL/uL   Hemoglobin 13.0 13.0 - 17.0 g/dL   HCT 37.6 (L) 39.0 - 52.0 %   MCV 92.4 80.0 - 100.0 fL   MCH 31.9 26.0 - 34.0 pg   MCHC 34.6 30.0 - 36.0 g/dL   RDW 14.1 11.5 - 15.5 %   Platelets 293 150 - 400 K/uL   nRBC 0.0 0.0 - 0.2 %   Neutrophils Relative % 70 %   Neutro Abs 8.2 (H) 1.7 - 7.7 K/uL   Lymphocytes Relative 23 %   Lymphs Abs 2.6  0.7 - 4.0 K/uL   Monocytes Relative 6 %   Monocytes Absolute 0.7 0.1 - 1.0 K/uL   Eosinophils Relative 1 %   Eosinophils Absolute 0.1 0.0 - 0.5 K/uL   Basophils Relative 0 %   Basophils Absolute 0.0 0.0 - 0.1 K/uL   Immature Granulocytes 0 %   Abs Immature Granulocytes 0.04 0.00 - 0.07 K/uL    Comment: Performed at [facility], [place] [address] [place], [place] [address]  CK     Status: None   Collection Time: 12/16/21  9:36 PM  Result Value Ref Range   Total CK 99 49 - 397 U/L    Comment: Performed at [facility], [place] [address], [place], [place] [address]  Troponin I (High Sensitivity)     Status: Abnormal   Collection Time: 12/16/21  9:36 PM  Result Value Ref Range   Troponin I (High Sensitivity) 23 (H) <18 ng/L    Comment: (NOTE) Elevated high sensitivity troponin I (hsTnI) values and significant  changes across serial measurements may suggest ACS but many other  chronic and acute conditions are known to elevate hsTnI results.  Refer to the "Links" section for chest pain algorithms and additional  guidance. Performed at [facility], [place] [address] [place], [place] [address]   Urinalysis, Routine w reflex microscopic Urine, Clean Catch     Status: Abnormal   Collection Time: 12/16/21  9:50 PM  Result Value Ref Range   Color, Urine YELLOW YELLOW   APPearance CLEAR CLEAR   Specific Gravity, Urine 1.015 1.005 - 1.030   pH 7.0 5.0 - 8.0   Glucose, UA NEGATIVE NEGATIVE mg/dL   Hgb urine dipstick SMALL (A) NEGATIVE   Bilirubin Urine NEGATIVE NEGATIVE   Ketones, ur NEGATIVE NEGATIVE mg/dL   Protein, ur NEGATIVE NEGATIVE mg/dL   Nitrite NEGATIVE NEGATIVE   Leukocytes,Ua NEGATIVE NEGATIVE   RBC / HPF 6-10 0 - 5 RBC/hpf  WBC, UA 0-5 0 - 5 WBC/hpf   Bacteria, UA NONE SEEN NONE SEEN   Mucus PRESENT    Amorphous Crystal PRESENT     Comment: Performed at [facility], [place] [address], [place],  [place] [address]  Troponin I (High Sensitivity)     Status: Abnormal   Collection Time: 12/17/21 12:11 AM  Result Value Ref Range   Troponin I (High Sensitivity) 48 (H) <18 ng/L    Comment: DELTA CHECK NOTED (NOTE) Elevated high sensitivity troponin I (hsTnI) values and significant  changes across serial measurements may suggest ACS but many other  chronic and acute conditions are known to elevate hsTnI results.  Refer to the Links section for chest pain algorithms and additional  guidance. Performed at [facility], [place] [address] [place], [place] [address]    DG Chest Portable 1 View  Result Date: 12/16/2021 CLINICAL DATA:  Epigastric pain. EXAM: PORTABLE CHEST 1 VIEW COMPARISON:  Chest x-ray 11/12/2021 FINDINGS: The heart size and mediastinal contours are within normal limits. Both lungs are clear. The visualized skeletal structures are unremarkable. IMPRESSION: No active disease. Electronically Signed   By: Ronney Asters M.D.   On: 12/16/2021 21:46   CT Angio Chest/Abd/Pel for Dissection W and/or Wo Contrast  Result Date: 12/16/2021 CLINICAL DATA:  Acute aortic syndrome (AAS) suspected. Vomiting, upper abdominal pain EXAM: CT ANGIOGRAPHY CHEST, ABDOMEN AND PELVIS TECHNIQUE: Non-contrast CT of the chest was initially obtained. Multidetector CT imaging through the chest, abdomen and pelvis was performed using the standard protocol during bolus administration of intravenous contrast. Multiplanar reconstructed images and MIPs were obtained and reviewed to evaluate the vascular anatomy. RADIATION DOSE REDUCTION: This exam was performed according to the departmental dose-optimization program which includes automated exposure control, adjustment of the mA and/or kV according to patient size and/or use of iterative reconstruction technique. CONTRAST:  173m OMNIPAQUE IOHEXOL 350 MG/ML SOLN COMPARISON:  CT abdomen pelvis 11/23/2018 FINDINGS: CTA CHEST FINDINGS Cardiovascular: Mild  coronary artery calcification. Global cardiac size within normal limits. No pericardial effusion. Central pulmonary arteries are of normal caliber. The thoracic aorta is normal in course and caliber. No intramural hematoma, dissection, or aneurysm. Arch vasculature demonstrates classic anatomic configuration and is widely patent proximally. Mediastinum/Nodes: There is shotty bilateral hilar, prevascular, and right paratracheal lymphadenopathy, possibly reactive in nature. No frankly pathologic thoracic adenopathy. Visualized thyroid is unremarkable. Esophagus is unremarkable. Lungs/Pleura: Moderate emphysema. Superimposed diffuse multifocal ground-glass pulmonary infiltrate is present demonstrating an upper lobe predominance, likely infectious or inflammatory in the acute setting. If chronic, this may reflect changes of hypersensitivity pneumonitis or smoking related lung disease. There is mild bronchial wall thickening in keeping with airway inflammation. There is a more focal 10 mm spiculated opacity within the right upper lobe at axial image # 53/5, which may represent more focal airspace infiltrate on a underlying pulmonary nodule. No pneumothorax or pleural effusion. No central obstructing mass. Musculoskeletal: No acute bone abnormality. No lytic or blastic bone lesion. Review of the MIP images confirms the above findings. CTA ABDOMEN AND PELVIS FINDINGS VASCULAR Aorta: Normal caliber aorta without aneurysm, dissection, vasculitis or significant stenosis. Mild atherosclerotic calcification Celiac: Patent without evidence of aneurysm, dissection, vasculitis or significant stenosis. SMA: Less than 50% stenosis of the superior mesenteric artery proximally. Distally, the vessel is widely patent. No aneurysm or dissection. Renals: Single left and dual right renal arteries are widely patent and demonstrate normal vascular morphology. No aneurysm or dissection. IMA: Patent without evidence of  aneurysm, dissection,  vasculitis or significant stenosis. Inflow: Patent without evidence of aneurysm, dissection, vasculitis or significant stenosis. Veins: No obvious venous abnormality within the limitations of this arterial phase study. Review of the MIP images confirms the above findings. NON-VASCULAR Hepatobiliary: The gallbladder is distended. A a dependently layering stone is seen within the gallbladder lumen. A tiny calcified stone is seen within the origin of the cystic duct, best seen on axial image # 122/6. No pericholecystic inflammatory changes identified. 8 mm simple cyst within the right hepatic lobe. Liver otherwise unremarkable. No intra or extrahepatic biliary ductal dilation Pancreas: Unremarkable. No pancreatic ductal dilatation or surrounding inflammatory changes. Spleen: Unremarkable Adrenals/Urinary Tract: Adrenal glands are unremarkable. Kidneys are normal, without renal calculi, focal lesion, or hydronephrosis. Bladder is unremarkable. Stomach/Bowel: Stomach, small bowel, and large bowel are unremarkable. Appendix is not clearly identified and is likely absent. No free intraperitoneal gas fluid. Lymphatic: No pathologic adenopathy within the abdomen and pelvis. Reproductive: Prostate is unremarkable. Other: No abdominal wall hernia. Musculoskeletal: Postsurgical changes of L4-S1 lumbar fusion with instrumentation and right L5 hemilaminectomy are identified. No acute bone abnormality. Review of the MIP images confirms the above findings. IMPRESSION: No evidence of thoracoabdominal aortic aneurysm or dissection. Mild coronary artery calcification. Multifocal pulmonary infiltrate. If acute, this may reflect changes of atypical infection. If chronic, additional considerations could include hypersensitivity pneumonitis or smoking related lung disease. Mild emphysema. Slightly spiculated opacity within the right upper lobe measuring 10 mm. Short-term follow-up imaging in 3 months is recommended to document stability  or resolution and confirm or exclude the presence of an underlying focal pulmonary nodule. Cholelithiasis. Gallbladder distension with tiny calcified gallstone seen at the origin of the cystic duct. Correlation with liver enzymes is recommended to exclude early changes of acute calculus cholecystitis. Aortic Atherosclerosis (ICD10-I70.0) and Emphysema (ICD10-J43.9). Electronically Signed   By: Fidela Salisbury M.D.   On: 12/16/2021 23:12    Pending Labs Unresulted Labs (From admission, onward)     Start     Ordered   12/17/21 0830  Heparin level (unfractionated)  Once-Timed,   TIMED        12/17/21 0218   12/17/21 0500  Lipoprotein A (LPA)  Tomorrow morning,   R        12/17/21 0142   12/17/21 0500  CBC  Daily,   R      12/17/21 0147   12/17/21 0500  Comprehensive metabolic panel  Tomorrow morning,   R        12/17/21 0153   12/17/21 0148  SARS Coronavirus 2 by RT PCR (hospital order, performed in [place] hospital lab) *cepheid single result test* Anterior Nasal Swab  (Tier 2 - Symptomatic/Asymptomatic)  Once,   R        12/17/21 0147   12/17/21 0134  HIV Antibody (routine testing w rflx)  (HIV Antibody (Routine testing w reflex) panel)  Once,   R        12/17/21 0142   12/17/21 0134  Rapid urine drug screen (hospital performed)  ONCE - STAT,   STAT        12/17/21 0142            Vitals/Pain Today's Vitals   12/17/21 0100 12/17/21 0130 12/17/21 0218 12/17/21 0220  BP: 113/66 104/68  115/69  Pulse: (!) 50 (!) 50  (!) 53  Resp: '18 18  14  '$ Temp:      TempSrc:      SpO2: 97% 92%  95%  Weight:      Height:      PainSc:   0-No pain     Isolation Precautions Airborne and Contact precautions  Medications Medications  buprenorphine-naloxone (SUBOXONE) 8-2 mg per SL tablet 1 tablet (has no administration in time range)  atorvastatin (LIPITOR) tablet 80 mg (has no administration in time range)  clopidogrel (PLAVIX) tablet 75 mg (has no administration in time range)   QUEtiapine (SEROQUEL) tablet 200 mg (200 mg Oral Given 12/17/21 0140)  rOPINIRole (REQUIP) tablet 2 mg (2 mg Oral Given 12/17/21 0140)  tamsulosin (FLOMAX) capsule 0.4 mg (has no administration in time range)  pantoprazole (PROTONIX) EC tablet 80 mg (has no administration in time range)  multivitamin with minerals tablet 1 tablet (has no administration in time range)  DULoxetine (CYMBALTA) DR capsule 60 mg (has no administration in time range)  divalproex (DEPAKOTE) DR tablet 250 mg (250 mg Oral Given 12/17/21 0140)  cyclobenzaprine (FLEXERIL) tablet 10 mg (has no administration in time range)  azithromycin (ZITHROMAX) tablet 250 mg (has no administration in time range)  fluticasone (FLONASE) 50 MCG/ACT nasal spray 1 spray (has no administration in time range)  loratadine (CLARITIN) tablet 10 mg (has no administration in time range)  azelastine (ASTELIN) 0.1 % nasal spray 1 spray (has no administration in time range)  predniSONE (DELTASONE) tablet 20 mg (has no administration in time range)  predniSONE (DELTASONE) tablet 10 mg (has no administration in time range)  albuterol (PROVENTIL) (2.5 MG/3ML) 0.083% nebulizer solution 2.5 mg (has no administration in time range)  umeclidinium bromide (INCRUSE ELLIPTA) 62.5 MCG/ACT 1 puff (has no administration in time range)    And  mometasone-formoterol (DULERA) 100-5 MCG/ACT inhaler 2 puff (has no administration in time range)  aspirin EC tablet 81 mg (has no administration in time range)  acetaminophen (TYLENOL) tablet 650 mg (has no administration in time range)  ondansetron (ZOFRAN) injection 4 mg (has no administration in time range)  heparin ADULT infusion 100 units/mL (25000 units/239m) (1,200 Units/hr Intravenous New Bag/Given 12/17/21 0213)  ondansetron (ZOFRAN) injection 4 mg (4 mg Intravenous Given 12/16/21 2220)  iohexol (OMNIPAQUE) 350 MG/ML injection 100 mL (100 mLs Intravenous Contrast Given 12/16/21 2239)  alum & mag hydroxide-simeth  (MAALOX/MYLANTA) 200-200-20 MG/5ML suspension 30 mL (30 mLs Oral Given 12/16/21 2338)  pantoprazole (PROTONIX) injection 40 mg (40 mg Intravenous Given 12/16/21 2338)  aspirin chewable tablet 324 mg (324 mg Oral Given 12/17/21 0141)  heparin bolus via infusion 4,000 Units (4,000 Units Intravenous Bolus from Bag 12/17/21 0214)    Mobility walks Low fall risk   Focused Assessments Cardiac Assessment Handoff:    Lab Results  Component Value Date   CKTOTAL 99 12/16/2021   TROPONINI <0.03 02/20/2017   No results found for: DDIMER Does the Patient currently have chest pain? No    R Recommendations: See Admitting Provider Note  Report given to:   Additional Notes:  [facility]

## 2021-12-17 NOTE — Plan of Care (Signed)
  Problem: Education: Goal: Knowledge of General Education information will improve Description: Including pain rating scale, medication(s)/side effects and non-pharmacologic comfort measures Outcome: Progressing   Problem: Health Behavior/Discharge Planning: Goal: Ability to manage health-related needs will improve Outcome: Progressing   Problem: Skin Integrity: Goal: Risk for impaired skin integrity will decrease Outcome: Progressing   

## 2021-12-17 NOTE — Progress Notes (Addendum)
Went to see patient for consultation for nausea and vomiting. Patient developed acute nausea / vomiting and upper abdominal pain yesterday. Emesis non-bloody. No blood in stool. WBC was 11.7, .lipase 38, liver chemistries normal.    US shows cholelithiasis and suggestion of cirrhosis. No liver masses  CTA chest / abd /pelvis shows the gallbladder is distended. A dependently layering stone is seen within the gallbladder lumen. A tiny calcified stone is seen within the origin of the cystic duct, best seen on axial image # 122/6. No pericholecystic inflammatory changes identified. 8 mm simple cyst within the right hepatic lobe. Liver otherwise unremarkable. No intra or extrahepatic biliary ductal dilation  In talking with patient it was discovered that he takes meloxicam, diclofenac and daily baby asa.   We discussed having an EGD in am and if negative then a HIDA. Patient expressed that he wanted to go home and did not want any testing done in the hospital. He has a GI in [place] and plans to follow up with her.   We did recommend he not take mobic or diclofenac until he could see his GI in [place]. He is on a daily PPI, would recommend increasing that to BID for time being.     Attending physician's note  I have taken a history, reviewed the chart and examined the patient. I performed a substantive portion of this encounter, including complete performance of at least one of the key components, in conjunction with the APP. I agree with the APP's note, impression and recommendations.    61 year old male with history of substance abuse on Suboxone presented to ER with acute onset epigastric abdominal pain associated with nausea and vomiting. He has cholelithiasis with distended gallbladder and also ?  Cirrhotic appearing liver parenchyma  Discussed with patient about possible EGD to exclude erosive gastritis or gastroduodenal ulcers given his acute symptoms, patient does not want to stay  hospitalized given his symptoms have improved.  He wants to follow-up with his outpatient GI in [place]  If EGD unremarkable, will need to consider HIDA scan to exclude cholecystitis  Follow-up with outpatient GI  I have spent >60 minutes of patient care (this includes precharting, chart review, review of results, face-to-face time used for counseling as well as treatment plan and follow-up. The patient was provided an opportunity to ask questions and all were answered. The patient agreed with the plan and demonstrated an understanding of the instructions.  Damaris Hippo , MD [phone]

## 2021-12-17 NOTE — Care Plan (Addendum)
This 61 years old male with PMH significant for prior polysubstance abuse, HCV status posttreatment, chronic opioid dependence on Suboxone, hypertension, history of CAD 90% LAD stenosis status post DES to LAD in 2017 presented in the ED with epigastric pain, nausea and vomiting.  Patient recently had COPD exacerbation and was treated outpatient by PCP with steroids and Zithromax.  He reports improvement in respiratory symptoms.  Patient is found to have trending up troponins.  Patient was admitted for atypical chest pain.  Cardiology was consulted,  states seemed related to the GI etiology, No plan for any cardiac work-up.  Cardiology signed off.  Patient was seen and examined, reports feeling better.

## 2021-12-17 NOTE — Progress Notes (Addendum)
[place] for Heparin  Indication: chest pain/ACS  Allergies  Allergen Reactions   Latuda [Lurasidone Hcl] Other (See Comments)    tremor    Saphris [Asenapine] Other (See Comments)    Increased tremors     Patient Measurements: Height: '6\' 3"'$  (190.5 cm) Weight: 95.2 kg (209 lb 14.4 oz) IBW/kg (Calculated) : 84.5 Heparin Dosing Weight: TBW  Vital Signs: Temp: 97.7 F (36.5 C) (05/26 0755) Temp Source: Oral (05/26 0755) BP: 107/62 (05/26 0755) Pulse Rate: 44 (05/26 0755)  Labs: Recent Labs    12/16/21 2136 12/17/21 0011 12/17/21 0955  HGB 13.0  --  11.4*  HCT 37.6*  --  35.4*  PLT 293  --  241  HEPARINUNFRC  --   --  <0.10*  CREATININE 0.77  --  0.85  CKTOTAL 99  --   --   TROPONINIHS 23* 48*  --      Estimated Creatinine Clearance: 109.1 mL/min (by C-G formula based on SCr of 0.85 mg/dL).   Medical History: Past Medical History:  Diagnosis Date   Anginal pain ([place])    Anxiety    Arthritis    Bipolar 1 disorder (HCC)    Bursitis    CAD (coronary artery disease)    Chronic pain    COPD (chronic obstructive pulmonary disease) (HCC)    Current use of long term anticoagulation    DAPT (ASA + clopidogrel)   Depression    Diverticulitis    Dyspnea    GERD (gastroesophageal reflux disease)    Grade I diastolic dysfunction    Hepatitis C 2012   No longer has Hep C   HLD (hyperlipidemia)    Hypertension    MI (myocardial infarction) ([place])    Polysubstance abuse (HCC)    cocaine, marijuana, ETOH   PUD (peptic ulcer disease)    S/P angioplasty with stent 06/10/2016   a.) 90% stenosis of pLAD to mLAD - 2.5 x 18 mm Xience Alpine (DES x 1) placed to pLAD   S/P PTCA (percutaneous transluminal coronary angioplasty) 12/04/2019   a.) 60% in stent restenosis of DES to pLAD; LVEF 65%.   Schizophrenia ([place])    Stroke [facility])    Valvular insufficiency    a.) Mild MR, TR, PR; mild to moderate AR on 03/05/2018 TTE     Medications:  Infusions:   heparin 1,200 Units/hr (12/17/21 0700)    Assessment: 1 yoM who presenting with abdominal pain.   EKG concerning for NSTEMI, troponin trending up.  Not on anticoagulation PTA. Pharmacy dosing heparin  -heparin level undetectable on 1200 units/hr  Goal of Therapy:  Heparin level 0.3-0.7 units/ml Monitor platelets by anticoagulation protocol: Yes   Plan:  Heparin 2000 units IV bolus x1 and increase to 1500 units/hr Heparin level in 6 hours and daily wth CBC daily  Hildred Laser, PharmD Clinical Pharmacist **Pharmacist phone directory can now be found on [email or web address] ([email or web address]).  Listed under [place].

## 2021-12-17 NOTE — Progress Notes (Signed)
ANTICOAGULATION CONSULT NOTE - Initial Consult  Pharmacy Consult for Heparin  Indication: chest pain/ACS  Allergies  Allergen Reactions   Latuda [Lurasidone Hcl] Other (See Comments)    tremor    Saphris [Asenapine] Other (See Comments)    Increased tremors     Patient Measurements: Height: '6\' 3"'$  (190.5 cm) Weight: 99.8 kg (220 lb) IBW/kg (Calculated) : 84.5 Heparin Dosing Weight: TBW  Vital Signs: Temp: 97.5 F (36.4 C) (05/25 2053) Temp Source: Oral (05/25 2053) BP: 113/66 (05/26 0100) Pulse Rate: 50 (05/26 0100)  Labs: Recent Labs    12/16/21 2136 12/17/21 0011  HGB 13.0  --   HCT 37.6*  --   PLT 293  --   CREATININE 0.77  --   CKTOTAL 99  --   TROPONINIHS 23* 48*    Estimated Creatinine Clearance: 115.9 mL/min (by C-G formula based on SCr of 0.77 mg/dL).   Medical History: Past Medical History:  Diagnosis Date   Anginal pain ([place])    Anxiety    Arthritis    Bipolar 1 disorder (HCC)    Bursitis    CAD (coronary artery disease)    Chronic pain    COPD (chronic obstructive pulmonary disease) (HCC)    Current use of long term anticoagulation    DAPT (ASA + clopidogrel)   Depression    Diverticulitis    Dyspnea    GERD (gastroesophageal reflux disease)    Grade I diastolic dysfunction    Hepatitis C 2012   No longer has Hep C   HLD (hyperlipidemia)    Hypertension    MI (myocardial infarction) ([place])    Polysubstance abuse (HCC)    cocaine, marijuana, ETOH   PUD (peptic ulcer disease)    S/P angioplasty with stent 06/10/2016   a.) 90% stenosis of pLAD to mLAD - 2.5 x 18 mm Xience Alpine (DES x 1) placed to pLAD   S/P PTCA (percutaneous transluminal coronary angioplasty) 12/04/2019   a.) 60% in stent restenosis of DES to pLAD; LVEF 65%.   Schizophrenia ([place])    Stroke [facility])    Valvular insufficiency    a.) Mild MR, TR, PR; mild to moderate AR on 03/05/2018 TTE    Medications:  Infusions:   Assessment: 75 yoM who presenting with  abdominal pain.   EKG concerning for NSTEMI, troponin trending up.  Not on anticoagulation PTA. Baseline CBC, Scr WNL.   Goal of Therapy:  Heparin level 0.3-0.7 units/ml Monitor platelets by anticoagulation protocol: Yes   Plan:  Heparin 4000 units IV bolus x1 Heparin infusion at 1200 units/hr Check heparin level 6h after infusion starts Daily heparin level & CBC while on heparin  Netta Cedars PharmD 12/17/2021,1:39 AM

## 2021-12-17 NOTE — H&P (Signed)
History and Physical    Patient: KHRIS JANSSON KNL:[id number] DOB: March 22, 1961 DOA: 12/16/2021 DOS: the patient was seen and examined on 12/17/2021 PCP: Gildardo Pounds, NP  Patient coming from: Home  Chief Complaint:  Chief Complaint  Patient presents with   Abdominal Pain   HPI: ARASH KARSTENS is a 61 y.o. male with medical history significant of prior polysubstance abuse, HCV s/p treatment, chronic opiate dependence on suboxone, HTN.  Pt with h/o CAD 90% LAD stenosis s/p DES to LAD in 2017.  Pt had 60% ISR on repeat LHC in 11/2019 s/p balloon angioplasty.  Patient recently had SOB / COPD exacerbation, treated as outpt by PCP and then Dr. Melvyn Novas on 5/23.  Started on steroid taper + azithromycin.  SOB and respiratory symptoms have signficantly improved since starting treatment per pt.  He has no c/o SOB, today.  Today he presents to the ED with sudden onset epgiastric pain, N/V x multiple episodes.  No hematemesis.  No CP either.  Symptoms were severe, now resolved completely in ED.   Review of Systems: As mentioned in the history of present illness. All other systems reviewed and are negative. Past Medical History:  Diagnosis Date   Anginal pain ([place])    Anxiety    Arthritis    Bipolar 1 disorder (HCC)    Bursitis    CAD (coronary artery disease)    Chronic pain    COPD (chronic obstructive pulmonary disease) (HCC)    Current use of long term anticoagulation    DAPT (ASA + clopidogrel)   Depression    Diverticulitis    Dyspnea    GERD (gastroesophageal reflux disease)    Grade I diastolic dysfunction    Hepatitis C 2012   No longer has Hep C   HLD (hyperlipidemia)    Hypertension    MI (myocardial infarction) ([place])    Polysubstance abuse (HCC)    cocaine, marijuana, ETOH   PUD (peptic ulcer disease)    S/P angioplasty with stent 06/10/2016   a.) 90% stenosis of pLAD to mLAD - 2.5 x 18 mm Xience Alpine (DES x 1) placed to pLAD   S/P PTCA (percutaneous transluminal  coronary angioplasty) 12/04/2019   a.) 60% in stent restenosis of DES to pLAD; LVEF 65%.   Schizophrenia ([place])    Stroke [facility])    Valvular insufficiency    a.) Mild MR, TR, PR; mild to moderate AR on 03/05/2018 TTE   Past Surgical History:  Procedure Laterality Date   ABDOMINAL SURGERY     removed small piece of intestines due to [facility] Diverticulosis   APPENDECTOMY     BACK SURGERY     CARDIAC CATHETERIZATION Left 06/10/2016   Procedure: Left Heart Cath and Coronary Angiography;  Surgeon: Dionisio David, MD;  Location: [facility] CV LAB;  Service: Cardiovascular;  Laterality: Left;   CARDIAC CATHETERIZATION N/A 06/10/2016   Procedure: Coronary Stent Intervention;  Surgeon: Yolonda Kida, MD;  Location: [place] CV LAB;  Service: Cardiovascular;  Laterality: N/A;   COLON SURGERY     COLONOSCOPY     COLONOSCOPY WITH PROPOFOL N/A 01/05/2017   Procedure: COLONOSCOPY WITH PROPOFOL;  Surgeon: Jonathon Bellows, MD;  Location: [facility] ENDOSCOPY;  Service: Endoscopy;  Laterality: N/A;   COLONOSCOPY WITH PROPOFOL N/A 02/13/2020   Procedure: COLONOSCOPY WITH PROPOFOL;  Surgeon: Virgel Manifold, MD;  Location: [facility] ENDOSCOPY;  Service: Endoscopy;  Laterality: N/A;   CORONARY ANGIOPLASTY WITH STENT PLACEMENT  ESOPHAGOGASTRODUODENOSCOPY (EGD) WITH PROPOFOL N/A 01/05/2017   Procedure: ESOPHAGOGASTRODUODENOSCOPY (EGD) WITH PROPOFOL;  Surgeon: Jonathon Bellows, MD;  Location: [facility] ENDOSCOPY;  Service: Endoscopy;  Laterality: N/A;   ESOPHAGOGASTRODUODENOSCOPY (EGD) WITH PROPOFOL N/A 02/13/2020   Procedure: ESOPHAGOGASTRODUODENOSCOPY (EGD) WITH PROPOFOL;  Surgeon: Virgel Manifold, MD;  Location: [facility] ENDOSCOPY;  Service: Endoscopy;  Laterality: N/A;   INTRAVASCULAR PRESSURE WIRE/FFR STUDY N/A 12/04/2019   Procedure: INTRAVASCULAR PRESSURE WIRE/FFR STUDY;  Surgeon: Sherren Mocha, MD;  Location: [place] CV LAB;  Service: Cardiovascular;  Laterality: N/A;   KNEE ARTHROSCOPY WITH MEDIAL  MENISECTOMY Right 09/05/2017   Procedure: KNEE ARTHROSCOPY WITH MEDIAL AND LATERAL  MENISECTOMY PARTIAL SYNOVECTOMY;  Surgeon: Hessie Knows, MD;  Location: [facility] ORS;  Service: Orthopedics;  Laterality: Right;   LEFT HEART CATH AND CORONARY ANGIOGRAPHY N/A 12/04/2019   Procedure: LEFT HEART CATH AND CORONARY ANGIOGRAPHY;  Surgeon: Sherren Mocha, MD;  Location: [facility] CV LAB;  Service: Cardiovascular;  Laterality: N/A;   SHOULDER SURGERY Right 04/09/2012   SPINE SURGERY     TOTAL KNEE ARTHROPLASTY Right 01/19/2021   Procedure: TOTAL KNEE ARTHROPLASTY - Rachelle Hora to Assist;  Surgeon: Hessie Knows, MD;  Location: [facility] ORS;  Service: Orthopedics;  Laterality: Right;   Social History:  reports that he has been smoking cigarettes. He has a 16.00 pack-year smoking history. He has never used smokeless tobacco. He reports current alcohol use of about 6.0 standard drinks per week. He reports that he does not currently use drugs after having used the following drugs: Cocaine and Marijuana.  Allergies  Allergen Reactions   Latuda [Lurasidone Hcl] Other (See Comments)    tremor    Saphris [Asenapine] Other (See Comments)    Increased tremors     Family History  Problem Relation Age of Onset   Osteoarthritis Mother    Heart disease Mother    Hypertension Mother    Depression Mother    Heart disease Father    Early death Father    Hypertension Father    Heart attack Father    Hypertension Sister    Prostate cancer Neg Hx    Bladder Cancer Neg Hx    Kidney cancer Neg Hx     Prior to Admission medications   Medication Sig Start Date End Date Taking? Authorizing Provider  acetaminophen (TYLENOL) 500 MG tablet Take 2 tablets (1,000 mg total) by mouth every 6 (six) hours. Patient taking differently: Take 1,000 mg by mouth daily as needed for headache. 01/20/21  Yes Duanne Guess, PA-C  albuterol (VENTOLIN HFA) 108 (90 Base) MCG/ACT inhaler Inhale 2 puffs into the lungs every 6 (six)  hours as needed for wheezing or shortness of breath. 05/28/19  Yes Charlott Rakes, MD  atorvastatin (LIPITOR) 80 MG tablet Take 80 mg by mouth daily. 08/28/20  Yes [provider]  azelastine (ASTELIN) 0.1 % nasal spray Place 1 spray into both nostrils 2 (two) times daily. Use in each nostril as directed   Yes [provider]  azithromycin (ZITHROMAX) 250 MG tablet Take 2 on day one then 1 daily x 4 days 12/14/21  Yes Tanda Rockers, MD  Budeson-Glycopyrrol-Formoterol (BREZTRI AEROSPHERE) 160-9-4.8 MCG/ACT AERO Take 2 puffs first thing in am and then another 2 puffs about 12 hours later. 12/14/21  Yes Tanda Rockers, MD  cetirizine (ZYRTEC) 10 MG tablet TAKE 1 TABLET BY MOUTH EVERY DAY Patient taking differently: Take 10 mg by mouth daily. 02/02/21  Yes Bacigalupo, Dionne Bucy, MD  clopidogrel (PLAVIX)  75 MG tablet Take 75 mg by mouth daily.   Yes [provider]  cyclobenzaprine (FLEXERIL) 10 MG tablet TAKE 1 TABLET BY MOUTH THREE TIMES A DAY Patient taking differently: Take 10 mg by mouth daily as needed for muscle spasms. 09/21/21  Yes Gwyneth Sprout, FNP  divalproex (DEPAKOTE) 250 MG DR tablet Take 1 tablet (250 mg total) by mouth 2 (two) times daily. Patient taking differently: Take 250 mg by mouth at bedtime. 11/15/21 11/15/22 Yes Freida Busman, MD  DULoxetine (CYMBALTA) 60 MG capsule TAKE 1 CAPSULE BY MOUTH EVERY DAY Patient taking differently: Take 60 mg by mouth daily. 02/02/21  Yes Bacigalupo, Dionne Bucy, MD  fluticasone (FLONASE) 50 MCG/ACT nasal spray Place 1 spray into both nostrils at bedtime.   Yes [provider]  loratadine (CLARITIN) 10 MG tablet Take 10 mg by mouth daily.   Yes [provider]  meloxicam (MOBIC) 15 MG tablet TAKE 1 TABLET (15 MG TOTAL) BY MOUTH DAILY. 10/26/21  Yes Bacigalupo, Dionne Bucy, MD  Multiple Vitamin (MULTIVITAMIN) tablet Take 1 tablet by mouth daily.   Yes [provider]  nitroGLYCERIN (NITROSTAT) 0.3 MG SL  tablet Place 0.3 mg under the tongue every 5 (five) minutes as needed for chest pain. 12/01/20  Yes [provider]  Omega-3 Fatty Acids (FISH OIL) 1200 MG CAPS Take 1,200 mg by mouth daily.   Yes [provider]  omeprazole (PRILOSEC) 40 MG capsule TAKE 1 CAPSULE (40 MG TOTAL) BY MOUTH DAILY. Patient taking differently: Take 40 mg by mouth daily. 06/08/21  Yes Bacigalupo, Dionne Bucy, MD  predniSONE (DELTASONE) 10 MG tablet Take  4 each am x 2 days,   2 each am x 2 days,  1 each am x 2 days and stop 12/14/21  Yes Tanda Rockers, MD  QUEtiapine (SEROQUEL) 200 MG tablet Take 1 tablet (200 mg total) by mouth at bedtime. 11/15/21 11/15/22 Yes Freida Busman, MD  rOPINIRole (REQUIP) 2 MG tablet TAKE 1 TABLET BY MOUTH EVERYDAY AT BEDTIME Patient taking differently: Take 2 mg by mouth at bedtime. 10/26/21  Yes Bacigalupo, Dionne Bucy, MD  SUBOXONE 8-2 MG FILM Place 1 Film under the tongue 2 (two) times daily. 08/27/21  Yes [provider]  tamsulosin (FLOMAX) 0.4 MG CAPS capsule TAKE 1 CAPSULE BY MOUTH EVERY DAY Patient taking differently: Take 0.4 mg by mouth daily. 06/08/21  Yes Virginia Crews, MD    Physical Exam: Vitals:   12/17/21 0030 12/17/21 0100 12/17/21 0130 12/17/21 0220  BP: 113/73 113/66 104/68 115/69  Pulse: 62 (!) 50 (!) 50 (!) 53  Resp: '18 18 18 14  '$ Temp:      TempSrc:      SpO2: 96% 97% 92% 95%  Weight:      Height:       Constitutional: NAD, calm, comfortable Eyes: PERRL, lids and conjunctivae normal ENMT: Mucous membranes are moist. Posterior pharynx clear of any exudate or lesions.Normal dentition.  Neck: normal, supple, no masses, no thyromegaly Respiratory: clear to auscultation bilaterally, no wheezing, no crackles. Normal respiratory effort. No accessory muscle use.  Cardiovascular: Regular rate and rhythm, no murmurs / rubs / gallops. No extremity edema. 2+ pedal pulses. No carotid bruits.  Abdomen: no tenderness, no masses palpated. No  hepatosplenomegaly. Bowel sounds positive.  Musculoskeletal: no clubbing / cyanosis. No joint deformity upper and lower extremities. Good ROM, no contractures. Normal muscle tone.  Skin: no rashes, lesions, ulcers. No induration Neurologic: CN 2-12  grossly intact. Sensation intact, DTR normal. Strength 5/5 in all 4.  Psychiatric: Normal judgment and insight. Alert and oriented x 3. Normal mood.   Data Reviewed:    CMP     Component Value Date/Time   NA 138 12/16/2021 2136   NA 137 10/07/2021 1320   K 3.4 (L) 12/16/2021 2136   CL 104 12/16/2021 2136   CL 103 05/11/2017 0000   CO2 25 12/16/2021 2136   CO2 23 05/11/2017 0000   GLUCOSE 107 (H) 12/16/2021 2136   BUN 18 12/16/2021 2136   BUN 18 10/07/2021 1320   CREATININE 0.77 12/16/2021 2136   CALCIUM 8.8 (L) 12/16/2021 2136   PROT 8.1 12/16/2021 2136   PROT 7.1 10/07/2021 1320   ALBUMIN 4.3 12/16/2021 2136   ALBUMIN 4.3 10/07/2021 1320   AST 19 12/16/2021 2136   ALT 16 12/16/2021 2136   ALKPHOS 68 12/16/2021 2136   BILITOT 0.5 12/16/2021 2136   BILITOT 0.3 10/07/2021 1320   GFRNONAA >60 12/16/2021 2136   GFRAA 90 09/08/2020 1409   CBC    Component Value Date/Time   WBC 11.7 (H) 12/16/2021 2136   RBC 4.07 (L) 12/16/2021 2136   HGB 13.0 12/16/2021 2136   HGB 13.6 08/31/2021 1408   HCT 37.6 (L) 12/16/2021 2136   HCT 39.6 08/31/2021 1408   PLT 293 12/16/2021 2136   PLT 200 08/31/2021 1408   MCV 92.4 12/16/2021 2136   MCV 90 08/31/2021 1408   MCH 31.9 12/16/2021 2136   MCHC 34.6 12/16/2021 2136   RDW 14.1 12/16/2021 2136   RDW 13.7 08/31/2021 1408   LYMPHSABS 2.6 12/16/2021 2136   LYMPHSABS 2.4 08/31/2021 1408   MONOABS 0.7 12/16/2021 2136   EOSABS 0.1 12/16/2021 2136   EOSABS 0.4 08/31/2021 1408   BASOSABS 0.0 12/16/2021 2136   BASOSABS 0.0 08/31/2021 1408   Trops of 23 and 48  CTA of C/A/P IMPRESSION: No evidence of thoracoabdominal aortic aneurysm or dissection.   Mild coronary artery calcification.    Multifocal pulmonary infiltrate. If acute, this may reflect changes of atypical infection. If chronic, additional considerations could include hypersensitivity pneumonitis or smoking related lung disease.   Mild emphysema.   Slightly spiculated opacity within the right upper lobe measuring 10 mm. Short-term follow-up imaging in 3 months is recommended to document stability or resolution and confirm or exclude the presence of an underlying focal pulmonary nodule.   Cholelithiasis. Gallbladder distension with tiny calcified gallstone seen at the origin of the cystic duct. Correlation with liver enzymes is recommended to exclude early changes of acute calculus cholecystitis.  Assessment and Plan: * Unstable angina (HCC) Pt may or may not have ACS. H/o CAD s/p Stent, ISR s/p re-angioplasty.  N/V and rising trops today. Though symptoms today are a bit atypical: mostly epigastric abd pain and TTP, not CP. No EKG changes. CT AP, lipase, and remainder of work up negative for other obvious possible cause of symptoms. N/V and abd pain symptoms all resolved for the moment. Dont have good alternative explanation for the troponin bump though. ACS pathway Start heparin gtt for the moment ASA 325 in ED then 81 daily Cont home plavix Tele monitor Admitting pt over to [facility] EDP calling cards for consult CT CAP = No dissection No ischemia Has multifocal patchy infiltrate (probably partially treated atypical PNA, see COPD below). No other obvious cause for epigastric pain. LFTs nl Lipase neg Check UDS given h/o cocaine abuse in past  CAD S/P percutaneous coronary  angioplasty CAD s/p LAD stent, had ISR treated with balloon angioplasty in 2021 See ACS above. On a more chronic note, im not sure why this patient is only on plavix and not DAPT given a history of ISR? Regardless, putting on DAPT until ACS ruled out And will defer to cards if he should be on long term DAPT.  COPD (chronic  obstructive pulmonary disease) ([place]) Recently treated by pulm as outpt for exacerbation with azithromycin dose pack (still 2 days left), and steroid taper (still 4 days left). Breathing improved after treatment per pt Suspect underlying atypical PNA that is partially treated is the cause of the patchy pulmonary infiltrate seen on CT today. Cont prednisone taper Cont azithromycin Cont home nebs Check COVID  Pulmonary nodule RUL slightly spiculated density. Needs f/u CT in 3 months.  Chronic musculoskeletal pain Cont home suboxone, flexeril.  Opiate use Continue suboxone      Advance Care Planning:   Code Status: Full Code  Consults: Cards  Family Communication: Mother at bedside  Severity of Illness: The appropriate patient status for this patient is OBSERVATION. Observation status is judged to be reasonable and necessary in order to provide the required intensity of service to ensure the patient's safety. The patient's presenting symptoms, physical exam findings, and initial radiographic and laboratory data in the context of their medical condition is felt to place them at decreased risk for further clinical deterioration. Furthermore, it is anticipated that the patient will be medically stable for discharge from the hospital within 2 midnights of admission.   Author: Etta Quill., DO 12/17/2021 2:27 AM  For on call review [email or web address].

## 2021-12-17 NOTE — Assessment & Plan Note (Signed)
Recently treated by pulm as outpt for exacerbation with azithromycin dose pack (still 2 days left), and steroid taper (still 4 days left). 1. Breathing improved after treatment per pt 2. Suspect underlying atypical PNA that is partially treated is the cause of the patchy pulmonary infiltrate seen on CT today. 3. Cont prednisone taper 4. Cont azithromycin 5. Cont home nebs 6. Check COVID

## 2021-12-17 NOTE — Progress Notes (Signed)
   12/17/21 1122  Assess: MEWS Score  Temp 97.9 F (36.6 C)  BP 106/65  Pulse Rate (!) 46  ECG Heart Rate (!) 46  Resp 12  Level of Consciousness Alert  SpO2 90 %  O2 Device Room Air  Assess: MEWS Score  MEWS Temp 0  MEWS Systolic 0  MEWS Pulse 1  MEWS RR 1  MEWS LOC 0  MEWS Score 2  MEWS Score Color Yellow  Assess: if the MEWS score is Yellow or Red  Were vital signs taken at a resting state? Yes  Focused Assessment No change from prior assessment  Early Detection of Sepsis Score *See Row Information* Low  MEWS guidelines implemented *See Row Information* Yes  Take Vital Signs  Increase Vital Sign Frequency  Yellow: Q 2hr X 2 then Q 4hr X 2, if remains yellow, continue Q 4hrs  Escalate  MEWS: Escalate Yellow: discuss with charge nurse/RN and consider discussing with provider and RRT  Notify: Charge Nurse/RN  Name of Charge Nurse/RN Notified Christy RN  Date Charge Nurse/RN Notified 12/17/21  Time Charge Nurse/RN Notified 1152  Document  Patient Outcome Stabilized after interventions  Progress note created (see row info) Yes   Pt has been SB at rest.  Asymptomatic.

## 2021-12-17 NOTE — ED Notes (Signed)
Pt advises being unable to provide urine sample at this time, will monitor. 

## 2021-12-17 NOTE — ED Notes (Signed)
Pt ambulatory without assistance.  

## 2021-12-17 NOTE — Discharge Instructions (Signed)
Follow up GI in one week.

## 2021-12-17 NOTE — ED Notes (Signed)
[facility] called, they are on the way for transport.

## 2021-12-17 NOTE — Assessment & Plan Note (Signed)
RUL slightly spiculated density. Needs f/u CT in 3 months.

## 2021-12-17 NOTE — Assessment & Plan Note (Signed)
Cont home suboxone, flexeril.

## 2021-12-17 NOTE — Discharge Summary (Signed)
Physician Discharge Summary  Keith Gibbs HBZ:[id number] DOB: 1960/08/21 DOA: 12/16/2021  PCP: Gildardo Pounds, NP  Admit date: 12/16/2021  Discharge date: 12/17/2021  Admitted From: Home Disposition:  Home  Recommendations for Outpatient Follow-up:  Follow up with PCP in 1-2 weeks Please obtain BMP/CBC in one week Follow up GI as scheduled.  Home Health:None Equipment/Devices:None  Discharge Condition:  Stable CODE STATUS:Full code Diet recommendation: Heart Healthy   Brief/Interim Summary: This 61 years old male with PMH significant for prior polysubstance abuse, HCV status posttreatment, chronic opioid dependence on Suboxone, hypertension, history of CAD 90% LAD stenosis status post DES to LAD in 2017 presented in the ED with epigastric pain, nausea and vomiting.  Patient recently had COPD exacerbation and was treated outpatient by PCP with steroids and Zithromax.  He reports improvement in respiratory symptoms.  Patient is found to have trending up troponins.  Patient was admitted for atypical chest pain.  Cardiology was consulted,  states seemed related to the GI etiology, No plan for any cardiac work-up.  Cardiology signed off.  Patient was seen and examined, reports feeling better.He wants to be discharged, states he will make appointment with Gastroenterology. Patient is being discharged.   Discharge Diagnoses:  Principal Problem:   Unstable angina (HCC) Active Problems:   COPD (chronic obstructive pulmonary disease) (HCC)   CAD S/P percutaneous coronary angioplasty   Opiate use   Chronic musculoskeletal pain   Pulmonary nodule    Discharge Instructions  Discharge Instructions     Call MD for:  difficulty breathing, headache or visual disturbances   Complete by: As directed    Call MD for:  persistant dizziness or light-headedness   Complete by: As directed    Call MD for:  persistant nausea and vomiting   Complete by: As directed    Diet - low sodium heart  healthy   Complete by: As directed    Diet Carb Modified   Complete by: As directed    Discharge instructions   Complete by: As directed    Advised to follow up GI as scheduled.   Increase activity slowly   Complete by: As directed       Allergies as of 12/17/2021       Reactions   Latuda [lurasidone Hcl] Other (See Comments)   tremor   Saphris [asenapine] Other (See Comments)   Increased tremors        Medication List     TAKE these medications    acetaminophen 500 MG tablet Commonly known as: TYLENOL Take 2 tablets (1,000 mg total) by mouth every 6 (six) hours. What changed:  when to take this reasons to take this   albuterol 108 (90 Base) MCG/ACT inhaler Commonly known as: VENTOLIN HFA Inhale 2 puffs into the lungs every 6 (six) hours as needed for wheezing or shortness of breath.   atorvastatin 80 MG tablet Commonly known as: LIPITOR Take 80 mg by mouth daily.   azelastine 0.1 % nasal spray Commonly known as: ASTELIN Place 1 spray into both nostrils 2 (two) times daily. Use in each nostril as directed   azithromycin 250 MG tablet Commonly known as: ZITHROMAX Take 2 on day one then 1 daily x 4 days   Breztri Aerosphere 160-9-4.8 MCG/ACT Aero Generic drug: Budeson-Glycopyrrol-Formoterol Take 2 puffs first thing in am and then another 2 puffs about 12 hours later.   cetirizine 10 MG tablet Commonly known as: ZYRTEC TAKE 1 TABLET BY MOUTH EVERY DAY  clopidogrel 75 MG tablet Commonly known as: PLAVIX Take 75 mg by mouth daily.   cyclobenzaprine 10 MG tablet Commonly known as: FLEXERIL TAKE 1 TABLET BY MOUTH THREE TIMES A DAY What changed:  when to take this reasons to take this   divalproex 250 MG DR tablet Commonly known as: Depakote Take 1 tablet (250 mg total) by mouth 2 (two) times daily. What changed: when to take this   DULoxetine 60 MG capsule Commonly known as: CYMBALTA TAKE 1 CAPSULE BY MOUTH EVERY DAY What changed: how much to  take   Fish Oil 1200 MG Caps Take 1,200 mg by mouth daily.   fluticasone 50 MCG/ACT nasal spray Commonly known as: FLONASE Place 1 spray into both nostrils at bedtime.   loratadine 10 MG tablet Commonly known as: CLARITIN Take 10 mg by mouth daily.   meloxicam 15 MG tablet Commonly known as: MOBIC TAKE 1 TABLET (15 MG TOTAL) BY MOUTH DAILY.   multivitamin tablet Take 1 tablet by mouth daily.   nitroGLYCERIN 0.3 MG SL tablet Commonly known as: NITROSTAT Place 0.3 mg under the tongue every 5 (five) minutes as needed for chest pain.   omeprazole 40 MG capsule Commonly known as: PRILOSEC TAKE 1 CAPSULE (40 MG TOTAL) BY MOUTH DAILY. What changed: how much to take   predniSONE 10 MG tablet Commonly known as: DELTASONE Take  4 each am x 2 days,   2 each am x 2 days,  1 each am x 2 days and stop   QUEtiapine 200 MG tablet Commonly known as: SEROQUEL Take 1 tablet (200 mg total) by mouth at bedtime.   rOPINIRole 2 MG tablet Commonly known as: REQUIP TAKE 1 TABLET BY MOUTH EVERYDAY AT BEDTIME What changed: See the new instructions.   Suboxone 8-2 MG Film Generic drug: Buprenorphine HCl-Naloxone HCl Place 1 Film under the tongue 2 (two) times daily.   tamsulosin 0.4 MG Caps capsule Commonly known as: FLOMAX TAKE 1 CAPSULE BY MOUTH EVERY DAY        Follow-up Information     Gildardo Pounds, NP Follow up.   Specialty: Nurse Practitioner Contact information: [place] [address] [phone]         Arnoldo Lenis, MD .   Specialty: Cardiology Contact information: [place] [address] [phone]                Allergies  Allergen Reactions   Anette Guarneri [Lurasidone Hcl] Other (See Comments)    tremor    Saphris [Asenapine] Other (See Comments)    Increased tremors     Consultations: [facility]   Procedures/Studies: DG Chest Portable 1 View  Result Date: 12/16/2021 CLINICAL  DATA:  Epigastric pain. EXAM: PORTABLE CHEST 1 VIEW COMPARISON:  Chest x-ray 11/12/2021 FINDINGS: The heart size and mediastinal contours are within normal limits. Both lungs are clear. The visualized skeletal structures are unremarkable. IMPRESSION: No active disease. Electronically Signed   By: Ronney Asters M.D.   On: 12/16/2021 21:46   CT Angio Chest/Abd/Pel for Dissection W and/or Wo Contrast  Result Date: 12/16/2021 CLINICAL DATA:  Acute aortic syndrome (AAS) suspected. Vomiting, upper abdominal pain EXAM: CT ANGIOGRAPHY CHEST, ABDOMEN AND PELVIS TECHNIQUE: Non-contrast CT of the chest was initially obtained. Multidetector CT imaging through the chest, abdomen and pelvis was performed using the standard protocol during bolus administration of intravenous contrast. Multiplanar reconstructed images and MIPs were obtained and reviewed to  evaluate the vascular anatomy. RADIATION DOSE REDUCTION: This exam was performed according to the departmental dose-optimization program which includes automated exposure control, adjustment of the mA and/or kV according to patient size and/or use of iterative reconstruction technique. CONTRAST:  134m OMNIPAQUE IOHEXOL 350 MG/ML SOLN COMPARISON:  CT abdomen pelvis 11/23/2018 FINDINGS: CTA CHEST FINDINGS Cardiovascular: Mild coronary artery calcification. Global cardiac size within normal limits. No pericardial effusion. Central pulmonary arteries are of normal caliber. The thoracic aorta is normal in course and caliber. No intramural hematoma, dissection, or aneurysm. Arch vasculature demonstrates classic anatomic configuration and is widely patent proximally. Mediastinum/Nodes: There is shotty bilateral hilar, prevascular, and right paratracheal lymphadenopathy, possibly reactive in nature. No frankly pathologic thoracic adenopathy. Visualized thyroid is unremarkable. Esophagus is unremarkable. Lungs/Pleura: Moderate emphysema. Superimposed diffuse multifocal ground-glass  pulmonary infiltrate is present demonstrating an upper lobe predominance, likely infectious or inflammatory in the acute setting. If chronic, this may reflect changes of hypersensitivity pneumonitis or smoking related lung disease. There is mild bronchial wall thickening in keeping with airway inflammation. There is a more focal 10 mm spiculated opacity within the right upper lobe at axial image # 53/5, which may represent more focal airspace infiltrate on a underlying pulmonary nodule. No pneumothorax or pleural effusion. No central obstructing mass. Musculoskeletal: No acute bone abnormality. No lytic or blastic bone lesion. Review of the MIP images confirms the above findings. CTA ABDOMEN AND PELVIS FINDINGS VASCULAR Aorta: Normal caliber aorta without aneurysm, dissection, vasculitis or significant stenosis. Mild atherosclerotic calcification Celiac: Patent without evidence of aneurysm, dissection, vasculitis or significant stenosis. SMA: Less than 50% stenosis of the superior mesenteric artery proximally. Distally, the vessel is widely patent. No aneurysm or dissection. Renals: Single left and dual right renal arteries are widely patent and demonstrate normal vascular morphology. No aneurysm or dissection. IMA: Patent without evidence of aneurysm, dissection, vasculitis or significant stenosis. Inflow: Patent without evidence of aneurysm, dissection, vasculitis or significant stenosis. Veins: No obvious venous abnormality within the limitations of this arterial phase study. Review of the MIP images confirms the above findings. NON-VASCULAR Hepatobiliary: The gallbladder is distended. A a dependently layering stone is seen within the gallbladder lumen. A tiny calcified stone is seen within the origin of the cystic duct, best seen on axial image # 122/6. No pericholecystic inflammatory changes identified. 8 mm simple cyst within the right hepatic lobe. Liver otherwise unremarkable. No intra or extrahepatic  biliary ductal dilation Pancreas: Unremarkable. No pancreatic ductal dilatation or surrounding inflammatory changes. Spleen: Unremarkable Adrenals/Urinary Tract: Adrenal glands are unremarkable. Kidneys are normal, without renal calculi, focal lesion, or hydronephrosis. Bladder is unremarkable. Stomach/Bowel: Stomach, small bowel, and large bowel are unremarkable. Appendix is not clearly identified and is likely absent. No free intraperitoneal gas fluid. Lymphatic: No pathologic adenopathy within the abdomen and pelvis. Reproductive: Prostate is unremarkable. Other: No abdominal wall hernia. Musculoskeletal: Postsurgical changes of L4-S1 lumbar fusion with instrumentation and right L5 hemilaminectomy are identified. No acute bone abnormality. Review of the MIP images confirms the above findings. IMPRESSION: No evidence of thoracoabdominal aortic aneurysm or dissection. Mild coronary artery calcification. Multifocal pulmonary infiltrate. If acute, this may reflect changes of atypical infection. If chronic, additional considerations could include hypersensitivity pneumonitis or smoking related lung disease. Mild emphysema. Slightly spiculated opacity within the right upper lobe measuring 10 mm. Short-term follow-up imaging in 3 months is recommended to document stability or resolution and confirm or exclude the presence of an underlying focal pulmonary nodule. Cholelithiasis. Gallbladder distension with tiny calcified  gallstone seen at the origin of the cystic duct. Correlation with liver enzymes is recommended to exclude early changes of acute calculus cholecystitis. Aortic Atherosclerosis (ICD10-I70.0) and Emphysema (ICD10-J43.9). Electronically Signed   By: Fidela Salisbury M.D.   On: 12/16/2021 23:12      Subjective: Patient reports feeling better and wants to go home.  Discharge Exam: Vitals:   12/17/21 1122 12/17/21 1130  BP: 106/65   Pulse: (!) 46 (!) 47  Resp: 12 14  Temp: 97.9 F (36.6 C)   SpO2:  90% 94%   Vitals:   12/17/21 0311 12/17/21 0755 12/17/21 1122 12/17/21 1130  BP: (!) 111/55 107/62 106/65   Pulse: (!) 48 (!) 44 (!) 46 (!) 47  Resp: '15 15 12 14  '$ Temp:  97.7 F (36.5 C) 97.9 F (36.6 C)   TempSrc:  Oral Oral   SpO2: 95% 95% 90% 94%  Weight: 95.2 kg     Height:        General: Pt is alert, awake, not in acute distress Cardiovascular: RRR, S1/S2 +, no rubs, no gallops Respiratory: CTA bilaterally, no wheezing, no rhonchi Abdominal: Soft, NT, ND, bowel sounds + Extremities: no edema, no cyanosis    The results of significant diagnostics from this hospitalization (including imaging, microbiology, ancillary and laboratory) are listed below for reference.     Microbiology: Recent Results (from the past 240 hour(s))  SARS Coronavirus 2 by RT PCR (hospital order, performed in [facility] hospital lab) *cepheid single result test* Anterior Nasal Swab     Status: None   Collection Time: 12/17/21  1:48 AM   Specimen: Anterior Nasal Swab  Result Value Ref Range Status   SARS Coronavirus 2 by RT PCR NEGATIVE NEGATIVE Final    Comment: (NOTE) SARS-CoV-2 target nucleic acids are NOT DETECTED.  The SARS-CoV-2 RNA is generally detectable in upper and lower respiratory specimens during the acute phase of infection. The lowest concentration of SARS-CoV-2 viral copies this assay can detect is 250 copies / mL. A negative result does not preclude SARS-CoV-2 infection and should not be used as the sole basis for treatment or other patient management decisions.  A negative result may occur with improper specimen collection / handling, submission of specimen other than nasopharyngeal swab, presence of viral mutation(s) within the areas targeted by this assay, and inadequate number of viral copies (<250 copies / mL). A negative result must be combined with clinical observations, patient history, and epidemiological information.  Fact Sheet for Patients:    https://www.patel.info/  Fact Sheet for Healthcare Providers: https://hall.com/  This test is not yet approved or  cleared by the [place] FDA and has been authorized for detection and/or diagnosis of SARS-CoV-2 by FDA under an Emergency Use Authorization (EUA).  This EUA will remain in effect (meaning this test can be used) for the duration of the COVID-19 declaration under Section 564(b)(1) of the Act, 21 U.S.C. section 360bbb-3(b)(1), unless the authorization is terminated or revoked sooner.  Performed at [facility], [place] [address] [place], [place] [address]      Labs: BNP (last 3 results) No results for input(s): BNP in the last 8760 hours. Basic Metabolic Panel: Recent Labs  Lab 12/16/21 2136 12/17/21 0955  NA 138 137  K 3.4* 3.5  CL 104 103  CO2 25 27  GLUCOSE 107* 84  BUN 18 13  CREATININE 0.77 0.85  CALCIUM 8.8* 8.6*   Liver Function Tests: Recent Labs  Lab 12/16/21 2136 12/17/21  0955  AST 19 17  ALT 16 15  ALKPHOS 68 56  BILITOT 0.5 0.4  PROT 8.1 6.2*  ALBUMIN 4.3 3.2*   Recent Labs  Lab 12/16/21 2136  LIPASE 38   No results for input(s): AMMONIA in the last 168 hours. CBC: Recent Labs  Lab 12/16/21 2136 12/17/21 0955  WBC 11.7* 7.4  NEUTROABS 8.2*  --   HGB 13.0 11.4*  HCT 37.6* 35.4*  MCV 92.4 95.2  PLT 293 241   Cardiac Enzymes: Recent Labs  Lab 12/16/21 2136  CKTOTAL 99   BNP: Invalid input(s): POCBNP CBG: No results for input(s): GLUCAP in the last 168 hours. D-Dimer No results for input(s): DDIMER in the last 72 hours. Hgb A1c No results for input(s): HGBA1C in the last 72 hours. Lipid Profile No results for input(s): CHOL, HDL, LDLCALC, TRIG, CHOLHDL, LDLDIRECT in the last 72 hours. Thyroid function studies No results for input(s): TSH, T4TOTAL, T3FREE, THYROIDAB in the last 72 hours.  Invalid input(s): FREET3 Anemia work up No results for  input(s): VITAMINB12, FOLATE, FERRITIN, TIBC, IRON, RETICCTPCT in the last 72 hours. Urinalysis    Component Value Date/Time   COLORURINE YELLOW 12/16/2021 2150   APPEARANCEUR CLEAR 12/16/2021 2150   APPEARANCEUR Clear 05/18/2021 1048   LABSPEC 1.015 12/16/2021 2150   PHURINE 7.0 12/16/2021 2150   GLUCOSEU NEGATIVE 12/16/2021 2150   HGBUR SMALL (A) 12/16/2021 2150   BILIRUBINUR NEGATIVE 12/16/2021 2150   BILIRUBINUR Negative 05/18/2021 1048   KETONESUR NEGATIVE 12/16/2021 2150   PROTEINUR NEGATIVE 12/16/2021 2150   UROBILINOGEN 0.2 12/12/2019 0905   NITRITE NEGATIVE 12/16/2021 2150   LEUKOCYTESUR NEGATIVE 12/16/2021 2150   Sepsis Labs Invalid input(s): PROCALCITONIN,  WBC,  LACTICIDVEN Microbiology Recent Results (from the past 240 hour(s))  SARS Coronavirus 2 by RT PCR (hospital order, performed in [facility] hospital lab) *cepheid single result test* Anterior Nasal Swab     Status: None   Collection Time: 12/17/21  1:48 AM   Specimen: Anterior Nasal Swab  Result Value Ref Range Status   SARS Coronavirus 2 by RT PCR NEGATIVE NEGATIVE Final    Comment: (NOTE) SARS-CoV-2 target nucleic acids are NOT DETECTED.  The SARS-CoV-2 RNA is generally detectable in upper and lower respiratory specimens during the acute phase of infection. The lowest concentration of SARS-CoV-2 viral copies this assay can detect is 250 copies / mL. A negative result does not preclude SARS-CoV-2 infection and should not be used as the sole basis for treatment or other patient management decisions.  A negative result may occur with improper specimen collection / handling, submission of specimen other than nasopharyngeal swab, presence of viral mutation(s) within the areas targeted by this assay, and inadequate number of viral copies (<250 copies / mL). A negative result must be combined with clinical observations, patient history, and epidemiological information.  Fact Sheet for Patients:    https://www.patel.info/  Fact Sheet for Healthcare Providers: https://hall.com/  This test is not yet approved or  cleared by the [place] FDA and has been authorized for detection and/or diagnosis of SARS-CoV-2 by FDA under an Emergency Use Authorization (EUA).  This EUA will remain in effect (meaning this test can be used) for the duration of the COVID-19 declaration under Section 564(b)(1) of the Act, 21 U.S.C. section 360bbb-3(b)(1), unless the authorization is terminated or revoked sooner.  Performed at [facility], [place] [address], [place], [place] [address]      Time coordinating discharge: Over 30 minutes  SIGNED:   Shawna Clamp, MD  [facility] 12/17/2021, 5:23 PM Pager   If 7PM-7AM, please contact night-coverage

## 2021-12-17 NOTE — Assessment & Plan Note (Signed)
Continue suboxone

## 2021-12-17 NOTE — Consult Note (Addendum)
Cardiology Consultation:   Patient ID: Keith Gibbs MRN: [id number]; DOB: 1960-12-20  Admit date: 12/16/2021 Date of Consult: 12/17/2021  PCP:  Gildardo Pounds, NP   [facility] Providers Cardiologist:  Carlyle Dolly, MD    Patient Profile:   Keith Gibbs is a 61 y.o. male with a hx of CAD, hypertension, hyperlipidemia, polysubstance abuse, HCV s/p treatment, chronic opioid dependence on Suboxone, COPD, GERD, valvular heart disease, current smoker, and bipolar 1 disorder who is being seen 12/17/2021 for the evaluation of elevated troponin and chest pain at the request of Dr. Dwyane Dee.  History of Present Illness:   Keith Gibbs establish care with Dr. Harl Bowie in March 2021. He previously followed with Dr. Humphrey Rolls. He had a coronary CTA in 2017 that showed 90% lesion in the mid LAD in the setting of unstable angina.  This led to a heart catheterization and he subsequently had DES to LAD for a 90% stenosis in 06/10/2016.  Nuclear stress test in 02/2018 with moderate inferior reversible defect.  He was seen in 09/2019 with chest pain and taken back to the Cath Lab 11/21/19 which showed previously placed mid LAD stent patent with the proximal end of the stent with 60% in-stent restenosis treated with balloon angioplasty.  He had a patent left main, LCx, and RCA.  Normal LV function at 65%.  He was last seen in follow-up 01/08/2020 with Dr. Harl Bowie.  He reported his chest pain has since improved.  Plavix was discontinued.  He was continued on aspirin and 80 mg Lipitor.  He presented to [facility] ED 12/16/2021 with epigastric pain and vomiting for 5 hours.  He reports alcohol use only occasionally.  He does have a history of colon resection at age 66 due to type diverticulitis.  Pain radiated to his back, CTA negative for dissection.  No intra-abdominal findings to explain abdominal pain.    UDS negative HS troponin mildly elevated 23 --> 48.  WBC 11.7 --> 7.4 Hb 13.0 --> 11.4 Lipase 38  CTA concerning  for multifocal pulmonary infiltrate.  Unclear if this is acute but may reflect atypical infection.  CTA with 10 mm spiculated opacity in RUL suspicious for underlying pulmonary nodule.   Cardiology was consulted for epigastric/chest pain and mildly elevated troponin.  Home medications continued to reflect both aspirin and Plavix.  He has 12 months from his last PCI and may stop Plavix.  During my interview, he describes an unintentional weight loss of 14 lbs over the last 2 weeks. In addition, he was having night sweats one month ago. This timeline is complicated by COVID infection 2 months ago and starting new psych medications in the past 4-6 weeks.   Mother at bedside helps with history and states he has not had a good appetite over the last 2 weeks. With further questioning, he has been in contact with several (at least three family/friends) who have been vomiting with suspected GI illness. He has had resolution of nausea, vomiting, and epigastric pain after zofran.    Past Medical History:  Diagnosis Date   Anginal pain ([place])    Anxiety    Arthritis    Bipolar 1 disorder (HCC)    Bursitis    CAD (coronary artery disease)    Chronic pain    COPD (chronic obstructive pulmonary disease) (HCC)    Current use of long term anticoagulation    DAPT (ASA + clopidogrel)   Depression    Diverticulitis    Dyspnea  GERD (gastroesophageal reflux disease)    Grade I diastolic dysfunction    Hepatitis C 2012   No longer has Hep C   HLD (hyperlipidemia)    Hypertension    MI (myocardial infarction) ([place])    Polysubstance abuse (HCC)    cocaine, marijuana, ETOH   PUD (peptic ulcer disease)    S/P angioplasty with stent 06/10/2016   a.) 90% stenosis of pLAD to mLAD - 2.5 x 18 mm Xience Alpine (DES x 1) placed to pLAD   S/P PTCA (percutaneous transluminal coronary angioplasty) 12/04/2019   a.) 60% in stent restenosis of DES to pLAD; LVEF 65%.   Schizophrenia ([place])    Stroke [facility])     Valvular insufficiency    a.) Mild MR, TR, PR; mild to moderate AR on 03/05/2018 TTE    Past Surgical History:  Procedure Laterality Date   ABDOMINAL SURGERY     removed small piece of intestines due to [facility] Diverticulosis   APPENDECTOMY     BACK SURGERY     CARDIAC CATHETERIZATION Left 06/10/2016   Procedure: Left Heart Cath and Coronary Angiography;  Surgeon: Dionisio David, MD;  Location: [place] CV LAB;  Service: Cardiovascular;  Laterality: Left;   CARDIAC CATHETERIZATION N/A 06/10/2016   Procedure: Coronary Stent Intervention;  Surgeon: Yolonda Kida, MD;  Location: [place] CV LAB;  Service: Cardiovascular;  Laterality: N/A;   COLON SURGERY     COLONOSCOPY     COLONOSCOPY WITH PROPOFOL N/A 01/05/2017   Procedure: COLONOSCOPY WITH PROPOFOL;  Surgeon: Jonathon Bellows, MD;  Location: [facility] ENDOSCOPY;  Service: Endoscopy;  Laterality: N/A;   COLONOSCOPY WITH PROPOFOL N/A 02/13/2020   Procedure: COLONOSCOPY WITH PROPOFOL;  Surgeon: Virgel Manifold, MD;  Location: [facility] ENDOSCOPY;  Service: Endoscopy;  Laterality: N/A;   CORONARY ANGIOPLASTY WITH STENT PLACEMENT     ESOPHAGOGASTRODUODENOSCOPY (EGD) WITH PROPOFOL N/A 01/05/2017   Procedure: ESOPHAGOGASTRODUODENOSCOPY (EGD) WITH PROPOFOL;  Surgeon: Jonathon Bellows, MD;  Location: [facility] ENDOSCOPY;  Service: Endoscopy;  Laterality: N/A;   ESOPHAGOGASTRODUODENOSCOPY (EGD) WITH PROPOFOL N/A 02/13/2020   Procedure: ESOPHAGOGASTRODUODENOSCOPY (EGD) WITH PROPOFOL;  Surgeon: Virgel Manifold, MD;  Location: [facility] ENDOSCOPY;  Service: Endoscopy;  Laterality: N/A;   INTRAVASCULAR PRESSURE WIRE/FFR STUDY N/A 12/04/2019   Procedure: INTRAVASCULAR PRESSURE WIRE/FFR STUDY;  Surgeon: Sherren Mocha, MD;  Location: [place] CV LAB;  Service: Cardiovascular;  Laterality: N/A;   KNEE ARTHROSCOPY WITH MEDIAL MENISECTOMY Right 09/05/2017   Procedure: KNEE ARTHROSCOPY WITH MEDIAL AND LATERAL  MENISECTOMY PARTIAL SYNOVECTOMY;  Surgeon: Hessie Knows, MD;  Location: [facility] ORS;  Service: Orthopedics;  Laterality: Right;   LEFT HEART CATH AND CORONARY ANGIOGRAPHY N/A 12/04/2019   Procedure: LEFT HEART CATH AND CORONARY ANGIOGRAPHY;  Surgeon: Sherren Mocha, MD;  Location: [place] CV LAB;  Service: Cardiovascular;  Laterality: N/A;   SHOULDER SURGERY Right 04/09/2012   SPINE SURGERY     TOTAL KNEE ARTHROPLASTY Right 01/19/2021   Procedure: TOTAL KNEE ARTHROPLASTY - Rachelle Hora to Assist;  Surgeon: Hessie Knows, MD;  Location: [facility] ORS;  Service: Orthopedics;  Laterality: Right;     Home Medications:  Prior to Admission medications   Medication Sig Start Date End Date Taking? Authorizing Provider  acetaminophen (TYLENOL) 500 MG tablet Take 2 tablets (1,000 mg total) by mouth every 6 (six) hours. Patient taking differently: Take 1,000 mg by mouth daily as needed for headache. 01/20/21  Yes Duanne Guess, PA-C  albuterol (VENTOLIN HFA) 108 (90 Base) MCG/ACT inhaler Inhale 2  puffs into the lungs every 6 (six) hours as needed for wheezing or shortness of breath. 05/28/19  Yes Charlott Rakes, MD  atorvastatin (LIPITOR) 80 MG tablet Take 80 mg by mouth daily. 08/28/20  Yes [provider]  azelastine (ASTELIN) 0.1 % nasal spray Place 1 spray into both nostrils 2 (two) times daily. Use in each nostril as directed   Yes [provider]  azithromycin (ZITHROMAX) 250 MG tablet Take 2 on day one then 1 daily x 4 days 12/14/21  Yes Tanda Rockers, MD  Budeson-Glycopyrrol-Formoterol (BREZTRI AEROSPHERE) 160-9-4.8 MCG/ACT AERO Take 2 puffs first thing in am and then another 2 puffs about 12 hours later. 12/14/21  Yes Tanda Rockers, MD  cetirizine (ZYRTEC) 10 MG tablet TAKE 1 TABLET BY MOUTH EVERY DAY Patient taking differently: Take 10 mg by mouth daily. 02/02/21  Yes Bacigalupo, Dionne Bucy, MD  clopidogrel (PLAVIX) 75 MG tablet Take 75 mg by mouth daily.   Yes [provider]  cyclobenzaprine (FLEXERIL) 10 MG tablet  TAKE 1 TABLET BY MOUTH THREE TIMES A DAY Patient taking differently: Take 10 mg by mouth daily as needed for muscle spasms. 09/21/21  Yes Gwyneth Sprout, FNP  divalproex (DEPAKOTE) 250 MG DR tablet Take 1 tablet (250 mg total) by mouth 2 (two) times daily. Patient taking differently: Take 250 mg by mouth at bedtime. 11/15/21 11/15/22 Yes Freida Busman, MD  DULoxetine (CYMBALTA) 60 MG capsule TAKE 1 CAPSULE BY MOUTH EVERY DAY Patient taking differently: Take 60 mg by mouth daily. 02/02/21  Yes Bacigalupo, Dionne Bucy, MD  fluticasone (FLONASE) 50 MCG/ACT nasal spray Place 1 spray into both nostrils at bedtime.   Yes [provider]  loratadine (CLARITIN) 10 MG tablet Take 10 mg by mouth daily.   Yes [provider]  meloxicam (MOBIC) 15 MG tablet TAKE 1 TABLET (15 MG TOTAL) BY MOUTH DAILY. 10/26/21  Yes Bacigalupo, Dionne Bucy, MD  Multiple Vitamin (MULTIVITAMIN) tablet Take 1 tablet by mouth daily.   Yes [provider]  nitroGLYCERIN (NITROSTAT) 0.3 MG SL tablet Place 0.3 mg under the tongue every 5 (five) minutes as needed for chest pain. 12/01/20  Yes [provider]  Omega-3 Fatty Acids (FISH OIL) 1200 MG CAPS Take 1,200 mg by mouth daily.   Yes [provider]  omeprazole (PRILOSEC) 40 MG capsule TAKE 1 CAPSULE (40 MG TOTAL) BY MOUTH DAILY. Patient taking differently: Take 40 mg by mouth daily. 06/08/21  Yes Bacigalupo, Dionne Bucy, MD  predniSONE (DELTASONE) 10 MG tablet Take  4 each am x 2 days,   2 each am x 2 days,  1 each am x 2 days and stop 12/14/21  Yes Tanda Rockers, MD  QUEtiapine (SEROQUEL) 200 MG tablet Take 1 tablet (200 mg total) by mouth at bedtime. 11/15/21 11/15/22 Yes Freida Busman, MD  rOPINIRole (REQUIP) 2 MG tablet TAKE 1 TABLET BY MOUTH EVERYDAY AT BEDTIME Patient taking differently: Take 2 mg by mouth at bedtime. 10/26/21  Yes Bacigalupo, Dionne Bucy, MD  SUBOXONE 8-2 MG FILM Place 1 Film under the tongue 2 (two) times daily. 08/27/21  Yes  [provider]  tamsulosin (FLOMAX) 0.4 MG CAPS capsule TAKE 1 CAPSULE BY MOUTH EVERY DAY Patient taking differently: Take 0.4 mg by mouth daily. 06/08/21  Yes Bacigalupo, Dionne Bucy, MD    Inpatient Medications: Scheduled Meds:  [START ON 12/18/2021] aspirin EC  81 mg Oral Daily   atorvastatin  80 mg Oral Daily  azelastine  1 spray Each Nare BID   azithromycin  250 mg Oral Daily   buprenorphine-naloxone  1 tablet Sublingual BID   clopidogrel  75 mg Oral Daily   divalproex  250 mg Oral QHS   DULoxetine  60 mg Oral Daily   fluticasone  1 spray Each Nare Daily   loratadine  10 mg Oral Daily   umeclidinium bromide  1 puff Inhalation Daily   And   mometasone-formoterol  2 puff Inhalation BID   multivitamin with minerals  1 tablet Oral Daily   pantoprazole  80 mg Oral Daily   [START ON 12/19/2021] predniSONE  10 mg Oral Q breakfast   predniSONE  20 mg Oral Q breakfast   QUEtiapine  200 mg Oral QHS   rOPINIRole  2 mg Oral QHS   tamsulosin  0.4 mg Oral Daily   Continuous Infusions:  heparin 1,500 Units/hr (12/17/21 1133)   PRN Meds: acetaminophen, albuterol, cyclobenzaprine, ondansetron (ZOFRAN) IV  Allergies:    Allergies  Allergen Reactions   Latuda [Lurasidone Hcl] Other (See Comments)    tremor    Saphris [Asenapine] Other (See Comments)    Increased tremors     Social History:   Social History   Socioeconomic History   Marital status: Widowed    Spouse name: Not on file   Number of children: Not on file   Years of education: Not on file   Highest education level: Not on file  Occupational History   Occupation: disability   Occupation: stocking at gas station    Comment: 2 days per week  Tobacco Use   Smoking status: Some Days    Packs/day: 0.50    Years: 32.00    Pack years: 16.00    Types: Cigarettes    Last attempt to quit: 10/25/2021    Years since quitting: 0.1   Smokeless tobacco: Never   Tobacco comments:    Continues to smoke approximately  5 cigarettes per day.  Has been 2 days since las cigarette.  Has not bought any more.  Using patches and gum.  5/23 hfb  Vaping Use   Vaping Use: Never used  Substance and Sexual Activity   Alcohol use: Yes    Alcohol/week: 6.0 standard drinks    Types: 6 Cans of beer per week    Comment: occassionally    Drug use: Not Currently    Types: Cocaine, Marijuana    Comment: last on saturday   Sexual activity: Yes    Partners: Female    Birth control/protection: None  Other Topics Concern   Not on file  Social History Narrative   Not on file   Social Determinants of Health   Financial Resource Strain: Not on file  Food Insecurity: Not on file  Transportation Needs: Not on file  Physical Activity: Not on file  Stress: Not on file  Social Connections: Not on file  Intimate Partner Violence: Not on file    Family History:    Family History  Problem Relation Age of Onset   Osteoarthritis Mother    Heart disease Mother    Hypertension Mother    Depression Mother    Heart disease Father    Early death Father    Hypertension Father    Heart attack Father    Hypertension Sister    Prostate cancer Neg Hx    Bladder Cancer Neg Hx    Kidney cancer Neg Hx      ROS:  Please  see the history of present illness.   All other ROS reviewed and negative.     Physical Exam/Data:   Vitals:   12/17/21 0249 12/17/21 0311 12/17/21 0755 12/17/21 1122  BP:  (!) 111/55 107/62 106/65  Pulse:  (!) 48 (!) 44 (!) 46  Resp:  '15 15 12  '$ Temp: 98.7 F (37.1 C)  97.7 F (36.5 C) 97.9 F (36.6 C)  TempSrc: Oral  Oral Oral  SpO2:  95% 95% 90%  Weight:  95.2 kg    Height:        Intake/Output Summary (Last 24 hours) at 12/17/2021 1140 Last data filed at 12/17/2021 0800 Gross per 24 hour  Intake 96.52 ml  Output 620 ml  Net -523.48 ml      12/17/2021    3:11 AM 12/16/2021    8:53 PM 12/14/2021    9:02 AM  Last 3 Weights  Weight (lbs) 209 lb 14.4 oz 220 lb 214 lb  Weight (kg) 95.21 kg  99.791 kg 97.07 kg     Body mass index is 26.24 kg/m.  General:  Well nourished, well developed, in no acute distress HEENT: normal Neck: no JVD Vascular: Distal pulses 2+ bilaterally Cardiac:  normal S1, S2; RRR; no murmur  Lungs:  rhonchus sounds and wheezing throughout  Abd: soft, tender to palpation in the epigastric area Ext: no edema Musculoskeletal:  No deformities, BUE and BLE strength normal and equal Skin: warm and dry  Neuro:  CNs 2-12 intact, no focal abnormalities noted Psych:  Normal affect   EKG:  The EKG was personally reviewed and demonstrates:  sinus bradycardia with HR 49 Telemetry:  Telemetry was personally reviewed and demonstrates:  sinus bradycardia with HR 50s  Relevant CV Studies:  Left heart cath 12/04/2019 Previously placed Mid LAD drug eluting stent is widely patent. Balloon angioplasty was performed. Prox LAD to Mid LAD lesion is 60% stenosed. The left ventricular systolic function is normal. LV end diastolic pressure is normal. The left ventricular ejection fraction is greater than 65% by visual estimate.   1. Single vessel CAD with moderate in-stent restenosis in the LAD, negative pressure wire evaluation 2. Patent left main, LCx, and RCA without significant stenosis 3. Normal LV function, LVEF 65%   Recommend: medical therapy, tobacco cessation   Laboratory Data:  High Sensitivity Troponin:   Recent Labs  Lab 12/16/21 2136 12/17/21 0011  TROPONINIHS 23* 48*     Chemistry Recent Labs  Lab 12/16/21 2136 12/17/21 0955  NA 138 137  K 3.4* 3.5  CL 104 103  CO2 25 27  GLUCOSE 107* 84  BUN 18 13  CREATININE 0.77 0.85  CALCIUM 8.8* 8.6*  GFRNONAA >60 >60  ANIONGAP 9 7    Recent Labs  Lab 12/16/21 2136 12/17/21 0955  PROT 8.1 6.2*  ALBUMIN 4.3 3.2*  AST 19 17  ALT 16 15  ALKPHOS 68 56  BILITOT 0.5 0.4   Lipids No results for input(s): CHOL, TRIG, HDL, LABVLDL, LDLCALC, CHOLHDL in the last 168 hours.  Hematology Recent  Labs  Lab 12/16/21 2136 12/17/21 0955  WBC 11.7* 7.4  RBC 4.07* 3.72*  HGB 13.0 11.4*  HCT 37.6* 35.4*  MCV 92.4 95.2  MCH 31.9 30.6  MCHC 34.6 32.2  RDW 14.1 14.3  PLT 293 241   Thyroid No results for input(s): TSH, FREET4 in the last 168 hours.  BNPNo results for input(s): BNP, PROBNP in the last 168 hours.  DDimer No results for input(s):  DDIMER in the last 168 hours.   Radiology/Studies:  DG Chest Portable 1 View  Result Date: 12/16/2021 CLINICAL DATA:  Epigastric pain. EXAM: PORTABLE CHEST 1 VIEW COMPARISON:  Chest x-ray 11/12/2021 FINDINGS: The heart size and mediastinal contours are within normal limits. Both lungs are clear. The visualized skeletal structures are unremarkable. IMPRESSION: No active disease. Electronically Signed   By: Ronney Asters M.D.   On: 12/16/2021 21:46   CT Angio Chest/Abd/Pel for Dissection W and/or Wo Contrast  Result Date: 12/16/2021 CLINICAL DATA:  Acute aortic syndrome (AAS) suspected. Vomiting, upper abdominal pain EXAM: CT ANGIOGRAPHY CHEST, ABDOMEN AND PELVIS TECHNIQUE: Non-contrast CT of the chest was initially obtained. Multidetector CT imaging through the chest, abdomen and pelvis was performed using the standard protocol during bolus administration of intravenous contrast. Multiplanar reconstructed images and MIPs were obtained and reviewed to evaluate the vascular anatomy. RADIATION DOSE REDUCTION: This exam was performed according to the departmental dose-optimization program which includes automated exposure control, adjustment of the mA and/or kV according to patient size and/or use of iterative reconstruction technique. CONTRAST:  146m OMNIPAQUE IOHEXOL 350 MG/ML SOLN COMPARISON:  CT abdomen pelvis 11/23/2018 FINDINGS: CTA CHEST FINDINGS Cardiovascular: Mild coronary artery calcification. Global cardiac size within normal limits. No pericardial effusion. Central pulmonary arteries are of normal caliber. The thoracic aorta is normal in course  and caliber. No intramural hematoma, dissection, or aneurysm. Arch vasculature demonstrates classic anatomic configuration and is widely patent proximally. Mediastinum/Nodes: There is shotty bilateral hilar, prevascular, and right paratracheal lymphadenopathy, possibly reactive in nature. No frankly pathologic thoracic adenopathy. Visualized thyroid is unremarkable. Esophagus is unremarkable. Lungs/Pleura: Moderate emphysema. Superimposed diffuse multifocal ground-glass pulmonary infiltrate is present demonstrating an upper lobe predominance, likely infectious or inflammatory in the acute setting. If chronic, this may reflect changes of hypersensitivity pneumonitis or smoking related lung disease. There is mild bronchial wall thickening in keeping with airway inflammation. There is a more focal 10 mm spiculated opacity within the right upper lobe at axial image # 53/5, which may represent more focal airspace infiltrate on a underlying pulmonary nodule. No pneumothorax or pleural effusion. No central obstructing mass. Musculoskeletal: No acute bone abnormality. No lytic or blastic bone lesion. Review of the MIP images confirms the above findings. CTA ABDOMEN AND PELVIS FINDINGS VASCULAR Aorta: Normal caliber aorta without aneurysm, dissection, vasculitis or significant stenosis. Mild atherosclerotic calcification Celiac: Patent without evidence of aneurysm, dissection, vasculitis or significant stenosis. SMA: Less than 50% stenosis of the superior mesenteric artery proximally. Distally, the vessel is widely patent. No aneurysm or dissection. Renals: Single left and dual right renal arteries are widely patent and demonstrate normal vascular morphology. No aneurysm or dissection. IMA: Patent without evidence of aneurysm, dissection, vasculitis or significant stenosis. Inflow: Patent without evidence of aneurysm, dissection, vasculitis or significant stenosis. Veins: No obvious venous abnormality within the limitations  of this arterial phase study. Review of the MIP images confirms the above findings. NON-VASCULAR Hepatobiliary: The gallbladder is distended. A a dependently layering stone is seen within the gallbladder lumen. A tiny calcified stone is seen within the origin of the cystic duct, best seen on axial image # 122/6. No pericholecystic inflammatory changes identified. 8 mm simple cyst within the right hepatic lobe. Liver otherwise unremarkable. No intra or extrahepatic biliary ductal dilation Pancreas: Unremarkable. No pancreatic ductal dilatation or surrounding inflammatory changes. Spleen: Unremarkable Adrenals/Urinary Tract: Adrenal glands are unremarkable. Kidneys are normal, without renal calculi, focal lesion, or hydronephrosis. Bladder is unremarkable. Stomach/Bowel: Stomach, small bowel,  and large bowel are unremarkable. Appendix is not clearly identified and is likely absent. No free intraperitoneal gas fluid. Lymphatic: No pathologic adenopathy within the abdomen and pelvis. Reproductive: Prostate is unremarkable. Other: No abdominal wall hernia. Musculoskeletal: Postsurgical changes of L4-S1 lumbar fusion with instrumentation and right L5 hemilaminectomy are identified. No acute bone abnormality. Review of the MIP images confirms the above findings. IMPRESSION: No evidence of thoracoabdominal aortic aneurysm or dissection. Mild coronary artery calcification. Multifocal pulmonary infiltrate. If acute, this may reflect changes of atypical infection. If chronic, additional considerations could include hypersensitivity pneumonitis or smoking related lung disease. Mild emphysema. Slightly spiculated opacity within the right upper lobe measuring 10 mm. Short-term follow-up imaging in 3 months is recommended to document stability or resolution and confirm or exclude the presence of an underlying focal pulmonary nodule. Cholelithiasis. Gallbladder distension with tiny calcified gallstone seen at the origin of the  cystic duct. Correlation with liver enzymes is recommended to exclude early changes of acute calculus cholecystitis. Aortic Atherosclerosis (ICD10-I70.0) and Emphysema (ICD10-J43.9). Electronically Signed   By: Fidela Salisbury M.D.   On: 12/16/2021 23:12     Assessment and Plan:   Chest pain / epigastric pain - hs troponin 23 --> 48 - pt denies chest pain - TTP in epigastric region - EKG nonischemic - do not think this represents ACS - can stop plavix - continue ASA and lipitor - needs to stop smoking   CAD DES-LAD 2017, POBA to negative pressure wire ISR in the proximal portion of the previously placed stent 11/2019 - continue ASA, plavix previously discontinued but still reflected in home medications - He is more than 12 months from his PCI and may discontinue Plavix, continue aspirin - no beta-blocker given bradycardia   Hyperlipidemia with LDL goal < 70 08/31/2021: Cholesterol, Total 92; HDL 33; LDL Chol Calc (NIH) 41; Triglycerides 91 Continue 80 mg lipitor   Shortness of breath COPD exacerbation Atypical PNA Treating with steroid taper, per primary Concerning lung nodule on CTA, will defer to pulmonology   Epigastric pain Vomiting - suspect GI etiology, ?viral    Risk Assessment/Risk Scores:     TIMI Risk Score for Unstable Angina or Non-ST Elevation MI:   The patient's TIMI risk score is 4, which indicates a 20% risk of all cause mortality, new or recurrent myocardial infarction or need for urgent revascularization in the next 14 days.       For questions or updates, please contact [place] Please consult [email or web address] for contact info under    Signed, Ledora Bottcher, PA  12/17/2021 11:40 AM  Personally seen and examined. Agree with above.  61 year old with known coronary disease prior DES to LAD in 2017 with heart catheterization in 2021 showing moderate in-stent restenosis with balloon angioplasty here with epigastric discomfort and nausea and  vomiting for 5 hours.  His troponin was elevated from 25-48.  EKG personally reviewed and interpreted showed no ischemic changes.  Sinus bradycardia.  Currently feeling well.  Has a lung nodule.  Decreased appetite.  Alert and oriented x3 normal respiratory effort, heart regular rate and rhythm no murmurs.  Assessment and plan:  Epigastric discomfort - Lipase is normal.  Possible gastritis.  Nausea and vomiting etc.  Further evaluation by gastroenterology.  Proton pump inhibitor.  On prednisone could be exacerbating.  Elevated troponin - Mildly elevated, secondary to myocardial injury in the setting of nausea vomiting, pneumonia.  No further cardiac work-up necessary.  No ischemic changes on ECG.  Explained  to them that if proximal LAD stent were closed, his troponin would be highly elevated.  No further cardiac work-up.  As a side note, I also take care of his mother.  No need for cardiology follow-up.  Candee Furbish, MD

## 2021-12-21 ENCOUNTER — Telehealth: Payer: Self-pay | Admitting: Nurse Practitioner

## 2021-12-21 ENCOUNTER — Telehealth: Payer: Self-pay

## 2021-12-21 ENCOUNTER — Other Ambulatory Visit: Payer: Self-pay | Admitting: Internal Medicine

## 2021-12-21 LAB — LIPOPROTEIN A (LPA): Lipoprotein (a): 27 nmol/L (ref <75.0)

## 2021-12-21 NOTE — Telephone Encounter (Signed)
Noted. Will await for paperwork to be distributed to PCP.

## 2021-12-21 NOTE — Telephone Encounter (Signed)
Copied from [place] [id number]. Topic: General - Other >> Dec 21, 2021  9:36 AM Alanda Slim E wrote: Reason for CRM: [facility] is faxing over some paper work that they need completed and faxed back to them / please advise

## 2021-12-21 NOTE — Telephone Encounter (Signed)
Transition Care Management Follow-up Telephone Call Date of discharge and from where: 12/17/2021, [facility] How have you been since you were released from the hospital? He said he is still hurting. He reports mid upper abdominal pain is concerned about his gall bladder and plans to return to Urgent Care or ED to address this issue. He said he has occasional nausea but no vomiting. Any questions or concerns? Yes- noted above  Items Reviewed: Did the pt receive and understand the discharge instructions provided? Yes  Medications obtained and verified? He said he has no medications and is waiting for all to be refilled and he plans to pick them up today.  Other? No  Any new allergies since your discharge? No  Dietary orders reviewed? Yes Do you have support at home? Yes   Home Care and Equipment/Supplies: Were home health services ordered? no If so, what is the name of the agency? N/a  Has the agency set up a time to come to the patient's home? not applicable Were any new equipment or medical supplies ordered?  No What is the name of the medical supply agency? N/a Were you able to get the supplies/equipment? not applicable Do you have any questions related to the use of the equipment or supplies? No  Functional Questionnaire: (I = Independent and D = Dependent) ADLs: independent.   Follow up appointments reviewed:  PCP Hospital f/u appt confirmed? Yes  Scheduled to see Geryl Rankins, NP- 01/03/2022  Specialist Hospital f/u appt confirmed? Yes  Scheduled to see pulmonary - 02/14/2022, GI- 05/09/2022.   Are transportation arrangements needed? No , his mother drives If their condition worsens, is the pt aware to call PCP or go to the Emergency Dept.? Yes Was the patient provided with contact information for the PCP's office or ED? Yes Was to pt encouraged to call back with questions or concerns? Yes

## 2022-01-03 ENCOUNTER — Encounter: Payer: Self-pay | Admitting: Nurse Practitioner

## 2022-01-03 ENCOUNTER — Ambulatory Visit: Payer: No Typology Code available for payment source | Attending: Nurse Practitioner | Admitting: Nurse Practitioner

## 2022-01-03 VITALS — BP 108/67 | HR 65 | Temp 98.3°F | Wt 217.0 lb

## 2022-01-03 DIAGNOSIS — F419 Anxiety disorder, unspecified: Secondary | ICD-10-CM | POA: Diagnosis not present

## 2022-01-03 MED ORDER — ASPIRIN 81 MG PO TBEC: 81.0000 mg | DELAYED_RELEASE_TABLET | Freq: Every day | ORAL | 12 refills | Status: DC

## 2022-01-03 MED ORDER — BUSPIRONE HCL 15 MG PO TABS: 15.0000 mg | ORAL_TABLET | Freq: Two times a day (BID) | ORAL | 3 refills | Status: DC
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
## 2022-01-07 DIAGNOSIS — L711 Rhinophyma: Secondary | ICD-10-CM | POA: Insufficient documentation

## 2022-01-07 DIAGNOSIS — J3489 Other specified disorders of nose and nasal sinuses: Secondary | ICD-10-CM | POA: Insufficient documentation

## 2022-01-10 ENCOUNTER — Ambulatory Visit (INDEPENDENT_AMBULATORY_CARE_PROVIDER_SITE_OTHER): Payer: Medicaid Other | Admitting: Student in an Organized Health Care Education/Training Program

## 2022-01-10 VITALS — BP 126/70 | HR 61 | Ht 75.0 in | Wt 214.0 lb

## 2022-01-10 DIAGNOSIS — F4310 Post-traumatic stress disorder, unspecified: Secondary | ICD-10-CM

## 2022-01-10 DIAGNOSIS — F250 Schizoaffective disorder, bipolar type: Secondary | ICD-10-CM | POA: Diagnosis not present

## 2022-01-10 DIAGNOSIS — F1990 Other psychoactive substance use, unspecified, uncomplicated: Secondary | ICD-10-CM

## 2022-01-10 MED ORDER — SERTRALINE HCL 25 MG PO TABS: 25.0000 mg | ORAL_TABLET | Freq: Every day | ORAL | 0 refills | Status: DC

## 2022-01-10 MED ORDER — DIVALPROEX SODIUM 250 MG PO DR TAB: 250.0000 mg | DELAYED_RELEASE_TABLET | Freq: Two times a day (BID) | ORAL | 2 refills | Status: DC

## 2022-01-10 MED ORDER — QUETIAPINE FUMARATE 200 MG PO TABS: 200.0000 mg | ORAL_TABLET | Freq: Every day | ORAL | 3 refills | Status: DC
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
## 2022-01-11 ENCOUNTER — Telehealth: Payer: Self-pay | Admitting: Nurse Practitioner

## 2022-01-11 NOTE — Telephone Encounter (Signed)
Copied from [place] [id number]. Topic: Referral - Status >> Jan 10, 2022  5:49 PM Sabas Sous wrote: Reason for CRM: Pt wants a call back to discuss his referral for [place] derm. Please advise  [phone]

## 2022-01-24 ENCOUNTER — Ambulatory Visit (INDEPENDENT_AMBULATORY_CARE_PROVIDER_SITE_OTHER): Payer: Medicaid Other | Admitting: Student in an Organized Health Care Education/Training Program

## 2022-01-24 ENCOUNTER — Encounter (HOSPITAL_COMMUNITY): Payer: Self-pay | Admitting: Student in an Organized Health Care Education/Training Program

## 2022-01-24 VITALS — BP 128/53 | HR 61 | Ht 75.0 in

## 2022-01-24 DIAGNOSIS — F4310 Post-traumatic stress disorder, unspecified: Secondary | ICD-10-CM

## 2022-01-24 DIAGNOSIS — F250 Schizoaffective disorder, bipolar type: Secondary | ICD-10-CM

## 2022-01-24 DIAGNOSIS — F1990 Other psychoactive substance use, unspecified, uncomplicated: Secondary | ICD-10-CM

## 2022-01-24 MED ORDER — SERTRALINE HCL 25 MG PO TABS: 25.0000 mg | ORAL_TABLET | Freq: Every day | ORAL | 0 refills | Status: DC
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
## 2022-02-09 ENCOUNTER — Telehealth: Payer: Self-pay | Admitting: Acute Care

## 2022-02-09 NOTE — Telephone Encounter (Signed)
In reviewing this patients' chart for [facility] referral, it was noted he had a CTA from an ED admission on 12/16/21.  Radiologist recommended a 3 month follow up CT Chest for an opacity noted in the right upper lung. The patient has an appt with Dr. Melvyn Novas on 02/14/22.  [facility] referral pending recommendation after OV with Dr. Melvyn Novas. There are no follow up CT orders pending at this time. Will route to Dr. Melvyn Novas as Juluis Rainier for upcoming OV.

## 2022-02-11 ENCOUNTER — Ambulatory Visit: Payer: Medicaid Other | Admitting: Nurse Practitioner

## 2022-02-11 ENCOUNTER — Other Ambulatory Visit: Payer: Self-pay

## 2022-02-11 DIAGNOSIS — J431 Panlobular emphysema: Secondary | ICD-10-CM

## 2022-02-11 DIAGNOSIS — F17210 Nicotine dependence, cigarettes, uncomplicated: Secondary | ICD-10-CM

## 2022-02-11 DIAGNOSIS — J449 Chronic obstructive pulmonary disease, unspecified: Secondary | ICD-10-CM

## 2022-02-14 ENCOUNTER — Emergency Department (HOSPITAL_COMMUNITY)
Admission: EM | Admit: 2022-02-14 | Discharge: 2022-02-14 | Disposition: A | Discharge status: Home / Self Care | Payer: Medicaid Other | Attending: Emergency Medicine | Admitting: Emergency Medicine

## 2022-02-14 ENCOUNTER — Encounter (HOSPITAL_COMMUNITY): Payer: Self-pay

## 2022-02-14 ENCOUNTER — Ambulatory Visit: Payer: Medicaid Other | Admitting: Internal Medicine

## 2022-02-14 ENCOUNTER — Emergency Department (HOSPITAL_COMMUNITY): Payer: Medicaid Other

## 2022-02-14 ENCOUNTER — Other Ambulatory Visit: Payer: Self-pay

## 2022-02-14 DIAGNOSIS — R001 Bradycardia, unspecified: Secondary | ICD-10-CM | POA: Insufficient documentation

## 2022-02-14 DIAGNOSIS — I2510 Atherosclerotic heart disease of native coronary artery without angina pectoris: Secondary | ICD-10-CM | POA: Diagnosis not present

## 2022-02-14 DIAGNOSIS — R1013 Epigastric pain: Secondary | ICD-10-CM

## 2022-02-14 DIAGNOSIS — R1010 Upper abdominal pain, unspecified: Secondary | ICD-10-CM | POA: Diagnosis present

## 2022-02-14 DIAGNOSIS — K8070 Calculus of gallbladder and bile duct without cholecystitis without obstruction: Secondary | ICD-10-CM | POA: Insufficient documentation

## 2022-02-14 DIAGNOSIS — Z7982 Long term (current) use of aspirin: Secondary | ICD-10-CM | POA: Insufficient documentation

## 2022-02-14 LAB — COMPREHENSIVE METABOLIC PANEL
ALT: 14 U/L (ref 0–44)
AST: 21 U/L (ref 15–41)
Albumin: 3.9 g/dL (ref 3.5–5.0)
Alkaline Phosphatase: 64 U/L (ref 38–126)
Anion gap: 10 (ref 5–15)
BUN: 11 mg/dL (ref 8–23)
CO2: 23 mmol/L (ref 22–32)
Calcium: 9.2 mg/dL (ref 8.9–10.3)
Chloride: 106 mmol/L (ref 98–111)
Creatinine, Ser: 0.94 mg/dL (ref 0.61–1.24)
GFR, Estimated: >60 mL/min (ref >60)
Glucose, Bld: 94 mg/dL (ref 70–99)
Potassium: 4.3 mmol/L (ref 3.5–5.1)
Sodium: 139 mmol/L (ref 135–145)
Total Bilirubin: 0.5 mg/dL (ref 0.3–1.2)
Total Protein: 7.1 g/dL (ref 6.5–8.1)

## 2022-02-14 LAB — CBC WITH DIFFERENTIAL/PLATELET
Abs Immature Granulocytes: 0.02 10*3/uL (ref 0.00–0.07)
Basophils Absolute: 0.1 10*3/uL (ref 0.0–0.1)
Basophils Relative: 1 %
Eosinophils Absolute: 0.4 10*3/uL (ref 0.0–0.5)
Eosinophils Relative: 4 %
HCT: 47.1 % (ref 39.0–52.0)
Hemoglobin: 15.7 g/dL (ref 13.0–17.0)
Immature Granulocytes: 0 %
Lymphocytes Relative: 25 %
Lymphs Abs: 2.2 10*3/uL (ref 0.7–4.0)
MCH: 31.7 pg (ref 26.0–34.0)
MCHC: 33.3 g/dL (ref 30.0–36.0)
MCV: 95 fL (ref 80.0–100.0)
Monocytes Absolute: 0.4 10*3/uL (ref 0.1–1.0)
Monocytes Relative: 5 %
Neutro Abs: 5.5 10*3/uL (ref 1.7–7.7)
Neutrophils Relative %: 65 %
Platelets: 251 10*3/uL (ref 150–400)
RBC: 4.96 MIL/uL (ref 4.22–5.81)
RDW: 13.2 % (ref 11.5–15.5)
WBC: 8.6 10*3/uL (ref 4.0–10.5)
nRBC: 0 % (ref 0.0–0.2)

## 2022-02-14 LAB — LIPASE, BLOOD: Lipase: 32 U/L (ref 11–51)

## 2022-02-14 MED ORDER — ONDANSETRON HCL 4 MG/2ML IJ SOLN
4.0000 mg | Freq: Once | INTRAMUSCULAR | Status: AC
  Administered 2022-02-14: 4 mg via INTRAVENOUS
  Filled 2022-02-14: qty 2

## 2022-02-14 MED ORDER — SODIUM CHLORIDE 0.9 % IV BOLUS (SEPSIS)
1000.0000 mL | Freq: Once | INTRAVENOUS | Status: AC
  Administered 2022-02-14: 1000 mL via INTRAVENOUS

## 2022-02-14 MED ORDER — SODIUM CHLORIDE 0.9 % IV SOLN
1000.0000 mL | INTRAVENOUS | Status: DC
  Administered 2022-02-14: 1000 mL via INTRAVENOUS

## 2022-02-14 MED ORDER — BUPRENORPHINE HCL-NALOXONE HCL 8-2 MG SL SUBL
1.0000 | SUBLINGUAL_TABLET | Freq: Once | SUBLINGUAL | Status: AC
  Administered 2022-02-14: 1 via SUBLINGUAL
  Filled 2022-02-14: qty 1

## 2022-02-14 MED ORDER — HYDROMORPHONE HCL 1 MG/ML IJ SOLN
1.0000 mg | Freq: Once | INTRAMUSCULAR | Status: AC
  Administered 2022-02-14: 1 mg via INTRAVENOUS
  Filled 2022-02-14: qty 1

## 2022-02-14 NOTE — Discharge Instructions (Addendum)
Avoid eating fatty foods or fried foods.  Please review the discharge instructions appropriate dietary restrictions.  Follow-up with the GI doctor as well as the general surgery doctor for further evaluation.

## 2022-02-14 NOTE — ED Provider Notes (Signed)
[facility] Provider Note   CSN: [id number] Arrival date & time: 02/14/22  0825     History  Chief Complaint  Patient presents with   Abdominal Pain    Keith Gibbs is a 61 y.o. male.   Abdominal Pain  Patient has a history of chronic back pain, chronic hip pain, chronic pain syndrome on chronic opiates, cocaine abuse, marijuana abuse, coronary artery disease who presents with complaints of upper abdominal pain.  Patient states the symptoms started last night early in the morning.  He had had some pizza for dinner prior to that.  Patient states he started having pain primarily in the upper abdomen and has had multiple episodes of vomiting.  Patient feels like it is his gallbladder.  Patient does admit to running out of his Suboxone because he has had to take it more recently.  His last dose was yesterday morning and he should have had a dose last evening.  Patient states he was in the hospital in May for this issue.  He was told it was his gallbladder.      Home Medications Prior to Admission medications   Medication Sig Start Date End Date Taking? Authorizing Provider  acetaminophen (TYLENOL) 500 MG tablet Take 2 tablets (1,000 mg total) by mouth every 6 (six) hours. Patient taking differently: Take 1,000 mg by mouth daily as needed for headache. 01/20/21   Duanne Guess, PA-C  albuterol (VENTOLIN HFA) 108 (90 Base) MCG/ACT inhaler Inhale 2 puffs into the lungs every 6 (six) hours as needed for wheezing or shortness of breath. 05/28/19   Charlott Rakes, MD  aspirin EC 81 MG tablet Take 1 tablet (81 mg total) by mouth daily. Swallow whole. 01/03/22   Gildardo Pounds, NP  atorvastatin (LIPITOR) 80 MG tablet Take 80 mg by mouth daily. 08/28/20   [provider]  azelastine (ASTELIN) 0.1 % nasal spray Place 1 spray into both nostrils 2 (two) times daily. Use in each nostril as directed    [provider]   Budeson-Glycopyrrol-Formoterol (BREZTRI AEROSPHERE) 160-9-4.8 MCG/ACT AERO Take 2 puffs first thing in am and then another 2 puffs about 12 hours later. 12/14/21   Tanda Rockers, MD  busPIRone (BUSPAR) 15 MG tablet Take 1 tablet (15 mg total) by mouth 2 (two) times daily. 01/03/22   Gildardo Pounds, NP  cetirizine (ZYRTEC) 10 MG tablet TAKE 1 TABLET BY MOUTH EVERY DAY Patient taking differently: Take 10 mg by mouth daily. 02/02/21   Bacigalupo, Dionne Bucy, MD  cyclobenzaprine (FLEXERIL) 10 MG tablet TAKE 1 TABLET BY MOUTH THREE TIMES A DAY Patient taking differently: Take 10 mg by mouth daily as needed for muscle spasms. 09/21/21   Gwyneth Sprout, FNP  diclofenac (VOLTAREN) 50 MG EC tablet Take 50 mg by mouth 2 (two) times daily.    [provider]  divalproex (DEPAKOTE) 250 MG DR tablet Take 1 tablet (250 mg total) by mouth 2 (two) times daily. 01/10/22 01/10/23  Freida Busman, MD  fluticasone (FLONASE) 50 MCG/ACT nasal spray Place 1 spray into both nostrils at bedtime.    [provider]  mometasone-formoterol (DULERA) 200-5 MCG/ACT AERO Inhale 2 puffs into the lungs 2 (two) times daily.    [provider]  Multiple Vitamin (MULTIVITAMIN) tablet Take 1 tablet by mouth daily.    [provider]  nitroGLYCERIN (NITROSTAT) 0.3 MG SL tablet Place 0.3 mg under the tongue every 5 (five) minutes as  needed for chest pain. 12/01/20   [provider]  Omega-3 Fatty Acids (FISH OIL) 1200 MG CAPS Take 1,200 mg by mouth daily.    [provider]  omeprazole (PRILOSEC) 40 MG capsule TAKE 1 CAPSULE (40 MG TOTAL) BY MOUTH DAILY. Patient taking differently: Take 40 mg by mouth daily. 06/08/21   Virginia Crews, MD  QUEtiapine (SEROQUEL) 200 MG tablet Take 1 tablet (200 mg total) by mouth at bedtime. 01/10/22 01/10/23  Freida Busman, MD  rOPINIRole (REQUIP) 2 MG tablet TAKE 1 TABLET BY MOUTH EVERYDAY AT BEDTIME Patient taking differently: Take 2 mg by mouth  at bedtime. 10/26/21   Virginia Crews, MD  sertraline (ZOLOFT) 25 MG tablet Take 1 tablet (25 mg total) by mouth daily. 01/24/22 01/24/23  Freida Busman, MD  SUBOXONE 8-2 MG FILM Place 1 Film under the tongue 2 (two) times daily. 08/27/21   [provider]  tamsulosin (FLOMAX) 0.4 MG CAPS capsule TAKE 1 CAPSULE BY MOUTH EVERY DAY Patient taking differently: Take 0.4 mg by mouth daily. 06/08/21   Virginia Crews, MD      Allergies    Anette Guarneri [lurasidone hcl] and Saphris [asenapine]    Review of Systems   Review of Systems  Gastrointestinal:  Positive for abdominal pain.    Physical Exam Updated Vital Signs BP 107/65   Pulse (!) 52   Temp 98.1 F (36.7 C) (Oral)   Resp 10   SpO2 100%  Physical Exam Vitals and nursing note reviewed.  Constitutional:      Appearance: He is well-developed. He is ill-appearing.  HENT:     Head: Normocephalic and atraumatic.     Right Ear: External ear normal.     Left Ear: External ear normal.  Eyes:     General: No scleral icterus.       Right eye: No discharge.        Left eye: No discharge.     Conjunctiva/sclera: Conjunctivae normal.  Neck:     Trachea: No tracheal deviation.  Cardiovascular:     Rate and Rhythm: Regular rhythm. Bradycardia present.  Pulmonary:     Effort: Pulmonary effort is normal. No respiratory distress.     Breath sounds: Normal breath sounds. No stridor. No wheezing or rales.  Abdominal:     General: Bowel sounds are normal. There is no distension.     Palpations: Abdomen is soft.     Tenderness: There is abdominal tenderness in the right upper quadrant and epigastric area. There is no guarding or rebound.  Musculoskeletal:        General: No tenderness or deformity.     Cervical back: Neck supple.  Skin:    General: Skin is warm and dry.     Findings: No rash.  Neurological:     General: No focal deficit present.     Mental Status: He is alert.     Cranial Nerves: No cranial nerve deficit (no  facial droop, extraocular movements intact, no slurred speech).     Sensory: No sensory deficit.     Motor: No abnormal muscle tone or seizure activity.     Coordination: Coordination normal.  Psychiatric:        Mood and Affect: Mood normal.     ED Results / Procedures / Treatments   Labs (all labs ordered are listed, but only abnormal results are displayed) Labs Reviewed  COMPREHENSIVE METABOLIC PANEL  LIPASE, BLOOD  CBC WITH DIFFERENTIAL/PLATELET  URINALYSIS,  ROUTINE W REFLEX MICROSCOPIC    EKG EKG Interpretation  Date/Time:  Monday February 14 2022 08:31:06 EDT Ventricular Rate:  45 PR Interval:  193 QRS Duration: 113 QT Interval:  508 QTC Calculation: 440 R Axis:   71 Text Interpretation: Sinus bradycardia Atrial premature complex Borderline intraventricular conduction delay No significant change since last tracing Confirmed by Dorie Rank [phone]) on 02/14/2022 9:46:50 AM  Radiology US Abdomen Limited RUQ (LIVER/GB)  Result Date: 02/14/2022 CLINICAL DATA:  Upper abdominal pain EXAM: ULTRASOUND ABDOMEN LIMITED RIGHT UPPER QUADRANT COMPARISON:  CT abdomen 12/16/2021 FINDINGS: Gallbladder: Cholelithiasis measuring up to 10 mm. No pericholecystic fluid or gallbladder wall thickening. Negative sonographic Murphy sign. Common bile duct: Diameter: 3 mm Liver: No focal lesion identified. Slight nodularity along the liver surface. Within normal limits in parenchymal echogenicity. Portal vein is patent on color Doppler imaging with normal direction of blood flow towards the liver. Other: None. IMPRESSION: 1. Cholelithiasis without sonographic evidence of acute cholecystitis. 2. Slight nodularity along the liver surface as can be seen with hepatocellular disease. Correlate with liver function test. No focal hepatic mass. Electronically Signed   By: Kathreen Devoid M.D.   On: 02/14/2022 10:52    Procedures Procedures    Medications Ordered in ED Medications  sodium chloride 0.9 % bolus  1,000 mL (0 mLs Intravenous Stopped 02/14/22 1039)    Followed by  0.9 %  sodium chloride infusion (1,000 mLs Intravenous New Bag/Given 02/14/22 1042)  buprenorphine-naloxone (SUBOXONE) 8-2 mg per SL tablet 1 tablet (1 tablet Sublingual Given 02/14/22 0922)  ondansetron (ZOFRAN) injection 4 mg (4 mg Intravenous Given 02/14/22 0922)  HYDROmorphone (DILAUDID) injection 1 mg (1 mg Intravenous Given 02/14/22 1039)    ED Course/ Medical Decision Making/ A&P Clinical Course as of 02/14/22 1236  Mon Feb 14, 2022  1031 CBC with Diff Normal [JK]  1031 Comprehensive metabolic panel Normal  [JK]  1031 Lipase, blood Lipase normal [JK]  1117 US Abdomen Limited RUQ (LIVER/GB) Ultrasound shows evidence of gallstones without findings to suggest cholecystitis [JK]  1143 Repeat exam abdomen is nontender , no guarding. [UE]  4540 Notes from recent hospitalization reviewed.  Patient did see gastroenterology.  They plan on doing an EGD and then a HIDA scan if the EGD was negative.  Patient decided to leave before that was completed. [JK]    Clinical Course User Index [JK] Dorie Rank, MD                           Medical Decision Making Differential includes pancreatitis hepatitis cholecystitis  Problems Addressed: Epigastric pain: acute illness or injury that poses a threat to life or bodily functions  Amount and/or Complexity of Data Reviewed External Data Reviewed: radiology and notes.    Details: CT scan report reviewed from Dec 16, 2021.  Patient noted to have distended gallbladder with a gallstone.  No inflammatory changes noted Labs: ordered. Decision-making details documented in ED Course. Radiology: ordered. Decision-making details documented in ED Course. Discussion of management or test interpretation with external provider(s): Case discussed with general surgery.  We will have patient follow-up as an outpatient.  Previous hospitalization was also reviewed and the patient was supposed to  follow-up with GI for possible endoscopy and possible HIDA scan.  We will have patient follow-up with GI as well as general surgery  Risk Prescription drug management.   Patient presented with recurrent abdominal pain.  Similar episode to previous event where he  was hospitalized for possible cardiac etiology.  Patient had focal tenderness in upper abdomen.  Consider the possibility of acute cholecystitis.  Ultrasound confirms cholelithiasis but no findings to suggest cholecystitis.  Patient does not have a fever or leukocytosis.  His pain improved with treatment.  No tenderness on repeat exam.  Discussed case with general surgery and have him follow-up as an outpatient.  Patient was also instructed to follow-up with GI as previously recommended during his last hospitalization.  Evaluation and diagnostic testing in the emergency department does not suggest an emergent condition requiring admission or immediate intervention beyond what has been performed at this time.  The patient is safe for discharge and has been instructed to return immediately for worsening symptoms, change in symptoms or any other concerns.        Final Clinical Impression(s) / ED Diagnoses Final diagnoses:  Epigastric pain    Rx / DC Orders ED Discharge Orders     None         Dorie Rank, MD 02/14/22 1236

## 2022-02-14 NOTE — Progress Notes (Deleted)
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 [place] dx copd some better with "inhalers"      History of Present Illness  07/21/2020  Pulmonary/ 1st office eval/ Tishie Altmann / [name] / maint on dulera/ active smoker  Chief Complaint  Patient presents with   Consult    Productive cough with yellow/brown phlegm since July 2021, shortness of breath at rest and activity  Dyspnea:  [employer] slow pace ok on dulera 8 am and w/in a few hours used saba  Cough: cough x July 21 as above, wore in am's Sleep: flat bed 2 pillows / inhaler once a week SABA use: too much p activity, rarely ever at rest 02 none rec Plan A = Automatic = Always=    Breztri Take 2 puffs first thing in am and then another 2 puffs about 12 hours later.  Work on inhaler technique: Plan B = Backup (to supplement plan A, not to replace it) Only use your albuterol inhaler as a rescue medication Zpak Prednisone 10 mg take  4 each am x 2 days,   2 each am x 2 days,  1 each am x 2 days and stop  08/06/20 PC  pred x 6 days for flare    08/21/2020  f/u ov/[place] office/Misako Roeder re: doe Chief Complaint  Patient presents with   Follow-up    Shortness of breath with activity  Dyspnea:  Improved over baseline and always better off pred Cough: min rattling in am / mucoid and better off cigs Sleeping: ok  SABA use: once a day at more  02: none  Rec Plan A = Automatic = Always=    breztri Take 2 puffs first thing in am and then another 2 puffs about 12 hours later.  Plan B = Backup (to supplement plan A, not to replace it) Only use your albuterol inhaler as a rescue medication  Plan C = Crisis - breathing getting worse despite A and B > Prednisone 10 mg take  4 each am x 2 days,   2 each am x 2 days,  1 each am  x 2 days and stop      12/14/2021  f/u ov/Jacia Sickman re: AB   maint on  ? (Not sure of meds)  Chief Complaint  Patient presents with   Follow-up    Had quit smoking, but started cheating again smoking 5 cigarettes per day.  Using patches/gum.  Dyspnea:  walks store from house to store once a week which is mostly flat  Cough: rattling esp  in am/ slt yellow mucus   Sleeping: able to sleep can hear himself  SABA use: not sure which inhalers he's using at this point  02: none  Covid status:   vax x 3   Rec Zpak  Prednisone 10 mg take  4 each am x 2 days,   2 each am x 2 days,  1 each am x 2 days and stop  Plan A = Automatic = Always=    Breztri Take 2 puffs first thing in am and then another 2 puffs about 12 hours later.  Work on inhaler technique:   Plan B = Backup (to supplement plan A, not to replace it) Only use  your albuterol inhaler as a rescue medication   The key is to stop smoking completely before smoking completely stops you!     Please schedule a follow up office visit in 6 weeks, call sooner if needed PFTs  and bring your medications with you     02/14/2022  f/u ov/Iver Fehrenbach re: AB   maint on ***  No chief complaint on file.   Dyspnea:  *** Cough: *** Sleeping: *** SABA use: *** 02: ***     No obvious day to day or daytime variability or assoc excess/ purulent sputum or mucus plugs or hemoptysis or cp or chest tightness, subjective wheeze or overt sinus or hb symptoms.   *** without nocturnal  or early am exacerbation  of respiratory  c/o's or need for noct saba. Also denies any obvious fluctuation of symptoms with weather or environmental changes or other aggravating or alleviating factors except as outlined above   No unusual exposure hx or h/o childhood pna/ asthma or knowledge of premature birth.  Current Allergies, Complete Past Medical History, Past Surgical History, Family History, and Social History were reviewed in [employer]  record.  ROS  The following are not active complaints unless bolded Hoarseness, sore throat, dysphagia, dental problems, itching, sneezing,  nasal congestion or discharge of excess mucus or purulent secretions, ear ache,   fever, chills, sweats, unintended wt loss or wt gain, classically pleuritic or exertional cp,  orthopnea pnd or arm/hand swelling  or leg swelling, presyncope, palpitations, abdominal pain, anorexia, nausea, vomiting, diarrhea  or change in bowel habits or change in bladder habits, change in stools or change in urine, dysuria, hematuria,  rash, arthralgias, visual complaints, headache, numbness, weakness or ataxia or problems with walking or coordination,  change in mood or  memory.        No outpatient medications have been marked as taking for the 02/14/22 encounter (Appointment) with Tanda Rockers, MD.           Past Medical History:  Diagnosis Date   Anxiety    Arthritis    Bursitis    Chronic pain    COPD (chronic obstructive pulmonary disease) ([place])    Depression    GERD (gastroesophageal reflux disease)    Hepatitis C 2012   No longer has Hep C   Hypertension    Lung disease    MI (myocardial infarction) ([place])    Stroke ([place])    Substance abuse ([place])               Objective:    Wts  02/14/2022       ***  12/14/2021       214   08/21/20 219 lb (99.3 kg)  07/21/20 208 lb 12.8 oz (94.7 kg)  02/13/20 220 lb 7.4 oz (100 kg)    Vital signs reviewed  02/14/2022  - Note at rest 02 sats  ***% on ***   General appearance:    ***     Mod bar ***                 Assessment

## 2022-02-14 NOTE — ED Triage Notes (Signed)
Pt BIB EMS for central upper abdominal pain x4 hours. Pt was hospitalized for "gallbladder problems " about a month and a hlaf ago and left AMA without resolution. Pt has had multiple episode since then. Pt dry heaving with EMS    172/100 60 HR  22 RR  99% RA  CBG 100

## 2022-02-15 ENCOUNTER — Ambulatory Visit: Payer: Medicaid Other | Admitting: Nurse Practitioner

## 2022-02-15 ENCOUNTER — Encounter (HOSPITAL_COMMUNITY): Payer: Medicaid Other | Admitting: Student in an Organized Health Care Education/Training Program

## 2022-02-16 ENCOUNTER — Observation Stay (HOSPITAL_COMMUNITY)
Admission: EM | Admit: 2022-02-16 | Discharge: 2022-02-18 | Disposition: A | Discharge status: Home / Self Care | Payer: Medicaid Other | Attending: Emergency Medicine | Admitting: Emergency Medicine

## 2022-02-16 ENCOUNTER — Other Ambulatory Visit: Payer: Self-pay

## 2022-02-16 DIAGNOSIS — F17210 Nicotine dependence, cigarettes, uncomplicated: Secondary | ICD-10-CM | POA: Insufficient documentation

## 2022-02-16 DIAGNOSIS — Z96651 Presence of right artificial knee joint: Secondary | ICD-10-CM | POA: Diagnosis not present

## 2022-02-16 DIAGNOSIS — Z79899 Other long term (current) drug therapy: Secondary | ICD-10-CM | POA: Diagnosis not present

## 2022-02-16 DIAGNOSIS — K219 Gastro-esophageal reflux disease without esophagitis: Secondary | ICD-10-CM | POA: Diagnosis not present

## 2022-02-16 DIAGNOSIS — Z7982 Long term (current) use of aspirin: Secondary | ICD-10-CM | POA: Diagnosis not present

## 2022-02-16 DIAGNOSIS — K8010 Calculus of gallbladder with chronic cholecystitis without obstruction: Principal | ICD-10-CM | POA: Insufficient documentation

## 2022-02-16 DIAGNOSIS — I10 Essential (primary) hypertension: Secondary | ICD-10-CM | POA: Insufficient documentation

## 2022-02-16 DIAGNOSIS — K8020 Calculus of gallbladder without cholecystitis without obstruction: Secondary | ICD-10-CM | POA: Diagnosis not present

## 2022-02-16 DIAGNOSIS — M545 Low back pain, unspecified: Secondary | ICD-10-CM

## 2022-02-16 DIAGNOSIS — J449 Chronic obstructive pulmonary disease, unspecified: Secondary | ICD-10-CM | POA: Diagnosis not present

## 2022-02-16 DIAGNOSIS — Z955 Presence of coronary angioplasty implant and graft: Secondary | ICD-10-CM | POA: Diagnosis not present

## 2022-02-16 DIAGNOSIS — I2510 Atherosclerotic heart disease of native coronary artery without angina pectoris: Secondary | ICD-10-CM | POA: Insufficient documentation

## 2022-02-16 DIAGNOSIS — K810 Acute cholecystitis: Secondary | ICD-10-CM | POA: Diagnosis present

## 2022-02-16 DIAGNOSIS — I252 Old myocardial infarction: Secondary | ICD-10-CM | POA: Diagnosis not present

## 2022-02-16 DIAGNOSIS — Z8673 Personal history of transient ischemic attack (TIA), and cerebral infarction without residual deficits: Secondary | ICD-10-CM | POA: Diagnosis not present

## 2022-02-16 DIAGNOSIS — R1011 Right upper quadrant pain: Secondary | ICD-10-CM | POA: Diagnosis present

## 2022-02-16 MED ORDER — OXYCODONE-ACETAMINOPHEN 5-325 MG PO TABS
1.0000 | ORAL_TABLET | Freq: Once | ORAL | Status: AC
  Administered 2022-02-17: 1 via ORAL
  Filled 2022-02-16: qty 1

## 2022-02-16 NOTE — ED Provider Triage Note (Signed)
Emergency Medicine Provider Triage Evaluation Note  Keith Gibbs , a 61 y.o. male  was evaluated in triage.  Pt complains of RUQ and epigastric pain the last 72 hours worse in the last 12 hours, radiates to the back.  History of cholelithiasis and chronic pain syndrome previously on chronic narcotics, on Suboxone but states that they discontinued his Suboxone today.  Was evaluated by surgeon in the office today per patient but has been asked to see pulmonologist prior to surgical planning for cholecystectomy.  History of CAD, polysubstance abuse.   Review of Systems  Positive: Abdominal pain, nausea, vomiting with NBNB emesis Negative: Diarrhea, fevers, chills, syncope, chest pain, shortness of breath  Physical Exam  BP (!) 159/94 (BP Location: Right Arm)   Pulse (!) 55   Temp 98.4 F (36.9 C) (Oral)   Resp 19   SpO2 98%  Gen:   Awake, visibly uncomfortable, tearful, rolling around the bed in triage when the providers in the room Resp:  Normal effort  MSK:   Moves extremities without difficulty  Other:  Lungs CTA B, RRR no M/R history.  Epigastric and right upper quadrant tenderness palpation without Murphy sign.  Medical Decision Making  Medically screening exam initiated at 11:40 PM.  Appropriate orders placed.  Keith Gibbs was informed that the remainder of the evaluation will be completed by another provider, this initial triage assessment does not replace that evaluation, and the importance of remaining in the ED until their evaluation is complete.  This chart was dictated using voice recognition software, Dragon. Despite the best efforts of this provider to proofread and correct errors, errors may still occur which can change documentation meaning.    Emeline Darling, PA-C 02/16/22 2342

## 2022-02-16 NOTE — ED Triage Notes (Signed)
Pt BIB [facility] from home mid abdominal pain similar to gallstone in the past. Abd pain associated with nv headache and pain radiates left back x12 hours. EMS IV 22G L forearm was given 4 zofran

## 2022-02-17 ENCOUNTER — Emergency Department (HOSPITAL_COMMUNITY): Payer: Medicaid Other

## 2022-02-17 ENCOUNTER — Encounter (HOSPITAL_COMMUNITY): Payer: Self-pay

## 2022-02-17 ENCOUNTER — Encounter (HOSPITAL_COMMUNITY)
Admission: EM | Disposition: A | Discharge status: Home / Self Care | Payer: Self-pay | Source: Home / Self Care | Attending: Emergency Medicine

## 2022-02-17 ENCOUNTER — Emergency Department (HOSPITAL_COMMUNITY): Payer: Medicaid Other | Admitting: Certified Registered"

## 2022-02-17 ENCOUNTER — Emergency Department (EMERGENCY_DEPARTMENT_HOSPITAL): Payer: Medicaid Other | Admitting: Certified Registered"

## 2022-02-17 ENCOUNTER — Other Ambulatory Visit: Payer: Self-pay

## 2022-02-17 DIAGNOSIS — K810 Acute cholecystitis: Secondary | ICD-10-CM | POA: Diagnosis present

## 2022-02-17 DIAGNOSIS — I252 Old myocardial infarction: Secondary | ICD-10-CM

## 2022-02-17 DIAGNOSIS — J449 Chronic obstructive pulmonary disease, unspecified: Secondary | ICD-10-CM

## 2022-02-17 DIAGNOSIS — I10 Essential (primary) hypertension: Secondary | ICD-10-CM

## 2022-02-17 DIAGNOSIS — K8020 Calculus of gallbladder without cholecystitis without obstruction: Secondary | ICD-10-CM

## 2022-02-17 DIAGNOSIS — I2510 Atherosclerotic heart disease of native coronary artery without angina pectoris: Secondary | ICD-10-CM

## 2022-02-17 HISTORY — PX: CHOLECYSTECTOMY: SHX55

## 2022-02-17 LAB — COMPREHENSIVE METABOLIC PANEL
ALT: 17 U/L (ref 0–44)
AST: 20 U/L (ref 15–41)
Albumin: 4 g/dL (ref 3.5–5.0)
Alkaline Phosphatase: 64 U/L (ref 38–126)
Anion gap: 9 (ref 5–15)
BUN: 12 mg/dL (ref 8–23)
CO2: 25 mmol/L (ref 22–32)
Calcium: 9.4 mg/dL (ref 8.9–10.3)
Chloride: 100 mmol/L (ref 98–111)
Creatinine, Ser: 0.91 mg/dL (ref 0.61–1.24)
GFR, Estimated: >60 mL/min (ref >60)
Glucose, Bld: 108 mg/dL — ABNORMAL HIGH (ref 70–99)
Potassium: 3.8 mmol/L (ref 3.5–5.1)
Sodium: 134 mmol/L — ABNORMAL LOW (ref 135–145)
Total Bilirubin: 0.8 mg/dL (ref 0.3–1.2)
Total Protein: 7.5 g/dL (ref 6.5–8.1)

## 2022-02-17 LAB — CBC WITH DIFFERENTIAL/PLATELET
Abs Immature Granulocytes: 0.03 10*3/uL (ref 0.00–0.07)
Basophils Absolute: 0 10*3/uL (ref 0.0–0.1)
Basophils Relative: 0 %
Eosinophils Absolute: 0.1 10*3/uL (ref 0.0–0.5)
Eosinophils Relative: 1 %
HCT: 42.4 % (ref 39.0–52.0)
Hemoglobin: 14.7 g/dL (ref 13.0–17.0)
Immature Granulocytes: 0 %
Lymphocytes Relative: 18 %
Lymphs Abs: 1.7 10*3/uL (ref 0.7–4.0)
MCH: 31.5 pg (ref 26.0–34.0)
MCHC: 34.7 g/dL (ref 30.0–36.0)
MCV: 90.8 fL (ref 80.0–100.0)
Monocytes Absolute: 0.4 10*3/uL (ref 0.1–1.0)
Monocytes Relative: 5 %
Neutro Abs: 7.3 10*3/uL (ref 1.7–7.7)
Neutrophils Relative %: 76 %
Platelets: 275 10*3/uL (ref 150–400)
RBC: 4.67 MIL/uL (ref 4.22–5.81)
RDW: 13.2 % (ref 11.5–15.5)
WBC: 9.6 10*3/uL (ref 4.0–10.5)
nRBC: 0 % (ref 0.0–0.2)

## 2022-02-17 LAB — I-STAT ARTERIAL BLOOD GAS, ED
Acid-base deficit: 1 mmol/L (ref 0.0–2.0)
Bicarbonate: 20.8 mmol/L (ref 20.0–28.0)
Calcium, Ion: 1.2 mmol/L (ref 1.15–1.40)
HCT: 41 % (ref 39.0–52.0)
Hemoglobin: 13.9 g/dL (ref 13.0–17.0)
O2 Saturation: 99 %
Patient temperature: 98.3
Potassium: 3.6 mmol/L (ref 3.5–5.1)
Sodium: 135 mmol/L (ref 135–145)
TCO2: 22 mmol/L (ref 22–32)
pCO2 arterial: 26.7 mmHg — ABNORMAL LOW (ref 32–48)
pH, Arterial: 7.499 — ABNORMAL HIGH (ref 7.35–7.45)
pO2, Arterial: 105 mmHg (ref 83–108)

## 2022-02-17 LAB — LIPASE, BLOOD: Lipase: 31 U/L (ref 11–51)

## 2022-02-17 LAB — TROPONIN I (HIGH SENSITIVITY)
Troponin I (High Sensitivity): 20 ng/L — ABNORMAL HIGH (ref <18)
Troponin I (High Sensitivity): 29 ng/L — ABNORMAL HIGH (ref <18)

## 2022-02-17 SURGERY — LAPAROSCOPIC CHOLECYSTECTOMY
Anesthesia: General

## 2022-02-17 MED ORDER — MIDAZOLAM HCL 2 MG/2ML IJ SOLN
INTRAMUSCULAR | Status: AC
  Filled 2022-02-17: qty 2

## 2022-02-17 MED ORDER — ENOXAPARIN SODIUM 40 MG/0.4ML IJ SOSY
40.0000 mg | PREFILLED_SYRINGE | Freq: Every day | INTRAMUSCULAR | Status: DC
Start: 2022-02-18 — End: 2022-02-18
  Administered 2022-02-18: 40 mg via SUBCUTANEOUS
  Filled 2022-02-17: qty 0.4

## 2022-02-17 MED ORDER — PROPOFOL 10 MG/ML IV BOLUS
INTRAVENOUS | Status: AC
Start: 2022-02-17 — End: ?
  Filled 2022-02-17: qty 20

## 2022-02-17 MED ORDER — DIPHENHYDRAMINE HCL 25 MG PO CAPS: 25.0000 mg | ORAL_CAPSULE | Freq: Four times a day (QID) | ORAL | Status: DC | PRN

## 2022-02-17 MED ORDER — CHLORHEXIDINE GLUCONATE 0.12 % MT SOLN: 15.0000 mL | Freq: Once | OROMUCOSAL | Status: AC

## 2022-02-17 MED ORDER — MEPERIDINE HCL 25 MG/ML IJ SOLN: 6.2500 mg | INTRAMUSCULAR | Status: DC | PRN

## 2022-02-17 MED ORDER — ONDANSETRON HCL 4 MG/2ML IJ SOLN
INTRAMUSCULAR | Status: DC | PRN
  Administered 2022-02-17: 4 mg via INTRAVENOUS

## 2022-02-17 MED ORDER — MIDAZOLAM HCL 2 MG/2ML IJ SOLN
INTRAMUSCULAR | Status: DC | PRN
  Administered 2022-02-17: 2 mg via INTRAVENOUS

## 2022-02-17 MED ORDER — HYDROMORPHONE HCL 1 MG/ML IJ SOLN
INTRAMUSCULAR | Status: AC
  Filled 2022-02-17: qty 1

## 2022-02-17 MED ORDER — LACTATED RINGERS IV SOLN: INTRAVENOUS | Status: DC

## 2022-02-17 MED ORDER — ONDANSETRON HCL 4 MG/2ML IJ SOLN
4.0000 mg | Freq: Once | INTRAMUSCULAR | Status: AC
  Administered 2022-02-17: 4 mg via INTRAVENOUS
  Filled 2022-02-17: qty 2

## 2022-02-17 MED ORDER — KETOROLAC TROMETHAMINE 30 MG/ML IJ SOLN
30.0000 mg | Freq: Once | INTRAMUSCULAR | Status: AC
  Administered 2022-02-17: 30 mg via INTRAVENOUS

## 2022-02-17 MED ORDER — HYDRALAZINE HCL 20 MG/ML IJ SOLN: 10.0000 mg | INTRAMUSCULAR | Status: DC | PRN

## 2022-02-17 MED ORDER — HYDROMORPHONE HCL 1 MG/ML IJ SOLN
1.0000 mg | Freq: Once | INTRAMUSCULAR | Status: AC
  Administered 2022-02-17: 1 mg via INTRAVENOUS
  Filled 2022-02-17: qty 1

## 2022-02-17 MED ORDER — PANTOPRAZOLE SODIUM 40 MG PO TBEC
40.0000 mg | DELAYED_RELEASE_TABLET | Freq: Every day | ORAL | Status: DC
  Administered 2022-02-17 – 2022-02-18 (×2): 40 mg via ORAL
  Filled 2022-02-17 (×2): qty 1

## 2022-02-17 MED ORDER — DIVALPROEX SODIUM 250 MG PO DR TAB
250.0000 mg | DELAYED_RELEASE_TABLET | Freq: Two times a day (BID) | ORAL | Status: DC
  Administered 2022-02-17 – 2022-02-18 (×2): 250 mg via ORAL
  Filled 2022-02-17 (×2): qty 1

## 2022-02-17 MED ORDER — SODIUM CHLORIDE 0.9 % IV SOLN: INTRAVENOUS | Status: DC

## 2022-02-17 MED ORDER — TAMSULOSIN HCL 0.4 MG PO CAPS
0.4000 mg | ORAL_CAPSULE | Freq: Every day | ORAL | Status: DC
  Administered 2022-02-17 – 2022-02-18 (×2): 0.4 mg via ORAL
  Filled 2022-02-17 (×2): qty 1

## 2022-02-17 MED ORDER — ROPINIROLE HCL 0.5 MG PO TABS
2.0000 mg | ORAL_TABLET | Freq: Every day | ORAL | Status: DC
  Administered 2022-02-17: 2 mg via ORAL
  Filled 2022-02-17: qty 4

## 2022-02-17 MED ORDER — ACETAMINOPHEN 10 MG/ML IV SOLN
INTRAVENOUS | Status: DC | PRN
  Administered 2022-02-17: 1000 mg via INTRAVENOUS

## 2022-02-17 MED ORDER — LABETALOL HCL 5 MG/ML IV SOLN
INTRAVENOUS | Status: DC | PRN
  Administered 2022-02-17: 5 mg via INTRAVENOUS

## 2022-02-17 MED ORDER — DEXAMETHASONE SODIUM PHOSPHATE 10 MG/ML IJ SOLN
INTRAMUSCULAR | Status: DC | PRN
  Administered 2022-02-17: 10 mg via INTRAVENOUS

## 2022-02-17 MED ORDER — FLUTICASONE PROPIONATE 50 MCG/ACT NA SUSP
1.0000 | Freq: Every day | NASAL | Status: DC
  Administered 2022-02-17 – 2022-02-18 (×2): 1 via NASAL
  Filled 2022-02-17: qty 16

## 2022-02-17 MED ORDER — FENTANYL CITRATE (PF) 250 MCG/5ML IJ SOLN
INTRAMUSCULAR | Status: DC | PRN
  Administered 2022-02-17 (×5): 50 ug via INTRAVENOUS

## 2022-02-17 MED ORDER — LORATADINE 10 MG PO TABS
10.0000 mg | ORAL_TABLET | Freq: Every day | ORAL | Status: DC
  Administered 2022-02-17 – 2022-02-18 (×2): 10 mg via ORAL
  Filled 2022-02-17 (×2): qty 1

## 2022-02-17 MED ORDER — POLYETHYLENE GLYCOL 3350 17 G PO PACK: 17.0000 g | PACK | Freq: Every day | ORAL | Status: DC | PRN

## 2022-02-17 MED ORDER — LIDOCAINE 2% (20 MG/ML) 5 ML SYRINGE
INTRAMUSCULAR | Status: DC | PRN
  Administered 2022-02-17: 40 mg via INTRAVENOUS

## 2022-02-17 MED ORDER — DEXAMETHASONE SODIUM PHOSPHATE 10 MG/ML IJ SOLN
INTRAMUSCULAR | Status: AC
  Filled 2022-02-17: qty 2

## 2022-02-17 MED ORDER — HYDROMORPHONE HCL 1 MG/ML IJ SOLN
0.5000 mg | INTRAMUSCULAR | Status: DC | PRN
  Administered 2022-02-17 – 2022-02-18 (×3): 1 mg via INTRAVENOUS
  Filled 2022-02-17 (×3): qty 1

## 2022-02-17 MED ORDER — OXYCODONE HCL 5 MG PO TABS
ORAL_TABLET | ORAL | Status: AC
  Filled 2022-02-17: qty 2

## 2022-02-17 MED ORDER — BUSPIRONE HCL 15 MG PO TABS
15.0000 mg | ORAL_TABLET | Freq: Two times a day (BID) | ORAL | Status: DC
  Administered 2022-02-17 – 2022-02-18 (×2): 15 mg via ORAL
  Filled 2022-02-17 (×2): qty 1

## 2022-02-17 MED ORDER — CEFAZOLIN SODIUM-DEXTROSE 2-3 GM-%(50ML) IV SOLR
INTRAVENOUS | Status: DC | PRN
  Administered 2022-02-17: 2 g via INTRAVENOUS

## 2022-02-17 MED ORDER — HYDROMORPHONE HCL 1 MG/ML IJ SOLN
INTRAMUSCULAR | Status: DC | PRN
  Administered 2022-02-17: .5 mg via INTRAVENOUS

## 2022-02-17 MED ORDER — DOCUSATE SODIUM 100 MG PO CAPS
100.0000 mg | ORAL_CAPSULE | Freq: Two times a day (BID) | ORAL | Status: DC
  Administered 2022-02-17 – 2022-02-18 (×2): 100 mg via ORAL
  Filled 2022-02-17 (×2): qty 1

## 2022-02-17 MED ORDER — HYDROMORPHONE HCL 1 MG/ML IJ SOLN
0.2500 mg | INTRAMUSCULAR | Status: DC | PRN
  Administered 2022-02-17 (×4): 0.5 mg via INTRAVENOUS

## 2022-02-17 MED ORDER — SUCCINYLCHOLINE CHLORIDE 200 MG/10ML IV SOSY
PREFILLED_SYRINGE | INTRAVENOUS | Status: DC | PRN
  Administered 2022-02-17: 140 mg via INTRAVENOUS

## 2022-02-17 MED ORDER — ROCURONIUM BROMIDE 10 MG/ML (PF) SYRINGE
PREFILLED_SYRINGE | INTRAVENOUS | Status: AC
  Filled 2022-02-17: qty 10

## 2022-02-17 MED ORDER — ACETAMINOPHEN 325 MG PO TABS
650.0000 mg | ORAL_TABLET | Freq: Four times a day (QID) | ORAL | Status: DC | PRN
  Administered 2022-02-17: 650 mg via ORAL
  Filled 2022-02-17: qty 2

## 2022-02-17 MED ORDER — OXYCODONE HCL 5 MG PO TABS: 5.0000 mg | ORAL_TABLET | Freq: Once | ORAL | Status: DC | PRN

## 2022-02-17 MED ORDER — QUETIAPINE FUMARATE 50 MG PO TABS
200.0000 mg | ORAL_TABLET | Freq: Every day | ORAL | Status: DC
  Administered 2022-02-17: 200 mg via ORAL
  Filled 2022-02-17: qty 4

## 2022-02-17 MED ORDER — ROCURONIUM BROMIDE 10 MG/ML (PF) SYRINGE
PREFILLED_SYRINGE | INTRAVENOUS | Status: DC | PRN
  Administered 2022-02-17: 50 mg via INTRAVENOUS

## 2022-02-17 MED ORDER — PANTOPRAZOLE SODIUM 40 MG IV SOLR: 40.0000 mg | Freq: Every day | INTRAVENOUS | Status: DC

## 2022-02-17 MED ORDER — CYCLOBENZAPRINE HCL 10 MG PO TABS
10.0000 mg | ORAL_TABLET | Freq: Three times a day (TID) | ORAL | Status: DC | PRN
  Administered 2022-02-17: 10 mg via ORAL

## 2022-02-17 MED ORDER — ATORVASTATIN CALCIUM 80 MG PO TABS
80.0000 mg | ORAL_TABLET | Freq: Every day | ORAL | Status: DC
  Administered 2022-02-17 – 2022-02-18 (×2): 80 mg via ORAL
  Filled 2022-02-17 (×2): qty 1

## 2022-02-17 MED ORDER — CYCLOBENZAPRINE HCL 10 MG PO TABS
ORAL_TABLET | ORAL | Status: AC
  Filled 2022-02-17: qty 1

## 2022-02-17 MED ORDER — GABAPENTIN 300 MG PO CAPS
300.0000 mg | ORAL_CAPSULE | Freq: Three times a day (TID) | ORAL | Status: DC
  Administered 2022-02-17 – 2022-02-18 (×3): 300 mg via ORAL
  Filled 2022-02-17 (×3): qty 1

## 2022-02-17 MED ORDER — SODIUM CHLORIDE 0.9 % IR SOLN
Status: DC | PRN
  Administered 2022-02-17: 1000 mL

## 2022-02-17 MED ORDER — AZELASTINE HCL 0.1 % NA SOLN
1.0000 | Freq: Two times a day (BID) | NASAL | Status: DC
Start: 2022-02-17 — End: 2022-02-18
  Administered 2022-02-17 – 2022-02-18 (×2): 1 via NASAL
  Filled 2022-02-17: qty 30

## 2022-02-17 MED ORDER — FENTANYL CITRATE (PF) 250 MCG/5ML IJ SOLN
INTRAMUSCULAR | Status: AC
  Filled 2022-02-17: qty 5

## 2022-02-17 MED ORDER — KETAMINE HCL 50 MG/5ML IJ SOSY
PREFILLED_SYRINGE | INTRAMUSCULAR | Status: AC
  Filled 2022-02-17: qty 5

## 2022-02-17 MED ORDER — MOMETASONE FURO-FORMOTEROL FUM 100-5 MCG/ACT IN AERO
2.0000 | INHALATION_SPRAY | Freq: Two times a day (BID) | RESPIRATORY_TRACT | Status: DC
  Administered 2022-02-18: 2 via RESPIRATORY_TRACT
  Filled 2022-02-17: qty 8.8

## 2022-02-17 MED ORDER — BUPIVACAINE-EPINEPHRINE (PF) 0.25% -1:200000 IJ SOLN
INTRAMUSCULAR | Status: AC
  Filled 2022-02-17: qty 30

## 2022-02-17 MED ORDER — PROMETHAZINE HCL 25 MG/ML IJ SOLN: 6.2500 mg | INTRAMUSCULAR | Status: DC | PRN

## 2022-02-17 MED ORDER — SUCCINYLCHOLINE CHLORIDE 200 MG/10ML IV SOSY
PREFILLED_SYRINGE | INTRAVENOUS | Status: AC
  Filled 2022-02-17: qty 10

## 2022-02-17 MED ORDER — HYDROMORPHONE HCL 1 MG/ML IJ SOLN
INTRAMUSCULAR | Status: AC
  Filled 2022-02-17: qty 0.5

## 2022-02-17 MED ORDER — OXYCODONE HCL 5 MG PO TABS
5.0000 mg | ORAL_TABLET | ORAL | Status: DC | PRN
  Administered 2022-02-17 – 2022-02-18 (×4): 10 mg via ORAL
  Filled 2022-02-17 (×3): qty 2

## 2022-02-17 MED ORDER — ADULT MULTIVITAMIN W/MINERALS CH
1.0000 | ORAL_TABLET | Freq: Every day | ORAL | Status: DC
Start: 2022-02-17 — End: 2022-02-18
  Administered 2022-02-17 – 2022-02-18 (×2): 1 via ORAL
  Filled 2022-02-17 (×2): qty 1

## 2022-02-17 MED ORDER — ONDANSETRON 4 MG PO TBDP: 4.0000 mg | ORAL_TABLET | Freq: Four times a day (QID) | ORAL | Status: DC | PRN

## 2022-02-17 MED ORDER — ACETAMINOPHEN 10 MG/ML IV SOLN
INTRAVENOUS | Status: AC
  Filled 2022-02-17: qty 100

## 2022-02-17 MED ORDER — ALBUTEROL SULFATE (2.5 MG/3ML) 0.083% IN NEBU
2.5000 mg | INHALATION_SOLUTION | Freq: Four times a day (QID) | RESPIRATORY_TRACT | Status: DC | PRN
Start: 2022-02-17 — End: 2022-02-18

## 2022-02-17 MED ORDER — OXYCODONE HCL 5 MG/5ML PO SOLN: 5.0000 mg | Freq: Once | ORAL | Status: DC | PRN

## 2022-02-17 MED ORDER — LIDOCAINE 2% (20 MG/ML) 5 ML SYRINGE
INTRAMUSCULAR | Status: AC
  Filled 2022-02-17: qty 5

## 2022-02-17 MED ORDER — CHLORHEXIDINE GLUCONATE 0.12 % MT SOLN
OROMUCOSAL | Status: AC
  Administered 2022-02-17: 15 mL via OROMUCOSAL
  Filled 2022-02-17: qty 15

## 2022-02-17 MED ORDER — ORAL CARE MOUTH RINSE: 15.0000 mL | Freq: Once | OROMUCOSAL | Status: AC

## 2022-02-17 MED ORDER — ACETAMINOPHEN 650 MG RE SUPP: 650.0000 mg | Freq: Four times a day (QID) | RECTAL | Status: DC | PRN

## 2022-02-17 MED ORDER — SUGAMMADEX SODIUM 200 MG/2ML IV SOLN
INTRAVENOUS | Status: DC | PRN
  Administered 2022-02-17: 200 mg via INTRAVENOUS

## 2022-02-17 MED ORDER — UMECLIDINIUM BROMIDE 62.5 MCG/ACT IN AEPB
1.0000 | INHALATION_SPRAY | Freq: Every day | RESPIRATORY_TRACT | Status: DC
  Administered 2022-02-18: 1 via RESPIRATORY_TRACT
  Filled 2022-02-17: qty 7

## 2022-02-17 MED ORDER — ONDANSETRON HCL 4 MG/2ML IJ SOLN: 4.0000 mg | Freq: Four times a day (QID) | INTRAMUSCULAR | Status: DC | PRN

## 2022-02-17 MED ORDER — SERTRALINE HCL 50 MG PO TABS
25.0000 mg | ORAL_TABLET | Freq: Every day | ORAL | Status: DC
  Administered 2022-02-17 – 2022-02-18 (×2): 25 mg via ORAL
  Filled 2022-02-17 (×2): qty 1

## 2022-02-17 MED ORDER — DIPHENHYDRAMINE HCL 50 MG/ML IJ SOLN: 25.0000 mg | Freq: Four times a day (QID) | INTRAMUSCULAR | Status: DC | PRN

## 2022-02-17 MED ORDER — MIDAZOLAM HCL 2 MG/2ML IJ SOLN: 0.5000 mg | Freq: Once | INTRAMUSCULAR | Status: DC | PRN

## 2022-02-17 MED ORDER — 0.9 % SODIUM CHLORIDE (POUR BTL) OPTIME
TOPICAL | Status: DC | PRN
  Administered 2022-02-17: 1000 mL

## 2022-02-17 MED ORDER — ONDANSETRON HCL 4 MG/2ML IJ SOLN
INTRAMUSCULAR | Status: AC
  Filled 2022-02-17: qty 2

## 2022-02-17 MED ORDER — EPHEDRINE SULFATE (PRESSORS) 50 MG/ML IJ SOLN
INTRAMUSCULAR | Status: DC | PRN
  Administered 2022-02-17: 5 mg via INTRAVENOUS

## 2022-02-17 MED ORDER — BUPIVACAINE-EPINEPHRINE 0.25% -1:200000 IJ SOLN
INTRAMUSCULAR | Status: DC | PRN
  Administered 2022-02-17: 30 mL

## 2022-02-17 MED ORDER — BUDESON-GLYCOPYRROL-FORMOTEROL 160-9-4.8 MCG/ACT IN AERO: 2.0000 | INHALATION_SPRAY | Freq: Two times a day (BID) | RESPIRATORY_TRACT | Status: DC

## 2022-02-17 MED ORDER — PROPOFOL 10 MG/ML IV BOLUS
INTRAVENOUS | Status: DC | PRN
  Administered 2022-02-17 (×2): 10 mg via INTRAVENOUS
  Administered 2022-02-17: 200 mg via INTRAVENOUS
  Administered 2022-02-17: 20 mg via INTRAVENOUS

## 2022-02-17 MED ORDER — EPHEDRINE 5 MG/ML INJ
INTRAVENOUS | Status: AC
  Filled 2022-02-17: qty 10

## 2022-02-17 MED ORDER — KETOROLAC TROMETHAMINE 30 MG/ML IJ SOLN
INTRAMUSCULAR | Status: AC
  Filled 2022-02-17: qty 1

## 2022-02-17 MED ORDER — SODIUM CHLORIDE (PF) 0.9 % IJ SOLN
INTRAMUSCULAR | Status: AC
  Filled 2022-02-17: qty 10

## 2022-02-17 MED ORDER — SIMETHICONE 80 MG PO CHEW: 40.0000 mg | CHEWABLE_TABLET | Freq: Four times a day (QID) | ORAL | Status: DC | PRN

## 2022-02-17 MED ORDER — POLYVINYL ALCOHOL 1.4 % OP SOLN: 1.0000 [drp] | OPHTHALMIC | Status: DC | PRN

## 2022-02-17 MED ORDER — DEXMEDETOMIDINE (PRECEDEX) IN NS 20 MCG/5ML (4 MCG/ML) IV SYRINGE
PREFILLED_SYRINGE | INTRAVENOUS | Status: DC | PRN
  Administered 2022-02-17: 4 ug via INTRAVENOUS
  Administered 2022-02-17: 8 ug via INTRAVENOUS
  Administered 2022-02-17 (×2): 4 ug via INTRAVENOUS
  Administered 2022-02-17: 2 ug via INTRAVENOUS
  Administered 2022-02-17: 8 ug via INTRAVENOUS

## 2022-02-17 SURGICAL SUPPLY — 46 items
ADH SKN CLS APL DERMABOND .7 (GAUZE/BANDAGES/DRESSINGS) ×1
APL PRP STRL LF DISP 70% ISPRP (MISCELLANEOUS) ×1
BAG COUNTER SPONGE SURGICOUNT (BAG) ×3 IMPLANT
BAG SPEC RTRVL 10 TROC 200 (ENDOMECHANICALS) ×1
BAG SPNG CNTER NS LX DISP (BAG) ×1
BLADE CLIPPER SURG (BLADE) IMPLANT
CANISTER SUCT 3000ML PPV (MISCELLANEOUS) ×3 IMPLANT
CHLORAPREP W/TINT 26 (MISCELLANEOUS) ×3 IMPLANT
CLIP LIGATING HEMO LOK XL GOLD (MISCELLANEOUS) IMPLANT
CLIP LIGATING HEMO O LOK GREEN (MISCELLANEOUS) ×4 IMPLANT
COVER SURGICAL LIGHT HANDLE (MISCELLANEOUS) ×3 IMPLANT
DERMABOND ADVANCED (GAUZE/BANDAGES/DRESSINGS) ×1
DERMABOND ADVANCED .7 DNX12 (GAUZE/BANDAGES/DRESSINGS) ×2 IMPLANT
ELECT PENCIL ROCKER SW 15FT (MISCELLANEOUS) ×1 IMPLANT
ELECT REM PT RETURN 9FT ADLT (ELECTROSURGICAL) ×2
ELECTRODE REM PT RTRN 9FT ADLT (ELECTROSURGICAL) ×2 IMPLANT
ENDOLOOP SUT PDS II  0 18 (SUTURE) ×2
ENDOLOOP SUT PDS II 0 18 (SUTURE) IMPLANT
GLOVE BIOGEL PI IND STRL 7.0 (GLOVE) ×2 IMPLANT
GLOVE BIOGEL PI INDICATOR 7.0 (GLOVE) ×1
GLOVE SURG SS PI 7.0 STRL IVOR (GLOVE) ×3 IMPLANT
GOWN STRL REUS W/ TWL LRG LVL3 (GOWN DISPOSABLE) ×6 IMPLANT
GOWN STRL REUS W/TWL LRG LVL3 (GOWN DISPOSABLE) ×6
GRASPER SUT TROCAR 14GX15 (MISCELLANEOUS) ×3 IMPLANT
KIT BASIN OR (CUSTOM PROCEDURE TRAY) ×3 IMPLANT
KIT TURNOVER KIT B (KITS) ×3 IMPLANT
NEEDLE 22X1 1/2 (OR ONLY) (NEEDLE) ×3 IMPLANT
NS IRRIG 1000ML POUR BTL (IV SOLUTION) ×3 IMPLANT
PAD ARMBOARD 7.5X6 YLW CONV (MISCELLANEOUS) ×3 IMPLANT
POUCH RETRIEVAL ECOSAC 10 (ENDOMECHANICALS) ×2 IMPLANT
POUCH RETRIEVAL ECOSAC 10MM (ENDOMECHANICALS) ×2
SCISSORS LAP 5X35 DISP (ENDOMECHANICALS) ×3 IMPLANT
SET IRRIG TUBING LAPAROSCOPIC (IRRIGATION / IRRIGATOR) ×3 IMPLANT
SET TUBE SMOKE EVAC HIGH FLOW (TUBING) ×3 IMPLANT
SLEEVE ENDOPATH XCEL 5M (ENDOMECHANICALS) ×6 IMPLANT
SPECIMEN JAR SMALL (MISCELLANEOUS) ×3 IMPLANT
SUT MNCRL AB 4-0 PS2 18 (SUTURE) ×3 IMPLANT
SUT PDS AB 2-0 CT2 27 (SUTURE) ×1 IMPLANT
SUT VIC AB 3-0 SH 27 (SUTURE) ×2
SUT VIC AB 3-0 SH 27X BRD (SUTURE) IMPLANT
TOWEL GREEN STERILE (TOWEL DISPOSABLE) ×3 IMPLANT
TOWEL GREEN STERILE FF (TOWEL DISPOSABLE) ×3 IMPLANT
TRAY LAPAROSCOPIC MC (CUSTOM PROCEDURE TRAY) ×3 IMPLANT
TROCAR XCEL 12X100 BLDLESS (ENDOMECHANICALS) ×3 IMPLANT
TROCAR Z-THREAD OPTICAL 5X100M (TROCAR) ×3 IMPLANT
WATER STERILE IRR 1000ML POUR (IV SOLUTION) ×3 IMPLANT

## 2022-02-17 NOTE — ED Notes (Signed)
Mother notified per pt request of pending surgery. Pt alert, NAD, calm, interactive, denis pain or nausea at this time.

## 2022-02-17 NOTE — Discharge Instructions (Signed)
[facility]  Please arrive at least 30 min before your appointment to complete your check in paperwork.  If you are unable to arrive 30 min prior to your appointment time we may have to cancel or reschedule you. LAPAROSCOPIC SURGERY: POST OP INSTRUCTIONS Always review your discharge instruction sheet given to you by the facility where your surgery was performed. IF YOU HAVE DISABILITY OR FAMILY LEAVE FORMS, YOU MUST BRING THEM TO THE OFFICE FOR PROCESSING.   DO NOT GIVE THEM TO YOUR DOCTOR.  PAIN CONTROL  First take acetaminophen (Tylenol) AND/or ibuprofen (Advil) to control your pain after surgery.  Follow directions on package.  Taking acetaminophen (Tylenol) and/or ibuprofen (Advil) regularly after surgery will help to control your pain and lower the amount of prescription pain medication you may need.  You should not take more than 4,000 mg (4 grams) of acetaminophen (Tylenol) in 24 hours.  You should not take ibuprofen (Advil), aleve, motrin, naprosyn or other NSAIDS if you have a history of stomach ulcers or chronic kidney disease.  A prescription for pain medication may be given to you upon discharge.  Take your pain medication as prescribed, if you still have uncontrolled pain after taking acetaminophen (Tylenol) or ibuprofen (Advil). Use ice packs to help control pain. If you need a refill on your pain medication, please contact your pharmacy.  They will contact our office to request authorization. Prescriptions will not be filled after 5pm or on week-ends.  HOME MEDICATIONS Take your usually prescribed medications unless otherwise directed.  DIET You should follow a light diet the first few days after arrival home.  Be sure to include lots of fluids daily. Avoid fatty, fried foods.   CONSTIPATION It is common to experience some constipation after surgery and if you are taking pain medication.  Increasing fluid intake and taking a stool softener (such as Colace)  will usually help or prevent this problem from occurring.  A mild laxative (Milk of Magnesia or Miralax) should be taken according to package instructions if there are no bowel movements after 48 hours.  WOUND/INCISION CARE Most patients will experience some swelling and bruising in the area of the incisions.  Ice packs will help.  Swelling and bruising can take several days to resolve.  Unless discharge instructions indicate otherwise, follow guidelines below  STERI-STRIPS - you may remove your outer bandages 48 hours after surgery, and you may shower at that time.  You have steri-strips (small skin tapes) in place directly over the incision.  These strips should be left on the skin for 7-10 days.   DERMABOND/SKIN GLUE - you may shower in 24 hours.  The glue will flake off over the next 2-3 weeks. Any sutures or staples will be removed at the office during your follow-up visit.  ACTIVITIES You may resume regular (light) daily activities beginning the next day--such as daily self-care, walking, climbing stairs--gradually increasing activities as tolerated.  You may have sexual intercourse when it is comfortable.  Refrain from any heavy lifting or straining until approved by your doctor. You may drive when you are no longer taking prescription pain medication, you can comfortably wear a seatbelt, and you can safely maneuver your car and apply brakes.  FOLLOW-UP You should see your doctor in the office for a follow-up appointment approximately 2-3 weeks after your surgery.  You should have been given your post-op/follow-up appointment when your surgery was scheduled.  If you did not receive a post-op/follow-up appointment, make sure   that you call for this appointment within a day or two after you arrive home to insure a convenient appointment time.  OTHER INSTRUCTIONS  WHEN TO CALL YOUR DOCTOR: Fever over 101.0 Inability to urinate Continued bleeding from incision. Increased pain, redness, or  drainage from the incision. Increasing abdominal pain  The clinic staff is available to answer your questions during regular business hours.  Please don't hesitate to call and ask to speak to one of the nurses for clinical concerns.  If you have a medical emergency, go to the nearest emergency room or call 911.  A surgeon from [facility] is always on call at the hospital. [address], [address], [place], [place]  [address] ? [address] [address], [place], [place]   [address] [phone] ? [phone] ? FAX [phone]   

## 2022-02-17 NOTE — Anesthesia Postprocedure Evaluation (Signed)
Anesthesia Post Note  Patient: Keith Gibbs  Procedure(s) Performed: LAPAROSCOPIC CHOLECYSTECTOMY     Patient location during evaluation: PACU Anesthesia Type: General Level of consciousness: awake and alert, patient cooperative and oriented Pain control: pain improving. Vital Signs Assessment: post-procedure vital signs reviewed and stable Respiratory status: spontaneous breathing, nonlabored ventilation, respiratory function stable and patient connected to nasal cannula oxygen Cardiovascular status: blood pressure returned to baseline and stable Postop Assessment: no apparent nausea or vomiting Anesthetic complications: no   No notable events documented.  Last Vitals:  Vitals:   02/17/22 1245 02/17/22 1300  BP: (!) 173/82 (!) 177/76  Pulse: (!) 49 (!) 46  Resp: 17 15  Temp: 36.8 C   SpO2: 97% 97%    Last Pain:  Vitals:   02/17/22 1245  TempSrc:   PainSc: 10-Worst pain ever                 Amato Sevillano,E. Ajani Rineer

## 2022-02-17 NOTE — ED Notes (Signed)
PT changed into gown. Informed consent signed and at bedside. Safety socks applied.

## 2022-02-17 NOTE — Consult Note (Signed)
NAME:  Keith Gibbs, MRN:  [id number], DOB:  1960-12-29, LOS: 0 ADMISSION DATE:  02/16/2022, CONSULTATION DATE: 02/17/2022 REFERRING MD:  Dr Bobbye Morton, CHIEF COMPLAINT: History of COPD  History of Present Illness:  61 year old man with a history of tobacco use (40 pack years) chronic pain, chronic narcotic use, history of substance abuse.  He has been seen by Dr. Melvyn Novas in our office for dyspnea and presumed COPD.  He was treated for an acute exacerbation in May 2023.  He is in the ER now with right upper quadrant abdominal pain that has been intermittent and recurrent.  Cholecystectomy has been recommended.  He was changed from [facility] to [place] in May, also on fluticasone nasal spray, Zyrtec.  He uses albuterol rarely, not every day.  He has good exercise tolerance, able to walk indefinitely without dyspnea.  No wheezing, no significant sputum production.  Currently feeling well.   Pertinent  Medical History   Past Medical History:  Diagnosis Date   Anginal pain ([place])    Anxiety    Arthritis    Bipolar 1 disorder (HCC)    Bursitis    CAD (coronary artery disease)    Chronic pain    COPD (chronic obstructive pulmonary disease) (HCC)    Current use of long term anticoagulation    DAPT (ASA + clopidogrel)   Depression    Diverticulitis    Dyspnea    GERD (gastroesophageal reflux disease)    Grade I diastolic dysfunction    Hepatitis C 2012   No longer has Hep C   HLD (hyperlipidemia)    Hypertension    MI (myocardial infarction) ([place])    Polysubstance abuse (HCC)    cocaine, marijuana, ETOH   PUD (peptic ulcer disease)    S/P angioplasty with stent 06/10/2016   a.) 90% stenosis of pLAD to mLAD - 2.5 x 18 mm Xience Alpine (DES x 1) placed to pLAD   S/P PTCA (percutaneous transluminal coronary angioplasty) 12/04/2019   a.) 60% in stent restenosis of DES to pLAD; LVEF 65%.   Schizophrenia ([place])    Stroke [facility])    Valvular insufficiency    a.) Mild MR, TR, PR; mild to moderate AR  on 03/05/2018 TTE     Significant Hospital Events: Including procedures, antibiotic start and stop dates in addition to other pertinent events     Interim History / Subjective:  Complaining of nausea, abdominal pain  Objective   Blood pressure (!) 158/82, pulse (!) 56, temperature 98.9 F (37.2 C), temperature source Oral, resp. rate 16, SpO2 99 %.       No intake or output data in the 24 hours ending 02/17/22 0749 There were no vitals filed for this visit.  Examination: General: Comfortable, no distress, laying on stretcher HENT: Oropharynx clear, pupils equal Lungs: Clear bilaterally, no wheezing on normal breath or on a forced expiration Cardiovascular: Regular, distant, no murmur Abdomen: General diffuse tenderness to palpation.  Positive bowel sounds Extremities: No edema Neuro: Awake, alert, interacting appropriately, following commands GU: Deferred  Resolved Hospital Problem list     Assessment & Plan:  Chronic intermittent right upper quadrant pain, planning for cholecystectomy.  Planning for cholecystectomy  COPD.  He has not had recent pulmonary function testing, but overall he is well managed on Breztri.  He has a good functional capacity, minimal albuterol use.  No evidence of an acute exacerbation at this time.  Recommendations: -Patient is low to moderate risk for general anesthesia and abdominal  surgery.  No barriers identified to preclude proceeding.  No evidence of an acute exacerbation of his COPD -Would continue his Breztri through the hospitalization (or formulary equivalent, LABA/LAMA/ICS = Dulera + Incruse).  No new BD, steroids, antibiotics indicated at this time -Albuterol available if needed -Good pulmonary hygiene postoperatively   Best Practice (right click and "Reselect all SmartList Selections" daily)   Diet/type: NPO DVT prophylaxis: SCD GI prophylaxis: N/A Lines: N/A Foley:  N/A Code Status:  full code Last date of multidisciplinary  goals of care discussion [pending]  Labs   CBC: Recent Labs  Lab 02/14/22 0839 02/16/22 2339 02/17/22 0502  WBC 8.6 9.6  --   NEUTROABS 5.5 7.3  --   HGB 15.7 14.7 13.9  HCT 47.1 42.4 41.0  MCV 95.0 90.8  --   PLT 251 275  --     Basic Metabolic Panel: Recent Labs  Lab 02/14/22 0839 02/16/22 2339 02/17/22 0502  NA 139 134* 135  K 4.3 3.8 3.6  CL 106 100  --   CO2 23 25  --   GLUCOSE 94 108*  --   BUN 11 12  --   CREATININE 0.94 0.91  --   CALCIUM 9.2 9.4  --    GFR: CrCl cannot be calculated (Unknown ideal weight.). Recent Labs  Lab 02/14/22 0839 02/16/22 2339  WBC 8.6 9.6    Liver Function Tests: Recent Labs  Lab 02/14/22 0839 02/16/22 2339  AST 21 20  ALT 14 17  ALKPHOS 64 64  BILITOT 0.5 0.8  PROT 7.1 7.5  ALBUMIN 3.9 4.0   Recent Labs  Lab 02/14/22 0839 02/16/22 2339  LIPASE 32 31   No results for input(s): "AMMONIA" in the last 168 hours.  ABG    Component Value Date/Time   PHART 7.499 (H) 02/17/2022 0502   PCO2ART 26.7 (L) 02/17/2022 0502   PO2ART 105 02/17/2022 0502   HCO3 20.8 02/17/2022 0502   TCO2 22 02/17/2022 0502   ACIDBASEDEF 1.0 02/17/2022 0502   O2SAT 99 02/17/2022 0502     Coagulation Profile: No results for input(s): "INR", "PROTIME" in the last 168 hours.  Cardiac Enzymes: No results for input(s): "CKTOTAL", "CKMB", "CKMBINDEX", "TROPONINI" in the last 168 hours.  HbA1C: Hemoglobin A1C  Date/Time Value Ref Range Status  11/30/2012 11:30 AM 5.1  Final   Hgb A1c MFr Bld  Date/Time Value Ref Range Status  12/28/2016 06:51 AM 5.2 4.8 - 5.6 % Final    Comment:    (NOTE)         Pre-diabetes: 5.7 - 6.4         Diabetes: >6.4         Glycemic control for adults with diabetes: <7.0     CBG: No results for input(s): "GLUCAP" in the last 168 hours.  Review of Systems:   As per HPI  Past Medical History:  He,  has a past medical history of Anginal pain ([place]), Anxiety, Arthritis, Bipolar 1 disorder ([place]),  Bursitis, CAD (coronary artery disease), Chronic pain, COPD (chronic obstructive pulmonary disease) ([place]), Current use of long term anticoagulation, Depression, Diverticulitis, Dyspnea, GERD (gastroesophageal reflux disease), Grade I diastolic dysfunction, Hepatitis C (2012), HLD (hyperlipidemia), Hypertension, MI (myocardial infarction) ([place]), Polysubstance abuse ([place]), PUD (peptic ulcer disease), S/P angioplasty with stent (06/10/2016), S/P PTCA (percutaneous transluminal coronary angioplasty) (12/04/2019), Schizophrenia ([place]), Stroke ([place]), and Valvular insufficiency.   Surgical History:   Past Surgical History:  Procedure Laterality Date   ABDOMINAL SURGERY  removed small piece of intestines due to [facility] Diverticulosis   APPENDECTOMY     BACK SURGERY     CARDIAC CATHETERIZATION Left 06/10/2016   Procedure: Left Heart Cath and Coronary Angiography;  Surgeon: Dionisio David, MD;  Location: [place] [facility] LAB;  Service: Cardiovascular;  Laterality: Left;   CARDIAC CATHETERIZATION N/A 06/10/2016   Procedure: Coronary Stent Intervention;  Surgeon: Yolonda Kida, MD;  Location: [place] [facility];  Service: Cardiovascular;  Laterality: N/A;   COLON SURGERY     COLONOSCOPY     COLONOSCOPY WITH PROPOFOL N/A 01/05/2017   Procedure: COLONOSCOPY WITH PROPOFOL;  Surgeon: Jonathon Bellows, MD;  Location: [facility] ENDOSCOPY;  Service: Endoscopy;  Laterality: N/A;   COLONOSCOPY WITH PROPOFOL N/A 02/13/2020   Procedure: COLONOSCOPY WITH PROPOFOL;  Surgeon: Virgel Manifold, MD;  Location: [facility] ENDOSCOPY;  Service: Endoscopy;  Laterality: N/A;   CORONARY ANGIOPLASTY WITH STENT PLACEMENT     ESOPHAGOGASTRODUODENOSCOPY (EGD) WITH PROPOFOL N/A 01/05/2017   Procedure: ESOPHAGOGASTRODUODENOSCOPY (EGD) WITH PROPOFOL;  Surgeon: Jonathon Bellows, MD;  [facility] ENDOSCOPY;  Service: Endoscopy;  Laterality: N/A;   ESOPHAGOGASTRODUODENOSCOPY (EGD) WITH PROPOFOL N/A 02/13/2020   Procedure:  ESOPHAGOGASTRODUODENOSCOPY (EGD) WITH PROPOFOL;  Surgeon: Virgel Manifold, MD;  Location: [facility] ENDOSCOPY;  Service: Endoscopy;  Laterality: N/A;   INTRAVASCULAR PRESSURE WIRE/FFR STUDY N/A 12/04/2019   Procedure: INTRAVASCULAR PRESSURE WIRE/FFR STUDY;  Surgeon: Sherren Mocha, MD;  Location: [place] [facility] LAB;  Service: Cardiovascular;  Laterality: N/A;   KNEE ARTHROSCOPY WITH MEDIAL MENISECTOMY Right 09/05/2017   Procedure: KNEE ARTHROSCOPY WITH MEDIAL AND LATERAL  MENISECTOMY PARTIAL SYNOVECTOMY;  Surgeon: Hessie Knows, MD;  Location: [facility] ORS;  Service: Orthopedics;  Laterality: Right;   LEFT HEART CATH AND CORONARY ANGIOGRAPHY N/A 12/04/2019   Procedure: LEFT HEART CATH AND CORONARY ANGIOGRAPHY;  Surgeon: Sherren Mocha, MD;  Location: [place] [facility] LAB;  Service: Cardiovascular;  Laterality: N/A;   SHOULDER SURGERY Right 04/09/2012   SPINE SURGERY     TOTAL KNEE ARTHROPLASTY Right 01/19/2021   Procedure: TOTAL KNEE ARTHROPLASTY - Rachelle Hora to Assist;  Surgeon: Hessie Knows, MD;  Location: [facility] ORS;  Service: Orthopedics;  Laterality: Right;     Social History:   reports that he has been smoking cigarettes. He has a 16.00 pack-year smoking history. He has never used smokeless tobacco. He reports current alcohol use of about 6.0 standard drinks of alcohol per week. He reports that he does not currently use drugs after having used the following drugs: Cocaine and Marijuana.   Family History:  His family history includes Depression in his mother; Early death in his father; Heart attack in his father; Heart disease in his father and mother; Hypertension in his father, mother, and sister; Osteoarthritis in his mother. There is no history of Prostate cancer, Bladder Cancer, or Kidney cancer.   Allergies Allergies  Allergen Reactions   Latuda [Lurasidone Hcl] Other (See Comments)    tremor    Saphris [Asenapine] Other (See Comments)    Increased tremors      Home Medications   Prior to Admission medications   Medication Sig Start Date End Date Taking? Authorizing Provider  acetaminophen (TYLENOL) 500 MG tablet Take 2 tablets (1,000 mg total) by mouth every 6 (six) hours. Patient taking differently: Take 1,000 mg by mouth daily as needed for headache. 01/20/21   Duanne Guess, PA-C  albuterol (VENTOLIN HFA) 108 (90 Base) MCG/ACT inhaler Inhale 2 puffs into the lungs every 6 (six) hours as needed  for wheezing or shortness of breath. 05/28/19   Charlott Rakes, MD  aspirin EC 81 MG tablet Take 1 tablet (81 mg total) by mouth daily. Swallow whole. 01/03/22   Gildardo Pounds, NP  atorvastatin (LIPITOR) 80 MG tablet Take 80 mg by mouth daily. 08/28/20   [provider]  azelastine (ASTELIN) 0.1 % nasal spray Place 1 spray into both nostrils 2 (two) times daily. Use in each nostril as directed    [provider]  Budeson-Glycopyrrol-Formoterol (BREZTRI AEROSPHERE) 160-9-4.8 MCG/ACT AERO Take 2 puffs first thing in am and then another 2 puffs about 12 hours later. 12/14/21   Tanda Rockers, MD  busPIRone (BUSPAR) 15 MG tablet Take 1 tablet (15 mg total) by mouth 2 (two) times daily. 01/03/22   Gildardo Pounds, NP  cetirizine (ZYRTEC) 10 MG tablet TAKE 1 TABLET BY MOUTH EVERY DAY Patient taking differently: Take 10 mg by mouth daily. 02/02/21   Bacigalupo, Dionne Bucy, MD  cyclobenzaprine (FLEXERIL) 10 MG tablet TAKE 1 TABLET BY MOUTH THREE TIMES A DAY Patient taking differently: Take 10 mg by mouth daily as needed for muscle spasms. 09/21/21   Gwyneth Sprout, FNP  diclofenac (VOLTAREN) 50 MG EC tablet Take 50 mg by mouth 2 (two) times daily.    [provider]  divalproex (DEPAKOTE) 250 MG DR tablet Take 1 tablet (250 mg total) by mouth 2 (two) times daily. 01/10/22 01/10/23  Freida Busman, MD  fluticasone (FLONASE) 50 MCG/ACT nasal spray Place 1 spray into both nostrils at bedtime.    [provider]  mometasone-formoterol (DULERA) 200-5 MCG/ACT  AERO Inhale 2 puffs into the lungs 2 (two) times daily.    [provider]  Multiple Vitamin (MULTIVITAMIN) tablet Take 1 tablet by mouth daily.    [provider]  nitroGLYCERIN (NITROSTAT) 0.3 MG SL tablet Place 0.3 mg under the tongue every 5 (five) minutes as needed for chest pain. 12/01/20   [provider]  Omega-3 Fatty Acids (FISH OIL) 1200 MG CAPS Take 1,200 mg by mouth daily.    [provider]  omeprazole (PRILOSEC) 40 MG capsule TAKE 1 CAPSULE (40 MG TOTAL) BY MOUTH DAILY. Patient taking differently: Take 40 mg by mouth daily. 06/08/21   Virginia Crews, MD  QUEtiapine (SEROQUEL) 200 MG tablet Take 1 tablet (200 mg total) by mouth at bedtime. 01/10/22 01/10/23  Freida Busman, MD  rOPINIRole (REQUIP) 2 MG tablet TAKE 1 TABLET BY MOUTH EVERYDAY AT BEDTIME Patient taking differently: Take 2 mg by mouth at bedtime. 10/26/21   Virginia Crews, MD  sertraline (ZOLOFT) 25 MG tablet Take 1 tablet (25 mg total) by mouth daily. 01/24/22 01/24/23  Freida Busman, MD  SUBOXONE 8-2 MG FILM Place 1 Film under the tongue 2 (two) times daily. 08/27/21   [provider]  tamsulosin (FLOMAX) 0.4 MG CAPS capsule TAKE 1 CAPSULE BY MOUTH EVERY DAY Patient taking differently: Take 0.4 mg by mouth daily. 06/08/21   Virginia Crews, MD     Critical care time: NA     Baltazar Apo, MD, PhD 02/17/2022, 8:18 AM [facility] [phone] or if no answer before 7:00PM call [phone] For any issues after 7:00PM please call eLink [phone]

## 2022-02-17 NOTE — H&P (Signed)
Reason for Consult/Chief Complaint: symptomatic cholelithiasis Consultant: Elliot Gurney, [place]  Keith Gibbs is an 61 y.o. male.   HPI: 61M with multiple episodes of RUQ abdominal pain causing him to present to the ED. Presents today with similar sx. H/o COPD on 2 inhalers, currently taking suboxone. Associated n/v, back pain. Reports back pain radiates down his LLE and has associated neuropathy. Also reports R shoulder pain, but attributes this to prior neck/neuropathic sx. Seen in the office by Dr. Bobbye Morton 7/25 with plan for o/p lap chole, but recs for pulm eval pre-op.    Past Medical History:  Diagnosis Date   Anginal pain ([place])    Anxiety    Arthritis    Bipolar 1 disorder (HCC)    Bursitis    CAD (coronary artery disease)    Chronic pain    COPD (chronic obstructive pulmonary disease) (HCC)    Current use of long term anticoagulation    DAPT (ASA + clopidogrel)   Depression    Diverticulitis    Dyspnea    GERD (gastroesophageal reflux disease)    Grade I diastolic dysfunction    Hepatitis C 2012   No longer has Hep C   HLD (hyperlipidemia)    Hypertension    MI (myocardial infarction) ([place])    Polysubstance abuse (HCC)    cocaine, marijuana, ETOH   PUD (peptic ulcer disease)    S/P angioplasty with stent 06/10/2016   a.) 90% stenosis of pLAD to mLAD - 2.5 x 18 mm Xience Alpine (DES x 1) placed to pLAD   S/P PTCA (percutaneous transluminal coronary angioplasty) 12/04/2019   a.) 60% in stent restenosis of DES to pLAD; LVEF 65%.   Schizophrenia ([place])    Stroke [facility])    Valvular insufficiency    a.) Mild MR, TR, PR; mild to moderate AR on 03/05/2018 TTE    Past Surgical History:  Procedure Laterality Date   ABDOMINAL SURGERY     removed small piece of intestines due to [facility] Diverticulosis   APPENDECTOMY     BACK SURGERY     CARDIAC CATHETERIZATION Left 06/10/2016   Procedure: Left Heart Cath and Coronary Angiography;  Surgeon: Dionisio David, MD;  Location:  [place] [facility] LAB;  Service: Cardiovascular;  Laterality: Left;   CARDIAC CATHETERIZATION N/A 06/10/2016   Procedure: Coronary Stent Intervention;  Surgeon: Yolonda Kida, MD;  Location: [place] [facility] LAB;  Service: Cardiovascular;  Laterality: N/A;   COLON SURGERY     COLONOSCOPY     COLONOSCOPY WITH PROPOFOL N/A 01/05/2017   Procedure: COLONOSCOPY WITH PROPOFOL;  Surgeon: Jonathon Bellows, MD;  Location: [facility] ENDOSCOPY;  Service: Endoscopy;  Laterality: N/A;   COLONOSCOPY WITH PROPOFOL N/A 02/13/2020   Procedure: COLONOSCOPY WITH PROPOFOL;  Surgeon: Virgel Manifold, MD;  Location: [facility] ENDOSCOPY;  Service: Endoscopy;  Laterality: N/A;   CORONARY ANGIOPLASTY WITH STENT PLACEMENT     ESOPHAGOGASTRODUODENOSCOPY (EGD) WITH PROPOFOL N/A 01/05/2017   Procedure: ESOPHAGOGASTRODUODENOSCOPY (EGD) WITH PROPOFOL;  Surgeon: Jonathon Bellows, MD;  Location: [facility] ENDOSCOPY;  Service: Endoscopy;  Laterality: N/A;   ESOPHAGOGASTRODUODENOSCOPY (EGD) WITH PROPOFOL N/A 02/13/2020   Procedure: ESOPHAGOGASTRODUODENOSCOPY (EGD) WITH PROPOFOL;  Surgeon: Virgel Manifold, MD;  Location: [facility] ENDOSCOPY;  Service: Endoscopy;  Laterality: N/A;   INTRAVASCULAR PRESSURE WIRE/FFR STUDY N/A 12/04/2019   Procedure: INTRAVASCULAR PRESSURE WIRE/FFR STUDY;  Surgeon: Sherren Mocha, MD;  Location: [place] CV LAB;  Service: Cardiovascular;  Laterality: N/A;   KNEE ARTHROSCOPY WITH MEDIAL MENISECTOMY Right  09/05/2017   Procedure: KNEE ARTHROSCOPY WITH MEDIAL AND LATERAL  MENISECTOMY PARTIAL SYNOVECTOMY;  Surgeon: Hessie Knows, MD;  Location: [facility] ORS;  Service: Orthopedics;  Laterality: Right;   LEFT HEART CATH AND CORONARY ANGIOGRAPHY N/A 12/04/2019   Procedure: LEFT HEART CATH AND CORONARY ANGIOGRAPHY;  Surgeon: Sherren Mocha, MD;  Location: [place] CV LAB;  Service: Cardiovascular;  Laterality: N/A;   SHOULDER SURGERY Right 04/09/2012   SPINE SURGERY     TOTAL KNEE ARTHROPLASTY Right 01/19/2021    Procedure: TOTAL KNEE ARTHROPLASTY - Rachelle Hora to Assist;  Surgeon: Hessie Knows, MD;  Location: [facility] ORS;  Service: Orthopedics;  Laterality: Right;    Family History  Problem Relation Age of Onset   Osteoarthritis Mother    Heart disease Mother    Hypertension Mother    Depression Mother    Heart disease Father    Early death Father    Hypertension Father    Heart attack Father    Hypertension Sister    Prostate cancer Neg Hx    Bladder Cancer Neg Hx    Kidney cancer Neg Hx     Social History:  reports that he has been smoking cigarettes. He has a 16.00 pack-year smoking history. He has never used smokeless tobacco. He reports current alcohol use of about 6.0 standard drinks of alcohol per week. He reports that he does not currently use drugs after having used the following drugs: Cocaine and Marijuana.  Allergies:  Allergies  Allergen Reactions   Latuda [Lurasidone Hcl] Other (See Comments)    tremor    Saphris [Asenapine] Other (See Comments)    Increased tremors     Medications: I have reviewed the patient's current medications.  Results for orders placed or performed during the hospital encounter of 02/16/22 (from the past 48 hour(s))  CBC with Differential     Status: None   Collection Time: 02/16/22 11:39 PM  Result Value Ref Range   WBC 9.6 4.0 - 10.5 K/uL   RBC 4.67 4.22 - 5.81 MIL/uL   Hemoglobin 14.7 13.0 - 17.0 g/dL   HCT 42.4 39.0 - 52.0 %   MCV 90.8 80.0 - 100.0 fL   MCH 31.5 26.0 - 34.0 pg   MCHC 34.7 30.0 - 36.0 g/dL   RDW 13.2 11.5 - 15.5 %   Platelets 275 150 - 400 K/uL   nRBC 0.0 0.0 - 0.2 %   Neutrophils Relative % 76 %   Neutro Abs 7.3 1.7 - 7.7 K/uL   Lymphocytes Relative 18 %   Lymphs Abs 1.7 0.7 - 4.0 K/uL   Monocytes Relative 5 %   Monocytes Absolute 0.4 0.1 - 1.0 K/uL   Eosinophils Relative 1 %   Eosinophils Absolute 0.1 0.0 - 0.5 K/uL   Basophils Relative 0 %   Basophils Absolute 0.0 0.0 - 0.1 K/uL   Immature Granulocytes 0 %    Abs Immature Granulocytes 0.03 0.00 - 0.07 K/uL    Comment: Performed at [facility], [address] [place], [place] [address]  Comprehensive metabolic panel     Status: Abnormal   Collection Time: 02/16/22 11:39 PM  Result Value Ref Range   Sodium 134 (L) 135 - 145 mmol/L   Potassium 3.8 3.5 - 5.1 mmol/L   Chloride 100 98 - 111 mmol/L   CO2 25 22 - 32 mmol/L   Glucose, Bld 108 (H) 70 - 99 mg/dL    Comment: Glucose reference range applies only to samples  taken after fasting for at least 8 hours.   BUN 12 8 - 23 mg/dL   Creatinine, Ser 0.91 0.61 - 1.24 mg/dL   Calcium 9.4 8.9 - 10.3 mg/dL   Total Protein 7.5 6.5 - 8.1 g/dL   Albumin 4.0 3.5 - 5.0 g/dL   AST 20 15 - 41 U/L   ALT 17 0 - 44 U/L   Alkaline Phosphatase 64 38 - 126 U/L   Total Bilirubin 0.8 0.3 - 1.2 mg/dL   GFR, Estimated >60 >60 mL/min    Comment: (NOTE) Calculated using the CKD-EPI Creatinine Equation (2021)    Anion gap 9 5 - 15    Comment: Performed at [facility] [address] [place], [place] [address]  Troponin I (High Sensitivity)     Status: Abnormal   Collection Time: 02/16/22 11:39 PM  Result Value Ref Range   Troponin I (High Sensitivity) 20 (H) <18 ng/L    Comment: (NOTE) Elevated high sensitivity troponin I (hsTnI) values and significant  changes across serial measurements may suggest ACS but many other  chronic and acute conditions are known to elevate hsTnI results.  Refer to the "Links" section for chest pain algorithms and additional  guidance. Performed at [facility], [place] [address], [place], [place] [address]   Lipase, blood     Status: None   Collection Time: 02/16/22 11:39 PM  Result Value Ref Range   Lipase 31 11 - 51 U/L    Comment: Performed at [facility] [address] [place], [place] [address]    US Abdomen Limited RUQ (LIVER/GB)  Result Date: 02/17/2022 CLINICAL DATA:  Right upper quadrant abdominal pain EXAM: ULTRASOUND ABDOMEN LIMITED  RIGHT UPPER QUADRANT COMPARISON:  02/14/2022 FINDINGS: Gallbladder: Cholelithiasis, measuring up to 12 mm. No gallbladder wall thickening or pericholecystic fluid. Negative sonographic Murphy's sign. Common bile duct: Diameter: 5 mm Liver: No focal lesion identified. Within normal limits in parenchymal echogenicity. Portal vein is patent on color Doppler imaging with normal direction of blood flow towards the liver. Other: None. IMPRESSION: Cholelithiasis, without associated sonographic findings to suggest acute cholecystitis. Electronically Signed   By: Julian Hy M.D.   On: 02/17/2022 01:44    ROS 10 point review of systems is negative except as listed above in HPI.   Physical Exam Blood pressure (!) 158/82, pulse (!) 56, temperature 98.3 F (36.8 C), temperature source Oral, resp. rate 18, SpO2 99 %. Constitutional: well-developed, well-nourished HEENT: pupils equal, round, reactive to light, 90m b/l, moist conjunctiva, external inspection of ears and nose normal, hearing intact Oropharynx: normal oropharyngeal mucosa, poor dentition Neck: no thyromegaly, trachea midline, no midline cervical tenderness to palpation Chest: breath sounds equal bilaterally, normal respiratory effort, no midline or lateral chest wall tenderness to palpation/deformity Abdomen: soft, RUQ TTP, no bruising, no hepatosplenomegaly GU: no blood at urethral meatus of penis, no scrotal masses or abnormality  Back: no wounds, no thoracic/lumbar spine tenderness to palpation, no thoracic/lumbar spine stepoffs Rectal: deferred Extremities: 2+ radial and pedal pulses bilaterally, intact motor and sensation bilateral UE and LE, no peripheral edema MSK: normal gait/station, no clubbing/cyanosis of fingers/toes, normal ROM of all four extremities Skin: warm, dry, no rashes Psych: normal memory, normal mood/affect     Assessment/Plan: 78M with symptomatic cholelithiasis. Due to COPD hx, recommend pulm c/s for  optimization, plan for lap chole later today pending eval.   AJesusita Oka MD General and [facility]arborSurgery

## 2022-02-17 NOTE — ED Provider Notes (Signed)
[facility] EMERGENCY DEPARTMENT Provider Note   CSN: [id number] Arrival date & time: 02/16/22  2256     History  Chief Complaint  Patient presents with   Abdominal Pain    Keith Gibbs is a 61 y.o. male with a history of hypertension, COPD, CVA, MI, hepatitis C, schizophrenia, bipolar 1 disorder, polysubstance use, anxiety, depression.  Presents to the emergency department for complaint of abdominal pain.  Patient reports that his abdominal pain began yesterday at approximately 4 PM.  Pain has been constant since then.  Pain is located to his epigastric area and right upper quadrant.  Pain does not radiate.  Patient reports that pain is worse with touch or attempted to eat food.  Patient reports associated nausea and vomiting.  Patient states that he has vomited multiple times since his pain onset.  Emesis described as stomach contents and bilious.  Denies any hematemesis or coffee-ground emesis.  Patient denies any drug or alcohol use.  Denies any abdominal surgery.     Abdominal Pain Associated symptoms: chills, nausea and vomiting   Associated symptoms: no chest pain, no constipation, no diarrhea, no dysuria, no fever and no shortness of breath        Home Medications Prior to Admission medications   Medication Sig Start Date End Date Taking? Authorizing Provider  acetaminophen (TYLENOL) 500 MG tablet Take 2 tablets (1,000 mg total) by mouth every 6 (six) hours. Patient taking differently: Take 1,000 mg by mouth daily as needed for headache. 01/20/21   Duanne Guess, PA-C  albuterol (VENTOLIN HFA) 108 (90 Base) MCG/ACT inhaler Inhale 2 puffs into the lungs every 6 (six) hours as needed for wheezing or shortness of breath. 05/28/19   Charlott Rakes, MD  aspirin EC 81 MG tablet Take 1 tablet (81 mg total) by mouth daily. Swallow whole. 01/03/22   Gildardo Pounds, NP  atorvastatin (LIPITOR) 80 MG tablet Take 80 mg by mouth daily. 08/28/20   [provider]  azelastine (ASTELIN) 0.1 % nasal spray Place 1 spray into both nostrils 2 (two) times daily. Use in each nostril as directed    [provider]  Budeson-Glycopyrrol-Formoterol (BREZTRI AEROSPHERE) 160-9-4.8 MCG/ACT AERO Take 2 puffs first thing in am and then another 2 puffs about 12 hours later. 12/14/21   Tanda Rockers, MD  busPIRone (BUSPAR) 15 MG tablet Take 1 tablet (15 mg total) by mouth 2 (two) times daily. 01/03/22   Gildardo Pounds, NP  cetirizine (ZYRTEC) 10 MG tablet TAKE 1 TABLET BY MOUTH EVERY DAY Patient taking differently: Take 10 mg by mouth daily. 02/02/21   Bacigalupo, Dionne Bucy, MD  cyclobenzaprine (FLEXERIL) 10 MG tablet TAKE 1 TABLET BY MOUTH THREE TIMES A DAY Patient taking differently: Take 10 mg by mouth daily as needed for muscle spasms. 09/21/21   Gwyneth Sprout, FNP  diclofenac (VOLTAREN) 50 MG EC tablet Take 50 mg by mouth 2 (two) times daily.    [provider]  divalproex (DEPAKOTE) 250 MG DR tablet Take 1 tablet (250 mg total) by mouth 2 (two) times daily. 01/10/22 01/10/23  Freida Busman, MD  fluticasone (FLONASE) 50 MCG/ACT nasal spray Place 1 spray into both nostrils at bedtime.    [provider]  mometasone-formoterol (DULERA) 200-5 MCG/ACT AERO Inhale 2 puffs into the lungs 2 (two) times daily.    [provider]  Multiple Vitamin (MULTIVITAMIN) tablet Take 1 tablet by mouth daily.    [provider]  nitroGLYCERIN (NITROSTAT) 0.3 MG SL tablet Place 0.3 mg under the tongue every 5 (five) minutes as needed for chest pain. 12/01/20   [provider]  Omega-3 Fatty Acids (FISH OIL) 1200 MG CAPS Take 1,200 mg by mouth daily.    [provider]  omeprazole (PRILOSEC) 40 MG capsule TAKE 1 CAPSULE (40 MG TOTAL) BY MOUTH DAILY. Patient taking differently: Take 40 mg by mouth daily. 06/08/21   Virginia Crews, MD  QUEtiapine (SEROQUEL) 200 MG tablet Take 1 tablet (200 mg total) by mouth at  bedtime. 01/10/22 01/10/23  Freida Busman, MD  rOPINIRole (REQUIP) 2 MG tablet TAKE 1 TABLET BY MOUTH EVERYDAY AT BEDTIME Patient taking differently: Take 2 mg by mouth at bedtime. 10/26/21   Virginia Crews, MD  sertraline (ZOLOFT) 25 MG tablet Take 1 tablet (25 mg total) by mouth daily. 01/24/22 01/24/23  Freida Busman, MD  SUBOXONE 8-2 MG FILM Place 1 Film under the tongue 2 (two) times daily. 08/27/21   [provider]  tamsulosin (FLOMAX) 0.4 MG CAPS capsule TAKE 1 CAPSULE BY MOUTH EVERY DAY Patient taking differently: Take 0.4 mg by mouth daily. 06/08/21   Virginia Crews, MD      Allergies    Anette Guarneri [lurasidone hcl] and Saphris [asenapine]    Review of Systems   Review of Systems  Constitutional:  Positive for chills. Negative for fever.  Respiratory:  Negative for shortness of breath.   Cardiovascular:  Negative for chest pain.  Gastrointestinal:  Positive for abdominal pain, nausea and vomiting. Negative for abdominal distention, anal bleeding, blood in stool, constipation, diarrhea and rectal pain.  Genitourinary:  Negative for difficulty urinating and dysuria.  Musculoskeletal:  Negative for back pain and neck pain.  Skin:  Negative for color change and rash.  Neurological:  Negative for dizziness, syncope, light-headedness and headaches.  Psychiatric/Behavioral:  Negative for confusion.     Physical Exam Updated Vital Signs BP (!) 159/94 (BP Location: Right Arm)   Pulse (!) 55   Temp 98.4 F (36.9 C) (Oral)   Resp 19   SpO2 98%  Physical Exam Vitals and nursing note reviewed.  Constitutional:      General: He is not in acute distress.    Appearance: He is not ill-appearing, toxic-appearing or diaphoretic.  HENT:     Head: Normocephalic.  Eyes:     General: No scleral icterus.       Right eye: No discharge.        Left eye: No discharge.  Cardiovascular:     Rate and Rhythm: Normal rate.  Pulmonary:     Effort: Pulmonary effort is normal.   Abdominal:     General: Abdomen is flat. Bowel sounds are normal. There is no distension. There are no signs of injury.     Palpations: Abdomen is soft. There is no mass or pulsatile mass.     Tenderness: There is abdominal tenderness in the right upper quadrant and epigastric area. There is no guarding or rebound.     Hernia: There is no hernia in the umbilical area or ventral area.  Skin:    General: Skin is warm and dry.  Neurological:     General: No focal deficit present.     Mental Status: He is alert.  Psychiatric:        Behavior: Behavior is cooperative.     ED Results / Procedures / Treatments   Labs (all labs ordered are listed,  but only abnormal results are displayed) Labs Reviewed  COMPREHENSIVE METABOLIC PANEL - Abnormal; Notable for the following components:      Result Value   Sodium 134 (*)    Glucose, Bld 108 (*)    All other components within normal limits  TROPONIN I (HIGH SENSITIVITY) - Abnormal; Notable for the following components:   Troponin I (High Sensitivity) 20 (*)    All other components within normal limits  CBC WITH DIFFERENTIAL/PLATELET  LIPASE, BLOOD  TROPONIN I (HIGH SENSITIVITY)    EKG None  Radiology US Abdomen Limited RUQ (LIVER/GB)  Result Date: 02/17/2022 CLINICAL DATA:  Right upper quadrant abdominal pain EXAM: ULTRASOUND ABDOMEN LIMITED RIGHT UPPER QUADRANT COMPARISON:  02/14/2022 FINDINGS: Gallbladder: Cholelithiasis, measuring up to 12 mm. No gallbladder wall thickening or pericholecystic fluid. Negative sonographic Murphy's sign. Common bile duct: Diameter: 5 mm Liver: No focal lesion identified. Within normal limits in parenchymal echogenicity. Portal vein is patent on color Doppler imaging with normal direction of blood flow towards the liver. Other: None. IMPRESSION: Cholelithiasis, without associated sonographic findings to suggest acute cholecystitis. Electronically Signed   By: Julian Hy M.D.   On: 02/17/2022 01:44     Procedures Procedures    Medications Ordered in ED Medications  oxyCODONE-acetaminophen (PERCOCET/ROXICET) 5-325 MG per tablet 1 tablet (1 tablet Oral Given 02/17/22 0008)    ED Course/ Medical Decision Making/ A&P Clinical Course as of 02/17/22 0504  Thu Feb 17, 2022  0357 I spoke to Dr. Bobbye Morton who will see the patient for his symptomatic cholelithiasis. [PB]    Clinical Course User Index [PB] Loni Beckwith, PA-C                           Medical Decision Making Risk Prescription drug management. Decision regarding hospitalization.   Alert 61 year old male in no acute distress, nontoxic-appearing.  Presents to the ED with a chief complaint of abdominal pain.  Information obtained from patient.  I reviewed patient's past medical records including outpatient provider notes, labs, and imaging.  Patient has medical history as outlined in HPI which complicates his care.  Per chart review patient was seen by Dr. Bobbye Morton on 02/15/2022 in the outpatient setting with recommendation of laparoscopic cholecystectomy.  Patient seen on 7/24 for similar complaints of pain.  Patient has known cholelithiasis.  Ultrasound imaging and labs were obtained while patient was in triage.  I personally viewed interpret patient's lab results.  Pertinent findings include: -Troponin 20 -AST, ALT, alk phos, total bili, and lipase all within normal limits -CBC unremarkable  I personally viewed and interpreted patient's EKG.  Tracing shows sinus bradycardia with PAC.  I personally viewed interpret patient's ultrasound imaging.  Agree with radiology interpretation of cholelithiasis without any signs of cholecystitis  Due to patient having symptomatic cholelithiasis we will consult general surgery team.  Patient given Dilaudid and Zofran.  Due to elevated troponin we will check delta troponin.  Patient reports improvement in pain after receiving Dilaudid.  After counsel plan is for patient to be  taken to operating room for cholecystectomy with plan to discharge from PACU afterwards.          Final Clinical Impression(s) / ED Diagnoses Final diagnoses:  Symptomatic cholelithiasis    Rx / DC Orders ED Discharge Orders     None         Loni Beckwith, PA-C 02/17/22 0932    Merryl Hacker, MD 02/17/22 [phone]

## 2022-02-17 NOTE — Progress Notes (Signed)
Pre Procedure note for inpatients:   Keith Gibbs has been scheduled for Procedure(s): LAPAROSCOPIC CHOLECYSTECTOMY (N/A) today. The various methods of treatment have been discussed with the patient. After consideration of the risks, benefits and treatment options the patient has consented to the planned procedure.   The patient has been seen and labs reviewed. There are no changes in the patient's condition to prevent proceeding with the planned procedure today.  Recent labs:  Lab Results  Component Value Date   WBC 9.6 02/16/2022   HGB 13.9 02/17/2022   HCT 41.0 02/17/2022   PLT 275 02/16/2022   GLUCOSE 108 (H) 02/16/2022   CHOL 92 (L) 08/31/2021   TRIG 91 08/31/2021   HDL 33 (L) 08/31/2021   LDLCALC 41 08/31/2021   ALT 17 02/16/2022   AST 20 02/16/2022   NA 135 02/17/2022   K 3.6 02/17/2022   CL 100 02/16/2022   CREATININE 0.91 02/16/2022   BUN 12 02/16/2022   CO2 25 02/16/2022   TSH 1.610 09/08/2020   INR 1.0 01/29/2020   HGBA1C 5.2 12/28/2016    Mickeal Skinner, MD 02/17/2022 10:26 AM

## 2022-02-17 NOTE — Anesthesia Preprocedure Evaluation (Addendum)
Anesthesia Evaluation  Patient identified by MRN, date of birth, ID band Patient awake    Reviewed: Allergy & Precautions, NPO status , Patient's Chart, lab work & pertinent test results  History of Anesthesia Complications Negative for: history of anesthetic complications  Airway Mallampati: I  TM Distance: >3 FB Neck ROM: Full    Dental  (+) Edentulous Upper, Edentulous Lower   Pulmonary COPD, Current Smoker,    breath sounds clear to auscultation       Cardiovascular hypertension, (-) angina+ CAD, + Past MI and + Cardiac Stents   Rhythm:Regular Rate:Normal  '21 cath:  1. Single vessel CAD with moderate in-stent restenosis in the LAD, negative pressure wire evaluation 2. Patent left main, LCx, and RCA without significant stenosis 3. Normal LV function, LVEF 65%  '19 ECHO: normal LVF, apical hypokinesis, normal RVF, mild TR, mild MR, mild-mood AI   Neuro/Psych Anxiety Depression Bipolar Disorder Schizophrenia Chronic back pain: off of Suboxone x5 days CVA    GI/Hepatic GERD  Controlled,(+)     substance abuse  alcohol use, cocaine use and marijuana use, Hepatitis -N/v with acute chole   Endo/Other  negative endocrine ROS  Renal/GU negative Renal ROS     Musculoskeletal  (+) Arthritis ,   Abdominal   Peds  Hematology negative hematology ROS (+)   Anesthesia Other Findings   Reproductive/Obstetrics                            Anesthesia Physical Anesthesia Plan  ASA: 3  Anesthesia Plan: General   Post-op Pain Management: Precedex   Induction: Intravenous and Rapid sequence  PONV Risk Score and Plan: 1 and Ondansetron and Dexamethasone  Airway Management Planned: Oral ETT  Additional Equipment: None  Intra-op Plan:   Post-operative Plan: Extubation in OR  Informed Consent: I have reviewed the patients History and Physical, chart, labs and discussed the procedure including  the risks, benefits and alternatives for the proposed anesthesia with the patient or authorized representative who has indicated his/her understanding and acceptance.       Plan Discussed with: CRNA and Surgeon  Anesthesia Plan Comments:        Anesthesia Quick Evaluation

## 2022-02-17 NOTE — Op Note (Signed)
PATIENT:  Keith Gibbs  61 y.o. male  PRE-OPERATIVE DIAGNOSIS:  symptomatic cholelithiasis  POST-OPERATIVE DIAGNOSIS:  symptomatic cholelithiasis  PROCEDURE:  Procedure(s): LAPAROSCOPIC CHOLECYSTECTOMY   SURGEON:  Niara Bunker, Arta Bruce, MD   ANESTHESIA:   local and general  Indications for procedure: Keith Gibbs is a 61 y.o. male with symptoms of Abdominal pain consistent with gallbladder disease, Confirmed by ultrasound.  Description of procedure: The patient was brought into the operative suite, placed supine. Anesthesia was administered with endotracheal tube. Patient was strapped in place and foot board was secured. All pressure points were offloaded by foam padding. The patient was prepped and draped in the usual sterile fashion.  A left subcostal incision was made and optical entry was used to enter the abdomen. 2 5 mm trocars were placed on in the right lateral space on in the right subcostal space. A 37m trocar was placed in the periumbilical space Marcaine was infused to the subxiphoid space and lateral upper right abdomen in the transversus abdominis plane. Next the patient was placed in reverse trendelenberg. The gallbladder appeareddilated and chronically inflamed. Omentum was adhered to the gallbladder and was taken down with cautery/blunt dissection.  The gallbladder was retracted cephalad and lateral. The peritoneum was reflected off the infundibulum working lateral to medial. The cystic duct and cystic artery were identified and further dissection revealed a critical view. The cystic artery was doubly clipped and ligated. Due to size of cystic duct, a PDS endoloop was used for the cystic duct.  The gallbladder was removed off the liver bed with cautery. The Gallbladder was placed in a specimen bag. The gallbladder fossa was irrigated and hemostasis was applied with cautery. The gallbladder was removed via the 127mtrocar. The fascial defect was closed with interrupted 0  vicryl suture via laparoscopic trans-fascial suture passer. Pneumoperitoneum was removed, all trocar were removed. All incisions were closed with 4-0 monocryl subcuticular stitch. The patient woke from anesthesia and was brought to PACU in stable condition. All counts were correct  Findings: chronic cholecystitis  Specimen: gallbladder  Blood loss: 20 ml  Local anesthesia: 30 ml Marcaine  Complications: none  PLAN OF CARE: Admit for overnight observation  PATIENT DISPOSITION:  PACU - hemodynamically stable.  LuGakonaurgery, PAUtah

## 2022-02-17 NOTE — Transfer of Care (Signed)
Immediate Anesthesia Transfer of Care Note  Patient: Keith Gibbs  Procedure(s) Performed: LAPAROSCOPIC CHOLECYSTECTOMY  Patient Location: PACU  Anesthesia Type:General  Level of Consciousness: awake, alert  and oriented  Airway & Oxygen Therapy: Patient Spontanous Breathing  Post-op Assessment: Report given to RN, Post -op Vital signs reviewed and stable and Patient moving all extremities  Post vital signs: Reviewed and stable  Last Vitals:  Vitals Value Taken Time  BP 173/82 02/17/22 1245  Temp    Pulse 47 02/17/22 1247  Resp 18 02/17/22 1247  SpO2 97 % 02/17/22 1247  Vitals shown include unvalidated device data.  Last Pain:  Vitals:   02/17/22 1017  TempSrc:   PainSc: 8       Patients Stated Pain Goal: 3 (17/12/78 7183)  Complications: No notable events documented.

## 2022-02-17 NOTE — Anesthesia Procedure Notes (Signed)
Procedure Name: Intubation Date/Time: 02/17/2022 11:20 AM  Performed by: Amadeo Garnet, CRNAPre-anesthesia Checklist: Patient identified, Emergency Drugs available, Suction available and Patient being monitored Patient Re-evaluated:Patient Re-evaluated prior to induction Oxygen Delivery Method: Circle system utilized Preoxygenation: Pre-oxygenation with 100% oxygen Induction Type: IV induction, Rapid sequence and Cricoid Pressure applied Laryngoscope Size: Mac and 4 Grade View: Grade I Tube type: Oral Tube size: 7.5 mm Number of attempts: 1 Airway Equipment and Method: Stylet and Oral airway Placement Confirmation: ETT inserted through vocal cords under direct vision, positive ETCO2 and breath sounds checked- equal and bilateral Secured at: 23 cm Tube secured with: Tape Dental Injury: Teeth and Oropharynx as per pre-operative assessment

## 2022-02-18 ENCOUNTER — Encounter (HOSPITAL_COMMUNITY): Payer: Self-pay | Admitting: General Surgery

## 2022-02-18 ENCOUNTER — Telehealth: Payer: Self-pay | Admitting: Internal Medicine

## 2022-02-18 LAB — SURGICAL PATHOLOGY

## 2022-02-18 MED ORDER — ACETAMINOPHEN 500 MG PO TABS
1000.0000 mg | ORAL_TABLET | Freq: Four times a day (QID) | ORAL | Status: DC
  Administered 2022-02-18: 1000 mg via ORAL
  Filled 2022-02-18: qty 2

## 2022-02-18 MED ORDER — POLYETHYLENE GLYCOL 3350 17 G PO PACK: 17.0000 g | PACK | Freq: Every day | ORAL | 0 refills | Status: DC | PRN

## 2022-02-18 MED ORDER — HYDROMORPHONE HCL 1 MG/ML IJ SOLN: 0.5000 mg | INTRAMUSCULAR | Status: DC | PRN

## 2022-02-18 MED ORDER — CYCLOBENZAPRINE HCL 10 MG PO TABS: 10.0000 mg | ORAL_TABLET | Freq: Every day | ORAL | 0 refills | Status: DC | PRN

## 2022-02-18 MED ORDER — GUAIFENESIN ER 600 MG PO TB12
600.0000 mg | ORAL_TABLET | Freq: Two times a day (BID) | ORAL | Status: DC
  Administered 2022-02-18: 600 mg via ORAL
  Filled 2022-02-18: qty 1

## 2022-02-18 MED ORDER — OXYCODONE HCL 10 MG PO TABS
5.0000 mg | ORAL_TABLET | Freq: Four times a day (QID) | ORAL | 0 refills | Status: DC | PRN
Start: 2022-02-18 — End: 2022-03-02

## 2022-02-18 MED ORDER — METHOCARBAMOL 500 MG PO TABS
500.0000 mg | ORAL_TABLET | Freq: Four times a day (QID) | ORAL | Status: DC
  Administered 2022-02-18: 500 mg via ORAL
  Filled 2022-02-18: qty 1

## 2022-02-18 MED ORDER — CYCLOBENZAPRINE HCL 10 MG PO TABS: 10.0000 mg | ORAL_TABLET | Freq: Three times a day (TID) | ORAL | Status: DC

## 2022-02-18 NOTE — Plan of Care (Signed)
  Problem: Pain Managment: Goal: General experience of comfort will improve Outcome: Progressing   

## 2022-02-18 NOTE — Progress Notes (Signed)
Nsg Discharge Note  Admit Date:  02/16/2022 Discharge date: 02/18/2022   Oracio Galen Theurer to be D/C'd Home per MD order.  AVS completed. Patient/caregiver able to verbalize understanding.  Discharge Medication: Allergies as of 02/18/2022       Reactions   Latuda [lurasidone Hcl] Other (See Comments)   tremor   Saphris [asenapine] Other (See Comments)   Increased tremors        Medication List     TAKE these medications    acetaminophen 500 MG tablet Commonly known as: TYLENOL Take 2 tablets (1,000 mg total) by mouth every 6 (six) hours. What changed:  when to take this reasons to take this   albuterol 108 (90 Base) MCG/ACT inhaler Commonly known as: VENTOLIN HFA Inhale 2 puffs into the lungs every 6 (six) hours as needed for wheezing or shortness of breath.   aspirin EC 81 MG tablet Take 1 tablet (81 mg total) by mouth daily. Swallow whole.   atorvastatin 80 MG tablet Commonly known as: LIPITOR Take 80 mg by mouth daily.   azelastine 0.1 % nasal spray Commonly known as: ASTELIN Place 1 spray into both nostrils 2 (two) times daily. Use in each nostril as directed   Breztri Aerosphere 160-9-4.8 MCG/ACT Aero Generic drug: Budeson-Glycopyrrol-Formoterol Take 2 puffs first thing in am and then another 2 puffs about 12 hours later.   busPIRone 15 MG tablet Commonly known as: BUSPAR Take 1 tablet (15 mg total) by mouth 2 (two) times daily.   cetirizine 10 MG tablet Commonly known as: ZYRTEC TAKE 1 TABLET BY MOUTH EVERY DAY   cyclobenzaprine 10 MG tablet Commonly known as: FLEXERIL Take 1 tablet (10 mg total) by mouth daily as needed for muscle spasms.   diclofenac 75 MG EC tablet Commonly known as: VOLTAREN SMARTSIG:1 Tablet(s) By Mouth Every 12 Hours   divalproex 250 MG DR tablet Commonly known as: Depakote Take 1 tablet (250 mg total) by mouth 2 (two) times daily.   Fish Oil 1200 MG Caps Take 1,200 mg by mouth daily.   fluticasone 50 MCG/ACT nasal  spray Commonly known as: FLONASE Place 1 spray into both nostrils at bedtime.   gabapentin 300 MG capsule Commonly known as: NEURONTIN Take 300 mg by mouth 3 (three) times daily.   multivitamin tablet Take 1 tablet by mouth daily.   nitroGLYCERIN 0.3 MG SL tablet Commonly known as: NITROSTAT Place 0.3 mg under the tongue every 5 (five) minutes as needed for chest pain.   omeprazole 40 MG capsule Commonly known as: PRILOSEC TAKE 1 CAPSULE (40 MG TOTAL) BY MOUTH DAILY. What changed: how much to take   Oxycodone HCl 10 MG Tabs Take 0.5-1 tablets (5-10 mg total) by mouth every 6 (six) hours as needed for moderate pain or severe pain ('5mg'$  moderate, '10mg'$  severe).   polyethylene glycol 17 g packet Commonly known as: MIRALAX / GLYCOLAX Take 17 g by mouth daily as needed for mild constipation.   QUEtiapine 200 MG tablet Commonly known as: SEROQUEL Take 1 tablet (200 mg total) by mouth at bedtime.   rOPINIRole 2 MG tablet Commonly known as: REQUIP TAKE 1 TABLET BY MOUTH EVERYDAY AT BEDTIME What changed: See the new instructions.   sertraline 25 MG tablet Commonly known as: Zoloft Take 1 tablet (25 mg total) by mouth daily.   tamsulosin 0.4 MG Caps capsule Commonly known as: FLOMAX TAKE 1 CAPSULE BY MOUTH EVERY DAY        Discharge Assessment: Vitals:  02/18/22 0855 02/18/22 1007  BP:  122/74  Pulse:  74  Resp:  18  Temp:  98.3 F (36.8 C)  SpO2: 96% 97%   Skin clean, dry and intact without evidence of skin break down, no evidence of skin tears noted. IV catheter discontinued intact. Site without signs and symptoms of complications - no redness or edema noted at insertion site, patient denies c/o pain - only slight tenderness at site.  Dressing with slight pressure applied.  D/c Instructions-Education: Discharge instructions given to patient/family with verbalized understanding. D/c education completed with patient/family including follow up instructions,  medication list, d/c activities limitations if indicated, with other d/c instructions as indicated by MD - patient able to verbalize understanding, all questions fully answered. Patient instructed to return to ED, call 911, or call MD for any changes in condition.  Patient escorted via [place], and D/C home via private auto.  Atilano Ina, RN 02/18/2022 10:47 AM

## 2022-02-18 NOTE — Telephone Encounter (Signed)
Attempted to call patient but he did not answer. Left message for him to call us back. Per his chart, he is currently admitted.

## 2022-02-18 NOTE — Discharge Summary (Signed)
[facility] Discharge Summary   Patient ID: Keith Gibbs MRN: [id number] DOB/AGE: 11/12/1960 61 y.o.  Admit date: 02/16/2022 Discharge date: 02/18/2022  Admitting Diagnosis: Symptomatic cholelithiasis  Discharge Diagnosis Symptomatic cholelithiasis  Consultants Pulmonology  Imaging: US Abdomen Limited RUQ (LIVER/GB)  Result Date: 02/17/2022 CLINICAL DATA:  Right upper quadrant abdominal pain EXAM: ULTRASOUND ABDOMEN LIMITED RIGHT UPPER QUADRANT COMPARISON:  02/14/2022 FINDINGS: Gallbladder: Cholelithiasis, measuring up to 12 mm. No gallbladder wall thickening or pericholecystic fluid. Negative sonographic Murphy's sign. Common bile duct: Diameter: 5 mm Liver: No focal lesion identified. Within normal limits in parenchymal echogenicity. Portal vein is patent on color Doppler imaging with normal direction of blood flow towards the liver. Other: None. IMPRESSION: Cholelithiasis, without associated sonographic findings to suggest acute cholecystitis. Electronically Signed   By: Julian Hy M.D.   On: 02/17/2022 01:44    Procedures Dr. Kieth Brightly (02/17/2022) - Laparoscopic Cholecystectomy   Hospital Course:  Keith Gibbs is a 61 y.o. male with multiple medical issues including COPD and h/o substance abuse recently on suboxone, who presented to [facility] 7/26 with multiple episodes of RUQ abdominal pain.  Workup showed symptomatic cholelithiasis. Pulmonology was consulted preop for recommendations on optimization.  Patient was admitted and underwent procedure listed above.  Tolerated procedure well and was transferred to the floor.  Diet was advanced as tolerated.  Pn POD1, the patient was voiding well, tolerating diet, ambulating well, pain well controlled, vital signs stable, incisions c/d/i and felt stable for discharge home.  Patient will follow up as below and knows to call with questions or concerns.    I have personally reviewed the patients medication history on the  [place] controlled substance database.     Physical Exam: General:  Alert, NAD, comfortable Pulm: rate and effort normal on room air Abd:  Soft, ND, appropriately tender, multiple lap incisions C/D/I without erythema or drainage  Allergies as of 02/18/2022       Reactions   Latuda [lurasidone Hcl] Other (See Comments)   tremor   Saphris [asenapine] Other (See Comments)   Increased tremors        Medication List     TAKE these medications    acetaminophen 500 MG tablet Commonly known as: TYLENOL Take 2 tablets (1,000 mg total) by mouth every 6 (six) hours. What changed:  when to take this reasons to take this   albuterol 108 (90 Base) MCG/ACT inhaler Commonly known as: VENTOLIN HFA Inhale 2 puffs into the lungs every 6 (six) hours as needed for wheezing or shortness of breath.   aspirin EC 81 MG tablet Take 1 tablet (81 mg total) by mouth daily. Swallow whole.   atorvastatin 80 MG tablet Commonly known as: LIPITOR Take 80 mg by mouth daily.   azelastine 0.1 % nasal spray Commonly known as: ASTELIN Place 1 spray into both nostrils 2 (two) times daily. Use in each nostril as directed   Breztri Aerosphere 160-9-4.8 MCG/ACT Aero Generic drug: Budeson-Glycopyrrol-Formoterol Take 2 puffs first thing in am and then another 2 puffs about 12 hours later.   busPIRone 15 MG tablet Commonly known as: BUSPAR Take 1 tablet (15 mg total) by mouth 2 (two) times daily.   cetirizine 10 MG tablet Commonly known as: ZYRTEC TAKE 1 TABLET BY MOUTH EVERY DAY   cyclobenzaprine 10 MG tablet Commonly known as: FLEXERIL Take 1 tablet (10 mg total) by mouth daily as needed for muscle spasms.   diclofenac 75 MG EC tablet Commonly known as: VOLTAREN  SMARTSIG:1 Tablet(s) By Mouth Every 12 Hours   divalproex 250 MG DR tablet Commonly known as: Depakote Take 1 tablet (250 mg total) by mouth 2 (two) times daily.   Fish Oil 1200 MG Caps Take 1,200 mg by mouth daily.   fluticasone 50  MCG/ACT nasal spray Commonly known as: FLONASE Place 1 spray into both nostrils at bedtime.   gabapentin 300 MG capsule Commonly known as: NEURONTIN Take 300 mg by mouth 3 (three) times daily.   multivitamin tablet Take 1 tablet by mouth daily.   nitroGLYCERIN 0.3 MG SL tablet Commonly known as: NITROSTAT Place 0.3 mg under the tongue every 5 (five) minutes as needed for chest pain.   omeprazole 40 MG capsule Commonly known as: PRILOSEC TAKE 1 CAPSULE (40 MG TOTAL) BY MOUTH DAILY. What changed: how much to take   Oxycodone HCl 10 MG Tabs Take 0.5-1 tablets (5-10 mg total) by mouth every 6 (six) hours as needed for moderate pain or severe pain ('5mg'$  moderate, '10mg'$  severe).   polyethylene glycol 17 g packet Commonly known as: MIRALAX / GLYCOLAX Take 17 g by mouth daily as needed for mild constipation.   QUEtiapine 200 MG tablet Commonly known as: SEROQUEL Take 1 tablet (200 mg total) by mouth at bedtime.   rOPINIRole 2 MG tablet Commonly known as: REQUIP TAKE 1 TABLET BY MOUTH EVERYDAY AT BEDTIME What changed: See the new instructions.   sertraline 25 MG tablet Commonly known as: Zoloft Take 1 tablet (25 mg total) by mouth daily.   tamsulosin 0.4 MG Caps capsule Commonly known as: FLOMAX TAKE 1 CAPSULE BY MOUTH EVERY DAY          Follow-up [facility]. Go on 03/10/2022.   Specialty: General Surgery Why: Your appointment is 03/10/22 at 3 pm  Arrive 71mn early to check in, fill out paperwork, BEngineer, civil (consulting)ID and insurance information Contact information: [phone]       FGildardo Pounds NP. Call.   Specialty: Nurse Practitioner Why: Call for posthospitalization follow up appointment. Notify your pain management provider that you received a prescription for oxycodone after this admission Contact information: [id number] [place]  [phone]                 Signed: BW[facility] 02/18/2022, 8:33 AM Please see Amion for pager number during day hours 7:00am-4:30pm

## 2022-02-18 NOTE — Telephone Encounter (Signed)
I did not see him 02/14/22 in clinic and there are no appts or images on the books   He needs a CT chest s contrast f/u SPN 03/18/22 and f/u ov w/in a week of that ov to regroup

## 2022-02-18 NOTE — Telephone Encounter (Signed)
Fax received from Dr. Reather Laurence with [facility] to perform a Gallbladder Surgery on patient.  Patient needs surgery clearance. Surgery is TBD. Patient was seen on 12/14/2021. Office protocol is a risk assessment can be sent to surgeon if patient has been seen in 60 days or less.   Sending to Dr Melvyn Novas for risk assessment or recommendations if patient needs to be seen in office prior to surgical procedure.

## 2022-02-18 NOTE — Progress Notes (Signed)
Pt discharged  to home. DC completed by discharge/float RN. See RN's note for details.

## 2022-02-21 ENCOUNTER — Other Ambulatory Visit: Payer: Self-pay | Admitting: Family Medicine

## 2022-02-21 DIAGNOSIS — K219 Gastro-esophageal reflux disease without esophagitis: Secondary | ICD-10-CM

## 2022-02-22 ENCOUNTER — Other Ambulatory Visit: Payer: Self-pay | Admitting: Family Medicine

## 2022-02-22 DIAGNOSIS — F32A Depression, unspecified: Secondary | ICD-10-CM

## 2022-02-22 DIAGNOSIS — G2581 Restless legs syndrome: Secondary | ICD-10-CM

## 2022-02-22 DIAGNOSIS — M545 Low back pain, unspecified: Secondary | ICD-10-CM

## 2022-02-22 DIAGNOSIS — M5136 Other intervertebral disc degeneration, lumbar region: Secondary | ICD-10-CM

## 2022-02-22 DIAGNOSIS — F419 Anxiety disorder, unspecified: Secondary | ICD-10-CM

## 2022-02-23 ENCOUNTER — Other Ambulatory Visit: Payer: Self-pay | Admitting: Orthopedic Surgery

## 2022-02-23 ENCOUNTER — Other Ambulatory Visit (HOSPITAL_COMMUNITY): Payer: Self-pay | Admitting: Orthopedic Surgery

## 2022-02-23 DIAGNOSIS — Z981 Arthrodesis status: Secondary | ICD-10-CM

## 2022-02-23 DIAGNOSIS — M5416 Radiculopathy, lumbar region: Secondary | ICD-10-CM

## 2022-02-23 DIAGNOSIS — M5030 Other cervical disc degeneration, unspecified cervical region: Secondary | ICD-10-CM

## 2022-02-23 DIAGNOSIS — M47816 Spondylosis without myelopathy or radiculopathy, lumbar region: Secondary | ICD-10-CM
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
## 2022-02-23 NOTE — Telephone Encounter (Signed)
Requested medication (s) are due for refill today: yes to both  Requested medication (s) are on the active medication list: yes to both  Last refill:  06/08/21 for both  Future visit scheduled: yes  Notes to clinic:  no refill protocol needed   Requested Prescriptions  Pending Prescriptions Disp Refills   omeprazole (PRILOSEC) 40 MG capsule [Pharmacy Med Name: OMEPRAZOLE DR 40 MG CAPSULE] 90 capsule 0    Sig: TAKE 1 CAPSULE (40 MG TOTAL) BY MOUTH DAILY.     There is no refill protocol information for this order     tamsulosin (FLOMAX) 0.4 MG CAPS capsule [Pharmacy Med Name: TAMSULOSIN HCL 0.4 MG CAPSULE] 90 capsule 1    Sig: TAKE 1 CAPSULE BY MOUTH EVERY DAY     There is no refill protocol information for this order

## 2022-02-23 NOTE — Telephone Encounter (Signed)
Requested Prescriptions  Pending Prescriptions Disp Refills  . cetirizine (ZYRTEC) 10 MG tablet [Pharmacy Med Name: CETIRIZINE HCL 10 MG TABLET] 90 tablet 3    Sig: TAKE 1 TABLET BY MOUTH EVERY DAY     There is no refill protocol information for this order    . DULoxetine (CYMBALTA) 60 MG capsule [Pharmacy Med Name: DULOXETINE HCL DR 60 MG CAP] 90 capsule 3    Sig: TAKE 1 CAPSULE BY MOUTH EVERY DAY     There is no refill protocol information for this order    . rOPINIRole (REQUIP) 2 MG tablet [Pharmacy Med Name: ROPINIROLE HCL 2 MG TABLET] 30 tablet 0    Sig: TAKE 1 TABLET BY MOUTH EVERYDAY AT BEDTIME     There is no refill protocol information for this order    . meloxicam (MOBIC) 15 MG tablet [Pharmacy Med Name: MELOXICAM 15 MG TABLET] 30 tablet     Sig: TAKE 1 TABLET (15 MG TOTAL) BY MOUTH DAILY.     There is no refill protocol information for this order

## 2022-02-28 ENCOUNTER — Inpatient Hospital Stay (HOSPITAL_COMMUNITY)
Admission: EM | Admit: 2022-02-28 | Discharge: 2022-03-02 | DRG: 948 | Disposition: A | Discharge status: Home / Self Care | Payer: Medicaid Other | Attending: Surgery | Admitting: Surgery

## 2022-02-28 ENCOUNTER — Encounter (HOSPITAL_COMMUNITY): Payer: Self-pay | Admitting: Emergency Medicine

## 2022-02-28 ENCOUNTER — Emergency Department (HOSPITAL_COMMUNITY): Payer: Medicaid Other

## 2022-02-28 DIAGNOSIS — I10 Essential (primary) hypertension: Secondary | ICD-10-CM | POA: Diagnosis present

## 2022-02-28 DIAGNOSIS — F209 Schizophrenia, unspecified: Secondary | ICD-10-CM | POA: Diagnosis present

## 2022-02-28 DIAGNOSIS — Z8711 Personal history of peptic ulcer disease: Secondary | ICD-10-CM | POA: Diagnosis not present

## 2022-02-28 DIAGNOSIS — D72829 Elevated white blood cell count, unspecified: Secondary | ICD-10-CM | POA: Diagnosis not present

## 2022-02-28 DIAGNOSIS — J449 Chronic obstructive pulmonary disease, unspecified: Secondary | ICD-10-CM | POA: Diagnosis present

## 2022-02-28 DIAGNOSIS — K219 Gastro-esophageal reflux disease without esophagitis: Secondary | ICD-10-CM | POA: Diagnosis present

## 2022-02-28 DIAGNOSIS — E785 Hyperlipidemia, unspecified: Secondary | ICD-10-CM | POA: Diagnosis present

## 2022-02-28 DIAGNOSIS — Z7951 Long term (current) use of inhaled steroids: Secondary | ICD-10-CM | POA: Diagnosis not present

## 2022-02-28 DIAGNOSIS — Z8249 Family history of ischemic heart disease and other diseases of the circulatory system: Secondary | ICD-10-CM

## 2022-02-28 DIAGNOSIS — Z9049 Acquired absence of other specified parts of digestive tract: Secondary | ICD-10-CM

## 2022-02-28 DIAGNOSIS — Z888 Allergy status to other drugs, medicaments and biological substances status: Secondary | ICD-10-CM | POA: Diagnosis not present

## 2022-02-28 DIAGNOSIS — I252 Old myocardial infarction: Secondary | ICD-10-CM

## 2022-02-28 DIAGNOSIS — M5416 Radiculopathy, lumbar region: Secondary | ICD-10-CM | POA: Diagnosis present

## 2022-02-28 DIAGNOSIS — I2510 Atherosclerotic heart disease of native coronary artery without angina pectoris: Secondary | ICD-10-CM | POA: Diagnosis present

## 2022-02-28 DIAGNOSIS — R109 Unspecified abdominal pain: Secondary | ICD-10-CM | POA: Diagnosis present

## 2022-02-28 DIAGNOSIS — G8918 Other acute postprocedural pain: Secondary | ICD-10-CM | POA: Diagnosis present

## 2022-02-28 DIAGNOSIS — R1013 Epigastric pain: Secondary | ICD-10-CM | POA: Diagnosis present

## 2022-02-28 DIAGNOSIS — Z981 Arthrodesis status: Secondary | ICD-10-CM | POA: Diagnosis present

## 2022-02-28 DIAGNOSIS — M47816 Spondylosis without myelopathy or radiculopathy, lumbar region: Secondary | ICD-10-CM | POA: Diagnosis present

## 2022-02-28 DIAGNOSIS — Z955 Presence of coronary angioplasty implant and graft: Secondary | ICD-10-CM

## 2022-02-28 DIAGNOSIS — Z818 Family history of other mental and behavioral disorders: Secondary | ICD-10-CM

## 2022-02-28 DIAGNOSIS — Z79899 Other long term (current) drug therapy: Secondary | ICD-10-CM

## 2022-02-28 DIAGNOSIS — F419 Anxiety disorder, unspecified: Secondary | ICD-10-CM | POA: Diagnosis present

## 2022-02-28 DIAGNOSIS — Z8673 Personal history of transient ischemic attack (TIA), and cerebral infarction without residual deficits: Secondary | ICD-10-CM

## 2022-02-28 DIAGNOSIS — R112 Nausea with vomiting, unspecified: Secondary | ICD-10-CM | POA: Diagnosis present

## 2022-02-28 DIAGNOSIS — Z8619 Personal history of other infectious and parasitic diseases: Secondary | ICD-10-CM | POA: Diagnosis not present

## 2022-02-28 DIAGNOSIS — F319 Bipolar disorder, unspecified: Secondary | ICD-10-CM | POA: Diagnosis present

## 2022-02-28 DIAGNOSIS — Z7982 Long term (current) use of aspirin: Secondary | ICD-10-CM

## 2022-02-28 DIAGNOSIS — F17210 Nicotine dependence, cigarettes, uncomplicated: Secondary | ICD-10-CM | POA: Diagnosis present

## 2022-02-28 DIAGNOSIS — M5030 Other cervical disc degeneration, unspecified cervical region: Secondary | ICD-10-CM | POA: Diagnosis present

## 2022-02-28 DIAGNOSIS — Z96651 Presence of right artificial knee joint: Secondary | ICD-10-CM | POA: Diagnosis present

## 2022-02-28 LAB — COMPREHENSIVE METABOLIC PANEL
ALT: 36 U/L (ref 0–44)
AST: 25 U/L (ref 15–41)
Albumin: 3.6 g/dL (ref 3.5–5.0)
Alkaline Phosphatase: 112 U/L (ref 38–126)
Anion gap: 9 (ref 5–15)
BUN: 12 mg/dL (ref 8–23)
CO2: 24 mmol/L (ref 22–32)
Calcium: 8.9 mg/dL (ref 8.9–10.3)
Chloride: 104 mmol/L (ref 98–111)
Creatinine, Ser: 0.77 mg/dL (ref 0.61–1.24)
GFR, Estimated: >60 mL/min (ref >60)
Glucose, Bld: 106 mg/dL — ABNORMAL HIGH (ref 70–99)
Potassium: 3.7 mmol/L (ref 3.5–5.1)
Sodium: 137 mmol/L (ref 135–145)
Total Bilirubin: 0.6 mg/dL (ref 0.3–1.2)
Total Protein: 7.5 g/dL (ref 6.5–8.1)

## 2022-02-28 LAB — CBC WITH DIFFERENTIAL/PLATELET
Abs Immature Granulocytes: 0.02 10*3/uL (ref 0.00–0.07)
Basophils Absolute: 0 10*3/uL (ref 0.0–0.1)
Basophils Relative: 0 %
Eosinophils Absolute: 0.3 10*3/uL (ref 0.0–0.5)
Eosinophils Relative: 3 %
HCT: 38.7 % — ABNORMAL LOW (ref 39.0–52.0)
Hemoglobin: 13.2 g/dL (ref 13.0–17.0)
Immature Granulocytes: 0 %
Lymphocytes Relative: 20 %
Lymphs Abs: 2 10*3/uL (ref 0.7–4.0)
MCH: 31 pg (ref 26.0–34.0)
MCHC: 34.1 g/dL (ref 30.0–36.0)
MCV: 90.8 fL (ref 80.0–100.0)
Monocytes Absolute: 0.5 10*3/uL (ref 0.1–1.0)
Monocytes Relative: 5 %
Neutro Abs: 7.2 10*3/uL (ref 1.7–7.7)
Neutrophils Relative %: 72 %
Platelets: 316 10*3/uL (ref 150–400)
RBC: 4.26 MIL/uL (ref 4.22–5.81)
RDW: 13 % (ref 11.5–15.5)
WBC: 10 10*3/uL (ref 4.0–10.5)
nRBC: 0 % (ref 0.0–0.2)

## 2022-02-28 LAB — URINALYSIS, ROUTINE W REFLEX MICROSCOPIC
Bacteria, UA: NONE SEEN
Bilirubin Urine: NEGATIVE
Glucose, UA: NEGATIVE mg/dL
Ketones, ur: 5 mg/dL — AB
Leukocytes,Ua: NEGATIVE
Nitrite: NEGATIVE
Protein, ur: NEGATIVE mg/dL
Specific Gravity, Urine: 1.023 (ref 1.005–1.030)
pH: 7 (ref 5.0–8.0)

## 2022-02-28 LAB — LIPASE, BLOOD: Lipase: 40 U/L (ref 11–51)

## 2022-02-28 MED ORDER — ONDANSETRON HCL 4 MG/2ML IJ SOLN
4.0000 mg | Freq: Four times a day (QID) | INTRAMUSCULAR | Status: DC | PRN
  Administered 2022-03-01 – 2022-03-02 (×3): 4 mg via INTRAVENOUS
  Filled 2022-02-28 (×3): qty 2

## 2022-02-28 MED ORDER — IOHEXOL 300 MG/ML  SOLN
100.0000 mL | Freq: Once | INTRAMUSCULAR | Status: AC | PRN
  Administered 2022-02-28: 100 mL via INTRAVENOUS

## 2022-02-28 MED ORDER — POTASSIUM CHLORIDE IN NACL 20-0.9 MEQ/L-% IV SOLN
INTRAVENOUS | Status: DC
  Filled 2022-02-28 (×6): qty 1000

## 2022-02-28 MED ORDER — ACETAMINOPHEN 500 MG PO TABS
1000.0000 mg | ORAL_TABLET | Freq: Four times a day (QID) | ORAL | Status: DC
  Administered 2022-02-28 – 2022-03-02 (×7): 1000 mg via ORAL
  Filled 2022-02-28 (×7): qty 2

## 2022-02-28 MED ORDER — ONDANSETRON 4 MG PO TBDP: 4.0000 mg | ORAL_TABLET | Freq: Four times a day (QID) | ORAL | Status: DC | PRN

## 2022-02-28 MED ORDER — ALBUTEROL SULFATE HFA 108 (90 BASE) MCG/ACT IN AERS
2.0000 | INHALATION_SPRAY | Freq: Four times a day (QID) | RESPIRATORY_TRACT | Status: DC | PRN
Start: 2022-02-28 — End: 2022-02-28

## 2022-02-28 MED ORDER — QUETIAPINE FUMARATE 50 MG PO TABS
200.0000 mg | ORAL_TABLET | Freq: Every day | ORAL | Status: DC
  Administered 2022-02-28 – 2022-03-01 (×2): 200 mg via ORAL
  Filled 2022-02-28 (×2): qty 4

## 2022-02-28 MED ORDER — GABAPENTIN 300 MG PO CAPS
300.0000 mg | ORAL_CAPSULE | Freq: Three times a day (TID) | ORAL | Status: DC
Start: 2022-02-28 — End: 2022-03-02
  Administered 2022-02-28 – 2022-03-02 (×5): 300 mg via ORAL
  Filled 2022-02-28 (×5): qty 1

## 2022-02-28 MED ORDER — ENOXAPARIN SODIUM 40 MG/0.4ML IJ SOSY
40.0000 mg | PREFILLED_SYRINGE | INTRAMUSCULAR | Status: DC
  Administered 2022-02-28 – 2022-03-01 (×2): 40 mg via SUBCUTANEOUS
  Filled 2022-02-28 (×2): qty 0.4

## 2022-02-28 MED ORDER — ALBUTEROL SULFATE (2.5 MG/3ML) 0.083% IN NEBU: 2.5000 mg | INHALATION_SOLUTION | Freq: Four times a day (QID) | RESPIRATORY_TRACT | Status: DC | PRN

## 2022-02-28 MED ORDER — KETOROLAC TROMETHAMINE 15 MG/ML IJ SOLN
15.0000 mg | Freq: Once | INTRAMUSCULAR | Status: AC
  Administered 2022-02-28: 15 mg via INTRAVENOUS
  Filled 2022-02-28: qty 1

## 2022-02-28 MED ORDER — PANTOPRAZOLE SODIUM 40 MG IV SOLR
40.0000 mg | Freq: Every day | INTRAVENOUS | Status: DC
  Administered 2022-02-28 – 2022-03-01 (×2): 40 mg via INTRAVENOUS
  Filled 2022-02-28 (×2): qty 10

## 2022-02-28 MED ORDER — HYDROMORPHONE HCL 1 MG/ML IJ SOLN
1.0000 mg | Freq: Once | INTRAMUSCULAR | Status: AC
  Administered 2022-02-28: 1 mg via INTRAVENOUS
  Filled 2022-02-28: qty 1

## 2022-02-28 MED ORDER — ONDANSETRON HCL 4 MG/2ML IJ SOLN
4.0000 mg | Freq: Once | INTRAMUSCULAR | Status: AC
  Administered 2022-02-28: 4 mg via INTRAVENOUS
  Filled 2022-02-28: qty 2

## 2022-02-28 MED ORDER — OXYCODONE HCL 5 MG PO TABS
5.0000 mg | ORAL_TABLET | ORAL | Status: DC | PRN
  Administered 2022-02-28 – 2022-03-02 (×7): 10 mg via ORAL
  Filled 2022-02-28 (×7): qty 2

## 2022-02-28 MED ORDER — HYDROMORPHONE HCL 1 MG/ML IJ SOLN
1.0000 mg | INTRAMUSCULAR | Status: DC | PRN
  Administered 2022-02-28 – 2022-03-01 (×6): 1 mg via INTRAVENOUS
  Filled 2022-02-28 (×6): qty 1

## 2022-02-28 MED ORDER — HYDROMORPHONE HCL 1 MG/ML IJ SOLN
0.5000 mg | Freq: Once | INTRAMUSCULAR | Status: AC
  Administered 2022-02-28: 0.5 mg via INTRAVENOUS
  Filled 2022-02-28: qty 1

## 2022-02-28 NOTE — H&P (Signed)
Keith Gibbs is an 61 y.o. male.   Chief Complaint: Postoperative abdominal pain HPI: This is a 61 year old gentleman who is postoperative day #11 status post laparoscopic cholecystectomy for symptomatic gallstones by Dr. Cherlyn Roberts.  He reports he was doing well until yesterday when he started having epigastric abdominal pain.  It severely worsened through the night and day.  He had nausea and vomiting.  He now describes a sharp constant pain in the epigastric area.  He underwent a CT scan of his abdomen and pelvis which showed a small fluid collection in the gallbladder wall fossa.  His white blood count and liver function tests are normal.  He denies chest pain or shortness of breath.  He has low back pain and is being worked up by orthopedics for this.  Past Medical History:  Diagnosis Date   Anginal pain ([place])    Anxiety    Arthritis    Bipolar 1 disorder (HCC)    Bursitis    CAD (coronary artery disease)    Chronic pain    COPD (chronic obstructive pulmonary disease) (HCC)    Current use of long term anticoagulation    DAPT (ASA + clopidogrel)   Depression    Diverticulitis    Dyspnea    GERD (gastroesophageal reflux disease)    Grade I diastolic dysfunction    Hepatitis C 2012   No longer has Hep C   HLD (hyperlipidemia)    Hypertension    MI (myocardial infarction) ([place])    Polysubstance abuse (HCC)    cocaine, marijuana, ETOH   PUD (peptic ulcer disease)    S/P angioplasty with stent 06/10/2016   a.) 90% stenosis of pLAD to mLAD - 2.5 x 18 mm Xience Alpine (DES x 1) placed to pLAD   S/P PTCA (percutaneous transluminal coronary angioplasty) 12/04/2019   a.) 60% in stent restenosis of DES to pLAD; LVEF 65%.   Schizophrenia ([place])    Stroke [facility])    Valvular insufficiency    a.) Mild MR, TR, PR; mild to moderate AR on 03/05/2018 TTE    Past Surgical History:  Procedure Laterality Date   ABDOMINAL SURGERY     removed small piece of intestines due to [facility]  Diverticulosis   APPENDECTOMY     BACK SURGERY     CARDIAC CATHETERIZATION Left 06/10/2016   Procedure: Left Heart Cath and Coronary Angiography;  Surgeon: Dionisio David, MD;  Location: [place] [facility];  Service: Cardiovascular;  Laterality: Left;   CARDIAC CATHETERIZATION N/A 06/10/2016   Procedure: Coronary Stent Intervention;  Surgeon: Yolonda Kida, MD;  Location: [place] [facility];  Service: Cardiovascular;  Laterality: N/A;   CHOLECYSTECTOMY N/A 02/17/2022   Procedure: LAPAROSCOPIC CHOLECYSTECTOMY;  Surgeon: Kieth Brightly Arta Bruce, MD;  Location: [place];  Service: General;  Laterality: N/A;   COLON SURGERY     COLONOSCOPY     COLONOSCOPY WITH PROPOFOL N/A 01/05/2017   Procedure: COLONOSCOPY WITH PROPOFOL;  Surgeon: Jonathon Bellows, MD;  Location: [facility] ENDOSCOPY;  Service: Endoscopy;  Laterality: N/A;   COLONOSCOPY WITH PROPOFOL N/A 02/13/2020   Procedure: COLONOSCOPY WITH PROPOFOL;  Surgeon: Virgel Manifold, MD;  Location: [facility] ENDOSCOPY;  Service: Endoscopy;  Laterality: N/A;   CORONARY ANGIOPLASTY WITH STENT PLACEMENT     ESOPHAGOGASTRODUODENOSCOPY (EGD) WITH PROPOFOL N/A 01/05/2017   Procedure: ESOPHAGOGASTRODUODENOSCOPY (EGD) WITH PROPOFOL;  Surgeon: Jonathon Bellows, MD;  Location: [facility] ENDOSCOPY;  Service: Endoscopy;  Laterality: N/A;   ESOPHAGOGASTRODUODENOSCOPY (EGD) WITH PROPOFOL N/A 02/13/2020  Procedure: ESOPHAGOGASTRODUODENOSCOPY (EGD) WITH PROPOFOL;  Surgeon: Virgel Manifold, MD;  Location: [facility] ENDOSCOPY;  Service: Endoscopy;  Laterality: N/A;   INTRAVASCULAR PRESSURE WIRE/FFR STUDY N/A 12/04/2019   Procedure: INTRAVASCULAR PRESSURE WIRE/FFR STUDY;  Surgeon: Sherren Mocha, MD;  Location: [place] [facility];  Service: Cardiovascular;  Laterality: N/A;   KNEE ARTHROSCOPY WITH MEDIAL MENISECTOMY Right 09/05/2017   Procedure: KNEE ARTHROSCOPY WITH MEDIAL AND LATERAL  MENISECTOMY PARTIAL SYNOVECTOMY;  Surgeon: Hessie Knows, MD;  Location: [facility] ORS;  Service:  Orthopedics;  Laterality: Right;   LEFT HEART CATH AND CORONARY ANGIOGRAPHY N/A 12/04/2019   Procedure: LEFT HEART CATH AND CORONARY ANGIOGRAPHY;  Surgeon: Sherren Mocha, MD;  Location: [place] [facility];  Service: Cardiovascular;  Laterality: N/A;   SHOULDER SURGERY Right 04/09/2012   SPINE SURGERY     TOTAL KNEE ARTHROPLASTY Right 01/19/2021   Procedure: TOTAL KNEE ARTHROPLASTY - Rachelle Hora to Assist;  Surgeon: Hessie Knows, MD;  Location: [facility] ORS;  Service: Orthopedics;  Laterality: Right;    Family History  Problem Relation Age of Onset   Osteoarthritis Mother    Heart disease Mother    Hypertension Mother    Depression Mother    Heart disease Father    Early death Father    Hypertension Father    Heart attack Father    Hypertension Sister    Prostate cancer Neg Hx    Bladder Cancer Neg Hx    Kidney cancer Neg Hx    Social History:  reports that he has been smoking cigarettes. He has a 16.00 pack-year smoking history. He has never used smokeless tobacco. He reports current alcohol use of about 6.0 standard drinks of alcohol per week. He reports that he does not currently use drugs after having used the following drugs: Cocaine and Marijuana.  Allergies:  Allergies  Allergen Reactions   Latuda [Lurasidone Hcl] Other (See Comments)    Tremors     Saphris [Asenapine] Other (See Comments)    Increased tremors     (Not in a hospital admission)   Results for orders placed or performed during the hospital encounter of 02/28/22 (from the past 48 hour(s))  Urinalysis, Routine w reflex microscopic Urine, Clean Catch     Status: Abnormal   Collection Time: 02/28/22  3:13 PM  Result Value Ref Range   Color, Urine YELLOW YELLOW   APPearance CLEAR CLEAR   Specific Gravity, Urine 1.023 1.005 - 1.030   pH 7.0 5.0 - 8.0   Glucose, UA NEGATIVE NEGATIVE mg/dL   Hgb urine dipstick SMALL (A) NEGATIVE   Bilirubin Urine NEGATIVE NEGATIVE   Ketones, ur 5 (A) NEGATIVE mg/dL    Protein, ur NEGATIVE NEGATIVE mg/dL   Nitrite NEGATIVE NEGATIVE   Leukocytes,Ua NEGATIVE NEGATIVE   RBC / HPF 11-20 0 - 5 RBC/hpf   WBC, UA 0-5 0 - 5 WBC/hpf   Bacteria, UA NONE SEEN NONE SEEN   Mucus PRESENT     Comment: Performed at [facility], [place] [address], [place], [place] [address]  Comprehensive metabolic panel     Status: Abnormal   Collection Time: 02/28/22  3:44 PM  Result Value Ref Range   Sodium 137 135 - 145 mmol/L   Potassium 3.7 3.5 - 5.1 mmol/L   Chloride 104 98 - 111 mmol/L   CO2 24 22 - 32 mmol/L   Glucose, Bld 106 (H) 70 - 99 mg/dL    Comment: Glucose reference range applies only to samples taken after  fasting for at least 8 hours.   BUN 12 8 - 23 mg/dL   Creatinine, Ser 0.77 0.61 - 1.24 mg/dL   Calcium 8.9 8.9 - 10.3 mg/dL   Total Protein 7.5 6.5 - 8.1 g/dL   Albumin 3.6 3.5 - 5.0 g/dL   AST 25 15 - 41 U/L   ALT 36 0 - 44 U/L   Alkaline Phosphatase 112 38 - 126 U/L   Total Bilirubin 0.6 0.3 - 1.2 mg/dL   GFR, Estimated >60 >60 mL/min    Comment: (NOTE) Calculated using the CKD-EPI Creatinine Equation (2021)    Anion gap 9 5 - 15    Comment: Performed at [facility], [place] [address], [place], [place] [address]  CBC with Differential     Status: Abnormal   Collection Time: 02/28/22  3:44 PM  Result Value Ref Range   WBC 10.0 4.0 - 10.5 K/uL   RBC 4.26 4.22 - 5.81 MIL/uL   Hemoglobin 13.2 13.0 - 17.0 g/dL   HCT 38.7 (L) 39.0 - 52.0 %   MCV 90.8 80.0 - 100.0 fL   MCH 31.0 26.0 - 34.0 pg   MCHC 34.1 30.0 - 36.0 g/dL   RDW 13.0 11.5 - 15.5 %   Platelets 316 150 - 400 K/uL   nRBC 0.0 0.0 - 0.2 %   Neutrophils Relative % 72 %   Neutro Abs 7.2 1.7 - 7.7 K/uL   Lymphocytes Relative 20 %   Lymphs Abs 2.0 0.7 - 4.0 K/uL   Monocytes Relative 5 %   Monocytes Absolute 0.5 0.1 - 1.0 K/uL   Eosinophils Relative 3 %   Eosinophils Absolute 0.3 0.0 - 0.5 K/uL   Basophils Relative 0 %   Basophils Absolute 0.0 0.0 - 0.1 K/uL    Immature Granulocytes 0 %   Abs Immature Granulocytes 0.02 0.00 - 0.07 K/uL    Comment: Performed at [facility], [place] [address], [place], [place] [address]  Lipase, blood     Status: None   Collection Time: 02/28/22  3:44 PM  Result Value Ref Range   Lipase 40 11 - 51 U/L    Comment: Performed at [facility], [place] [address] [place], [place] [address]   CT ABDOMEN PELVIS W CONTRAST  Result Date: 02/28/2022 CLINICAL DATA:  Abdominal pain, post-op. Pt complains of generalized abdominal pain and lumbar back pain. Pain started 2 days ago, has been constant and getting progressively worse. Patient endorses vomiting while in triage area. Patient also reports his dysuria today and chills. Cholecystectomy 02/17/2022 EXAM: CT ABDOMEN AND PELVIS WITH CONTRAST TECHNIQUE: Multidetector CT imaging of the abdomen and pelvis was performed using the standard protocol following bolus administration of intravenous contrast. RADIATION DOSE REDUCTION: This exam was performed according to the departmental dose-optimization program which includes automated exposure control, adjustment of the mA and/or kV according to patient size and/or use of iterative reconstruction technique. CONTRAST:  144m OMNIPAQUE IOHEXOL 300 MG/ML  SOLN COMPARISON:  CT angiography chest, abdomen, pelvis 12/16/2021. Ultrasound abdomen 02/14/2022, ultrasound abdomen 02/17/2022 FINDINGS: Lower chest: Couple nodular-like ground-glass airspace opacities of the basilar right lower lobe measuring 1.5 x 0.9 and 1.3 x 0.9 cm. Hepatobiliary: Redemonstration of subcentimeter hypodensity too small to characterize. Status post cholecystectomy. Vague density within the gallbladder fossa measuring approximately 2.9 by 2.5 cm. No biliary dilatation. Pancreas: No focal lesion. Normal pancreatic contour. No surrounding inflammatory changes. No main pancreatic ductal dilatation. Spleen: Normal in size without  focal abnormality.  Adrenals/Urinary Tract: No adrenal nodule bilaterally. Bilateral kidneys enhance symmetrically. No hydronephrosis. No hydroureter. The urinary bladder is unremarkable. Stomach/Bowel: Stomach is within normal limits. No evidence of bowel wall thickening or dilatation. Likely second portion of the duodenum diverticula (5:59). Few scattered colonic diverticula. The appendix is not definitely identified with no inflammatory changes in the right lower quadrant to suggest acute appendicitis. Vascular/Lymphatic: No abdominal aorta or iliac aneurysm. Mild atherosclerotic plaque of the aorta and its branches. No abdominal, pelvic, or inguinal lymphadenopathy. Reproductive: Prostate is unremarkable. Other: No intraperitoneal free fluid. No intraperitoneal free gas. No organized fluid collection. Musculoskeletal: No abdominal wall hernia or abnormality. No suspicious lytic or blastic osseous lesions. No acute displaced fracture. L4-S1 posterolateral and interbody fusion surgical hardware associated laminectomies. IMPRESSION: 1. Vague density within the gallbladder fossa measuring approximately 2.9 x 2.5 cm in a patient status post cholecystectomy. Finding could represent developing abscess versus tiny biloma. 2. Couple nodular-like ground-glass airspace opacities of the basilar right lower lobe measuring 1.5 and 1.3 cm. Finding could represent infection versus inflammation. Given nodular-like morphology, consider CT follow-up in 6 months. 3. Colonic diverticulosis with no acute diverticulitis. 4.  Aortic Atherosclerosis (ICD10-I70.0). Electronically Signed   By: Iven Finn M.D.   On: 02/28/2022 17:04    Review of Systems  All other systems reviewed and are negative.   Blood pressure (!) 190/86, pulse (!) 49, temperature 98.1 F (36.7 C), temperature source Oral, resp. rate 14, height '6\' 3"'$  (1.905 m), weight 95 kg, SpO2 100 %. Physical Exam Constitutional:      Comments: Appears uncomfortable lying on his side   HENT:     Head: Normocephalic and atraumatic.  Eyes:     General: No scleral icterus. Cardiovascular:     Rate and Rhythm: Normal rate and regular rhythm.  Pulmonary:     Effort: Pulmonary effort is normal.     Breath sounds: Normal breath sounds. No stridor.  Abdominal:     General: Abdomen is flat. There is no distension.     Hernia: No hernia is present.     Comments: His abdomen is very soft.  There is bruising at the umbilicus.  His pain seems out of proportion to examination with minimal tenderness and guarding when distracted  Skin:    General: Skin is warm and dry.     Coloration: Skin is not jaundiced.  Neurological:     General: No focal deficit present.     Mental Status: He is alert.  Psychiatric:        Mood and Affect: Mood is anxious.      Assessment/Plan Postoperative abdominal pain, POD #11 status post lap chole  I reviewed his CAT scan of the abdomen and pelvis.  There is a almost 3 cm fluid collection in gallbladder fossa which does not seem very impressive.  His pain severity seems out of proportion to his physical examination.  He says he has not been taking any anti-inflammatory medications.  There is no evidence of perforation on CT scan.  Again, his white blood count and liver function tests are normal.  I doubt this is an abscess but I cannot completely rule out a bile leak.  Plan will be to admit the patient for IV fluids, rehydration, and then a HIDA scan to evaluate for a bile leak.  With a normal white blood count, I do not plan on starting antibiotics at this point.  He will also be placed on IV Protonix.  The patient agrees with plans.  Coralie Keens, MD 02/28/2022, 6:36 PM

## 2022-02-28 NOTE — ED Provider Notes (Signed)
[facility] DEPT Provider Note   CSN: [id number] Arrival date & time: 02/28/22  1442     History  Chief Complaint  Patient presents with   Abdominal Pain    Keith Gibbs is a 61 y.o. male.   Abdominal Pain Patient resents abdominal pain.  Low back pain.  Started 2 days ago.  Recent cholecystectomy within the last 2-weeks states pain is in the low back rating of the leg and also in the abdomen.  States entire abdomen is sore.  Had been seen by Ortho for lumbar radiculopathy about 5 days ago and has an MRI for the lumbar spine scheduled.  Has been on pain meds without relief.  Does have previous history of opiate abuse.    Past Medical History:  Diagnosis Date   Anginal pain ([place])    Anxiety    Arthritis    Bipolar 1 disorder (HCC)    Bursitis    CAD (coronary artery disease)    Chronic pain    COPD (chronic obstructive pulmonary disease) (HCC)    Current use of long term anticoagulation    DAPT (ASA + clopidogrel)   Depression    Diverticulitis    Dyspnea    GERD (gastroesophageal reflux disease)    Grade I diastolic dysfunction    Hepatitis C 2012   No longer has Hep C   HLD (hyperlipidemia)    Hypertension    MI (myocardial infarction) ([place])    Polysubstance abuse (HCC)    cocaine, marijuana, ETOH   PUD (peptic ulcer disease)    S/P angioplasty with stent 06/10/2016   a.) 90% stenosis of pLAD to mLAD - 2.5 x 18 mm Xience Alpine (DES x 1) placed to pLAD   S/P PTCA (percutaneous transluminal coronary angioplasty) 12/04/2019   a.) 60% in stent restenosis of DES to pLAD; LVEF 65%.   Schizophrenia ([place])    Stroke [facility])    Valvular insufficiency    a.) Mild MR, TR, PR; mild to moderate AR on 03/05/2018 TTE    Home Medications Prior to Admission medications   Medication Sig Start Date End Date Taking? Authorizing Provider  acetaminophen (TYLENOL) 500 MG tablet Take 2 tablets (1,000 mg total) by mouth every 6 (six) hours. Patient  taking differently: Take 1,000 mg by mouth daily as needed for headache. 01/20/21   Duanne Guess, PA-C  albuterol (VENTOLIN HFA) 108 (90 Base) MCG/ACT inhaler Inhale 2 puffs into the lungs every 6 (six) hours as needed for wheezing or shortness of breath. 05/28/19   Charlott Rakes, MD  aspirin EC 81 MG tablet Take 1 tablet (81 mg total) by mouth daily. Swallow whole. 01/03/22   Gildardo Pounds, NP  atorvastatin (LIPITOR) 80 MG tablet Take 80 mg by mouth daily. 08/28/20   [provider]  azelastine (ASTELIN) 0.1 % nasal spray Place 1 spray into both nostrils 2 (two) times daily. Use in each nostril as directed    [provider]  Budeson-Glycopyrrol-Formoterol (BREZTRI AEROSPHERE) 160-9-4.8 MCG/ACT AERO Take 2 puffs first thing in am and then another 2 puffs about 12 hours later. 12/14/21   Tanda Rockers, MD  busPIRone (BUSPAR) 15 MG tablet Take 1 tablet (15 mg total) by mouth 2 (two) times daily. 01/03/22   Gildardo Pounds, NP  cetirizine (ZYRTEC) 10 MG tablet TAKE 1 TABLET BY MOUTH EVERY DAY 02/23/22   Gildardo Pounds, NP  cyclobenzaprine (FLEXERIL) 10 MG tablet Take 1 tablet (10 mg  total) by mouth daily as needed for muscle spasms. 02/18/22   Meuth, Blaine Hamper, PA-C  diclofenac (VOLTAREN) 75 MG EC tablet SMARTSIG:1 Tablet(s) By Mouth Every 12 Hours 01/18/22   [provider]  divalproex (DEPAKOTE) 250 MG DR tablet Take 1 tablet (250 mg total) by mouth 2 (two) times daily. 01/10/22 01/10/23  Freida Busman, MD  fluticasone (FLONASE) 50 MCG/ACT nasal spray Place 1 spray into both nostrils at bedtime.    [provider]  gabapentin (NEURONTIN) 300 MG capsule Take 300 mg by mouth 3 (three) times daily. 12/22/21   [provider]  Multiple Vitamin (MULTIVITAMIN) tablet Take 1 tablet by mouth daily.    [provider]  nitroGLYCERIN (NITROSTAT) 0.3 MG SL tablet Place 0.3 mg under the tongue every 5 (five) minutes as needed for chest pain. 12/01/20    [provider]  Omega-3 Fatty Acids (FISH OIL) 1200 MG CAPS Take 1,200 mg by mouth daily.    [provider]  omeprazole (PRILOSEC) 40 MG capsule TAKE 1 CAPSULE (40 MG TOTAL) BY MOUTH DAILY. 02/23/22   Charlott Rakes, MD  oxyCODONE 10 MG TABS Take 0.5-1 tablets (5-10 mg total) by mouth every 6 (six) hours as needed for moderate pain or severe pain ('5mg'$  moderate, '10mg'$  severe). 02/18/22   Meuth, Brooke A, PA-C  polyethylene glycol (MIRALAX / GLYCOLAX) 17 g packet Take 17 g by mouth daily as needed for mild constipation. 02/18/22   Meuth, Brooke A, PA-C  QUEtiapine (SEROQUEL) 200 MG tablet Take 1 tablet (200 mg total) by mouth at bedtime. 01/10/22 01/10/23  Freida Busman, MD  rOPINIRole (REQUIP) 2 MG tablet TAKE 1 TABLET BY MOUTH EVERYDAY AT BEDTIME 02/23/22   Gildardo Pounds, NP  sertraline (ZOLOFT) 25 MG tablet Take 1 tablet (25 mg total) by mouth daily. 01/24/22 01/24/23  Freida Busman, MD  tamsulosin (FLOMAX) 0.4 MG CAPS capsule TAKE 1 CAPSULE BY MOUTH EVERY DAY 02/23/22   Charlott Rakes, MD      Allergies    Anette Guarneri [lurasidone hcl] and Saphris [asenapine]    Review of Systems   Review of Systems  Gastrointestinal:  Positive for abdominal pain.    Physical Exam Updated Vital Signs BP (!) 188/88   Pulse (!) 52   Temp 98.1 F (36.7 C) (Oral)   Resp (!) 22   Ht '6\' 3"'$  (1.905 m)   Wt 95 kg   SpO2 94%   BMI 26.18 kg/m  Physical Exam Vitals and nursing note reviewed.  Constitutional:      Comments: Laying on his right side in bed.  HENT:     Head: Normocephalic.  Cardiovascular:     Rate and Rhythm: Normal rate.  Pulmonary:     Effort: Pulmonary effort is normal.  Abdominal:     Tenderness: There is abdominal tenderness.     Comments: Somewhat diffuse tenderness but worse in lower abdomen.  Skin:    General: Skin is warm.  Neurological:     Mental Status: He is alert.   Some lumbar tenderness particularly left lower.  Ulcer tenderness left SI joint area.  Pain  with straight leg raise on the left.  No rash.  ED Results / Procedures / Treatments   Labs (all labs ordered are listed, but only abnormal results are displayed) Labs Reviewed  URINALYSIS, ROUTINE W REFLEX MICROSCOPIC - Abnormal; Notable for the following components:      Result Value   Hgb urine dipstick SMALL (*)  Ketones, ur 5 (*)    All other components within normal limits  COMPREHENSIVE METABOLIC PANEL - Abnormal; Notable for the following components:   Glucose, Bld 106 (*)    All other components within normal limits  CBC WITH DIFFERENTIAL/PLATELET - Abnormal; Notable for the following components:   HCT 38.7 (*)    All other components within normal limits  URINE CULTURE  LIPASE, BLOOD    EKG None  Radiology CT ABDOMEN PELVIS W CONTRAST  Result Date: 02/28/2022 CLINICAL DATA:  Abdominal pain, post-op. Pt complains of generalized abdominal pain and lumbar back pain. Pain started 2 days ago, has been constant and getting progressively worse. Patient endorses vomiting while in triage area. Patient also reports his dysuria today and chills. Cholecystectomy 02/17/2022 EXAM: CT ABDOMEN AND PELVIS WITH CONTRAST TECHNIQUE: Multidetector CT imaging of the abdomen and pelvis was performed using the standard protocol following bolus administration of intravenous contrast. RADIATION DOSE REDUCTION: This exam was performed according to the departmental dose-optimization program which includes automated exposure control, adjustment of the mA and/or kV according to patient size and/or use of iterative reconstruction technique. CONTRAST:  117m OMNIPAQUE IOHEXOL 300 MG/ML  SOLN COMPARISON:  CT angiography chest, abdomen, pelvis 12/16/2021. Ultrasound abdomen 02/14/2022, ultrasound abdomen 02/17/2022 FINDINGS: Lower chest: Couple nodular-like ground-glass airspace opacities of the basilar right lower lobe measuring 1.5 x 0.9 and 1.3 x 0.9 cm. Hepatobiliary: Redemonstration of subcentimeter  hypodensity too small to characterize. Status post cholecystectomy. Vague density within the gallbladder fossa measuring approximately 2.9 by 2.5 cm. No biliary dilatation. Pancreas: No focal lesion. Normal pancreatic contour. No surrounding inflammatory changes. No main pancreatic ductal dilatation. Spleen: Normal in size without focal abnormality. Adrenals/Urinary Tract: No adrenal nodule bilaterally. Bilateral kidneys enhance symmetrically. No hydronephrosis. No hydroureter. The urinary bladder is unremarkable. Stomach/Bowel: Stomach is within normal limits. No evidence of bowel wall thickening or dilatation. Likely second portion of the duodenum diverticula (5:59). Few scattered colonic diverticula. The appendix is not definitely identified with no inflammatory changes in the right lower quadrant to suggest acute appendicitis. Vascular/Lymphatic: No abdominal aorta or iliac aneurysm. Mild atherosclerotic plaque of the aorta and its branches. No abdominal, pelvic, or inguinal lymphadenopathy. Reproductive: Prostate is unremarkable. Other: No intraperitoneal free fluid. No intraperitoneal free gas. No organized fluid collection. Musculoskeletal: No abdominal wall hernia or abnormality. No suspicious lytic or blastic osseous lesions. No acute displaced fracture. L4-S1 posterolateral and interbody fusion surgical hardware associated laminectomies. IMPRESSION: 1. Vague density within the gallbladder fossa measuring approximately 2.9 x 2.5 cm in a patient status post cholecystectomy. Finding could represent developing abscess versus tiny biloma. 2. Couple nodular-like ground-glass airspace opacities of the basilar right lower lobe measuring 1.5 and 1.3 cm. Finding could represent infection versus inflammation. Given nodular-like morphology, consider CT follow-up in 6 months. 3. Colonic diverticulosis with no acute diverticulitis. 4.  Aortic Atherosclerosis (ICD10-I70.0). Electronically Signed   By: MIven Finn M.D.   On: 02/28/2022 17:04    Procedures Procedures    Medications Ordered in ED Medications  ketorolac (TORADOL) 15 MG/ML injection 15 mg (15 mg Intravenous Given 02/28/22 1605)  HYDROmorphone (DILAUDID) injection 0.5 mg (0.5 mg Intravenous Given 02/28/22 1606)  iohexol (OMNIPAQUE) 300 MG/ML solution 100 mL (100 mLs Intravenous Contrast Given 02/28/22 1634)  ondansetron (ZOFRAN) injection 4 mg (4 mg Intravenous Given 02/28/22 1734)  HYDROmorphone (DILAUDID) injection 1 mg (1 mg Intravenous Given 02/28/22 1735)    ED Course/ Medical Decision Making/ A&P  Medical Decision Making Risk Prescription drug management.   I reviewed surgical note and Ortho notes.  Reviewed drug database. Has abdominal and back pain.  Has had back pain for a while including predating the surgery.  Worsening.  Has MRI scheduled for an outpatient.  No fevers.  Differential diagnosis includes intra-abdominal pathology, but also lumbar/radicular issues.  Has MRI scheduled in 2 days.  Will treat pain meds here get blood work and CT scan of abdomen. Lab work overall reassuring, however continued abdominal pain and vomiting.  CT scan showed fluid collection near gallbladder fossa.  Read as abscess versus biloma.  I have independently interpreted the CT also.  Discussed with Dr. Ninfa Linden from general surgery.  Will admit patient for further workup.        Final Clinical Impression(s) / ED Diagnoses Final diagnoses:  Postoperative pain    Rx / DC Orders ED Discharge Orders     None         Davonna Belling, MD 02/28/22 1816

## 2022-02-28 NOTE — Telephone Encounter (Signed)
He did not keep his preop appt so I can't technically clear him and he is at risk of pulmonary complications - see if he can be seen this week in [place] or [place] - if procedure becomes more urgent he and his fm will need to acknowledge/ accept the higher risk

## 2022-02-28 NOTE — ED Triage Notes (Signed)
Pt arrives via EMS from home with lower back and abd pain that started 2 days ago. Gallbladder removed a week ago. Tender to palpitation. Reports dark urine today. 4 mg zofran and 50 mcg fentanyl given by EMS.

## 2022-02-28 NOTE — ED Provider Triage Note (Signed)
Emergency Medicine Provider Triage Evaluation Note  Keith Gibbs , a 61 y.o. male  was evaluated in triage.  Pt complains of generalized abdominal pain and lumbar back pain.  Pain started 2 days ago, has been constant and getting progressively worse.  Patient endorses vomiting while in triage area.  Patient also reports his dysuria today and chills  Review of Systems  Positive: Dysuria, chills, abdominal pain, back pain Negative: Fever, constipation, diarrhea, blood in stool, melena, hematuria, urinary urgency, hematemesis, coffee-ground emesis  Physical Exam  BP (!) 162/85   Pulse (!) 49   Temp 98.1 F (36.7 C) (Oral)   Resp 20   Ht '6\' 3"'$  (1.905 m)   Wt 95 kg   SpO2 100%   BMI 26.18 kg/m  Gen:   Awake, no distress   Resp:  Normal effort  MSK:   Moves extremities without difficulty  Other:  Abdomen soft, nondistended, generalized tenderness throughout.  Ecchymosis noted around periumbilical region.  Surgical scars clean, dry, and intact with no surrounding erythema or purulent discharge  Medical Decision Making  Medically screening exam initiated at 3:15 PM.  Appropriate orders placed.  Katsumi Wisler Eckhardt was informed that the remainder of the evaluation will be completed by another provider, this initial triage assessment does not replace that evaluation, and the importance of remaining in the ED until their evaluation is complete.     Loni Beckwith, [place] 02/28/22 1516

## 2022-02-28 NOTE — Telephone Encounter (Signed)
Can we call patient to schedule him a surgical clearance appt with either Dr Melvyn Novas or an NP. His surgery is pending so it does not need to be ASAP.   Thank you

## 2022-02-28 NOTE — Telephone Encounter (Signed)
Can we call this patient and get his rescheduled to be cleared for surgery. Dr Melvyn Novas states he canceled his appointment for today and we can not clear him for surgery until he is seen.  Thank you

## 2022-03-01 ENCOUNTER — Inpatient Hospital Stay (HOSPITAL_COMMUNITY): Payer: Medicaid Other

## 2022-03-01 LAB — COMPREHENSIVE METABOLIC PANEL
ALT: 29 U/L (ref 0–44)
AST: 19 U/L (ref 15–41)
Albumin: 3.2 g/dL — ABNORMAL LOW (ref 3.5–5.0)
Alkaline Phosphatase: 89 U/L (ref 38–126)
Anion gap: 9 (ref 5–15)
BUN: 13 mg/dL (ref 8–23)
CO2: 22 mmol/L (ref 22–32)
Calcium: 8.5 mg/dL — ABNORMAL LOW (ref 8.9–10.3)
Chloride: 110 mmol/L (ref 98–111)
Creatinine, Ser: 0.73 mg/dL (ref 0.61–1.24)
GFR, Estimated: >60 mL/min (ref >60)
Glucose, Bld: 92 mg/dL (ref 70–99)
Potassium: 3.2 mmol/L — ABNORMAL LOW (ref 3.5–5.1)
Sodium: 141 mmol/L (ref 135–145)
Total Bilirubin: 0.7 mg/dL (ref 0.3–1.2)
Total Protein: 6.4 g/dL — ABNORMAL LOW (ref 6.5–8.1)

## 2022-03-01 LAB — CBC
HCT: 36.4 % — ABNORMAL LOW (ref 39.0–52.0)
Hemoglobin: 12.1 g/dL — ABNORMAL LOW (ref 13.0–17.0)
MCH: 31 pg (ref 26.0–34.0)
MCHC: 33.2 g/dL (ref 30.0–36.0)
MCV: 93.3 fL (ref 80.0–100.0)
Platelets: 272 10*3/uL (ref 150–400)
RBC: 3.9 MIL/uL — ABNORMAL LOW (ref 4.22–5.81)
RDW: 13.1 % (ref 11.5–15.5)
WBC: 12.5 10*3/uL — ABNORMAL HIGH (ref 4.0–10.5)
nRBC: 0 % (ref 0.0–0.2)

## 2022-03-01 MED ORDER — POTASSIUM CHLORIDE CRYS ER 20 MEQ PO TBCR
40.0000 meq | EXTENDED_RELEASE_TABLET | Freq: Once | ORAL | Status: AC
  Administered 2022-03-01: 40 meq via ORAL
  Filled 2022-03-01: qty 2

## 2022-03-01 MED ORDER — BUDESON-GLYCOPYRROL-FORMOTEROL 160-9-4.8 MCG/ACT IN AERO: 2.0000 | INHALATION_SPRAY | Freq: Two times a day (BID) | RESPIRATORY_TRACT | Status: DC

## 2022-03-01 MED ORDER — ROPINIROLE HCL 1 MG PO TABS
2.0000 mg | ORAL_TABLET | Freq: Every day | ORAL | Status: DC
  Administered 2022-03-01: 2 mg via ORAL
  Filled 2022-03-01: qty 2

## 2022-03-01 MED ORDER — TECHNETIUM TC 99M MEBROFENIN IV KIT
5.1000 | PACK | Freq: Once | INTRAVENOUS | Status: AC
  Administered 2022-03-01: 5.1 via INTRAVENOUS

## 2022-03-01 MED ORDER — DIVALPROEX SODIUM 250 MG PO DR TAB
250.0000 mg | DELAYED_RELEASE_TABLET | Freq: Two times a day (BID) | ORAL | Status: DC
  Administered 2022-03-01 – 2022-03-02 (×3): 250 mg via ORAL
  Filled 2022-03-01 (×3): qty 1

## 2022-03-01 MED ORDER — UMECLIDINIUM BROMIDE 62.5 MCG/ACT IN AEPB
1.0000 | INHALATION_SPRAY | Freq: Every day | RESPIRATORY_TRACT | Status: DC
  Administered 2022-03-02: 1 via RESPIRATORY_TRACT
  Filled 2022-03-01: qty 7

## 2022-03-01 MED ORDER — FLUTICASONE FUROATE-VILANTEROL 200-25 MCG/ACT IN AEPB
1.0000 | INHALATION_SPRAY | Freq: Every day | RESPIRATORY_TRACT | Status: DC
Start: 2022-03-01 — End: 2022-03-02
  Administered 2022-03-02: 1 via RESPIRATORY_TRACT
  Filled 2022-03-01: qty 28

## 2022-03-01 MED ORDER — IBUPROFEN 400 MG PO TABS
400.0000 mg | ORAL_TABLET | Freq: Three times a day (TID) | ORAL | Status: DC
  Administered 2022-03-01 – 2022-03-02 (×3): 400 mg via ORAL
  Filled 2022-03-01 (×3): qty 1

## 2022-03-01 MED ORDER — BUSPIRONE HCL 5 MG PO TABS
15.0000 mg | ORAL_TABLET | Freq: Two times a day (BID) | ORAL | Status: DC
  Administered 2022-03-01 – 2022-03-02 (×3): 15 mg via ORAL
  Filled 2022-03-01 (×3): qty 3

## 2022-03-01 MED ORDER — BOOST / RESOURCE BREEZE PO LIQD CUSTOM
1.0000 | Freq: Three times a day (TID) | ORAL | Status: DC
  Administered 2022-03-01: 1 via ORAL

## 2022-03-01 MED ORDER — CYCLOBENZAPRINE HCL 5 MG PO TABS: 10.0000 mg | ORAL_TABLET | Freq: Every day | ORAL | Status: DC | PRN

## 2022-03-01 MED ORDER — TAMSULOSIN HCL 0.4 MG PO CAPS
0.4000 mg | ORAL_CAPSULE | Freq: Every day | ORAL | Status: DC
  Administered 2022-03-01 – 2022-03-02 (×2): 0.4 mg via ORAL
  Filled 2022-03-01 (×2): qty 1

## 2022-03-01 NOTE — TOC Progression Note (Signed)
Transition of Care (TOC) - Progression Note   Transition of Care (TOC) Screening Note  Patient Details  Name: Keith Gibbs Date of Birth: [birth year]  Transition of Care [facility]) CM/SW Contact:    Sherie Don, LCSW Phone Number: 03/01/2022, 11:27 AM  Transition of Care Department [facility]) has reviewed patient and no TOC needs have been identified at this time. We will continue to monitor patient advancement through interdisciplinary progression rounds. If new patient transition needs arise, please place a TOC consult.    Readmission Risk Interventions    03/01/2022   11:27 AM  Readmission Risk Prevention Plan  Transportation Screening Complete  HRI or Home Care Consult Complete  Social Work Consult for [place] Planning/Counseling Complete  Palliative Care Screening Not Applicable  Medication Review [employer]) Complete

## 2022-03-01 NOTE — Progress Notes (Signed)
Initial Nutrition Assessment  INTERVENTION:   Once diet advanced: -Boost Breeze po TID, each supplement provides 250 kcal and 9 grams of protein  NUTRITION DIAGNOSIS:   Increased nutrient needs related to post-op healing, acute illness as evidenced by estimated needs.  GOAL:   Patient will meet greater than or equal to 90% of their needs  MONITOR:   PO intake, Supplement acceptance, Labs, Weight trends, I & O's  REASON FOR ASSESSMENT:   Malnutrition Screening Tool    ASSESSMENT:   61 year old gentleman who is postoperative day #11 status post laparoscopic cholecystectomy for symptomatic gallstones by Dr. Cherlyn Roberts.  Admitted for N/V and abdominal pain.  Patient not in room at time of visit.  Per surgery note, plan is for HIDA scan.  Pt is s/p lap cholecystectomy on 7/27. Pt reporting abdominal pain and N/V. Recommend Boost Breeze once diet is advanced.  Per weight records, pt has lost 13 lbs since 4/21 (5% wt loss x 3.5 months, insignificant for time frame).  Medications: KLOR-CON, IV Zofran   Labs reviewed: Low K  NUTRITION - FOCUSED PHYSICAL EXAM:  Unable to complete  Diet Order:   Diet Order             Diet NPO time specified Except for: Ice Chips, Sips with Meds  Diet effective now                   EDUCATION NEEDS:   No education needs have been identified at this time  Skin:  Skin Assessment: Skin Integrity Issues: Skin Integrity Issues:: Incisions Incisions: 7/27: abdomen  Last BM:  8/7  Height:   Ht Readings from Last 1 Encounters:  02/28/22 '6\' 3"'$  (1.905 m)    Weight:   Wt Readings from Last 1 Encounters:  02/28/22 95 kg    BMI:  Body mass index is 26.18 kg/m.  Estimated Nutritional Needs:   Kcal:  2200-2400  Protein:  100-115g  Fluid:  2.2L/day  Keith Bibles, MS, RD, LDN Inpatient Clinical Dietitian Contact information available via Amion

## 2022-03-01 NOTE — Progress Notes (Signed)
Subjective: Still with some diffuse abdominal pain, certainly not focal in RUQ.  No nausea or any other complaints.   Objective: Vital signs in last 24 hours: Temp:  [97.7 F (36.5 C)-98.8 F (37.1 C)] 98.2 F (36.8 C) (08/08 0926) Pulse Rate:  [49-59] 58 (08/08 0926) Resp:  [12-27] 18 (08/08 0926) BP: (111-205)/(57-135) 123/57 (08/08 0926) SpO2:  [93 %-100 %] 98 % (08/08 0926) Weight:  [95 kg] 95 kg (08/07 1510) Last BM Date : 02/28/22  Intake/Output from previous day: 08/07 0701 - 08/08 0700 In: 1223.8 [P.O.:60; I.V.:1163.8] Out: 50 [Emesis/NG output:50] Intake/Output this shift: No intake/output data recorded.  PE: Heart: regular Lungs: CTAB Abd: soft, tender periumbilical and somewhat diffusely.  No one spot focally.  +BS, ND, incisions are all healing well.  Lab Results:  Recent Labs    02/28/22 1544 03/01/22 0412  WBC 10.0 12.5*  HGB 13.2 12.1*  HCT 38.7* 36.4*  PLT 316 272   BMET Recent Labs    02/28/22 1544 03/01/22 0412  NA 137 141  K 3.7 3.2*  CL 104 110  CO2 24 22  GLUCOSE 106* 92  BUN 12 13  CREATININE 0.77 0.73  CALCIUM 8.9 8.5*   PT/INR No results for input(s): "LABPROT", "INR" in the last 72 hours. CMP     Component Value Date/Time   NA 141 03/01/2022 0412   NA 137 10/07/2021 1320   K 3.2 (L) 03/01/2022 0412   CL 110 03/01/2022 0412   CL 103 05/11/2017 0000   CO2 22 03/01/2022 0412   CO2 23 05/11/2017 0000   GLUCOSE 92 03/01/2022 0412   BUN 13 03/01/2022 0412   BUN 18 10/07/2021 1320   CREATININE 0.73 03/01/2022 0412   CALCIUM 8.5 (L) 03/01/2022 0412   PROT 6.4 (L) 03/01/2022 0412   PROT 7.1 10/07/2021 1320   ALBUMIN 3.2 (L) 03/01/2022 0412   ALBUMIN 4.3 10/07/2021 1320   AST 19 03/01/2022 0412   ALT 29 03/01/2022 0412   ALKPHOS 89 03/01/2022 0412   BILITOT 0.7 03/01/2022 0412   BILITOT 0.3 10/07/2021 1320   GFRNONAA >60 03/01/2022 0412   GFRAA 90 09/08/2020 1409   Lipase     Component Value Date/Time    LIPASE 40 02/28/2022 1544       Studies/Results: CT ABDOMEN PELVIS W CONTRAST  Result Date: 02/28/2022 CLINICAL DATA:  Abdominal pain, post-op. Pt complains of generalized abdominal pain and lumbar back pain. Pain started 2 days ago, has been constant and getting progressively worse. Patient endorses vomiting while in triage area. Patient also reports his dysuria today and chills. Cholecystectomy 02/17/2022 EXAM: CT ABDOMEN AND PELVIS WITH CONTRAST TECHNIQUE: Multidetector CT imaging of the abdomen and pelvis was performed using the standard protocol following bolus administration of intravenous contrast. RADIATION DOSE REDUCTION: This exam was performed according to the departmental dose-optimization program which includes automated exposure control, adjustment of the mA and/or kV according to patient size and/or use of iterative reconstruction technique. CONTRAST:  19m OMNIPAQUE IOHEXOL 300 MG/ML  SOLN COMPARISON:  CT angiography chest, abdomen, pelvis 12/16/2021. Ultrasound abdomen 02/14/2022, ultrasound abdomen 02/17/2022 FINDINGS: Lower chest: Couple nodular-like ground-glass airspace opacities of the basilar right lower lobe measuring 1.5 x 0.9 and 1.3 x 0.9 cm. Hepatobiliary: Redemonstration of subcentimeter hypodensity too small to characterize. Status post cholecystectomy. Vague density within the gallbladder fossa measuring approximately 2.9 by 2.5 cm. No biliary dilatation. Pancreas: No focal lesion. Normal pancreatic contour. No surrounding inflammatory changes.  No main pancreatic ductal dilatation. Spleen: Normal in size without focal abnormality. Adrenals/Urinary Tract: No adrenal nodule bilaterally. Bilateral kidneys enhance symmetrically. No hydronephrosis. No hydroureter. The urinary bladder is unremarkable. Stomach/Bowel: Stomach is within normal limits. No evidence of bowel wall thickening or dilatation. Likely second portion of the duodenum diverticula (5:59). Few scattered colonic  diverticula. The appendix is not definitely identified with no inflammatory changes in the right lower quadrant to suggest acute appendicitis. Vascular/Lymphatic: No abdominal aorta or iliac aneurysm. Mild atherosclerotic plaque of the aorta and its branches. No abdominal, pelvic, or inguinal lymphadenopathy. Reproductive: Prostate is unremarkable. Other: No intraperitoneal free fluid. No intraperitoneal free gas. No organized fluid collection. Musculoskeletal: No abdominal wall hernia or abnormality. No suspicious lytic or blastic osseous lesions. No acute displaced fracture. L4-S1 posterolateral and interbody fusion surgical hardware associated laminectomies. IMPRESSION: 1. Vague density within the gallbladder fossa measuring approximately 2.9 x 2.5 cm in a patient status post cholecystectomy. Finding could represent developing abscess versus tiny biloma. 2. Couple nodular-like ground-glass airspace opacities of the basilar right lower lobe measuring 1.5 and 1.3 cm. Finding could represent infection versus inflammation. Given nodular-like morphology, consider CT follow-up in 6 months. 3. Colonic diverticulosis with no acute diverticulitis. 4.  Aortic Atherosclerosis (ICD10-I70.0). Electronically Signed   By: Iven Finn M.D.   On: 02/28/2022 17:04    Anti-infectives: Anti-infectives (From admission, onward)    None        Assessment/Plan POD 12, s/p lap chole by Dr. Kieth Brightly with post op abdominal pain and 3cm gb fossa fluid collection -I agree that patient's pain seems more diffuse than expected for focal 3cm fluid collection in gb fossa.  -WBC is slightly elevated today at 12.5, but LFTs normal -hold on abx therapy -HIDA scan pending to rule out bile leak -replace K today, check BMET in am  FEN - NPO for HIDA/IVFs VTE - Lovenox ID - none currently    LOS: 1 day    Henreitta Cea , [facility] Surgery 03/01/2022, 11:19 AM Please see Amion for pager number during day  hours 7:00am-4:30pm or 7:00am -11:30am on weekends

## 2022-03-02 ENCOUNTER — Ambulatory Visit (HOSPITAL_COMMUNITY)
Admission: RE | Admit: 2022-03-02 | Discharge: 2022-03-02 | Disposition: A | Discharge status: Home / Self Care | Payer: Medicaid Other | Source: Ambulatory Visit | Attending: Orthopedic Surgery | Admitting: Orthopedic Surgery

## 2022-03-02 DIAGNOSIS — M5416 Radiculopathy, lumbar region: Secondary | ICD-10-CM | POA: Insufficient documentation

## 2022-03-02 DIAGNOSIS — M47816 Spondylosis without myelopathy or radiculopathy, lumbar region: Secondary | ICD-10-CM | POA: Insufficient documentation

## 2022-03-02 DIAGNOSIS — M5030 Other cervical disc degeneration, unspecified cervical region: Secondary | ICD-10-CM | POA: Insufficient documentation

## 2022-03-02 DIAGNOSIS — Z981 Arthrodesis status: Secondary | ICD-10-CM | POA: Insufficient documentation

## 2022-03-02 LAB — CBC
HCT: 34 % — ABNORMAL LOW (ref 39.0–52.0)
Hemoglobin: 11.4 g/dL — ABNORMAL LOW (ref 13.0–17.0)
MCH: 31.3 pg (ref 26.0–34.0)
MCHC: 33.5 g/dL (ref 30.0–36.0)
MCV: 93.4 fL (ref 80.0–100.0)
Platelets: 225 10*3/uL (ref 150–400)
RBC: 3.64 MIL/uL — ABNORMAL LOW (ref 4.22–5.81)
RDW: 13.1 % (ref 11.5–15.5)
WBC: 10.1 10*3/uL (ref 4.0–10.5)
nRBC: 0 % (ref 0.0–0.2)

## 2022-03-02 LAB — BASIC METABOLIC PANEL
Anion gap: 6 (ref 5–15)
BUN: 13 mg/dL (ref 8–23)
CO2: 21 mmol/L — ABNORMAL LOW (ref 22–32)
Calcium: 8.4 mg/dL — ABNORMAL LOW (ref 8.9–10.3)
Chloride: 113 mmol/L — ABNORMAL HIGH (ref 98–111)
Creatinine, Ser: 0.82 mg/dL (ref 0.61–1.24)
GFR, Estimated: >60 mL/min (ref >60)
Glucose, Bld: 145 mg/dL — ABNORMAL HIGH (ref 70–99)
Potassium: 3.7 mmol/L (ref 3.5–5.1)
Sodium: 140 mmol/L (ref 135–145)

## 2022-03-02 MED ORDER — POLYETHYLENE GLYCOL 3350 17 G PO PACK
17.0000 g | PACK | Freq: Every day | ORAL | Status: DC
Start: 2022-03-02 — End: 2022-03-02
  Administered 2022-03-02: 17 g via ORAL
  Filled 2022-03-02: qty 1

## 2022-03-02 MED ORDER — FLEET ENEMA 7-19 GM/118ML RE ENEM: 1.0000 | ENEMA | Freq: Every day | RECTAL | Status: DC | PRN

## 2022-03-02 MED ORDER — OXYCODONE HCL 5 MG PO TABS: 5.0000 mg | ORAL_TABLET | ORAL | 0 refills | Status: DC | PRN

## 2022-03-02 MED ORDER — BISACODYL 10 MG RE SUPP
10.0000 mg | Freq: Once | RECTAL | Status: AC
  Administered 2022-03-02: 10 mg via RECTAL
  Filled 2022-03-02: qty 1

## 2022-03-02 NOTE — Discharge Summary (Signed)
Patient ID: Keith Gibbs [id number] April 03, 1961 61 y.o.  Admit date: 02/28/2022 Discharge date: 03/02/2022  Admitting Diagnosis: Abdominal pain, s/p lap chole  Discharge Diagnosis Patient Active Problem List   Diagnosis Date Noted   Postoperative abdominal pain 02/28/2022   Acute cholecystitis 02/17/2022   Pulmonary nodule 12/17/2021   CAD S/P percutaneous coronary angioplasty 12/17/2021   Epigastric abdominal pain    Nausea and vomiting    Tobacco dependence due to cigarettes 08/31/2021   Iron deficiency anemia secondary to inadequate dietary iron intake 08/31/2021   Acute lead-induced gout involving toe of left foot 08/31/2021   Encounter for lipid screening for cardiovascular disease 08/31/2021   Hypotension due to drugs 06/21/2021   Opioid withdrawal ([place]) 06/21/2021   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[place]) 06/21/2021   RLS (restless legs syndrome) 05/07/2021   Acquired deviated nasal septum 05/03/2021   Encounter for surveillance of abnormal nevi 05/03/2021   Flu vaccine need 05/03/2021   Substance abuse ([place]) 05/03/2021   Drug abuse and dependence ([place]) 05/03/2021   Acute midline low back pain without sciatica 05/03/2021   Need for shingles vaccine 05/03/2021   Right groin pain 01/29/2021   S/P TKR (total knee replacement) using cement, right 01/19/2021   Cigarette smoker 07/21/2020   Foot pain, bilateral 12/25/2019   Potential exposure to STD 12/12/2019   History of lumbar surgery 12/12/2019   Fatigue 12/12/2019   Penile lesion 12/12/2019   Right testicular pain 12/12/2019   Body mass index 28.0-28.9, adult 12/12/2019   History of COPD 12/12/2019   Tobacco use disorder, continuous 12/12/2019   Hepatoma ([place]) 11/05/2019   Anxiety 12/10/2018   Unstable angina ([place]) 11/30/2018   Essential hypertension 09/18/2018   Heart valve disorder 09/18/2018   Hypercholesteremia 09/18/2018   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   Chronic anticoagulation (Plavix) 04/19/2018   Pain  medication agreement broken ([place]) 04/19/2018   Cocaine use 04/18/2018   Cocaine abuse ([place]) (See 04/12/18 UDS) 04/18/2018   Marijuana abuse (See 04/12/18 UDS) 04/18/2018   Long term current use of opiate analgesic 04/12/2018   Cirrhosis of liver ([place]) 04/03/2018   BPH (benign prostatic hyperplasia) 04/03/2018   Hematuria, microscopic 04/03/2018   Osteoarthritis of the knee (Left) 11/02/2017   Lumbar facet syndrome (Bilateral) 11/01/2017   Spondylosis without myelopathy or radiculopathy, lumbosacral region 11/01/2017   Osteoarthritis of shoulder (Right) 11/01/2017   Osteoarthritis of acromioclavicular joint (Right) 11/01/2017   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) 11/01/2017   Osteoarthritis 11/01/2017   Cervicalgia 11/01/2017   Cervical facet syndrome 11/01/2017   DDD (degenerative disc disease), cervical 11/01/2017   DDD (degenerative disc disease), lumbar 11/01/2017   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) 09/18/2017   Other chronic pain 09/18/2017   Chronic musculoskeletal pain 09/18/2017   Chronic pain of right knee 09/11/2017   Chronic low back pain (Secondary Area of Pain) (Bilateral) 09/11/2017   Chronic hip pain (Tertiary Area of Pain) (Bilateral) (R>L) 09/11/2017   Chronic shoulder pain (Fourth Area of Pain) (Right) 09/11/2017   Spondylosis without myelopathy or radiculopathy, cervical region 09/11/2017   Chronic pain syndrome 09/11/2017   Opiate use 09/11/2017   Screen for STD (sexually transmitted disease) 09/11/2017   Disorder of skeletal system 09/11/2017   Problems influencing health status 09/11/2017   Overdose of medication, intentional self-harm, sequela ([place]) 12/28/2016   Severe recurrent major depression without psychotic features ([place]) 12/27/2016   Coronary artery disease of native artery of native heart with stable angina pectoris ([place]) 12/27/2016  Alcohol use disorder, moderate, dependence ([place]) 12/27/2016   Cannabis use disorder, moderate,  dependence ([place]) 12/27/2016   Angina at rest [facility]) 06/11/2016   Chest pain 06/10/2016   Bipolar 1 disorder ([place]) 05/27/2013   Abnormal ejaculation 11/30/2012   Chest pain, unspecified 11/30/2012   Degenerative arthritis of hip 11/16/2012   Schizoaffective disorder ([place]) 06/27/2012   COPD (chronic obstructive pulmonary disease) ([place]) 06/27/2012   Chronic hepatitis C ([place]) 06/27/2012   GERD (gastroesophageal reflux disease) 06/27/2012   Complete rupture of rotator cuff 02/10/2012   Cocaine abuse in remission ([place]) 12/05/2011   Unspecified osteoarthritis, unspecified site 07/27/2011    Consultants none  Reason for Admission: This is a 61 year old gentleman who is postoperative day #11 status post laparoscopic cholecystectomy for symptomatic gallstones by Dr. Cherlyn Roberts.  He reports he was doing well until yesterday when he started having epigastric abdominal pain.  It severely worsened through the night and day.  He had nausea and vomiting.  He now describes a sharp constant pain in the epigastric area.  He underwent a CT scan of his abdomen and pelvis which showed a small fluid collection in the gallbladder wall fossa.  His white blood count and liver function tests are normal.  He denies chest pain or shortness of breath.  He has low back pain and is being worked up by orthopedics for this.  Procedures none  Hospital Course:  The patient was admitted for observation to rule out post operative complication.  He had no leukocytosis, fever, etc.  His CT revealed a small 3cm fluid collection in the gb fossa.  His LFTs were normal.  He underwent a HIDA scan which was negative for a bile leak.  His symptoms were more lower abdominal pain and pain when he would move his bowels.  His pain is not consistently with a post op complication from a lap chole.  He was tolerating a regular diet and having bowel function at the time of discharge on HD 2.  He is also having significant low back issues and is  scheduled for an MRI later today as an outpatient.  It is unclear if this may be contributing to some radiating pain in his abdomen as well.  The patient was otherwise surgically stable for DC home at this time with appropriate follow up being made.  Physical Exam: Abd: soft, +BS, ND, healing ecchymosis noted around his incisions, minimally tender across lower abdomen.  Not really tender in his RUQ.  Allergies as of 03/02/2022       Reactions   Latuda [lurasidone Hcl] Other (See Comments)   Tremors   Saphris [asenapine] Other (See Comments)   Increased tremors        Medication List     TAKE these medications    acetaminophen 500 MG tablet Commonly known as: TYLENOL Take 2 tablets (1,000 mg total) by mouth every 6 (six) hours. What changed:  when to take this reasons to take this   albuterol 108 (90 Base) MCG/ACT inhaler Commonly known as: VENTOLIN HFA Inhale 2 puffs into the lungs every 6 (six) hours as needed for wheezing or shortness of breath.   aspirin EC 81 MG tablet Take 1 tablet (81 mg total) by mouth daily. Swallow whole.   atorvastatin 80 MG tablet Commonly known as: LIPITOR Take 80 mg by mouth at bedtime.   azelastine 0.1 % nasal spray Commonly known as: ASTELIN Place 1 spray into both nostrils at bedtime.   Breztri Aerosphere 160-9-4.8 MCG/ACT  Aero Generic drug: Budeson-Glycopyrrol-Formoterol Take 2 puffs first thing in am and then another 2 puffs about 12 hours later.   busPIRone 15 MG tablet Commonly known as: BUSPAR Take 1 tablet (15 mg total) by mouth 2 (two) times daily.   cetirizine 10 MG tablet Commonly known as: ZYRTEC TAKE 1 TABLET BY MOUTH EVERY DAY   cyclobenzaprine 10 MG tablet Commonly known as: FLEXERIL Take 1 tablet (10 mg total) by mouth daily as needed for muscle spasms.   diclofenac 75 MG EC tablet Commonly known as: VOLTAREN Take 75 mg by mouth in the morning and at bedtime.   divalproex 250 MG DR tablet Commonly known as:  Depakote Take 1 tablet (250 mg total) by mouth 2 (two) times daily.   Fish Oil 1200 MG Caps Take 1,200 mg by mouth daily.   fluticasone 50 MCG/ACT nasal spray Commonly known as: FLONASE Place 1 spray into both nostrils at bedtime.   gabapentin 300 MG capsule Commonly known as: NEURONTIN Take 300 mg by mouth 3 (three) times daily.   multivitamin tablet Take 1 tablet by mouth daily.   nitroGLYCERIN 0.3 MG SL tablet Commonly known as: NITROSTAT Place 0.3 mg under the tongue every 5 (five) minutes as needed for chest pain.   omeprazole 40 MG capsule Commonly known as: PRILOSEC TAKE 1 CAPSULE (40 MG TOTAL) BY MOUTH DAILY.   oxyCODONE 5 MG immediate release tablet Commonly known as: Oxy IR/ROXICODONE Take 1-2 tablets (5-10 mg total) by mouth every 4 (four) hours as needed for moderate pain. What changed:  medication strength when to take this reasons to take this   polyethylene glycol 17 g packet Commonly known as: MIRALAX / GLYCOLAX Take 17 g by mouth daily as needed for mild constipation.   QUEtiapine 200 MG tablet Commonly known as: SEROQUEL Take 1 tablet (200 mg total) by mouth at bedtime.   rOPINIRole 2 MG tablet Commonly known as: REQUIP TAKE 1 TABLET BY MOUTH EVERYDAY AT BEDTIME What changed: See the new instructions.   sertraline 25 MG tablet Commonly known as: Zoloft Take 1 tablet (25 mg total) by mouth daily.   tamsulosin 0.4 MG Caps capsule Commonly known as: FLOMAX TAKE 1 CAPSULE BY MOUTH EVERY DAY          Follow-up Information     Maczis, Carlena Hurl, PA-C Follow up on 03/10/2022.   Specialty: General Surgery Why: 3:00pm, Arrive 30 minutes prior to your appointment time, Please bring your insurance card and photo ID Contact information: [place] [address] [phone]         Gildardo Pounds, NP Follow up today.   Specialty: Nurse Practitioner Why: As needed Contact  information: [place] [address] [phone]                 Signed: Saverio Danker, [facility] Surgery 03/02/2022, 12:00 PM Please see Amion for pager number during day hours 7:00am-4:30pm, 7-11:30am on Weekends

## 2022-03-02 NOTE — Telephone Encounter (Signed)
Spoke to patient states is in the hospital. States has already had surgery. Will cal back to schedule rov with Dr. Melvyn Novas when discharged from the hospital. [facility]

## 2022-03-02 NOTE — Telephone Encounter (Signed)
Dr Melvyn Novas,  Patient states he is in the hospital and has already had the surgery. So I will close this encounter since the surgical clearance is not longer required for this patient.   Thank you  Teal Bontrager RN BSN

## 2022-03-02 NOTE — Progress Notes (Signed)
Discharge instructions discussed with patient and family, verbalized agreement and understanding 

## 2022-03-03 ENCOUNTER — Telehealth: Payer: Self-pay

## 2022-03-03 LAB — URINE CULTURE: Culture: NO GROWTH

## 2022-03-03 NOTE — Telephone Encounter (Signed)
Transition Care Management Follow-up Telephone Call Date of discharge and from where: 03/02/2022, [facility] How have you been since you were released from the hospital? He said he is okay Any questions or concerns? Yes - needs referral to GI  Items Reviewed: Did the pt receive and understand the discharge instructions provided? Yes  Medications obtained and verified? Yes - he said he has all of his medications and he did not have any questions about the med regime  Other? No  Any new allergies since your discharge? No  Dietary orders reviewed? Yes - he said he is scared to eat for fear of developing GI complications.   Do you have support at home? Yes   Home Care and Equipment/Supplies: Were home health services ordered? no If so, what is the name of the agency? N/a  Has the agency set up a time to come to the patient's home? not applicable Were any new equipment or medical supplies ordered?  No What is the name of the medical supply agency? N/a Were you able to get the supplies/equipment? not applicable Do you have any questions related to the use of the equipment or supplies? No  Functional Questionnaire: (I = Independent and D = Dependent) ADLs: independent. He said he has a cane at home to use if needed    Follow up appointments reviewed:  PCP Hospital f/u appt confirmed? Yes  Scheduled to see Geryl Rankins, NP - 03/16/2022.  [facility] f/u appt confirmed? Yes  Scheduled to see surgeon - 03/10/2022.   Are transportation arrangements needed? No  If their condition worsens, is the pt aware to call PCP or go to the Emergency Dept.? Yes Was the patient provided with contact information for the PCP's office or ED? Yes Was to pt encouraged to call back with questions or concerns? Yes

## 2022-03-03 NOTE — Telephone Encounter (Signed)
Opal Sidles do you know why he needs referral to GI? There are no indications in AVS for GI referral and colonoscopy is not due

## 2022-03-07 ENCOUNTER — Other Ambulatory Visit: Payer: Self-pay | Admitting: Nurse Practitioner

## 2022-03-07 DIAGNOSIS — Z1211 Encounter for screening for malignant neoplasm of colon: Secondary | ICD-10-CM

## 2022-03-07 NOTE — Telephone Encounter (Signed)
Call returned to patient # [phone], message left with call back requested.

## 2022-03-07 NOTE — Telephone Encounter (Signed)
I called the patient to clarify his request. He said he is having a lot of abdominal pain, sometimes unbearable.  He also reports occasional nausea. No vomiting since returning home from the hospital. He said he is afraid to eat for fear of abdominal pain recurring. He stated he is only eating 1 meal day and his weight is now 189 lbs down from 235 lbs - he could not tell me the time frame for this weight loss. He also noted that it is painful when he moves his bowels.  He has tried many remedies for constipation with no success.   He has an appointment with Ms Raul Del, NP - 03/16/2022. His appointment with orthopedic surgery has been changed is now now 03/09/2022.  He said he will be receiving an injection for pain.

## 2022-03-07 NOTE — Telephone Encounter (Signed)
Pt called asking if Opal Sidles could call him back.  He said he talked to her but needs to talk to her again.  CB@  [phone]

## 2022-03-07 NOTE — Telephone Encounter (Signed)
Pt is requesting a call back. 

## 2022-03-16 ENCOUNTER — Ambulatory Visit: Payer: Medicaid Other | Admitting: Nurse Practitioner

## 2022-03-16 NOTE — Telephone Encounter (Signed)
Patient was no show for appointment today with Geryl Rankins, NP

## 2022-04-13 ENCOUNTER — Ambulatory Visit: Payer: Medicaid Other | Admitting: Dermatology

## 2022-04-26 ENCOUNTER — Other Ambulatory Visit (HOSPITAL_COMMUNITY)
Admission: EM | Admit: 2022-04-26 | Discharge: 2022-05-02 | Disposition: A | Discharge status: Rehabilitation Facility | Payer: Medicaid Other | Attending: Psychiatry | Admitting: Psychiatry

## 2022-04-26 DIAGNOSIS — F4310 Post-traumatic stress disorder, unspecified: Secondary | ICD-10-CM | POA: Insufficient documentation

## 2022-04-26 DIAGNOSIS — F17210 Nicotine dependence, cigarettes, uncomplicated: Secondary | ICD-10-CM | POA: Insufficient documentation

## 2022-04-26 DIAGNOSIS — F10230 Alcohol dependence with withdrawal, uncomplicated: Secondary | ICD-10-CM | POA: Insufficient documentation

## 2022-04-26 DIAGNOSIS — F1910 Other psychoactive substance abuse, uncomplicated: Secondary | ICD-10-CM | POA: Diagnosis not present

## 2022-04-26 DIAGNOSIS — J431 Panlobular emphysema: Secondary | ICD-10-CM

## 2022-04-26 DIAGNOSIS — Z79899 Other long term (current) drug therapy: Secondary | ICD-10-CM | POA: Insufficient documentation

## 2022-04-26 DIAGNOSIS — G2581 Restless legs syndrome: Secondary | ICD-10-CM

## 2022-04-26 DIAGNOSIS — Z818 Family history of other mental and behavioral disorders: Secondary | ICD-10-CM | POA: Insufficient documentation

## 2022-04-26 DIAGNOSIS — F250 Schizoaffective disorder, bipolar type: Secondary | ICD-10-CM | POA: Insufficient documentation

## 2022-04-26 DIAGNOSIS — Z9151 Personal history of suicidal behavior: Secondary | ICD-10-CM | POA: Insufficient documentation

## 2022-04-26 DIAGNOSIS — F1410 Cocaine abuse, uncomplicated: Secondary | ICD-10-CM | POA: Insufficient documentation

## 2022-04-26 DIAGNOSIS — F1123 Opioid dependence with withdrawal: Secondary | ICD-10-CM | POA: Insufficient documentation

## 2022-04-26 DIAGNOSIS — Z1152 Encounter for screening for COVID-19: Secondary | ICD-10-CM | POA: Insufficient documentation

## 2022-04-26 LAB — COMPREHENSIVE METABOLIC PANEL
ALT: 30 U/L (ref 0–44)
AST: 18 U/L (ref 15–41)
Albumin: 3.4 g/dL — ABNORMAL LOW (ref 3.5–5.0)
Alkaline Phosphatase: 83 U/L (ref 38–126)
Anion gap: 9 (ref 5–15)
BUN: 11 mg/dL (ref 8–23)
CO2: 26 mmol/L (ref 22–32)
Calcium: 9 mg/dL (ref 8.9–10.3)
Chloride: 105 mmol/L (ref 98–111)
Creatinine, Ser: 0.66 mg/dL (ref 0.61–1.24)
GFR, Estimated: >60 mL/min (ref >60)
Glucose, Bld: 92 mg/dL (ref 70–99)
Potassium: 3.6 mmol/L (ref 3.5–5.1)
Sodium: 140 mmol/L (ref 135–145)
Total Bilirubin: <0.1 mg/dL — ABNORMAL LOW (ref 0.3–1.2)
Total Protein: 6.3 g/dL — ABNORMAL LOW (ref 6.5–8.1)

## 2022-04-26 LAB — TSH: TSH: 0.59 u[IU]/mL (ref 0.350–4.500)

## 2022-04-26 LAB — CBC WITH DIFFERENTIAL/PLATELET
Abs Immature Granulocytes: 0.01 10*3/uL (ref 0.00–0.07)
Basophils Absolute: 0 10*3/uL (ref 0.0–0.1)
Basophils Relative: 0 %
Eosinophils Absolute: 0.6 10*3/uL — ABNORMAL HIGH (ref 0.0–0.5)
Eosinophils Relative: 9 %
HCT: 37.6 % — ABNORMAL LOW (ref 39.0–52.0)
Hemoglobin: 12.8 g/dL — ABNORMAL LOW (ref 13.0–17.0)
Immature Granulocytes: 0 %
Lymphocytes Relative: 34 %
Lymphs Abs: 2.4 10*3/uL (ref 0.7–4.0)
MCH: 31.8 pg (ref 26.0–34.0)
MCHC: 34 g/dL (ref 30.0–36.0)
MCV: 93.5 fL (ref 80.0–100.0)
Monocytes Absolute: 0.5 10*3/uL (ref 0.1–1.0)
Monocytes Relative: 6 %
Neutro Abs: 3.7 10*3/uL (ref 1.7–7.7)
Neutrophils Relative %: 51 %
Platelets: 242 10*3/uL (ref 150–400)
RBC: 4.02 MIL/uL — ABNORMAL LOW (ref 4.22–5.81)
RDW: 13.6 % (ref 11.5–15.5)
WBC: 7.3 10*3/uL (ref 4.0–10.5)
nRBC: 0 % (ref 0.0–0.2)

## 2022-04-26 LAB — POCT URINE DRUG SCREEN - MANUAL ENTRY (I-SCREEN)
POC Amphetamine UR: NOT DETECTED
POC Buprenorphine (BUP): NOT DETECTED
POC Cocaine UR: NOT DETECTED
POC Marijuana UR: POSITIVE — AB
POC Methadone UR: NOT DETECTED
POC Methamphetamine UR: NOT DETECTED
POC Morphine: NOT DETECTED
POC Oxazepam (BZO): NOT DETECTED
POC Oxycodone UR: NOT DETECTED
POC Secobarbital (BAR): NOT DETECTED

## 2022-04-26 LAB — POC SARS CORONAVIRUS 2 AG: SARSCOV2ONAVIRUS 2 AG: NEGATIVE

## 2022-04-26 LAB — HEMOGLOBIN A1C
Hgb A1c MFr Bld: 5.2 % (ref 4.8–5.6)
Mean Plasma Glucose: 102.54 mg/dL

## 2022-04-26 LAB — LIPID PANEL
Cholesterol: 83 mg/dL (ref 0–200)
HDL: 28 mg/dL — ABNORMAL LOW (ref >40)
LDL Cholesterol: 45 mg/dL (ref 0–99)
Total CHOL/HDL Ratio: 3 RATIO
Triglycerides: 52 mg/dL (ref <150)
VLDL: 10 mg/dL (ref 0–40)

## 2022-04-26 LAB — FERRITIN: Ferritin: 80 ng/mL (ref 24–336)

## 2022-04-26 LAB — VALPROIC ACID LEVEL: Valproic Acid Lvl: 22 ug/mL — ABNORMAL LOW (ref 50.0–100.0)

## 2022-04-26 LAB — MAGNESIUM: Magnesium: 1.9 mg/dL (ref 1.7–2.4)

## 2022-04-26 LAB — SARS CORONAVIRUS 2 BY RT PCR: SARS Coronavirus 2 by RT PCR: NEGATIVE

## 2022-04-26 MED ORDER — MELOXICAM 7.5 MG PO TABS
7.5000 mg | ORAL_TABLET | Freq: Every day | ORAL | Status: DC
  Administered 2022-04-26 – 2022-05-02 (×7): 7.5 mg via ORAL
  Filled 2022-04-26 (×7): qty 1

## 2022-04-26 MED ORDER — TRAZODONE HCL 50 MG PO TABS
50.0000 mg | ORAL_TABLET | Freq: Every evening | ORAL | Status: DC | PRN
  Administered 2022-04-27 – 2022-05-01 (×4): 50 mg via ORAL
  Filled 2022-04-26 (×2): qty 1
  Filled 2022-04-26: qty 30
  Filled 2022-04-26 (×2): qty 1

## 2022-04-26 MED ORDER — DULOXETINE HCL 60 MG PO CPEP: 60.0000 mg | ORAL_CAPSULE | Freq: Every day | ORAL | Status: DC

## 2022-04-26 MED ORDER — MELOXICAM 7.5 MG PO TABS: 15.0000 mg | ORAL_TABLET | Freq: Every day | ORAL | Status: DC

## 2022-04-26 MED ORDER — NICOTINE POLACRILEX 2 MG MT GUM: 4.0000 mg | CHEWING_GUM | OROMUCOSAL | Status: DC | PRN

## 2022-04-26 MED ORDER — ACETAMINOPHEN 325 MG PO TABS
650.0000 mg | ORAL_TABLET | Freq: Four times a day (QID) | ORAL | Status: DC | PRN
  Administered 2022-04-27 – 2022-05-01 (×5): 650 mg via ORAL
  Filled 2022-04-26 (×6): qty 2

## 2022-04-26 MED ORDER — LORAZEPAM 1 MG PO TABS: 1.0000 mg | ORAL_TABLET | Freq: Four times a day (QID) | ORAL | Status: AC | PRN

## 2022-04-26 MED ORDER — OMEGA-3-ACID ETHYL ESTERS 1 G PO CAPS
1.0000 g | ORAL_CAPSULE | Freq: Every day | ORAL | Status: DC
  Administered 2022-04-26 – 2022-05-02 (×7): 1 g via ORAL
  Filled 2022-04-26 (×7): qty 1

## 2022-04-26 MED ORDER — ONDANSETRON 4 MG PO TBDP: 4.0000 mg | ORAL_TABLET | Freq: Four times a day (QID) | ORAL | Status: DC | PRN

## 2022-04-26 MED ORDER — ROPINIROLE HCL 1 MG PO TABS
2.0000 mg | ORAL_TABLET | Freq: Every day | ORAL | Status: DC
  Administered 2022-04-26 – 2022-05-01 (×6): 2 mg via ORAL
  Filled 2022-04-26 (×6): qty 2

## 2022-04-26 MED ORDER — ASPIRIN 81 MG PO TBEC
81.0000 mg | DELAYED_RELEASE_TABLET | Freq: Every day | ORAL | Status: DC
  Administered 2022-04-26 – 2022-05-02 (×7): 81 mg via ORAL
  Filled 2022-04-26 (×7): qty 1

## 2022-04-26 MED ORDER — FLUTICASONE PROPIONATE 50 MCG/ACT NA SUSP
1.0000 | Freq: Every day | NASAL | Status: DC
  Administered 2022-04-26 – 2022-05-01 (×6): 1 via NASAL
  Filled 2022-04-26 (×3): qty 16

## 2022-04-26 MED ORDER — ENALAPRIL MALEATE 2.5 MG PO TABS
2.5000 mg | ORAL_TABLET | Freq: Two times a day (BID) | ORAL | Status: DC
  Administered 2022-04-26 – 2022-05-02 (×10): 2.5 mg via ORAL
  Filled 2022-04-26 (×12): qty 1

## 2022-04-26 MED ORDER — ALUM & MAG HYDROXIDE-SIMETH 200-200-20 MG/5ML PO SUSP
30.0000 mL | ORAL | Status: DC | PRN
  Administered 2022-04-30 (×2): 30 mL via ORAL
  Filled 2022-04-26 (×2): qty 30

## 2022-04-26 MED ORDER — NICOTINE 21 MG/24HR TD PT24
21.0000 mg | MEDICATED_PATCH | Freq: Every day | TRANSDERMAL | Status: DC
  Administered 2022-04-26 – 2022-05-01 (×6): 21 mg via TRANSDERMAL
  Filled 2022-04-26 (×7): qty 1

## 2022-04-26 MED ORDER — BUDESON-GLYCOPYRROL-FORMOTEROL 160-9-4.8 MCG/ACT IN AERO
2.0000 | INHALATION_SPRAY | Freq: Two times a day (BID) | RESPIRATORY_TRACT | Status: DC
  Administered 2022-04-26 – 2022-05-02 (×12): 2 via OROMUCOSAL
  Filled 2022-04-26 (×2): qty 0.42

## 2022-04-26 MED ORDER — MELOXICAM 7.5 MG PO TABS: 7.5000 mg | ORAL_TABLET | Freq: Once | ORAL | Status: DC

## 2022-04-26 MED ORDER — DULOXETINE HCL 60 MG PO CPEP
60.0000 mg | ORAL_CAPSULE | Freq: Every day | ORAL | Status: DC
  Administered 2022-04-26 – 2022-05-02 (×7): 60 mg via ORAL
  Filled 2022-04-26 (×7): qty 1

## 2022-04-26 MED ORDER — BUDESON-GLYCOPYRROL-FORMOTEROL 160-9-4.8 MCG/ACT IN AERO: 2.0000 | INHALATION_SPRAY | Freq: Two times a day (BID) | RESPIRATORY_TRACT | Status: DC

## 2022-04-26 MED ORDER — HYDROXYZINE HCL 25 MG PO TABS
25.0000 mg | ORAL_TABLET | Freq: Four times a day (QID) | ORAL | Status: AC | PRN
  Filled 2022-04-26: qty 1

## 2022-04-26 MED ORDER — ATORVASTATIN CALCIUM 40 MG PO TABS
80.0000 mg | ORAL_TABLET | Freq: Every day | ORAL | Status: DC
  Administered 2022-04-26 – 2022-05-01 (×6): 80 mg via ORAL
  Filled 2022-04-26 (×6): qty 2

## 2022-04-26 MED ORDER — HYDROXYZINE HCL 25 MG PO TABS: 50.0000 mg | ORAL_TABLET | Freq: Three times a day (TID) | ORAL | Status: DC | PRN

## 2022-04-26 MED ORDER — BUPRENORPHINE HCL-NALOXONE HCL 8-2 MG SL SUBL
1.0000 | SUBLINGUAL_TABLET | Freq: Two times a day (BID) | SUBLINGUAL | Status: DC
  Administered 2022-04-26 – 2022-04-29 (×6): 1 via SUBLINGUAL
  Filled 2022-04-26 (×6): qty 1

## 2022-04-26 MED ORDER — NICOTINE 21 MG/24HR TD PT24: 21.0000 mg | MEDICATED_PATCH | Freq: Every day | TRANSDERMAL | Status: DC

## 2022-04-26 MED ORDER — DICLOFENAC SODIUM 1 % EX GEL
2.0000 g | Freq: Four times a day (QID) | CUTANEOUS | Status: DC
  Administered 2022-04-26 – 2022-05-02 (×14): 2 g via TOPICAL
  Filled 2022-04-26 (×5): qty 100

## 2022-04-26 MED ORDER — LORATADINE 10 MG PO TABS
10.0000 mg | ORAL_TABLET | Freq: Every day | ORAL | Status: DC
  Administered 2022-04-26 – 2022-05-02 (×7): 10 mg via ORAL
  Filled 2022-04-26 (×7): qty 1

## 2022-04-26 MED ORDER — DIVALPROEX SODIUM 250 MG PO DR TAB
250.0000 mg | DELAYED_RELEASE_TABLET | Freq: Two times a day (BID) | ORAL | Status: DC
  Administered 2022-04-26 – 2022-05-02 (×12): 250 mg via ORAL
  Filled 2022-04-26 (×12): qty 1

## 2022-04-26 MED ORDER — CLOPIDOGREL BISULFATE 75 MG PO TABS: 75.0000 mg | ORAL_TABLET | Freq: Every day | ORAL | Status: DC

## 2022-04-26 MED ORDER — AZELASTINE HCL 0.1 % NA SOLN: 1.0000 | Freq: Every day | NASAL | Status: DC

## 2022-04-26 MED ORDER — THIAMINE MONONITRATE 100 MG PO TABS
100.0000 mg | ORAL_TABLET | Freq: Every day | ORAL | Status: DC
  Administered 2022-04-26 – 2022-05-02 (×7): 100 mg via ORAL
  Filled 2022-04-26 (×7): qty 1

## 2022-04-26 MED ORDER — ADULT MULTIVITAMIN W/MINERALS CH
1.0000 | ORAL_TABLET | Freq: Every day | ORAL | Status: DC
  Administered 2022-04-26 – 2022-05-02 (×7): 1 via ORAL
  Filled 2022-04-26 (×7): qty 1

## 2022-04-26 MED ORDER — LOPERAMIDE HCL 2 MG PO CAPS: 2.0000 mg | ORAL_CAPSULE | ORAL | Status: AC | PRN

## 2022-04-26 MED ORDER — TAMSULOSIN HCL 0.4 MG PO CAPS
0.4000 mg | ORAL_CAPSULE | Freq: Every day | ORAL | Status: DC
  Administered 2022-04-26 – 2022-05-02 (×7): 0.4 mg via ORAL
  Filled 2022-04-26 (×7): qty 1

## 2022-04-26 MED ORDER — MAGNESIUM HYDROXIDE 400 MG/5ML PO SUSP
30.0000 mL | Freq: Every day | ORAL | Status: DC | PRN
  Administered 2022-04-30: 30 mL via ORAL
  Filled 2022-04-26: qty 30

## 2022-04-26 MED ORDER — CLOPIDOGREL BISULFATE 75 MG PO TABS
75.0000 mg | ORAL_TABLET | Freq: Every day | ORAL | Status: DC
  Administered 2022-04-26 – 2022-05-02 (×7): 75 mg via ORAL
  Filled 2022-04-26 (×7): qty 1

## 2022-04-26 MED ORDER — NITROGLYCERIN 0.4 MG SL SUBL
0.4000 mg | SUBLINGUAL_TABLET | SUBLINGUAL | Status: DC | PRN
  Filled 2022-04-26: qty 1

## 2022-04-26 NOTE — ED Triage Notes (Signed)
Pt presents to [facility] accompanied by his mother seeking detox and substance use treatment. Pt states he uses cocaine, THC, alcohol and herion. Pt reports last use of THC ($20 worth) and cocaine ($100 worth) yesterday. Pt states he was sent to this facility by [facility]. Pt denies SI/HI and AVH at this time.

## 2022-04-26 NOTE — ED Provider Notes (Signed)
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 not have been cleared or approved by the U.S. Food and Drug Administration. The FDA has determined that such clearance or approval is not necessary. This test is used for clinical  purposes. It should not be regarded as investigational or for research. This  laboratory is certified under the [place] (CLIA-88) as qualified to perform high complexity clinical laboratory testing.  The controls stained appropriately.   Admission on 02/14/2022, Discharged on 02/14/2022  Component Date Value Ref Range Status   Sodium 02/14/2022 139  135 - 145 mmol/L Final   Potassium 02/14/2022 4.3  3.5 - 5.1 mmol/L Final   Chloride 02/14/2022 106  98 - 111 mmol/L Final   CO2 02/14/2022 23  22 - 32 mmol/L Final   Glucose, Bld 02/14/2022 94  70 - 99 mg/dL Final   Glucose reference range applies only to samples taken after fasting for at least 8 hours.   BUN 02/14/2022 11  8 - 23 mg/dL Final   Creatinine, Ser 02/14/2022 0.94  0.61 - 1.24 mg/dL Final   Calcium 02/14/2022 9.2  8.9 - 10.3 mg/dL Final   Total Protein 02/14/2022 7.1  6.5 - 8.1 g/dL Final   Albumin 02/14/2022 3.9  3.5 - 5.0 g/dL Final   AST 02/14/2022 21  15 - 41 U/L Final   ALT 02/14/2022 14  0 - 44 U/L Final   Alkaline Phosphatase 02/14/2022 64  38 - 126 U/L Final   Total Bilirubin 02/14/2022 0.5  0.3 - 1.2 mg/dL Final   GFR, Estimated 02/14/2022 >60  >60 mL/min Final   Comment: (NOTE) Calculated using the CKD-EPI Creatinine Equation (2021)    Anion gap 02/14/2022 10  5 - 15 Final   Performed at [place] [address], [place], [place] [address]   Lipase 02/14/2022 32  11 - 51 U/L Final   Performed at [place] [address], [place], [place] [address]   WBC 02/14/2022 8.6  4.0 - 10.5 K/uL Final   RBC 02/14/2022 4.96  4.22 - 5.81 MIL/uL Final   Hemoglobin 02/14/2022 15.7  13.0 - 17.0 g/dL Final   HCT 02/14/2022 47.1  39.0 - 52.0 % Final   MCV 02/14/2022 95.0  80.0 - 100.0 fL Final   MCH 02/14/2022 31.7  26.0 - 34.0 pg Final   MCHC 02/14/2022 33.3  30.0 - 36.0 g/dL Final   RDW 02/14/2022 13.2  11.5 - 15.5 % Final   Platelets 02/14/2022 251  150 - 400 K/uL Final   nRBC 02/14/2022 0.0  0.0 - 0.2 % Final   Neutrophils Relative %  02/14/2022 65  % Final   Neutro Abs 02/14/2022 5.5  1.7 - 7.7 K/uL Final   Lymphocytes Relative 02/14/2022 25  % Final   Lymphs Abs 02/14/2022 2.2  0.7 - 4.0 K/uL Final   Monocytes Relative 02/14/2022 5  % Final   Monocytes Absolute 02/14/2022 0.4  0.1 - 1.0 K/uL Final   Eosinophils Relative 02/14/2022 4  % Final   Eosinophils Absolute 02/14/2022 0.4  0.0 - 0.5 K/uL Final   Basophils Relative 02/14/2022 1  % Final   Basophils Absolute 02/14/2022 0.1  0.0 - 0.1 K/uL Final   Immature Granulocytes 02/14/2022 0  % Final   Abs Immature Granulocytes 02/14/2022 0.02  0.00 - 0.07 K/uL Final   Performed at [facility], [place] [address] [place], [place] [address]  Admission on 12/16/2021, Discharged on 12/17/2021  Component Date Value Ref Range Status   Lipase 12/16/2021 38  11 - 51 U/L Final   Performed at [facility], [place] [address] [place], [place] [address]  Sodium 12/16/2021 138  135 - 145 mmol/L Final   Potassium 12/16/2021 3.4 (L)  3.5 - 5.1 mmol/L Final   Chloride 12/16/2021 104  98 - 111 mmol/L Final   CO2 12/16/2021 25  22 - 32 mmol/L Final   Glucose, Bld 12/16/2021 107 (H)  70 - 99 mg/dL Final   Glucose reference range applies only to samples taken after fasting for at least 8 hours.   BUN 12/16/2021 18  8 - 23 mg/dL Final   Creatinine, Ser 12/16/2021 0.77  0.61 - 1.24 mg/dL Final   Calcium 12/16/2021 8.8 (L)  8.9 - 10.3 mg/dL Final   Total Protein 12/16/2021 8.1  6.5 - 8.1 g/dL Final   Albumin 12/16/2021 4.3  3.5 - 5.0 g/dL Final   AST 12/16/2021 19  15 - 41 U/L Final   ALT 12/16/2021 16  0 - 44 U/L Final   Alkaline Phosphatase 12/16/2021 68  38 - 126 U/L Final   Total Bilirubin 12/16/2021 0.5  0.3 - 1.2 mg/dL Final   GFR, Estimated 12/16/2021 >60  >60 mL/min Final   Comment: (NOTE) Calculated using the CKD-EPI Creatinine Equation (2021)    Anion gap 12/16/2021 9  5 - 15 Final   Performed at [facility], [place] [address],  [place], [place] [address]   WBC 12/16/2021 11.7 (H)  4.0 - 10.5 K/uL Final   RBC 12/16/2021 4.07 (L)  4.22 - 5.81 MIL/uL Final   Hemoglobin 12/16/2021 13.0  13.0 - 17.0 g/dL Final   HCT 12/16/2021 37.6 (L)  39.0 - 52.0 % Final   MCV 12/16/2021 92.4  80.0 - 100.0 fL Final   MCH 12/16/2021 31.9  26.0 - 34.0 pg Final   MCHC 12/16/2021 34.6  30.0 - 36.0 g/dL Final   RDW 12/16/2021 14.1  11.5 - 15.5 % Final   Platelets 12/16/2021 293  150 - 400 K/uL Final   nRBC 12/16/2021 0.0  0.0 - 0.2 % Final   Neutrophils Relative % 12/16/2021 70  % Final   Neutro Abs 12/16/2021 8.2 (H)  1.7 - 7.7 K/uL Final   Lymphocytes Relative 12/16/2021 23  % Final   Lymphs Abs 12/16/2021 2.6  0.7 - 4.0 K/uL Final   Monocytes Relative 12/16/2021 6  % Final   Monocytes Absolute 12/16/2021 0.7  0.1 - 1.0 K/uL Final   Eosinophils Relative 12/16/2021 1  % Final   Eosinophils Absolute 12/16/2021 0.1  0.0 - 0.5 K/uL Final   Basophils Relative 12/16/2021 0  % Final   Basophils Absolute 12/16/2021 0.0  0.0 - 0.1 K/uL Final   Immature Granulocytes 12/16/2021 0  % Final   Abs Immature Granulocytes 12/16/2021 0.04  0.00 - 0.07 K/uL Final   Performed at [facility], [place] [address], [place], [place] [address]   Color, Urine 12/16/2021 YELLOW  YELLOW Final   APPearance 12/16/2021 CLEAR  CLEAR Final   Specific Gravity, Urine 12/16/2021 1.015  1.005 - 1.030 Final   pH 12/16/2021 7.0  5.0 - 8.0 Final   Glucose, UA 12/16/2021 NEGATIVE  NEGATIVE mg/dL Final   Hgb urine dipstick 12/16/2021 SMALL (A)  NEGATIVE Final   Bilirubin Urine 12/16/2021 NEGATIVE  NEGATIVE Final   Ketones, ur 12/16/2021 NEGATIVE  NEGATIVE mg/dL Final   Protein, ur 12/16/2021 NEGATIVE  NEGATIVE mg/dL Final   Nitrite 12/16/2021 NEGATIVE  NEGATIVE Final   Leukocytes,Ua 12/16/2021 NEGATIVE  NEGATIVE Final   RBC / HPF 12/16/2021 6-10  0 - 5 RBC/hpf  Final   WBC, UA 12/16/2021 0-5  0 - 5 WBC/hpf Final   Bacteria, UA 12/16/2021 NONE SEEN  NONE SEEN  Final   Mucus 12/16/2021 PRESENT   Final   Amorphous Crystal 12/16/2021 PRESENT   Final   Performed at [facility], [place] [address], [place], [place] [address]   Total CK 12/16/2021 99  49 - 397 U/L Final   Performed at [facility], [place] [address] [place], [place] [address]   Troponin I (High Sensitivity) 12/16/2021 23 (H)  <18 ng/L Final   Comment: (NOTE) Elevated high sensitivity troponin I (hsTnI) values and significant  changes across serial measurements may suggest ACS but many other  chronic and acute conditions are known to elevate hsTnI results.  Refer to the "Links" section for chest pain algorithms and additional  guidance. Performed at [facility], [place] [address] [place], [place] [address]    Troponin I (High Sensitivity) 12/17/2021 48 (H)  <18 ng/L Final   Comment: DELTA CHECK NOTED (NOTE) Elevated high sensitivity troponin I (hsTnI) values and significant  changes across serial measurements may suggest ACS but many other  chronic and acute conditions are known to elevate hsTnI results.  Refer to the Links section for chest pain algorithms and additional  guidance. Performed at [facility], [place] [address], [place], [place] [address]    HIV Screen 4th Generation wRfx 12/17/2021 Non Reactive  Non Reactive Final   Performed at [facility], [place] [address] [place], [place] [address]   Lipoprotein (a) 12/17/2021 27.0  <75.0 nmol/L Final   Comment: (NOTE) Note:  Values greater than or equal to 75.0 nmol/L may       indicate an independent risk factor for CHD,       but must be evaluated with caution when applied       to non-Caucasian populations due to the       influence of genetic factors on Lp(a) across       ethnicities. Performed At: [facility] [place], [place] [id number] Rush Farmer MD [id number]    Opiates 12/16/2021 NONE DETECTED  NONE DETECTED  Final   Cocaine 12/16/2021 NONE DETECTED  NONE DETECTED Final   Benzodiazepines 12/16/2021 NONE DETECTED  NONE DETECTED Final   Amphetamines 12/16/2021 NONE DETECTED  NONE DETECTED Final   Tetrahydrocannabinol 12/16/2021 NONE DETECTED  NONE DETECTED Final   Barbiturates 12/16/2021 NONE DETECTED  NONE DETECTED Final   Comment: (NOTE) DRUG SCREEN FOR MEDICAL PURPOSES ONLY.  IF CONFIRMATION IS NEEDED FOR ANY PURPOSE, NOTIFY LAB WITHIN 5 DAYS.  LOWEST DETECTABLE LIMITS FOR URINE DRUG SCREEN Drug Class                     Cutoff (ng/mL) Amphetamine and metabolites    1000 Barbiturate and metabolites    200 Benzodiazepine                 585 Tricyclics and metabolites     300 Opiates and metabolites        300 Cocaine and metabolites        300 THC                            50 Performed at [facility], [place] [address], [place], [place] [address]    SARS Coronavirus 2 by RT PCR 12/17/2021 NEGATIVE  NEGATIVE Final   Comment: (NOTE) SARS-CoV-2 target nucleic acids are NOT DETECTED.  The SARS-CoV-2 RNA is generally detectable in upper and lower respiratory specimens during the acute phase of infection. The lowest concentration of SARS-CoV-2 viral copies this assay can detect is 250 copies / mL. A negative result does not preclude SARS-CoV-2 infection and should not be used as the sole basis for treatment or other patient management decisions.  A negative result may occur with improper specimen collection / handling, submission of specimen other than nasopharyngeal swab, presence of viral mutation(s) within the areas targeted by this assay, and inadequate number of viral copies (<250 copies / mL). A negative result must be combined with clinical observations, patient history, and epidemiological information.  Fact Sheet for Patients:   https://www.patel.info/  Fact Sheet for Healthcare Providers: https://hall.com/  This  test is not yet approved or                           cleared by the [place] FDA and has been authorized for detection and/or diagnosis of SARS-CoV-2 by FDA under an Emergency Use Authorization (EUA).  This EUA will remain in effect (meaning this test can be used) for the duration of the COVID-19 declaration under Section 564(b)(1) of the Act, 21 U.S.C. section 360bbb-3(b)(1), unless the authorization is terminated or revoked sooner.  Performed at [facility], [place] [address] [place], [place] [address]    Sodium 12/17/2021 137  135 - 145 mmol/L Final   Potassium 12/17/2021 3.5  3.5 - 5.1 mmol/L Final   Chloride 12/17/2021 103  98 - 111 mmol/L Final   CO2 12/17/2021 27  22 - 32 mmol/L Final   Glucose, Bld 12/17/2021 84  70 - 99 mg/dL Final   Glucose reference range applies only to samples taken after fasting for at least 8 hours.   BUN 12/17/2021 13  8 - 23 mg/dL Final   Creatinine, Ser 12/17/2021 0.85  0.61 - 1.24 mg/dL Final   Calcium 12/17/2021 8.6 (L)  8.9 - 10.3 mg/dL Final   Total Protein 12/17/2021 6.2 (L)  6.5 - 8.1 g/dL Final   Albumin 12/17/2021 3.2 (L)  3.5 - 5.0 g/dL Final   AST 12/17/2021 17  15 - 41 U/L Final   ALT 12/17/2021 15  0 - 44 U/L Final   Alkaline Phosphatase 12/17/2021 56  38 - 126 U/L Final   Total Bilirubin 12/17/2021 0.4  0.3 - 1.2 mg/dL Final   GFR, Estimated 12/17/2021 >60  >60 mL/min Final   Comment: (NOTE) Calculated using the CKD-EPI Creatinine Equation (2021)    Anion gap 12/17/2021 7  5 - 15 Final   Performed at [place] [address] [place], [place] [address]   Heparin Unfractionated 12/17/2021 <0.10 (L)  0.30 - 0.70 IU/mL Final   Comment: (NOTE) The clinical reportable range upper limit is being lowered to >1.10 to align with the FDA approved guidance for the current laboratory assay.  If heparin results are below expected values, and patient dosage has  been confirmed, suggest follow up testing of  antithrombin III levels. Performed at [facility], [place] [address], [place], [place] [address]    WBC 12/17/2021 7.4  4.0 - 10.5 K/uL Final   RBC 12/17/2021 3.72 (L)  4.22 - 5.81 MIL/uL Final   Hemoglobin 12/17/2021 11.4 (L)  13.0 - 17.0 g/dL Final   HCT 12/17/2021 35.4 (L)  39.0 - 52.0 % Final   MCV 12/17/2021 95.2  80.0 - 100.0 fL Final   MCH 12/17/2021 30.6  26.0 - 34.0 pg Final   MCHC 12/17/2021 32.2  30.0 - 36.0 g/dL Final   RDW 12/17/2021 14.3  11.5 - 15.5 % Final   Platelets 12/17/2021 241  150 - 400 K/uL Final   nRBC 12/17/2021 0.0  0.0 - 0.2 % Final   Performed at [facility], [place] [address], [place], [place] [address]   Allergies: Anette Guarneri [lurasidone hcl] and Saphris [asenapine] PTA Medications: (Not in a hospital admission)   Medical Decision Making      Substance use disorder (THC, cocaine, heroine, alcohol) - Last drink 4 days ago  Plan for detox before [facility]. [facility] in [place], [place], [place] and [place]. No available beds at this time.  Patient has discontinued seroquel, zoloft and buspar previously  Seroquel side effects of restless leg and insomnia  COWS protocol, no clonidine Continue suboxone- been prescribed regularly outpatient  CIWA with PRN's, no scheduled benzo's at this time  Started multivitamin and thiamine  Schizoaffective disorder (bipolar type)/PTSD Currently not in psychosis, reported adherence to VPA  Continue home meds - Valproic Acid 250 mg BID, cymbalta 60 mg daily  VPA level ordered 04/26/2022  Restless Leg  Continue Ropinirole 2 mg QHS  Rule out iron deficiency- ferritin level ordered 04/26/2022  Dispo Patient [facility] here by [facility] for [facility] however [facility] is full. Contacted other [facility]BC's which are also full. Would like to admit [facility] our [facility] when available Location: [facility], Date TBA  Total Time spent with patient: 30 minutes Recommendations  Based on my evaluation the patient does not  appear to have an emergency medical condition.  Per above     Labs Reviewed  SARS CORONAVIRUS 2 BY RT PCR  CBC WITH DIFFERENTIAL/PLATELET  COMPREHENSIVE METABOLIC PANEL  HEMOGLOBIN A1C  MAGNESIUM  LIPID PANEL  TSH  VALPROIC ACID LEVEL  FERRITIN  POC SARS CORONAVIRUS 2 AG -  ED  POC SARS CORONAVIRUS 2 AG  POCT URINE DRUG SCREEN - MANUAL ENTRY (I-SCREEN)     Signed: William Hamburger PA-S, discussed with resident per below   I was present for the entirety of the evaluation on 04/26/2022. I reviewed the patient's chart, and I participated in key portions of the service and interview. I discussed the case with the PA student, and I agree with the assessment and plan of care as documented in the PA student's note.   Merrily Brittle, DO

## 2022-04-26 NOTE — ED Notes (Signed)
Pt arrived to the unit.

## 2022-04-27 ENCOUNTER — Encounter (HOSPITAL_COMMUNITY): Payer: Self-pay | Admitting: Emergency Medicine

## 2022-04-27 NOTE — ED Notes (Signed)
Pt requesting additional snack affect remains flat mood pleasant.

## 2022-04-27 NOTE — ED Provider Notes (Signed)
Behavioral Health Progress Note  Date and Time: 04/27/2022 12:19 PM Name: Keith Gibbs MRN:  [id number]  Subjective:  Keith Gibbs is a 61 year old Caucasian male who is seeking residential treatment for alcohol abuse. Continues to deny suicidal or homicidal ideations.  Denies auditory visual hallucinations.  Reports his last drink was 4  or 5 days ago.  Denies alcohol cravings or any withdrawal symptoms.  Reports he is followed by therapy and psychiatry at the [facility] urgent care facility.  Stated that  he has not followed up in quite some time.  Patient reports he would like to continue to wait for a bed to become available.  During evaluation Keith Gibbs is sitting in no acute distress. He is alert/oriented x 4; calm/cooperative; and mood congruent with affect. HE is speaking in a clear tone at moderate volume, and normal pace; with good eye contact.  His thought process is coherent and relevant; There is no indication that he  is currently responding to internal/external stimuli or experiencing delusional thought content; and He has denied suicidal/self-harm/homicidal ideation, psychosis, and paranoia. Patient has remained calm throughout assessment and has answered questions appropriately.      Diagnosis:  Final diagnoses:  None    Total Time spent with patient: 15 minutes  Past Psychiatric History:  Past Medical History:  Past Medical History:  Diagnosis Date   Anginal pain ([place])    Anxiety    Arthritis    Bipolar 1 disorder ([place])    Bursitis    CAD (coronary artery disease)    Chronic pain    COPD (chronic obstructive pulmonary disease) (HCC)    Current use of long term anticoagulation    DAPT (ASA + clopidogrel)   Depression    Diverticulitis    Dyspnea    GERD (gastroesophageal reflux disease)    Grade I diastolic dysfunction    Hepatitis C 2012   No longer has Hep C   HLD (hyperlipidemia)    Hypertension    MI (myocardial infarction) ([place])     Polysubstance abuse (HCC)    cocaine, marijuana, ETOH   PUD (peptic ulcer disease)    S/P angioplasty with stent 06/10/2016   a.) 90% stenosis of pLAD to mLAD - 2.5 x 18 mm Xience Alpine (DES x 1) placed to pLAD   S/P PTCA (percutaneous transluminal coronary angioplasty) 12/04/2019   a.) 60% in stent restenosis of DES to pLAD; LVEF 65%.   Schizophrenia ([place])    Stroke [facility])    Valvular insufficiency    a.) Mild MR, TR, PR; mild to moderate AR on 03/05/2018 TTE    Past Surgical History:  Procedure Laterality Date   ABDOMINAL SURGERY     removed small piece of intestines due to [facility] Diverticulosis   APPENDECTOMY     BACK SURGERY     CARDIAC CATHETERIZATION Left 06/10/2016   Procedure: Left Heart Cath and Coronary Angiography;  Surgeon: Dionisio David, MD;  Location: [place] [facility];  Service: Cardiovascular;  Laterality: Left;   CARDIAC CATHETERIZATION N/A 06/10/2016   Procedure: Coronary Stent Intervention;  Surgeon: Yolonda Kida, MD;  Location: [place] [facility];  Service: Cardiovascular;  Laterality: N/A;   CHOLECYSTECTOMY N/A 02/17/2022   Procedure: LAPAROSCOPIC CHOLECYSTECTOMY;  Surgeon: Kieth Brightly Arta Bruce, MD;  Location: [place];  Service: General;  Laterality: N/A;   COLON SURGERY     COLONOSCOPY     COLONOSCOPY WITH PROPOFOL N/A 01/05/2017   Procedure: COLONOSCOPY WITH PROPOFOL;  Surgeon: Jonathon Bellows, MD;  Location: [facility] ENDOSCOPY;  Service: Endoscopy;  Laterality: N/A;   COLONOSCOPY WITH PROPOFOL N/A 02/13/2020   Procedure: COLONOSCOPY WITH PROPOFOL;  Surgeon: Virgel Manifold, MD;  Location: [facility] ENDOSCOPY;  Service: Endoscopy;  Laterality: N/A;   CORONARY ANGIOPLASTY WITH STENT PLACEMENT     ESOPHAGOGASTRODUODENOSCOPY (EGD) WITH PROPOFOL N/A 01/05/2017   Procedure: ESOPHAGOGASTRODUODENOSCOPY (EGD) WITH PROPOFOL;  Surgeon: Jonathon Bellows, MD;  Location: [facility] ENDOSCOPY;  Service: Endoscopy;  Laterality: N/A;   ESOPHAGOGASTRODUODENOSCOPY (EGD) WITH PROPOFOL N/A  02/13/2020   Procedure: ESOPHAGOGASTRODUODENOSCOPY (EGD) WITH PROPOFOL;  Surgeon: Virgel Manifold, MD;  Location: [facility] ENDOSCOPY;  Service: Endoscopy;  Laterality: N/A;   INTRAVASCULAR PRESSURE WIRE/FFR STUDY N/A 12/04/2019   Procedure: INTRAVASCULAR PRESSURE WIRE/FFR STUDY;  Surgeon: Sherren Mocha, MD;  Location: [place] [facility];  Service: Cardiovascular;  Laterality: N/A;   KNEE ARTHROSCOPY WITH MEDIAL MENISECTOMY Right 09/05/2017   Procedure: KNEE ARTHROSCOPY WITH MEDIAL AND LATERAL  MENISECTOMY PARTIAL SYNOVECTOMY;  Surgeon: Hessie Knows, MD;  Location: [facility] ORS;  Service: Orthopedics;  Laterality: Right;   LEFT HEART CATH AND CORONARY ANGIOGRAPHY N/A 12/04/2019   Procedure: LEFT HEART CATH AND CORONARY ANGIOGRAPHY;  Surgeon: Sherren Mocha, MD;  Location: [place] [facility];  Service: Cardiovascular;  Laterality: N/A;   SHOULDER SURGERY Right 04/09/2012   SPINE SURGERY     TOTAL KNEE ARTHROPLASTY Right 01/19/2021   Procedure: TOTAL KNEE ARTHROPLASTY - Rachelle Hora to Assist;  Surgeon: Hessie Knows, MD;  Location: [facility] ORS;  Service: Orthopedics;  Laterality: Right;   Family History:  Family History  Problem Relation Age of Onset   Osteoarthritis Mother    Heart disease Mother    Hypertension Mother    Depression Mother    Heart disease Father    Early death Father    Hypertension Father    Heart attack Father    Hypertension Sister    Prostate cancer Neg Hx    Bladder Cancer Neg Hx    Kidney cancer Neg Hx    Family Psychiatric  History:  Social History:  Social History   Substance and Sexual Activity  Alcohol Use Yes   Alcohol/week: 6.0 standard drinks of alcohol   Types: 6 Cans of beer per week   Comment: occassionally      Social History   Substance and Sexual Activity  Drug Use Not Currently   Types: Cocaine, Marijuana   Comment: last on saturday    Social History   Socioeconomic History   Marital status: Widowed    Spouse name: Not on file    Number of children: Not on file   Years of education: Not on file   Highest education level: Not on file  Occupational History   Occupation: disability   Occupation: stocking at gas station    Comment: 2 days per week  Tobacco Use   Smoking status: Some Days    Packs/day: 0.50    Years: 32.00    Total pack years: 16.00    Types: Cigarettes    Last attempt to quit: 10/25/2021    Years since quitting: 0.5   Smokeless tobacco: Never   Tobacco comments:    Continues to smoke approximately 5 cigarettes per day.  Has been 2 days since las cigarette.  Has not bought any more.  Using patches and gum.  5/23 hfb  Vaping Use   Vaping Use: Never used  Substance and Sexual Activity   Alcohol use: Yes    Alcohol/week: 6.0  standard drinks of alcohol    Types: 6 Cans of beer per week    Comment: occassionally    Drug use: Not Currently    Types: Cocaine, Marijuana    Comment: last on saturday   Sexual activity: Yes    Partners: Female    Birth control/protection: None  Other Topics Concern   Not on file  Social History Narrative   Not on file   Social Determinants of Health   Financial Resource Strain: Not on file  Food Insecurity: Not on file  Transportation Needs: Not on file  Physical Activity: Not on file  Stress: Not on file  Social Connections: Not on file   SDOH:  SDOH Screenings   Alcohol Screen: Low Risk  (06/21/2021)  Depression (PHQ2-9): Medium Risk (01/03/2022)  Tobacco Use: High Risk (02/28/2022)   Additional Social History:                         Sleep: Fair  Appetite:  Good  Current Medications:  Current Facility-Administered Medications  Medication Dose Route Frequency Provider Last Rate Last Admin   acetaminophen (TYLENOL) tablet 650 mg  650 mg Oral Q6H PRN Merrily Brittle, DO   650 mg at 04/27/22 0357   alum & mag hydroxide-simeth (MAALOX/MYLANTA) 200-200-20 MG/5ML suspension 30 mL  30 mL Oral Q4H PRN Merrily Brittle, DO       aspirin EC tablet 81  mg  81 mg Oral Daily Merrily Brittle, DO   81 mg at 04/27/22 1007   atorvastatin (LIPITOR) tablet 80 mg  80 mg Oral QHS Merrily Brittle, DO   80 mg at 04/26/22 2202   Budeson-Glycopyrrol-Formoterol 160-9-4.8 MCG/ACT AERO 2 puff  2 puff Mouth/Throat Q12H Merrily Brittle, DO   2 puff at 04/27/22 1016   buprenorphine-naloxone (SUBOXONE) 8-2 mg per SL tablet 1 tablet  1 tablet Sublingual BID Merrily Brittle, DO   1 tablet at 04/27/22 1009   clopidogrel (PLAVIX) tablet 75 mg  75 mg Oral Daily Merrily Brittle, DO   75 mg at 04/27/22 1006   diclofenac Sodium (VOLTAREN) 1 % topical gel 2 g  2 g Topical QID Merrily Brittle, DO   2 g at 04/27/22 1009   divalproex (DEPAKOTE) DR tablet 250 mg  250 mg Oral BID Merrily Brittle, DO   250 mg at 04/27/22 1005   DULoxetine (CYMBALTA) DR capsule 60 mg  60 mg Oral Daily Merrily Brittle, DO   60 mg at 04/27/22 1004   enalapril (VASOTEC) tablet 2.5 mg  2.5 mg Oral BID Merrily Brittle, DO   2.5 mg at 04/27/22 1006   fluticasone (FLONASE) 50 MCG/ACT nasal spray 1 spray  1 spray Each Nare QHS Merrily Brittle, DO   1 spray at 04/26/22 2203   hydrOXYzine (ATARAX) tablet 25 mg  25 mg Oral Q6H PRN Merrily Brittle, DO       loperamide (IMODIUM) capsule 2-4 mg  2-4 mg Oral PRN Merrily Brittle, DO       loratadine (CLARITIN) tablet 10 mg  10 mg Oral Daily Merrily Brittle, DO   10 mg at 04/27/22 1005   LORazepam (ATIVAN) tablet 1 mg  1 mg Oral Q6H PRN Merrily Brittle, DO       magnesium hydroxide (MILK OF MAGNESIA) suspension 30 mL  30 mL Oral Daily PRN Merrily Brittle, DO       meloxicam (MOBIC) tablet 7.5 mg  7.5 mg Oral Daily Merrily Brittle, DO   7.5  mg at 04/27/22 1016   multivitamin with minerals tablet 1 tablet  1 tablet Oral Daily Merrily Brittle, DO   1 tablet at 04/27/22 1005   nicotine (NICODERM CQ - dosed in mg/24 hours) patch 21 mg  21 mg Transdermal Daily Revonda Humphrey, NP   21 mg at 04/27/22 1017   nicotine polacrilex (NICORETTE) gum 4 mg  4 mg Oral PRN Merrily Brittle, DO       nitroGLYCERIN  (NITROSTAT) SL tablet 0.4 mg  0.4 mg Sublingual Q5 min PRN Merrily Brittle, DO       omega-3 acid ethyl esters (LOVAZA) capsule 1 g  1 g Oral Daily Merrily Brittle, DO   1 g at 04/27/22 1007   ondansetron (ZOFRAN-ODT) disintegrating tablet 4 mg  4 mg Oral Q6H PRN Merrily Brittle, DO       rOPINIRole (REQUIP) tablet 2 mg  2 mg Oral QHS Merrily Brittle, DO   2 mg at 04/26/22 2202   tamsulosin (FLOMAX) capsule 0.4 mg  0.4 mg Oral Daily Merrily Brittle, DO   0.4 mg at 04/27/22 1007   thiamine (VITAMIN B1) tablet 100 mg  100 mg Oral Daily Merrily Brittle, DO   100 mg at 04/27/22 1004   traZODone (DESYREL) tablet 50 mg  50 mg Oral QHS PRN Merrily Brittle, DO       Current Outpatient Medications  Medication Sig Dispense Refill   buprenorphine-naloxone (SUBOXONE) 8-2 mg SUBL SL tablet Place 1 tablet under the tongue 2 (two) times daily.     clopidogrel (PLAVIX) 75 MG tablet Take 75 mg by mouth daily.     DULoxetine (CYMBALTA) 60 MG capsule Take 60 mg by mouth daily.     enalapril (VASOTEC) 2.5 MG tablet Take 2.5 mg by mouth 2 (two) times daily.     meloxicam (MOBIC) 15 MG tablet Take 15 mg by mouth daily.     acetaminophen (TYLENOL) 500 MG tablet Take 2 tablets (1,000 mg total) by mouth every 6 (six) hours. (Patient taking differently: Take 1,000 mg by mouth daily as needed for headache.) 30 tablet 0   albuterol (VENTOLIN HFA) 108 (90 Base) MCG/ACT inhaler Inhale 2 puffs into the lungs every 6 (six) hours as needed for wheezing or shortness of breath. 18 g 6   aspirin EC 81 MG tablet Take 1 tablet (81 mg total) by mouth daily. Swallow whole. 30 tablet 12   atorvastatin (LIPITOR) 80 MG tablet Take 80 mg by mouth at bedtime.     cetirizine (ZYRTEC) 10 MG tablet TAKE 1 TABLET BY MOUTH EVERY DAY (Patient taking differently: Take 10 mg by mouth daily.) 90 tablet 0   divalproex (DEPAKOTE) 250 MG DR tablet Take 1 tablet (250 mg total) by mouth 2 (two) times daily. 60 tablet 2   fluticasone (FLONASE) 50 MCG/ACT nasal spray  Place 1 spray into both nostrils at bedtime.     Multiple Vitamin (MULTIVITAMIN) tablet Take 1 tablet by mouth daily.     nitroGLYCERIN (NITROSTAT) 0.3 MG SL tablet Place 0.3 mg under the tongue every 5 (five) minutes as needed for chest pain. (Patient not taking: Reported on 02/28/2022)     Omega-3 Fatty Acids (FISH OIL) 1200 MG CAPS Take 1,200 mg by mouth daily.     rOPINIRole (REQUIP) 2 MG tablet TAKE 1 TABLET BY MOUTH EVERYDAY AT BEDTIME (Patient taking differently: Take 2 mg by mouth at bedtime.) 30 tablet 0    Labs  Lab Results:  Admission on 04/26/2022  Component Date Value Ref Range Status   WBC 04/26/2022 7.3  4.0 - 10.5 K/uL Final   RBC 04/26/2022 4.02 (L)  4.22 - 5.81 MIL/uL Final   Hemoglobin 04/26/2022 12.8 (L)  13.0 - 17.0 g/dL Final   HCT 04/26/2022 37.6 (L)  39.0 - 52.0 % Final   MCV 04/26/2022 93.5  80.0 - 100.0 fL Final   MCH 04/26/2022 31.8  26.0 - 34.0 pg Final   MCHC 04/26/2022 34.0  30.0 - 36.0 g/dL Final   RDW 04/26/2022 13.6  11.5 - 15.5 % Final   Platelets 04/26/2022 242  150 - 400 K/uL Final   nRBC 04/26/2022 0.0  0.0 - 0.2 % Final   Neutrophils Relative % 04/26/2022 51  % Final   Neutro Abs 04/26/2022 3.7  1.7 - 7.7 K/uL Final   Lymphocytes Relative 04/26/2022 34  % Final   Lymphs Abs 04/26/2022 2.4  0.7 - 4.0 K/uL Final   Monocytes Relative 04/26/2022 6  % Final   Monocytes Absolute 04/26/2022 0.5  0.1 - 1.0 K/uL Final   Eosinophils Relative 04/26/2022 9  % Final   Eosinophils Absolute 04/26/2022 0.6 (H)  0.0 - 0.5 K/uL Final   Basophils Relative 04/26/2022 0  % Final   Basophils Absolute 04/26/2022 0.0  0.0 - 0.1 K/uL Final   Immature Granulocytes 04/26/2022 0  % Final   Abs Immature Granulocytes 04/26/2022 0.01  0.00 - 0.07 K/uL Final   Performed at [facility], [place] [address] [place], [place] [address]   Sodium 04/26/2022 140  135 - 145 mmol/L Final   Potassium 04/26/2022 3.6  3.5 - 5.1 mmol/L Final   Chloride 04/26/2022 105  98 - 111 mmol/L  Final   CO2 04/26/2022 26  22 - 32 mmol/L Final   Glucose, Bld 04/26/2022 92  70 - 99 mg/dL Final   Glucose reference range applies only to samples taken after fasting for at least 8 hours.   BUN 04/26/2022 11  8 - 23 mg/dL Final   Creatinine, Ser 04/26/2022 0.66  0.61 - 1.24 mg/dL Final   Calcium 04/26/2022 9.0  8.9 - 10.3 mg/dL Final   Total Protein 04/26/2022 6.3 (L)  6.5 - 8.1 g/dL Final   Albumin 04/26/2022 3.4 (L)  3.5 - 5.0 g/dL Final   AST 04/26/2022 18  15 - 41 U/L Final   ALT 04/26/2022 30  0 - 44 U/L Final   Alkaline Phosphatase 04/26/2022 83  38 - 126 U/L Final   Total Bilirubin 04/26/2022 <0.1 (L)  0.3 - 1.2 mg/dL Final   GFR, Estimated 04/26/2022 >60  >60 mL/min Final   Comment: (NOTE) Calculated using the CKD-EPI Creatinine Equation (2021)    Anion gap 04/26/2022 9  5 - 15 Final   Performed at [facility], [place] [address] [place], [place] [address]   Hgb A1c MFr Bld 04/26/2022 5.2  4.8 - 5.6 % Final   Comment: (NOTE) Pre diabetes:          5.7%-6.4%  Diabetes:              >6.4%  Glycemic control for   <7.0% adults with diabetes    Mean Plasma Glucose 04/26/2022 102.54  mg/dL Final   Performed at [facility], [place] [address], [place], [place] [address]   Magnesium 04/26/2022 1.9  1.7 - 2.4 mg/dL Final   Performed at [place] [address] [place], [place] [address]  Cholesterol 04/26/2022 83  0 - 200 mg/dL Final   Triglycerides 04/26/2022 52  <150 mg/dL Final   HDL 04/26/2022 28 (L)  >40 mg/dL Final   Total CHOL/HDL Ratio 04/26/2022 3.0  RATIO Final   VLDL 04/26/2022 10  0 - 40 mg/dL Final   LDL Cholesterol 04/26/2022 45  0 - 99 mg/dL Final   Comment:        Total Cholesterol/HDL:CHD Risk Coronary Heart Disease Risk Table                     Men   Women  1/2 Average Risk   3.4   3.3  Average Risk       5.0   4.4  2 X Average Risk   9.6   7.1  3 X Average Risk  23.4   11.0        Use the calculated Patient Ratio above and the  CHD Risk Table to determine the patient's CHD Risk.        ATP III CLASSIFICATION (LDL):  <100     mg/dL   Optimal  100-129  mg/dL   Near or Above                    Optimal  130-159  mg/dL   Borderline  160-189  mg/dL   High  >190     mg/dL   Very High Performed at [place] [address] [place], [place] [address]    TSH 04/26/2022 0.590  0.350 - 4.500 uIU/mL Final   Comment: Performed by a 3rd Generation assay with a functional sensitivity of <=0.01 uIU/mL. Performed at [facility], [place] [address], [place], [place] [address]    POC Amphetamine UR 04/26/2022 None Detected  NONE DETECTED (Cut Off Level 1000 ng/mL) Final   POC Secobarbital (BAR) 04/26/2022 None Detected  NONE DETECTED (Cut Off Level 300 ng/mL) Final   POC Buprenorphine (BUP) 04/26/2022 None Detected  NONE DETECTED (Cut Off Level 10 ng/mL) Final   POC Oxazepam (BZO) 04/26/2022 None Detected  NONE DETECTED (Cut Off Level 300 ng/mL) Final   POC Cocaine UR 04/26/2022 None Detected  NONE DETECTED (Cut Off Level 300 ng/mL) Final   POC Methamphetamine UR 04/26/2022 None Detected  NONE DETECTED (Cut Off Level 1000 ng/mL) Final   POC Morphine 04/26/2022 None Detected  NONE DETECTED (Cut Off Level 300 ng/mL) Final   POC Methadone UR 04/26/2022 None Detected  NONE DETECTED (Cut Off Level 300 ng/mL) Final   POC Oxycodone UR 04/26/2022 None Detected  NONE DETECTED (Cut Off Level 100 ng/mL) Final   POC Marijuana UR 04/26/2022 Positive (A)  NONE DETECTED (Cut Off Level 50 ng/mL) Final   Valproic Acid Lvl 04/26/2022 22 (L)  50.0 - 100.0 ug/mL Final   Performed at [facility], [place] [address] [place], [place] [address]   Ferritin 04/26/2022 80  24 - 336 ng/mL Final   Performed at [place] [address], [place], [place] [address]   SARS Coronavirus 2 by RT PCR 04/26/2022 NEGATIVE  NEGATIVE Final   Comment: (NOTE) SARS-CoV-2 target nucleic acids are NOT DETECTED.  The SARS-CoV-2 RNA is generally  detectable in upper and lower respiratory specimens during the acute phase of infection. The lowest concentration of SARS-CoV-2 viral copies this assay can detect is 250 copies / mL. A negative result does not preclude SARS-CoV-2 infection and should not be used as  the sole basis for treatment or other patient management decisions.  A negative result may occur with improper specimen collection / handling, submission of specimen other than nasopharyngeal swab, presence of viral mutation(s) within the areas targeted by this assay, and inadequate number of viral copies (<250 copies / mL). A negative result must be combined with clinical observations, patient history, and epidemiological information.  Fact Sheet for Patients:   https://www.patel.info/  Fact Sheet for Healthcare Providers: https://hall.com/  This test is not yet approved or                           cleared by the [place] FDA and has been authorized for detection and/or diagnosis of SARS-CoV-2 by FDA under an Emergency Use Authorization (EUA).  This EUA will remain in effect (meaning this test can be used) for the duration of the COVID-19 declaration under Section 564(b)(1) of the Act, 21 U.S.C. section 360bbb-3(b)(1), unless the authorization is terminated or revoked sooner.  Performed at [facility], [place] [address], [place], [place] [address]    SARSCOV2ONAVIRUS 2 AG 04/26/2022 NEGATIVE  NEGATIVE Final   Comment: (NOTE) SARS-CoV-2 antigen NOT DETECTED.   Negative results are presumptive.  Negative results do not preclude SARS-CoV-2 infection and should not be used as the sole basis for treatment or other patient management decisions, including infection  control decisions, particularly in the presence of clinical signs and  symptoms consistent with COVID-19, or in those who have been in contact with the virus.  Negative results must be combined with clinical  observations, patient history, and epidemiological information. The expected result is Negative.  Fact Sheet for Patients: HandmadeRecipes.com.cy  Fact Sheet for Healthcare Providers: FuneralLife.at  This test is not yet approved or cleared by the [place] FDA and  has been authorized for detection and/or diagnosis of SARS-CoV-2 by FDA under an Emergency Use Authorization (EUA).  This EUA will remain in effect (meaning this test can be used) for the duration of  the COV                          ID-19 declaration under Section 564(b)(1) of the Act, 21 U.S.C. section 360bbb-3(b)(1), unless the authorization is terminated or revoked sooner.    Admission on 02/28/2022, Discharged on 03/02/2022  Component Date Value Ref Range Status   Color, Urine 02/28/2022 YELLOW  YELLOW Final   APPearance 02/28/2022 CLEAR  CLEAR Final   Specific Gravity, Urine 02/28/2022 1.023  1.005 - 1.030 Final   pH 02/28/2022 7.0  5.0 - 8.0 Final   Glucose, UA 02/28/2022 NEGATIVE  NEGATIVE mg/dL Final   Hgb urine dipstick 02/28/2022 SMALL (A)  NEGATIVE Final   Bilirubin Urine 02/28/2022 NEGATIVE  NEGATIVE Final   Ketones, ur 02/28/2022 5 (A)  NEGATIVE mg/dL Final   Protein, ur 02/28/2022 NEGATIVE  NEGATIVE mg/dL Final   Nitrite 02/28/2022 NEGATIVE  NEGATIVE Final   Leukocytes,Ua 02/28/2022 NEGATIVE  NEGATIVE Final   RBC / HPF 02/28/2022 11-20  0 - 5 RBC/hpf Final   WBC, UA 02/28/2022 0-5  0 - 5 WBC/hpf Final   Bacteria, UA 02/28/2022 NONE SEEN  NONE SEEN Final   Mucus 02/28/2022 PRESENT   Final   Performed at [facility], [place] [address], [place], [place] [address]   Specimen Description 02/28/2022    Final  Value:URINE, CLEAN CATCH Performed at [facility], [place] [address], [place], [place] [address]    Special Requests 02/28/2022    Final                   Value:NONE Performed at [facility], [place] [address], [place], [place] [address]    Culture 02/28/2022    Final                   Value:NO GROWTH Performed at [facility], [place] [address], [place], [place] [address]    Report Status 02/28/2022 03/03/2022 FINAL   Final   Sodium 02/28/2022 137  135 - 145 mmol/L Final   Potassium 02/28/2022 3.7  3.5 - 5.1 mmol/L Final   Chloride 02/28/2022 104  98 - 111 mmol/L Final   CO2 02/28/2022 24  22 - 32 mmol/L Final   Glucose, Bld 02/28/2022 106 (H)  70 - 99 mg/dL Final   Glucose reference range applies only to samples taken after fasting for at least 8 hours.   BUN 02/28/2022 12  8 - 23 mg/dL Final   Creatinine, Ser 02/28/2022 0.77  0.61 - 1.24 mg/dL Final   Calcium 02/28/2022 8.9  8.9 - 10.3 mg/dL Final   Total Protein 02/28/2022 7.5  6.5 - 8.1 g/dL Final   Albumin 02/28/2022 3.6  3.5 - 5.0 g/dL Final   AST 02/28/2022 25  15 - 41 U/L Final   ALT 02/28/2022 36  0 - 44 U/L Final   Alkaline Phosphatase 02/28/2022 112  38 - 126 U/L Final   Total Bilirubin 02/28/2022 0.6  0.3 - 1.2 mg/dL Final   GFR, Estimated 02/28/2022 >60  >60 mL/min Final   Comment: (NOTE) Calculated using the CKD-EPI Creatinine Equation (2021)    Anion gap 02/28/2022 9  5 - 15 Final   Performed at [facility], [place] [address], [place], [place] [address]   WBC 02/28/2022 10.0  4.0 - 10.5 K/uL Final   RBC 02/28/2022 4.26  4.22 - 5.81 MIL/uL Final   Hemoglobin 02/28/2022 13.2  13.0 - 17.0 g/dL Final   HCT 02/28/2022 38.7 (L)  39.0 - 52.0 % Final   MCV 02/28/2022 90.8  80.0 - 100.0 fL Final   MCH 02/28/2022 31.0  26.0 - 34.0 pg Final   MCHC 02/28/2022 34.1  30.0 - 36.0 g/dL Final   RDW 02/28/2022 13.0  11.5 - 15.5 % Final   Platelets 02/28/2022 316  150 - 400 K/uL Final   nRBC 02/28/2022 0.0  0.0 - 0.2 % Final   Neutrophils Relative % 02/28/2022 72  % Final   Neutro Abs 02/28/2022 7.2  1.7 - 7.7 K/uL Final   Lymphocytes Relative 02/28/2022 20  % Final   Lymphs Abs  02/28/2022 2.0  0.7 - 4.0 K/uL Final   Monocytes Relative 02/28/2022 5  % Final   Monocytes Absolute 02/28/2022 0.5  0.1 - 1.0 K/uL Final   Eosinophils Relative 02/28/2022 3  % Final   Eosinophils Absolute 02/28/2022 0.3  0.0 - 0.5 K/uL Final   Basophils Relative 02/28/2022 0  % Final   Basophils Absolute 02/28/2022 0.0  0.0 - 0.1 K/uL Final   Immature Granulocytes 02/28/2022 0  % Final   Abs Immature Granulocytes 02/28/2022 0.02  0.00 - 0.07 K/uL Final   Performed at [facility], [place] [address] [place], [place] [address]   Lipase 02/28/2022 40  11 -  51 U/L Final   Performed at [facility], [place] [address], [place], [place] [address]   Sodium 03/01/2022 141  135 - 145 mmol/L Final   Potassium 03/01/2022 3.2 (L)  3.5 - 5.1 mmol/L Final   Chloride 03/01/2022 110  98 - 111 mmol/L Final   CO2 03/01/2022 22  22 - 32 mmol/L Final   Glucose, Bld 03/01/2022 92  70 - 99 mg/dL Final   Glucose reference range applies only to samples taken after fasting for at least 8 hours.   BUN 03/01/2022 13  8 - 23 mg/dL Final   Creatinine, Ser 03/01/2022 0.73  0.61 - 1.24 mg/dL Final   Calcium 03/01/2022 8.5 (L)  8.9 - 10.3 mg/dL Final   Total Protein 03/01/2022 6.4 (L)  6.5 - 8.1 g/dL Final   Albumin 03/01/2022 3.2 (L)  3.5 - 5.0 g/dL Final   AST 03/01/2022 19  15 - 41 U/L Final   ALT 03/01/2022 29  0 - 44 U/L Final   Alkaline Phosphatase 03/01/2022 89  38 - 126 U/L Final   Total Bilirubin 03/01/2022 0.7  0.3 - 1.2 mg/dL Final   GFR, Estimated 03/01/2022 >60  >60 mL/min Final   Comment: (NOTE) Calculated using the CKD-EPI Creatinine Equation (2021)    Anion gap 03/01/2022 9  5 - 15 Final   Performed at [facility], [place] [address], [place], [place] [address]   WBC 03/01/2022 12.5 (H)  4.0 - 10.5 K/uL Final   RBC 03/01/2022 3.90 (L)  4.22 - 5.81 MIL/uL Final   Hemoglobin 03/01/2022 12.1 (L)  13.0 - 17.0 g/dL Final   HCT 03/01/2022 36.4 (L)  39.0  - 52.0 % Final   MCV 03/01/2022 93.3  80.0 - 100.0 fL Final   MCH 03/01/2022 31.0  26.0 - 34.0 pg Final   MCHC 03/01/2022 33.2  30.0 - 36.0 g/dL Final   RDW 03/01/2022 13.1  11.5 - 15.5 % Final   Platelets 03/01/2022 272  150 - 400 K/uL Final   nRBC 03/01/2022 0.0  0.0 - 0.2 % Final   Performed at [facility], [place] [address], [place], [place] [address]   WBC 03/02/2022 10.1  4.0 - 10.5 K/uL Final   RBC 03/02/2022 3.64 (L)  4.22 - 5.81 MIL/uL Final   Hemoglobin 03/02/2022 11.4 (L)  13.0 - 17.0 g/dL Final   HCT 03/02/2022 34.0 (L)  39.0 - 52.0 % Final   MCV 03/02/2022 93.4  80.0 - 100.0 fL Final   MCH 03/02/2022 31.3  26.0 - 34.0 pg Final   MCHC 03/02/2022 33.5  30.0 - 36.0 g/dL Final   RDW 03/02/2022 13.1  11.5 - 15.5 % Final   Platelets 03/02/2022 225  150 - 400 K/uL Final   nRBC 03/02/2022 0.0  0.0 - 0.2 % Final   Performed at [facility] [place] [address], [place], [place] [address]   Sodium 03/02/2022 140  135 - 145 mmol/L Final   Potassium 03/02/2022 3.7  3.5 - 5.1 mmol/L Final   Chloride 03/02/2022 113 (H)  98 - 111 mmol/L Final   CO2 03/02/2022 21 (L)  22 - 32 mmol/L Final   Glucose, Bld 03/02/2022 145 (H)  70 - 99 mg/dL Final   Glucose reference range applies only to samples taken after fasting for at least 8 hours.   BUN 03/02/2022 13  8 - 23 mg/dL Final   Creatinine, Ser 03/02/2022 0.82  0.61 -  1.24 mg/dL Final   Calcium 03/02/2022 8.4 (L)  8.9 - 10.3 mg/dL Final   GFR, Estimated 03/02/2022 >60  >60 mL/min Final   Comment: (NOTE) Calculated using the CKD-EPI Creatinine Equation (2021)    Anion gap 03/02/2022 6  5 - 15 Final   Performed at [facility], [place] [address], [place], [place] [address]  Admission on 02/16/2022, Discharged on 02/18/2022  Component Date Value Ref Range Status   WBC 02/16/2022 9.6  4.0 - 10.5 K/uL Final   RBC 02/16/2022 4.67  4.22 - 5.81 MIL/uL Final   Hemoglobin 02/16/2022 14.7  13.0 - 17.0  g/dL Final   HCT 02/16/2022 42.4  39.0 - 52.0 % Final   MCV 02/16/2022 90.8  80.0 - 100.0 fL Final   MCH 02/16/2022 31.5  26.0 - 34.0 pg Final   MCHC 02/16/2022 34.7  30.0 - 36.0 g/dL Final   RDW 02/16/2022 13.2  11.5 - 15.5 % Final   Platelets 02/16/2022 275  150 - 400 K/uL Final   nRBC 02/16/2022 0.0  0.0 - 0.2 % Final   Neutrophils Relative % 02/16/2022 76  % Final   Neutro Abs 02/16/2022 7.3  1.7 - 7.7 K/uL Final   Lymphocytes Relative 02/16/2022 18  % Final   Lymphs Abs 02/16/2022 1.7  0.7 - 4.0 K/uL Final   Monocytes Relative 02/16/2022 5  % Final   Monocytes Absolute 02/16/2022 0.4  0.1 - 1.0 K/uL Final   Eosinophils Relative 02/16/2022 1  % Final   Eosinophils Absolute 02/16/2022 0.1  0.0 - 0.5 K/uL Final   Basophils Relative 02/16/2022 0  % Final   Basophils Absolute 02/16/2022 0.0  0.0 - 0.1 K/uL Final   Immature Granulocytes 02/16/2022 0  % Final   Abs Immature Granulocytes 02/16/2022 0.03  0.00 - 0.07 K/uL Final   Performed at [facility], [place] [address], [place], [place] [address]   Sodium 02/16/2022 134 (L)  135 - 145 mmol/L Final   Potassium 02/16/2022 3.8  3.5 - 5.1 mmol/L Final   Chloride 02/16/2022 100  98 - 111 mmol/L Final   CO2 02/16/2022 25  22 - 32 mmol/L Final   Glucose, Bld 02/16/2022 108 (H)  70 - 99 mg/dL Final   Glucose reference range applies only to samples taken after fasting for at least 8 hours.   BUN 02/16/2022 12  8 - 23 mg/dL Final   Creatinine, Ser 02/16/2022 0.91  0.61 - 1.24 mg/dL Final   Calcium 02/16/2022 9.4  8.9 - 10.3 mg/dL Final   Total Protein 02/16/2022 7.5  6.5 - 8.1 g/dL Final   Albumin 02/16/2022 4.0  3.5 - 5.0 g/dL Final   AST 02/16/2022 20  15 - 41 U/L Final   ALT 02/16/2022 17  0 - 44 U/L Final   Alkaline Phosphatase 02/16/2022 64  38 - 126 U/L Final   Total Bilirubin 02/16/2022 0.8  0.3 - 1.2 mg/dL Final   GFR, Estimated 02/16/2022 >60  >60 mL/min Final   Comment: (NOTE) Calculated using the CKD-EPI Creatinine Equation  (2021)    Anion gap 02/16/2022 9  5 - 15 Final   Performed at [place] [address] [place], [place] [address]   Troponin I (High Sensitivity) 02/16/2022 20 (H)  <18 ng/L Final   Comment: (NOTE) Elevated high sensitivity troponin I (hsTnI) values and significant  changes across serial measurements may suggest ACS but many other  chronic and acute conditions  are known to elevate hsTnI results.  Refer to the "Links" section for chest pain algorithms and additional  guidance. Performed at [facility], [place] [address], [place], [place] [address]    Lipase 02/16/2022 31  11 - 51 U/L Final   Performed at [place] [address], [place], [place] [address]   Troponin I (High Sensitivity) 02/17/2022 29 (H)  <18 ng/L Final   Comment: (NOTE) Elevated high sensitivity troponin I (hsTnI) values and significant  changes across serial measurements may suggest ACS but many other  chronic and acute conditions are known to elevate hsTnI results.  Refer to the "Links" section for chest pain algorithms and additional  guidance. Performed at [facility], [place] [address] [place] [address] [place], [place] [address]    pH, Arterial 02/17/2022 7.499 (H)  7.35 - 7.45 Final   pCO2 arterial 02/17/2022 26.7 (L)  32 - 48 mmHg Final   pO2, Arterial 02/17/2022 105  83 - 108 mmHg Final   Bicarbonate 02/17/2022 20.8  20.0 - 28.0 mmol/L Final   TCO2 02/17/2022 22  22 - 32 mmol/L Final   O2 Saturation 02/17/2022 99  % Final   Acid-base deficit 02/17/2022 1.0  0.0 - 2.0 mmol/L Final   Sodium 02/17/2022 135  135 - 145 mmol/L Final   Potassium 02/17/2022 3.6  3.5 - 5.1 mmol/L Final   Calcium, Ion 02/17/2022 1.20  1.15 - 1.40 mmol/L Final   HCT 02/17/2022 41.0  39.0 - 52.0 % Final   Hemoglobin 02/17/2022 13.9  13.0 - 17.0 g/dL Final   Patient temperature 02/17/2022 98.3 F   Final   Collection site 02/17/2022 RADIAL, ALLEN'S TEST ACCEPTABLE   Final   Drawn by 02/17/2022 Operator   Final    Sample type 02/17/2022 ARTERIAL   Final   SURGICAL PATHOLOGY 02/17/2022    Final-Edited                   Value:SURGICAL PATHOLOGY CASE: [id number] PATIENT: Almon Hercules Surgical Pathology Report     Clinical History: symptomatic cholelithiasis (cm)     FINAL MICROSCOPIC DIAGNOSIS:  A. GALLBLADDER, CHOLECYSTECTOMY: Chronic cholecystitis. Cholesterolosis. Cholelithiasis.  GROSS DESCRIPTION:  Size/?Intact: Received fresh is an 11.2 x 5.5 x 1.3 cm disrupted gallbladder Serosal surface: Tan, green-tinged, and moderately roughened Mucosa/Wall: Green and velvety without distinct lesions and a wall thickness of 0.1 cm Contents: Green viscous bile and a single black, round calculus measuring 1.0 cm Cystic duct: Probed and found to be patent Block Summary: Representative wall and the cystic duct margin are submitted in 1 block. Amparo Bristol, 02/17/2022)     Final Diagnosis performed by Verdie Mosher, MD.   Electronically signed 02/18/2022 Technical component performed at [employer]. [facility], [place] [address], [place], [place] [address].  Professional component perfo                         rmed at [facility], [place] [address] [place], [place] [address].  Immunohistochemistry Technical component (if applicable) was performed at [facility]. [address], [place], [place], [place] [address].   IMMUNOHISTOCHEMISTRY DISCLAIMER (if applicable): Some of these immunohistochemical stains may have been developed and the performance characteristics determine by [facility]. Some may not have been cleared or approved by the U.S. Food and Drug Administration. The FDA has determined that such clearance or approval is not necessary. This test is used for clinical  purposes. It should not be regarded as investigational or for research. This laboratory is certified under the [place] (CLIA-88)  as qualified to perform high complexity clinical laboratory testing.  The controls stained appropriately.   Admission on 02/14/2022, Discharged on 02/14/2022  Component Date Value Ref Range Status   Sodium 02/14/2022 139  135 - 145 mmol/L Final   Potassium 02/14/2022 4.3  3.5 - 5.1 mmol/L Final   Chloride 02/14/2022 106  98 - 111 mmol/L Final   CO2 02/14/2022 23  22 - 32 mmol/L Final   Glucose, Bld 02/14/2022 94  70 - 99 mg/dL Final   Glucose reference range applies only to samples taken after fasting for at least 8 hours.   BUN 02/14/2022 11  8 - 23 mg/dL Final   Creatinine, Ser 02/14/2022 0.94  0.61 - 1.24 mg/dL Final   Calcium 02/14/2022 9.2  8.9 - 10.3 mg/dL Final   Total Protein 02/14/2022 7.1  6.5 - 8.1 g/dL Final   Albumin 02/14/2022 3.9  3.5 - 5.0 g/dL Final   AST 02/14/2022 21  15 - 41 U/L Final   ALT 02/14/2022 14  0 - 44 U/L Final   Alkaline Phosphatase 02/14/2022 64  38 - 126 U/L Final   Total Bilirubin 02/14/2022 0.5  0.3 - 1.2 mg/dL Final   GFR, Estimated 02/14/2022 >60  >60 mL/min Final   Comment: (NOTE) Calculated using the CKD-EPI Creatinine Equation (2021)    Anion gap 02/14/2022 10  5 - 15 Final   Performed at [place] [address], [place], [place] [address]   Lipase 02/14/2022 32  11 - 51 U/L Final   Performed at [place] [address], [place], [place] [address]   WBC 02/14/2022 8.6  4.0 - 10.5 K/uL Final   RBC 02/14/2022 4.96  4.22 - 5.81 MIL/uL Final   Hemoglobin 02/14/2022 15.7  13.0 - 17.0 g/dL Final   HCT 02/14/2022 47.1  39.0 - 52.0 % Final   MCV 02/14/2022 95.0  80.0 - 100.0 fL Final   MCH 02/14/2022 31.7  26.0 - 34.0 pg Final   MCHC 02/14/2022 33.3  30.0 - 36.0 g/dL Final   RDW 02/14/2022 13.2  11.5 - 15.5 % Final   Platelets 02/14/2022 251  150 - 400 K/uL Final   nRBC 02/14/2022 0.0  0.0 - 0.2 % Final   Neutrophils Relative % 02/14/2022 65  % Final   Neutro Abs 02/14/2022 5.5  1.7 - 7.7 K/uL Final   Lymphocytes Relative  02/14/2022 25  % Final   Lymphs Abs 02/14/2022 2.2  0.7 - 4.0 K/uL Final   Monocytes Relative 02/14/2022 5  % Final   Monocytes Absolute 02/14/2022 0.4  0.1 - 1.0 K/uL Final   Eosinophils Relative 02/14/2022 4  % Final   Eosinophils Absolute 02/14/2022 0.4  0.0 - 0.5 K/uL Final   Basophils Relative 02/14/2022 1  % Final   Basophils Absolute 02/14/2022 0.1  0.0 - 0.1 K/uL Final   Immature Granulocytes 02/14/2022 0  % Final   Abs Immature Granulocytes 02/14/2022 0.02  0.00 - 0.07 K/uL Final   Performed at [facility], [place] [address], [place], [place] [address]  Admission on 12/16/2021, Discharged on 12/17/2021  Component Date Value Ref Range Status   Lipase 12/16/2021 38  11 - 51 U/L Final   Performed at [facility], [place] [address] [place], [place] [address]  Sodium 12/16/2021 138  135 - 145 mmol/L Final   Potassium 12/16/2021 3.4 (L)  3.5 - 5.1 mmol/L Final   Chloride 12/16/2021 104  98 - 111 mmol/L Final   CO2 12/16/2021 25  22 - 32 mmol/L Final   Glucose, Bld 12/16/2021 107 (H)  70 - 99 mg/dL Final   Glucose reference range applies only to samples taken after fasting for at least 8 hours.   BUN 12/16/2021 18  8 - 23 mg/dL Final   Creatinine, Ser 12/16/2021 0.77  0.61 - 1.24 mg/dL Final   Calcium 12/16/2021 8.8 (L)  8.9 - 10.3 mg/dL Final   Total Protein 12/16/2021 8.1  6.5 - 8.1 g/dL Final   Albumin 12/16/2021 4.3  3.5 - 5.0 g/dL Final   AST 12/16/2021 19  15 - 41 U/L Final   ALT 12/16/2021 16  0 - 44 U/L Final   Alkaline Phosphatase 12/16/2021 68  38 - 126 U/L Final   Total Bilirubin 12/16/2021 0.5  0.3 - 1.2 mg/dL Final   GFR, Estimated 12/16/2021 >60  >60 mL/min Final   Comment: (NOTE) Calculated using the CKD-EPI Creatinine Equation (2021)    Anion gap 12/16/2021 9  5 - 15 Final   Performed at [facility], [place] [address], [place], [place] [address]   WBC 12/16/2021 11.7 (H)  4.0 - 10.5 K/uL Final   RBC 12/16/2021 4.07 (L)   4.22 - 5.81 MIL/uL Final   Hemoglobin 12/16/2021 13.0  13.0 - 17.0 g/dL Final   HCT 12/16/2021 37.6 (L)  39.0 - 52.0 % Final   MCV 12/16/2021 92.4  80.0 - 100.0 fL Final   MCH 12/16/2021 31.9  26.0 - 34.0 pg Final   MCHC 12/16/2021 34.6  30.0 - 36.0 g/dL Final   RDW 12/16/2021 14.1  11.5 - 15.5 % Final   Platelets 12/16/2021 293  150 - 400 K/uL Final   nRBC 12/16/2021 0.0  0.0 - 0.2 % Final   Neutrophils Relative % 12/16/2021 70  % Final   Neutro Abs 12/16/2021 8.2 (H)  1.7 - 7.7 K/uL Final   Lymphocytes Relative 12/16/2021 23  % Final   Lymphs Abs 12/16/2021 2.6  0.7 - 4.0 K/uL Final   Monocytes Relative 12/16/2021 6  % Final   Monocytes Absolute 12/16/2021 0.7  0.1 - 1.0 K/uL Final   Eosinophils Relative 12/16/2021 1  % Final   Eosinophils Absolute 12/16/2021 0.1  0.0 - 0.5 K/uL Final   Basophils Relative 12/16/2021 0  % Final   Basophils Absolute 12/16/2021 0.0  0.0 - 0.1 K/uL Final   Immature Granulocytes 12/16/2021 0  % Final   Abs Immature Granulocytes 12/16/2021 0.04  0.00 - 0.07 K/uL Final   Performed at [facility], [place] [address], [place], [place] [address]   Color, Urine 12/16/2021 YELLOW  YELLOW Final   APPearance 12/16/2021 CLEAR  CLEAR Final   Specific Gravity, Urine 12/16/2021 1.015  1.005 - 1.030 Final   pH 12/16/2021 7.0  5.0 - 8.0 Final   Glucose, UA 12/16/2021 NEGATIVE  NEGATIVE mg/dL Final   Hgb urine dipstick 12/16/2021 SMALL (A)  NEGATIVE Final   Bilirubin Urine 12/16/2021 NEGATIVE  NEGATIVE Final   Ketones, ur 12/16/2021 NEGATIVE  NEGATIVE mg/dL Final   Protein, ur 12/16/2021 NEGATIVE  NEGATIVE mg/dL Final   Nitrite 12/16/2021 NEGATIVE  NEGATIVE Final   Leukocytes,Ua 12/16/2021 NEGATIVE  NEGATIVE Final   RBC / HPF 12/16/2021 6-10  0 - 5 RBC/hpf  Final   WBC, UA 12/16/2021 0-5  0 - 5 WBC/hpf Final   Bacteria, UA 12/16/2021 NONE SEEN  NONE SEEN Final   Mucus 12/16/2021 PRESENT   Final   Amorphous Crystal 12/16/2021 PRESENT   Final    Performed at [facility], [place] [address] [place], [place] [address]   Total CK 12/16/2021 99  49 - 397 U/L Final   Performed at [facility], [place] [address], [place], [place] [address]   Troponin I (High Sensitivity) 12/16/2021 23 (H)  <18 ng/L Final   Comment: (NOTE) Elevated high sensitivity troponin I (hsTnI) values and significant  changes across serial measurements may suggest ACS but many other  chronic and acute conditions are known to elevate hsTnI results.  Refer to the "Links" section for chest pain algorithms and additional  guidance. Performed at [facility], [place] [address], [place], [place] [address]    Troponin I (High Sensitivity) 12/17/2021 48 (H)  <18 ng/L Final   Comment: DELTA CHECK NOTED (NOTE) Elevated high sensitivity troponin I (hsTnI) values and significant  changes across serial measurements may suggest ACS but many other  chronic and acute conditions are known to elevate hsTnI results.  Refer to the Links section for chest pain algorithms and additional  guidance. Performed at [facility], [place] [address], [place], [place] [address]    HIV Screen 4th Generation wRfx 12/17/2021 Non Reactive  Non Reactive Final   Performed at [facility], [place] [address] [place], [place] [address]   Lipoprotein (a) 12/17/2021 27.0  <75.0 nmol/L Final   Comment: (NOTE) Note:  Values greater than or equal to 75.0 nmol/L may       indicate an independent risk factor for CHD,       but must be evaluated with caution when applied       to non-Caucasian populations due to the       influence of genetic factors on Lp(a) across       ethnicities. Performed At: [facility] [place], [place] [id number] Rush Farmer MD [id number]    Opiates 12/16/2021 NONE DETECTED  NONE DETECTED Final   Cocaine 12/16/2021 NONE DETECTED  NONE DETECTED Final   Benzodiazepines 12/16/2021  NONE DETECTED  NONE DETECTED Final   Amphetamines 12/16/2021 NONE DETECTED  NONE DETECTED Final   Tetrahydrocannabinol 12/16/2021 NONE DETECTED  NONE DETECTED Final   Barbiturates 12/16/2021 NONE DETECTED  NONE DETECTED Final   Comment: (NOTE) DRUG SCREEN FOR MEDICAL PURPOSES ONLY.  IF CONFIRMATION IS NEEDED FOR ANY PURPOSE, NOTIFY LAB WITHIN 5 DAYS.  LOWEST DETECTABLE LIMITS FOR URINE DRUG SCREEN Drug Class                     Cutoff (ng/mL) Amphetamine and metabolites    1000 Barbiturate and metabolites    200 Benzodiazepine                 371 Tricyclics and metabolites     300 Opiates and metabolites        300 Cocaine and metabolites        300 THC                            50 Performed at [facility], [place] [address] [place],  [address]    SARS Coronavirus 2 by RT PCR 12/17/2021 NEGATIVE  NEGATIVE Final   Comment: (NOTE) SARS-CoV-2 target nucleic acids are NOT DETECTED.  The SARS-CoV-2 RNA is generally detectable in upper and lower respiratory specimens during the acute phase of infection. The lowest concentration of SARS-CoV-2 viral copies this assay can detect is 250 copies / mL. A negative result does not preclude SARS-CoV-2 infection and should not be used as the sole basis for treatment or other patient management decisions.  A negative result may occur with improper specimen collection / handling, submission of specimen other than nasopharyngeal swab, presence of viral mutation(s) within the areas targeted by this assay, and inadequate number of viral copies (<250 copies / mL). A negative result must be combined with clinical observations, patient history, and epidemiological information.  Fact Sheet for Patients:   https://www.patel.info/  Fact Sheet for Healthcare Providers: https://hall.com/  This test is not yet approved or                           cleared by the [place] FDA  and has been authorized for detection and/or diagnosis of SARS-CoV-2 by FDA under an Emergency Use Authorization (EUA).  This EUA will remain in effect (meaning this test can be used) for the duration of the COVID-19 declaration under Section 564(b)(1) of the Act, 21 U.S.C. section 360bbb-3(b)(1), unless the authorization is terminated or revoked sooner.  Performed at [facility], [place] [address] [place], [place] [address]    Sodium 12/17/2021 137  135 - 145 mmol/L Final   Potassium 12/17/2021 3.5  3.5 - 5.1 mmol/L Final   Chloride 12/17/2021 103  98 - 111 mmol/L Final   CO2 12/17/2021 27  22 - 32 mmol/L Final   Glucose, Bld 12/17/2021 84  70 - 99 mg/dL Final   Glucose reference range applies only to samples taken after fasting for at least 8 hours.   BUN 12/17/2021 13  8 - 23 mg/dL Final   Creatinine, Ser 12/17/2021 0.85  0.61 - 1.24 mg/dL Final   Calcium 12/17/2021 8.6 (L)  8.9 - 10.3 mg/dL Final   Total Protein 12/17/2021 6.2 (L)  6.5 - 8.1 g/dL Final   Albumin 12/17/2021 3.2 (L)  3.5 - 5.0 g/dL Final   AST 12/17/2021 17  15 - 41 U/L Final   ALT 12/17/2021 15  0 - 44 U/L Final   Alkaline Phosphatase 12/17/2021 56  38 - 126 U/L Final   Total Bilirubin 12/17/2021 0.4  0.3 - 1.2 mg/dL Final   GFR, Estimated 12/17/2021 >60  >60 mL/min Final   Comment: (NOTE) Calculated using the CKD-EPI Creatinine Equation (2021)    Anion gap 12/17/2021 7  5 - 15 Final   Performed at [place] [address] [place], [place] [address]   Heparin Unfractionated 12/17/2021 <0.10 (L)  0.30 - 0.70 IU/mL Final   Comment: (NOTE) The clinical reportable range upper limit is being lowered to >1.10 to align with the FDA approved guidance for the current laboratory assay.  If heparin results are below expected values, and patient dosage has  been confirmed, suggest follow up testing of antithrombin III levels. Performed at [facility], [place] [address],  [place], [place] [address]    WBC 12/17/2021 7.4  4.0 - 10.5 K/uL Final   RBC 12/17/2021 3.72 (L)  4.22 - 5.81 MIL/uL Final   Hemoglobin 12/17/2021 11.4 (L)  13.0 - 17.0 g/dL Final   HCT 12/17/2021 35.4 (L)  39.0 - 52.0 % Final   MCV 12/17/2021 95.2  80.0 - 100.0 fL Final   MCH 12/17/2021 30.6  26.0 - 34.0 pg Final   MCHC 12/17/2021 32.2  30.0 - 36.0 g/dL Final   RDW 12/17/2021 14.3  11.5 - 15.5 % Final   Platelets 12/17/2021 241  150 - 400 K/uL Final   nRBC 12/17/2021 0.0  0.0 - 0.2 % Final   Performed at [facility], [place] [address], [place], [place] [address]    Blood Alcohol level:  Lab Results  Component Value Date   ETH <10 09/24/2020   ETH <10 28/78/6767    Metabolic Disorder Labs: Lab Results  Component Value Date   HGBA1C 5.2 04/26/2022   MPG 102.54 04/26/2022   MPG 103 12/28/2016   No results found for: "PROLACTIN" Lab Results  Component Value Date   CHOL 83 04/26/2022   TRIG 52 04/26/2022   HDL 28 (L) 04/26/2022   CHOLHDL 3.0 04/26/2022   VLDL 10 04/26/2022   LDLCALC 45 04/26/2022   LDLCALC 41 08/31/2021    Therapeutic Lab Levels: No results found for: "LITHIUM" Lab Results  Component Value Date   VALPROATE 22 (L) 04/26/2022   VALPROATE 18 (L) 10/07/2021   No results found for: "CBMZ"  Physical Findings   AIMS    Flowsheet Row Admission (Discharged) from 12/27/2016 in [place] Total Score 1      AUDIT    Flowsheet Row Admission (Discharged) from 12/27/2016 in [place]  Alcohol Use Disorder Identification Test Final Score (AUDIT) 32      GAD-7    [place] from 06/03/2019 in [place]  Total GAD-7 Score 14      PHQ2-9    [place] Office Visit from 01/03/2022 in [place] Office Visit from 08/31/2021 in [place] Visit from 06/21/2021 in [place] from 03/12/2021 in [facility] Office Visit from 01/29/2021 in [place]  PHQ-2 Total Score 2 0 '2 4 4  '$ PHQ-9 Total Score '5 1 7 16 11      '$ [place] ED from 04/26/2022 in [facility] ED to Hosp-Admission (Discharged) from 02/28/2022 in [facility] to Hosp-Admission (Discharged) from 02/16/2022 in [place] No Risk No Risk No Risk        Musculoskeletal  Strength & Muscle Tone: within normal limits Gait & Station: normal Patient leans: N/A  Psychiatric Specialty Exam  Presentation  General Appearance:  Appropriate for Environment; Fairly Groomed  Eye Contact: Fair  Speech: Clear and Coherent; Normal Rate  Speech Volume: Normal  Handedness: Right   Mood and Affect  Mood: Hopeless; Anxious  Affect: Appropriate   Thought Process  Thought Processes: Coherent  Descriptions of Associations:Intact  Orientation:Full (Time, Place and Person)  Thought Content:Logical  Diagnosis of Schizophrenia or Schizoaffective disorder in past: No data recorded   Hallucinations:Hallucinations: None  Ideas of Reference:None  Suicidal Thoughts:Suicidal Thoughts: No  Homicidal Thoughts:Homicidal Thoughts: No   Sensorium  Memory: Immediate Fair; Recent Fair  Judgment: Fair  Insight: Fair   Community education officer  Concentration: Fair  Attention Span: Fair  Recall: [employer] of Knowledge: Fair  Language: Fair   Psychomotor Activity  Psychomotor Activity: Psychomotor Activity: Tremor   Assets  Assets: Armed forces logistics/support/administrative officer; Desire for Improvement; Social Support   Sleep  Sleep: Sleep: Fair   No data recorded  Physical Exam  Physical Exam Vitals and nursing note reviewed.  HENT:     Mouth/Throat:     Mouth: Mucous membranes are moist.  Cardiovascular:     Rate and Rhythm:  Normal rate and regular rhythm.  Pulmonary:     Effort: Pulmonary effort is normal.  Neurological:     General: No focal deficit present.     Mental Status: He is alert and oriented to person, place, and time.  Psychiatric:        Mood and Affect: Mood normal.        Behavior: Behavior normal.        Thought Content: Thought content normal.    Review of Systems  Eyes: Negative.   Psychiatric/Behavioral:  Positive for depression and substance abuse. The patient is nervous/anxious.   All other systems reviewed and are negative.  Blood pressure 113/62, pulse (!) 51, temperature 98 F (36.7 C), temperature source Oral, resp. rate 18, SpO2 99 %. There is no height or weight on file to calculate BMI.  Treatment Plan Summary: Daily contact with patient to assess and evaluate symptoms and progress in treatment and Medication management  -Anticipated transfer to [facility]) once a bed becomes available.  States he has plans to follow-up for residential treatment at [facility].  -Continue home medications  -NP spoke to [facility] regarding seeking residential treatment facility as patient is currently not on a scheduled detox protocol.   COWS and CIWA to be reported every 6 hours  Derrill Center, NP 04/27/2022 12:19 PM

## 2022-04-27 NOTE — ED Notes (Signed)
Sleeping respirations easy no distress noted

## 2022-04-27 NOTE — ED Notes (Signed)
Patient watching TV with peers.  Respirations equal and unlabored, skin warm and dry, NAD. No change in assessment or acuity. Routine safety checks conducted according to facility protocol. Will continue to monitor for safety.

## 2022-04-27 NOTE — ED Notes (Signed)
Patient A&Ox4. Patient denies SI/HI and AVH. Patient denies any physical complaints when asked. No acute distress noted. Support and encouragement provided. Routine safety checks conducted according to facility protocol. Encouraged patient to notify staff if thoughts of harm toward self or others arise. Patient verbalize understanding and agreement. Will continue to monitor for safety.    

## 2022-04-27 NOTE — ED Notes (Signed)
Pt A&O x 4, no distress noted, resting at present.  Watching TV at present.  Monitoring for safety.

## 2022-04-27 NOTE — ED Notes (Signed)
Pt sleeping at present, no distress noted.  Monitoring for safety. 

## 2022-04-27 NOTE — ED Notes (Signed)
Patient resting quietly in bed with eyes closed. Respirations equal and unlabored, skin warm and dry, NAD. No change in assessment or acuity. Routine safety checks conducted according to facility protocol. Will continue to monitor for safety.   

## 2022-04-27 NOTE — ED Notes (Signed)
Patient resting quietly in bed watching TV. Respirations equal and unlabored, skin warm and dry, NAD. No change in assessment or acuity. Routine safety checks conducted according to facility protocol. Will continue to monitor for safety.

## 2022-04-28 ENCOUNTER — Encounter (HOSPITAL_COMMUNITY): Payer: Self-pay

## 2022-04-28 DIAGNOSIS — F1410 Cocaine abuse, uncomplicated: Secondary | ICD-10-CM | POA: Diagnosis not present

## 2022-04-28 DIAGNOSIS — F4310 Post-traumatic stress disorder, unspecified: Secondary | ICD-10-CM | POA: Diagnosis not present

## 2022-04-28 DIAGNOSIS — F1910 Other psychoactive substance abuse, uncomplicated: Secondary | ICD-10-CM | POA: Diagnosis present

## 2022-04-28 DIAGNOSIS — Z1152 Encounter for screening for COVID-19: Secondary | ICD-10-CM | POA: Diagnosis not present

## 2022-04-28 DIAGNOSIS — F250 Schizoaffective disorder, bipolar type: Secondary | ICD-10-CM | POA: Diagnosis not present

## 2022-04-28 MED ORDER — OXYMETAZOLINE HCL 0.05 % NA SOLN
1.0000 | Freq: Two times a day (BID) | NASAL | Status: AC | PRN
  Administered 2022-04-29: 1 via NASAL
  Filled 2022-04-28: qty 30

## 2022-04-28 NOTE — ED Notes (Signed)
Patient was admitted to [facility] from [facility]. Patient denies SI/HI and AVH. Patient received his medications and has been calm and cooperative while being on the unit. Patient will be monitored for safety.

## 2022-04-28 NOTE — ED Notes (Signed)
Pt transferred to [facility] for continuum of care per Maudie Mercury, NP. Safety maintained.

## 2022-04-28 NOTE — ED Provider Notes (Signed)
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 viscous bile and a single black, round calculus measuring 1.0 cm Cystic duct: Probed and found to be patent Block Summary: Representative wall and the cystic duct margin are submitted in 1 block. Amparo Bristol, 02/17/2022)     Final Diagnosis performed by Verdie Mosher, MD.   Electronically signed 02/18/2022 Technical component performed at [employer]. [facility] [place] [address] [place], [place] [address].  Professional component perfo                         rmed at [facility], [place] [address], [place], [place] [address].  Immunohistochemistry Technical component (if applicable) was performed at [facility]. [address], [place], [place], [place] [address].   IMMUNOHISTOCHEMISTRY DISCLAIMER (if applicable): Some of these immunohistochemical stains may have been developed and the performance characteristics determine by [facility]. Some may not have been cleared or approved by the U.S. Food and Drug Administration. The FDA has determined that such clearance or approval is not necessary. This test is used for clinical purposes. It should not be regarded as investigational or for research. This laboratory is certified under the [place] (CLIA-88) as qualified to perform high complexity clinical laboratory testing.  The controls stained appropriately.   Admission on 02/14/2022, Discharged on 02/14/2022  Component Date Value Ref Range Status   Sodium 02/14/2022 139  135 - 145 mmol/L Final   Potassium 02/14/2022 4.3  3.5 - 5.1 mmol/L Final   Chloride 02/14/2022 106  98 - 111 mmol/L Final   CO2 02/14/2022 23  22 - 32 mmol/L Final   Glucose, Bld 02/14/2022 94  70 - 99 mg/dL Final   BUN 02/14/2022 11  8 - 23 mg/dL Final   Creatinine, Ser 02/14/2022 0.94  0.61 - 1.24 mg/dL Final   Calcium 02/14/2022 9.2  8.9 - 10.3 mg/dL Final   Total Protein 02/14/2022 7.1  6.5 - 8.1 g/dL Final   Albumin 02/14/2022 3.9  3.5 - 5.0 g/dL Final   AST 02/14/2022 21  15 - 41 U/L Final   ALT 02/14/2022 14  0 - 44 U/L Final   Alkaline Phosphatase 02/14/2022 64  38 - 126 U/L Final   Total Bilirubin 02/14/2022 0.5  0.3 - 1.2 mg/dL Final   GFR, Estimated 02/14/2022 >60  >60 mL/min Final   Anion gap 02/14/2022 10  5 - 15 Final   Lipase 02/14/2022 32  11 - 51 U/L Final   WBC 02/14/2022 8.6  4.0 - 10.5 K/uL Final   RBC 02/14/2022 4.96  4.22 - 5.81 MIL/uL Final   Hemoglobin 02/14/2022 15.7  13.0 - 17.0 g/dL Final   HCT 02/14/2022  47.1  39.0 - 52.0 % Final   MCV 02/14/2022 95.0  80.0 - 100.0 fL Final   MCH 02/14/2022 31.7  26.0 - 34.0 pg Final   MCHC 02/14/2022 33.3  30.0  - 36.0 g/dL Final   RDW 02/14/2022 13.2  11.5 - 15.5 % Final   Platelets 02/14/2022 251  150 - 400 K/uL Final   nRBC 02/14/2022 0.0  0.0 - 0.2 % Final   Neutrophils Relative % 02/14/2022 65  % Final   Neutro Abs 02/14/2022 5.5  1.7 - 7.7 K/uL Final   Lymphocytes Relative 02/14/2022 25  % Final   Lymphs Abs 02/14/2022 2.2  0.7 - 4.0 K/uL Final   Monocytes Relative 02/14/2022 5  % Final   Monocytes Absolute 02/14/2022 0.4  0.1 - 1.0 K/uL Final   Eosinophils Relative 02/14/2022 4  % Final   Eosinophils Absolute 02/14/2022 0.4  0.0 - 0.5 K/uL Final   Basophils Relative 02/14/2022 1  % Final   Basophils Absolute 02/14/2022 0.1  0.0 - 0.1 K/uL Final   Immature Granulocytes 02/14/2022 0  % Final   Abs Immature Granulocytes 02/14/2022 0.02  0.00 - 0.07 K/uL Final  Admission on 12/16/2021, Discharged on 12/17/2021  Component Date Value Ref Range Status   Lipase 12/16/2021 38  11 - 51 U/L Final   Sodium 12/16/2021 138  135 - 145 mmol/L Final   Potassium 12/16/2021 3.4 (L)  3.5 - 5.1 mmol/L Final   Chloride 12/16/2021 104  98 - 111 mmol/L Final   CO2 12/16/2021 25  22 - 32 mmol/L Final   Glucose, Bld 12/16/2021 107 (H)  70 - 99 mg/dL Final   BUN 12/16/2021 18  8 - 23 mg/dL Final   Creatinine, Ser 12/16/2021 0.77  0.61 - 1.24 mg/dL Final   Calcium 12/16/2021 8.8 (L)  8.9 - 10.3 mg/dL Final   Total Protein 12/16/2021 8.1  6.5 - 8.1 g/dL Final   Albumin 12/16/2021 4.3  3.5 - 5.0 g/dL Final   AST 12/16/2021 19  15 - 41 U/L Final   ALT 12/16/2021 16  0 - 44 U/L Final   Alkaline Phosphatase 12/16/2021 68  38 - 126 U/L Final   Total Bilirubin 12/16/2021 0.5  0.3 - 1.2 mg/dL Final   GFR, Estimated 12/16/2021 >60  >60 mL/min Final   Anion gap 12/16/2021 9  5 - 15 Final   WBC 12/16/2021 11.7 (H)  4.0 - 10.5 K/uL Final   RBC 12/16/2021 4.07 (L)  4.22 - 5.81 MIL/uL Final   Hemoglobin 12/16/2021 13.0  13.0 - 17.0 g/dL Final   HCT 12/16/2021 37.6 (L)  39.0 - 52.0 % Final   MCV 12/16/2021 92.4  80.0 -  100.0 fL Final   MCH 12/16/2021 31.9  26.0 - 34.0 pg Final   MCHC 12/16/2021 34.6  30.0 - 36.0 g/dL Final   RDW 12/16/2021 14.1  11.5 - 15.5 % Final   Platelets 12/16/2021 293  150 - 400 K/uL Final   nRBC 12/16/2021 0.0  0.0 - 0.2 % Final   Neutrophils Relative % 12/16/2021 70  % Final   Neutro Abs 12/16/2021 8.2 (H)  1.7 - 7.7 K/uL Final   Lymphocytes Relative 12/16/2021 23  % Final   Lymphs Abs 12/16/2021 2.6  0.7 - 4.0 K/uL Final   Monocytes Relative 12/16/2021 6  % Final   Monocytes Absolute 12/16/2021 0.7  0.1 - 1.0 K/uL Final   Eosinophils Relative 12/16/2021 1  % Final  Eosinophils Absolute 12/16/2021 0.1  0.0 - 0.5 K/uL Final   Basophils Relative 12/16/2021 0  % Final   Basophils Absolute 12/16/2021 0.0  0.0 - 0.1 K/uL Final   Immature Granulocytes 12/16/2021 0  % Final   Abs Immature Granulocytes 12/16/2021 0.04  0.00 - 0.07 K/uL Final   Color, Urine 12/16/2021 YELLOW  YELLOW Final   APPearance 12/16/2021 CLEAR  CLEAR Final   Specific Gravity, Urine 12/16/2021 1.015  1.005 - 1.030 Final   pH 12/16/2021 7.0  5.0 - 8.0 Final   Glucose, UA 12/16/2021 NEGATIVE  NEGATIVE mg/dL Final   Hgb urine dipstick 12/16/2021 SMALL (A)  NEGATIVE Final   Bilirubin Urine 12/16/2021 NEGATIVE  NEGATIVE Final   Ketones, ur 12/16/2021 NEGATIVE  NEGATIVE mg/dL Final   Protein, ur 12/16/2021 NEGATIVE  NEGATIVE mg/dL Final   Nitrite 12/16/2021 NEGATIVE  NEGATIVE Final   Leukocytes,Ua 12/16/2021 NEGATIVE  NEGATIVE Final   RBC / HPF 12/16/2021 6-10  0 - 5 RBC/hpf Final   WBC, UA 12/16/2021 0-5  0 - 5 WBC/hpf Final   Bacteria, UA 12/16/2021 NONE SEEN  NONE SEEN Final   Mucus 12/16/2021 PRESENT   Final   Amorphous Crystal 12/16/2021 PRESENT   Final   Total CK 12/16/2021 99  49 - 397 U/L Final   Troponin I (High Sensitivity) 12/16/2021 23 (H)  <18 ng/L Final   Troponin I (High Sensitivity) 12/17/2021 48 (H)  <18 ng/L Final   HIV Screen 4th Generation wRfx 12/17/2021 Non Reactive  Non Reactive Final    Lipoprotein (a) 12/17/2021 27.0  <75.0 nmol/L Final   Opiates 12/16/2021 NONE DETECTED  NONE DETECTED Final   Cocaine 12/16/2021 NONE DETECTED  NONE DETECTED Final   Benzodiazepines 12/16/2021 NONE DETECTED  NONE DETECTED Final   Amphetamines 12/16/2021 NONE DETECTED  NONE DETECTED Final   Tetrahydrocannabinol 12/16/2021 NONE DETECTED  NONE DETECTED Final   Barbiturates 12/16/2021 NONE DETECTED  NONE DETECTED Final   SARS Coronavirus 2 by RT PCR 12/17/2021 NEGATIVE  NEGATIVE Final   Sodium 12/17/2021 137  135 - 145 mmol/L Final   Potassium 12/17/2021 3.5  3.5 - 5.1 mmol/L Final   Chloride 12/17/2021 103  98 - 111 mmol/L Final   CO2 12/17/2021 27  22 - 32 mmol/L Final   Glucose, Bld 12/17/2021 84  70 - 99 mg/dL Final   BUN 12/17/2021 13  8 - 23 mg/dL Final   Creatinine, Ser 12/17/2021 0.85  0.61 - 1.24 mg/dL Final   Calcium 12/17/2021 8.6 (L)  8.9 - 10.3 mg/dL Final   Total Protein 12/17/2021 6.2 (L)  6.5 - 8.1 g/dL Final   Albumin 12/17/2021 3.2 (L)  3.5 - 5.0 g/dL Final   AST 12/17/2021 17  15 - 41 U/L Final   ALT 12/17/2021 15  0 - 44 U/L Final   Alkaline Phosphatase 12/17/2021 56  38 - 126 U/L Final   Total Bilirubin 12/17/2021 0.4  0.3 - 1.2 mg/dL Final   GFR, Estimated 12/17/2021 >60  >60 mL/min Final   Anion gap 12/17/2021 7  5 - 15 Final   Heparin Unfractionated 12/17/2021 <0.10 (L)  0.30 - 0.70 IU/mL Final   WBC 12/17/2021 7.4  4.0 - 10.5 K/uL Final   RBC 12/17/2021 3.72 (L)  4.22 - 5.81 MIL/uL Final   Hemoglobin 12/17/2021 11.4 (L)  13.0 - 17.0 g/dL Final   HCT 12/17/2021 35.4 (L)  39.0 - 52.0 % Final   MCV 12/17/2021 95.2  80.0 - 100.0 fL  Final   MCH 12/17/2021 30.6  26.0 - 34.0 pg Final   MCHC 12/17/2021 32.2  30.0 - 36.0 g/dL Final   RDW 12/17/2021 14.3  11.5 - 15.5 % Final   Platelets 12/17/2021 241  150 - 400 K/uL Final   nRBC 12/17/2021 0.0  0.0 - 0.2 % Final   Allergies: Latuda [lurasidone hcl] and Saphris [asenapine]  PTA Medications: (Not in a hospital  admission)  Long Term Goals: Improvement in symptoms so as ready for discharge Short Term Goals: Patient will verbalize feelings in meetings with treatment team members., Patient will attend at least of 50% of the groups daily., Pt will complete the PHQ9 on admission, day 3 and discharge., Patient will participate in completing the [place], Patient will score a low risk of violence for 24 hours prior to discharge, and Patient will take medications as prescribed daily.  Medical Decision Making  Based on my evaluation, the patient does not appear to have an emergency medical condition.  Continue home medications as ordered.   Dispo: Patient requesting placement at [facility] residential treatment.  LCSW to help arrange.  May continue to engage patient in therapy regarding anger, which the patient identifies as his main psychiatric symptoms aside from substance use.   Total Time spent with patient: 30 minutes Recommendations  Based on my evaluation the patient does not appear to have an emergency medical condition.  Lab Orders         SARS Coronavirus 2 by RT PCR (hospital order, performed in [facility] hospital lab) *cepheid single result test* Anterior Nasal Swab         CBC with Differential/Platelet         Comprehensive metabolic panel         Hemoglobin A1c         Magnesium         Lipid panel         TSH         Valproic acid level         Ferritin (Iron Binding Protein)         POC SARS Coronavirus 2 Ag-ED - Nasal Swab         POCT Urine Drug Screen - (I-Screen)         POC SARS Coronavirus 2 Ag        Signed: Corky Sox, MD Psychiatry Resident, PGY-2 [facility] 04/28/2022, 5:02 PM

## 2022-04-28 NOTE — ED Notes (Signed)
Pt is in the dining room watching TV. Respirations are even and unlabored. No acute distress noted. Will continue to monitor for safety.

## 2022-04-28 NOTE — ED Notes (Signed)
Patient A&Ox4. Denies intent to harm self/others when asked. Denies A/VH. Pt stated, "I'm feeling alright for the moment but I have an intuition that something is about to happen. I think, maybe my anxiety but we'll see".  Support and encouragement provided. Informed pt to notify staff if sx present itself and in the mean time, to try deep breathing exercises, work on a puzzle or some other distraction technique to help with anxiety. Pt request his suboxone. Writer informed pt that the medication is scheduled for 1000  but the earliest I can give the medication is at 0900. Pt verbalized understanding and agreement. Bkft eaten. Routine safety checks conducted according to facility protocol. Encouraged patient to notify staff with any needs or concerns. Patient verbalize understanding and agreement. Will continue to monitor for safety.

## 2022-04-28 NOTE — ED Notes (Signed)
Pt sleeping at present, no distress noted.  Monitoring for safety. 

## 2022-04-29 DIAGNOSIS — F1910 Other psychoactive substance abuse, uncomplicated: Secondary | ICD-10-CM | POA: Diagnosis not present

## 2022-04-29 DIAGNOSIS — F4310 Post-traumatic stress disorder, unspecified: Secondary | ICD-10-CM | POA: Diagnosis not present

## 2022-04-29 DIAGNOSIS — F250 Schizoaffective disorder, bipolar type: Secondary | ICD-10-CM | POA: Diagnosis not present

## 2022-04-29 DIAGNOSIS — F1410 Cocaine abuse, uncomplicated: Secondary | ICD-10-CM | POA: Diagnosis not present

## 2022-04-29 DIAGNOSIS — Z1152 Encounter for screening for COVID-19: Secondary | ICD-10-CM | POA: Diagnosis not present

## 2022-04-29 MED ORDER — ONDANSETRON 4 MG PO TBDP: 4.0000 mg | ORAL_TABLET | Freq: Four times a day (QID) | ORAL | Status: DC | PRN

## 2022-04-29 MED ORDER — DICYCLOMINE HCL 20 MG PO TABS: 20.0000 mg | ORAL_TABLET | Freq: Four times a day (QID) | ORAL | Status: DC | PRN

## 2022-04-29 MED ORDER — CLONIDINE HCL 0.1 MG PO TABS: 0.1000 mg | ORAL_TABLET | Freq: Every day | ORAL | Status: DC

## 2022-04-29 MED ORDER — CLONIDINE HCL 0.1 MG PO TABS: 0.1000 mg | ORAL_TABLET | ORAL | Status: DC

## 2022-04-29 MED ORDER — CLONIDINE HCL 0.1 MG PO TABS: 0.1000 mg | ORAL_TABLET | Freq: Four times a day (QID) | ORAL | Status: DC

## 2022-04-29 MED ORDER — NAPROXEN 500 MG PO TABS: 500.0000 mg | ORAL_TABLET | Freq: Two times a day (BID) | ORAL | Status: DC | PRN

## 2022-04-29 MED ORDER — HYDROXYZINE HCL 25 MG PO TABS: 25.0000 mg | ORAL_TABLET | Freq: Four times a day (QID) | ORAL | Status: DC | PRN

## 2022-04-29 MED ORDER — BUPRENORPHINE HCL-NALOXONE HCL 2-0.5 MG SL SUBL
2.0000 | SUBLINGUAL_TABLET | Freq: Once | SUBLINGUAL | Status: AC
  Administered 2022-04-30: 2 via SUBLINGUAL
  Filled 2022-04-29: qty 1

## 2022-04-29 MED ORDER — LOPERAMIDE HCL 2 MG PO CAPS: 2.0000 mg | ORAL_CAPSULE | ORAL | Status: DC | PRN

## 2022-04-29 MED ORDER — CALCIUM CARBONATE ANTACID 500 MG PO CHEW
1.0000 | CHEWABLE_TABLET | Freq: Two times a day (BID) | ORAL | Status: DC
  Administered 2022-04-29 – 2022-05-02 (×6): 200 mg via ORAL
  Filled 2022-04-29 (×6): qty 1

## 2022-04-29 MED ORDER — METHOCARBAMOL 500 MG PO TABS: 500.0000 mg | ORAL_TABLET | Freq: Three times a day (TID) | ORAL | Status: DC | PRN

## 2022-04-29 NOTE — ED Notes (Signed)
Pt is in the bed resting. Respirations are even and unlabored. No acute distress noted. Will continue to monitor for safety 

## 2022-04-29 NOTE — ED Notes (Signed)
Patient resting in no sxs of distress - will continue to monitor for safety

## 2022-04-29 NOTE — ED Notes (Addendum)
Pt is in the bed awake. Respirations are even and unlabored. No acute distress noted. Will continue to monitor for safety. 

## 2022-04-29 NOTE — ED Notes (Signed)
Patient denies SI, HI or AVH - will continue to monitor for safety

## 2022-04-29 NOTE — Progress Notes (Signed)
Pt is awake, alert and oriented X4. Pt did not voice and concerns. No distress noted. Administered scheduled meds with no issue. Pt denies current SI/HI/AVH. Pt attended group therapy. Staff will monitor for pt's safety.

## 2022-04-29 NOTE — Progress Notes (Signed)
Pt's CIWA was 5 and COWS was 4. Pt is presently watching TV. No distress noted. Staff will monitor for pt's safety.

## 2022-04-29 NOTE — Progress Notes (Signed)
Pt is presently watching TV. No distress noted or concerns voiced. Staff will monitor for pt's safety.

## 2022-04-29 NOTE — ED Provider Notes (Signed)
Behavioral Health Progress Note  Date and Time: 04/29/2022 2:40 PM Name: Keith Gibbs MRN:  [id number]  Subjective:   Keith Gibbs is a 61 y.o. male, with past medical history of schizoaffective disorder (bipolar type), PTSD, chronic pain, opioid use disorder, alcohol use disorder, cocaine use disorder, cannabis use disorder and tobacco use disorder. He has significant history of multiple suicide attempts (3) and multiple inpatient behavioral health admissions, most recent 09/2020. He presented to [facility] accompanied by his mother for treatment of his substance abuse.  He has been accepted to [facility] for Monday at 9 AM.  Patient will need to be tapered off of his Suboxone.  On interview this morning, the patient is excited to hear that he has been accepted to [facility].  He is more than willing to come off of his Suboxone.  He states that he is not taking Suboxone prior to admission.  Discussed plan taper and available as needed's if patient begins experiencing withdrawal.  Patient denies experiencing any suicidal or homicidal thoughts.  He denies experiencing any auditory or visual hallucinations.  Patient complains of chronic pain in his hands and asks what pain medications he is currently taking, this information is supplied and the patient is satisfied.  Diagnosis:  Final diagnoses:  Polysubstance abuse ([place])    Total Time spent with patient: 20 minutes  Past Psychiatric History: as above Past Medical History:  Past Medical History:  Diagnosis Date   Anginal pain ([place])    Anxiety    Arthritis    Bipolar 1 disorder (HCC)    Bursitis    CAD (coronary artery disease)    Chronic pain    COPD (chronic obstructive pulmonary disease) (HCC)    Current use of long term anticoagulation    DAPT (ASA + clopidogrel)   Depression    Diverticulitis    Dyspnea    GERD (gastroesophageal reflux disease)    Grade I diastolic dysfunction    Hepatitis C 2012   No longer has Hep C   HLD  (hyperlipidemia)    Hypertension    MI (myocardial infarction) ([place])    Polysubstance abuse (HCC)    cocaine, marijuana, ETOH   PUD (peptic ulcer disease)    S/P angioplasty with stent 06/10/2016   a.) 90% stenosis of pLAD to mLAD - 2.5 x 18 mm Xience Alpine (DES x 1) placed to pLAD   S/P PTCA (percutaneous transluminal coronary angioplasty) 12/04/2019   a.) 60% in stent restenosis of DES to pLAD; LVEF 65%.   Schizophrenia ([place])    Stroke [facility])    Valvular insufficiency    a.) Mild MR, TR, PR; mild to moderate AR on 03/05/2018 TTE    Past Surgical History:  Procedure Laterality Date   ABDOMINAL SURGERY     removed small piece of intestines due to [facility] Diverticulosis   APPENDECTOMY     BACK SURGERY     CARDIAC CATHETERIZATION Left 06/10/2016   Procedure: Left Heart Cath and Coronary Angiography;  Surgeon: Dionisio David, MD;  Location: [place] CV LAB;  Service: Cardiovascular;  Laterality: Left;   CARDIAC CATHETERIZATION N/A 06/10/2016   Procedure: Coronary Stent Intervention;  Surgeon: Yolonda Kida, MD;  Location: [place] CV LAB;  Service: Cardiovascular;  Laterality: N/A;   CHOLECYSTECTOMY N/A 02/17/2022   Procedure: LAPAROSCOPIC CHOLECYSTECTOMY;  Surgeon: Kieth Brightly Arta Bruce, MD;  Location: [place];  Service: General;  Laterality: N/A;   COLON SURGERY     COLONOSCOPY  COLONOSCOPY WITH PROPOFOL N/A 01/05/2017   Procedure: COLONOSCOPY WITH PROPOFOL;  Surgeon: Jonathon Bellows, MD;  Location: [facility] ENDOSCOPY;  Service: Endoscopy;  Laterality: N/A;   COLONOSCOPY WITH PROPOFOL N/A 02/13/2020   Procedure: COLONOSCOPY WITH PROPOFOL;  Surgeon: Virgel Manifold, MD;  Location: [facility] ENDOSCOPY;  Service: Endoscopy;  Laterality: N/A;   CORONARY ANGIOPLASTY WITH STENT PLACEMENT     ESOPHAGOGASTRODUODENOSCOPY (EGD) WITH PROPOFOL N/A 01/05/2017   Procedure: ESOPHAGOGASTRODUODENOSCOPY (EGD) WITH PROPOFOL;  Surgeon: Jonathon Bellows, MD;  Location: [facility] ENDOSCOPY;  Service:  Endoscopy;  Laterality: N/A;   ESOPHAGOGASTRODUODENOSCOPY (EGD) WITH PROPOFOL N/A 02/13/2020   Procedure: ESOPHAGOGASTRODUODENOSCOPY (EGD) WITH PROPOFOL;  Surgeon: Virgel Manifold, MD;  Location: [facility] ENDOSCOPY;  Service: Endoscopy;  Laterality: N/A;   INTRAVASCULAR PRESSURE WIRE/FFR STUDY N/A 12/04/2019   Procedure: INTRAVASCULAR PRESSURE WIRE/FFR STUDY;  Surgeon: Sherren Mocha, MD;  Location: [place] CV LAB;  Service: Cardiovascular;  Laterality: N/A;   KNEE ARTHROSCOPY WITH MEDIAL MENISECTOMY Right 09/05/2017   Procedure: KNEE ARTHROSCOPY WITH MEDIAL AND LATERAL  MENISECTOMY PARTIAL SYNOVECTOMY;  Surgeon: Hessie Knows, MD;  Location: [facility] ORS;  Service: Orthopedics;  Laterality: Right;   LEFT HEART CATH AND CORONARY ANGIOGRAPHY N/A 12/04/2019   Procedure: LEFT HEART CATH AND CORONARY ANGIOGRAPHY;  Surgeon: Sherren Mocha, MD;  Location: [place] CV LAB;  Service: Cardiovascular;  Laterality: N/A;   SHOULDER SURGERY Right 04/09/2012   SPINE SURGERY     TOTAL KNEE ARTHROPLASTY Right 01/19/2021   Procedure: TOTAL KNEE ARTHROPLASTY - Rachelle Hora to Assist;  Surgeon: Hessie Knows, MD;  Location: [facility] ORS;  Service: Orthopedics;  Laterality: Right;   Family History:  Family History  Problem Relation Age of Onset   Osteoarthritis Mother    Heart disease Mother    Hypertension Mother    Depression Mother    Heart disease Father    Early death Father    Hypertension Father    Heart attack Father    Hypertension Sister    Prostate cancer Neg Hx    Bladder Cancer Neg Hx    Kidney cancer Neg Hx    Family Psychiatric  History: per H and P Social History:  Social History   Substance and Sexual Activity  Alcohol Use Yes   Alcohol/week: 6.0 standard drinks of alcohol   Types: 6 Cans of beer per week   Comment: occassionally      Social History   Substance and Sexual Activity  Drug Use Not Currently   Types: Cocaine, Marijuana   Comment: last on saturday    Social  History   Socioeconomic History   Marital status: Widowed    Spouse name: Not on file   Number of children: Not on file   Years of education: Not on file   Highest education level: Not on file  Occupational History   Occupation: disability   Occupation: stocking at gas station    Comment: 2 days per week  Tobacco Use   Smoking status: Some Days    Packs/day: 0.50    Years: 32.00    Total pack years: 16.00    Types: Cigarettes    Last attempt to quit: 10/25/2021    Years since quitting: 0.5   Smokeless tobacco: Never   Tobacco comments:    Continues to smoke approximately 5 cigarettes per day.  Has been 2 days since las cigarette.  Has not bought any more.  Using patches and gum.  5/23 hfb  Vaping Use   Vaping Use: Never used  Substance and Sexual Activity   Alcohol use: Yes    Alcohol/week: 6.0 standard drinks of alcohol    Types: 6 Cans of beer per week    Comment: occassionally    Drug use: Not Currently    Types: Cocaine, Marijuana    Comment: last on saturday   Sexual activity: Yes    Partners: Female    Birth control/protection: None  Other Topics Concern   Not on file  Social History Narrative   Not on file   Social Determinants of Health   Financial Resource Strain: Not on file  Food Insecurity: Not on file  Transportation Needs: Not on file  Physical Activity: Not on file  Stress: Not on file  Social Connections: Not on file   SDOH:  SDOH Screenings   Alcohol Screen: Low Risk  (06/21/2021)  Depression (PHQ2-9): Medium Risk (01/03/2022)  Tobacco Use: High Risk (04/27/2022)   Additional Social History:                         Sleep: Good  Appetite:  Good  Current Medications:  Current Facility-Administered Medications  Medication Dose Route Frequency Provider Last Rate Last Admin   acetaminophen (TYLENOL) tablet 650 mg  650 mg Oral Q6H PRN Merrily Brittle, DO   650 mg at 04/28/22 1740   alum & mag hydroxide-simeth (MAALOX/MYLANTA)  200-200-20 MG/5ML suspension 30 mL  30 mL Oral Q4H PRN Merrily Brittle, DO       aspirin EC tablet 81 mg  81 mg Oral Daily Merrily Brittle, DO   81 mg at 04/29/22 0901   atorvastatin (LIPITOR) tablet 80 mg  80 mg Oral QHS Merrily Brittle, DO   80 mg at 04/28/22 2116   Budeson-Glycopyrrol-Formoterol 160-9-4.8 MCG/ACT AERO 2 puff  2 puff Mouth/Throat Q12H Merrily Brittle, DO   2 puff at 04/29/22 0908   [START ON 04/30/2022] buprenorphine-naloxone (SUBOXONE) 2-0.5 mg per SL tablet 2 tablet  2 tablet Sublingual Once Corky Sox, MD       calcium carbonate (TUMS - dosed in mg elemental calcium) chewable tablet 200 mg of elemental calcium  1 tablet Oral BID WC Corky Sox, MD       clopidogrel (PLAVIX) tablet 75 mg  75 mg Oral Daily Merrily Brittle, DO   75 mg at 04/29/22 0901   diclofenac Sodium (VOLTAREN) 1 % topical gel 2 g  2 g Topical QID Merrily Brittle, DO   2 g at 04/29/22 1333   dicyclomine (BENTYL) tablet 20 mg  20 mg Oral Q6H PRN Corky Sox, MD       divalproex (DEPAKOTE) DR tablet 250 mg  250 mg Oral BID Merrily Brittle, DO   250 mg at 04/29/22 0900   DULoxetine (CYMBALTA) DR capsule 60 mg  60 mg Oral Daily Merrily Brittle, DO   60 mg at 04/29/22 0901   enalapril (VASOTEC) tablet 2.5 mg  2.5 mg Oral BID Merrily Brittle, DO   2.5 mg at 04/29/22 0901   fluticasone (FLONASE) 50 MCG/ACT nasal spray 1 spray  1 spray Each Nare QHS Merrily Brittle, DO   1 spray at 04/28/22 2120   hydrOXYzine (ATARAX) tablet 25 mg  25 mg Oral Q6H PRN Merrily Brittle, DO       loperamide (IMODIUM) capsule 2-4 mg  2-4 mg Oral PRN Merrily Brittle, DO       loratadine (CLARITIN) tablet 10 mg  10 mg Oral Daily Merrily Brittle, DO   10  mg at 04/29/22 0901   LORazepam (ATIVAN) tablet 1 mg  1 mg Oral Q6H PRN Merrily Brittle, DO       magnesium hydroxide (MILK OF MAGNESIA) suspension 30 mL  30 mL Oral Daily PRN Merrily Brittle, DO       meloxicam (MOBIC) tablet 7.5 mg  7.5 mg Oral Daily Merrily Brittle, DO   7.5 mg at 04/29/22 0901    methocarbamol (ROBAXIN) tablet 500 mg  500 mg Oral Q8H PRN Corky Sox, MD       multivitamin with minerals tablet 1 tablet  1 tablet Oral Daily Merrily Brittle, DO   1 tablet at 04/29/22 0900   naproxen (NAPROSYN) tablet 500 mg  500 mg Oral BID PRN Corky Sox, MD       nicotine (NICODERM CQ - dosed in mg/24 hours) patch 21 mg  21 mg Transdermal Daily Revonda Humphrey, NP   21 mg at 04/29/22 1962   nicotine polacrilex (NICORETTE) gum 4 mg  4 mg Oral PRN Merrily Brittle, DO       nitroGLYCERIN (NITROSTAT) SL tablet 0.4 mg  0.4 mg Sublingual Q5 min PRN Merrily Brittle, DO       omega-3 acid ethyl esters (LOVAZA) capsule 1 g  1 g Oral Daily Merrily Brittle, DO   1 g at 04/29/22 0901   ondansetron (ZOFRAN-ODT) disintegrating tablet 4 mg  4 mg Oral Q6H PRN Corky Sox, MD       oxymetazoline (AFRIN) 0.05 % nasal spray 1 spray  1 spray Each Nare BID PRN On[place], Chinwendu V, NP   1 spray at 04/29/22 0020   rOPINIRole (REQUIP) tablet 2 mg  2 mg Oral QHS Merrily Brittle, DO   2 mg at 04/28/22 2115   tamsulosin (FLOMAX) capsule 0.4 mg  0.4 mg Oral Daily Merrily Brittle, DO   0.4 mg at 04/29/22 0901   thiamine (VITAMIN B1) tablet 100 mg  100 mg Oral Daily Merrily Brittle, DO   100 mg at 04/29/22 0901   traZODone (DESYREL) tablet 50 mg  50 mg Oral QHS PRN Merrily Brittle, DO   50 mg at 04/27/22 2114   Current Outpatient Medications  Medication Sig Dispense Refill   buprenorphine-naloxone (SUBOXONE) 8-2 mg SUBL SL tablet Place 1 tablet under the tongue 2 (two) times daily.     clopidogrel (PLAVIX) 75 MG tablet Take 75 mg by mouth daily.     DULoxetine (CYMBALTA) 60 MG capsule Take 60 mg by mouth daily.     enalapril (VASOTEC) 2.5 MG tablet Take 2.5 mg by mouth 2 (two) times daily.     meloxicam (MOBIC) 15 MG tablet Take 15 mg by mouth daily.     acetaminophen (TYLENOL) 500 MG tablet Take 2 tablets (1,000 mg total) by mouth every 6 (six) hours. (Patient taking differently: Take 1,000 mg by mouth daily as  needed for headache.) 30 tablet 0   albuterol (VENTOLIN HFA) 108 (90 Base) MCG/ACT inhaler Inhale 2 puffs into the lungs every 6 (six) hours as needed for wheezing or shortness of breath. 18 g 6   aspirin EC 81 MG tablet Take 1 tablet (81 mg total) by mouth daily. Swallow whole. 30 tablet 12   atorvastatin (LIPITOR) 80 MG tablet Take 80 mg by mouth at bedtime.     cetirizine (ZYRTEC) 10 MG tablet TAKE 1 TABLET BY MOUTH EVERY DAY (Patient taking differently: Take 10 mg by mouth daily.) 90 tablet 0   divalproex (DEPAKOTE) 250 MG DR tablet  Take 1 tablet (250 mg total) by mouth 2 (two) times daily. 60 tablet 2   fluticasone (FLONASE) 50 MCG/ACT nasal spray Place 1 spray into both nostrils at bedtime.     Multiple Vitamin (MULTIVITAMIN) tablet Take 1 tablet by mouth daily.     nitroGLYCERIN (NITROSTAT) 0.3 MG SL tablet Place 0.3 mg under the tongue every 5 (five) minutes as needed for chest pain. (Patient not taking: Reported on 02/28/2022)     Omega-3 Fatty Acids (FISH OIL) 1200 MG CAPS Take 1,200 mg by mouth daily.     rOPINIRole (REQUIP) 2 MG tablet TAKE 1 TABLET BY MOUTH EVERYDAY AT BEDTIME (Patient taking differently: Take 2 mg by mouth at bedtime.) 30 tablet 0    Labs  Lab Results:  Admission on 04/26/2022  Component Date Value Ref Range Status   WBC 04/26/2022 7.3  4.0 - 10.5 K/uL Final   RBC 04/26/2022 4.02 (L)  4.22 - 5.81 MIL/uL Final   Hemoglobin 04/26/2022 12.8 (L)  13.0 - 17.0 g/dL Final   HCT 04/26/2022 37.6 (L)  39.0 - 52.0 % Final   MCV 04/26/2022 93.5  80.0 - 100.0 fL Final   MCH 04/26/2022 31.8  26.0 - 34.0 pg Final   MCHC 04/26/2022 34.0  30.0 - 36.0 g/dL Final   RDW 04/26/2022 13.6  11.5 - 15.5 % Final   Platelets 04/26/2022 242  150 - 400 K/uL Final   nRBC 04/26/2022 0.0  0.0 - 0.2 % Final   Neutrophils Relative % 04/26/2022 51  % Final   Neutro Abs 04/26/2022 3.7  1.7 - 7.7 K/uL Final   Lymphocytes Relative 04/26/2022 34  % Final   Lymphs Abs 04/26/2022 2.4  0.7 - 4.0  K/uL Final   Monocytes Relative 04/26/2022 6  % Final   Monocytes Absolute 04/26/2022 0.5  0.1 - 1.0 K/uL Final   Eosinophils Relative 04/26/2022 9  % Final   Eosinophils Absolute 04/26/2022 0.6 (H)  0.0 - 0.5 K/uL Final   Basophils Relative 04/26/2022 0  % Final   Basophils Absolute 04/26/2022 0.0  0.0 - 0.1 K/uL Final   Immature Granulocytes 04/26/2022 0  % Final   Abs Immature Granulocytes 04/26/2022 0.01  0.00 - 0.07 K/uL Final   Performed at [facility], [place] [address] [place], [place] [address]   Sodium 04/26/2022 140  135 - 145 mmol/L Final   Potassium 04/26/2022 3.6  3.5 - 5.1 mmol/L Final   Chloride 04/26/2022 105  98 - 111 mmol/L Final   CO2 04/26/2022 26  22 - 32 mmol/L Final   Glucose, Bld 04/26/2022 92  70 - 99 mg/dL Final   Glucose reference range applies only to samples taken after fasting for at least 8 hours.   BUN 04/26/2022 11  8 - 23 mg/dL Final   Creatinine, Ser 04/26/2022 0.66  0.61 - 1.24 mg/dL Final   Calcium 04/26/2022 9.0  8.9 - 10.3 mg/dL Final   Total Protein 04/26/2022 6.3 (L)  6.5 - 8.1 g/dL Final   Albumin 04/26/2022 3.4 (L)  3.5 - 5.0 g/dL Final   AST 04/26/2022 18  15 - 41 U/L Final   ALT 04/26/2022 30  0 - 44 U/L Final   Alkaline Phosphatase 04/26/2022 83  38 - 126 U/L Final   Total Bilirubin 04/26/2022 <0.1 (L)  0.3 - 1.2 mg/dL Final   GFR, Estimated 04/26/2022 >60  >60 mL/min Final   Comment: (NOTE) Calculated using the CKD-EPI Creatinine Equation (2021)  Anion gap 04/26/2022 9  5 - 15 Final   Performed at [facility] [address] [place], [place] [address]   Hgb A1c MFr Bld 04/26/2022 5.2  4.8 - 5.6 % Final   Comment: (NOTE) Pre diabetes:          5.7%-6.4%  Diabetes:              >6.4%  Glycemic control for   <7.0% adults with diabetes    Mean Plasma Glucose 04/26/2022 102.54  mg/dL Final   Performed at [facility], [place] [address], [place], [place] [address]   Magnesium 04/26/2022 1.9  1.7 - 2.4 mg/dL Final    Performed at [facility] [address], [place], [place] [address]   Cholesterol 04/26/2022 83  0 - 200 mg/dL Final   Triglycerides 04/26/2022 52  <150 mg/dL Final   HDL 04/26/2022 28 (L)  >40 mg/dL Final   Total CHOL/HDL Ratio 04/26/2022 3.0  RATIO Final   VLDL 04/26/2022 10  0 - 40 mg/dL Final   LDL Cholesterol 04/26/2022 45  0 - 99 mg/dL Final   Comment:        Total Cholesterol/HDL:CHD Risk Coronary Heart Disease Risk Table                     Men   Women  1/2 Average Risk   3.4   3.3  Average Risk       5.0   4.4  2 X Average Risk   9.6   7.1  3 X Average Risk  23.4   11.0        Use the calculated Patient Ratio above and the CHD Risk Table to determine the patient's CHD Risk.        ATP III CLASSIFICATION (LDL):  <100     mg/dL   Optimal  100-129  mg/dL   Near or Above                    Optimal  130-159  mg/dL   Borderline  160-189  mg/dL   High  >190     mg/dL   Very High Performed at [facility] [address] [place], [place] [address]    TSH 04/26/2022 0.590  0.350 - 4.500 uIU/mL Final   Comment: Performed by a 3rd Generation assay with a functional sensitivity of <=0.01 uIU/mL. Performed at [facility], [place] [address], [place], [place] [address]    POC Amphetamine UR 04/26/2022 None Detected  NONE DETECTED (Cut Off Level 1000 ng/mL) Final   POC Secobarbital (BAR) 04/26/2022 None Detected  NONE DETECTED (Cut Off Level 300 ng/mL) Final   POC Buprenorphine (BUP) 04/26/2022 None Detected  NONE DETECTED (Cut Off Level 10 ng/mL) Final   POC Oxazepam (BZO) 04/26/2022 None Detected  NONE DETECTED (Cut Off Level 300 ng/mL) Final   POC Cocaine UR 04/26/2022 None Detected  NONE DETECTED (Cut Off Level 300 ng/mL) Final   POC Methamphetamine UR 04/26/2022 None Detected  NONE DETECTED (Cut Off Level 1000 ng/mL) Final   POC Morphine 04/26/2022 None Detected  NONE DETECTED (Cut Off Level 300 ng/mL) Final   POC Methadone UR 04/26/2022 None Detected  NONE  DETECTED (Cut Off Level 300 ng/mL) Final   POC Oxycodone UR 04/26/2022 None Detected  NONE DETECTED (Cut Off Level 100 ng/mL) Final   POC Marijuana UR 04/26/2022 Positive (A)  NONE DETECTED (  Cut Off Level 50 ng/mL) Final   Valproic Acid Lvl 04/26/2022 22 (L)  50.0 - 100.0 ug/mL Final   Performed at [facility] [address], [place], [place] [address]   Ferritin 04/26/2022 80  24 - 336 ng/mL Final   Performed at [facility] [address] [place], [place] [address]   SARS Coronavirus 2 by RT PCR 04/26/2022 NEGATIVE  NEGATIVE Final   Comment: (NOTE) SARS-CoV-2 target nucleic acids are NOT DETECTED.  The SARS-CoV-2 RNA is generally detectable in upper and lower respiratory specimens during the acute phase of infection. The lowest concentration of SARS-CoV-2 viral copies this assay can detect is 250 copies / mL. A negative result does not preclude SARS-CoV-2 infection and should not be used as the sole basis for treatment or other patient management decisions.  A negative result may occur with improper specimen collection / handling, submission of specimen other than nasopharyngeal swab, presence of viral mutation(s) within the areas targeted by this assay, and inadequate number of viral copies (<250 copies / mL). A negative result must be combined with clinical observations, patient history, and epidemiological information.  Fact Sheet for Patients:   https://www.patel.info/  Fact Sheet for Healthcare Providers: https://hall.com/  This test is not yet approved or                           cleared by the [place] FDA and has been authorized for detection and/or diagnosis of SARS-CoV-2 by FDA under an Emergency Use Authorization (EUA).  This EUA will remain in effect (meaning this test can be used) for the duration of the COVID-19 declaration under Section 564(b)(1) of the Act, 21 U.S.C. section 360bbb-3(b)(1), unless the  authorization is terminated or revoked sooner.  Performed at [facility], [place] [address] [place], [place] [address]    SARSCOV2ONAVIRUS 2 AG 04/26/2022 NEGATIVE  NEGATIVE Final   Comment: (NOTE) SARS-CoV-2 antigen NOT DETECTED.   Negative results are presumptive.  Negative results do not preclude SARS-CoV-2 infection and should not be used as the sole basis for treatment or other patient management decisions, including infection  control decisions, particularly in the presence of clinical signs and  symptoms consistent with COVID-19, or in those who have been in contact with the virus.  Negative results must be combined with clinical observations, patient history, and epidemiological information. The expected result is Negative.  Fact Sheet for Patients: HandmadeRecipes.com.cy  Fact Sheet for Healthcare Providers: FuneralLife.at  This test is not yet approved or cleared by the [place] FDA and  has been authorized for detection and/or diagnosis of SARS-CoV-2 by FDA under an Emergency Use Authorization (EUA).  This EUA will remain in effect (meaning this test can be used) for the duration of  the COV                          ID-19 declaration under Section 564(b)(1) of the Act, 21 U.S.C. section 360bbb-3(b)(1), unless the authorization is terminated or revoked sooner.    Admission on 02/28/2022, Discharged on 03/02/2022  Component Date Value Ref Range Status   Color, Urine 02/28/2022 YELLOW  YELLOW Final   APPearance 02/28/2022 CLEAR  CLEAR Final   Specific Gravity, Urine 02/28/2022 1.023  1.005 - 1.030 Final   pH 02/28/2022 7.0  5.0 - 8.0 Final   Glucose, UA 02/28/2022 NEGATIVE  NEGATIVE mg/dL Final   Hgb  urine dipstick 02/28/2022 SMALL (A)  NEGATIVE Final   Bilirubin Urine 02/28/2022 NEGATIVE  NEGATIVE Final   Ketones, ur 02/28/2022 5 (A)  NEGATIVE mg/dL Final   Protein, ur 02/28/2022 NEGATIVE  NEGATIVE mg/dL  Final   Nitrite 02/28/2022 NEGATIVE  NEGATIVE Final   Leukocytes,Ua 02/28/2022 NEGATIVE  NEGATIVE Final   RBC / HPF 02/28/2022 11-20  0 - 5 RBC/hpf Final   WBC, UA 02/28/2022 0-5  0 - 5 WBC/hpf Final   Bacteria, UA 02/28/2022 NONE SEEN  NONE SEEN Final   Mucus 02/28/2022 PRESENT   Final   Performed at [facility], [place] [address], [place], [place] [address]   Specimen Description 02/28/2022    Final                   Value:URINE, CLEAN CATCH Performed at [facility], [place] [address] [place], [place] [address]    Special Requests 02/28/2022    Final                   Value:NONE Performed at [facility], [place] [address], [place], [place] [address]    Culture 02/28/2022    Final                   Value:NO GROWTH Performed at [facility], [place] [address], [place],  [address]    Report Status 02/28/2022 03/03/2022 FINAL   Final   Sodium 02/28/2022 137  135 - 145 mmol/L Final   Potassium 02/28/2022 3.7  3.5 - 5.1 mmol/L Final   Chloride 02/28/2022 104  98 - 111 mmol/L Final   CO2 02/28/2022 24  22 - 32 mmol/L Final   Glucose, Bld 02/28/2022 106 (H)  70 - 99 mg/dL Final   Glucose reference range applies only to samples taken after fasting for at least 8 hours.   BUN 02/28/2022 12  8 - 23 mg/dL Final   Creatinine, Ser 02/28/2022 0.77  0.61 - 1.24 mg/dL Final   Calcium 02/28/2022 8.9  8.9 - 10.3 mg/dL Final   Total Protein 02/28/2022 7.5  6.5 - 8.1 g/dL Final   Albumin 02/28/2022 3.6  3.5 - 5.0 g/dL Final   AST 02/28/2022 25  15 - 41 U/L Final   ALT 02/28/2022 36  0 - 44 U/L Final   Alkaline Phosphatase 02/28/2022 112  38 - 126 U/L Final   Total Bilirubin 02/28/2022 0.6  0.3 - 1.2 mg/dL Final   GFR, Estimated 02/28/2022 >60  >60 mL/min Final   Comment: (NOTE) Calculated using the CKD-EPI Creatinine Equation (2021)    Anion gap 02/28/2022 9  5 - 15 Final   Performed at [facility], [place]  [address] [place], [place] [address]   WBC 02/28/2022 10.0  4.0 - 10.5 K/uL Final   RBC 02/28/2022 4.26  4.22 - 5.81 MIL/uL Final   Hemoglobin 02/28/2022 13.2  13.0 - 17.0 g/dL Final   HCT 02/28/2022 38.7 (L)  39.0 - 52.0 % Final   MCV 02/28/2022 90.8  80.0 - 100.0 fL Final   MCH 02/28/2022 31.0  26.0 - 34.0 pg Final   MCHC 02/28/2022 34.1  30.0 - 36.0 g/dL Final   RDW 02/28/2022 13.0  11.5 - 15.5 % Final   Platelets 02/28/2022 316  150 - 400 K/uL Final   nRBC 02/28/2022 0.0  0.0 - 0.2 % Final   Neutrophils Relative % 02/28/2022 72  % Final  Neutro Abs 02/28/2022 7.2  1.7 - 7.7 K/uL Final   Lymphocytes Relative 02/28/2022 20  % Final   Lymphs Abs 02/28/2022 2.0  0.7 - 4.0 K/uL Final   Monocytes Relative 02/28/2022 5  % Final   Monocytes Absolute 02/28/2022 0.5  0.1 - 1.0 K/uL Final   Eosinophils Relative 02/28/2022 3  % Final   Eosinophils Absolute 02/28/2022 0.3  0.0 - 0.5 K/uL Final   Basophils Relative 02/28/2022 0  % Final   Basophils Absolute 02/28/2022 0.0  0.0 - 0.1 K/uL Final   Immature Granulocytes 02/28/2022 0  % Final   Abs Immature Granulocytes 02/28/2022 0.02  0.00 - 0.07 K/uL Final   Performed at [facility], [place] [address], [place], [place] [address]   Lipase 02/28/2022 40  11 - 51 U/L Final   Performed at [facility], [place] [address], [place], [place] [address]   Sodium 03/01/2022 141  135 - 145 mmol/L Final   Potassium 03/01/2022 3.2 (L)  3.5 - 5.1 mmol/L Final   Chloride 03/01/2022 110  98 - 111 mmol/L Final   CO2 03/01/2022 22  22 - 32 mmol/L Final   Glucose, Bld 03/01/2022 92  70 - 99 mg/dL Final   Glucose reference range applies only to samples taken after fasting for at least 8 hours.   BUN 03/01/2022 13  8 - 23 mg/dL Final   Creatinine, Ser 03/01/2022 0.73  0.61 - 1.24 mg/dL Final   Calcium 03/01/2022 8.5 (L)  8.9 - 10.3 mg/dL Final   Total Protein 03/01/2022 6.4 (L)  6.5 - 8.1 g/dL Final   Albumin 03/01/2022 3.2 (L)   3.5 - 5.0 g/dL Final   AST 03/01/2022 19  15 - 41 U/L Final   ALT 03/01/2022 29  0 - 44 U/L Final   Alkaline Phosphatase 03/01/2022 89  38 - 126 U/L Final   Total Bilirubin 03/01/2022 0.7  0.3 - 1.2 mg/dL Final   GFR, Estimated 03/01/2022 >60  >60 mL/min Final   Comment: (NOTE) Calculated using the CKD-EPI Creatinine Equation (2021)    Anion gap 03/01/2022 9  5 - 15 Final   Performed at [facility], [place] [address] [place], [place] [address]   WBC 03/01/2022 12.5 (H)  4.0 - 10.5 K/uL Final   RBC 03/01/2022 3.90 (L)  4.22 - 5.81 MIL/uL Final   Hemoglobin 03/01/2022 12.1 (L)  13.0 - 17.0 g/dL Final   HCT 03/01/2022 36.4 (L)  39.0 - 52.0 % Final   MCV 03/01/2022 93.3  80.0 - 100.0 fL Final   MCH 03/01/2022 31.0  26.0 - 34.0 pg Final   MCHC 03/01/2022 33.2  30.0 - 36.0 g/dL Final   RDW 03/01/2022 13.1  11.5 - 15.5 % Final   Platelets 03/01/2022 272  150 - 400 K/uL Final   nRBC 03/01/2022 0.0  0.0 - 0.2 % Final   Performed at [facility], [place] [address], [place], [place] [address]   WBC 03/02/2022 10.1  4.0 - 10.5 K/uL Final   RBC 03/02/2022 3.64 (L)  4.22 - 5.81 MIL/uL Final   Hemoglobin 03/02/2022 11.4 (L)  13.0 - 17.0 g/dL Final   HCT 03/02/2022 34.0 (L)  39.0 - 52.0 % Final   MCV 03/02/2022 93.4  80.0 - 100.0 fL Final   MCH 03/02/2022 31.3  26.0 - 34.0 pg Final   MCHC 03/02/2022 33.5  30.0 - 36.0 g/dL Final   RDW 03/02/2022 13.1  11.5 - 15.5 % Final   Platelets 03/02/2022 225  150 - 400 K/uL Final   nRBC 03/02/2022 0.0  0.0 - 0.2 % Final   Performed at [facility], [place] [address], [place], [place] [address]   Sodium 03/02/2022 140  135 - 145 mmol/L Final   Potassium 03/02/2022 3.7  3.5 - 5.1 mmol/L Final   Chloride 03/02/2022 113 (H)  98 - 111 mmol/L Final   CO2 03/02/2022 21 (L)  22 - 32 mmol/L Final   Glucose, Bld 03/02/2022 145 (H)  70 - 99 mg/dL Final   Glucose reference range applies only to samples taken after  fasting for at least 8 hours.   BUN 03/02/2022 13  8 - 23 mg/dL Final   Creatinine, Ser 03/02/2022 0.82  0.61 - 1.24 mg/dL Final   Calcium 03/02/2022 8.4 (L)  8.9 - 10.3 mg/dL Final   GFR, Estimated 03/02/2022 >60  >60 mL/min Final   Comment: (NOTE) Calculated using the CKD-EPI Creatinine Equation (2021)    Anion gap 03/02/2022 6  5 - 15 Final   Performed at [facility], [place] [address] [place], [place] [address]  Admission on 02/16/2022, Discharged on 02/18/2022  Component Date Value Ref Range Status   WBC 02/16/2022 9.6  4.0 - 10.5 K/uL Final   RBC 02/16/2022 4.67  4.22 - 5.81 MIL/uL Final   Hemoglobin 02/16/2022 14.7  13.0 - 17.0 g/dL Final   HCT 02/16/2022 42.4  39.0 - 52.0 % Final   MCV 02/16/2022 90.8  80.0 - 100.0 fL Final   MCH 02/16/2022 31.5  26.0 - 34.0 pg Final   MCHC 02/16/2022 34.7  30.0 - 36.0 g/dL Final   RDW 02/16/2022 13.2  11.5 - 15.5 % Final   Platelets 02/16/2022 275  150 - 400 K/uL Final   nRBC 02/16/2022 0.0  0.0 - 0.2 % Final   Neutrophils Relative % 02/16/2022 76  % Final   Neutro Abs 02/16/2022 7.3  1.7 - 7.7 K/uL Final   Lymphocytes Relative 02/16/2022 18  % Final   Lymphs Abs 02/16/2022 1.7  0.7 - 4.0 K/uL Final   Monocytes Relative 02/16/2022 5  % Final   Monocytes Absolute 02/16/2022 0.4  0.1 - 1.0 K/uL Final   Eosinophils Relative 02/16/2022 1  % Final   Eosinophils Absolute 02/16/2022 0.1  0.0 - 0.5 K/uL Final   Basophils Relative 02/16/2022 0  % Final   Basophils Absolute 02/16/2022 0.0  0.0 - 0.1 K/uL Final   Immature Granulocytes 02/16/2022 0  % Final   Abs Immature Granulocytes 02/16/2022 0.03  0.00 - 0.07 K/uL Final   Performed at [facility], [place] [address] [place], [place] [address]   Sodium 02/16/2022 134 (L)  135 - 145 mmol/L Final   Potassium 02/16/2022 3.8  3.5 - 5.1 mmol/L Final   Chloride 02/16/2022 100  98 - 111 mmol/L Final   CO2 02/16/2022 25  22 - 32 mmol/L Final   Glucose, Bld 02/16/2022 108 (H)  70 -  99 mg/dL Final   Glucose reference range applies only to samples taken after fasting for at least 8 hours.   BUN 02/16/2022 12  8 - 23 mg/dL Final   Creatinine, Ser 02/16/2022 0.91  0.61 - 1.24 mg/dL Final   Calcium 02/16/2022 9.4  8.9 - 10.3 mg/dL Final   Total Protein 02/16/2022 7.5  6.5 - 8.1 g/dL Final   Albumin 02/16/2022 4.0  3.5 - 5.0  g/dL Final   AST 02/16/2022 20  15 - 41 U/L Final   ALT 02/16/2022 17  0 - 44 U/L Final   Alkaline Phosphatase 02/16/2022 64  38 - 126 U/L Final   Total Bilirubin 02/16/2022 0.8  0.3 - 1.2 mg/dL Final   GFR, Estimated 02/16/2022 >60  >60 mL/min Final   Comment: (NOTE) Calculated using the CKD-EPI Creatinine Equation (2021)    Anion gap 02/16/2022 9  5 - 15 Final   Performed at [place] [address] [place], [place] [address]   Troponin I (High Sensitivity) 02/16/2022 20 (H)  <18 ng/L Final   Comment: (NOTE) Elevated high sensitivity troponin I (hsTnI) values and significant  changes across serial measurements may suggest ACS but many other  chronic and acute conditions are known to elevate hsTnI results.  Refer to the "Links" section for chest pain algorithms and additional  guidance. Performed at [facility], [place] [address], [place], [place] [address]    Lipase 02/16/2022 31  11 - 51 U/L Final   Performed at [place] [address], [place], [place] [address]   Troponin I (High Sensitivity) 02/17/2022 29 (H)  <18 ng/L Final   Comment: (NOTE) Elevated high sensitivity troponin I (hsTnI) values and significant  changes across serial measurements may suggest ACS but many other  chronic and acute conditions are known to elevate hsTnI results.  Refer to the "Links" section for chest pain algorithms and additional  guidance. Performed at [facility], [place] [address], [place], [place] [address]    pH, Arterial 02/17/2022 7.499 (H)  7.35 - 7.45 Final   pCO2 arterial 02/17/2022 26.7 (L)  32 - 48 mmHg Final    pO2, Arterial 02/17/2022 105  83 - 108 mmHg Final   Bicarbonate 02/17/2022 20.8  20.0 - 28.0 mmol/L Final   TCO2 02/17/2022 22  22 - 32 mmol/L Final   O2 Saturation 02/17/2022 99  % Final   Acid-base deficit 02/17/2022 1.0  0.0 - 2.0 mmol/L Final   Sodium 02/17/2022 135  135 - 145 mmol/L Final   Potassium 02/17/2022 3.6  3.5 - 5.1 mmol/L Final   Calcium, Ion 02/17/2022 1.20  1.15 - 1.40 mmol/L Final   HCT 02/17/2022 41.0  39.0 - 52.0 % Final   Hemoglobin 02/17/2022 13.9  13.0 - 17.0 g/dL Final   Patient temperature 02/17/2022 98.3 F   Final   Collection site 02/17/2022 RADIAL, ALLEN'S TEST ACCEPTABLE   Final   Drawn by 02/17/2022 Operator   Final   Sample type 02/17/2022 ARTERIAL   Final   SURGICAL PATHOLOGY 02/17/2022    Final-Edited                   Value:SURGICAL PATHOLOGY CASE: [id number] PATIENT: Almon Hercules Surgical Pathology Report     Clinical History: symptomatic cholelithiasis (cm)     FINAL MICROSCOPIC DIAGNOSIS:  A. GALLBLADDER, CHOLECYSTECTOMY: Chronic cholecystitis. Cholesterolosis. Cholelithiasis.  GROSS DESCRIPTION:  Size/?Intact: Received fresh is an 11.2 x 5.5 x 1.3 cm disrupted gallbladder Serosal surface: Tan, green-tinged, and moderately roughened Mucosa/Wall: Green and velvety without distinct lesions and a wall thickness of 0.1 cm Contents: Green viscous bile and a single black, round calculus measuring 1.0 cm Cystic duct: Probed and found to be patent Block Summary: Representative wall and the cystic duct margin are submitted in 1 block. Amparo [place], 02/17/2022)     Final Diagnosis performed by Verdie Mosher, MD.  Electronically signed 02/18/2022 Technical component performed at [employer], [place] [address] [place], [place] [address].  Professional component perfo                         rmed at [facility], [place] [address] [place], [place] [address].  Immunohistochemistry Technical component (if  applicable) was performed at [facility]. [address] [place], [place], [place] [address].   IMMUNOHISTOCHEMISTRY DISCLAIMER (if applicable): Some of these immunohistochemical stains may have been developed and the performance characteristics determine by [facility]. Some may not have been cleared or approved by the U.S. Food and Drug Administration. The FDA has determined that such clearance or approval is not necessary. This test is used for clinical purposes. It should not be regarded as investigational or for research. This laboratory is certified under the [place] (CLIA-88) as qualified to perform high complexity clinical laboratory testing.  The controls stained appropriately.   Admission on 02/14/2022, Discharged on 02/14/2022  Component Date Value Ref Range Status   Sodium 02/14/2022 139  135 - 145 mmol/L Final   Potassium 02/14/2022 4.3  3.5 - 5.1 mmol/L Final   Chloride 02/14/2022 106  98 - 111 mmol/L Final   CO2 02/14/2022 23  22 - 32 mmol/L Final   Glucose, Bld 02/14/2022 94  70 - 99 mg/dL Final   Glucose reference range applies only to samples taken after fasting for at least 8 hours.   BUN 02/14/2022 11  8 - 23 mg/dL Final   Creatinine, Ser 02/14/2022 0.94  0.61 - 1.24 mg/dL Final   Calcium 02/14/2022 9.2  8.9 - 10.3 mg/dL Final   Total Protein 02/14/2022 7.1  6.5 - 8.1 g/dL Final   Albumin 02/14/2022 3.9  3.5 - 5.0 g/dL Final   AST 02/14/2022 21  15 - 41 U/L Final   ALT 02/14/2022 14  0 - 44 U/L Final   Alkaline Phosphatase 02/14/2022 64  38 - 126 U/L Final   Total Bilirubin 02/14/2022 0.5  0.3 - 1.2 mg/dL Final   GFR, Estimated 02/14/2022 >60  >60 mL/min Final   Comment: (NOTE) Calculated using the CKD-EPI Creatinine Equation (2021)    Anion gap 02/14/2022 10  5 - 15 Final   Performed at [place] [address], [place], [place] [address]   Lipase 02/14/2022 32  11 - 51 U/L  Final   Performed at [place] [address] [place], [place] [address]   WBC 02/14/2022 8.6  4.0 - 10.5 K/uL Final   RBC 02/14/2022 4.96  4.22 - 5.81 MIL/uL Final   Hemoglobin 02/14/2022 15.7  13.0 - 17.0 g/dL Final   HCT 02/14/2022 47.1  39.0 - 52.0 % Final   MCV 02/14/2022 95.0  80.0 - 100.0 fL Final   MCH 02/14/2022 31.7  26.0 - 34.0 pg Final   MCHC 02/14/2022 33.3  30.0 - 36.0 g/dL Final   RDW 02/14/2022 13.2  11.5 - 15.5 % Final   Platelets 02/14/2022 251  150 - 400 K/uL Final   nRBC 02/14/2022 0.0  0.0 - 0.2 % Final   Neutrophils Relative % 02/14/2022 65  % Final   Neutro Abs 02/14/2022 5.5  1.7 - 7.7 K/uL Final   Lymphocytes Relative 02/14/2022 25  % Final   Lymphs Abs 02/14/2022 2.2  0.7 - 4.0 K/uL Final   Monocytes Relative  02/14/2022 5  % Final   Monocytes Absolute 02/14/2022 0.4  0.1 - 1.0 K/uL Final   Eosinophils Relative 02/14/2022 4  % Final   Eosinophils Absolute 02/14/2022 0.4  0.0 - 0.5 K/uL Final   Basophils Relative 02/14/2022 1  % Final   Basophils Absolute 02/14/2022 0.1  0.0 - 0.1 K/uL Final   Immature Granulocytes 02/14/2022 0  % Final   Abs Immature Granulocytes 02/14/2022 0.02  0.00 - 0.07 K/uL Final   Performed at [facility], [place] [address] [place], [place] [address]  Admission on 12/16/2021, Discharged on 12/17/2021  Component Date Value Ref Range Status   Lipase 12/16/2021 38  11 - 51 U/L Final   Performed at [facility], [place] [address], [place], [place] [address]   Sodium 12/16/2021 138  135 - 145 mmol/L Final   Potassium 12/16/2021 3.4 (L)  3.5 - 5.1 mmol/L Final   Chloride 12/16/2021 104  98 - 111 mmol/L Final   CO2 12/16/2021 25  22 - 32 mmol/L Final   Glucose, Bld 12/16/2021 107 (H)  70 - 99 mg/dL Final   Glucose reference range applies only to samples taken after fasting for at least 8 hours.   BUN 12/16/2021 18  8 - 23 mg/dL Final   Creatinine, Ser 12/16/2021 0.77  0.61 - 1.24 mg/dL Final   Calcium  12/16/2021 8.8 (L)  8.9 - 10.3 mg/dL Final   Total Protein 12/16/2021 8.1  6.5 - 8.1 g/dL Final   Albumin 12/16/2021 4.3  3.5 - 5.0 g/dL Final   AST 12/16/2021 19  15 - 41 U/L Final   ALT 12/16/2021 16  0 - 44 U/L Final   Alkaline Phosphatase 12/16/2021 68  38 - 126 U/L Final   Total Bilirubin 12/16/2021 0.5  0.3 - 1.2 mg/dL Final   GFR, Estimated 12/16/2021 >60  >60 mL/min Final   Comment: (NOTE) Calculated using the CKD-EPI Creatinine Equation (2021)    Anion gap 12/16/2021 9  5 - 15 Final   Performed at [facility], [place] [address], [place], [place] [address]   WBC 12/16/2021 11.7 (H)  4.0 - 10.5 K/uL Final   RBC 12/16/2021 4.07 (L)  4.22 - 5.81 MIL/uL Final   Hemoglobin 12/16/2021 13.0  13.0 - 17.0 g/dL Final   HCT 12/16/2021 37.6 (L)  39.0 - 52.0 % Final   MCV 12/16/2021 92.4  80.0 - 100.0 fL Final   MCH 12/16/2021 31.9  26.0 - 34.0 pg Final   MCHC 12/16/2021 34.6  30.0 - 36.0 g/dL Final   RDW 12/16/2021 14.1  11.5 - 15.5 % Final   Platelets 12/16/2021 293  150 - 400 K/uL Final   nRBC 12/16/2021 0.0  0.0 - 0.2 % Final   Neutrophils Relative % 12/16/2021 70  % Final   Neutro Abs 12/16/2021 8.2 (H)  1.7 - 7.7 K/uL Final   Lymphocytes Relative 12/16/2021 23  % Final   Lymphs Abs 12/16/2021 2.6  0.7 - 4.0 K/uL Final   Monocytes Relative 12/16/2021 6  % Final   Monocytes Absolute 12/16/2021 0.7  0.1 - 1.0 K/uL Final   Eosinophils Relative 12/16/2021 1  % Final   Eosinophils Absolute 12/16/2021 0.1  0.0 - 0.5 K/uL Final   Basophils Relative 12/16/2021 0  % Final   Basophils Absolute 12/16/2021 0.0  0.0 - 0.1 K/uL Final   Immature Granulocytes 12/16/2021 0  % Final   Abs Immature Granulocytes 12/16/2021 0.04  0.00 - 0.07 K/uL Final   Performed at [facility], [place] [address], [place], [place] [address]   Color, Urine 12/16/2021 YELLOW  YELLOW Final   APPearance 12/16/2021 CLEAR  CLEAR Final   Specific Gravity, Urine 12/16/2021 1.015  1.005 -  1.030 Final   pH 12/16/2021 7.0  5.0 - 8.0 Final   Glucose, UA 12/16/2021 NEGATIVE  NEGATIVE mg/dL Final   Hgb urine dipstick 12/16/2021 SMALL (A)  NEGATIVE Final   Bilirubin Urine 12/16/2021 NEGATIVE  NEGATIVE Final   Ketones, ur 12/16/2021 NEGATIVE  NEGATIVE mg/dL Final   Protein, ur 12/16/2021 NEGATIVE  NEGATIVE mg/dL Final   Nitrite 12/16/2021 NEGATIVE  NEGATIVE Final   Leukocytes,Ua 12/16/2021 NEGATIVE  NEGATIVE Final   RBC / HPF 12/16/2021 6-10  0 - 5 RBC/hpf Final   WBC, UA 12/16/2021 0-5  0 - 5 WBC/hpf Final   Bacteria, UA 12/16/2021 NONE SEEN  NONE SEEN Final   Mucus 12/16/2021 PRESENT   Final   Amorphous Crystal 12/16/2021 PRESENT   Final   Performed at [facility], [place] [address], [place], [place] [address]   Total CK 12/16/2021 99  49 - 397 U/L Final   Performed at [facility], [place] [address] [place], [place] [address]   Troponin I (High Sensitivity) 12/16/2021 23 (H)  <18 ng/L Final   Comment: (NOTE) Elevated high sensitivity troponin I (hsTnI) values and significant  changes across serial measurements may suggest ACS but many other  chronic and acute conditions are known to elevate hsTnI results.  Refer to the "Links" section for chest pain algorithms and additional  guidance. Performed at [facility], [place] [address], [place], [place] [address]    Troponin I (High Sensitivity) 12/17/2021 48 (H)  <18 ng/L Final   Comment: DELTA CHECK NOTED (NOTE) Elevated high sensitivity troponin I (hsTnI) values and significant  changes across serial measurements may suggest ACS but many other  chronic and acute conditions are known to elevate hsTnI results.  Refer to the Links section for chest pain algorithms and additional  guidance. Performed at [facility], [place] [address], [place], [place] [address]    HIV Screen 4th Generation wRfx 12/17/2021 Non Reactive  Non Reactive Final   Performed at  [facility], [place] [address], [place], [place] [address]   Lipoprotein (a) 12/17/2021 27.0  <75.0 nmol/L Final   Comment: (NOTE) Note:  Values greater than or equal to 75.0 nmol/L may       indicate an independent risk factor for CHD,       but must be evaluated with caution when applied       to non-Caucasian populations due to the       influence of genetic factors on Lp(a) across       ethnicities. Performed At: [facility] [place], [place] [id number] Rush Farmer MD OQ:[id number]    Opiates 12/16/2021 NONE DETECTED  NONE DETECTED Final   Cocaine 12/16/2021 NONE DETECTED  NONE DETECTED Final   Benzodiazepines 12/16/2021 NONE DETECTED  NONE DETECTED Final   Amphetamines 12/16/2021 NONE DETECTED  NONE DETECTED Final   Tetrahydrocannabinol 12/16/2021 NONE DETECTED  NONE DETECTED Final   Barbiturates 12/16/2021 NONE DETECTED  NONE DETECTED Final   Comment: (NOTE) DRUG SCREEN FOR MEDICAL PURPOSES ONLY.  IF CONFIRMATION IS NEEDED FOR ANY PURPOSE, NOTIFY LAB WITHIN 5 DAYS.  LOWEST DETECTABLE LIMITS FOR URINE DRUG SCREEN Drug Class  Cutoff (ng/mL) Amphetamine and metabolites    1000 Barbiturate and metabolites    200 Benzodiazepine                 703 Tricyclics and metabolites     300 Opiates and metabolites        300 Cocaine and metabolites        300 THC                            50 Performed at [facility], [place] [address], [place], [place] [address]    SARS Coronavirus 2 by RT PCR 12/17/2021 NEGATIVE  NEGATIVE Final   Comment: (NOTE) SARS-CoV-2 target nucleic acids are NOT DETECTED.  The SARS-CoV-2 RNA is generally detectable in upper and lower respiratory specimens during the acute phase of infection. The lowest concentration of SARS-CoV-2 viral copies this assay can detect is 250 copies / mL. A negative result does not preclude SARS-CoV-2 infection and should not be used as the sole basis for  treatment or other patient management decisions.  A negative result may occur with improper specimen collection / handling, submission of specimen other than nasopharyngeal swab, presence of viral mutation(s) within the areas targeted by this assay, and inadequate number of viral copies (<250 copies / mL). A negative result must be combined with clinical observations, patient history, and epidemiological information.  Fact Sheet for Patients:   https://www.patel.info/  Fact Sheet for Healthcare Providers: https://hall.com/  This test is not yet approved or                           cleared by the [place] FDA and has been authorized for detection and/or diagnosis of SARS-CoV-2 by FDA under an Emergency Use Authorization (EUA).  This EUA will remain in effect (meaning this test can be used) for the duration of the COVID-19 declaration under Section 564(b)(1) of the Act, 21 U.S.C. section 360bbb-3(b)(1), unless the authorization is terminated or revoked sooner.  Performed at [facility], [place] [address], [place], [place] [address]    Sodium 12/17/2021 137  135 - 145 mmol/L Final   Potassium 12/17/2021 3.5  3.5 - 5.1 mmol/L Final   Chloride 12/17/2021 103  98 - 111 mmol/L Final   CO2 12/17/2021 27  22 - 32 mmol/L Final   Glucose, Bld 12/17/2021 84  70 - 99 mg/dL Final   Glucose reference range applies only to samples taken after fasting for at least 8 hours.   BUN 12/17/2021 13  8 - 23 mg/dL Final   Creatinine, Ser 12/17/2021 0.85  0.61 - 1.24 mg/dL Final   Calcium 12/17/2021 8.6 (L)  8.9 - 10.3 mg/dL Final   Total Protein 12/17/2021 6.2 (L)  6.5 - 8.1 g/dL Final   Albumin 12/17/2021 3.2 (L)  3.5 - 5.0 g/dL Final   AST 12/17/2021 17  15 - 41 U/L Final   ALT 12/17/2021 15  0 - 44 U/L Final   Alkaline Phosphatase 12/17/2021 56  38 - 126 U/L Final   Total Bilirubin 12/17/2021 0.4  0.3 - 1.2 mg/dL Final   GFR,  Estimated 12/17/2021 >60  >60 mL/min Final   Comment: (NOTE) Calculated using the CKD-EPI Creatinine Equation (2021)    Anion gap 12/17/2021 7  5 - 15 Final   Performed at [place] [address] [place], [place] [address]  Heparin Unfractionated 12/17/2021 <0.10 (L)  0.30 - 0.70 IU/mL Final   Comment: (NOTE) The clinical reportable range upper limit is being lowered to >1.10 to align with the FDA approved guidance for the current laboratory assay.  If heparin results are below expected values, and patient dosage has  been confirmed, suggest follow up testing of antithrombin III levels. Performed at [facility], [place] [address] [place], [place] [address]    WBC 12/17/2021 7.4  4.0 - 10.5 K/uL Final   RBC 12/17/2021 3.72 (L)  4.22 - 5.81 MIL/uL Final   Hemoglobin 12/17/2021 11.4 (L)  13.0 - 17.0 g/dL Final   HCT 12/17/2021 35.4 (L)  39.0 - 52.0 % Final   MCV 12/17/2021 95.2  80.0 - 100.0 fL Final   MCH 12/17/2021 30.6  26.0 - 34.0 pg Final   MCHC 12/17/2021 32.2  30.0 - 36.0 g/dL Final   RDW 12/17/2021 14.3  11.5 - 15.5 % Final   Platelets 12/17/2021 241  150 - 400 K/uL Final   nRBC 12/17/2021 0.0  0.0 - 0.2 % Final   Performed at [place] [address], [place], [place] [address]    Blood Alcohol level:  Lab Results  Component Value Date   ETH <10 09/24/2020   ETH <10 15/17/6160    Metabolic Disorder Labs: Lab Results  Component Value Date   HGBA1C 5.2 04/26/2022   MPG 102.54 04/26/2022   MPG 103 12/28/2016   No results found for: "PROLACTIN" Lab Results  Component Value Date   CHOL 83 04/26/2022   TRIG 52 04/26/2022   HDL 28 (L) 04/26/2022   CHOLHDL 3.0 04/26/2022   VLDL 10 04/26/2022   LDLCALC 45 04/26/2022   LDLCALC 41 08/31/2021    Therapeutic Lab Levels: No results found for: "LITHIUM" Lab Results  Component Value Date   VALPROATE 22 (L) 04/26/2022   VALPROATE 18 (L) 10/07/2021   No results found for:  "CBMZ"  Physical Findings   AIMS    Flowsheet Row Admission (Discharged) from 12/27/2016 in [place] Total Score 1      AUDIT    Flowsheet Row Admission (Discharged) from 12/27/2016 in [place]  Alcohol Use Disorder Identification Test Final Score (AUDIT) 32      GAD-7    [place] from 06/03/2019 in [place]  Total GAD-7 Score 14      PHQ2-9    [place] Office Visit from 01/03/2022 in [place] Office Visit from 08/31/2021 in [place] Visit from 06/21/2021 in [place] from 03/12/2021 in [facility] Office Visit from 01/29/2021 in [place]  PHQ-2 Total Score 2 0 '2 4 4  '$ PHQ-9 Total Score '5 1 7 16 11      '$ [place] [facility] from 04/26/2022 in [facility] to Hosp-Admission (Discharged) from 02/28/2022 in [facility] to Hosp-Admission (Discharged) from 02/16/2022 in [place] No Risk No Risk No Risk        Musculoskeletal  Strength & Muscle Tone: within normal limits Gait & Station: normal Patient leans: N/A  Psychiatric Specialty Exam  Presentation  General Appearance:  Appropriate for Environment; Fairly Groomed  Eye Contact: Fair  Speech: Clear and Coherent; Normal Rate  Speech Volume:  Normal  Handedness: Right   Mood and Affect  Mood: Euthymic  Affect: Appropriate   Thought Process  Thought Processes: Coherent  Descriptions of Associations:Intact  Orientation:Full (Time, Place and Person)  Thought Content:Logical  Diagnosis of Schizophrenia or Schizoaffective disorder in past: yes  Hallucinations: denies Ideas of Reference:None  Suicidal Thoughts: denies Homicidal Thoughts:  none  Sensorium  Memory: Immediate Fair; Recent Fair  Judgment: Fair  Insight: Fair   Community education officer  Concentration: Fair  Attention Span: Fair  Recall: [employer] of Knowledge: Fair  Language: Fair   Psychomotor Activity  Psychomotor Activity:No data recorded  Assets  Assets: Communication Skills; Desire for Improvement; Social Support   Sleep  Sleep: good  Physical Exam  Physical Exam Constitutional:      Appearance: the patient is not toxic-appearing.  Pulmonary:     Effort: Pulmonary effort is normal.  Neurological:     General: No focal deficit present.     Mental Status: the patient is alert and oriented to person, place, and time.   Review of Systems  Respiratory:  Negative for shortness of breath.   Cardiovascular:  Negative for chest pain.  Gastrointestinal:  Negative for abdominal pain, constipation, diarrhea, nausea and vomiting.  Neurological:  Negative for headaches.   Blood pressure 138/71, pulse 60, temperature 97.9 F (36.6 C), temperature source Oral, resp. rate 18, SpO2 97 %. There is no height or weight on file to calculate BMI.  Treatment Plan Summary: Daily contact with patient to assess and evaluate symptoms and progress in treatment and Medication management   Continue home medications as ordered.  Added Tums twice daily for heartburn  We will taper the patient off Suboxone as follows: - 10/6: 8 mg in the morning, no nightly dose - 10/7: 4 mg in the morning, no nightly dose - 10/8: Discontinue - 10/9: Patient goes to [place]: Patient accepted to [facility] for 9 AM Monday.  Can consider deferring admission if the patient experiences withdrawal symptoms, low concern for this.    Corky Sox, MD 04/29/2022 2:40 PM

## 2022-04-29 NOTE — ED Notes (Addendum)
Pt came to nurse and requested for medication for congestion. Provider notified.

## 2022-04-29 NOTE — Progress Notes (Signed)
LCSW Progress Note   8144 - LCSW made referral on behalf of the pt to Donata Duff @ [place], [phone] for residential treatment.  Pt has an appointment on 02 May 2022 @ 0900.   Omelia Blackwater, MSW, [place] [facility] [phone] phone [phone] work mobile

## 2022-04-29 NOTE — ED Notes (Signed)
SPIRITUALITY GROUP NOTE  Spirituality group facilitated by Simone Curia, MDiv, [place].  Group Description:  Group focused on topic of hope.  Patients participated in facilitated discussion around topic, connecting with one another around experiences and definitions for hope.  Group engaged in discussion around how their definitions of hope are present today in hospital.   Modalities: Psycho-social ed, Adlerian, Narrative, MI Patient Progress: Keith Gibbs was present throughout group.  Engaged with facilitator and other group members.  Keith Gibbs spoke with other group members about his motivations for seeking support at [facility].  Connected with another group member around the challenge of "holding on to messages others give [place], rather than what is true for [place]"  Spoke of the messages he heard from his mother prior to admission to [facility] and practiced positive thinking and letting go of messages that were untrue.

## 2022-04-30 DIAGNOSIS — F1410 Cocaine abuse, uncomplicated: Secondary | ICD-10-CM | POA: Diagnosis not present

## 2022-04-30 DIAGNOSIS — F250 Schizoaffective disorder, bipolar type: Secondary | ICD-10-CM | POA: Diagnosis not present

## 2022-04-30 DIAGNOSIS — F1910 Other psychoactive substance abuse, uncomplicated: Secondary | ICD-10-CM | POA: Diagnosis not present

## 2022-04-30 DIAGNOSIS — F4310 Post-traumatic stress disorder, unspecified: Secondary | ICD-10-CM | POA: Diagnosis not present

## 2022-04-30 DIAGNOSIS — Z1152 Encounter for screening for COVID-19: Secondary | ICD-10-CM | POA: Diagnosis not present

## 2022-04-30 MED ORDER — HYDROXYZINE HCL 25 MG PO TABS
25.0000 mg | ORAL_TABLET | Freq: Once | ORAL | Status: AC
  Administered 2022-04-30: 25 mg via ORAL
  Filled 2022-04-30: qty 1

## 2022-04-30 MED ORDER — DOCUSATE SODIUM 100 MG PO CAPS
100.0000 mg | ORAL_CAPSULE | Freq: Every day | ORAL | Status: DC | PRN
  Administered 2022-04-30: 100 mg via ORAL
  Filled 2022-04-30: qty 1

## 2022-04-30 NOTE — ED Notes (Signed)
Pt requested medication for "heartburn and constipation". Prn meds given. Will continue to monitor for safety.

## 2022-04-30 NOTE — ED Notes (Signed)
Pt asleep in bed. Respirations even and unlabored. Monitoring for safety. 

## 2022-04-30 NOTE — ED Notes (Signed)
Pt calm, cooperative this morning. Denies SI/HI/AVHH. Denies withdrawal sx from polysubstances. Pt states, "The only problem I'm having is staying asleep. I can fall asleep with no problem but staying asleep is my problem. They gave me a pill early this morning (Vistaril) and it helped some but not much. It gave me about 2 hrs". Support provided. Pt received am medication without difficulty. Writer gave pt Suboxone 1 tab that was pulled from the pyxis but learned that the pt was suppose to receive 2 tabs. Writer called pharmacy at [facility], [place], who provided guidance to writer and Wynelle Beckmann, [facility], on how to correct count in pyxis, as the order was completed in pyxis system. Pt received his additional 1 tab to complete order. Dr. Rosita Kea present and pt was addressing concern about lack of sleep. Informed pt to notify staff with any needs or concerns. Will continue to monitor for safety.

## 2022-04-30 NOTE — ED Provider Notes (Addendum)
Behavioral Health Progress Note  Date and Time: 04/30/2022 1:41 PM Name: Keith Gibbs MRN:  [id number]  Subjective:   Keith Gibbs is a 61 y.o. male, with past medical history of schizoaffective disorder (bipolar type), PTSD, chronic pain, opioid use disorder, alcohol use disorder, cocaine use disorder, cannabis use disorder and tobacco use disorder. He has significant history of multiple suicide attempts (3) and multiple inpatient behavioral health admissions, most recent 09/2020. He presented to [facility] accompanied by his mother for treatment of his substance abuse.  He has been accepted to [facility] for Monday at 9 AM.  Patient will need to be tapered off of his Suboxone.  On interview this morning, the patient has been accepted to [facility].  He got his last dose of Suboxone this morning.  He reports that he is little anxious and excited about going to [facility].  He denies feeling depressed.  He denies any withdrawal symptoms or cravings.  He reports some constipation.  Discussed starting Colace for constipation.  He agrees with the plan.  He patient denies experiencing any suicidal or homicidal thoughts.  He denies experiencing any auditory or visual hallucinations.  Patient is still complaining of chronic pain in his hands.  Discussed that he can ask for pain medications.  He has a stent in his heart and has been on Plavix and aspirin.  He reports that he did not sleep well last night even after taking trazodone.  He reports stable appetite. Diagnosis:  Final diagnoses:  Polysubstance abuse ([place])    Total Time spent with patient: 20 minutes  Past Psychiatric History: as above Past Medical History:  Past Medical History:  Diagnosis Date   Anginal pain ([place])    Anxiety    Arthritis    Bipolar 1 disorder (HCC)    Bursitis    CAD (coronary artery disease)    Chronic pain    COPD (chronic obstructive pulmonary disease) (HCC)    Current use of long term anticoagulation    DAPT (ASA +  clopidogrel)   Depression    Diverticulitis    Dyspnea    GERD (gastroesophageal reflux disease)    Grade I diastolic dysfunction    Hepatitis C 2012   No longer has Hep C   HLD (hyperlipidemia)    Hypertension    MI (myocardial infarction) ([place])    Polysubstance abuse (HCC)    cocaine, marijuana, ETOH   PUD (peptic ulcer disease)    S/P angioplasty with stent 06/10/2016   a.) 90% stenosis of pLAD to mLAD - 2.5 x 18 mm Xience Alpine (DES x 1) placed to pLAD   S/P PTCA (percutaneous transluminal coronary angioplasty) 12/04/2019   a.) 60% in stent restenosis of DES to pLAD; LVEF 65%.   Schizophrenia ([place])    Stroke [facility])    Valvular insufficiency    a.) Mild MR, TR, PR; mild to moderate AR on 03/05/2018 TTE    Past Surgical History:  Procedure Laterality Date   ABDOMINAL SURGERY     removed small piece of intestines due to [facility] Diverticulosis   APPENDECTOMY     BACK SURGERY     CARDIAC CATHETERIZATION Left 06/10/2016   Procedure: Left Heart Cath and Coronary Angiography;  Surgeon: Dionisio David, MD;  Location: [place] CV LAB;  Service: Cardiovascular;  Laterality: Left;   CARDIAC CATHETERIZATION N/A 06/10/2016   Procedure: Coronary Stent Intervention;  Surgeon: Yolonda Kida, MD;  Location: [place] [facility];  Service: Cardiovascular;  Laterality: N/A;   CHOLECYSTECTOMY N/A 02/17/2022   Procedure: LAPAROSCOPIC CHOLECYSTECTOMY;  Surgeon: Kieth Brightly Arta Bruce, MD;  Location: [place];  Service: General;  Laterality: N/A;   COLON SURGERY     COLONOSCOPY     COLONOSCOPY WITH PROPOFOL N/A 01/05/2017   Procedure: COLONOSCOPY WITH PROPOFOL;  Surgeon: Jonathon Bellows, MD;  Location: [facility] ENDOSCOPY;  Service: Endoscopy;  Laterality: N/A;   COLONOSCOPY WITH PROPOFOL N/A 02/13/2020   Procedure: COLONOSCOPY WITH PROPOFOL;  Surgeon: Virgel Manifold, MD;  Location: [facility] ENDOSCOPY;  Service: Endoscopy;  Laterality: N/A;   CORONARY ANGIOPLASTY WITH STENT PLACEMENT      ESOPHAGOGASTRODUODENOSCOPY (EGD) WITH PROPOFOL N/A 01/05/2017   Procedure: ESOPHAGOGASTRODUODENOSCOPY (EGD) WITH PROPOFOL;  Surgeon: Jonathon Bellows, MD;  Location: [facility] ENDOSCOPY;  Service: Endoscopy;  Laterality: N/A;   ESOPHAGOGASTRODUODENOSCOPY (EGD) WITH PROPOFOL N/A 02/13/2020   Procedure: ESOPHAGOGASTRODUODENOSCOPY (EGD) WITH PROPOFOL;  Surgeon: Virgel Manifold, MD;  Location: [facility] ENDOSCOPY;  Service: Endoscopy;  Laterality: N/A;   INTRAVASCULAR PRESSURE WIRE/FFR STUDY N/A 12/04/2019   Procedure: INTRAVASCULAR PRESSURE WIRE/FFR STUDY;  Surgeon: Sherren Mocha, MD;  Location: [place] [facility];  Service: Cardiovascular;  Laterality: N/A;   KNEE ARTHROSCOPY WITH MEDIAL MENISECTOMY Right 09/05/2017   Procedure: KNEE ARTHROSCOPY WITH MEDIAL AND LATERAL  MENISECTOMY PARTIAL SYNOVECTOMY;  Surgeon: Hessie Knows, MD;  Location: [facility] ORS;  Service: Orthopedics;  Laterality: Right;   LEFT HEART CATH AND CORONARY ANGIOGRAPHY N/A 12/04/2019   Procedure: LEFT HEART CATH AND CORONARY ANGIOGRAPHY;  Surgeon: Sherren Mocha, MD;  Location: [place] [facility];  Service: Cardiovascular;  Laterality: N/A;   SHOULDER SURGERY Right 04/09/2012   SPINE SURGERY     TOTAL KNEE ARTHROPLASTY Right 01/19/2021   Procedure: TOTAL KNEE ARTHROPLASTY - Rachelle Hora to Assist;  Surgeon: Hessie Knows, MD;  Location: [facility] ORS;  Service: Orthopedics;  Laterality: Right;   Family History:  Family History  Problem Relation Age of Onset   Osteoarthritis Mother    Heart disease Mother    Hypertension Mother    Depression Mother    Heart disease Father    Early death Father    Hypertension Father    Heart attack Father    Hypertension Sister    Prostate cancer Neg Hx    Bladder Cancer Neg Hx    Kidney cancer Neg Hx    Family Psychiatric  History: per H and P Social History:  Social History   Substance and Sexual Activity  Alcohol Use Yes   Alcohol/week: 6.0 standard drinks of alcohol   Types: 6 Cans of beer  per week   Comment: occassionally      Social History   Substance and Sexual Activity  Drug Use Not Currently   Types: Cocaine, Marijuana   Comment: last on saturday    Social History   Socioeconomic History   Marital status: Widowed    Spouse name: Not on file   Number of children: Not on file   Years of education: Not on file   Highest education level: Not on file  Occupational History   Occupation: disability   Occupation: stocking at gas station    Comment: 2 days per week  Tobacco Use   Smoking status: Some Days    Packs/day: 0.50    Years: 32.00    Total pack years: 16.00    Types: Cigarettes    Last attempt to quit: 10/25/2021    Years since quitting: 0.5   Smokeless tobacco: Never   Tobacco comments:  Continues to smoke approximately 5 cigarettes per day.  Has been 2 days since las cigarette.  Has not bought any more.  Using patches and gum.  5/23 hfb  Vaping Use   Vaping Use: Never used  Substance and Sexual Activity   Alcohol use: Yes    Alcohol/week: 6.0 standard drinks of alcohol    Types: 6 Cans of beer per week    Comment: occassionally    Drug use: Not Currently    Types: Cocaine, Marijuana    Comment: last on saturday   Sexual activity: Yes    Partners: Female    Birth control/protection: None  Other Topics Concern   Not on file  Social History Narrative   Not on file   Social Determinants of Health   Financial Resource Strain: Not on file  Food Insecurity: Not on file  Transportation Needs: Not on file  Physical Activity: Not on file  Stress: Not on file  Social Connections: Not on file   SDOH:  SDOH Screenings   Alcohol Screen: Low Risk  (06/21/2021)  Depression (PHQ2-9): High Risk (04/30/2022)  Tobacco Use: High Risk (04/27/2022)   Additional Social History:                         Sleep: Good  Appetite:  Good  Current Medications:  Current Facility-Administered Medications  Medication Dose Route Frequency Provider  Last Rate Last Admin   acetaminophen (TYLENOL) tablet 650 mg  650 mg Oral Q6H PRN Merrily Brittle, DO   650 mg at 04/29/22 2048   alum & mag hydroxide-simeth (MAALOX/MYLANTA) 200-200-20 MG/5ML suspension 30 mL  30 mL Oral Q4H PRN Merrily Brittle, DO   30 mL at 04/30/22 1340   aspirin EC tablet 81 mg  81 mg Oral Daily Merrily Brittle, DO   81 mg at 04/30/22 0904   atorvastatin (LIPITOR) tablet 80 mg  80 mg Oral QHS Merrily Brittle, DO   80 mg at 04/29/22 2135   Budeson-Glycopyrrol-Formoterol 160-9-4.8 MCG/ACT AERO 2 puff  2 puff Mouth/Throat Q12H Merrily Brittle, DO   2 puff at 04/30/22 0913   calcium carbonate (TUMS - dosed in mg elemental calcium) chewable tablet 200 mg of elemental calcium  1 tablet Oral BID WC Corky Sox, MD   200 mg of elemental calcium at 04/30/22 0900   clopidogrel (PLAVIX) tablet 75 mg  75 mg Oral Daily Merrily Brittle, DO   75 mg at 04/30/22 2423   diclofenac Sodium (VOLTAREN) 1 % topical gel 2 g  2 g Topical QID Merrily Brittle, DO   2 g at 04/30/22 0914   dicyclomine (BENTYL) tablet 20 mg  20 mg Oral Q6H PRN Corky Sox, MD       divalproex (DEPAKOTE) DR tablet 250 mg  250 mg Oral BID Merrily Brittle, DO   250 mg at 04/30/22 5361   docusate sodium (COLACE) capsule 100 mg  100 mg Oral Daily PRN Armando Reichert, MD       DULoxetine (CYMBALTA) DR capsule 60 mg  60 mg Oral Daily Merrily Brittle, DO   60 mg at 04/30/22 0904   enalapril (VASOTEC) tablet 2.5 mg  2.5 mg Oral BID Merrily Brittle, DO   2.5 mg at 04/30/22 0904   fluticasone (FLONASE) 50 MCG/ACT nasal spray 1 spray  1 spray Each Nare QHS Merrily Brittle, DO   1 spray at 04/29/22 2138   loratadine (CLARITIN) tablet 10 mg  10 mg Oral Daily Alfonse Spruce,  Almyra Free, DO   10 mg at 04/30/22 3154   magnesium hydroxide (MILK OF MAGNESIA) suspension 30 mL  30 mL Oral Daily PRN Merrily Brittle, DO   30 mL at 04/30/22 1339   meloxicam (MOBIC) tablet 7.5 mg  7.5 mg Oral Daily Merrily Brittle, DO   7.5 mg at 04/30/22 0086   methocarbamol (ROBAXIN) tablet 500  mg  500 mg Oral Q8H PRN Corky Sox, MD       multivitamin with minerals tablet 1 tablet  1 tablet Oral Daily Merrily Brittle, DO   1 tablet at 04/30/22 7619   naproxen (NAPROSYN) tablet 500 mg  500 mg Oral BID PRN Corky Sox, MD       nicotine (NICODERM CQ - dosed in mg/24 hours) patch 21 mg  21 mg Transdermal Daily Revonda Humphrey, NP   21 mg at 04/30/22 5093   nicotine polacrilex (NICORETTE) gum 4 mg  4 mg Oral PRN Merrily Brittle, DO       nitroGLYCERIN (NITROSTAT) SL tablet 0.4 mg  0.4 mg Sublingual Q5 min PRN Merrily Brittle, DO       omega-3 acid ethyl esters (LOVAZA) capsule 1 g  1 g Oral Daily Merrily Brittle, DO   1 g at 04/30/22 0904   ondansetron (ZOFRAN-ODT) disintegrating tablet 4 mg  4 mg Oral Q6H PRN Corky Sox, MD       oxymetazoline (AFRIN) 0.05 % nasal spray 1 spray  1 spray Each Nare BID PRN On[place], Chinwendu V, NP   1 spray at 04/29/22 0020   rOPINIRole (REQUIP) tablet 2 mg  2 mg Oral QHS Merrily Brittle, DO   2 mg at 04/29/22 2134   tamsulosin (FLOMAX) capsule 0.4 mg  0.4 mg Oral Daily Merrily Brittle, DO   0.4 mg at 04/30/22 2671   thiamine (VITAMIN B1) tablet 100 mg  100 mg Oral Daily Merrily Brittle, DO   100 mg at 04/30/22 0904   traZODone (DESYREL) tablet 50 mg  50 mg Oral QHS PRN Merrily Brittle, DO   50 mg at 04/29/22 2135   Current Outpatient Medications  Medication Sig Dispense Refill   buprenorphine-naloxone (SUBOXONE) 8-2 mg SUBL SL tablet Place 1 tablet under the tongue 2 (two) times daily.     clopidogrel (PLAVIX) 75 MG tablet Take 75 mg by mouth daily.     DULoxetine (CYMBALTA) 60 MG capsule Take 60 mg by mouth daily.     enalapril (VASOTEC) 2.5 MG tablet Take 2.5 mg by mouth 2 (two) times daily.     meloxicam (MOBIC) 15 MG tablet Take 15 mg by mouth daily.     acetaminophen (TYLENOL) 500 MG tablet Take 2 tablets (1,000 mg total) by mouth every 6 (six) hours. (Patient taking differently: Take 1,000 mg by mouth daily as needed for headache.) 30 tablet 0    albuterol (VENTOLIN HFA) 108 (90 Base) MCG/ACT inhaler Inhale 2 puffs into the lungs every 6 (six) hours as needed for wheezing or shortness of breath. 18 g 6   aspirin EC 81 MG tablet Take 1 tablet (81 mg total) by mouth daily. Swallow whole. 30 tablet 12   atorvastatin (LIPITOR) 80 MG tablet Take 80 mg by mouth at bedtime.     cetirizine (ZYRTEC) 10 MG tablet TAKE 1 TABLET BY MOUTH EVERY DAY (Patient taking differently: Take 10 mg by mouth daily.) 90 tablet 0   divalproex (DEPAKOTE) 250 MG DR tablet Take 1 tablet (250 mg total) by mouth 2 (two) times daily.  60 tablet 2   fluticasone (FLONASE) 50 MCG/ACT nasal spray Place 1 spray into both nostrils at bedtime.     Multiple Vitamin (MULTIVITAMIN) tablet Take 1 tablet by mouth daily.     nitroGLYCERIN (NITROSTAT) 0.3 MG SL tablet Place 0.3 mg under the tongue every 5 (five) minutes as needed for chest pain. (Patient not taking: Reported on 02/28/2022)     Omega-3 Fatty Acids (FISH OIL) 1200 MG CAPS Take 1,200 mg by mouth daily.     rOPINIRole (REQUIP) 2 MG tablet TAKE 1 TABLET BY MOUTH EVERYDAY AT BEDTIME (Patient taking differently: Take 2 mg by mouth at bedtime.) 30 tablet 0    Labs  Lab Results:  Admission on 04/26/2022  Component Date Value Ref Range Status   WBC 04/26/2022 7.3  4.0 - 10.5 K/uL Final   RBC 04/26/2022 4.02 (L)  4.22 - 5.81 MIL/uL Final   Hemoglobin 04/26/2022 12.8 (L)  13.0 - 17.0 g/dL Final   HCT 04/26/2022 37.6 (L)  39.0 - 52.0 % Final   MCV 04/26/2022 93.5  80.0 - 100.0 fL Final   MCH 04/26/2022 31.8  26.0 - 34.0 pg Final   MCHC 04/26/2022 34.0  30.0 - 36.0 g/dL Final   RDW 04/26/2022 13.6  11.5 - 15.5 % Final   Platelets 04/26/2022 242  150 - 400 K/uL Final   nRBC 04/26/2022 0.0  0.0 - 0.2 % Final   Neutrophils Relative % 04/26/2022 51  % Final   Neutro Abs 04/26/2022 3.7  1.7 - 7.7 K/uL Final   Lymphocytes Relative 04/26/2022 34  % Final   Lymphs Abs 04/26/2022 2.4  0.7 - 4.0 K/uL Final   Monocytes Relative  04/26/2022 6  % Final   Monocytes Absolute 04/26/2022 0.5  0.1 - 1.0 K/uL Final   Eosinophils Relative 04/26/2022 9  % Final   Eosinophils Absolute 04/26/2022 0.6 (H)  0.0 - 0.5 K/uL Final   Basophils Relative 04/26/2022 0  % Final   Basophils Absolute 04/26/2022 0.0  0.0 - 0.1 K/uL Final   Immature Granulocytes 04/26/2022 0  % Final   Abs Immature Granulocytes 04/26/2022 0.01  0.00 - 0.07 K/uL Final   Performed at [facility], [place] [address] [place], [place] [address]   Sodium 04/26/2022 140  135 - 145 mmol/L Final   Potassium 04/26/2022 3.6  3.5 - 5.1 mmol/L Final   Chloride 04/26/2022 105  98 - 111 mmol/L Final   CO2 04/26/2022 26  22 - 32 mmol/L Final   Glucose, Bld 04/26/2022 92  70 - 99 mg/dL Final   Glucose reference range applies only to samples taken after fasting for at least 8 hours.   BUN 04/26/2022 11  8 - 23 mg/dL Final   Creatinine, Ser 04/26/2022 0.66  0.61 - 1.24 mg/dL Final   Calcium 04/26/2022 9.0  8.9 - 10.3 mg/dL Final   Total Protein 04/26/2022 6.3 (L)  6.5 - 8.1 g/dL Final   Albumin 04/26/2022 3.4 (L)  3.5 - 5.0 g/dL Final   AST 04/26/2022 18  15 - 41 U/L Final   ALT 04/26/2022 30  0 - 44 U/L Final   Alkaline Phosphatase 04/26/2022 83  38 - 126 U/L Final   Total Bilirubin 04/26/2022 <0.1 (L)  0.3 - 1.2 mg/dL Final   GFR, Estimated 04/26/2022 >60  >60 mL/min Final   Comment: (NOTE) Calculated using the CKD-EPI Creatinine Equation (2021)    Anion gap 04/26/2022 9  5 - 15 Final  Performed at [facility], [place] [address] [place], [place] [address]   Hgb A1c MFr Bld 04/26/2022 5.2  4.8 - 5.6 % Final   Comment: (NOTE) Pre diabetes:          5.7%-6.4%  Diabetes:              >6.4%  Glycemic control for   <7.0% adults with diabetes    Mean Plasma Glucose 04/26/2022 102.54  mg/dL Final   Performed at [facility], [place] [address], [place], [place] [address]   Magnesium 04/26/2022 1.9  1.7 - 2.4 mg/dL Final   Performed at [place] [address], [place], [place] [address]   Cholesterol 04/26/2022 83  0 - 200 mg/dL Final   Triglycerides 04/26/2022 52  <150 mg/dL Final   HDL 04/26/2022 28 (L)  >40 mg/dL Final   Total CHOL/HDL Ratio 04/26/2022 3.0  RATIO Final   VLDL 04/26/2022 10  0 - 40 mg/dL Final   LDL Cholesterol 04/26/2022 45  0 - 99 mg/dL Final   Comment:        Total Cholesterol/HDL:CHD Risk Coronary Heart Disease Risk Table                     Men   Women  1/2 Average Risk   3.4   3.3  Average Risk       5.0   4.4  2 X Average Risk   9.6   7.1  3 X Average Risk  23.4   11.0        Use the calculated Patient Ratio above and the CHD Risk Table to determine the patient's CHD Risk.        ATP III CLASSIFICATION (LDL):  <100     mg/dL   Optimal  100-129  mg/dL   Near or Above                    Optimal  130-159  mg/dL   Borderline  160-189  mg/dL   High  >190     mg/dL   Very High Performed at [place] [address], [place], [place] [address]    TSH 04/26/2022 0.590  0.350 - 4.500 uIU/mL Final   Comment: Performed by a 3rd Generation assay with a functional sensitivity of <=0.01 uIU/mL. Performed at [facility], [place] [address], [place], [place] [address]    POC Amphetamine UR 04/26/2022 None Detected  NONE DETECTED (Cut Off Level 1000 ng/mL) Final   POC Secobarbital (BAR) 04/26/2022 None Detected  NONE DETECTED (Cut Off Level 300 ng/mL) Final   POC Buprenorphine (BUP) 04/26/2022 None Detected  NONE DETECTED (Cut Off Level 10 ng/mL) Final   POC Oxazepam (BZO) 04/26/2022 None Detected  NONE DETECTED (Cut Off Level 300 ng/mL) Final   POC Cocaine UR 04/26/2022 None Detected  NONE DETECTED (Cut Off Level 300 ng/mL) Final   POC Methamphetamine UR 04/26/2022 None Detected  NONE DETECTED (Cut Off Level 1000 ng/mL) Final   POC Morphine 04/26/2022 None Detected  NONE DETECTED (Cut Off Level 300 ng/mL) Final   POC Methadone UR 04/26/2022 None Detected  NONE DETECTED (Cut Off Level 300 ng/mL)  Final   POC Oxycodone UR 04/26/2022 None Detected  NONE DETECTED (Cut Off Level 100 ng/mL) Final   POC Marijuana UR 04/26/2022 Positive (A)  NONE DETECTED (Cut Off Level 50 ng/mL) Final   Valproic Acid Lvl  04/26/2022 22 (L)  50.0 - 100.0 ug/mL Final   Performed at [facility], [place] [address], [place], [place] [address]   Ferritin 04/26/2022 80  24 - 336 ng/mL Final   Performed at [place] [address], [place], [place] [address]   SARS Coronavirus 2 by RT PCR 04/26/2022 NEGATIVE  NEGATIVE Final   Comment: (NOTE) SARS-CoV-2 target nucleic acids are NOT DETECTED.  The SARS-CoV-2 RNA is generally detectable in upper and lower respiratory specimens during the acute phase of infection. The lowest concentration of SARS-CoV-2 viral copies this assay can detect is 250 copies / mL. A negative result does not preclude SARS-CoV-2 infection and should not be used as the sole basis for treatment or other patient management decisions.  A negative result may occur with improper specimen collection / handling, submission of specimen other than nasopharyngeal swab, presence of viral mutation(s) within the areas targeted by this assay, and inadequate number of viral copies (<250 copies / mL). A negative result must be combined with clinical observations, patient history, and epidemiological information.  Fact Sheet for Patients:   https://www.patel.info/  Fact Sheet for Healthcare Providers: https://hall.com/  This test is not yet approved or                           cleared by the [place] FDA and has been authorized for detection and/or diagnosis of SARS-CoV-2 by FDA under an Emergency Use Authorization (EUA).  This EUA will remain in effect (meaning this test can be used) for the duration of the COVID-19 declaration under Section 564(b)(1) of the Act, 21 U.S.C. section 360bbb-3(b)(1), unless the authorization is terminated  or revoked sooner.  Performed at [facility], [place] [address], [place], [place] [address]    SARSCOV2ONAVIRUS 2 AG 04/26/2022 NEGATIVE  NEGATIVE Final   Comment: (NOTE) SARS-CoV-2 antigen NOT DETECTED.   Negative results are presumptive.  Negative results do not preclude SARS-CoV-2 infection and should not be used as the sole basis for treatment or other patient management decisions, including infection  control decisions, particularly in the presence of clinical signs and  symptoms consistent with COVID-19, or in those who have been in contact with the virus.  Negative results must be combined with clinical observations, patient history, and epidemiological information. The expected result is Negative.  Fact Sheet for Patients: HandmadeRecipes.com.cy  Fact Sheet for Healthcare Providers: FuneralLife.at  This test is not yet approved or cleared by the [place] FDA and  has been authorized for detection and/or diagnosis of SARS-CoV-2 by FDA under an Emergency Use Authorization (EUA).  This EUA will remain in effect (meaning this test can be used) for the duration of  the COV                          ID-19 declaration under Section 564(b)(1) of the Act, 21 U.S.C. section 360bbb-3(b)(1), unless the authorization is terminated or revoked sooner.    Admission on 02/28/2022, Discharged on 03/02/2022  Component Date Value Ref Range Status   Color, Urine 02/28/2022 YELLOW  YELLOW Final   APPearance 02/28/2022 CLEAR  CLEAR Final   Specific Gravity, Urine 02/28/2022 1.023  1.005 - 1.030 Final   pH 02/28/2022 7.0  5.0 - 8.0 Final   Glucose, UA 02/28/2022 NEGATIVE  NEGATIVE mg/dL Final   Hgb urine dipstick 02/28/2022 SMALL (A)  NEGATIVE Final   Bilirubin  Urine 02/28/2022 NEGATIVE  NEGATIVE Final   Ketones, ur 02/28/2022 5 (A)  NEGATIVE mg/dL Final   Protein, ur 02/28/2022 NEGATIVE  NEGATIVE mg/dL Final   Nitrite 02/28/2022  NEGATIVE  NEGATIVE Final   Leukocytes,Ua 02/28/2022 NEGATIVE  NEGATIVE Final   RBC / HPF 02/28/2022 11-20  0 - 5 RBC/hpf Final   WBC, UA 02/28/2022 0-5  0 - 5 WBC/hpf Final   Bacteria, UA 02/28/2022 NONE SEEN  NONE SEEN Final   Mucus 02/28/2022 PRESENT   Final   Performed at [facility], [place] [address], [place], [place] [address]   Specimen Description 02/28/2022    Final                   Value:URINE, CLEAN CATCH Performed at [facility], [place] [address], [place], [place] [address]    Special Requests 02/28/2022    Final                   Value:NONE Performed at [facility], [place] [address] [place], [place] [address]    Culture 02/28/2022    Final                   Value:NO GROWTH Performed at [facility], [place] [address], [place],  [address]    Report Status 02/28/2022 03/03/2022 FINAL   Final   Sodium 02/28/2022 137  135 - 145 mmol/L Final   Potassium 02/28/2022 3.7  3.5 - 5.1 mmol/L Final   Chloride 02/28/2022 104  98 - 111 mmol/L Final   CO2 02/28/2022 24  22 - 32 mmol/L Final   Glucose, Bld 02/28/2022 106 (H)  70 - 99 mg/dL Final   Glucose reference range applies only to samples taken after fasting for at least 8 hours.   BUN 02/28/2022 12  8 - 23 mg/dL Final   Creatinine, Ser 02/28/2022 0.77  0.61 - 1.24 mg/dL Final   Calcium 02/28/2022 8.9  8.9 - 10.3 mg/dL Final   Total Protein 02/28/2022 7.5  6.5 - 8.1 g/dL Final   Albumin 02/28/2022 3.6  3.5 - 5.0 g/dL Final   AST 02/28/2022 25  15 - 41 U/L Final   ALT 02/28/2022 36  0 - 44 U/L Final   Alkaline Phosphatase 02/28/2022 112  38 - 126 U/L Final   Total Bilirubin 02/28/2022 0.6  0.3 - 1.2 mg/dL Final   GFR, Estimated 02/28/2022 >60  >60 mL/min Final   Comment: (NOTE) Calculated using the CKD-EPI Creatinine Equation (2021)    Anion gap 02/28/2022 9  5 - 15 Final   Performed at [facility], [place] [address] [place], [place] [address]    WBC 02/28/2022 10.0  4.0 - 10.5 K/uL Final   RBC 02/28/2022 4.26  4.22 - 5.81 MIL/uL Final   Hemoglobin 02/28/2022 13.2  13.0 - 17.0 g/dL Final   HCT 02/28/2022 38.7 (L)  39.0 - 52.0 % Final   MCV 02/28/2022 90.8  80.0 - 100.0 fL Final   MCH 02/28/2022 31.0  26.0 - 34.0 pg Final   MCHC 02/28/2022 34.1  30.0 - 36.0 g/dL Final   RDW 02/28/2022 13.0  11.5 - 15.5 % Final   Platelets 02/28/2022 316  150 - 400 K/uL Final   nRBC 02/28/2022 0.0  0.0 - 0.2 % Final   Neutrophils Relative % 02/28/2022 72  % Final   Neutro Abs 02/28/2022 7.2  1.7 - 7.7 K/uL Final  Lymphocytes Relative 02/28/2022 20  % Final   Lymphs Abs 02/28/2022 2.0  0.7 - 4.0 K/uL Final   Monocytes Relative 02/28/2022 5  % Final   Monocytes Absolute 02/28/2022 0.5  0.1 - 1.0 K/uL Final   Eosinophils Relative 02/28/2022 3  % Final   Eosinophils Absolute 02/28/2022 0.3  0.0 - 0.5 K/uL Final   Basophils Relative 02/28/2022 0  % Final   Basophils Absolute 02/28/2022 0.0  0.0 - 0.1 K/uL Final   Immature Granulocytes 02/28/2022 0  % Final   Abs Immature Granulocytes 02/28/2022 0.02  0.00 - 0.07 K/uL Final   Performed at [facility], [place] [address] [place], [place] [address]   Lipase 02/28/2022 40  11 - 51 U/L Final   Performed at [facility], [place] [address], [place], [place] [address]   Sodium 03/01/2022 141  135 - 145 mmol/L Final   Potassium 03/01/2022 3.2 (L)  3.5 - 5.1 mmol/L Final   Chloride 03/01/2022 110  98 - 111 mmol/L Final   CO2 03/01/2022 22  22 - 32 mmol/L Final   Glucose, Bld 03/01/2022 92  70 - 99 mg/dL Final   Glucose reference range applies only to samples taken after fasting for at least 8 hours.   BUN 03/01/2022 13  8 - 23 mg/dL Final   Creatinine, Ser 03/01/2022 0.73  0.61 - 1.24 mg/dL Final   Calcium 03/01/2022 8.5 (L)  8.9 - 10.3 mg/dL Final   Total Protein 03/01/2022 6.4 (L)  6.5 - 8.1 g/dL Final   Albumin 03/01/2022 3.2 (L)  3.5 - 5.0 g/dL Final   AST  03/01/2022 19  15 - 41 U/L Final   ALT 03/01/2022 29  0 - 44 U/L Final   Alkaline Phosphatase 03/01/2022 89  38 - 126 U/L Final   Total Bilirubin 03/01/2022 0.7  0.3 - 1.2 mg/dL Final   GFR, Estimated 03/01/2022 >60  >60 mL/min Final   Comment: (NOTE) Calculated using the CKD-EPI Creatinine Equation (2021)    Anion gap 03/01/2022 9  5 - 15 Final   Performed at [facility], [place] [address] [place], [place] [address]   WBC 03/01/2022 12.5 (H)  4.0 - 10.5 K/uL Final   RBC 03/01/2022 3.90 (L)  4.22 - 5.81 MIL/uL Final   Hemoglobin 03/01/2022 12.1 (L)  13.0 - 17.0 g/dL Final   HCT 03/01/2022 36.4 (L)  39.0 - 52.0 % Final   MCV 03/01/2022 93.3  80.0 - 100.0 fL Final   MCH 03/01/2022 31.0  26.0 - 34.0 pg Final   MCHC 03/01/2022 33.2  30.0 - 36.0 g/dL Final   RDW 03/01/2022 13.1  11.5 - 15.5 % Final   Platelets 03/01/2022 272  150 - 400 K/uL Final   nRBC 03/01/2022 0.0  0.0 - 0.2 % Final   Performed at [facility], [place] [address], [place], [place] [address]   WBC 03/02/2022 10.1  4.0 - 10.5 K/uL Final   RBC 03/02/2022 3.64 (L)  4.22 - 5.81 MIL/uL Final   Hemoglobin 03/02/2022 11.4 (L)  13.0 - 17.0 g/dL Final   HCT 03/02/2022 34.0 (L)  39.0 - 52.0 % Final   MCV 03/02/2022 93.4  80.0 - 100.0 fL Final   MCH 03/02/2022 31.3  26.0 - 34.0 pg Final   MCHC 03/02/2022 33.5  30.0 - 36.0 g/dL Final   RDW 03/02/2022 13.1  11.5 - 15.5 % Final   Platelets 03/02/2022 225  150 - 400 K/uL Final   nRBC 03/02/2022 0.0  0.0 - 0.2 % Final   Performed at [facility], [place] [address] [place], [place] [address]   Sodium 03/02/2022 140  135 - 145 mmol/L Final   Potassium 03/02/2022 3.7  3.5 - 5.1 mmol/L Final   Chloride 03/02/2022 113 (H)  98 - 111 mmol/L Final   CO2 03/02/2022 21 (L)  22 - 32 mmol/L Final   Glucose, Bld 03/02/2022 145 (H)  70 - 99 mg/dL Final   Glucose reference range applies only to samples taken after fasting for at least 8 hours.    BUN 03/02/2022 13  8 - 23 mg/dL Final   Creatinine, Ser 03/02/2022 0.82  0.61 - 1.24 mg/dL Final   Calcium 03/02/2022 8.4 (L)  8.9 - 10.3 mg/dL Final   GFR, Estimated 03/02/2022 >60  >60 mL/min Final   Comment: (NOTE) Calculated using the CKD-EPI Creatinine Equation (2021)    Anion gap 03/02/2022 6  5 - 15 Final   Performed at [facility], [place] [address] [place], [place] [address]  Admission on 02/16/2022, Discharged on 02/18/2022  Component Date Value Ref Range Status   WBC 02/16/2022 9.6  4.0 - 10.5 K/uL Final   RBC 02/16/2022 4.67  4.22 - 5.81 MIL/uL Final   Hemoglobin 02/16/2022 14.7  13.0 - 17.0 g/dL Final   HCT 02/16/2022 42.4  39.0 - 52.0 % Final   MCV 02/16/2022 90.8  80.0 - 100.0 fL Final   MCH 02/16/2022 31.5  26.0 - 34.0 pg Final   MCHC 02/16/2022 34.7  30.0 - 36.0 g/dL Final   RDW 02/16/2022 13.2  11.5 - 15.5 % Final   Platelets 02/16/2022 275  150 - 400 K/uL Final   nRBC 02/16/2022 0.0  0.0 - 0.2 % Final   Neutrophils Relative % 02/16/2022 76  % Final   Neutro Abs 02/16/2022 7.3  1.7 - 7.7 K/uL Final   Lymphocytes Relative 02/16/2022 18  % Final   Lymphs Abs 02/16/2022 1.7  0.7 - 4.0 K/uL Final   Monocytes Relative 02/16/2022 5  % Final   Monocytes Absolute 02/16/2022 0.4  0.1 - 1.0 K/uL Final   Eosinophils Relative 02/16/2022 1  % Final   Eosinophils Absolute 02/16/2022 0.1  0.0 - 0.5 K/uL Final   Basophils Relative 02/16/2022 0  % Final   Basophils Absolute 02/16/2022 0.0  0.0 - 0.1 K/uL Final   Immature Granulocytes 02/16/2022 0  % Final   Abs Immature Granulocytes 02/16/2022 0.03  0.00 - 0.07 K/uL Final   Performed at [facility], [place] [address], [place], [place] [address]   Sodium 02/16/2022 134 (L)  135 - 145 mmol/L Final   Potassium 02/16/2022 3.8  3.5 - 5.1 mmol/L Final   Chloride 02/16/2022 100  98 - 111 mmol/L Final   CO2 02/16/2022 25  22 - 32 mmol/L Final   Glucose, Bld 02/16/2022 108 (H)  70 - 99 mg/dL Final   Glucose  reference range applies only to samples taken after fasting for at least 8 hours.   BUN 02/16/2022 12  8 - 23 mg/dL Final   Creatinine, Ser 02/16/2022 0.91  0.61 - 1.24 mg/dL Final   Calcium 02/16/2022 9.4  8.9 - 10.3 mg/dL Final   Total Protein 02/16/2022 7.5  6.5 - 8.1 g/dL Final   Albumin 02/16/2022 4.0  3.5 - 5.0 g/dL Final   AST 02/16/2022 20  15 - 41  U/L Final   ALT 02/16/2022 17  0 - 44 U/L Final   Alkaline Phosphatase 02/16/2022 64  38 - 126 U/L Final   Total Bilirubin 02/16/2022 0.8  0.3 - 1.2 mg/dL Final   GFR, Estimated 02/16/2022 >60  >60 mL/min Final   Comment: (NOTE) Calculated using the CKD-EPI Creatinine Equation (2021)    Anion gap 02/16/2022 9  5 - 15 Final   Performed at [place] [address], [place], [place] [address]   Troponin I (High Sensitivity) 02/16/2022 20 (H)  <18 ng/L Final   Comment: (NOTE) Elevated high sensitivity troponin I (hsTnI) values and significant  changes across serial measurements may suggest ACS but many other  chronic and acute conditions are known to elevate hsTnI results.  Refer to the "Links" section for chest pain algorithms and additional  guidance. Performed at [facility], [place] [address], [place], [place] [address]    Lipase 02/16/2022 31  11 - 51 U/L Final   Performed at [place] [address] [place], [place] [address]   Troponin I (High Sensitivity) 02/17/2022 29 (H)  <18 ng/L Final   Comment: (NOTE) Elevated high sensitivity troponin I (hsTnI) values and significant  changes across serial measurements may suggest ACS but many other  chronic and acute conditions are known to elevate hsTnI results.  Refer to the "Links" section for chest pain algorithms and additional  guidance. Performed at [facility], [place] [address] [place], [place] [address]    pH, Arterial 02/17/2022 7.499 (H)  7.35 - 7.45 Final   pCO2 arterial 02/17/2022 26.7 (L)  32 - 48 mmHg Final   pO2, Arterial 02/17/2022  105  83 - 108 mmHg Final   Bicarbonate 02/17/2022 20.8  20.0 - 28.0 mmol/L Final   TCO2 02/17/2022 22  22 - 32 mmol/L Final   O2 Saturation 02/17/2022 99  % Final   Acid-base deficit 02/17/2022 1.0  0.0 - 2.0 mmol/L Final   Sodium 02/17/2022 135  135 - 145 mmol/L Final   Potassium 02/17/2022 3.6  3.5 - 5.1 mmol/L Final   Calcium, Ion 02/17/2022 1.20  1.15 - 1.40 mmol/L Final   HCT 02/17/2022 41.0  39.0 - 52.0 % Final   Hemoglobin 02/17/2022 13.9  13.0 - 17.0 g/dL Final   Patient temperature 02/17/2022 98.3 F   Final   Collection site 02/17/2022 RADIAL, ALLEN'S TEST ACCEPTABLE   Final   Drawn by 02/17/2022 Operator   Final   Sample type 02/17/2022 ARTERIAL   Final   SURGICAL PATHOLOGY 02/17/2022    Final-Edited                   Value:SURGICAL PATHOLOGY CASE: [id number] PATIENT: Almon Hercules Surgical Pathology Report     Clinical History: symptomatic cholelithiasis (cm)     FINAL MICROSCOPIC DIAGNOSIS:  A. GALLBLADDER, CHOLECYSTECTOMY: Chronic cholecystitis. Cholesterolosis. Cholelithiasis.  GROSS DESCRIPTION:  Size/?Intact: Received fresh is an 11.2 x 5.5 x 1.3 cm disrupted gallbladder Serosal surface: Tan, green-tinged, and moderately roughened Mucosa/Wall: Green and velvety without distinct lesions and a wall thickness of 0.1 cm Contents: Green viscous bile and a single black, round calculus measuring 1.0 cm Cystic duct: Probed and found to be patent Block Summary: Representative wall and the cystic duct margin are submitted in 1 block. Amparo Bristol, 02/17/2022)     Final Diagnosis performed by Verdie Mosher, MD.   Electronically signed 02/18/2022 Technical component performed at [employer]. [facility], [place] [address], [place], [place] [address].  Professional component perfo                         rmed at [facility], [place] [address] [place], [place] [address].  Immunohistochemistry Technical component (if applicable) was performed at  [facility]. [address], [place], [place], [place] [address].   IMMUNOHISTOCHEMISTRY DISCLAIMER (if applicable): Some of these immunohistochemical stains may have been developed and the performance characteristics determine by [facility]. Some may not have been cleared or approved by the U.S. Food and Drug Administration. The FDA has determined that such clearance or approval is not necessary. This test is used for clinical purposes. It should not be regarded as investigational or for research. This laboratory is certified under the [place] (CLIA-88) as qualified to perform high complexity clinical laboratory testing.  The controls stained appropriately.   Admission on 02/14/2022, Discharged on 02/14/2022  Component Date Value Ref Range Status   Sodium 02/14/2022 139  135 - 145 mmol/L Final   Potassium 02/14/2022 4.3  3.5 - 5.1 mmol/L Final   Chloride 02/14/2022 106  98 - 111 mmol/L Final   CO2 02/14/2022 23  22 - 32 mmol/L Final   Glucose, Bld 02/14/2022 94  70 - 99 mg/dL Final   Glucose reference range applies only to samples taken after fasting for at least 8 hours.   BUN 02/14/2022 11  8 - 23 mg/dL Final   Creatinine, Ser 02/14/2022 0.94  0.61 - 1.24 mg/dL Final   Calcium 02/14/2022 9.2  8.9 - 10.3 mg/dL Final   Total Protein 02/14/2022 7.1  6.5 - 8.1 g/dL Final   Albumin 02/14/2022 3.9  3.5 - 5.0 g/dL Final   AST 02/14/2022 21  15 - 41 U/L Final   ALT 02/14/2022 14  0 - 44 U/L Final   Alkaline Phosphatase 02/14/2022 64  38 - 126 U/L Final   Total Bilirubin 02/14/2022 0.5  0.3 - 1.2 mg/dL Final   GFR, Estimated 02/14/2022 >60  >60 mL/min Final   Comment: (NOTE) Calculated using the CKD-EPI Creatinine Equation (2021)    Anion gap 02/14/2022 10  5 - 15 Final   Performed at [place] [address], [place], [place] [address]   Lipase 02/14/2022 32  11 - 51 U/L Final   Performed at [place] [address], [place], [place] [address]   WBC 02/14/2022 8.6  4.0 - 10.5 K/uL Final   RBC 02/14/2022 4.96  4.22 - 5.81 MIL/uL Final   Hemoglobin 02/14/2022 15.7  13.0 - 17.0 g/dL Final   HCT 02/14/2022 47.1  39.0 - 52.0 % Final   MCV 02/14/2022 95.0  80.0 - 100.0 fL Final   MCH 02/14/2022 31.7  26.0 - 34.0 pg Final   MCHC 02/14/2022 33.3  30.0 - 36.0 g/dL Final   RDW 02/14/2022 13.2  11.5 - 15.5 % Final   Platelets 02/14/2022 251  150 - 400 K/uL Final   nRBC 02/14/2022 0.0  0.0 - 0.2 % Final   Neutrophils Relative % 02/14/2022 65  % Final   Neutro Abs 02/14/2022 5.5  1.7 - 7.7 K/uL Final   Lymphocytes Relative 02/14/2022 25  % Final   Lymphs Abs 02/14/2022 2.2  0.7 - 4.0 K/uL Final   Monocytes Relative 02/14/2022 5  % Final   Monocytes Absolute 02/14/2022 0.4  0.1 - 1.0 K/uL Final   Eosinophils Relative 02/14/2022 4  % Final   Eosinophils Absolute 02/14/2022 0.4  0.0 - 0.5 K/uL Final   Basophils Relative 02/14/2022 1  % Final   Basophils Absolute 02/14/2022 0.1  0.0 - 0.1 K/uL Final   Immature Granulocytes 02/14/2022 0  % Final   Abs Immature Granulocytes 02/14/2022 0.02  0.00 - 0.07 K/uL Final   Performed at [facility], [place] [address], [place], [place] [address]  Admission on 12/16/2021, Discharged on 12/17/2021  Component Date Value Ref Range Status   Lipase 12/16/2021 38  11 - 51 U/L Final   Performed at [facility], [place] [address], [place], [place] [address]   Sodium 12/16/2021 138  135 - 145 mmol/L Final   Potassium 12/16/2021 3.4 (L)  3.5 - 5.1 mmol/L Final   Chloride 12/16/2021 104  98 - 111 mmol/L Final   CO2 12/16/2021 25  22 - 32 mmol/L Final   Glucose, Bld 12/16/2021 107 (H)  70 - 99 mg/dL Final   Glucose reference range applies only to samples taken after fasting for at least 8 hours.   BUN 12/16/2021 18  8 - 23 mg/dL Final   Creatinine, Ser 12/16/2021 0.77  0.61 - 1.24 mg/dL Final   Calcium 12/16/2021 8.8 (L)  8.9 - 10.3  mg/dL Final   Total Protein 12/16/2021 8.1  6.5 - 8.1 g/dL Final   Albumin 12/16/2021 4.3  3.5 - 5.0 g/dL Final   AST 12/16/2021 19  15 - 41 U/L Final   ALT 12/16/2021 16  0 - 44 U/L Final   Alkaline Phosphatase 12/16/2021 68  38 - 126 U/L Final   Total Bilirubin 12/16/2021 0.5  0.3 - 1.2 mg/dL Final   GFR, Estimated 12/16/2021 >60  >60 mL/min Final   Comment: (NOTE) Calculated using the CKD-EPI Creatinine Equation (2021)    Anion gap 12/16/2021 9  5 - 15 Final   Performed at [facility], [place] [address], [place], [place] [address]   WBC 12/16/2021 11.7 (H)  4.0 - 10.5 K/uL Final   RBC 12/16/2021 4.07 (L)  4.22 - 5.81 MIL/uL Final   Hemoglobin 12/16/2021 13.0  13.0 - 17.0 g/dL Final   HCT 12/16/2021 37.6 (L)  39.0 - 52.0 % Final   MCV 12/16/2021 92.4  80.0 - 100.0 fL Final   MCH 12/16/2021 31.9  26.0 - 34.0 pg Final   MCHC 12/16/2021 34.6  30.0 - 36.0 g/dL Final   RDW 12/16/2021 14.1  11.5 - 15.5 % Final   Platelets 12/16/2021 293  150 - 400 K/uL Final   nRBC 12/16/2021 0.0  0.0 - 0.2 % Final   Neutrophils Relative % 12/16/2021 70  % Final   Neutro Abs 12/16/2021 8.2 (H)  1.7 - 7.7 K/uL Final   Lymphocytes Relative 12/16/2021 23  % Final   Lymphs Abs 12/16/2021 2.6  0.7 - 4.0 K/uL Final   Monocytes Relative 12/16/2021 6  % Final   Monocytes Absolute 12/16/2021 0.7  0.1 - 1.0 K/uL Final   Eosinophils Relative 12/16/2021 1  % Final   Eosinophils Absolute 12/16/2021 0.1  0.0 - 0.5 K/uL Final   Basophils Relative 12/16/2021 0  % Final   Basophils Absolute 12/16/2021 0.0  0.0 - 0.1 K/uL Final   Immature Granulocytes 12/16/2021 0  % Final   Abs Immature Granulocytes 12/16/2021 0.04  0.00 - 0.07 K/uL Final   Performed at [facility], [place] [address], [place], [place] [address]   Color, Urine 12/16/2021 YELLOW  YELLOW Final   APPearance 12/16/2021 CLEAR  CLEAR Final   Specific Gravity, Urine 12/16/2021 1.015  1.005 - 1.030 Final   pH 12/16/2021 7.0   5.0 - 8.0 Final   Glucose, UA 12/16/2021 NEGATIVE  NEGATIVE mg/dL Final   Hgb urine dipstick 12/16/2021 SMALL (A)  NEGATIVE Final   Bilirubin Urine 12/16/2021 NEGATIVE  NEGATIVE Final   Ketones, ur 12/16/2021 NEGATIVE  NEGATIVE mg/dL Final   Protein, ur 12/16/2021 NEGATIVE  NEGATIVE mg/dL Final   Nitrite 12/16/2021 NEGATIVE  NEGATIVE Final   Leukocytes,Ua 12/16/2021 NEGATIVE  NEGATIVE Final   RBC / HPF 12/16/2021 6-10  0 - 5 RBC/hpf Final   WBC, UA 12/16/2021 0-5  0 - 5 WBC/hpf Final   Bacteria, UA 12/16/2021 NONE SEEN  NONE SEEN Final   Mucus 12/16/2021 PRESENT   Final   Amorphous Crystal 12/16/2021 PRESENT   Final   Performed at [facility], [place] [address] [place], [place] [address]   Total CK 12/16/2021 99  49 - 397 U/L Final   Performed at [facility], [place] [address], [place], [place] [address]   Troponin I (High Sensitivity) 12/16/2021 23 (H)  <18 ng/L Final   Comment: (NOTE) Elevated high sensitivity troponin I (hsTnI) values and significant  changes across serial measurements may suggest ACS but many other  chronic and acute conditions are known to elevate hsTnI results.  Refer to the "Links" section for chest pain algorithms and additional  guidance. Performed at [facility], [place] [address], [place], [place] [address]    Troponin I (High Sensitivity) 12/17/2021 48 (H)  <18 ng/L Final   Comment: DELTA CHECK NOTED (NOTE) Elevated high sensitivity troponin I (hsTnI) values and significant  changes across serial measurements may suggest ACS but many other  chronic and acute conditions are known to elevate hsTnI results.  Refer to the Links section for chest pain algorithms and additional  guidance. Performed at [facility], [place] [address], [place], [place] [address]    HIV Screen 4th Generation wRfx 12/17/2021 Non Reactive  Non Reactive Final   Performed at [facility], [place] [address], [place], [place] [address]   Lipoprotein (a) 12/17/2021 27.0  <75.0 nmol/L Final   Comment: (NOTE) Note:  Values greater than or equal to 75.0 nmol/L may       indicate an independent risk factor for CHD,       but must be evaluated with caution when applied       to non-Caucasian populations due to the       influence of genetic factors on Lp(a) across       ethnicities. Performed At: [facility] [place], [place] [id number] Rush Farmer MD [id number]    Opiates 12/16/2021 NONE DETECTED  NONE DETECTED Final   Cocaine 12/16/2021 NONE DETECTED  NONE DETECTED Final   Benzodiazepines 12/16/2021 NONE DETECTED  NONE DETECTED Final   Amphetamines 12/16/2021 NONE DETECTED  NONE DETECTED Final   Tetrahydrocannabinol 12/16/2021 NONE DETECTED  NONE DETECTED Final   Barbiturates 12/16/2021 NONE DETECTED  NONE DETECTED Final   Comment: (NOTE) DRUG SCREEN FOR MEDICAL PURPOSES ONLY.  IF CONFIRMATION IS NEEDED FOR ANY PURPOSE, NOTIFY LAB WITHIN 5 DAYS.  LOWEST DETECTABLE LIMITS FOR URINE DRUG SCREEN Drug Class  Cutoff (ng/mL) Amphetamine and metabolites    1000 Barbiturate and metabolites    200 Benzodiazepine                 132 Tricyclics and metabolites     300 Opiates and metabolites        300 Cocaine and metabolites        300 THC                            50 Performed at [facility], [place] [address] [place], [place] [address]    SARS Coronavirus 2 by RT PCR 12/17/2021 NEGATIVE  NEGATIVE Final   Comment: (NOTE) SARS-CoV-2 target nucleic acids are NOT DETECTED.  The SARS-CoV-2 RNA is generally detectable in upper and lower respiratory specimens during the acute phase of infection. The lowest concentration of SARS-CoV-2 viral copies this assay can detect is 250 copies / mL. A negative result does not preclude SARS-CoV-2 infection and should not be used as the sole basis for treatment or other patient management  decisions.  A negative result may occur with improper specimen collection / handling, submission of specimen other than nasopharyngeal swab, presence of viral mutation(s) within the areas targeted by this assay, and inadequate number of viral copies (<250 copies / mL). A negative result must be combined with clinical observations, patient history, and epidemiological information.  Fact Sheet for Patients:   https://www.patel.info/  Fact Sheet for Healthcare Providers: https://hall.com/  This test is not yet approved or                           cleared by the [place] FDA and has been authorized for detection and/or diagnosis of SARS-CoV-2 by FDA under an Emergency Use Authorization (EUA).  This EUA will remain in effect (meaning this test can be used) for the duration of the COVID-19 declaration under Section 564(b)(1) of the Act, 21 U.S.C. section 360bbb-3(b)(1), unless the authorization is terminated or revoked sooner.  Performed at [facility], [place] [address], [place], [place] [address]    Sodium 12/17/2021 137  135 - 145 mmol/L Final   Potassium 12/17/2021 3.5  3.5 - 5.1 mmol/L Final   Chloride 12/17/2021 103  98 - 111 mmol/L Final   CO2 12/17/2021 27  22 - 32 mmol/L Final   Glucose, Bld 12/17/2021 84  70 - 99 mg/dL Final   Glucose reference range applies only to samples taken after fasting for at least 8 hours.   BUN 12/17/2021 13  8 - 23 mg/dL Final   Creatinine, Ser 12/17/2021 0.85  0.61 - 1.24 mg/dL Final   Calcium 12/17/2021 8.6 (L)  8.9 - 10.3 mg/dL Final   Total Protein 12/17/2021 6.2 (L)  6.5 - 8.1 g/dL Final   Albumin 12/17/2021 3.2 (L)  3.5 - 5.0 g/dL Final   AST 12/17/2021 17  15 - 41 U/L Final   ALT 12/17/2021 15  0 - 44 U/L Final   Alkaline Phosphatase 12/17/2021 56  38 - 126 U/L Final   Total Bilirubin 12/17/2021 0.4  0.3 - 1.2 mg/dL Final   GFR, Estimated 12/17/2021 >60  >60 mL/min Final    Comment: (NOTE) Calculated using the CKD-EPI Creatinine Equation (2021)    Anion gap 12/17/2021 7  5 - 15 Final   Performed at [place] [address], [place], [place] [address]  Heparin Unfractionated 12/17/2021 <0.10 (L)  0.30 - 0.70 IU/mL Final   Comment: (NOTE) The clinical reportable range upper limit is being lowered to >1.10 to align with the FDA approved guidance for the current laboratory assay.  If heparin results are below expected values, and patient dosage has  been confirmed, suggest follow up testing of antithrombin III levels. Performed at [facility], [place] [address] [place], [place] [address]    WBC 12/17/2021 7.4  4.0 - 10.5 K/uL Final   RBC 12/17/2021 3.72 (L)  4.22 - 5.81 MIL/uL Final   Hemoglobin 12/17/2021 11.4 (L)  13.0 - 17.0 g/dL Final   HCT 12/17/2021 35.4 (L)  39.0 - 52.0 % Final   MCV 12/17/2021 95.2  80.0 - 100.0 fL Final   MCH 12/17/2021 30.6  26.0 - 34.0 pg Final   MCHC 12/17/2021 32.2  30.0 - 36.0 g/dL Final   RDW 12/17/2021 14.3  11.5 - 15.5 % Final   Platelets 12/17/2021 241  150 - 400 K/uL Final   nRBC 12/17/2021 0.0  0.0 - 0.2 % Final   Performed at [place] [address], [place], [place] [address]    Blood Alcohol level:  Lab Results  Component Value Date   ETH <10 09/24/2020   ETH <10 07/27/7251    Metabolic Disorder Labs: Lab Results  Component Value Date   HGBA1C 5.2 04/26/2022   MPG 102.54 04/26/2022   MPG 103 12/28/2016   No results found for: "PROLACTIN" Lab Results  Component Value Date   CHOL 83 04/26/2022   TRIG 52 04/26/2022   HDL 28 (L) 04/26/2022   CHOLHDL 3.0 04/26/2022   VLDL 10 04/26/2022   LDLCALC 45 04/26/2022   LDLCALC 41 08/31/2021    Therapeutic Lab Levels: No results found for: "LITHIUM" Lab Results  Component Value Date   VALPROATE 22 (L) 04/26/2022   VALPROATE 18 (L) 10/07/2021   No results found for: "CBMZ"  Physical Findings   AIMS    Flowsheet Row  Admission (Discharged) from 12/27/2016 in [place] Total Score 1      AUDIT    Flowsheet Row Admission (Discharged) from 12/27/2016 in [place]  Alcohol Use Disorder Identification Test Final Score (AUDIT) 32      GAD-7    [place] from 06/03/2019 in [place]  Total GAD-7 Score 14      PHQ2-9    [place] ED from 04/26/2022 in [facility] Office Visit from 01/03/2022 in [place] Office Visit from 08/31/2021 in [place] Visit from 06/21/2021 in [place] from 03/12/2021 in [facility]  PHQ-2 Total Score 3 2 0 2 4  PHQ-9 Total Score '13 5 1 7 16      '$ Flowsheet Row ED from 04/26/2022 in [facility] ED to Hosp-Admission (Discharged) from 02/28/2022 in [facility] ED to Hosp-Admission (Discharged) from 02/16/2022 in [place] CATEGORY No Risk No Risk No Risk        Musculoskeletal  Strength & Muscle Tone: within normal limits Gait & Station: normal Patient leans: N/A  Psychiatric Specialty Exam  Presentation  General Appearance:  Appropriate for Environment  Eye Contact: Good  Speech: Clear and Coherent  Speech Volume: Normal  Handedness:  Right   Mood and Affect  Mood: Anxious  Affect: Appropriate   Thought Process  Thought Processes: Coherent  Descriptions of Associations:Intact  Orientation:Full (Time, Place and Person)  Thought Content:Logical  Diagnosis of Schizophrenia or Schizoaffective disorder in past: yes  Hallucinations: denies Ideas of Reference:None  Suicidal Thoughts: denies Homicidal Thoughts: Denies  Sensorium  Memory: Immediate Good; Recent  Fair  Judgment: Fair  Insight: Fair   Community education officer  Concentration: Fair  Attention Span: Fair  Recall: [place] of Knowledge: Fair  Language: Good   Psychomotor Activity  Psychomotor Activity:Psychomotor Activity: Normal   Assets  Assets: Communication Skills; Desire for Improvement; Social Support   Sleep  Sleep: good  Physical Exam  Physical Exam Constitutional:      Appearance: the patient is not toxic-appearing.  Pulmonary:     Effort: Pulmonary effort is normal.  Neurological:     General: No focal deficit present.     Mental Status: the patient is alert and oriented to person, place, and time.   Review of Systems  Respiratory:  Negative for shortness of breath.   Cardiovascular:  Negative for chest pain.  Gastrointestinal:  Negative for abdominal pain, constipation, diarrhea, nausea and vomiting.  Neurological:  Negative for headaches.   Blood pressure 95/65, pulse (!) 16, temperature 98.5 F (36.9 C), temperature source Oral, resp. rate 16, SpO2 99 %. There is no height or weight on file to calculate BMI.  Treatment Plan Summary: Daily contact with patient to assess and evaluate symptoms and progress in treatment and Medication management   Continue home medications as ordered.  Added Tums twice daily for heartburn Add Colace 100 mg daily as needed for constipation  We will taper the patient off Suboxone as follows: - 10/7: 4 mg in the morning, no nightly dose - 10/8: Discontinue - 10/9: Patient goes to [place]: Patient accepted to [facility] for 9 AM Monday.  Can consider deferring admission if the patient experiences withdrawal symptoms, low concern for this.    Armando Reichert, MD 04/30/2022 1:41 PM

## 2022-04-30 NOTE — ED Notes (Signed)
Pt sitting in dayroom interacting and watching tv with another peer. No acute distress noted. Informed pt to notify staff with any needs or concerns. Will continue to monitor for safety.

## 2022-04-30 NOTE — ED Notes (Signed)
Pt sleeping in no acute distress. RR even and unlabored. Safety maintained. 

## 2022-04-30 NOTE — ED Notes (Addendum)
Pt sitting in dining room watching TV. A&O x4, calm and cooperative. Denies current SI/HI/AVH. No signs of distress noted. Monitoring for safety.

## 2022-04-30 NOTE — ED Notes (Signed)
Extra breakfast given

## 2022-04-30 NOTE — ED Notes (Signed)
Patient with some difficulty sleeping tonight - provider with new order for hydroxyzine - will continue to monitor for safety

## 2022-05-01 DIAGNOSIS — F250 Schizoaffective disorder, bipolar type: Secondary | ICD-10-CM | POA: Diagnosis not present

## 2022-05-01 DIAGNOSIS — F4310 Post-traumatic stress disorder, unspecified: Secondary | ICD-10-CM | POA: Diagnosis not present

## 2022-05-01 DIAGNOSIS — F1410 Cocaine abuse, uncomplicated: Secondary | ICD-10-CM | POA: Diagnosis not present

## 2022-05-01 DIAGNOSIS — F1910 Other psychoactive substance abuse, uncomplicated: Secondary | ICD-10-CM | POA: Diagnosis not present

## 2022-05-01 DIAGNOSIS — Z1152 Encounter for screening for COVID-19: Secondary | ICD-10-CM | POA: Diagnosis not present

## 2022-05-01 MED ORDER — DICLOFENAC SODIUM 1 % EX GEL: 2.0000 g | Freq: Four times a day (QID) | CUTANEOUS | 0 refills | Status: DC

## 2022-05-01 MED ORDER — TAMSULOSIN HCL 0.4 MG PO CAPS: 0.4000 mg | ORAL_CAPSULE | Freq: Every day | ORAL | 0 refills | Status: DC

## 2022-05-01 MED ORDER — ENALAPRIL MALEATE 2.5 MG PO TABS: 2.5000 mg | ORAL_TABLET | Freq: Two times a day (BID) | ORAL | 0 refills | Status: DC

## 2022-05-01 MED ORDER — FLUTICASONE PROPIONATE 50 MCG/ACT NA SUSP: 1.0000 | Freq: Every evening | NASAL | 0 refills | Status: DC

## 2022-05-01 MED ORDER — LORATADINE 10 MG PO TABS: 10.0000 mg | ORAL_TABLET | Freq: Every day | ORAL | 0 refills | Status: DC

## 2022-05-01 MED ORDER — OMEGA-3-ACID ETHYL ESTERS 1 G PO CAPS: 1.0000 g | ORAL_CAPSULE | Freq: Every day | ORAL | 0 refills | Status: DC

## 2022-05-01 MED ORDER — DIVALPROEX SODIUM 250 MG PO DR TAB: 250.0000 mg | DELAYED_RELEASE_TABLET | Freq: Two times a day (BID) | ORAL | 0 refills | Status: DC

## 2022-05-01 MED ORDER — ROPINIROLE HCL 2 MG PO TABS: 2.0000 mg | ORAL_TABLET | Freq: Every day | ORAL | 0 refills | Status: DC

## 2022-05-01 MED ORDER — ADULT MULTIVITAMIN W/MINERALS CH: 1.0000 | ORAL_TABLET | Freq: Every day | ORAL | 0 refills | Status: DC

## 2022-05-01 MED ORDER — MELOXICAM 7.5 MG PO TABS: 7.5000 mg | ORAL_TABLET | Freq: Every day | ORAL | 0 refills | Status: DC

## 2022-05-01 MED ORDER — DOCUSATE SODIUM 100 MG PO CAPS: 100.0000 mg | ORAL_CAPSULE | Freq: Every day | ORAL | 0 refills | Status: DC | PRN

## 2022-05-01 MED ORDER — NITROGLYCERIN 0.3 MG SL SUBL: 0.3000 mg | SUBLINGUAL_TABLET | SUBLINGUAL | 0 refills | Status: DC | PRN

## 2022-05-01 MED ORDER — TRAZODONE HCL 50 MG PO TABS: 50.0000 mg | ORAL_TABLET | Freq: Every evening | ORAL | Status: DC | PRN

## 2022-05-01 MED ORDER — CLOPIDOGREL BISULFATE 75 MG PO TABS: 75.0000 mg | ORAL_TABLET | Freq: Every day | ORAL | 0 refills | Status: DC

## 2022-05-01 MED ORDER — DULOXETINE HCL 60 MG PO CPEP: 60.0000 mg | ORAL_CAPSULE | Freq: Every day | ORAL | 0 refills | Status: DC

## 2022-05-01 MED ORDER — ATORVASTATIN CALCIUM 80 MG PO TABS: 80.0000 mg | ORAL_TABLET | Freq: Every day | ORAL | 0 refills | Status: DC

## 2022-05-01 MED ORDER — TRAZODONE HCL 50 MG PO TABS: 50.0000 mg | ORAL_TABLET | Freq: Every evening | ORAL | 0 refills | Status: DC | PRN

## 2022-05-01 MED ORDER — TAMSULOSIN HCL 0.4 MG PO CAPS: 0.4000 mg | ORAL_CAPSULE | Freq: Every day | ORAL | 1 refills | Status: DC

## 2022-05-01 MED ORDER — VITAMIN B-1 100 MG PO TABS: 100.0000 mg | ORAL_TABLET | Freq: Every day | ORAL | 0 refills | Status: DC

## 2022-05-01 MED ORDER — PANTOPRAZOLE SODIUM 20 MG PO TBEC: 20.0000 mg | DELAYED_RELEASE_TABLET | Freq: Every day | ORAL | 0 refills | Status: DC

## 2022-05-01 MED ORDER — ALBUTEROL SULFATE HFA 108 (90 BASE) MCG/ACT IN AERS
2.0000 | INHALATION_SPRAY | Freq: Four times a day (QID) | RESPIRATORY_TRACT | Status: DC | PRN
  Filled 2022-05-01: qty 6.7

## 2022-05-01 MED ORDER — PANTOPRAZOLE SODIUM 20 MG PO TBEC
20.0000 mg | DELAYED_RELEASE_TABLET | Freq: Every day | ORAL | Status: DC
  Administered 2022-05-01 – 2022-05-02 (×2): 20 mg via ORAL
  Filled 2022-05-01: qty 1
  Filled 2022-05-01: qty 14
  Filled 2022-05-01: qty 1
  Filled 2022-05-01: qty 14

## 2022-05-01 NOTE — ED Notes (Signed)
Pt asleep in bed. Respirations even and unlabored. Monitoring for safety. 

## 2022-05-01 NOTE — Discharge Instructions (Addendum)
Pt discharged to [facility] on 05/02/22 for Substance abuse residential rehab. 30 days samples and paper prescriptions given to Pt.   Prescriptions given at discharge. Patient agreeable to plan. Given opportunity to ask questions.  Appears to feel comfortable with discharge denies any current suicidal or homicidal thought. Patient is also instructed prior to discharge to: Take all medications as prescribed by his mental healthcare provider. Report any adverse effects and or reactions from the medicines to his outpatient provider promptly. Patient has been instructed & cautioned: To not engage in alcohol and or illegal drug use while on prescription medicines. In the event of worsening symptoms, patient is instructed to call the crisis hotline, 911 and or go to the nearest ED for appropriate evaluation and treatment of symptoms. To follow-up with his primary care provider for your other medical issues, concerns and or health care needs.

## 2022-05-01 NOTE — ED Notes (Signed)
Patient is sitting in the common area watching television. Patient has been calm and cooperative during his stay at the [facility]. Patient is being monitored for safety.

## 2022-05-01 NOTE — ED Provider Notes (Signed)
FBC/OBS ASAP Discharge Summary  Date and Time: 05/01/2022 3:20 PM  Name: Keith Gibbs  MRN:  [id number]   Discharge Diagnoses:  Final diagnoses:  Polysubstance abuse [facility])    Subjective: Keith Gibbs is a 61 y.o. male, with past medical history of schizoaffective disorder (bipolar type), PTSD, chronic pain, opioid use disorder, alcohol use disorder, cocaine use disorder, cannabis use disorder and tobacco use disorder. He has significant history of multiple suicide attempts (3) and multiple inpatient behavioral health admissions, most recent 09/2020. He presented to [facility] accompanied by his mother for treatment of his substance abuse.   Stay Summary: Pt was admitted to the [facility] for detox. Pt expressed interest in going to residential rehab.  Pt was restarted on his home medications. Pt was started on COWS with Clonidine taper for Opoid withdrawal symptoms and CIWA for Alcohol withdrawal. Pt was also started on suboxone taper while in the unit. Pt was reevaluated today. Today, he reported improved mood and anxiety and denied SI, HI and AVH. Denied any side effects from medications. Reported that his mood is "good".  He denied any withdrawal symptoms or cravings.  Patient has been accepted to [facility] for substance abuse residential rehab for Monday 05/02/22. Pt states he feels safe going to [facility]. Pt will be transported to [facility] via safe transport.  Total Time spent with patient: 15 minutes  Past Psychiatric History: per H&P Past Medical History:  Past Medical History:  Diagnosis Date   Anginal pain ([place])    Anxiety    Arthritis    Bipolar 1 disorder (HCC)    Bursitis    CAD (coronary artery disease)    Chronic pain    COPD (chronic obstructive pulmonary disease) (HCC)    Current use of long term anticoagulation    DAPT (ASA + clopidogrel)   Depression    Diverticulitis    Dyspnea    GERD (gastroesophageal reflux disease)    Grade I diastolic dysfunction    Hepatitis C 2012    No longer has Hep C   HLD (hyperlipidemia)    Hypertension    MI (myocardial infarction) ([place])    Polysubstance abuse (HCC)    cocaine, marijuana, ETOH   PUD (peptic ulcer disease)    S/P angioplasty with stent 06/10/2016   a.) 90% stenosis of pLAD to mLAD - 2.5 x 18 mm Xience Alpine (DES x 1) placed to pLAD   S/P PTCA (percutaneous transluminal coronary angioplasty) 12/04/2019   a.) 60% in stent restenosis of DES to pLAD; LVEF 65%.   Schizophrenia ([place])    Stroke [facility])    Valvular insufficiency    a.) Mild MR, TR, PR; mild to moderate AR on 03/05/2018 TTE    Past Surgical History:  Procedure Laterality Date   ABDOMINAL SURGERY     removed small piece of intestines due to [facility] Diverticulosis   APPENDECTOMY     BACK SURGERY     CARDIAC CATHETERIZATION Left 06/10/2016   Procedure: Left Heart Cath and Coronary Angiography;  Surgeon: Dionisio David, MD;  Location: [place] CV LAB;  Service: Cardiovascular;  Laterality: Left;   CARDIAC CATHETERIZATION N/A 06/10/2016   Procedure: Coronary Stent Intervention;  Surgeon: Yolonda Kida, MD;  Location: [place] CV LAB;  Service: Cardiovascular;  Laterality: N/A;   CHOLECYSTECTOMY N/A 02/17/2022   Procedure: LAPAROSCOPIC CHOLECYSTECTOMY;  Surgeon: Kieth Brightly Arta Bruce, MD;  Location: [place];  Service: General;  Laterality: N/A;   COLON SURGERY  COLONOSCOPY     COLONOSCOPY WITH PROPOFOL N/A 01/05/2017   Procedure: COLONOSCOPY WITH PROPOFOL;  Surgeon: Jonathon Bellows, MD;  Location: [facility] ENDOSCOPY;  Service: Endoscopy;  Laterality: N/A;   COLONOSCOPY WITH PROPOFOL N/A 02/13/2020   Procedure: COLONOSCOPY WITH PROPOFOL;  Surgeon: Virgel Manifold, MD;  Location: [facility] ENDOSCOPY;  Service: Endoscopy;  Laterality: N/A;   CORONARY ANGIOPLASTY WITH STENT PLACEMENT     ESOPHAGOGASTRODUODENOSCOPY (EGD) WITH PROPOFOL N/A 01/05/2017   Procedure: ESOPHAGOGASTRODUODENOSCOPY (EGD) WITH PROPOFOL;  Surgeon: Jonathon Bellows, MD;  Location: [facility]  ENDOSCOPY;  Service: Endoscopy;  Laterality: N/A;   ESOPHAGOGASTRODUODENOSCOPY (EGD) WITH PROPOFOL N/A 02/13/2020   Procedure: ESOPHAGOGASTRODUODENOSCOPY (EGD) WITH PROPOFOL;  Surgeon: Virgel Manifold, MD;  Location: [facility] ENDOSCOPY;  Service: Endoscopy;  Laterality: N/A;   INTRAVASCULAR PRESSURE WIRE/FFR STUDY N/A 12/04/2019   Procedure: INTRAVASCULAR PRESSURE WIRE/FFR STUDY;  Surgeon: Sherren Mocha, MD;  Location: [place] CV LAB;  Service: Cardiovascular;  Laterality: N/A;   KNEE ARTHROSCOPY WITH MEDIAL MENISECTOMY Right 09/05/2017   Procedure: KNEE ARTHROSCOPY WITH MEDIAL AND LATERAL  MENISECTOMY PARTIAL SYNOVECTOMY;  Surgeon: Hessie Knows, MD;  Location: [facility] ORS;  Service: Orthopedics;  Laterality: Right;   LEFT HEART CATH AND CORONARY ANGIOGRAPHY N/A 12/04/2019   Procedure: LEFT HEART CATH AND CORONARY ANGIOGRAPHY;  Surgeon: Sherren Mocha, MD;  Location: [place] CV LAB;  Service: Cardiovascular;  Laterality: N/A;   SHOULDER SURGERY Right 04/09/2012   SPINE SURGERY     TOTAL KNEE ARTHROPLASTY Right 01/19/2021   Procedure: TOTAL KNEE ARTHROPLASTY - Rachelle Hora to Assist;  Surgeon: Hessie Knows, MD;  Location: [facility] ORS;  Service: Orthopedics;  Laterality: Right;   Family History:  Family History  Problem Relation Age of Onset   Osteoarthritis Mother    Heart disease Mother    Hypertension Mother    Depression Mother    Heart disease Father    Early death Father    Hypertension Father    Heart attack Father    Hypertension Sister    Prostate cancer Neg Hx    Bladder Cancer Neg Hx    Kidney cancer Neg Hx    Family Psychiatric History: Per H&P Social History:  Social History   Substance and Sexual Activity  Alcohol Use Yes   Alcohol/week: 6.0 standard drinks of alcohol   Types: 6 Cans of beer per week   Comment: occassionally      Social History   Substance and Sexual Activity  Drug Use Not Currently   Types: Cocaine, Marijuana   Comment: last on  saturday    Social History   Socioeconomic History   Marital status: Widowed    Spouse name: Not on file   Number of children: Not on file   Years of education: Not on file   Highest education level: Not on file  Occupational History   Occupation: disability   Occupation: stocking at gas station    Comment: 2 days per week  Tobacco Use   Smoking status: Some Days    Packs/day: 0.50    Years: 32.00    Total pack years: 16.00    Types: Cigarettes    Last attempt to quit: 10/25/2021    Years since quitting: 0.5   Smokeless tobacco: Never   Tobacco comments:    Continues to smoke approximately 5 cigarettes per day.  Has been 2 days since las cigarette.  Has not bought any more.  Using patches and gum.  5/23 hfb  Vaping Use   Vaping Use:  Never used  Substance and Sexual Activity   Alcohol use: Yes    Alcohol/week: 6.0 standard drinks of alcohol    Types: 6 Cans of beer per week    Comment: occassionally    Drug use: Not Currently    Types: Cocaine, Marijuana    Comment: last on saturday   Sexual activity: Yes    Partners: Female    Birth control/protection: None  Other Topics Concern   Not on file  Social History Narrative   Not on file   Social Determinants of Health   Financial Resource Strain: Not on file  Food Insecurity: Not on file  Transportation Needs: Not on file  Physical Activity: Not on file  Stress: Not on file  Social Connections: Not on file   SDOH:  SDOH Screenings   Alcohol Screen: Low Risk  (06/21/2021)  Depression (PHQ2-9): High Risk (04/30/2022)  Tobacco Use: High Risk (04/27/2022)    Tobacco Cessation:  N/A, patient does not currently use tobacco products  Current Medications:  Current Facility-Administered Medications  Medication Dose Route Frequency Provider Last Rate Last Admin   acetaminophen (TYLENOL) tablet 650 mg  650 mg Oral Q6H PRN Merrily Brittle, DO   650 mg at 05/01/22 1040   albuterol (VENTOLIN HFA) 108 (90 Base) MCG/ACT inhaler  2 puff  2 puff Inhalation Q6H PRN Armando Reichert, MD       alum & mag hydroxide-simeth (MAALOX/MYLANTA) 200-200-20 MG/5ML suspension 30 mL  30 mL Oral Q4H PRN Merrily Brittle, DO   30 mL at 04/30/22 1809   aspirin EC tablet 81 mg  81 mg Oral Daily Merrily Brittle, DO   81 mg at 05/01/22 0911   atorvastatin (LIPITOR) tablet 80 mg  80 mg Oral QHS Merrily Brittle, DO   80 mg at 04/30/22 2115   Budeson-Glycopyrrol-Formoterol 160-9-4.8 MCG/ACT AERO 2 puff  2 puff Mouth/Throat Q12H Merrily Brittle, DO   2 puff at 05/01/22 0910   calcium carbonate (TUMS - dosed in mg elemental calcium) chewable tablet 200 mg of elemental calcium  1 tablet Oral BID WC Corky Sox, MD   200 mg of elemental calcium at 05/01/22 0908   clopidogrel (PLAVIX) tablet 75 mg  75 mg Oral Daily Merrily Brittle, DO   75 mg at 05/01/22 4268   diclofenac Sodium (VOLTAREN) 1 % topical gel 2 g  2 g Topical QID Merrily Brittle, DO   2 g at 05/01/22 0915   dicyclomine (BENTYL) tablet 20 mg  20 mg Oral Q6H PRN Corky Sox, MD       divalproex (DEPAKOTE) DR tablet 250 mg  250 mg Oral BID Merrily Brittle, DO   250 mg at 05/01/22 0911   docusate sodium (COLACE) capsule 100 mg  100 mg Oral Daily PRN Armando Reichert, MD   100 mg at 04/30/22 1808   DULoxetine (CYMBALTA) DR capsule 60 mg  60 mg Oral Daily Merrily Brittle, DO   60 mg at 05/01/22 0911   enalapril (VASOTEC) tablet 2.5 mg  2.5 mg Oral BID Merrily Brittle, DO   2.5 mg at 05/01/22 0911   fluticasone (FLONASE) 50 MCG/ACT nasal spray 1 spray  1 spray Each Nare QHS Merrily Brittle, DO   1 spray at 04/30/22 2116   loratadine (CLARITIN) tablet 10 mg  10 mg Oral Daily Merrily Brittle, DO   10 mg at 05/01/22 0911   magnesium hydroxide (MILK OF MAGNESIA) suspension 30 mL  30 mL Oral Daily PRN Merrily Brittle, DO  30 mL at 04/30/22 1339   meloxicam (MOBIC) tablet 7.5 mg  7.5 mg Oral Daily Merrily Brittle, DO   7.5 mg at 05/01/22 4010   methocarbamol (ROBAXIN) tablet 500 mg  500 mg Oral Q8H PRN Corky Sox, MD        multivitamin with minerals tablet 1 tablet  1 tablet Oral Daily Merrily Brittle, DO   1 tablet at 05/01/22 0911   naproxen (NAPROSYN) tablet 500 mg  500 mg Oral BID PRN Corky Sox, MD       nicotine (NICODERM CQ - dosed in mg/24 hours) patch 21 mg  21 mg Transdermal Daily Revonda Humphrey, NP   21 mg at 05/01/22 2725   nicotine polacrilex (NICORETTE) gum 4 mg  4 mg Oral PRN Merrily Brittle, DO       nitroGLYCERIN (NITROSTAT) SL tablet 0.4 mg  0.4 mg Sublingual Q5 min PRN Merrily Brittle, DO       omega-3 acid ethyl esters (LOVAZA) capsule 1 g  1 g Oral Daily Merrily Brittle, DO   1 g at 05/01/22 0911   ondansetron (ZOFRAN-ODT) disintegrating tablet 4 mg  4 mg Oral Q6H PRN Corky Sox, MD       oxymetazoline (AFRIN) 0.05 % nasal spray 1 spray  1 spray Each Nare BID PRN [place], Chinwendu V, NP   1 spray at 04/29/22 0020   pantoprazole (PROTONIX) EC tablet 20 mg  20 mg Oral Daily Orell Hurtado, MD   20 mg at 05/01/22 1306   rOPINIRole (REQUIP) tablet 2 mg  2 mg Oral QHS Merrily Brittle, DO   2 mg at 04/30/22 2116   tamsulosin (FLOMAX) capsule 0.4 mg  0.4 mg Oral Daily Merrily Brittle, DO   0.4 mg at 05/01/22 3664   thiamine (VITAMIN B1) tablet 100 mg  100 mg Oral Daily Merrily Brittle, DO   100 mg at 05/01/22 0911   traZODone (DESYREL) tablet 50 mg  50 mg Oral QHS PRN Merrily Brittle, DO   50 mg at 04/30/22 2116   Current Outpatient Medications  Medication Sig Dispense Refill   albuterol (VENTOLIN HFA) 108 (90 Base) MCG/ACT inhaler Inhale 2 puffs into the lungs every 6 (six) hours as needed for wheezing or shortness of breath. 18 g 6   aspirin EC 81 MG tablet Take 1 tablet (81 mg total) by mouth daily. Swallow whole. 30 tablet 12   atorvastatin (LIPITOR) 80 MG tablet Take 1 tablet (80 mg total) by mouth at bedtime. 30 tablet 0   clopidogrel (PLAVIX) 75 MG tablet Take 1 tablet (75 mg total) by mouth daily. 30 tablet 0   diclofenac Sodium (VOLTAREN) 1 % GEL Apply 2 g topically 4 (four) times daily. 2 g  0   divalproex (DEPAKOTE) 250 MG DR tablet Take 1 tablet (250 mg total) by mouth 2 (two) times daily. 60 tablet 0   docusate sodium (COLACE) 100 MG capsule Take 1 capsule (100 mg total) by mouth daily as needed for mild constipation. 10 capsule 0   DULoxetine (CYMBALTA) 60 MG capsule Take 1 capsule (60 mg total) by mouth daily. 30 capsule 0   enalapril (VASOTEC) 2.5 MG tablet Take 1 tablet (2.5 mg total) by mouth 2 (two) times daily. 60 tablet 0   fluticasone (FLONASE) 50 MCG/ACT nasal spray Place 1 spray into both nostrils at bedtime. 9.9 mL 0   [START ON 05/02/2022] loratadine (CLARITIN) 10 MG tablet Take 1 tablet (10 mg total) by mouth daily. 30 tablet 0   [  START ON 05/02/2022] meloxicam (MOBIC) 7.5 MG tablet Take 1 tablet (7.5 mg total) by mouth daily. 30 tablet 0   [START ON 05/02/2022] Multiple Vitamin (MULTIVITAMIN WITH MINERALS) TABS tablet Take 1 tablet by mouth daily. 30 tablet 0   nitroGLYCERIN (NITROSTAT) 0.3 MG SL tablet Place 1 tablet (0.3 mg total) under the tongue every 5 (five) minutes as needed for chest pain. Maximum of 3 doses daily. 90 tablet 0   [START ON 05/02/2022] omega-3 acid ethyl esters (LOVAZA) 1 g capsule Take 1 capsule (1 g total) by mouth daily. 30 capsule 0   pantoprazole (PROTONIX) 20 MG tablet Take 1 tablet (20 mg total) by mouth daily for 15 days. 15 tablet 0   rOPINIRole (REQUIP) 2 MG tablet Take 1 tablet (2 mg total) by mouth at bedtime. TAKE 1 TABLET BY MOUTH EVERYDAY AT BEDTIME Strength: 2 mg 30 tablet 0   tamsulosin (FLOMAX) 0.4 MG CAPS capsule Take 1 capsule (0.4 mg total) by mouth daily. 30 capsule 0   [START ON 05/02/2022] thiamine (VITAMIN B-1) 100 MG tablet Take 1 tablet (100 mg total) by mouth daily. 30 tablet 0   traZODone (DESYREL) 50 MG tablet Take 1 tablet (50 mg total) by mouth at bedtime as needed for sleep. 30 tablet 0    PTA Medications: (Not in a hospital admission)      04/30/2022    1:39 PM 01/03/2022   11:06 AM 08/31/2021    1:26 PM   Depression screen PHQ 2/9  Decreased Interest 1 1 0  Down, Depressed, Hopeless 2 1 0  PHQ - 2 Score 3 2 0  Altered sleeping 3 1 0  Tired, decreased energy '2 1 1  '$ Change in appetite 2 1 0  Feeling bad or failure about yourself  1  0  Trouble concentrating 1 0 0  Moving slowly or fidgety/restless 1 0 0  Suicidal thoughts 0 0 0  PHQ-9 Score '13 5 1  '$ Difficult doing work/chores Somewhat difficult  Not difficult at all    [place] [facility] from 04/26/2022 in [facility] ED to Hosp-Admission (Discharged) from 02/28/2022 in [facility] ED to Hosp-Admission (Discharged) from 02/16/2022 in [facility] CATEGORY No Risk No Risk No Risk       Musculoskeletal  Strength & Muscle Tone: within normal limits Gait & Station: normal Patient leans: N/A  Psychiatric Specialty Exam  Presentation  General Appearance:  Appropriate for Environment  Eye Contact: Good  Speech: Clear and Coherent  Speech Volume: Normal  Handedness: Right   Mood and Affect  Mood: Anxious  Affect: Appropriate   Thought Process  Thought Processes: Coherent  Descriptions of Associations:Intact  Orientation:Full (Time, Place and Person)  Thought Content:Logical  Diagnosis of Schizophrenia or Schizoaffective disorder in past: No data recorded   Hallucinations:Hallucinations: None  Ideas of Reference:None  Suicidal Thoughts:Suicidal Thoughts: No  Homicidal Thoughts:Homicidal Thoughts: No   Sensorium  Memory: Immediate Good; Recent Fair  Judgment: Fair  Insight: Fair   Community education officer  Concentration: Fair  Attention Span: Fair  Recall: [place] of Knowledge: Fair  Language: Good   Psychomotor Activity  Psychomotor Activity: Psychomotor Activity: Normal   Assets  Assets: Communication Skills; Desire for Improvement; Social Support   Sleep  Sleep: Sleep: Fair   No  data recorded  Physical Exam  Physical Exam Vitals and nursing note reviewed.  Constitutional:  General: He is not in acute distress.    Appearance: Normal appearance. He is not ill-appearing, toxic-appearing or diaphoretic.  Pulmonary:     Effort: Pulmonary effort is normal.  Neurological:     Mental Status: He is alert and oriented to person, place, and time.    Review of Systems  Constitutional:  Negative for chills and fever.  Respiratory:  Negative for cough.   Cardiovascular:  Negative for chest pain.  Gastrointestinal:  Negative for abdominal pain, nausea and vomiting.  Neurological:  Negative for dizziness and headaches.  Psychiatric/Behavioral:  Negative for hallucinations and suicidal ideas.    Blood pressure (!) 110/56, pulse (!) 58, temperature 98.5 F (36.9 C), temperature source Oral, resp. rate 17, SpO2 97 %. There is no height or weight on file to calculate BMI.  Demographic Factors:  Male, Caucasian, and Unemployed  Loss Factors: Decline in physical health  Historical Factors: Prior suicide attempts and Family history of mental illness or substance abuse  Risk Reduction Factors:   Positive therapeutic relationship and Positive coping skills or problem solving skills  Continued Clinical Symptoms:  Alcohol/Substance Abuse/Dependencies Schizoaffective Chronic Pain Previous Psychiatric Diagnoses and Treatments Medical Diagnoses and Treatments/Surgeries  Cognitive Features That Contribute To Risk:  Closed-mindedness and Thought constriction (tunnel vision)    Suicide Risk:  Minimal: No identifiable suicidal ideation.  Patients presenting with no risk factors but with morbid ruminations; may be classified as minimal risk based on the severity of the depressive symptoms  Plan Of Care/Follow-up recommendations:  Activity:  As Tolerated Diet:  Heart Healthy  Disposition: Pt discharged to [facility] on 05/02/22 for Substance abuse residential rehab. 30  days samples and paper prescriptions given to Pt.   Prescriptions given at discharge. Patient agreeable to plan.  Given opportunity to ask questions.  Appears to feel comfortable with discharge denies any current suicidal or homicidal thought. Patient is also instructed prior to discharge to: Take all medications as prescribed by his mental healthcare provider. Report any adverse effects and or reactions from the medicines to his outpatient provider promptly. Patient has been instructed & cautioned: To not engage in alcohol and or illegal drug use while on prescription medicines. In the event of worsening symptoms, patient is instructed to call the crisis hotline, 911 and or go to the nearest ED for appropriate evaluation and treatment of symptoms. To follow-up with his/her primary care provider for your other medical issues, concerns and or health care needs.   Armando Reichert, MD 05/01/2022, 3:20 PM

## 2022-05-01 NOTE — ED Notes (Signed)
Patient denies SI/HI and AVH. Patient is eating breakfast and interacting with staff and other residents. Patient is being monitored for safety.

## 2022-05-01 NOTE — ED Notes (Signed)
Patient observed/assessed in day room watching movie. Patient presents as pleasant, with a flat affect, alert and oriented x 4. Speech low and slow bu WNL. Eye contact appropriate. Patient denied feelings of anxiety or pain. Denied A/V/H. Denied any thoughts/plan of self harm and states that his last thoughts were years ago. He requested Trazodone PRN with his night-time medications  explaining that he slept approximately 7 hours and experienced the "Best sleep he had experienced in years" the night before after taking it. He denied thoughts/plan to harm anyone else. Will continue to monitor and support.

## 2022-05-01 NOTE — ED Notes (Signed)
Group handout given, pt verbalized understanding it

## 2022-05-01 NOTE — ED Notes (Signed)
Snacks given 

## 2022-05-01 NOTE — ED Notes (Signed)
Patient observed/assessed in bedroom in bed resting quietly appearing with no distress and verbalizing no complaints at this time. Will continue to monitor.

## 2022-05-01 NOTE — ED Notes (Signed)
Patient observed in day room watching TV. Fluids and Snacks offered. Patient verbalizes no complaints at this time and appears in no distress. Will continue to monitor.

## 2022-05-02 DIAGNOSIS — Z1152 Encounter for screening for COVID-19: Secondary | ICD-10-CM | POA: Diagnosis not present

## 2022-05-02 DIAGNOSIS — F250 Schizoaffective disorder, bipolar type: Secondary | ICD-10-CM | POA: Diagnosis not present

## 2022-05-02 DIAGNOSIS — F4310 Post-traumatic stress disorder, unspecified: Secondary | ICD-10-CM | POA: Diagnosis not present

## 2022-05-02 DIAGNOSIS — F1910 Other psychoactive substance abuse, uncomplicated: Secondary | ICD-10-CM | POA: Diagnosis not present

## 2022-05-02 DIAGNOSIS — F1410 Cocaine abuse, uncomplicated: Secondary | ICD-10-CM | POA: Diagnosis not present

## 2022-05-02 MED FILL — Budesonide-Glycopyrrolate-Formoterol Aers 160-9-4.8 MCG/ACT: RESPIRATORY_TRACT | Qty: 5.9 | Status: AC

## 2022-05-02 NOTE — ED Notes (Signed)
Patient A&Ox4. Patient denies SI/HI and AVH. Patient denies any physical complaints when asked. No acute distress noted. Support and encouragement provided. Routine safety checks conducted according to facility protocol. Encouraged patient to notify staff if thoughts of harm toward self or others arise. Patient verbalize understanding and agreement. Will continue to monitor for safety.    

## 2022-05-02 NOTE — ED Notes (Signed)
Pt asleep in bed. Respirations even and unlabored. Monitoring for safety. 

## 2022-05-02 NOTE — ED Notes (Signed)
Patient observed/assessed in bed in room resting quietly appearing with no distress and verbalizing no complaints at this time. Will continue to monitor.

## 2022-05-03 ENCOUNTER — Telehealth: Payer: Self-pay | Admitting: Emergency Medicine

## 2022-05-03 NOTE — Telephone Encounter (Signed)
Copied from [place] [phone]. Topic: General - Other >> May 02, 2022  9:11 AM Chapman Fitch wrote: Reason for CRM: Pts mom called to cancel his appt this week / the appt was already cancelled / pt would like Zelda to know that he is in Rehab and went in 10.3.23/ please advise

## 2022-05-04 ENCOUNTER — Ambulatory Visit: Payer: Medicaid Other | Admitting: Nurse Practitioner

## 2022-05-09 ENCOUNTER — Ambulatory Visit: Payer: Medicaid Other | Admitting: Gastroenterology

## 2022-05-19 ENCOUNTER — Telehealth: Payer: Self-pay | Admitting: Internal Medicine

## 2022-05-19 NOTE — Telephone Encounter (Signed)
Good Morning Dr. Lorenso Courier,  We received a referral on this patient to be seen for abdominal pain and constipation. The patient has GI hx at [facility] and is requesting this transfer because he currently drives a scooter and [place] is too far for him to drive. He also states he has had issues with drug abuse but says he has been in rehab and is clean. He is requesting to have you as his provider.  Records are available for review in Epic. Please advise on scheduling. Thank you.

## 2022-05-23 NOTE — Telephone Encounter (Signed)
Patient made aware of decision.

## 2022-05-29 ENCOUNTER — Encounter (HOSPITAL_COMMUNITY): Payer: Self-pay

## 2022-05-29 ENCOUNTER — Emergency Department (HOSPITAL_COMMUNITY)
Admission: EM | Admit: 2022-05-29 | Discharge: 2022-05-29 | Disposition: A | Discharge status: Home / Self Care | Payer: Medicaid Other | Attending: Emergency Medicine | Admitting: Emergency Medicine

## 2022-05-29 DIAGNOSIS — Z716 Tobacco abuse counseling: Secondary | ICD-10-CM | POA: Diagnosis not present

## 2022-05-29 DIAGNOSIS — G8929 Other chronic pain: Secondary | ICD-10-CM | POA: Diagnosis not present

## 2022-05-29 DIAGNOSIS — M5442 Lumbago with sciatica, left side: Secondary | ICD-10-CM | POA: Diagnosis not present

## 2022-05-29 DIAGNOSIS — F17200 Nicotine dependence, unspecified, uncomplicated: Secondary | ICD-10-CM | POA: Diagnosis not present

## 2022-05-29 DIAGNOSIS — M545 Low back pain, unspecified: Secondary | ICD-10-CM | POA: Diagnosis present

## 2022-05-29 DIAGNOSIS — Z7902 Long term (current) use of antithrombotics/antiplatelets: Secondary | ICD-10-CM | POA: Insufficient documentation

## 2022-05-29 DIAGNOSIS — Z7982 Long term (current) use of aspirin: Secondary | ICD-10-CM | POA: Insufficient documentation

## 2022-05-29 MED ORDER — IBUPROFEN 800 MG PO TABS: 800.0000 mg | ORAL_TABLET | Freq: Once | ORAL | Status: DC

## 2022-05-29 MED ORDER — IBUPROFEN 600 MG PO TABS: 600.0000 mg | ORAL_TABLET | Freq: Four times a day (QID) | ORAL | 0 refills | Status: DC | PRN

## 2022-05-29 MED ORDER — PREDNISONE 20 MG PO TABS: ORAL_TABLET | ORAL | 0 refills | Status: DC

## 2022-05-29 MED ORDER — CYCLOBENZAPRINE HCL 10 MG PO TABS: 5.0000 mg | ORAL_TABLET | Freq: Once | ORAL | Status: DC

## 2022-05-29 MED ORDER — CYCLOBENZAPRINE HCL 10 MG PO TABS: 10.0000 mg | ORAL_TABLET | Freq: Two times a day (BID) | ORAL | 0 refills | Status: DC | PRN

## 2022-05-29 NOTE — ED Triage Notes (Addendum)
Pt arrived via POV, c/o worsening hip pain. States chronic pain, states is it not time for normal injection yet and has been taken off normal pain meds.

## 2022-05-29 NOTE — ED Provider Notes (Signed)
[facility] DEPT Provider Note   CSN: [id number] Arrival date & time: 05/29/22  1419     History  Chief Complaint  Patient presents with   Hip Pain    Keith Gibbs is a 61 y.o. male.  The history is provided by the patient and medical records. No language interpreter was used.  Hip Pain     61 year old male significant history of chronic back pain, polysubstance abuse, degenerative disc disease who presents in complaining of back pain.  Patient states he has had chronic back pain for many years.  He was on Suboxone, and recently he has been receiving spinal injections by a specialist at another clinic for his back pain which did help however his last injection in October of this year did not provide adequate relief.  He tried calling the office but states he will not receive another injection until December.  He is having persistent sharp stabbing pain to his low back radiates down to his left leg that felt similar to prior back pain.  No report of any fever bowel bladder incontinence or saddle anesthesia.  No history of IV drug use.  No specific treatment tried at home.  Home Medications Prior to Admission medications   Medication Sig Start Date End Date Taking? Authorizing Provider  albuterol (VENTOLIN HFA) 108 (90 Base) MCG/ACT inhaler Inhale 2 puffs into the lungs every 6 (six) hours as needed for wheezing or shortness of breath. 05/28/19   Charlott Rakes, MD  aspirin EC 81 MG tablet Take 1 tablet (81 mg total) by mouth daily. Swallow whole. 01/03/22   Gildardo Pounds, NP  atorvastatin (LIPITOR) 80 MG tablet Take 1 tablet (80 mg total) by mouth at bedtime. 05/01/22   Armando Reichert, MD  clopidogrel (PLAVIX) 75 MG tablet Take 1 tablet (75 mg total) by mouth daily. 05/01/22   Armando Reichert, MD  diclofenac Sodium (VOLTAREN) 1 % GEL Apply 2 g topically 4 (four) times daily. 05/01/22   Armando Reichert, MD  divalproex (DEPAKOTE) 250 MG DR tablet Take 1 tablet  (250 mg total) by mouth 2 (two) times daily. 05/01/22   Armando Reichert, MD  docusate sodium (COLACE) 100 MG capsule Take 1 capsule (100 mg total) by mouth daily as needed for mild constipation. 05/01/22   Armando Reichert, MD  DULoxetine (CYMBALTA) 60 MG capsule Take 1 capsule (60 mg total) by mouth daily. 05/01/22   Armando Reichert, MD  enalapril (VASOTEC) 2.5 MG tablet Take 1 tablet (2.5 mg total) by mouth 2 (two) times daily. 05/01/22   Armando Reichert, MD  fluticasone (FLONASE) 50 MCG/ACT nasal spray Place 1 spray into both nostrils at bedtime. 05/01/22   Armando Reichert, MD  loratadine (CLARITIN) 10 MG tablet Take 1 tablet (10 mg total) by mouth daily. 05/02/22   Armando Reichert, MD  meloxicam (MOBIC) 7.5 MG tablet Take 1 tablet (7.5 mg total) by mouth daily. 05/02/22   Armando Reichert, MD  Multiple Vitamin (MULTIVITAMIN WITH MINERALS) TABS tablet Take 1 tablet by mouth daily. 05/02/22   Armando Reichert, MD  nitroGLYCERIN (NITROSTAT) 0.3 MG SL tablet Place 1 tablet (0.3 mg total) under the tongue every 5 (five) minutes as needed for chest pain. Maximum of 3 doses daily. 05/01/22   Armando Reichert, MD  omega-3 acid ethyl esters (LOVAZA) 1 g capsule Take 1 capsule (1 g total) by mouth daily. 05/02/22   Armando Reichert, MD  pantoprazole (PROTONIX) 20 MG tablet Take 1 tablet (20 mg total) by  mouth daily for 15 days. 05/01/22 05/16/22  Armando Reichert, MD  rOPINIRole (REQUIP) 2 MG tablet Take 1 tablet (2 mg total) by mouth at bedtime. TAKE 1 TABLET BY MOUTH EVERYDAY AT BEDTIME Strength: 2 mg 05/01/22   Armando Reichert, MD  tamsulosin (FLOMAX) 0.4 MG CAPS capsule Take 1 capsule (0.4 mg total) by mouth daily. 05/01/22   Armando Reichert, MD  thiamine (VITAMIN B-1) 100 MG tablet Take 1 tablet (100 mg total) by mouth daily. 05/02/22   Armando Reichert, MD  traZODone (DESYREL) 50 MG tablet Take 1 tablet (50 mg total) by mouth at bedtime as needed for sleep. 05/01/22   Armando Reichert, MD      Allergies    Anette Guarneri [lurasidone hcl] and Saphris  [asenapine]    Review of Systems   Review of Systems  All other systems reviewed and are negative.   Physical Exam Updated Vital Signs BP 132/76 (BP Location: Left Arm)   Pulse 60   Temp 98.7 F (37.1 C) (Oral)   Resp 16   SpO2 100%  Physical Exam Vitals and nursing note reviewed.  Constitutional:      General: He is not in acute distress.    Appearance: He is well-developed.     Comments: Appears uncomfortable  HENT:     Head: Atraumatic.  Eyes:     Conjunctiva/sclera: Conjunctivae normal.  Abdominal:     Palpations: Abdomen is soft.     Tenderness: There is no abdominal tenderness.  Musculoskeletal:        General: Tenderness (Tenderness along lumbar and left paralumbar spinal muscle or with positive blood straight leg raise.  intact distal pedal pulses.) present.     Cervical back: Neck supple.  Skin:    Findings: No rash.  Neurological:     Mental Status: He is alert.     Comments: Able to ambulate.     ED Results / Procedures / Treatments   Labs (all labs ordered are listed, but only abnormal results are displayed) Labs Reviewed - No data to display  EKG None  Radiology No results found.  Procedures Procedures    Medications Ordered in ED Medications  ibuprofen (ADVIL) tablet 800 mg (has no administration in time range)  cyclobenzaprine (FLEXERIL) tablet 5 mg (has no administration in time range)    ED Course/ Medical Decision Making/ A&P                           Medical Decision Making Risk Prescription drug management.   BP 132/76 (BP Location: Left Arm)   Pulse 60   Temp 98.7 F (37.1 C) (Oral)   Resp 16   SpO2 100%   47:2 PM 61 year old male significant history of chronic back pain, polysubstance abuse, degenerative disc disease who presents in complaining of back pain.  Patient states he has had chronic back pain for many years.  He was on Suboxone, and recently he has been receiving spinal injections by a specialist at another  clinic for his back pain which did help however his last injection in October of this year did not provide adequate relief.  He tried calling the office but states he will not receive another injection until December.  He is having persistent sharp stabbing pain to his low back radiates down to his left leg that felt similar to prior back pain.  No report of any fever bowel bladder incontinence or saddle anesthesia.  No history of  IV drug use.  No specific treatment tried at home.  On exam, patient is sitting in chair appears uncomfortable.  He has tenderness along his lumbar and paralumbar spinal region on the left side with positive straight leg raise.  He is able to ambulate.  He does not have any red flags.  His pain is chronic in nature.  His vital signs stable.  No fever.  Will provide supportive care, give outpatient referral but otherwise patient is stable for discharge.  I have considered advanced imaging but this is chronic pain in nature and no recent injury therefore imaging is not indicated.  I have low suspicion for infectious cause leading to symptoms.  I have considered patient social determinant of health which includes tobacco use and recommend tobacco cessation.  I have given patient ibuprofen and Flexeril in the ER for symptom control.        Final Clinical Impression(s) / ED Diagnoses Final diagnoses:  Chronic left-sided low back pain with left-sided sciatica    Rx / DC Orders ED Discharge Orders          Ordered    ibuprofen (ADVIL) 600 MG tablet  Every 6 hours PRN        05/29/22 1516    cyclobenzaprine (FLEXERIL) 10 MG tablet  2 times daily PRN        05/29/22 1516    predniSONE (DELTASONE) 20 MG tablet        05/29/22 1516              Domenic Moras, PA-C 05/29/22 1517    Dorie Rank, MD 05/29/22 [phone]

## 2022-06-06 ENCOUNTER — Telehealth: Payer: Self-pay | Admitting: Cardiology

## 2022-06-06 NOTE — Telephone Encounter (Signed)
   Name: Keith Gibbs  DOB: [birth year]  MRN: [id number]  Primary Cardiologist: Carlyle Dolly, MD  Chart reviewed as part of pre-operative protocol coverage. Because of Keith Gibbs's past medical history and time since last visit, he will require a follow-up in-office visit in order to better assess preoperative cardiovascular risk.  Pre-op covering staff: - Please schedule appointment and call patient to inform them. If patient already had an upcoming appointment within acceptable timeframe, please add "pre-op clearance" to the appointment notes so provider is aware. - Please contact requesting surgeon's office via preferred method (i.e, phone, fax) to inform them of need for appointment prior to surgery.  No medications indicated as needing held.   Elgie Collard, PA-C  06/06/2022, 1:22 PM

## 2022-06-06 NOTE — Telephone Encounter (Signed)
   Pre-operative Risk Assessment    Patient Name: Keith Gibbs  DOB: 04-Jun-1961 MRN: [id number]      Request for Surgical Clearance    Procedure:   Lumbar fusion   Date of Surgery:  Clearance TBD                                 Surgeon:  Dr. Duffy Rhody Surgeon's Group or Practice Name:  [facility]  Phone number:  [phone] Fax number:  [phone]   Type of Clearance Requested:   - Medical    Type of Anesthesia:  General    Additional requests/questions:    [name]   06/06/2022, 12:41 PM

## 2022-06-06 NOTE — Telephone Encounter (Signed)
I s/w the pt and he states he does not follow with Dr. Harl Bowie any longer for his cardiac needs. Pt states he follows Dr. Chancy Milroy in [place].   Dr. Neoma Laming, MD Cardiology  [place], [place] [address] Telehealth services available [phone]  I assured the pt that I will send a message the requesting office they will need to reach out to Dr. Chancy Milroy for cardiac clearance.

## 2022-06-10 ENCOUNTER — Telehealth: Payer: Self-pay | Admitting: Emergency Medicine

## 2022-06-10 NOTE — Telephone Encounter (Signed)
Voicemail left to return call

## 2022-06-10 NOTE — Telephone Encounter (Signed)
Copied from [place] [id number]. Topic: General - Inquiry >> Jun 10, 2022 10:04 AM Keith Gibbs wrote: Reason for CRM: pt states he needs appt w/ [facility] asap (next week) for back surgery clearance.  Pt is going to his heart dr today for clearance. Pt wants to know if his heart dr clears him, will [facility] clear him w/out being seen for appt

## 2022-06-10 NOTE — Telephone Encounter (Signed)
Will need to repeat CBC prior to surgery other than that he can be cleared by cardiology

## 2022-06-13 ENCOUNTER — Other Ambulatory Visit: Payer: Self-pay

## 2022-06-13 ENCOUNTER — Ambulatory Visit: Payer: Medicaid Other | Attending: Nurse Practitioner

## 2022-06-13 DIAGNOSIS — Z01818 Encounter for other preprocedural examination: Secondary | ICD-10-CM

## 2022-06-13 NOTE — Telephone Encounter (Signed)
Patient returned call, I gave him message about needing to to do CBC count for surgery clearance.  Willl he be called back to let him know when orders will be put in and he can com in and do this? He sees his heart Dr Today

## 2022-06-13 NOTE — Telephone Encounter (Signed)
Patient aware that he just needs his labs drawn and not appt needed.

## 2022-06-14 LAB — CBC WITH DIFFERENTIAL/PLATELET
Basophils Absolute: 0.1 10*3/uL (ref 0.0–0.2)
Basos: 1 %
EOS (ABSOLUTE): 0.3 10*3/uL (ref 0.0–0.4)
Eos: 4 %
Hematocrit: 40.5 % (ref 37.5–51.0)
Hemoglobin: 13.8 g/dL (ref 13.0–17.7)
Immature Grans (Abs): 0 10*3/uL (ref 0.0–0.1)
Immature Granulocytes: 0 %
Lymphocytes Absolute: 2.8 10*3/uL (ref 0.7–3.1)
Lymphs: 34 %
MCH: 31.7 pg (ref 26.6–33.0)
MCHC: 34.1 g/dL (ref 31.5–35.7)
MCV: 93 fL (ref 79–97)
Monocytes Absolute: 0.4 10*3/uL (ref 0.1–0.9)
Monocytes: 5 %
Neutrophils Absolute: 4.5 10*3/uL (ref 1.4–7.0)
Neutrophils: 56 %
Platelets: 315 10*3/uL (ref 150–450)
RBC: 4.36 x10E6/uL (ref 4.14–5.80)
RDW: 13.3 % (ref 11.6–15.4)
WBC: 8.1 10*3/uL (ref 3.4–10.8)

## 2022-06-21 ENCOUNTER — Ambulatory Visit: Payer: Medicaid Other | Admitting: Family Medicine

## 2022-07-14 NOTE — Telephone Encounter (Signed)
Patient identified by name and date of birth. Aware of lab results from CBC and  will fax clarence when received and signed.

## 2022-07-14 NOTE — Telephone Encounter (Signed)
Pt checking status of surgery clearance  Pt states all other providers have cleared him and he is waiting on his pcp  Pt states he had lab work done but has not received his results  Pt is requesting a cb asap  Please fu w/ pt

## 2022-07-15 NOTE — Telephone Encounter (Signed)
Left voicemail to fax over form for provider to review.

## 2022-07-19 ENCOUNTER — Telehealth: Payer: Self-pay | Admitting: Emergency Medicine

## 2022-07-19 NOTE — Telephone Encounter (Signed)
Copied from [place] [id number]. Topic: General - Other >> Jul 11, 2022 12:47 PM Everette C wrote: Reason for CRM: The patient has called for an update on the completion of their letter for approval for their back surgery   The patient has been in contact with Dr. Marcello Moores' office at [facility] and been notified that they are still awaiting the letter of approval from [facility]. [place]  Please contact the patient further when possible

## 2022-07-19 NOTE — Telephone Encounter (Signed)
Awaiting fax for clearance forms to be sent.

## 2022-07-23 ENCOUNTER — Other Ambulatory Visit: Payer: Self-pay | Admitting: Nurse Practitioner

## 2022-07-23 ENCOUNTER — Other Ambulatory Visit (HOSPITAL_COMMUNITY): Payer: Self-pay | Admitting: Psychiatry

## 2022-07-23 ENCOUNTER — Other Ambulatory Visit: Payer: Self-pay | Admitting: Family Medicine

## 2022-07-23 DIAGNOSIS — G2581 Restless legs syndrome: Secondary | ICD-10-CM

## 2022-07-23 DIAGNOSIS — M545 Low back pain, unspecified: Secondary | ICD-10-CM

## 2022-07-23 DIAGNOSIS — F250 Schizoaffective disorder, bipolar type: Secondary | ICD-10-CM
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
## 2022-07-26 NOTE — Telephone Encounter (Signed)
Requested medication (s) are due for refill today:   Not sure  Requested medication (s) are on the active medication list:   Yes as "Print" and as discontinued  Future visit scheduled:   No   Last ordered: Cymbalta 05/01/2022 #30, 0 refills -by another provider;    Mobic was discontinued 01/03/2022 but on 05/02/2022 #30, 0 given as "Print"   Returned for provider review  Requested Prescriptions  Pending Prescriptions Disp Refills   DULoxetine (CYMBALTA) 60 MG capsule [Pharmacy Med Name: DULOXETINE HCL DR 60 MG CAP] 90 capsule 3    Sig: TAKE 1 CAPSULE BY MOUTH EVERY DAY     There is no refill protocol information for this order     meloxicam (MOBIC) 15 MG tablet [Pharmacy Med Name: MELOXICAM 15 MG TABLET] 30 tablet 0    Sig: TAKE 1 TABLET (15 MG TOTAL) BY MOUTH DAILY.     There is no refill protocol information for this order

## 2022-07-27 ENCOUNTER — Other Ambulatory Visit: Payer: Self-pay | Admitting: Nurse Practitioner

## 2022-08-01 ENCOUNTER — Encounter (HOSPITAL_COMMUNITY): Payer: Self-pay | Admitting: Psychiatry

## 2022-08-01 ENCOUNTER — Other Ambulatory Visit: Payer: Self-pay | Admitting: Neurosurgery

## 2022-08-01 ENCOUNTER — Telehealth (INDEPENDENT_AMBULATORY_CARE_PROVIDER_SITE_OTHER): Payer: Medicaid Other | Admitting: Psychiatry

## 2022-08-01 DIAGNOSIS — F250 Schizoaffective disorder, bipolar type: Secondary | ICD-10-CM | POA: Diagnosis not present

## 2022-08-01 DIAGNOSIS — F411 Generalized anxiety disorder: Secondary | ICD-10-CM | POA: Diagnosis not present

## 2022-08-01 MED ORDER — BUSPIRONE HCL 15 MG PO TABS: 15.0000 mg | ORAL_TABLET | Freq: Every day | ORAL | 3 refills | Status: DC

## 2022-08-01 MED ORDER — DIVALPROEX SODIUM 500 MG PO DR TAB: 500.0000 mg | DELAYED_RELEASE_TABLET | Freq: Two times a day (BID) | ORAL | 3 refills | Status: DC

## 2022-08-01 MED ORDER — OLANZAPINE 5 MG PO TABS: 5.0000 mg | ORAL_TABLET | Freq: Every day | ORAL | 3 refills | Status: DC

## 2022-08-01 NOTE — Progress Notes (Signed)
BH MD/PA/NP OP Progress Note Virtual Visit via Video Note  I connected with Keith Gibbs on 08/01/22 at  8:00 AM EST by a video enabled telemedicine application and verified that I am speaking with the correct person using two identifiers.  Location: Patient: Home Provider: Clinic   I discussed the limitations of evaluation and management by telemedicine and the availability of in person appointments. The patient expressed understanding and agreed to proceed.  I provided 30 minutes of non-face-to-face time during this encounter.   08/01/2022 alcohol, 9:03 AM Keith Gibbs  MRN:  [id number]  Chief Complaint: " Everything is chaotic"  HPI: 62 year old male seen today for follow-up psychiatric evaluation.  He has a psychiatric history of schizoaffective disorder, bipolar disorder, depression, polysubstance use (heroin, cocaine, alcohol, marijuana),anxiety, and tobacco dependence.  Currently he is managed on Zoloft 25 mg daily, Seroquel 200 mg nightly, and Depakote to 250 mg twice daily.  Patient was also prescribed Cymbalta 60 m daily, gabapentin 300 mg 3 times daily, and trazodone 50 mg nightly as needed which he gets from his PCP and pain specialist.  He reports his medications are somewhat effective in managing his psychiatric condition.  Patient reports no longer taking Seroquel.  Today he is well-groomed, pleasant, cooperative, and engaged in conversation.  He informed Probation officer that since his last visit things has been chaotic.  He reports that everything he touches crumbles.  Patient notes that he has poor self-esteem and grieves his wife and daughter who both passed away 3 years ago.  Patient informed Probation officer that he is now living with his mother who reports is supportive but nagging.  Patient also informed Probation officer that he continues to struggle with substance use.  He notes that a few days ago he used marijuana and cocaine.  Patient endorses symptoms of hypomania such as irritability,  distractibility, racing thoughts, fluctuations in mood, and increased energy.  Patient informed writer's increase activity makes him lose weight.  He also notes that he has a poor appetite.   Patient reports that most days he is anxious.  He notes that some of his anxiety comes from his paranoia.  He notes that when he is driving his scooter he feels that someone hit him.  He also notes that he feels that someone is watching him.  At times patient endorses auditory hallucinations but denies visual hallucinations.  Today provider conducted a GAD-7 and patient scored a 20.  Provider also conducted PHQ-9 and patient scored a 22.  He endorses poor sleep noting that he sleeps 2 hours nightly.  Patient informed Probation officer that he is in constant pain.  He notes that his back and legs hurt.  He reports that he has to have surgery on his back.  Patient notes that gabapentin and Cymbalta are somewhat effective in managing his pain.  Patient informed Probation officer that he discontinued Seroquel.  He notes that it caused him to have shakes.  He also informed Probation officer that he found BuSpar effective in the past however has not been prescribed it in months.  Today Zoloft increased to avoid worsening mania.  Patient was agreeable to restarting BuSpar 15 mg twice daily to help manage symptoms of anxiety and depression.  He is also agreeable to start Zyprexa 5 mg nightly to help manage sleep, mood, and psychosis.  Depakote 250 mg twice daily increase to 500 mg twice daily to help manage mood.  Patient will follow-up with outpatient counseling for therapy.  No other concerns at this  time.  Visit Diagnosis:    ICD-10-CM   1. Generalized anxiety disorder  F41.1 busPIRone (BUSPAR) 15 MG tablet    2. Schizoaffective disorder, bipolar type (HCC)  F25.0 OLANZapine (ZYPREXA) 5 MG tablet    divalproex (DEPAKOTE) 500 MG DR tablet      Past Psychiatric History: Schizoaffective disorder, bipolar disorder, anxiety, depression, polysubstance  use (heroin, alcohol, marijuana, tobacco, and cocaine)  Past Medical History:  Past Medical History:  Diagnosis Date   Anginal pain ([place])    Anxiety    Arthritis    Bipolar 1 disorder ([place])    Bursitis    CAD (coronary artery disease)    Chronic pain    COPD (chronic obstructive pulmonary disease) (HCC)    Current use of long term anticoagulation    DAPT (ASA + clopidogrel)   Depression    Diverticulitis    Dyspnea    GERD (gastroesophageal reflux disease)    Grade I diastolic dysfunction    Hepatitis C 2012   No longer has Hep C   HLD (hyperlipidemia)    Hypertension    MI (myocardial infarction) ([place])    Polysubstance abuse ([place])    cocaine, marijuana, ETOH   PUD (peptic ulcer disease)    S/P angioplasty with stent 06/10/2016   a.) 90% stenosis of pLAD to mLAD - 2.5 x 18 mm Xience Alpine (DES x 1) placed to pLAD   S/P PTCA (percutaneous transluminal coronary angioplasty) 12/04/2019   a.) 60% in stent restenosis of DES to pLAD; LVEF 65%.   Schizophrenia ([place])    Stroke [facility])    Valvular insufficiency    a.) Mild MR, TR, PR; mild to moderate AR on 03/05/2018 TTE    Past Surgical History:  Procedure Laterality Date   ABDOMINAL SURGERY     removed small piece of intestines due to [facility] Diverticulosis   APPENDECTOMY     BACK SURGERY     CARDIAC CATHETERIZATION Left 06/10/2016   Procedure: Left Heart Cath and Coronary Angiography;  Surgeon: Dionisio David, MD;  Location: [place] CV LAB;  Service: Cardiovascular;  Laterality: Left;   CARDIAC CATHETERIZATION N/A 06/10/2016   Procedure: Coronary Stent Intervention;  Surgeon: Yolonda Kida, MD;  Location: [place] CV LAB;  Service: Cardiovascular;  Laterality: N/A;   CHOLECYSTECTOMY N/A 02/17/2022   Procedure: LAPAROSCOPIC CHOLECYSTECTOMY;  Surgeon: Kieth Brightly Arta Bruce, MD;  Location: [place];  Service: General;  Laterality: N/A;   COLON SURGERY     COLONOSCOPY     COLONOSCOPY WITH PROPOFOL N/A 01/05/2017    Procedure: COLONOSCOPY WITH PROPOFOL;  Surgeon: Jonathon Bellows, MD;  Location: [facility] ENDOSCOPY;  Service: Endoscopy;  Laterality: N/A;   COLONOSCOPY WITH PROPOFOL N/A 02/13/2020   Procedure: COLONOSCOPY WITH PROPOFOL;  Surgeon: Virgel Manifold, MD;  Location: [facility] ENDOSCOPY;  Service: Endoscopy;  Laterality: N/A;   CORONARY ANGIOPLASTY WITH STENT PLACEMENT     ESOPHAGOGASTRODUODENOSCOPY (EGD) WITH PROPOFOL N/A 01/05/2017   Procedure: ESOPHAGOGASTRODUODENOSCOPY (EGD) WITH PROPOFOL;  Surgeon: Jonathon Bellows, MD;  Location: [facility] ENDOSCOPY;  Service: Endoscopy;  Laterality: N/A;   ESOPHAGOGASTRODUODENOSCOPY (EGD) WITH PROPOFOL N/A 02/13/2020   Procedure: ESOPHAGOGASTRODUODENOSCOPY (EGD) WITH PROPOFOL;  Surgeon: Virgel Manifold, MD;  Location: [facility] ENDOSCOPY;  Service: Endoscopy;  Laterality: N/A;   INTRAVASCULAR PRESSURE WIRE/FFR STUDY N/A 12/04/2019   Procedure: INTRAVASCULAR PRESSURE WIRE/FFR STUDY;  Surgeon: Sherren Mocha, MD;  Location: [place] CV LAB;  Service: Cardiovascular;  Laterality: N/A;   KNEE ARTHROSCOPY WITH MEDIAL MENISECTOMY Right 09/05/2017  Procedure: KNEE ARTHROSCOPY WITH MEDIAL AND LATERAL  MENISECTOMY PARTIAL SYNOVECTOMY;  Surgeon: Hessie Knows, MD;  Location: [facility] ORS;  Service: Orthopedics;  Laterality: Right;   LEFT HEART CATH AND CORONARY ANGIOGRAPHY N/A 12/04/2019   Procedure: LEFT HEART CATH AND CORONARY ANGIOGRAPHY;  Surgeon: Sherren Mocha, MD;  Location: [place] CV LAB;  Service: Cardiovascular;  Laterality: N/A;   SHOULDER SURGERY Right 04/09/2012   SPINE SURGERY     TOTAL KNEE ARTHROPLASTY Right 01/19/2021   Procedure: TOTAL KNEE ARTHROPLASTY - Rachelle Hora to Assist;  Surgeon: Hessie Knows, MD;  Location: [facility] ORS;  Service: Orthopedics;  Laterality: Right;    Family Psychiatric History: Sister depression, paternal grandmother anxiety and depression, mother depression, maternal family members alcohol use, maternal aunt schizophrenia, paternal aunt  anxiety and depression  Family History:  Family History  Problem Relation Age of Onset   Osteoarthritis Mother    Heart disease Mother    Hypertension Mother    Depression Mother    Heart disease Father    Early death Father    Hypertension Father    Heart attack Father    Hypertension Sister    Prostate cancer Neg Hx    Bladder Cancer Neg Hx    Kidney cancer Neg Hx     Social History:  Social History   Socioeconomic History   Marital status: Widowed    Spouse name: Not on file   Number of children: Not on file   Years of education: Not on file   Highest education level: Not on file  Occupational History   Occupation: disability   Occupation: stocking at gas station    Comment: 2 days per week  Tobacco Use   Smoking status: Some Days    Packs/day: 0.50    Years: 32.00    Total pack years: 16.00    Types: Cigarettes    Last attempt to quit: 10/25/2021    Years since quitting: 0.7   Smokeless tobacco: Never   Tobacco comments:    Continues to smoke approximately 5 cigarettes per day.  Has been 2 days since las cigarette.  Has not bought any more.  Using patches and gum.  5/23 hfb  Vaping Use   Vaping Use: Never used  Substance and Sexual Activity   Alcohol use: Yes    Alcohol/week: 6.0 standard drinks of alcohol    Types: 6 Cans of beer per week    Comment: occassionally    Drug use: Not Currently    Types: Cocaine, Marijuana    Comment: last on saturday   Sexual activity: Yes    Partners: Female    Birth control/protection: None  Other Topics Concern   Not on file  Social History Narrative   Not on file   Social Determinants of Health   Financial Resource Strain: Not on file  Food Insecurity: Not on file  Transportation Needs: Not on file  Physical Activity: Not on file  Stress: Not on file  Social Connections: Not on file    Allergies:  Allergies  Allergen Reactions   Latuda [Lurasidone Hcl] Other (See Comments)    Tremors     Saphris  [Asenapine] Other (See Comments)    Increased tremors     Metabolic Disorder Labs: Lab Results  Component Value Date   HGBA1C 5.2 04/26/2022   MPG 102.54 04/26/2022   MPG 103 12/28/2016   No results found for: "PROLACTIN" Lab Results  Component Value Date   CHOL 83 04/26/2022  TRIG 52 04/26/2022   HDL 28 (L) 04/26/2022   CHOLHDL 3.0 04/26/2022   VLDL 10 04/26/2022   LDLCALC 45 04/26/2022   LDLCALC 41 08/31/2021   Lab Results  Component Value Date   TSH 0.590 04/26/2022   TSH 1.610 09/08/2020    Therapeutic Level Labs: No results found for: "LITHIUM" Lab Results  Component Value Date   VALPROATE 22 (L) 04/26/2022   VALPROATE 18 (L) 10/07/2021   No results found for: "CBMZ"  Current Medications: Current Outpatient Medications  Medication Sig Dispense Refill   busPIRone (BUSPAR) 15 MG tablet Take 1 tablet (15 mg total) by mouth daily. 60 tablet 3   OLANZapine (ZYPREXA) 5 MG tablet Take 1 tablet (5 mg total) by mouth at bedtime. 30 tablet 3   albuterol (VENTOLIN HFA) 108 (90 Base) MCG/ACT inhaler Inhale 2 puffs into the lungs every 6 (six) hours as needed for wheezing or shortness of breath. 18 g 6   aspirin EC 81 MG tablet Take 1 tablet (81 mg total) by mouth daily. Swallow whole. 30 tablet 12   atorvastatin (LIPITOR) 80 MG tablet Take 1 tablet (80 mg total) by mouth at bedtime. 30 tablet 0   cyclobenzaprine (FLEXERIL) 10 MG tablet Take 1 tablet (10 mg total) by mouth 2 (two) times daily as needed for muscle spasms. 20 tablet 0   diclofenac Sodium (VOLTAREN) 1 % GEL Apply 2 g topically 4 (four) times daily. 2 g 0   divalproex (DEPAKOTE) 500 MG DR tablet Take 1 tablet (500 mg total) by mouth 2 (two) times daily. 60 tablet 3   docusate sodium (COLACE) 100 MG capsule Take 1 capsule (100 mg total) by mouth daily as needed for mild constipation. 10 capsule 0   DULoxetine (CYMBALTA) 60 MG capsule Take 1 capsule (60 mg total) by mouth daily. 30 capsule 0   enalapril  (VASOTEC) 2.5 MG tablet Take 1 tablet (2.5 mg total) by mouth 2 (two) times daily. 60 tablet 0   fluticasone (FLONASE) 50 MCG/ACT nasal spray Place 1 spray into both nostrils at bedtime. 9.9 mL 0   ibuprofen (ADVIL) 600 MG tablet Take 1 tablet (600 mg total) by mouth every 6 (six) hours as needed. 30 tablet 0   loratadine (CLARITIN) 10 MG tablet Take 1 tablet (10 mg total) by mouth daily. 30 tablet 0   meloxicam (MOBIC) 7.5 MG tablet Take 1 tablet (7.5 mg total) by mouth daily. 30 tablet 0   Multiple Vitamin (MULTIVITAMIN WITH MINERALS) TABS tablet Take 1 tablet by mouth daily. 30 tablet 0   nitroGLYCERIN (NITROSTAT) 0.3 MG SL tablet Place 1 tablet (0.3 mg total) under the tongue every 5 (five) minutes as needed for chest pain. Maximum of 3 doses daily. 90 tablet 0   omega-3 acid ethyl esters (LOVAZA) 1 g capsule Take 1 capsule (1 g total) by mouth daily. 30 capsule 0   pantoprazole (PROTONIX) 20 MG tablet Take 1 tablet (20 mg total) by mouth daily for 15 days. 15 tablet 0   predniSONE (DELTASONE) 20 MG tablet 3 tabs po day one, then 2 tabs daily x 4 days 11 tablet 0   rOPINIRole (REQUIP) 2 MG tablet Take 1 tablet (2 mg total) by mouth at bedtime. TAKE 1 TABLET BY MOUTH EVERYDAY AT BEDTIME Strength: 2 mg 30 tablet 0   tamsulosin (FLOMAX) 0.4 MG CAPS capsule Take 1 capsule (0.4 mg total) by mouth daily. 30 capsule 0   thiamine (VITAMIN B-1) 100 MG tablet Take  1 tablet (100 mg total) by mouth daily. 30 tablet 0   traZODone (DESYREL) 50 MG tablet Take 1 tablet (50 mg total) by mouth at bedtime as needed for sleep. 30 tablet 0   No current facility-administered medications for this visit.     Musculoskeletal: Strength & Muscle Tone: within normal limits and Telehealth visit Gait & Station: normal, telehealth visit Patient leans:  N/A Psychiatric Specialty Exam: Review of Systems  There were no vitals taken for this visit.There is no height or weight on file to calculate BMI.  General  Appearance: Well Groomed  Eye Contact:  Good  Speech:  Clear and Coherent and Normal Rate  Volume:  Normal  Mood:  Anxious and Depressed  Affect:  Appropriate and Congruent  Thought Process:  Coherent, Goal Directed, and Linear  Orientation:  Full (Time, Place, and Person)  Thought Content: Logical, Hallucinations: Auditory, and Paranoid Ideation   Suicidal Thoughts:  Yes.  without intent/plan  Homicidal Thoughts:  No  Memory:  Immediate;   Good Recent;   Good Remote;   Good  Judgement:  Good  Insight:  Good  Psychomotor Activity:  Normal  Concentration:  Concentration: Good and Attention Span: Good  Recall:  Good  Fund of Knowledge: Good  Language: Good  Akathisia:  No  Handed:  Right  AIMS (if indicated): not done  Assets:  Communication Skills Desire for Improvement Financial Resources/Insurance Housing Physical Health Social Support  ADL's:  Intact  Cognition: WNL  Sleep:  Poor   Screenings: AIMS    Flowsheet Row Admission (Discharged) from 12/27/2016 in [place] Total Score 1      AUDIT    Flowsheet Row Admission (Discharged) from 12/27/2016 in [place]  Alcohol Use Disorder Identification Test Final Score (AUDIT) 32      GAD-7    Flowsheet Row Video Visit from 08/01/2022 in [place] from 06/03/2019 in [place]  Total GAD-7 Score 20 14      PHQ2-9    Flowsheet Row Video Visit from 08/01/2022 in [facility] ED from 04/26/2022 in [facility] Office Visit from 01/03/2022 in [place] Office Visit from 08/31/2021 in [place] Visit from 06/21/2021 in [place]  PHQ-2 Total Score '6 1 2 '$ 0 2  PHQ-9 Total Score '22 3 5 1 7      '$ Flowsheet Row Video Visit from 08/01/2022 in [facility] ED from 05/29/2022 in [place] DEPT ED from 04/26/2022 in [place] Error: Q7 should not be populated when Q6 is No No Risk No Risk        Assessment and Plan: Patient endorses symptoms of anxiety, depression, insomnia, and hypomania.  He notes that he discontinued Seroquel and does not want to restart it due to it causing him to shake.  He is agreeable to restarting BuSpar 15 mg twice daily as he notes it helps manage his anxiety in the past.  He is also agreeable to starting Zyprexa 5 mg nightly to help manage psychosis, depression,sleep.  Depakote 250 mg twice daily increase to 500 mg twice daily to help manage mood.  1. Schizoaffective disorder, bipolar type ([place])  Start- OLANZapine (ZYPREXA) 5 MG tablet; Take 1 tablet (5 mg total) by mouth at bedtime.  Dispense:  30 tablet; Refill: 3 Increased- divalproex (DEPAKOTE) 500 MG DR tablet; Take 1 tablet (500 mg total) by mouth 2 (two) times daily.  Dispense: 60 tablet; Refill: 3  2. Generalized anxiety disorder  Restart- busPIRone (BUSPAR) 15 MG tablet; Take 1 tablet (15 mg total) by mouth daily.  Dispense: 60 tablet; Refill: 3   Collaboration of Care: Collaboration of Care: Other provider involved in patient's care [place] counselor, PCP, primary psychiatric provider  Patient/Guardian was advised Release of Information must be obtained prior to any record release in order to collaborate their care with an outside provider. Patient/Guardian was advised if they have not already done so to contact the registration department to sign all necessary forms in order for [place] to release information regarding their care.   Consent: Patient/Guardian gives verbal consent for treatment and assignment of benefits for services provided during this visit. Patient/Guardian expressed understanding and agreed to proceed.   Follow-up with primary psychiatric  provider in 3 months Follow-up with counseling   Salley Slaughter, NP 08/01/2022, 9:03 AM

## 2022-08-03 ENCOUNTER — Other Ambulatory Visit: Payer: Self-pay

## 2022-08-03 ENCOUNTER — Emergency Department (HOSPITAL_COMMUNITY): Payer: Medicaid Other

## 2022-08-03 ENCOUNTER — Inpatient Hospital Stay (HOSPITAL_COMMUNITY)
Admission: EM | Admit: 2022-08-03 | Discharge: 2022-08-05 | DRG: 917 | Disposition: A | Discharge status: Other Acute Inpatient Hospital | Payer: Medicaid Other | Attending: Internal Medicine | Admitting: Internal Medicine

## 2022-08-03 ENCOUNTER — Ambulatory Visit: Payer: Medicaid Other | Admitting: Family Medicine

## 2022-08-03 DIAGNOSIS — F17210 Nicotine dependence, cigarettes, uncomplicated: Secondary | ICD-10-CM | POA: Diagnosis present

## 2022-08-03 DIAGNOSIS — Z608 Other problems related to social environment: Secondary | ICD-10-CM | POA: Diagnosis present

## 2022-08-03 DIAGNOSIS — G8929 Other chronic pain: Secondary | ICD-10-CM | POA: Diagnosis present

## 2022-08-03 DIAGNOSIS — I10 Essential (primary) hypertension: Secondary | ICD-10-CM | POA: Diagnosis present

## 2022-08-03 DIAGNOSIS — J449 Chronic obstructive pulmonary disease, unspecified: Secondary | ICD-10-CM | POA: Diagnosis present

## 2022-08-03 DIAGNOSIS — Z8673 Personal history of transient ischemic attack (TIA), and cerebral infarction without residual deficits: Secondary | ICD-10-CM

## 2022-08-03 DIAGNOSIS — R451 Restlessness and agitation: Secondary | ICD-10-CM | POA: Diagnosis not present

## 2022-08-03 DIAGNOSIS — Z1152 Encounter for screening for COVID-19: Secondary | ICD-10-CM | POA: Diagnosis not present

## 2022-08-03 DIAGNOSIS — E785 Hyperlipidemia, unspecified: Secondary | ICD-10-CM | POA: Diagnosis present

## 2022-08-03 DIAGNOSIS — Z8249 Family history of ischemic heart disease and other diseases of the circulatory system: Secondary | ICD-10-CM | POA: Diagnosis not present

## 2022-08-03 DIAGNOSIS — Z79899 Other long term (current) drug therapy: Secondary | ICD-10-CM | POA: Diagnosis not present

## 2022-08-03 DIAGNOSIS — G9340 Encephalopathy, unspecified: Secondary | ICD-10-CM | POA: Diagnosis not present

## 2022-08-03 DIAGNOSIS — N400 Enlarged prostate without lower urinary tract symptoms: Secondary | ICD-10-CM | POA: Diagnosis present

## 2022-08-03 DIAGNOSIS — Z9151 Personal history of suicidal behavior: Secondary | ICD-10-CM | POA: Diagnosis not present

## 2022-08-03 DIAGNOSIS — T43592A Poisoning by other antipsychotics and neuroleptics, intentional self-harm, initial encounter: Principal | ICD-10-CM | POA: Diagnosis present

## 2022-08-03 DIAGNOSIS — F250 Schizoaffective disorder, bipolar type: Secondary | ICD-10-CM | POA: Diagnosis present

## 2022-08-03 DIAGNOSIS — F259 Schizoaffective disorder, unspecified: Secondary | ICD-10-CM | POA: Diagnosis present

## 2022-08-03 DIAGNOSIS — Z955 Presence of coronary angioplasty implant and graft: Secondary | ICD-10-CM

## 2022-08-03 DIAGNOSIS — I2510 Atherosclerotic heart disease of native coronary artery without angina pectoris: Secondary | ICD-10-CM | POA: Diagnosis present

## 2022-08-03 DIAGNOSIS — Z818 Family history of other mental and behavioral disorders: Secondary | ICD-10-CM | POA: Diagnosis not present

## 2022-08-03 DIAGNOSIS — Z888 Allergy status to other drugs, medicaments and biological substances status: Secondary | ICD-10-CM

## 2022-08-03 DIAGNOSIS — F1490 Cocaine use, unspecified, uncomplicated: Secondary | ICD-10-CM | POA: Diagnosis present

## 2022-08-03 DIAGNOSIS — G929 Unspecified toxic encephalopathy: Secondary | ICD-10-CM | POA: Insufficient documentation

## 2022-08-03 DIAGNOSIS — Z7901 Long term (current) use of anticoagulants: Secondary | ICD-10-CM

## 2022-08-03 DIAGNOSIS — K219 Gastro-esophageal reflux disease without esophagitis: Secondary | ICD-10-CM | POA: Diagnosis present

## 2022-08-03 DIAGNOSIS — F419 Anxiety disorder, unspecified: Secondary | ICD-10-CM | POA: Diagnosis present

## 2022-08-03 DIAGNOSIS — Z791 Long term (current) use of non-steroidal anti-inflammatories (NSAID): Secondary | ICD-10-CM

## 2022-08-03 DIAGNOSIS — G928 Other toxic encephalopathy: Secondary | ICD-10-CM | POA: Diagnosis present

## 2022-08-03 DIAGNOSIS — R45851 Suicidal ideations: Secondary | ICD-10-CM

## 2022-08-03 DIAGNOSIS — Z7982 Long term (current) use of aspirin: Secondary | ICD-10-CM | POA: Diagnosis not present

## 2022-08-03 DIAGNOSIS — Z96651 Presence of right artificial knee joint: Secondary | ICD-10-CM | POA: Diagnosis present

## 2022-08-03 DIAGNOSIS — I252 Old myocardial infarction: Secondary | ICD-10-CM

## 2022-08-03 LAB — CBC WITH DIFFERENTIAL/PLATELET
Abs Immature Granulocytes: 0.03 10*3/uL (ref 0.00–0.07)
Basophils Absolute: 0 10*3/uL (ref 0.0–0.1)
Basophils Relative: 0 %
Eosinophils Absolute: 0.1 10*3/uL (ref 0.0–0.5)
Eosinophils Relative: 1 %
HCT: 41.7 % (ref 39.0–52.0)
Hemoglobin: 14.3 g/dL (ref 13.0–17.0)
Immature Granulocytes: 0 %
Lymphocytes Relative: 14 %
Lymphs Abs: 1.6 10*3/uL (ref 0.7–4.0)
MCH: 33.3 pg (ref 26.0–34.0)
MCHC: 34.3 g/dL (ref 30.0–36.0)
MCV: 97 fL (ref 80.0–100.0)
Monocytes Absolute: 0.5 10*3/uL (ref 0.1–1.0)
Monocytes Relative: 4 %
Neutro Abs: 9.3 10*3/uL — ABNORMAL HIGH (ref 1.7–7.7)
Neutrophils Relative %: 81 %
Platelets: 206 10*3/uL (ref 150–400)
RBC: 4.3 MIL/uL (ref 4.22–5.81)
RDW: 14.3 % (ref 11.5–15.5)
WBC: 11.5 10*3/uL — ABNORMAL HIGH (ref 4.0–10.5)
nRBC: 0 % (ref 0.0–0.2)

## 2022-08-03 LAB — VALPROIC ACID LEVEL: Valproic Acid Lvl: 31 ug/mL — ABNORMAL LOW (ref 50.0–100.0)

## 2022-08-03 LAB — URINALYSIS, ROUTINE W REFLEX MICROSCOPIC
Bilirubin Urine: NEGATIVE
Glucose, UA: NEGATIVE mg/dL
Ketones, ur: 5 mg/dL — AB
Leukocytes,Ua: NEGATIVE
Nitrite: NEGATIVE
Protein, ur: NEGATIVE mg/dL
Specific Gravity, Urine: 1.025 (ref 1.005–1.030)
pH: 5 (ref 5.0–8.0)

## 2022-08-03 LAB — COMPREHENSIVE METABOLIC PANEL
ALT: 18 U/L (ref 0–44)
AST: 27 U/L (ref 15–41)
Albumin: 4.1 g/dL (ref 3.5–5.0)
Alkaline Phosphatase: 55 U/L (ref 38–126)
Anion gap: 11 (ref 5–15)
BUN: 18 mg/dL (ref 8–23)
CO2: 23 mmol/L (ref 22–32)
Calcium: 9.1 mg/dL (ref 8.9–10.3)
Chloride: 104 mmol/L (ref 98–111)
Creatinine, Ser: 0.84 mg/dL (ref 0.61–1.24)
GFR, Estimated: >60 mL/min (ref >60)
Glucose, Bld: 102 mg/dL — ABNORMAL HIGH (ref 70–99)
Potassium: 3.6 mmol/L (ref 3.5–5.1)
Sodium: 138 mmol/L (ref 135–145)
Total Bilirubin: 1 mg/dL (ref 0.3–1.2)
Total Protein: 6.9 g/dL (ref 6.5–8.1)

## 2022-08-03 LAB — ETHANOL: Alcohol, Ethyl (B): <10 mg/dL (ref <10)

## 2022-08-03 LAB — TROPONIN I (HIGH SENSITIVITY)
Troponin I (High Sensitivity): 10 ng/L (ref <18)
Troponin I (High Sensitivity): 11 ng/L (ref <18)

## 2022-08-03 LAB — CK: Total CK: 384 U/L (ref 49–397)

## 2022-08-03 LAB — CBG MONITORING, ED: Glucose-Capillary: 102 mg/dL — ABNORMAL HIGH (ref 70–99)

## 2022-08-03 LAB — RAPID URINE DRUG SCREEN, HOSP PERFORMED
Amphetamines: NOT DETECTED
Barbiturates: NOT DETECTED
Benzodiazepines: POSITIVE — AB
Cocaine: POSITIVE — AB
Opiates: NOT DETECTED
Tetrahydrocannabinol: NOT DETECTED

## 2022-08-03 LAB — TSH: TSH: 0.42 u[IU]/mL (ref 0.350–4.500)

## 2022-08-03 LAB — T4, FREE: Free T4: 0.92 ng/dL (ref 0.61–1.12)

## 2022-08-03 MED ORDER — MELOXICAM 7.5 MG PO TABS
7.5000 mg | ORAL_TABLET | Freq: Every day | ORAL | Status: DC
  Administered 2022-08-04 – 2022-08-05 (×2): 7.5 mg via ORAL
  Filled 2022-08-03 (×2): qty 1

## 2022-08-03 MED ORDER — LORATADINE 10 MG PO TABS
10.0000 mg | ORAL_TABLET | Freq: Every day | ORAL | Status: DC
  Administered 2022-08-04 – 2022-08-05 (×2): 10 mg via ORAL
  Filled 2022-08-03 (×2): qty 1

## 2022-08-03 MED ORDER — BUSPIRONE HCL 10 MG PO TABS
15.0000 mg | ORAL_TABLET | Freq: Every day | ORAL | Status: DC
  Administered 2022-08-04 – 2022-08-05 (×2): 15 mg via ORAL
  Filled 2022-08-03 (×2): qty 2

## 2022-08-03 MED ORDER — ENALAPRIL MALEATE 2.5 MG PO TABS: 2.5000 mg | ORAL_TABLET | Freq: Two times a day (BID) | ORAL | Status: DC

## 2022-08-03 MED ORDER — ENOXAPARIN SODIUM 40 MG/0.4ML IJ SOSY
40.0000 mg | PREFILLED_SYRINGE | INTRAMUSCULAR | Status: DC
  Administered 2022-08-03 – 2022-08-04 (×2): 40 mg via SUBCUTANEOUS
  Filled 2022-08-03 (×2): qty 0.4

## 2022-08-03 MED ORDER — THIAMINE MONONITRATE 100 MG PO TABS: 100.0000 mg | ORAL_TABLET | Freq: Every day | ORAL | Status: DC

## 2022-08-03 MED ORDER — SODIUM CHLORIDE 0.9 % IV SOLN: INTRAVENOUS | Status: AC

## 2022-08-03 MED ORDER — DULOXETINE HCL 60 MG PO CPEP
60.0000 mg | ORAL_CAPSULE | Freq: Every day | ORAL | Status: DC
  Administered 2022-08-04 – 2022-08-05 (×2): 60 mg via ORAL
  Filled 2022-08-03 (×2): qty 1

## 2022-08-03 MED ORDER — LORAZEPAM 2 MG/ML IJ SOLN
1.0000 mg | Freq: Once | INTRAMUSCULAR | Status: AC
  Administered 2022-08-03: 1 mg via INTRAVENOUS
  Filled 2022-08-03: qty 1

## 2022-08-03 MED ORDER — TRAZODONE HCL 50 MG PO TABS: 50.0000 mg | ORAL_TABLET | Freq: Every evening | ORAL | Status: DC | PRN

## 2022-08-03 MED ORDER — PANTOPRAZOLE SODIUM 20 MG PO TBEC: 20.0000 mg | DELAYED_RELEASE_TABLET | Freq: Every day | ORAL | Status: DC

## 2022-08-03 MED ORDER — HALOPERIDOL LACTATE 5 MG/ML IJ SOLN
2.0000 mg | Freq: Once | INTRAMUSCULAR | Status: AC
  Administered 2022-08-03: 2 mg via INTRAVENOUS
  Filled 2022-08-03: qty 1

## 2022-08-03 MED ORDER — ASPIRIN 81 MG PO TBEC: 81.0000 mg | DELAYED_RELEASE_TABLET | Freq: Every day | ORAL | Status: DC

## 2022-08-03 MED ORDER — LORAZEPAM 1 MG PO TABS
1.0000 mg | ORAL_TABLET | ORAL | Status: DC | PRN
  Filled 2022-08-03: qty 1

## 2022-08-03 MED ORDER — NALOXONE HCL 0.4 MG/ML IJ SOLN
INTRAMUSCULAR | Status: AC
  Administered 2022-08-03: 0.4 mg via INTRAVENOUS
  Filled 2022-08-03: qty 1

## 2022-08-03 MED ORDER — HALOPERIDOL LACTATE 5 MG/ML IJ SOLN
5.0000 mg | Freq: Four times a day (QID) | INTRAMUSCULAR | Status: DC | PRN
  Administered 2022-08-04: 5 mg via INTRAVENOUS
  Filled 2022-08-03: qty 1

## 2022-08-03 MED ORDER — ASPIRIN 81 MG PO TBEC
81.0000 mg | DELAYED_RELEASE_TABLET | Freq: Every day | ORAL | Status: DC
  Administered 2022-08-04 – 2022-08-05 (×2): 81 mg via ORAL
  Filled 2022-08-03 (×2): qty 1

## 2022-08-03 MED ORDER — TAMSULOSIN HCL 0.4 MG PO CAPS
0.4000 mg | ORAL_CAPSULE | Freq: Every day | ORAL | Status: DC
  Administered 2022-08-04 – 2022-08-05 (×2): 0.4 mg via ORAL
  Filled 2022-08-03 (×2): qty 1

## 2022-08-03 MED ORDER — CYCLOBENZAPRINE HCL 10 MG PO TABS
10.0000 mg | ORAL_TABLET | Freq: Two times a day (BID) | ORAL | Status: DC | PRN
  Administered 2022-08-04: 10 mg via ORAL
  Filled 2022-08-03: qty 1

## 2022-08-03 MED ORDER — DIVALPROEX SODIUM 250 MG PO DR TAB: 500.0000 mg | DELAYED_RELEASE_TABLET | Freq: Two times a day (BID) | ORAL | Status: DC

## 2022-08-03 MED ORDER — NALOXONE HCL 2 MG/2ML IJ SOSY
PREFILLED_SYRINGE | INTRAMUSCULAR | Status: AC
  Administered 2022-08-03: 1 mg
  Filled 2022-08-03: qty 2

## 2022-08-03 MED ORDER — PANTOPRAZOLE SODIUM 20 MG PO TBEC
20.0000 mg | DELAYED_RELEASE_TABLET | Freq: Every day | ORAL | Status: DC
  Administered 2022-08-04 – 2022-08-05 (×2): 20 mg via ORAL
  Filled 2022-08-03 (×2): qty 1

## 2022-08-03 MED ORDER — MELOXICAM 7.5 MG PO TABS: 7.5000 mg | ORAL_TABLET | Freq: Every day | ORAL | Status: DC

## 2022-08-03 MED ORDER — THIAMINE MONONITRATE 100 MG PO TABS
100.0000 mg | ORAL_TABLET | Freq: Every day | ORAL | Status: DC
  Administered 2022-08-04 – 2022-08-05 (×2): 100 mg via ORAL
  Filled 2022-08-03 (×2): qty 1

## 2022-08-03 MED ORDER — IBUPROFEN 200 MG PO TABS
600.0000 mg | ORAL_TABLET | Freq: Four times a day (QID) | ORAL | Status: DC | PRN
  Administered 2022-08-04 – 2022-08-05 (×4): 600 mg via ORAL
  Filled 2022-08-03 (×4): qty 3

## 2022-08-03 MED ORDER — DOCUSATE SODIUM 100 MG PO CAPS: 100.0000 mg | ORAL_CAPSULE | Freq: Every day | ORAL | Status: DC | PRN

## 2022-08-03 MED ORDER — ADULT MULTIVITAMIN W/MINERALS CH: 1.0000 | ORAL_TABLET | Freq: Every day | ORAL | Status: DC

## 2022-08-03 MED ORDER — NALOXONE HCL 0.4 MG/ML IJ SOLN
0.4000 mg | Freq: Once | INTRAMUSCULAR | Status: AC
  Administered 2022-08-03: 0.4 mg via INTRAVENOUS

## 2022-08-03 MED ORDER — LORAZEPAM 2 MG/ML IJ SOLN: 1.0000 mg | INTRAMUSCULAR | Status: DC | PRN

## 2022-08-03 MED ORDER — MIDAZOLAM HCL 2 MG/2ML IJ SOLN
2.0000 mg | Freq: Once | INTRAMUSCULAR | Status: AC
  Administered 2022-08-03: 2 mg via INTRAVENOUS
  Filled 2022-08-03: qty 2

## 2022-08-03 MED ORDER — ENALAPRIL MALEATE 2.5 MG PO TABS
2.5000 mg | ORAL_TABLET | Freq: Two times a day (BID) | ORAL | Status: DC
  Administered 2022-08-04 – 2022-08-05 (×3): 2.5 mg via ORAL
  Filled 2022-08-03 (×5): qty 1

## 2022-08-03 MED ORDER — FOLIC ACID 1 MG PO TABS
1.0000 mg | ORAL_TABLET | Freq: Every day | ORAL | Status: DC
  Administered 2022-08-04 – 2022-08-05 (×2): 1 mg via ORAL
  Filled 2022-08-03 (×2): qty 1

## 2022-08-03 MED ORDER — THIAMINE MONONITRATE 100 MG PO TABS
100.0000 mg | ORAL_TABLET | Freq: Every day | ORAL | Status: DC
  Administered 2022-08-05: 100 mg via ORAL
  Filled 2022-08-03: qty 1

## 2022-08-03 MED ORDER — ORAL CARE MOUTH RINSE: 15.0000 mL | OROMUCOSAL | Status: DC | PRN

## 2022-08-03 MED ORDER — ADULT MULTIVITAMIN W/MINERALS CH
1.0000 | ORAL_TABLET | Freq: Every day | ORAL | Status: DC
  Filled 2022-08-03 (×2): qty 1

## 2022-08-03 MED ORDER — LORATADINE 10 MG PO TABS: 10.0000 mg | ORAL_TABLET | Freq: Every day | ORAL | Status: DC

## 2022-08-03 MED ORDER — TAMSULOSIN HCL 0.4 MG PO CAPS: 0.4000 mg | ORAL_CAPSULE | Freq: Every day | ORAL | Status: DC

## 2022-08-03 MED ORDER — ATORVASTATIN CALCIUM 80 MG PO TABS
80.0000 mg | ORAL_TABLET | Freq: Every day | ORAL | Status: DC
  Administered 2022-08-04: 80 mg via ORAL
  Filled 2022-08-03: qty 1

## 2022-08-03 MED ORDER — ROPINIROLE HCL 1 MG PO TABS
2.0000 mg | ORAL_TABLET | Freq: Every day | ORAL | Status: DC
  Administered 2022-08-04: 2 mg via ORAL
  Filled 2022-08-03: qty 2

## 2022-08-03 MED ORDER — DULOXETINE HCL 30 MG PO CPEP: 60.0000 mg | ORAL_CAPSULE | Freq: Every day | ORAL | Status: DC

## 2022-08-03 MED ORDER — ADULT MULTIVITAMIN W/MINERALS CH
1.0000 | ORAL_TABLET | Freq: Every day | ORAL | Status: DC
  Administered 2022-08-04 – 2022-08-05 (×2): 1 via ORAL
  Filled 2022-08-03: qty 1

## 2022-08-03 MED ORDER — THIAMINE HCL 100 MG/ML IJ SOLN
100.0000 mg | Freq: Every day | INTRAMUSCULAR | Status: DC
  Administered 2022-08-03: 100 mg via INTRAVENOUS
  Filled 2022-08-03 (×2): qty 2

## 2022-08-03 MED ORDER — ALBUTEROL SULFATE (2.5 MG/3ML) 0.083% IN NEBU: 3.0000 mL | INHALATION_SOLUTION | Freq: Four times a day (QID) | RESPIRATORY_TRACT | Status: DC | PRN

## 2022-08-03 MED ORDER — DIVALPROEX SODIUM 250 MG PO DR TAB
500.0000 mg | DELAYED_RELEASE_TABLET | Freq: Two times a day (BID) | ORAL | Status: DC
  Administered 2022-08-04 – 2022-08-05 (×3): 500 mg via ORAL
  Filled 2022-08-03 (×3): qty 2

## 2022-08-03 MED ORDER — BUSPIRONE HCL 10 MG PO TABS: 15.0000 mg | ORAL_TABLET | Freq: Every day | ORAL | Status: DC

## 2022-08-03 MED ORDER — FLUTICASONE PROPIONATE 50 MCG/ACT NA SUSP
1.0000 | Freq: Every day | NASAL | Status: DC
  Administered 2022-08-04 – 2022-08-05 (×2): 1 via NASAL
  Filled 2022-08-03: qty 16

## 2022-08-03 NOTE — ED Notes (Signed)
Patient continues to try to climb out of bed. Pt not oriented, verbal attempts to reorient patient unsuccessful.

## 2022-08-03 NOTE — ED Notes (Signed)
ED TO INPATIENT HANDOFF REPORT  ED Nurse Name and Phone #: Andee Poles [phone]   S Name/Age/Gender Keith Gibbs 62 y.o. male Room/Bed: 030C/030C  Code Status   Code Status: Full Code  Home/SNF/Other Home Patient oriented to: self Is this baseline? No   Triage Complete: Triage complete  Chief Complaint Encephalopathy [G93.40]  Triage Note Pt BIB EMS from home. Pt's mother reported that he was having visual hallucinations due to illicit drug use. Patient was not able to have purposeful conversation with EMS. Patient has an extensive psych history and drug use, per mother. Patient had '5mg'$  of IM versed with EMS. Patient is continuing to have agitated movement.   EMS Vitals 178/90 HR 84 NSR CBG 136  18# L Hand   Allergies Allergies  Allergen Reactions   Latuda [Lurasidone Hcl] Other (See Comments)    Tremors     Saphris [Asenapine] Other (See Comments)    Increased tremors     Level of Care/Admitting Diagnosis ED Disposition     ED Disposition  Admit   Condition  --   Comment  Hospital Area: [facility] [[id number]]  Level of Care: Progressive [102]  Admit to Progressive based on following criteria: ACUTE MENTAL DISORDER-RELATED Drug/Alcohol Ingestion/Overdose/Withdrawal, Suicidal Ideation/attempt requiring safety sitter and < Q2h monitoring/assessments, moderate to severe agitation that is managed with medication/sitter, CIWA-Ar score < 20.  May admit patient to [facility] or [facility] if equivalent level of care is available:: No  Covid Evaluation: Asymptomatic - no recent exposure (last 10 days) testing not required  Diagnosis: Encephalopathy [291958]  Admitting Physician: Lequita Halt [[id number]]  Attending Physician: Lequita Halt [[id number]]  Certification:: I certify this patient will need inpatient services for at least 2 midnights  Estimated Length of Stay: 3          B Medical/Surgery History Past Medical History:  Diagnosis  Date   Anginal pain ([place])    Anxiety    Arthritis    Bipolar 1 disorder ([place])    Bursitis    CAD (coronary artery disease)    Chronic pain    COPD (chronic obstructive pulmonary disease) ([place])    Current use of long term anticoagulation    DAPT (ASA + clopidogrel)   Depression    Diverticulitis    Dyspnea    GERD (gastroesophageal reflux disease)    Grade I diastolic dysfunction    Hepatitis C 2012   No longer has Hep C   HLD (hyperlipidemia)    Hypertension    MI (myocardial infarction) ([place])    Polysubstance abuse ([place])    cocaine, marijuana, ETOH   PUD (peptic ulcer disease)    S/P angioplasty with stent 06/10/2016   a.) 90% stenosis of pLAD to mLAD - 2.5 x 18 mm Xience Alpine (DES x 1) placed to pLAD   S/P PTCA (percutaneous transluminal coronary angioplasty) 12/04/2019   a.) 60% in stent restenosis of DES to pLAD; LVEF 65%.   Schizophrenia ([place])    Stroke [facility])    Valvular insufficiency    a.) Mild MR, TR, PR; mild to moderate AR on 03/05/2018 TTE   Past Surgical History:  Procedure Laterality Date   ABDOMINAL SURGERY     removed small piece of intestines due to [facility] Diverticulosis   APPENDECTOMY     BACK SURGERY     CARDIAC CATHETERIZATION Left 06/10/2016   Procedure: Left Heart Cath and Coronary Angiography;  Surgeon: Dionisio David, MD;  Location:  [place] [facility];  Service: Cardiovascular;  Laterality: Left;   CARDIAC CATHETERIZATION N/A 06/10/2016   Procedure: Coronary Stent Intervention;  Surgeon: Yolonda Kida, MD;  Location: [place] [facility];  Service: Cardiovascular;  Laterality: N/A;   CHOLECYSTECTOMY N/A 02/17/2022   Procedure: LAPAROSCOPIC CHOLECYSTECTOMY;  Surgeon: Kieth Brightly Arta Bruce, MD;  Location: [place];  Service: General;  Laterality: N/A;   COLON SURGERY     COLONOSCOPY     COLONOSCOPY WITH PROPOFOL N/A 01/05/2017   Procedure: COLONOSCOPY WITH PROPOFOL;  Surgeon: Jonathon Bellows, MD;  Location: [facility] ENDOSCOPY;  Service: Endoscopy;   Laterality: N/A;   COLONOSCOPY WITH PROPOFOL N/A 02/13/2020   Procedure: COLONOSCOPY WITH PROPOFOL;  Surgeon: Virgel Manifold, MD;  Location: [facility] ENDOSCOPY;  Service: Endoscopy;  Laterality: N/A;   CORONARY ANGIOPLASTY WITH STENT PLACEMENT     ESOPHAGOGASTRODUODENOSCOPY (EGD) WITH PROPOFOL N/A 01/05/2017   Procedure: ESOPHAGOGASTRODUODENOSCOPY (EGD) WITH PROPOFOL;  Surgeon: Jonathon Bellows, MD;  Location: [facility] ENDOSCOPY;  Service: Endoscopy;  Laterality: N/A;   ESOPHAGOGASTRODUODENOSCOPY (EGD) WITH PROPOFOL N/A 02/13/2020   Procedure: ESOPHAGOGASTRODUODENOSCOPY (EGD) WITH PROPOFOL;  Surgeon: Virgel Manifold, MD;  Location: [facility] ENDOSCOPY;  Service: Endoscopy;  Laterality: N/A;   INTRAVASCULAR PRESSURE WIRE/FFR STUDY N/A 12/04/2019   Procedure: INTRAVASCULAR PRESSURE WIRE/FFR STUDY;  Surgeon: Sherren Mocha, MD;  Location: [place] CV [facility];  Service: Cardiovascular;  Laterality: N/A;   KNEE ARTHROSCOPY WITH MEDIAL MENISECTOMY Right 09/05/2017   Procedure: KNEE ARTHROSCOPY WITH MEDIAL AND LATERAL  MENISECTOMY PARTIAL SYNOVECTOMY;  Surgeon: Hessie Knows, MD;  Location: [facility] ORS;  Service: Orthopedics;  Laterality: Right;   LEFT HEART CATH AND CORONARY ANGIOGRAPHY N/A 12/04/2019   Procedure: LEFT HEART CATH AND CORONARY ANGIOGRAPHY;  Surgeon: Sherren Mocha, MD;  Location: [place] [facility];  Service: Cardiovascular;  Laterality: N/A;   SHOULDER SURGERY Right 04/09/2012   SPINE SURGERY     TOTAL KNEE ARTHROPLASTY Right 01/19/2021   Procedure: TOTAL KNEE ARTHROPLASTY - Rachelle Hora to Assist;  Surgeon: Hessie Knows, MD;  Location: [facility] ORS;  Service: Orthopedics;  Laterality: Right;     A IV Location/Drains/Wounds Patient Lines/Drains/Airways Status     Active Line/Drains/Airways     Name Placement date Placement time Site Days   Peripheral IV 08/03/22 18 G Left;Posterior Hand 08/03/22  0925  Hand  less than 1   Peripheral IV 08/03/22 18 G Anterior;Right Forearm 08/03/22  0955   Forearm  less than 1   Negative Pressure Wound Therapy Knee Anterior;Right 01/19/21  --  --  561   Incision (Closed) 01/19/21 Knee Right 01/19/21  1057  -- 561   Incision (Closed) 02/17/22 Abdomen Other (Comment) 02/17/22  1056  -- 167   Incision - 4 Ports Abdomen 1: Umbilicus 2: Right;Upper 3: Right;Mid 4: Right;Lower 02/17/22  1136  -- 167            Intake/Output Last 24 hours No intake or output data in the 24 hours ending 08/03/22 1549  Labs/Imaging Results for orders placed or performed during the hospital encounter of 08/03/22 (from the past 48 hour(s))  POC CBG, ED     Status: Abnormal   Collection Time: 08/03/22  9:38 AM  Result Value Ref Range   Glucose-Capillary 102 (H) 70 - 99 mg/dL    Comment: Glucose reference range applies only to samples taken after fasting for at least 8 hours.  CBC with Differential     Status: Abnormal   Collection Time: 08/03/22  9:50 AM  Result Value  Ref Range   WBC 11.5 (H) 4.0 - 10.5 K/uL   RBC 4.30 4.22 - 5.81 MIL/uL   Hemoglobin 14.3 13.0 - 17.0 g/dL   HCT 41.7 39.0 - 52.0 %   MCV 97.0 80.0 - 100.0 fL   MCH 33.3 26.0 - 34.0 pg   MCHC 34.3 30.0 - 36.0 g/dL   RDW 14.3 11.5 - 15.5 %   Platelets 206 150 - 400 K/uL   nRBC 0.0 0.0 - 0.2 %   Neutrophils Relative % 81 %   Neutro Abs 9.3 (H) 1.7 - 7.7 K/uL   Lymphocytes Relative 14 %   Lymphs Abs 1.6 0.7 - 4.0 K/uL   Monocytes Relative 4 %   Monocytes Absolute 0.5 0.1 - 1.0 K/uL   Eosinophils Relative 1 %   Eosinophils Absolute 0.1 0.0 - 0.5 K/uL   Basophils Relative 0 %   Basophils Absolute 0.0 0.0 - 0.1 K/uL   Immature Granulocytes 0 %   Abs Immature Granulocytes 0.03 0.00 - 0.07 K/uL    Comment: Performed at [facility] [address] [place], [place] [address]  Valproic acid level     Status: Abnormal   Collection Time: 08/03/22  9:50 AM  Result Value Ref Range   Valproic Acid Lvl 31 (L) 50.0 - 100.0 ug/mL    Comment: Performed at [facility] [address], [place], [place] [address]  CK     Status: None   Collection Time: 08/03/22  9:50 AM  Result Value Ref Range   Total CK 384 49 - 397 U/L    Comment: Performed at [facility], [place] [address], [place], [place] [address]  Ethanol     Status: None   Collection Time: 08/03/22  9:52 AM  Result Value Ref Range   Alcohol, Ethyl (B) <10 <10 mg/dL    Comment: (NOTE) Lowest detectable limit for serum alcohol is 10 mg/dL.  For medical purposes only. Performed at [facility], [place] [address], [place], [place] [address]   Rapid urine drug screen (hospital performed)     Status: Abnormal   Collection Time: 08/03/22 10:03 AM  Result Value Ref Range   Opiates NONE DETECTED NONE DETECTED   Cocaine POSITIVE (A) NONE DETECTED   Benzodiazepines POSITIVE (A) NONE DETECTED   Amphetamines NONE DETECTED NONE DETECTED   Tetrahydrocannabinol NONE DETECTED NONE DETECTED   Barbiturates NONE DETECTED NONE DETECTED    Comment: (NOTE) DRUG SCREEN FOR MEDICAL PURPOSES ONLY.  IF CONFIRMATION IS NEEDED FOR ANY PURPOSE, NOTIFY LAB WITHIN 5 DAYS.  LOWEST DETECTABLE LIMITS FOR URINE DRUG SCREEN Drug Class                     Cutoff (ng/mL) Amphetamine and metabolites    1000 Barbiturate and metabolites    200 Benzodiazepine                 200 Opiates and metabolites        300 Cocaine and metabolites        300 THC                            50 Performed at [facility], [place] [address], [place], [place] [address]   Urinalysis, Routine w reflex microscopic Urine, Catheterized     Status: Abnormal   Collection Time: 08/03/22 10:03 AM  Result Value Ref Range  Color, Urine YELLOW YELLOW   APPearance HAZY (A) CLEAR   Specific Gravity, Urine 1.025 1.005 - 1.030   pH 5.0 5.0 - 8.0   Glucose, UA NEGATIVE NEGATIVE mg/dL   Hgb urine dipstick SMALL (A) NEGATIVE   Bilirubin Urine NEGATIVE NEGATIVE   Ketones, ur 5 (A) NEGATIVE mg/dL   Protein, ur NEGATIVE NEGATIVE mg/dL   Nitrite NEGATIVE  NEGATIVE   Leukocytes,Ua NEGATIVE NEGATIVE   RBC / HPF 11-20 0 - 5 RBC/hpf   WBC, UA 0-5 0 - 5 WBC/hpf   Bacteria, UA RARE (A) NONE SEEN   Squamous Epithelial / HPF 0-5 0 - 5 /HPF   Mucus PRESENT    Sperm, UA PRESENT     Comment: Performed at [facility] [address], [place], [place] [address]  Comprehensive metabolic panel     Status: Abnormal   Collection Time: 08/03/22 11:51 AM  Result Value Ref Range   Sodium 138 135 - 145 mmol/L   Potassium 3.6 3.5 - 5.1 mmol/L   Chloride 104 98 - 111 mmol/L   CO2 23 22 - 32 mmol/L   Glucose, Bld 102 (H) 70 - 99 mg/dL    Comment: Glucose reference range applies only to samples taken after fasting for at least 8 hours.   BUN 18 8 - 23 mg/dL   Creatinine, Ser 0.84 0.61 - 1.24 mg/dL   Calcium 9.1 8.9 - 10.3 mg/dL   Total Protein 6.9 6.5 - 8.1 g/dL   Albumin 4.1 3.5 - 5.0 g/dL   AST 27 15 - 41 U/L   ALT 18 0 - 44 U/L   Alkaline Phosphatase 55 38 - 126 U/L   Total Bilirubin 1.0 0.3 - 1.2 mg/dL   GFR, Estimated >60 >60 mL/min    Comment: (NOTE) Calculated using the CKD-EPI Creatinine Equation (2021)    Anion gap 11 5 - 15    Comment: Performed at [facility] [address] [place], [place] [address]   CT Head Wo Contrast  Result Date: 08/03/2022 CLINICAL DATA:  Altered mental status. EXAM: CT HEAD WITHOUT CONTRAST TECHNIQUE: Contiguous axial images were obtained from the base of the skull through the vertex without intravenous contrast. RADIATION DOSE REDUCTION: This exam was performed according to the departmental dose-optimization program which includes automated exposure control, adjustment of the mA and/or kV according to patient size and/or use of iterative reconstruction technique. COMPARISON:  Head CT September 14, 2021. FINDINGS: Brain: No evidence of acute infarction, hemorrhage, hydrocephalus, extra-axial collection or mass lesion/mass effect. Vascular: No hyperdense vessel or unexpected calcification. Skull: Normal.  Negative for fracture or focal lesion. Sinuses/Orbits: Mild mucosal thickening of the ethmoid cells. The orbits are maintained. Other: None. IMPRESSION: No acute intracranial abnormality. Electronically Signed   By: Pedro Earls M.D.   On: 08/03/2022 12:47    Pending Labs Unresulted Labs (From admission, onward)     Start     Ordered   08/04/22 0109  Basic metabolic panel  Tomorrow morning,   R        08/03/22 1459   08/03/22 1459  TSH  Add-on,   AD        08/03/22 1458   08/03/22 1459  T4, free  Add-on,   AD        08/03/22 1458            Vitals/Pain Today's Vitals   08/03/22 1241 08/03/22 1245 08/03/22 1330 08/03/22 1415  BP: 108/68  105/79 133/68 123/69  Pulse: 74 73 65 64  Resp: 20 (!) '22 16 19  '$ Temp: 97.8 F (36.6 C)     TempSrc: Axillary     SpO2: 96% 94% 94% 93%  Weight:      Height:        Isolation Precautions No active isolations  Medications Medications  aspirin EC tablet 81 mg (81 mg Oral Not Given 08/03/22 1522)  ibuprofen (ADVIL) tablet 600 mg (has no administration in time range)  meloxicam (MOBIC) tablet 7.5 mg (7.5 mg Oral Not Given 08/03/22 1523)  atorvastatin (LIPITOR) tablet 80 mg (has no administration in time range)  enalapril (VASOTEC) tablet 2.5 mg (2.5 mg Oral Not Given 08/03/22 1523)  busPIRone (BUSPAR) tablet 15 mg (15 mg Oral Not Given 08/03/22 1523)  DULoxetine (CYMBALTA) DR capsule 60 mg (60 mg Oral Not Given 08/03/22 1523)  traZODone (DESYREL) tablet 50 mg (has no administration in time range)  docusate sodium (COLACE) capsule 100 mg (has no administration in time range)  pantoprazole (PROTONIX) EC tablet 20 mg (20 mg Oral Not Given 08/03/22 1523)  tamsulosin (FLOMAX) capsule 0.4 mg (0.4 mg Oral Not Given 08/03/22 1524)  cyclobenzaprine (FLEXERIL) tablet 10 mg (has no administration in time range)  divalproex (DEPAKOTE) DR tablet 500 mg (500 mg Oral Not Given 08/03/22 1524)  rOPINIRole (REQUIP) tablet 2 mg (has no  administration in time range)  multivitamin with minerals tablet 1 tablet (1 tablet Oral Not Given 08/03/22 1524)  thiamine (VITAMIN B1) tablet 100 mg (100 mg Oral Not Given 08/03/22 1524)  albuterol (VENTOLIN HFA) 108 (90 Base) MCG/ACT inhaler 2 puff (has no administration in time range)  fluticasone (FLONASE) 50 MCG/ACT nasal spray 1 spray (has no administration in time range)  loratadine (CLARITIN) tablet 10 mg (10 mg Oral Not Given 08/03/22 1525)  LORazepam (ATIVAN) tablet 1-4 mg (has no administration in time range)    Or  LORazepam (ATIVAN) injection 1-4 mg (has no administration in time range)  thiamine (VITAMIN B1) tablet 100 mg ( Oral See Alternative 08/03/22 1509)    Or  thiamine (VITAMIN B1) injection 100 mg (100 mg Intravenous Given 2/83/15 1761)  folic acid (FOLVITE) tablet 1 mg (1 mg Oral Not Given 08/03/22 1510)  multivitamin with minerals tablet 1 tablet (1 tablet Oral Not Given 08/03/22 1510)  enoxaparin (LOVENOX) injection 40 mg (has no administration in time range)  0.9 %  sodium chloride infusion ( Intravenous New Bag/Given 08/03/22 1515)  haloperidol lactate (HALDOL) injection 5 mg (has no administration in time range)  naloxone [facility]) injection 0.4 mg (0.4 mg Intravenous Given 08/03/22 0939)  haloperidol lactate (HALDOL) injection 2 mg (2 mg Intravenous Given 08/03/22 0947)  naloxone (NARCAN) 2 MG/2ML injection (1 mg  Given 08/03/22 0947)  haloperidol lactate (HALDOL) injection 2 mg (2 mg Intravenous Given 08/03/22 1013)  midazolam (VERSED) injection 2 mg (2 mg Intravenous Given 08/03/22 1107)  LORazepam (ATIVAN) injection 1 mg (1 mg Intravenous Given 08/03/22 1154)    Mobility walks     Focused Assessments    R Recommendations: See Admitting Provider Note  Report given to:   Additional Notes:   Safety restraints, combative, disoriented to all,

## 2022-08-03 NOTE — ED Triage Notes (Signed)
Pt BIB EMS from home. Pt's mother reported that he was having visual hallucinations due to illicit drug use. Patient was not able to have purposeful conversation with EMS. Patient has an extensive psych history and drug use, per mother. Patient had '5mg'$  of IM versed with EMS. Patient is continuing to have agitated movement.   EMS Vitals 178/90 HR 84 NSR CBG 136  18# L Hand

## 2022-08-03 NOTE — ED Notes (Signed)
Patient not responding to reorientation at this time. Patient continues to try to get out of bed is combative and refusing to remain still for CT scan.

## 2022-08-03 NOTE — Consult Note (Signed)
Brief Psychiatry Consult Note  Consult came in afternoon. Unable to see pt today. Put in 1:1 d/t report of suicide attempt.   - psych team to see tomorrow.   Dyshawn Cangelosi A Da Michelle

## 2022-08-03 NOTE — ED Notes (Signed)
Patient continues to try to climb out of the bed despite efforts to redirect and calm patient. MD notified

## 2022-08-03 NOTE — H&P (Signed)
History and Physical    Keith Gibbs BZJ:[id number] DOB: 05-25-1961 DOA: 08/03/2022  PCP: Gildardo Pounds, NP (Confirm with patient/family/NH records and if not entered, this has to be entered at [facility] point of entry) Patient coming from: Home  I have personally briefly reviewed patient's old medical records in [facility]  Chief Complaint: Patient is sedated now  HPI: Keith Gibbs is a 62 y.o. male with medical history significant of polysubstance abuse, bipolar disorder, schizoaffective disorder, BPH, COPD, CAD status post LAD stenting, sent for by family member for altered mentations.  Currently, patient is sedated unable to provide any history, or history provided by patient's mother over the phone.  At baseline, the patient lives with his mother.  Mother reported patient has had episode of worsening of his baseline psychiatry problems with frequent visual hallucinations, and suicidal ideas in last few weeks for which he was brought into psychiatry clinic 2 days ago.  When his psychiatry medication were adjusted, patient was started on Zyprexa 5 mg at bedtime as well as Depakote 500 mg twice daily.  Mother concerned about the patient restarted multidrug abuse behavior as patient left home yesterday without notifying her.  She also did a pill count while patient is absent at home and found patient probably took 28 of family grams of Zyprexa as only 2 tablets of the present left in the 30-day supply, with whole box intact Depakote at same time. This morning, patient came back with bizarre behavior, when he was in on the floor mumbling and not responding.  When mother called EMS.  EMS arrived and patient became agitated, and patient was given Versed 5 mg IM x 1 on scene.  ED Course: Patient continued to be very combative in the ED, CT head was unremarkable, pupils pinpointed and patient was given multiple psych medication including Ativan, Zyprexa and Haldol.  UDS showed cocaine and  benzos.  Review of Systems: Unable to perform, patient is sedated.  Past Medical History:  Diagnosis Date   Anginal pain ([place])    Anxiety    Arthritis    Bipolar 1 disorder (HCC)    Bursitis    CAD (coronary artery disease)    Chronic pain    COPD (chronic obstructive pulmonary disease) (HCC)    Current use of long term anticoagulation    DAPT (ASA + clopidogrel)   Depression    Diverticulitis    Dyspnea    GERD (gastroesophageal reflux disease)    Grade I diastolic dysfunction    Hepatitis C 2012   No longer has Hep C   HLD (hyperlipidemia)    Hypertension    MI (myocardial infarction) ([place])    Polysubstance abuse (HCC)    cocaine, marijuana, ETOH   PUD (peptic ulcer disease)    S/P angioplasty with stent 06/10/2016   a.) 90% stenosis of pLAD to mLAD - 2.5 x 18 mm Xience Alpine (DES x 1) placed to pLAD   S/P PTCA (percutaneous transluminal coronary angioplasty) 12/04/2019   a.) 60% in stent restenosis of DES to pLAD; LVEF 65%.   Schizophrenia ([place])    Stroke [facility])    Valvular insufficiency    a.) Mild MR, TR, PR; mild to moderate AR on 03/05/2018 TTE    Past Surgical History:  Procedure Laterality Date   ABDOMINAL SURGERY     removed small piece of intestines due to [place]  CATHETERIZATION Left 06/10/2016   Procedure: Left Heart Cath and Coronary Angiography;  Surgeon: Dionisio David, MD;  Location: [place] [facility];  Service: Cardiovascular;  Laterality: Left;   CARDIAC CATHETERIZATION N/A 06/10/2016   Procedure: Coronary Stent Intervention;  Surgeon: Yolonda Kida, MD;  Location: [place] [facility];  Service: Cardiovascular;  Laterality: N/A;   CHOLECYSTECTOMY N/A 02/17/2022   Procedure: LAPAROSCOPIC CHOLECYSTECTOMY;  Surgeon: Kieth Brightly Arta Bruce, MD;  Location: [place];  Service: General;  Laterality: N/A;   COLON SURGERY     COLONOSCOPY     COLONOSCOPY WITH PROPOFOL N/A 01/05/2017    Procedure: COLONOSCOPY WITH PROPOFOL;  Surgeon: Jonathon Bellows, MD;  Location: [facility] ENDOSCOPY;  Service: Endoscopy;  Laterality: N/A;   COLONOSCOPY WITH PROPOFOL N/A 02/13/2020   Procedure: COLONOSCOPY WITH PROPOFOL;  Surgeon: Virgel Manifold, MD;  Location: [facility] ENDOSCOPY;  Service: Endoscopy;  Laterality: N/A;   CORONARY ANGIOPLASTY WITH STENT PLACEMENT     ESOPHAGOGASTRODUODENOSCOPY (EGD) WITH PROPOFOL N/A 01/05/2017   Procedure: ESOPHAGOGASTRODUODENOSCOPY (EGD) WITH PROPOFOL;  Surgeon: Jonathon Bellows, MD;  Location: [facility] ENDOSCOPY;  Service: Endoscopy;  Laterality: N/A;   ESOPHAGOGASTRODUODENOSCOPY (EGD) WITH PROPOFOL N/A 02/13/2020   Procedure: ESOPHAGOGASTRODUODENOSCOPY (EGD) WITH PROPOFOL;  Surgeon: Virgel Manifold, MD;  Location: [facility] ENDOSCOPY;  Service: Endoscopy;  Laterality: N/A;   INTRAVASCULAR PRESSURE WIRE/FFR STUDY N/A 12/04/2019   Procedure: INTRAVASCULAR PRESSURE WIRE/FFR STUDY;  Surgeon: Sherren Mocha, MD;  Location: [place] [facility];  Service: Cardiovascular;  Laterality: N/A;   KNEE ARTHROSCOPY WITH MEDIAL MENISECTOMY Right 09/05/2017   Procedure: KNEE ARTHROSCOPY WITH MEDIAL AND LATERAL  MENISECTOMY PARTIAL SYNOVECTOMY;  Surgeon: Hessie Knows, MD;  Location: [facility] ORS;  Service: Orthopedics;  Laterality: Right;   LEFT HEART CATH AND CORONARY ANGIOGRAPHY N/A 12/04/2019   Procedure: LEFT HEART CATH AND CORONARY ANGIOGRAPHY;  Surgeon: Sherren Mocha, MD;  Location: [place] [facility];  Service: Cardiovascular;  Laterality: N/A;   SHOULDER SURGERY Right 04/09/2012   SPINE SURGERY     TOTAL KNEE ARTHROPLASTY Right 01/19/2021   Procedure: TOTAL KNEE ARTHROPLASTY - Rachelle Hora to Assist;  Surgeon: Hessie Knows, MD;  Location: [facility] ORS;  Service: Orthopedics;  Laterality: Right;     reports that he has been smoking cigarettes. He has a 16.00 pack-year smoking history. He has never used smokeless tobacco. He reports current alcohol use of about 6.0 standard drinks of  alcohol per week. He reports that he does not currently use drugs after having used the following drugs: Cocaine and Marijuana.  Allergies  Allergen Reactions   Latuda [Lurasidone Hcl] Other (See Comments)    Tremors     Saphris [Asenapine] Other (See Comments)    Increased tremors     Family History  Problem Relation Age of Onset   Osteoarthritis Mother    Heart disease Mother    Hypertension Mother    Depression Mother    Heart disease Father    Early death Father    Hypertension Father    Heart attack Father    Hypertension Sister    Prostate cancer Neg Hx    Bladder Cancer Neg Hx    Kidney cancer Neg Hx      Prior to Admission medications   Medication Sig Start Date End Date Taking? Authorizing Provider  albuterol (VENTOLIN HFA) 108 (90 Base) MCG/ACT inhaler Inhale 2 puffs into the lungs every 6 (six) hours as needed for wheezing or shortness of breath. 05/28/19   Charlott Rakes, MD  aspirin EC  81 MG tablet Take 1 tablet (81 mg total) by mouth daily. Swallow whole. 01/03/22   Gildardo Pounds, NP  atorvastatin (LIPITOR) 80 MG tablet Take 1 tablet (80 mg total) by mouth at bedtime. 05/01/22   Armando Reichert, MD  busPIRone (BUSPAR) 15 MG tablet Take 1 tablet (15 mg total) by mouth daily. 08/01/22   Salley Slaughter, NP  cyclobenzaprine (FLEXERIL) 10 MG tablet Take 1 tablet (10 mg total) by mouth 2 (two) times daily as needed for muscle spasms. 05/29/22   Domenic Moras, PA-C  diclofenac Sodium (VOLTAREN) 1 % GEL Apply 2 g topically 4 (four) times daily. 05/01/22   Armando Reichert, MD  divalproex (DEPAKOTE) 500 MG DR tablet Take 1 tablet (500 mg total) by mouth 2 (two) times daily. 08/01/22   Salley Slaughter, NP  docusate sodium (COLACE) 100 MG capsule Take 1 capsule (100 mg total) by mouth daily as needed for mild constipation. 05/01/22   Armando Reichert, MD  DULoxetine (CYMBALTA) 60 MG capsule Take 1 capsule (60 mg total) by mouth daily. 05/01/22   Armando Reichert, MD  enalapril  (VASOTEC) 2.5 MG tablet Take 1 tablet (2.5 mg total) by mouth 2 (two) times daily. 05/01/22   Armando Reichert, MD  fluticasone (FLONASE) 50 MCG/ACT nasal spray Place 1 spray into both nostrils at bedtime. 05/01/22   Armando Reichert, MD  ibuprofen (ADVIL) 600 MG tablet Take 1 tablet (600 mg total) by mouth every 6 (six) hours as needed. 05/29/22   Domenic Moras, PA-C  loratadine (CLARITIN) 10 MG tablet Take 1 tablet (10 mg total) by mouth daily. 05/02/22   Armando Reichert, MD  meloxicam (MOBIC) 7.5 MG tablet Take 1 tablet (7.5 mg total) by mouth daily. 05/02/22   Armando Reichert, MD  Multiple Vitamin (MULTIVITAMIN WITH MINERALS) TABS tablet Take 1 tablet by mouth daily. 05/02/22   Armando Reichert, MD  nitroGLYCERIN (NITROSTAT) 0.3 MG SL tablet Place 1 tablet (0.3 mg total) under the tongue every 5 (five) minutes as needed for chest pain. Maximum of 3 doses daily. 05/01/22   Armando Reichert, MD  OLANZapine (ZYPREXA) 5 MG tablet Take 1 tablet (5 mg total) by mouth at bedtime. 08/01/22   Salley Slaughter, NP  omega-3 acid ethyl esters (LOVAZA) 1 g capsule Take 1 capsule (1 g total) by mouth daily. 05/02/22   Armando Reichert, MD  pantoprazole (PROTONIX) 20 MG tablet Take 1 tablet (20 mg total) by mouth daily for 15 days. 05/01/22 05/16/22  Armando Reichert, MD  predniSONE (DELTASONE) 20 MG tablet 3 tabs po day one, then 2 tabs daily x 4 days 05/29/22   Domenic Moras, PA-C  rOPINIRole (REQUIP) 2 MG tablet Take 1 tablet (2 mg total) by mouth at bedtime. TAKE 1 TABLET BY MOUTH EVERYDAY AT BEDTIME Strength: 2 mg 05/01/22   Armando Reichert, MD  tamsulosin (FLOMAX) 0.4 MG CAPS capsule Take 1 capsule (0.4 mg total) by mouth daily. 05/01/22   Armando Reichert, MD  thiamine (VITAMIN B-1) 100 MG tablet Take 1 tablet (100 mg total) by mouth daily. 05/02/22   Armando Reichert, MD  traZODone (DESYREL) 50 MG tablet Take 1 tablet (50 mg total) by mouth at bedtime as needed for sleep. 05/01/22   Armando Reichert, MD    Physical Exam: Vitals:   08/03/22 1241  08/03/22 1245 08/03/22 1330 08/03/22 1415  BP: 108/68 105/79 133/68 123/69  Pulse: 74 73 65 64  Resp: 20 (!) '22 16 19  '$ Temp: 97.8 F (36.6 C)  TempSrc: Axillary     SpO2: 96% 94% 94% 93%  Weight:      Height:        Constitutional: NAD, calm, comfortable Vitals:   08/03/22 1241 08/03/22 1245 08/03/22 1330 08/03/22 1415  BP: 108/68 105/79 133/68 123/69  Pulse: 74 73 65 64  Resp: 20 (!) '22 16 19  '$ Temp: 97.8 F (36.6 C)     TempSrc: Axillary     SpO2: 96% 94% 94% 93%  Weight:      Height:       Eyes: PERRL, lids and conjunctivae normal ENMT: Mucous membranes are moist. Posterior pharynx clear of any exudate or lesions.Normal dentition.  Neck: normal, supple, no masses, no thyromegaly Respiratory: clear to auscultation bilaterally, no wheezing, no crackles. Normal respiratory effort. No accessory muscle use.  Cardiovascular: Regular rate and rhythm, no murmurs / rubs / gallops. No extremity edema. 2+ pedal pulses. No carotid bruits.  Abdomen: no tenderness, no masses palpated. No hepatosplenomegaly. Bowel sounds positive.  Musculoskeletal: no clubbing / cyanosis. No joint deformity upper and lower extremities. Good ROM, no contractures. Normal muscle tone.  Skin: no rashes, lesions, ulcers. No induration Neurologic: No facial droops, moving all limbs, Psychiatric: Sedated    Labs on Admission: I have personally reviewed following labs and imaging studies  CBC: Recent Labs  Lab 08/03/22 0950  WBC 11.5*  NEUTROABS 9.3*  HGB 14.3  HCT 41.7  MCV 97.0  PLT 657   Basic Metabolic Panel: Recent Labs  Lab 08/03/22 1151  NA 138  K 3.6  CL 104  CO2 23  GLUCOSE 102*  BUN 18  CREATININE 0.84  CALCIUM 9.1   GFR: Estimated Creatinine Clearance: 110.4 mL/min (by C-G formula based on SCr of 0.84 mg/dL). Liver Function Tests: Recent Labs  Lab 08/03/22 1151  AST 27  ALT 18  ALKPHOS 55  BILITOT 1.0  PROT 6.9  ALBUMIN 4.1   No results for input(s): "LIPASE",  "AMYLASE" in the last 168 hours. No results for input(s): "AMMONIA" in the last 168 hours. Coagulation Profile: No results for input(s): "INR", "PROTIME" in the last 168 hours. Cardiac Enzymes: Recent Labs  Lab 08/03/22 0950  CKTOTAL 384   BNP (last 3 results) No results for input(s): "PROBNP" in the last 8760 hours. HbA1C: No results for input(s): "HGBA1C" in the last 72 hours. CBG: Recent Labs  Lab 08/03/22 0938  GLUCAP 102*   Lipid Profile: No results for input(s): "CHOL", "HDL", "LDLCALC", "TRIG", "CHOLHDL", "LDLDIRECT" in the last 72 hours. Thyroid Function Tests: No results for input(s): "TSH", "T4TOTAL", "FREET4", "T3FREE", "THYROIDAB" in the last 72 hours. Anemia Panel: No results for input(s): "VITAMINB12", "FOLATE", "FERRITIN", "TIBC", "IRON", "RETICCTPCT" in the last 72 hours. Urine analysis:    Component Value Date/Time   COLORURINE YELLOW 08/03/2022 1003   APPEARANCEUR HAZY (A) 08/03/2022 1003   APPEARANCEUR Clear 05/18/2021 1048   LABSPEC 1.025 08/03/2022 1003   PHURINE 5.0 08/03/2022 1003   GLUCOSEU NEGATIVE 08/03/2022 1003   HGBUR SMALL (A) 08/03/2022 1003   BILIRUBINUR NEGATIVE 08/03/2022 1003   BILIRUBINUR Negative 05/18/2021 1048   KETONESUR 5 (A) 08/03/2022 1003   PROTEINUR NEGATIVE 08/03/2022 1003   UROBILINOGEN 0.2 12/12/2019 0905   NITRITE NEGATIVE 08/03/2022 1003   LEUKOCYTESUR NEGATIVE 08/03/2022 1003    Radiological Exams on Admission: CT Head Wo Contrast  Result Date: 08/03/2022 CLINICAL DATA:  Altered mental status. EXAM: CT HEAD WITHOUT CONTRAST TECHNIQUE: Contiguous axial images were obtained from the base of the skull  through the vertex without intravenous contrast. RADIATION DOSE REDUCTION: This exam was performed according to the departmental dose-optimization program which includes automated exposure control, adjustment of the mA and/or kV according to patient size and/or use of iterative reconstruction technique. COMPARISON:  Head CT  September 14, 2021. FINDINGS: Brain: No evidence of acute infarction, hemorrhage, hydrocephalus, extra-axial collection or mass lesion/mass effect. Vascular: No hyperdense vessel or unexpected calcification. Skull: Normal. Negative for fracture or focal lesion. Sinuses/Orbits: Mild mucosal thickening of the ethmoid cells. The orbits are maintained. Other: None. IMPRESSION: No acute intracranial abnormality. Electronically Signed   By: Pedro Earls M.D.   On: 08/03/2022 12:47    EKG: Independently reviewed.  [place] rhythm, no acute ST changes.  Assessment/Plan Principal Problem:   Encephalopathy Active Problems:   Schizoaffective disorder (HCC)   Toxic encephalopathy   Suicidal ideation  (please populate well all problems here in Problem List. (For example, if patient is on BP meds at home and you resume or decide to hold them, it is a problem that needs to be her. Same for CAD, COPD, HLD and so on)  Acute toxic encephalopathy -Likely under the toxic influence of multidrug abuse, UDS positive for cocaine and benzos. -Currently patient is sedated, will use CIWA protocol with as needed benzos for agitation episode, plus as needed haloperidol.  Recheck EKG tomorrow -Aspiration precaution -Other DDx, very likely patient also overdosed Zyprexa.  Lookup etiology showed overdose less than 600 mg less likely to cause significant toxicity.  And patient was overdosed on 140 mg, less likely to cause significant toxicity alone. But will monitor neurotoxicity symptoms and signs such as experimental symptoms.  With frequent neurochecks hours and place patient in PCU for close monitoring. -CT head reviewed.  Suicidal ideation -Consult psychiatry, reevaluate when patient out of sedation.  Probably will need inpatient psychiatry this time. -Mother also concerned about unable to take care of patient anymore at home by herself.  At this point, will also consult case manager for possible placement  issue.  Bipolar disorder/schizoaffective disorder Continue current regimen of SSRI including BuSpar, Cymbalta, trazodone -Continue Depakote -Hold off Zyprexa for now, will use as needed haloperidol as above  CAD with past stenting -UDS positive for cocaine, will check troponin to rule out ACS -EKG showed no acute ST changes -Continue aspirin statin and Vasotec  DVT prophylaxis: Lovenox Code Status: Full code Family Communication: Mother over the phone Disposition Plan: Patient is sick with acute toxic encephalopathy and sedation and suicidal ideation, requiring inpatient management, expect more than 2 midnight hospital stay Consults called: Psychiatry Admission status: PCU   Lequita Halt MD [facility] Pager [phone]  08/03/2022, 3:27 PM

## 2022-08-03 NOTE — ED Notes (Signed)
Recollect for CMET and CK sent down to lab.

## 2022-08-03 NOTE — ED Provider Notes (Signed)
[facility] EMERGENCY DEPARTMENT Provider Note   CSN: [id number] Arrival date & time: 08/03/22  7371     History  Chief Complaint  Patient presents with   Agitation    Keith Gibbs is a 62 y.o. male.  62 year old male with history of polysubstance abuse presents with altered mental status from home.  Concern for possible drug ingestion.  Patient was found to be agitated and required 5 mg Versed by EMS.  Patient's blood sugar was above 100.  Patient transported here for further evaluation.  No further history obtainable       Home Medications Prior to Admission medications   Medication Sig Start Date End Date Taking? Authorizing Provider  albuterol (VENTOLIN HFA) 108 (90 Base) MCG/ACT inhaler Inhale 2 puffs into the lungs every 6 (six) hours as needed for wheezing or shortness of breath. 05/28/19   Charlott Rakes, MD  aspirin EC 81 MG tablet Take 1 tablet (81 mg total) by mouth daily. Swallow whole. 01/03/22   Gildardo Pounds, NP  atorvastatin (LIPITOR) 80 MG tablet Take 1 tablet (80 mg total) by mouth at bedtime. 05/01/22   Armando Reichert, MD  busPIRone (BUSPAR) 15 MG tablet Take 1 tablet (15 mg total) by mouth daily. 08/01/22   Salley Slaughter, NP  cyclobenzaprine (FLEXERIL) 10 MG tablet Take 1 tablet (10 mg total) by mouth 2 (two) times daily as needed for muscle spasms. 05/29/22   Domenic Moras, PA-C  diclofenac Sodium (VOLTAREN) 1 % GEL Apply 2 g topically 4 (four) times daily. 05/01/22   Armando Reichert, MD  divalproex (DEPAKOTE) 500 MG DR tablet Take 1 tablet (500 mg total) by mouth 2 (two) times daily. 08/01/22   Salley Slaughter, NP  docusate sodium (COLACE) 100 MG capsule Take 1 capsule (100 mg total) by mouth daily as needed for mild constipation. 05/01/22   Armando Reichert, MD  DULoxetine (CYMBALTA) 60 MG capsule Take 1 capsule (60 mg total) by mouth daily. 05/01/22   Armando Reichert, MD  enalapril (VASOTEC) 2.5 MG tablet Take 1 tablet (2.5 mg total) by mouth 2  (two) times daily. 05/01/22   Armando Reichert, MD  fluticasone (FLONASE) 50 MCG/ACT nasal spray Place 1 spray into both nostrils at bedtime. 05/01/22   Armando Reichert, MD  ibuprofen (ADVIL) 600 MG tablet Take 1 tablet (600 mg total) by mouth every 6 (six) hours as needed. 05/29/22   Domenic Moras, PA-C  loratadine (CLARITIN) 10 MG tablet Take 1 tablet (10 mg total) by mouth daily. 05/02/22   Armando Reichert, MD  meloxicam (MOBIC) 7.5 MG tablet Take 1 tablet (7.5 mg total) by mouth daily. 05/02/22   Armando Reichert, MD  Multiple Vitamin (MULTIVITAMIN WITH MINERALS) TABS tablet Take 1 tablet by mouth daily. 05/02/22   Armando Reichert, MD  nitroGLYCERIN (NITROSTAT) 0.3 MG SL tablet Place 1 tablet (0.3 mg total) under the tongue every 5 (five) minutes as needed for chest pain. Maximum of 3 doses daily. 05/01/22   Armando Reichert, MD  OLANZapine (ZYPREXA) 5 MG tablet Take 1 tablet (5 mg total) by mouth at bedtime. 08/01/22   Salley Slaughter, NP  omega-3 acid ethyl esters (LOVAZA) 1 g capsule Take 1 capsule (1 g total) by mouth daily. 05/02/22   Armando Reichert, MD  pantoprazole (PROTONIX) 20 MG tablet Take 1 tablet (20 mg total) by mouth daily for 15 days. 05/01/22 05/16/22  Armando Reichert, MD  predniSONE (DELTASONE) 20 MG tablet 3 tabs po day one, then  2 tabs daily x 4 days 05/29/22   Domenic Moras, PA-C  rOPINIRole (REQUIP) 2 MG tablet Take 1 tablet (2 mg total) by mouth at bedtime. TAKE 1 TABLET BY MOUTH EVERYDAY AT BEDTIME Strength: 2 mg 05/01/22   Armando Reichert, MD  tamsulosin (FLOMAX) 0.4 MG CAPS capsule Take 1 capsule (0.4 mg total) by mouth daily. 05/01/22   Armando Reichert, MD  thiamine (VITAMIN B-1) 100 MG tablet Take 1 tablet (100 mg total) by mouth daily. 05/02/22   Armando Reichert, MD  traZODone (DESYREL) 50 MG tablet Take 1 tablet (50 mg total) by mouth at bedtime as needed for sleep. 05/01/22   Armando Reichert, MD      Allergies    Anette Guarneri [lurasidone hcl] and Saphris [asenapine]    Review of Systems   Review of Systems   Unable to perform ROS: Mental status change    Physical Exam Updated Vital Signs BP (!) 180/74   Pulse 86   Temp 97.8 F (36.6 C) (Axillary)   Resp 18   Ht 1.905 m ('6\' 3"'$ )   Wt 95.3 kg   SpO2 97%   BMI 26.25 kg/m  Physical Exam Vitals and nursing note reviewed.  Constitutional:      General: He is not in acute distress.    Appearance: Normal appearance. He is well-developed. He is not toxic-appearing.  HENT:     Head: Normocephalic and atraumatic.  Eyes:     General: Lids are normal.     Conjunctiva/sclera: Conjunctivae normal.     Pupils: Pupils are equal, round, and reactive to light.     Comments: Pupils pinpoint bilateral  Neck:     Thyroid: No thyroid mass.     Trachea: No tracheal deviation.  Cardiovascular:     Rate and Rhythm: Normal rate and regular rhythm.     Heart sounds: Normal heart sounds. No murmur heard.    No gallop.  Pulmonary:     Effort: Pulmonary effort is normal. No respiratory distress.     Breath sounds: Normal breath sounds. No stridor. No decreased breath sounds, wheezing, rhonchi or rales.  Abdominal:     General: There is no distension.     Palpations: Abdomen is soft.     Tenderness: There is no abdominal tenderness. There is no rebound.  Musculoskeletal:        General: No tenderness. Normal range of motion.     Cervical back: Normal range of motion and neck supple.  Skin:    General: Skin is cool and dry.     Findings: No abrasion or rash.  Neurological:     Mental Status: He is lethargic, disoriented and confused.     GCS: GCS eye subscore is 4. GCS verbal subscore is 4. GCS motor subscore is 5.     Sensory: No sensory deficit.     Motor: No tremor or abnormal muscle tone.  Psychiatric:        Attention and Perception: He is inattentive.     ED Results / Procedures / Treatments   Labs (all labs ordered are listed, but only abnormal results are displayed) Labs Reviewed  CBG MONITORING, ED - Abnormal; Notable for the  following components:      Result Value   Glucose-Capillary 102 (*)    All other components within normal limits  CBC WITH DIFFERENTIAL/PLATELET  COMPREHENSIVE METABOLIC PANEL  RAPID URINE DRUG SCREEN, HOSP PERFORMED  ETHANOL  URINALYSIS, ROUTINE W REFLEX MICROSCOPIC    EKG EKG  Interpretation  Date/Time:  Wednesday August 03 2022 09:20:12 EST Ventricular Rate:  89 PR Interval:  155 QRS Duration: 103 QT Interval:  384 QTC Calculation: 468 R Axis:   61 Text Interpretation: Sinus rhythm Confirmed by Lacretia Leigh ([id number]) on 08/03/2022 9:41:25 AM  Radiology No results found.  Procedures Procedures    Medications Ordered in ED Medications  haloperidol lactate (HALDOL) injection 2 mg (has no administration in time range)  naloxone [facility]) injection 0.4 mg (0.4 mg Intravenous Given 08/03/22 1829)    ED Course/ Medical Decision Making/ A&P                           Medical Decision Making Amount and/or Complexity of Data Reviewed Labs: ordered. Radiology: ordered.  Risk Prescription drug management.   Patient is EKG per interpretation shows sinus tachycardia.  Patient had pinpoint pupils here.  Appear to be agitated.  Was given Narcan 0.4 mg x 2.  Slight improvement with this.  Did require Versed as well as Haldol for more sedation.  Was placed on 4-point restraints for his safety.  CBG was above 100 on initial evaluation.  UDS positive for cocaine and benzodiazepines.  Patient did receive Versed prior to arrival 5 mg.  Patient's Depakote level is subtherapeutic.  His CK is normal at 385.  No evidence of rhabdomyolysis.  CT of the head was performed due to altered mental status and per my interpretation no acute findings noted.  SPECT the patient has some component of a metabolic encephalopathy from probably polysubstance abuse.  Will require admission to the hospital at this time consult hospitalist team  CRITICAL CARE Performed by: Leota Jacobsen Total critical care  time: 60 minutes Critical care time was exclusive of separately billable procedures and treating other patients. Critical care was necessary to treat or prevent imminent or life-threatening deterioration. Critical care was time spent personally by me on the following activities: development of treatment plan with patient and/or surrogate as well as nursing, discussions with consultants, evaluation of patient's response to treatment, examination of patient, obtaining history from patient or surrogate, ordering and performing treatments and interventions, ordering and review of laboratory studies, ordering and review of radiographic studies, pulse oximetry and re-evaluation of patient's condition.         Final Clinical Impression(s) / ED Diagnoses Final diagnoses:  None    Rx / DC Orders ED Discharge Orders     None         Lacretia Leigh, MD 08/03/22 1318

## 2022-08-04 DIAGNOSIS — G9340 Encephalopathy, unspecified: Secondary | ICD-10-CM | POA: Diagnosis not present

## 2022-08-04 LAB — BASIC METABOLIC PANEL
Anion gap: 12 (ref 5–15)
BUN: 20 mg/dL (ref 8–23)
CO2: 23 mmol/L (ref 22–32)
Calcium: 8.6 mg/dL — ABNORMAL LOW (ref 8.9–10.3)
Chloride: 105 mmol/L (ref 98–111)
Creatinine, Ser: 0.87 mg/dL (ref 0.61–1.24)
GFR, Estimated: >60 mL/min (ref >60)
Glucose, Bld: 70 mg/dL (ref 70–99)
Potassium: 3.8 mmol/L (ref 3.5–5.1)
Sodium: 140 mmol/L (ref 135–145)

## 2022-08-04 MED ORDER — NICOTINE 21 MG/24HR TD PT24
21.0000 mg | MEDICATED_PATCH | Freq: Every day | TRANSDERMAL | Status: DC
  Administered 2022-08-04 – 2022-08-05 (×2): 21 mg via TRANSDERMAL
  Filled 2022-08-04 (×2): qty 1

## 2022-08-04 NOTE — Consult Note (Signed)
[facility] New Face-to-Face Psychiatric Evaluation   Name: Keith Gibbs DOB: 1960-09-10 MRN: [id number] Service Date: August 04, 2022 LOS:  LOS: 1 day  Reason for Consult: Suicidal ideation Referring Provider: Wynetta Fines, MD   Assessment  Keith Gibbs is a 62 y.o. male admitted medically for 08/03/2022  9:16 AM for He carries the psychiatric diagnoses of schizoaffective disorder- bipolar type and has a past medical history of unstable angina, spondylosis, restless leg syndrome, COPD, and GERD. Psychiatry was consulted for "suicidal ideation" by Wynetta Fines, MD.    His current presentation of depressed mood, poor energy, anhedonia, suicidal attempt, guilt, and auditory hallucinations stating "you are no good" in a distorted male voice that occurs "every now and then" with last occurrence a few days ago, nightmares, flashbacks, hypervigilance, and nonpsychotic paranoia is most consistent with MDD-recurrent/severe/with psychotic features and PTSD, less likely schizoaffective disorder as the auditory hallucinations are not consistent and seem to be more mood congruent even though they occur in periods of sobriety (continue to reassess) and visual hallucinations appear primarily in the setting of substance use. He meets criteria for inpatient psychiatric hospitalization based on suicide attempt leading to this admission and continued active SI with plan and intent.  See current outpatient psychotropic medications below.  He has not yet had the chance for response to these medications, as his regimen was adjusted on 08/01/2022. He was not fully compliant with medications prior to this appointment as evidenced by self discontinuation of Seroquel. On initial examination, patient appears dejected and voices worthlessness and hopelessness. Please see plan below for detailed recommendations.   Diagnoses:  Active Hospital problems: Principal Problem:   Encephalopathy Active Problems:    Schizoaffective disorder ([place])   Toxic encephalopathy   Suicidal ideation     Plan  ## Safety and Observation Level:  - Based on my clinical evaluation, I estimate the patient to be at extreme risk of self harm in the current setting - At this time, we recommend a continued 1:1 level of observation. This decision is based on my review of the chart including patient's history and current presentation, interview of the patient, mental status examination, and consideration of suicide risk including evaluating suicidal ideation, plan, intent, suicidal or self-harm behaviors, risk factors, and protective factors. This judgment is based on our ability to directly address suicide risk, implement suicide prevention strategies and develop a safety plan while the patient is in the clinical setting. Please contact our team if there is a concern that risk level has changed.   ## Medications:  -- Continue home BuSpar 15 mg daily -Continue home Depakote 500 mg twice daily  -Repeat VPA level on 1/13 -Continue home Cymbalta 60 mg daily - Hold home Zyprexa given overdose attempt -Start nicotine patch 21 mg daily for NRT  ## Medical Decision Making Capacity:  Not formally assessed at this time  ## Further Work-up:  -- Per primary team -- most recent EKG on 08/04/2022 had QtC of 446 -- Pertinent labwork reviewed earlier this admission includes: UDS positive for cocaine and benzodiazepines, VPA 31, WBC 11.5.  Labs otherwise WNL  ## Disposition:  -- Inpatient psychiatric hospitalization once medically stable  ## Behavioral / Environmental:  -- Surveyor, quantity  ##Legal Status -Voluntary  Thank you for this consult request. Recommendations have been communicated to the primary team.  We will continue to follow at this time.   [name], MD   Followup history  Relevant Aspects of Hospital Course:  Admitted on 08/03/2022 for altered mental status in the setting of presumed suicide  attempt.  Patient Report:  Patient reports that he was admitted due to suicide attempt in which he overdosed on home Zyprexa.  He reports that this is around the time of the death of his wife and daughter, and it is a particularly hard time.  He has limited social support, only noting support in his mom.  He otherwise has no further outside supports in that his "friends" are substance users.  He does follow-up with outpatient psychiatry at the [facility] behavior health care center, but does not have another appointment until April 2024.  Patient reports that he has nothing to live for, and that he "needs help or he will attempt suicide again and just may be successful the next time."  Patient reports that he does not like what he sees in the mirror, that he is ill and in pain, and that he is tired of using.  He is agreeable to inpatient psychiatric admission, as that is the recommendation.  Collateral information:  Per hospitalist, "At baseline, the patient lives with his mother.  Mother reported patient has had episode of worsening of his baseline psychiatry problems with frequent visual hallucinations, and suicidal ideas in last few weeks for which he was brought into psychiatry clinic 2 days ago.  When his psychiatry medication were adjusted, patient was started on Zyprexa 5 mg at bedtime as well as Depakote 500 mg twice daily.  Mother concerned about the patient restarted multidrug abuse behavior as patient left home yesterday without notifying her.  She also did a pill count while patient is absent at home and found patient probably took 28 of family grams of Zyprexa as only 2 tablets of the present left in the 30-day supply, with whole box intact Depakote at same time. This morning, patient came back with bizarre behavior, when he was in on the floor mumbling and not responding."   Psychiatric History:  Information collected from patient, chart Diagnoses:schizoaffective disorder, bipolar  disorder, depression, polysubstance use (heroin, cocaine, alcohol, marijuana),anxiety, and tobacco dependence.   Current medications:Zoloft 25 mg daily, Zyprexa 5 mg nightly, BuSpar 15 mg twice daily, and Depakote to 500 mg twice daily.  Patient was also prescribed Cymbalta 60 m daily, gabapentin 300 mg 3 times daily, and trazodone 100 mg nightly as needed which he gets from his PCP and pain specialist.  Past medication trials: Seroquel 200 mg   Social History:   Tobacco use: 1 PPD Alcohol use: 2- 40 ounce beers 1 time per week Drug use: Cocaine of varying amounts; most recently used $400 worth in 1 day.  Family History:   The patient's family history includes Depression in his mother; Early death in his father; Heart attack in his father; Heart disease in his father and mother; Hypertension in his father, mother, and sister; Osteoarthritis in his mother.  Medical History: Past Medical History:  Diagnosis Date   Anginal pain ([place])    Anxiety    Arthritis    Bipolar 1 disorder (HCC)    Bursitis    CAD (coronary artery disease)    Chronic pain    COPD (chronic obstructive pulmonary disease) (HCC)    Current use of long term anticoagulation    DAPT (ASA + clopidogrel)   Depression    Diverticulitis    Dyspnea    GERD (gastroesophageal reflux disease)    Grade I diastolic dysfunction    Hepatitis C 2012  No longer has Hep C   HLD (hyperlipidemia)    Hypertension    MI (myocardial infarction) ([place])    Polysubstance abuse (HCC)    cocaine, marijuana, ETOH   PUD (peptic ulcer disease)    S/P angioplasty with stent 06/10/2016   a.) 90% stenosis of pLAD to mLAD - 2.5 x 18 mm Xience Alpine (DES x 1) placed to pLAD   S/P PTCA (percutaneous transluminal coronary angioplasty) 12/04/2019   a.) 60% in stent restenosis of DES to pLAD; LVEF 65%.   Schizophrenia ([place])    Stroke [facility])    Valvular insufficiency    a.) Mild MR, TR, PR; mild to moderate AR on 03/05/2018 TTE    Surgical  History: Past Surgical History:  Procedure Laterality Date   ABDOMINAL SURGERY     removed small piece of intestines due to [facility] Diverticulosis   APPENDECTOMY     BACK SURGERY     CARDIAC CATHETERIZATION Left 06/10/2016   Procedure: Left Heart Cath and Coronary Angiography;  Surgeon: Dionisio David, MD;  Location: [place] CV [facility];  Service: Cardiovascular;  Laterality: Left;   CARDIAC CATHETERIZATION N/A 06/10/2016   Procedure: Coronary Stent Intervention;  Surgeon: Yolonda Kida, MD;  Location: [place] [facility];  Service: Cardiovascular;  Laterality: N/A;   CHOLECYSTECTOMY N/A 02/17/2022   Procedure: LAPAROSCOPIC CHOLECYSTECTOMY;  Surgeon: Kieth Brightly Arta Bruce, MD;  Location: [place];  Service: General;  Laterality: N/A;   COLON SURGERY     COLONOSCOPY     COLONOSCOPY WITH PROPOFOL N/A 01/05/2017   Procedure: COLONOSCOPY WITH PROPOFOL;  Surgeon: Jonathon Bellows, MD;  Location: [facility] ENDOSCOPY;  Service: Endoscopy;  Laterality: N/A;   COLONOSCOPY WITH PROPOFOL N/A 02/13/2020   Procedure: COLONOSCOPY WITH PROPOFOL;  Surgeon: Virgel Manifold, MD;  Location: [facility] ENDOSCOPY;  Service: Endoscopy;  Laterality: N/A;   CORONARY ANGIOPLASTY WITH STENT PLACEMENT     ESOPHAGOGASTRODUODENOSCOPY (EGD) WITH PROPOFOL N/A 01/05/2017   Procedure: ESOPHAGOGASTRODUODENOSCOPY (EGD) WITH PROPOFOL;  Surgeon: Jonathon Bellows, MD;  Location: [facility] ENDOSCOPY;  Service: Endoscopy;  Laterality: N/A;   ESOPHAGOGASTRODUODENOSCOPY (EGD) WITH PROPOFOL N/A 02/13/2020   Procedure: ESOPHAGOGASTRODUODENOSCOPY (EGD) WITH PROPOFOL;  Surgeon: Virgel Manifold, MD;  Location: [facility] ENDOSCOPY;  Service: Endoscopy;  Laterality: N/A;   INTRAVASCULAR PRESSURE WIRE/FFR STUDY N/A 12/04/2019   Procedure: INTRAVASCULAR PRESSURE WIRE/FFR STUDY;  Surgeon: Sherren Mocha, MD;  Location: [place] [facility];  Service: Cardiovascular;  Laterality: N/A;   KNEE ARTHROSCOPY WITH MEDIAL MENISECTOMY Right 09/05/2017   Procedure: KNEE  ARTHROSCOPY WITH MEDIAL AND LATERAL  MENISECTOMY PARTIAL SYNOVECTOMY;  Surgeon: Hessie Knows, MD;  Location: [facility] ORS;  Service: Orthopedics;  Laterality: Right;   LEFT HEART CATH AND CORONARY ANGIOGRAPHY N/A 12/04/2019   Procedure: LEFT HEART CATH AND CORONARY ANGIOGRAPHY;  Surgeon: Sherren Mocha, MD;  Location: [place] [facility];  Service: Cardiovascular;  Laterality: N/A;   SHOULDER SURGERY Right 04/09/2012   SPINE SURGERY     TOTAL KNEE ARTHROPLASTY Right 01/19/2021   Procedure: TOTAL KNEE ARTHROPLASTY - Rachelle Hora to Assist;  Surgeon: Hessie Knows, MD;  Location: [facility] ORS;  Service: Orthopedics;  Laterality: Right;    Medications:   Current Facility-Administered Medications:    0.9 %  sodium chloride infusion, , Intravenous, Continuous, Wynetta Fines T, MD, Last Rate: 125 mL/hr at 08/03/22 2330, New Bag at 08/03/22 2330   albuterol (PROVENTIL) (2.5 MG/3ML) 0.083% nebulizer solution 3 mL, 3 mL, Inhalation, Q6H PRN, Lequita Halt, MD   aspirin EC tablet  81 mg, 81 mg, Oral, Daily, Zhang, Ping T, MD   atorvastatin (LIPITOR) tablet 80 mg, 80 mg, Oral, QHS, Zhang, Ping T, MD   busPIRone (BUSPAR) tablet 15 mg, 15 mg, Oral, Daily, [place], Ping T, MD, 15 mg at 08/04/22 1037   cyclobenzaprine (FLEXERIL) tablet 10 mg, 10 mg, Oral, BID PRN, Wynetta Fines T, MD   divalproex (DEPAKOTE) DR tablet 500 mg, 500 mg, Oral, BID, Wynetta Fines T, MD, 500 mg at 08/04/22 1036   docusate sodium (COLACE) capsule 100 mg, 100 mg, Oral, Daily PRN, Wynetta Fines T, MD   DULoxetine (CYMBALTA) DR capsule 60 mg, 60 mg, Oral, Daily, [place], Ping T, MD, 60 mg at 08/04/22 1037   enalapril (VASOTEC) tablet 2.5 mg, 2.5 mg, Oral, BID, [place], Ping T, MD, 2.5 mg at 08/04/22 1048   enoxaparin (LOVENOX) injection 40 mg, 40 mg, Subcutaneous, Q24H, Wynetta Fines T, MD, 40 mg at 08/03/22 2145   fluticasone (FLONASE) 50 MCG/ACT nasal spray 1 spray, 1 spray, Each Nare, Daily, Wynetta Fines T, MD, 1 spray at 42/70/62 3762   folic acid  (FOLVITE) tablet 1 mg, 1 mg, Oral, Daily, [place], Ping T, MD, 1 mg at 08/04/22 1036   haloperidol lactate (HALDOL) injection 5 mg, 5 mg, Intravenous, Q6H PRN, Wynetta Fines T, MD, 5 mg at 08/04/22 1057   ibuprofen (ADVIL) tablet 600 mg, 600 mg, Oral, Q6H PRN, Wynetta Fines T, MD, 600 mg at 08/04/22 1048   loratadine (CLARITIN) tablet 10 mg, 10 mg, Oral, Daily, [place], Ping T, MD, 10 mg at 08/04/22 1040   LORazepam (ATIVAN) tablet 1-4 mg, 1-4 mg, Oral, Q1H PRN **OR** LORazepam (ATIVAN) injection 1-4 mg, 1-4 mg, Intravenous, Q1H PRN, Wynetta Fines T, MD   meloxicam (MOBIC) tablet 7.5 mg, 7.5 mg, Oral, Daily, [place], Ping T, MD, 7.5 mg at 08/04/22 1036   multivitamin with minerals tablet 1 tablet, 1 tablet, Oral, Daily, Wynetta Fines T, MD   multivitamin with minerals tablet 1 tablet, 1 tablet, Oral, Daily, Wynetta Fines T, MD, 1 tablet at 08/04/22 1036   Oral care mouth rinse, 15 mL, Mouth Rinse, PRN, Opyd, Ilene Qua, MD   pantoprazole (PROTONIX) EC tablet 20 mg, 20 mg, Oral, Daily, [place], Ping T, MD, 20 mg at 08/04/22 1041   rOPINIRole (REQUIP) tablet 2 mg, 2 mg, Oral, QHS, Zhang, Pearletha Forge T, MD   tamsulosin [facility]) capsule 0.4 mg, 0.4 mg, Oral, Daily, [place], Ping T, MD, 0.4 mg at 08/04/22 1036   thiamine (VITAMIN B1) tablet 100 mg, 100 mg, Oral, Daily **OR** thiamine (VITAMIN B1) injection 100 mg, 100 mg, Intravenous, Daily, [place], Ping T, MD, 100 mg at 08/03/22 1509   thiamine (VITAMIN B1) tablet 100 mg, 100 mg, Oral, Daily, Wynetta Fines T, MD, 100 mg at 08/04/22 1036   traZODone (DESYREL) tablet 50 mg, 50 mg, Oral, QHS PRN, Lequita Halt, MD  Allergies: Allergies  Allergen Reactions   Latuda [Lurasidone Hcl] Other (See Comments)    Tremors     Saphris [Asenapine] Other (See Comments)    Increased tremors        Objective  Vital signs:  Temp:  [97.7 F (36.5 C)-98.6 F (37 C)] 98 F (36.7 C) (01/11 1200) Pulse Rate:  [45-74] 66 (01/11 1200) Resp:  [12-26] 20 (01/11 1200) BP: (101-144)/(58-79)  101/58 (01/11 1200) SpO2:  [93 %-100 %] 98 % (01/11 1200) Weight:  [85.1 kg] 85.1 kg (01/11 0530)  Psychiatric Specialty Exam:  Presentation  General Appearance:  Appropriate for  Environment; Casual  Eye Contact: Good  Speech: Clear and Coherent; Normal Rate  Speech Volume: Decreased  Handedness: Right   Mood and Affect  Mood: Depressed; Dysphoric  Affect: Congruent; Depressed   Thought Process  Thought Processes: Coherent; Goal Directed; Linear  Descriptions of Associations:Intact  Orientation:Full (Time, Place and Person)  Thought Content:Logical  History of Schizophrenia/Schizoaffective disorder:No data recorded Duration of Psychotic Symptoms:No data recorded Hallucinations:Hallucinations: None  Ideas of Reference:None  Suicidal Thoughts:Suicidal Thoughts: Yes, Active SI Active Intent and/or Plan: With Intent; With Plan; With Means to Carry Out; With Access to Means  Homicidal Thoughts:Homicidal Thoughts: No   Sensorium  Memory: Immediate Good; Recent Fair  Judgment: Poor  Insight: Fair   Materials engineer: Fair  Attention Span: Fair  Recall: [employer] of Knowledge: Fair  Language: Fair   Psychomotor Activity  Psychomotor Activity:Psychomotor Activity: Normal   Assets  Assets: Armed forces logistics/support/administrative officer; Desire for Improvement; Housing; Social Support   Sleep  Sleep:Sleep: Fair    Physical Exam: Physical Exam Vitals reviewed.  Constitutional:      General: He is not in acute distress.    Appearance: He is ill-appearing.     Comments: chronically  HENT:     Head: Normocephalic and atraumatic.     Mouth/Throat:     Mouth: Mucous membranes are moist.     Pharynx: Oropharynx is clear.  Cardiovascular:     Rate and Rhythm: Bradycardia present.  Pulmonary:     Effort: Pulmonary effort is normal.  Skin:    General: Skin is warm and dry.  Neurological:     General: No focal deficit present.      Mental Status: He is alert and oriented to person, place, and time.    Review of Systems  Psychiatric/Behavioral:  Positive for depression, substance abuse and suicidal ideas. Negative for hallucinations. The patient does not have insomnia.    Blood pressure (!) 101/58, pulse 66, temperature 98 F (36.7 C), temperature source Oral, resp. rate 20, height '6\' 3"'$  (1.905 m), weight 85.1 kg, SpO2 98 %. Body mass index is 23.45 kg/m.

## 2022-08-04 NOTE — TOC Progression Note (Signed)
Transition of Care [facility]) - Progression Note    Patient Details  Name: NIL XIONG MRN: [id number] Date of Birth: 01-21-1961  Transition of Care [facility]) CM/SW Contact  Jinger Neighbors, [place] Phone Number: 08/04/2022, 4:02 PM  Clinical Narrative:     CSW messaged Teneka at [facility] to ask for patient to be reviewed for [facility], since patient needs inpatient psych, once medically stable.        Expected Discharge Plan and Services                                               Social Determinants of Health (SDOH) Interventions SDOH Screenings   Alcohol Screen: Low Risk  (06/21/2021)  Depression (PHQ2-9): High Risk (08/01/2022)  Tobacco Use: High Risk (08/01/2022)    Readmission Risk Interventions    03/01/2022   11:27 AM  Readmission Risk Prevention Plan  Transportation Screening Complete  HRI or Home Care Consult Complete  Social Work Consult for [place] Planning/Counseling Complete  Palliative Care Screening Not Applicable  Medication Review Press photographer) Complete

## 2022-08-04 NOTE — Progress Notes (Signed)
PROGRESS NOTE    Keith Gibbs  JSH:[id number] DOB: 1960/11/27 DOA: 08/03/2022 PCP: Gildardo Pounds, NP   Brief Narrative:  Keith Gibbs is a 62 y.o. male with medical history significant of polysubstance abuse, bipolar disorder, schizoaffective disorder, BPH, COPD, CAD status post LAD stenting, sent for by family member for altered mentation/combative nature.  Patient admitted 8/50/2774 with metabolic encephalopathy, after further history obtained from family it appears patient had been having worsening psychiatric problems from his baseline with increased hallucinations and suicidal ideations -he required multiple sedatives in the ED at intake to maintain patient's safety.  Patient now more awake today admits overtly to intentionally overdosing on his home medications in an attempt for suicide.  Hospitalist called for admission, psych called for consult.    Assessment & Plan:   Principal Problem:   Encephalopathy Active Problems:   Schizoaffective disorder (HCC)   Toxic encephalopathy   Suicidal ideation  Acute toxic encephalopathy Secondary to intentional overdose/polypharmacy as below -Exacerbated by patient's underlying psychiatric history -UDS positive for cocaine and benzos -Mental status resolving, patient appears to be back to baseline today alert and oriented x 4 -CIWA protocol ongoing -CT head -no acute process   Suicidal ideation and attempt via intentional drug overdose -Psychiatry following, appreciate insight recommendations, likely transition to inpatient psych in the next 24 to 48 hours pending clinical status   Bipolar disorder/schizoaffective disorder Home regimen includes BuSpar, Cymbalta, trazodone and Depakote Defer to psychiatry for further medication adjustments   CAD with past stenting, stable -Troponin unremarkable, EKG without overt ST elevations -Continue aspirin statin and Vasotec -No further workup indicated at this time  DVT prophylaxis:  Lovenox Code Status: Full Family Communication: Mother updated  Status is: Inpatient  Dispo: The patient is from: Home              Anticipated d/c is to: Inpatient psych              Anticipated d/c date is: 24 to 48 hours              Patient currently not medically stable for discharge, awaiting safe disposition and to ensure medically cleared will follow patient overnight monitor for any ongoing withdrawal symptoms or complications from polypharmacy  Consultants:  Psychiatry  Procedures:  None  Antimicrobials:  None indicated  Subjective: No acute issues or events overnight, patient much more awake and alert today, back to baseline, at bedside review of systems unremarkable other than patient decays he is hungry  Objective: Vitals:   08/03/22 2330 08/04/22 0428 08/04/22 0530 08/04/22 0753  BP: 134/72 114/69  126/69  Pulse: (!) 45 (!) 48  (!) 52  Resp: '15 15  18  '$ Temp: 98 F (36.7 C) 98.6 F (37 C)  98.4 F (36.9 C)  TempSrc: Axillary Axillary  Axillary  SpO2: 100% 98%  96%  Weight:   85.1 kg   Height:        Intake/Output Summary (Last 24 hours) at 08/04/2022 0800 Last data filed at 08/04/2022 0515 Gross per 24 hour  Intake 750 ml  Output 700 ml  Net 50 ml   Filed Weights   08/03/22 0924 08/04/22 0530  Weight: 95.3 kg 85.1 kg    Examination:  General:  Pleasantly resting in bed, No acute distress. HEENT:  Normocephalic atraumatic.  Sclerae nonicteric, noninjected.  Extraocular movements intact bilaterally. Neck:  Without mass or deformity.  Trachea is midline. Lungs:  Clear to auscultate bilaterally without rhonchi, wheeze,  or rales. Heart:  Regular rate and rhythm.  Without murmurs, rubs, or gallops. Abdomen:  Soft, nontender, nondistended.  Without guarding or rebound. Extremities: Without cyanosis, clubbing, edema, or obvious deformity.  Data Reviewed: I have personally reviewed following labs and imaging studies  CBC: Recent Labs  Lab  08/03/22 0950  WBC 11.5*  NEUTROABS 9.3*  HGB 14.3  HCT 41.7  MCV 97.0  PLT 814   Basic Metabolic Panel: Recent Labs  Lab 08/03/22 1151 08/04/22 0431  NA 138 140  K 3.6 3.8  CL 104 105  CO2 23 23  GLUCOSE 102* 70  BUN 18 20  CREATININE 0.84 0.87  CALCIUM 9.1 8.6*   GFR: Estimated Creatinine Clearance: 106.6 mL/min (by C-G formula based on SCr of 0.87 mg/dL). Liver Function Tests: Recent Labs  Lab 08/03/22 1151  AST 27  ALT 18  ALKPHOS 55  BILITOT 1.0  PROT 6.9  ALBUMIN 4.1   No results for input(s): "LIPASE", "AMYLASE" in the last 168 hours. No results for input(s): "AMMONIA" in the last 168 hours. Coagulation Profile: No results for input(s): "INR", "PROTIME" in the last 168 hours. Cardiac Enzymes: Recent Labs  Lab 08/03/22 0950  CKTOTAL 384   BNP (last 3 results) No results for input(s): "PROBNP" in the last 8760 hours. HbA1C: No results for input(s): "HGBA1C" in the last 72 hours. CBG: Recent Labs  Lab 08/03/22 0938  GLUCAP 102*   Lipid Profile: No results for input(s): "CHOL", "HDL", "LDLCALC", "TRIG", "CHOLHDL", "LDLDIRECT" in the last 72 hours. Thyroid Function Tests: Recent Labs    08/03/22 1616 08/03/22 1641  TSH 0.420  --   FREET4  --  0.92   Anemia Panel: No results for input(s): "VITAMINB12", "FOLATE", "FERRITIN", "TIBC", "IRON", "RETICCTPCT" in the last 72 hours. Sepsis Labs: No results for input(s): "PROCALCITON", "LATICACIDVEN" in the last 168 hours.  No results found for this or any previous visit (from the past 240 hour(s)).       Radiology Studies: CT Head Wo Contrast  Result Date: 08/03/2022 CLINICAL DATA:  Altered mental status. EXAM: CT HEAD WITHOUT CONTRAST TECHNIQUE: Contiguous axial images were obtained from the base of the skull through the vertex without intravenous contrast. RADIATION DOSE REDUCTION: This exam was performed according to the departmental dose-optimization program which includes automated exposure  control, adjustment of the mA and/or kV according to patient size and/or use of iterative reconstruction technique. COMPARISON:  Head CT September 14, 2021. FINDINGS: Brain: No evidence of acute infarction, hemorrhage, hydrocephalus, extra-axial collection or mass lesion/mass effect. Vascular: No hyperdense vessel or unexpected calcification. Skull: Normal. Negative for fracture or focal lesion. Sinuses/Orbits: Mild mucosal thickening of the ethmoid cells. The orbits are maintained. Other: None. IMPRESSION: No acute intracranial abnormality. Electronically Signed   By: Pedro Earls M.D.   On: 08/03/2022 12:47        Scheduled Meds:  aspirin EC  81 mg Oral Daily   atorvastatin  80 mg Oral QHS   busPIRone  15 mg Oral Daily   divalproex  500 mg Oral BID   DULoxetine  60 mg Oral Daily   enalapril  2.5 mg Oral BID   enoxaparin (LOVENOX) injection  40 mg Subcutaneous Q24H   fluticasone  1 spray Each Nare Daily   folic acid  1 mg Oral Daily   loratadine  10 mg Oral Daily   meloxicam  7.5 mg Oral Daily   multivitamin with minerals  1 tablet Oral Daily   multivitamin  with minerals  1 tablet Oral Daily   pantoprazole  20 mg Oral Daily   rOPINIRole  2 mg Oral QHS   tamsulosin  0.4 mg Oral Daily   thiamine  100 mg Oral Daily   Or   thiamine  100 mg Intravenous Daily   thiamine  100 mg Oral Daily   Continuous Infusions:  sodium chloride 125 mL/hr at 08/03/22 2330     LOS: 1 day   Time spent: 31mn  Markees Carns C Indiana Gamero, DO [facility]  If 7PM-7AM, please contact night-coverage [email or web address]  08/04/2022, 8:00 AM

## 2022-08-05 ENCOUNTER — Other Ambulatory Visit: Payer: Self-pay

## 2022-08-05 ENCOUNTER — Inpatient Hospital Stay (HOSPITAL_COMMUNITY)
Admission: AD | Admit: 2022-08-05 | Discharge: 2022-08-10 | DRG: 897 | Disposition: A | Discharge status: Home / Self Care | Payer: Medicaid Other | Source: Intra-hospital | Attending: Psychiatry | Admitting: Psychiatry

## 2022-08-05 ENCOUNTER — Encounter (HOSPITAL_COMMUNITY): Payer: Self-pay | Admitting: Internal Medicine

## 2022-08-05 ENCOUNTER — Encounter (HOSPITAL_COMMUNITY): Payer: Self-pay | Admitting: Psychiatry

## 2022-08-05 DIAGNOSIS — E785 Hyperlipidemia, unspecified: Secondary | ICD-10-CM | POA: Diagnosis present

## 2022-08-05 DIAGNOSIS — Z955 Presence of coronary angioplasty implant and graft: Secondary | ICD-10-CM

## 2022-08-05 DIAGNOSIS — Z8711 Personal history of peptic ulcer disease: Secondary | ICD-10-CM

## 2022-08-05 DIAGNOSIS — F329 Major depressive disorder, single episode, unspecified: Secondary | ICD-10-CM | POA: Diagnosis present

## 2022-08-05 DIAGNOSIS — F250 Schizoaffective disorder, bipolar type: Secondary | ICD-10-CM | POA: Diagnosis present

## 2022-08-05 DIAGNOSIS — G894 Chronic pain syndrome: Secondary | ICD-10-CM | POA: Diagnosis present

## 2022-08-05 DIAGNOSIS — G2581 Restless legs syndrome: Secondary | ICD-10-CM | POA: Diagnosis present

## 2022-08-05 DIAGNOSIS — Z79899 Other long term (current) drug therapy: Secondary | ICD-10-CM

## 2022-08-05 DIAGNOSIS — Z8673 Personal history of transient ischemic attack (TIA), and cerebral infarction without residual deficits: Secondary | ICD-10-CM

## 2022-08-05 DIAGNOSIS — F6381 Intermittent explosive disorder: Secondary | ICD-10-CM | POA: Diagnosis present

## 2022-08-05 DIAGNOSIS — I10 Essential (primary) hypertension: Secondary | ICD-10-CM | POA: Diagnosis present

## 2022-08-05 DIAGNOSIS — H1010 Acute atopic conjunctivitis, unspecified eye: Secondary | ICD-10-CM | POA: Diagnosis present

## 2022-08-05 DIAGNOSIS — I951 Orthostatic hypotension: Secondary | ICD-10-CM | POA: Diagnosis present

## 2022-08-05 DIAGNOSIS — Z7982 Long term (current) use of aspirin: Secondary | ICD-10-CM | POA: Diagnosis not present

## 2022-08-05 DIAGNOSIS — F1490 Cocaine use, unspecified, uncomplicated: Secondary | ICD-10-CM | POA: Diagnosis present

## 2022-08-05 DIAGNOSIS — Z23 Encounter for immunization: Secondary | ICD-10-CM

## 2022-08-05 DIAGNOSIS — F17210 Nicotine dependence, cigarettes, uncomplicated: Secondary | ICD-10-CM | POA: Diagnosis present

## 2022-08-05 DIAGNOSIS — J4489 Other specified chronic obstructive pulmonary disease: Secondary | ICD-10-CM | POA: Diagnosis present

## 2022-08-05 DIAGNOSIS — T50902A Poisoning by unspecified drugs, medicaments and biological substances, intentional self-harm, initial encounter: Secondary | ICD-10-CM | POA: Insufficient documentation

## 2022-08-05 DIAGNOSIS — F411 Generalized anxiety disorder: Secondary | ICD-10-CM | POA: Diagnosis present

## 2022-08-05 DIAGNOSIS — I25119 Atherosclerotic heart disease of native coronary artery with unspecified angina pectoris: Secondary | ICD-10-CM | POA: Diagnosis present

## 2022-08-05 DIAGNOSIS — F1414 Cocaine abuse with cocaine-induced mood disorder: Principal | ICD-10-CM | POA: Diagnosis present

## 2022-08-05 DIAGNOSIS — F1994 Other psychoactive substance use, unspecified with psychoactive substance-induced mood disorder: Secondary | ICD-10-CM | POA: Diagnosis not present

## 2022-08-05 DIAGNOSIS — Z7901 Long term (current) use of anticoagulants: Secondary | ICD-10-CM

## 2022-08-05 DIAGNOSIS — Z818 Family history of other mental and behavioral disorders: Secondary | ICD-10-CM

## 2022-08-05 DIAGNOSIS — Z96651 Presence of right artificial knee joint: Secondary | ICD-10-CM | POA: Diagnosis present

## 2022-08-05 DIAGNOSIS — I2089 Other forms of angina pectoris: Secondary | ICD-10-CM | POA: Diagnosis present

## 2022-08-05 DIAGNOSIS — I2510 Atherosclerotic heart disease of native coronary artery without angina pectoris: Secondary | ICD-10-CM

## 2022-08-05 DIAGNOSIS — T43592A Poisoning by other antipsychotics and neuroleptics, intentional self-harm, initial encounter: Secondary | ICD-10-CM | POA: Diagnosis present

## 2022-08-05 DIAGNOSIS — G9340 Encephalopathy, unspecified: Secondary | ICD-10-CM | POA: Diagnosis not present

## 2022-08-05 LAB — CBC
HCT: 38.4 % — ABNORMAL LOW (ref 39.0–52.0)
Hemoglobin: 12.7 g/dL — ABNORMAL LOW (ref 13.0–17.0)
MCH: 32.5 pg (ref 26.0–34.0)
MCHC: 33.1 g/dL (ref 30.0–36.0)
MCV: 98.2 fL (ref 80.0–100.0)
Platelets: 175 10*3/uL (ref 150–400)
RBC: 3.91 MIL/uL — ABNORMAL LOW (ref 4.22–5.81)
RDW: 14.1 % (ref 11.5–15.5)
WBC: 7 10*3/uL (ref 4.0–10.5)
nRBC: 0 % (ref 0.0–0.2)

## 2022-08-05 LAB — COMPREHENSIVE METABOLIC PANEL
ALT: 18 U/L (ref 0–44)
AST: 22 U/L (ref 15–41)
Albumin: 3 g/dL — ABNORMAL LOW (ref 3.5–5.0)
Alkaline Phosphatase: 48 U/L (ref 38–126)
Anion gap: 7 (ref 5–15)
BUN: 15 mg/dL (ref 8–23)
CO2: 24 mmol/L (ref 22–32)
Calcium: 8.3 mg/dL — ABNORMAL LOW (ref 8.9–10.3)
Chloride: 108 mmol/L (ref 98–111)
Creatinine, Ser: 0.99 mg/dL (ref 0.61–1.24)
GFR, Estimated: >60 mL/min (ref >60)
Glucose, Bld: 114 mg/dL — ABNORMAL HIGH (ref 70–99)
Potassium: 3.9 mmol/L (ref 3.5–5.1)
Sodium: 139 mmol/L (ref 135–145)
Total Bilirubin: 0.4 mg/dL (ref 0.3–1.2)
Total Protein: 5.4 g/dL — ABNORMAL LOW (ref 6.5–8.1)

## 2022-08-05 LAB — RESP PANEL BY RT-PCR (RSV, FLU A&B, COVID)  RVPGX2
Influenza A by PCR: NEGATIVE
Influenza B by PCR: NEGATIVE
Resp Syncytial Virus by PCR: NEGATIVE
SARS Coronavirus 2 by RT PCR: NEGATIVE

## 2022-08-05 MED ORDER — DULOXETINE HCL 60 MG PO CPEP
60.0000 mg | ORAL_CAPSULE | Freq: Every day | ORAL | Status: DC
  Administered 2022-08-06: 60 mg via ORAL
  Filled 2022-08-05 (×4): qty 1

## 2022-08-05 MED ORDER — INFLUENZA VAC A&B SA ADJ QUAD 0.5 ML IM PRSY: 0.5000 mL | PREFILLED_SYRINGE | INTRAMUSCULAR | Status: DC

## 2022-08-05 MED ORDER — IBUPROFEN 600 MG PO TABS
600.0000 mg | ORAL_TABLET | Freq: Four times a day (QID) | ORAL | Status: DC | PRN
  Administered 2022-08-05 – 2022-08-10 (×8): 600 mg via ORAL
  Filled 2022-08-05 (×8): qty 1

## 2022-08-05 MED ORDER — TRAZODONE HCL 50 MG PO TABS: 50.0000 mg | ORAL_TABLET | Freq: Every evening | ORAL | Status: DC | PRN

## 2022-08-05 MED ORDER — ENOXAPARIN SODIUM 40 MG/0.4ML IJ SOSY
40.0000 mg | PREFILLED_SYRINGE | INTRAMUSCULAR | Status: DC
  Filled 2022-08-05 (×2): qty 0.4

## 2022-08-05 MED ORDER — LORAZEPAM 2 MG/ML IJ SOLN: 1.0000 mg | INTRAMUSCULAR | Status: DC | PRN

## 2022-08-05 MED ORDER — ADULT MULTIVITAMIN W/MINERALS CH: 1.0000 | ORAL_TABLET | Freq: Every day | ORAL | Status: DC

## 2022-08-05 MED ORDER — ORAL CARE MOUTH RINSE: 15.0000 mL | OROMUCOSAL | Status: DC | PRN

## 2022-08-05 MED ORDER — ENALAPRIL MALEATE 2.5 MG PO TABS
2.5000 mg | ORAL_TABLET | Freq: Two times a day (BID) | ORAL | Status: DC
  Administered 2022-08-06 – 2022-08-09 (×7): 2.5 mg via ORAL
  Filled 2022-08-05 (×16): qty 1

## 2022-08-05 MED ORDER — NICOTINE 21 MG/24HR TD PT24
21.0000 mg | MEDICATED_PATCH | Freq: Every day | TRANSDERMAL | Status: DC
  Administered 2022-08-06 – 2022-08-09 (×4): 21 mg via TRANSDERMAL
  Filled 2022-08-05 (×8): qty 1

## 2022-08-05 MED ORDER — DIVALPROEX SODIUM 500 MG PO DR TAB
500.0000 mg | DELAYED_RELEASE_TABLET | Freq: Two times a day (BID) | ORAL | Status: DC
  Administered 2022-08-05 – 2022-08-10 (×10): 500 mg via ORAL
  Filled 2022-08-05 (×17): qty 1

## 2022-08-05 MED ORDER — ROPINIROLE HCL 1 MG PO TABS
2.0000 mg | ORAL_TABLET | Freq: Every day | ORAL | Status: DC
  Administered 2022-08-05 – 2022-08-06 (×2): 2 mg via ORAL
  Filled 2022-08-05 (×6): qty 2

## 2022-08-05 MED ORDER — MELOXICAM 7.5 MG PO TABS
7.5000 mg | ORAL_TABLET | Freq: Every day | ORAL | Status: DC
  Administered 2022-08-06 – 2022-08-10 (×5): 7.5 mg via ORAL
  Filled 2022-08-05 (×8): qty 1

## 2022-08-05 MED ORDER — PANTOPRAZOLE SODIUM 20 MG PO TBEC
20.0000 mg | DELAYED_RELEASE_TABLET | Freq: Every day | ORAL | Status: DC
  Administered 2022-08-06 – 2022-08-10 (×5): 20 mg via ORAL
  Filled 2022-08-05 (×8): qty 1

## 2022-08-05 MED ORDER — ADULT MULTIVITAMIN W/MINERALS CH
1.0000 | ORAL_TABLET | Freq: Every day | ORAL | Status: DC
  Administered 2022-08-06 – 2022-08-10 (×5): 1 via ORAL
  Filled 2022-08-05 (×8): qty 1

## 2022-08-05 MED ORDER — LORATADINE 10 MG PO TABS
10.0000 mg | ORAL_TABLET | Freq: Every day | ORAL | Status: DC
  Administered 2022-08-06 – 2022-08-10 (×5): 10 mg via ORAL
  Filled 2022-08-05 (×8): qty 1

## 2022-08-05 MED ORDER — ENOXAPARIN SODIUM 40 MG/0.4ML IJ SOSY
40.0000 mg | PREFILLED_SYRINGE | INTRAMUSCULAR | Status: DC
  Filled 2022-08-05: qty 0.4

## 2022-08-05 MED ORDER — ASPIRIN 81 MG PO TBEC
81.0000 mg | DELAYED_RELEASE_TABLET | Freq: Every day | ORAL | Status: DC
  Administered 2022-08-06 – 2022-08-10 (×5): 81 mg via ORAL
  Filled 2022-08-05 (×10): qty 1

## 2022-08-05 MED ORDER — VITAMIN B-1 100 MG PO TABS: 100.0000 mg | ORAL_TABLET | Freq: Every day | ORAL | Status: DC

## 2022-08-05 MED ORDER — CYCLOBENZAPRINE HCL 10 MG PO TABS
10.0000 mg | ORAL_TABLET | Freq: Two times a day (BID) | ORAL | Status: DC | PRN
  Administered 2022-08-07 – 2022-08-10 (×5): 10 mg via ORAL
  Filled 2022-08-05 (×5): qty 1

## 2022-08-05 MED ORDER — PNEUMOCOCCAL 20-VAL CONJ VACC 0.5 ML IM SUSY
0.5000 mL | PREFILLED_SYRINGE | INTRAMUSCULAR | Status: AC
  Administered 2022-08-06: 0.5 mL via INTRAMUSCULAR
  Filled 2022-08-05: qty 0.5

## 2022-08-05 MED ORDER — LORAZEPAM 1 MG PO TABS: 1.0000 mg | ORAL_TABLET | ORAL | Status: DC | PRN

## 2022-08-05 MED ORDER — ALBUTEROL SULFATE (2.5 MG/3ML) 0.083% IN NEBU: 3.0000 mL | INHALATION_SOLUTION | Freq: Four times a day (QID) | RESPIRATORY_TRACT | Status: DC | PRN

## 2022-08-05 MED ORDER — ATORVASTATIN CALCIUM 80 MG PO TABS
80.0000 mg | ORAL_TABLET | Freq: Every day | ORAL | Status: DC
  Administered 2022-08-05 – 2022-08-09 (×5): 80 mg via ORAL
  Filled 2022-08-05 (×7): qty 1

## 2022-08-05 MED ORDER — BUSPIRONE HCL 15 MG PO TABS
15.0000 mg | ORAL_TABLET | Freq: Every day | ORAL | Status: DC
  Administered 2022-08-06 – 2022-08-10 (×5): 15 mg via ORAL
  Filled 2022-08-05 (×8): qty 1

## 2022-08-05 MED ORDER — VITAMIN B-1 100 MG PO TABS
100.0000 mg | ORAL_TABLET | Freq: Every day | ORAL | Status: DC
  Administered 2022-08-06 – 2022-08-10 (×5): 100 mg via ORAL
  Filled 2022-08-05 (×8): qty 1

## 2022-08-05 MED ORDER — FOLIC ACID 1 MG PO TABS
1.0000 mg | ORAL_TABLET | Freq: Every day | ORAL | Status: DC
  Administered 2022-08-06 – 2022-08-10 (×5): 1 mg via ORAL
  Filled 2022-08-05 (×8): qty 1

## 2022-08-05 MED ORDER — THIAMINE HCL 100 MG/ML IJ SOLN: 100.0000 mg | Freq: Every day | INTRAMUSCULAR | Status: DC

## 2022-08-05 MED ORDER — FLUTICASONE PROPIONATE 50 MCG/ACT NA SUSP
1.0000 | Freq: Every day | NASAL | Status: DC
  Administered 2022-08-06 – 2022-08-10 (×5): 1 via NASAL
  Filled 2022-08-05 (×2): qty 16

## 2022-08-05 MED ORDER — DOCUSATE SODIUM 100 MG PO CAPS: 100.0000 mg | ORAL_CAPSULE | Freq: Every day | ORAL | Status: DC | PRN

## 2022-08-05 MED ORDER — HALOPERIDOL LACTATE 5 MG/ML IJ SOLN: 5.0000 mg | Freq: Four times a day (QID) | INTRAMUSCULAR | Status: DC | PRN

## 2022-08-05 MED ORDER — TAMSULOSIN HCL 0.4 MG PO CAPS
0.4000 mg | ORAL_CAPSULE | Freq: Every day | ORAL | Status: DC
  Administered 2022-08-06 – 2022-08-10 (×5): 0.4 mg via ORAL
  Filled 2022-08-05 (×8): qty 1

## 2022-08-05 NOTE — Discharge Summary (Signed)
Physician Discharge Summary  Keith Gibbs:[id number] DOB: 01-18-1961 DOA: 08/03/2022  PCP: Gildardo Pounds, NP  Admit date: 08/03/2022 Discharge date: 08/05/2022  Admitted From: Home Disposition:  Inpt Psych  Recommendations for Outpatient Follow-up:  Follow up with PCP in 1-2 weeks Follow up with Psych as directed  Discharge Condition:Stable   CODE STATUS:Full  Diet recommendation: Low salt low fat diet    Brief/Interim Summary: BAUER AUSBORN is a 62 y.o. male with medical history significant of polysubstance abuse, bipolar disorder, schizoaffective disorder, BPH, COPD, CAD status post LAD stenting, sent for by family member for altered mentation/combative nature.   Patient admitted 03/04/9146 with metabolic encephalopathy, after further history obtained from family it appears patient had been having worsening psychiatric problems from his baseline with increased hallucinations and suicidal ideations -he required multiple sedatives in the ED at intake to maintain patient's safety.  Patient now more awake today admits overtly to intentionally overdosing on his home medications in an attempt for suicide.  Hospitalist called for admission, psych called for consult.  Patient's mental status improved drastically over the past 24 hours, toxic encephalopathy resolved likely secondary to intentional overdose.  Patient evaluated by psych recommending inpatient psychiatric placement at this time.  Patient is medically stable for discharge at this point, awaiting safe disposition location.  Continue patient's medications per psychiatry, medication changes as below.  Discharge Diagnoses:  Principal Problem:   Encephalopathy Active Problems:   Schizoaffective disorder (HCC)   Toxic encephalopathy   Suicidal ideation  Acute toxic encephalopathy, resolved Secondary to intentional overdose/polypharmacy as below -Exacerbated by patient's underlying psychiatric history -UDS positive for  cocaine and benzos -Mental status back to baseline without overt signs of withdrawal -Continue previous home medications as below   Suicidal ideation and attempt via intentional drug overdose -Psychiatry following, appreciate insight recommendations, medically stable for discharge   Bipolar disorder/schizoaffective disorder Continue current regimen including BuSpar, Depakote, Cymbalta Defer to psychiatry for further medication adjustments   CAD with past stenting, stable -Negative troponin and EKG, no further workup indicated -Continue statin, aspirin   Discharge Instructions     Allergies  Allergen Reactions   Latuda [Lurasidone Hcl] Other (See Comments)    Tremors     Saphris [Asenapine] Other (See Comments)    Increased tremors     Consultations: Psychiatry  Procedures/Studies: CT Head Wo Contrast  Result Date: 08/03/2022 CLINICAL DATA:  Altered mental status. EXAM: CT HEAD WITHOUT CONTRAST TECHNIQUE: Contiguous axial images were obtained from the base of the skull through the vertex without intravenous contrast. RADIATION DOSE REDUCTION: This exam was performed according to the departmental dose-optimization program which includes automated exposure control, adjustment of the mA and/or kV according to patient size and/or use of iterative reconstruction technique. COMPARISON:  Head CT September 14, 2021. FINDINGS: Brain: No evidence of acute infarction, hemorrhage, hydrocephalus, extra-axial collection or mass lesion/mass effect. Vascular: No hyperdense vessel or unexpected calcification. Skull: Normal. Negative for fracture or focal lesion. Sinuses/Orbits: Mild mucosal thickening of the ethmoid cells. The orbits are maintained. Other: None. IMPRESSION: No acute intracranial abnormality. Electronically Signed   By: Pedro Earls M.D.   On: 08/03/2022 12:47     Subjective: No acute issues or events overnight denies nausea vomiting diarrhea constipation fever  chills or chest pain   Discharge Exam: Vitals:   08/04/22 2322 08/05/22 0357  BP: (!) 100/52 101/64  Pulse: (!) 54 (!) 46  Resp: 16 16  Temp: 98.5 F (36.9 C) 98.3  F (36.8 C)  SpO2: 97% 98%   Vitals:   08/04/22 2100 08/04/22 2322 08/05/22 0357 08/05/22 0500  BP: 121/66 (!) 100/52 101/64   Pulse:  (!) 54 (!) 46   Resp:  16 16   Temp:  98.5 F (36.9 C) 98.3 F (36.8 C)   TempSrc:  Oral Oral   SpO2:  97% 98%   Weight:    87.2 kg  Height:        General: Pt is alert, awake, not in acute distress Cardiovascular: RRR, S1/S2 +, no rubs, no gallops Respiratory: CTA bilaterally, no wheezing, no rhonchi Abdominal: Soft, NT, ND, bowel sounds + Extremities: no edema, no cyanosis    The results of significant diagnostics from this hospitalization (including imaging, microbiology, ancillary and laboratory) are listed below for reference.     Microbiology: No results found for this or any previous visit (from the past 240 hour(s)).   Labs: BNP (last 3 results) No results for input(s): "BNP" in the last 8760 hours. Basic Metabolic Panel: Recent Labs  Lab 08/03/22 1151 08/04/22 0431 08/05/22 0506  NA 138 140 139  K 3.6 3.8 3.9  CL 104 105 108  CO2 '23 23 24  '$ GLUCOSE 102* 70 114*  BUN '18 20 15  '$ CREATININE 0.84 0.87 0.99  CALCIUM 9.1 8.6* 8.3*   Liver Function Tests: Recent Labs  Lab 08/03/22 1151 08/05/22 0506  AST 27 22  ALT 18 18  ALKPHOS 55 48  BILITOT 1.0 0.4  PROT 6.9 5.4*  ALBUMIN 4.1 3.0*   No results for input(s): "LIPASE", "AMYLASE" in the last 168 hours. No results for input(s): "AMMONIA" in the last 168 hours. CBC: Recent Labs  Lab 08/03/22 0950 08/05/22 0506  WBC 11.5* 7.0  NEUTROABS 9.3*  --   HGB 14.3 12.7*  HCT 41.7 38.4*  MCV 97.0 98.2  PLT 206 175   Cardiac Enzymes: Recent Labs  Lab 08/03/22 0950  CKTOTAL 384   BNP: Invalid input(s): "POCBNP" CBG: Recent Labs  Lab 08/03/22 0938  GLUCAP 102*   D-Dimer No results for  input(s): "DDIMER" in the last 72 hours. Hgb A1c No results for input(s): "HGBA1C" in the last 72 hours. Lipid Profile No results for input(s): "CHOL", "HDL", "LDLCALC", "TRIG", "CHOLHDL", "LDLDIRECT" in the last 72 hours. Thyroid function studies Recent Labs    08/03/22 1616  TSH 0.420   Anemia work up No results for input(s): "VITAMINB12", "FOLATE", "FERRITIN", "TIBC", "IRON", "RETICCTPCT" in the last 72 hours. Urinalysis    Component Value Date/Time   COLORURINE YELLOW 08/03/2022 1003   APPEARANCEUR HAZY (A) 08/03/2022 1003   APPEARANCEUR Clear 05/18/2021 1048   LABSPEC 1.025 08/03/2022 1003   PHURINE 5.0 08/03/2022 1003   GLUCOSEU NEGATIVE 08/03/2022 1003   HGBUR SMALL (A) 08/03/2022 1003   BILIRUBINUR NEGATIVE 08/03/2022 1003   BILIRUBINUR Negative 05/18/2021 1048   KETONESUR 5 (A) 08/03/2022 1003   PROTEINUR NEGATIVE 08/03/2022 1003   UROBILINOGEN 0.2 12/12/2019 0905   NITRITE NEGATIVE 08/03/2022 1003   LEUKOCYTESUR NEGATIVE 08/03/2022 1003   Sepsis Labs Recent Labs  Lab 08/03/22 0950 08/05/22 0506  WBC 11.5* 7.0   Microbiology No results found for this or any previous visit (from the past 240 hour(s)).   Time coordinating discharge: Over 30 minutes  SIGNED:   Little Ishikawa, DO [facility] 08/05/2022, 7:30 AM Pager   If 7PM-7AM, please contact night-coverage [email or web address]

## 2022-08-05 NOTE — Plan of Care (Signed)
   Problem: Coping: Goal: Ability to verbalize frustrations and anger appropriately will improve Outcome: Progressing   Problem: Safety: Goal: Periods of time without injury will increase 08/05/2022 2245 by Irving Shows, RN Outcome: Progressing

## 2022-08-05 NOTE — TOC Progression Note (Addendum)
Transition of Care [facility]) - Progression Note    Patient Details  Name: Keith Gibbs MRN: [id number] Date of Birth: 14-Dec-1960  [facility] of [facility]) CM/SW Contact  Jinger Neighbors, [place] Phone Number: 08/05/2022, 10:16 AM  Clinical Narrative:     Per progression meeting, pt is medically stable as of today to d/c to psych Inpatient. CSW reached back out to [place] at [facility] to ask if pt has been reviewed- no response on either message.  CSW met with pt to see if he's in agreement to go inpatient voluntary. Pt reports he is willing and motivated to go to inpatient for mh tx [voluntary]. Pt further stated he walked in to [facility] on Monday 08/01/2022 to seek help prior to suicide attempt. He reports going inpatient to drug and alcohol rehab 7 months ago- unable to recall name of rehab. Pt inquired about his tremors and asked if the doctor's at [facility] will assist with his tremors. CSW encouraged him to discuss concerns and they may assist with a referral to PCP and/or neurology. Last PCP appointment was 08/03/22, but apt was missed due to current hospitalization. CSW attempted to call the [facility] dispo line 3 times throughout the day and left 1 vm. CSW called the main line at [facility] then transferred to Loren[facility] Courier [facility]). [facility] reports she reviewed the referral and had a Education officer, museum to fax him out to psych hospitals for bed availability, as they do not currently have any beds.  CSW later reviewed a secure chat from [facility] stating they have a bed and can take pt. CSW asked pt to complete voluntary consent form and faxed to [place]. TOC will continue to follow  CSW reviewed [place] Waiver release of liability form with pt and he signed/printed. CSW placed in pt's chart.        Expected Discharge Plan and Services                                               Social Determinants of Health (SDOH) Interventions SDOH Screenings   Alcohol Screen: Low Risk  (06/21/2021)  Depression  (PHQ2-9): High Risk (08/01/2022)  Tobacco Use: High Risk (08/05/2022)    Readmission Risk Interventions    03/01/2022   11:27 AM  Readmission Risk Prevention Plan  Transportation Screening Complete  HRI or Home Care Consult Complete  Social Work Consult for [place] Planning/Counseling Complete  Palliative Care Screening Not Applicable  Medication Review Press photographer) Complete

## 2022-08-05 NOTE — Tx Team (Signed)
Initial Treatment Plan 08/05/2022 6:37 PM Keith Gibbs KIC:[id number]    PATIENT STRESSORS: Health problems   Medication change or noncompliance   Substance abuse     PATIENT STRENGTHS: Communication skills  General fund of knowledge  Religious Affiliation    PATIENT IDENTIFIED PROBLEMS:   Cocaine Abuse    Suicidal Ideation/ Attempt    Auditory/ Visual Hallucinations    Racing Thoughts       DISCHARGE CRITERIA:  Adequate post-discharge living arrangements Safe-care adequate arrangements made Verbal commitment to aftercare and medication compliance  PRELIMINARY DISCHARGE PLAN: Outpatient therapy Return to previous living arrangement  PATIENT/FAMILY INVOLVEMENT: This treatment plan has been presented to and reviewed with the patient, Keith Gibbs. The patient has been given the opportunity to ask questions and make suggestions.  Charlett Lango, RN 08/05/2022, 6:37 PM

## 2022-08-05 NOTE — Progress Notes (Signed)
LCSW Progress Note  [id number]   CHADLEY DZIEDZIC  08/05/2022  12:40 PM  Description:   Inpatient Psychiatric Referral  Patient was recommended inpatient per [name], MD. There are no available beds at [facility]. Patient was referred to the following facilities:   Destination  Service Provider Address Phone Fax  [facility]  [address], [place] [address] [phone]  [place], [place] [address] [phone]  [facility]  [place], [place] [address] [phone]  [facility]  [place], [place] [address] [phone] [address] [place] [address] [phone]  [facility]  [address] [place] [address] [phone] [facility]  [place], [place] [address] [phone]  [facility]  [address] [place], [place] [address] [phone]  [facility]  [address], [place] [address] [phone]  [facility]  [address] [place] [address] [phone]  [facility]  [address], [place] [address] [phone]  [facility]  [address] [place] [address] [phone] [facility]  [place], [place] [address] [phone] [place]  [address], [place] [address] [phone]  [facility]  [address] [place] [address] [phone] [place], [place] [address] [phone] [facility]  [address], [place] [address] [phone]  [facility]  [address], [place] [address] [phone] [address] [place] [address] [phone] [place]  [address] [place] [address] [phone]  [facility]  [address] [place] [address] [phone]      Situation ongoing, CSW to continue following and update chart as more information becomes available.      Denna Haggard, Latanya Presser  08/05/2022 12:40 PM

## 2022-08-05 NOTE — Progress Notes (Signed)
   08/05/22 2200  Psych Admission Type (Psych Patients Only)  Admission Status Voluntary  Psychosocial Assessment  Patient Complaints Anxiety  Eye Contact Fair  Facial Expression Anxious  Affect Anxious  Speech Other (Comment)  Interaction Assertive  Motor Activity Fidgety  Appearance/Hygiene Unremarkable  Behavior Characteristics Cooperative  Mood Anxious  Thought Process  Coherency Other (Comment)  Content Other (Comment)  Delusions None reported or observed  Perception WDL  Hallucination None reported or observed  Judgment Poor  Confusion WDL  Danger to Self  Current suicidal ideation? Denies  Danger to Others  Danger to Others None reported or observed   Alert/oriented. Makes needs/concerns known to staff. Pleasant cooperative with staff. Denies SI/HI/A/V hallucinations. Med compliant. PRN med given with good effect. Patient states went to group. Will encourage  to continue compliance and progression towards goals. Verbally contracted for safety. Will continue to monitor.

## 2022-08-05 NOTE — Progress Notes (Signed)
[facility] Admission Note:  Per ED Note: Keith Gibbs is a 62 y.o. male with medical history significant of polysubstance abuse, bipolar disorder, schizoaffective disorder, BPH, COPD, CAD status post LAD stenting, sent for by family member for altered mentation/combative nature.   Patient admitted 6/57/8469 with metabolic encephalopathy, after further history obtained from family it appears patient had been having worsening psychiatric problems from his baseline with increased hallucinations and suicidal ideations -he required multiple sedatives in the ED at intake to maintain patient's safety.  Patient now more awake today admits overtly to intentionally overdosing on his home medications in an attempt for suicide.   [facility] Admission Note: Pt admitted to [facility] from [facility] on 08/05/22. Pt presents alert and oriented x4 and has remained calm and cooperative throughout admission. Pt reports passive SI on admission and verbally contracts for safety while on the unit. Pt reports AVH stating " I see and hear people watching me, I think this whole world hates me". 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 Pt denies alcohol use. Pt reports cocaine use. Pt's UDS positive for cocaine. Pt reports smoking 1ppd of cigarettes. Pt reports that he is here because he is depressed, anxious, bipolar, and for his paranoid schizophrenia. Pt reports; "I have racing thoughts and I can not concentrate". Pt reports that his primary supporter is his mother. Pt has superficial cuts on the back of his right arm and a scar under his chin. No other notable skin assessment findings. Pt's belongings placed and locked in locker. Pt oriented to unit and provided with dinner tray. Q 15 minute safety checks initiated.

## 2022-08-05 NOTE — Consult Note (Signed)
[facility] Psychiat[facility] Followup Face-to-Face Psychiatric Evaluation   Name: Keith Gibbs DOB: 11/13/1960 MRN: [id number] Service Date: August 05, 2022 LOS:  LOS: 2 days  Reason for Consult: Suicidal ideation Referring Provider: Wynetta Fines, MD   Assessment  Keith Gibbs is a 62 y.o. male admitted medically for 08/03/2022  9:16 AM for He carries the psychiatric diagnoses of schizoaffective disorder- bipolar type and has a past medical history of unstable angina, spondylosis, restless leg syndrome, COPD, and GERD. Psychiatry was consulted for "suicidal ideation" by Wynetta Fines, MD.    His current presentation of depressed mood, poor energy, anhedonia, suicidal attempt, guilt, and auditory hallucinations stating "you are no good" in a distorted male voice that occurs "every now and then" with last occurrence a few days ago, nightmares, flashbacks, hypervigilance, and nonpsychotic paranoia is most consistent with MDD-recurrent/severe/with psychotic features and PTSD, less likely schizoaffective disorder from patient's current reporting as the auditory hallucinations are not consistent and seem to be more mood congruent even though they occur in periods of sobriety (continue to reassess) and visual hallucinations appear primarily in the setting of substance use.  He denies AVH throughout this entire hospitalization.  He meets criteria for inpatient psychiatric hospitalization based on suicide attempt leading to this admission and continued active SI with plan and intent.  See current outpatient psychotropic medications below.  He has not yet had the chance for response to these medications, as his regimen was adjusted on 08/01/2022. He was not fully compliant with medications prior to this appointment as evidenced by self discontinuation of Seroquel. On initial examination, patient appears dejected and voices worthlessness and hopelessness. Please see plan below for detailed recommendations.    Diagnoses:  Active Hospital problems: Principal Problem:   Encephalopathy Active Problems:   Schizoaffective disorder ([place])   Toxic encephalopathy   Suicidal ideation     Plan  ## Safety and Observation Level:  - Based on my clinical evaluation, I estimate the patient to be at extreme risk of self harm in the current setting - At this time, we recommend a continued 1:1 level of observation. This decision is based on my review of the chart including patient's history and current presentation, interview of the patient, mental status examination, and consideration of suicide risk including evaluating suicidal ideation, plan, intent, suicidal or self-harm behaviors, risk factors, and protective factors. This judgment is based on our ability to directly address suicide risk, implement suicide prevention strategies and develop a safety plan while the patient is in the clinical setting. Please contact our team if there is a concern that risk level has changed.   ## Medications:  -- Continue home BuSpar 15 mg daily -Continue home Depakote 500 mg twice daily  -Repeat VPA level on 1/13 -Continue home Cymbalta 60 mg daily - Hold home Zyprexa given overdose attempt -Start nicotine patch 21 mg daily for NRT  ## Medical Decision Making Capacity:  Not formally assessed at this time  ## Further Work-up:  -- Per primary team -- most recent EKG on 08/04/2022 had QtC of 446 -- Pertinent labwork reviewed earlier this admission includes: UDS positive for cocaine and benzodiazepines, VPA 31, WBC 11.5.  Labs otherwise WNL  ## Disposition:  -- Inpatient psychiatric hospitalization; patient medically stable  ## Behavioral / Environmental:  -- Surveyor, quantity  ##Legal Status -Voluntary  Thank you for this consult request. Recommendations have been communicated to the primary team.  We will sign off at this time, as patient to  be transferred to [facility] today.   [name], MD   Followup  history  Relevant Aspects of Hospital Course:  Admitted on 08/03/2022 for altered mental status in the setting of presumed suicide attempt.  Patient Report:  Patient is seen with mom at bedside.  He reports an increased level of anxiety today and is tremulous, but states that this is normal for him.  He continues to endorse active SI with intent to end his life.  As well, he continues to endorse being unhappy with himself and his life.  He otherwise denies HI and AVH, and does not verbalize psychotic paranoia nor delusions.  He is informed that he will be updated on inpatient psychiatric admission, and he is appreciative.  He voices no further acute concerns or complaints today.  Collateral information:  11/12: Today, mom corroborates patient's need for inpatient admission, and verbalizes that she just wants for him to get help.  She says that they are unable to help him to where he needs to be on their own, and she does not know what else to do.  She is appreciative as well of efforts to obtain inpatient psychiatric hospitalization for patient.  Per hospitalist, "At baseline, the patient lives with his mother.  Mother reported patient has had episode of worsening of his baseline psychiatry problems with frequent visual hallucinations, and suicidal ideas in last few weeks for which he was brought into psychiatry clinic 2 days ago.  When his psychiatry medication were adjusted, patient was started on Zyprexa 5 mg at bedtime as well as Depakote 500 mg twice daily.  Mother concerned about the patient restarted multidrug abuse behavior as patient left home yesterday without notifying her.  She also did a pill count while patient is absent at home and found patient probably took 28 of family grams of Zyprexa as only 2 tablets of the present left in the 30-day supply, with whole box intact Depakote at same time. This morning, patient came back with bizarre behavior, when he was in on the floor mumbling and not  responding."   Psychiatric History:  Information collected from patient, chart Diagnoses:schizoaffective disorder, bipolar disorder, depression, polysubstance use (heroin, cocaine, alcohol, marijuana),anxiety, and tobacco dependence.   Current medications:Zoloft 25 mg daily, Zyprexa 5 mg nightly, BuSpar 15 mg twice daily, and Depakote to 500 mg twice daily.  Patient was also prescribed Cymbalta 60 m daily, gabapentin 300 mg 3 times daily, and trazodone 100 mg nightly as needed which he gets from his PCP and pain specialist.  Past medication trials: Seroquel 200 mg   Social History:   Tobacco use: 1 PPD Alcohol use: 2- 40 ounce beers 1 time per week Drug use: Cocaine of varying amounts; most recently used $400 worth in 1 day.  Family History:   The patient's family history includes Depression in his mother; Early death in his father; Heart attack in his father; Heart disease in his father and mother; Hypertension in his father, mother, and sister; Osteoarthritis in his mother.  Medical History: Past Medical History:  Diagnosis Date   Anginal pain ([place])    Anxiety    Arthritis    Bipolar 1 disorder (HCC)    Bursitis    CAD (coronary artery disease)    Chronic pain    COPD (chronic obstructive pulmonary disease) (HCC)    Current use of long term anticoagulation    DAPT (ASA + clopidogrel)   Depression    Diverticulitis    Dyspnea  GERD (gastroesophageal reflux disease)    Grade I diastolic dysfunction    Hepatitis C 2012   No longer has Hep C   HLD (hyperlipidemia)    Hypertension    MI (myocardial infarction) ([place])    Polysubstance abuse (HCC)    cocaine, marijuana, ETOH   PUD (peptic ulcer disease)    S/P angioplasty with stent 06/10/2016   a.) 90% stenosis of pLAD to mLAD - 2.5 x 18 mm Xience Alpine (DES x 1) placed to pLAD   S/P PTCA (percutaneous transluminal coronary angioplasty) 12/04/2019   a.) 60% in stent restenosis of DES to pLAD; LVEF 65%.   Schizophrenia  ([place])    Stroke [facility])    Valvular insufficiency    a.) Mild MR, TR, PR; mild to moderate AR on 03/05/2018 TTE    Surgical History: Past Surgical History:  Procedure Laterality Date   ABDOMINAL SURGERY     removed small piece of intestines due to [facility] Diverticulosis   APPENDECTOMY     BACK SURGERY     CARDIAC CATHETERIZATION Left 06/10/2016   Procedure: Left Heart Cath and Coronary Angiography;  Surgeon: Dionisio David, MD;  Location: [place] CV LAB;  Service: Cardiovascular;  Laterality: Left;   CARDIAC CATHETERIZATION N/A 06/10/2016   Procedure: Coronary Stent Intervention;  Surgeon: Yolonda Kida, MD;  Location: [place] CV LAB;  Service: Cardiovascular;  Laterality: N/A;   CHOLECYSTECTOMY N/A 02/17/2022   Procedure: LAPAROSCOPIC CHOLECYSTECTOMY;  Surgeon: Kieth Brightly Arta Bruce, MD;  Location: [place];  Service: General;  Laterality: N/A;   COLON SURGERY     COLONOSCOPY     COLONOSCOPY WITH PROPOFOL N/A 01/05/2017   Procedure: COLONOSCOPY WITH PROPOFOL;  Surgeon: Jonathon Bellows, MD;  Location: [facility] ENDOSCOPY;  Service: Endoscopy;  Laterality: N/A;   COLONOSCOPY WITH PROPOFOL N/A 02/13/2020   Procedure: COLONOSCOPY WITH PROPOFOL;  Surgeon: Virgel Manifold, MD;  Location: [facility] ENDOSCOPY;  Service: Endoscopy;  Laterality: N/A;   CORONARY ANGIOPLASTY WITH STENT PLACEMENT     ESOPHAGOGASTRODUODENOSCOPY (EGD) WITH PROPOFOL N/A 01/05/2017   Procedure: ESOPHAGOGASTRODUODENOSCOPY (EGD) WITH PROPOFOL;  Surgeon: Jonathon Bellows, MD;  Location: [facility] ENDOSCOPY;  Service: Endoscopy;  Laterality: N/A;   ESOPHAGOGASTRODUODENOSCOPY (EGD) WITH PROPOFOL N/A 02/13/2020   Procedure: ESOPHAGOGASTRODUODENOSCOPY (EGD) WITH PROPOFOL;  Surgeon: Virgel Manifold, MD;  Location: [facility] ENDOSCOPY;  Service: Endoscopy;  Laterality: N/A;   INTRAVASCULAR PRESSURE WIRE/FFR STUDY N/A 12/04/2019   Procedure: INTRAVASCULAR PRESSURE WIRE/FFR STUDY;  Surgeon: Sherren Mocha, MD;  Location: [place] CV  LAB;  Service: Cardiovascular;  Laterality: N/A;   KNEE ARTHROSCOPY WITH MEDIAL MENISECTOMY Right 09/05/2017   Procedure: KNEE ARTHROSCOPY WITH MEDIAL AND LATERAL  MENISECTOMY PARTIAL SYNOVECTOMY;  Surgeon: Hessie Knows, MD;  Location: [facility] ORS;  Service: Orthopedics;  Laterality: Right;   LEFT HEART CATH AND CORONARY ANGIOGRAPHY N/A 12/04/2019   Procedure: LEFT HEART CATH AND CORONARY ANGIOGRAPHY;  Surgeon: Sherren Mocha, MD;  Location: [place] CV LAB;  Service: Cardiovascular;  Laterality: N/A;   SHOULDER SURGERY Right 04/09/2012   SPINE SURGERY     TOTAL KNEE ARTHROPLASTY Right 01/19/2021   Procedure: TOTAL KNEE ARTHROPLASTY - Rachelle Hora to Assist;  Surgeon: Hessie Knows, MD;  Location: [facility] ORS;  Service: Orthopedics;  Laterality: Right;    Medications:   Current Facility-Administered Medications:    albuterol (PROVENTIL) (2.5 MG/3ML) 0.083% nebulizer solution 3 mL, 3 mL, Inhalation, Q6H PRN, Wynetta Fines T, MD   aspirin EC tablet 81 mg, 81 mg, Oral, Daily, [place] Locks, Ping  T, MD, 81 mg at 08/05/22 0818   atorvastatin (LIPITOR) tablet 80 mg, 80 mg, Oral, QHS, Wynetta Fines T, MD, 80 mg at 08/04/22 2147   busPIRone (BUSPAR) tablet 15 mg, 15 mg, Oral, Daily, Wynetta Fines T, MD, 15 mg at 08/05/22 0818   cyclobenzaprine (FLEXERIL) tablet 10 mg, 10 mg, Oral, BID PRN, Wynetta Fines T, MD, 10 mg at 08/04/22 1926   divalproex (DEPAKOTE) DR tablet 500 mg, 500 mg, Oral, BID, Wynetta Fines T, MD, 500 mg at 08/05/22 5427   docusate sodium (COLACE) capsule 100 mg, 100 mg, Oral, Daily PRN, Wynetta Fines T, MD   DULoxetine (CYMBALTA) DR capsule 60 mg, 60 mg, Oral, Daily, [place], Ping T, MD, 60 mg at 08/05/22 0818   enalapril (VASOTEC) tablet 2.5 mg, 2.5 mg, Oral, BID, Wynetta Fines T, MD, 2.5 mg at 08/05/22 0818   enoxaparin (LOVENOX) injection 40 mg, 40 mg, Subcutaneous, Q24H, Wynetta Fines T, MD, 40 mg at 08/04/22 2147   fluticasone (FLONASE) 50 MCG/ACT nasal spray 1 spray, 1 spray, Each Nare, Daily, Wynetta Fines  T, MD, 1 spray at 01/15/75 2831   folic acid (FOLVITE) tablet 1 mg, 1 mg, Oral, Daily, Wynetta Fines T, MD, 1 mg at 08/05/22 0818   haloperidol lactate (HALDOL) injection 5 mg, 5 mg, Intravenous, Q6H PRN, Wynetta Fines T, MD, 5 mg at 08/04/22 1057   ibuprofen (ADVIL) tablet 600 mg, 600 mg, Oral, Q6H PRN, Wynetta Fines T, MD, 600 mg at 08/05/22 1337   [START ON 08/06/2022] influenza vaccine adjuvanted (FLUAD) injection 0.5 mL, 0.5 mL, Intramuscular, Tomorrow-1000, Little Ishikawa, MD   loratadine (CLARITIN) tablet 10 mg, 10 mg, Oral, Daily, [place], Ping T, MD, 10 mg at 08/05/22 0818   LORazepam (ATIVAN) tablet 1-4 mg, 1-4 mg, Oral, Q1H PRN **OR** LORazepam (ATIVAN) injection 1-4 mg, 1-4 mg, Intravenous, Q1H PRN, Wynetta Fines T, MD   meloxicam [facility]) tablet 7.5 mg, 7.5 mg, Oral, Daily, [place], Ping T, MD, 7.5 mg at 08/05/22 0818   multivitamin with minerals tablet 1 tablet, 1 tablet, Oral, Daily, Wynetta Fines T, MD   multivitamin with minerals tablet 1 tablet, 1 tablet, Oral, Daily, Wynetta Fines T, MD, 1 tablet at 08/05/22 0818   nicotine (NICODERM CQ - dosed in mg/24 hours) patch 21 mg, 21 mg, Transdermal, Daily, Elizah Lydon, Loma Sousa, MD, 21 mg at 08/05/22 5176   Oral care mouth rinse, 15 mL, Mouth Rinse, PRN, Opyd, Ilene Qua, MD   pantoprazole (PROTONIX) EC tablet 20 mg, 20 mg, Oral, Daily, [place], Ping T, MD, 20 mg at 08/05/22 0818   rOPINIRole (REQUIP) tablet 2 mg, 2 mg, Oral, QHS, Wynetta Fines T, MD, 2 mg at 08/04/22 2147   tamsulosin (FLOMAX) capsule 0.4 mg, 0.4 mg, Oral, Daily, [place], Ping T, MD, 0.4 mg at 08/05/22 1607   thiamine (VITAMIN B1) tablet 100 mg, 100 mg, Oral, Daily, 100 mg at 08/05/22 3710 **OR** thiamine (VITAMIN B1) injection 100 mg, 100 mg, Intravenous, Daily, Wynetta Fines T, MD, 100 mg at 08/03/22 1509   thiamine (VITAMIN B1) tablet 100 mg, 100 mg, Oral, Daily, Wynetta Fines T, MD, 100 mg at 08/05/22 0818   traZODone (DESYREL) tablet 50 mg, 50 mg, Oral, QHS PRN, Lequita Halt,  MD  Allergies: Allergies  Allergen Reactions   Latuda [Lurasidone Hcl] Other (See Comments)    Tremors     Saphris [Asenapine] Other (See Comments)    Increased tremors        Objective  Vital signs:  Temp:  [97.8 F (36.6 C)-98.5 F (36.9 C)] 97.9 F (36.6 C) (01/12 1211) Pulse Rate:  [46-58] 56 (01/12 1211) Resp:  [16-18] 18 (01/12 1211) BP: (81-122)/(52-78) 116/65 (01/12 1211) SpO2:  [97 %-100 %] 100 % (01/12 1211) Weight:  [87.2 kg] 87.2 kg (01/12 0500)  Psychiatric Specialty Exam:  Presentation  General Appearance:  Appropriate for Environment; Casual  Eye Contact: Good  Speech: Clear and Coherent; Normal Rate  Speech Volume: Decreased  Handedness: Right   Mood and Affect  Mood: Depressed; Dysphoric  Affect: Congruent; Depressed   Thought Process  Thought Processes: Coherent; Goal Directed; Linear  Descriptions of Associations:Intact  Orientation:Full (Time, Place and Person)  Thought Content:Logical  History of Schizophrenia/Schizoaffective disorder:No data recorded Duration of Psychotic Symptoms:No data recorded Hallucinations:Hallucinations: None  Ideas of Reference:None  Suicidal Thoughts:Suicidal Thoughts: Yes, Active SI Active Intent and/or Plan: With Intent; With Plan; With Means to Carry Out; With Access to Means  Homicidal Thoughts:Homicidal Thoughts: No   Sensorium  Memory: Immediate Good; Recent Fair  Judgment: Poor  Insight: Fair   Materials engineer: Fair  Attention Span: Fair  Recall: [employer] of Knowledge: Fair  Language: Fair   Psychomotor Activity  Psychomotor Activity:Psychomotor Activity: Increased; tremulous   Assets  Assets: Armed forces logistics/support/administrative officer; Desire for Improvement; Housing; Social Support   Sleep  Sleep:Sleep: Fair    Physical Exam: Physical Exam Vitals reviewed.  Constitutional:      General: He is not in acute distress.    Appearance: He is  ill-appearing.     Comments: chronically  HENT:     Head: Normocephalic and atraumatic.     Mouth/Throat:     Mouth: Mucous membranes are moist.     Pharynx: Oropharynx is clear.  Cardiovascular:     Rate and Rhythm: Bradycardia present.  Pulmonary:     Effort: Pulmonary effort is normal.  Skin:    General: Skin is warm and dry.  Neurological:     General: No focal deficit present.     Mental Status: He is alert and oriented to person, place, and time.     Comments: Tremulousness of bilateral hands    Review of Systems  Neurological:  Positive for tremors.  Psychiatric/Behavioral:  Positive for depression, substance abuse and suicidal ideas. Negative for hallucinations. The patient does not have insomnia.    Blood pressure 116/65, pulse (!) 56, temperature 97.9 F (36.6 C), temperature source Oral, resp. rate 18, height '6\' 3"'$  (1.905 m), weight 87.2 kg, SpO2 100 %. Body mass index is 24.03 kg/m.

## 2022-08-06 DIAGNOSIS — T50902A Poisoning by unspecified drugs, medicaments and biological substances, intentional self-harm, initial encounter: Secondary | ICD-10-CM | POA: Insufficient documentation

## 2022-08-06 DIAGNOSIS — F1994 Other psychoactive substance use, unspecified with psychoactive substance-induced mood disorder: Principal | ICD-10-CM

## 2022-08-06 MED ORDER — TRAZODONE HCL 100 MG PO TABS
100.0000 mg | ORAL_TABLET | Freq: Every day | ORAL | Status: DC
  Administered 2022-08-06 – 2022-08-09 (×4): 100 mg via ORAL
  Filled 2022-08-06 (×6): qty 1

## 2022-08-06 MED ORDER — ZIPRASIDONE MESYLATE 20 MG IM SOLR: 20.0000 mg | INTRAMUSCULAR | Status: DC | PRN

## 2022-08-06 MED ORDER — DULOXETINE HCL 30 MG PO CPEP
90.0000 mg | ORAL_CAPSULE | Freq: Every day | ORAL | Status: DC
  Administered 2022-08-07 – 2022-08-10 (×4): 90 mg via ORAL
  Filled 2022-08-06 (×7): qty 3

## 2022-08-06 MED ORDER — HYDROXYZINE HCL 25 MG PO TABS
25.0000 mg | ORAL_TABLET | Freq: Three times a day (TID) | ORAL | Status: DC | PRN
  Administered 2022-08-08: 25 mg via ORAL
  Filled 2022-08-06: qty 1

## 2022-08-06 MED ORDER — OLANZAPINE 5 MG PO TBDP: 5.0000 mg | ORAL_TABLET | Freq: Three times a day (TID) | ORAL | Status: DC | PRN

## 2022-08-06 MED ORDER — LORAZEPAM 1 MG PO TABS: 1.0000 mg | ORAL_TABLET | ORAL | Status: DC | PRN

## 2022-08-06 MED ORDER — GABAPENTIN 100 MG PO CAPS
100.0000 mg | ORAL_CAPSULE | Freq: Three times a day (TID) | ORAL | Status: AC
  Administered 2022-08-06 – 2022-08-07 (×3): 100 mg via ORAL
  Filled 2022-08-06 (×6): qty 1

## 2022-08-06 NOTE — Progress Notes (Signed)
D: Patient is alert, oriented, pleasant, and cooperative. Denies SI, HI, AVH, and verbally contracts for safety. Patient reports he slept fair last night with sleeping medication. Patient reports his appetite as good, energy level as low, and concentration as good. Patient rates his depression 7/10, hopelessness 7/10, and anxiety 5/10. Patient shaking and cigarette cravings. Patient reports low back, left leg, and right shoulder pain that ibuprofen isn't helping.   A: Scheduled medications administered per MD order. Support provided. Patient educated on safety on the unit and medications. Routine safety checks every 15 minutes. Patient stated understanding to tell nurse about any new physical symptoms. Patient understands to tell staff of any needs.     R: No adverse drug reactions noted. Patient verbally contracts for safety. Patient remains safe at this time and will continue to monitor.    08/06/22 0900  Psych Admission Type (Psych Patients Only)  Admission Status Voluntary  Psychosocial Assessment  Patient Complaints Anxiety  Eye Contact Fair  Facial Expression Anxious  Affect Anxious  Speech Logical/coherent  Interaction Assertive  Motor Activity Fidgety  Appearance/Hygiene Unremarkable  Behavior Characteristics Cooperative  Mood Anxious;Pleasant  Thought Process  Coherency WDL  Content WDL  Delusions None reported or observed  Perception WDL  Hallucination None reported or observed  Judgment Limited  Confusion None  Danger to Self  Current suicidal ideation? Denies  Agreement Not to Harm Self Yes  Description of Agreement verbal  Danger to Others  Danger to Others None reported or observed

## 2022-08-06 NOTE — H&P (Signed)
Psychiatric Admission Assessment Adult  Patient Identification: Keith Gibbs MRN:  [id number] Date of Evaluation:  08/06/2022 Chief Complaint:  Schizoaffective DO Principal Diagnosis: Substance induced mood disorder ([place]) Diagnosis:  Principal Problem:   Substance induced mood disorder (HCC) Active Problems:   Chronic pain syndrome   Cocaine use   Angina at rest   CAD S/P percutaneous coronary angioplasty   Suicide attempt by drug overdose ([place])  History of Present Illness:  Keith Gibbs is a 63 year old male with a history of numerous substance use disorders (opioids, cocaine, alcohol), reported schizoaffective disorder-bipolar type, familiar to this provider from an October stay in the [facility]. He has a medical history of CAD w unstable angina, s/p PCI, and chronic pain. He was medically admitted for several days after an overdose on home Zyprexa. He was admitted voluntarily to inpatient psychiatry. After initial evaluation, feel that the patient's substance use is predominant in his psychiatric symptoms. Diagnosis is substance induced mood disorder in context of frequent cocaine use. Feel his mood will improve greatly with abstinence from drugs.  Patient also appears to have intermittent explosive disorder, currently managed with Depakote.  Feel strongly that the patient does not have schizoaffective disorder-bipolar type.  Patient describes previous "manic" episodes as panic attacks, and given his near constant substance use we cannot diagnose him with a manic episode.  Patient desires outpatient follow-up, so that he can attend his surgery on the 26th of this month.  On interview today, the patient reports that he left the [facility] facility based crisis in October and went to [facility] residential rehab.  He reports leaving after a few days, stating that his chronic pain was severe and he was unable to participate well.  He reports using copious amounts of drugs and attempt to deal with the pain.   He reports that his mother became frustrated with him making statements such as "I cannot stand you" and "you are worthless".  He reports that his mother is his payee for his disability check.  That he can return back to her home.  The patient reports increasing depression with all this turmoil and states that he attempted to take his life by taking the overdose of Zyprexa.  He reports that he has particular difficulty with his anger and says that he will destroy property.  He reports to the attending psychiatrist that he is in jail for violence in the past.  He reports attempting suicide several times previously, by cutting and by hanging himself.  Patient presently denies suicidal thoughts or any sort of withdrawal.  He states that he wants to participate well in his treatment and at the end of the interview says "I do not want to miss classes" before returning to group.  Called the patient's mother, Keith Gibbs, with the patient's consent at [phone].  Discussed the situation and the treatment plan in detail.  Questions answered.    Total Time spent with patient: 45 minutes  Past Psychiatric History: as above  Is the patient at risk to self? yes Has the patient been a risk to self in the past 6 months? Yes.    Has the patient been a risk to self within the distant past? Yes.    Is the patient a risk to others? No.  Has the patient been a risk to others in the past 6 months? No.  Has the patient been a risk to others within the distant past? No.   [place] Scale:  [employer]  Admission (Current) from 08/05/2022 in [facility] ED to Hosp-Admission (Discharged) from 08/03/2022 in [facility] Video Visit from 08/01/2022 in [place] [facility] Risk Error: Q3, 4, or 5 should not be populated when Q2 is No Error: Q7 should not be populated when Q6 is No        Prior Inpatient Therapy: Yes.    Prior Outpatient Therapy: No.    Alcohol Screening: 1. How often do you have a drink containing alcohol?: Never 2. How many drinks containing alcohol do you have on a typical day when you are drinking?: 1 or 2 3. How often do you have six or more drinks on one occasion?: Never AUDIT-C Score: 0 4. How often during the last year have you found that you were not able to stop drinking once you had started?: Never 5. How often during the last year have you failed to do what was normally expected from you because of drinking?: Never 6. How often during the last year have you needed a first drink in the morning to get yourself going after a heavy drinking session?: Never 7. How often during the last year have you had a feeling of guilt of remorse after drinking?: Never 8. How often during the last year have you been unable to remember what happened the night before because you had been drinking?: Never 9. Have you or someone else been injured as a result of your drinking?: No 10. Has a relative or friend or a doctor or another health worker been concerned about your drinking or suggested you cut down?: No Alcohol Use Disorder Identification Test Final Score (AUDIT): 0 Substance Abuse History in the last 12 months:  Yes.   Consequences of Substance Abuse: Negative Previous Psychotropic Medications: Yes  Psychological Evaluations: Yes  Past Medical History:  Past Medical History:  Diagnosis Date   Anginal pain ([place])    Anxiety    Arthritis    Bipolar 1 disorder (HCC)    Bursitis    CAD (coronary artery disease)    Chronic pain    COPD (chronic obstructive pulmonary disease) (HCC)    Current use of long term anticoagulation    DAPT (ASA + clopidogrel)   Depression    Diverticulitis    Dyspnea    GERD (gastroesophageal reflux disease)    Grade I diastolic dysfunction    Hepatitis C 2012   No longer has Hep C   HLD (hyperlipidemia)    Hypertension    MI (myocardial infarction) ([place])     Polysubstance abuse (HCC)    cocaine, marijuana, ETOH   PUD (peptic ulcer disease)    S/P angioplasty with stent 06/10/2016   a.) 90% stenosis of pLAD to mLAD - 2.5 x 18 mm Xience Alpine (DES x 1) placed to pLAD   S/P PTCA (percutaneous transluminal coronary angioplasty) 12/04/2019   a.) 60% in stent restenosis of DES to pLAD; LVEF 65%.   Schizophrenia ([place])    Stroke [facility])    Valvular insufficiency    a.) Mild MR, TR, PR; mild to moderate AR on 03/05/2018 TTE    Past Surgical History:  Procedure Laterality Date   ABDOMINAL SURGERY     removed small piece of intestines due to [facility] Diverticulosis   APPENDECTOMY     BACK SURGERY     CARDIAC CATHETERIZATION Left 06/10/2016   Procedure: Left Heart Cath and Coronary Angiography;  Surgeon:  Dionisio David, MD;  Location: [place] [facility];  Service: Cardiovascular;  Laterality: Left;   CARDIAC CATHETERIZATION N/A 06/10/2016   Procedure: Coronary Stent Intervention;  Surgeon: Yolonda Kida, MD;  Location: [place] [facility];  Service: Cardiovascular;  Laterality: N/A;   CHOLECYSTECTOMY N/A 02/17/2022   Procedure: LAPAROSCOPIC CHOLECYSTECTOMY;  Surgeon: Kieth Brightly Arta Bruce, MD;  Location: [place];  Service: General;  Laterality: N/A;   COLON SURGERY     COLONOSCOPY     COLONOSCOPY WITH PROPOFOL N/A 01/05/2017   Procedure: COLONOSCOPY WITH PROPOFOL;  Surgeon: Jonathon Bellows, MD;  Location: [facility] ENDOSCOPY;  Service: Endoscopy;  Laterality: N/A;   COLONOSCOPY WITH PROPOFOL N/A 02/13/2020   Procedure: COLONOSCOPY WITH PROPOFOL;  Surgeon: Virgel Manifold, MD;  Location: [facility] ENDOSCOPY;  Service: Endoscopy;  Laterality: N/A;   CORONARY ANGIOPLASTY WITH STENT PLACEMENT     ESOPHAGOGASTRODUODENOSCOPY (EGD) WITH PROPOFOL N/A 01/05/2017   Procedure: ESOPHAGOGASTRODUODENOSCOPY (EGD) WITH PROPOFOL;  Surgeon: Jonathon Bellows, MD;  Location: [facility] ENDOSCOPY;  Service: Endoscopy;  Laterality: N/A;   ESOPHAGOGASTRODUODENOSCOPY (EGD) WITH PROPOFOL  N/A 02/13/2020   Procedure: ESOPHAGOGASTRODUODENOSCOPY (EGD) WITH PROPOFOL;  Surgeon: Virgel Manifold, MD;  Location: [facility] ENDOSCOPY;  Service: Endoscopy;  Laterality: N/A;   INTRAVASCULAR PRESSURE WIRE/FFR STUDY N/A 12/04/2019   Procedure: INTRAVASCULAR PRESSURE WIRE/FFR STUDY;  Surgeon: Sherren Mocha, MD;  Location: [place] [facility];  Service: Cardiovascular;  Laterality: N/A;   KNEE ARTHROSCOPY WITH MEDIAL MENISECTOMY Right 09/05/2017   Procedure: KNEE ARTHROSCOPY WITH MEDIAL AND LATERAL  MENISECTOMY PARTIAL SYNOVECTOMY;  Surgeon: Hessie Knows, MD;  Location: [facility] ORS;  Service: Orthopedics;  Laterality: Right;   LEFT HEART CATH AND CORONARY ANGIOGRAPHY N/A 12/04/2019   Procedure: LEFT HEART CATH AND CORONARY ANGIOGRAPHY;  Surgeon: Sherren Mocha, MD;  Location: [place] [facility];  Service: Cardiovascular;  Laterality: N/A;   SHOULDER SURGERY Right 04/09/2012   SPINE SURGERY     TOTAL KNEE ARTHROPLASTY Right 01/19/2021   Procedure: TOTAL KNEE ARTHROPLASTY - Rachelle Hora to Assist;  Surgeon: Hessie Knows, MD;  Location: [facility] ORS;  Service: Orthopedics;  Laterality: Right;   Family History:  Family History  Problem Relation Age of Onset   Osteoarthritis Mother    Heart disease Mother    Hypertension Mother    Depression Mother    Heart disease Father    Early death Father    Hypertension Father    Heart attack Father    Hypertension Sister    Prostate cancer Neg Hx    Bladder Cancer Neg Hx    Kidney cancer Neg Hx    Family Psychiatric  History: none Tobacco Screening:  Social History   Tobacco Use  Smoking Status Some Days   Packs/day: 0.50   Years: 32.00   Total pack years: 16.00   Types: Cigarettes   Last attempt to quit: 10/25/2021   Years since quitting: 0.7  Smokeless Tobacco Never  Tobacco Comments   Continues to smoke approximately 5 cigarettes per day.  Has been 2 days since las cigarette.  Has not bought any more.  Using patches and gum.  5/23 hfb     BH Tobacco Counseling     Are you interested in Tobacco Cessation Medications?  No, patient refused Counseled patient on smoking cessation:  Refused/Declined practical counseling Reason Tobacco Screening Not Completed: Patient Refused Screening       Social History:  Social History   Substance and Sexual Activity  Alcohol Use Yes   Alcohol/week: 6.0 standard  drinks of alcohol   Types: 6 Cans of beer per week   Comment: occassionally      Social History   Substance and Sexual Activity  Drug Use Not Currently   Types: Cocaine, Marijuana   Comment: last on saturday    Additional Social History:                           Allergies:   Allergies  Allergen Reactions   Latuda [Lurasidone Hcl] Other (See Comments)    Tremors     Saphris [Asenapine] Other (See Comments)    Increased tremors    Lab Results:  Results for orders placed or performed during the hospital encounter of 08/03/22 (from the past 48 hour(s))  CBC     Status: Abnormal   Collection Time: 08/05/22  5:06 AM  Result Value Ref Range   WBC 7.0 4.0 - 10.5 K/uL   RBC 3.91 (L) 4.22 - 5.81 MIL/uL   Hemoglobin 12.7 (L) 13.0 - 17.0 g/dL   HCT 38.4 (L) 39.0 - 52.0 %   MCV 98.2 80.0 - 100.0 fL   MCH 32.5 26.0 - 34.0 pg   MCHC 33.1 30.0 - 36.0 g/dL   RDW 14.1 11.5 - 15.5 %   Platelets 175 150 - 400 K/uL   nRBC 0.0 0.0 - 0.2 %    Comment: Performed at [facility], [place] [address], [place], [place] [address]  Comprehensive metabolic panel     Status: Abnormal   Collection Time: 08/05/22  5:06 AM  Result Value Ref Range   Sodium 139 135 - 145 mmol/L   Potassium 3.9 3.5 - 5.1 mmol/L   Chloride 108 98 - 111 mmol/L   CO2 24 22 - 32 mmol/L   Glucose, Bld 114 (H) 70 - 99 mg/dL    Comment: Glucose reference range applies only to samples taken after fasting for at least 8 hours.   BUN 15 8 - 23 mg/dL   Creatinine, Ser 0.99 0.61 - 1.24 mg/dL   Calcium 8.3 (L) 8.9 - 10.3 mg/dL   Total Protein 5.4  (L) 6.5 - 8.1 g/dL   Albumin 3.0 (L) 3.5 - 5.0 g/dL   AST 22 15 - 41 U/L   ALT 18 0 - 44 U/L   Alkaline Phosphatase 48 38 - 126 U/L   Total Bilirubin 0.4 0.3 - 1.2 mg/dL   GFR, Estimated >60 >60 mL/min    Comment: (NOTE) Calculated using the CKD-EPI Creatinine Equation (2021)    Anion gap 7 5 - 15    Comment: Performed at [facility], [place] [address] [place], [place] [address]  Resp panel by RT-PCR (RSV, Flu A&B, Covid) Anterior Nasal Swab     Status: None   Collection Time: 08/05/22 10:17 AM   Specimen: Anterior Nasal Swab  Result Value Ref Range   SARS Coronavirus 2 by RT PCR NEGATIVE NEGATIVE    Comment: (NOTE) SARS-CoV-2 target nucleic acids are NOT DETECTED.  The SARS-CoV-2 RNA is generally detectable in upper respiratory specimens during the acute phase of infection. The lowest concentration of SARS-CoV-2 viral copies this assay can detect is 138 copies/mL. A negative result does not preclude SARS-Cov-2 infection and should not be used as the sole basis for treatment or other patient management decisions. A negative result may occur with  improper specimen collection/handling, submission of specimen other than nasopharyngeal swab, presence of viral mutation(s) within the areas  targeted by this assay, and inadequate number of viral copies(<138 copies/mL). A negative result must be combined with clinical observations, patient history, and epidemiological information. The expected result is Negative.  Fact Sheet for Patients:  EntrepreneurPulse.com.au  Fact Sheet for Healthcare Providers:  IncredibleEmployment.be  This test is no t yet approved or cleared by the [place] FDA and  has been authorized for detection and/or diagnosis of SARS-CoV-2 by FDA under an Emergency Use Authorization (EUA). This EUA will remain  in effect (meaning this test can be used) for the duration of the COVID-19 declaration under Section 564(b)(1)  of the Act, 21 U.S.C.section 360bbb-3(b)(1), unless the authorization is terminated  or revoked sooner.       Influenza A by PCR NEGATIVE NEGATIVE   Influenza B by PCR NEGATIVE NEGATIVE    Comment: (NOTE) The Xpert Xpress SARS-CoV-2/FLU/RSV plus assay is intended as an aid in the diagnosis of influenza from Nasopharyngeal swab specimens and should not be used as a sole basis for treatment. Nasal washings and aspirates are unacceptable for Xpert Xpress SARS-CoV-2/FLU/RSV testing.  Fact Sheet for Patients: EntrepreneurPulse.com.au  Fact Sheet for Healthcare Providers: IncredibleEmployment.be  This test is not yet approved or cleared by the [place] FDA and has been authorized for detection and/or diagnosis of SARS-CoV-2 by FDA under an Emergency Use Authorization (EUA). This EUA will remain in effect (meaning this test can be used) for the duration of the COVID-19 declaration under Section 564(b)(1) of the Act, 21 U.S.C. section 360bbb-3(b)(1), unless the authorization is terminated or revoked.     Resp Syncytial Virus by PCR NEGATIVE NEGATIVE    Comment: (NOTE) Fact Sheet for Patients: EntrepreneurPulse.com.au  Fact Sheet for Healthcare Providers: IncredibleEmployment.be  This test is not yet approved or cleared by the [place] FDA and has been authorized for detection and/or diagnosis of SARS-CoV-2 by FDA under an Emergency Use Authorization (EUA). This EUA will remain in effect (meaning this test can be used) for the duration of the COVID-19 declaration under Section 564(b)(1) of the Act, 21 U.S.C. section 360bbb-3(b)(1), unless the authorization is terminated or revoked.  Performed at [facility], [place] [address], [place],  [address]     Blood Alcohol level:  Lab Results  Component Value Date   [facility] <10 08/03/2022   ETH <10 28/78/6767    Metabolic Disorder Labs:   Lab Results  Component Value Date   HGBA1C 5.2 04/26/2022   MPG 102.54 04/26/2022   MPG 103 12/28/2016   No results found for: "PROLACTIN" Lab Results  Component Value Date   CHOL 83 04/26/2022   TRIG 52 04/26/2022   HDL 28 (L) 04/26/2022   CHOLHDL 3.0 04/26/2022   VLDL 10 04/26/2022   LDLCALC 45 04/26/2022   LDLCALC 41 08/31/2021    Current Medications: Current Facility-Administered Medications  Medication Dose Route Frequency Provider Last Rate Last Admin   albuterol (PROVENTIL) (2.5 MG/3ML) 0.083% nebulizer solution 3 mL  3 mL Inhalation Q6H PRN [name], MD       aspirin EC tablet 81 mg  81 mg Oral Daily [name], MD   81 mg at 08/06/22 0748   atorvastatin (LIPITOR) tablet 80 mg  80 mg Oral QHS [name], MD   80 mg at 08/05/22 2144   busPIRone (BUSPAR) tablet 15 mg  15 mg Oral Daily [name], MD   15 mg at 08/06/22 0749   cyclobenzaprine (FLEXERIL) tablet 10 mg  10 mg Oral BID PRN Cosby,  Loma Sousa, MD       divalproex (DEPAKOTE) DR tablet 500 mg  500 mg Oral BID [name], MD   500 mg at 08/06/22 0748   docusate sodium (COLACE) capsule 100 mg  100 mg Oral Daily PRN [name], MD       DULoxetine (CYMBALTA) DR capsule 60 mg  60 mg Oral Daily [name], MD   60 mg at 08/06/22 0748   enalapril (VASOTEC) tablet 2.5 mg  2.5 mg Oral BID [name], MD   2.5 mg at 08/06/22 0750   fluticasone (FLONASE) 50 MCG/ACT nasal spray 1 spray  1 spray Each Nare Daily [name], MD   1 spray at 81/44/81 8563   folic acid (FOLVITE) tablet 1 mg  1 mg Oral Daily [name], MD   1 mg at 08/06/22 0748   haloperidol lactate (HALDOL) injection 5 mg  5 mg Intravenous Q6H PRN [name], MD       ibuprofen (ADVIL) tablet 600 mg  600 mg Oral Q6H PRN [name], MD   600 mg at 08/06/22 0752   loratadine (CLARITIN) tablet 10 mg  10 mg Oral Daily [name], MD   10 mg at 08/06/22 0750   LORazepam (ATIVAN) tablet 1-4 mg  1-4 mg  Oral Q1H PRN [name], MD       Or   LORazepam (ATIVAN) injection 1-4 mg  1-4 mg Intravenous Q1H PRN [name], MD       meloxicam (MOBIC) tablet 7.5 mg  7.5 mg Oral Daily [name], MD   7.5 mg at 08/06/22 0751   multivitamin with minerals tablet 1 tablet  1 tablet Oral Daily [name], MD   1 tablet at 08/06/22 0749   nicotine (NICODERM CQ - dosed in mg/24 hours) patch 21 mg  21 mg Transdermal Daily [name], MD   21 mg at 08/06/22 0751   Oral care mouth rinse  15 mL Mouth Rinse PRN [name], MD       pantoprazole (PROTONIX) EC tablet 20 mg  20 mg Oral Daily [name], MD   20 mg at 08/06/22 0748   rOPINIRole (REQUIP) tablet 2 mg  2 mg Oral QHS [name], MD   2 mg at 08/05/22 2143   tamsulosin (FLOMAX) capsule 0.4 mg  0.4 mg Oral Daily [name], MD   0.4 mg at 08/06/22 0750   thiamine (Vitamin B-1) tablet 100 mg  100 mg Oral Daily [name], MD   100 mg at 08/06/22 0751   traZODone (DESYREL) tablet 50 mg  50 mg Oral QHS PRN [name], MD       PTA Medications: Medications Prior to Admission  Medication Sig Dispense Refill Last Dose   albuterol (VENTOLIN HFA) 108 (90 Base) MCG/ACT inhaler Inhale 2 puffs into the lungs every 6 (six) hours as needed for wheezing or shortness of breath. 18 g 6    aspirin EC 81 MG tablet Take 1 tablet (81 mg total) by mouth daily. Swallow whole. 30 tablet 12    atorvastatin (LIPITOR) 80 MG tablet Take 1 tablet (80 mg total) by mouth at bedtime. 30 tablet 0    Budeson-Glycopyrrol-Formoterol (BREZTRI AEROSPHERE) 160-9-4.8 MCG/ACT AERO Inhale 2 puffs into the lungs in the morning and at bedtime.      busPIRone (BUSPAR) 15 MG tablet Take 1 tablet (15 mg total) by mouth daily. 60 tablet 3    cyclobenzaprine (FLEXERIL) 10 MG tablet Take 1 tablet (10  mg total) by mouth 2 (two) times daily as needed for muscle spasms. 20 tablet 0    diclofenac Sodium (VOLTAREN) 1 % GEL Apply 2 g topically 4 (four)  times daily. 2 g 0    divalproex (DEPAKOTE) 500 MG DR tablet Take 1 tablet (500 mg total) by mouth 2 (two) times daily. 60 tablet 3    docusate sodium (COLACE) 100 MG capsule Take 1 capsule (100 mg total) by mouth daily as needed for mild constipation. (Patient not taking: Reported on 08/03/2022) 10 capsule 0    DULoxetine (CYMBALTA) 60 MG capsule Take 1 capsule (60 mg total) by mouth daily. (Patient not taking: Reported on 08/03/2022) 30 capsule 0    enalapril (VASOTEC) 2.5 MG tablet Take 1 tablet (2.5 mg total) by mouth 2 (two) times daily. (Patient not taking: Reported on 08/03/2022) 60 tablet 0    fluticasone (FLONASE) 50 MCG/ACT nasal spray Place 1 spray into both nostrils at bedtime. (Patient not taking: Reported on 08/03/2022) 9.9 mL 0    gabapentin (NEURONTIN) 300 MG capsule Take 300 mg by mouth 3 (three) times daily.      loratadine (CLARITIN) 10 MG tablet Take 1 tablet (10 mg total) by mouth daily. 30 tablet 0    Multiple Vitamin (MULTIVITAMIN WITH MINERALS) TABS tablet Take 1 tablet by mouth daily. 30 tablet 0    nitroGLYCERIN (NITROSTAT) 0.3 MG SL tablet Place 1 tablet (0.3 mg total) under the tongue every 5 (five) minutes as needed for chest pain. Maximum of 3 doses daily. 90 tablet 0    pantoprazole (PROTONIX) 20 MG tablet Take 1 tablet (20 mg total) by mouth daily for 15 days. (Patient not taking: Reported on 08/03/2022) 15 tablet 0    rOPINIRole (REQUIP) 2 MG tablet Take 1 tablet (2 mg total) by mouth at bedtime. TAKE 1 TABLET BY MOUTH EVERYDAY AT BEDTIME Strength: 2 mg (Patient taking differently: Take 2 mg by mouth at bedtime.) 30 tablet 0    tamsulosin (FLOMAX) 0.4 MG CAPS capsule Take 1 capsule (0.4 mg total) by mouth daily. 30 capsule 0    thiamine (VITAMIN B-1) 100 MG tablet Take 1 tablet (100 mg total) by mouth daily. (Patient not taking: Reported on 08/03/2022) 30 tablet 0     Musculoskeletal: Strength & Muscle Tone: within normal limits Gait & Station: normal Patient leans:  N/A   Psychiatric Specialty Exam: Physical Exam Constitutional:      Appearance: the patient is not toxic-appearing.  Pulmonary:     Effort: Pulmonary effort is normal.  Neurological:     General: No focal deficit present.     Mental Status: the patient is alert and oriented to person, place, and time.   Review of Systems  Respiratory:  Negative for shortness of breath.   Cardiovascular:  Negative for chest pain.  Gastrointestinal:  Negative for abdominal pain, constipation, diarrhea, nausea and vomiting.  Neurological:  Negative for headaches.      BP 104/72 (BP Location: Right Arm)   Pulse (!) 59   Temp 98.2 F (36.8 C) (Oral)   Resp 20   Ht '6\' 3"'$  (1.905 m)   Wt 89.8 kg   SpO2 98%   BMI 24.75 kg/m   General Appearance: Fairly Groomed  Eye Contact:  Good  Speech:  Clear and Coherent  Volume:  Normal  Mood:  depressed, anxious  Affect:  Congruent  Thought Process:  Coherent  Orientation:  Full (Time, Place, and Person)  Thought Content: Logical  Suicidal Thoughts:  No  Homicidal Thoughts:  No  Memory:  Immediate;   Good  Judgement:  fair  Insight:  fair  Psychomotor Activity:  Normal  Concentration:  Concentration: Good  Recall:  Good  Fund of Knowledge: Good  Language: Good  Akathisia:  No  Handed:    AIMS (if indicated): not done  Assets:  Communication Skills Desire for Improvement Financial Resources/Insurance Housing Leisure Time Physical Health  ADL's:  Intact  Cognition: WNL  Sleep:  Fair     Treatment Plan Summary: Daily contact with patient to assess and evaluate symptoms and progress in treatment and Medication management   Physician Treatment Plan for Primary Diagnosis: Substance induced mood disorder ([place]) Long Term Goal(s): Improvement in symptoms so as ready for discharge  Short Term Goals: Ability to disclose and discuss suicidal ideas  Physician Treatment Plan for Secondary Diagnosis: Principal Problem:   Substance induced  mood disorder ([place]) Active Problems:   Chronic pain syndrome   Cocaine use   Angina at rest   CAD S/P percutaneous coronary angioplasty   Suicide attempt by drug overdose ([place])  Long Term Goal(s): Improvement in symptoms so as ready for discharge  Short Term Goals: Ability to identify and develop effective coping behaviors will improve   ASSESSMENT:  Diagnoses / Active Problems: Substance-induced mood disorder Cocaine use disorder Intermittent explosive disorder  PLAN: Safety and Monitoring:  -- Voluntary admission to inpatient psychiatric unit for safety, stabilization and treatment  -- Daily contact with patient to assess and evaluate symptoms and progress in treatment  -- Patient's case to be discussed in multi-disciplinary team meeting  -- Observation Level : q15 minute checks  -- Vital signs:  q12 hours  -- Precautions: suicide, elopement, and assault  2. Psychiatric Diagnoses and Treatment:  -Increase home Cymbalta from 60 mg to 90 mg, to help with depression and pain -Continue Depakote at 500 mg twice daily (dose increased at medical hospital) - VPA level 1/16, ordered as trough level - Do not restart home Zyprexa given overdose - Replace with trazodone 100 mg nightly for sleep - Will reassess role of Requip --  The risks/benefits/side-effects/alternatives to this medication were discussed in detail with the patient and time was given for questions. The patient consents to medication trial.  -- Encouraged patient to participate in unit milieu and in scheduled group therapies   -- Short Term Goals: Ability to verbalize feelings will improve  -- Long Term Goals: Improvement in symptoms so as ready for discharge    3. Medical Issues Being Addressed:   Tobacco Use Disorder  -- Nicotine patch '21mg'$ /24 hours ordered  -- Smoking cessation encouraged - Gabapentin 100 mg 3 times daily -> 300 mg 3 times daily for chronic pain - Patient denies opioid abuse, UDS negative for  opioids  4. Discharge Planning:   -- Social work and case management to assist with discharge planning and identification of hospital follow-up needs prior to discharge  -- Estimated LOS: 5-7 days  -- Discharge Concerns: Need to establish a safety plan; Medication compliance and effectiveness  -- Discharge Goals: Return home with outpatient referrals for mental health follow-up including medication management/psychotherapy - Patient would benefit from pain management specialist, who will not prescribe the patient opioids. Previously was on Suboxone and his mom reports he did well. - Patient has declined residential rehab, but is interested in outpatient options - Recommend CD IOP with M[facility] in [facility]  I certify that inpatient  services furnished can reasonably be expected to improve the patient's condition.    Corky Sox, MD 1/13/202412:17 PM

## 2022-08-06 NOTE — Progress Notes (Signed)
   08/06/22 2300  Psych Admission Type (Psych Patients Only)  Admission Status Voluntary  Psychosocial Assessment  Patient Complaints Anxiety  Eye Contact Fair  Facial Expression Anxious  Affect Anxious  Speech Logical/coherent  Interaction Assertive  Motor Activity Fidgety  Appearance/Hygiene Improved  Behavior Characteristics Cooperative;Appropriate to situation  Mood Pleasant  Thought Process  Coherency WDL  Content WDL  Delusions None reported or observed  Perception WDL  Hallucination None reported or observed  Judgment Poor  Confusion None  Danger to Self  Current suicidal ideation? Denies  Agreement Not to Harm Self Yes  Description of Agreement verbal  Danger to Others  Danger to Others None reported or observed   Pt is worried about the up coming back surgery on the 1/25. The family brought in a letter from his doctor with instruction. The letter placed in the pt chart.

## 2022-08-06 NOTE — [facility] Group Notes (Signed)
.  Psychoeducational Group Note    Date:  08/06/2022 Time:1300-1400    Purpose of Group: . The group focus' on teaching patients on how to identify their needs and their Life Skills:  A group where two lists are made. What people need and what are things that we do that are unhealthy. The lists are developed by the patients and it is explained that we often do the actions that are not healthy to get our list of needs met.  Goal:: to develop the coping skills needed to get their needs met  Participation Level:did not attend Keith [facility]

## 2022-08-06 NOTE — Progress Notes (Signed)
Adult Psychoeducational Group Note  Date:  08/06/2022 Time:  10:46 PM  Group Topic/Focus:  Wrap-Up Group:   The focus of this group is to help patients review their daily goal of treatment and discuss progress on daily workbooks.  Participation Level:  Active  Participation Quality:  Appropriate  Affect:  Appropriate  Cognitive:  Appropriate  Insight: Appropriate  Engagement in Group:  Engaged  Modes of Intervention:  Discussion  Additional Comments:  Pt stated his goal for the day was to listen and learn. Pt met goal.  Elise Benne 08/06/2022, 10:46 PM

## 2022-08-06 NOTE — [facility] Suicide Risk Assessment (Signed)
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   thiamine (Vitamin B-1) tablet 100 mg  100 mg Oral Daily [name], MD   100 mg at 08/06/22 0751   traZODone (DESYREL) tablet 50 mg  50 mg Oral QHS PRN [name], MD        PTA Medications:        Medications Prior to Admission  Medication Sig Dispense Refill Last Dose   albuterol (VENTOLIN HFA) 108 (90 Base) MCG/ACT inhaler Inhale 2 puffs into the lungs every 6 (six) hours as needed for wheezing or shortness of breath. 18 g 6     aspirin EC 81 MG tablet Take 1 tablet (81 mg total) by mouth daily. Swallow whole. 30 tablet 12     atorvastatin (LIPITOR) 80 MG tablet Take 1 tablet (80 mg total) by mouth at bedtime. [place]  tablet 0     Budeson-Glycopyrrol-Formoterol (BREZTRI AEROSPHERE) 160-9-4.8 MCG/ACT AERO Inhale 2 puffs into the lungs in the morning and at bedtime.         busPIRone (BUSPAR) 15 MG tablet Take 1  tablet (15 mg total) by mouth daily. 60 tablet 3     cyclobenzaprine (FLEXERIL) 10 MG tablet Take 1 tablet (10 mg total) by mouth 2 (two) times daily as needed for muscle spasms. 20 tablet 0     diclofenac Sodium (VOLTAREN) 1 % GEL Apply 2 g topically 4 (four) times daily. 2 g 0     divalproex (DEPAKOTE) 500 MG DR tablet Take 1 tablet (500 mg total) by mouth 2 (two) times daily. 60 tablet 3     docusate sodium (COLACE) 100 MG capsule Take 1 capsule (100 mg total) by mouth daily as needed for mild constipation. (Patient not taking: Reported on 08/03/2022) 10 capsule 0     DULoxetine (CYMBALTA) 60 MG capsule Take 1 capsule (60 mg total) by mouth daily. (Patient not taking: Reported on 08/03/2022) 30 capsule 0     enalapril (VASOTEC) 2.5 MG tablet Take 1 tablet (2.5 mg total) by mouth 2 (two) times daily. (Patient not taking: Reported on 08/03/2022) 60 tablet 0     fluticasone (FLONASE) 50 MCG/ACT nasal spray Place 1 spray into both nostrils at bedtime. (Patient not taking: Reported on 08/03/2022) 9.9 mL 0     gabapentin (NEURONTIN) 300 MG capsule Take 300 mg by mouth 3 (three) times daily.         loratadine (CLARITIN) 10 MG tablet Take 1 tablet (10 mg total) by mouth daily. 30 tablet 0     Multiple Vitamin (MULTIVITAMIN WITH MINERALS) TABS tablet Take 1 tablet by mouth daily. 30 tablet 0     nitroGLYCERIN (NITROSTAT) 0.3 MG SL tablet Place 1 tablet (0.3 mg total) under the tongue every 5 (five) minutes as needed for chest pain. Maximum of 3 doses daily. 90 tablet 0     pantoprazole (PROTONIX) 20 MG tablet Take 1 tablet (20 mg total) by mouth daily for 15 days. (Patient not taking: Reported on 08/03/2022) 15 tablet 0     rOPINIRole (REQUIP) 2 MG tablet Take 1 tablet (2 mg total) by mouth at bedtime. TAKE 1 TABLET BY MOUTH EVERYDAY AT BEDTIME Strength: 2 mg (Patient taking differently: Take 2 mg by mouth at bedtime.) 30 tablet 0     tamsulosin (FLOMAX) 0.4 MG CAPS capsule Take 1 capsule (0.4 mg total) by  mouth daily. 30 capsule 0     thiamine (VITAMIN B-1) 100 MG tablet Take 1 tablet (100 mg total) by mouth daily. (Patient not taking: Reported on 08/03/2022) 30 tablet 0        Musculoskeletal: Strength & Muscle Tone: within normal limits Gait & Station: normal Patient leans: N/A     Psychiatric Specialty Exam: Physical Exam Constitutional:      Appearance: the patient is not toxic-appearing.  Pulmonary:     Effort: Pulmonary effort is normal.  Neurological:     General: No focal deficit present.     Mental Status: the patient is alert and oriented to person, place, and time.    Review of Systems  Respiratory:  Negative for shortness of breath.   Cardiovascular:  Negative for chest pain.  Gastrointestinal:  Negative for abdominal pain, constipation, diarrhea, nausea and vomiting.  Neurological:  Negative for headaches.       BP 104/72 (BP Location: Right Arm)  Pulse (!) 59   Temp 98.2 F (36.8 C) (Oral)   Resp 20   Ht '6\' 3"'$  (1.905 m)   Wt 89.8 kg   SpO2 98%   BMI 24.75 kg/m   General Appearance: Fairly Groomed  Eye Contact:  Good  Speech:  Clear and Coherent  Volume:  Normal  Mood:  depressed, anxious  Affect:  Congruent  Thought Process:  Coherent  Orientation:  Full (Time, Place, and Person)  Thought Content: Logical   Suicidal Thoughts:  No  Homicidal Thoughts:  No  Memory:  Immediate;   Good  Judgement:  fair  Insight:  fair  Psychomotor Activity:  Normal  Concentration:  Concentration: Good  Recall:  Good  Fund of Knowledge: Good  Language: Good  Akathisia:  No  Handed:    AIMS (if indicated): not done  Assets:  Communication Skills Desire for Improvement Financial Resources/Insurance Housing Leisure Time Physical Health  ADL's:  Intact  Cognition: WNL  Sleep:  Fair        Treatment Plan Summary: Daily contact with patient to assess and evaluate symptoms and progress in treatment and Medication management     Physician Treatment Plan for  Primary Diagnosis: Substance induced mood disorder ([place]) Long Term Goal(s): Improvement in symptoms so as ready for discharge   Short Term Goals: Ability to disclose and discuss suicidal ideas   Physician Treatment Plan for Secondary Diagnosis: Principal Problem:   Substance induced mood disorder ([facility]) Active Problems:   Chronic pain syndrome   Cocaine use   Angina at rest   CAD S/P percutaneous coronary angioplasty   Suicide attempt by drug overdose ([place])   Long Term Goal(s): Improvement in symptoms so as ready for discharge   Short Term Goals: Ability to identify and develop effective coping behaviors will improve     ASSESSMENT:   Diagnoses / Active Problems: Substance-induced mood disorder Cocaine use disorder Intermittent explosive disorder   PLAN: Safety and Monitoring:             -- Voluntary admission to inpatient psychiatric unit for safety, stabilization and treatment             -- Daily contact with patient to assess and evaluate symptoms and progress in treatment             -- Patient's case to be discussed in multi-disciplinary team meeting             -- Observation Level : q15 minute checks             -- Vital signs:  q12 hours             -- Precautions: suicide, elopement, and assault   2. Psychiatric Diagnoses and Treatment:  -Increase home Cymbalta from 60 mg to 90 mg, to help with depression and pain -Continue Depakote at 500 mg twice daily (dose increased at medical hospital) - VPA level 1/16, ordered as trough level - Do not restart home Zyprexa given overdose - Replace with trazodone 100 mg nightly for sleep - Will reassess role of Requip --  The risks/benefits/side-effects/alternatives to this medication were discussed in detail with the patient and time was given for questions. The patient consents to medication trial.             -- Encouraged patient to participate in unit milieu and in scheduled group therapies              -- Short Term  Goals:  Ability to verbalize feelings will improve             -- Long Term Goals: Improvement in symptoms so as ready for discharge                3. Medical Issues Being Addressed:              Tobacco Use Disorder             -- Nicotine patch '21mg'$ /24 hours ordered             -- Smoking cessation encouraged - Gabapentin 100 mg 3 times daily -> 300 mg 3 times daily for chronic pain - Patient denies opioid abuse, UDS negative for opioids   4. Discharge Planning:              -- Social work and case management to assist with discharge planning and identification of hospital follow-up needs prior to discharge             -- Estimated LOS: 5-7 days             -- Discharge Concerns: Need to establish a safety plan; Medication compliance and effectiveness             -- Discharge Goals: Return home with outpatient referrals for mental health follow-up including medication management/psychotherapy - Patient would benefit from pain management specialist, who will not prescribe the patient opioids. Previously was on Suboxone and his mom reports he did well. - Patient has declined residential rehab, but is interested in outpatient options - Recommend CD IOP with [facility] in [facility]    I certify that inpatient services furnished can reasonably be expected to improve the patient's condition.   Corky Sox, MD 08/06/2022, 12:48 PM

## 2022-08-07 LAB — SEDIMENTATION RATE: Sed Rate: 7 mm/hr (ref 0–16)

## 2022-08-07 LAB — C-REACTIVE PROTEIN: CRP: 1 mg/dL — ABNORMAL HIGH (ref <1.0)

## 2022-08-07 MED ORDER — BUDESON-GLYCOPYRROL-FORMOTEROL 160-9-4.8 MCG/ACT IN AERO
2.0000 | INHALATION_SPRAY | RESPIRATORY_TRACT | Status: DC
  Administered 2022-08-07 – 2022-08-10 (×6): 2 via RESPIRATORY_TRACT

## 2022-08-07 MED ORDER — HYDROCORTISONE 1 % EX CREA
TOPICAL_CREAM | Freq: Two times a day (BID) | CUTANEOUS | Status: DC | PRN
  Filled 2022-08-07: qty 28

## 2022-08-07 MED ORDER — UMECLIDINIUM BROMIDE 62.5 MCG/ACT IN AEPB: 1.0000 | INHALATION_SPRAY | Freq: Every day | RESPIRATORY_TRACT | Status: DC

## 2022-08-07 MED ORDER — FLUTICASONE FUROATE-VILANTEROL 100-25 MCG/ACT IN AEPB: 1.0000 | INHALATION_SPRAY | Freq: Every day | RESPIRATORY_TRACT | Status: DC

## 2022-08-07 MED ORDER — ROPINIROLE HCL 1 MG PO TABS
1.0000 mg | ORAL_TABLET | Freq: Every day | ORAL | Status: DC
  Administered 2022-08-07 – 2022-08-09 (×3): 1 mg via ORAL
  Filled 2022-08-07 (×5): qty 1

## 2022-08-07 MED ORDER — GABAPENTIN 100 MG PO CAPS
200.0000 mg | ORAL_CAPSULE | Freq: Three times a day (TID) | ORAL | Status: AC
  Administered 2022-08-07 – 2022-08-08 (×3): 200 mg via ORAL
  Filled 2022-08-07 (×4): qty 2

## 2022-08-07 NOTE — Progress Notes (Signed)
D) Pt received calm, visible, participating in milieu, and in no acute distress. Pt A & O x4. Pt denies SI, HI, A/ V H, depression, anxiety and rates pain 6/10 pain at this time. A) Pt encouraged to drink fluids. Pt encouraged to come to staff with needs. Pt encouraged to attend and participate in groups. Pt encouraged to set reachable goals.  R) Pt remained safe on unit, in no acute distress, will continue to assess.     08/07/22 2000  Psych Admission Type (Psych Patients Only)  Admission Status Voluntary  Psychosocial Assessment  Patient Complaints Anxiety  Eye Contact Fair  Facial Expression Anxious  Affect Anxious  Speech Logical/coherent  Interaction Assertive  Motor Activity Fidgety  Appearance/Hygiene Improved  Behavior Characteristics Calm;Cooperative  Mood Pleasant  Thought Process  Coherency WDL  Content WDL  Delusions None reported or observed  Perception WDL  Hallucination None reported or observed  Judgment Poor  Confusion None  Danger to Self  Current suicidal ideation? Denies  Agreement Not to Harm Self Yes  Description of Agreement verbal  Danger to Others  Danger to Others None reported or observed

## 2022-08-07 NOTE — Group Note (Signed)
LCSW Group Therapy Note   Group Date: 08/07/2022 Start Time: 1000 End Time: 1100  Type of Therapy and Topic:  Group Therapy: Are You Under Stress?!?!  Participation Level:  Active  Description of Group: This process group involved patients examining stress and how it impacts their lives. Distress and eustress definitions were explained and patients identified both good and bad stressors in their lives. Accompanying worksheet was used to help patients identify the physical and emotional symptoms of stress and techniques/copings skills they utilize to help reduce these symptoms.    Therapeutic Goals:  1.  Patients will talk about their experiences with distress and eustress. 2. Patients will identify stressors in their lives and the physical and emotional  symptoms they experience while under stress. 3. Patients to identify actions/coping skills that they utilize to help ameliorate  symptoms of stress.  Summary of Patient Progress:  Patient came to group 15 minutes in. Patient accepted and followed along with provided worksheet. Patient provided positive insight about his self-care and how he wanted to work on certain things in his life once he discharges. Patient was respectful of his peers and attentive with discussion about the different self-care techniques that are important to him. Patient remain in group the entire time.    Therapeutic Modalities:  Cognitive Behavioral Therapy Solution-Focused Therapy   Sherre Lain, LCSWA 08/07/2022  1:46 PM

## 2022-08-07 NOTE — Progress Notes (Signed)
[facility] MD Progress Note  08/07/2022 1:38 PM CHARLENE COWDREY  MRN:  [id number] Subjective:   Keith Gibbs is a 62 year old male with a history of numerous substance use disorders (opioids, cocaine, alcohol), reported schizoaffective disorder-bipolar type, familiar to this provider from an October stay in the [facility]. He has a medical history of CAD w unstable angina, s/p PCI, and chronic pain. He was medically admitted for several days after an overdose on home Zyprexa. He was admitted voluntarily to inpatient psychiatry. After initial evaluation, feel that the patient's substance use is predominant in his psychiatric symptoms. Diagnosis is substance induced mood disorder in context of frequent cocaine use. Feel his mood will improve greatly with abstinence from drugs.  Patient also appears to have intermittent explosive disorder, currently managed with Depakote.  Feel strongly that the patient does not have schizoaffective disorder-bipolar type.  Patient describes previous "manic" episodes as panic attacks, and given his near constant substance use we cannot diagnose him with a manic episode.  Patient desires outpatient follow-up, so that he can attend his surgery on the 25th of this month.   Today the patient exhibits improved affect and insight.  He reports that he has been journaling frequently regarding his pain and his mood.  He reports learning from group therapy sessions and says that he was able to use insight to avoid an argument with his mother on the phone.  He reports improved depression and anxiety.  He reports difficulty sleeping due to pain.  Reports benefit from Robaxin, counseled on the fact that this medication is for short-term use only will not be prescribed on discharge.  The patient denies auditory/visual hallucinations and first rank symptoms.  He reports good mood, appetite, and sleep. He denies suicidal and homicidal thoughts. He denies side effects from his medications.  Review of  systems as below.    Principal Problem: Substance induced mood disorder (HCC) Diagnosis: Principal Problem:   Substance induced mood disorder (HCC) Active Problems:   Chronic pain syndrome   Cocaine use   Angina at rest   CAD S/P percutaneous coronary angioplasty   Suicide attempt by drug overdose ([place])  Total Time spent with patient: 15 minutes  Past Psychiatric History: as above  Past Medical History:  Past Medical History:  Diagnosis Date   Anginal pain ([place])    Anxiety    Arthritis    Bipolar 1 disorder (HCC)    Bursitis    CAD (coronary artery disease)    Chronic pain    COPD (chronic obstructive pulmonary disease) (HCC)    Current use of long term anticoagulation    DAPT (ASA + clopidogrel)   Depression    Diverticulitis    Dyspnea    GERD (gastroesophageal reflux disease)    Grade I diastolic dysfunction    Hepatitis C 2012   No longer has Hep C   HLD (hyperlipidemia)    Hypertension    MI (myocardial infarction) ([place])    Polysubstance abuse (HCC)    cocaine, marijuana, ETOH   PUD (peptic ulcer disease)    S/P angioplasty with stent 06/10/2016   a.) 90% stenosis of pLAD to mLAD - 2.5 x 18 mm Xience Alpine (DES x 1) placed to pLAD   S/P PTCA (percutaneous transluminal coronary angioplasty) 12/04/2019   a.) 60% in stent restenosis of DES to pLAD; LVEF 65%.   Schizophrenia ([place])    Stroke [facility])    Valvular insufficiency    a.) Mild MR, TR,  PR; mild to moderate AR on 03/05/2018 TTE    Past Surgical History:  Procedure Laterality Date   ABDOMINAL SURGERY     removed small piece of intestines due to [facility] Diverticulosis   APPENDECTOMY     BACK SURGERY     CARDIAC CATHETERIZATION Left 06/10/2016   Procedure: Left Heart Cath and Coronary Angiography;  Surgeon: Dionisio David, MD;  Location: [place] CV LAB;  Service: Cardiovascular;  Laterality: Left;   CARDIAC CATHETERIZATION N/A 06/10/2016   Procedure: Coronary Stent Intervention;  Surgeon: Yolonda Kida, MD;  Location: [place] CV LAB;  Service: Cardiovascular;  Laterality: N/A;   CHOLECYSTECTOMY N/A 02/17/2022   Procedure: LAPAROSCOPIC CHOLECYSTECTOMY;  Surgeon: Kieth Brightly Arta Bruce, MD;  Location: [place];  Service: General;  Laterality: N/A;   COLON SURGERY     COLONOSCOPY     COLONOSCOPY WITH PROPOFOL N/A 01/05/2017   Procedure: COLONOSCOPY WITH PROPOFOL;  Surgeon: Jonathon Bellows, MD;  Location: [facility] ENDOSCOPY;  Service: Endoscopy;  Laterality: N/A;   COLONOSCOPY WITH PROPOFOL N/A 02/13/2020   Procedure: COLONOSCOPY WITH PROPOFOL;  Surgeon: Virgel Manifold, MD;  Location: [facility] ENDOSCOPY;  Service: Endoscopy;  Laterality: N/A;   CORONARY ANGIOPLASTY WITH STENT PLACEMENT     ESOPHAGOGASTRODUODENOSCOPY (EGD) WITH PROPOFOL N/A 01/05/2017   Procedure: ESOPHAGOGASTRODUODENOSCOPY (EGD) WITH PROPOFOL;  Surgeon: Jonathon Bellows, MD;  Location: [facility] ENDOSCOPY;  Service: Endoscopy;  Laterality: N/A;   ESOPHAGOGASTRODUODENOSCOPY (EGD) WITH PROPOFOL N/A 02/13/2020   Procedure: ESOPHAGOGASTRODUODENOSCOPY (EGD) WITH PROPOFOL;  Surgeon: Virgel Manifold, MD;  Location: [facility] ENDOSCOPY;  Service: Endoscopy;  Laterality: N/A;   INTRAVASCULAR PRESSURE WIRE/FFR STUDY N/A 12/04/2019   Procedure: INTRAVASCULAR PRESSURE WIRE/FFR STUDY;  Surgeon: Sherren Mocha, MD;  Location: [place] CV LAB;  Service: Cardiovascular;  Laterality: N/A;   KNEE ARTHROSCOPY WITH MEDIAL MENISECTOMY Right 09/05/2017   Procedure: KNEE ARTHROSCOPY WITH MEDIAL AND LATERAL  MENISECTOMY PARTIAL SYNOVECTOMY;  Surgeon: Hessie Knows, MD;  Location: [facility] ORS;  Service: Orthopedics;  Laterality: Right;   LEFT HEART CATH AND CORONARY ANGIOGRAPHY N/A 12/04/2019   Procedure: LEFT HEART CATH AND CORONARY ANGIOGRAPHY;  Surgeon: Sherren Mocha, MD;  Location: [place] CV LAB;  Service: Cardiovascular;  Laterality: N/A;   SHOULDER SURGERY Right 04/09/2012   SPINE SURGERY     TOTAL KNEE ARTHROPLASTY Right 01/19/2021    Procedure: TOTAL KNEE ARTHROPLASTY - Rachelle Hora to Assist;  Surgeon: Hessie Knows, MD;  Location: [facility] ORS;  Service: Orthopedics;  Laterality: Right;   Family History:  Family History  Problem Relation Age of Onset   Osteoarthritis Mother    Heart disease Mother    Hypertension Mother    Depression Mother    Heart disease Father    Early death Father    Hypertension Father    Heart attack Father    Hypertension Sister    Prostate cancer Neg Hx    Bladder Cancer Neg Hx    Kidney cancer Neg Hx    Family Psychiatric  History: none Social History:  Social History   Substance and Sexual Activity  Alcohol Use Yes   Alcohol/week: 6.0 standard drinks of alcohol   Types: 6 Cans of beer per week   Comment: occassionally      Social History   Substance and Sexual Activity  Drug Use Not Currently   Types: Cocaine, Marijuana   Comment: last on saturday    Social History   Socioeconomic History   Marital status: Widowed    Spouse name: Not  on file   Number of children: Not on file   Years of education: Not on file   Highest education level: Not on file  Occupational History   Occupation: disability   Occupation: stocking at gas station    Comment: 2 days per week  Tobacco Use   Smoking status: Some Days    Packs/day: 0.50    Years: 32.00    Total pack years: 16.00    Types: Cigarettes    Last attempt to quit: 10/25/2021    Years since quitting: 0.7   Smokeless tobacco: Never   Tobacco comments:    Continues to smoke approximately 5 cigarettes per day.  Has been 2 days since las cigarette.  Has not bought any more.  Using patches and gum.  5/23 hfb  Vaping Use   Vaping Use: Never used  Substance and Sexual Activity   Alcohol use: Yes    Alcohol/week: 6.0 standard drinks of alcohol    Types: 6 Cans of beer per week    Comment: occassionally    Drug use: Not Currently    Types: Cocaine, Marijuana    Comment: last on saturday   Sexual activity: Yes    Partners:  Female    Birth control/protection: None  Other Topics Concern   Not on file  Social History Narrative   Not on file   Social Determinants of Health   Financial Resource Strain: Not on file  Food Insecurity: No Food Insecurity (08/05/2022)   Hunger Vital Sign    Worried About Running Out of Food in the Last Year: Never true    Ran Out of Food in the Last Year: Never true  Transportation Needs: No Transportation Needs (08/05/2022)   PRAPARE - Hydrologist (Medical): No    Lack of Transportation (Non-Medical): No  Physical Activity: Not on file  Stress: Not on file  Social Connections: Not on file   Additional Social History:                         Sleep: Fair  Appetite:  Fair  Current Medications: Current Facility-Administered Medications  Medication Dose Route Frequency Provider Last Rate Last Admin   albuterol (PROVENTIL) (2.5 MG/3ML) 0.083% nebulizer solution 3 mL  3 mL Inhalation Q6H PRN [name], MD       aspirin EC tablet 81 mg  81 mg Oral Daily [name], MD   81 mg at 08/07/22 6945   atorvastatin (LIPITOR) tablet 80 mg  80 mg Oral QHS [name], MD   80 mg at 08/06/22 2125   Budeson-Glycopyrrol-Formoterol 160-9-4.8 MCG/ACT AERO 2 puff  2 puff Inhalation BH-qamhs Massengill, Nathan, MD       busPIRone (BUSPAR) tablet 15 mg  15 mg Oral Daily [name], MD   15 mg at 08/07/22 0388   cyclobenzaprine (FLEXERIL) tablet 10 mg  10 mg Oral BID PRN [name], MD   10 mg at 08/07/22 0333   divalproex (DEPAKOTE) DR tablet 500 mg  500 mg Oral BID [name], MD   500 mg at 08/07/22 8280   docusate sodium (COLACE) capsule 100 mg  100 mg Oral Daily PRN [name], MD       DULoxetine (CYMBALTA) DR capsule 90 mg  90 mg Oral Daily Corky Sox, MD   90 mg at 08/07/22 0833   enalapril (VASOTEC) tablet 2.5 mg  2.5 mg Oral BID [name], MD  2.5 mg at 08/07/22 0828   fluticasone (FLONASE) 50 MCG/ACT  nasal spray 1 spray  1 spray Each Nare Daily [name], MD   1 spray at 05/18/84 2778   folic acid (FOLVITE) tablet 1 mg  1 mg Oral Daily [name], MD   1 mg at 08/07/22 0827   hydrocortisone cream 1 %   Topical BID PRN Corky Sox, MD       hydrOXYzine (ATARAX) tablet 25 mg  25 mg Oral TID PRN Corky Sox, MD       ibuprofen (ADVIL) tablet 600 mg  600 mg Oral Q6H PRN [name], MD   600 mg at 08/07/22 0833   loratadine (CLARITIN) tablet 10 mg  10 mg Oral Daily [name], MD   10 mg at 08/07/22 0828   OLANZapine zydis (ZYPREXA) disintegrating tablet 5 mg  5 mg Oral Q8H PRN Corky Sox, MD       And   LORazepam (ATIVAN) tablet 1 mg  1 mg Oral PRN Corky Sox, MD       And   ziprasidone (GEODON) injection 20 mg  20 mg Intramuscular PRN Corky Sox, MD       meloxicam [facility]) tablet 7.5 mg  7.5 mg Oral Daily [name], MD   7.5 mg at 08/07/22 2423   multivitamin with minerals tablet 1 tablet  1 tablet Oral Daily [name], MD   1 tablet at 08/07/22 0827   nicotine (NICODERM CQ - dosed in mg/24 hours) patch 21 mg  21 mg Transdermal Daily [name], MD   21 mg at 08/07/22 5361   Oral care mouth rinse  15 mL Mouth Rinse PRN [name], MD       pantoprazole (PROTONIX) EC tablet 20 mg  20 mg Oral Daily [name], MD   20 mg at 08/07/22 4431   rOPINIRole (REQUIP) tablet 2 mg  2 mg Oral QHS [name], MD   2 mg at 08/06/22 2125   tamsulosin (FLOMAX) capsule 0.4 mg  0.4 mg Oral Daily [name], MD   0.4 mg at 08/07/22 5400   thiamine (Vitamin B-1) tablet 100 mg  100 mg Oral Daily [name], MD   100 mg at 08/07/22 8676   traZODone (DESYREL) tablet 100 mg  100 mg Oral QHS Corky Sox, MD   100 mg at 08/06/22 2125    Lab Results:  Results for orders placed or performed during the hospital encounter of 08/05/22 (from the past 48 hour(s))  Sedimentation rate     Status: None   Collection Time: 08/07/22  6:32 AM   Result Value Ref Range   Sed Rate 7 0 - 16 mm/hr    Comment: Performed at [facility], [place] [address] [place], [place] [address]  C-reactive protein     Status: Abnormal   Collection Time: 08/07/22  6:32 AM  Result Value Ref Range   CRP 1.0 (H) <1.0 mg/dL    Comment: Performed at [place] [address], [place], [place] [address]    Blood Alcohol level:  Lab Results  Component Value Date   [facility] <10 08/03/2022   ETH <10 24/58/0998    Metabolic Disorder Labs: Lab Results  Component Value Date   HGBA1C 5.2 04/26/2022   MPG 102.54 04/26/2022   MPG 103 12/28/2016   No results found for: "PROLACTIN" Lab Results  Component Value Date   CHOL 83 04/26/2022   TRIG 52 04/26/2022  HDL 28 (L) 04/26/2022   CHOLHDL 3.0 04/26/2022   VLDL 10 04/26/2022   LDLCALC 45 04/26/2022   LDLCALC 41 08/31/2021  Psychiatric Specialty Exam: Physical Exam Constitutional:      Appearance: the patient is not toxic-appearing.  Pulmonary:     Effort: Pulmonary effort is normal.  Neurological:     General: No focal deficit present.     Mental Status: the patient is alert and oriented to person, place, and time.   Review of Systems  Respiratory:  Negative for shortness of breath.   Cardiovascular:  Negative for chest pain.  Gastrointestinal:  Negative for abdominal pain, constipation, diarrhea, nausea and vomiting.  Neurological:  Negative for headaches.      BP 137/74 (BP Location: Left Arm)   Pulse 62   Temp 97.8 F (36.6 C) (Oral)   Resp 20   Ht '6\' 3"'$  (1.905 m)   Wt 89.8 kg   SpO2 98%   BMI 24.75 kg/m   General Appearance: Fairly Groomed  Eye Contact:  Good  Speech:  Clear and Coherent  Volume:  Normal  Mood:  Euthymic  Affect:  Congruent  Thought Process:  Coherent  Orientation:  Full (Time, Place, and Person)  Thought Content: Logical   Suicidal Thoughts:  No  Homicidal Thoughts:  No  Memory:  Immediate;   Good  Judgement:  fair  Insight:   fair  Psychomotor Activity:  Normal  Concentration:  Concentration: Good  Recall:  Good  Fund of Knowledge: Good  Language: Good  Akathisia:  No  Handed:    AIMS (if indicated): not done  Assets:  Communication Skills Desire for Improvement Financial Resources/Insurance Housing Leisure Time Physical Health  ADL's:  Intact  Cognition: WNL  Sleep:  Fair    Treatment Plan Summary: Daily contact with patient to assess and evaluate symptoms and progress in treatment and Medication management  ASSESSMENT:   Diagnoses / Active Problems: Substance-induced mood disorder Cocaine use disorder Intermittent explosive disorder   PLAN: Safety and Monitoring:             -- Voluntary admission to inpatient psychiatric unit for safety, stabilization and treatment             -- Daily contact with patient to assess and evaluate symptoms and progress in treatment             -- Patient's case to be discussed in multi-disciplinary team meeting             -- Observation Level : q15 minute checks             -- Vital signs:  q12 hours             -- Precautions: suicide, elopement, and assault   2. Psychiatric Diagnoses and Treatment:  - Continue home Cymbalta at 90 mg for depression, increased from home dose of 60 mg -Continue Depakote at 500 mg twice daily (dose increased at medical hospital) - VPA level 1/16, ordered as trough level - Do not restart home Zyprexa given overdose - Replace with trazodone 100 mg nightly for sleep -Reduce Requip from 2 mg to 1 mg nightly.  The medication is used to treat his restless leg syndrome.  Feel this is a concerning medication for the patient to be on given his anger issues and impulsivity.  Dopamine agonists have certainly been shown to increase impulsivity in Parkinson's.  We will reassess the need for returning to his home dose if  his restless leg syndrome gets worse. --  The risks/benefits/side-effects/alternatives to this medication were discussed  in detail with the patient and time was given for questions. The patient consents to medication trial.             -- Encouraged patient to participate in unit milieu and in scheduled group therapies              -- Short Term Goals: Ability to verbalize feelings will improve             -- Long Term Goals: Improvement in symptoms so as ready for discharge                3. Medical Issues Being Addressed:              Tobacco Use Disorder             -- Nicotine patch '21mg'$ /24 hours ordered             -- Smoking cessation encouraged - Gabapentin 100 mg 3 times daily -> 300 mg 3 times daily for chronic pain.  Will need to be reordered at the 300 mg dose if the patient tolerates the 200 mg dose well. - Patient denies opioid abuse, UDS negative for opioids   4. Discharge Planning:              -- Social work and case management to assist with discharge planning and identification of hospital follow-up needs prior to discharge             -- Estimated LOS: 5-7 days             -- Discharge Concerns: Need to establish a safety plan; Medication compliance and effectiveness             -- Discharge Goals: Return home with outpatient referrals for mental health follow-up including medication management/psychotherapy - Patient would benefit from pain management specialist, who will not prescribe the patient opioids. Previously was on Suboxone and his mom reports he did well. - Patient has declined residential rehab, but is interested in outpatient options - Recommend CD IOP with Malka So in [place], MD 08/07/2022, 1:38 PM

## 2022-08-07 NOTE — [facility] Group Notes (Signed)
[place] Group Notes:  (Nursing/MHT/Case Management/Adjunct)  Date:  08/07/2022  Time:  8:51 PM  Type of Therapy:  Group Therapy  Participation Level:  Active  Participation Quality:  Appropriate  Affect:  Appropriate  Cognitive:  Appropriate  Insight:  Good  Engagement in Group:  Engaged  Modes of Intervention:  Discussion  Summary of Progress/Problems:INTERACT WITH OTHERS AND PARTICIPATE WITH WITH GROUP  Floyde Parkins 08/07/2022, 8:51 PM

## 2022-08-07 NOTE — [facility] Group Notes (Signed)
Adult Psychoeducational Group  Date:  08/07/2022 Time: 1300-1400  Group Topic/Focus: Continuation of the group from Saturday. Looking at the lists that were created and talking about what needs to be done with the homework of 30 positives about themselves.                                     Talking about taking their power back and helping themselves to develop a positive self esteem.      Participation Quality:  Appropriate  Affect:  Appropriate  Cognitive:  Oriented  Insight: Improving  Engagement in Group:  Engaged  Modes of Intervention:  Activity, Discussion, Education, and Support  Additional Comments:  Rates his energy at a 8/10. Participated fully in the group.

## 2022-08-07 NOTE — [facility] Group Notes (Signed)
Adult Psychoeducational Group Note Date:  08/07/2022 Time:  0900-1000 Group Topic/Focus: PROGRESSIVE RELAXATION. A group where deep breathing is taught and tensing and relaxation muscle groups is used. Imagery is used as well.  Pts are asked to imagine 3 pillars that hold them up when they are not able to hold themselves up and to share that with the group.   Participation Level:  Active  Participation Quality:  Appropriate  Affect:  Appropriate  Cognitive:  Approprate  Insight: Improving  Engagement in Group:  Engaged  Modes of Intervention:  deep breathing, Imagery. Discussion  Additional Comments:  Rates his energy at a 7/10. States what holds him up is his mother, God and being here in the hospital and making friends   : Keith Gibbs

## 2022-08-08 ENCOUNTER — Encounter (HOSPITAL_COMMUNITY): Payer: Self-pay

## 2022-08-08 DIAGNOSIS — F1994 Other psychoactive substance use, unspecified with psychoactive substance-induced mood disorder: Secondary | ICD-10-CM

## 2022-08-08 MED ORDER — ALBUTEROL SULFATE HFA 108 (90 BASE) MCG/ACT IN AERS
2.0000 | INHALATION_SPRAY | Freq: Four times a day (QID) | RESPIRATORY_TRACT | Status: DC | PRN
  Administered 2022-08-08: 2 via RESPIRATORY_TRACT

## 2022-08-08 MED ORDER — GABAPENTIN 300 MG PO CAPS
300.0000 mg | ORAL_CAPSULE | Freq: Three times a day (TID) | ORAL | Status: DC
  Administered 2022-08-08 – 2022-08-10 (×6): 300 mg via ORAL
  Filled 2022-08-08 (×12): qty 1

## 2022-08-08 NOTE — Progress Notes (Addendum)
[facility] MD Progress Note  08/08/2022 6:59 AM Keith Gibbs  MRN:  [id number] Subjective:   Keith Gibbs is a 62 year old male with a history of numerous substance use disorders (opioids, cocaine, alcohol), reported schizoaffective disorder-bipolar type, familiar to this provider from an October stay in the [facility]. He has a medical history of CAD w unstable angina, s/p PCI, and chronic pain. He was medically admitted for several days after an overdose on home Zyprexa. He was admitted voluntarily to inpatient psychiatry. After initial evaluation, feel that the patient's substance use is predominant in his psychiatric symptoms. Diagnosis is substance induced mood disorder in context of frequent cocaine use. Feel his mood will improve greatly with abstinence from drugs.  Patient also appears to have intermittent explosive disorder, currently managed with Depakote.  Feel strongly that the patient does not have schizoaffective disorder-bipolar type.  Patient describes previous "manic" episodes as panic attacks, and given his near constant substance use we cannot diagnose him with a manic episode.  Patient desires outpatient follow-up, so that he can attend his surgery on the 25th of this month.   Today, patient reports feeling better regarding depression. He notes poor sleep, sleeping about 4 hours overnight, and he attributes this to pain. He reports improving appetite. Most of his concern stems from his medical conditions that cause him physical pain. He notes having a pre op appointment on 1/19 so he requests to be discharged on 1/17. He feels his mood has overall been improving and denies side effects from his medications. He provides Denies SI, HI, and AVH.  24 hour chart review The patient's chart was reviewed and nursing notes were reviewed. The patient's case was discussed in multidisciplinary team meeting. Per [facility], patient was taking medications appropriately. Per nursing, patient is calm and cooperative  and attended 4 group sessions. The following as needed medications were given: flexaril 3x, hydrocortisone cream, advil 3x     Principal Problem: Substance induced mood disorder ([place]) Diagnosis: Principal Problem:   Substance induced mood disorder (HCC) Active Problems:   Chronic pain syndrome   Cocaine use   Angina at rest   CAD S/P percutaneous coronary angioplasty   Suicide attempt by drug overdose ([place])  Total Time spent with patient: 30 minutes  Past Psychiatric History: as above  Past Medical History:  Past Medical History:  Diagnosis Date   Anginal pain ([place])    Anxiety    Arthritis    Bipolar 1 disorder ([place])    Bursitis    CAD (coronary artery disease)    Chronic pain    COPD (chronic obstructive pulmonary disease) (HCC)    Current use of long term anticoagulation    DAPT (ASA + clopidogrel)   Depression    Diverticulitis    Dyspnea    GERD (gastroesophageal reflux disease)    Grade I diastolic dysfunction    Hepatitis C 2012   No longer has Hep C   HLD (hyperlipidemia)    Hypertension    MI (myocardial infarction) ([place])    Polysubstance abuse (HCC)    cocaine, marijuana, ETOH   PUD (peptic ulcer disease)    S/P angioplasty with stent 06/10/2016   a.) 90% stenosis of pLAD to mLAD - 2.5 x 18 mm Xience Alpine (DES x 1) placed to pLAD   S/P PTCA (percutaneous transluminal coronary angioplasty) 12/04/2019   a.) 60% in stent restenosis of DES to pLAD; LVEF 65%.   Schizophrenia ([place])    Stroke ([place])  Valvular insufficiency    a.) Mild MR, TR, PR; mild to moderate AR on 03/05/2018 TTE    Past Surgical History:  Procedure Laterality Date   ABDOMINAL SURGERY     removed small piece of intestines due to [facility] Diverticulosis   APPENDECTOMY     BACK SURGERY     CARDIAC CATHETERIZATION Left 06/10/2016   Procedure: Left Heart Cath and Coronary Angiography;  Surgeon: Dionisio David, MD;  Location: [place] [facility];  Service: Cardiovascular;  Laterality:  Left;   CARDIAC CATHETERIZATION N/A 06/10/2016   Procedure: Coronary Stent Intervention;  Surgeon: Yolonda Kida, MD;  Location: [place] [facility];  Service: Cardiovascular;  Laterality: N/A;   CHOLECYSTECTOMY N/A 02/17/2022   Procedure: LAPAROSCOPIC CHOLECYSTECTOMY;  Surgeon: Kieth Brightly Arta Bruce, MD;  Location: [place];  Service: General;  Laterality: N/A;   COLON SURGERY     COLONOSCOPY     COLONOSCOPY WITH PROPOFOL N/A 01/05/2017   Procedure: COLONOSCOPY WITH PROPOFOL;  Surgeon: Jonathon Bellows, MD;  Location[facility] ENDOSCOPY;  Service: Endoscopy;  Laterality: N/A;   COLONOSCOPY WITH PROPOFOL N/A 02/13/2020   Procedure: COLONOSCOPY WITH PROPOFOL;  Surgeon: Virgel Manifold, MD;  Location: [facility] ENDOSCOPY;  Service: Endoscopy;  Laterality: N/A;   CORONARY ANGIOPLASTY WITH STENT PLACEMENT     ESOPHAGOGASTRODUODENOSCOPY (EGD) WITH PROPOFOL N/A 01/05/2017   Procedure: ESOPHAGOGASTRODUODENOSCOPY (EGD) WITH PROPOFOL;  Surgeon: Jonathon Bellows, MD;  Location: [facility] ENDOSCOPY;  Service: Endoscopy;  Laterality: N/A;   ESOPHAGOGASTRODUODENOSCOPY (EGD) WITH PROPOFOL N/A 02/13/2020   Procedure: ESOPHAGOGASTRODUODENOSCOPY (EGD) WITH PROPOFOL;  Surgeon: Virgel Manifold, MD;  Location: [facility] ENDOSCOPY;  Service: Endoscopy;  Laterality: N/A;   INTRAVASCULAR PRESSURE WIRE/FFR STUDY N/A 12/04/2019   Procedure: INTRAVASCULAR PRESSURE WIRE/FFR STUDY;  Surgeon: Sherren Mocha, MD;  Location: [place] [facility];  Service: Cardiovascular;  Laterality: N/A;   KNEE ARTHROSCOPY WITH MEDIAL MENISECTOMY Right 09/05/2017   Procedure: KNEE ARTHROSCOPY WITH MEDIAL AND LATERAL  MENISECTOMY PARTIAL SYNOVECTOMY;  Surgeon: Hessie Knows, MD;  Location: [facility] ORS;  Service: Orthopedics;  Laterality: Right;   LEFT HEART CATH AND CORONARY ANGIOGRAPHY N/A 12/04/2019   Procedure: LEFT HEART CATH AND CORONARY ANGIOGRAPHY;  Surgeon: Sherren Mocha, MD;  Location: [place] [facility];  Service: Cardiovascular;  Laterality: N/A;    SHOULDER SURGERY Right 04/09/2012   SPINE SURGERY     TOTAL KNEE ARTHROPLASTY Right 01/19/2021   Procedure: TOTAL KNEE ARTHROPLASTY - Rachelle Hora to Assist;  Surgeon: Hessie Knows, MD;  Location: [facility] ORS;  Service: Orthopedics;  Laterality: Right;   Family History:  Family History  Problem Relation Age of Onset   Osteoarthritis Mother    Heart disease Mother    Hypertension Mother    Depression Mother    Heart disease Father    Early death Father    Hypertension Father    Heart attack Father    Hypertension Sister    Prostate cancer Neg Hx    Bladder Cancer Neg Hx    Kidney cancer Neg Hx    Family Psychiatric  History: none Social History:  Social History   Substance and Sexual Activity  Alcohol Use Yes   Alcohol/week: 6.0 standard drinks of alcohol   Types: 6 Cans of beer per week   Comment: occassionally      Social History   Substance and Sexual Activity  Drug Use Not Currently   Types: Cocaine, Marijuana   Comment: last on saturday    Social History   Socioeconomic History  Marital status: Widowed    Spouse name: Not on file   Number of children: Not on file   Years of education: Not on file   Highest education level: Not on file  Occupational History   Occupation: disability   Occupation: stocking at gas station    Comment: 2 days per week  Tobacco Use   Smoking status: Some Days    Packs/day: 0.50    Years: 32.00    Total pack years: 16.00    Types: Cigarettes    Last attempt to quit: 10/25/2021    Years since quitting: 0.7   Smokeless tobacco: Never   Tobacco comments:    Continues to smoke approximately 5 cigarettes per day.  Has been 2 days since las cigarette.  Has not bought any more.  Using patches and gum.  5/23 hfb  Vaping Use   Vaping Use: Never used  Substance and Sexual Activity   Alcohol use: Yes    Alcohol/week: 6.0 standard drinks of alcohol    Types: 6 Cans of beer per week    Comment: occassionally    Drug use: Not Currently     Types: Cocaine, Marijuana    Comment: last on saturday   Sexual activity: Yes    Partners: Female    Birth control/protection: None  Other Topics Concern   Not on file  Social History Narrative   Not on file   Social Determinants of Health   Financial Resource Strain: Not on file  Food Insecurity: No Food Insecurity (08/05/2022)   Hunger Vital Sign    Worried About Running Out of Food in the Last Year: Never true    Ran Out of Food in the Last Year: Never true  Transportation Needs: No Transportation Needs (08/05/2022)   PRAPARE - Hydrologist (Medical): No    Lack of Transportation (Non-Medical): No  Physical Activity: Not on file  Stress: Not on file  Social Connections: Not on file    Current Medications: Current Facility-Administered Medications  Medication Dose Route Frequency Provider Last Rate Last Admin   albuterol (PROVENTIL) (2.5 MG/3ML) 0.083% nebulizer solution 3 mL  3 mL Inhalation Q6H PRN [name], MD       aspirin EC tablet 81 mg  81 mg Oral Daily [name], MD   81 mg at 08/07/22 1696   atorvastatin (LIPITOR) tablet 80 mg  80 mg Oral QHS [name], MD   80 mg at 08/07/22 2033   Budeson-Glycopyrrol-Formoterol 160-9-4.8 MCG/ACT AERO 2 puff  2 puff Inhalation BH-qamhs Massengill, Ovid Curd, MD   2 puff at 08/07/22 2032   busPIRone (BUSPAR) tablet 15 mg  15 mg Oral Daily [name], MD   15 mg at 08/07/22 7893   cyclobenzaprine (FLEXERIL) tablet 10 mg  10 mg Oral BID PRN [name], MD   10 mg at 08/08/22 0352   divalproex (DEPAKOTE) DR tablet 500 mg  500 mg Oral BID [name], MD   500 mg at 08/07/22 1806   docusate sodium (COLACE) capsule 100 mg  100 mg Oral Daily PRN [name], MD       DULoxetine (CYMBALTA) DR capsule 90 mg  90 mg Oral Daily Corky Sox, MD   90 mg at 08/07/22 0833   enalapril (VASOTEC) tablet 2.5 mg  2.5 mg Oral BID [name], MD   2.5 mg at 08/07/22 2034    fluticasone (FLONASE) 50 MCG/ACT nasal spray 1 spray  1 spray Each  Nare Daily [name], MD   1 spray at 75/64/33 2951   folic acid (FOLVITE) tablet 1 mg  1 mg Oral Daily [name], MD   1 mg at 08/07/22 0827   gabapentin (NEURONTIN) capsule 200 mg  200 mg Oral TID Corky Sox, MD   200 mg at 08/07/22 2035   hydrocortisone cream 1 %   Topical BID PRN Corky Sox, MD   Given at 08/07/22 1433   hydrOXYzine (ATARAX) tablet 25 mg  25 mg Oral TID PRN Corky Sox, MD       ibuprofen (ADVIL) tablet 600 mg  600 mg Oral Q6H PRN [name], MD   600 mg at 08/08/22 0353   loratadine (CLARITIN) tablet 10 mg  10 mg Oral Daily [name], MD   10 mg at 08/07/22 0828   OLANZapine zydis (ZYPREXA) disintegrating tablet 5 mg  5 mg Oral Q8H PRN Corky Sox, MD       And   LORazepam (ATIVAN) tablet 1 mg  1 mg Oral PRN Corky Sox, MD       And   ziprasidone (GEODON) injection 20 mg  20 mg Intramuscular PRN Corky Sox, MD       meloxicam [facility]) tablet 7.5 mg  7.5 mg Oral Daily [name], MD   7.5 mg at 08/07/22 8841   multivitamin with minerals tablet 1 tablet  1 tablet Oral Daily [name], MD   1 tablet at 08/07/22 0827   nicotine (NICODERM CQ - dosed in mg/24 hours) patch 21 mg  21 mg Transdermal Daily [name], MD   21 mg at 08/07/22 6606   Oral care mouth rinse  15 mL Mouth Rinse PRN [name], MD       pantoprazole (PROTONIX) EC tablet 20 mg  20 mg Oral Daily [name], MD   20 mg at 08/07/22 3016   rOPINIRole (REQUIP) tablet 1 mg  1 mg Oral QHS Corky Sox, MD   1 mg at 08/07/22 2036   tamsulosin (FLOMAX) capsule 0.4 mg  0.4 mg Oral Daily [name], MD   0.4 mg at 08/07/22 0109   thiamine (Vitamin B-1) tablet 100 mg  100 mg Oral Daily [name], MD   100 mg at 08/07/22 3235   traZODone (DESYREL) tablet 100 mg  100 mg Oral QHS Corky Sox, MD   100 mg at 08/07/22 2034    Lab Results:  Results for orders placed  or performed during the hospital encounter of 08/05/22 (from the past 48 hour(s))  Sedimentation rate     Status: None   Collection Time: 08/07/22  6:32 AM  Result Value Ref Range   Sed Rate 7 0 - 16 mm/hr    Comment: Performed at [facility], [place] [address], [place], [place] [address]  C-reactive protein     Status: Abnormal   Collection Time: 08/07/22  6:32 AM  Result Value Ref Range   CRP 1.0 (H) <1.0 mg/dL    Comment: Performed at [facility] [address] [place], [place] [address]    Blood Alcohol level:  Lab Results  Component Value Date   [facility] <10 08/03/2022   ETH <10 70/62/3762    Metabolic Disorder Labs: Lab Results  Component Value Date   HGBA1C 5.2 04/26/2022   MPG 102.54 04/26/2022   MPG 103 12/28/2016   No results found for: "PROLACTIN" Lab Results  Component Value Date   CHOL 83 04/26/2022  TRIG 52 04/26/2022   HDL 28 (L) 04/26/2022   CHOLHDL 3.0 04/26/2022   VLDL 10 04/26/2022   LDLCALC 45 04/26/2022   LDLCALC 41 08/31/2021   Psychiatric Specialty Exam: General Appearance: appears at stated age, casually dressed and groomed   Behavior: pleasant and cooperative   Psychomotor Activity: no psychomotor agitation or retardation noted   Eye Contact: fair  Speech: normal amount, tone, volume and fluency    Mood: dysthymic  Affect: congruent  Thought Process: linear, goal directed, no circumstantial or tangential thought process noted, no racing thoughts or flight of ideas  Descriptions of Associations: intact   Thought Content Hallucinations: denies AH, VH , does not appear responding to stimuli  Delusions: no paranoia, delusions of control, grandeur, ideas of reference, thought broadcasting, and magical thinking Suicidal Thoughts: denies SI, intention, plan  Homicidal Thoughts: denies HI, intention, plan   Alertness/Orientation: alert and fully oriented   Insight: fair Judgment: fair  Memory: intact    Executive Functions  Concentration: intact  Attention Span: fair  Recall: intact  Fund of Knowledge: fair   Physical Exam  Constitutional:      Appearance: Normal appearance.  Cardiovascular:     Rate and Rhythm: Normal rate.  Pulmonary:     Effort: Pulmonary effort is normal.  Neurological:     General: No focal deficit present.     Mental Status: Alert and oriented to person, place, and time.    Review of Systems  Constitutional: Negative.  Negative for chills, fever and weight loss.  HENT: Negative.    Eyes: Negative.   Respiratory: Negative.    Cardiovascular: Negative.   Gastrointestinal:  Negative for constipation, diarrhea, nausea and vomiting.  Genitourinary: Negative.   Musculoskeletal: Negative.   Skin: Negative.   Neurological: Negative.  Negative for tingling.     Treatment Plan Summary: Daily contact with patient to assess and evaluate symptoms and progress in treatment and Medication management  ASSESSMENT:   Diagnoses / Active Problems: Substance-induced mood disorder Cocaine use disorder Intermittent explosive disorder   PLAN: Safety and Monitoring:             -- Voluntary admission to inpatient psychiatric unit for safety, stabilization and treatment             -- Daily contact with patient to assess and evaluate symptoms and progress in treatment             -- Patient's case to be discussed in multi-disciplinary team meeting             -- Observation Level : q15 minute checks             -- Vital signs:  q12 hours             -- Precautions: suicide, elopement, and assault   2. Psychiatric Diagnoses and Treatment:  - Continue home Cymbalta at 90 mg for depression, increased from home dose of 60 mg - Continue Depakote at 500 mg twice daily (dose increased at medical hospital) - VPA level 1/16, ordered as trough level - Do not restart home Zyprexa given overdose - Replace with trazodone 100 mg nightly for sleep - Continue Requip 1 mg  (reduced from 2 mg) nightly.  The medication is used to treat his restless leg syndrome.  Feel this is a concerning medication for the patient to be on given his anger issues and impulsivity.  Dopamine agonists have certainly been shown to increase impulsivity in Parkinson's.  We will reassess the need for returning to his home dose if his restless leg syndrome gets worse. --  The risks/benefits/side-effects/alternatives to this medication were discussed in detail with the patient and time was given for questions. The patient consents to medication trial.             -- Encouraged patient to participate in unit milieu and in scheduled group therapies              -- Short Term Goals: Ability to verbalize feelings will improve             -- Long Term Goals: Improvement in symptoms so as ready for discharge                3. Medical Issues Being Addressed:              Tobacco Use Disorder             -- Nicotine patch '21mg'$ /24 hours ordered             -- Smoking cessation encouraged - Increase Gabapentin to 300 mg TID for chronic pain - Patient denies opioid abuse, UDS negative for opioids   4. Discharge Planning:              -- Social work and case management to assist with discharge planning and identification of hospital follow-up needs prior to discharge             -- Estimated LOS: 5-7 days             -- Discharge Concerns: Need to establish a safety plan; Medication compliance and effectiveness             -- Discharge Goals: Return home with outpatient referrals for mental health follow-up including medication management/psychotherapy - Patient would benefit from pain management specialist, who will not prescribe the patient opioids. Previously was on Suboxone and his mom reports he did well. - Patient has declined residential rehab, but is interested in outpatient options - Recommend CD IOP with Malka So in [place],  MD 08/08/2022, 6:59 AM

## 2022-08-08 NOTE — BH IP Treatment Plan (Signed)
Interdisciplinary Treatment and Diagnostic Plan Update  08/08/2022 Time of Session: 9:55am Keith Gibbs MRN: [id number]  Principal Diagnosis: Substance induced mood disorder ([place])  Secondary Diagnoses: Principal Problem:   Substance induced mood disorder ([place]) Active Problems:   Chronic pain syndrome   Cocaine use   Angina at rest   CAD S/P percutaneous coronary angioplasty   Suicide attempt by drug overdose ([employer])   Current Medications:  Current Facility-Administered Medications  Medication Dose Route Frequency Provider Last Rate Last Admin   albuterol (PROVENTIL) (2.5 MG/3ML) 0.083% nebulizer solution 3 mL  3 mL Inhalation Q6H PRN [name], MD       aspirin EC tablet 81 mg  81 mg Oral Daily [name], MD   81 mg at 08/08/22 7673   atorvastatin (LIPITOR) tablet 80 mg  80 mg Oral QHS [name], MD   80 mg at 08/07/22 2033   Budeson-Glycopyrrol-Formoterol 160-9-4.8 MCG/ACT AERO 2 puff  2 puff Inhalation BH-qamhs Massengill, Ovid Curd, MD   2 puff at 08/08/22 0818   busPIRone (BUSPAR) tablet 15 mg  15 mg Oral Daily [name], MD   15 mg at 08/08/22 0816   cyclobenzaprine (FLEXERIL) tablet 10 mg  10 mg Oral BID PRN [name], MD   10 mg at 08/08/22 0352   divalproex (DEPAKOTE) DR tablet 500 mg  500 mg Oral BID [name], MD   500 mg at 08/08/22 0816   docusate sodium (COLACE) capsule 100 mg  100 mg Oral Daily PRN [name], MD       DULoxetine (CYMBALTA) DR capsule 90 mg  90 mg Oral Daily Corky Sox, MD   90 mg at 08/08/22 0816   enalapril (VASOTEC) tablet 2.5 mg  2.5 mg Oral BID [name], MD   2.5 mg at 08/08/22 0822   fluticasone (FLONASE) 50 MCG/ACT nasal spray 1 spray  1 spray Each Nare Daily [name], MD   1 spray at 41/93/79 0240   folic acid (FOLVITE) tablet 1 mg  1 mg Oral Daily [name], MD   1 mg at 08/08/22 0817   gabapentin (NEURONTIN) capsule 300 mg  300 mg Oral TID Coralyn Pear B, MD       hydrocortisone  cream 1 %   Topical BID PRN Corky Sox, MD   Given at 08/07/22 1433   hydrOXYzine (ATARAX) tablet 25 mg  25 mg Oral TID PRN Corky Sox, MD       ibuprofen (ADVIL) tablet 600 mg  600 mg Oral Q6H PRN [name], MD   600 mg at 08/08/22 0353   loratadine (CLARITIN) tablet 10 mg  10 mg Oral Daily [name], MD   10 mg at 08/08/22 0817   OLANZapine zydis (ZYPREXA) disintegrating tablet 5 mg  5 mg Oral Q8H PRN Corky Sox, MD       And   LORazepam (ATIVAN) tablet 1 mg  1 mg Oral PRN Corky Sox, MD       And   ziprasidone (GEODON) injection 20 mg  20 mg Intramuscular PRN Corky Sox, MD       meloxicam [facility]) tablet 7.5 mg  7.5 mg Oral Daily [name], MD   7.5 mg at 08/08/22 9735   multivitamin with minerals tablet 1 tablet  1 tablet Oral Daily [name], MD   1 tablet at 08/08/22 0815   nicotine (NICODERM CQ - dosed in mg/24 hours) patch 21 mg  21 mg Transdermal Daily [name], MD  21 mg at 08/08/22 0817   Oral care mouth rinse  15 mL Mouth Rinse PRN [name], MD       pantoprazole (PROTONIX) EC tablet 20 mg  20 mg Oral Daily [name], MD   20 mg at 08/08/22 0817   rOPINIRole (REQUIP) tablet 1 mg  1 mg Oral QHS Corky Sox, MD   1 mg at 08/07/22 2036   tamsulosin (FLOMAX) capsule 0.4 mg  0.4 mg Oral Daily [name], MD   0.4 mg at 08/08/22 0370   thiamine (Vitamin B-1) tablet 100 mg  100 mg Oral Daily [name], MD   100 mg at 08/08/22 4888   traZODone (DESYREL) tablet 100 mg  100 mg Oral QHS Corky Sox, MD   100 mg at 08/07/22 2034   PTA Medications: Medications Prior to Admission  Medication Sig Dispense Refill Last Dose   albuterol (VENTOLIN HFA) 108 (90 Base) MCG/ACT inhaler Inhale 2 puffs into the lungs every 6 (six) hours as needed for wheezing or shortness of breath. 18 g 6    aspirin EC 81 MG tablet Take 1 tablet (81 mg total) by mouth daily. Swallow whole. 30 tablet 12    atorvastatin (LIPITOR) 80 MG  tablet Take 1 tablet (80 mg total) by mouth at bedtime. 30 tablet 0    Budeson-Glycopyrrol-Formoterol (BREZTRI AEROSPHERE) 160-9-4.8 MCG/ACT AERO Inhale 2 puffs into the lungs in the morning and at bedtime.      busPIRone (BUSPAR) 15 MG tablet Take 1 tablet (15 mg total) by mouth daily. 60 tablet 3    cyclobenzaprine (FLEXERIL) 10 MG tablet Take 1 tablet (10 mg total) by mouth 2 (two) times daily as needed for muscle spasms. 20 tablet 0    diclofenac Sodium (VOLTAREN) 1 % GEL Apply 2 g topically 4 (four) times daily. 2 g 0    divalproex (DEPAKOTE) 500 MG DR tablet Take 1 tablet (500 mg total) by mouth 2 (two) times daily. 60 tablet 3    docusate sodium (COLACE) 100 MG capsule Take 1 capsule (100 mg total) by mouth daily as needed for mild constipation. (Patient not taking: Reported on 08/03/2022) 10 capsule 0    DULoxetine (CYMBALTA) 60 MG capsule Take 1 capsule (60 mg total) by mouth daily. (Patient not taking: Reported on 08/03/2022) 30 capsule 0    enalapril (VASOTEC) 2.5 MG tablet Take 1 tablet (2.5 mg total) by mouth 2 (two) times daily. (Patient not taking: Reported on 08/03/2022) 60 tablet 0    fluticasone (FLONASE) 50 MCG/ACT nasal spray Place 1 spray into both nostrils at bedtime. (Patient not taking: Reported on 08/03/2022) 9.9 mL 0    gabapentin (NEURONTIN) 300 MG capsule Take 300 mg by mouth 3 (three) times daily.      loratadine (CLARITIN) 10 MG tablet Take 1 tablet (10 mg total) by mouth daily. 30 tablet 0    Multiple Vitamin (MULTIVITAMIN WITH MINERALS) TABS tablet Take 1 tablet by mouth daily. 30 tablet 0    nitroGLYCERIN (NITROSTAT) 0.3 MG SL tablet Place 1 tablet (0.3 mg total) under the tongue every 5 (five) minutes as needed for chest pain. Maximum of 3 doses daily. 90 tablet 0    pantoprazole (PROTONIX) 20 MG tablet Take 1 tablet (20 mg total) by mouth daily for 15 days. (Patient not taking: Reported on 08/03/2022) 15 tablet 0    rOPINIRole (REQUIP) 2 MG tablet Take 1 tablet (2 mg  total) by mouth at bedtime. TAKE 1 TABLET BY MOUTH  EVERYDAY AT BEDTIME Strength: 2 mg (Patient taking differently: Take 2 mg by mouth at bedtime.) 30 tablet 0    tamsulosin (FLOMAX) 0.4 MG CAPS capsule Take 1 capsule (0.4 mg total) by mouth daily. 30 capsule 0    thiamine (VITAMIN B-1) 100 MG tablet Take 1 tablet (100 mg total) by mouth daily. (Patient not taking: Reported on 08/03/2022) 30 tablet 0     Patient Stressors: Health problems   Medication change or noncompliance   Substance abuse    Patient Strengths: Marketing executive fund of knowledge  Religious Affiliation   Treatment Modalities: Medication Management, Group therapy, Case management,  1 to 1 session with clinician, Psychoeducation, Recreational therapy.   Physician Treatment Plan for Primary Diagnosis: Substance induced mood disorder ([place]) Long Term Goal(s): Improvement in symptoms so as ready for discharge   Short Term Goals: Ability to verbalize feelings will improve Ability to identify and develop effective coping behaviors will improve Ability to disclose and discuss suicidal ideas  Medication Management: Evaluate patient's response, side effects, and tolerance of medication regimen.  Therapeutic Interventions: 1 to 1 sessions, Unit Group sessions and Medication administration.  Evaluation of Outcomes: Progressing  Physician Treatment Plan for Secondary Diagnosis: Principal Problem:   Substance induced mood disorder ([facility]) Active Problems:   Chronic pain syndrome   Cocaine use   Angina at rest   CAD S/P percutaneous coronary angioplasty   Suicide attempt by drug overdose ([place])  Long Term Goal(s): Improvement in symptoms so as ready for discharge   Short Term Goals: Ability to verbalize feelings will improve Ability to identify and develop effective coping behaviors will improve Ability to disclose and discuss suicidal ideas     Medication Management: Evaluate patient's response, side effects,  and tolerance of medication regimen.  Therapeutic Interventions: 1 to 1 sessions, Unit Group sessions and Medication administration.  Evaluation of Outcomes: Progressing   RN Treatment Plan for Primary Diagnosis: Substance induced mood disorder ([place]) Long Term Goal(s): Knowledge of disease and therapeutic regimen to maintain health will improve  Short Term Goals: Ability to remain free from injury will improve, Ability to verbalize frustration and anger appropriately will improve, Ability to demonstrate self-control, Ability to participate in decision making will improve, Ability to verbalize feelings will improve, Ability to disclose and discuss suicidal ideas, Ability to identify and develop effective coping behaviors will improve, and Compliance with prescribed medications will improve  Medication Management: RN will administer medications as ordered by provider, will assess and evaluate patient's response and provide education to patient for prescribed medication. RN will report any adverse and/or side effects to prescribing provider.  Therapeutic Interventions: 1 on 1 counseling sessions, Psychoeducation, Medication administration, Evaluate responses to treatment, Monitor vital signs and CBGs as ordered, Perform/monitor CIWA, COWS, AIMS and Fall Risk screenings as ordered, Perform wound care treatments as ordered.  Evaluation of Outcomes: Progressing   LCSW Treatment Plan for Primary Diagnosis: Substance induced mood disorder ([place]) Long Term Goal(s): Safe transition to appropriate next level of care at discharge, Engage patient in therapeutic group addressing interpersonal concerns.  Short Term Goals: Engage patient in aftercare planning with referrals and resources, Increase social support, Increase ability to appropriately verbalize feelings, Increase emotional regulation, Facilitate acceptance of mental health diagnosis and concerns, Facilitate patient progression through stages of  change regarding substance use diagnoses and concerns, Identify triggers associated with mental health/substance abuse issues, and Increase skills for wellness and recovery  Therapeutic Interventions: Assess for all discharge needs, 1 to  1 time with Education officer, museum, Explore available resources and support systems, Assess for adequacy in community support network, Educate family and significant other(s) on suicide prevention, Complete Psychosocial Assessment, Interpersonal group therapy.  Evaluation of Outcomes: Progressing   Progress in Treatment: Attending groups: Yes. Participating in groups: Yes. Taking medication as prescribed: Yes. Toleration medication: Yes. Family/Significant other contact made: Yes, individual(s) contacted:   Bhavik Cabiness [phone] (H) or [phone] (C) Patient understands diagnosis: Yes. Discussing patient identified problems/goals with staff: Yes. Medical problems stabilized or resolved: Yes. Denies suicidal/homicidal ideation: Yes. Issues/concerns per patient self-inventory: No.   New problem(s) identified: No, Describe:  none reported   New Short Term/Long Term Goal(s):   medication stabilization, elimination of SI thoughts, development of comprehensive mental wellness plan.    Patient Goals:  Pt states, "I want to work on my temper and my stress level"  Discharge Plan or Barriers: Patient recently admitted. CSW will continue to follow and assess for appropriate referrals and possible discharge planning.    Reason for Continuation of Hospitalization: Anxiety Depression Medication stabilization  Estimated Length of Stay: 1-3 days  Last 3 [place] Suicide Severity Risk Score: [place] Admission (Current) from 08/05/2022 in [facility] ED to Hosp-Admission (Discharged) from 08/03/2022 in [facility] Video Visit from 08/01/2022 in [place] Risk Error: Q3, 4, or 5 should not be populated when Q2 is No Error: Q7 should not be populated when Q6 is No       Last PHQ 2/9 Scores:    08/01/2022    8:52 AM 05/02/2022    4:19 PM 04/30/2022    4:18 PM  Depression screen PHQ 2/9  Decreased Interest 3 0 1  Down, Depressed, Hopeless '3 1 2  '$ PHQ - 2 Score '6 1 3  '$ Altered sleeping '3 1 3  '$ Tired, decreased energy 0 1 2  Change in appetite 1 0 2  Feeling bad or failure about yourself  3 0 1  Trouble concentrating 3 0 1  Moving slowly or fidgety/restless 3 0 1  Suicidal thoughts 3 0 0  PHQ-9 Score '22 3 13  '$ Difficult doing work/chores Very difficult Somewhat difficult Somewhat difficult    Scribe for Treatment Team: Zachery Conch, LCSW 08/08/2022 10:59 AM
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
## 2022-08-08 NOTE — BHH Suicide Risk Assessment (Addendum)
[place] INPATIENT:  Family/Significant Other Suicide Prevention Education  Suicide Prevention Education:  Education Completed; Keith Gibbs [phone] (Mother, home phone number) has been identified by the patient as the family member/significant other with whom the patient will be residing, and identified as the person(s) who will aid the patient in the event of a mental health crisis (suicidal ideations/suicide attempt).  With written consent from the patient, the family member/significant other has been provided the following suicide prevention education, prior to the and/or following the discharge of the patient.  The suicide prevention education provided includes the following: Suicide risk factors Suicide prevention and interventions National Suicide Hotline telephone number [facility] assessment telephone number [facility] Emergency Assistance [place] and/or Residential Mobile Crisis Unit telephone number  Request made of family/significant other to: Remove weapons (e.g., guns, rifles, knives), all items previously/currently identified as safety concern.   Remove drugs/medications (over-the-counter, prescriptions, illicit drugs), all items previously/currently identified as a safety concern.  The family member/significant other verbalizes understanding of the suicide prevention education information provided.  The family member/significant other agrees to remove the items of safety concern listed above.  CSW spoke with Keith Gibbs who confirms that her son does live with her and can return to the home after discharge.  She states that there are no firearms or weapons in the home.  She also confirms that her son has a pre-operative appointment on 08/11/2022 and has surgery for his back scheduled on 08/18/2022.  Keith Gibbs states that she believes her son has been doing better since being admitted to the hospital.  She states that when her son returns home she will  "take him to all of his appointments and will manage his medications for him".  CSW completed SPE with Keith Gibbs.   Keith Gibbs 08/08/2022, 10:55 AM

## 2022-08-08 NOTE — Group Note (Addendum)
Recreation Therapy Group Note   Group Topic:Team Building  Group Date: 08/08/2022 Start Time: 0936 End Time: 0951 Facilitators: Katelynne Revak-McCall, LRT,CTRS Location: [address] Dayroom   Goal Area(s) Addresses:  Patient will effectively work with peer towards shared goal.  Patient will identify skills used to make activity successful.  Patient will identify how skills used during activity can be applied to reach post d/c goals.    Group Description: [employer]. In teams of 5-6, patients were given 11 craft pipe cleaners. Using the materials provided, patients were instructed to compete again the opposing team(s) to build the tallest free-standing structure from floor level. The activity was timed; difficulty increased by Probation officer as Pharmacist, hospital continued.  Systematically resources were removed with additional directions for example, placing one arm behind their back, working in silence, and shape stipulations. LRT facilitated post-activity discussion reviewing team processes and necessary communication skills involved in completion. Patients were encouraged to reflect how the skills utilized, or not utilized, in this activity can be incorporated to positively impact support systems post discharge.   Affect/Mood: Appropriate   Participation Level: Engaged   Participation Quality: Independent   Behavior: Appropriate   Speech/Thought Process: Focused   Insight: Good   Judgement: Good   Modes of Intervention: STEM Activity   Patient Response to Interventions:  Engaged   Education Outcome:  Acknowledges education and In group clarification offered    Clinical Observations/Individualized Feedback: Pt came in late to group but joined right in with the activity.  Pt expressed they used communication as one of the skills to complete the activity.  Pt also shared the importance of having positive people around you to help navigate through situations.       Plan: Continue to engage patient in RT group sessions 2-3x/week.   Keith Gibbs, LRT,CTRS 08/08/2022 1:03 PM

## 2022-08-08 NOTE — [facility] Group Notes (Signed)
Pt attended [place] group

## 2022-08-08 NOTE — Progress Notes (Signed)
Chaplain received a consult that Kayshawn was requesting prayer.  Chaplain had first met Cincere in the grief and loss group and had learned of the losses of his wife and youngest daughter and his determination to get his life back on track.  He was requesting prayer for guidance as well as gratitude that his suicide attempt did not end his life.  Chaplain provided prayer as well as reflective listening.    [address], [place] Pager, [phone]

## 2022-08-08 NOTE — Progress Notes (Signed)
   08/08/22 0741  Psych Admission Type (Psych Patients Only)  Admission Status Voluntary  Psychosocial Assessment  Patient Complaints Anxiety  Eye Contact Fair  Facial Expression Anxious  Affect Anxious  Speech Logical/coherent  Interaction Assertive  Motor Activity Fidgety  Appearance/Hygiene Improved  Behavior Characteristics Calm;Cooperative  Mood Pleasant  Thought Process  Coherency WDL  Content WDL  Delusions None reported or observed  Perception WDL  Hallucination None reported or observed  Judgment Limited  Confusion None  Danger to Self  Current suicidal ideation? Denies  Agreement Not to Harm Self Yes  Description of Agreement verbal  Danger to Others  Danger to Others None reported or observed

## 2022-08-08 NOTE — Plan of Care (Signed)
  Problem: Education: Goal: Knowledge of [facility] Education information/materials will improve Outcome: Progressing Goal: Emotional status will improve Outcome: Progressing Goal: Mental status will improve Outcome: Progressing Goal: Verbalization of understanding the information provided will improve Outcome: Progressing   Problem: Activity: Goal: Interest or engagement in activities will improve Outcome: Progressing   Problem: Activity: Goal: Interest or engagement in activities will improve Outcome: Progressing Goal: Sleeping patterns will improve Outcome: Progressing   Problem: Coping: Goal: Ability to verbalize frustrations and anger appropriately will improve Outcome: Progressing   Problem: Coping: Goal: Ability to verbalize frustrations and anger appropriately will improve Outcome: Progressing Goal: Ability to demonstrate self-control will improve Outcome: Progressing

## 2022-08-08 NOTE — Group Note (Signed)
Date:  08/08/2022 Time:  1:12 PM  Group Topic/Focus:  Orientation:   The focus of this group is to educate the patient on the purpose and policies of crisis stabilization and provide a format to answer questions about their admission.  The group details unit policies and expectations of patients while admitted.    Participation Level:  Active  Participation Quality:  Appropriate  Affect:  Appropriate  Cognitive:  Appropriate  Insight: Appropriate  Engagement in Group:  Engaged  Modes of Intervention:  Discussion  Additional Comments:     Jerrye Beavers 08/08/2022, 1:12 PM

## 2022-08-09 LAB — RHEUMATOID FACTOR: Rheumatoid fact SerPl-aCnc: 10.1 IU/mL (ref <14.0)

## 2022-08-09 LAB — VALPROIC ACID LEVEL: Valproic Acid Lvl: 27 ug/mL — ABNORMAL LOW (ref 50.0–100.0)

## 2022-08-09 LAB — CYCLIC CITRUL PEPTIDE ANTIBODY, IGG/IGA: CCP Antibodies IgG/IgA: 7 units (ref 0–19)

## 2022-08-09 NOTE — Progress Notes (Signed)
Patient rated his day as either a 7 or 8 out of 10. He admits to being nervous about his upcoming surgery. His positive event for the day is that he will be discharged tomorrow and is grateful for his peers.

## 2022-08-09 NOTE — Group Note (Signed)
LCSW Group Therapy Note   Group Date: 08/09/2022 Start Time: 1100 End Time: 1200  Type of Therapy: Group Therapy: Boundaries  Description of Group: This group will address the use of boundaries in their personal lives. Patients will explore why boundaries are important, the difference between healthy and unhealthy boundaries, and negative and positive outcomes of different boundaries and will look at how boundaries can be crossed.  Patients will be encouraged to identify current boundaries in their own lives and identify what kind of boundary is being set. Facilitators will guide patients in utilizing problem-solving interventions to address and correct types of boundaries being used and to address when no boundary is being used. Understanding and applying boundaries will be explored and addressed for obtaining and maintaining a balanced life. Patients will be encouraged to explore ways to assertively make their boundaries and needs known to significant others in their lives, using other group members and facilitator for role play, support, and feedback.   Therapeutic Goals: 1. Patient will identify areas in their life where setting clear boundaries could be used to improve their life.  2. Patient will identify signs/triggers that a boundary is not being respected. 3. Patient will identify two ways to set boundaries in order to achieve balance in their lives: 4. Patient will demonstrate ability to communicate their needs and set boundaries through discussion and/or role plays  Participation Level: Active  Participation Quality: Appropriate and Sharing  Affect: Anxious  Cognitive: Alert  Insight: Appropriate  Engagement in Therapy: Developing/Improving  Modes of Intervention: Activity, Discussion, Education, and Exploration  Summary of Progress/Problems: The Pt attended group and remained there the entire time.  The Pt was appropriate with peers and staff.  The Pt accepted all  worksheets and materials that were provided.  The Pt was able to demonstrate understanding and remain on topic throughout the discussion.   Darleen Crocker, LCSWA 08/09/2022  1:21 PM

## 2022-08-09 NOTE — Group Note (Signed)
Recreation Therapy Group Note   Group Topic:Animal Assisted Therapy   Group Date: 08/09/2022 Start Time: 1430 End Time: 1510 Facilitators: Kalesha Irving-McCall, LRT,CTRS Location: [address] Dayroom   Animal-Assisted Activity (AAA) Program Checklist/Progress Notes Patient Eligibility Criteria Checklist & Daily Group note for Rec Tx Intervention  AAA/T Program Assumption of Risk Form signed by Patient/ or Parent Legal Guardian Yes  Patient is free of allergies or severe asthma Yes  Patient reports no fear of animals Yes  Patient reports no history of cruelty to animals Yes  Patient understands his/her participation is voluntary Yes  Patient washes hands before animal contact Yes  Patient washes hands after animal contact Yes   Affect/Mood: Appropriate   Participation Level: Engaged   Participation Quality: Independent   Behavior: Appropriate    Clinical Observations/Individualized Feedback:   Patient attended session and interacted appropriately with therapy dog and peers. Patient asked appropriate questions about therapy dog and his training. Patient shared stories about their pets at home with group.    Plan: Continue to engage patient in RT group sessions 2-3x/week.   Seymour Pavlak-McCall, LRT,CTRS 08/09/2022 4:14 PM

## 2022-08-09 NOTE — Progress Notes (Signed)
   08/08/22 2126  Psych Admission Type (Psych Patients Only)  Admission Status Voluntary  Psychosocial Assessment  Patient Complaints Anxiety  Eye Contact Fair  Facial Expression Anxious  Affect Apathetic  Speech Logical/coherent  Interaction Assertive  Motor Activity Slow  Appearance/Hygiene Unremarkable  Behavior Characteristics Cooperative;Appropriate to situation  Mood Pleasant  Thought Process  Coherency WDL  Content WDL  Delusions None reported or observed  Perception WDL  Hallucination None reported or observed  Judgment Limited  Confusion None  Danger to Self  Current suicidal ideation? Denies  Agreement Not to Harm Self Yes  Description of Agreement Verbal  Danger to Others  Danger to Others None reported or observed

## 2022-08-09 NOTE — Progress Notes (Signed)
   08/09/22 0545  15 Minute Checks  Location Bedroom  Visual Appearance Calm  Behavior Composed  Sleep (Behavioral Health Patients Only)  Calculate sleep? (Click Yes once per 24 hr at 0600 safety check) Yes  Documented sleep last 24 hours 7.5

## 2022-08-09 NOTE — Progress Notes (Signed)
   08/09/22 0800  Psych Admission Type (Psych Patients Only)  Admission Status Voluntary  Psychosocial Assessment  Patient Complaints Anxiety  Eye Contact Fair  Facial Expression Anxious  Affect Apathetic  Speech Logical/coherent  Interaction Assertive  Motor Activity Slow  Appearance/Hygiene Unremarkable  Behavior Characteristics Cooperative;Appropriate to situation  Mood Pleasant  Thought Process  Coherency WDL  Content WDL  Delusions None reported or observed  Perception WDL  Hallucination None reported or observed  Judgment Limited  Confusion None  Danger to Self  Current suicidal ideation? Denies  Agreement Not to Harm Self Yes  Description of Agreement verbal  Danger to Others  Danger to Others None reported or observed

## 2022-08-09 NOTE — Plan of Care (Signed)
  Problem: Education: Goal: Emotional status will improve Outcome: Progressing Goal: Mental status will improve Outcome: Progressing   Problem: Activity: Goal: Sleeping patterns will improve Outcome: Progressing   Problem: Health Behavior/Discharge Planning: Goal: Identification of resources available to assist in meeting health care needs will improve Outcome: Progressing   Problem: Education: Goal: Knowledge of the prescribed therapeutic regimen will improve Outcome: Progressing

## 2022-08-09 NOTE — Plan of Care (Signed)
  Problem: Coping: Goal: Ability to verbalize frustrations and anger appropriately will improve Outcome: Progressing Goal: Ability to demonstrate self-control will improve Outcome: Progressing   Problem: Safety: Goal: Periods of time without injury will increase Outcome: Progressing

## 2022-08-09 NOTE — Progress Notes (Signed)
[facility] MD Progress Note  08/09/2022 7:03 AM Keith Gibbs  MRN:  [id number] Subjective:   Keith Gibbs is a 62 year old male with a history of numerous substance use disorders (opioids, cocaine, alcohol), reported schizoaffective disorder-bipolar type, familiar to this provider from an October stay in the [facility]. He has a medical history of CAD w unstable angina, s/p PCI, and chronic pain. He was medically admitted for several days after an overdose on home Zyprexa. He was admitted voluntarily to inpatient psychiatry. After initial evaluation, feel that the patient's substance use is predominant in his psychiatric symptoms. Diagnosis is substance induced mood disorder in context of frequent cocaine use. Feel his mood will improve greatly with abstinence from drugs.  Patient also appears to have intermittent explosive disorder, currently managed with Depakote.  Feel strongly that the patient does not have schizoaffective disorder-bipolar type.  Patient describes previous "manic" episodes as panic attacks, and given his near constant substance use we cannot diagnose him with a manic episode.  Patient desires outpatient follow-up, so that he can attend his surgery on the 25th of this month.   The patient was seen and evaluated on the unit . On assessment, the patient feels "good" today. Patient reports having poor sleep due to his pain and good appetite with no issues on bowel movement, nausea, vomiting, and abdominal pain. He feels that the medications have been helpful regarding his depression. Patient feels the group session have been helpful,  and he intends to continue going to therapy and seeing an OP psychiatrist. When asked about low mood, patient reports it has been improving.  Denies SI, HI, AVH, delusions, paranoia, thought broadcasting, and ideas of reference.   24 hour chart review The patient's chart was reviewed and nursing notes were reviewed. The patient's case was discussed in  multidisciplinary team meeting. Per [facility], patient was taking medications appropriately. Patient seen by chaplain. Per nursing, patient is calm and cooperative and attended 3 group sessions. Vitals show orthostatic hypotension. The following as needed medications were given: 1 albuterol, flexeril 2x, atarax 1x, advil 3x    Principal Problem: Substance induced mood disorder ([place]) Diagnosis: Principal Problem:   Substance induced mood disorder (HCC) Active Problems:   Chronic pain syndrome   Cocaine use   Angina at rest   CAD S/P percutaneous coronary angioplasty   Suicide attempt by drug overdose ([place])  Total Time spent with patient: 30 minutes  Past Psychiatric History: as above  Past Medical History:  Past Medical History:  Diagnosis Date   Anginal pain ([place])    Anxiety    Arthritis    Bipolar 1 disorder ([place])    Bursitis    CAD (coronary artery disease)    Chronic pain    COPD (chronic obstructive pulmonary disease) (HCC)    Current use of long term anticoagulation    DAPT (ASA + clopidogrel)   Depression    Diverticulitis    Dyspnea    GERD (gastroesophageal reflux disease)    Grade I diastolic dysfunction    Hepatitis C 2012   No longer has Hep C   HLD (hyperlipidemia)    Hypertension    MI (myocardial infarction) ([place])    Polysubstance abuse (HCC)    cocaine, marijuana, ETOH   PUD (peptic ulcer disease)    S/P angioplasty with stent 06/10/2016   a.) 90% stenosis of pLAD to mLAD - 2.5 x 18 mm Xience Alpine (DES x 1) placed to pLAD   S/P PTCA (  percutaneous transluminal coronary angioplasty) 12/04/2019   a.) 60% in stent restenosis of DES to pLAD; LVEF 65%.   Schizophrenia ([place])    Stroke [facility])    Valvular insufficiency    a.) Mild MR, TR, PR; mild to moderate AR on 03/05/2018 TTE    Past Surgical History:  Procedure Laterality Date   ABDOMINAL SURGERY     removed small piece of intestines due to [facility] Diverticulosis   APPENDECTOMY     BACK SURGERY      CARDIAC CATHETERIZATION Left 06/10/2016   Procedure: Left Heart Cath and Coronary Angiography;  Surgeon: Dionisio David, MD;  Location: [place] CV LAB;  Service: Cardiovascular;  Laterality: Left;   CARDIAC CATHETERIZATION N/A 06/10/2016   Procedure: Coronary Stent Intervention;  Surgeon: Yolonda Kida, MD;  Location: [place] CV LAB;  Service: Cardiovascular;  Laterality: N/A;   CHOLECYSTECTOMY N/A 02/17/2022   Procedure: LAPAROSCOPIC CHOLECYSTECTOMY;  Surgeon: Kieth Brightly Arta Bruce, MD;  Location: [place];  Service: General;  Laterality: N/A;   COLON SURGERY     COLONOSCOPY     COLONOSCOPY WITH PROPOFOL N/A 01/05/2017   Procedure: COLONOSCOPY WITH PROPOFOL;  Surgeon: Jonathon Bellows, MD;  Location: [facility] ENDOSCOPY;  Service: Endoscopy;  Laterality: N/A;   COLONOSCOPY WITH PROPOFOL N/A 02/13/2020   Procedure: COLONOSCOPY WITH PROPOFOL;  Surgeon: Virgel Manifold, MD;  Location: [facility] ENDOSCOPY;  Service: Endoscopy;  Laterality: N/A;   CORONARY ANGIOPLASTY WITH STENT PLACEMENT     ESOPHAGOGASTRODUODENOSCOPY (EGD) WITH PROPOFOL N/A 01/05/2017   Procedure: ESOPHAGOGASTRODUODENOSCOPY (EGD) WITH PROPOFOL;  Surgeon: Jonathon Bellows, MD;  Location: [facility] ENDOSCOPY;  Service: Endoscopy;  Laterality: N/A;   ESOPHAGOGASTRODUODENOSCOPY (EGD) WITH PROPOFOL N/A 02/13/2020   Procedure: ESOPHAGOGASTRODUODENOSCOPY (EGD) WITH PROPOFOL;  Surgeon: Virgel Manifold, MD;  Location: [facility] ENDOSCOPY;  Service: Endoscopy;  Laterality: N/A;   INTRAVASCULAR PRESSURE WIRE/FFR STUDY N/A 12/04/2019   Procedure: INTRAVASCULAR PRESSURE WIRE/FFR STUDY;  Surgeon: Sherren Mocha, MD;  Location: [place] CV LAB;  Service: Cardiovascular;  Laterality: N/A;   KNEE ARTHROSCOPY WITH MEDIAL MENISECTOMY Right 09/05/2017   Procedure: KNEE ARTHROSCOPY WITH MEDIAL AND LATERAL  MENISECTOMY PARTIAL SYNOVECTOMY;  Surgeon: Hessie Knows, MD;  Location: [facility] ORS;  Service: Orthopedics;  Laterality: Right;   LEFT HEART CATH AND  CORONARY ANGIOGRAPHY N/A 12/04/2019   Procedure: LEFT HEART CATH AND CORONARY ANGIOGRAPHY;  Surgeon: Sherren Mocha, MD;  Location: [place] CV LAB;  Service: Cardiovascular;  Laterality: N/A;   SHOULDER SURGERY Right 04/09/2012   SPINE SURGERY     TOTAL KNEE ARTHROPLASTY Right 01/19/2021   Procedure: TOTAL KNEE ARTHROPLASTY - Rachelle Hora to Assist;  Surgeon: Hessie Knows, MD;  Location: [facility] ORS;  Service: Orthopedics;  Laterality: Right;   Family History:  Family History  Problem Relation Age of Onset   Osteoarthritis Mother    Heart disease Mother    Hypertension Mother    Depression Mother    Heart disease Father    Early death Father    Hypertension Father    Heart attack Father    Hypertension Sister    Prostate cancer Neg Hx    Bladder Cancer Neg Hx    Kidney cancer Neg Hx    Family Psychiatric  History: none Social History:  Social History   Substance and Sexual Activity  Alcohol Use Yes   Alcohol/week: 6.0 standard drinks of alcohol   Types: 6 Cans of beer per week   Comment: occassionally      Social History   Substance  and Sexual Activity  Drug Use Not Currently   Types: Cocaine, Marijuana   Comment: last on saturday    Social History   Socioeconomic History   Marital status: Widowed    Spouse name: Not on file   Number of children: Not on file   Years of education: Not on file   Highest education level: Not on file  Occupational History   Occupation: disability   Occupation: stocking at gas station    Comment: 2 days per week  Tobacco Use   Smoking status: Some Days    Packs/day: 0.50    Years: 32.00    Total pack years: 16.00    Types: Cigarettes    Last attempt to quit: 10/25/2021    Years since quitting: 0.7   Smokeless tobacco: Never   Tobacco comments:    Continues to smoke approximately 5 cigarettes per day.  Has been 2 days since las cigarette.  Has not bought any more.  Using patches and gum.  5/23 hfb  Vaping Use   Vaping Use:  Never used  Substance and Sexual Activity   Alcohol use: Yes    Alcohol/week: 6.0 standard drinks of alcohol    Types: 6 Cans of beer per week    Comment: occassionally    Drug use: Not Currently    Types: Cocaine, Marijuana    Comment: last on saturday   Sexual activity: Yes    Partners: Female    Birth control/protection: None  Other Topics Concern   Not on file  Social History Narrative   Not on file   Social Determinants of Health   Financial Resource Strain: Not on file  Food Insecurity: No Food Insecurity (08/05/2022)   Hunger Vital Sign    Worried About Running Out of Food in the Last Year: Never true    Ran Out of Food in the Last Year: Never true  Transportation Needs: No Transportation Needs (08/05/2022)   PRAPARE - Hydrologist (Medical): No    Lack of Transportation (Non-Medical): No  Physical Activity: Not on file  Stress: Not on file  Social Connections: Not on file    Current Medications: Current Facility-Administered Medications  Medication Dose Route Frequency Provider Last Rate Last Admin   albuterol (VENTOLIN HFA) 108 (90 Base) MCG/ACT inhaler 2 puff  2 puff Inhalation Q6H PRN Coralyn Pear B, MD   2 puff at 08/08/22 1428   aspirin EC tablet 81 mg  81 mg Oral Daily [name], MD   81 mg at 08/08/22 8366   atorvastatin (LIPITOR) tablet 80 mg  80 mg Oral QHS [name], MD   80 mg at 08/08/22 2126   Budeson-Glycopyrrol-Formoterol 160-9-4.8 MCG/ACT AERO 2 puff  2 puff Inhalation BH-qamhs Massengill, Ovid Curd, MD   2 puff at 08/08/22 2121   busPIRone (BUSPAR) tablet 15 mg  15 mg Oral Daily [name], MD   15 mg at 08/08/22 0816   cyclobenzaprine (FLEXERIL) tablet 10 mg  10 mg Oral BID PRN [name], MD   10 mg at 08/08/22 2125   divalproex (DEPAKOTE) DR tablet 500 mg  500 mg Oral BID [name], MD   500 mg at 08/08/22 1656   docusate sodium (COLACE) capsule 100 mg  100 mg Oral Daily PRN [name], MD        DULoxetine (CYMBALTA) DR capsule 90 mg  90 mg Oral Daily Corky Sox, MD   90 mg at 08/08/22 [phone]  enalapril (VASOTEC) tablet 2.5 mg  2.5 mg Oral BID [name], MD   2.5 mg at 08/08/22 1656   fluticasone (FLONASE) 50 MCG/ACT nasal spray 1 spray  1 spray Each Nare Daily [name], MD   1 spray at 43/32/95 1884   folic acid (FOLVITE) tablet 1 mg  1 mg Oral Daily [name], MD   1 mg at 08/08/22 0817   gabapentin (NEURONTIN) capsule 300 mg  300 mg Oral TID Coralyn Pear B, MD   300 mg at 08/08/22 1656   hydrocortisone cream 1 %   Topical BID PRN Corky Sox, MD   Given at 08/07/22 1433   hydrOXYzine (ATARAX) tablet 25 mg  25 mg Oral TID PRN Corky Sox, MD   25 mg at 08/08/22 2125   ibuprofen (ADVIL) tablet 600 mg  600 mg Oral Q6H PRN [name], MD   600 mg at 08/09/22 0528   loratadine (CLARITIN) tablet 10 mg  10 mg Oral Daily [name], MD   10 mg at 08/08/22 0817   OLANZapine zydis (ZYPREXA) disintegrating tablet 5 mg  5 mg Oral Q8H PRN Corky Sox, MD       And   LORazepam (ATIVAN) tablet 1 mg  1 mg Oral PRN Corky Sox, MD       And   ziprasidone (GEODON) injection 20 mg  20 mg Intramuscular PRN Corky Sox, MD       meloxicam [facility]) tablet 7.5 mg  7.5 mg Oral Daily [name], MD   7.5 mg at 08/08/22 1660   multivitamin with minerals tablet 1 tablet  1 tablet Oral Daily [name], MD   1 tablet at 08/08/22 0815   nicotine (NICODERM CQ - dosed in mg/24 hours) patch 21 mg  21 mg Transdermal Daily [name], MD   21 mg at 08/08/22 6301   Oral care mouth rinse  15 mL Mouth Rinse PRN [name], MD       pantoprazole (PROTONIX) EC tablet 20 mg  20 mg Oral Daily [name], MD   20 mg at 08/08/22 0817   rOPINIRole (REQUIP) tablet 1 mg  1 mg Oral QHS Corky Sox, MD   1 mg at 08/08/22 2126   tamsulosin (FLOMAX) capsule 0.4 mg  0.4 mg Oral Daily [name], MD   0.4 mg at 08/08/22 6010   thiamine  (Vitamin B-1) tablet 100 mg  100 mg Oral Daily [name], MD   100 mg at 08/08/22 0817   traZODone (DESYREL) tablet 100 mg  100 mg Oral QHS Corky Sox, MD   100 mg at 08/08/22 2126    Lab Results:  No results found for this or any previous visit (from the past 9 hour(s)).   Blood Alcohol level:  Lab Results  Component Value Date   ETH <10 08/03/2022   ETH <10 93/23/5573    Metabolic Disorder Labs: Lab Results  Component Value Date   HGBA1C 5.2 04/26/2022   MPG 102.54 04/26/2022   MPG 103 12/28/2016   No results found for: "PROLACTIN" Lab Results  Component Value Date   CHOL 83 04/26/2022   TRIG 52 04/26/2022   HDL 28 (L) 04/26/2022   CHOLHDL 3.0 04/26/2022   VLDL 10 04/26/2022   LDLCALC 45 04/26/2022   LDLCALC 41 08/31/2021   Psychiatric Specialty Exam: General Appearance: appears at stated age, casually dressed and groomed   Behavior: pleasant and cooperative   Psychomotor Activity: no psychomotor agitation or retardation noted  Eye Contact: fair  Speech: normal amount, tone, volume and fluency    Mood: euthymic  Affect: congruent, pleasant and interactive   Thought Process: linear, goal directed, no circumstantial or tangential thought process noted, no racing thoughts or flight of ideas  Descriptions of Associations: intact   Thought Content Hallucinations: denies AH, VH , does not appear responding to stimuli  Delusions: no paranoia, delusions of control, grandeur, ideas of reference, thought broadcasting, and magical thinking  Suicidal Thoughts: denies SI, intention, plan  Homicidal Thoughts: denies HI, intention, plan   Alertness/Orientation: alert and fully oriented   Insight: fair Judgment: fair  Memory: intact   Executive Functions  Concentration: intact  Attention Span: fair  Recall: intact  Fund of Knowledge: fair   Physical Exam  Constitutional:      Appearance: Normal appearance.  Cardiovascular:     Rate and Rhythm:  bradycardic, pulse 55 Pulmonary:     Effort: Pulmonary effort is normal.  Neurological:     General: No focal deficit present.     Mental Status: Alert and oriented to person, place, and time.    Review of Systems  Constitutional: Negative.  Negative for chills, fever and weight loss.  HENT: Negative.    Eyes: Negative.   Respiratory: Negative.    Cardiovascular: Negative.   Gastrointestinal:  Negative for constipation, diarrhea, nausea and vomiting.  Genitourinary: Negative.   Musculoskeletal: Lower back pain.   Skin: Negative.   Neurological: Negative.  Negative for tingling.       Treatment Plan Summary: Daily contact with patient to assess and evaluate symptoms and progress in treatment and Medication management  ASSESSMENT:   Diagnoses / Active Problems: Substance-induced mood disorder Cocaine use disorder Intermittent explosive disorder   PLAN: Safety and Monitoring:             -- Voluntary admission to inpatient psychiatric unit for safety, stabilization and treatment             -- Daily contact with patient to assess and evaluate symptoms and progress in treatment             -- Patient's case to be discussed in multi-disciplinary team meeting             -- Observation Level : q15 minute checks             -- Vital signs:  q12 hours             -- Precautions: suicide, elopement, and assault   2. Psychiatric Diagnoses and Treatment:  - Continue home Cymbalta at 90 mg for depression, increased from home dose of 60 mg - Continue Depakote at 500 mg twice daily (dose increased at medical hospital) - VPA level 1/16- 27 (low) - Do not restart home Zyprexa given overdose - Replace with trazodone 100 mg nightly for sleep - Continue Requip 1 mg (reduced from 2 mg) nightly.  The medication is used to treat his restless leg syndrome.  Feel this is a concerning medication for the patient to be on given his anger issues and impulsivity.  Dopamine agonists have certainly  been shown to increase impulsivity in Parkinson's.  We will reassess the need for returning to his home dose if his restless leg syndrome gets worse. --  The risks/benefits/side-effects/alternatives to this medication were discussed in detail with the patient and time was given for questions. The patient consents to medication trial.             --  Encouraged patient to participate in unit milieu and in scheduled group therapies              -- Short Term Goals: Ability to verbalize feelings will improve             -- Long Term Goals: Improvement in symptoms so as ready for discharge                3. Medical Issues Being Addressed:              Tobacco Use Disorder             -- Nicotine patch '21mg'$ /24 hours ordered             -- Smoking cessation encouraged - Continue Gabapentin to 300 mg TID for chronic pain - Patient denies opioid abuse, UDS negative for opioids   4. Discharge Planning:              -- Social work and case management to assist with discharge planning and identification of hospital follow-up needs prior to discharge             -- Estimated LOS: 5-7 days             -- Discharge Concerns: Need to establish a safety plan; Medication compliance and effectiveness             -- Discharge Goals: Return home with outpatient referrals for mental health follow-up including medication management/psychotherapy - Patient would benefit from pain management specialist, who will not prescribe the patient opioids. Previously was on Suboxone and his mom reports he did well. - Patient has declined residential rehab, but is interested in outpatient options - Recommend CD IOP with Malka So in [place], MD 08/09/2022, 7:03 AM

## 2022-08-09 NOTE — BHH Group Notes (Signed)
Spiritual care group on grief and loss facilitated by Chaplain Janne Napoleon, Bcc and Lysle Morales, counseling intern.  Group Goal: Support / Education around grief and loss  Members engage in facilitated group support and psycho-social education.  Group Description:  Following introductions and group rules, group members engaged in facilitated group dialogue and support around topic of loss, with particular support around experiences of loss in their lives. Group Identified types of loss (relationships / self / things) and identified patterns, circumstances, and changes that precipitate losses. Reflected on thoughts / feelings around loss, normalized grief responses, and recognized variety in grief experience. Group encouraged individual reflection on safe space and on the coping skills that they are already utilizing.  Group drew on Adlerian / Rogerian and narrative framework  Patient Progress: Keith Gibbs attended group and actively engaged in group conversation.  He shared about the loss of his wife followed closely by the loss of his home and the loss of his youngest daughter to a tragic accident.  He was incredibly appreciative of the group and expressed this on multiple occasions.  There were times that he dominated the group, but his comments showed good insight and were helpful for the group processing.  He has cultivated his bedroom to be his safe space with a combination of music and memorabilia.  [address] [place] Pager, [phone]

## 2022-08-09 NOTE — Group Note (Signed)
Date:  08/09/2022 Time:  2:50 PM  Group Topic/Focus:  Orientation:   The focus of this group is to educate the patient on the purpose and policies of crisis stabilization and provide a format to answer questions about their admission.  The group details unit policies and expectations of patients while admitted.    Participation Level:  Active  Participation Quality:  Appropriate  Affect:  Appropriate  Cognitive:  Appropriate  Insight: Appropriate  Engagement in Group:  Engaged  Modes of Intervention:  Discussion  Additional Comments:     Jerrye Beavers 08/09/2022, 2:50 PM

## 2022-08-10 ENCOUNTER — Other Ambulatory Visit (HOSPITAL_COMMUNITY): Payer: Self-pay | Admitting: *Deleted

## 2022-08-10 DIAGNOSIS — F329 Major depressive disorder, single episode, unspecified: Secondary | ICD-10-CM | POA: Diagnosis present

## 2022-08-10 MED ORDER — DULOXETINE HCL 30 MG PO CPEP: 90.0000 mg | ORAL_CAPSULE | Freq: Every day | ORAL | 0 refills | Status: DC

## 2022-08-10 MED ORDER — TRAZODONE HCL 100 MG PO TABS: 100.0000 mg | ORAL_TABLET | Freq: Every evening | ORAL | Status: DC | PRN

## 2022-08-10 MED ORDER — ROPINIROLE HCL 1 MG PO TABS: 1.0000 mg | ORAL_TABLET | Freq: Every day | ORAL | 0 refills | Status: DC

## 2022-08-10 MED ORDER — HYDROXYZINE HCL 25 MG PO TABS: 25.0000 mg | ORAL_TABLET | Freq: Three times a day (TID) | ORAL | 0 refills | Status: DC | PRN

## 2022-08-10 MED ORDER — NICOTINE 21 MG/24HR TD PT24: 21.0000 mg | MEDICATED_PATCH | Freq: Every day | TRANSDERMAL | 0 refills | Status: DC

## 2022-08-10 MED ORDER — TRAZODONE HCL 100 MG PO TABS: 100.0000 mg | ORAL_TABLET | Freq: Every evening | ORAL | 0 refills | Status: DC | PRN

## 2022-08-10 NOTE — Progress Notes (Signed)
  [facility] Adult Case Management Discharge Plan :  Will you be returning to the same living situation after discharge:  Yes,  Home with mother  At discharge, do you have transportation home?: Yes,  Mother  Do you have the ability to pay for your medications: Yes,  Medicaid   Release of information consent forms completed and in the chart;  Patient's signature needed at discharge.  Patient to Follow up at:  [facility]. Go on 08/12/2022.   Specialty: Behavioral Health Why: Please go to this provider for a re-assessment, to obtain therapy and medication management services on Monday through Friday, arrive by 7:20 am. Services are provided on a first come, first served basis. Contact information: [place] [phone]                Next level of care provider has access to [place] and Suicide Prevention discussed: Yes,  with patient and mother      Has patient been referred to the Quitline?: N/A patient is not a smoker  Patient has been referred for addiction treatment: Pt. refused referral, patient may inquire about SAIOP services and groups during his next appointment.   Darleen Crocker, LCSWA 08/10/2022, 10:05 AM

## 2022-08-10 NOTE — Discharge Summary (Signed)
Physician Discharge Summary Note Patient:  Keith Gibbs is an 62 y.o., male MRN:  [id number] DOB:  1961/01/15 Patient phone:  [phone] (home)  Patient address:   [place] [address],  Total Time spent with patient: 30 minutes  Date of Admission:  08/05/2022 Date of Discharge: 08/10/2022  Reason for Admission:  SI with OD on zyprexa  Principal Problem: Substance induced mood disorder ([place]) Discharge Diagnoses: Principal Problem:   Substance induced mood disorder ([place]) Active Problems:   Chronic pain syndrome   Cocaine use   Angina at rest   CAD S/P percutaneous coronary angioplasty   Suicide attempt by drug overdose [facility])   Past Psychiatric History: See h&p  Past Medical History:  Past Medical History:  Diagnosis Date   Anginal pain ([place])    Anxiety    Arthritis    Bipolar 1 disorder ([place])    Bursitis    CAD (coronary artery disease)    Chronic pain    COPD (chronic obstructive pulmonary disease) ([place])    Current use of long term anticoagulation    DAPT (ASA + clopidogrel)   Depression    Diverticulitis    Dyspnea    GERD (gastroesophageal reflux disease)    Grade I diastolic dysfunction    Hepatitis C 2012   No longer has Hep C   HLD (hyperlipidemia)    Hypertension    MI (myocardial infarction) ([place])    Polysubstance abuse ([place])    cocaine, marijuana, ETOH   PUD (peptic ulcer disease)    S/P angioplasty with stent 06/10/2016   a.) 90% stenosis of pLAD to mLAD - 2.5 x 18 mm Xience Alpine (DES x 1) placed to pLAD   S/P PTCA (percutaneous transluminal coronary angioplasty) 12/04/2019   a.) 60% in stent restenosis of DES to pLAD; LVEF 65%.   Schizophrenia ([place])    Stroke [facility])    Valvular insufficiency    a.) Mild MR, TR, PR; mild to moderate AR on 03/05/2018 TTE    Past Surgical History:  Procedure Laterality Date   ABDOMINAL SURGERY     removed small piece of intestines due to [facility] Diverticulosis   APPENDECTOMY     BACK  SURGERY     CARDIAC CATHETERIZATION Left 06/10/2016   Procedure: Left Heart Cath and Coronary Angiography;  Surgeon: Dionisio David, MD;  Location: [place] CV LAB;  Service: Cardiovascular;  Laterality: Left;   CARDIAC CATHETERIZATION N/A 06/10/2016   Procedure: Coronary Stent Intervention;  Surgeon: Yolonda Kida, MD;  Location: [place] CV LAB;  Service: Cardiovascular;  Laterality: N/A;   CHOLECYSTECTOMY N/A 02/17/2022   Procedure: LAPAROSCOPIC CHOLECYSTECTOMY;  Surgeon: Kieth Brightly Arta Bruce, MD;  Location: [place];  Service: General;  Laterality: N/A;   COLON SURGERY     COLONOSCOPY     COLONOSCOPY WITH PROPOFOL N/A 01/05/2017   Procedure: COLONOSCOPY WITH PROPOFOL;  Surgeon: Jonathon Bellows, MD;  Location: [facility] ENDOSCOPY;  Service: Endoscopy;  Laterality: N/A;   COLONOSCOPY WITH PROPOFOL N/A 02/13/2020   Procedure: COLONOSCOPY WITH PROPOFOL;  Surgeon: Virgel Manifold, MD;  Location: [facility] ENDOSCOPY;  Service: Endoscopy;  Laterality: N/A;   CORONARY ANGIOPLASTY WITH STENT PLACEMENT     ESOPHAGOGASTRODUODENOSCOPY (EGD) WITH PROPOFOL N/A 01/05/2017   Procedure: ESOPHAGOGASTRODUODENOSCOPY (EGD) WITH PROPOFOL;  Surgeon: Jonathon Bellows, MD;  Location: [facility] ENDOSCOPY;  Service: Endoscopy;  Laterality: N/A;   ESOPHAGOGASTRODUODENOSCOPY (EGD) WITH PROPOFOL N/A 02/13/2020   Procedure: ESOPHAGOGASTRODUODENOSCOPY (EGD) WITH PROPOFOL;  Surgeon: Vonda Antigua  B, MD;  Location: [facility] ENDOSCOPY;  Service: Endoscopy;  Laterality: N/A;   INTRAVASCULAR PRESSURE WIRE/FFR STUDY N/A 12/04/2019   Procedure: INTRAVASCULAR PRESSURE WIRE/FFR STUDY;  Surgeon: Sherren Mocha, MD;  Location: [place] CV LAB;  Service: Cardiovascular;  Laterality: N/A;   KNEE ARTHROSCOPY WITH MEDIAL MENISECTOMY Right 09/05/2017   Procedure: KNEE ARTHROSCOPY WITH MEDIAL AND LATERAL  MENISECTOMY PARTIAL SYNOVECTOMY;  Surgeon: Hessie Knows, MD;  Location: [facility] ORS;  Service: Orthopedics;  Laterality: Right;   LEFT HEART  CATH AND CORONARY ANGIOGRAPHY N/A 12/04/2019   Procedure: LEFT HEART CATH AND CORONARY ANGIOGRAPHY;  Surgeon: Sherren Mocha, MD;  Location: [place] CV LAB;  Service: Cardiovascular;  Laterality: N/A;   SHOULDER SURGERY Right 04/09/2012   SPINE SURGERY     TOTAL KNEE ARTHROPLASTY Right 01/19/2021   Procedure: TOTAL KNEE ARTHROPLASTY - Rachelle Hora to Assist;  Surgeon: Hessie Knows, MD;  Location: [facility] ORS;  Service: Orthopedics;  Laterality: Right;    Family History:  See h&p  Social History:  See h&p  Hospital Course:   During the patient's hospitalization, patient had extensive initial psychiatric evaluation, and follow-up psychiatric evaluations every day.  Psychiatric diagnoses provided upon initial assessment: Principal Problem:   Substance induced mood disorder (H[facility]) Active Problems:   Chronic pain syndrome   Cocaine use   Angina at rest   CAD S/P percutaneous coronary angioplasty   Suicide attempt by drug overdose ([place])   The following medications were managed:  albuterol, cyclobenzaprine, docusate sodium, hydrocortisone cream, hydrOXYzine, ibuprofen, OLANZapine zydis **AND** LORazepam **AND** ziprasidone, mouth rinse, traZODone  aspirin EC  81 mg Oral Daily   atorvastatin  80 mg Oral QHS   Budeson-Glycopyrrol-Formoterol  2 puff Inhalation BH-qamhs   busPIRone  15 mg Oral Daily   divalproex  500 mg Oral BID   DULoxetine  90 mg Oral Daily   enalapril  2.5 mg Oral BID   fluticasone  1 spray Each Nare Daily   folic acid  1 mg Oral Daily   gabapentin  300 mg Oral TID   loratadine  10 mg Oral Daily   meloxicam  7.5 mg Oral Daily   multivitamin with minerals  1 tablet Oral Daily   nicotine  21 mg Transdermal Daily   pantoprazole  20 mg Oral Daily   rOPINIRole  1 mg Oral QHS   tamsulosin  0.4 mg Oral Daily   thiamine  100 mg Oral Daily    During the hospitalization, patient had the following lab / imaging / testing abnormalities which require further evaluation  / management / treatment: low valproic acid level  Patient's care was discussed during the interdisciplinary team meeting every day during the hospitalization.  The patient denies any side effects to prescribed psychiatric medication.  Desmon Hitchner is a 62 year old male with a history of numerous substance use disorders (opioids, cocaine, alcohol), reported schizoaffective disorder-bipolar type, px to the hospital due to [facility]  Gradually, patient started adjusting to milieu. The patient was evaluated each day by a clinical provider to ascertain response to treatment. Improvement was noted by the patient's report of decreasing symptoms, improved sleep and appetite, affect, medication tolerance, behavior, and participation in unit programming.  Patient was asked each day to complete a self inventory noting mood, mental status, pain, new symptoms, anxiety and concerns.    Symptoms were reported as significantly decreased or resolved completely by discharge.   On day of discharge, the patient reports that their mood is stable. The patient  denied having suicidal thoughts for more than 48 hours prior to discharge.  Patient denies having homicidal thoughts.  Patient denies having auditory hallucinations.  Patient denies any visual hallucinations or other symptoms of psychosis. The patient was motivated to continue taking medication with a goal of continued improvement in mental health.   The patient reports their target psychiatric symptoms of SI responded well to the psychiatric medications, and the patient reports overall benefit other psychiatric hospitalization. Supportive psychotherapy was provided to the patient. The patient also participated in regular group therapy while hospitalized. Coping skills, problem solving as well as relaxation therapies were also part of the unit programming.  Labs were reviewed with the patient, and abnormal results were discussed with the patient.  The patient is able to  verbalize their individual safety plan to this provider.  Behavioral Events: none  Restraints: none  # It is recommended to the patient to continue psychiatric medications as prescribed, after discharge from the hospital.    # It is recommended to the patient to follow up with your outpatient psychiatric provider and PCP.  # It was discussed with the patient, the impact of alcohol, drugs, tobacco have been there overall psychiatric and medical wellbeing, and total abstinence from substance use was recommended to the patient.  # Prescriptions provided or sent directly to preferred pharmacy at discharge. Patient agreeable to plan. Given opportunity to ask questions. Appears to feel comfortable with discharge.    # In the event of worsening symptoms, the patient is instructed to call the crisis hotline, 911 and or go to the nearest ED for appropriate evaluation and treatment of symptoms. To follow-up with primary care provider for other medical issues, concerns and or health care needs  # Patient was discharged home with a plan to follow up as noted below.  Physical Findings: AIMS: Facial and Oral Movements Muscles of Facial Expression: None, normal Lips and Perioral Area: None, normal Jaw: None, normal Tongue: None, normal,Extremity Movements Upper (arms, wrists, hands, fingers): None, normal Lower (legs, knees, ankles, toes): None, normal, Trunk Movements Neck, shoulders, hips: None, normal, Overall Severity Severity of abnormal movements (highest score from questions above): None, normal Incapacitation due to abnormal movements: None, normal Patient's awareness of abnormal movements (rate only patient's report): No Awareness, Dental Status Current problems with teeth and/or dentures?: No Does patient usually wear dentures?: No  CIWA:    COWS:     Mental Status Exam: General Appearance: appears at stated age, casually dressed and groomed   Behavior: pleasant and cooperative    Psychomotor Activity: no psychomotor agitation or retardation noted   Eye Contact: fair  Speech: normal amount, tone, volume and fluency    Mood: euthymic  Affect: congruent, pleasant and interactive   Thought Process: linear, goal directed, no circumstantial or tangential thought process noted, no racing thoughts or flight of ideas  Descriptions of Associations: intact  Thought Content: no bizarre content, logical and future-oriented  Hallucinations: denies AH, VH , does not appear responding to stimuli  Delusions: no paranoia, delusions of control, grandeur, ideas of reference, thought broadcasting, and magical thinking  Suicidal Thoughts: denies SI, intention, plan  Homicidal Thoughts: denies HI, intention, plan   Alertness/Orientation: alert and fully oriented   Insight: fair, improved  Judgment: fair, improved   Memory: intact   Executive Functions  Concentration: intact  Attention Span: fair  Recall: intact  Fund of Knowledge: fair   Physical Exam  Constitutional:      Appearance: Normal appearance.  Cardiovascular:     Rate and Rhythm: Normal rate.  Pulmonary:     Effort: Pulmonary effort is normal.  Neurological:     General: No focal deficit present.     Mental Status: Alert and oriented to person, place, and time.    Review of Systems  Constitutional: Negative.  Negative for chills, fever and weight loss.  HENT: Negative.    Eyes: Negative.   Respiratory: Negative.    Cardiovascular: Negative.   Gastrointestinal:  Negative for constipation, diarrhea, nausea and vomiting.  Genitourinary: Negative.   Musculoskeletal: Negative.   Skin: Negative.   Neurological: Negative.  Negative for tingling.     Blood pressure 109/75, pulse 60, temperature 97.8 F (36.6 C), temperature source Oral, resp. rate 16, height '6\' 3"'$  (1.905 m), weight 89.8 kg, SpO2 98 %. Body mass index is 24.75 kg/m.  Assets  Assets:Communication Skills; Desire for Improvement;  Housing; Social Support   Social History   Tobacco Use  Smoking Status Some Days   Packs/day: 0.50   Years: 32.00   Total pack years: 16.00   Types: Cigarettes   Last attempt to quit: 10/25/2021   Years since quitting: 0.7  Smokeless Tobacco Never  Tobacco Comments   Continues to smoke approximately 5 cigarettes per day.  Has been 2 days since las cigarette.  Has not bought any more.  Using patches and gum.  5/23 hfb   Tobacco Cessation:  A prescription for an FDA-approved tobacco cessation medication provided at discharge  Blood Alcohol level:  Lab Results  Component Value Date   [facility] <10 08/03/2022   ETH <10 28/78/6767    Metabolic Disorder Labs:  Lab Results  Component Value Date   HGBA1C 5.2 04/26/2022   MPG 102.54 04/26/2022   MPG 103 12/28/2016   No results found for: "PROLACTIN" Lab Results  Component Value Date   CHOL 83 04/26/2022   TRIG 52 04/26/2022   HDL 28 (L) 04/26/2022   CHOLHDL 3.0 04/26/2022   VLDL 10 04/26/2022   LDLCALC 45 04/26/2022   LDLCALC 41 08/31/2021    Discharge destination: home  Is patient on multiple antipsychotic therapies at discharge:  No   Has Patient had three or more failed trials of antipsychotic monotherapy by history:  No  Recommended Plan for Multiple Antipsychotic Therapies: NA   Allergies as of 08/10/2022       Reactions   Latuda [lurasidone Hcl] Other (See Comments)   Tremors   Saphris [asenapine] Other (See Comments)   Increased tremors        Medication List     STOP taking these medications    docusate sodium 100 MG capsule Commonly known as: COLACE   enalapril 2.5 MG tablet Commonly known as: VASOTEC   fluticasone 50 MCG/ACT nasal spray Commonly known as: FLONASE   multivitamin with minerals Tabs tablet   nitroGLYCERIN 0.3 MG SL tablet Commonly known as: NITROSTAT   pantoprazole 20 MG tablet Commonly known as: PROTONIX   tamsulosin 0.4 MG Caps capsule Commonly known as: FLOMAX    thiamine 100 MG tablet Commonly known as: Vitamin B-1       TAKE these medications      Indication  albuterol 108 (90 Base) MCG/ACT inhaler Commonly known as: VENTOLIN HFA Inhale 2 puffs into the lungs every 6 (six) hours as needed for wheezing or shortness of breath.  Indication: Asthma   aspirin EC 81 MG tablet Take 1 tablet (81 mg total) by mouth daily. Swallow whole.  Indication: Stroke Due To Limited Blood Flow   atorvastatin 80 MG tablet Commonly known as: LIPITOR Take 1 tablet (80 mg total) by mouth at bedtime.  Indication: High Amount of Fats in the Blood   Breztri Aerosphere 160-9-4.8 MCG/ACT Aero Generic drug: Budeson-Glycopyrrol-Formoterol Inhale 2 puffs into the lungs in the morning and at bedtime.  Indication: Chronic Obstructive Lung Disease   busPIRone 15 MG tablet Commonly known as: BUSPAR Take 1 tablet (15 mg total) by mouth daily.  Indication: Anxiety Disorder   cyclobenzaprine 10 MG tablet Commonly known as: FLEXERIL Take 1 tablet (10 mg total) by mouth 2 (two) times daily as needed for muscle spasms.  Indication: Muscle Spasm   diclofenac Sodium 1 % Gel Commonly known as: VOLTAREN Apply 2 g topically 4 (four) times daily.  Indication: Joint Damage causing Pain and Loss of Function   divalproex 500 MG DR tablet Commonly known as: Depakote Take 1 tablet (500 mg total) by mouth 2 (two) times daily.  Indication: Manic-Depression   DULoxetine 30 MG capsule Commonly known as: CYMBALTA Take 3 capsules (90 mg total) by mouth daily. What changed:  medication strength how much to take  Indication: Major Depressive Disorder   gabapentin 300 MG capsule Commonly known as: NEURONTIN Take 300 mg by mouth 3 (three) times daily.  Indication: Generalized Anxiety Disorder   hydrOXYzine 25 MG tablet Commonly known as: ATARAX Take 1 tablet (25 mg total) by mouth 3 (three) times daily as needed for anxiety.  Indication: Feeling Anxious   loratadine 10  MG tablet Commonly known as: CLARITIN Take 1 tablet (10 mg total) by mouth daily.  Indication: Allergic Conjunctivitis   nicotine 21 mg/24hr patch Commonly known as: NICODERM CQ - dosed in mg/24 hours Place 1 patch (21 mg total) onto the skin daily.  Indication: Nicotine Addiction   rOPINIRole 1 MG tablet Commonly known as: REQUIP Take 1 tablet (1 mg total) by mouth at bedtime. What changed:  medication strength how much to take additional instructions  Indication: Restless Leg Syndrome   traZODone 100 MG tablet Commonly known as: DESYREL Take 1 tablet (100 mg total) by mouth at bedtime as needed for sleep.  Indication: [place]. Go to.   Specialty: Behavioral Health Why: Please go to this provider for a re-assessment, to obtain therapy and medication management services on Monday through Friday, arrive by 7:20 am. Services are provided on a first come, first served basis. Contact information: [place] [phone]                Discharge recommendations:   Activity: as tolerated  Diet: heart healthy  # It is recommended to the patient to continue psychiatric medications as prescribed, after discharge from the hospital.     # It is recommended to the patient to follow up with your outpatient psychiatric provider -instructions on appointment date, time, and address (location) are provided to you in discharge paperwork  # Follow-up with outpatient primary care doctor and other specialists -for management of chronic medical disease, including: lower back pain  # Testing: Follow-up with outpatient provider for abnormal lab results: low valproic acid level   # It was discussed with the patient, the impact of alcohol, drugs, tobacco have been there overall psychiatric and medical wellbeing, and total abstinence from substance use was recommended to  the patient.   #  Prescriptions provided or sent directly to preferred pharmacy at discharge. Patient agreeable to plan. Given opportunity to ask questions. Appears to feel comfortable with discharge.    # In the event of worsening symptoms, the patient is instructed to call the crisis hotline, 911, and or go to the nearest ED for appropriate evaluation and treatment of symptoms. To follow-up with primary care provider for other medical issues, concerns and or health care needs  Patient agrees with D/C instructions and plan.   I discussed my assessment, planned testing and intervention for the patient with Dr. Renard Hamper who agrees with my formulated course of action.   Signed: Alesia Morin, MD, PGY-1 08/10/2022, 8:07 AM

## 2022-08-10 NOTE — Group Note (Signed)
Occupational Therapy Group Note  Group Topic:Coping Skills  Group Date: 08/08/2022 Start Time: 1400 End Time: 1440 Facilitators: Brantley Stage, OT   Group Description: Group encouraged increased engagement and participation through discussion and activity focused on "Coping Ahead." Patients were split up into teams and selected a card from a stack of positive coping strategies. Patients were instructed to act out/charade the coping skill for other peers to guess and receive points for their team. Discussion followed with a focus on identifying additional positive coping strategies and patients shared how they were going to cope ahead over the weekend while continuing hospitalization stay.  Therapeutic Goal(s): Identify positive vs negative coping strategies. Identify coping skills to be used during hospitalization vs coping skills outside of hospital/at home Increase participation in therapeutic group environment and promote engagement in treatment   Participation Level: Active and Engaged   Participation Quality: Independent   Behavior: Alert and Appropriate   Speech/Thought Process: Directed   Affect/Mood: Appropriate   Insight: Fair   Judgement: Fair   Individualization: pt was engaged in their participation of group discussion/activity. New skills identified  Modes of Intervention: Discussion and Education  Patient Response to Interventions:  Attentive, Engaged, and Interested    Plan: Continue to engage patient in OT groups 2 - 3x/week.  08/10/2022  Brantley Stage, OT  Cornell Barman, OT

## 2022-08-10 NOTE — [facility] Suicide Risk Assessment (Signed)
Suicide Risk Assessment  Discharge Assessment    [facility] Discharge Suicide Risk Assessment   Principal Problem: Substance induced mood disorder ([place]) Discharge Diagnoses: Principal Problem:   Substance induced mood disorder ([place]) Active Problems:   Chronic pain syndrome   Cocaine use   Angina at rest   CAD S/P percutaneous coronary angioplasty   Suicide attempt by drug overdose ([place])   Total Time spent with patient: 30 minutes  Keith Gibbs is a 62 year old male with a history of numerous substance use disorders (opioids, cocaine, alcohol), reported schizoaffective disorder-bipolar type, px to the hospital due to SI   During the patient's hospitalization, patient had extensive initial psychiatric evaluation, and follow-up psychiatric evaluations every day.  Psychiatric diagnoses provided upon initial assessment: Substance induced mood disorder ([facility]) Active Problems:   Chronic pain syndrome   Cocaine use   Angina at rest   CAD S/P percutaneous coronary angioplasty   Suicide attempt by drug overdose [facility])  Patient's psychiatric medications were adjusted on admission:  -Increase home Cymbalta from 60 mg to 90 mg, to help with depression and pain -Continue Depakote at 500 mg twice daily (dose increased at medical hospital) - VPA level 1/16, ordered as trough level - Do not restart home Zyprexa given overdose - Replace with trazodone 100 mg nightly for sleep - Gabapentin 100 mg 3 times daily     During the hospitalization, other adjustments were made to the patient's psychiatric medication regimen: -Gabapentin 300 mg 3 times daily for chronic pain  Patient's care was discussed during the interdisciplinary team meeting every day during the hospitalization.  The patient denied having side effects to prescribed psychiatric medication.  Gradually, patient started adjusting to milieu. The patient was evaluated each day by a clinical provider to ascertain response to treatment.  Improvement was noted by the patient's report of decreasing symptoms, improved sleep and appetite, affect, medication tolerance, behavior, and participation in unit programming.  Patient was asked each day to complete a self inventory noting mood, mental status, pain, new symptoms, anxiety and concerns.    Symptoms were reported as significantly decreased or resolved completely by discharge.   On day of discharge, the patient reports that their mood is stable. The patient denied having suicidal thoughts for more than 48 hours prior to discharge.  Patient denies having homicidal thoughts.  Patient denies having auditory hallucinations.  Patient denies any visual hallucinations or other symptoms of psychosis. The patient was motivated to continue taking medication with a goal of continued improvement in mental health.   The patient reports their target psychiatric symptoms of depression responded well to the psychiatric medications, and the patient reports overall benefit other psychiatric hospitalization. Supportive psychotherapy was provided to the patient. The patient also participated in regular group therapy while hospitalized. Coping skills, problem solving as well as relaxation therapies were also part of the unit programming.  Labs were reviewed with the patient, and abnormal results were discussed with the patient.  The patient is able to verbalize their individual safety plan to this provider.  # It is recommended to the patient to continue psychiatric medications as prescribed, after discharge from the hospital.    # It is recommended to the patient to follow up with your outpatient psychiatric provider and PCP.  # It was discussed with the patient, the impact of alcohol, drugs, tobacco have been there overall psychiatric and medical wellbeing, and total abstinence from substance use was recommended the patient.ed.  # Prescriptions provided or sent  directly to preferred pharmacy at  discharge. Patient agreeable to plan. Given opportunity to ask questions. Appears to feel comfortable with discharge.    # In the event of worsening symptoms, the patient is instructed to call the crisis hotline, 911 and or go to the nearest ED for appropriate evaluation and treatment of symptoms. To follow-up with primary care provider for other medical issues, concerns and or health care needs  # Patient was discharged home with a plan to follow up as noted below.    Musculoskeletal: Strength & Muscle Tone: within normal limits Gait & Station: normal Patient leans: N/A  Psychiatric Specialty Exam Mental Status Exam: General Appearance: appears at stated age, casually dressed and groomed    Behavior: pleasant and cooperative    Psychomotor Activity: no psychomotor agitation or retardation noted    Eye Contact: fair  Speech: normal amount, tone, volume and fluency      Mood: euthymic  Affect: congruent, pleasant and interactive    Thought Process: linear, goal directed, no circumstantial or tangential thought process noted, no racing thoughts or flight of ideas  Descriptions of Associations: intact  Thought Content: no bizarre content, logical and future-oriented  Hallucinations: denies AH, VH , does not appear responding to stimuli  Delusions: no paranoia, delusions of control, grandeur, ideas of reference, thought broadcasting, and magical thinking  Suicidal Thoughts: denies SI, intention, plan  Homicidal Thoughts: denies HI, intention, plan    Alertness/Orientation: alert and fully oriented    Insight: fair, improved  Judgment: fair, improved    Memory: intact    Executive Functions  Concentration: intact  Attention Span: fair  Recall: intact  Fund of Knowledge: fair    Physical Exam  Constitutional:      Appearance: Normal appearance.  Cardiovascular:     Rate and Rhythm: Normal rate.  Pulmonary:     Effort: Pulmonary effort is normal.  Neurological:      General: No focal deficit present.     Mental Status: Alert and oriented to person, place, and time.      Review of Systems  Constitutional: Negative.  Negative for chills, fever and weight loss.  HENT: Negative.    Eyes: Negative.   Respiratory: Negative.    Cardiovascular: Negative.   Gastrointestinal:  Negative for constipation, diarrhea, nausea and vomiting.  Genitourinary: Negative.   Musculoskeletal: Negative.   Skin: Negative.   Neurological: Negative.  Negative for tingling.  Blood pressure 109/75, pulse 60, temperature 97.8 F (36.6 C), temperature source Oral, resp. rate 16, height '6\' 3"'$  (1.905 m), weight 89.8 kg, SpO2 98 %. Body mass index is 24.75 kg/m.  Mental Status Per Nursing Assessment::   On Admission:  Suicidal ideation indicated by patient  Demographic Factors:  Male and Caucasian Loss Factors: NA Historical Factors: Impulsivity Risk Reduction Factors:   Positive social support, Positive therapeutic relationship, and Positive coping skills or problem solving skills   Continued Clinical Symptoms:  Chronic Pain  Cognitive Features That Contribute To Risk:  None    Suicide Risk:  Mild: There are no identifiable suicide plans, no associated intent, mild dysphoria and related symptoms, good self-control (both objective and subjective assessment), few other risk factors, and identifiable protective factors, including available and accessible social support.    [place]. Go to.   Specialty: Behavioral Health Why: Please go to this provider for a re-assessment, to obtain therapy and medication management services on Monday through Friday, arrive  by 7:20 am. Services are provided on a first come, first served basis. Contact information: [place] [phone]                Plan Of Care/Follow-up recommendations:  Activity: as tolerated  Diet: heart  healthy  Other: -Follow-up with your outpatient psychiatric provider -instructions on appointment date, time, and address (location) are provided to you in discharge paperwork.  -Take your psychiatric medications as prescribed at discharge - instructions are provided to you in the discharge paperwork  -Follow-up with outpatient primary care doctor and other specialists -for management of preventative medicine and chronic medical disease, including: lower back pain  -Testing: Follow-up with outpatient provider for abnormal lab results: low valproic acid level   -Recommend abstinence from alcohol, tobacco, and other illicit drug use at discharge.   -If your psychiatric symptoms recur, worsen, or if you have side effects to your psychiatric medications, call your outpatient psychiatric provider, 911, [phone] or go to the nearest emergency department.  -If suicidal thoughts recur, call your outpatient psychiatric provider, 911, [phone] or go to the nearest emergency department.     Alesia Morin, MD 08/10/2022, 8:07 AM

## 2022-08-10 NOTE — Progress Notes (Signed)
   08/09/22 2300  Psych Admission Type (Psych Patients Only)  Admission Status Voluntary  Psychosocial Assessment  Patient Complaints None  Eye Contact Fair  Facial Expression Anxious  Affect Appropriate to circumstance  Speech Logical/coherent  Interaction Assertive  Motor Activity Slow  Appearance/Hygiene Unremarkable  Behavior Characteristics Cooperative;Appropriate to situation;Calm  Mood Pleasant  Thought Process  Coherency WDL  Content WDL  Delusions None reported or observed  Perception WDL  Hallucination None reported or observed  Judgment Poor  Confusion None  Danger to Self  Current suicidal ideation? Denies  Agreement Not to Harm Self Yes  Description of Agreement verbal  Danger to Others  Danger to Others None reported or observed

## 2022-08-10 NOTE — Plan of Care (Signed)
  Problem: Education: Goal: Knowledge of [place] [facility] Education information/materials will improve Outcome: Completed/Met Goal: Emotional status will improve Outcome: Completed/Met Goal: Mental status will improve Outcome: Completed/Met Goal: Verbalization of understanding the information provided will improve Outcome: Completed/Met   Problem: Activity: Goal: Interest or engagement in activities will improve Outcome: Completed/Met Goal: Sleeping patterns will improve Outcome: Completed/Met   Problem: Coping: Goal: Ability to verbalize frustrations and anger appropriately will improve Outcome: Completed/Met Goal: Ability to demonstrate self-control will improve Outcome: Completed/Met

## 2022-08-10 NOTE — Progress Notes (Signed)
D/c Instructions-Education: Keith Gibbs Discharge instructions given to patient, he verbalized understanding of all discharge information given to him. D/c education completed with patient including follow up instructions, medication list, d/c activities limitations if indicated, with other d/c instructions as indicated by MD - patient able to verbalize understanding, all questions fully answered. Patient denied SI/HI/AVH. Suicide prevention information given and discussed with patient and understanding of D/C information verbalized by patient. All questions asked were answered by staff, patient stated they have no questions at time of D/C. Patient received all belongings brought to the unit at discharge. Patient showed appreciation of care given to him at the [facility] unit. Patient was escorted to the lobby and left for home in private auto at 1115.    Dorris Carnes, RN

## 2022-08-10 NOTE — Progress Notes (Signed)
Surgical Instructions    Your procedure is scheduled on Thursday, 08/18/22..  Report to [facility]tr[facility] "A" at 5:30 A.M., then check in with the Admitting office.  Call this number if you have problems the morning of surgery:  [phone]   If you have any questions prior to your surgery date call [phone]: Open Monday-Friday 8am-4pm If you experience any cold or flu symptoms such as cough, fever, chills, shortness of breath, etc. between now and your scheduled surgery, please notify us at the above number     Remember:  Do not eat or drink after midnight the night before your surgery     Take these medicines the morning of surgery with A SIP OF WATER:  busPIRone (BUSPAR)  divalproex (DEPAKOTE)  DULoxetine (CYMBALTA)  gabapentin (NEURONTIN)  loratadine (CLARITIN)  tamsulosin (FLOMAX)   IF NEEDED: albuterol (VENTOLIN HFA) inhaler- bring with you the day of surgery cyclobenzaprine (FLEXERIL)  hydrOXYzine (ATARAX)   As of today, STOP taking any Aspirin (unless otherwise instructed by your surgeon) Aleve, Naproxen, Ibuprofen, Motrin, Advil, Goody's, BC's, all herbal medications, fish oil, diclofenac Sodium (VOLTAREN) gel and all vitamins.           Do not wear jewelry or makeup. Do not wear lotions, powders, cologne or deodorant. Men may shave face and neck. Do not bring valuables to the hospital. Do not wear nail polish, gel polish, artificial nails, or any other type of covering on natural nails (fingers and toes) If you have artificial nails or gel coating that need to be removed by a nail salon, please have this removed prior to surgery. Artificial nails or gel coating may interfere with anesthesia's ability to adequately monitor your vital signs.   is not responsible for any belongings or valuables.    Do NOT Smoke (Tobacco/Vaping)  24 hours prior to your procedure  If you use a CPAP at night, you may bring your mask for your overnight stay.    Contacts, glasses, hearing aids, dentures or partials may not be worn into surgery, please bring cases for these belongings   For patients admitted to the hospital, discharge time will be determined by your treatment team.   Patients discharged the day of surgery will not be allowed to drive home, and someone needs to stay with them for 24 hours.   SURGICAL WAITING ROOM VISITATION Patients having surgery or a procedure may have no more than 2 support people in the waiting area - these visitors may rotate.   Children under the age of 51 must have an adult with them who is not the patient. If the patient needs to stay at the hospital during part of their recovery, the visitor guidelines for inpatient rooms apply. Pre-op nurse will coordinate an appropriate time for 1 support person to accompany patient in pre-op.  This support person may not rotate.   Please refer to [email or web address] for the visitor guidelines for Inpatients (after your surgery is over and you are in a regular room).    Special instructions:    Oral Hygiene is also important to reduce your risk of infection.  Remember - BRUSH YOUR TEETH THE MORNING OF SURGERY WITH YOUR REGULAR TOOTHPASTE   - Preparing For Surgery  Before surgery, you can play an important role. Because skin is not sterile, your skin needs to be as free of germs as possible. You can reduce the number of germs on your skin by washing with CHG (chlorahexidine gluconate) Soap before  surgery.  CHG is an antiseptic cleaner which kills germs and bonds with the skin to continue killing germs even after washing.     Please do not use if you have an allergy to CHG or antibacterial soaps. If your skin becomes reddened/irritated stop using the CHG.  Do not shave (including legs and underarms) for at least 48 hours prior to first CHG shower. It is OK to shave your face.  Please follow these instructions  carefully.     Shower the NIGHT BEFORE SURGERY and the MORNING OF SURGERY with CHG Soap.   If you chose to wash your hair, wash your hair first as usual with your normal shampoo. After you shampoo, rinse your hair and body thoroughly to remove the shampoo.  Then [employer] and genitals (private parts) with your normal soap and rinse thoroughly to remove soap.  After that Use CHG Soap as you would any other liquid soap. You can apply CHG directly to the skin and wash gently with a scrungie or a clean washcloth.   Apply the CHG Soap to your body ONLY FROM THE NECK DOWN.  Do not use on open wounds or open sores. Avoid contact with your eyes, ears, mouth and genitals (private parts). Wash Face and genitals (private parts)  with your normal soap.   Wash thoroughly, paying special attention to the area where your surgery will be performed.  Thoroughly rinse your body with warm water from the neck down.  DO NOT shower/wash with your normal soap after using and rinsing off the CHG Soap.  Pat yourself dry with a CLEAN TOWEL.  Wear CLEAN PAJAMAS to bed the night before surgery  Place CLEAN SHEETS on your bed the night before your surgery  DO NOT SLEEP WITH PETS.   Day of Surgery: Take a shower with CHG soap. Wear Clean/Comfortable clothing the morning of surgery Do not apply any deodorants/lotions.   Remember to brush your teeth WITH YOUR REGULAR TOOTHPASTE.    If you received a COVID test during your pre-op visit, it is requested that you wear a mask when out in public, stay away from anyone that may not be feeling well, and notify your surgeon if you develop symptoms. If you have been in contact with anyone that has tested positive in the last 10 days, please notify your surgeon.    Please read over the following fact sheets that you were given.

## 2022-08-11 ENCOUNTER — Other Ambulatory Visit: Payer: Self-pay

## 2022-08-11 ENCOUNTER — Other Ambulatory Visit (HOSPITAL_COMMUNITY): Payer: Self-pay | Admitting: Urology

## 2022-08-11 ENCOUNTER — Encounter (HOSPITAL_COMMUNITY): Payer: Self-pay

## 2022-08-11 ENCOUNTER — Encounter (HOSPITAL_COMMUNITY)
Admission: RE | Admit: 2022-08-11 | Discharge: 2022-08-11 | Disposition: A | Discharge status: Home / Self Care | Payer: Medicaid Other | Source: Ambulatory Visit | Attending: Neurosurgery | Admitting: Neurosurgery

## 2022-08-11 ENCOUNTER — Other Ambulatory Visit (HOSPITAL_COMMUNITY): Payer: Self-pay

## 2022-08-11 VITALS — BP 122/63 | HR 68 | Temp 98.1°F | Resp 18 | Ht 75.0 in | Wt 208.9 lb

## 2022-08-11 DIAGNOSIS — Z01812 Encounter for preprocedural laboratory examination: Secondary | ICD-10-CM | POA: Diagnosis present

## 2022-08-11 DIAGNOSIS — Z01818 Encounter for other preprocedural examination: Secondary | ICD-10-CM

## 2022-08-11 LAB — TYPE AND SCREEN
ABO/RH(D): O POS
Antibody Screen: NEGATIVE

## 2022-08-11 LAB — SURGICAL PCR SCREEN
MRSA, PCR: NEGATIVE
Staphylococcus aureus: POSITIVE — AB

## 2022-08-11 NOTE — Progress Notes (Signed)
PCP - Geryl Rankins, NP Cardiologist - Neoma Laming, MD  PPM/ICD - Denies  Chest x-ray - 12/16/2021 EKG - 08/04/2022 Stress Test - 03/01/2018 ECHO - 03/01/2018 Cardiac Cath - 12/04/2019  Sleep Study - Denies  DM: Denies   Blood Thinner Instructions:N/A Aspirin Instructions: Patient states Last dose was on 08/04/2022.   ERAS Protcol - NPO PRE-SURGERY Ensure or G2- N/A  COVID TEST- No   Anesthesia review: Yes. Patient cardiac hx. Records requested  Patient denies shortness of breath, fever, cough and chest pain at PAT appointment   All instructions explained to the patient, with a verbal understanding of the material. Patient agrees to go over the instructions while at home for a better understanding. Patient also instructed to self quarantine after being tested for COVID-19. The opportunity to ask questions was provided.

## 2022-08-11 NOTE — Progress Notes (Signed)
Surgical Instructions    Your procedure is scheduled on Thursday, 08/18/22..  Report to [facility] "A" at 5:30 A.M., then check in with the Admitting office.  Call this number if you have problems the morning of surgery:  [phone]   If you have any questions prior to your surgery date call [phone]: Open Monday-Friday 8am-4pm If you experience any cold or flu symptoms such as cough, fever, chills, shortness of breath, etc. between now and your scheduled surgery, please notify us at the above number     Remember:  Do not eat or drink after midnight the night before your surgery     Take these medicines the morning of surgery with A SIP OF WATER:  busPIRone (BUSPAR)  divalproex (DEPAKOTE)  DULoxetine (CYMBALTA)  gabapentin (NEURONTIN)  loratadine (CLARITIN)  Budeson-Glycopyrrol-Formoterol (BREZTRI AEROSPHERE)   IF NEEDED: albuterol (VENTOLIN HFA) inhaler- bring with you the day of surgery cyclobenzaprine (FLEXERIL)  hydrOXYzine (ATARAX)   Follow your surgeon's instructions on when to stop Aspirin.  If no instructions were given by your surgeon then you will need to call the office to get those instructions.    As of today, STOP taking any (unless otherwise instructed by your surgeon) Aleve, Naproxen, Ibuprofen, Motrin, Advil, Goody's, BC's, all herbal medications, fish oil, diclofenac Sodium (VOLTAREN) gel and all vitamins.             [facility] is not responsible for any belongings or valuables.    Do NOT Smoke (Tobacco/Vaping)  24 hours prior to your procedure  If you use a CPAP at night, you may bring your mask for your overnight stay.   Contacts, glasses, hearing aids, dentures or partials may not be worn into surgery, please bring cases for these belongings   For patients admitted to the hospital, discharge time will be determined by your treatment team.   Patients discharged the day of surgery will not be allowed to drive home, and someone  needs to stay with them for 24 hours.   SURGICAL WAITING ROOM VISITATION Patients having surgery or a procedure may have no more than 2 support people in the waiting area - these visitors may rotate.   Children under the age of 53 must have an adult with them who is not the patient. If the patient needs to stay at the hospital during part of their recovery, the visitor guidelines for inpatient rooms apply. Pre-op nurse will coordinate an appropriate time for 1 support person to accompany patient in pre-op.  This support person may not rotate.   Please refer to [email or web address] for the visitor guidelines for Inpatients (after your surgery is over and you are in a regular room).    Special instructions:    Oral Hygiene is also important to reduce your risk of infection.  Remember - BRUSH YOUR TEETH THE MORNING OF SURGERY WITH YOUR REGULAR TOOTHPASTE   [place]- Preparing For Surgery  Before surgery, you can play an important role. Because skin is not sterile, your skin needs to be as free of germs as possible. You can reduce the number of germs on your skin by washing with CHG (chlorahexidine gluconate) Soap before surgery.  CHG is an antiseptic cleaner which kills germs and bonds with the skin to continue killing germs even after washing.     Please do not use if you have an allergy to CHG or antibacterial soaps. If your skin becomes reddened/irritated stop using the CHG.  Do not shave (including  legs and underarms) for at least 48 hours prior to first CHG shower. It is OK to shave your face.  Please follow these instructions carefully.     Shower the NIGHT BEFORE SURGERY and the MORNING OF SURGERY with CHG Soap.   If you chose to wash your hair, wash your hair first as usual with your normal shampoo. After you shampoo, rinse your hair and body thoroughly to remove the shampoo.  Then [employer] and genitals (private parts) with your  normal soap and rinse thoroughly to remove soap.  After that Use CHG Soap as you would any other liquid soap. You can apply CHG directly to the skin and wash gently with a scrungie or a clean washcloth.   Apply the CHG Soap to your body ONLY FROM THE NECK DOWN.  Do not use on open wounds or open sores. Avoid contact with your eyes, ears, mouth and genitals (private parts). Wash Face and genitals (private parts)  with your normal soap.   Wash thoroughly, paying special attention to the area where your surgery will be performed.  Thoroughly rinse your body with warm water from the neck down.  DO NOT shower/wash with your normal soap after using and rinsing off the CHG Soap.  Pat yourself dry with a CLEAN TOWEL.  Wear CLEAN PAJAMAS to bed the night before surgery  Place CLEAN SHEETS on your bed the night before your surgery  DO NOT SLEEP WITH PETS.   Day of Surgery: Take a shower with CHG soap. Wear Clean/Comfortable clothing the morning of surgery Do not wear jewelry or makeup. Do not wear lotions, powders, cologne or deodorant. Men may shave face and neck. Do not bring valuables to the hospital. Do not wear nail polish, gel polish, artificial nails, or any other type of covering on natural nails (fingers and toes) If you have artificial nails or gel coating that need to be removed by a nail salon, please have this removed prior to surgery. Artificial nails or gel coating may interfere with anesthesia's ability to adequately monitor your vital signs. Remember to brush your teeth WITH YOUR REGULAR TOOTHPASTE.    If you received a COVID test during your pre-op visit, it is requested that you wear a mask when out in public, stay away from anyone that may not be feeling well, and notify your surgeon if you develop symptoms. If you have been in contact with anyone that has tested positive in the last 10 days, please notify your surgeon.    Please read over the following fact sheets that  you were given.

## 2022-08-11 NOTE — Progress Notes (Addendum)
Pt suicide assessment places patient at risk, d/t recent suicide attempt. Pt seen at behavioral health and medically cleared/discharged. Pt states has psychiatrist set up to see and classes to go to after surgery. His mom handles all of his medications which are in a locked cabinet at home. He is not having any suicidal thoughts or intents today. He is on new medication which he says is helping. He says that he does not need to talk to anyone about any of this today and he has his doctor who he can reach out to if he needs. Pt also noted to have recent cocaine use. Pt reports he has not used cocaine or marijuana since prior to being at behavioral health. Pt instructed not to use any recreational drugs again prior to surgery. Shelby Dubin PA notified while pt at PAT appointment.

## 2022-08-12 ENCOUNTER — Ambulatory Visit: Payer: Self-pay | Admitting: *Deleted

## 2022-08-12 ENCOUNTER — Telehealth: Payer: Medicaid Other | Admitting: Nurse Practitioner

## 2022-08-12 DIAGNOSIS — U071 COVID-19: Secondary | ICD-10-CM

## 2022-08-12 DIAGNOSIS — M961 Postlaminectomy syndrome, not elsewhere classified: Secondary | ICD-10-CM | POA: Insufficient documentation

## 2022-08-12 MED ORDER — BENZONATATE 100 MG PO CAPS: 100.0000 mg | ORAL_CAPSULE | Freq: Three times a day (TID) | ORAL | 0 refills | Status: DC | PRN

## 2022-08-12 MED ORDER — MOLNUPIRAVIR EUA 200MG CAPSULE: 4.0000 | ORAL_CAPSULE | Freq: Two times a day (BID) | ORAL | 0 refills | Status: AC

## 2022-08-12 NOTE — Telephone Encounter (Signed)
Noted  

## 2022-08-12 NOTE — Progress Notes (Signed)
Virtual Visit Consent   Keith Gibbs, you are scheduled for a virtual visit with a [facility] provider today. Just as with appointments in the office, your consent must be obtained to participate. Your consent will be active for this visit and any virtual visit you may have with one of our providers in the next 365 days. If you have a MyChart account, a copy of this consent can be sent to you electronically.  As this is a virtual visit, video technology does not allow for your provider to perform a traditional examination. This may limit your provider's ability to fully assess your condition. If your provider identifies any concerns that need to be evaluated in person or the need to arrange testing (such as labs, EKG, etc.), we will make arrangements to do so. Although advances in technology are sophisticated, we cannot ensure that it will always work on either your end or our end. If the connection with a video visit is poor, the visit may have to be switched to a telephone visit. With either a video or telephone visit, we are not always able to ensure that we have a secure connection.  By engaging in this virtual visit, you consent to the provision of healthcare and authorize for your insurance to be billed (if applicable) for the services provided during this visit. Depending on your insurance coverage, you may receive a charge related to this service.  I need to obtain your verbal consent now. Are you willing to proceed with your visit today? Keith Gibbs has provided verbal consent on 08/12/2022 for a virtual visit (video or telephone). Apolonio Schneiders, FNP  Date: 08/12/2022 11:02 AM  Virtual Visit via Video Note   I, Apolonio Schneiders, connected with  Keith Gibbs  ([id number], 11/13/1960) on 08/12/22 at 11:00 AM EST by a video-enabled telemedicine application and verified that I am speaking with the correct person using two identifiers.  Location: Patient: Virtual Visit Location Patient:  Home Provider: Virtual Visit Location Provider: Home Office   I discussed the limitations of evaluation and management by telemedicine and the availability of in person appointments. The patient expressed understanding and agreed to proceed.    History of Present Illness: Keith Gibbs is a 62 y.o. who identifies as a male who was assigned male at birth, and is being seen today for COVID.   He was recently in a mental health hospital  Started to have a scratchy throat 3-4 days ago while he was there  He now has a cough and a fever with runny nose and scratchy throat He has body aches He has a fever of 100.7  He was able to take a home test 2 hours ago and was positive  Lives with his mother currently who also has [place]    He has been vaccinated x2 for COVID Has not had COVID in the past   He does have a history of COPD, he has been using both of his inhalers and does not feel that his breathing has gotten any worse   GFR > 60   Problems:  Patient Active Problem List   Diagnosis Date Noted   MDD (major depressive disorder) 08/10/2022   Substance induced mood disorder ([place]) 08/06/2022   Suicide attempt by drug overdose ([place]) 08/06/2022   Toxic encephalopathy 08/03/2022   Suicidal ideation 08/03/2022   Encephalopathy 08/03/2022   Polysubstance abuse ([place]) 04/28/2022   Postoperative abdominal pain 02/28/2022   Acute cholecystitis 02/17/2022  Obstruction of nasal valve 01/07/2022   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   Pulmonary nodule 12/17/2021   CAD S/P percutaneous coronary angioplasty 12/17/2021   Epigastric abdominal pain    Nausea and vomiting    Tobacco dependence due to cigarettes 08/31/2021   Iron deficiency anemia secondary to inadequate dietary iron intake 08/31/2021   Acute lead-induced gout involving toe of left foot 08/31/2021   Encounter for lipid screening for cardiovascular disease 08/31/2021   Hypotension due to drugs 06/21/2021   Opioid withdrawal ([place]) 06/21/2021    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[place]) 06/21/2021   RLS (restless legs syndrome) 05/07/2021   Acquired deviated nasal septum 05/03/2021   Encounter for surveillance of abnormal nevi 05/03/2021   Flu vaccine need 05/03/2021   Substance abuse ([place]) 05/03/2021   Drug abuse and dependence ([place]) 05/03/2021   Acute midline low back pain without sciatica 05/03/2021   Need for shingles vaccine 05/03/2021   Right groin pain 01/29/2021   S/P TKR (total knee replacement) using cement, right 01/19/2021   Cigarette smoker 07/21/2020   Foot pain, bilateral 12/25/2019   Potential exposure to STD 12/12/2019   History of lumbar surgery 12/12/2019   Fatigue 12/12/2019   Penile lesion 12/12/2019   Right testicular pain 12/12/2019   Body mass index 28.0-28.9, adult 12/12/2019   History of COPD 12/12/2019   Tobacco use disorder, continuous 12/12/2019   Hepatoma ([place]) 11/05/2019   Anxiety 12/10/2018   Unstable angina ([place]) 11/30/2018   Essential hypertension 09/18/2018   Heart valve disorder 09/18/2018   Hypercholesteremia 09/18/2018   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   Chronic anticoagulation (Plavix) 04/19/2018   Pain medication agreement broken ([place]) 04/19/2018   Cocaine use 04/18/2018   Cocaine abuse ([place]) (See 04/12/18 UDS) 04/18/2018   Marijuana abuse (See 04/12/18 UDS) 04/18/2018   Long term current use of opiate analgesic 04/12/2018   Cirrhosis of liver ([place]) 04/03/2018   BPH (benign prostatic hyperplasia) 04/03/2018   Hematuria, microscopic 04/03/2018   Osteoarthritis of the knee (Left) 11/02/2017   Lumbar facet syndrome (Bilateral) 11/01/2017   Spondylosis without myelopathy or radiculopathy, lumbosacral region 11/01/2017   Osteoarthritis of shoulder (Right) 11/01/2017   Osteoarthritis of acromioclavicular joint (Right) 11/01/2017   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) 11/01/2017   Osteoarthritis 11/01/2017   Cervicalgia 11/01/2017   Cervical facet syndrome 11/01/2017   DDD  (degenerative disc disease), cervical 11/01/2017   DDD (degenerative disc disease), lumbar 11/01/2017   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) 09/18/2017   Other chronic pain 09/18/2017   Chronic musculoskeletal pain 09/18/2017   Chronic pain of right knee 09/11/2017   Chronic low back pain (Secondary Area of Pain) (Bilateral) 09/11/2017   Chronic hip pain (Tertiary Area of Pain) (Bilateral) (R>L) 09/11/2017   Chronic shoulder pain (Fourth Area of Pain) (Right) 09/11/2017   Spondylosis without myelopathy or radiculopathy, cervical region 09/11/2017   Chronic pain syndrome 09/11/2017   Opiate use 09/11/2017   Screen for STD (sexually transmitted disease) 09/11/2017   Disorder of skeletal system 09/11/2017   Problems influencing health status 09/11/2017   Overdose of medication, intentional self-harm, sequela ([place]) 12/28/2016   Severe recurrent major depression without psychotic features ([place]) 12/27/2016   Coronary artery disease of native artery of native heart with stable angina pectoris ([place]) 12/27/2016   Alcohol use disorder, moderate, dependence ([place]) 12/27/2016   Cannabis use disorder, moderate, dependence ([place]) 12/27/2016   Angina at rest 06/11/2016   Chest pain 06/10/2016   Bipolar 1 disorder ([place]) 05/27/2013   Abnormal  ejaculation 11/30/2012   Chest pain, unspecified 11/30/2012   Degenerative arthritis of hip 11/16/2012   Schizoaffective disorder ([place]) 06/27/2012   COPD (chronic obstructive pulmonary disease) ([place]) 06/27/2012   Chronic hepatitis C ([place]) 06/27/2012   GERD (gastroesophageal reflux disease) 06/27/2012   Complete rupture of rotator cuff 02/10/2012   Cocaine abuse in remission ([place]) 12/05/2011   Unspecified osteoarthritis, unspecified site 07/27/2011    Allergies:  Allergies  Allergen Reactions   Latuda [Lurasidone Hcl] Other (See Comments)    Tremors     Saphris [Asenapine] Other (See Comments)    Increased tremors    Medications:  Current  Outpatient Medications:    albuterol (VENTOLIN HFA) 108 (90 Base) MCG/ACT inhaler, Inhale 2 puffs into the lungs every 6 (six) hours as needed for wheezing or shortness of breath., Disp: 18 g, Rfl: 6   aspirin EC 81 MG tablet, Take 1 tablet (81 mg total) by mouth daily. Swallow whole., Disp: 30 tablet, Rfl: 12   atorvastatin (LIPITOR) 80 MG tablet, Take 1 tablet (80 mg total) by mouth at bedtime., Disp: 30 tablet, Rfl: 0   Budeson-Glycopyrrol-Formoterol (BREZTRI AEROSPHERE) 160-9-4.8 MCG/ACT AERO, Inhale 2 puffs into the lungs in the morning and at bedtime., Disp: , Rfl:    busPIRone (BUSPAR) 15 MG tablet, Take 1 tablet (15 mg total) by mouth daily., Disp: 60 tablet, Rfl: 3   cyclobenzaprine (FLEXERIL) 10 MG tablet, Take 1 tablet (10 mg total) by mouth 2 (two) times daily as needed for muscle spasms., Disp: 20 tablet, Rfl: 0   diclofenac Sodium (VOLTAREN) 1 % GEL, Apply 2 g topically 4 (four) times daily., Disp: 2 g, Rfl: 0   divalproex (DEPAKOTE) 500 MG DR tablet, Take 1 tablet (500 mg total) by mouth 2 (two) times daily., Disp: 60 tablet, Rfl: 3   DULoxetine (CYMBALTA) 30 MG capsule, Take 3 capsules (90 mg total) by mouth daily., Disp: 90 capsule, Rfl: 0   gabapentin (NEURONTIN) 300 MG capsule, Take 300 mg by mouth 3 (three) times daily., Disp: , Rfl:    hydrOXYzine (ATARAX) 25 MG tablet, Take 1 tablet (25 mg total) by mouth 3 (three) times daily as needed for anxiety., Disp: 15 tablet, Rfl: 0   loratadine (CLARITIN) 10 MG tablet, Take 1 tablet (10 mg total) by mouth daily., Disp: 30 tablet, Rfl: 0   nicotine (NICODERM CQ - DOSED IN MG/24 HOURS) 21 mg/24hr patch, Place 1 patch (21 mg total) onto the skin daily., Disp: 28 patch, Rfl: 0   rOPINIRole (REQUIP) 1 MG tablet, Take 1 tablet (1 mg total) by mouth at bedtime., Disp: 30 tablet, Rfl: 0   traZODone (DESYREL) 100 MG tablet, Take 1 tablet (100 mg total) by mouth at bedtime as needed for sleep., Disp: 30 tablet, Rfl:  0  Observations/Objective: Patient is well-developed, well-nourished in no acute distress.  Resting comfortably  at home.  Head is normocephalic, atraumatic.  No labored breathing.  Speech is clear and coherent with logical content.  Patient is alert and oriented at baseline.    Assessment and Plan: Instructed to call Neurosurgery about recent illness regarding upcoming surgery.  1. COVID-19  - molnupiravir EUA (LAGEVRIO) 200 mg CAPS capsule; Take 4 capsules (800 mg total) by mouth 2 (two) times daily for 5 days.  Dispense: 40 capsule; Refill: 0 - benzonatate (TESSALON) 100 MG capsule; Take 1 capsule (100 mg total) by mouth 3 (three) times daily as needed.  Dispense: 30 capsule; Refill: 0    Alternate tylenol and  ibuprofen for headache and fever  Push fluids and assure adequate protein intake    Follow Up Instructions: I discussed the assessment and treatment plan with the patient. The patient was provided an opportunity to ask questions and all were answered. The patient agreed with the plan and demonstrated an understanding of the instructions.  A copy of instructions were sent to the patient via MyChart unless otherwise noted below.    The patient was advised to call back or seek an in-person evaluation if the symptoms worsen or if the condition fails to improve as anticipated.  Time:  I spent 10 minutes with the patient via telehealth technology discussing the above problems/concerns.    Apolonio Schneiders, FNP

## 2022-08-12 NOTE — Telephone Encounter (Signed)
  Chief Complaint: + COVID Symptoms: cough, congestion, body aches, headache  Frequency: symptoms started 5 days ago Pertinent Negatives: Patient denies   Disposition: '[]'$ ED /'[]'$ Urgent Care (no appt availability in office) / '[]'$ Appointment(In office/virtual)/ '[x]'$  [facility]/ '[]'$ Home Care/ '[]'$ Refused Recommended Disposition /'[]'$ [facility]/ '[]'$  Follow-up with PCP Additional Notes: no open appointment- scheduled virtual UC, COVID protocol reviewed: isolation, treatment

## 2022-08-12 NOTE — Telephone Encounter (Signed)
Summary: Covid+   Patient states that he tested positive for Covid this morning. Patient has been experiencing a runny nose, fever, sore throat, cough and body aches for 5+ days.. Patient is requesting and anti-viral.       Reason for Disposition  [1] HIGH RISK patient (e.g., weak immune system, age > 14 years, obesity with BMI 30 or higher, pregnant, chronic lung disease or other chronic medical condition) AND [2] COVID symptoms (e.g., cough, fever)  (Exceptions: Already seen by PCP and no new or worsening symptoms.)  Answer Assessment - Initial Assessment Questions 1. COVID-19 DIAGNOSIS: "How do you know that you have COVID?" (e.g., positive lab test or self-test, diagnosed by doctor or NP/PA, symptoms after exposure).     _ [facility] home test-today 2. COVID-19 EXPOSURE: "Was there any known exposure to COVID before the symptoms began?" CDC Definition of close contact: within 6 feet (2 meters) for a total of 15 minutes or more over a 24-hour period.      Recent hospital 3. ONSET: "When did the COVID-19 symptoms start?"      5 days ago- patient was in mental health hospital 4. WORST SYMPTOM: "What is your worst symptom?" (e.g., cough, fever, shortness of breath, muscle aches)     Cough, congestion 5. COUGH: "Do you have a cough?" If Yes, ask: "How bad is the cough?"       Yes- coughing yellow sputum 6. FEVER: "Do you have a fever?" If Yes, ask: "What is your temperature, how was it measured, and when did it start?"     Yes- 100.7, forehead 7. RESPIRATORY STATUS: "Describe your breathing?" (e.g., normal; shortness of breath, wheezing, unable to speak)      COPD- mild SOB- not bad 8. BETTER-SAME-WORSE: "Are you getting better, staying the same or getting worse compared to yesterday?"  If getting worse, ask, "In what way?"     Worse- symptoms worse- body drained 9. OTHER SYMPTOMS: "Do you have any other symptoms?"  (e.g., chills, fatigue, headache, loss of smell or taste, muscle pain, sore  throat)     Headache, lack of energy 10. HIGH RISK DISEASE: "Do you have any chronic medical problems?" (e.g., asthma, heart or lung disease, weak immune system, obesity, etc.)       COPD, heart disease  Protocols used: Coronavirus (COVID-19) Diagnosed or Suspected-A-AH

## 2022-08-15 ENCOUNTER — Telehealth (HOSPITAL_COMMUNITY): Payer: Self-pay | Admitting: *Deleted

## 2022-08-15 NOTE — Telephone Encounter (Signed)
Patient called stated that he NOW has COVID & just recently released from  [facility] after  5 days . Wanted you know tha the med's you recently prescribed they took him off & wanted you to review his med list

## 2022-08-15 NOTE — Progress Notes (Addendum)
Anesthesia Chart Review:  Case: [id number] Date/Time: 08/23/22 0715   Procedures:      DLIF L23, L34 PRONE TRANSPSOAS, EXPLORE AND EXTEND FUSION L2-5, POSTERIOR DECOMPRESSION M46, LT O03     Application of O-Arm   Anesthesia type: General   Pre-op diagnosis: LUMBAR RADICULOPATHY   Location: [facility] OR ROOM 19 / [place] [facility]   Surgeons: Vallarie Mare, MD       DISCUSSION: Patient is a 62 year old male scheduled for the above procedure.  Surgery was initially scheduled for surgery on 08/17/22, but he tested positive for COVID-19 on 08/12/22. By notes, he had a scratchy throat 3-4 days prior but had since developed a runny nose and cough, with temperature 100.7. He felt breathing was at his baseline  and had been using his routine inhalers. He was prescribed monupiravir and as needed Tessalon. Surgery has tentatively been moved out 10 days post COVID diagnosis to 08/23/22, but office instructed him to call if he had not fully recovered from his symptoms by his new surgery date.  History includes smoking, COPD, CAD (MI, s/p DES mid LAD 06/09/16; PTCA for moderate ISR 12/04/19), valvular insuffiencey (mild MR, mild-moderate AI 12/2021), HLD, CVA, Bipolar 1 disorder, depression with SI/SA, polysubstance abuse (opioids, cocaine, alcohol, tobacco), Hepatitis C (2012), spinal surgery, colon surgery (small bowel resection for Meckel's diverticulum), osteoarthritis (right TKA 01/19/21), cholecystectomy (02/17/22).   Cardiologist is Dr. Neoma Laming at [facility] in [place]. Echo in June 2023 showed mild LVH, LVEF 76%, normal LV/RV systolic function, mild MR, mild-moderate AI. June 2023 nuclear stress test was concerning for ischemia in the RCA territory, so he underwent a CCTA on 01/31/22 that showed coronary calcium score of 546, luminal irregularities in the RCA and LCX, with LAD showing severely calcified mid portion and distal high grade lesion. He was not having chest pain, so continued medical therapy  recommended. Last visit was on 06/10/22 for follow-up and preoperative evaluation. On 06/10/22 cardiologist Dr. Humphrey Rolls wrote, "Patient optimized and cleared for back surgery." Return in four months planned. Engineer, drilling, FNP-C at that same practice classified him as "Moderate Risk" with permission to hold ASA for 7 days.   PCP Geryl Rankins, NP signed a medical clearance letter classifying him as "Low Risk" with permission to hold ASA 7 days prior to surgery and resume on POD #1.   Since then he did have a [facility] admission 08/05/22-08/10/22. Initially admitted 08/03/22-08/05/22 to [facility] for acute metabolic encephalopathy, likely Zyprexa OD. UDS also + cocaine, benzo. Head CT negative. Troponin negative. Discharged to [facility] after medically cleared. Improved with supportive psychotherapy and medication adjustments. Discharged with mother's support.  At 08/11/22, reported mood remained stable since discharge. He reported that he will refrain from illicit drug use prior to surgery. Nikki at Dr. Marcello Moores' office is aware of his recent behavioral health admission.   ADDENDUM 08/22/22 9:40 AM: I called and spoke with Mr. Sonoda. He says he feels recovered from COVID-19. No significant cough. No wheezing. He is using his usual inhalers. He says he also stopped smoking. Anesthesia team to evaluate on the day of surgery.   VS: BP 122/63   Pulse 68   Temp 36.7 C   Resp 18   Ht '6\' 3"'$  (1.905 m)   Wt 94.8 kg   SpO2 98%   BMI 26.11 kg/m    PROVIDERS: Gildardo Pounds, NP is PCP  Neoma Laming, MD is cardiologist ([place])  LABS: Most recent labs include:  Lab Results  Component Value Date   WBC 7.0 08/05/2022   HGB 12.7 (L) 08/05/2022   HCT 38.4 (L) 08/05/2022   PLT 175 08/05/2022   GLUCOSE 114 (H) 08/05/2022   ALT 18 08/05/2022   AST 22 08/05/2022   NA 139 08/05/2022   K 3.9 08/05/2022   CL 108 08/05/2022   CREATININE 0.99 08/05/2022   BUN 15 08/05/2022    CO2 24 08/05/2022   TSH 0.420 08/03/2022   HGBA1C 5.2 04/26/2022      IMAGES: CT Head 08/03/22: IMPRESSION: No acute intracranial abnormality.  MRI L-spine 03/02/22: IMPRESSION: Degenerative and postoperative changes as detailed above. 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 Increased moderate canal stenosis at this level. Otherwise stable appearance including moderate to marked canal stenosis at L3-L4.    EKG: 08/04/22:  Sinus bradycardia at 48 bpm Otherwise normal ECG When compared with ECG of 03-Aug-2022 09:20, PREVIOUS ECG IS PRESENT Since last tracing rate slower Confirmed by Fransico Him ([id number]) on 08/04/2022 1:35:48 PM   CV: CT Cardiac 01/31/22 ([facility]): Conclusions: Quality of Study: Good. Calcium Score: 546 Right dominant system. RCA has minor irregularities. LCX has minor luminal irregularities. LAD has severely calcified mid portion with distal high grade lesion. Consider cath if having chest pain.  - No chest pain, so continued medical therapy planned per Dr. Humphrey Rolls at that time.    Echo 01/14/22 ([facility]): EF 76%. Normal chamber sizes. Normal left ventricular systolic function. Normal right ventricular systolic and diastolic function. Normal left ventricular wall motion. Normal right ventricular wall motion. Trace pulmonary regurgitation. Trace tricuspid regurgitation. Normal pulmonary artery pressure. Mild mitral regurgitation. Mild to moderate aortic regurgitation.   No pericardial effusion. Mildly dilated left atrium. Mild LVH.   Nuclear stress test 01/05/22 ([facility]): EF 62%. Large severe reversible basal, mid apical inferior and apex wall defects, normal wall motion.  Impression: Ischemia in the RCA territory and normal LVEF, advised CCTA.   Cardiac cath  12/04/19: Previously placed Mid LAD drug eluting stent is widely patent. Balloon angioplasty was performed. Prox LAD to Mid LAD lesion is 60% stenosed. The left ventricular systolic function is normal. LV end diastolic pressure is normal. The left ventricular ejection fraction is greater than 65% by visual estimate. 1. Single vessel CAD with moderate in-stent restenosis in the LAD, negative pressure wire evaluation 2. Patent left main, LCx, and RCA without significant stenosis 3. Normal LV function, LVEF 65% Recommend: medical therapy, tobacco cessation    Past Medical History:  Diagnosis Date   Anginal pain ([place])    Anxiety    Arthritis    Bipolar 1 disorder (H[facility])    Bursitis    CAD (coronary artery disease)    Chronic pain    COPD (chronic obstructive pulmonary disease) (H[facility])    Current use of long term anticoagulation    DAPT (ASA + clopidogrel)   Depression    Diverticulitis    Dyspnea    GERD (gastroesophageal reflux disease)    Grade I diastolic dysfunction    Hepatitis C 2012   No longer has Hep C   HLD (hyperlipidemia)    Hypertension    MI (myocardial infarction) ([place])    Polysubstance abuse (HCC)    cocaine, marijuana, ETOH   PUD (peptic ulcer disease)    S/P angioplasty with stent 06/10/2016   a.) 90% stenosis of  pLAD to mLAD - 2.5 x 18 mm Xience Alpine (DES x 1) placed to pLAD   S/P PTCA (percutaneous transluminal coronary angioplasty) 12/04/2019   a.) 60% in stent restenosis of DES to pLAD; LVEF 65%.   Schizophrenia ([place])    Stroke [facility])    Valvular insufficiency    a.) Mild MR, TR, PR; mild to moderate AR on 03/05/2018 TTE    Past Surgical History:  Procedure Laterality Date   ABDOMINAL SURGERY     removed small piece of intestines due to [facility] Diverticulosis   APPENDECTOMY     BACK SURGERY     CARDIAC CATHETERIZATION Left 06/10/2016   Procedure: Left Heart Cath and Coronary Angiography;  Surgeon: Dionisio David, MD;  Location: [place] CV  LAB;  Service: Cardiovascular;  Laterality: Left;   CARDIAC CATHETERIZATION N/A 06/10/2016   Procedure: Coronary Stent Intervention;  Surgeon: Yolonda Kida, MD;  Location: [place] CV LAB;  Service: Cardiovascular;  Laterality: N/A;   CHOLECYSTECTOMY N/A 02/17/2022   Procedure: LAPAROSCOPIC CHOLECYSTECTOMY;  Surgeon: Kieth Brightly Arta Bruce, MD;  Location: [place];  Service: General;  Laterality: N/A;   COLON SURGERY     COLONOSCOPY     COLONOSCOPY WITH PROPOFOL N/A 01/05/2017   Procedure: COLONOSCOPY WITH PROPOFOL;  Surgeon: Jonathon Bellows, MD;  Location: [facility] ENDOSCOPY;  Service: Endoscopy;  Laterality: N/A;   COLONOSCOPY WITH PROPOFOL N/A 02/13/2020   Procedure: COLONOSCOPY WITH PROPOFOL;  Surgeon: Virgel Manifold, MD;  Location: [facility] ENDOSCOPY;  Service: Endoscopy;  Laterality: N/A;   CORONARY ANGIOPLASTY WITH STENT PLACEMENT     ESOPHAGOGASTRODUODENOSCOPY (EGD) WITH PROPOFOL N/A 01/05/2017   Procedure: ESOPHAGOGASTRODUODENOSCOPY (EGD) WITH PROPOFOL;  Surgeon: Jonathon Bellows, MD;  Location: [facility] ENDOSCOPY;  Service: Endoscopy;  Laterality: N/A;   ESOPHAGOGASTRODUODENOSCOPY (EGD) WITH PROPOFOL N/A 02/13/2020   Procedure: ESOPHAGOGASTRODUODENOSCOPY (EGD) WITH PROPOFOL;  Surgeon: Virgel Manifold, MD;  Location: [facility] ENDOSCOPY;  Service: Endoscopy;  Laterality: N/A;   INTRAVASCULAR PRESSURE WIRE/FFR STUDY N/A 12/04/2019   Procedure: INTRAVASCULAR PRESSURE WIRE/FFR STUDY;  Surgeon: Sherren Mocha, MD;  Location: [place] CV LAB;  Service: Cardiovascular;  Laterality: N/A;   KNEE ARTHROSCOPY WITH MEDIAL MENISECTOMY Right 09/05/2017   Procedure: KNEE ARTHROSCOPY WITH MEDIAL AND LATERAL  MENISECTOMY PARTIAL SYNOVECTOMY;  Surgeon: Hessie Knows, MD;  Location: [facility] ORS;  Service: Orthopedics;  Laterality: Right;   LEFT HEART CATH AND CORONARY ANGIOGRAPHY N/A 12/04/2019   Procedure: LEFT HEART CATH AND CORONARY ANGIOGRAPHY;  Surgeon: Sherren Mocha, MD;  Location: [place] CV LAB;   Service: Cardiovascular;  Laterality: N/A;   SHOULDER SURGERY Right 04/09/2012   SPINE SURGERY     TOTAL KNEE ARTHROPLASTY Right 01/19/2021   Procedure: TOTAL KNEE ARTHROPLASTY - Rachelle Hora to Assist;  Surgeon: Hessie Knows, MD;  Location: [facility] ORS;  Service: Orthopedics;  Laterality: Right;    MEDICATIONS:  albuterol (VENTOLIN HFA) 108 (90 Base) MCG/ACT inhaler   aspirin EC 81 MG tablet   atorvastatin (LIPITOR) 80 MG tablet   benzonatate (TESSALON) 100 MG capsule   Budeson-Glycopyrrol-Formoterol (BREZTRI AEROSPHERE) 160-9-4.8 MCG/ACT AERO   busPIRone (BUSPAR) 15 MG tablet   cyclobenzaprine (FLEXERIL) 10 MG tablet   diclofenac Sodium (VOLTAREN) 1 % GEL   divalproex (DEPAKOTE) 500 MG DR tablet   DULoxetine (CYMBALTA) 30 MG capsule   fluticasone (FLONASE) 50 MCG/ACT nasal spray   gabapentin (NEURONTIN) 300 MG capsule   hydrOXYzine (ATARAX) 25 MG tablet   loratadine (CLARITIN) 10 MG tablet   molnupiravir EUA (LAGEVRIO) 200 mg CAPS  capsule   nicotine (NICODERM CQ - DOSED IN MG/24 HOURS) 21 mg/24hr patch   rOPINIRole (REQUIP) 1 MG tablet   traZODone (DESYREL) 100 MG tablet   No current facility-administered medications for this encounter.    Myra Gianotti, PA-C Surgical Short Stay/Anesthesiology [facility] Phone [phone] [facility] Phone [phone] 08/16/2022 4:51 PM

## 2022-08-16 ENCOUNTER — Other Ambulatory Visit (HOSPITAL_COMMUNITY): Payer: Self-pay | Admitting: Psychiatry

## 2022-08-16 DIAGNOSIS — F411 Generalized anxiety disorder: Secondary | ICD-10-CM

## 2022-08-16 DIAGNOSIS — F250 Schizoaffective disorder, bipolar type: Secondary | ICD-10-CM

## 2022-08-16 MED ORDER — DULOXETINE HCL 30 MG PO CPEP: 90.0000 mg | ORAL_CAPSULE | Freq: Every day | ORAL | 3 refills | Status: DC

## 2022-08-16 MED ORDER — GABAPENTIN 300 MG PO CAPS: 300.0000 mg | ORAL_CAPSULE | Freq: Three times a day (TID) | ORAL | 3 refills | Status: DC

## 2022-08-16 MED ORDER — BUSPIRONE HCL 15 MG PO TABS: 15.0000 mg | ORAL_TABLET | Freq: Every day | ORAL | 3 refills | Status: DC

## 2022-08-16 MED ORDER — HYDROXYZINE HCL 25 MG PO TABS: 25.0000 mg | ORAL_TABLET | Freq: Three times a day (TID) | ORAL | 3 refills | Status: DC | PRN

## 2022-08-16 MED ORDER — DIVALPROEX SODIUM 500 MG PO DR TAB: 500.0000 mg | DELAYED_RELEASE_TABLET | Freq: Two times a day (BID) | ORAL | 3 refills | Status: DC

## 2022-08-16 MED ORDER — TRAZODONE HCL 100 MG PO TABS: 100.0000 mg | ORAL_TABLET | Freq: Every evening | ORAL | 3 refills | Status: DC | PRN

## 2022-08-16 NOTE — Anesthesia Preprocedure Evaluation (Addendum)
Anesthesia Evaluation  Patient identified by MRN, date of birth, ID band Patient awake    Reviewed: Allergy & Precautions, H&P , NPO status , Patient's Chart, lab work & pertinent test results  Airway Mallampati: II  TM Distance: >3 FB Neck ROM: Full    Dental no notable dental hx. (+) Edentulous Upper, Edentulous Lower, Dental Advisory Given   Pulmonary COPD,  COPD inhaler, Current Smoker and Patient abstained from smoking.   Pulmonary exam normal breath sounds clear to auscultation       Cardiovascular Exercise Tolerance: Good hypertension, Pt. on medications + CAD and + Cardiac Stents  + Valvular Problems/Murmurs AI and MR  Rhythm:Regular Rate:Normal     Neuro/Psych   Anxiety Depression Bipolar Disorder   negative neurological ROS     GI/Hepatic PUD,GERD  ,,(+)     substance abuse  alcohol use and cocaine use  Endo/Other  negative endocrine ROS    Renal/GU negative Renal ROS  negative genitourinary   Musculoskeletal  (+) Arthritis , Osteoarthritis,    Abdominal   Peds  Hematology  (+) Blood dyscrasia, anemia   Anesthesia Other Findings   Reproductive/Obstetrics negative OB ROS                             Anesthesia Physical Anesthesia Plan  ASA: 3  Anesthesia Plan: General   Post-op Pain Management: Tylenol PO (pre-op)* and Precedex   Induction: Intravenous  PONV Risk Score and Plan: 2 and Ondansetron, Dexamethasone and Midazolam  Airway Management Planned: Oral ETT  Additional Equipment: Arterial line  Intra-op Plan:   Post-operative Plan: Extubation in OR  Informed Consent: I have reviewed the patients History and Physical, chart, labs and discussed the procedure including the risks, benefits and alternatives for the proposed anesthesia with the patient or authorized representative who has indicated his/her understanding and acceptance.     Dental advisory  given  Plan Discussed with: CRNA  Anesthesia Plan Comments: (See PAT note written by Myra Gianotti, PA-C.  )       Anesthesia Quick Evaluation

## 2022-08-16 NOTE — Telephone Encounter (Signed)
Patient informed Probation officer that he has Covid and unable to come into the clinic for re-enty follow up at this time. He request a  bridge of medications until his follow up visit. Medications refilled and sent to preferred pharmacy.

## 2022-08-22 ENCOUNTER — Other Ambulatory Visit: Payer: Self-pay | Admitting: Neurosurgery

## 2022-08-23 ENCOUNTER — Encounter (HOSPITAL_COMMUNITY): Payer: Self-pay

## 2022-08-23 ENCOUNTER — Other Ambulatory Visit: Payer: Self-pay

## 2022-08-23 ENCOUNTER — Inpatient Hospital Stay (HOSPITAL_COMMUNITY): Payer: Medicaid Other

## 2022-08-23 ENCOUNTER — Inpatient Hospital Stay (HOSPITAL_COMMUNITY)
Admission: RE | Admit: 2022-08-23 | Discharge: 2022-08-26 | DRG: 455 | Disposition: A | Discharge status: Home / Self Care | Payer: Medicaid Other | Source: Ambulatory Visit | Attending: Neurosurgery | Admitting: Neurosurgery

## 2022-08-23 ENCOUNTER — Inpatient Hospital Stay (HOSPITAL_COMMUNITY): Payer: Medicaid Other | Admitting: Vascular Surgery

## 2022-08-23 ENCOUNTER — Inpatient Hospital Stay (HOSPITAL_COMMUNITY)
Admission: RE | Disposition: A | Discharge status: Home / Self Care | Payer: Self-pay | Source: Ambulatory Visit | Attending: Neurosurgery

## 2022-08-23 DIAGNOSIS — Z96651 Presence of right artificial knee joint: Secondary | ICD-10-CM | POA: Diagnosis present

## 2022-08-23 DIAGNOSIS — Z7951 Long term (current) use of inhaled steroids: Secondary | ICD-10-CM | POA: Diagnosis not present

## 2022-08-23 DIAGNOSIS — I10 Essential (primary) hypertension: Secondary | ICD-10-CM | POA: Diagnosis present

## 2022-08-23 DIAGNOSIS — M4316 Spondylolisthesis, lumbar region: Secondary | ICD-10-CM

## 2022-08-23 DIAGNOSIS — M5136 Other intervertebral disc degeneration, lumbar region: Secondary | ICD-10-CM | POA: Diagnosis present

## 2022-08-23 DIAGNOSIS — Z8711 Personal history of peptic ulcer disease: Secondary | ICD-10-CM

## 2022-08-23 DIAGNOSIS — F319 Bipolar disorder, unspecified: Secondary | ICD-10-CM | POA: Diagnosis present

## 2022-08-23 DIAGNOSIS — N400 Enlarged prostate without lower urinary tract symptoms: Secondary | ICD-10-CM | POA: Diagnosis present

## 2022-08-23 DIAGNOSIS — F17210 Nicotine dependence, cigarettes, uncomplicated: Secondary | ICD-10-CM | POA: Diagnosis present

## 2022-08-23 DIAGNOSIS — Z8261 Family history of arthritis: Secondary | ICD-10-CM

## 2022-08-23 DIAGNOSIS — M7138 Other bursal cyst, other site: Secondary | ICD-10-CM

## 2022-08-23 DIAGNOSIS — G2581 Restless legs syndrome: Secondary | ICD-10-CM | POA: Diagnosis present

## 2022-08-23 DIAGNOSIS — M48061 Spinal stenosis, lumbar region without neurogenic claudication: Secondary | ICD-10-CM | POA: Diagnosis present

## 2022-08-23 DIAGNOSIS — G894 Chronic pain syndrome: Secondary | ICD-10-CM | POA: Diagnosis present

## 2022-08-23 DIAGNOSIS — Z8249 Family history of ischemic heart disease and other diseases of the circulatory system: Secondary | ICD-10-CM

## 2022-08-23 DIAGNOSIS — E7800 Pure hypercholesterolemia, unspecified: Secondary | ICD-10-CM | POA: Diagnosis present

## 2022-08-23 DIAGNOSIS — J449 Chronic obstructive pulmonary disease, unspecified: Secondary | ICD-10-CM | POA: Diagnosis present

## 2022-08-23 DIAGNOSIS — Z79899 Other long term (current) drug therapy: Secondary | ICD-10-CM | POA: Diagnosis not present

## 2022-08-23 DIAGNOSIS — I2510 Atherosclerotic heart disease of native coronary artery without angina pectoris: Secondary | ICD-10-CM | POA: Diagnosis present

## 2022-08-23 DIAGNOSIS — K219 Gastro-esophageal reflux disease without esophagitis: Secondary | ICD-10-CM | POA: Diagnosis present

## 2022-08-23 DIAGNOSIS — Z981 Arthrodesis status: Secondary | ICD-10-CM | POA: Diagnosis present

## 2022-08-23 DIAGNOSIS — F419 Anxiety disorder, unspecified: Secondary | ICD-10-CM | POA: Diagnosis present

## 2022-08-23 DIAGNOSIS — Z23 Encounter for immunization: Secondary | ICD-10-CM

## 2022-08-23 DIAGNOSIS — Z888 Allergy status to other drugs, medicaments and biological substances status: Secondary | ICD-10-CM | POA: Diagnosis not present

## 2022-08-23 DIAGNOSIS — M5416 Radiculopathy, lumbar region: Secondary | ICD-10-CM | POA: Diagnosis present

## 2022-08-23 DIAGNOSIS — Z7982 Long term (current) use of aspirin: Secondary | ICD-10-CM | POA: Diagnosis not present

## 2022-08-23 DIAGNOSIS — Z818 Family history of other mental and behavioral disorders: Secondary | ICD-10-CM | POA: Diagnosis not present

## 2022-08-23 DIAGNOSIS — Z01818 Encounter for other preprocedural examination: Secondary | ICD-10-CM

## 2022-08-23 DIAGNOSIS — Z9861 Coronary angioplasty status: Secondary | ICD-10-CM

## 2022-08-23 HISTORY — PX: ANTERIOR LAT LUMBAR FUSION: SHX1168

## 2022-08-23 LAB — POCT I-STAT 7, (LYTES, BLD GAS, ICA,H+H)
Acid-Base Excess: 0 mmol/L (ref 0.0–2.0)
Bicarbonate: 26.5 mmol/L (ref 20.0–28.0)
Calcium, Ion: 1.12 mmol/L — ABNORMAL LOW (ref 1.15–1.40)
HCT: 36 % — ABNORMAL LOW (ref 39.0–52.0)
Hemoglobin: 12.2 g/dL — ABNORMAL LOW (ref 13.0–17.0)
O2 Saturation: 100 %
Patient temperature: 36.7
Potassium: 4.8 mmol/L (ref 3.5–5.1)
Sodium: 135 mmol/L (ref 135–145)
TCO2: 28 mmol/L (ref 22–32)
pCO2 arterial: 47.3 mmHg (ref 32–48)
pH, Arterial: 7.355 (ref 7.35–7.45)
pO2, Arterial: 277 mmHg — ABNORMAL HIGH (ref 83–108)

## 2022-08-23 SURGERY — ANTERIOR LATERAL LUMBAR FUSION 2 LEVELS
Anesthesia: General

## 2022-08-23 MED ORDER — CEFAZOLIN SODIUM-DEXTROSE 1-4 GM/50ML-% IV SOLN
1.0000 g | Freq: Three times a day (TID) | INTRAVENOUS | Status: AC
  Administered 2022-08-23 – 2022-08-24 (×3): 1 g via INTRAVENOUS
  Filled 2022-08-23 (×4): qty 50

## 2022-08-23 MED ORDER — OXYCODONE HCL 5 MG PO TABS
10.0000 mg | ORAL_TABLET | ORAL | Status: DC | PRN
  Administered 2022-08-24 – 2022-08-26 (×7): 10 mg via ORAL
  Filled 2022-08-23 (×7): qty 2

## 2022-08-23 MED ORDER — CHLORHEXIDINE GLUCONATE CLOTH 2 % EX PADS: 6.0000 | MEDICATED_PAD | Freq: Once | CUTANEOUS | Status: DC

## 2022-08-23 MED ORDER — VASOPRESSIN 20 UNIT/ML IV SOLN
INTRAVENOUS | Status: AC
  Filled 2022-08-23: qty 1

## 2022-08-23 MED ORDER — DEXMEDETOMIDINE HCL IN NACL 80 MCG/20ML IV SOLN
INTRAVENOUS | Status: DC | PRN
  Administered 2022-08-23 (×3): 4 ug via BUCCAL

## 2022-08-23 MED ORDER — UMECLIDINIUM BROMIDE 62.5 MCG/ACT IN AEPB
1.0000 | INHALATION_SPRAY | Freq: Every day | RESPIRATORY_TRACT | Status: DC
  Administered 2022-08-24 – 2022-08-26 (×3): 1 via RESPIRATORY_TRACT
  Filled 2022-08-23: qty 7

## 2022-08-23 MED ORDER — ROPINIROLE HCL 1 MG PO TABS
1.0000 mg | ORAL_TABLET | Freq: Every day | ORAL | Status: DC
  Administered 2022-08-23 – 2022-08-25 (×3): 1 mg via ORAL
  Filled 2022-08-23 (×3): qty 1

## 2022-08-23 MED ORDER — DULOXETINE HCL 60 MG PO CPEP
90.0000 mg | ORAL_CAPSULE | Freq: Every day | ORAL | Status: DC
  Administered 2022-08-24 – 2022-08-26 (×3): 90 mg via ORAL
  Filled 2022-08-23 (×3): qty 1

## 2022-08-23 MED ORDER — SODIUM CHLORIDE 0.9 % IV SOLN
INTRAVENOUS | Status: DC | PRN
  Administered 2022-08-23: 30 mL

## 2022-08-23 MED ORDER — VASOPRESSIN 20 UNITS/100 ML INFUSION FOR SHOCK
INTRAVENOUS | Status: DC | PRN
  Administered 2022-08-23: .03 [IU]/min via INTRAVENOUS

## 2022-08-23 MED ORDER — KETAMINE HCL 50 MG/5ML IJ SOSY
PREFILLED_SYRINGE | INTRAMUSCULAR | Status: AC
  Filled 2022-08-23: qty 10

## 2022-08-23 MED ORDER — ONDANSETRON HCL 4 MG/2ML IJ SOLN
INTRAMUSCULAR | Status: DC | PRN
  Administered 2022-08-23: 4 mg via INTRAVENOUS

## 2022-08-23 MED ORDER — CHLORHEXIDINE GLUCONATE 0.12 % MT SOLN
15.0000 mL | Freq: Once | OROMUCOSAL | Status: AC
  Administered 2022-08-23: 15 mL via OROMUCOSAL
  Filled 2022-08-23: qty 15

## 2022-08-23 MED ORDER — PHENOL 1.4 % MT LIQD: 1.0000 | OROMUCOSAL | Status: DC | PRN

## 2022-08-23 MED ORDER — GLYCOPYRROLATE PF 0.2 MG/ML IJ SOSY
PREFILLED_SYRINGE | INTRAMUSCULAR | Status: DC | PRN
  Administered 2022-08-23 (×2): .2 mg via INTRAVENOUS

## 2022-08-23 MED ORDER — BUPIVACAINE HCL (PF) 0.5 % IJ SOLN
INTRAMUSCULAR | Status: DC | PRN
  Administered 2022-08-23: 30 mL

## 2022-08-23 MED ORDER — DIVALPROEX SODIUM 250 MG PO DR TAB
500.0000 mg | DELAYED_RELEASE_TABLET | Freq: Two times a day (BID) | ORAL | Status: DC
  Administered 2022-08-23 – 2022-08-26 (×6): 500 mg via ORAL
  Filled 2022-08-23 (×7): qty 2

## 2022-08-23 MED ORDER — LACTATED RINGERS IV SOLN: INTRAVENOUS | Status: DC | PRN

## 2022-08-23 MED ORDER — HYDROMORPHONE HCL 1 MG/ML IJ SOLN
0.2500 mg | INTRAMUSCULAR | Status: DC | PRN
  Administered 2022-08-23 (×3): 0.5 mg via INTRAVENOUS
  Administered 2022-08-23 (×2): 0.25 mg via INTRAVENOUS

## 2022-08-23 MED ORDER — FENTANYL CITRATE (PF) 250 MCG/5ML IJ SOLN
INTRAMUSCULAR | Status: DC | PRN
  Administered 2022-08-23: 50 ug via INTRAVENOUS
  Administered 2022-08-23: 100 ug via INTRAVENOUS
  Administered 2022-08-23 (×7): 50 ug via INTRAVENOUS

## 2022-08-23 MED ORDER — ATROPINE SULFATE 0.4 MG/ML IV SOLN
INTRAVENOUS | Status: DC | PRN
  Administered 2022-08-23: .1 mg via INTRAVENOUS
  Administered 2022-08-23: .2 mg via INTRAVENOUS
  Administered 2022-08-23: .1 mg via INTRAVENOUS

## 2022-08-23 MED ORDER — VASOPRESSIN 20 UNIT/ML IV SOLN
INTRAVENOUS | Status: DC | PRN
  Administered 2022-08-23 (×4): 1 [IU] via INTRAVENOUS

## 2022-08-23 MED ORDER — THROMBIN 5000 UNITS EX SOLR
CUTANEOUS | Status: AC
  Filled 2022-08-23: qty 5000

## 2022-08-23 MED ORDER — GABAPENTIN 300 MG PO CAPS
300.0000 mg | ORAL_CAPSULE | Freq: Three times a day (TID) | ORAL | Status: DC
  Administered 2022-08-23 – 2022-08-26 (×8): 300 mg via ORAL
  Filled 2022-08-23 (×8): qty 1

## 2022-08-23 MED ORDER — LACTATED RINGERS IV SOLN: INTRAVENOUS | Status: DC

## 2022-08-23 MED ORDER — MIDAZOLAM HCL 2 MG/2ML IJ SOLN
INTRAMUSCULAR | Status: AC
  Filled 2022-08-23: qty 2

## 2022-08-23 MED ORDER — MIDAZOLAM HCL 2 MG/2ML IJ SOLN
INTRAMUSCULAR | Status: DC | PRN
  Administered 2022-08-23: 1 mg via INTRAVENOUS
  Administered 2022-08-23: 2 mg via INTRAVENOUS
  Administered 2022-08-23: 1 mg via INTRAVENOUS

## 2022-08-23 MED ORDER — LIDOCAINE-EPINEPHRINE 1 %-1:100000 IJ SOLN
INTRAMUSCULAR | Status: AC
  Filled 2022-08-23: qty 1

## 2022-08-23 MED ORDER — SODIUM CHLORIDE 0.9 % IV SOLN: 250.0000 mL | INTRAVENOUS | Status: DC

## 2022-08-23 MED ORDER — CYCLOBENZAPRINE HCL 10 MG PO TABS
10.0000 mg | ORAL_TABLET | Freq: Three times a day (TID) | ORAL | Status: DC | PRN
  Administered 2022-08-24 – 2022-08-25 (×3): 10 mg via ORAL
  Filled 2022-08-23 (×4): qty 1

## 2022-08-23 MED ORDER — SUCCINYLCHOLINE CHLORIDE 200 MG/10ML IV SOSY
PREFILLED_SYRINGE | INTRAVENOUS | Status: DC | PRN
  Administered 2022-08-23: 120 mg via INTRAVENOUS

## 2022-08-23 MED ORDER — PROPOFOL 10 MG/ML IV BOLUS
INTRAVENOUS | Status: DC | PRN
  Administered 2022-08-23: 130 mg via INTRAVENOUS
  Administered 2022-08-23: 20 mg via INTRAVENOUS

## 2022-08-23 MED ORDER — MENTHOL 3 MG MT LOZG: 1.0000 | LOZENGE | OROMUCOSAL | Status: DC | PRN

## 2022-08-23 MED ORDER — BUPIVACAINE LIPOSOME 1.3 % IJ SUSP: INTRAMUSCULAR | Status: DC | PRN

## 2022-08-23 MED ORDER — ATORVASTATIN CALCIUM 80 MG PO TABS
80.0000 mg | ORAL_TABLET | Freq: Every day | ORAL | Status: DC
  Administered 2022-08-23 – 2022-08-25 (×3): 80 mg via ORAL
  Filled 2022-08-23 (×5): qty 1

## 2022-08-23 MED ORDER — 0.9 % SODIUM CHLORIDE (POUR BTL) OPTIME
TOPICAL | Status: DC | PRN
  Administered 2022-08-23: 1000 mL

## 2022-08-23 MED ORDER — ACETAMINOPHEN 650 MG RE SUPP: 650.0000 mg | RECTAL | Status: DC | PRN

## 2022-08-23 MED ORDER — DOCUSATE SODIUM 100 MG PO CAPS
100.0000 mg | ORAL_CAPSULE | Freq: Two times a day (BID) | ORAL | Status: DC
  Administered 2022-08-23 – 2022-08-26 (×6): 100 mg via ORAL
  Filled 2022-08-23 (×6): qty 1

## 2022-08-23 MED ORDER — INFLUENZA VAC A&B SA ADJ QUAD 0.5 ML IM PRSY
0.5000 mL | PREFILLED_SYRINGE | INTRAMUSCULAR | Status: AC
  Administered 2022-08-24: 0.5 mL via INTRAMUSCULAR
  Filled 2022-08-23: qty 0.5

## 2022-08-23 MED ORDER — BENZONATATE 100 MG PO CAPS: 100.0000 mg | ORAL_CAPSULE | Freq: Three times a day (TID) | ORAL | Status: DC | PRN

## 2022-08-23 MED ORDER — FENTANYL CITRATE (PF) 250 MCG/5ML IJ SOLN
INTRAMUSCULAR | Status: AC
  Filled 2022-08-23: qty 5

## 2022-08-23 MED ORDER — LIDOCAINE 2% (20 MG/ML) 5 ML SYRINGE
INTRAMUSCULAR | Status: DC | PRN
  Administered 2022-08-23: 60 mg via INTRAVENOUS

## 2022-08-23 MED ORDER — PROPOFOL 1000 MG/100ML IV EMUL
INTRAVENOUS | Status: AC
  Filled 2022-08-23: qty 300

## 2022-08-23 MED ORDER — MOMETASONE FURO-FORMOTEROL FUM 200-5 MCG/ACT IN AERO
2.0000 | INHALATION_SPRAY | Freq: Two times a day (BID) | RESPIRATORY_TRACT | Status: DC
  Administered 2022-08-24 – 2022-08-26 (×5): 2 via RESPIRATORY_TRACT
  Filled 2022-08-23: qty 8.8

## 2022-08-23 MED ORDER — PROPOFOL 500 MG/50ML IV EMUL
INTRAVENOUS | Status: DC | PRN
  Administered 2022-08-23: 25 ug/kg/min via INTRAVENOUS

## 2022-08-23 MED ORDER — ONDANSETRON HCL 4 MG PO TABS: 4.0000 mg | ORAL_TABLET | Freq: Four times a day (QID) | ORAL | Status: DC | PRN

## 2022-08-23 MED ORDER — EPHEDRINE SULFATE-NACL 50-0.9 MG/10ML-% IV SOSY
PREFILLED_SYRINGE | INTRAVENOUS | Status: DC | PRN
  Administered 2022-08-23 (×3): 10 mg via INTRAVENOUS
  Administered 2022-08-23: 5 mg via INTRAVENOUS
  Administered 2022-08-23: 10 mg via INTRAVENOUS
  Administered 2022-08-23: 5 mg via INTRAVENOUS

## 2022-08-23 MED ORDER — BUPIVACAINE LIPOSOME 1.3 % IJ SUSP
INTRAMUSCULAR | Status: AC
  Filled 2022-08-23: qty 20

## 2022-08-23 MED ORDER — HYDROXYZINE HCL 25 MG PO TABS
25.0000 mg | ORAL_TABLET | Freq: Three times a day (TID) | ORAL | Status: DC | PRN
  Administered 2022-08-23 – 2022-08-26 (×4): 25 mg via ORAL
  Filled 2022-08-23 (×4): qty 1

## 2022-08-23 MED ORDER — HYDROMORPHONE HCL 1 MG/ML IJ SOLN
INTRAMUSCULAR | Status: AC
  Filled 2022-08-23: qty 1

## 2022-08-23 MED ORDER — VANCOMYCIN HCL 1000 MG IV SOLR
INTRAVENOUS | Status: AC
  Filled 2022-08-23: qty 20

## 2022-08-23 MED ORDER — VANCOMYCIN HCL 1000 MG IV SOLR
INTRAVENOUS | Status: DC | PRN
  Administered 2022-08-23: 1000 mg

## 2022-08-23 MED ORDER — ACETAMINOPHEN 325 MG PO TABS
650.0000 mg | ORAL_TABLET | ORAL | Status: DC | PRN
  Administered 2022-08-24 – 2022-08-26 (×4): 650 mg via ORAL
  Filled 2022-08-23 (×4): qty 2

## 2022-08-23 MED ORDER — ACETAMINOPHEN 10 MG/ML IV SOLN
INTRAVENOUS | Status: AC
  Filled 2022-08-23: qty 100

## 2022-08-23 MED ORDER — DEXAMETHASONE SODIUM PHOSPHATE 10 MG/ML IJ SOLN
INTRAMUSCULAR | Status: DC | PRN
  Administered 2022-08-23: 10 mg via INTRAVENOUS

## 2022-08-23 MED ORDER — ASPIRIN 81 MG PO TBEC
81.0000 mg | DELAYED_RELEASE_TABLET | Freq: Every day | ORAL | Status: DC
  Administered 2022-08-26: 81 mg via ORAL
  Filled 2022-08-23: qty 1

## 2022-08-23 MED ORDER — POTASSIUM CHLORIDE IN NACL 20-0.9 MEQ/L-% IV SOLN
INTRAVENOUS | Status: DC
  Filled 2022-08-23: qty 1000

## 2022-08-23 MED ORDER — ACETAMINOPHEN 10 MG/ML IV SOLN
INTRAVENOUS | Status: DC | PRN
  Administered 2022-08-23: 1000 mg via INTRAVENOUS

## 2022-08-23 MED ORDER — ORAL CARE MOUTH RINSE: 15.0000 mL | Freq: Once | OROMUCOSAL | Status: AC

## 2022-08-23 MED ORDER — SODIUM CHLORIDE 0.9% FLUSH
3.0000 mL | INTRAVENOUS | Status: DC | PRN
  Administered 2022-08-25 – 2022-08-26 (×2): 3 mL via INTRAVENOUS

## 2022-08-23 MED ORDER — BUPIVACAINE HCL (PF) 0.5 % IJ SOLN
INTRAMUSCULAR | Status: AC
  Filled 2022-08-23: qty 30

## 2022-08-23 MED ORDER — SODIUM CHLORIDE 0.9% FLUSH
3.0000 mL | Freq: Two times a day (BID) | INTRAVENOUS | Status: DC
  Administered 2022-08-23 – 2022-08-25 (×5): 3 mL via INTRAVENOUS

## 2022-08-23 MED ORDER — TRAZODONE HCL 100 MG PO TABS
100.0000 mg | ORAL_TABLET | Freq: Every evening | ORAL | Status: DC | PRN
  Administered 2022-08-24 – 2022-08-25 (×2): 100 mg via ORAL
  Filled 2022-08-23 (×2): qty 1

## 2022-08-23 MED ORDER — THROMBIN 5000 UNITS EX SOLR: OROMUCOSAL | Status: DC | PRN

## 2022-08-23 MED ORDER — BUSPIRONE HCL 10 MG PO TABS
15.0000 mg | ORAL_TABLET | Freq: Every day | ORAL | Status: DC
  Administered 2022-08-24 – 2022-08-26 (×3): 15 mg via ORAL
  Filled 2022-08-23 (×3): qty 2

## 2022-08-23 MED ORDER — FLEET ENEMA 7-19 GM/118ML RE ENEM: 1.0000 | ENEMA | Freq: Once | RECTAL | Status: DC | PRN

## 2022-08-23 MED ORDER — HYDROMORPHONE HCL 1 MG/ML IJ SOLN
0.5000 mg | INTRAMUSCULAR | Status: DC | PRN
  Administered 2022-08-23 – 2022-08-24 (×7): 0.5 mg via INTRAVENOUS
  Filled 2022-08-23 (×7): qty 0.5

## 2022-08-23 MED ORDER — BUDESON-GLYCOPYRROL-FORMOTEROL 160-9-4.8 MCG/ACT IN AERO: 2.0000 | INHALATION_SPRAY | Freq: Two times a day (BID) | RESPIRATORY_TRACT | Status: DC

## 2022-08-23 MED ORDER — ACETAMINOPHEN 500 MG PO TABS
1000.0000 mg | ORAL_TABLET | Freq: Once | ORAL | Status: AC
  Administered 2022-08-23: 1000 mg via ORAL
  Filled 2022-08-23: qty 2

## 2022-08-23 MED ORDER — ENOXAPARIN SODIUM 40 MG/0.4ML IJ SOSY: 40.0000 mg | PREFILLED_SYRINGE | INTRAMUSCULAR | Status: DC

## 2022-08-23 MED ORDER — EPHEDRINE 5 MG/ML INJ
INTRAVENOUS | Status: AC
  Filled 2022-08-23: qty 10

## 2022-08-23 MED ORDER — LIDOCAINE-EPINEPHRINE 1 %-1:100000 IJ SOLN
INTRAMUSCULAR | Status: DC | PRN
  Administered 2022-08-23: 7 mL
  Administered 2022-08-23: 3 mL

## 2022-08-23 MED ORDER — NICOTINE 21 MG/24HR TD PT24
21.0000 mg | MEDICATED_PATCH | Freq: Every day | TRANSDERMAL | Status: DC
  Filled 2022-08-23 (×2): qty 1

## 2022-08-23 MED ORDER — POLYETHYLENE GLYCOL 3350 17 G PO PACK
17.0000 g | PACK | Freq: Every day | ORAL | Status: DC | PRN
  Administered 2022-08-25: 17 g via ORAL
  Filled 2022-08-23: qty 1

## 2022-08-23 MED ORDER — ALBUTEROL SULFATE (2.5 MG/3ML) 0.083% IN NEBU: 2.5000 mg | INHALATION_SOLUTION | Freq: Four times a day (QID) | RESPIRATORY_TRACT | Status: DC | PRN

## 2022-08-23 MED ORDER — CEFAZOLIN SODIUM-DEXTROSE 2-4 GM/100ML-% IV SOLN
2.0000 g | INTRAVENOUS | Status: AC
  Administered 2022-08-23 (×2): 2 g via INTRAVENOUS
  Filled 2022-08-23: qty 100

## 2022-08-23 MED ORDER — PROPOFOL 10 MG/ML IV BOLUS
INTRAVENOUS | Status: AC
  Filled 2022-08-23: qty 20

## 2022-08-23 MED ORDER — ONDANSETRON HCL 4 MG/2ML IJ SOLN: 4.0000 mg | Freq: Four times a day (QID) | INTRAMUSCULAR | Status: DC | PRN

## 2022-08-23 MED ORDER — THROMBIN 5000 UNITS EX SOLR: CUTANEOUS | Status: DC | PRN

## 2022-08-23 MED ORDER — ATROPINE SULFATE 0.4 MG/ML IV SOLN
INTRAVENOUS | Status: AC
  Filled 2022-08-23: qty 1

## 2022-08-23 MED ORDER — KETAMINE HCL 10 MG/ML IJ SOLN
INTRAMUSCULAR | Status: DC | PRN
  Administered 2022-08-23: 10 mg via INTRAVENOUS
  Administered 2022-08-23: 30 mg via INTRAVENOUS
  Administered 2022-08-23: 10 mg via INTRAVENOUS

## 2022-08-23 MED ORDER — OXYCODONE HCL 5 MG PO TABS: 5.0000 mg | ORAL_TABLET | ORAL | Status: DC | PRN

## 2022-08-23 SURGICAL SUPPLY — 86 items
ADH SKN CLS APL DERMABOND .7 (GAUZE/BANDAGES/DRESSINGS) ×1
BAG COUNTER SPONGE SURGICOUNT (BAG) ×2 IMPLANT
BAG SPNG CNTER NS LX DISP (BAG) ×1
BLADE BONE MILL MEDIUM (MISCELLANEOUS) IMPLANT
BLADE CLIPPER SURG (BLADE) IMPLANT
BLADE EXTENDER LTP N26 DISP (ORTHOPEDIC DISPOSABLE SUPPLIES) IMPLANT
BUR 14 MATCH 3 (BUR) IMPLANT
BUR MATCHSTICK NEURO 3.0 LAGG (BURR) IMPLANT
BUR MR8 14 BALL 5 (BUR) IMPLANT
BUR PRECISION FLUTE 5.0 (BURR) IMPLANT
BURR 14 MATCH 3 (BUR)
BURR MR8 14 BALL 5 (BUR)
CAP PUSHER PTP (ORTHOPEDIC DISPOSABLE SUPPLIES) IMPLANT
CLIP SPRING STIM LLIF SAFEOP (CLIP) IMPLANT
CNTNR URN SCR LID CUP LEK RST (MISCELLANEOUS) IMPLANT
CONT SPEC 4OZ STRL OR WHT (MISCELLANEOUS) ×1
COVERAGE SUPPORT O-ARM STEALTH (MISCELLANEOUS) ×1 IMPLANT
DERMABOND ADVANCED .7 DNX12 (GAUZE/BANDAGES/DRESSINGS) ×2 IMPLANT
DILATOR INSULATED LLIF 8-13-18 (NEUROSURGERY SUPPLIES) IMPLANT
DISSECTOR BLUNT TIP ENDO 5MM (MISCELLANEOUS) IMPLANT
DRAIN JACKSON PRATT 10MM FLAT (MISCELLANEOUS) IMPLANT
DRAPE C-ARM 42X72 X-RAY (DRAPES) ×2 IMPLANT
DRAPE C-ARMOR (DRAPES) ×2 IMPLANT
DRAPE LAPAROTOMY 100X72X124 (DRAPES) ×2 IMPLANT
DRAPE SHEET LG 3/4 BI-LAMINATE (DRAPES) ×8 IMPLANT
DRSG OPSITE POSTOP 4X6 (GAUZE/BANDAGES/DRESSINGS) IMPLANT
DRSG OPSITE POSTOP 4X8 (GAUZE/BANDAGES/DRESSINGS) IMPLANT
DURAPREP 26ML APPLICATOR (WOUND CARE) ×2 IMPLANT
ELECT KIT SAFEOP SSEP/SURF (KITS) ×1
ELECT REM PT RETURN 9FT ADLT (ELECTROSURGICAL) ×1
ELECTRODE KT SAFEOP SSEP/SURF (KITS) IMPLANT
ELECTRODE REM PT RTRN 9FT ADLT (ELECTROSURGICAL) ×2 IMPLANT
EVACUATOR SILICONE 100CC (DRAIN) IMPLANT
FEE COVERAGE SUPPORT O-ARM (MISCELLANEOUS) ×2 IMPLANT
GAUZE 4X4 16PLY ~~LOC~~+RFID DBL (SPONGE) IMPLANT
GLOVE BIOGEL PI IND STRL 7.5 (GLOVE) ×2 IMPLANT
GLOVE ECLIPSE 7.5 STRL STRAW (GLOVE) ×2 IMPLANT
GLOVE EXAM NITRILE XL STR (GLOVE) IMPLANT
GLOVE SURG ENC MOIS LTX SZ8 (GLOVE) ×2 IMPLANT
GOWN STRL REUS W/ TWL LRG LVL3 (GOWN DISPOSABLE) IMPLANT
GOWN STRL REUS W/ TWL XL LVL3 (GOWN DISPOSABLE) ×6 IMPLANT
GOWN STRL REUS W/TWL 2XL LVL3 (GOWN DISPOSABLE) IMPLANT
GOWN STRL REUS W/TWL LRG LVL3 (GOWN DISPOSABLE)
GOWN STRL REUS W/TWL XL LVL3 (GOWN DISPOSABLE) ×3
GUIDEWIRE LLIF TT 320 (WIRE) IMPLANT
HEMOSTAT POWDER KIT SURGIFOAM (HEMOSTASIS) IMPLANT
KIT BASIN OR (CUSTOM PROCEDURE TRAY) ×2 IMPLANT
KIT INFUSE SMALL (Orthopedic Implant) IMPLANT
KIT TURNOVER KIT B (KITS) ×2 IMPLANT
KNIFE ANNULOTOMY (BLADE) IMPLANT
LIF ILLUMINATION SYSTEM STERIL (SYSTAGENIX WOUND MANAGEMENT) ×1
MARKER SPHERE PSV REFLC NDI (MISCELLANEOUS) ×10 IMPLANT
MATRIX SPINE STRIP NEOCORE 5CC (Putty) IMPLANT
MATRIX STRIP NEOCORE 12C (Putty) IMPLANT
MODULE POSITIONING LIF 2.0 (INSTRUMENTS) IMPLANT
NDL HYPO 21X1.5 SAFETY (NEEDLE) ×2 IMPLANT
NEEDLE HYPO 21X1.5 SAFETY (NEEDLE) ×1 IMPLANT
NEEDLE HYPO 22GX1.5 SAFETY (NEEDLE) ×2 IMPLANT
NS IRRIG 1000ML POUR BTL (IV SOLUTION) ×2 IMPLANT
PACK LAMINECTOMY NEURO (CUSTOM PROCEDURE TRAY) ×2 IMPLANT
PROBE BALL TIP LLIF SAFEOP (NEUROSURGERY SUPPLIES) IMPLANT
ROD LORD LIPPED TI 5.5X80 (Rod) IMPLANT
SCREW CANC PA 6.5X45 (Screw) IMPLANT
SCREW CORT PA 6.5X50 (Screw) IMPLANT
SCREW CORT PA 7.5X50 (Screw) IMPLANT
SET SCREW (Screw) ×6 IMPLANT
SET SCREW SPNE (Screw) IMPLANT
SHIM INTRADISCAL LTP W DISP (ORTHOPEDIC DISPOSABLE SUPPLIES) IMPLANT
SPACER LIF IDENTITI 8X22X60 10 (Spacer) IMPLANT
SPIKE FLUID TRANSFER (MISCELLANEOUS) ×2 IMPLANT
SPONGE SURGIFOAM ABS GEL SZ50 (HEMOSTASIS) IMPLANT
SPONGE T-LAP 4X18 ~~LOC~~+RFID (SPONGE) IMPLANT
SPONGE TONSIL TAPE 1 RFD (DISPOSABLE) IMPLANT
STAPLER VISISTAT 35W (STAPLE) ×2 IMPLANT
STRIP MATRIX NEOCORE 12CC (Putty) ×1 IMPLANT
STRIP MATRIX NEOCORE 5CC (Putty) ×1 IMPLANT
SUT MNCRL AB 4-0 PS2 18 (SUTURE) ×2 IMPLANT
SUT VIC AB 0 CT1 18XCR BRD8 (SUTURE) ×2 IMPLANT
SUT VIC AB 0 CT1 8-18 (SUTURE) ×2
SUT VIC AB 2-0 CP2 18 (SUTURE) ×2 IMPLANT
SYR 20ML LL LF (SYRINGE) ×2 IMPLANT
SYSTEM ILLUMINATION LIF STERIL (SYSTAGENIX WOUND MANAGEMENT) IMPLANT
TOWEL GREEN STERILE (TOWEL DISPOSABLE) ×2 IMPLANT
TOWEL GREEN STERILE FF (TOWEL DISPOSABLE) ×2 IMPLANT
TRAY FOLEY MTR SLVR 16FR STAT (SET/KITS/TRAYS/PACK) ×2 IMPLANT
WATER STERILE IRR 1000ML POUR (IV SOLUTION) ×2 IMPLANT

## 2022-08-23 NOTE — Anesthesia Procedure Notes (Signed)
Procedure Name: Intubation Date/Time: 08/23/2022 8:35 AM  Performed by: Lowella Dell, CRNAPre-anesthesia Checklist: Patient identified, Emergency Drugs available, Suction available and Patient being monitored Patient Re-evaluated:Patient Re-evaluated prior to induction Oxygen Delivery Method: Circle System Utilized Preoxygenation: Pre-oxygenation with 100% oxygen Induction Type: IV induction Ventilation: Mask ventilation without difficulty Laryngoscope Size: Mac and 4 Grade View: Grade I Tube type: Oral Tube size: 8.0 mm Number of attempts: 1 Airway Equipment and Method: Stylet and Oral airway Placement Confirmation: ETT inserted through vocal cords under direct vision, positive ETCO2 and breath sounds checked- equal and bilateral Secured at: 22 cm Tube secured with: Tape Dental Injury: Teeth and Oropharynx as per pre-operative assessment  Comments: SRNA performed intubation under direct supervision of CRNA and anesthesiologist.

## 2022-08-23 NOTE — Progress Notes (Signed)
A Line discontinued at this time patient tolerated procedure pressure dressing in place and pressure held to site

## 2022-08-23 NOTE — Anesthesia Postprocedure Evaluation (Signed)
Anesthesia Post Note  Patient: Keith Gibbs  Procedure(s) Performed: DIRECT LATERAL INTERBODY FUSION  LUMBAR TWO- LUMBAR THREE, LUMBAR THREE-LUMBAR FOUR , EXPLORE AND EXTEND FUSION LUMBAR TWO-LUMBAR FIVE, POSTERIOR DECOMPRESSION LUMBAR THREE-LUMBAR FOUR, LEFT LUMBAR TWO-LUMBAR THREE     Patient location during evaluation: PACU Anesthesia Type: General Level of consciousness: awake and alert Pain management: pain level controlled Vital Signs Assessment: post-procedure vital signs reviewed and stable Respiratory status: spontaneous breathing, nonlabored ventilation, respiratory function stable and patient connected to nasal cannula oxygen Cardiovascular status: blood pressure returned to baseline and stable Postop Assessment: no apparent nausea or vomiting Anesthetic complications: no  No notable events documented.  Last Vitals:  Vitals:   08/23/22 1615 08/23/22 1630  BP: (!) 145/75 (!) 153/87  Pulse: 68 67  Resp: 18 18  Temp:    SpO2: 93% 94%    Last Pain:  Vitals:   08/23/22 1630  TempSrc:   PainSc: Asleep                 Stellah Donovan,W. EDMOND

## 2022-08-23 NOTE — Progress Notes (Signed)
Orthopedic Tech Progress Note Patient Details:  Keith Gibbs [birth year] [id number]  Ortho Devices Type of Ortho Device: Lumbar corsett Ortho Device/Splint Location: BACK Ortho Device/Splint Interventions: Ordered   Post Interventions Patient Tolerated: Well Instructions Provided: Care of device  Janit Pagan 08/23/2022, 6:12 PM

## 2022-08-23 NOTE — Transfer of Care (Signed)
Immediate Anesthesia Transfer of Care Note  Patient: Keith Gibbs  Procedure(s) Performed: DIRECT LATERAL INTERBODY FUSION  LUMBAR TWO- LUMBAR THREE, LUMBAR THREE-LUMBAR FOUR , EXPLORE AND EXTEND FUSION LUMBAR TWO-LUMBAR FIVE, POSTERIOR DECOMPRESSION LUMBAR THREE-LUMBAR FOUR, LEFT LUMBAR TWO-LUMBAR THREE  Patient Location: PACU  Anesthesia Type:General  Level of Consciousness: drowsy and responds to stimulation  Airway & Oxygen Therapy: Patient Spontanous Breathing and Patient connected to face mask oxygen  Post-op Assessment: Report given to RN and Post -op Vital signs reviewed and stable  Post vital signs: Reviewed and stable  Last Vitals:  Vitals Value Taken Time  BP 128/70 08/23/22 1545  Temp    Pulse 67 08/23/22 1546  Resp 16 08/23/22 1546  SpO2 97 % 08/23/22 1546  Vitals shown include unvalidated device data.  Last Pain:  Vitals:   08/23/22 0653  TempSrc:   PainSc: 10-Worst pain ever         Complications: No notable events documented.

## 2022-08-23 NOTE — H&P (Signed)
CC: back and leg pain  HPI:     Patient is a 62 y.o. male w/ hx of L4-S1 PSIF, chronic pain presents with back and leg pain progressive in nature, with significant ambulatory difficulty. Nonsurgical therapies failed to help his pain.    Patient Active Problem List   Diagnosis Date Noted   Postlaminectomy syndrome, not elsewhere classified 08/12/2022   MDD (major depressive disorder) 08/10/2022   Substance induced mood disorder ([place]) 08/06/2022   Suicide attempt by drug overdose ([place]) 08/06/2022   Toxic encephalopathy 08/03/2022   Suicidal ideation 08/03/2022   Encephalopathy 08/03/2022   Polysubstance abuse ([place]) 04/28/2022   Postoperative abdominal pain 02/28/2022   Acute cholecystitis 02/17/2022   Obstruction of nasal valve 01/07/2022   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   Pulmonary nodule 12/17/2021   CAD S/P percutaneous coronary angioplasty 12/17/2021   Epigastric abdominal pain    Nausea and vomiting    Tobacco dependence due to cigarettes 08/31/2021   Iron deficiency anemia secondary to inadequate dietary iron intake 08/31/2021   Acute lead-induced gout involving toe of left foot 08/31/2021   Encounter for lipid screening for cardiovascular disease 08/31/2021   Hypotension due to drugs 06/21/2021   Opioid withdrawal ([place]) 06/21/2021   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[place]) 06/21/2021   RLS (restless legs syndrome) 05/07/2021   Acquired deviated nasal septum 05/03/2021   Encounter for surveillance of abnormal nevi 05/03/2021   Flu vaccine need 05/03/2021   Substance abuse ([place]) 05/03/2021   Drug abuse and dependence ([place]) 05/03/2021   Acute midline low back pain without sciatica 05/03/2021   Need for shingles vaccine 05/03/2021   Right groin pain 01/29/2021   S/P TKR (total knee replacement) using cement, right 01/19/2021   Cigarette smoker 07/21/2020   Foot pain, bilateral 12/25/2019   Potential exposure to STD 12/12/2019   History of lumbar surgery 12/12/2019   Fatigue 12/12/2019    Penile lesion 12/12/2019   Right testicular pain 12/12/2019   Body mass index 28.0-28.9, adult 12/12/2019   History of COPD 12/12/2019   Tobacco use disorder, continuous 12/12/2019   Hepatoma ([place]) 11/05/2019   Anxiety 12/10/2018   Unstable angina ([place]) 11/30/2018   Essential hypertension 09/18/2018   Heart valve disorder 09/18/2018   Hypercholesteremia 09/18/2018   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   Chronic anticoagulation (Plavix) 04/19/2018   Pain medication agreement broken ([place]) 04/19/2018   Cocaine use 04/18/2018   Cocaine abuse ([place]) (See 04/12/18 UDS) 04/18/2018   Marijuana abuse (See 04/12/18 UDS) 04/18/2018   Long term current use of opiate analgesic 04/12/2018   Cirrhosis of liver ([place]) 04/03/2018   BPH (benign prostatic hyperplasia) 04/03/2018   Hematuria, microscopic 04/03/2018   Osteoarthritis of the knee (Left) 11/02/2017   Lumbar facet syndrome (Bilateral) 11/01/2017   Spondylosis without myelopathy or radiculopathy, lumbosacral region 11/01/2017   Osteoarthritis of shoulder (Right) 11/01/2017   Osteoarthritis of acromioclavicular joint (Right) 11/01/2017   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) 11/01/2017   Osteoarthritis 11/01/2017   Cervicalgia 11/01/2017   Cervical facet syndrome 11/01/2017   DDD (degenerative disc disease), cervical 11/01/2017   DDD (degenerative disc disease), lumbar 11/01/2017   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) 09/18/2017   Other chronic pain 09/18/2017   Chronic musculoskeletal pain 09/18/2017   Chronic pain of right knee 09/11/2017   Chronic low back pain (Secondary Area of Pain) (Bilateral) 09/11/2017   Chronic hip pain [facility] Area of Pain) (Bilateral) (R>L) 09/11/2017   Chronic shoulder pain (Fourth Area of Pain) (Right) 09/11/2017  Spondylosis without myelopathy or radiculopathy, cervical region 09/11/2017   Chronic pain syndrome 09/11/2017   Opiate use 09/11/2017   Screen for STD (sexually  transmitted disease) 09/11/2017   Disorder of skeletal system 09/11/2017   Problems influencing health status 09/11/2017   Overdose of medication, intentional self-harm, sequela ([place]) 12/28/2016   Severe recurrent major depression without psychotic features ([place]) 12/27/2016   Coronary artery disease of native artery of native heart with stable angina pectoris ([place]) 12/27/2016   Alcohol use disorder, moderate, dependence ([place]) 12/27/2016   Cannabis use disorder, moderate, dependence ([place]) 12/27/2016   Angina at rest 06/11/2016   Chest pain 06/10/2016   Bipolar 1 disorder ([place]) 05/27/2013   Abnormal ejaculation 11/30/2012   Chest pain, unspecified 11/30/2012   Degenerative arthritis of hip 11/16/2012   Schizoaffective disorder ([place]) 06/27/2012   COPD (chronic obstructive pulmonary disease) ([place]) 06/27/2012   Chronic hepatitis C ([place]) 06/27/2012   GERD (gastroesophageal reflux disease) 06/27/2012   Complete rupture of rotator cuff 02/10/2012   Cocaine abuse in remission ([place]) 12/05/2011   Unspecified osteoarthritis, unspecified site 07/27/2011   Past Medical History:  Diagnosis Date   Anginal pain ([place])    Anxiety    Arthritis    Bipolar 1 disorder ([place])    Bursitis    CAD (coronary artery disease)    Chronic pain    COPD (chronic obstructive pulmonary disease) ([place])    Current use of long term anticoagulation    DAPT (ASA + clopidogrel)   Depression    Diverticulitis    Dyspnea    GERD (gastroesophageal reflux disease)    Grade I diastolic dysfunction    Hepatitis C 2012   No longer has Hep C   HLD (hyperlipidemia)    Hypertension    MI (myocardial infarction) ([place])    Polysubstance abuse ([place])    cocaine, marijuana, ETOH   PUD (peptic ulcer disease)    S/P angioplasty with stent 06/10/2016   a.) 90% stenosis of pLAD to mLAD - 2.5 x 18 mm Xience Alpine (DES x 1) placed to pLAD   S/P PTCA (percutaneous transluminal coronary angioplasty) 12/04/2019   a.) 60% in stent  restenosis of DES to pLAD; LVEF 65%.   Schizophrenia ([place])    Stroke [facility])    Valvular insufficiency    a.) Mild MR, TR, PR; mild to moderate AR on 03/05/2018 TTE    Past Surgical History:  Procedure Laterality Date   ABDOMINAL SURGERY     removed small piece of intestines due to [facility] Diverticulosis   APPENDECTOMY     BACK SURGERY     CARDIAC CATHETERIZATION Left 06/10/2016   Procedure: Left Heart Cath and Coronary Angiography;  Surgeon: Dionisio David, MD;  Location: [place] CV LAB;  Service: Cardiovascular;  Laterality: Left;   CARDIAC CATHETERIZATION N/A 06/10/2016   Procedure: Coronary Stent Intervention;  Surgeon: Yolonda Kida, MD;  Location: [place] CV LAB;  Service: Cardiovascular;  Laterality: N/A;   CHOLECYSTECTOMY N/A 02/17/2022   Procedure: LAPAROSCOPIC CHOLECYSTECTOMY;  Surgeon: Kieth Brightly Arta Bruce, MD;  Location: [place];  Service: General;  Laterality: N/A;   COLON SURGERY     COLONOSCOPY     COLONOSCOPY WITH PROPOFOL N/A 01/05/2017   Procedure: COLONOSCOPY WITH PROPOFOL;  Surgeon: Jonathon Bellows, MD;  Location[facility] ENDOSCOPY;  Service: Endoscopy;  Laterality: N/A;   COLONOSCOPY WITH PROPOFOL N/A 02/13/2020   Procedure: COLONOSCOPY WITH PROPOFOL;  Surgeon: Virgel Manifold, MD;  Location: [facility] ENDOSCOPY;  Service:  Endoscopy;  Laterality: N/A;   CORONARY ANGIOPLASTY WITH STENT PLACEMENT     ESOPHAGOGASTRODUODENOSCOPY (EGD) WITH PROPOFOL N/A 01/05/2017   Procedure: ESOPHAGOGASTRODUODENOSCOPY (EGD) WITH PROPOFOL;  Surgeon: Jonathon Bellows, MD;  Location: [facility] ENDOSCOPY;  Service: Endoscopy;  Laterality: N/A;   ESOPHAGOGASTRODUODENOSCOPY (EGD) WITH PROPOFOL N/A 02/13/2020   Procedure: ESOPHAGOGASTRODUODENOSCOPY (EGD) WITH PROPOFOL;  Surgeon: Virgel Manifold, MD;  Location: [facility] ENDOSCOPY;  Service: Endoscopy;  Laterality: N/A;   INTRAVASCULAR PRESSURE WIRE/FFR STUDY N/A 12/04/2019   Procedure: INTRAVASCULAR PRESSURE WIRE/FFR STUDY;  Surgeon: Sherren Mocha, MD;  Location: [place] CV LAB;  Service: Cardiovascular;  Laterality: N/A;   KNEE ARTHROSCOPY WITH MEDIAL MENISECTOMY Right 09/05/2017   Procedure: KNEE ARTHROSCOPY WITH MEDIAL AND LATERAL  MENISECTOMY PARTIAL SYNOVECTOMY;  Surgeon: Hessie Knows, MD;  Location: [facility] ORS;  Service: Orthopedics;  Laterality: Right;   LEFT HEART CATH AND CORONARY ANGIOGRAPHY N/A 12/04/2019   Procedure: LEFT HEART CATH AND CORONARY ANGIOGRAPHY;  Surgeon: Sherren Mocha, MD;  Location: [place] CV LAB;  Service: Cardiovascular;  Laterality: N/A;   SHOULDER SURGERY Right 04/09/2012   SPINE SURGERY     TOTAL KNEE ARTHROPLASTY Right 01/19/2021   Procedure: TOTAL KNEE ARTHROPLASTY - Rachelle Hora to Assist;  Surgeon: Hessie Knows, MD;  Location: [facility] ORS;  Service: Orthopedics;  Laterality: Right;    Medications Prior to Admission  Medication Sig Dispense Refill Last Dose   albuterol (VENTOLIN HFA) 108 (90 Base) MCG/ACT inhaler Inhale 2 puffs into the lungs every 6 (six) hours as needed for wheezing or shortness of breath. 18 g 6 08/22/2022   aspirin EC 81 MG tablet Take 1 tablet (81 mg total) by mouth daily. Swallow whole. 30 tablet 12 Past Week   atorvastatin (LIPITOR) 80 MG tablet Take 1 tablet (80 mg total) by mouth at bedtime. 30 tablet 0 08/22/2022   Budeson-Glycopyrrol-Formoterol (BREZTRI AEROSPHERE) 160-9-4.8 MCG/ACT AERO Inhale 2 puffs into the lungs in the morning and at bedtime.   08/22/2022   busPIRone (BUSPAR) 15 MG tablet Take 1 tablet (15 mg total) by mouth daily. 60 tablet 3 08/23/2022 at 0430   cyclobenzaprine (FLEXERIL) 10 MG tablet Take 1 tablet (10 mg total) by mouth 2 (two) times daily as needed for muscle spasms. 20 tablet 0 08/22/2022   divalproex (DEPAKOTE) 500 MG DR tablet Take 1 tablet (500 mg total) by mouth 2 (two) times daily. 60 tablet 3 08/23/2022 at 0430   DULoxetine (CYMBALTA) 30 MG capsule Take 3 capsules (90 mg total) by mouth daily. 90 capsule 3 08/23/2022 at 0430    fluticasone (FLONASE) 50 MCG/ACT nasal spray 1 puff Inhalation Once a day   08/22/2022   gabapentin (NEURONTIN) 300 MG capsule Take 1 capsule (300 mg total) by mouth 3 (three) times daily. 90 capsule 3 08/23/2022 at 0430   hydrOXYzine (ATARAX) 25 MG tablet Take 1 tablet (25 mg total) by mouth 3 (three) times daily as needed for anxiety. 90 tablet 3 08/22/2022   loratadine (CLARITIN) 10 MG tablet Take 1 tablet (10 mg total) by mouth daily. 30 tablet 0 08/23/2022 at 0430   nicotine (NICODERM CQ - DOSED IN MG/24 HOURS) 21 mg/24hr patch Place 1 patch (21 mg total) onto the skin daily. 28 patch 0 Past Week   rOPINIRole (REQUIP) 1 MG tablet Take 1 tablet (1 mg total) by mouth at bedtime. 30 tablet 0 08/22/2022   traZODone (DESYREL) 100 MG tablet Take 1 tablet (100 mg total) by mouth at bedtime as needed for sleep. 30 tablet  3 08/22/2022   benzonatate (TESSALON) 100 MG capsule Take 1 capsule (100 mg total) by mouth 3 (three) times daily as needed. 30 capsule 0 Unknown   diclofenac Sodium (VOLTAREN) 1 % GEL Apply 2 g topically 4 (four) times daily. 2 g 0 Unknown   Allergies  Allergen Reactions   Latuda [Lurasidone Hcl] Other (See Comments)    Tremors     Saphris [Asenapine] Other (See Comments)    Increased tremors     Social History   Tobacco Use   Smoking status: Some Days    Packs/day: 0.50    Years: 32.00    Total pack years: 16.00    Types: Cigarettes    Last attempt to quit: 10/25/2021    Years since quitting: 0.8   Smokeless tobacco: Never   Tobacco comments:    Continues to smoke approximately 5 cigarettes per day.  Has been 2 days since las cigarette.  Has not bought any more.  Using patches and gum.  5/23 hfb  Substance Use Topics   Alcohol use: Yes    Alcohol/week: 6.0 standard drinks of alcohol    Types: 6 Cans of beer per week    Comment: occassionally     Family History  Problem Relation Age of Onset   Osteoarthritis Mother    Heart disease Mother    Hypertension Mother     Depression Mother    Heart disease Father    Early death Father    Hypertension Father    Heart attack Father    Hypertension Sister    Prostate cancer Neg Hx    Bladder Cancer Neg Hx    Kidney cancer Neg Hx      Review of Systems Pertinent items are noted in HPI.  Objective:   Patient Vitals for the past 8 hrs:  BP Temp Temp src Pulse Resp SpO2 Height Weight  08/23/22 0549 94/67 97.7 F (36.5 C) Oral (!) 44 17 95 % '6\' 3"'$  (1.905 m) 94.3 kg   No intake/output data recorded. No intake/output data recorded.      General : Alert, cooperative, no distress, appears stated age   Head:  Normocephalic/atraumatic    Eyes: PERRL, conjunctiva/corneas clear, EOM's intact. Fundi could not be visualized Neck: Supple Chest:  Respirations unlabored Chest wall: no tenderness or deformity Heart: Regular rate and rhythm Abdomen: Soft, nontender and nondistended Extremities: warm and well-perfused Skin: normal turgor, color and texture Neurologic:  Alert, oriented x 3.  Eyes open spontaneously. PERRL, EOMI, VFC, no facial droop. V1-3 intact.  No dysarthria, tongue protrusion symmetric.  CNII-XII intact. Normal strength, sensation and reflexes throughout.  No pronator drift, full strength in legs. + SLR L Well healed back incision       Data ReviewCBC:  Lab Results  Component Value Date   WBC 7.0 08/05/2022   RBC 3.91 (L) 08/05/2022   BMP:  Lab Results  Component Value Date   GLUCOSE 114 (H) 08/05/2022   CO2 24 08/05/2022   CO2 23 05/11/2017   BUN 15 08/05/2022   BUN 18 10/07/2021   CREATININE 0.99 08/05/2022   CALCIUM 8.3 (L) 08/05/2022   Radiology review:  Well healed L4-S1 fusion.  Severe stenosis/adjacent segment disease at L3-4, severe L L2-3 subarticular stenosis.  Mild spondylolisthesis  Assessment:   Active Problems:   * No active hospital problems. * Hx of L4-S1 fusion with severe lumbar stenosis and radiculopathy  Plan:   L2-4 DLIF, PSIF today - informed  consent  obtained

## 2022-08-23 NOTE — Op Note (Signed)
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  L2-3  direct lateral lumbar interbody fusion via left retroperitoneal prone transpsoas approach 2.  L3-4  direct lateral lumbar interbody fusion via left retroperitoneal prone transpsoas approach 2.  L2-3, L3-4 posterolateral arthrodesis 3. Placement of interbody cages L2-3, L3-4 4. Exploration of previous fusion, removal of instrumentation L4-L5-S1 5. Segmental instrumentation with cortical pedicle screw and rod construct at L2-3-4 6. L3-4 posterior lumbar decompression, bilateral facetectomy and foraminotomy 7. L2-3 left laminotomy, facetecomy, and resection of degenerative cyst 5. Harvest of local autograft 6. Use of morselized allograft   SURGEON: Dr. Duffy Rhody, MD   ASSISTANT:  Dr. Consuella Lose, MD. please note there are no qualified trainees available to assist with the procedure.  Assistance was required for decompression and safe instrumentation.   ANESTHESIA: General Endotracheal   EBL: 300 L   IMPLANTS:  Alpha-Tec L2-3: 8 x 22 x 60 mm, 10 degree lordosis Identi-Ti cage L3-4: 8 x 22 x 60 mm, 10 degree lordosis Identi-Ti cage 6.5 x 50 mm screws at L3, 6.5 x 45 m screws at L2, 7.5 x 50 mm screws at L4 80 mm rods Small BMP, neocore  SPECIMENS: Previous pedicle screw/rods L4-L5-S1   DRAINS: 10 flat JP   COMPLICATIONS: None   CONDITION: Stable to PACU   HISTORY: This is a 62 year old man with a history of L4-S1 PLIF who developed severe and progressive left greater than right radiculopathy, affecting his ability to walk.  Medical and physical therapy failed to control his symptoms which are severely affecting his quality of life.  Imaging showed adjacent segment disease at L3-4 with retrolisthesis and severe stenosis.  At L2-3, there was moderate to  severe left subarticular stenosis related to a synovial cyst.   Treatment options were discussed and the patient elected to proceed with lumbar interbody fusion at L3-4, L4-5. risks, benefits, alternatives, and expected convalescence were discussed with the patient.  Risks discussed included but were not limited to bleeding, pain, infection, scar, pseudoarthrosis, CSF leak, neurologic deficit, damage to nearby organs, paralysis, and death.  The patient wished to proceed with surgery and informed consent was obtained.   PROCEDURE IN DETAIL:  The patient was brought to the operative room.  General anesthesia was induced and patient was intubated by the anesthesia service.  After appropriate lines and monitors were placed, patient was positioned in the prone position with the Alpha-Tec positioner.  All pressure points padded and the eyes were protected.  The patient and the bed was positioned for a true lateral and AP C-arm x-rays at each level.  The flank  and lower back was preprepped with alcohol and prepped and draped in sterile fashion.  A timeout was performed.  1% at lidocaine with epinephrine was injected in the planned incision.  Using the C-arm x-ray, a oblique incision was planned over the flank to span the 2 disc spaces.  Incision was made with a 10 blade and the fascia was opened.  The external oblique, internal oblique, and transversalis muscle and transversalis fascia were opened bluntly with spreading instruments.  The retroperitoneal space was dissected and the peritoneal contents were reflected anteriorly.  The quadratus lumborum, transverse process and finally the psoas muscle was dissected out.  A initial dilator was placed on the lateral surface of the psoas muscle over the L3-4 disc space as  confirmed by x-ray.  The dilator was then passed through the psoas muscle and docked onto the disc.  Stimulation showed there was no proximity of any lumbar plexus nerves.  The dilator was secured in place  with a K wire and subsequent dilators were passed and stimulated and again yielded no neural activity nearby.  Retractor was then passed onto the disc space and secured in place with a bed attachment.  The dilators were removed and the field on the posterior blade were directly stimulated.  There was no visual evidence or electromyographic evidence of nerves in the field.  The retractor was then opened cranially, caudally, and anteriorly and again the field was stimulated with no evidence of nerves in the field.  Annulotomy was performed with a retractable blade and discectomy was performed with pituitary rongeurs and box cutters.  The contralateral annulus was released with Cobb and the disc space was prepared with curettes and rasps.  Trial implants were placed and with the aid of fluoroscopy with AP and lateral projections, and appropriate sized graft was placed.  Meticulous hemostasis was obtained and the retractor was then removed.    Attention was then turned to the L2-3 disc space where the lateral aspect of the psoas was palpated and initial dilator was placed over it with positioning confirmed by x-ray.  The dilator was then placed over the lateral osteophyte at the disc space and stimulated which revealed the lumbar plexus nerves were posterior to the dilator but remote.  In similar fashion, subsequent dilators were placed and stimulated and finally the retractor was placed and secured to the bed attachment.  The field was stimulated and no nerves were able to be identified in the field.  Posterior shim was placed to lock the retractor in place and the retractor blades were opened cranially, caudally, and anteriorly.  The collapsed disc space was entered with a osteotome using x-ray guidance.  Subsequently, Cobb was used to release the contralateral annulus.  This allowed good opening of the disc space.  The disc was fairly degenerated, with minimal disc remnants, but complete discectomy was performed  with curettes and pituitary rongeurs.  The endplates were prepared with rasps.  Trials and a final size implant was then placed under x-ray guidance. Meticulous hemostasis was obtained.  The retractor was then removed.    There were no changes in the femoral nerve SSEPs throughout the case.  Final x-rays showed good reduction of spondylolisthesis and lateral listhesis, restoration of disc space height and foraminal height and improved lordosis.  The retroperitoneal space was then irrigated and examined with no evidence of any bleeding.  The fascia was closed with 0 Vicryl stitches.  The dermal layer was closed with 2-0 Vicryl stitches.  Skin was closed with 4-0 Monocryl subcuticular manner followed by Dermabond.  1% lidocaine with epinephrine was injected into the midline skin and a timeout was performed.  Preoperative antibiotics were administered.  Previous incision was opened with a 10 blade and extended upwards. .  Subcutaneous tissue and the fascia was incised with monopolar electrocautery.  The paraspinous muscles and scar were dissected off the lamina in subperiosteal fashion.  Additional dissection continued out laterally, exposing the transverse processes of L2, L3, and L4 bilaterally which were then decorticated.  C-arm x-ray confirmed appropriate levels.  Scar was dissected further out to the previous instrumentation.  The previous fusion was explored and appeared to be well-healed.  The screw caps were removed, rods removed, and the screws were  removed.      The interspinous ligament at L3-4  was then removed and laminectomy of the bottom two thirds of the lamina as well as the superior third of the inferior  lamina was performed with high-speed drill. There was severe facet hypertrophy with facets extending to the midline.   Facetectomies were then performed as well bilaterally, with bone harvested for autograft.  Bilateral foraminotomies were performed.  Good decompression was confirmed with easy  passage of nerve hook.  Left L2-3 laminotomy and facetectomy was then performed.  Medium sized synovial cyst was encountered, and was very adherent to the dura. Careful blunt and sharp dissection liberated the dura from the degenerative cyst which was resected.  The thecal sac and nerve root were well decompressed.    Using C arm x-ray, pilot holes were drilled for pedicle screws bilaterally at L2, and L3  The pedicles were cannulated with a gear shift followed by tap under C-arm guidance.  Ball ended feeler confirmed good bony channels circumferentially and at the depth.  Using C-arm x-ray, screws were then placed in the channels as well as in the previous L4 pedicle holes with good purchase.  Meticulous hemostasis was obtained.  Good decompression was confirmed with easy passage of ball ended nerve hook.  Rods were placed bilaterally and secured with screw caps and final tightened.  Final AP and lateral C-arm x-rays showed good placement of the implants.  Remaining autograft and allograft was placed in the lateral gutters between the transverse processes bilaterally.  A 10 flat JP drain was placed in the subfascial space and tunneled out the skin and secured with a stitch.   Wound was irrigated thoroughly.  Exparel mixed with Marcaine was injected into the paraspinous muscles and subcutaneous tissues bilaterally.  Vancomycin powder was sprinkled into the wound.  The fascia was closed with 0 Vicryl stitches.  The dermal layer was closed with 2-0 Vicryl stitches in buried interrupted fashion.  The skin incisions were closed with 4-0 Monocryl subcuticular manner followed by Dermabond.  Sterile dressings were placed.  Patient was then flipped supine and extubated by the anesthesia service following commands and all 4 extremities.  All counts were correct at the end of surgery.  No complications were noted.

## 2022-08-23 NOTE — Anesthesia Procedure Notes (Signed)
Arterial Line Insertion Start/End1/30/2024 8:35 AM, 08/23/2022 8:40 AM Performed by: Lowella Dell, CRNA, CRNA  Patient location: Pre-op. Preanesthetic checklist: patient identified, IV checked, site marked, risks and benefits discussed, surgical consent, monitors and equipment checked, pre-op evaluation, timeout performed and anesthesia consent Patient sedated Right, radial was placed Catheter size: 20 G Hand hygiene performed  and maximum sterile barriers used   Attempts: 1 Procedure performed without using ultrasound guided technique. Following insertion, dressing applied. Post procedure assessment: normal and unchanged

## 2022-08-24 ENCOUNTER — Encounter (HOSPITAL_COMMUNITY): Payer: Self-pay | Admitting: Neurosurgery

## 2022-08-24 LAB — CBC
HCT: 32.4 % — ABNORMAL LOW (ref 39.0–52.0)
Hemoglobin: 10.9 g/dL — ABNORMAL LOW (ref 13.0–17.0)
MCH: 32.3 pg (ref 26.0–34.0)
MCHC: 33.6 g/dL (ref 30.0–36.0)
MCV: 96.1 fL (ref 80.0–100.0)
Platelets: 257 10*3/uL (ref 150–400)
RBC: 3.37 MIL/uL — ABNORMAL LOW (ref 4.22–5.81)
RDW: 13.8 % (ref 11.5–15.5)
WBC: 13.5 10*3/uL — ABNORMAL HIGH (ref 4.0–10.5)
nRBC: 0 % (ref 0.0–0.2)

## 2022-08-24 LAB — BASIC METABOLIC PANEL
Anion gap: 6 (ref 5–15)
BUN: 13 mg/dL (ref 8–23)
CO2: 26 mmol/L (ref 22–32)
Calcium: 8.1 mg/dL — ABNORMAL LOW (ref 8.9–10.3)
Chloride: 102 mmol/L (ref 98–111)
Creatinine, Ser: 0.96 mg/dL (ref 0.61–1.24)
GFR, Estimated: >60 mL/min (ref >60)
Glucose, Bld: 126 mg/dL — ABNORMAL HIGH (ref 70–99)
Potassium: 3.9 mmol/L (ref 3.5–5.1)
Sodium: 134 mmol/L — ABNORMAL LOW (ref 135–145)

## 2022-08-24 MED ORDER — ENOXAPARIN SODIUM 40 MG/0.4ML IJ SOSY
40.0000 mg | PREFILLED_SYRINGE | INTRAMUSCULAR | Status: DC
  Administered 2022-08-25: 40 mg via SUBCUTANEOUS
  Filled 2022-08-24: qty 0.4

## 2022-08-24 MED ORDER — HYDROMORPHONE HCL 1 MG/ML IJ SOLN
0.7500 mg | INTRAMUSCULAR | Status: DC | PRN
  Administered 2022-08-24 – 2022-08-26 (×13): 0.75 mg via INTRAVENOUS
  Filled 2022-08-24 (×13): qty 1

## 2022-08-24 MED FILL — Thrombin For Soln 5000 Unit: CUTANEOUS | Qty: 5000 | Status: AC

## 2022-08-24 NOTE — Evaluation (Signed)
Physical Therapy Evaluation and Discharge Patient Details Name: Keith Gibbs MRN: [id number] DOB: [birth year] Today's Date: 08/24/2022  History of Present Illness  Pt is a 62 yo male admitted for redo PSIF from L2-L4 due to difficulty ambulating and pain down L leg.  PHM: PSIF 10 years ago, bipolar d/o, substance abuse, R TKR, COPD, GERD.  Clinical Impression  Patient evaluated by Physical Therapy with no further acute PT needs identified. Pt denies radicular pain; does reports continued numbness in bilateral thighs. Pt ambulating 200 ft with a walker and negotiated 6 steps with railings to simulate home set up without physical assist. Education reviewed regarding spinal precautions, appropriate DME, activity recommendations, car transfer technique, cryotherapy. All education has been completed and the patient has no further questions. No follow-up Physical Therapy or equipment needs. PT is signing off. Thank you for this referral.      Recommendations for follow up therapy are one component of a multi-disciplinary discharge planning process, led by the attending physician.  Recommendations may be updated based on patient status, additional functional criteria and insurance authorization.  Follow Up Recommendations No PT follow up      Assistance Recommended at Discharge Intermittent Supervision/Assistance  Patient can return home with the following  Assistance with cooking/housework;Assist for transportation    Equipment Recommendations None recommended by PT (pt has needed DME)  Recommendations for Other Services       Functional Status Assessment Patient has had a recent decline in their functional status and demonstrates the ability to make significant improvements in function in a reasonable and predictable amount of time.     Precautions / Restrictions Precautions Precautions: Fall Required Braces or Orthoses: Spinal Brace Spinal Brace: Thoracolumbosacral orthotic;Applied in  supine position Restrictions Weight Bearing Restrictions: No      Mobility  Bed Mobility               General bed mobility comments: Sitting EOB upon arrival    Transfers Overall transfer level: Needs assistance Equipment used: Rolling walker (2 wheels) Transfers: Sit to/from Stand Sit to Stand: Supervision           General transfer comment: good power up    Ambulation/Gait Ambulation/Gait assistance: Supervision Gait Distance (Feet): 200 Feet Assistive device: Rolling walker (2 wheels) Gait Pattern/deviations: Step-through pattern, Decreased stride length       General Gait Details: Slow and steady pace, good posture and proximity to RW  Stairs Stairs: Yes Stairs assistance: Modified independent (Device/Increase time) Stair Management: Two rails Number of Stairs: 6 General stair comments: Cues for sequencing/technique  Wheelchair Mobility    Modified Rankin (Stroke Patients Only)       Balance Overall balance assessment: Mild deficits observed, not formally tested                                           Pertinent Vitals/Pain Pain Assessment Pain Assessment: Faces Faces Pain Scale: Hurts even more Pain Location: back Pain Descriptors / Indicators: Aching, Discomfort, Grimacing, Guarding, Sore Pain Intervention(s): Limited activity within patient's tolerance, Monitored during session, Premedicated before session    Home Living Family/patient expects to be discharged to:: Private residence Living Arrangements: Parent Available Help at Discharge: Family;Available 24 hours/day Type of Home: House Home Access: Stairs to enter Entrance Stairs-Rails: Psychiatric nurse of Steps: 6   Home Layout: One level Home Equipment: Conservation officer, nature (2  wheels);Cane - single point;Shower seat;BSC/3in1 Additional Comments: Pt uses cane at home    Prior Function Prior Level of Function : Independent/Modified Independent              Mobility Comments: walks with cane ADLs Comments: mod I but difficulty with socks and shoes     Hand Dominance   Dominant Hand: Right    Extremity/Trunk Assessment   Upper Extremity Assessment Upper Extremity Assessment: Defer to OT evaluation RUE Deficits / Details: signficant tremors that have been present for years RUE Sensation: WNL LUE Deficits / Details: signficant tremors LUE Sensation: WNL    Lower Extremity Assessment Lower Extremity Assessment: RLE deficits/detail;LLE deficits/detail RLE Deficits / Details: Strength 5/5 LLE Deficits / Details: Strength 5/5    Cervical / Trunk Assessment Cervical / Trunk Assessment: Back Surgery  Communication   Communication: No difficulties  Cognition Arousal/Alertness: Awake/alert Behavior During Therapy: WFL for tasks assessed/performed Overall Cognitive Status: Within Functional Limits for tasks assessed                                          General Comments General comments (skin integrity, edema, etc.): Pt with drain in back. Pt cont with pain.    Exercises     Assessment/Plan    PT Assessment Patient does not need any further PT services  PT Problem List Decreased mobility;Pain       PT Treatment Interventions      PT Goals (Current goals can be found in the Care Plan section)  Acute Rehab PT Goals Patient Stated Goal: go home PT Goal Formulation: All assessment and education complete, DC therapy    Frequency       Co-evaluation               AM-PAC PT "6 Clicks" Mobility  Outcome Measure Help needed turning from your back to your side while in a flat bed without using bedrails?: None Help needed moving from lying on your back to sitting on the side of a flat bed without using bedrails?: None Help needed moving to and from a bed to a chair (including a wheelchair)?: A Little Help needed standing up from a chair using your arms (e.g., wheelchair or bedside  chair)?: A Little Help needed to walk in hospital room?: A Little Help needed climbing 3-5 steps with a railing? : None 6 Click Score: 21    End of Session Equipment Utilized During Treatment: Back brace Activity Tolerance: Patient tolerated treatment well Patient left: in chair;with call bell/phone within reach Nurse Communication: Mobility status PT Visit Diagnosis: Pain;Difficulty in walking, not elsewhere classified (R26.2) Pain - part of body:  (back)    Time: 0932-6712 PT Time Calculation (min) (ACUTE ONLY): 18 min   Charges:   PT Evaluation $PT Eval Low Complexity: [place], PT, DPT Acute Rehabilitation Services Office [phone]   Deno Etienne 08/24/2022, 9:51 AM

## 2022-08-24 NOTE — Evaluation (Signed)
Occupational Therapy Evaluation Patient Details Name: Keith Gibbs MRN: [id number] DOB: 06/24/1961 Today's Date: 08/24/2022   History of Present Illness Pt is a 62 yo male admitted for redo PSIF from L2-L4 due to difficulty ambulating and pain down L leg.  PHM: PSIF 10 years ago, bipolar d/o, substance abuse, R TKR, COPD, GERD.   Clinical Impression   Pt admitted with the above diagnosis and has the deficits listed below. Pt would benefit from cont OT to increase independence with basic LE dressing and bathing and transfers so he can d/c home with his mother who is there all the time.  Will provide adaptive equipment for bathing and dressing to pt out of indigent fund which should allow him to be independent with LE adls.  Will continue to see acutely to review use of equipment, back precautions and donning brace.      Recommendations for follow up therapy are one component of a multi-disciplinary discharge planning process, led by the attending physician.  Recommendations may be updated based on patient status, additional functional criteria and insurance authorization.   Follow Up Recommendations  No OT follow up     Assistance Recommended at Discharge Set up Supervision/Assistance  Patient can return home with the following A little help with bathing/dressing/bathroom;Assistance with cooking/housework;Assist for transportation;Help with stairs or ramp for entrance    Functional Status Assessment  Patient has had a recent decline in their functional status and demonstrates the ability to make significant improvements in function in a reasonable and predictable amount of time.  Equipment Recommendations  Other (comment) (reacher and sock aid)    Recommendations for Other Services       Precautions / Restrictions Precautions Precautions: Fall Required Braces or Orthoses: Spinal Brace Spinal Brace: Thoracolumbosacral orthotic;Applied in supine position Restrictions Weight  Bearing Restrictions: No      Mobility Bed Mobility Overal bed mobility: Needs Assistance Bed Mobility: Rolling, Sidelying to Sit Rolling: Supervision Sidelying to sit: Min guard       General bed mobility comments: HOB at 20 degrees. Cues for log rolling only    Transfers Overall transfer level: Needs assistance Equipment used: Rolling walker (2 wheels) Transfers: Sit to/from Stand, Bed to chair/wheelchair/BSC Sit to Stand: Min guard     Step pivot transfers: Min guard     General transfer comment: cues for hand placement      Balance Overall balance assessment: No apparent balance deficits (not formally assessed)                                         ADL either performed or assessed with clinical judgement   ADL Overall ADL's : Needs assistance/impaired Eating/Feeding: Independent;Sitting Eating/Feeding Details (indicate cue type and reason): struggles to eat due to tremors in B arms. This has been present for years adn pt has tried build up utensils etc that do not work. Was supposed to see dr. about it but had this surgery instead. Grooming: Wash/dry hands;Wash/dry face;Oral care;Set up;Standing Grooming Details (indicate cue type and reason): Pt stood at sink for 2 minutes Upper Body Bathing: Set up;Sitting   Lower Body Bathing: Moderate assistance;Sit to/from stand;Cueing for compensatory techniques Lower Body Bathing Details (indicate cue type and reason): Pt's pain makes it difficulty to access below knees to bathe Upper Body Dressing : Minimal assistance;Sitting Upper Body Dressing Details (indicate cue type and reason): assist with brace  Lower Body Dressing: Moderate assistance;Sit to/from stand;Cueing for compensatory techniques;Cueing for back precautions Lower Body Dressing Details (indicate cue type and reason): assist with socks and shoes and starting pants over legs.  Will introduce equipment Toilet Transfer: Minimal  assistance;Comfort height toilet;Grab bars Toilet Transfer Details (indicate cue type and reason): Pt walked to bathroom to toilet Toileting- Clothing Manipulation and Hygiene: Sit to/from stand;Min guard       Functional mobility during ADLs: Min guard;Rolling walker (2 wheels) General ADL Comments: Pt most limited with LE adls due to pain and decreased ROM     Vision Baseline Vision/History: 1 Wears glasses Ability to See in Adequate Light: 0 Adequate Patient Visual Report: No change from baseline Vision Assessment?: No apparent visual deficits     Perception Perception Perception Tested?: No   Praxis Praxis Praxis tested?: Within functional limits    Pertinent Vitals/Pain Pain Assessment Pain Assessment: Faces Faces Pain Scale: Hurts even more Pain Location: back Pain Descriptors / Indicators: Aching, Discomfort, Grimacing, Guarding, Sore Pain Intervention(s): Limited activity within patient's tolerance, Monitored during session, Repositioned     Hand Dominance Right   Extremity/Trunk Assessment Upper Extremity Assessment Upper Extremity Assessment: RUE deficits/detail;LUE deficits/detail RUE Deficits / Details: signficant tremors that have been present for years RUE Sensation: WNL LUE Deficits / Details: signficant tremors LUE Sensation: WNL   Lower Extremity Assessment Lower Extremity Assessment: Defer to PT evaluation   Cervical / Trunk Assessment Cervical / Trunk Assessment: Back Surgery   Communication Communication Communication: No difficulties   Cognition Arousal/Alertness: Awake/alert Behavior During Therapy: WFL for tasks assessed/performed Overall Cognitive Status: Within Functional Limits for tasks assessed                                       General Comments  Pt with drain in back. Pt cont with pain.    Exercises     Shoulder Instructions      Home Living Family/patient expects to be discharged to:: Private  residence Living Arrangements: Parent Available Help at Discharge: Family;Available 24 hours/day Type of Home: House Home Access: Stairs to enter [employer] of Steps: 6 Entrance Stairs-Rails: Right;Left Home Layout: One level     Bathroom Shower/Tub: Walk-in shower;Door   [employer] Toilet: Standard Bathroom Accessibility: Yes How Accessible: Accessible via walker Home Equipment: Conservation officer, nature (2 wheels);Cane - single point;Shower seat;BSC/3in1   Additional Comments: Pt uses cane at home      Prior Functioning/Environment Prior Level of Function : Independent/Modified Independent             Mobility Comments: walks with cane ADLs Comments: mod I but difficulty with socks and shoes        OT Problem List: Decreased knowledge of use of DME or AE;Decreased knowledge of precautions;Pain      OT Treatment/Interventions: Self-care/ADL training;Therapeutic activities;DME and/or AE instruction    OT Goals(Current goals can be found in the care plan section) Acute Rehab OT Goals Patient Stated Goal: to get rid of pain OT Goal Formulation: With patient Time For Goal Achievement: 09/07/22 Potential to Achieve Goals: Good ADL Goals Pt Will Perform Lower Body Bathing: with supervision;with adaptive equipment Pt Will Perform Lower Body Dressing: with supervision;with adaptive equipment Additional ADL Goal #1: Pt will walk to bathroom and complete all toileting with mod I Additional ADL Goal #2: Pt will donn brace [place]  OT Frequency: Min 2X/week    Co-evaluation  AM-PAC OT "6 Clicks" Daily Activity     Outcome Measure Help from another person eating meals?: None Help from another person taking care of personal grooming?: None Help from another person toileting, which includes using toliet, bedpan, or urinal?: A Little Help from another person bathing (including washing, rinsing, drying)?: A Little Help from another person to put on and taking  off regular upper body clothing?: A Little Help from another person to put on and taking off regular lower body clothing?: A Lot 6 Click Score: 19   End of Session Equipment Utilized During Treatment: Rolling walker (2 wheels);Back brace Nurse Communication: Mobility status  Activity Tolerance: Patient tolerated treatment well Patient left: in bed;with call bell/phone within reach;Other (comment) (PT coming in to see pt)  OT Visit Diagnosis: Unsteadiness on feet (R26.81)                Time: 2585-2778 OT Time Calculation (min): 26 min Charges:  OT General Charges $OT Visit: 1 Visit OT Evaluation $OT Eval Moderate Complexity: 1 Mod OT Treatments $Self Care/Home Management : 8-22 mins  Glenford Peers 08/24/2022, 8:57 AM

## 2022-08-24 NOTE — TOC Initial Note (Signed)
Transition of Care [facility]) - Initial/Assessment Note    Patient Details  Name: Keith Gibbs MRN: [id number] Date of Birth: 1960-10-16  [facility]) CM/SW Contact:    Pollie Friar, RN Phone Number: 08/24/2022, 4:05 PM  Clinical Narrative:                 Pt is from home with his mother. He states she can assist him as needed.  Pts mother provides needed transportation and oversees his medications.  No f/u per PT/OT and no DME needs.  TOC following.  Expected Discharge Plan: Home/Self Care Barriers to Discharge: Continued Medical Work up   Patient Goals and CMS Choice            Expected Discharge Plan and Services   Discharge Planning Services: CM Consult   Living arrangements for the past 2 months: Single Family Home                                      Prior Living Arrangements/Services Living arrangements for the past 2 months: Single Family Home Lives with:: Parents Patient language and need for interpreter reviewed:: Yes Do you feel safe going back to the place where you live?: Yes        Care giver support system in place?: Yes (comment)      Activities of Daily Living Home Assistive Devices/Equipment: Cane (specify quad or straight) (quad) ADL Screening (condition at time of admission) Patient's cognitive ability adequate to safely complete daily activities?: Yes Is the patient deaf or have difficulty hearing?: Yes Does the patient have difficulty seeing, even when wearing glasses/contacts?: No Does the patient have difficulty concentrating, remembering, or making decisions?: No Patient able to express need for assistance with ADLs?: Yes Does the patient have difficulty dressing or bathing?: No Independently performs ADLs?: Yes (appropriate for developmental age) Does the patient have difficulty walking or climbing stairs?: Yes Weakness of Legs: None Weakness of Arms/Hands: None  Permission Sought/Granted                   Emotional Assessment Appearance:: Appears stated age Attitude/Demeanor/Rapport: Engaged Affect (typically observed): Accepting Orientation: : Oriented to Self, Oriented to Place, Oriented to  Time, Oriented to Situation   Psych Involvement: No (comment)  Admission diagnosis:  S/P lumbar fusion [Z98.1] Patient Active Problem List   Diagnosis Date Noted   S/P lumbar fusion 08/23/2022   Postlaminectomy syndrome, not elsewhere classified 08/12/2022   MDD (major depressive disorder) 08/10/2022   Substance induced mood disorder ([place]) 08/06/2022   Suicide attempt by drug overdose ([place]) 08/06/2022   Toxic encephalopathy 08/03/2022   Suicidal ideation 08/03/2022   Encephalopathy 08/03/2022   Polysubstance abuse ([place]) 04/28/2022   Postoperative abdominal pain 02/28/2022   Acute cholecystitis 02/17/2022   Obstruction of nasal valve 01/07/2022   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   Pulmonary nodule 12/17/2021   CAD S/P percutaneous coronary angioplasty 12/17/2021   Epigastric abdominal pain    Nausea and vomiting    Tobacco dependence due to cigarettes 08/31/2021   Iron deficiency anemia secondary to inadequate dietary iron intake 08/31/2021   Acute lead-induced gout involving toe of left foot 08/31/2021   Encounter for lipid screening for cardiovascular disease 08/31/2021   Hypotension due to drugs 06/21/2021   Opioid withdrawal ([place]) 06/21/2021   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[place]) 06/21/2021   RLS (restless legs syndrome) 05/07/2021   Acquired deviated  nasal septum 05/03/2021   Encounter for surveillance of abnormal nevi 05/03/2021   Flu vaccine need 05/03/2021   Substance abuse ([place]) 05/03/2021   Drug abuse and dependence ([place]) 05/03/2021   Acute midline low back pain without sciatica 05/03/2021   Need for shingles vaccine 05/03/2021   Right groin pain 01/29/2021   S/P TKR (total knee replacement) using cement, right 01/19/2021   Cigarette smoker 07/21/2020   Foot pain, bilateral 12/25/2019    Potential exposure to STD 12/12/2019   History of lumbar surgery 12/12/2019   Fatigue 12/12/2019   Penile lesion 12/12/2019   Right testicular pain 12/12/2019   Body mass index 28.0-28.9, adult 12/12/2019   History of COPD 12/12/2019   Tobacco use disorder, continuous 12/12/2019   Hepatoma ([place]) 11/05/2019   Anxiety 12/10/2018   Unstable angina ([place]) 11/30/2018   Essential hypertension 09/18/2018   Heart valve disorder 09/18/2018   Hypercholesteremia 09/18/2018   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   Chronic anticoagulation (Plavix) 04/19/2018   Pain medication agreement broken ([place]) 04/19/2018   Cocaine use 04/18/2018   Cocaine abuse ([place]) (See 04/12/18 UDS) 04/18/2018   Marijuana abuse (See 04/12/18 UDS) 04/18/2018   Long term current use of opiate analgesic 04/12/2018   Cirrhosis of liver ([place]) 04/03/2018   BPH (benign prostatic hyperplasia) 04/03/2018   Hematuria, microscopic 04/03/2018   Osteoarthritis of the knee (Left) 11/02/2017   Lumbar facet syndrome (Bilateral) 11/01/2017   Spondylosis without myelopathy or radiculopathy, lumbosacral region 11/01/2017   Osteoarthritis of shoulder (Right) 11/01/2017   Osteoarthritis of acromioclavicular joint (Right) 11/01/2017   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) 11/01/2017   Osteoarthritis 11/01/2017   Cervicalgia 11/01/2017   Cervical facet syndrome 11/01/2017   DDD (degenerative disc disease), cervical 11/01/2017   DDD (degenerative disc disease), lumbar 11/01/2017   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) 09/18/2017   Other chronic pain 09/18/2017   Chronic musculoskeletal pain 09/18/2017   Chronic pain of right knee 09/11/2017   Chronic low back pain (Secondary Area of Pain) (Bilateral) 09/11/2017   Chronic hip pain (Tertiary Area of Pain) (Bilateral) (R>L) 09/11/2017   Chronic shoulder pain (Fourth Area of Pain) (Right) 09/11/2017   Spondylosis without myelopathy or radiculopathy, cervical region  09/11/2017   Chronic pain syndrome 09/11/2017   Opiate use 09/11/2017   Screen for STD (sexually transmitted disease) 09/11/2017   Disorder of skeletal system 09/11/2017   Problems influencing health status 09/11/2017   Overdose of medication, intentional self-harm, sequela ([place]) 12/28/2016   Severe recurrent major depression without psychotic features ([place]) 12/27/2016   Coronary artery disease of native artery of native heart with stable angina pectoris ([place]) 12/27/2016   Alcohol use disorder, moderate, dependence ([place]) 12/27/2016   Cannabis use disorder, moderate, dependence ([place]) 12/27/2016   Angina at rest 06/11/2016   Chest pain 06/10/2016   Bipolar 1 disorder ([place]) 05/27/2013   Abnormal ejaculation 11/30/2012   Chest pain, unspecified 11/30/2012   Degenerative arthritis of hip 11/16/2012   Schizoaffective disorder ([place]) 06/27/2012   COPD (chronic obstructive pulmonary disease) ([place]) 06/27/2012   Chronic hepatitis C ([place]) 06/27/2012   GERD (gastroesophageal reflux disease) 06/27/2012   Complete rupture of rotator cuff 02/10/2012   Cocaine abuse in remission ([place]) 12/05/2011   Unspecified osteoarthritis, unspecified site 07/27/2011   PCP:  Gildardo Pounds, NP Pharmacy:   [id number] [address]Phone: [phone]: [phone]    Social Determinants  of Health (SDOH) Social History: SDOH Screenings   Food Insecurity: No Food Insecurity (08/23/2022)  Housing: Low Risk  (08/23/2022)  Transportation Needs: No Transportation Needs (08/23/2022)  Utilities: Not At Risk (08/23/2022)  Alcohol Screen: Low Risk  (08/05/2022)  Depression (PHQ2-9): High Risk (08/01/2022)  Tobacco Use: High Risk (08/24/2022)   SDOH Interventions:     Readmission Risk Interventions    03/01/2022   11:27 AM  Readmission Risk Prevention Plan  Transportation Screening Complete  HRI or [place] Complete   Social Work Consult for [place] Planning/Counseling Complete  Palliative Care Screening Not Applicable  Medication Review Press photographer) Complete

## 2022-08-24 NOTE — Progress Notes (Signed)
Subjective: Patient reports some left anterior thigh numbness, but preoperative leg pain resolved.  Objective: Vital signs in last 24 hours: Temp:  [97.8 F (36.6 C)-98.2 F (36.8 C)] 98.1 F (36.7 C) (01/31 1122) Pulse Rate:  [60-72] 61 (01/31 1122) Resp:  [11-27] 18 (01/31 1122) BP: (121-159)/(64-92) 121/76 (01/31 1122) SpO2:  [92 %-98 %] 96 % (01/31 1122)  Intake/Output from previous day: 01/30 0701 - 01/31 0700 In: 4310 [P.O.:960; I.V.:3350] Out: 3330 [Urine:2580; Drains:450; Blood:300] Intake/Output this shift: No intake/output data recorded.  NAD Dressings c/d 5/5 strength KE, DF, PF bilaterally  Lab Results: Recent Labs    08/23/22 1326 08/24/22 0619  WBC  --  13.5*  HGB 12.2* 10.9*  HCT 36.0* 32.4*  PLT  --  257   BMET Recent Labs    08/23/22 1326 08/24/22 0619  NA 135 134*  K 4.8 3.9  CL  --  102  CO2  --  26  GLUCOSE  --  126*  BUN  --  13  CREATININE  --  0.96  CALCIUM  --  8.1*    Studies/Results: DG Lumbar Spine 2-3 Views  Result Date: 08/23/2022 CLINICAL DATA:  Elective surgery. EXAM: LUMBAR SPINE-intraoperative fluoroscopy COMPARISON:  X-ray 06/03/2022 FINDINGS: Two fluoroscopic spot images submitted for review demonstrates pedicle screws seen at 3 adjacent levels of the lower lumbar spine with prosthetic disc material. Surgical instruments. Imaging was obtained to aid in treatment. Please correlate with real-time fluoroscopy of 216.1 seconds. Cumulative dose 127.73 mGy IMPRESSION: Intraoperative fluoroscopy for lumbar spine surgery. Electronically Signed   By: Jill Side M.D.   On: 08/23/2022 15:02   DG C-Arm 1-60 Min-No Report  Result Date: 08/23/2022 Fluoroscopy was utilized by the requesting physician.  No radiographic interpretation.   DG C-Arm 1-60 Min-No Report  Result Date: 08/23/2022 Fluoroscopy was utilized by the requesting physician.  No radiographic interpretation.   DG C-Arm 1-60 Min-No Report  Result Date:  08/23/2022 Fluoroscopy was utilized by the requesting physician.  No radiographic interpretation.   DG C-Arm 1-60 Min-No Report  Result Date: 08/23/2022 Fluoroscopy was utilized by the requesting physician.  No radiographic interpretation.   DG C-Arm 1-60 Min-No Report  Result Date: 08/23/2022 Fluoroscopy was utilized by the requesting physician.  No radiographic interpretation.    Assessment/Plan: S/p L2-3, L3-4 DLIF, posterior decompression and extension of fusion - cont JP - lovenox - possible d/c tomorrow   Vallarie Mare 08/24/2022, 1:45 PM

## 2022-08-25 NOTE — Plan of Care (Signed)
  Problem: Education: Goal: Knowledge of General Education information will improve Description: Including pain rating scale, medication(s)/side effects and non-pharmacologic comfort measures Outcome: Progressing   Problem: Health Behavior/Discharge Planning: Goal: Ability to manage health-related needs will improve Outcome: Progressing   Problem: Clinical Measurements: Goal: Ability to maintain clinical measurements within normal limits will improve Outcome: Progressing Goal: Will remain free from infection Outcome: Progressing Goal: Diagnostic test results will improve Outcome: Progressing Goal: Cardiovascular complication will be avoided Outcome: Progressing   Problem: Activity: Goal: Risk for activity intolerance will decrease Outcome: Progressing   Problem: Nutrition: Goal: Adequate nutrition will be maintained Outcome: Progressing   Problem: Coping: Goal: Level of anxiety will decrease Outcome: Progressing   Problem: Elimination: Goal: Will not experience complications related to bowel motility Outcome: Progressing Goal: Will not experience complications related to urinary retention Outcome: Progressing   Problem: Pain Managment: Goal: General experience of comfort will improve Outcome: Progressing   Problem: Safety: Goal: Ability to remain free from injury will improve Outcome: Progressing   Problem: Skin Integrity: Goal: Risk for impaired skin integrity will decrease Outcome: Progressing   Problem: Education: Goal: Ability to verbalize activity precautions or restrictions will improve Outcome: Progressing Goal: Knowledge of the prescribed therapeutic regimen will improve Outcome: Progressing Goal: Understanding of discharge needs will improve Outcome: Progressing   Problem: Activity: Goal: Ability to avoid complications of mobility impairment will improve Outcome: Progressing Goal: Ability to tolerate increased activity will improve Outcome:  Progressing Goal: Will remain free from falls Outcome: Progressing   Problem: Bowel/Gastric: Goal: Gastrointestinal status for postoperative course will improve Outcome: Progressing   Problem: Clinical Measurements: Goal: Ability to maintain clinical measurements within normal limits will improve Outcome: Progressing Goal: Postoperative complications will be avoided or minimized Outcome: Progressing Goal: Diagnostic test results will improve Outcome: Progressing   Problem: Pain Management: Goal: Pain level will decrease Outcome: Progressing   Problem: Skin Integrity: Goal: Will show signs of wound healing Outcome: Progressing   Problem: Health Behavior/Discharge Planning: Goal: Identification of resources available to assist in meeting health care needs will improve Outcome: Progressing   Problem: Bladder/Genitourinary: Goal: Urinary functional status for postoperative course will improve Outcome: Progressing   Problem: Education: Goal: Ability to verbalize activity precautions or restrictions will improve Outcome: Progressing Goal: Knowledge of the prescribed therapeutic regimen will improve Outcome: Progressing Goal: Understanding of discharge needs will improve Outcome: Progressing   Problem: Activity: Goal: Ability to avoid complications of mobility impairment will improve Outcome: Progressing Goal: Ability to tolerate increased activity will improve Outcome: Progressing Goal: Will remain free from falls Outcome: Progressing   Problem: Bowel/Gastric: Goal: Gastrointestinal status for postoperative course will improve Outcome: Progressing   Problem: Clinical Measurements: Goal: Ability to maintain clinical measurements within normal limits will improve Outcome: Progressing Goal: Postoperative complications will be avoided or minimized Outcome: Progressing Goal: Diagnostic test results will improve Outcome: Progressing   Problem: Pain Management: Goal:  Pain level will decrease Outcome: Progressing   Problem: Skin Integrity: Goal: Will show signs of wound healing Outcome: Progressing   Problem: Health Behavior/Discharge Planning: Goal: Identification of resources available to assist in meeting health care needs will improve Outcome: Progressing   Problem: Bladder/Genitourinary: Goal: Urinary functional status for postoperative course will improve Outcome: Progressing

## 2022-08-25 NOTE — Progress Notes (Signed)
Mobility Specialist: Progress Note   08/25/22 1646  Mobility  Activity Ambulated with assistance in hallway  Level of Assistance Contact guard assist, steadying assist  Assistive Device Front wheel walker  Distance Ambulated (ft) 300 ft  Activity Response Tolerated well  Mobility Referral Yes  $Mobility charge 1 Mobility   Pt received sitting EOB and agreeable to mobility. Mod I to stand and contact guard during ambulation. C/o back pain during ambulation, no rating given. Pt otherwise asymptomatic. Pt to the chair after session per request with call bell and phone at his side.   Sheldon Ravonda Brecheen Mobility Specialist Please contact via SecureChat or Rehab office at [phone]

## 2022-08-25 NOTE — Plan of Care (Signed)
  Problem: Education: Goal: Knowledge of General Education information will improve Description: Including pain rating scale, medication(s)/side effects and non-pharmacologic comfort measures Outcome: Progressing   Problem: Health Behavior/Discharge Planning: Goal: Ability to manage health-related needs will improve Outcome: Progressing   Problem: Clinical Measurements: Goal: Ability to maintain clinical measurements within normal limits will improve Outcome: Progressing Goal: Will remain free from infection Outcome: Progressing Goal: Cardiovascular complication will be avoided Outcome: Progressing   Problem: Activity: Goal: Risk for activity intolerance will decrease Outcome: Progressing   Problem: Nutrition: Goal: Adequate nutrition will be maintained Outcome: Progressing   Problem: Coping: Goal: Level of anxiety will decrease Outcome: Progressing   Problem: Elimination: Goal: Will not experience complications related to urinary retention Outcome: Progressing   Problem: Pain Managment: Goal: General experience of comfort will improve Outcome: Progressing   Problem: Safety: Goal: Ability to remain free from injury will improve Outcome: Progressing   Problem: Skin Integrity: Goal: Risk for impaired skin integrity will decrease Outcome: Progressing   Problem: Activity: Goal: Ability to avoid complications of mobility impairment will improve Outcome: Progressing Goal: Ability to tolerate increased activity will improve Outcome: Progressing Goal: Will remain free from falls Outcome: Progressing

## 2022-08-25 NOTE — Progress Notes (Signed)
Subjective: Patient reports pain better-controlled  Objective: Vital signs in last 24 hours: Temp:  [98 F (36.7 C)-98.3 F (36.8 C)] 98.3 F (36.8 C) (02/01 1300) Pulse Rate:  [54-72] 65 (02/01 1300) Resp:  [16-20] 20 (02/01 1300) BP: (84-122)/(55-67) 122/67 (02/01 1300) SpO2:  [92 %-98 %] 98 % (02/01 1300)  Intake/Output from previous day: 01/31 0701 - 02/01 0700 In: 243 [P.O.:240; I.V.:3] Out: 135 [Drains:135] Intake/Output this shift: Total I/O In: -  Out: 50 [Drains:50]  NAD Dressings c/d Full strength KE, DF, PF bilaterally  Lab Results: Recent Labs    08/23/22 1326 08/24/22 0619  WBC  --  13.5*  HGB 12.2* 10.9*  HCT 36.0* 32.4*  PLT  --  257   BMET Recent Labs    08/23/22 1326 08/24/22 0619  NA 135 134*  K 4.8 3.9  CL  --  102  CO2  --  26  GLUCOSE  --  126*  BUN  --  13  CREATININE  --  0.96  CALCIUM  --  8.1*    Studies/Results: No results found.  Assessment/Plan: S/p L2-4 DLIF, extension posterior fusion  LOS: 2 days  - cont JP - will discharge home tomorrow   Keith Gibbs 08/25/2022, 3:19 PM

## 2022-08-25 NOTE — Progress Notes (Signed)
Occupational Therapy Treatment Patient Details Name: Keith Gibbs MRN: [id number] DOB: 11/18/1960 Today's Date: 08/25/2022   History of present illness Pt is a 62 yo male admitted for redo PSIF from L2-L4 due to difficulty ambulating and pain down L leg.  PHM: PSIF 10 years ago, bipolar d/o, substance abuse, R TKR, COPD, GERD.   OT comments  Pt has made great progress with all adls and is ready for d/c home with mother from OT standpoint.  Pt has all needed equipment to be mod I with his mother.  Mother was there for education and saw how independent pt was in room and walking in hallway.  No further OT needs at this time.   Recommendations for follow up therapy are one component of a multi-disciplinary discharge planning process, led by the attending physician.  Recommendations may be updated based on patient status, additional functional criteria and insurance authorization.    Follow Up Recommendations  No OT follow up     Assistance Recommended at Discharge PRN  Patient can return home with the following  Assistance with cooking/housework;Assist for transportation;Help with stairs or ramp for entrance   Equipment Recommendations  None recommended by OT    Recommendations for Other Services      Precautions / Restrictions Precautions Required Braces or Orthoses: Spinal Brace Spinal Brace: Thoracolumbosacral orthotic;Applied in supine position       Mobility Bed Mobility Overal bed mobility: Needs Assistance Bed Mobility: Rolling, Sidelying to Sit, Sit to Sidelying Rolling: Modified independent (Device/Increase time) Sidelying to sit: Modified independent (Device/Increase time)     Sit to sidelying: Supervision General bed mobility comments: Moving well in bed without bedrails.    Transfers Overall transfer level: Needs assistance Equipment used: Rolling walker (2 wheels) Transfers: Sit to/from Stand Sit to Stand: Modified independent (Device/Increase time)            General transfer comment: No physical assist needed     Balance Overall balance assessment: No apparent balance deficits (not formally assessed)                                         ADL either performed or assessed with clinical judgement   ADL Overall ADL's : Needs assistance/impaired Eating/Feeding: Independent;Sitting   Grooming: Wash/dry hands;Wash/dry face;Oral care;Supervision/safety;Standing Grooming Details (indicate cue type and reason): pt stood at sink for extended time with mod I by end of session         Upper Body Dressing : Supervision/safety;Sitting Upper Body Dressing Details (indicate cue type and reason): donned brace Ily today Lower Body Dressing: Supervision/safety;Sit to/from stand;Cueing for compensatory techniques Lower Body Dressing Details (indicate cue type and reason): Pt used reacher and sock aid with cues only. No physical assist needed. Toilet Transfer: Supervision/safety;Rolling walker (2 wheels)   Toileting- Clothing Manipulation and Hygiene: Supervision/safety;Sit to/from stand       Functional mobility during ADLs: Supervision/safety;Rolling walker (2 wheels) General ADL Comments: Pt doing well with all adl. Pt now supervision to mod I with all adls and mother present for session to see how independent pt is and was more comfortable with idea of taking him home with her.    Extremity/Trunk Assessment Upper Extremity Assessment Upper Extremity Assessment: Overall WFL for tasks assessed            Vision   Vision Assessment?: No apparent visual deficits   Perception Perception  Perception: Within Functional Limits   Praxis Praxis Praxis: Intact    Cognition Arousal/Alertness: Awake/alert Behavior During Therapy: WFL for tasks assessed/performed Overall Cognitive Status: Within Functional Limits for tasks assessed                                          Exercises      Shoulder  Instructions       General Comments Pt progressing well and ready for d/c home with mother around to assist with small things    Pertinent Vitals/ Pain       Pain Assessment Pain Assessment: 0-10 Pain Score: 3  Pain Location: back Pain Descriptors / Indicators: Aching Pain Intervention(s): Limited activity within patient's tolerance, Monitored during session, Repositioned  Home Living                                          Prior Functioning/Environment              Frequency  Min 2X/week        Progress Toward Goals  OT Goals(current goals can now be found in the care plan section)  Progress towards OT goals: Goals met/education completed, patient discharged from OT  Acute Rehab OT Goals Patient Stated Goal: to go home OT Goal Formulation: With patient Time For Goal Achievement: 09/07/22 Potential to Achieve Goals: Good ADL Goals Pt Will Perform Lower Body Bathing: with supervision;with adaptive equipment Pt Will Perform Lower Body Dressing: with supervision;with adaptive equipment Additional ADL Goal #1: Pt will walk to bathroom and complete all toileting with mod I Additional ADL Goal #2: Pt will donn brace Ily  Plan Discharge plan remains appropriate    Co-evaluation                 AM-PAC OT "6 Clicks" Daily Activity     Outcome Measure   Help from another person eating meals?: None Help from another person taking care of personal grooming?: None Help from another person toileting, which includes using toliet, bedpan, or urinal?: None Help from another person bathing (including washing, rinsing, drying)?: None Help from another person to put on and taking off regular upper body clothing?: None Help from another person to put on and taking off regular lower body clothing?: A Little 6 Click Score: 23    End of Session Equipment Utilized During Treatment: Rolling walker (2 wheels);Back brace  OT Visit Diagnosis: Unsteadiness  on feet (R26.81)   Activity Tolerance Patient tolerated treatment well   Patient Left in chair;with call bell/phone within reach;with family/visitor present   Nurse Communication Mobility status        Time: 0737-1062 OT Time Calculation (min): 23 min  Charges: OT General Charges $OT Visit: 1 Visit OT Treatments $Self Care/Home Management : 23-37 mins   Glenford Peers 08/25/2022, 11:40 AM

## 2022-08-25 NOTE — Plan of Care (Signed)
All goals met

## 2022-08-26 ENCOUNTER — Other Ambulatory Visit: Payer: Self-pay | Admitting: Family Medicine

## 2022-08-26 DIAGNOSIS — M545 Low back pain, unspecified: Secondary | ICD-10-CM

## 2022-08-26 MED ORDER — CYCLOBENZAPRINE HCL 10 MG PO TABS: 10.0000 mg | ORAL_TABLET | Freq: Three times a day (TID) | ORAL | 0 refills | Status: DC | PRN

## 2022-08-26 MED ORDER — DOCUSATE SODIUM 100 MG PO CAPS: 100.0000 mg | ORAL_CAPSULE | Freq: Two times a day (BID) | ORAL | 2 refills | Status: DC

## 2022-08-26 MED ORDER — OXYCODONE-ACETAMINOPHEN 10-325 MG PO TABS: 1.0000 | ORAL_TABLET | Freq: Four times a day (QID) | ORAL | 0 refills | Status: DC | PRN

## 2022-08-26 NOTE — Progress Notes (Signed)
Mobility Specialist: Progress Note   08/26/22 0952  Mobility  Activity Ambulated with assistance in hallway  Level of Assistance Standby assist, set-up cues, supervision of patient - no hands on  Assistive Device Front wheel walker  Distance Ambulated (ft) 300 ft  Activity Response Tolerated well  Mobility Referral Yes  $Mobility charge 1 Mobility   Pt received in the BR and agreeable to mobility once finished. C/o 8/10 back pain during ambulation, otherwise asymptomatic. Pt sitting EOB after session with call bell at his side.   Keith Gibbs Mobility Specialist Please contact via SecureChat or Rehab office at [phone]

## 2022-08-26 NOTE — TOC Transition Note (Signed)
Transition of Care [facility]) - CM/SW Discharge Note   Patient Details  Name: Keith Gibbs MRN: [id number] Date of Birth: [birth year]  [facility] of [facility]) CM/SW Contact:  Pollie Friar, RN Phone Number: 08/26/2022, 1:10 PM   Clinical Narrative:    Pt is discharging home with self care. No f/u per PT/OT and no new DME needs.  Pt has assistance at home and transportation to home.    Final next level of care: Home/Self Care Barriers to Discharge: No Barriers Identified   Patient Goals and CMS Choice      Discharge Placement                         Discharge Plan and Services Additional resources added to the After Visit Summary for     Discharge Planning Services: CM Consult                                 Social Determinants of Health (SDOH) Interventions SDOH Screenings   Food Insecurity: No Food Insecurity (08/23/2022)  Housing: Low Risk  (08/23/2022)  Transportation Needs: No Transportation Needs (08/23/2022)  Utilities: Not At Risk (08/23/2022)  Alcohol Screen: Low Risk  (08/05/2022)  Depression (PHQ2-9): High Risk (08/01/2022)  Tobacco Use: High Risk (08/24/2022)     Readmission Risk Interventions    03/01/2022   11:27 AM  Readmission Risk Prevention Plan  Transportation Screening Complete  HRI or Lane Complete  Social Work Consult for [place] Planning/Counseling Complete  Palliative Care Screening Not Applicable  Medication Review Press photographer) Complete

## 2022-08-26 NOTE — Discharge Summary (Signed)
Physician Discharge Summary  Patient ID: Keith Gibbs MRN: [id number] DOB/AGE: [birth year] 62 y.o.  Admit date: 08/23/2022 Discharge date: 08/26/2022  Admission Diagnoses:  S/p lumbar fusion  Discharge Diagnoses:  Same Principal Problem:   S/P lumbar fusion   Discharged Condition: Stable  Hospital Course:  Keith Gibbs is a 62 y.o. male with hx of L4-S1 PLIF who underwent elective L2-L4 fusion.  Postoperatively, he had mild left anterior thigh numbness but reported his preoperative radiculopathy symptoms had significantly improved.  PT and OT were consulted and he demonstrated good functional capacity.  His JP drain was removed and he was deemed ready for discharge on 2/2.  Treatments:  1.  L2-3  direct lateral lumbar interbody fusion via left retroperitoneal prone transpsoas approach 2.  L3-4  direct lateral lumbar interbody fusion via left retroperitoneal prone transpsoas approach 2.  L2-3, L3-4 posterolateral arthrodesis 3. Placement of interbody cages L2-3, L3-4 4. Exploration of previous fusion, removal of instrumentation L4-L5-S1 5. Segmental instrumentation with cortical pedicle screw and rod construct at L2-3-4 6. L3-4 posterior lumbar decompression, bilateral facetectomy and foraminotomy 7. L2-3 left laminotomy, facetecomy, and resection of degenerative cyst 5. Harvest of local autograft 6. Use of morselized allograft  Discharge Exam: Blood pressure (!) 117/58, pulse 60, temperature 98.3 F (36.8 C), temperature source Oral, resp. rate 19, height '6\' 3"'$  (1.905 m), weight 94.3 kg, SpO2 96 %. Awake, alert, oriented Speech fluent, appropriate CN grossly intact 5/5 BUE/BLE Wound c/d/I Mild left anterior thigh numbness  Disposition: Discharge disposition: 01-Home or Self Care       Discharge Instructions     Incentive spirometry RT   Complete by: As directed       Allergies as of 08/26/2022       Reactions   Latuda [lurasidone Hcl] Other (See Comments)    Tremors   Saphris [asenapine] Other (See Comments)   Increased tremors        Medication List     TAKE these medications    albuterol 108 (90 Base) MCG/ACT inhaler Commonly known as: VENTOLIN HFA Inhale 2 puffs into the lungs every 6 (six) hours as needed for wheezing or shortness of breath.   aspirin EC 81 MG tablet Take 1 tablet (81 mg total) by mouth daily. Swallow whole.   atorvastatin 80 MG tablet Commonly known as: LIPITOR Take 1 tablet (80 mg total) by mouth at bedtime.   benzonatate 100 MG capsule Commonly known as: TESSALON Take 1 capsule (100 mg total) by mouth 3 (three) times daily as needed.   Breztri Aerosphere 160-9-4.8 MCG/ACT Aero Generic drug: Budeson-Glycopyrrol-Formoterol Inhale 2 puffs into the lungs in the morning and at bedtime.   busPIRone 15 MG tablet Commonly known as: BUSPAR Take 1 tablet (15 mg total) by mouth daily.   cyclobenzaprine 10 MG tablet Commonly known as: FLEXERIL Take 1 tablet (10 mg total) by mouth 3 (three) times daily as needed for muscle spasms. What changed: when to take this   diclofenac Sodium 1 % Gel Commonly known as: VOLTAREN Apply 2 g topically 4 (four) times daily.   divalproex 500 MG DR tablet Commonly known as: Depakote Take 1 tablet (500 mg total) by mouth 2 (two) times daily.   docusate sodium 100 MG capsule Commonly known as: Colace Take 1 capsule (100 mg total) by mouth 2 (two) times daily.   DULoxetine 30 MG capsule Commonly known as: CYMBALTA Take 3 capsules (90 mg total) by mouth daily.   fluticasone 50 MCG/ACT  nasal spray Commonly known as: FLONASE 1 puff Inhalation Once a day   gabapentin 300 MG capsule Commonly known as: NEURONTIN Take 1 capsule (300 mg total) by mouth 3 (three) times daily.   hydrOXYzine 25 MG tablet Commonly known as: ATARAX Take 1 tablet (25 mg total) by mouth 3 (three) times daily as needed for anxiety.   loratadine 10 MG tablet Commonly known as: CLARITIN Take  1 tablet (10 mg total) by mouth daily.   nicotine 21 mg/24hr patch Commonly known as: NICODERM CQ - dosed in mg/24 hours Place 1 patch (21 mg total) onto the skin daily.   oxyCODONE-acetaminophen 10-325 MG tablet Commonly known as: Percocet Take 1 tablet by mouth every 6 (six) hours as needed for pain.   rOPINIRole 1 MG tablet Commonly known as: REQUIP Take 1 tablet (1 mg total) by mouth at bedtime.   traZODone 100 MG tablet Commonly known as: DESYREL Take 1 tablet (100 mg total) by mouth at bedtime as needed for sleep.        Follow-up Information     Vallarie Mare, MD. Schedule an appointment as soon as possible for a visit in 2 week(s).   Specialty: Neurosurgery Contact information: [address] [place] [address] [phone]                 Signed: Vallarie Mare 08/26/2022, 12:58 PM

## 2022-08-26 NOTE — Discharge Instructions (Signed)
Can shower Can remove transparent dressing in 48 hours Walk as much as possible No heavy lifting >10 lbs No excessive bending/twisting at the waist Wear LSO brace when OOB

## 2022-08-29 ENCOUNTER — Telehealth: Payer: Self-pay

## 2022-08-29 NOTE — Telephone Encounter (Signed)
Transition Care Management Follow-up Telephone Call Date of discharge and from where: 08/26/2022, [facility]  How have you been since you were released from the hospital? He said he is hurting real bad from the surgery.  He is wearing the back brace when he is up out of bed. He is concerned that he has not moved his bowels yet. He is taking the colace, has been increasing his fluids and has been up an walking as much as possible. He said he already called Dr Marcello Moores' office about the constipation and the nurse told him to just keep doing what he has been doing.  He said that the meloxicam was not ordered.  I explained that it has been removed from his med list.  He said he knows that but wants it added back to his med list and I said that is up to his providers.  Any questions or concerns? Yes- noted above  Items Reviewed: Did the pt receive and understand the discharge instructions provided? Yes  Medications obtained and verified? Yes - he said he has all of his medications and he did not have any questions about the med regime.  Other? No  Any new allergies since your discharge? No  Dietary orders reviewed? Yes Do you have support at home? Yes - his mother  [place] and Equipment/Supplies: Were home health services ordered? no If so, what is the name of the agency? N/a  Has the agency set up a time to come to the patient's home? not applicable Were any new equipment or medical supplies ordered?  No What is the name of the medical supply agency? N/a Were you able to get the supplies/equipment? not applicable Do you have any questions related to the use of the equipment or supplies? No  Functional Questionnaire: (I = Independent and D = Dependent) ADLs: independent with personal care.  He said he just has to take his time. He has the necessary adaptive equipment to help with getting dressed.  Has RW for ambulation  His mother manages his medications   Follow up appointments  reviewed:  PCP Hospital f/u appt confirmed? Yes  Scheduled to see Durene Fruits, NP - 09/05/2022.  [facility] f/u appt confirmed? Yes  Scheduled to see Dr Marcello Moores - 09/09/2022.  Are transportation arrangements needed? No  If their condition worsens, is the pt aware to call PCP or go to the Emergency Dept.? Yes Was the patient provided with contact information for the PCP's office or ED? Yes, I also gave him the phone number for [facility].  Was to pt encouraged to call back with questions or concerns? Yes
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
## 2022-08-30 NOTE — Progress Notes (Signed)
TRANSITION OF CARE VISIT   Date of Admission: 08/23/2022  Date of Discharge: 08/26/2022  Transitions of Care Call: 08/29/2022  Discharged from: [facility]   Discharge Diagnosis:  Admission Diagnoses:  S/p lumbar fusion   Discharge Diagnoses:  Same Principal Problem:   S/P lumbar fusion  Summary of Admission per MD note: Discharged Condition: Stable   Hospital Course:  Keith Gibbs is a 62 y.o. male with hx of L4-S1 PLIF who underwent elective L2-L4 fusion.  Postoperatively, he had mild left anterior thigh numbness but reported his preoperative radiculopathy symptoms had significantly improved.  PT and OT were consulted and he demonstrated good functional capacity.  His JP drain was removed and he was deemed ready for discharge on 2/2.   Treatments:  1.  L2-3  direct lateral lumbar interbody fusion via left retroperitoneal prone transpsoas approach 2.  L3-4  direct lateral lumbar interbody fusion via left retroperitoneal prone transpsoas approach 2.  L2-3, L3-4 posterolateral arthrodesis 3. Placement of interbody cages L2-3, L3-4 4. Exploration of previous fusion, removal of instrumentation L4-L5-S1 5. Segmental instrumentation with cortical pedicle screw and rod construct at L2-3-4 6. L3-4 posterior lumbar decompression, bilateral facetectomy and foraminotomy 7. L2-3 left laminotomy, facetecomy, and resection of degenerative cyst 5. Harvest of local autograft 6. Use of morselized allograft  Follow-Ups: Schedule an appointment with Vallarie Mare, MD (Neurosurgery) in 2 weeks (09/09/2022)   Today's visit 09/05/2022: Patient presents today for hospital discharge follow-up. He is accompanied by his mother. Patient states since hospital discharge he is having back pain and feeling tired. Denies red flag symptoms. Patient's mother confirmed that patient has an appointment with Neurosurgery on 09/09/2022. Patient states  that he was established with Pain Management but "messed it up". Patient plans to discuss back pain during his appointment with Neurosurgery on 09/09/2022. States his dressing fell off of his back incision, denies symptoms. He is wearing his back brace. Patient wants to know if Meloxicam can be added back to his medication list. Patient informed that Meloxicam was discontinued because he is taking Aspirin at hospital discharge.   Patient/Caregiver self-reported problems/concerns: see above  MEDICATIONS  Medication Reconciliation conducted with patient/caregiver? (Yes/ No): yes  New medications prescribed/discontinued upon discharge? (Yes/No): yes  Barriers identified related to medications: no  LABS  Lab Reviewed (Yes/No/NA): yes  PHYSICAL EXAM:      09/05/2022   10:38 AM 08/26/2022   12:31 PM 08/26/2022    9:31 AM  Vitals with BMI  Weight 206 lbs    BMI [id number]    Systolic [id number]  Diastolic 67 58 68  Pulse 58 60 59    Physical Exam HENT:     Head: Normocephalic and atraumatic.  Eyes:     Extraocular Movements: Extraocular movements intact.     Conjunctiva/sclera: Conjunctivae normal.     Pupils: Pupils are equal, round, and reactive to light.  Cardiovascular:     Rate and Rhythm: Bradycardia present.     Pulses: Normal pulses.     Heart sounds: Normal heart sounds.  Pulmonary:     Effort: Pulmonary effort is normal.     Breath sounds: Normal breath sounds.  Musculoskeletal:     Cervical back: Normal, normal range of motion and neck supple.     Thoracic back: Normal.     Lumbar back: Normal.     Comments: Surgical incision clean/dry/intact. Back brace in place.   Neurological:     General: No focal deficit present.  Mental Status: He is alert and oriented to person, place, and time.  Psychiatric:        Mood and Affect: Mood normal.        Behavior: Behavior normal.     ASSESSMENT AND PLAN: 1. Hospital discharge follow-up - Reviewed hospital course, current  medications, ensured proper follow-up in place, and addressed concerns.   2. S/P lumbar fusion - Continue present management.  - Keep all scheduled appointments with Neurosurgery.  - Follow-up with primary provider as scheduled.    PATIENT EDUCATION PROVIDED: See AVS   FOLLOW-UP (Include any further testing or referrals):  - Keep all scheduled appointments with Neurosurgery.  - Follow-up with primary provider as scheduled.   Patient was given clear instructions to go to Emergency Department or return to medical center if symptoms don't improve, worsen, or new problems develop.The patient verbalized understanding.

## 2022-08-31 ENCOUNTER — Other Ambulatory Visit (HOSPITAL_COMMUNITY): Payer: Self-pay | Admitting: Psychiatry

## 2022-08-31 DIAGNOSIS — F250 Schizoaffective disorder, bipolar type: Secondary | ICD-10-CM

## 2022-08-31 MED ORDER — DIVALPROEX SODIUM 500 MG PO DR TAB: 500.0000 mg | DELAYED_RELEASE_TABLET | Freq: Two times a day (BID) | ORAL | 3 refills | Status: DC

## 2022-09-05 ENCOUNTER — Ambulatory Visit (INDEPENDENT_AMBULATORY_CARE_PROVIDER_SITE_OTHER): Payer: Medicaid Other | Admitting: Family

## 2022-09-05 VITALS — BP 104/67 | HR 58 | Temp 98.3°F | Resp 16 | Wt 206.0 lb

## 2022-09-05 DIAGNOSIS — Z09 Encounter for follow-up examination after completed treatment for conditions other than malignant neoplasm: Secondary | ICD-10-CM

## 2022-09-05 DIAGNOSIS — Z981 Arthrodesis status: Secondary | ICD-10-CM

## 2022-09-05 NOTE — Patient Instructions (Signed)

## 2022-09-05 NOTE — Progress Notes (Signed)
..  Pt presents for transition of care

## 2022-10-10 ENCOUNTER — Encounter: Payer: Self-pay | Admitting: Cardiovascular Disease

## 2022-10-10 ENCOUNTER — Ambulatory Visit (INDEPENDENT_AMBULATORY_CARE_PROVIDER_SITE_OTHER): Payer: Self-pay | Admitting: Cardiovascular Disease

## 2022-10-10 VITALS — BP 138/72 | HR 58 | Temp 96.4°F | Ht 75.0 in | Wt 206.4 lb

## 2022-10-10 DIAGNOSIS — F17210 Nicotine dependence, cigarettes, uncomplicated: Secondary | ICD-10-CM | POA: Diagnosis not present

## 2022-10-10 DIAGNOSIS — R0602 Shortness of breath: Secondary | ICD-10-CM | POA: Diagnosis not present

## 2022-10-10 DIAGNOSIS — I2510 Atherosclerotic heart disease of native coronary artery without angina pectoris: Secondary | ICD-10-CM

## 2022-10-10 DIAGNOSIS — I10 Essential (primary) hypertension: Secondary | ICD-10-CM | POA: Diagnosis not present

## 2022-10-10 DIAGNOSIS — R911 Solitary pulmonary nodule: Secondary | ICD-10-CM | POA: Diagnosis not present

## 2022-10-10 DIAGNOSIS — Z9861 Coronary angioplasty status: Secondary | ICD-10-CM

## 2022-10-10 NOTE — Patient Instructions (Signed)
Ct screening , echo, stress test

## 2022-10-10 NOTE — Progress Notes (Signed)
Cardiology Office Note   Date:  10/10/2022   ID:  NICOLUS METCALFE, DOB 16-Mar-1961, MRN [id number]  PCP:  Gildardo Pounds, NP  Cardiologist:  Neoma Laming, MD      History of Present Illness: CEEJAY MARRIN is a 62 y.o. male who presents for  Chief Complaint  Patient presents with   Follow-up    4 month follow up    Shortness of Breath This is a chronic problem. The current episode started more than 1 year ago. The problem has been gradually worsening. The treatment provided moderate relief.      Past Medical History:  Diagnosis Date   Anginal pain ([place])    Anxiety    Arthritis    Bipolar 1 disorder (HCC)    Bursitis    CAD (coronary artery disease)    Chronic pain    COPD (chronic obstructive pulmonary disease) (HCC)    Current use of long term anticoagulation    DAPT (ASA + clopidogrel)   Depression    Diverticulitis    Dyspnea    GERD (gastroesophageal reflux disease)    Grade I diastolic dysfunction    Hepatitis C 2012   No longer has Hep C   HLD (hyperlipidemia)    Hypertension    MI (myocardial infarction) ([place])    Polysubstance abuse (HCC)    cocaine, marijuana, ETOH   PUD (peptic ulcer disease)    S/P angioplasty with stent 06/10/2016   a.) 90% stenosis of pLAD to mLAD - 2.5 x 18 mm Xience Alpine (DES x 1) placed to pLAD   S/P PTCA (percutaneous transluminal coronary angioplasty) 12/04/2019   a.) 60% in stent restenosis of DES to pLAD; LVEF 65%.   Schizophrenia ([place])    Stroke [facility])    Valvular insufficiency    a.) Mild MR, TR, PR; mild to moderate AR on 03/05/2018 TTE     Past Surgical History:  Procedure Laterality Date   ABDOMINAL SURGERY     removed small piece of intestines due to [facility] Diverticulosis   ANTERIOR LAT LUMBAR FUSION N/A 08/23/2022   Procedure: DIRECT LATERAL INTERBODY FUSION  LUMBAR TWO- LUMBAR THREE, LUMBAR THREE-LUMBAR FOUR , EXPLORE AND EXTEND FUSION LUMBAR TWO-LUMBAR FIVE, POSTERIOR DECOMPRESSION LUMBAR THREE-LUMBAR  FOUR, LEFT LUMBAR TWO-LUMBAR THREE;  Surgeon: Vallarie Mare, MD;  Location: [place];  Service: Neurosurgery;  Laterality: N/A;   APPENDECTOMY     BACK SURGERY     CARDIAC CATHETERIZATION Left 06/10/2016   Procedure: Left Heart Cath and Coronary Angiography;  Surgeon: Dionisio David, MD;  Location: [place] CV LAB;  Service: Cardiovascular;  Laterality: Left;   CARDIAC CATHETERIZATION N/A 06/10/2016   Procedure: Coronary Stent Intervention;  Surgeon: Yolonda Kida, MD;  Location: [place] CV LAB;  Service: Cardiovascular;  Laterality: N/A;   CHOLECYSTECTOMY N/A 02/17/2022   Procedure: LAPAROSCOPIC CHOLECYSTECTOMY;  Surgeon: Kieth Brightly Arta Bruce, MD;  Location: [place];  Service: General;  Laterality: N/A;   COLON SURGERY     COLONOSCOPY     COLONOSCOPY WITH PROPOFOL N/A 01/05/2017   Procedure: COLONOSCOPY WITH PROPOFOL;  Surgeon: Jonathon Bellows, MD;  Location: [facility] ENDOSCOPY;  Service: Endoscopy;  Laterality: N/A;   COLONOSCOPY WITH PROPOFOL N/A 02/13/2020   Procedure: COLONOSCOPY WITH PROPOFOL;  Surgeon: Virgel Manifold, MD;  Location: [facility] ENDOSCOPY;  Service: Endoscopy;  Laterality: N/A;   CORONARY ANGIOPLASTY WITH STENT PLACEMENT     ESOPHAGOGASTRODUODENOSCOPY (EGD) WITH PROPOFOL N/A 01/05/2017   Procedure: ESOPHAGOGASTRODUODENOSCOPY (  EGD) WITH PROPOFOL;  Surgeon: Jonathon Bellows, MD;  Location: [facility] ENDOSCOPY;  Service: Endoscopy;  Laterality: N/A;   ESOPHAGOGASTRODUODENOSCOPY (EGD) WITH PROPOFOL N/A 02/13/2020   Procedure: ESOPHAGOGASTRODUODENOSCOPY (EGD) WITH PROPOFOL;  Surgeon: Virgel Manifold, MD;  Location: [facility] ENDOSCOPY;  Service: Endoscopy;  Laterality: N/A;   INTRAVASCULAR PRESSURE WIRE/FFR STUDY N/A 12/04/2019   Procedure: INTRAVASCULAR PRESSURE WIRE/FFR STUDY;  Surgeon: Sherren Mocha, MD;  Location: [place] CV LAB;  Service: Cardiovascular;  Laterality: N/A;   KNEE ARTHROSCOPY WITH MEDIAL MENISECTOMY Right 09/05/2017   Procedure: KNEE ARTHROSCOPY WITH  MEDIAL AND LATERAL  MENISECTOMY PARTIAL SYNOVECTOMY;  Surgeon: Hessie Knows, MD;  Location: [facility] ORS;  Service: Orthopedics;  Laterality: Right;   LEFT HEART CATH AND CORONARY ANGIOGRAPHY N/A 12/04/2019   Procedure: LEFT HEART CATH AND CORONARY ANGIOGRAPHY;  Surgeon: Sherren Mocha, MD;  Location: [place] CV LAB;  Service: Cardiovascular;  Laterality: N/A;   SHOULDER SURGERY Right 04/09/2012   SPINE SURGERY     TOTAL KNEE ARTHROPLASTY Right 01/19/2021   Procedure: TOTAL KNEE ARTHROPLASTY - Rachelle Hora to Assist;  Surgeon: Hessie Knows, MD;  Location: [facility] ORS;  Service: Orthopedics;  Laterality: Right;     Current Outpatient Medications  Medication Sig Dispense Refill   albuterol (VENTOLIN HFA) 108 (90 Base) MCG/ACT inhaler Inhale 2 puffs into the lungs every 6 (six) hours as needed for wheezing or shortness of breath. 18 g 6   aspirin EC 81 MG tablet Take 1 tablet (81 mg total) by mouth daily. Swallow whole. 30 tablet 12   atorvastatin (LIPITOR) 80 MG tablet Take 1 tablet (80 mg total) by mouth at bedtime. 30 tablet 0   Budeson-Glycopyrrol-Formoterol (BREZTRI AEROSPHERE) 160-9-4.8 MCG/ACT AERO Inhale 2 puffs into the lungs in the morning and at bedtime.     busPIRone (BUSPAR) 15 MG tablet Take 1 tablet (15 mg total) by mouth daily. 60 tablet 3   cyclobenzaprine (FLEXERIL) 10 MG tablet Take 1 tablet (10 mg total) by mouth 3 (three) times daily as needed for muscle spasms. 50 tablet 0   diclofenac Sodium (VOLTAREN) 1 % GEL Apply 2 g topically 4 (four) times daily. 2 g 0   divalproex (DEPAKOTE) 500 MG DR tablet Take 1 tablet (500 mg total) by mouth 2 (two) times daily. 60 tablet 3   DULoxetine (CYMBALTA) 30 MG capsule Take 3 capsules (90 mg total) by mouth daily. 90 capsule 3   gabapentin (NEURONTIN) 300 MG capsule Take 1 capsule (300 mg total) by mouth 3 (three) times daily. 90 capsule 3   hydrOXYzine (ATARAX) 25 MG tablet Take 1 tablet (25 mg total) by mouth 3 (three) times daily as  needed for anxiety. 90 tablet 3   oxyCODONE-acetaminophen (PERCOCET) 10-325 MG tablet Take 1 tablet by mouth every 6 (six) hours as needed for pain. 50 tablet 0   rOPINIRole (REQUIP) 1 MG tablet Take 1 tablet (1 mg total) by mouth at bedtime. 30 tablet 0   traZODone (DESYREL) 100 MG tablet Take 1 tablet (100 mg total) by mouth at bedtime as needed for sleep. 30 tablet 3   No current facility-administered medications for this visit.    Allergies:   Asenapine and Latuda [lurasidone hcl]    Social History:   reports that he has been smoking cigarettes. He has a 16.00 pack-year smoking history. He has never used smokeless tobacco. He reports current alcohol use of about 6.0 standard drinks of alcohol per week. He reports that he does not currently use drugs after  having used the following drugs: Cocaine and Marijuana.   Family History:  family history includes Depression in his mother; Early death in his father; Heart attack in his father; Heart disease in his father and mother; Hypertension in his father, mother, and sister; Osteoarthritis in his mother.    ROS:     Review of Systems  Constitutional: Negative.   HENT: Negative.    Eyes: Negative.   Respiratory:  Positive for shortness of breath.   Gastrointestinal: Negative.   Genitourinary: Negative.   Musculoskeletal: Negative.   Skin: Negative.   Neurological: Negative.   Endo/Heme/Allergies: Negative.   Psychiatric/Behavioral: Negative.    All other systems reviewed and are negative.     All other systems are reviewed and negative.    PHYSICAL EXAM: VS:  BP 138/72   Pulse (!) 58   Temp (!) 96.4 F (35.8 C) (Oral)   Ht 6\' 3"  (1.905 m)   Wt 206 lb 6.4 oz (93.6 kg)   SpO2 98%   BMI 25.80 kg/m  , BMI Body mass index is 25.8 kg/m. Last weight:  Wt Readings from Last 3 Encounters:  10/10/22 206 lb 6.4 oz (93.6 kg)  09/05/22 206 lb (93.4 kg)  08/23/22 208 lb (94.3 kg)     Physical Exam Vitals reviewed.   Constitutional:      Appearance: Normal appearance. He is normal weight.  HENT:     Head: Normocephalic.     Nose: Nose normal.     Mouth/Throat:     Mouth: Mucous membranes are moist.  Eyes:     Pupils: Pupils are equal, round, and reactive to light.  Cardiovascular:     Rate and Rhythm: Normal rate and regular rhythm.     Pulses: Normal pulses.     Heart sounds: Normal heart sounds.  Pulmonary:     Effort: Pulmonary effort is normal.  Abdominal:     General: Abdomen is flat. Bowel sounds are normal.  Musculoskeletal:        General: Normal range of motion.     Cervical back: Normal range of motion.  Skin:    General: Skin is warm.  Neurological:     General: No focal deficit present.     Mental Status: He is alert.  Psychiatric:        Mood and Affect: Mood normal.       EKG:   Recent Labs: 04/26/2022: Magnesium 1.9 08/03/2022: TSH 0.420 08/05/2022: ALT 18 08/24/2022: BUN 13; Creatinine, Ser 0.96; Hemoglobin 10.9; Platelets 257; Potassium 3.9; Sodium 134    Lipid Panel    Component Value Date/Time   CHOL 83 04/26/2022 1530   CHOL 92 (L) 08/31/2021 1408   TRIG 52 04/26/2022 1530   HDL 28 (L) 04/26/2022 1530   HDL 33 (L) 08/31/2021 1408   CHOLHDL 3.0 04/26/2022 1530   VLDL 10 04/26/2022 1530   LDLCALC 45 04/26/2022 1530   LDLCALC 41 08/31/2021 1408      REASON FOR VISIT  Referred by Dr.Darcy Barbara Humphrey Rolls.        TESTS  Imaging: Computed Tomographic Angiography:  Cardiac multidetector CT was performed paying particular attention to the coronary arteries for the diagnosis of: R 07.82. Diagnostic Drugs:  Administered iohexol (Omnipaque) through an antecubital vein and images from the examination were analyzed for the presence and extent of coronary artery disease, using 3D image processing software. 100 mL of non-ionic contrast (Omnipaque) was used.     TEST CONCLUSIONS  Quality of study:  Good  1-Calcium score: 546  2-Right dominant system.  3-RCA has  minor irregularities. LCX has minor luminor irregularities. LAD is severely calcified mid portion with distal high grad lesion. Consider Cath if having Chest Pain.      Neoma Laming MD  Electronically signed by: Neoma Laming     Date: 01/31/2022 14:12 REASON FOR VISIT  Visit for: Echocardiogram/R06.02  Sex:   Male         wt= 213   lbs.  BP=124/70  Height= 75   inches.        TESTS  Imaging: Echocardiogram:  An echocardiogram in (2-d) mode was performed and in Doppler mode with color flow velocity mapping was performed. The aortic valve cusps are abnormal 2.6   cm, flow velocity 1.65    m/s, and systolic calculated mean flow gradient 5   mmHg. Mitral valve diastolic peak flow velocity E .755     m/s and E/A ratio 1.5. Aortic root diameter 4.2    cm. The LVOT internal diameter 3.7 cm and flow velocity was abnormal 1.30    m/s. LV systolic dimension 99991111   cm, diastolic Q000111Q cm, posterior wall thickness 1.05   cm, fractional shortening 45.1 %, and EF 76.3 %. IVS thickness 0.943  cm. LA dimension 4.2 cm. Mitral Valve has Mild Regurgitation. Pulmonic Valve has Trace Regurgitation. Aortic Valve has Mild to Moderate Regurgitation. Tricuspid Valve has Trace Regurgitation.     ASSESSMENT  Technically adequate study.  Normal chamber sizes.  Normal left ventricular systolic function.  Normal right ventricular systolic function.  Normal right ventricular diastolic function.  Normal left ventricular wall motion.  Normal right ventricular wall motion.  Trace pulmonary regurgitation.  Trace tricuspid regurgitation.  Normal pulmonary artery pressure.  Mild mitral regurgitation.  Mild to Moderate aortic regurgitation.  No pericardial effusion.  Mildly dilated Left atrium  Mild LVH.     THERAPY   Referring physician: Dionisio David  Sonographer: Neoma Laming.      Neoma Laming MD  Electronically signed by: Neoma Laming     Date: 01/17/2022 12:44 Other studies  Reviewed: Additional studies/ records that were reviewed today include:  Review of the above records demonstrates:       No data to display            ASSESSMENT AND PLAN:    ICD-10-CM   1. Pulmonary nodule  R91.1 MYOCARDIAL PERFUSION IMAGING    PCV ECHOCARDIOGRAM COMPLETE    CT CHEST LUNG CANCER SCREENING LOW DOSE WO CONTRAST   will do screening fo lun cancer    2. Essential hypertension  I10 MYOCARDIAL PERFUSION IMAGING    PCV ECHOCARDIOGRAM COMPLETE    CT CHEST LUNG CANCER SCREENING LOW DOSE WO CONTRAST    3. CAD S/P percutaneous coronary angioplasty  I25.10 MYOCARDIAL PERFUSION IMAGING   Z98.61 PCV ECHOCARDIOGRAM COMPLETE    CT CHEST LUNG CANCER SCREENING LOW DOSE WO CONTRAST    4. SOB (shortness of breath)  R06.02 MYOCARDIAL PERFUSION IMAGING    PCV ECHOCARDIOGRAM COMPLETE    CT CHEST LUNG CANCER SCREENING LOW DOSE WO CONTRAST   will do echo and stress  test       Problem List Items Addressed This Visit       Cardiovascular and Mediastinum   CAD S/P percutaneous coronary angioplasty (Chronic)   Relevant Orders   MYOCARDIAL PERFUSION IMAGING   PCV ECHOCARDIOGRAM COMPLETE   CT CHEST LUNG CANCER SCREENING LOW DOSE WO CONTRAST  Essential hypertension   Relevant Orders   MYOCARDIAL PERFUSION IMAGING   PCV ECHOCARDIOGRAM COMPLETE   CT CHEST LUNG CANCER SCREENING LOW DOSE WO CONTRAST     Respiratory   Pulmonary nodule - Primary   Relevant Orders   MYOCARDIAL PERFUSION IMAGING   PCV ECHOCARDIOGRAM COMPLETE   CT CHEST LUNG CANCER SCREENING LOW DOSE WO CONTRAST   Other Visit Diagnoses     SOB (shortness of breath)       will do echo and stress  test   Relevant Orders   MYOCARDIAL PERFUSION IMAGING   PCV ECHOCARDIOGRAM COMPLETE   CT CHEST LUNG CANCER SCREENING LOW DOSE WO CONTRAST          Disposition:   Return in about 4 weeks (around 11/07/2022) for echo, stress test, lung screening ct.    Total time spent: 45 minutes  Signed,  Neoma Laming,  MD  10/10/2022 1:06 [place]

## 2022-10-18 NOTE — Progress Notes (Unsigned)
Subjective:    Keith Gibbs - 62 y.o. male MRN [id number]  Date of birth: 07/28/1960  HPI  Keith Gibbs is to establish care.   Current issues and/or concerns: Behavioral Health  Neurosurgery - s/p lumbar fusion Cards - CAD, history of coronary angioplasty, HTN (last appt [facility] 10/10/2022) Pulmo - COPD, pulmonary nodule, shortness of breath   ROS per HPI     Health Maintenance:  Health Maintenance Due  Topic Date Due   COVID-19 Vaccine (1) Never done     Past Medical History: Patient Active Problem List   Diagnosis Date Noted   S/P lumbar fusion 08/23/2022   Postlaminectomy syndrome, not elsewhere classified 08/12/2022   MDD (major depressive disorder) 08/10/2022   Substance induced mood disorder (Keith Gibbs) 08/06/2022   Suicide attempt by drug overdose (Keith Gibbs) 08/06/2022   Toxic encephalopathy 08/03/2022   Suicidal ideation 08/03/2022   Encephalopathy 08/03/2022   Polysubstance abuse (Keith Gibbs) 04/28/2022   Postoperative abdominal pain 02/28/2022   Acute cholecystitis 02/17/2022   Obstruction of nasal valve 01/07/2022   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   Pulmonary nodule 12/17/2021   CAD S/P percutaneous coronary angioplasty 12/17/2021   Epigastric abdominal pain    Nausea and vomiting    Tobacco dependence due to cigarettes 08/31/2021   Iron deficiency anemia secondary to inadequate dietary iron intake 08/31/2021   Acute lead-induced gout involving toe of left foot 08/31/2021   Encounter for lipid screening for cardiovascular disease 08/31/2021   Hypotension due to drugs 06/21/2021   Opioid withdrawal (Keith Gibbs) 06/21/2021   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Keith Gibbs) 06/21/2021   RLS (restless legs syndrome) 05/07/2021   Acquired deviated nasal septum 05/03/2021   Encounter for surveillance of abnormal nevi 05/03/2021   Flu vaccine need 05/03/2021   Substance abuse (Keith Gibbs) 05/03/2021   Drug abuse and dependence (Keith Gibbs) 05/03/2021   Acute midline low back pain without sciatica  05/03/2021   Need for shingles vaccine 05/03/2021   Right groin pain 01/29/2021   S/P TKR (total knee replacement) using cement, right 01/19/2021   Cigarette smoker 07/21/2020   Foot pain, bilateral 12/25/2019   Potential exposure to STD 12/12/2019   History of lumbar surgery 12/12/2019   Fatigue 12/12/2019   Penile lesion 12/12/2019   Right testicular pain 12/12/2019   Body mass index 28.0-28.9, adult 12/12/2019   History of COPD 12/12/2019   Tobacco use disorder, continuous 12/12/2019   Hepatoma ([place]) 11/05/2019   Anxiety 12/10/2018   Essential hypertension 09/18/2018   Hypercholesteremia 09/18/2018   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   Chronic anticoagulation (Plavix) 04/19/2018   Pain medication agreement broken (Keith Gibbs) 04/19/2018   Cocaine use 04/18/2018   Cocaine abuse ([place]) (See 04/12/18 UDS) 04/18/2018   Marijuana abuse (See 04/12/18 UDS) 04/18/2018   Long term current use of opiate analgesic 04/12/2018   Cirrhosis of liver (Keith Gibbs) 04/03/2018   BPH (benign prostatic hyperplasia) 04/03/2018   Hematuria, microscopic 04/03/2018   Osteoarthritis of the knee (Left) 11/02/2017   Lumbar facet syndrome (Bilateral) 11/01/2017   Spondylosis without myelopathy or radiculopathy, lumbosacral region 11/01/2017   Osteoarthritis of shoulder (Right) 11/01/2017   Osteoarthritis of acromioclavicular joint (Right) 11/01/2017   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) 11/01/2017   Osteoarthritis 11/01/2017   Cervicalgia 11/01/2017   Cervical facet syndrome 11/01/2017   DDD (degenerative disc disease), cervical 11/01/2017   DDD (degenerative disc disease), lumbar 11/01/2017   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) 09/18/2017   Other chronic pain 09/18/2017   Chronic musculoskeletal pain 09/18/2017  Chronic pain of right knee 09/11/2017   Chronic low back pain (Secondary Area of Pain) (Bilateral) 09/11/2017   Chronic hip pain [facility] Area of Pain) (Bilateral) (R>L)  09/11/2017   Chronic shoulder pain (Fourth Area of Pain) (Right) 09/11/2017   Spondylosis without myelopathy or radiculopathy, cervical region 09/11/2017   Chronic pain syndrome 09/11/2017   Opiate use 09/11/2017   Screen for STD (sexually transmitted disease) 09/11/2017   Disorder of skeletal system 09/11/2017   Problems influencing health status 09/11/2017   Overdose of medication, intentional self-harm, sequela (Keith Gibbs) 12/28/2016   Severe recurrent major depression without psychotic features (Keith Gibbs) 12/27/2016   Alcohol use disorder, moderate, dependence (Keith Gibbs) 12/27/2016   Cannabis use disorder, moderate, dependence ([place]) 12/27/2016   Chest pain 06/10/2016   Bipolar 1 disorder ([place]) 05/27/2013   Abnormal ejaculation 11/30/2012   Chest pain, unspecified 11/30/2012   Degenerative arthritis of hip 11/16/2012   Schizoaffective disorder (Keith Gibbs) 06/27/2012   COPD (chronic obstructive pulmonary disease) (Keith Gibbs) 06/27/2012   Chronic hepatitis C (Keith Gibbs) 06/27/2012   GERD (gastroesophageal reflux disease) 06/27/2012   Complete rupture of rotator cuff 02/10/2012   Cocaine abuse in remission ([place]) 12/05/2011   Unspecified osteoarthritis, unspecified site 07/27/2011      Social History   reports that he has been smoking cigarettes. He has a 16.00 pack-year smoking history. He has never used smokeless tobacco. He reports current alcohol use of about 6.0 standard drinks of alcohol per week. He reports that he does not currently use drugs after having used the following drugs: Cocaine and Marijuana.   Family History  family history includes Depression in his mother; Early death in his father; Heart attack in his father; Heart disease in his father and mother; Hypertension in his father, mother, and sister; Osteoarthritis in his mother.   Medications: reviewed and updated   Objective:   Physical Exam There were no vitals taken for this visit. Physical Exam      Assessment & Plan:          Patient was given clear instructions to go to Emergency Department or return to medical center if symptoms don't improve, worsen, or new problems develop.The patient verbalized understanding.  I discussed the assessment and treatment plan with the patient. The patient was provided an opportunity to ask questions and all were answered. The patient agreed with the plan and demonstrated an understanding of the instructions.   The patient was advised to call back or seek an in-person evaluation if the symptoms worsen or if the condition fails to improve as anticipated.    Durene Fruits, NP 10/18/2022, 11:20 AM Primary Care at [facility]

## 2022-10-19 ENCOUNTER — Ambulatory Visit (INDEPENDENT_AMBULATORY_CARE_PROVIDER_SITE_OTHER): Payer: Medicaid Other | Admitting: Family

## 2022-10-19 ENCOUNTER — Encounter: Payer: Self-pay | Admitting: Family

## 2022-10-19 VITALS — BP 128/65 | HR 60 | Temp 98.3°F | Resp 16 | Ht 75.0 in | Wt 205.0 lb

## 2022-10-19 DIAGNOSIS — N50811 Right testicular pain: Secondary | ICD-10-CM

## 2022-10-19 DIAGNOSIS — R251 Tremor, unspecified: Secondary | ICD-10-CM | POA: Diagnosis not present

## 2022-10-19 DIAGNOSIS — Z87438 Personal history of other diseases of male genital organs: Secondary | ICD-10-CM

## 2022-10-19 DIAGNOSIS — R0602 Shortness of breath: Secondary | ICD-10-CM

## 2022-10-19 DIAGNOSIS — J342 Deviated nasal septum: Secondary | ICD-10-CM | POA: Diagnosis not present

## 2022-10-19 DIAGNOSIS — R911 Solitary pulmonary nodule: Secondary | ICD-10-CM | POA: Diagnosis not present

## 2022-10-19 DIAGNOSIS — Z7689 Persons encountering health services in other specified circumstances: Secondary | ICD-10-CM

## 2022-10-19 DIAGNOSIS — J449 Chronic obstructive pulmonary disease, unspecified: Secondary | ICD-10-CM

## 2022-10-19 NOTE — Progress Notes (Signed)
Pt presents to establish care,  -request provider to take a look at incision from surgery that was completed 2 months  -swollen groin and testicle on the right going on for couple of weeks  -request referral to ENT and neurologist

## 2022-10-28 ENCOUNTER — Telehealth: Payer: Self-pay

## 2022-10-28 NOTE — Telephone Encounter (Signed)
Copied from CRM [id number]. Topic: General - Other >> Oct 27, 2022  4:33 PM Carrielelia G wrote: Reason for CRM: [facility], orders were sent :US Scrotum they need to know if this is  ( with or without dopplers)   Please advise.

## 2022-10-28 NOTE — Telephone Encounter (Signed)
Spoke to [place] advised that provider wanted w/doppler, Eliseo Gum states she will update the order and send for cosign.

## 2022-10-28 NOTE — Telephone Encounter (Signed)
With doppler 

## 2022-10-31 ENCOUNTER — Encounter (HOSPITAL_COMMUNITY): Payer: Self-pay

## 2022-10-31 ENCOUNTER — Encounter (HOSPITAL_COMMUNITY): Payer: Self-pay | Admitting: Student in an Organized Health Care Education/Training Program

## 2022-10-31 ENCOUNTER — Ambulatory Visit (INDEPENDENT_AMBULATORY_CARE_PROVIDER_SITE_OTHER): Payer: Medicaid Other | Admitting: Student in an Organized Health Care Education/Training Program

## 2022-10-31 VITALS — BP 117/63 | HR 58 | Wt 195.0 lb

## 2022-10-31 DIAGNOSIS — F250 Schizoaffective disorder, bipolar type: Secondary | ICD-10-CM | POA: Diagnosis not present

## 2022-10-31 DIAGNOSIS — F3170 Bipolar disorder, currently in remission, most recent episode unspecified: Secondary | ICD-10-CM | POA: Diagnosis not present

## 2022-10-31 MED ORDER — DIVALPROEX SODIUM 500 MG PO DR TAB: 1000.0000 mg | DELAYED_RELEASE_TABLET | Freq: Every day | ORAL | 2 refills | Status: DC

## 2022-10-31 MED ORDER — GABAPENTIN 600 MG PO TABS: 600.0000 mg | ORAL_TABLET | Freq: Two times a day (BID) | ORAL | 2 refills | Status: DC

## 2022-10-31 MED ORDER — DULOXETINE HCL 30 MG PO CPEP
90.0000 mg | ORAL_CAPSULE | Freq: Every day | ORAL | 3 refills | Status: DC
Start: 2022-10-31 — End: 2023-01-06

## 2022-10-31 MED ORDER — HYDROXYZINE HCL 25 MG PO TABS: 25.0000 mg | ORAL_TABLET | Freq: Three times a day (TID) | ORAL | 3 refills | Status: DC | PRN

## 2022-10-31 MED ORDER — DIVALPROEX SODIUM 500 MG PO DR TAB: 500.0000 mg | DELAYED_RELEASE_TABLET | Freq: Every day | ORAL | 3 refills | Status: DC
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
## 2022-11-01 ENCOUNTER — Ambulatory Visit (INDEPENDENT_AMBULATORY_CARE_PROVIDER_SITE_OTHER): Payer: Self-pay

## 2022-11-01 DIAGNOSIS — I2510 Atherosclerotic heart disease of native coronary artery without angina pectoris: Secondary | ICD-10-CM

## 2022-11-01 DIAGNOSIS — I10 Essential (primary) hypertension: Secondary | ICD-10-CM

## 2022-11-01 DIAGNOSIS — R0602 Shortness of breath: Secondary | ICD-10-CM

## 2022-11-01 DIAGNOSIS — R911 Solitary pulmonary nodule: Secondary | ICD-10-CM

## 2022-11-01 DIAGNOSIS — Z9861 Coronary angioplasty status: Secondary | ICD-10-CM | POA: Diagnosis not present

## 2022-11-01 MED ORDER — TECHNETIUM TC 99M SESTAMIBI GENERIC - CARDIOLITE
9.8000 | Freq: Once | INTRAVENOUS | Status: AC | PRN
  Administered 2022-11-01: 9.8 via INTRAVENOUS

## 2022-11-01 MED ORDER — TECHNETIUM TC 99M SESTAMIBI GENERIC - CARDIOLITE
31.8000 | Freq: Once | INTRAVENOUS | Status: AC | PRN
Start: 2022-11-01 — End: 2022-11-01
  Administered 2022-11-01: 31.8 via INTRAVENOUS

## 2022-11-02 ENCOUNTER — Other Ambulatory Visit (HOSPITAL_COMMUNITY): Payer: Medicaid Other

## 2022-11-07 ENCOUNTER — Ambulatory Visit (INDEPENDENT_AMBULATORY_CARE_PROVIDER_SITE_OTHER): Payer: Self-pay

## 2022-11-07 ENCOUNTER — Other Ambulatory Visit (HOSPITAL_COMMUNITY): Payer: Medicaid Other

## 2022-11-07 DIAGNOSIS — I351 Nonrheumatic aortic (valve) insufficiency: Secondary | ICD-10-CM

## 2022-11-07 DIAGNOSIS — R0602 Shortness of breath: Secondary | ICD-10-CM

## 2022-11-07 DIAGNOSIS — I361 Nonrheumatic tricuspid (valve) insufficiency: Secondary | ICD-10-CM

## 2022-11-07 DIAGNOSIS — I340 Nonrheumatic mitral (valve) insufficiency: Secondary | ICD-10-CM

## 2022-11-07 DIAGNOSIS — I10 Essential (primary) hypertension: Secondary | ICD-10-CM

## 2022-11-07 DIAGNOSIS — R911 Solitary pulmonary nodule: Secondary | ICD-10-CM

## 2022-11-07 DIAGNOSIS — I2510 Atherosclerotic heart disease of native coronary artery without angina pectoris: Secondary | ICD-10-CM

## 2022-11-09 ENCOUNTER — Other Ambulatory Visit (HOSPITAL_COMMUNITY): Payer: Self-pay

## 2022-11-09 ENCOUNTER — Other Ambulatory Visit: Payer: Self-pay | Admitting: Internal Medicine

## 2022-11-09 DIAGNOSIS — F3170 Bipolar disorder, currently in remission, most recent episode unspecified: Secondary | ICD-10-CM

## 2022-11-09 DIAGNOSIS — F250 Schizoaffective disorder, bipolar type: Secondary | ICD-10-CM

## 2022-11-09 DIAGNOSIS — E782 Mixed hyperlipidemia: Secondary | ICD-10-CM

## 2022-11-09 NOTE — Progress Notes (Signed)
Patient in today for due ordered Valproic Acid Level and Comprehensive Metabolic Panel.  Patient presented with appropriate affect, level mood but admitted he was somewhat anxious today as he is waiting on test results to confirm if he has potential lung cancer.  Patient stated he would know this in the coming weeks and agreed to keep our providers informed and if any issues with his mood or anxiety during this difficult time too.  Blood drawn from patient's right hand and no ill affects as patient denied pain or discomfort after initial stick.  Labs prepared for pick up by [facility] and left with nursing staff to place in box for pick up this date. Patient to call if any concerns and will be contacted if any issues with lab results from today.

## 2022-11-10 LAB — VALPROIC ACID LEVEL: Valproic Acid Lvl: <4 ug/mL — ABNORMAL LOW (ref 50–100)

## 2022-11-11 ENCOUNTER — Ambulatory Visit
Admission: RE | Admit: 2022-11-11 | Discharge: 2022-11-11 | Disposition: A | Discharge status: Home / Self Care | Payer: Medicaid Other | Source: Ambulatory Visit | Attending: Family | Admitting: Family

## 2022-11-11 DIAGNOSIS — N50811 Right testicular pain: Secondary | ICD-10-CM

## 2022-11-14 ENCOUNTER — Ambulatory Visit (INDEPENDENT_AMBULATORY_CARE_PROVIDER_SITE_OTHER): Payer: Self-pay

## 2022-11-14 DIAGNOSIS — R911 Solitary pulmonary nodule: Secondary | ICD-10-CM

## 2022-11-14 DIAGNOSIS — F17219 Nicotine dependence, cigarettes, with unspecified nicotine-induced disorders: Secondary | ICD-10-CM

## 2022-11-14 DIAGNOSIS — I10 Essential (primary) hypertension: Secondary | ICD-10-CM

## 2022-11-14 DIAGNOSIS — R0602 Shortness of breath: Secondary | ICD-10-CM

## 2022-11-14 DIAGNOSIS — I2510 Atherosclerotic heart disease of native coronary artery without angina pectoris: Secondary | ICD-10-CM

## 2022-11-17 ENCOUNTER — Telehealth: Payer: Self-pay

## 2022-11-17 NOTE — Telephone Encounter (Signed)
Pt was called and is aware of results, DOB was confirmed.  ?

## 2022-11-17 NOTE — Telephone Encounter (Signed)
-----   Message from Guy Franco, RN sent at 11/15/2022 12:07 PM EDT -----  ----- Message ----- From: Rema Fendt, NP Sent: 11/14/2022   8:45 AM EDT To: Marcelle Overlie  No acute process. Continue with plan discussed in office.

## 2022-11-21 ENCOUNTER — Encounter: Payer: Self-pay | Admitting: Cardiovascular Disease

## 2022-11-21 ENCOUNTER — Ambulatory Visit (INDEPENDENT_AMBULATORY_CARE_PROVIDER_SITE_OTHER): Payer: Self-pay | Admitting: Cardiovascular Disease

## 2022-11-21 ENCOUNTER — Ambulatory Visit: Payer: Self-pay | Admitting: Cardiovascular Disease

## 2022-11-21 VITALS — BP 114/74 | HR 47 | Ht 75.0 in | Wt 199.6 lb

## 2022-11-21 DIAGNOSIS — R911 Solitary pulmonary nodule: Secondary | ICD-10-CM | POA: Diagnosis not present

## 2022-11-21 DIAGNOSIS — F17210 Nicotine dependence, cigarettes, uncomplicated: Secondary | ICD-10-CM

## 2022-11-21 DIAGNOSIS — Z9861 Coronary angioplasty status: Secondary | ICD-10-CM

## 2022-11-21 DIAGNOSIS — I2510 Atherosclerotic heart disease of native coronary artery without angina pectoris: Secondary | ICD-10-CM | POA: Diagnosis not present

## 2022-11-21 DIAGNOSIS — I10 Essential (primary) hypertension: Secondary | ICD-10-CM | POA: Diagnosis not present

## 2022-11-21 MED ORDER — SODIUM CHLORIDE 0.9% FLUSH
3.0000 mL | Freq: Two times a day (BID) | INTRAVENOUS | Status: DC
Start: 2022-11-21 — End: 2022-12-02

## 2022-11-21 NOTE — Progress Notes (Signed)
Cardiology Office Note   Date:  11/21/2022   ID:  Keith Gibbs, DOB May 22, 1961, MRN [id number]  PCP:  Claiborne Rigg, NP  Cardiologist:  Adrian Blackwater, MD      History of Present Illness: Keith Gibbs is a 62 y.o. male who presents for  Chief Complaint  Patient presents with   Follow-up    NST, Echo, CT results.    Patient in office for results of stress test, echo, CT scan of chest. Patient  complains of chest pain, shortness of breath, dizziness.     Past Medical History:  Diagnosis Date   Anginal pain (HCC)    Anxiety    Arthritis    Bipolar 1 disorder (HCC)    Bursitis    CAD (coronary artery disease)    Chronic pain    COPD (chronic obstructive pulmonary disease) (HCC)    Current use of long term anticoagulation    DAPT (ASA + clopidogrel)   Depression    Diverticulitis    Dyspnea    GERD (gastroesophageal reflux disease)    Grade I diastolic dysfunction    Hepatitis C 2012   No longer has Hep C   HLD (hyperlipidemia)    Hypertension    MI (myocardial infarction) (HCC)    Polysubstance abuse (HCC)    cocaine, marijuana, ETOH   PUD (peptic ulcer disease)    S/P angioplasty with stent 06/10/2016   a.) 90% stenosis of pLAD to mLAD - 2.5 x 18 mm Xience Alpine (DES x 1) placed to pLAD   S/P PTCA (percutaneous transluminal coronary angioplasty) 12/04/2019   a.) 60% in stent restenosis of DES to pLAD; LVEF 65%.   Schizophrenia (HCC)    Stroke [facility])    Valvular insufficiency    a.) Mild MR, TR, PR; mild to moderate AR on 03/05/2018 TTE     Past Surgical History:  Procedure Laterality Date   ABDOMINAL SURGERY     removed small piece of intestines due to [facility] Diverticulosis   ANTERIOR LAT LUMBAR FUSION N/A 08/23/2022   Procedure: DIRECT LATERAL INTERBODY FUSION  LUMBAR TWO- LUMBAR THREE, LUMBAR THREE-LUMBAR FOUR , EXPLORE AND EXTEND FUSION LUMBAR TWO-LUMBAR FIVE, POSTERIOR DECOMPRESSION LUMBAR THREE-LUMBAR FOUR, LEFT LUMBAR TWO-LUMBAR THREE;   Surgeon: Bedelia Person, MD;  [facility];  Service: Neurosurgery;  Laterality: N/A;   APPENDECTOMY     BACK SURGERY     CARDIAC CATHETERIZATION Left 06/10/2016   Procedure: Left Heart Cath and Coronary Angiography;  Surgeon: Laurier Nancy, MD;  Location: [facility] INVASIVE CV LAB;  Service: Cardiovascular;  Laterality: Left;   CARDIAC CATHETERIZATION N/A 06/10/2016   Procedure: Coronary Stent Intervention;  Surgeon: Alwyn Pea, MD;  Location: [facility] INVASIVE CV LAB;  Service: Cardiovascular;  Laterality: N/A;   CHOLECYSTECTOMY N/A 02/17/2022   Procedure: LAPAROSCOPIC CHOLECYSTECTOMY;  Surgeon: Sheliah Hatch De Blanch, MD;  Location: [facility] OR;  Service: General;  Laterality: N/A;   COLON SURGERY     COLONOSCOPY     COLONOSCOPY WITH PROPOFOL N/A 01/05/2017   Procedure: COLONOSCOPY WITH PROPOFOL;  Surgeon: Wyline Mood, MD;  Location: [facility] ENDOSCOPY;  Service: Endoscopy;  Laterality: N/A;   COLONOSCOPY WITH PROPOFOL N/A 02/13/2020   Procedure: COLONOSCOPY WITH PROPOFOL;  Surgeon: Pasty Spillers, MD;  Location: [facility] ENDOSCOPY;  Service: Endoscopy;  Laterality: N/A;   CORONARY ANGIOPLASTY WITH STENT PLACEMENT     CORONARY PRESSURE/FFR STUDY N/A 12/04/2019   Procedure: INTRAVASCULAR PRESSURE WIRE/FFR STUDY;  Surgeon: Tonny Bollman,  MD;  [facility] INVASIVE CV LAB;  Service: Cardiovascular;  Laterality: N/A;   ESOPHAGOGASTRODUODENOSCOPY (EGD) WITH PROPOFOL N/A 01/05/2017   Procedure: ESOPHAGOGASTRODUODENOSCOPY (EGD) WITH PROPOFOL;  Surgeon: Wyline Mood, MD;  Location[facility] ENDOSCOPY;  Service: Endoscopy;  Laterality: N/A;   ESOPHAGOGASTRODUODENOSCOPY (EGD) WITH PROPOFOL N/A 02/13/2020   Procedure: ESOPHAGOGASTRODUODENOSCOPY (EGD) WITH PROPOFOL;  Surgeon: Pasty Spillers, MD;  Location: [facility] ENDOSCOPY;  Service: Endoscopy;  Laterality: N/A;   KNEE ARTHROSCOPY WITH MEDIAL MENISECTOMY Right 09/05/2017   Procedure: KNEE ARTHROSCOPY WITH MEDIAL AND LATERAL  MENISECTOMY PARTIAL  SYNOVECTOMY;  Surgeon: Kennedy Bucker, MD;  Location: [facility] ORS;  Service: Orthopedics;  Laterality: Right;   LEFT HEART CATH AND CORONARY ANGIOGRAPHY N/A 12/04/2019   Procedure: LEFT HEART CATH AND CORONARY ANGIOGRAPHY;  Surgeon: Tonny Bollman, MD;  Location: [facility] INVASIVE CV LAB;  Service: Cardiovascular;  Laterality: N/A;   SHOULDER SURGERY Right 04/09/2012   SPINE SURGERY     TOTAL KNEE ARTHROPLASTY Right 01/19/2021   Procedure: TOTAL KNEE ARTHROPLASTY - [place] Neighbor to Assist;  Surgeon: Kennedy Bucker, MD;  Location: [facility] ORS;  Service: Orthopedics;  Laterality: Right;     Current Outpatient Medications  Medication Sig Dispense Refill   albuterol (VENTOLIN HFA) 108 (90 Base) MCG/ACT inhaler Inhale 2 puffs into the lungs every 6 (six) hours as needed for wheezing or shortness of breath. 18 g 6   aspirin EC 81 MG tablet Take 1 tablet (81 mg total) by mouth daily. Swallow whole. 30 tablet 12   atorvastatin (LIPITOR) 80 MG tablet TAKE 1 TABLET BY MOUTH EVERY DAY 90 tablet 3   Budeson-Glycopyrrol-Formoterol (BREZTRI AEROSPHERE) 160-9-4.8 MCG/ACT AERO Inhale 2 puffs into the lungs in the morning and at bedtime.     cyclobenzaprine (FLEXERIL) 10 MG tablet Take 1 tablet (10 mg total) by mouth 3 (three) times daily as needed for muscle spasms. 50 tablet 0   diclofenac Sodium (VOLTAREN) 1 % GEL Apply 2 g topically 4 (four) times daily. 2 g 0   divalproex (DEPAKOTE) 500 MG DR tablet Take 1 tablet (500 mg total) by mouth daily. 30 tablet 3   divalproex (DEPAKOTE) 500 MG DR tablet Take 2 tablets (1,000 mg total) by mouth at bedtime. 60 tablet 2   DULoxetine (CYMBALTA) 30 MG capsule Take 3 capsules (90 mg total) by mouth daily. 90 capsule 3   gabapentin (NEURONTIN) 600 MG tablet Take 1 tablet (600 mg total) by mouth 2 (two) times daily. 60 tablet 2   hydrOXYzine (ATARAX) 25 MG tablet Take 1 tablet (25 mg total) by mouth 3 (three) times daily as needed for anxiety. 90 tablet 3   traZODone (DESYREL) 100 MG  tablet Take 1 tablet (100 mg total) by mouth at bedtime as needed for sleep. 30 tablet 3   No current facility-administered medications for this visit.   Facility-Administered Medications Ordered in Other Visits  Medication Dose Route Frequency Provider Last Rate Last Admin   sodium chloride flush (NS) 0.9 % injection 3 mL  3 mL Intravenous Q12H Laurier Nancy, MD        Allergies:   Asenapine, Latuda [lurasidone hcl], and Lurasidone    Social History:   reports that he has been smoking cigarettes. He has a 16.00 pack-year smoking history. He has been exposed to tobacco smoke. He has never used smokeless tobacco. He reports current alcohol use of about 6.0 standard drinks of alcohol per week. He reports that he does not currently use drugs after having used the following  drugs: Cocaine and Marijuana.   Family History:  family history includes Depression in his mother; Early death in his father; Heart attack in his father; Heart disease in his father and mother; Hypertension in his father, mother, and sister; Osteoarthritis in his mother.    ROS:     Review of Systems  Constitutional: Negative.   HENT: Negative.    Eyes: Negative.   Respiratory:  Positive for shortness of breath.   Cardiovascular:  Positive for chest pain.  Gastrointestinal: Negative.   Genitourinary: Negative.   Musculoskeletal: Negative.   Skin: Negative.   Neurological:  Positive for dizziness.  Endo/Heme/Allergies: Negative.   Psychiatric/Behavioral: Negative.    All other systems reviewed and are negative.   All other systems are reviewed and negative.   PHYSICAL EXAM: VS:  BP 114/74   Pulse (!) 47   Ht 6\' 3"  (1.905 m)   Wt 199 lb 9.6 oz (90.5 kg)   SpO2 97%   BMI 24.95 kg/m  , BMI Body mass index is 24.95 kg/m. Last weight:  Wt Readings from Last 3 Encounters:  11/21/22 199 lb 9.6 oz (90.5 kg)  10/31/22 195 lb (88.5 kg)  10/19/22 205 lb (93 kg)   Physical Exam Vitals reviewed.   Constitutional:      Appearance: Normal appearance. He is normal weight.  HENT:     Head: Normocephalic.     Nose: Nose normal.     Mouth/Throat:     Mouth: Mucous membranes are moist.  Eyes:     Pupils: Pupils are equal, round, and reactive to light.  Cardiovascular:     Rate and Rhythm: Normal rate and regular rhythm.     Pulses: Normal pulses.     Heart sounds: Normal heart sounds.  Pulmonary:     Effort: Pulmonary effort is normal.  Abdominal:     General: Abdomen is flat. Bowel sounds are normal.  Musculoskeletal:        General: Normal range of motion.     Cervical back: Normal range of motion.  Skin:    General: Skin is warm.  Neurological:     General: No focal deficit present.     Mental Status: He is alert.  Psychiatric:        Mood and Affect: Mood normal.     EKG: none today  Recent Labs: 04/26/2022: Magnesium 1.9 08/03/2022: TSH 0.420 08/05/2022: ALT 18 08/24/2022: BUN 13; Creatinine, Ser 0.96; Hemoglobin 10.9; Platelets 257; Potassium 3.9; Sodium 134    Lipid Panel    Component Value Date/Time   CHOL 83 04/26/2022 1530   CHOL 92 (L) 08/31/2021 1408   TRIG 52 04/26/2022 1530   HDL 28 (L) 04/26/2022 1530   HDL 33 (L) 08/31/2021 1408   CHOLHDL 3.0 04/26/2022 1530   VLDL 10 04/26/2022 1530   LDLCALC 45 04/26/2022 1530   LDLCALC 41 08/31/2021 1408     Other studies Reviewed: stress test, echo, CT lung scan   ASSESSMENT AND PLAN:    ICD-10-CM   1. CAD S/P percutaneous coronary angioplasty  I25.10 Basic metabolic panel   Z61.09 CBC With Diff/Platelet    INR/PT    2. Essential hypertension  I10 Basic metabolic panel    CBC With Diff/Platelet    INR/PT    3. Pulmonary nodule  R91.1 Basic metabolic panel    CBC With Diff/Platelet    INR/PT    4. Cigarette smoker  F17.210 Basic metabolic panel    CBC With Diff/Platelet  INR/PT       Problem List Items Addressed This Visit       Cardiovascular and Mediastinum   CAD S/P percutaneous  coronary angioplasty - Primary (Chronic)    Patient complaining of chest pressure, shortness of breath, dizziness. Stress test 10/2022 Small mild reversible basal inferoseptal and inferior, mid inferoseptal, apical inferior and apex (17) wall defects, normal wall motion. Normal EF on echo. CCTA 01/2022 RCA has minor irregularities. LCX has minor luminor irregularities. LAD is severely calcified mid portion with distal high grad lesion. Consider Cath if having Chest Pain. Patient having chest pain, will schedule for left heart cath.        Relevant Orders   Basic metabolic panel   CBC With Diff/Platelet   INR/PT   Essential hypertension   Relevant Orders   Basic metabolic panel   CBC With Diff/Platelet   INR/PT     Respiratory   Pulmonary nodule    Low dose lung cancer screening revealed no suspicious nodule or evidence of lung neoplasm.       Relevant Orders   Basic metabolic panel   CBC With Diff/Platelet   INR/PT     Other   Cigarette smoker    Smoking cessation instruction/counseling given:  counseled patient on the dangers of tobacco use, advised patient to stop smoking, and reviewed strategies to maximize success       Relevant Orders   Basic metabolic panel   CBC With Diff/Platelet   INR/PT     Disposition:   Return in about 1 week (around 11/28/2022) for after heart cath.    Total time spent: 30 minutes  Signed,  Adrian Blackwater, MD  11/21/2022 11:00 AM    [facility]

## 2022-11-21 NOTE — Assessment & Plan Note (Signed)
Low dose lung cancer screening revealed no suspicious nodule or evidence of lung neoplasm.

## 2022-11-21 NOTE — Assessment & Plan Note (Signed)
Smoking cessation instruction/counseling given:  counseled patient on the dangers of tobacco use, advised patient to stop smoking, and reviewed strategies to maximize success 

## 2022-11-21 NOTE — Assessment & Plan Note (Signed)
Patient complaining of chest pressure, shortness of breath, dizziness. Stress test 10/2022 Small mild reversible basal inferoseptal and inferior, mid inferoseptal, apical inferior and apex (17) wall defects, normal wall motion. Normal EF on echo. CCTA 01/2022 RCA has minor irregularities. LCX has minor luminor irregularities. LAD is severely calcified mid portion with distal high grad lesion. Consider Cath if having Chest Pain. Patient having chest pain, will schedule for left heart cath.

## 2022-11-22 LAB — CBC WITH DIFF/PLATELET
Basophils Absolute: 0.1 10*3/uL (ref 0.0–0.2)
Basos: 1 %
EOS (ABSOLUTE): 0.4 10*3/uL (ref 0.0–0.4)
Eos: 5 %
Hematocrit: 49.9 % (ref 37.5–51.0)
Hemoglobin: 16.3 g/dL (ref 13.0–17.7)
Immature Grans (Abs): 0 10*3/uL (ref 0.0–0.1)
Immature Granulocytes: 0 %
Lymphocytes Absolute: 3 10*3/uL (ref 0.7–3.1)
Lymphs: 36 %
MCH: 30.6 pg (ref 26.6–33.0)
MCHC: 32.7 g/dL (ref 31.5–35.7)
MCV: 94 fL (ref 79–97)
Monocytes Absolute: 0.5 10*3/uL (ref 0.1–0.9)
Monocytes: 6 %
Neutrophils Absolute: 4.4 10*3/uL (ref 1.4–7.0)
Neutrophils: 52 %
Platelets: 226 10*3/uL (ref 150–450)
RBC: 5.32 x10E6/uL (ref 4.14–5.80)
RDW: 13.4 % (ref 11.6–15.4)
WBC: 8.4 10*3/uL (ref 3.4–10.8)

## 2022-11-22 LAB — BASIC METABOLIC PANEL
BUN/Creatinine Ratio: 17 (ref 10–24)
BUN: 15 mg/dL (ref 8–27)
CO2: 22 mmol/L (ref 20–29)
Calcium: 9.4 mg/dL (ref 8.6–10.2)
Chloride: 100 mmol/L (ref 96–106)
Creatinine, Ser: 0.87 mg/dL (ref 0.76–1.27)
Glucose: 86 mg/dL (ref 70–99)
Potassium: 5.2 mmol/L (ref 3.5–5.2)
Sodium: 135 mmol/L (ref 134–144)
eGFR: 98 mL/min/{1.73_m2} (ref >59)

## 2022-11-22 LAB — PROTIME-INR
INR: 1 (ref 0.9–1.2)
Prothrombin Time: 11.3 s (ref 9.1–12.0)

## 2022-11-25 ENCOUNTER — Ambulatory Visit
Admission: RE | Admit: 2022-11-25 | Discharge: 2022-11-25 | Disposition: A | Discharge status: Home / Self Care | Payer: Medicaid Other | Attending: Cardiovascular Disease | Admitting: Cardiovascular Disease

## 2022-11-25 ENCOUNTER — Encounter: Payer: Self-pay | Admitting: Cardiovascular Disease

## 2022-11-25 ENCOUNTER — Encounter
Admission: RE | Disposition: A | Discharge status: Home / Self Care | Payer: Self-pay | Source: Home / Self Care | Attending: Cardiovascular Disease

## 2022-11-25 ENCOUNTER — Telehealth: Payer: Self-pay | Admitting: Cardiovascular Disease

## 2022-11-25 DIAGNOSIS — Z955 Presence of coronary angioplasty implant and graft: Secondary | ICD-10-CM | POA: Insufficient documentation

## 2022-11-25 DIAGNOSIS — I2510 Atherosclerotic heart disease of native coronary artery without angina pectoris: Secondary | ICD-10-CM | POA: Insufficient documentation

## 2022-11-25 DIAGNOSIS — R079 Chest pain, unspecified: Secondary | ICD-10-CM | POA: Diagnosis present

## 2022-11-25 DIAGNOSIS — I200 Unstable angina: Secondary | ICD-10-CM

## 2022-11-25 HISTORY — PX: LEFT HEART CATH AND CORONARY ANGIOGRAPHY: CATH118249

## 2022-11-25 SURGERY — LEFT HEART CATH AND CORONARY ANGIOGRAPHY
Anesthesia: Moderate Sedation | Laterality: Left

## 2022-11-25 MED ORDER — MORPHINE SULFATE (PF) 2 MG/ML IV SOLN
INTRAVENOUS | Status: AC
  Filled 2022-11-25: qty 1

## 2022-11-25 MED ORDER — ASPIRIN 81 MG PO CHEW
81.0000 mg | CHEWABLE_TABLET | ORAL | Status: AC
  Administered 2022-11-25: 81 mg via ORAL

## 2022-11-25 MED ORDER — MIDAZOLAM HCL 2 MG/2ML IJ SOLN
INTRAMUSCULAR | Status: DC | PRN
  Administered 2022-11-25: 2 mg via INTRAVENOUS

## 2022-11-25 MED ORDER — SODIUM CHLORIDE 0.9 % WEIGHT BASED INFUSION: 1.0000 mL/kg/h | INTRAVENOUS | Status: DC

## 2022-11-25 MED ORDER — SODIUM CHLORIDE 0.9 % IV SOLN: 250.0000 mL | INTRAVENOUS | Status: DC | PRN

## 2022-11-25 MED ORDER — IOHEXOL 300 MG/ML  SOLN
INTRAMUSCULAR | Status: DC | PRN
  Administered 2022-11-25: 60 mL

## 2022-11-25 MED ORDER — HEPARIN (PORCINE) IN NACL 1000-0.9 UT/500ML-% IV SOLN
INTRAVENOUS | Status: DC | PRN
  Administered 2022-11-25 (×2): 500 mL

## 2022-11-25 MED ORDER — SODIUM CHLORIDE 0.9 % WEIGHT BASED INFUSION
3.0000 mL/kg/h | INTRAVENOUS | Status: DC
  Administered 2022-11-25: 3 mL/kg/h via INTRAVENOUS

## 2022-11-25 MED ORDER — LIDOCAINE HCL (PF) 1 % IJ SOLN
INTRAMUSCULAR | Status: DC | PRN
  Administered 2022-11-25: 20 mL

## 2022-11-25 MED ORDER — ONDANSETRON HCL 4 MG/2ML IJ SOLN: 4.0000 mg | Freq: Four times a day (QID) | INTRAMUSCULAR | Status: DC | PRN

## 2022-11-25 MED ORDER — FENTANYL CITRATE (PF) 100 MCG/2ML IJ SOLN
INTRAMUSCULAR | Status: DC | PRN
  Administered 2022-11-25: 50 ug via INTRAVENOUS

## 2022-11-25 MED ORDER — HEPARIN (PORCINE) IN NACL 1000-0.9 UT/500ML-% IV SOLN
INTRAVENOUS | Status: AC
  Filled 2022-11-25: qty 1000

## 2022-11-25 MED ORDER — MORPHINE SULFATE (PF) 4 MG/ML IV SOLN: 2.0000 mg | Freq: Once | INTRAVENOUS | Status: DC

## 2022-11-25 MED ORDER — MORPHINE SULFATE (PF) 2 MG/ML IV SOLN
2.0000 mg | Freq: Once | INTRAVENOUS | Status: AC
  Administered 2022-11-25: 2 mg via INTRAVENOUS

## 2022-11-25 MED ORDER — MIDAZOLAM HCL 2 MG/2ML IJ SOLN
INTRAMUSCULAR | Status: AC
  Filled 2022-11-25: qty 2

## 2022-11-25 MED ORDER — SODIUM CHLORIDE 0.9% FLUSH: 3.0000 mL | INTRAVENOUS | Status: DC | PRN

## 2022-11-25 MED ORDER — ASPIRIN 81 MG PO CHEW
CHEWABLE_TABLET | ORAL | Status: AC
  Filled 2022-11-25: qty 1

## 2022-11-25 MED ORDER — LABETALOL HCL 5 MG/ML IV SOLN: 10.0000 mg | INTRAVENOUS | Status: DC | PRN

## 2022-11-25 MED ORDER — FENTANYL CITRATE (PF) 100 MCG/2ML IJ SOLN
INTRAMUSCULAR | Status: AC
  Filled 2022-11-25: qty 2

## 2022-11-25 MED ORDER — HYDRALAZINE HCL 20 MG/ML IJ SOLN: 10.0000 mg | INTRAMUSCULAR | Status: DC | PRN

## 2022-11-25 MED ORDER — SODIUM CHLORIDE 0.9% FLUSH: 3.0000 mL | Freq: Two times a day (BID) | INTRAVENOUS | Status: DC

## 2022-11-25 MED ORDER — ACETAMINOPHEN 325 MG PO TABS: 650.0000 mg | ORAL_TABLET | ORAL | Status: DC | PRN

## 2022-11-25 SURGICAL SUPPLY — 12 items
CATH INFINITI 5FR MULTPACK ANG (CATHETERS) IMPLANT
DEVICE CLOSURE MYNXGRIP 5F (Vascular Products) IMPLANT
DRAPE BRACHIAL (DRAPES) IMPLANT
KIT SYRINGE INJ CVI SPIKEX1 (MISCELLANEOUS) IMPLANT
NDL PERC 18GX7CM (NEEDLE) IMPLANT
NEEDLE PERC 18GX7CM (NEEDLE) ×1 IMPLANT
PACK CARDIAC CATH (CUSTOM PROCEDURE TRAY) ×2 IMPLANT
PROTECTION STATION PRESSURIZED (MISCELLANEOUS) ×1
SET ATX-X65L (MISCELLANEOUS) IMPLANT
SHEATH AVANTI 5FR X 11CM (SHEATH) IMPLANT
STATION PROTECTION PRESSURIZED (MISCELLANEOUS) IMPLANT
WIRE EMERALD 3MM-J .035X150CM (WIRE) IMPLANT

## 2022-11-25 NOTE — Telephone Encounter (Signed)
Called patient and he said that he is having pain and pressure with a stinging sensation at the cath site. If you send any medication please send to [facility] [address]. Advised patient if patient gets unbearable to go to ED.

## 2022-11-25 NOTE — Telephone Encounter (Signed)
Patient left VM requesting pain medication be sent in. He is having a lot of pain at the site where he had a heart cath done this morning. Please advise.

## 2022-12-02 ENCOUNTER — Ambulatory Visit (INDEPENDENT_AMBULATORY_CARE_PROVIDER_SITE_OTHER): Payer: Self-pay | Admitting: Cardiovascular Disease

## 2022-12-02 ENCOUNTER — Encounter: Payer: Self-pay | Admitting: Cardiovascular Disease

## 2022-12-02 VITALS — BP 108/74 | HR 66 | Ht 75.0 in | Wt 201.0 lb

## 2022-12-02 DIAGNOSIS — Z9861 Coronary angioplasty status: Secondary | ICD-10-CM | POA: Diagnosis not present

## 2022-12-02 DIAGNOSIS — I10 Essential (primary) hypertension: Secondary | ICD-10-CM | POA: Diagnosis not present

## 2022-12-02 DIAGNOSIS — I2510 Atherosclerotic heart disease of native coronary artery without angina pectoris: Secondary | ICD-10-CM | POA: Diagnosis not present

## 2022-12-02 MED ORDER — NITROGLYCERIN 0.4 MG SL SUBL
0.4000 mg | SUBLINGUAL_TABLET | SUBLINGUAL | 3 refills | Status: DC | PRN
Start: 2022-12-02 — End: 2023-02-03

## 2022-12-02 NOTE — Assessment & Plan Note (Signed)
Well controlled today.

## 2022-12-02 NOTE — Progress Notes (Signed)
Cardiology Office Note   Date:  12/02/2022   ID:  Keith Gibbs, DOB June 10, 1961, MRN [id number]  PCP:  [name], MD  Cardiologist:  Adrian Blackwater, MD      History of Present Illness: Keith Gibbs is a 62 y.o. male who presents for  Chief Complaint  Patient presents with   Follow-up    1 week follow up    Patient in office for heart catheterization follow up. Denies chest pain, shortness of breath. Continues to smoke cigarettes.     Past Medical History:  Diagnosis Date   Anginal pain (HCC)    Anxiety    Arthritis    Bipolar 1 disorder (HCC)    Bursitis    CAD (coronary artery disease)    Chronic pain    COPD (chronic obstructive pulmonary disease) (HCC)    Current use of long term anticoagulation    DAPT (ASA + clopidogrel)   Depression    Diverticulitis    Dyspnea    GERD (gastroesophageal reflux disease)    Grade I diastolic dysfunction    Hepatitis C 2012   No longer has Hep C   HLD (hyperlipidemia)    Hypertension    MI (myocardial infarction) (HCC)    Polysubstance abuse (HCC)    cocaine, marijuana, ETOH   PUD (peptic ulcer disease)    S/P angioplasty with stent 06/10/2016   a.) 90% stenosis of pLAD to mLAD - 2.5 x 18 mm Xience Alpine (DES x 1) placed to pLAD   S/P PTCA (percutaneous transluminal coronary angioplasty) 12/04/2019   a.) 60% in stent restenosis of DES to pLAD; LVEF 65%.   Schizophrenia (HCC)    Stroke [facility])    Valvular insufficiency    a.) Mild MR, TR, PR; mild to moderate AR on 03/05/2018 TTE     Past Surgical History:  Procedure Laterality Date   ABDOMINAL SURGERY     removed small piece of intestines due to [facility] Diverticulosis   ANTERIOR LAT LUMBAR FUSION N/A 08/23/2022   Procedure: DIRECT LATERAL INTERBODY FUSION  LUMBAR TWO- LUMBAR THREE, LUMBAR THREE-LUMBAR FOUR , EXPLORE AND EXTEND FUSION LUMBAR TWO-LUMBAR FIVE, POSTERIOR DECOMPRESSION LUMBAR THREE-LUMBAR FOUR, LEFT LUMBAR TWO-LUMBAR THREE;  Surgeon: Bedelia Person, MD;  Location: [facility] OR;  Service: Neurosurgery;  Laterality: N/A;   APPENDECTOMY     BACK SURGERY     CARDIAC CATHETERIZATION Left 06/10/2016   Procedure: Left Heart Cath and Coronary Angiography;  Surgeon: Laurier Nancy, MD;  Location: [facility] INVASIVE CV LAB;  Service: Cardiovascular;  Laterality: Left;   CARDIAC CATHETERIZATION N/A 06/10/2016   Procedure: Coronary Stent Intervention;  Surgeon: Alwyn Pea, MD;  Location: [facility] INVASIVE CV LAB;  Service: Cardiovascular;  Laterality: N/A;   CHOLECYSTECTOMY N/A 02/17/2022   Procedure: LAPAROSCOPIC CHOLECYSTECTOMY;  Surgeon: Sheliah Hatch De Blanch, MD;  Location: [facility] OR;  Service: General;  Laterality: N/A;   COLON SURGERY     COLONOSCOPY     COLONOSCOPY WITH PROPOFOL N/A 01/05/2017   Procedure: COLONOSCOPY WITH PROPOFOL;  Surgeon: Wyline Mood, MD;  Location: [facility] ENDOSCOPY;  Service: Endoscopy;  Laterality: N/A;   COLONOSCOPY WITH PROPOFOL N/A 02/13/2020   Procedure: COLONOSCOPY WITH PROPOFOL;  Surgeon: Pasty Spillers, MD;  Location: [facility] ENDOSCOPY;  Service: Endoscopy;  Laterality: N/A;   CORONARY ANGIOPLASTY WITH STENT PLACEMENT     CORONARY PRESSURE/FFR STUDY N/A 12/04/2019   Procedure: INTRAVASCULAR PRESSURE WIRE/FFR STUDY;  Surgeon: Tonny Bollman, MD;  Location: [facility] INVASIVE CV  LAB;  Service: Cardiovascular;  Laterality: N/A;   ESOPHAGOGASTRODUODENOSCOPY (EGD) WITH PROPOFOL N/A 01/05/2017   Procedure: ESOPHAGOGASTRODUODENOSCOPY (EGD) WITH PROPOFOL;  Surgeon: Wyline Mood, MD;  Location: [facility] ENDOSCOPY;  Service: Endoscopy;  Laterality: N/A;   ESOPHAGOGASTRODUODENOSCOPY (EGD) WITH PROPOFOL N/A 02/13/2020   Procedure: ESOPHAGOGASTRODUODENOSCOPY (EGD) WITH PROPOFOL;  Surgeon: Pasty Spillers, MD;  Location: [facility] ENDOSCOPY;  Service: Endoscopy;  Laterality: N/A;   KNEE ARTHROSCOPY WITH MEDIAL MENISECTOMY Right 09/05/2017   Procedure: KNEE ARTHROSCOPY WITH MEDIAL AND LATERAL  MENISECTOMY PARTIAL SYNOVECTOMY;  Surgeon:  Kennedy Bucker, MD;  Location: [facility] ORS;  Service: Orthopedics;  Laterality: Right;   LEFT HEART CATH AND CORONARY ANGIOGRAPHY N/A 12/04/2019   Procedure: LEFT HEART CATH AND CORONARY ANGIOGRAPHY;  Surgeon: Tonny Bollman, MD;  Location: [facility] INVASIVE CV LAB;  Service: Cardiovascular;  Laterality: N/A;   LEFT HEART CATH AND CORONARY ANGIOGRAPHY Left 11/25/2022   Procedure: LEFT HEART CATH AND CORONARY ANGIOGRAPHY;  Surgeon: Laurier Nancy, MD;  Location: [facility] INVASIVE CV LAB;  Service: Cardiovascular;  Laterality: Left;   SHOULDER SURGERY Right 04/09/2012   SPINE SURGERY     TOTAL KNEE ARTHROPLASTY Right 01/19/2021   Procedure: TOTAL KNEE ARTHROPLASTY - [place] Neighbor to Assist;  Surgeon: Kennedy Bucker, MD;  Location: [facility] ORS;  Service: Orthopedics;  Laterality: Right;     Current Outpatient Medications  Medication Sig Dispense Refill   albuterol (VENTOLIN HFA) 108 (90 Base) MCG/ACT inhaler Inhale 2 puffs into the lungs every 6 (six) hours as needed for wheezing or shortness of breath. 18 g 6   aspirin EC 81 MG tablet Take 1 tablet (81 mg total) by mouth daily. Swallow whole. 30 tablet 12   atorvastatin (LIPITOR) 80 MG tablet TAKE 1 TABLET BY MOUTH EVERY DAY 90 tablet 3   Budeson-Glycopyrrol-Formoterol (BREZTRI AEROSPHERE) 160-9-4.8 MCG/ACT AERO Inhale 2 puffs into the lungs in the morning and at bedtime.     cyclobenzaprine (FLEXERIL) 10 MG tablet Take 1 tablet (10 mg total) by mouth 3 (three) times daily as needed for muscle spasms. 50 tablet 0   diclofenac Sodium (VOLTAREN) 1 % GEL Apply 2 g topically 4 (four) times daily. 2 g 0   divalproex (DEPAKOTE) 500 MG DR tablet Take 1 tablet (500 mg total) by mouth daily. 30 tablet 3   divalproex (DEPAKOTE) 500 MG DR tablet Take 2 tablets (1,000 mg total) by mouth at bedtime. 60 tablet 2   DULoxetine (CYMBALTA) 30 MG capsule Take 3 capsules (90 mg total) by mouth daily. 90 capsule 3   gabapentin (NEURONTIN) 600 MG tablet Take 1 tablet (600 mg total) by  mouth 2 (two) times daily. 60 tablet 2   hydrOXYzine (ATARAX) 25 MG tablet Take 1 tablet (25 mg total) by mouth 3 (three) times daily as needed for anxiety. 90 tablet 3   nitroGLYCERIN (NITROSTAT) 0.4 MG SL tablet Place 1 tablet (0.4 mg total) under the tongue every 5 (five) minutes as needed for chest pain. 100 tablet 3   traZODone (DESYREL) 100 MG tablet Take 1 tablet (100 mg total) by mouth at bedtime as needed for sleep. 30 tablet 3   No current facility-administered medications for this visit.    Allergies:   Asenapine, Latuda [lurasidone hcl], and Lurasidone    Social History:   reports that he has been smoking cigarettes. He has a 16.00 pack-year smoking history. He has been exposed to tobacco smoke. He has never used smokeless tobacco. He reports current alcohol use of about 6.0 standard drinks  of alcohol per week. He reports that he does not currently use drugs after having used the following drugs: Cocaine and Marijuana.   Family History:  family history includes Depression in his mother; Early death in his father; Heart attack in his father; Heart disease in his father and mother; Hypertension in his father, mother, and sister; Osteoarthritis in his mother.    ROS:     Review of Systems  Constitutional: Negative.   HENT: Negative.    Eyes: Negative.   Respiratory: Negative.    Cardiovascular: Negative.   Gastrointestinal: Negative.   Genitourinary: Negative.   Musculoskeletal: Negative.   Skin: Negative.   Neurological: Negative.   Endo/Heme/Allergies: Negative.   Psychiatric/Behavioral: Negative.    All other systems reviewed and are negative.   All other systems are reviewed and negative.   PHYSICAL EXAM: VS:  BP 108/74   Pulse 66   Ht 6\' 3"  (1.905 m)   Wt 201 lb (91.2 kg)   SpO2 91%   BMI 25.12 kg/m  , BMI Body mass index is 25.12 kg/m. Last weight:  Wt Readings from Last 3 Encounters:  12/02/22 201 lb (91.2 kg)  11/25/22 199 lb 8.3 oz (90.5 kg)  11/21/22  199 lb 9.6 oz (90.5 kg)   Physical Exam Vitals reviewed.  Constitutional:      Appearance: Normal appearance. He is normal weight.  HENT:     Head: Normocephalic.     Nose: Nose normal.     Mouth/Throat:     Mouth: Mucous membranes are moist.  Eyes:     Pupils: Pupils are equal, round, and reactive to light.  Cardiovascular:     Rate and Rhythm: Normal rate and regular rhythm.     Pulses: Normal pulses.     Heart sounds: Normal heart sounds.  Pulmonary:     Effort: Pulmonary effort is normal.  Abdominal:     General: Abdomen is flat. Bowel sounds are normal.  Musculoskeletal:        General: Normal range of motion.     Cervical back: Normal range of motion.  Skin:    General: Skin is warm.  Neurological:     General: No focal deficit present.     Mental Status: He is alert.  Psychiatric:        Mood and Affect: Mood normal.     EKG: none today  Recent Labs: 04/26/2022: Magnesium 1.9 08/03/2022: TSH 0.420 08/05/2022: ALT 18 11/21/2022: BUN 15; Creatinine, Ser 0.87; Hemoglobin 16.3; Platelets 226; Potassium 5.2; Sodium 135    Lipid Panel    Component Value Date/Time   CHOL 83 04/26/2022 1530   CHOL 92 (L) 08/31/2021 1408   TRIG 52 04/26/2022 1530   HDL 28 (L) 04/26/2022 1530   HDL 33 (L) 08/31/2021 1408   CHOLHDL 3.0 04/26/2022 1530   VLDL 10 04/26/2022 1530   LDLCALC 45 04/26/2022 1530   LDLCALC 41 08/31/2021 1408     ASSESSMENT AND PLAN:    ICD-10-CM   1. CAD S/P percutaneous coronary angioplasty  I25.10 nitroGLYCERIN (NITROSTAT) 0.4 MG SL tablet   Z98.61     2. Essential hypertension  I10        Problem List Items Addressed This Visit       Cardiovascular and Mediastinum   CAD S/P percutaneous coronary angioplasty - Primary (Chronic)    Left heat cath 11/2022 Stent LAD no restenosis, LCX/RCA, and LVEF normal, medical treatment. Patient denies chest pain.  Relevant Medications   nitroGLYCERIN (NITROSTAT) 0.4 MG SL tablet   Essential  hypertension    Well controlled today.       Relevant Medications   nitroGLYCERIN (NITROSTAT) 0.4 MG SL tablet     Disposition:   Return in about 3 months (around 03/04/2023).    Total time spent: 30 minutes  Signed,  Adrian Blackwater, MD  12/02/2022 1:20 PM    [facility]

## 2022-12-02 NOTE — Assessment & Plan Note (Signed)
Left heat cath 11/2022 Stent LAD no restenosis, LCX/RCA, and LVEF normal, medical treatment. Patient denies chest pain.

## 2022-12-07 ENCOUNTER — Ambulatory Visit: Payer: Medicaid Other | Admitting: Internal Medicine

## 2022-12-08 NOTE — Progress Notes (Signed)
Patient ID: Keith Gibbs, male    DOB: [birth year]  MRN: [id number]  CC: Shoulder Pain  Subjective: Keith Gibbs is a 62 y.o. male who presents for shoulder pain.   His concerns today include:  - Chronic right shoulder pain x 3 years. Reports history of surgery of the same shoulder. He denies recent trauma/injury. Reports in the past Oxycodone and Tramadol helped. Reports he was recently prescribed Oxycodone for back pain from his orthopedist. Reports he no longer has any Oxycodone left due to his orthopedist will not refill. Reports NSAID's do not help with pain. He would like a referral to Orthopedics.  - Reports since last visit he did not establish with ENT for deviated nasal septum and requests referral back to the same.  - No further issues/concerns for discussion today.   Patient Active Problem List   Diagnosis Date Noted   S/P lumbar fusion 08/23/2022   Postlaminectomy syndrome, not elsewhere classified 08/12/2022   MDD (major depressive disorder) 08/10/2022   Substance induced mood disorder (HCC) 08/06/2022   Suicide attempt by drug overdose (HCC) 08/06/2022   Toxic encephalopathy 08/03/2022   Suicidal ideation 08/03/2022   Encephalopathy 08/03/2022   Polysubstance abuse (HCC) 04/28/2022   Postoperative abdominal pain 02/28/2022   Acute cholecystitis 02/17/2022   Obstruction of nasal valve 01/07/2022   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   Pulmonary nodule 12/17/2021   CAD S/P percutaneous coronary angioplasty 12/17/2021   Epigastric abdominal pain    Nausea and vomiting    Tobacco dependence due to cigarettes 08/31/2021   Iron deficiency anemia secondary to inadequate dietary iron intake 08/31/2021   Acute lead-induced gout involving toe of left foot 08/31/2021   Encounter for lipid screening for cardiovascular disease 08/31/2021   Hypotension due to drugs 06/21/2021   Opioid withdrawal (HCC) 06/21/2021   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HCC) 06/21/2021   RLS (restless legs syndrome)  05/07/2021   Acquired deviated nasal septum 05/03/2021   Encounter for surveillance of abnormal nevi 05/03/2021   Flu vaccine need 05/03/2021   Substance abuse (HCC) 05/03/2021   Drug abuse and dependence (HCC) 05/03/2021   Need for shingles vaccine 05/03/2021   Right groin pain 01/29/2021   S/P TKR (total knee replacement) using cement, right 01/19/2021   Cigarette smoker 07/21/2020   Foot pain, bilateral 12/25/2019   Potential exposure to STD 12/12/2019   History of lumbar surgery 12/12/2019   Fatigue 12/12/2019   Penile lesion 12/12/2019   Right testicular pain 12/12/2019   Body mass index 28.0-28.9, adult 12/12/2019   History of COPD 12/12/2019   Tobacco use disorder, continuous 12/12/2019   Hepatoma (HCC) 11/05/2019   Anxiety 12/10/2018   Essential hypertension 09/18/2018   Hypercholesteremia 09/18/2018   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   Chronic anticoagulation (Plavix) 04/19/2018   Pain medication agreement broken ([facility]) 04/19/2018   Cocaine use 04/18/2018   Cocaine abuse (HCC) (See 04/12/18 UDS) 04/18/2018   Marijuana abuse (See 04/12/18 UDS) 04/18/2018   Long term current use of opiate analgesic 04/12/2018   Cirrhosis of liver (HCC) 04/03/2018   BPH (benign prostatic hyperplasia) 04/03/2018   Hematuria, microscopic 04/03/2018   Osteoarthritis of the knee (Left) 11/02/2017   Lumbar facet syndrome (Bilateral) 11/01/2017   Spondylosis without myelopathy or radiculopathy, lumbosacral region 11/01/2017   Osteoarthritis of shoulder (Right) 11/01/2017   Osteoarthritis of acromioclavicular joint (Right) 11/01/2017   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) 11/01/2017   Osteoarthritis 11/01/2017   Cervicalgia 11/01/2017   Cervical facet syndrome 11/01/2017  DDD (degenerative disc disease), cervical 11/01/2017   DDD (degenerative disc disease), lumbar 11/01/2017   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) 09/18/2017   Other chronic pain 09/18/2017    Chronic musculoskeletal pain 09/18/2017   Chronic pain of right knee 09/11/2017   Chronic low back pain (Secondary Area of Pain) (Bilateral) 09/11/2017   Chronic hip pain [facility] Area of Pain) (Bilateral) (R>L) 09/11/2017   Chronic shoulder pain (Fourth Area of Pain) (Right) 09/11/2017   Spondylosis without myelopathy or radiculopathy, cervical region 09/11/2017   Chronic pain syndrome 09/11/2017   Opiate use 09/11/2017   Screen for STD (sexually transmitted disease) 09/11/2017   Disorder of skeletal system 09/11/2017   Problems influencing health status 09/11/2017   Overdose of medication, intentional self-harm, sequela (HCC) 12/28/2016   Severe recurrent major depression without psychotic features (HCC) 12/27/2016   Alcohol use disorder, moderate, dependence (HCC) 12/27/2016   Cannabis use disorder, moderate, dependence (HCC) 12/27/2016   Chest pain 06/10/2016   Bipolar 1 disorder (HCC) 05/27/2013   Myalgia 05/27/2013   Abnormal ejaculation 11/30/2012   Chest pain, unspecified 11/30/2012   Degenerative arthritis of hip 11/16/2012   Schizoaffective disorder (HCC) 06/27/2012   COPD (chronic obstructive pulmonary disease) (HCC) 06/27/2012   Chronic hepatitis C (HCC) 06/27/2012   GERD (gastroesophageal reflux disease) 06/27/2012   Complete rupture of rotator cuff 02/10/2012   Cocaine abuse in remission (HCC) 12/05/2011   Unspecified osteoarthritis, unspecified site 07/27/2011     Current Outpatient Medications on File Prior to Visit  Medication Sig Dispense Refill   albuterol (VENTOLIN HFA) 108 (90 Base) MCG/ACT inhaler Inhale 2 puffs into the lungs every 6 (six) hours as needed for wheezing or shortness of breath. 18 g 6   aspirin EC 81 MG tablet Take 1 tablet (81 mg total) by mouth daily. Swallow whole. 30 tablet 12   atorvastatin (LIPITOR) 80 MG tablet TAKE 1 TABLET BY MOUTH EVERY DAY 90 tablet 3   Budeson-Glycopyrrol-Formoterol (BREZTRI AEROSPHERE) 160-9-4.8 MCG/ACT AERO Inhale  2 puffs into the lungs in the morning and at bedtime.     cyclobenzaprine (FLEXERIL) 10 MG tablet Take 1 tablet (10 mg total) by mouth 3 (three) times daily as needed for muscle spasms. 50 tablet 0   diclofenac Sodium (VOLTAREN) 1 % GEL Apply 2 g topically 4 (four) times daily. 2 g 0   divalproex (DEPAKOTE) 500 MG DR tablet Take 1 tablet (500 mg total) by mouth daily. 30 tablet 3   divalproex (DEPAKOTE) 500 MG DR tablet Take 2 tablets (1,000 mg total) by mouth at bedtime. 60 tablet 2   DULoxetine (CYMBALTA) 30 MG capsule Take 3 capsules (90 mg total) by mouth daily. 90 capsule 3   gabapentin (NEURONTIN) 600 MG tablet Take 1 tablet (600 mg total) by mouth 2 (two) times daily. 60 tablet 2   hydrOXYzine (ATARAX) 25 MG tablet Take 1 tablet (25 mg total) by mouth 3 (three) times daily as needed for anxiety. 90 tablet 3   nitroGLYCERIN (NITROSTAT) 0.4 MG SL tablet Place 1 tablet (0.4 mg total) under the tongue every 5 (five) minutes as needed for chest pain. 100 tablet 3   traZODone (DESYREL) 100 MG tablet Take 1 tablet (100 mg total) by mouth at bedtime as needed for sleep. 30 tablet 3   No current facility-administered medications on file prior to visit.    Allergies  Allergen Reactions   Asenapine Other (See Comments) and Nausea And Vomiting    Increased tremors   Latuda [Lurasidone  Hcl] Other (See Comments)    Tremors     Lurasidone Other (See Comments)    Social History   Socioeconomic History   Marital status: Widowed    Spouse name: Not on file   Number of children: Not on file   Years of education: Not on file   Highest education level: Not on file  Occupational History   Occupation: disability   Occupation: stocking at gas station    Comment: 2 days per week  Tobacco Use   Smoking status: Some Days    Packs/day: 0.50    Years: 32.00    Additional pack years: 0.00    Total pack years: 16.00    Types: Cigarettes    Last attempt to quit: 10/25/2021    Years since quitting:  1.1    Passive exposure: Current   Smokeless tobacco: Never   Tobacco comments:    Continues to smoke approximately 5 cigarettes per day.  Has been 2 days since las cigarette.  Has not bought any more.  Using patches and gum.  5/23 hfb  Vaping Use   Vaping Use: Never used  Substance and Sexual Activity   Alcohol use: Yes    Alcohol/week: 6.0 standard drinks of alcohol    Types: 6 Cans of beer per week    Comment: occassionally    Drug use: Not Currently    Types: Cocaine, Marijuana    Comment: last on saturday   Sexual activity: Yes    Partners: Female    Birth control/protection: None  Other Topics Concern   Not on file  Social History Narrative   Not on file   Social Determinants of Health   Financial Resource Strain: Not on file  Food Insecurity: No Food Insecurity (08/23/2022)   Hunger Vital Sign    Worried About Running Out of Food in the Last Year: Never true    Ran Out of Food in the Last Year: Never true  Transportation Needs: No Transportation Needs (08/23/2022)   PRAPARE - Administrator, Civil Service (Medical): No    Lack of Transportation (Non-Medical): No  Physical Activity: Not on file  Stress: Not on file  Social Connections: Not on file  Intimate Partner Violence: Not At Risk (08/23/2022)   Humiliation, Afraid, Rape, and Kick questionnaire    Fear of Current or Ex-Partner: No    Emotionally Abused: No    Physically Abused: No    Sexually Abused: No    Family History  Problem Relation Age of Onset   Osteoarthritis Mother    Heart disease Mother    Hypertension Mother    Depression Mother    Heart disease Father    Early death Father    Hypertension Father    Heart attack Father    Hypertension Sister    Prostate cancer Neg Hx    Bladder Cancer Neg Hx    Kidney cancer Neg Hx     Past Surgical History:  Procedure Laterality Date   ABDOMINAL SURGERY     removed small piece of intestines due to [facility] Diverticulosis   ANTERIOR LAT  LUMBAR FUSION N/A 08/23/2022   Procedure: DIRECT LATERAL INTERBODY FUSION  LUMBAR TWO- LUMBAR THREE, LUMBAR THREE-LUMBAR FOUR , EXPLORE AND EXTEND FUSION LUMBAR TWO-LUMBAR FIVE, POSTERIOR DECOMPRESSION LUMBAR THREE-LUMBAR FOUR, LEFT LUMBAR TWO-LUMBAR THREE;  Surgeon: Bedelia Person, MD;  Location: [facility] OR;  Service: Neurosurgery;  Laterality: N/A;   APPENDECTOMY     BACK SURGERY  CARDIAC CATHETERIZATION Left 06/10/2016   Procedure: Left Heart Cath and Coronary Angiography;  Surgeon: Laurier Nancy, MD;  Location: [facility] INVASIVE CV LAB;  Service: Cardiovascular;  Laterality: Left;   CARDIAC CATHETERIZATION N/A 06/10/2016   Procedure: Coronary Stent Intervention;  Surgeon: Alwyn Pea, MD;  Location: [facility] INVASIVE CV LAB;  Service: Cardiovascular;  Laterality: N/A;   CHOLECYSTECTOMY N/A 02/17/2022   Procedure: LAPAROSCOPIC CHOLECYSTECTOMY;  Surgeon: Sheliah Hatch De Blanch, MD;  Location: [facility] OR;  Service: General;  Laterality: N/A;   COLON SURGERY     COLONOSCOPY     COLONOSCOPY WITH PROPOFOL N/A 01/05/2017   Procedure: COLONOSCOPY WITH PROPOFOL;  Surgeon: Wyline Mood, MD;  Location: [facility] ENDOSCOPY;  Service: Endoscopy;  Laterality: N/A;   COLONOSCOPY WITH PROPOFOL N/A 02/13/2020   Procedure: COLONOSCOPY WITH PROPOFOL;  Surgeon: Pasty Spillers, MD;  Location: [facility] ENDOSCOPY;  Service: Endoscopy;  Laterality: N/A;   CORONARY ANGIOPLASTY WITH STENT PLACEMENT     CORONARY PRESSURE/FFR STUDY N/A 12/04/2019   Procedure: INTRAVASCULAR PRESSURE WIRE/FFR STUDY;  Surgeon: Tonny Bollman, MD;  Location: [facility] INVASIVE CV LAB;  Service: Cardiovascular;  Laterality: N/A;   ESOPHAGOGASTRODUODENOSCOPY (EGD) WITH PROPOFOL N/A 01/05/2017   Procedure: ESOPHAGOGASTRODUODENOSCOPY (EGD) WITH PROPOFOL;  Surgeon: Wyline Mood, MD;  Location: [facility] ENDOSCOPY;  Service: Endoscopy;  Laterality: N/A;   ESOPHAGOGASTRODUODENOSCOPY (EGD) WITH PROPOFOL N/A 02/13/2020   Procedure: ESOPHAGOGASTRODUODENOSCOPY (EGD) WITH  PROPOFOL;  Surgeon: Pasty Spillers, MD;  Location: [facility] ENDOSCOPY;  Service: Endoscopy;  Laterality: N/A;   KNEE ARTHROSCOPY WITH MEDIAL MENISECTOMY Right 09/05/2017   Procedure: KNEE ARTHROSCOPY WITH MEDIAL AND LATERAL  MENISECTOMY PARTIAL SYNOVECTOMY;  Surgeon: Kennedy Bucker, MD;  Location: [facility] ORS;  Service: Orthopedics;  Laterality: Right;   LEFT HEART CATH AND CORONARY ANGIOGRAPHY N/A 12/04/2019   Procedure: LEFT HEART CATH AND CORONARY ANGIOGRAPHY;  Surgeon: Tonny Bollman, MD;  Location: [facility] INVASIVE CV LAB;  Service: Cardiovascular;  Laterality: N/A;   LEFT HEART CATH AND CORONARY ANGIOGRAPHY Left 11/25/2022   Procedure: LEFT HEART CATH AND CORONARY ANGIOGRAPHY;  Surgeon: Laurier Nancy, MD;  Location: [facility] INVASIVE CV LAB;  Service: Cardiovascular;  Laterality: Left;   SHOULDER SURGERY Right 04/09/2012   SPINE SURGERY     TOTAL KNEE ARTHROPLASTY Right 01/19/2021   Procedure: TOTAL KNEE ARTHROPLASTY - [place] Neighbor to Assist;  Surgeon: Kennedy Bucker, MD;  Location: [facility] ORS;  Service: Orthopedics;  Laterality: Right;    ROS: Review of Systems Negative except as stated above  PHYSICAL EXAM: BP 103/62 (BP Location: Left Arm, Patient Position: Sitting, Cuff Size: Normal)   Pulse (!) 50   Temp 98.7 F (37.1 C)   Resp 16   Ht 6\' 3"  (1.905 m)   Wt 204 lb (92.5 kg)   SpO2 97%   BMI 25.50 kg/m   Physical Exam HENT:     Head: Normocephalic and atraumatic.     Nose: Nose normal.     Mouth/Throat:     Mouth: Mucous membranes are moist.     Pharynx: Oropharynx is clear.  Eyes:     Extraocular Movements: Extraocular movements intact.     Conjunctiva/sclera: Conjunctivae normal.     Pupils: Pupils are equal, round, and reactive to light.  Cardiovascular:     Rate and Rhythm: Normal rate and regular rhythm.     Pulses: Normal pulses.     Heart sounds: Normal heart sounds.  Pulmonary:     Effort: Pulmonary effort is normal.     Breath sounds: Normal breath  sounds.   Musculoskeletal:     Right shoulder: Decreased range of motion.     Left shoulder: Normal.     Right upper arm: Normal.     Left upper arm: Normal.     Right elbow: Normal.     Left elbow: Normal.     Right forearm: Normal.     Left forearm: Normal.     Right wrist: Normal.     Left wrist: Normal.     Right hand: Normal.     Left hand: Normal.     Cervical back: Normal range of motion and neck supple.  Neurological:     General: No focal deficit present.     Mental Status: He is alert and oriented to person, place, and time.  Psychiatric:        Mood and Affect: Mood normal.        Behavior: Behavior normal.      ASSESSMENT AND PLAN: 1. Chronic right shoulder pain 2. History of shoulder surgery - Tramadol as prescribed. Counseled on medication adherence/adverse effects. - I did check the [facility] prescription drug database. - Referral to Orthopedics for further evaluation/management. During the interim follow-up with primary provider as scheduled.   - Ambulatory referral to Orthopedics - traMADol (ULTRAM) 50 MG tablet; Take 1 tablet (50 mg total) by mouth every 8 (eight) hours as needed for up to 5 days.  Dispense: 15 tablet; Refill: 0  3. Acquired deviated nasal septum - Referral to ENT for further evaluation/management.  - Ambulatory referral to ENT   Patient was given the opportunity to ask questions.  Patient verbalized understanding of the plan and was able to repeat key elements of the plan. Patient was given clear instructions to go to Emergency Department or return to medical center if symptoms don't improve, worsen, or new problems develop.The patient verbalized understanding.   Orders Placed This Encounter  Procedures   Ambulatory referral to Orthopedics   Ambulatory referral to ENT     Requested Prescriptions   Signed Prescriptions Disp Refills   traMADol (ULTRAM) 50 MG tablet 15 tablet 0    Sig: Take 1 tablet (50 mg total) by mouth every 8  (eight) hours as needed for up to 5 days.    Follow-up with primary provider as scheduled.  Rema Fendt, NP

## 2022-12-13 ENCOUNTER — Encounter: Payer: Self-pay | Admitting: Family

## 2022-12-13 ENCOUNTER — Ambulatory Visit (INDEPENDENT_AMBULATORY_CARE_PROVIDER_SITE_OTHER): Payer: Medicaid Other | Admitting: Family

## 2022-12-13 VITALS — BP 103/62 | HR 50 | Temp 98.7°F | Resp 16 | Ht 75.0 in | Wt 204.0 lb

## 2022-12-13 DIAGNOSIS — M25511 Pain in right shoulder: Secondary | ICD-10-CM

## 2022-12-13 DIAGNOSIS — G8929 Other chronic pain: Secondary | ICD-10-CM | POA: Diagnosis not present

## 2022-12-13 DIAGNOSIS — Z9889 Other specified postprocedural states: Secondary | ICD-10-CM | POA: Diagnosis not present

## 2022-12-13 DIAGNOSIS — J342 Deviated nasal septum: Secondary | ICD-10-CM

## 2022-12-13 MED ORDER — TRAMADOL HCL 50 MG PO TABS
50.0000 mg | ORAL_TABLET | Freq: Three times a day (TID) | ORAL | 0 refills | Status: DC | PRN
Start: 2022-12-13 — End: 2022-12-20

## 2022-12-13 NOTE — Progress Notes (Signed)
Pt is here for shoulder pain   Complaining of right shoulder pain, pain is chronic but has worsened in the past X3 months   Requesting referral for ENT
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
## 2022-12-16 ENCOUNTER — Encounter (HOSPITAL_COMMUNITY): Payer: Self-pay | Admitting: Student in an Organized Health Care Education/Training Program

## 2022-12-19 ENCOUNTER — Other Ambulatory Visit: Payer: Self-pay | Admitting: Family

## 2022-12-19 DIAGNOSIS — Z9889 Other specified postprocedural states: Secondary | ICD-10-CM

## 2022-12-19 DIAGNOSIS — G8929 Other chronic pain: Secondary | ICD-10-CM

## 2022-12-20 ENCOUNTER — Ambulatory Visit: Payer: Self-pay | Admitting: *Deleted

## 2022-12-20 NOTE — Telephone Encounter (Signed)
  Chief Complaint: right shoulder pain and requesting refill of medications ultram and flexeril. Symptoms: difficulty using right arm. Severe pain with reaching out difficulty picking something up.  Frequency: na Pertinent Negatives: Patient denies N/T Disposition: [] ED /[] Urgent Care (no appt availability in office) / [] Appointment(In office/virtual)/ []  [facility]/ [] Home Care/ [] Refused Recommended Disposition /[] [place] [place] [facility]/ [x]  Follow-up with PCP Additional Notes:   Requesting pain medication enough until appt with referred ortho dr on 12/28/22. Requesting ultram and flexeril . Recommended appt may be needed. Please advise. Patient would like a call back .     Reason for Disposition  [1] Unable to use arm at all AND [2] because of shoulder pain or stiffness  Answer Assessment - Initial Assessment Questions 1. ONSET: "When did the pain start?"     Has been seen before  2. LOCATION: "Where is the pain located?"     Right shoulder to elbow 3. PAIN: "How bad is the pain?" (Scale 1-10; or mild, moderate, severe)   - MILD (1-3): doesn't interfere with normal activities   - MODERATE (4-7): interferes with normal activities (e.g., work or school) or awakens from sleep   - SEVERE (8-10): excruciating pain, unable to do any normal activities, unable to move arm at all due to pain     severe 4. WORK OR EXERCISE: "Has there been any recent work or exercise that involved this part of the body?"     Na  5. CAUSE: "What do you think is causing the shoulder pain?"     Na  6. OTHER SYMPTOMS: "Do you have any other symptoms?" (e.g., neck pain, swelling, rash, fever, numbness, weakness)     Denies. Pain right shoulder difficulty reaching out and picking something up 7. PREGNANCY: "Is there any chance you are pregnant?" "When was your last menstrual period?"     Na  Protocols used: Shoulder Pain-A-AH

## 2022-12-20 NOTE — Telephone Encounter (Signed)
Complete

## 2022-12-28 ENCOUNTER — Other Ambulatory Visit (INDEPENDENT_AMBULATORY_CARE_PROVIDER_SITE_OTHER): Payer: Medicaid Other

## 2022-12-28 ENCOUNTER — Ambulatory Visit (INDEPENDENT_AMBULATORY_CARE_PROVIDER_SITE_OTHER): Payer: Medicaid Other | Admitting: Orthopedic Surgery

## 2022-12-28 ENCOUNTER — Encounter: Payer: Self-pay | Admitting: Orthopedic Surgery

## 2022-12-28 DIAGNOSIS — M79601 Pain in right arm: Secondary | ICD-10-CM

## 2022-12-28 NOTE — Progress Notes (Signed)
Office Visit Note   Patient: Keith Gibbs           Date of Birth: 1961/04/30           MRN: [id number] Visit Date: 12/28/2022 Requested by: Rema Fendt, NP [address] [place],  [place] [address] PCP: Rema Fendt, NP  Subjective: Chief Complaint  Patient presents with   Right Shoulder - Pain   Right Elbow - Pain   Neck - Pain    HPI: Keith Gibbs is a 62 y.o. male who presents to the office reporting right shoulder pain which is chronic.  Has been going on for years but recently worsened over the past 2 months.  He reports decreased range of motion and decreased strength.  Constant pain but worse with reaching.  The pain does wake him from sleep at night.  He is right-hand dominant.  Has difficulties with ADLs.  Cannot lift a cup or pitcher without pain.  Does have some radiating pain into the shoulder with some numbness and tingling into the forearm.  He also describes inability to fully extend the elbow.  Describes neck pain as well but not too much in terms of scapular pain.  He does report a history of prior open right shoulder surgery which sounds like it may have been subscapularis repair.  All notes were reviewed.  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  He reports pain "24/7".  Did have heart catheterization 11/25/2022..                ROS: All systems reviewed are negative as they relate to the chief complaint within the history of present illness.  Patient denies fevers or chills.  Assessment & Plan: Visit Diagnoses:  1. Right arm pain     Plan: Impression is right elbow pain with arthritis and some restriction of motion.  Nothing really to do about that.  He has history of shoulder pain which based on the constant nature of the pain could be referred pain from his neck.  I think would be good for diagnostic and therapeutic purposes to try a cervical spine ESI for  right-sided radiculopathy.  He also needs right shoulder MRI arthrogram to evaluate possible rotator cuff tear versus biceps tendon pathology.  Shoulder examination is fairly unremarkable but he is having some popping and mechanical symptoms in that shoulder by history.  Follow-up after those studies.  Follow-Up Instructions: No follow-ups on file.   Orders:  Orders Placed This Encounter  Procedures   XR Cervical Spine 2 or 3 views   XR Shoulder Right   XR Elbow 2 Views Right   MR SHOULDER RIGHT W CONTRAST   Arthrogram   Ambulatory referral to Physical Medicine Rehab   No orders of the defined types were placed in this encounter.     Procedures: No procedures performed   Clinical Data: No additional findings.  Objective: Vital Signs: There were no vitals taken for this visit.  Physical Exam:  Constitutional: Patient appears well-developed HEENT:  Head: Normocephalic Eyes:EOM are normal Neck: Normal range of motion Cardiovascular: Normal rate Pulmonary/chest: Effort normal Neurologic: Patient is alert Skin: Skin is warm Psychiatric: Patient has normal mood and affect  Ortho Exam: Ortho exam demonstrates pretty reasonable cervical spine range of motion with flexion chin to chest extension 50 degrees and rotation 60 degrees bilaterally.  He has 5  out of 5 grip EPL FPL interosseous wrist flexion extension bicep triceps and deltoid strength.  He had actually pretty reasonable rotator cuff strength infraspinatus supraspinatus and subscap testing bilaterally.  Well-healed surgical deltopectoral incision on the right-hand side.  Prominent but nontender AC joints bilaterally.  Range of motion on the right is 70/95/150.  On the right elbow he has full pronation supination but about a 20 degree flexion contracture and only 110 of flexion.  Radial pulses intact.  Specialty Comments:  No specialty comments available.  Imaging: XR Cervical Spine 2 or 3 views  Result Date:  12/28/2022 AP lateral radiographs cervical spine reviewed.  Significant degenerative disc disease and arthritis present at C5-6.  Some spinal listhesis noted at C4-5.  No acute fracture.  Facet arthritis also present in the mid to lower cervical spine region.  XR Shoulder Right  Result Date: 12/28/2022 AP axillary outlet radiographs right shoulder reviewed.  Shoulder is located.  Slight diminishment of acromiohumeral distance is present.  No acute fracture.  Visualized lung fields clear.  No glenohumeral joint arthritis and moderate AC joint arthritis noted.  XR Elbow 2 Views Right  Result Date: 12/28/2022 AP lateral radiographs right elbow reviewed.  Moderate ulnohumeral arthritis is present with no loose bodies.  No acute fracture.  Joint is located.    PMFS History: Patient Active Problem List   Diagnosis Date Noted   S/P lumbar fusion 08/23/2022   Postlaminectomy syndrome, not elsewhere classified 08/12/2022   MDD (major depressive disorder) 08/10/2022   Substance induced mood disorder (HCC) 08/06/2022   Suicide attempt by drug overdose (HCC) 08/06/2022   Toxic encephalopathy 08/03/2022   Suicidal ideation 08/03/2022   Encephalopathy 08/03/2022   Polysubstance abuse (HCC) 04/28/2022   Postoperative abdominal pain 02/28/2022   Acute cholecystitis 02/17/2022   Obstruction of nasal valve 01/07/2022   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   Pulmonary nodule 12/17/2021   CAD S/P percutaneous coronary angioplasty 12/17/2021   Epigastric abdominal pain    Nausea and vomiting    Tobacco dependence due to cigarettes 08/31/2021   Iron deficiency anemia secondary to inadequate dietary iron intake 08/31/2021   Acute lead-induced gout involving toe of left foot 08/31/2021   Encounter for lipid screening for cardiovascular disease 08/31/2021   Hypotension due to drugs 06/21/2021   Opioid withdrawal (HCC) 06/21/2021   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HCC) 06/21/2021   RLS (restless legs syndrome) 05/07/2021   Acquired  deviated nasal septum 05/03/2021   Encounter for surveillance of abnormal nevi 05/03/2021   Flu vaccine need 05/03/2021   Substance abuse (HCC) 05/03/2021   Drug abuse and dependence (HCC) 05/03/2021   Need for shingles vaccine 05/03/2021   Right groin pain 01/29/2021   S/P TKR (total knee replacement) using cement, right 01/19/2021   Cigarette smoker 07/21/2020   Foot pain, bilateral 12/25/2019   Potential exposure to STD 12/12/2019   History of lumbar surgery 12/12/2019   Fatigue 12/12/2019   Penile lesion 12/12/2019   Right testicular pain 12/12/2019   Body mass index 28.0-28.9, adult 12/12/2019   History of COPD 12/12/2019   Tobacco use disorder, continuous 12/12/2019   Hepatoma (HCC) 11/05/2019   Anxiety 12/10/2018   Essential hypertension 09/18/2018   Hypercholesteremia 09/18/2018   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   Chronic anticoagulation (Plavix) 04/19/2018   Pain medication agreement broken ([facility]) 04/19/2018   Cocaine use 04/18/2018   Cocaine abuse (HCC) (See 04/12/18 UDS) 04/18/2018   Marijuana abuse (See 04/12/18 UDS) 04/18/2018   Long term current use of  opiate analgesic 04/12/2018   Cirrhosis of liver (HCC) 04/03/2018   BPH (benign prostatic hyperplasia) 04/03/2018   Hematuria, microscopic 04/03/2018   Osteoarthritis of the knee (Left) 11/02/2017   Lumbar facet syndrome (Bilateral) 11/01/2017   Spondylosis without myelopathy or radiculopathy, lumbosacral region 11/01/2017   Osteoarthritis of shoulder (Right) 11/01/2017   Osteoarthritis of acromioclavicular joint (Right) 11/01/2017   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) 11/01/2017   Osteoarthritis 11/01/2017   Cervicalgia 11/01/2017   Cervical facet syndrome 11/01/2017   DDD (degenerative disc disease), cervical 11/01/2017   DDD (degenerative disc disease), lumbar 11/01/2017   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) 09/18/2017   Other chronic pain 09/18/2017   Chronic musculoskeletal  pain 09/18/2017   Chronic pain of right knee 09/11/2017   Chronic low back pain (Secondary Area of Pain) (Bilateral) 09/11/2017   Chronic hip pain (Tertiary Area of Pain) (Bilateral) (R>L) 09/11/2017   Chronic shoulder pain (Fourth Area of Pain) (Right) 09/11/2017   Spondylosis without myelopathy or radiculopathy, cervical region 09/11/2017   Chronic pain syndrome 09/11/2017   Opiate use 09/11/2017   Screen for STD (sexually transmitted disease) 09/11/2017   Disorder of skeletal system 09/11/2017   Problems influencing health status 09/11/2017   Overdose of medication, intentional self-harm, sequela (HCC) 12/28/2016   Severe recurrent major depression without psychotic features (HCC) 12/27/2016   Alcohol use disorder, moderate, dependence (HCC) 12/27/2016   Cannabis use disorder, moderate, dependence (HCC) 12/27/2016   Chest pain 06/10/2016   Bipolar 1 disorder (HCC) 05/27/2013   Myalgia 05/27/2013   Abnormal ejaculation 11/30/2012   Chest pain, unspecified 11/30/2012   Degenerative arthritis of hip 11/16/2012   Schizoaffective disorder (HCC) 06/27/2012   COPD (chronic obstructive pulmonary disease) (HCC) 06/27/2012   Chronic hepatitis C (HCC) 06/27/2012   GERD (gastroesophageal reflux disease) 06/27/2012   Complete rupture of rotator cuff 02/10/2012   Cocaine abuse in remission (HCC) 12/05/2011   Unspecified osteoarthritis, unspecified site 07/27/2011   Past Medical History:  Diagnosis Date   Anginal pain (HCC)    Anxiety    Arthritis    Bipolar 1 disorder (HCC)    Bursitis    CAD (coronary artery disease)    Chronic pain    COPD (chronic obstructive pulmonary disease) (HCC)    Current use of long term anticoagulation    DAPT (ASA + clopidogrel)   Depression    Diverticulitis    Dyspnea    GERD (gastroesophageal reflux disease)    Grade I diastolic dysfunction    Hepatitis C 2012   No longer has Hep C   HLD (hyperlipidemia)    Hypertension    MI (myocardial  infarction) (HCC)    Polysubstance abuse (HCC)    cocaine, marijuana, ETOH   PUD (peptic ulcer disease)    S/P angioplasty with stent 06/10/2016   a.) 90% stenosis of pLAD to mLAD - 2.5 x 18 mm Xience Alpine (DES x 1) placed to pLAD   S/P PTCA (percutaneous transluminal coronary angioplasty) 12/04/2019   a.) 60% in stent restenosis of DES to pLAD; LVEF 65%.   Schizophrenia (HCC)    Stroke [facility])    Valvular insufficiency    a.) Mild MR, TR, PR; mild to moderate AR on 03/05/2018 TTE    Family History  Problem Relation Age of Onset   Osteoarthritis Mother    Heart disease Mother    Hypertension Mother    Depression Mother    Heart disease Father    Early death Father  Hypertension Father    Heart attack Father    Hypertension Sister    Prostate cancer Neg Hx    Bladder Cancer Neg Hx    Kidney cancer Neg Hx     Past Surgical History:  Procedure Laterality Date   ABDOMINAL SURGERY     removed small piece of intestines due to [facility] Diverticulosis   ANTERIOR LAT LUMBAR FUSION N/A 08/23/2022   Procedure: DIRECT LATERAL INTERBODY FUSION  LUMBAR TWO- LUMBAR THREE, LUMBAR THREE-LUMBAR FOUR , EXPLORE AND EXTEND FUSION LUMBAR TWO-LUMBAR FIVE, POSTERIOR DECOMPRESSION LUMBAR THREE-LUMBAR FOUR, LEFT LUMBAR TWO-LUMBAR THREE;  Surgeon: Bedelia Person, MD;  Location: [facility] OR;  Service: Neurosurgery;  Laterality: N/A;   APPENDECTOMY     BACK SURGERY     CARDIAC CATHETERIZATION Left 06/10/2016   Procedure: Left Heart Cath and Coronary Angiography;  Surgeon: Laurier Nancy, MD;  Location: [facility] INVASIVE CV LAB;  Service: Cardiovascular;  Laterality: Left;   CARDIAC CATHETERIZATION N/A 06/10/2016   Procedure: Coronary Stent Intervention;  Surgeon: Alwyn Pea, MD;  Location: [facility] INVASIVE CV LAB;  Service: Cardiovascular;  Laterality: N/A;   CHOLECYSTECTOMY N/A 02/17/2022   Procedure: LAPAROSCOPIC CHOLECYSTECTOMY;  Surgeon: Sheliah Hatch De Blanch, MD;  Location: [facility] OR;  Service: General;   Laterality: N/A;   COLON SURGERY     COLONOSCOPY     COLONOSCOPY WITH PROPOFOL N/A 01/05/2017   Procedure: COLONOSCOPY WITH PROPOFOL;  Surgeon: Wyline Mood, MD;  Location: [facility] ENDOSCOPY;  Service: Endoscopy;  Laterality: N/A;   COLONOSCOPY WITH PROPOFOL N/A 02/13/2020   Procedure: COLONOSCOPY WITH PROPOFOL;  Surgeon: Pasty Spillers, MD;  Location: [facility] ENDOSCOPY;  Service: Endoscopy;  Laterality: N/A;   CORONARY ANGIOPLASTY WITH STENT PLACEMENT     CORONARY PRESSURE/FFR STUDY N/A 12/04/2019   Procedure: INTRAVASCULAR PRESSURE WIRE/FFR STUDY;  Surgeon: Tonny Bollman, MD;  Location: [facility] INVASIVE CV LAB;  Service: Cardiovascular;  Laterality: N/A;   ESOPHAGOGASTRODUODENOSCOPY (EGD) WITH PROPOFOL N/A 01/05/2017   Procedure: ESOPHAGOGASTRODUODENOSCOPY (EGD) WITH PROPOFOL;  Surgeon: Wyline Mood, MD;  Location: [facility] ENDOSCOPY;  Service: Endoscopy;  Laterality: N/A;   ESOPHAGOGASTRODUODENOSCOPY (EGD) WITH PROPOFOL N/A 02/13/2020   Procedure: ESOPHAGOGASTRODUODENOSCOPY (EGD) WITH PROPOFOL;  Surgeon: Pasty Spillers, MD;  Location: [facility] ENDOSCOPY;  Service: Endoscopy;  Laterality: N/A;   KNEE ARTHROSCOPY WITH MEDIAL MENISECTOMY Right 09/05/2017   Procedure: KNEE ARTHROSCOPY WITH MEDIAL AND LATERAL  MENISECTOMY PARTIAL SYNOVECTOMY;  Surgeon: Kennedy Bucker, MD;  Location: [facility] ORS;  Service: Orthopedics;  Laterality: Right;   LEFT HEART CATH AND CORONARY ANGIOGRAPHY N/A 12/04/2019   Procedure: LEFT HEART CATH AND CORONARY ANGIOGRAPHY;  Surgeon: Tonny Bollman, MD;  Location: [facility] INVASIVE CV LAB;  Service: Cardiovascular;  Laterality: N/A;   LEFT HEART CATH AND CORONARY ANGIOGRAPHY Left 11/25/2022   Procedure: LEFT HEART CATH AND CORONARY ANGIOGRAPHY;  Surgeon: Laurier Nancy, MD;  Location: [facility] INVASIVE CV LAB;  Service: Cardiovascular;  Laterality: Left;   SHOULDER SURGERY Right 04/09/2012   SPINE SURGERY     TOTAL KNEE ARTHROPLASTY Right 01/19/2021   Procedure: TOTAL KNEE ARTHROPLASTY -  [place] Neighbor to Assist;  Surgeon: Kennedy Bucker, MD;  Location: [facility] ORS;  Service: Orthopedics;  Laterality: Right;   Social History   Occupational History   Occupation: disability   Occupation: stocking at gas station    Comment: 2 days per week  Tobacco Use   Smoking status: Some Days    Packs/day: 0.50    Years: 32.00    Additional pack years: 0.00  Total pack years: 16.00    Types: Cigarettes    Last attempt to quit: 10/25/2021    Years since quitting: 1.1    Passive exposure: Current   Smokeless tobacco: Never   Tobacco comments:    Continues to smoke approximately 5 cigarettes per day.  Has been 2 days since las cigarette.  Has not bought any more.  Using patches and gum.  5/23 hfb  Vaping Use   Vaping Use: Never used  Substance and Sexual Activity   Alcohol use: Yes    Alcohol/week: 6.0 standard drinks of alcohol    Types: 6 Cans of beer per week    Comment: occassionally    Drug use: Not Currently    Types: Cocaine, Marijuana    Comment: last on saturday   Sexual activity: Yes    Partners: Female    Birth control/protection: None

## 2022-12-29 ENCOUNTER — Encounter: Payer: Self-pay | Admitting: Neurology

## 2022-12-29 ENCOUNTER — Ambulatory Visit (INDEPENDENT_AMBULATORY_CARE_PROVIDER_SITE_OTHER): Payer: Medicaid Other | Admitting: Neurology

## 2022-12-29 VITALS — BP 100/61 | HR 53 | Ht 75.0 in | Wt 200.0 lb

## 2022-12-29 DIAGNOSIS — R2689 Other abnormalities of gait and mobility: Secondary | ICD-10-CM

## 2022-12-29 DIAGNOSIS — Z789 Other specified health status: Secondary | ICD-10-CM

## 2022-12-29 DIAGNOSIS — G251 Drug-induced tremor: Secondary | ICD-10-CM

## 2022-12-29 DIAGNOSIS — G250 Essential tremor: Secondary | ICD-10-CM

## 2022-12-29 DIAGNOSIS — Z820 Family history of epilepsy and other diseases of the nervous system: Secondary | ICD-10-CM

## 2022-12-29 NOTE — Progress Notes (Signed)
Subjective:    Patient ID: Keith Gibbs is a 62 y.o. male.  HPI    Keith Foley, MD, PhD [facility] [address], [address] [address] [place], [place] [address]  Dear Keith Gibbs,  I saw your patient, Keith Gibbs, upon your kind request of my neurologic clinic today for evaluation of his tremor.  The patient is accompanied by his mother today.  As you know, Mr. Keith Gibbs is is a 62 year old male with an underlying complex medical history of coronary artery disease with status post MI and status post angioplasty, history of stroke, grade 1 diastolic dysfunction,, COPD, diverticulitis, reflux disease, history of hepatitis C, hypertension, hyperlipidemia,history of substance use disorder, chronic pain, anxiety, depression, back pain, status post lumbar fusion in January 2024, arthritis with status post right total knee arthroplasty in 2022, who reports a longstanding history of hand tremors.  His tremors have become worse over time.  It is difficult for him to feed himself or drink something without spilling.  His handwriting is difficult to read.  His maternal grandfather had a tremor and also Parkinson's per mom and paternal great aunt also had tremors.  Mom does not have a tremor but incidentally, I advised we were talking, she was noted to have a head tremor which she reports she had never noticed.  Patient lives with his mom, his wife passed away some 3 years ago.  He does not drive a car.  He smokes cigarettes, about 15 cigarettes/day.  He has recently tried smoking marijuana to see if the tremor would improve but it did not help.  He drinks alcohol in the form of beer, about 1/day on average.  He has not fallen but has caught himself several times, his balance has not been good.  He reports that he does not drink a whole lot of water, mom estimates that he drinks up to 3 bottles of water per day, 16 ounce size.  He drinks quite a bit of caffeine in the form of coffee, mom says he  loves his coffee, he reports that he drinks about 2 cups in the morning and tea throughout the day, per mom, up to half a gallon. He reports no illicit drug use for the past couple of months.  I reviewed your office note from 10/19/2022.  Of note, I had evaluated him for tremors several years ago.  He was on multiple psychotropic medications at the time.  He is currently on several psychotropic medications including Depakote, Cymbalta, gabapentin, hydroxyzine and trazodone.  He had a recent head CT without contrast on 08/13/2022 with indication of altered mental status.  I reviewed the results: Impression: No acute intracranial abnormality.  He had admission to inpatient psych in January 2024 due to altered mental status, agitation, and intentional overdose on home medications.  UDS was positive for cocaine and benzodiazepine.  He had admission to the hospital in February 2024 for elective L2-L4 fusion.  Previously:  01/14/2014: 62 year old right-handed gentleman with an underlying medical history of hypertension, smoking, COPD, bipolar disorder, schizophrenia, anxiety, arthritis, hepatitis C, liver disease, and thyroid disease, who has noted a bilateral upper extremity tremor for the past 10 years or longer. He is not aware of any FHx of tremors. This affects his upper body, including head, neck and both arms and hands. Over the years, it has become worse and also fluctuates on a day to day basis. He is going to have a thyroid US soon. His caffeine intake is little,  usually one cup in the AM. He denies any alcohol use currently in the last 10 years and no illicit drugs in over 25 years. [facility]. He recently had thyroid function testing including thyroid antibodies with thyroid peroxidase and thyroglobulin antibody testing which was normal, free T4 was in the normal range and so was TSH, T4 and T3. This was done on 01/06/2014 and I reviewed the test results. I  also reviewed blood work from 10/31/2013 which included a CMP, CBC without differential, lipid panel, iron and TIBC, vitamin B12 and folate, TSH, all of which were in the normal range with the exception of AST of 65 and ALT of 56. He has a long-standing history of psychiatric illness including voluntary and involuntary admissions. He is a recovering alcoholic. He has a remote history of substance abuse. He is currently on Abilify, Atarax, benadryl, lamictal, risperdal. He took Jordan in the past, which caused severe shaking. He was on it about a year or 2 ago. He does not think the tremor is from any of his medications, as he had the tremor been there before.  He used to work as a Tourist information centre manager for 30 years and is now on disability. His Past Medical History Is Significant For: Past Medical History:  Diagnosis Date   Anginal pain (HCC)    Anxiety    Arthritis    Bipolar 1 disorder (HCC)    Bursitis    CAD (coronary artery disease)    Chronic pain    COPD (chronic obstructive pulmonary disease) (HCC)    Current use of long term anticoagulation    DAPT (ASA + clopidogrel)   Depression    Diverticulitis    Dyspnea    GERD (gastroesophageal reflux disease)    Grade I diastolic dysfunction    Hepatitis C 2012   No longer has Hep C   HLD (hyperlipidemia)    Hypertension    MI (myocardial infarction) (HCC)    Polysubstance abuse (HCC)    cocaine, marijuana, ETOH   PUD (peptic ulcer disease)    S/P angioplasty with stent 06/10/2016   a.) 90% stenosis of pLAD to mLAD - 2.5 x 18 mm Xience Alpine (DES x 1) placed to pLAD   S/P PTCA (percutaneous transluminal coronary angioplasty) 12/04/2019   a.) 60% in stent restenosis of DES to pLAD; LVEF 65%.   Schizophrenia (HCC)    Stroke [facility])    Valvular insufficiency    a.) Mild MR, TR, PR; mild to moderate AR on 03/05/2018 TTE    His Past Surgical History Is Significant For: Past Surgical History:  Procedure Laterality Date    ABDOMINAL SURGERY     removed small piece of intestines due to [facility] Diverticulosis   ANTERIOR LAT LUMBAR FUSION N/A 08/23/2022   Procedure: DIRECT LATERAL INTERBODY FUSION  LUMBAR TWO- LUMBAR THREE, LUMBAR THREE-LUMBAR FOUR , EXPLORE AND EXTEND FUSION LUMBAR TWO-LUMBAR FIVE, POSTERIOR DECOMPRESSION LUMBAR THREE-LUMBAR FOUR, LEFT LUMBAR TWO-LUMBAR THREE;  Surgeon: Bedelia Person, MD;  Location: [facility] OR;  Service: Neurosurgery;  Laterality: N/A;   APPENDECTOMY     BACK SURGERY     CARDIAC CATHETERIZATION Left 06/10/2016   Procedure: Left Heart Cath and Coronary Angiography;  Surgeon: Laurier Nancy, MD;  Location: [facility] INVASIVE CV LAB;  Service: Cardiovascular;  Laterality: Left;   CARDIAC CATHETERIZATION N/A 06/10/2016   Procedure: Coronary Stent Intervention;  Surgeon: Alwyn Pea, MD;  Location: [facility] INVASIVE CV  LAB;  Service: Cardiovascular;  Laterality: N/A;   CHOLECYSTECTOMY N/A 02/17/2022   Procedure: LAPAROSCOPIC CHOLECYSTECTOMY;  Surgeon: Sheliah Hatch De Blanch, MD;  Location: [facility] OR;  Service: General;  Laterality: N/A;   COLON SURGERY     COLONOSCOPY     COLONOSCOPY WITH PROPOFOL N/A 01/05/2017   Procedure: COLONOSCOPY WITH PROPOFOL;  Surgeon: Wyline Mood, MD;  Location: [facility] ENDOSCOPY;  Service: Endoscopy;  Laterality: N/A;   COLONOSCOPY WITH PROPOFOL N/A 02/13/2020   Procedure: COLONOSCOPY WITH PROPOFOL;  Surgeon: Pasty Spillers, MD;  Location: [facility] ENDOSCOPY;  Service: Endoscopy;  Laterality: N/A;   CORONARY ANGIOPLASTY WITH STENT PLACEMENT     CORONARY PRESSURE/FFR STUDY N/A 12/04/2019   Procedure: INTRAVASCULAR PRESSURE WIRE/FFR STUDY;  Surgeon: Tonny Bollman, MD;  Location: [facility] INVASIVE CV LAB;  Service: Cardiovascular;  Laterality: N/A;   ESOPHAGOGASTRODUODENOSCOPY (EGD) WITH PROPOFOL N/A 01/05/2017   Procedure: ESOPHAGOGASTRODUODENOSCOPY (EGD) WITH PROPOFOL;  Surgeon: Wyline Mood, MD;  Location: [facility] ENDOSCOPY;  Service: Endoscopy;  Laterality: N/A;    ESOPHAGOGASTRODUODENOSCOPY (EGD) WITH PROPOFOL N/A 02/13/2020   Procedure: ESOPHAGOGASTRODUODENOSCOPY (EGD) WITH PROPOFOL;  Surgeon: Pasty Spillers, MD;  Location: [facility] ENDOSCOPY;  Service: Endoscopy;  Laterality: N/A;   KNEE ARTHROSCOPY WITH MEDIAL MENISECTOMY Right 09/05/2017   Procedure: KNEE ARTHROSCOPY WITH MEDIAL AND LATERAL  MENISECTOMY PARTIAL SYNOVECTOMY;  Surgeon: Kennedy Bucker, MD;  Location: [facility] ORS;  Service: Orthopedics;  Laterality: Right;   LEFT HEART CATH AND CORONARY ANGIOGRAPHY N/A 12/04/2019   Procedure: LEFT HEART CATH AND CORONARY ANGIOGRAPHY;  Surgeon: Tonny Bollman, MD;  Location: [facility] INVASIVE CV LAB;  Service: Cardiovascular;  Laterality: N/A;   LEFT HEART CATH AND CORONARY ANGIOGRAPHY Left 11/25/2022   Procedure: LEFT HEART CATH AND CORONARY ANGIOGRAPHY;  Surgeon: Laurier Nancy, MD;  Location: [facility] INVASIVE CV LAB;  Service: Cardiovascular;  Laterality: Left;   SHOULDER SURGERY Right 04/09/2012   SPINE SURGERY     TOTAL KNEE ARTHROPLASTY Right 01/19/2021   Procedure: TOTAL KNEE ARTHROPLASTY - [place] Neighbor to Assist;  Surgeon: Kennedy Bucker, MD;  Location: [facility] ORS;  Service: Orthopedics;  Laterality: Right;    His Family History Is Significant For: Family History  Problem Relation Age of Onset   Osteoarthritis Mother    Heart disease Mother    Hypertension Mother    Depression Mother    Heart disease Father    Early death Father    Hypertension Father    Heart attack Father    Hypertension Sister    Parkinson's disease Maternal Grandfather    Prostate cancer Neg Hx    Bladder Cancer Neg Hx    Kidney cancer Neg Hx    Tremor Neg Hx     His Social History Is Significant For: Social History   Socioeconomic History   Marital status: Widowed    Spouse name: Not on file   Number of children: Not on file   Years of education: Not on file   Highest education level: Not on file  Occupational History   Occupation: disability   Occupation: stocking at  gas station    Comment: 2 days per week  Tobacco Use   Smoking status: Every Day    Packs/day: 0.50    Years: 32.00    Additional pack years: 0.00    Total pack years: 16.00    Types: Cigarettes    Last attempt to quit: 10/25/2021    Years since quitting: 1.1    Passive exposure: Current   Smokeless tobacco: Never   Tobacco  comments:    Continues to smoke approximately 5 cigarettes per day.  Has been 2 days since las cigarette.  Has not bought any more.  Using patches and gum.  5/23 hfb  Vaping Use   Vaping Use: Never used  Substance and Sexual Activity   Alcohol use: Yes    Alcohol/week: 3.0 standard drinks of alcohol    Types: 3 Cans of beer per week    Comment: occassionally    Drug use: Not Currently    Types: Cocaine, Marijuana    Comment: last on saturday   Sexual activity: Yes    Partners: Female    Birth control/protection: None  Other Topics Concern   Not on file  Social History Narrative   Not on file   Social Determinants of Health   Financial Resource Strain: Not on file  Food Insecurity: No Food Insecurity (08/23/2022)   Hunger Vital Sign    Worried About Running Out of Food in the Last Year: Never true    Ran Out of Food in the Last Year: Never true  Transportation Needs: No Transportation Needs (08/23/2022)   PRAPARE - Administrator, Civil Service (Medical): No    Lack of Transportation (Non-Medical): No  Physical Activity: Not on file  Stress: Not on file  Social Connections: Not on file    His Allergies Are:  Allergies  Allergen Reactions   Asenapine Other (See Comments) and Nausea And Vomiting    Increased tremors   Latuda [Lurasidone Hcl] Other (See Comments)    Tremors     Lurasidone Other (See Comments)  :   His Current Medications Are:  Outpatient Encounter Medications as of 12/29/2022  Medication Sig   albuterol (VENTOLIN HFA) 108 (90 Base) MCG/ACT inhaler Inhale 2 puffs into the lungs every 6 (six) hours as needed for  wheezing or shortness of breath.   aspirin EC 81 MG tablet Take 1 tablet (81 mg total) by mouth daily. Swallow whole.   atorvastatin (LIPITOR) 80 MG tablet TAKE 1 TABLET BY MOUTH EVERY DAY   Budeson-Glycopyrrol-Formoterol (BREZTRI AEROSPHERE) 160-9-4.8 MCG/ACT AERO Inhale 2 puffs into the lungs in the morning and at bedtime.   cyclobenzaprine (FLEXERIL) 10 MG tablet Take 1 tablet (10 mg total) by mouth 3 (three) times daily as needed for muscle spasms.   diclofenac Sodium (VOLTAREN) 1 % GEL Apply 2 g topically 4 (four) times daily.   divalproex (DEPAKOTE) 500 MG DR tablet Take 1 tablet (500 mg total) by mouth daily.   divalproex (DEPAKOTE) 500 MG DR tablet Take 2 tablets (1,000 mg total) by mouth at bedtime.   DULoxetine (CYMBALTA) 30 MG capsule Take 3 capsules (90 mg total) by mouth daily.   gabapentin (NEURONTIN) 600 MG tablet Take 1 tablet (600 mg total) by mouth 2 (two) times daily.   hydrOXYzine (ATARAX) 25 MG tablet Take 1 tablet (25 mg total) by mouth 3 (three) times daily as needed for anxiety.   nitroGLYCERIN (NITROSTAT) 0.4 MG SL tablet Place 1 tablet (0.4 mg total) under the tongue every 5 (five) minutes as needed for chest pain.   traZODone (DESYREL) 100 MG tablet Take 1 tablet (100 mg total) by mouth at bedtime as needed for sleep.   No facility-administered encounter medications on file as of 12/29/2022.  :   Review of Systems:  Out of a complete 14 point review of systems, all are reviewed and negative with the exception of these symptoms as listed below:   Review  of Systems  Neurological:        Pt here for tremors  Pt states both hands and feet have tremors Pt states some head tremors       Objective:  Neurological Exam  Physical Exam Physical Examination:   Vitals:   12/29/22 0822  BP: 100/61  Pulse: (!) 53    General Examination: The patient is a very pleasant 62 y.o. male in no acute distress. He appears deconditioned.  Adequately groomed.  HEENT:  Normocephalic, atraumatic, pupils are equal, round and reactive to light, extraocular tracking is good without limitation to gaze excursion or nystagmus noted.  Corrective eyeglasses in place, hearing is impaired, no hearing aids.  Speech is edentulous, otherwise no voice tremor.  Neck with full range of motion, no carotid bruits.  Oropharynx exam reveals: moderate mouth dryness, edentulous.  Tongue protrudes centrally and palate elevates symmetrically.  No orofacial dyskinesias.  No head tremor or jaw tremor noted.  Chest: Clear to auscultation without wheezing, rhonchi or crackles noted.  Heart: S1+S2+0, regular and normal without murmurs, rubs or gallops noted.  Bradycardia noted.  Abdomen: Soft, non-tender and non-distended.  Extremities: There is no pitting edema in the distal lower extremities bilaterally.   Skin: Warm and dry without trophic changes noted.   Musculoskeletal: exam reveals no obvious joint deformities.  Unremarkable scar from right knee replacement.  Neurologically:  Mental status: The patient is awake, alert and oriented in all 4 spheres. His immediate and remote memory, attention, language skills and fund of knowledge are appropriate. Mood is constricted and affect is blunted.  Cranial nerves II - XII are as described above under HEENT exam.  Motor exam: Normal bulk, global strength normal, normal tone noted, no significant resting tremor.  He does have a bilateral postural tremor and action tremor in the upper extremities, no lower extremity tremor.  Handwriting is tremulous but legible, not micrographic.  Archimedes spiral drawing shows trembling bilaterally.   Fine motor skills and coordination: He has impaired fine motor skills bilaterally, no lateralization, no actual decrement in amplitude.    Cerebellar testing: No dysmetria or intention tremor. There is no truncal or gait ataxia.  Heel-to-shin mildly difficult but no dysmetria. Reflexes are diminished throughout.   Toes are downgoing bilaterally. Sensory exam: intact to light touch in the upper and lower extremities.  Gait, station and balance: He stands with mild difficulty, pushes himself up, posture is age-appropriate to mildly stooped for age.  He walks slightly wider base, with mild unsteadiness, no walking aid, no shuffling, has preserved arm swing.  Assessment and Plan:  In summary, NAKARI DELEO is a 62 year old male with an underlying complex medical history of coronary artery disease with status post MI and status post angioplasty, history of stroke, grade 1 diastolic dysfunction,, COPD, diverticulitis, reflux disease, history of hepatitis C, hypertension, hyperlipidemia,history of substance use disorder, chronic pain, anxiety, depression, back pain, status post lumbar fusion in January 2024, arthritis with status post right total knee arthroplasty in 2022, who presents for evaluation of his hand tremors of several years duration, with progression noted.  He also reports a family history of tremors.  History and examination are supportive of essential tremor, likely exacerbated by his stress, caffeine, certain medications including Depakote and Cymbalta, and tendency to suboptimally hydrate with water.  I had a long discussion with the patient and his mom today.  Unfortunately, there is not a whole lot from the medication standpoint I can offer him.  He is  not a candidate for beta-blocker due to lower blood pressure value and low heart rate, in addition, a beta-blocker may exacerbate his depression and his COPD.  I would not recommend Topamax for tremor control off label as it may affect his underlying psychiatric condition adversely.  I would not recommend a trial of Mysoline because of his concern about his balance and mild gait unsteadiness noted.  I would not recommend off label use of a benzodiazepine, which is not considered a good long-term medication for tremors in general and given his substance use  disorder, it is not a good choice for him in particular.  He is advised to work on smoking cessation, he is advised to completely avoid drinking alcohol as it may not combine well with his current medication regimen.  He is furthermore advised to reduce his caffeine intake and limit himself to about 2 servings per day.  He is encouraged to stay better hydrated with water and try to drink about 4 bottles of water, 16.9 ounce size each.  We talked about DBS, I explained this treatment option to the patient and his mom.  He would like to pursue consultation.  I told him that I cannot promise that he is a good candidate for DBS but we can certainly seek consultation.  To that end, I will refer him to Dr. Arbutus Leas at [facility] neurology for evaluation for DBS for essential tremor.  He was given detailed written instructions in print form as he does not use his electronic chart access.  At this juncture, he is advised to follow-up in this clinic as needed.   I answered all the questions today and the patient and his mom were in agreement.   This was an extended visit of over 60 minutes with copious record review involved. Thank you very much for allowing me to participate in the care of this nice patient. If I can be of any further assistance to you please do not hesitate to call me at [phone].  Sincerely,   Keith Foley, MD, PhD

## 2022-12-29 NOTE — Patient Instructions (Addendum)
Unfortunately, there are not many options for tremor control in your case.  Your tremor may be exacerbated by excessive caffeine use.  It may also be exacerbated by certain medications.    Please reduce your caffeine consumption and limit yourself to 2 servings per day.   I would recommend you stay completely away from drinking alcohol and avoid smoking marijuana or any other illicit drugs.   Please try to hydrate well with water, about 4 bottles of water per day, 16.9 ounce size each. You are not a candidate for a beta-blocker for tremor control due to lower normal blood pressure and low heart rate. I do not believe you are a candidate for Mysoline which is another medication we use for tremor control because of your balance issues. I would not recommend off label use of Topamax for tremor control because it can affect your mood disorder. We can consider evaluation for deep brain stimulation surgery for tremor treatment, I will make a referral to Dr. Arbutus Leas at [facility] neurology for consultation.

## 2023-01-04 ENCOUNTER — Encounter: Payer: Self-pay | Admitting: Neurology

## 2023-01-04 ENCOUNTER — Telehealth: Payer: Self-pay | Admitting: Orthopedic Surgery

## 2023-01-04 NOTE — Telephone Encounter (Signed)
I do not think this is a good idea in a patient who has a history of multiple drug addictions and prior suicidal attempts with drug overdose. I would try taking Tylenol and motrin alternating with ice and heat and see how that goes

## 2023-01-04 NOTE — Telephone Encounter (Signed)
Pt called requesting pain medication. Pt has MRI coming up but states he is in severe pains. Pt states he use to take oxycodone 10 mg. Please send to [facility] on [address]. Pt phone number is [phone]

## 2023-01-04 NOTE — Telephone Encounter (Signed)
Patient unhappy but aware of the below message

## 2023-01-06 ENCOUNTER — Encounter (HOSPITAL_COMMUNITY): Payer: Self-pay | Admitting: Student in an Organized Health Care Education/Training Program

## 2023-01-06 ENCOUNTER — Ambulatory Visit (INDEPENDENT_AMBULATORY_CARE_PROVIDER_SITE_OTHER): Payer: Medicaid Other | Admitting: Student in an Organized Health Care Education/Training Program

## 2023-01-06 DIAGNOSIS — F250 Schizoaffective disorder, bipolar type: Secondary | ICD-10-CM | POA: Diagnosis not present

## 2023-01-06 DIAGNOSIS — F3170 Bipolar disorder, currently in remission, most recent episode unspecified: Secondary | ICD-10-CM

## 2023-01-06 MED ORDER — DIVALPROEX SODIUM 500 MG PO DR TAB
1000.0000 mg | DELAYED_RELEASE_TABLET | Freq: Every day | ORAL | 2 refills | Status: DC
Start: 2023-01-06 — End: 2023-01-11

## 2023-01-06 MED ORDER — HYDROXYZINE HCL 25 MG PO TABS
25.0000 mg | ORAL_TABLET | Freq: Three times a day (TID) | ORAL | 3 refills | Status: DC | PRN
Start: 2023-01-06 — End: 2023-02-03

## 2023-01-06 MED ORDER — DIVALPROEX SODIUM 500 MG PO DR TAB
500.0000 mg | DELAYED_RELEASE_TABLET | Freq: Every day | ORAL | 3 refills | Status: DC
Start: 2023-01-06 — End: 2023-01-11

## 2023-01-06 MED ORDER — GABAPENTIN 600 MG PO TABS: 600.0000 mg | ORAL_TABLET | Freq: Two times a day (BID) | ORAL | 2 refills | Status: DC

## 2023-01-06 MED ORDER — DULOXETINE HCL 30 MG PO CPEP
90.0000 mg | ORAL_CAPSULE | Freq: Every day | ORAL | 3 refills | Status: DC
Start: 2023-01-06 — End: 2023-02-03

## 2023-01-06 MED ORDER — QUETIAPINE FUMARATE 100 MG PO TABS
100.0000 mg | ORAL_TABLET | Freq: Every day | ORAL | 1 refills | Status: DC
Start: 2023-01-06 — End: 2023-02-03
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
## 2023-01-06 NOTE — Patient Instructions (Signed)
   [facility]: [email or web address]

## 2023-01-09 ENCOUNTER — Other Ambulatory Visit (HOSPITAL_COMMUNITY): Payer: Self-pay | Admitting: Student in an Organized Health Care Education/Training Program

## 2023-01-09 DIAGNOSIS — Z5181 Encounter for therapeutic drug level monitoring: Secondary | ICD-10-CM

## 2023-01-09 DIAGNOSIS — J431 Panlobular emphysema: Secondary | ICD-10-CM

## 2023-01-11 ENCOUNTER — Other Ambulatory Visit (HOSPITAL_COMMUNITY): Payer: Self-pay | Admitting: Student in an Organized Health Care Education/Training Program

## 2023-01-11 ENCOUNTER — Telehealth (HOSPITAL_COMMUNITY): Payer: Self-pay | Admitting: *Deleted

## 2023-01-11 ENCOUNTER — Other Ambulatory Visit (HOSPITAL_COMMUNITY): Payer: Self-pay

## 2023-01-11 DIAGNOSIS — F3170 Bipolar disorder, currently in remission, most recent episode unspecified: Secondary | ICD-10-CM

## 2023-01-11 MED ORDER — DIVALPROEX SODIUM 500 MG PO DR TAB: 1500.0000 mg | DELAYED_RELEASE_TABLET | Freq: Every day | ORAL | 2 refills | Status: DC

## 2023-01-11 NOTE — Telephone Encounter (Signed)
Patient called asking if his Depakote can be written as one prescription because his insurance will not pay for 2 separate prescriptions.

## 2023-01-12 LAB — COMPREHENSIVE METABOLIC PANEL
ALT: 12 IU/L (ref 0–44)
AST: 13 IU/L (ref 0–40)
Albumin: 3.9 g/dL (ref 3.9–4.9)
Alkaline Phosphatase: 63 IU/L (ref 44–121)
BUN/Creatinine Ratio: 9 — ABNORMAL LOW (ref 10–24)
BUN: 7 mg/dL — ABNORMAL LOW (ref 8–27)
Bilirubin Total: 0.4 mg/dL (ref 0.0–1.2)
CO2: 18 mmol/L — ABNORMAL LOW (ref 20–29)
Calcium: 8.9 mg/dL (ref 8.6–10.2)
Chloride: 101 mmol/L (ref 96–106)
Creatinine, Ser: 0.78 mg/dL (ref 0.76–1.27)
Globulin, Total: 2.3 g/dL (ref 1.5–4.5)
Glucose: 102 mg/dL — ABNORMAL HIGH (ref 70–99)
Potassium: 4.4 mmol/L (ref 3.5–5.2)
Sodium: 136 mmol/L (ref 134–144)
Total Protein: 6.2 g/dL (ref 6.0–8.5)
eGFR: 101 mL/min/{1.73_m2} (ref >59)

## 2023-01-12 LAB — VALPROIC ACID LEVEL: Valproic Acid Lvl: 47 ug/mL — ABNORMAL LOW (ref 50–100)

## 2023-01-16 ENCOUNTER — Ambulatory Visit
Admission: RE | Admit: 2023-01-16 | Discharge: 2023-01-16 | Disposition: A | Discharge status: Home / Self Care | Payer: Medicaid Other | Source: Ambulatory Visit | Attending: Orthopedic Surgery | Admitting: Orthopedic Surgery

## 2023-01-16 DIAGNOSIS — M79601 Pain in right arm: Secondary | ICD-10-CM

## 2023-01-16 MED ORDER — IOPAMIDOL (ISOVUE-M 200) INJECTION 41%
15.0000 mL | Freq: Once | INTRAMUSCULAR | Status: AC
  Administered 2023-01-16: 15 mL via INTRA_ARTICULAR

## 2023-01-18 ENCOUNTER — Ambulatory Visit (INDEPENDENT_AMBULATORY_CARE_PROVIDER_SITE_OTHER): Payer: Medicaid Other | Admitting: Orthopedic Surgery

## 2023-01-18 ENCOUNTER — Encounter: Payer: Self-pay | Admitting: Orthopedic Surgery

## 2023-01-18 ENCOUNTER — Other Ambulatory Visit: Payer: Self-pay

## 2023-01-18 DIAGNOSIS — M19011 Primary osteoarthritis, right shoulder: Secondary | ICD-10-CM

## 2023-01-18 DIAGNOSIS — M79601 Pain in right arm: Secondary | ICD-10-CM | POA: Diagnosis not present

## 2023-01-18 NOTE — Progress Notes (Unsigned)
Office Visit Note   Patient: Keith Gibbs           Date of Birth: 05-14-1961           MRN: [id number] Visit Date: 01/18/2023 Requested by: Rema Fendt, NP [address] [place],  [place] [address] PCP: Rema Fendt, NP  Subjective: Chief Complaint  Patient presents with   Other     Scan review    HPI: Keith Gibbs is a 62 y.o. male who presents to the office reporting right shoulder pain.  Had surgery 10 years ago.  Hard to grab things up off the table.  Since he was last seen MRI scan has been performed which is reviewed.  Some subscapularis muscle atrophy is noted.  Biceps tenodesis also performed.  Probable small recurrent full-thickness tear of the subscap tendon allowing contrast to extend out into the subacromial space.  Moderate AC joint degenerative changes also noted..                ROS: All systems reviewed are negative as they relate to the chief complaint within the history of present illness.  Patient denies fevers or chills.  Assessment & Plan: Visit Diagnoses:  1. Right arm pain     Plan: Impression is right shoulder pain with no clearly definable operative problem.  His subscap strength is actually pretty reasonable.  Range of motion also good.  We will try glenohumeral joint injection today.  Follow-up in 6 to 8 weeks if there is no improvement.  Follow-Up Instructions: No follow-ups on file.   Orders:  Orders Placed This Encounter  Procedures   US Guided Needle Placement - No Linked Charges   No orders of the defined types were placed in this encounter.     Procedures: Large Joint Inj: R glenohumeral on 01/18/2023 9:54 PM Indications: diagnostic evaluation and pain Details: 22 G 1.5 in needle, ultrasound-guided posterior approach  Arthrogram: No  Medications: 9 mL bupivacaine 0.5 %; 40 mg methylPREDNISolone acetate 40 MG/ML; 5 mL lidocaine 1 % Outcome: tolerated well, no immediate complications Procedure, treatment  alternatives, risks and benefits explained, specific risks discussed. Consent was given by the patient. Immediately prior to procedure a time out was called to verify the correct patient, procedure, equipment, support staff and site/side marked as required. Patient was prepped and draped in the usual sterile fashion.       Clinical Data: No additional findings.  Objective: Vital Signs: There were no vitals taken for this visit.  Physical Exam:  Constitutional: Patient appears well-developed HEENT:  Head: Normocephalic Eyes:EOM are normal Neck: Normal range of motion Cardiovascular: Normal rate Pulmonary/chest: Effort normal Neurologic: Patient is alert Skin: Skin is warm Psychiatric: Patient has normal mood and affect  Ortho Exam: Ortho exam demonstrates passive range of motion on that right shoulder of 70/100/170.  Has 5 out of 5 subscap strength 5+ out of 5 external rotation strength.  He does have a little bit of coarseness and popping in the shoulder more consistent with occult glenohumeral arthritis.  I did review the scan and he does have some areas of chondral irregularity on the humeral head and the glenoid.  No discrete AC joint tenderness right versus left cervical spine range of motion intact deltoid fires.  Specialty Comments:  No specialty comments available.  Imaging: No results found.   PMFS History: Patient Active Problem List   Diagnosis Date Noted   S/P lumbar fusion 08/23/2022   Postlaminectomy  syndrome, not elsewhere classified 08/12/2022   MDD (major depressive disorder) 08/10/2022   Substance induced mood disorder (HCC) 08/06/2022   Suicide attempt by drug overdose (HCC) 08/06/2022   Toxic encephalopathy 08/03/2022   Suicidal ideation 08/03/2022   Encephalopathy 08/03/2022   Polysubstance abuse (HCC) 04/28/2022   Postoperative abdominal pain 02/28/2022   Acute cholecystitis 02/17/2022   Obstruction of nasal valve 01/07/2022   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    Pulmonary nodule 12/17/2021   CAD S/P percutaneous coronary angioplasty 12/17/2021   Epigastric abdominal pain    Nausea and vomiting    Tobacco dependence due to cigarettes 08/31/2021   Iron deficiency anemia secondary to inadequate dietary iron intake 08/31/2021   Acute lead-induced gout involving toe of left foot 08/31/2021   Encounter for lipid screening for cardiovascular disease 08/31/2021   Hypotension due to drugs 06/21/2021   Opioid withdrawal (HCC) 06/21/2021   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HCC) 06/21/2021   RLS (restless legs syndrome) 05/07/2021   Acquired deviated nasal septum 05/03/2021   Encounter for surveillance of abnormal nevi 05/03/2021   Flu vaccine need 05/03/2021   Substance abuse (HCC) 05/03/2021   Drug abuse and dependence (HCC) 05/03/2021   Need for shingles vaccine 05/03/2021   Right groin pain 01/29/2021   S/P TKR (total knee replacement) using cement, right 01/19/2021   Cigarette smoker 07/21/2020   Foot pain, bilateral 12/25/2019   Potential exposure to STD 12/12/2019   History of lumbar surgery 12/12/2019   Fatigue 12/12/2019   Penile lesion 12/12/2019   Right testicular pain 12/12/2019   Body mass index 28.0-28.9, adult 12/12/2019   History of COPD 12/12/2019   Tobacco use disorder, continuous 12/12/2019   Hepatoma (HCC) 11/05/2019   Anxiety 12/10/2018   Essential hypertension 09/18/2018   Hypercholesteremia 09/18/2018   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   Chronic anticoagulation (Plavix) 04/19/2018   Pain medication agreement broken ([facility]) 04/19/2018   Cocaine use 04/18/2018   Cocaine abuse (HCC) (See 04/12/18 UDS) 04/18/2018   Marijuana abuse (See 04/12/18 UDS) 04/18/2018   Long term current use of opiate analgesic 04/12/2018   Cirrhosis of liver (HCC) 04/03/2018   BPH (benign prostatic hyperplasia) 04/03/2018   Hematuria, microscopic 04/03/2018   Osteoarthritis of the knee (Left) 11/02/2017   Lumbar facet syndrome (Bilateral) 11/01/2017    Spondylosis without myelopathy or radiculopathy, lumbosacral region 11/01/2017   Osteoarthritis of shoulder (Right) 11/01/2017   Osteoarthritis of acromioclavicular joint (Right) 11/01/2017   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) 11/01/2017   Osteoarthritis 11/01/2017   Cervicalgia 11/01/2017   Cervical facet syndrome 11/01/2017   DDD (degenerative disc disease), cervical 11/01/2017   DDD (degenerative disc disease), lumbar 11/01/2017   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) 09/18/2017   Other chronic pain 09/18/2017   Chronic musculoskeletal pain 09/18/2017   Chronic pain of right knee 09/11/2017   Chronic low back pain (Secondary Area of Pain) (Bilateral) 09/11/2017   Chronic hip pain (Tertiary Area of Pain) (Bilateral) (R>L) 09/11/2017   Chronic shoulder pain (Fourth Area of Pain) (Right) 09/11/2017   Spondylosis without myelopathy or radiculopathy, cervical region 09/11/2017   Chronic pain syndrome 09/11/2017   Opiate use 09/11/2017   Screen for STD (sexually transmitted disease) 09/11/2017   Disorder of skeletal system 09/11/2017   Problems influencing health status 09/11/2017   Overdose of medication, intentional self-harm, sequela (HCC) 12/28/2016   Severe recurrent major depression without psychotic features (HCC) 12/27/2016   Alcohol use disorder, moderate, dependence (HCC) 12/27/2016   Cannabis use disorder, moderate, dependence (  HCC) 12/27/2016   Chest pain 06/10/2016   Bipolar 1 disorder (HCC) 05/27/2013   Myalgia 05/27/2013   Abnormal ejaculation 11/30/2012   Chest pain, unspecified 11/30/2012   Degenerative arthritis of hip 11/16/2012   Schizoaffective disorder (HCC) 06/27/2012   COPD (chronic obstructive pulmonary disease) (HCC) 06/27/2012   Chronic hepatitis C (HCC) 06/27/2012   GERD (gastroesophageal reflux disease) 06/27/2012   Complete rupture of rotator cuff 02/10/2012   Cocaine abuse in remission (HCC) 12/05/2011   Unspecified osteoarthritis,  unspecified site 07/27/2011   Past Medical History:  Diagnosis Date   Anginal pain (HCC)    Anxiety    Arthritis    Bipolar 1 disorder (HCC)    Bursitis    CAD (coronary artery disease)    Chronic pain    COPD (chronic obstructive pulmonary disease) (HCC)    Current use of long term anticoagulation    DAPT (ASA + clopidogrel)   Depression    Diverticulitis    Dyspnea    GERD (gastroesophageal reflux disease)    Grade I diastolic dysfunction    Hepatitis C 2012   No longer has Hep C   HLD (hyperlipidemia)    Hypertension    MI (myocardial infarction) (HCC)    Polysubstance abuse (HCC)    cocaine, marijuana, ETOH   PUD (peptic ulcer disease)    S/P angioplasty with stent 06/10/2016   a.) 90% stenosis of pLAD to mLAD - 2.5 x 18 mm Xience Alpine (DES x 1) placed to pLAD   S/P PTCA (percutaneous transluminal coronary angioplasty) 12/04/2019   a.) 60% in stent restenosis of DES to pLAD; LVEF 65%.   Schizophrenia (HCC)    Stroke [facility])    Valvular insufficiency    a.) Mild MR, TR, PR; mild to moderate AR on 03/05/2018 TTE    Family History  Problem Relation Age of Onset   Osteoarthritis Mother    Heart disease Mother    Hypertension Mother    Depression Mother    Heart disease Father    Early death Father    Hypertension Father    Heart attack Father    Hypertension Sister    Parkinson's disease Maternal Grandfather    Prostate cancer Neg Hx    Bladder Cancer Neg Hx    Kidney cancer Neg Hx    Tremor Neg Hx     Past Surgical History:  Procedure Laterality Date   ABDOMINAL SURGERY     removed small piece of intestines due to [facility] Diverticulosis   ANTERIOR LAT LUMBAR FUSION N/A 08/23/2022   Procedure: DIRECT LATERAL INTERBODY FUSION  LUMBAR TWO- LUMBAR THREE, LUMBAR THREE-LUMBAR FOUR , EXPLORE AND EXTEND FUSION LUMBAR TWO-LUMBAR FIVE, POSTERIOR DECOMPRESSION LUMBAR THREE-LUMBAR FOUR, LEFT LUMBAR TWO-LUMBAR THREE;  Surgeon: Bedelia Person, MD;  Location: [facility] OR;   Service: Neurosurgery;  Laterality: N/A;   APPENDECTOMY     BACK SURGERY     CARDIAC CATHETERIZATION Left 06/10/2016   Procedure: Left Heart Cath and Coronary Angiography;  Surgeon: Laurier Nancy, MD;  Location: [facility] INVASIVE CV LAB;  Service: Cardiovascular;  Laterality: Left;   CARDIAC CATHETERIZATION N/A 06/10/2016   Procedure: Coronary Stent Intervention;  Surgeon: Alwyn Pea, MD;  Location: [facility] INVASIVE CV LAB;  Service: Cardiovascular;  Laterality: N/A;   CHOLECYSTECTOMY N/A 02/17/2022   Procedure: LAPAROSCOPIC CHOLECYSTECTOMY;  Surgeon: Sheliah Hatch De Blanch, MD;  Location: [facility] OR;  Service: General;  Laterality: N/A;   COLON SURGERY     COLONOSCOPY  COLONOSCOPY WITH PROPOFOL N/A 01/05/2017   Procedure: COLONOSCOPY WITH PROPOFOL;  Surgeon: Wyline Mood, MD;  Location: [facility] ENDOSCOPY;  Service: Endoscopy;  Laterality: N/A;   COLONOSCOPY WITH PROPOFOL N/A 02/13/2020   Procedure: COLONOSCOPY WITH PROPOFOL;  Surgeon: Pasty Spillers, MD;  Location: [facility] ENDOSCOPY;  Service: Endoscopy;  Laterality: N/A;   CORONARY ANGIOPLASTY WITH STENT PLACEMENT     CORONARY PRESSURE/FFR STUDY N/A 12/04/2019   Procedure: INTRAVASCULAR PRESSURE WIRE/FFR STUDY;  Surgeon: Tonny Bollman, MD;  Location: [facility] INVASIVE CV LAB;  Service: Cardiovascular;  Laterality: N/A;   ESOPHAGOGASTRODUODENOSCOPY (EGD) WITH PROPOFOL N/A 01/05/2017   Procedure: ESOPHAGOGASTRODUODENOSCOPY (EGD) WITH PROPOFOL;  Surgeon: Wyline Mood, MD;  Location[facility] ENDOSCOPY;  Service: Endoscopy;  Laterality: N/A;   ESOPHAGOGASTRODUODENOSCOPY (EGD) WITH PROPOFOL N/A 02/13/2020   Procedure: ESOPHAGOGASTRODUODENOSCOPY (EGD) WITH PROPOFOL;  Surgeon: Pasty Spillers, MD;  Location: [facility] ENDOSCOPY;  Service: Endoscopy;  Laterality: N/A;   KNEE ARTHROSCOPY WITH MEDIAL MENISECTOMY Right 09/05/2017   Procedure: KNEE ARTHROSCOPY WITH MEDIAL AND LATERAL  MENISECTOMY PARTIAL SYNOVECTOMY;  Surgeon: Kennedy Bucker, MD;  Location: [facility] ORS;   Service: Orthopedics;  Laterality: Right;   LEFT HEART CATH AND CORONARY ANGIOGRAPHY N/A 12/04/2019   Procedure: LEFT HEART CATH AND CORONARY ANGIOGRAPHY;  Surgeon: Tonny Bollman, MD;  Location: [facility] INVASIVE CV LAB;  Service: Cardiovascular;  Laterality: N/A;   LEFT HEART CATH AND CORONARY ANGIOGRAPHY Left 11/25/2022   Procedure: LEFT HEART CATH AND CORONARY ANGIOGRAPHY;  Surgeon: Laurier Nancy, MD;  Location: [facility] INVASIVE CV LAB;  Service: Cardiovascular;  Laterality: Left;   SHOULDER SURGERY Right 04/09/2012   SPINE SURGERY     TOTAL KNEE ARTHROPLASTY Right 01/19/2021   Procedure: TOTAL KNEE ARTHROPLASTY - [place] Neighbor to Assist;  Surgeon: Kennedy Bucker, MD;  Location: [facility] ORS;  Service: Orthopedics;  Laterality: Right;   Social History   Occupational History   Occupation: disability   Occupation: stocking at gas station    Comment: 2 days per week  Tobacco Use   Smoking status: Every Day    Packs/day: 0.50    Years: 32.00    Additional pack years: 0.00    Total pack years: 16.00    Types: Cigarettes    Last attempt to quit: 10/25/2021    Years since quitting: 1.2    Passive exposure: Current   Smokeless tobacco: Never   Tobacco comments:    Continues to smoke approximately 5 cigarettes per day.  Has been 2 days since las cigarette.  Has not bought any more.  Using patches and gum.  5/23 hfb  Vaping Use   Vaping Use: Never used  Substance and Sexual Activity   Alcohol use: Yes    Alcohol/week: 3.0 standard drinks of alcohol    Types: 3 Cans of beer per week    Comment: occassionally    Drug use: Not Currently    Types: Cocaine, Marijuana    Comment: last on saturday   Sexual activity: Yes    Partners: Female    Birth control/protection: None

## 2023-01-19 MED ORDER — BUPIVACAINE HCL 0.5 % IJ SOLN
9.0000 mL | INTRAMUSCULAR | Status: AC | PRN
Start: 2023-01-18 — End: 2023-01-18
  Administered 2023-01-18: 9 mL via INTRA_ARTICULAR

## 2023-01-19 MED ORDER — METHYLPREDNISOLONE ACETATE 40 MG/ML IJ SUSP
40.0000 mg | INTRAMUSCULAR | Status: AC | PRN
Start: 2023-01-18 — End: 2023-01-18
  Administered 2023-01-18: 40 mg via INTRA_ARTICULAR

## 2023-01-19 MED ORDER — LIDOCAINE HCL 1 % IJ SOLN
5.0000 mL | INTRAMUSCULAR | Status: AC | PRN
Start: 2023-01-18 — End: 2023-01-18
  Administered 2023-01-18: 5 mL

## 2023-01-27 ENCOUNTER — Telehealth: Payer: Self-pay | Admitting: Family

## 2023-01-27 NOTE — Telephone Encounter (Signed)
Medication Refill - Medication: traMADol (ULTRAM) 50 MG tablet [[id number]]  DISCONTINUED   Has the patient contacted their pharmacy? Yes.   (Agent: If no, request that the patient contact the pharmacy for the refill. If patient does not wish to contact the pharmacy document the reason why and proceed with request.) (Agent: If yes, when and what did the pharmacy advise?)  Preferred Pharmacy (with phone number or street name):  [facility] [phone] [place], [place] - [address] Phone: [phone]  Fax: [phone]     Has the patient been seen for an appointment in the last year OR does the patient have an upcoming appointment? Yes.    Agent: Please be advised that RX refills may take up to 3 business days. We ask that you follow-up with your pharmacy.

## 2023-01-28 ENCOUNTER — Other Ambulatory Visit: Payer: Self-pay

## 2023-01-28 ENCOUNTER — Emergency Department (HOSPITAL_COMMUNITY)
Admission: EM | Admit: 2023-01-28 | Discharge: 2023-01-28 | Disposition: A | Discharge status: Other Acute Inpatient Hospital | Payer: MEDICAID | Attending: Emergency Medicine | Admitting: Emergency Medicine

## 2023-01-28 ENCOUNTER — Encounter (HOSPITAL_COMMUNITY): Payer: Self-pay

## 2023-01-28 ENCOUNTER — Inpatient Hospital Stay (HOSPITAL_COMMUNITY)
Admission: AD | Admit: 2023-01-28 | Discharge: 2023-02-03 | DRG: 885 | Disposition: A | Discharge status: Home / Self Care | Payer: MEDICAID | Source: Intra-hospital | Attending: Psychiatry | Admitting: Psychiatry

## 2023-01-28 DIAGNOSIS — F1910 Other psychoactive substance abuse, uncomplicated: Secondary | ICD-10-CM

## 2023-01-28 DIAGNOSIS — F250 Schizoaffective disorder, bipolar type: Secondary | ICD-10-CM

## 2023-01-28 DIAGNOSIS — J449 Chronic obstructive pulmonary disease, unspecified: Secondary | ICD-10-CM | POA: Diagnosis present

## 2023-01-28 DIAGNOSIS — I2510 Atherosclerotic heart disease of native coronary artery without angina pectoris: Secondary | ICD-10-CM | POA: Diagnosis present

## 2023-01-28 DIAGNOSIS — F17210 Nicotine dependence, cigarettes, uncomplicated: Secondary | ICD-10-CM | POA: Diagnosis present

## 2023-01-28 DIAGNOSIS — F1994 Other psychoactive substance use, unspecified with psychoactive substance-induced mood disorder: Secondary | ICD-10-CM | POA: Diagnosis present

## 2023-01-28 DIAGNOSIS — F332 Major depressive disorder, recurrent severe without psychotic features: Principal | ICD-10-CM | POA: Diagnosis present

## 2023-01-28 DIAGNOSIS — R45851 Suicidal ideations: Secondary | ICD-10-CM

## 2023-01-28 DIAGNOSIS — J45909 Unspecified asthma, uncomplicated: Secondary | ICD-10-CM | POA: Diagnosis present

## 2023-01-28 DIAGNOSIS — F1914 Other psychoactive substance abuse with psychoactive substance-induced mood disorder: Secondary | ICD-10-CM | POA: Insufficient documentation

## 2023-01-28 DIAGNOSIS — F10139 Alcohol abuse with withdrawal, unspecified: Secondary | ICD-10-CM | POA: Diagnosis present

## 2023-01-28 DIAGNOSIS — Z96651 Presence of right artificial knee joint: Secondary | ICD-10-CM | POA: Diagnosis present

## 2023-01-28 DIAGNOSIS — Z7982 Long term (current) use of aspirin: Secondary | ICD-10-CM | POA: Diagnosis not present

## 2023-01-28 DIAGNOSIS — F3170 Bipolar disorder, currently in remission, most recent episode unspecified: Secondary | ICD-10-CM

## 2023-01-28 DIAGNOSIS — I10 Essential (primary) hypertension: Secondary | ICD-10-CM | POA: Diagnosis present

## 2023-01-28 DIAGNOSIS — Z8673 Personal history of transient ischemic attack (TIA), and cerebral infarction without residual deficits: Secondary | ICD-10-CM

## 2023-01-28 DIAGNOSIS — Z79899 Other long term (current) drug therapy: Secondary | ICD-10-CM | POA: Insufficient documentation

## 2023-01-28 DIAGNOSIS — E782 Mixed hyperlipidemia: Secondary | ICD-10-CM

## 2023-01-28 DIAGNOSIS — Z7951 Long term (current) use of inhaled steroids: Secondary | ICD-10-CM | POA: Insufficient documentation

## 2023-01-28 DIAGNOSIS — Z955 Presence of coronary angioplasty implant and graft: Secondary | ICD-10-CM

## 2023-01-28 DIAGNOSIS — Z7901 Long term (current) use of anticoagulants: Secondary | ICD-10-CM | POA: Diagnosis not present

## 2023-01-28 DIAGNOSIS — I252 Old myocardial infarction: Secondary | ICD-10-CM | POA: Diagnosis not present

## 2023-01-28 DIAGNOSIS — K219 Gastro-esophageal reflux disease without esophagitis: Secondary | ICD-10-CM | POA: Diagnosis present

## 2023-01-28 DIAGNOSIS — G4700 Insomnia, unspecified: Secondary | ICD-10-CM | POA: Diagnosis present

## 2023-01-28 DIAGNOSIS — F1410 Cocaine abuse, uncomplicated: Secondary | ICD-10-CM | POA: Diagnosis present

## 2023-01-28 DIAGNOSIS — J431 Panlobular emphysema: Secondary | ICD-10-CM

## 2023-01-28 DIAGNOSIS — Z818 Family history of other mental and behavioral disorders: Secondary | ICD-10-CM

## 2023-01-28 LAB — VALPROIC ACID LEVEL: Valproic Acid Lvl: 32 ug/mL — ABNORMAL LOW (ref 50.0–100.0)

## 2023-01-28 LAB — COMPREHENSIVE METABOLIC PANEL
ALT: 19 U/L (ref 0–44)
AST: 23 U/L (ref 15–41)
Albumin: 4.1 g/dL (ref 3.5–5.0)
Alkaline Phosphatase: 60 U/L (ref 38–126)
Anion gap: 11 (ref 5–15)
BUN: 15 mg/dL (ref 8–23)
CO2: 22 mmol/L (ref 22–32)
Calcium: 8.8 mg/dL — ABNORMAL LOW (ref 8.9–10.3)
Chloride: 100 mmol/L (ref 98–111)
Creatinine, Ser: 0.89 mg/dL (ref 0.61–1.24)
GFR, Estimated: >60 mL/min (ref >60)
Glucose, Bld: 75 mg/dL (ref 70–99)
Potassium: 4.2 mmol/L (ref 3.5–5.1)
Sodium: 133 mmol/L — ABNORMAL LOW (ref 135–145)
Total Bilirubin: 1 mg/dL (ref 0.3–1.2)
Total Protein: 7.4 g/dL (ref 6.5–8.1)

## 2023-01-28 LAB — MAGNESIUM: Magnesium: 1.9 mg/dL (ref 1.7–2.4)

## 2023-01-28 LAB — CBC
HCT: 46.4 % (ref 39.0–52.0)
Hemoglobin: 16 g/dL (ref 13.0–17.0)
MCH: 32.5 pg (ref 26.0–34.0)
MCHC: 34.5 g/dL (ref 30.0–36.0)
MCV: 94.3 fL (ref 80.0–100.0)
Platelets: 189 10*3/uL (ref 150–400)
RBC: 4.92 MIL/uL (ref 4.22–5.81)
RDW: 14.2 % (ref 11.5–15.5)
WBC: 14.2 10*3/uL — ABNORMAL HIGH (ref 4.0–10.5)
nRBC: 0 % (ref 0.0–0.2)

## 2023-01-28 LAB — RAPID URINE DRUG SCREEN, HOSP PERFORMED
Amphetamines: NOT DETECTED
Barbiturates: NOT DETECTED
Benzodiazepines: NOT DETECTED
Cocaine: POSITIVE — AB
Opiates: NOT DETECTED
Tetrahydrocannabinol: NOT DETECTED

## 2023-01-28 LAB — URINALYSIS, ROUTINE W REFLEX MICROSCOPIC
Bacteria, UA: NONE SEEN
Bilirubin Urine: NEGATIVE
Glucose, UA: NEGATIVE mg/dL
Ketones, ur: NEGATIVE mg/dL
Leukocytes,Ua: NEGATIVE
Nitrite: NEGATIVE
Protein, ur: NEGATIVE mg/dL
Specific Gravity, Urine: 1.01 (ref 1.005–1.030)
pH: 5 (ref 5.0–8.0)

## 2023-01-28 LAB — SALICYLATE LEVEL: Salicylate Lvl: <7 mg/dL — ABNORMAL LOW (ref 7.0–30.0)

## 2023-01-28 LAB — ETHANOL: Alcohol, Ethyl (B): <10 mg/dL (ref <10)

## 2023-01-28 LAB — ACETAMINOPHEN LEVEL: Acetaminophen (Tylenol), Serum: <10 ug/mL — ABNORMAL LOW (ref 10–30)

## 2023-01-28 MED ORDER — DIPHENHYDRAMINE HCL 50 MG/ML IJ SOLN: 50.0000 mg | Freq: Three times a day (TID) | INTRAMUSCULAR | Status: DC | PRN

## 2023-01-28 MED ORDER — HYDROXYZINE HCL 25 MG PO TABS: 25.0000 mg | ORAL_TABLET | Freq: Three times a day (TID) | ORAL | Status: DC | PRN

## 2023-01-28 MED ORDER — LORAZEPAM 1 MG PO TABS
0.0000 mg | ORAL_TABLET | Freq: Four times a day (QID) | ORAL | Status: DC
  Administered 2023-01-28: 1 mg via ORAL
  Administered 2023-01-28: 2 mg via ORAL
  Filled 2023-01-28: qty 2
  Filled 2023-01-28: qty 1

## 2023-01-28 MED ORDER — ATORVASTATIN CALCIUM 40 MG PO TABS
80.0000 mg | ORAL_TABLET | Freq: Every day | ORAL | Status: DC
  Administered 2023-01-28: 80 mg via ORAL
  Filled 2023-01-28: qty 2

## 2023-01-28 MED ORDER — THIAMINE HCL 100 MG/ML IJ SOLN: 100.0000 mg | Freq: Every day | INTRAMUSCULAR | Status: DC

## 2023-01-28 MED ORDER — LORAZEPAM 2 MG/ML IJ SOLN: 0.0000 mg | Freq: Two times a day (BID) | INTRAMUSCULAR | Status: DC

## 2023-01-28 MED ORDER — ALBUTEROL SULFATE HFA 108 (90 BASE) MCG/ACT IN AERS: 2.0000 | INHALATION_SPRAY | Freq: Four times a day (QID) | RESPIRATORY_TRACT | Status: DC | PRN

## 2023-01-28 MED ORDER — DIPHENHYDRAMINE HCL 25 MG PO CAPS: 50.0000 mg | ORAL_CAPSULE | Freq: Three times a day (TID) | ORAL | Status: DC | PRN

## 2023-01-28 MED ORDER — TRAZODONE HCL 100 MG PO TABS
100.0000 mg | ORAL_TABLET | Freq: Every evening | ORAL | Status: DC | PRN
  Administered 2023-01-29 – 2023-01-31 (×2): 100 mg via ORAL
  Filled 2023-01-28 (×2): qty 1
  Filled 2023-01-28: qty 14

## 2023-01-28 MED ORDER — LORAZEPAM 2 MG/ML IJ SOLN: 0.0000 mg | Freq: Four times a day (QID) | INTRAMUSCULAR | Status: DC

## 2023-01-28 MED ORDER — FLUTICASONE FUROATE-VILANTEROL 100-25 MCG/ACT IN AEPB
1.0000 | INHALATION_SPRAY | Freq: Every day | RESPIRATORY_TRACT | Status: DC
  Filled 2023-01-28: qty 28

## 2023-01-28 MED ORDER — DIVALPROEX SODIUM 500 MG PO DR TAB: 500.0000 mg | DELAYED_RELEASE_TABLET | Freq: Every day | ORAL | Status: DC

## 2023-01-28 MED ORDER — DIVALPROEX SODIUM 500 MG PO DR TAB
500.0000 mg | DELAYED_RELEASE_TABLET | Freq: Every day | ORAL | Status: DC
  Administered 2023-01-29 – 2023-01-30 (×2): 500 mg via ORAL
  Filled 2023-01-28 (×4): qty 1

## 2023-01-28 MED ORDER — DIVALPROEX SODIUM 500 MG PO DR TAB
1500.0000 mg | DELAYED_RELEASE_TABLET | Freq: Every day | ORAL | Status: DC
  Administered 2023-01-28: 500 mg via ORAL
  Filled 2023-01-28: qty 3

## 2023-01-28 MED ORDER — ALBUTEROL SULFATE HFA 108 (90 BASE) MCG/ACT IN AERS
2.0000 | INHALATION_SPRAY | Freq: Four times a day (QID) | RESPIRATORY_TRACT | Status: DC | PRN
  Administered 2023-02-01: 2 via RESPIRATORY_TRACT

## 2023-01-28 MED ORDER — LORAZEPAM 1 MG PO TABS
2.0000 mg | ORAL_TABLET | Freq: Three times a day (TID) | ORAL | Status: DC | PRN
  Filled 2023-01-28: qty 2

## 2023-01-28 MED ORDER — THIAMINE MONONITRATE 100 MG PO TABS
100.0000 mg | ORAL_TABLET | Freq: Every day | ORAL | Status: DC
  Administered 2023-01-28: 100 mg via ORAL
  Filled 2023-01-28: qty 1

## 2023-01-28 MED ORDER — ATORVASTATIN CALCIUM 80 MG PO TABS
80.0000 mg | ORAL_TABLET | Freq: Every day | ORAL | Status: DC
  Administered 2023-01-29: 80 mg via ORAL
  Filled 2023-01-28 (×3): qty 1

## 2023-01-28 MED ORDER — LORAZEPAM 1 MG PO TABS
0.0000 mg | ORAL_TABLET | Freq: Four times a day (QID) | ORAL | Status: DC
  Administered 2023-01-29: 1 mg via ORAL
  Administered 2023-01-29 (×2): 2 mg via ORAL
  Administered 2023-01-30: 1 mg via ORAL
  Filled 2023-01-28: qty 2
  Filled 2023-01-28 (×2): qty 1

## 2023-01-28 MED ORDER — SODIUM CHLORIDE 0.9 % IV BOLUS
1000.0000 mL | Freq: Once | INTRAVENOUS | Status: AC
  Administered 2023-01-28: 1000 mL via INTRAVENOUS

## 2023-01-28 MED ORDER — LORAZEPAM 1 MG PO TABS: 0.0000 mg | ORAL_TABLET | Freq: Two times a day (BID) | ORAL | Status: DC

## 2023-01-28 MED ORDER — BUDESON-GLYCOPYRROL-FORMOTEROL 160-9-4.8 MCG/ACT IN AERO: 2.0000 | INHALATION_SPRAY | Freq: Two times a day (BID) | RESPIRATORY_TRACT | Status: DC

## 2023-01-28 MED ORDER — DIVALPROEX SODIUM 500 MG PO DR TAB: 1000.0000 mg | DELAYED_RELEASE_TABLET | Freq: Every day | ORAL | Status: DC

## 2023-01-28 MED ORDER — UMECLIDINIUM BROMIDE 62.5 MCG/ACT IN AEPB
1.0000 | INHALATION_SPRAY | Freq: Every day | RESPIRATORY_TRACT | Status: DC
  Filled 2023-01-28: qty 7

## 2023-01-28 MED ORDER — DIAZEPAM 5 MG/ML IJ SOLN
2.5000 mg | Freq: Once | INTRAMUSCULAR | Status: AC
  Administered 2023-01-28: 2.5 mg via INTRAVENOUS
  Filled 2023-01-28: qty 2

## 2023-01-28 MED ORDER — GABAPENTIN 300 MG PO CAPS
600.0000 mg | ORAL_CAPSULE | Freq: Two times a day (BID) | ORAL | Status: DC
  Administered 2023-01-29 (×2): 600 mg via ORAL
  Filled 2023-01-28 (×5): qty 2

## 2023-01-28 MED ORDER — QUETIAPINE FUMARATE 100 MG PO TABS: 100.0000 mg | ORAL_TABLET | Freq: Every day | ORAL | Status: DC

## 2023-01-28 MED ORDER — LORAZEPAM 2 MG/ML IJ SOLN: 2.0000 mg | Freq: Three times a day (TID) | INTRAMUSCULAR | Status: DC | PRN

## 2023-01-28 MED ORDER — DICLOFENAC SODIUM 1 % EX GEL
2.0000 g | Freq: Four times a day (QID) | CUTANEOUS | Status: DC
  Administered 2023-01-29 – 2023-02-02 (×7): 2 g via TOPICAL
  Filled 2023-01-28 (×3): qty 100

## 2023-01-28 MED ORDER — ACETAMINOPHEN 325 MG PO TABS
650.0000 mg | ORAL_TABLET | Freq: Four times a day (QID) | ORAL | Status: DC | PRN
  Administered 2023-01-30: 650 mg via ORAL
  Filled 2023-01-28: qty 2

## 2023-01-28 MED ORDER — VITAMIN B-1 100 MG PO TABS
100.0000 mg | ORAL_TABLET | Freq: Every day | ORAL | Status: DC
  Administered 2023-01-29 – 2023-02-03 (×6): 100 mg via ORAL
  Filled 2023-01-28 (×7): qty 1

## 2023-01-28 MED ORDER — DICLOFENAC SODIUM 1 % EX GEL
2.0000 g | Freq: Four times a day (QID) | CUTANEOUS | Status: DC
  Filled 2023-01-28: qty 100

## 2023-01-28 MED ORDER — DULOXETINE HCL 60 MG PO CPEP
90.0000 mg | ORAL_CAPSULE | Freq: Every day | ORAL | Status: DC
  Administered 2023-01-29: 90 mg via ORAL
  Filled 2023-01-28 (×3): qty 1

## 2023-01-28 MED ORDER — ASPIRIN 81 MG PO TBEC
81.0000 mg | DELAYED_RELEASE_TABLET | Freq: Every day | ORAL | Status: DC
  Administered 2023-01-28: 81 mg via ORAL
  Filled 2023-01-28: qty 1

## 2023-01-28 MED ORDER — HALOPERIDOL LACTATE 5 MG/ML IJ SOLN: 5.0000 mg | Freq: Three times a day (TID) | INTRAMUSCULAR | Status: DC | PRN

## 2023-01-28 MED ORDER — DULOXETINE HCL 30 MG PO CPEP
90.0000 mg | ORAL_CAPSULE | Freq: Every day | ORAL | Status: DC
  Administered 2023-01-28: 90 mg via ORAL
  Filled 2023-01-28: qty 3

## 2023-01-28 MED ORDER — HALOPERIDOL 5 MG PO TABS: 5.0000 mg | ORAL_TABLET | Freq: Three times a day (TID) | ORAL | Status: DC | PRN

## 2023-01-28 MED ORDER — DIVALPROEX SODIUM 500 MG PO DR TAB
1000.0000 mg | DELAYED_RELEASE_TABLET | Freq: Every day | ORAL | Status: DC
  Administered 2023-01-29 – 2023-02-02 (×5): 1000 mg via ORAL
  Filled 2023-01-28: qty 2
  Filled 2023-01-28: qty 28
  Filled 2023-01-28 (×5): qty 2

## 2023-01-28 MED ORDER — ALUM & MAG HYDROXIDE-SIMETH 200-200-20 MG/5ML PO SUSP
30.0000 mL | ORAL | Status: DC | PRN
  Administered 2023-01-31 – 2023-02-01 (×2): 30 mL via ORAL
  Filled 2023-01-28 (×2): qty 30

## 2023-01-28 MED ORDER — ASPIRIN 81 MG PO TBEC
81.0000 mg | DELAYED_RELEASE_TABLET | Freq: Every day | ORAL | Status: DC
  Administered 2023-01-29 – 2023-02-03 (×6): 81 mg via ORAL
  Filled 2023-01-28 (×6): qty 1
  Filled 2023-01-28: qty 14
  Filled 2023-01-28: qty 1

## 2023-01-28 MED ORDER — MAGNESIUM HYDROXIDE 400 MG/5ML PO SUSP: 30.0000 mL | Freq: Every day | ORAL | Status: DC | PRN

## 2023-01-28 MED ORDER — GABAPENTIN 300 MG PO CAPS
600.0000 mg | ORAL_CAPSULE | Freq: Two times a day (BID) | ORAL | Status: DC
  Administered 2023-01-28: 600 mg via ORAL
  Filled 2023-01-28: qty 2

## 2023-01-28 MED ORDER — QUETIAPINE FUMARATE 100 MG PO TABS
100.0000 mg | ORAL_TABLET | Freq: Every day | ORAL | Status: DC
  Administered 2023-01-29 – 2023-02-02 (×5): 100 mg via ORAL
  Filled 2023-01-28 (×4): qty 1
  Filled 2023-01-28: qty 14
  Filled 2023-01-28 (×2): qty 1

## 2023-01-28 MED ORDER — TRAZODONE HCL 100 MG PO TABS: 100.0000 mg | ORAL_TABLET | Freq: Every evening | ORAL | Status: DC | PRN

## 2023-01-28 NOTE — ED Triage Notes (Addendum)
Patient reports he wants to kill himself and doesn't want to live Financial and family issues Says he will hang himself or cut throat  Says he has been feeling this way x1 week Previous attempts per patient several months ago Patient states "I keep making the same mistakes over and over" Patient says he is referring to smoking crack and drinking. Last usage was this morning for both.

## 2023-01-28 NOTE — ED Notes (Addendum)
In process of collecting vitals, inquired from pt if in possession of any weapons to harm self or others. Pt turned over orange box cutter. Item labeled w/pt label and placed in biohazard bag. Item turned over to [employer]. Pt changed out into burgundy scrubs. Personal items collected and placed in two labeled belongings bags- including shirt, shorts, socks, shoes; phone, watch, necklace, liter, two sets of keys placed in labeled biohazard bag and placed inside of labeled belongings bag- all bags (3) placed inside of pt cabinets at 1-8 nursing station. Melody RN advised.

## 2023-01-28 NOTE — ED Provider Notes (Signed)
[place] [facility]PARTMENT AT [facility] Provider Note   CSN: [id number] Arrival date & time: 01/28/23  0715     History  Chief Complaint  Patient presents with   Suicidal    Keith Gibbs is a 62 y.o. male.  Pt is a 62 yo male with pmhx significant for anxiety, depression, COPD, CVA, CAD (cath in May of this year showed stents were not stenosed), GERD, HTN, arthritis, Bipolar d/o, polysubstance abuse, and chronic pain.  Pt said he's been having financial and family issues.  He knows he's been drinking too much and using too much crack.  He wants to hang himself or cut his throat.  He did overdose and require a medical admission in January of this year.  After medical admission, he had a psych inpatient admission.       Home Medications Prior to Admission medications   Medication Sig Start Date End Date Taking? Authorizing Provider  albuterol (VENTOLIN HFA) 108 (90 Base) MCG/ACT inhaler Inhale 2 puffs into the lungs every 6 (six) hours as needed for wheezing or shortness of breath. 05/28/19  Yes Hoy Register, MD  aspirin EC 81 MG tablet Take 1 tablet (81 mg total) by mouth daily. Swallow whole. 01/03/22  Yes Claiborne Rigg, NP  atorvastatin (LIPITOR) 80 MG tablet TAKE 1 TABLET BY MOUTH EVERY DAY 11/10/22  Yes Margaretann Loveless, MD  Budeson-Glycopyrrol-Formoterol (BREZTRI AEROSPHERE) 160-9-4.8 MCG/ACT AERO Inhale 2 puffs into the lungs in the morning and at bedtime.   Yes [provider]  diclofenac Sodium (VOLTAREN) 1 % GEL Apply 2 g topically 4 (four) times daily. 05/01/22  Yes Doda, Traci Sermon, MD  divalproex (DEPAKOTE) 500 MG DR tablet Take 3 tablets (1,500 mg total) by mouth daily. Take 500mg  in the AM and 1000mg  at night. 01/11/23  Yes Bobbye Morton, MD  DULoxetine (CYMBALTA) 30 MG capsule Take 3 capsules (90 mg total) by mouth daily. 01/06/23  Yes Bobbye Morton, MD  gabapentin (NEURONTIN) 600 MG tablet Take 1 tablet (600 mg total) by mouth 2 (two) times  daily. 01/06/23  Yes Bobbye Morton, MD  hydrOXYzine (ATARAX) 25 MG tablet Take 1 tablet (25 mg total) by mouth 3 (three) times daily as needed for anxiety. 01/06/23  Yes Bobbye Morton, MD  nitroGLYCERIN (NITROSTAT) 0.4 MG SL tablet Place 1 tablet (0.4 mg total) under the tongue every 5 (five) minutes as needed for chest pain. 12/02/22 12/02/23 Yes Laurier Nancy, MD  QUEtiapine (SEROQUEL) 100 MG tablet Take 1 tablet (100 mg total) by mouth at bedtime. 01/06/23  Yes Bobbye Morton, MD  traZODone (DESYREL) 100 MG tablet Take 1 tablet (100 mg total) by mouth at bedtime as needed for sleep. 08/16/22  Yes Toy Cookey E, NP  cyclobenzaprine (FLEXERIL) 10 MG tablet Take 1 tablet (10 mg total) by mouth 3 (three) times daily as needed for muscle spasms. Patient not taking: Reported on 01/28/2023 08/26/22   Bedelia Person, MD      Allergies    Asenapine, Kasandra Knudsen [lurasidone hcl], and Lurasidone    Review of Systems   Review of Systems  Psychiatric/Behavioral:  Positive for suicidal ideas. The patient is nervous/anxious.   All other systems reviewed and are negative.   Physical Exam Updated Vital Signs BP 119/84 (BP Location: Left Arm)   Pulse 65   Temp 98.4 F (36.9 C) (Oral)   Resp (!) 22   Ht 6\' 3"  (1.905 m)   Wt  95.3 kg   SpO2 98%   BMI 26.25 kg/m  Physical Exam Vitals and nursing note reviewed.  Constitutional:      Appearance: Normal appearance.  HENT:     Head: Normocephalic and atraumatic.     Right Ear: External ear normal.     Left Ear: External ear normal.     Nose: Nose normal.     Mouth/Throat:     Mouth: Mucous membranes are moist.     Pharynx: Oropharynx is clear.  Eyes:     Extraocular Movements: Extraocular movements intact.     Conjunctiva/sclera: Conjunctivae normal.     Pupils: Pupils are equal, round, and reactive to light.  Cardiovascular:     Rate and Rhythm: Normal rate and regular rhythm.     Pulses: Normal pulses.     Heart sounds: Normal heart  sounds.  Pulmonary:     Effort: Pulmonary effort is normal.     Breath sounds: Normal breath sounds.  Abdominal:     General: Abdomen is flat. Bowel sounds are normal.     Palpations: Abdomen is soft.  Musculoskeletal:        General: Normal range of motion.     Cervical back: Normal range of motion and neck supple.  Skin:    General: Skin is warm.     Capillary Refill: Capillary refill takes less than 2 seconds.  Neurological:     Mental Status: He is alert.     Motor: Tremor present.  Psychiatric:        Mood and Affect: Mood is anxious. Affect is tearful.        Thought Content: Thought content includes suicidal ideation. Thought content includes suicidal plan.     ED Results / Procedures / Treatments   Labs (all labs ordered are listed, but only abnormal results are displayed) Labs Reviewed  COMPREHENSIVE METABOLIC PANEL - Abnormal; Notable for the following components:      Result Value   Sodium 133 (*)    Calcium 8.8 (*)    All other components within normal limits  SALICYLATE LEVEL - Abnormal; Notable for the following components:   Salicylate Lvl <7.0 (*)    All other components within normal limits  ACETAMINOPHEN LEVEL - Abnormal; Notable for the following components:   Acetaminophen (Tylenol), Serum <10 (*)    All other components within normal limits  CBC - Abnormal; Notable for the following components:   WBC 14.2 (*)    All other components within normal limits  RAPID URINE DRUG SCREEN, HOSP PERFORMED - Abnormal; Notable for the following components:   Cocaine POSITIVE (*)    All other components within normal limits  URINALYSIS, ROUTINE W REFLEX MICROSCOPIC - Abnormal; Notable for the following components:   Hgb urine dipstick MODERATE (*)    All other components within normal limits  VALPROIC ACID LEVEL - Abnormal; Notable for the following components:   Valproic Acid Lvl 32 (*)    All other components within normal limits  ETHANOL  MAGNESIUM     EKG None EKG not crossing over in EPIC, but HR is 55 with no ST or t wave changes.    Radiology No results found.  Procedures Procedures    Medications Ordered in ED Medications  LORazepam (ATIVAN) injection 0-4 mg (has no administration in time range)    Or  LORazepam (ATIVAN) tablet 0-4 mg (has no administration in time range)  LORazepam (ATIVAN) injection 0-4 mg (has no administration in time  range)    Or  LORazepam (ATIVAN) tablet 0-4 mg (has no administration in time range)  thiamine (VITAMIN B1) tablet 100 mg (has no administration in time range)    Or  thiamine (VITAMIN B1) injection 100 mg (has no administration in time range)  albuterol (VENTOLIN HFA) 108 (90 Base) MCG/ACT inhaler 2 puff (has no administration in time range)  aspirin EC tablet 81 mg (has no administration in time range)  atorvastatin (LIPITOR) tablet 80 mg (has no administration in time range)  diclofenac Sodium (VOLTAREN) 1 % topical gel 2 g (has no administration in time range)  divalproex (DEPAKOTE) DR tablet 1,500 mg (has no administration in time range)  DULoxetine (CYMBALTA) DR capsule 90 mg (has no administration in time range)  gabapentin (NEURONTIN) capsule 600 mg (has no administration in time range)  hydrOXYzine (ATARAX) tablet 25 mg (has no administration in time range)  QUEtiapine (SEROQUEL) tablet 100 mg (has no administration in time range)  traZODone (DESYREL) tablet 100 mg (has no administration in time range)  fluticasone furoate-vilanterol (BREO ELLIPTA) 100-25 MCG/ACT 1 puff (has no administration in time range)    And  umeclidinium bromide (INCRUSE ELLIPTA) 62.5 MCG/ACT 1 puff (has no administration in time range)  sodium chloride 0.9 % bolus 1,000 mL (0 mLs Intravenous Stopped 01/28/23 1005)  diazepam (VALIUM) injection 2.5 mg (2.5 mg Intravenous Given 01/28/23 2956)    ED Course/ Medical Decision Making/ A&P                             Medical Decision Making Amount and/or  Complexity of Data Reviewed Labs: ordered.  Risk OTC drugs. Prescription drug management.   This patient presents to the ED for concern of si, this involves an extensive number of treatment options, and is a complaint that carries with it a high risk of complications and morbidity.  The differential diagnosis includes overdose, electrolyte abn, w/dr   Co morbidities that complicate the patient evaluation  anxiety, depression, COPD, CVA, CAD (cath in May of this year showed stents were not stenosed), GERD, HTN, arthritis, Bipolar d/o, polysubstance abuse, and chronic pain   Additional history obtained:  Additional history obtained from epic chart review  Lab Tests:  I Ordered, and personally interpreted labs.  The pertinent results include:  cbc with wbc elevated at 14.2, cmp nl, uds + cocaine, etoh neg, acet neg, sal neg; depakote low at 32; mg nl at 1.9; ua neg for uti   Cardiac Monitoring:  The patient was maintained on a cardiac monitor.  I personally viewed and interpreted the cardiac monitored which showed an underlying rhythm of: nsr   Medicines ordered and prescription drug management:  I ordered medication including ivfs/valium  for sx  Reevaluation of the patient after these medicines showed that the patient improved I have reviewed the patients home medicines and have made adjustments as needed  Consultations Obtained:  I requested consultation with TTS,  and discussed lab and imaging findings as well as pertinent plan - TTS recommends inpatient placement   Problem List / ED Course:  SI with plan:  he is medically clear.  TTS consult placed. Hx ETOH abuse:  pt will be put on CIWA protocol   Reevaluation:  After the interventions noted above, I reevaluated the patient and found that they have :improved   Social Determinants of Health:  Lives at home   Dispostion:  After consideration of the diagnostic results  and the patients response to treatment,  I feel that the patent would benefit from inpatient psych.        Final Clinical Impression(s) / ED Diagnoses Final diagnoses:  Suicidal ideation  Polysubstance abuse [facility])    Rx / DC Orders ED Discharge Orders     None         Jacalyn Lefevre, MD 01/28/23 1318

## 2023-01-28 NOTE — ED Notes (Signed)
Safe transport called for pt  

## 2023-01-28 NOTE — ED Notes (Addendum)
Called [facility] to give report. Spoke with Industrial/product designer

## 2023-01-28 NOTE — Consult Note (Cosign Needed Addendum)
BH ED ASSESSMENT   Reason for Consult:  Psychiatry evaluation note Referring Physician:  ER Physician Patient Identification: Keith Gibbs MRN:  [id number] ED Chief Complaint: Severe recurrent major depression without psychotic features (HCC)  Diagnosis:  Principal Problem:   Severe recurrent major depression without psychotic features (HCC) Active Problems:   Suicide ideation   Substance induced mood disorder [facility])   ED Assessment Time Calculation: Start Time: 1148 Stop Time: 1218 Total Time in Minutes (Assessment Completion): 30   Subjective:   Keith Gibbs is a 62 y.o. male patient admitted with previous Psychiatry hx of Bipolar disorder, MDD, Alcoholism, Suicide attempt  and Polysubstance abuse came to the ER with c/o of feeling suicidal with plan to cut his throat or hang himself.  He has a hx or previous suicide attempt by OD.  HPI:  Patient was seen this morning presented a sad affect and described his mood as depressed.  He rated his depression 10/10 with 10 being severe depression.  Patient reports that he keeps making mistake of using Alcohol and Cocaine.  Patient also states his mother insults him, belittle him and makes fun of his addiction.,  He receives SSI /SSA monthly and he states his mother is always asking him to give her more money.  Patient lives in his mother's home and states that the arrangement is not healthy as he is always arguing with her.  He also admits that his Alcoholism and using Cocaine makes his mother angry.  Patient is suicidal with plan to cut his throat or hand himself.  Patient was tearful during our interaction.  Patient reports using Cocaine once or twice a week and drinks Alcohol three times a week.  Last Cocaine use was last night and last Alcohol was last night as well.  Patient UDS is positive for Cocaine.   Caucasian male, 62 years old seen this morning for c/o severe depression with suicide ideation and plan.  He feels hopeless and worthless  he says based on inability to stop using Alcohol and Cocaine and also inability to his own place and leave his mother's house.  He is alert and oriented x5, reports poor sleep and appetite.  Patient meets criteria for inpatient mental health care.  Patient receives outpatient Mental health at [facility] and was hospitalized at [facility] January this year.  We will seek bed placement at any facility with available bed ane we will resume home medications.  Patient denies HI/AVH and no mention of Paranoia.  Past Psychiatric History: previous Psychiatry hx of Bipolar disorder, MDD, Alcoholism, Suicide attempt  and Polysubstance abuse.  Previous inpatient Psychiatry hospitalization yo [facility] recorded.  One recorded previous OD suicide attempt.  He receives Mental health care at [facility].  Risk to Self or Others: Is the patient at risk to self? Yes Has the patient been a risk to self in the past 6 months? Yes Has the patient been a risk to self within the distant past? Yes Is the patient a risk to others? No Has the patient been a risk to others in the past 6 months? No Has the patient been a risk to others within the distant past? No  [place] Scale:  Flowsheet Row ED from 01/28/2023 in [facility] Emergency Department at [facility] Admission (Discharged) from 08/23/2022 in [facility] Pre-Admission Testing 60 from 08/11/2022 in [facility] PREADMISSION TESTING  C-SSRS RISK CATEGORY High Risk Error: Q3, 4, or 5 should not be populated  when Q2 is No High Risk       AIMS:  , , ,  ,   ASAM:    Substance Abuse:     Past Medical History:  Past Medical History:  Diagnosis Date   Anginal pain (HCC)    Anxiety    Arthritis    Bipolar 1 disorder (HCC)    Bursitis    CAD (coronary artery disease)    Chronic pain    COPD (chronic obstructive pulmonary disease) (HCC)    Current use of long term anticoagulation    DAPT (ASA + clopidogrel)   Depression    Diverticulitis     Dyspnea    GERD (gastroesophageal reflux disease)    Grade I diastolic dysfunction    Hepatitis C 2012   No longer has Hep C   HLD (hyperlipidemia)    Hypertension    MI (myocardial infarction) (HCC)    Polysubstance abuse (HCC)    cocaine, marijuana, ETOH   PUD (peptic ulcer disease)    S/P angioplasty with stent 06/10/2016   a.) 90% stenosis of pLAD to mLAD - 2.5 x 18 mm Xience Alpine (DES x 1) placed to pLAD   S/P PTCA (percutaneous transluminal coronary angioplasty) 12/04/2019   a.) 60% in stent restenosis of DES to pLAD; LVEF 65%.   Schizophrenia (HCC)    Stroke [facility])    Valvular insufficiency    a.) Mild MR, TR, PR; mild to moderate AR on 03/05/2018 TTE    Past Surgical History:  Procedure Laterality Date   ABDOMINAL SURGERY     removed small piece of intestines due to [facility] Diverticulosis   ANTERIOR LAT LUMBAR FUSION N/A 08/23/2022   Procedure: DIRECT LATERAL INTERBODY FUSION  LUMBAR TWO- LUMBAR THREE, LUMBAR THREE-LUMBAR FOUR , EXPLORE AND EXTEND FUSION LUMBAR TWO-LUMBAR FIVE, POSTERIOR DECOMPRESSION LUMBAR THREE-LUMBAR FOUR, LEFT LUMBAR TWO-LUMBAR THREE;  Surgeon: Bedelia Person, MD;  Location: [facility] OR;  Service: Neurosurgery;  Laterality: N/A;   APPENDECTOMY     BACK SURGERY     CARDIAC CATHETERIZATION Left 06/10/2016   Procedure: Left Heart Cath and Coronary Angiography;  Surgeon: Laurier Nancy, MD;  Location: [facility] INVASIVE CV LAB;  Service: Cardiovascular;  Laterality: Left;   CARDIAC CATHETERIZATION N/A 06/10/2016   Procedure: Coronary Stent Intervention;  Surgeon: Alwyn Pea, MD;  Location: [facility] INVASIVE CV LAB;  Service: Cardiovascular;  Laterality: N/A;   CHOLECYSTECTOMY N/A 02/17/2022   Procedure: LAPAROSCOPIC CHOLECYSTECTOMY;  Surgeon: Sheliah Hatch De Blanch, MD;  Location: [facility] OR;  Service: General;  Laterality: N/A;   COLON SURGERY     COLONOSCOPY     COLONOSCOPY WITH PROPOFOL N/A 01/05/2017   Procedure: COLONOSCOPY WITH PROPOFOL;  Surgeon: Wyline Mood, MD;  Location: [facility] ENDOSCOPY;  Service: Endoscopy;  Laterality: N/A;   COLONOSCOPY WITH PROPOFOL N/A 02/13/2020   Procedure: COLONOSCOPY WITH PROPOFOL;  Surgeon: Pasty Spillers, MD;  Location: [facility] ENDOSCOPY;  Service: Endoscopy;  Laterality: N/A;   CORONARY ANGIOPLASTY WITH STENT PLACEMENT     CORONARY PRESSURE/FFR STUDY N/A 12/04/2019   Procedure: INTRAVASCULAR PRESSURE WIRE/FFR STUDY;  Surgeon: Tonny Bollman, MD;  Location: [facility] INVASIVE CV LAB;  Service: Cardiovascular;  Laterality: N/A;   ESOPHAGOGASTRODUODENOSCOPY (EGD) WITH PROPOFOL N/A 01/05/2017   Procedure: ESOPHAGOGASTRODUODENOSCOPY (EGD) WITH PROPOFOL;  Surgeon: Wyline Mood, MD;  Location: [facility] ENDOSCOPY;  Service: Endoscopy;  Laterality: N/A;   ESOPHAGOGASTRODUODENOSCOPY (EGD) WITH PROPOFOL N/A 02/13/2020   Procedure: ESOPHAGOGASTRODUODENOSCOPY (EGD) WITH PROPOFOL;  Surgeon: Pasty Spillers, MD;  Location:  [facility] ENDOSCOPY;  Service: Endoscopy;  Laterality: N/A;   KNEE ARTHROSCOPY WITH MEDIAL MENISECTOMY Right 09/05/2017   Procedure: KNEE ARTHROSCOPY WITH MEDIAL AND LATERAL  MENISECTOMY PARTIAL SYNOVECTOMY;  Surgeon: Kennedy Bucker, MD;  Location: [facility] ORS;  Service: Orthopedics;  Laterality: Right;   LEFT HEART CATH AND CORONARY ANGIOGRAPHY N/A 12/04/2019   Procedure: LEFT HEART CATH AND CORONARY ANGIOGRAPHY;  Surgeon: Tonny Bollman, MD;  Location: [facility] INVASIVE CV LAB;  Service: Cardiovascular;  Laterality: N/A;   LEFT HEART CATH AND CORONARY ANGIOGRAPHY Left 11/25/2022   Procedure: LEFT HEART CATH AND CORONARY ANGIOGRAPHY;  Surgeon: Laurier Nancy, MD;  Location: [facility] INVASIVE CV LAB;  Service: Cardiovascular;  Laterality: Left;   SHOULDER SURGERY Right 04/09/2012   SPINE SURGERY     TOTAL KNEE ARTHROPLASTY Right 01/19/2021   Procedure: TOTAL KNEE ARTHROPLASTY - [place] Neighbor to Assist;  Surgeon: Kennedy Bucker, MD;  Location: [facility] ORS;  Service: Orthopedics;  Laterality: Right;   Family History:  Family History   Problem Relation Age of Onset   Osteoarthritis Mother    Heart disease Mother    Hypertension Mother    Depression Mother    Heart disease Father    Early death Father    Hypertension Father    Heart attack Father    Hypertension Sister    Parkinson's disease Maternal Grandfather    Prostate cancer Neg Hx    Bladder Cancer Neg Hx    Kidney cancer Neg Hx    Tremor Neg Hx    Family Psychiatric  History: Denies Social History:  Social History   Substance and Sexual Activity  Alcohol Use Yes   Alcohol/week: 3.0 standard drinks of alcohol   Types: 3 Cans of beer per week   Comment: occassionally      Social History   Substance and Sexual Activity  Drug Use Yes   Types: Cocaine, Marijuana   Comment: last on saturday    Social History   Socioeconomic History   Marital status: Widowed    Spouse name: Not on file   Number of children: Not on file   Years of education: Not on file   Highest education level: Not on file  Occupational History   Occupation: disability   Occupation: stocking at gas station    Comment: 2 days per week  Tobacco Use   Smoking status: Every Day    Packs/day: 0.50    Years: 32.00    Additional pack years: 0.00    Total pack years: 16.00    Types: Cigarettes    Last attempt to quit: 10/25/2021    Years since quitting: 1.2    Passive exposure: Current   Smokeless tobacco: Never   Tobacco comments:    Continues to smoke approximately 5 cigarettes per day.  Has been 2 days since las cigarette.  Has not bought any more.  Using patches and gum.  5/23 hfb  Vaping Use   Vaping Use: Never used  Substance and Sexual Activity   Alcohol use: Yes    Alcohol/week: 3.0 standard drinks of alcohol    Types: 3 Cans of beer per week    Comment: occassionally    Drug use: Yes    Types: Cocaine, Marijuana    Comment: last on saturday   Sexual activity: Yes    Partners: Female    Birth control/protection: None  Other Topics Concern   Not on file   Social History Narrative   Not on file   Social  Determinants of Health   Financial Resource Strain: Not on file  Food Insecurity: No Food Insecurity (08/23/2022)   Hunger Vital Sign    Worried About Running Out of Food in the Last Year: Never true    Ran Out of Food in the Last Year: Never true  Transportation Needs: No Transportation Needs (08/23/2022)   PRAPARE - Administrator, Civil Service (Medical): No    Lack of Transportation (Non-Medical): No  Physical Activity: Not on file  Stress: Not on file  Social Connections: Not on file   Additional Social History:    Allergies:   Allergies  Allergen Reactions   Asenapine Other (See Comments) and Nausea And Vomiting    Increased tremors   Latuda [Lurasidone Hcl] Other (See Comments)    Tremors     Lurasidone Other (See Comments)    Labs:  Results for orders placed or performed during the hospital encounter of 01/28/23 (from the past 48 hour(s))  Comprehensive metabolic panel     Status: Abnormal   Collection Time: 01/28/23  7:30 AM  Result Value Ref Range   Sodium 133 (L) 135 - 145 mmol/L   Potassium 4.2 3.5 - 5.1 mmol/L   Chloride 100 98 - 111 mmol/L   CO2 22 22 - 32 mmol/L   Glucose, Bld 75 70 - 99 mg/dL    Comment: Glucose reference range applies only to samples taken after fasting for at least 8 hours.   BUN 15 8 - 23 mg/dL   Creatinine, Ser 1.61 0.61 - 1.24 mg/dL   Calcium 8.8 (L) 8.9 - 10.3 mg/dL   Total Protein 7.4 6.5 - 8.1 g/dL   Albumin 4.1 3.5 - 5.0 g/dL   AST 23 15 - 41 U/L   ALT 19 0 - 44 U/L   Alkaline Phosphatase 60 38 - 126 U/L   Total Bilirubin 1.0 0.3 - 1.2 mg/dL   GFR, Estimated >09 >60 mL/min    Comment: (NOTE) Calculated using the CKD-EPI Creatinine Equation (2021)    Anion gap 11 5 - 15    Comment: Performed at [facility], [address], [place], [place] [address]  Ethanol     Status: None   Collection Time: 01/28/23  7:30 AM  Result Value Ref Range    Alcohol, Ethyl (B) <10 <10 mg/dL    Comment: (NOTE) Lowest detectable limit for serum alcohol is 10 mg/dL.  For medical purposes only. Performed at [facility], [address] [place], [place] [address]   Salicylate level     Status: Abnormal   Collection Time: 01/28/23  7:30 AM  Result Value Ref Range   Salicylate Lvl <7.0 (L) 7.0 - 30.0 mg/dL    Comment: Performed at [facility], [address] [place], [place] [address]  Acetaminophen level     Status: Abnormal   Collection Time: 01/28/23  7:30 AM  Result Value Ref Range   Acetaminophen (Tylenol), Serum <10 (L) 10 - 30 ug/mL    Comment: (NOTE) Therapeutic concentrations vary significantly. A range of 10-30 ug/mL  may be an effective concentration for many patients. However, some  are best treated at concentrations outside of this range. Acetaminophen concentrations >150 ug/mL at 4 hours after ingestion  and >50 ug/mL at 12 hours after ingestion are often associated with  toxic reactions.  Performed at [facility], [address] [place], [place] [address]   cbc  Status: Abnormal   Collection Time: 01/28/23  7:30 AM  Result Value Ref Range   WBC 14.2 (H) 4.0 - 10.5 K/uL   RBC 4.92 4.22 - 5.81 MIL/uL   Hemoglobin 16.0 13.0 - 17.0 g/dL   HCT 16.1 09.6 - 04.5 %   MCV 94.3 80.0 - 100.0 fL   MCH 32.5 26.0 - 34.0 pg   MCHC 34.5 30.0 - 36.0 g/dL   RDW 40.9 81.1 - 91.4 %   Platelets 189 150 - 400 K/uL   nRBC 0.0 0.0 - 0.2 %    Comment: Performed at [facility], [address] [place], [place] [address]  Rapid urine drug screen (hospital performed)     Status: Abnormal   Collection Time: 01/28/23  7:30 AM  Result Value Ref Range   Opiates NONE DETECTED NONE DETECTED   Cocaine POSITIVE (A) NONE DETECTED   Benzodiazepines NONE DETECTED NONE DETECTED   Amphetamines NONE DETECTED NONE DETECTED   Tetrahydrocannabinol NONE DETECTED NONE DETECTED    Barbiturates NONE DETECTED NONE DETECTED    Comment: (NOTE) DRUG SCREEN FOR MEDICAL PURPOSES ONLY.  IF CONFIRMATION IS NEEDED FOR ANY PURPOSE, NOTIFY LAB WITHIN 5 DAYS.  LOWEST DETECTABLE LIMITS FOR URINE DRUG SCREEN Drug Class                     Cutoff (ng/mL) Amphetamine and metabolites    1000 Barbiturate and metabolites    200 Benzodiazepine                 200 Opiates and metabolites        300 Cocaine and metabolites        300 THC                            50 Performed at [facility], [address] [place], [place] [address]   Magnesium     Status: None   Collection Time: 01/28/23  7:30 AM  Result Value Ref Range   Magnesium 1.9 1.7 - 2.4 mg/dL    Comment: Performed at [facility], [address], [place], [place] [address]  Urinalysis, Routine w reflex microscopic -Urine, Clean Catch     Status: Abnormal   Collection Time: 01/28/23  7:30 AM  Result Value Ref Range   Color, Urine YELLOW YELLOW   APPearance CLEAR CLEAR   Specific Gravity, Urine 1.010 1.005 - 1.030   pH 5.0 5.0 - 8.0   Glucose, UA NEGATIVE NEGATIVE mg/dL   Hgb urine dipstick MODERATE (A) NEGATIVE   Bilirubin Urine NEGATIVE NEGATIVE   Ketones, ur NEGATIVE NEGATIVE mg/dL   Protein, ur NEGATIVE NEGATIVE mg/dL   Nitrite NEGATIVE NEGATIVE   Leukocytes,Ua NEGATIVE NEGATIVE   RBC / HPF 0-5 0 - 5 RBC/hpf   WBC, UA 0-5 0 - 5 WBC/hpf   Bacteria, UA NONE SEEN NONE SEEN   Squamous Epithelial / HPF 0-5 0 - 5 /HPF   Mucus PRESENT     Comment: Performed at [facility], [address], [place], [place] [address]  Valproic acid level     Status: Abnormal   Collection Time: 01/28/23  7:30 AM  Result Value Ref Range   Valproic Acid Lvl 32 (L) 50.0 - 100.0 ug/mL    Comment: Performed at [facility], [address] [place], [place] [address]    Current Facility-Administered Medications  Medication  Dose Route Frequency Provider  Last Rate Last Admin   albuterol (VENTOLIN HFA) 108 (90 Base) MCG/ACT inhaler 2 puff  2 puff Inhalation Q6H PRN Jacalyn Lefevre, MD       aspirin EC tablet 81 mg  81 mg Oral Daily Jacalyn Lefevre, MD       atorvastatin (LIPITOR) tablet 80 mg  80 mg Oral Daily Jacalyn Lefevre, MD       diclofenac Sodium (VOLTAREN) 1 % topical gel 2 g  2 g Topical QID Jacalyn Lefevre, MD       divalproex (DEPAKOTE) DR tablet 1,500 mg  1,500 mg Oral Daily Jacalyn Lefevre, MD       DULoxetine (CYMBALTA) DR capsule 90 mg  90 mg Oral Daily Jacalyn Lefevre, MD       fluticasone furoate-vilanterol (BREO ELLIPTA) 100-25 MCG/ACT 1 puff  1 puff Inhalation Daily Len Childs T, RPH       And   umeclidinium bromide (INCRUSE ELLIPTA) 62.5 MCG/ACT 1 puff  1 puff Inhalation Daily Len Childs T, RPH       gabapentin (NEURONTIN) capsule 600 mg  600 mg Oral BID Jacalyn Lefevre, MD       hydrOXYzine (ATARAX) tablet 25 mg  25 mg Oral TID PRN Jacalyn Lefevre, MD       LORazepam (ATIVAN) injection 0-4 mg  0-4 mg Intravenous Q6H Jacalyn Lefevre, MD       Or   LORazepam (ATIVAN) tablet 0-4 mg  0-4 mg Oral Q6H Jacalyn Lefevre, MD       Melene Muller ON 01/30/2023] LORazepam (ATIVAN) injection 0-4 mg  0-4 mg Intravenous Q12H Jacalyn Lefevre, MD       Or   Melene Muller ON 01/30/2023] LORazepam (ATIVAN) tablet 0-4 mg  0-4 mg Oral Q12H Jacalyn Lefevre, MD       QUEtiapine (SEROQUEL) tablet 100 mg  100 mg Oral QHS Jacalyn Lefevre, MD       thiamine (VITAMIN B1) tablet 100 mg  100 mg Oral Daily Jacalyn Lefevre, MD       Or   thiamine (VITAMIN B1) injection 100 mg  100 mg Intravenous Daily Jacalyn Lefevre, MD       traZODone (DESYREL) tablet 100 mg  100 mg Oral QHS PRN Jacalyn Lefevre, MD       Current Outpatient Medications  Medication Sig Dispense Refill   albuterol (VENTOLIN HFA) 108 (90 Base) MCG/ACT inhaler Inhale 2 puffs into the lungs every 6 (six) hours as needed for wheezing or shortness of breath. 18 g 6   aspirin EC 81 MG tablet Take 1  tablet (81 mg total) by mouth daily. Swallow whole. 30 tablet 12   atorvastatin (LIPITOR) 80 MG tablet TAKE 1 TABLET BY MOUTH EVERY DAY 90 tablet 3   Budeson-Glycopyrrol-Formoterol (BREZTRI AEROSPHERE) 160-9-4.8 MCG/ACT AERO Inhale 2 puffs into the lungs in the morning and at bedtime.     diclofenac Sodium (VOLTAREN) 1 % GEL Apply 2 g topically 4 (four) times daily. 2 g 0   divalproex (DEPAKOTE) 500 MG DR tablet Take 3 tablets (1,500 mg total) by mouth daily. Take 500mg  in the AM and 1000mg  at night. 90 tablet 2   DULoxetine (CYMBALTA) 30 MG capsule Take 3 capsules (90 mg total) by mouth daily. 90 capsule 3   gabapentin (NEURONTIN) 600 MG tablet Take 1 tablet (600 mg total) by mouth 2 (two) times daily. 60 tablet 2   hydrOXYzine (ATARAX) 25 MG tablet Take 1 tablet (25 mg total) by mouth 3 (  three) times daily as needed for anxiety. 90 tablet 3   nitroGLYCERIN (NITROSTAT) 0.4 MG SL tablet Place 1 tablet (0.4 mg total) under the tongue every 5 (five) minutes as needed for chest pain. 100 tablet 3   QUEtiapine (SEROQUEL) 100 MG tablet Take 1 tablet (100 mg total) by mouth at bedtime. 30 tablet 1   traZODone (DESYREL) 100 MG tablet Take 1 tablet (100 mg total) by mouth at bedtime as needed for sleep. 30 tablet 3   cyclobenzaprine (FLEXERIL) 10 MG tablet Take 1 tablet (10 mg total) by mouth 3 (three) times daily as needed for muscle spasms. (Patient not taking: Reported on 01/28/2023) 50 tablet 0    Musculoskeletal: Strength & Muscle Tone: within normal limits Gait & Station: normal Patient leans: Front   Psychiatric Specialty Exam: Presentation  General Appearance:  Casual; Neat  Eye Contact: Good  Speech: Clear and Coherent; Normal Rate  Speech Volume: Normal  Handedness: Right   Mood and Affect  Mood: Depressed; Hopeless; Worthless  Affect: Congruent; Depressed; Tearful   Thought Process  Thought Processes: Coherent; Goal Directed; Linear  Descriptions of  Associations:Intact  Orientation:Full (Time, Place and Person)  Thought Content:Logical  History of Schizophrenia/Schizoaffective disorder:No data recorded Duration of Psychotic Symptoms:No data recorded Hallucinations:Hallucinations: None  Ideas of Reference:None  Suicidal Thoughts:Suicidal Thoughts: Yes, Active SI Active Intent and/or Plan: With Intent; With Plan; With Means to Carry Out; With Access to Means  Homicidal Thoughts:Homicidal Thoughts: No   Sensorium  Memory: Immediate Good; Recent Good; Remote Good  Judgment: Intact  Insight: Present   Executive Functions  Concentration: Good  Attention Span: Good  Recall: Good  Fund of Knowledge: Good  Language: Good   Psychomotor Activity  Psychomotor Activity: Psychomotor Activity: Tremor   Assets  Assets: Communication Skills; Desire for Improvement; Housing; Social Support    Sleep  Sleep: Sleep: Fair   Physical Exam: Physical Exam Vitals and nursing note reviewed.  Constitutional:      Appearance: Normal appearance.  HENT:     Head: Normocephalic.     Nose: Nose normal.  Cardiovascular:     Rate and Rhythm: Normal rate and regular rhythm.  Pulmonary:     Effort: Pulmonary effort is normal.  Musculoskeletal:        General: Normal range of motion.     Cervical back: Normal range of motion.  Skin:    General: Skin is warm and dry.  Neurological:     Mental Status: He is alert and oriented to person, place, and time.  Psychiatric:        Attention and Perception: Attention and perception normal.        Mood and Affect: Mood is depressed. Affect is angry and tearful.        Speech: Speech normal.        Behavior: Behavior normal. Behavior is cooperative.        Thought Content: Thought content includes suicidal ideation. Thought content includes suicidal plan.        Cognition and Memory: Cognition and memory normal.    Review of Systems  Constitutional: Negative.   HENT:  Negative.    Eyes: Negative.   Respiratory: Negative.    Cardiovascular: Negative.   Gastrointestinal: Negative.   Genitourinary: Negative.   Musculoskeletal: Negative.   Skin: Negative.   Neurological: Negative.   Endo/Heme/Allergies: Negative.   Psychiatric/Behavioral:  Positive for depression, substance abuse and suicidal ideas. The patient has insomnia.    Blood pressure 119/84,  pulse 65, temperature 98.4 F (36.9 C), temperature source Oral, resp. rate (!) 22, height 6\' 3"  (1.905 m), weight 95.3 kg, SpO2 98 %. Body mass index is 26.25 kg/m.  Medical Decision Making: Patient remains suicidal with a plan.  He has previous suicide attempt hx.  He feels hopeless and helpless.  We will seek bed placement at any facility with available bed ane we will resume home medications.  Patient denies HI/AVH and no mention of Paranoia.  He is on CIWA protocol with Ativan protocol.  His home Medications are resumed pending bed availability  Problem 1: Recurrent Major Depressive disorder, severe without Psychotic features.  Problem 2: Suicide ideation  Problem 3: Suicide induced Mood disorder  Disposition:  admit, seek inpatient hospitalization.  Earney Navy, NP-PMHNP-BC 01/28/2023 12:19 PM

## 2023-01-28 NOTE — Progress Notes (Signed)
[facility] LCSW Progress Note   01/28/2023    6:18 PM  Keith Gibbs   [id number]   Type of Contact and Topic:  Psychiatric Bed Placement   Pt accepted to [phone]    Patient meets inpatient criteria per Dahlia Byes, NP    The attending provider will be Dr. Sherron Flemings   Call report to [phone]    [name], [name] notified.     Pt scheduled  to arrive at TODAY at 2130.   Damita Dunnings, MSW, LCSW-A  6:23 PM 01/28/2023

## 2023-01-29 ENCOUNTER — Other Ambulatory Visit: Payer: Self-pay

## 2023-01-29 ENCOUNTER — Encounter (HOSPITAL_COMMUNITY): Payer: Self-pay | Admitting: Nurse Practitioner

## 2023-01-29 DIAGNOSIS — F332 Major depressive disorder, recurrent severe without psychotic features: Principal | ICD-10-CM

## 2023-01-29 MED ORDER — ENSURE ENLIVE PO LIQD
237.0000 mL | Freq: Two times a day (BID) | ORAL | Status: DC
  Administered 2023-01-29 – 2023-02-02 (×10): 237 mL via ORAL
  Filled 2023-01-29 (×13): qty 237

## 2023-01-29 MED ORDER — BUDESON-GLYCOPYRROL-FORMOTEROL 160-9-4.8 MCG/ACT IN AERO
2.0000 | INHALATION_SPRAY | Freq: Two times a day (BID) | RESPIRATORY_TRACT | Status: DC
  Administered 2023-01-29 – 2023-02-03 (×10): 2 via RESPIRATORY_TRACT
  Filled 2023-01-29 (×13): qty 0.42

## 2023-01-29 MED ORDER — NICOTINE 14 MG/24HR TD PT24
14.0000 mg | MEDICATED_PATCH | Freq: Every day | TRANSDERMAL | Status: DC
  Administered 2023-01-29 – 2023-02-03 (×6): 14 mg via TRANSDERMAL
  Filled 2023-01-29: qty 1
  Filled 2023-01-29: qty 14
  Filled 2023-01-29 (×7): qty 1

## 2023-01-29 MED ORDER — DIAZEPAM 5 MG PO TABS
10.0000 mg | ORAL_TABLET | Freq: Three times a day (TID) | ORAL | Status: DC
  Administered 2023-01-29 – 2023-01-30 (×4): 10 mg via ORAL
  Filled 2023-01-29 (×4): qty 2

## 2023-01-29 MED ORDER — NICOTINE POLACRILEX 2 MG MT GUM: 2.0000 mg | CHEWING_GUM | OROMUCOSAL | Status: DC | PRN

## 2023-01-29 NOTE — [facility] Suicide Risk Assessment (Signed)
Suicide Risk Assessment  Admission Assessment    [facility] Admission Suicide Risk Assessment   Nursing information obtained from:  Patient Demographic factors:  Male, Caucasian, Unemployed, Low socioeconomic status Current Mental Status:  Suicidal ideation indicated by patient, Suicide plan Loss Factors:  Financial problems / change in socioeconomic status, Loss of significant relationship Historical Factors:    Risk Reduction Factors:  NA  Total Time spent with patient: 15 minutes Principal Problem: Major depressive disorder, recurrent severe without psychotic features (HCC) Diagnosis:  Principal Problem:   Major depressive disorder, recurrent severe without psychotic features (HCC)  Subjective Data: Keith Gibbs is a 62 year old male with a past psychiatric history of schizoaffective, cocaine use disorder, alcohol use disorder; numerous hospitalizations due to substance intoxication/withdrawal, suicidal ideation/attempts, mania, psychosis; numerous suicide attempts including this January.  He presents to be behavioral health Hospital from the ED for suicidal ideation with intent and plan (hanging or slit her wrist or OD).  Continued Clinical Symptoms:  Alcohol Use Disorder Identification Test Final Score (AUDIT): 4 The "Alcohol Use Disorders Identification Test", Guidelines for Use in Primary Care, Second Edition.  World Science writer [facility]). Score between 0-7:  no or low risk or alcohol related problems. Score between 8-15:  moderate risk of alcohol related problems. Score between 16-19:  high risk of alcohol related problems. Score 20 or above:  warrants further diagnostic evaluation for alcohol dependence and treatment.   CLINICAL FACTORS:   Depression:   Anhedonia Comorbid alcohol abuse/dependence Hopelessness Severe Alcohol/Substance Abuse/Dependencies More than one psychiatric diagnosis Unstable or Poor Therapeutic Relationship   Musculoskeletal: Strength & Muscle  Tone: within normal limits Gait & Station: normal Patient leans: N/A  Psychiatric Specialty Exam:  Presentation  General Appearance:  Appropriate for Environment  Eye Contact: Fleeting  Speech: Normal Rate  Speech Volume: Normal  Handedness: Right   Mood and Affect  Mood: Depressed; Hopeless  Affect: Depressed; Flat; Congruent; Tearful   Thought Process  Thought Processes: Coherent  Descriptions of Associations:Intact  Orientation:Full (Time, Place and Person)  Thought Content:Logical  History of Schizophrenia/Schizoaffective disorder:No data recorded Duration of Psychotic Symptoms:No data recorded Hallucinations:Hallucinations: None  Ideas of Reference:None  Suicidal Thoughts:Suicidal Thoughts: Yes, Active SI Active Intent and/or Plan: With Plan; With Intent  Homicidal Thoughts:Homicidal Thoughts: No   Sensorium  Memory: Immediate Good  Judgment: Fair (poor in that will commit suicide, fair in that he presented to hospital)  Insight: Fair (knows he sick and recognized his need for hospitalization)   Executive Functions  Concentration: Good  Attention Span: Good  Recall: Good  Fund of Knowledge: Good  Language: Good   Psychomotor Activity  Psychomotor Activity: Psychomotor Activity: Normal   Assets  Assets: Housing; Desire for Improvement   Sleep  Sleep: Sleep: Fair Number of Hours of Sleep: 6    Physical Exam: Physical Exam Vitals and nursing note reviewed.  HENT:     Head: Normocephalic and atraumatic.     Comments: Hearing impaired Pulmonary:     Effort: Pulmonary effort is normal.  Neurological:     Mental Status: He is alert.     Motor: Tremor present.     Comments: Tremors at baseline    Review of Systems  Constitutional:  Positive for diaphoresis. Negative for fever.  Cardiovascular:  Negative for chest pain and palpitations.  Gastrointestinal:  Positive for diarrhea and nausea. Negative for  constipation and vomiting.  Neurological:  Negative for dizziness, weakness and headaches.  Psychiatric/Behavioral:  Positive for depression and suicidal ideas.  Negative for hallucinations.    Blood pressure 116/75, pulse (!) 55, temperature 97.7 F (36.5 C), temperature source Oral, resp. rate 16, height 6\' 3"  (1.905 m), weight 94.9 kg, SpO2 98 %. Body mass index is 26.15 kg/m.   COGNITIVE FEATURES THAT CONTRIBUTE TO RISK:  Polarized thinking    SUICIDE RISK:   Extreme:  Frequent, intense, and enduring suicidal ideation, specific plans, clear subjective and objective intent, impaired self-control, severe dysphoria/symptomatology, many risk factors and no protective factors.   PLAN OF CARE:    Patient IVC due to concerns for safety.  No clear medication recommendations that would improve this presentation as he is already already titrated to high dose on duloxetine and followed outpatient.  Acute presentation appears to be more related to psychosocial issues including housing, financial issues, and relationship issues.  Furthermore he is experiencing alcohol withdrawal.  He is on CIWA protocol but we also will schedule Valium 10mg  3x daily and taper.  I will talk to his mother tomorrow about solutions.  I will also discuss [facility] with him which would support his substance use remission, provide social support, and provide housing.  Patient would likely benefit from group therapy sessions while inpatient, and this is something he identified as wanting on a regular basis.   PLAN: Safety and Monitoring:             -- Involuntary admission to inpatient psychiatric unit for safety, stabilization and treatment             -- Daily contact with patient to assess and evaluate symptoms and progress in treatment             -- Patient's case to be discussed in multi-disciplinary team meeting             -- Observation Level : q15 minute checks             -- Vital signs:  q12 hours              -- Precautions: suicide, elopement, and assault   2. Psychiatric Diagnoses and Treatment:  -- Continue Depakote 500 3 times daily for mood stabilization  -- Duloxetine 30 3 times daily for depression --Seroquel 100 and trazodone 100 at night for sleep -- Hydroxyzine 25 as needed --  The risks/benefits/side-effects/alternatives to this medication were discussed in detail with the patient and time was given for questions. The patient consents to medication trial.              -- Metabolic profile and EKG monitoring obtained while on an atypical antipsychotic (BMI, Lipid Panel, HbgA1c, QTc). See #4 below for values.              -- Encouraged patient to participate in unit milieu and in scheduled group therapies              -- Short Term Goals: Ability to identify changes in lifestyle to reduce recurrence of condition will improve, Ability to verbalize feelings will improve, Ability to disclose and discuss suicidal ideas, Ability to demonstrate self-control will improve, Ability to identify and develop effective coping behaviors will improve, Ability to maintain clinical measurements within normal limits will improve, Compliance with prescribed medications will improve, and Ability to identify triggers associated with substance abuse/mental health issues will improve             -- Long Term Goals: Improvement in symptoms so as ready for discharge  3. Medical Issues Being Addressed:              -- Alcohol withdrawal: CIWA protocol and scheduled Valium 10 3 times daily and taper.  -- Nicotine withdrawal: 14 mg patch once daily, Nicorette gum as needed    4. Routine and other pertinent labs reviewed: EKG monitoring: QTc: 462  Metabolism / endocrine: BMI: Body mass index is 26.15 kg/m. Lipid Panel: Lab Results  Component Value Date   CHOL 83 04/26/2022   TRIG 52 04/26/2022   HDL 28 (L) 04/26/2022   CHOLHDL 3.0 04/26/2022   VLDL 10 04/26/2022   LDLCALC 45 04/26/2022    LDLCALC 41 08/31/2021   HbgA1c: Hgb A1c MFr Bld (%)  Date Value  04/26/2022 5.2   TSH: TSH (uIU/mL)  Date Value  08/03/2022 0.420  09/08/2020 1.610    Labs to order:   5. Discharge Planning:              -- Social work and case management to assist with discharge planning and identification of hospital follow-up needs prior to discharge             -- Estimated LOS: 7-10 days              -- Discharge Concerns: Need to establish a safety plan; Medication compliance and effectiveness             -- Discharge Goals: Return home with outpatient referrals for mental health follow-up including medication management/psychotherapy    I certify that inpatient services furnished can reasonably be expected to improve the patient's condition.   Meryl Dare, MD 01/29/2023, 3:44 PM

## 2023-01-29 NOTE — [facility] Group Notes (Signed)
The focus of this group is to help patients establish daily goals to achieve during treatment and discuss how the patient can incorporate goal setting into their daily lives to aide in recovery.  Pt attended group. Pt goal is to manage thoughts of self-harm.

## 2023-01-29 NOTE — Group Note (Signed)
Date:  01/29/2023 Time:  3:20 PM  Group Topic/Focus:   Developing a Wellness Toolbox:   The focus of this group is to help patients develop a "wellness toolbox" with skills and strategies to promote recovery upon discharge. Skills and strategies discussed included eating healthy, exercise, maintaining appropriate physical health, keeping the brain stimulated with reading, puzzles, intellectual conversations, and being social.     Participation Level:  Did Not Attend  Participation Quality:   Did not attend  Affect:   Did not attend  Cognitive:   Did not attend  Insight: None  Engagement in Group:   Did not attend  Modes of Intervention:   Did not attend  Additional Comments:  n/a  Cherre Blanc 01/29/2023, 3:20 PM

## 2023-01-29 NOTE — Plan of Care (Signed)
  Problem: Education: Goal: Knowledge of [place] [facility] Education information/materials will improve Outcome: Progressing   Problem: Activity: Goal: Interest or engagement in activities will improve Outcome: Progressing   Problem: Education: Goal: Ability to make informed decisions regarding treatment will improve Outcome: Progressing   Problem: Education: Goal: Ability to state activities that reduce stress will improve Outcome: Progressing   Problem: Education: Goal: Knowledge of disease or condition will improve Outcome: Progressing   Problem: Education: Goal: Knowledge of [place] [facility] Education information/materials will improve Outcome: Progressing

## 2023-01-29 NOTE — Progress Notes (Signed)
D: Patient alert and oriented, able to make needs known. Denies SI/HI, AVH at present. Denies pain at present. Patient goal today "not want to die." Rates depression 8/10, hopelessness 9/10, and anxiety 10/10. Patient reports energy level as poor. He reports he slept fair last night. Patient does not request any PRN medication at this time. CIWA assessments continue q6h.   A: Scheduled medications administered to patient per MD order. Support and encouragement provided. Routine safety checks conducted every fifteen minutes. Patient informed to notify staff with problems or concerns. Frequent verbal contact made.   R: No adverse drug reactions noted. Patient contracts for safety at this time. Patient is compliant with medications and treatment plan. Patient receptive, calm and cooperative. Patient interacts with others appropriately on unit at present. Patient remains safe at present.

## 2023-01-29 NOTE — Group Note (Signed)
Date:  01/29/2023 Time:  10:22 PM  Group Topic/Focus:  Wrap-Up Group:   The focus of this group is to help patients review their daily goal of treatment and discuss progress on daily workbooks.    Participation Level:  Active  Participation Quality:  Appropriate and Sharing  Affect:  Appropriate  Cognitive:  Alert and Appropriate  Insight: Appropriate  Engagement in Group:  Engaged  Modes of Intervention:  Activity and Exploration  Additional Comments: None  Tacy Dura 01/29/2023, 10:22 PM

## 2023-01-29 NOTE — Plan of Care (Signed)
  Problem: Education: Goal: Utilization of techniques to improve thought processes will improve Outcome: Progressing   Problem: Coping: Goal: Coping ability will improve Outcome: Progressing   Problem: Education: Goal: Ability to make informed decisions regarding treatment will improve Outcome: Progressing   Problem: Medication: Goal: Compliance with prescribed medication regimen will improve Outcome: Progressing   Problem: Self-Concept: Goal: Ability to disclose and discuss suicidal ideas will improve Outcome: Progressing Goal: Will verbalize positive feelings about self Outcome: Progressing

## 2023-01-29 NOTE — Tx Team (Signed)
Initial Treatment Plan 01/29/2023 2:19 AM [name]    PATIENT STRESSORS: Financial difficulties   Marital or family conflict   Traumatic event     PATIENT STRENGTHS: Ability for insight  Financial means  General fund of knowledge    PATIENT IDENTIFIED PROBLEMS: Suicide attempt  Polysubstance abuse  Depression  Anxiety  "Me"  "Live on life's terms"           DISCHARGE CRITERIA:  Ability to meet basic life and health needs Improved stabilization in mood, thinking, and/or behavior Reduction of life-threatening or endangering symptoms to within safe limits Safe-care adequate arrangements made Verbal commitment to aftercare and medication compliance  PRELIMINARY DISCHARGE PLAN: Attend aftercare/continuing care group Outpatient therapy Participate in family therapy Return to previous living arrangement  PATIENT/FAMILY INVOLVEMENT: This treatment plan has been presented to and reviewed with the patient, Keith Gibbs, and/or family member.  The patient and family have been given the opportunity to ask questions and make suggestions.  Wendie Simmer, RN 01/29/2023, 2:19 AM

## 2023-01-29 NOTE — Plan of Care (Signed)
  Problem: Education: Goal: Knowledge of [place] [facility] Education information/materials will improve Outcome: Progressing   Problem: Education: Goal: Mental status will improve Outcome: Progressing   Problem: Education: Goal: Utilization of techniques to improve thought processes will improve Outcome: Progressing   Problem: Health Behavior/Discharge Planning: Goal: Compliance with therapeutic regimen will improve Outcome: Progressing   Problem: Coping: Goal: Coping ability will improve Outcome: Progressing
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
## 2023-01-29 NOTE — Progress Notes (Signed)
   01/29/23 0626  15 Minute Checks  Location Dayroom  Visual Appearance Calm  Behavior Composed  Sleep (Behavioral Health Patients Only)  Calculate sleep? (Click Yes once per 24 hr at 0600 safety check) Yes  Documented sleep last 24 hours 5.25

## 2023-01-29 NOTE — Progress Notes (Signed)
   01/29/23 2000  Psych Admission Type (Psych Patients Only)  Admission Status Voluntary  Psychosocial Assessment  Patient Complaints Anxiety;Depression  Eye Contact Fair  Facial Expression Anxious  Affect Flat  Speech Logical/coherent  Interaction Assertive  Motor Activity Slow  Appearance/Hygiene In scrubs;Improved  Behavior Characteristics Appropriate to situation  Mood Depressed;Anxious  Aggressive Behavior  Targets Self  Effect No apparent injury  Thought Process  Coherency Circumstantial  Content Blaming self;Blaming others  Delusions None reported or observed  Perception WDL  Hallucination None reported or observed  Judgment Impaired  Confusion None  Danger to Self  Current suicidal ideation? Denies  Self-Injurious Behavior No self-injurious ideation or behavior indicators observed or expressed   Agreement Not to Harm Self Yes  Description of Agreement Verbal  Danger to Others  Danger to Others None reported or observed
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
## 2023-01-30 ENCOUNTER — Encounter (HOSPITAL_COMMUNITY): Payer: Self-pay

## 2023-01-30 MED ORDER — GABAPENTIN 300 MG PO CAPS
600.0000 mg | ORAL_CAPSULE | Freq: Three times a day (TID) | ORAL | Status: DC
  Administered 2023-01-30 – 2023-02-03 (×13): 600 mg via ORAL
  Filled 2023-01-30 (×3): qty 2
  Filled 2023-01-30: qty 84
  Filled 2023-01-30 (×6): qty 2
  Filled 2023-01-30: qty 84
  Filled 2023-01-30 (×7): qty 2
  Filled 2023-01-30: qty 84

## 2023-01-30 MED ORDER — DULOXETINE HCL 30 MG PO CPEP
30.0000 mg | ORAL_CAPSULE | Freq: Three times a day (TID) | ORAL | Status: DC
  Administered 2023-01-30 – 2023-02-03 (×13): 30 mg via ORAL
  Filled 2023-01-30 (×5): qty 1
  Filled 2023-01-30: qty 42
  Filled 2023-01-30: qty 1
  Filled 2023-01-30: qty 42
  Filled 2023-01-30 (×3): qty 1
  Filled 2023-01-30: qty 42
  Filled 2023-01-30 (×7): qty 1

## 2023-01-30 MED ORDER — DIAZEPAM 5 MG PO TABS
10.0000 mg | ORAL_TABLET | Freq: Two times a day (BID) | ORAL | Status: DC
  Administered 2023-01-30 – 2023-01-31 (×2): 10 mg via ORAL
  Filled 2023-01-30 (×2): qty 2

## 2023-01-30 MED ORDER — HYDROXYZINE HCL 25 MG PO TABS
25.0000 mg | ORAL_TABLET | Freq: Three times a day (TID) | ORAL | Status: DC
  Administered 2023-01-30 – 2023-01-31 (×5): 25 mg via ORAL
  Filled 2023-01-30 (×10): qty 1

## 2023-01-30 MED ORDER — DIVALPROEX SODIUM 500 MG PO DR TAB
500.0000 mg | DELAYED_RELEASE_TABLET | Freq: Every day | ORAL | Status: DC
  Administered 2023-01-31 – 2023-02-03 (×4): 500 mg via ORAL
  Filled 2023-01-30 (×6): qty 1
  Filled 2023-01-30: qty 14

## 2023-01-30 MED ORDER — LORAZEPAM 1 MG PO TABS: 1.0000 mg | ORAL_TABLET | Freq: Four times a day (QID) | ORAL | Status: DC | PRN

## 2023-01-30 MED ORDER — ATORVASTATIN CALCIUM 40 MG PO TABS
80.0000 mg | ORAL_TABLET | Freq: Every day | ORAL | Status: DC
  Administered 2023-01-30 – 2023-02-02 (×4): 80 mg via ORAL
  Filled 2023-01-30 (×2): qty 1
  Filled 2023-01-30: qty 28
  Filled 2023-01-30 (×3): qty 1

## 2023-01-30 MED ORDER — DIVALPROEX SODIUM 500 MG PO DR TAB
1000.0000 mg | DELAYED_RELEASE_TABLET | Freq: Every day | ORAL | Status: DC
  Filled 2023-01-30: qty 2

## 2023-01-30 NOTE — BH IP Treatment Plan (Signed)
Interdisciplinary Treatment and Diagnostic Plan Update  01/30/2023 Time of Session: 10:55 AM  Keith Gibbs MRN: [id number]  Principal Diagnosis: Major depressive disorder, recurrent severe without psychotic features (HCC)  Secondary Diagnoses: Principal Problem:   Major depressive disorder, recurrent severe without psychotic features (HCC)   Current Medications:  Current Facility-Administered Medications  Medication Dose Route Frequency Provider Last Rate Last Admin   acetaminophen (TYLENOL) tablet 650 mg  650 mg Oral Q6H PRN Dahlia Byes C, NP   650 mg at 01/30/23 1124   albuterol (VENTOLIN HFA) 108 (90 Base) MCG/ACT inhaler 2 puff  2 puff Inhalation Q6H PRN Earney Navy, NP       alum & mag hydroxide-simeth (MAALOX/MYLANTA) 200-200-20 MG/5ML suspension 30 mL  30 mL Oral Q4H PRN Earney Navy, NP       aspirin EC tablet 81 mg  81 mg Oral Daily Onuoha, Josephine C, NP   81 mg at 01/30/23 0827   atorvastatin (LIPITOR) tablet 80 mg  80 mg Oral QHS Meryl Dare, MD       Budeson-Glycopyrrol-Formoterol 160-9-4.8 MCG/ACT AERO 2 puff  2 puff Inhalation BID Phineas Inches, MD   2 puff at 01/30/23 0827   diazepam (VALIUM) tablet 10 mg  10 mg Oral TID Neville Route L, DO   10 mg at 01/30/23 1124   diclofenac Sodium (VOLTAREN) 1 % topical gel 2 g  2 g Topical QID Dahlia Byes C, NP   2 g at 01/30/23 1308   diphenhydrAMINE (BENADRYL) capsule 50 mg  50 mg Oral TID PRN Earney Navy, NP       Or   diphenhydrAMINE (BENADRYL) injection 50 mg  50 mg Intramuscular TID PRN Dahlia Byes C, NP       divalproex (DEPAKOTE) DR tablet 1,000 mg  1,000 mg Oral QHS Dahlia Byes C, NP   1,000 mg at 01/29/23 2119   divalproex (DEPAKOTE) DR tablet 500 mg  500 mg Oral Daily Dahlia Byes C, NP   500 mg at 01/30/23 6578   DULoxetine (CYMBALTA) DR capsule 30 mg  30 mg Oral TID with meals Meryl Dare, MD   30 mg at 01/30/23 1124   feeding supplement (ENSURE ENLIVE  / ENSURE PLUS) liquid 237 mL  237 mL Oral BID BM Bobbitt, Shalon E, NP   237 mL at 01/30/23 1125   gabapentin (NEURONTIN) capsule 600 mg  600 mg Oral TID Meryl Dare, MD   600 mg at 01/30/23 1124   haloperidol (HALDOL) tablet 5 mg  5 mg Oral TID PRN Earney Navy, NP       Or   haloperidol lactate (HALDOL) injection 5 mg  5 mg Intramuscular TID PRN Earney Navy, NP       hydrOXYzine (ATARAX) tablet 25 mg  25 mg Oral TID with meals Meryl Dare, MD   25 mg at 01/30/23 1124   LORazepam (ATIVAN) tablet 2 mg  2 mg Oral TID PRN Dahlia Byes C, NP       Or   LORazepam (ATIVAN) injection 2 mg  2 mg Intramuscular TID PRN Dahlia Byes C, NP       LORazepam (ATIVAN) tablet 0-4 mg  0-4 mg Oral Q6H Onuoha, Josephine C, NP   1 mg at 01/30/23 0530   LORazepam (ATIVAN) tablet 0-4 mg  0-4 mg Oral Q12H Onuoha, Josephine C, NP       magnesium hydroxide (MILK OF MAGNESIA) suspension 30 mL  30 mL Oral Daily PRN  Earney Navy, NP       nicotine (NICODERM CQ - dosed in mg/24 hours) patch 14 mg  14 mg Transdermal Daily Meryl Dare, MD   14 mg at 01/30/23 0825   nicotine polacrilex (NICORETTE) gum 2 mg  2 mg Oral PRN Meryl Dare, MD       QUEtiapine (SEROQUEL) tablet 100 mg  100 mg Oral QHS Onuoha, Josephine C, NP   100 mg at 01/29/23 2119   thiamine (Vitamin B-1) tablet 100 mg  100 mg Oral Daily Dahlia Byes C, NP   100 mg at 01/30/23 1610   traZODone (DESYREL) tablet 100 mg  100 mg Oral QHS PRN Dahlia Byes C, NP   100 mg at 01/29/23 2121   PTA Medications: Medications Prior to Admission  Medication Sig Dispense Refill Last Dose   albuterol (VENTOLIN HFA) 108 (90 Base) MCG/ACT inhaler Inhale 2 puffs into the lungs every 6 (six) hours as needed for wheezing or shortness of breath. 18 g 6    aspirin EC 81 MG tablet Take 1 tablet (81 mg total) by mouth daily. Swallow whole. 30 tablet 12    atorvastatin (LIPITOR) 80 MG tablet TAKE 1 TABLET BY MOUTH EVERY DAY 90 tablet 3     Budeson-Glycopyrrol-Formoterol (BREZTRI AEROSPHERE) 160-9-4.8 MCG/ACT AERO Inhale 2 puffs into the lungs in the morning and at bedtime.      cyclobenzaprine (FLEXERIL) 10 MG tablet Take 1 tablet (10 mg total) by mouth 3 (three) times daily as needed for muscle spasms. (Patient not taking: Reported on 01/28/2023) 50 tablet 0    diclofenac Sodium (VOLTAREN) 1 % GEL Apply 2 g topically 4 (four) times daily. 2 g 0    divalproex (DEPAKOTE) 500 MG DR tablet Take 3 tablets (1,500 mg total) by mouth daily. Take 500mg  in the AM and 1000mg  at night. 90 tablet 2    DULoxetine (CYMBALTA) 30 MG capsule Take 3 capsules (90 mg total) by mouth daily. 90 capsule 3    gabapentin (NEURONTIN) 600 MG tablet Take 1 tablet (600 mg total) by mouth 2 (two) times daily. 60 tablet 2    hydrOXYzine (ATARAX) 25 MG tablet Take 1 tablet (25 mg total) by mouth 3 (three) times daily as needed for anxiety. 90 tablet 3    nitroGLYCERIN (NITROSTAT) 0.4 MG SL tablet Place 1 tablet (0.4 mg total) under the tongue every 5 (five) minutes as needed for chest pain. 100 tablet 3    QUEtiapine (SEROQUEL) 100 MG tablet Take 1 tablet (100 mg total) by mouth at bedtime. 30 tablet 1    traZODone (DESYREL) 100 MG tablet Take 1 tablet (100 mg total) by mouth at bedtime as needed for sleep. 30 tablet 3     Patient Stressors: Financial difficulties   Marital or family conflict   Traumatic event    Patient Strengths: Ability for Nurse, mental health fund of knowledge   Treatment Modalities: Medication Management, Group therapy, Case management,  1 to 1 session with clinician, Psychoeducation, Recreational therapy.   Physician Treatment Plan for Primary Diagnosis: Major depressive disorder, recurrent severe without psychotic features (HCC) Long Term Goal(s):     Short Term Goals:    Medication Management: Evaluate patient's response, side effects, and tolerance of medication regimen.  Therapeutic Interventions: 1 to 1  sessions, Unit Group sessions and Medication administration.  Evaluation of Outcomes: Not Progressing  Physician Treatment Plan for Secondary Diagnosis: Principal Problem:   Major depressive disorder, recurrent severe without  psychotic features (HCC)  Long Term Goal(s):     Short Term Goals:       Medication Management: Evaluate patient's response, side effects, and tolerance of medication regimen.  Therapeutic Interventions: 1 to 1 sessions, Unit Group sessions and Medication administration.  Evaluation of Outcomes: Not Progressing   RN Treatment Plan for Primary Diagnosis: Major depressive disorder, recurrent severe without psychotic features (HCC) Long Term Goal(s): Knowledge of disease and therapeutic regimen to maintain health will improve  Short Term Goals: Ability to remain free from injury will improve, Ability to verbalize frustration and anger appropriately will improve, Ability to demonstrate self-control, Ability to participate in decision making will improve, Ability to verbalize feelings will improve, Ability to disclose and discuss suicidal ideas, Ability to identify and develop effective coping behaviors will improve, and Compliance with prescribed medications will improve  Medication Management: RN will administer medications as ordered by provider, will assess and evaluate patient's response and provide education to patient for prescribed medication. RN will report any adverse and/or side effects to prescribing provider.  Therapeutic Interventions: 1 on 1 counseling sessions, Psychoeducation, Medication administration, Evaluate responses to treatment, Monitor vital signs and CBGs as ordered, Perform/monitor CIWA, COWS, AIMS and Fall Risk screenings as ordered, Perform wound care treatments as ordered.  Evaluation of Outcomes: Not Progressing   LCSW Treatment Plan for Primary Diagnosis: Major depressive disorder, recurrent severe without psychotic features (HCC) Long  Term Goal(s): Safe transition to appropriate next level of care at discharge, Engage patient in therapeutic group addressing interpersonal concerns.  Short Term Goals: Engage patient in aftercare planning with referrals and resources, Increase social support, Increase ability to appropriately verbalize feelings, Increase emotional regulation, Facilitate acceptance of mental health diagnosis and concerns, Facilitate patient progression through stages of change regarding substance use diagnoses and concerns, Identify triggers associated with mental health/substance abuse issues, and Increase skills for wellness and recovery  Therapeutic Interventions: Assess for all discharge needs, 1 to 1 time with Social worker, Explore available resources and support systems, Assess for adequacy in community support network, Educate family and significant other(s) on suicide prevention, Complete Psychosocial Assessment, Interpersonal group therapy.  Evaluation of Outcomes: Not Progressing   Progress in Treatment: Attending groups: Yes. Participating in groups: Yes. Taking medication as prescribed: Yes. Toleration medication: Yes. Family/Significant other contact made: No, will contact:  Whoever pt gives CSW permission to contact Patient understands diagnosis: No. Discussing patient identified problems/goals with staff: Yes. Medical problems stabilized or resolved: Yes. Denies suicidal/homicidal ideation: No. Patient mentioned that he did not want to be on this earth anymore Issues/concerns per patient self-inventory: No.   New problem(s) identified: No, Describe:  None reported   New Short Term/Long Term Goal(s):detox, medication management for mood stabilization; elimination of SI thoughts; development of comprehensive mental wellness/sobriety plan   Patient Goals:  " I need my life back in order and learn coping skills "   Discharge Plan or Barriers: Patient recently admitted. CSW will continue to  follow and assess for appropriate referrals and possible discharge planning.    Reason for Continuation of Hospitalization: Anxiety Depression Medication stabilization Suicidal ideation Withdrawal symptoms  Estimated Length of Stay: 3-5 days   Last 3 [place] Suicide Severity Risk Score: Flowsheet Row [facility] Most recent reading at 01/29/2023 12:28 AM ED from 01/28/2023 in [facility] Department at [facility] Most recent reading at 01/28/2023  7:28 AM Admission (Discharged) from 08/23/2022 in [place]  [facility] Most recent reading at 08/26/2022  1:30 AM  C-SSRS RISK CATEGORY High Risk High Risk Error: Q3, 4, or 5 should not be populated when Q2 is No       Last PHQ 2/9 Scores:    12/13/2022    9:29 AM 09/05/2022   10:39 AM 08/01/2022    8:52 AM  Depression screen PHQ 2/9  Decreased Interest 0 0 3  Down, Depressed, Hopeless 0 0 3  PHQ - 2 Score 0 0 6  Altered sleeping 0  3  Tired, decreased energy 0  0  Change in appetite 0  1  Feeling bad or failure about yourself  0  3  Trouble concentrating 0  3  Moving slowly or fidgety/restless 0  3  Suicidal thoughts 0  3  PHQ-9 Score 0  22  Difficult doing work/chores   Very difficult    Scribe for Treatment Team: Beather Arbour 01/30/2023 12:13 PM

## 2023-01-30 NOTE — BHH Group Notes (Signed)
Adult Psychoeducational Group Note  Date:  01/30/2023 Time:  10:20 PM  Group Topic/Focus:  Wrap-Up Group:   The focus of this group is to help patients review their daily goal of treatment and discuss progress on daily workbooks.  Participation Level:  Active  Participation Quality:  Attentive  Affect:  Appropriate  Cognitive:  Alert  Insight: Good  Engagement in Group:  Engaged  Modes of Intervention:  Discussion and Support  Additional Comments:  Pt attended and participated in AA group.  Maura Crandall Cassandra 01/30/2023, 10:20 PM

## 2023-01-30 NOTE — Group Note (Signed)
Recreation Therapy Group Note   Group Topic:Team Building  Group Date: 01/30/2023 Start Time: 0930 End Time: 1005 Facilitators: Kenden Brandt-McCall, LRT,CTRS Location: [address] Dayroom   Goal Area(s) Addresses:  Patient will effectively work with peer towards shared goal.  Patient will identify skills used to make activity successful.  Patient will identify how skills used during activity can be applied to reach post d/c goals.   Group Description: [employer]. In teams of 5-6, patients were given 11 craft pipe cleaners. Using the materials provided, patients were instructed to compete again the opposing team(s) to build the tallest free-standing structure from floor level. The activity was timed; difficulty increased by Clinical research associate as Production designer, theatre/television/film continued.  Systematically resources were removed with additional directions for example, placing one arm behind their back, working in silence, and shape stipulations. LRT facilitated post-activity discussion reviewing team processes and necessary communication skills involved in completion. Patients were encouraged to reflect how the skills utilized, or not utilized, in this activity can be incorporated to positively impact support systems post discharge.   Affect/Mood: Appropriate   Participation Level: Engaged   Participation Quality: Independent   Behavior: Appropriate   Speech/Thought Process: Focused   Insight: Good   Judgement: Good   Modes of Intervention: STEM Activity   Patient Response to Interventions:  Engaged   Education Outcome:  Acknowledges education   Clinical Observations/Individualized Feedback: Pt was late coming to group but was able to assist peers in completing their tower.    Plan: Continue to engage patient in RT group sessions 2-3x/week.   Cadi Rhinehart-McCall, LRT,CTRS 01/30/2023 12:01 PM
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
## 2023-01-30 NOTE — BHH Group Notes (Signed)
Adult Psychoeducational Group Note  Date:  01/30/2023 Time:  4:18 PM  Group Topic/Focus:  Coping With Mental Health Crisis:   The purpose of this group is to help patients identify strategies for coping with mental health crisis.  Group discusses possible causes of crisis and ways to manage them effectively.  Participation Level:  Active  Participation Quality:  Appropriate  Affect:  Appropriate  Cognitive:  Appropriate  Insight: Good  Engagement in Group:  Engaged  Modes of Intervention:  Discussion  Additional Comments:    Lucilla Edin 01/30/2023, 4:18 PM

## 2023-01-30 NOTE — Progress Notes (Signed)
   01/30/23 1000  Psych Admission Type (Psych Patients Only)  Admission Status Voluntary  Psychosocial Assessment  Patient Complaints Depression  Eye Contact Fair  Facial Expression Anxious  Affect Flat  Speech Logical/coherent  Interaction Assertive  Motor Activity Slow  Appearance/Hygiene In scrubs  Behavior Characteristics Appropriate to situation  Mood Depressed  Thought Process  Coherency Circumstantial  Content Blaming others;Blaming self  Delusions None reported or observed  Perception WDL  Hallucination None reported or observed  Judgment Impaired  Confusion None  Danger to Self  Current suicidal ideation? Denies  Danger to Others  Danger to Others None reported or observed   Dar Note: Patient presents with a flat affect and depressed mood.  Denies suicidal thoughts, auditory and visual hallucinations.  Medications given as prescribed.  Reports tremors, headaches and racing thoughts as withdrawal symptoms.  Routine safety checks maintained.  Patient visible in milieu.  Support and encouragement offered as needed.  Patient is safe on and off the unit.

## 2023-01-30 NOTE — BHH Group Notes (Signed)
Adult Psychoeducational Group Note  Date:  01/30/2023 Time:  11:22 AM  Group Topic/Focus:  Goals Group:   The focus of this group is to help patients establish daily goals to achieve during treatment and discuss how the patient can incorporate goal setting into their daily lives to aide in recovery.  Participation Level:  Active  Participation Quality:  Appropriate  Affect:  Appropriate  Cognitive:  Appropriate  Insight: Good  Engagement in Group:  Engaged  Modes of Intervention:  Discussion  Additional Comments:    Lucilla Edin 01/30/2023, 11:22 AM

## 2023-01-30 NOTE — Group Note (Signed)
Date:  01/30/2023 Time:  9:04 PM  Group Topic/Focus:  Wrap-Up Group:   The focus of this group is to help patients review their daily goal of treatment and discuss progress on daily workbooks.    Participation Level:  Did Not Attend  Participation Quality:   did not attend Wrap Up Group  Affect:  Appropriate  Cognitive:  Appropriate  Insight: Good  Engagement in Group:   n/a  Modes of Intervention:   n/a  Additional Comments:  n/a  Joniya Boberg E Shirel Mallis 01/30/2023, 9:04 PM
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
## 2023-01-30 NOTE — Progress Notes (Signed)
   01/30/23 0630  15 Minute Checks  Location Dayroom  Visual Appearance Calm  Behavior Composed  Sleep (Behavioral Health Patients Only)  Calculate sleep? (Click Yes once per 24 hr at 0600 safety check) Yes  Documented sleep last 24 hours 7

## 2023-01-30 NOTE — Plan of Care (Signed)
  Problem: Education: Goal: Knowledge of [facility] Education information/materials will improve Outcome: Progressing   Problem: Health Behavior/Discharge Planning: Goal: Identification of resources available to assist in meeting health care needs will improve Outcome: Progressing   Problem: Education: Goal: Utilization of techniques to improve thought processes will improve Outcome: Progressing   Problem: Coping: Goal: Coping ability will improve Outcome: Progressing   Problem: Education: Goal: Ability to state activities that reduce stress will improve Outcome: Progressing

## 2023-01-31 MED ORDER — DIAZEPAM 5 MG PO TABS
10.0000 mg | ORAL_TABLET | Freq: Every day | ORAL | Status: DC
  Administered 2023-01-31 – 2023-02-01 (×2): 10 mg via ORAL
  Filled 2023-01-31 (×2): qty 2

## 2023-01-31 MED ORDER — HYDROXYZINE HCL 25 MG PO TABS
50.0000 mg | ORAL_TABLET | Freq: Three times a day (TID) | ORAL | Status: DC
  Administered 2023-01-31 – 2023-02-03 (×8): 50 mg via ORAL
  Filled 2023-01-31 (×2): qty 2
  Filled 2023-01-31: qty 42
  Filled 2023-01-31 (×4): qty 2
  Filled 2023-01-31: qty 42
  Filled 2023-01-31 (×2): qty 2
  Filled 2023-01-31: qty 42
  Filled 2023-01-31 (×4): qty 2

## 2023-01-31 NOTE — Progress Notes (Signed)
   01/31/23 0645  15 Minute Checks  Location Dayroom  Visual Appearance Calm  Behavior Composed  Sleep (Behavioral Health Patients Only)  Calculate sleep? (Click Yes once per 24 hr at 0600 safety check) Yes  Documented sleep last 24 hours 8.5

## 2023-01-31 NOTE — Progress Notes (Signed)
[facility] MD Progress Note  01/31/2023 11:14 AM Keith Gibbs  MRN:  [id number] Subjective:    Keith Gibbs is a 62 year old male with a past psychiatric history of schizoaffective, cocaine use disorder, alcohol use disorder; numerous hospitalizations due to substance intoxication/withdrawal, suicidal thoughts/attempts, mania, psychosis; numerous suicide attempts including this January.  He presents to be behavioral health Hospital from the ED for suicidal thoughts with intent and plan (hanging or slit her wrist or OD).    Case was discussed in the multidisciplinary team. MAR was reviewed and patient is compliant with medications.   Psychiatric Team made the following recommendations yesterday: Decrease diazepam to 10 mg twice daily Continued his medications including Depakote for mood stabilization, duloxetine for MDD, hydroxyzine for anxiety as needed, trazodone and Seroquel at bed for sleep  On interview today patient reports he slept 6 last night. he reports his appetite is doing okay.  he reports continued SI but denies HI and AVH.  He no longer is experiencing paranoia that he was previously experiencing (other patients talking behind his back).  He reports benefit from the 25 3 times daily hydroxyzine but is having continued anxiety and asked to increase this possible.  Regarding his alcohol withdrawal, he reports improvement in his symptoms including his tremors (closer to his baseline), decreased diarrhea, but continued nausea without vomiting.  Today he is still having severe depressive symptoms and suicidal thoughts.  For example, today he was eating his breakfast and had thoughts of wanting to stab himself repeatedly in the throat.  We discussed a verbal safety plan and he was agreeable.  He still endorses all symptoms of depression.  Furthermore he reports continuous anxiety.  He reported a trigger for his anxiety and depression and ongoing suicidal thoughts being conversation with his  mother.  He still wants to go to rehab and is highly motivated to do so.  He continues to express interest about a transitional facility after [facility].  He asked his mother if she can bring some of his stuff from home.  However they got into an argument on the phone and he had a hang up.  He is hoping she will come visit.  Principal Problem: Major depressive disorder, recurrent severe without psychotic features (HCC) Diagnosis: Principal Problem:   Major depressive disorder, recurrent severe without psychotic features (HCC)  Total Time spent with patient: 15 minutes  Past Psychiatric History: schizoaffective, cocaine use disorder, alcohol use disorder; numerous hospitalizations due to substance intoxication/withdrawal, suicidal thoughts/attempts, mania, psychosis; numerous suicide attempts including this January  Past Medical History:  Past Medical History:  Diagnosis Date   Anginal pain (HCC)    Anxiety    Arthritis    Bipolar 1 disorder (HCC)    Bursitis    CAD (coronary artery disease)    Chronic pain    COPD (chronic obstructive pulmonary disease) (HCC)    Current use of long term anticoagulation    DAPT (ASA + clopidogrel)   Depression    Diverticulitis    Dyspnea    GERD (gastroesophageal reflux disease)    Grade I diastolic dysfunction    Hepatitis C 2012   No longer has Hep C   HLD (hyperlipidemia)    Hypertension    MI (myocardial infarction) (HCC)    Polysubstance abuse (HCC)    cocaine, marijuana, ETOH   PUD (peptic ulcer disease)    S/P angioplasty with stent 06/10/2016   a.) 90% stenosis of pLAD to mLAD - 2.5 x 18 mm  Xience Alpine (DES x 1) placed to pLAD   S/P PTCA (percutaneous transluminal coronary angioplasty) 12/04/2019   a.) 60% in stent restenosis of DES to pLAD; LVEF 65%.   Schizophrenia (HCC)    Stroke [facility])    Valvular insufficiency    a.) Mild MR, TR, PR; mild to moderate AR on 03/05/2018 TTE    Past Surgical History:  Procedure Laterality Date    ABDOMINAL SURGERY     removed small piece of intestines due to [facility] Diverticulosis   ANTERIOR LAT LUMBAR FUSION N/A 08/23/2022   Procedure: DIRECT LATERAL INTERBODY FUSION  LUMBAR TWO- LUMBAR THREE, LUMBAR THREE-LUMBAR FOUR , EXPLORE AND EXTEND FUSION LUMBAR TWO-LUMBAR FIVE, POSTERIOR DECOMPRESSION LUMBAR THREE-LUMBAR FOUR, LEFT LUMBAR TWO-LUMBAR THREE;  Surgeon: Bedelia Person, MD;  Location: [facility] OR;  Service: Neurosurgery;  Laterality: N/A;   APPENDECTOMY     BACK SURGERY     CARDIAC CATHETERIZATION Left 06/10/2016   Procedure: Left Heart Cath and Coronary Angiography;  Surgeon: Laurier Nancy, MD;  Location: [facility] INVASIVE CV LAB;  Service: Cardiovascular;  Laterality: Left;   CARDIAC CATHETERIZATION N/A 06/10/2016   Procedure: Coronary Stent Intervention;  Surgeon: Alwyn Pea, MD;  Location: [facility] INVASIVE CV LAB;  Service: Cardiovascular;  Laterality: N/A;   CHOLECYSTECTOMY N/A 02/17/2022   Procedure: LAPAROSCOPIC CHOLECYSTECTOMY;  Surgeon: Sheliah Hatch De Blanch, MD;  Location: [facility] OR;  Service: General;  Laterality: N/A;   COLON SURGERY     COLONOSCOPY     COLONOSCOPY WITH PROPOFOL N/A 01/05/2017   Procedure: COLONOSCOPY WITH PROPOFOL;  Surgeon: Wyline Mood, MD;  Location: [facility] ENDOSCOPY;  Service: Endoscopy;  Laterality: N/A;   COLONOSCOPY WITH PROPOFOL N/A 02/13/2020   Procedure: COLONOSCOPY WITH PROPOFOL;  Surgeon: Pasty Spillers, MD;  Location: [facility] ENDOSCOPY;  Service: Endoscopy;  Laterality: N/A;   CORONARY ANGIOPLASTY WITH STENT PLACEMENT     CORONARY PRESSURE/FFR STUDY N/A 12/04/2019   Procedure: INTRAVASCULAR PRESSURE WIRE/FFR STUDY;  Surgeon: Tonny Bollman, MD;  Location: [facility] INVASIVE CV LAB;  Service: Cardiovascular;  Laterality: N/A;   ESOPHAGOGASTRODUODENOSCOPY (EGD) WITH PROPOFOL N/A 01/05/2017   Procedure: ESOPHAGOGASTRODUODENOSCOPY (EGD) WITH PROPOFOL;  Surgeon: Wyline Mood, MD;  Location: [facility] ENDOSCOPY;  Service: Endoscopy;  Laterality: N/A;    ESOPHAGOGASTRODUODENOSCOPY (EGD) WITH PROPOFOL N/A 02/13/2020   Procedure: ESOPHAGOGASTRODUODENOSCOPY (EGD) WITH PROPOFOL;  Surgeon: Pasty Spillers, MD;  Location: [facility] ENDOSCOPY;  Service: Endoscopy;  Laterality: N/A;   KNEE ARTHROSCOPY WITH MEDIAL MENISECTOMY Right 09/05/2017   Procedure: KNEE ARTHROSCOPY WITH MEDIAL AND LATERAL  MENISECTOMY PARTIAL SYNOVECTOMY;  Surgeon: Kennedy Bucker, MD;  Location: [facility] ORS;  Service: Orthopedics;  Laterality: Right;   LEFT HEART CATH AND CORONARY ANGIOGRAPHY N/A 12/04/2019   Procedure: LEFT HEART CATH AND CORONARY ANGIOGRAPHY;  Surgeon: Tonny Bollman, MD;  Location: [facility] INVASIVE CV LAB;  Service: Cardiovascular;  Laterality: N/A;   LEFT HEART CATH AND CORONARY ANGIOGRAPHY Left 11/25/2022   Procedure: LEFT HEART CATH AND CORONARY ANGIOGRAPHY;  Surgeon: Laurier Nancy, MD;  Location: [facility] INVASIVE CV LAB;  Service: Cardiovascular;  Laterality: Left;   SHOULDER SURGERY Right 04/09/2012   SPINE SURGERY     TOTAL KNEE ARTHROPLASTY Right 01/19/2021   Procedure: TOTAL KNEE ARTHROPLASTY - [place] Neighbor to Assist;  Surgeon: Kennedy Bucker, MD;  Location: [facility] ORS;  Service: Orthopedics;  Laterality: Right;   Family History:  Family History  Problem Relation Age of Onset   Osteoarthritis Mother    Heart disease Mother    Hypertension Mother    Depression  Mother    Heart disease Father    Early death Father    Hypertension Father    Heart attack Father    Hypertension Sister    Parkinson's disease Maternal Grandfather    Prostate cancer Neg Hx    Bladder Cancer Neg Hx    Kidney cancer Neg Hx    Tremor Neg Hx    Family Psychiatric  History: See H&P Social History:  Social History   Substance and Sexual Activity  Alcohol Use Yes   Alcohol/week: 3.0 standard drinks of alcohol   Types: 3 Cans of beer per week   Comment: occassionally      Social History   Substance and Sexual Activity  Drug Use Yes   Types: Cocaine, Marijuana   Comment: last  on saturday      Sleep: Fair  Appetite:  Fair  Current Medications: Current Facility-Administered Medications  Medication Dose Route Frequency Provider Last Rate Last Admin   acetaminophen (TYLENOL) tablet 650 mg  650 mg Oral Q6H PRN Onuoha, Josephine C, NP   650 mg at 01/30/23 1124   albuterol (VENTOLIN HFA) 108 (90 Base) MCG/ACT inhaler 2 puff  2 puff Inhalation Q6H PRN Onuoha, Josephine C, NP       alum & mag hydroxide-simeth (MAALOX/MYLANTA) 200-200-20 MG/5ML suspension 30 mL  30 mL Oral Q4H PRN Dahlia Byes C, NP       aspirin EC tablet 81 mg  81 mg Oral Daily Onuoha, Josephine C, NP   81 mg at 01/31/23 0748   atorvastatin (LIPITOR) tablet 80 mg  80 mg Oral QHS Meryl Dare, MD   80 mg at 01/30/23 2133   Budeson-Glycopyrrol-Formoterol 160-9-4.8 MCG/ACT AERO 2 puff  2 puff Inhalation BID Massengill, Harrold Donath, MD   2 puff at 01/31/23 0745   diazepam (VALIUM) tablet 10 mg  10 mg Oral BID Meryl Dare, MD   10 mg at 01/31/23 0751   diclofenac Sodium (VOLTAREN) 1 % topical gel 2 g  2 g Topical QID Dahlia Byes C, NP   2 g at 01/30/23 2137   diphenhydrAMINE (BENADRYL) capsule 50 mg  50 mg Oral TID PRN Earney Navy, NP       Or   diphenhydrAMINE (BENADRYL) injection 50 mg  50 mg Intramuscular TID PRN Dahlia Byes C, NP       divalproex (DEPAKOTE) DR tablet 1,000 mg  1,000 mg Oral QHS Onuoha, Josephine C, NP   1,000 mg at 01/30/23 2134   divalproex (DEPAKOTE) DR tablet 500 mg  500 mg Oral Daily Massengill, Harrold Donath, MD   500 mg at 01/31/23 0747   DULoxetine (CYMBALTA) DR capsule 30 mg  30 mg Oral TID with meals Meryl Dare, MD   30 mg at 01/31/23 0745   feeding supplement (ENSURE ENLIVE / ENSURE PLUS) liquid 237 mL  237 mL Oral BID BM Bobbitt, Shalon E, NP   237 mL at 01/31/23 0909   gabapentin (NEURONTIN) capsule 600 mg  600 mg Oral TID Meryl Dare, MD   600 mg at 01/31/23 0746   haloperidol (HALDOL) tablet 5 mg  5 mg Oral TID PRN Dahlia Byes C, NP       Or    haloperidol lactate (HALDOL) injection 5 mg  5 mg Intramuscular TID PRN Dahlia Byes C, NP       hydrOXYzine (ATARAX) tablet 25 mg  25 mg Oral TID with meals Meryl Dare, MD   25 mg at 01/31/23 0745   LORazepam (ATIVAN)  tablet 2 mg  2 mg Oral TID PRN Dahlia Byes C, NP       Or   LORazepam (ATIVAN) injection 2 mg  2 mg Intramuscular TID PRN Dahlia Byes C, NP       LORazepam (ATIVAN) tablet 1 mg  1 mg Oral Q6H PRN Meryl Dare, MD       magnesium hydroxide (MILK OF MAGNESIA) suspension 30 mL  30 mL Oral Daily PRN Onuoha, Josephine C, NP       nicotine (NICODERM CQ - dosed in mg/24 hours) patch 14 mg  14 mg Transdermal Daily Meryl Dare, MD   14 mg at 01/31/23 0748   nicotine polacrilex (NICORETTE) gum 2 mg  2 mg Oral PRN Meryl Dare, MD       QUEtiapine (SEROQUEL) tablet 100 mg  100 mg Oral QHS Onuoha, Josephine C, NP   100 mg at 01/30/23 2133   thiamine (Vitamin B-1) tablet 100 mg  100 mg Oral Daily Dahlia Byes C, NP   100 mg at 01/31/23 0749   traZODone (DESYREL) tablet 100 mg  100 mg Oral QHS PRN Dahlia Byes C, NP   100 mg at 01/29/23 2121    Lab Results: No results found for this or any previous visit (from the past 48 hour(s)).  Blood Alcohol level:  Lab Results  Component Value Date   ETH <10 01/28/2023   ETH <10 08/03/2022    Metabolic Disorder Labs: Lab Results  Component Value Date   HGBA1C 5.2 04/26/2022   MPG 102.54 04/26/2022   MPG 103 12/28/2016   No results found for: "PROLACTIN" Lab Results  Component Value Date   CHOL 83 04/26/2022   TRIG 52 04/26/2022   HDL 28 (L) 04/26/2022   CHOLHDL 3.0 04/26/2022   VLDL 10 04/26/2022   LDLCALC 45 04/26/2022   LDLCALC 41 08/31/2021    Physical Findings: CIWA:  CIWA-Ar Total: 6   Musculoskeletal: Strength & Muscle Tone: within normal limits Gait & Station: normal Patient leans: N/A  Psychiatric Specialty Exam:  Presentation  General Appearance:  Appropriate for  Environment  Eye Contact: Fleeting  Speech: Normal Rate  Speech Volume: Normal  Handedness: Right   Mood and Affect  Mood: Dysphoric; Depressed; Worthless; Anxious  Affect: Congruent; Depressed   Thought Process  Thought Processes: Coherent  Descriptions of Associations:Intact  Orientation:Full (Time, Place and Person)  Thought Content:Other (comment); Illogical; Perseveration (Continued thoughts of wanting to kill himself, thoughts that he does not want to follow through with like stabbing self with a fork, perseveration on these thoughts)  History of Schizophrenia/Schizoaffective disorder:No data recorded Duration of Psychotic Symptoms:No data recorded Hallucinations:Hallucinations: None  Ideas of Reference:None  Suicidal Thoughts:Suicidal Thoughts: Yes, Active SI Active Intent and/or Plan: With Intent; With Plan  Homicidal Thoughts:Homicidal Thoughts: No   Sensorium  Memory: Immediate Fair; Recent Fair; Remote Fair  Judgment: Poor  Insight: Poor   Executive Functions  Concentration: Fair  Attention Span: Fair  Recall: [employer] of Knowledge: Fair  Language: Fair   Psychomotor Activity  Psychomotor Activity: Psychomotor Activity: Normal   Assets  Assets: Desire for Improvement; Resilience   Sleep  Sleep: Sleep: Fair (Reports nightmares) Number of Hours of Sleep: 6    Physical Exam: Physical Exam Vitals and nursing note reviewed.  HENT:     Head: Normocephalic and atraumatic.  Pulmonary:     Effort: Pulmonary effort is normal.  Neurological:     General: No focal deficit present.  Mental Status: He is alert.     Motor: Tremor present.     Comments: Diminished tremor compared to yesterday  Psychiatric:        Mood and Affect: Mood is depressed. Affect is blunt and flat.        Cognition and Memory: Memory is impaired.    Review of Systems  Constitutional:  Negative for diaphoresis and fever.  HENT:   Positive for hearing loss.   Cardiovascular:  Negative for palpitations.  Gastrointestinal:  Positive for nausea. Negative for diarrhea and vomiting.  Neurological:  Positive for dizziness and tremors.  Psychiatric/Behavioral:  Positive for depression and suicidal ideas. Negative for hallucinations and memory loss. The patient is nervous/anxious.    Blood pressure (!) 125/103, pulse (!) 118, temperature 97.6 F (36.4 C), temperature source Oral, resp. rate 20, height 6\' 3"  (1.905 m), weight 94.9 kg, SpO2 99 %. Body mass index is 26.15 kg/m.   Treatment Plan Summary: ASSESSMENT & PLAN   ASSESSMENT:   Diagnoses / Active Problems: Major depressive disorder, recurrent severe without psychotic features (HCC) Cocaine use disorder Alcohol use disorder   Patient IVC due to concerns for safety.  No clear medication recommendations that would improve this presentation as he is already already titrated to high dose on duloxetine and followed outpatient.  Acute presentation appears to be more related to psychosocial issues including housing, financial issues, and relationship issues.  Today he is talking about his goals and neck steps including going to rehab.  He appears to be future oriented and motivated for improvement.  We will continue to emphasize psychosocial factors over any medication regimen changes  Furthermore he is experiencing alcohol withdrawal.  He is on CIWA protocol but we also will schedule Valium 10mg  3x daily and taper.  He is still having breakthrough symptoms on the scheduled Valium so we will continue this front loading treatment.   7/8: Continuing Depakote 500 in the morning and 1000 at night; patient is subtherapeutic but likely due nonadherence with medication.  We will recheck this level before discharge.  Tapering down Valium today.  Withdrawal symptoms have been manageable for him.  CIWA is still in place for breakthrough symptoms.  Working with social work to get him set  up with a mark following this hospitalization.   7/9: Withdrawal symptoms are improving.  We will continue to taper the Valium to once daily tomorrow.  Due to his worsening anxiety we increases his hydroxyzine.  Notify social work about Artist after hospitalization and we will need to consider a substitute for gabapentin.  Ordering a valproate trough level for tomorrow before his evening dose and will adjust dose if needed.   PLAN: Safety and Monitoring:             -- Involuntary admission to inpatient psychiatric unit for safety, stabilization and treatment             -- Daily contact with patient to assess and evaluate symptoms and progress in treatment             -- Patient's case to be discussed in multi-disciplinary team meeting             -- Observation Level : q15 minute checks             -- Vital signs:  q12 hours             -- Precautions: suicide, elopement, and assault   2. Psychiatric Diagnoses and Treatment:  --  Continued Depakote 500 mg in the morning and 1000 mg at bedtime.  Trough level ordered for tomorrow             -- Duloxetine 30 mg 3 times daily for depression --Seroquel 100 mg and trazodone 100 mg at night for sleep --Increased hydroxyzine 50 mg 3 times daily  --  The risks/benefits/side-effects/alternatives to this medication were discussed in detail with the patient and time was given for questions. The patient consents to medication trial.              -- Metabolic profile and EKG monitoring obtained while on an atypical antipsychotic (BMI, Lipid Panel, HbgA1c, QTc). See #4 below for values.              -- Encouraged patient to participate in unit milieu and in scheduled group therapies              -- Short Term Goals: Ability to identify changes in lifestyle to reduce recurrence of condition will improve, Ability to verbalize feelings will improve, Ability to disclose and discuss suicidal ideas, Ability to demonstrate self-control will improve, Ability to  identify and develop effective coping behaviors will improve, Ability to maintain clinical measurements within normal limits will improve, Compliance with prescribed medications will improve, and Ability to identify triggers associated with substance abuse/mental health issues will improve             -- Long Term Goals: Improvement in symptoms so as ready for discharge                3. Medical Issues Being Addressed:              -- Alcohol withdrawal: Continue CIWA protocol and scheduled Valium.  Taper Valium down to once at night tomorrow.             -- Nicotine withdrawal: 14 mg patch once daily, Nicorette gum as needed     4. Routine and other pertinent labs reviewed: EKG monitoring: QTc: 462   Metabolism / endocrine: BMI: Body mass index is 26.15 kg/m. Lipid Panel: Recent Labs       Lab Results  Component Value Date    CHOL 83 04/26/2022    TRIG 52 04/26/2022    HDL 28 (L) 04/26/2022    CHOLHDL 3.0 04/26/2022    VLDL 10 04/26/2022    LDLCALC 45 04/26/2022    LDLCALC 41 08/31/2021      HbgA1c: Last Labs     Hgb A1c MFr Bld (%)  Date Value  04/26/2022 5.2      TSH: Last Labs     TSH (uIU/mL)  Date Value  08/03/2022 0.420  09/08/2020 1.610        Labs to order:    5. Discharge Planning:              -- Social work and case management to assist with discharge planning and identification of hospital follow-up needs prior to discharge             -- Estimated LOS: 7-10 days              -- Discharge Concerns: Need to establish a safety plan; Medication compliance and effectiveness             -- Discharge Goals: Return home with outpatient referrals for mental health follow-up including medication management/psychotherapy     I certify that inpatient services furnished can reasonably be expected to improve  the patient's condition.    Meryl Dare, MD 01/31/2023, 11:14 AM

## 2023-01-31 NOTE — Progress Notes (Signed)
   01/30/23 2200  Psych Admission Type (Psych Patients Only)  Admission Status Voluntary  Psychosocial Assessment  Patient Complaints Anxiety  Eye Contact Fair  Facial Expression Anxious  Affect Flat  Speech Logical/coherent  Interaction Assertive  Motor Activity Slow  Appearance/Hygiene In scrubs  Behavior Characteristics Appropriate to situation  Mood Depressed;Anxious  Aggressive Behavior  Effect No apparent injury  Thought Process  Coherency Circumstantial  Content Blaming others;Blaming self  Delusions None reported or observed  Perception WDL  Hallucination None reported or observed  Judgment Limited  Confusion None  Danger to Self  Current suicidal ideation? Denies  Self-Injurious Behavior No self-injurious ideation or behavior indicators observed or expressed   Danger to Others  Danger to Others None reported or observed

## 2023-01-31 NOTE — [facility] Group Notes (Signed)
Patient did not attend the Wrap-up group. 

## 2023-01-31 NOTE — Progress Notes (Signed)
D: Patient is alert, oriented, pleasant, and cooperative. Patient endorsing passive SI. Denies HI, AVH, and verbally contracts for safety. Patient complains of periods of confusion and acid reflux. Patient reports he slept fair last night. Patient reports his appetite as fair, energy level as low, and concentration as poor. Patient rates his depression 8/10, hopelessness 8/10, and anxiety 8/10.    A: Scheduled medications administered per MD order. PRN maaloxSupport provided. Patient educated on safety on the unit and medications. Routine safety checks every 15 minutes. Patient stated understanding to tell nurse about any new physical symptoms. Patient understands to tell staff of any needs.     R: No adverse drug reactions noted. Patient verbally contracts for safety. Patient remains safe at this time and will continue to monitor.    01/31/23 1100  Psych Admission Type (Psych Patients Only)  Admission Status Voluntary  Psychosocial Assessment  Patient Complaints Anxiety;Depression;Confusion  Eye Contact Fair  Facial Expression Anxious  Affect Flat  Speech Logical/coherent  Interaction Assertive  Motor Activity Slow  Appearance/Hygiene Unremarkable  Behavior Characteristics Appropriate to situation  Mood Depressed;Anxious  Thought Process  Coherency Circumstantial  Content Blaming others;Blaming self  Delusions None reported or observed  Perception WDL  Hallucination None reported or observed  Judgment Limited  Confusion Mild  Danger to Self  Current suicidal ideation? Passive  Agreement Not to Harm Self Yes  Description of Agreement verbal  Danger to Others  Danger to Others None reported or observed

## 2023-01-31 NOTE — [facility] Group Notes (Signed)
Adult Psychoeducational Group Note  Date:  01/31/2023 Time:  10:12 AM  Group Topic/Focus:  Goals Group:   The focus of this group is to help patients establish daily goals to achieve during treatment and discuss how the patient can incorporate goal setting into their daily lives to aide in recovery. Orientation:   The focus of this group is to educate the patient on the purpose and policies of crisis stabilization and provide a format to answer questions about their admission.  The group details unit policies and expectations of patients while admitted.  Participation Level:  Active  Participation Quality:  Sharing  Affect:  Appropriate  Cognitive:  Alert  Insight: Good  Engagement in Group:  Engaged  Modes of Intervention:  Discussion and Education  Additional Comments:  Pt participated in group today. Pt was attentive to suggestions and keeping self safe. Pt stated having SI episode during breakfast. Pt identified his gaol for today is to decrease SI, practice self-awareness of thoughts and focus on the positive things.     Tashona Calk 01/31/2023, 10:12 AM

## 2023-01-31 NOTE — Group Note (Signed)
Recreation Therapy Group Note   Group Topic:Animal Assisted Therapy   Group Date: 01/31/2023 Start Time: 0945 End Time: 1030 Facilitators: Ellesse Antenucci-McCall, LRT,CTRS Location: [address] Dayroom   Animal-Assisted Activity (AAA) Program Checklist/Progress Notes Patient Eligibility Criteria Checklist & Daily Group note for Rec Tx Intervention  AAA/T Program Assumption of Risk Form signed by Patient/ or Parent Legal Guardian Yes  Patient is free of allergies or severe asthma Yes  Patient reports no fear of animals Yes  Patient reports no history of cruelty to animals Yes  Patient understands his/her participation is voluntary Yes  Patient washes hands before animal contact Yes  Patient washes hands after animal contact Yes   Affect/Mood: Appropriate   Participation Level: Minimal   Participation Quality: Independent   Behavior: Appropriate   Speech/Thought Process: Relevant    Clinical Observations/Individualized Feedback: Pt came for the last few minutes of group and had minimal interaction with therapy dog team.     Plan: Continue to engage patient in RT group sessions 2-3x/week.   Keith Gibbs, LRT,CTRS 01/31/2023 12:43 PM

## 2023-01-31 NOTE — Group Note (Signed)
[facility] LCSW Group Therapy Note   Group Date: 01/31/2023 Start Time: 1100 End Time: 1200   Type of Therapy and Topic: Group Therapy: Avoiding Self-Sabotaging and Enabling Behaviors  Participation Level: Active    Description of Group:  In this group, patients will learn how to identify obstacles, self-sabotaging and enabling behaviors, as well as: what are they, why do we do them and what needs these behaviors meet. Discuss unhealthy relationships and how to have positive healthy boundaries with those that sabotage and enable. Explore aspects of self-sabotage and enabling in yourself and how to limit these self-destructive behaviors in everyday life.   Therapeutic Goals: 1. Patient will identify one obstacle that relates to self-sabotage and enabling behaviors 2. Patient will identify one personal self-sabotaging or enabling behavior they did prior to admission 3. Patient will state a plan to change the above identified behavior 4. Patient will demonstrate ability to communicate their needs through discussion and/or role play.    Therapeutic Modalities:  Cognitive Behavioral Therapy Person-Centered Therapy Motivational Interviewing    Marinda Elk, [place]

## 2023-02-01 LAB — VALPROIC ACID LEVEL: Valproic Acid Lvl: 52 ug/mL (ref 50.0–100.0)

## 2023-02-01 MED ORDER — ONDANSETRON 4 MG PO TBDP
4.0000 mg | ORAL_TABLET | Freq: Three times a day (TID) | ORAL | Status: DC | PRN
  Administered 2023-02-01: 4 mg via ORAL
  Filled 2023-02-01: qty 1

## 2023-02-01 MED ORDER — DIAZEPAM 5 MG PO TABS
5.0000 mg | ORAL_TABLET | Freq: Once | ORAL | Status: AC
  Administered 2023-02-01: 5 mg via ORAL
  Filled 2023-02-01: qty 1

## 2023-02-01 NOTE — [facility] Group Notes (Signed)
Adult Psychoeducational Group Note  Date:  02/01/2023 Time:  9:50 PM  Group Topic/Focus:  Wrap-Up Group:   The focus of this group is to help patients review their daily goal of treatment and discuss progress on daily workbooks.  Participation Level:  None  Participation Quality:  Appropriate  Affect:  Appropriate  Cognitive:  Appropriate  Insight: Appropriate  Engagement in Group:  None  Modes of Intervention:  Discussion  Additional Comments:   PT did not  attend group.  Joselyn Arrow 02/01/2023, 9:50 PM

## 2023-02-01 NOTE — Telephone Encounter (Signed)
Refill for Tramadol denied at this time, pt currently in hospital under Behavioral health care

## 2023-02-01 NOTE — [facility] Group Notes (Signed)
Spirituality group facilitated by Kathleen Argue, [facility].  Group Description: Group focused on topic of hope. Patients participated in facilitated discussion around topic, connecting with one another around experiences and definitions for hope. Group members engaged with visual explorer photos, reflecting on what hope looks like for them today. Group engaged in discussion around how their definitions of hope are present today in hospital.  Modalities: Psycho-social ed, Adlerian, Narrative, MI  Patient Progress: Keith Gibbs attended group and actively engaged and participated in group conversation and activities. He shared about knowing himself and putting supports in place for himself so that he can continue to work towards his hopes.

## 2023-02-01 NOTE — Progress Notes (Signed)
Pt denied SI/HI/AVH this morning. Pt rated his depression a 8/10, anxiety a 8/10, and feelings of hopelessness a 8/10. Pt reports that he slept "good" last night. Pt complains of dizziness, weakness, nausea, and tremors throughout the day. Pt presents with a visible tremor. MD notified, one time dose of Valium administered per MD order. PRN Zofran administered for nausea. Pt has been pleasant, calm, and cooperative throughout the shift. RN provided support and encouragement to patient. Pt given scheduled medications as prescribed. Q15 min checks verified for safety. Patient verbally contracts for safety. Patient compliant with medications and treatment plan. Patient is interacting well on the unit. Pt is safe on the unit.   02/01/23 0925  Psych Admission Type (Psych Patients Only)  Admission Status Voluntary  Psychosocial Assessment  Patient Complaints Anxiety;Depression  Eye Contact Fair  Facial Expression Anxious  Affect Flat  Speech Logical/coherent  Interaction Assertive  Motor Activity Slow  Appearance/Hygiene Unremarkable  Behavior Characteristics Cooperative;Appropriate to situation  Mood Depressed;Anxious  Thought Process  Coherency WDL  Content WDL  Delusions None reported or observed  Perception WDL  Hallucination None reported or observed  Judgment Impaired  Confusion None  Danger to Self  Current suicidal ideation? Denies  Description of Suicide Plan No plan  Self-Injurious Behavior No self-injurious ideation or behavior indicators observed or expressed   Agreement Not to Harm Self Yes  Description of Agreement Verbal  Danger to Others  Danger to Others None reported or observed

## 2023-02-01 NOTE — [facility] Group Notes (Signed)
Adult Psychoeducational Group Note  Date:  02/01/2023 Time:  4:37 PM  Group Topic/Focus:  Dimensions of Wellness:   The focus of this group is to introduce the topic of wellness and discuss the role each dimension of wellness plays in total health.  Participation Level:  Active  Participation Quality:  Appropriate  Affect:  Appropriate  Cognitive:  Alert  Insight: Good  Engagement in Group:  Engaged  Modes of Intervention:  Discussion and Socialization  Additional Comments:  Pt participated in group. Pt listened as facilitator explained Social Wellness and the eight dimensions of wellness. Pt participated in group activity of identifying at least 3 positive social relationships in his life.     Keith Gibbs 02/01/2023, 4:37 PM

## 2023-02-01 NOTE — Progress Notes (Signed)
[facility] MD Progress Note  02/01/2023 11:55 AM Keith Gibbs  MRN:  [id number] Subjective:    Keith Gibbs is a 62 year old male with a past psychiatric history of schizoaffective, cocaine use disorder, alcohol use disorder; numerous hospitalizations due to substance intoxication/withdrawal, suicidal thoughts/attempts, mania, psychosis; numerous suicide attempts including this January.  He presents to be behavioral health Hospital from the ED for suicidal thoughts with intent and plan (hanging or slit her wrist or OD).    Case was discussed in the multidisciplinary team. MAR was reviewed and patient is compliant with medications.  Took his first dose of the increased hydroxyzine yesterday.  Took one as needed of his Voltaren gel.   Psychiatric Team made the following recommendations yesterday: Decrease decrease diazepam to once daily starting today. Increase hydroxyzine from 25 to 50 mg as needed 3 times daily Continued his medications including Depakote for mood stabilization, duloxetine for MDD,  trazodone and Seroquel at bed for sleep  On interview today Keith Gibbs reports continued struggles but that his suicidal thoughts are only coming and going now.  He feels "drained and monotonous." He still agrees to a verbal safety agreement to tell staff when he is actively suicidal.  He reports that he "finally slept good." He reports that his appetite is improving and that the insurance have been helpful.  Furthermore he had a bowel movement today.  He reports continued SI but denies HI and AVH.   He endorses all symptoms of depression but especially hopelessness.  However he is future oriented and talking about high wants to learn a lot again, to have a partner, and to have purpose.  He is still motivated to go to rehab.  We discussed that he would need to come off the gabapentin but after calling [facility] they will this medication (just not Lyrica).  Regarding his alcohol withdrawal, he reports no symptoms  except headaches.  He denies diarrhea, worse tremor than baseline.  Informed him that we are done only 1 diazepam dose a day now.  Around 11:00, Keith Gibbs started experiencing some some lightheadedness and decided to lay down.  This was remarkable for mild hypotension 117/52 and bradycardia at 50 beats a minute.  This does not change upon standing chart review was done and notable for patent coronary arteries on cardiac catheterization this year.  He denies chest pain, shortness of breath.   Principal Problem: Major depressive disorder, recurrent severe without psychotic features (HCC) Diagnosis: Principal Problem:   Major depressive disorder, recurrent severe without psychotic features (HCC)  Total Time spent with patient: 15 minutes  Past Psychiatric History: schizoaffective, cocaine use disorder, alcohol use disorder; numerous hospitalizations due to substance intoxication/withdrawal, suicidal thoughts/attempts, mania, psychosis; numerous suicide attempts including this January  Past Medical History:  Past Medical History:  Diagnosis Date   Anginal pain (HCC)    Anxiety    Arthritis    Bipolar 1 disorder (HCC)    Bursitis    CAD (coronary artery disease)    Chronic pain    COPD (chronic obstructive pulmonary disease) (HCC)    Current use of long term anticoagulation    DAPT (ASA + clopidogrel)   Depression    Diverticulitis    Dyspnea    GERD (gastroesophageal reflux disease)    Grade I diastolic dysfunction    Hepatitis C 2012   No longer has Hep C   HLD (hyperlipidemia)    Hypertension    MI (myocardial infarction) (HCC)    Polysubstance abuse (HCC)  cocaine, marijuana, ETOH   PUD (peptic ulcer disease)    S/P angioplasty with stent 06/10/2016   a.) 90% stenosis of pLAD to mLAD - 2.5 x 18 mm Xience Alpine (DES x 1) placed to pLAD   S/P PTCA (percutaneous transluminal coronary angioplasty) 12/04/2019   a.) 60% in stent restenosis of DES to pLAD; LVEF 65%.   Schizophrenia  (HCC)    Stroke [facility])    Valvular insufficiency    a.) Mild MR, TR, PR; mild to moderate AR on 03/05/2018 TTE    Past Surgical History:  Procedure Laterality Date   ABDOMINAL SURGERY     removed small piece of intestines due to [facility] Diverticulosis   ANTERIOR LAT LUMBAR FUSION N/A 08/23/2022   Procedure: DIRECT LATERAL INTERBODY FUSION  LUMBAR TWO- LUMBAR THREE, LUMBAR THREE-LUMBAR FOUR , EXPLORE AND EXTEND FUSION LUMBAR TWO-LUMBAR FIVE, POSTERIOR DECOMPRESSION LUMBAR THREE-LUMBAR FOUR, LEFT LUMBAR TWO-LUMBAR THREE;  Surgeon: Bedelia Person, MD;  Location: [facility] OR;  Service: Neurosurgery;  Laterality: N/A;   APPENDECTOMY     BACK SURGERY     CARDIAC CATHETERIZATION Left 06/10/2016   Procedure: Left Heart Cath and Coronary Angiography;  Surgeon: Laurier Nancy, MD;  Location: [facility] INVASIVE CV LAB;  Service: Cardiovascular;  Laterality: Left;   CARDIAC CATHETERIZATION N/A 06/10/2016   Procedure: Coronary Stent Intervention;  Surgeon: Alwyn Pea, MD;  Location: [facility] INVASIVE CV LAB;  Service: Cardiovascular;  Laterality: N/A;   CHOLECYSTECTOMY N/A 02/17/2022   Procedure: LAPAROSCOPIC CHOLECYSTECTOMY;  Surgeon: Sheliah Hatch De Blanch, MD;  Location: [facility] OR;  Service: General;  Laterality: N/A;   COLON SURGERY     COLONOSCOPY     COLONOSCOPY WITH PROPOFOL N/A 01/05/2017   Procedure: COLONOSCOPY WITH PROPOFOL;  Surgeon: Wyline Mood, MD;  Location: [facility] ENDOSCOPY;  Service: Endoscopy;  Laterality: N/A;   COLONOSCOPY WITH PROPOFOL N/A 02/13/2020   Procedure: COLONOSCOPY WITH PROPOFOL;  Surgeon: Pasty Spillers, MD;  Location: [facility] ENDOSCOPY;  Service: Endoscopy;  Laterality: N/A;   CORONARY ANGIOPLASTY WITH STENT PLACEMENT     CORONARY PRESSURE/FFR STUDY N/A 12/04/2019   Procedure: INTRAVASCULAR PRESSURE WIRE/FFR STUDY;  Surgeon: Tonny Bollman, MD;  Location: [facility] INVASIVE CV LAB;  Service: Cardiovascular;  Laterality: N/A;   ESOPHAGOGASTRODUODENOSCOPY (EGD) WITH PROPOFOL N/A  01/05/2017   Procedure: ESOPHAGOGASTRODUODENOSCOPY (EGD) WITH PROPOFOL;  Surgeon: Wyline Mood, MD;  Location: [facility] ENDOSCOPY;  Service: Endoscopy;  Laterality: N/A;   ESOPHAGOGASTRODUODENOSCOPY (EGD) WITH PROPOFOL N/A 02/13/2020   Procedure: ESOPHAGOGASTRODUODENOSCOPY (EGD) WITH PROPOFOL;  Surgeon: Pasty Spillers, MD;  Location: [facility] ENDOSCOPY;  Service: Endoscopy;  Laterality: N/A;   KNEE ARTHROSCOPY WITH MEDIAL MENISECTOMY Right 09/05/2017   Procedure: KNEE ARTHROSCOPY WITH MEDIAL AND LATERAL  MENISECTOMY PARTIAL SYNOVECTOMY;  Surgeon: Kennedy Bucker, MD;  Location: [facility] ORS;  Service: Orthopedics;  Laterality: Right;   LEFT HEART CATH AND CORONARY ANGIOGRAPHY N/A 12/04/2019   Procedure: LEFT HEART CATH AND CORONARY ANGIOGRAPHY;  Surgeon: Tonny Bollman, MD;  Location: [facility] INVASIVE CV LAB;  Service: Cardiovascular;  Laterality: N/A;   LEFT HEART CATH AND CORONARY ANGIOGRAPHY Left 11/25/2022   Procedure: LEFT HEART CATH AND CORONARY ANGIOGRAPHY;  Surgeon: Laurier Nancy, MD;  Location: [facility] INVASIVE CV LAB;  Service: Cardiovascular;  Laterality: Left;   SHOULDER SURGERY Right 04/09/2012   SPINE SURGERY     TOTAL KNEE ARTHROPLASTY Right 01/19/2021   Procedure: TOTAL KNEE ARTHROPLASTY - [place] Neighbor to Assist;  Surgeon: Kennedy Bucker, MD;  Location: [facility] ORS;  Service: Orthopedics;  Laterality: Right;  Family History:  Family History  Problem Relation Age of Onset   Osteoarthritis Mother    Heart disease Mother    Hypertension Mother    Depression Mother    Heart disease Father    Early death Father    Hypertension Father    Heart attack Father    Hypertension Sister    Parkinson's disease Maternal Grandfather    Prostate cancer Neg Hx    Bladder Cancer Neg Hx    Kidney cancer Neg Hx    Tremor Neg Hx    Family Psychiatric  History: See H&P Social History:  Social History   Substance and Sexual Activity  Alcohol Use Yes   Alcohol/week: 3.0 standard drinks of alcohol   Types:  3 Cans of beer per week   Comment: occassionally      Social History   Substance and Sexual Activity  Drug Use Yes   Types: Cocaine, Marijuana   Comment: last on saturday      Sleep: Fair  Appetite:  Fair  Current Medications: Current Facility-Administered Medications  Medication Dose Route Frequency Provider Last Rate Last Admin   acetaminophen (TYLENOL) tablet 650 mg  650 mg Oral Q6H PRN Onuoha, Josephine C, NP   650 mg at 01/30/23 1124   albuterol (VENTOLIN HFA) 108 (90 Base) MCG/ACT inhaler 2 puff  2 puff Inhalation Q6H PRN Dahlia Byes C, NP       alum & mag hydroxide-simeth (MAALOX/MYLANTA) 200-200-20 MG/5ML suspension 30 mL  30 mL Oral Q4H PRN Welford Roche, Josephine C, NP   30 mL at 01/31/23 1603   aspirin EC tablet 81 mg  81 mg Oral Daily Onuoha, Josephine C, NP   81 mg at 02/01/23 1610   atorvastatin (LIPITOR) tablet 80 mg  80 mg Oral QHS Meryl Dare, MD   80 mg at 01/31/23 2052   Budeson-Glycopyrrol-Formoterol 160-9-4.8 MCG/ACT AERO 2 puff  2 puff Inhalation BID Massengill, Harrold Donath, MD   2 puff at 02/01/23 0818   diazepam (VALIUM) tablet 10 mg  10 mg Oral QHS Meryl Dare, MD   10 mg at 01/31/23 2051   diclofenac Sodium (VOLTAREN) 1 % topical gel 2 g  2 g Topical QID Dahlia Byes C, NP   2 g at 01/31/23 2053   diphenhydrAMINE (BENADRYL) capsule 50 mg  50 mg Oral TID PRN Earney Navy, NP       Or   diphenhydrAMINE (BENADRYL) injection 50 mg  50 mg Intramuscular TID PRN Dahlia Byes C, NP       divalproex (DEPAKOTE) DR tablet 1,000 mg  1,000 mg Oral QHS Onuoha, Josephine C, NP   1,000 mg at 01/31/23 2051   divalproex (DEPAKOTE) DR tablet 500 mg  500 mg Oral Daily Massengill, Harrold Donath, MD   500 mg at 02/01/23 0819   DULoxetine (CYMBALTA) DR capsule 30 mg  30 mg Oral TID with meals Meryl Dare, MD   30 mg at 02/01/23 0819   feeding supplement (ENSURE ENLIVE / ENSURE PLUS) liquid 237 mL  237 mL Oral BID BM Bobbitt, Shalon E, NP   237 mL at 01/31/23 1455    gabapentin (NEURONTIN) capsule 600 mg  600 mg Oral TID Meryl Dare, MD   600 mg at 02/01/23 0819   haloperidol (HALDOL) tablet 5 mg  5 mg Oral TID PRN Dahlia Byes C, NP       Or   haloperidol lactate (HALDOL) injection 5 mg  5 mg Intramuscular TID PRN Earney Navy,  NP       hydrOXYzine (ATARAX) tablet 50 mg  50 mg Oral TID with meals Meryl Dare, MD   50 mg at 02/01/23 0819   LORazepam (ATIVAN) tablet 2 mg  2 mg Oral TID PRN Earney Navy, NP       Or   LORazepam (ATIVAN) injection 2 mg  2 mg Intramuscular TID PRN Earney Navy, NP       LORazepam (ATIVAN) tablet 1 mg  1 mg Oral Q6H PRN Meryl Dare, MD       magnesium hydroxide (MILK OF MAGNESIA) suspension 30 mL  30 mL Oral Daily PRN Onuoha, Josephine C, NP       nicotine (NICODERM CQ - dosed in mg/24 hours) patch 14 mg  14 mg Transdermal Daily Meryl Dare, MD   14 mg at 02/01/23 1610   nicotine polacrilex (NICORETTE) gum 2 mg  2 mg Oral PRN Meryl Dare, MD       ondansetron (ZOFRAN-ODT) disintegrating tablet 4 mg  4 mg Oral Q8H PRN Abbott Pao, Nadir, MD   4 mg at 02/01/23 1058   QUEtiapine (SEROQUEL) tablet 100 mg  100 mg Oral QHS Onuoha, Josephine C, NP   100 mg at 01/31/23 2052   thiamine (Vitamin B-1) tablet 100 mg  100 mg Oral Daily Onuoha, Josephine C, NP   100 mg at 02/01/23 9604   traZODone (DESYREL) tablet 100 mg  100 mg Oral QHS PRN Dahlia Byes C, NP   100 mg at 01/31/23 2052    Lab Results: No results found for this or any previous visit (from the past 48 hour(s)).  Blood Alcohol level:  Lab Results  Component Value Date   ETH <10 01/28/2023   ETH <10 08/03/2022    Metabolic Disorder Labs: Lab Results  Component Value Date   HGBA1C 5.2 04/26/2022   MPG 102.54 04/26/2022   MPG 103 12/28/2016   No results found for: "PROLACTIN" Lab Results  Component Value Date   CHOL 83 04/26/2022   TRIG 52 04/26/2022   HDL 28 (L) 04/26/2022   CHOLHDL 3.0 04/26/2022   VLDL 10 04/26/2022    LDLCALC 45 04/26/2022   LDLCALC 41 08/31/2021    Physical Findings: CIWA:  CIWA-Ar Total: 8   Musculoskeletal: Strength & Muscle Tone: within normal limits Gait & Station: normal Patient leans: N/A  Psychiatric Specialty Exam:  Presentation  General Appearance:  Appropriate for Environment  Eye Contact: Fleeting  Speech: Normal Rate  Speech Volume: Normal  Handedness: Right   Mood and Affect  Mood: Dysphoric; Depressed; Worthless; Anxious  Affect: Congruent; Depressed   Thought Process  Thought Processes: Coherent  Descriptions of Associations:Intact  Orientation:Full (Time, Place and Person)  Thought Content:Other (comment); Illogical; Perseveration (Continued thoughts of wanting to kill himself, thoughts that he does not want to follow through with like stabbing self with a fork, perseveration on these thoughts)  History of Schizophrenia/Schizoaffective disorder:No data recorded Duration of Psychotic Symptoms:No data recorded Hallucinations:Hallucinations: None  Ideas of Reference:None  Suicidal Thoughts:Suicidal Thoughts: Yes, Active SI Active Intent and/or Plan: With Intent; With Plan  Homicidal Thoughts:Homicidal Thoughts: No   Sensorium  Memory: Immediate Fair; Recent Fair; Remote Fair  Judgment: Poor  Insight: Poor   Executive Functions  Concentration: Fair  Attention Span: Fair  Recall: [employer] of Knowledge: Fair  Language: Fair   Psychomotor Activity  Psychomotor Activity: Psychomotor Activity: Normal   Assets  Assets: Desire for Improvement; Resilience   Sleep  Sleep: Sleep: Fair (Reports nightmares) Number of Hours of Sleep: 6    Physical Exam: Physical Exam Vitals and nursing note reviewed.  HENT:     Head: Normocephalic and atraumatic.  Cardiovascular:     Rate and Rhythm: Regular rhythm. Bradycardia present.  Pulmonary:     Effort: Pulmonary effort is normal.  Neurological:      General: No focal deficit present.     Mental Status: He is alert.     Motor: Tremor present.     Comments: Diminished tremor compared to yesterday  Psychiatric:        Mood and Affect: Mood is depressed. Affect is blunt and flat.        Cognition and Memory: Memory is impaired.    Review of Systems  Constitutional:  Negative for diaphoresis and fever.       Experiencing lightheadedness.  HENT:  Positive for hearing loss.   Cardiovascular:  Negative for palpitations.  Gastrointestinal:  Negative for diarrhea, nausea and vomiting.  Neurological:  Positive for tremors. Negative for dizziness.       Back to baseline tremors  Psychiatric/Behavioral:  Positive for depression and suicidal ideas. Negative for hallucinations and memory loss. The patient is nervous/anxious.    Blood pressure 106/64, pulse (!) 50, temperature 98 F (36.7 C), temperature source Oral, resp. rate 20, height 6\' 3"  (1.905 m), weight 94.9 kg, SpO2 98 %. Body mass index is 26.15 kg/m.   Treatment Plan Summary: ASSESSMENT & PLAN   ASSESSMENT:   Diagnoses / Active Problems: Major depressive disorder, recurrent severe without psychotic features (HCC) Cocaine use disorder Alcohol use disorder   Patient IVC due to concerns for safety.  No clear medication recommendations that would improve this presentation as he is already already titrated to high dose on duloxetine and followed outpatient.  Acute presentation appears to be more related to psychosocial issues including housing, financial issues, and relationship issues.  Today he is talking about his goals and neck steps including going to rehab.  He appears to be future oriented and motivated for improvement.  We will continue to emphasize psychosocial factors over any medication regimen changes  Furthermore he is experiencing alcohol withdrawal.  He is on CIWA protocol but we also will schedule Valium 10mg  3x daily and taper.  He is still having breakthrough  symptoms on the scheduled Valium so we will continue this front loading treatment.   7/8: Continuing Depakote 500 in the morning and 1000 at night; patient is subtherapeutic but likely due nonadherence with medication.  We will recheck this level before discharge.  Tapering down Valium today.  Withdrawal symptoms have been manageable for him.  CIWA is still in place for breakthrough symptoms.  Working with social work to get him set up with a mark following this hospitalization.   7/9: Withdrawal symptoms are improving.  We will continue to taper the Valium to once daily tomorrow.  Due to his worsening anxiety we increases his hydroxyzine.  Notify social work about Artist after hospitalization and we will need to consider a substitute for gabapentin.  Ordering a valproate trough level for tomorrow before his evening dose and will adjust dose if needed.  7/10: Withdrawal symptoms continue to improve he is down only 10 mg tonight and will decrease to 5 mg per night.  Talked a [facility] and he does not need to come off the gabapentin.  Regarding the lightheadedness, bradycardia, and hypotension, we consulted the hospitalist.  Getting valproic acid level  tonight and will modify doses tomorrow if needed.  PLAN: Safety and Monitoring:             -- Involuntary admission to inpatient psychiatric unit for safety, stabilization and treatment             -- Daily contact with patient to assess and evaluate symptoms and progress in treatment             -- Patient's case to be discussed in multi-disciplinary team meeting             -- Observation Level : q15 minute checks             -- Vital signs:  q12 hours             -- Precautions: suicide, elopement, and assault   2. Psychiatric Diagnoses and Treatment:  -- Continued Depakote 500 mg in the morning and 1000 mg at bedtime.  Trough level ordered for today             -- Duloxetine 30 mg 3 times daily for depression --Seroquel 100 mg and trazodone 100  mg at night for sleep -- Continue hydroxyzine 50 mg 3 times daily. --  The risks/benefits/side-effects/alternatives to this medication were discussed in detail with the patient and time was given for questions. The patient consents to medication trial.              -- Metabolic profile and EKG monitoring obtained while on an atypical antipsychotic (BMI, Lipid Panel, HbgA1c, QTc). See #4 below for values.              -- Encouraged patient to participate in unit milieu and in scheduled group therapies              -- Short Term Goals: Ability to identify changes in lifestyle to reduce recurrence of condition will improve, Ability to verbalize feelings will improve, Ability to disclose and discuss suicidal ideas, Ability to demonstrate self-control will improve, Ability to identify and develop effective coping behaviors will improve, Ability to maintain clinical measurements within normal limits will improve, Compliance with prescribed medications will improve, and Ability to identify triggers associated with substance abuse/mental health issues will improve             -- Long Term Goals: Improvement in symptoms so as ready for discharge                3. Medical Issues Being Addressed:              -- Alcohol withdrawal: Continue CIWA protocol and scheduled Valium.  Taper Valium from 10 to 5 mg once at night.             -- Nicotine withdrawal: 14 mg patch once daily, Nicorette gum as needed  -- Encourage oral hydration to prevent orthostatic hypotension.     4. Routine and other pertinent labs reviewed: EKG monitoring: QTc: 462   Metabolism / endocrine: BMI: Body mass index is 26.15 kg/m. Lipid Panel: Recent Labs       Lab Results  Component Value Date    CHOL 83 04/26/2022    TRIG 52 04/26/2022    HDL 28 (L) 04/26/2022    CHOLHDL 3.0 04/26/2022    VLDL 10 04/26/2022    LDLCALC 45 04/26/2022    LDLCALC 41 08/31/2021      HbgA1c: Last Labs     Hgb A1c MFr Bld (%)  Date Value  04/26/2022 5.2      TSH: Last Labs     TSH (uIU/mL)  Date Value  08/03/2022 0.420  09/08/2020 1.610        Labs to order:    5. Discharge Planning:              -- Social work and case management to assist with discharge planning and identification of hospital follow-up needs prior to discharge             -- Estimated LOS: 7-10 days              -- Discharge Concerns: Need to establish a safety plan; Medication compliance and effectiveness             -- Discharge Goals: Return home with outpatient referrals for mental health follow-up including medication management/psychotherapy     I certify that inpatient services furnished can reasonably be expected to improve the patient's condition.    Meryl Dare, MD 02/01/2023, 11:55 AM

## 2023-02-01 NOTE — Plan of Care (Signed)
  Problem: Education: Goal: Mental status will improve Outcome: Progressing   Problem: Coping: Goal: Coping ability will improve Outcome: Progressing   Problem: Education: Goal: Ability to state activities that reduce stress will improve Outcome: Progressing

## 2023-02-01 NOTE — Group Note (Signed)
Date:  02/01/2023 Time:  10:22 PM  Group Topic/Focus:  NA    Participation Level:  Did Not Attend   Scot Dock 02/01/2023, 10:22 PM

## 2023-02-01 NOTE — [facility] Group Notes (Signed)
Adult Psychoeducational Group Note  Date:  02/01/2023 Time:  10:50 AM  Group Topic/Focus:  Goals Group:   The focus of this group is to help patients establish daily goals to achieve during treatment and discuss how the patient can incorporate goal setting into their daily lives to aide in recovery. Spirituality:   The focus of this group is to discuss how one's spirituality can aide in recovery.  Participation Level:  Active  Participation Quality:  Attentive  Affect:  Appropriate  Cognitive:  Alert  Insight: Appropriate  Engagement in Group:  Engaged  Modes of Intervention:  Discussion and Exploration  Additional Comments:  Pt participated in group today. Pt identified his goal is to focus more on himself, to identify positive aspects of his life and to communicate with staff if or when negative thoughts arise.   Jayson Waterhouse 02/01/2023, 10:50 AM

## 2023-02-01 NOTE — Telephone Encounter (Signed)
Thank you :)

## 2023-02-01 NOTE — Progress Notes (Addendum)
Subjective: Keith Gibbs is a 62 year old male with a PMHx of COPD, CVA, CAD (cath in May of this year showed stents were not stenosed), GERD, HTN, arthritis, chronic pain, schizoaffective.  Around 11:00 the patient was experiencing hypotension, bradycardia, dizziness (more lightheadedness than vertigo) not related to postural changes.  On review of systems he denies chest pain, shortness of breath, palpitations.  At this time, I encouraged him to increase his fluid and brought him a pitcher of water at the bedside.  5:30 PM, I reassessed the patient and he reported no longer experiencing the symptoms.  He reported improvement after increasing his oral fluid intake and eating.  Objective:  Vitals: While sitting pulse 53, blood pressure 117/52; while standing pulse 56, BP 115/64 PE: Bradycardic, normal rhythm  Assessment:  Hospitalist consult with Dr. Sanda Klein (by phone, 12:30p): Case discussed and reviewed with Dr. Robb Matar including the current presentation and medication regimen.  Dr. Robb Matar had access to the patient's record and did a chart review.  He believed the presentation to be most likely related to an adverse effect of medication, such as Seroquel.  He noted that the bradycardia had began between November 2022 in May 2023.  He recommended consult cardiology.    Cardiology consult with Dr. York Ram (by phone, 4:50p): Case discussed and reviewed with Dr. Gery Pray including the current presentation.  Independent of his medication regimen, Dr. Gery Pray reported that he was not concerned about bradycardia has been stable over a year and did not recommend any further management or medication adjustments.  The psychiatric team does not believe that the Seroquel is responsible for the bradycardia as the patient was bradycardic at baseline for a while prior to starting the Seroquel (per chart review) and the pulse did not appear to significantly change following starting this medication.   Plan:   -- Continue to monitor the patient including reassessing symptoms and monitoring vitals.  -- Encourage the patient to continue hydrating with water -- Low threshold to transferring the patient to the emergency department including reoccurrence and worsening of symptoms, worsening of hypotension, bradycardia that is consistently below his current baseline of the low 50s. -- No changes to his medication regimen at this time.

## 2023-02-01 NOTE — Group Note (Signed)
Recreation Therapy Group Note   Group Topic:Communication  Group Date: 02/01/2023 Start Time: 0935 End Time: 1000 Facilitators: Averie Meiner-McCall, LRT,CTRS Location: [address] Day[address]   Goal Area(s) Addresses:  Patient will effectively listen to complete activity.  Patient will identify communication skills used to make activity successful.  Patient will identify how skills used during activity can be used to reach post d/c goals.    Group Descripiton: Geometric Drawings.  Three volunteers from the peer group will be shown an abstract picture with a particular arrangement of geometrical shapes.  Each round, one 'speaker' will describe the pattern, as accurately as possible without revealing the image to the group.  The remaining group members will listen and draw the picture to reflect how it is described to them. Patients with the role of 'listener' cannot ask clarifying questions but, may request that the speaker repeat a direction. Once the drawings are complete, the presenter will show the rest of the group the picture and compare how close each person came to drawing the picture. LRT will facilitate a post-activity discussion regarding effective communication and the importance of planning, listening, and asking for clarification in daily interactions with others.   Affect/Mood: N/A   Participation Level: Did not attend    Clinical Observations/Individualized Feedback:     Plan: Continue to engage patient in RT group sessions 2-3x/week.   Nyzaiah Kai-McCall, LRT,CTRS 02/01/2023 12:26 PM

## 2023-02-01 NOTE — Group Note (Unsigned)
Date:  02/01/2023 Time:  10:07 PM  Group Topic/Focus:  Wrap-Up Group:   The focus of this group is to help patients review their daily goal of treatment and discuss progress on daily workbooks.     Participation Level:  {BHH PARTICIPATION LEVEL:22264}  Participation Quality:  {BHH PARTICIPATION QUALITY:22265}  Affect:  {BHH AFFECT:22266}  Cognitive:  {BHH COGNITIVE:22267}  Insight: {BHH Insight2:20797}  Engagement in Group:  {BHH ENGAGEMENT IN GROUP:22268}  Modes of Intervention:  {BHH MODES OF INTERVENTION:22269}  Additional Comments:  ***  Zareya Tuckett [place] 02/01/2023, 10:07 PM  

## 2023-02-01 NOTE — Progress Notes (Signed)
   01/31/23 2230  Psych Admission Type (Psych Patients Only)  Admission Status Voluntary  Psychosocial Assessment  Patient Complaints Anxiety  Eye Contact Fair  Facial Expression Anxious  Affect Flat  Speech Logical/coherent  Interaction Assertive  Motor Activity Slow  Appearance/Hygiene Unremarkable  Behavior Characteristics Appropriate to situation;Cooperative  Mood Depressed  Thought Process  Coherency WDL  Content WDL  Delusions None reported or observed  Perception WDL  Hallucination None reported or observed  Judgment Poor  Confusion Mild  Danger to Self  Current suicidal ideation? Denies  Self-Injurious Behavior No self-injurious ideation or behavior indicators observed or expressed   Agreement Not to Harm Self Yes  Description of Agreement verbal  Danger to Others  Danger to Others None reported or observed

## 2023-02-02 MED ORDER — DIAZEPAM 5 MG PO TABS
5.0000 mg | ORAL_TABLET | Freq: Every day | ORAL | Status: DC
  Administered 2023-02-02: 5 mg via ORAL
  Filled 2023-02-02: qty 1

## 2023-02-02 MED ORDER — PANTOPRAZOLE SODIUM 40 MG PO TBEC
40.0000 mg | DELAYED_RELEASE_TABLET | Freq: Every day | ORAL | Status: DC
  Administered 2023-02-02 – 2023-02-03 (×2): 40 mg via ORAL
  Filled 2023-02-02 (×3): qty 1
  Filled 2023-02-02: qty 14
  Filled 2023-02-02: qty 1

## 2023-02-02 NOTE — Progress Notes (Signed)
  [facility] Adult Case Management Discharge Plan :  Will you be returning to the same living situation after discharge:  No.Patient will be going to [facility] residential in [facility] [place] At discharge, do you have transportation home?: Yes,  CSW will call a Taxi in the AM from [facility]  Do you have the ability to pay for your medications: Yes,  [facility] Tailored Plan / [facility] Tailored Plan   Release of information consent forms completed and in the chart;  Patient's signature needed at discharge.  Patient to Follow up at:  Follow-up Information     [facility]. Go to.   Specialty: Behavioral Health Why: Please go to this provider for therapy and medication management services. For fastest service, please go to this provider on Monday through Friday, by 7:00 am. Contact information: [address] [place] [address] [phone]        Services, [facility]. Go on 02/03/2023.   Why: A referral was made on your behalf and you were accepted to this residential. You will go door to door tomorrow before 9:00 AM. Contact information: [address] [place] [address] [phone]                 Next level of care provider has access to [facility] Link:yes  Safety Planning and Suicide Prevention discussed: No.Unable to make contact with mom      Has patient been referred to the Quitline?: Patient refused referral for treatment  Patient has been referred for addiction treatment: Yes, the patient will follow up with an outpatient provider for substance use disorder. Psychiatrist/APP: appointment made for tomorrow for patient to complete his admission assessment and start program at [facility] in [facility]  Inocente Salles [place], [place] 02/02/2023, 3:59 PM

## 2023-02-02 NOTE — Progress Notes (Signed)
   02/02/23 0000  Psych Admission Type (Psych Patients Only)  Admission Status Voluntary  Psychosocial Assessment  Patient Complaints Anxiety;Depression  Eye Contact Fair  Facial Expression Other (Comment) (WDL)  Affect Other (Comment) (WDL)  Speech Logical/coherent  Interaction Assertive  Motor Activity Slow  Appearance/Hygiene Unremarkable  Behavior Characteristics Cooperative;Appropriate to situation  Mood Pleasant  Thought Process  Coherency WDL  Content WDL  Delusions WDL  Perception Hallucinations  Hallucination Auditory  Judgment Poor  Confusion None  Danger to Self  Current suicidal ideation? Denies  Agreement Not to Harm Self Yes  Description of Agreement Verbal  Danger to Others  Danger to Others None reported or observed   D: Pt alert and oriented. Pt rates depression 7/10 and anxiety 7/10.  Pt denies experiencing any SI/HI, or AVH at this time.   A: Scheduled medications administered to pt, per MD orders. Support and encouragement provided. Frequent verbal contact made. Routine safety checks conducted q15 minutes.   R: No adverse drug reactions noted. Pt verbally contracts for safety at this time. Pt complaint with medications and treatment plan. Pt interacts well with others on the unit. Pt remains safe at this time. Will continue to monitor.

## 2023-02-02 NOTE — BHH Group Notes (Signed)
Adult Psychoeducational Group Note  Date:  02/02/2023 Time:  1:09 PM  Group Topic/Focus:  Dimensions of Wellness:   The focus of this group is to introduce the topic of wellness and discuss the role each dimension of wellness plays in total health. Goals Group:   The focus of this group is to help patients establish daily goals to achieve during treatment and discuss how the patient can incorporate goal setting into their daily lives to aide in recovery.  Participation Level:  Active  Participation Quality:  Appropriate  Affect:  Appropriate  Cognitive:  Appropriate  Insight: Appropriate  Engagement in Group:  Engaged  Modes of Intervention:  Discussion and Exploration  Additional Comments:  Pt participated in group today. Pt stated his goal for today is to open up more, not suppress his feelings and let staff know if or when negative thoughts increase. Pt intently listened as group shared their understanding of self-acceptance and identified potential barriers and or obstacles.     Keith Gibbs 02/02/2023, 1:09 PM

## 2023-02-02 NOTE — Progress Notes (Signed)
   02/02/23 0900  Psych Admission Type (Psych Patients Only)  Admission Status Voluntary  Psychosocial Assessment  Patient Complaints Anxiety;Depression  Eye Contact Fair  Facial Expression Anxious  Affect Flat  Speech Logical/coherent  Interaction Assertive  Motor Activity Slow  Appearance/Hygiene Unremarkable  Behavior Characteristics Cooperative  Mood Pleasant  Thought Process  Coherency WDL  Content WDL  Delusions None reported or observed  Perception WDL  Hallucination None reported or observed  Judgment Limited  Confusion WDL  Danger to Self  Current suicidal ideation? Denies  Agreement Not to Harm Self Yes  Description of Agreement verbal  Danger to Others  Danger to Others None reported or observed

## 2023-02-02 NOTE — Group Note (Unsigned)
Date:  02/02/2023 Time:  10:38 PM  Group Topic/Focus:  Wrap-Up Group:   The focus of this group is to help patients review their daily goal of treatment and discuss progress on daily workbooks.     Participation Level:  {BHH PARTICIPATION UJWJX:91478}  Participation Quality:  {BHH PARTICIPATION QUALITY:22265}  Affect:  {BHH AFFECT:22266}  Cognitive:  {BHH COGNITIVE:22267}  Insight: {BHH Insight2:20797}  Engagement in Group:  {BHH ENGAGEMENT IN GNFAO:13086}  Modes of Intervention:  {BHH MODES OF INTERVENTION:22269}  Additional Comments:  ***  Chauncey Fischer 02/02/2023, 10:38 PM

## 2023-02-02 NOTE — Group Note (Unsigned)
Date:  02/02/2023 Time:  10:36 PM  Group Topic/Focus:  Wrap-Up Group:   The focus of this group is to help patients review their daily goal of treatment and discuss progress on daily workbooks.     Participation Level:  {BHH PARTICIPATION LEVEL:22264}  Participation Quality:  {BHH PARTICIPATION QUALITY:22265}  Affect:  {BHH AFFECT:22266}  Cognitive:  {BHH COGNITIVE:22267}  Insight: {BHH Insight2:20797}  Engagement in Group:  {BHH ENGAGEMENT IN GROUP:22268}  Modes of Intervention:  {BHH MODES OF INTERVENTION:22269}  Additional Comments:  ***  Betania Dizon Long 02/02/2023, 10:36 PM  

## 2023-02-02 NOTE — Progress Notes (Signed)
BHH/BMU LCSW Progress Note   02/02/2023    9:05 AM  Keith Gibbs      Type of Note: Follow Up W/ [facility]   CSW spoke with [name] the admissions coordinator and provided her with patient last Suicide Attempt which was January of 2024 and with his current admissions patient only had Suicide Ideation. Furthermore, [name] said it should be fine, just will need to run it by the nurse and see if Friday is good to bring him in and call back with answer.     Signed:   Jacob Moores, MSW, [facility] 02/02/2023 9:05 AM

## 2023-02-02 NOTE — Progress Notes (Signed)
   02/02/23 2237  Psych Admission Type (Psych Patients Only)  Admission Status Voluntary  Psychosocial Assessment  Patient Complaints None  Eye Contact Fair  Facial Expression Other (Comment) (WDL)  Affect Other (Comment) (WDL)  Speech Logical/coherent  Interaction Assertive  Motor Activity Slow  Appearance/Hygiene Unremarkable  Behavior Characteristics Cooperative  Mood Pleasant  Thought Process  Coherency WDL  Content WDL  Delusions WDL  Perception Hallucinations  Hallucination Auditory  Judgment Poor  Confusion None  Danger to Self  Current suicidal ideation? Denies  Agreement Not to Harm Self Yes  Description of Agreement Verbal  Danger to Others  Danger to Others None reported or observed   D: Pt alert and oriented. Pt rates depression 0/10 and anxiety 0/10.  Pt denies experiencing any SI/HI, or AVH at this time.   A: Scheduled medications administered to pt, per MD orders. Support and encouragement provided. Frequent verbal contact made. Routine safety checks conducted q15 minutes.   R: No adverse drug reactions noted. Pt verbally contracts for safety at this time. Pt complaint with medications and treatment plan. Pt interacts well with others on the unit. Pt remains safe at this time. Will continue to monitor.

## 2023-02-02 NOTE — Progress Notes (Addendum)
[facility] MD Progress Note  02/02/2023 12:54 PM [name]  MRN:  [id number] Subjective:    Keith Gibbs is a 62 year old male with a past psychiatric history of schizoaffective, cocaine use disorder, alcohol use disorder; numerous hospitalizations due to substance intoxication/withdrawal, suicidal thoughts/attempts, mania, psychosis; numerous suicide attempts including this January.  He presents to be behavioral health Hospital from the ED for suicidal thoughts with intent and plan (hanging or slit her wrist or OD).   Case was discussed in the multidisciplinary team. MAR was reviewed and patient is compliant with medications.  Additionally yesterday he received an extra dose of Valium 5 mg in the morning due to symptomatic alcohol withdrawal then received his 10 mg of Valium at night.   Psychiatric Team made the following recommendations yesterday: Decrease scheduled diazepam to 10 mg once at night. Continued hydroxyzine from 25 to 50 mg as needed 3 times daily Continued his medications including Depakote for mood stabilization, duloxetine for MDD,  trazodone and Seroquel at bed for sleep  On interview today Laird discusses that his mother visited last night and they had difficult conversations.  She asked "do you not want to live with me anymore."  He said he was not sure how to answer that.  As a result of his conversation, he says that he may go back to his mother's home if she is "willing to work with him."  His other option is still to pursue a transitional living facility for individuals with addiction.  He talked about how he wanted to reconnect with important people in his life including Dr. Morrie Sheldon, his therapist, his daughter Wiliam Ke, his granddaughter, and his AA groups.  He also talked about the difficulties of losing his youngest daughter 3 years ago in a car crash.  He went to details about what happened and was tearful during this.  After the conversation we talked about  his suicidal thoughts and he said that they were much better than usual today.  He said he has had no thoughts over the last 12 hours and "were not on his mind."   Regarding the dizziness episode yesterday, he reports no symptoms that he was experiencing yesterday except a mild headache.  He continues to endorse no issues with positional changes, no vertigo, no lightheadedness, no nausea, no chest pain, no shortness of breath.  He did note that having pain following meals consistent with his acid reflux, which she has been treated for in the past.  We discussed restarting medication for him and he was agreeable to this.  Regarding his alcohol withdrawal, he reports no symptoms except headaches.  He denies diarrhea, tremor (besides his baseline tremor), palpitations, sweating, insomnia, nausea, vomiting.  He denies HI and AVH.  Again, he is now denying suicidal thoughts. He reports no paranoia.  Sleep was good yesterday, about 9 straight hours.  His appetite is still good but is having some acid reflux.  Principal Problem: Major depressive disorder, recurrent severe without psychotic features (HCC) Diagnosis: Principal Problem:   Major depressive disorder, recurrent severe without psychotic features (HCC)  Total Time spent with patient: 15 minutes  Past Psychiatric History: schizoaffective, cocaine use disorder, alcohol use disorder; numerous hospitalizations due to substance intoxication/withdrawal, suicidal thoughts/attempts, mania, psychosis; numerous suicide attempts including this January  Past Medical History:  Past Medical History:  Diagnosis Date   Anginal pain (HCC)    Anxiety    Arthritis    Bipolar 1 disorder (HCC)    Bursitis  CAD (coronary artery disease)    Chronic pain    COPD (chronic obstructive pulmonary disease) (HCC)    Current use of long term anticoagulation    DAPT (ASA + clopidogrel)   Depression    Diverticulitis    Dyspnea    GERD (gastroesophageal reflux  disease)    Grade I diastolic dysfunction    Hepatitis C 2012   No longer has Hep C   HLD (hyperlipidemia)    Hypertension    MI (myocardial infarction) (HCC)    Polysubstance abuse (HCC)    cocaine, marijuana, ETOH   PUD (peptic ulcer disease)    S/P angioplasty with stent 06/10/2016   a.) 90% stenosis of pLAD to mLAD - 2.5 x 18 mm Xience Alpine (DES x 1) placed to pLAD   S/P PTCA (percutaneous transluminal coronary angioplasty) 12/04/2019   a.) 60% in stent restenosis of DES to pLAD; LVEF 65%.   Schizophrenia (HCC)    Stroke [facility])    Valvular insufficiency    a.) Mild MR, TR, PR; mild to moderate AR on 03/05/2018 TTE    Past Surgical History:  Procedure Laterality Date   ABDOMINAL SURGERY     removed small piece of intestines due to [facility] Diverticulosis   ANTERIOR LAT LUMBAR FUSION N/A 08/23/2022   Procedure: DIRECT LATERAL INTERBODY FUSION  LUMBAR TWO- LUMBAR THREE, LUMBAR THREE-LUMBAR FOUR , EXPLORE AND EXTEND FUSION LUMBAR TWO-LUMBAR FIVE, POSTERIOR DECOMPRESSION LUMBAR THREE-LUMBAR FOUR, LEFT LUMBAR TWO-LUMBAR THREE;  Surgeon: Bedelia Person, MD;  Location: [facility] OR;  Service: Neurosurgery;  Laterality: N/A;   APPENDECTOMY     BACK SURGERY     CARDIAC CATHETERIZATION Left 06/10/2016   Procedure: Left Heart Cath and Coronary Angiography;  Surgeon: Laurier Nancy, MD;  Location: [facility] INVASIVE CV LAB;  Service: Cardiovascular;  Laterality: Left;   CARDIAC CATHETERIZATION N/A 06/10/2016   Procedure: Coronary Stent Intervention;  Surgeon: Alwyn Pea, MD;  Location: [facility] INVASIVE CV LAB;  Service: Cardiovascular;  Laterality: N/A;   CHOLECYSTECTOMY N/A 02/17/2022   Procedure: LAPAROSCOPIC CHOLECYSTECTOMY;  Surgeon: Sheliah Hatch De Blanch, MD;  Location: [facility] OR;  Service: General;  Laterality: N/A;   COLON SURGERY     COLONOSCOPY     COLONOSCOPY WITH PROPOFOL N/A 01/05/2017   Procedure: COLONOSCOPY WITH PROPOFOL;  Surgeon: Wyline Mood, MD;  Location: [facility] ENDOSCOPY;  Service:  Endoscopy;  Laterality: N/A;   COLONOSCOPY WITH PROPOFOL N/A 02/13/2020   Procedure: COLONOSCOPY WITH PROPOFOL;  Surgeon: Pasty Spillers, MD;  Location: [facility] ENDOSCOPY;  Service: Endoscopy;  Laterality: N/A;   CORONARY ANGIOPLASTY WITH STENT PLACEMENT     CORONARY PRESSURE/FFR STUDY N/A 12/04/2019   Procedure: INTRAVASCULAR PRESSURE WIRE/FFR STUDY;  Surgeon: Tonny Bollman, MD;  Location: [facility] INVASIVE CV LAB;  Service: Cardiovascular;  Laterality: N/A;   ESOPHAGOGASTRODUODENOSCOPY (EGD) WITH PROPOFOL N/A 01/05/2017   Procedure: ESOPHAGOGASTRODUODENOSCOPY (EGD) WITH PROPOFOL;  Surgeon: Wyline Mood, MD;  Location: [facility] ENDOSCOPY;  Service: Endoscopy;  Laterality: N/A;   ESOPHAGOGASTRODUODENOSCOPY (EGD) WITH PROPOFOL N/A 02/13/2020   Procedure: ESOPHAGOGASTRODUODENOSCOPY (EGD) WITH PROPOFOL;  Surgeon: Pasty Spillers, MD;  Location: [facility] ENDOSCOPY;  Service: Endoscopy;  Laterality: N/A;   KNEE ARTHROSCOPY WITH MEDIAL MENISECTOMY Right 09/05/2017   Procedure: KNEE ARTHROSCOPY WITH MEDIAL AND LATERAL  MENISECTOMY PARTIAL SYNOVECTOMY;  Surgeon: Kennedy Bucker, MD;  Location: [facility] ORS;  Service: Orthopedics;  Laterality: Right;   LEFT HEART CATH AND CORONARY ANGIOGRAPHY N/A 12/04/2019   Procedure: LEFT HEART CATH AND CORONARY ANGIOGRAPHY;  Surgeon: Tonny Bollman,  MD;  Location: [facility] INVASIVE CV LAB;  Service: Cardiovascular;  Laterality: N/A;   LEFT HEART CATH AND CORONARY ANGIOGRAPHY Left 11/25/2022   Procedure: LEFT HEART CATH AND CORONARY ANGIOGRAPHY;  Surgeon: Laurier Nancy, MD;  Location: [facility] INVASIVE CV LAB;  Service: Cardiovascular;  Laterality: Left;   SHOULDER SURGERY Right 04/09/2012   SPINE SURGERY     TOTAL KNEE ARTHROPLASTY Right 01/19/2021   Procedure: TOTAL KNEE ARTHROPLASTY - [place] Neighbor to Assist;  Surgeon: Kennedy Bucker, MD;  Location: [facility] ORS;  Service: Orthopedics;  Laterality: Right;   Family History:  Family History  Problem Relation Age of Onset   Osteoarthritis  Mother    Heart disease Mother    Hypertension Mother    Depression Mother    Heart disease Father    Early death Father    Hypertension Father    Heart attack Father    Hypertension Sister    Parkinson's disease Maternal Grandfather    Prostate cancer Neg Hx    Bladder Cancer Neg Hx    Kidney cancer Neg Hx    Tremor Neg Hx    Family Psychiatric  History: did not provide. Social History:  Social History   Substance and Sexual Activity  Alcohol Use Yes   Alcohol/week: 3.0 standard drinks of alcohol   Types: 3 Cans of beer per week   Comment: occassionally      Social History   Substance and Sexual Activity  Drug Use Yes   Types: Cocaine, Marijuana   Comment: last on saturday      Sleep: slept 9 hours  Appetite: Good  Current Medications: Current Facility-Administered Medications  Medication Dose Route Frequency Provider Last Rate Last Admin   acetaminophen (TYLENOL) tablet 650 mg  650 mg Oral Q6H PRN Onuoha, Josephine C, NP   650 mg at 01/30/23 1124   albuterol (VENTOLIN HFA) 108 (90 Base) MCG/ACT inhaler 2 puff  2 puff Inhalation Q6H PRN Dahlia Byes C, NP   2 puff at 02/01/23 2011   alum & mag hydroxide-simeth (MAALOX/MYLANTA) 200-200-20 MG/5ML suspension 30 mL  30 mL Oral Q4H PRN Dahlia Byes C, NP   30 mL at 02/01/23 2014   aspirin EC tablet 81 mg  81 mg Oral Daily Onuoha, Josephine C, NP   81 mg at 02/02/23 0831   atorvastatin (LIPITOR) tablet 80 mg  80 mg Oral QHS Meryl Dare, MD   80 mg at 02/01/23 2135   Budeson-Glycopyrrol-Formoterol 160-9-4.8 MCG/ACT AERO 2 puff  2 puff Inhalation BID Massengill, Harrold Donath, MD   2 puff at 02/02/23 0832   diazepam (VALIUM) tablet 5 mg  5 mg Oral QHS Meryl Dare, MD       diclofenac Sodium (VOLTAREN) 1 % topical gel 2 g  2 g Topical QID Dahlia Byes C, NP   2 g at 02/02/23 0831   diphenhydrAMINE (BENADRYL) capsule 50 mg  50 mg Oral TID PRN Earney Navy, NP       Or   diphenhydrAMINE (BENADRYL)  injection 50 mg  50 mg Intramuscular TID PRN Dahlia Byes C, NP       divalproex (DEPAKOTE) DR tablet 1,000 mg  1,000 mg Oral QHS Onuoha, Josephine C, NP   1,000 mg at 02/01/23 2134   divalproex (DEPAKOTE) DR tablet 500 mg  500 mg Oral Daily Massengill, Harrold Donath, MD   500 mg at 02/02/23 0831   DULoxetine (CYMBALTA) DR capsule 30 mg  30 mg Oral TID with meals Meryl Dare, MD  30 mg at 02/02/23 1210   feeding supplement (ENSURE ENLIVE / ENSURE PLUS) liquid 237 mL  237 mL Oral BID BM Bobbitt, Shalon E, NP   237 mL at 02/02/23 8295   gabapentin (NEURONTIN) capsule 600 mg  600 mg Oral TID Meryl Dare, MD   600 mg at 02/02/23 1210   haloperidol (HALDOL) tablet 5 mg  5 mg Oral TID PRN Earney Navy, NP       Or   haloperidol lactate (HALDOL) injection 5 mg  5 mg Intramuscular TID PRN Earney Navy, NP       hydrOXYzine (ATARAX) tablet 50 mg  50 mg Oral TID with meals Meryl Dare, MD   50 mg at 02/02/23 1210   LORazepam (ATIVAN) tablet 2 mg  2 mg Oral TID PRN Earney Navy, NP       Or   LORazepam (ATIVAN) injection 2 mg  2 mg Intramuscular TID PRN Dahlia Byes C, NP       LORazepam (ATIVAN) tablet 1 mg  1 mg Oral Q6H PRN Meryl Dare, MD       magnesium hydroxide (MILK OF MAGNESIA) suspension 30 mL  30 mL Oral Daily PRN Onuoha, Josephine C, NP       nicotine (NICODERM CQ - dosed in mg/24 hours) patch 14 mg  14 mg Transdermal Daily Meryl Dare, MD   14 mg at 02/02/23 6213   nicotine polacrilex (NICORETTE) gum 2 mg  2 mg Oral PRN Meryl Dare, MD       ondansetron (ZOFRAN-ODT) disintegrating tablet 4 mg  4 mg Oral Q8H PRN Abbott Pao, Nadir, MD   4 mg at 02/01/23 1058   pantoprazole (PROTONIX) EC tablet 40 mg  40 mg Oral Daily Meryl Dare, MD   40 mg at 02/02/23 1021   QUEtiapine (SEROQUEL) tablet 100 mg  100 mg Oral QHS Onuoha, Josephine C, NP   100 mg at 02/01/23 2135   thiamine (Vitamin B-1) tablet 100 mg  100 mg Oral Daily Onuoha, Josephine C, NP   100 mg at  02/02/23 0830   traZODone (DESYREL) tablet 100 mg  100 mg Oral QHS PRN Dahlia Byes C, NP   100 mg at 01/31/23 2052    Lab Results:  Results for orders placed or performed during the hospital encounter of 01/28/23 (from the past 48 hour(s))  Valproic acid level     Status: None   Collection Time: 02/01/23  6:07 PM  Result Value Ref Range   Valproic Acid Lvl 52 50.0 - 100.0 ug/mL    Comment: Performed at [facility], [address] [place], [place] [address]    Blood Alcohol level:  Lab Results  Component Value Date   ETH <10 01/28/2023   ETH <10 08/03/2022    Metabolic Disorder Labs: Lab Results  Component Value Date   HGBA1C 5.2 04/26/2022   MPG 102.54 04/26/2022   MPG 103 12/28/2016   No results found for: "PROLACTIN" Lab Results  Component Value Date   CHOL 83 04/26/2022   TRIG 52 04/26/2022   HDL 28 (L) 04/26/2022   CHOLHDL 3.0 04/26/2022   VLDL 10 04/26/2022   LDLCALC 45 04/26/2022   LDLCALC 41 08/31/2021    Physical Findings: CIWA:  CIWA-Ar Total: 8   Musculoskeletal: Strength & Muscle Tone: within normal limits Gait & Station: normal Patient leans: N/A  Psychiatric Specialty Exam:  Presentation  General Appearance:  Appropriate for Environment  Eye Contact: Good  Speech:  Normal Rate  Speech Volume: Normal  Handedness: Right   Mood and Affect  Mood: Euthymic (Tearful when talking about his youngest daughter who died.)  Affect: Appropriate   Thought Process  Thought Processes: Coherent  Descriptions of Associations:Intact  Orientation:Full (Time, Place and Person)  Thought Content:Logical  History of Schizophrenia/Schizoaffective disorder:No data recorded Duration of Psychotic Symptoms:No data recorded Hallucinations:Hallucinations: None   Ideas of Reference:None  Suicidal Thoughts:Suicidal Thoughts: No   Homicidal Thoughts:Homicidal Thoughts: No    Sensorium  Memory: Immediate Fair;  Recent Fair; Remote Fair  Judgment: Fair  Insight: Fair   Art therapist  Concentration: Good  Attention Span: Good  Recall: Fair  Fund of Knowledge: Fair  Language: Fair   Psychomotor Activity  Psychomotor Activity: Psychomotor Activity: Normal    Assets  Assets: Communication Skills; Desire for Improvement; Housing; Social Support   Sleep  Sleep: Sleep: Good Number of Hours of Sleep: 9     Physical Exam: Physical Exam Vitals and nursing note reviewed.  HENT:     Head: Normocephalic and atraumatic.  Cardiovascular:     Rate and Rhythm: Regular rhythm. Bradycardia present.  Pulmonary:     Effort: Pulmonary effort is normal.  Neurological:     General: No focal deficit present.     Mental Status: He is alert.     Motor: Tremor present.     Comments: Tremor back to baseline. Psychiatric:        Mood and Affect: Mood is depressed. Affect is blunt and flat.        Cognition and Memory: Memory is impaired.    Review of Systems  Constitutional:  Negative for diaphoresis and fever.       Experiencing lightheadedness.  HENT:  Positive for hearing loss.   Cardiovascular:  Negative for chest pain and palpitations.  Gastrointestinal:  Positive for heartburn. Negative for constipation, diarrhea, nausea and vomiting.  Neurological:  Positive for tremors. Negative for dizziness, weakness and headaches.       Back to baseline tremors  Psychiatric/Behavioral:  Positive for depression. Negative for hallucinations, memory loss and suicidal ideas. The patient is nervous/anxious.    Blood pressure (!) 124/57, pulse (!) 58, temperature 97.6 F (36.4 C), temperature source Oral, resp. rate 20, height 6\' 3"  (1.905 m), weight 94.9 kg, SpO2 98%. Body mass index is 26.15 kg/m.   Treatment Plan Summary: ASSESSMENT & PLAN   ASSESSMENT:   Diagnoses / Active Problems: Major depressive disorder, recurrent severe without psychotic features (HCC) Cocaine use  disorder Alcohol use disorder   Patient IVC due to concerns for safety.  No clear medication recommendations that would improve this presentation as he is already already titrated to high dose on duloxetine and followed outpatient.  Acute presentation appears to be more related to psychosocial issues including housing, financial issues, and relationship issues.  Today he is talking about his goals and neck steps including going to rehab.  He appears to be future oriented and motivated for improvement.  We will continue to emphasize psychosocial factors over any medication regimen changes  Furthermore he is experiencing alcohol withdrawal.  He is on CIWA protocol but we also will schedule Valium 10mg  3x daily and taper.  He is still having breakthrough symptoms on the scheduled Valium so we will continue this front loading treatment.   7/8: Continuing Depakote 500 in the morning and 1000 at night; patient is subtherapeutic but likely due nonadherence with medication.  We will recheck this level before discharge.  Tapering down Valium today.  Withdrawal symptoms have been manageable for him.  CIWA is still in place for breakthrough symptoms.  Working with social work to get him set up with [facility] following this hospitalization.   7/9: Withdrawal symptoms are improving.  We will continue to taper the Valium to once daily tomorrow.  Due to his worsening anxiety we increases his hydroxyzine.  Notify social work about [facility] after hospitalization and we will need to consider a substitute for gabapentin.  Ordering a valproate trough level for tomorrow before his evening dose and will adjust dose if needed.  7/10: Withdrawal symptoms continue to improve he is down only 10 mg tonight and will decrease to 5 mg per night.  [facility]ed a [facility] and he does not need to come off the gabapentin.  Regarding the lightheadedness, bradycardia, and hypotension, we consulted the hospitalist.  Getting valproic acid level tonight  and will modify doses tomorrow if needed.  7/11: With regard to withdrawal symptoms, we will continue to taper medications.  Plan is still to discharge patient to [facility] and we are waiting to hear back from them.  His backup is to go home to his mother's, based on his positive encounter with her yesterday.  Valproate level came back within therapeutic range so we will not make any adjustments.  We will start a PPI for the patient's heartburn and have his PCP follow-up for next steps.  Consulted pharmacy for medications while at a [facility].  Once [facility] is confirmed and we have a timeline, I will ask social work to make a follow-up appointment with Dr. Morrie Sheldon that aligns with the time when he would leave [facility].   PLAN: Safety and Monitoring:             -- Involuntary admission to inpatient psychiatric unit for safety, stabilization and treatment             -- Daily contact with patient to assess and evaluate symptoms and progress in treatment             -- Patient's case to be discussed in multi-disciplinary team meeting             -- Observation Level : q15 minute checks             -- Vital signs:  q12 hours             -- Precautions: suicide, elopement, and assault   2. Psychiatric Diagnoses and Treatment:  -- Continued Depakote 500 mg in the morning and 1000 mg at bedtime.  Valproate level 52.             -- Duloxetine 30 mg 3 times daily for depression --Seroquel 100 mg and trazodone 100 mg at night for sleep -- Continue hydroxyzine 50 mg 3 times daily. --  The risks/benefits/side-effects/alternatives to this medication were discussed in detail with the patient and time was given for questions. The patient consents to medication trial.              -- Metabolic profile and EKG monitoring obtained while on an atypical antipsychotic (BMI, Lipid Panel, HbgA1c, QTc). See #4 below for values.              -- Encouraged patient to participate in unit milieu and in scheduled group  therapies              -- Short Term Goals: Ability to identify changes in lifestyle to reduce recurrence  of condition will improve, Ability to verbalize feelings will improve, Ability to disclose and discuss suicidal ideas, Ability to demonstrate self-control will improve, Ability to identify and develop effective coping behaviors will improve, Ability to maintain clinical measurements within normal limits will improve, Compliance with prescribed medications will improve, and Ability to identify triggers associated with substance abuse/mental health issues will improve             -- Long Term Goals: Improvement in symptoms so as ready for discharge                3. Medical Issues Being Addressed:              -- Alcohol withdrawal: Continue CIWA protocol and scheduled Valium.  Today, Valium down to 5 mg once at night.  If [facility] excepts, we will discontinue tomorrow.  -- GERD: Start pantoprazole 40 once daily for gastroesophageal reflux.             -- Nicotine withdrawal: 14 mg patch once daily, Nicorette gum as needed  -- Encourage oral hydration to prevent orthostatic hypotension.     4. Routine and other pertinent labs reviewed: EKG monitoring: QTc: 462 Valproate level 7/11: 52 Metabolism / endocrine: BMI: Body mass index is 26.15 kg/m. Lipid Panel: Recent Labs       Lab Results  Component Value Date    CHOL 83 04/26/2022    TRIG 52 04/26/2022    HDL 28 (L) 04/26/2022    CHOLHDL 3.0 04/26/2022    VLDL 10 04/26/2022    LDLCALC 45 04/26/2022    LDLCALC 41 08/31/2021      HbgA1c: Last Labs     Hgb A1c MFr Bld (%)  Date Value  04/26/2022 5.2      TSH: Last Labs     TSH (uIU/mL)  Date Value  08/03/2022 0.420  09/08/2020 1.610        Labs to order:    5. Discharge Planning:              -- Social work and case management to assist with discharge planning and identification of hospital follow-up needs prior to discharge             -- Estimated LOS: 7-10 days               -- Discharge Concerns: Need to establish a safety plan; Medication compliance and effectiveness             -- Discharge Goals: Return home with outpatient referrals for mental health follow-up including medication management/psychotherapy     I certify that inpatient services furnished can reasonably be expected to improve the patient's condition.    Meryl Dare, MD 02/02/2023, 12:54 PM

## 2023-02-02 NOTE — Group Note (Signed)
Date:  02/02/2023 Time:  12:10 PM  [facility] LCSW Group Therapy Note   Group Date: @GROUPDATE @ Start Time: @GROUPSTARTTIME @ End Time: @GROUPENDTIME @   Type of Therapy/Topic:  Group Therapy:  Emotion Regulation  Participation Level:    Mood:  Description of Group:    The purpose of this group is to assist patients in learning to regulate negative emotions and experience positive emotions. Patients will be guided to discuss ways in which they have been vulnerable to their negative emotions. These vulnerabilities will be juxtaposed with experiences of positive emotions or situations, and patients challenged to use positive emotions to combat negative ones. Special emphasis will be placed on coping with negative emotions in conflict situations, and patients will process healthy conflict resolution skills.  Therapeutic Goals: Patient will identify two positive emotions or experiences to reflect on in order to balance out negative emotions:  Patient will label two or more emotions that they find the most difficult to experience:  Patient will be able to demonstrate positive conflict resolution skills through discussion or role plays:   Summary of Patient Progress:       Therapeutic Modalities:   Cognitive Behavioral Therapy Feelings Identification Dialectical Behavioral Therapy   Orlanda Frankum M ChriscoGroup Topic/Focus:  Managing Feelings:   The focus of this group is to identify what feelings patients have difficulty handling and develop a plan to handle them in a healthier way upon discharge.    Participation Level:  Active  Participation Quality:  Appropriate  Affect:  Appropriate  Cognitive:  Appropriate  Insight: Appropriate  Engagement in Group:  Engaged  Modes of Intervention:  Clarification, Discussion, and Rapport Building  Additional Comments:    Memory Dance Towana Stenglein 02/02/2023, 12:10 PM

## 2023-02-02 NOTE — Progress Notes (Signed)
[facility] LCSW Progress Note   02/02/2023    3:10 PM  Fernand W Lipton      Type of Note: [facility] referral   Patient was accepted to [facility] for tomorrow @ 9:00 AM and doctor /patient was made aware.     Signed:   Jacob Moores, MSW, LCSWA 02/02/2023 3:10 PM

## 2023-02-02 NOTE — Group Note (Signed)
Date:  02/02/2023 Time:  10:27 PM  Group Topic/Focus:  Wrap-Up Group:   The focus of this group is to help patients review their daily goal of treatment and discuss progress on daily workbooks.    Participation Level:  Active  Participation Quality:  Appropriate  Affect:  Appropriate  Cognitive:  Appropriate  Insight: Appropriate  Engagement in Group:  Engaged  Modes of Intervention:  Clarification and Discussion  Additional Comments:  Patient attend wrap up group  Charna Busman Long 02/02/2023, 10:27 PM

## 2023-02-02 NOTE — Group Note (Unsigned)
Date:  02/02/2023 Time:  10:24 PM  Group Topic/Focus:  Wrap-Up Group:   The focus of this group is to help patients review their daily goal of treatment and discuss progress on daily workbooks.     Participation Level:  {BHH PARTICIPATION LEVEL:22264}  Participation Quality:  {BHH PARTICIPATION QUALITY:22265}  Affect:  {BHH AFFECT:22266}  Cognitive:  {BHH COGNITIVE:22267}  Insight: {BHH Insight2:20797}  Engagement in Group:  {BHH ENGAGEMENT IN GROUP:22268}  Modes of Intervention:  {BHH MODES OF INTERVENTION:22269}  Additional Comments:  ***  Iyanna Drummer Long 02/02/2023, 10:24 PM  

## 2023-02-03 MED ORDER — DULOXETINE HCL 30 MG PO CPEP
90.0000 mg | ORAL_CAPSULE | Freq: Every day | ORAL | 0 refills | Status: DC
Start: 2023-02-03 — End: 2023-04-07

## 2023-02-03 MED ORDER — GABAPENTIN 600 MG PO TABS
600.0000 mg | ORAL_TABLET | Freq: Two times a day (BID) | ORAL | 0 refills | Status: DC
Start: 2023-02-03 — End: 2023-04-07

## 2023-02-03 MED ORDER — ASPIRIN 81 MG PO TBEC: 81.0000 mg | DELAYED_RELEASE_TABLET | Freq: Every day | ORAL | 0 refills | Status: DC

## 2023-02-03 MED ORDER — ALBUTEROL SULFATE HFA 108 (90 BASE) MCG/ACT IN AERS
2.0000 | INHALATION_SPRAY | Freq: Four times a day (QID) | RESPIRATORY_TRACT | 6 refills | Status: DC | PRN
Start: 2023-02-03 — End: 2023-06-20

## 2023-02-03 MED ORDER — QUETIAPINE FUMARATE 100 MG PO TABS
100.0000 mg | ORAL_TABLET | Freq: Every day | ORAL | 0 refills | Status: DC
Start: 2023-02-03 — End: 2023-04-07

## 2023-02-03 MED ORDER — DIVALPROEX SODIUM 500 MG PO DR TAB: DELAYED_RELEASE_TABLET | ORAL | 0 refills | Status: DC

## 2023-02-03 MED ORDER — DICLOFENAC SODIUM 1 % EX GEL: 2.0000 g | Freq: Four times a day (QID) | CUTANEOUS | 0 refills | Status: DC

## 2023-02-03 MED ORDER — NITROGLYCERIN 0.4 MG SL SUBL
0.4000 mg | SUBLINGUAL_TABLET | SUBLINGUAL | 0 refills | Status: AC | PRN
Start: 2023-02-03 — End: 2024-05-06

## 2023-02-03 MED ORDER — BREZTRI AEROSPHERE 160-9-4.8 MCG/ACT IN AERO: 2.0000 | INHALATION_SPRAY | Freq: Two times a day (BID) | RESPIRATORY_TRACT | 0 refills | Status: DC

## 2023-02-03 MED ORDER — PANTOPRAZOLE SODIUM 40 MG PO TBEC: 40.0000 mg | DELAYED_RELEASE_TABLET | Freq: Every day | ORAL | 0 refills | Status: DC

## 2023-02-03 MED ORDER — ATORVASTATIN CALCIUM 80 MG PO TABS
80.0000 mg | ORAL_TABLET | Freq: Every day | ORAL | 0 refills | Status: DC
Start: 2023-02-03 — End: 2024-01-15

## 2023-02-03 MED ORDER — HYDROXYZINE HCL 25 MG PO TABS
25.0000 mg | ORAL_TABLET | Freq: Three times a day (TID) | ORAL | 0 refills | Status: DC | PRN
Start: 2023-02-03 — End: 2023-04-07

## 2023-02-03 MED ORDER — CYCLOBENZAPRINE HCL 10 MG PO TABS: 10.0000 mg | ORAL_TABLET | Freq: Three times a day (TID) | ORAL | 0 refills | Status: DC | PRN

## 2023-02-03 MED ORDER — TRAZODONE HCL 100 MG PO TABS: 100.0000 mg | ORAL_TABLET | Freq: Every evening | ORAL | 0 refills | Status: DC | PRN

## 2023-02-03 NOTE — Discharge Summary (Signed)
Physician Discharge Summary Note  Patient:  Keith Gibbs is an 62 y.o., male MRN:  [id number] DOB:  11-Sep-1960 Patient phone:  [phone] (home)  Patient address:   [address] [place] [address],  Total Time spent with patient: 15 minutes  Date of Admission:  01/28/2023 Date of Discharge: 02/03/23  Reason for Admission:   Expand All Collapse All  Psychiatric Admission Assessment Adult   CC: SI w/ intent and plan   Keith Gibbs is a 62 year old male with a past psychiatric history of schizoaffective, cocaine use disorder, alcohol use disorder; numerous hospitalizations due to substance intoxication/withdrawal, suicidal thoughts/attempts, mania, psychosis; numerous suicide attempts including this January.  He presents to be behavioral health [facility] from the ED for suicidal thoughts with intent and plan (hanging or slit her wrist or OD).   For the past month Keith Gibbs has been experiencing worsening depression symptoms to the point that he feels "my life is shit."  He endorses intent to kill himself and had a plan of hanging himself, cutting his wrist, or overdosing.  Before he attempted, he went to the emergency room.  He has lost his wife to cancer and 3 years ago lost his daughter to a car accident and has since been in current severe major depressive episodes.  Further stressors include finances, being unable to drive, and a troubling relationship with his mother who he lives with.   He discussed wanting to have housing separate from his mother.  He experiences cognitive dissonance around his substance use -- having concerns around use causing financial strain, physical symptoms, psychological issues but seeking rehab at this time.  Principal Problem: Major depressive disorder, recurrent severe without psychotic features [facility]) Discharge Diagnoses: Principal Problem:   Major depressive disorder, recurrent severe without psychotic features (HCC)   Past Psychiatric History:  schizoaffective, cocaine use disorder, alcohol use disorder; numerous hospitalizations due to substance intoxication/withdrawal, suicidal thoughts/attempts, mania, psychosis; numerous suicide attempts including this January   Past Medical History:  Past Medical History:  Diagnosis Date   Anginal pain (HCC)    Anxiety    Arthritis    Bipolar 1 disorder (HCC)    Bursitis    CAD (coronary artery disease)    Chronic pain    COPD (chronic obstructive pulmonary disease) (HCC)    Current use of long term anticoagulation    DAPT (ASA + clopidogrel)   Depression    Diverticulitis    Dyspnea    GERD (gastroesophageal reflux disease)    Grade I diastolic dysfunction    Hepatitis C 2012   No longer has Hep C   HLD (hyperlipidemia)    Hypertension    MI (myocardial infarction) (HCC)    Polysubstance abuse (HCC)    cocaine, marijuana, ETOH   PUD (peptic ulcer disease)    S/P angioplasty with stent 06/10/2016   a.) 90% stenosis of pLAD to mLAD - 2.5 x 18 mm Xience Alpine (DES x 1) placed to pLAD   S/P PTCA (percutaneous transluminal coronary angioplasty) 12/04/2019   a.) 60% in stent restenosis of DES to pLAD; LVEF 65%.   Schizophrenia (HCC)    Stroke [facility])    Valvular insufficiency    a.) Mild MR, TR, PR; mild to moderate AR on 03/05/2018 TTE    Past Surgical History:  Procedure Laterality Date   ABDOMINAL SURGERY     removed small piece of intestines due to [facility] Diverticulosis   ANTERIOR LAT LUMBAR FUSION N/A 08/23/2022   Procedure:  DIRECT LATERAL INTERBODY FUSION  LUMBAR TWO- LUMBAR THREE, LUMBAR THREE-LUMBAR FOUR , EXPLORE AND EXTEND FUSION LUMBAR TWO-LUMBAR FIVE, POSTERIOR DECOMPRESSION LUMBAR THREE-LUMBAR FOUR, LEFT LUMBAR TWO-LUMBAR THREE;  Surgeon: Bedelia Person, MD;  Location: [facility] OR;  Service: Neurosurgery;  Laterality: N/A;   APPENDECTOMY     BACK SURGERY     CARDIAC CATHETERIZATION Left 06/10/2016   Procedure: Left Heart Cath and Coronary Angiography;  Surgeon:  Laurier Nancy, MD;  Location: [facility] INVASIVE CV LAB;  Service: Cardiovascular;  Laterality: Left;   CARDIAC CATHETERIZATION N/A 06/10/2016   Procedure: Coronary Stent Intervention;  Surgeon: Alwyn Pea, MD;  Location: [facility] INVASIVE CV LAB;  Service: Cardiovascular;  Laterality: N/A;   CHOLECYSTECTOMY N/A 02/17/2022   Procedure: LAPAROSCOPIC CHOLECYSTECTOMY;  Surgeon: Sheliah Hatch De Blanch, MD;  Location: [facility] OR;  Service: General;  Laterality: N/A;   COLON SURGERY     COLONOSCOPY     COLONOSCOPY WITH PROPOFOL N/A 01/05/2017   Procedure: COLONOSCOPY WITH PROPOFOL;  Surgeon: Wyline Mood, MD;  Location: [facility] ENDOSCOPY;  Service: Endoscopy;  Laterality: N/A;   COLONOSCOPY WITH PROPOFOL N/A 02/13/2020   Procedure: COLONOSCOPY WITH PROPOFOL;  Surgeon: Pasty Spillers, MD;  Location: [facility] ENDOSCOPY;  Service: Endoscopy;  Laterality: N/A;   CORONARY ANGIOPLASTY WITH STENT PLACEMENT     CORONARY PRESSURE/FFR STUDY N/A 12/04/2019   Procedure: INTRAVASCULAR PRESSURE WIRE/FFR STUDY;  Surgeon: Tonny Bollman, MD;  Location[facility] INVASIVE CV LAB;  Service: Cardiovascular;  Laterality: N/A;   ESOPHAGOGASTRODUODENOSCOPY (EGD) WITH PROPOFOL N/A 01/05/2017   Procedure: ESOPHAGOGASTRODUODENOSCOPY (EGD) WITH PROPOFOL;  Surgeon: Wyline Mood, MD;  Location: [facility] ENDOSCOPY;  Service: Endoscopy;  Laterality: N/A;   ESOPHAGOGASTRODUODENOSCOPY (EGD) WITH PROPOFOL N/A 02/13/2020   Procedure: ESOPHAGOGASTRODUODENOSCOPY (EGD) WITH PROPOFOL;  Surgeon: Pasty Spillers, MD;  Location: [facility] ENDOSCOPY;  Service: Endoscopy;  Laterality: N/A;   KNEE ARTHROSCOPY WITH MEDIAL MENISECTOMY Right 09/05/2017   Procedure: KNEE ARTHROSCOPY WITH MEDIAL AND LATERAL  MENISECTOMY PARTIAL SYNOVECTOMY;  Surgeon: Kennedy Bucker, MD;  Location: [facility] ORS;  Service: Orthopedics;  Laterality: Right;   LEFT HEART CATH AND CORONARY ANGIOGRAPHY N/A 12/04/2019   Procedure: LEFT HEART CATH AND CORONARY ANGIOGRAPHY;  Surgeon: Tonny Bollman,  MD;  Location: [facility] INVASIVE CV LAB;  Service: Cardiovascular;  Laterality: N/A;   LEFT HEART CATH AND CORONARY ANGIOGRAPHY Left 11/25/2022   Procedure: LEFT HEART CATH AND CORONARY ANGIOGRAPHY;  Surgeon: Laurier Nancy, MD;  Location: [facility] INVASIVE CV LAB;  Service: Cardiovascular;  Laterality: Left;   SHOULDER SURGERY Right 04/09/2012   SPINE SURGERY     TOTAL KNEE ARTHROPLASTY Right 01/19/2021   Procedure: TOTAL KNEE ARTHROPLASTY - [place] Neighbor to Assist;  Surgeon: Kennedy Bucker, MD;  Location: [facility] ORS;  Service: Orthopedics;  Laterality: Right;   Family History:  Family History  Problem Relation Age of Onset   Osteoarthritis Mother    Heart disease Mother    Hypertension Mother    Depression Mother    Heart disease Father    Early death Father    Hypertension Father    Heart attack Father    Hypertension Sister    Parkinson's disease Maternal Grandfather    Prostate cancer Neg Hx    Bladder Cancer Neg Hx    Kidney cancer Neg Hx    Tremor Neg Hx    Family Psychiatric  History: see history and phsical Social History:  Social History   Substance and Sexual Activity  Alcohol Use Yes   Alcohol/week: 3.0 standard drinks of alcohol  Types: 3 Cans of beer per week   Comment: occassionally      Social History   Substance and Sexual Activity  Drug Use Yes   Types: Cocaine, Marijuana   Comment: last on saturday    Social History   Socioeconomic History   Marital status: Widowed    Spouse name: Not on file   Number of children: Not on file   Years of education: Not on file   Highest education level: Not on file  Occupational History   Occupation: disability   Occupation: stocking at gas station    Comment: 2 days per week  Tobacco Use   Smoking status: Every Day    Current packs/day: 0.00    Average packs/day: 0.5 packs/day for 32.0 years (16.0 ttl pk-yrs)    Types: Cigarettes    Start date: 10/25/1989    Last attempt to quit: 10/25/2021    Years since quitting: 1.2     Passive exposure: Current   Smokeless tobacco: Never   Tobacco comments:    Continues to smoke approximately 5 cigarettes per day.  Has been 2 days since las cigarette.  Has not bought any more.  Using patches and gum.  5/23 hfb  Vaping Use   Vaping status: Never Used  Substance and Sexual Activity   Alcohol use: Yes    Alcohol/week: 3.0 standard drinks of alcohol    Types: 3 Cans of beer per week    Comment: occassionally    Drug use: Yes    Types: Cocaine, Marijuana    Comment: last on saturday   Sexual activity: Yes    Partners: Female    Birth control/protection: None  Other Topics Concern   Not on file  Social History Narrative   Not on file   Social Determinants of Health   Financial Resource Strain: Not on file  Food Insecurity: No Food Insecurity (01/29/2023)   Hunger Vital Sign    Worried About Running Out of Food in the Last Year: Never true    Ran Out of Food in the Last Year: Never true  Transportation Needs: No Transportation Needs (01/29/2023)   PRAPARE - Administrator, Civil Service (Medical): No    Lack of Transportation (Non-Medical): No  Physical Activity: Not on file  Stress: Not on file  Social Connections: Not on file    Hospital Course:   Patient had an uneventful course of alcohol withdrawal during which we front loaded diazepam treatment and tapered. He is currently without symptoms. His depression has slowly improved during the course to where he is now experiencing few sypmtoms of depression and no suicidal thoughts.   Hospital course:  During the patient's hospitalization, patient had extensive initial psychiatric evaluation, and follow-up psychiatric evaluations every day.  Psychiatric diagnoses provided upon initial assessment: alcohol withdrawal; MDD, severe; schizoaffective, bipolar type  Patient's psychiatric medications were adjusted on admission: restarted patients home medications without adjustments.   During the  hospitalization, other adjustments were made to the patient's psychiatric medication regimen: increased hydroxyine from 25mg  to 50mg  three times daily.   Patient's care was discussed during the interdisciplinary team meeting every day during the hospitalization.  The patient is not having side effects to prescribed psychiatric medication.  Gradually, patient started adjusting to milieu. The patient was evaluated each day by a clinical provider to ascertain response to treatment. Improvement was noted by the patient's report of decreasing symptoms, improved sleep and appetite, affect, medication tolerance, behavior, and participation  in unit programming.  Patient was asked each day to complete a self inventory noting mood, mental status, pain, new symptoms, anxiety and concerns.   Symptoms were reported as significantly decreased or resolved completely by discharge.  The patient reports that their mood is stable.  The patient denied having suicidal thoughts for more than 48 hours prior to discharge.  Patient denies having homicidal thoughts.  Patient denies having auditory hallucinations.  Patient denies any visual hallucinations or other symptoms of psychosis.  The patient was motivated to continue taking medication with a goal of continued improvement in mental health.   The patient reports their target psychiatric symptoms responded well to the psychiatric medications, and the patient reports overall benefit other psychiatric hospitalization. Supportive psychotherapy was provided to the patient. The patient also participated in regular group therapy while hospitalized. Coping skills, problem solving as well as relaxation therapies were also part of the unit programming.  Labs were reviewed with the patient, and abnormal results were discussed with the patient.  The patient is able to verbalize their individual safety plan to this provider.  # It is recommended to the patient to continue  psychiatric medications as prescribed, after discharge from the hospital.    # It is recommended to the patient to follow up with your outpatient psychiatric provider and PCP.  # It was discussed with the patient, the impact of alcohol, drugs, tobacco have been there overall psychiatric and medical wellbeing, and total abstinence from substance use was recommended the patient.ed.  # Prescriptions provided or sent directly to preferred pharmacy at discharge. Patient agreeable to plan. Given opportunity to ask questions. Appears to feel comfortable with discharge.    # In the event of worsening symptoms, the patient is instructed to call the crisis hotline, 911 and or go to the nearest ED for appropriate evaluation and treatment of symptoms. To follow-up with primary care provider for other medical issues, concerns and or health care needs  # Patient was discharged [facility] with a plan to follow up as noted below.    On day of discharge patient is experiecing no symptoms of depression or alcohol withdrawal. He reported good sleep and apetite. No SI, HI, AHV.     Physical Findings: AIMS:  , ,  ,  ,    CIWA:  CIWA-Ar Total: 8 COWS:     Musculoskeletal: Strength & Muscle Tone: within normal limits Gait & Station: normal Patient leans: N/A   Psychiatric Specialty Exam:  Presentation  General Appearance:  Appropriate for Environment  Eye Contact: Good  Speech: Normal Rate  Speech Volume: Normal  Handedness: Right   Mood and Affect  Mood: Euthymic  Affect: Appropriate   Thought Process  Thought Processes: Coherent  Descriptions of Associations:Intact  Orientation:Full (Time, Place and Person)  Thought Content:Logical  History of Schizophrenia/Schizoaffective disorder:No data recorded Duration of Psychotic Symptoms:No data recorded Hallucinations:Hallucinations: None  Ideas of Reference:None  Suicidal Thoughts:Suicidal Thoughts: No  Homicidal  Thoughts:Homicidal Thoughts: No   Sensorium  Memory: Immediate Good; Recent Good; Remote Good  Judgment: Good  Insight: Good   Executive Functions  Concentration: Good  Attention Span: Good  Recall: Good  Fund of Knowledge: Good  Language: Good   Psychomotor Activity  Psychomotor Activity: Psychomotor Activity: Normal   Assets  Assets: Communication Skills; Desire for Improvement; Housing   Sleep  Sleep: Sleep: Good Number of Hours of Sleep: 8    Physical Exam: Physical Exam Vitals and nursing note reviewed.  HENT:  Head: Normocephalic and atraumatic.  Pulmonary:     Effort: Pulmonary effort is normal.  Neurological:     General: No focal deficit present.     Mental Status: He is alert.     Motor: Tremor (baseline tremor) present.    ROS Blood pressure 104/61, pulse (!) 48, temperature 97.7 F (36.5 C), temperature source Oral, resp. rate 16, height 6\' 3"  (1.905 m), weight 94.9 kg, SpO2 98%. Body mass index is 26.15 kg/m.   Social History   Tobacco Use  Smoking Status Every Day   Current packs/day: 0.00   Average packs/day: 0.5 packs/day for 32.0 years (16.0 ttl pk-yrs)   Types: Cigarettes   Start date: 10/25/1989   Last attempt to quit: 10/25/2021   Years since quitting: 1.2   Passive exposure: Current  Smokeless Tobacco Never  Tobacco Comments   Continues to smoke approximately 5 cigarettes per day.  Has been 2 days since las cigarette.  Has not bought any more.  Using patches and gum.  5/23 hfb   Tobacco Cessation:  A prescription for an FDA-approved tobacco cessation medication was offered at discharge and the patient refused   Blood Alcohol level:  Lab Results  Component Value Date   [facility] <10 01/28/2023   ETH <10 08/03/2022    Metabolic Disorder Labs:  Lab Results  Component Value Date   HGBA1C 5.2 04/26/2022   MPG 102.54 04/26/2022   MPG 103 12/28/2016   No results found for: "PROLACTIN" Lab Results  Component  Value Date   CHOL 83 04/26/2022   TRIG 52 04/26/2022   HDL 28 (L) 04/26/2022   CHOLHDL 3.0 04/26/2022   VLDL 10 04/26/2022   LDLCALC 45 04/26/2022   LDLCALC 41 08/31/2021    See Psychiatric Specialty Exam and Suicide Risk Assessment completed by Attending Physician prior to discharge.  Discharge destination:  [facility]  Is patient on multiple antipsychotic therapies at discharge:  No   Has Patient had three or more failed trials of antipsychotic monotherapy by history:  No  Recommended Plan for Multiple Antipsychotic Therapies: NA   Allergies as of 02/03/2023       Reactions   Asenapine Other (See Comments), Nausea And Vomiting   Increased tremors   Latuda [lurasidone Hcl] Other (See Comments)   Tremors   Lurasidone Other (See Comments)        Medication List     TAKE these medications      Indication  albuterol 108 (90 Base) MCG/ACT inhaler Commonly known as: VENTOLIN HFA Inhale 2 puffs into the lungs every 6 (six) hours as needed for wheezing or shortness of breath.  Indication: Asthma   aspirin EC 81 MG tablet Take 1 tablet (81 mg total) by mouth daily. Swallow whole.  Indication: Disease involving Lipid Deposits in the Arteries, Stroke Due To Limited Blood Flow   atorvastatin 80 MG tablet Commonly known as: LIPITOR Take 1 tablet (80 mg total) by mouth daily.  Indication: Disease involving Lipid Deposits in the Arteries   Breztri Aerosphere 160-9-4.8 MCG/ACT Aero Generic drug: Budeson-Glycopyrrol-Formoterol Inhale 2 puffs into the lungs in the morning and at bedtime.  Indication: Chronic Obstructive Lung Disease   cyclobenzaprine 10 MG tablet Commonly known as: FLEXERIL Take 1 tablet (10 mg total) by mouth 3 (three) times daily as needed for muscle spasms.  Indication: Muscle Spasm   diclofenac Sodium 1 % Gel Commonly known as: VOLTAREN Apply 2 g topically 4 (four) times daily.  Indication: Joint Damage causing  Pain and Loss of Function    divalproex 500 MG DR tablet Commonly known as: DEPAKOTE Take 1 tablet in the morning and 2 tablets at bedtime What changed:  how much to take how to take this when to take this additional instructions  Indication: MIXED BIPOLAR AFFECTIVE DISORDER   DULoxetine 30 MG capsule Commonly known as: CYMBALTA Take 3 capsules (90 mg total) by mouth daily.  Indication: Major Depressive Disorder   gabapentin 600 MG tablet Commonly known as: Neurontin Take 1 tablet (600 mg total) by mouth 2 (two) times daily.  Indication: Neuropathic Pain   hydrOXYzine 25 MG tablet Commonly known as: ATARAX Take 1 tablet (25 mg total) by mouth 3 (three) times daily as needed for anxiety.  Indication: Feeling Anxious   nitroGLYCERIN 0.4 MG SL tablet Commonly known as: Nitrostat Place 1 tablet (0.4 mg total) under the tongue every 5 (five) minutes as needed for chest pain.  Indication: Acute Angina Pectoris   pantoprazole 40 MG tablet Commonly known as: PROTONIX Take 1 tablet (40 mg total) by mouth daily. Start taking on: February 04, 2023  Indication: Heartburn   QUEtiapine 100 MG tablet Commonly known as: SEROQUEL Take 1 tablet (100 mg total) by mouth at bedtime.  Indication: Schizophrenia   traZODone 100 MG tablet Commonly known as: DESYREL Take 1 tablet (100 mg total) by mouth at bedtime as needed for sleep.  Indication: Trouble Sleeping        Follow-up Information     [facility]. Go to.   Specialty: Behavioral Health Why: Please go to this provider for therapy and medication management services. For fastest service, please go to this provider on Monday through Friday, by 7:00 am. Contact information: [address] [place] [address] [phone]        Services, [facility]. Go on 02/03/2023.   Why: A referral was made on your behalf and you were accepted to this residential. You will go door to door tomorrow before 9:00 AM. Contact  information: [address] [place] [address] [phone]                 Follow-up recommendations:   Activity: as tolerated  Diet: heart healthy  Other: -Follow-up with your outpatient psychiatric provider -instructions on appointment date, time, and address (location) are provided to you in discharge paperwork.  -Take your psychiatric medications as prescribed at discharge - instructions are provided to you in the discharge paperwork  -Follow-up with outpatient primary care doctor and other specialists -for management of chronic medical disease.   -Testing: Follow-up with outpatient provider for abnormal lab.   -Recommend abstinence from alcohol, tobacco, and other illicit drug use at discharge.   -If your psychiatric symptoms recur, worsen, or if you have side effects to your psychiatric medications, call your outpatient psychiatric provider, 911, [phone] or go to the nearest emergency department.  -If suicidal thoughts recur, call your outpatient psychiatric provider, 911, [phone] or go to the nearest emergency department.   Signed: Meryl Dare, MD 02/03/2023, 8:27 AM

## 2023-02-03 NOTE — Transportation (Signed)
02/03/2023  Keith Gibbs DOB: [birth year] MRN: [id number]   RIDER WAIVER AND RELEASE OF LIABILITY  For the purposes of helping with transportation needs, [place] partners with outside transportation providers (taxi companies, [place], Catering manager.) to give [employer] patients or other approved people the choice of on-demand rides [employer]") to our buildings for non-emergency visits.  By using [employer], I, the person signing this document, on behalf of myself and/or any legal minors (in my care using the [employer]), agree:  Science writer given to me are supplied by independent, outside transportation providers who do not work for, or have any affiliation with, [employer]. [place] is not a transportation company. [place] has no control over the quality or safety of the rides I get using [employer]. [place] has no control over whether any outside ride will happen on time or not. [place] gives no guarantee on the reliability, quality, safety, or availability on any rides, or that no mistakes will happen. I know and accept that traveling by vehicle (car, truck, SVU, Zenaida Niece, bus, taxi, etc.) has risks of serious injuries such as disability, being paralyzed, and death. I know and agree the risk of using [employer] is mine alone, and not [employer]. Transport Services are provided "as is" and as are available. The transportation providers are in charge for all inspections and care of the vehicles used to provide these rides. I agree not to take legal action against [facility], its agents, employees, officers, directors, representatives, insurers, attorneys, assigns, successors, subsidiaries, and affiliates at any time for any reasons related directly or indirectly to using [employer]. I also agree not to take legal action against [place] or its affiliates for any injury, death, or damage to property caused by or related to using  [employer]. I have read this Waiver and Release of Liability, and I understand the terms used in it and their legal meaning. This Waiver is freely and voluntarily given with the understanding that my right (or any legal minors) to legal action against  relating to [employer] is knowingly given up to use these services.   I attest that I read the Ride Waiver and Release of Liability to [name], gave Mr. Greenblatt the opportunity to ask questions and answered the questions asked (if any). I affirm that LEVERE FAHRER then provided consent for assistance with transportation.

## 2023-02-03 NOTE — BHH Suicide Risk Assessment (Signed)
Suicide Risk Assessment  Discharge Assessment    [facility] Discharge Suicide Risk Assessment   Principal Problem: Major depressive disorder, recurrent severe without psychotic features [facility]) Discharge Diagnoses: Principal Problem:   Major depressive disorder, recurrent severe without psychotic features (HCC)  Keith Gibbs is a 62 year old male with a past psychiatric history of schizoaffective, cocaine use disorder, alcohol use disorder; numerous hospitalizations due to substance intoxication/withdrawal, suicidal thoughts/attempts, mania, psychosis; numerous suicide attempts including this January.  He presents to be behavioral health [facility] from the ED for suicidal thoughts with intent and plan (hanging or slit her wrist or OD).   For the past month Keith Gibbs has been experiencing worsening depression symptoms to the point that he feels "my life is shit."  He endorses intent to kill himself and had a plan of hanging himself, cutting his wrist, or overdosing.  Before he attempted, he went to the emergency room.  He has lost his wife to cancer and 3 years ago lost his daughter to a car accident and has since been in current severe major depressive episodes.  Further stressors include finances, being unable to drive, and a troubling relationship with his mother who he lives with.   He discussed wanting to have housing separate from his mother.  He experiences cognitive dissonance around his substance use -- having concerns around use causing financial strain, physical symptoms, psychological issues but seeking rehab at this time.  Hospital Course:   Patient had an uneventful course of alcohol withdrawal during which we front loaded diazepam treatment and tapered. He is currently without symptoms. His depression has slowly improved during the course to where he is now experiencing few sypmtoms of depression and no suicidal thoughts.    Hospital course:   During the patient's hospitalization, patient had  extensive initial psychiatric evaluation, and follow-up psychiatric evaluations every day.   Psychiatric diagnoses provided upon initial assessment: alcohol withdrawal; MDD, severe; schizoaffective, bipolar type   Patient's psychiatric medications were adjusted on admission: restarted patients home medications without adjustments.    During the hospitalization, other adjustments were made to the patient's psychiatric medication regimen: increased hydroxyine from 25mg  to 50mg  three times daily.    Patient's care was discussed during the interdisciplinary team meeting every day during the hospitalization.   The patient is not having side effects to prescribed psychiatric medication.   Gradually, patient started adjusting to milieu. The patient was evaluated each day by a clinical provider to ascertain response to treatment. Improvement was noted by the patient's report of decreasing symptoms, improved sleep and appetite, affect, medication tolerance, behavior, and participation in unit programming.  Patient was asked each day to complete a self inventory noting mood, mental status, pain, new symptoms, anxiety and concerns.   Symptoms were reported as significantly decreased or resolved completely by discharge.  The patient reports that their mood is stable.  The patient denied having suicidal thoughts for more than 48 hours prior to discharge.  Patient denies having homicidal thoughts.  Patient denies having auditory hallucinations.  Patient denies any visual hallucinations or other symptoms of psychosis.  The patient was motivated to continue taking medication with a goal of continued improvement in mental health.    The patient reports their target psychiatric symptoms responded well to the psychiatric medications, and the patient reports overall benefit other psychiatric hospitalization. Supportive psychotherapy was provided to the patient. The patient also participated in regular group therapy  while hospitalized. Coping skills, problem solving as well as relaxation therapies were also  part of the unit programming.   Labs were reviewed with the patient, and abnormal results were discussed with the patient.   The patient is able to verbalize their individual safety plan to this provider.   # It is recommended to the patient to continue psychiatric medications as prescribed, after discharge from the hospital.     # It is recommended to the patient to follow up with your outpatient psychiatric provider and PCP.   # It was discussed with the patient, the impact of alcohol, drugs, tobacco have been there overall psychiatric and medical wellbeing, and total abstinence from substance use was recommended the patient.ed.   # Prescriptions provided or sent directly to preferred pharmacy at discharge. Patient agreeable to plan. Given opportunity to ask questions. Appears to feel comfortable with discharge.    # In the event of worsening symptoms, the patient is instructed to call the crisis hotline, 911 and or go to the nearest ED for appropriate evaluation and treatment of symptoms. To follow-up with primary care provider for other medical issues, concerns and or health care needs   # Patient was discharged [facility] with a plan to follow up as noted below.       On day of discharge patient is experiecing no symptoms of depression or alcohol withdrawal. He reported good sleep and apetite. No SI, HI, AHV.    Total Time spent with patient: 15 minutes  Musculoskeletal: Strength & Muscle Tone: within normal limits Gait & Station: normal Patient leans: N/A  Psychiatric Specialty Exam  Presentation  General Appearance:  Appropriate for Environment  Eye Contact: Good  Speech: Normal Rate  Speech Volume: Normal  Handedness: Right   Mood and Affect  Mood: Euthymic  Duration of Depression Symptoms: No data recorded Affect: Appropriate   Thought Process  Thought  Processes: Coherent  Descriptions of Associations:Intact  Orientation:Full (Time, Place and Person)  Thought Content:Logical  History of Schizophrenia/Schizoaffective disorder:No data recorded Duration of Psychotic Symptoms:No data recorded Hallucinations:Hallucinations: None  Ideas of Reference:None  Suicidal Thoughts:Suicidal Thoughts: No  Homicidal Thoughts:Homicidal Thoughts: No   Sensorium  Memory: Immediate Good; Recent Good; Remote Good  Judgment: Good  Insight: Good   Executive Functions  Concentration: Good  Attention Span: Good  Recall: Good  Fund of Knowledge: Good  Language: Good   Psychomotor Activity  Psychomotor Activity: Psychomotor Activity: Normal   Assets  Assets: Communication Skills; Desire for Improvement; Housing   Sleep  Sleep: Sleep: Good Number of Hours of Sleep: 8   Physical Exam: Physical Exam ROS Blood pressure 104/61, pulse (!) 48, temperature 97.7 F (36.5 C), temperature source Oral, resp. rate 16, height 6\' 3"  (1.905 m), weight 94.9 kg, SpO2 98%. Body mass index is 26.15 kg/m.  Mental Status Per Nursing Assessment::   On Admission:  Suicidal ideation indicated by patient, Suicide plan  Demographic Factors:  Male, Caucasian, and Unemployed  Loss Factors: Decline in physical health and Financial problems/change in socioeconomic status  Historical Factors: Prior suicide attempts  Risk Reduction Factors:   Sense of responsibility to family and Living with another person, especially a relative  Continued Clinical Symptoms:  Minimal depression symptoms  Cognitive Features That Contribute To Risk:  None    Suicide Risk:  Minimal: No identifiable suicidal ideation.  Patients presenting with no risk factors but with morbid ruminations; may be classified as minimal risk based on the severity of the depressive symptoms   Follow-up Information     [facility].  Go to.    Specialty: Behavioral Health Why: Please go to this provider for therapy and medication management services. For fastest service, please go to this provider on Monday through Friday, by 7:00 am. Contact information: [address] [place] [address] [phone]        Services, [facility]. Go on 02/03/2023.   Why: A referral was made on your behalf and you were accepted to this residential. You will go door to door tomorrow before 9:00 AM. Contact information: [address] [place] [address] [phone]                 Plan Of Care/Follow-up recommendations:  Activity: as tolerated  Diet: heart healthy  Other: -Follow-up with your outpatient psychiatric provider -instructions on appointment date, time, and address (location) are provided to you in discharge paperwork.  -Take your psychiatric medications as prescribed at discharge - instructions are provided to you in the discharge paperwork  -Follow-up with outpatient primary care doctor and other specialists -for management of chronic medical disease  -Testing: Follow-up with outpatient provider for abnormal lab  -Recommend abstinence from alcohol, tobacco, and other illicit drug use at discharge.   -If your psychiatric symptoms recur, worsen, or if you have side effects to your psychiatric medications, call your outpatient psychiatric provider, 911, [phone] or go to the nearest emergency department.  -If suicidal thoughts recur, call your outpatient psychiatric provider, 911, 988 or go to the nearest emergency department.   Meryl Dare, MD 02/03/2023, 8:29 AM

## 2023-02-03 NOTE — Plan of Care (Signed)
  Problem: Education: Goal: Knowledge of [facility] Education information/materials will improve Outcome: Progressing Goal: Mental status will improve Outcome: Progressing   Problem: Activity: Goal: Interest or engagement in activities will improve Outcome: Progressing   Problem: Health Behavior/Discharge Planning: Goal: Identification of resources available to assist in meeting health care needs will improve Outcome: Progressing   Problem: Education: Goal: Utilization of techniques to improve thought processes will improve Outcome: Progressing   Problem: Coping: Goal: Coping ability will improve Outcome: Progressing   Problem: Health Behavior/Discharge Planning: Goal: Compliance with therapeutic regimen will improve Outcome: Progressing   Problem: Education: Goal: Ability to make informed decisions regarding treatment will improve Outcome: Progressing   Problem: Coping: Goal: Coping ability will improve Outcome: Progressing   Problem: Medication: Goal: Compliance with prescribed medication regimen will improve Outcome: Progressing   Problem: Self-Concept: Goal: Ability to disclose and discuss suicidal ideas will improve Outcome: Progressing Goal: Will verbalize positive feelings about self Outcome: Progressing   Problem: Education: Goal: Ability to state activities that reduce stress will improve Outcome: Progressing   Problem: Coping: Goal: Ability to identify and develop effective coping behavior will improve Outcome: Progressing   Problem: Self-Concept: Goal: Level of anxiety will decrease Outcome: Progressing   Problem: Education: Goal: Knowledge of disease or condition will improve Outcome: Progressing Goal: Understanding of discharge needs will improve Outcome: Progressing   Problem: Health Behavior/Discharge Planning: Goal: Ability to identify changes in lifestyle to reduce recurrence of condition will improve Outcome:  Progressing Goal: Identification of resources available to assist in meeting health care needs will improve Outcome: Progressing   Problem: Physical Regulation: Goal: Complications related to the disease process, condition or treatment will be avoided or minimized Outcome: Progressing   Problem: Safety: Goal: Ability to remain free from injury will improve Outcome: Progressing

## 2023-02-03 NOTE — Plan of Care (Signed)
  Problem: Education: Goal: Knowledge of [facility] Education information/materials will improve Outcome: Adequate for Discharge Goal: Mental status will improve Outcome: Adequate for Discharge   Problem: Activity: Goal: Interest or engagement in activities will improve Outcome: Adequate for Discharge   Problem: Health Behavior/Discharge Planning: Goal: Identification of resources available to assist in meeting health care needs will improve Outcome: Adequate for Discharge   Problem: Education: Goal: Utilization of techniques to improve thought processes will improve Outcome: Adequate for Discharge   Problem: Coping: Goal: Coping ability will improve Outcome: Adequate for Discharge   Problem: Health Behavior/Discharge Planning: Goal: Compliance with therapeutic regimen will improve Outcome: Adequate for Discharge   Problem: Education: Goal: Ability to make informed decisions regarding treatment will improve Outcome: Adequate for Discharge   Problem: Coping: Goal: Coping ability will improve Outcome: Adequate for Discharge   Problem: Medication: Goal: Compliance with prescribed medication regimen will improve Outcome: Adequate for Discharge   Problem: Self-Concept: Goal: Ability to disclose and discuss suicidal ideas will improve Outcome: Adequate for Discharge Goal: Will verbalize positive feelings about self Outcome: Adequate for Discharge   Problem: Education: Goal: Ability to state activities that reduce stress will improve Outcome: Adequate for Discharge   Problem: Coping: Goal: Ability to identify and develop effective coping behavior will improve Outcome: Adequate for Discharge   Problem: Self-Concept: Goal: Level of anxiety will decrease Outcome: Adequate for Discharge   Problem: Education: Goal: Knowledge of disease or condition will improve Outcome: Adequate for Discharge Goal: Understanding of discharge needs will improve Outcome:  Adequate for Discharge   Problem: Health Behavior/Discharge Planning: Goal: Ability to identify changes in lifestyle to reduce recurrence of condition will improve Outcome: Adequate for Discharge Goal: Identification of resources available to assist in meeting health care needs will improve Outcome: Adequate for Discharge   Problem: Physical Regulation: Goal: Complications related to the disease process, condition or treatment will be avoided or minimized Outcome: Adequate for Discharge   Problem: Safety: Goal: Ability to remain free from injury will improve Outcome: Adequate for Discharge

## 2023-02-03 NOTE — Progress Notes (Signed)
Patient discharged via taxi. Denies SI/HI/AVH and pain. Patient denies any complaints and pleasant upon discharge.

## 2023-02-03 NOTE — Discharge Instructions (Signed)
Dear [name],  It was a pleasure to take care of you during your stay at [facility] where you were treated for your Major depressive disorder, recurrent severe without psychotic features (HCC).  While you were here, you were:  observed and cared for by our nurses and nursing assistants  treated with medications by your psychiatrists  provided individual and group therapy by therapists  provided resources by our social workers and case managers  Please review the medication list provided to you at discharge and stop, start taking, or continue taking the medications listed there.  You should also follow-up with your primary care doctor, or start seeing one if you don't have one yet. If applicable, here are some scheduled follow-ups for you:  Follow-up Information     [facility]. Go to.   Specialty: Behavioral Health Why: Please go to this provider for therapy and medication management services. For fastest service, please go to this provider on Monday through Friday, by 7:00 am. Contact information: [address] [place] [address] [phone]        Services, [facility]. Go on 02/03/2023.   Why: A referral was made on your behalf and you were accepted to this residential. You will go door to door tomorrow before 9:00 AM. Contact information: [address] [place] [address] [phone]                  I recommend abstinence from alcohol, tobacco, and other illicit drug use.   If your psychiatric symptoms or suicidal thoughts recur, worsen, or if you have side effects to your psychiatric medications, call your outpatient psychiatric provider, 911, 988 or go to the nearest emergency department.  Take care!  Signed: Meryl Dare, MD 02/03/2023, 8:19 AM  Naloxone (Narcan) can help reverse an overdose when given to the victim quickly.  [place] offers free naloxone kits and  instructions/training on its use.  Add naloxone to your first aid kit and you can help save a life. A prescription can be filled at your local pharmacy or free kits are provided by the county.  Pick up your free kit at the following locations:   [place]:  [facility], [address] [place] [address] [phone]) [facility] [address] [place] [address] [phone]) [facility] [address] [place] [address]  [place]: [facility] [address] [place] [address] ([phone]) [facility] [address] [place] [address] [phone])

## 2023-02-06 NOTE — Progress Notes (Deleted)
Assessment/Plan:   1.  Essential Tremor.  -Referral today was to discuss candidacy for DBS therapy.  Unfortunately, the patient is not a candidate.  He was just in the emergency room with suicidal ideation, and uncontrolled depression is a contraindication to DBS therapy.  This is true for focused ultrasound as well.  In addition to this, the patient is smoking, and this impedes healing and we would require that patient stop smoking prior to any surgical intervention.  In addition, the patient is on a tremor inducing medication, Depakote.  We generally do not do DBS therapy on patients who are on tremor inducing medications.  Finally, the patient was in the hospital in January and drug screen was positive for cocaine and benzodiazepines.  Patient does have history of drug abuse.  This also is a contraindication to DBS therapy.  I do think he could be a candidate for primidone, but would certainly leave this to his primary neurologist.   Subjective:   Keith Gibbs was seen in consultation in the movement disorder clinic at the request of Keith Foley, MD.  The evaluation is for candidacy for DBS for essential tremor.  Medical records made available to me are reviewed.  Patient has only seen [facility] neurology 1 time on December 29, 2022.  Patient is a 62 year old male with a history of hypertension, hyperlipidemia, COPD, hepatitis C, schizophrenia, history of substance use disorder, chronic pain, depression, suicidal ideation who presents for the evaluation of tremor.  Patient reports he has had tremor for ***.  Tremor is located in the bilateral upper extremities.  He is ***R hand dominant.  He reports that maternal grandfather had Parkinson's. Affected by caffeine:  {yes no:314532} Affected by alcohol:  {yes no:314532} Affected by stress:  {yes no:314532} Affected by fatigue:  {yes no:314532} Spills soup if on spoon:  {yes no:314532} Spills glass of liquid if full:  {yes no:314532} Affects ADL's  (tying shoes, brushing teeth, etc):  {yes no:314532}  Current/Previously tried tremor medications: ***  Current medications that may exacerbate tremor:  ***Albuterol, Depakote  Outside reports reviewed: {Outside review:15817}.  Allergies  Allergen Reactions   Asenapine Other (See Comments) and Nausea And Vomiting    Increased tremors   Latuda [Lurasidone Hcl] Other (See Comments)    Tremors     Lurasidone Other (See Comments)    No outpatient medications have been marked as taking for the 02/08/23 encounter (Appointment) with , Keith Batty, DO.      Objective:   VITALS:  There were no vitals filed for this visit. Gen:  Appears stated age and in NAD. HEENT:  Normocephalic, atraumatic. The mucous membranes are moist. The superficial temporal arteries are without ropiness or tenderness. Cardiovascular: Regular rate and rhythm. Lungs: Clear to auscultation bilaterally. Neck: There are no carotid bruits noted bilaterally.  NEUROLOGICAL:  Orientation:  The patient is alert and oriented x 3.   Cranial nerves: There is good facial symmetry. Extraocular muscles are intact and visual fields are full to confrontational testing. Speech is fluent and clear. Soft palate rises symmetrically and there is no tongue deviation. Hearing is intact to conversational tone. Tone: Tone is good throughout. Sensation: Sensation is intact to light touch touch throughout (facial, trunk, extremities). Vibration is intact at the bilateral big toe. There is no extinction with double simultaneous stimulation. There is no sensory dermatomal level identified. Coordination:  The patient has no dysdiadichokinesia or dysmetria. Motor: Strength is 5/5 in the bilateral upper and lower extremities.  Shoulder  shrug is equal bilaterally.  There is no pronator drift.  There are no fasciculations noted. DTR's: Deep tendon reflexes are 2/4 at the bilateral biceps, triceps, brachioradialis, patella and achilles.  Plantar  responses are downgoing bilaterally. Gait and Station: The patient is able to ambulate without difficulty. The patient is able to heel toe walk without any difficulty. The patient is able to ambulate in a tandem fashion. The patient is able to stand in the Romberg position.   MOVEMENT EXAM: Tremor:  There is *** tremor in the UE, noted most significantly with action.  The patient is *** able to draw Archimedes spirals without significant difficulty.  There is *** tremor at rest.  The patient is *** able to pour water from one glass to another without spilling it.  I have reviewed and interpreted the following labs independently   Chemistry      Component Value Date/Time   NA 133 (L) 01/28/2023 0730   NA 136 01/11/2023 0000   K 4.2 01/28/2023 0730   CL 100 01/28/2023 0730   CL 103 05/11/2017 0000   CO2 22 01/28/2023 0730   CO2 23 05/11/2017 0000   BUN 15 01/28/2023 0730   BUN 7 (L) 01/11/2023 0000   CREATININE 0.89 01/28/2023 0730   GLU 87 05/11/2017 0000      Component Value Date/Time   CALCIUM 8.8 (L) 01/28/2023 0730   ALKPHOS 60 01/28/2023 0730   AST 23 01/28/2023 0730   ALT 19 01/28/2023 0730   BILITOT 1.0 01/28/2023 0730   BILITOT 0.4 01/11/2023 0000      Lab Results  Component Value Date   WBC 14.2 (H) 01/28/2023   HGB 16.0 01/28/2023   HCT 46.4 01/28/2023   MCV 94.3 01/28/2023   PLT 189 01/28/2023   Lab Results  Component Value Date   TSH 0.420 08/03/2022      Total time spent on today's visit was ***60 minutes, including both face-to-face time and nonface-to-face time.  Time included that spent on review of records (prior notes available to me/labs/imaging if pertinent), discussing treatment and goals, answering patient's questions and coordinating care.  CC:  Rema Fendt, NP

## 2023-02-08 ENCOUNTER — Ambulatory Visit: Payer: Medicaid Other | Admitting: Neurology

## 2023-02-13 ENCOUNTER — Ambulatory Visit: Payer: MEDICAID | Admitting: Physician Assistant

## 2023-02-13 VITALS — BP 102/62 | HR 49 | Ht 75.0 in | Wt 229.0 lb

## 2023-02-13 DIAGNOSIS — Z125 Encounter for screening for malignant neoplasm of prostate: Secondary | ICD-10-CM

## 2023-02-13 DIAGNOSIS — R42 Dizziness and giddiness: Secondary | ICD-10-CM | POA: Diagnosis not present

## 2023-02-13 DIAGNOSIS — R001 Bradycardia, unspecified: Secondary | ICD-10-CM | POA: Diagnosis not present

## 2023-02-13 DIAGNOSIS — F250 Schizoaffective disorder, bipolar type: Secondary | ICD-10-CM

## 2023-02-13 DIAGNOSIS — R1031 Right lower quadrant pain: Secondary | ICD-10-CM

## 2023-02-13 DIAGNOSIS — L309 Dermatitis, unspecified: Secondary | ICD-10-CM

## 2023-02-13 DIAGNOSIS — N50811 Right testicular pain: Secondary | ICD-10-CM

## 2023-02-13 DIAGNOSIS — B182 Chronic viral hepatitis C: Secondary | ICD-10-CM

## 2023-02-13 MED ORDER — TRIAMCINOLONE ACETONIDE 0.1 % EX CREA
1.0000 | TOPICAL_CREAM | Freq: Two times a day (BID) | CUTANEOUS | 0 refills | Status: DC
Start: 2023-02-13 — End: 2023-09-01

## 2023-02-13 NOTE — Patient Instructions (Signed)
To help with your dizziness, feeling off balance, I encourage you to check your blood pressure and pulse twice a day, keep a written log and have available for all office visits.  Make sure that you are staying well-hydrated.  To help with the rash on your elbows, you are going to use Kenalog cream twice daily until resolved.  We will call you with today's lab results.  Roney Jaffe, PA-C Physician Assistant [facility] [email or web address]   Dizziness Dizziness is a common problem. It makes you feel unsteady or light-headed. You may feel like you're about to faint. Dizziness can lead to getting hurt if you stumble or fall. It's more common to feel dizzy if you're an older adult. Many things can cause you to feel dizzy. These include: Medicines. Dehydration. This is when there's not enough water in your body. Illness. Follow these instructions at home: Eating and drinking  Drink enough fluid to keep your pee (urine) pale yellow. This helps keep you from getting dehydrated. Try to drink more clear fluids, such as water. Do not drink alcohol. Try to limit how much caffeine you take in. Try to limit how much salt, also called sodium, you take in. Activity Try not to make quick movements. Stand up slowly from sitting in a chair. Steady yourself until you feel okay. In the morning, first sit up on the side of the bed. When you feel okay, hold onto something and slowly stand up. Do this until you know that your balance is okay. If you need to stand in one place for a long time, move your legs often. Tighten and relax the muscles in your legs while you're standing. Do not drive or use machines if you feel dizzy. Avoid bending down if you feel dizzy. Place items in your home so you can reach them without leaning over. Lifestyle Do not smoke, vape, or use products with nicotine or tobacco in them. If you need help quitting, talk with  your health care provider. Try to lower your stress level. You can do this by using methods like yoga or meditation. Talk with your provider if you need help. General instructions Watch your dizziness for any changes. Take your medicines only as told by your provider. Talk with your provider if you think you're dizzy because of a medicine you're taking. Tell a friend or a family member that you're feeling dizzy. If they spot any changes in your behavior, have them call your provider. Contact a health care provider if: Your dizziness doesn't go away, or you have new symptoms. Your dizziness gets worse. You feel like you may vomit. You have trouble hearing. You have a fever. You have neck pain or a stiff neck. You fall or get hurt. Get help right away if: You vomit each time you eat or drink. You have watery poop and can't eat or drink. You have trouble talking, walking, swallowing, or using your arms, hands, or legs. You feel very weak. You're bleeding. You're not thinking clearly, or you have trouble forming sentences. A friend or family member may spot this. Your vision changes, or you get a very bad headache. These symptoms may be an emergency. Call 911 right away. Do not wait to see if the symptoms will go away. Do not drive yourself to the hospital. This information is not intended to replace advice given to you by your health care provider. Make sure you discuss any questions you have with your health care provider.  Document Revised: 08/25/2022 Document Reviewed: 08/25/2022 Elsevier Patient Education  2024 [employer].

## 2023-02-13 NOTE — Progress Notes (Unsigned)
Established Patient Office Visit  Subjective   Patient ID: Keith Gibbs, male    DOB: 1961/01/17  Age: 62 y.o. MRN: [id number]  Chief Complaint  Patient presents with   Fatigue   Shaking    States that he is currently being treated for substance abuse at [facility] residential treatment center.  States that he arrived approximately 1 week ago.  States that he has been experiencing tremors in both hands.  States that this has been ongoing for the past 10 to 15 years.  States that he has seen a neurologist who he states is sending him to see a Careers adviser specialist to consider neurosurgery.  States that he has not tried medication previously to help with the tremors.  States that he was told this was due to his blood pressure being so low.  Also complains of feeling "disoriented and off balance" states that this has been ongoing for the past month but worse past week since arriving at [facility].  States that he will have episodes of dizziness and his "walking and balance seems off.  States that he is staying well-hydrated, has not tried anything for relief.  States the feelings of dizziness do not last very long.    Also complains of continued right testicular discomfort.  Does endorse that he previously has been seen for this and did have a scrotal ultrasound completed.  Those results:  CLINICAL DATA:  Right testicular discomfort   EXAM: SCROTAL ULTRASOUND   DOPPLER ULTRASOUND OF THE TESTICLES   TECHNIQUE: Complete ultrasound examination of the testicles, epididymis, and other scrotal structures was performed. Color and spectral Doppler ultrasound were also utilized to evaluate blood flow to the testicles.   COMPARISON:  None Available.   FINDINGS: Right testicle   Measurements: 5.0 x 2.8 x 3.6 cm. No mass or microlithiasis visualized.   Left testicle   Measurements: 5.2 x 2.8 x 3.1 cm. No mass or microlithiasis visualized.   Right epididymis:  Normal in size and  appearance.   Left epididymis:  Normal in size and appearance.   Hydrocele:  None visualized.   Varicocele:  None visualized.   Pulsed Doppler interrogation of both testes demonstrates normal low resistance arterial and venous waveforms bilaterally.   IMPRESSION: No acute process.     Electronically Signed   By: Annia Belt M.D.   On: 11/12/2022 23:04    Also complains today of a itchy rash on both elbows.  States that this has been present since arriving at [facility].  States that he has not had any new medications, but has used new detergents, lotions, and body washes.  Has not tried anything for relief.  Past Medical History:  Diagnosis Date   Anginal pain (HCC)    Anxiety    Arthritis    Bipolar 1 disorder (HCC)    Bursitis    CAD (coronary artery disease)    Chronic pain    COPD (chronic obstructive pulmonary disease) (HCC)    Current use of long term anticoagulation    DAPT (ASA + clopidogrel)   Depression    Diverticulitis    Dyspnea    GERD (gastroesophageal reflux disease)    Grade I diastolic dysfunction    Hepatitis C 2012   No longer has Hep C   HLD (hyperlipidemia)    Hypertension    MI (myocardial infarction) (HCC)    Polysubstance abuse (HCC)    cocaine, marijuana, ETOH   PUD (peptic ulcer disease)  S/P angioplasty with stent 06/10/2016   a.) 90% stenosis of pLAD to mLAD - 2.5 x 18 mm Xience Alpine (DES x 1) placed to pLAD   S/P PTCA (percutaneous transluminal coronary angioplasty) 12/04/2019   a.) 60% in stent restenosis of DES to pLAD; LVEF 65%.   Schizophrenia (HCC)    Stroke [facility])    Valvular insufficiency    a.) Mild MR, TR, PR; mild to moderate AR on 03/05/2018 TTE   Social History   Socioeconomic History   Marital status: Widowed    Spouse name: Not on file   Number of children: Not on file   Years of education: Not on file   Highest education level: Not on file  Occupational History   Occupation: disability   Occupation:  stocking at gas station    Comment: 2 days per week  Tobacco Use   Smoking status: Every Day    Current packs/day: 0.00    Average packs/day: 0.5 packs/day for 32.0 years (16.0 ttl pk-yrs)    Types: Cigarettes    Start date: 10/25/1989    Last attempt to quit: 10/25/2021    Years since quitting: 1.3    Passive exposure: Current   Smokeless tobacco: Never   Tobacco comments:    Continues to smoke approximately 5 cigarettes per day.  Has been 2 days since las cigarette.  Has not bought any more.  Using patches and gum.  5/23 hfb  Vaping Use   Vaping status: Never Used  Substance and Sexual Activity   Alcohol use: Yes    Alcohol/week: 3.0 standard drinks of alcohol    Types: 3 Cans of beer per week    Comment: occassionally    Drug use: Yes    Types: Cocaine, Marijuana    Comment: last on saturday   Sexual activity: Yes    Partners: Female    Birth control/protection: None  Other Topics Concern   Not on file  Social History Narrative   Not on file   Social Determinants of Health   Financial Resource Strain: Not on file  Food Insecurity: No Food Insecurity (01/29/2023)   Hunger Vital Sign    Worried About Running Out of Food in the Last Year: Never true    Ran Out of Food in the Last Year: Never true  Transportation Needs: No Transportation Needs (01/29/2023)   PRAPARE - Administrator, Civil Service (Medical): No    Lack of Transportation (Non-Medical): No  Physical Activity: Not on file  Stress: Not on file  Social Connections: Not on file  Intimate Partner Violence: Not At Risk (01/29/2023)   Humiliation, Afraid, Rape, and Kick questionnaire    Fear of Current or Ex-Partner: No    Emotionally Abused: No    Physically Abused: No    Sexually Abused: No   Family History  Problem Relation Age of Onset   Osteoarthritis Mother    Heart disease Mother    Hypertension Mother    Depression Mother    Heart disease Father    Early death Father    Hypertension Father     Heart attack Father    Hypertension Sister    Parkinson's disease Maternal Grandfather    Prostate cancer Neg Hx    Bladder Cancer Neg Hx    Kidney cancer Neg Hx    Tremor Neg Hx    Allergies  Allergen Reactions   Asenapine Other (See Comments) and Nausea And Vomiting    Increased  tremors   Latuda [Lurasidone Hcl] Other (See Comments)    Tremors     Lurasidone Other (See Comments)    Review of Systems  Constitutional: Negative.   HENT: Negative.    Eyes: Negative.   Respiratory:  Negative for shortness of breath.   Cardiovascular:  Negative for chest pain.  Gastrointestinal: Negative.   Genitourinary: Negative.   Musculoskeletal: Negative.   Skin:  Positive for itching and rash.  Neurological:  Positive for dizziness and tremors. Negative for seizures and weakness.  Endo/Heme/Allergies: Negative.   Psychiatric/Behavioral: Negative.        Objective:     BP 102/62 (BP Location: Left Arm, Patient Position: Sitting, Cuff Size: Large)   Pulse (!) 49   Ht 6\' 3"  (1.905 m)   Wt 229 lb (103.9 kg)   SpO2 97%   BMI 28.62 kg/m  BP Readings from Last 3 Encounters:  02/13/23 102/62  02/03/23 104/61  01/28/23 114/65   Wt Readings from Last 3 Encounters:  02/13/23 229 lb (103.9 kg)  01/29/23 209 lb 3.2 oz (94.9 kg)  01/28/23 210 lb (95.3 kg)      Physical Exam Vitals and nursing note reviewed.  Constitutional:      Appearance: Normal appearance.  HENT:     Head: Normocephalic and atraumatic.     Right Ear: External ear normal.     Left Ear: External ear normal.     Nose: Nose normal.     Mouth/Throat:     Mouth: Mucous membranes are moist.     Pharynx: Oropharynx is clear.  Eyes:     Extraocular Movements: Extraocular movements intact.     Conjunctiva/sclera: Conjunctivae normal.     Pupils: Pupils are equal, round, and reactive to light.  Cardiovascular:     Rate and Rhythm: Regular rhythm. Bradycardia present.     Pulses: Normal pulses.     Heart  sounds: Normal heart sounds.  Pulmonary:     Effort: Pulmonary effort is normal.     Breath sounds: Normal breath sounds.  Musculoskeletal:        General: Normal range of motion.     Cervical back: Normal range of motion and neck supple.  Skin:    General: Skin is warm.     Findings: Rash present. Rash is vesicular.     Comments: Scattered vesicles in different stages of healing noted on both outer elbows.  Nonpustular.  Neurological:     General: No focal deficit present.     Motor: Tremor present.     Coordination: Coordination is intact.     Gait: Gait is intact.     Comments: Tremor noted both hands  Psychiatric:        Mood and Affect: Mood normal.        Behavior: Behavior normal.        Thought Content: Thought content normal.        Judgment: Judgment normal.         Assessment & Plan:   Problem List Items Addressed This Visit       Other   Schizoaffective disorder (HCC)   Right groin pain   Other Visit Diagnoses     Dizziness    -  Primary   Relevant Orders   Thyroid Panel With TSH (Completed)   Basic metabolic panel (Completed)   CBC with Differential/Platelet (Completed)   Dermatitis       Relevant Medications   triamcinolone cream (KENALOG) 0.1 %  Screening PSA (prostate specific antigen)       Relevant Orders   PSA (Completed)      1. Dizziness Patient education given on supportive care, encouraged to check blood pressure and pulse during episodes of dizziness.  Keep a written log and have available for all office visits.  Red flags given for prompt reevaluation  Patient to follow-up with mobile unit in 2 weeks. - Thyroid Panel With TSH - Basic metabolic panel - CBC with Differential/Platelet  2. Dermatitis Trial Kenalog cream.  Patient education given on supportive care - triamcinolone cream (KENALOG) 0.1 %; Apply 1 Application topically 2 (two) times daily.  Dispense: 30 g; Refill: 0  3. Pain in right testicle Reviewed ultrasound with  patient, reassurance given  4. Schizoaffective disorder, bipolar type (HCC) Managed by [facility] psychiatry  5. Screening PSA (prostate specific antigen)  - PSA   I have reviewed the patient's medical history (PMH, PSH, Social History, Family History, Medications, and allergies) , and have been updated if relevant. I spent 30 minutes reviewing chart and  face to face time with patient.    Return in about 2 weeks (around 02/27/2023) for With MMU.    Kasandra Knudsen Mayers, PA-C

## 2023-02-14 ENCOUNTER — Encounter: Payer: Self-pay | Admitting: Physician Assistant

## 2023-02-14 LAB — THYROID PANEL WITH TSH
Free Thyroxine Index: 1.8 (ref 1.2–4.9)
T3 Uptake Ratio: 27 % (ref 24–39)
T4, Total: 6.6 ug/dL (ref 4.5–12.0)
TSH: 1.97 u[IU]/mL (ref 0.450–4.500)

## 2023-02-14 LAB — CBC WITH DIFFERENTIAL/PLATELET
Basophils Absolute: 0 10*3/uL (ref 0.0–0.2)
Basos: 1 %
EOS (ABSOLUTE): 0.3 10*3/uL (ref 0.0–0.4)
Eos: 5 %
Hematocrit: 43.2 % (ref 37.5–51.0)
Hemoglobin: 14.1 g/dL (ref 13.0–17.7)
Immature Grans (Abs): 0 10*3/uL (ref 0.0–0.1)
Immature Granulocytes: 0 %
Lymphocytes Absolute: 2 10*3/uL (ref 0.7–3.1)
Lymphs: 31 %
MCH: 31.9 pg (ref 26.6–33.0)
MCHC: 32.6 g/dL (ref 31.5–35.7)
MCV: 98 fL — ABNORMAL HIGH (ref 79–97)
Monocytes Absolute: 0.6 10*3/uL (ref 0.1–0.9)
Monocytes: 9 %
Neutrophils Absolute: 3.6 10*3/uL (ref 1.4–7.0)
Neutrophils: 54 %
Platelets: 157 10*3/uL (ref 150–450)
RBC: 4.42 x10E6/uL (ref 4.14–5.80)
RDW: 13.7 % (ref 11.6–15.4)
WBC: 6.6 10*3/uL (ref 3.4–10.8)

## 2023-02-14 LAB — BASIC METABOLIC PANEL
BUN/Creatinine Ratio: 15 (ref 10–24)
BUN: 15 mg/dL (ref 8–27)
CO2: 25 mmol/L (ref 20–29)
Calcium: 9.2 mg/dL (ref 8.6–10.2)
Chloride: 101 mmol/L (ref 96–106)
Creatinine, Ser: 1.03 mg/dL (ref 0.76–1.27)
Glucose: 85 mg/dL (ref 70–99)
Potassium: 4.8 mmol/L (ref 3.5–5.2)
Sodium: 141 mmol/L (ref 134–144)
eGFR: 82 mL/min/{1.73_m2} (ref >59)

## 2023-02-14 LAB — PSA: Prostate Specific Ag, Serum: 0.6 ng/mL (ref 0.0–4.0)

## 2023-02-17 ENCOUNTER — Encounter (HOSPITAL_COMMUNITY): Payer: MEDICAID | Admitting: Student in an Organized Health Care Education/Training Program

## 2023-02-22 ENCOUNTER — Telehealth: Payer: Self-pay | Admitting: Family

## 2023-02-22 NOTE — Telephone Encounter (Signed)
Patient's mother left VM that patient needs to be seen sooner than his 8/8 appt. He is currently at [facility] in [place] and has been in the hospital some. They will have to arrange transportation from [facility] to get him here. Please call and offer sooner appt if available.

## 2023-02-28 ENCOUNTER — Ambulatory Visit (INDEPENDENT_AMBULATORY_CARE_PROVIDER_SITE_OTHER): Payer: MEDICAID | Admitting: Cardiovascular Disease

## 2023-02-28 ENCOUNTER — Encounter: Payer: Self-pay | Admitting: Cardiovascular Disease

## 2023-02-28 VITALS — BP 115/78 | HR 54 | Ht 75.0 in | Wt 238.0 lb

## 2023-02-28 DIAGNOSIS — I10 Essential (primary) hypertension: Secondary | ICD-10-CM | POA: Diagnosis not present

## 2023-02-28 DIAGNOSIS — Z9861 Coronary angioplasty status: Secondary | ICD-10-CM

## 2023-02-28 DIAGNOSIS — R0602 Shortness of breath: Secondary | ICD-10-CM

## 2023-02-28 DIAGNOSIS — I2510 Atherosclerotic heart disease of native coronary artery without angina pectoris: Secondary | ICD-10-CM

## 2023-02-28 DIAGNOSIS — F17210 Nicotine dependence, cigarettes, uncomplicated: Secondary | ICD-10-CM

## 2023-02-28 DIAGNOSIS — R0789 Other chest pain: Secondary | ICD-10-CM

## 2023-02-28 NOTE — Progress Notes (Signed)
Cardiology Office Note   Date:  02/28/2023   ID:  Keith Gibbs, DOB 1960/08/18, MRN [id number]  PCP:  Rema Fendt, NP  Cardiologist:  Adrian Blackwater, MD      History of Present Illness: Keith Gibbs is a 62 y.o. male who presents for  Chief Complaint  Patient presents with   Follow-up    3 mo & hospital f/u    Shortness of Breath This is a new problem. The current episode started 1 to 4 weeks ago. The problem has been resolved. Associated symptoms include chest pain and syncope.  Chest Pain  This is a new problem. The current episode started in the past 7 days. The onset quality is gradual. The problem has been gradually improving. Associated symptoms include palpitations, shortness of breath and syncope.  Loss of Consciousness This is a recurrent problem. The current episode started in the past 7 days. The problem has been gradually worsening. Associated symptoms include chest pain and palpitations.  Palpitations  This is a recurrent problem. The current episode started 1 to 4 weeks ago. The problem has been resolved. Associated symptoms include chest pain, shortness of breath and syncope.      Past Medical History:  Diagnosis Date   Anginal pain (HCC)    Anxiety    Arthritis    Bipolar 1 disorder (HCC)    Bursitis    CAD (coronary artery disease)    Chronic pain    COPD (chronic obstructive pulmonary disease) (HCC)    Current use of long term anticoagulation    DAPT (ASA + clopidogrel)   Depression    Diverticulitis    Dyspnea    GERD (gastroesophageal reflux disease)    Grade I diastolic dysfunction    Hepatitis C 2012   No longer has Hep C   HLD (hyperlipidemia)    Hypertension    MI (myocardial infarction) (HCC)    Polysubstance abuse (HCC)    cocaine, marijuana, ETOH   PUD (peptic ulcer disease)    S/P angioplasty with stent 06/10/2016   a.) 90% stenosis of pLAD to mLAD - 2.5 x 18 mm Xience Alpine (DES x 1) placed to pLAD   S/P PTCA  (percutaneous transluminal coronary angioplasty) 12/04/2019   a.) 60% in stent restenosis of DES to pLAD; LVEF 65%.   Schizophrenia (HCC)    Stroke [facility])    Valvular insufficiency    a.) Mild MR, TR, PR; mild to moderate AR on 03/05/2018 TTE     Past Surgical History:  Procedure Laterality Date   ABDOMINAL SURGERY     removed small piece of intestines due to [facility] Diverticulosis   ANTERIOR LAT LUMBAR FUSION N/A 08/23/2022   Procedure: DIRECT LATERAL INTERBODY FUSION  LUMBAR TWO- LUMBAR THREE, LUMBAR THREE-LUMBAR FOUR , EXPLORE AND EXTEND FUSION LUMBAR TWO-LUMBAR FIVE, POSTERIOR DECOMPRESSION LUMBAR THREE-LUMBAR FOUR, LEFT LUMBAR TWO-LUMBAR THREE;  Surgeon: Bedelia Person, MD;  Location: [facility] OR;  Service: Neurosurgery;  Laterality: N/A;   APPENDECTOMY     BACK SURGERY     CARDIAC CATHETERIZATION Left 06/10/2016   Procedure: Left Heart Cath and Coronary Angiography;  Surgeon: Laurier Nancy, MD;  Location: [facility] INVASIVE CV LAB;  Service: Cardiovascular;  Laterality: Left;   CARDIAC CATHETERIZATION N/A 06/10/2016   Procedure: Coronary Stent Intervention;  Surgeon: Alwyn Pea, MD;  Location: [facility] INVASIVE CV LAB;  Service: Cardiovascular;  Laterality: N/A;   CHOLECYSTECTOMY N/A 02/17/2022   Procedure: LAPAROSCOPIC CHOLECYSTECTOMY;  Surgeon:  Kinsinger, De Blanch, MD;  Location: [facility] OR;  Service: General;  Laterality: N/A;   COLON SURGERY     COLONOSCOPY     COLONOSCOPY WITH PROPOFOL N/A 01/05/2017   Procedure: COLONOSCOPY WITH PROPOFOL;  Surgeon: Wyline Mood, MD;  Location: [facility] ENDOSCOPY;  Service: Endoscopy;  Laterality: N/A;   COLONOSCOPY WITH PROPOFOL N/A 02/13/2020   Procedure: COLONOSCOPY WITH PROPOFOL;  Surgeon: Pasty Spillers, MD;  Location: [facility] ENDOSCOPY;  Service: Endoscopy;  Laterality: N/A;   CORONARY ANGIOPLASTY WITH STENT PLACEMENT     CORONARY PRESSURE/FFR STUDY N/A 12/04/2019   Procedure: INTRAVASCULAR PRESSURE WIRE/FFR STUDY;  Surgeon: Tonny Bollman, MD;   Location: [facility] INVASIVE CV LAB;  Service: Cardiovascular;  Laterality: N/A;   ESOPHAGOGASTRODUODENOSCOPY (EGD) WITH PROPOFOL N/A 01/05/2017   Procedure: ESOPHAGOGASTRODUODENOSCOPY (EGD) WITH PROPOFOL;  Surgeon: Wyline Mood, MD;  Location: [facility] ENDOSCOPY;  Service: Endoscopy;  Laterality: N/A;   ESOPHAGOGASTRODUODENOSCOPY (EGD) WITH PROPOFOL N/A 02/13/2020   Procedure: ESOPHAGOGASTRODUODENOSCOPY (EGD) WITH PROPOFOL;  Surgeon: Pasty Spillers, MD;  Location: [facility] ENDOSCOPY;  Service: Endoscopy;  Laterality: N/A;   KNEE ARTHROSCOPY WITH MEDIAL MENISECTOMY Right 09/05/2017   Procedure: KNEE ARTHROSCOPY WITH MEDIAL AND LATERAL  MENISECTOMY PARTIAL SYNOVECTOMY;  Surgeon: Kennedy Bucker, MD;  Location: [facility] ORS;  Service: Orthopedics;  Laterality: Right;   LEFT HEART CATH AND CORONARY ANGIOGRAPHY N/A 12/04/2019   Procedure: LEFT HEART CATH AND CORONARY ANGIOGRAPHY;  Surgeon: Tonny Bollman, MD;  Location: [facility] INVASIVE CV LAB;  Service: Cardiovascular;  Laterality: N/A;   LEFT HEART CATH AND CORONARY ANGIOGRAPHY Left 11/25/2022   Procedure: LEFT HEART CATH AND CORONARY ANGIOGRAPHY;  Surgeon: Laurier Nancy, MD;  Location: [facility] INVASIVE CV LAB;  Service: Cardiovascular;  Laterality: Left;   SHOULDER SURGERY Right 04/09/2012   SPINE SURGERY     TOTAL KNEE ARTHROPLASTY Right 01/19/2021   Procedure: TOTAL KNEE ARTHROPLASTY - [place] Neighbor to Assist;  Surgeon: Kennedy Bucker, MD;  Location: [facility] ORS;  Service: Orthopedics;  Laterality: Right;     Current Outpatient Medications  Medication Sig Dispense Refill   albuterol (VENTOLIN HFA) 108 (90 Base) MCG/ACT inhaler Inhale 2 puffs into the lungs every 6 (six) hours as needed for wheezing or shortness of breath. 18 g 6   aspirin EC 81 MG tablet Take 1 tablet (81 mg total) by mouth daily. Swallow whole. 30 tablet 0   atorvastatin (LIPITOR) 80 MG tablet Take 1 tablet (80 mg total) by mouth daily. 30 tablet 0   Budeson-Glycopyrrol-Formoterol (BREZTRI AEROSPHERE)  160-9-4.8 MCG/ACT AERO Inhale 2 puffs into the lungs in the morning and at bedtime. 1 each 0   cyclobenzaprine (FLEXERIL) 10 MG tablet Take 1 tablet (10 mg total) by mouth 3 (three) times daily as needed for muscle spasms. 50 tablet 0   diclofenac Sodium (VOLTAREN) 1 % GEL Apply 2 g topically 4 (four) times daily. 2 g 0   divalproex (DEPAKOTE) 500 MG DR tablet Take 1 tablet in the morning and 2 tablets at bedtime 90 tablet 0   DULoxetine (CYMBALTA) 30 MG capsule Take 3 capsules (90 mg total) by mouth daily. 30 capsule 0   gabapentin (NEURONTIN) 600 MG tablet Take 1 tablet (600 mg total) by mouth 2 (two) times daily. 90 tablet 0   hydrOXYzine (ATARAX) 25 MG tablet Take 1 tablet (25 mg total) by mouth 3 (three) times daily as needed for anxiety. 90 tablet 0   hydrOXYzine (ATARAX) 25 MG tablet Take 25 mg by mouth every 8 (eight) hours as needed for  anxiety.     Melatonin 10 MG TABS Take 5 mg by mouth as needed.     nicotine (NICODERM CQ - DOSED IN MG/24 HOURS) 14 mg/24hr patch Place 14 mg onto the skin as needed.     nitroGLYCERIN (NITROSTAT) 0.4 MG SL tablet Place 1 tablet (0.4 mg total) under the tongue every 5 (five) minutes as needed for chest pain. 30 tablet 0   pantoprazole (PROTONIX) 40 MG tablet Take 1 tablet (40 mg total) by mouth daily. 30 tablet 0   QUEtiapine (SEROQUEL) 100 MG tablet Take 1 tablet (100 mg total) by mouth at bedtime. 30 tablet 0   risperiDONE (RISPERDAL) 1 MG tablet Take 1 mg by mouth 2 (two) times daily.     traZODone (DESYREL) 100 MG tablet Take 1 tablet (100 mg total) by mouth at bedtime as needed for sleep. 30 tablet 0   triamcinolone cream (KENALOG) 0.1 % Apply 1 Application topically 2 (two) times daily. 30 g 0   No current facility-administered medications for this visit.    Allergies:   Asenapine, Latuda [lurasidone hcl], and Lurasidone    Social History:   reports that he has been smoking cigarettes. He started smoking about 33 years ago. He has a 16 pack-year  smoking history. He has been exposed to tobacco smoke. He has never used smokeless tobacco. He reports current alcohol use of about 3.0 standard drinks of alcohol per week. He reports current drug use. Drugs: Cocaine and Marijuana.   Family History:  family history includes Depression in his mother; Early death in his father; Heart attack in his father; Heart disease in his father and mother; Hypertension in his father, mother, and sister; Osteoarthritis in his mother; Parkinson's disease in his maternal grandfather.    ROS:     Review of Systems  Constitutional: Negative.   HENT: Negative.    Eyes: Negative.   Respiratory:  Positive for shortness of breath.   Cardiovascular:  Positive for chest pain, palpitations and syncope.  Gastrointestinal: Negative.   Genitourinary: Negative.   Musculoskeletal: Negative.   Skin: Negative.   Endo/Heme/Allergies: Negative.   Psychiatric/Behavioral: Negative.    All other systems reviewed and are negative.     All other systems are reviewed and negative.    PHYSICAL EXAM: VS:  BP 115/78   Pulse (!) 54   Ht 6\' 3"  (1.905 m)   Wt 238 lb (108 kg)   SpO2 96%   BMI 29.75 kg/m  , BMI Body mass index is 29.75 kg/m. Last weight:  Wt Readings from Last 3 Encounters:  02/28/23 238 lb (108 kg)  02/13/23 229 lb (103.9 kg)  01/28/23 210 lb (95.3 kg)     Physical Exam Vitals reviewed.  Constitutional:      Appearance: Normal appearance. He is normal weight.  HENT:     Head: Normocephalic.     Nose: Nose normal.     Mouth/Throat:     Mouth: Mucous membranes are moist.  Eyes:     Pupils: Pupils are equal, round, and reactive to light.  Cardiovascular:     Rate and Rhythm: Normal rate and regular rhythm.     Pulses: Normal pulses.     Heart sounds: Normal heart sounds.  Pulmonary:     Effort: Pulmonary effort is normal.  Abdominal:     General: Abdomen is flat. Bowel sounds are normal.  Musculoskeletal:        General: Normal range of  motion.  Cervical back: Normal range of motion.  Skin:    General: Skin is warm.  Neurological:     General: No focal deficit present.     Mental Status: He is alert.  Psychiatric:        Mood and Affect: Mood normal.       EKG:   Recent Labs: 01/28/2023: ALT 19; Magnesium 1.9 02/13/2023: BUN 15; Creatinine, Ser 1.03; Hemoglobin 14.1; Platelets 157; Potassium 4.8; Sodium 141; TSH 1.970    Lipid Panel    Component Value Date/Time   CHOL 83 04/26/2022 1530   CHOL 92 (L) 08/31/2021 1408   TRIG 52 04/26/2022 1530   HDL 28 (L) 04/26/2022 1530   HDL 33 (L) 08/31/2021 1408   CHOLHDL 3.0 04/26/2022 1530   VLDL 10 04/26/2022 1530   LDLCALC 45 04/26/2022 1530   LDLCALC 41 08/31/2021 1408      Other studies Reviewed: Additional studies/ records that were reviewed today include:  Review of the above records demonstrates:       No data to display            ASSESSMENT AND PLAN:    ICD-10-CM   1. CAD S/P percutaneous coronary angioplasty  I25.10 MYOCARDIAL PERFUSION IMAGING   Z98.61 PCV ECHOCARDIOGRAM COMPLETE    2. Essential hypertension  I10 MYOCARDIAL PERFUSION IMAGING    PCV ECHOCARDIOGRAM COMPLETE    3. SOB (shortness of breath)  R06.02 MYOCARDIAL PERFUSION IMAGING    PCV ECHOCARDIOGRAM COMPLETE    4. Other chest pain  R07.89 MYOCARDIAL PERFUSION IMAGING    PCV ECHOCARDIOGRAM COMPLETE   schedual echo, stress test as have recurrent chest pain, add meds       Problem List Items Addressed This Visit       Cardiovascular and Mediastinum   CAD S/P percutaneous coronary angioplasty - Primary (Chronic)   Relevant Orders   MYOCARDIAL PERFUSION IMAGING   PCV ECHOCARDIOGRAM COMPLETE   Essential hypertension   Relevant Orders   MYOCARDIAL PERFUSION IMAGING   PCV ECHOCARDIOGRAM COMPLETE     Other   Chest pain   Relevant Orders   MYOCARDIAL PERFUSION IMAGING   PCV ECHOCARDIOGRAM COMPLETE   Other Visit Diagnoses     SOB (shortness of breath)        Relevant Orders   MYOCARDIAL PERFUSION IMAGING   PCV ECHOCARDIOGRAM COMPLETE          Disposition:   Return in about 1 week (around 03/07/2023) for echo stress test and f/u.    Total time spent: 30 minutes  Signed,  Adrian Blackwater, MD  02/28/2023 10:58 AM    [facility]

## 2023-03-02 ENCOUNTER — Ambulatory Visit: Payer: MEDICAID | Admitting: Cardiovascular Disease

## 2023-03-08 ENCOUNTER — Ambulatory Visit (INDEPENDENT_AMBULATORY_CARE_PROVIDER_SITE_OTHER): Payer: MEDICAID

## 2023-03-08 DIAGNOSIS — R0789 Other chest pain: Secondary | ICD-10-CM | POA: Diagnosis not present

## 2023-03-08 DIAGNOSIS — Z9861 Coronary angioplasty status: Secondary | ICD-10-CM

## 2023-03-08 DIAGNOSIS — I10 Essential (primary) hypertension: Secondary | ICD-10-CM

## 2023-03-08 DIAGNOSIS — R0602 Shortness of breath: Secondary | ICD-10-CM | POA: Diagnosis not present

## 2023-03-08 DIAGNOSIS — I2510 Atherosclerotic heart disease of native coronary artery without angina pectoris: Secondary | ICD-10-CM | POA: Diagnosis not present

## 2023-03-08 MED ORDER — TECHNETIUM TC 99M SESTAMIBI GENERIC - CARDIOLITE
31.7000 | Freq: Once | INTRAVENOUS | Status: AC | PRN
  Administered 2023-03-08: 31.7 via INTRAVENOUS

## 2023-03-08 MED ORDER — TECHNETIUM TC 99M SESTAMIBI GENERIC - CARDIOLITE
11.0000 | Freq: Once | INTRAVENOUS | Status: AC | PRN
  Administered 2023-03-08: 11 via INTRAVENOUS

## 2023-03-13 ENCOUNTER — Ambulatory Visit (INDEPENDENT_AMBULATORY_CARE_PROVIDER_SITE_OTHER): Payer: MEDICAID

## 2023-03-13 DIAGNOSIS — I351 Nonrheumatic aortic (valve) insufficiency: Secondary | ICD-10-CM

## 2023-03-13 DIAGNOSIS — I10 Essential (primary) hypertension: Secondary | ICD-10-CM

## 2023-03-13 DIAGNOSIS — Z9861 Coronary angioplasty status: Secondary | ICD-10-CM

## 2023-03-13 DIAGNOSIS — I340 Nonrheumatic mitral (valve) insufficiency: Secondary | ICD-10-CM | POA: Diagnosis not present

## 2023-03-13 DIAGNOSIS — I361 Nonrheumatic tricuspid (valve) insufficiency: Secondary | ICD-10-CM

## 2023-03-13 DIAGNOSIS — R0789 Other chest pain: Secondary | ICD-10-CM

## 2023-03-13 DIAGNOSIS — R0602 Shortness of breath: Secondary | ICD-10-CM

## 2023-03-15 ENCOUNTER — Encounter: Payer: Self-pay | Admitting: Family

## 2023-03-15 ENCOUNTER — Ambulatory Visit (INDEPENDENT_AMBULATORY_CARE_PROVIDER_SITE_OTHER): Payer: MEDICAID | Admitting: Family

## 2023-03-15 VITALS — BP 107/63 | HR 57 | Temp 97.7°F | Ht 75.0 in | Wt 232.2 lb

## 2023-03-15 DIAGNOSIS — K429 Umbilical hernia without obstruction or gangrene: Secondary | ICD-10-CM | POA: Diagnosis not present

## 2023-03-15 NOTE — Progress Notes (Signed)
Pt states shoulder is feeling better now has hernia.  States hernia is at the bottom of his stomach and it hurts into his groin.

## 2023-03-15 NOTE — Progress Notes (Signed)
Patient ID: Keith Gibbs, male    DOB: 10/08/1960  MRN: [id number]  CC: Hernia  Subjective: Keith Gibbs is a 62 y.o. male who presents for hernia.  His concerns today include:  Umbilical hernia causing discomfort and radiating to right groin intermittently. He denies associated red flag symptoms. Reports he does heavy lifting of lawn equipment. No further issues/concerns for discussion today.   Patient Active Problem List   Diagnosis Date Noted   Major depressive disorder, recurrent severe without psychotic features (HCC) 01/28/2023   S/P lumbar fusion 08/23/2022   Postlaminectomy syndrome, not elsewhere classified 08/12/2022   MDD (major depressive disorder) 08/10/2022   Substance induced mood disorder (HCC) 08/06/2022   Suicide attempt by drug overdose (HCC) 08/06/2022   Toxic encephalopathy 08/03/2022   Suicide ideation 08/03/2022   Encephalopathy 08/03/2022   Polysubstance abuse (HCC) 04/28/2022   Postoperative abdominal pain 02/28/2022   Acute cholecystitis 02/17/2022   Obstruction of nasal valve 01/07/2022   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   Pulmonary nodule 12/17/2021   CAD S/P percutaneous coronary angioplasty 12/17/2021   Epigastric abdominal pain    Nausea and vomiting    Tobacco dependence due to cigarettes 08/31/2021   Iron deficiency anemia secondary to inadequate dietary iron intake 08/31/2021   Acute lead-induced gout involving toe of left foot 08/31/2021   Encounter for lipid screening for cardiovascular disease 08/31/2021   Hypotension due to drugs 06/21/2021   Opioid withdrawal (HCC) 06/21/2021   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HCC) 06/21/2021   RLS (restless legs syndrome) 05/07/2021   Acquired deviated nasal septum 05/03/2021   Encounter for surveillance of abnormal nevi 05/03/2021   Flu vaccine need 05/03/2021   Substance abuse (HCC) 05/03/2021   Drug abuse and dependence (HCC) 05/03/2021   Need for shingles vaccine 05/03/2021   Right groin pain 01/29/2021   S/P TKR  (total knee replacement) using cement, right 01/19/2021   Cigarette smoker 07/21/2020   Foot pain, bilateral 12/25/2019   Potential exposure to STD 12/12/2019   History of lumbar surgery 12/12/2019   Fatigue 12/12/2019   Penile lesion 12/12/2019   Right testicular pain 12/12/2019   Body mass index 28.0-28.9, adult 12/12/2019   History of COPD 12/12/2019   Tobacco use disorder, continuous 12/12/2019   Hepatoma (HCC) 11/05/2019   Anxiety 12/10/2018   Essential hypertension 09/18/2018   Hypercholesteremia 09/18/2018   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   Chronic anticoagulation (Plavix) 04/19/2018   Pain medication agreement broken ([facility]) 04/19/2018   Cocaine use 04/18/2018   Cocaine abuse (HCC) (See 04/12/18 UDS) 04/18/2018   Marijuana abuse (See 04/12/18 UDS) 04/18/2018   Long term current use of opiate analgesic 04/12/2018   Cirrhosis of liver (HCC) 04/03/2018   BPH (benign prostatic hyperplasia) 04/03/2018   Hematuria, microscopic 04/03/2018   Osteoarthritis of the knee (Left) 11/02/2017   Lumbar facet syndrome (Bilateral) 11/01/2017   Spondylosis without myelopathy or radiculopathy, lumbosacral region 11/01/2017   Osteoarthritis of shoulder (Right) 11/01/2017   Osteoarthritis of acromioclavicular joint (Right) 11/01/2017   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) 11/01/2017   Osteoarthritis 11/01/2017   Cervicalgia 11/01/2017   Cervical facet syndrome 11/01/2017   DDD (degenerative disc disease), cervical 11/01/2017   DDD (degenerative disc disease), lumbar 11/01/2017   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) 09/18/2017   Other chronic pain 09/18/2017   Chronic musculoskeletal pain 09/18/2017   Chronic pain of right knee 09/11/2017   Chronic low back pain (Secondary Area of Pain) (Bilateral) 09/11/2017   Chronic hip pain (  Tertiary Area of Pain) (Bilateral) (R>L) 09/11/2017   Chronic shoulder pain (Fourth Area of Pain) (Right) 09/11/2017   Spondylosis  without myelopathy or radiculopathy, cervical region 09/11/2017   Chronic pain syndrome 09/11/2017   Opiate use 09/11/2017   Screen for STD (sexually transmitted disease) 09/11/2017   Disorder of skeletal system 09/11/2017   Problems influencing health status 09/11/2017   Overdose of medication, intentional self-harm, sequela (HCC) 12/28/2016   Severe recurrent major depression without psychotic features (HCC) 12/27/2016   Alcohol use disorder, moderate, dependence (HCC) 12/27/2016   Cannabis use disorder, moderate, dependence (HCC) 12/27/2016   Chest pain 06/10/2016   Bipolar 1 disorder (HCC) 05/27/2013   Myalgia 05/27/2013   Abnormal ejaculation 11/30/2012   Chest pain, unspecified 11/30/2012   Degenerative arthritis of hip 11/16/2012   Schizoaffective disorder (HCC) 06/27/2012   COPD (chronic obstructive pulmonary disease) (HCC) 06/27/2012   Chronic hepatitis C (HCC) 06/27/2012   GERD (gastroesophageal reflux disease) 06/27/2012   Complete rupture of rotator cuff 02/10/2012   Cocaine abuse in remission (HCC) 12/05/2011   Unspecified osteoarthritis, unspecified site 07/27/2011     Current Outpatient Medications on File Prior to Visit  Medication Sig Dispense Refill   albuterol (VENTOLIN HFA) 108 (90 Base) MCG/ACT inhaler Inhale 2 puffs into the lungs every 6 (six) hours as needed for wheezing or shortness of breath. 18 g 6   aspirin EC 81 MG tablet Take 1 tablet (81 mg total) by mouth daily. Swallow whole. 30 tablet 0   atorvastatin (LIPITOR) 80 MG tablet Take 1 tablet (80 mg total) by mouth daily. 30 tablet 0   cyclobenzaprine (FLEXERIL) 10 MG tablet Take 1 tablet (10 mg total) by mouth 3 (three) times daily as needed for muscle spasms. 50 tablet 0   diclofenac Sodium (VOLTAREN) 1 % GEL Apply 2 g topically 4 (four) times daily. 2 g 0   divalproex (DEPAKOTE) 500 MG DR tablet Take 1 tablet in the morning and 2 tablets at bedtime 90 tablet 0   DULoxetine (CYMBALTA) 30 MG capsule Take  3 capsules (90 mg total) by mouth daily. 30 capsule 0   gabapentin (NEURONTIN) 600 MG tablet Take 1 tablet (600 mg total) by mouth 2 (two) times daily. 90 tablet 0   hydrOXYzine (ATARAX) 25 MG tablet Take 1 tablet (25 mg total) by mouth 3 (three) times daily as needed for anxiety. 90 tablet 0   hydrOXYzine (ATARAX) 25 MG tablet Take 25 mg by mouth every 8 (eight) hours as needed for anxiety.     Melatonin 10 MG TABS Take 5 mg by mouth as needed.     nicotine (NICODERM CQ - DOSED IN MG/24 HOURS) 14 mg/24hr patch Place 14 mg onto the skin as needed.     nitroGLYCERIN (NITROSTAT) 0.4 MG SL tablet Place 1 tablet (0.4 mg total) under the tongue every 5 (five) minutes as needed for chest pain. 30 tablet 0   pantoprazole (PROTONIX) 40 MG tablet Take 1 tablet (40 mg total) by mouth daily. 30 tablet 0   QUEtiapine (SEROQUEL) 100 MG tablet Take 1 tablet (100 mg total) by mouth at bedtime. 30 tablet 0   risperiDONE (RISPERDAL) 1 MG tablet Take 1 mg by mouth 2 (two) times daily.     traZODone (DESYREL) 100 MG tablet Take 1 tablet (100 mg total) by mouth at bedtime as needed for sleep. 30 tablet 0   triamcinolone cream (KENALOG) 0.1 % Apply 1 Application topically 2 (two) times daily. 30 g 0  Budeson-Glycopyrrol-Formoterol (BREZTRI AEROSPHERE) 160-9-4.8 MCG/ACT AERO Inhale 2 puffs into the lungs in the morning and at bedtime. (Patient not taking: Reported on 03/15/2023) 1 each 0   No current facility-administered medications on file prior to visit.    Allergies  Allergen Reactions   Asenapine Other (See Comments) and Nausea And Vomiting    Increased tremors   Latuda [Lurasidone Hcl] Other (See Comments)    Tremors     Lurasidone Other (See Comments)    Social History   Socioeconomic History   Marital status: Widowed    Spouse name: Not on file   Number of children: Not on file   Years of education: Not on file   Highest education level: Not on file  Occupational History   Occupation:  disability   Occupation: stocking at gas station    Comment: 2 days per week  Tobacco Use   Smoking status: Every Day    Current packs/day: 0.00    Average packs/day: 0.5 packs/day for 32.0 years (16.0 ttl pk-yrs)    Types: Cigarettes    Start date: 10/25/1989    Last attempt to quit: 10/25/2021    Years since quitting: 1.3    Passive exposure: Current   Smokeless tobacco: Never   Tobacco comments:    Continues to smoke approximately 5 cigarettes per day.  Has been 2 days since las cigarette.  Has not bought any more.  Using patches and gum.  5/23 hfb  Vaping Use   Vaping status: Never Used  Substance and Sexual Activity   Alcohol use: Yes    Alcohol/week: 3.0 standard drinks of alcohol    Types: 3 Cans of beer per week    Comment: occassionally    Drug use: Yes    Types: Cocaine, Marijuana    Comment: last on saturday   Sexual activity: Yes    Partners: Female    Birth control/protection: None  Other Topics Concern   Not on file  Social History Narrative   Not on file   Social Determinants of Health   Financial Resource Strain: Not on file  Food Insecurity: No Food Insecurity (01/29/2023)   Hunger Vital Sign    Worried About Running Out of Food in the Last Year: Never true    Ran Out of Food in the Last Year: Never true  Transportation Needs: No Transportation Needs (01/29/2023)   PRAPARE - Administrator, Civil Service (Medical): No    Lack of Transportation (Non-Medical): No  Physical Activity: Not on file  Stress: Not on file  Social Connections: Not on file  Intimate Partner Violence: Not At Risk (01/29/2023)   Humiliation, Afraid, Rape, and Kick questionnaire    Fear of Current or Ex-Partner: No    Emotionally Abused: No    Physically Abused: No    Sexually Abused: No    Family History  Problem Relation Age of Onset   Osteoarthritis Mother    Heart disease Mother    Hypertension Mother    Depression Mother    Heart disease Father    Early death  Father    Hypertension Father    Heart attack Father    Hypertension Sister    Parkinson's disease Maternal Grandfather    Prostate cancer Neg Hx    Bladder Cancer Neg Hx    Kidney cancer Neg Hx    Tremor Neg Hx     Past Surgical History:  Procedure Laterality Date   ABDOMINAL SURGERY  removed small piece of intestines due to Meckles Diverticulosis   ANTERIOR LAT LUMBAR FUSION N/A 08/23/2022   Procedure: DIRECT LATERAL INTERBODY FUSION  LUMBAR TWO- LUMBAR THREE, LUMBAR THREE-LUMBAR FOUR , EXPLORE AND EXTEND FUSION LUMBAR TWO-LUMBAR FIVE, POSTERIOR DECOMPRESSION LUMBAR THREE-LUMBAR FOUR, LEFT LUMBAR TWO-LUMBAR THREE;  Surgeon: Bedelia Person, MD;  Location: [facility] OR;  Service: Neurosurgery;  Laterality: N/A;   APPENDECTOMY     BACK SURGERY     CARDIAC CATHETERIZATION Left 06/10/2016   Procedure: Left Heart Cath and Coronary Angiography;  Surgeon: Laurier Nancy, MD;  Location: [facility] INVASIVE CV LAB;  Service: Cardiovascular;  Laterality: Left;   CARDIAC CATHETERIZATION N/A 06/10/2016   Procedure: Coronary Stent Intervention;  Surgeon: Alwyn Pea, MD;  Location: [facility] INVASIVE CV LAB;  Service: Cardiovascular;  Laterality: N/A;   CHOLECYSTECTOMY N/A 02/17/2022   Procedure: LAPAROSCOPIC CHOLECYSTECTOMY;  Surgeon: Sheliah Hatch De Blanch, MD;  Location: [facility] OR;  Service: General;  Laterality: N/A;   COLON SURGERY     COLONOSCOPY     COLONOSCOPY WITH PROPOFOL N/A 01/05/2017   Procedure: COLONOSCOPY WITH PROPOFOL;  Surgeon: Wyline Mood, MD;  Location: [facility] ENDOSCOPY;  Service: Endoscopy;  Laterality: N/A;   COLONOSCOPY WITH PROPOFOL N/A 02/13/2020   Procedure: COLONOSCOPY WITH PROPOFOL;  Surgeon: Pasty Spillers, MD;  Location: [facility] ENDOSCOPY;  Service: Endoscopy;  Laterality: N/A;   CORONARY ANGIOPLASTY WITH STENT PLACEMENT     CORONARY PRESSURE/FFR STUDY N/A 12/04/2019   Procedure: INTRAVASCULAR PRESSURE WIRE/FFR STUDY;  Surgeon: Tonny Bollman, MD;  Location: [facility] INVASIVE CV  LAB;  Service: Cardiovascular;  Laterality: N/A;   ESOPHAGOGASTRODUODENOSCOPY (EGD) WITH PROPOFOL N/A 01/05/2017   Procedure: ESOPHAGOGASTRODUODENOSCOPY (EGD) WITH PROPOFOL;  Surgeon: Wyline Mood, MD;  Location: [facility] ENDOSCOPY;  Service: Endoscopy;  Laterality: N/A;   ESOPHAGOGASTRODUODENOSCOPY (EGD) WITH PROPOFOL N/A 02/13/2020   Procedure: ESOPHAGOGASTRODUODENOSCOPY (EGD) WITH PROPOFOL;  Surgeon: Pasty Spillers, MD;  Location: [facility] ENDOSCOPY;  Service: Endoscopy;  Laterality: N/A;   KNEE ARTHROSCOPY WITH MEDIAL MENISECTOMY Right 09/05/2017   Procedure: KNEE ARTHROSCOPY WITH MEDIAL AND LATERAL  MENISECTOMY PARTIAL SYNOVECTOMY;  Surgeon: Kennedy Bucker, MD;  Location: [facility] ORS;  Service: Orthopedics;  Laterality: Right;   LEFT HEART CATH AND CORONARY ANGIOGRAPHY N/A 12/04/2019   Procedure: LEFT HEART CATH AND CORONARY ANGIOGRAPHY;  Surgeon: Tonny Bollman, MD;  Location: [facility] INVASIVE CV LAB;  Service: Cardiovascular;  Laterality: N/A;   LEFT HEART CATH AND CORONARY ANGIOGRAPHY Left 11/25/2022   Procedure: LEFT HEART CATH AND CORONARY ANGIOGRAPHY;  Surgeon: Laurier Nancy, MD;  Location: [facility] INVASIVE CV LAB;  Service: Cardiovascular;  Laterality: Left;   SHOULDER SURGERY Right 04/09/2012   SPINE SURGERY     TOTAL KNEE ARTHROPLASTY Right 01/19/2021   Procedure: TOTAL KNEE ARTHROPLASTY - [place] Neighbor to Assist;  Surgeon: Kennedy Bucker, MD;  Location: [facility] ORS;  Service: Orthopedics;  Laterality: Right;    ROS: Review of Systems Negative except as stated above  PHYSICAL EXAM: BP 107/63   Pulse (!) 57   Temp 97.7 F (36.5 C) (Oral)   Ht 6\' 3"  (1.905 m)   Wt 232 lb 3.2 oz (105.3 kg)   SpO2 95%   BMI 29.02 kg/m   Physical Exam HENT:     Head: Normocephalic and atraumatic.     Nose: Nose normal.     Mouth/Throat:     Mouth: Mucous membranes are moist.     Pharynx: Oropharynx is clear.  Eyes:     Extraocular Movements: Extraocular movements intact.  Conjunctiva/sclera:  Conjunctivae normal.     Pupils: Pupils are equal, round, and reactive to light.  Cardiovascular:     Rate and Rhythm: Tachycardia present.     Pulses: Normal pulses.     Heart sounds: Normal heart sounds.  Pulmonary:     Effort: Pulmonary effort is normal.     Breath sounds: Normal breath sounds.  Abdominal:     General: Bowel sounds are normal.     Palpations: Abdomen is soft.     Hernia: A hernia is present.  Genitourinary:    Comments: Groin appears normal bilaterally. Keith Gibbs, CMA present.  Musculoskeletal:        General: Normal range of motion.     Cervical back: Normal range of motion and neck supple.  Neurological:     General: No focal deficit present.     Mental Status: He is alert and oriented to person, place, and time.  Psychiatric:        Mood and Affect: Mood normal.        Behavior: Behavior normal.      ASSESSMENT AND PLAN: 1. Umbilical hernia without obstruction and without gangrene - Referral to General Surgery for further evaluation/management.  - Ambulatory referral to General Surgery    Patient was given the opportunity to ask questions.  Patient verbalized understanding of the plan and was able to repeat key elements of the plan. Patient was given clear instructions to go to Emergency Department or return to medical center if symptoms don't improve, worsen, or new problems develop.The patient verbalized understanding.   Orders Placed This Encounter  Procedures   Ambulatory referral to General Surgery   Follow-up with primary provider as scheduled.   Rema Fendt, NP

## 2023-03-17 ENCOUNTER — Ambulatory Visit (INDEPENDENT_AMBULATORY_CARE_PROVIDER_SITE_OTHER): Payer: MEDICAID | Admitting: Cardiovascular Disease

## 2023-03-17 ENCOUNTER — Encounter: Payer: Self-pay | Admitting: Cardiovascular Disease

## 2023-03-17 VITALS — BP 139/78 | HR 60 | Ht 75.0 in | Wt 236.0 lb

## 2023-03-17 DIAGNOSIS — I340 Nonrheumatic mitral (valve) insufficiency: Secondary | ICD-10-CM | POA: Diagnosis not present

## 2023-03-17 DIAGNOSIS — R0602 Shortness of breath: Secondary | ICD-10-CM

## 2023-03-17 DIAGNOSIS — I10 Essential (primary) hypertension: Secondary | ICD-10-CM

## 2023-03-17 DIAGNOSIS — Z9861 Coronary angioplasty status: Secondary | ICD-10-CM

## 2023-03-17 DIAGNOSIS — I2510 Atherosclerotic heart disease of native coronary artery without angina pectoris: Secondary | ICD-10-CM | POA: Diagnosis not present

## 2023-03-17 DIAGNOSIS — I351 Nonrheumatic aortic (valve) insufficiency: Secondary | ICD-10-CM

## 2023-03-17 NOTE — Progress Notes (Signed)
Cardiology Office Note   Date:  03/17/2023   ID:  Keith Gibbs, DOB Jun 10, 1961, MRN [id number]  PCP:  Rema Fendt, NP  Cardiologist:  Adrian Blackwater, MD      History of Present Illness: Keith Gibbs is a 62 y.o. male who presents for  Chief Complaint  Patient presents with   Results    Feel good today      Past Medical History:  Diagnosis Date   Anginal pain (HCC)    Anxiety    Arthritis    Bipolar 1 disorder (HCC)    Bursitis    CAD (coronary artery disease)    Chronic pain    COPD (chronic obstructive pulmonary disease) (HCC)    Current use of long term anticoagulation    DAPT (ASA + clopidogrel)   Depression    Diverticulitis    Dyspnea    GERD (gastroesophageal reflux disease)    Grade I diastolic dysfunction    Hepatitis C 2012   No longer has Hep C   HLD (hyperlipidemia)    Hypertension    MI (myocardial infarction) (HCC)    Polysubstance abuse (HCC)    cocaine, marijuana, ETOH   PUD (peptic ulcer disease)    S/P angioplasty with stent 06/10/2016   a.) 90% stenosis of pLAD to mLAD - 2.5 x 18 mm Xience Alpine (DES x 1) placed to pLAD   S/P PTCA (percutaneous transluminal coronary angioplasty) 12/04/2019   a.) 60% in stent restenosis of DES to pLAD; LVEF 65%.   Schizophrenia (HCC)    Stroke [facility])    Valvular insufficiency    a.) Mild MR, TR, PR; mild to moderate AR on 03/05/2018 TTE     Past Surgical History:  Procedure Laterality Date   ABDOMINAL SURGERY     removed small piece of intestines due to [facility] Diverticulosis   ANTERIOR LAT LUMBAR FUSION N/A 08/23/2022   Procedure: DIRECT LATERAL INTERBODY FUSION  LUMBAR TWO- LUMBAR THREE, LUMBAR THREE-LUMBAR FOUR , EXPLORE AND EXTEND FUSION LUMBAR TWO-LUMBAR FIVE, POSTERIOR DECOMPRESSION LUMBAR THREE-LUMBAR FOUR, LEFT LUMBAR TWO-LUMBAR THREE;  Surgeon: Bedelia Person, MD;  Location: [facility] OR;  Service: Neurosurgery;  Laterality: N/A;   APPENDECTOMY     BACK SURGERY     CARDIAC  CATHETERIZATION Left 06/10/2016   Procedure: Left Heart Cath and Coronary Angiography;  Surgeon: Laurier Nancy, MD;  Location: [facility] INVASIVE CV LAB;  Service: Cardiovascular;  Laterality: Left;   CARDIAC CATHETERIZATION N/A 06/10/2016   Procedure: Coronary Stent Intervention;  Surgeon: Alwyn Pea, MD;  Location: [facility] INVASIVE CV LAB;  Service: Cardiovascular;  Laterality: N/A;   CHOLECYSTECTOMY N/A 02/17/2022   Procedure: LAPAROSCOPIC CHOLECYSTECTOMY;  Surgeon: Sheliah Hatch De Blanch, MD;  Location: [facility] OR;  Service: General;  Laterality: N/A;   COLON SURGERY     COLONOSCOPY     COLONOSCOPY WITH PROPOFOL N/A 01/05/2017   Procedure: COLONOSCOPY WITH PROPOFOL;  Surgeon: Wyline Mood, MD;  Location: [facility] ENDOSCOPY;  Service: Endoscopy;  Laterality: N/A;   COLONOSCOPY WITH PROPOFOL N/A 02/13/2020   Procedure: COLONOSCOPY WITH PROPOFOL;  Surgeon: Pasty Spillers, MD;  Location: [facility] ENDOSCOPY;  Service: Endoscopy;  Laterality: N/A;   CORONARY ANGIOPLASTY WITH STENT PLACEMENT     CORONARY PRESSURE/FFR STUDY N/A 12/04/2019   Procedure: INTRAVASCULAR PRESSURE WIRE/FFR STUDY;  Surgeon: Tonny Bollman, MD;  Location: [facility] INVASIVE CV LAB;  Service: Cardiovascular;  Laterality: N/A;   ESOPHAGOGASTRODUODENOSCOPY (EGD) WITH PROPOFOL N/A 01/05/2017   Procedure: ESOPHAGOGASTRODUODENOSCOPY (EGD)  WITH PROPOFOL;  Surgeon: Wyline Mood, MD;  Location: [facility] ENDOSCOPY;  Service: Endoscopy;  Laterality: N/A;   ESOPHAGOGASTRODUODENOSCOPY (EGD) WITH PROPOFOL N/A 02/13/2020   Procedure: ESOPHAGOGASTRODUODENOSCOPY (EGD) WITH PROPOFOL;  Surgeon: Pasty Spillers, MD;  Location: [facility] ENDOSCOPY;  Service: Endoscopy;  Laterality: N/A;   KNEE ARTHROSCOPY WITH MEDIAL MENISECTOMY Right 09/05/2017   Procedure: KNEE ARTHROSCOPY WITH MEDIAL AND LATERAL  MENISECTOMY PARTIAL SYNOVECTOMY;  Surgeon: Kennedy Bucker, MD;  Location: [facility] ORS;  Service: Orthopedics;  Laterality: Right;   LEFT HEART CATH AND CORONARY ANGIOGRAPHY  N/A 12/04/2019   Procedure: LEFT HEART CATH AND CORONARY ANGIOGRAPHY;  Surgeon: Tonny Bollman, MD;  Location: [facility] INVASIVE CV LAB;  Service: Cardiovascular;  Laterality: N/A;   LEFT HEART CATH AND CORONARY ANGIOGRAPHY Left 11/25/2022   Procedure: LEFT HEART CATH AND CORONARY ANGIOGRAPHY;  Surgeon: Laurier Nancy, MD;  Location: [facility] INVASIVE CV LAB;  Service: Cardiovascular;  Laterality: Left;   SHOULDER SURGERY Right 04/09/2012   SPINE SURGERY     TOTAL KNEE ARTHROPLASTY Right 01/19/2021   Procedure: TOTAL KNEE ARTHROPLASTY - [place] Neighbor to Assist;  Surgeon: Kennedy Bucker, MD;  Location: [facility] ORS;  Service: Orthopedics;  Laterality: Right;     Current Outpatient Medications  Medication Sig Dispense Refill   albuterol (VENTOLIN HFA) 108 (90 Base) MCG/ACT inhaler Inhale 2 puffs into the lungs every 6 (six) hours as needed for wheezing or shortness of breath. 18 g 6   aspirin EC 81 MG tablet Take 1 tablet (81 mg total) by mouth daily. Swallow whole. 30 tablet 0   atorvastatin (LIPITOR) 80 MG tablet Take 1 tablet (80 mg total) by mouth daily. 30 tablet 0   Budeson-Glycopyrrol-Formoterol (BREZTRI AEROSPHERE) 160-9-4.8 MCG/ACT AERO Inhale 2 puffs into the lungs in the morning and at bedtime. (Patient not taking: Reported on 03/15/2023) 1 each 0   cyclobenzaprine (FLEXERIL) 10 MG tablet Take 1 tablet (10 mg total) by mouth 3 (three) times daily as needed for muscle spasms. 50 tablet 0   diclofenac Sodium (VOLTAREN) 1 % GEL Apply 2 g topically 4 (four) times daily. 2 g 0   divalproex (DEPAKOTE) 500 MG DR tablet Take 1 tablet in the morning and 2 tablets at bedtime 90 tablet 0   DULoxetine (CYMBALTA) 30 MG capsule Take 3 capsules (90 mg total) by mouth daily. 30 capsule 0   gabapentin (NEURONTIN) 600 MG tablet Take 1 tablet (600 mg total) by mouth 2 (two) times daily. 90 tablet 0   hydrOXYzine (ATARAX) 25 MG tablet Take 1 tablet (25 mg total) by mouth 3 (three) times daily as needed for anxiety. 90  tablet 0   hydrOXYzine (ATARAX) 25 MG tablet Take 25 mg by mouth every 8 (eight) hours as needed for anxiety.     Melatonin 10 MG TABS Take 5 mg by mouth as needed.     nicotine (NICODERM CQ - DOSED IN MG/24 HOURS) 14 mg/24hr patch Place 14 mg onto the skin as needed.     nitroGLYCERIN (NITROSTAT) 0.4 MG SL tablet Place 1 tablet (0.4 mg total) under the tongue every 5 (five) minutes as needed for chest pain. 30 tablet 0   pantoprazole (PROTONIX) 40 MG tablet Take 1 tablet (40 mg total) by mouth daily. 30 tablet 0   QUEtiapine (SEROQUEL) 100 MG tablet Take 1 tablet (100 mg total) by mouth at bedtime. 30 tablet 0   risperiDONE (RISPERDAL) 1 MG tablet Take 1 mg by mouth 2 (two) times daily.  traZODone (DESYREL) 100 MG tablet Take 1 tablet (100 mg total) by mouth at bedtime as needed for sleep. 30 tablet 0   triamcinolone cream (KENALOG) 0.1 % Apply 1 Application topically 2 (two) times daily. 30 g 0   No current facility-administered medications for this visit.    Allergies:   Asenapine, Latuda [lurasidone hcl], and Lurasidone    Social History:   reports that he has been smoking cigarettes. He started smoking about 33 years ago. He has a 16 pack-year smoking history. He has been exposed to tobacco smoke. He has never used smokeless tobacco. He reports current alcohol use of about 3.0 standard drinks of alcohol per week. He reports current drug use. Drugs: Cocaine and Marijuana.   Family History:  family history includes Depression in his mother; Early death in his father; Heart attack in his father; Heart disease in his father and mother; Hypertension in his father, mother, and sister; Osteoarthritis in his mother; Parkinson's disease in his maternal grandfather.    ROS:     Review of Systems  Constitutional: Negative.   HENT: Negative.    Eyes: Negative.   Respiratory: Negative.    Gastrointestinal: Negative.   Genitourinary: Negative.   Musculoskeletal: Negative.   Skin: Negative.    Neurological: Negative.   Endo/Heme/Allergies: Negative.   Psychiatric/Behavioral: Negative.    All other systems reviewed and are negative.     All other systems are reviewed and negative.    PHYSICAL EXAM: VS:  BP 139/78   Pulse 60   Ht 6\' 3"  (1.905 m)   Wt 236 lb (107 kg)   SpO2 97%   BMI 29.50 kg/m  , BMI Body mass index is 29.5 kg/m. Last weight:  Wt Readings from Last 3 Encounters:  03/17/23 236 lb (107 kg)  03/15/23 232 lb 3.2 oz (105.3 kg)  02/28/23 238 lb (108 kg)     Physical Exam Vitals reviewed.  Constitutional:      Appearance: Normal appearance. He is normal weight.  HENT:     Head: Normocephalic.     Nose: Nose normal.     Mouth/Throat:     Mouth: Mucous membranes are moist.  Eyes:     Pupils: Pupils are equal, round, and reactive to light.  Cardiovascular:     Rate and Rhythm: Normal rate and regular rhythm.     Pulses: Normal pulses.     Heart sounds: Normal heart sounds.  Pulmonary:     Effort: Pulmonary effort is normal.  Abdominal:     General: Abdomen is flat. Bowel sounds are normal.  Musculoskeletal:        General: Normal range of motion.     Cervical back: Normal range of motion.  Skin:    General: Skin is warm.  Neurological:     General: No focal deficit present.     Mental Status: He is alert.  Psychiatric:        Mood and Affect: Mood normal.       EKG:   Recent Labs: 01/28/2023: ALT 19; Magnesium 1.9 02/13/2023: BUN 15; Creatinine, Ser 1.03; Hemoglobin 14.1; Platelets 157; Potassium 4.8; Sodium 141; TSH 1.970    Lipid Panel    Component Value Date/Time   CHOL 83 04/26/2022 1530   CHOL 92 (L) 08/31/2021 1408   TRIG 52 04/26/2022 1530   HDL 28 (L) 04/26/2022 1530   HDL 33 (L) 08/31/2021 1408   CHOLHDL 3.0 04/26/2022 1530   VLDL 10 04/26/2022 1530  LDLCALC 45 04/26/2022 1530   LDLCALC 41 08/31/2021 1408      Other studies Reviewed: Additional studies/ records that were reviewed today include:  Review of the  above records demonstrates:       No data to display            ASSESSMENT AND PLAN:    ICD-10-CM   1. CAD S/P percutaneous coronary angioplasty  I25.10    Z98.61    stress test abnormal stress test but no chest pain    2. Essential hypertension  I10     3. SOB (shortness of breath)  R06.02     4. Primary hypertension  I10     5. Nonrheumatic mitral valve regurgitation  I34.0     6. Nonrheumatic aortic valve insufficiency  I35.1        Problem List Items Addressed This Visit       Cardiovascular and Mediastinum   CAD S/P percutaneous coronary angioplasty - Primary (Chronic)   Essential hypertension   Other Visit Diagnoses     SOB (shortness of breath)       Primary hypertension       Nonrheumatic mitral valve regurgitation       Nonrheumatic aortic valve insufficiency              Disposition:   Return in about 3 months (around 06/17/2023).    Total time spent: 35 minutes  Signed,  Adrian Blackwater, MD  03/17/2023 10:46 AM    [facility]

## 2023-03-21 ENCOUNTER — Encounter: Payer: MEDICAID | Admitting: Physical Medicine and Rehabilitation

## 2023-03-26 ENCOUNTER — Emergency Department (HOSPITAL_COMMUNITY): Payer: MEDICAID

## 2023-03-26 ENCOUNTER — Other Ambulatory Visit: Payer: Self-pay

## 2023-03-26 ENCOUNTER — Emergency Department (HOSPITAL_COMMUNITY)
Admission: EM | Admit: 2023-03-26 | Discharge: 2023-03-26 | Disposition: A | Discharge status: Home / Self Care | Payer: MEDICAID | Attending: Emergency Medicine | Admitting: Emergency Medicine

## 2023-03-26 DIAGNOSIS — S80211A Abrasion, right knee, initial encounter: Secondary | ICD-10-CM | POA: Diagnosis not present

## 2023-03-26 DIAGNOSIS — I10 Essential (primary) hypertension: Secondary | ICD-10-CM | POA: Insufficient documentation

## 2023-03-26 DIAGNOSIS — J449 Chronic obstructive pulmonary disease, unspecified: Secondary | ICD-10-CM | POA: Insufficient documentation

## 2023-03-26 DIAGNOSIS — S60512A Abrasion of left hand, initial encounter: Secondary | ICD-10-CM | POA: Insufficient documentation

## 2023-03-26 DIAGNOSIS — I2510 Atherosclerotic heart disease of native coronary artery without angina pectoris: Secondary | ICD-10-CM | POA: Insufficient documentation

## 2023-03-26 DIAGNOSIS — Z7982 Long term (current) use of aspirin: Secondary | ICD-10-CM | POA: Diagnosis not present

## 2023-03-26 DIAGNOSIS — Z7951 Long term (current) use of inhaled steroids: Secondary | ICD-10-CM | POA: Insufficient documentation

## 2023-03-26 DIAGNOSIS — Y9355 Activity, bike riding: Secondary | ICD-10-CM | POA: Diagnosis not present

## 2023-03-26 DIAGNOSIS — S3992XA Unspecified injury of lower back, initial encounter: Secondary | ICD-10-CM | POA: Diagnosis not present

## 2023-03-26 DIAGNOSIS — S50811A Abrasion of right forearm, initial encounter: Secondary | ICD-10-CM | POA: Diagnosis not present

## 2023-03-26 DIAGNOSIS — S60511A Abrasion of right hand, initial encounter: Secondary | ICD-10-CM | POA: Insufficient documentation

## 2023-03-26 DIAGNOSIS — S6991XA Unspecified injury of right wrist, hand and finger(s), initial encounter: Secondary | ICD-10-CM | POA: Diagnosis present

## 2023-03-26 DIAGNOSIS — Z23 Encounter for immunization: Secondary | ICD-10-CM | POA: Diagnosis not present

## 2023-03-26 MED ORDER — TETANUS-DIPHTH-ACELL PERTUSSIS 5-2.5-18.5 LF-MCG/0.5 IM SUSY
0.5000 mL | PREFILLED_SYRINGE | Freq: Once | INTRAMUSCULAR | Status: AC
  Administered 2023-03-26: 0.5 mL via INTRAMUSCULAR
  Filled 2023-03-26: qty 0.5
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
## 2023-04-03 ENCOUNTER — Other Ambulatory Visit (HOSPITAL_COMMUNITY): Payer: Self-pay | Admitting: Psychiatry

## 2023-04-07 ENCOUNTER — Telehealth (HOSPITAL_COMMUNITY): Payer: MEDICAID | Admitting: Student in an Organized Health Care Education/Training Program

## 2023-04-07 ENCOUNTER — Encounter (HOSPITAL_COMMUNITY): Payer: Self-pay | Admitting: Student in an Organized Health Care Education/Training Program

## 2023-04-07 DIAGNOSIS — F250 Schizoaffective disorder, bipolar type: Secondary | ICD-10-CM

## 2023-04-07 DIAGNOSIS — F1011 Alcohol abuse, in remission: Secondary | ICD-10-CM

## 2023-04-07 DIAGNOSIS — F4310 Post-traumatic stress disorder, unspecified: Secondary | ICD-10-CM

## 2023-04-07 DIAGNOSIS — F1111 Opioid abuse, in remission: Secondary | ICD-10-CM

## 2023-04-07 DIAGNOSIS — F1411 Cocaine abuse, in remission: Secondary | ICD-10-CM

## 2023-04-07 MED ORDER — DULOXETINE HCL 30 MG PO CPEP
90.0000 mg | ORAL_CAPSULE | Freq: Every day | ORAL | 1 refills | Status: DC
Start: 2023-04-07 — End: 2023-05-16

## 2023-04-07 MED ORDER — HYDROXYZINE HCL 25 MG PO TABS: 25.0000 mg | ORAL_TABLET | Freq: Three times a day (TID) | ORAL | 1 refills | Status: DC | PRN

## 2023-04-07 MED ORDER — DIVALPROEX SODIUM 500 MG PO DR TAB
DELAYED_RELEASE_TABLET | ORAL | 1 refills | Status: DC
Start: 2023-04-07 — End: 2023-05-16

## 2023-04-07 MED ORDER — GABAPENTIN 600 MG PO TABS
600.0000 mg | ORAL_TABLET | Freq: Three times a day (TID) | ORAL | 1 refills | Status: DC
Start: 2023-04-07 — End: 2023-05-16

## 2023-04-07 MED ORDER — TRAZODONE HCL 100 MG PO TABS
100.0000 mg | ORAL_TABLET | Freq: Every evening | ORAL | 1 refills | Status: DC | PRN
Start: 2023-04-07 — End: 2023-05-16

## 2023-04-07 MED ORDER — RISPERIDONE 1 MG PO TABS
1.0000 mg | ORAL_TABLET | Freq: Every day | ORAL | 0 refills | Status: DC
Start: 2023-04-07 — End: 2023-05-16

## 2023-04-07 MED ORDER — QUETIAPINE FUMARATE 100 MG PO TABS: 100.0000 mg | ORAL_TABLET | Freq: Every day | ORAL | 1 refills | Status: DC

## 2023-04-07 NOTE — Addendum Note (Signed)
Addended by: Eliseo Gum B on: 04/07/2023 02:16 PM   Modules accepted: Level of Service
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
## 2023-04-12 ENCOUNTER — Other Ambulatory Visit (INDEPENDENT_AMBULATORY_CARE_PROVIDER_SITE_OTHER): Payer: MEDICAID

## 2023-04-12 ENCOUNTER — Ambulatory Visit (INDEPENDENT_AMBULATORY_CARE_PROVIDER_SITE_OTHER): Payer: MEDICAID | Admitting: Surgical

## 2023-04-12 ENCOUNTER — Encounter: Payer: Self-pay | Admitting: Surgical

## 2023-04-12 DIAGNOSIS — M25511 Pain in right shoulder: Secondary | ICD-10-CM

## 2023-04-12 MED ORDER — TRAMADOL HCL 50 MG PO TABS: 50.0000 mg | ORAL_TABLET | Freq: Every evening | ORAL | 0 refills | Status: DC | PRN
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
## 2023-04-13 ENCOUNTER — Ambulatory Visit: Payer: MEDICAID | Admitting: Orthopedic Surgery

## 2023-05-05 ENCOUNTER — Telehealth (HOSPITAL_COMMUNITY): Payer: Self-pay | Admitting: *Deleted

## 2023-05-05 NOTE — Telephone Encounter (Signed)
Looked online with cover my meds for prior authorization of Quetiapine 100mg . Approved unitl 05/02/24. Called to notify pharmacy.

## 2023-05-16 ENCOUNTER — Other Ambulatory Visit (HOSPITAL_COMMUNITY): Payer: Self-pay | Admitting: Psychiatry

## 2023-05-16 ENCOUNTER — Ambulatory Visit: Payer: MEDICAID | Admitting: Physical Medicine and Rehabilitation

## 2023-05-16 ENCOUNTER — Telehealth (HOSPITAL_COMMUNITY): Payer: Self-pay

## 2023-05-16 ENCOUNTER — Other Ambulatory Visit: Payer: Self-pay

## 2023-05-16 DIAGNOSIS — M5412 Radiculopathy, cervical region: Secondary | ICD-10-CM | POA: Diagnosis not present

## 2023-05-16 DIAGNOSIS — F1111 Opioid abuse, in remission: Secondary | ICD-10-CM

## 2023-05-16 DIAGNOSIS — F1011 Alcohol abuse, in remission: Secondary | ICD-10-CM

## 2023-05-16 DIAGNOSIS — F1411 Cocaine abuse, in remission: Secondary | ICD-10-CM

## 2023-05-16 DIAGNOSIS — F250 Schizoaffective disorder, bipolar type: Secondary | ICD-10-CM

## 2023-05-16 DIAGNOSIS — F4310 Post-traumatic stress disorder, unspecified: Secondary | ICD-10-CM

## 2023-05-16 MED ORDER — QUETIAPINE FUMARATE 100 MG PO TABS: 100.0000 mg | ORAL_TABLET | Freq: Every day | ORAL | 0 refills | Status: DC

## 2023-05-16 MED ORDER — TRAZODONE HCL 100 MG PO TABS: 100.0000 mg | ORAL_TABLET | Freq: Every evening | ORAL | 0 refills | Status: DC | PRN

## 2023-05-16 MED ORDER — GABAPENTIN 600 MG PO TABS: 600.0000 mg | ORAL_TABLET | Freq: Three times a day (TID) | ORAL | 0 refills | Status: DC

## 2023-05-16 MED ORDER — DIVALPROEX SODIUM 500 MG PO DR TAB
DELAYED_RELEASE_TABLET | ORAL | 0 refills | Status: DC
Start: 2023-05-16 — End: 2023-05-26

## 2023-05-16 MED ORDER — RISPERIDONE 1 MG PO TABS: 1.0000 mg | ORAL_TABLET | Freq: Every day | ORAL | 0 refills | Status: DC

## 2023-05-16 MED ORDER — DULOXETINE HCL 30 MG PO CPEP: 90.0000 mg | ORAL_CAPSULE | Freq: Every day | ORAL | 0 refills | Status: DC

## 2023-05-16 MED ORDER — METHYLPREDNISOLONE ACETATE 40 MG/ML IJ SUSP
40.0000 mg | Freq: Once | INTRAMUSCULAR | Status: AC
  Administered 2023-05-16: 40 mg

## 2023-05-16 MED ORDER — HYDROXYZINE HCL 25 MG PO TABS
25.0000 mg | ORAL_TABLET | Freq: Three times a day (TID) | ORAL | 0 refills | Status: DC | PRN
Start: 2023-05-16 — End: 2023-05-26

## 2023-05-16 NOTE — Patient Instructions (Signed)

## 2023-05-16 NOTE — Telephone Encounter (Signed)
Message received from patients mother, they are unable to get the medication because you are not an approved medicaid provider. Please resend the medication under your supervising doctor, thank you

## 2023-05-16 NOTE — Progress Notes (Signed)
Functional Pain Scale - descriptive words and definitions  Intense (8)    Cannot complete any ADLs without much assistance/cannot concentrate/conversation is difficult/unable to sleep and unable to use distraction. Severe range order  Average Pain 9 110/72 Pain running into arms - constant  +Driver, -BT, -Dye Allergies.

## 2023-05-18 ENCOUNTER — Other Ambulatory Visit (HOSPITAL_COMMUNITY): Payer: Self-pay | Admitting: Psychiatry

## 2023-05-18 DIAGNOSIS — F1111 Opioid abuse, in remission: Secondary | ICD-10-CM

## 2023-05-18 DIAGNOSIS — F4310 Post-traumatic stress disorder, unspecified: Secondary | ICD-10-CM

## 2023-05-18 DIAGNOSIS — F250 Schizoaffective disorder, bipolar type: Secondary | ICD-10-CM

## 2023-05-18 MED ORDER — GABAPENTIN 600 MG PO TABS: ORAL_TABLET | ORAL | 0 refills | Status: DC

## 2023-05-24 ENCOUNTER — Other Ambulatory Visit: Payer: Self-pay | Admitting: Surgical

## 2023-05-24 ENCOUNTER — Telehealth: Payer: Self-pay | Admitting: Orthopedic Surgery

## 2023-05-24 MED ORDER — TRAMADOL HCL 50 MG PO TABS: 50.0000 mg | ORAL_TABLET | Freq: Two times a day (BID) | ORAL | 0 refills | Status: DC | PRN

## 2023-05-24 NOTE — Telephone Encounter (Signed)
Pt called in stating his shoulder pain is getting worse where he cannot sleep through the night and would like something for pain Pt MRI is not scheduled until the 11/18

## 2023-05-24 NOTE — Procedures (Signed)
Cervical Epidural Steroid Injection - Interlaminar Approach with Fluoroscopic Guidance  Patient: Keith Gibbs      Date of Birth: 10-28-1960 MRN: [id number] PCP: Rema Fendt, NP      Visit Date: 05/16/2023   Universal Protocol:    Date/Time: 10/30/247:24 AM  Consent Given By: the patient  Position: PRONE  Additional Comments: Vital signs were monitored before and after the procedure. Patient was prepped and draped in the usual sterile fashion. The correct patient, procedure, and site was verified.   Injection Procedure Details:   Procedure diagnoses: Cervical radiculopathy [M54.12]    Meds Administered:  Meds ordered this encounter  Medications   methylPREDNISolone acetate (DEPO-MEDROL) injection 40 mg     Laterality: Right  Location/Site: C7-T1  Needle: 3.5 in., 20 ga. Tuohy  Needle Placement: Paramedian epidural space  Findings:  -Comments: Excellent flow of contrast into the epidural space.  Procedure Details: Using a paramedian approach from the side mentioned above, the region overlying the inferior lamina was localized under fluoroscopic visualization and the soft tissues overlying this structure were infiltrated with 4 ml. of 1% Lidocaine without Epinephrine. A # 20 gauge, Tuohy needle was inserted into the epidural space using a paramedian approach.  The epidural space was localized using loss of resistance along with contralateral oblique bi-planar fluoroscopic views.  After negative aspirate for air, blood, and CSF, a 2 ml. volume of Isovue-250 was injected into the epidural space and the flow of contrast was observed. Radiographs were obtained for documentation purposes.   The injectate was administered into the level noted above.  Additional Comments:  No complications occurred Dressing: 2 x 2 sterile gauze and Band-Aid    Post-procedure details: Patient was observed during the procedure. Post-procedure instructions were reviewed.  Patient  left the clinic in stable condition.

## 2023-05-24 NOTE — Progress Notes (Signed)
Keith Gibbs - 62 y.o. male MRN [id number]  Date of birth: 1961/06/30  Office Visit Note: Visit Date: 05/16/2023 PCP: Rema Fendt, NP Referred by: Rema Fendt, NP  Subjective: Chief Complaint  Patient presents with   Neck - Pain   HPI:  Keith Gibbs is a 62 y.o. male who comes in today at the request of Karenann Cai, PA-C for planned Right C7-T1 Cervical Interlaminar epidural steroid injection with fluoroscopic guidance.  The patient has failed conservative care including home exercise, medications, time and activity modification.  This injection will be diagnostic and hopefully therapeutic.  Please see requesting physician notes for further details and justification.   ROS Otherwise per HPI.  Assessment & Plan: Visit Diagnoses:    ICD-10-CM   1. Cervical radiculopathy  M54.12 XR C-ARM NO REPORT    Epidural Steroid injection    methylPREDNISolone acetate (DEPO-MEDROL) injection 40 mg      Plan: No additional findings.   Meds & Orders:  Meds ordered this encounter  Medications   methylPREDNISolone acetate (DEPO-MEDROL) injection 40 mg    Orders Placed This Encounter  Procedures   XR C-ARM NO REPORT   Epidural Steroid injection    Follow-up: Return for visit to requesting provider as needed.   Procedures: No procedures performed  Cervical Epidural Steroid Injection - Interlaminar Approach with Fluoroscopic Guidance  Patient: Keith Gibbs      Date of Birth: Feb 01, 1961 MRN: [id number] PCP: Rema Fendt, NP      Visit Date: 05/16/2023   Universal Protocol:    Date/Time: 10/30/247:24 AM  Consent Given By: the patient  Position: PRONE  Additional Comments: Vital signs were monitored before and after the procedure. Patient was prepped and draped in the usual sterile fashion. The correct patient, procedure, and site was verified.   Injection Procedure Details:   Procedure diagnoses: Cervical radiculopathy [M54.12]    Meds Administered:   Meds ordered this encounter  Medications   methylPREDNISolone acetate (DEPO-MEDROL) injection 40 mg     Laterality: Right  Location/Site: C7-T1  Needle: 3.5 in., 20 ga. Tuohy  Needle Placement: Paramedian epidural space  Findings:  -Comments: Excellent flow of contrast into the epidural space.  Procedure Details: Using a paramedian approach from the side mentioned above, the region overlying the inferior lamina was localized under fluoroscopic visualization and the soft tissues overlying this structure were infiltrated with 4 ml. of 1% Lidocaine without Epinephrine. A # 20 gauge, Tuohy needle was inserted into the epidural space using a paramedian approach.  The epidural space was localized using loss of resistance along with contralateral oblique bi-planar fluoroscopic views.  After negative aspirate for air, blood, and CSF, a 2 ml. volume of Isovue-250 was injected into the epidural space and the flow of contrast was observed. Radiographs were obtained for documentation purposes.   The injectate was administered into the level noted above.  Additional Comments:  No complications occurred Dressing: 2 x 2 sterile gauze and Band-Aid    Post-procedure details: Patient was observed during the procedure. Post-procedure instructions were reviewed.  Patient left the clinic in stable condition.   Clinical History: CLINICAL DATA:  Neck trauma, dangerous injury mechanism   EXAM: MRI CERVICAL SPINE WITHOUT CONTRAST   TECHNIQUE: Multiplanar, multisequence MR imaging of the cervical spine was performed. No intravenous contrast was administered.   COMPARISON:  09/14/2021 CT cervical spine, no prior MRI of the cervical spine.   FINDINGS: Alignment: No significant antero or retrolisthesis.  Vertebrae: No acute fracture or suspicious osseous lesion. Endplate degenerative changes most prominently at C5-C6.   Cord: Normal signal and morphology. No evidence of significant  canal hematoma. The hyperdensity noted on the prior CT correlates with T2 hypointense material along the right aspect of the thecal sac at C7-T1 (series 8, image 24), which is favored to represent ossification of the ligamentum flavum or hypertrophy of the facets, but could possibly represent a small amount of hemorrhage; this does not cause significant mass effect on the thecal sac or spinal canal stenosis.   Posterior Fossa, vertebral arteries, paraspinal tissues: Negative.   Disc levels:   C2-C3: No significant disc bulge. Left-greater-than-right facet and uncovertebral hypertrophy. No spinal canal stenosis or neuroforaminal narrowing.   C3-C4: Mild disc bulge. Left-greater-than-right facet and uncovertebral hypertrophy. No spinal canal stenosis. Mild left neural foraminal narrowing.   C4-C5: No significant disc bulge. No spinal canal stenosis or neural foraminal narrowing.   C5-C6: Disc height loss and mild disc bulge. Facet and uncovertebral hypertrophy. No spinal canal stenosis. Moderate to severe right and moderate left neural foraminal narrowing.   C6-C7: No significant disc bulge. Facet and uncovertebral hypertrophy. No spinal canal stenosis. Mild bilateral neural foraminal narrowing.   C7-T1: No significant disc bulge. Facet and uncovertebral hypertrophy. No spinal canal stenosis. No neural foraminal narrowing. A small synovial cyst is seen extending from the medial aspect of the left facets, which also does not cause spinal canal stenosis.   IMPRESSION: 1. The hyperdensity noted on the cervical spine CT at C7-T1 correlates with a T2 hypointense focus, which is nonspecific and may represent ossification of the ligamentum flavum, sequela of facet arthropathy, or a small amount of hemorrhage. This does not cause significant spinal canal stenosis or mass effect on the thecal sac. 2. No spinal canal stenosis. Multilevel uncovertebral and facet arthropathy causes up  to moderate to severe neural foraminal narrowing at C5-C6 on the right.     Electronically Signed   By: Wiliam Ke M.D.   On: 09/14/2021 23:10     Objective:  VS:  HT:    WT:   BMI:     BP:   HR: bpm  TEMP: ( )  RESP:  Physical Exam Vitals and nursing note reviewed.  Constitutional:      General: He is not in acute distress.    Appearance: Normal appearance. He is not ill-appearing.  HENT:     Head: Normocephalic and atraumatic.     Right Ear: External ear normal.     Left Ear: External ear normal.  Eyes:     Extraocular Movements: Extraocular movements intact.  Cardiovascular:     Rate and Rhythm: Normal rate.     Pulses: Normal pulses.  Abdominal:     General: There is no distension.     Palpations: Abdomen is soft.  Musculoskeletal:        General: No signs of injury.     Cervical back: Neck supple. Tenderness present. No rigidity.     Right lower leg: No edema.     Left lower leg: No edema.     Comments: Patient has good strength in the upper extremities with 5 out of 5 strength in wrist extension long finger flexion APB.  No intrinsic hand muscle atrophy.  Negative Hoffmann's test.  Lymphadenopathy:     Cervical: No cervical adenopathy.  Skin:    Findings: No erythema or rash.  Neurological:     General: No focal deficit present.  Mental Status: He is alert and oriented to person, place, and time.     Sensory: No sensory deficit.     Motor: No weakness or abnormal muscle tone.     Coordination: Coordination normal.  Psychiatric:        Mood and Affect: Mood normal.        Behavior: Behavior normal.      Imaging: No results found.

## 2023-05-24 NOTE — Telephone Encounter (Signed)
I sent in a refill for tramadol to take every 12 hours as needed.  I am okay with this temporarily until we get the MRI scan.

## 2023-05-25 NOTE — Telephone Encounter (Signed)
Pt was advised.

## 2023-05-26 ENCOUNTER — Ambulatory Visit (INDEPENDENT_AMBULATORY_CARE_PROVIDER_SITE_OTHER): Payer: MEDICAID | Admitting: Student in an Organized Health Care Education/Training Program

## 2023-05-26 ENCOUNTER — Encounter (HOSPITAL_COMMUNITY): Payer: Self-pay | Admitting: Student in an Organized Health Care Education/Training Program

## 2023-05-26 VITALS — BP 106/65 | HR 63 | Wt 234.0 lb

## 2023-05-26 DIAGNOSIS — F1011 Alcohol abuse, in remission: Secondary | ICD-10-CM | POA: Diagnosis not present

## 2023-05-26 DIAGNOSIS — F1411 Cocaine abuse, in remission: Secondary | ICD-10-CM | POA: Diagnosis not present

## 2023-05-26 DIAGNOSIS — F250 Schizoaffective disorder, bipolar type: Secondary | ICD-10-CM

## 2023-05-26 DIAGNOSIS — F1111 Opioid abuse, in remission: Secondary | ICD-10-CM

## 2023-05-26 DIAGNOSIS — F4310 Post-traumatic stress disorder, unspecified: Secondary | ICD-10-CM

## 2023-05-26 DIAGNOSIS — Z79899 Other long term (current) drug therapy: Secondary | ICD-10-CM

## 2023-05-26 MED ORDER — DULOXETINE HCL 30 MG PO CPEP: 90.0000 mg | ORAL_CAPSULE | Freq: Every day | ORAL | 1 refills | Status: DC

## 2023-05-26 MED ORDER — DIVALPROEX SODIUM 500 MG PO DR TAB: DELAYED_RELEASE_TABLET | ORAL | 1 refills | Status: DC

## 2023-05-26 MED ORDER — TRAZODONE HCL 100 MG PO TABS: 100.0000 mg | ORAL_TABLET | Freq: Every evening | ORAL | 1 refills | Status: DC | PRN

## 2023-05-26 MED ORDER — HYDROXYZINE HCL 25 MG PO TABS: 25.0000 mg | ORAL_TABLET | Freq: Two times a day (BID) | ORAL | 1 refills | Status: DC | PRN

## 2023-05-26 MED ORDER — RISPERIDONE 0.5 MG PO TABS: 0.5000 mg | ORAL_TABLET | Freq: Every day | ORAL | 0 refills | Status: DC

## 2023-05-26 MED ORDER — QUETIAPINE FUMARATE 100 MG PO TABS: 100.0000 mg | ORAL_TABLET | Freq: Every day | ORAL | 1 refills | Status: DC

## 2023-05-26 MED ORDER — GABAPENTIN 600 MG PO TABS: ORAL_TABLET | ORAL | 0 refills | Status: DC
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
## 2023-05-30 ENCOUNTER — Emergency Department (HOSPITAL_COMMUNITY): Payer: MEDICAID

## 2023-05-30 ENCOUNTER — Encounter (HOSPITAL_COMMUNITY): Payer: Self-pay

## 2023-05-30 ENCOUNTER — Other Ambulatory Visit: Payer: Self-pay

## 2023-05-30 ENCOUNTER — Ambulatory Visit
Admission: EM | Admit: 2023-05-30 | Discharge: 2023-05-30 | Disposition: A | Discharge status: Other Acute Inpatient Hospital | Payer: MEDICAID

## 2023-05-30 ENCOUNTER — Telehealth (HOSPITAL_COMMUNITY): Payer: Self-pay | Admitting: *Deleted

## 2023-05-30 ENCOUNTER — Emergency Department (HOSPITAL_COMMUNITY)
Admission: EM | Admit: 2023-05-30 | Discharge: 2023-05-30 | Disposition: A | Discharge status: Home / Self Care | Payer: MEDICAID | Attending: Emergency Medicine | Admitting: Emergency Medicine

## 2023-05-30 DIAGNOSIS — R0781 Pleurodynia: Secondary | ICD-10-CM | POA: Diagnosis present

## 2023-05-30 DIAGNOSIS — D72829 Elevated white blood cell count, unspecified: Secondary | ICD-10-CM | POA: Diagnosis not present

## 2023-05-30 DIAGNOSIS — I10 Essential (primary) hypertension: Secondary | ICD-10-CM | POA: Diagnosis not present

## 2023-05-30 DIAGNOSIS — F17210 Nicotine dependence, cigarettes, uncomplicated: Secondary | ICD-10-CM | POA: Diagnosis not present

## 2023-05-30 DIAGNOSIS — Z1152 Encounter for screening for COVID-19: Secondary | ICD-10-CM | POA: Diagnosis not present

## 2023-05-30 DIAGNOSIS — U071 COVID-19: Secondary | ICD-10-CM

## 2023-05-30 DIAGNOSIS — Z79899 Other long term (current) drug therapy: Secondary | ICD-10-CM | POA: Insufficient documentation

## 2023-05-30 DIAGNOSIS — I2510 Atherosclerotic heart disease of native coronary artery without angina pectoris: Secondary | ICD-10-CM | POA: Insufficient documentation

## 2023-05-30 DIAGNOSIS — R0602 Shortness of breath: Secondary | ICD-10-CM

## 2023-05-30 DIAGNOSIS — Z7982 Long term (current) use of aspirin: Secondary | ICD-10-CM | POA: Insufficient documentation

## 2023-05-30 DIAGNOSIS — J449 Chronic obstructive pulmonary disease, unspecified: Secondary | ICD-10-CM | POA: Diagnosis not present

## 2023-05-30 DIAGNOSIS — Z8673 Personal history of transient ischemic attack (TIA), and cerebral infarction without residual deficits: Secondary | ICD-10-CM | POA: Insufficient documentation

## 2023-05-30 DIAGNOSIS — M5416 Radiculopathy, lumbar region: Secondary | ICD-10-CM | POA: Insufficient documentation

## 2023-05-30 LAB — I-STAT VENOUS BLOOD GAS, ED
Acid-Base Excess: 1 mmol/L (ref 0.0–2.0)
Bicarbonate: 26.4 mmol/L (ref 20.0–28.0)
Calcium, Ion: 1.15 mmol/L (ref 1.15–1.40)
HCT: 44 % (ref 39.0–52.0)
Hemoglobin: 15 g/dL (ref 13.0–17.0)
O2 Saturation: 48 %
Potassium: 5.1 mmol/L (ref 3.5–5.1)
Sodium: 134 mmol/L — ABNORMAL LOW (ref 135–145)
TCO2: 28 mmol/L (ref 22–32)
pCO2, Ven: 45 mm[Hg] (ref 44–60)
pH, Ven: 7.376 (ref 7.25–7.43)
pO2, Ven: 27 mm[Hg] — CL (ref 32–45)

## 2023-05-30 LAB — CBC
HCT: 43.1 % (ref 39.0–52.0)
Hemoglobin: 14.6 g/dL (ref 13.0–17.0)
MCH: 31.9 pg (ref 26.0–34.0)
MCHC: 33.9 g/dL (ref 30.0–36.0)
MCV: 94.1 fL (ref 80.0–100.0)
Platelets: 222 10*3/uL (ref 150–400)
RBC: 4.58 MIL/uL (ref 4.22–5.81)
RDW: 12.8 % (ref 11.5–15.5)
WBC: 10.9 10*3/uL — ABNORMAL HIGH (ref 4.0–10.5)
nRBC: 0 % (ref 0.0–0.2)

## 2023-05-30 LAB — RESP PANEL BY RT-PCR (RSV, FLU A&B, COVID)  RVPGX2
Influenza A by PCR: NEGATIVE
Influenza B by PCR: NEGATIVE
Resp Syncytial Virus by PCR: NEGATIVE
SARS Coronavirus 2 by RT PCR: NEGATIVE

## 2023-05-30 LAB — I-STAT CG4 LACTIC ACID, ED
Lactic Acid, Venous: 1.1 mmol/L (ref 0.5–1.9)
Lactic Acid, Venous: 0.6 mmol/L (ref 0.5–1.9)

## 2023-05-30 LAB — BRAIN NATRIURETIC PEPTIDE: B Natriuretic Peptide: 34.3 pg/mL (ref 0.0–100.0)

## 2023-05-30 LAB — COMPREHENSIVE METABOLIC PANEL
ALT: 13 U/L (ref 0–44)
AST: 18 U/L (ref 15–41)
Albumin: 3.3 g/dL — ABNORMAL LOW (ref 3.5–5.0)
Alkaline Phosphatase: 49 U/L (ref 38–126)
Anion gap: 11 (ref 5–15)
BUN: 12 mg/dL (ref 8–23)
CO2: 21 mmol/L — ABNORMAL LOW (ref 22–32)
Calcium: 9.1 mg/dL (ref 8.9–10.3)
Chloride: 101 mmol/L (ref 98–111)
Creatinine, Ser: 1.04 mg/dL (ref 0.61–1.24)
GFR, Estimated: >60 mL/min (ref >60)
Glucose, Bld: 111 mg/dL — ABNORMAL HIGH (ref 70–99)
Potassium: 4.8 mmol/L (ref 3.5–5.1)
Sodium: 133 mmol/L — ABNORMAL LOW (ref 135–145)
Total Bilirubin: 0.8 mg/dL (ref <1.2)
Total Protein: 6.6 g/dL (ref 6.5–8.1)

## 2023-05-30 LAB — TROPONIN I (HIGH SENSITIVITY)
Troponin I (High Sensitivity): 7 ng/L (ref <18)
Troponin I (High Sensitivity): 4 ng/L (ref <18)

## 2023-05-30 LAB — MAGNESIUM: Magnesium: 1.6 mg/dL — ABNORMAL LOW (ref 1.7–2.4)

## 2023-05-30 MED ORDER — METHYLPREDNISOLONE SODIUM SUCC 125 MG IJ SOLR
125.0000 mg | Freq: Once | INTRAMUSCULAR | Status: AC
  Administered 2023-05-30: 125 mg via INTRAVENOUS
  Filled 2023-05-30: qty 2

## 2023-05-30 MED ORDER — IOHEXOL 350 MG/ML SOLN
75.0000 mL | Freq: Once | INTRAVENOUS | Status: AC | PRN
  Administered 2023-05-30: 75 mL via INTRAVENOUS

## 2023-05-30 MED ORDER — AZITHROMYCIN 250 MG PO TABS: 250.0000 mg | ORAL_TABLET | Freq: Every day | ORAL | 0 refills | Status: DC

## 2023-05-30 MED ORDER — IPRATROPIUM-ALBUTEROL 0.5-2.5 (3) MG/3ML IN SOLN
3.0000 mL | Freq: Once | RESPIRATORY_TRACT | Status: AC
  Administered 2023-05-30: 3 mL via RESPIRATORY_TRACT
  Filled 2023-05-30: qty 3

## 2023-05-30 MED ORDER — DEXTROSE 5 % IV SOLN
500.0000 mg | Freq: Once | INTRAVENOUS | Status: AC
  Administered 2023-05-30: 500 mg via INTRAVENOUS
  Filled 2023-05-30: qty 5

## 2023-05-30 MED ORDER — SODIUM CHLORIDE 0.9 % IV SOLN
1.0000 g | Freq: Once | INTRAVENOUS | Status: AC
  Administered 2023-05-30: 1 g via INTRAVENOUS
  Filled 2023-05-30: qty 10

## 2023-05-30 MED ORDER — KETOROLAC TROMETHAMINE 15 MG/ML IJ SOLN
15.0000 mg | Freq: Once | INTRAMUSCULAR | Status: AC
  Administered 2023-05-30: 15 mg via INTRAVENOUS
  Filled 2023-05-30: qty 1

## 2023-05-30 MED ORDER — MAGNESIUM SULFATE 2 GM/50ML IV SOLN
2.0000 g | Freq: Once | INTRAVENOUS | Status: AC
  Administered 2023-05-30: 2 g via INTRAVENOUS
  Filled 2023-05-30: qty 50

## 2023-05-30 NOTE — ED Triage Notes (Signed)
Pt sent by UC for hypotension and hypoxia. Pt tested COVID + at home on Saturday and today. Pt is tachypneic. Pt c/o SOBx2d.

## 2023-05-30 NOTE — ED Notes (Signed)
Pt placed on 2l [place] due to elevated rr

## 2023-05-30 NOTE — ED Notes (Addendum)
PT ambulated to the restroom SPO2 remained at 97

## 2023-05-30 NOTE — ED Provider Notes (Signed)
Here today for evaluation of cough, congestion, shortness of breath has been getting worse over the last several days.  He tested positive for COVID 2 days ago.  He reports that shortness of breath seems to be worsening.  Recommended further evaluation in the emergency room given tachypnea.  Patient is here today with mother who will drive him via POV to local ED for further evaluation and treatment.   Tomi Bamberger, PA-C 05/30/23 1029

## 2023-05-30 NOTE — Telephone Encounter (Signed)
RX REFILL REQUEST  divalproex (DEPAKOTE) 500 MG DR tablet                         && QUEtiapine (SEROQUEL) 100 MG tablet    [id number] - [place], [place] - [address]

## 2023-05-30 NOTE — ED Provider Notes (Signed)
[place] EMERGENCY DEPARTMENT AT [facility] Provider Note   CSN: [id number] Arrival date & time: 05/30/23  1042     History  No chief complaint on file.   Keith Gibbs is a 62 y.o. male.  11 tient presents with URI symptoms.  Went to urgent care and referred to the emergency department.  He had a positive home COVID test on Saturday.  Mom is at bedside.  Patient does have history of COPD and is a current smoker with about 1 pack/day.  Also has history of CAD.  He is tachypneic.  Does not wear baseline supplemental O2 but currently wearing 2 L via nasal cannula.  Does endorse pleuritic chest pain.  The history is provided by the patient. No language interpreter was used.       Home Medications Prior to Admission medications   Medication Sig Start Date End Date Taking? Authorizing Provider  albuterol (VENTOLIN HFA) 108 (90 Base) MCG/ACT inhaler Inhale 2 puffs into the lungs every 6 (six) hours as needed for wheezing or shortness of breath. Patient taking differently: Inhale 2 puffs into the lungs every 6 (six) hours as needed for wheezing or shortness of breath. Last used last night 02/03/23   Sarita Bottom, MD  albuterol (VENTOLIN HFA) 108 (90 Base) MCG/ACT inhaler Inhale 2 puffs into the lungs every 4 (four) hours as needed for wheezing or shortness of breath. 05/28/19   [provider]  aspirin EC 81 MG tablet Take 1 tablet (81 mg total) by mouth daily. Swallow whole. 02/03/23   Sarita Bottom, MD  atorvastatin (LIPITOR) 80 MG tablet Take 1 tablet (80 mg total) by mouth daily. 02/03/23   Sarita Bottom, MD  Budeson-Glycopyrrol-Formoterol (BREZTRI AEROSPHERE) 160-9-4.8 MCG/ACT AERO Inhale 2 puffs into the lungs in the morning and at bedtime. 02/03/23   Sarita Bottom, MD  Buprenorphine HCl-Naloxone HCl 8-2 MG FILM Place 1 Film under the tongue 2 (two) times daily.    [provider]  cyclobenzaprine (FLEXERIL) 10 MG tablet Take 1 tablet (10 mg total) by mouth  3 (three) times daily as needed for muscle spasms. 02/03/23   Sarita Bottom, MD  diclofenac Sodium (VOLTAREN) 1 % GEL Apply 2 g topically 4 (four) times daily. 02/03/23   Sarita Bottom, MD  Diclofenac Sodium 3 % GEL Apply 1 Application topically 2 (two) times daily.    [provider]  divalproex (DEPAKOTE) 250 MG DR tablet Take 250 mg by mouth 2 (two) times daily. 10/04/21   [provider]  divalproex (DEPAKOTE) 500 MG DR tablet Take 1 tablet in the morning and 2 tablets at bedtime 05/26/23   Nelly Rout, MD  DULoxetine (CYMBALTA) 30 MG capsule Take 3 capsules (90 mg total) by mouth daily. 05/26/23   Nelly Rout, MD  gabapentin (NEURONTIN) 600 MG tablet Take 1 tablet (600 mg total) by mouth daily AND 2 tablets (1,200 mg total) at bedtime. 05/26/23   Nelly Rout, MD  hydrOXYzine (ATARAX) 25 MG tablet Take 1 tablet (25 mg total) by mouth 2 (two) times daily as needed for anxiety. 05/26/23   Nelly Rout, MD  ibuprofen (ADVIL) 800 MG tablet Take 800 mg by mouth every 8 (eight) hours as needed. 04/18/23   [provider]  nicotine (NICODERM CQ - DOSED IN MG/24 HOURS) 14 mg/24hr patch Place 14 mg onto the skin as needed.    [provider]  nitroGLYCERIN (NITROSTAT) 0.4 MG SL tablet Place 1 tablet (0.4 mg total) under the  tongue every 5 (five) minutes as needed for chest pain. 02/03/23 02/03/24  Sarita Bottom, MD  oxyCODONE (OXY IR/ROXICODONE) 5 MG immediate release tablet Take 5 mg by mouth every 4 (four) hours as needed for breakthrough pain. 05/03/23   [provider]  pantoprazole (PROTONIX) 40 MG tablet Take 1 tablet (40 mg total) by mouth daily. 02/04/23   Sarita Bottom, MD  QUEtiapine (SEROQUEL) 100 MG tablet Take 1 tablet (100 mg total) by mouth at bedtime. 05/26/23   Nelly Rout, MD  risperiDONE (RISPERDAL) 0.5 MG tablet Take 1 tablet (0.5 mg total) by mouth daily. 05/26/23   Nelly Rout, MD  traMADol (ULTRAM) 50 MG tablet Take 1 tablet (50 mg  total) by mouth every 12 (twelve) hours as needed. Do not take while operating a motor vehicle 05/24/23 05/23/24  Magnant, Leonette Most L, PA-C  traZODone (DESYREL) 100 MG tablet Take 1 tablet (100 mg total) by mouth at bedtime as needed. for sleep 05/26/23   Nelly Rout, MD  traZODone (DESYREL) 100 MG tablet Take 100 mg by mouth at bedtime.    [provider]  triamcinolone cream (KENALOG) 0.1 % Apply 1 Application topically 2 (two) times daily. 02/13/23   Mayers, Cari S, PA-C      Allergies    Asenapine, Dextromethorphan hbr, Guaifenesin, Latuda [lurasidone hcl], Lurasidone, and Phenylephrine    Review of Systems   Review of Systems  Constitutional:  Negative for chills and fever.  HENT:  Positive for congestion.   Respiratory:  Positive for cough and shortness of breath.   Cardiovascular:  Positive for chest pain. Negative for palpitations and leg swelling.  Gastrointestinal:  Negative for abdominal pain.  Neurological:  Negative for light-headedness.  All other systems reviewed and are negative.   Physical Exam Updated Vital Signs BP 108/69   Pulse 63   Temp 98.1 F (36.7 C) (Oral)   Resp (!) 27   Ht 6\' 3"  (1.905 m)   Wt 108 kg   SpO2 97%   BMI 29.76 kg/m  Physical Exam Vitals and nursing note reviewed.  Constitutional:      General: He is not in acute distress.    Appearance: Normal appearance. He is not ill-appearing.  HENT:     Head: Normocephalic and atraumatic.     Nose: Nose normal.  Eyes:     Conjunctiva/sclera: Conjunctivae normal.  Cardiovascular:     Rate and Rhythm: Normal rate and regular rhythm.  Pulmonary:     Effort: Pulmonary effort is normal. No respiratory distress.     Breath sounds: No wheezing.     Comments: Coarse lung sounds throughout Musculoskeletal:        General: No deformity. Normal range of motion.     Cervical back: Normal range of motion.  Skin:    Findings: No rash.  Neurological:     Mental Status: He is alert.      ED Results / Procedures / Treatments   Labs (all labs ordered are listed, but only abnormal results are displayed) Labs Reviewed  COMPREHENSIVE METABOLIC PANEL - Abnormal; Notable for the following components:      Result Value   Sodium 133 (*)    CO2 21 (*)    Glucose, Bld 111 (*)    Albumin 3.3 (*)    All other components within normal limits  CBC - Abnormal; Notable for the following components:   WBC 10.9 (*)    All other components within normal limits  I-STAT VENOUS BLOOD  GAS, ED - Abnormal; Notable for the following components:   pO2, Ven 27 (*)    Sodium 134 (*)    All other components within normal limits  RESP PANEL BY RT-PCR (RSV, FLU A&B, COVID)  RVPGX2  BRAIN NATRIURETIC PEPTIDE  MAGNESIUM  I-STAT CG4 LACTIC ACID, ED  TROPONIN I (HIGH SENSITIVITY)    EKG EKG Interpretation Date/Time:  Tuesday May 30 2023 11:01:10 EST Ventricular Rate:  68 PR Interval:  152 QRS Duration:  86 QT Interval:  404 QTC Calculation: 429 R Axis:   1  Text Interpretation: Normal sinus rhythm Normal ECG When compared with ECG of 28-Jan-2023 09:13, PREVIOUS ECG IS PRESENT since last tracing no significant change Confirmed by Rolan Bucco [phone]) on 05/30/2023 11:06:10 AM  Radiology No results found.  Procedures Procedures    Medications Ordered in ED Medications - No data to display  ED Course/ Medical Decision Making/ A&P Clinical Course as of 05/30/23 1510  Tue May 30, 2023  1501 Shob and pleuritc CP. Was hypoxic at urgect care but not here. Pending PE study. If feeling better and neg PE study, can go home. Getting COPD treatment now.  [JR]    Clinical Course User Index [JR] Gareth Eagle, PA-C                                 Medical Decision Making Amount and/or Complexity of Data Reviewed Labs: ordered. Radiology: ordered.  Risk Prescription drug management.   Medical Decision Making / ED Course   This patient presents to the ED for concern of  shortness of breath, chest pain, cough, this involves an extensive number of treatment options, and is a complaint that carries with it a high risk of complications and morbidity.  The differential diagnosis includes viral URI, PE, pneumonia, ACS, hypoxic respiratory failure  MDM: 62 year old male with past medical history as noted above presents today for concern of chest pain, shortness of breath, and URI symptoms.  Reportedly he was hypoxic at urgent care.  Here he is maintaining O2 sats of mid to upper 90s on supplemental O2.  I did turn off patient supplemental O2 and he maintained mid to upper 90s despite ambulating in the room however his work of breathing did increase and he endorses pleuritic chest pain.  No prior history of PE or DVT.  Labs obtained.  Chest x-ray obtained.  CT angio chest PE study ordered.  EKG without acute ischemic change.  Respiratory panel negative.  Initial troponin of 7.  CBC with leukocytosis of 10.9 otherwise without anemia.  CMP without acute concerns.  VBG without acute concerns.  Lactic acid is 1.1.  Magnesium 1.6. . Does have history of COPD.  COPD treatment ordered with Rocephin, Zithromax, Solu-Medrol, and magnesium.  Will need reevaluation following imaging.  Lab Tests: -I ordered, reviewed, and interpreted labs.   The pertinent results include:   Labs Reviewed  COMPREHENSIVE METABOLIC PANEL - Abnormal; Notable for the following components:      Result Value   Sodium 133 (*)    CO2 21 (*)    Glucose, Bld 111 (*)    Albumin 3.3 (*)    All other components within normal limits  CBC - Abnormal; Notable for the following components:   WBC 10.9 (*)    All other components within normal limits  MAGNESIUM - Abnormal; Notable for the following components:   Magnesium 1.6 (*)  All other components within normal limits  I-STAT VENOUS BLOOD GAS, ED - Abnormal; Notable for the following components:   pO2, Ven 27 (*)    Sodium 134 (*)    All other  components within normal limits  RESP PANEL BY RT-PCR (RSV, FLU A&B, COVID)  RVPGX2  BRAIN NATRIURETIC PEPTIDE  I-STAT CG4 LACTIC ACID, ED  I-STAT CG4 LACTIC ACID, ED  TROPONIN I (HIGH SENSITIVITY)  TROPONIN I (HIGH SENSITIVITY)      EKG  EKG Interpretation Date/Time:  Tuesday May 30 2023 11:01:10 EST Ventricular Rate:  68 PR Interval:  152 QRS Duration:  86 QT Interval:  404 QTC Calculation: 429 R Axis:   1  Text Interpretation: Normal sinus rhythm Normal ECG When compared with ECG of 28-Jan-2023 09:13, PREVIOUS ECG IS PRESENT since last tracing no significant change Confirmed by Rolan Bucco [phone]) on 05/30/2023 11:06:10 AM         Imaging Studies ordered: I ordered imaging studies including chest x-ray, CT chest PE study ordered but did not result in the end of my shift I independently visualized and interpreted imaging. I agree with the radiologist interpretation   Medicines ordered and prescription drug management: Meds ordered this encounter  Medications   ipratropium-albuterol (DUONEB) 0.5-2.5 (3) MG/3ML nebulizer solution 3 mL    -I have reviewed the patients home medicines and have made adjustments as needed  Co morbidities that complicate the patient evaluation  Past Medical History:  Diagnosis Date   Anginal pain (HCC)    Anxiety    Arthritis    Bipolar 1 disorder (HCC)    Bursitis    CAD (coronary artery disease)    Chronic pain    COPD (chronic obstructive pulmonary disease) (HCC)    Current use of long term anticoagulation    DAPT (ASA + clopidogrel)   Depression    Diverticulitis    Dyspnea    GERD (gastroesophageal reflux disease)    Grade I diastolic dysfunction    Hepatitis C 2012   No longer has Hep C   HLD (hyperlipidemia)    Hypertension    MI (myocardial infarction) (HCC)    Polysubstance abuse (HCC)    cocaine, marijuana, ETOH   PUD (peptic ulcer disease)    S/P angioplasty with stent 06/10/2016   a.) 90% stenosis of  pLAD to mLAD - 2.5 x 18 mm Xience Alpine (DES x 1) placed to pLAD   S/P PTCA (percutaneous transluminal coronary angioplasty) 12/04/2019   a.) 60% in stent restenosis of DES to pLAD; LVEF 65%.   Schizophrenia (HCC)    Stroke [facility])    Valvular insufficiency    a.) Mild MR, TR, PR; mild to moderate AR on 03/05/2018 TTE      Dispostion: Signed out to oncoming provider to follow-up on imaging  Final Clinical Impression(s) / ED Diagnoses Final diagnoses:  None    Rx / DC Orders ED Discharge Orders     None         Marita Kansas, PA-C 05/30/23 1513    Rolan Bucco, MD 05/31/23 [phone]

## 2023-05-30 NOTE — Telephone Encounter (Signed)
Fax request for 90 fill of Trazodone 100mg .

## 2023-05-30 NOTE — ED Provider Notes (Signed)
Accepted handoff at shift change from [facility], [place]. Please see prior provider note for more detail.   Briefly: Patient is 62 y.o. presenting with URI symptoms.  DDX: concern for iral URI, PE, pneumonia, ACS, hypoxic respiratory failure   Plan: receiving COPD treatment at this time.  Also PE study is pending.  Plan to reassess and follow-up on PE study results.   Physical Exam  BP 108/67   Pulse 62   Temp 97.8 F (36.6 C)   Resp (!) 24   Ht 6\' 3"  (1.905 m)   Wt 108 kg   SpO2 96%   BMI 29.76 kg/m   Physical Exam  Procedures  Procedures  ED Course / MDM   Clinical Course as of 05/30/23 1807  Tue May 30, 2023  1501 Shob and pleuritc CP. Was hypoxic at urgect care but not here. Pending PE study. If feeling better and neg PE study, can go home. Getting COPD treatment now.  [JR]    Clinical Course User Index [JR] Gareth Eagle, PA-C   Medical Decision Making Amount and/or Complexity of Data Reviewed Labs: ordered. Radiology: ordered.  Risk Prescription drug management.   On assessment stated that he felt much better.  CT was negative for PE.  Sent home with Zithromax.  Advised to follow-up with his PCP.  Discussed pertinent return precautions.  Vital stable.  Discharged home in good condition.       Gareth Eagle, PA-C 05/30/23 1810    Benjiman Core, MD 05/30/23 2337

## 2023-05-30 NOTE — ED Notes (Signed)
Patient is being discharged from the Urgent Care and sent to the Emergency Department via POV . Per RM, patient is in need of higher level of care due to SOB/covid. Patient is aware and verbalizes understanding of plan of care.  Vitals:   05/30/23 0950 05/30/23 0959  BP: 96/60   Pulse: 70 76  Resp: (!) 40 (!) 38  Temp: 98.3 F (36.8 C)   SpO2: 92% 92%

## 2023-05-30 NOTE — Discharge Instructions (Signed)
Suspect your symptoms are likely related to your COPD.  However I am starting on antibiotic to treat empirically for pneumonia.  I sent azithromycin to your pharmacy.  Please take the entire course even if you are feeling better.  Recommend you follow with PCP.  If you have worsening shortness of breath, chest pain, calf tenderness or any other concerning symptom please return emerged for further evaluation.

## 2023-05-30 NOTE — ED Triage Notes (Signed)
"  Started with cold symptoms last week on Wednesday, the cough/congestion/sob was getting worse by Saturday and tested + for COVID19". "Now the sob is bad". Last known Fever "up to 101.3, on/off the whole time".

## 2023-06-02 ENCOUNTER — Telehealth: Payer: Self-pay | Admitting: Orthopedic Surgery

## 2023-06-02 ENCOUNTER — Other Ambulatory Visit: Payer: Self-pay | Admitting: Orthopaedic Surgery

## 2023-06-02 MED ORDER — TRAMADOL HCL 50 MG PO TABS: 50.0000 mg | ORAL_TABLET | Freq: Two times a day (BID) | ORAL | 0 refills | Status: DC | PRN

## 2023-06-02 NOTE — Telephone Encounter (Signed)
Patient called needing Rx refilled Tr[facility]adol.  [facility] [address]  Patient asked for a call when Rx is sent to the pharmacy.  The number to contact patient is [phone]

## 2023-06-02 NOTE — Telephone Encounter (Signed)
Denied, it was refilled last week already on 11/1 when he came for his appt.

## 2023-06-02 NOTE — Telephone Encounter (Signed)
Can you advise since Dr. August Saucer is off today?

## 2023-06-02 NOTE — Telephone Encounter (Signed)
Called and advised.

## 2023-06-05 ENCOUNTER — Ambulatory Visit: Payer: Self-pay | Admitting: *Deleted

## 2023-06-05 NOTE — Telephone Encounter (Signed)
Reason for Disposition  [1] Longstanding difficulty breathing (e.g., CHF, COPD, emphysema) AND [2] WORSE than normal  Answer Assessment - Initial Assessment Questions 1. RESPIRATORY STATUS: "Describe your breathing?" (e.g., wheezing, shortness of breath, unable to speak, severe coughing)      I have shortness of breath.    I went to urgent care and they sent me to the hospital.   I was positive for Covid.   I couldn't breath very well so the ED gave me medications and IV fluids.   By the time I left I felt like I could breat better.   I'm still coughing up yellow stuff with black in it and having shortness of breath.    I have sinus congestion, sore throat, runny nose, coughing.    I need cough medicine and antibiotics.    2. ONSET: "When did this breathing problem begin?"      05/30/2023 I was seen.     I have COPD.   I had all the symptoms of Covid.  My chest x ray and MRI did not show cancer.    3. PATTERN "Does the difficult breathing come and go, or has it been constant since it started?"      Short of breath all the time.   4. SEVERITY: "How bad is your breathing?" (e.g., mild, moderate, severe)    - MILD: No SOB at rest, mild SOB with walking, speaks normally in sentences, can lie down, no retractions, pulse < 100.    - MODERATE: SOB at rest, SOB with minimal exertion and prefers to sit, cannot lie down flat, speaks in phrases, mild retractions, audible wheezing, pulse 100-120.    - SEVERE: Very SOB at rest, speaks in single words, struggling to breathe, sitting hunched forward, retractions, pulse > 120      Moderate 5. RECURRENT SYMPTOM: "Have you had difficulty breathing before?" If Yes, ask: "When was the last time?" and "What happened that time?"      Yes I have COPD 6. CARDIAC HISTORY: "Do you have any history of heart disease?" (e.g., heart attack, angina, bypass surgery, angioplasty)      Not asked 7. LUNG HISTORY: "Do you have any history of lung disease?"  (e.g., pulmonary embolus,  asthma, emphysema)     Yes  COPD 8. CAUSE: "What do you think is causing the breathing problem?"      The test in hospital was negative for Covid.    My home test was positive twice.    9. OTHER SYMPTOMS: "Do you have any other symptoms? (e.g., dizziness, runny nose, cough, chest pain, fever)     See above 10. O2 SATURATION MONITOR:  "Do you use an oxygen saturation monitor (pulse oximeter) at home?" If Yes, ask: "What is your reading (oxygen level) today?" "What is your usual oxygen saturation reading?" (e.g., 95%)       Not asked 11. PREGNANCY: "Is there any chance you are pregnant?" "When was your last menstrual period?"       N/A 12. TRAVEL: "Have you traveled out of the country in the last month?" (e.g., travel history, exposures)       Not asked  Protocols used: Breathing Difficulty-A-AH

## 2023-06-05 NOTE — Telephone Encounter (Signed)
  Chief Complaint: Shortness of breath, COPD  Seen ED 05/30/2023 for positive COVID Symptoms: Runny nose, sinus congestion, coughing up yellow mucus with black in it, shortness of breath, sore throat. Frequency: Congestion and cough lingering  Pertinent Negatives: Patient denies diarrhea or vomiting Disposition: [] ED /[] Urgent Care (no appt availability in office) / [] Appointment(In office/virtual)/ []  [place] [facility]/ [] Home Care/ [] Refused Recommended Disposition /[x] [place] [facility]/ []  Follow-up with PCP Additional Notes: No appts with Primary Care at [facility] with either provider.   Referred to the [facility] Medicine unit.   I gave him the location and hours.   He was agreeable to going to them.

## 2023-06-12 ENCOUNTER — Ambulatory Visit
Admission: RE | Admit: 2023-06-12 | Discharge: 2023-06-12 | Disposition: A | Discharge status: Home / Self Care | Payer: MEDICAID | Source: Ambulatory Visit | Attending: Surgical | Admitting: Surgical

## 2023-06-12 DIAGNOSIS — M25511 Pain in right shoulder: Secondary | ICD-10-CM

## 2023-06-12 MED ORDER — IOPAMIDOL (ISOVUE-M 200) INJECTION 41%
12.0000 mL | Freq: Once | INTRAMUSCULAR | Status: AC
  Administered 2023-06-12: 12 mL via INTRA_ARTICULAR

## 2023-06-14 ENCOUNTER — Other Ambulatory Visit (HOSPITAL_COMMUNITY): Payer: MEDICAID

## 2023-06-16 ENCOUNTER — Encounter: Payer: Self-pay | Admitting: Cardiovascular Disease

## 2023-06-16 ENCOUNTER — Ambulatory Visit (INDEPENDENT_AMBULATORY_CARE_PROVIDER_SITE_OTHER): Payer: MEDICAID | Admitting: Cardiovascular Disease

## 2023-06-16 VITALS — BP 120/70 | HR 61 | Ht 75.0 in | Wt 234.0 lb

## 2023-06-16 DIAGNOSIS — I2510 Atherosclerotic heart disease of native coronary artery without angina pectoris: Secondary | ICD-10-CM | POA: Diagnosis not present

## 2023-06-16 DIAGNOSIS — I340 Nonrheumatic mitral (valve) insufficiency: Secondary | ICD-10-CM

## 2023-06-16 DIAGNOSIS — R0602 Shortness of breath: Secondary | ICD-10-CM | POA: Diagnosis not present

## 2023-06-16 DIAGNOSIS — I10 Essential (primary) hypertension: Secondary | ICD-10-CM | POA: Diagnosis not present

## 2023-06-16 DIAGNOSIS — Z9861 Coronary angioplasty status: Secondary | ICD-10-CM

## 2023-06-16 DIAGNOSIS — I351 Nonrheumatic aortic (valve) insufficiency: Secondary | ICD-10-CM

## 2023-06-16 MED ORDER — PANTOPRAZOLE SODIUM 40 MG PO TBEC: 40.0000 mg | DELAYED_RELEASE_TABLET | Freq: Every day | ORAL | 0 refills | Status: DC

## 2023-06-16 NOTE — Progress Notes (Signed)
Cardiology Office Note   Date:  06/16/2023   ID:  Keith Gibbs, DOB [birth year], MRN [id number]  PCP:  Rema Fendt, NP  Cardiologist:  Adrian Blackwater, MD      History of Present Illness: Keith Gibbs is a 62 y.o. male who presents for  Chief Complaint  Patient presents with   Follow-up    3 month follow up    Had pneumonia, and COPD exacerbation, still coughing      Past Medical History:  Diagnosis Date   Anginal pain (HCC)    Anxiety    Arthritis    Bipolar 1 disorder (HCC)    Bursitis    CAD (coronary artery disease)    Chronic pain    COPD (chronic obstructive pulmonary disease) (HCC)    Current use of long term anticoagulation    DAPT (ASA + clopidogrel)   Depression    Diverticulitis    Dyspnea    GERD (gastroesophageal reflux disease)    Grade I diastolic dysfunction    Hepatitis C 2012   No longer has Hep C   HLD (hyperlipidemia)    Hypertension    MI (myocardial infarction) (HCC)    Polysubstance abuse (HCC)    cocaine, marijuana, ETOH   PUD (peptic ulcer disease)    S/P angioplasty with stent 06/10/2016   a.) 90% stenosis of pLAD to mLAD - 2.5 x 18 mm Xience Alpine (DES x 1) placed to pLAD   S/P PTCA (percutaneous transluminal coronary angioplasty) 12/04/2019   a.) 60% in stent restenosis of DES to pLAD; LVEF 65%.   Schizophrenia (HCC)    Stroke [facility])    Valvular insufficiency    a.) Mild MR, TR, PR; mild to moderate AR on 03/05/2018 TTE     Past Surgical History:  Procedure Laterality Date   ABDOMINAL SURGERY     removed small piece of intestines due to [facility] Diverticulosis   ANTERIOR LAT LUMBAR FUSION N/A 08/23/2022   Procedure: DIRECT LATERAL INTERBODY FUSION  LUMBAR TWO- LUMBAR THREE, LUMBAR THREE-LUMBAR FOUR , EXPLORE AND EXTEND FUSION LUMBAR TWO-LUMBAR FIVE, POSTERIOR DECOMPRESSION LUMBAR THREE-LUMBAR FOUR, LEFT LUMBAR TWO-LUMBAR THREE;  Surgeon: Bedelia Person, MD;  Location: [facility] OR;  Service: Neurosurgery;  Laterality:  N/A;   APPENDECTOMY     BACK SURGERY     CARDIAC CATHETERIZATION Left 06/10/2016   Procedure: Left Heart Cath and Coronary Angiography;  Surgeon: Laurier Nancy, MD;  Location: [facility] INVASIVE CV LAB;  Service: Cardiovascular;  Laterality: Left;   CARDIAC CATHETERIZATION N/A 06/10/2016   Procedure: Coronary Stent Intervention;  Surgeon: Alwyn Pea, MD;  Location: [facility] INVASIVE CV LAB;  Service: Cardiovascular;  Laterality: N/A;   CHOLECYSTECTOMY N/A 02/17/2022   Procedure: LAPAROSCOPIC CHOLECYSTECTOMY;  Surgeon: Sheliah Hatch De Blanch, MD;  Location: [facility] OR;  Service: General;  Laterality: N/A;   COLON SURGERY     COLONOSCOPY     COLONOSCOPY WITH PROPOFOL N/A 01/05/2017   Procedure: COLONOSCOPY WITH PROPOFOL;  Surgeon: Wyline Mood, MD;  Location: [facility] ENDOSCOPY;  Service: Endoscopy;  Laterality: N/A;   COLONOSCOPY WITH PROPOFOL N/A 02/13/2020   Procedure: COLONOSCOPY WITH PROPOFOL;  Surgeon: Pasty Spillers, MD;  Location: [facility] ENDOSCOPY;  Service: Endoscopy;  Laterality: N/A;   CORONARY ANGIOPLASTY WITH STENT PLACEMENT     CORONARY PRESSURE/FFR STUDY N/A 12/04/2019   Procedure: INTRAVASCULAR PRESSURE WIRE/FFR STUDY;  Surgeon: Tonny Bollman, MD;  Location: [facility] INVASIVE CV LAB;  Service: Cardiovascular;  Laterality: N/A;  ESOPHAGOGASTRODUODENOSCOPY (EGD) WITH PROPOFOL N/A 01/05/2017   Procedure: ESOPHAGOGASTRODUODENOSCOPY (EGD) WITH PROPOFOL;  Surgeon: Wyline Mood, MD;  Location: [facility] ENDOSCOPY;  Service: Endoscopy;  Laterality: N/A;   ESOPHAGOGASTRODUODENOSCOPY (EGD) WITH PROPOFOL N/A 02/13/2020   Procedure: ESOPHAGOGASTRODUODENOSCOPY (EGD) WITH PROPOFOL;  Surgeon: Pasty Spillers, MD;  Location: [facility] ENDOSCOPY;  Service: Endoscopy;  Laterality: N/A;   KNEE ARTHROSCOPY WITH MEDIAL MENISECTOMY Right 09/05/2017   Procedure: KNEE ARTHROSCOPY WITH MEDIAL AND LATERAL  MENISECTOMY PARTIAL SYNOVECTOMY;  Surgeon: Kennedy Bucker, MD;  Location: [facility] ORS;  Service: Orthopedics;  Laterality:  Right;   LEFT HEART CATH AND CORONARY ANGIOGRAPHY N/A 12/04/2019   Procedure: LEFT HEART CATH AND CORONARY ANGIOGRAPHY;  Surgeon: Tonny Bollman, MD;  Location: [facility] INVASIVE CV LAB;  Service: Cardiovascular;  Laterality: N/A;   LEFT HEART CATH AND CORONARY ANGIOGRAPHY Left 11/25/2022   Procedure: LEFT HEART CATH AND CORONARY ANGIOGRAPHY;  Surgeon: Laurier Nancy, MD;  Location: [facility] INVASIVE CV LAB;  Service: Cardiovascular;  Laterality: Left;   SHOULDER SURGERY Right 04/09/2012   SPINE SURGERY     TOTAL KNEE ARTHROPLASTY Right 01/19/2021   Procedure: TOTAL KNEE ARTHROPLASTY - [place] Neighbor to Assist;  Surgeon: Kennedy Bucker, MD;  Location: [facility] ORS;  Service: Orthopedics;  Laterality: Right;     Current Outpatient Medications  Medication Sig Dispense Refill   albuterol (VENTOLIN HFA) 108 (90 Base) MCG/ACT inhaler Inhale 2 puffs into the lungs every 6 (six) hours as needed for wheezing or shortness of breath. (Patient taking differently: Inhale 2 puffs into the lungs every 6 (six) hours as needed for wheezing or shortness of breath. Last used last night) 18 g 6   aspirin EC 81 MG tablet Take 1 tablet (81 mg total) by mouth daily. Swallow whole. 30 tablet 0   atorvastatin (LIPITOR) 80 MG tablet Take 1 tablet (80 mg total) by mouth daily. 30 tablet 0   Budeson-Glycopyrrol-Formoterol (BREZTRI AEROSPHERE) 160-9-4.8 MCG/ACT AERO Inhale 2 puffs into the lungs in the morning and at bedtime. 1 each 0   diclofenac Sodium (VOLTAREN) 1 % GEL Apply 2 g topically 4 (four) times daily. 2 g 0   divalproex (DEPAKOTE) 500 MG DR tablet Take 1 tablet in the morning and 2 tablets at bedtime 90 tablet 1   DULoxetine (CYMBALTA) 30 MG capsule Take 3 capsules (90 mg total) by mouth daily. 90 capsule 1   gabapentin (NEURONTIN) 600 MG tablet Take 1 tablet (600 mg total) by mouth daily AND 2 tablets (1,200 mg total) at bedtime. 90 tablet 0   hydrOXYzine (ATARAX) 25 MG tablet Take 1 tablet (25 mg total) by mouth 2 (two)  times daily as needed for anxiety. 60 tablet 1   ibuprofen (ADVIL) 800 MG tablet Take 800 mg by mouth every 8 (eight) hours as needed.     nicotine (NICODERM CQ - DOSED IN MG/24 HOURS) 14 mg/24hr patch Place 14 mg onto the skin as needed.     QUEtiapine (SEROQUEL) 100 MG tablet Take 1 tablet (100 mg total) by mouth at bedtime. 30 tablet 1   risperiDONE (RISPERDAL) 0.5 MG tablet Take 1 tablet (0.5 mg total) by mouth daily. 30 tablet 0   traZODone (DESYREL) 100 MG tablet Take 1 tablet (100 mg total) by mouth at bedtime as needed. for sleep 30 tablet 1   albuterol (VENTOLIN HFA) 108 (90 Base) MCG/ACT inhaler Inhale 2 puffs into the lungs every 4 (four) hours as needed for wheezing or shortness of breath. (Patient not taking: Reported on 06/16/2023)  azithromycin (ZITHROMAX) 250 MG tablet Take 1 tablet (250 mg total) by mouth daily. Take first 2 tablets together, then 1 every day until finished. (Patient not taking: Reported on 06/16/2023) 6 tablet 0   Buprenorphine HCl-Naloxone HCl 8-2 MG FILM Place 1 Film under the tongue 2 (two) times daily. (Patient not taking: Reported on 06/16/2023)     cyclobenzaprine (FLEXERIL) 10 MG tablet Take 1 tablet (10 mg total) by mouth 3 (three) times daily as needed for muscle spasms. (Patient not taking: Reported on 06/16/2023) 50 tablet 0   Diclofenac Sodium 3 % GEL Apply 1 Application topically 2 (two) times daily. (Patient not taking: Reported on 06/16/2023)     divalproex (DEPAKOTE) 250 MG DR tablet Take 250 mg by mouth 2 (two) times daily. (Patient not taking: Reported on 06/16/2023)     nitroGLYCERIN (NITROSTAT) 0.4 MG SL tablet Place 1 tablet (0.4 mg total) under the tongue every 5 (five) minutes as needed for chest pain. (Patient not taking: Reported on 06/16/2023) 30 tablet 0   oxyCODONE (OXY IR/ROXICODONE) 5 MG immediate release tablet Take 5 mg by mouth every 4 (four) hours as needed for breakthrough pain. (Patient not taking: Reported on 06/16/2023)      pantoprazole (PROTONIX) 40 MG tablet Take 1 tablet (40 mg total) by mouth daily. 30 tablet 0   traMADol (ULTRAM) 50 MG tablet Take 1 tablet (50 mg total) by mouth every 12 (twelve) hours as needed. Do not take while operating a motor vehicle (Patient not taking: Reported on 06/16/2023) 30 tablet 0   traZODone (DESYREL) 100 MG tablet Take 100 mg by mouth at bedtime. (Patient not taking: Reported on 06/16/2023)     triamcinolone cream (KENALOG) 0.1 % Apply 1 Application topically 2 (two) times daily. (Patient not taking: Reported on 06/16/2023) 30 g 0   No current facility-administered medications for this visit.    Allergies:   Asenapine, Dextromethorphan hbr, Guaifenesin, Latuda [lurasidone hcl], Lurasidone, and Phenylephrine    Social History:   reports that he has been smoking cigarettes. He started smoking about 33 years ago. He has a 16 pack-year smoking history. He has been exposed to tobacco smoke. He has never used smokeless tobacco. He reports that he does not currently use alcohol after a past usage of about 3.0 standard drinks of alcohol per week. He reports that he does not currently use drugs after having used the following drugs: Cocaine and Marijuana.   Family History:  family history includes Depression in his mother; Early death in his father; Heart attack in his father; Heart disease in his father and mother; Hypertension in his father, mother, and sister; Osteoarthritis in his mother; Parkinson's disease in his maternal grandfather.    ROS:     Review of Systems  Constitutional: Negative.   HENT: Negative.    Eyes: Negative.   Respiratory: Negative.    Gastrointestinal: Negative.   Genitourinary: Negative.   Musculoskeletal: Negative.   Skin: Negative.   Neurological: Negative.   Endo/Heme/Allergies: Negative.   Psychiatric/Behavioral: Negative.    All other systems reviewed and are negative.     All other systems are reviewed and negative.    PHYSICAL EXAM: VS:   BP 120/70   Pulse 61   Ht 6\' 3"  (1.905 m)   Wt 234 lb (106.1 kg)   SpO2 96%   BMI 29.25 kg/m  , BMI Body mass index is 29.25 kg/m. Last weight:  Wt Readings from Last 3 Encounters:  06/16/23 234 lb (  106.1 kg)  05/30/23 238 lb 1.6 oz (108 kg)  05/30/23 238 lb (108 kg)     Physical Exam Vitals reviewed.  Constitutional:      Appearance: Normal appearance. He is normal weight.  HENT:     Head: Normocephalic.     Nose: Nose normal.     Mouth/Throat:     Mouth: Mucous membranes are moist.  Eyes:     Pupils: Pupils are equal, round, and reactive to light.  Cardiovascular:     Rate and Rhythm: Normal rate and regular rhythm.     Pulses: Normal pulses.     Heart sounds: Normal heart sounds.  Pulmonary:     Effort: Pulmonary effort is normal.  Abdominal:     General: Abdomen is flat. Bowel sounds are normal.  Musculoskeletal:        General: Normal range of motion.     Cervical back: Normal range of motion.  Skin:    General: Skin is warm.  Neurological:     General: No focal deficit present.     Mental Status: He is alert.  Psychiatric:        Mood and Affect: Mood normal.       EKG:   Recent Labs: 02/13/2023: TSH 1.970 05/30/2023: ALT 13; B Natriuretic Peptide 34.3; BUN 12; Creatinine, Ser 1.04; Hemoglobin 15.0; Magnesium 1.6; Platelets 222; Potassium 5.1; Sodium 134    Lipid Panel    Component Value Date/Time   CHOL 83 04/26/2022 1530   CHOL 92 (L) 08/31/2021 1408   TRIG 52 04/26/2022 1530   HDL 28 (L) 04/26/2022 1530   HDL 33 (L) 08/31/2021 1408   CHOLHDL 3.0 04/26/2022 1530   VLDL 10 04/26/2022 1530   LDLCALC 45 04/26/2022 1530   LDLCALC 41 08/31/2021 1408      Other studies Reviewed: Additional studies/ records that were reviewed today include:  Review of the above records demonstrates:       No data to display            ASSESSMENT AND PLAN:    ICD-10-CM   1. CAD S/P percutaneous coronary angioplasty  I25.10 pantoprazole (PROTONIX)  40 MG tablet   Z98.61    No chest pain    2. Essential hypertension  I10 pantoprazole (PROTONIX) 40 MG tablet    3. SOB (shortness of breath)  R06.02 pantoprazole (PROTONIX) 40 MG tablet   Still SOB, but recovering from pneumonia    4. Nonrheumatic mitral valve regurgitation  I34.0 pantoprazole (PROTONIX) 40 MG tablet    5. Nonrheumatic aortic valve insufficiency  I35.1 pantoprazole (PROTONIX) 40 MG tablet       Problem List Items Addressed This Visit       Cardiovascular and Mediastinum   CAD S/P percutaneous coronary angioplasty - Primary (Chronic)   Relevant Medications   pantoprazole (PROTONIX) 40 MG tablet   Essential hypertension   Relevant Medications   pantoprazole (PROTONIX) 40 MG tablet   Other Visit Diagnoses     SOB (shortness of breath)       Still SOB, but recovering from pneumonia   Relevant Medications   pantoprazole (PROTONIX) 40 MG tablet   Nonrheumatic mitral valve regurgitation       Relevant Medications   pantoprazole (PROTONIX) 40 MG tablet   Nonrheumatic aortic valve insufficiency       Relevant Medications   pantoprazole (PROTONIX) 40 MG tablet          Disposition:   Return in about  3 months (around 09/16/2023).    Total time spent: 30 minutes  Signed,  Adrian Blackwater, MD  06/16/2023 11:13 AM    [facility]

## 2023-06-19 ENCOUNTER — Ambulatory Visit (INDEPENDENT_AMBULATORY_CARE_PROVIDER_SITE_OTHER): Payer: MEDICAID | Admitting: Orthopedic Surgery

## 2023-06-19 ENCOUNTER — Encounter: Payer: Self-pay | Admitting: Orthopedic Surgery

## 2023-06-19 ENCOUNTER — Telehealth (HOSPITAL_COMMUNITY): Payer: Self-pay | Admitting: Student in an Organized Health Care Education/Training Program

## 2023-06-19 DIAGNOSIS — M25511 Pain in right shoulder: Secondary | ICD-10-CM | POA: Diagnosis not present

## 2023-06-19 MED ORDER — TRAMADOL HCL 50 MG PO TABS: 50.0000 mg | ORAL_TABLET | Freq: Two times a day (BID) | ORAL | 0 refills | Status: DC | PRN

## 2023-06-19 NOTE — Telephone Encounter (Signed)
I called the pharmacy, it was going under old refills, they are filling the new rx that were written most recently. Thanks

## 2023-06-20 ENCOUNTER — Encounter: Payer: Self-pay | Admitting: Orthopedic Surgery

## 2023-06-20 ENCOUNTER — Encounter: Payer: Self-pay | Admitting: Family

## 2023-06-20 ENCOUNTER — Ambulatory Visit (INDEPENDENT_AMBULATORY_CARE_PROVIDER_SITE_OTHER): Payer: MEDICAID | Admitting: Family

## 2023-06-20 VITALS — BP 109/70 | HR 60 | Temp 97.5°F | Ht 75.0 in | Wt 231.2 lb

## 2023-06-20 DIAGNOSIS — J431 Panlobular emphysema: Secondary | ICD-10-CM | POA: Diagnosis not present

## 2023-06-20 DIAGNOSIS — J449 Chronic obstructive pulmonary disease, unspecified: Secondary | ICD-10-CM

## 2023-06-20 DIAGNOSIS — K219 Gastro-esophageal reflux disease without esophagitis: Secondary | ICD-10-CM | POA: Diagnosis not present

## 2023-06-20 MED ORDER — PANTOPRAZOLE SODIUM 40 MG PO TBEC: 40.0000 mg | DELAYED_RELEASE_TABLET | Freq: Every day | ORAL | 0 refills | Status: DC

## 2023-06-20 MED ORDER — BREZTRI AEROSPHERE 160-9-4.8 MCG/ACT IN AERO: 2.0000 | INHALATION_SPRAY | Freq: Two times a day (BID) | RESPIRATORY_TRACT | 0 refills | Status: DC

## 2023-06-20 MED ORDER — BREZTRI AEROSPHERE 160-9-4.8 MCG/ACT IN AERO: 2.0000 | INHALATION_SPRAY | Freq: Two times a day (BID) | RESPIRATORY_TRACT | 2 refills | Status: DC

## 2023-06-20 MED ORDER — ALBUTEROL SULFATE HFA 108 (90 BASE) MCG/ACT IN AERS: 2.0000 | INHALATION_SPRAY | Freq: Four times a day (QID) | RESPIRATORY_TRACT | 2 refills | Status: DC | PRN

## 2023-06-20 MED ORDER — ALBUTEROL SULFATE HFA 108 (90 BASE) MCG/ACT IN AERS: 2.0000 | INHALATION_SPRAY | Freq: Four times a day (QID) | RESPIRATORY_TRACT | 6 refills | Status: DC | PRN

## 2023-06-20 NOTE — Progress Notes (Signed)
Patient states he saw another Dr yesterday and he stated he needs a complete shoulder replacement.   Patient wants to get acid reflux medication.

## 2023-06-20 NOTE — Progress Notes (Signed)
Office Visit Note   Patient: Keith Gibbs           Date of Birth: 1961-07-25           MRN: [id number] Visit Date: 06/19/2023 Requested by: Rema Fendt, NP [address] [place],  [place] [address] PCP: Rema Fendt, NP  Subjective: Chief Complaint  Patient presents with   Right Shoulder - Follow-up    MRI review    HPI: Keith Gibbs is a 62 y.o. male who presents to the office reporting continued right shoulder pain.  Since he was last seen has had an MRI scan which shows severe degenerative glenohumeral joint arthritis along with prior tenodesis of the biceps tendon as well as marked thinning and irregularity of the subscapularis tendon compatible with high-grade partial-thickness articular surface tearing with a point of communication in the glenohumeral joint due to full-thickness partial width tear of the upper margin of the subscap.  This has been previously repaired.  There is also moderate subscap muscular atrophy.  Patient reports pain more than weakness in that right shoulder.  He has had prior subscap repair performed and the anchor tracks are visible near the somewhat tattered subscap attachment onto the lesser tuberosity.  Glenohumeral arthritis is also severe.  Hard for him to pick up a glass of tea.  Has had a cervical spine injection which gave him no help.  Shoulder injection helped for several days.  Pain wakes him from sleep at night.  Tramadol is moderately helpful for pain.  Patient is a smoker.  His current shoulder pain is forcing him to become left-handed.  Does have a history of cardiac intervention..                ROS: All systems reviewed are negative as they relate to the chief complaint within the history of present illness.  Patient denies fevers or chills.  Assessment & Plan: Visit Diagnoses:  1. Acute pain of right shoulder     Plan: Impression is right shoulder pain with severe glenohumeral joint arthritis and compromised subscap  tendon.  Tramadol refilled.  We talked a lot today about operative and nonoperative treatment options.  I do not think that any type of augmented repair of the subscap would give him pain relief that he is seeking due to the severity of the glenohumeral arthritis.  I also do not think that standard shoulder replacement in this smoker who has had prior subscap repair and has a full-thickness retear of the subscap with atrophy is indicated.  His best hope for pain relief and no significant loss of function would be reverse shoulder replacement.  The risk and benefits are discussed with the patient include not limited to infection nerve vessel damage instability along with incomplete pain relief and incomplete restoration of function.  Extensive nature of the rehabilitative process is also discussed.  Did get CT scan pending for patient specific instrumentation to optimize implant position in this young patient.  All questions answered.  Follow-Up Instructions: No follow-ups on file.   Orders:  Orders Placed This Encounter  Procedures   CT SHOULDER RIGHT WO CONTRAST   Meds ordered this encounter  Medications   traMADol (ULTRAM) 50 MG tablet    Sig: Take 1 tablet (50 mg total) by mouth every 12 (twelve) hours as needed.    Dispense:  30 tablet    Refill:  0      Procedures: No procedures performed  Clinical Data: No additional findings.  Objective: Vital Signs: There were no vitals taken for this visit.  Physical Exam:  Constitutional: Patient appears well-developed HEENT:  Head: Normocephalic Eyes:EOM are normal Neck: Normal range of motion Cardiovascular: Normal rate Pulmonary/chest: Effort normal Neurologic: Patient is alert Skin: Skin is warm Psychiatric: Patient has normal mood and affect  Ortho Exam: Ortho exam demonstrates range of motion of the right shoulder of 70/100/160.  He does have pain with forward flexion and abduction starting around 70 degrees.  Subscap strength  5- out of 5 on the right 5+ out of 5 on the left.  External rotation strength otherwise intact.  Well-healed deltopectoral incision seen on that right shoulder with functional deltoid muscle.  No Popeye deformity present.  Cervical spine range of motion has reasonable flexion extension and rotation with 5 out of 5 grip EPL FPL interosseous are/extension bicep triceps and deltoid strength bilaterally with no definite paresthesias C5-T1 and symmetric reflexes biceps triceps.  Specialty Comments:  CLINICAL DATA:  Neck trauma, dangerous injury mechanism   EXAM: MRI CERVICAL SPINE WITHOUT CONTRAST   TECHNIQUE: Multiplanar, multisequence MR imaging of the cervical spine was performed. No intravenous contrast was administered.   COMPARISON:  09/14/2021 CT cervical spine, no prior MRI of the cervical spine.   FINDINGS: Alignment: No significant antero or retrolisthesis.   Vertebrae: No acute fracture or suspicious osseous lesion. Endplate degenerative changes most prominently at C5-C6.   Cord: Normal signal and morphology. No evidence of significant canal hematoma. The hyperdensity noted on the prior CT correlates with T2 hypointense material along the right aspect of the thecal sac at C7-T1 (series 8, image 24), which is favored to represent ossification of the ligamentum flavum or hypertrophy of the facets, but could possibly represent a small amount of hemorrhage; this does not cause significant mass effect on the thecal sac or spinal canal stenosis.   Posterior Fossa, vertebral arteries, paraspinal tissues: Negative.   Disc levels:   C2-C3: No significant disc bulge. Left-greater-than-right facet and uncovertebral hypertrophy. No spinal canal stenosis or neuroforaminal narrowing.   C3-C4: Mild disc bulge. Left-greater-than-right facet and uncovertebral hypertrophy. No spinal canal stenosis. Mild left neural foraminal narrowing.   C4-C5: No significant disc bulge. No spinal canal  stenosis or neural foraminal narrowing.   C5-C6: Disc height loss and mild disc bulge. Facet and uncovertebral hypertrophy. No spinal canal stenosis. Moderate to severe right and moderate left neural foraminal narrowing.   C6-C7: No significant disc bulge. Facet and uncovertebral hypertrophy. No spinal canal stenosis. Mild bilateral neural foraminal narrowing.   C7-T1: No significant disc bulge. Facet and uncovertebral hypertrophy. No spinal canal stenosis. No neural foraminal narrowing. A small synovial cyst is seen extending from the medial aspect of the left facets, which also does not cause spinal canal stenosis.   IMPRESSION: 1. The hyperdensity noted on the cervical spine CT at C7-T1 correlates with a T2 hypointense focus, which is nonspecific and may represent ossification of the ligamentum flavum, sequela of facet arthropathy, or a small amount of hemorrhage. This does not cause significant spinal canal stenosis or mass effect on the thecal sac. 2. No spinal canal stenosis. Multilevel uncovertebral and facet arthropathy causes up to moderate to severe neural foraminal narrowing at C5-C6 on the right.     Electronically Signed   By: Wiliam Ke M.D.   On: 09/14/2021 23:10  Imaging: No results found.   PMFS History: Patient Active Problem List   Diagnosis Date Noted  Lumbar radiculopathy 05/30/2023   Major depressive disorder, recurrent severe without psychotic features (HCC) 01/28/2023   S/P lumbar fusion 08/23/2022   Postlaminectomy syndrome, not elsewhere classified 08/12/2022   MDD (major depressive disorder) 08/10/2022   Substance induced mood disorder (HCC) 08/06/2022   Suicide attempt by drug overdose (HCC) 08/06/2022   Toxic encephalopathy 08/03/2022   Suicide ideation 08/03/2022   Encephalopathy 08/03/2022   Polysubstance abuse (HCC) 04/28/2022   Postoperative abdominal pain 02/28/2022   Acute cholecystitis 02/17/2022   Obstruction of nasal valve  01/07/2022   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   Pulmonary nodule 12/17/2021   CAD S/P percutaneous coronary angioplasty 12/17/2021   Epigastric abdominal pain    Nausea and vomiting    Tobacco dependence due to cigarettes 08/31/2021   Iron deficiency anemia secondary to inadequate dietary iron intake 08/31/2021   Acute lead-induced gout involving toe of left foot 08/31/2021   Encounter for lipid screening for cardiovascular disease 08/31/2021   Hypotension due to drugs 06/21/2021   Opioid withdrawal (HCC) 06/21/2021   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HCC) 06/21/2021   RLS (restless legs syndrome) 05/07/2021   Acquired deviated nasal septum 05/03/2021   Encounter for surveillance of abnormal nevi 05/03/2021   Flu vaccine need 05/03/2021   Substance abuse (HCC) 05/03/2021   Drug abuse and dependence (HCC) 05/03/2021   Need for shingles vaccine 05/03/2021   Right groin pain 01/29/2021   S/P TKR (total knee replacement) using cement, right 01/19/2021   Cigarette smoker 07/21/2020   Foot pain, bilateral 12/25/2019   Potential exposure to STD 12/12/2019   History of lumbar surgery 12/12/2019   Fatigue 12/12/2019   Penile lesion 12/12/2019   Right testicular pain 12/12/2019   Body mass index 28.0-28.9, adult 12/12/2019   History of COPD 12/12/2019   Tobacco use disorder, continuous 12/12/2019   Hepatoma (HCC) 11/05/2019   Anxiety 12/10/2018   Essential hypertension 09/18/2018   Hypercholesteremia 09/18/2018   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   Chronic anticoagulation (Plavix) 04/19/2018   Pain medication agreement broken ([facility]) 04/19/2018   Cocaine use 04/18/2018   Cocaine abuse (HCC) (See 04/12/18 UDS) 04/18/2018   Marijuana abuse (See 04/12/18 UDS) 04/18/2018   Long term current use of opiate analgesic 04/12/2018   Cirrhosis of liver (HCC) 04/03/2018   BPH (benign prostatic hyperplasia) 04/03/2018   Hematuria, microscopic 04/03/2018   Osteoarthritis of the knee (Left) 11/02/2017   Lumbar  facet syndrome (Bilateral) 11/01/2017   Spondylosis without myelopathy or radiculopathy, lumbosacral region 11/01/2017   Osteoarthritis of shoulder (Right) 11/01/2017   Osteoarthritis of acromioclavicular joint (Right) 11/01/2017   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) 11/01/2017   Osteoarthritis 11/01/2017   Cervicalgia 11/01/2017   Cervical facet syndrome 11/01/2017   DDD (degenerative disc disease), cervical 11/01/2017   DDD (degenerative disc disease), lumbar 11/01/2017   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) 09/18/2017   Other chronic pain 09/18/2017   Chronic musculoskeletal pain 09/18/2017   Chronic pain of right knee 09/11/2017   Chronic low back pain (Secondary Area of Pain) (Bilateral) 09/11/2017   Chronic hip pain (Tertiary Area of Pain) (Bilateral) (R>L) 09/11/2017   Chronic shoulder pain (Fourth Area of Pain) (Right) 09/11/2017   Spondylosis without myelopathy or radiculopathy, cervical region 09/11/2017   Chronic pain syndrome 09/11/2017   Opiate use 09/11/2017   Screen for STD (sexually transmitted disease) 09/11/2017   Disorder of skeletal system 09/11/2017   Problems influencing health status 09/11/2017   Overdose of medication, intentional self-harm, sequela (HCC) 12/28/2016   Severe  recurrent major depression without psychotic features (HCC) 12/27/2016   Alcohol use disorder, moderate, dependence (HCC) 12/27/2016   Cannabis use disorder, moderate, dependence (HCC) 12/27/2016   Chest pain 06/10/2016   Bipolar 1 disorder (HCC) 05/27/2013   Myalgia 05/27/2013   Abnormal ejaculation 11/30/2012   Chest pain, unspecified 11/30/2012   Degenerative arthritis of hip 11/16/2012   Schizoaffective disorder (HCC) 06/27/2012   COPD (chronic obstructive pulmonary disease) (HCC) 06/27/2012   Chronic hepatitis C (HCC) 06/27/2012   GERD (gastroesophageal reflux disease) 06/27/2012   Complete rupture of rotator cuff 02/10/2012   Cocaine abuse in remission (HCC)  12/05/2011   Unspecified osteoarthritis, unspecified site 07/27/2011   Past Medical History:  Diagnosis Date   Anginal pain (HCC)    Anxiety    Arthritis    Bipolar 1 disorder (HCC)    Bursitis    CAD (coronary artery disease)    Chronic pain    COPD (chronic obstructive pulmonary disease) (HCC)    Current use of long term anticoagulation    DAPT (ASA + clopidogrel)   Depression    Diverticulitis    Dyspnea    GERD (gastroesophageal reflux disease)    Grade I diastolic dysfunction    Hepatitis C 2012   No longer has Hep C   HLD (hyperlipidemia)    Hypertension    MI (myocardial infarction) (HCC)    Polysubstance abuse (HCC)    cocaine, marijuana, ETOH   PUD (peptic ulcer disease)    S/P angioplasty with stent 06/10/2016   a.) 90% stenosis of pLAD to mLAD - 2.5 x 18 mm Xience Alpine (DES x 1) placed to pLAD   S/P PTCA (percutaneous transluminal coronary angioplasty) 12/04/2019   a.) 60% in stent restenosis of DES to pLAD; LVEF 65%.   Schizophrenia (HCC)    Stroke [facility])    Valvular insufficiency    a.) Mild MR, TR, PR; mild to moderate AR on 03/05/2018 TTE    Family History  Problem Relation Age of Onset   Osteoarthritis Mother    Heart disease Mother    Hypertension Mother    Depression Mother    Heart disease Father    Early death Father    Hypertension Father    Heart attack Father    Hypertension Sister    Parkinson's disease Maternal Grandfather    Prostate cancer Neg Hx    Bladder Cancer Neg Hx    Kidney cancer Neg Hx    Tremor Neg Hx     Past Surgical History:  Procedure Laterality Date   ABDOMINAL SURGERY     removed small piece of intestines due to [facility] Diverticulosis   ANTERIOR LAT LUMBAR FUSION N/A 08/23/2022   Procedure: DIRECT LATERAL INTERBODY FUSION  LUMBAR TWO- LUMBAR THREE, LUMBAR THREE-LUMBAR FOUR , EXPLORE AND EXTEND FUSION LUMBAR TWO-LUMBAR FIVE, POSTERIOR DECOMPRESSION LUMBAR THREE-LUMBAR FOUR, LEFT LUMBAR TWO-LUMBAR THREE;  Surgeon:  Bedelia Person, MD;  Location: [facility] OR;  Service: Neurosurgery;  Laterality: N/A;   APPENDECTOMY     BACK SURGERY     CARDIAC CATHETERIZATION Left 06/10/2016   Procedure: Left Heart Cath and Coronary Angiography;  Surgeon: Laurier Nancy, MD;  Location: [facility] INVASIVE CV LAB;  Service: Cardiovascular;  Laterality: Left;   CARDIAC CATHETERIZATION N/A 06/10/2016   Procedure: Coronary Stent Intervention;  Surgeon: Alwyn Pea, MD;  Location: [facility] INVASIVE CV LAB;  Service: Cardiovascular;  Laterality: N/A;   CHOLECYSTECTOMY N/A 02/17/2022   Procedure: LAPAROSCOPIC CHOLECYSTECTOMY;  Surgeon: Feliciana Rossetti  Clifton Custard, MD;  Location: [facility] OR;  Service: General;  Laterality: N/A;   COLON SURGERY     COLONOSCOPY     COLONOSCOPY WITH PROPOFOL N/A 01/05/2017   Procedure: COLONOSCOPY WITH PROPOFOL;  Surgeon: Wyline Mood, MD;  Location: [facility] ENDOSCOPY;  Service: Endoscopy;  Laterality: N/A;   COLONOSCOPY WITH PROPOFOL N/A 02/13/2020   Procedure: COLONOSCOPY WITH PROPOFOL;  Surgeon: Pasty Spillers, MD;  Location: [facility] ENDOSCOPY;  Service: Endoscopy;  Laterality: N/A;   CORONARY ANGIOPLASTY WITH STENT PLACEMENT     CORONARY PRESSURE/FFR STUDY N/A 12/04/2019   Procedure: INTRAVASCULAR PRESSURE WIRE/FFR STUDY;  Surgeon: Tonny Bollman, MD;  Location: [facility] INVASIVE CV LAB;  Service: Cardiovascular;  Laterality: N/A;   ESOPHAGOGASTRODUODENOSCOPY (EGD) WITH PROPOFOL N/A 01/05/2017   Procedure: ESOPHAGOGASTRODUODENOSCOPY (EGD) WITH PROPOFOL;  Surgeon: Wyline Mood, MD;  Location: [facility] ENDOSCOPY;  Service: Endoscopy;  Laterality: N/A;   ESOPHAGOGASTRODUODENOSCOPY (EGD) WITH PROPOFOL N/A 02/13/2020   Procedure: ESOPHAGOGASTRODUODENOSCOPY (EGD) WITH PROPOFOL;  Surgeon: Pasty Spillers, MD;  Location: [facility] ENDOSCOPY;  Service: Endoscopy;  Laterality: N/A;   KNEE ARTHROSCOPY WITH MEDIAL MENISECTOMY Right 09/05/2017   Procedure: KNEE ARTHROSCOPY WITH MEDIAL AND LATERAL  MENISECTOMY PARTIAL SYNOVECTOMY;   Surgeon: Kennedy Bucker, MD;  Location: [facility] ORS;  Service: Orthopedics;  Laterality: Right;   LEFT HEART CATH AND CORONARY ANGIOGRAPHY N/A 12/04/2019   Procedure: LEFT HEART CATH AND CORONARY ANGIOGRAPHY;  Surgeon: Tonny Bollman, MD;  Location: [facility] INVASIVE CV LAB;  Service: Cardiovascular;  Laterality: N/A;   LEFT HEART CATH AND CORONARY ANGIOGRAPHY Left 11/25/2022   Procedure: LEFT HEART CATH AND CORONARY ANGIOGRAPHY;  Surgeon: Laurier Nancy, MD;  Location: [facility] INVASIVE CV LAB;  Service: Cardiovascular;  Laterality: Left;   SHOULDER SURGERY Right 04/09/2012   SPINE SURGERY     TOTAL KNEE ARTHROPLASTY Right 01/19/2021   Procedure: TOTAL KNEE ARTHROPLASTY - [place] Neighbor to Assist;  Surgeon: Kennedy Bucker, MD;  Location: [facility] ORS;  Service: Orthopedics;  Laterality: Right;   Social History   Occupational History   Occupation: disability   Occupation: stocking at gas station    Comment: 2 days per week  Tobacco Use   Smoking status: Some Days    Current packs/day: 0.00    Average packs/day: 0.5 packs/day for 32.0 years (16.0 ttl pk-yrs)    Types: Cigarettes    Start date: 10/25/1989    Last attempt to quit: 10/25/2021    Years since quitting: 1.6    Passive exposure: Current   Smokeless tobacco: Never   Tobacco comments:    Continues to smoke approximately 5 cigarettes per day.  Has been 2 days since las cigarette.  Has not bought any more.  Using patches and gum.  5/23 hfb  Vaping Use   Vaping status: Never Used  Substance and Sexual Activity   Alcohol use: Not Currently    Alcohol/week: 3.0 standard drinks of alcohol    Types: 3 Cans of beer per week    Comment: occassionally    Drug use: Not Currently    Types: Cocaine, Marijuana    Comment: last on saturday   Sexual activity: Not Currently    Partners: Female    Birth control/protection: None

## 2023-06-20 NOTE — Progress Notes (Signed)
Patient ID: KAMORI OREBAUGH, male    DOB: 1960/11/04  MRN: [id number]  CC: Follow-Up  Subjective: Keith Gibbs is a 62 y.o. male who presents for follow-up.   His concerns today include:  - Acid reflux. Denies red flag symptoms. States caused by the medications he is taking.  - Established with Pulmonology for chronic conditions. Needs refills on Albuterol inhaler and Breztri. Will complete courtesy refill today in office.  - Established with Orthopedics.   Patient Active Problem List   Diagnosis Date Noted   Lumbar radiculopathy 05/30/2023   Major depressive disorder, recurrent severe without psychotic features (HCC) 01/28/2023   S/P lumbar fusion 08/23/2022   Postlaminectomy syndrome, not elsewhere classified 08/12/2022   MDD (major depressive disorder) 08/10/2022   Substance induced mood disorder (HCC) 08/06/2022   Suicide attempt by drug overdose (HCC) 08/06/2022   Toxic encephalopathy 08/03/2022   Suicide ideation 08/03/2022   Encephalopathy 08/03/2022   Polysubstance abuse (HCC) 04/28/2022   Postoperative abdominal pain 02/28/2022   Acute cholecystitis 02/17/2022   Obstruction of nasal valve 01/07/2022   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   Pulmonary nodule 12/17/2021   CAD S/P percutaneous coronary angioplasty 12/17/2021   Epigastric abdominal pain    Nausea and vomiting    Tobacco dependence due to cigarettes 08/31/2021   Iron deficiency anemia secondary to inadequate dietary iron intake 08/31/2021   Acute lead-induced gout involving toe of left foot 08/31/2021   Encounter for lipid screening for cardiovascular disease 08/31/2021   Hypotension due to drugs 06/21/2021   Opioid withdrawal (HCC) 06/21/2021   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HCC) 06/21/2021   RLS (restless legs syndrome) 05/07/2021   Acquired deviated nasal septum 05/03/2021   Encounter for surveillance of abnormal nevi 05/03/2021   Flu vaccine need 05/03/2021   Substance abuse (HCC) 05/03/2021   Drug abuse and dependence  (HCC) 05/03/2021   Need for shingles vaccine 05/03/2021   Right groin pain 01/29/2021   S/P TKR (total knee replacement) using cement, right 01/19/2021   Cigarette smoker 07/21/2020   Foot pain, bilateral 12/25/2019   Potential exposure to STD 12/12/2019   History of lumbar surgery 12/12/2019   Fatigue 12/12/2019   Penile lesion 12/12/2019   Right testicular pain 12/12/2019   Body mass index 28.0-28.9, adult 12/12/2019   History of COPD 12/12/2019   Tobacco use disorder, continuous 12/12/2019   Hepatoma (HCC) 11/05/2019   Anxiety 12/10/2018   Essential hypertension 09/18/2018   Hypercholesteremia 09/18/2018   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   Chronic anticoagulation (Plavix) 04/19/2018   Pain medication agreement broken ([facility]) 04/19/2018   Cocaine use 04/18/2018   Cocaine abuse (HCC) (See 04/12/18 UDS) 04/18/2018   Marijuana abuse (See 04/12/18 UDS) 04/18/2018   Long term current use of opiate analgesic 04/12/2018   Cirrhosis of liver (HCC) 04/03/2018   BPH (benign prostatic hyperplasia) 04/03/2018   Hematuria, microscopic 04/03/2018   Osteoarthritis of the knee (Left) 11/02/2017   Lumbar facet syndrome (Bilateral) 11/01/2017   Spondylosis without myelopathy or radiculopathy, lumbosacral region 11/01/2017   Osteoarthritis of shoulder (Right) 11/01/2017   Osteoarthritis of acromioclavicular joint (Right) 11/01/2017   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) 11/01/2017   Osteoarthritis 11/01/2017   Cervicalgia 11/01/2017   Cervical facet syndrome 11/01/2017   DDD (degenerative disc disease), cervical 11/01/2017   DDD (degenerative disc disease), lumbar 11/01/2017   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) 09/18/2017   Other chronic pain 09/18/2017   Chronic musculoskeletal pain 09/18/2017   Chronic pain of right knee  09/11/2017   Chronic low back pain (Secondary Area of Pain) (Bilateral) 09/11/2017   Chronic hip pain [facility] Area of Pain) (Bilateral)  (R>L) 09/11/2017   Chronic shoulder pain (Fourth Area of Pain) (Right) 09/11/2017   Spondylosis without myelopathy or radiculopathy, cervical region 09/11/2017   Chronic pain syndrome 09/11/2017   Opiate use 09/11/2017   Screen for STD (sexually transmitted disease) 09/11/2017   Disorder of skeletal system 09/11/2017   Problems influencing health status 09/11/2017   Overdose of medication, intentional self-harm, sequela (HCC) 12/28/2016   Severe recurrent major depression without psychotic features (HCC) 12/27/2016   Alcohol use disorder, moderate, dependence (HCC) 12/27/2016   Cannabis use disorder, moderate, dependence (HCC) 12/27/2016   Chest pain 06/10/2016   Bipolar 1 disorder (HCC) 05/27/2013   Myalgia 05/27/2013   Abnormal ejaculation 11/30/2012   Chest pain, unspecified 11/30/2012   Degenerative arthritis of hip 11/16/2012   Schizoaffective disorder (HCC) 06/27/2012   COPD (chronic obstructive pulmonary disease) (HCC) 06/27/2012   Chronic hepatitis C (HCC) 06/27/2012   GERD (gastroesophageal reflux disease) 06/27/2012   Complete rupture of rotator cuff 02/10/2012   Cocaine abuse in remission (HCC) 12/05/2011   Unspecified osteoarthritis, unspecified site 07/27/2011     Current Outpatient Medications on File Prior to Visit  Medication Sig Dispense Refill   albuterol (VENTOLIN HFA) 108 (90 Base) MCG/ACT inhaler Inhale 2 puffs into the lungs every 4 (four) hours as needed for wheezing or shortness of breath.     aspirin EC 81 MG tablet Take 1 tablet (81 mg total) by mouth daily. Swallow whole. 30 tablet 0   atorvastatin (LIPITOR) 80 MG tablet Take 1 tablet (80 mg total) by mouth daily. 30 tablet 0   azithromycin (ZITHROMAX) 250 MG tablet Take 1 tablet (250 mg total) by mouth daily. Take first 2 tablets together, then 1 every day until finished. 6 tablet 0   Buprenorphine HCl-Naloxone HCl 8-2 MG FILM Place 1 Film under the tongue 2 (two) times daily.     cyclobenzaprine  (FLEXERIL) 10 MG tablet Take 1 tablet (10 mg total) by mouth 3 (three) times daily as needed for muscle spasms. 50 tablet 0   diclofenac Sodium (VOLTAREN) 1 % GEL Apply 2 g topically 4 (four) times daily. 2 g 0   Diclofenac Sodium 3 % GEL Apply 1 Application topically 2 (two) times daily.     divalproex (DEPAKOTE) 250 MG DR tablet Take 250 mg by mouth 2 (two) times daily.     divalproex (DEPAKOTE) 500 MG DR tablet Take 1 tablet in the morning and 2 tablets at bedtime 90 tablet 1   DULoxetine (CYMBALTA) 30 MG capsule Take 3 capsules (90 mg total) by mouth daily. 90 capsule 1   gabapentin (NEURONTIN) 600 MG tablet Take 1 tablet (600 mg total) by mouth daily AND 2 tablets (1,200 mg total) at bedtime. 90 tablet 0   hydrOXYzine (ATARAX) 25 MG tablet Take 1 tablet (25 mg total) by mouth 2 (two) times daily as needed for anxiety. 60 tablet 1   ibuprofen (ADVIL) 800 MG tablet Take 800 mg by mouth every 8 (eight) hours as needed.     nicotine (NICODERM CQ - DOSED IN MG/24 HOURS) 14 mg/24hr patch Place 14 mg onto the skin as needed.     nitroGLYCERIN (NITROSTAT) 0.4 MG SL tablet Place 1 tablet (0.4 mg total) under the tongue every 5 (five) minutes as needed for chest pain. 30 tablet 0   oxyCODONE (OXY IR/ROXICODONE) 5 MG immediate  release tablet Take 5 mg by mouth every 4 (four) hours as needed for breakthrough pain.     QUEtiapine (SEROQUEL) 100 MG tablet Take 1 tablet (100 mg total) by mouth at bedtime. 30 tablet 1   risperiDONE (RISPERDAL) 0.5 MG tablet Take 1 tablet (0.5 mg total) by mouth daily. 30 tablet 0   traMADol (ULTRAM) 50 MG tablet Take 1 tablet (50 mg total) by mouth every 12 (twelve) hours as needed. Do not take while operating a motor vehicle 30 tablet 0   traMADol (ULTRAM) 50 MG tablet Take 1 tablet (50 mg total) by mouth every 12 (twelve) hours as needed. 30 tablet 0   traZODone (DESYREL) 100 MG tablet Take 1 tablet (100 mg total) by mouth at bedtime as needed. for sleep 30 tablet 1    traZODone (DESYREL) 100 MG tablet Take 100 mg by mouth at bedtime.     triamcinolone cream (KENALOG) 0.1 % Apply 1 Application topically 2 (two) times daily. 30 g 0   No current facility-administered medications on file prior to visit.    Allergies  Allergen Reactions   Asenapine Other (See Comments) and Nausea And Vomiting    Increased tremors   Dextromethorphan Hbr    Guaifenesin    Latuda [Lurasidone Hcl] Other (See Comments)    Tremors     Lurasidone Other (See Comments) and Hives    Other Reaction(s): Angioedema   Phenylephrine     Social History   Socioeconomic History   Marital status: Widowed    Spouse name: Not on file   Number of children: Not on file   Years of education: Not on file   Highest education level: Not on file  Occupational History   Occupation: disability   Occupation: stocking at gas station    Comment: 2 days per week  Tobacco Use   Smoking status: Some Days    Current packs/day: 0.00    Average packs/day: 0.5 packs/day for 32.0 years (16.0 ttl pk-yrs)    Types: Cigarettes    Start date: 10/25/1989    Last attempt to quit: 10/25/2021    Years since quitting: 1.6    Passive exposure: Current   Smokeless tobacco: Never   Tobacco comments:    Continues to smoke approximately 5 cigarettes per day.  Has been 2 days since las cigarette.  Has not bought any more.  Using patches and gum.  5/23 hfb  Vaping Use   Vaping status: Never Used  Substance and Sexual Activity   Alcohol use: Not Currently    Alcohol/week: 3.0 standard drinks of alcohol    Types: 3 Cans of beer per week    Comment: occassionally    Drug use: Not Currently    Types: Cocaine, Marijuana    Comment: last on saturday   Sexual activity: Not Currently    Partners: Female    Birth control/protection: None  Other Topics Concern   Not on file  Social History Narrative   Not on file   Social Determinants of Health   Financial Resource Strain: Not on file  Food Insecurity: No  Food Insecurity (01/29/2023)   Hunger Vital Sign    Worried About Running Out of Food in the Last Year: Never true    Ran Out of Food in the Last Year: Never true  Transportation Needs: No Transportation Needs (01/29/2023)   PRAPARE - Administrator, Civil Service (Medical): No    Lack of Transportation (Non-Medical): No  Physical  Activity: Not on file  Stress: Not on file  Social Connections: Not on file  Intimate Partner Violence: Not At Risk (01/29/2023)   Humiliation, Afraid, Rape, and Kick questionnaire    Fear of Current or Ex-Partner: No    Emotionally Abused: No    Physically Abused: No    Sexually Abused: No    Family History  Problem Relation Age of Onset   Osteoarthritis Mother    Heart disease Mother    Hypertension Mother    Depression Mother    Heart disease Father    Early death Father    Hypertension Father    Heart attack Father    Hypertension Sister    Parkinson's disease Maternal Grandfather    Prostate cancer Neg Hx    Bladder Cancer Neg Hx    Kidney cancer Neg Hx    Tremor Neg Hx     Past Surgical History:  Procedure Laterality Date   ABDOMINAL SURGERY     removed small piece of intestines due to [facility] Diverticulosis   ANTERIOR LAT LUMBAR FUSION N/A 08/23/2022   Procedure: DIRECT LATERAL INTERBODY FUSION  LUMBAR TWO- LUMBAR THREE, LUMBAR THREE-LUMBAR FOUR , EXPLORE AND EXTEND FUSION LUMBAR TWO-LUMBAR FIVE, POSTERIOR DECOMPRESSION LUMBAR THREE-LUMBAR FOUR, LEFT LUMBAR TWO-LUMBAR THREE;  Surgeon: Bedelia Person, MD;  Location: [facility] OR;  Service: Neurosurgery;  Laterality: N/A;   APPENDECTOMY     BACK SURGERY     CARDIAC CATHETERIZATION Left 06/10/2016   Procedure: Left Heart Cath and Coronary Angiography;  Surgeon: Laurier Nancy, MD;  Location: [facility] INVASIVE CV LAB;  Service: Cardiovascular;  Laterality: Left;   CARDIAC CATHETERIZATION N/A 06/10/2016   Procedure: Coronary Stent Intervention;  Surgeon: Alwyn Pea, MD;  Location: [facility]  INVASIVE CV LAB;  Service: Cardiovascular;  Laterality: N/A;   CHOLECYSTECTOMY N/A 02/17/2022   Procedure: LAPAROSCOPIC CHOLECYSTECTOMY;  Surgeon: Sheliah Hatch De Blanch, MD;  Location: [facility] OR;  Service: General;  Laterality: N/A;   COLON SURGERY     COLONOSCOPY     COLONOSCOPY WITH PROPOFOL N/A 01/05/2017   Procedure: COLONOSCOPY WITH PROPOFOL;  Surgeon: Wyline Mood, MD;  Location: [facility] ENDOSCOPY;  Service: Endoscopy;  Laterality: N/A;   COLONOSCOPY WITH PROPOFOL N/A 02/13/2020   Procedure: COLONOSCOPY WITH PROPOFOL;  Surgeon: Pasty Spillers, MD;  Location: [facility] ENDOSCOPY;  Service: Endoscopy;  Laterality: N/A;   CORONARY ANGIOPLASTY WITH STENT PLACEMENT     CORONARY PRESSURE/FFR STUDY N/A 12/04/2019   Procedure: INTRAVASCULAR PRESSURE WIRE/FFR STUDY;  Surgeon: Tonny Bollman, MD;  Location: [facility] INVASIVE CV LAB;  Service: Cardiovascular;  Laterality: N/A;   ESOPHAGOGASTRODUODENOSCOPY (EGD) WITH PROPOFOL N/A 01/05/2017   Procedure: ESOPHAGOGASTRODUODENOSCOPY (EGD) WITH PROPOFOL;  Surgeon: Wyline Mood, MD;  Location: [facility] ENDOSCOPY;  Service: Endoscopy;  Laterality: N/A;   ESOPHAGOGASTRODUODENOSCOPY (EGD) WITH PROPOFOL N/A 02/13/2020   Procedure: ESOPHAGOGASTRODUODENOSCOPY (EGD) WITH PROPOFOL;  Surgeon: Pasty Spillers, MD;  Location: [facility] ENDOSCOPY;  Service: Endoscopy;  Laterality: N/A;   KNEE ARTHROSCOPY WITH MEDIAL MENISECTOMY Right 09/05/2017   Procedure: KNEE ARTHROSCOPY WITH MEDIAL AND LATERAL  MENISECTOMY PARTIAL SYNOVECTOMY;  Surgeon: Kennedy Bucker, MD;  Location: [facility] ORS;  Service: Orthopedics;  Laterality: Right;   LEFT HEART CATH AND CORONARY ANGIOGRAPHY N/A 12/04/2019   Procedure: LEFT HEART CATH AND CORONARY ANGIOGRAPHY;  Surgeon: Tonny Bollman, MD;  Location: [facility] INVASIVE CV LAB;  Service: Cardiovascular;  Laterality: N/A;   LEFT HEART CATH AND CORONARY ANGIOGRAPHY Left 11/25/2022   Procedure: LEFT HEART CATH AND CORONARY ANGIOGRAPHY;  Surgeon: Welton Flakes,  Kerman Passey, MD;   Location: [facility] INVASIVE CV LAB;  Service: Cardiovascular;  Laterality: Left;   SHOULDER SURGERY Right 04/09/2012   SPINE SURGERY     TOTAL KNEE ARTHROPLASTY Right 01/19/2021   Procedure: TOTAL KNEE ARTHROPLASTY - [place] Neighbor to Assist;  Surgeon: Kennedy Bucker, MD;  Location: [facility] ORS;  Service: Orthopedics;  Laterality: Right;    ROS: Review of Systems Negative except as stated above  PHYSICAL EXAM: BP 109/70   Pulse 60   Temp (!) 97.5 F (36.4 C) (Oral)   Ht 6\' 3"  (1.905 m)   Wt 231 lb 3.2 oz (104.9 kg)   SpO2 96%   BMI 28.90 kg/m   Physical Exam HENT:     Head: Normocephalic and atraumatic.     Nose: Nose normal.     Mouth/Throat:     Mouth: Mucous membranes are moist.     Pharynx: Oropharynx is clear.  Eyes:     Extraocular Movements: Extraocular movements intact.     Conjunctiva/sclera: Conjunctivae normal.     Pupils: Pupils are equal, round, and reactive to light.  Cardiovascular:     Rate and Rhythm: Normal rate and regular rhythm.     Pulses: Normal pulses.     Heart sounds: Normal heart sounds.  Pulmonary:     Effort: Pulmonary effort is normal.     Breath sounds: Normal breath sounds.  Musculoskeletal:        General: Normal range of motion.     Cervical back: Normal range of motion and neck supple.  Neurological:     General: No focal deficit present.     Mental Status: He is alert and oriented to person, place, and time.  Psychiatric:        Mood and Affect: Mood normal.        Behavior: Behavior normal.     ASSESSMENT AND PLAN: 1. Chronic obstructive pulmonary disease, unspecified COPD type (HCC) 2. Panlobular emphysema (HCC) - Continue Albuterol inhaler and Budeson-Glycopyrrol-Formoterol inhaler as prescribed. Counseled on medication adherence/adverse effects/. - Keep all scheduled appointments with Pulmonology.  - albuterol (VENTOLIN HFA) 108 (90 Base) MCG/ACT inhaler; Inhale 2 puffs into the lungs every 6 (six) hours as needed for wheezing or  shortness of breath.  Dispense: 18 g; Refill: 2 - Budeson-Glycopyrrol-Formoterol (BREZTRI AEROSPHERE) 160-9-4.8 MCG/ACT AERO; Inhale 2 puffs into the lungs in the morning and at bedtime.  Dispense: 1 each; Refill: 2  3. Gastroesophageal reflux disease, unspecified whether esophagitis present - Pantoprazole as prescribed. Counseled on medication adherence/adverse effects.  - Follow-up with primary provider as scheduled. - pantoprazole (PROTONIX) 40 MG tablet; Take 1 tablet (40 mg total) by mouth daily.  Dispense: 90 tablet; Refill: 0    Patient was given the opportunity to ask questions.  Patient verbalized understanding of the plan and was able to repeat key elements of the plan. Patient was given clear instructions to go to Emergency Department or return to medical center if symptoms don't improve, worsen, or new problems develop.The patient verbalized understanding.   No orders of the defined types were placed in this encounter.    Requested Prescriptions   Signed Prescriptions Disp Refills   Budeson-Glycopyrrol-Formoterol (BREZTRI AEROSPHERE) 160-9-4.8 MCG/ACT AERO 1 each 2    Sig: Inhale 2 puffs into the lungs in the morning and at bedtime.   albuterol (VENTOLIN HFA) 108 (90 Base) MCG/ACT inhaler 18 g 2    Sig: Inhale 2 puffs into the lungs every 6 (six) hours as needed  for wheezing or shortness of breath.   pantoprazole (PROTONIX) 40 MG tablet 90 tablet 0    Sig: Take 1 tablet (40 mg total) by mouth daily.    No follow-ups on file.  Rema Fendt, NP

## 2023-06-22 ENCOUNTER — Other Ambulatory Visit (HOSPITAL_COMMUNITY): Payer: Self-pay | Admitting: Psychiatry

## 2023-06-22 DIAGNOSIS — F250 Schizoaffective disorder, bipolar type: Secondary | ICD-10-CM

## 2023-06-28 ENCOUNTER — Other Ambulatory Visit (INDEPENDENT_AMBULATORY_CARE_PROVIDER_SITE_OTHER): Payer: MEDICAID | Admitting: *Deleted

## 2023-06-28 ENCOUNTER — Other Ambulatory Visit (HOSPITAL_COMMUNITY): Payer: Self-pay | Admitting: Student in an Organized Health Care Education/Training Program

## 2023-06-28 DIAGNOSIS — Z79899 Other long term (current) drug therapy: Secondary | ICD-10-CM

## 2023-06-28 NOTE — Progress Notes (Signed)
Patient arrived for lab draw. Blood collected in Right arm first attempt without issue or complaint.

## 2023-07-03 ENCOUNTER — Other Ambulatory Visit: Payer: Self-pay | Admitting: Orthopedic Surgery

## 2023-07-03 ENCOUNTER — Encounter: Payer: Self-pay | Admitting: Orthopedic Surgery

## 2023-07-03 ENCOUNTER — Telehealth: Payer: Self-pay | Admitting: Orthopedic Surgery

## 2023-07-03 NOTE — Telephone Encounter (Signed)
Pt called requesting for a refill of Tramadol. Pt asking for double up amount to take. Please send to pharmacy on file. Pt phone number is [phone].

## 2023-07-04 ENCOUNTER — Ambulatory Visit
Admission: RE | Admit: 2023-07-04 | Discharge: 2023-07-04 | Disposition: A | Discharge status: Home / Self Care | Payer: MEDICAID | Source: Ambulatory Visit | Attending: Orthopedic Surgery | Admitting: Orthopedic Surgery

## 2023-07-04 ENCOUNTER — Telehealth: Payer: Self-pay | Admitting: Orthopedic Surgery

## 2023-07-04 DIAGNOSIS — M25511 Pain in right shoulder: Secondary | ICD-10-CM

## 2023-07-04 NOTE — Telephone Encounter (Signed)
Sent in

## 2023-07-04 NOTE — Telephone Encounter (Signed)
Pt called in 2nd notification he would like a refill on Tramadol and possible higher dosage if possible

## 2023-07-07 ENCOUNTER — Encounter (HOSPITAL_COMMUNITY): Payer: Self-pay | Admitting: Student in an Organized Health Care Education/Training Program

## 2023-07-07 ENCOUNTER — Ambulatory Visit (INDEPENDENT_AMBULATORY_CARE_PROVIDER_SITE_OTHER): Payer: MEDICAID | Admitting: Student in an Organized Health Care Education/Training Program

## 2023-07-07 ENCOUNTER — Telehealth (HOSPITAL_COMMUNITY): Payer: Self-pay

## 2023-07-07 VITALS — BP 125/66 | HR 67 | Wt 235.0 lb

## 2023-07-07 DIAGNOSIS — F1011 Alcohol abuse, in remission: Secondary | ICD-10-CM | POA: Diagnosis not present

## 2023-07-07 DIAGNOSIS — F1111 Opioid abuse, in remission: Secondary | ICD-10-CM

## 2023-07-07 DIAGNOSIS — F1411 Cocaine abuse, in remission: Secondary | ICD-10-CM

## 2023-07-07 DIAGNOSIS — Z5181 Encounter for therapeutic drug level monitoring: Secondary | ICD-10-CM

## 2023-07-07 DIAGNOSIS — F4310 Post-traumatic stress disorder, unspecified: Secondary | ICD-10-CM

## 2023-07-07 DIAGNOSIS — F250 Schizoaffective disorder, bipolar type: Secondary | ICD-10-CM | POA: Diagnosis not present

## 2023-07-07 MED ORDER — DIVALPROEX SODIUM 500 MG PO DR TAB: DELAYED_RELEASE_TABLET | ORAL | 2 refills | Status: DC

## 2023-07-07 MED ORDER — GABAPENTIN 600 MG PO TABS: ORAL_TABLET | ORAL | 2 refills | Status: DC

## 2023-07-07 MED ORDER — TRAZODONE HCL 100 MG PO TABS: 100.0000 mg | ORAL_TABLET | Freq: Every day | ORAL | 2 refills | Status: DC

## 2023-07-07 MED ORDER — DULOXETINE HCL 30 MG PO CPEP: 90.0000 mg | ORAL_CAPSULE | Freq: Every day | ORAL | 2 refills | Status: DC

## 2023-07-07 MED ORDER — QUETIAPINE FUMARATE 100 MG PO TABS: 100.0000 mg | ORAL_TABLET | Freq: Every day | ORAL | 2 refills | Status: DC

## 2023-07-07 MED ORDER — HYDROXYZINE HCL 25 MG PO TABS: 25.0000 mg | ORAL_TABLET | Freq: Two times a day (BID) | ORAL | 2 refills | Status: DC | PRN

## 2023-07-07 NOTE — Telephone Encounter (Signed)
Medication management - Called [facility] with Dr. Morrie Sheldon to obtain pt's lab results from 06/28/23 orders. Informed that when the lab tubs came into [facility] they did not get the requisition so now the labs would need to be redrawn. Dr. Meda Klinefelter informed.
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
## 2023-07-12 ENCOUNTER — Other Ambulatory Visit (HOSPITAL_COMMUNITY): Payer: MEDICAID

## 2023-07-12 DIAGNOSIS — Z5181 Encounter for therapeutic drug level monitoring: Secondary | ICD-10-CM

## 2023-07-12 NOTE — Progress Notes (Signed)
Pt tolerated labs well.

## 2023-07-14 ENCOUNTER — Telehealth (HOSPITAL_COMMUNITY): Payer: Self-pay | Admitting: *Deleted

## 2023-07-14 LAB — COMPREHENSIVE METABOLIC PANEL
ALT: 9 [IU]/L (ref 0–44)
AST: 12 [IU]/L (ref 0–40)
Albumin: 3.9 g/dL (ref 3.9–4.9)
Alkaline Phosphatase: 66 [IU]/L (ref 44–121)
BUN/Creatinine Ratio: 13 (ref 10–24)
BUN: 12 mg/dL (ref 8–27)
Bilirubin Total: 0.3 mg/dL (ref 0.0–1.2)
CO2: 19 mmol/L — ABNORMAL LOW (ref 20–29)
Calcium: 9.3 mg/dL (ref 8.6–10.2)
Chloride: 106 mmol/L (ref 96–106)
Creatinine, Ser: 0.95 mg/dL (ref 0.76–1.27)
Globulin, Total: 2.6 g/dL (ref 1.5–4.5)
Glucose: 122 mg/dL — ABNORMAL HIGH (ref 70–99)
Potassium: 4.2 mmol/L (ref 3.5–5.2)
Sodium: 141 mmol/L (ref 134–144)
Total Protein: 6.5 g/dL (ref 6.0–8.5)
eGFR: 90 mL/min/{1.73_m2} (ref >59)

## 2023-07-14 NOTE — Telephone Encounter (Signed)
Fax received asking for a 90 day prescription from [facility] for Hydro[facility]ine.

## 2023-07-15 NOTE — Telephone Encounter (Signed)
He had a refill sent on 12/13.

## 2023-07-17 NOTE — Progress Notes (Signed)
Blue sheet done.  Thanks

## 2023-07-21 ENCOUNTER — Other Ambulatory Visit: Payer: Self-pay | Admitting: Orthopedic Surgery

## 2023-07-21 ENCOUNTER — Telehealth: Payer: Self-pay | Admitting: Orthopedic Surgery

## 2023-07-21 MED ORDER — TRAMADOL HCL 50 MG PO TABS: 50.0000 mg | ORAL_TABLET | Freq: Four times a day (QID) | ORAL | 0 refills | Status: DC | PRN

## 2023-07-21 NOTE — Telephone Encounter (Signed)
Pt requesting Tramadol [facility]ill sent [facility] on [facility]

## 2023-07-21 NOTE — Telephone Encounter (Signed)
Sent and blue sheet done

## 2023-07-23 IMAGING — CR DG CHEST 2V
3 series · 3 of 3 positions shown · non-contrast
Comparison: Chest x-ray 07/23/2020

CLINICAL DATA: Shortness of breath

EXAM:
CHEST - 2 VIEW

[chest lat]
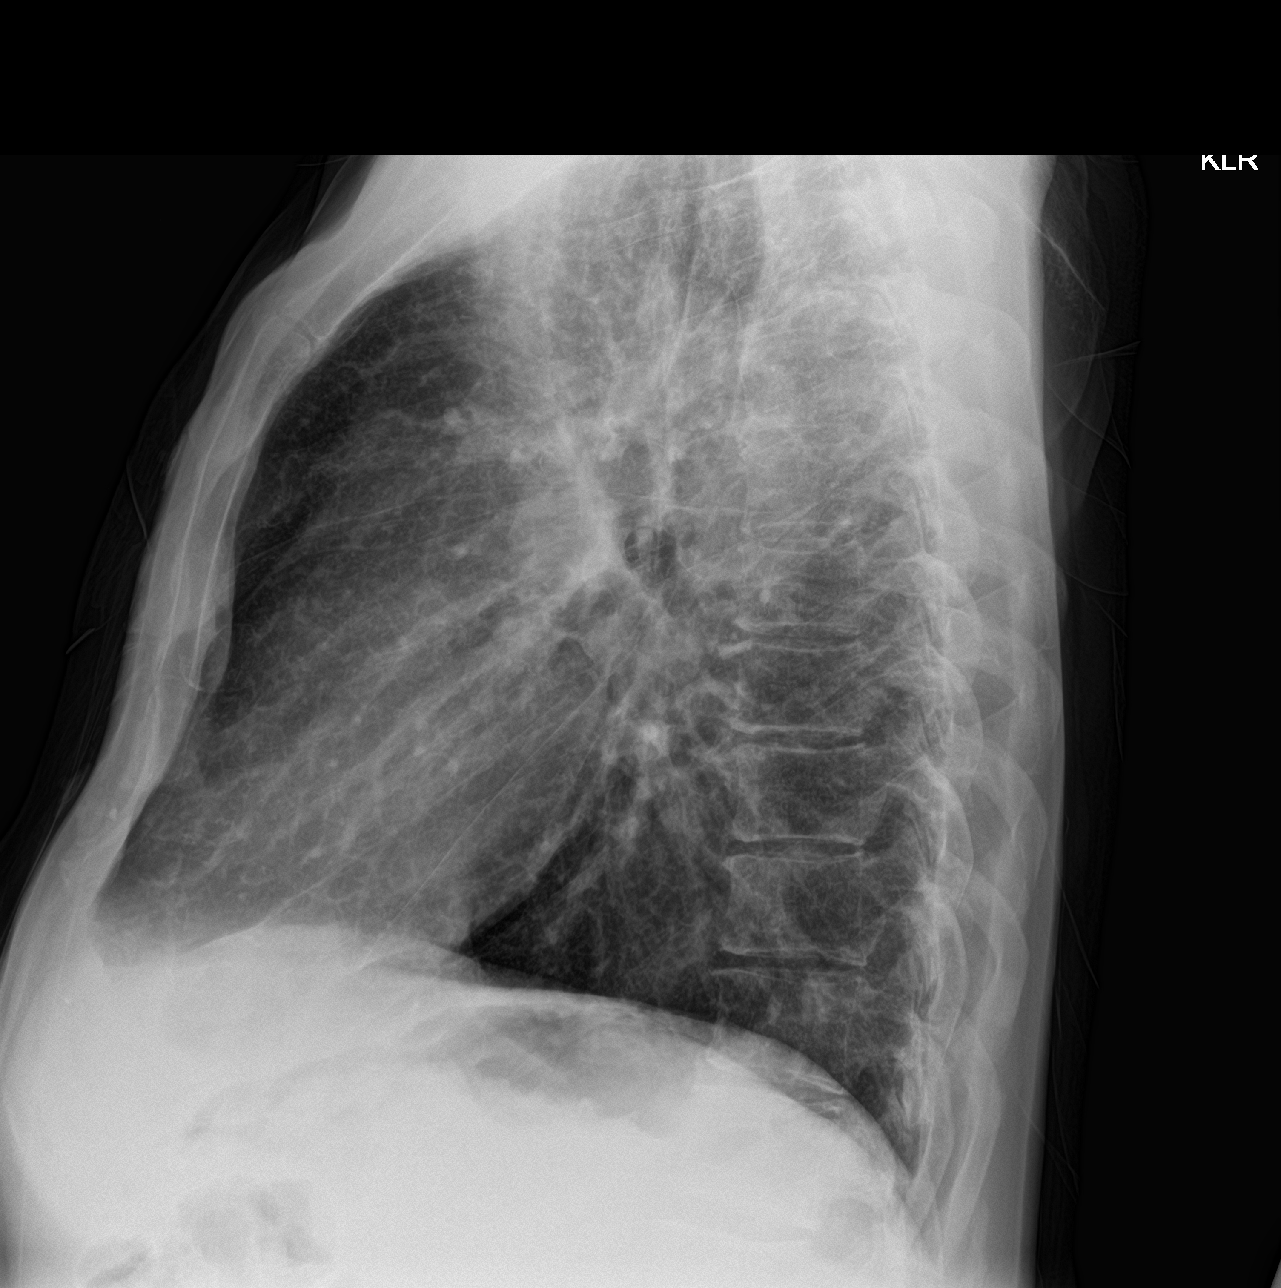

[chest ap (1 of 2)]
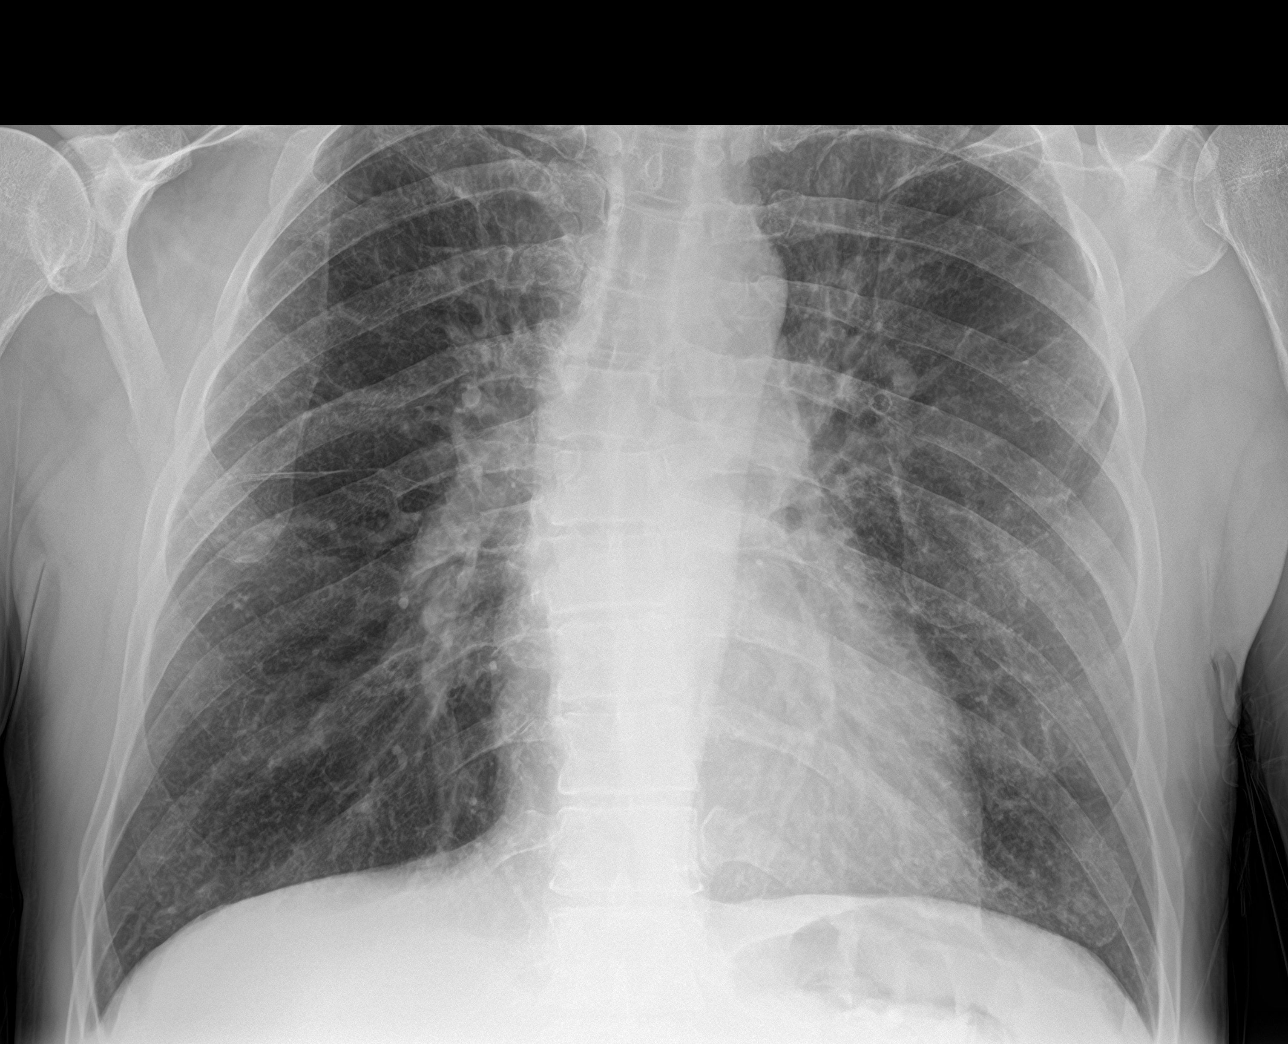

[chest ap (2 of 2)]
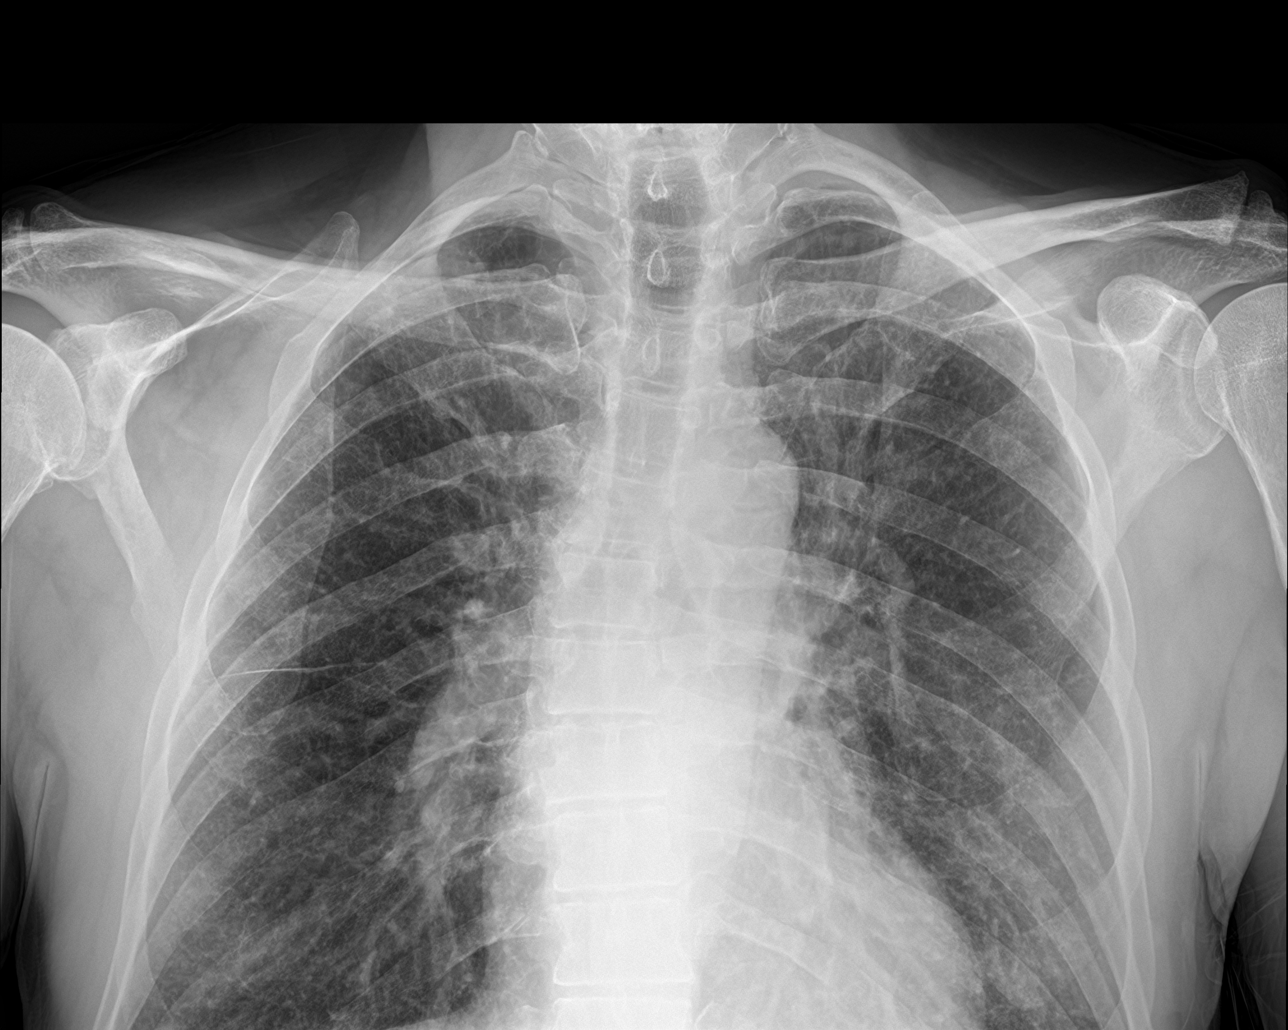

[3 of 3 positions shown; findings below may reference images not displayed]

FINDINGS: Heart size is normal. Mediastinum is stable. Lungs are hyperinflated
with chronic mildly prominent interstitial lung markings. No focal
consolidation identified. No pleural effusion or pneumothorax.
IMPRESSION: Emphysematous changes with no acute process identified.

## 2023-08-01 LAB — COMPREHENSIVE METABOLIC PANEL
ALT: 6 [IU]/L (ref 0–44)
AST: 14 [IU]/L (ref 0–40)
Albumin: 3.8 g/dL — ABNORMAL LOW (ref 3.9–4.9)
Alkaline Phosphatase: 66 [IU]/L (ref 44–121)
BUN/Creatinine Ratio: 11 (ref 10–24)
BUN: 10 mg/dL (ref 8–27)
Bilirubin Total: <0.2 mg/dL (ref 0.0–1.2)
Calcium: 8.9 mg/dL (ref 8.6–10.2)
Chloride: 105 mmol/L (ref 96–106)
Creatinine, Ser: 0.93 mg/dL (ref 0.76–1.27)
Globulin, Total: 2.2 g/dL (ref 1.5–4.5)
Sodium: 138 mmol/L (ref 134–144)
Total Protein: 6 g/dL (ref 6.0–8.5)
eGFR: 93 mL/min/{1.73_m2} (ref >59)

## 2023-08-01 LAB — VALPROIC ACID LEVEL: Valproic Acid Lvl: 51 ug/mL (ref 50–100)

## 2023-08-01 LAB — SPECIMEN STATUS REPORT

## 2023-08-07 ENCOUNTER — Telehealth: Payer: Self-pay | Admitting: Orthopedic Surgery

## 2023-08-07 ENCOUNTER — Other Ambulatory Visit: Payer: Self-pay | Admitting: Surgical

## 2023-08-07 MED ORDER — TRAMADOL HCL 50 MG PO TABS: 50.0000 mg | ORAL_TABLET | Freq: Every evening | ORAL | 0 refills | Status: DC | PRN

## 2023-08-07 NOTE — Telephone Encounter (Signed)
 Pt called requesting a refill of tramadol. [facility] [address] Pt phone number is [phone]. Pt asked for a call when meds has been sent

## 2023-08-07 NOTE — Telephone Encounter (Signed)
 refilled

## 2023-08-14 ENCOUNTER — Telehealth: Payer: Self-pay

## 2023-08-14 ENCOUNTER — Telehealth: Payer: Self-pay | Admitting: Orthopedic Surgery

## 2023-08-14 NOTE — Telephone Encounter (Signed)
Surgery Berkley Harvey is still pending with [facility] and they are requesting additional clinical:  1) PT or OT or home exercise >= 6 wks 2) activity modification >= 6wks  Can you please dictate a note addressing these so I can upload on their web site

## 2023-08-14 NOTE — Telephone Encounter (Signed)
Pt called requesting a call to see if he still ned appt for 1/22. Please call pt at [phone] if appt is not needed and cancel it

## 2023-08-14 NOTE — Telephone Encounter (Signed)
In discussing with Keith Gibbs his shoulder in the last clinic visit he did describe having to modify all of his activities in order to minimize the pain in the right shoulder.  This has not been successful and even marginally reducing his pain.  He does have pain with ADLs particularly with overhead activity.  He has also tried home exercises which he remembers from his initial index surgery years ago for his right shoulder and they have been too painful to perform.  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  Both of these interventions he has tried for well over 6 weeks prior to his appointment in December.  They were not successful in alleviating pain.  He was hoping for an easy solution to this problem but that unfortunately will not be the case.

## 2023-08-16 ENCOUNTER — Ambulatory Visit: Payer: MEDICAID | Admitting: Orthopedic Surgery

## 2023-08-18 NOTE — Telephone Encounter (Signed)
no

## 2023-08-23 NOTE — Progress Notes (Addendum)
Surgical Instructions   Your procedure is scheduled on Thursday August 31, 2023. Report to [facility] Main Entrance "A" at 9:00 A.M., then check in with the Admitting office. Any questions or running late day of surgery: call [phone]  Questions prior to your surgery date: call [phone], Monday-Friday, 8am-4pm. If you experience any cold or flu symptoms such as cough, fever, chills, shortness of breath, etc. between now and your scheduled surgery, please notify us at the above number.     Remember:  Do not eat after midnight the night before your surgery  You may drink clear liquids until 8:00 the morning of your surgery.   Clear liquids allowed are: Water, Non-Citrus Juices (without pulp), Carbonated Beverages, Clear Tea (no milk, honey, etc.), Black Coffee Only (NO MILK, CREAM OR POWDERED CREAMER of any kind), and Gatorade.  Patient Instructions  The night before surgery:  No food after midnight. ONLY clear liquids after midnight  The day of surgery (if you do NOT have diabetes):  Drink ONE (1) Pre-Surgery Clear Ensure by 8:00 the morning of surgery. Drink in one sitting. Do not sip.  This drink was given to you during your hospital  pre-op appointment visit.  Nothing else to drink after completing the  Pre-Surgery Clear Ensure.        If you have questions, please contact your surgeon's office.    Take these medicines the morning of surgery with A SIP OF WATER  atorvastatin (LIPITOR)  Budeson-Glycopyrrol-Formoterol (BREZTRI AEROSPHERE) 160-9-4.8 MCG/ACT AERO  divalproex (DEPAKOTE)  DULoxetine (CYMBALTA)  gabapentin (NEURONTIN)  pantoprazole (PROTONIX)    May take these medicines IF NEEDED: albuterol (VENTOLIN HFA) 108 (90 Base) MCG/ACT inhaler.  Please bring with you to the hospital.   hydrOXYzine (ATARAX)  nitroGLYCERIN (NITROSTAT) If you have to take this medication prior to surgery, please call [phone] and report this to a nurse   Follow your  surgeon's instructions on when to stop Asprin.  If no instructions were given by your surgeon then you will need to call the office to get those instructions.    One week prior to surgery, STOP taking any Aleve, Naproxen, Ibuprofen, Motrin, Advil, Goody's, BC's, all herbal medications, fish oil, and non-prescription vitamins.  This includes your diclofenac Sodium (VOLTAREN) 1 % GEL.                       Do NOT Smoke (Tobacco/Vaping) for 24 hours prior to your procedure.  If you use a CPAP at night, you may bring your mask/headgear for your overnight stay.   You will be asked to remove any contacts, glasses, piercing's, hearing aid's, dentures/partials prior to surgery. Please bring cases for these items if needed.    Patients discharged the day of surgery will not be allowed to drive home, and someone needs to stay with them for 24 hours.  SURGICAL WAITING ROOM VISITATION Patients may have no more than 2 support people in the waiting area - these visitors may rotate.   Pre-op nurse will coordinate an appropriate time for 1 ADULT support person, who may not rotate, to accompany patient in pre-op.  Children under the age of 32 must have an adult with them who is not the patient and must remain in the main waiting area with an adult.  If the patient needs to stay at the hospital during part of their recovery, the visitor guidelines for inpatient rooms apply.  Please refer to the [facility] website for the  visitor guidelines for any additional information.   If you received a COVID test during your pre-op visit  it is requested that you wear a mask when out in public, stay away from anyone that may not be feeling well and notify your surgeon if you develop symptoms. If you have been in contact with anyone that has tested positive in the last 10 days please notify you surgeon.    Oral Hygiene is also important to reduce your risk of infection.  Remember - BRUSH YOUR TEETH THE MORNING OF  SURGERY WITH YOUR REGULAR TOOTHPASTE  [place]- Preparing for Total Shoulder Arthroplasty  Before surgery, you can play an important role. Because skin is not sterile, your skin needs to be as free of germs as possible. You can reduce the number of germs on your skin by using the following products.   Benzoyl Peroxide Gel  o Reduces the number of germs present on the skin  o Applied twice a day to shoulder area starting two days before surgery   Chlorhexidine Gluconate (CHG) Soap (instructions listed above on how to wash with CHG Soap)  o An antiseptic cleaner that kills germs and bonds with the skin to continue killing germs even after washing  o Used for showering the night before surgery and morning of surgery   ==================================================================  Please follow these instructions carefully:  BENZOYL PEROXIDE 5% GEL  Please do not use if you have an allergy to benzoyl peroxide. If your skin becomes reddened/irritated stop using the benzoyl peroxide.  Starting two days before surgery, apply as follows:  1. Apply benzoyl peroxide in the morning and at night. Apply after taking a shower. If you are not taking a shower clean entire shoulder front, back, and side along with the armpit with a clean wet washcloth.  2. Place a quarter-sized dollop on your SHOULDER and rub in thoroughly, making sure to cover the front, back, and side of your shoulder, along with the armpit.   2 Days prior to Surgery First Dose on _____________ Morning Second Dose on ______________ Night  Day Before Surgery First Dose on ______________ Morning Night before surgery wash (entire body except face and private areas) with CHG Soap THEN Second Dose on ____________ Night   Morning of Surgery  wash BODY AGAIN with CHG Soap   4. Do NOT apply benzoyl peroxide gel on the day of surgery    Pre-operative 5 CHG Bathing Instructions   You can play a key role in  reducing the risk of infection after surgery. Your skin needs to be as free of germs as possible. You can reduce the number of germs on your skin by washing with CHG (chlorhexidine gluconate) soap before surgery. CHG is an antiseptic soap that kills germs and continues to kill germs even after washing.   DO NOT use if you have an allergy to chlorhexidine/CHG or antibacterial soaps. If your skin becomes reddened or irritated, stop using the CHG and notify one of our RNs at [phone].   Please shower with the CHG soap starting 4 days before surgery using the following schedule:     Please keep in mind the following:  DO NOT shave, including legs and underarms, starting the day of your first shower.   You may shave your face at any point before/day of surgery.  Place clean sheets on your bed the day you start using CHG soap. Use a clean washcloth (not used since being washed) for each shower. DO NOT  sleep with pets once you start using the CHG.   CHG Shower Instructions:  Wash your face and private area with normal soap. If you choose to wash your hair, wash first with your normal shampoo.  After you use shampoo/soap, rinse your hair and body thoroughly to remove shampoo/soap residue.  Turn the water OFF and apply about 3 tablespoons (45 ml) of CHG soap to a CLEAN washcloth.  Apply CHG soap ONLY FROM YOUR NECK DOWN TO YOUR TOES (washing for 3-5 minutes)  DO NOT use CHG soap on face, private areas, open wounds, or sores.  Pay special attention to the area where your surgery is being performed.  If you are having back surgery, having someone wash your back for you may be helpful. Wait 2 minutes after CHG soap is applied, then you may rinse off the CHG soap.  Pat dry with a clean towel  Put on clean clothes/pajamas   If you choose to wear lotion, please use ONLY the CHG-compatible lotions that are listed below.  Additional instructions for the day of surgery: DO NOT APPLY any lotions,  deodorants or cologne.    Do not bring valuables to the hospital. [facility] is not responsible for any belongings/valuables. Do not wear jewelry or makeup Put on clean/comfortable clothes.  Please brush your teeth.  Ask your nurse before applying any prescription medications to the skin.     CHG Compatible Lotions   Aveeno Moisturizing lotion  Cetaphil Moisturizing Cream  Cetaphil Moisturizing Lotion  Clairol Herbal Essence Moisturizing Lotion, Dry Skin  Clairol Herbal Essence Moisturizing Lotion, Extra Dry Skin  Clairol Herbal Essence Moisturizing Lotion, Normal Skin  Curel Age Defying Therapeutic Moisturizing Lotion with Alpha Hydroxy  Curel Extreme Care Body Lotion  Curel Soothing Hands Moisturizing Hand Lotion  Curel Therapeutic Moisturizing Cream, Fragrance-Free  Curel Therapeutic Moisturizing Lotion, Fragrance-Free  Curel Therapeutic Moisturizing Lotion, Original Formula  Eucerin Daily Replenishing Lotion  Eucerin Dry Skin Therapy Plus Alpha Hydroxy Crme  Eucerin Dry Skin Therapy Plus Alpha Hydroxy Lotion  Eucerin Original Crme  Eucerin Original Lotion  Eucerin Plus Crme Eucerin Plus Lotion  Eucerin TriLipid Replenishing Lotion  Keri Anti-Bacterial Hand Lotion  Keri Deep Conditioning Original Lotion Dry Skin Formula Softly Scented  Keri Deep Conditioning Original Lotion, Fragrance Free Sensitive Skin Formula  Keri Lotion Fast Absorbing Fragrance Free Sensitive Skin Formula  Keri Lotion Fast Absorbing Softly Scented Dry Skin Formula  Keri Original Lotion  Keri Skin Renewal Lotion Keri Silky Smooth Lotion  Keri Silky Smooth Sensitive Skin Lotion  Nivea Body Creamy Conditioning Oil  Nivea Body Extra Enriched Lotion  Nivea Body Original Lotion  Nivea Body Sheer Moisturizing Lotion Nivea Crme  Nivea Skin Firming Lotion  NutraDerm 30 Skin Lotion  NutraDerm Skin Lotion  NutraDerm Therapeutic Skin Cream  NutraDerm Therapeutic Skin Lotion  ProShield Protective Hand  Cream  Provon moisturizing lotion  Please read over the following fact sheets that you were given.

## 2023-08-24 ENCOUNTER — Encounter (HOSPITAL_COMMUNITY)
Admission: RE | Admit: 2023-08-24 | Discharge: 2023-08-24 | Disposition: A | Discharge status: Home / Self Care | Payer: MEDICAID | Source: Ambulatory Visit | Attending: Orthopedic Surgery | Admitting: Orthopedic Surgery

## 2023-08-24 ENCOUNTER — Other Ambulatory Visit: Payer: Self-pay

## 2023-08-24 ENCOUNTER — Encounter (HOSPITAL_COMMUNITY): Payer: Self-pay

## 2023-08-24 VITALS — BP 134/74 | HR 59 | Temp 98.4°F | Resp 18 | Ht 75.0 in | Wt 232.7 lb

## 2023-08-24 DIAGNOSIS — Z01818 Encounter for other preprocedural examination: Secondary | ICD-10-CM

## 2023-08-24 DIAGNOSIS — Z01812 Encounter for preprocedural laboratory examination: Secondary | ICD-10-CM | POA: Insufficient documentation

## 2023-08-24 LAB — BASIC METABOLIC PANEL
Anion gap: 8 (ref 5–15)
BUN: 10 mg/dL (ref 8–23)
CO2: 25 mmol/L (ref 22–32)
Calcium: 8.9 mg/dL (ref 8.9–10.3)
Chloride: 103 mmol/L (ref 98–111)
Creatinine, Ser: 1 mg/dL (ref 0.61–1.24)
GFR, Estimated: >60 mL/min (ref >60)
Glucose, Bld: 100 mg/dL — ABNORMAL HIGH (ref 70–99)
Potassium: 4.1 mmol/L (ref 3.5–5.1)
Sodium: 136 mmol/L (ref 135–145)

## 2023-08-24 LAB — CBC
HCT: 45.8 % (ref 39.0–52.0)
Hemoglobin: 15.6 g/dL (ref 13.0–17.0)
MCH: 31.7 pg (ref 26.0–34.0)
MCHC: 34.1 g/dL (ref 30.0–36.0)
MCV: 93.1 fL (ref 80.0–100.0)
Platelets: 165 10*3/uL (ref 150–400)
RBC: 4.92 MIL/uL (ref 4.22–5.81)
RDW: 12.8 % (ref 11.5–15.5)
WBC: 7.4 10*3/uL (ref 4.0–10.5)
nRBC: 0 % (ref 0.0–0.2)

## 2023-08-24 LAB — URINALYSIS, W/ REFLEX TO CULTURE (INFECTION SUSPECTED)
Bacteria, UA: NONE SEEN
Bilirubin Urine: NEGATIVE
Glucose, UA: NEGATIVE mg/dL
Ketones, ur: NEGATIVE mg/dL
Leukocytes,Ua: NEGATIVE
Nitrite: NEGATIVE
Protein, ur: NEGATIVE mg/dL
Specific Gravity, Urine: 1.016 (ref 1.005–1.030)
pH: 5 (ref 5.0–8.0)

## 2023-08-24 LAB — SURGICAL PCR SCREEN
MRSA, PCR: NEGATIVE
Staphylococcus aureus: POSITIVE — AB

## 2023-08-24 NOTE — Progress Notes (Signed)
PCP - Ricky Stabs, NP Cardiologist - Dr. Adrian Blackwater  PPM/ICD - denies Device Orders - na Rep Notified - na  Chest x-ray - 05/30/2023 EKG - 05/30/2023 Stress Test - 03/10/2023 ECHO - 03/13/2023 Cardiac Cath - 11/25/2022  Sleep Study - denies CPAP - na  Non-diabetic  Blood Thinner Instructions: denies Aspirin Instructions:Surgeon to notify when to stop  ERAS Protcol - Ensure until 0800  Anesthesia review: Yes. HTN, MI, stroke, CAD, angina, COPD, high cholesterol  Patient denies shortness of breath, fever, cough and chest pain at PAT appointment   All instructions explained to the patient, with a verbal understanding of the material. Patient agrees to go over the instructions while at home for a better understanding. Patient also instructed to self quarantine after being tested for COVID-19. The opportunity to ask questions was provided.

## 2023-08-30 NOTE — Anesthesia Preprocedure Evaluation (Addendum)
 Anesthesia Evaluation  Patient identified by MRN, date of birth, ID band Patient awake    Reviewed: Allergy  & Precautions, NPO status , Patient's Chart, lab work & pertinent test results  Airway Mallampati: I  TM Distance: >3 FB Neck ROM: Full    Dental no notable dental hx. (+) Edentulous Upper, Edentulous Lower   Pulmonary COPD,  COPD inhaler, Current Smoker and Patient abstained from smoking.   Pulmonary exam normal breath sounds clear to auscultation       Cardiovascular hypertension, + angina  + CAD and + Past MI  Normal cardiovascular exam+ Valvular Problems/Murmurs AI  Rhythm:Regular Rate:Normal     Neuro/Psych    Depression Bipolar Disorder Schizophrenia   Neuromuscular disease CVA, No Residual Symptoms    GI/Hepatic PUD,GERD  Medicated and Controlled,,(+)     substance abuse  , Hepatitis -, C  Endo/Other    Renal/GU      Musculoskeletal  (+) Arthritis ,    Abdominal   Peds  Hematology Lab Results      Component                Value               Date                      WBC                      7.4                 08/24/2023                HGB                      15.6                08/24/2023                HCT                      45.8                08/24/2023                MCV                      93.1                08/24/2023                PLT                      165                 08/24/2023              Anesthesia Other Findings All: see list  Reproductive/Obstetrics                             Anesthesia Physical Anesthesia Plan  ASA: 3  Anesthesia Plan: Regional and General   Post-op Pain Management: Regional block* and Minimal or no pain anticipated   Induction: Intravenous  PONV Risk Score and Plan: 3 and Treatment may vary due to age or medical condition, Ondansetron , Dexamethasone  and Midazolam   Airway Management Planned: Oral ETT  Additional  Equipment:  None  Intra-op Plan:   Post-operative Plan: Extubation in OR  Informed Consent:      Dental advisory given  Plan Discussed with: CRNA and Anesthesiologist  Anesthesia Plan Comments: (R ISB  PAT note by Lynwood Hope, PA-C: 63 year old male follows with cardiology for history of CAD (MI, s/p DES mid LAD 06/09/16; PTCA for moderate ISR 12/04/19) , DM, mild aortic regurgitation, mild pulmonary hypertension. Cath 11/25/2022 showed patent LAD stent, LCX/RCA, and LVEF normal. Following the cath, he had a nuclear stress test 03/13/23 which was interpreted by Dr. Fernand as showing, Ischemia in the RCA territory with borderline LV dysfunction, dilated LV. Advise CCTA. However, peak HR during stress documented as 55bpm and peak BP 90/65. He did not have any additional testing. Last seen by cardiologist Dr. Fernand 06/16/2023, no changes made at that time.  Dr. Fernand cleared patient for surgery on 09/18/23 stating patient is low risk and, patient had normal stress test last year. Copy of clearance in media tab.   Given recent abnormal stress test, I reviewed history with anesthesiologist Dr. Jerrye. Advised given recent cath with only minimal disease, ok to proceed in absence of any new cardiopulmonary symptoms. Pt will be evaluated by assigned anesthesiologist on DOS. I did also reach out to Dr. Fernand for clarification of stress test results. He advised pt ok to proceed. Did not elaborate on stress results.   Other pertinent history includes current smoker with associated COPD (maintained on Breztri ), CVA, GERD, hep C s/p treatment, polysubstance abuse, schizophrenia, Polar 1.  Preop labs reviewed, WNL.  EKG 05/30/2023: NSR.  Rate 68.  CTA chest 05/30/2023: IMPRESSION: 1. No evidence of pulmonary embolism. 2. Mild to moderate severity centrilobular and paraseptal emphysematous lung disease. 3. Mild lingular, bilateral upper lobe and posterior bibasilar atelectasis. 4. Small pericardial  effusion. 5. Colonic diverticulosis.  TTE 03/13/2023: ASSESSMENT  Technically adequate study.  Normal chamber sizes.  Normal left ventricular systolic function.  Mild left ventricular hypertrophy with GRADE 1 (relaxation abnormality)  diastolic dysfunction.  Normal right ventricular systolic function.  Normal right ventricular diastolic function.  Normal left ventricular wall motion.  Normal right ventricular wall motion.  Trace tricuspid regurgitation.  Mild pulmonary hypertension.  Trace mitral regurgitation.  Mild aortic regurgitation.  No pericardial effusion.  Mildly dilated left atrium  Mild left ventricle hypertrophy   Nuclear stress 03/08/2023: IMPRESSION: Ischemia in the RCA territory with borderline LV dysfunction, dilated LV. Advise CCTA.   Cath 11/25/2022:   Prox LAD to Mid LAD lesion is 25% stenosed.   The left ventricular systolic function is normal.   The left ventricular ejection fraction is greater than 65% by visual estimate.   There is no mitral valve regurgitation.  Stent LAD no restenosis, LCX/RCA, and LVEF normal, medical treatment.  Nuclear stress 11/01/2022: EKG RESULTS: NSR. 53/min. No significant ST changes with persantine . IMAGE QUALITY: Good PERFUSION/WALL MOTION FINDINGS: EF = 59%. Small mild reversible basal inferoseptal and inferior, mid inferoseptal, apical inferior and apex (17) wall defects, normal wall motion.    IMPRESSION: Equivocal stress test with normal LVEF.   Cath 12/04/2019:  Previously placed Mid LAD drug eluting stent is widely patent.  Balloon angioplasty was performed.  Prox LAD to Mid LAD lesion is 60% stenosed.  The left ventricular systolic function is normal.  LV end diastolic pressure is normal.  The left ventricular ejection fraction is greater than 65% by visual estimate.   1. Single vessel CAD with moderate in-stent restenosis in the LAD, negative  pressure wire evaluation 2. Patent left main, LCx, and RCA  without significant stenosis 3. Normal LV function, LVEF 65%  Recommend: medical therapy, tobacco cessation  )        Anesthesia Quick Evaluation

## 2023-08-31 ENCOUNTER — Ambulatory Visit (HOSPITAL_BASED_OUTPATIENT_CLINIC_OR_DEPARTMENT_OTHER): Payer: MEDICAID | Admitting: Anesthesiology

## 2023-08-31 ENCOUNTER — Encounter (HOSPITAL_COMMUNITY): Payer: Self-pay | Admitting: Orthopedic Surgery

## 2023-08-31 ENCOUNTER — Other Ambulatory Visit: Payer: Self-pay

## 2023-08-31 ENCOUNTER — Observation Stay (HOSPITAL_COMMUNITY): Payer: MEDICAID

## 2023-08-31 ENCOUNTER — Observation Stay (HOSPITAL_COMMUNITY)
Admission: RE | Admit: 2023-08-31 | Discharge: 2023-09-01 | Disposition: A | Discharge status: Home / Self Care | Payer: MEDICAID | Attending: Orthopedic Surgery | Admitting: Orthopedic Surgery

## 2023-08-31 ENCOUNTER — Telehealth: Payer: Self-pay

## 2023-08-31 ENCOUNTER — Encounter (HOSPITAL_COMMUNITY)
Admission: RE | Disposition: A | Discharge status: Home / Self Care | Payer: Self-pay | Source: Home / Self Care | Attending: Orthopedic Surgery

## 2023-08-31 ENCOUNTER — Ambulatory Visit (HOSPITAL_COMMUNITY): Payer: MEDICAID | Admitting: Physician Assistant

## 2023-08-31 DIAGNOSIS — J449 Chronic obstructive pulmonary disease, unspecified: Secondary | ICD-10-CM | POA: Diagnosis not present

## 2023-08-31 DIAGNOSIS — Z96651 Presence of right artificial knee joint: Secondary | ICD-10-CM | POA: Insufficient documentation

## 2023-08-31 DIAGNOSIS — I10 Essential (primary) hypertension: Secondary | ICD-10-CM

## 2023-08-31 DIAGNOSIS — X58XXXA Exposure to other specified factors, initial encounter: Secondary | ICD-10-CM | POA: Insufficient documentation

## 2023-08-31 DIAGNOSIS — Z8673 Personal history of transient ischemic attack (TIA), and cerebral infarction without residual deficits: Secondary | ICD-10-CM | POA: Insufficient documentation

## 2023-08-31 DIAGNOSIS — I2510 Atherosclerotic heart disease of native coronary artery without angina pectoris: Secondary | ICD-10-CM | POA: Insufficient documentation

## 2023-08-31 DIAGNOSIS — F17210 Nicotine dependence, cigarettes, uncomplicated: Secondary | ICD-10-CM | POA: Insufficient documentation

## 2023-08-31 DIAGNOSIS — Z7982 Long term (current) use of aspirin: Secondary | ICD-10-CM | POA: Insufficient documentation

## 2023-08-31 DIAGNOSIS — S46011A Strain of muscle(s) and tendon(s) of the rotator cuff of right shoulder, initial encounter: Secondary | ICD-10-CM | POA: Insufficient documentation

## 2023-08-31 DIAGNOSIS — F4310 Post-traumatic stress disorder, unspecified: Secondary | ICD-10-CM

## 2023-08-31 DIAGNOSIS — F1011 Alcohol abuse, in remission: Secondary | ICD-10-CM

## 2023-08-31 DIAGNOSIS — Z01818 Encounter for other preprocedural examination: Principal | ICD-10-CM

## 2023-08-31 DIAGNOSIS — F1411 Cocaine abuse, in remission: Secondary | ICD-10-CM

## 2023-08-31 DIAGNOSIS — M19011 Primary osteoarthritis, right shoulder: Secondary | ICD-10-CM | POA: Diagnosis present

## 2023-08-31 DIAGNOSIS — Z79899 Other long term (current) drug therapy: Secondary | ICD-10-CM | POA: Diagnosis not present

## 2023-08-31 DIAGNOSIS — I25119 Atherosclerotic heart disease of native coronary artery with unspecified angina pectoris: Secondary | ICD-10-CM | POA: Diagnosis not present

## 2023-08-31 DIAGNOSIS — F250 Schizoaffective disorder, bipolar type: Secondary | ICD-10-CM

## 2023-08-31 DIAGNOSIS — M19019 Primary osteoarthritis, unspecified shoulder: Secondary | ICD-10-CM | POA: Diagnosis present

## 2023-08-31 HISTORY — PX: REVERSE SHOULDER ARTHROPLASTY: SHX5054

## 2023-08-31 SURGERY — ARTHROPLASTY, SHOULDER, TOTAL, REVERSE
Anesthesia: Regional | Site: Shoulder | Laterality: Right

## 2023-08-31 MED ORDER — DIVALPROEX SODIUM 500 MG PO DR TAB
500.0000 mg | DELAYED_RELEASE_TABLET | Freq: Every day | ORAL | Status: DC
  Administered 2023-09-01: 500 mg via ORAL
  Filled 2023-08-31 (×2): qty 1

## 2023-08-31 MED ORDER — BUPIVACAINE HCL (PF) 0.5 % IJ SOLN
INTRAMUSCULAR | Status: DC | PRN
  Administered 2023-08-31: 15 mL via PERINEURAL

## 2023-08-31 MED ORDER — DOCUSATE SODIUM 100 MG PO CAPS
100.0000 mg | ORAL_CAPSULE | Freq: Two times a day (BID) | ORAL | Status: DC
  Administered 2023-08-31 – 2023-09-01 (×2): 100 mg via ORAL
  Filled 2023-08-31 (×2): qty 1

## 2023-08-31 MED ORDER — CEFAZOLIN SODIUM-DEXTROSE 2-4 GM/100ML-% IV SOLN
2.0000 g | INTRAVENOUS | Status: AC
  Administered 2023-08-31: 2 g via INTRAVENOUS

## 2023-08-31 MED ORDER — TRAZODONE HCL 50 MG PO TABS
100.0000 mg | ORAL_TABLET | Freq: Every day | ORAL | Status: DC
  Administered 2023-08-31: 100 mg via ORAL
  Filled 2023-08-31: qty 2

## 2023-08-31 MED ORDER — PHENYLEPHRINE 80 MCG/ML (10ML) SYRINGE FOR IV PUSH (FOR BLOOD PRESSURE SUPPORT)
PREFILLED_SYRINGE | INTRAVENOUS | Status: DC | PRN
  Administered 2023-08-31 (×10): 80 ug via INTRAVENOUS

## 2023-08-31 MED ORDER — ATORVASTATIN CALCIUM 80 MG PO TABS
80.0000 mg | ORAL_TABLET | Freq: Every day | ORAL | Status: DC
  Administered 2023-09-01: 80 mg via ORAL
  Filled 2023-08-31: qty 1

## 2023-08-31 MED ORDER — BUPIVACAINE LIPOSOME 1.3 % IJ SUSP
INTRAMUSCULAR | Status: DC | PRN
  Administered 2023-08-31: 10 mL via PERINEURAL

## 2023-08-31 MED ORDER — ATROPINE SULFATE 0.4 MG/ML IV SOSY
PREFILLED_SYRINGE | INTRAMUSCULAR | Status: DC | PRN
  Administered 2023-08-31 (×3): .1 mg via INTRAVENOUS

## 2023-08-31 MED ORDER — ROCURONIUM BROMIDE 10 MG/ML (PF) SYRINGE
PREFILLED_SYRINGE | INTRAVENOUS | Status: AC
  Filled 2023-08-31: qty 10

## 2023-08-31 MED ORDER — QUETIAPINE FUMARATE 100 MG PO TABS
100.0000 mg | ORAL_TABLET | Freq: Every day | ORAL | Status: DC
Start: 2023-08-31 — End: 2023-09-01
  Administered 2023-08-31: 100 mg via ORAL
  Filled 2023-08-31: qty 1

## 2023-08-31 MED ORDER — VANCOMYCIN HCL 1000 MG IV SOLR
INTRAVENOUS | Status: DC | PRN
  Administered 2023-08-31: 1000 mg

## 2023-08-31 MED ORDER — GLYCOPYRROLATE PF 0.2 MG/ML IJ SOSY
PREFILLED_SYRINGE | INTRAMUSCULAR | Status: AC
  Filled 2023-08-31: qty 1

## 2023-08-31 MED ORDER — SUGAMMADEX SODIUM 200 MG/2ML IV SOLN
INTRAVENOUS | Status: DC | PRN
  Administered 2023-08-31: 200 mg via INTRAVENOUS

## 2023-08-31 MED ORDER — DIVALPROEX SODIUM 500 MG PO DR TAB
500.0000 mg | DELAYED_RELEASE_TABLET | Freq: Three times a day (TID) | ORAL | Status: DC
Start: 2023-08-31 — End: 2023-08-31

## 2023-08-31 MED ORDER — IRRISEPT - 450ML BOTTLE WITH 0.05% CHG IN STERILE WATER, USP 99.95% OPTIME
TOPICAL | Status: DC | PRN
Start: 2023-08-31 — End: 2023-08-31
  Administered 2023-08-31: 450 mL via TOPICAL

## 2023-08-31 MED ORDER — ALBUMIN HUMAN 5 % IV SOLN: INTRAVENOUS | Status: DC | PRN

## 2023-08-31 MED ORDER — HYDROMORPHONE HCL 1 MG/ML IJ SOLN
0.2500 mg | INTRAMUSCULAR | Status: DC | PRN
  Administered 2023-08-31 (×2): 0.25 mg via INTRAVENOUS

## 2023-08-31 MED ORDER — MIDAZOLAM HCL 2 MG/2ML IJ SOLN
INTRAMUSCULAR | Status: AC
  Administered 2023-08-31: 2 mg via INTRAVENOUS
  Filled 2023-08-31: qty 2

## 2023-08-31 MED ORDER — FENTANYL CITRATE (PF) 100 MCG/2ML IJ SOLN: 50.0000 ug | Freq: Once | INTRAMUSCULAR | Status: AC

## 2023-08-31 MED ORDER — MENTHOL 3 MG MT LOZG: 1.0000 | LOZENGE | OROMUCOSAL | Status: DC | PRN

## 2023-08-31 MED ORDER — PHENOL 1.4 % MT LIQD: 1.0000 | OROMUCOSAL | Status: DC | PRN

## 2023-08-31 MED ORDER — KETOROLAC TROMETHAMINE 30 MG/ML IJ SOLN
30.0000 mg | Freq: Once | INTRAMUSCULAR | Status: AC
  Administered 2023-08-31: 30 mg via INTRAVENOUS
  Filled 2023-08-31: qty 1

## 2023-08-31 MED ORDER — CELECOXIB 200 MG PO CAPS
200.0000 mg | ORAL_CAPSULE | Freq: Two times a day (BID) | ORAL | Status: DC
  Administered 2023-08-31 – 2023-09-01 (×2): 200 mg via ORAL
  Filled 2023-08-31 (×2): qty 1

## 2023-08-31 MED ORDER — BUDESON-GLYCOPYRROL-FORMOTEROL 160-9-4.8 MCG/ACT IN AERO
2.0000 | INHALATION_SPRAY | Freq: Two times a day (BID) | RESPIRATORY_TRACT | Status: DC | PRN
Start: 2023-08-31 — End: 2023-08-31

## 2023-08-31 MED ORDER — GABAPENTIN 400 MG PO CAPS
1200.0000 mg | ORAL_CAPSULE | Freq: Every day | ORAL | Status: DC
  Administered 2023-08-31: 1200 mg via ORAL
  Filled 2023-08-31: qty 3

## 2023-08-31 MED ORDER — VANCOMYCIN HCL 1000 MG IV SOLR
INTRAVENOUS | Status: AC
  Filled 2023-08-31: qty 20

## 2023-08-31 MED ORDER — ONDANSETRON HCL 4 MG PO TABS: 4.0000 mg | ORAL_TABLET | Freq: Four times a day (QID) | ORAL | Status: DC | PRN

## 2023-08-31 MED ORDER — DEXAMETHASONE SODIUM PHOSPHATE 10 MG/ML IJ SOLN
INTRAMUSCULAR | Status: AC
  Filled 2023-08-31: qty 1

## 2023-08-31 MED ORDER — LIDOCAINE 2% (20 MG/ML) 5 ML SYRINGE
INTRAMUSCULAR | Status: DC | PRN
  Administered 2023-08-31: 100 mg via INTRAVENOUS

## 2023-08-31 MED ORDER — CHLORHEXIDINE GLUCONATE 0.12 % MT SOLN
OROMUCOSAL | Status: AC
  Administered 2023-08-31: 15 mL via OROMUCOSAL
  Filled 2023-08-31: qty 15

## 2023-08-31 MED ORDER — EPHEDRINE 5 MG/ML INJ
INTRAVENOUS | Status: AC
  Filled 2023-08-31: qty 5

## 2023-08-31 MED ORDER — ONDANSETRON HCL 4 MG/2ML IJ SOLN
INTRAMUSCULAR | Status: DC | PRN
  Administered 2023-08-31: 4 mg via INTRAVENOUS

## 2023-08-31 MED ORDER — EPHEDRINE SULFATE-NACL 50-0.9 MG/10ML-% IV SOSY
PREFILLED_SYRINGE | INTRAVENOUS | Status: DC | PRN
  Administered 2023-08-31 (×12): 5 mg via INTRAVENOUS

## 2023-08-31 MED ORDER — ACETAMINOPHEN 10 MG/ML IV SOLN
INTRAVENOUS | Status: AC
  Filled 2023-08-31: qty 100

## 2023-08-31 MED ORDER — NITROGLYCERIN 0.4 MG SL SUBL: 0.4000 mg | SUBLINGUAL_TABLET | SUBLINGUAL | Status: DC | PRN

## 2023-08-31 MED ORDER — ACETAMINOPHEN 325 MG PO TABS: 325.0000 mg | ORAL_TABLET | Freq: Four times a day (QID) | ORAL | 0 refills | Status: DC | PRN

## 2023-08-31 MED ORDER — ONDANSETRON HCL 4 MG/2ML IJ SOLN
INTRAMUSCULAR | Status: AC
  Filled 2023-08-31: qty 2

## 2023-08-31 MED ORDER — OXYCODONE HCL 5 MG PO TABS: 5.0000 mg | ORAL_TABLET | Freq: Once | ORAL | Status: DC | PRN

## 2023-08-31 MED ORDER — FLUTICASONE FUROATE-VILANTEROL 100-25 MCG/ACT IN AEPB
1.0000 | INHALATION_SPRAY | Freq: Every day | RESPIRATORY_TRACT | Status: DC
  Administered 2023-09-01: 1 via RESPIRATORY_TRACT
  Filled 2023-08-31: qty 28

## 2023-08-31 MED ORDER — ALBUTEROL SULFATE (2.5 MG/3ML) 0.083% IN NEBU: 3.0000 mL | INHALATION_SOLUTION | Freq: Four times a day (QID) | RESPIRATORY_TRACT | Status: DC | PRN

## 2023-08-31 MED ORDER — POVIDONE-IODINE 10 % EX SWAB
2.0000 | Freq: Once | CUTANEOUS | Status: AC
  Administered 2023-08-31: 2 via TOPICAL

## 2023-08-31 MED ORDER — METOCLOPRAMIDE HCL 5 MG/ML IJ SOLN: 5.0000 mg | Freq: Three times a day (TID) | INTRAMUSCULAR | Status: DC | PRN

## 2023-08-31 MED ORDER — PHENYLEPHRINE HCL-NACL 20-0.9 MG/250ML-% IV SOLN
INTRAVENOUS | Status: DC | PRN
  Administered 2023-08-31: 25 ug/min via INTRAVENOUS

## 2023-08-31 MED ORDER — AMISULPRIDE (ANTIEMETIC) 5 MG/2ML IV SOLN: 10.0000 mg | Freq: Once | INTRAVENOUS | Status: DC | PRN

## 2023-08-31 MED ORDER — ORAL CARE MOUTH RINSE
15.0000 mL | Freq: Once | OROMUCOSAL | Status: AC
Start: 2023-08-31 — End: 2023-08-31

## 2023-08-31 MED ORDER — PROPOFOL 10 MG/ML IV BOLUS
INTRAVENOUS | Status: DC | PRN
  Administered 2023-08-31: 130 mg via INTRAVENOUS
  Administered 2023-08-31: 30 mg via INTRAVENOUS

## 2023-08-31 MED ORDER — FENTANYL CITRATE (PF) 250 MCG/5ML IJ SOLN
INTRAMUSCULAR | Status: DC | PRN
  Administered 2023-08-31: 50 ug via INTRAVENOUS

## 2023-08-31 MED ORDER — STERILE WATER FOR IRRIGATION IR SOLN
Status: DC | PRN
  Administered 2023-08-31: 1000 mL

## 2023-08-31 MED ORDER — HYDROMORPHONE HCL 1 MG/ML IJ SOLN
INTRAMUSCULAR | Status: AC
  Filled 2023-08-31: qty 1

## 2023-08-31 MED ORDER — GABAPENTIN 300 MG PO CAPS
600.0000 mg | ORAL_CAPSULE | Freq: Every day | ORAL | Status: DC
  Administered 2023-08-31 – 2023-09-01 (×2): 600 mg via ORAL
  Filled 2023-08-31 (×2): qty 2

## 2023-08-31 MED ORDER — METOCLOPRAMIDE HCL 5 MG PO TABS: 5.0000 mg | ORAL_TABLET | Freq: Three times a day (TID) | ORAL | Status: DC | PRN

## 2023-08-31 MED ORDER — DULOXETINE HCL 30 MG PO CPEP
90.0000 mg | ORAL_CAPSULE | Freq: Every day | ORAL | Status: DC
  Administered 2023-09-01: 90 mg via ORAL
  Filled 2023-08-31: qty 1

## 2023-08-31 MED ORDER — PANTOPRAZOLE SODIUM 40 MG PO TBEC
40.0000 mg | DELAYED_RELEASE_TABLET | Freq: Every day | ORAL | Status: DC
  Administered 2023-09-01: 40 mg via ORAL
  Filled 2023-08-31: qty 1

## 2023-08-31 MED ORDER — CEFAZOLIN SODIUM-DEXTROSE 2-4 GM/100ML-% IV SOLN
INTRAVENOUS | Status: AC
  Filled 2023-08-31: qty 100

## 2023-08-31 MED ORDER — 0.9 % SODIUM CHLORIDE (POUR BTL) OPTIME
TOPICAL | Status: DC | PRN
  Administered 2023-08-31: 1000 mL

## 2023-08-31 MED ORDER — CHLORHEXIDINE GLUCONATE 0.12 % MT SOLN: 15.0000 mL | Freq: Once | OROMUCOSAL | Status: AC

## 2023-08-31 MED ORDER — ONDANSETRON HCL 4 MG/2ML IJ SOLN
4.0000 mg | Freq: Four times a day (QID) | INTRAMUSCULAR | Status: DC | PRN
Start: 2023-08-31 — End: 2023-09-01

## 2023-08-31 MED ORDER — HYDROMORPHONE HCL 1 MG/ML IJ SOLN
0.5000 mg | INTRAMUSCULAR | Status: DC | PRN
  Administered 2023-08-31 – 2023-09-01 (×3): 1 mg via INTRAVENOUS
  Filled 2023-08-31 (×3): qty 1

## 2023-08-31 MED ORDER — ROCURONIUM BROMIDE 10 MG/ML (PF) SYRINGE
PREFILLED_SYRINGE | INTRAVENOUS | Status: DC | PRN
  Administered 2023-08-31: 60 mg via INTRAVENOUS

## 2023-08-31 MED ORDER — LACTATED RINGERS IV SOLN: INTRAVENOUS | Status: DC | PRN

## 2023-08-31 MED ORDER — CEFAZOLIN SODIUM-DEXTROSE 2-4 GM/100ML-% IV SOLN
2.0000 g | Freq: Four times a day (QID) | INTRAVENOUS | Status: AC
  Administered 2023-08-31 (×2): 2 g via INTRAVENOUS
  Filled 2023-08-31 (×2): qty 100

## 2023-08-31 MED ORDER — GLYCOPYRROLATE PF 0.2 MG/ML IJ SOSY
PREFILLED_SYRINGE | INTRAMUSCULAR | Status: DC | PRN
  Administered 2023-08-31: .2 mg via INTRAVENOUS

## 2023-08-31 MED ORDER — ACETAMINOPHEN 325 MG PO TABS: 325.0000 mg | ORAL_TABLET | Freq: Four times a day (QID) | ORAL | Status: DC | PRN

## 2023-08-31 MED ORDER — OXYCODONE HCL 5 MG/5ML PO SOLN: 5.0000 mg | Freq: Once | ORAL | Status: DC | PRN

## 2023-08-31 MED ORDER — DEXAMETHASONE SODIUM PHOSPHATE 10 MG/ML IJ SOLN
INTRAMUSCULAR | Status: DC | PRN
  Administered 2023-08-31: 5 mg via INTRAVENOUS

## 2023-08-31 MED ORDER — PHENYLEPHRINE 80 MCG/ML (10ML) SYRINGE FOR IV PUSH (FOR BLOOD PRESSURE SUPPORT)
PREFILLED_SYRINGE | INTRAVENOUS | Status: AC
  Filled 2023-08-31: qty 10

## 2023-08-31 MED ORDER — OXYCODONE HCL 5 MG PO TABS: 5.0000 mg | ORAL_TABLET | ORAL | 0 refills | Status: DC | PRN

## 2023-08-31 MED ORDER — DOCUSATE SODIUM 100 MG PO CAPS: 100.0000 mg | ORAL_CAPSULE | Freq: Two times a day (BID) | ORAL | 0 refills | Status: DC

## 2023-08-31 MED ORDER — ONDANSETRON HCL 4 MG/2ML IJ SOLN: 4.0000 mg | Freq: Once | INTRAMUSCULAR | Status: DC | PRN

## 2023-08-31 MED ORDER — OXYCODONE HCL 5 MG PO TABS
5.0000 mg | ORAL_TABLET | ORAL | Status: DC | PRN
  Administered 2023-08-31 – 2023-09-01 (×3): 10 mg via ORAL
  Filled 2023-08-31 (×3): qty 2

## 2023-08-31 MED ORDER — DIVALPROEX SODIUM 500 MG PO DR TAB
1000.0000 mg | DELAYED_RELEASE_TABLET | Freq: Every day | ORAL | Status: DC
  Administered 2023-08-31: 1000 mg via ORAL
  Filled 2023-08-31 (×2): qty 2

## 2023-08-31 MED ORDER — FENTANYL CITRATE (PF) 100 MCG/2ML IJ SOLN
INTRAMUSCULAR | Status: AC
  Administered 2023-08-31: 50 ug via INTRAVENOUS
  Filled 2023-08-31: qty 2

## 2023-08-31 MED ORDER — UMECLIDINIUM BROMIDE 62.5 MCG/ACT IN AEPB
1.0000 | INHALATION_SPRAY | Freq: Every day | RESPIRATORY_TRACT | Status: DC
  Administered 2023-09-01: 1 via RESPIRATORY_TRACT
  Filled 2023-08-31: qty 7

## 2023-08-31 MED ORDER — MIDAZOLAM HCL 2 MG/2ML IJ SOLN: 2.0000 mg | Freq: Once | INTRAMUSCULAR | Status: AC

## 2023-08-31 MED ORDER — DULOXETINE HCL 30 MG PO CPEP: 90.0000 mg | ORAL_CAPSULE | Freq: Every day | ORAL | 2 refills | Status: DC

## 2023-08-31 MED ORDER — POVIDONE-IODINE 7.5 % EX SOLN: Freq: Once | CUTANEOUS | Status: DC

## 2023-08-31 MED ORDER — ACETAMINOPHEN 10 MG/ML IV SOLN
1000.0000 mg | Freq: Once | INTRAVENOUS | Status: DC | PRN
  Administered 2023-08-31: 1000 mg via INTRAVENOUS

## 2023-08-31 MED ORDER — FENTANYL CITRATE (PF) 250 MCG/5ML IJ SOLN
INTRAMUSCULAR | Status: AC
  Filled 2023-08-31: qty 5

## 2023-08-31 MED ORDER — LACTATED RINGERS IV SOLN: INTRAVENOUS | Status: DC

## 2023-08-31 MED ORDER — CELECOXIB 200 MG PO CAPS: 200.0000 mg | ORAL_CAPSULE | Freq: Two times a day (BID) | ORAL | 0 refills | Status: DC

## 2023-08-31 MED ORDER — ATROPINE SULFATE 0.4 MG/ML IV SOLN
INTRAVENOUS | Status: AC
  Filled 2023-08-31: qty 1

## 2023-08-31 MED ORDER — ASPIRIN 81 MG PO TBEC
81.0000 mg | DELAYED_RELEASE_TABLET | Freq: Every day | ORAL | Status: DC
  Administered 2023-08-31 – 2023-09-01 (×2): 81 mg via ORAL
  Filled 2023-08-31 (×2): qty 1

## 2023-08-31 MED ORDER — LIDOCAINE 2% (20 MG/ML) 5 ML SYRINGE
INTRAMUSCULAR | Status: AC
  Filled 2023-08-31: qty 5

## 2023-08-31 SURGICAL SUPPLY — 64 items
ALCOHOL 70% 16 OZ (MISCELLANEOUS) ×2 IMPLANT
AUG COMP REV MI TAPER ADAPTER (Joint) ×1 IMPLANT
AUGMENT COMP REV MI TAPR ADPTR (Joint) IMPLANT
BAG COUNTER SPONGE SURGICOUNT (BAG) ×2 IMPLANT
BIT DRILL 2.7 W/STOP DISP (BIT) IMPLANT
BIT DRILL QUICK REL 1/8 2PK SL (BIT) IMPLANT
BIT DRILL TWIST 2.7 (BIT) IMPLANT
BLADE SAW SGTL 13X75X1.27 (BLADE) ×2 IMPLANT
CHLORAPREP W/TINT 26 (MISCELLANEOUS) ×2 IMPLANT
COOLER ICEMAN CLASSIC (MISCELLANEOUS) ×2 IMPLANT
COVER SURGICAL LIGHT HANDLE (MISCELLANEOUS) ×2 IMPLANT
DRAPE INCISE IOBAN 66X45 STRL (DRAPES) ×2 IMPLANT
DRAPE U-SHAPE 47X51 STRL (DRAPES) ×4 IMPLANT
DRSG AQUACEL AG ADV 3.5X10 (GAUZE/BANDAGES/DRESSINGS) ×2 IMPLANT
ELECT BLADE 4.0 EZ CLEAN MEGAD (MISCELLANEOUS) ×1
ELECT REM PT RETURN 9FT ADLT (ELECTROSURGICAL) ×1
ELECTRODE BLDE 4.0 EZ CLN MEGD (MISCELLANEOUS) ×2 IMPLANT
ELECTRODE REM PT RTRN 9FT ADLT (ELECTROSURGICAL) ×2 IMPLANT
GAUZE SPONGE 4X4 12PLY STRL LF (GAUZE/BANDAGES/DRESSINGS) ×2 IMPLANT
GLENOID SPHERE 36+6 (Joint) IMPLANT
GLOVE BIOGEL PI IND STRL 6.5 (GLOVE) ×2 IMPLANT
GLOVE BIOGEL PI IND STRL 8 (GLOVE) ×2 IMPLANT
GLOVE ECLIPSE 6.5 STRL STRAW (GLOVE) ×2 IMPLANT
GLOVE ECLIPSE 8.0 STRL XLNG CF (GLOVE) ×2 IMPLANT
GOWN STRL REUS W/ TWL LRG LVL3 (GOWN DISPOSABLE) ×2 IMPLANT
GOWN STRL REUS W/ TWL XL LVL3 (GOWN DISPOSABLE) ×2 IMPLANT
GUIDE BONE RSA SHLD ROT RT (ORTHOPEDIC DISPOSABLE SUPPLIES) IMPLANT
HYDROGEN PEROXIDE 16OZ (MISCELLANEOUS) ×2 IMPLANT
JET LAVAGE IRRISEPT WOUND (IRRIGATION / IRRIGATOR) ×1
KIT BASIN OR (CUSTOM PROCEDURE TRAY) ×2 IMPLANT
KIT TURNOVER KIT B (KITS) ×2 IMPLANT
LAVAGE JET IRRISEPT WOUND (IRRIGATION / IRRIGATOR) ×2 IMPLANT
MANIFOLD NEPTUNE II (INSTRUMENTS) ×2 IMPLANT
NDL SUT 6 .5 CRC .975X.05 MAYO (NEEDLE) IMPLANT
NS IRRIG 1000ML POUR BTL (IV SOLUTION) ×2 IMPLANT
PACK SHOULDER (CUSTOM PROCEDURE TRAY) ×2 IMPLANT
PAD COLD SHLDR WRAP-ON (PAD) ×2 IMPLANT
PIN THREADED REVERSE (PIN) IMPLANT
REAMER GUIDE BUSHING SURG DISP (MISCELLANEOUS) IMPLANT
REAMER GUIDE W/SCREW AUG (MISCELLANEOUS) IMPLANT
RESTRAINT HEAD UNIVERSAL NS (MISCELLANEOUS) ×2 IMPLANT
RETRIEVER SUT HEWSON (MISCELLANEOUS) ×2 IMPLANT
SCREW BONE STRL 6.5MMX30MM (Screw) IMPLANT
SCREW LOCKING 4.75MMX15MM (Screw) IMPLANT
SCREW LOCKING NS 4.75MMX20MM (Screw) IMPLANT
SCREW LOCKING STRL 4.75X25X3.5 (Screw) IMPLANT
SLING ARM IMMOBILIZER LRG (SOFTGOODS) ×2 IMPLANT
SOL PREP POV-IOD 4OZ 10% (MISCELLANEOUS) ×2 IMPLANT
SPONGE T-LAP 18X18 ~~LOC~~+RFID (SPONGE) ×2 IMPLANT
STEM MINI LONG 14MM 83MM (Stem) IMPLANT
STRIP CLOSURE SKIN 1/2X4 (GAUZE/BANDAGES/DRESSINGS) ×2 IMPLANT
SUCTION TUBE FRAZIER 10FR DISP (SUCTIONS) ×2 IMPLANT
SUT BROADBAND TAPE 2PK 1.5 (SUTURE) IMPLANT
SUT MNCRL AB 3-0 PS2 18 (SUTURE) ×2 IMPLANT
SUT SILK 2 0 TIES 10X30 (SUTURE) ×2 IMPLANT
SUT VIC AB 0 CT1 27XBRD ANBCTR (SUTURE) ×8 IMPLANT
SUT VIC AB 1 CT1 27XBRD ANBCTR (SUTURE) ×4 IMPLANT
SUT VIC AB 1 CT1 36 (SUTURE) IMPLANT
SUT VIC AB 2-0 CT2 27 (SUTURE) ×6 IMPLANT
SUT VICRYL 0 UR6 27IN ABS (SUTURE) ×4 IMPLANT
TOWEL GREEN STERILE (TOWEL DISPOSABLE) ×2 IMPLANT
TRAY HUM REV SHOULDER 36 +3 (Shoulder) IMPLANT
TRAY HUM REV SHOULDER STD +6 (Shoulder) IMPLANT
WATER STERILE IRR 1000ML POUR (IV SOLUTION) ×2 IMPLANT

## 2023-08-31 NOTE — Anesthesia Procedure Notes (Signed)
 Procedure Name: Intubation Date/Time: 08/31/2023 11:44 AM  Performed by: Jolynn Mage, CRNAPre-anesthesia Checklist: Patient identified, Patient being monitored, Timeout performed, Emergency Drugs available and Suction available Patient Re-evaluated:Patient Re-evaluated prior to induction Oxygen Delivery Method: Circle System Utilized Preoxygenation: Pre-oxygenation with 100% oxygen Induction Type: IV induction Ventilation: Mask ventilation without difficulty Laryngoscope Size: Miller and 2 Grade View: Grade I Tube type: Oral Tube size: 7.5 mm Number of attempts: 1 Airway Equipment and Method: Stylet Placement Confirmation: ETT inserted through vocal cords under direct vision, positive ETCO2 and breath sounds checked- equal and bilateral Secured at: 23 cm Tube secured with: Tape Dental Injury: Teeth and Oropharynx as per pre-operative assessment

## 2023-08-31 NOTE — Plan of Care (Signed)

## 2023-08-31 NOTE — Brief Op Note (Signed)
   08/31/2023  3:27 PM  PATIENT:  Keith Gibbs  63 y.o. male  PRE-OPERATIVE DIAGNOSIS:  right shoulder arthritis   POST-OPERATIVE DIAGNOSIS:  right sholuder arthritis  PROCEDURE:  Procedure(s): RIGHT REVERSE SHOULDER ARTHROPLASTY  SURGEON:  Surgeon(s): [name], MD  ASSISTANT: green rnfa  ANESTHESIA:   general  EBL: 150 ml    Total I/O In: 1150 [I.V.:550; IV Piggyback:600] Out: 150 [Blood:150]  BLOOD ADMINISTERED: none  DRAINS: none   LOCAL MEDICATIONS USED:  none  SPECIMEN:  No Specimen  COUNTS:  YES  TOURNIQUET:  * No tourniquets in log *  DICTATION: .Other Dictation: Dictation Number [id number]  PLAN OF CARE: Admit for overnight observation  PATIENT DISPOSITION:  PACU - hemodynamically stable

## 2023-08-31 NOTE — Anesthesia Postprocedure Evaluation (Signed)
 Anesthesia Post Note  Patient: Keith Gibbs  Procedure(s) Performed: RIGHT REVERSE SHOULDER ARTHROPLASTY (Right: Shoulder)     Patient location during evaluation: PACU Anesthesia Type: Regional and General Level of consciousness: awake and alert, oriented and patient cooperative Pain management: pain level controlled Vital Signs Assessment: post-procedure vital signs reviewed and stable Respiratory status: spontaneous breathing, nonlabored ventilation and respiratory function stable Cardiovascular status: blood pressure returned to baseline and stable Postop Assessment: no apparent nausea or vomiting Anesthetic complications: no   No notable events documented.  Last Vitals:  Vitals:   08/31/23 1615 08/31/23 1630  BP: 109/71 107/72  Pulse: (!) 53 (!) 55  Resp: 13 16  Temp:  36.4 C  SpO2: 96% 98%    Last Pain:  Vitals:   08/31/23 1615  PainSc: 0-No pain                 Almarie CHRISTELLA Marchi

## 2023-08-31 NOTE — Transfer of Care (Signed)
 Immediate Anesthesia Transfer of Care Note  Patient: Savior Himebaugh Nuttall  Procedure(s) Performed: RIGHT REVERSE SHOULDER ARTHROPLASTY (Right: Shoulder)  Patient Location: PACU  Anesthesia Type:General and Regional  Level of Consciousness: drowsy  Airway & Oxygen Therapy: Patient Spontanous Breathing and Patient connected to face mask oxygen  Post-op Assessment: Report given to RN and Post -op Vital signs reviewed and stable  Post vital signs: Reviewed and stable  Last Vitals:  Vitals Value Taken Time  BP 108/63 08/31/23 1518  Temp    Pulse 53 08/31/23 1518  Resp 14 08/31/23 1520  SpO2 91 % 08/31/23 1518  Vitals shown include unfiled device data.  Last Pain:  Vitals:   08/31/23 1115  PainSc: 0-No pain         Complications: No notable events documented.

## 2023-08-31 NOTE — Anesthesia Procedure Notes (Addendum)
 Anesthesia Regional Block: Interscalene brachial plexus block   Pre-Anesthetic Checklist: , timeout performed,  Correct Patient, Correct Site, Correct Laterality,  Correct Procedure, Correct Position, site marked,  Risks and benefits discussed,  Surgical consent,  Pre-op evaluation,  At surgeon's request and post-op pain management  Laterality: Upper and Right  Prep: Maximum Sterile Barrier Precautions used, chloraprep       Needles:  Injection technique: Single-shot  Needle Type: Echogenic Needle     Needle Length: 5cm  Needle Gauge: 21     Additional Needles:   Procedures:,,,, ultrasound used (permanent image in chart),,    Narrative:  Start time: 08/31/2023 11:08 AM End time: 08/31/2023 11:13 AM Injection made incrementally with aspirations every 5 mL.  Performed by: Personally  Anesthesiologist: Jefm Garnette LABOR, MD  Additional Notes: Block assessed prior to procedure. Patient tolerated procedure well.

## 2023-08-31 NOTE — H&P (Signed)
 Keith Gibbs is an 63 y.o. male.   Chief Complaint: right shoulder pain HPI: Keith Gibbs is a 63 y.o. male who presents  reporting continued right shoulder pain.  Since he was last seen months ago he  has had an MRI scan which shows severe degenerative glenohumeral joint arthritis along with prior tenodesis of the biceps tendon as well as marked thinning and irregularity of the subscapularis tendon compatible with high-grade partial-thickness articular surface tearing with a point of communication in the glenohumeral joint due to full-thickness partial width tear of the upper margin of the subscap.  This has been previously repaired.  There is also moderate subscap muscular atrophy.   Patient reports pain more than weakness in that right shoulder.  He has had prior subscap repair performed and the anchor tracks are visible near the somewhat tattered subscap attachment onto the lesser tuberosity.  Glenohumeral arthritis is also severe.  Hard for him to pick up a glass of tea.  Has had a cervical spine injection which gave him no help.  Shoulder injection helped for several days.  Pain wakes him from sleep at night.  Tramadol  is moderately helpful for pain.  Patient is a smoker.  His current shoulder pain is forcing him to become left-handed.  Does have a history of prior remote cardiac intervention..    Past Medical History:  Diagnosis Date   Anginal pain (HCC)    Anxiety    Arthritis    Bipolar 1 disorder (HCC)    Bursitis    CAD (coronary artery disease)    Chronic pain    COPD (chronic obstructive pulmonary disease) (HCC)    Current use of long term anticoagulation    DAPT (ASA + clopidogrel )   Depression    Diverticulitis    Dyspnea    GERD (gastroesophageal reflux disease)    Grade I diastolic dysfunction    Hepatitis C 2012   No longer has Hep C   HLD (hyperlipidemia)    Hypertension    MI (myocardial infarction) (HCC)    Polysubstance abuse (HCC)    cocaine, marijuana, ETOH    PUD (peptic ulcer disease)    S/P angioplasty with stent 06/10/2016   a.) 90% stenosis of pLAD to mLAD - 2.5 x 18 mm Xience Alpine (DES x 1) placed to pLAD   S/P PTCA (percutaneous transluminal coronary angioplasty) 12/04/2019   a.) 60% in stent restenosis of DES to pLAD; LVEF 65%.   Schizophrenia (HCC)    Stroke [facility])    Valvular insufficiency    a.) Mild MR, TR, PR; mild to moderate AR on 03/05/2018 TTE    Past Surgical History:  Procedure Laterality Date   ABDOMINAL SURGERY     removed small piece of intestines due to [facility] Diverticulosis   ANTERIOR LAT LUMBAR FUSION N/A 08/23/2022   Procedure: DIRECT LATERAL INTERBODY FUSION  LUMBAR TWO- LUMBAR THREE, LUMBAR THREE-LUMBAR FOUR , EXPLORE AND EXTEND FUSION LUMBAR TWO-LUMBAR FIVE, POSTERIOR DECOMPRESSION LUMBAR THREE-LUMBAR FOUR, LEFT LUMBAR TWO-LUMBAR THREE;  Surgeon: Debby Dorn MATSU, MD;  Location: [facility] OR;  Service: Neurosurgery;  Laterality: N/A;   APPENDECTOMY     BACK SURGERY     CARDIAC CATHETERIZATION Left 06/10/2016   Procedure: Left Heart Cath and Coronary Angiography;  Surgeon: Denyse DELENA Bathe, MD;  Location: [facility] INVASIVE CV LAB;  Service: Cardiovascular;  Laterality: Left;   CARDIAC CATHETERIZATION N/A 06/10/2016   Procedure: Coronary Stent Intervention;  Surgeon: Cara JONETTA Lovelace, MD;  Location: [facility]  INVASIVE CV LAB;  Service: Cardiovascular;  Laterality: N/A;   CHOLECYSTECTOMY N/A 02/17/2022   Procedure: LAPAROSCOPIC CHOLECYSTECTOMY;  Surgeon: Stevie Herlene Righter, MD;  Location: [facility] OR;  Service: General;  Laterality: N/A;   COLON SURGERY     COLONOSCOPY     COLONOSCOPY WITH PROPOFOL  N/A 01/05/2017   Procedure: COLONOSCOPY WITH PROPOFOL ;  Surgeon: Therisa Bi, MD;  Location: [facility] ENDOSCOPY;  Service: Endoscopy;  Laterality: N/A;   COLONOSCOPY WITH PROPOFOL  N/A 02/13/2020   Procedure: COLONOSCOPY WITH PROPOFOL ;  Surgeon: Janalyn Keene NOVAK, MD;  Location: [facility] ENDOSCOPY;  Service: Endoscopy;  Laterality: N/A;    CORONARY ANGIOPLASTY WITH STENT PLACEMENT     CORONARY PRESSURE/FFR STUDY N/A 12/04/2019   Procedure: INTRAVASCULAR PRESSURE WIRE/FFR STUDY;  Surgeon: Wonda Sharper, MD;  Location: [facility] INVASIVE CV LAB;  Service: Cardiovascular;  Laterality: N/A;   ESOPHAGOGASTRODUODENOSCOPY (EGD) WITH PROPOFOL  N/A 01/05/2017   Procedure: ESOPHAGOGASTRODUODENOSCOPY (EGD) WITH PROPOFOL ;  Surgeon: Therisa Bi, MD;  Location: [facility] ENDOSCOPY;  Service: Endoscopy;  Laterality: N/A;   ESOPHAGOGASTRODUODENOSCOPY (EGD) WITH PROPOFOL  N/A 02/13/2020   Procedure: ESOPHAGOGASTRODUODENOSCOPY (EGD) WITH PROPOFOL ;  Surgeon: Janalyn Keene NOVAK, MD;  Location: [facility] ENDOSCOPY;  Service: Endoscopy;  Laterality: N/A;   KNEE ARTHROSCOPY WITH MEDIAL MENISECTOMY Right 09/05/2017   Procedure: KNEE ARTHROSCOPY WITH MEDIAL AND LATERAL  MENISECTOMY PARTIAL SYNOVECTOMY;  Surgeon: Kathlynn Sharper, MD;  Location: [facility] ORS;  Service: Orthopedics;  Laterality: Right;   LEFT HEART CATH AND CORONARY ANGIOGRAPHY N/A 12/04/2019   Procedure: LEFT HEART CATH AND CORONARY ANGIOGRAPHY;  Surgeon: Wonda Sharper, MD;  Location: [facility] INVASIVE CV LAB;  Service: Cardiovascular;  Laterality: N/A;   LEFT HEART CATH AND CORONARY ANGIOGRAPHY Left 11/25/2022   Procedure: LEFT HEART CATH AND CORONARY ANGIOGRAPHY;  Surgeon: Fernand Denyse LABOR, MD;  Location: [facility] INVASIVE CV LAB;  Service: Cardiovascular;  Laterality: Left;   SHOULDER SURGERY Right 04/09/2012   SPINE SURGERY     TOTAL KNEE ARTHROPLASTY Right 01/19/2021   Procedure: TOTAL KNEE ARTHROPLASTY - [place]ford Amber to Assist;  Surgeon: Kathlynn Sharper, MD;  Location: [facility] ORS;  Service: Orthopedics;  Laterality: Right;    Family History  Problem Relation Age of Onset   Osteoarthritis Mother    Heart disease Mother    Hypertension Mother    Depression Mother    Heart disease Father    Early death Father    Hypertension Father    Heart attack Father    Hypertension Sister    Parkinson's disease Maternal  Grandfather    Prostate cancer Neg Hx    Bladder Cancer Neg Hx    Kidney cancer Neg Hx    Tremor Neg Hx    Social History:  reports that he has been smoking cigarettes. He started smoking about 33 years ago. He has a 16 pack-year smoking history. He has been exposed to tobacco smoke. He has never used smokeless tobacco. He reports that he does not currently use alcohol  after a past usage of about 3.0 standard drinks of alcohol  per week. He reports that he does not currently use drugs after having used the following drugs: Cocaine and Marijuana.  Allergies:  Allergies  Allergen Reactions   Asenapine Other (See Comments) and Nausea And Vomiting    Increased tremors   Dextromethorphan Hbr    Guaifenesin     Latuda [Lurasidone Hcl] Other (See Comments)    Tremors     Lurasidone Other (See Comments) and Hives    Other Reaction(s): Angioedema   Phenylephrine   Medications Prior to Admission  Medication Sig Dispense Refill   albuterol  (VENTOLIN  HFA) 108 (90 Base) MCG/ACT inhaler Inhale 2 puffs into the lungs every 6 (six) hours as needed for wheezing or shortness of breath. 18 g 2   aspirin  EC 81 MG tablet Take 1 tablet (81 mg total) by mouth daily. Swallow whole. 30 tablet 0   atorvastatin  (LIPITOR ) 80 MG tablet Take 1 tablet (80 mg total) by mouth daily. 30 tablet 0   Budeson-Glycopyrrol-Formoterol  (BREZTRI  AEROSPHERE) 160-9-4.8 MCG/ACT AERO Inhale 2 puffs into the lungs in the morning and at bedtime. 1 each 2   diclofenac  Sodium (VOLTAREN ) 1 % GEL Apply 2 g topically 4 (four) times daily. (Patient taking differently: Apply 2 g topically 4 (four) times daily as needed (knee and back pain).) 2 g 0   divalproex  (DEPAKOTE ) 500 MG DR tablet Take 1 tablet in the morning and 2 tablets at bedtime 90 tablet 2   DULoxetine  (CYMBALTA ) 30 MG capsule Take 3 capsules (90 mg total) by mouth daily. (Patient taking differently: Take 30 mg by mouth in the morning, at noon, and at bedtime.) 90 capsule 2    gabapentin  (NEURONTIN ) 600 MG tablet Take 1 tablet (600 mg total) by mouth daily AND 2 tablets (1,200 mg total) at bedtime. 90 tablet 2   hydrOXYzine  (ATARAX ) 25 MG tablet Take 1 tablet (25 mg total) by mouth 2 (two) times daily as needed for anxiety. 60 tablet 2   nitroGLYCERIN  (NITROSTAT ) 0.4 MG SL tablet Place 1 tablet (0.4 mg total) under the tongue every 5 (five) minutes as needed for chest pain. 30 tablet 0   pantoprazole  (PROTONIX ) 40 MG tablet Take 1 tablet (40 mg total) by mouth daily. 90 tablet 0   QUEtiapine  (SEROQUEL ) 100 MG tablet Take 1 tablet (100 mg total) by mouth at bedtime. 30 tablet 2   traZODone  (DESYREL ) 100 MG tablet Take 1 tablet (100 mg total) by mouth at bedtime. 30 tablet 2   azithromycin  (ZITHROMAX ) 250 MG tablet Take 1 tablet (250 mg total) by mouth daily. Take first 2 tablets together, then 1 every day until finished. (Patient not taking: Reported on 07/07/2023) 6 tablet 0   cyclobenzaprine  (FLEXERIL ) 10 MG tablet Take 1 tablet (10 mg total) by mouth 3 (three) times daily as needed for muscle spasms. (Patient not taking: Reported on 07/07/2023) 50 tablet 0   traMADol  (ULTRAM ) 50 MG tablet Take 1-2 tablets (50-100 mg total) by mouth at bedtime as needed. (Patient not taking: Reported on 08/22/2023) 30 tablet 0   triamcinolone  cream (KENALOG ) 0.1 % Apply 1 Application topically 2 (two) times daily. (Patient not taking: Reported on 08/22/2023) 30 g 0    No results found for this or any previous visit (from the past 48 hours). No results found.  Review of Systems  Musculoskeletal:  Positive for arthralgias.  All other systems reviewed and are negative.   Blood pressure 115/66, pulse (!) 53, temperature 97.8 F (36.6 C), resp. rate 17, height 6' 3 (1.905 m), weight 105.2 kg, SpO2 95%. Physical Exam Vitals reviewed.  HENT:     Head: Normocephalic.     Nose: Nose normal.     Mouth/Throat:     Mouth: Mucous membranes are moist.  Eyes:     Pupils: Pupils are equal,  round, and reactive to light.  Cardiovascular:     Rate and Rhythm: Normal rate.     Pulses: Normal pulses.  Pulmonary:     Effort: Pulmonary effort is  normal.  Abdominal:     General: Abdomen is flat.  Musculoskeletal:     Cervical back: Normal range of motion.  Skin:    General: Skin is warm.     Capillary Refill: Capillary refill takes less than 2 seconds.  Neurological:     General: No focal deficit present.     Mental Status: He is alert.  Psychiatric:        Mood and Affect: Mood normal.     Ortho exam demonstrates range of motion of the right shoulder of 70/100/160. He does have pain with forward flexion and abduction starting around 70 degrees. Subscap strength 5- out of 5 on the right 5+ out of 5 on the left. External rotation strength otherwise intact. Well-healed deltopectoral incision seen on that right shoulder with functional deltoid muscle. No Popeye deformity present. Cervical spine range of motion has reasonable flexion extension and rotation with 5 out of 5 grip EPL FPL interosseous are/extension bicep triceps and deltoid strength bilaterally with no definite paresthesias C5-T1 and symmetric reflexes biceps triceps.  Assessment/Plan Impression is right shoulder pain with severe glenohumeral joint arthritis and compromised subscap tendon. Tramadol  refilled. We talked a lot today about operative and nonoperative treatment options. I do not think that any type of augmented repair of the subscap would give him pain relief  that he is seeking due to the severity of the glenohumeral arthritis. I also do not think that standard shoulder replacement in this smoker who has had prior subscap repair and has a full-thickness retear of the subscap with atrophy is indicated. His best hope for pain relief  and no significant loss of function would be reverse shoulder replacement. The risk and benefits are discussed with the patient include not limited to infection nerve vessel damage  instability along with incomplete pain relief  and incomplete restoration of function. Extensive nature of the rehabilitative process is also discussed. Did get CT scan pending for patient specific instrumentation to optimize implant position in this young patient. All questions answered.   KANDICE Glendia Hutchinson, MD 08/31/2023, 10:46 AM

## 2023-09-01 ENCOUNTER — Other Ambulatory Visit: Payer: Self-pay

## 2023-09-01 ENCOUNTER — Telehealth: Payer: Self-pay | Admitting: Orthopedic Surgery

## 2023-09-01 ENCOUNTER — Encounter (HOSPITAL_COMMUNITY): Payer: Self-pay | Admitting: Orthopedic Surgery

## 2023-09-01 DIAGNOSIS — M19011 Primary osteoarthritis, right shoulder: Secondary | ICD-10-CM | POA: Diagnosis not present

## 2023-09-01 NOTE — Progress Notes (Signed)
 OT Cancellation Note  Patient Details Name: Keith Gibbs MRN: [id number] DOB: 1960-08-24   Cancelled Treatment:    Reason Eval/Treat Not Completed: Other (comment)- pt asking OT to return after breakfast. Will follow.   Etta NOVAK, OT Acute Rehabilitation Services Office [phone]   Etta GORMAN Hope 09/01/2023, 8:56 AM

## 2023-09-01 NOTE — Progress Notes (Signed)
  Subjective: Patient stable.  Pain reasonably well-controlled this morning.  Block is worn off.   Objective: Vital signs in last 24 hours: Temp:  [97.5 F (36.4 C)-97.8 F (36.6 C)] 97.6 F (36.4 C) (02/07 0430) Pulse Rate:  [47-68] 58 (02/07 0430) Resp:  [11-20] 18 (02/07 0430) BP: (93-120)/(58-78) 102/64 (02/07 0430) SpO2:  [92 %-98 %] 92 % (02/07 0430) Weight:  [105.2 kg] 105.2 kg (02/06 0904)  Intake/Output from previous day: 02/06 0701 - 02/07 0700 In: 1150 [I.V.:550; IV Piggyback:600] Out: 550 [Urine:400; Blood:150] Intake/Output this shift: No intake/output data recorded.  Exam:  Sensation intact distally Intact pulses distally  Labs: No results for input(s): HGB in the last 72 hours. No results for input(s): WBC, RBC, HCT, PLT in the last 72 hours. No results for input(s): NA, K, CL, CO2, BUN, CREATININE, GLUCOSE, CALCIUM  in the last 72 hours. No results for input(s): LABPT, INR in the last 72 hours.  Assessment/Plan: Plan at this time is occupational therapy this morning.  Okay to discharge home after that.  May require slight increase in pain medicine for the first week postop.   Keith Gibbs 09/01/2023, 7:35 AM

## 2023-09-01 NOTE — Telephone Encounter (Signed)
 Patient's mom called. Says he did not receive the ointment RX. Was Dr. Rozelle Corning sending that RX in to the pharmacy? Her cb# [phone]

## 2023-09-01 NOTE — Evaluation (Signed)
 Occupational Therapy Evaluation Patient Details Name: Keith Gibbs MRN: [id number] DOB: [birth year] Today's Date: 09/01/2023   History of Present Illness Pt is a 62 y/o male presenting on 2/6 for planned R reverse shoulder arthroplasty from arthritis.  PMH includes: bipolar 1, CAD, arthritis, COPD, Hep C, HTN, MI, polysubstance abuse, schizophrenia, CVA, prior back surgery, R TKA.   Clinical Impression   PTA patient independent with ADLs and mobility. Admitted for above and presents with problem list below.  Pt educated on shoulder precautions, NWB, exercises, Adl compensatory techniques, recommendations and safety. Pt reports having support of his mother at home who can help minimally as needed.  Pt completing transfers and mobility using IV pole (per pt preference) with supervision and can use his cane at home. He requires min to supervision assist for ADLs.  Pt completed exercises as below.  Recommend follow OT per surgeon, no further acute OT needs identified.  OT will sign off.  Thank you for this referral.        If plan is discharge home, recommend the following: A little help with walking and/or transfers;A little help with bathing/dressing/bathroom;Assistance with cooking/housework;Assist for transportation;Help with stairs or ramp for entrance    Functional Status Assessment     Equipment Recommendations  None recommended by OT    Recommendations for Other Services       Precautions / Restrictions Precautions Precautions: Shoulder Type of Shoulder Precautions: NWB, AROM/PROMshoulder FF 0-90, Abduction 0-60, ER 0-30; AROM elbow, wrist, hand Shoulder Interventions: Shoulder sling/immobilizer;At all times;Off for dressing/bathing/exercises Precaution Booklet Issued: Yes (comment) Precaution Comments: reviewed with pt, but pt has difficulty recalling precautions at end of session Required Braces or Orthoses: Sling Restrictions Weight Bearing Restrictions Per Provider Order:  Yes RUE Weight Bearing Per Provider Order: Non weight bearing      Mobility Bed Mobility Overal bed mobility: Modified Independent                  Transfers Overall transfer level: Needs assistance   Transfers: Sit to/from Stand Sit to Stand: Supervision                  Balance Overall balance assessment: Mild deficits observed, not formally tested                                         ADL either performed or assessed with clinical judgement   ADL Overall ADL's : Needs assistance/impaired     Grooming: Supervision/safety;Standing           Upper Body Dressing : Minimal assistance;Sitting   Lower Body Dressing: Minimal assistance;Sit to/from stand   Toilet Transfer: Supervision/safety;Ambulation   Toileting- Clothing Manipulation and Hygiene: Supervision/safety;Sit to/from stand       Functional mobility during ADLs: Supervision/safety General ADL Comments: preference to using IV pole, will use cane at home     Vision   Vision Assessment?: No apparent visual deficits     Perception         Praxis         Pertinent Vitals/Pain Pain Assessment Pain Assessment: 0-10 Pain Score: 6  Pain Location: R shoulder Pain Descriptors / Indicators: Discomfort, Operative site guarding Pain Intervention(s): Monitored during session, Limited activity within patient's tolerance, Premedicated before session, Repositioned     Extremity/Trunk Assessment Upper Extremity Assessment Upper Extremity Assessment: RUE deficits/detail;Right hand dominant (bil UE tremors at baseline)  RUE Deficits / Details: pt s/p reverse shoulder surgery, sensation appears intact.  Pt able to complete AROM of hand, wrist and elbow. PROM to shoulder FF to 30*, ER to 15* and abduction to 30* RUE Sensation: WNL RUE Coordination: decreased gross motor   Lower Extremity Assessment Lower Extremity Assessment: Overall WFL for tasks assessed       Communication  Communication Communication: Hearing impairment Cueing Techniques: Verbal cues;Visual cues   Cognition Arousal: Alert Behavior During Therapy: WFL for tasks assessed/performed Overall Cognitive Status: Within Functional Limits for tasks assessed                                 General Comments: some difficulty recalling precautions, but provided handouts     General Comments       Exercises Exercises: Shoulder Shoulder Exercises Shoulder Flexion: PROM, Right, 5 reps, Seated (to 30) Shoulder ABduction: PROM, Right, 5 reps, Seated (to 30) Shoulder External Rotation: PROM, Right, 5 reps (to 15) Elbow Flexion: AROM, Right, 10 reps, Seated Elbow Extension: AROM, Right, 10 reps, Seated Wrist Flexion: AROM, Right, 10 reps, Seated Wrist Extension: AROM, 10 reps, Seated Digit Composite Flexion: AROM, Right, 10 reps, Seated Composite Extension: AROM, Right, 10 reps, Seated   Shoulder Instructions Shoulder Instructions Donning/doffing shirt without moving shoulder: Minimal assistance Method for sponge bathing under operated UE: Minimal assistance Donning/doffing sling/immobilizer: Minimal assistance Correct positioning of sling/immobilizer: Minimal assistance ROM for elbow, wrist and digits of operated UE: Supervision/safety Sling wearing schedule (on at all times/off for ADL's): Supervision/safety Proper positioning of operated UE when showering: Supervision/safety Positioning of UE while sleeping: Supervision/safety    Home Living Family/patient expects to be discharged to:: Private residence Living Arrangements: Parent (mother is 51) Available Help at Discharge: Family;Available 24 hours/day Type of Home: House Home Access: Stairs to enter [employer] of Steps: 6 Entrance Stairs-Rails: Can reach both Home Layout: One level     Bathroom Shower/Tub: Producer, Television/film/video: Standard     Home Equipment: Production Assistant, Radio - single point           Prior Functioning/Environment Prior Level of Function : Independent/Modified Independent             Mobility Comments: intermittent using cane ADLs Comments: able to manage ADLs, manages IADLs, not driving, mother manages his meds        OT Problem List: Decreased strength;Decreased range of motion;Decreased activity tolerance;Decreased coordination;Decreased knowledge of use of DME or AE;Decreased knowledge of precautions;Impaired UE functional use;Pain      OT Treatment/Interventions:      OT Goals(Current goals can be found in the care plan section) Acute Rehab OT Goals Patient Stated Goal: home today OT Goal Formulation: With patient  OT Frequency:      Co-evaluation              AM-PAC OT 6 Clicks Daily Activity     Outcome Measure Help from another person eating meals?: A Little Help from another person taking care of personal grooming?: A Little Help from another person toileting, which includes using toliet, bedpan, or urinal?: A Little Help from another person bathing (including washing, rinsing, drying)?: A Little Help from another person to put on and taking off regular upper body clothing?: A Little Help from another person to put on and taking off regular lower body clothing?: A Little 6 Click Score: 18   End of Session Equipment Utilized  During Treatment: Other (comment) (sling) Nurse Communication: Mobility status;Precautions  Activity Tolerance: Patient tolerated treatment well Patient left: with call bell/phone within reach;Other (comment) (sitting EOB)  OT Visit Diagnosis: Pain Pain - Right/Left: Right Pain - part of body: Shoulder                Time: 0935-1010 OT Time Calculation (min): 35 min Charges:  OT General Charges $OT Visit: 1 Visit OT Evaluation $OT Eval Low Complexity: 1 Low  Etta NOVAK, OT Acute Rehabilitation Services Office [phone]'   Etta GORMAN Hope 09/01/2023, 11:41 AM

## 2023-09-01 NOTE — Discharge Planning (Signed)
 Patient alert. IV access removed. Discharge teaching given to patient by Amina LPN. Discharge summary placed in discharge packet and put with patient belongings in the room. Patient will be transported via family member to his home.

## 2023-09-04 ENCOUNTER — Telehealth: Payer: Self-pay

## 2023-09-04 NOTE — Op Note (Signed)
 NAME: ALDAN, CAMEY MEDICAL RECORD NO: [id number] ACCOUNT NO: [id number] DATE OF BIRTH: August 24, 1960 FACILITY: [facility] LOCATION: [facility] PHYSICIAN: Cordella RAMAN. Addie, MD  Operative Report   DATE OF PROCEDURE: 08/31/2023  PREOPERATIVE DIAGNOSES:  Right shoulder arthritis and subscapularis tear.  POSTOPERATIVE DIAGNOSES:  Right shoulder arthritis and subscapularis tear.  PROCEDURE:  Right reverse shoulder replacement using Biomet glenosphere 36 +6 offset with small augmented baseplate, one central compression screw, four peripheral locking screws, and mini humeral stem size 14 with standard thickness +6 tapered offset  humeral tray and +3 liner. .  SURGEON:  Cordella Glendia Addie, MD  ASSISTANT:  April Green, RNFA  INDICATIONS:  The patient is a 63 year old patient with significant shoulder arthritis as well as subscapularis tearing who presents for operative management after estimation of risks and benefits. The patient is not a candidate for total shoulder  replacement due to prior subscapularis repair, subscap muscle atrophy, as well as subscapularis full-thickness tearing and general attritional degeneration.  DESCRIPTION OF PROCEDURE:  The patient was brought to the operating room where general endotracheal anesthesia was induced. Preop antibiotics were measured. Timeout was called. The patient was placed in the beach chair position with the head in neutral  position. Right shoulder, arm and hands prescrubbed with hydrogen peroxide followed by alcohol , then Betadine , which was allowed to air dry and then ChloraPrep solution. Ioban was used to seal the operative field, cover the axilla, and then cover the  operative field. Timeout was called.  Deltopectoral approach was made. Skin and subcutaneous tissue were sharply divided. The cephalic vein was absolutely identified and mobilized medially. Blunt tissue dissection was performed in order to develop a  plane between the deltoid and the pec  tendon.  Subdeltoid and subacromial adhesions were released. Full-thickness tear of the subscapularis was present, which was both off the tuberosity as well as a cleavage-type tear horizontally within the substance  of the tendon. The axillary nerve was palpated. There was scar tissue, however, between the lower portion of the conjoined tendon and the subscapularis itself. This was mobilized bluntly. Coracohumeral ligament was released. Circumflex vessels were not  present. At this time, the biceps tendon had already been tenodesed. Rotator interval was opened. Subscapularis detached using a #15 blade from the lesser tuberosity and this was taken through a progressive detachment using electrocautery after getting  it off the tuberosity. Tagged with two 0 Vicryl sutures. The tendon itself did have some structural integrity, but did have full-thickness tearing as well as attritional horizontal cleavage tearing.  The rotator interval was opened up to the base of the  coracoid. Next, capsular dissection was performed about 2 cm along the inferior humeral neck around to the 5 o'clock position on the humeral head. Severe arthritis was present on both the humeral head side and the glenoid side. Next, rotator cuff was  inspected. The supraspinatus actually looked fairly reasonable. The head was dislocated and the Kolbel retractors were removed. Next, the superior aspect of the humeral head was rongeured. The shaft was then reamed up to a size 13 and the humeral head  was cut in 30 degrees of retroversion. Next, broaching was performed up to a size 14 with very good bony contact and bone quality present. Cap was placed and attention was directed towards the glenoid. Posterior retractor was then placed. With four  twitches present, which had been present after about 10 minutes into the case, the labrum was circumferentially excised. Next, the guide was utilized and the pins  were placed. Correct placement was confirmed  using manual measurements. Reaming was then  performed in accordance with preoperative templating. This was done both with the standard reamer as well as the augmented reamer.  Once the reaming was performed, trial was placed and very good bony contact was achieved. Next, the Irrisept solution was  utilized as it was right after the incision. The baseplate was placed with one central compression screw and four peripheral locking screws, which got very good purchase. Next, we did trial reduction with the +3 and +6 humerus as well as the +3 and the  +6 glenosphere as well as the +6 humeral tray with the standard liner and the +3 liner. Optimal stability was achieved with the +6 glenosphere and the +6 offset humeral tray with the +3 liner. This gave excellent stability to adduction and extension as  well as with forward flexion, internal and external rotation. Hard to dislocate and hard to relocate. The stem was then removed. The trial stem was removed. Thorough irrigation was performed in the humeral canal.  Irrisept solution was placed. Six  SutureTapes were placed through the lesser tuberosity for later reattachment of the subscapularis. Next, the glenosphere was placed onto the dried Morse taper into the baseplate.  Very good fit was obtained. Next, we placed in the humeral stem after  placing vancomycin  powder into the canal after taking out the Irrisept. Good fit was obtained with the 14 stem. Repeat reduction was performed with the +6 humeral tray with the standard liner and the +3 liner. The +3 liner gave optimal stability without  too much distalization or lateralization. This was placed onto the dried [facility] taper. Same stability parameters were maintained. Axillary nerve intact. Thorough irrigation was performed with 3 liters of pouring irrigation. The subscap was then reattached  with the arm in 30 degrees of external rotation using Nice knots through the six SutureTapes. Through the rotator  interval, Irrisept solution was utilized and vancomycin  powder placed on the prosthesis. The rotator interval was then closed using #1  Vicryl suture with the arm in 30 degrees of external rotation. Overall, a nice tight repair was achieved around the implant. At this time, Irrisept solution and vancomycin  powder were placed on top of the deltopectoral interval, which was then closed  using #1 Vicryl suture followed by interrupted inverted 0 Vicryl suture, 2-0 Vicryl suture, and 3-0 Monocryl with Steri-Strips and Aquacel dressing applied along with shoulder immobilizer. The patient tolerated the procedure well without immediate  complications. Transferred to the recovery room in stable condition.       PAA D: 08/31/2023 3:37:46 pm T: 08/31/2023 9:40:00 pm  JOB: [id number]/ [id number]

## 2023-09-04 NOTE — Transitions of Care (Post Inpatient/ED Visit) (Signed)
   09/04/2023  Name: Keith Gibbs MRN: [id number] DOB: 1960-08-13  Today's TOC FU Call Status: Today's TOC FU Call Status:: Successful TOC FU Call Completed TOC FU Call Complete Date: 09/04/23 Patient's Name and Date of Birth confirmed.  Transition Care Management Follow-up Telephone Call Date of Discharge: 09/01/23 Discharge Facility: [facility]) Type of Discharge: Inpatient Admission Primary Inpatient Discharge Diagnosis:: right reverse shoulder arthroplasty How have you been since you were released from the hospital?: Same (He said he is sore, still hurting, taking his pain medications.) Any questions or concerns?: No  Items Reviewed: Did you receive and understand the discharge instructions provided?: Yes Medications obtained,verified, and reconciled?: Partial Review Completed Reason for Partial Mediation Review: He said he has all of his medications and did not have any questions about the med regime and did not need to review the med list.  He said the hospital staff reviewed the list with him before he left the hospital Any new allergies since your discharge?: No Dietary orders reviewed?: Yes Type of Diet Ordered:: heart healthy low sodium Do you have support at home?: Yes People in Home: parent(s) Name of Support/Comfort Primary Source: his mother  Medications Reviewed Today: Medications Reviewed Today   Medications were not reviewed in this encounter     Home Care and Equipment/Supplies: Were Home Health Services Ordered?: No Any new equipment or medical supplies ordered?: Yes Name of Medical supply agency?: ice machine ordered by orthopedic surgery Were you able to get the equipment/medical supplies?: Yes Do you have any questions related to the use of the equipment/supplies?: No  Functional Questionnaire: Do you need assistance with bathing/showering or dressing?: Yes (his mother assists as needed) Do you need assistance with meal preparation?: Yes (his  mother assists) Do you need assistance with eating?: No Do you have difficulty maintaining continence: No Do you need assistance with getting out of bed/getting out of a chair/moving?: Yes (ambulates without an assistive device;but has a sling with a cushion for his right arm and understands he is not to lift anything with the right arm.  He confirmed he has the ROM brace and is using the ice machine) Do you have difficulty managing or taking your medications?: Yes (his mother assists)  Follow up appointments reviewed: PCP Follow-up appointment confirmed?: Yes Date of PCP follow-up appointment?: 09/20/23 Follow-up Provider: Lavona Pounds, NP Specialist Hospital Follow-up appointment confirmed?: Yes Date of Specialist follow-up appointment?: 09/15/23 Follow-Up Specialty Provider:: orthopedic surgeon Do you need transportation to your follow-up appointment?: No Do you understand care options if your condition(s) worsen?: Yes-patient verbalized understanding    SIGNATURE Burnett Carson, RN

## 2023-09-06 NOTE — Telephone Encounter (Signed)
Replied to last note-HQ

## 2023-09-08 ENCOUNTER — Telehealth (HOSPITAL_COMMUNITY): Payer: MEDICAID | Admitting: Student in an Organized Health Care Education/Training Program

## 2023-09-08 ENCOUNTER — Other Ambulatory Visit: Payer: Self-pay | Admitting: Surgical

## 2023-09-08 ENCOUNTER — Telehealth: Payer: Self-pay | Admitting: Orthopedic Surgery

## 2023-09-08 ENCOUNTER — Encounter (HOSPITAL_COMMUNITY): Payer: Self-pay | Admitting: Student in an Organized Health Care Education/Training Program

## 2023-09-08 DIAGNOSIS — F1011 Alcohol abuse, in remission: Secondary | ICD-10-CM

## 2023-09-08 DIAGNOSIS — F1411 Cocaine abuse, in remission: Secondary | ICD-10-CM

## 2023-09-08 DIAGNOSIS — F250 Schizoaffective disorder, bipolar type: Secondary | ICD-10-CM | POA: Diagnosis not present

## 2023-09-08 MED ORDER — OXYCODONE HCL 5 MG PO TABS
5.0000 mg | ORAL_TABLET | ORAL | 0 refills | Status: DC | PRN
Start: 2023-09-08 — End: 2023-09-15
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
## 2023-09-08 NOTE — Telephone Encounter (Signed)
Glad he is keeping up with his exercises and using the CPM machine.  I did refill his oxycodone on to take 5 to 10 mg every 4 hours.  I think it is okay to take 10 mg on occasion but I would not do this routinely because we need to wean off of oxycodone by 4 to 6 weeks postop.  We will continue tapering his medication with the next refill

## 2023-09-08 NOTE — Telephone Encounter (Signed)
Patient called needing Rx refilled Oxycodone. Patient asked if the dosage can be upped to 10mg ? Patient said he is doing his exercises and using the CPM machine.  Patient said he uses [facility] on [address]. Patient said he is in a lot of pain. The number to contact patient is [phone]

## 2023-09-08 NOTE — Telephone Encounter (Signed)
Patient aware of the below message

## 2023-09-12 NOTE — Discharge Summary (Signed)
 Physician Discharge Summary      Patient ID: Keith Gibbs MRN: [id number] DOB/AGE: 63-Apr-1962 63 y.o.  Admit date: 08/31/2023 Discharge date: 09/01/2023  Admission Diagnoses:  Principal Problem:   Shoulder arthritis   Discharge Diagnoses:  Same  Surgeries: Procedure(s): RIGHT REVERSE SHOULDER ARTHROPLASTY on 08/31/2023   Consultants:   Discharged Condition: Stable  Hospital Course: Keith Gibbs is an 63 y.o. male who was admitted 08/31/2023 with a chief complaint of right shoulder arthritis, and found to have a diagnosis of Shoulder arthritis.  They were brought to the operating room on 08/31/2023 and underwent the above named procedures.  Patient tolerated procedure well without immediate complications.  Was seen by Occupational Therapy on postop day #1 and mobilize well.  Pain was controlled on narcotic pain medicine along with muscle relaxers.  He will follow-up in 2 weeks.  Home health physical therapy arranged.  Antibiotics given:  Anti-infectives (From admission, onward)    Start     Dose/Rate Route Frequency Ordered Stop   08/31/23 1800  ceFAZolin  (ANCEF ) IVPB 2g/100 mL premix        2 g 200 mL/hr over 30 Minutes Intravenous Every 6 hours 08/31/23 1654 09/01/23 0211   08/31/23 1127  vancomycin  (VANCOCIN ) powder  Status:  Discontinued          As needed 08/31/23 1127 08/31/23 1514   08/31/23 0953  ceFAZolin  (ANCEF ) IVPB 2g/100 mL premix        2 g 200 mL/hr over 30 Minutes Intravenous On call to O.R. 08/31/23 0851 08/31/23 1200   08/31/23 0858  ceFAZolin  (ANCEF ) 2-4 GM/100ML-% IVPB       Note to Pharmacy: Larina Mays A: cabinet override      08/31/23 0858 08/31/23 1139     .  Recent vital signs:  Vitals:   09/01/23 0430 09/01/23 0849  BP: 102/64 113/67  Pulse: (!) 58 66  Resp: 18 18  Temp: 97.6 F (36.4 C) 98.2 F (36.8 C)  SpO2: 92% 92%    Recent laboratory studies:  Results for orders placed or performed during the hospital encounter of 08/24/23   Surgical pcr screen   Collection Time: 08/24/23  1:27 PM   Specimen: Nasal Mucosa; Nasal Swab  Result Value Ref Range   MRSA, PCR NEGATIVE NEGATIVE   Staphylococcus aureus POSITIVE (A) NEGATIVE  Urinalysis, w/ Reflex to Culture (Infection Suspected) -Urine, Clean Catch   Collection Time: 08/24/23  1:27 PM  Result Value Ref Range   Specimen Source URINE, CLEAN CATCH    Color, Urine YELLOW YELLOW   APPearance CLEAR CLEAR   Specific Gravity, Urine 1.016 1.005 - 1.030   pH 5.0 5.0 - 8.0   Glucose, UA NEGATIVE NEGATIVE mg/dL   Hgb urine dipstick SMALL (A) NEGATIVE   Bilirubin Urine NEGATIVE NEGATIVE   Ketones, ur NEGATIVE NEGATIVE mg/dL   Protein, ur NEGATIVE NEGATIVE mg/dL   Nitrite NEGATIVE NEGATIVE   Leukocytes,Ua NEGATIVE NEGATIVE   RBC / HPF 11-20 0 - 5 RBC/hpf   WBC, UA 0-5 0 - 5 WBC/hpf   Bacteria, UA NONE SEEN NONE SEEN   Squamous Epithelial / HPF 0-5 0 - 5 /HPF   Mucus PRESENT   CBC   Collection Time: 08/24/23  2:00 PM  Result Value Ref Range   WBC 7.4 4.0 - 10.5 K/uL   RBC 4.92 4.22 - 5.81 MIL/uL   Hemoglobin 15.6 13.0 - 17.0 g/dL   HCT 54.1 60.9 - 47.9 %   MCV 93.1  80.0 - 100.0 fL   MCH 31.7 26.0 - 34.0 pg   MCHC 34.1 30.0 - 36.0 g/dL   RDW 87.1 88.4 - 84.4 %   Platelets 165 150 - 400 K/uL   nRBC 0.0 0.0 - 0.2 %  Basic metabolic panel   Collection Time: 08/24/23  2:00 PM  Result Value Ref Range   Sodium 136 135 - 145 mmol/L   Potassium 4.1 3.5 - 5.1 mmol/L   Chloride 103 98 - 111 mmol/L   CO2 25 22 - 32 mmol/L   Glucose, Bld 100 (H) 70 - 99 mg/dL   BUN 10 8 - 23 mg/dL   Creatinine, Ser 8.99 0.61 - 1.24 mg/dL   Calcium  8.9 8.9 - 10.3 mg/dL   GFR, Estimated >39 >39 mL/min   Anion gap 8 5 - 15    Discharge Medications:   Allergies as of 09/01/2023       Reactions   Asenapine Other (See Comments), Nausea And Vomiting   Increased tremors   Dextromethorphan Hbr    Guaifenesin     Latuda [lurasidone Hcl] Other (See Comments)   Tremors   Lurasidone Other  (See Comments), Hives   Other Reaction(s): Angioedema   Phenylephrine          Medication List     STOP taking these medications    azithromycin  250 MG tablet Commonly known as: ZITHROMAX    cyclobenzaprine  10 MG tablet Commonly known as: FLEXERIL    diclofenac  Sodium 1 % Gel Commonly known as: VOLTAREN    hydrOXYzine  25 MG tablet Commonly known as: ATARAX    traMADol  50 MG tablet Commonly known as: ULTRAM    triamcinolone  cream 0.1 % Commonly known as: KENALOG        TAKE these medications    acetaminophen  325 MG tablet Commonly known as: TYLENOL  Take 1 tablet (325 mg total) by mouth every 6 (six) hours as needed for mild pain (pain score 1-3) (or temp > 100.5).   albuterol  108 (90 Base) MCG/ACT inhaler Commonly known as: VENTOLIN  HFA Inhale 2 puffs into the lungs every 6 (six) hours as needed for wheezing or shortness of breath.   aspirin  EC 81 MG tablet Take 1 tablet (81 mg total) by mouth daily. Swallow whole.   atorvastatin  80 MG tablet Commonly known as: LIPITOR  Take 1 tablet (80 mg total) by mouth daily.   Breztri  Aerosphere 160-9-4.8 MCG/ACT Aero Generic drug: Budeson-Glycopyrrol-Formoterol  Inhale 2 puffs into the lungs in the morning and at bedtime.   celecoxib  200 MG capsule Commonly known as: CELEBREX  Take 1 capsule (200 mg total) by mouth 2 (two) times daily.   divalproex  500 MG DR tablet Commonly known as: DEPAKOTE  Take 1 tablet in the morning and 2 tablets at bedtime   docusate sodium  100 MG capsule Commonly known as: COLACE Take 1 capsule (100 mg total) by mouth 2 (two) times daily.   DULoxetine  30 MG capsule Commonly known as: CYMBALTA  Take 3 capsules (90 mg total) by mouth daily. What changed:  how much to take when to take this   gabapentin  600 MG tablet Commonly known as: NEURONTIN  Take 1 tablet (600 mg total) by mouth daily AND 2 tablets (1,200 mg total) at bedtime.   nitroGLYCERIN  0.4 MG SL tablet Commonly known as:  Nitrostat  Place 1 tablet (0.4 mg total) under the tongue every 5 (five) minutes as needed for chest pain.   pantoprazole  40 MG tablet Commonly known as: PROTONIX  Take 1 tablet (40 mg total) by mouth daily.   traZODone   100 MG tablet Commonly known as: DESYREL  Take 1 tablet (100 mg total) by mouth at bedtime.        Diagnostic Studies: DG Shoulder Right Port Result Date: 08/31/2023 CLINICAL DATA:  212057 Shoulder arthritis 212057 EXAM: RIGHT SHOULDER - 1 VIEW COMPARISON:  X-ray right shoulder 04/12/2023, CT right shoulder 07/04/2023. FINDINGS: Status post total right shorter reversed arthroplasty. There is no evidence of fracture or dislocation. There is no evidence of arthropathy or other focal bone abnormality. Subcutaneus soft tissue edema and emphysema consistent with postsurgical changes. IMPRESSION: Status post total right shoulder reverse arthroplasty with associated expected postsurgical changes of the overlying soft tissues. Electronically Signed   By: Morgane  Naveau M.D.   On: 08/31/2023 19:33    Disposition: Discharge disposition: 01-Home or Self Care       Discharge Instructions     Call MD / Call 911   Complete by: As directed    If you experience chest pain or shortness of breath, CALL 911 and be transported to the hospital emergency room.  If you develope a fever above 101 F, pus (white drainage) or increased drainage or redness at the wound, or calf pain, call your surgeon's office.   Constipation Prevention   Complete by: As directed    Drink plenty of fluids.  Prune juice may be helpful.  You may use a stool softener, such as Colace (over the counter) 100 mg twice a day.  Use MiraLax  (over the counter) for constipation as needed.   Diet - low sodium heart healthy   Complete by: As directed    Discharge instructions   Complete by: As directed    Okay to shower dressing is waterproof Used the shoulder range of motion brace 1 hour 3 times a day No lifting with  right arm   Increase activity slowly as tolerated   Complete by: As directed    Post-operative opioid taper instructions:   Complete by: As directed    POST-OPERATIVE OPIOID TAPER INSTRUCTIONS: It is important to wean off of your opioid medication as soon as possible. If you do not need pain medication after your surgery it is ok to stop day one. Opioids include: Codeine , Hydrocodone (Norco, Vicodin), Oxycodone (Percocet, oxycontin ) and hydromorphone  amongst others.  Long term and even short term use of opiods can cause: Increased pain response Dependence Constipation Depression Respiratory depression And more.  Withdrawal symptoms can include Flu like symptoms Nausea, vomiting And more Techniques to manage these symptoms Hydrate well Eat regular healthy meals Stay active Use relaxation techniques(deep breathing, meditating, yoga) Do Not substitute Alcohol  to help with tapering If you have been on opioids for less than two weeks and do not have pain than it is ok to stop all together.  Plan to wean off of opioids This plan should start within one week post op of your joint replacement. Maintain the same interval or time between taking each dose and first decrease the dose.  Cut the total daily intake of opioids by one tablet each day Next start to increase the time between doses. The last dose that should be eliminated is the evening dose.           Follow-up Information     Lorren Greig PARAS, NP Follow up.   Specialty: Nurse Practitioner Contact information: [address] [place] [address] [phone]                  Signed: KANDICE Glendia Hutchinson 09/12/2023, 11:45 AM

## 2023-09-15 ENCOUNTER — Other Ambulatory Visit (INDEPENDENT_AMBULATORY_CARE_PROVIDER_SITE_OTHER): Payer: MEDICAID

## 2023-09-15 ENCOUNTER — Encounter: Payer: Self-pay | Admitting: Surgical

## 2023-09-15 ENCOUNTER — Other Ambulatory Visit (HOSPITAL_COMMUNITY): Payer: Self-pay | Admitting: Student in an Organized Health Care Education/Training Program

## 2023-09-15 ENCOUNTER — Other Ambulatory Visit: Payer: Self-pay | Admitting: Family

## 2023-09-15 ENCOUNTER — Ambulatory Visit: Payer: MEDICAID | Admitting: Surgical

## 2023-09-15 DIAGNOSIS — M25511 Pain in right shoulder: Secondary | ICD-10-CM

## 2023-09-15 DIAGNOSIS — F4310 Post-traumatic stress disorder, unspecified: Secondary | ICD-10-CM

## 2023-09-15 DIAGNOSIS — K219 Gastro-esophageal reflux disease without esophagitis: Secondary | ICD-10-CM

## 2023-09-15 DIAGNOSIS — F1011 Alcohol abuse, in remission: Secondary | ICD-10-CM

## 2023-09-15 DIAGNOSIS — F1111 Opioid abuse, in remission: Secondary | ICD-10-CM

## 2023-09-15 DIAGNOSIS — F250 Schizoaffective disorder, bipolar type: Secondary | ICD-10-CM

## 2023-09-15 DIAGNOSIS — F1411 Cocaine abuse, in remission: Secondary | ICD-10-CM

## 2023-09-15 DIAGNOSIS — Z96611 Presence of right artificial shoulder joint: Secondary | ICD-10-CM

## 2023-09-15 MED ORDER — HYDROXYZINE HCL 25 MG PO TABS: 25.0000 mg | ORAL_TABLET | Freq: Two times a day (BID) | ORAL | 0 refills | Status: DC | PRN

## 2023-09-15 MED ORDER — GABAPENTIN 600 MG PO TABS: ORAL_TABLET | ORAL | 2 refills | Status: DC

## 2023-09-15 MED ORDER — TRAZODONE HCL 100 MG PO TABS: 100.0000 mg | ORAL_TABLET | Freq: Every day | ORAL | 2 refills | Status: DC

## 2023-09-15 MED ORDER — QUETIAPINE FUMARATE 200 MG PO TABS: 200.0000 mg | ORAL_TABLET | Freq: Two times a day (BID) | ORAL | 2 refills | Status: DC

## 2023-09-15 MED ORDER — OXYCODONE HCL 5 MG PO TABS: 5.0000 mg | ORAL_TABLET | ORAL | 0 refills | Status: DC | PRN

## 2023-09-15 MED ORDER — DULOXETINE HCL 30 MG PO CPEP
90.0000 mg | ORAL_CAPSULE | Freq: Every day | ORAL | 2 refills | Status: DC
Start: 2023-09-15 — End: 2023-11-10

## 2023-09-15 MED ORDER — DIVALPROEX SODIUM 500 MG PO DR TAB: DELAYED_RELEASE_TABLET | ORAL | 2 refills | Status: DC

## 2023-09-15 NOTE — Telephone Encounter (Signed)
 Complete

## 2023-09-15 NOTE — Progress Notes (Signed)
Post-Op Visit Note   Patient: Keith Gibbs           Date of Birth: [birth year]           MRN: [id number] Visit Date: 09/15/2023 PCP: Rema Fendt, NP   Assessment & Plan:  Chief Complaint:  Chief Complaint  Patient presents with   Right Shoulder - Routine Post Op   Visit Diagnoses:  1. Acute pain of right shoulder   2. History of arthroplasty of right shoulder     Plan: DEVARIOUS PAVEK is a 63 y.o. male who presents s/p right reverse shoulder arthroplasty on 08/31/2023.  Patient is doing well and pain is overall controlled.  Using CPM machine as instructed.  Denies any chest pain, SOB, fevers, chills.  No complaint of any instability symptoms.  Using sling.  Taking oxycodone about every 4 hours (10 mg).    On exam, patient has range of motion 20 degrees X rotation, 90 degrees abduction, 100 agrees forward elevation passively.  Intact EPL, FPL, finger abduction, finger adduction, pronation/supination, bicep, tricep, deltoid of operative extremity.  Axillary nerve intact with deltoid firing.  Incision is healing well without evidence of infection or dehiscence.  Incision was made sure to be covered with Steri-Strips from the proximal to distal aspect of the length of the incision.  2+ radial pulse of the operative extremity  Plan is discontinue sling.  Okay to very lightly lift with the operative extremity but no lifting anything heavier than a coffee cup or cell phone.  Start physical therapy to focus on passive range of motion and active range of motion with deltoid isometrics.  Do not want to externally rotate past 30 degrees to protect subscapularis repair.  Follow-up in 4 weeks for clinical recheck with Dr. August Saucer.  Refilled oxycodone today to take 1 to 2 tablets every 4 hours as needed.  He understands that we need to wean off of this medication with goal to be completely off oxycodone by 6 weeks postop.  Follow-Up Instructions: Return in about 4 weeks (around 10/13/2023).    Orders:  Orders Placed This Encounter  Procedures   XR Shoulder Right   Ambulatory referral to Physical Therapy   No orders of the defined types were placed in this encounter.   Imaging: No results found.  PMFS History: Patient Active Problem List   Diagnosis Date Noted   Shoulder arthritis 08/31/2023   Lumbar radiculopathy 05/30/2023   Major depressive disorder, recurrent severe without psychotic features (HCC) 01/28/2023   S/P lumbar fusion 08/23/2022   Postlaminectomy syndrome, not elsewhere classified 08/12/2022   MDD (major depressive disorder) 08/10/2022   Substance induced mood disorder (HCC) 08/06/2022   Suicide attempt by drug overdose (HCC) 08/06/2022   Toxic encephalopathy 08/03/2022   Suicide ideation 08/03/2022   Encephalopathy 08/03/2022   Polysubstance abuse (HCC) 04/28/2022   Postoperative abdominal pain 02/28/2022   Acute cholecystitis 02/17/2022   Obstruction of nasal valve 01/07/2022   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   Pulmonary nodule 12/17/2021   CAD S/P percutaneous coronary angioplasty 12/17/2021   Epigastric abdominal pain    Nausea and vomiting    Tobacco dependence due to cigarettes 08/31/2021   Iron deficiency anemia secondary to inadequate dietary iron intake 08/31/2021   Acute lead-induced gout involving toe of left foot 08/31/2021   Encounter for lipid screening for cardiovascular disease 08/31/2021   Hypotension due to drugs 06/21/2021   Opioid withdrawal (HCC) 06/21/2021   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HCC) 06/21/2021   RLS (  restless legs syndrome) 05/07/2021   Acquired deviated nasal septum 05/03/2021   Encounter for surveillance of abnormal nevi 05/03/2021   Flu vaccine need 05/03/2021   Substance abuse (HCC) 05/03/2021   Drug abuse and dependence (HCC) 05/03/2021   Need for shingles vaccine 05/03/2021   Right groin pain 01/29/2021   S/P TKR (total knee replacement) using cement, right 01/19/2021   Cigarette smoker 07/21/2020   Foot pain, bilateral  12/25/2019   Potential exposure to STD 12/12/2019   History of lumbar surgery 12/12/2019   Fatigue 12/12/2019   Penile lesion 12/12/2019   Right testicular pain 12/12/2019   Body mass index 28.0-28.9, adult 12/12/2019   History of COPD 12/12/2019   Tobacco use disorder, continuous 12/12/2019   Hepatoma (HCC) 11/05/2019   Anxiety 12/10/2018   Essential hypertension 09/18/2018   Hypercholesteremia 09/18/2018   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   Chronic anticoagulation (Plavix) 04/19/2018   Pain medication agreement broken ([facility]) 04/19/2018   Cocaine use 04/18/2018   Cocaine abuse (HCC) (See 04/12/18 UDS) 04/18/2018   Marijuana abuse (See 04/12/18 UDS) 04/18/2018   Long term current use of opiate analgesic 04/12/2018   Cirrhosis of liver (HCC) 04/03/2018   BPH (benign prostatic hyperplasia) 04/03/2018   Hematuria, microscopic 04/03/2018   Osteoarthritis of the knee (Left) 11/02/2017   Lumbar facet syndrome (Bilateral) 11/01/2017   Spondylosis without myelopathy or radiculopathy, lumbosacral region 11/01/2017   Osteoarthritis of shoulder (Right) 11/01/2017   Osteoarthritis of acromioclavicular joint (Right) 11/01/2017   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) 11/01/2017   Osteoarthritis 11/01/2017   Cervicalgia 11/01/2017   Cervical facet syndrome 11/01/2017   DDD (degenerative disc disease), cervical 11/01/2017   DDD (degenerative disc disease), lumbar 11/01/2017   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) 09/18/2017   Other chronic pain 09/18/2017   Chronic musculoskeletal pain 09/18/2017   Chronic pain of right knee 09/11/2017   Chronic low back pain (Secondary Area of Pain) (Bilateral) 09/11/2017   Chronic hip pain (Tertiary Area of Pain) (Bilateral) (R>L) 09/11/2017   Chronic shoulder pain (Fourth Area of Pain) (Right) 09/11/2017   Spondylosis without myelopathy or radiculopathy, cervical region 09/11/2017   Chronic pain syndrome 09/11/2017   Opiate use  09/11/2017   Screen for STD (sexually transmitted disease) 09/11/2017   Disorder of skeletal system 09/11/2017   Problems influencing health status 09/11/2017   Overdose of medication, intentional self-harm, sequela (HCC) 12/28/2016   Severe recurrent major depression without psychotic features (HCC) 12/27/2016   Alcohol use disorder, moderate, dependence (HCC) 12/27/2016   Cannabis use disorder, moderate, dependence (HCC) 12/27/2016   Chest pain 06/10/2016   Bipolar 1 disorder (HCC) 05/27/2013   Myalgia 05/27/2013   Abnormal ejaculation 11/30/2012   Chest pain, unspecified 11/30/2012   Degenerative arthritis of hip 11/16/2012   Schizoaffective disorder (HCC) 06/27/2012   COPD (chronic obstructive pulmonary disease) (HCC) 06/27/2012   Chronic hepatitis C (HCC) 06/27/2012   GERD (gastroesophageal reflux disease) 06/27/2012   Complete rupture of rotator cuff 02/10/2012   Cocaine abuse in remission (HCC) 12/05/2011   Unspecified osteoarthritis, unspecified site 07/27/2011   Past Medical History:  Diagnosis Date   Anginal pain (HCC)    Anxiety    Arthritis    Bipolar 1 disorder (HCC)    Bursitis    CAD (coronary artery disease)    Chronic pain    COPD (chronic obstructive pulmonary disease) (HCC)    Current use of long term anticoagulation    DAPT (ASA + clopidogrel)  Depression    Diverticulitis    Dyspnea    GERD (gastroesophageal reflux disease)    Grade I diastolic dysfunction    Hepatitis C 2012   No longer has Hep C   HLD (hyperlipidemia)    Hypertension    MI (myocardial infarction) (HCC)    Polysubstance abuse (HCC)    cocaine, marijuana, ETOH   PUD (peptic ulcer disease)    S/P angioplasty with stent 06/10/2016   a.) 90% stenosis of pLAD to mLAD - 2.5 x 18 mm Xience Alpine (DES x 1) placed to pLAD   S/P PTCA (percutaneous transluminal coronary angioplasty) 12/04/2019   a.) 60% in stent restenosis of DES to pLAD; LVEF 65%.   Schizophrenia (HCC)    Stroke [facility])     Valvular insufficiency    a.) Mild MR, TR, PR; mild to moderate AR on 03/05/2018 TTE    Family History  Problem Relation Age of Onset   Osteoarthritis Mother    Heart disease Mother    Hypertension Mother    Depression Mother    Heart disease Father    Early death Father    Hypertension Father    Heart attack Father    Hypertension Sister    Parkinson's disease Maternal Grandfather    Prostate cancer Neg Hx    Bladder Cancer Neg Hx    Kidney cancer Neg Hx    Tremor Neg Hx     Past Surgical History:  Procedure Laterality Date   ABDOMINAL SURGERY     removed small piece of intestines due to [facility] Diverticulosis   ANTERIOR LAT LUMBAR FUSION N/A 08/23/2022   Procedure: DIRECT LATERAL INTERBODY FUSION  LUMBAR TWO- LUMBAR THREE, LUMBAR THREE-LUMBAR FOUR , EXPLORE AND EXTEND FUSION LUMBAR TWO-LUMBAR FIVE, POSTERIOR DECOMPRESSION LUMBAR THREE-LUMBAR FOUR, LEFT LUMBAR TWO-LUMBAR THREE;  Surgeon: Bedelia Person, MD;  Location: [facility] OR;  Service: Neurosurgery;  Laterality: N/A;   APPENDECTOMY     BACK SURGERY     CARDIAC CATHETERIZATION Left 06/10/2016   Procedure: Left Heart Cath and Coronary Angiography;  Surgeon: Laurier Nancy, MD;  Location: [facility] INVASIVE CV LAB;  Service: Cardiovascular;  Laterality: Left;   CARDIAC CATHETERIZATION N/A 06/10/2016   Procedure: Coronary Stent Intervention;  Surgeon: Alwyn Pea, MD;  Location: [facility] INVASIVE CV LAB;  Service: Cardiovascular;  Laterality: N/A;   CHOLECYSTECTOMY N/A 02/17/2022   Procedure: LAPAROSCOPIC CHOLECYSTECTOMY;  Surgeon: Sheliah Hatch De Blanch, MD;  Location: [facility] OR;  Service: General;  Laterality: N/A;   COLON SURGERY     COLONOSCOPY     COLONOSCOPY WITH PROPOFOL N/A 01/05/2017   Procedure: COLONOSCOPY WITH PROPOFOL;  Surgeon: Wyline Mood, MD;  Location: [facility] ENDOSCOPY;  Service: Endoscopy;  Laterality: N/A;   COLONOSCOPY WITH PROPOFOL N/A 02/13/2020   Procedure: COLONOSCOPY WITH PROPOFOL;  Surgeon: Pasty Spillers, MD;  Location: [facility] ENDOSCOPY;  Service: Endoscopy;  Laterality: N/A;   CORONARY ANGIOPLASTY WITH STENT PLACEMENT     CORONARY PRESSURE/FFR STUDY N/A 12/04/2019   Procedure: INTRAVASCULAR PRESSURE WIRE/FFR STUDY;  Surgeon: Tonny Bollman, MD;  Location: [facility] INVASIVE CV LAB;  Service: Cardiovascular;  Laterality: N/A;   ESOPHAGOGASTRODUODENOSCOPY (EGD) WITH PROPOFOL N/A 01/05/2017   Procedure: ESOPHAGOGASTRODUODENOSCOPY (EGD) WITH PROPOFOL;  Surgeon: Wyline Mood, MD;  Location: [facility] ENDOSCOPY;  Service: Endoscopy;  Laterality: N/A;   ESOPHAGOGASTRODUODENOSCOPY (EGD) WITH PROPOFOL N/A 02/13/2020   Procedure: ESOPHAGOGASTRODUODENOSCOPY (EGD) WITH PROPOFOL;  Surgeon: Pasty Spillers, MD;  Location: [facility] ENDOSCOPY;  Service: Endoscopy;  Laterality: N/A;  KNEE ARTHROSCOPY WITH MEDIAL MENISECTOMY Right 09/05/2017   Procedure: KNEE ARTHROSCOPY WITH MEDIAL AND LATERAL  MENISECTOMY PARTIAL SYNOVECTOMY;  Surgeon: Kennedy Bucker, MD;  Location: [facility] ORS;  Service: Orthopedics;  Laterality: Right;   LEFT HEART CATH AND CORONARY ANGIOGRAPHY N/A 12/04/2019   Procedure: LEFT HEART CATH AND CORONARY ANGIOGRAPHY;  Surgeon: Tonny Bollman, MD;  Location: [facility] INVASIVE CV LAB;  Service: Cardiovascular;  Laterality: N/A;   LEFT HEART CATH AND CORONARY ANGIOGRAPHY Left 11/25/2022   Procedure: LEFT HEART CATH AND CORONARY ANGIOGRAPHY;  Surgeon: Laurier Nancy, MD;  Location: [facility] INVASIVE CV LAB;  Service: Cardiovascular;  Laterality: Left;   REVERSE SHOULDER ARTHROPLASTY Right 08/31/2023   Procedure: RIGHT REVERSE SHOULDER ARTHROPLASTY;  Surgeon: Cammy Copa, MD;  Location: [facility] OR;  Service: Orthopedics;  Laterality: Right;   SHOULDER SURGERY Right 04/09/2012   SPINE SURGERY     TOTAL KNEE ARTHROPLASTY Right 01/19/2021   Procedure: TOTAL KNEE ARTHROPLASTY - [place] Neighbor to Assist;  Surgeon: Kennedy Bucker, MD;  Location: [facility] ORS;  Service: Orthopedics;  Laterality: Right;   Social History   Occupational  History   Occupation: disability   Occupation: stocking at gas station    Comment: 2 days per week  Tobacco Use   Smoking status: Some Days    Current packs/day: 0.00    Average packs/day: 0.5 packs/day for 32.0 years (16.0 ttl pk-yrs)    Types: Cigarettes    Start date: 10/25/1989    Last attempt to quit: 10/25/2021    Years since quitting: 1.8    Passive exposure: Current   Smokeless tobacco: Never   Tobacco comments:     Quit 06/2023  Vaping Use   Vaping status: Never Used  Substance and Sexual Activity   Alcohol use: Not Currently    Alcohol/week: 3.0 standard drinks of alcohol    Types: 3 Cans of beer per week    Comment: occassionally    Drug use: Not Currently    Types: Cocaine, Marijuana    Comment: last on saturday   Sexual activity: Not Currently    Partners: Female    Birth control/protection: None

## 2023-09-18 ENCOUNTER — Ambulatory Visit: Payer: MEDICAID | Admitting: Cardiovascular Disease

## 2023-09-20 ENCOUNTER — Encounter: Payer: Self-pay | Admitting: Family

## 2023-09-20 ENCOUNTER — Ambulatory Visit (INDEPENDENT_AMBULATORY_CARE_PROVIDER_SITE_OTHER): Payer: MEDICAID | Admitting: Family

## 2023-09-20 VITALS — BP 124/77 | HR 55 | Temp 97.5°F | Ht 75.0 in | Wt 228.6 lb

## 2023-09-20 DIAGNOSIS — Z87438 Personal history of other diseases of male genital organs: Secondary | ICD-10-CM | POA: Diagnosis not present

## 2023-09-20 DIAGNOSIS — J449 Chronic obstructive pulmonary disease, unspecified: Secondary | ICD-10-CM

## 2023-09-20 DIAGNOSIS — K219 Gastro-esophageal reflux disease without esophagitis: Secondary | ICD-10-CM | POA: Diagnosis not present

## 2023-09-20 DIAGNOSIS — J431 Panlobular emphysema: Secondary | ICD-10-CM | POA: Diagnosis not present

## 2023-09-20 MED ORDER — BREZTRI AEROSPHERE 160-9-4.8 MCG/ACT IN AERO: 2.0000 | INHALATION_SPRAY | Freq: Two times a day (BID) | RESPIRATORY_TRACT | 2 refills | Status: DC

## 2023-09-20 MED ORDER — PANTOPRAZOLE SODIUM 40 MG PO TBEC: 40.0000 mg | DELAYED_RELEASE_TABLET | Freq: Every day | ORAL | 0 refills | Status: DC

## 2023-09-20 MED ORDER — ALBUTEROL SULFATE HFA 108 (90 BASE) MCG/ACT IN AERS
2.0000 | INHALATION_SPRAY | Freq: Four times a day (QID) | RESPIRATORY_TRACT | 2 refills | Status: DC | PRN
Start: 2023-09-20 — End: 2023-12-19

## 2023-09-20 NOTE — Progress Notes (Signed)
 Patient states he has a problem with urologist, states they wont accept him.

## 2023-09-20 NOTE — Progress Notes (Signed)
 Patient ID: Keith Gibbs, male    DOB: [birth year]  MRN: [id number]  CC: Chronic Conditions Follow-Up  Subjective: Keith Gibbs is a 63 y.o. male who presents for chronic conditions follow-up.   His concerns today include:  - Doing well on Albuterol inhaler and Budeson-Glycopyrrol-Formoterol inhaler, no issues/concerns. Denies red flag symptoms. Reports he has not established with Pulmonology. - Doing well on Pantoprazole, no issues/concerns.  - States he needs a new referral to Urology due to previous referral office would not accept his health insurance.   Patient Active Problem List   Diagnosis Date Noted   Shoulder arthritis 08/31/2023   Lumbar radiculopathy 05/30/2023   Major depressive disorder, recurrent severe without psychotic features (HCC) 01/28/2023   S/P lumbar fusion 08/23/2022   Postlaminectomy syndrome, not elsewhere classified 08/12/2022   MDD (major depressive disorder) 08/10/2022   Substance induced mood disorder (HCC) 08/06/2022   Suicide attempt by drug overdose (HCC) 08/06/2022   Toxic encephalopathy 08/03/2022   Suicide ideation 08/03/2022   Encephalopathy 08/03/2022   Polysubstance abuse (HCC) 04/28/2022   Postoperative abdominal pain 02/28/2022   Acute cholecystitis 02/17/2022   Obstruction of nasal valve 01/07/2022   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   Pulmonary nodule 12/17/2021   CAD S/P percutaneous coronary angioplasty 12/17/2021   Epigastric abdominal pain    Nausea and vomiting    Tobacco dependence due to cigarettes 08/31/2021   Iron deficiency anemia secondary to inadequate dietary iron intake 08/31/2021   Acute lead-induced gout involving toe of left foot 08/31/2021   Encounter for lipid screening for cardiovascular disease 08/31/2021   Hypotension due to drugs 06/21/2021   Opioid withdrawal (HCC) 06/21/2021   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HCC) 06/21/2021   RLS (restless legs syndrome) 05/07/2021   Acquired deviated nasal septum 05/03/2021   Encounter for  surveillance of abnormal nevi 05/03/2021   Flu vaccine need 05/03/2021   Substance abuse (HCC) 05/03/2021   Drug abuse and dependence (HCC) 05/03/2021   Need for shingles vaccine 05/03/2021   Right groin pain 01/29/2021   S/P TKR (total knee replacement) using cement, right 01/19/2021   Cigarette smoker 07/21/2020   Foot pain, bilateral 12/25/2019   Potential exposure to STD 12/12/2019   History of lumbar surgery 12/12/2019   Fatigue 12/12/2019   Penile lesion 12/12/2019   Right testicular pain 12/12/2019   Body mass index 28.0-28.9, adult 12/12/2019   History of COPD 12/12/2019   Tobacco use disorder, continuous 12/12/2019   Hepatoma (HCC) 11/05/2019   Anxiety 12/10/2018   Essential hypertension 09/18/2018   Hypercholesteremia 09/18/2018   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   Chronic anticoagulation (Plavix) 04/19/2018   Pain medication agreement broken ([facility]) 04/19/2018   Cocaine use 04/18/2018   Cocaine abuse (HCC) (See 04/12/18 UDS) 04/18/2018   Marijuana abuse (See 04/12/18 UDS) 04/18/2018   Long term current use of opiate analgesic 04/12/2018   Cirrhosis of liver (HCC) 04/03/2018   BPH (benign prostatic hyperplasia) 04/03/2018   Hematuria, microscopic 04/03/2018   Osteoarthritis of the knee (Left) 11/02/2017   Lumbar facet syndrome (Bilateral) 11/01/2017   Spondylosis without myelopathy or radiculopathy, lumbosacral region 11/01/2017   Osteoarthritis of shoulder (Right) 11/01/2017   Osteoarthritis of acromioclavicular joint (Right) 11/01/2017   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) 11/01/2017   Osteoarthritis 11/01/2017   Cervicalgia 11/01/2017   Cervical facet syndrome 11/01/2017   DDD (degenerative disc disease), cervical 11/01/2017   DDD (degenerative disc disease), lumbar 11/01/2017   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) 09/18/2017  Other chronic pain 09/18/2017   Chronic musculoskeletal pain 09/18/2017   Chronic pain of right knee  09/11/2017   Chronic low back pain (Secondary Area of Pain) (Bilateral) 09/11/2017   Chronic hip pain [facility] Area of Pain) (Bilateral) (R>L) 09/11/2017   Chronic shoulder pain (Fourth Area of Pain) (Right) 09/11/2017   Spondylosis without myelopathy or radiculopathy, cervical region 09/11/2017   Chronic pain syndrome 09/11/2017   Opiate use 09/11/2017   Screen for STD (sexually transmitted disease) 09/11/2017   Disorder of skeletal system 09/11/2017   Problems influencing health status 09/11/2017   Overdose of medication, intentional self-harm, sequela (HCC) 12/28/2016   Severe recurrent major depression without psychotic features (HCC) 12/27/2016   Alcohol use disorder, moderate, dependence (HCC) 12/27/2016   Cannabis use disorder, moderate, dependence (HCC) 12/27/2016   Chest pain 06/10/2016   Bipolar 1 disorder (HCC) 05/27/2013   Myalgia 05/27/2013   Abnormal ejaculation 11/30/2012   Chest pain, unspecified 11/30/2012   Degenerative arthritis of hip 11/16/2012   Schizoaffective disorder (HCC) 06/27/2012   COPD (chronic obstructive pulmonary disease) (HCC) 06/27/2012   Chronic hepatitis C (HCC) 06/27/2012   GERD (gastroesophageal reflux disease) 06/27/2012   Complete rupture of rotator cuff 02/10/2012   Cocaine abuse in remission (HCC) 12/05/2011   Unspecified osteoarthritis, unspecified site 07/27/2011     Current Outpatient Medications on File Prior to Visit  Medication Sig Dispense Refill   aspirin EC 81 MG tablet Take 1 tablet (81 mg total) by mouth daily. Swallow whole. 30 tablet 0   atorvastatin (LIPITOR) 80 MG tablet Take 1 tablet (80 mg total) by mouth daily. 30 tablet 0   celecoxib (CELEBREX) 200 MG capsule Take 1 capsule (200 mg total) by mouth 2 (two) times daily. 40 capsule 0   divalproex (DEPAKOTE) 500 MG DR tablet Take 1 tablet in the morning and 2 tablets at bedtime 90 tablet 2   DULoxetine (CYMBALTA) 30 MG capsule Take 3 capsules (90 mg total) by mouth daily.  90 capsule 2   gabapentin (NEURONTIN) 600 MG tablet Take 1 tablet (600 mg total) by mouth daily AND 2 tablets (1,200 mg total) at bedtime. 90 tablet 2   hydrOXYzine (ATARAX) 25 MG tablet Take 1 tablet (25 mg total) by mouth 2 (two) times daily as needed. 60 tablet 0   nitroGLYCERIN (NITROSTAT) 0.4 MG SL tablet Place 1 tablet (0.4 mg total) under the tongue every 5 (five) minutes as needed for chest pain. 30 tablet 0   oxyCODONE (OXY IR/ROXICODONE) 5 MG immediate release tablet Take 1-2 tablets (5-10 mg total) by mouth every 4 (four) hours as needed for moderate pain (pain score 4-6) (pain score 4-6). 30 tablet 0   QUEtiapine (SEROQUEL) 200 MG tablet Take 1 tablet (200 mg total) by mouth 2 (two) times daily. 60 tablet 2   traZODone (DESYREL) 100 MG tablet Take 1 tablet (100 mg total) by mouth at bedtime. 30 tablet 2   acetaminophen (TYLENOL) 325 MG tablet Take 1 tablet (325 mg total) by mouth every 6 (six) hours as needed for mild pain (pain score 1-3) (or temp > 100.5). (Patient not taking: Reported on 09/20/2023) 60 tablet 0   docusate sodium (COLACE) 100 MG capsule Take 1 capsule (100 mg total) by mouth 2 (two) times daily. (Patient not taking: Reported on 09/20/2023) 10 capsule 0   No current facility-administered medications on file prior to visit.    Allergies  Allergen Reactions   Asenapine Other (See Comments) and Nausea And Vomiting  Increased tremors   Dextromethorphan Hbr    Guaifenesin    Latuda [Lurasidone Hcl] Other (See Comments)    Tremors     Lurasidone Other (See Comments) and Hives    Other Reaction(s): Angioedema   Phenylephrine     Social History   Socioeconomic History   Marital status: Widowed    Spouse name: Not on file   Number of children: Not on file   Years of education: Not on file   Highest education level: GED or equivalent  Occupational History   Occupation: disability   Occupation: stocking at gas station    Comment: 2 days per week  Tobacco Use    Smoking status: Some Days    Current packs/day: 0.00    Average packs/day: 0.5 packs/day for 32.0 years (16.0 ttl pk-yrs)    Types: Cigarettes    Start date: 10/25/1989    Last attempt to quit: 10/25/2021    Years since quitting: 1.9    Passive exposure: Current   Smokeless tobacco: Never   Tobacco comments:     Quit 06/2023  Vaping Use   Vaping status: Never Used  Substance and Sexual Activity   Alcohol use: Not Currently    Alcohol/week: 3.0 standard drinks of alcohol    Types: 3 Cans of beer per week    Comment: occassionally    Drug use: Not Currently    Types: Cocaine, Marijuana    Comment: last on saturday   Sexual activity: Not Currently    Partners: Female    Birth control/protection: None  Other Topics Concern   Not on file  Social History Narrative   Not on file   Social Drivers of Health   Financial Resource Strain: High Risk (09/19/2023)   Overall Financial Resource Strain (CARDIA)    Difficulty of Paying Living Expenses: Hard  Food Insecurity: Food Insecurity Present (09/19/2023)   Hunger Vital Sign    Worried About Running Out of Food in the Last Year: Often true    Ran Out of Food in the Last Year: Sometimes true  Transportation Needs: No Transportation Needs (09/19/2023)   PRAPARE - Administrator, Civil Service (Medical): No    Lack of Transportation (Non-Medical): No  Physical Activity: Unknown (09/19/2023)   Exercise Vital Sign    Days of Exercise per Week: 0 days    Minutes of Exercise per Session: Not on file  Stress: Stress Concern Present (09/19/2023)   [employer] of Occupational Health - Occupational Stress Questionnaire    Feeling of Stress : Rather much  Social Connections: Moderately Isolated (09/19/2023)   Social Connection and Isolation Panel [NHANES]    Frequency of Communication with Friends and Family: More than three times a week    Frequency of Social Gatherings with Friends and Family: More than three times a week     Attends Religious Services: More than 4 times per year    Active Member of [employer] or Organizations: No    Attends Banker Meetings: Not on file    Marital Status: Widowed  Intimate Partner Violence: Not At Risk (09/01/2023)   Humiliation, Afraid, Rape, and Kick questionnaire    Fear of Current or Ex-Partner: No    Emotionally Abused: No    Physically Abused: No    Sexually Abused: No    Family History  Problem Relation Age of Onset   Osteoarthritis Mother    Heart disease Mother    Hypertension Mother  Depression Mother    Heart disease Father    Early death Father    Hypertension Father    Heart attack Father    Hypertension Sister    Parkinson's disease Maternal Grandfather    Prostate cancer Neg Hx    Bladder Cancer Neg Hx    Kidney cancer Neg Hx    Tremor Neg Hx     Past Surgical History:  Procedure Laterality Date   ABDOMINAL SURGERY     removed small piece of intestines due to [facility] Diverticulosis   ANTERIOR LAT LUMBAR FUSION N/A 08/23/2022   Procedure: DIRECT LATERAL INTERBODY FUSION  LUMBAR TWO- LUMBAR THREE, LUMBAR THREE-LUMBAR FOUR , EXPLORE AND EXTEND FUSION LUMBAR TWO-LUMBAR FIVE, POSTERIOR DECOMPRESSION LUMBAR THREE-LUMBAR FOUR, LEFT LUMBAR TWO-LUMBAR THREE;  Surgeon: Bedelia Person, MD;  Location: [facility] OR;  Service: Neurosurgery;  Laterality: N/A;   APPENDECTOMY     BACK SURGERY     CARDIAC CATHETERIZATION Left 06/10/2016   Procedure: Left Heart Cath and Coronary Angiography;  Surgeon: Laurier Nancy, MD;  Location: [facility] INVASIVE CV LAB;  Service: Cardiovascular;  Laterality: Left;   CARDIAC CATHETERIZATION N/A 06/10/2016   Procedure: Coronary Stent Intervention;  Surgeon: Alwyn Pea, MD;  Location: [facility] INVASIVE CV LAB;  Service: Cardiovascular;  Laterality: N/A;   CHOLECYSTECTOMY N/A 02/17/2022   Procedure: LAPAROSCOPIC CHOLECYSTECTOMY;  Surgeon: Sheliah Hatch De Blanch, MD;  Location: [facility] OR;  Service: General;  Laterality: N/A;   COLON  SURGERY     COLONOSCOPY     COLONOSCOPY WITH PROPOFOL N/A 01/05/2017   Procedure: COLONOSCOPY WITH PROPOFOL;  Surgeon: Wyline Mood, MD;  Location: [facility] ENDOSCOPY;  Service: Endoscopy;  Laterality: N/A;   COLONOSCOPY WITH PROPOFOL N/A 02/13/2020   Procedure: COLONOSCOPY WITH PROPOFOL;  Surgeon: Pasty Spillers, MD;  Location: [facility] ENDOSCOPY;  Service: Endoscopy;  Laterality: N/A;   CORONARY ANGIOPLASTY WITH STENT PLACEMENT     CORONARY PRESSURE/FFR STUDY N/A 12/04/2019   Procedure: INTRAVASCULAR PRESSURE WIRE/FFR STUDY;  Surgeon: Tonny Bollman, MD;  Location: [facility] INVASIVE CV LAB;  Service: Cardiovascular;  Laterality: N/A;   ESOPHAGOGASTRODUODENOSCOPY (EGD) WITH PROPOFOL N/A 01/05/2017   Procedure: ESOPHAGOGASTRODUODENOSCOPY (EGD) WITH PROPOFOL;  Surgeon: Wyline Mood, MD;  Location: [facility] ENDOSCOPY;  Service: Endoscopy;  Laterality: N/A;   ESOPHAGOGASTRODUODENOSCOPY (EGD) WITH PROPOFOL N/A 02/13/2020   Procedure: ESOPHAGOGASTRODUODENOSCOPY (EGD) WITH PROPOFOL;  Surgeon: Pasty Spillers, MD;  Location: [facility] ENDOSCOPY;  Service: Endoscopy;  Laterality: N/A;   KNEE ARTHROSCOPY WITH MEDIAL MENISECTOMY Right 09/05/2017   Procedure: KNEE ARTHROSCOPY WITH MEDIAL AND LATERAL  MENISECTOMY PARTIAL SYNOVECTOMY;  Surgeon: Kennedy Bucker, MD;  Location: [facility] ORS;  Service: Orthopedics;  Laterality: Right;   LEFT HEART CATH AND CORONARY ANGIOGRAPHY N/A 12/04/2019   Procedure: LEFT HEART CATH AND CORONARY ANGIOGRAPHY;  Surgeon: Tonny Bollman, MD;  Location: [facility] INVASIVE CV LAB;  Service: Cardiovascular;  Laterality: N/A;   LEFT HEART CATH AND CORONARY ANGIOGRAPHY Left 11/25/2022   Procedure: LEFT HEART CATH AND CORONARY ANGIOGRAPHY;  Surgeon: Laurier Nancy, MD;  Location: [facility] INVASIVE CV LAB;  Service: Cardiovascular;  Laterality: Left;   REVERSE SHOULDER ARTHROPLASTY Right 08/31/2023   Procedure: RIGHT REVERSE SHOULDER ARTHROPLASTY;  Surgeon: Cammy Copa, MD;  Location[facility] OR;  Service:  Orthopedics;  Laterality: Right;   SHOULDER SURGERY Right 04/09/2012   SPINE SURGERY     TOTAL KNEE ARTHROPLASTY Right 01/19/2021   Procedure: TOTAL KNEE ARTHROPLASTY - [place] Neighbor to Assist;  Surgeon: Kennedy Bucker, MD;  Location: [facility] ORS;  Service:  Orthopedics;  Laterality: Right;    ROS: Review of Systems Negative except as stated above  PHYSICAL EXAM: BP 124/77   Pulse (!) 55   Temp (!) 97.5 F (36.4 C) (Oral)   Ht 6\' 3"  (1.905 m)   Wt 228 lb 9.6 oz (103.7 kg)   SpO2 95%   BMI 28.57 kg/m   Physical Exam HENT:     Head: Normocephalic and atraumatic.     Nose: Nose normal.     Mouth/Throat:     Mouth: Mucous membranes are moist.     Pharynx: Oropharynx is clear.  Eyes:     Extraocular Movements: Extraocular movements intact.     Conjunctiva/sclera: Conjunctivae normal.     Pupils: Pupils are equal, round, and reactive to light.  Cardiovascular:     Rate and Rhythm: Bradycardia present.     Pulses: Normal pulses.     Heart sounds: Normal heart sounds.  Pulmonary:     Effort: Pulmonary effort is normal.     Breath sounds: Normal breath sounds.  Musculoskeletal:        General: Normal range of motion.     Cervical back: Normal range of motion and neck supple.  Neurological:     General: No focal deficit present.     Mental Status: He is alert and oriented to person, place, and time.  Psychiatric:        Mood and Affect: Mood normal.        Behavior: Behavior normal.     ASSESSMENT AND PLAN: 1. Chronic obstructive pulmonary disease, unspecified COPD type (HCC) (Primary) 2. Panlobular emphysema (HCC) - Continue Albuterol inhaler and Budeson-Glycopyrrol-Formoterol inhaler as prescribed. Counseled on medication adherence/adverse effects. - Referral to Pulmonology for evaluation/management.  - Follow-up with primary provider as scheduled. - Ambulatory referral to Pulmonology - albuterol (VENTOLIN HFA) 108 (90 Base) MCG/ACT inhaler; Inhale 2 puffs into the lungs  every 6 (six) hours as needed for wheezing or shortness of breath.  Dispense: 18 g; Refill: 2 - Budeson-Glycopyrrol-Formoterol (BREZTRI AEROSPHERE) 160-9-4.8 MCG/ACT AERO; Inhale 2 puffs into the lungs in the morning and at bedtime.  Dispense: 1 each; Refill: 2  3. Gastroesophageal reflux disease, unspecified whether esophagitis present - Continue Pantoprazole as prescribed. Counseled on medication adherence/adverse effects. - Follow-up with primary provider as scheduled. - pantoprazole (PROTONIX) 40 MG tablet; Take 1 tablet (40 mg total) by mouth daily.  Dispense: 90 tablet; Refill: 0  4. History of BPH - Referral to Urology for evaluation/management. - Ambulatory referral to Urology  Patient was given the opportunity to ask questions.  Patient verbalized understanding of the plan and was able to repeat key elements of the plan. Patient was given clear instructions to go to Emergency Department or return to medical center if symptoms don't improve, worsen, or new problems develop.The patient verbalized understanding.   Orders Placed This Encounter  Procedures   Ambulatory referral to Urology   Ambulatory referral to Pulmonology     Requested Prescriptions   Signed Prescriptions Disp Refills   albuterol (VENTOLIN HFA) 108 (90 Base) MCG/ACT inhaler 18 g 2    Sig: Inhale 2 puffs into the lungs every 6 (six) hours as needed for wheezing or shortness of breath.   Budeson-Glycopyrrol-Formoterol (BREZTRI AEROSPHERE) 160-9-4.8 MCG/ACT AERO 1 each 2    Sig: Inhale 2 puffs into the lungs in the morning and at bedtime.   pantoprazole (PROTONIX) 40 MG tablet 90 tablet 0    Sig: Take 1 tablet (40 mg  total) by mouth daily.    Follow-up with primary provider as scheduled.  Rema Fendt, NP

## 2023-09-20 NOTE — Therapy (Unsigned)
 OUTPATIENT PHYSICAL THERAPY SHOULDER EVALUATION   Patient Name: Keith Gibbs MRN: [id number] DOB:03-08-1961, 63 y.o., male Today's Date: 09/21/2023  END OF SESSION:  PT End of Session - 09/21/23 0932     Visit Number 1    Number of Visits 17    Date for PT Re-Evaluation 11/16/23    Authorization Type [employer]    PT Start Time 0926    PT Stop Time 1005    PT Time Calculation (min) 39 min    Activity Tolerance Patient tolerated treatment well    Behavior During Therapy WFL for tasks assessed/performed             Past Medical History:  Diagnosis Date   Anginal pain (HCC)    Anxiety    Arthritis    Bipolar 1 disorder (HCC)    Bursitis    CAD (coronary artery disease)    Chronic pain    COPD (chronic obstructive pulmonary disease) (HCC)    Current use of long term anticoagulation    DAPT (ASA + clopidogrel)   Depression    Diverticulitis    Dyspnea    GERD (gastroesophageal reflux disease)    Grade I diastolic dysfunction    Hepatitis C 2012   No longer has Hep C   HLD (hyperlipidemia)    Hypertension    MI (myocardial infarction) (HCC)    Polysubstance abuse (HCC)    cocaine, marijuana, ETOH   PUD (peptic ulcer disease)    S/P angioplasty with stent 06/10/2016   a.) 90% stenosis of pLAD to mLAD - 2.5 x 18 mm Xience Alpine (DES x 1) placed to pLAD   S/P PTCA (percutaneous transluminal coronary angioplasty) 12/04/2019   a.) 60% in stent restenosis of DES to pLAD; LVEF 65%.   Schizophrenia (HCC)    Stroke [facility])    Valvular insufficiency    a.) Mild MR, TR, PR; mild to moderate AR on 03/05/2018 TTE   Past Surgical History:  Procedure Laterality Date   ABDOMINAL SURGERY     removed small piece of intestines due to [facility] Diverticulosis   ANTERIOR LAT LUMBAR FUSION N/A 08/23/2022   Procedure: DIRECT LATERAL INTERBODY FUSION  LUMBAR TWO- LUMBAR THREE, LUMBAR THREE-LUMBAR FOUR , EXPLORE AND EXTEND FUSION LUMBAR TWO-LUMBAR FIVE, POSTERIOR DECOMPRESSION  LUMBAR THREE-LUMBAR FOUR, LEFT LUMBAR TWO-LUMBAR THREE;  Surgeon: Bedelia Person, MD;  Location: [facility] OR;  Service: Neurosurgery;  Laterality: N/A;   APPENDECTOMY     BACK SURGERY     CARDIAC CATHETERIZATION Left 06/10/2016   Procedure: Left Heart Cath and Coronary Angiography;  Surgeon: Laurier Nancy, MD;  Location: [facility] INVASIVE CV LAB;  Service: Cardiovascular;  Laterality: Left;   CARDIAC CATHETERIZATION N/A 06/10/2016   Procedure: Coronary Stent Intervention;  Surgeon: Alwyn Pea, MD;  Location: [facility] INVASIVE CV LAB;  Service: Cardiovascular;  Laterality: N/A;   CHOLECYSTECTOMY N/A 02/17/2022   Procedure: LAPAROSCOPIC CHOLECYSTECTOMY;  Surgeon: Sheliah Hatch De Blanch, MD;  Location: [facility] OR;  Service: General;  Laterality: N/A;   COLON SURGERY     COLONOSCOPY     COLONOSCOPY WITH PROPOFOL N/A 01/05/2017   Procedure: COLONOSCOPY WITH PROPOFOL;  Surgeon: Wyline Mood, MD;  Location: [facility] ENDOSCOPY;  Service: Endoscopy;  Laterality: N/A;   COLONOSCOPY WITH PROPOFOL N/A 02/13/2020   Procedure: COLONOSCOPY WITH PROPOFOL;  Surgeon: Pasty Spillers, MD;  Location: [facility] ENDOSCOPY;  Service: Endoscopy;  Laterality: N/A;   CORONARY ANGIOPLASTY WITH STENT PLACEMENT     CORONARY PRESSURE/FFR STUDY  N/A 12/04/2019   Procedure: INTRAVASCULAR PRESSURE WIRE/FFR STUDY;  Surgeon: Tonny Bollman, MD;  Location: [facility] INVASIVE CV LAB;  Service: Cardiovascular;  Laterality: N/A;   ESOPHAGOGASTRODUODENOSCOPY (EGD) WITH PROPOFOL N/A 01/05/2017   Procedure: ESOPHAGOGASTRODUODENOSCOPY (EGD) WITH PROPOFOL;  Surgeon: Wyline Mood, MD;  Location: [facility] ENDOSCOPY;  Service: Endoscopy;  Laterality: N/A;   ESOPHAGOGASTRODUODENOSCOPY (EGD) WITH PROPOFOL N/A 02/13/2020   Procedure: ESOPHAGOGASTRODUODENOSCOPY (EGD) WITH PROPOFOL;  Surgeon: Pasty Spillers, MD;  Location: [facility] ENDOSCOPY;  Service: Endoscopy;  Laterality: N/A;   KNEE ARTHROSCOPY WITH MEDIAL MENISECTOMY Right 09/05/2017   Procedure: KNEE  ARTHROSCOPY WITH MEDIAL AND LATERAL  MENISECTOMY PARTIAL SYNOVECTOMY;  Surgeon: Kennedy Bucker, MD;  Location: [facility] ORS;  Service: Orthopedics;  Laterality: Right;   LEFT HEART CATH AND CORONARY ANGIOGRAPHY N/A 12/04/2019   Procedure: LEFT HEART CATH AND CORONARY ANGIOGRAPHY;  Surgeon: Tonny Bollman, MD;  Location: [facility] INVASIVE CV LAB;  Service: Cardiovascular;  Laterality: N/A;   LEFT HEART CATH AND CORONARY ANGIOGRAPHY Left 11/25/2022   Procedure: LEFT HEART CATH AND CORONARY ANGIOGRAPHY;  Surgeon: Laurier Nancy, MD;  Location: [facility] INVASIVE CV LAB;  Service: Cardiovascular;  Laterality: Left;   REVERSE SHOULDER ARTHROPLASTY Right 08/31/2023   Procedure: RIGHT REVERSE SHOULDER ARTHROPLASTY;  Surgeon: Cammy Copa, MD;  Location: [facility] OR;  Service: Orthopedics;  Laterality: Right;   SHOULDER SURGERY Right 04/09/2012   SPINE SURGERY     TOTAL KNEE ARTHROPLASTY Right 01/19/2021   Procedure: TOTAL KNEE ARTHROPLASTY - [place] Neighbor to Assist;  Surgeon: Kennedy Bucker, MD;  Location: [facility] ORS;  Service: Orthopedics;  Laterality: Right;   Patient Active Problem List   Diagnosis Date Noted   Shoulder arthritis 08/31/2023   Lumbar radiculopathy 05/30/2023   Major depressive disorder, recurrent severe without psychotic features (HCC) 01/28/2023   S/P lumbar fusion 08/23/2022   Postlaminectomy syndrome, not elsewhere classified 08/12/2022   MDD (major depressive disorder) 08/10/2022   Substance induced mood disorder (HCC) 08/06/2022   Suicide attempt by drug overdose (HCC) 08/06/2022   Toxic encephalopathy 08/03/2022   Suicide ideation 08/03/2022   Encephalopathy 08/03/2022   Polysubstance abuse (HCC) 04/28/2022   Postoperative abdominal pain 02/28/2022   Acute cholecystitis 02/17/2022   Obstruction of nasal valve 01/07/2022   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   Pulmonary nodule 12/17/2021   CAD S/P percutaneous coronary angioplasty 12/17/2021   Epigastric abdominal pain    Nausea and vomiting     Tobacco dependence due to cigarettes 08/31/2021   Iron deficiency anemia secondary to inadequate dietary iron intake 08/31/2021   Acute lead-induced gout involving toe of left foot 08/31/2021   Encounter for lipid screening for cardiovascular disease 08/31/2021   Hypotension due to drugs 06/21/2021   Opioid withdrawal (HCC) 06/21/2021   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HCC) 06/21/2021   RLS (restless legs syndrome) 05/07/2021   Acquired deviated nasal septum 05/03/2021   Encounter for surveillance of abnormal nevi 05/03/2021   Flu vaccine need 05/03/2021   Substance abuse (HCC) 05/03/2021   Drug abuse and dependence (HCC) 05/03/2021   Need for shingles vaccine 05/03/2021   Right groin pain 01/29/2021   S/P TKR (total knee replacement) using cement, right 01/19/2021   Cigarette smoker 07/21/2020   Foot pain, bilateral 12/25/2019   Potential exposure to STD 12/12/2019   History of lumbar surgery 12/12/2019   Fatigue 12/12/2019   Penile lesion 12/12/2019   Right testicular pain 12/12/2019   Body mass index 28.0-28.9, adult 12/12/2019   History of COPD 12/12/2019   Tobacco use disorder, continuous 12/12/2019  Hepatoma (HCC) 11/05/2019   Anxiety 12/10/2018   Essential hypertension 09/18/2018   Hypercholesteremia 09/18/2018   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   Chronic anticoagulation (Plavix) 04/19/2018   Pain medication agreement broken ([facility]) 04/19/2018   Cocaine use 04/18/2018   Cocaine abuse (HCC) (See 04/12/18 UDS) 04/18/2018   Marijuana abuse (See 04/12/18 UDS) 04/18/2018   Long term current use of opiate analgesic 04/12/2018   Cirrhosis of liver (HCC) 04/03/2018   BPH (benign prostatic hyperplasia) 04/03/2018   Hematuria, microscopic 04/03/2018   Osteoarthritis of the knee (Left) 11/02/2017   Lumbar facet syndrome (Bilateral) 11/01/2017   Spondylosis without myelopathy or radiculopathy, lumbosacral region 11/01/2017   Osteoarthritis of shoulder (Right) 11/01/2017    Osteoarthritis of acromioclavicular joint (Right) 11/01/2017   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) 11/01/2017   Osteoarthritis 11/01/2017   Cervicalgia 11/01/2017   Cervical facet syndrome 11/01/2017   DDD (degenerative disc disease), cervical 11/01/2017   DDD (degenerative disc disease), lumbar 11/01/2017   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) 09/18/2017   Other chronic pain 09/18/2017   Chronic musculoskeletal pain 09/18/2017   Chronic pain of right knee 09/11/2017   Chronic low back pain (Secondary Area of Pain) (Bilateral) 09/11/2017   Chronic hip pain (Tertiary Area of Pain) (Bilateral) (R>L) 09/11/2017   Chronic shoulder pain (Fourth Area of Pain) (Right) 09/11/2017   Spondylosis without myelopathy or radiculopathy, cervical region 09/11/2017   Chronic pain syndrome 09/11/2017   Opiate use 09/11/2017   Screen for STD (sexually transmitted disease) 09/11/2017   Disorder of skeletal system 09/11/2017   Problems influencing health status 09/11/2017   Overdose of medication, intentional self-harm, sequela (HCC) 12/28/2016   Severe recurrent major depression without psychotic features (HCC) 12/27/2016   Alcohol use disorder, moderate, dependence (HCC) 12/27/2016   Cannabis use disorder, moderate, dependence (HCC) 12/27/2016   Chest pain 06/10/2016   Bipolar 1 disorder (HCC) 05/27/2013   Myalgia 05/27/2013   Abnormal ejaculation 11/30/2012   Chest pain, unspecified 11/30/2012   Degenerative arthritis of hip 11/16/2012   Schizoaffective disorder (HCC) 06/27/2012   COPD (chronic obstructive pulmonary disease) (HCC) 06/27/2012   Chronic hepatitis C (HCC) 06/27/2012   GERD (gastroesophageal reflux disease) 06/27/2012   Complete rupture of rotator cuff 02/10/2012   Cocaine abuse in remission (HCC) 12/05/2011   Unspecified osteoarthritis, unspecified site 07/27/2011    PCP: Rema Fendt NP   REFERRING PROVIDER: Julieanne Cotton, PA-C  REFERRING DIAG:  M25.511 (ICD-10-CM) - Acute pain of right shoulder Z96.611 (ICD-10-CM) - History of arthroplasty of right shoulder  THERAPY DIAG:  S/P reverse total shoulder arthroplasty, right  Muscle weakness (generalized)  Stiffness of right shoulder, not elsewhere classified  Rationale for Evaluation and Treatment: Rehabilitation  ONSET DATE: 08/31/2023   SUBJECTIVE:  SUBJECTIVE STATEMENT: Pt underwent Rt RTSR on 08/31/23.  Patient recently saw the doctor who was pleased with his progress so far.  Pt reports frustration with his physical limitations. He is not working right now, disabled.  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  Patient reports his pain limits his ability to sleep, work on his motorcycle and other home tasks.  He recently discontinued the use of the sling but does still use pillows to help get comfortable.  He does report couple of days ago he tripped while walking up the stairs and had to catch himself on both hands.  Hand dominance: Right  PERTINENT HISTORY: Patient has had multiple surgeries including lumbar fusion, knee arthroscopy, osteoarthritis COPD and cardiac stent. Substance abuse.   PAIN:  Are you having pain? Yes: NPRS scale: 8/10 Pain location: Rt ant shoulder  Pain description: sore and achy Aggravating factors: constant , worse with using it  Relieving factors: rest, meds, ice   PRECAUTIONS: Other: protocol: no ER past 30 deg  RED FLAGS: None   WEIGHT BEARING RESTRICTIONS: No  FALLS:  Has patient fallen in last 6 months? Yes, 1 time yesterday, walking up the stairs  LIVING ENVIRONMENT: Lives with: lives with their familymoved back in with his mom after being widowed  Lives in: House/apartment Stairs: Yes: External: no issues  steps; NA  Has following equipment at home:  None  OCCUPATION: Disabled  PLOF: Independent with basic ADLs, Vocation/Vocational requirements: NA, and Leisure: likes to use his hands , takes his motorcycle out and paint   PATIENT GOALS:I would like to ride my motorcycle again.   NEXT MD VISIT:   OBJECTIVE:  Note: Objective measures were completed at Evaluation unless otherwise noted.  DIAGNOSTIC FINDINGS:  None   PATIENT SURVEYS:  Quick Dash 40/55  COGNITION: Overall cognitive status: Within functional limits for tasks assessed     SENSATION: WFL  POSTURE: Rounded shoulders, elbow flexed   UPPER EXTREMITY ROM:   Active ROM Right eval Left eval  Shoulder flexion 110 160  Shoulder extension    Shoulder abduction 90 Full   Shoulder adduction    Shoulder internal rotation PROM 50  Full   Shoulder external rotation PROM 40  Full   Elbow flexion Full    Elbow extension -30   Wrist flexion    Wrist extension    Wrist ulnar deviation    Wrist radial deviation    Wrist pronation    Wrist supination    (Blank rows = not tested)  UPPER EXTREMITY MMT:Nt on Rt GH joint, appears 3-/5   MMT Right eval Left Eval WNL   Shoulder flexion    Shoulder extension    Shoulder abduction    Shoulder adduction    Shoulder internal rotation    Shoulder external rotation    Middle trapezius    Lower trapezius    Elbow flexion 4   Elbow extension 4-   Wrist flexion    Wrist extension    Wrist ulnar deviation    Wrist radial deviation    Wrist pronation    Wrist supination    Grip strength (lbs)    (Blank rows = not tested)  PALPATION:  Steristrips in place, pain along anterior aspect of shoulder , some laterally and along clavicle  TREATMENT DATE:  [facility] Adult PT Treatment:                                                DATE: 09/21/23  Self Care: HEP, protocol, precautions  - no charge     PATIENT EDUCATION: Education details: Precautions, HEP  and AAROM, POC  Person educated: Patient Education method: Explanation, Demonstration, Verbal cues, and Handouts Education comprehension: verbalized understanding, returned demonstration, and needs further education  HOME EXERCISE PROGRAM: Access Code: [email or web address] URL: [email or web address] Date: 09/21/2023 Prepared by: Karie Mainland  Exercises - Isometric Shoulder Abduction at Wall  - 1-3 x daily - 7 x weekly - 2 sets - 10 reps - 5 hold - Standing Scapular Retraction  - 1-3 x daily - 7 x weekly - 2 sets - 10 reps - 5 hold - Supine Shoulder Flexion Extension AAROM with Dowel  - 1-3 x daily - 7 x weekly - 2 sets - 10 reps - 5 hold  ASSESSMENT:  CLINICAL IMPRESSION: Patient is a 63 y.o. male who was seen today for physical therapy evaluation and treatment for Reverse TSR on 08/31/23.  Overall patient is doing quite well despite reported moderate to severe pain on a constant basis.  Patient will benefit from skilled physical therapy to work on progressive improvements in range of motion strength and overall mobility.  OBJECTIVE IMPAIRMENTS: decreased ROM, decreased strength, hypomobility, increased fascial restrictions, impaired UE functional use, postural dysfunction, and pain.   ACTIVITY LIMITATIONS: carrying, lifting, sleeping, reach over head, hygiene/grooming, and locomotion level  PARTICIPATION LIMITATIONS: cleaning, driving, community activity, and recreation   PERSONAL FACTORS: 3+ comorbidities: Rt elbow stiffness, lumbar surgeries, chronic pain   are also affecting patient's functional outcome.   REHAB POTENTIAL: Excellent  CLINICAL DECISION MAKING: Stable/uncomplicated  EVALUATION COMPLEXITY: Low   GOALS: Goals reviewed with patient? Yes  SHORT TERM GOALS: Target date: 10/19/2023    Patient will be independent with initial home exercise program for active range of motion and strength Baseline:  Given on evaluation Goal status: INITIAL  2.  Patient will be able to complete ADLs without increased pain including bathing dressing and hygiene Baseline: Pain is moderate to severe Goal status: INITIAL  3.  Patient will require min to no cueing to reduce compensation and shoulder height with shoulder flexion and abduction Baseline:  Goal status: INITIAL  LONG TERM GOALS: Target date: 11/16/2023    Patient will be independent with home exercise program upon discharge Baseline:  Goal status: INITIAL  2.  Patient will demonstrate 4/5 shoulder strength in order to resume recreational activities Baseline:  Goal status: INITIAL  3.  Patient will demonstrate full active range of motion in all planes with pain less than a 4 out of 10 Baseline:  Goal status: INITIAL  4.  Patient will be able to return to painting and mechanical work on his motorcycle without increasing pain Baseline:  Goal status: INITIAL  5.  QuickDASH score will improve by 10 point as a proxy for improved shoulder function Baseline: 39/55 Goal status: INITIAL   PLAN:  PT FREQUENCY: 2x/week  PT DURATION: 8 weeks  PLANNED INTERVENTIONS: 97164- PT Re-evaluation, 97110-Therapeutic exercises, 97530- Therapeutic activity, 97112- Neuromuscular re-education, 97535- Self Care, 78295- Manual therapy, 97016- Vasopneumatic device, Patient/Family education, Joint mobilization, Cryotherapy, and Moist heat  PLAN FOR NEXT SESSION: Patient is allowed to begin  deltoid isometrics follow protocol increasing shoulder passive range of motion and progressing into active range of motion modalities as needed for pain, manual    Daveigh Batty, PT 09/21/2023, 10:08 AM   Karie Mainland, PT 09/21/23 3:59 PM Phone: [phone] Fax: [phone]   For all possible CPT codes, reference the Planned Interventions line above.     Check all conditions that are expected to impact treatment: {Conditions expected to impact  treatment:Contractures, spasticity or fracture relevant to requested treatment and Social determinants of health   If treatment provided at initial evaluation, no treatment charged due to lack of authorization.     Karie Mainland, PT 09/21/23 3:59 PM Phone: [phone] Fax: [phone]

## 2023-09-21 ENCOUNTER — Encounter: Payer: Self-pay | Admitting: Physical Therapy

## 2023-09-21 ENCOUNTER — Ambulatory Visit: Payer: MEDICAID | Attending: Surgical | Admitting: Physical Therapy

## 2023-09-21 DIAGNOSIS — M25511 Pain in right shoulder: Secondary | ICD-10-CM | POA: Insufficient documentation

## 2023-09-21 DIAGNOSIS — Z96611 Presence of right artificial shoulder joint: Secondary | ICD-10-CM | POA: Diagnosis present

## 2023-09-21 DIAGNOSIS — M25611 Stiffness of right shoulder, not elsewhere classified: Secondary | ICD-10-CM | POA: Insufficient documentation

## 2023-09-21 DIAGNOSIS — M6281 Muscle weakness (generalized): Secondary | ICD-10-CM | POA: Diagnosis present

## 2023-09-22 ENCOUNTER — Telehealth: Payer: Self-pay | Admitting: Orthopedic Surgery

## 2023-09-22 ENCOUNTER — Other Ambulatory Visit: Payer: Self-pay | Admitting: Surgical

## 2023-09-22 MED ORDER — OXYCODONE HCL 5 MG PO TABS: 5.0000 mg | ORAL_TABLET | Freq: Four times a day (QID) | ORAL | 0 refills | Status: DC | PRN

## 2023-09-22 NOTE — Telephone Encounter (Signed)
 Sent in

## 2023-09-22 NOTE — Telephone Encounter (Signed)
 Patient called needing Rx refilled Oxycodone. Patient asked for a call when Rx is sent to the pharmacy. The number to contact patient is [phone]

## 2023-09-28 ENCOUNTER — Encounter: Payer: Self-pay | Admitting: Cardiovascular Disease

## 2023-09-28 ENCOUNTER — Ambulatory Visit: Payer: MEDICAID | Admitting: Cardiovascular Disease

## 2023-09-28 VITALS — BP 137/76 | HR 55 | Ht 75.0 in | Wt 229.0 lb

## 2023-09-28 DIAGNOSIS — I10 Essential (primary) hypertension: Secondary | ICD-10-CM | POA: Diagnosis not present

## 2023-09-28 DIAGNOSIS — Z9861 Coronary angioplasty status: Secondary | ICD-10-CM

## 2023-09-28 DIAGNOSIS — K7469 Other cirrhosis of liver: Secondary | ICD-10-CM

## 2023-09-28 DIAGNOSIS — F1020 Alcohol dependence, uncomplicated: Secondary | ICD-10-CM | POA: Diagnosis not present

## 2023-09-28 DIAGNOSIS — I2510 Atherosclerotic heart disease of native coronary artery without angina pectoris: Secondary | ICD-10-CM | POA: Diagnosis not present

## 2023-09-28 DIAGNOSIS — K219 Gastro-esophageal reflux disease without esophagitis: Secondary | ICD-10-CM | POA: Diagnosis not present

## 2023-09-28 NOTE — Progress Notes (Signed)
 Cardiology Office Note   Date:  09/28/2023   ID:  Keith Gibbs, DOB [birth year], MRN [id number]  PCP:  Rema Fendt, NP  Cardiologist:  Adrian Blackwater, MD      History of Present Illness: Keith Gibbs is a 63 y.o. male who presents for No chief complaint on file.   Had left shoulder replacement      Past Medical History:  Diagnosis Date   Anginal pain (HCC)    Anxiety    Arthritis    Bipolar 1 disorder (HCC)    Bursitis    CAD (coronary artery disease)    Chronic pain    COPD (chronic obstructive pulmonary disease) (HCC)    Current use of long term anticoagulation    DAPT (ASA + clopidogrel)   Depression    Diverticulitis    Dyspnea    GERD (gastroesophageal reflux disease)    Grade I diastolic dysfunction    Hepatitis C 2012   No longer has Hep C   HLD (hyperlipidemia)    Hypertension    MI (myocardial infarction) (HCC)    Polysubstance abuse (HCC)    cocaine, marijuana, ETOH   PUD (peptic ulcer disease)    S/P angioplasty with stent 06/10/2016   a.) 90% stenosis of pLAD to mLAD - 2.5 x 18 mm Xience Alpine (DES x 1) placed to pLAD   S/P PTCA (percutaneous transluminal coronary angioplasty) 12/04/2019   a.) 60% in stent restenosis of DES to pLAD; LVEF 65%.   Schizophrenia (HCC)    Stroke [facility])    Valvular insufficiency    a.) Mild MR, TR, PR; mild to moderate AR on 03/05/2018 TTE     Past Surgical History:  Procedure Laterality Date   ABDOMINAL SURGERY     removed small piece of intestines due to [facility] Diverticulosis   ANTERIOR LAT LUMBAR FUSION N/A 08/23/2022   Procedure: DIRECT LATERAL INTERBODY FUSION  LUMBAR TWO- LUMBAR THREE, LUMBAR THREE-LUMBAR FOUR , EXPLORE AND EXTEND FUSION LUMBAR TWO-LUMBAR FIVE, POSTERIOR DECOMPRESSION LUMBAR THREE-LUMBAR FOUR, LEFT LUMBAR TWO-LUMBAR THREE;  Surgeon: Bedelia Person, MD;  Location: [facility] OR;  Service: Neurosurgery;  Laterality: N/A;   APPENDECTOMY     BACK SURGERY     CARDIAC CATHETERIZATION Left  06/10/2016   Procedure: Left Heart Cath and Coronary Angiography;  Surgeon: Laurier Nancy, MD;  Location: [facility] INVASIVE CV LAB;  Service: Cardiovascular;  Laterality: Left;   CARDIAC CATHETERIZATION N/A 06/10/2016   Procedure: Coronary Stent Intervention;  Surgeon: Alwyn Pea, MD;  Location: [facility] INVASIVE CV LAB;  Service: Cardiovascular;  Laterality: N/A;   CHOLECYSTECTOMY N/A 02/17/2022   Procedure: LAPAROSCOPIC CHOLECYSTECTOMY;  Surgeon: Sheliah Hatch De Blanch, MD;  Location: [facility] OR;  Service: General;  Laterality: N/A;   COLON SURGERY     COLONOSCOPY     COLONOSCOPY WITH PROPOFOL N/A 01/05/2017   Procedure: COLONOSCOPY WITH PROPOFOL;  Surgeon: Wyline Mood, MD;  Location: [facility] ENDOSCOPY;  Service: Endoscopy;  Laterality: N/A;   COLONOSCOPY WITH PROPOFOL N/A 02/13/2020   Procedure: COLONOSCOPY WITH PROPOFOL;  Surgeon: Pasty Spillers, MD;  Location: [facility] ENDOSCOPY;  Service: Endoscopy;  Laterality: N/A;   CORONARY ANGIOPLASTY WITH STENT PLACEMENT     CORONARY PRESSURE/FFR STUDY N/A 12/04/2019   Procedure: INTRAVASCULAR PRESSURE WIRE/FFR STUDY;  Surgeon: Tonny Bollman, MD;  Location: [facility] INVASIVE CV LAB;  Service: Cardiovascular;  Laterality: N/A;   ESOPHAGOGASTRODUODENOSCOPY (EGD) WITH PROPOFOL N/A 01/05/2017   Procedure: ESOPHAGOGASTRODUODENOSCOPY (EGD) WITH PROPOFOL;  Surgeon: Tobi Bastos,  Sharlet Salina, MD;  Location: [facility] ENDOSCOPY;  Service: Endoscopy;  Laterality: N/A;   ESOPHAGOGASTRODUODENOSCOPY (EGD) WITH PROPOFOL N/A 02/13/2020   Procedure: ESOPHAGOGASTRODUODENOSCOPY (EGD) WITH PROPOFOL;  Surgeon: Pasty Spillers, MD;  Location: [facility] ENDOSCOPY;  Service: Endoscopy;  Laterality: N/A;   KNEE ARTHROSCOPY WITH MEDIAL MENISECTOMY Right 09/05/2017   Procedure: KNEE ARTHROSCOPY WITH MEDIAL AND LATERAL  MENISECTOMY PARTIAL SYNOVECTOMY;  Surgeon: Kennedy Bucker, MD;  Location: [facility] ORS;  Service: Orthopedics;  Laterality: Right;   LEFT HEART CATH AND CORONARY ANGIOGRAPHY N/A 12/04/2019    Procedure: LEFT HEART CATH AND CORONARY ANGIOGRAPHY;  Surgeon: Tonny Bollman, MD;  Location: [facility] INVASIVE CV LAB;  Service: Cardiovascular;  Laterality: N/A;   LEFT HEART CATH AND CORONARY ANGIOGRAPHY Left 11/25/2022   Procedure: LEFT HEART CATH AND CORONARY ANGIOGRAPHY;  Surgeon: Laurier Nancy, MD;  Location: [facility] INVASIVE CV LAB;  Service: Cardiovascular;  Laterality: Left;   REVERSE SHOULDER ARTHROPLASTY Right 08/31/2023   Procedure: RIGHT REVERSE SHOULDER ARTHROPLASTY;  Surgeon: Cammy Copa, MD;  Location: [facility] OR;  Service: Orthopedics;  Laterality: Right;   SHOULDER SURGERY Right 04/09/2012   SPINE SURGERY     TOTAL KNEE ARTHROPLASTY Right 01/19/2021   Procedure: TOTAL KNEE ARTHROPLASTY - [place] Neighbor to Assist;  Surgeon: Kennedy Bucker, MD;  Location: [facility] ORS;  Service: Orthopedics;  Laterality: Right;     Current Outpatient Medications  Medication Sig Dispense Refill   acetaminophen (TYLENOL) 325 MG tablet Take 1 tablet (325 mg total) by mouth every 6 (six) hours as needed for mild pain (pain score 1-3) (or temp > 100.5). 60 tablet 0   albuterol (VENTOLIN HFA) 108 (90 Base) MCG/ACT inhaler Inhale 2 puffs into the lungs every 6 (six) hours as needed for wheezing or shortness of breath. 18 g 2   aspirin EC 81 MG tablet Take 1 tablet (81 mg total) by mouth daily. Swallow whole. 30 tablet 0   atorvastatin (LIPITOR) 80 MG tablet Take 1 tablet (80 mg total) by mouth daily. 30 tablet 0   Budeson-Glycopyrrol-Formoterol (BREZTRI AEROSPHERE) 160-9-4.8 MCG/ACT AERO Inhale 2 puffs into the lungs in the morning and at bedtime. 1 each 2   celecoxib (CELEBREX) 200 MG capsule Take 1 capsule (200 mg total) by mouth 2 (two) times daily. 40 capsule 0   divalproex (DEPAKOTE) 500 MG DR tablet Take 1 tablet in the morning and 2 tablets at bedtime 90 tablet 2   docusate sodium (COLACE) 100 MG capsule Take 1 capsule (100 mg total) by mouth 2 (two) times daily. 10 capsule 0   DULoxetine (CYMBALTA) 30 MG  capsule Take 3 capsules (90 mg total) by mouth daily. (Patient taking differently: Take 90 mg by mouth daily. Take 2 capsule in the AM and 1 capsule in the PM) 90 capsule 2   gabapentin (NEURONTIN) 600 MG tablet Take 1 tablet (600 mg total) by mouth daily AND 2 tablets (1,200 mg total) at bedtime. 90 tablet 2   hydrOXYzine (ATARAX) 25 MG tablet Take 1 tablet (25 mg total) by mouth 2 (two) times daily as needed. 60 tablet 0   nitroGLYCERIN (NITROSTAT) 0.4 MG SL tablet Place 1 tablet (0.4 mg total) under the tongue every 5 (five) minutes as needed for chest pain. 30 tablet 0   oxyCODONE (OXY IR/ROXICODONE) 5 MG immediate release tablet Take 1 tablet (5 mg total) by mouth every 6 (six) hours as needed for moderate pain (pain score 4-6) (pain score 4-6). 30 tablet 0   pantoprazole (PROTONIX) 40 MG tablet  Take 1 tablet (40 mg total) by mouth daily. 90 tablet 0   QUEtiapine (SEROQUEL) 200 MG tablet Take 1 tablet (200 mg total) by mouth 2 (two) times daily. 60 tablet 2   traZODone (DESYREL) 100 MG tablet Take 1 tablet (100 mg total) by mouth at bedtime. 30 tablet 2   No current facility-administered medications for this visit.    Allergies:   Asenapine, Dextromethorphan hbr, Guaifenesin, Latuda [lurasidone hcl], Lurasidone, and Phenylephrine    Social History:   reports that he has been smoking cigarettes. He started smoking about 33 years ago. He has a 16 pack-year smoking history. He has been exposed to tobacco smoke. He has never used smokeless tobacco. He reports that he does not currently use alcohol after a past usage of about 3.0 standard drinks of alcohol per week. He reports that he does not currently use drugs after having used the following drugs: Cocaine and Marijuana.   Family History:  family history includes Depression in his mother; Early death in his father; Heart attack in his father; Heart disease in his father and mother; Hypertension in his father, mother, and sister; Osteoarthritis in  his mother; Parkinson's disease in his maternal grandfather.    ROS:     Review of Systems  Constitutional: Negative.   HENT: Negative.    Eyes: Negative.   Respiratory: Negative.    Gastrointestinal: Negative.   Genitourinary: Negative.   Musculoskeletal: Negative.   Skin: Negative.   Neurological: Negative.   Endo/Heme/Allergies: Negative.   Psychiatric/Behavioral: Negative.    All other systems reviewed and are negative.     All other systems are reviewed and negative.    PHYSICAL EXAM: VS:  BP 137/76   Pulse (!) 55   Ht 6\' 3"  (1.905 m)   Wt 229 lb (103.9 kg)   SpO2 98%   BMI 28.62 kg/m  , BMI Body mass index is 28.62 kg/m. Last weight:  Wt Readings from Last 3 Encounters:  09/28/23 229 lb (103.9 kg)  09/20/23 228 lb 9.6 oz (103.7 kg)  08/31/23 232 lb (105.2 kg)     Physical Exam Vitals reviewed.  Constitutional:      Appearance: Normal appearance. He is normal weight.  HENT:     Head: Normocephalic.     Nose: Nose normal.     Mouth/Throat:     Mouth: Mucous membranes are moist.  Eyes:     Pupils: Pupils are equal, round, and reactive to light.  Cardiovascular:     Rate and Rhythm: Normal rate and regular rhythm.     Pulses: Normal pulses.     Heart sounds: Normal heart sounds.  Pulmonary:     Effort: Pulmonary effort is normal.  Abdominal:     General: Abdomen is flat. Bowel sounds are normal.  Musculoskeletal:        General: Normal range of motion.     Cervical back: Normal range of motion.  Skin:    General: Skin is warm.  Neurological:     General: No focal deficit present.     Mental Status: He is alert.  Psychiatric:        Mood and Affect: Mood normal.       EKG:   Recent Labs: 02/13/2023: TSH 1.970 05/30/2023: B Natriuretic Peptide 34.3; Magnesium 1.6 06/28/2023: ALT 9 08/24/2023: BUN 10; Creatinine, Ser 1.00; Hemoglobin 15.6; Platelets 165; Potassium 4.1; Sodium 136    Lipid Panel    Component Value Date/Time   CHOL 83  04/26/2022 1530   CHOL 92 (L) 08/31/2021 1408   TRIG 52 04/26/2022 1530   HDL 28 (L) 04/26/2022 1530   HDL 33 (L) 08/31/2021 1408   CHOLHDL 3.0 04/26/2022 1530   VLDL 10 04/26/2022 1530   LDLCALC 45 04/26/2022 1530   LDLCALC 41 08/31/2021 1408      Other studies Reviewed: Additional studies/ records that were reviewed today include:  Review of the above records demonstrates:       No data to display            ASSESSMENT AND PLAN:    ICD-10-CM   1. Essential hypertension  I10     2. CAD S/P percutaneous coronary angioplasty  I25.10    Z98.61    no chest pain    3. Other cirrhosis of liver (HCC)  K74.69     4. Gastroesophageal reflux disease, unspecified whether esophagitis present  K21.9     5. Alcohol use disorder, moderate, dependence (HCC)  F10.20        Problem List Items Addressed This Visit       Cardiovascular and Mediastinum   CAD S/P percutaneous coronary angioplasty (Chronic)   Essential hypertension - Primary     Digestive   GERD (gastroesophageal reflux disease)   Cirrhosis of liver (HCC)     Other   Alcohol use disorder, moderate, dependence (HCC)       Disposition:   Return in about 3 months (around 12/29/2023).    Total time spent: 35 minutes  Signed,  Adrian Blackwater, MD  09/28/2023 10:19 AM    [facility]

## 2023-10-02 ENCOUNTER — Telehealth: Payer: Self-pay | Admitting: Orthopedic Surgery

## 2023-10-02 ENCOUNTER — Other Ambulatory Visit: Payer: Self-pay | Admitting: Surgical

## 2023-10-02 MED ORDER — OXYCODONE HCL 5 MG PO TABS: 5.0000 mg | ORAL_TABLET | Freq: Three times a day (TID) | ORAL | 0 refills | Status: DC | PRN

## 2023-10-02 NOTE — Telephone Encounter (Signed)
 Sent in

## 2023-10-02 NOTE — Telephone Encounter (Signed)
 Rx refill Oxycodone   [facility] @ [address]

## 2023-10-02 NOTE — Telephone Encounter (Signed)
 I called patient and advised.

## 2023-10-03 ENCOUNTER — Telehealth: Payer: Self-pay | Admitting: Orthopedic Surgery

## 2023-10-03 NOTE — Therapy (Deleted)
 OUTPATIENT PHYSICAL THERAPY SHOULDER EVALUATION   Patient Name: Keith Gibbs MRN: [id number] DOB:02-Apr-1961, 63 y.o., male Today's Date: 10/03/2023  END OF SESSION:    Past Medical History:  Diagnosis Date   Anginal pain (HCC)    Anxiety    Arthritis    Bipolar 1 disorder (HCC)    Bursitis    CAD (coronary artery disease)    Chronic pain    COPD (chronic obstructive pulmonary disease) (HCC)    Current use of long term anticoagulation    DAPT (ASA + clopidogrel)   Depression    Diverticulitis    Dyspnea    GERD (gastroesophageal reflux disease)    Grade I diastolic dysfunction    Hepatitis C 2012   No longer has Hep C   HLD (hyperlipidemia)    Hypertension    MI (myocardial infarction) (HCC)    Polysubstance abuse (HCC)    cocaine, marijuana, ETOH   PUD (peptic ulcer disease)    S/P angioplasty with stent 06/10/2016   a.) 90% stenosis of pLAD to mLAD - 2.5 x 18 mm Xience Alpine (DES x 1) placed to pLAD   S/P PTCA (percutaneous transluminal coronary angioplasty) 12/04/2019   a.) 60% in stent restenosis of DES to pLAD; LVEF 65%.   Schizophrenia (HCC)    Stroke [facility])    Valvular insufficiency    a.) Mild MR, TR, PR; mild to moderate AR on 03/05/2018 TTE   Past Surgical History:  Procedure Laterality Date   ABDOMINAL SURGERY     removed small piece of intestines due to [facility] Diverticulosis   ANTERIOR LAT LUMBAR FUSION N/A 08/23/2022   Procedure: DIRECT LATERAL INTERBODY FUSION  LUMBAR TWO- LUMBAR THREE, LUMBAR THREE-LUMBAR FOUR , EXPLORE AND EXTEND FUSION LUMBAR TWO-LUMBAR FIVE, POSTERIOR DECOMPRESSION LUMBAR THREE-LUMBAR FOUR, LEFT LUMBAR TWO-LUMBAR THREE;  Surgeon: Bedelia Person, MD;  Location: [facility] OR;  Service: Neurosurgery;  Laterality: N/A;   APPENDECTOMY     BACK SURGERY     CARDIAC CATHETERIZATION Left 06/10/2016   Procedure: Left Heart Cath and Coronary Angiography;  Surgeon: Laurier Nancy, MD;  Location: [facility] INVASIVE CV LAB;  Service:  Cardiovascular;  Laterality: Left;   CARDIAC CATHETERIZATION N/A 06/10/2016   Procedure: Coronary Stent Intervention;  Surgeon: Alwyn Pea, MD;  Location: [facility] INVASIVE CV LAB;  Service: Cardiovascular;  Laterality: N/A;   CHOLECYSTECTOMY N/A 02/17/2022   Procedure: LAPAROSCOPIC CHOLECYSTECTOMY;  Surgeon: Sheliah Hatch De Blanch, MD;  Location: [facility] OR;  Service: General;  Laterality: N/A;   COLON SURGERY     COLONOSCOPY     COLONOSCOPY WITH PROPOFOL N/A 01/05/2017   Procedure: COLONOSCOPY WITH PROPOFOL;  Surgeon: Wyline Mood, MD;  Location: [facility] ENDOSCOPY;  Service: Endoscopy;  Laterality: N/A;   COLONOSCOPY WITH PROPOFOL N/A 02/13/2020   Procedure: COLONOSCOPY WITH PROPOFOL;  Surgeon: Pasty Spillers, MD;  Location: [facility] ENDOSCOPY;  Service: Endoscopy;  Laterality: N/A;   CORONARY ANGIOPLASTY WITH STENT PLACEMENT     CORONARY PRESSURE/FFR STUDY N/A 12/04/2019   Procedure: INTRAVASCULAR PRESSURE WIRE/FFR STUDY;  Surgeon: Tonny Bollman, MD;  Location: [facility] INVASIVE CV LAB;  Service: Cardiovascular;  Laterality: N/A;   ESOPHAGOGASTRODUODENOSCOPY (EGD) WITH PROPOFOL N/A 01/05/2017   Procedure: ESOPHAGOGASTRODUODENOSCOPY (EGD) WITH PROPOFOL;  Surgeon: Wyline Mood, MD;  Location: [facility] ENDOSCOPY;  Service: Endoscopy;  Laterality: N/A;   ESOPHAGOGASTRODUODENOSCOPY (EGD) WITH PROPOFOL N/A 02/13/2020   Procedure: ESOPHAGOGASTRODUODENOSCOPY (EGD) WITH PROPOFOL;  Surgeon: Pasty Spillers, MD;  Location: [facility] ENDOSCOPY;  Service: Endoscopy;  Laterality: N/A;  KNEE ARTHROSCOPY WITH MEDIAL MENISECTOMY Right 09/05/2017   Procedure: KNEE ARTHROSCOPY WITH MEDIAL AND LATERAL  MENISECTOMY PARTIAL SYNOVECTOMY;  Surgeon: Kennedy Bucker, MD;  Location: [facility] ORS;  Service: Orthopedics;  Laterality: Right;   LEFT HEART CATH AND CORONARY ANGIOGRAPHY N/A 12/04/2019   Procedure: LEFT HEART CATH AND CORONARY ANGIOGRAPHY;  Surgeon: Tonny Bollman, MD;  Location: [facility] INVASIVE CV LAB;  Service: Cardiovascular;   Laterality: N/A;   LEFT HEART CATH AND CORONARY ANGIOGRAPHY Left 11/25/2022   Procedure: LEFT HEART CATH AND CORONARY ANGIOGRAPHY;  Surgeon: Laurier Nancy, MD;  Location: [facility] INVASIVE CV LAB;  Service: Cardiovascular;  Laterality: Left;   REVERSE SHOULDER ARTHROPLASTY Right 08/31/2023   Procedure: RIGHT REVERSE SHOULDER ARTHROPLASTY;  Surgeon: Cammy Copa, MD;  Location: [facility] OR;  Service: Orthopedics;  Laterality: Right;   SHOULDER SURGERY Right 04/09/2012   SPINE SURGERY     TOTAL KNEE ARTHROPLASTY Right 01/19/2021   Procedure: TOTAL KNEE ARTHROPLASTY - [place] Neighbor to Assist;  Surgeon: Kennedy Bucker, MD;  Location: [facility] ORS;  Service: Orthopedics;  Laterality: Right;   Patient Active Problem List   Diagnosis Date Noted   Shoulder arthritis 08/31/2023   Lumbar radiculopathy 05/30/2023   Major depressive disorder, recurrent severe without psychotic features (HCC) 01/28/2023   S/P lumbar fusion 08/23/2022   Postlaminectomy syndrome, not elsewhere classified 08/12/2022   MDD (major depressive disorder) 08/10/2022   Substance induced mood disorder (HCC) 08/06/2022   Suicide attempt by drug overdose (HCC) 08/06/2022   Toxic encephalopathy 08/03/2022   Suicide ideation 08/03/2022   Encephalopathy 08/03/2022   Polysubstance abuse (HCC) 04/28/2022   Postoperative abdominal pain 02/28/2022   Acute cholecystitis 02/17/2022   Obstruction of nasal valve 01/07/2022   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   Pulmonary nodule 12/17/2021   CAD S/P percutaneous coronary angioplasty 12/17/2021   Epigastric abdominal pain    Nausea and vomiting    Tobacco dependence due to cigarettes 08/31/2021   Iron deficiency anemia secondary to inadequate dietary iron intake 08/31/2021   Acute lead-induced gout involving toe of left foot 08/31/2021   Encounter for lipid screening for cardiovascular disease 08/31/2021   Hypotension due to drugs 06/21/2021   Opioid withdrawal (HCC) 06/21/2021   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HCC)  06/21/2021   RLS (restless legs syndrome) 05/07/2021   Acquired deviated nasal septum 05/03/2021   Encounter for surveillance of abnormal nevi 05/03/2021   Flu vaccine need 05/03/2021   Substance abuse (HCC) 05/03/2021   Drug abuse and dependence (HCC) 05/03/2021   Need for shingles vaccine 05/03/2021   Right groin pain 01/29/2021   S/P TKR (total knee replacement) using cement, right 01/19/2021   Cigarette smoker 07/21/2020   Foot pain, bilateral 12/25/2019   Potential exposure to STD 12/12/2019   History of lumbar surgery 12/12/2019   Fatigue 12/12/2019   Penile lesion 12/12/2019   Right testicular pain 12/12/2019   Body mass index 28.0-28.9, adult 12/12/2019   History of COPD 12/12/2019   Tobacco use disorder, continuous 12/12/2019   Hepatoma (HCC) 11/05/2019   Anxiety 12/10/2018   Essential hypertension 09/18/2018   Hypercholesteremia 09/18/2018   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   Chronic anticoagulation (Plavix) 04/19/2018   Pain medication agreement broken ([facility]) 04/19/2018   Cocaine use 04/18/2018   Cocaine abuse (HCC) (See 04/12/18 UDS) 04/18/2018   Marijuana abuse (See 04/12/18 UDS) 04/18/2018   Long term current use of opiate analgesic 04/12/2018   Cirrhosis of liver (HCC) 04/03/2018   BPH (benign prostatic hyperplasia) 04/03/2018   Hematuria, microscopic  04/03/2018   Osteoarthritis of the knee (Left) 11/02/2017   Lumbar facet syndrome (Bilateral) 11/01/2017   Spondylosis without myelopathy or radiculopathy, lumbosacral region 11/01/2017   Osteoarthritis of shoulder (Right) 11/01/2017   Osteoarthritis of acromioclavicular joint (Right) 11/01/2017   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) 11/01/2017   Osteoarthritis 11/01/2017   Cervicalgia 11/01/2017   Cervical facet syndrome 11/01/2017   DDD (degenerative disc disease), cervical 11/01/2017   DDD (degenerative disc disease), lumbar 11/01/2017   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)  09/18/2017   Other chronic pain 09/18/2017   Chronic musculoskeletal pain 09/18/2017   Chronic pain of right knee 09/11/2017   Chronic low back pain (Secondary Area of Pain) (Bilateral) 09/11/2017   Chronic hip pain (Tertiary Area of Pain) (Bilateral) (R>L) 09/11/2017   Chronic shoulder pain (Fourth Area of Pain) (Right) 09/11/2017   Spondylosis without myelopathy or radiculopathy, cervical region 09/11/2017   Chronic pain syndrome 09/11/2017   Opiate use 09/11/2017   Screen for STD (sexually transmitted disease) 09/11/2017   Disorder of skeletal system 09/11/2017   Problems influencing health status 09/11/2017   Overdose of medication, intentional self-harm, sequela (HCC) 12/28/2016   Severe recurrent major depression without psychotic features (HCC) 12/27/2016   Alcohol use disorder, moderate, dependence (HCC) 12/27/2016   Cannabis use disorder, moderate, dependence (HCC) 12/27/2016   Chest pain 06/10/2016   Bipolar 1 disorder (HCC) 05/27/2013   Myalgia 05/27/2013   Abnormal ejaculation 11/30/2012   Chest pain, unspecified 11/30/2012   Degenerative arthritis of hip 11/16/2012   Schizoaffective disorder (HCC) 06/27/2012   COPD (chronic obstructive pulmonary disease) (HCC) 06/27/2012   Chronic hepatitis C (HCC) 06/27/2012   GERD (gastroesophageal reflux disease) 06/27/2012   Complete rupture of rotator cuff 02/10/2012   Cocaine abuse in remission (HCC) 12/05/2011   Unspecified osteoarthritis, unspecified site 07/27/2011    PCP: Rema Fendt NP   REFERRING PROVIDER: Julieanne Cotton, PA-C  REFERRING DIAG: M25.511 (ICD-10-CM) - Acute pain of right shoulder Z96.611 (ICD-10-CM) - History of arthroplasty of right shoulder  THERAPY DIAG:  No diagnosis found.  Rationale for Evaluation and Treatment: Rehabilitation  ONSET DATE: 08/31/2023   SUBJECTIVE:                                                                                                                                                                                       SUBJECTIVE STATEMENT: Pt underwent Rt RTSR on 08/31/23.  Patient recently saw the doctor who was pleased with his progress so far.  Pt reports frustration with his physical limitations. He is not working right now, disabled.  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  Patient reports his pain limits his ability to sleep, work on his motorcycle and other home tasks.  He recently discontinued the use of the sling but does still use pillows to help get comfortable.  He does report couple of days ago he tripped while walking up the stairs and had to catch himself on both hands.  Hand dominance: Right  PERTINENT HISTORY: Patient has had multiple surgeries including lumbar fusion, knee arthroscopy, osteoarthritis COPD and cardiac stent. Substance abuse.   PAIN:  Are you having pain? Yes: NPRS scale: 8/10 Pain location: Rt ant shoulder  Pain description: sore and achy Aggravating factors: constant , worse with using it  Relieving factors: rest, meds, ice   PRECAUTIONS: Other: protocol: no ER past 30 deg  RED FLAGS: None   WEIGHT BEARING RESTRICTIONS: No  FALLS:  Has patient fallen in last 6 months? Yes, 1 time yesterday, walking up the stairs  LIVING ENVIRONMENT: Lives with: lives with their familymoved back in with his mom after being widowed  Lives in: House/apartment Stairs: Yes: External: no issues  steps; NA  Has following equipment at home: None  OCCUPATION: Disabled  PLOF: Independent with basic ADLs, Vocation/Vocational requirements: NA, and Leisure: likes to use his hands , takes his motorcycle out and paint   PATIENT GOALS:I would like to ride my motorcycle again.   NEXT MD VISIT:   OBJECTIVE:  Note: Objective measures were completed at Evaluation unless otherwise noted.  DIAGNOSTIC FINDINGS:  None   PATIENT SURVEYS:  Quick Dash 40/55  COGNITION: Overall cognitive status: Within functional limits for tasks  assessed     SENSATION: WFL  POSTURE: Rounded shoulders, elbow flexed   UPPER EXTREMITY ROM:   Active ROM Right eval Left eval  Shoulder flexion 110 160  Shoulder extension    Shoulder abduction 90 Full   Shoulder adduction    Shoulder internal rotation PROM 50  Full   Shoulder external rotation PROM 40  Full   Elbow flexion Full    Elbow extension -30   Wrist flexion    Wrist extension    Wrist ulnar deviation    Wrist radial deviation    Wrist pronation    Wrist supination    (Blank rows = not tested)  UPPER EXTREMITY MMT:Nt on Rt GH joint, appears 3-/5   MMT Right eval Left Eval WNL   Shoulder flexion    Shoulder extension    Shoulder abduction    Shoulder adduction    Shoulder internal rotation    Shoulder external rotation    Middle trapezius    Lower trapezius    Elbow flexion 4   Elbow extension 4-   Wrist flexion    Wrist extension    Wrist ulnar deviation    Wrist radial deviation    Wrist pronation    Wrist supination    Grip strength (lbs)    (Blank rows = not tested)  PALPATION:  Steristrips in place, pain along anterior aspect of shoulder , some laterally and along clavicle  TREATMENT DATE:   [facility] Adult PT Treatment:                                                DATE: 10/04/23 Therapeutic Exercise: *** Manual Therapy: *** Neuromuscular re-ed: *** Therapeutic Activity: *** Modalities: *** Self Care: [facility] PT Treatment:                                                DATE: 09/21/23  Self Care: HEP, protocol, precautions  - no charge    PATIENT EDUCATION: Education details: Precautions, HEP  and AAROM, POC  Person educated: Patient Education method: Explanation, Demonstration, Verbal cues, and Handouts Education comprehension: verbalized understanding, returned demonstration, and needs further  education  HOME EXERCISE PROGRAM: Access Code: [email or web address] URL: [email or web address] Date: 09/21/2023 Prepared by: Karie Mainland  Exercises - Isometric Shoulder Abduction at Wall  - 1-3 x daily - 7 x weekly - 2 sets - 10 reps - 5 hold - Standing Scapular Retraction  - 1-3 x daily - 7 x weekly - 2 sets - 10 reps - 5 hold - Supine Shoulder Flexion Extension AAROM with Dowel  - 1-3 x daily - 7 x weekly - 2 sets - 10 reps - 5 hold  ASSESSMENT:  CLINICAL IMPRESSION: Patient is a 63 y.o. male who was seen today for physical therapy evaluation and treatment for Reverse TSR on 08/31/23.  Overall patient is doing quite well despite reported moderate to severe pain on a constant basis.  Patient will benefit from skilled physical therapy to work on progressive improvements in range of motion strength and overall mobility.  OBJECTIVE IMPAIRMENTS: decreased ROM, decreased strength, hypomobility, increased fascial restrictions, impaired UE functional use, postural dysfunction, and pain.   ACTIVITY LIMITATIONS: carrying, lifting, sleeping, reach over head, hygiene/grooming, and locomotion level  PARTICIPATION LIMITATIONS: cleaning, driving, community activity, and recreation   PERSONAL FACTORS: 3+ comorbidities: Rt elbow stiffness, lumbar surgeries, chronic pain   are also affecting patient's functional outcome.   REHAB POTENTIAL: Excellent  CLINICAL DECISION MAKING: Stable/uncomplicated  EVALUATION COMPLEXITY: Low   GOALS: Goals reviewed with patient? Yes  SHORT TERM GOALS: Target date: 10/19/2023    Patient will be independent with initial home exercise program for active range of motion and strength Baseline: Given on evaluation Goal status: INITIAL  2.  Patient will be able to complete ADLs without increased pain including bathing dressing and hygiene Baseline: Pain is moderate to severe Goal status: INITIAL  3.  Patient will require min to no cueing to reduce compensation  and shoulder height with shoulder flexion and abduction Baseline:  Goal status: INITIAL  LONG TERM GOALS: Target date: 11/16/2023    Patient will be independent with home exercise program upon discharge Baseline:  Goal status: INITIAL  2.  Patient will demonstrate 4/5 shoulder strength in order to resume recreational activities Baseline:  Goal status: INITIAL  3.  Patient will demonstrate full active range of motion in all planes with pain less than a 4 out of 10 Baseline:  Goal status: INITIAL  4.  Patient will be able to return to painting and mechanical work on his motorcycle without increasing pain Baseline:  Goal status: INITIAL  5.  QuickDASH score will improve by 10 point as a proxy for improved shoulder function Baseline: 39/55 Goal status: INITIAL   PLAN:  PT FREQUENCY: 2x/week  PT DURATION: 8 weeks  PLANNED INTERVENTIONS: 97164- PT Re-evaluation, 97110-Therapeutic exercises, 97530- Therapeutic activity, 97112- Neuromuscular re-education, 97535- Self Care, 16109- Manual therapy, 97016- Vasopneumatic device, Patient/Family education, Joint mobilization, Cryotherapy, and Moist heat  PLAN FOR NEXT SESSION: Patient is allowed to begin deltoid isometrics follow protocol increasing shoulder passive range of motion and progressing into active range of motion modalities as needed for pain, manual    Mykah Bellomo, PT 10/03/2023, 9:20 AM   Karie Mainland, PT 10/03/23 9:20 AM Phone: [phone] Fax: [phone]   For all possible CPT codes, reference the Planned Interventions line above.     Check all conditions that are expected to impact treatment: {Conditions expected to impact treatment:Contractures, spasticity or fracture relevant to requested treatment and Social determinants of health   If treatment provided at initial evaluation, no treatment charged due to lack of authorization.     Karie Mainland, PT 10/03/23 9:20 AM Phone: [phone] Fax: [phone]

## 2023-10-03 NOTE — Telephone Encounter (Signed)
 Pt called requesting for a pre auth to be sent for oxycodone. Please send pre auth to [facility] [address]. Pt phone number is [phone].

## 2023-10-03 NOTE — Telephone Encounter (Signed)
 IC [phone] to initiate PA. Was advised PA had to be submitted via fax. Form completed and faxed to [facility] with last OV note for review. Was advised by representative would take up to 24 hrs for review to be completed.

## 2023-10-04 ENCOUNTER — Ambulatory Visit: Payer: MEDICAID | Admitting: Physical Therapy

## 2023-10-06 ENCOUNTER — Ambulatory Visit: Payer: MEDICAID | Attending: Surgical

## 2023-10-06 DIAGNOSIS — M25611 Stiffness of right shoulder, not elsewhere classified: Secondary | ICD-10-CM | POA: Insufficient documentation

## 2023-10-06 DIAGNOSIS — Z96611 Presence of right artificial shoulder joint: Secondary | ICD-10-CM | POA: Insufficient documentation

## 2023-10-06 DIAGNOSIS — M6281 Muscle weakness (generalized): Secondary | ICD-10-CM | POA: Insufficient documentation

## 2023-10-06 NOTE — Therapy (Signed)
 OUTPATIENT PHYSICAL THERAPY SHOULDER TREATMENT   Patient Name: Keith Gibbs MRN: [id number] DOB:[birth year], 63 y.o., male Today's Date: 10/06/2023  END OF SESSION:  PT End of Session - 10/06/23 1549     Visit Number 2    Number of Visits 17    Date for PT Re-Evaluation 11/16/23    Authorization Type [employer]    PT Start Time 1100    PT Stop Time 1143    PT Time Calculation (min) 43 min    Activity Tolerance Patient tolerated treatment well    Behavior During Therapy WFL for tasks assessed/performed              Past Medical History:  Diagnosis Date   Anginal pain (HCC)    Anxiety    Arthritis    Bipolar 1 disorder (HCC)    Bursitis    CAD (coronary artery disease)    Chronic pain    COPD (chronic obstructive pulmonary disease) (HCC)    Current use of long term anticoagulation    DAPT (ASA + clopidogrel)   Depression    Diverticulitis    Dyspnea    GERD (gastroesophageal reflux disease)    Grade I diastolic dysfunction    Hepatitis C 2012   No longer has Hep C   HLD (hyperlipidemia)    Hypertension    MI (myocardial infarction) (HCC)    Polysubstance abuse (HCC)    cocaine, marijuana, ETOH   PUD (peptic ulcer disease)    S/P angioplasty with stent 06/10/2016   a.) 90% stenosis of pLAD to mLAD - 2.5 x 18 mm Xience Alpine (DES x 1) placed to pLAD   S/P PTCA (percutaneous transluminal coronary angioplasty) 12/04/2019   a.) 60% in stent restenosis of DES to pLAD; LVEF 65%.   Schizophrenia (HCC)    Stroke [facility])    Valvular insufficiency    a.) Mild MR, TR, PR; mild to moderate AR on 03/05/2018 TTE   Past Surgical History:  Procedure Laterality Date   ABDOMINAL SURGERY     removed small piece of intestines due to [facility] Diverticulosis   ANTERIOR LAT LUMBAR FUSION N/A 08/23/2022   Procedure: DIRECT LATERAL INTERBODY FUSION  LUMBAR TWO- LUMBAR THREE, LUMBAR THREE-LUMBAR FOUR , EXPLORE AND EXTEND FUSION LUMBAR TWO-LUMBAR FIVE, POSTERIOR  DECOMPRESSION LUMBAR THREE-LUMBAR FOUR, LEFT LUMBAR TWO-LUMBAR THREE;  Surgeon: Bedelia Person, MD;  Location: [facility] OR;  Service: Neurosurgery;  Laterality: N/A;   APPENDECTOMY     BACK SURGERY     CARDIAC CATHETERIZATION Left 06/10/2016   Procedure: Left Heart Cath and Coronary Angiography;  Surgeon: Laurier Nancy, MD;  Location: [facility] INVASIVE CV LAB;  Service: Cardiovascular;  Laterality: Left;   CARDIAC CATHETERIZATION N/A 06/10/2016   Procedure: Coronary Stent Intervention;  Surgeon: Alwyn Pea, MD;  Location: [facility] INVASIVE CV LAB;  Service: Cardiovascular;  Laterality: N/A;   CHOLECYSTECTOMY N/A 02/17/2022   Procedure: LAPAROSCOPIC CHOLECYSTECTOMY;  Surgeon: Sheliah Hatch De Blanch, MD;  Location: [facility] OR;  Service: General;  Laterality: N/A;   COLON SURGERY     COLONOSCOPY     COLONOSCOPY WITH PROPOFOL N/A 01/05/2017   Procedure: COLONOSCOPY WITH PROPOFOL;  Surgeon: Wyline Mood, MD;  Location: [facility] ENDOSCOPY;  Service: Endoscopy;  Laterality: N/A;   COLONOSCOPY WITH PROPOFOL N/A 02/13/2020   Procedure: COLONOSCOPY WITH PROPOFOL;  Surgeon: Pasty Spillers, MD;  Location: [facility] ENDOSCOPY;  Service: Endoscopy;  Laterality: N/A;   CORONARY ANGIOPLASTY WITH STENT PLACEMENT     CORONARY PRESSURE/FFR  STUDY N/A 12/04/2019   Procedure: INTRAVASCULAR PRESSURE WIRE/FFR STUDY;  Surgeon: Tonny Bollman, MD;  Location: [facility] INVASIVE CV LAB;  Service: Cardiovascular;  Laterality: N/A;   ESOPHAGOGASTRODUODENOSCOPY (EGD) WITH PROPOFOL N/A 01/05/2017   Procedure: ESOPHAGOGASTRODUODENOSCOPY (EGD) WITH PROPOFOL;  Surgeon: Wyline Mood, MD;  Location: [facility] ENDOSCOPY;  Service: Endoscopy;  Laterality: N/A;   ESOPHAGOGASTRODUODENOSCOPY (EGD) WITH PROPOFOL N/A 02/13/2020   Procedure: ESOPHAGOGASTRODUODENOSCOPY (EGD) WITH PROPOFOL;  Surgeon: Pasty Spillers, MD;  Location: [facility] ENDOSCOPY;  Service: Endoscopy;  Laterality: N/A;   KNEE ARTHROSCOPY WITH MEDIAL MENISECTOMY Right 09/05/2017   Procedure:  KNEE ARTHROSCOPY WITH MEDIAL AND LATERAL  MENISECTOMY PARTIAL SYNOVECTOMY;  Surgeon: Kennedy Bucker, MD;  Location: [facility] ORS;  Service: Orthopedics;  Laterality: Right;   LEFT HEART CATH AND CORONARY ANGIOGRAPHY N/A 12/04/2019   Procedure: LEFT HEART CATH AND CORONARY ANGIOGRAPHY;  Surgeon: Tonny Bollman, MD;  Location: [facility] INVASIVE CV LAB;  Service: Cardiovascular;  Laterality: N/A;   LEFT HEART CATH AND CORONARY ANGIOGRAPHY Left 11/25/2022   Procedure: LEFT HEART CATH AND CORONARY ANGIOGRAPHY;  Surgeon: Laurier Nancy, MD;  Location: [facility] INVASIVE CV LAB;  Service: Cardiovascular;  Laterality: Left;   REVERSE SHOULDER ARTHROPLASTY Right 08/31/2023   Procedure: RIGHT REVERSE SHOULDER ARTHROPLASTY;  Surgeon: Cammy Copa, MD;  Location: [facility] OR;  Service: Orthopedics;  Laterality: Right;   SHOULDER SURGERY Right 04/09/2012   SPINE SURGERY     TOTAL KNEE ARTHROPLASTY Right 01/19/2021   Procedure: TOTAL KNEE ARTHROPLASTY - [place] Neighbor to Assist;  Surgeon: Kennedy Bucker, MD;  Location: [facility] ORS;  Service: Orthopedics;  Laterality: Right;   Patient Active Problem List   Diagnosis Date Noted   Shoulder arthritis 08/31/2023   Lumbar radiculopathy 05/30/2023   Major depressive disorder, recurrent severe without psychotic features (HCC) 01/28/2023   S/P lumbar fusion 08/23/2022   Postlaminectomy syndrome, not elsewhere classified 08/12/2022   MDD (major depressive disorder) 08/10/2022   Substance induced mood disorder (HCC) 08/06/2022   Suicide attempt by drug overdose (HCC) 08/06/2022   Toxic encephalopathy 08/03/2022   Suicide ideation 08/03/2022   Encephalopathy 08/03/2022   Polysubstance abuse (HCC) 04/28/2022   Postoperative abdominal pain 02/28/2022   Acute cholecystitis 02/17/2022   Obstruction of nasal valve 01/07/2022   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   Pulmonary nodule 12/17/2021   CAD S/P percutaneous coronary angioplasty 12/17/2021   Epigastric abdominal pain    Nausea and vomiting     Tobacco dependence due to cigarettes 08/31/2021   Iron deficiency anemia secondary to inadequate dietary iron intake 08/31/2021   Acute lead-induced gout involving toe of left foot 08/31/2021   Encounter for lipid screening for cardiovascular disease 08/31/2021   Hypotension due to drugs 06/21/2021   Opioid withdrawal (HCC) 06/21/2021   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HCC) 06/21/2021   RLS (restless legs syndrome) 05/07/2021   Acquired deviated nasal septum 05/03/2021   Encounter for surveillance of abnormal nevi 05/03/2021   Flu vaccine need 05/03/2021   Substance abuse (HCC) 05/03/2021   Drug abuse and dependence (HCC) 05/03/2021   Need for shingles vaccine 05/03/2021   Right groin pain 01/29/2021   S/P TKR (total knee replacement) using cement, right 01/19/2021   Cigarette smoker 07/21/2020   Foot pain, bilateral 12/25/2019   Potential exposure to STD 12/12/2019   History of lumbar surgery 12/12/2019   Fatigue 12/12/2019   Penile lesion 12/12/2019   Right testicular pain 12/12/2019   Body mass index 28.0-28.9, adult 12/12/2019   History of COPD 12/12/2019   Tobacco use disorder, continuous  12/12/2019   Hepatoma (HCC) 11/05/2019   Anxiety 12/10/2018   Essential hypertension 09/18/2018   Hypercholesteremia 09/18/2018   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   Chronic anticoagulation (Plavix) 04/19/2018   Pain medication agreement broken ([facility]) 04/19/2018   Cocaine use 04/18/2018   Cocaine abuse (HCC) (See 04/12/18 UDS) 04/18/2018   Marijuana abuse (See 04/12/18 UDS) 04/18/2018   Long term current use of opiate analgesic 04/12/2018   Cirrhosis of liver (HCC) 04/03/2018   BPH (benign prostatic hyperplasia) 04/03/2018   Hematuria, microscopic 04/03/2018   Osteoarthritis of the knee (Left) 11/02/2017   Lumbar facet syndrome (Bilateral) 11/01/2017   Spondylosis without myelopathy or radiculopathy, lumbosacral region 11/01/2017   Osteoarthritis of shoulder (Right) 11/01/2017    Osteoarthritis of acromioclavicular joint (Right) 11/01/2017   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) 11/01/2017   Osteoarthritis 11/01/2017   Cervicalgia 11/01/2017   Cervical facet syndrome 11/01/2017   DDD (degenerative disc disease), cervical 11/01/2017   DDD (degenerative disc disease), lumbar 11/01/2017   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) 09/18/2017   Other chronic pain 09/18/2017   Chronic musculoskeletal pain 09/18/2017   Chronic pain of right knee 09/11/2017   Chronic low back pain (Secondary Area of Pain) (Bilateral) 09/11/2017   Chronic hip pain (Tertiary Area of Pain) (Bilateral) (R>L) 09/11/2017   Chronic shoulder pain (Fourth Area of Pain) (Right) 09/11/2017   Spondylosis without myelopathy or radiculopathy, cervical region 09/11/2017   Chronic pain syndrome 09/11/2017   Opiate use 09/11/2017   Screen for STD (sexually transmitted disease) 09/11/2017   Disorder of skeletal system 09/11/2017   Problems influencing health status 09/11/2017   Overdose of medication, intentional self-harm, sequela (HCC) 12/28/2016   Severe recurrent major depression without psychotic features (HCC) 12/27/2016   Alcohol use disorder, moderate, dependence (HCC) 12/27/2016   Cannabis use disorder, moderate, dependence (HCC) 12/27/2016   Chest pain 06/10/2016   Bipolar 1 disorder (HCC) 05/27/2013   Myalgia 05/27/2013   Abnormal ejaculation 11/30/2012   Chest pain, unspecified 11/30/2012   Degenerative arthritis of hip 11/16/2012   Schizoaffective disorder (HCC) 06/27/2012   COPD (chronic obstructive pulmonary disease) (HCC) 06/27/2012   Chronic hepatitis C (HCC) 06/27/2012   GERD (gastroesophageal reflux disease) 06/27/2012   Complete rupture of rotator cuff 02/10/2012   Cocaine abuse in remission (HCC) 12/05/2011   Unspecified osteoarthritis, unspecified site 07/27/2011    PCP: Rema Fendt NP   REFERRING PROVIDER: Julieanne Cotton, PA-C  REFERRING DIAG:  M25.511 (ICD-10-CM) - Acute pain of right shoulder Z96.611 (ICD-10-CM) - History of arthroplasty of right shoulder  THERAPY DIAG:  S/P reverse total shoulder arthroplasty, right  Muscle weakness (generalized)  Stiffness of right shoulder, not elsewhere classified  Rationale for Evaluation and Treatment: Rehabilitation  ONSET DATE: 08/31/2023   SUBJECTIVE:  SUBJECTIVE STATEMENT: Pt reports his R shoulder is doing well. He is completing his HEP daily.  EVAL: Pt underwent Rt RTSR on 08/31/23.  Patient recently saw the doctor who was pleased with his progress so far.  Pt reports frustration with his physical limitations. He is not working right now, disabled.  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  Patient reports his pain limits his ability to sleep, work on his motorcycle and other home tasks.  He recently discontinued the use of the sling but does still use pillows to help get comfortable.  He does report couple of days ago he tripped while walking up the stairs and had to catch himself on both hands.  Hand dominance: Right  PERTINENT HISTORY: Patient has had multiple surgeries including lumbar fusion, knee arthroscopy, osteoarthritis COPD and cardiac stent. Substance abuse.   PAIN:  Are you having pain? Yes: NPRS scale: 5/10 Pain location: Rt ant shoulder  Pain description: sore and achy Aggravating factors: constant , worse with using it  Relieving factors: rest, meds, ice   PRECAUTIONS: Other: protocol: no ER past 30 deg  RED FLAGS: None   WEIGHT BEARING RESTRICTIONS: No  FALLS:  Has patient fallen in last 6 months? Yes, 1 time yesterday, walking up the stairs  LIVING ENVIRONMENT: Lives with: lives with their familymoved back in with his mom after being widowed  Lives in: House/apartment Stairs: Yes:  External: no issues  steps; NA  Has following equipment at home: None  OCCUPATION: Disabled  PLOF: Independent with basic ADLs, Vocation/Vocational requirements: NA, and Leisure: likes to use his hands , takes his motorcycle out and paint   PATIENT GOALS:I would like to ride my motorcycle again.   NEXT MD VISIT:   OBJECTIVE:  Note: Objective measures were completed at Evaluation unless otherwise noted.  DIAGNOSTIC FINDINGS:  None   PATIENT SURVEYS:  Quick Dash 40/55  COGNITION: Overall cognitive status: Within functional limits for tasks assessed     SENSATION: WFL  POSTURE: Rounded shoulders, elbow flexed   UPPER EXTREMITY ROM:   Active ROM Right eval Left eval Rt 10/06/23  Shoulder flexion 110 160 AA 130  Shoulder extension     Shoulder abduction 90 Full    Shoulder adduction     Shoulder internal rotation PROM 50  Full    Shoulder external rotation PROM 40  Full    Elbow flexion Full     Elbow extension -30    Wrist flexion     Wrist extension     Wrist ulnar deviation     Wrist radial deviation     Wrist pronation     Wrist supination     (Blank rows = not tested)  UPPER EXTREMITY MMT:Nt on Rt GH joint, appears 3-/5   MMT Right eval Left Eval WNL   Shoulder flexion    Shoulder extension    Shoulder abduction    Shoulder adduction    Shoulder internal rotation    Shoulder external rotation    Middle trapezius    Lower trapezius    Elbow flexion 4   Elbow extension 4-   Wrist flexion    Wrist extension    Wrist ulnar deviation    Wrist radial deviation    Wrist pronation    Wrist supination    Grip strength (lbs)    (Blank rows = not tested)  PALPATION:  Steristrips in place, pain along anterior aspect of shoulder , some laterally and along clavicle  TREATMENT DATE:   [facility] Adult PT Treatment:           5 weeks  s/p Sx                                     DATE: 10/04/23 Therapeutic Exercise: Seated flexion on table x10  Seated scaption on table x10 Isometric Shoulder Abduction, flexion, and ER 10 reps - 5 hold 50% effort Standing Scapular Retraction 10 reps - 5 hold Supine Shoulder Flexion Extension AAROM with Dowel 10 reps - 5 hold HEP updated Manual Therapy: PROM for shoulder flex, abd , ER at 30d abd  [facility] Adult PT Treatment:                                                DATE: 09/21/23  Self Care: HEP, protocol, precautions  - no charge    PATIENT EDUCATION: Education details: Precautions, HEP  and AAROM, POC  Person educated: Patient Education method: Explanation, Demonstration, Verbal cues, and Handouts Education comprehension: verbalized understanding, returned demonstration, and needs further education  HOME EXERCISE PROGRAM: Access Code: [email or web address] URL: [email or web address] Date: 10/06/2023 Prepared by: Joellyn Rued  Exercises - Isometric Shoulder Abduction at Wall  - 1-3 x daily - 7 x weekly - 2 sets - 10 reps - 5 hold - Standing Isometric Shoulder External Rotation with Doorway (Mirrored)  - 1 x daily - 7 x weekly - 2 sets - 10 reps - 5 hold - Isometric Shoulder Flexion at Wall (Mirrored)  - 1-3 x daily - 7 x weekly - 2 sets - 10 reps - 5 hold - Standing Scapular Retraction  - 1-3 x daily - 7 x weekly - 2 sets - 10 reps - 5 hold - Supine Shoulder Flexion Extension AAROM with Dowel  - 1-3 x daily - 7 x weekly - 2 sets - 10 reps - 5 hold - Seated Shoulder Flexion Towel Slide at Table Top Full Range of Motion  - 1-3 x daily - 7 x weekly - 2 sets - 10 reps - Seated Shoulder Scaption Slide at Table Top with Forearm in Neutral  - 1-3 x daily - 7 x weekly - 2 sets - 10 reps  ASSESSMENT:  CLINICAL IMPRESSION: PT was provided for ROM and submaximal for the R shoulder following protocol for 5 weeks s/p rTSA. AAROM for shoulder flexion has made good progress since the  initial visit. Pt demonstrates good understanding of his HEP. Isometric exs were expanded to include flexion and ER. Pt tolerated PT today without adverse effects. Pt will continue to benefit from skilled PT to address impairments for improved R shoulder function.    EVAL: Patient is a 63 y.o. male who was seen today for physical therapy evaluation and treatment for Reverse TSR on 08/31/23.  Overall patient is doing quite well despite reported moderate to severe pain on a constant basis.  Patient will benefit from skilled physical therapy to work on progressive improvements in range of motion strength and overall mobility.  OBJECTIVE IMPAIRMENTS: decreased ROM, decreased strength, hypomobility, increased fascial restrictions, impaired UE functional use, postural dysfunction, and pain.   ACTIVITY LIMITATIONS: carrying, lifting, sleeping, reach over head, hygiene/grooming, and locomotion level  PARTICIPATION LIMITATIONS: cleaning, driving, community activity, and recreation   PERSONAL FACTORS:  3+ comorbidities: Rt elbow stiffness, lumbar surgeries, chronic pain   are also affecting patient's functional outcome.   REHAB POTENTIAL: Excellent  CLINICAL DECISION MAKING: Stable/uncomplicated  EVALUATION COMPLEXITY: Low   GOALS: Goals reviewed with patient? Yes  SHORT TERM GOALS: Target date: 10/19/2023    Patient will be independent with initial home exercise program for active range of motion and strength Baseline: Given on evaluation Goal status: INITIAL  2.  Patient will be able to complete ADLs without increased pain including bathing dressing and hygiene Baseline: Pain is moderate to severe Goal status: INITIAL  3.  Patient will require min to no cueing to reduce compensation and shoulder height with shoulder flexion and abduction Baseline:  Goal status: INITIAL  LONG TERM GOALS: Target date: 11/16/2023    Patient will be independent with home exercise program upon  discharge Baseline:  Goal status: INITIAL  2.  Patient will demonstrate 4/5 shoulder strength in order to resume recreational activities Baseline:  Goal status: INITIAL  3.  Patient will demonstrate full active range of motion in all planes with pain less than a 4 out of 10 Baseline:  Goal status: INITIAL  4.  Patient will be able to return to painting and mechanical work on his motorcycle without increasing pain Baseline:  Goal status: INITIAL  5.  QuickDASH score will improve by 10 point as a proxy for improved shoulder function Baseline: 39/55 Goal status: INITIAL   PLAN:  PT FREQUENCY: 2x/week  PT DURATION: 8 weeks  PLANNED INTERVENTIONS: [id number]- PT Re-evaluation, 97110-Therapeutic exercises, 97530- Therapeutic activity, 97112- Neuromuscular re-education, 97535- Self Care, [id number]- Manual therapy, 97016- Vasopneumatic device, Patient/Family education, Joint mobilization, Cryotherapy, and Moist heat  PLAN FOR NEXT SESSION: Patient is allowed to begin deltoid isometrics follow protocol increasing shoulder passive range of motion and progressing into active range of motion modalities as needed for pain, manual    Karla Pavone MS, PT 10/06/23 3:59 PM

## 2023-10-09 ENCOUNTER — Telehealth (HOSPITAL_COMMUNITY): Payer: Self-pay

## 2023-10-09 ENCOUNTER — Telehealth: Payer: Self-pay | Admitting: Physical Therapy

## 2023-10-09 ENCOUNTER — Ambulatory Visit: Payer: MEDICAID | Admitting: Physical Therapy

## 2023-10-09 NOTE — Therapy (Deleted)
 OUTPATIENT PHYSICAL THERAPY SHOULDER TREATMENT   Patient Name: Keith Gibbs MRN: [id number] DOB:April 25, 1961, 63 y.o., male Today's Date: 10/09/2023  END OF SESSION:     Past Medical History:  Diagnosis Date   Anginal pain (HCC)    Anxiety    Arthritis    Bipolar 1 disorder (HCC)    Bursitis    CAD (coronary artery disease)    Chronic pain    COPD (chronic obstructive pulmonary disease) (HCC)    Current use of long term anticoagulation    DAPT (ASA + clopidogrel)   Depression    Diverticulitis    Dyspnea    GERD (gastroesophageal reflux disease)    Grade I diastolic dysfunction    Hepatitis C 2012   No longer has Hep C   HLD (hyperlipidemia)    Hypertension    MI (myocardial infarction) (HCC)    Polysubstance abuse (HCC)    cocaine, marijuana, ETOH   PUD (peptic ulcer disease)    S/P angioplasty with stent 06/10/2016   a.) 90% stenosis of pLAD to mLAD - 2.5 x 18 mm Xience Alpine (DES x 1) placed to pLAD   S/P PTCA (percutaneous transluminal coronary angioplasty) 12/04/2019   a.) 60% in stent restenosis of DES to pLAD; LVEF 65%.   Schizophrenia (HCC)    Stroke [facility])    Valvular insufficiency    a.) Mild MR, TR, PR; mild to moderate AR on 03/05/2018 TTE   Past Surgical History:  Procedure Laterality Date   ABDOMINAL SURGERY     removed small piece of intestines due to [facility] Diverticulosis   ANTERIOR LAT LUMBAR FUSION N/A 08/23/2022   Procedure: DIRECT LATERAL INTERBODY FUSION  LUMBAR TWO- LUMBAR THREE, LUMBAR THREE-LUMBAR FOUR , EXPLORE AND EXTEND FUSION LUMBAR TWO-LUMBAR FIVE, POSTERIOR DECOMPRESSION LUMBAR THREE-LUMBAR FOUR, LEFT LUMBAR TWO-LUMBAR THREE;  Surgeon: Bedelia Person, MD;  Location: [facility] OR;  Service: Neurosurgery;  Laterality: N/A;   APPENDECTOMY     BACK SURGERY     CARDIAC CATHETERIZATION Left 06/10/2016   Procedure: Left Heart Cath and Coronary Angiography;  Surgeon: Laurier Nancy, MD;  Location: [facility] INVASIVE CV LAB;  Service:  Cardiovascular;  Laterality: Left;   CARDIAC CATHETERIZATION N/A 06/10/2016   Procedure: Coronary Stent Intervention;  Surgeon: Alwyn Pea, MD;  Location: [facility] INVASIVE CV LAB;  Service: Cardiovascular;  Laterality: N/A;   CHOLECYSTECTOMY N/A 02/17/2022   Procedure: LAPAROSCOPIC CHOLECYSTECTOMY;  Surgeon: Sheliah Hatch De Blanch, MD;  Location: [facility] OR;  Service: General;  Laterality: N/A;   COLON SURGERY     COLONOSCOPY     COLONOSCOPY WITH PROPOFOL N/A 01/05/2017   Procedure: COLONOSCOPY WITH PROPOFOL;  Surgeon: Wyline Mood, MD;  Location: [facility] ENDOSCOPY;  Service: Endoscopy;  Laterality: N/A;   COLONOSCOPY WITH PROPOFOL N/A 02/13/2020   Procedure: COLONOSCOPY WITH PROPOFOL;  Surgeon: Pasty Spillers, MD;  Location: [facility] ENDOSCOPY;  Service: Endoscopy;  Laterality: N/A;   CORONARY ANGIOPLASTY WITH STENT PLACEMENT     CORONARY PRESSURE/FFR STUDY N/A 12/04/2019   Procedure: INTRAVASCULAR PRESSURE WIRE/FFR STUDY;  Surgeon: Tonny Bollman, MD;  Location: [facility] INVASIVE CV LAB;  Service: Cardiovascular;  Laterality: N/A;   ESOPHAGOGASTRODUODENOSCOPY (EGD) WITH PROPOFOL N/A 01/05/2017   Procedure: ESOPHAGOGASTRODUODENOSCOPY (EGD) WITH PROPOFOL;  Surgeon: Wyline Mood, MD;  Location: [facility] ENDOSCOPY;  Service: Endoscopy;  Laterality: N/A;   ESOPHAGOGASTRODUODENOSCOPY (EGD) WITH PROPOFOL N/A 02/13/2020   Procedure: ESOPHAGOGASTRODUODENOSCOPY (EGD) WITH PROPOFOL;  Surgeon: Pasty Spillers, MD;  Location: [facility] ENDOSCOPY;  Service: Endoscopy;  Laterality: N/A;  KNEE ARTHROSCOPY WITH MEDIAL MENISECTOMY Right 09/05/2017   Procedure: KNEE ARTHROSCOPY WITH MEDIAL AND LATERAL  MENISECTOMY PARTIAL SYNOVECTOMY;  Surgeon: Kennedy Bucker, MD;  Location: [facility] ORS;  Service: Orthopedics;  Laterality: Right;   LEFT HEART CATH AND CORONARY ANGIOGRAPHY N/A 12/04/2019   Procedure: LEFT HEART CATH AND CORONARY ANGIOGRAPHY;  Surgeon: Tonny Bollman, MD;  Location: [facility] INVASIVE CV LAB;  Service: Cardiovascular;   Laterality: N/A;   LEFT HEART CATH AND CORONARY ANGIOGRAPHY Left 11/25/2022   Procedure: LEFT HEART CATH AND CORONARY ANGIOGRAPHY;  Surgeon: Laurier Nancy, MD;  Location: [facility] INVASIVE CV LAB;  Service: Cardiovascular;  Laterality: Left;   REVERSE SHOULDER ARTHROPLASTY Right 08/31/2023   Procedure: RIGHT REVERSE SHOULDER ARTHROPLASTY;  Surgeon: Cammy Copa, MD;  Location: [facility] OR;  Service: Orthopedics;  Laterality: Right;   SHOULDER SURGERY Right 04/09/2012   SPINE SURGERY     TOTAL KNEE ARTHROPLASTY Right 01/19/2021   Procedure: TOTAL KNEE ARTHROPLASTY - [place] Neighbor to Assist;  Surgeon: Kennedy Bucker, MD;  Location: [facility] ORS;  Service: Orthopedics;  Laterality: Right;   Patient Active Problem List   Diagnosis Date Noted   Shoulder arthritis 08/31/2023   Lumbar radiculopathy 05/30/2023   Major depressive disorder, recurrent severe without psychotic features (HCC) 01/28/2023   S/P lumbar fusion 08/23/2022   Postlaminectomy syndrome, not elsewhere classified 08/12/2022   MDD (major depressive disorder) 08/10/2022   Substance induced mood disorder (HCC) 08/06/2022   Suicide attempt by drug overdose (HCC) 08/06/2022   Toxic encephalopathy 08/03/2022   Suicide ideation 08/03/2022   Encephalopathy 08/03/2022   Polysubstance abuse (HCC) 04/28/2022   Postoperative abdominal pain 02/28/2022   Acute cholecystitis 02/17/2022   Obstruction of nasal valve 01/07/2022   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   Pulmonary nodule 12/17/2021   CAD S/P percutaneous coronary angioplasty 12/17/2021   Epigastric abdominal pain    Nausea and vomiting    Tobacco dependence due to cigarettes 08/31/2021   Iron deficiency anemia secondary to inadequate dietary iron intake 08/31/2021   Acute lead-induced gout involving toe of left foot 08/31/2021   Encounter for lipid screening for cardiovascular disease 08/31/2021   Hypotension due to drugs 06/21/2021   Opioid withdrawal (HCC) 06/21/2021   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HCC)  06/21/2021   RLS (restless legs syndrome) 05/07/2021   Acquired deviated nasal septum 05/03/2021   Encounter for surveillance of abnormal nevi 05/03/2021   Flu vaccine need 05/03/2021   Substance abuse (HCC) 05/03/2021   Drug abuse and dependence (HCC) 05/03/2021   Need for shingles vaccine 05/03/2021   Right groin pain 01/29/2021   S/P TKR (total knee replacement) using cement, right 01/19/2021   Cigarette smoker 07/21/2020   Foot pain, bilateral 12/25/2019   Potential exposure to STD 12/12/2019   History of lumbar surgery 12/12/2019   Fatigue 12/12/2019   Penile lesion 12/12/2019   Right testicular pain 12/12/2019   Body mass index 28.0-28.9, adult 12/12/2019   History of COPD 12/12/2019   Tobacco use disorder, continuous 12/12/2019   Hepatoma (HCC) 11/05/2019   Anxiety 12/10/2018   Essential hypertension 09/18/2018   Hypercholesteremia 09/18/2018   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   Chronic anticoagulation (Plavix) 04/19/2018   Pain medication agreement broken ([facility]) 04/19/2018   Cocaine use 04/18/2018   Cocaine abuse (HCC) (See 04/12/18 UDS) 04/18/2018   Marijuana abuse (See 04/12/18 UDS) 04/18/2018   Long term current use of opiate analgesic 04/12/2018   Cirrhosis of liver (HCC) 04/03/2018   BPH (benign prostatic hyperplasia) 04/03/2018   Hematuria, microscopic  04/03/2018   Osteoarthritis of the knee (Left) 11/02/2017   Lumbar facet syndrome (Bilateral) 11/01/2017   Spondylosis without myelopathy or radiculopathy, lumbosacral region 11/01/2017   Osteoarthritis of shoulder (Right) 11/01/2017   Osteoarthritis of acromioclavicular joint (Right) 11/01/2017   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) 11/01/2017   Osteoarthritis 11/01/2017   Cervicalgia 11/01/2017   Cervical facet syndrome 11/01/2017   DDD (degenerative disc disease), cervical 11/01/2017   DDD (degenerative disc disease), lumbar 11/01/2017   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)  09/18/2017   Other chronic pain 09/18/2017   Chronic musculoskeletal pain 09/18/2017   Chronic pain of right knee 09/11/2017   Chronic low back pain (Secondary Area of Pain) (Bilateral) 09/11/2017   Chronic hip pain (Tertiary Area of Pain) (Bilateral) (R>L) 09/11/2017   Chronic shoulder pain (Fourth Area of Pain) (Right) 09/11/2017   Spondylosis without myelopathy or radiculopathy, cervical region 09/11/2017   Chronic pain syndrome 09/11/2017   Opiate use 09/11/2017   Screen for STD (sexually transmitted disease) 09/11/2017   Disorder of skeletal system 09/11/2017   Problems influencing health status 09/11/2017   Overdose of medication, intentional self-harm, sequela (HCC) 12/28/2016   Severe recurrent major depression without psychotic features (HCC) 12/27/2016   Alcohol use disorder, moderate, dependence (HCC) 12/27/2016   Cannabis use disorder, moderate, dependence (HCC) 12/27/2016   Chest pain 06/10/2016   Bipolar 1 disorder (HCC) 05/27/2013   Myalgia 05/27/2013   Abnormal ejaculation 11/30/2012   Chest pain, unspecified 11/30/2012   Degenerative arthritis of hip 11/16/2012   Schizoaffective disorder (HCC) 06/27/2012   COPD (chronic obstructive pulmonary disease) (HCC) 06/27/2012   Chronic hepatitis C (HCC) 06/27/2012   GERD (gastroesophageal reflux disease) 06/27/2012   Complete rupture of rotator cuff 02/10/2012   Cocaine abuse in remission (HCC) 12/05/2011   Unspecified osteoarthritis, unspecified site 07/27/2011    PCP: Rema Fendt NP   REFERRING PROVIDER: Julieanne Cotton, PA-C  REFERRING DIAG: M25.511 (ICD-10-CM) - Acute pain of right shoulder Z96.611 (ICD-10-CM) - History of arthroplasty of right shoulder  THERAPY DIAG:  No diagnosis found.  Rationale for Evaluation and Treatment: Rehabilitation  ONSET DATE: 08/31/2023   SUBJECTIVE:                                                                                                                                                                                       SUBJECTIVE STATEMENT: Pt reports his R shoulder is doing well. He is completing his HEP daily.  EVAL: Pt underwent Rt RTSR on 08/31/23.  Patient recently saw the doctor who was pleased with his progress so far.  Pt reports frustration with his physical limitations. He  is not working right now, disabled.  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  Patient reports his pain limits his ability to sleep, work on his motorcycle and other home tasks.  He recently discontinued the use of the sling but does still use pillows to help get comfortable.  He does report couple of days ago he tripped while walking up the stairs and had to catch himself on both hands.  Hand dominance: Right  PERTINENT HISTORY: Patient has had multiple surgeries including lumbar fusion, knee arthroscopy, osteoarthritis COPD and cardiac stent. Substance abuse.   PAIN:  Are you having pain? Yes: NPRS scale: 5/10 Pain location: Rt ant shoulder  Pain description: sore and achy Aggravating factors: constant , worse with using it  Relieving factors: rest, meds, ice   PRECAUTIONS: Other: protocol: no ER past 30 deg  RED FLAGS: None   WEIGHT BEARING RESTRICTIONS: No  FALLS:  Has patient fallen in last 6 months? Yes, 1 time yesterday, walking up the stairs  LIVING ENVIRONMENT: Lives with: lives with their familymoved back in with his mom after being widowed  Lives in: House/apartment Stairs: Yes: External: no issues  steps; NA  Has following equipment at home: None  OCCUPATION: Disabled  PLOF: Independent with basic ADLs, Vocation/Vocational requirements: NA, and Leisure: likes to use his hands , takes his motorcycle out and paint   PATIENT GOALS:I would like to ride my motorcycle again.   NEXT MD VISIT:   OBJECTIVE:  Note: Objective measures were completed at Evaluation unless otherwise noted.  DIAGNOSTIC FINDINGS:  None   PATIENT SURVEYS:  Quick Dash  40/55  COGNITION: Overall cognitive status: Within functional limits for tasks assessed     SENSATION: WFL  POSTURE: Rounded shoulders, elbow flexed   UPPER EXTREMITY ROM:   Active ROM Right eval Left eval Rt 10/06/23  Shoulder flexion 110 160 AA 130  Shoulder extension     Shoulder abduction 90 Full    Shoulder adduction     Shoulder internal rotation PROM 50  Full    Shoulder external rotation PROM 40  Full    Elbow flexion Full     Elbow extension -30    Wrist flexion     Wrist extension     Wrist ulnar deviation     Wrist radial deviation     Wrist pronation     Wrist supination     (Blank rows = not tested)  UPPER EXTREMITY MMT:Nt on Rt GH joint, appears 3-/5   MMT Right eval Left Eval WNL   Shoulder flexion    Shoulder extension    Shoulder abduction    Shoulder adduction    Shoulder internal rotation    Shoulder external rotation    Middle trapezius    Lower trapezius    Elbow flexion 4   Elbow extension 4-   Wrist flexion    Wrist extension    Wrist ulnar deviation    Wrist radial deviation    Wrist pronation    Wrist supination    Grip strength (lbs)    (Blank rows = not tested)  PALPATION:  Steristrips in place, pain along anterior aspect of shoulder , some laterally and along clavicle  TREATMENT DATE:   [facility] Adult PT Treatment:                                                DATE: 10/09/23 Therapeutic Exercise: *** Manual Therapy: *** Neuromuscular re-ed: *** Therapeutic Activity: *** Modalities: *** Self Care: ***  [facility] Adult PT Treatment:           5 weeks s/p Sx                                     DATE: 10/04/23 Therapeutic Exercise: Seated flexion on table x10  Seated scaption on table x10 Isometric Shoulder Abduction, flexion, and ER 10 reps - 5 hold 50% effort Standing Scapular Retraction 10 reps - 5  hold Supine Shoulder Flexion Extension AAROM with Dowel 10 reps - 5 hold HEP updated Manual Therapy: PROM for shoulder flex, abd , ER at 30d abd  [facility] Adult PT Treatment:                                                DATE: 09/21/23  Self Care: HEP, protocol, precautions  - no charge    PATIENT EDUCATION: Education details: Precautions, HEP  and AAROM, POC  Person educated: Patient Education method: Explanation, Demonstration, Verbal cues, and Handouts Education comprehension: verbalized understanding, returned demonstration, and needs further education  HOME EXERCISE PROGRAM: Access Code: [email or web address] URL: [email or web address] Date: 10/06/2023 Prepared by: Joellyn Rued  Exercises - Isometric Shoulder Abduction at Wall  - 1-3 x daily - 7 x weekly - 2 sets - 10 reps - 5 hold - Standing Isometric Shoulder External Rotation with Doorway (Mirrored)  - 1 x daily - 7 x weekly - 2 sets - 10 reps - 5 hold - Isometric Shoulder Flexion at Wall (Mirrored)  - 1-3 x daily - 7 x weekly - 2 sets - 10 reps - 5 hold - Standing Scapular Retraction  - 1-3 x daily - 7 x weekly - 2 sets - 10 reps - 5 hold - Supine Shoulder Flexion Extension AAROM with Dowel  - 1-3 x daily - 7 x weekly - 2 sets - 10 reps - 5 hold - Seated Shoulder Flexion Towel Slide at Table Top Full Range of Motion  - 1-3 x daily - 7 x weekly - 2 sets - 10 reps - Seated Shoulder Scaption Slide at Table Top with Forearm in Neutral  - 1-3 x daily - 7 x weekly - 2 sets - 10 reps  ASSESSMENT:  CLINICAL IMPRESSION: PT was provided for ROM and submaximal for the R shoulder following protocol for 5 weeks s/p rTSA. AAROM for shoulder flexion has made good progress since the initial visit. Pt demonstrates good understanding of his HEP. Isometric exs were expanded to include flexion and ER. Pt tolerated PT today without adverse effects. Pt will continue to benefit from skilled PT to address impairments for improved R shoulder  function.    EVAL: Patient is a 63 y.o. male who was seen today for physical therapy evaluation and treatment for Reverse TSR on 08/31/23.  Overall patient is doing quite well despite reported moderate to severe pain  on a constant basis.  Patient will benefit from skilled physical therapy to work on progressive improvements in range of motion strength and overall mobility.  OBJECTIVE IMPAIRMENTS: decreased ROM, decreased strength, hypomobility, increased fascial restrictions, impaired UE functional use, postural dysfunction, and pain.   ACTIVITY LIMITATIONS: carrying, lifting, sleeping, reach over head, hygiene/grooming, and locomotion level  PARTICIPATION LIMITATIONS: cleaning, driving, community activity, and recreation   PERSONAL FACTORS: 3+ comorbidities: Rt elbow stiffness, lumbar surgeries, chronic pain   are also affecting patient's functional outcome.   REHAB POTENTIAL: Excellent  CLINICAL DECISION MAKING: Stable/uncomplicated  EVALUATION COMPLEXITY: Low   GOALS: Goals reviewed with patient? Yes  SHORT TERM GOALS: Target date: 10/19/2023    Patient will be independent with initial home exercise program for active range of motion and strength Baseline: Given on evaluation Goal status: INITIAL  2.  Patient will be able to complete ADLs without increased pain including bathing dressing and hygiene Baseline: Pain is moderate to severe Goal status: INITIAL  3.  Patient will require min to no cueing to reduce compensation and shoulder height with shoulder flexion and abduction Baseline:  Goal status: INITIAL  LONG TERM GOALS: Target date: 11/16/2023    Patient will be independent with home exercise program upon discharge Baseline:  Goal status: INITIAL  2.  Patient will demonstrate 4/5 shoulder strength in order to resume recreational activities Baseline:  Goal status: INITIAL  3.  Patient will demonstrate full active range of motion in all planes with pain less than a 4  out of 10 Baseline:  Goal status: INITIAL  4.  Patient will be able to return to painting and mechanical work on his motorcycle without increasing pain Baseline:  Goal status: INITIAL  5.  QuickDASH score will improve by 10 point as a proxy for improved shoulder function Baseline: 39/55 Goal status: INITIAL   PLAN:  PT FREQUENCY: 2x/week  PT DURATION: 8 weeks  PLANNED INTERVENTIONS: 97164- PT Re-evaluation, 97110-Therapeutic exercises, 97530- Therapeutic activity, 97112- Neuromuscular re-education, 97535- Self Care, [id number]- Manual therapy, 97016- Vasopneumatic device, Patient/Family education, Joint mobilization, Cryotherapy, and Moist heat  PLAN FOR NEXT SESSION: Patient is allowed to begin deltoid isometrics follow protocol increasing shoulder passive range of motion and progressing into active range of motion modalities as needed for pain, manual    Allen Ralls MS, PT 10/09/23 7:49 AM

## 2023-10-09 NOTE — Telephone Encounter (Addendum)
 Pt/ Pharmacy is requesting for a refill of   QUEtiapine (SEROQUEL) 200 MG tablet and ATARAX 25mg    To be sent in. Last seen 2/21  Pt is also requesting for a refill of Trazadone too be sent to pharmacy.

## 2023-10-09 NOTE — Telephone Encounter (Signed)
 Patient missed his appointment today at 930.  PT called patient's home and was unable to reach the patient, left a voicemail offering an appointment later that same day or others during the week.  Reminded of the attendance policy.  Reminded also of his appointment time tomorrow. Karie Mainland, PT 10/09/23 9:55 AM Phone: [phone] Fax: [phone]

## 2023-10-10 ENCOUNTER — Other Ambulatory Visit: Payer: Self-pay | Admitting: Surgical

## 2023-10-10 ENCOUNTER — Telehealth: Payer: Self-pay | Admitting: Orthopedic Surgery

## 2023-10-10 ENCOUNTER — Ambulatory Visit: Payer: MEDICAID

## 2023-10-10 DIAGNOSIS — M6281 Muscle weakness (generalized): Secondary | ICD-10-CM | POA: Diagnosis present

## 2023-10-10 DIAGNOSIS — Z96611 Presence of right artificial shoulder joint: Secondary | ICD-10-CM | POA: Diagnosis present

## 2023-10-10 DIAGNOSIS — M25611 Stiffness of right shoulder, not elsewhere classified: Secondary | ICD-10-CM

## 2023-10-10 MED ORDER — OXYCODONE HCL 5 MG PO TABS: 5.0000 mg | ORAL_TABLET | Freq: Two times a day (BID) | ORAL | 0 refills | Status: DC | PRN

## 2023-10-10 NOTE — Telephone Encounter (Signed)
 Rx refill Oxycodone   [facility] @ [address]

## 2023-10-10 NOTE — Therapy (Signed)
 OUTPATIENT PHYSICAL THERAPY SHOULDER TREATMENT   Patient Name: Keith Gibbs MRN: [id number] DOB:25-Jan-1961, 63 y.o., male Today's Date: 10/10/2023  END OF SESSION:  PT End of Session - 10/10/23 1108     Visit Number 3    Number of Visits 17    Date for PT Re-Evaluation 11/16/23    Authorization Type [employer]    Authorization Time Period Approved 12 visits 09/25/23-11/11/23 [id number]    PT Start Time 1100    PT Stop Time 1143    PT Time Calculation (min) 43 min    Activity Tolerance Patient tolerated treatment well    Behavior During Therapy WFL for tasks assessed/performed               Past Medical History:  Diagnosis Date   Anginal pain (HCC)    Anxiety    Arthritis    Bipolar 1 disorder (HCC)    Bursitis    CAD (coronary artery disease)    Chronic pain    COPD (chronic obstructive pulmonary disease) (HCC)    Current use of long term anticoagulation    DAPT (ASA + clopidogrel)   Depression    Diverticulitis    Dyspnea    GERD (gastroesophageal reflux disease)    Grade I diastolic dysfunction    Hepatitis C 2012   No longer has Hep C   HLD (hyperlipidemia)    Hypertension    MI (myocardial infarction) (HCC)    Polysubstance abuse (HCC)    cocaine, marijuana, ETOH   PUD (peptic ulcer disease)    S/P angioplasty with stent 06/10/2016   a.) 90% stenosis of pLAD to mLAD - 2.5 x 18 mm Xience Alpine (DES x 1) placed to pLAD   S/P PTCA (percutaneous transluminal coronary angioplasty) 12/04/2019   a.) 60% in stent restenosis of DES to pLAD; LVEF 65%.   Schizophrenia (HCC)    Stroke [facility]    Valvular insufficiency    a.) Mild MR, TR, PR; mild to moderate AR on 03/05/2018 TTE   Past Surgical History:  Procedure Laterality Date   ABDOMINAL SURGERY     removed small piece of intestines due to [facility] Diverticulosis   ANTERIOR LAT LUMBAR FUSION N/A 08/23/2022   Procedure: DIRECT LATERAL INTERBODY FUSION  LUMBAR TWO- LUMBAR THREE, LUMBAR  THREE-LUMBAR FOUR , EXPLORE AND EXTEND FUSION LUMBAR TWO-LUMBAR FIVE, POSTERIOR DECOMPRESSION LUMBAR THREE-LUMBAR FOUR, LEFT LUMBAR TWO-LUMBAR THREE;  Surgeon: Bedelia Person, MD;  Location: [facility] OR;  Service: Neurosurgery;  Laterality: N/A;   APPENDECTOMY     BACK SURGERY     CARDIAC CATHETERIZATION Left 06/10/2016   Procedure: Left Heart Cath and Coronary Angiography;  Surgeon: Laurier Nancy, MD;  Location: [facility] INVASIVE CV LAB;  Service: Cardiovascular;  Laterality: Left;   CARDIAC CATHETERIZATION N/A 06/10/2016   Procedure: Coronary Stent Intervention;  Surgeon: Alwyn Pea, MD;  Location: [facility] INVASIVE CV LAB;  Service: Cardiovascular;  Laterality: N/A;   CHOLECYSTECTOMY N/A 02/17/2022   Procedure: LAPAROSCOPIC CHOLECYSTECTOMY;  Surgeon: Sheliah Hatch De Blanch, MD;  Location: [facility] OR;  Service: General;  Laterality: N/A;   COLON SURGERY     COLONOSCOPY     COLONOSCOPY WITH PROPOFOL N/A 01/05/2017   Procedure: COLONOSCOPY WITH PROPOFOL;  Surgeon: Wyline Mood, MD;  Location: [facility] ENDOSCOPY;  Service: Endoscopy;  Laterality: N/A;   COLONOSCOPY WITH PROPOFOL N/A 02/13/2020   Procedure: COLONOSCOPY WITH PROPOFOL;  Surgeon: Pasty Spillers, MD;  Location: [facility] ENDOSCOPY;  Service: Endoscopy;  Laterality: N/A;  CORONARY ANGIOPLASTY WITH STENT PLACEMENT     CORONARY PRESSURE/FFR STUDY N/A 12/04/2019   Procedure: INTRAVASCULAR PRESSURE WIRE/FFR STUDY;  Surgeon: Tonny Bollman, MD;  Location: [facility] INVASIVE CV LAB;  Service: Cardiovascular;  Laterality: N/A;   ESOPHAGOGASTRODUODENOSCOPY (EGD) WITH PROPOFOL N/A 01/05/2017   Procedure: ESOPHAGOGASTRODUODENOSCOPY (EGD) WITH PROPOFOL;  Surgeon: Wyline Mood, MD;  Location: [facility] ENDOSCOPY;  Service: Endoscopy;  Laterality: N/A;   ESOPHAGOGASTRODUODENOSCOPY (EGD) WITH PROPOFOL N/A 02/13/2020   Procedure: ESOPHAGOGASTRODUODENOSCOPY (EGD) WITH PROPOFOL;  Surgeon: Pasty Spillers, MD;  Location: [facility] ENDOSCOPY;  Service: Endoscopy;  Laterality:  N/A;   KNEE ARTHROSCOPY WITH MEDIAL MENISECTOMY Right 09/05/2017   Procedure: KNEE ARTHROSCOPY WITH MEDIAL AND LATERAL  MENISECTOMY PARTIAL SYNOVECTOMY;  Surgeon: Kennedy Bucker, MD;  Location: [facility] ORS;  Service: Orthopedics;  Laterality: Right;   LEFT HEART CATH AND CORONARY ANGIOGRAPHY N/A 12/04/2019   Procedure: LEFT HEART CATH AND CORONARY ANGIOGRAPHY;  Surgeon: Tonny Bollman, MD;  Location: [facility] INVASIVE CV LAB;  Service: Cardiovascular;  Laterality: N/A;   LEFT HEART CATH AND CORONARY ANGIOGRAPHY Left 11/25/2022   Procedure: LEFT HEART CATH AND CORONARY ANGIOGRAPHY;  Surgeon: Laurier Nancy, MD;  Location: [facility] INVASIVE CV LAB;  Service: Cardiovascular;  Laterality: Left;   REVERSE SHOULDER ARTHROPLASTY Right 08/31/2023   Procedure: RIGHT REVERSE SHOULDER ARTHROPLASTY;  Surgeon: Cammy Copa, MD;  Location: [facility] OR;  Service: Orthopedics;  Laterality: Right;   SHOULDER SURGERY Right 04/09/2012   SPINE SURGERY     TOTAL KNEE ARTHROPLASTY Right 01/19/2021   Procedure: TOTAL KNEE ARTHROPLASTY - [place] Neighbor to Assist;  Surgeon: Kennedy Bucker, MD;  Location: [facility] ORS;  Service: Orthopedics;  Laterality: Right;   Patient Active Problem List   Diagnosis Date Noted   Shoulder arthritis 08/31/2023   Lumbar radiculopathy 05/30/2023   Major depressive disorder, recurrent severe without psychotic features (HCC) 01/28/2023   S/P lumbar fusion 08/23/2022   Postlaminectomy syndrome, not elsewhere classified 08/12/2022   MDD (major depressive disorder) 08/10/2022   Substance induced mood disorder (HCC) 08/06/2022   Suicide attempt by drug overdose (HCC) 08/06/2022   Toxic encephalopathy 08/03/2022   Suicide ideation 08/03/2022   Encephalopathy 08/03/2022   Polysubstance abuse (HCC) 04/28/2022   Postoperative abdominal pain 02/28/2022   Acute cholecystitis 02/17/2022   Obstruction of nasal valve 01/07/2022   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   Pulmonary nodule 12/17/2021   CAD S/P percutaneous coronary  angioplasty 12/17/2021   Epigastric abdominal pain    Nausea and vomiting    Tobacco dependence due to cigarettes 08/31/2021   Iron deficiency anemia secondary to inadequate dietary iron intake 08/31/2021   Acute lead-induced gout involving toe of left foot 08/31/2021   Encounter for lipid screening for cardiovascular disease 08/31/2021   Hypotension due to drugs 06/21/2021   Opioid withdrawal (HCC) 06/21/2021   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HCC) 06/21/2021   RLS (restless legs syndrome) 05/07/2021   Acquired deviated nasal septum 05/03/2021   Encounter for surveillance of abnormal nevi 05/03/2021   Flu vaccine need 05/03/2021   Substance abuse (HCC) 05/03/2021   Drug abuse and dependence (HCC) 05/03/2021   Need for shingles vaccine 05/03/2021   Right groin pain 01/29/2021   S/P TKR (total knee replacement) using cement, right 01/19/2021   Cigarette smoker 07/21/2020   Foot pain, bilateral 12/25/2019   Potential exposure to STD 12/12/2019   History of lumbar surgery 12/12/2019   Fatigue 12/12/2019   Penile lesion 12/12/2019   Right testicular pain 12/12/2019   Body mass index 28.0-28.9, adult 12/12/2019  History of COPD 12/12/2019   Tobacco use disorder, continuous 12/12/2019   Hepatoma (HCC) 11/05/2019   Anxiety 12/10/2018   Essential hypertension 09/18/2018   Hypercholesteremia 09/18/2018   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   Chronic anticoagulation (Plavix) 04/19/2018   Pain medication agreement broken ([facility]) 04/19/2018   Cocaine use 04/18/2018   Cocaine abuse (HCC) (See 04/12/18 UDS) 04/18/2018   Marijuana abuse (See 04/12/18 UDS) 04/18/2018   Long term current use of opiate analgesic 04/12/2018   Cirrhosis of liver (HCC) 04/03/2018   BPH (benign prostatic hyperplasia) 04/03/2018   Hematuria, microscopic 04/03/2018   Osteoarthritis of the knee (Left) 11/02/2017   Lumbar facet syndrome (Bilateral) 11/01/2017   Spondylosis without myelopathy or radiculopathy, lumbosacral  region 11/01/2017   Osteoarthritis of shoulder (Right) 11/01/2017   Osteoarthritis of acromioclavicular joint (Right) 11/01/2017   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) 11/01/2017   Osteoarthritis 11/01/2017   Cervicalgia 11/01/2017   Cervical facet syndrome 11/01/2017   DDD (degenerative disc disease), cervical 11/01/2017   DDD (degenerative disc disease), lumbar 11/01/2017   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) 09/18/2017   Other chronic pain 09/18/2017   Chronic musculoskeletal pain 09/18/2017   Chronic pain of right knee 09/11/2017   Chronic low back pain (Secondary Area of Pain) (Bilateral) 09/11/2017   Chronic hip pain (Tertiary Area of Pain) (Bilateral) (R>L) 09/11/2017   Chronic shoulder pain (Fourth Area of Pain) (Right) 09/11/2017   Spondylosis without myelopathy or radiculopathy, cervical region 09/11/2017   Chronic pain syndrome 09/11/2017   Opiate use 09/11/2017   Screen for STD (sexually transmitted disease) 09/11/2017   Disorder of skeletal system 09/11/2017   Problems influencing health status 09/11/2017   Overdose of medication, intentional self-harm, sequela (HCC) 12/28/2016   Severe recurrent major depression without psychotic features (HCC) 12/27/2016   Alcohol use disorder, moderate, dependence (HCC) 12/27/2016   Cannabis use disorder, moderate, dependence (HCC) 12/27/2016   Chest pain 06/10/2016   Bipolar 1 disorder (HCC) 05/27/2013   Myalgia 05/27/2013   Abnormal ejaculation 11/30/2012   Chest pain, unspecified 11/30/2012   Degenerative arthritis of hip 11/16/2012   Schizoaffective disorder (HCC) 06/27/2012   COPD (chronic obstructive pulmonary disease) (HCC) 06/27/2012   Chronic hepatitis C (HCC) 06/27/2012   GERD (gastroesophageal reflux disease) 06/27/2012   Complete rupture of rotator cuff 02/10/2012   Cocaine abuse in remission (HCC) 12/05/2011   Unspecified osteoarthritis, unspecified site 07/27/2011    PCP: Rema Fendt NP    REFERRING PROVIDER: Julieanne Cotton, PA-C  REFERRING DIAG: M25.511 (ICD-10-CM) - Acute pain of right shoulder Z96.611 (ICD-10-CM) - History of arthroplasty of right shoulder  THERAPY DIAG:  S/P reverse total shoulder arthroplasty, right  Muscle weakness (generalized)  Stiffness of right shoulder, not elsewhere classified  Rationale for Evaluation and Treatment: Rehabilitation  ONSET DATE: 08/31/2023   SUBJECTIVE:  SUBJECTIVE STATEMENT: Pt reports he has been working on his motorcycle, which includes pushing it to/from the area whwere he works on it.  EVAL: Pt underwent Rt RTSR on 08/31/23.  Patient recently saw the doctor who was pleased with his progress so far.  Pt reports frustration with his physical limitations. He is not working right now, disabled.  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  Patient reports his pain limits his ability to sleep, work on his motorcycle and other home tasks.  He recently discontinued the use of the sling but does still use pillows to help get comfortable.  He does report couple of days ago he tripped while walking up the stairs and had to catch himself on both hands.  Hand dominance: Right  PERTINENT HISTORY: Patient has had multiple surgeries including lumbar fusion, knee arthroscopy, osteoarthritis COPD and cardiac stent. Substance abuse.   PAIN:  Are you having pain? Yes: NPRS scale: 5/10 Pain location: Rt ant shoulder  Pain description: sore and achy Aggravating factors: constant , worse with using it  Relieving factors: rest, meds, ice   PRECAUTIONS: Other: protocol: no ER past 30 deg  RED FLAGS: None   WEIGHT BEARING RESTRICTIONS: No  FALLS:  Has patient fallen in last 6 months? Yes, 1 time yesterday, walking up the stairs  LIVING ENVIRONMENT: Lives with: lives  with their familymoved back in with his mom after being widowed  Lives in: House/apartment Stairs: Yes: External: no issues  steps; NA  Has following equipment at home: None  OCCUPATION: Disabled  PLOF: Independent with basic ADLs, Vocation/Vocational requirements: NA, and Leisure: likes to use his hands , takes his motorcycle out and paint   PATIENT GOALS:I would like to ride my motorcycle again.   NEXT MD VISIT:   OBJECTIVE:  Note: Objective measures were completed at Evaluation unless otherwise noted.  DIAGNOSTIC FINDINGS:  None   PATIENT SURVEYS:  Quick Dash 40/55  COGNITION: Overall cognitive status: Within functional limits for tasks assessed     SENSATION: WFL  POSTURE: Rounded shoulders, elbow flexed   UPPER EXTREMITY ROM:   Active ROM Right eval Left eval Rt 10/06/23 RT 10/10/23  Shoulder flexion 110 160 AA 130 AA140  Shoulder extension      Shoulder abduction 90 Full     Shoulder adduction      Shoulder internal rotation PROM 50  Full     Shoulder external rotation PROM 40  Full     Elbow flexion Full      Elbow extension -30     Wrist flexion      Wrist extension      Wrist ulnar deviation      Wrist radial deviation      Wrist pronation      Wrist supination      (Blank rows = not tested)  UPPER EXTREMITY MMT:Nt on Rt GH joint, appears 3-/5   MMT Right eval Left Eval WNL   Shoulder flexion    Shoulder extension    Shoulder abduction    Shoulder adduction    Shoulder internal rotation    Shoulder external rotation    Middle trapezius    Lower trapezius    Elbow flexion 4   Elbow extension 4-   Wrist flexion    Wrist extension    Wrist ulnar deviation    Wrist radial deviation    Wrist pronation    Wrist supination    Grip strength (lbs)    (  Blank rows = not tested)  PALPATION:  Steristrips in place, pain along anterior aspect of shoulder , some laterally and along clavicle                                                                                                                               TREATMENT DATE:   [facility] Adult PT Treatment:             6 wks s/p Sx                                   DATE: 10/10/23 Therapeutic Exercise: Seated flexion on table x10, x5 10" Seated scaption on table x10, x5 10" Isometric Shoulder Abduction, flexion, and ER 10 reps - 5 hold 50% effort Standing shoulder rows Scapular Retraction 2x10 reps - 3hold RTB, to neutral Standing shoulder rows Scapular Ext 2x10 reps - 3hold RTB, to neutral Supine Shoulder Flexion AAROM with Dowel 10 reps - 5 hold Supine Shoulder ER AAROM with Dowel 10 reps - 5 hold, in 30d of abd Manual Therapy: PROM for shoulder flex, abd , ER at 30d abd Self Care: Pt advised against working on hs motorcycle until released by hs MD to do so  [facility] Adult PT Treatment:           5 weeks s/p Sx                                     DATE: 10/04/23 Therapeutic Exercise: Seated flexion on table x10  Seated scaption on table x10 Isometric Shoulder Abduction, flexion, and ER 10 reps - 5 hold 50% effort Standing Scapular Retraction 10 reps - 5 hold Supine Shoulder Flexion Extension AAROM with Dowel 10 reps - 5 hold HEP updated Manual Therapy: PROM for shoulder flex, abd , ER at 30d abd  [facility] Adult PT Treatment:                                                DATE: 09/21/23  Self Care: HEP, protocol, precautions  - no charge    PATIENT EDUCATION: Education details: Precautions, HEP  and AAROM, POC  Person educated: Patient Education method: Explanation, Demonstration, Verbal cues, and Handouts Education comprehension: verbalized understanding, returned demonstration, and needs further education  HOME EXERCISE PROGRAM: Access Code: [email or web address] URL: [email or web address] Date: 10/06/2023 Prepared by: Joellyn Rued  Exercises - Isometric Shoulder Abduction at Wall  - 1-3 x daily - 7 x weekly - 2 sets - 10 reps - 5 hold - Standing Isometric Shoulder  External Rotation with Doorway (Mirrored)  - 1 x daily - 7 x weekly - 2 sets - 10 reps -  5 hold - Isometric Shoulder Flexion at Wall (Mirrored)  - 1-3 x daily - 7 x weekly - 2 sets - 10 reps - 5 hold - Standing Scapular Retraction  - 1-3 x daily - 7 x weekly - 2 sets - 10 reps - 5 hold - Supine Shoulder Flexion Extension AAROM with Dowel  - 1-3 x daily - 7 x weekly - 2 sets - 10 reps - 5 hold - Seated Shoulder Flexion Towel Slide at Table Top Full Range of Motion  - 1-3 x daily - 7 x weekly - 2 sets - 10 reps - Seated Shoulder Scaption Slide at Table Top with Forearm in Neutral  - 1-3 x daily - 7 x weekly - 2 sets - 10 reps  ASSESSMENT:  CLINICAL IMPRESSION: Pt reports he has been working his motorcycle. Pt advised against working on hs motorcycle until released by hs MD to do so. No overt issues were observed with pt completing too aggressive R shoulder activities involved c motorcycle repair. Today scapular strengthening was progressed maintaining shoulder ext to a neutral position. Pt tolerated PT today without adverse effect. Pt will continue to benefit from skilled PT to address impairments for improved R shoulder function.   EVAL: Patient is a 63 y.o. male who was seen today for physical therapy evaluation and treatment for Reverse TSR on 08/31/23.  Overall patient is doing quite well despite reported moderate to severe pain on a constant basis.  Patient will benefit from skilled physical therapy to work on progressive improvements in range of motion strength and overall mobility.  OBJECTIVE IMPAIRMENTS: decreased ROM, decreased strength, hypomobility, increased fascial restrictions, impaired UE functional use, postural dysfunction, and pain.   ACTIVITY LIMITATIONS: carrying, lifting, sleeping, reach over head, hygiene/grooming, and locomotion level  PARTICIPATION LIMITATIONS: cleaning, driving, community activity, and recreation   PERSONAL FACTORS: 3+ comorbidities: Rt elbow stiffness,  lumbar surgeries, chronic pain   are also affecting patient's functional outcome.   REHAB POTENTIAL: Excellent  CLINICAL DECISION MAKING: Stable/uncomplicated  EVALUATION COMPLEXITY: Low   GOALS: Goals reviewed with patient? Yes  SHORT TERM GOALS: Target date: 10/19/2023    Patient will be independent with initial home exercise program for active range of motion and strength Baseline: Given on evaluation Goal status: INITIAL  2.  Patient will be able to complete ADLs without increased pain including bathing dressing and hygiene Baseline: Pain is moderate to severe Goal status: INITIAL  3.  Patient will require min to no cueing to reduce compensation and shoulder height with shoulder flexion and abduction Baseline:  Goal status: INITIAL  LONG TERM GOALS: Target date: 11/16/2023    Patient will be independent with home exercise program upon discharge Baseline:  Goal status: INITIAL  2.  Patient will demonstrate 4/5 shoulder strength in order to resume recreational activities Baseline:  Goal status: INITIAL  3.  Patient will demonstrate full active range of motion in all planes with pain less than a 4 out of 10 Baseline:  Goal status: INITIAL  4.  Patient will be able to return to painting and mechanical work on his motorcycle without increasing pain Baseline:  Goal status: INITIAL  5.  QuickDASH score will improve by 10 point as a proxy for improved shoulder function Baseline: 39/55 Goal status: INITIAL   PLAN:  PT FREQUENCY: 2x/week  PT DURATION: 8 weeks  PLANNED INTERVENTIONS: 97164- PT Re-evaluation, 97110-Therapeutic exercises, 97530- Therapeutic activity, 97112- Neuromuscular re-education, 97535- Self Care, 56213- Manual therapy, 97016- Vasopneumatic device, Patient/Family  education, Joint mobilization, Cryotherapy, and Moist heat  PLAN FOR NEXT SESSION: Patient is allowed to begin deltoid isometrics follow protocol increasing shoulder passive range of  motion and progressing into active range of motion modalities as needed for pain, manual    [employer] MS, PT 10/10/23 11:49 AM

## 2023-10-10 NOTE — Telephone Encounter (Signed)
 Sent in at reduced frequency

## 2023-10-13 ENCOUNTER — Encounter: Payer: Self-pay | Admitting: Orthopedic Surgery

## 2023-10-13 ENCOUNTER — Other Ambulatory Visit (HOSPITAL_COMMUNITY): Payer: Self-pay | Admitting: Student in an Organized Health Care Education/Training Program

## 2023-10-13 ENCOUNTER — Ambulatory Visit: Payer: MEDICAID | Admitting: Orthopedic Surgery

## 2023-10-13 DIAGNOSIS — F411 Generalized anxiety disorder: Secondary | ICD-10-CM

## 2023-10-13 DIAGNOSIS — Z96611 Presence of right artificial shoulder joint: Secondary | ICD-10-CM

## 2023-10-13 DIAGNOSIS — F4310 Post-traumatic stress disorder, unspecified: Secondary | ICD-10-CM

## 2023-10-13 DIAGNOSIS — F250 Schizoaffective disorder, bipolar type: Secondary | ICD-10-CM

## 2023-10-13 MED ORDER — HYDROXYZINE HCL 25 MG PO TABS: 25.0000 mg | ORAL_TABLET | Freq: Two times a day (BID) | ORAL | 1 refills | Status: DC | PRN

## 2023-10-13 MED ORDER — QUETIAPINE FUMARATE 200 MG PO TABS: 200.0000 mg | ORAL_TABLET | Freq: Every day | ORAL | 1 refills | Status: DC

## 2023-10-13 MED ORDER — TRAZODONE HCL 100 MG PO TABS: 100.0000 mg | ORAL_TABLET | Freq: Every day | ORAL | 1 refills | Status: DC

## 2023-10-13 NOTE — Progress Notes (Signed)
 Post-Op Visit Note   Patient: Keith Gibbs           Date of Birth: January 28, 1961           MRN: [id number] Visit Date: 10/13/2023 PCP: Rema Fendt, NP   Assessment & Plan:  Chief Complaint:  Chief Complaint  Patient presents with   Right Shoulder - Routine Post Op    right reverse shoulder arthroplasty on 08/31/2023.   Visit Diagnoses: No diagnosis found.  Plan: Patient is now 6 weeks out right reverse shoulder replacement.  Taking about 2 Percocet a day.  Physical therapy working on range of motion.  On examination range of motion is 25/90/120.  Pretty good subscap and external rotation strength.  At this time it is okay for him to add strengthening to his range of motion protocol and therapy.  Also continue to work on home exercise program at home particularly for stretching.  6-week return for final check.  Follow-Up Instructions: No follow-ups on file.   Orders:  No orders of the defined types were placed in this encounter.  No orders of the defined types were placed in this encounter.   Imaging: No results found.  PMFS History: Patient Active Problem List   Diagnosis Date Noted   Shoulder arthritis 08/31/2023   Lumbar radiculopathy 05/30/2023   Major depressive disorder, recurrent severe without psychotic features (HCC) 01/28/2023   S/P lumbar fusion 08/23/2022   Postlaminectomy syndrome, not elsewhere classified 08/12/2022   MDD (major depressive disorder) 08/10/2022   Substance induced mood disorder (HCC) 08/06/2022   Suicide attempt by drug overdose (HCC) 08/06/2022   Toxic encephalopathy 08/03/2022   Suicide ideation 08/03/2022   Encephalopathy 08/03/2022   Polysubstance abuse (HCC) 04/28/2022   Postoperative abdominal pain 02/28/2022   Acute cholecystitis 02/17/2022   Obstruction of nasal valve 01/07/2022   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   Pulmonary nodule 12/17/2021   CAD S/P percutaneous coronary angioplasty 12/17/2021   Epigastric abdominal pain     Nausea and vomiting    Tobacco dependence due to cigarettes 08/31/2021   Iron deficiency anemia secondary to inadequate dietary iron intake 08/31/2021   Acute lead-induced gout involving toe of left foot 08/31/2021   Encounter for lipid screening for cardiovascular disease 08/31/2021   Hypotension due to drugs 06/21/2021   Opioid withdrawal (HCC) 06/21/2021   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HCC) 06/21/2021   RLS (restless legs syndrome) 05/07/2021   Acquired deviated nasal septum 05/03/2021   Encounter for surveillance of abnormal nevi 05/03/2021   Flu vaccine need 05/03/2021   Substance abuse (HCC) 05/03/2021   Drug abuse and dependence (HCC) 05/03/2021   Need for shingles vaccine 05/03/2021   Right groin pain 01/29/2021   S/P TKR (total knee replacement) using cement, right 01/19/2021   Cigarette smoker 07/21/2020   Foot pain, bilateral 12/25/2019   Potential exposure to STD 12/12/2019   History of lumbar surgery 12/12/2019   Fatigue 12/12/2019   Penile lesion 12/12/2019   Right testicular pain 12/12/2019   Body mass index 28.0-28.9, adult 12/12/2019   History of COPD 12/12/2019   Tobacco use disorder, continuous 12/12/2019   Hepatoma (HCC) 11/05/2019   Anxiety 12/10/2018   Essential hypertension 09/18/2018   Hypercholesteremia 09/18/2018   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   Chronic anticoagulation (Plavix) 04/19/2018   Pain medication agreement broken ([facility]) 04/19/2018   Cocaine use 04/18/2018   Cocaine abuse (HCC) (See 04/12/18 UDS) 04/18/2018   Marijuana abuse (See 04/12/18 UDS) 04/18/2018   Long term current  use of opiate analgesic 04/12/2018   Cirrhosis of liver (HCC) 04/03/2018   BPH (benign prostatic hyperplasia) 04/03/2018   Hematuria, microscopic 04/03/2018   Osteoarthritis of the knee (Left) 11/02/2017   Lumbar facet syndrome (Bilateral) 11/01/2017   Spondylosis without myelopathy or radiculopathy, lumbosacral region 11/01/2017   Osteoarthritis of shoulder (Right)  11/01/2017   Osteoarthritis of acromioclavicular joint (Right) 11/01/2017   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) 11/01/2017   Osteoarthritis 11/01/2017   Cervicalgia 11/01/2017   Cervical facet syndrome 11/01/2017   DDD (degenerative disc disease), cervical 11/01/2017   DDD (degenerative disc disease), lumbar 11/01/2017   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) 09/18/2017   Other chronic pain 09/18/2017   Chronic musculoskeletal pain 09/18/2017   Chronic pain of right knee 09/11/2017   Chronic low back pain (Secondary Area of Pain) (Bilateral) 09/11/2017   Chronic hip pain (Tertiary Area of Pain) (Bilateral) (R>L) 09/11/2017   Chronic shoulder pain (Fourth Area of Pain) (Right) 09/11/2017   Spondylosis without myelopathy or radiculopathy, cervical region 09/11/2017   Chronic pain syndrome 09/11/2017   Opiate use 09/11/2017   Screen for STD (sexually transmitted disease) 09/11/2017   Disorder of skeletal system 09/11/2017   Problems influencing health status 09/11/2017   Overdose of medication, intentional self-harm, sequela (HCC) 12/28/2016   Severe recurrent major depression without psychotic features (HCC) 12/27/2016   Alcohol use disorder, moderate, dependence (HCC) 12/27/2016   Cannabis use disorder, moderate, dependence (HCC) 12/27/2016   Chest pain 06/10/2016   Bipolar 1 disorder (HCC) 05/27/2013   Myalgia 05/27/2013   Abnormal ejaculation 11/30/2012   Chest pain, unspecified 11/30/2012   Degenerative arthritis of hip 11/16/2012   Schizoaffective disorder (HCC) 06/27/2012   COPD (chronic obstructive pulmonary disease) (HCC) 06/27/2012   Chronic hepatitis C (HCC) 06/27/2012   GERD (gastroesophageal reflux disease) 06/27/2012   Complete rupture of rotator cuff 02/10/2012   Cocaine abuse in remission (HCC) 12/05/2011   Unspecified osteoarthritis, unspecified site 07/27/2011   Past Medical History:  Diagnosis Date   Anginal pain (HCC)    Anxiety     Arthritis    Bipolar 1 disorder (HCC)    Bursitis    CAD (coronary artery disease)    Chronic pain    COPD (chronic obstructive pulmonary disease) (HCC)    Current use of long term anticoagulation    DAPT (ASA + clopidogrel)   Depression    Diverticulitis    Dyspnea    GERD (gastroesophageal reflux disease)    Grade I diastolic dysfunction    Hepatitis C 2012   No longer has Hep C   HLD (hyperlipidemia)    Hypertension    MI (myocardial infarction) (HCC)    Polysubstance abuse (HCC)    cocaine, marijuana, ETOH   PUD (peptic ulcer disease)    S/P angioplasty with stent 06/10/2016   a.) 90% stenosis of pLAD to mLAD - 2.5 x 18 mm Xience Alpine (DES x 1) placed to pLAD   S/P PTCA (percutaneous transluminal coronary angioplasty) 12/04/2019   a.) 60% in stent restenosis of DES to pLAD; LVEF 65%.   Schizophrenia (HCC)    Stroke [facility])    Valvular insufficiency    a.) Mild MR, TR, PR; mild to moderate AR on 03/05/2018 TTE    Family History  Problem Relation Age of Onset   Osteoarthritis Mother    Heart disease Mother    Hypertension Mother    Depression Mother    Heart disease Father    Early  death Father    Hypertension Father    Heart attack Father    Hypertension Sister    Parkinson's disease Maternal Grandfather    Prostate cancer Neg Hx    Bladder Cancer Neg Hx    Kidney cancer Neg Hx    Tremor Neg Hx     Past Surgical History:  Procedure Laterality Date   ABDOMINAL SURGERY     removed small piece of intestines due to [facility] Diverticulosis   ANTERIOR LAT LUMBAR FUSION N/A 08/23/2022   Procedure: DIRECT LATERAL INTERBODY FUSION  LUMBAR TWO- LUMBAR THREE, LUMBAR THREE-LUMBAR FOUR , EXPLORE AND EXTEND FUSION LUMBAR TWO-LUMBAR FIVE, POSTERIOR DECOMPRESSION LUMBAR THREE-LUMBAR FOUR, LEFT LUMBAR TWO-LUMBAR THREE;  Surgeon: Bedelia Person, MD;  Location: [facility] OR;  Service: Neurosurgery;  Laterality: N/A;   APPENDECTOMY     BACK SURGERY     CARDIAC CATHETERIZATION Left  06/10/2016   Procedure: Left Heart Cath and Coronary Angiography;  Surgeon: Laurier Nancy, MD;  Location: [facility] INVASIVE CV LAB;  Service: Cardiovascular;  Laterality: Left;   CARDIAC CATHETERIZATION N/A 06/10/2016   Procedure: Coronary Stent Intervention;  Surgeon: Alwyn Pea, MD;  Location: [facility] INVASIVE CV LAB;  Service: Cardiovascular;  Laterality: N/A;   CHOLECYSTECTOMY N/A 02/17/2022   Procedure: LAPAROSCOPIC CHOLECYSTECTOMY;  Surgeon: Sheliah Hatch De Blanch, MD;  Location: [facility] OR;  Service: General;  Laterality: N/A;   COLON SURGERY     COLONOSCOPY     COLONOSCOPY WITH PROPOFOL N/A 01/05/2017   Procedure: COLONOSCOPY WITH PROPOFOL;  Surgeon: Wyline Mood, MD;  Location: [facility] ENDOSCOPY;  Service: Endoscopy;  Laterality: N/A;   COLONOSCOPY WITH PROPOFOL N/A 02/13/2020   Procedure: COLONOSCOPY WITH PROPOFOL;  Surgeon: Pasty Spillers, MD;  Location: [facility] ENDOSCOPY;  Service: Endoscopy;  Laterality: N/A;   CORONARY ANGIOPLASTY WITH STENT PLACEMENT     CORONARY PRESSURE/FFR STUDY N/A 12/04/2019   Procedure: INTRAVASCULAR PRESSURE WIRE/FFR STUDY;  Surgeon: Tonny Bollman, MD;  Location: [facility] INVASIVE CV LAB;  Service: Cardiovascular;  Laterality: N/A;   ESOPHAGOGASTRODUODENOSCOPY (EGD) WITH PROPOFOL N/A 01/05/2017   Procedure: ESOPHAGOGASTRODUODENOSCOPY (EGD) WITH PROPOFOL;  Surgeon: Wyline Mood, MD;  Location: [facility] ENDOSCOPY;  Service: Endoscopy;  Laterality: N/A;   ESOPHAGOGASTRODUODENOSCOPY (EGD) WITH PROPOFOL N/A 02/13/2020   Procedure: ESOPHAGOGASTRODUODENOSCOPY (EGD) WITH PROPOFOL;  Surgeon: Pasty Spillers, MD;  Location: [facility] ENDOSCOPY;  Service: Endoscopy;  Laterality: N/A;   KNEE ARTHROSCOPY WITH MEDIAL MENISECTOMY Right 09/05/2017   Procedure: KNEE ARTHROSCOPY WITH MEDIAL AND LATERAL  MENISECTOMY PARTIAL SYNOVECTOMY;  Surgeon: Kennedy Bucker, MD;  Location: [facility] ORS;  Service: Orthopedics;  Laterality: Right;   LEFT HEART CATH AND CORONARY ANGIOGRAPHY N/A 12/04/2019    Procedure: LEFT HEART CATH AND CORONARY ANGIOGRAPHY;  Surgeon: Tonny Bollman, MD;  Location: [facility] INVASIVE CV LAB;  Service: Cardiovascular;  Laterality: N/A;   LEFT HEART CATH AND CORONARY ANGIOGRAPHY Left 11/25/2022   Procedure: LEFT HEART CATH AND CORONARY ANGIOGRAPHY;  Surgeon: Laurier Nancy, MD;  Location: [facility] INVASIVE CV LAB;  Service: Cardiovascular;  Laterality: Left;   REVERSE SHOULDER ARTHROPLASTY Right 08/31/2023   Procedure: RIGHT REVERSE SHOULDER ARTHROPLASTY;  Surgeon: Cammy Copa, MD;  Location: [facility] OR;  Service: Orthopedics;  Laterality: Right;   SHOULDER SURGERY Right 04/09/2012   SPINE SURGERY     TOTAL KNEE ARTHROPLASTY Right 01/19/2021   Procedure: TOTAL KNEE ARTHROPLASTY - [place] Neighbor to Assist;  Surgeon: Kennedy Bucker, MD;  Location: [facility] ORS;  Service: Orthopedics;  Laterality: Right;   Social History   Occupational History  Occupation: disability   Occupation: stocking at gas station    Comment: 2 days per week  Tobacco Use   Smoking status: Some Days    Current packs/day: 0.00    Average packs/day: 0.5 packs/day for 32.0 years (16.0 ttl pk-yrs)    Types: Cigarettes    Start date: 10/25/1989    Last attempt to quit: 10/25/2021    Years since quitting: 1.9    Passive exposure: Current   Smokeless tobacco: Never   Tobacco comments:     Quit 06/2023  Vaping Use   Vaping status: Never Used  Substance and Sexual Activity   Alcohol use: Not Currently    Alcohol/week: 3.0 standard drinks of alcohol    Types: 3 Cans of beer per week    Comment: occassionally    Drug use: Not Currently    Types: Cocaine, Marijuana    Comment: last on saturday   Sexual activity: Not Currently    Partners: Female    Birth control/protection: None

## 2023-10-13 NOTE — Telephone Encounter (Signed)
 Done. Thanks.

## 2023-10-17 ENCOUNTER — Ambulatory Visit: Payer: MEDICAID | Admitting: Physical Therapy

## 2023-10-17 ENCOUNTER — Encounter: Payer: Self-pay | Admitting: Physical Therapy

## 2023-10-17 DIAGNOSIS — Z96611 Presence of right artificial shoulder joint: Secondary | ICD-10-CM

## 2023-10-17 DIAGNOSIS — M6281 Muscle weakness (generalized): Secondary | ICD-10-CM

## 2023-10-17 DIAGNOSIS — M25611 Stiffness of right shoulder, not elsewhere classified: Secondary | ICD-10-CM

## 2023-10-17 NOTE — Therapy (Addendum)
 OUTPATIENT PHYSICAL THERAPY SHOULDER TREATMENT DISCHARGE   Patient Name: Keith Gibbs MRN: [id number] DOB:[birth year], 63 y.o., male Today's Date: 10/17/2023   END OF SESSION:  PT End of Session - 10/17/23 0933     Visit Number 4    Number of Visits 17    Date for PT Re-Evaluation 11/16/23    Authorization Type [employer]    Authorization Time Period Approved 12 visits 09/25/23-11/11/23 [id number]    Authorization - Visit Number 3    Authorization - Number of Visits 12    PT Start Time 0930    PT Stop Time 1012    PT Time Calculation (min) 42 min               Past Medical History:  Diagnosis Date   Anginal pain (HCC)    Anxiety    Arthritis    Bipolar 1 disorder (HCC)    Bursitis    CAD (coronary artery disease)    Chronic pain    COPD (chronic obstructive pulmonary disease) (HCC)    Current use of long term anticoagulation    DAPT (ASA + clopidogrel )   Depression    Diverticulitis    Dyspnea    GERD (gastroesophageal reflux disease)    Grade I diastolic dysfunction    Hepatitis C 2012   No longer has Hep C   HLD (hyperlipidemia)    Hypertension    MI (myocardial infarction) (HCC)    Polysubstance abuse (HCC)    cocaine, marijuana, ETOH   PUD (peptic ulcer disease)    S/P angioplasty with stent 06/10/2016   a.) 90% stenosis of pLAD to mLAD - 2.5 x 18 mm Xience Alpine (DES x 1) placed to pLAD   S/P PTCA (percutaneous transluminal coronary angioplasty) 12/04/2019   a.) 60% in stent restenosis of DES to pLAD; LVEF 65%.   Schizophrenia (HCC)    Stroke [facility])    Valvular insufficiency    a.) Mild MR, TR, PR; mild to moderate AR on 03/05/2018 TTE   Past Surgical History:  Procedure Laterality Date   ABDOMINAL SURGERY     removed small piece of intestines due to [facility] Diverticulosis   ANTERIOR LAT LUMBAR FUSION N/A 08/23/2022   Procedure: DIRECT LATERAL INTERBODY FUSION  LUMBAR TWO- LUMBAR THREE, LUMBAR THREE-LUMBAR FOUR , EXPLORE AND  EXTEND FUSION LUMBAR TWO-LUMBAR FIVE, POSTERIOR DECOMPRESSION LUMBAR THREE-LUMBAR FOUR, LEFT LUMBAR TWO-LUMBAR THREE;  Surgeon: Van Gelinas, MD;  Location: [facility] OR;  Service: Neurosurgery;  Laterality: N/A;   APPENDECTOMY     BACK SURGERY     CARDIAC CATHETERIZATION Left 06/10/2016   Procedure: Left Heart Cath and Coronary Angiography;  Surgeon: Cherrie Cornwall, MD;  Location: [facility] INVASIVE CV LAB;  Service: Cardiovascular;  Laterality: Left;   CARDIAC CATHETERIZATION N/A 06/10/2016   Procedure: Coronary Stent Intervention;  Surgeon: Antonette Batters, MD;  Location: [facility] INVASIVE CV LAB;  Service: Cardiovascular;  Laterality: N/A;   CHOLECYSTECTOMY N/A 02/17/2022   Procedure: LAPAROSCOPIC CHOLECYSTECTOMY;  Surgeon: Dorrie Gaudier Alphonso Aschoff, MD;  Location: [facility] OR;  Service: General;  Laterality: N/A;   COLON SURGERY     COLONOSCOPY     COLONOSCOPY WITH PROPOFOL  N/A 01/05/2017   Procedure: COLONOSCOPY WITH PROPOFOL ;  Surgeon: Luke Salaam, MD;  Location: [facility] ENDOSCOPY;  Service: Endoscopy;  Laterality: N/A;   COLONOSCOPY WITH PROPOFOL  N/A 02/13/2020   Procedure: COLONOSCOPY WITH PROPOFOL ;  Surgeon: Irby Mannan, MD;  Location: [facility] ENDOSCOPY;  Service: Endoscopy;  Laterality: N/A;  CORONARY ANGIOPLASTY WITH STENT PLACEMENT     CORONARY PRESSURE/FFR STUDY N/A 12/04/2019   Procedure: INTRAVASCULAR PRESSURE WIRE/FFR STUDY;  Surgeon: Arnoldo Lapping, MD;  Location: [facility] INVASIVE CV LAB;  Service: Cardiovascular;  Laterality: N/A;   ESOPHAGOGASTRODUODENOSCOPY (EGD) WITH PROPOFOL  N/A 01/05/2017   Procedure: ESOPHAGOGASTRODUODENOSCOPY (EGD) WITH PROPOFOL ;  Surgeon: Luke Salaam, MD;  Location: [facility] ENDOSCOPY;  Service: Endoscopy;  Laterality: N/A;   ESOPHAGOGASTRODUODENOSCOPY (EGD) WITH PROPOFOL  N/A 02/13/2020   Procedure: ESOPHAGOGASTRODUODENOSCOPY (EGD) WITH PROPOFOL ;  Surgeon: Irby Mannan, MD;  Location: [facility] ENDOSCOPY;  Service: Endoscopy;  Laterality: N/A;   KNEE ARTHROSCOPY WITH  MEDIAL MENISECTOMY Right 09/05/2017   Procedure: KNEE ARTHROSCOPY WITH MEDIAL AND LATERAL  MENISECTOMY PARTIAL SYNOVECTOMY;  Surgeon: Molli Angelucci, MD;  Location: [facility] ORS;  Service: Orthopedics;  Laterality: Right;   LEFT HEART CATH AND CORONARY ANGIOGRAPHY N/A 12/04/2019   Procedure: LEFT HEART CATH AND CORONARY ANGIOGRAPHY;  Surgeon: Arnoldo Lapping, MD;  [facility] INVASIVE CV LAB;  Service: Cardiovascular;  Laterality: N/A;   LEFT HEART CATH AND CORONARY ANGIOGRAPHY Left 11/25/2022   Procedure: LEFT HEART CATH AND CORONARY ANGIOGRAPHY;  Surgeon: Cherrie Cornwall, MD;  Location: [facility] INVASIVE CV LAB;  Service: Cardiovascular;  Laterality: Left;   REVERSE SHOULDER ARTHROPLASTY Right 08/31/2023   Procedure: RIGHT REVERSE SHOULDER ARTHROPLASTY;  Surgeon: Jasmine Mesi, MD;  Location: [facility] OR;  Service: Orthopedics;  Laterality: Right;   SHOULDER SURGERY Right 04/09/2012   SPINE SURGERY     TOTAL KNEE ARTHROPLASTY Right 01/19/2021   Procedure: TOTAL KNEE ARTHROPLASTY - Thomos Flies to Assist;  Surgeon: Molli Angelucci, MD;  Location: [facility] ORS;  Service: Orthopedics;  Laterality: Right;   Patient Active Problem List   Diagnosis Date Noted   Shoulder arthritis 08/31/2023   Lumbar radiculopathy 05/30/2023   Major depressive disorder, recurrent severe without psychotic features (HCC) 01/28/2023   S/P lumbar fusion 08/23/2022   Postlaminectomy syndrome, not elsewhere classified 08/12/2022   MDD (major depressive disorder) 08/10/2022   Substance induced mood disorder (HCC) 08/06/2022   Suicide attempt by drug overdose (HCC) 08/06/2022   Toxic encephalopathy 08/03/2022   Suicide ideation 08/03/2022   Encephalopathy 08/03/2022   Polysubstance abuse (HCC) 04/28/2022   Postoperative abdominal pain 02/28/2022   Acute cholecystitis 02/17/2022   Obstruction of nasal valve 01/07/2022   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   Pulmonary nodule 12/17/2021   CAD S/P percutaneous coronary angioplasty 12/17/2021    Epigastric abdominal pain    Nausea and vomiting    Tobacco dependence due to cigarettes 08/31/2021   Iron deficiency anemia secondary to inadequate dietary iron intake 08/31/2021   Acute lead-induced gout involving toe of left foot 08/31/2021   Encounter for lipid screening for cardiovascular disease 08/31/2021   Hypotension due to drugs 06/21/2021   Opioid withdrawal (HCC) 06/21/2021   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HCC) 06/21/2021   RLS (restless legs syndrome) 05/07/2021   Acquired deviated nasal septum 05/03/2021   Encounter for surveillance of abnormal nevi 05/03/2021   Flu vaccine need 05/03/2021   Substance abuse (HCC) 05/03/2021   Drug abuse and dependence (HCC) 05/03/2021   Need for shingles vaccine 05/03/2021   Right groin pain 01/29/2021   S/P TKR (total knee replacement) using cement, right 01/19/2021   Cigarette smoker 07/21/2020   Foot pain, bilateral 12/25/2019   Potential exposure to STD 12/12/2019   History of lumbar surgery 12/12/2019   Fatigue 12/12/2019   Penile lesion 12/12/2019   Right testicular pain 12/12/2019   Body mass index 28.0-28.9, adult 12/12/2019  History of COPD 12/12/2019   Tobacco use disorder, continuous 12/12/2019   Hepatoma (HCC) 11/05/2019   Anxiety 12/10/2018   Essential hypertension 09/18/2018   Hypercholesteremia 09/18/2018   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   Chronic anticoagulation (Plavix ) 04/19/2018   Pain medication agreement broken ([facility]) 04/19/2018   Cocaine use 04/18/2018   Cocaine abuse (HCC) (See 04/12/18 UDS) 04/18/2018   Marijuana abuse (See 04/12/18 UDS) 04/18/2018   Long term current use of opiate analgesic 04/12/2018   Cirrhosis of liver (HCC) 04/03/2018   BPH (benign prostatic hyperplasia) 04/03/2018   Hematuria, microscopic 04/03/2018   Osteoarthritis of the knee (Left) 11/02/2017   Lumbar facet syndrome (Bilateral) 11/01/2017   Spondylosis without myelopathy or radiculopathy, lumbosacral region 11/01/2017    Osteoarthritis of shoulder (Right) 11/01/2017   Osteoarthritis of acromioclavicular joint (Right) 11/01/2017   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) 11/01/2017   Osteoarthritis 11/01/2017   Cervicalgia 11/01/2017   Cervical facet syndrome 11/01/2017   DDD (degenerative disc disease), cervical 11/01/2017   DDD (degenerative disc disease), lumbar 11/01/2017   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) 09/18/2017   Other chronic pain 09/18/2017   Chronic musculoskeletal pain 09/18/2017   Chronic pain of right knee 09/11/2017   Chronic low back pain (Secondary Area of Pain) (Bilateral) 09/11/2017   Chronic hip pain (Tertiary Area of Pain) (Bilateral) (R>L) 09/11/2017   Chronic shoulder pain (Fourth Area of Pain) (Right) 09/11/2017   Spondylosis without myelopathy or radiculopathy, cervical region 09/11/2017   Chronic pain syndrome 09/11/2017   Opiate use 09/11/2017   Screen for STD (sexually transmitted disease) 09/11/2017   Disorder of skeletal system 09/11/2017   Problems influencing health status 09/11/2017   Overdose of medication, intentional self-harm, sequela (HCC) 12/28/2016   Severe recurrent major depression without psychotic features (HCC) 12/27/2016   Alcohol  use disorder, moderate, dependence (HCC) 12/27/2016   Cannabis use disorder, moderate, dependence (HCC) 12/27/2016   Chest pain 06/10/2016   Bipolar 1 disorder (HCC) 05/27/2013   Myalgia 05/27/2013   Abnormal ejaculation 11/30/2012   Chest pain, unspecified 11/30/2012   Degenerative arthritis of hip 11/16/2012   Schizoaffective disorder (HCC) 06/27/2012   COPD (chronic obstructive pulmonary disease) (HCC) 06/27/2012   Chronic hepatitis C (HCC) 06/27/2012   GERD (gastroesophageal reflux disease) 06/27/2012   Complete rupture of rotator cuff 02/10/2012   Cocaine abuse in remission (HCC) 12/05/2011   Unspecified osteoarthritis, unspecified site 07/27/2011    PCP: Senaida Dama NP   REFERRING PROVIDER:  Casilda Clayman, PA-C  REFERRING DIAG: M25.511 (ICD-10-CM) - Acute pain of right shoulder Z96.611 (ICD-10-CM) - History of arthroplasty of right shoulder  THERAPY DIAG:  No diagnosis found.  Rationale for Evaluation and Treatment: Rehabilitation  ONSET DATE: 08/31/2023   SUBJECTIVE:  SUBJECTIVE STATEMENT: Saw the doctor. He was pleased. A little pain. 5/10 in the shoulder, MD said I could work on my motorcycle, just not to over do it.   EVAL: Pt underwent Rt RTSR on 08/31/23.  Patient recently saw the doctor who was pleased with his progress so far.  Pt reports frustration with his physical limitations. He is not working right now, disabled.  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  Patient reports his pain limits his ability to sleep, work on his motorcycle and other home tasks.  He recently discontinued the use of the sling but does still use pillows to help get comfortable.  He does report couple of days ago he tripped while walking up the stairs and had to catch himself on both hands.  Hand dominance: Right  PERTINENT HISTORY: Patient has had multiple surgeries including lumbar fusion, knee arthroscopy, osteoarthritis COPD and cardiac stent. Substance abuse.   PAIN:  Are you having pain? Yes: NPRS scale: 5/10 Pain location: Rt ant shoulder  Pain description: sore and achy Aggravating factors: constant , worse with using it  Relieving factors: rest, meds, ice   PRECAUTIONS: Other: protocol: no ER past 30 deg  RED FLAGS: None   WEIGHT BEARING RESTRICTIONS: No  FALLS:  Has patient fallen in last 6 months? Yes, 1 time yesterday, walking up the stairs  LIVING ENVIRONMENT: Lives with: lives with their familymoved back in with his mom after being widowed  Lives in: House/apartment Stairs: Yes: External: no issues   steps; NA  Has following equipment at home: None  OCCUPATION: Disabled  PLOF: Independent with basic ADLs, Vocation/Vocational requirements: NA, and Leisure: likes to use his hands , takes his motorcycle out and paint   PATIENT GOALS:I would like to ride my motorcycle again.   NEXT MD VISIT:   OBJECTIVE:  Note: Objective measures were completed at Evaluation unless otherwise noted.  DIAGNOSTIC FINDINGS:  None   PATIENT SURVEYS:  Quick Dash 40/55  COGNITION: Overall cognitive status: Within functional limits for tasks assessed     SENSATION: WFL  POSTURE: Rounded shoulders, elbow flexed   UPPER EXTREMITY ROM:   Active ROM Right eval Left eval Rt 10/06/23 RT 10/10/23  Shoulder flexion 110 160 AA 130 AA140  Shoulder extension      Shoulder abduction 90 Full     Shoulder adduction      Shoulder internal rotation PROM 50  Full     Shoulder external rotation PROM 40  Full     Elbow flexion Full      Elbow extension -30     Wrist flexion      Wrist extension      Wrist ulnar deviation      Wrist radial deviation      Wrist pronation      Wrist supination      (Blank rows = not tested)  UPPER EXTREMITY MMT:Nt on Rt GH joint, appears 3-/5   MMT Right eval Left Eval WNL   Shoulder flexion    Shoulder extension    Shoulder abduction    Shoulder adduction    Shoulder internal rotation    Shoulder external rotation    Middle trapezius    Lower trapezius    Elbow flexion 4   Elbow extension 4-   Wrist flexion    Wrist extension    Wrist ulnar deviation    Wrist radial deviation    Wrist pronation    Wrist supination  Grip strength (lbs)    (Blank rows = not tested)  PALPATION:  Steristrips in place, pain along anterior aspect of shoulder , some laterally and along clavicle                                                                                                                              TREATMENT DATE:  [facility] Adult PT Treatment:                                                 DATE: 10/17/23 Therapeutic Exercise: Standing shoulder rows Scapular Retraction 2x10 reps - 3hold RTB, to neutral Standing shoulder rows Scapular Ext 2x10 reps - 3hold RTB, to neutral Isometric Shoulder Abduction, flexion, and ER 10 reps - 5 hold  PROM right shoulder Flexion, ER    Therapeutic Activity: UBE level 1 alternating forward and reverse 1 minute each x 4 total minutes  Shoulder pulleys for AAROM OH x 1 minute Supine short lever flexion AROM x 10 Supine long lever flexion AROM x 5     [facility] Adult PT Treatment:             6 wks s/p Sx                                   DATE: 10/10/23 Therapeutic Exercise: Seated flexion on table x10, x5 10" Seated scaption on table x10, x5 10" Isometric Shoulder Abduction, flexion, and ER 10 reps - 5 hold 50% effort Standing shoulder rows Scapular Retraction 2x10 reps - 3hold RTB, to neutral Standing shoulder rows Scapular Ext 2x10 reps - 3hold RTB, to neutral Supine Shoulder Flexion AAROM with Dowel 10 reps - 5 hold Supine Shoulder ER AAROM with Dowel 10 reps - 5 hold, in 30d of abd Manual Therapy: PROM for shoulder flex, abd , ER at 30d abd Self Care: Pt advised against working on hs motorcycle until released by hs MD to do so  [facility] Adult PT Treatment:           5 weeks s/p Sx                                     DATE: 10/04/23 Therapeutic Exercise: Seated flexion on table x10  Seated scaption on table x10 Isometric Shoulder Abduction, flexion, and ER 10 reps - 5 hold 50% effort Standing Scapular Retraction 10 reps - 5 hold Supine Shoulder Flexion Extension AAROM with Dowel 10 reps - 5 hold HEP updated Manual Therapy: PROM for shoulder flex, abd , ER at 30d abd  [facility] Adult PT Treatment:  DATE: 09/21/23  Self Care: HEP, protocol, precautions  - no charge    PATIENT EDUCATION: Education details: Precautions, HEP  and AAROM, POC  Person educated:  Patient Education method: Explanation, Demonstration, Verbal cues, and Handouts Education comprehension: verbalized understanding, returned demonstration, and needs further education  HOME EXERCISE PROGRAM: Access Code: [email or web address] URL: [email or web address] Date: 10/06/2023 Prepared by: Liborio Reeds  Exercises - Isometric Shoulder Abduction at Wall  - 1-3 x daily - 7 x weekly - 2 sets - 10 reps - 5 hold - Standing Isometric Shoulder External Rotation with Doorway (Mirrored)  - 1 x daily - 7 x weekly - 2 sets - 10 reps - 5 hold - Isometric Shoulder Flexion at Wall (Mirrored)  - 1-3 x daily - 7 x weekly - 2 sets - 10 reps - 5 hold - Standing Scapular Retraction  - 1-3 x daily - 7 x weekly - 2 sets - 10 reps - 5 hold - Supine Shoulder Flexion Extension AAROM with Dowel  - 1-3 x daily - 7 x weekly - 2 sets - 10 reps - 5 hold - Seated Shoulder Flexion Towel Slide at Table Top Full Range of Motion  - 1-3 x daily - 7 x weekly - 2 sets - 10 reps - Seated Shoulder Scaption Slide at Table Top with Forearm in Neutral  - 1-3 x daily - 7 x weekly - 2 sets - 10 reps  ASSESSMENT:  CLINICAL IMPRESSION: Pt saw MD for 6 weeks follow up. MD agreeable to progression to strengthening and continued stretching. MD allowing him to work on motorcycle with caution. Progressed activity tolerance to UBE with good tolerance. Continued with scap strength and shoulder isometrics. Pt did well with all therex. Began Supine flexion AROM. Will assess response to increased demand this session and plan to update HEP as indicated. Pt to schedule more visits today.  Pt will continue to benefit from skilled PT to address impairments for improved R shoulder function.   EVAL: Patient is a 63 y.o. male who was seen today for physical therapy evaluation and treatment for Reverse TSR on 08/31/23.  Overall patient is doing quite well despite reported moderate to severe pain on a constant basis.  Patient will benefit from skilled  physical therapy to work on progressive improvements in range of motion strength and overall mobility.  OBJECTIVE IMPAIRMENTS: decreased ROM, decreased strength, hypomobility, increased fascial restrictions, impaired UE functional use, postural dysfunction, and pain.   ACTIVITY LIMITATIONS: carrying, lifting, sleeping, reach over head, hygiene/grooming, and locomotion level  PARTICIPATION LIMITATIONS: cleaning, driving, community activity, and recreation   PERSONAL FACTORS: 3+ comorbidities: Rt elbow stiffness, lumbar surgeries, chronic pain  are also affecting patient's functional outcome.   REHAB POTENTIAL: Excellent  CLINICAL DECISION MAKING: Stable/uncomplicated  EVALUATION COMPLEXITY: Low   GOALS: Goals reviewed with patient? Yes  SHORT TERM GOALS: Target date: 10/19/2023    Patient will be independent with initial home exercise program for active range of motion and strength Baseline: Given on evaluation Goal status: INITIAL  2.  Patient will be able to complete ADLs without increased pain including bathing dressing and hygiene Baseline: Pain is moderate to severe 10/17/23: no pain with dressing or hygiene  Goal status: MET   3.  Patient will require min to no cueing to reduce compensation and shoulder height with shoulder flexion and abduction Baseline:  Goal status: INITIAL  LONG TERM GOALS: Target date: 11/16/2023    Patient will be independent with home exercise program upon discharge  Baseline:  Goal status: INITIAL  2.  Patient will demonstrate 4/5 shoulder strength in order to resume recreational activities Baseline:  Goal status: INITIAL  3.  Patient will demonstrate full active range of motion in all planes with pain less than a 4 out of 10 Baseline:  Goal status: INITIAL  4.  Patient will be able to return to painting and mechanical work on his motorcycle without increasing pain Baseline:  Goal status: INITIAL  5.  QuickDASH score will improve by 10  point as a proxy for improved shoulder function Baseline: 39/55 Goal status: INITIAL   PLAN:  PT FREQUENCY: 2x/week  PT DURATION: 8 weeks  PLANNED INTERVENTIONS: 97164- PT Re-evaluation, 97110-Therapeutic exercises, 97530- Therapeutic activity, 97112- Neuromuscular re-education, 97535- Self Care, 16109- Manual therapy, 97016- Vasopneumatic device, Patient/Family education, Joint mobilization, Cryotherapy, and Moist heat  PLAN FOR NEXT SESSION: Patient is allowed to begin deltoid isometrics follow protocol increasing shoulder passive range of motion and progressing into active range of motion modalities as needed for pain, manual    Gasper Karst, PTA 10/17/23 10:15 AM Phone: [phone] Fax: [phone]

## 2023-10-19 ENCOUNTER — Ambulatory Visit: Payer: MEDICAID

## 2023-10-20 ENCOUNTER — Telehealth: Payer: Self-pay | Admitting: Orthopedic Surgery

## 2023-10-20 ENCOUNTER — Encounter (HOSPITAL_COMMUNITY): Payer: MEDICAID | Admitting: Student in an Organized Health Care Education/Training Program

## 2023-10-20 ENCOUNTER — Other Ambulatory Visit: Payer: Self-pay | Admitting: Surgical

## 2023-10-20 MED ORDER — OXYCODONE HCL 5 MG PO TABS: 5.0000 mg | ORAL_TABLET | Freq: Two times a day (BID) | ORAL | 0 refills | Status: DC | PRN

## 2023-10-20 NOTE — Telephone Encounter (Signed)
 Called and advised pt.

## 2023-10-20 NOTE — Telephone Encounter (Signed)
 Sent in

## 2023-10-20 NOTE — Telephone Encounter (Signed)
 Pt called requesting a refill of oxycodone. Pt states [facility] [address] also requires  pre auth. Pt phone number is [phone].

## 2023-10-22 ENCOUNTER — Other Ambulatory Visit: Payer: Self-pay | Admitting: Orthopedic Surgery

## 2023-10-25 ENCOUNTER — Ambulatory Visit: Payer: MEDICAID | Attending: Surgical

## 2023-10-27 ENCOUNTER — Ambulatory Visit: Payer: MEDICAID

## 2023-10-31 ENCOUNTER — Ambulatory Visit: Payer: MEDICAID | Admitting: Physical Therapy

## 2023-11-01 ENCOUNTER — Telehealth (HOSPITAL_COMMUNITY): Payer: Self-pay

## 2023-11-01 NOTE — Telephone Encounter (Signed)
 Pt/ Pharmacy is requesting for a refill of Trazadone too be sent to pharmacy.   Pt last seen 09/08/23

## 2023-11-03 ENCOUNTER — Other Ambulatory Visit (HOSPITAL_COMMUNITY): Payer: Self-pay | Admitting: Student in an Organized Health Care Education/Training Program

## 2023-11-03 ENCOUNTER — Ambulatory Visit: Payer: MEDICAID

## 2023-11-03 DIAGNOSIS — F4310 Post-traumatic stress disorder, unspecified: Secondary | ICD-10-CM

## 2023-11-03 MED ORDER — TRAZODONE HCL 100 MG PO TABS: 100.0000 mg | ORAL_TABLET | Freq: Every day | ORAL | 1 refills | Status: DC

## 2023-11-03 NOTE — Telephone Encounter (Signed)
 Done, thanks

## 2023-11-09 ENCOUNTER — Telehealth (HOSPITAL_COMMUNITY): Payer: Self-pay | Admitting: *Deleted

## 2023-11-09 NOTE — Telephone Encounter (Signed)
 Pharmacy sent a request for patient to have a 90 day supply of his quetiapine to minimize cost. He has an appt tomorrow with his provider will forward this request for his provider to consider at his appt.

## 2023-11-10 ENCOUNTER — Ambulatory Visit (HOSPITAL_COMMUNITY): Payer: MEDICAID | Admitting: Student in an Organized Health Care Education/Training Program

## 2023-11-10 ENCOUNTER — Encounter (HOSPITAL_COMMUNITY): Payer: Self-pay | Admitting: Student in an Organized Health Care Education/Training Program

## 2023-11-10 ENCOUNTER — Telehealth (HOSPITAL_COMMUNITY): Payer: Self-pay | Admitting: Licensed Clinical Social Worker

## 2023-11-10 VITALS — BP 109/66 | HR 52 | Wt 220.0 lb

## 2023-11-10 DIAGNOSIS — F411 Generalized anxiety disorder: Secondary | ICD-10-CM

## 2023-11-10 DIAGNOSIS — F1111 Opioid abuse, in remission: Secondary | ICD-10-CM

## 2023-11-10 DIAGNOSIS — F1011 Alcohol abuse, in remission: Secondary | ICD-10-CM | POA: Diagnosis not present

## 2023-11-10 DIAGNOSIS — F250 Schizoaffective disorder, bipolar type: Secondary | ICD-10-CM | POA: Diagnosis not present

## 2023-11-10 DIAGNOSIS — Z5181 Encounter for therapeutic drug level monitoring: Secondary | ICD-10-CM | POA: Diagnosis not present

## 2023-11-10 DIAGNOSIS — F1994 Other psychoactive substance use, unspecified with psychoactive substance-induced mood disorder: Secondary | ICD-10-CM

## 2023-11-10 DIAGNOSIS — F1990 Other psychoactive substance use, unspecified, uncomplicated: Secondary | ICD-10-CM

## 2023-11-10 DIAGNOSIS — F4310 Post-traumatic stress disorder, unspecified: Secondary | ICD-10-CM

## 2023-11-10 DIAGNOSIS — F1411 Cocaine abuse, in remission: Secondary | ICD-10-CM

## 2023-11-10 MED ORDER — DULOXETINE HCL 30 MG PO CPEP: 90.0000 mg | ORAL_CAPSULE | Freq: Every day | ORAL | 1 refills | Status: DC

## 2023-11-10 MED ORDER — TRAZODONE HCL 100 MG PO TABS: 100.0000 mg | ORAL_TABLET | Freq: Every day | ORAL | 1 refills | Status: DC

## 2023-11-10 MED ORDER — HYDROXYZINE HCL 25 MG PO TABS: 25.0000 mg | ORAL_TABLET | Freq: Two times a day (BID) | ORAL | 1 refills | Status: DC | PRN

## 2023-11-10 MED ORDER — DIVALPROEX SODIUM 500 MG PO DR TAB
DELAYED_RELEASE_TABLET | ORAL | 2 refills | Status: DC
Start: 2023-11-10 — End: 2023-12-01

## 2023-11-10 MED ORDER — QUETIAPINE FUMARATE 200 MG PO TABS: 200.0000 mg | ORAL_TABLET | Freq: Every day | ORAL | 1 refills | Status: DC

## 2023-11-10 MED ORDER — GABAPENTIN 600 MG PO TABS: ORAL_TABLET | ORAL | 2 refills | Status: DC

## 2023-11-10 NOTE — Telephone Encounter (Signed)
 The therapist attempts to reach Keith Gibbs at the request of Dr. Faylene Hoots leaving a HIPAA-compliant voicemail.  Melynda Stagger, MA, LCSW, [facility], LCAS 11/10/2023
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
## 2023-11-10 NOTE — Patient Instructions (Signed)
 Medication Changes  Increased Depakote  DR to 750mg  in the AM (1.5 tablets) and continued 1000mg  nightly (2 tablets) 2. Please return next week for lab work

## 2023-11-10 NOTE — Telephone Encounter (Signed)
 Pt states that you did not add trazodone  on his new list, he states that he is unsure weather he is supposed to be taking medication or not.     JNL, CMA

## 2023-11-10 NOTE — Telephone Encounter (Signed)
 Noted! Thank you

## 2023-11-13 ENCOUNTER — Telehealth: Payer: Self-pay

## 2023-11-13 ENCOUNTER — Other Ambulatory Visit: Payer: Self-pay

## 2023-11-13 DIAGNOSIS — Z87891 Personal history of nicotine dependence: Secondary | ICD-10-CM

## 2023-11-13 DIAGNOSIS — F17210 Nicotine dependence, cigarettes, uncomplicated: Secondary | ICD-10-CM

## 2023-11-13 DIAGNOSIS — Z122 Encounter for screening for malignant neoplasm of respiratory organs: Secondary | ICD-10-CM

## 2023-11-13 NOTE — Telephone Encounter (Signed)
 Lung Cancer Screening Narrative/Criteria Questionnaire (Cigarette Smokers Only- No Cigars/Pipes/vapes)   Keith Gibbs   SDMV:11/27/2023 at 11:30 am with Mathis Som        1961-03-14   LDCT: 11/28/2023 at 11:40 am at [facility]    63 y.o.   Phone: [phone]  Lung Screening Narrative (confirm age 96-77 yrs Medicare / 50-80 yrs Private pay insurance)   Insurance information:[employer]   Referring Provider:Stephens, NP   This screening involves an initial phone call with a team member from our program. It is called a shared decision making visit. The initial meeting is required by  insurance and Medicare to make sure you understand the program. This appointment takes about 15-20 minutes to complete. You will complete the screening scan at your scheduled date/time.  This scan takes about 5-10 minutes to complete. You can eat and drink normally before and after the scan.  Criteria questions for Lung Cancer Screening:   Are you a current or former smoker? Current Age began smoking: 33   If you are a former smoker, what year did you quit smoking? Quit for 3 years (within 15 yrs)   To calculate your smoking history, I need an accurate estimate of how many packs of cigarettes you smoked per day and for how many years. (Not just the number of PPD you are now smoking)   Years smoking 42 x Packs per day 1.5 = Pack years 63   (at least 20 pack yrs)   (Make sure they understand that we need to know how much they have smoked in the past, not just the number of PPD they are smoking now)  Do you have a personal history of cancer?  No    Do you have a family history of cancer? Yes  (cancer type and and relative) 2 Aunts Lung,  Grandmother small cell Uncles unknown type   Are you coughing up blood?  No  Have you had unexplained weight loss of 15 lbs or more in the last 6 months? Close to  lbs, however he states nothing taste good to him, he is no-t eating as much.   It looks like you meet all  criteria.  When would be a good time for us  to schedule you for this screening?   Additional information: N/A

## 2023-11-15 ENCOUNTER — Telehealth (HOSPITAL_COMMUNITY): Payer: Self-pay

## 2023-11-15 ENCOUNTER — Other Ambulatory Visit (INDEPENDENT_AMBULATORY_CARE_PROVIDER_SITE_OTHER): Payer: MEDICAID

## 2023-11-15 DIAGNOSIS — F250 Schizoaffective disorder, bipolar type: Secondary | ICD-10-CM | POA: Diagnosis not present

## 2023-11-15 DIAGNOSIS — Z5181 Encounter for therapeutic drug level monitoring: Secondary | ICD-10-CM

## 2023-11-15 DIAGNOSIS — Z79899 Other long term (current) drug therapy: Secondary | ICD-10-CM | POA: Diagnosis not present

## 2023-11-15 NOTE — Telephone Encounter (Signed)
 Thank you. Spoke with patient and clarified that he is to continue his trazodone . The medication was refilled earlier than his other medications, which may have contributed to it not being included on his AVS. Also spoke with patient about reaching out to therapist from [facility]. Pt endorsed understanding and will go back to [facility].

## 2023-11-15 NOTE — Progress Notes (Signed)
 Pt tolerated labs well in right arm with no complaints.    JNL, CMA

## 2023-11-15 NOTE — Telephone Encounter (Signed)
 Sending over to provider, spoke to this patient before labs as his providers  plan states that he will continue on trazodone .    JNL, CMA

## 2023-11-16 ENCOUNTER — Encounter: Payer: MEDICAID | Admitting: Physical Therapy

## 2023-11-16 LAB — COMPREHENSIVE METABOLIC PANEL WITH GFR
ALT: 6 IU/L (ref 0–44)
AST: 9 IU/L (ref 0–40)
Albumin: 4.2 g/dL (ref 3.9–4.9)
Alkaline Phosphatase: 82 IU/L (ref 44–121)
BUN/Creatinine Ratio: 12 (ref 10–24)
BUN: 11 mg/dL (ref 8–27)
Bilirubin Total: 0.3 mg/dL (ref 0.0–1.2)
CO2: 22 mmol/L (ref 20–29)
Calcium: 9.4 mg/dL (ref 8.6–10.2)
Chloride: 100 mmol/L (ref 96–106)
Creatinine, Ser: 0.94 mg/dL (ref 0.76–1.27)
Globulin, Total: 2.5 g/dL (ref 1.5–4.5)
Glucose: 88 mg/dL (ref 70–99)
Potassium: 4.9 mmol/L (ref 3.5–5.2)
Sodium: 138 mmol/L (ref 134–144)
Total Protein: 6.7 g/dL (ref 6.0–8.5)
eGFR: 91 mL/min/{1.73_m2} (ref >59)

## 2023-11-16 LAB — VALPROIC ACID LEVEL: Valproic Acid Lvl: 69 ug/mL (ref 50–100)

## 2023-11-17 NOTE — Progress Notes (Signed)
 Thank you. I called patient and let him no his labs were fine.

## 2023-11-24 ENCOUNTER — Ambulatory Visit (INDEPENDENT_AMBULATORY_CARE_PROVIDER_SITE_OTHER): Payer: MEDICAID | Admitting: Orthopedic Surgery

## 2023-11-24 ENCOUNTER — Other Ambulatory Visit (INDEPENDENT_AMBULATORY_CARE_PROVIDER_SITE_OTHER): Payer: MEDICAID

## 2023-11-24 ENCOUNTER — Encounter: Payer: Self-pay | Admitting: Orthopedic Surgery

## 2023-11-24 DIAGNOSIS — M79601 Pain in right arm: Secondary | ICD-10-CM

## 2023-11-24 MED ORDER — OXYCODONE HCL 5 MG PO TABS: 5.0000 mg | ORAL_TABLET | Freq: Two times a day (BID) | ORAL | 0 refills | Status: DC | PRN

## 2023-11-24 NOTE — Progress Notes (Signed)
 Post-Op Visit Note   Patient: Keith Gibbs           Date of Birth: [birth year]           MRN: [id number] Visit Date: 11/24/2023 PCP: Senaida Dama, NP   Assessment & Plan:  Chief Complaint:  Chief Complaint  Patient presents with   Right Shoulder - Routine Post Op      right reverse shoulder arthroplasty on 08/31/2023.     Visit Diagnoses:  1. Right arm pain     Plan: Autry Legions is a patient now about 3 months out right reverse shoulder replacement.  Does report a little bit of pain with yard work.  Having some radicular type symptoms going down the scapula back of his arm as well as sometimes in the biceps region.  Last oxycodone  prescription 10/20/2022.  He is outside a lot and he has to manage a big property at his house.  On examination he has forward flexion abduction well above 90 degrees.  No real pain with resisted abduction.  EPL FPL interosseous wrist flexion extension bicep triceps and deltoid strength is intact.  No definite paresthesias C5-T1.  Does have some pain with flexion extension and rotation of the cervical spine.  Cervical spine radiographs demonstrate significant arthritis in the mid and lower levels of the cervical spine including degenerative disc disease as well as significant facet arthritis.  Impression is well-functioning right reverse shoulder replacement.  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  Been ongoing on and off for over 6 weeks but prior to that 4 months.  Plan is one-time prescription for oxycodone  and MRI cervical spine to evaluate right-sided radiculopathy with likely ESI's to follow.  Follow-Up Instructions: No follow-ups on file.   Orders:  Orders Placed This Encounter  Procedures   XR Shoulder Right   XR Cervical Spine 2 or 3 views   MR Cervical Spine w/o contrast   Meds ordered this encounter  Medications   oxyCODONE  (OXY IR/ROXICODONE ) 5 MG immediate release tablet     Sig: Take 1 tablet (5 mg total) by mouth every 12 (twelve) hours as needed for severe pain (pain score 7-10).    Dispense:  25 tablet    Refill:  0    Imaging: No results found.  PMFS History: Patient Active Problem List   Diagnosis Date Noted   Shoulder arthritis 08/31/2023   Lumbar radiculopathy 05/30/2023   Major depressive disorder, recurrent severe without psychotic features (HCC) 01/28/2023   S/P lumbar fusion 08/23/2022   Postlaminectomy syndrome, not elsewhere classified 08/12/2022   MDD (major depressive disorder) 08/10/2022   Substance induced mood disorder (HCC) 08/06/2022   Suicide attempt by drug overdose (HCC) 08/06/2022   Toxic encephalopathy 08/03/2022   Suicide ideation 08/03/2022   Encephalopathy 08/03/2022   Polysubstance abuse (HCC) 04/28/2022   Postoperative abdominal pain 02/28/2022   Acute cholecystitis 02/17/2022   Obstruction of nasal valve 01/07/2022   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   Pulmonary nodule 12/17/2021   CAD S/P percutaneous coronary angioplasty 12/17/2021   Epigastric abdominal pain    Nausea and vomiting    Tobacco dependence due to cigarettes 08/31/2021   Iron deficiency anemia secondary to inadequate dietary iron intake 08/31/2021   Acute lead-induced gout involving toe of left foot 08/31/2021   Encounter for lipid screening for cardiovascular disease 08/31/2021   Hypotension due to drugs 06/21/2021   Opioid withdrawal (HCC) 06/21/2021  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HCC) 06/21/2021   RLS (restless legs syndrome) 05/07/2021   Acquired deviated nasal septum 05/03/2021   Encounter for surveillance of abnormal nevi 05/03/2021   Flu vaccine need 05/03/2021   Substance abuse (HCC) 05/03/2021   Drug abuse and dependence (HCC) 05/03/2021   Need for shingles vaccine 05/03/2021   Right groin pain 01/29/2021   S/P TKR (total knee replacement) using cement, right 01/19/2021   Cigarette smoker 07/21/2020   Foot pain, bilateral 12/25/2019   Potential  exposure to STD 12/12/2019   History of lumbar surgery 12/12/2019   Fatigue 12/12/2019   Penile lesion 12/12/2019   Right testicular pain 12/12/2019   Body mass index 28.0-28.9, adult 12/12/2019   History of COPD 12/12/2019   Tobacco use disorder, continuous 12/12/2019   Hepatoma (HCC) 11/05/2019   Anxiety 12/10/2018   Essential hypertension 09/18/2018   Hypercholesteremia 09/18/2018   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   Chronic anticoagulation (Plavix ) 04/19/2018   Pain medication agreement broken ([facility]) 04/19/2018   Cocaine use 04/18/2018   Cocaine abuse (HCC) (See 04/12/18 UDS) 04/18/2018   Marijuana abuse (See 04/12/18 UDS) 04/18/2018   Long term current use of opiate analgesic 04/12/2018   Cirrhosis of liver (HCC) 04/03/2018   BPH (benign prostatic hyperplasia) 04/03/2018   Hematuria, microscopic 04/03/2018   Osteoarthritis of the knee (Left) 11/02/2017   Lumbar facet syndrome (Bilateral) 11/01/2017   Spondylosis without myelopathy or radiculopathy, lumbosacral region 11/01/2017   Osteoarthritis of shoulder (Right) 11/01/2017   Osteoarthritis of acromioclavicular joint (Right) 11/01/2017   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) 11/01/2017   Osteoarthritis 11/01/2017   Cervicalgia 11/01/2017   Cervical facet syndrome 11/01/2017   DDD (degenerative disc disease), cervical 11/01/2017   DDD (degenerative disc disease), lumbar 11/01/2017   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) 09/18/2017   Other chronic pain 09/18/2017   Chronic musculoskeletal pain 09/18/2017   Chronic pain of right knee 09/11/2017   Chronic low back pain (Secondary Area of Pain) (Bilateral) 09/11/2017   Chronic hip pain (Tertiary Area of Pain) (Bilateral) (R>L) 09/11/2017   Chronic shoulder pain (Fourth Area of Pain) (Right) 09/11/2017   Spondylosis without myelopathy or radiculopathy, cervical region 09/11/2017   Chronic pain syndrome 09/11/2017   Opiate use 09/11/2017   Screen for  STD (sexually transmitted disease) 09/11/2017   Disorder of skeletal system 09/11/2017   Problems influencing health status 09/11/2017   Overdose of medication, intentional self-harm, sequela (HCC) 12/28/2016   Severe recurrent major depression without psychotic features (HCC) 12/27/2016   Alcohol  use disorder, moderate, dependence (HCC) 12/27/2016   Cannabis use disorder, moderate, dependence (HCC) 12/27/2016   Chest pain 06/10/2016   Bipolar 1 disorder (HCC) 05/27/2013   Myalgia 05/27/2013   Abnormal ejaculation 11/30/2012   Chest pain, unspecified 11/30/2012   Degenerative arthritis of hip 11/16/2012   Schizoaffective disorder (HCC) 06/27/2012   COPD (chronic obstructive pulmonary disease) (HCC) 06/27/2012   Chronic hepatitis C (HCC) 06/27/2012   GERD (gastroesophageal reflux disease) 06/27/2012   Complete rupture of rotator cuff 02/10/2012   Cocaine abuse in remission (HCC) 12/05/2011   Unspecified osteoarthritis, unspecified site 07/27/2011   Past Medical History:  Diagnosis Date   Anginal pain (HCC)    Anxiety    Arthritis    Bipolar 1 disorder (HCC)    Bursitis    CAD (coronary artery disease)    Chronic pain    COPD (chronic obstructive pulmonary disease) (HCC)    Current use of long term anticoagulation  DAPT (ASA + clopidogrel )   Depression    Diverticulitis    Dyspnea    GERD (gastroesophageal reflux disease)    Grade I diastolic dysfunction    Hepatitis C 2012   No longer has Hep C   HLD (hyperlipidemia)    Hypertension    MI (myocardial infarction) (HCC)    Polysubstance abuse (HCC)    cocaine, marijuana, ETOH   PUD (peptic ulcer disease)    S/P angioplasty with stent 06/10/2016   a.) 90% stenosis of pLAD to mLAD - 2.5 x 18 mm Xience Alpine (DES x 1) placed to pLAD   S/P PTCA (percutaneous transluminal coronary angioplasty) 12/04/2019   a.) 60% in stent restenosis of DES to pLAD; LVEF 65%.   Schizophrenia (HCC)    Stroke [facility])    Valvular  insufficiency    a.) Mild MR, TR, PR; mild to moderate AR on 03/05/2018 TTE    Family History  Problem Relation Age of Onset   Osteoarthritis Mother    Heart disease Mother    Hypertension Mother    Depression Mother    Heart disease Father    Early death Father    Hypertension Father    Heart attack Father    Hypertension Sister    Parkinson's disease Maternal Grandfather    Prostate cancer Neg Hx    Bladder Cancer Neg Hx    Kidney cancer Neg Hx    Tremor Neg Hx     Past Surgical History:  Procedure Laterality Date   ABDOMINAL SURGERY     removed small piece of intestines due to [facility] Diverticulosis   ANTERIOR LAT LUMBAR FUSION N/A 08/23/2022   Procedure: DIRECT LATERAL INTERBODY FUSION  LUMBAR TWO- LUMBAR THREE, LUMBAR THREE-LUMBAR FOUR , EXPLORE AND EXTEND FUSION LUMBAR TWO-LUMBAR FIVE, POSTERIOR DECOMPRESSION LUMBAR THREE-LUMBAR FOUR, LEFT LUMBAR TWO-LUMBAR THREE;  Surgeon: Van Gelinas, MD;  Location: [facility] OR;  Service: Neurosurgery;  Laterality: N/A;   APPENDECTOMY     BACK SURGERY     CARDIAC CATHETERIZATION Left 06/10/2016   Procedure: Left Heart Cath and Coronary Angiography;  Surgeon: Cherrie Cornwall, MD;  Location: [facility] INVASIVE CV LAB;  Service: Cardiovascular;  Laterality: Left;   CARDIAC CATHETERIZATION N/A 06/10/2016   Procedure: Coronary Stent Intervention;  Surgeon: Antonette Batters, MD;  Location: [facility] INVASIVE CV LAB;  Service: Cardiovascular;  Laterality: N/A;   CHOLECYSTECTOMY N/A 02/17/2022   Procedure: LAPAROSCOPIC CHOLECYSTECTOMY;  Surgeon: Dorrie Gaudier Alphonso Aschoff, MD;  Location: [facility] OR;  Service: General;  Laterality: N/A;   COLON SURGERY     COLONOSCOPY     COLONOSCOPY WITH PROPOFOL  N/A 01/05/2017   Procedure: COLONOSCOPY WITH PROPOFOL ;  Surgeon: Luke Salaam, MD;  Location: [facility] ENDOSCOPY;  Service: Endoscopy;  Laterality: N/A;   COLONOSCOPY WITH PROPOFOL  N/A 02/13/2020   Procedure: COLONOSCOPY WITH PROPOFOL ;  Surgeon: Irby Mannan, MD;   Location: [facility] ENDOSCOPY;  Service: Endoscopy;  Laterality: N/A;   CORONARY ANGIOPLASTY WITH STENT PLACEMENT     CORONARY PRESSURE/FFR STUDY N/A 12/04/2019   Procedure: INTRAVASCULAR PRESSURE WIRE/FFR STUDY;  Surgeon: Arnoldo Lapping, MD;  Location: [facility] INVASIVE CV LAB;  Service: Cardiovascular;  Laterality: N/A;   ESOPHAGOGASTRODUODENOSCOPY (EGD) WITH PROPOFOL  N/A 01/05/2017   Procedure: ESOPHAGOGASTRODUODENOSCOPY (EGD) WITH PROPOFOL ;  Surgeon: Luke Salaam, MD;  Location: [facility] ENDOSCOPY;  Service: Endoscopy;  Laterality: N/A;   ESOPHAGOGASTRODUODENOSCOPY (EGD) WITH PROPOFOL  N/A 02/13/2020   Procedure: ESOPHAGOGASTRODUODENOSCOPY (EGD) WITH PROPOFOL ;  Surgeon: Irby Mannan, MD;  Location: [facility] ENDOSCOPY;  Service:  Endoscopy;  Laterality: N/A;   KNEE ARTHROSCOPY WITH MEDIAL MENISECTOMY Right 09/05/2017   Procedure: KNEE ARTHROSCOPY WITH MEDIAL AND LATERAL  MENISECTOMY PARTIAL SYNOVECTOMY;  Surgeon: Molli Angelucci, MD;  Location: [facility] ORS;  Service: Orthopedics;  Laterality: Right;   LEFT HEART CATH AND CORONARY ANGIOGRAPHY N/A 12/04/2019   Procedure: LEFT HEART CATH AND CORONARY ANGIOGRAPHY;  Surgeon: Arnoldo Lapping, MD;  Location: [facility] INVASIVE CV LAB;  Service: Cardiovascular;  Laterality: N/A;   LEFT HEART CATH AND CORONARY ANGIOGRAPHY Left 11/25/2022   Procedure: LEFT HEART CATH AND CORONARY ANGIOGRAPHY;  Surgeon: Cherrie Cornwall, MD;  Location: [facility] INVASIVE CV LAB;  Service: Cardiovascular;  Laterality: Left;   REVERSE SHOULDER ARTHROPLASTY Right 08/31/2023   Procedure: RIGHT REVERSE SHOULDER ARTHROPLASTY;  Surgeon: Jasmine Mesi, MD;  Location: [facility] OR;  Service: Orthopedics;  Laterality: Right;   SHOULDER SURGERY Right 04/09/2012   SPINE SURGERY     TOTAL KNEE ARTHROPLASTY Right 01/19/2021   Procedure: TOTAL KNEE ARTHROPLASTY - Thomos Flies to Assist;  Surgeon: Molli Angelucci, MD;  Location: [facility] ORS;  Service: Orthopedics;  Laterality: Right;   Social History   Occupational History    Occupation: disability   Occupation: stocking at gas station    Comment: 2 days per week  Tobacco Use   Smoking status: Some Days    Current packs/day: 0.00    Average packs/day: 0.5 packs/day for 32.0 years (16.0 ttl pk-yrs)    Types: Cigarettes    Start date: 10/25/1989    Last attempt to quit: 10/25/2021    Years since quitting: 2.0    Passive exposure: Current   Smokeless tobacco: Never   Tobacco comments:     Quit 06/2023  Vaping Use   Vaping status: Never Used  Substance and Sexual Activity   Alcohol  use: Not Currently    Alcohol /week: 3.0 standard drinks of alcohol     Types: 3 Cans of beer per week    Comment: occassionally    Drug use: Not Currently    Types: Cocaine, Marijuana    Comment: last on saturday   Sexual activity: Not Currently    Partners: Female    Birth control/protection: None

## 2023-11-27 ENCOUNTER — Ambulatory Visit: Payer: MEDICAID | Admitting: Acute Care

## 2023-11-27 DIAGNOSIS — F17210 Nicotine dependence, cigarettes, uncomplicated: Secondary | ICD-10-CM

## 2023-11-27 NOTE — Patient Instructions (Signed)

## 2023-11-27 NOTE — Progress Notes (Addendum)
  Virtual Visit via Telephone Note  I connected with Keith Gibbs on 11/27/23 at 11:30 AM EDT by telephone and verified that I am speaking with the correct person using two identifiers.  Location: Patient: Keith Gibbs Provider: Laneta Speaks, RN   I discussed the limitations, risks, security and privacy concerns of performing an evaluation and management service by telephone and the availability of in person appointments. I also discussed with the patient that there may be a patient responsible charge related to this service. The patient expressed understanding and agreed to proceed.    Shared Decision Making Visit Lung Cancer Screening Program [phone])   Eligibility: Age 63 y.o. Pack Years Smoking History Calculation 63 (# packs/per year x # years smoked) Recent History of coughing up blood  no Unexplained weight loss? yes ( >Than 15 pounds within the last 6 months ) Prior History Lung / other cancer no (Diagnosis within the last 5 years already requiring surveillance chest CT Scans). Smoking Status Current Smoker Former Smokers: Years since quit: n/a  Quit Date: n/a  Visit Components: Discussion included one or more decision making aids. yes Discussion included risk/benefits of screening. yes Discussion included potential follow up diagnostic testing for abnormal scans. yes Discussion included meaning and risk of over diagnosis. yes Discussion included meaning and risk of False Positives. yes Discussion included meaning of total radiation exposure. yes  Counseling Included: Importance of adherence to annual lung cancer LDCT screening. yes Impact of comorbidities on ability to participate in the program. yes Ability and willingness to under diagnostic treatment. yes  Smoking Cessation Counseling: Current Smokers:  Discussed importance of smoking cessation. yes Information about tobacco cessation classes and interventions provided to patient. yes Patient provided  with ticket for LDCT Scan. no Symptomatic Patient. yes  Counseling(Intermediate counseling: > three minutes) 99406 Diagnosis Code: Tobacco Use Z72.0 Asymptomatic Patient yes  Counseling (Intermediate counseling: > three minutes counseling) [id number] Former Smokers:  Discussed the importance of maintaining cigarette abstinence. yes Diagnosis Code: Personal History of Nicotine  Dependence. S12.108 Information about tobacco cessation classes and interventions provided to patient. Yes Patient provided with ticket for LDCT Scan. no Written Order for Lung Cancer Screening with LDCT placed in Epic. Yes (CT Chest Lung Cancer Screening Low Dose W/O CM) [id number] Z12.2-Screening of respiratory organs Z87.891-Personal history of nicotine  dependence   Laneta Speaks, RN   Provider Attestation I agree with the documentation of the Shared Decision Making visit,  smoking cessation counseling if appropriate, and verification or eligibility for lung cancer screening as documented by the RN Nurse Navigator.   Lauraine PHEBE Lites, MSN, AGACNP-BC [place] [facility] See Amion for personal pager PCCM on call pager [phone]    Provider Attestation I agree with the documentation of the Shared Decision Making visit,  smoking cessation counseling if appropriate, and verification or eligibility for lung cancer screening as documented by the RN Nurse Navigator.   Lauraine PHEBE Lites, MSN, AGACNP-BC [place] [facility] See Amion for personal pager PCCM on call pager [phone]

## 2023-11-28 ENCOUNTER — Ambulatory Visit
Admission: RE | Admit: 2023-11-28 | Discharge: 2023-11-28 | Disposition: A | Discharge status: Home / Self Care | Payer: MEDICAID | Source: Ambulatory Visit | Attending: Family | Admitting: Family

## 2023-11-28 DIAGNOSIS — Z87891 Personal history of nicotine dependence: Secondary | ICD-10-CM

## 2023-11-28 DIAGNOSIS — F17210 Nicotine dependence, cigarettes, uncomplicated: Secondary | ICD-10-CM

## 2023-11-28 DIAGNOSIS — Z122 Encounter for screening for malignant neoplasm of respiratory organs: Secondary | ICD-10-CM

## 2023-12-01 ENCOUNTER — Telehealth (HOSPITAL_COMMUNITY): Payer: MEDICAID | Admitting: Student in an Organized Health Care Education/Training Program

## 2023-12-01 ENCOUNTER — Encounter (HOSPITAL_COMMUNITY): Payer: Self-pay | Admitting: Student in an Organized Health Care Education/Training Program

## 2023-12-01 DIAGNOSIS — F1011 Alcohol abuse, in remission: Secondary | ICD-10-CM | POA: Diagnosis not present

## 2023-12-01 DIAGNOSIS — F250 Schizoaffective disorder, bipolar type: Secondary | ICD-10-CM

## 2023-12-01 DIAGNOSIS — F1411 Cocaine abuse, in remission: Secondary | ICD-10-CM | POA: Diagnosis not present

## 2023-12-01 DIAGNOSIS — F1111 Opioid abuse, in remission: Secondary | ICD-10-CM

## 2023-12-01 DIAGNOSIS — F4310 Post-traumatic stress disorder, unspecified: Secondary | ICD-10-CM | POA: Diagnosis not present

## 2023-12-01 DIAGNOSIS — F411 Generalized anxiety disorder: Secondary | ICD-10-CM

## 2023-12-01 MED ORDER — QUETIAPINE FUMARATE 200 MG PO TABS: 200.0000 mg | ORAL_TABLET | Freq: Every day | ORAL | 0 refills | Status: DC

## 2023-12-01 MED ORDER — GABAPENTIN 600 MG PO TABS
ORAL_TABLET | ORAL | 1 refills | Status: DC
Start: 2023-12-01 — End: 2024-02-08

## 2023-12-01 MED ORDER — HYDROXYZINE HCL 25 MG PO TABS
25.0000 mg | ORAL_TABLET | Freq: Two times a day (BID) | ORAL | 0 refills | Status: DC | PRN
Start: 2023-12-01 — End: 2024-02-08

## 2023-12-01 MED ORDER — TRAZODONE HCL 100 MG PO TABS: 100.0000 mg | ORAL_TABLET | Freq: Every day | ORAL | 0 refills | Status: DC

## 2023-12-01 MED ORDER — DULOXETINE HCL 30 MG PO CPEP: 90.0000 mg | ORAL_CAPSULE | Freq: Every day | ORAL | 0 refills | Status: DC

## 2023-12-01 MED ORDER — DIVALPROEX SODIUM 500 MG PO DR TAB: DELAYED_RELEASE_TABLET | ORAL | 3 refills | Status: DC
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
## 2023-12-05 ENCOUNTER — Telehealth: Payer: Self-pay | Admitting: *Deleted

## 2023-12-05 NOTE — Telephone Encounter (Signed)
 Spoke with patient. He is requesting lung screening CT results from 11/28/23. Advised pt that the results are still pending and as soon as we receive the results we will let him know. Pt verbalized understanding.

## 2023-12-06 ENCOUNTER — Encounter: Payer: Self-pay | Admitting: Orthopedic Surgery

## 2023-12-07 ENCOUNTER — Other Ambulatory Visit: Payer: Self-pay | Admitting: Surgical

## 2023-12-11 ENCOUNTER — Other Ambulatory Visit: Payer: MEDICAID

## 2023-12-12 ENCOUNTER — Other Ambulatory Visit: Payer: Self-pay | Admitting: Medical Genetics

## 2023-12-19 ENCOUNTER — Ambulatory Visit (INDEPENDENT_AMBULATORY_CARE_PROVIDER_SITE_OTHER): Payer: MEDICAID | Admitting: Family

## 2023-12-19 ENCOUNTER — Encounter: Payer: Self-pay | Admitting: Family

## 2023-12-19 VITALS — BP 112/69 | HR 50 | Temp 97.7°F | Resp 18 | Ht 75.0 in | Wt 223.0 lb

## 2023-12-19 DIAGNOSIS — K219 Gastro-esophageal reflux disease without esophagitis: Secondary | ICD-10-CM

## 2023-12-19 DIAGNOSIS — J449 Chronic obstructive pulmonary disease, unspecified: Secondary | ICD-10-CM

## 2023-12-19 DIAGNOSIS — W57XXXA Bitten or stung by nonvenomous insect and other nonvenomous arthropods, initial encounter: Secondary | ICD-10-CM | POA: Diagnosis not present

## 2023-12-19 DIAGNOSIS — J431 Panlobular emphysema: Secondary | ICD-10-CM

## 2023-12-19 MED ORDER — ALBUTEROL SULFATE HFA 108 (90 BASE) MCG/ACT IN AERS: 2.0000 | INHALATION_SPRAY | Freq: Four times a day (QID) | RESPIRATORY_TRACT | 2 refills | Status: DC | PRN

## 2023-12-19 MED ORDER — BREZTRI AEROSPHERE 160-9-4.8 MCG/ACT IN AERO: 2.0000 | INHALATION_SPRAY | Freq: Two times a day (BID) | RESPIRATORY_TRACT | 2 refills | Status: DC

## 2023-12-19 MED ORDER — PANTOPRAZOLE SODIUM 40 MG PO TBEC: 40.0000 mg | DELAYED_RELEASE_TABLET | Freq: Every day | ORAL | 0 refills | Status: DC

## 2023-12-19 MED ORDER — DOXYCYCLINE HYCLATE 100 MG PO TABS: 200.0000 mg | ORAL_TABLET | Freq: Once | ORAL | 0 refills | Status: AC

## 2023-12-19 NOTE — Progress Notes (Signed)
 Patient ID: Keith Gibbs, male    DOB: October 16, 1960  MRN: [id number]  CC: Chronic Conditions Follow-Up  Subjective: Keith Gibbs is a 64 y.o. male who presents for chronic conditions follow-up.   His concerns today include:  - Doing well on Albuterol  inhaler and Budeson-Glycopyrrol-Formoterol  inhaler, no issues/concerns. Denies red flag symptoms. Last seen by Pulmonology on 11/27/2023. - Doing well on Pantoprazole , no issues/concerns.   Patient Active Problem List   Diagnosis Date Noted   Shoulder arthritis 08/31/2023   Lumbar radiculopathy 05/30/2023   Major depressive disorder, recurrent severe without psychotic features (HCC) 01/28/2023   S/P lumbar fusion 08/23/2022   Postlaminectomy syndrome, not elsewhere classified 08/12/2022   MDD (major depressive disorder) 08/10/2022   Substance induced mood disorder (HCC) 08/06/2022   Suicide attempt by drug overdose (HCC) 08/06/2022   Toxic encephalopathy 08/03/2022   Suicide ideation 08/03/2022   Encephalopathy 08/03/2022   Polysubstance abuse (HCC) 04/28/2022   Postoperative abdominal pain 02/28/2022   Acute cholecystitis 02/17/2022   Obstruction of nasal valve 01/07/2022   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   Pulmonary nodule 12/17/2021   CAD S/P percutaneous coronary angioplasty 12/17/2021   Epigastric abdominal pain    Nausea and vomiting    Tobacco dependence due to cigarettes 08/31/2021   Iron deficiency anemia secondary to inadequate dietary iron intake 08/31/2021   Acute lead-induced gout involving toe of left foot 08/31/2021   Encounter for lipid screening for cardiovascular disease 08/31/2021   Hypotension due to drugs 06/21/2021   Opioid withdrawal (HCC) 06/21/2021   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HCC) 06/21/2021   RLS (restless legs syndrome) 05/07/2021   Acquired deviated nasal septum 05/03/2021   Encounter for surveillance of abnormal nevi 05/03/2021   Flu vaccine need 05/03/2021   Substance abuse (HCC) 05/03/2021   Drug abuse and  dependence (HCC) 05/03/2021   Need for shingles vaccine 05/03/2021   Right groin pain 01/29/2021   S/P TKR (total knee replacement) using cement, right 01/19/2021   Cigarette smoker 07/21/2020   Foot pain, bilateral 12/25/2019   Potential exposure to STD 12/12/2019   History of lumbar surgery 12/12/2019   Fatigue 12/12/2019   Penile lesion 12/12/2019   Right testicular pain 12/12/2019   Body mass index 28.0-28.9, adult 12/12/2019   History of COPD 12/12/2019   Tobacco use disorder, continuous 12/12/2019   Hepatoma (HCC) 11/05/2019   Anxiety 12/10/2018   Essential hypertension 09/18/2018   Hypercholesteremia 09/18/2018   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   Chronic anticoagulation (Plavix ) 04/19/2018   Pain medication agreement broken ([facility]) 04/19/2018   Cocaine use 04/18/2018   Cocaine abuse (HCC) (See 04/12/18 UDS) 04/18/2018   Marijuana abuse (See 04/12/18 UDS) 04/18/2018   Long term current use of opiate analgesic 04/12/2018   Cirrhosis of liver (HCC) 04/03/2018   BPH (benign prostatic hyperplasia) 04/03/2018   Hematuria, microscopic 04/03/2018   Osteoarthritis of the knee (Left) 11/02/2017   Lumbar facet syndrome (Bilateral) 11/01/2017   Spondylosis without myelopathy or radiculopathy, lumbosacral region 11/01/2017   Osteoarthritis of shoulder (Right) 11/01/2017   Osteoarthritis of acromioclavicular joint (Right) 11/01/2017   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) 11/01/2017   Osteoarthritis 11/01/2017   Cervicalgia 11/01/2017   Cervical facet syndrome 11/01/2017   DDD (degenerative disc disease), cervical 11/01/2017   DDD (degenerative disc disease), lumbar 11/01/2017   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) 09/18/2017   Other chronic pain 09/18/2017   Chronic musculoskeletal pain 09/18/2017   Chronic pain of right knee 09/11/2017   Chronic low  back pain (Secondary Area of Pain) (Bilateral) 09/11/2017   Chronic hip pain [facility] Area of Pain)  (Bilateral) (R>L) 09/11/2017   Chronic shoulder pain (Fourth Area of Pain) (Right) 09/11/2017   Spondylosis without myelopathy or radiculopathy, cervical region 09/11/2017   Chronic pain syndrome 09/11/2017   Opiate use 09/11/2017   Screen for STD (sexually transmitted disease) 09/11/2017   Disorder of skeletal system 09/11/2017   Problems influencing health status 09/11/2017   Overdose of medication, intentional self-harm, sequela (HCC) 12/28/2016   Severe recurrent major depression without psychotic features (HCC) 12/27/2016   Alcohol  use disorder, moderate, dependence (HCC) 12/27/2016   Cannabis use disorder, moderate, dependence (HCC) 12/27/2016   Chest pain 06/10/2016   Bipolar 1 disorder (HCC) 05/27/2013   Myalgia 05/27/2013   Abnormal ejaculation 11/30/2012   Chest pain, unspecified 11/30/2012   Degenerative arthritis of hip 11/16/2012   Schizoaffective disorder (HCC) 06/27/2012   COPD (chronic obstructive pulmonary disease) (HCC) 06/27/2012   Chronic hepatitis C (HCC) 06/27/2012   GERD (gastroesophageal reflux disease) 06/27/2012   Complete rupture of rotator cuff 02/10/2012   Cocaine abuse in remission (HCC) 12/05/2011   Unspecified osteoarthritis, unspecified site 07/27/2011     Current Outpatient Medications on File Prior to Visit  Medication Sig Dispense Refill   acetaminophen  (TYLENOL ) 325 MG tablet Take 1 tablet (325 mg total) by mouth every 6 (six) hours as needed for mild pain (pain score 1-3) (or temp > 100.5). 60 tablet 0   aspirin  EC 81 MG tablet Take 1 tablet (81 mg total) by mouth daily. Swallow whole. 30 tablet 0   atorvastatin  (LIPITOR ) 80 MG tablet Take 1 tablet (80 mg total) by mouth daily. 30 tablet 0   celecoxib  (CELEBREX ) 200 MG capsule TAKE 1 CAPSULE BY MOUTH TWICE A DAY 40 capsule 0   divalproex  (DEPAKOTE ) 500 MG DR tablet Take half tablet in the morning and 3 tablets at bedtime 105 tablet 3   DULoxetine  (CYMBALTA ) 30 MG capsule Take 3 capsules (90 mg  total) by mouth daily. 270 capsule 0   gabapentin  (NEURONTIN ) 600 MG tablet Take 1 tablet (600 mg total) by mouth daily AND 2 tablets (1,200 mg total) at bedtime. 90 tablet 1   hydrOXYzine  (ATARAX ) 25 MG tablet Take 1 tablet (25 mg total) by mouth 2 (two) times daily as needed. 270 tablet 0   nitroGLYCERIN  (NITROSTAT ) 0.4 MG SL tablet Place 1 tablet (0.4 mg total) under the tongue every 5 (five) minutes as needed for chest pain. 30 tablet 0   QUEtiapine  (SEROQUEL ) 200 MG tablet Take 1 tablet (200 mg total) by mouth at bedtime. 90 tablet 0   traZODone  (DESYREL ) 100 MG tablet Take 1 tablet (100 mg total) by mouth at bedtime. 90 tablet 0   docusate sodium  (COLACE) 100 MG capsule Take 1 capsule (100 mg total) by mouth 2 (two) times daily. (Patient not taking: Reported on 12/19/2023) 10 capsule 0   oxyCODONE  (OXY IR/ROXICODONE ) 5 MG immediate release tablet Take 1 tablet (5 mg total) by mouth every 12 (twelve) hours as needed for moderate pain (pain score 4-6) (pain score 4-6). (Patient not taking: Reported on 12/19/2023) 30 tablet 0   oxyCODONE  (OXY IR/ROXICODONE ) 5 MG immediate release tablet Take 1 tablet (5 mg total) by mouth every 12 (twelve) hours as needed for severe pain (pain score 7-10). (Patient not taking: Reported on 12/19/2023) 25 tablet 0   No current facility-administered medications on file prior to visit.    Allergies  Allergen Reactions  Asenapine Other (See Comments) and Nausea And Vomiting    Increased tremors   Dextromethorphan Hbr    Guaifenesin     Latuda [Lurasidone Hcl] Other (See Comments)    Tremors     Lurasidone Other (See Comments) and Hives    Other Reaction(s): Angioedema   Phenylephrine      Social History   Socioeconomic History   Marital status: Widowed    Spouse name: Not on file   Number of children: Not on file   Years of education: Not on file   Highest education level: GED or equivalent  Occupational History   Occupation: disability   Occupation:  stocking at gas station    Comment: 2 days per week  Tobacco Use   Smoking status: Former    Current packs/day: 1.50    Average packs/day: 1.5 packs/day for 42.1 years (63.2 ttl pk-yrs)    Types: Cigarettes    Start date: 10/26/1978    Quit date: 2000    Passive exposure: Current   Smokeless tobacco: Never  Vaping Use   Vaping status: Never Used  Substance and Sexual Activity   Alcohol  use: Not Currently    Alcohol /week: 3.0 standard drinks of alcohol     Types: 3 Cans of beer per week    Comment: occassionally    Drug use: Not Currently    Types: Cocaine, Marijuana    Comment: last on saturday   Sexual activity: Not Currently    Partners: Female    Birth control/protection: None  Other Topics Concern   Not on file  Social History Narrative   Not on file   Social Drivers of Health   Financial Resource Strain: High Risk (09/19/2023)   Overall Financial Resource Strain (CARDIA)    Difficulty of Paying Living Expenses: Hard  Food Insecurity: Food Insecurity Present (09/19/2023)   Hunger Vital Sign    Worried About Running Out of Food in the Last Year: Often true    Ran Out of Food in the Last Year: Sometimes true  Transportation Needs: No Transportation Needs (09/19/2023)   PRAPARE - Administrator, Civil Service (Medical): No    Lack of Transportation (Non-Medical): No  Physical Activity: Unknown (09/19/2023)   Exercise Vital Sign    Days of Exercise per Week: 0 days    Minutes of Exercise per Session: Not on file  Stress: Stress Concern Present (09/19/2023)   [employer] of Occupational Health - Occupational Stress Questionnaire    Feeling of Stress : Rather much  Social Connections: Moderately Isolated (09/19/2023)   Social Connection and Isolation Panel [NHANES]    Frequency of Communication with Friends and Family: More than three times a week    Frequency of Social Gatherings with Friends and Family: More than three times a week    Attends Religious  Services: More than 4 times per year    Active Member of [employer] or Organizations: No    Attends Banker Meetings: Not on file    Marital Status: Widowed  Intimate Partner Violence: Not At Risk (09/01/2023)   Humiliation, Afraid, Rape, and Kick questionnaire    Fear of Current or Ex-Partner: No    Emotionally Abused: No    Physically Abused: No    Sexually Abused: No    Family History  Problem Relation Age of Onset   Osteoarthritis Mother    Heart disease Mother    Hypertension Mother    Depression Mother    Heart disease Father  Early death Father    Hypertension Father    Heart attack Father    Hypertension Sister    Parkinson's disease Maternal Grandfather    Prostate cancer Neg Hx    Bladder Cancer Neg Hx    Kidney cancer Neg Hx    Tremor Neg Hx     Past Surgical History:  Procedure Laterality Date   ABDOMINAL SURGERY     removed small piece of intestines due to [facility] Diverticulosis   ANTERIOR LAT LUMBAR FUSION N/A 08/23/2022   Procedure: DIRECT LATERAL INTERBODY FUSION  LUMBAR TWO- LUMBAR THREE, LUMBAR THREE-LUMBAR FOUR , EXPLORE AND EXTEND FUSION LUMBAR TWO-LUMBAR FIVE, POSTERIOR DECOMPRESSION LUMBAR THREE-LUMBAR FOUR, LEFT LUMBAR TWO-LUMBAR THREE;  Surgeon: Van Gelinas, MD;  Location: [facility] OR;  Service: Neurosurgery;  Laterality: N/A;   APPENDECTOMY     BACK SURGERY     CARDIAC CATHETERIZATION Left 06/10/2016   Procedure: Left Heart Cath and Coronary Angiography;  Surgeon: Cherrie Cornwall, MD;  Location: [facility] INVASIVE CV LAB;  Service: Cardiovascular;  Laterality: Left;   CARDIAC CATHETERIZATION N/A 06/10/2016   Procedure: Coronary Stent Intervention;  Surgeon: Antonette Batters, MD;  Location: [facility] INVASIVE CV LAB;  Service: Cardiovascular;  Laterality: N/A;   CHOLECYSTECTOMY N/A 02/17/2022   Procedure: LAPAROSCOPIC CHOLECYSTECTOMY;  Surgeon: Dorrie Gaudier Alphonso Aschoff, MD;  Location: [facility] OR;  Service: General;  Laterality: N/A;   COLON SURGERY      COLONOSCOPY     COLONOSCOPY WITH PROPOFOL  N/A 01/05/2017   Procedure: COLONOSCOPY WITH PROPOFOL ;  Surgeon: Luke Salaam, MD;  Location: [facility] ENDOSCOPY;  Service: Endoscopy;  Laterality: N/A;   COLONOSCOPY WITH PROPOFOL  N/A 02/13/2020   Procedure: COLONOSCOPY WITH PROPOFOL ;  Surgeon: Irby Mannan, MD;  Location: [facility] ENDOSCOPY;  Service: Endoscopy;  Laterality: N/A;   CORONARY ANGIOPLASTY WITH STENT PLACEMENT     CORONARY PRESSURE/FFR STUDY N/A 12/04/2019   Procedure: INTRAVASCULAR PRESSURE WIRE/FFR STUDY;  Surgeon: Arnoldo Lapping, MD;  Location: [facility] INVASIVE CV LAB;  Service: Cardiovascular;  Laterality: N/A;   ESOPHAGOGASTRODUODENOSCOPY (EGD) WITH PROPOFOL  N/A 01/05/2017   Procedure: ESOPHAGOGASTRODUODENOSCOPY (EGD) WITH PROPOFOL ;  Surgeon: Luke Salaam, MD;  Location: [facility] ENDOSCOPY;  Service: Endoscopy;  Laterality: N/A;   ESOPHAGOGASTRODUODENOSCOPY (EGD) WITH PROPOFOL  N/A 02/13/2020   Procedure: ESOPHAGOGASTRODUODENOSCOPY (EGD) WITH PROPOFOL ;  Surgeon: Irby Mannan, MD;  Location: [facility] ENDOSCOPY;  Service: Endoscopy;  Laterality: N/A;   KNEE ARTHROSCOPY WITH MEDIAL MENISECTOMY Right 09/05/2017   Procedure: KNEE ARTHROSCOPY WITH MEDIAL AND LATERAL  MENISECTOMY PARTIAL SYNOVECTOMY;  Surgeon: Molli Angelucci, MD;  Location: [facility] ORS;  Service: Orthopedics;  Laterality: Right;   LEFT HEART CATH AND CORONARY ANGIOGRAPHY N/A 12/04/2019   Procedure: LEFT HEART CATH AND CORONARY ANGIOGRAPHY;  Surgeon: Arnoldo Lapping, MD;  Location: [facility] INVASIVE CV LAB;  Service: Cardiovascular;  Laterality: N/A;   LEFT HEART CATH AND CORONARY ANGIOGRAPHY Left 11/25/2022   Procedure: LEFT HEART CATH AND CORONARY ANGIOGRAPHY;  Surgeon: Cherrie Cornwall, MD;  Location: [facility] INVASIVE CV LAB;  Service: Cardiovascular;  Laterality: Left;   REVERSE SHOULDER ARTHROPLASTY Right 08/31/2023   Procedure: RIGHT REVERSE SHOULDER ARTHROPLASTY;  Surgeon: Jasmine Mesi, MD;  Location: [facility] OR;  Service: Orthopedics;   Laterality: Right;   SHOULDER SURGERY Right 04/09/2012   SPINE SURGERY     TOTAL KNEE ARTHROPLASTY Right 01/19/2021   Procedure: TOTAL KNEE ARTHROPLASTY - Thomos Flies to Assist;  Surgeon: Molli Angelucci, MD;  Location: [facility] ORS;  Service: Orthopedics;  Laterality: Right;    ROS: Review of Systems  Negative except as stated above  PHYSICAL EXAM: BP 112/69   Pulse (!) 50   Temp 97.7 F (36.5 C) (Oral)   Resp 18   Ht 6\' 3"  (1.905 m)   Wt 223 lb (101.2 kg)   SpO2 93%   BMI 27.87 kg/m   Physical Exam HENT:     Head: Normocephalic and atraumatic.     Nose: Nose normal.     Mouth/Throat:     Mouth: Mucous membranes are moist.     Pharynx: Oropharynx is clear.  Eyes:     Extraocular Movements: Extraocular movements intact.     Conjunctiva/sclera: Conjunctivae normal.     Pupils: Pupils are equal, round, and reactive to light.  Cardiovascular:     Rate and Rhythm: Bradycardia present.     Pulses: Normal pulses.     Heart sounds: Normal heart sounds.  Pulmonary:     Effort: Pulmonary effort is normal.     Breath sounds: Normal breath sounds.  Musculoskeletal:        General: Normal range of motion.     Cervical back: Normal range of motion and neck supple.  Skin:    General: Skin is warm and dry.     Comments: Red area with central hyperpigmented area of left buttocks, no drainage. Alona Jamaica, CMA present.  Neurological:     General: No focal deficit present.     Mental Status: He is alert and oriented to person, place, and time.  Psychiatric:        Mood and Affect: Mood normal.        Behavior: Behavior normal.     ASSESSMENT AND PLAN: 1. Chronic obstructive pulmonary disease, unspecified COPD type (HCC) (Primary) 2. Panlobular emphysema (HCC) - Continue Albuterol  inhaler and Budesonide -Glycopyrrolate -Formoterol  inhaler as prescribed. Counseled on medication adherence/adverse effects.  - Keep all scheduled appointments with Pulmonology.  - Follow-up with  primary provider as scheduled. - budesonide -glycopyrrolate -formoterol  (BREZTRI  AEROSPHERE) 160-9-4.8 MCG/ACT AERO inhaler; Inhale 2 puffs into the lungs in the morning and at bedtime.  Dispense: 1 each; Refill: 2 - albuterol  (VENTOLIN  HFA) 108 (90 Base) MCG/ACT inhaler; Inhale 2 puffs into the lungs every 6 (six) hours as needed for wheezing or shortness of breath.  Dispense: 18 g; Refill: 2  3. Gastroesophageal reflux disease, unspecified whether esophagitis present - Continue Pantoprazole  as prescribed. Counseled on medication adherence/adverse effects.  - Follow-up with primary provider in 3 months or sooner if needed. - pantoprazole  (PROTONIX ) 40 MG tablet; Take 1 tablet (40 mg total) by mouth daily.  Dispense: 90 tablet; Refill: 0  4. Tick bite, unspecified site, initial encounter - Doxycycline as prescribed for prophylaxis. Counseled on medication adherence/adverse effects.  - Routine screening.  - Follow-up with primary provider as scheduled. - Lyme Disease Serology w/Reflex - doxycycline (VIBRA-TABS) 100 MG tablet; Take 2 tablets (200 mg total) by mouth once for 1 dose.  Dispense: 2 tablet; Refill: 0   Patient was given the opportunity to ask questions.  Patient verbalized understanding of the plan and was able to repeat key elements of the plan. Patient was given clear instructions to go to Emergency Department or return to medical center if symptoms don't improve, worsen, or new problems develop.The patient verbalized understanding.   Orders Placed This Encounter  Procedures   Lyme Disease Serology w/Reflex     Requested Prescriptions   Signed Prescriptions Disp Refills   budesonide -glycopyrrolate -formoterol  (BREZTRI  AEROSPHERE) 160-9-4.8 MCG/ACT AERO inhaler 1 each 2    Sig: Inhale  2 puffs into the lungs in the morning and at bedtime.   albuterol  (VENTOLIN  HFA) 108 (90 Base) MCG/ACT inhaler 18 g 2    Sig: Inhale 2 puffs into the lungs every 6 (six) hours as needed for  wheezing or shortness of breath.   pantoprazole  (PROTONIX ) 40 MG tablet 90 tablet 0    Sig: Take 1 tablet (40 mg total) by mouth daily.   doxycycline (VIBRA-TABS) 100 MG tablet 2 tablet 0    Sig: Take 2 tablets (200 mg total) by mouth once for 1 dose.    Return in about 3 months (around 03/20/2024) for Follow-Up or next available chronic conditions.  Senaida Dama, NP

## 2023-12-19 NOTE — Progress Notes (Signed)
 3 month follow up, pulled tick off left below buttock cheek and its looks infected

## 2023-12-20 ENCOUNTER — Ambulatory Visit: Payer: Self-pay | Admitting: Family

## 2023-12-20 LAB — LYME DISEASE SEROLOGY W/REFLEX: Lyme Total Antibody EIA: NEGATIVE

## 2023-12-22 ENCOUNTER — Telehealth: Payer: Self-pay

## 2023-12-22 DIAGNOSIS — R911 Solitary pulmonary nodule: Secondary | ICD-10-CM

## 2023-12-22 NOTE — Telephone Encounter (Signed)
 Call Report from Tiffany:  IMPRESSION: 1. Lung-RADS 4A, suspicious. Follow up low-dose chest CT without contrast in 3 months (please use the following order, "CT CHEST LCS NODULE FOLLOW-UP W/O CM") is recommended. Ill-defined subpleural anterior right upper lobe 10.5 mm solid pulmonary nodule, not definitely seen on 05/30/2023 CT, although comparison is limited by motion degradation and hypoinspiration on the prior CT. 2. Two-vessel coronary atherosclerosis. 3. 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 4. Left colonic diverticulosis. 5. Aortic Atherosclerosis (ICD10-I70.0) and Emphysema (ICD10-J43.9).

## 2023-12-25 NOTE — Telephone Encounter (Signed)
 Copied from CRM [id number]. Topic: Clinical - Lab/Test Results >> Dec 25, 2023  9:19 AM Keith Gibbs wrote: Reason for CRM:   Pt is contacting clinic to see if LCS screening results have been received. Reviewed cahrt and advised the results have come back.   Pt requesting call back to review results.  CB#  [phone]  Please advise

## 2023-12-27 NOTE — Telephone Encounter (Signed)
 Dara Ear NP has reviewed the scan. Due to the new nodule, 10.82mm, since CT angio in 05/2023 it has been advised the pt do a 3 month follow up scan to re-evaluate. Called and spoke with pt. Informed him of the recs per Isa Manuel. Patient verbalized understanding and is agreeable to 3 month follow up scan, order placed. Results have been forwarded to PCP with plan.

## 2023-12-27 NOTE — Telephone Encounter (Signed)
 Patient called in again requesting results. We reviewed his scan and discussed the suspicious 10.13mm nodule that was noted during this scan while not present at CT angio in 05/2023. Patient is aware this is a concerning finding but will review with Dara Ear NP to discuss next steps.

## 2023-12-27 NOTE — Addendum Note (Signed)
 Addended by: Veryl Gottron on: 12/27/2023 04:27 PM   Modules accepted: Orders

## 2024-01-01 ENCOUNTER — Encounter: Payer: Self-pay | Admitting: Cardiovascular Disease

## 2024-01-01 ENCOUNTER — Ambulatory Visit (INDEPENDENT_AMBULATORY_CARE_PROVIDER_SITE_OTHER): Payer: MEDICAID | Admitting: Cardiovascular Disease

## 2024-01-01 VITALS — BP 98/68 | HR 54 | Ht 75.0 in | Wt 229.0 lb

## 2024-01-01 DIAGNOSIS — I340 Nonrheumatic mitral (valve) insufficiency: Secondary | ICD-10-CM

## 2024-01-01 DIAGNOSIS — I10 Essential (primary) hypertension: Secondary | ICD-10-CM

## 2024-01-01 DIAGNOSIS — I2510 Atherosclerotic heart disease of native coronary artery without angina pectoris: Secondary | ICD-10-CM

## 2024-01-01 DIAGNOSIS — R0789 Other chest pain: Secondary | ICD-10-CM | POA: Diagnosis not present

## 2024-01-01 DIAGNOSIS — Z9861 Coronary angioplasty status: Secondary | ICD-10-CM | POA: Diagnosis not present

## 2024-01-01 DIAGNOSIS — R0602 Shortness of breath: Secondary | ICD-10-CM

## 2024-01-01 DIAGNOSIS — F1020 Alcohol dependence, uncomplicated: Secondary | ICD-10-CM

## 2024-01-01 DIAGNOSIS — I351 Nonrheumatic aortic (valve) insufficiency: Secondary | ICD-10-CM

## 2024-01-01 MED ORDER — ISOSORBIDE MONONITRATE ER 30 MG PO TB24: 30.0000 mg | ORAL_TABLET | Freq: Every day | ORAL | 1 refills | Status: DC

## 2024-01-01 NOTE — Progress Notes (Signed)
 Cardiology Office Note   Date:  01/01/2024   ID:  Keith Gibbs, DOB 03-03-1961, MRN [id number]  PCP:  Senaida Dama, NP  Cardiologist:  Debborah Fairly, MD      History of Present Illness: Keith Gibbs is a 63 y.o. male who presents for  Chief Complaint  Patient presents with   Follow-up    3 month follow up    Has chest pain intermittantly      Past Medical History:  Diagnosis Date   Anginal pain (HCC)    Anxiety    Arthritis    Bipolar 1 disorder (HCC)    Bursitis    CAD (coronary artery disease)    Chronic pain    COPD (chronic obstructive pulmonary disease) (HCC)    Current use of long term anticoagulation    DAPT (ASA + clopidogrel )   Depression    Diverticulitis    Dyspnea    GERD (gastroesophageal reflux disease)    Grade I diastolic dysfunction    Hepatitis C 2012   No longer has Hep C   HLD (hyperlipidemia)    Hypertension    MI (myocardial infarction) (HCC)    Polysubstance abuse (HCC)    cocaine, marijuana, ETOH   PUD (peptic ulcer disease)    S/P angioplasty with stent 06/10/2016   a.) 90% stenosis of pLAD to mLAD - 2.5 x 18 mm Xience Alpine (DES x 1) placed to pLAD   S/P PTCA (percutaneous transluminal coronary angioplasty) 12/04/2019   a.) 60% in stent restenosis of DES to pLAD; LVEF 65%.   Schizophrenia (HCC)    Stroke [facility])    Valvular insufficiency    a.) Mild MR, TR, PR; mild to moderate AR on 03/05/2018 TTE     Past Surgical History:  Procedure Laterality Date   ABDOMINAL SURGERY     removed small piece of intestines due to [facility] Diverticulosis   ANTERIOR LAT LUMBAR FUSION N/A 08/23/2022   Procedure: DIRECT LATERAL INTERBODY FUSION  LUMBAR TWO- LUMBAR THREE, LUMBAR THREE-LUMBAR FOUR , EXPLORE AND EXTEND FUSION LUMBAR TWO-LUMBAR FIVE, POSTERIOR DECOMPRESSION LUMBAR THREE-LUMBAR FOUR, LEFT LUMBAR TWO-LUMBAR THREE;  Surgeon: Van Gelinas, MD;  Location: [facility] OR;  Service: Neurosurgery;  Laterality: N/A;   APPENDECTOMY      BACK SURGERY     CARDIAC CATHETERIZATION Left 06/10/2016   Procedure: Left Heart Cath and Coronary Angiography;  Surgeon: Cherrie Cornwall, MD;  Location: [facility] INVASIVE CV LAB;  Service: Cardiovascular;  Laterality: Left;   CARDIAC CATHETERIZATION N/A 06/10/2016   Procedure: Coronary Stent Intervention;  Surgeon: Antonette Batters, MD;  Location: [facility] INVASIVE CV LAB;  Service: Cardiovascular;  Laterality: N/A;   CHOLECYSTECTOMY N/A 02/17/2022   Procedure: LAPAROSCOPIC CHOLECYSTECTOMY;  Surgeon: Dorrie Gaudier Alphonso Aschoff, MD;  Location: [facility] OR;  Service: General;  Laterality: N/A;   COLON SURGERY     COLONOSCOPY     COLONOSCOPY WITH PROPOFOL  N/A 01/05/2017   Procedure: COLONOSCOPY WITH PROPOFOL ;  Surgeon: Luke Salaam, MD;  Location: [facility] ENDOSCOPY;  Service: Endoscopy;  Laterality: N/A;   COLONOSCOPY WITH PROPOFOL  N/A 02/13/2020   Procedure: COLONOSCOPY WITH PROPOFOL ;  Surgeon: Irby Mannan, MD;  Location: [facility] ENDOSCOPY;  Service: Endoscopy;  Laterality: N/A;   CORONARY ANGIOPLASTY WITH STENT PLACEMENT     CORONARY PRESSURE/FFR STUDY N/A 12/04/2019   Procedure: INTRAVASCULAR PRESSURE WIRE/FFR STUDY;  Surgeon: Arnoldo Lapping, MD;  Location: [facility] INVASIVE CV LAB;  Service: Cardiovascular;  Laterality: N/A;   ESOPHAGOGASTRODUODENOSCOPY (EGD) WITH  PROPOFOL  N/A 01/05/2017   Procedure: ESOPHAGOGASTRODUODENOSCOPY (EGD) WITH PROPOFOL ;  Surgeon: Luke Salaam, MD;  Location: [facility] ENDOSCOPY;  Service: Endoscopy;  Laterality: N/A;   ESOPHAGOGASTRODUODENOSCOPY (EGD) WITH PROPOFOL  N/A 02/13/2020   Procedure: ESOPHAGOGASTRODUODENOSCOPY (EGD) WITH PROPOFOL ;  Surgeon: Irby Mannan, MD;  Location: [facility] ENDOSCOPY;  Service: Endoscopy;  Laterality: N/A;   KNEE ARTHROSCOPY WITH MEDIAL MENISECTOMY Right 09/05/2017   Procedure: KNEE ARTHROSCOPY WITH MEDIAL AND LATERAL  MENISECTOMY PARTIAL SYNOVECTOMY;  Surgeon: Molli Angelucci, MD;  Location: [facility] ORS;  Service: Orthopedics;  Laterality: Right;   LEFT HEART CATH  AND CORONARY ANGIOGRAPHY N/A 12/04/2019   Procedure: LEFT HEART CATH AND CORONARY ANGIOGRAPHY;  Surgeon: Arnoldo Lapping, MD;  Location: [facility] INVASIVE CV LAB;  Service: Cardiovascular;  Laterality: N/A;   LEFT HEART CATH AND CORONARY ANGIOGRAPHY Left 11/25/2022   Procedure: LEFT HEART CATH AND CORONARY ANGIOGRAPHY;  Surgeon: Cherrie Cornwall, MD;  Location: [facility] INVASIVE CV LAB;  Service: Cardiovascular;  Laterality: Left;   REVERSE SHOULDER ARTHROPLASTY Right 08/31/2023   Procedure: RIGHT REVERSE SHOULDER ARTHROPLASTY;  Surgeon: Jasmine Mesi, MD;  Location: [facility] OR;  Service: Orthopedics;  Laterality: Right;   SHOULDER SURGERY Right 04/09/2012   SPINE SURGERY     TOTAL KNEE ARTHROPLASTY Right 01/19/2021   Procedure: TOTAL KNEE ARTHROPLASTY - Thomos Flies to Assist;  Surgeon: Molli Angelucci, MD;  Location: [facility] ORS;  Service: Orthopedics;  Laterality: Right;     Current Outpatient Medications  Medication Sig Dispense Refill   isosorbide  mononitrate (IMDUR ) 30 MG 24 hr tablet Take 1 tablet (30 mg total) by mouth daily. 30 tablet 1   acetaminophen  (TYLENOL ) 325 MG tablet Take 1 tablet (325 mg total) by mouth every 6 (six) hours as needed for mild pain (pain score 1-3) (or temp > 100.5). 60 tablet 0   albuterol  (VENTOLIN  HFA) 108 (90 Base) MCG/ACT inhaler Inhale 2 puffs into the lungs every 6 (six) hours as needed for wheezing or shortness of breath. 18 g 2   aspirin  EC 81 MG tablet Take 1 tablet (81 mg total) by mouth daily. Swallow whole. 30 tablet 0   atorvastatin  (LIPITOR ) 80 MG tablet Take 1 tablet (80 mg total) by mouth daily. 30 tablet 0   budesonide -glycopyrrolate -formoterol  (BREZTRI  AEROSPHERE) 160-9-4.8 MCG/ACT AERO inhaler Inhale 2 puffs into the lungs in the morning and at bedtime. 1 each 2   celecoxib  (CELEBREX ) 200 MG capsule TAKE 1 CAPSULE BY MOUTH TWICE A DAY 40 capsule 0   divalproex  (DEPAKOTE ) 500 MG DR tablet Take half tablet in the morning and 3 tablets at bedtime 105 tablet 3    docusate sodium  (COLACE) 100 MG capsule Take 1 capsule (100 mg total) by mouth 2 (two) times daily. (Patient not taking: Reported on 12/19/2023) 10 capsule 0   DULoxetine  (CYMBALTA ) 30 MG capsule Take 3 capsules (90 mg total) by mouth daily. 270 capsule 0   gabapentin  (NEURONTIN ) 600 MG tablet Take 1 tablet (600 mg total) by mouth daily AND 2 tablets (1,200 mg total) at bedtime. 90 tablet 1   hydrOXYzine  (ATARAX ) 25 MG tablet Take 1 tablet (25 mg total) by mouth 2 (two) times daily as needed. 270 tablet 0   nitroGLYCERIN  (NITROSTAT ) 0.4 MG SL tablet Place 1 tablet (0.4 mg total) under the tongue every 5 (five) minutes as needed for chest pain. 30 tablet 0   oxyCODONE  (OXY IR/ROXICODONE ) 5 MG immediate release tablet Take 1 tablet (5 mg total) by mouth every 12 (twelve) hours as needed for moderate  pain (pain score 4-6) (pain score 4-6). (Patient not taking: Reported on 12/19/2023) 30 tablet 0   oxyCODONE  (OXY IR/ROXICODONE ) 5 MG immediate release tablet Take 1 tablet (5 mg total) by mouth every 12 (twelve) hours as needed for severe pain (pain score 7-10). (Patient not taking: Reported on 12/19/2023) 25 tablet 0   pantoprazole  (PROTONIX ) 40 MG tablet Take 1 tablet (40 mg total) by mouth daily. 90 tablet 0   QUEtiapine  (SEROQUEL ) 200 MG tablet Take 1 tablet (200 mg total) by mouth at bedtime. 90 tablet 0   traZODone  (DESYREL ) 100 MG tablet Take 1 tablet (100 mg total) by mouth at bedtime. 90 tablet 0   No current facility-administered medications for this visit.    Allergies:   Asenapine, Dextromethorphan hbr, Guaifenesin , Latuda [lurasidone hcl], Lurasidone, and Phenylephrine     Social History:   reports that he quit smoking about 25 years ago. His smoking use included cigarettes. He started smoking about 45 years ago. He has a 63.3 pack-year smoking history. He has been exposed to tobacco smoke. He has never used smokeless tobacco. He reports that he does not currently use alcohol  after a past usage  of about 3.0 standard drinks of alcohol  per week. He reports that he does not currently use drugs after having used the following drugs: Cocaine and Marijuana.   Family History:  family history includes Depression in his mother; Early death in his father; Heart attack in his father; Heart disease in his father and mother; Hypertension in his father, mother, and sister; Osteoarthritis in his mother; Parkinson's disease in his maternal grandfather.    ROS:     Review of Systems  Constitutional: Negative.   HENT: Negative.    Eyes: Negative.   Respiratory: Negative.    Gastrointestinal: Negative.   Genitourinary: Negative.   Musculoskeletal: Negative.   Skin: Negative.   Neurological: Negative.   Endo/Heme/Allergies: Negative.   Psychiatric/Behavioral: Negative.    All other systems reviewed and are negative.     All other systems are reviewed and negative.    PHYSICAL EXAM: VS:  BP 98/68   Pulse (!) 54   Ht 6\' 3"  (1.905 m)   Wt 229 lb (103.9 kg)   SpO2 95%   BMI 28.62 kg/m  , BMI Body mass index is 28.62 kg/m. Last weight:  Wt Readings from Last 3 Encounters:  01/01/24 229 lb (103.9 kg)  12/19/23 223 lb (101.2 kg)  09/28/23 229 lb (103.9 kg)     Physical Exam Vitals reviewed.  Constitutional:      Appearance: Normal appearance. He is normal weight.  HENT:     Head: Normocephalic.     Nose: Nose normal.     Mouth/Throat:     Mouth: Mucous membranes are moist.  Eyes:     Pupils: Pupils are equal, round, and reactive to light.  Cardiovascular:     Rate and Rhythm: Normal rate and regular rhythm.     Pulses: Normal pulses.     Heart sounds: Normal heart sounds.  Pulmonary:     Effort: Pulmonary effort is normal.  Abdominal:     General: Abdomen is flat. Bowel sounds are normal.  Musculoskeletal:        General: Normal range of motion.     Cervical back: Normal range of motion.  Skin:    General: Skin is warm.  Neurological:     General: No focal deficit  present.     Mental Status: He is alert.  Psychiatric:        Mood and Affect: Mood normal.       EKG:   Recent Labs: 02/13/2023: TSH 1.970 05/30/2023: B Natriuretic Peptide 34.3; Magnesium  1.6 08/24/2023: Hemoglobin 15.6; Platelets 165 11/15/2023: ALT 6; BUN 11; Creatinine, Ser 0.94; Potassium 4.9; Sodium 138    Lipid Panel    Component Value Date/Time   CHOL 83 04/26/2022 1530   CHOL 92 (L) 08/31/2021 1408   TRIG 52 04/26/2022 1530   HDL 28 (L) 04/26/2022 1530   HDL 33 (L) 08/31/2021 1408   CHOLHDL 3.0 04/26/2022 1530   VLDL 10 04/26/2022 1530   LDLCALC 45 04/26/2022 1530   LDLCALC 41 08/31/2021 1408      Other studies Reviewed: Additional studies/ records that were reviewed today include:  Review of the above records demonstrates:       No data to display            ASSESSMENT AND PLAN:    ICD-10-CM   1. CAD S/P percutaneous coronary angioplasty  I25.10 isosorbide  mononitrate (IMDUR ) 30 MG 24 hr tablet   Z98.61 PCV ECHOCARDIOGRAM COMPLETE    MYOCARDIAL PERFUSION IMAGING    2. Essential hypertension  I10 isosorbide  mononitrate (IMDUR ) 30 MG 24 hr tablet    PCV ECHOCARDIOGRAM COMPLETE    MYOCARDIAL PERFUSION IMAGING    3. Other chest pain  R07.89 isosorbide  mononitrate (IMDUR ) 30 MG 24 hr tablet    PCV ECHOCARDIOGRAM COMPLETE    MYOCARDIAL PERFUSION IMAGING   recurrent chest pain with h/o PCI, will do echo, stress test, add imdur .    4. Nonrheumatic mitral valve regurgitation  I34.0 isosorbide  mononitrate (IMDUR ) 30 MG 24 hr tablet    PCV ECHOCARDIOGRAM COMPLETE    MYOCARDIAL PERFUSION IMAGING    5. Nonrheumatic aortic valve insufficiency  I35.1 isosorbide  mononitrate (IMDUR ) 30 MG 24 hr tablet    PCV ECHOCARDIOGRAM COMPLETE    MYOCARDIAL PERFUSION IMAGING    6. SOB (shortness of breath)  R06.02 isosorbide  mononitrate (IMDUR ) 30 MG 24 hr tablet    PCV ECHOCARDIOGRAM COMPLETE    MYOCARDIAL PERFUSION IMAGING   Has on ct 10.3 cm nodule on ct, advise  quiting smoking    7. Alcohol  use disorder, moderate, dependence (HCC)  F10.20 isosorbide  mononitrate (IMDUR ) 30 MG 24 hr tablet    PCV ECHOCARDIOGRAM COMPLETE    MYOCARDIAL PERFUSION IMAGING       Problem List Items Addressed This Visit       Cardiovascular and Mediastinum   CAD S/P percutaneous coronary angioplasty - Primary (Chronic)   Relevant Medications   isosorbide  mononitrate (IMDUR ) 30 MG 24 hr tablet   Other Relevant Orders   PCV ECHOCARDIOGRAM COMPLETE   MYOCARDIAL PERFUSION IMAGING   Essential hypertension   Relevant Medications   isosorbide  mononitrate (IMDUR ) 30 MG 24 hr tablet   Other Relevant Orders   PCV ECHOCARDIOGRAM COMPLETE   MYOCARDIAL PERFUSION IMAGING     Other   Chest pain   Relevant Medications   isosorbide  mononitrate (IMDUR ) 30 MG 24 hr tablet   Other Relevant Orders   PCV ECHOCARDIOGRAM COMPLETE   MYOCARDIAL PERFUSION IMAGING   Alcohol  use disorder, moderate, dependence (HCC)   Relevant Medications   isosorbide  mononitrate (IMDUR ) 30 MG 24 hr tablet   Other Relevant Orders   PCV ECHOCARDIOGRAM COMPLETE   MYOCARDIAL PERFUSION IMAGING   Other Visit Diagnoses       Nonrheumatic mitral valve regurgitation       Relevant Medications  isosorbide  mononitrate (IMDUR ) 30 MG 24 hr tablet   Other Relevant Orders   PCV ECHOCARDIOGRAM COMPLETE   MYOCARDIAL PERFUSION IMAGING     Nonrheumatic aortic valve insufficiency       Relevant Medications   isosorbide  mononitrate (IMDUR ) 30 MG 24 hr tablet   Other Relevant Orders   PCV ECHOCARDIOGRAM COMPLETE   MYOCARDIAL PERFUSION IMAGING     SOB (shortness of breath)       Has on ct 10.3 cm nodule on ct, advise quiting smoking   Relevant Medications   isosorbide  mononitrate (IMDUR ) 30 MG 24 hr tablet   Other Relevant Orders   PCV ECHOCARDIOGRAM COMPLETE   MYOCARDIAL PERFUSION IMAGING          Disposition:   Return in about 2 weeks (around 01/15/2024) for echo, stress test and f/u.    Total  time spent: 30 minutes  Signed,  Debborah Fairly, MD  01/01/2024 10:12 AM    [facility]

## 2024-01-03 ENCOUNTER — Other Ambulatory Visit: Payer: Self-pay

## 2024-01-03 ENCOUNTER — Telehealth: Payer: Self-pay

## 2024-01-03 DIAGNOSIS — M79601 Pain in right arm: Secondary | ICD-10-CM

## 2024-01-03 DIAGNOSIS — M5412 Radiculopathy, cervical region: Secondary | ICD-10-CM

## 2024-01-03 NOTE — Telephone Encounter (Signed)
 Mychart message sent to patient.

## 2024-01-05 ENCOUNTER — Other Ambulatory Visit: Payer: Self-pay | Admitting: Surgical

## 2024-01-13 ENCOUNTER — Other Ambulatory Visit: Payer: Self-pay | Admitting: Internal Medicine

## 2024-01-13 DIAGNOSIS — E782 Mixed hyperlipidemia: Secondary | ICD-10-CM

## 2024-01-15 ENCOUNTER — Other Ambulatory Visit: Payer: MEDICAID

## 2024-01-18 ENCOUNTER — Ambulatory Visit: Payer: MEDICAID | Admitting: Cardiovascular Disease

## 2024-01-19 ENCOUNTER — Ambulatory Visit: Payer: MEDICAID | Admitting: Cardiovascular Disease

## 2024-01-20 ENCOUNTER — Other Ambulatory Visit: Payer: Self-pay | Admitting: Surgical

## 2024-01-24 ENCOUNTER — Other Ambulatory Visit: Payer: Self-pay | Admitting: Cardiovascular Disease

## 2024-01-24 DIAGNOSIS — I340 Nonrheumatic mitral (valve) insufficiency: Secondary | ICD-10-CM

## 2024-01-24 DIAGNOSIS — F1020 Alcohol dependence, uncomplicated: Secondary | ICD-10-CM

## 2024-01-24 DIAGNOSIS — I2510 Atherosclerotic heart disease of native coronary artery without angina pectoris: Secondary | ICD-10-CM

## 2024-01-24 DIAGNOSIS — R0602 Shortness of breath: Secondary | ICD-10-CM

## 2024-01-24 DIAGNOSIS — I351 Nonrheumatic aortic (valve) insufficiency: Secondary | ICD-10-CM

## 2024-01-24 DIAGNOSIS — R0789 Other chest pain: Secondary | ICD-10-CM

## 2024-01-24 DIAGNOSIS — I10 Essential (primary) hypertension: Secondary | ICD-10-CM

## 2024-01-29 ENCOUNTER — Ambulatory Visit (INDEPENDENT_AMBULATORY_CARE_PROVIDER_SITE_OTHER): Payer: MEDICAID

## 2024-01-29 DIAGNOSIS — R0789 Other chest pain: Secondary | ICD-10-CM

## 2024-01-29 DIAGNOSIS — Z9861 Coronary angioplasty status: Secondary | ICD-10-CM

## 2024-01-29 DIAGNOSIS — F1020 Alcohol dependence, uncomplicated: Secondary | ICD-10-CM

## 2024-01-29 DIAGNOSIS — I340 Nonrheumatic mitral (valve) insufficiency: Secondary | ICD-10-CM

## 2024-01-29 DIAGNOSIS — I10 Essential (primary) hypertension: Secondary | ICD-10-CM

## 2024-01-29 DIAGNOSIS — R0602 Shortness of breath: Secondary | ICD-10-CM

## 2024-01-29 DIAGNOSIS — I351 Nonrheumatic aortic (valve) insufficiency: Secondary | ICD-10-CM

## 2024-01-29 DIAGNOSIS — I2510 Atherosclerotic heart disease of native coronary artery without angina pectoris: Secondary | ICD-10-CM | POA: Diagnosis not present

## 2024-01-29 MED ORDER — TECHNETIUM TC 99M SESTAMIBI GENERIC - CARDIOLITE
30.0000 | Freq: Once | INTRAVENOUS | Status: AC | PRN
  Administered 2024-01-29: 30 via INTRAVENOUS

## 2024-01-29 MED ORDER — TECHNETIUM TC 99M SESTAMIBI GENERIC - CARDIOLITE
10.0000 | Freq: Once | INTRAVENOUS | Status: AC | PRN
  Administered 2024-01-29: 10 via INTRAVENOUS

## 2024-02-07 ENCOUNTER — Telehealth: Payer: Self-pay

## 2024-02-07 NOTE — Progress Notes (Unsigned)
 [facility] MD/PA/NP OP Progress Note  02/08/2024 11:33 AM Keith Gibbs  MRN:  [id number]  Assessment and Plan:  Patient seen today in person. He reports worsening anger in the context of tension with his mother who lives with him. He plans on buying a punching bag and an exercise machine to redirect his frustrations. He denies having physical outbursts but does report having increased irritability. He feels worthless and hopeless with his current situation of feeling stuck to live with his mother but also states he does not want to abandon her. He states wanting to start CDIOP to aid with his ongoing cocaine and alcohol  use. He has motivation to stop the substances at this time. He reports worsening paranoia as well regarding his neighbors spying on him. To aid with mood stabilization and paranoia, I will increase his Seroquel  to 300 mg. I also obtained labs to monitor for metabolic syndrome, reassuringly his prior A1c and lipid panel were unremarkable. Patient also voices having lung nodules with are currently being evaluated if they are cancerous and he states that he is at peace if they are cancerous. I will reassess patient in 5 weeks.  Schizoaffective disorder, bipolar type PTSD Chronic pain -Increase Seroquel  to 300 mg nightly  -labs: A1c 5.2 in 2023, lipid panel WNL, qtc 429 on 05/2023  - updated labs pending - Change Depakote  250mg  daily and 1500mg  at bedtime - Continue Cymbalta  90 mg daily - Continue gabapentin  600 mg daily and 1200 mg nightly - Continue trazodone  100 mg nightly - Continue hydroxyzine  25 mg 2 times daily as needed  --VPA level: 69  and CMP : WNL  Stimulant use d/o Etoh use d/o Cannabis use disorder, remission - Referral to CDIOP done  Tobacco use disorder - continue to monitor   Chief Complaint:  Chief Complaint  Patient presents with   Medication Refill   Depression   Anxiety   Schizophrenia   Identifying Information: Keith Gibbs is a 63 year old male  with a past psychiatric history of schizoaffective, cocaine use disorder, alcohol  use disorder; numerous hospitalizations due to substance intoxication/withdrawal, suicidal thoughts/attempts, mania, psychosis; numerous suicide attempts most recent required medical and psychiatric hospitalization in 07/2022 and a PMH of COPD, CVA, CAD, GERD, HTN, arthritis, and chronic pain.   Subjective: Patient seen alone.  Patient reports feeling upset and stuck today. Since the previous visit, he reports feeling worse regarding anger and paranoia. He states his neighbors turned the police on him. He states there was a drone that watches him. Regarding medications, patient notes benefit with the change in Depakote . Patient reports the following adverse effects: denies.   Patient states that he does not have friends or a future. He reports his lung scan showed lung nodules and he feels at peace if the nodules are cancerous. He can't draw and paint because of his tremors. He has chronic pain so he has difficulty being active. He reports enjoying gaming like Xbox 360 and PS4. He plans on exercising. He also plans to stop smoking and to buy patches. He reports meditating in the mornings and praying when he feels stressed. He is agreeable to starting therapy.   Patient denies current SI, HI, and AVH. He feels his mother and sister conspiring against him.   Stressors include tension with his mother because she does not want him to drink and with finances. He feels stuck to live with his mother due to financial strain. He reports paying his mother $31 monthly for her debt.  Visit Diagnosis:    ICD-10-CM   1. Encounter for medication monitoring  Z51.81 HgB A1c    Lipid Profile    2. Alcohol  use disorder, mild, in early remission  F10.11 divalproex  (DEPAKOTE ) 500 MG DR tablet    DULoxetine  (CYMBALTA ) 30 MG capsule    3. Schizoaffective disorder, bipolar type (HCC)  F25.0 divalproex  (DEPAKOTE ) 500 MG DR tablet     DULoxetine  (CYMBALTA ) 30 MG capsule    gabapentin  (NEURONTIN ) 600 MG tablet    hydrOXYzine  (ATARAX ) 25 MG tablet    QUEtiapine  (SEROQUEL ) 200 MG tablet    4. Cocaine use disorder, mild, in early remission, abuse (HCC)  F14.11 DULoxetine  (CYMBALTA ) 30 MG capsule    5. PTSD (post-traumatic stress disorder)  F43.10 DULoxetine  (CYMBALTA ) 30 MG capsule    gabapentin  (NEURONTIN ) 600 MG tablet    traZODone  (DESYREL ) 100 MG tablet    6. Opioid use disorder, mild, in early remission, abuse (HCC)  F11.11 gabapentin  (NEURONTIN ) 600 MG tablet    7. Generalized anxiety disorder  F41.1 hydrOXYzine  (ATARAX ) 25 MG tablet       Past Psychiatric History: Patient endorses being hospitalized 3 times.  Patient reports the first time he was hospitalized in his 30s and the last time was a 1-2 years ago at [facility].  Reports that he was at [facility] he stayed approximately 30 days.  Patient reports that all 3 hospitalizations were due to suicide attempts.  Patient reports he has tried to hang himself (the belt broke), slit his wrist, OD.   Substance use:  Cocaine- since 22 years ago most recently one week, frequency - once every 3 months  Alcohol - once/3 weeks- two 40 oz beers at a time Smoking- now smoking 1 ppd  Past Medical History:  Past Medical History:  Diagnosis Date   Anginal pain (HCC)    Anxiety    Arthritis    Bipolar 1 disorder (HCC)    Bursitis    CAD (coronary artery disease)    Chronic pain    COPD (chronic obstructive pulmonary disease) (HCC)    Current use of long term anticoagulation    DAPT (ASA + clopidogrel )   Depression    Diverticulitis    Dyspnea    GERD (gastroesophageal reflux disease)    Grade I diastolic dysfunction    Hepatitis C 2012   No longer has Hep C   HLD (hyperlipidemia)    Hypertension    MI (myocardial infarction) (HCC)    Polysubstance abuse (HCC)    cocaine, marijuana, ETOH   PUD (peptic ulcer disease)    S/P angioplasty with stent 06/10/2016   a.) 90%  stenosis of pLAD to mLAD - 2.5 x 18 mm Xience Alpine (DES x 1) placed to pLAD   S/P PTCA (percutaneous transluminal coronary angioplasty) 12/04/2019   a.) 60% in stent restenosis of DES to pLAD; LVEF 65%.   Schizophrenia (HCC)    Stroke [facility])    Valvular insufficiency    a.) Mild MR, TR, PR; mild to moderate AR on 03/05/2018 TTE    Past Surgical History:  Procedure Laterality Date   ABDOMINAL SURGERY     removed small piece of intestines due to [facility] Diverticulosis   ANTERIOR LAT LUMBAR FUSION N/A 08/23/2022   Procedure: DIRECT LATERAL INTERBODY FUSION  LUMBAR TWO- LUMBAR THREE, LUMBAR THREE-LUMBAR FOUR , EXPLORE AND EXTEND FUSION LUMBAR TWO-LUMBAR FIVE, POSTERIOR DECOMPRESSION LUMBAR THREE-LUMBAR FOUR, LEFT LUMBAR TWO-LUMBAR THREE;  Surgeon: Debby Dorn MATSU, MD;  Location: [facility] OR;  Service: Neurosurgery;  Laterality: N/A;   APPENDECTOMY     BACK SURGERY     CARDIAC CATHETERIZATION Left 06/10/2016   Procedure: Left Heart Cath and Coronary Angiography;  Surgeon: Denyse DELENA Bathe, MD;  Location: [facility] INVASIVE CV LAB;  Service: Cardiovascular;  Laterality: Left;   CARDIAC CATHETERIZATION N/A 06/10/2016   Procedure: Coronary Stent Intervention;  Surgeon: Cara JONETTA Lovelace, MD;  Location: [facility] INVASIVE CV LAB;  Service: Cardiovascular;  Laterality: N/A;   CHOLECYSTECTOMY N/A 02/17/2022   Procedure: LAPAROSCOPIC CHOLECYSTECTOMY;  Surgeon: Stevie Herlene Righter, MD;  Location: [facility] OR;  Service: General;  Laterality: N/A;   COLON SURGERY     COLONOSCOPY     COLONOSCOPY WITH PROPOFOL  N/A 01/05/2017   Procedure: COLONOSCOPY WITH PROPOFOL ;  Surgeon: Therisa Bi, MD;  Location: [facility] ENDOSCOPY;  Service: Endoscopy;  Laterality: N/A;   COLONOSCOPY WITH PROPOFOL  N/A 02/13/2020   Procedure: COLONOSCOPY WITH PROPOFOL ;  Surgeon: Janalyn Keene NOVAK, MD;  Location: [facility] ENDOSCOPY;  Service: Endoscopy;  Laterality: N/A;   CORONARY ANGIOPLASTY WITH STENT PLACEMENT     CORONARY PRESSURE/FFR STUDY N/A  12/04/2019   Procedure: INTRAVASCULAR PRESSURE WIRE/FFR STUDY;  Surgeon: Wonda Sharper, MD;  Location[facility] INVASIVE CV LAB;  Service: Cardiovascular;  Laterality: N/A;   ESOPHAGOGASTRODUODENOSCOPY (EGD) WITH PROPOFOL  N/A 01/05/2017   Procedure: ESOPHAGOGASTRODUODENOSCOPY (EGD) WITH PROPOFOL ;  Surgeon: Therisa Bi, MD;  Location: [facility] ENDOSCOPY;  Service: Endoscopy;  Laterality: N/A;   ESOPHAGOGASTRODUODENOSCOPY (EGD) WITH PROPOFOL  N/A 02/13/2020   Procedure: ESOPHAGOGASTRODUODENOSCOPY (EGD) WITH PROPOFOL ;  Surgeon: Janalyn Keene NOVAK, MD;  Location: [facility] ENDOSCOPY;  Service: Endoscopy;  Laterality: N/A;   KNEE ARTHROSCOPY WITH MEDIAL MENISECTOMY Right 09/05/2017   Procedure: KNEE ARTHROSCOPY WITH MEDIAL AND LATERAL  MENISECTOMY PARTIAL SYNOVECTOMY;  Surgeon: Kathlynn Sharper, MD;  Location: [facility] ORS;  Service: Orthopedics;  Laterality: Right;   LEFT HEART CATH AND CORONARY ANGIOGRAPHY N/A 12/04/2019   Procedure: LEFT HEART CATH AND CORONARY ANGIOGRAPHY;  Surgeon: Wonda Sharper, MD;  Location: [facility] INVASIVE CV LAB;  Service: Cardiovascular;  Laterality: N/A;   LEFT HEART CATH AND CORONARY ANGIOGRAPHY Left 11/25/2022   Procedure: LEFT HEART CATH AND CORONARY ANGIOGRAPHY;  Surgeon: Bathe Denyse DELENA, MD;  Location: [facility] INVASIVE CV LAB;  Service: Cardiovascular;  Laterality: Left;   REVERSE SHOULDER ARTHROPLASTY Right 08/31/2023   Procedure: RIGHT REVERSE SHOULDER ARTHROPLASTY;  Surgeon: [name], MD;  Location: [facility] OR;  Service: Orthopedics;  Laterality: Right;   SHOULDER SURGERY Right 04/09/2012   SPINE SURGERY     TOTAL KNEE ARTHROPLASTY Right 01/19/2021   Procedure: TOTAL KNEE ARTHROPLASTY - Medford Amber to Assist;  Surgeon: Kathlynn Sharper, MD;  Location: [facility] ORS;  Service: Orthopedics;  Laterality: Right;    Family Psychiatric History: Sister: Depression, paternal grandma-depression and anxiety, multiple maternal family members: EtOH use disorder, paternal aunt: schizophrenia was  hospitalized, another paternal aunt: depression and anxiety   Family History:  Family History  Problem Relation Age of Onset   Osteoarthritis Mother    Heart disease Mother    Hypertension Mother    Depression Mother    Heart disease Father    Early death Father    Hypertension Father    Heart attack Father    Hypertension Sister    Parkinson's disease Maternal Grandfather    Prostate cancer Neg Hx    Bladder Cancer Neg Hx    Kidney cancer Neg Hx    Tremor Neg Hx     Social History:  Living: live with mother in [address] Occupation:  retired Relationship: widowed Children: yes 3 - ages 2 and 84 Support: religion Legal History: numerous times in prison- decades ago  Social History   Socioeconomic History   Marital status: Widowed    Spouse name: Not on file   Number of children: Not on file   Years of education: Not on file   Highest education level: GED or equivalent  Occupational History   Occupation: disability   Occupation: stocking at gas station    Comment: 2 days per week  Tobacco Use   Smoking status: Former    Current packs/day: 1.50    Average packs/day: 1.5 packs/day for 42.3 years (63.4 ttl pk-yrs)    Types: Cigarettes    Start date: 10/26/1978    Quit date: 2000    Passive exposure: Current   Smokeless tobacco: Never  Vaping Use   Vaping status: Never Used  Substance and Sexual Activity   Alcohol  use: Not Currently    Alcohol /week: 3.0 standard drinks of alcohol     Types: 3 Cans of beer per week    Comment: occassionally    Drug use: Not Currently    Types: Cocaine, Marijuana    Comment: last on saturday   Sexual activity: Not Currently    Partners: Female    Birth control/protection: None  Other Topics Concern   Not on file  Social History Narrative   Not on file   Social Drivers of Health   Financial Resource Strain: High Risk (09/19/2023)   Overall Financial Resource Strain (CARDIA)    Difficulty of Paying Living Expenses: Hard  Food  Insecurity: Food Insecurity Present (09/19/2023)   Hunger Vital Sign    Worried About Running Out of Food in the Last Year: Often true    Ran Out of Food in the Last Year: Sometimes true  Transportation Needs: No Transportation Needs (09/19/2023)   PRAPARE - Administrator, Civil Service (Medical): No    Lack of Transportation (Non-Medical): No  Physical Activity: Unknown (09/19/2023)   Exercise Vital Sign    Days of Exercise per Week: 0 days    Minutes of Exercise per Session: Not on file  Stress: Stress Concern Present (09/19/2023)   [employer] of Occupational Health - Occupational Stress Questionnaire    Feeling of Stress : Rather much  Social Connections: Moderately Isolated (09/19/2023)   Social Connection and Isolation Panel    Frequency of Communication with Friends and Family: More than three times a week    Frequency of Social Gatherings with Friends and Family: More than three times a week    Attends Religious Services: More than 4 times per year    Active Member of [employer] or Organizations: No    Attends Banker Meetings: Not on file    Marital Status: Widowed    Allergies:  Allergies  Allergen Reactions   Asenapine Other (See Comments) and Nausea And Vomiting    Increased tremors   Dextromethorphan Hbr    Guaifenesin     Latuda [Lurasidone Hcl] Other (See Comments)    Tremors     Lurasidone Other (See Comments) and Hives    Other Reaction(s): Angioedema   Phenylephrine      Metabolic Disorder Labs: Lab Results  Component Value Date   HGBA1C 5.2 04/26/2022   MPG 102.54 04/26/2022   MPG 103 12/28/2016   No results found for: PROLACTIN Lab Results  Component Value Date   CHOL 83 04/26/2022   TRIG 52 04/26/2022   HDL  28 (L) 04/26/2022   CHOLHDL 3.0 04/26/2022   VLDL 10 04/26/2022   LDLCALC 45 04/26/2022   LDLCALC 41 08/31/2021   Lab Results  Component Value Date   TSH 1.970 02/13/2023   TSH 0.420 08/03/2022     Therapeutic Level Labs: No results found for: LITHIUM Lab Results  Component Value Date   VALPROATE 69 11/15/2023   VALPROATE 51 06/28/2023   No results found for: CBMZ  Current Medications: Current Outpatient Medications  Medication Sig Dispense Refill   acetaminophen  (TYLENOL ) 325 MG tablet Take 1 tablet (325 mg total) by mouth every 6 (six) hours as needed for mild pain (pain score 1-3) (or temp > 100.5). 60 tablet 0   albuterol  (VENTOLIN  HFA) 108 (90 Base) MCG/ACT inhaler Inhale 2 puffs into the lungs every 6 (six) hours as needed for wheezing or shortness of breath. 18 g 2   aspirin  EC 81 MG tablet Take 1 tablet (81 mg total) by mouth daily. Swallow whole. 30 tablet 0   atorvastatin  (LIPITOR ) 80 MG tablet TAKE 1 TABLET BY MOUTH EVERY DAY 90 tablet 3   budesonide -glycopyrrolate -formoterol  (BREZTRI  AEROSPHERE) 160-9-4.8 MCG/ACT AERO inhaler Inhale 2 puffs into the lungs in the morning and at bedtime. 1 each 2   celecoxib  (CELEBREX ) 100 MG capsule Take 2 capsules (200 mg total) by mouth 2 (two) times daily. 60 capsule 1   divalproex  (DEPAKOTE ) 500 MG DR tablet Take half tablet in the morning and 3 tablets at bedtime 105 tablet 2   docusate sodium  (COLACE) 100 MG capsule Take 1 capsule (100 mg total) by mouth 2 (two) times daily. (Patient not taking: Reported on 12/19/2023) 10 capsule 0   DULoxetine  (CYMBALTA ) 30 MG capsule Take 3 capsules (90 mg total) by mouth daily. 270 capsule 0   gabapentin  (NEURONTIN ) 600 MG tablet Take 1 tablet (600 mg total) by mouth daily AND 2 tablets (1,200 mg total) at bedtime. 90 tablet 1   hydrOXYzine  (ATARAX ) 25 MG tablet Take 1 tablet (25 mg total) by mouth 2 (two) times daily as needed. 270 tablet 0   isosorbide  mononitrate (IMDUR ) 30 MG 24 hr tablet TAKE 1 TABLET BY MOUTH EVERY DAY 90 tablet 1   nitroGLYCERIN  (NITROSTAT ) 0.4 MG SL tablet Place 1 tablet (0.4 mg total) under the tongue every 5 (five) minutes as needed for chest pain. 30 tablet 0    oxyCODONE  (OXY IR/ROXICODONE ) 5 MG immediate release tablet Take 1 tablet (5 mg total) by mouth every 12 (twelve) hours as needed for moderate pain (pain score 4-6) (pain score 4-6). (Patient not taking: Reported on 12/19/2023) 30 tablet 0   oxyCODONE  (OXY IR/ROXICODONE ) 5 MG immediate release tablet Take 1 tablet (5 mg total) by mouth every 12 (twelve) hours as needed for severe pain (pain score 7-10). (Patient not taking: Reported on 12/19/2023) 25 tablet 0   pantoprazole  (PROTONIX ) 40 MG tablet Take 1 tablet (40 mg total) by mouth daily. 90 tablet 0   QUEtiapine  (SEROQUEL ) 200 MG tablet Take 1.5 tablets (300 mg total) by mouth at bedtime. 90 tablet 0   traZODone  (DESYREL ) 100 MG tablet Take 1 tablet (100 mg total) by mouth at bedtime. 90 tablet 0   No current facility-administered medications for this visit.   Psychiatric Specialty Exam: General Appearance: appears at stated age, casually dressed and groomed   Behavior: pleasant and cooperative   Psychomotor Activity: no psychomotor agitation or retardation noted   Eye Contact: fair  Speech: normal amount, volume and fluency  Mood: upset  Affect: congruent  Thought Process: linear, goal directed, no circumstantial or tangential thought process noted, no racing thoughts or flight of ideas  Descriptions of Associations: intact   Thought Content Hallucinations: denies AH, VH , does not appear responding to stimuli  Delusions: some paranoia Suicidal Thoughts: denies SI, intention, plan  Homicidal Thoughts: denies HI, intention, plan   Alertness/Orientation: alert and fully oriented   Insight: fair Judgment: fair  Memory: intact   Executive Functions  Concentration: intact  Attention Span: fair  Recall: intact  Fund of Knowledge: fair   Physical Exam  General: Pleasant, well-appearing. No acute distress. Pulmonary: Normal effort. No wheezing or rales. Skin: No obvious rash or lesions. Neuro: A&Ox3.No focal  deficit.  Review of Systems  No reported symptoms   Screenings: AIMS    Flowsheet Row [facility]) [facility] Ad[facility] (Discharged) from 12/27/2016 in [facility]  AIMS Total Score 0 1   AUDIT    Flowsheet Row [facility] Ad[facility] (Dis[facility] Admission (Discharged) from 12/27/2016 in [facility]PAT[facility] BE[facility]  Alcohol  Use Disorder Identification Test Final Score (AUDIT) 4 0 32   GAD-7    Flowsheet Row Office Visit from 12/19/2023 in [facility] at [facility] Office Visit from 03/15/2023 in [facility] Primary Care at [facility] Video Visit from 08/01/2022 in [facility]al [facility] from 06/03/2019 in [facility]  Total GAD-7 Score 14 6 20 14    PHQ2-9    Flowsheet Row Office Visit from 12/19/2023 in [facility] Primary Care at [facility] Office Visit from 03/15/2023 in [facility] Primary Care at [facility] Office Visit from 12/13/2022 in [facility] Primary Care at [facility] Office Visit from 09/05/2022 in [facility] Primary Care at [facility] Video Visit from 08/01/2022 in [facility]  PHQ-2 Total Score 4 0 0 0 6  PHQ-9 Total Score 8 2 0 -- 22   Flowsheet Row Admission (Discharged) from 08/31/2023 in [facility] Most recent reading at 08/31/2023  9:19 AM ED from 05/30/2023 in [facility] Emergency Department at [facility] Most recent reading at 05/30/2023 10:55 AM UC from 05/30/2023 in [facility] Urgent Care at [facility] Most recent reading at 05/30/2023  9:55 AM  C-SSRS RISK CATEGORY No Risk No Risk No Risk   Ismael Franco, MD PGY-3  Psychiatry Resident

## 2024-02-07 NOTE — Telephone Encounter (Signed)
 3 Month F/U Scan due. LVM to call and schedule. Due around 02/28/2024

## 2024-02-08 ENCOUNTER — Ambulatory Visit (INDEPENDENT_AMBULATORY_CARE_PROVIDER_SITE_OTHER): Payer: MEDICAID | Admitting: Psychiatry

## 2024-02-08 VITALS — BP 145/80 | Ht 75.0 in | Wt 229.0 lb

## 2024-02-08 DIAGNOSIS — F250 Schizoaffective disorder, bipolar type: Secondary | ICD-10-CM

## 2024-02-08 DIAGNOSIS — F1011 Alcohol abuse, in remission: Secondary | ICD-10-CM | POA: Diagnosis not present

## 2024-02-08 DIAGNOSIS — F1411 Cocaine abuse, in remission: Secondary | ICD-10-CM | POA: Diagnosis not present

## 2024-02-08 DIAGNOSIS — F411 Generalized anxiety disorder: Secondary | ICD-10-CM

## 2024-02-08 DIAGNOSIS — Z5181 Encounter for therapeutic drug level monitoring: Secondary | ICD-10-CM | POA: Diagnosis not present

## 2024-02-08 DIAGNOSIS — F1111 Opioid abuse, in remission: Secondary | ICD-10-CM

## 2024-02-08 DIAGNOSIS — F4310 Post-traumatic stress disorder, unspecified: Secondary | ICD-10-CM

## 2024-02-08 MED ORDER — DULOXETINE HCL 30 MG PO CPEP
90.0000 mg | ORAL_CAPSULE | Freq: Every day | ORAL | 0 refills | Status: DC
Start: 2024-02-08 — End: 2024-04-25

## 2024-02-08 MED ORDER — TRAZODONE HCL 100 MG PO TABS: 100.0000 mg | ORAL_TABLET | Freq: Every day | ORAL | 0 refills | Status: DC

## 2024-02-08 MED ORDER — GABAPENTIN 600 MG PO TABS: ORAL_TABLET | ORAL | 1 refills | Status: DC

## 2024-02-08 MED ORDER — DIVALPROEX SODIUM 500 MG PO DR TAB: DELAYED_RELEASE_TABLET | ORAL | 2 refills | Status: DC

## 2024-02-08 MED ORDER — HYDROXYZINE HCL 25 MG PO TABS: 25.0000 mg | ORAL_TABLET | Freq: Two times a day (BID) | ORAL | 0 refills | Status: DC | PRN

## 2024-02-08 MED ORDER — QUETIAPINE FUMARATE 200 MG PO TABS: 300.0000 mg | ORAL_TABLET | Freq: Every day | ORAL | 0 refills | Status: DC

## 2024-02-08 NOTE — Patient Instructions (Addendum)
 Thank you for attending your appointment today.  -- increase seroquel  to 300 mg nightly -- Continue other medications as prescribed.  You have an appointment for CDIOP on Wednesday, 02-14-24 at 9:00.  Please come in at 8:30 AM.  Please do not make any changes to medications without first discussing with your provider. If you are experiencing a psychiatric emergency, please call 911 or present to your nearest emergency department. Additional crisis, medication management, and therapy resources are included below.  [facility]  [address], [place], [place] [address] [phone] WALK-IN URGENT CARE 24/7 FOR ANYONE [address] [place], [place]  [phone] Fax: [phone] [email or web address] *Interpreters available *Accepts all insurance and uninsured for Urgent Care needs *Accepts Medicaid and uninsured for outpatient treatment (below)      ONLY FOR [facility]  Below:    Outpatient New Patient Assessment/Therapy Walk-ins:        Monday, Wednesday, and Thursday 8am until slots are full (first come, first served)                   New Patient Psychiatry/Medication Management        Monday-Friday 8am-11am (first come, first served)               For all walk-ins we ask that you arrive by 7:15am, because patients will be seen in the order of arrival.

## 2024-02-12 ENCOUNTER — Other Ambulatory Visit (INDEPENDENT_AMBULATORY_CARE_PROVIDER_SITE_OTHER): Payer: MEDICAID

## 2024-02-12 DIAGNOSIS — Z79899 Other long term (current) drug therapy: Secondary | ICD-10-CM

## 2024-02-12 DIAGNOSIS — F200 Paranoid schizophrenia: Secondary | ICD-10-CM

## 2024-02-12 NOTE — Progress Notes (Signed)
 Pt tolerated labs well in right arm with no complaints.    JNL, CMA

## 2024-02-13 ENCOUNTER — Other Ambulatory Visit (INDEPENDENT_AMBULATORY_CARE_PROVIDER_SITE_OTHER): Payer: MEDICAID

## 2024-02-13 DIAGNOSIS — R0789 Other chest pain: Secondary | ICD-10-CM

## 2024-02-13 DIAGNOSIS — I361 Nonrheumatic tricuspid (valve) insufficiency: Secondary | ICD-10-CM

## 2024-02-13 DIAGNOSIS — F1020 Alcohol dependence, uncomplicated: Secondary | ICD-10-CM

## 2024-02-13 DIAGNOSIS — I10 Essential (primary) hypertension: Secondary | ICD-10-CM

## 2024-02-13 DIAGNOSIS — I351 Nonrheumatic aortic (valve) insufficiency: Secondary | ICD-10-CM | POA: Diagnosis not present

## 2024-02-13 DIAGNOSIS — I340 Nonrheumatic mitral (valve) insufficiency: Secondary | ICD-10-CM | POA: Diagnosis not present

## 2024-02-13 DIAGNOSIS — R0602 Shortness of breath: Secondary | ICD-10-CM

## 2024-02-13 DIAGNOSIS — I2510 Atherosclerotic heart disease of native coronary artery without angina pectoris: Secondary | ICD-10-CM

## 2024-02-13 LAB — LIPID PANEL
Chol/HDL Ratio: 3.4 ratio (ref 0.0–5.0)
Cholesterol, Total: 99 mg/dL — ABNORMAL LOW (ref 100–199)
HDL: 29 mg/dL — ABNORMAL LOW (ref >39)
LDL Chol Calc (NIH): 48 mg/dL (ref 0–99)
Triglycerides: 122 mg/dL (ref 0–149)
VLDL Cholesterol Cal: 22 mg/dL (ref 5–40)

## 2024-02-13 LAB — HEMOGLOBIN A1C
Est. average glucose Bld gHb Est-mCnc: 105 mg/dL
Hgb A1c MFr Bld: 5.3 % (ref 4.8–5.6)

## 2024-02-14 ENCOUNTER — Ambulatory Visit (INDEPENDENT_AMBULATORY_CARE_PROVIDER_SITE_OTHER): Payer: MEDICAID

## 2024-02-14 DIAGNOSIS — F1091 Alcohol use, unspecified, in remission: Secondary | ICD-10-CM

## 2024-02-14 DIAGNOSIS — F1420 Cocaine dependence, uncomplicated: Secondary | ICD-10-CM | POA: Diagnosis not present

## 2024-02-14 DIAGNOSIS — F4310 Post-traumatic stress disorder, unspecified: Secondary | ICD-10-CM

## 2024-02-14 DIAGNOSIS — F1220 Cannabis dependence, uncomplicated: Secondary | ICD-10-CM

## 2024-02-14 DIAGNOSIS — F250 Schizoaffective disorder, bipolar type: Secondary | ICD-10-CM

## 2024-02-14 DIAGNOSIS — F1290 Cannabis use, unspecified, uncomplicated: Secondary | ICD-10-CM
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
## 2024-02-16 ENCOUNTER — Encounter: Payer: Self-pay | Admitting: Cardiovascular Disease

## 2024-02-16 ENCOUNTER — Ambulatory Visit (HOSPITAL_COMMUNITY): Payer: MEDICAID

## 2024-02-16 ENCOUNTER — Ambulatory Visit: Payer: MEDICAID | Admitting: Cardiovascular Disease

## 2024-02-16 VITALS — BP 120/68 | HR 60 | Ht 75.0 in | Wt 230.0 lb

## 2024-02-16 DIAGNOSIS — I10 Essential (primary) hypertension: Secondary | ICD-10-CM | POA: Diagnosis not present

## 2024-02-16 DIAGNOSIS — I351 Nonrheumatic aortic (valve) insufficiency: Secondary | ICD-10-CM | POA: Diagnosis not present

## 2024-02-16 DIAGNOSIS — I2510 Atherosclerotic heart disease of native coronary artery without angina pectoris: Secondary | ICD-10-CM | POA: Diagnosis not present

## 2024-02-16 DIAGNOSIS — Z9861 Coronary angioplasty status: Secondary | ICD-10-CM

## 2024-02-16 DIAGNOSIS — I952 Hypotension due to drugs: Secondary | ICD-10-CM | POA: Diagnosis not present

## 2024-02-16 DIAGNOSIS — R0789 Other chest pain: Secondary | ICD-10-CM

## 2024-02-16 MED ORDER — ISOSORBIDE MONONITRATE ER 30 MG PO TB24: 30.0000 mg | ORAL_TABLET | Freq: Two times a day (BID) | ORAL | 1 refills | Status: DC

## 2024-02-16 NOTE — Progress Notes (Signed)
 Cardiology Office Note   Date:  02/16/2024   ID:  Keith Gibbs, DOB 04-30-1961, MRN [id number]  PCP:  Lorren Greig PARAS, NP  Cardiologist:  Denyse Bathe, MD      History of Present Illness: Keith Gibbs is a 63 y.o. male who presents for  Chief Complaint  Patient presents with   Follow-up    Echo and NST results    Infrequent chest pains, on imdur  feeling better.      Past Medical History:  Diagnosis Date   Anginal pain (HCC)    Anxiety    Arthritis    Bipolar 1 disorder (HCC)    Bursitis    CAD (coronary artery disease)    Chronic pain    COPD (chronic obstructive pulmonary disease) (HCC)    Current use of long term anticoagulation    DAPT (ASA + clopidogrel )   Depression    Diverticulitis    Dyspnea    GERD (gastroesophageal reflux disease)    Grade I diastolic dysfunction    Hepatitis C 2012   No longer has Hep C   HLD (hyperlipidemia)    Hypertension    MI (myocardial infarction) (HCC)    Polysubstance abuse (HCC)    cocaine, marijuana, ETOH   PUD (peptic ulcer disease)    S/P angioplasty with stent 06/10/2016   a.) 90% stenosis of pLAD to mLAD - 2.5 x 18 mm Xience Alpine (DES x 1) placed to pLAD   S/P PTCA (percutaneous transluminal coronary angioplasty) 12/04/2019   a.) 60% in stent restenosis of DES to pLAD; LVEF 65%.   Schizophrenia (HCC)    Stroke [facility])    Valvular insufficiency    a.) Mild MR, TR, PR; mild to moderate AR on 03/05/2018 TTE     Past Surgical History:  Procedure Laterality Date   ABDOMINAL SURGERY     removed small piece of intestines due to [facility] Diverticulosis   ANTERIOR LAT LUMBAR FUSION N/A 08/23/2022   Procedure: DIRECT LATERAL INTERBODY FUSION  LUMBAR TWO- LUMBAR THREE, LUMBAR THREE-LUMBAR FOUR , EXPLORE AND EXTEND FUSION LUMBAR TWO-LUMBAR FIVE, POSTERIOR DECOMPRESSION LUMBAR THREE-LUMBAR FOUR, LEFT LUMBAR TWO-LUMBAR THREE;  Surgeon: Debby Dorn MATSU, MD;  Location: [facility] OR;  Service: Neurosurgery;  Laterality:  N/A;   APPENDECTOMY     BACK SURGERY     CARDIAC CATHETERIZATION Left 06/10/2016   Procedure: Left Heart Cath and Coronary Angiography;  Surgeon: Denyse DELENA Bathe, MD;  Location: [facility] INVASIVE CV LAB;  Service: Cardiovascular;  Laterality: Left;   CARDIAC CATHETERIZATION N/A 06/10/2016   Procedure: Coronary Stent Intervention;  Surgeon: Cara JONETTA Lovelace, MD;  Location: [facility] INVASIVE CV LAB;  Service: Cardiovascular;  Laterality: N/A;   CHOLECYSTECTOMY N/A 02/17/2022   Procedure: LAPAROSCOPIC CHOLECYSTECTOMY;  Surgeon: Stevie Herlene Righter, MD;  Location: [facility] OR;  Service: General;  Laterality: N/A;   COLON SURGERY     COLONOSCOPY     COLONOSCOPY WITH PROPOFOL  N/A 01/05/2017   Procedure: COLONOSCOPY WITH PROPOFOL ;  Surgeon: Therisa Bi, MD;  Location: [facility] ENDOSCOPY;  Service: Endoscopy;  Laterality: N/A;   COLONOSCOPY WITH PROPOFOL  N/A 02/13/2020   Procedure: COLONOSCOPY WITH PROPOFOL ;  Surgeon: Janalyn Keene NOVAK, MD;  Location: [facility] ENDOSCOPY;  Service: Endoscopy;  Laterality: N/A;   CORONARY ANGIOPLASTY WITH STENT PLACEMENT     CORONARY PRESSURE/FFR STUDY N/A 12/04/2019   Procedure: INTRAVASCULAR PRESSURE WIRE/FFR STUDY;  Surgeon: Wonda Sharper, MD;  Location: [facility] INVASIVE CV LAB;  Service: Cardiovascular;  Laterality: N/A;  ESOPHAGOGASTRODUODENOSCOPY (EGD) WITH PROPOFOL  N/A 01/05/2017   Procedure: ESOPHAGOGASTRODUODENOSCOPY (EGD) WITH PROPOFOL ;  Surgeon: Therisa Bi, MD;  Location: [facility] ENDOSCOPY;  Service: Endoscopy;  Laterality: N/A;   ESOPHAGOGASTRODUODENOSCOPY (EGD) WITH PROPOFOL  N/A 02/13/2020   Procedure: ESOPHAGOGASTRODUODENOSCOPY (EGD) WITH PROPOFOL ;  Surgeon: Janalyn Keene NOVAK, MD;  Location: [facility] ENDOSCOPY;  Service: Endoscopy;  Laterality: N/A;   KNEE ARTHROSCOPY WITH MEDIAL MENISECTOMY Right 09/05/2017   Procedure: KNEE ARTHROSCOPY WITH MEDIAL AND LATERAL  MENISECTOMY PARTIAL SYNOVECTOMY;  Surgeon: Kathlynn Sharper, MD;  Location: [facility] ORS;  Service: Orthopedics;  Laterality:  Right;   LEFT HEART CATH AND CORONARY ANGIOGRAPHY N/A 12/04/2019   Procedure: LEFT HEART CATH AND CORONARY ANGIOGRAPHY;  Surgeon: Wonda Sharper, MD;  Location: [facility] INVASIVE CV LAB;  Service: Cardiovascular;  Laterality: N/A;   LEFT HEART CATH AND CORONARY ANGIOGRAPHY Left 11/25/2022   Procedure: LEFT HEART CATH AND CORONARY ANGIOGRAPHY;  Surgeon: Fernand Denyse LABOR, MD;  Location: [facility] INVASIVE CV LAB;  Service: Cardiovascular;  Laterality: Left;   REVERSE SHOULDER ARTHROPLASTY Right 08/31/2023   Procedure: RIGHT REVERSE SHOULDER ARTHROPLASTY;  Surgeon: [name], MD;  Location: [facility] OR;  Service: Orthopedics;  Laterality: Right;   SHOULDER SURGERY Right 04/09/2012   SPINE SURGERY     TOTAL KNEE ARTHROPLASTY Right 01/19/2021   Procedure: TOTAL KNEE ARTHROPLASTY - [place] Amber to Assist;  Surgeon: Kathlynn Sharper, MD;  Location: [facility] ORS;  Service: Orthopedics;  Laterality: Right;     Current Outpatient Medications  Medication Sig Dispense Refill   acetaminophen  (TYLENOL ) 325 MG tablet Take 1 tablet (325 mg total) by mouth every 6 (six) hours as needed for mild pain (pain score 1-3) (or temp > 100.5). 60 tablet 0   albuterol  (VENTOLIN  HFA) 108 (90 Base) MCG/ACT inhaler Inhale 2 puffs into the lungs every 6 (six) hours as needed for wheezing or shortness of breath. 18 g 2   aspirin  EC 81 MG tablet Take 1 tablet (81 mg total) by mouth daily. Swallow whole. 30 tablet 0   atorvastatin  (LIPITOR ) 80 MG tablet TAKE 1 TABLET BY MOUTH EVERY DAY 90 tablet 3   budesonide -glycopyrrolate -formoterol  (BREZTRI  AEROSPHERE) 160-9-4.8 MCG/ACT AERO inhaler Inhale 2 puffs into the lungs in the morning and at bedtime. 1 each 2   celecoxib  (CELEBREX ) 100 MG capsule Take 2 capsules (200 mg total) by mouth 2 (two) times daily. 60 capsule 1   divalproex  (DEPAKOTE ) 500 MG DR tablet Take half tablet in the morning and 3 tablets at bedtime 105 tablet 2   DULoxetine  (CYMBALTA ) 30 MG capsule Take 3 capsules (90 mg total) by  mouth daily. 270 capsule 0   gabapentin  (NEURONTIN ) 600 MG tablet Take 1 tablet (600 mg total) by mouth daily AND 2 tablets (1,200 mg total) at bedtime. 90 tablet 1   hydrOXYzine  (ATARAX ) 25 MG tablet Take 1 tablet (25 mg total) by mouth 2 (two) times daily as needed. 270 tablet 0   isosorbide  mononitrate (IMDUR ) 30 MG 24 hr tablet Take 1 tablet (30 mg total) by mouth 2 (two) times daily. 30 tablet 1   nitroGLYCERIN  (NITROSTAT ) 0.4 MG SL tablet Place 1 tablet (0.4 mg total) under the tongue every 5 (five) minutes as needed for chest pain. 30 tablet 0   pantoprazole  (PROTONIX ) 40 MG tablet Take 1 tablet (40 mg total) by mouth daily. 90 tablet 0   QUEtiapine  (SEROQUEL ) 200 MG tablet Take 1.5 tablets (300 mg total) by mouth at bedtime. 90 tablet 0   traZODone  (DESYREL ) 100 MG tablet Take  1 tablet (100 mg total) by mouth at bedtime. 90 tablet 0   docusate sodium  (COLACE) 100 MG capsule Take 1 capsule (100 mg total) by mouth 2 (two) times daily. (Patient not taking: Reported on 02/16/2024) 10 capsule 0   oxyCODONE  (OXY IR/ROXICODONE ) 5 MG immediate release tablet Take 1 tablet (5 mg total) by mouth every 12 (twelve) hours as needed for moderate pain (pain score 4-6) (pain score 4-6). (Patient not taking: Reported on 02/16/2024) 30 tablet 0   oxyCODONE  (OXY IR/ROXICODONE ) 5 MG immediate release tablet Take 1 tablet (5 mg total) by mouth every 12 (twelve) hours as needed for severe pain (pain score 7-10). (Patient not taking: Reported on 02/16/2024) 25 tablet 0   No current facility-administered medications for this visit.    Allergies:   Asenapine, Dextromethorphan hbr, Guaifenesin , Latuda [lurasidone hcl], Lurasidone, and Phenylephrine     Social History:   reports that he quit smoking about 25 years ago. His smoking use included cigarettes. He started smoking about 45 years ago. He has a 63.5 pack-year smoking history. He has been exposed to tobacco smoke. He has never used smokeless tobacco. He reports  that he does not currently use alcohol  after a past usage of about 3.0 standard drinks of alcohol  per week. He reports that he does not currently use drugs after having used the following drugs: Cocaine and Marijuana.   Family History:  family history includes Depression in his mother; Early death in his father; Heart attack in his father; Heart disease in his father and mother; Hypertension in his father, mother, and sister; Osteoarthritis in his mother; Parkinson's disease in his maternal grandfather.    ROS:     Review of Systems  Constitutional: Negative.   HENT: Negative.    Eyes: Negative.   Respiratory: Negative.    Gastrointestinal: Negative.   Genitourinary: Negative.   Musculoskeletal: Negative.   Skin: Negative.   Neurological: Negative.   Endo/Heme/Allergies: Negative.   Psychiatric/Behavioral: Negative.    All other systems reviewed and are negative.     All other systems are reviewed and negative.    PHYSICAL EXAM: VS:  BP 120/68   Pulse 60   Ht 6' 3 (1.905 m)   Wt 230 lb (104.3 kg)   SpO2 95%   BMI 28.75 kg/m  , BMI Body mass index is 28.75 kg/m. Last weight:  Wt Readings from Last 3 Encounters:  02/16/24 230 lb (104.3 kg)  01/01/24 229 lb (103.9 kg)  12/19/23 223 lb (101.2 kg)     Physical Exam Vitals reviewed.  Constitutional:      Appearance: Normal appearance. He is normal weight.  HENT:     Head: Normocephalic.     Nose: Nose normal.     Mouth/Throat:     Mouth: Mucous membranes are moist.  Eyes:     Pupils: Pupils are equal, round, and reactive to light.  Cardiovascular:     Rate and Rhythm: Normal rate and regular rhythm.     Pulses: Normal pulses.     Heart sounds: Normal heart sounds.  Pulmonary:     Effort: Pulmonary effort is normal.  Abdominal:     General: Abdomen is flat. Bowel sounds are normal.  Musculoskeletal:        General: Normal range of motion.     Cervical back: Normal range of motion.  Skin:    General: Skin is  warm.  Neurological:     General: No focal deficit present.  Mental Status: He is alert.  Psychiatric:        Mood and Affect: Mood normal.       EKG:   Recent Labs: 05/30/2023: B Natriuretic Peptide 34.3; Magnesium  1.6 08/24/2023: Hemoglobin 15.6; Platelets 165 11/15/2023: ALT 6; BUN 11; Creatinine, Ser 0.94; Potassium 4.9; Sodium 138    Lipid Panel    Component Value Date/Time   CHOL 99 (L) 02/12/2024 0840   TRIG 122 02/12/2024 0840   HDL 29 (L) 02/12/2024 0840   CHOLHDL 3.4 02/12/2024 0840   CHOLHDL 3.0 04/26/2022 1530   VLDL 10 04/26/2022 1530   LDLCALC 48 02/12/2024 0840      Other studies Reviewed: Additional studies/ records that were reviewed today include:  Review of the above records demonstrates:       No data to display            ASSESSMENT AND PLAN:    ICD-10-CM   1. Nonrheumatic aortic valve insufficiency  I35.1    moderate AR, normal EF on echo    2. CAD S/P percutaneous coronary angioplasty  I25.10 isosorbide  mononitrate (IMDUR ) 30 MG 24 hr tablet   Z98.61     3. Essential hypertension  I10 isosorbide  mononitrate (IMDUR ) 30 MG 24 hr tablet    4. Hypotension due to drugs  I95.2 isosorbide  mononitrate (IMDUR ) 30 MG 24 hr tablet    5. Other chest pain  R07.89    stress test mild LAd ischaemia, feeling better, change imdur  30 bid       Problem List Items Addressed This Visit       Cardiovascular and Mediastinum   CAD S/P percutaneous coronary angioplasty (Chronic)   Relevant Medications   isosorbide  mononitrate (IMDUR ) 30 MG 24 hr tablet   Essential hypertension   Relevant Medications   isosorbide  mononitrate (IMDUR ) 30 MG 24 hr tablet   Hypotension due to drugs   Relevant Medications   isosorbide  mononitrate (IMDUR ) 30 MG 24 hr tablet     Other   Chest pain   Other Visit Diagnoses       Nonrheumatic aortic valve insufficiency    -  Primary   moderate AR, normal EF on echo   Relevant Medications   isosorbide  mononitrate  (IMDUR ) 30 MG 24 hr tablet          Disposition:   Return in about 4 weeks (around 03/15/2024).    Total time spent: 30 minutes  Signed,  Denyse Bathe, MD  02/16/2024 11:57 AM    [facility]

## 2024-02-19 ENCOUNTER — Ambulatory Visit (HOSPITAL_COMMUNITY): Payer: MEDICAID

## 2024-02-20 ENCOUNTER — Ambulatory Visit (HOSPITAL_COMMUNITY): Payer: MEDICAID

## 2024-02-20 DIAGNOSIS — F1420 Cocaine dependence, uncomplicated: Secondary | ICD-10-CM

## 2024-02-20 DIAGNOSIS — F1091 Alcohol use, unspecified, in remission: Secondary | ICD-10-CM

## 2024-02-20 DIAGNOSIS — F1290 Cannabis use, unspecified, uncomplicated: Secondary | ICD-10-CM

## 2024-02-20 NOTE — Progress Notes (Signed)
  Time: 4pm to 4:30pm  Keith Gibbs presents today as  we were not able to complete his Medicaid Treatment Plan for CD-IOP.    Keith Gibbs says he will need to start CD IOP on February 26, 2024 as [employer] required 3 full days notice on transportation.  Keith Gibbs reports no change in his sobriety date of two weeks ago around January 30, 2024. He says he can not remember the exact date.  Keith Gibbs says he did take 2 of his mother's tramadol  pills.  He says he will not take anymore and does not plan to be on any type of pain killer. Therapist explains he can be on non addictive medications while in the CD-IOP.  Keith Gibbs says he has medical appointments on 03-18-24 Dr. CANDIE Sprinkles and 03-20-24 he sees Dr Greig Drones.  Keith Gibbs says he attended his first NA meeting last evening. He comments that he likes NA a lot better than AA though it was many years ago when he attended AA.  Keith Gibbs says he lives with his mother as he cannot yet afford to live elsewhere. He says he thinks his mother has been unhappy and angry all her life. He says he understand, as he says it must have been frustrating dealing with a sick child that was hospitalized multiple times.  Keith Gibbs  says his mother called him bad names a few days ago but apologized yesterday. Keith Gibbs says he has never been sure what to expect from her.  Keith Gibbs participates in creating his Medicaid Treatment plan for SA IOP and says he looks very forward to beginning IOP as he needs help learning coping skills on how to interrupt his using.  He says he will go weeks and months sober, but then relapses and he is not sure how to interrupt that cycle.  It was explained to [facility] that we will obtain a rapid urine drug screen and get his vitals checked when he arrives on August 4. 2025.   Darice Simpler, MS, LMFT, LCAS

## 2024-02-21 ENCOUNTER — Ambulatory Visit (HOSPITAL_COMMUNITY): Payer: MEDICAID

## 2024-02-23 ENCOUNTER — Ambulatory Visit (HOSPITAL_COMMUNITY): Payer: MEDICAID

## 2024-02-26 ENCOUNTER — Ambulatory Visit (HOSPITAL_COMMUNITY): Payer: MEDICAID | Admitting: Medical

## 2024-02-26 ENCOUNTER — Encounter (HOSPITAL_COMMUNITY): Payer: Self-pay

## 2024-02-26 ENCOUNTER — Ambulatory Visit (HOSPITAL_COMMUNITY): Payer: MEDICAID

## 2024-02-26 DIAGNOSIS — F1290 Cannabis use, unspecified, uncomplicated: Secondary | ICD-10-CM

## 2024-02-26 DIAGNOSIS — F1420 Cocaine dependence, uncomplicated: Secondary | ICD-10-CM | POA: Diagnosis not present

## 2024-02-26 DIAGNOSIS — F4321 Adjustment disorder with depressed mood: Secondary | ICD-10-CM

## 2024-02-26 DIAGNOSIS — R911 Solitary pulmonary nodule: Secondary | ICD-10-CM

## 2024-02-26 DIAGNOSIS — F411 Generalized anxiety disorder: Secondary | ICD-10-CM

## 2024-02-26 DIAGNOSIS — F1091 Alcohol use, unspecified, in remission: Secondary | ICD-10-CM

## 2024-02-26 DIAGNOSIS — Z639 Problem related to primary support group, unspecified: Secondary | ICD-10-CM

## 2024-02-26 DIAGNOSIS — G894 Chronic pain syndrome: Secondary | ICD-10-CM

## 2024-02-26 DIAGNOSIS — F1011 Alcohol abuse, in remission: Secondary | ICD-10-CM | POA: Diagnosis not present

## 2024-02-26 DIAGNOSIS — F250 Schizoaffective disorder, bipolar type: Secondary | ICD-10-CM

## 2024-02-26 DIAGNOSIS — F1111 Opioid abuse, in remission: Secondary | ICD-10-CM | POA: Diagnosis not present

## 2024-02-26 DIAGNOSIS — F4310 Post-traumatic stress disorder, unspecified: Secondary | ICD-10-CM

## 2024-02-26 DIAGNOSIS — F1411 Cocaine abuse, in remission: Secondary | ICD-10-CM

## 2024-02-26 DIAGNOSIS — F17210 Nicotine dependence, cigarettes, uncomplicated: Secondary | ICD-10-CM
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
## 2024-02-26 NOTE — Progress Notes (Signed)
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 Activity:  Normal  Concentration:  Concentration: Good and Attention Span: Good  Recall:  NA  Fund of Knowledge:WDL  Language: Good  Akathisia:  No  Handed:  Right  AIMS (if indicated):  Not done  Assets:  Desire for Improvement Financial Resources/Insurance Resilience Social Support Talents/Skills Transportation  ADL's:  Intact  Cognition: WNL  Sleep:    Difficulty staying/falling asleep      Screenings: AIMS    Flowsheet Row [facility] (Discharged) from 12/27/2016 in [facility]AL [facility]ICINE  AIMS Total Score 0 1   AUDIT    Flowsheet Row [facility] (Discharged[facility] Admission (Discharged) from 12/27/2016 in [facility] INPATIENT BEHAVIORAL MEDICINE   Alcohol  Use Disorder Identification Test Final Score (AUDIT) 4 0 32   GAD-7    Flowsheet Row Office Visit from 12/19/2023 in [facility] at [facility] Office Visit from 03/15/2023 in [facility] at [facility] Video Visit from 08/01/2022 in [facility] Integrated Behavioral Health from 06/03/2019 in [facility] [place]  Total GAD-7 Score 14 6 20 14    PHQ2-9    Flowsheet Row Office Visit from 12/19/2023 in [facility] at [facility] Office Visit from 03/15/2023 in [facility] at [facility] Office Visit from 12/13/2022 in [facility] at [facility] Office Visit from 09/05/2022 in [facility] Care at [facility] Video Visit from 08/01/2022 in [facility]  PHQ-2 Total Score 4 0 0 0 6  PHQ-9 Total Score 8 2 0 -- 22   Flowsheet Row Admission (Discharged) from 08/31/2023 in [facility] Most recent reading at 08/31/2023  9:19 AM ED from 05/30/2023 in [facility] Department at [facility] Most recent reading at 05/30/2023 10:55 AM UC from 05/30/2023 in [facility] at [facility]) Most recent reading at 05/30/2023  9:55 AM  C-SSRS RISK CATEGORY No Risk No Risk No Risk    Assessment  Cocaine dependence severe unable  to stay clean Schizoaffective DO stable on medications CPTSD Lonely (Lack of support) Poor housing situation  and Plan: Treatment Plan/Recommendations:  Plan of Care: SUDs/Core issues [facility] CDIOP see Counselor's individualized treatment plan  Laboratory:UDS per protocol  Psychotherapy: CD IOP Group,Individual and Family  Medications: See list MAT Baclofen  Routine PRN Medications: None from IOP  Consultations: NA  Safety Concerns: RISK ASSESSMENT -Negative  Other:  Anatomy and Biology of addiction/Addicted Brain  reviewed with Google (Pictures of Anatomy and Function of Human brain along with  Pictures of Pet Scans Of Addicted Brains and You Tube video(Baclofen reduces cravings)      Carlin Emmer, PA-C 8/13/20255:25 PM

## 2024-02-28 ENCOUNTER — Ambulatory Visit (INDEPENDENT_AMBULATORY_CARE_PROVIDER_SITE_OTHER): Payer: MEDICAID

## 2024-02-28 ENCOUNTER — Ambulatory Visit (HOSPITAL_COMMUNITY): Payer: MEDICAID

## 2024-02-28 DIAGNOSIS — F4310 Post-traumatic stress disorder, unspecified: Secondary | ICD-10-CM

## 2024-02-28 DIAGNOSIS — F1420 Cocaine dependence, uncomplicated: Secondary | ICD-10-CM | POA: Diagnosis not present

## 2024-02-28 DIAGNOSIS — F1091 Alcohol use, unspecified, in remission: Secondary | ICD-10-CM

## 2024-02-28 DIAGNOSIS — F1290 Cannabis use, unspecified, uncomplicated: Secondary | ICD-10-CM
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
## 2024-02-29 ENCOUNTER — Ambulatory Visit
Admission: RE | Admit: 2024-02-29 | Discharge: 2024-02-29 | Disposition: A | Discharge status: Home / Self Care | Payer: MEDICAID | Source: Ambulatory Visit | Attending: Family | Admitting: Family

## 2024-02-29 DIAGNOSIS — R911 Solitary pulmonary nodule: Secondary | ICD-10-CM

## 2024-02-29 LAB — TOXICOLOGY SCREEN, URINE: Creatinine, POC: 90 mg/dL

## 2024-03-01 ENCOUNTER — Ambulatory Visit (HOSPITAL_COMMUNITY): Payer: MEDICAID

## 2024-03-01 DIAGNOSIS — F1420 Cocaine dependence, uncomplicated: Secondary | ICD-10-CM

## 2024-03-01 DIAGNOSIS — F1290 Cannabis use, unspecified, uncomplicated: Secondary | ICD-10-CM

## 2024-03-01 DIAGNOSIS — F4310 Post-traumatic stress disorder, unspecified: Secondary | ICD-10-CM

## 2024-03-01 DIAGNOSIS — F1091 Alcohol use, unspecified, in remission: Secondary | ICD-10-CM
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
## 2024-03-04 ENCOUNTER — Ambulatory Visit (HOSPITAL_COMMUNITY): Payer: MEDICAID

## 2024-03-04 ENCOUNTER — Ambulatory Visit (INDEPENDENT_AMBULATORY_CARE_PROVIDER_SITE_OTHER): Payer: MEDICAID | Admitting: Licensed Clinical Social Worker

## 2024-03-04 DIAGNOSIS — F250 Schizoaffective disorder, bipolar type: Secondary | ICD-10-CM

## 2024-03-04 DIAGNOSIS — F1420 Cocaine dependence, uncomplicated: Secondary | ICD-10-CM | POA: Diagnosis not present

## 2024-03-04 DIAGNOSIS — F1290 Cannabis use, unspecified, uncomplicated: Secondary | ICD-10-CM

## 2024-03-04 DIAGNOSIS — F1091 Alcohol use, unspecified, in remission: Secondary | ICD-10-CM

## 2024-03-04 DIAGNOSIS — F4310 Post-traumatic stress disorder, unspecified: Secondary | ICD-10-CM
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
## 2024-03-06 ENCOUNTER — Ambulatory Visit (INDEPENDENT_AMBULATORY_CARE_PROVIDER_SITE_OTHER): Payer: MEDICAID

## 2024-03-06 ENCOUNTER — Ambulatory Visit (HOSPITAL_COMMUNITY): Payer: MEDICAID

## 2024-03-06 ENCOUNTER — Encounter (HOSPITAL_COMMUNITY): Payer: Self-pay

## 2024-03-06 DIAGNOSIS — F1290 Cannabis use, unspecified, uncomplicated: Secondary | ICD-10-CM

## 2024-03-06 DIAGNOSIS — F1420 Cocaine dependence, uncomplicated: Secondary | ICD-10-CM

## 2024-03-06 DIAGNOSIS — F4310 Post-traumatic stress disorder, unspecified: Secondary | ICD-10-CM

## 2024-03-06 DIAGNOSIS — F1091 Alcohol use, unspecified, in remission: Secondary | ICD-10-CM

## 2024-03-06 NOTE — Progress Notes (Signed)
 Daily Group Progress Note   Program: CD IOP     Group Time: 9 a.m. to 12 p.m.      Type of Therapy: Process and Psychoeducational    Topic: The therapist checks in with group members, assess for SI/HI/psychosis and overall level of functioning. The therapists inquire about sobriety date and number of community support meetings attended since last session.  Therapist introduces new group member. Therapist discusses the following issues today and facilitates discussion among the group: ask group members to give a synopsis of what brought them to IOP,  Post Acute Withdrawal Syndrome including asking  group members to identify where they are in this process, the importance of being all in vs continuing to use, how it takes time for loved ones to start trusting when one has been in active addition, dealing with emotions without substances and important skills and goals for 1st year of recovery from addictions and recovery site.    Summary: Torrez presents for today rating his depression  as 4 and anxiety as a 6.  He reports the same sobriety date.  He says he attends NA every evening and has a sponsor. He identifies his emotions today as anxious and irritated. He says he is trying to fill the emptiness he feels with recovery. Alexander says he is taking the armor off and letting people get to know him.  He says that he is bothered by loneliness and notes his wife passed away 8 years ago and then his youngest daughter passed away three months after this in a car accident.  Vasilios says he knows the meetings say not to get into a new relationship for the first year of recovery. He says he has a daughter and son who will not talk to him because of his use.   Amir shares that he attempted suicide x 3 but it did not work.  He said he was hospitalized, went through detox and then went to residential at [facility]. Charly says he is 100% dedicated to recovery.  He reports he know he can never have a drink  again.  Nunzio reports his relationship with his mother is improving and that he overheard her on the phone, bragging about how well he is doing. Shafin says that to deal with emotions that come up he listens to music and calls his sponsor or someone in [place].   Progress Towards Goals: Eshan's reports his sobriety date is 02-19-24.   UDS collected: No Results: No   AA/NA attended?:  Yes   Sponsor?:  Yes     Darice Simpler, MS, LMFT, LCAS 03/06/2024

## 2024-03-08 ENCOUNTER — Ambulatory Visit (HOSPITAL_COMMUNITY): Payer: MEDICAID

## 2024-03-08 ENCOUNTER — Encounter (HOSPITAL_COMMUNITY): Payer: Self-pay | Admitting: Medical

## 2024-03-08 ENCOUNTER — Ambulatory Visit (INDEPENDENT_AMBULATORY_CARE_PROVIDER_SITE_OTHER): Payer: MEDICAID | Admitting: Medical

## 2024-03-08 DIAGNOSIS — F1091 Alcohol use, unspecified, in remission: Secondary | ICD-10-CM

## 2024-03-08 DIAGNOSIS — F250 Schizoaffective disorder, bipolar type: Secondary | ICD-10-CM

## 2024-03-08 DIAGNOSIS — G894 Chronic pain syndrome: Secondary | ICD-10-CM

## 2024-03-08 DIAGNOSIS — F4310 Post-traumatic stress disorder, unspecified: Secondary | ICD-10-CM

## 2024-03-08 DIAGNOSIS — F1111 Opioid abuse, in remission: Secondary | ICD-10-CM

## 2024-03-08 DIAGNOSIS — F1420 Cocaine dependence, uncomplicated: Secondary | ICD-10-CM

## 2024-03-08 DIAGNOSIS — F515 Nightmare disorder: Secondary | ICD-10-CM

## 2024-03-08 DIAGNOSIS — F1290 Cannabis use, unspecified, uncomplicated: Secondary | ICD-10-CM

## 2024-03-08 DIAGNOSIS — F4321 Adjustment disorder with depressed mood: Secondary | ICD-10-CM

## 2024-03-08 DIAGNOSIS — F1011 Alcohol abuse, in remission: Secondary | ICD-10-CM

## 2024-03-08 DIAGNOSIS — F17210 Nicotine dependence, cigarettes, uncomplicated: Secondary | ICD-10-CM

## 2024-03-08 DIAGNOSIS — F411 Generalized anxiety disorder: Secondary | ICD-10-CM

## 2024-03-08 MED ORDER — PRAZOSIN HCL 2 MG PO CAPS: 2.0000 mg | ORAL_CAPSULE | Freq: Every day | ORAL | 2 refills | Status: DC
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
## 2024-03-11 ENCOUNTER — Ambulatory Visit (INDEPENDENT_AMBULATORY_CARE_PROVIDER_SITE_OTHER): Payer: MEDICAID | Admitting: Licensed Clinical Social Worker

## 2024-03-11 ENCOUNTER — Ambulatory Visit (HOSPITAL_COMMUNITY): Payer: MEDICAID

## 2024-03-11 DIAGNOSIS — F1290 Cannabis use, unspecified, uncomplicated: Secondary | ICD-10-CM

## 2024-03-11 DIAGNOSIS — F1420 Cocaine dependence, uncomplicated: Secondary | ICD-10-CM

## 2024-03-11 DIAGNOSIS — F1091 Alcohol use, unspecified, in remission: Secondary | ICD-10-CM

## 2024-03-11 DIAGNOSIS — F250 Schizoaffective disorder, bipolar type: Secondary | ICD-10-CM

## 2024-03-11 DIAGNOSIS — F17210 Nicotine dependence, cigarettes, uncomplicated: Secondary | ICD-10-CM

## 2024-03-11 DIAGNOSIS — F4310 Post-traumatic stress disorder, unspecified: Secondary | ICD-10-CM

## 2024-03-11 NOTE — Progress Notes (Addendum)
 Daily Group Progress Note   Program: CD IOP     Group Time: 9 a.m. to 12 p.m.      Type of Therapy: Process and Psychoeducational    Topic: The therapists check in with group members, assess for SI/HI/psychosis and overall level of functioning. The therapists inquire about sobriety date and number of community support meetings attended since last session.   The therapists introduce a new group member having group members add their story of how they came to be in IOP to group check-in. The therapists explain that a relapse is an opportunity for learning and question how it is that the disease of addiction can trick people in to doing the same thing over and over thinking things will be different the next time. The therapists point out that their disease wants them to focus on a moment in time; however, it is important to play the story forward. The therapists normalize boredom in early recovery as a result of PAWS and discuss how exercise can help to reduce the length and intensity of PAWS.  The therapists also explain in people cannot have healthy friendships or healthy relationships with significant others when one are both parties are in active addiction.  In such a relationship, the person who is an active addiction's primary relationship is the chemical or chemicals that they are abusing.  The therapists pointed out the fact that people in the 12-step program are not perfect but as a group are far more dependable and trustworthy than those who are in active addiction.The therapists suggest that as people progress in recovery that they will learn how to deal with interpersonal conflict more assertively such that they will not just drop out of the program and go back to using substances.  The therapists talk about what it means to be an adult child of an alcoholic or addict.  Lastly, the therapist discuss how people in recovery sometimes expect family members to put things in the past the moment  they enter recovery; however, this fails to recognize that trust will take time to restore and that remaining sober is a prerequisite for restoring this trust.     Summary: Muhamad presents today rating both his depression and anxiety as being a 2.  He says that he often feels that he does not deserve this but says that his sponsor has challenged him to avoid this type of thinking.  Duvid says that he came to group as he has a pattern of relapsing every 3 to 4 months but cannot figure out why.  He says that he believes that things are going to be different this time and that he is more serious than before and he has a good sponsor in NA.  Yovanni says that as long as he continues to attend meetings daily and work steps that he feels confident that he will not relapse.  He says that he is either 30 days sober or nearing 30 days.  Madoc talks about having had a negative experience in the past when in Alcoholics Anonymous he started attending Narcotics Anonymous; however, he says that he is now considering attending alcoholic's Anonymous meetings on Tuesdays and Thursdays in addition to his NA meetings.  Jeryn admits that he was perhaps expecting too much of people in [place] but now plans on being more realistic.  Rilley describes his mood as calm, focused, and comfortable in his own skin.  Valon says that his relationship with his mother continues to improve and  that she picked him up from a meeting when he missed his ride.  Progress Towards Goals: Jaiquan reports no change in his sobriety date.   UDS collected: Yes Results: No   AA/NA attended?:  Yes   Sponsor?:  Yes     Elsie Maier, MA, LCSW, [facility], LCAS Darice Simpler, LMFT, LCAS 03/11/2024

## 2024-03-13 ENCOUNTER — Ambulatory Visit (HOSPITAL_COMMUNITY): Payer: MEDICAID

## 2024-03-13 ENCOUNTER — Telehealth: Payer: Self-pay | Admitting: Acute Care

## 2024-03-13 ENCOUNTER — Telehealth (HOSPITAL_COMMUNITY): Payer: Self-pay

## 2024-03-13 ENCOUNTER — Telehealth: Payer: Self-pay | Admitting: *Deleted

## 2024-03-13 NOTE — Telephone Encounter (Signed)
 Keith Gibbs left a message saying he did not feel well this morning so he is not coming to [facility] today.  He asked for this therapist to call.  Therapist calls Keith Gibbs and he answers the phone. Therapist confirms his identity by obtaining two verifiers.  [name] says he is not feeling well.  He elaborates saying that he is really tired and sleepy and is having shortness of breath.  This therapist encourages him to go to an Urgent Care, ED or call his medical doctor having shortness of breath.  [name] says his mother stopped one of his medications and he puts her on the phone.  This therapist does not have a release to speak to [name].  Mother says that she stopped the medicine he is on for nightmares because she thinks it is that medication making him feel weird as it was the last medication prescribed to him.  She then says  is on 14 different medications and she thinks this is too many. Therapist explains that the medication providers and they prescribes medication and this therapist does not and cannot speak as to whether someone regarding starting or stopping a medication. Therapist suggests contacting the prescriber if there are concerns about [name]'s medications. Mother asks if she can come to [facility] psychiatric appointment on 03-21-24.  Therapist speaks in generalities saying that if a pt wants a family member to attend an appointment or part of an appointment with them and the family member agrees, there is usually not a problem with this issue.  Darice Simpler, MS, LMFT, LCAS

## 2024-03-13 NOTE — Telephone Encounter (Signed)
 Call report from [facility] Radiology:  IMPRESSION: 1. Enlarging spiculated right upper lobe nodule, highly worrisome for primary bronchogenic carcinoma. Lung-RADS 4X, highly suspicious. Additional imaging evaluation or consultation with Pulmonology or Thoracic Surgery recommended. These results will be called to the ordering clinician or representative by the Radiologist Assistant, and communication documented in the PACS or [employer]. 2.  Age advanced two vessel coronary artery calcification. 3. 4.0 cm ascending aortic aneurysm, stable. Recommend annual imaging followup by CTA or MRA. This recommendation follows 2010 ACCF/AHA/AATS/ACR/ASA/SCA/SCAI/SIR/STS/SVM Guidelines for the Diagnosis and Management of Patients with Thoracic Aortic Disease. Circulation. 2010; 121: [id number]. Aortic aneurysm NOS (ICD10-I71.9). 4.  Aortic atherosclerosis (ICD10-I70.0). 5. Enlarged pulmonic trunk, indicative of pulmonary arterial hypertension. 6.  Emphysema (ICD10-J43.9).

## 2024-03-13 NOTE — Progress Notes (Deleted)
 BH MD/PA/NP OP Progress Note  03/13/2024 9:35 AM Keith Gibbs  MRN:  [id number]  Assessment and Plan:  Patient seen today in person. He reports worsening anger in the context of tension with his mother who lives with him. He plans on buying a punching bag and an exercise machine to redirect his frustrations. He denies having physical outbursts but does report having increased irritability. He feels worthless and hopeless with his current situation of feeling stuck to live with his mother but also states he does not want to abandon her. He states wanting to start CDIOP to aid with his ongoing cocaine and alcohol  use. He has motivation to stop the substances at this time. He reports worsening paranoia as well regarding his neighbors spying on him. To aid with mood stabilization and paranoia, I will increase his Seroquel  to 300 mg. I also obtained labs to monitor for metabolic syndrome, reassuringly his prior A1c and lipid panel were unremarkable. Patient also voices having lung nodules with are currently being evaluated if they are cancerous and he states that he is at peace if they are cancerous. I will reassess patient in 5 weeks.  #Schizoaffective disorder, bipolar type #PTSD #Chronic pain -Increase Seroquel  to 300 mg nightly  - Lipid panel and A1c from 01/2024 reviewed and unremarkable -Change Depakote  250mg  daily and 1500mg  at bedtime  --10/2023- VPA level: 69  CMP: WNL -Continue Cymbalta  90 mg daily -Continue gabapentin  600 mg daily and 1200 mg nightly -Continue trazodone  100 mg nightly -Continue hydroxyzine  25 mg 2 times daily as needed   #Stimulant use d/o #Etoh use d/o #Cannabis use disorder, remission - Currently in CDIOP  #Tobacco use disorder - continue to monitor   Chief Complaint:  No chief complaint on file.  Identifying Information: Keith Gibbs is a 63 year old male with a past psychiatric history of schizoaffective, cocaine use disorder, alcohol  use disorder;  numerous hospitalizations due to substance intoxication/withdrawal, suicidal thoughts/attempts, mania, psychosis; numerous suicide attempts most recent required medical and psychiatric hospitalization in 07/2022 and a PMH of COPD, CVA, CAD, GERD, HTN, arthritis, and chronic pain.   Subjective: Patient seen ***.  Patient reports feeling *** today. Since the previous visit, ***. Regarding medications, patient notes ***. Patient reports the following adverse effects: ***.   Patient reports *** sleep, ***. Patient reports *** appetite, ***.   Patient denies current SI, HI, and AVH. ***  Tension with his mother***.   Visit Diagnosis:  No diagnosis found.  Past Psychiatric History Hospitalizations: 3x, first time in his 33s and most recently a few years ago in [place]; causes were from suicide attempts (hang himself (the belt broke), slit his wrist, OD)  Substance use:  Cocaine- since 22 years ago most recently one week, frequency - once every 3 months  Alcohol - once/3 weeks- two 40 oz beers at a time Smoking- now smoking 1 ppd  Family Psychiatric History: Sister: Depression, paternal grandma-depression and anxiety, multiple maternal family members: EtOH use disorder, paternal aunt: schizophrenia was hospitalized, another paternal aunt: depression and anxiety   Social History:  Living: live with mother in [place] Occupation: retired Relationship: widowed Children: yes 3 - ages 38 and 19 Support: religion Legal History: numerous times in prison- decades ago  Past Medical History:  Past Medical History:  Diagnosis Date   Anginal pain (HCC)    Anxiety    Arthritis    Bipolar 1 disorder (HCC)    Bursitis    CAD (coronary artery disease)    Chronic  pain    COPD (chronic obstructive pulmonary disease) (HCC)    Current use of long term anticoagulation    DAPT (ASA + clopidogrel )   Depression    Diverticulitis    Dyspnea    GERD (gastroesophageal reflux disease)    Grade I diastolic  dysfunction    Hepatitis C 2012   No longer has Hep C   HLD (hyperlipidemia)    Hypertension    MI (myocardial infarction) (HCC)    Polysubstance abuse (HCC)    cocaine, marijuana, ETOH   PUD (peptic ulcer disease)    S/P angioplasty with stent 06/10/2016   a.) 90% stenosis of pLAD to mLAD - 2.5 x 18 mm Xience Alpine (DES x 1) placed to pLAD   S/P PTCA (percutaneous transluminal coronary angioplasty) 12/04/2019   a.) 60% in stent restenosis of DES to pLAD; LVEF 65%.   Schizophrenia (HCC)    Stroke [facility])    Valvular insufficiency    a.) Mild MR, TR, PR; mild to moderate AR on 03/05/2018 TTE    Past Surgical History:  Procedure Laterality Date   ABDOMINAL SURGERY     removed small piece of intestines due to [facility] Diverticulosis   ANTERIOR LAT LUMBAR FUSION N/A 08/23/2022   Procedure: DIRECT LATERAL INTERBODY FUSION  LUMBAR TWO- LUMBAR THREE, LUMBAR THREE-LUMBAR FOUR , EXPLORE AND EXTEND FUSION LUMBAR TWO-LUMBAR FIVE, POSTERIOR DECOMPRESSION LUMBAR THREE-LUMBAR FOUR, LEFT LUMBAR TWO-LUMBAR THREE;  Surgeon: Debby Dorn MATSU, MD;  Location: [facility] OR;  Service: Neurosurgery;  Laterality: N/A;   APPENDECTOMY     BACK SURGERY     CARDIAC CATHETERIZATION Left 06/10/2016   Procedure: Left Heart Cath and Coronary Angiography;  Surgeon: Denyse DELENA Bathe, MD;  Location: [facility] INVASIVE CV LAB;  Service: Cardiovascular;  Laterality: Left;   CARDIAC CATHETERIZATION N/A 06/10/2016   Procedure: Coronary Stent Intervention;  Surgeon: Cara JONETTA Lovelace, MD;  Location: [facility] INVASIVE CV LAB;  Service: Cardiovascular;  Laterality: N/A;   CHOLECYSTECTOMY N/A 02/17/2022   Procedure: LAPAROSCOPIC CHOLECYSTECTOMY;  Surgeon: Stevie Herlene Righter, MD;  Location: [facility] OR;  Service: General;  Laterality: N/A;   COLON SURGERY     COLONOSCOPY     COLONOSCOPY WITH PROPOFOL  N/A 01/05/2017   Procedure: COLONOSCOPY WITH PROPOFOL ;  Surgeon: Therisa Bi, MD;  Location: [facility] ENDOSCOPY;  Service: Endoscopy;  Laterality: N/A;    COLONOSCOPY WITH PROPOFOL  N/A 02/13/2020   Procedure: COLONOSCOPY WITH PROPOFOL ;  Surgeon: Janalyn Keene NOVAK, MD;  Location: [facility] ENDOSCOPY;  Service: Endoscopy;  Laterality: N/A;   CORONARY ANGIOPLASTY WITH STENT PLACEMENT     CORONARY PRESSURE/FFR STUDY N/A 12/04/2019   Procedure: INTRAVASCULAR PRESSURE WIRE/FFR STUDY;  Surgeon: Wonda Sharper, MD;  Location: [facility] INVASIVE CV LAB;  Service: Cardiovascular;  Laterality: N/A;   ESOPHAGOGASTRODUODENOSCOPY (EGD) WITH PROPOFOL  N/A 01/05/2017   Procedure: ESOPHAGOGASTRODUODENOSCOPY (EGD) WITH PROPOFOL ;  Surgeon: Therisa Bi, MD;  Location: [facility] ENDOSCOPY;  Service: Endoscopy;  Laterality: N/A;   ESOPHAGOGASTRODUODENOSCOPY (EGD) WITH PROPOFOL  N/A 02/13/2020   Procedure: ESOPHAGOGASTRODUODENOSCOPY (EGD) WITH PROPOFOL ;  Surgeon: Janalyn Keene NOVAK, MD;  Location: [facility] ENDOSCOPY;  Service: Endoscopy;  Laterality: N/A;   KNEE ARTHROSCOPY WITH MEDIAL MENISECTOMY Right 09/05/2017   Procedure: KNEE ARTHROSCOPY WITH MEDIAL AND LATERAL  MENISECTOMY PARTIAL SYNOVECTOMY;  Surgeon: Kathlynn Sharper, MD;  Location: [facility] ORS;  Service: Orthopedics;  Laterality: Right;   LEFT HEART CATH AND CORONARY ANGIOGRAPHY N/A 12/04/2019   Procedure: LEFT HEART CATH AND CORONARY ANGIOGRAPHY;  Surgeon: Wonda Sharper, MD;  Location: [facility] INVASIVE CV LAB;  Service: Cardiovascular;  Laterality: N/A;   LEFT HEART CATH AND CORONARY ANGIOGRAPHY Left 11/25/2022   Procedure: LEFT HEART CATH AND CORONARY ANGIOGRAPHY;  Surgeon: Fernand Denyse LABOR, MD;  Location: [facility] INVASIVE CV LAB;  Service: Cardiovascular;  Laterality: Left;   REVERSE SHOULDER ARTHROPLASTY Right 08/31/2023   Procedure: RIGHT REVERSE SHOULDER ARTHROPLASTY;  Surgeon: [name], MD;  Location: [facility] OR;  Service: Orthopedics;  Laterality: Right;   SHOULDER SURGERY Right 04/09/2012   SPINE SURGERY     TOTAL KNEE ARTHROPLASTY Right 01/19/2021   Procedure: TOTAL KNEE ARTHROPLASTY - Medford Amber to Assist;  Surgeon: Kathlynn Sharper, MD;  Location: [facility] ORS;  Service: Orthopedics;  Laterality: Right;    Family History:  Family History  Problem Relation Age of Onset   Osteoarthritis Mother    Heart disease Mother    Hypertension Mother    Depression Mother    Heart disease Father    Early death Father    Hypertension Father    Heart attack Father    Hypertension Sister    Parkinson's disease Maternal Grandfather    Prostate cancer Neg Hx    Bladder Cancer Neg Hx    Kidney cancer Neg Hx    Tremor Neg Hx      Social History   Socioeconomic History   Marital status: Widowed    Spouse name: Not on file   Number of children: Not on file   Years of education: Not on file   Highest education level: GED or equivalent  Occupational History   Occupation: disability   Occupation: stocking at gas station    Comment: 2 days per week  Tobacco Use   Smoking status: Former    Current packs/day: 1.50    Average packs/day: 1.5 packs/day for 42.4 years (63.6 ttl pk-yrs)    Types: Cigarettes    Start date: 10/26/1978    Quit date: 2000    Passive exposure: Current   Smokeless tobacco: Never  Vaping Use   Vaping status: Never Used  Substance and Sexual Activity   Alcohol  use: Not Currently    Alcohol /week: 3.0 standard drinks of alcohol     Types: 3 Cans of beer per week    Comment: occassionally    Drug use: Not Currently    Types: Cocaine, Marijuana    Comment: last on saturday   Sexual activity: Not Currently    Partners: Female    Birth control/protection: None  Other Topics Concern   Not on file  Social History Narrative   Not on file   Social Drivers of Health   Financial Resource Strain: High Risk (09/19/2023)   Overall Financial Resource Strain (CARDIA)    Difficulty of Paying Living Expenses: Hard  Food Insecurity: Food Insecurity Present (09/19/2023)   Hunger Vital Sign    Worried About Running Out of Food in the Last Year: Often true    Ran Out of Food in the Last Year: Sometimes  true  Transportation Needs: No Transportation Needs (09/19/2023)   PRAPARE - Administrator, Civil Service (Medical): No    Lack of Transportation (Non-Medical): No  Physical Activity: Unknown (09/19/2023)   Exercise Vital Sign    Days of Exercise per Week: 0 days    Minutes of Exercise per Session: Not on file  Stress: Stress Concern Present (09/19/2023)   [employer] of Occupational Health - Occupational Stress Questionnaire    Feeling of Stress : Rather much  Social Connections: Moderately Isolated (09/19/2023)  Social Connection and Isolation Panel    Frequency of Communication with Friends and Family: More than three times a week    Frequency of Social Gatherings with Friends and Family: More than three times a week    Attends Religious Services: More than 4 times per year    Active Member of [employer] or Organizations: No    Attends Banker Meetings: Not on file    Marital Status: Widowed    Allergies:  Allergies  Allergen Reactions   Asenapine Other (See Comments) and Nausea And Vomiting    Increased tremors   Dextromethorphan Hbr    Guaifenesin     Latuda [Lurasidone Hcl] Other (See Comments)    Tremors     Lurasidone Other (See Comments) and Hives    Other Reaction(s): Angioedema   Phenylephrine      Metabolic Disorder Labs: Lab Results  Component Value Date   HGBA1C 5.3 02/12/2024   MPG 102.54 04/26/2022   MPG 103 12/28/2016   No results found for: PROLACTIN Lab Results  Component Value Date   CHOL 99 (L) 02/12/2024   TRIG 122 02/12/2024   HDL 29 (L) 02/12/2024   CHOLHDL 3.4 02/12/2024   VLDL 10 04/26/2022   LDLCALC 48 02/12/2024   LDLCALC 45 04/26/2022   Lab Results  Component Value Date   TSH 1.970 02/13/2023   TSH 0.420 08/03/2022    Therapeutic Level Labs: No results found for: LITHIUM Lab Results  Component Value Date   VALPROATE 69 11/15/2023   VALPROATE 51 06/28/2023   No results found for:  CBMZ  Current Medications: Current Outpatient Medications  Medication Sig Dispense Refill   acetaminophen  (TYLENOL ) 325 MG tablet Take 1 tablet (325 mg total) by mouth every 6 (six) hours as needed for mild pain (pain score 1-3) (or temp > 100.5). 60 tablet 0   albuterol  (VENTOLIN  HFA) 108 (90 Base) MCG/ACT inhaler Inhale 2 puffs into the lungs every 6 (six) hours as needed for wheezing or shortness of breath. 18 g 2   aspirin  EC 81 MG tablet Take 1 tablet (81 mg total) by mouth daily. Swallow whole. 30 tablet 0   atorvastatin  (LIPITOR ) 80 MG tablet TAKE 1 TABLET BY MOUTH EVERY DAY 90 tablet 3   budesonide -glycopyrrolate -formoterol  (BREZTRI  AEROSPHERE) 160-9-4.8 MCG/ACT AERO inhaler Inhale 2 puffs into the lungs in the morning and at bedtime. 1 each 2   celecoxib  (CELEBREX ) 100 MG capsule Take 2 capsules (200 mg total) by mouth 2 (two) times daily. 60 capsule 1   divalproex  (DEPAKOTE ) 500 MG DR tablet Take half tablet in the morning and 3 tablets at bedtime 105 tablet 2   docusate sodium  (COLACE) 100 MG capsule Take 1 capsule (100 mg total) by mouth 2 (two) times daily. (Patient not taking: Reported on 02/16/2024) 10 capsule 0   doxycycline  (VIBRA -TABS) 100 MG tablet Take 200 mg by mouth once.     DULoxetine  (CYMBALTA ) 30 MG capsule Take 3 capsules (90 mg total) by mouth daily. 270 capsule 0   gabapentin  (NEURONTIN ) 600 MG tablet Take 1 tablet (600 mg total) by mouth daily AND 2 tablets (1,200 mg total) at bedtime. 90 tablet 1   hydrOXYzine  (ATARAX ) 25 MG tablet Take 1 tablet (25 mg total) by mouth 2 (two) times daily as needed. 270 tablet 0   isosorbide  mononitrate (IMDUR ) 30 MG 24 hr tablet Take 1 tablet (30 mg total) by mouth 2 (two) times daily. 30 tablet 1   nitroGLYCERIN  (NITROSTAT ) 0.4 MG SL  tablet Place 1 tablet (0.4 mg total) under the tongue every 5 (five) minutes as needed for chest pain. 30 tablet 0   oxyCODONE  (OXY IR/ROXICODONE ) 5 MG immediate release tablet Take 1 tablet (5 mg  total) by mouth every 12 (twelve) hours as needed for moderate pain (pain score 4-6) (pain score 4-6). (Patient not taking: Reported on 02/16/2024) 30 tablet 0   oxyCODONE  (OXY IR/ROXICODONE ) 5 MG immediate release tablet Take 1 tablet (5 mg total) by mouth every 12 (twelve) hours as needed for severe pain (pain score 7-10). (Patient not taking: Reported on 02/16/2024) 25 tablet 0   pantoprazole  (PROTONIX ) 40 MG tablet Take 1 tablet (40 mg total) by mouth daily. 90 tablet 0   prazosin  (MINIPRESS ) 2 MG capsule Take 1 capsule (2 mg total) by mouth at bedtime. 30 capsule 2   QUEtiapine  (SEROQUEL ) 200 MG tablet Take 1.5 tablets (300 mg total) by mouth at bedtime. 90 tablet 0   traZODone  (DESYREL ) 100 MG tablet Take 1 tablet (100 mg total) by mouth at bedtime. 90 tablet 0   No current facility-administered medications for this visit.   Objective:  Psychiatric Specialty Exam: General Appearance: appears at stated age, casually dressed and groomed ***  Behavior: pleasant and cooperative ***  Psychomotor Activity: no psychomotor agitation or retardation noted ***  Eye Contact: fair *** Speech: normal amount, volume and fluency ***   Mood: euthymic *** Affect: congruent, pleasant and interactive ***  Thought Process: linear, goal directed, no circumstantial or tangential thought process noted, no racing thoughts or flight of ideas *** Descriptions of Associations: intact ***  Thought Content Hallucinations: denies AH, VH , does not appear responding to stimuli *** Delusions: no paranoia, delusions of control, grandeur, ideas of reference, thought broadcasting, and magical thinking *** Suicidal Thoughts: denies SI, intention, plan *** Homicidal Thoughts: denies HI, intention, plan ***  Alertness/Orientation: alert and fully oriented ***  Insight: fair*** Judgment: fair***  Memory: intact ***  Executive Functions  Concentration: intact *** Attention Span: fair *** Recall: intact  *** Fund of Knowledge: fair ***  Physical Exam *** General: Pleasant, well-appearing ***. No acute distress. Pulmonary: Normal effort. No wheezing or rales. Skin: No obvious rash or lesions. Neuro: A&Ox3.No focal deficit.  Review of Systems *** No reported symptoms    Screenings: AIMS    Flowsheet Row [facility]) [facility] (Discharged) [facility] 12/27/2016 in [facility]  AIMS Total Score 0 1   AUDIT    Flowsheet Row [facility] ([facility]) [facility] Admission (Discharged) from 12/27/2016 in [facility] INPATIENT BE[facility]AV[facility]AL MEDICINE  Alcohol  Use Disorder Identification Test Final Score (AUDIT) 4 0 32   GAD-7    Flowsheet Row Office Visit from 12/19/2023 in [facility] at [facility] Office Visit from 03/15/2023 in [facility] Care at [facility] Video Visit from 08/01/2022 in [facility] from 06/03/2019 in [facility] [place]  Total GAD-7 Score 14 6 20 14    PHQ2-9    Flowsheet Row Office Visit from 12/19/2023 in [facility] Primary Care at [facility] Office Visit from 03/15/2023 in [facility] Care at [facility] Office Visit from 12/13/2022 in [facility] Primary Care at [facility] Visit from 09/05/2022 in [facility]  Primary Care at [facility] Video Visit from 08/01/2022 in [facility]  PHQ-2 Total Score 4 0 0 0 6  PHQ-9 Total Score 8 2 0 -- 22   Flowsheet Row Admission (Discharged) from 08/31/2023 in [facility] Most recent reading at 08/31/2023  9:19 AM ED from 05/30/2023 in [facility] Emergency Department at [facility] Most recent  reading at 05/30/2023 10:55 AM UC from 05/30/2023 in [facility] at [facility]) Most recent reading at 05/30/2023  9:55 AM  C-SSRS RISK CATEGORY No Risk No Risk No Risk   Ismael Franco, MD PGY-3 Psychiatry Resident

## 2024-03-13 NOTE — Telephone Encounter (Signed)
 I attempted to call the patient with the results of his low-dose screening CT.  His scan was read as a lung RADS 4X.  This is a 35-month follow-up from a lung RADS 4A that was done in May 2025.  The spiculated right upper lobe nodule has enlarged from 10.5 mm on 11/28/2018 25 to 13.4 mm 02/29/2024. This is a 3 mm growth in 3 months.   Karna Karna [place], and [place] please see who has earliest availability between Dr. Shelah, Dr. Kara, myself, or one of the other 2 new physicians to manage pulmonary nodules to see the patient as soon as possible. He most likely need to be set up for bronchoscopy with biopsies. Thanks so much  Pt. Was in the shower. I spoke with his mother Slater , who is on his DPR. She understands the nodule of concern has grown and he needs to be seen by pulmonary doctor to determine next best steps in plan of care.  She said that she would relay this information to him.  I told her to give us  a call if he has any further questions or concerns, but they should be hearing from the office in regard to setting him up for an appointment to be seen by one of the providers.   Thank you so much, we also ned to fax results to PCP and let them know the plan of care.

## 2024-03-14 ENCOUNTER — Encounter (HOSPITAL_COMMUNITY): Payer: Self-pay | Admitting: Medical

## 2024-03-14 LAB — TOXICOLOGY SCREEN, URINE: Creatinine, POC: 175 mg/dL

## 2024-03-14 NOTE — Telephone Encounter (Signed)
 Spoke with patient. He is agreeable to being seen at the [place] office. Appt made with Dr Isadora 03/20/24 2:30. Pt verbalized understanding and had no further questions.

## 2024-03-15 ENCOUNTER — Ambulatory Visit (INDEPENDENT_AMBULATORY_CARE_PROVIDER_SITE_OTHER): Payer: MEDICAID | Admitting: Licensed Clinical Social Worker

## 2024-03-15 ENCOUNTER — Ambulatory Visit (HOSPITAL_COMMUNITY): Payer: MEDICAID

## 2024-03-15 DIAGNOSIS — F4310 Post-traumatic stress disorder, unspecified: Secondary | ICD-10-CM

## 2024-03-15 DIAGNOSIS — F250 Schizoaffective disorder, bipolar type: Secondary | ICD-10-CM

## 2024-03-15 DIAGNOSIS — F1091 Alcohol use, unspecified, in remission: Secondary | ICD-10-CM

## 2024-03-15 DIAGNOSIS — F17210 Nicotine dependence, cigarettes, uncomplicated: Secondary | ICD-10-CM

## 2024-03-15 DIAGNOSIS — F1420 Cocaine dependence, uncomplicated: Secondary | ICD-10-CM | POA: Diagnosis not present

## 2024-03-15 DIAGNOSIS — F1290 Cannabis use, unspecified, uncomplicated: Secondary | ICD-10-CM

## 2024-03-15 NOTE — Progress Notes (Signed)
 Daily Group Progress Note   Program: CD IOP     Group Time: 9 a.m. to 12 p.m.      Type of Therapy: Process and Psychoeducational    Topic: The therapists check in with group members, assess for SI/HI/psychosis and overall level of functioning. The therapists inquire about sobriety date and number of community support meetings attended since last session.   The therapists present the 2nd half of the video on Breaking the Addiction Cycle emphasizing that people in recovery can do nothing about preoccupation; however, they are able to change their behavior via understanding and then interrupting their using ritual. The therapists give group members the homework of being prepared to detail their using rituals on Monday.  The therapists address the issue of recovery drift and how to be aware of it and to avoid it.  The therapists encouraged group members to prioritize their recovery and not say that they will try to get to a meeting but plan the meetings that they will be attending.  The therapists address the topic of emotions and encounter belief that there are such a thing as bad emotions are unhealthy emotions.  The therapist explain that all emotions are useful and serve as indicators in helping people to navigate life circumstances.   Summary: Keith Gibbs presents today rating both his depression and anxiety as a 5.  He describes his mood as being tired, weak, disoriented, off balance, GERD, and a little bit of anger.  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  Keith Gibbs says that he is angry at himself for not having quit smoking cigarettes and the fact that he continues to smoke.  He talks about the fact that he refused chemotherapy based on the fact that he has not seen this work and his wife or other family members who have died from  cancer.  In spite of this new diagnosis, reports that he just picked up his 30-day chip and that he has continued to work with his sponsor who has encouraged Keith Gibbs to continue to take things 1 day at a time.  Keith Gibbs says that he took in only about half of what was discussed today in the group and that his mind has been preoccupied.  He does say that he took notes so that he can go back and review it.  He is encouraged to call his doctor or seek medical attention if he continues to feel off balance.  Progress Towards Goals: Keith Gibbs reports no change in his sobriety date.   UDS collected: No Results: Yes, negative for drugs and alcohol  except for nicotine    AA/NA attended?:  Yes   Sponsor?:  Yes     Elsie Maier, MA, LCSW, [facility], LCAS Darice Simpler, LMFT, LCAS 03/15/2024

## 2024-03-18 ENCOUNTER — Ambulatory Visit (INDEPENDENT_AMBULATORY_CARE_PROVIDER_SITE_OTHER): Payer: MEDICAID | Admitting: Cardiovascular Disease

## 2024-03-18 ENCOUNTER — Encounter: Payer: Self-pay | Admitting: Cardiovascular Disease

## 2024-03-18 ENCOUNTER — Ambulatory Visit (HOSPITAL_COMMUNITY): Payer: MEDICAID

## 2024-03-18 ENCOUNTER — Encounter (HOSPITAL_COMMUNITY): Payer: Self-pay | Admitting: Medical

## 2024-03-18 ENCOUNTER — Ambulatory Visit: Payer: MEDICAID | Admitting: Cardiovascular Disease

## 2024-03-18 ENCOUNTER — Ambulatory Visit (INDEPENDENT_AMBULATORY_CARE_PROVIDER_SITE_OTHER): Payer: MEDICAID | Admitting: Medical

## 2024-03-18 VITALS — BP 120/74 | HR 67 | Ht 75.0 in | Wt 233.0 lb

## 2024-03-18 DIAGNOSIS — F1091 Alcohol use, unspecified, in remission: Secondary | ICD-10-CM

## 2024-03-18 DIAGNOSIS — F1111 Opioid abuse, in remission: Secondary | ICD-10-CM

## 2024-03-18 DIAGNOSIS — I351 Nonrheumatic aortic (valve) insufficiency: Secondary | ICD-10-CM

## 2024-03-18 DIAGNOSIS — F250 Schizoaffective disorder, bipolar type: Secondary | ICD-10-CM

## 2024-03-18 DIAGNOSIS — I2510 Atherosclerotic heart disease of native coronary artery without angina pectoris: Secondary | ICD-10-CM

## 2024-03-18 DIAGNOSIS — F1420 Cocaine dependence, uncomplicated: Secondary | ICD-10-CM

## 2024-03-18 DIAGNOSIS — F1290 Cannabis use, unspecified, uncomplicated: Secondary | ICD-10-CM

## 2024-03-18 DIAGNOSIS — R911 Solitary pulmonary nodule: Secondary | ICD-10-CM

## 2024-03-18 DIAGNOSIS — F4321 Adjustment disorder with depressed mood: Secondary | ICD-10-CM

## 2024-03-18 DIAGNOSIS — I10 Essential (primary) hypertension: Secondary | ICD-10-CM

## 2024-03-18 DIAGNOSIS — F17210 Nicotine dependence, cigarettes, uncomplicated: Secondary | ICD-10-CM

## 2024-03-18 DIAGNOSIS — F411 Generalized anxiety disorder: Secondary | ICD-10-CM

## 2024-03-18 DIAGNOSIS — I340 Nonrheumatic mitral (valve) insufficiency: Secondary | ICD-10-CM

## 2024-03-18 DIAGNOSIS — G894 Chronic pain syndrome: Secondary | ICD-10-CM

## 2024-03-18 DIAGNOSIS — Z9861 Coronary angioplasty status: Secondary | ICD-10-CM

## 2024-03-18 DIAGNOSIS — F4310 Post-traumatic stress disorder, unspecified: Secondary | ICD-10-CM

## 2024-03-18 DIAGNOSIS — E782 Mixed hyperlipidemia: Secondary | ICD-10-CM

## 2024-03-18 DIAGNOSIS — F1011 Alcohol abuse, in remission: Secondary | ICD-10-CM

## 2024-03-18 DIAGNOSIS — Z9189 Other specified personal risk factors, not elsewhere classified: Secondary | ICD-10-CM
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
## 2024-03-18 NOTE — Progress Notes (Signed)
 Cardiology Office Note   Date:  03/18/2024   ID:  Kwan, Shellhammer 05-29-1961, MRN [id number]  PCP:  Lorren Greig PARAS, NP  Cardiologist:  Denyse Bathe, MD      History of Present Illness: Keith Gibbs is a 63 y.o. male who presents for  Chief Complaint  Patient presents with   Follow-up    1 month follow up    Lung nodule growing, no chest pain but has occasional SOB.      Past Medical History:  Diagnosis Date   Anginal pain (HCC)    Anxiety    Arthritis    Bipolar 1 disorder (HCC)    Bursitis    CAD (coronary artery disease)    Chronic pain    COPD (chronic obstructive pulmonary disease) (HCC)    Current use of long term anticoagulation    DAPT (ASA + clopidogrel )   Depression    Diverticulitis    Dyspnea    GERD (gastroesophageal reflux disease)    Grade I diastolic dysfunction    Hepatitis C 2012   No longer has Hep C   HLD (hyperlipidemia)    Hypertension    MI (myocardial infarction) (HCC)    Polysubstance abuse (HCC)    cocaine, marijuana, ETOH   PUD (peptic ulcer disease)    S/P angioplasty with stent 06/10/2016   a.) 90% stenosis of pLAD to mLAD - 2.5 x 18 mm Xience Alpine (DES x 1) placed to pLAD   S/P PTCA (percutaneous transluminal coronary angioplasty) 12/04/2019   a.) 60% in stent restenosis of DES to pLAD; LVEF 65%.   Schizophrenia (HCC)    Stroke [facility])    Valvular insufficiency    a.) Mild MR, TR, PR; mild to moderate AR on 03/05/2018 TTE     Past Surgical History:  Procedure Laterality Date   ABDOMINAL SURGERY     removed small piece of intestines due to [facility] Diverticulosis   ANTERIOR LAT LUMBAR FUSION N/A 08/23/2022   Procedure: DIRECT LATERAL INTERBODY FUSION  LUMBAR TWO- LUMBAR THREE, LUMBAR THREE-LUMBAR FOUR , EXPLORE AND EXTEND FUSION LUMBAR TWO-LUMBAR FIVE, POSTERIOR DECOMPRESSION LUMBAR THREE-LUMBAR FOUR, LEFT LUMBAR TWO-LUMBAR THREE;  Surgeon: Debby Dorn MATSU, MD;  Location: [facility] OR;  Service: Neurosurgery;   Laterality: N/A;   APPENDECTOMY     BACK SURGERY     CARDIAC CATHETERIZATION Left 06/10/2016   Procedure: Left Heart Cath and Coronary Angiography;  Surgeon: Denyse DELENA Bathe, MD;  Location: [facility] INVASIVE CV LAB;  Service: Cardiovascular;  Laterality: Left;   CARDIAC CATHETERIZATION N/A 06/10/2016   Procedure: Coronary Stent Intervention;  Surgeon: Cara JONETTA Lovelace, MD;  Location: [facility] INVASIVE CV LAB;  Service: Cardiovascular;  Laterality: N/A;   CHOLECYSTECTOMY N/A 02/17/2022   Procedure: LAPAROSCOPIC CHOLECYSTECTOMY;  Surgeon: Stevie Herlene Righter, MD;  Location: [facility] OR;  Service: General;  Laterality: N/A;   COLON SURGERY     COLONOSCOPY     COLONOSCOPY WITH PROPOFOL  N/A 01/05/2017   Procedure: COLONOSCOPY WITH PROPOFOL ;  Surgeon: Therisa Bi, MD;  Location: [facility] ENDOSCOPY;  Service: Endoscopy;  Laterality: N/A;   COLONOSCOPY WITH PROPOFOL  N/A 02/13/2020   Procedure: COLONOSCOPY WITH PROPOFOL ;  Surgeon: Janalyn Keene NOVAK, MD;  Location: [facility] ENDOSCOPY;  Service: Endoscopy;  Laterality: N/A;   CORONARY ANGIOPLASTY WITH STENT PLACEMENT     CORONARY PRESSURE/FFR STUDY N/A 12/04/2019   Procedure: INTRAVASCULAR PRESSURE WIRE/FFR STUDY;  Surgeon: Wonda Sharper, MD;  Location: [facility] INVASIVE CV LAB;  Service: Cardiovascular;  Laterality:  N/A;   ESOPHAGOGASTRODUODENOSCOPY (EGD) WITH PROPOFOL  N/A 01/05/2017   Procedure: ESOPHAGOGASTRODUODENOSCOPY (EGD) WITH PROPOFOL ;  Surgeon: Therisa Bi, MD;  Location: [facility] ENDOSCOPY;  Service: Endoscopy;  Laterality: N/A;   ESOPHAGOGASTRODUODENOSCOPY (EGD) WITH PROPOFOL  N/A 02/13/2020   Procedure: ESOPHAGOGASTRODUODENOSCOPY (EGD) WITH PROPOFOL ;  Surgeon: Janalyn Keene NOVAK, MD;  Location: [facility] ENDOSCOPY;  Service: Endoscopy;  Laterality: N/A;   KNEE ARTHROSCOPY WITH MEDIAL MENISECTOMY Right 09/05/2017   Procedure: KNEE ARTHROSCOPY WITH MEDIAL AND LATERAL  MENISECTOMY PARTIAL SYNOVECTOMY;  Surgeon: Kathlynn Sharper, MD;  Location: [facility] ORS;  Service: Orthopedics;   Laterality: Right;   LEFT HEART CATH AND CORONARY ANGIOGRAPHY N/A 12/04/2019   Procedure: LEFT HEART CATH AND CORONARY ANGIOGRAPHY;  Surgeon: Wonda Sharper, MD;  Location: [facility] INVASIVE CV LAB;  Service: Cardiovascular;  Laterality: N/A;   LEFT HEART CATH AND CORONARY ANGIOGRAPHY Left 11/25/2022   Procedure: LEFT HEART CATH AND CORONARY ANGIOGRAPHY;  Surgeon: Fernand Denyse LABOR, MD;  Location: [facility] INVASIVE CV LAB;  Service: Cardiovascular;  Laterality: Left;   REVERSE SHOULDER ARTHROPLASTY Right 08/31/2023   Procedure: RIGHT REVERSE SHOULDER ARTHROPLASTY;  Surgeon: [name], MD;  Location: [facility] OR;  Service: Orthopedics;  Laterality: Right;   SHOULDER SURGERY Right 04/09/2012   SPINE SURGERY     TOTAL KNEE ARTHROPLASTY Right 01/19/2021   Procedure: TOTAL KNEE ARTHROPLASTY - [place] Amber to Assist;  Surgeon: Kathlynn Sharper, MD;  Location: [facility] ORS;  Service: Orthopedics;  Laterality: Right;     Current Outpatient Medications  Medication Sig Dispense Refill   acetaminophen  (TYLENOL ) 325 MG tablet Take 1 tablet (325 mg total) by mouth every 6 (six) hours as needed for mild pain (pain score 1-3) (or temp > 100.5). 60 tablet 0   albuterol  (VENTOLIN  HFA) 108 (90 Base) MCG/ACT inhaler Inhale 2 puffs into the lungs every 6 (six) hours as needed for wheezing or shortness of breath. 18 g 2   aspirin  EC 81 MG tablet Take 1 tablet (81 mg total) by mouth daily. Swallow whole. 30 tablet 0   atorvastatin  (LIPITOR ) 80 MG tablet TAKE 1 TABLET BY MOUTH EVERY DAY 90 tablet 3   budesonide -glycopyrrolate -formoterol  (BREZTRI  AEROSPHERE) 160-9-4.8 MCG/ACT AERO inhaler Inhale 2 puffs into the lungs in the morning and at bedtime. 1 each 2   celecoxib  (CELEBREX ) 100 MG capsule Take 2 capsules (200 mg total) by mouth 2 (two) times daily. 60 capsule 1   divalproex  (DEPAKOTE ) 500 MG DR tablet Take half tablet in the morning and 3 tablets at bedtime 105 tablet 2   doxycycline  (VIBRA -TABS) 100 MG tablet Take 200 mg by  mouth once.     DULoxetine  (CYMBALTA ) 30 MG capsule Take 3 capsules (90 mg total) by mouth daily. 270 capsule 0   gabapentin  (NEURONTIN ) 600 MG tablet Take 1 tablet (600 mg total) by mouth daily AND 2 tablets (1,200 mg total) at bedtime. 90 tablet 1   hydrOXYzine  (ATARAX ) 25 MG tablet Take 1 tablet (25 mg total) by mouth 2 (two) times daily as needed. 270 tablet 0   isosorbide  mononitrate (IMDUR ) 30 MG 24 hr tablet Take 1 tablet (30 mg total) by mouth 2 (two) times daily. 30 tablet 1   nitroGLYCERIN  (NITROSTAT ) 0.4 MG SL tablet Place 1 tablet (0.4 mg total) under the tongue every 5 (five) minutes as needed for chest pain. 30 tablet 0   pantoprazole  (PROTONIX ) 40 MG tablet Take 1 tablet (40 mg total) by mouth daily. 90 tablet 0   prazosin  (MINIPRESS ) 2 MG capsule Take 1 capsule (  2 mg total) by mouth at bedtime. 30 capsule 2   QUEtiapine  (SEROQUEL ) 200 MG tablet Take 1.5 tablets (300 mg total) by mouth at bedtime. 90 tablet 0   traZODone  (DESYREL ) 100 MG tablet Take 1 tablet (100 mg total) by mouth at bedtime. 90 tablet 0   No current facility-administered medications for this visit.    Allergies:   Asenapine, Dextromethorphan hbr, Guaifenesin , Latuda [lurasidone hcl], Lurasidone, and Phenylephrine     Social History:   reports that he quit smoking about 25 years ago. His smoking use included cigarettes. He started smoking about 45 years ago. He has a 63.6 pack-year smoking history. He has been exposed to tobacco smoke. He has never used smokeless tobacco. He reports that he does not currently use alcohol  after a past usage of about 3.0 standard drinks of alcohol  per week. He reports that he does not currently use drugs after having used the following drugs: Cocaine and Marijuana.   Family History:  family history includes Depression in his mother; Early death in his father; Heart attack in his father; Heart disease in his father and mother; Hypertension in his father, mother, and sister;  Osteoarthritis in his mother; Parkinson's disease in his maternal grandfather.    ROS:     Review of Systems  Constitutional: Negative.   HENT: Negative.    Eyes: Negative.   Respiratory: Negative.    Gastrointestinal: Negative.   Genitourinary: Negative.   Musculoskeletal: Negative.   Skin: Negative.   Neurological: Negative.   Endo/Heme/Allergies: Negative.   Psychiatric/Behavioral: Negative.    All other systems reviewed and are negative.     All other systems are reviewed and negative.    PHYSICAL EXAM: VS:  BP 120/74   Pulse 67   Ht 6' 3 (1.905 m)   Wt 233 lb (105.7 kg)   SpO2 95%   BMI 29.12 kg/m  , BMI Body mass index is 29.12 kg/m. Last weight:  Wt Readings from Last 3 Encounters:  03/18/24 233 lb (105.7 kg)  02/16/24 230 lb (104.3 kg)  01/01/24 229 lb (103.9 kg)     Physical Exam Vitals reviewed.  Constitutional:      Appearance: Normal appearance. He is normal weight.  HENT:     Head: Normocephalic.     Nose: Nose normal.     Mouth/Throat:     Mouth: Mucous membranes are moist.  Eyes:     Pupils: Pupils are equal, round, and reactive to light.  Cardiovascular:     Rate and Rhythm: Normal rate and regular rhythm.     Pulses: Normal pulses.     Heart sounds: Normal heart sounds.  Pulmonary:     Effort: Pulmonary effort is normal.  Abdominal:     General: Abdomen is flat. Bowel sounds are normal.  Musculoskeletal:        General: Normal range of motion.     Cervical back: Normal range of motion.  Skin:    General: Skin is warm.  Neurological:     General: No focal deficit present.     Mental Status: He is alert.  Psychiatric:        Mood and Affect: Mood normal.       EKG:   Recent Labs: 05/30/2023: B Natriuretic Peptide 34.3; Magnesium  1.6 08/24/2023: Hemoglobin 15.6; Platelets 165 11/15/2023: ALT 6; BUN 11; Creatinine, Ser 0.94; Potassium 4.9; Sodium 138    Lipid Panel    Component Value Date/Time   CHOL 99 (L) 02/12/2024 0840  TRIG 122 02/12/2024 0840   HDL 29 (L) 02/12/2024 0840   CHOLHDL 3.4 02/12/2024 0840   CHOLHDL 3.0 04/26/2022 1530   VLDL 10 04/26/2022 1530   LDLCALC 48 02/12/2024 0840      Other studies Reviewed: Additional studies/ records that were reviewed today include:  Review of the above records demonstrates:       No data to display            ASSESSMENT AND PLAN:    ICD-10-CM   1. CAD S/P percutaneous coronary angioplasty  I25.10    Z98.61    No chest pain, continue isososrbide .    2. Essential hypertension  I10     3. Pulmonary nodule  R91.1     4. Primary hypertension  I10     5. Mixed hyperlipidemia  E78.2     6. Nonrheumatic aortic valve insufficiency  I35.1     7. Nonrheumatic mitral valve regurgitation  I34.0        Problem List Items Addressed This Visit       Cardiovascular and Mediastinum   CAD S/P percutaneous coronary angioplasty - Primary (Chronic)   Essential hypertension     Respiratory   Pulmonary nodule   Other Visit Diagnoses       Primary hypertension         Mixed hyperlipidemia         Nonrheumatic aortic valve insufficiency         Nonrheumatic mitral valve regurgitation              Disposition:   Return in about 3 months (around 06/18/2024).    Total time spent: 30 minutes  Signed,  Denyse Bathe, MD  03/18/2024 3:08 PM    [facility]
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
## 2024-03-20 ENCOUNTER — Encounter (HOSPITAL_COMMUNITY): Payer: Self-pay

## 2024-03-20 ENCOUNTER — Other Ambulatory Visit: Payer: Self-pay

## 2024-03-20 ENCOUNTER — Inpatient Hospital Stay (HOSPITAL_COMMUNITY)
Admission: EM | Admit: 2024-03-20 | Discharge: 2024-03-23 | DRG: 309 | Disposition: A | Discharge status: Home / Self Care | Payer: MEDICAID | Source: Other Acute Inpatient Hospital | Attending: Internal Medicine | Admitting: Internal Medicine

## 2024-03-20 ENCOUNTER — Ambulatory Visit: Payer: MEDICAID | Admitting: Family

## 2024-03-20 ENCOUNTER — Ambulatory Visit (HOSPITAL_COMMUNITY): Payer: MEDICAID

## 2024-03-20 ENCOUNTER — Telehealth: Payer: Self-pay

## 2024-03-20 ENCOUNTER — Ambulatory Visit (INDEPENDENT_AMBULATORY_CARE_PROVIDER_SITE_OTHER): Payer: MEDICAID

## 2024-03-20 ENCOUNTER — Ambulatory Visit: Payer: MEDICAID | Admitting: Student in an Organized Health Care Education/Training Program

## 2024-03-20 ENCOUNTER — Emergency Department (HOSPITAL_COMMUNITY): Payer: MEDICAID

## 2024-03-20 DIAGNOSIS — E876 Hypokalemia: Secondary | ICD-10-CM | POA: Diagnosis not present

## 2024-03-20 DIAGNOSIS — F1420 Cocaine dependence, uncomplicated: Secondary | ICD-10-CM | POA: Diagnosis not present

## 2024-03-20 DIAGNOSIS — Z8673 Personal history of transient ischemic attack (TIA), and cerebral infarction without residual deficits: Secondary | ICD-10-CM

## 2024-03-20 DIAGNOSIS — N179 Acute kidney failure, unspecified: Principal | ICD-10-CM | POA: Diagnosis present

## 2024-03-20 DIAGNOSIS — C3411 Malignant neoplasm of upper lobe, right bronchus or lung: Secondary | ICD-10-CM | POA: Diagnosis present

## 2024-03-20 DIAGNOSIS — Z9049 Acquired absence of other specified parts of digestive tract: Secondary | ICD-10-CM

## 2024-03-20 DIAGNOSIS — I351 Nonrheumatic aortic (valve) insufficiency: Secondary | ICD-10-CM | POA: Diagnosis present

## 2024-03-20 DIAGNOSIS — F250 Schizoaffective disorder, bipolar type: Secondary | ICD-10-CM

## 2024-03-20 DIAGNOSIS — F259 Schizoaffective disorder, unspecified: Secondary | ICD-10-CM | POA: Diagnosis present

## 2024-03-20 DIAGNOSIS — M109 Gout, unspecified: Secondary | ICD-10-CM | POA: Diagnosis present

## 2024-03-20 DIAGNOSIS — Z96651 Presence of right artificial knee joint: Secondary | ICD-10-CM | POA: Diagnosis present

## 2024-03-20 DIAGNOSIS — Z818 Family history of other mental and behavioral disorders: Secondary | ICD-10-CM

## 2024-03-20 DIAGNOSIS — Z820 Family history of epilepsy and other diseases of the nervous system: Secondary | ICD-10-CM

## 2024-03-20 DIAGNOSIS — K7460 Unspecified cirrhosis of liver: Secondary | ICD-10-CM | POA: Diagnosis present

## 2024-03-20 DIAGNOSIS — Z9861 Coronary angioplasty status: Secondary | ICD-10-CM

## 2024-03-20 DIAGNOSIS — G894 Chronic pain syndrome: Secondary | ICD-10-CM | POA: Diagnosis present

## 2024-03-20 DIAGNOSIS — Z723 Lack of physical exercise: Secondary | ICD-10-CM

## 2024-03-20 DIAGNOSIS — R001 Bradycardia, unspecified: Principal | ICD-10-CM | POA: Diagnosis present

## 2024-03-20 DIAGNOSIS — G2581 Restless legs syndrome: Secondary | ICD-10-CM | POA: Diagnosis present

## 2024-03-20 DIAGNOSIS — F17210 Nicotine dependence, cigarettes, uncomplicated: Secondary | ICD-10-CM | POA: Diagnosis present

## 2024-03-20 DIAGNOSIS — D508 Other iron deficiency anemias: Secondary | ICD-10-CM | POA: Diagnosis present

## 2024-03-20 DIAGNOSIS — I5032 Chronic diastolic (congestive) heart failure: Secondary | ICD-10-CM | POA: Diagnosis present

## 2024-03-20 DIAGNOSIS — F4310 Post-traumatic stress disorder, unspecified: Secondary | ICD-10-CM

## 2024-03-20 DIAGNOSIS — F411 Generalized anxiety disorder: Secondary | ICD-10-CM

## 2024-03-20 DIAGNOSIS — F332 Major depressive disorder, recurrent severe without psychotic features: Secondary | ICD-10-CM | POA: Diagnosis present

## 2024-03-20 DIAGNOSIS — Z7982 Long term (current) use of aspirin: Secondary | ICD-10-CM

## 2024-03-20 DIAGNOSIS — Z96611 Presence of right artificial shoulder joint: Secondary | ICD-10-CM | POA: Diagnosis present

## 2024-03-20 DIAGNOSIS — Z7951 Long term (current) use of inhaled steroids: Secondary | ICD-10-CM

## 2024-03-20 DIAGNOSIS — Z79899 Other long term (current) drug therapy: Secondary | ICD-10-CM

## 2024-03-20 DIAGNOSIS — J449 Chronic obstructive pulmonary disease, unspecified: Secondary | ICD-10-CM

## 2024-03-20 DIAGNOSIS — I2510 Atherosclerotic heart disease of native coronary artery without angina pectoris: Secondary | ICD-10-CM | POA: Diagnosis present

## 2024-03-20 DIAGNOSIS — E871 Hypo-osmolality and hyponatremia: Secondary | ICD-10-CM | POA: Diagnosis present

## 2024-03-20 DIAGNOSIS — N400 Enlarged prostate without lower urinary tract symptoms: Secondary | ICD-10-CM | POA: Diagnosis present

## 2024-03-20 DIAGNOSIS — F1020 Alcohol dependence, uncomplicated: Secondary | ICD-10-CM | POA: Diagnosis present

## 2024-03-20 DIAGNOSIS — M1990 Unspecified osteoarthritis, unspecified site: Secondary | ICD-10-CM | POA: Diagnosis present

## 2024-03-20 DIAGNOSIS — Z7901 Long term (current) use of anticoagulants: Secondary | ICD-10-CM

## 2024-03-20 DIAGNOSIS — B182 Chronic viral hepatitis C: Secondary | ICD-10-CM | POA: Diagnosis present

## 2024-03-20 DIAGNOSIS — I110 Hypertensive heart disease with heart failure: Secondary | ICD-10-CM | POA: Diagnosis present

## 2024-03-20 DIAGNOSIS — F1290 Cannabis use, unspecified, uncomplicated: Secondary | ICD-10-CM

## 2024-03-20 DIAGNOSIS — Z5986 Financial insecurity: Secondary | ICD-10-CM

## 2024-03-20 DIAGNOSIS — I77810 Thoracic aortic ectasia: Secondary | ICD-10-CM | POA: Diagnosis present

## 2024-03-20 DIAGNOSIS — Z1152 Encounter for screening for COVID-19: Secondary | ICD-10-CM

## 2024-03-20 DIAGNOSIS — K7469 Other cirrhosis of liver: Secondary | ICD-10-CM

## 2024-03-20 DIAGNOSIS — Z888 Allergy status to other drugs, medicaments and biological substances status: Secondary | ICD-10-CM

## 2024-03-20 DIAGNOSIS — I252 Old myocardial infarction: Secondary | ICD-10-CM

## 2024-03-20 DIAGNOSIS — J441 Chronic obstructive pulmonary disease with (acute) exacerbation: Secondary | ICD-10-CM | POA: Diagnosis not present

## 2024-03-20 DIAGNOSIS — I10 Essential (primary) hypertension: Secondary | ICD-10-CM | POA: Diagnosis present

## 2024-03-20 DIAGNOSIS — F1490 Cocaine use, unspecified, uncomplicated: Secondary | ICD-10-CM | POA: Diagnosis present

## 2024-03-20 DIAGNOSIS — Z8249 Family history of ischemic heart disease and other diseases of the circulatory system: Secondary | ICD-10-CM

## 2024-03-20 DIAGNOSIS — Z955 Presence of coronary angioplasty implant and graft: Secondary | ICD-10-CM

## 2024-03-20 DIAGNOSIS — F319 Bipolar disorder, unspecified: Secondary | ICD-10-CM | POA: Diagnosis present

## 2024-03-20 DIAGNOSIS — K219 Gastro-esophageal reflux disease without esophagitis: Secondary | ICD-10-CM | POA: Diagnosis present

## 2024-03-20 DIAGNOSIS — F419 Anxiety disorder, unspecified: Secondary | ICD-10-CM | POA: Diagnosis present

## 2024-03-20 DIAGNOSIS — E7800 Pure hypercholesterolemia, unspecified: Secondary | ICD-10-CM | POA: Diagnosis present

## 2024-03-20 DIAGNOSIS — Z8619 Personal history of other infectious and parasitic diseases: Secondary | ICD-10-CM

## 2024-03-20 DIAGNOSIS — F1120 Opioid dependence, uncomplicated: Secondary | ICD-10-CM | POA: Diagnosis present

## 2024-03-20 DIAGNOSIS — Z8711 Personal history of peptic ulcer disease: Secondary | ICD-10-CM

## 2024-03-20 DIAGNOSIS — F1190 Opioid use, unspecified, uncomplicated: Secondary | ICD-10-CM | POA: Diagnosis present

## 2024-03-20 LAB — RESP PANEL BY RT-PCR (RSV, FLU A&B, COVID)  RVPGX2
Influenza A by PCR: NEGATIVE
Influenza B by PCR: NEGATIVE
Resp Syncytial Virus by PCR: NEGATIVE
SARS Coronavirus 2 by RT PCR: NEGATIVE

## 2024-03-20 LAB — RAPID URINE DRUG SCREEN, HOSP PERFORMED
Amphetamines: NOT DETECTED
Barbiturates: NOT DETECTED
Benzodiazepines: NOT DETECTED
Cocaine: NOT DETECTED
Opiates: NOT DETECTED
Tetrahydrocannabinol: NOT DETECTED

## 2024-03-20 LAB — BRAIN NATRIURETIC PEPTIDE: B Natriuretic Peptide: 20.6 pg/mL (ref 0.0–100.0)

## 2024-03-20 LAB — HIV ANTIBODY (ROUTINE TESTING W REFLEX): HIV Screen 4th Generation wRfx: NONREACTIVE

## 2024-03-20 LAB — BLOOD GAS, VENOUS
Acid-Base Excess: 3 mmol/L — ABNORMAL HIGH (ref 0.0–2.0)
Bicarbonate: 29 mmol/L — ABNORMAL HIGH (ref 20.0–28.0)
Drawn by: 8157
O2 Saturation: 80.8 %
Patient temperature: 36.3
pCO2, Ven: 48 mmHg (ref 44–60)
pH, Ven: 7.39 (ref 7.25–7.43)
pO2, Ven: 45 mmHg (ref 32–45)

## 2024-03-20 LAB — COMPREHENSIVE METABOLIC PANEL WITH GFR
ALT: 10 U/L (ref 0–44)
AST: 17 U/L (ref 15–41)
Albumin: 3.3 g/dL — ABNORMAL LOW (ref 3.5–5.0)
Alkaline Phosphatase: 48 U/L (ref 38–126)
Anion gap: 6 (ref 5–15)
BUN: 13 mg/dL (ref 8–23)
CO2: 24 mmol/L (ref 22–32)
Calcium: 8.5 mg/dL — ABNORMAL LOW (ref 8.9–10.3)
Chloride: 102 mmol/L (ref 98–111)
Creatinine, Ser: 0.96 mg/dL (ref 0.61–1.24)
GFR, Estimated: >60 mL/min (ref >60)
Glucose, Bld: 95 mg/dL (ref 70–99)
Potassium: 4.4 mmol/L (ref 3.5–5.1)
Sodium: 132 mmol/L — ABNORMAL LOW (ref 135–145)
Total Bilirubin: 0.8 mg/dL (ref 0.0–1.2)
Total Protein: 5.7 g/dL — ABNORMAL LOW (ref 6.5–8.1)

## 2024-03-20 LAB — CBC
HCT: 39.8 % (ref 39.0–52.0)
Hemoglobin: 13.6 g/dL (ref 13.0–17.0)
MCH: 33.1 pg (ref 26.0–34.0)
MCHC: 34.2 g/dL (ref 30.0–36.0)
MCV: 96.8 fL (ref 80.0–100.0)
Platelets: 141 K/uL — ABNORMAL LOW (ref 150–400)
RBC: 4.11 MIL/uL — ABNORMAL LOW (ref 4.22–5.81)
RDW: 13.5 % (ref 11.5–15.5)
WBC: 6.6 K/uL (ref 4.0–10.5)
nRBC: 0 % (ref 0.0–0.2)

## 2024-03-20 LAB — AMMONIA: Ammonia: 54 umol/L — ABNORMAL HIGH (ref 9–35)

## 2024-03-20 LAB — ETHANOL: Alcohol, Ethyl (B): <15 mg/dL (ref <15)

## 2024-03-20 LAB — VALPROIC ACID LEVEL: Valproic Acid Lvl: 60 ug/mL (ref 50–100)

## 2024-03-20 LAB — TROPONIN I (HIGH SENSITIVITY)
Troponin I (High Sensitivity): 4 ng/L (ref <18)
Troponin I (High Sensitivity): 3 ng/L (ref <18)

## 2024-03-20 LAB — TOXICOLOGY SCREEN, URINE: Creatinine, POC: 178 mg/dL

## 2024-03-20 MED ORDER — ASPIRIN 81 MG PO TBEC
81.0000 mg | DELAYED_RELEASE_TABLET | Freq: Every day | ORAL | Status: DC
  Administered 2024-03-21 – 2024-03-23 (×3): 81 mg via ORAL
  Filled 2024-03-20 (×3): qty 1

## 2024-03-20 MED ORDER — DIVALPROEX SODIUM 250 MG PO DR TAB
250.0000 mg | DELAYED_RELEASE_TABLET | Freq: Two times a day (BID) | ORAL | Status: DC
  Filled 2024-03-20: qty 6

## 2024-03-20 MED ORDER — ACETAMINOPHEN 325 MG PO TABS
650.0000 mg | ORAL_TABLET | Freq: Four times a day (QID) | ORAL | Status: DC | PRN
  Administered 2024-03-20 – 2024-03-22 (×2): 650 mg via ORAL
  Filled 2024-03-20 (×2): qty 2

## 2024-03-20 MED ORDER — POLYETHYLENE GLYCOL 3350 17 G PO PACK: 17.0000 g | PACK | Freq: Every day | ORAL | Status: DC | PRN

## 2024-03-20 MED ORDER — QUETIAPINE FUMARATE 100 MG PO TABS
300.0000 mg | ORAL_TABLET | Freq: Every day | ORAL | Status: DC
  Administered 2024-03-20 – 2024-03-22 (×3): 300 mg via ORAL
  Filled 2024-03-20 (×3): qty 3

## 2024-03-20 MED ORDER — ALBUTEROL SULFATE (2.5 MG/3ML) 0.083% IN NEBU
5.0000 mg | INHALATION_SOLUTION | Freq: Once | RESPIRATORY_TRACT | Status: AC
  Administered 2024-03-20: 5 mg via RESPIRATORY_TRACT
  Filled 2024-03-20: qty 6

## 2024-03-20 MED ORDER — DULOXETINE HCL 60 MG PO CPEP
90.0000 mg | ORAL_CAPSULE | Freq: Every day | ORAL | Status: DC
  Administered 2024-03-21 – 2024-03-23 (×3): 90 mg via ORAL
  Filled 2024-03-20 (×3): qty 1

## 2024-03-20 MED ORDER — SODIUM CHLORIDE 0.9% FLUSH
3.0000 mL | Freq: Two times a day (BID) | INTRAVENOUS | Status: DC
  Administered 2024-03-20 – 2024-03-22 (×5): 3 mL via INTRAVENOUS

## 2024-03-20 MED ORDER — TRAZODONE HCL 100 MG PO TABS
100.0000 mg | ORAL_TABLET | Freq: Every day | ORAL | Status: DC
  Administered 2024-03-20 – 2024-03-22 (×3): 100 mg via ORAL
  Filled 2024-03-20 (×3): qty 1

## 2024-03-20 MED ORDER — HYDROXYZINE HCL 25 MG PO TABS
25.0000 mg | ORAL_TABLET | Freq: Two times a day (BID) | ORAL | Status: DC | PRN
  Administered 2024-03-20 – 2024-03-21 (×2): 25 mg via ORAL
  Filled 2024-03-20 (×2): qty 1

## 2024-03-20 MED ORDER — ACETAMINOPHEN 650 MG RE SUPP: 650.0000 mg | Freq: Four times a day (QID) | RECTAL | Status: DC | PRN

## 2024-03-20 MED ORDER — ENOXAPARIN SODIUM 40 MG/0.4ML IJ SOSY
40.0000 mg | PREFILLED_SYRINGE | INTRAMUSCULAR | Status: DC
  Administered 2024-03-20 – 2024-03-21 (×2): 40 mg via SUBCUTANEOUS
  Filled 2024-03-20 (×2): qty 0.4

## 2024-03-20 MED ORDER — ISOSORBIDE MONONITRATE ER 30 MG PO TB24
30.0000 mg | ORAL_TABLET | Freq: Every day | ORAL | Status: DC
  Administered 2024-03-20 – 2024-03-23 (×4): 30 mg via ORAL
  Filled 2024-03-20 (×4): qty 1

## 2024-03-20 MED ORDER — ATORVASTATIN CALCIUM 80 MG PO TABS
80.0000 mg | ORAL_TABLET | Freq: Every day | ORAL | Status: DC
  Administered 2024-03-21 – 2024-03-23 (×3): 80 mg via ORAL
  Filled 2024-03-20 (×3): qty 1

## 2024-03-20 MED ORDER — DIVALPROEX SODIUM 250 MG PO DR TAB
250.0000 mg | DELAYED_RELEASE_TABLET | Freq: Every day | ORAL | Status: DC
  Administered 2024-03-21 – 2024-03-23 (×3): 250 mg via ORAL
  Filled 2024-03-20 (×3): qty 1

## 2024-03-20 MED ORDER — DIVALPROEX SODIUM 500 MG PO DR TAB
1500.0000 mg | DELAYED_RELEASE_TABLET | Freq: Every day | ORAL | Status: DC
  Administered 2024-03-20 – 2024-03-22 (×3): 1500 mg via ORAL
  Filled 2024-03-20 (×3): qty 3

## 2024-03-20 MED ORDER — ALBUTEROL SULFATE (2.5 MG/3ML) 0.083% IN NEBU: 3.0000 mL | INHALATION_SOLUTION | Freq: Four times a day (QID) | RESPIRATORY_TRACT | Status: DC | PRN

## 2024-03-20 MED ORDER — GABAPENTIN 300 MG PO CAPS
600.0000 mg | ORAL_CAPSULE | Freq: Every day | ORAL | Status: DC
  Administered 2024-03-20 – 2024-03-23 (×4): 600 mg via ORAL
  Filled 2024-03-20 (×5): qty 2

## 2024-03-20 MED ORDER — BUDESON-GLYCOPYRROL-FORMOTEROL 160-9-4.8 MCG/ACT IN AERO
2.0000 | INHALATION_SPRAY | Freq: Two times a day (BID) | RESPIRATORY_TRACT | Status: DC
  Administered 2024-03-20 – 2024-03-23 (×5): 2 via RESPIRATORY_TRACT
  Filled 2024-03-20: qty 5.9

## 2024-03-20 MED ORDER — GABAPENTIN 300 MG PO CAPS
1200.0000 mg | ORAL_CAPSULE | Freq: Every day | ORAL | Status: DC
  Administered 2024-03-20 – 2024-03-22 (×3): 1200 mg via ORAL
  Filled 2024-03-20 (×4): qty 4

## 2024-03-20 MED ORDER — METHYLPREDNISOLONE SODIUM SUCC 125 MG IJ SOLR
125.0000 mg | Freq: Once | INTRAMUSCULAR | Status: AC
  Administered 2024-03-20: 125 mg via INTRAVENOUS
  Filled 2024-03-20: qty 2

## 2024-03-20 MED ORDER — PANTOPRAZOLE SODIUM 40 MG PO TBEC
40.0000 mg | DELAYED_RELEASE_TABLET | Freq: Every day | ORAL | Status: DC
  Administered 2024-03-21 – 2024-03-23 (×3): 40 mg via ORAL
  Filled 2024-03-20 (×3): qty 1

## 2024-03-20 NOTE — Hospital Course (Signed)
 Hx of susbtance abuse disorder, CAD, COPD. Going for his usual therapy today, generally weaker and more lethargic than usual. Here has a HR in 40, blood pressure is ok on low. Neb and solumedrol, with some improvement. O2 level has been ok. HFpEF, hx of liver failure.

## 2024-03-20 NOTE — Consult Note (Addendum)
 Cardiology Consultation   Patient ID: XZANDER GILHAM MRN: [id number]; DOB: October 09, 1960  Admit date: 03/20/2024 Date of Consult: 03/20/2024  PCP:  Keith Greig PARAS, NP   [facility] Providers Cardiologist:  None   - Followed by Keith Gibbs in [place]    Patient Profile: Keith Gibbs is a 63 y.o. male with a hx of pulmonology nodule, HTN, HLD, COPD, GERD, CAD, cirrhosis, schizoaffective disorder, depression, anxiety, bipolar, alcohol  use, marijuana and cocaine use, chronic hepatitis C, chronic pain who is being seen 03/20/2024 for the evaluation of weakness and bradycardia at the request of Keith Gibbs.  History of Present Illness: Keith Gibbs is a 63 year old male with above medical history. Patient is followed by Keith Gibbs with [facility]. Previously had a coronary CTA in 2017 that showed a 90% lesion in the mid LAD. He underwent cardiac catheterization and had a DES to the LAD in 05/2016. Nuclear stress test in 02/2018 showed a moderate, inferior reversible defect. He was taken back to the cath lab in 10/2019 that showed the previously placed mid LAD stent was patent with 60% in-stent restenosis treated with balloon angioplasty. He had a patent left main, Lcx, and RCA. LVEF 65%.   Patient underwent echocardiogram 10/2022 that showed normal LV systolic function, normal RV systolic function, mild TR, mild AI. Underwent cardiac catheterization 11/25/22 that showed a 25% prox-mid LAD stenosis, normal LV systolic function, EF >65% by visual estimate, no in-stent restenosis in the LAD. Stress test in 02/2023 showed ischemia in the RCA territory with borderline LV dysfunction. Echocardiogram showed normal LV systolic function, grade I DD, no wall motion abnormalities, mild AI.   Patient most recently underwent stress test on 01/29/24 that showed a mild, small reversible defect in the anteroapical wall with mild LV dysfunction, mild ischemia in LAD territory with EF 47%.  Echocardiogram 01/2224 showed normal LV systolic function, grade I DD, normal wall motion, normal PA systolic pressure, moderate AI.   Patient presented to the ED on 8/27 complaining of lethargy and change in mood. He had been at a support meeting, when staff noticed this change and called EMS. With EMS, HR 46 BPM, BP 126/70, oxygen 100% on room air. EKG in the ED showed sinus bradycardia with HR 48 BPM. Labs significant for hsTn negative x2. BNP 20. Hemoglobin 13.6, WBC 6.6. K 4.4, creatinine 0.96.   On interview, patient reports that he came to the ED for evaluation of shortness of breath and dizziness. He had been at his group therapy when he started to feel short of breath and dizzy. He also reported that he has been having random episodes of chest pressure for the past few days. Pressure is located in the middle of his chest, occurs randomly and is not associated with exertion. Chest pain is not at all similar to what he felt prior to having his stent placed. He is in the process of being worked up for an enlarging lung nodule, and was scheduled to see pulmonology as an outpatient today.   Past Medical History:  Diagnosis Date   Anginal pain (HCC)    Anxiety    Arthritis    Bipolar 1 disorder (HCC)    Bursitis    CAD (coronary artery disease)    Chronic pain    COPD (chronic obstructive pulmonary disease) (HCC)    Current use of long term anticoagulation    DAPT (ASA + clopidogrel )   Depression    Diverticulitis  Dyspnea    GERD (gastroesophageal reflux disease)    Grade I diastolic dysfunction    Hepatitis C 2012   No longer has Hep C   HLD (hyperlipidemia)    Hypertension    MI (myocardial infarction) (HCC)    Polysubstance abuse (HCC)    cocaine, marijuana, ETOH   PUD (peptic ulcer disease)    S/P angioplasty with stent 06/10/2016   a.) 90% stenosis of pLAD to mLAD - 2.5 x 18 mm Xience Alpine (DES x 1) placed to pLAD   S/P PTCA (percutaneous transluminal coronary  angioplasty) 12/04/2019   a.) 60% in stent restenosis of DES to pLAD; LVEF 65%.   Schizophrenia (HCC)    Stroke [facility])    Valvular insufficiency    a.) Mild MR, TR, PR; mild to moderate AR on 03/05/2018 TTE    Past Surgical History:  Procedure Laterality Date   ABDOMINAL SURGERY     removed small piece of intestines due to [facility] Diverticulosis   ANTERIOR LAT LUMBAR FUSION N/A 08/23/2022   Procedure: DIRECT LATERAL INTERBODY FUSION  LUMBAR TWO- LUMBAR THREE, LUMBAR THREE-LUMBAR FOUR , EXPLORE AND EXTEND FUSION LUMBAR TWO-LUMBAR FIVE, POSTERIOR DECOMPRESSION LUMBAR THREE-LUMBAR FOUR, LEFT LUMBAR TWO-LUMBAR THREE;  Surgeon: Debby Dorn MATSU, MD;  Location: [facility] OR;  Service: Neurosurgery;  Laterality: N/A;   APPENDECTOMY     BACK SURGERY     CARDIAC CATHETERIZATION Left 06/10/2016   Procedure: Left Heart Cath and Coronary Angiography;  Surgeon: Denyse DELENA Bathe, MD;  Location: [facility] INVASIVE CV LAB;  Service: Cardiovascular;  Laterality: Left;   CARDIAC CATHETERIZATION N/A 06/10/2016   Procedure: Coronary Stent Intervention;  Surgeon: Cara JONETTA Lovelace, MD;  Location: [facility] INVASIVE CV LAB;  Service: Cardiovascular;  Laterality: N/A;   CHOLECYSTECTOMY N/A 02/17/2022   Procedure: LAPAROSCOPIC CHOLECYSTECTOMY;  Surgeon: Stevie Herlene Righter, MD;  Location: [facility] OR;  Service: General;  Laterality: N/A;   COLON SURGERY     COLONOSCOPY     COLONOSCOPY WITH PROPOFOL  N/A 01/05/2017   Procedure: COLONOSCOPY WITH PROPOFOL ;  Surgeon: Therisa Bi, MD;  Location: [facility] ENDOSCOPY;  Service: Endoscopy;  Laterality: N/A;   COLONOSCOPY WITH PROPOFOL  N/A 02/13/2020   Procedure: COLONOSCOPY WITH PROPOFOL ;  Surgeon: Janalyn Keene NOVAK, MD;  Location: [facility] ENDOSCOPY;  Service: Endoscopy;  Laterality: N/A;   CORONARY ANGIOPLASTY WITH STENT PLACEMENT     CORONARY PRESSURE/FFR STUDY N/A 12/04/2019   Procedure: INTRAVASCULAR PRESSURE WIRE/FFR STUDY;  Surgeon: Wonda Sharper, MD;  Location: [facility] INVASIVE CV LAB;   Service: Cardiovascular;  Laterality: N/A;   ESOPHAGOGASTRODUODENOSCOPY (EGD) WITH PROPOFOL  N/A 01/05/2017   Procedure: ESOPHAGOGASTRODUODENOSCOPY (EGD) WITH PROPOFOL ;  Surgeon: Therisa Bi, MD;  Location: [facility] ENDOSCOPY;  Service: Endoscopy;  Laterality: N/A;   ESOPHAGOGASTRODUODENOSCOPY (EGD) WITH PROPOFOL  N/A 02/13/2020   Procedure: ESOPHAGOGASTRODUODENOSCOPY (EGD) WITH PROPOFOL ;  Surgeon: Janalyn Keene NOVAK, MD;  Location: [facility] ENDOSCOPY;  Service: Endoscopy;  Laterality: N/A;   KNEE ARTHROSCOPY WITH MEDIAL MENISECTOMY Right 09/05/2017   Procedure: KNEE ARTHROSCOPY WITH MEDIAL AND LATERAL  MENISECTOMY PARTIAL SYNOVECTOMY;  Surgeon: Kathlynn Sharper, MD;  Location: [facility] ORS;  Service: Orthopedics;  Laterality: Right;   LEFT HEART CATH AND CORONARY ANGIOGRAPHY N/A 12/04/2019   Procedure: LEFT HEART CATH AND CORONARY ANGIOGRAPHY;  Surgeon: Wonda Sharper, MD;  Location: [facility] INVASIVE CV LAB;  Service: Cardiovascular;  Laterality: N/A;   LEFT HEART CATH AND CORONARY ANGIOGRAPHY Left 11/25/2022   Procedure: LEFT HEART CATH AND CORONARY ANGIOGRAPHY;  Surgeon: Bathe Denyse DELENA, MD;  Location: [facility] INVASIVE CV LAB;  Service: Cardiovascular;  Laterality: Left;   REVERSE SHOULDER ARTHROPLASTY Right 08/31/2023   Procedure: RIGHT REVERSE SHOULDER ARTHROPLASTY;  Surgeon: [name], MD;  Location: [facility] OR;  Service: Orthopedics;  Laterality: Right;   SHOULDER SURGERY Right 04/09/2012   SPINE SURGERY     TOTAL KNEE ARTHROPLASTY Right 01/19/2021   Procedure: TOTAL KNEE ARTHROPLASTY - Medford Amber to Assist;  Surgeon: Kathlynn Sharper, MD;  Location: [facility] ORS;  Service: Orthopedics;  Laterality: Right;     Scheduled Meds:  [START ON 03/21/2024] aspirin  EC  81 mg Oral Daily   [START ON 03/21/2024] atorvastatin   80 mg Oral Daily   budesonide -glycopyrrolate -formoterol   2 puff Inhalation BID   divalproex   250-1,500 mg Oral Q12H   [START ON 03/21/2024] DULoxetine   90 mg Oral Daily   enoxaparin  (LOVENOX ) injection   40 mg Subcutaneous Q24H   gabapentin   600 mg Oral Daily   And   gabapentin   1,200 mg Oral QHS   [START ON 03/21/2024] pantoprazole   40 mg Oral Daily   QUEtiapine   300 mg Oral QHS   sodium chloride  flush  3 mL Intravenous Q12H   traZODone   100 mg Oral QHS   Continuous Infusions:  PRN Meds: acetaminophen  **OR** acetaminophen , albuterol , hydrOXYzine , polyethylene glycol  Allergies:    Allergies  Allergen Reactions   Asenapine Other (See Comments) and Nausea And Vomiting    Increased tremors   Dextromethorphan Hbr    Guaifenesin     Latuda [Lurasidone Hcl] Other (See Comments)    Tremors     Lurasidone Other (See Comments) and Hives    Other Reaction(s): Angioedema   Phenylephrine      Social History:   Social History   Socioeconomic History   Marital status: Widowed    Spouse name: Not on file   Number of children: Not on file   Years of education: Not on file   Highest education level: GED or equivalent  Occupational History   Occupation: disability   Occupation: stocking at gas station    Comment: 2 days per week  Tobacco Use   Smoking status: Former    Current packs/day: 1.50    Average packs/day: 1.5 packs/day for 42.4 years (63.6 ttl pk-yrs)    Types: Cigarettes    Start date: 10/26/1978    Quit date: 2000    Passive exposure: Current   Smokeless tobacco: Never  Vaping Use   Vaping status: Never Used  Substance and Sexual Activity   Alcohol  use: Not Currently    Alcohol /week: 3.0 standard drinks of alcohol     Types: 3 Cans of beer per week    Comment: occassionally    Drug use: Not Currently    Types: Cocaine, Marijuana    Comment: last on saturday   Sexual activity: Not Currently    Partners: Female    Birth control/protection: None  Other Topics Concern   Not on file  Social History Narrative   Not on file   Social Drivers of Health   Financial Resource Strain: High Risk (09/19/2023)   Overall Financial Resource Strain (CARDIA)    Difficulty of  Paying Living Expenses: Hard  Food Insecurity: Food Insecurity Present (09/19/2023)   Hunger Vital Sign    Worried About Running Out of Food in the Last Year: Often true    Ran Out of Food in the Last Year: Sometimes true  Transportation Needs: No Transportation Needs (09/19/2023)   PRAPARE - Administrator, Civil Service (Medical): No  Lack of Transportation (Non-Medical): No  Physical Activity: Unknown (09/19/2023)   Exercise Vital Sign    Days of Exercise per Week: 0 days    Minutes of Exercise per Session: Not on file  Stress: Stress Concern Present (09/19/2023)   [employer] of Occupational Health - Occupational Stress Questionnaire    Feeling of Stress : Rather much  Social Connections: Moderately Isolated (09/19/2023)   Social Connection and Isolation Panel    Frequency of Communication with Friends and Family: More than three times a week    Frequency of Social Gatherings with Friends and Family: More than three times a week    Attends Religious Services: More than 4 times per year    Active Member of [employer] or Organizations: No    Attends Banker Meetings: Not on file    Marital Status: Widowed  Intimate Partner Violence: Not At Risk (09/01/2023)   Humiliation, Afraid, Rape, and Kick questionnaire    Fear of Current or Ex-Partner: No    Emotionally Abused: No    Physically Abused: No    Sexually Abused: No    Family History:   Family History  Problem Relation Age of Onset   Osteoarthritis Mother    Heart disease Mother    Hypertension Mother    Depression Mother    Heart disease Father    Early death Father    Hypertension Father    Heart attack Father    Hypertension Sister    Parkinson's disease Maternal Grandfather    Prostate cancer Neg Hx    Bladder Cancer Neg Hx    Kidney cancer Neg Hx    Tremor Neg Hx      ROS:  Please see the history of present illness.  All other ROS reviewed and negative.     Physical Exam/Data: Vitals:    03/20/24 1230 03/20/24 1330 03/20/24 1430 03/20/24 1521  BP: 100/60 107/64 115/72   Pulse: (!) 49 (!) 49 (!) 50   Resp: 11 14 14    Temp:    97.6 F (36.4 C)  TempSrc:    Oral  SpO2: 94% 97% 100%   Weight:      Height:       No intake or output data in the 24 hours ending 03/20/24 1542    03/20/2024   11:25 AM 03/18/2024    2:58 PM 02/16/2024   11:41 AM  Last 3 Weights  Weight (lbs) 231 lb 7.7 oz 233 lb 230 lb  Weight (kg) 105 kg 105.688 kg 104.327 kg     Body mass index is 28.93 kg/m.  General:  Well nourished, well developed, in no acute distress. Laying flat in the bed  HEENT: normal Neck: no JVD Vascular: radial pulses 2+ bilaterally Cardiac:  normal S1, S2; regular rhythm, bradycardic. No murmurs  Lungs:  clear to auscultation bilaterally, no wheezing, rhonchi or rales. Normal WOB on room air  Abd: soft, tender to palpation in the right and left upper quadrants  Ext: no edema in BLE  Musculoskeletal:  No deformities  Skin: warm and dry  Neuro:   no focal abnormalities noted Psych:  Normal affect   EKG:  The EKG was personally reviewed and demonstrates:  sinus bradycardia with HR 48 BPM. No ischemic changes  Telemetry:  Telemetry was personally reviewed and demonstrates:  Sinus bradycardia with HR in the 40s-50s  Relevant CV Studies: Cardiac Studies & Procedures   ______________________________________________________________________________________________ CARDIAC CATHETERIZATION  CARDIAC CATHETERIZATION 11/25/2022  Conclusion  Prox LAD to Mid LAD lesion is 25% stenosed.   The left ventricular systolic function is normal.   The left ventricular ejection fraction is greater than 65% by visual estimate.   There is no mitral valve regurgitation.  Stent LAD no restenosis, LCX/RCA, and LVEF normal, medical treatment.  Findings Coronary Findings Diagnostic  Dominance: Right  Left Anterior Descending Prox LAD to Mid LAD lesion is 25% stenosed. The lesion was  previously treated .  Intervention  No interventions have been documented.   CARDIAC CATHETERIZATION  CARDIAC CATHETERIZATION 12/04/2019  Conclusion  Previously placed Mid LAD drug eluting stent is widely patent.  Balloon angioplasty was performed.  Prox LAD to Mid LAD lesion is 60% stenosed.  The left ventricular systolic function is normal.  LV end diastolic pressure is normal.  The left ventricular ejection fraction is greater than 65% by visual estimate.  1. Single vessel CAD with moderate in-stent restenosis in the LAD, negative pressure wire evaluation 2. Patent left main, LCx, and RCA without significant stenosis 3. Normal LV function, LVEF 65%  Recommend: medical therapy, tobacco cessation  Findings Coronary Findings Diagnostic  Dominance: Right  Left Anterior Descending Prox LAD to Mid LAD lesion is 60% stenosed. moderate in-stent restenosis - interrogated with RFR analysis - nonischemic with RVR 0.92 Previously placed Mid LAD drug eluting stent is widely patent. Vessel is the culprit lesion. The lesion is type B1 and located at the major branch. The lesion is mildly calcified.  First Diagonal Branch Vessel is large in size. Large branch without significant stenosis  Left Circumflex There is a large first OM with no significant stenosis  First Obtuse Marginal Branch Vessel is large in size. The vessel exhibits minimal luminal irregularities.  Right Coronary Artery Vessel is large. The vessel exhibits minimal luminal irregularities. Large, dominant vessel. Slow flow noted. There are no significant stenoses, only mild irregularities noted.  Intervention  No interventions have been documented.   STRESS TESTS  MYOCARDIAL PERFUSION IMAGING 01/29/2024   ECHOCARDIOGRAM  PCV ECHOCARDIOGRAM COMPLETE 02/13/2024  Narrative Images from the original result were not included. Reason for Visit  Echocardiogram   INDICATIONS:   Chest pain, shortness of  breath  Echocardiogram: An echocardiogram in (2-d) mode was performed and in Doppler mode with color flow velocity mapping was performed.   ventricular septum thickness 1.12 cm, L ventricular posterior wall thickness (diastole) 1.77 cm, left atrium size 3.7 cm, aortic root diameter 4.5 cm, L ventricle diastolic dimension 4.07 cm, L ventricle systolic dimension 2.69, L ventricle ejection fraction 63.2 %, and LV fractional shortening 33.9 % L ventricular outflow tract internal diameter 3.0 cm, L ventricular outflow tract flow velocity 1.63 m/s, aortic valve cusps 2.5 cm , aortic valve flow velocity 2.11 (m/sec), aortic valve systolic calculated mean flow gradient 10 mmHg, mitral valve diastolic peak flow velocity E 9.545 m/sec, and mitral valve diastolic peak flow E/A ratio 0.7 Mitral valve has trace regurgitation Aortic valve has  moderate regurgitation Tricuspid valve has trace regurgitation   ASSESSMENT Technically adequate study. Left atrium mildly dilated. Normal chamber sizes. Normal left ventricular systolic function. Mild left ventricular hypertrophy with GRADE 1 (relaxation abnormality) diastolic dysfunction. Normal left ventricular wall motion. No pulmonary regurgitation. Trace tricuspid regurgitation. Normal pulmonary artery pressure. Trace mitral regurgitation. Moderate aortic regurgitation. No pericardial effusion.          ______________________________________________________________________________________________       Laboratory Data: High Sensitivity Troponin:   Recent Labs  Lab 03/20/24 1201 03/20/24 1330  TROPONINIHS 4 3     Chemistry Recent Labs  Lab 03/20/24 1201  NA 132*  K 4.4  CL 102  CO2 24  GLUCOSE 95  BUN 13  CREATININE 0.96  CALCIUM  8.5*  GFRNONAA >60  ANIONGAP 6    Recent Labs  Lab 03/20/24 1201  PROT 5.7*  ALBUMIN  3.3*  AST 17  ALT 10  ALKPHOS 48  BILITOT 0.8   Lipids No results for input(s): CHOL, TRIG, HDL,  LABVLDL, LDLCALC, CHOLHDL in the last 168 hours.  Hematology Recent Labs  Lab 03/20/24 1201  WBC 6.6  RBC 4.11*  HGB 13.6  HCT 39.8  MCV 96.8  MCH 33.1  MCHC 34.2  RDW 13.5  PLT 141*   Thyroid  No results for input(s): TSH, FREET4 in the last 168 hours.  BNP Recent Labs  Lab 03/20/24 1419  BNP 20.6    DDimer No results for input(s): DDIMER in the last 168 hours.  Radiology/Studies:  DG Chest Port 1 View Result Date: 03/20/2024 CLINICAL DATA:  Cough and wheezing. EXAM: PORTABLE CHEST 1 VIEW COMPARISON:  05/30/2023 and CT chest 02/29/2024. FINDINGS: Trachea is midline. Heart size stable. Lungs are emphysematous. Mild interstitial prominence and indistinctness diffusely. No pleural fluid. Right shoulder arthroplasty. IMPRESSION: Probable mild pulmonary edema. Electronically Signed   By: Newell Eke M.D.   On: 03/20/2024 12:20     Assessment and Plan:  Chest pain  Shortness of breath  CAD  - Patient presented complaining of shortness of breath and intermittent chest pressure. Chest pressure is mild and has been occurring randomly for the past 3 days. Not associated with exertion - Previously had stent to his Mid LAD in 2017. Later had in stent restenosis that was treated with balloon angioplasty in 2021 - Most recent cath in 11/2022 showed 25% prox-mid LAD stenosis, no significant CAD otherwise. No in-stent restenosis in the mid LAD noted  - Most recent echocardiogram from 01/2024 showed normal LV systolic function, grade I DD, normal wall motion, normal PA systolic pressure, moderate AI  - Of note, patient is in the process of being evaluated for an enlarging pulmonary nodule. Underwent CT chest 02/29/24 that showed an enlarging spiculated right upper lobe nodule, highly worrisome for bronchogenic carcinoma. He was suppose to see pulmonary today but had to cancel due to ED visit  - In the ED today, CXR showed probable mild pulmonary edema. BNP 20. hsTn negative x2. EKG  nonischemic  - Suspect symptoms are related to pulmonary nodule and likely lung cancer. He also has COPD which is likely contributing  - Ordered echocardiogram  - No plans for ischemic evaluation at this time given negative troponin, atypical symptoms  - Recommend further pulm eval  - Continue ASA 81 mg daily, lipitor  80 mg daily, imdur  30 mg daily   Bradycardia  Dizziness  Lethargy  - Patient has been in sinus bradycardia with HR in the 40s-50s. Reports some lethargy and dizziness  - Patient not on AV nodal medications. Of note, some of his psych medications had been adjusted recently including depakote   - Continue to monitor on telemetry. Could consider outpatient monitor   Otherwise per primary  - Cirrhosis, history of hepatitis C  - Schizoaffective disorder, depression, anxiety, bipolar disorder, polysubstance use  - GERD  Risk Assessment/Risk Scores:   For questions or updates, please contact [facility] Please consult [email or web address] for contact info under    Signed, Rollo FABIENE Louder, PA-C  03/20/2024 3:42 PM  I  have personally seen and examined the patient.  My HPI, Exam, and assessment and plan are below, independent of the NPP above.  Keith Gibbs is a 63 year old with schizoaffective disorder and coronary artery disease who presents with dizziness and shortness of breath.  He experiences dizziness and disorientation, describing a sensation of 'squishy swash' in his head. He also has shortness of breath, which has improved slightly after a breathing treatment. He notes a 'coolness' in his chest with each breath, likening it to the sensation of a peppermint patty.  He has a history of coronary artery disease, with a stent placed in the mid left anterior descending artery in 2017. He recalls a similar 'coolness' in his chest when his blockage was previously addressed. A past heart catheterization in 2024 showed a small nonobstructive blockage, and a recent stress  test indicated a blockage and mitral regurgitation.  He is undergoing evaluation for a lung nodule, which has been enlarging. He was scheduled to see a pulmonologist for further assessment. He reports occasional pain in his chest and abdomen, and burning during urination.  His medication regimen includes nitroglycerin  as needed, aspirin , atorvastatin , and Imdur . He has a history of substance use, including alcohol , marijuana, and cocaine, all of which are resolved. He also has a history of smoking, which has resolved, and COPD.  He has a history of schizoaffective disorder and depression, with recent changes to his Depakote  and other medications. He reports feeling generalized fatigue and unwellness, which has affected his mood.  - Tobacco: Former smoker. History of smoking, now resolved - Alcohol : History of alcohol  use, now resolved - Illicit Substances: Marijuana (History of marijuana use, now resolved), Cocaine (History of cocaine use, now resolved) - Partner Status: Single - Living Situation: Lives alone  Exam notable for  Gen: no distress  Neck: No JVD Ears:  Dempsey Sign Cardiac: No Rubs or Gallops, no Murmur, RRR +2 radial pulses Respiratory: Clear to auscultation bilaterally, normal effort and respiratory rate GI: Soft, nontender, non-distended  MS: No  edema;  moves all extremities Integument: Skin feels warm Neuro:  At time of evaluation, alert and oriented to person/place/time/situation Psych: Flat affect, patient feels anxious   - BNP: Normal - Troponin: Normal  RADIOLOGY - Lung nodule evaluation: Enlarging speculated right upper lobe nodule concerning for primary bronchogenic carcinoma  DIAGNOSTIC - Stress: Normal function, small apical ischemia (01/2024) - EKG: Sinus bradycardia, no ST changes, T wave inversions, no PR prolongation - Left heart catheterization: Normal (11/25/2022) - Echo: Mitral regurgitation, small nonobstructive blockage - TELE: sinus bradycardia  50s  In assessment and plan:   Suspected right upper lobe lung cancer Enlarging speculated right upper lobe nodule concerning for primary bronchogenic carcinoma. Symptoms include generalized fatigue and shortness of breath. Awaiting pulmonology evaluation for further management. - Coordinate with internal medicine and pulmonology to determine plan for lung nodule - Consider PET scan if indicated by pulmonology - Await decision on biopsy or bronchoscopy based on pulmonology's assessment  Coronary artery disease with prior LAD stent and nonobstructive disease - Coronary artery disease with prior LAD stent. Current symptoms include chest discomfort similar to previous episodes. Recent stress test showed small nonobstructive blockage and mitral regurgitation. Echocardiogram in July showed normal function with small apical ischemia. Current presentation less likely related to coronary artery disease, but angina is possible. Medical decision making involves using medication to manage symptoms initially, with consideration of heart catheterization if symptoms persist and lung nodule does not require immediate intervention. Risks  of stenting include the need for dual antiplatelet therapy, which could complicate potential biopsy or bronchoscopy for the lung nodule. - he does not appear to be actually taking Imdur ; discussed SE risk and started thearpy - Repeat echocardiogram to assess cardiac function including strain - Keep on telemetry for monitoring - we need to understand the plan for his nodule prior to offering coronary procedures: he would need to have no plans for bronchoscopy or lung biopsy bewfore we would consider cath due to bleeding risk  Sinus bradycardia - Marked sinus bradycardia on EKG without ST changes or PR prolongation. Consistent with telemetry findings. No current indications for pacing.  This does not appears to be driving symptoms.  Chronic obstructive pulmonary disease -SOB  improved with breathing treatment- does not appear to have cardiac dyspnea  Hyponatremia Hyponatremia noted on laboratory workup.  Aortic atherosclerosis and mild aortic dilation Mild aortic dilation and aortic atherosclerosis noted. - echo as above - statin therapy recommended  Keith Leavens, MD FASE [facility] Cardiologist [facility]  [address] [place], [address] [place], [place] [address] [phone]  5:57 PM

## 2024-03-20 NOTE — Progress Notes (Signed)
 Patient presented to office today for Chemical Dependency group. Patient informed group moderator that he was feeling unwell. Patient was assessed by medical staff. Patient reported symptoms of Shortness of breath, lightheaded, dizziness, and slight disorientation.  Patient's vitals were obtained. Patient's Blood pressure, temp and O2 were within normal limits. Patient's resting heart rate ranged between 55-51 bpm. Patient was visibly shaking and diaphoretic. EMS was called due to patient's medical history. Patient's family was notified. Patient with EMS to hospital.

## 2024-03-20 NOTE — H&P (Signed)
 History and Physical   Keith Gibbs FMW:[id number] DOB: 1960/08/25 DOA: 03/20/2024  PCP: Lorren Greig PARAS, NP   Patient coming from: Home/behavioral health urgent care   Chief Complaint: Weakness, lethargy  HPI: Keith Gibbs is a 63 y.o. male with medical history significant of hypertension, hyperlipidemia, COPD, GERD, CAD status post stent, cirrhosis, RLS, diastolic CHF, schizoaffective disorder, depression, anxiety, bipolar, alcohol  use, marijuana use, cocaine use, opioid use, chronic hepatitis C, chronic pain, BPH, gout, anemia presenting with weakness and lethargy.  Patient has had some issues with cough and shortness of breath for the past 3 days.  Reports some yellow sputum.  Also reports some chest heaviness and dizziness today.  Was seen at behavioral health urgent care and noted to have bradycardia with the above symptoms and was sent to the ED for further evaluation.  Has had some medication adjustments recently including increase in his Depakote  dose.  Denies fever, chills, abdominal pain, constipation, diarrhea, nausea, vomiting.  ED Course: Vital signs in the ED notable for heart rate in the 40s, blood pressure in the 100s-110 systolic.  Lab workup included CMP with sodium 132, calcium  8.5, protein 5.9, albumin  3.3.  CBC with platelets 141.  Valproic acid  level 60.  Troponin negative with repeat pending.  BNP pending.  UDS pending.  Ethanol level negative.  Ammonia 54.  VBG with normal pH and normal pCO2.  Chest x-ray with probable mild pulmonary edema.  Patient noted to be wheezing so received Solu-Medrol  and albuterol  in the ED.  Cardiology consulted for presumed symptomatic bradycardia and will see the patient.  Review of Systems: As per HPI otherwise all other systems reviewed and are negative.  Past Medical History:  Diagnosis Date   Anginal pain (HCC)    Anxiety    Arthritis    Bipolar 1 disorder (HCC)    Bursitis    CAD (coronary artery disease)    Chronic  pain    COPD (chronic obstructive pulmonary disease) (HCC)    Current use of long term anticoagulation    DAPT (ASA + clopidogrel )   Depression    Diverticulitis    Dyspnea    GERD (gastroesophageal reflux disease)    Grade I diastolic dysfunction    Hepatitis C 2012   No longer has Hep C   HLD (hyperlipidemia)    Hypertension    MI (myocardial infarction) (HCC)    Polysubstance abuse (HCC)    cocaine, marijuana, ETOH   PUD (peptic ulcer disease)    S/P angioplasty with stent 06/10/2016   a.) 90% stenosis of pLAD to mLAD - 2.5 x 18 mm Xience Alpine (DES x 1) placed to pLAD   S/P PTCA (percutaneous transluminal coronary angioplasty) 12/04/2019   a.) 60% in stent restenosis of DES to pLAD; LVEF 65%.   Schizophrenia (HCC)    Stroke [facility])    Valvular insufficiency    a.) Mild MR, TR, PR; mild to moderate AR on 03/05/2018 TTE    Past Surgical History:  Procedure Laterality Date   ABDOMINAL SURGERY     removed small piece of intestines due to [facility] Diverticulosis   ANTERIOR LAT LUMBAR FUSION N/A 08/23/2022   Procedure: DIRECT LATERAL INTERBODY FUSION  LUMBAR TWO- LUMBAR THREE, LUMBAR THREE-LUMBAR FOUR , EXPLORE AND EXTEND FUSION LUMBAR TWO-LUMBAR FIVE, POSTERIOR DECOMPRESSION LUMBAR THREE-LUMBAR FOUR, LEFT LUMBAR TWO-LUMBAR THREE;  Surgeon: Debby Dorn MATSU, MD;  Location: [facility] OR;  Service: Neurosurgery;  Laterality: N/A;   APPENDECTOMY     BACK  SURGERY     CARDIAC CATHETERIZATION Left 06/10/2016   Procedure: Left Heart Cath and Coronary Angiography;  Surgeon: Denyse DELENA Bathe, MD;  Location: [facility] INVASIVE CV LAB;  Service: Cardiovascular;  Laterality: Left;   CARDIAC CATHETERIZATION N/A 06/10/2016   Procedure: Coronary Stent Intervention;  Surgeon: Cara JONETTA Lovelace, MD;  Location: [facility] INVASIVE CV LAB;  Service: Cardiovascular;  Laterality: N/A;   CHOLECYSTECTOMY N/A 02/17/2022   Procedure: LAPAROSCOPIC CHOLECYSTECTOMY;  Surgeon: Stevie Herlene Righter, MD;  Location: [facility] OR;   Service: General;  Laterality: N/A;   COLON SURGERY     COLONOSCOPY     COLONOSCOPY WITH PROPOFOL  N/A 01/05/2017   Procedure: COLONOSCOPY WITH PROPOFOL ;  Surgeon: Therisa Bi, MD;  Location: [facility] ENDOSCOPY;  Service: Endoscopy;  Laterality: N/A;   COLONOSCOPY WITH PROPOFOL  N/A 02/13/2020   Procedure: COLONOSCOPY WITH PROPOFOL ;  Surgeon: Janalyn Keene NOVAK, MD;  Location: [facility] ENDOSCOPY;  Service: Endoscopy;  Laterality: N/A;   CORONARY ANGIOPLASTY WITH STENT PLACEMENT     CORONARY PRESSURE/FFR STUDY N/A 12/04/2019   Procedure: INTRAVASCULAR PRESSURE WIRE/FFR STUDY;  Surgeon: Wonda Sharper, MD;  Location: [facility] INVASIVE CV LAB;  Service: Cardiovascular;  Laterality: N/A;   ESOPHAGOGASTRODUODENOSCOPY (EGD) WITH PROPOFOL  N/A 01/05/2017   Procedure: ESOPHAGOGASTRODUODENOSCOPY (EGD) WITH PROPOFOL ;  Surgeon: Therisa Bi, MD;  Location: [facility] ENDOSCOPY;  Service: Endoscopy;  Laterality: N/A;   ESOPHAGOGASTRODUODENOSCOPY (EGD) WITH PROPOFOL  N/A 02/13/2020   Procedure: ESOPHAGOGASTRODUODENOSCOPY (EGD) WITH PROPOFOL ;  Surgeon: Janalyn Keene NOVAK, MD;  Location: [facility] ENDOSCOPY;  Service: Endoscopy;  Laterality: N/A;   KNEE ARTHROSCOPY WITH MEDIAL MENISECTOMY Right 09/05/2017   Procedure: KNEE ARTHROSCOPY WITH MEDIAL AND LATERAL  MENISECTOMY PARTIAL SYNOVECTOMY;  Surgeon: Kathlynn Sharper, MD;  Location: [facility] ORS;  Service: Orthopedics;  Laterality: Right;   LEFT HEART CATH AND CORONARY ANGIOGRAPHY N/A 12/04/2019   Procedure: LEFT HEART CATH AND CORONARY ANGIOGRAPHY;  Surgeon: Wonda Sharper, MD;  Location: [facility] INVASIVE CV LAB;  Service: Cardiovascular;  Laterality: N/A;   LEFT HEART CATH AND CORONARY ANGIOGRAPHY Left 11/25/2022   Procedure: LEFT HEART CATH AND CORONARY ANGIOGRAPHY;  Surgeon: Bathe Denyse DELENA, MD;  Location: [facility] INVASIVE CV LAB;  Service: Cardiovascular;  Laterality: Left;   REVERSE SHOULDER ARTHROPLASTY Right 08/31/2023   Procedure: RIGHT REVERSE SHOULDER ARTHROPLASTY;  Surgeon: [name], MD;  Location: [facility] OR;  Service: Orthopedics;  Laterality: Right;   SHOULDER SURGERY Right 04/09/2012   SPINE SURGERY     TOTAL KNEE ARTHROPLASTY Right 01/19/2021   Procedure: TOTAL KNEE ARTHROPLASTY - [place]ford Amber to Assist;  Surgeon: Kathlynn Sharper, MD;  Location: [facility] ORS;  Service: Orthopedics;  Laterality: Right;    Social History  reports that he quit smoking about 25 years ago. His smoking use included cigarettes. He started smoking about 45 years ago. He has a 63.6 pack-year smoking history. He has been exposed to tobacco smoke. He has never used smokeless tobacco. He reports that he does not currently use alcohol  after a past usage of about 3.0 standard drinks of alcohol  per week. He reports that he does not currently use drugs after having used the following drugs: Cocaine and Marijuana.  Allergies  Allergen Reactions   Asenapine Other (See Comments) and Nausea And Vomiting    Increased tremors   Dextromethorphan Hbr    Guaifenesin     Latuda [Lurasidone Hcl] Other (See Comments)    Tremors     Lurasidone Other (See Comments) and Hives    Other Reaction(s): Angioedema   Phenylephrine      Family  History  Problem Relation Age of Onset   Osteoarthritis Mother    Heart disease Mother    Hypertension Mother    Depression Mother    Heart disease Father    Early death Father    Hypertension Father    Heart attack Father    Hypertension Sister    Parkinson's disease Maternal Grandfather    Prostate cancer Neg Hx    Bladder Cancer Neg Hx    Kidney cancer Neg Hx    Tremor Neg Hx    Reviewed on admission  Prior to Admission medications   Medication Sig Start Date End Date Taking? Authorizing Provider  acetaminophen  (TYLENOL ) 325 MG tablet Take 1 tablet (325 mg total) by mouth every 6 (six) hours as needed for mild pain (pain score 1-3) (or temp > 100.5). 09/01/23   [name], MD  albuterol  (VENTOLIN  HFA) 108 [phone] Base) MCG/ACT inhaler Inhale 2 puffs into the  lungs every 6 (six) hours as needed for wheezing or shortness of breath. 12/19/23   Lorren Greig PARAS, NP  aspirin  EC 81 MG tablet Take 1 tablet (81 mg total) by mouth daily. Swallow whole. 02/03/23   Evelena Figures, MD  atorvastatin  (LIPITOR ) 80 MG tablet TAKE 1 TABLET BY MOUTH EVERY DAY 01/15/24   [name], MD  budesonide -glycopyrrolate -formoterol  (BREZTRI  AEROSPHERE) 160-9-4.8 MCG/ACT AERO inhaler Inhale 2 puffs into the lungs in the morning and at bedtime. 12/19/23   Lorren Greig PARAS, NP  celecoxib  (CELEBREX ) 100 MG capsule Take 2 capsules (200 mg total) by mouth 2 (two) times daily. 01/22/24   Magnant, Carlin CROME, PA-C  divalproex  (DEPAKOTE ) 500 MG DR tablet Take half tablet in the morning and 3 tablets at bedtime 02/08/24   Hoang, Daniela B, MD  doxycycline  (VIBRA -TABS) 100 MG tablet Take 200 mg by mouth once. 12/19/23   [provider]  DULoxetine  (CYMBALTA ) 30 MG capsule Take 3 capsules (90 mg total) by mouth daily. 02/08/24   Izella Ismael NOVAK, MD  gabapentin  (NEURONTIN ) 600 MG tablet Take 1 tablet (600 mg total) by mouth daily AND 2 tablets (1,200 mg total) at bedtime. 02/08/24   Izella Ismael NOVAK, MD  hydrOXYzine  (ATARAX ) 25 MG tablet Take 1 tablet (25 mg total) by mouth 2 (two) times daily as needed. 02/08/24   Izella Ismael NOVAK, MD  isosorbide  mononitrate (IMDUR ) 30 MG 24 hr tablet Take 1 tablet (30 mg total) by mouth 2 (two) times daily. 02/16/24   Fernand Denyse LABOR, MD  nitroGLYCERIN  (NITROSTAT ) 0.4 MG SL tablet Place 1 tablet (0.4 mg total) under the tongue every 5 (five) minutes as needed for chest pain. 02/03/23 02/16/24  Evelena Figures, MD  pantoprazole  (PROTONIX ) 40 MG tablet Take 1 tablet (40 mg total) by mouth daily. 12/19/23   Lorren Greig PARAS, NP  prazosin  (MINIPRESS ) 2 MG capsule Take 1 capsule (2 mg total) by mouth at bedtime. 03/08/24 06/06/24  Kober, Charles E, PA-C  QUEtiapine  (SEROQUEL ) 200 MG tablet Take 1.5 tablets (300 mg total) by mouth at bedtime. 02/08/24   Izella Ismael NOVAK, MD   traZODone  (DESYREL ) 100 MG tablet Take 1 tablet (100 mg total) by mouth at bedtime. 02/08/24   Izella Ismael NOVAK, MD    Physical Exam: Vitals:   03/20/24 1200 03/20/24 1230 03/20/24 1330 03/20/24 1430  BP: 105/66 100/60 107/64 115/72  Pulse: (!) 48 (!) 49 (!) 49 (!) 50  Resp: 20 11 14 14   Temp:      TempSrc:  SpO2: 100% 94% 97% 100%  Weight:      Height:        Physical Exam Constitutional:      General: He is not in acute distress.    Appearance: Normal appearance.  HENT:     Head: Normocephalic and atraumatic.     Mouth/Throat:     Mouth: Mucous membranes are moist.     Pharynx: Oropharynx is clear.  Eyes:     Extraocular Movements: Extraocular movements intact.     Pupils: Pupils are equal, round, and reactive to light.  Cardiovascular:     Rate and Rhythm: Regular rhythm. Bradycardia present.     Pulses: Normal pulses.     Heart sounds: Normal heart sounds.  Pulmonary:     Effort: Pulmonary effort is normal. No respiratory distress.     Breath sounds: Wheezing present.  Abdominal:     General: Bowel sounds are normal. There is no distension.     Palpations: Abdomen is soft.     Tenderness: There is no abdominal tenderness.  Musculoskeletal:        General: No swelling or deformity.  Skin:    General: Skin is warm and dry.  Neurological:     General: No focal deficit present.     Mental Status: Mental status is at baseline.    Labs on Admission: I have personally reviewed following labs and imaging studies  CBC: Recent Labs  Lab 03/20/24 1201  WBC 6.6  HGB 13.6  HCT 39.8  MCV 96.8  PLT 141*    Basic Metabolic Panel: Recent Labs  Lab 03/20/24 1201  NA 132*  K 4.4  CL 102  CO2 24  GLUCOSE 95  BUN 13  CREATININE 0.96  CALCIUM  8.5*    GFR: Estimated Creatinine Clearance: 103.3 mL/min (by C-G formula based on SCr of 0.96 mg/dL).  Liver Function Tests: Recent Labs  Lab 03/20/24 1201  AST 17  ALT 10  ALKPHOS 48  BILITOT 0.8  PROT  5.7*  ALBUMIN  3.3*    Urine analysis:    Component Value Date/Time   COLORURINE YELLOW 08/24/2023 1327   APPEARANCEUR CLEAR 08/24/2023 1327   APPEARANCEUR Clear 05/18/2021 1048   LABSPEC 1.016 08/24/2023 1327   PHURINE 5.0 08/24/2023 1327   GLUCOSEU NEGATIVE 08/24/2023 1327   HGBUR SMALL (A) 08/24/2023 1327   BILIRUBINUR NEGATIVE 08/24/2023 1327   BILIRUBINUR Negative 05/18/2021 1048   KETONESUR NEGATIVE 08/24/2023 1327   PROTEINUR NEGATIVE 08/24/2023 1327   UROBILINOGEN 0.2 12/12/2019 0905   NITRITE NEGATIVE 08/24/2023 1327   LEUKOCYTESUR NEGATIVE 08/24/2023 1327    Radiological Exams on Admission: DG Chest Port 1 View Result Date: 03/20/2024 CLINICAL DATA:  Cough and wheezing. EXAM: PORTABLE CHEST 1 VIEW COMPARISON:  05/30/2023 and CT chest 02/29/2024. FINDINGS: Trachea is midline. Heart size stable. Lungs are emphysematous. Mild interstitial prominence and indistinctness diffusely. No pleural fluid. Right shoulder arthroplasty. IMPRESSION: Probable mild pulmonary edema. Electronically Signed   By: Newell Eke M.D.   On: 03/20/2024 12:20   EKG: Independently reviewed.  Sinus bradycardia 40 beats minute.  Nonspecific T wave changes.  Assessment/Plan Principal Problem:   Symptomatic bradycardia Active Problems:   CAD S/P percutaneous coronary angioplasty   Opiate use   Chronic hepatitis C (HCC)   GERD (gastroesophageal reflux disease)   Bipolar 1 disorder (HCC)   Alcohol  use disorder, moderate, dependence (HCC)   Chronic pain syndrome   Cirrhosis of liver (HCC)   BPH (benign prostatic hyperplasia)  Cocaine use   Anxiety   Essential hypertension   Hypercholesteremia   RLS (restless legs syndrome)   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HCC)   Iron deficiency anemia secondary to inadequate dietary iron intake   Major depressive disorder, recurrent severe without psychotic features (HCC)   Symptomatic bradycardia > Presenting with weakness, lethargy, chest pressure, dizziness.  Also  recent cough and shortness of breath. > Heart rate in the 40s-50s.  No other explanation for symptoms. > Does have some wheezing and received breathing treatments but possible volume overload on chest x-ray with additional labs pending. > Last echo was earlier this year and showed EF 63%, G1 DD, moderate aortic valve regurgitation. > Cardiology consulted in the ED and will see the patient. - Monitor on progressive unit overnight - Appreciate cardiology recommendations and assistance - Hold any nodal blocking agents, will hold antihypertensives as well given blood pressure is low normal.  COPD > Some cough and wheezing recently.  Continue to wheeze in the ED. > Received Solu-Medrol  and albuterol  in the ED.  If this is an exacerbation it is mild. - As needed DuoNebs - Continue with home Breztri    Hypertension - Holding antihypertensives as above  Hyperlipidemia - Continue atorvastatin   GERD - Continue PPI  CAD > Status post stent - Holding Imdur  as above - Continue home atorvastatin , ASA  Schizoaffective disorder Depression Anxiety Bipolar disorder Polysubstance use > Follows with behavioral health outpatient.  - Continue home duloxetine , hydroxyzine , Seroquel , Depakote , trazodone   Cirrhosis History of hepatitis C > Patient reports he has been treated in the past for hepatitis C. > LFTs currently stable.  Last EGD was reportedly in 2018 with no evidence of varices. - Noted - Trend LFTs  BPH Gout - Noted  DVT prophylaxis: Lovenox  Code Status:   Full Family Communication:  None on admission, not present at time of exam.   Disposition Plan:   Patient is from:  Home  Anticipated DC to:  Home  Anticipated DC date:  1 to 3 days  Anticipated DC barriers: None  Consults called:  Cardiology Admission status:  Observation, progressive  Severity of Illness: The appropriate patient status for this patient is OBSERVATION. Observation status is judged to be reasonable and  necessary in order to provide the required intensity of service to ensure the patient's safety. The patient's presenting symptoms, physical exam findings, and initial radiographic and laboratory data in the context of their medical condition is felt to place them at decreased risk for further clinical deterioration. Furthermore, it is anticipated that the patient will be medically stable for discharge from the hospital within 2 midnights of admission.    Marsa KATHEE Scurry MD [facility]  How to contact the [facility] Attending or Consulting provider 7A - 7P or covering provider during after hours 7P -7A, for this patient?   Check the care team in [facility] and look for a) attending/consulting [facility] provider listed and b) the [facility] team listed Log into [email or web address] and use [place]'s universal password to access. If you do not have the password, please contact the hospital operator. Locate the [facility] provider you are looking for under [facility] and page to a number that you can be directly reached. If you still have difficulty reaching the provider, please page the [facility] (Director on Call) for the Hospitalists listed on amion for assistance.  03/20/2024, 3:03 PM

## 2024-03-20 NOTE — Telephone Encounter (Signed)
 I tried to contact Keith Gibbs. When I called the line rang and then said my call could not be complete as dialed and to try again later. I tried x2. I will try again later.

## 2024-03-20 NOTE — Telephone Encounter (Signed)
 Copied from CRM #[id number]. Topic: Clinical - Medical Advice >> Mar 20, 2024 12:50 PM Russell PARAS wrote: Reason for CRM:   Pt's mother Slater contacted clinic, due to needing to reschedule appt today w/Dgayli at 2:00. He is currently in the ER due to brachycardia. Slater is concerned about his recent imaging and really wanted to come to visit today to review the results and speak about treatment. She is requesting if possible, if provider or nurse could either contact her or speak with her at the hospital due to patient's anxiety and concern with the results.   CB#  [phone]

## 2024-03-20 NOTE — Telephone Encounter (Signed)
 I spoke with Slater. She said they are admitting the patient to the hospital for Cardiac problems.  She will call us  back if he is unable to make the appt on 9/2.  Nothing further needed.

## 2024-03-20 NOTE — ED Triage Notes (Signed)
 PT BIB [facility] from [facility] today while PT was attending a support meeting. Staff noticed lethargy and change in mood. PT aox4. PT's HR in 40's on assessment, and referred to ED. Recent changes in medication in past weeks.  [facility] VS: HR 46, BP 128/70, CBG 106, RR 22, 100% Spo2 on RA.

## 2024-03-20 NOTE — Progress Notes (Signed)
 Daily Group Progress Note   Program: CD IOP     Group Time: 9:25 a.m. to 10:25 pm      Type of Therapy: Process and Psychoeducational    Topic: The therapists check in with group members, assess for SI/HI/psychosis and overall level of functioning. The therapists inquire about sobriety date and number of community support meetings attended since last session.   Therapists show the video of Elspeth Rocker, PhD discussing the three stages of relapse. Therapists prompt discussion on the video asking group member to provide the steps that led to each relapse stage and to provide examples of it.   Summary: Keith Gibbs presents today rating his depression as a 5 and his anxiety as a 5. He noted on the check in sheet, that he has the same sobriety date and is attending meetings. He does have a sponsor.  When it came time for Keith Gibbs to verbally check in, he reported that he did not feel well and was feeling dizzy and disoriented. Therapist escorted Keith Gibbs to the front desk who then contacted nursing who check his vitals. Because his vitals were of concern to nursing and the nursing supervisor, EMS was called and transported Keith Gibbs to the hospital.   Progress Towards Goals:  UDS collected: No Results: none   AA/NA attended?:  yes   Sponsor?:  Yes     Elsie Maier, MA, LCSW, [facility], LCAS Darice Simpler, LMFT, LCAS 03/20/2024

## 2024-03-20 NOTE — ED Provider Notes (Signed)
 [place] EMERGENCY DEPARTMENT AT [facility] Provider Note   CSN: [id number] Arrival date & time: 03/20/24  1111     Patient presents with: Bradycardia   Keith Gibbs is a 63 y.o. male.  {Add pertinent medical, surgical, social history, OB history to HPI:7528} HPI 63 year old male presents today from behavioral health urgent care with report that he has had increased weakness and lethargy.  He reports he has had some cough and dyspnea with cough productive of yellow to green sputum.  Patient has had some recent medication changes.  He has had increased Depakote   Patient with history of substance use disorder but denies any recent substance use and has been in recovery.  He has a history of COPD and reports some ongoing coughing and dyspnea.  Patient with history of chronic hepatitis.  History of chronic pain and denies any current narcotic pain medicines.  History of hypertension and is current cigarette smoker.      Prior to Admission medications   Medication Sig Start Date End Date Taking? Authorizing Provider  acetaminophen  (TYLENOL ) 325 MG tablet Take 1 tablet (325 mg total) by mouth every 6 (six) hours as needed for mild pain (pain score 1-3) (or temp > 100.5). 09/01/23   [name], MD  albuterol  (VENTOLIN  HFA) 108 [phone] Base) MCG/ACT inhaler Inhale 2 puffs into the lungs every 6 (six) hours as needed for wheezing or shortness of breath. 12/19/23   Lorren Greig PARAS, NP  aspirin  EC 81 MG tablet Take 1 tablet (81 mg total) by mouth daily. Swallow whole. 02/03/23   Evelena Figures, MD  atorvastatin  (LIPITOR ) 80 MG tablet TAKE 1 TABLET BY MOUTH EVERY DAY 01/15/24   [name], MD  budesonide -glycopyrrolate -formoterol  (BREZTRI  AEROSPHERE) 160-9-4.8 MCG/ACT AERO inhaler Inhale 2 puffs into the lungs in the morning and at bedtime. 12/19/23   Lorren Greig PARAS, NP  celecoxib  (CELEBREX ) 100 MG capsule Take 2 capsules (200 mg total) by mouth 2 (two) times daily. 01/22/24    Magnant, Carlin CROME, PA-C  divalproex  (DEPAKOTE ) 500 MG DR tablet Take half tablet in the morning and 3 tablets at bedtime 02/08/24   Hoang, Daniela B, MD  doxycycline  (VIBRA -TABS) 100 MG tablet Take 200 mg by mouth once. 12/19/23   [provider]  DULoxetine  (CYMBALTA ) 30 MG capsule Take 3 capsules (90 mg total) by mouth daily. 02/08/24   Izella Ismael NOVAK, MD  gabapentin  (NEURONTIN ) 600 MG tablet Take 1 tablet (600 mg total) by mouth daily AND 2 tablets (1,200 mg total) at bedtime. 02/08/24   Izella Ismael NOVAK, MD  hydrOXYzine  (ATARAX ) 25 MG tablet Take 1 tablet (25 mg total) by mouth 2 (two) times daily as needed. 02/08/24   Izella Ismael NOVAK, MD  isosorbide  mononitrate (IMDUR ) 30 MG 24 hr tablet Take 1 tablet (30 mg total) by mouth 2 (two) times daily. 02/16/24   Fernand Denyse LABOR, MD  nitroGLYCERIN  (NITROSTAT ) 0.4 MG SL tablet Place 1 tablet (0.4 mg total) under the tongue every 5 (five) minutes as needed for chest pain. 02/03/23 02/16/24  Evelena Figures, MD  pantoprazole  (PROTONIX ) 40 MG tablet Take 1 tablet (40 mg total) by mouth daily. 12/19/23   Lorren Greig PARAS, NP  prazosin  (MINIPRESS ) 2 MG capsule Take 1 capsule (2 mg total) by mouth at bedtime. 03/08/24 06/06/24  Kober, Charles E, PA-C  QUEtiapine  (SEROQUEL ) 200 MG tablet Take 1.5 tablets (300 mg total) by mouth at bedtime. 02/08/24   Izella Ismael NOVAK, MD  traZODone  (DESYREL ) 100 MG tablet Take 1 tablet (100 mg total) by mouth at bedtime. 02/08/24   Izella Ismael NOVAK, MD    Allergies: Asenapine, Dextromethorphan hbr, Guaifenesin , Latuda [lurasidone hcl], Lurasidone, and Phenylephrine     Review of Systems  Updated Vital Signs BP 107/64   Pulse (!) 49   Temp (!) 97.4 F (36.3 C) (Oral)   Resp 14   Ht 1.905 m (6' 3)   Wt 105 kg   SpO2 97%   BMI 28.93 kg/m   Physical Exam Vitals and nursing note reviewed.  Constitutional:      Appearance: Normal appearance.  HENT:     Head: Normocephalic and atraumatic.     Right Ear: External ear  normal.     Left Ear: External ear normal.     Nose: Nose normal.     Mouth/Throat:     Pharynx: Oropharynx is clear.  Eyes:     Pupils: Pupils are equal, round, and reactive to light.  Cardiovascular:     Rate and Rhythm: Normal rate and regular rhythm.     Pulses: Normal pulses.  Pulmonary:     Effort: No respiratory distress.     Breath sounds: Wheezing present.     Comments: Diffuse expiratory wheezes Abdominal:     General: Abdomen is flat.     Palpations: Abdomen is soft.  Musculoskeletal:        General: Normal range of motion.     Cervical back: Normal range of motion.  Skin:    General: Skin is warm and dry.     Capillary Refill: Capillary refill takes less than 2 seconds.  Neurological:     General: No focal deficit present.     Mental Status: He is alert.  Psychiatric:        Mood and Affect: Mood normal.     (all labs ordered are listed, but only abnormal results are displayed) Labs Reviewed  CBC - Abnormal; Notable for the following components:      Result Value   RBC 4.11 (*)    Platelets 141 (*)    All other components within normal limits  COMPREHENSIVE METABOLIC PANEL WITH GFR - Abnormal; Notable for the following components:   Sodium 132 (*)    Calcium  8.5 (*)    Total Protein 5.7 (*)    Albumin  3.3 (*)    All other components within normal limits  BLOOD GAS, VENOUS - Abnormal; Notable for the following components:   Bicarbonate 29.0 (*)    Acid-Base Excess 3.0 (*)    All other components within normal limits  AMMONIA - Abnormal; Notable for the following components:   Ammonia 54 (*)    All other components within normal limits  VALPROIC ACID  LEVEL  ETHANOL  RAPID URINE DRUG SCREEN, HOSP PERFORMED  BRAIN NATRIURETIC PEPTIDE  TROPONIN I (HIGH SENSITIVITY)  TROPONIN I (HIGH SENSITIVITY)    EKG: None  Radiology: [facility] Chest Port 1 View Result Date: 03/20/2024 CLINICAL DATA:  Cough and wheezing. EXAM: PORTABLE CHEST 1 VIEW COMPARISON:   05/30/2023 and CT chest 02/29/2024. FINDINGS: Trachea is midline. Heart size stable. Lungs are emphysematous. Mild interstitial prominence and indistinctness diffusely. No pleural fluid. Right shoulder arthroplasty. IMPRESSION: Probable mild pulmonary edema. Electronically Signed   By: Newell Eke M.D.   On: 03/20/2024 12:20    {Document cardiac monitor, telemetry assessment procedure when appropriate:32947} Procedures   Medications Ordered in the ED  albuterol  (PROVENTIL ) (2.5 MG/3ML) 0.083% nebulizer solution 5  mg (5 mg Nebulization Given 03/20/24 1151)  methylPREDNISolone  sodium succinate (SOLU-MEDROL ) 125 mg/2 mL injection 125 mg (125 mg Intravenous Given 03/20/24 1233)    Clinical Course as of 03/20/24 1357  Wed Mar 20, 2024  1352 Complete metabolic panel reviewed interpreted s significant for mild hyponatremia with sodium 132, otherwise within normal limits [DR]  1352 CBC reviewed interpreted and within normal limits Troponin is normal at 4 [DR]  1353 VBG venous pH 7.39PCO2 of 48P O2 of 45 Ammonia is slightly elevated at 54 [DR]  1353 Valproic acid  level is normal at 60 Alcohol  less than 15 [DR]  1353 Chest x-Zeina Akkerman reviewed with increased interstitial markings normal heart size, will add BNP [DR]    Clinical Course User Index [DR] Levander Houston, MD   {Click here for ABCD2, HEART and other calculators REFRESH Note before signing:1}                              Medical Decision Making Amount and/or Complexity of Data Reviewed Labs: ordered. Radiology: ordered.  Risk Prescription drug management.   63 year old male history of substance use disorder, COPD, presents today with increased weakness and some increased lethargy.  Patient awake and alert here.  He also endorses some coughing and dyspnea. On physical evaluation patient is awake and alert is noted to have diffuse expiratory wheezing and bradycardia 1 bradycardia appears to be new onset.  Patient is not on any  beta-blockers.  He is not having active chest pain at this time. Blood pressure is stable here at 107/64 Echo from 02/13/2024 reviewed with normal chamber sizes, normal left ventricular systolic function, mild left ventricular hypertrophy with grade 1 diastolic dysfunction Gated perfusion study done 01/29/2024 Known coronary artery disease with 90% lesion mid LAD, DES to LAD lesion in 2017, cath 12/12/2019 with patent LAD stent, proximal to mid LAD lesion 60% Cardiology consulted and will see in evaluation 2 wheezing-patient with known history of COPD.  Treated here with albuterol  and Solu-Medrol .  He had some decrease in wheezing after treatment.  Unclear if this may represent cardiac wheezing.  Increased markings noted on chest x-Iris Tatsch.  BNP being added to labs. 3 generalized weakness may be secondary to 1 and 2.  However patient with some known liver failure and ammonia added.  It is elevated at 54.  No prior ammonia level noted in chart. Consulted cardiology Plan consult to   {Document critical care time when appropriate  Document review of labs and clinical decision tools ie CHADS2VASC2, etc  Document your independent review of radiology images and any outside records  Document your discussion with family members, caretakers and with consultants  Document social determinants of health affecting pt's care  Document your decision making why or why not admission, treatments were needed:32947:::1}   Final diagnoses:  None    ED Discharge Orders     None

## 2024-03-20 NOTE — Plan of Care (Signed)

## 2024-03-21 ENCOUNTER — Encounter (HOSPITAL_COMMUNITY): Payer: MEDICAID | Admitting: Psychiatry

## 2024-03-21 ENCOUNTER — Observation Stay (HOSPITAL_BASED_OUTPATIENT_CLINIC_OR_DEPARTMENT_OTHER): Payer: MEDICAID

## 2024-03-21 DIAGNOSIS — K7469 Other cirrhosis of liver: Secondary | ICD-10-CM | POA: Diagnosis not present

## 2024-03-21 DIAGNOSIS — F1020 Alcohol dependence, uncomplicated: Secondary | ICD-10-CM | POA: Diagnosis not present

## 2024-03-21 DIAGNOSIS — J441 Chronic obstructive pulmonary disease with (acute) exacerbation: Secondary | ICD-10-CM | POA: Diagnosis not present

## 2024-03-21 DIAGNOSIS — R0609 Other forms of dyspnea: Secondary | ICD-10-CM

## 2024-03-21 LAB — ECHOCARDIOGRAM COMPLETE
AR max vel: 3.61 cm2
AV Area VTI: 3.91 cm2
AV Area mean vel: 2.94 cm2
AV Mean grad: 7.5 mmHg
AV Peak grad: 17.1 mmHg
Ao pk vel: 2.07 m/s
Area-P 1/2: 3.45 cm2
Calc EF: 70.2 %
Height: 75 in
MV VTI: 3.85 cm2
P 1/2 time: 692 ms
S' Lateral: 3.6 cm
Single Plane A2C EF: 72.7 %
Single Plane A4C EF: 65.6 %
Weight: 3703.73 [oz_av]

## 2024-03-21 LAB — COMPREHENSIVE METABOLIC PANEL WITH GFR
ALT: 10 U/L (ref 0–44)
AST: 18 U/L (ref 15–41)
Albumin: 3 g/dL — ABNORMAL LOW (ref 3.5–5.0)
Alkaline Phosphatase: 48 U/L (ref 38–126)
Anion gap: 8 (ref 5–15)
BUN: 13 mg/dL (ref 8–23)
CO2: 23 mmol/L (ref 22–32)
Calcium: 8.6 mg/dL — ABNORMAL LOW (ref 8.9–10.3)
Chloride: 102 mmol/L (ref 98–111)
Creatinine, Ser: 1.12 mg/dL (ref 0.61–1.24)
GFR, Estimated: >60 mL/min (ref >60)
Glucose, Bld: 178 mg/dL — ABNORMAL HIGH (ref 70–99)
Potassium: 4.1 mmol/L (ref 3.5–5.1)
Sodium: 133 mmol/L — ABNORMAL LOW (ref 135–145)
Total Bilirubin: 0.5 mg/dL (ref 0.0–1.2)
Total Protein: 5.9 g/dL — ABNORMAL LOW (ref 6.5–8.1)

## 2024-03-21 LAB — CBC
HCT: 40.6 % (ref 39.0–52.0)
Hemoglobin: 14 g/dL (ref 13.0–17.0)
MCH: 32.9 pg (ref 26.0–34.0)
MCHC: 34.5 g/dL (ref 30.0–36.0)
MCV: 95.3 fL (ref 80.0–100.0)
Platelets: 132 K/uL — ABNORMAL LOW (ref 150–400)
RBC: 4.26 MIL/uL (ref 4.22–5.81)
RDW: 13.3 % (ref 11.5–15.5)
WBC: 11.4 K/uL — ABNORMAL HIGH (ref 4.0–10.5)
nRBC: 0 % (ref 0.0–0.2)

## 2024-03-21 LAB — GLUCOSE, CAPILLARY
Glucose-Capillary: 146 mg/dL — ABNORMAL HIGH (ref 70–99)
Glucose-Capillary: 169 mg/dL — ABNORMAL HIGH (ref 70–99)

## 2024-03-21 MED ORDER — METHYLPREDNISOLONE SODIUM SUCC 125 MG IJ SOLR
80.0000 mg | INTRAMUSCULAR | Status: AC
  Administered 2024-03-21: 80 mg via INTRAVENOUS
  Filled 2024-03-21: qty 2

## 2024-03-21 MED ORDER — ALBUTEROL SULFATE (2.5 MG/3ML) 0.083% IN NEBU
5.0000 mg | INHALATION_SOLUTION | Freq: Four times a day (QID) | RESPIRATORY_TRACT | Status: DC
  Administered 2024-03-21 – 2024-03-23 (×5): 5 mg via RESPIRATORY_TRACT
  Filled 2024-03-21 (×7): qty 6

## 2024-03-21 MED ORDER — PREDNISONE 20 MG PO TABS: 50.0000 mg | ORAL_TABLET | Freq: Every day | ORAL | Status: DC

## 2024-03-21 MED ORDER — SODIUM CHLORIDE 0.9 % IV SOLN
100.0000 mg | Freq: Two times a day (BID) | INTRAVENOUS | Status: DC
  Administered 2024-03-21 – 2024-03-22 (×3): 100 mg via INTRAVENOUS
  Filled 2024-03-21 (×3): qty 100

## 2024-03-21 NOTE — Plan of Care (Signed)
  Problem: Education: Goal: Knowledge of General Education information will improve Description: Including pain rating scale, medication(s)/side effects and non-pharmacologic comfort measures Outcome: Progressing   Problem: Clinical Measurements: Goal: Ability to maintain clinical measurements within normal limits will improve Outcome: Progressing Goal: Diagnostic test results will improve Outcome: Progressing Goal: Respiratory complications will improve Outcome: Progressing Goal: Cardiovascular complication will be avoided Outcome: Progressing   Problem: Activity: Goal: Risk for activity intolerance will decrease Outcome: Progressing   Problem: Nutrition: Goal: Adequate nutrition will be maintained Outcome: Progressing   Problem: Elimination: Goal: Will not experience complications related to bowel motility Outcome: Progressing Goal: Will not experience complications related to urinary retention Outcome: Progressing   Problem: Pain Managment: Goal: General experience of comfort will improve and/or be controlled Outcome: Progressing   Problem: Safety: Goal: Ability to remain free from injury will improve Outcome: Progressing   Problem: Skin Integrity: Goal: Risk for impaired skin integrity will decrease Outcome: Progressing

## 2024-03-21 NOTE — Progress Notes (Addendum)
 Progress Note  Patient Name: Keith Gibbs Date of Encounter: 03/21/2024  Primary Cardiologist: Dr. CANDIE Bathe  Subjective   No CP, less SOB. Head feels a little sloshy but much better than yesterday. Echo in progress.  Inpatient Medications    Scheduled Meds:  albuterol   5 mg Nebulization Q6H   aspirin  EC  81 mg Oral Daily   atorvastatin   80 mg Oral Daily   budesonide -glycopyrrolate -formoterol   2 puff Inhalation BID   divalproex   1,500 mg Oral QHS   divalproex   250 mg Oral Daily   DULoxetine   90 mg Oral Daily   enoxaparin  (LOVENOX ) injection  40 mg Subcutaneous Q24H   gabapentin   600 mg Oral Daily   And   gabapentin   1,200 mg Oral QHS   isosorbide  mononitrate  30 mg Oral Daily   pantoprazole   40 mg Oral Daily   [START ON 03/22/2024] predniSONE   50 mg Oral Q breakfast   QUEtiapine   300 mg Oral QHS   sodium chloride  flush  3 mL Intravenous Q12H   traZODone   100 mg Oral QHS   Continuous Infusions:  doxycycline  (VIBRAMYCIN ) IV     PRN Meds: acetaminophen  **OR** acetaminophen , albuterol , hydrOXYzine , polyethylene glycol   Vital Signs    Vitals:   03/20/24 2305 03/21/24 0305 03/21/24 0808 03/21/24 0855  BP: 135/69 128/67  124/65  Pulse: 69 (!) 55 63 60  Resp: 15 16  17   Temp: 97.9 F (36.6 C) 98.3 F (36.8 C)  98.9 F (37.2 C)  TempSrc: Oral Oral  Oral  SpO2: 97% 96% 97% 96%  Weight:      Height:        Intake/Output Summary (Last 24 hours) at 03/21/2024 1046 Last data filed at 03/21/2024 0551 Gross per 24 hour  Intake 480 ml  Output 1650 ml  Net -1170 ml      03/20/2024   11:25 AM 03/18/2024    2:58 PM 02/16/2024   11:41 AM  Last 3 Weights  Weight (lbs) 231 lb 7.7 oz 233 lb 230 lb  Weight (kg) 105 kg 105.688 kg 104.327 kg     Telemetry    NSR 50s, nocturnal bradycardia (briefly upper 30s but generally 40s) - Personally Reviewed  Physical Exam   GEN: No acute distress.  HEENT: Normocephalic, atraumatic, sclera non-icteric. Neck: No JVD or  bruits. Cardiac: RRR no murmurs, rubs, or gallops.  Respiratory: Good air movement but diffuse wheezing bilaterally on auscultation. Breathing is unlabored. GI: Soft, nontender, non-distended, BS +x 4. MS: no deformity. Extremities: No clubbing or cyanosis. No edema. Distal pedal pulses are 2+ and equal bilaterally. Neuro:  AAOx3. Follows commands. Psych:  Responds to questions appropriately with a normal affect.  Labs    High Sensitivity Troponin:   Recent Labs  Lab 03/20/24 1201 03/20/24 1330  TROPONINIHS 4 3      Cardiac EnzymesNo results for input(s): TROPONINI in the last 168 hours. No results for input(s): TROPIPOC in the last 168 hours.   Chemistry Recent Labs  Lab 03/20/24 1201 03/21/24 0333  NA 132* 133*  K 4.4 4.1  CL 102 102  CO2 24 23  GLUCOSE 95 178*  BUN 13 13  CREATININE 0.96 1.12  CALCIUM  8.5* 8.6*  PROT 5.7* 5.9*  ALBUMIN  3.3* 3.0*  AST 17 18  ALT 10 10  ALKPHOS 48 48  BILITOT 0.8 0.5  GFRNONAA >60 >60  ANIONGAP 6 8     Hematology Recent Labs  Lab 03/20/24 1201 03/21/24  0333  WBC 6.6 11.4*  RBC 4.11* 4.26  HGB 13.6 14.0  HCT 39.8 40.6  MCV 96.8 95.3  MCH 33.1 32.9  MCHC 34.2 34.5  RDW 13.5 13.3  PLT 141* 132*    BNP Recent Labs  Lab 03/20/24 1419  BNP 20.6     DDimer No results for input(s): DDIMER in the last 168 hours.   Radiology    DG Chest Port 1 View Result Date: 03/20/2024 CLINICAL DATA:  Cough and wheezing. EXAM: PORTABLE CHEST 1 VIEW COMPARISON:  05/30/2023 and CT chest 02/29/2024. FINDINGS: Trachea is midline. Heart size stable. Lungs are emphysematous. Mild interstitial prominence and indistinctness diffusely. No pleural fluid. Right shoulder arthroplasty. IMPRESSION: Probable mild pulmonary edema. Electronically Signed   By: Newell Eke M.D.   On: 03/20/2024 12:20    Cardiac Studies   Echo in process  Patient Profile     63 y.o. male with pulmonary nodule, HTN, HLD, COPD, GERD, CAD s/p DES to LAD  2017, 60% ISR 2021 tx with PTCA, cirrhosis, schizoaffective disorder, depression, anxiety, bipolar, alcohol  use, marijuana and cocaine use, chronic hepatitis C, chronic pain. Last nuc 01/2024 anterior/apical perfusion defect in the stress images that is partially reversible in the rest images, EF 47%, evidence of anterior apical hypokinesia, global ventricular systolic function - ? Started on Imdur  at that time. Admitted with lethargy and change in mood, prompting EMS eval. With EMS was bradycardic in the mid 40s. He also had some SOB/dizziness and chest pressure, prompting cardiology evaluation. CXR raised question of new pulmonary edema but BNP wnl. New productive cough.  Assessment & Plan    1. Chest pain, SOB, cough - hsTroponins negative, CXR raised question of pulm edema but BNP is stone cold normal so do not suspect this process -> await echocardiogram - suspect noncardiac, query related to enlarging spiculated lung mass concerning for lung CA - being tx for AECOPD - primary team plans to involve pulmonology - see below re: CAD  2. CAD s/p PCI 2017, 2021 - recent stress testing in 2024, 2025 with varying territories of ischemia, treated medically - last cath 11/2022 after abnormal stress test did not reveal any new stenosis, LVEF reported as 47% by 01/2024 nuc but normal by 01/2024 echo - anticipate continued medical therapy as he works up lung nodule  - continue ASA 81mg  daily - continue atorvastatin  80mg  day - continue Imdur  30mg  daily - avoid BB given h/o sinus bradycardia  3. Chronic sinus bradycardia - HR in the 40s appears to go back many years - given stable blood pressures, no indication for PPM, suspect this can continue to be followed by his primary cardiologist - add TSH to his AM labs  Remainder per primary  For questions or updates, please contact [facility] Please consult [email or web address] for contact info under Cardiology/STEMI.  Signed, Raphael LOISE Bring,  PA-C 03/21/2024, 10:46 AM     I have personally seen and examined the patient.  My HPI, Exam, and assessment and plan are below, independent of the NPP above.  CP has resolved Anxious about his nodule. Has two spots on his back and is worried the cancer has spread.  Exam notable for  Gen: no distress  Neck: No JVD Ears:  Dempsey Sign Cardiac: No Rubs or Gallops, no Murmur, RRR ++2 radial pulses Respiratory: Expiratory wheezes bilaterally, normal effort, normal  respiratory rate GI: Soft, nontender, non-distended  MS: No  edema;  moves all extremities Integument: Two small macules on  left back and one of right buttocks- brown macules Neuro:  At time of evaluation, alert and oriented to person/place/time/situation  Psych: Appropriate affect, patient feels anxious  Tele: sinus bradycardia to SR  In assessment and plan:  CAD prior intervention with no angina and prior CP;  Suspect non-cardiac continue ASA, statin and Imdur ; no plans for cath at this time Bradycardia- no indication for PPM Lung nodule- high concern for malignancy, Pulm eval is pending; LVEF is normal with mild RV dilation; if needed chemo there are no cardiac contraindications if needed hold ASA for biopsy  Stanly Leavens, MD FASE [facility] Cardiologist [facility]  [address] [place], [address] [place], [place] [address] [phone]  12:09 PM

## 2024-03-21 NOTE — Progress Notes (Signed)
 [facility] PROGRESS NOTE    Progress Note  Keith Gibbs  FMW:[id number] DOB: 22-Sep-1960 DOA: 03/20/2024 PCP: Lorren Greig PARAS, NP     Brief Narrative:   Keith Gibbs is an 63 y.o. male past medical history significant for essential hypertension CAD status post stents mid LAD's 2017, cirrhosis RLS, HFpEF, schizoaffective disorder, bipolar disorder alcohol  abuse polysubstance abuse including marijuana cocaine and opioid, chronic hepatitis C BPH, and undergoing evaluation for pulmonary nodule which has been enlarging comes in with weakness and lethargy was seen at behavioral health was found to be significantly bradycardic in the 40s, chest x-ray showed pulmonary edema, cardiology was consulted for symptomatic bradycardia   Assessment/Plan:   Acute COPD exacerbation: With new shortness of breath especially with ambulation and new productive cough: Will have him start him on IV steroids, antibiotics and inhalers. He relates his breathing is not improved. Satting greater 90% on room air. Continues into spirometry and flutter valve.  Symptomatic bradycardia Patient is on no AV nodal blocking agents. Cardiology was consulted who relates this does not appear to be the driving force of his symptoms.  Enlarging spiculated right upper lobe nodule : Will need to consult pulmonary for bronchoscopy.  Essential hypertension: Initially antihypertensive medications were held blood pressure seems to be well-controlled continue to monitor.  Hyperlipidemia:  continue statins.  GERD: Continue PPI.  CAD S/P percutaneous coronary angioplasty Continue statin and aspirin . Holding Imdur .  Schizoaffective disorder/depression/bipolar: Continue duloxetine  hydroxyzine  Seroquel  Depakote  and trazodone .  Polysubstance abuse: UDS was negative.  Cirrhosis/ Chronic hepatitis C (HCC) ICG 2018 showed no varices.  BPH: Continue current meds.  DVT prophylaxis: lovenox  Family  Communication:none Status is: Observation The patient remains OBS appropriate and will d/c before 2 midnights.    Code Status:     Code Status Orders  (From admission, onward)           Start     Ordered   03/20/24 1501  Full code  Continuous       Question:  By:  Answer:  Consent: discussion documented in EHR   03/20/24 1501           Code Status History     Date Active Date Inactive Code Status Order ID Comments User Context   08/31/2023 1654 09/01/2023 1625 Full Code [id number]  [name], MD Inpatient   01/28/2023 2350 02/03/2023 1345 Full Code [id number]  Alan Gailen BROCKS, NP Inpatient   11/25/2022 0832 11/25/2022 1527 Full Code [id number]  Fernand Denyse LABOR, MD Inpatient   08/23/2022 2048 08/26/2022 1906 Full Code [id number]  Debby Dorn MATSU, MD Inpatient   08/10/2022 0929 08/10/2022 1733 Full Code [id number]  Izella Ismael NOVAK, MD Inpatient   08/05/2022 1820 08/10/2022 0929 Full Code [id number]  Rainelle Pfeiffer, MD Inpatient   08/03/2022 1457 08/05/2022 1812 Full Code [id number]  Laurita Cort DASEN, MD ED   04/28/2022 1257 05/02/2022 1340 Full Code [id number]  Marry Clamp, MD ED   04/26/2022 1541 04/28/2022 1257 Full Code [id number]  Leontine Clarity, DO ED   02/28/2022 1928 03/02/2022 1827 Full Code [id number]  Vernetta Berg, MD ED   02/17/2022 1553 02/18/2022 1610 Full Code [id number]  Doug Lyle LABOR, PA-C Inpatient   12/17/2021 0142 12/17/2021 2344 Full Code [id number]  Lonzell Emeline HERO, DO ED   01/19/2021 1354 01/21/2021 1832 Full Code [id number]  Kathlynn Sharper, MD Inpatient   12/04/2019 1427 12/04/2019 2301 Full Code [id number]  Wonda Sharper, MD Inpatient   08/02/2019 0746 08/03/2019 1553 Full  Code [id number]  Bernard Drivers, MD ED   09/05/2017 1630 09/05/2017 2011 Full Code [id number]  Kathlynn Sharper, MD Inpatient   12/27/2016 1742 12/30/2016 1800 Full Code [id number]  Kandyce Norleen DASEN, MD Inpatient   06/10/2016 1329 06/11/2016 1542 Full Code [id number]  Fernand Denyse LABOR, MD Inpatient   06/10/2016 1208  06/10/2016 1329 Full Code [id number]  Barbette Cea, MD Inpatient         IV Access:   Peripheral IV   Procedures and diagnostic studies:   DG Chest Port 1 View Result Date: 03/20/2024 CLINICAL DATA:  Cough and wheezing. EXAM: PORTABLE CHEST 1 VIEW COMPARISON:  05/30/2023 and CT chest 02/29/2024. FINDINGS: Trachea is midline. Heart size stable. Lungs are emphysematous. Mild interstitial prominence and indistinctness diffusely. No pleural fluid. Right shoulder arthroplasty. IMPRESSION: Probable mild pulmonary edema. Electronically Signed   By: Newell Eke M.D.   On: 03/20/2024 12:20     Medical Consultants:   None.   Subjective:    Keith Gibbs relates his shortness of breath not improved.  Objective:    Vitals:   03/20/24 1951 03/20/24 2038 03/20/24 2305 03/21/24 0305  BP: (!) 141/68  135/69 128/67  Pulse: 61  69 (!) 55  Resp: 18  15 16   Temp: 97.7 F (36.5 C)  97.9 F (36.6 C) 98.3 F (36.8 C)  TempSrc: Oral  Oral Oral  SpO2: 93% 92% 97% 96%  Weight:      Height:       SpO2: 96 %   Intake/Output Summary (Last 24 hours) at 03/21/2024 0627 Last data filed at 03/21/2024 0551 Gross per 24 hour  Intake 480 ml  Output 1650 ml  Net -1170 ml   Filed Weights   03/20/24 1125  Weight: 105 kg    Exam: General exam: In no acute distress. Respiratory system: Good air movement with wheezing bilaterally Cardiovascular system: S1 & S2 heard, RRR. No JVD. Gastrointestinal system: Abdomen is nondistended, soft and nontender.  Central nervous system: Alert and oriented. No focal neurological deficits. Extremities: No pedal edema. Skin: No rashes, lesions or ulcers Psychiatry: Judgement and insight appear normal. Mood & affect appropriate.    Data Reviewed:    Labs: Basic Metabolic Panel: Recent Labs  Lab 03/20/24 1201 03/21/24 0333  NA 132* 133*  K 4.4 4.1  CL 102 102  CO2 24 23  GLUCOSE 95 178*  BUN 13 13  CREATININE 0.96 1.12  CALCIUM  8.5* 8.6*    GFR Estimated Creatinine Clearance: 88.5 mL/min (by C-G formula based on SCr of 1.12 mg/dL). Liver Function Tests: Recent Labs  Lab 03/20/24 1201 03/21/24 0333  AST 17 18  ALT 10 10  ALKPHOS 48 48  BILITOT 0.8 0.5  PROT 5.7* 5.9*  ALBUMIN  3.3* 3.0*   No results for input(s): LIPASE, AMYLASE in the last 168 hours. Recent Labs  Lab 03/20/24 1201  AMMONIA 54*   Coagulation profile No results for input(s): INR, PROTIME in the last 168 hours. COVID-19 Labs  No results for input(s): DDIMER, FERRITIN, LDH, CRP in the last 72 hours.  Lab Results  Component Value Date   SARSCOV2NAA NEGATIVE 03/20/2024   SARSCOV2NAA NEGATIVE 05/30/2023   SARSCOV2NAA NEGATIVE 08/05/2022   SARSCOV2NAA NEGATIVE 04/26/2022    CBC: Recent Labs  Lab 03/20/24 1201 03/21/24 0333  WBC 6.6 11.4*  HGB 13.6 14.0  HCT 39.8 40.6  MCV 96.8 95.3  PLT 141* 132*   Cardiac Enzymes: No results for input(s): CKTOTAL, CKMB, CKMBINDEX, TROPONINI in  the last 168 hours. BNP (last 3 results) No results for input(s): PROBNP in the last 8760 hours. CBG: No results for input(s): GLUCAP in the last 168 hours. D-Dimer: No results for input(s): DDIMER in the last 72 hours. Hgb A1c: No results for input(s): HGBA1C in the last 72 hours. Lipid Profile: No results for input(s): CHOL, HDL, LDLCALC, TRIG, CHOLHDL, LDLDIRECT in the last 72 hours. Thyroid  function studies: No results for input(s): TSH, T4TOTAL, T3FREE, THYROIDAB in the last 72 hours.  Invalid input(s): FREET3 Anemia work up: No results for input(s): VITAMINB12, FOLATE, FERRITIN, TIBC, IRON, RETICCTPCT in the last 72 hours. Sepsis Labs: Recent Labs  Lab 03/20/24 1201 03/21/24 0333  WBC 6.6 11.4*   Microbiology Recent Results (from the past 240 hours)  Resp panel by RT-PCR (RSV, Flu A&B, Covid) Anterior Nasal Swab     Status: None   Collection Time: 03/20/24  2:53 PM    Specimen: Anterior Nasal Swab  Result Value Ref Range Status   SARS Coronavirus 2 by RT PCR NEGATIVE NEGATIVE Final   Influenza A by PCR NEGATIVE NEGATIVE Final   Influenza B by PCR NEGATIVE NEGATIVE Final    Comment: (NOTE) The Xpert Xpress SARS-CoV-2/FLU/RSV plus assay is intended as an aid in the diagnosis of influenza from Nasopharyngeal swab specimens and should not be used as a sole basis for treatment. Nasal washings and aspirates are unacceptable for Xpert Xpress SARS-CoV-2/FLU/RSV testing.  Fact Sheet for Patients: BloggerCourse.com  Fact Sheet for Healthcare Providers: SeriousBroker.it  This test is not yet approved or cleared by the [place]  FDA and has been authorized for detection and/or diagnosis of SARS-CoV-2 by FDA under an Emergency Use Authorization (EUA). This EUA will remain in effect (meaning this test can be used) for the duration of the COVID-19 declaration under Section 564(b)(1) of the Act, 21 U.S.C. section 360bbb-3(b)(1), unless the authorization is terminated or revoked.     Resp Syncytial Virus by PCR NEGATIVE NEGATIVE Final    Comment: (NOTE) Fact Sheet for Patients: BloggerCourse.com  Fact Sheet for Healthcare Providers: SeriousBroker.it  This test is not yet approved or cleared by the [place]  FDA and has been authorized for detection and/or diagnosis of SARS-CoV-2 by FDA under an Emergency Use Authorization (EUA). This EUA will remain in effect (meaning this test can be used) for the duration of the COVID-19 declaration under Section 564(b)(1) of the Act, 21 U.S.C. section 360bbb-3(b)(1), unless the authorization is terminated or revoked.  Performed at [facility], [address] [place], [place] [address]      Medications:    aspirin  EC  81 mg Oral Daily   atorvastatin   80 mg Oral Daily    budesonide -glycopyrrolate -formoterol   2 puff Inhalation BID   divalproex   1,500 mg Oral QHS   divalproex   250 mg Oral Daily   DULoxetine   90 mg Oral Daily   enoxaparin  (LOVENOX ) injection  40 mg Subcutaneous Q24H   gabapentin   600 mg Oral Daily   And   gabapentin   1,200 mg Oral QHS   isosorbide  mononitrate  30 mg Oral Daily   pantoprazole   40 mg Oral Daily   QUEtiapine   300 mg Oral QHS   sodium chloride  flush  3 mL Intravenous Q12H   traZODone   100 mg Oral QHS   Continuous Infusions:    LOS: 0 days   Erle Odell Castor  [facility]  03/21/2024, 6:27 AM

## 2024-03-22 ENCOUNTER — Ambulatory Visit (HOSPITAL_COMMUNITY): Payer: MEDICAID

## 2024-03-22 DIAGNOSIS — I77810 Thoracic aortic ectasia: Secondary | ICD-10-CM | POA: Diagnosis present

## 2024-03-22 DIAGNOSIS — N179 Acute kidney failure, unspecified: Secondary | ICD-10-CM | POA: Diagnosis not present

## 2024-03-22 DIAGNOSIS — N400 Enlarged prostate without lower urinary tract symptoms: Secondary | ICD-10-CM | POA: Diagnosis present

## 2024-03-22 DIAGNOSIS — R911 Solitary pulmonary nodule: Secondary | ICD-10-CM

## 2024-03-22 DIAGNOSIS — E876 Hypokalemia: Secondary | ICD-10-CM | POA: Diagnosis not present

## 2024-03-22 DIAGNOSIS — I5032 Chronic diastolic (congestive) heart failure: Secondary | ICD-10-CM | POA: Diagnosis present

## 2024-03-22 DIAGNOSIS — Z7901 Long term (current) use of anticoagulants: Secondary | ICD-10-CM | POA: Diagnosis not present

## 2024-03-22 DIAGNOSIS — G894 Chronic pain syndrome: Secondary | ICD-10-CM | POA: Diagnosis present

## 2024-03-22 DIAGNOSIS — J441 Chronic obstructive pulmonary disease with (acute) exacerbation: Secondary | ICD-10-CM | POA: Diagnosis not present

## 2024-03-22 DIAGNOSIS — E7800 Pure hypercholesterolemia, unspecified: Secondary | ICD-10-CM | POA: Diagnosis present

## 2024-03-22 DIAGNOSIS — F1020 Alcohol dependence, uncomplicated: Secondary | ICD-10-CM | POA: Diagnosis present

## 2024-03-22 DIAGNOSIS — F259 Schizoaffective disorder, unspecified: Secondary | ICD-10-CM | POA: Diagnosis present

## 2024-03-22 DIAGNOSIS — C3411 Malignant neoplasm of upper lobe, right bronchus or lung: Secondary | ICD-10-CM | POA: Diagnosis present

## 2024-03-22 DIAGNOSIS — I2510 Atherosclerotic heart disease of native coronary artery without angina pectoris: Secondary | ICD-10-CM | POA: Diagnosis present

## 2024-03-22 DIAGNOSIS — G2581 Restless legs syndrome: Secondary | ICD-10-CM | POA: Diagnosis present

## 2024-03-22 DIAGNOSIS — R001 Bradycardia, unspecified: Secondary | ICD-10-CM | POA: Diagnosis present

## 2024-03-22 DIAGNOSIS — I351 Nonrheumatic aortic (valve) insufficiency: Secondary | ICD-10-CM | POA: Diagnosis present

## 2024-03-22 DIAGNOSIS — Z1152 Encounter for screening for COVID-19: Secondary | ICD-10-CM | POA: Diagnosis not present

## 2024-03-22 DIAGNOSIS — K219 Gastro-esophageal reflux disease without esophagitis: Secondary | ICD-10-CM | POA: Diagnosis present

## 2024-03-22 DIAGNOSIS — F319 Bipolar disorder, unspecified: Secondary | ICD-10-CM | POA: Diagnosis present

## 2024-03-22 DIAGNOSIS — D508 Other iron deficiency anemias: Secondary | ICD-10-CM | POA: Diagnosis present

## 2024-03-22 DIAGNOSIS — E871 Hypo-osmolality and hyponatremia: Secondary | ICD-10-CM | POA: Diagnosis present

## 2024-03-22 DIAGNOSIS — K7460 Unspecified cirrhosis of liver: Secondary | ICD-10-CM | POA: Diagnosis present

## 2024-03-22 DIAGNOSIS — I110 Hypertensive heart disease with heart failure: Secondary | ICD-10-CM | POA: Diagnosis present

## 2024-03-22 DIAGNOSIS — F17210 Nicotine dependence, cigarettes, uncomplicated: Secondary | ICD-10-CM | POA: Diagnosis present

## 2024-03-22 DIAGNOSIS — M109 Gout, unspecified: Secondary | ICD-10-CM | POA: Diagnosis present

## 2024-03-22 LAB — BASIC METABOLIC PANEL WITH GFR
Anion gap: 10 (ref 5–15)
BUN: 14 mg/dL (ref 8–23)
CO2: 24 mmol/L (ref 22–32)
Calcium: 8.7 mg/dL — ABNORMAL LOW (ref 8.9–10.3)
Chloride: 103 mmol/L (ref 98–111)
Creatinine, Ser: 0.93 mg/dL (ref 0.61–1.24)
GFR, Estimated: >60 mL/min (ref >60)
Glucose, Bld: 128 mg/dL — ABNORMAL HIGH (ref 70–99)
Potassium: 3.4 mmol/L — ABNORMAL LOW (ref 3.5–5.1)
Sodium: 137 mmol/L (ref 135–145)

## 2024-03-22 LAB — CBC
HCT: 37.3 % — ABNORMAL LOW (ref 39.0–52.0)
Hemoglobin: 13 g/dL (ref 13.0–17.0)
MCH: 32.8 pg (ref 26.0–34.0)
MCHC: 34.9 g/dL (ref 30.0–36.0)
MCV: 94.2 fL (ref 80.0–100.0)
Platelets: 131 K/uL — ABNORMAL LOW (ref 150–400)
RBC: 3.96 MIL/uL — ABNORMAL LOW (ref 4.22–5.81)
RDW: 13.7 % (ref 11.5–15.5)
WBC: 11 K/uL — ABNORMAL HIGH (ref 4.0–10.5)
nRBC: 0 % (ref 0.0–0.2)

## 2024-03-22 LAB — TSH: TSH: 0.289 u[IU]/mL — ABNORMAL LOW (ref 0.350–4.500)

## 2024-03-22 MED ORDER — DOXYCYCLINE HYCLATE 100 MG PO TABS
100.0000 mg | ORAL_TABLET | Freq: Two times a day (BID) | ORAL | Status: DC
  Administered 2024-03-22 – 2024-03-23 (×2): 100 mg via ORAL
  Filled 2024-03-22 (×2): qty 1

## 2024-03-22 MED ORDER — PREDNISONE 20 MG PO TABS
50.0000 mg | ORAL_TABLET | Freq: Every day | ORAL | Status: DC
  Administered 2024-03-23: 50 mg via ORAL
  Filled 2024-03-22: qty 1

## 2024-03-22 MED ORDER — METHYLPREDNISOLONE SODIUM SUCC 125 MG IJ SOLR
80.0000 mg | INTRAMUSCULAR | Status: AC
  Administered 2024-03-22: 80 mg via INTRAVENOUS
  Filled 2024-03-22: qty 2

## 2024-03-22 MED ORDER — POTASSIUM CHLORIDE CRYS ER 20 MEQ PO TBCR
40.0000 meq | EXTENDED_RELEASE_TABLET | Freq: Once | ORAL | Status: AC
  Administered 2024-03-22: 40 meq via ORAL
  Filled 2024-03-22: qty 2

## 2024-03-22 NOTE — Progress Notes (Signed)
 [facility] PROGRESS NOTE    Progress Note  Keith Gibbs  FMW:[id number] DOB: Sep 03, 1960 DOA: 03/20/2024 PCP: Lorren Greig PARAS, NP     Brief Narrative:   Keith Gibbs is an 63 y.o. male past medical history significant for essential hypertension CAD status post stents mid LAD's 2017, cirrhosis RLS, HFpEF, schizoaffective disorder, bipolar disorder alcohol  abuse polysubstance abuse including marijuana cocaine and opioid, chronic hepatitis C BPH, and undergoing evaluation for pulmonary nodule which has been enlarging comes in with weakness and lethargy was seen at behavioral health was found to be significantly bradycardic in the 40s, chest x-ray showed pulmonary edema, cardiology was consulted for symptomatic bradycardia  Assessment/Plan:   Acute COPD exacerbation: On steroids, antibiotics and inhalers his breathing is improved.  Still has persistent wheezing. Has been weaned to room air satting greater 90% on room air Continues into spirometry and flutter valve. Continue steroid IV for 24 more hours transition to oral steroids tomorrow morning.  Symptomatic bradycardia Patient is on no AV nodal blocking agents. Cardiology was consulted who relates this does not appear to be the driving force of his symptoms.  Enlarging spiculated right upper lobe nodule : Will need to consult pulmonary for bronchoscopy.  Essential hypertension: Initially antihypertensive medications were held blood pressure seems to be well-controlled continue to monitor.  Hyperlipidemia: Continue statins.  GERD: Continue PPI.  CAD S/P percutaneous coronary angioplasty Continue statin and aspirin . Holding Imdur .  Schizoaffective disorder/depression/bipolar: Continue duloxetine  hydroxyzine  Seroquel  Depakote  and trazodone .  Polysubstance abuse: UDS was negative.  Cirrhosis/ Chronic hepatitis C (HCC) ICG 2018 showed no varices.  BPH: Continue current meds.  DVT prophylaxis: lovenox  Family  Communication:none Status is: Observation The patient remains OBS appropriate and will d/c before 2 midnights.    Code Status:     Code Status Orders  (From admission, onward)           Start     Ordered   03/20/24 1501  Full code  Continuous       Question:  By:  Answer:  Consent: discussion documented in EHR   03/20/24 1501           Code Status History     Date Active Date Inactive Code Status Order ID Comments User Context   08/31/2023 1654 09/01/2023 1625 Full Code [id number]  [name], MD Inpatient   01/28/2023 2350 02/03/2023 1345 Full Code [id number]  Alan Gailen BROCKS, NP Inpatient   11/25/2022 0832 11/25/2022 1527 Full Code [id number]  Fernand Denyse LABOR, MD Inpatient   08/23/2022 2048 08/26/2022 1906 Full Code [id number]  Debby Dorn MATSU, MD Inpatient   08/10/2022 0929 08/10/2022 1733 Full Code [id number]  Izella Ismael NOVAK, MD Inpatient   08/05/2022 1820 08/10/2022 0929 Full Code [id number]  Rainelle Pfeiffer, MD Inpatient   08/03/2022 1457 08/05/2022 1812 Full Code [id number]  Laurita Cort DASEN, MD ED   04/28/2022 1257 05/02/2022 1340 Full Code [id number]  Marry Clamp, MD ED   04/26/2022 1541 04/28/2022 1257 Full Code [id number]  Leontine Clarity, DO ED   02/28/2022 1928 03/02/2022 1827 Full Code [id number]  Vernetta Berg, MD ED   02/17/2022 1553 02/18/2022 1610 Full Code [id number]  Doug Lyle LABOR, PA-C Inpatient   12/17/2021 0142 12/17/2021 2344 Full Code [id number]  Lonzell Emeline HERO, DO ED   01/19/2021 1354 01/21/2021 1832 Full Code [id number]  Kathlynn Sharper, MD Inpatient   12/04/2019 1427 12/04/2019 2301 Full Code [id number]  Wonda Sharper, MD Inpatient   08/02/2019 [phone] 08/03/2019 1553  Full Code [id number]  Bernard Drivers, MD ED   09/05/2017 1630 09/05/2017 2011 Full Code [id number]  Kathlynn Sharper, MD Inpatient   12/27/2016 1742 12/30/2016 1800 Full Code [id number]  Kandyce Norleen DASEN, MD Inpatient   06/10/2016 1329 06/11/2016 1542 Full Code [id number]  Fernand Denyse LABOR, MD Inpatient   06/10/2016 1208  06/10/2016 1329 Full Code [id number]  Barbette Cea, MD Inpatient         IV Access:   Peripheral IV   Procedures and diagnostic studies:   ECHOCARDIOGRAM COMPLETE Result Date: 03/21/2024    ECHOCARDIOGRAM REPORT   Patient Name:   Keith Gibbs Date of Exam: 03/21/2024 Medical Rec #:  [id number]       Height:       75.0 in Accession #:    [id number]      Weight:       231.5 lb Date of Birth:  1961-05-28       BSA:          2.335 m Patient Age:    63 years        BP:           124/65 mmHg Patient Gender: M               HR:           58 bpm. Exam Location:  Inpatient Procedure: 2D Echo, Cardiac Doppler and Color Doppler (Both Spectral and Color            Flow Doppler were utilized during procedure). Indications:    Dyspnea R06.00  History:        Patient has no prior history of Echocardiogram examinations.                 CAD, Arrythmias:Bradycardia; Risk Factors:Dyslipidemia and                 Hypertension.  Sonographer:    BERNARDA ROCKS Referring Phys: [id number] ROLLO JONELLE LOUDER IMPRESSIONS  1. Left ventricular ejection fraction, by estimation, is 60 to 65%. The left ventricle has normal function. The left ventricle has no regional wall motion abnormalities. Left ventricular diastolic parameters were normal.  2. Right ventricular systolic function is normal. The right ventricular size is normal.  3. The mitral valve is normal in structure. No evidence of mitral valve regurgitation. No evidence of mitral stenosis.  4. The aortic valve is normal in structure. Aortic valve regurgitation is mild. No aortic stenosis is present.  5. Aortic dilatation noted. 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  6. The inferior vena cava is dilated in size with <50% respiratory variability, suggesting right atrial pressure of 15 mmHg. FINDINGS  Left Ventricle: Left ventricular ejection fraction, by estimation, is 60 to 65%. The left ventricle  has normal function. The left ventricle has no regional wall motion abnormalities. The left ventricular internal cavity size was normal in size. There is  no left ventricular hypertrophy. Left ventricular diastolic parameters were normal. Right Ventricle: The right ventricular size is normal. No increase in right ventricular wall thickness. Right ventricular systolic function is normal. Left Atrium: Left atrial size was normal in size. Right Atrium: Right atrial size was normal in size. Pericardium: There is no evidence of pericardial effusion. Mitral Valve: The mitral valve is normal in structure. No evidence of mitral valve regurgitation. No evidence of mitral valve stenosis. MV peak gradient, 5.3 mmHg. The mean  mitral valve gradient is 2.0 mmHg. Tricuspid Valve: The tricuspid valve is normal in structure. Tricuspid valve regurgitation is not demonstrated. No evidence of tricuspid stenosis. Aortic Valve: The aortic valve is normal in structure. Aortic valve regurgitation is mild. Aortic regurgitation PHT measures 692 msec. No aortic stenosis is present. Aortic valve mean gradient measures 7.5 mmHg. Aortic valve peak gradient measures 17.1 mmHg. Aortic valve area, by VTI measures 3.91 cm. Pulmonic Valve: The pulmonic valve was normal in structure. Pulmonic valve regurgitation is not visualized. No evidence of pulmonic stenosis. Aorta: Aortic dilatation noted. 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 Venous: The inferior vena cava is dilated in size with less than 50% respiratory variability, suggesting right atrial pressure of 15 mmHg. IAS/Shunts: No atrial level shunt detected by color flow Doppler.  LEFT VENTRICLE PLAX 2D LVIDd:         4.95 cm      Diastology LVIDs:         3.60 cm      LV e' medial:    9.68 cm/s LV PW:         0.80 cm      LV E/e' medial:  9.3 LV IVS:        1.00 cm      LV e' lateral:   13.80 cm/s LVOT diam:     2.30  cm      LV E/e' lateral: 6.5 LV SV:         174 LV SV Index:   74 LVOT Area:     4.15 cm  LV Volumes (MOD) LV vol d, MOD A2C: 209.0 ml LV vol d, MOD A4C: 185.0 ml LV vol s, MOD A2C: 57.1 ml LV vol s, MOD A4C: 63.6 ml LV SV MOD A2C:     151.9 ml LV SV MOD A4C:     185.0 ml LV SV MOD BP:      142.0 ml RIGHT VENTRICLE RV Basal diam:  4.10 cm RV S prime:     16.40 cm/s TAPSE (M-mode): 3.4 cm LEFT ATRIUM             Index        RIGHT ATRIUM           Index LA diam:        4.10 cm 1.76 cm/m   RA Area:     16.50 cm LA Vol (A2C):   66.5 ml 28.48 ml/m  RA Volume:   46.00 ml  19.70 ml/m LA Vol (A4C):   87.4 ml 37.43 ml/m LA Biplane Vol: 79.1 ml 33.88 ml/m  AORTIC VALVE                     PULMONIC VALVE AV Area (Vmax):    3.61 cm      PV Vmax:          1.42 m/s AV Area (Vmean):   2.94 cm      PV Peak grad:     8.1 mmHg AV Area (VTI):     3.91 cm      PR End Diast Vel: 2.65 msec AV Vmax:           207.00 cm/s AV Vmean:          129.500 cm/s AV VTI:            0.444 m AV Peak Grad:  17.1 mmHg AV Mean Grad:      7.5 mmHg LVOT Vmax:         180.00 cm/s LVOT Vmean:        91.500 cm/s LVOT VTI:          0.418 m LVOT/AV VTI ratio: 0.94 AI PHT:            692 msec  AORTA Ao Root diam: 4.00 cm Ao Asc diam:  4.00 cm MITRAL VALVE MV Area (PHT): 3.45 cm     SHUNTS MV Area VTI:   3.85 cm     Systemic VTI:  0.42 m MV Peak grad:  5.3 mmHg     Systemic Diam: 2.30 cm MV Mean grad:  2.0 mmHg MV Vmax:       1.15 m/s MV Vmean:      60.3 cm/s MV Decel Time: 220 msec MV E velocity: 89.70 cm/s MV A velocity: 108.00 cm/s MV E/A ratio:  0.83 Aditya Sabharwal Electronically signed by Ria Commander Signature Date/Time: 03/21/2024/11:41:42 AM    Final    DG Chest Port 1 View Result Date: 03/20/2024 CLINICAL DATA:  Cough and wheezing. EXAM: PORTABLE CHEST 1 VIEW COMPARISON:  05/30/2023 and CT chest 02/29/2024. FINDINGS: Trachea is midline. Heart size stable. Lungs are emphysematous. Mild interstitial prominence and indistinctness  diffusely. No pleural fluid. Right shoulder arthroplasty. IMPRESSION: Probable mild pulmonary edema. Electronically Signed   By: Newell Eke M.D.   On: 03/20/2024 12:20     Medical Consultants:   None.   Subjective:    Keith Gibbs relates his breathing is improved this morning.  Objective:    Vitals:   03/21/24 1949 03/22/24 0253 03/22/24 0255 03/22/24 0718  BP:  (!) 111/56  113/62  Pulse:  (!) 47  (!) 51  Resp:  16  14  Temp:  98.5 F (36.9 C)  98.9 F (37.2 C)  TempSrc:  Oral  Oral  SpO2: 96%  94%   Weight:      Height:       SpO2: 94 %   Intake/Output Summary (Last 24 hours) at 03/22/2024 0734 Last data filed at 03/21/2024 2031 Gross per 24 hour  Intake 240 ml  Output 900 ml  Net -660 ml   Filed Weights   03/20/24 1125  Weight: 105 kg    Exam: General exam: In no acute distress. Respiratory system: Good air movement and still wheezing bilaterally Cardiovascular system: S1 & S2 heard, RRR. No JVD. Gastrointestinal system: Abdomen is nondistended, soft and nontender.  Extremities: No pedal edema. Skin: No rashes, lesions or ulcers Psychiatry: Judgement and insight appear normal. Mood & affect appropriate.  Data Reviewed:    Labs: Basic Metabolic Panel: Recent Labs  Lab 03/20/24 1201 03/21/24 0333 03/22/24 0347  NA 132* 133* 137  K 4.4 4.1 3.4*  CL 102 102 103  CO2 24 23 24   GLUCOSE 95 178* 128*  BUN 13 13 14   CREATININE 0.96 1.12 0.93  CALCIUM  8.5* 8.6* 8.7*   GFR Estimated Creatinine Clearance: 106.6 mL/min (by C-G formula based on SCr of 0.93 mg/dL). Liver Function Tests: Recent Labs  Lab 03/20/24 1201 03/21/24 0333  AST 17 18  ALT 10 10  ALKPHOS 48 48  BILITOT 0.8 0.5  PROT 5.7* 5.9*  ALBUMIN  3.3* 3.0*   No results for input(s): LIPASE, AMYLASE in the last 168 hours. Recent Labs  Lab 03/20/24 1201  AMMONIA 54*   Coagulation profile No results for input(s): INR,  PROTIME in the last 168 hours. COVID-19  Labs  No results for input(s): DDIMER, FERRITIN, LDH, CRP in the last 72 hours.  Lab Results  Component Value Date   SARSCOV2NAA NEGATIVE 03/20/2024   SARSCOV2NAA NEGATIVE 05/30/2023   SARSCOV2NAA NEGATIVE 08/05/2022   SARSCOV2NAA NEGATIVE 04/26/2022    CBC: Recent Labs  Lab 03/20/24 1201 03/21/24 0333 03/22/24 0347  WBC 6.6 11.4* 11.0*  HGB 13.6 14.0 13.0  HCT 39.8 40.6 37.3*  MCV 96.8 95.3 94.2  PLT 141* 132* 131*   Cardiac Enzymes: No results for input(s): CKTOTAL, CKMB, CKMBINDEX, TROPONINI in the last 168 hours. BNP (last 3 results) No results for input(s): PROBNP in the last 8760 hours. CBG: Recent Labs  Lab 03/21/24 1202 03/21/24 1550  GLUCAP 146* 169*   D-Dimer: No results for input(s): DDIMER in the last 72 hours. Hgb A1c: No results for input(s): HGBA1C in the last 72 hours. Lipid Profile: No results for input(s): CHOL, HDL, LDLCALC, TRIG, CHOLHDL, LDLDIRECT in the last 72 hours. Thyroid  function studies: Recent Labs    03/22/24 0347  TSH 0.289*   Anemia work up: No results for input(s): VITAMINB12, FOLATE, FERRITIN, TIBC, IRON, RETICCTPCT in the last 72 hours. Sepsis Labs: Recent Labs  Lab 03/20/24 1201 03/21/24 0333 03/22/24 0347  WBC 6.6 11.4* 11.0*   Microbiology Recent Results (from the past 240 hours)  Resp panel by RT-PCR (RSV, Flu A&B, Covid) Anterior Nasal Swab     Status: None   Collection Time: 03/20/24  2:53 PM   Specimen: Anterior Nasal Swab  Result Value Ref Range Status   SARS Coronavirus 2 by RT PCR NEGATIVE NEGATIVE Final   Influenza A by PCR NEGATIVE NEGATIVE Final   Influenza B by PCR NEGATIVE NEGATIVE Final    Comment: (NOTE) The Xpert Xpress SARS-CoV-2/FLU/RSV plus assay is intended as an aid in the diagnosis of influenza from Nasopharyngeal swab specimens and should not be used as a sole basis for treatment. Nasal washings and aspirates are unacceptable for Xpert Xpress  SARS-CoV-2/FLU/RSV testing.  Fact Sheet for Patients: BloggerCourse.com  Fact Sheet for Healthcare Providers: SeriousBroker.it  This test is not yet approved or cleared by the [place]  FDA and has been authorized for detection and/or diagnosis of SARS-CoV-2 by FDA under an Emergency Use Authorization (EUA). This EUA will remain in effect (meaning this test can be used) for the duration of the COVID-19 declaration under Section 564(b)(1) of the Act, 21 U.S.C. section 360bbb-3(b)(1), unless the authorization is terminated or revoked.     Resp Syncytial Virus by PCR NEGATIVE NEGATIVE Final    Comment: (NOTE) Fact Sheet for Patients: BloggerCourse.com  Fact Sheet for Healthcare Providers: SeriousBroker.it  This test is not yet approved or cleared by the [place]  FDA and has been authorized for detection and/or diagnosis of SARS-CoV-2 by FDA under an Emergency Use Authorization (EUA). This EUA will remain in effect (meaning this test can be used) for the duration of the COVID-19 declaration under Section 564(b)(1) of the Act, 21 U.S.C. section 360bbb-3(b)(1), unless the authorization is terminated or revoked.  Performed at [facility], [address] [place], [place] [address]      Medications:    albuterol   5 mg Nebulization Q6H   aspirin  EC  81 mg Oral Daily   atorvastatin   80 mg Oral Daily   budesonide -glycopyrrolate -formoterol   2 puff Inhalation BID   divalproex   1,500 mg Oral QHS   divalproex   250 mg Oral Daily  DULoxetine   90 mg Oral Daily   enoxaparin  (LOVENOX ) injection  40 mg Subcutaneous Q24H   gabapentin   600 mg Oral Daily   And   gabapentin   1,200 mg Oral QHS   isosorbide  mononitrate  30 mg Oral Daily   pantoprazole   40 mg Oral Daily   predniSONE   50 mg Oral Q breakfast   QUEtiapine   300 mg Oral QHS   sodium chloride  flush  3 mL  Intravenous Q12H   traZODone   100 mg Oral QHS   Continuous Infusions:  doxycycline  (VIBRAMYCIN ) IV 100 mg (03/21/24 2036)      LOS: 0 days   Keith Gibbs  [facility]  03/22/2024, 7:34 AM

## 2024-03-22 NOTE — Plan of Care (Signed)

## 2024-03-22 NOTE — Consult Note (Signed)
 NAME:  Keith Gibbs, MRN:  [id number], DOB:  12-11-1960, LOS: 0 ADMISSION DATE:  03/20/2024, CONSULTATION DATE:  03/22/24 REFERRING MD:  TRH, CHIEF COMPLAINT:  DOE   History of Present Illness:  63 year old presents ED with bradycardia and shortness of breath.  Chest x-ray with bilateral interstitial infiltrates.  Concerning for pulmonary edema versus atypical infection.  Also reportedly wheezing.  Given antibiotics and steroids.  Improved.  Weaned off oxygen.  Now on room air.  Cardiology consulting for bradycardia.  PCCM was consulted given known lung nodule with upcoming appointment already scheduled for consideration of inpatient evaluation.  Pertinent  Medical History  Tobacco abuse  Significant Hospital Events: Including procedures, antibiotic start and stop dates in addition to other pertinent events   8/27 admitted to the hospital with worsening shortness and bradycardia  Interim History / Subjective:  Per chart review feeling better, on room air  Objective    Blood pressure (!) 144/60, pulse (!) 56, temperature 97.6 F (36.4 C), temperature source Oral, resp. rate 18, height 6' 3 (1.905 m), weight 105 kg, SpO2 97%.        Intake/Output Summary (Last 24 hours) at 03/22/2024 1211 Last data filed at 03/21/2024 2031 Gross per 24 hour  Intake 240 ml  Output 900 ml  Net -660 ml   Filed Weights   03/20/24 1125  Weight: 105 kg    Examination: Deferred  Resolved problem list   Assessment and Plan   Lung nodule: Upcoming appointment with bronchoscopies in clinic 9/2.  Can discussed options including biopsy at that time.  I really, this would be performed once at steady state, after current exacerbation well treated.  No role for bronchoscopy now, would not let delaying bronchoscopy that would occur if done inpatient for discharge in the coming days given good outpatient follow-up already established. -- Encourage to follow-up with pulmonary clinic already  planned/scheduled appointment 9/2  COPD with exacerbation: Continue current treatment, seems like he is improved.  Labs   CBC: Recent Labs  Lab 03/20/24 1201 03/21/24 0333 03/22/24 0347  WBC 6.6 11.4* 11.0*  HGB 13.6 14.0 13.0  HCT 39.8 40.6 37.3*  MCV 96.8 95.3 94.2  PLT 141* 132* 131*    Basic Metabolic Panel: Recent Labs  Lab 03/20/24 1201 03/21/24 0333 03/22/24 0347  NA 132* 133* 137  K 4.4 4.1 3.4*  CL 102 102 103  CO2 24 23 24   GLUCOSE 95 178* 128*  BUN 13 13 14   CREATININE 0.96 1.12 0.93  CALCIUM  8.5* 8.6* 8.7*   GFR: Estimated Creatinine Clearance: 106.6 mL/min (by C-G formula based on SCr of 0.93 mg/dL). Recent Labs  Lab 03/20/24 1201 03/21/24 0333 03/22/24 0347  WBC 6.6 11.4* 11.0*    Liver Function Tests: Recent Labs  Lab 03/20/24 1201 03/21/24 0333  AST 17 18  ALT 10 10  ALKPHOS 48 48  BILITOT 0.8 0.5  PROT 5.7* 5.9*  ALBUMIN  3.3* 3.0*   No results for input(s): LIPASE, AMYLASE in the last 168 hours. Recent Labs  Lab 03/20/24 1201  AMMONIA 54*    ABG    Component Value Date/Time   PHART 7.355 08/23/2022 1326   PCO2ART 47.3 08/23/2022 1326   PO2ART 277 (H) 08/23/2022 1326   HCO3 29.0 (H) 03/20/2024 1236   TCO2 28 05/30/2023 1229   ACIDBASEDEF 1.0 02/17/2022 0502   O2SAT 80.8 03/20/2024 1236     Coagulation Profile: No results for input(s): INR, PROTIME in the last 168 hours.  Cardiac Enzymes: No results for input(s): CKTOTAL, CKMB, CKMBINDEX, TROPONINI in the last 168 hours.  HbA1C: Hgb A1c MFr Bld  Date/Time Value Ref Range Status  02/12/2024 08:40 AM 5.3 4.8 - 5.6 % Final    Comment:             Prediabetes: 5.7 - 6.4          Diabetes: >6.4          Glycemic control for adults with diabetes: <7.0   04/26/2022 03:30 PM 5.2 4.8 - 5.6 % Final    Comment:    (NOTE) Pre diabetes:          5.7%-6.4%  Diabetes:              >6.4%  Glycemic control for   <7.0% adults with diabetes      CBG: Recent Labs  Lab 03/21/24 1202 03/21/24 1550  GLUCAP 146* 169*    Review of Systems:   N/a  Past Medical History:  He,  has a past medical history of Anginal pain (HCC), Anxiety, Arthritis, Bipolar 1 disorder (HCC), Bursitis, CAD (coronary artery disease), Chronic pain, COPD (chronic obstructive pulmonary disease) (HCC), Current use of long term anticoagulation, Depression, Diverticulitis, Dyspnea, GERD (gastroesophageal reflux disease), Grade I diastolic dysfunction, Hepatitis C (2012), HLD (hyperlipidemia), Hypertension, MI (myocardial infarction) (HCC), Polysubstance abuse (HCC), PUD (peptic ulcer disease), S/P angioplasty with stent (06/10/2016), S/P PTCA (percutaneous transluminal coronary angioplasty) (12/04/2019), Schizophrenia (HCC), Stroke (HCC), and Valvular insufficiency.   Surgical History:   Past Surgical History:  Procedure Laterality Date   ABDOMINAL SURGERY     removed small piece of intestines due to [facility] Diverticulosis   ANTERIOR LAT LUMBAR FUSION N/A 08/23/2022   Procedure: DIRECT LATERAL INTERBODY FUSION  LUMBAR TWO- LUMBAR THREE, LUMBAR THREE-LUMBAR FOUR , EXPLORE AND EXTEND FUSION LUMBAR TWO-LUMBAR FIVE, POSTERIOR DECOMPRESSION LUMBAR THREE-LUMBAR FOUR, LEFT LUMBAR TWO-LUMBAR THREE;  Surgeon: Debby Dorn MATSU, MD;  Location: [facility] OR;  Service: Neurosurgery;  Laterality: N/A;   APPENDECTOMY     BACK SURGERY     CARDIAC CATHETERIZATION Left 06/10/2016   Procedure: Left Heart Cath and Coronary Angiography;  Surgeon: Denyse DELENA Bathe, MD;  Location: [facility] INVASIVE CV LAB;  Service: Cardiovascular;  Laterality: Left;   CARDIAC CATHETERIZATION N/A 06/10/2016   Procedure: Coronary Stent Intervention;  Surgeon: Cara JONETTA Lovelace, MD;  Location: [facility] INVASIVE CV LAB;  Service: Cardiovascular;  Laterality: N/A;   CHOLECYSTECTOMY N/A 02/17/2022   Procedure: LAPAROSCOPIC CHOLECYSTECTOMY;  Surgeon: Stevie Herlene Righter, MD;  Location: [facility] OR;  Service: General;   Laterality: N/A;   COLON SURGERY     COLONOSCOPY     COLONOSCOPY WITH PROPOFOL  N/A 01/05/2017   Procedure: COLONOSCOPY WITH PROPOFOL ;  Surgeon: Therisa Bi, MD;  Location: [facility] ENDOSCOPY;  Service: Endoscopy;  Laterality: N/A;   COLONOSCOPY WITH PROPOFOL  N/A 02/13/2020   Procedure: COLONOSCOPY WITH PROPOFOL ;  Surgeon: Janalyn Keene NOVAK, MD;  Location: [facility] ENDOSCOPY;  Service: Endoscopy;  Laterality: N/A;   CORONARY ANGIOPLASTY WITH STENT PLACEMENT     CORONARY PRESSURE/FFR STUDY N/A 12/04/2019   Procedure: INTRAVASCULAR PRESSURE WIRE/FFR STUDY;  Surgeon: Wonda Sharper, MD;  Location: [facility] INVASIVE CV LAB;  Service: Cardiovascular;  Laterality: N/A;   ESOPHAGOGASTRODUODENOSCOPY (EGD) WITH PROPOFOL  N/A 01/05/2017   Procedure: ESOPHAGOGASTRODUODENOSCOPY (EGD) WITH PROPOFOL ;  Surgeon: Therisa Bi, MD;  Location: [facility] ENDOSCOPY;  Service: Endoscopy;  Laterality: N/A;   ESOPHAGOGASTRODUODENOSCOPY (EGD) WITH PROPOFOL  N/A 02/13/2020   Procedure: ESOPHAGOGASTRODUODENOSCOPY (EGD) WITH PROPOFOL ;  Surgeon: Tahiliani, Varnita  B, MD;  Location: [facility] ENDOSCOPY;  Service: Endoscopy;  Laterality: N/A;   KNEE ARTHROSCOPY WITH MEDIAL MENISECTOMY Right 09/05/2017   Procedure: KNEE ARTHROSCOPY WITH MEDIAL AND LATERAL  MENISECTOMY PARTIAL SYNOVECTOMY;  Surgeon: Kathlynn Sharper, MD;  Location: [facility] ORS;  Service: Orthopedics;  Laterality: Right;   LEFT HEART CATH AND CORONARY ANGIOGRAPHY N/A 12/04/2019   Procedure: LEFT HEART CATH AND CORONARY ANGIOGRAPHY;  Surgeon: Wonda Sharper, MD;  Location: [facility] INVASIVE CV LAB;  Service: Cardiovascular;  Laterality: N/A;   LEFT HEART CATH AND CORONARY ANGIOGRAPHY Left 11/25/2022   Procedure: LEFT HEART CATH AND CORONARY ANGIOGRAPHY;  Surgeon: Fernand Denyse LABOR, MD;  Location: [facility] INVASIVE CV LAB;  Service: Cardiovascular;  Laterality: Left;   REVERSE SHOULDER ARTHROPLASTY Right 08/31/2023   Procedure: RIGHT REVERSE SHOULDER ARTHROPLASTY;  Surgeon: [name], MD;   Location[facility];  Service: Orthopedics;  Laterality: Right;   SHOULDER SURGERY Right 04/09/2012   SPINE SURGERY     TOTAL KNEE ARTHROPLASTY Right 01/19/2021   Procedure: TOTAL KNEE ARTHROPLASTY - [place] Amber to Assist;  Surgeon: Kathlynn Sharper, MD;  Location: [facility] ORS;  Service: Orthopedics;  Laterality: Right;     Social History:   reports that he quit smoking about 25 years ago. His smoking use included cigarettes. He started smoking about 45 years ago. He has a 63.6 pack-year smoking history. He has been exposed to tobacco smoke. He has never used smokeless tobacco. He reports that he does not currently use alcohol  after a past usage of about 3.0 standard drinks of alcohol  per week. He reports that he does not currently use drugs after having used the following drugs: Cocaine and Marijuana.   Family History:  His family history includes Depression in his mother; Early death in his father; Heart attack in his father; Heart disease in his father and mother; Hypertension in his father, mother, and sister; Osteoarthritis in his mother; Parkinson's disease in his maternal grandfather. There is no history of Prostate cancer, Bladder Cancer, Kidney cancer, or Tremor.   Allergies Allergies  Allergen Reactions   Asenapine Other (See Comments) and Nausea And Vomiting    Increased tremors   Dextromethorphan Hbr    Guaifenesin     Latuda [Lurasidone Hcl] Other (See Comments)    Tremors     Lurasidone Other (See Comments) and Hives    Other Reaction(s): Angioedema   Phenylephrine       Home Medications  Prior to Admission medications   Medication Sig Start Date End Date Taking? Authorizing Provider  acetaminophen  (TYLENOL ) 325 MG tablet Take 1 tablet (325 mg total) by mouth every 6 (six) hours as needed for mild pain (pain score 1-3) (or temp > 100.5). 09/01/23  Yes [name], MD  albuterol  (VENTOLIN  HFA) 108 [phone] Base) MCG/ACT inhaler Inhale 2 puffs into the lungs every 6 (six) hours as  needed for wheezing or shortness of breath. 12/19/23  Yes Lorren Greig PARAS, NP  aspirin  EC 81 MG tablet Take 1 tablet (81 mg total) by mouth daily. Swallow whole. Patient taking differently: Take 81 mg by mouth in the morning. Swallow whole. 02/03/23  Yes Evelena Figures, MD  atorvastatin  (LIPITOR ) 80 MG tablet TAKE 1 TABLET BY MOUTH EVERY DAY Patient taking differently: Take 80 mg by mouth in the morning. 01/15/24  Yes [name], MD  budesonide -glycopyrrolate -formoterol  (BREZTRI  AEROSPHERE) 160-9-4.8 MCG/ACT AERO inhaler Inhale 2 puffs into the lungs in the morning and at bedtime. 12/19/23  Yes Lorren Greig PARAS, NP  celecoxib  (  CELEBREX ) 100 MG capsule Take 2 capsules (200 mg total) by mouth 2 (two) times daily. 01/22/24  Yes Magnant, Carlin CROME, PA-C  divalproex  (DEPAKOTE ) 500 MG DR tablet Take half tablet in the morning and 3 tablets at bedtime 02/08/24  Yes Hoang, Daniela B, MD  DULoxetine  (CYMBALTA ) 30 MG capsule Take 3 capsules (90 mg total) by mouth daily. Patient taking differently: Take 30-60 mg by mouth See admin instructions. 30mg  qam, 60mg  qhs 02/08/24  Yes Izella Ismael NOVAK, MD  gabapentin  (NEURONTIN ) 600 MG tablet Take 1 tablet (600 mg total) by mouth daily AND 2 tablets (1,200 mg total) at bedtime. 02/08/24  Yes Izella Ismael NOVAK, MD  hydrOXYzine  (ATARAX ) 25 MG tablet Take 1 tablet (25 mg total) by mouth 2 (two) times daily as needed. 02/08/24  Yes Izella Ismael NOVAK, MD  isosorbide  mononitrate (IMDUR ) 30 MG 24 hr tablet Take 1 tablet (30 mg total) by mouth 2 (two) times daily. 02/16/24  Yes Fernand Denyse LABOR, MD  Multiple Vitamins-Minerals (MENS 50+ MULTIVITAMIN) TABS Take 1 tablet by mouth in the morning.   Yes [provider]  nitroGLYCERIN  (NITROSTAT ) 0.4 MG SL tablet Place 1 tablet (0.4 mg total) under the tongue every 5 (five) minutes as needed for chest pain. 02/03/23 03/20/24 Yes Evelena Figures, MD  pantoprazole  (PROTONIX ) 40 MG tablet Take 1 tablet (40 mg total) by mouth daily. Patient  taking differently: Take 40 mg by mouth in the morning. 12/19/23  Yes Lorren, Amy J, NP  QUEtiapine  (SEROQUEL ) 200 MG tablet Take 1.5 tablets (300 mg total) by mouth at bedtime. 02/08/24  Yes Izella Ismael NOVAK, MD  traZODone  (DESYREL ) 100 MG tablet Take 1 tablet (100 mg total) by mouth at bedtime. 02/08/24  Yes Izella Ismael NOVAK, MD     Critical care time: n/a    Donnice JONELLE Beals, MD See TRACEY

## 2024-03-22 NOTE — Progress Notes (Addendum)
 Progress Note  Patient Name: Keith Gibbs Date of Encounter: 03/22/2024  Primary Cardiologist: Dr. Dedra Bathe  Subjective   No CP, breathing somewhat better.  Inpatient Medications    Scheduled Meds:  albuterol   5 mg Nebulization Q6H   aspirin  EC  81 mg Oral Daily   atorvastatin   80 mg Oral Daily   budesonide -glycopyrrolate -formoterol   2 puff Inhalation BID   divalproex   1,500 mg Oral QHS   divalproex   250 mg Oral Daily   doxycycline   100 mg Oral Q12H   DULoxetine   90 mg Oral Daily   enoxaparin  (LOVENOX ) injection  40 mg Subcutaneous Q24H   gabapentin   600 mg Oral Daily   And   gabapentin   1,200 mg Oral QHS   isosorbide  mononitrate  30 mg Oral Daily   pantoprazole   40 mg Oral Daily   [START ON 03/23/2024] predniSONE   50 mg Oral Q breakfast   QUEtiapine   300 mg Oral QHS   sodium chloride  flush  3 mL Intravenous Q12H   traZODone   100 mg Oral QHS   Continuous Infusions:  PRN Meds: acetaminophen  **OR** acetaminophen , albuterol , hydrOXYzine , polyethylene glycol   Vital Signs    Vitals:   03/21/24 1949 03/22/24 0253 03/22/24 0255 03/22/24 0718  BP:  (!) 111/56  113/62  Pulse:  (!) 47  (!) 51  Resp:  16  14  Temp:  98.5 F (36.9 C)  98.9 F (37.2 C)  TempSrc:  Oral  Oral  SpO2: 96%  94%   Weight:      Height:        Intake/Output Summary (Last 24 hours) at 03/22/2024 0917 Last data filed at 03/21/2024 2031 Gross per 24 hour  Intake 240 ml  Output 900 ml  Net -660 ml      03/20/2024   11:25 AM 03/18/2024    2:58 PM 02/16/2024   11:41 AM  Last 3 Weights  Weight (lbs) 231 lb 7.7 oz 233 lb 230 lb  Weight (kg) 105 kg 105.688 kg 104.327 kg     Telemetry    NSR - Personally Reviewed  Physical Exam   GEN: No acute distress.  HEENT: Normocephalic, atraumatic, sclera non-icteric. Neck: No JVD or bruits. Cardiac: RRR no murmurs, rubs, or gallops.  Respiratory: Rhonchorous bilaterally. No wheezing. Breathing is unlabored. GI: Soft, nontender,  non-distended, BS +x 4. MS: no deformity. Extremities: No clubbing or cyanosis. No edema. Distal pedal pulses are 2+ and equal bilaterally. Neuro:  AAOx3. Follows commands. Psych:  Responds to questions appropriately with a normal affect.  Labs    High Sensitivity Troponin:   Recent Labs  Lab 03/20/24 1201 03/20/24 1330  TROPONINIHS 4 3      Cardiac EnzymesNo results for input(s): TROPONINI in the last 168 hours. No results for input(s): TROPIPOC in the last 168 hours.   Chemistry Recent Labs  Lab 03/20/24 1201 03/21/24 0333 03/22/24 0347  NA 132* 133* 137  K 4.4 4.1 3.4*  CL 102 102 103  CO2 24 23 24   GLUCOSE 95 178* 128*  BUN 13 13 14   CREATININE 0.96 1.12 0.93  CALCIUM  8.5* 8.6* 8.7*  PROT 5.7* 5.9*  --   ALBUMIN  3.3* 3.0*  --   AST 17 18  --   ALT 10 10  --   ALKPHOS 48 48  --   BILITOT 0.8 0.5  --   GFRNONAA >60 >60 >60  ANIONGAP 6 8 10      Hematology Recent Labs  Lab 03/20/24 1201 03/21/24 0333 03/22/24 0347  WBC 6.6 11.4* 11.0*  RBC 4.11* 4.26 3.96*  HGB 13.6 14.0 13.0  HCT 39.8 40.6 37.3*  MCV 96.8 95.3 94.2  MCH 33.1 32.9 32.8  MCHC 34.2 34.5 34.9  RDW 13.5 13.3 13.7  PLT 141* 132* 131*    BNP Recent Labs  Lab 03/20/24 1419  BNP 20.6     DDimer No results for input(s): DDIMER in the last 168 hours.   Radiology    ECHOCARDIOGRAM COMPLETE Result Date: 03/21/2024    ECHOCARDIOGRAM REPORT   Patient Name:   VUE PAVON Date of Exam: 03/21/2024 Medical Rec #:  [id number]       Height:       75.0 in Accession #:    [id number]      Weight:       231.5 lb Date of Birth:  Sep 09, 1960       BSA:          2.335 m Patient Age:    63 years        BP:           124/65 mmHg Patient Gender: M               HR:           58 bpm. Exam Location:  Inpatient Procedure: 2D Echo, Cardiac Doppler and Color Doppler (Both Spectral and Color            Flow Doppler were utilized during procedure). Indications:    Dyspnea R06.00  History:        Patient has  no prior history of Echocardiogram examinations.                 CAD, Arrythmias:Bradycardia; Risk Factors:Dyslipidemia and                 Hypertension.  Sonographer:    BERNARDA ROCKS Referring Phys: [id number] ROLLO JONELLE LOUDER IMPRESSIONS  1. Left ventricular ejection fraction, by estimation, is 60 to 65%. The left ventricle has normal function. The left ventricle has no regional wall motion abnormalities. Left ventricular diastolic parameters were normal.  2. Right ventricular systolic function is normal. The right ventricular size is normal.  3. The mitral valve is normal in structure. No evidence of mitral valve regurgitation. No evidence of mitral stenosis.  4. The aortic valve is normal in structure. Aortic valve regurgitation is mild. No aortic stenosis is present.  5. Aortic dilatation noted. 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  6. The inferior vena cava is dilated in size with <50% respiratory variability, suggesting right atrial pressure of 15 mmHg. FINDINGS  Left Ventricle: Left ventricular ejection fraction, by estimation, is 60 to 65%. The left ventricle has normal function. The left ventricle has no regional wall motion abnormalities. The left ventricular internal cavity size was normal in size. There is  no left ventricular hypertrophy. Left ventricular diastolic parameters were normal. Right Ventricle: The right ventricular size is normal. No increase in right ventricular wall thickness. Right ventricular systolic function is normal. Left Atrium: Left atrial size was normal in size. Right Atrium: Right atrial size was normal in size. Pericardium: There is no evidence of pericardial effusion. Mitral Valve: The mitral valve is normal in structure. No evidence of mitral valve regurgitation. No evidence of mitral valve stenosis. MV peak gradient, 5.3 mmHg. The mean mitral valve gradient  is 2.0 mmHg. Tricuspid Valve: The  tricuspid valve is normal in structure. Tricuspid valve regurgitation is not demonstrated. No evidence of tricuspid stenosis. Aortic Valve: The aortic valve is normal in structure. Aortic valve regurgitation is mild. Aortic regurgitation PHT measures 692 msec. No aortic stenosis is present. Aortic valve mean gradient measures 7.5 mmHg. Aortic valve peak gradient measures 17.1 mmHg. Aortic valve area, by VTI measures 3.91 cm. Pulmonic Valve: The pulmonic valve was normal in structure. Pulmonic valve regurgitation is not visualized. No evidence of pulmonic stenosis. Aorta: Aortic dilatation noted. 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 Venous: The inferior vena cava is dilated in size with less than 50% respiratory variability, suggesting right atrial pressure of 15 mmHg. IAS/Shunts: No atrial level shunt detected by color flow Doppler.  LEFT VENTRICLE PLAX 2D LVIDd:         4.95 cm      Diastology LVIDs:         3.60 cm      LV e' medial:    9.68 cm/s LV PW:         0.80 cm      LV E/e' medial:  9.3 LV IVS:        1.00 cm      LV e' lateral:   13.80 cm/s LVOT diam:     2.30 cm      LV E/e' lateral: 6.5 LV SV:         174 LV SV Index:   74 LVOT Area:     4.15 cm  LV Volumes (MOD) LV vol d, MOD A2C: 209.0 ml LV vol d, MOD A4C: 185.0 ml LV vol s, MOD A2C: 57.1 ml LV vol s, MOD A4C: 63.6 ml LV SV MOD A2C:     151.9 ml LV SV MOD A4C:     185.0 ml LV SV MOD BP:      142.0 ml RIGHT VENTRICLE RV Basal diam:  4.10 cm RV S prime:     16.40 cm/s TAPSE (M-mode): 3.4 cm LEFT ATRIUM             Index        RIGHT ATRIUM           Index LA diam:        4.10 cm 1.76 cm/m   RA Area:     16.50 cm LA Vol (A2C):   66.5 ml 28.48 ml/m  RA Volume:   46.00 ml  19.70 ml/m LA Vol (A4C):   87.4 ml 37.43 ml/m LA Biplane Vol: 79.1 ml 33.88 ml/m  AORTIC VALVE                     PULMONIC VALVE AV Area (Vmax):    3.61 cm      PV Vmax:          1.42 m/s AV Area  (Vmean):   2.94 cm      PV Peak grad:     8.1 mmHg AV Area (VTI):     3.91 cm      PR End Diast Vel: 2.65 msec AV Vmax:           207.00 cm/s AV Vmean:          129.500 cm/s AV VTI:            0.444 m AV Peak Grad:  17.1 mmHg AV Mean Grad:      7.5 mmHg LVOT Vmax:         180.00 cm/s LVOT Vmean:        91.500 cm/s LVOT VTI:          0.418 m LVOT/AV VTI ratio: 0.94 AI PHT:            692 msec  AORTA Ao Root diam: 4.00 cm Ao Asc diam:  4.00 cm MITRAL VALVE MV Area (PHT): 3.45 cm     SHUNTS MV Area VTI:   3.85 cm     Systemic VTI:  0.42 m MV Peak grad:  5.3 mmHg     Systemic Diam: 2.30 cm MV Mean grad:  2.0 mmHg MV Vmax:       1.15 m/s MV Vmean:      60.3 cm/s MV Decel Time: 220 msec MV E velocity: 89.70 cm/s MV A velocity: 108.00 cm/s MV E/A ratio:  0.83 Aditya Sabharwal Electronically signed by Ria Commander Signature Date/Time: 03/21/2024/11:41:42 AM    Final    DG Chest Port 1 View Result Date: 03/20/2024 CLINICAL DATA:  Cough and wheezing. EXAM: PORTABLE CHEST 1 VIEW COMPARISON:  05/30/2023 and CT chest 02/29/2024. FINDINGS: Trachea is midline. Heart size stable. Lungs are emphysematous. Mild interstitial prominence and indistinctness diffusely. No pleural fluid. Right shoulder arthroplasty. IMPRESSION: Probable mild pulmonary edema. Electronically Signed   By: Newell Eke M.D.   On: 03/20/2024 12:20    Cardiac Studies   2d echo 03/21/24 1. Left ventricular ejection fraction, by estimation, is 60 to 65%. The left ventricle has normal function. The left ventricle has no regional  wall motion abnormalities. Left ventricular diastolic parameters were normal.   2. Right ventricular systolic function is normal. The right ventricular size is normal.   3. The mitral valve is normal in structure. No evidence of mitral valve regurgitation. No evidence of mitral stenosis.   4. The aortic valve is normal in structure. Aortic valve regurgitation is mild. No aortic stenosis is present.   5. Aortic  dilatation noted. 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   6. The inferior vena cava is dilated in size with <50% respiratory variability, suggesting right atrial pressure of 15 mmHg.   Patient Profile     63 y.o. male with concerning spiculated pulmonary nodule, HTN, HLD, COPD, GERD, CAD s/p DES to LAD 2017, 60% ISR 2021 tx with PTCA, cirrhosis, schizoaffective disorder, depression, anxiety, bipolar, alcohol  use, marijuana and cocaine use, chronic hepatitis C, chronic pain. Last nuc 01/2024 anterior/apical perfusion defect in the stress images that is partially reversible in the rest images, EF 47%, evidence of anterior apical hypokinesia, global ventricular systolic function - ? Started on Imdur  at that time. Admitted with lethargy and change in mood, prompting EMS eval. With EMS was bradycardic in the mid 40s. He also had some SOB/dizziness and chest pressure, prompting cardiology evaluation. CXR raised question of new pulmonary edema but BNP wnl. New productive cough.   Assessment & Plan    1. Chest pain, SOB, cough - hsTroponins negative, CXR raised question of pulmonary edema but BNP was stone cold normal and echo shows normal LV function/normal RV function/normal diastolic function so suspect other process on CXR  - suspect noncardiac, query related to enlarging spiculated lung mass concerning for lung CA, COPD - being tx for AECOPD, primary team plans to involve pulmonology for  bronch - see below re: CAD   2. CAD s/p PCI 2017, 2021 - stress testing in 2024, 2025 with varying territories of ischemia, treated medically - last cath 11/2022 after abnormal stress test did not reveal any new stenosis, LVEF reported as 47% by 01/2024 nuc but normal by 01/2024 echo - anticipate continued medical therapy as he works up lung nodule  - continue ASA 81mg  daily - continue atorvastatin  80mg  day - continue Imdur  30mg  daily -  avoid BB given h/o sinus bradycardia   3. Chronic sinus bradycardia - HR in the 40s appears to go back many years - given stable blood pressures, no indication for PPM, suspect this can continue to be followed by his primary cardiologist - TSH suppressed -> further eval per primary team   4. Mild dilation of ascending aorta/aortic root - no intervention needed at this time, can f/u echo as OP in 1 year if appropriate  5. Hypokalemia - will order KCl x 1  For questions or updates, please contact [facility] Please consult [email or web address] for contact info under Cardiology/STEMI.  Signed, Raphael LOISE Bring, PA-C 03/22/2024, 9:17 AM    I have personally seen and examined the patient.  My HPI, Exam, and assessment and plan are below, independent of the NPP above.  No CP, SOB, palpitations or syncope.  Patient is worried about cancer diagnosis (possible).  Grandmother is at bedside.  Exam notable for  Gen: no distress   Neck: No JVD Cardiac: No Rubs or Gallops, no murmur, regular bradycardia +2 radial pulses Respiratory: Coarse breath sounds bilaterally GI: Soft, nontender, non-distended  MS: No  edema;  moves all extremities Integument: Skin feels warm Neuro:  At time of evaluation, alert and oriented to person/place/time/situation  Psych: Normal affect, patient feels well  Tele: Sinus bradycardia  In assessment and plan:  CAD with prior PCI and POBA- No evidence of cardiac chest pain, normal function, has recent eval by his IC, he was not taking IMDUR  PTA (though on his MAR) he is not on his therapy and without cardiac symptoms. ASA and stating, no plans for BB due to bradycardia; can hold ASA for bronch as needed Bradycardia- asymptomatic and chronic no PPM need Query of malignancy- no barriers to cardiotoxic chemotherapy Mild aortic dilation- repeat imaging in one year if needed  Will sign off.  Can f/u with OSH Cardiologist  Stanly Leavens, MD FASE  [facility] Cardiologist [facility]  [address] [place], [address] [place], [place] [address] [phone]  11:38 AM

## 2024-03-22 NOTE — Progress Notes (Signed)
 Mobility Specialist Progress Note:   03/22/24 1530  Mobility  Activity Ambulated independently  Level of Assistance Independent  Assistive Device None  Distance Ambulated (ft) 500 ft  Activity Response Tolerated well  Mobility Referral Yes  Mobility visit 1 Mobility  Mobility Specialist Start Time (ACUTE ONLY) 1520  Mobility Specialist Stop Time (ACUTE ONLY) 1530  Mobility Specialist Time Calculation (min) (ACUTE ONLY) 10 min   Pt agreeable to mobility session. Ambulated in hallway at and independent level. No c/o throughout. Left ambulating in hallway.  Therisa Rana Mobility Specialist Please contact via SecureChat or  Rehab office at [phone]

## 2024-03-23 ENCOUNTER — Other Ambulatory Visit (HOSPITAL_COMMUNITY): Payer: Self-pay

## 2024-03-23 DIAGNOSIS — J441 Chronic obstructive pulmonary disease with (acute) exacerbation: Secondary | ICD-10-CM | POA: Diagnosis not present

## 2024-03-23 DIAGNOSIS — N179 Acute kidney failure, unspecified: Secondary | ICD-10-CM

## 2024-03-23 MED ORDER — PREDNISONE 10 MG PO TABS
ORAL_TABLET | ORAL | 0 refills | Status: DC
  Filled 2024-03-23: qty 10, 4d supply, fill #0

## 2024-03-23 MED ORDER — DOXYCYCLINE HYCLATE 100 MG PO TABS
100.0000 mg | ORAL_TABLET | Freq: Two times a day (BID) | ORAL | 0 refills | Status: AC
  Filled 2024-03-23: qty 10, 5d supply, fill #0

## 2024-03-23 NOTE — Progress Notes (Signed)
 DC order noted per MD. DC RN at bedside with patient ready to discharge. Telemonitor not present, confirms mother will come to pickup the patient, unknown eta, agreeable to go to [facility]. PIVs removed on assessment. Skin intact. VSS. AVS printed/reviewed. TOC meds delivered to bedside. Patient wheeled downstairs to [facility].

## 2024-03-23 NOTE — Discharge Summary (Signed)
 Physician Discharge Summary  Keith Gibbs FMW:[id number] DOB: [birth year] DOA: 03/20/2024  PCP: Lorren Greig PARAS, NP  Admit date: 03/20/2024 Discharge date: 03/23/2024  Admitted From: Home Disposition:  Home  Recommendations for Outpatient Follow-up:  Follow up with PCP in 1-2 weeks Please obtain BMP/CBC in one week   Home Health:No Equipment/Devices:None  Discharge Condition:Stable CODE STATUS:Full Diet recommendation: Heart Healthy   Brief/Interim Summary: 63 y.o. male past medical history significant for essential hypertension CAD status post stents mid LAD's 2017, cirrhosis RLS, HFpEF, schizoaffective disorder, bipolar disorder alcohol  abuse polysubstance abuse including marijuana cocaine and opioid, chronic hepatitis C BPH, and undergoing evaluation for pulmonary nodule which has been enlarging comes in with weakness and lethargy was seen at behavioral health was found to be significantly bradycardic in the 40s, chest x-ray showed pulmonary edema, cardiology was consulted for symptomatic bradycardia   Discharge Diagnoses:  Principal Problem:   COPD with acute exacerbation (HCC) Active Problems:   CAD S/P percutaneous coronary angioplasty   Opiate use   Chronic hepatitis C (HCC)   GERD (gastroesophageal reflux disease)   Bipolar 1 disorder (HCC)   Alcohol  use disorder, moderate, dependence (HCC)   Chronic pain syndrome   Cirrhosis of liver (HCC)   BPH (benign prostatic hyperplasia)   Cocaine use   Anxiety   Essential hypertension   Hypercholesteremia   RLS (restless legs syndrome)   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HCC)   Iron deficiency anemia secondary to inadequate dietary iron intake   Major depressive disorder, recurrent severe without psychotic features (HCC)   Symptomatic bradycardia   AKI (acute kidney injury) (HCC)  Acute COPD exacerbation: Currently restarted on steroids antibiotics and inhalers. His wheezing improved he was weaned to room air. He will continue steroids  antibiotics and inhalers as an outpatient.  Symptomatic bradycardia: Cardiology was consulted related symptoms does not appear to be related to his bradycardia he is currently asymptomatic.  Enlarged spiculated right lower lobe nodule: Pulmonary was consulted recommended follow-up with them on 03/26/2022 to discuss option probable bronchoscopy.  Schizoaffective disorder/depression/bipolar disorder: No changes made to his medication.  Coronary artery disease: Continue current regimen no changes made to his medication.  Polysubstance abuse: UDS negative.  Cirrhosis/chronic hepatitis C: Noted.  EGD done in 2018 showed no varices   Discharge Instructions  Discharge Instructions     Diet - low sodium heart healthy   Complete by: As directed    Increase activity slowly   Complete by: As directed       Allergies as of 03/23/2024       Reactions   Asenapine Other (See Comments), Nausea And Vomiting   Increased tremors   Dextromethorphan Hbr    Guaifenesin     Latuda [lurasidone Hcl] Other (See Comments)   Tremors   Lurasidone Other (See Comments), Hives   Other Reaction(s): Angioedema   Phenylephrine          Medication List     TAKE these medications    acetaminophen  325 MG tablet Commonly known as: TYLENOL  Take 1 tablet (325 mg total) by mouth every 6 (six) hours as needed for mild pain (pain score 1-3) (or temp > 100.5).   albuterol  108 (90 Base) MCG/ACT inhaler Commonly known as: VENTOLIN  HFA Inhale 2 puffs into the lungs every 6 (six) hours as needed for wheezing or shortness of breath.   aspirin  EC 81 MG tablet Take 1 tablet (81 mg total) by mouth daily. Swallow whole. What changed: when to take this   atorvastatin  80  MG tablet Commonly known as: LIPITOR  TAKE 1 TABLET BY MOUTH EVERY DAY What changed: when to take this   Breztri  Aerosphere 160-9-4.8 MCG/ACT Aero inhaler Generic drug: budesonide -glycopyrrolate -formoterol  Inhale 2 puffs into the lungs in  the morning and at bedtime.   celecoxib  100 MG capsule Commonly known as: CELEBREX  Take 2 capsules (200 mg total) by mouth 2 (two) times daily.   divalproex  500 MG DR tablet Commonly known as: DEPAKOTE  Take half tablet in the morning and 3 tablets at bedtime   doxycycline  100 MG tablet Commonly known as: VIBRA -TABS Take 1 tablet (100 mg total) by mouth every 12 (twelve) hours for 5 days.   DULoxetine  30 MG capsule Commonly known as: CYMBALTA  Take 3 capsules (90 mg total) by mouth daily. What changed:  how much to take when to take this additional instructions   gabapentin  600 MG tablet Commonly known as: NEURONTIN  Take 1 tablet (600 mg total) by mouth daily AND 2 tablets (1,200 mg total) at bedtime.   hydrOXYzine  25 MG tablet Commonly known as: ATARAX  Take 1 tablet (25 mg total) by mouth 2 (two) times daily as needed.   isosorbide  mononitrate 30 MG 24 hr tablet Commonly known as: IMDUR  Take 1 tablet (30 mg total) by mouth 2 (two) times daily.   Mens 50+ Multivitamin Tabs Take 1 tablet by mouth in the morning.   nitroGLYCERIN  0.4 MG SL tablet Commonly known as: Nitrostat  Place 1 tablet (0.4 mg total) under the tongue every 5 (five) minutes as needed for chest pain.   pantoprazole  40 MG tablet Commonly known as: PROTONIX  Take 1 tablet (40 mg total) by mouth daily. What changed: when to take this   predniSONE  10 MG tablet Commonly known as: DELTASONE  Takes 4 tablets for 1 days, then 3 tablets for 1 days, then 2 tabs for 1 days, then 1 tab for 1 days, and then stop.   QUEtiapine  200 MG tablet Commonly known as: SEROquel  Take 1.5 tablets (300 mg total) by mouth at bedtime.   traZODone  100 MG tablet Commonly known as: DESYREL  Take 1 tablet (100 mg total) by mouth at bedtime.        Allergies  Allergen Reactions   Asenapine Other (See Comments) and Nausea And Vomiting    Increased tremors   Dextromethorphan Hbr    Guaifenesin     Latuda [Lurasidone Hcl]  Other (See Comments)    Tremors     Lurasidone Other (See Comments) and Hives    Other Reaction(s): Angioedema   Phenylephrine      Consultations: Cardiology Pulmonology   Procedures/Studies: ECHOCARDIOGRAM COMPLETE Result Date: 03/21/2024    ECHOCARDIOGRAM REPORT   Patient Name:   Keith Gibbs Date of Exam: 03/21/2024 Medical Rec #:  [id number]       Height:       75.0 in Accession #:    [id number]      Weight:       231.5 lb Date of Birth:  11-30-1960       BSA:          2.335 m Patient Age:    63 years        BP:           124/65 mmHg Patient Gender: M               HR:           58 bpm. Exam Location:  Inpatient Procedure: 2D Echo, Cardiac Doppler and Color Doppler (Both Spectral and  Color            Flow Doppler were utilized during procedure). Indications:    Dyspnea R06.00  History:        Patient has no prior history of Echocardiogram examinations.                 CAD, Arrythmias:Bradycardia; Risk Factors:Dyslipidemia and                 Hypertension.  Sonographer:    BERNARDA ROCKS Referring Phys: [id number] ROLLO JONELLE LOUDER IMPRESSIONS  1. Left ventricular ejection fraction, by estimation, is 60 to 65%. The left ventricle has normal function. The left ventricle has no regional wall motion abnormalities. Left ventricular diastolic parameters were normal.  2. Right ventricular systolic function is normal. The right ventricular size is normal.  3. The mitral valve is normal in structure. No evidence of mitral valve regurgitation. No evidence of mitral stenosis.  4. The aortic valve is normal in structure. Aortic valve regurgitation is mild. No aortic stenosis is present.  5. Aortic dilatation noted. 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  6. The inferior vena cava is dilated in size with <50% respiratory variability, suggesting right atrial pressure of 15 mmHg. FINDINGS  Left Ventricle: Left ventricular ejection  fraction, by estimation, is 60 to 65%. The left ventricle has normal function. The left ventricle has no regional wall motion abnormalities. The left ventricular internal cavity size was normal in size. There is  no left ventricular hypertrophy. Left ventricular diastolic parameters were normal. Right Ventricle: The right ventricular size is normal. No increase in right ventricular wall thickness. Right ventricular systolic function is normal. Left Atrium: Left atrial size was normal in size. Right Atrium: Right atrial size was normal in size. Pericardium: There is no evidence of pericardial effusion. Mitral Valve: The mitral valve is normal in structure. No evidence of mitral valve regurgitation. No evidence of mitral valve stenosis. MV peak gradient, 5.3 mmHg. The mean mitral valve gradient is 2.0 mmHg. Tricuspid Valve: The tricuspid valve is normal in structure. Tricuspid valve regurgitation is not demonstrated. No evidence of tricuspid stenosis. Aortic Valve: The aortic valve is normal in structure. Aortic valve regurgitation is mild. Aortic regurgitation PHT measures 692 msec. No aortic stenosis is present. Aortic valve mean gradient measures 7.5 mmHg. Aortic valve peak gradient measures 17.1 mmHg. Aortic valve area, by VTI measures 3.91 cm. Pulmonic Valve: The pulmonic valve was normal in structure. Pulmonic valve regurgitation is not visualized. No evidence of pulmonic stenosis. Aorta: Aortic dilatation noted. 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 Venous: The inferior vena cava is dilated in size with less than 50% respiratory variability, suggesting right atrial pressure of 15 mmHg. IAS/Shunts: No atrial level shunt detected by color flow Doppler.  LEFT VENTRICLE PLAX 2D LVIDd:         4.95 cm      Diastology LVIDs:         3.60 cm      LV e' medial:    9.68 cm/s LV PW:         0.80 cm      LV E/e' medial:  9.3 LV IVS:         1.00 cm      LV e' lateral:   13.80 cm/s LVOT diam:  2.30 cm      LV E/e' lateral: 6.5 LV SV:         174 LV SV Index:   74 LVOT Area:     4.15 cm  LV Volumes (MOD) LV vol d, MOD A2C: 209.0 ml LV vol d, MOD A4C: 185.0 ml LV vol s, MOD A2C: 57.1 ml LV vol s, MOD A4C: 63.6 ml LV SV MOD A2C:     151.9 ml LV SV MOD A4C:     185.0 ml LV SV MOD BP:      142.0 ml RIGHT VENTRICLE RV Basal diam:  4.10 cm RV S prime:     16.40 cm/s TAPSE (M-mode): 3.4 cm LEFT ATRIUM             Index        RIGHT ATRIUM           Index LA diam:        4.10 cm 1.76 cm/m   RA Area:     16.50 cm LA Vol (A2C):   66.5 ml 28.48 ml/m  RA Volume:   46.00 ml  19.70 ml/m LA Vol (A4C):   87.4 ml 37.43 ml/m LA Biplane Vol: 79.1 ml 33.88 ml/m  AORTIC VALVE                     PULMONIC VALVE AV Area (Vmax):    3.61 cm      PV Vmax:          1.42 m/s AV Area (Vmean):   2.94 cm      PV Peak grad:     8.1 mmHg AV Area (VTI):     3.91 cm      PR End Diast Vel: 2.65 msec AV Vmax:           207.00 cm/s AV Vmean:          129.500 cm/s AV VTI:            0.444 m AV Peak Grad:      17.1 mmHg AV Mean Grad:      7.5 mmHg LVOT Vmax:         180.00 cm/s LVOT Vmean:        91.500 cm/s LVOT VTI:          0.418 m LVOT/AV VTI ratio: 0.94 AI PHT:            692 msec  AORTA Ao Root diam: 4.00 cm Ao Asc diam:  4.00 cm MITRAL VALVE MV Area (PHT): 3.45 cm     SHUNTS MV Area VTI:   3.85 cm     Systemic VTI:  0.42 m MV Peak grad:  5.3 mmHg     Systemic Diam: 2.30 cm MV Mean grad:  2.0 mmHg MV Vmax:       1.15 m/s MV Vmean:      60.3 cm/s MV Decel Time: 220 msec MV E velocity: 89.70 cm/s MV A velocity: 108.00 cm/s MV E/A ratio:  0.83 Aditya Sabharwal Electronically signed by Ria Commander Signature Date/Time: 03/21/2024/11:41:42 AM    Final    DG Chest Port 1 View Result Date: 03/20/2024 CLINICAL DATA:  Cough and wheezing. EXAM: PORTABLE CHEST 1 VIEW COMPARISON:  05/30/2023 and CT chest 02/29/2024. FINDINGS: Trachea is midline. Heart size stable. Lungs are  emphysematous. Mild interstitial prominence and indistinctness diffusely. No pleural fluid. Right shoulder arthroplasty. IMPRESSION: Probable mild pulmonary edema. Electronically Signed   By: Newell  Blietz M.D.   On: 03/20/2024 12:20   CT CHEST LCS NODULE F/U LOW DOSE WO CONTRAST Result Date: 03/13/2024 CLINICAL DATA:  Abnormal lung cancer screening CT, lung rads 4A. Current 63 pack-year smoker. EXAM: CT CHEST WITHOUT CONTRAST FOR LUNG CANCER SCREENING NODULE FOLLOW-UP TECHNIQUE: Multidetector CT imaging of the chest was performed following the standard protocol without IV contrast. RADIATION DOSE REDUCTION: This exam was performed according to the departmental dose-optimization program which includes automated exposure control, adjustment of the mA and/or kV according to patient size and/or use of iterative reconstruction technique. COMPARISON:  11/28/2023. FINDINGS: Cardiovascular: Atherosclerotic calcification of the aorta with age advanced involvement of the left anterior descending and right coronary arteries. Ascending aorta measures 4.0 cm (coronal image 169), stable. Heart size normal. No pericardial effusion. Enlarged pulmonic trunk. Mediastinum/Nodes: Mediastinal lymph nodes are not enlarged by CT size criteria and are stable from 11/28/2023. Hilar regions are difficult to definitively evaluate without IV contrast. No axillary adenopathy. Esophagus is grossly unremarkable. Lungs/Pleura: Centrilobular and paraseptal emphysema. Biapical pleuroparenchymal scarring. Smoking related respiratory bronchiolitis. Spiculated anterior segment right upper lobe nodule has enlarged, now measuring 13.4 mm (4/91), increased in size from 10.5 mm on 11/28/2023 and largely new from 05/30/2023. 4.2 mm right middle lobe nodule, unchanged no pleural fluid. Minimal debris in the airway. Upper Abdomen: Visualized portions of the liver, adrenal glands, right kidney, spleen, pancreas, stomach and bowel are grossly  unremarkable. No upper abdominal adenopathy. Musculoskeletal: Degenerative changes in the spine. Right shoulder arthroplasty. IMPRESSION: 1. Enlarging spiculated right upper lobe nodule, highly worrisome for primary bronchogenic carcinoma. Lung-RADS 4X, highly suspicious. Additional imaging evaluation or consultation with Pulmonology or Thoracic Surgery recommended. These results will be called to the ordering clinician or representative by the Radiologist Assistant, and communication documented in the PACS or [employer]. 2.  Age advanced two vessel coronary artery calcification. 3. 4.0 cm ascending aortic aneurysm, stable. Recommend annual imaging followup by CTA or MRA. This recommendation follows 2010 ACCF/AHA/AATS/ACR/ASA/SCA/SCAI/SIR/STS/SVM Guidelines for the Diagnosis and Management of Patients with Thoracic Aortic Disease. Circulation. 2010; 121: Z733-z630. Aortic aneurysm NOS (ICD10-I71.9). 4.  Aortic atherosclerosis (ICD10-I70.0). 5. Enlarged pulmonic trunk, indicative of pulmonary arterial hypertension. 6.  Emphysema (ICD10-J43.9). Electronically Signed   By: Newell Eke M.D.   On: 03/13/2024 14:25   (Echo, Carotid, EGD, Colonoscopy, ERCP)    Subjective: No complaints  Discharge Exam: Vitals:   03/23/24 0724 03/23/24 0821  BP: 121/63   Pulse: (!) 50 (!) 52  Resp: 16 17  Temp: 98.4 F (36.9 C)   SpO2: 100% 99%   Vitals:   03/22/24 2331 03/23/24 0229 03/23/24 0724 03/23/24 0821  BP: 116/60 112/71 121/63   Pulse: (!) 53 (!) 54 (!) 50 (!) 52  Resp: 20 20 16 17   Temp: 97.6 F (36.4 C) 97.8 F (36.6 C) 98.4 F (36.9 C)   TempSrc: Oral Oral Oral   SpO2: 95% 98% 100% 99%  Weight:      Height:        General: Pt is alert, awake, not in acute distress Cardiovascular: RRR, S1/S2 +, no rubs, no gallops Respiratory: CTA bilaterally, no wheezing, no rhonchi Abdominal: Soft, NT, ND, bowel sounds + Extremities: no edema, no cyanosis    The results of significant  diagnostics from this hospitalization (including imaging, microbiology, ancillary and laboratory) are listed below for reference.     Microbiology: Recent Results (from the past 240 hours)  Resp panel by RT-PCR (RSV, Flu A&B, Covid) Anterior Nasal Swab  Status: None   Collection Time: 03/20/24  2:53 PM   Specimen: Anterior Nasal Swab  Result Value Ref Range Status   SARS Coronavirus 2 by RT PCR NEGATIVE NEGATIVE Final   Influenza A by PCR NEGATIVE NEGATIVE Final   Influenza B by PCR NEGATIVE NEGATIVE Final    Comment: (NOTE) The Xpert Xpress SARS-CoV-2/FLU/RSV plus assay is intended as an aid in the diagnosis of influenza from Nasopharyngeal swab specimens and should not be used as a sole basis for treatment. Nasal washings and aspirates are unacceptable for Xpert Xpress SARS-CoV-2/FLU/RSV testing.  Fact Sheet for Patients: BloggerCourse.com  Fact Sheet for Healthcare Providers: SeriousBroker.it  This test is not yet approved or cleared by the [place]  FDA and has been authorized for detection and/or diagnosis of SARS-CoV-2 by FDA under an Emergency Use Authorization (EUA). This EUA will remain in effect (meaning this test can be used) for the duration of the COVID-19 declaration under Section 564(b)(1) of the Act, 21 U.S.C. section 360bbb-3(b)(1), unless the authorization is terminated or revoked.     Resp Syncytial Virus by PCR NEGATIVE NEGATIVE Final    Comment: (NOTE) Fact Sheet for Patients: BloggerCourse.com  Fact Sheet for Healthcare Providers: SeriousBroker.it  This test is not yet approved or cleared by the [place]  FDA and has been authorized for detection and/or diagnosis of SARS-CoV-2 by FDA under an Emergency Use Authorization (EUA). This EUA will remain in effect (meaning this test can be used) for the duration of the COVID-19 declaration under  Section 564(b)(1) of the Act, 21 U.S.C. section 360bbb-3(b)(1), unless the authorization is terminated or revoked.  Performed at [facility], [address] [place], [place] [address]      Labs: BNP (last 3 results) Recent Labs    05/30/23 1059 03/20/24 1419  BNP 34.3 20.6   Basic Metabolic Panel: Recent Labs  Lab 03/20/24 1201 03/21/24 0333 03/22/24 0347  NA 132* 133* 137  K 4.4 4.1 3.4*  CL 102 102 103  CO2 24 23 24   GLUCOSE 95 178* 128*  BUN 13 13 14   CREATININE 0.96 1.12 0.93  CALCIUM  8.5* 8.6* 8.7*   Liver Function Tests: Recent Labs  Lab 03/20/24 1201 03/21/24 0333  AST 17 18  ALT 10 10  ALKPHOS 48 48  BILITOT 0.8 0.5  PROT 5.7* 5.9*  ALBUMIN  3.3* 3.0*   No results for input(s): LIPASE, AMYLASE in the last 168 hours. Recent Labs  Lab 03/20/24 1201  AMMONIA 54*   CBC: Recent Labs  Lab 03/20/24 1201 03/21/24 0333 03/22/24 0347  WBC 6.6 11.4* 11.0*  HGB 13.6 14.0 13.0  HCT 39.8 40.6 37.3*  MCV 96.8 95.3 94.2  PLT 141* 132* 131*   Cardiac Enzymes: No results for input(s): CKTOTAL, CKMB, CKMBINDEX, TROPONINI in the last 168 hours. BNP: Invalid input(s): POCBNP CBG: Recent Labs  Lab 03/21/24 1202 03/21/24 1550  GLUCAP 146* 169*   D-Dimer No results for input(s): DDIMER in the last 72 hours. Hgb A1c No results for input(s): HGBA1C in the last 72 hours. Lipid Profile No results for input(s): CHOL, HDL, LDLCALC, TRIG, CHOLHDL, LDLDIRECT in the last 72 hours. Thyroid  function studies Recent Labs    03/22/24 0347  TSH 0.289*   Anemia work up No results for input(s): VITAMINB12, FOLATE, FERRITIN, TIBC, IRON, RETICCTPCT in the last 72 hours. Urinalysis    Component Value Date/Time   COLORURINE YELLOW 08/24/2023 1327   APPEARANCEUR CLEAR 08/24/2023 1327   APPEARANCEUR Clear 05/18/2021 1048  LABSPEC 1.016 08/24/2023 1327   PHURINE 5.0 08/24/2023 1327   GLUCOSEU NEGATIVE 08/24/2023  1327   HGBUR SMALL (A) 08/24/2023 1327   BILIRUBINUR NEGATIVE 08/24/2023 1327   BILIRUBINUR Negative 05/18/2021 1048   KETONESUR NEGATIVE 08/24/2023 1327   PROTEINUR NEGATIVE 08/24/2023 1327   UROBILINOGEN 0.2 12/12/2019 0905   NITRITE NEGATIVE 08/24/2023 1327   LEUKOCYTESUR NEGATIVE 08/24/2023 1327   Sepsis Labs Recent Labs  Lab 03/20/24 1201 03/21/24 0333 03/22/24 0347  WBC 6.6 11.4* 11.0*   Microbiology Recent Results (from the past 240 hours)  Resp panel by RT-PCR (RSV, Flu A&B, Covid) Anterior Nasal Swab     Status: None   Collection Time: 03/20/24  2:53 PM   Specimen: Anterior Nasal Swab  Result Value Ref Range Status   SARS Coronavirus 2 by RT PCR NEGATIVE NEGATIVE Final   Influenza A by PCR NEGATIVE NEGATIVE Final   Influenza B by PCR NEGATIVE NEGATIVE Final    Comment: (NOTE) The Xpert Xpress SARS-CoV-2/FLU/RSV plus assay is intended as an aid in the diagnosis of influenza from Nasopharyngeal swab specimens and should not be used as a sole basis for treatment. Nasal washings and aspirates are unacceptable for Xpert Xpress SARS-CoV-2/FLU/RSV testing.  Fact Sheet for Patients: BloggerCourse.com  Fact Sheet for Healthcare Providers: SeriousBroker.it  This test is not yet approved or cleared by the [place]  FDA and has been authorized for detection and/or diagnosis of SARS-CoV-2 by FDA under an Emergency Use Authorization (EUA). This EUA will remain in effect (meaning this test can be used) for the duration of the COVID-19 declaration under Section 564(b)(1) of the Act, 21 U.S.C. section 360bbb-3(b)(1), unless the authorization is terminated or revoked.     Resp Syncytial Virus by PCR NEGATIVE NEGATIVE Final    Comment: (NOTE) Fact Sheet for Patients: BloggerCourse.com  Fact Sheet for Healthcare Providers: SeriousBroker.it  This test is not yet approved  or cleared by the [place]  FDA and has been authorized for detection and/or diagnosis of SARS-CoV-2 by FDA under an Emergency Use Authorization (EUA). This EUA will remain in effect (meaning this test can be used) for the duration of the COVID-19 declaration under Section 564(b)(1) of the Act, 21 U.S.C. section 360bbb-3(b)(1), unless the authorization is terminated or revoked.  Performed at [facility], [address] [place], [place] [address]      Time coordinating discharge: Over 35 minutes  SIGNED:   Erle Odell Castor, MD  [facility] 03/23/2024, 8:37 AM Pager   If 7PM-7AM, please contact night-coverage [email or web address] Password [email or web address]

## 2024-03-24 ENCOUNTER — Other Ambulatory Visit: Payer: Self-pay | Admitting: Surgical

## 2024-03-26 ENCOUNTER — Ambulatory Visit (INDEPENDENT_AMBULATORY_CARE_PROVIDER_SITE_OTHER): Payer: MEDICAID | Admitting: Student in an Organized Health Care Education/Training Program

## 2024-03-26 ENCOUNTER — Encounter: Payer: Self-pay | Admitting: Student in an Organized Health Care Education/Training Program

## 2024-03-26 ENCOUNTER — Telehealth: Payer: Self-pay | Admitting: *Deleted

## 2024-03-26 VITALS — BP 108/58 | HR 53 | Temp 97.5°F | Ht 75.0 in | Wt 241.0 lb

## 2024-03-26 DIAGNOSIS — F17200 Nicotine dependence, unspecified, uncomplicated: Secondary | ICD-10-CM

## 2024-03-26 DIAGNOSIS — F17210 Nicotine dependence, cigarettes, uncomplicated: Secondary | ICD-10-CM

## 2024-03-26 DIAGNOSIS — R911 Solitary pulmonary nodule: Secondary | ICD-10-CM | POA: Diagnosis not present

## 2024-03-26 DIAGNOSIS — J42 Unspecified chronic bronchitis: Secondary | ICD-10-CM

## 2024-03-26 MED ORDER — NICOTINE 21 MG/24HR TD PT24: 21.0000 mg | MEDICATED_PATCH | TRANSDERMAL | 0 refills | Status: AC

## 2024-03-26 MED ORDER — NICOTINE POLACRILEX 2 MG MT LOZG: 2.0000 mg | LOZENGE | OROMUCOSAL | 3 refills | Status: DC | PRN

## 2024-03-26 MED ORDER — NICOTINE 14 MG/24HR TD PT24: 14.0000 mg | MEDICATED_PATCH | TRANSDERMAL | 0 refills | Status: AC

## 2024-03-26 MED ORDER — NICOTINE 7 MG/24HR TD PT24: 7.0000 mg | MEDICATED_PATCH | TRANSDERMAL | 0 refills | Status: AC

## 2024-03-26 NOTE — Progress Notes (Unsigned)
 Synopsis: Referred in *** by Lorren Greig PARAS, NP  Assessment & Plan:   1. Lung nodule  Nodule present previously, very peripherally. PET/CT, if positive will send for surgery.  - Pulmonary Function Test; Future - NM PET Image Initial (PI) Skull Base To Thigh (F-18 FDG); Future  2. Chronic bronchitis, unspecified chronic bronchitis type (HCC) *** - Pulmonary Function Test; Future  3. Tobacco use disorder (Primary) *** - nicotine  (NICODERM CQ  - DOSED IN MG/24 HOURS) 21 mg/24hr patch; Place 1 patch (21 mg total) onto the skin daily.  Dispense: 42 patch; Refill: 0 - nicotine  (NICODERM CQ  - DOSED IN MG/24 HOURS) 14 mg/24hr patch; Place 1 patch (14 mg total) onto the skin daily for 14 days.  Dispense: 14 patch; Refill: 0 - nicotine  (NICODERM CQ  - DOSED IN MG/24 HR) 7 mg/24hr patch; Place 1 patch (7 mg total) onto the skin daily for 14 days.  Dispense: 14 patch; Refill: 0 - nicotine  polacrilex (NICOTINE  MINI) 2 MG lozenge; Take 1 lozenge (2 mg total) by mouth every 2 (two) hours as needed for smoking cessation.  Dispense: 72 lozenge; Refill: 3   Return in about 2 weeks (around 04/09/2024).  I spent *** minutes caring for this patient today, including {EM billing:28027}  Belva November, MD  Pulmonary Critical Care 03/26/2024 11:17 AM    End of visit medications:  Meds ordered this encounter  Medications   nicotine  (NICODERM CQ  - DOSED IN MG/24 HOURS) 21 mg/24hr patch    Sig: Place 1 patch (21 mg total) onto the skin daily.    Dispense:  42 patch    Refill:  0   nicotine  (NICODERM CQ  - DOSED IN MG/24 HOURS) 14 mg/24hr patch    Sig: Place 1 patch (14 mg total) onto the skin daily for 14 days.    Dispense:  14 patch    Refill:  0   nicotine  (NICODERM CQ  - DOSED IN MG/24 HR) 7 mg/24hr patch    Sig: Place 1 patch (7 mg total) onto the skin daily for 14 days.    Dispense:  14 patch    Refill:  0   nicotine  polacrilex (NICOTINE  MINI) 2 MG lozenge    Sig: Take 1 lozenge (2 mg  total) by mouth every 2 (two) hours as needed for smoking cessation.    Dispense:  72 lozenge    Refill:  3     Current Outpatient Medications:    acetaminophen  (TYLENOL ) 325 MG tablet, Take 1 tablet (325 mg total) by mouth every 6 (six) hours as needed for mild pain (pain score 1-3) (or temp > 100.5)., Disp: 60 tablet, Rfl: 0   albuterol  (VENTOLIN  HFA) 108 (90 Base) MCG/ACT inhaler, Inhale 2 puffs into the lungs every 6 (six) hours as needed for wheezing or shortness of breath., Disp: 18 g, Rfl: 2   aspirin  EC 81 MG tablet, Take 1 tablet (81 mg total) by mouth daily. Swallow whole. (Patient taking differently: Take 81 mg by mouth in the morning. Swallow whole.), Disp: 30 tablet, Rfl: 0   atorvastatin  (LIPITOR ) 80 MG tablet, TAKE 1 TABLET BY MOUTH EVERY DAY (Patient taking differently: Take 80 mg by mouth in the morning.), Disp: 90 tablet, Rfl: 3   budesonide -glycopyrrolate -formoterol  (BREZTRI  AEROSPHERE) 160-9-4.8 MCG/ACT AERO inhaler, Inhale 2 puffs into the lungs in the morning and at bedtime., Disp: 1 each, Rfl: 2   celecoxib  (CELEBREX ) 100 MG capsule, TAKE 2 CAPSULES BY MOUTH 2 TIMES DAILY., Disp: 60 capsule, Rfl: 1  divalproex  (DEPAKOTE ) 500 MG DR tablet, Take half tablet in the morning and 3 tablets at bedtime, Disp: 105 tablet, Rfl: 2   doxycycline  (VIBRA -TABS) 100 MG tablet, Take 1 tablet (100 mg total) by mouth every 12 (twelve) hours for 5 days., Disp: 10 tablet, Rfl: 0   DULoxetine  (CYMBALTA ) 30 MG capsule, Take 3 capsules (90 mg total) by mouth daily. (Patient taking differently: Take 30-60 mg by mouth See admin instructions. 30mg  qam, 60mg  qhs), Disp: 270 capsule, Rfl: 0   gabapentin  (NEURONTIN ) 600 MG tablet, Take 1 tablet (600 mg total) by mouth daily AND 2 tablets (1,200 mg total) at bedtime., Disp: 90 tablet, Rfl: 1   hydrOXYzine  (ATARAX ) 25 MG tablet, Take 1 tablet (25 mg total) by mouth 2 (two) times daily as needed., Disp: 270 tablet, Rfl: 0   isosorbide  mononitrate (IMDUR ) 30  MG 24 hr tablet, Take 1 tablet (30 mg total) by mouth 2 (two) times daily., Disp: 30 tablet, Rfl: 1   Multiple Vitamins-Minerals (MENS 50+ MULTIVITAMIN) TABS, Take 1 tablet by mouth in the morning., Disp: , Rfl:    nicotine  (NICODERM CQ  - DOSED IN MG/24 HOURS) 14 mg/24hr patch, Place 1 patch (14 mg total) onto the skin daily for 14 days., Disp: 14 patch, Rfl: 0   nicotine  (NICODERM CQ  - DOSED IN MG/24 HOURS) 21 mg/24hr patch, Place 1 patch (21 mg total) onto the skin daily., Disp: 42 patch, Rfl: 0   nicotine  (NICODERM CQ  - DOSED IN MG/24 HR) 7 mg/24hr patch, Place 1 patch (7 mg total) onto the skin daily for 14 days., Disp: 14 patch, Rfl: 0   nicotine  polacrilex (NICOTINE  MINI) 2 MG lozenge, Take 1 lozenge (2 mg total) by mouth every 2 (two) hours as needed for smoking cessation., Disp: 72 lozenge, Rfl: 3   nitroGLYCERIN  (NITROSTAT ) 0.4 MG SL tablet, Place 1 tablet (0.4 mg total) under the tongue every 5 (five) minutes as needed for chest pain., Disp: 30 tablet, Rfl: 0   pantoprazole  (PROTONIX ) 40 MG tablet, Take 1 tablet (40 mg total) by mouth daily. (Patient taking differently: Take 40 mg by mouth in the morning.), Disp: 90 tablet, Rfl: 0   predniSONE  (DELTASONE ) 10 MG tablet, Takes 4 tablets by mouth for 1 day, then 3 tablets for 1 day, then 2 tablets for 1 day, then 1 tablet for 1 day, and then stop., Disp: 10 tablet, Rfl: 0   QUEtiapine  (SEROQUEL ) 200 MG tablet, Take 1.5 tablets (300 mg total) by mouth at bedtime., Disp: 90 tablet, Rfl: 0   traZODone  (DESYREL ) 100 MG tablet, Take 1 tablet (100 mg total) by mouth at bedtime., Disp: 90 tablet, Rfl: 0   Subjective:   PATIENT ID: Keith Gibbs GENDER: male DOB: 01-07-1961, MRN: [id number]  Chief Complaint  Patient presents with   Lung Mass    No SOB. Wheezing. Cough with yellow/dark brown sputum.    HPI ***  Ancillary information including prior medications, full medical/surgical/family/social histories, and PFTs (when available) are  listed below and have been reviewed.   {PULM QUESTIONNAIRES (Optional):33196}  ROS   Objective:   Vitals:   03/26/24 1036  BP: (!) 108/58  Pulse: (!) 53  Temp: (!) 97.5 F (36.4 C)  SpO2: 95%  Weight: 241 lb (109.3 kg)  Height: 6' 3 (1.905 m)   95% on *** LPM *** RA BMI Readings from Last 3 Encounters:  03/26/24 30.12 kg/m  03/20/24 28.93 kg/m  03/18/24 29.12 kg/m   Wt Readings from Last  3 Encounters:  03/26/24 241 lb (109.3 kg)  03/20/24 231 lb 7.7 oz (105 kg)  03/18/24 233 lb (105.7 kg)    Physical Exam    Ancillary Information    Past Medical History:  Diagnosis Date   Anginal pain (HCC)    Anxiety    Arthritis    Bipolar 1 disorder (HCC)    Bursitis    CAD (coronary artery disease)    Chronic pain    COPD (chronic obstructive pulmonary disease) (HCC)    Current use of long term anticoagulation    DAPT (ASA + clopidogrel )   Depression    Diverticulitis    Dyspnea    GERD (gastroesophageal reflux disease)    Grade I diastolic dysfunction    Hepatitis C 2012   No longer has Hep C   HLD (hyperlipidemia)    Hypertension    MI (myocardial infarction) (HCC)    Polysubstance abuse (HCC)    cocaine, marijuana, ETOH   PUD (peptic ulcer disease)    S/P angioplasty with stent 06/10/2016   a.) 90% stenosis of pLAD to mLAD - 2.5 x 18 mm Xience Alpine (DES x 1) placed to pLAD   S/P PTCA (percutaneous transluminal coronary angioplasty) 12/04/2019   a.) 60% in stent restenosis of DES to pLAD; LVEF 65%.   Schizophrenia (HCC)    Stroke [facility])    Valvular insufficiency    a.) Mild MR, TR, PR; mild to moderate AR on 03/05/2018 TTE     Family History  Problem Relation Age of Onset   Osteoarthritis Mother    Heart disease Mother    Hypertension Mother    Depression Mother    Heart disease Father    Early death Father    Hypertension Father    Heart attack Father    Hypertension Sister    Parkinson's disease Maternal Grandfather    Prostate cancer  Neg Hx    Bladder Cancer Neg Hx    Kidney cancer Neg Hx    Tremor Neg Hx      Past Surgical History:  Procedure Laterality Date   ABDOMINAL SURGERY     removed small piece of intestines due to [facility] Diverticulosis   ANTERIOR LAT LUMBAR FUSION N/A 08/23/2022   Procedure: DIRECT LATERAL INTERBODY FUSION  LUMBAR TWO- LUMBAR THREE, LUMBAR THREE-LUMBAR FOUR , EXPLORE AND EXTEND FUSION LUMBAR TWO-LUMBAR FIVE, POSTERIOR DECOMPRESSION LUMBAR THREE-LUMBAR FOUR, LEFT LUMBAR TWO-LUMBAR THREE;  Surgeon: Debby Dorn MATSU, MD;  Location: [facility] OR;  Service: Neurosurgery;  Laterality: N/A;   APPENDECTOMY     BACK SURGERY     CARDIAC CATHETERIZATION Left 06/10/2016   Procedure: Left Heart Cath and Coronary Angiography;  Surgeon: Denyse DELENA Bathe, MD;  Location: [facility] INVASIVE CV LAB;  Service: Cardiovascular;  Laterality: Left;   CARDIAC CATHETERIZATION N/A 06/10/2016   Procedure: Coronary Stent Intervention;  Surgeon: Cara JONETTA Lovelace, MD;  Location: [facility] INVASIVE CV LAB;  Service: Cardiovascular;  Laterality: N/A;   CHOLECYSTECTOMY N/A 02/17/2022   Procedure: LAPAROSCOPIC CHOLECYSTECTOMY;  Surgeon: Stevie Herlene Righter, MD;  Location: [facility] OR;  Service: General;  Laterality: N/A;   COLON SURGERY     COLONOSCOPY     COLONOSCOPY WITH PROPOFOL  N/A 01/05/2017   Procedure: COLONOSCOPY WITH PROPOFOL ;  Surgeon: Therisa Bi, MD;  Location: [facility] ENDOSCOPY;  Service: Endoscopy;  Laterality: N/A;   COLONOSCOPY WITH PROPOFOL  N/A 02/13/2020   Procedure: COLONOSCOPY WITH PROPOFOL ;  Surgeon: Janalyn Keene NOVAK, MD;  Location: [facility] ENDOSCOPY;  Service: Endoscopy;  Laterality: N/A;   CORONARY ANGIOPLASTY WITH STENT PLACEMENT     CORONARY PRESSURE/FFR STUDY N/A 12/04/2019   Procedure: INTRAVASCULAR PRESSURE WIRE/FFR STUDY;  Surgeon: Wonda Sharper, MD;  Location[facility] INVASIVE CV LAB;  Service: Cardiovascular;  Laterality: N/A;   ESOPHAGOGASTRODUODENOSCOPY (EGD) WITH PROPOFOL  N/A 01/05/2017   Procedure:  ESOPHAGOGASTRODUODENOSCOPY (EGD) WITH PROPOFOL ;  Surgeon: Therisa Bi, MD;  Location: [facility] ENDOSCOPY;  Service: Endoscopy;  Laterality: N/A;   ESOPHAGOGASTRODUODENOSCOPY (EGD) WITH PROPOFOL  N/A 02/13/2020   Procedure: ESOPHAGOGASTRODUODENOSCOPY (EGD) WITH PROPOFOL ;  Surgeon: Janalyn Keene NOVAK, MD;  Location: [facility] ENDOSCOPY;  Service: Endoscopy;  Laterality: N/A;   KNEE ARTHROSCOPY WITH MEDIAL MENISECTOMY Right 09/05/2017   Procedure: KNEE ARTHROSCOPY WITH MEDIAL AND LATERAL  MENISECTOMY PARTIAL SYNOVECTOMY;  Surgeon: Kathlynn Sharper, MD;  Location: [facility] ORS;  Service: Orthopedics;  Laterality: Right;   LEFT HEART CATH AND CORONARY ANGIOGRAPHY N/A 12/04/2019   Procedure: LEFT HEART CATH AND CORONARY ANGIOGRAPHY;  Surgeon: Wonda Sharper, MD;  Location: [facility] INVASIVE CV LAB;  Service: Cardiovascular;  Laterality: N/A;   LEFT HEART CATH AND CORONARY ANGIOGRAPHY Left 11/25/2022   Procedure: LEFT HEART CATH AND CORONARY ANGIOGRAPHY;  Surgeon: Fernand Denyse LABOR, MD;  Location: [facility] INVASIVE CV LAB;  Service: Cardiovascular;  Laterality: Left;   REVERSE SHOULDER ARTHROPLASTY Right 08/31/2023   Procedure: RIGHT REVERSE SHOULDER ARTHROPLASTY;  Surgeon: [name], MD;  Location: [facility] OR;  Service: Orthopedics;  Laterality: Right;   SHOULDER SURGERY Right 04/09/2012   SPINE SURGERY     TOTAL KNEE ARTHROPLASTY Right 01/19/2021   Procedure: TOTAL KNEE ARTHROPLASTY - [place] Amber to Assist;  Surgeon: Kathlynn Sharper, MD;  Location: [facility] ORS;  Service: Orthopedics;  Laterality: Right;    Social History   Socioeconomic History   Marital status: Widowed    Spouse name: Not on file   Number of children: Not on file   Years of education: Not on file   Highest education level: GED or equivalent  Occupational History   Occupation: disability   Occupation: stocking at gas station    Comment: 2 days per week  Tobacco Use   Smoking status: Every Day    Current packs/day: 1.50    Average packs/day: 1.5  packs/day for 42.4 years (63.6 ttl pk-yrs)    Types: Cigarettes    Start date: 10/26/1978    Last attempt to quit: 2000    Passive exposure: Current   Smokeless tobacco: Never   Tobacco comments:    Smokes 0.5 PPD - khj 03/26/2024        Started smoking at 63 yrs old    Smoked 1.5 PPD at his heaviest  Vaping Use   Vaping status: Never Used  Substance and Sexual Activity   Alcohol  use: Not Currently    Alcohol /week: 3.0 standard drinks of alcohol     Types: 3 Cans of beer per week    Comment: occassionally    Drug use: Not Currently    Types: Cocaine, Marijuana    Comment: last on saturday   Sexual activity: Not Currently    Partners: Female    Birth control/protection: None  Other Topics Concern   Not on file  Social History Narrative   Not on file   Social Drivers of Health   Financial Resource Strain: High Risk (09/19/2023)   Overall Financial Resource Strain (CARDIA)    Difficulty of Paying Living Expenses: Hard  Food Insecurity: Food Insecurity Present (03/23/2024)   Hunger Vital Sign    Worried About Running Out of  Food in the Last Year: Sometimes true    Ran Out of Food in the Last Year: Sometimes true  Transportation Needs: No Transportation Needs (03/23/2024)   PRAPARE - Administrator, Civil Service (Medical): No    Lack of Transportation (Non-Medical): No  Physical Activity: Unknown (09/19/2023)   Exercise Vital Sign    Days of Exercise per Week: 0 days    Minutes of Exercise per Session: Not on file  Stress: Stress Concern Present (09/19/2023)   [employer] of Occupational Health - Occupational Stress Questionnaire    Feeling of Stress : Rather much  Social Connections: Moderately Isolated (09/19/2023)   Social Connection and Isolation Panel    Frequency of Communication with Friends and Family: More than three times a week    Frequency of Social Gatherings with Friends and Family: More than three times a week    Attends Religious Services: More  than 4 times per year    Active Member of [employer] or Organizations: No    Attends Banker Meetings: Not on file    Marital Status: Widowed  Intimate Partner Violence: Unknown (03/23/2024)   Humiliation, Afraid, Rape, and Kick questionnaire    Fear of Current or Ex-Partner: No    Emotionally Abused: No    Physically Abused: Not on file    Sexually Abused: No     Allergies  Allergen Reactions   Asenapine Other (See Comments) and Nausea And Vomiting    Increased tremors   Dextromethorphan Hbr    Guaifenesin     Latuda [Lurasidone Hcl] Other (See Comments)    Tremors     Lurasidone Other (See Comments) and Hives    Other Reaction(s): Angioedema   Phenylephrine       CBC    Component Value Date/Time   WBC 11.0 (H) 03/22/2024 0347   RBC 3.96 (L) 03/22/2024 0347   HGB 13.0 03/22/2024 0347   HGB 14.1 02/13/2023 1031   HCT 37.3 (L) 03/22/2024 0347   HCT 43.2 02/13/2023 1031   PLT 131 (L) 03/22/2024 0347   PLT 157 02/13/2023 1031   MCV 94.2 03/22/2024 0347   MCV 98 (H) 02/13/2023 1031   MCH 32.8 03/22/2024 0347   MCHC 34.9 03/22/2024 0347   RDW 13.7 03/22/2024 0347   RDW 13.7 02/13/2023 1031   LYMPHSABS 2.0 02/13/2023 1031   MONOABS 0.5 08/03/2022 0950   EOSABS 0.3 02/13/2023 1031   BASOSABS 0.0 02/13/2023 1031    Pulmonary Functions Testing Results:     No data to display          Outpatient Medications Prior to Visit  Medication Sig Dispense Refill   acetaminophen  (TYLENOL ) 325 MG tablet Take 1 tablet (325 mg total) by mouth every 6 (six) hours as needed for mild pain (pain score 1-3) (or temp > 100.5). 60 tablet 0   albuterol  (VENTOLIN  HFA) 108 (90 Base) MCG/ACT inhaler Inhale 2 puffs into the lungs every 6 (six) hours as needed for wheezing or shortness of breath. 18 g 2   aspirin  EC 81 MG tablet Take 1 tablet (81 mg total) by mouth daily. Swallow whole. (Patient taking differently: Take 81 mg by mouth in the morning. Swallow whole.) 30 tablet 0    atorvastatin  (LIPITOR ) 80 MG tablet TAKE 1 TABLET BY MOUTH EVERY DAY (Patient taking differently: Take 80 mg by mouth in the morning.) 90 tablet 3   budesonide -glycopyrrolate -formoterol  (BREZTRI  AEROSPHERE) 160-9-4.8 MCG/ACT AERO inhaler Inhale 2 puffs into the lungs in  the morning and at bedtime. 1 each 2   celecoxib  (CELEBREX ) 100 MG capsule TAKE 2 CAPSULES BY MOUTH 2 TIMES DAILY. 60 capsule 1   divalproex  (DEPAKOTE ) 500 MG DR tablet Take half tablet in the morning and 3 tablets at bedtime 105 tablet 2   doxycycline  (VIBRA -TABS) 100 MG tablet Take 1 tablet (100 mg total) by mouth every 12 (twelve) hours for 5 days. 10 tablet 0   DULoxetine  (CYMBALTA ) 30 MG capsule Take 3 capsules (90 mg total) by mouth daily. (Patient taking differently: Take 30-60 mg by mouth See admin instructions. 30mg  qam, 60mg  qhs) 270 capsule 0   gabapentin  (NEURONTIN ) 600 MG tablet Take 1 tablet (600 mg total) by mouth daily AND 2 tablets (1,200 mg total) at bedtime. 90 tablet 1   hydrOXYzine  (ATARAX ) 25 MG tablet Take 1 tablet (25 mg total) by mouth 2 (two) times daily as needed. 270 tablet 0   isosorbide  mononitrate (IMDUR ) 30 MG 24 hr tablet Take 1 tablet (30 mg total) by mouth 2 (two) times daily. 30 tablet 1   Multiple Vitamins-Minerals (MENS 50+ MULTIVITAMIN) TABS Take 1 tablet by mouth in the morning.     nitroGLYCERIN  (NITROSTAT ) 0.4 MG SL tablet Place 1 tablet (0.4 mg total) under the tongue every 5 (five) minutes as needed for chest pain. 30 tablet 0   pantoprazole  (PROTONIX ) 40 MG tablet Take 1 tablet (40 mg total) by mouth daily. (Patient taking differently: Take 40 mg by mouth in the morning.) 90 tablet 0   predniSONE  (DELTASONE ) 10 MG tablet Takes 4 tablets by mouth for 1 day, then 3 tablets for 1 day, then 2 tablets for 1 day, then 1 tablet for 1 day, and then stop. 10 tablet 0   QUEtiapine  (SEROQUEL ) 200 MG tablet Take 1.5 tablets (300 mg total) by mouth at bedtime. 90 tablet 0   traZODone  (DESYREL ) 100 MG  tablet Take 1 tablet (100 mg total) by mouth at bedtime. 90 tablet 0   No facility-administered medications prior to visit.

## 2024-03-26 NOTE — Patient Instructions (Signed)
 The [place]  Quitline: Call 1-800-QUIT-NOW (682 840 6469). The [place] Quitline is a free service for [place]  residents. Trained counselors are available from 8 am until 3 am, 365 days per year. Services are available in both [place] and [place].   Web Resources Free online support programs can help you track your progress and share experiences with others who are quitting. These are examples: www.becomeanex.org www.trytostop.org  www.smokefree.gov  www.https://www.vargas.com/.aspx  [facility]: offers comprehensive in-person tobacco treatment counseling at [facility] building ([address], [place] [address]).  Open to everyone. Virtual appointments available. Free parking. Call [phone] to schedule an appointment or [phone] for general information.    Tobacco Cessation Medications  Nicotine  Replacement Therapy (NRT)  Nicotine  is the addictive part of tobacco smoke, but not the most dangerous part. There are 7000 other toxins in cigarettes, including carbon monoxide, that cause disease. People do not generally become addicted to medication. Common problems: People don't use enough medication or stop too early. Medications are safe and effective. Overdose is very uncommon. Use medications as long as needed (3 months minimum). Some combinations work better than single medications. Long acting medications like the NRT patch and bupropion provide continuous treatment for withdrawal symptoms.  PLUS  Short acting medications like the NRT gum, lozenge, inhaler, and nasal spray help people to cope with breakthrough cravings.  ? Nicotine  Patch  Place patch on hairless skin on upper body, including arms and back. Each day: discard old patch, shower, apply new patch to a different site. Apply hydrocortisone cream to mildly red/irritated areas. Call provider if rash develops. If patch causes sleep disturbance, remove patch  at bedtime and replace each morning after shower. Side effects may include: skin irritation, headache, insomnia, abnormal/vivid dreams.  ? Nicotine  Gum  Chew gum slowly, park in cheek when peppery taste or tingling sensation begins (about 15-30 chews). When taste or tingling goes away, begin chewing again. Use until nicotine  is gone (taste or tingle does not return, usually 30 minutes). Park in different areas of mouth. Nicotine  is absorbed through the lining of the mouth. Use enough to control cravings, up to 24 pieces per day (if used alone). Avoid eating or drinking for 15 minutes before using and during use. Side effects may include: mouth/jaw soreness, hiccups, indigestion, hypersalivation.  If gum is not chewed correctly, additional side effects may include lightheadedness, nausea/vomiting, throat and mouth irritation.  ? Nicotine  Lozenge  Allow to dissolve slowly in mouth (20-30 minutes). Do not chew or swallow. Nicotine  release may cause a warm tingling sensation. Occasionally rotate to different areas of the mouth. Use enough to control cravings, up to 20 lozenges per day (if used alone). Avoid eating or drinking for 15 minutes before using and during use. Side effects may include: nausea, hiccups, cough, heartburn, headache, gas, insomnia.  ? Nicotine  Nasal Spray Use 1 spray in each nostril (1 dose) and tilt head back for 1 minute. Do not sniff, swallow, or inhale through nose.  Use at least 8 doses (1 spray in each nostril) , up to 40 doses per day (if used alone). To reduce nasal irritation, spray on cotton swab and insert into nose. Side effects may include: nasal and/or throat irritation (hot, peppery, or burning sensation), nasal irritation, tearing, sneezing, cough, headache.  ? Nicotine  Oral Inhaler (puffer) Inhale into the back of the throat or puff in short breaths. Do not inhale into the lungs.  Puff continuously for 20 minutes (about 80 puffs) until cartridge  is  empty. Change cartridge when it loses the "burning in throat" sensation (feels like air only). Open cartridges can be saved and used again within 24 hours. Use at least 6 and up to 16 cartridges per day (if used alone).  Avoid eating or drinking for 15 minutes before using and during use. Side effects may include: mouth and/or throat irritation, unpleasant taste, cough, nasal irritation, indigestion, hiccups, headache.  ? Chantix  (varenicline ) Days 1-3: Take one 0.5 mg white pill each morning for 3 days, one week before quit date. Days 4-7: Increase to one 0.5 mg white pill twice a day in morning and evening for 4 days.  On Day 8 (target quit date), increase to one 1 mg blue pill twice a day. Maintain this dose for a minimum of 3 months. Take with food and a full glass of water to reduce nausea. Be sure that the two doses are at least 8 hours apart, but try to take second dose early in the evening (i.e. 6 pm) to avoid sleep problems. Common side effects include: nausea, insomnia, headache, abnormal/vivid dreams. Tell your doctor if you have any history of psychiatric illness prior to starting Chantix .  STOP taking CHANTIX  and contact a healthcare provider immediately if you experience agitation, hostility, depressed mood, changes in thoughts or behavior that are not typical for you, thinking about or attempting suicide, allergic or skin reactions including swelling, rash, redness, or peeling of the skin.  For patients who have heart disease: Smoking is a major risk factor for cardiovascular disease, and Chantix  can help you quit smoking. Chantix  may be associated with a small, increased risk of certain heart events in patients who have heart disease. If you have any new or worsening symptoms of heart disease while taking Chantix , such as shortness of breath or trouble breathing, new or worsening chest pain, or new or worsening pain in your legs when walking, call your doctor or get emergency medical  help immediately.  ? Wellbutrin / Zyban (bupropion) Take one 150 mg pill each morning for 3 days, one week before target quit date. On Day 4, increase to one 150 mg pill twice a day, morning and evening.  Maintain this dose for a minimum of 3 months. Be sure that the two doses are at least 8 hours apart, but try to take second dose early in the evening (i.e. 6 pm) to avoid sleep problems. Avoid or minimize use of alcohol when taking this medication. Common side effects include: dry mouth, headache, insomnia, nausea, weight loss.  Risk of seizure is 07/998. STOP taking BUPROPION and contact a healthcare provider immediately if you experience agitation, hostility, depressed mood, changes in thoughts or behavior that are not typical for you, thinking about or attempting suicide, allergic or skin reactions including swelling, rash, redness, or peeling of the skin.

## 2024-03-26 NOTE — Transitions of Care (Post Inpatient/ED Visit) (Signed)
   03/26/2024  Name: Keith Gibbs MRN: [id number] DOB: [birth year]  Today's TOC FU Call Status: Today's TOC FU Call Status:: Unsuccessful Call (1st Attempt) Unsuccessful Call (1st Attempt) Date: 03/26/24  Attempted to reach the patient regarding the most recent Inpatient/ED visit.  Follow Up Plan: Additional outreach attempts will be made to reach the patient to complete the Transitions of Care (Post Inpatient/ED visit) call.   Andrea Dimes RN, BSN [place]  [facility] RN Care Manager [phone]

## 2024-03-27 ENCOUNTER — Telehealth: Payer: Self-pay

## 2024-03-27 ENCOUNTER — Ambulatory Visit (HOSPITAL_COMMUNITY): Payer: MEDICAID

## 2024-03-27 ENCOUNTER — Ambulatory Visit (INDEPENDENT_AMBULATORY_CARE_PROVIDER_SITE_OTHER): Payer: MEDICAID | Admitting: Licensed Clinical Social Worker

## 2024-03-27 DIAGNOSIS — F1420 Cocaine dependence, uncomplicated: Secondary | ICD-10-CM

## 2024-03-27 DIAGNOSIS — F17210 Nicotine dependence, cigarettes, uncomplicated: Secondary | ICD-10-CM

## 2024-03-27 DIAGNOSIS — F1290 Cannabis use, unspecified, uncomplicated: Secondary | ICD-10-CM

## 2024-03-27 DIAGNOSIS — F250 Schizoaffective disorder, bipolar type: Secondary | ICD-10-CM

## 2024-03-27 DIAGNOSIS — F4310 Post-traumatic stress disorder, unspecified: Secondary | ICD-10-CM

## 2024-03-27 NOTE — Progress Notes (Signed)
 Daily Group Progress Note   Program: [facility] IOP     Group Time: 9 a.m. to 12 p.m.      Type of Therapy: Process and Psychoeducational    Topic: The therapists check in with group members, assess for SI/HI/psychosis and overall level of functioning. The therapists inquire about sobriety date and number of community support meetings attended since last session.   The therapists introduce a new group member and challenge group members to answer questions concerning the one major thing which would differentiate an alcoholic from a social drinker and the reason that willpower is ineffective in relation to being able to drink or use in a controlled fashion. The therapists talk about what is meant by a reservation and suggest that many people's reservation involves the death of someone important in their lives. The therapists suggest that group members can talk with people in long-term recovery and find out how they dealt with death and dying without using substances. The therapists talk about the fact that active addiction is a barrier to having a healthy relationships with others.      Summary: Keith Gibbs presents today rating his depression and anxiety both as a 5.  Keith Gibbs says that he quit smoking cigarettes and is now wearing a nicotine  patch receiving praise from both therapists or having taken this positive step towards better health.  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  Keith Gibbs says that he has a lot of issues right now regardless feels blessed.  He shares his story of how he came to be in the intensive outpatient program with the new group member.  Keith Gibbs says that he previously resolved that he would never see the jail again; however, after he relapsed, ended up in jail once more.  He says that he was  doing well until the death of his wife and daughter.  He concludes that he is starting to feel better about himself and is proud that he is doing recovery this time for himself and no one else.  He says that he did not think that his addiction as being a fatal disease until now.  In responding to a group members recent slip, he says that his sponsor told him that in such situations that your first thoughts are usually wrong.  Keith Gibbs details the changes that he has made thus far which include getting rid of his scooter, still eating that connections to drug dealers and his phone, and attending meetings.  He says that a guy at a meeting told Keith Gibbs that the only thing that he needs to change is everything.  Progress Towards Goals: Keith Gibbs reports no change in his sobriety date.   UDS collected: No Results: Yes, negative for drugs and alcohol  except for nicotine    AA/NA attended?:  Yes   Sponsor?:  Yes     Elsie Maier, MA, LCSW, [facility], LCAS Darice Simpler, LMFT, LCAS 03/27/2024

## 2024-03-27 NOTE — Transitions of Care (Post Inpatient/ED Visit) (Signed)
 03/27/2024  Name: Keith Gibbs MRN: [id number] DOB: 05/28/1961  Today's TOC FU Call Status: Today's TOC FU Call Status:: Successful TOC FU Call Completed - TOC RN discussed options for [facility] RN BSW - patient declined stating, my plate is already full TOC FU Call Complete Date: 03/27/24 Patient's Name and Date of Birth confirmed.  Transition Care Management Follow-up Telephone Call How have you been since you were released from the hospital?: Better Any questions or concerns?: No  Items Reviewed: Did you receive and understand the discharge instructions provided?: Yes Medications obtained,verified, and reconciled?: Yes (Medications Reviewed) Any new allergies since your discharge?: No Dietary orders reviewed?: NA Do you have support at home?: Yes Name of Support/Comfort Primary Source: Lives with mother - who can assist as needed  Medications Reviewed Today: Medications Reviewed Today     Reviewed by Lauro Shona LABOR, RN (Registered Nurse) on 03/27/24 at 1554  Med List Status: <None>   Medication Order Taking? Sig Documenting Provider Last Dose Status Informant  acetaminophen  (TYLENOL ) 325 MG tablet [id number] Yes Take 1 tablet (325 mg total) by mouth every 6 (six) hours as needed for mild pain (pain score 1-3) (or temp > 100.5). Keith Cordella Hamilton, MD  Active Mother, Pharmacy Records  albuterol  (VENTOLIN  [employer] [employer]) 108 [id number] Base) MCG/ACT inhaler [id number] Yes Inhale 2 puffs into the lungs every 6 (six) hours as needed for wheezing or shortness of breath. Lorren Greig PARAS, NP  Active Mother, Pharmacy Records  aspirin  EC 81 MG tablet [id number] Yes Take 1 tablet (81 mg total) by mouth daily. Swallow whole. Keith Figures, MD  Active Mother, Pharmacy Records  atorvastatin  (LIPITOR ) 80 MG tablet [id number] Yes TAKE 1 TABLET BY MOUTH EVERY DAY Keith Fredy RAMAN, MD  Active Mother, Pharmacy Records  budesonide -glycopyrrolate -formoterol  (BREZTRI  AEROSPHERE) 160-9-4.8 MCG/ACT AERO inhaler [id number] Yes  Inhale 2 puffs into the lungs in the morning and at bedtime. Lorren Greig PARAS, NP  Active Mother, Pharmacy Records  celecoxib  (CELEBREX ) 100 MG capsule [id number] Yes TAKE 2 CAPSULES BY MOUTH 2 TIMES DAILY. Georgina Ozell LABOR, MD  Active   divalproex  (DEPAKOTE ) 500 MG DR tablet [id number] Yes Take half tablet in the morning and 3 tablets at bedtime Hoang, Daniela B, MD  Active Mother, Pharmacy Records  doxycycline  (VIBRA -TABS) 100 MG tablet [id number] Yes Take 1 tablet (100 mg total) by mouth every 12 (twelve) hours for 5 days. Keith Celinda Balo, MD  Active   DULoxetine  (CYMBALTA ) 30 MG capsule [id number] Yes Take 3 capsules (90 mg total) by mouth daily.  Patient taking differently: Take 30-60 mg by mouth See admin instructions. 30mg  qam, 60mg  qhs   Hoang, Daniela B, MD  Active Mother, Pharmacy Records  gabapentin  (NEURONTIN ) 600 MG tablet [id number] Yes Take 1 tablet (600 mg total) by mouth daily AND 2 tablets (1,200 mg total) at bedtime. Izella Ismael NOVAK, MD  Active Mother, Pharmacy Records  hydrOXYzine  (ATARAX ) 25 MG tablet [id number] Yes Take 1 tablet (25 mg total) by mouth 2 (two) times daily as needed. Izella Ismael NOVAK, MD  Active Mother, Pharmacy Records  isosorbide  mononitrate (IMDUR ) 30 MG 24 hr tablet [id number] Yes Take 1 tablet (30 mg total) by mouth 2 (two) times daily. Keith Denyse LABOR, MD  Active Mother, Pharmacy Records  Multiple Vitamins-Minerals (MENS 50+ MULTIVITAMIN) TABS [id number] Yes Take 1 tablet by mouth in the morning. [provider]  Active Mother, Pharmacy Records  nicotine  (NICODERM CQ  - DOSED IN MG/24 HOURS) 14 mg/24hr patch [id number]  Place 1  patch (14 mg total) onto the skin daily for 14 days.  Patient not taking: Reported on 03/27/2024   Isadora Hose, MD  Active   nicotine  (NICODERM CQ  - DOSED IN MG/24 HOURS) 21 mg/24hr patch [id number] Yes Place 1 patch (21 mg total) onto the skin daily. Isadora Hose, MD  Active   nicotine  (NICODERM CQ  - DOSED IN MG/24 HR) 7 mg/24hr  patch [id number]  Place 1 patch (7 mg total) onto the skin daily for 14 days.  Patient not taking: Reported on 03/27/2024   Isadora Hose, MD  Active   nicotine  polacrilex (NICOTINE  MINI) 2 MG lozenge [id number] Yes Take 1 lozenge (2 mg total) by mouth every 2 (two) hours as needed for smoking cessation. Isadora Hose, MD  Active   nitroGLYCERIN  (NITROSTAT ) 0.4 MG SL tablet [id number] Yes Place 1 tablet (0.4 mg total) under the tongue every 5 (five) minutes as needed for chest pain. Keith Figures, MD  Active Mother, Pharmacy Records           Med Note [name], [name]   Wed Mar 20, 2024  3:02 PM)    pantoprazole  (PROTONIX ) 40 MG tablet [id number] Yes Take 1 tablet (40 mg total) by mouth daily.  Patient taking differently: Take 40 mg by mouth in the morning.   Lorren Greig PARAS, NP  Active Mother, Pharmacy Records  predniSONE  (DELTASONE ) 10 MG tablet [id number]  Takes 4 tablets by mouth for 1 day, then 3 tablets for 1 day, then 2 tablets for 1 day, then 1 tablet for 1 day, and then stop. Keith Celinda Balo, MD  Consider Medication Status and Discontinue (Completed Course)   QUEtiapine  (SEROQUEL ) 200 MG tablet [id number] Yes Take 1.5 tablets (300 mg total) by mouth at bedtime. Izella Ismael NOVAK, MD  Active Mother, Pharmacy Records  traZODone  (DESYREL ) 100 MG tablet [id number] Yes Take 1 tablet (100 mg total) by mouth at bedtime. Izella Ismael NOVAK, MD  Active Mother, Pharmacy Records               Functional Questionnaire: Do you need assistance with bathing/showering or dressing?: No Do you need assistance with meal preparation?: No Do you need assistance with eating?: No Do you have difficulty maintaining continence: No Do you need assistance with getting out of bed/getting out of a chair/moving?: No Do you have difficulty managing or taking your medications?: Yes (mother fills pill box)  Follow up appointments reviewed: PCP Follow-up appointment confirmed?: Yes Date of PCP follow-up  appointment?: 04/05/24 Follow-up Provider: PCP, [facility] Follow-up appointment confirmed?: Yes Date of Specialist follow-up appointment?: 04/17/24 Follow-Up Specialty Provider:: Pulmonary, PET scan 9/8, PFTs and pulmonary appt 04/17/24 Dr Isadora Do you need transportation to your follow-up appointment?: No Do you understand care options if your condition(s) worsen?: Yes-patient verbalized understanding  SDOH Interventions Today    Flowsheet Row Most Recent Value  SDOH Interventions   Food Insecurity Interventions Intervention Not Indicated  Housing Interventions Intervention Not Indicated  Transportation Interventions Intervention Not Indicated  Utilities Interventions Intervention Not Indicated    Shona Prow RN, CCM [facility]  VBCI-Population Health RN Care Manager [phone]

## 2024-03-29 ENCOUNTER — Ambulatory Visit (INDEPENDENT_AMBULATORY_CARE_PROVIDER_SITE_OTHER): Payer: MEDICAID | Admitting: Licensed Clinical Social Worker

## 2024-03-29 ENCOUNTER — Ambulatory Visit (HOSPITAL_COMMUNITY): Payer: MEDICAID

## 2024-03-29 DIAGNOSIS — F1420 Cocaine dependence, uncomplicated: Secondary | ICD-10-CM

## 2024-03-29 DIAGNOSIS — F1290 Cannabis use, unspecified, uncomplicated: Secondary | ICD-10-CM

## 2024-03-29 DIAGNOSIS — F250 Schizoaffective disorder, bipolar type: Secondary | ICD-10-CM

## 2024-03-29 DIAGNOSIS — F17210 Nicotine dependence, cigarettes, uncomplicated: Secondary | ICD-10-CM

## 2024-03-29 DIAGNOSIS — F4310 Post-traumatic stress disorder, unspecified: Secondary | ICD-10-CM

## 2024-03-29 NOTE — Progress Notes (Signed)
 Daily Group Progress Note   Program: [facility] IOP     Group Time: 9 a.m. to 12 p.m.      Type of Therapy: Process and Psychoeducational    Topic: The therapist checks in with group members, assesses for SI/HI/psychosis and overall level of functioning. The therapist inquires about sobriety date and number of community support meetings attended since last session.   The therapist talks to group members about gambling addictions pointing out how the thought distortions related to a gambling addiction are the same as for chemical addictions. The therapist discusses how the perception of family members and loved ones impacted by a person's disease of addiction differs from the person with the addiction. The therapist explains that the person's impairment magnifies his or her cognitive distortions such that the person tends to view his or her using behavior as being much less problematic to others than it really is. The therapist concludes that having family members come to family sessions to share how the person's disease impacted them is beneficial both for the family and the person in recovery. The therapist cautions against beating up on themselves for what they did when using reminding them that they are not their disease.   The therapist discusses tobacco cessation and nicotine  replacement and how the need to avoid people, places, and things is also an essential part of quitting tobacco use.  The therapist shows a video on the stages of relapse noting that most people seem to think that relapse begins at mental relapse while ignoring emotional relapse altogether.  The therapist points out that people in emotional relapse often tend to isolate when they need to be forcing themselves to reach out to others even when they do not feel like doing so.    Summary: Ellsworth presents today rating both his depression and anxiety as a 4.  When he comes back from break during group, he has a strong odor of  cigarettes.  The therapist informs Rafeal that he thought he had quit smoking.  Cuinn says that he has continued to wear the patch and smoked 8 cigarettes the other day but is gotten down to 5.  When the therapist talks to him about the possible need for nicotine  gum in addition to the patch, Petr shows the therapist a container with nicotine  lozenges.  He says that he uses the lozenges but is not sure why he wants to smoke.  The therapist suggests that Jaydan might need to have something in his hand and mouth so recommends using something like a plastic straw along with the patch and lozenge.  Additionally, the therapist informs Charod that if he wants to quit smoking that he must avoid people, places, and things associated with tobacco use meaning that he should not be going out with others who are smoking during break.  Takeo reports no change in his sobriety date.  He says that he feels at peace with himself but has been a little bit antsy which he thinks might be related to taking steroids.  He says that his mother recently offered him some of her tramadol  to address some pain that he was experiencing but he told her that he could not take it as it would mess up his sobriety.  He says that he and his mother had a wonderful time together and are getting extremely close.  He recognizes that none of this would be possible if he were still in active addiction.  Platon says that he is  determined to remain sober and have new relationships.  He says that he was surprised that 2 guys from recovery came to visit him in the hospital which meant a lot to him.  He says that he is getting closer to his sponsor and trusting him more.  Santonio says that he was married 5 times but that his last wife was the one that he got right.  He talks about what he went through involving her death from cancer and says that he realized that he previously was holding a reservation and thinking that he would probably use if  something happened to his mother.  He now recognizes that he would not have to use if something happen to her.  He also talks about having not had a close relationship with his father and questioning where all of his feelings of anger came from.  After talking about emotional relapse, Jerry realizes that the reason he has not been calling his sponsor the past few days is that he has been dwelling on and worried about the medical scan he will have on Monday.  He is encouraged to reach out to his sponsor and talk with him about this.  Progress Towards Goals: Sincere reports no change in his sobriety date.   UDS collected: No Results: No   AA/NA attended?:  Yes   Sponsor?:  Yes     Elsie Maier, MA, LCSW, [facility], LCAS 03/29/2024

## 2024-04-01 ENCOUNTER — Ambulatory Visit (HOSPITAL_COMMUNITY): Payer: MEDICAID

## 2024-04-01 ENCOUNTER — Encounter
Admission: RE | Admit: 2024-04-01 | Discharge: 2024-04-01 | Disposition: A | Discharge status: Home / Self Care | Payer: MEDICAID | Source: Ambulatory Visit | Attending: Student in an Organized Health Care Education/Training Program | Admitting: Student in an Organized Health Care Education/Training Program

## 2024-04-01 DIAGNOSIS — R911 Solitary pulmonary nodule: Secondary | ICD-10-CM | POA: Diagnosis present

## 2024-04-01 LAB — GLUCOSE, CAPILLARY: Glucose-Capillary: 104 mg/dL — ABNORMAL HIGH (ref 70–99)

## 2024-04-01 MED ORDER — FLUDEOXYGLUCOSE F - 18 (FDG) INJECTION
11.6700 | Freq: Once | INTRAVENOUS | Status: AC | PRN
  Administered 2024-04-01: 11.67 via INTRAVENOUS

## 2024-04-02 LAB — TOXICOLOGY SCREEN, URINE: Creatinine, POC: 143 mg/dL

## 2024-04-03 ENCOUNTER — Ambulatory Visit (HOSPITAL_COMMUNITY): Payer: MEDICAID

## 2024-04-03 ENCOUNTER — Ambulatory Visit (INDEPENDENT_AMBULATORY_CARE_PROVIDER_SITE_OTHER): Payer: MEDICAID | Admitting: Licensed Clinical Social Worker

## 2024-04-03 DIAGNOSIS — F4310 Post-traumatic stress disorder, unspecified: Secondary | ICD-10-CM

## 2024-04-03 DIAGNOSIS — F1420 Cocaine dependence, uncomplicated: Secondary | ICD-10-CM | POA: Diagnosis not present

## 2024-04-03 DIAGNOSIS — F1290 Cannabis use, unspecified, uncomplicated: Secondary | ICD-10-CM

## 2024-04-03 DIAGNOSIS — F250 Schizoaffective disorder, bipolar type: Secondary | ICD-10-CM

## 2024-04-03 DIAGNOSIS — F17210 Nicotine dependence, cigarettes, uncomplicated: Secondary | ICD-10-CM

## 2024-04-03 NOTE — Progress Notes (Addendum)
 Daily Group Progress Note   Program: [facility]     Group Time: 9 a.m. to 12 p.m.      Type of Therapy: Process and Psychoeducational    Topic: The therapists check in with group members, assess for SI/HI/psychosis and overall level of functioning. The therapists inquire about sobriety date and number of community support meetings attended since last session.   The therapists introduce a new group member. The therapist explain from a biological and behavioral standpoint the reason that it is so difficult to stop using substances even when someone wants to do so.  The therapists emphasize that it is counterproductive to drown oneself and shame and guilt after a slip or relapse and that it is more productive to review this situation as an opportunity for learning and a way to develop a relapse prevention plan.  The therapists explain the importance of avoiding people who are in active addiction.  The therapist also caution against people trying to rescue loved ones who remain an active addiction pointing out that these people are more a danger to them than they are a help to them.  The therapists emphasize the importance of picking up the phone and calling one's sponsor or other people in recovery when feeling emotionally distressed even when not thinking of using.  The therapists make the observation that if a person begins to use a substance that here she does not necessarily have to continue to use adding that many persons with addiction conclude that they must continue using as a result of all or nothing thinking which is irrational.  The therapists discuss the risks associated with vaping informing group members that it is not a safe replacement for tobacco and that nicotine  replacement such as the patch is preferable.    Summary: Keith Gibbs presents today rating both his depression and anxiety as a 5.  He says that he is working on 60 days of sobriety.  He continues to smoke cigarettes while using the  patch and nicotine  lozenges saying that he is down to 5 cigarettes/day adding that he brought one with him to smoke at break.  Keith Gibbs says that his mother is an Scientist, forensic and that she not only recently offered him a tramadol  but brought him some cigarettes when he got out of the hospital.  He goes on to say that his mother means well.  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  He can reportedly get the cancer removed surgically from both lungs and then would then receive radiation treatment but says that he does not want to undergo radiation based on what he saw his wife go through.  The therapist encourages him to wait to speak with his physician prior to jumping to any conclusions about this testing.  Additionally, the therapist points out that even though his mother means well that Keith Gibbs needs to talk to her and explain nicely that she is not helping him out by bringing him cigarettes.  Keith Gibbs says that he has some cigarettes left from his last carton with the therapist suggesting that he can get rid of all of his remaining cigarettes, ashtrays, and lighters so as to no longer have triggers.  Keith Gibbs describes his mood as scared and confused.  He says that he is confused as to why he did this to himself and did not stop using substances  earlier.  He also says that he asks the question, why me.  At times, he questions what the point of remaining sober is; however, later in group, he talks about his regular meeting attendance, he is improving relationship with his sponsor, and the fact that he has gotten rid of all phone numbers associated with people who use substances and no longer associates with anyone who is an active addiction.   Progress Towards Goals: Keith Gibbs reports no change in his sobriety date.  UDS collected: Yes Results: No  AA/NA attended?:  Yes   Sponsor?:  Yes      Elsie Maier, MA, LCSW, [facility], LCAS Darice Simpler, LMFT, LCAS 04/03/2024

## 2024-04-05 ENCOUNTER — Ambulatory Visit (INDEPENDENT_AMBULATORY_CARE_PROVIDER_SITE_OTHER): Payer: MEDICAID | Admitting: Family

## 2024-04-05 ENCOUNTER — Encounter: Payer: Self-pay | Admitting: Family

## 2024-04-05 ENCOUNTER — Ambulatory Visit: Payer: Self-pay | Admitting: Student in an Organized Health Care Education/Training Program

## 2024-04-05 VITALS — BP 117/71 | HR 48 | Temp 97.9°F | Resp 18 | Ht 75.0 in | Wt 236.8 lb

## 2024-04-05 DIAGNOSIS — I10 Essential (primary) hypertension: Secondary | ICD-10-CM | POA: Diagnosis not present

## 2024-04-05 DIAGNOSIS — F1490 Cocaine use, unspecified, uncomplicated: Secondary | ICD-10-CM

## 2024-04-05 DIAGNOSIS — F1020 Alcohol dependence, uncomplicated: Secondary | ICD-10-CM

## 2024-04-05 DIAGNOSIS — R911 Solitary pulmonary nodule: Secondary | ICD-10-CM

## 2024-04-05 DIAGNOSIS — I2510 Atherosclerotic heart disease of native coronary artery without angina pectoris: Secondary | ICD-10-CM

## 2024-04-05 DIAGNOSIS — J441 Chronic obstructive pulmonary disease with (acute) exacerbation: Secondary | ICD-10-CM | POA: Diagnosis not present

## 2024-04-05 DIAGNOSIS — G2581 Restless legs syndrome: Secondary | ICD-10-CM

## 2024-04-05 DIAGNOSIS — K219 Gastro-esophageal reflux disease without esophagitis: Secondary | ICD-10-CM

## 2024-04-05 DIAGNOSIS — Z09 Encounter for follow-up examination after completed treatment for conditions other than malignant neoplasm: Secondary | ICD-10-CM

## 2024-04-05 DIAGNOSIS — F332 Major depressive disorder, recurrent severe without psychotic features: Secondary | ICD-10-CM

## 2024-04-05 DIAGNOSIS — E7800 Pure hypercholesterolemia, unspecified: Secondary | ICD-10-CM

## 2024-04-05 DIAGNOSIS — N179 Acute kidney failure, unspecified: Secondary | ICD-10-CM

## 2024-04-05 DIAGNOSIS — R001 Bradycardia, unspecified: Secondary | ICD-10-CM

## 2024-04-05 DIAGNOSIS — D508 Other iron deficiency anemias: Secondary | ICD-10-CM

## 2024-04-05 DIAGNOSIS — M25569 Pain in unspecified knee: Secondary | ICD-10-CM

## 2024-04-05 DIAGNOSIS — K739 Chronic hepatitis, unspecified: Secondary | ICD-10-CM

## 2024-04-05 DIAGNOSIS — F1190 Opioid use, unspecified, uncomplicated: Secondary | ICD-10-CM

## 2024-04-05 DIAGNOSIS — F319 Bipolar disorder, unspecified: Secondary | ICD-10-CM

## 2024-04-05 DIAGNOSIS — F419 Anxiety disorder, unspecified: Secondary | ICD-10-CM

## 2024-04-05 DIAGNOSIS — M549 Dorsalgia, unspecified: Secondary | ICD-10-CM

## 2024-04-05 DIAGNOSIS — M25519 Pain in unspecified shoulder: Secondary | ICD-10-CM

## 2024-04-05 DIAGNOSIS — F1120 Opioid dependence, uncomplicated: Secondary | ICD-10-CM

## 2024-04-05 DIAGNOSIS — G894 Chronic pain syndrome: Secondary | ICD-10-CM

## 2024-04-05 DIAGNOSIS — K7460 Unspecified cirrhosis of liver: Secondary | ICD-10-CM

## 2024-04-05 DIAGNOSIS — N400 Enlarged prostate without lower urinary tract symptoms: Secondary | ICD-10-CM

## 2024-04-05 NOTE — Progress Notes (Signed)
 Hospital follow up, patient is still having trouble breathing and feeling dizzy,  patient has cancer and wants to know information about it, back, shoulder and knee pain

## 2024-04-05 NOTE — Progress Notes (Signed)
 Patient ID: NAVI ERBER, male    DOB: 1961-03-01  MRN: [id number]  CC: Hospital Discharge Follow-Up  Subjective: Keith Gibbs is a 63 y.o. male who presents for hospital discharge follow-up.   His concerns today include:  Patient seen on 03/20/2024 - 03/23/2024 (3 days) at [facility] for COPD with acute exacerbation and additional diagnoses. Today patient denies red flag symptoms. Doing well on medication regimen, no issues/concerns. States he is established with Cardiology, Pulmonology, Oncology, and Psychiatry. States he participates in therapy and group meetings for history of substance abuse. He denies thoughts of self-harm, suicidal ideations, homicidal ideations. Requests referral to Orthopedics for chronic shoulder/back/knee pain. Denies recent trauma/injury and red flag symptoms.  Patient Active Problem List   Diagnosis Date Noted   AKI (acute kidney injury) (HCC) 03/22/2024   Symptomatic bradycardia 03/20/2024   Shoulder arthritis 08/31/2023   Lumbar radiculopathy 05/30/2023   Major depressive disorder, recurrent severe without psychotic features (HCC) 01/28/2023   S/P lumbar fusion 08/23/2022   Postlaminectomy syndrome, not elsewhere classified 08/12/2022   Substance induced mood disorder (HCC) 08/06/2022   Suicide attempt by drug overdose (HCC) 08/06/2022   Toxic encephalopathy 08/03/2022   Suicide ideation 08/03/2022   Encephalopathy 08/03/2022   Polysubstance abuse (HCC) 04/28/2022   Postoperative abdominal pain 02/28/2022   Acute cholecystitis 02/17/2022   Obstruction of nasal valve 01/07/2022   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   Pulmonary nodule 12/17/2021   CAD S/P percutaneous coronary angioplasty 12/17/2021   Epigastric abdominal pain    Nausea and vomiting    Tobacco dependence due to cigarettes 08/31/2021   Iron deficiency anemia secondary to inadequate dietary iron intake 08/31/2021   Acute lead-induced gout involving toe of left foot 08/31/2021    Encounter for lipid screening for cardiovascular disease 08/31/2021   Hypotension due to drugs 06/21/2021   Opioid withdrawal (HCC) 06/21/2021   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HCC) 06/21/2021   RLS (restless legs syndrome) 05/07/2021   Acquired deviated nasal septum 05/03/2021   Encounter for surveillance of abnormal nevi 05/03/2021   Flu vaccine need 05/03/2021   Substance abuse (HCC) 05/03/2021   Drug abuse and dependence (HCC) 05/03/2021   Need for shingles vaccine 05/03/2021   Right groin pain 01/29/2021   S/P TKR (total knee replacement) using cement, right 01/19/2021   Cigarette smoker 07/21/2020   Foot pain, bilateral 12/25/2019   Potential exposure to STD 12/12/2019   History of lumbar surgery 12/12/2019   Fatigue 12/12/2019   Penile lesion 12/12/2019   Right testicular pain 12/12/2019   Body mass index 28.0-28.9, adult 12/12/2019   History of COPD 12/12/2019   Tobacco use disorder, continuous 12/12/2019   Hepatoma (HCC) 11/05/2019   Anxiety 12/10/2018   Essential hypertension 09/18/2018   Hypercholesteremia 09/18/2018   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   Chronic anticoagulation (Plavix ) 04/19/2018   Pain medication agreement broken ([facility]) 04/19/2018   Cocaine use 04/18/2018   Cocaine abuse (HCC) (See 04/12/18 UDS) 04/18/2018   Marijuana abuse (See 04/12/18 UDS) 04/18/2018   Long term current use of opiate analgesic 04/12/2018   Cirrhosis of liver (HCC) 04/03/2018   BPH (benign prostatic hyperplasia) 04/03/2018   Hematuria, microscopic 04/03/2018   Osteoarthritis of the knee (Left) 11/02/2017   Lumbar facet syndrome (Bilateral) 11/01/2017   Spondylosis without myelopathy or radiculopathy, lumbosacral region 11/01/2017   Osteoarthritis of shoulder (Right) 11/01/2017   Osteoarthritis of acromioclavicular joint (Right) 11/01/2017   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) 11/01/2017   Osteoarthritis 11/01/2017  Cervicalgia 11/01/2017   Cervical facet syndrome  11/01/2017   DDD (degenerative disc disease), cervical 11/01/2017   DDD (degenerative disc disease), lumbar 11/01/2017   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) 09/18/2017   Other chronic pain 09/18/2017   Chronic musculoskeletal pain 09/18/2017   Chronic pain of right knee 09/11/2017   Chronic low back pain (Secondary Area of Pain) (Bilateral) 09/11/2017   Chronic hip pain [facility] Area of Pain) (Bilateral) (R>L) 09/11/2017   Chronic shoulder pain (Fourth Area of Pain) (Right) 09/11/2017   Spondylosis without myelopathy or radiculopathy, cervical region 09/11/2017   Chronic pain syndrome 09/11/2017   Opiate use 09/11/2017   Screen for STD (sexually transmitted disease) 09/11/2017   Disorder of skeletal system 09/11/2017   Problems influencing health status 09/11/2017   Overdose of medication, intentional self-harm, sequela (HCC) 12/28/2016   Severe recurrent major depression without psychotic features (HCC) 12/27/2016   Alcohol  use disorder, moderate, dependence (HCC) 12/27/2016   Cannabis use disorder, moderate, dependence (HCC) 12/27/2016   Bipolar 1 disorder (HCC) 05/27/2013   Myalgia 05/27/2013   Abnormal ejaculation 11/30/2012   Chest pain, unspecified 11/30/2012   Degenerative arthritis of hip 11/16/2012   Schizoaffective disorder (HCC) 06/27/2012   COPD with acute exacerbation (HCC) 06/27/2012   Chronic hepatitis C (HCC) 06/27/2012   GERD (gastroesophageal reflux disease) 06/27/2012   Complete rupture of rotator cuff 02/10/2012   Cocaine abuse in remission (HCC) 12/05/2011   Unspecified osteoarthritis, unspecified site 07/27/2011     Current Outpatient Medications on File Prior to Visit  Medication Sig Dispense Refill   acetaminophen  (TYLENOL ) 325 MG tablet Take 1 tablet (325 mg total) by mouth every 6 (six) hours as needed for mild pain (pain score 1-3) (or temp > 100.5). 60 tablet 0   albuterol  (VENTOLIN  HFA) 108 (90 Base) MCG/ACT inhaler Inhale 2 puffs into the  lungs every 6 (six) hours as needed for wheezing or shortness of breath. 18 g 2   aspirin  EC 81 MG tablet Take 1 tablet (81 mg total) by mouth daily. Swallow whole. 30 tablet 0   atorvastatin  (LIPITOR ) 80 MG tablet TAKE 1 TABLET BY MOUTH EVERY DAY 90 tablet 3   budesonide -glycopyrrolate -formoterol  (BREZTRI  AEROSPHERE) 160-9-4.8 MCG/ACT AERO inhaler Inhale 2 puffs into the lungs in the morning and at bedtime. 1 each 2   celecoxib  (CELEBREX ) 100 MG capsule TAKE 2 CAPSULES BY MOUTH 2 TIMES DAILY. 60 capsule 1   gabapentin  (NEURONTIN ) 600 MG tablet Take 1 tablet (600 mg total) by mouth daily AND 2 tablets (1,200 mg total) at bedtime. 90 tablet 1   hydrOXYzine  (ATARAX ) 25 MG tablet Take 1 tablet (25 mg total) by mouth 2 (two) times daily as needed. 270 tablet 0   isosorbide  mononitrate (IMDUR ) 30 MG 24 hr tablet Take 1 tablet (30 mg total) by mouth 2 (two) times daily. 30 tablet 1   Multiple Vitamins-Minerals (MENS 50+ MULTIVITAMIN) TABS Take 1 tablet by mouth in the morning.     nicotine  (NICODERM CQ  - DOSED IN MG/24 HOURS) 21 mg/24hr patch Place 1 patch (21 mg total) onto the skin daily. 42 patch 0   nicotine  polacrilex (NICOTINE  MINI) 2 MG lozenge Take 1 lozenge (2 mg total) by mouth every 2 (two) hours as needed for smoking cessation. 72 lozenge 3   nitroGLYCERIN  (NITROSTAT ) 0.4 MG SL tablet Place 1 tablet (0.4 mg total) under the tongue every 5 (five) minutes as needed for chest pain. 30 tablet 0   predniSONE  (DELTASONE ) 10 MG tablet Takes  4 tablets by mouth for 1 day, then 3 tablets for 1 day, then 2 tablets for 1 day, then 1 tablet for 1 day, and then stop. 10 tablet 0   QUEtiapine  (SEROQUEL ) 200 MG tablet Take 1.5 tablets (300 mg total) by mouth at bedtime. 90 tablet 0   traZODone  (DESYREL ) 100 MG tablet Take 1 tablet (100 mg total) by mouth at bedtime. 90 tablet 0   divalproex  (DEPAKOTE ) 500 MG DR tablet Take half tablet in the morning and 3 tablets at bedtime 105 tablet 2   DULoxetine  (CYMBALTA )  30 MG capsule Take 3 capsules (90 mg total) by mouth daily. (Patient taking differently: Take 30-60 mg by mouth See admin instructions. 30mg  qam, 60mg  qhs) 270 capsule 0   nicotine  (NICODERM CQ  - DOSED IN MG/24 HOURS) 14 mg/24hr patch Place 1 patch (14 mg total) onto the skin daily for 14 days. (Patient not taking: Reported on 03/27/2024) 14 patch 0   nicotine  (NICODERM CQ  - DOSED IN MG/24 HR) 7 mg/24hr patch Place 1 patch (7 mg total) onto the skin daily for 14 days. (Patient not taking: Reported on 03/27/2024) 14 patch 0   pantoprazole  (PROTONIX ) 40 MG tablet Take 1 tablet (40 mg total) by mouth daily. (Patient taking differently: Take 40 mg by mouth in the morning.) 90 tablet 0   No current facility-administered medications on file prior to visit.    Allergies  Allergen Reactions   Asenapine Other (See Comments) and Nausea And Vomiting    Increased tremors   Dextromethorphan Hbr    Guaifenesin     Latuda [Lurasidone Hcl] Other (See Comments)    Tremors     Lurasidone Other (See Comments) and Hives    Other Reaction(s): Angioedema   Phenylephrine      Social History   Socioeconomic History   Marital status: Widowed    Spouse name: Not on file   Number of children: Not on file   Years of education: Not on file   Highest education level: GED or equivalent  Occupational History   Occupation: disability   Occupation: stocking at gas station    Comment: 2 days per week  Tobacco Use   Smoking status: Every Day    Current packs/day: 1.50    Average packs/day: 1.5 packs/day for 42.4 years (63.7 ttl pk-yrs)    Types: Cigarettes    Start date: 10/26/1978    Last attempt to quit: 2000    Passive exposure: Current   Smokeless tobacco: Never   Tobacco comments:    Smokes 0.5 PPD - khj 03/26/2024        Started smoking at 63 yrs old    Smoked 1.5 PPD at his heaviest  Vaping Use   Vaping status: Never Used  Substance and Sexual Activity   Alcohol  use: Not Currently    Alcohol /week: 3.0  standard drinks of alcohol     Types: 3 Cans of beer per week    Comment: occassionally    Drug use: Not Currently    Types: Cocaine, Marijuana    Comment: last on saturday   Sexual activity: Not Currently    Partners: Female    Birth control/protection: None  Other Topics Concern   Not on file  Social History Narrative   Not on file   Social Drivers of Health   Financial Resource Strain: High Risk (09/19/2023)   Overall Financial Resource Strain (CARDIA)    Difficulty of Paying Living Expenses: Hard  Food Insecurity: No Food Insecurity (03/27/2024)  Hunger Vital Sign    Worried About Running Out of Food in the Last Year: Never true    Ran Out of Food in the Last Year: Never true  Recent Concern: Food Insecurity - Food Insecurity Present (03/23/2024)   Hunger Vital Sign    Worried About Running Out of Food in the Last Year: Sometimes true    Ran Out of Food in the Last Year: Sometimes true  Transportation Needs: No Transportation Needs (03/27/2024)   PRAPARE - Administrator, Civil Service (Medical): No    Lack of Transportation (Non-Medical): No  Physical Activity: Unknown (09/19/2023)   Exercise Vital Sign    Days of Exercise per Week: 0 days    Minutes of Exercise per Session: Not on file  Stress: Stress Concern Present (09/19/2023)   [employer] of Occupational Health - Occupational Stress Questionnaire    Feeling of Stress : Rather much  Social Connections: Moderately Isolated (09/19/2023)   Social Connection and Isolation Panel    Frequency of Communication with Friends and Family: More than three times a week    Frequency of Social Gatherings with Friends and Family: More than three times a week    Attends Religious Services: More than 4 times per year    Active Member of [employer] or Organizations: No    Attends Banker Meetings: Not on file    Marital Status: Widowed  Intimate Partner Violence: Not At Risk (03/27/2024)   Humiliation, Afraid,  Rape, and Kick questionnaire    Fear of Current or Ex-Partner: No    Emotionally Abused: No    Physically Abused: No    Sexually Abused: No    Family History  Problem Relation Age of Onset   Osteoarthritis Mother    Heart disease Mother    Hypertension Mother    Depression Mother    Heart disease Father    Early death Father    Hypertension Father    Heart attack Father    Hypertension Sister    Parkinson's disease Maternal Grandfather    Prostate cancer Neg Hx    Bladder Cancer Neg Hx    Kidney cancer Neg Hx    Tremor Neg Hx     Past Surgical History:  Procedure Laterality Date   ABDOMINAL SURGERY     removed small piece of intestines due to [facility] Diverticulosis   ANTERIOR LAT LUMBAR FUSION N/A 08/23/2022   Procedure: DIRECT LATERAL INTERBODY FUSION  LUMBAR TWO- LUMBAR THREE, LUMBAR THREE-LUMBAR FOUR , EXPLORE AND EXTEND FUSION LUMBAR TWO-LUMBAR FIVE, POSTERIOR DECOMPRESSION LUMBAR THREE-LUMBAR FOUR, LEFT LUMBAR TWO-LUMBAR THREE;  Surgeon: Debby Dorn MATSU, MD;  Location: [facility] OR;  Service: Neurosurgery;  Laterality: N/A;   APPENDECTOMY     BACK SURGERY     CARDIAC CATHETERIZATION Left 06/10/2016   Procedure: Left Heart Cath and Coronary Angiography;  Surgeon: Denyse DELENA Bathe, MD;  Location: [facility] INVASIVE CV LAB;  Service: Cardiovascular;  Laterality: Left;   CARDIAC CATHETERIZATION N/A 06/10/2016   Procedure: Coronary Stent Intervention;  Surgeon: Cara JONETTA Lovelace, MD;  Location: [facility] INVASIVE CV LAB;  Service: Cardiovascular;  Laterality: N/A;   CHOLECYSTECTOMY N/A 02/17/2022   Procedure: LAPAROSCOPIC CHOLECYSTECTOMY;  Surgeon: Stevie Herlene Righter, MD;  Location: [facility] OR;  Service: General;  Laterality: N/A;   COLON SURGERY     COLONOSCOPY     COLONOSCOPY WITH PROPOFOL  N/A 01/05/2017   Procedure: COLONOSCOPY WITH PROPOFOL ;  Surgeon: Therisa Bi, MD;  Location: [facility] ENDOSCOPY;  Service:  Endoscopy;  Laterality: N/A;   COLONOSCOPY WITH PROPOFOL  N/A 02/13/2020   Procedure:  COLONOSCOPY WITH PROPOFOL ;  Surgeon: Janalyn Keene NOVAK, MD;  Location: [facility] ENDOSCOPY;  Service: Endoscopy;  Laterality: N/A;   CORONARY ANGIOPLASTY WITH STENT PLACEMENT     CORONARY PRESSURE/FFR STUDY N/A 12/04/2019   Procedure: INTRAVASCULAR PRESSURE WIRE/FFR STUDY;  Surgeon: Wonda Sharper, MD;  Location: [facility] INVASIVE CV LAB;  Service: Cardiovascular;  Laterality: N/A;   ESOPHAGOGASTRODUODENOSCOPY (EGD) WITH PROPOFOL  N/A 01/05/2017   Procedure: ESOPHAGOGASTRODUODENOSCOPY (EGD) WITH PROPOFOL ;  Surgeon: Therisa Bi, MD;  Location[facility] ENDOSCOPY;  Service: Endoscopy;  Laterality: N/A;   ESOPHAGOGASTRODUODENOSCOPY (EGD) WITH PROPOFOL  N/A 02/13/2020   Procedure: ESOPHAGOGASTRODUODENOSCOPY (EGD) WITH PROPOFOL ;  Surgeon: Janalyn Keene NOVAK, MD;  Location: [facility] ENDOSCOPY;  Service: Endoscopy;  Laterality: N/A;   KNEE ARTHROSCOPY WITH MEDIAL MENISECTOMY Right 09/05/2017   Procedure: KNEE ARTHROSCOPY WITH MEDIAL AND LATERAL  MENISECTOMY PARTIAL SYNOVECTOMY;  Surgeon: Kathlynn Sharper, MD;  Location: [facility] ORS;  Service: Orthopedics;  Laterality: Right;   LEFT HEART CATH AND CORONARY ANGIOGRAPHY N/A 12/04/2019   Procedure: LEFT HEART CATH AND CORONARY ANGIOGRAPHY;  Surgeon: Wonda Sharper, MD;  Location: [facility] INVASIVE CV LAB;  Service: Cardiovascular;  Laterality: N/A;   LEFT HEART CATH AND CORONARY ANGIOGRAPHY Left 11/25/2022   Procedure: LEFT HEART CATH AND CORONARY ANGIOGRAPHY;  Surgeon: Fernand Denyse LABOR, MD;  Location: [facility] INVASIVE CV LAB;  Service: Cardiovascular;  Laterality: Left;   REVERSE SHOULDER ARTHROPLASTY Right 08/31/2023   Procedure: RIGHT REVERSE SHOULDER ARTHROPLASTY;  Surgeon: [name], MD;  Location: [facility] OR;  Service: Orthopedics;  Laterality: Right;   SHOULDER SURGERY Right 04/09/2012   SPINE SURGERY     TOTAL KNEE ARTHROPLASTY Right 01/19/2021   Procedure: TOTAL KNEE ARTHROPLASTY - Medford Amber to Assist;  Surgeon: Kathlynn Sharper, MD;  Location: [facility] ORS;  Service: Orthopedics;   Laterality: Right;    ROS: Review of Systems Negative except as stated above  PHYSICAL EXAM: BP 117/71   Pulse (!) 48   Temp 97.9 F (36.6 C) (Oral)   Resp 18   Ht 6' 3 (1.905 m)   Wt 236 lb 12.8 oz (107.4 kg)   SpO2 96%   BMI 29.60 kg/m   Physical Exam HENT:     Head: Normocephalic and atraumatic.     Nose: Nose normal.     Mouth/Throat:     Mouth: Mucous membranes are moist.     Pharynx: Oropharynx is clear.  Eyes:     Extraocular Movements: Extraocular movements intact.     Conjunctiva/sclera: Conjunctivae normal.     Pupils: Pupils are equal, round, and reactive to light.  Cardiovascular:     Rate and Rhythm: Bradycardia present.     Pulses: Normal pulses.     Heart sounds: Normal heart sounds.  Pulmonary:     Effort: Pulmonary effort is normal.     Breath sounds: Normal breath sounds.  Musculoskeletal:        General: Normal range of motion.     Right shoulder: Normal.     Left shoulder: Normal.     Right upper arm: Normal.     Left upper arm: Normal.     Right elbow: Normal.     Left elbow: Normal.     Right forearm: Normal.     Left forearm: Normal.     Right wrist: Normal.     Left wrist: Normal.     Right hand: Normal.     Left hand: Normal.  Cervical back: Normal, normal range of motion and neck supple.     Thoracic back: Normal.     Lumbar back: Normal.     Right hip: Normal.     Left hip: Normal.     Right upper leg: Normal.     Left upper leg: Normal.     Right knee: Normal.     Left knee: Normal.     Right lower leg: Normal.     Left lower leg: Normal.     Right ankle: Normal.     Left ankle: Normal.     Right foot: Normal.     Left foot: Normal.  Neurological:     General: No focal deficit present.     Mental Status: He is alert and oriented to person, place, and time.  Psychiatric:        Mood and Affect: Mood normal.        Behavior: Behavior normal.     ASSESSMENT AND PLAN: 1. Hospital discharge follow-up (Primary) -  Reviewed hospital course, current medications, ensured proper follow-up in place, and addressed concerns.  - Routine screening.  - Basic Metabolic Panel - CBC  2. COPD with acute exacerbation (HCC) - Patient today in office with no cardiopulmonary/acute distress.  - Continue present management.  - Keep all scheduled appointments with established Pulmonology.   3. Essential hypertension 4. Symptomatic bradycardia 5. CAD S/P percutaneous coronary angioplasty 6. Hypercholesteremia - Continue present management. - Keep all scheduled appointments with established Cardiology.   7. Anxiety 8. Bipolar 1 disorder (HCC) 9. Major depressive disorder, recurrent severe without psychotic features (HCC) 10. Alcohol  use disorder, moderate, dependence (HCC) 11. Cocaine use 12. 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HCC) 13. Opiate use - Patient denies thoughts of self-harm, suicidal ideations, homicidal ideations. - Continue present management.  - Keep all scheduled appointments with established Psychiatry.  14. Chronic pain syndrome 15. Shoulder pain, unspecified chronicity, unspecified laterality 16. Back pain, unspecified back location, unspecified back pain laterality, unspecified chronicity 17. Knee pain, unspecified chronicity, unspecified laterality - Continue present management.  - Referral to Orthopedic Surgery for evaluation/management. - Ambulatory referral to Orthopedic Surgery  18. Chronic hepatitis (HCC) 19. Hepatic cirrhosis, unspecified hepatic cirrhosis type, unspecified whether ascites present (HCC) - Continue present management.  - Referral to Infectious Disease and Gastroenterology for evaluation/management. - Ambulatory referral to Infectious Disease - Ambulatory referral to Gastroenterology  20. AKI (acute kidney injury) (HCC) - Routine screening (see #1).   21. Gastroesophageal reflux disease, unspecified whether esophagitis present - Continue present management.  - Follow-up with  primary provider as scheduled.   22. Iron deficiency anemia secondary to inadequate dietary iron intake - Continue present management.  - Routine screening (see #1).  - Follow-up with primary provider as scheduled.   23. RLS (restless legs syndrome) - Continue present management.  - Follow-up with primary provider as scheduled.  24. Benign prostatic hyperplasia, unspecified whether lower urinary tract symptoms present - Continue present management.  - Follow-up with primary provider as scheduled.   Patient was given the opportunity to ask questions.  Patient verbalized understanding of the plan and was able to repeat key elements of the plan. Patient was given clear instructions to go to Emergency Department or return to medical center if symptoms don't improve, worsen, or new problems develop.The patient verbalized understanding.   Orders Placed This Encounter  Procedures   Basic Metabolic Panel   CBC   Ambulatory referral to Orthopedic Surgery   Ambulatory referral to Infectious  Disease   Ambulatory referral to Gastroenterology    Return for Follow-up as needed.  Greig JINNY Drones, NP

## 2024-04-06 LAB — BASIC METABOLIC PANEL WITH GFR
BUN/Creatinine Ratio: 17 (ref 10–24)
BUN: 16 mg/dL (ref 8–27)
CO2: 23 mmol/L (ref 20–29)
Calcium: 8.6 mg/dL (ref 8.6–10.2)
Chloride: 101 mmol/L (ref 96–106)
Creatinine, Ser: 0.94 mg/dL (ref 0.76–1.27)
Glucose: 76 mg/dL (ref 70–99)
Potassium: 4.4 mmol/L (ref 3.5–5.2)
Sodium: 138 mmol/L (ref 134–144)
eGFR: 91 mL/min/1.73 (ref >59)

## 2024-04-06 LAB — CBC
Hematocrit: 47.4 % (ref 37.5–51.0)
Hemoglobin: 15.3 g/dL (ref 13.0–17.7)
MCH: 33 pg (ref 26.6–33.0)
MCHC: 32.3 g/dL (ref 31.5–35.7)
MCV: 102 fL — ABNORMAL HIGH (ref 79–97)
Platelets: 160 x10E3/uL (ref 150–450)
RBC: 4.64 x10E6/uL (ref 4.14–5.80)
RDW: 13.9 % (ref 11.6–15.4)
WBC: 6 x10E3/uL (ref 3.4–10.8)

## 2024-04-08 ENCOUNTER — Encounter (HOSPITAL_COMMUNITY): Payer: Self-pay | Admitting: Medical

## 2024-04-08 ENCOUNTER — Ambulatory Visit: Payer: Self-pay | Admitting: Family

## 2024-04-08 ENCOUNTER — Ambulatory Visit (INDEPENDENT_AMBULATORY_CARE_PROVIDER_SITE_OTHER): Payer: MEDICAID | Admitting: Medical

## 2024-04-08 DIAGNOSIS — F411 Generalized anxiety disorder: Secondary | ICD-10-CM

## 2024-04-08 DIAGNOSIS — F250 Schizoaffective disorder, bipolar type: Secondary | ICD-10-CM

## 2024-04-08 DIAGNOSIS — F17210 Nicotine dependence, cigarettes, uncomplicated: Secondary | ICD-10-CM

## 2024-04-08 DIAGNOSIS — F1420 Cocaine dependence, uncomplicated: Secondary | ICD-10-CM | POA: Diagnosis not present

## 2024-04-08 DIAGNOSIS — F1011 Alcohol abuse, in remission: Secondary | ICD-10-CM

## 2024-04-08 DIAGNOSIS — F4321 Adjustment disorder with depressed mood: Secondary | ICD-10-CM

## 2024-04-08 DIAGNOSIS — F1111 Opioid abuse, in remission: Secondary | ICD-10-CM

## 2024-04-08 DIAGNOSIS — F1091 Alcohol use, unspecified, in remission: Secondary | ICD-10-CM

## 2024-04-08 DIAGNOSIS — F4310 Post-traumatic stress disorder, unspecified: Secondary | ICD-10-CM

## 2024-04-08 DIAGNOSIS — G894 Chronic pain syndrome: Secondary | ICD-10-CM

## 2024-04-08 DIAGNOSIS — C3411 Malignant neoplasm of upper lobe, right bronchus or lung: Secondary | ICD-10-CM

## 2024-04-08 DIAGNOSIS — F1290 Cannabis use, unspecified, uncomplicated: Secondary | ICD-10-CM

## 2024-04-08 NOTE — Progress Notes (Unsigned)
 Daily Group Progress Note   Program: CD IOP     Group Time: 9 a.m. to 12 p.m.      Type of Therapy: Process and Psychoeducational    Topic: The therapists check in with group members, assess for SI/HI/psychosis and overall level of functioning. The therapists inquire about sobriety date and number of community support meetings attended since last session.   The therapists make the observation that group members who fail to attend IOP on a consistent basis and those who do not attend regular sober support meetings tend to relapse at a much higher rate than those who attend consistently.  The therapist frame a relapse or a slip as an opportunity for learning and suggest that people must move from being problem focused towards being solution focused.  The therapists explained that those in early recovery are not in a position to be trying to help others and must be selfish in their recovery.  The therapists talk about what is meant by the phrase easy does it and also why the program encourages people to not compare their insides to other people's outsides.  The main thing that is stressed today is the need for people in recovery to not isolate and tried to navigate life problems on their own but rather to reach out to others to obtain feedback and support.  The therapists provide a list of character defects, resentments, and fears for group members to review explaining that those who are on step 1 can use this material when they eventually get to working step 4.    Summary: Keith Gibbs presents today rating his depression and anxiety both as a 5.  He says that he has increased his cigarette smoking back up to 10 cigarettes admitting that he has 2 cigarettes with him today from a pack that he purchased recently.  When the therapist makes the observation that Keith Gibbs's life literally depends completely stopping smoking given that he has lung cancer, Keith Gibbs is able to recognize that it is insane for him to  continue to smoke.  The therapist reminds Keith Gibbs that he has the tools to stop smoking doing it the same way that he has stopped using drugs and alcohol .  Keith Gibbs says that he is going to contact the tobacco quit line as they will send supportive text messages to him and provide other resources.  After reviewing the handouts on resentments, etc., Keith Gibbs talks about the realization that he was not a good husband and was not a good boyfriend either.  He says that when he was a child that he had to get a lot of painful medical tests all tests to determine the reason that he was having frequent nosebleeds and eventually had to have part of his colon removed to resolve this issue.  Consequently, he says that he still has some resentment towards the doctors for having caused him to feel this pain.  Another group member points out that all of us  have a 63-year-old child living within them who we are trying to console.  Keith Gibbs describes his mood as being nervous and fearful.  He spoke to his doctor and says that he did not learn anything that he that he did not already know but the good news is that the cancer has not spread to other parts of his body.    Progress Towards Goals: Keith Gibbs reports no change in his sobriety date.  UDS collected: Yes Results: Yes, negative for drugs and alcohol  except for nicotine   AA/NA attended?:  Yes   Sponsor?:  Yes     Elsie Maier, MA, LCSW, [facility], LCAS Darice Simpler, [place], LCAS 04/08/2024

## 2024-04-08 NOTE — Progress Notes (Incomplete)
 [facility] Follow-up Outpatient CDIOP Date: 04/08/2024  Admission Date:03/06/2024  Sobriety date:January 30 2024  Subjective: I get this feeling inside  HPI : CD IOP Provider FU Gerrell is now a little over 60 days clean and sober. He has been completely compliant with his IOP plan but c/o being overwhelmed by everything he has to do in addition.(IOP/Appts and NA meetings)   He has been diagnosed with a Stage 1A RUL lung cancer an is being worked up for surgery. He acknowledges how fortunate he is in this regard. He has had multiple appointments as a result.He also c/o a feeling of inner ease that he gets a times including today. When asked what he thinks might be the trigger he admits its relationship to being overwhelmed.  Rohith is finding friends support and people who care about mein NA.He wonders if he ius on too many medications. His mother has voiced the same concern and an inbterset in talking to hisPsychiatric provider. He notes he and his mother are getting along much better  Counselor's report:  Summary: Kiley presents today rating his depression and anxiety both as a 5.  He says that he has increased his cigarette smoking back up to 10 cigarettes admitting that he has 2 cigarettes with him today from a pack that he purchased recently.  When the therapist makes the observation that Deontra's life literally depends completely stopping smoking given that he has lung cancer, Myka is able to recognize that it is insane for him to continue to smoke.  The therapist reminds Teddie that he has the tools to stop smoking doing it the same way that he has stopped using drugs and alcohol .  Derward says that he is going to contact the tobacco quit line as they will send supportive text messages to him and provide other resources.   After reviewing the handouts on resentments, etc., Javarius talks about the realization that he was not a good husband and was not a good boyfriend either.   He says that when he was a child that he had to get a lot of painful medical tests all tests to determine the reason that he was having frequent nosebleeds and eventually had to have part of his colon removed to resolve this issue.  Consequently, he says that he still has some resentment towards the doctors for having caused him to feel this pain.  Another group member points out that all of us  have a 33-year-old child living within them who we are trying to console.   Kwadwo describes his mood as being nervous and fearful.  He spoke to his doctor and says that he did not learn anything that he that he did not already know but the good news is that the cancer has not spread to other parts of his body. Review of Systems: Psychiatric: Agitation: See Counselor report Hallucination: No Depressed Mood: see Counselor reports Insomnia: No Hypersomnia: No Altered Concentration: No Feels Worthless: No Grandiose Ideas: No Belief In Special Powers: No New/Increased Substance Abuse: No Compulsions: In early withdrawal   Neurologic: Headache: No Seizure: No Paresthesias: No   Current Medications:   Your Medication List acetaminophen  325 MG tablet Commonly known as: TYLENOL  Take 1 tablet (325 mg total) by mouth every 6 (six) hours as needed for mild pain (pain score 1-3) (or temp > 100.5).  albuterol  108 (90 Base) MCG/ACT inhaler Commonly known as: VENTOLIN  HFA Inhale 2 puffs into the lungs every 6 (six) hours as needed  for wheezing or shortness of breath.  aspirin  EC 81 MG tablet Take 1 tablet (81 mg total) by mouth daily. Swallow whole.  atorvastatin  80 MG tablet Commonly known as: LIPITOR  TAKE 1 TABLET BY MOUTH EVERY DAY  Breztri  Aerosphere 160-9-4.8 MCG/ACT Aero inhaler Generic drug: budesonide -glycopyrrolate -formoterol  Inhale 2 puffs into the lungs in the morning and at bedtime.  celecoxib  100 MG capsule Commonly known as: CELEBREX  TAKE 2 CAPSULES BY MOUTH 2 TIMES DAILY.  divalproex  500  MG DR tablet Commonly known as: DEPAKOTE  Take half tablet in the morning and 3 tablets at bedtime  DULoxetine  30 MG capsule Commonly known as: CYMBALTA  Take 3 capsules (90 mg total) by mouth daily. According to our records, you may have been taking this medication differently.  gabapentin  600 MG tablet Commonly known as: NEURONTIN  Take 1 tablet (600 mg total) by mouth daily AND 2 tablets (1,200 mg total) at bedtime.  hydrOXYzine  25 MG tablet Commonly known as: ATARAX  Take 1 tablet (25 mg total) by mouth 2 (two) times daily as needed.  isosorbide  mononitrate 30 MG 24 hr tablet Commonly known as: IMDUR  Take 1 tablet (30 mg total) by mouth 2 (two) times daily.  Mens 50+ Multivitamin Tabs Take 1 tablet by mouth in the morning.  * nicotine  21 mg/24hr patch Commonly known as: NICODERM CQ  - dosed in mg/24 hours Place 1 patch (21 mg total) onto the skin daily.  * nicotine  14 mg/24hr patch Commonly known as: NICODERM CQ  - dosed in mg/24 hours Place 1 patch (14 mg total) onto the skin daily for 14 days.  * nicotine  7 mg/24hr patch Commonly known as: NICODERM CQ  - dosed in mg/24 hr Place 1 patch (7 mg total) onto the skin daily for 14 days.  nicotine  polacrilex 2 MG lozenge Commonly known as: Nicotine  Mini Take 1 lozenge (2 mg total) by mouth every 2 (two) hours as needed for smoking cessation.  nitroGLYCERIN  0.4 MG SL tablet Commonly known as: Nitrostat  Place 1 tablet (0.4 mg total) under the tongue every 5 (five) minutes as needed for chest pain.  pantoprazole  40 MG tablet Commonly known as: PROTONIX  Take 1 tablet (40 mg total) by mouth daily. According to our records, you may have been taking this medication differently.  predniSONE  10 MG tablet Commonly known as: DELTASONE  Takes 4 tablets by mouth for 1 day, then 3 tablets for 1 day, then 2 tablets for 1 day, then 1 tablet for 1 day, and then stop.  QUEtiapine  200 MG tablet Commonly known as: SEROquel  Take 1.5 tablets (300 mg total) by mouth at  bedtime.  traZODone  100 MG tablet Commonly known as: DESYREL  Take 1 tablet (100 mg total) by mouth at bedtime.     Mental Status Examination  Appearance:Casual Well Groomed Alert: Yes Attention: good  Cooperative: Yes Eye Contact: Good Speech: Clear and coherent, rate WNL Psychomotor Activity: Normal Memory:Trauma informed  Concentration/Attention: Normal/intact Oriented: person, place, time/date and situation Mood: Anxious Affect: Appropriate and Congruent Thought Processes and Associations: Coherent and Intact Fund of Knowledge:WDL Thought Content: WDL No SI/HI Insight:Limited but improving Judgement:Improving   LID:Rozjm 03/27/2024   PDMP:Gabapentin    Diagnosis:  Cocaine use disorder, severe, dependence (HCC) Cannabis use disorder PTSD (post-traumatic stress disorder) Schizoaffective disorder, bipolar type (HCC) Tobacco dependence due to cigarettes Generalized anxiety disorder 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 Drug induced) remitted Other medical problems listed in problem list  Treatment Plan:Per admission and Counselors Suggested he have his mother speak with Dr Izella at next Medication Management visit but I would be glad to do what I could (04/30/24)    Carlin Emmer, PA-CPatient ID: Keith Gibbs, male   DOB: 1961/02/06, 63 y.o.   MRN: [id number]              Counselor on 04/08/2024

## 2024-04-09 ENCOUNTER — Other Ambulatory Visit: Payer: Self-pay | Admitting: Cardiovascular Disease

## 2024-04-09 DIAGNOSIS — I10 Essential (primary) hypertension: Secondary | ICD-10-CM

## 2024-04-09 DIAGNOSIS — I952 Hypotension due to drugs: Secondary | ICD-10-CM

## 2024-04-09 DIAGNOSIS — I2510 Atherosclerotic heart disease of native coronary artery without angina pectoris: Secondary | ICD-10-CM

## 2024-04-10 ENCOUNTER — Ambulatory Visit (INDEPENDENT_AMBULATORY_CARE_PROVIDER_SITE_OTHER): Payer: MEDICAID

## 2024-04-10 DIAGNOSIS — F1111 Opioid abuse, in remission: Secondary | ICD-10-CM

## 2024-04-10 DIAGNOSIS — F1420 Cocaine dependence, uncomplicated: Secondary | ICD-10-CM

## 2024-04-10 DIAGNOSIS — F411 Generalized anxiety disorder: Secondary | ICD-10-CM

## 2024-04-10 DIAGNOSIS — F17210 Nicotine dependence, cigarettes, uncomplicated: Secondary | ICD-10-CM

## 2024-04-10 DIAGNOSIS — F1290 Cannabis use, unspecified, uncomplicated: Secondary | ICD-10-CM

## 2024-04-10 DIAGNOSIS — F250 Schizoaffective disorder, bipolar type: Secondary | ICD-10-CM

## 2024-04-10 NOTE — Progress Notes (Signed)
 Daily Group Progress Note   Program: CD IOP     Group Time: 9 a.m. to 12 p.m.      Type of Therapy: Process and Psychoeducational    Topic: The therapists check in with group members, assess for SI/HI/psychosis and overall level of functioning. The therapists inquire about sobriety date and number of community support meetings attended since last session.   The therapists share the 11 steps of relapse as presented by [facility]. These 11 steps are as follows:  1. Unhealthy Emotions, 2. Denial, 3. Compulsive Behaviors, 4. Triggers, 5. [employer], 6.External Chaos, 7. Loss of Control, 8. Addictive Thinking, 9. High Risk Situations, 10, Relapse (physical), 11. Aftermath of relapse. Therapists compare how the 11 steps are details of the 3 steps of relapse which Elspeth Railing, PhD which include emotional, mental and physical. Therapists asks group members to identify where they see themselves in this framework and discuss it.    Summary: Keith Gibbs presents today rating his depression and anxiety both as a 5.  He reports the same sobriety date. He is attending meetings and has a sponsor. Keith Gibbs says he feels burned out and did not really feel like coming today. He explains he attends meetings each day in addition to attending CD IOP 3 x per week for 3 hours each. Keith Gibbs says he is also struggling with his health so he needs extra sleep. Therapists ask him to consider lessening the amount of meetings while he is in CD IOP as this takes a good chunk of time and he could attend meetings on the days he is not in CD IOP.  Keith Gibbs says his sponsor does not agree with this and says he needs to attend daily meetings.  Keith Gibbs says his take away from today is that he is putting things in order. He also says his Higher [employer] and recovery come first and his family is next.  Progress Towards Goals: Keith Gibbs reports no change in his sobriety date.  UDS collected: No  Results: none  AA/NA  attended?:  Yes   Sponsor?:  Yes   Darice Simpler, MS, LMFT, LCAS [address], [place], [place], [facility], [place], LCAS  04/10/2024

## 2024-04-11 ENCOUNTER — Other Ambulatory Visit: Payer: Self-pay

## 2024-04-11 ENCOUNTER — Other Ambulatory Visit (HOSPITAL_COMMUNITY): Payer: Self-pay

## 2024-04-12 ENCOUNTER — Ambulatory Visit (HOSPITAL_COMMUNITY): Payer: MEDICAID

## 2024-04-12 LAB — TOXICOLOGY SCREEN, URINE: Creatinine, POC: 89 mg/dL

## 2024-04-12 NOTE — Progress Notes (Signed)
 The proposed treatment discussed in conference is for discussion purpose only and is not a binding recommendation.  The patients have not been physically examined, or presented with their treatment options.  Therefore, final treatment plans cannot be decided.

## 2024-04-15 ENCOUNTER — Telehealth (HOSPITAL_COMMUNITY): Payer: Self-pay | Admitting: Licensed Clinical Social Worker

## 2024-04-15 ENCOUNTER — Ambulatory Visit (HOSPITAL_COMMUNITY): Payer: MEDICAID

## 2024-04-15 NOTE — Telephone Encounter (Signed)
 The therapist calls Keith Gibbs who sounds extremely congested saying that he did not make group as he is still sick. He says that he meant to call but fell back to sleep. He says that he is going to call his doctor as he cannot shake this. He took a COVID test yesterday indicating that it was negative.  Zell Maier, MA, LCSW, [facility], LCAS 04/15/2024

## 2024-04-16 ENCOUNTER — Ambulatory Visit: Payer: MEDICAID | Attending: Family Medicine | Admitting: Family Medicine

## 2024-04-16 VITALS — BP 112/67 | HR 53 | Ht 75.0 in | Wt 243.4 lb

## 2024-04-16 DIAGNOSIS — J441 Chronic obstructive pulmonary disease with (acute) exacerbation: Secondary | ICD-10-CM

## 2024-04-16 DIAGNOSIS — C3490 Malignant neoplasm of unspecified part of unspecified bronchus or lung: Secondary | ICD-10-CM | POA: Diagnosis not present

## 2024-04-16 MED ORDER — BENZONATATE 200 MG PO CAPS: 200.0000 mg | ORAL_CAPSULE | Freq: Two times a day (BID) | ORAL | 0 refills | Status: DC | PRN

## 2024-04-16 MED ORDER — AMOXICILLIN-POT CLAVULANATE 875-125 MG PO TABS: 1.0000 | ORAL_TABLET | Freq: Two times a day (BID) | ORAL | 0 refills | Status: DC

## 2024-04-16 MED ORDER — PREDNISONE 20 MG PO TABS: 20.0000 mg | ORAL_TABLET | Freq: Every day | ORAL | 0 refills | Status: DC

## 2024-04-16 NOTE — Patient Instructions (Signed)
 VISIT SUMMARY:  During your visit, we discussed your respiratory symptoms, including chest congestion, wheezing, and a persistent cough. We also reviewed your current medications and made some adjustments to better manage your condition.  YOUR PLAN:  -COPD EXACERBATION: A COPD exacerbation is a worsening of your chronic obstructive pulmonary disease symptoms, often triggered by an infection. We believe your current symptoms are due to an acute upper respiratory infection. We have prescribed Augmentin  to treat the infection, prednisone  to reduce inflammation, and Tessalon  to help with your cough. Additionally, please stay hydrated and get plenty of rest. Inform your pulmonary function test providers about your current medications.  -ACUTE UPPER RESPIRATORY TRACT INFECTION: An acute upper respiratory tract infection is a bacterial infection affecting your nose, throat, and airways, which is likely causing your yellow sputum and facial pain. We have prescribed Augmentin  to treat the infection, prednisone  to reduce inflammation, and Tessalon  to help with your cough. Please stay hydrated and get plenty of rest.  -MALIGNANT NEOPLASM OF BRONCHUS OR LUNG: This refers to your lung cancer. We have updated your management plan during this visit.  INSTRUCTIONS:  Please follow the prescribed medication regimen and inform your pulmonary function test providers about your current medications. Stay hydrated and get plenty of rest. If your symptoms do not improve or worsen, please contact our office.

## 2024-04-16 NOTE — Progress Notes (Addendum)
 Subjective:  Patient ID: Keith Gibbs, male    DOB: 28-Sep-1960  Age: 63 y.o. MRN: [id number]  CC: Cough (Wheezing and chest congestion for 1 week.)     Discussed the use of AI scribe software for clinical note transcription with the patient, who gave verbal consent to proceed.  History of Present Illness Keith Gibbs is a 63 year old male patient of Greig Gravely, NP with history of COPD, CAD, hypertension, GERD, liver cirrhosis, DDD of cervical and lumbar spine, chronic pain, new diagnosis of bronchogenic carcinoma who presents with respiratory symptoms.  He initially experienced symptoms of a cold, including a runny nose and scratchy throat, which progressed to chest congestion and wheezing. He has a persistent cough producing yellow sputum and a heavy feeling in his chest. These symptoms have persisted for over a week.  Initially, he had a fever, which has now transitioned to cold sweats. He denies recent contact with sick individuals. He experiences facial pressure and pain around his cheekbones, and a scratchy sensation in his throat. Over-the-counter cough medicine and albuterol  have not provided relief. He also uses a Breztri  inhaler.  He reports mild to moderate headaches with pressure around his eyes and a sensation of fullness in his head. He frequently experiences chills and notes that his right ear feels 'short.' He also experiences dizziness and lightheadedness, describing it as feeling like his 'brain slots around.'  He experiences malaise and lethargy, often feeling like lying down and not wanting to get up, though this is not a new symptom and has been present for a while. He confirms having a runny nose and has been using over-the-counter medications to manage his symptoms. He performed a home COVID test which was negative.    Past Medical History:  Diagnosis Date   Anginal pain    Anxiety    Arthritis    Bipolar 1 disorder (HCC)    Bursitis    CAD (coronary  artery disease)    Chronic pain    COPD (chronic obstructive pulmonary disease) (HCC)    Current use of long term anticoagulation    DAPT (ASA + clopidogrel )   Depression    Diverticulitis    Dyspnea    GERD (gastroesophageal reflux disease)    Grade I diastolic dysfunction    Hepatitis C 2012   No longer has Hep C   HLD (hyperlipidemia)    Hypertension    MI (myocardial infarction) (HCC)    Polysubstance abuse (HCC)    cocaine, marijuana, ETOH   PUD (peptic ulcer disease)    S/P angioplasty with stent 06/10/2016   a.) 90% stenosis of pLAD to mLAD - 2.5 x 18 mm Xience Alpine (DES x 1) placed to pLAD   S/P PTCA (percutaneous transluminal coronary angioplasty) 12/04/2019   a.) 60% in stent restenosis of DES to pLAD; LVEF 65%.   Schizophrenia (HCC)    Stroke [facility])    Valvular insufficiency    a.) Mild MR, TR, PR; mild to moderate AR on 03/05/2018 TTE    Past Surgical History:  Procedure Laterality Date   ABDOMINAL SURGERY     removed small piece of intestines due to [facility] Diverticulosis   ANTERIOR LAT LUMBAR FUSION N/A 08/23/2022   Procedure: DIRECT LATERAL INTERBODY FUSION  LUMBAR TWO- LUMBAR THREE, LUMBAR THREE-LUMBAR FOUR , EXPLORE AND EXTEND FUSION LUMBAR TWO-LUMBAR FIVE, POSTERIOR DECOMPRESSION LUMBAR THREE-LUMBAR FOUR, LEFT LUMBAR TWO-LUMBAR THREE;  Surgeon: Debby Dorn MATSU, MD;  Location: [facility] OR;  Service:  Neurosurgery;  Laterality: N/A;   APPENDECTOMY     BACK SURGERY     CARDIAC CATHETERIZATION Left 06/10/2016   Procedure: Left Heart Cath and Coronary Angiography;  Surgeon: Denyse DELENA Bathe, MD;  Location: [facility] INVASIVE CV LAB;  Service: Cardiovascular;  Laterality: Left;   CARDIAC CATHETERIZATION N/A 06/10/2016   Procedure: Coronary Stent Intervention;  Surgeon: Cara JONETTA Lovelace, MD;  Location: [facility] INVASIVE CV LAB;  Service: Cardiovascular;  Laterality: N/A;   CHOLECYSTECTOMY N/A 02/17/2022   Procedure: LAPAROSCOPIC CHOLECYSTECTOMY;  Surgeon: Stevie Herlene Righter,  MD;  Location: [facility] OR;  Service: General;  Laterality: N/A;   COLON SURGERY     COLONOSCOPY     COLONOSCOPY WITH PROPOFOL  N/A 01/05/2017   Procedure: COLONOSCOPY WITH PROPOFOL ;  Surgeon: Therisa Bi, MD;  Location: [facility] ENDOSCOPY;  Service: Endoscopy;  Laterality: N/A;   COLONOSCOPY WITH PROPOFOL  N/A 02/13/2020   Procedure: COLONOSCOPY WITH PROPOFOL ;  Surgeon: Janalyn Keene NOVAK, MD;  Location: [facility] ENDOSCOPY;  Service: Endoscopy;  Laterality: N/A;   CORONARY ANGIOPLASTY WITH STENT PLACEMENT     CORONARY PRESSURE/FFR STUDY N/A 12/04/2019   Procedure: INTRAVASCULAR PRESSURE WIRE/FFR STUDY;  Surgeon: Wonda Sharper, MD;  Location: [facility] INVASIVE CV LAB;  Service: Cardiovascular;  Laterality: N/A;   ESOPHAGOGASTRODUODENOSCOPY (EGD) WITH PROPOFOL  N/A 01/05/2017   Procedure: ESOPHAGOGASTRODUODENOSCOPY (EGD) WITH PROPOFOL ;  Surgeon: Therisa Bi, MD;  Location: [facility] ENDOSCOPY;  Service: Endoscopy;  Laterality: N/A;   ESOPHAGOGASTRODUODENOSCOPY (EGD) WITH PROPOFOL  N/A 02/13/2020   Procedure: ESOPHAGOGASTRODUODENOSCOPY (EGD) WITH PROPOFOL ;  Surgeon: Janalyn Keene NOVAK, MD;  Location: [facility] ENDOSCOPY;  Service: Endoscopy;  Laterality: N/A;   KNEE ARTHROSCOPY WITH MEDIAL MENISECTOMY Right 09/05/2017   Procedure: KNEE ARTHROSCOPY WITH MEDIAL AND LATERAL  MENISECTOMY PARTIAL SYNOVECTOMY;  Surgeon: Kathlynn Sharper, MD;  Location: [facility] ORS;  Service: Orthopedics;  Laterality: Right;   LEFT HEART CATH AND CORONARY ANGIOGRAPHY N/A 12/04/2019   Procedure: LEFT HEART CATH AND CORONARY ANGIOGRAPHY;  Surgeon: Wonda Sharper, MD;  Location: [facility] INVASIVE CV LAB;  Service: Cardiovascular;  Laterality: N/A;   LEFT HEART CATH AND CORONARY ANGIOGRAPHY Left 11/25/2022   Procedure: LEFT HEART CATH AND CORONARY ANGIOGRAPHY;  Surgeon: Bathe Denyse DELENA, MD;  Location: [facility] INVASIVE CV LAB;  Service: Cardiovascular;  Laterality: Left;   REVERSE SHOULDER ARTHROPLASTY Right 08/31/2023   Procedure: RIGHT REVERSE SHOULDER ARTHROPLASTY;   Surgeon: [name], MD;  Location: [facility] OR;  Service: Orthopedics;  Laterality: Right;   SHOULDER SURGERY Right 04/09/2012   SPINE SURGERY     TOTAL KNEE ARTHROPLASTY Right 01/19/2021   Procedure: TOTAL KNEE ARTHROPLASTY - Medford Amber to Assist;  Surgeon: Kathlynn Sharper, MD;  Location: [facility] ORS;  Service: Orthopedics;  Laterality: Right;    Family History  Problem Relation Age of Onset   Osteoarthritis Mother    Heart disease Mother    Hypertension Mother    Depression Mother    Heart disease Father    Early death Father    Hypertension Father    Heart attack Father    Hypertension Sister    Parkinson's disease Maternal Grandfather    Prostate cancer Neg Hx    Bladder Cancer Neg Hx    Kidney cancer Neg Hx    Tremor Neg Hx     Social History   Socioeconomic History   Marital status: Widowed    Spouse name: Not on file   Number of children: Not on file   Years of education: Not on file   Highest education level: GED or equivalent  Occupational History   Occupation: disability   Occupation: stocking at gas station    Comment: 2 days per week  Tobacco Use   Smoking status: Every Day    Current packs/day: 1.50    Average packs/day: 1.5 packs/day for 42.5 years (63.7 ttl pk-yrs)    Types: Cigarettes    Start date: 10/26/1978    Last attempt to quit: 2000    Passive exposure: Current   Smokeless tobacco: Never   Tobacco comments:    Smokes 0.5 PPD - khj 03/26/2024        Started smoking at 63 yrs old    Smoked 1.5 PPD at his heaviest  Vaping Use   Vaping status: Never Used  Substance and Sexual Activity   Alcohol  use: Not Currently    Alcohol /week: 3.0 standard drinks of alcohol     Types: 3 Cans of beer per week    Comment: occassionally    Drug use: Not Currently    Types: Cocaine, Marijuana    Comment: last on saturday   Sexual activity: Not Currently    Partners: Female    Birth control/protection: None  Other Topics Concern   Not on file  Social  History Narrative   Not on file   Social Drivers of Health   Financial Resource Strain: High Risk (04/16/2024)   Overall Financial Resource Strain (CARDIA)    Difficulty of Paying Living Expenses: Hard  Food Insecurity: Food Insecurity Present (04/16/2024)   Hunger Vital Sign    Worried About Running Out of Food in the Last Year: Sometimes true    Ran Out of Food in the Last Year: Sometimes true  Transportation Needs: No Transportation Needs (04/16/2024)   PRAPARE - Administrator, Civil Service (Medical): No    Lack of Transportation (Non-Medical): No  Physical Activity: Inactive (04/16/2024)   Exercise Vital Sign    Days of Exercise per Week: 0 days    Minutes of Exercise per Session: Not on file  Stress: Stress Concern Present (04/16/2024)   [employer] of Occupational Health - Occupational Stress Questionnaire    Feeling of Stress: Rather much  Social Connections: Moderately Integrated (04/16/2024)   Social Connection and Isolation Panel    Frequency of Communication with Friends and Family: More than three times a week    Frequency of Social Gatherings with Friends and Family: More than three times a week    Attends Religious Services: More than 4 times per year    Active Member of [employer] or Organizations: Yes    Attends Banker Meetings: More than 4 times per year    Marital Status: Widowed    Allergies  Allergen Reactions   Asenapine Other (See Comments) and Nausea And Vomiting    Increased tremors   Dextromethorphan Hbr    Guaifenesin     Latuda [Lurasidone Hcl] Other (See Comments)    Tremors     Lurasidone Other (See Comments) and Hives    Other Reaction(s): Angioedema   Phenylephrine      Outpatient Medications Prior to Visit  Medication Sig Dispense Refill   acetaminophen  (TYLENOL ) 325 MG tablet Take 1 tablet (325 mg total) by mouth every 6 (six) hours as needed for mild pain (pain score 1-3) (or temp > 100.5). 60 tablet 0    albuterol  (VENTOLIN  HFA) 108 (90 Base) MCG/ACT inhaler Inhale 2 puffs into the lungs every 6 (six) hours as needed for wheezing or shortness of breath. 18 g 2  aspirin  EC 81 MG tablet Take 1 tablet (81 mg total) by mouth daily. Swallow whole. 30 tablet 0   atorvastatin  (LIPITOR ) 80 MG tablet TAKE 1 TABLET BY MOUTH EVERY DAY 90 tablet 3   budesonide -glycopyrrolate -formoterol  (BREZTRI  AEROSPHERE) 160-9-4.8 MCG/ACT AERO inhaler Inhale 2 puffs into the lungs in the morning and at bedtime. 1 each 2   celecoxib  (CELEBREX ) 100 MG capsule TAKE 2 CAPSULES BY MOUTH 2 TIMES DAILY. 60 capsule 1   divalproex  (DEPAKOTE ) 500 MG DR tablet Take half tablet in the morning and 3 tablets at bedtime 105 tablet 2   DULoxetine  (CYMBALTA ) 30 MG capsule Take 3 capsules (90 mg total) by mouth daily. (Patient taking differently: Take 30-60 mg by mouth See admin instructions. 30mg  qam, 60mg  qhs) 270 capsule 0   gabapentin  (NEURONTIN ) 600 MG tablet Take 1 tablet (600 mg total) by mouth daily AND 2 tablets (1,200 mg total) at bedtime. 90 tablet 1   hydrOXYzine  (ATARAX ) 25 MG tablet Take 1 tablet (25 mg total) by mouth 2 (two) times daily as needed. 270 tablet 0   isosorbide  mononitrate (IMDUR ) 30 MG 24 hr tablet TAKE 1 TABLET BY MOUTH 2 TIMES DAILY. 180 tablet 1   Multiple Vitamins-Minerals (MENS 50+ MULTIVITAMIN) TABS Take 1 tablet by mouth in the morning.     nicotine  (NICODERM CQ  - DOSED IN MG/24 HOURS) 21 mg/24hr patch Place 1 patch (21 mg total) onto the skin daily. 42 patch 0   nicotine  polacrilex (NICOTINE  MINI) 2 MG lozenge Take 1 lozenge (2 mg total) by mouth every 2 (two) hours as needed for smoking cessation. 72 lozenge 3   nitroGLYCERIN  (NITROSTAT ) 0.4 MG SL tablet Place 1 tablet (0.4 mg total) under the tongue every 5 (five) minutes as needed for chest pain. 30 tablet 0   pantoprazole  (PROTONIX ) 40 MG tablet Take 1 tablet (40 mg total) by mouth daily. (Patient taking differently: Take 40 mg by mouth in the morning.)  90 tablet 0   QUEtiapine  (SEROQUEL ) 200 MG tablet Take 1.5 tablets (300 mg total) by mouth at bedtime. 90 tablet 0   traZODone  (DESYREL ) 100 MG tablet Take 1 tablet (100 mg total) by mouth at bedtime. 90 tablet 0   predniSONE  (DELTASONE ) 10 MG tablet Takes 4 tablets by mouth for 1 day, then 3 tablets for 1 day, then 2 tablets for 1 day, then 1 tablet for 1 day, and then stop. (Patient not taking: Reported on 04/16/2024) 10 tablet 0   No facility-administered medications prior to visit.     ROS Review of Systems  Constitutional:  Positive for fatigue and fever. Negative for activity change and appetite change.  HENT:  Positive for rhinorrhea. Negative for sinus pressure and sore throat.   Respiratory:  Positive for cough, shortness of breath and wheezing. Negative for chest tightness.   Cardiovascular:  Negative for chest pain and palpitations.  Gastrointestinal:  Negative for abdominal distention, abdominal pain and constipation.  Genitourinary: Negative.   Musculoskeletal: Negative.   Neurological:  Positive for light-headedness and headaches.  Psychiatric/Behavioral:  Negative for behavioral problems and dysphoric mood.     Objective:  BP 112/67   Pulse (!) 53   Ht 6' 3 (1.905 m)   Wt 243 lb 6.4 oz (110.4 kg)   SpO2 97%   BMI 30.42 kg/m      04/16/2024    3:54 PM 04/05/2024   10:20 AM 03/26/2024   10:36 AM  BP/Weight  Systolic BP 112 117 108  Diastolic  BP 67 71 58  Wt. (Lbs) 243.4 236.8 241  BMI 30.42 kg/m2 29.6 kg/m2 30.12 kg/m2      Physical Exam Constitutional:      Appearance: He is well-developed.  HENT:     Head:     Comments: No tenderness to palpation in sinuses    Right Ear: Tympanic membrane normal.     Left Ear: Tympanic membrane normal.     Mouth/Throat:     Mouth: Mucous membranes are moist.     Pharynx: No posterior oropharyngeal erythema.  Cardiovascular:     Rate and Rhythm: Bradycardia present.     Heart sounds: Normal heart sounds. No murmur  heard. Pulmonary:     Effort: Pulmonary effort is normal.     Breath sounds: Wheezing present. No rales.  Chest:     Chest wall: No tenderness.  Abdominal:     General: Bowel sounds are normal. There is no distension.     Palpations: Abdomen is soft. There is no mass.     Tenderness: There is no abdominal tenderness.  Musculoskeletal:        General: Normal range of motion.     Right lower leg: No edema.     Left lower leg: No edema.  Neurological:     Mental Status: He is alert and oriented to person, place, and time.  Psychiatric:        Mood and Affect: Mood normal.        Latest Ref Rng & Units 04/05/2024   10:43 AM 03/22/2024    3:47 AM 03/21/2024    3:33 AM  CMP  Glucose 70 - 99 mg/dL 76  871  821   BUN 8 - 27 mg/dL 16  14  13    Creatinine 0.76 - 1.27 mg/dL 9.05  9.06  8.87   Sodium 134 - 144 mmol/L 138  137  133   Potassium 3.5 - 5.2 mmol/L 4.4  3.4  4.1   Chloride 96 - 106 mmol/L 101  103  102   CO2 20 - 29 mmol/L 23  24  23    Calcium  8.6 - 10.2 mg/dL 8.6  8.7  8.6   Total Protein 6.5 - 8.1 g/dL   5.9   Total Bilirubin 0.0 - 1.2 mg/dL   0.5   Alkaline Phos 38 - 126 U/L   48   AST 15 - 41 U/L   18   ALT 0 - 44 U/L   10     Lipid Panel     Component Value Date/Time   CHOL 99 (L) 02/12/2024 0840   TRIG 122 02/12/2024 0840   HDL 29 (L) 02/12/2024 0840   CHOLHDL 3.4 02/12/2024 0840   CHOLHDL 3.0 04/26/2022 1530   VLDL 10 04/26/2022 1530   LDLCALC 48 02/12/2024 0840    CBC    Component Value Date/Time   WBC 6.0 04/05/2024 1043   WBC 11.0 (H) 03/22/2024 0347   RBC 4.64 04/05/2024 1043   RBC 3.96 (L) 03/22/2024 0347   HGB 15.3 04/05/2024 1043   HCT 47.4 04/05/2024 1043   PLT 160 04/05/2024 1043   MCV 102 (H) 04/05/2024 1043   MCH 33.0 04/05/2024 1043   MCH 32.8 03/22/2024 0347   MCHC 32.3 04/05/2024 1043   MCHC 34.9 03/22/2024 0347   RDW 13.9 04/05/2024 1043   LYMPHSABS 2.0 02/13/2023 1031   MONOABS 0.5 08/03/2022 0950   EOSABS 0.3 02/13/2023 1031    BASOSABS 0.0 02/13/2023 1031  Lab Results  Component Value Date   HGBA1C 5.3 02/12/2024       Assessment & Plan COPD exacerbation Exacerbation likely due to acute upper respiratory infection. Current inhalers ineffective. -Oxygen saturation is stable - Prescribed Augmentin  for infection. - Prescribed prednisone  for inflammation. - Prescribed Tessalon  for cough. - Advised hydration and rest. - Instructed to inform pulmonary function test providers about current medications.   Bronchogenic carcinoma Currently under pulmonary care Scheduled for PFT tomorrow        Meds ordered this encounter  Medications   amoxicillin -clavulanate (AUGMENTIN ) 875-125 MG tablet    Sig: Take 1 tablet by mouth 2 (two) times daily.    Dispense:  20 tablet    Refill:  0   predniSONE  (DELTASONE ) 20 MG tablet    Sig: Take 1 tablet (20 mg total) by mouth daily with breakfast.    Dispense:  5 tablet    Refill:  0   benzonatate  (TESSALON ) 200 MG capsule    Sig: Take 1 capsule (200 mg total) by mouth 2 (two) times daily as needed for cough.    Dispense:  20 capsule    Refill:  0    Follow-up: Return for previously scheduled appointment, Medical conditions with PCP.       Corrina Sabin, MD, FAAFP. [facility] [place], [place] [phone]   04/16/2024, 5:58 PM

## 2024-04-17 ENCOUNTER — Ambulatory Visit: Payer: MEDICAID | Admitting: Student in an Organized Health Care Education/Training Program

## 2024-04-17 ENCOUNTER — Ambulatory Visit (INDEPENDENT_AMBULATORY_CARE_PROVIDER_SITE_OTHER): Payer: MEDICAID | Admitting: Student in an Organized Health Care Education/Training Program

## 2024-04-17 ENCOUNTER — Encounter: Payer: Self-pay | Admitting: Student in an Organized Health Care Education/Training Program

## 2024-04-17 ENCOUNTER — Ambulatory Visit (HOSPITAL_COMMUNITY): Payer: MEDICAID

## 2024-04-17 VITALS — BP 122/60 | HR 56 | Temp 97.5°F | Ht 75.0 in | Wt 244.0 lb

## 2024-04-17 DIAGNOSIS — F17210 Nicotine dependence, cigarettes, uncomplicated: Secondary | ICD-10-CM | POA: Diagnosis not present

## 2024-04-17 DIAGNOSIS — J441 Chronic obstructive pulmonary disease with (acute) exacerbation: Secondary | ICD-10-CM | POA: Diagnosis not present

## 2024-04-17 DIAGNOSIS — R911 Solitary pulmonary nodule: Secondary | ICD-10-CM | POA: Diagnosis not present

## 2024-04-17 DIAGNOSIS — F17200 Nicotine dependence, unspecified, uncomplicated: Secondary | ICD-10-CM

## 2024-04-17 DIAGNOSIS — J42 Unspecified chronic bronchitis: Secondary | ICD-10-CM

## 2024-04-17 LAB — PULMONARY FUNCTION TEST
DL/VA % pred: 106 %
DL/VA: 4.3 ml/min/mmHg/L
DLCO unc % pred: 83 %
DLCO unc: 26.03 ml/min/mmHg
FEF 25-75 Post: 3.62 L/s
FEF 25-75 Pre: 2.71 L/s
FEF2575-%Change-Post: 33 %
FEF2575-%Pred-Post: 109 %
FEF2575-%Pred-Pre: 82 %
FEV1-%Change-Post: 8 %
FEV1-%Pred-Post: 73 %
FEV1-%Pred-Pre: 67 %
FEV1-Post: 3.08 L
FEV1-Pre: 2.83 L
FEV1FVC-%Change-Post: -6 %
FEV1FVC-%Pred-Pre: 105 %
FEV6-%Change-Post: 17 %
FEV6-%Pred-Post: 78 %
FEV6-%Pred-Pre: 66 %
FEV6-Post: 4.15 L
FEV6-Pre: 3.55 L
FEV6FVC-%Change-Post: 0 %
FEV6FVC-%Pred-Post: 104 %
FEV6FVC-%Pred-Pre: 104 %
FVC-%Change-Post: 16 %
FVC-%Pred-Post: 75 %
FVC-%Pred-Pre: 64 %
FVC-Post: 4.17 L
FVC-Pre: 3.58 L
Post FEV1/FVC ratio: 74 %
Post FEV6/FVC ratio: 100 %
Pre FEV1/FVC ratio: 79 %
Pre FEV6/FVC Ratio: 100 %
RV % pred: 127 %
RV: 3.3 L
TLC % pred: 96 %
TLC: 7.75 L

## 2024-04-17 NOTE — Patient Instructions (Signed)
 Full PFT completed today ? ?

## 2024-04-17 NOTE — Progress Notes (Unsigned)
 Synopsis: Referred in *** by Isadora Hose, MD  Assessment & Plan:   1. Pulmonary nodule (Primary)  Patient presenting for the evaluation of a RUL pulmonary nodule that appears to be enlarging on serial CT's. I have personally reviewed his multiple CT scans and note the RUL nodule to have been present, albeit smaller, on a CT chest from 05/2023. On close review, there also appears to be significant emphysema and inflammatory changes in his lungs likely secondary to tobacco and previous illicit drug use.  He has had his PET/CT which shows the area in the right upper lobe to be FDG avid.  This nodule is highly concerning for malignancy given FDG avidity on PET as well as his risk factors.  Also the differential or informatory changes but the progression over serial CTs suggests against that and is more in favor of malignancy.  I have presented the patient's case in the multidisciplinary tumor board conference with input from radiology, oncology, and thoracic surgery.  I believe the best next step is to proceed with surgical resection (possibly wedge) and he has been referred to thoracic surgery.  He will be seen in a couple weeks for evaluation of resection.   The location of this nodule could make it amenable for a wedge resection, and I will consider discussing dye marking vs fiducial marker placement vs single anesthetic event procedure with thoracic surgery.  2. COPD with acute exacerbation (HCC)  He has a history of smoking and notable emphysema on CT scan.  He had his PFTs today which show impaired spirometry but preserved ratio (FEV1/FVC ratio of 0.79; FEV1 at 67% predicted; bronchodilator response noted).  His lung volumes are preserved but he does have signs of air trapping.  DLCO is similarly at the lower limit of normal.  He was recently seen by his PCP for COPD exacerbation and prescribed prednisone  and antibiotics.  He is maintained on ICS/LABA/LAMA by his outpatient provider which is  appropriate treatment for his condition.  -Continue Breztri  2 puffs twice daily -Finish course of prednisone  and antibiotics for COPD exacerbation   3. Tobacco use disorder  Has a longstanding history of smoking.  Counseled extensively during our initial visit regarding importance of smoking cessation and prescribe nicotine  patches and lozenges.  He has cut down significantly but continues to smoke a few cigarettes.  I stressed on him the importance of full smoking cessation so that he would be optimized from a surgical perspective.  Explained to him that surgeons would be quite hesitant to proceed with lung resection in active smokers.  He promised to fully abstain from smoking cigarettes.   Return in about 4 months (around 08/17/2024).  I spent *** minutes caring for this patient today, including {EM billing:[id number]}  Hose Isadora, MD [facility] 04/17/2024 2:01 PM    End of visit medications:  No orders of the defined types were placed in this encounter.    Current Outpatient Medications:    acetaminophen  (TYLENOL ) 325 MG tablet, Take 1 tablet (325 mg total) by mouth every 6 (six) hours as needed for mild pain (pain score 1-3) (or temp > 100.5)., Disp: 60 tablet, Rfl: 0   albuterol  (VENTOLIN  HFA) 108 (90 Base) MCG/ACT inhaler, Inhale 2 puffs into the lungs every 6 (six) hours as needed for wheezing or shortness of breath., Disp: 18 g, Rfl: 2   amoxicillin -clavulanate (AUGMENTIN ) 875-125 MG tablet, Take 1 tablet by mouth 2 (two) times daily., Disp: 20 tablet, Rfl: 0  aspirin  EC 81 MG tablet, Take 1 tablet (81 mg total) by mouth daily. Swallow whole., Disp: 30 tablet, Rfl: 0   atorvastatin  (LIPITOR ) 80 MG tablet, TAKE 1 TABLET BY MOUTH EVERY DAY, Disp: 90 tablet, Rfl: 3   budesonide -glycopyrrolate -formoterol  (BREZTRI  AEROSPHERE) 160-9-4.8 MCG/ACT AERO inhaler, Inhale 2 puffs into the lungs in the morning and at bedtime., Disp: 1 each, Rfl: 2   celecoxib  (CELEBREX )  100 MG capsule, TAKE 2 CAPSULES BY MOUTH 2 TIMES DAILY., Disp: 60 capsule, Rfl: 1   divalproex  (DEPAKOTE ) 500 MG DR tablet, Take half tablet in the morning and 3 tablets at bedtime, Disp: 105 tablet, Rfl: 2   DULoxetine  (CYMBALTA ) 30 MG capsule, Take 3 capsules (90 mg total) by mouth daily. (Patient taking differently: Take 30-60 mg by mouth See admin instructions. 30mg  qam, 60mg  qhs), Disp: 270 capsule, Rfl: 0   gabapentin  (NEURONTIN ) 600 MG tablet, Take 1 tablet (600 mg total) by mouth daily AND 2 tablets (1,200 mg total) at bedtime., Disp: 90 tablet, Rfl: 1   hydrOXYzine  (ATARAX ) 25 MG tablet, Take 1 tablet (25 mg total) by mouth 2 (two) times daily as needed., Disp: 270 tablet, Rfl: 0   isosorbide  mononitrate (IMDUR ) 30 MG 24 hr tablet, TAKE 1 TABLET BY MOUTH 2 TIMES DAILY., Disp: 180 tablet, Rfl: 1   Multiple Vitamins-Minerals (MENS 50+ MULTIVITAMIN) TABS, Take 1 tablet by mouth in the morning., Disp: , Rfl:    nicotine  (NICODERM CQ  - DOSED IN MG/24 HOURS) 21 mg/24hr patch, Place 1 patch (21 mg total) onto the skin daily., Disp: 42 patch, Rfl: 0   nicotine  polacrilex (NICOTINE  MINI) 2 MG lozenge, Take 1 lozenge (2 mg total) by mouth every 2 (two) hours as needed for smoking cessation., Disp: 72 lozenge, Rfl: 3   nitroGLYCERIN  (NITROSTAT ) 0.4 MG SL tablet, Place 1 tablet (0.4 mg total) under the tongue every 5 (five) minutes as needed for chest pain., Disp: 30 tablet, Rfl: 0   pantoprazole  (PROTONIX ) 40 MG tablet, Take 1 tablet (40 mg total) by mouth daily. (Patient taking differently: Take 40 mg by mouth in the morning.), Disp: 90 tablet, Rfl: 0   predniSONE  (DELTASONE ) 20 MG tablet, Take 1 tablet (20 mg total) by mouth daily with breakfast., Disp: 5 tablet, Rfl: 0   QUEtiapine  (SEROQUEL ) 200 MG tablet, Take 1.5 tablets (300 mg total) by mouth at bedtime., Disp: 90 tablet, Rfl: 0   traZODone  (DESYREL ) 100 MG tablet, Take 1 tablet (100 mg total) by mouth at bedtime., Disp: 90 tablet, Rfl: 0    benzonatate  (TESSALON ) 200 MG capsule, Take 1 capsule (200 mg total) by mouth 2 (two) times daily as needed for cough. (Patient not taking: Reported on 04/17/2024), Disp: 20 capsule, Rfl: 0   Subjective:   PATIENT ID: Keith Gibbs GENDER: male DOB: 08/24/1960, MRN: [id number]  Chief Complaint  Patient presents with   Medical Management of Chronic Issues    No Sob or wheezing. Cough with yellow sputum.    HPI  Patient is a pleasant 63 year old male presenting for follow up of pulmonary nodule.  Initial Visit 03/26/2024:  Patient reports no major increase in his shortness of breath or change in respiratory symptoms but he does have those at baseline.  He reports some wheezing as well as a cough that is at times productive of yellow and dark brown sputum.  This has not increased recently.  He has not had any recent exacerbations of his breathing.  He did receive a course of  prednisone  in August for his back pain.  He is maintained on ICS/LABA/LAMA with Breztri .   He has been enrolled in the lung cancer screening program and has had multiple CT scans.  His most recent CT scan from August 2025 showed growth in the right upper lobe subpleural pulmonary nodule prompting referral to nodule clinic.  Return Visit 04/17/2024:  Presents for follow-up today and has had a recent COPD exacerbation and seen by his PCP.  He was prescribed prednisone  and antibiotics.  He feels improved but continues to have some respiratory symptoms.  He is having increased cough and some wheezing.  He has cut down significantly on his smoking but he still smokes a few cigarettes a day.   Patient does report a significant smoking history, having started at the age of 35.  He currently smokes a few cigarettes a day.  He smoked 1.5 packs/day at his max.  He probably has around 50 pack years of smoking history.  Patient also has a history of illicit drug use (crack cocaine).   CT Chest 02/29/2024:   1. Enlarging spiculated  right upper lobe nodule, highly worrisome for primary bronchogenic carcinoma. Lung-RADS 4X, highly suspicious. Additional imaging evaluation or consultation with Pulmonology or Thoracic Surgery recommended. These results will be called to the ordering clinician or representative by the Radiologist Assistant, and communication documented in the PACS or [employer]. 2. Age advanced two vessel coronary artery calcification. 3. 4.0 cm ascending aortic aneurysm, stable.  PET/CT 04/01/2024  IMPRESSION: 1. 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 2. Hypermetabolic small left level II lymph node is nonspecific. Recommend attention on follow-up. 3. Aortic atherosclerosis (ICD10-I70.0). Coronary artery calcification. 4.  Emphysema (ICD10-J43.9).  Ancillary information including prior medications, full medical/surgical/family/social histories, and PFTs (when available) are listed below and have been reviewed.    Review of Systems  Constitutional:  Negative for chills, fever and weight loss.  Respiratory:  Positive for cough and wheezing. Negative for hemoptysis, sputum production and shortness of breath.   Cardiovascular:  Negative for chest pain.     Objective:   Vitals:   04/17/24 1344  BP: 122/60  Pulse: (!) 56  Temp: (!) 97.5 F (36.4 C)  SpO2: 96%  Weight: 244 lb (110.7 kg)  Height: 6' 3 (1.905 m)   96% on RA BMI Readings from Last 3 Encounters:  04/17/24 30.50 kg/m  04/17/24 30.50 kg/m  04/16/24 30.42 kg/m   Wt Readings from Last 3 Encounters:  04/17/24 244 lb (110.7 kg)  04/17/24 244 lb (110.7 kg)  04/16/24 243 lb 6.4 oz (110.4 kg)    Physical Exam Constitutional:      Appearance: Normal appearance.  Cardiovascular:     Rate and Rhythm: Normal rate and regular rhythm.     Pulses: Normal pulses.     Heart sounds: Normal heart sounds.  Pulmonary:     Effort: Pulmonary effort is normal.      Breath sounds: Wheezing and rhonchi present.  Neurological:     General: No focal deficit present.     Mental Status: He is alert and oriented to person, place, and time. Mental status is at baseline.       Ancillary Information    Past Medical History:  Diagnosis Date   Anginal pain    Anxiety    Arthritis    Bipolar 1 disorder (HCC)    Bursitis    CAD (coronary artery disease)    Chronic  pain    COPD (chronic obstructive pulmonary disease) (HCC)    Current use of long term anticoagulation    DAPT (ASA + clopidogrel )   Depression    Diverticulitis    Dyspnea    GERD (gastroesophageal reflux disease)    Grade I diastolic dysfunction    Hepatitis C 2012   No longer has Hep C   HLD (hyperlipidemia)    Hypertension    MI (myocardial infarction) (HCC)    Polysubstance abuse (HCC)    cocaine, marijuana, ETOH   PUD (peptic ulcer disease)    S/P angioplasty with stent 06/10/2016   a.) 90% stenosis of pLAD to mLAD - 2.5 x 18 mm Xience Alpine (DES x 1) placed to pLAD   S/P PTCA (percutaneous transluminal coronary angioplasty) 12/04/2019   a.) 60% in stent restenosis of DES to pLAD; LVEF 65%.   Schizophrenia (HCC)    Stroke [facility])    Valvular insufficiency    a.) Mild MR, TR, PR; mild to moderate AR on 03/05/2018 TTE     Family History  Problem Relation Age of Onset   Osteoarthritis Mother    Heart disease Mother    Hypertension Mother    Depression Mother    Heart disease Father    Early death Father    Hypertension Father    Heart attack Father    Hypertension Sister    Parkinson's disease Maternal Grandfather    Prostate cancer Neg Hx    Bladder Cancer Neg Hx    Kidney cancer Neg Hx    Tremor Neg Hx      Past Surgical History:  Procedure Laterality Date   ABDOMINAL SURGERY     removed small piece of intestines due to [facility] Diverticulosis   ANTERIOR LAT LUMBAR FUSION N/A 08/23/2022   Procedure: DIRECT LATERAL INTERBODY FUSION  LUMBAR TWO- LUMBAR THREE,  LUMBAR THREE-LUMBAR FOUR , EXPLORE AND EXTEND FUSION LUMBAR TWO-LUMBAR FIVE, POSTERIOR DECOMPRESSION LUMBAR THREE-LUMBAR FOUR, LEFT LUMBAR TWO-LUMBAR THREE;  Surgeon: Debby Dorn MATSU, MD;  Location: [facility] OR;  Service: Neurosurgery;  Laterality: N/A;   APPENDECTOMY     BACK SURGERY     CARDIAC CATHETERIZATION Left 06/10/2016   Procedure: Left Heart Cath and Coronary Angiography;  Surgeon: Denyse DELENA Bathe, MD;  Location: [facility] INVASIVE CV LAB;  Service: Cardiovascular;  Laterality: Left;   CARDIAC CATHETERIZATION N/A 06/10/2016   Procedure: Coronary Stent Intervention;  Surgeon: Cara JONETTA Lovelace, MD;  Location: [facility] INVASIVE CV LAB;  Service: Cardiovascular;  Laterality: N/A;   CHOLECYSTECTOMY N/A 02/17/2022   Procedure: LAPAROSCOPIC CHOLECYSTECTOMY;  Surgeon: Stevie Herlene Righter, MD;  Location: [facility] OR;  Service: General;  Laterality: N/A;   COLON SURGERY     COLONOSCOPY     COLONOSCOPY WITH PROPOFOL  N/A 01/05/2017   Procedure: COLONOSCOPY WITH PROPOFOL ;  Surgeon: Therisa Bi, MD;  Location: [facility] ENDOSCOPY;  Service: Endoscopy;  Laterality: N/A;   COLONOSCOPY WITH PROPOFOL  N/A 02/13/2020   Procedure: COLONOSCOPY WITH PROPOFOL ;  Surgeon: Janalyn Keene NOVAK, MD;  Location: [facility] ENDOSCOPY;  Service: Endoscopy;  Laterality: N/A;   CORONARY ANGIOPLASTY WITH STENT PLACEMENT     CORONARY PRESSURE/FFR STUDY N/A 12/04/2019   Procedure: INTRAVASCULAR PRESSURE WIRE/FFR STUDY;  Surgeon: Wonda Sharper, MD;  Location[facility] INVASIVE CV LAB;  Service: Cardiovascular;  Laterality: N/A;   ESOPHAGOGASTRODUODENOSCOPY (EGD) WITH PROPOFOL  N/A 01/05/2017   Procedure: ESOPHAGOGASTRODUODENOSCOPY (EGD) WITH PROPOFOL ;  Surgeon: Therisa Bi, MD;  Location: [facility] ENDOSCOPY;  Service: Endoscopy;  Laterality: N/A;   ESOPHAGOGASTRODUODENOSCOPY (EGD)  WITH PROPOFOL  N/A 02/13/2020   Procedure: ESOPHAGOGASTRODUODENOSCOPY (EGD) WITH PROPOFOL ;  Surgeon: Janalyn Keene NOVAK, MD;  Location: [facility] ENDOSCOPY;  Service: Endoscopy;   Laterality: N/A;   KNEE ARTHROSCOPY WITH MEDIAL MENISECTOMY Right 09/05/2017   Procedure: KNEE ARTHROSCOPY WITH MEDIAL AND LATERAL  MENISECTOMY PARTIAL SYNOVECTOMY;  Surgeon: Kathlynn Sharper, MD;  Location: [facility] ORS;  Service: Orthopedics;  Laterality: Right;   LEFT HEART CATH AND CORONARY ANGIOGRAPHY N/A 12/04/2019   Procedure: LEFT HEART CATH AND CORONARY ANGIOGRAPHY;  Surgeon: Wonda Sharper, MD;  Location: [facility] INVASIVE CV LAB;  Service: Cardiovascular;  Laterality: N/A;   LEFT HEART CATH AND CORONARY ANGIOGRAPHY Left 11/25/2022   Procedure: LEFT HEART CATH AND CORONARY ANGIOGRAPHY;  Surgeon: Fernand Denyse LABOR, MD;  Location: [facility] INVASIVE CV LAB;  Service: Cardiovascular;  Laterality: Left;   REVERSE SHOULDER ARTHROPLASTY Right 08/31/2023   Procedure: RIGHT REVERSE SHOULDER ARTHROPLASTY;  Surgeon: [name], MD;  Location: [facility] OR;  Service: Orthopedics;  Laterality: Right;   SHOULDER SURGERY Right 04/09/2012   SPINE SURGERY     TOTAL KNEE ARTHROPLASTY Right 01/19/2021   Procedure: TOTAL KNEE ARTHROPLASTY - [place] Amber to Assist;  Surgeon: Kathlynn Sharper, MD;  Location: [facility] ORS;  Service: Orthopedics;  Laterality: Right;    Social History   Socioeconomic History   Marital status: Widowed    Spouse name: Not on file   Number of children: Not on file   Years of education: Not on file   Highest education level: GED or equivalent  Occupational History   Occupation: disability   Occupation: stocking at gas station    Comment: 2 days per week  Tobacco Use   Smoking status: Every Day    Current packs/day: 1.50    Average packs/day: 1.5 packs/day for 42.5 years (63.7 ttl pk-yrs)    Types: Cigarettes    Start date: 10/26/1978    Last attempt to quit: 2000    Passive exposure: Current   Smokeless tobacco: Never   Tobacco comments:    Smokes 0.5 PPD - khj 04/17/2024        Started smoking at 63 yrs old    Smoked 1.5 PPD at his heaviest  Vaping Use   Vaping status: Never Used   Substance and Sexual Activity   Alcohol  use: Not Currently    Alcohol /week: 3.0 standard drinks of alcohol     Types: 3 Cans of beer per week    Comment: occassionally    Drug use: Not Currently    Types: Cocaine, Marijuana    Comment: last on saturday   Sexual activity: Not Currently    Partners: Female    Birth control/protection: None  Other Topics Concern   Not on file  Social History Narrative   Not on file   Social Drivers of Health   Financial Resource Strain: High Risk (04/16/2024)   Overall Financial Resource Strain (CARDIA)    Difficulty of Paying Living Expenses: Hard  Food Insecurity: Food Insecurity Present (04/16/2024)   Hunger Vital Sign    Worried About Running Out of Food in the Last Year: Sometimes true    Ran Out of Food in the Last Year: Sometimes true  Transportation Needs: No Transportation Needs (04/16/2024)   PRAPARE - Administrator, Civil Service (Medical): No    Lack of Transportation (Non-Medical): No  Physical Activity: Inactive (04/16/2024)   Exercise Vital Sign    Days of Exercise per Week: 0 days    Minutes of Exercise per Session:  Not on file  Stress: Stress Concern Present (04/16/2024)   [employer] of Occupational Health - Occupational Stress Questionnaire    Feeling of Stress: Rather much  Social Connections: Moderately Integrated (04/16/2024)   Social Connection and Isolation Panel    Frequency of Communication with Friends and Family: More than three times a week    Frequency of Social Gatherings with Friends and Family: More than three times a week    Attends Religious Services: More than 4 times per year    Active Member of [employer] or Organizations: Yes    Attends Banker Meetings: More than 4 times per year    Marital Status: Widowed  Intimate Partner Violence: Not At Risk (03/27/2024)   Humiliation, Afraid, Rape, and Kick questionnaire    Fear of Current or Ex-Partner: No    Emotionally Abused: No     Physically Abused: No    Sexually Abused: No     Allergies  Allergen Reactions   Asenapine Other (See Comments) and Nausea And Vomiting    Increased tremors   Dextromethorphan Hbr    Guaifenesin     Latuda [Lurasidone Hcl] Other (See Comments)    Tremors     Lurasidone Other (See Comments) and Hives    Other Reaction(s): Angioedema   Phenylephrine       CBC    Component Value Date/Time   WBC 6.0 04/05/2024 1043   WBC 11.0 (H) 03/22/2024 0347   RBC 4.64 04/05/2024 1043   RBC 3.96 (L) 03/22/2024 0347   HGB 15.3 04/05/2024 1043   HCT 47.4 04/05/2024 1043   PLT 160 04/05/2024 1043   MCV 102 (H) 04/05/2024 1043   MCH 33.0 04/05/2024 1043   MCH 32.8 03/22/2024 0347   MCHC 32.3 04/05/2024 1043   MCHC 34.9 03/22/2024 0347   RDW 13.9 04/05/2024 1043   LYMPHSABS 2.0 02/13/2023 1031   MONOABS 0.5 08/03/2022 0950   EOSABS 0.3 02/13/2023 1031   BASOSABS 0.0 02/13/2023 1031    Pulmonary Functions Testing Results:    Latest Ref Rng & Units 04/17/2024   12:34 PM  PFT Results  FVC-Pre L 3.58  P  FVC-Predicted Pre % 64  P  FVC-Post L 4.17  P  FVC-Predicted Post % 75  P  Pre FEV1/FVC % % 79  P  Post FEV1/FCV % % 74  P  FEV1-Pre L 2.83  P  FEV1-Predicted Pre % 67  P  FEV1-Post L 3.08  P  DLCO uncorrected ml/min/mmHg 26.03  P  DLCO UNC% % 83  P  DLVA Predicted % 106  P  TLC L 7.75  P  TLC % Predicted % 96  P  RV % Predicted % 127  P    P Preliminary result    Outpatient Medications Prior to Visit  Medication Sig Dispense Refill   acetaminophen  (TYLENOL ) 325 MG tablet Take 1 tablet (325 mg total) by mouth every 6 (six) hours as needed for mild pain (pain score 1-3) (or temp > 100.5). 60 tablet 0   albuterol  (VENTOLIN  HFA) 108 (90 Base) MCG/ACT inhaler Inhale 2 puffs into the lungs every 6 (six) hours as needed for wheezing or shortness of breath. 18 g 2   amoxicillin -clavulanate (AUGMENTIN ) 875-125 MG tablet Take 1 tablet by mouth 2 (two) times daily. 20 tablet 0    aspirin  EC 81 MG tablet Take 1 tablet (81 mg total) by mouth daily. Swallow whole. 30 tablet 0   atorvastatin  (LIPITOR ) 80 MG tablet TAKE  1 TABLET BY MOUTH EVERY DAY 90 tablet 3   budesonide -glycopyrrolate -formoterol  (BREZTRI  AEROSPHERE) 160-9-4.8 MCG/ACT AERO inhaler Inhale 2 puffs into the lungs in the morning and at bedtime. 1 each 2   celecoxib  (CELEBREX ) 100 MG capsule TAKE 2 CAPSULES BY MOUTH 2 TIMES DAILY. 60 capsule 1   divalproex  (DEPAKOTE ) 500 MG DR tablet Take half tablet in the morning and 3 tablets at bedtime 105 tablet 2   DULoxetine  (CYMBALTA ) 30 MG capsule Take 3 capsules (90 mg total) by mouth daily. (Patient taking differently: Take 30-60 mg by mouth See admin instructions. 30mg  qam, 60mg  qhs) 270 capsule 0   gabapentin  (NEURONTIN ) 600 MG tablet Take 1 tablet (600 mg total) by mouth daily AND 2 tablets (1,200 mg total) at bedtime. 90 tablet 1   hydrOXYzine  (ATARAX ) 25 MG tablet Take 1 tablet (25 mg total) by mouth 2 (two) times daily as needed. 270 tablet 0   isosorbide  mononitrate (IMDUR ) 30 MG 24 hr tablet TAKE 1 TABLET BY MOUTH 2 TIMES DAILY. 180 tablet 1   Multiple Vitamins-Minerals (MENS 50+ MULTIVITAMIN) TABS Take 1 tablet by mouth in the morning.     nicotine  (NICODERM CQ  - DOSED IN MG/24 HOURS) 21 mg/24hr patch Place 1 patch (21 mg total) onto the skin daily. 42 patch 0   nicotine  polacrilex (NICOTINE  MINI) 2 MG lozenge Take 1 lozenge (2 mg total) by mouth every 2 (two) hours as needed for smoking cessation. 72 lozenge 3   nitroGLYCERIN  (NITROSTAT ) 0.4 MG SL tablet Place 1 tablet (0.4 mg total) under the tongue every 5 (five) minutes as needed for chest pain. 30 tablet 0   pantoprazole  (PROTONIX ) 40 MG tablet Take 1 tablet (40 mg total) by mouth daily. (Patient taking differently: Take 40 mg by mouth in the morning.) 90 tablet 0   predniSONE  (DELTASONE ) 20 MG tablet Take 1 tablet (20 mg total) by mouth daily with breakfast. 5 tablet 0   QUEtiapine  (SEROQUEL ) 200 MG tablet  Take 1.5 tablets (300 mg total) by mouth at bedtime. 90 tablet 0   traZODone  (DESYREL ) 100 MG tablet Take 1 tablet (100 mg total) by mouth at bedtime. 90 tablet 0   benzonatate  (TESSALON ) 200 MG capsule Take 1 capsule (200 mg total) by mouth 2 (two) times daily as needed for cough. (Patient not taking: Reported on 04/17/2024) 20 capsule 0   No facility-administered medications prior to visit.

## 2024-04-17 NOTE — Progress Notes (Signed)
 Full PFT completed today ? ?

## 2024-04-18 ENCOUNTER — Other Ambulatory Visit: Payer: Self-pay | Admitting: Orthopedic Surgery

## 2024-04-18 ENCOUNTER — Encounter (HOSPITAL_COMMUNITY): Payer: MEDICAID | Admitting: Psychiatry

## 2024-04-19 ENCOUNTER — Encounter (HOSPITAL_COMMUNITY): Payer: MEDICAID | Admitting: Psychiatry

## 2024-04-19 ENCOUNTER — Ambulatory Visit: Payer: MEDICAID | Admitting: Family

## 2024-04-19 ENCOUNTER — Ambulatory Visit (HOSPITAL_COMMUNITY): Payer: MEDICAID

## 2024-04-22 ENCOUNTER — Ambulatory Visit (INDEPENDENT_AMBULATORY_CARE_PROVIDER_SITE_OTHER): Payer: MEDICAID | Admitting: Licensed Clinical Social Worker

## 2024-04-22 DIAGNOSIS — F1111 Opioid abuse, in remission: Secondary | ICD-10-CM

## 2024-04-22 DIAGNOSIS — F17210 Nicotine dependence, cigarettes, uncomplicated: Secondary | ICD-10-CM

## 2024-04-22 DIAGNOSIS — F1290 Cannabis use, unspecified, uncomplicated: Secondary | ICD-10-CM

## 2024-04-22 DIAGNOSIS — F250 Schizoaffective disorder, bipolar type: Secondary | ICD-10-CM

## 2024-04-22 DIAGNOSIS — F411 Generalized anxiety disorder: Secondary | ICD-10-CM

## 2024-04-22 DIAGNOSIS — F1420 Cocaine dependence, uncomplicated: Secondary | ICD-10-CM | POA: Diagnosis not present

## 2024-04-22 DIAGNOSIS — F4310 Post-traumatic stress disorder, unspecified: Secondary | ICD-10-CM

## 2024-04-22 NOTE — Progress Notes (Deleted)
 BH MD/PA/NP OP Progress Note  04/22/2024 8:48 AM Keith Gibbs  MRN:  [id number]  Assessment and Plan:  Patient seen today in person. In the previous appointment, Seroquel  was increased to 300 mg. ***  Schizoaffective disorder, bipolar type PTSD Chronic pain -Increase Seroquel  to 300 mg nightly  -labs updated 01/2024 - Continue Depakote  250mg  daily and 1500mg  at bedtime - Continue Cymbalta  90 mg daily - Continue gabapentin  600 mg daily and 1200 mg nightly - Continue trazodone  100 mg nightly - Continue hydroxyzine  25 mg 2 times daily as needed  --VPA level: 69  and CMP : WNL  Stimulant use d/o Etoh use d/o Cannabis use disorder, remission - Referral to CDIOP done  Tobacco use disorder - continue to monitor   Chief Complaint:  No chief complaint on file.  Identifying Information: Keith Gibbs is a 63 year old male with a past psychiatric history of schizoaffective, cocaine use disorder, alcohol  use disorder; numerous hospitalizations due to substance intoxication/withdrawal, suicidal thoughts/attempts, mania, psychosis; numerous suicide attempts most recent required medical and psychiatric hospitalization in 07/2022 and a PMH of COPD, CVA, CAD, GERD, HTN, arthritis, and chronic pain.   Subjective: Patient seen ***.  Patient reports feeling *** today. Since the previous visit, ***. Stressors include ***.   Regarding psychiatric symptoms, ***. Patient reports the medications are ***. Patient reports the following adverse effects: ***.   Patient reports *** sleep, ***. Patient reports *** appetite, ***.   Patient denies current SI, HI, and AVH. ***  Substance use: ***  Visit Diagnosis:  No diagnosis found.    Past Psychiatric History: Patient endorses being hospitalized 3 times.  Patient reports the first time he was hospitalized in his 30s and the last time was a 1-2 years ago at [facility].  Reports that he was at [facility] he stayed approximately 30 days.  Patient  reports that all 3 hospitalizations were due to suicide attempts.  Patient reports he has tried to hang himself (the belt broke), slit his wrist, OD.   Substance use:  Cocaine- since 22 years ago most recently one week, frequency - once every 3 months  Alcohol - once/3 weeks- two 40 oz beers at a time Smoking- now smoking 1 ppd  Family Psychiatric History: Sister: Depression, paternal grandma-depression and anxiety, multiple maternal family members: EtOH use disorder, paternal aunt: schizophrenia was hospitalized, another paternal aunt: depression and anxiety   Social History:  Living: live with mother in [place] Occupation: retired Relationship: widowed Children: yes 3 - ages 27 and 10 Support: religion Legal History: numerous times in prison- decades ago  Past Medical History:  Past Medical History:  Diagnosis Date   Anginal pain    Anxiety    Arthritis    Bipolar 1 disorder (HCC)    Bursitis    CAD (coronary artery disease)    Chronic pain    COPD (chronic obstructive pulmonary disease) (HCC)    Current use of long term anticoagulation    DAPT (ASA + clopidogrel )   Depression    Diverticulitis    Dyspnea    GERD (gastroesophageal reflux disease)    Grade I diastolic dysfunction    Hepatitis C 2012   No longer has Hep C   HLD (hyperlipidemia)    Hypertension    MI (myocardial infarction) (HCC)    Polysubstance abuse (HCC)    cocaine, marijuana, ETOH   PUD (peptic ulcer disease)    S/P angioplasty with stent 06/10/2016   a.) 90% stenosis of pLAD to mLAD -  2.5 x 18 mm Xience Alpine (DES x 1) placed to pLAD   S/P PTCA (percutaneous transluminal coronary angioplasty) 12/04/2019   a.) 60% in stent restenosis of DES to pLAD; LVEF 65%.   Schizophrenia (HCC)    Stroke [facility])    Valvular insufficiency    a.) Mild MR, TR, PR; mild to moderate AR on 03/05/2018 TTE    Past Surgical History:  Procedure Laterality Date   ABDOMINAL SURGERY     removed small piece of intestines due  to [facility] Diverticulosis   ANTERIOR LAT LUMBAR FUSION N/A 08/23/2022   Procedure: DIRECT LATERAL INTERBODY FUSION  LUMBAR TWO- LUMBAR THREE, LUMBAR THREE-LUMBAR FOUR , EXPLORE AND EXTEND FUSION LUMBAR TWO-LUMBAR FIVE, POSTERIOR DECOMPRESSION LUMBAR THREE-LUMBAR FOUR, LEFT LUMBAR TWO-LUMBAR THREE;  Surgeon: Debby Dorn MATSU, MD;  Location: [facility] OR;  Service: Neurosurgery;  Laterality: N/A;   APPENDECTOMY     BACK SURGERY     CARDIAC CATHETERIZATION Left 06/10/2016   Procedure: Left Heart Cath and Coronary Angiography;  Surgeon: Denyse DELENA Bathe, MD;  Location: [facility] INVASIVE CV LAB;  Service: Cardiovascular;  Laterality: Left;   CARDIAC CATHETERIZATION N/A 06/10/2016   Procedure: Coronary Stent Intervention;  Surgeon: Cara JONETTA Lovelace, MD;  Location: [facility] INVASIVE CV LAB;  Service: Cardiovascular;  Laterality: N/A;   CHOLECYSTECTOMY N/A 02/17/2022   Procedure: LAPAROSCOPIC CHOLECYSTECTOMY;  Surgeon: Stevie Herlene Righter, MD;  Location: [facility] OR;  Service: General;  Laterality: N/A;   COLON SURGERY     COLONOSCOPY     COLONOSCOPY WITH PROPOFOL  N/A 01/05/2017   Procedure: COLONOSCOPY WITH PROPOFOL ;  Surgeon: Therisa Bi, MD;  Location: [facility] ENDOSCOPY;  Service: Endoscopy;  Laterality: N/A;   COLONOSCOPY WITH PROPOFOL  N/A 02/13/2020   Procedure: COLONOSCOPY WITH PROPOFOL ;  Surgeon: Janalyn Keene NOVAK, MD;  Location: [facility] ENDOSCOPY;  Service: Endoscopy;  Laterality: N/A;   CORONARY ANGIOPLASTY WITH STENT PLACEMENT     CORONARY PRESSURE/FFR STUDY N/A 12/04/2019   Procedure: INTRAVASCULAR PRESSURE WIRE/FFR STUDY;  Surgeon: Wonda Sharper, MD;  Location: [facility] INVASIVE CV LAB;  Service: Cardiovascular;  Laterality: N/A;   ESOPHAGOGASTRODUODENOSCOPY (EGD) WITH PROPOFOL  N/A 01/05/2017   Procedure: ESOPHAGOGASTRODUODENOSCOPY (EGD) WITH PROPOFOL ;  Surgeon: Therisa Bi, MD;  Location: [facility] ENDOSCOPY;  Service: Endoscopy;  Laterality: N/A;   ESOPHAGOGASTRODUODENOSCOPY (EGD) WITH PROPOFOL  N/A 02/13/2020    Procedure: ESOPHAGOGASTRODUODENOSCOPY (EGD) WITH PROPOFOL ;  Surgeon: Janalyn Keene NOVAK, MD;  Location: [facility] ENDOSCOPY;  Service: Endoscopy;  Laterality: N/A;   KNEE ARTHROSCOPY WITH MEDIAL MENISECTOMY Right 09/05/2017   Procedure: KNEE ARTHROSCOPY WITH MEDIAL AND LATERAL  MENISECTOMY PARTIAL SYNOVECTOMY;  Surgeon: Kathlynn Sharper, MD;  Location: [facility] ORS;  Service: Orthopedics;  Laterality: Right;   LEFT HEART CATH AND CORONARY ANGIOGRAPHY N/A 12/04/2019   Procedure: LEFT HEART CATH AND CORONARY ANGIOGRAPHY;  Surgeon: Wonda Sharper, MD;  Location: [facility] INVASIVE CV LAB;  Service: Cardiovascular;  Laterality: N/A;   LEFT HEART CATH AND CORONARY ANGIOGRAPHY Left 11/25/2022   Procedure: LEFT HEART CATH AND CORONARY ANGIOGRAPHY;  Surgeon: Bathe Denyse DELENA, MD;  Location: [facility] INVASIVE CV LAB;  Service: Cardiovascular;  Laterality: Left;   REVERSE SHOULDER ARTHROPLASTY Right 08/31/2023   Procedure: RIGHT REVERSE SHOULDER ARTHROPLASTY;  Surgeon: [name], MD;  Location: [facility] OR;  Service: Orthopedics;  Laterality: Right;   SHOULDER SURGERY Right 04/09/2012   SPINE SURGERY     TOTAL KNEE ARTHROPLASTY Right 01/19/2021   Procedure: TOTAL KNEE ARTHROPLASTY - Medford Amber to Assist;  Surgeon: Kathlynn Sharper, MD;  Location: [facility] ORS;  Service: Orthopedics;  Laterality: Right;    Family History:  Family History  Problem Relation Age of Onset   Osteoarthritis Mother    Heart disease Mother    Hypertension Mother    Depression Mother    Heart disease Father    Early death Father    Hypertension Father    Heart attack Father    Hypertension Sister    Parkinson's disease Maternal Grandfather    Prostate cancer Neg Hx    Bladder Cancer Neg Hx    Kidney cancer Neg Hx    Tremor Neg Hx     Social History   Socioeconomic History   Marital status: Widowed    Spouse name: Not on file   Number of children: Not on file   Years of education: Not on file   Highest education level: GED or equivalent   Occupational History   Occupation: disability   Occupation: stocking at gas station    Comment: 2 days per week  Tobacco Use   Smoking status: Every Day    Current packs/day: 1.50    Average packs/day: 1.5 packs/day for 42.5 years (63.7 ttl pk-yrs)    Types: Cigarettes    Start date: 10/26/1978    Last attempt to quit: 2000    Passive exposure: Current   Smokeless tobacco: Never   Tobacco comments:    Smokes 0.5 PPD - khj 04/17/2024        Started smoking at 63 yrs old    Smoked 1.5 PPD at his heaviest  Vaping Use   Vaping status: Never Used  Substance and Sexual Activity   Alcohol  use: Not Currently    Alcohol /week: 3.0 standard drinks of alcohol     Types: 3 Cans of beer per week    Comment: occassionally    Drug use: Not Currently    Types: Cocaine, Marijuana    Comment: last on saturday   Sexual activity: Not Currently    Partners: Female    Birth control/protection: None  Other Topics Concern   Not on file  Social History Narrative   Not on file   Social Drivers of Health   Financial Resource Strain: High Risk (04/16/2024)   Overall Financial Resource Strain (CARDIA)    Difficulty of Paying Living Expenses: Hard  Food Insecurity: Food Insecurity Present (04/16/2024)   Hunger Vital Sign    Worried About Running Out of Food in the Last Year: Sometimes true    Ran Out of Food in the Last Year: Sometimes true  Transportation Needs: No Transportation Needs (04/16/2024)   PRAPARE - Administrator, Civil Service (Medical): No    Lack of Transportation (Non-Medical): No  Physical Activity: Inactive (04/16/2024)   Exercise Vital Sign    Days of Exercise per Week: 0 days    Minutes of Exercise per Session: Not on file  Stress: Stress Concern Present (04/16/2024)   [employer] of Occupational Health - Occupational Stress Questionnaire    Feeling of Stress: Rather much  Social Connections: Moderately Integrated (04/16/2024)   Social Connection and Isolation  Panel    Frequency of Communication with Friends and Family: More than three times a week    Frequency of Social Gatherings with Friends and Family: More than three times a week    Attends Religious Services: More than 4 times per year    Active Member of [employer] or Organizations: Yes    Attends Banker Meetings: More than 4 times per year  Marital Status: Widowed    Allergies:  Allergies  Allergen Reactions   Asenapine Other (See Comments) and Nausea And Vomiting    Increased tremors   Dextromethorphan Hbr    Guaifenesin     Latuda [Lurasidone Hcl] Other (See Comments)    Tremors     Lurasidone Other (See Comments) and Hives    Other Reaction(s): Angioedema   Phenylephrine      Metabolic Disorder Labs: Lab Results  Component Value Date   HGBA1C 5.3 02/12/2024   MPG 102.54 04/26/2022   MPG 103 12/28/2016   No results found for: PROLACTIN Lab Results  Component Value Date   CHOL 99 (L) 02/12/2024   TRIG 122 02/12/2024   HDL 29 (L) 02/12/2024   CHOLHDL 3.4 02/12/2024   VLDL 10 04/26/2022   LDLCALC 48 02/12/2024   LDLCALC 45 04/26/2022   Lab Results  Component Value Date   TSH 0.289 (L) 03/22/2024   TSH 1.970 02/13/2023    Therapeutic Level Labs: No results found for: LITHIUM Lab Results  Component Value Date   VALPROATE 60 03/20/2024   VALPROATE 69 11/15/2023   No results found for: CBMZ  Current Medications: Current Outpatient Medications  Medication Sig Dispense Refill   acetaminophen  (TYLENOL ) 325 MG tablet Take 1 tablet (325 mg total) by mouth every 6 (six) hours as needed for mild pain (pain score 1-3) (or temp > 100.5). 60 tablet 0   albuterol  (VENTOLIN  HFA) 108 (90 Base) MCG/ACT inhaler Inhale 2 puffs into the lungs every 6 (six) hours as needed for wheezing or shortness of breath. 18 g 2   amoxicillin -clavulanate (AUGMENTIN ) 875-125 MG tablet Take 1 tablet by mouth 2 (two) times daily. 20 tablet 0   aspirin  EC 81 MG tablet Take 1  tablet (81 mg total) by mouth daily. Swallow whole. 30 tablet 0   atorvastatin  (LIPITOR ) 80 MG tablet TAKE 1 TABLET BY MOUTH EVERY DAY 90 tablet 3   benzonatate  (TESSALON ) 200 MG capsule Take 1 capsule (200 mg total) by mouth 2 (two) times daily as needed for cough. (Patient not taking: Reported on 04/17/2024) 20 capsule 0   budesonide -glycopyrrolate -formoterol  (BREZTRI  AEROSPHERE) 160-9-4.8 MCG/ACT AERO inhaler Inhale 2 puffs into the lungs in the morning and at bedtime. 1 each 2   celecoxib  (CELEBREX ) 100 MG capsule TAKE 2 CAPSULES BY MOUTH 2 TIMES DAILY. 60 capsule 1   divalproex  (DEPAKOTE ) 500 MG DR tablet Take half tablet in the morning and 3 tablets at bedtime 105 tablet 2   DULoxetine  (CYMBALTA ) 30 MG capsule Take 3 capsules (90 mg total) by mouth daily. (Patient taking differently: Take 30-60 mg by mouth See admin instructions. 30mg  qam, 60mg  qhs) 270 capsule 0   gabapentin  (NEURONTIN ) 600 MG tablet Take 1 tablet (600 mg total) by mouth daily AND 2 tablets (1,200 mg total) at bedtime. 90 tablet 1   hydrOXYzine  (ATARAX ) 25 MG tablet Take 1 tablet (25 mg total) by mouth 2 (two) times daily as needed. 270 tablet 0   isosorbide  mononitrate (IMDUR ) 30 MG 24 hr tablet TAKE 1 TABLET BY MOUTH 2 TIMES DAILY. 180 tablet 1   Multiple Vitamins-Minerals (MENS 50+ MULTIVITAMIN) TABS Take 1 tablet by mouth in the morning.     nicotine  (NICODERM CQ  - DOSED IN MG/24 HOURS) 21 mg/24hr patch Place 1 patch (21 mg total) onto the skin daily. 42 patch 0   nicotine  polacrilex (NICOTINE  MINI) 2 MG lozenge Take 1 lozenge (2 mg total) by mouth every 2 (two) hours as  needed for smoking cessation. 72 lozenge 3   nitroGLYCERIN  (NITROSTAT ) 0.4 MG SL tablet Place 1 tablet (0.4 mg total) under the tongue every 5 (five) minutes as needed for chest pain. 30 tablet 0   pantoprazole  (PROTONIX ) 40 MG tablet Take 1 tablet (40 mg total) by mouth daily. (Patient taking differently: Take 40 mg by mouth in the morning.) 90 tablet 0    predniSONE  (DELTASONE ) 20 MG tablet Take 1 tablet (20 mg total) by mouth daily with breakfast. 5 tablet 0   QUEtiapine  (SEROQUEL ) 200 MG tablet Take 1.5 tablets (300 mg total) by mouth at bedtime. 90 tablet 0   traZODone  (DESYREL ) 100 MG tablet Take 1 tablet (100 mg total) by mouth at bedtime. 90 tablet 0   No current facility-administered medications for this visit.   Objective Psychiatric Specialty Exam: General Appearance: appears at stated age, casually dressed and groomed ***  Behavior: pleasant and cooperative ***  Psychomotor Activity: no psychomotor agitation or retardation noted ***  Eye Contact: fair *** Speech: normal amount, volume and fluency ***   Mood: euthymic *** Affect: congruent, pleasant and interactive ***  Thought Process: linear, goal directed, no circumstantial or tangential thought process noted, no racing thoughts or flight of ideas *** Descriptions of Associations: intact ***  Thought Content Hallucinations: denies AH, VH , does not appear responding to stimuli *** Delusions: no paranoia, delusions of control, grandeur, ideas of reference, thought broadcasting, and magical thinking *** Suicidal Thoughts: denies SI, intention, plan *** Homicidal Thoughts: denies HI, intention, plan ***  Alertness/Orientation: alert and fully oriented ***  Insight: fair*** Judgment: fair***  Memory: intact ***  Executive Functions  Concentration: intact *** Attention Span: fair *** Recall: intact *** Fund of Knowledge: fair ***  Physical Exam *** General: Pleasant, well-appearing ***. No acute distress. Pulmonary: Normal effort. No wheezing or rales. Skin: No obvious rash or lesions. Neuro: A&Ox3.No focal deficit.  Review of Systems *** No reported symptoms    Screenings: AIMS    Flowsheet Row [facility]) [facility]Discharged) from 12[facility]27/2016 in [facility] BE[facility]IC[facility]   AIMS Total Score 0 1   AUDIT    Flowsheet Row [facility] Admission (Discharged) from 12/27/2016 in [facility] INPATIENT BEHAVIORAL MEDICINE  Alcohol  Use Disorder Identification Test Final Score (AUDIT) 4 0 32   GAD-7    Flowsheet Row Office Visit from 04/05/2024 in [facility] Telephone from 03/27/2024 in [place] [facility]PART[facility] Office Visit from 12/19/2023 in [facility] Office Visit from 03/15/2023 in [facility] at [facility] Video Visit from 08/01/2022 in [facility]  Total GAD-7 Score 6 12 14 6 20    PHQ2-9    Flowsheet Row Office Visit from 04/05/2024 in [facility] Primary Care at [facility] Telephone from 03/27/2024 in [place] POPULATION HEALTH DEPARTMENT Office Visit from 12/19/2023 in [facility] at [facility] Office Visit from 03/15/2023 in [facility] Primary Care at [facility] Office Visit from 12/13/2022 in [facility] Primary Care at [facility]  PHQ-2 Total Score 2 4 4  0 0  PHQ-9 Total Score 7 8 8 2  0   Flowsheet Row ED to Hosp-Admission (Discharged) from 03/20/2024 in [facility] 4E CV SURGICAL PROGRESSIVE CARE Admission (Discharged) from 08/31/2023 in  [facility] ED from 05/30/2023 in [facility] Emergency Department at [facility]  C-SSRS RISK CATEGORY No Risk No Risk No Risk   Ismael Franco, MD PGY-3 Psychiatry Resident

## 2024-04-22 NOTE — Progress Notes (Signed)
 Daily Group Progress Note   Program: CD IOP     Group Time: 9 a.m. to 12 p.m.      Type of Therapy: Process and Psychoeducational    Topic: The therapists check in with group members, assess for SI/HI/psychosis and overall level of functioning. The therapists inquire about sobriety date and number of community support meetings attended since last session.   The therapists focus primarily today on the topic of what a Sponsor is, what a Sponsor does, why a person needs a Marketing executive, and how to get one.   The therapists go into detail concerning how people work steps Financial controller and show members who to find virtual AA and NA meetings 24/7 in addition to how to find free AA and NA literature.    Summary: Keith Gibbs returns to group rating his depression and anxiety as a 5. 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 He wears a mask today still recovering from a respiratory ailment. On a positive note, he has quit smoking cigarettes when the doctor made him realize that not smoking means not feeding his cancer.  He shares with a new group member the positives he has found from being in NA and in having a Sponsor. Keith Gibbs says that he now has 70 days sober. He says that when his doctor was talking to him about cigarettes that he noticed Keith Gibbs's NA key tags telling Keith Gibbs that he had already done the hardest part.'  Keith Gibbs says that he is happy knowing that he is battling a respiratory illness rather than battling his addiction.  Progress Towards Goals: Keith Gibbs reports no change in his sobriety date.  UDS collected: Yes Results: No  AA/NA attended?:  Yes   Sponsor?:  Yes   Elsie Maier, MA, LCSW, [facility], LCAS Darice Simpler, LMFT, LCAS 04/22/2024

## 2024-04-24 ENCOUNTER — Ambulatory Visit (INDEPENDENT_AMBULATORY_CARE_PROVIDER_SITE_OTHER): Payer: MEDICAID

## 2024-04-24 DIAGNOSIS — F250 Schizoaffective disorder, bipolar type: Secondary | ICD-10-CM | POA: Diagnosis not present

## 2024-04-24 DIAGNOSIS — F1111 Opioid abuse, in remission: Secondary | ICD-10-CM

## 2024-04-24 DIAGNOSIS — F4310 Post-traumatic stress disorder, unspecified: Secondary | ICD-10-CM

## 2024-04-24 DIAGNOSIS — F411 Generalized anxiety disorder: Secondary | ICD-10-CM

## 2024-04-24 LAB — TOXICOLOGY SCREEN, URINE: Creatinine, POC: 93 mg/dL

## 2024-04-24 NOTE — Progress Notes (Signed)
 Daily Group Progress Note   Program: CD IOP     Group Time: 9 a.m. to 12 p.m.      Type of Therapy: Process and Psychoeducational    Topic: The therapists check in with group members, assess for SI/HI/psychosis and overall level of functioning. The therapists inquire about sobriety date and number of community support meetings attended since last session.   The therapists discuss and prompt discussion on the following topics today:  How to deal with triggers, including when the drug dealer is the trigger and continue to contact you after you saying no, pain as being a warning sign to alert us  to give attention to an issue, whether it be emotional or physical pain and how to seek services for such issues, importance of getting through the wall of pain to get to recovery, how to address well meaning family members who are interested in directing one's treatment, assisted in processing emotions as it relates to the cycle of addiction, Proceed with the [place] article on The Stages of Relapse, specifically the issues of Redefining fun in recovery and Learning from [facility], explaining the changes that happen in the brain and establish new thoughts and behaviors with these issues.    Summary: Keith Gibbs presents today rating his depression and anxiety both as a 6.  He reports the same sobriety date. He is attending meetings and has a sponsor. He identifies his emotions as restlesss, afraid and tired  He discusses how he is tired of being in pain, both emotionally and physically. Therapists inquire if he is addressing his physical pain. He says his MD has referred him to a pain clinic and he is requesting non-addictive options. Therapists discuss how pain is warning sign. [place] reads out loud from the NA reading for today which speaks of pain and reads about pain being a comparison to feel joy.    Keith Gibbs discusses how he is sleeping a lot and his mother does not want him sleeping.  Keith Gibbs says his mother  does not get out often.  She goes out to eat with friends once per months. He notes several friends and her siblings have passed away. Therapists and peers discuss with him how his mother may be lonely and is wanting his company and this may be the reason for her being on him about his sleeping.  Keith Gibbs says she is stressor for him and she is trying to direct his treatment by telling him he is on too many medications. Therapists asks Keith Gibbs what he is doing nice for himself. He says he is taking naps, going to meetings, sponsor, playing video games and watching videos  Progress Towards Goals: Keith Gibbs reports no change in his sobriety date.  UDS collected: no  Results: tobacco  AA/NA attended?:  Yes   Sponsor?:  Yes   Keith Simpler, MS, LMFT, LCAS [address] [place], [place], [facility], LCAS  04/24/2024

## 2024-04-25 ENCOUNTER — Ambulatory Visit (INDEPENDENT_AMBULATORY_CARE_PROVIDER_SITE_OTHER): Payer: MEDICAID | Admitting: Psychiatry

## 2024-04-25 ENCOUNTER — Encounter: Payer: Self-pay | Admitting: Medical

## 2024-04-25 ENCOUNTER — Other Ambulatory Visit: Payer: Self-pay | Admitting: Orthopedic Surgery

## 2024-04-25 VITALS — BP 118/68 | Wt 242.8 lb

## 2024-04-25 DIAGNOSIS — F4310 Post-traumatic stress disorder, unspecified: Secondary | ICD-10-CM | POA: Diagnosis not present

## 2024-04-25 DIAGNOSIS — F411 Generalized anxiety disorder: Secondary | ICD-10-CM

## 2024-04-25 DIAGNOSIS — F1111 Opioid abuse, in remission: Secondary | ICD-10-CM

## 2024-04-25 DIAGNOSIS — F250 Schizoaffective disorder, bipolar type: Secondary | ICD-10-CM | POA: Diagnosis not present

## 2024-04-25 DIAGNOSIS — F1011 Alcohol abuse, in remission: Secondary | ICD-10-CM

## 2024-04-25 DIAGNOSIS — F1411 Cocaine abuse, in remission: Secondary | ICD-10-CM

## 2024-04-25 MED ORDER — QUETIAPINE FUMARATE 200 MG PO TABS: 200.0000 mg | ORAL_TABLET | Freq: Every day | ORAL | 0 refills | Status: DC

## 2024-04-25 MED ORDER — DIVALPROEX SODIUM 500 MG PO DR TAB: DELAYED_RELEASE_TABLET | ORAL | 1 refills | Status: DC

## 2024-04-25 MED ORDER — HYDROXYZINE HCL 25 MG PO TABS: 25.0000 mg | ORAL_TABLET | Freq: Two times a day (BID) | ORAL | 0 refills | Status: DC | PRN

## 2024-04-25 MED ORDER — DULOXETINE HCL 30 MG PO CPEP: 90.0000 mg | ORAL_CAPSULE | Freq: Every day | ORAL | 1 refills | Status: DC

## 2024-04-25 MED ORDER — GABAPENTIN 600 MG PO TABS
ORAL_TABLET | ORAL | 1 refills | Status: DC
Start: 2024-04-25 — End: 2024-06-05

## 2024-04-25 MED ORDER — TRAZODONE HCL 100 MG PO TABS: 50.0000 mg | ORAL_TABLET | Freq: Every day | ORAL | 0 refills | Status: AC

## 2024-04-25 NOTE — Patient Instructions (Addendum)
 Thank you for attending your appointment today.  -- decrease Seroquel  to 200 mg -- decrease trazodone  to 50 mg for one week (half a tablet) and then stop -- Continue other medications as prescribed.  Please do not make any changes to medications without first discussing with your provider. If you are experiencing a psychiatric emergency, please call 911 or present to your nearest emergency department. Additional crisis, medication management, and therapy resources are included below.  [facility]  [address], [place], [place] [address] [phone] WALK-IN URGENT CARE 24/7 FOR ANYONE [address], [place], [place]  [phone] Fax: [phone] [email or web address] *Interpreters available *Accepts all insurance and uninsured for Urgent Care needs *Accepts Medicaid and uninsured for outpatient treatment (below)      ONLY FOR [facility]  Below:    Outpatient New Patient Assessment/Therapy Walk-ins:        Monday, Wednesday, and Thursday 8am until slots are full (first come, first served)                   New Patient Psychiatry/Medication Management        Monday-Friday 8am-11am (first come, first served)               For all walk-ins we ask that you arrive by 7:15am, because patients will be seen in the order of arrival.

## 2024-04-25 NOTE — Progress Notes (Signed)
 [facility] MD/PA/NP OP Progress Note  04/25/2024 8:15 AM ALBINO Gibbs  MRN:  [id number]  Assessment and Plan:  Patient seen today in person. In the previous appointment, Seroquel  was increased to 300 mg. Patient is doing fairly today. He is very proactive in stopping his substance use, involved in CDIOP, NA, going through the 12 step program, and has a sponsor he is in close communications with. He does have psychosocial stressors like lung cancer and has a history of trauma seeing his wife pass from lung cancer and his daughter passing soon after. Patient's mother is concerned of oversedation but patient feels sleep is not necessarily too off over than occasionally sleeping the day through after a nap about weekly. Shared decision making was completed and we discussed decreasing Seroquel  back from 300 mg to 200 mg and tapering off trazodone . In the future, can consider tapering off Cymbalta . While he has passive SI thoughts attributing to his medical condition, he denies having active SI and states that he frequently discusses his feelings to his sponsor and during group therapy. Insight is improving regarding his substance use and makes future oriented statements regarding working on coping strategies when he is feeling anxious. F/u in 2 months.  Schizoaffective disorder, bipolar type PTSD Chronic pain - Decrease Seroquel  to 200 mg nightly  -labs updated 01/2024 - Decrease trazodone  to 50 mg nightly for one week then stop - Continue Depakote  250mg  daily and 1500mg  at bedtime - Continue Cymbalta  90 mg daily - Continue gabapentin  600 mg daily and 1200 mg nightly - Continue hydroxyzine  25 mg 2 times daily as needed  --VPA level: 69 and CMP : WNL  Stimulant use d/o Etoh use d/o Cannabis use disorder, remission He stopped cocaine and alcohol  use on 02/11/2024. - In CDIOP - In NA and has a sponsor  Tobacco use disorder, in remission He stopped smoking in 03/2024 - continue to monitor   Chief  Complaint:  Chief Complaint  Patient presents with   Follow-up   Identifying Information: Keith Gibbs is a 63 year old male with a past psychiatric history of schizoaffective, cocaine use disorder, alcohol  use disorder; numerous hospitalizations due to substance intoxication/withdrawal, suicidal thoughts/attempts, mania, psychosis; numerous suicide attempts most recent required medical and psychiatric hospitalization in 07/2022 and a PMH of COPD, CVA, CAD, GERD, HTN, arthritis, and chronic pain.   Subjective: Patient seen with his mother.  Patient reports feeling tired and disoriented today. Since the previous visit, he has been having anxiety regarding his lung cancer. He states that he found out that he has lung cancer a few months ago. He states that he lost his wife to cancer 6 years ago and 3 months later, he lost his daughter in a MVA. He states that he feels lonely and wants to start dating again. He sometimes feels like he is tired of fighting in his life. He states that during CDIOP, he talks about these feelings.     Regarding psychiatric symptoms, he states he is less upset but feels depressed about his medical condition. He discusses his resentment about doctors, stating when I was young, they would poke and prod at me. He states he talks to his sponsor a lot about his stressors. He denies active SI and his states his thoughts are more so I am tired of my life. He states that he has faith and believes in a higher power.  He states that he takes care of the outdoors and doing projects in the house.  Patient  reports increased sleep, reporting 1.5 hour naps and sleeping 6-8 hours nightly and about weekly sleeping the day away. Patient reports good appetite.   Patient denies current SI, HI, and AVH.   Substance use: Stopped smoking last month. He states he has been going to NA and CDIOP and he has been enjoying his experience. He stopped cocaine and alcohol  use on 02/11/2024.  Visit  Diagnosis:    ICD-10-CM   1. Schizoaffective disorder, bipolar type (HCC)  F25.0         Past Psychiatric History: Patient endorses being hospitalized 3 times.  Patient reports the first time he was hospitalized in his 30s and the last time was a 1-2 years ago at [facility].  Reports that he was at [facility] he stayed approximately 30 days.  Patient reports that all 3 hospitalizations were due to suicide attempts.  Patient reports he has tried to hang himself (the belt broke), slit his wrist, OD.   Substance use:  Cocaine- since 22 years ago most recently one week, frequency - once every 3 months  Alcohol - once/3 weeks- two 40 oz beers at a time  Family Psychiatric History: Sister: Depression, paternal grandma-depression and anxiety, multiple maternal family members: EtOH use disorder, paternal aunt: schizophrenia was hospitalized, another paternal aunt: depression and anxiety   Social History:  Living: live with mother in [place] Occupation: retired Relationship: widowed Children: yes 3 - ages 26 and 74 Support: religion Legal History: numerous times in prison- decades ago  Past Medical History:  Past Medical History:  Diagnosis Date   Anginal pain    Anxiety    Arthritis    Bipolar 1 disorder (HCC)    Bursitis    CAD (coronary artery disease)    Chronic pain    COPD (chronic obstructive pulmonary disease) (HCC)    Current use of long term anticoagulation    DAPT (ASA + clopidogrel )   Depression    Diverticulitis    Dyspnea    GERD (gastroesophageal reflux disease)    Grade I diastolic dysfunction    Hepatitis C 2012   No longer has Hep C   HLD (hyperlipidemia)    Hypertension    MI (myocardial infarction) (HCC)    Polysubstance abuse (HCC)    cocaine, marijuana, ETOH   PUD (peptic ulcer disease)    S/P angioplasty with stent 06/10/2016   a.) 90% stenosis of pLAD to mLAD - 2.5 x 18 mm Xience Alpine (DES x 1) placed to pLAD   S/P PTCA (percutaneous transluminal coronary  angioplasty) 12/04/2019   a.) 60% in stent restenosis of DES to pLAD; LVEF 65%.   Schizophrenia (HCC)    Stroke [facility])    Valvular insufficiency    a.) Mild MR, TR, PR; mild to moderate AR on 03/05/2018 TTE    Past Surgical History:  Procedure Laterality Date   ABDOMINAL SURGERY     removed small piece of intestines due to [facility] Diverticulosis   ANTERIOR LAT LUMBAR FUSION N/A 08/23/2022   Procedure: DIRECT LATERAL INTERBODY FUSION  LUMBAR TWO- LUMBAR THREE, LUMBAR THREE-LUMBAR FOUR , EXPLORE AND EXTEND FUSION LUMBAR TWO-LUMBAR FIVE, POSTERIOR DECOMPRESSION LUMBAR THREE-LUMBAR FOUR, LEFT LUMBAR TWO-LUMBAR THREE;  Surgeon: Debby Dorn MATSU, MD;  Location: [facility] OR;  Service: Neurosurgery;  Laterality: N/A;   APPENDECTOMY     BACK SURGERY     CARDIAC CATHETERIZATION Left 06/10/2016   Procedure: Left Heart Cath and Coronary Angiography;  Surgeon: Denyse DELENA Bathe, MD;  Location: [facility] INVASIVE CV LAB;  Service: Cardiovascular;  Laterality: Left;   CARDIAC CATHETERIZATION N/A 06/10/2016   Procedure: Coronary Stent Intervention;  Surgeon: Cara JONETTA Lovelace, MD;  Location: [facility] INVASIVE CV LAB;  Service: Cardiovascular;  Laterality: N/A;   CHOLECYSTECTOMY N/A 02/17/2022   Procedure: LAPAROSCOPIC CHOLECYSTECTOMY;  Surgeon: Stevie Herlene Righter, MD;  Location: [facility] OR;  Service: General;  Laterality: N/A;   COLON SURGERY     COLONOSCOPY     COLONOSCOPY WITH PROPOFOL  N/A 01/05/2017   Procedure: COLONOSCOPY WITH PROPOFOL ;  Surgeon: Therisa Bi, MD;  Location: [facility] ENDOSCOPY;  Service: Endoscopy;  Laterality: N/A;   COLONOSCOPY WITH PROPOFOL  N/A 02/13/2020   Procedure: COLONOSCOPY WITH PROPOFOL ;  Surgeon: Janalyn Keene NOVAK, MD;  Location: [facility] ENDOSCOPY;  Service: Endoscopy;  Laterality: N/A;   CORONARY ANGIOPLASTY WITH STENT PLACEMENT     CORONARY PRESSURE/FFR STUDY N/A 12/04/2019   Procedure: INTRAVASCULAR PRESSURE WIRE/FFR STUDY;  Surgeon: Wonda Sharper, MD;  Location: [facility] INVASIVE CV LAB;   Service: Cardiovascular;  Laterality: N/A;   ESOPHAGOGASTRODUODENOSCOPY (EGD) WITH PROPOFOL  N/A 01/05/2017   Procedure: ESOPHAGOGASTRODUODENOSCOPY (EGD) WITH PROPOFOL ;  Surgeon: Therisa Bi, MD;  Location: [facility] ENDOSCOPY;  Service: Endoscopy;  Laterality: N/A;   ESOPHAGOGASTRODUODENOSCOPY (EGD) WITH PROPOFOL  N/A 02/13/2020   Procedure: ESOPHAGOGASTRODUODENOSCOPY (EGD) WITH PROPOFOL ;  Surgeon: Janalyn Keene NOVAK, MD;  Location: [facility] ENDOSCOPY;  Service: Endoscopy;  Laterality: N/A;   KNEE ARTHROSCOPY WITH MEDIAL MENISECTOMY Right 09/05/2017   Procedure: KNEE ARTHROSCOPY WITH MEDIAL AND LATERAL  MENISECTOMY PARTIAL SYNOVECTOMY;  Surgeon: Kathlynn Sharper, MD;  Location: [facility] ORS;  Service: Orthopedics;  Laterality: Right;   LEFT HEART CATH AND CORONARY ANGIOGRAPHY N/A 12/04/2019   Procedure: LEFT HEART CATH AND CORONARY ANGIOGRAPHY;  Surgeon: Wonda Sharper, MD;  Location: [facility] INVASIVE CV LAB;  Service: Cardiovascular;  Laterality: N/A;   LEFT HEART CATH AND CORONARY ANGIOGRAPHY Left 11/25/2022   Procedure: LEFT HEART CATH AND CORONARY ANGIOGRAPHY;  Surgeon: Fernand Denyse LABOR, MD;  Location: [facility] INVASIVE CV LAB;  Service: Cardiovascular;  Laterality: Left;   REVERSE SHOULDER ARTHROPLASTY Right 08/31/2023   Procedure: RIGHT REVERSE SHOULDER ARTHROPLASTY;  Surgeon: [name], MD;  Location: [facility] OR;  Service: Orthopedics;  Laterality: Right;   SHOULDER SURGERY Right 04/09/2012   SPINE SURGERY     TOTAL KNEE ARTHROPLASTY Right 01/19/2021   Procedure: TOTAL KNEE ARTHROPLASTY - Medford Amber to Assist;  Surgeon: Kathlynn Sharper, MD;  Location: [facility] ORS;  Service: Orthopedics;  Laterality: Right;    Family History:  Family History  Problem Relation Age of Onset   Osteoarthritis Mother    Heart disease Mother    Hypertension Mother    Depression Mother    Heart disease Father    Early death Father    Hypertension Father    Heart attack Father    Hypertension Sister    Parkinson's disease Maternal  Grandfather    Prostate cancer Neg Hx    Bladder Cancer Neg Hx    Kidney cancer Neg Hx    Tremor Neg Hx     Social History   Socioeconomic History   Marital status: Widowed    Spouse name: Not on file   Number of children: Not on file   Years of education: Not on file   Highest education level: GED or equivalent  Occupational History   Occupation: disability   Occupation: stocking at gas station    Comment: 2 days per week  Tobacco Use   Smoking status: Every Day    Current packs/day: 1.50  Average packs/day: 1.5 packs/day for 42.5 years (63.7 ttl pk-yrs)    Types: Cigarettes    Start date: 10/26/1978    Last attempt to quit: 2000    Passive exposure: Current   Smokeless tobacco: Never   Tobacco comments:    Smokes 0.5 PPD - khj 04/17/2024        Started smoking at 63 yrs old    Smoked 1.5 PPD at his heaviest  Vaping Use   Vaping status: Never Used  Substance and Sexual Activity   Alcohol  use: Not Currently    Alcohol /week: 3.0 standard drinks of alcohol     Types: 3 Cans of beer per week    Comment: occassionally    Drug use: Not Currently    Types: Cocaine, Marijuana    Comment: last on saturday   Sexual activity: Not Currently    Partners: Female    Birth control/protection: None  Other Topics Concern   Not on file  Social History Narrative   Not on file   Social Drivers of Health   Financial Resource Strain: High Risk (04/16/2024)   Overall Financial Resource Strain (CARDIA)    Difficulty of Paying Living Expenses: Hard  Food Insecurity: Food Insecurity Present (04/16/2024)   Hunger Vital Sign    Worried About Running Out of Food in the Last Year: Sometimes true    Ran Out of Food in the Last Year: Sometimes true  Transportation Needs: No Transportation Needs (04/16/2024)   PRAPARE - Administrator, Civil Service (Medical): No    Lack of Transportation (Non-Medical): No  Physical Activity: Inactive (04/16/2024)   Exercise Vital Sign    Days  of Exercise per Week: 0 days    Minutes of Exercise per Session: Not on file  Stress: Stress Concern Present (04/16/2024)   [employer] of Occupational Health - Occupational Stress Questionnaire    Feeling of Stress: Rather much  Social Connections: Moderately Integrated (04/16/2024)   Social Connection and Isolation Panel    Frequency of Communication with Friends and Family: More than three times a week    Frequency of Social Gatherings with Friends and Family: More than three times a week    Attends Religious Services: More than 4 times per year    Active Member of [employer] or Organizations: Yes    Attends Banker Meetings: More than 4 times per year    Marital Status: Widowed    Allergies:  Allergies  Allergen Reactions   Asenapine Other (See Comments) and Nausea And Vomiting    Increased tremors   Dextromethorphan Hbr    Guaifenesin     Latuda [Lurasidone Hcl] Other (See Comments)    Tremors     Lurasidone Other (See Comments) and Hives    Other Reaction(s): Angioedema   Phenylephrine      Metabolic Disorder Labs: Lab Results  Component Value Date   HGBA1C 5.3 02/12/2024   MPG 102.54 04/26/2022   MPG 103 12/28/2016   No results found for: PROLACTIN Lab Results  Component Value Date   CHOL 99 (L) 02/12/2024   TRIG 122 02/12/2024   HDL 29 (L) 02/12/2024   CHOLHDL 3.4 02/12/2024   VLDL 10 04/26/2022   LDLCALC 48 02/12/2024   LDLCALC 45 04/26/2022   Lab Results  Component Value Date   TSH 0.289 (L) 03/22/2024   TSH 1.970 02/13/2023    Therapeutic Level Labs: No results found for: LITHIUM Lab Results  Component Value Date   VALPROATE 60 03/20/2024  VALPROATE 69 11/15/2023   No results found for: CBMZ  Current Medications: Current Outpatient Medications  Medication Sig Dispense Refill   acetaminophen  (TYLENOL ) 325 MG tablet Take 1 tablet (325 mg total) by mouth every 6 (six) hours as needed for mild pain (pain score 1-3) (or temp >  100.5). 60 tablet 0   albuterol  (VENTOLIN  HFA) 108 (90 Base) MCG/ACT inhaler Inhale 2 puffs into the lungs every 6 (six) hours as needed for wheezing or shortness of breath. 18 g 2   amoxicillin -clavulanate (AUGMENTIN ) 875-125 MG tablet Take 1 tablet by mouth 2 (two) times daily. 20 tablet 0   aspirin  EC 81 MG tablet Take 1 tablet (81 mg total) by mouth daily. Swallow whole. 30 tablet 0   atorvastatin  (LIPITOR ) 80 MG tablet TAKE 1 TABLET BY MOUTH EVERY DAY 90 tablet 3   benzonatate  (TESSALON ) 200 MG capsule Take 1 capsule (200 mg total) by mouth 2 (two) times daily as needed for cough. (Patient not taking: Reported on 04/17/2024) 20 capsule 0   budesonide -glycopyrrolate -formoterol  (BREZTRI  AEROSPHERE) 160-9-4.8 MCG/ACT AERO inhaler Inhale 2 puffs into the lungs in the morning and at bedtime. 1 each 2   celecoxib  (CELEBREX ) 100 MG capsule TAKE 2 CAPSULES BY MOUTH 2 TIMES DAILY. 60 capsule 1   divalproex  (DEPAKOTE ) 500 MG DR tablet Take half tablet in the morning and 3 tablets at bedtime 105 tablet 2   DULoxetine  (CYMBALTA ) 30 MG capsule Take 3 capsules (90 mg total) by mouth daily. (Patient taking differently: Take 30-60 mg by mouth See admin instructions. 30mg  qam, 60mg  qhs) 270 capsule 0   gabapentin  (NEURONTIN ) 600 MG tablet Take 1 tablet (600 mg total) by mouth daily AND 2 tablets (1,200 mg total) at bedtime. 90 tablet 1   hydrOXYzine  (ATARAX ) 25 MG tablet Take 1 tablet (25 mg total) by mouth 2 (two) times daily as needed. 270 tablet 0   isosorbide  mononitrate (IMDUR ) 30 MG 24 hr tablet TAKE 1 TABLET BY MOUTH 2 TIMES DAILY. 180 tablet 1   Multiple Vitamins-Minerals (MENS 50+ MULTIVITAMIN) TABS Take 1 tablet by mouth in the morning.     nicotine  (NICODERM CQ  - DOSED IN MG/24 HOURS) 21 mg/24hr patch Place 1 patch (21 mg total) onto the skin daily. 42 patch 0   nicotine  polacrilex (NICOTINE  MINI) 2 MG lozenge Take 1 lozenge (2 mg total) by mouth every 2 (two) hours as needed for smoking cessation. 72  lozenge 3   nitroGLYCERIN  (NITROSTAT ) 0.4 MG SL tablet Place 1 tablet (0.4 mg total) under the tongue every 5 (five) minutes as needed for chest pain. 30 tablet 0   pantoprazole  (PROTONIX ) 40 MG tablet Take 1 tablet (40 mg total) by mouth daily. (Patient taking differently: Take 40 mg by mouth in the morning.) 90 tablet 0   predniSONE  (DELTASONE ) 20 MG tablet Take 1 tablet (20 mg total) by mouth daily with breakfast. 5 tablet 0   QUEtiapine  (SEROQUEL ) 200 MG tablet Take 1.5 tablets (300 mg total) by mouth at bedtime. 90 tablet 0   traZODone  (DESYREL ) 100 MG tablet Take 1 tablet (100 mg total) by mouth at bedtime. 90 tablet 0   No current facility-administered medications for this visit.   Objective Psychiatric Specialty Exam: General Appearance: appears at stated age, casually dressed and groomed   Behavior: pleasant and cooperative   Psychomotor Activity: no psychomotor agitation or retardation noted   Eye Contact: fair  Speech: normal amount, volume and fluency    Mood: anxious  Affect:  congruent  Thought Process: linear, goal directed, no circumstantial or tangential thought process noted, no racing thoughts or flight of ideas  Descriptions of Associations: intact   Thought Content Hallucinations: denies AH, VH , does not appear responding to stimuli  Delusions: no paranoia, delusions of control, grandeur, ideas of reference, thought broadcasting, and magical thinking  Suicidal Thoughts: denies SI, intention, plan  Homicidal Thoughts: denies HI, intention, plan   Alertness/Orientation: alert and fully oriented   Insight: fair Judgment: fai  Memory: intact   Executive Functions  Concentration: intact  Attention Span: fair  Recall: intact  Fund of Knowledge: fair   Physical Exam  General: Pleasant, well-appearing. No acute distress. Pulmonary: Normal effort. No wheezing or rales. Skin: No obvious rash or lesions. Neuro: A&Ox3.No focal deficit.  Review of Systems   No reported symptoms    Screenings: AIMS    Flowsheet Row [facility]) [facility] (Discharged) from 12/27/2016 in [facility] INPATIENT BEHAVIORAL MEDICINE  AIMS Total Score 0 1   AUDIT    Flowsheet Row [facility]) [facility] Admission (Discharged) from 12/27/2016 in [facility] INPATIENT BEHAVIORAL MEDICINE  Alcohol  Use Disorder Identification Test Final Score (AUDIT) 4 0 32   GAD-7    Flowsheet Row Office Visit from 04/05/2024 in [facility] at [facility] Telephone from 03/27/2024 in [place] POP[facility]ALTH DEPARTMENT Office Visit from 12/19/2023 in [facility] Primary Care at [facility] Office Visit from 03/15/2023 in [facility] Primary Care at [facility] Video Visit from 08/01/2022 in [facility]  Total GAD-7 Score 6 12 14 6 20    PHQ2-9    Flowsheet Row Office Visit from 04/05/2024 in [facility] Primary Care at [facility] Telephone from 03/27/2024 in [place] POPULATION HEALTH DEPARTMENT Office Visit from 12/19/2023 in [facility] Primary Care at [facility] Office Visit from 03/15/2023 in [facility] Primary Care at [facility] Office Visit from 12/13/2022 in [facility] Primary Care at [facility]  PHQ-2 Total Score 2 4 4  0 0  PHQ-9 Total Score 7 8 8 2  0   Flowsheet Row ED to Hosp-Admission (Discharged) from 03/20/2024 in [facility] 4E CV SURGICAL PROGRESSIVE CARE Admission (Discharged) from 08/31/2023 in [facility] 5 NORTH ORTHOPEDICS ED from 05/30/2023 in [facility] Emergency Department at [facility]  C-SSRS RISK CATEGORY No Risk No Risk No Risk   Ismael Franco, MD PGY-3 Psychiatry Resident

## 2024-04-26 ENCOUNTER — Ambulatory Visit (INDEPENDENT_AMBULATORY_CARE_PROVIDER_SITE_OTHER): Payer: MEDICAID | Admitting: Licensed Clinical Social Worker

## 2024-04-26 DIAGNOSIS — F1220 Cannabis dependence, uncomplicated: Secondary | ICD-10-CM

## 2024-04-26 DIAGNOSIS — F1420 Cocaine dependence, uncomplicated: Secondary | ICD-10-CM

## 2024-04-26 DIAGNOSIS — F1011 Alcohol abuse, in remission: Secondary | ICD-10-CM

## 2024-04-26 NOTE — Progress Notes (Signed)
 Daily Group Progress Note   Program: CD IOP     Group Time: 9 a.m. to 12 p.m.      Type of Therapy: Process and Psychoeducational    Topic: The therapist checks in with group members, assesses for SI/HI/psychosis and overall level of functioning. The therapist inquires about sobriety date and number of community support meetings attended since last session.   The therapist facilitates group discussions on a number of topics such as self will and the need to prioritize one's addiction recovery given that this disease is one that can prove to be fatal for many.  The therapist talks about the importance of scheduling and it deserves that when the person uses the word try that here she is typically not likely to end up following through.  The therapist talks about the fact that using drugs and alcohol  when a person is wait-and-see age or an adolescent will hardwire this pattern in the person's brain indefinitely with stress being a factor that can activate it even though it has been dormant for some time.  The therapist makes the observation that sometimes what an individual may be going through could end up being an inspiration to others.  Lastly, the therapist talks about the history of Alcoholics Anonymous and Narcotics Anonymous explaining that everything they do in the program has a reason and that people must trust that there is a method to their madness.   Summary: Keith Gibbs resents today rating both his depression and anxiety as a 6.  He describes his mood as relaxed; however adds that he was tense when he first got to group today.  He goes on to say that he got his bubble busted after talking to the nurse on the phone who suggested that once the cancer was removed that it was quite possible it would pop up somewhere else.  The therapist and other group members explain that a recurrence of the cancer he is always a possibility and a cancer survivor but that he will likely be attending more  regular follow-up to monitor for this and provide early treatment if needed.  Keith Gibbs says that this makes sense saying that he was told he would be seen at least every 3 months to be checked.  Keith Gibbs says that the visit with his psychiatrist that was attended by his mother went well and that he had some of his medications reduced.  He talks about his resentment towards doctors as a result of the procedures that he underwent as a child.  The therapist questions how the Keith Gibbs can work through these resentments and reminds him that if it were not for these doctors doing these procedures when he was a child that they would not have successfully diagnosed and treated the condition which is what was the end result.  Keith Gibbs admits that he felt like giving up over the past couple of days being tired of doctors appointments and tired of taking medications.  Other group members make the observation that Keith Gibbs is one of the strongest group members and that his sobriety in the face of his current situation has been an inspiration to them.  Keith Gibbs is surprised to know that he has been having an impact on others.  He says that he has been wanting instant gratification and has wanted things to be smooth and okay all of the time.  By the conclusion of group, he says that he is not going to give up and that his biggest take away  from today is that Keith Gibbs is important as people in recovery lean on each other for support.  Keith Gibbs says that he was struggling to get the yard mowed because of his breathing problems with the new group member offering to come cut his grass for him if Keith Gibbs needs help in the future.    Progress Towards Goals: Keith Gibbs reports no change in his sobriety date.   UDS collected: No Results: Yes, negative for drugs and alcohol    AA/NA attended?:  Yes   Sponsor?:  Yes     Keith Maier, MA, LCSW, [facility], LCAS 04/26/2024

## 2024-04-29 ENCOUNTER — Ambulatory Visit (INDEPENDENT_AMBULATORY_CARE_PROVIDER_SITE_OTHER): Payer: MEDICAID | Admitting: Medical

## 2024-04-29 DIAGNOSIS — F4310 Post-traumatic stress disorder, unspecified: Secondary | ICD-10-CM

## 2024-04-29 DIAGNOSIS — F1420 Cocaine dependence, uncomplicated: Secondary | ICD-10-CM | POA: Diagnosis not present

## 2024-04-29 DIAGNOSIS — F1011 Alcohol abuse, in remission: Secondary | ICD-10-CM

## 2024-04-29 DIAGNOSIS — F1220 Cannabis dependence, uncomplicated: Secondary | ICD-10-CM

## 2024-04-29 DIAGNOSIS — F250 Schizoaffective disorder, bipolar type: Secondary | ICD-10-CM

## 2024-04-29 NOTE — Progress Notes (Signed)
 Daily Group Progress Note   Program: [facility] IOP     Group Time: 9 a.m. to 12 p.m.      Type of Therapy: Process and Psychoeducational    Topic: The therapists check in with group members, assess for SI/HI/psychosis and overall level of functioning. The therapists inquire about sobriety date and number of community support meetings attended since last session.   The therapists introduce a new group member and read the NA Just for Today reading from 04/27/24 on the 30 Day Wonder asking group members to discuss what their takeaway is from this and the biological reason that people would be bored in early recovery. The therapists suggest that comparing one's self to others or trying to be special or unique are both products of ego and likely to end only in misery. The therapists suggest that many people like attending meetings and taking UDS as the accountability helps to keep them on track. The therapists cover the importance of learning to be comfortable with being uncomfortable and discuss acceptance and commitment therapy normalizing some amount of anxiety, depression, and anger as a part of life. The therapists talk about the concept of self-forgiveness as it related to Steps 4 and 5 reminding people that they are not who they are in their disease. The therapists explain that people who beat up on themselves for actions when using who claim to understand that addiction is a disease still are not fully grasping that it is a disease. The therapists observe that people do not see the actions of a family member with Alzheimer's or Huntington's Disease as defining who the person is when healthy. Lastly, the therapists discuss the Abstinence Stage of Recovery and the tasks involved in this stage as well as the difference between acute withdrawal and pot-acute withdrawal syndrome.     Summary: Keith Gibbs presents today rating his depression and anxiety both as a 3.  He says that he is not out at 70 something days  of sobriety and that he has a sponsor.  Keith Gibbs says that if he misses a meeting that his sponsor will get on him with Keith Gibbs saying that this is a good thing.  He says that the past weekend was fantastic.  He says that he got plenty of rest and that he opened up more with his sponsor who in turn opened up more with him.  Keith Gibbs says that his sponsor helped him to realize that he is not special.  Also, his sponsor told him that it is none of his business what other people think about him as Keith Gibbs has a tendency to worry about this.  Keith Gibbs describes his mood as happy and free.  In discussing the NA just for the day reading on a 30-day wonder, Keith Gibbs says that he is a victim of this and that this explains why he has only been able to get 4 months of sobriety at a time.  He says that the novelty of meetings would wear off and he would stop attending which would lead to relapse.  He states his intent to be devoted to recovery for life.  He receives praise from other group members after a group member shares that Keith Gibbs told a guy who tried to buy him a cigarette that he did not have One as he is no longer a smoker.  At the beginning of group, Keith Gibbs brings an old NA Basic Text giving it to one of the newer members who does not have one.  Progress Towards Goals: Keith Gibbs reports no change in his sobriety date.   UDS collected: No Results: No   AA/NA attended?:  Yes   Sponsor?:  Yes   Keith Maier, MA, LCSW, [employer], LCAS Keith Simpler, MS, LMFT, LCAS   04/29/2024

## 2024-04-30 ENCOUNTER — Encounter: Payer: Self-pay | Admitting: *Deleted

## 2024-04-30 ENCOUNTER — Encounter: Payer: Self-pay | Admitting: Thoracic Surgery (Cardiothoracic Vascular Surgery)

## 2024-04-30 ENCOUNTER — Ambulatory Visit
Payer: MEDICAID | Attending: Thoracic Surgery (Cardiothoracic Vascular Surgery) | Admitting: Thoracic Surgery (Cardiothoracic Vascular Surgery)

## 2024-04-30 ENCOUNTER — Other Ambulatory Visit: Payer: Self-pay | Admitting: *Deleted

## 2024-04-30 ENCOUNTER — Encounter (HOSPITAL_COMMUNITY): Payer: MEDICAID | Admitting: Psychiatry

## 2024-04-30 VITALS — BP 126/77 | HR 56 | Resp 20 | Ht 75.0 in | Wt 249.3 lb

## 2024-04-30 DIAGNOSIS — R911 Solitary pulmonary nodule: Secondary | ICD-10-CM | POA: Insufficient documentation

## 2024-04-30 NOTE — Progress Notes (Signed)
 PCP is Lorren Greig PARAS, NP Referring Provider is Isadora Hose, MD  Chief Complaint  Patient presents with   Lung Lesion    New patient consultation, PET 9/8, Chest CT 5/6, PFTs 04/17/24    HPI:Keith Gibbs a symptom consultation regarding a right upper lobe lung nodule.  Keith Gibbs is a 63 year old man with past medical history significant for bipolar 1 disorder, schizophrenia, polysubstance abuse, CAD, mild to moderate aortic insufficiency, hypertension, hyperlipidemia, MI, hepatitis C, numerous orthopedic and spine surgeries, chronic pain, tobacco abuse, and COPD.  He smoked about a half of cigarettes for 45 years prior to quitting on 04/17/2024.  He was admitted to the hospital in August with a COPD exacerbation.  Had presented with chest heaviness but cardiac causes were ruled out.  Treated with antibiotics and steroids.  He had a CT which showed an enlarging right upper lobe nodule.  He saw Dr. Isadora.  A PET/CT showed the nodule is hypermetabolic.  There was no evidence of regional or distant metastatic disease.  There was a left level 2 lymph node in the neck that was mildly hypermetabolic.  As noted he quit smoking about 2 weeks ago.  Still has coughing and wheezing.  Activities are largely limited by pain and shortness of breath.  Not having any consistent anginal type pain with activity.  Was told by cardiology that his heart was strong enough for surgery.  Zubrod Score: At the time of surgery this patient's most appropriate activity status/level should be described as: []     0    Normal activity, no symptoms [x]     1    Restricted in physical strenuous activity but ambulatory, able to do out light work []     2    Ambulatory and capable of self care, unable to do work activities, up and about >50 % of waking hours                              []     3    Only limited self care, in bed greater than 50% of waking hours []     4    Completely disabled, no self care, confined to bed or  chair []     5    Moribund  Past Medical History:  Diagnosis Date   Anginal pain    Anxiety    Arthritis    Bipolar 1 disorder (HCC)    Bursitis    CAD (coronary artery disease)    Chronic pain    COPD (chronic obstructive pulmonary disease) (HCC)    Current use of long term anticoagulation    DAPT (ASA + clopidogrel )   Depression    Diverticulitis    Dyspnea    GERD (gastroesophageal reflux disease)    Grade I diastolic dysfunction    Hepatitis C 2012   No longer has Hep C   HLD (hyperlipidemia)    Hypertension    MI (myocardial infarction) (HCC)    Polysubstance abuse (HCC)    cocaine, marijuana, ETOH   PUD (peptic ulcer disease)    S/P angioplasty with stent 06/10/2016   a.) 90% stenosis of pLAD to mLAD - 2.5 x 18 mm Xience Alpine (DES x 1) placed to pLAD   S/P PTCA (percutaneous transluminal coronary angioplasty) 12/04/2019   a.) 60% in stent restenosis of DES to pLAD; LVEF 65%.   Schizophrenia (HCC)    Stroke [facility])    Valvular  insufficiency    a.) Mild MR, TR, PR; mild to moderate AR on 03/05/2018 TTE    Past Surgical History:  Procedure Laterality Date   ABDOMINAL SURGERY     removed small piece of intestines due to [facility] Diverticulosis   ANTERIOR LAT LUMBAR FUSION N/A 08/23/2022   Procedure: DIRECT LATERAL INTERBODY FUSION  LUMBAR TWO- LUMBAR THREE, LUMBAR THREE-LUMBAR FOUR , EXPLORE AND EXTEND FUSION LUMBAR TWO-LUMBAR FIVE, POSTERIOR DECOMPRESSION LUMBAR THREE-LUMBAR FOUR, LEFT LUMBAR TWO-LUMBAR THREE;  Surgeon: Debby Dorn MATSU, MD;  Location: [facility] OR;  Service: Neurosurgery;  Laterality: N/A;   APPENDECTOMY     BACK SURGERY     CARDIAC CATHETERIZATION Left 06/10/2016   Procedure: Left Heart Cath and Coronary Angiography;  Surgeon: Denyse DELENA Bathe, MD;  Location: [facility] INVASIVE CV LAB;  Service: Cardiovascular;  Laterality: Left;   CARDIAC CATHETERIZATION N/A 06/10/2016   Procedure: Coronary Stent Intervention;  Surgeon: Cara JONETTA Lovelace, MD;  Location: [facility]  INVASIVE CV LAB;  Service: Cardiovascular;  Laterality: N/A;   CHOLECYSTECTOMY N/A 02/17/2022   Procedure: LAPAROSCOPIC CHOLECYSTECTOMY;  Surgeon: Stevie Herlene Righter, MD;  Location: [facility] OR;  Service: General;  Laterality: N/A;   COLON SURGERY     COLONOSCOPY     COLONOSCOPY WITH PROPOFOL  N/A 01/05/2017   Procedure: COLONOSCOPY WITH PROPOFOL ;  Surgeon: Therisa Bi, MD;  Location: [facility] ENDOSCOPY;  Service: Endoscopy;  Laterality: N/A;   COLONOSCOPY WITH PROPOFOL  N/A 02/13/2020   Procedure: COLONOSCOPY WITH PROPOFOL ;  Surgeon: Janalyn Keene NOVAK, MD;  Location: [facility] ENDOSCOPY;  Service: Endoscopy;  Laterality: N/A;   CORONARY ANGIOPLASTY WITH STENT PLACEMENT     CORONARY PRESSURE/FFR STUDY N/A 12/04/2019   Procedure: INTRAVASCULAR PRESSURE WIRE/FFR STUDY;  Surgeon: Wonda Sharper, MD;  Location: [facility] INVASIVE CV LAB;  Service: Cardiovascular;  Laterality: N/A;   ESOPHAGOGASTRODUODENOSCOPY (EGD) WITH PROPOFOL  N/A 01/05/2017   Procedure: ESOPHAGOGASTRODUODENOSCOPY (EGD) WITH PROPOFOL ;  Surgeon: Therisa Bi, MD;  Location: [facility] ENDOSCOPY;  Service: Endoscopy;  Laterality: N/A;   ESOPHAGOGASTRODUODENOSCOPY (EGD) WITH PROPOFOL  N/A 02/13/2020   Procedure: ESOPHAGOGASTRODUODENOSCOPY (EGD) WITH PROPOFOL ;  Surgeon: Janalyn Keene NOVAK, MD;  Location: [facility] ENDOSCOPY;  Service: Endoscopy;  Laterality: N/A;   KNEE ARTHROSCOPY WITH MEDIAL MENISECTOMY Right 09/05/2017   Procedure: KNEE ARTHROSCOPY WITH MEDIAL AND LATERAL  MENISECTOMY PARTIAL SYNOVECTOMY;  Surgeon: Kathlynn Sharper, MD;  Location: [facility] ORS;  Service: Orthopedics;  Laterality: Right;   LEFT HEART CATH AND CORONARY ANGIOGRAPHY N/A 12/04/2019   Procedure: LEFT HEART CATH AND CORONARY ANGIOGRAPHY;  Surgeon: Wonda Sharper, MD;  Location: [facility] INVASIVE CV LAB;  Service: Cardiovascular;  Laterality: N/A;   LEFT HEART CATH AND CORONARY ANGIOGRAPHY Left 11/25/2022   Procedure: LEFT HEART CATH AND CORONARY ANGIOGRAPHY;  Surgeon: Bathe Denyse DELENA, MD;   Location: [facility] INVASIVE CV LAB;  Service: Cardiovascular;  Laterality: Left;   REVERSE SHOULDER ARTHROPLASTY Right 08/31/2023   Procedure: RIGHT REVERSE SHOULDER ARTHROPLASTY;  Surgeon: [name], MD;  Location: [facility] OR;  Service: Orthopedics;  Laterality: Right;   SHOULDER SURGERY Right 04/09/2012   SPINE SURGERY     TOTAL KNEE ARTHROPLASTY Right 01/19/2021   Procedure: TOTAL KNEE ARTHROPLASTY - [place]ford Amber to Assist;  Surgeon: Kathlynn Sharper, MD;  Location: [facility] ORS;  Service: Orthopedics;  Laterality: Right;    Family History  Problem Relation Age of Onset   Osteoarthritis Mother    Heart disease Mother    Hypertension Mother    Depression Mother    Heart disease Father    Early death Father  Hypertension Father    Heart attack Father    Hypertension Sister    Parkinson's disease Maternal Grandfather    Prostate cancer Neg Hx    Bladder Cancer Neg Hx    Kidney cancer Neg Hx    Tremor Neg Hx     Social History Social History   Tobacco Use   Smoking status: Every Day    Current packs/day: 1.50    Average packs/day: 1.5 packs/day for 42.5 years (63.8 ttl pk-yrs)    Types: Cigarettes    Start date: 10/26/1978    Last attempt to quit: 2000    Passive exposure: Current   Smokeless tobacco: Never   Tobacco comments:    Smokes 0.5 PPD - khj 04/17/2024        Started smoking at 63 yrs old    Smoked 1.5 PPD at his heaviest        States he quit 04/17/24  Vaping Use   Vaping status: Never Used  Substance Use Topics   Alcohol  use: Not Currently    Alcohol /week: 3.0 standard drinks of alcohol     Types: 3 Cans of beer per week    Comment: occassionally    Drug use: Not Currently    Types: Cocaine, Marijuana    Comment: last on saturday    Current Outpatient Medications  Medication Sig Dispense Refill   acetaminophen  (TYLENOL ) 325 MG tablet Take 1 tablet (325 mg total) by mouth every 6 (six) hours as needed for mild pain (pain score 1-3) (or temp > 100.5). 60 tablet  0   albuterol  (VENTOLIN  HFA) 108 (90 Base) MCG/ACT inhaler Inhale 2 puffs into the lungs every 6 (six) hours as needed for wheezing or shortness of breath. 18 g 2   amoxicillin -clavulanate (AUGMENTIN ) 875-125 MG tablet Take 1 tablet by mouth 2 (two) times daily. 20 tablet 0   aspirin  EC 81 MG tablet Take 1 tablet (81 mg total) by mouth daily. Swallow whole. 30 tablet 0   atorvastatin  (LIPITOR ) 80 MG tablet TAKE 1 TABLET BY MOUTH EVERY DAY 90 tablet 3   benzonatate  (TESSALON ) 200 MG capsule Take 1 capsule (200 mg total) by mouth 2 (two) times daily as needed for cough. 20 capsule 0   budesonide -glycopyrrolate -formoterol  (BREZTRI  AEROSPHERE) 160-9-4.8 MCG/ACT AERO inhaler Inhale 2 puffs into the lungs in the morning and at bedtime. 1 each 2   celecoxib  (CELEBREX ) 100 MG capsule TAKE 2 CAPSULES BY MOUTH TWICE A DAY 60 capsule 1   divalproex  (DEPAKOTE ) 500 MG DR tablet Take half tablet in the morning and 3 tablets at bedtime 105 tablet 1   DULoxetine  (CYMBALTA ) 30 MG capsule Take 3 capsules (90 mg total) by mouth daily. 90 capsule 1   gabapentin  (NEURONTIN ) 600 MG tablet Take 1 tablet (600 mg total) by mouth daily AND 2 tablets (1,200 mg total) at bedtime. 90 tablet 1   hydrOXYzine  (ATARAX ) 25 MG tablet Take 1 tablet (25 mg total) by mouth 2 (two) times daily as needed. 120 tablet 0   isosorbide  mononitrate (IMDUR ) 30 MG 24 hr tablet TAKE 1 TABLET BY MOUTH 2 TIMES DAILY. 180 tablet 1   Multiple Vitamins-Minerals (MENS 50+ MULTIVITAMIN) TABS Take 1 tablet by mouth in the morning.     nicotine  (NICODERM CQ  - DOSED IN MG/24 HOURS) 21 mg/24hr patch Place 1 patch (21 mg total) onto the skin daily. 42 patch 0   nicotine  polacrilex (NICOTINE  MINI) 2 MG lozenge Take 1 lozenge (2 mg total) by mouth every  2 (two) hours as needed for smoking cessation. 72 lozenge 3   nitroGLYCERIN  (NITROSTAT ) 0.4 MG SL tablet Place 1 tablet (0.4 mg total) under the tongue every 5 (five) minutes as needed for chest pain. 30 tablet 0    pantoprazole  (PROTONIX ) 40 MG tablet Take 1 tablet (40 mg total) by mouth daily. (Patient taking differently: Take 40 mg by mouth in the morning.) 90 tablet 0   predniSONE  (DELTASONE ) 20 MG tablet Take 1 tablet (20 mg total) by mouth daily with breakfast. 5 tablet 0   QUEtiapine  (SEROQUEL ) 200 MG tablet Take 1 tablet (200 mg total) by mouth at bedtime. 60 tablet 0   traZODone  (DESYREL ) 100 MG tablet Take 0.5 tablets (50 mg total) by mouth at bedtime for 7 days. 3.5 tablet 0   No current facility-administered medications for this visit.    Allergies  Allergen Reactions   Asenapine Other (See Comments) and Nausea And Vomiting    Increased tremors   Dextromethorphan Hbr    Guaifenesin     Latuda [Lurasidone Hcl] Other (See Comments)    Tremors     Lurasidone Other (See Comments) and Hives    Other Reaction(s): Angioedema   Phenylephrine      Review of Systems  Constitutional:  Positive for unexpected weight change (Weight gain unspecified). Negative for activity change and fatigue.  HENT:  Positive for dental problem and hearing loss.   Eyes:  Negative for visual disturbance.  Respiratory:  Positive for cough, shortness of breath and wheezing.   Cardiovascular:  Positive for chest pain.  Gastrointestinal:  Positive for abdominal pain (Reflux). Negative for abdominal distention.  Musculoskeletal:  Positive for arthralgias and back pain.  Neurological:  Positive for dizziness, numbness and headaches. Negative for weakness.  Hematological:  Bruises/bleeds easily.  Psychiatric/Behavioral:  Positive for dysphoric mood. The patient is nervous/anxious.   All other systems reviewed and are negative.   BP 126/77 (BP Location: Right Arm, Patient Position: Sitting, Cuff Size: Large)   Pulse (!) 56   Resp 20   Ht 6' 3 (1.905 m)   Wt 249 lb 4.8 oz (113.1 kg)   SpO2 96% Comment: RA  BMI 31.16 kg/m  Physical Exam Vitals reviewed.  Constitutional:      General: He is not in acute  distress.    Appearance: Normal appearance.  HENT:     Head: Normocephalic and atraumatic.     Ears:     Comments: Hard of hearing    Mouth/Throat:     Comments: Dentures Eyes:     General: No scleral icterus. Cardiovascular:     Rate and Rhythm: Normal rate and regular rhythm.     Heart sounds: Normal heart sounds. No murmur heard.    No friction rub. No gallop.  Pulmonary:     Effort: Pulmonary effort is normal. No respiratory distress.     Breath sounds: Wheezing present.  Abdominal:     General: There is no distension.     Palpations: Abdomen is soft.     Tenderness: There is no abdominal tenderness.  Musculoskeletal:     Cervical back: Neck supple.  Lymphadenopathy:     Cervical: No cervical adenopathy.  Skin:    General: Skin is warm and dry.  Neurological:     General: No focal deficit present.     Mental Status: He is alert and oriented to person, place, and time.     Cranial Nerves: No cranial nerve deficit.     Motor: No  weakness.    Diagnostic Tests: NUCLEAR MEDICINE PET SKULL BASE TO THIGH   TECHNIQUE: 11.7 mCi F-18 FDG was injected intravenously. Full-ring PET imaging was performed from the skull base to thigh after the radiotracer. CT data was obtained and used for attenuation correction and anatomic localization.   Fasting blood glucose: 104 mg/dl   COMPARISON:  CT chest 02/29/2024 and 11/28/2023.   FINDINGS: Mediastinal blood pool activity: SUV max 2.4   Liver activity: SUV max NA   NECK:   Hypermetabolic left level II lymph node measures 8 mm (6/22), SUV max 4.7.   Incidental CT findings:   None.   CHEST:   Spiculated subpleural anterolateral right upper lobe nodule measures approximately 0.6 x 1.4 cm (6/50), SUV max 7.6. No additional abnormal hypermetabolism.   Incidental CT findings:   Atherosclerotic calcification of the aorta and coronary arteries. Heart is enlarged. No pericardial effusion. Centrilobular and paraseptal  emphysema.   ABDOMEN/PELVIS:   No abnormal hypermetabolism.   Incidental CT findings:   Small right hepatic lobe cyst. Cholecystectomy. There may be a small low-attenuation lesion in the lower pole right kidney. No specific follow-up necessary.   SKELETON:   No abnormal hypermetabolism.   Incidental CT findings:   Postoperative changes in the lumbar spine. Degenerative changes in the spine.   IMPRESSION: 1. 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 2. Hypermetabolic small left level II lymph node is nonspecific. Recommend attention on follow-up. 3. Aortic atherosclerosis (ICD10-I70.0). Coronary artery calcification. 4.  Emphysema (ICD10-J43.9).     Electronically Signed   By: Newell Eke M.D.   On: 04/02/2024 13:58   I personally reviewed the CT and PET/CT images.  There is a 6 x 14 mm subpleural nodule in the right upper lobe.  Hypermetabolic on PET.  No hypermetabolic mediastinal or hilar adenopathy.  Aortic and coronary atherosclerosis.  Emphysema, upper lobe predominant.  Echocardiogram 03/21/2024 IMPRESSIONS     1. Left ventricular ejection fraction, by estimation, is 60 to 65%. The  left ventricle has normal function. The left ventricle has no regional  wall motion abnormalities. Left ventricular diastolic parameters were  normal.   2. Right ventricular systolic function is normal. The right ventricular  size is normal.   3. The mitral valve is normal in structure. No evidence of mitral valve  regurgitation. No evidence of mitral stenosis.   4. The aortic valve is normal in structure. Aortic valve regurgitation is  mild. No aortic stenosis is present.   5. Aortic dilatation noted. There is dilatation of the ascending aorta,  measuring 40 mm. There is dilatation of the aortic root, measuring 40 mm.   6. The inferior vena cava is dilated in size with <50% respiratory  variability, suggesting  right atrial pressure of 15 mmHg.   Pulmonary function testing 04/17/2024 FVC 3.58 (64%) FEV1 2.83 (67%) FEV1 3.08 (73%) postbronchodilator DLCO 26.03 (83%)  Impression: Keith Gibbs is a 63 year old man with past medical history significant for bipolar 1 disorder, schizophrenia, polysubstance abuse, CAD, mild to moderate aortic insufficiency, hypertension, hyperlipidemia, MI, hepatitis C, numerous orthopedic and spine surgeries, chronic pain, tobacco abuse, and COPD.    Right upper lobe lung nodule-peripheral nodule that has slowly increased in size over serial exams and is hypermetabolic on PET.  Findings consistent with a new primary bronchogenic carcinoma, clinical stage Ia (T1, N0).  Infectious inflammatory nodules are also in the differential diagnosis.  Options include continued radiographic observation-not advisable given growth  and metabolic activity.  Bronchoscopic biopsy and stereotactic radiation versus surgical resection of the 2 primary options.  We discussed both options.  We discussed advantages and disadvantages of each approach.  He had some misconceptions regarding radiation which I tried to clear up.  I encouraged him to consider radiation oncology consultation to discuss that further, but he very strongly wants to proceed with surgical resection.  I described the proposed operative procedure to him.  The plan would be to do a robotic right VATS for wedge resection and then proceed with a right upper lobectomy if positive.  He has adequate pulmonary reserve to tolerate a lobectomy and has severe emphysematous changes in the upper lobe so I do not think trying to preserve that would be beneficial.  I informed Keith Gibbs and his mother of the general nature of the procedure including the need for general anesthesia, the incisions to be used, the use of the surgical robot, the intraoperative decision making, the possible need for conversion to open, use of a drainage tube  postoperatively, the expected hospital stay, and the overall recovery.  I informed him of the indications, risks, benefits, and alternatives.  They understand the risks include, but not limited to death, MI, DVT, PE, bleeding, possible need for transfusion, possible need for conversion to open, infection, prolonged air leak, cardiac arrhythmias, respiratory failure, renal failure, as well as possibility of other unforeseeable complications.  I suspect pain will be a significant issue for him given his chronic pain at various other sites.  I strongly encouraged him to consider radiation to avoid that but he adamantly wants to proceed with surgery.  CAD-stent to LAD in 2017.  Repeat cath in 2024.  No hemodynamically significant stenoses.  Ascending aneurysm-4 cm.  Needs annual follow-up.  Can be done in conjunction with his lung cancer follow-up.  Chronic pain-currently managed with gabapentin  and Tylenol .  Not currently taking narcotics.  He is in a 12-step program for polysubstance abuse.  Emphasized that he will likely need narcotics postoperatively but will need to wean off those as quickly as possible.  Plan: Robotic assisted right upper lobe wedge resection, possible right upper lobectomy on Thursday, 05/09/2024  [name], MD [facility] [phone]

## 2024-04-30 NOTE — H&P (View-Only) (Signed)
 PCP is Lorren Greig PARAS, NP Referring Provider is Isadora Hose, MD  Chief Complaint  Patient presents with   Lung Lesion    New patient consultation, PET 9/8, Chest CT 5/6, PFTs 04/17/24    HPI:[name] a symptom consultation regarding a right upper lobe lung nodule.  Keith Gibbs is a 63 year old man with past medical history significant for bipolar 1 disorder, schizophrenia, polysubstance abuse, CAD, mild to moderate aortic insufficiency, hypertension, hyperlipidemia, MI, hepatitis C, numerous orthopedic and spine surgeries, chronic pain, tobacco abuse, and COPD.  He smoked about a half of cigarettes for 45 years prior to quitting on 04/17/2024.  He was admitted to the hospital in August with a COPD exacerbation.  Had presented with chest heaviness but cardiac causes were ruled out.  Treated with antibiotics and steroids.  He had a CT which showed an enlarging right upper lobe nodule.  He saw Dr. Isadora.  A PET/CT showed the nodule is hypermetabolic.  There was no evidence of regional or distant metastatic disease.  There was a left level 2 lymph node in the neck that was mildly hypermetabolic.  As noted he quit smoking about 2 weeks ago.  Still has coughing and wheezing.  Activities are largely limited by pain and shortness of breath.  Not having any consistent anginal type pain with activity.  Was told by cardiology that his heart was strong enough for surgery.  Zubrod Score: At the time of surgery this patient's most appropriate activity status/level should be described as: []     0    Normal activity, no symptoms [x]     1    Restricted in physical strenuous activity but ambulatory, able to do out light work []     2    Ambulatory and capable of self care, unable to do work activities, up and about >50 % of waking hours                              []     3    Only limited self care, in bed greater than 50% of waking hours []     4    Completely disabled, no self care, confined to bed or  chair []     5    Moribund  Past Medical History:  Diagnosis Date   Anginal pain    Anxiety    Arthritis    Bipolar 1 disorder (HCC)    Bursitis    CAD (coronary artery disease)    Chronic pain    COPD (chronic obstructive pulmonary disease) (HCC)    Current use of long term anticoagulation    DAPT (ASA + clopidogrel )   Depression    Diverticulitis    Dyspnea    GERD (gastroesophageal reflux disease)    Grade I diastolic dysfunction    Hepatitis C 2012   No longer has Hep C   HLD (hyperlipidemia)    Hypertension    MI (myocardial infarction) (HCC)    Polysubstance abuse (HCC)    cocaine, marijuana, ETOH   PUD (peptic ulcer disease)    S/P angioplasty with stent 06/10/2016   a.) 90% stenosis of pLAD to mLAD - 2.5 x 18 mm Xience Alpine (DES x 1) placed to pLAD   S/P PTCA (percutaneous transluminal coronary angioplasty) 12/04/2019   a.) 60% in stent restenosis of DES to pLAD; LVEF 65%.   Schizophrenia (HCC)    Stroke [facility])    Valvular  insufficiency    a.) Mild MR, TR, PR; mild to moderate AR on 03/05/2018 TTE    Past Surgical History:  Procedure Laterality Date   ABDOMINAL SURGERY     removed small piece of intestines due to [facility] Diverticulosis   ANTERIOR LAT LUMBAR FUSION N/A 08/23/2022   Procedure: DIRECT LATERAL INTERBODY FUSION  LUMBAR TWO- LUMBAR THREE, LUMBAR THREE-LUMBAR FOUR , EXPLORE AND EXTEND FUSION LUMBAR TWO-LUMBAR FIVE, POSTERIOR DECOMPRESSION LUMBAR THREE-LUMBAR FOUR, LEFT LUMBAR TWO-LUMBAR THREE;  Surgeon: Debby Dorn MATSU, MD;  Location: [facility] OR;  Service: Neurosurgery;  Laterality: N/A;   APPENDECTOMY     BACK SURGERY     CARDIAC CATHETERIZATION Left 06/10/2016   Procedure: Left Heart Cath and Coronary Angiography;  Surgeon: Denyse DELENA Bathe, MD;  Location: [facility] INVASIVE CV LAB;  Service: Cardiovascular;  Laterality: Left;   CARDIAC CATHETERIZATION N/A 06/10/2016   Procedure: Coronary Stent Intervention;  Surgeon: Cara JONETTA Lovelace, MD;  Location: [facility]  INVASIVE CV LAB;  Service: Cardiovascular;  Laterality: N/A;   CHOLECYSTECTOMY N/A 02/17/2022   Procedure: LAPAROSCOPIC CHOLECYSTECTOMY;  Surgeon: Stevie Herlene Righter, MD;  Location: [facility] OR;  Service: General;  Laterality: N/A;   COLON SURGERY     COLONOSCOPY     COLONOSCOPY WITH PROPOFOL  N/A 01/05/2017   Procedure: COLONOSCOPY WITH PROPOFOL ;  Surgeon: Therisa Bi, MD;  Location: [facility] ENDOSCOPY;  Service: Endoscopy;  Laterality: N/A;   COLONOSCOPY WITH PROPOFOL  N/A 02/13/2020   Procedure: COLONOSCOPY WITH PROPOFOL ;  Surgeon: Janalyn Keene NOVAK, MD;  Location: [facility] ENDOSCOPY;  Service: Endoscopy;  Laterality: N/A;   CORONARY ANGIOPLASTY WITH STENT PLACEMENT     CORONARY PRESSURE/FFR STUDY N/A 12/04/2019   Procedure: INTRAVASCULAR PRESSURE WIRE/FFR STUDY;  Surgeon: Wonda Sharper, MD;  Location: [facility] INVASIVE CV LAB;  Service: Cardiovascular;  Laterality: N/A;   ESOPHAGOGASTRODUODENOSCOPY (EGD) WITH PROPOFOL  N/A 01/05/2017   Procedure: ESOPHAGOGASTRODUODENOSCOPY (EGD) WITH PROPOFOL ;  Surgeon: Therisa Bi, MD;  Location: [facility] ENDOSCOPY;  Service: Endoscopy;  Laterality: N/A;   ESOPHAGOGASTRODUODENOSCOPY (EGD) WITH PROPOFOL  N/A 02/13/2020   Procedure: ESOPHAGOGASTRODUODENOSCOPY (EGD) WITH PROPOFOL ;  Surgeon: Janalyn Keene NOVAK, MD;  Location: [facility] ENDOSCOPY;  Service: Endoscopy;  Laterality: N/A;   KNEE ARTHROSCOPY WITH MEDIAL MENISECTOMY Right 09/05/2017   Procedure: KNEE ARTHROSCOPY WITH MEDIAL AND LATERAL  MENISECTOMY PARTIAL SYNOVECTOMY;  Surgeon: Kathlynn Sharper, MD;  Location: [facility] ORS;  Service: Orthopedics;  Laterality: Right;   LEFT HEART CATH AND CORONARY ANGIOGRAPHY N/A 12/04/2019   Procedure: LEFT HEART CATH AND CORONARY ANGIOGRAPHY;  Surgeon: Wonda Sharper, MD;  Location: [facility] INVASIVE CV LAB;  Service: Cardiovascular;  Laterality: N/A;   LEFT HEART CATH AND CORONARY ANGIOGRAPHY Left 11/25/2022   Procedure: LEFT HEART CATH AND CORONARY ANGIOGRAPHY;  Surgeon: Bathe Denyse DELENA, MD;   Location: [facility] INVASIVE CV LAB;  Service: Cardiovascular;  Laterality: Left;   REVERSE SHOULDER ARTHROPLASTY Right 08/31/2023   Procedure: RIGHT REVERSE SHOULDER ARTHROPLASTY;  Surgeon: [name], MD;  Location: [facility] OR;  Service: Orthopedics;  Laterality: Right;   SHOULDER SURGERY Right 04/09/2012   SPINE SURGERY     TOTAL KNEE ARTHROPLASTY Right 01/19/2021   Procedure: TOTAL KNEE ARTHROPLASTY - [place] Amber to Assist;  Surgeon: Kathlynn Sharper, MD;  Location: [facility] ORS;  Service: Orthopedics;  Laterality: Right;    Family History  Problem Relation Age of Onset   Osteoarthritis Mother    Heart disease Mother    Hypertension Mother    Depression Mother    Heart disease Father    Early death Father  Hypertension Father    Heart attack Father    Hypertension Sister    Parkinson's disease Maternal Grandfather    Prostate cancer Neg Hx    Bladder Cancer Neg Hx    Kidney cancer Neg Hx    Tremor Neg Hx     Social History Social History   Tobacco Use   Smoking status: Every Day    Current packs/day: 1.50    Average packs/day: 1.5 packs/day for 42.5 years (63.8 ttl pk-yrs)    Types: Cigarettes    Start date: 10/26/1978    Last attempt to quit: 2000    Passive exposure: Current   Smokeless tobacco: Never   Tobacco comments:    Smokes 0.5 PPD - khj 04/17/2024        Started smoking at 63 yrs old    Smoked 1.5 PPD at his heaviest        States he quit 04/17/24  Vaping Use   Vaping status: Never Used  Substance Use Topics   Alcohol  use: Not Currently    Alcohol /week: 3.0 standard drinks of alcohol     Types: 3 Cans of beer per week    Comment: occassionally    Drug use: Not Currently    Types: Cocaine, Marijuana    Comment: last on saturday    Current Outpatient Medications  Medication Sig Dispense Refill   acetaminophen  (TYLENOL ) 325 MG tablet Take 1 tablet (325 mg total) by mouth every 6 (six) hours as needed for mild pain (pain score 1-3) (or temp > 100.5). 60 tablet  0   albuterol  (VENTOLIN  HFA) 108 (90 Base) MCG/ACT inhaler Inhale 2 puffs into the lungs every 6 (six) hours as needed for wheezing or shortness of breath. 18 g 2   amoxicillin -clavulanate (AUGMENTIN ) 875-125 MG tablet Take 1 tablet by mouth 2 (two) times daily. 20 tablet 0   aspirin  EC 81 MG tablet Take 1 tablet (81 mg total) by mouth daily. Swallow whole. 30 tablet 0   atorvastatin  (LIPITOR ) 80 MG tablet TAKE 1 TABLET BY MOUTH EVERY DAY 90 tablet 3   benzonatate  (TESSALON ) 200 MG capsule Take 1 capsule (200 mg total) by mouth 2 (two) times daily as needed for cough. 20 capsule 0   budesonide -glycopyrrolate -formoterol  (BREZTRI  AEROSPHERE) 160-9-4.8 MCG/ACT AERO inhaler Inhale 2 puffs into the lungs in the morning and at bedtime. 1 each 2   celecoxib  (CELEBREX ) 100 MG capsule TAKE 2 CAPSULES BY MOUTH TWICE A DAY 60 capsule 1   divalproex  (DEPAKOTE ) 500 MG DR tablet Take half tablet in the morning and 3 tablets at bedtime 105 tablet 1   DULoxetine  (CYMBALTA ) 30 MG capsule Take 3 capsules (90 mg total) by mouth daily. 90 capsule 1   gabapentin  (NEURONTIN ) 600 MG tablet Take 1 tablet (600 mg total) by mouth daily AND 2 tablets (1,200 mg total) at bedtime. 90 tablet 1   hydrOXYzine  (ATARAX ) 25 MG tablet Take 1 tablet (25 mg total) by mouth 2 (two) times daily as needed. 120 tablet 0   isosorbide  mononitrate (IMDUR ) 30 MG 24 hr tablet TAKE 1 TABLET BY MOUTH 2 TIMES DAILY. 180 tablet 1   Multiple Vitamins-Minerals (MENS 50+ MULTIVITAMIN) TABS Take 1 tablet by mouth in the morning.     nicotine  (NICODERM CQ  - DOSED IN MG/24 HOURS) 21 mg/24hr patch Place 1 patch (21 mg total) onto the skin daily. 42 patch 0   nicotine  polacrilex (NICOTINE  MINI) 2 MG lozenge Take 1 lozenge (2 mg total) by mouth every  2 (two) hours as needed for smoking cessation. 72 lozenge 3   nitroGLYCERIN  (NITROSTAT ) 0.4 MG SL tablet Place 1 tablet (0.4 mg total) under the tongue every 5 (five) minutes as needed for chest pain. 30 tablet 0    pantoprazole  (PROTONIX ) 40 MG tablet Take 1 tablet (40 mg total) by mouth daily. (Patient taking differently: Take 40 mg by mouth in the morning.) 90 tablet 0   predniSONE  (DELTASONE ) 20 MG tablet Take 1 tablet (20 mg total) by mouth daily with breakfast. 5 tablet 0   QUEtiapine  (SEROQUEL ) 200 MG tablet Take 1 tablet (200 mg total) by mouth at bedtime. 60 tablet 0   traZODone  (DESYREL ) 100 MG tablet Take 0.5 tablets (50 mg total) by mouth at bedtime for 7 days. 3.5 tablet 0   No current facility-administered medications for this visit.    Allergies  Allergen Reactions   Asenapine Other (See Comments) and Nausea And Vomiting    Increased tremors   Dextromethorphan Hbr    Guaifenesin     Latuda [Lurasidone Hcl] Other (See Comments)    Tremors     Lurasidone Other (See Comments) and Hives    Other Reaction(s): Angioedema   Phenylephrine      Review of Systems  Constitutional:  Positive for unexpected weight change (Weight gain unspecified). Negative for activity change and fatigue.  HENT:  Positive for dental problem and hearing loss.   Eyes:  Negative for visual disturbance.  Respiratory:  Positive for cough, shortness of breath and wheezing.   Cardiovascular:  Positive for chest pain.  Gastrointestinal:  Positive for abdominal pain (Reflux). Negative for abdominal distention.  Musculoskeletal:  Positive for arthralgias and back pain.  Neurological:  Positive for dizziness, numbness and headaches. Negative for weakness.  Hematological:  Bruises/bleeds easily.  Psychiatric/Behavioral:  Positive for dysphoric mood. The patient is nervous/anxious.   All other systems reviewed and are negative.   BP 126/77 (BP Location: Right Arm, Patient Position: Sitting, Cuff Size: Large)   Pulse (!) 56   Resp 20   Ht 6' 3 (1.905 m)   Wt 249 lb 4.8 oz (113.1 kg)   SpO2 96% Comment: RA  BMI 31.16 kg/m  Physical Exam Vitals reviewed.  Constitutional:      General: He is not in acute  distress.    Appearance: Normal appearance.  HENT:     Head: Normocephalic and atraumatic.     Ears:     Comments: Hard of hearing    Mouth/Throat:     Comments: Dentures Eyes:     General: No scleral icterus. Cardiovascular:     Rate and Rhythm: Normal rate and regular rhythm.     Heart sounds: Normal heart sounds. No murmur heard.    No friction rub. No gallop.  Pulmonary:     Effort: Pulmonary effort is normal. No respiratory distress.     Breath sounds: Wheezing present.  Abdominal:     General: There is no distension.     Palpations: Abdomen is soft.     Tenderness: There is no abdominal tenderness.  Musculoskeletal:     Cervical back: Neck supple.  Lymphadenopathy:     Cervical: No cervical adenopathy.  Skin:    General: Skin is warm and dry.  Neurological:     General: No focal deficit present.     Mental Status: He is alert and oriented to person, place, and time.     Cranial Nerves: No cranial nerve deficit.     Motor: No  weakness.    Diagnostic Tests: NUCLEAR MEDICINE PET SKULL BASE TO THIGH   TECHNIQUE: 11.7 mCi F-18 FDG was injected intravenously. Full-ring PET imaging was performed from the skull base to thigh after the radiotracer. CT data was obtained and used for attenuation correction and anatomic localization.   Fasting blood glucose: 104 mg/dl   COMPARISON:  CT chest 02/29/2024 and 11/28/2023.   FINDINGS: Mediastinal blood pool activity: SUV max 2.4   Liver activity: SUV max NA   NECK:   Hypermetabolic left level II lymph node measures 8 mm (6/22), SUV max 4.7.   Incidental CT findings:   None.   CHEST:   Spiculated subpleural anterolateral right upper lobe nodule measures approximately 0.6 x 1.4 cm (6/50), SUV max 7.6. No additional abnormal hypermetabolism.   Incidental CT findings:   Atherosclerotic calcification of the aorta and coronary arteries. Heart is enlarged. No pericardial effusion. Centrilobular and paraseptal  emphysema.   ABDOMEN/PELVIS:   No abnormal hypermetabolism.   Incidental CT findings:   Small right hepatic lobe cyst. Cholecystectomy. There may be a small low-attenuation lesion in the lower pole right kidney. No specific follow-up necessary.   SKELETON:   No abnormal hypermetabolism.   Incidental CT findings:   Postoperative changes in the lumbar spine. Degenerative changes in the spine.   IMPRESSION: 1. 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 2. Hypermetabolic small left level II lymph node is nonspecific. Recommend attention on follow-up. 3. Aortic atherosclerosis (ICD10-I70.0). Coronary artery calcification. 4.  Emphysema (ICD10-J43.9).     Electronically Signed   By: Newell Eke M.D.   On: 04/02/2024 13:58   I personally reviewed the CT and PET/CT images.  There is a 6 x 14 mm subpleural nodule in the right upper lobe.  Hypermetabolic on PET.  No hypermetabolic mediastinal or hilar adenopathy.  Aortic and coronary atherosclerosis.  Emphysema, upper lobe predominant.  Echocardiogram 03/21/2024 IMPRESSIONS     1. Left ventricular ejection fraction, by estimation, is 60 to 65%. The  left ventricle has normal function. The left ventricle has no regional  wall motion abnormalities. Left ventricular diastolic parameters were  normal.   2. Right ventricular systolic function is normal. The right ventricular  size is normal.   3. The mitral valve is normal in structure. No evidence of mitral valve  regurgitation. No evidence of mitral stenosis.   4. The aortic valve is normal in structure. Aortic valve regurgitation is  mild. No aortic stenosis is present.   5. Aortic dilatation noted. There is dilatation of the ascending aorta,  measuring 40 mm. There is dilatation of the aortic root, measuring 40 mm.   6. The inferior vena cava is dilated in size with <50% respiratory  variability, suggesting  right atrial pressure of 15 mmHg.   Pulmonary function testing 04/17/2024 FVC 3.58 (64%) FEV1 2.83 (67%) FEV1 3.08 (73%) postbronchodilator DLCO 26.03 (83%)  Impression: Rivaldo Hineman is a 63 year old man with past medical history significant for bipolar 1 disorder, schizophrenia, polysubstance abuse, CAD, mild to moderate aortic insufficiency, hypertension, hyperlipidemia, MI, hepatitis C, numerous orthopedic and spine surgeries, chronic pain, tobacco abuse, and COPD.    Right upper lobe lung nodule-peripheral nodule that has slowly increased in size over serial exams and is hypermetabolic on PET.  Findings consistent with a new primary bronchogenic carcinoma, clinical stage Ia (T1, N0).  Infectious inflammatory nodules are also in the differential diagnosis.  Options include continued radiographic observation-not advisable given growth  and metabolic activity.  Bronchoscopic biopsy and stereotactic radiation versus surgical resection of the 2 primary options.  We discussed both options.  We discussed advantages and disadvantages of each approach.  He had some misconceptions regarding radiation which I tried to clear up.  I encouraged him to consider radiation oncology consultation to discuss that further, but he very strongly wants to proceed with surgical resection.  I described the proposed operative procedure to him.  The plan would be to do a robotic right VATS for wedge resection and then proceed with a right upper lobectomy if positive.  He has adequate pulmonary reserve to tolerate a lobectomy and has severe emphysematous changes in the upper lobe so I do not think trying to preserve that would be beneficial.  I informed Mr. Mares and his mother of the general nature of the procedure including the need for general anesthesia, the incisions to be used, the use of the surgical robot, the intraoperative decision making, the possible need for conversion to open, use of a drainage tube  postoperatively, the expected hospital stay, and the overall recovery.  I informed him of the indications, risks, benefits, and alternatives.  They understand the risks include, but not limited to death, MI, DVT, PE, bleeding, possible need for transfusion, possible need for conversion to open, infection, prolonged air leak, cardiac arrhythmias, respiratory failure, renal failure, as well as possibility of other unforeseeable complications.  I suspect pain will be a significant issue for him given his chronic pain at various other sites.  I strongly encouraged him to consider radiation to avoid that but he adamantly wants to proceed with surgery.  CAD-stent to LAD in 2017.  Repeat cath in 2024.  No hemodynamically significant stenoses.  Ascending aneurysm-4 cm.  Needs annual follow-up.  Can be done in conjunction with his lung cancer follow-up.  Chronic pain-currently managed with gabapentin  and Tylenol .  Not currently taking narcotics.  He is in a 12-step program for polysubstance abuse.  Emphasized that he will likely need narcotics postoperatively but will need to wean off those as quickly as possible.  Plan: Robotic assisted right upper lobe wedge resection, possible right upper lobectomy on Thursday, 05/09/2024  [name], MD [facility] [phone]

## 2024-05-01 ENCOUNTER — Ambulatory Visit (INDEPENDENT_AMBULATORY_CARE_PROVIDER_SITE_OTHER): Payer: MEDICAID

## 2024-05-01 DIAGNOSIS — F250 Schizoaffective disorder, bipolar type: Secondary | ICD-10-CM

## 2024-05-01 DIAGNOSIS — F1420 Cocaine dependence, uncomplicated: Secondary | ICD-10-CM

## 2024-05-01 DIAGNOSIS — F1220 Cannabis dependence, uncomplicated: Secondary | ICD-10-CM

## 2024-05-01 DIAGNOSIS — F1011 Alcohol abuse, in remission: Secondary | ICD-10-CM

## 2024-05-01 NOTE — Progress Notes (Addendum)
 Daily Group Progress Note   Program: [facility]     Group Time: 9 a.m. to 12 p.m.      Type of Therapy: Process and Psychoeducational    Topic: The therapists check in with group members, assess for SI/HI/psychosis and overall level of functioning. The therapists inquire about sobriety date and number of community support meetings attended since last session.   Therapists discuss the following issues today: how the disease of addition lies trying to convince the addict that they can use and it will not hurt them,how to recognize and hear the disease, importance of putting roadblocks in the way of using, how addiction makes a rationalization for one's use, best course of action involves living honestly. Hanging out with people that are conducive to our recovery.       Summary: Hallie presents today rating his depression as a 7 and anxiety as a 6. He reports he is attending meetings and has a sponsor. He identifies his emotions as anxious and scared.  He shares with the group that he goes for pre-op for 05-07-24 and for surgery on 05-09-24. He says they are going to remove the upper part of the lung where the cancer has localized. He says he will be in the hospital 3-4 days and they told him they are going to put him on post surgical pain medication.  He comments that he knows he can't be on group on this medication. Therapists explain it would be inhumane to expect a person to go through such a procedure and not be provided pain relief . Alain says he really does not have a choice but to have the surgery, as his medical team told him if he has the surgery, the cancer has zero percent chance of returning.  He shares he is terrified of the pain he will encounter. Again the therapist assure him his medical team will make sure he is not experience acute pain.  Adis does share that he stopped at the store and bought a 5 pack of cigarettes and smoked 2. He says he will probably go home and smoke the  rest. Therapists and peers discuss with him that that is his addiction talking to him, telling him that a few more cigarettes won't hurt him.  He notes the healthy part of himself says not to smoke. Therapists discuss how he is not going to be better off if he gives in to the addiction.   Asencion says he knows he can called people in the program if he feels like smoking. He notes his sponsor will be out of town this weekend.   Progress Towards Goals: Shann reports no change in his sobriety date.  UDS collected: no  Results:none  AA/NA attended?:  Yes   Sponsor?:  Yes   Darice Simpler, MS, LMFT, LCAS [id number], [place], [place], [facility], LCAS  05/01/2024

## 2024-05-03 ENCOUNTER — Ambulatory Visit (HOSPITAL_COMMUNITY): Payer: MEDICAID

## 2024-05-06 ENCOUNTER — Encounter (HOSPITAL_COMMUNITY): Payer: Self-pay

## 2024-05-06 ENCOUNTER — Ambulatory Visit (HOSPITAL_COMMUNITY): Payer: MEDICAID

## 2024-05-06 NOTE — Pre-Procedure Instructions (Signed)
 Surgical Instructions   Your procedure is scheduled on May 09, 2024. Report to [facility] Main Entrance A at 10:00 A.M., then check in with the Admitting office. Any questions or running late day of surgery: call [phone]  Questions prior to your surgery date: call [phone], Monday-Friday, 8am-4pm. If you experience any cold or flu symptoms such as cough, fever, chills, shortness of breath, etc. between now and your scheduled surgery, please notify us  at the above number.     Remember:  Do not eat or drink after midnight the night before your surgery    Take these medicines the morning of surgery with A SIP OF WATER : atorvastatin  (LIPITOR )  budesonide -glycopyrrolate -formoterol  (BREZTRI  AEROSPHERE) inhaler DULoxetine  (CYMBALTA )  gabapentin  (NEURONTIN )  hydrOXYzine  (ATARAX )  isosorbide  mononitrate (IMDUR )  pantoprazole  (PROTONIX )    May take these medicines IF NEEDED: acetaminophen  (TYLENOL )  albuterol  (VENTOLIN  HFA) inhaler - please bring inhaler with you morning of surgery nitroGLYCERIN  (NITROSTAT ) - if dose taken prior to surgery, please call [phone]   Continue taking your Aspirin  through the day before surgery. DO NOT take any the morning of surgery.   One week prior to surgery, STOP taking any Aleve , Naproxen , Ibuprofen , Motrin , Advil , Goody's, BC's, all herbal medications, fish oil, and non-prescription vitamins. This includes your medication: celecoxib  (CELEBREX )                      Do NOT Smoke (Tobacco/Vaping) for 24 hours prior to your procedure.  If you use a CPAP at night, you may bring your mask/headgear for your overnight stay.   You will be asked to remove any contacts, glasses, piercing's, hearing aid's, dentures/partials prior to surgery. Please bring cases for these items if needed.    Patients discharged the day of surgery will not be allowed to drive home, and someone needs to stay with them for 24 hours.  SURGICAL WAITING ROOM  VISITATION Patients may have no more than 2 support people in the waiting area - these visitors may rotate.   Pre-op nurse will coordinate an appropriate time for 1 ADULT support person, who may not rotate, to accompany patient in pre-op.  Children under the age of 84 must have an adult with them who is not the patient and must remain in the main waiting area with an adult.  If the patient needs to stay at the hospital during part of their recovery, the visitor guidelines for inpatient rooms apply.  Please refer to the [facility] website for the visitor guidelines for any additional information.   If you received a COVID test during your pre-op visit  it is requested that you wear a mask when out in public, stay away from anyone that may not be feeling well and notify your surgeon if you develop symptoms. If you have been in contact with anyone that has tested positive in the last 10 days please notify you surgeon.      Pre-operative CHG Bathing Instructions   You can play a key role in reducing the risk of infection after surgery. Your skin needs to be as free of germs as possible. You can reduce the number of germs on your skin by washing with CHG (chlorhexidine  gluconate) soap before surgery. CHG is an antiseptic soap that kills germs and continues to kill germs even after washing.   DO NOT use if you have an allergy  to chlorhexidine /CHG or antibacterial soaps. If your skin becomes reddened or irritated, stop using the CHG and notify  one of our RNs at [phone].              TAKE A SHOWER THE NIGHT BEFORE SURGERY   Please keep in mind the following:  DO NOT shave, including legs and underarms, 48 hours prior to surgery.   You may shave your face before/day of surgery.  Place clean sheets on your bed the night before surgery Use a clean washcloth (not used since being washed) for shower. DO NOT sleep with pet's night before surgery.  CHG Shower Instructions:  Wash your face and  private area with normal soap. If you choose to wash your hair, wash first with your normal shampoo.  After you use shampoo/soap, rinse your hair and body thoroughly to remove shampoo/soap residue.  Turn the water  OFF and apply half the bottle of CHG soap to a CLEAN washcloth.  Apply CHG soap ONLY FROM YOUR NECK DOWN TO YOUR TOES (washing for 3-5 minutes)  DO NOT use CHG soap on face, private areas, open wounds, or sores.  Pay special attention to the area where your surgery is being performed.  If you are having back surgery, having someone wash your back for you may be helpful. Wait 2 minutes after CHG soap is applied, then you may rinse off the CHG soap.  Pat dry with a clean towel  Put on clean pajamas    Additional instructions for the day of surgery: If you choose, you may shower the morning of surgery with an antibacterial soap.  DO NOT APPLY any lotions, deodorants, cologne, or perfumes.   Do not wear jewelry or makeup Do not wear nail polish, gel polish, artificial nails, or any other type of covering on natural nails (fingers and toes) Do not bring valuables to the hospital. [facility] is not responsible for valuables/personal belongings. Put on clean/comfortable clothes.  Please brush your teeth.  Ask your nurse before applying any prescription medications to the skin.

## 2024-05-07 ENCOUNTER — Encounter (HOSPITAL_COMMUNITY)
Admission: RE | Admit: 2024-05-07 | Discharge: 2024-05-07 | Disposition: A | Discharge status: Home / Self Care | Payer: MEDICAID | Source: Ambulatory Visit | Attending: Thoracic Surgery (Cardiothoracic Vascular Surgery) | Admitting: Thoracic Surgery (Cardiothoracic Vascular Surgery)

## 2024-05-07 ENCOUNTER — Ambulatory Visit (HOSPITAL_COMMUNITY)
Admission: RE | Admit: 2024-05-07 | Discharge: 2024-05-07 | Disposition: A | Discharge status: Home / Self Care | Payer: MEDICAID | Source: Ambulatory Visit | Attending: Thoracic Surgery (Cardiothoracic Vascular Surgery) | Admitting: Thoracic Surgery (Cardiothoracic Vascular Surgery)

## 2024-05-07 ENCOUNTER — Other Ambulatory Visit: Payer: Self-pay

## 2024-05-07 ENCOUNTER — Encounter (HOSPITAL_COMMUNITY): Payer: Self-pay

## 2024-05-07 VITALS — BP 131/76 | HR 54 | Temp 98.1°F | Resp 19 | Ht 75.0 in | Wt 246.0 lb

## 2024-05-07 DIAGNOSIS — I2510 Atherosclerotic heart disease of native coronary artery without angina pectoris: Secondary | ICD-10-CM

## 2024-05-07 DIAGNOSIS — Z01818 Encounter for other preprocedural examination: Secondary | ICD-10-CM | POA: Diagnosis present

## 2024-05-07 DIAGNOSIS — R911 Solitary pulmonary nodule: Secondary | ICD-10-CM | POA: Insufficient documentation

## 2024-05-07 HISTORY — DX: Tremor, unspecified: R25.1

## 2024-05-07 HISTORY — DX: Malignant (primary) neoplasm, unspecified: C80.1

## 2024-05-07 LAB — COMPREHENSIVE METABOLIC PANEL WITH GFR
ALT: 14 U/L (ref 0–44)
AST: 17 U/L (ref 15–41)
Albumin: 3.7 g/dL (ref 3.5–5.0)
Alkaline Phosphatase: 47 U/L (ref 38–126)
Anion gap: 8 (ref 5–15)
BUN: 15 mg/dL (ref 8–23)
CO2: 27 mmol/L (ref 22–32)
Calcium: 9.1 mg/dL (ref 8.9–10.3)
Chloride: 97 mmol/L — ABNORMAL LOW (ref 98–111)
Creatinine, Ser: 1.04 mg/dL (ref 0.61–1.24)
GFR, Estimated: >60 mL/min (ref >60)
Glucose, Bld: 86 mg/dL (ref 70–99)
Potassium: 4.4 mmol/L (ref 3.5–5.1)
Sodium: 132 mmol/L — ABNORMAL LOW (ref 135–145)
Total Bilirubin: 0.6 mg/dL (ref 0.0–1.2)
Total Protein: 6.6 g/dL (ref 6.5–8.1)

## 2024-05-07 LAB — URINALYSIS, ROUTINE W REFLEX MICROSCOPIC
Bacteria, UA: NONE SEEN
Bilirubin Urine: NEGATIVE
Glucose, UA: NEGATIVE mg/dL
Ketones, ur: NEGATIVE mg/dL
Leukocytes,Ua: NEGATIVE
Nitrite: NEGATIVE
Protein, ur: NEGATIVE mg/dL
Specific Gravity, Urine: 1.013 (ref 1.005–1.030)
pH: 6 (ref 5.0–8.0)

## 2024-05-07 LAB — CBC
HCT: 40.3 % (ref 39.0–52.0)
Hemoglobin: 13.8 g/dL (ref 13.0–17.0)
MCH: 32.9 pg (ref 26.0–34.0)
MCHC: 34.2 g/dL (ref 30.0–36.0)
MCV: 96.2 fL (ref 80.0–100.0)
Platelets: 156 K/uL (ref 150–400)
RBC: 4.19 MIL/uL — ABNORMAL LOW (ref 4.22–5.81)
RDW: 12.7 % (ref 11.5–15.5)
WBC: 6.7 K/uL (ref 4.0–10.5)
nRBC: 0 % (ref 0.0–0.2)

## 2024-05-07 LAB — PROTIME-INR
INR: 1 (ref 0.8–1.2)
Prothrombin Time: 14 s (ref 11.4–15.2)

## 2024-05-07 LAB — SURGICAL PCR SCREEN
MRSA, PCR: NEGATIVE
Staphylococcus aureus: POSITIVE — AB

## 2024-05-07 LAB — APTT: aPTT: 30 s (ref 24–36)

## 2024-05-07 NOTE — Progress Notes (Signed)
 PCP - Garnette No, NP Cardiologist - Fernand Alter, MD Pulmonologist- Isadora Hose, MD  PPM/ICD - denies Device Orders -n/a  Rep Notified - n/a  Chest x-ray -05/07/2024  EKG - 05/07/2024 Stress Test - 01/29/2024 ECHO - 03/21/2024 Cardiac Cath - 11/25/2022  Sleep Study - denies CPAP - n/a  Fasting Blood Sugar - no DM  Checks Blood Sugar _____ times a day  Last dose of GLP1 agonist-  n/a GLP1 instructions: n/a  Blood Thinner Instructions: n/a Aspirin  Instructions: no aspirin  on the DOS  ERAS Protcol - no PRE-SURGERY Ensure or G2- n/a  COVID TEST- n/a   Anesthesia review: yes, cardiac history, CAD, MI, HTN, CVA, COPD  Patient denies shortness of breath, fever, cough and chest pain at PAT appointment   All instructions explained to the patient, with a verbal understanding of the material. Patient agrees to go over the instructions while at home for a better understanding. Patient also instructed to self quarantine after being tested for COVID-19. The opportunity to ask questions was provided.

## 2024-05-08 ENCOUNTER — Ambulatory Visit (HOSPITAL_COMMUNITY): Payer: MEDICAID

## 2024-05-08 ENCOUNTER — Ambulatory Visit (INDEPENDENT_AMBULATORY_CARE_PROVIDER_SITE_OTHER): Payer: MEDICAID | Admitting: Licensed Clinical Social Worker

## 2024-05-08 DIAGNOSIS — F4310 Post-traumatic stress disorder, unspecified: Secondary | ICD-10-CM

## 2024-05-08 DIAGNOSIS — F1420 Cocaine dependence, uncomplicated: Secondary | ICD-10-CM

## 2024-05-08 DIAGNOSIS — F1220 Cannabis dependence, uncomplicated: Secondary | ICD-10-CM

## 2024-05-08 DIAGNOSIS — F411 Generalized anxiety disorder: Secondary | ICD-10-CM

## 2024-05-08 DIAGNOSIS — F1111 Opioid abuse, in remission: Secondary | ICD-10-CM

## 2024-05-08 DIAGNOSIS — F250 Schizoaffective disorder, bipolar type: Secondary | ICD-10-CM

## 2024-05-08 DIAGNOSIS — F1011 Alcohol abuse, in remission: Secondary | ICD-10-CM

## 2024-05-08 NOTE — Anesthesia Preprocedure Evaluation (Signed)
 Anesthesia Evaluation  Patient identified by MRN, date of birth, ID band Patient awake    Reviewed: Allergy  & Precautions, NPO status , Patient's Chart, lab work & pertinent test results  Airway Mallampati: II  TM Distance: >3 FB Neck ROM: Full    Dental  (+) Dental Advisory Given   Pulmonary shortness of breath, COPD,  COPD inhaler, Patient abstained from smoking., former smoker   breath sounds clear to auscultation       Cardiovascular hypertension, Pt. on medications and Pt. on home beta blockers + CAD, + Past MI and + Cardiac Stents   Rhythm:Regular Rate:Normal     Neuro/Psych  Neuromuscular disease CVA    GI/Hepatic PUD,GERD  ,,(+)     substance abuse  , Hepatitis -, C  Endo/Other  negative endocrine ROS    Renal/GU Renal disease     Musculoskeletal  (+) Arthritis ,    Abdominal   Peds  Hematology negative hematology ROS (+)   Anesthesia Other Findings   Reproductive/Obstetrics                              Anesthesia Physical Anesthesia Plan  ASA: 3  Anesthesia Plan: General   Post-op Pain Management: Tylenol  PO (pre-op)* and Ketamine  IV*   Induction: Intravenous  PONV Risk Score and Plan: 2 and Dexamethasone , Ondansetron , Midazolam  and Treatment may vary due to age or medical condition  Airway Management Planned: Double Lumen EBT  Additional Equipment: Arterial line, CVP and Ultrasound Guidance Line Placement  Intra-op Plan:   Post-operative Plan: Extubation in OR  Informed Consent: I have reviewed the patients History and Physical, chart, labs and discussed the procedure including the risks, benefits and alternatives for the proposed anesthesia with the patient or authorized representative who has indicated his/her understanding and acceptance.     Dental advisory given  Plan Discussed with: CRNA  Anesthesia Plan Comments: (PAT note by Lynwood Hope,  PA-C: 63 year old male follows with cardiologist Dr. Denyse Bathe for history of CAD (MI, s/p DES mid LAD 06/09/16; PTCA for moderate ISR 12/04/19),  mild aortic regurgitation, mild pulmonary hypertension. Cath 11/25/2022 showed patent LAD stent, LCX/RCA, and LVEF normal, medical treatment recommended. Following the cath, he had a nuclear stress test 03/13/23 which was interpreted by Dr. Bathe as showing, Ischemia in the RCA territory with borderline LV dysfunction, dilated LV. Advise CCTA.  He did not have any additional testing.  I previously reached out to Dr. Bathe about the stress test result in January 2025 as patient was pending right reverse shoulder arthroplasty.  Dr. Bathe advised patient was okay to proceed from cardiac standpoint; did not elaborate on stress results.  Since that time, Dr. Bathe ordered another nuclear stress test on 01/29/2024 which he interpreted as showing, There is mild small reversible defect in anterior apical wall, with mild LV systolic dysfunction.  Mild ischemia in LAD territory with LVEF 47% and sinus bradycardia 43/min.  Patient had follow-up with Dr. Bathe on 03/18/2024.  Stress test was not discussed.  Note states only, Lung nodule growing, no chest pain but has associated SOB.    Other pertinent history includes current smoker with associated COPD (maintained on Breztri ), CVA, GERD on PPI, hep C s/p treatment, EtOH abuse, polysubstance abuse, schizoaffective disorder, bipolar 1.  Recent admission 8/27 through 03/23/2024 for COPD with acute exacerbation.  He had some associated chest pain was noted to be bradycardic with heart rate in the  40s and cardiology was consulted.  Per consult note by Dr. Santo 03/22/2024, hsTroponins negative, CXR raised question of pulmonary edema but BNP was stone cold normal and echo shows normal LV function/normal RV function/normal diastolic function so suspect other process on CXR - suspect noncardiac, query related to enlarging  spiculated lung mass concerning for lung CA, COPD.   He was treated with steroids, antibiotics, inhalers, supplemental O2.  Wheezing improved and he was weaned to room air.  Outpatient follow-up was arranged for spiculated right lower lobe nodule.  Subsequent PET/CT showed the nodule to be hypermetabolic.  No evidence of regional or distant metastatic disease.  He was seen by Dr. Kerrin on 04/30/2024 and recommended to undergo robotic assisted right upper lobe wedge resection, possible right upper lobectomy.  Preop labs reviewed, mild hyponatremia sodium 132, otherwise unremarkable.  History reviewed with anesthesiologist Dr. Mallory, okay to proceed as planned barring acute status change.  EKG 05/07/2024: Sinus bradycardia.  Rate 51.  Pulmonary function testing 04/17/2024 FVC 3.58 (64%) FEV1 2.83 (67%) FEV1 3.08 (73%) postbronchodilator DLCO 26.03 (83%)  TTE 03/21/24: 1. Left ventricular ejection fraction, by estimation, is 60 to 65%. The  left ventricle has normal function. The left ventricle has no regional  wall motion abnormalities. Left ventricular diastolic parameters were  normal.  2. Right ventricular systolic function is normal. The right ventricular  size is normal.  3. The mitral valve is normal in structure. No evidence of mitral valve  regurgitation. No evidence of mitral stenosis.  4. The aortic valve is normal in structure. Aortic valve regurgitation is  mild. No aortic stenosis is present.  5. Aortic dilatation noted. There is dilatation of the ascending aorta,  measuring 40 mm. There is dilatation of the aortic root, measuring 40 mm.  6. The inferior vena cava is dilated in size with <50% respiratory  variability, suggesting right atrial pressure of 15 mmHg.   Nuclear stress test 01/29/24: There is mild small reversible defect in anterior apical wall, with mild LV systolic dysfunction.  Mild ischemia in LAD territory with LVEF 47% and sinus bradycardia  43/min.  Nuclear stress 03/08/2023: IMPRESSION: Ischemia in the RCA territory with borderline LV dysfunction, dilated LV. Advise CCTA.   Cath 11/25/2022:   Prox LAD to Mid LAD lesion is 25% stenosed.   The left ventricular systolic function is normal.   The left ventricular ejection fraction is greater than 65% by visual estimate.   There is no mitral valve regurgitation.  Stent LAD no restenosis, LCX/RCA, and LVEF normal, medical treatment.  Nuclear stress 11/01/2022: EKG RESULTS: NSR. 53/min. No significant ST changes with persantine . IMAGE QUALITY: Good PERFUSION/WALL MOTION FINDINGS: EF = 59%. Small mild reversible basal inferoseptal and inferior, mid inferoseptal, apical inferior and apex (17) wall defects, normal wall motion.    IMPRESSION: Equivocal stress test with normal LVEF.   Cath 12/04/2019:  Previously placed Mid LAD drug eluting stent is widely patent.  Balloon angioplasty was performed.  Prox LAD to Mid LAD lesion is 60% stenosed.  The left ventricular systolic function is normal.  LV end diastolic pressure is normal.  The left ventricular ejection fraction is greater than 65% by visual estimate.   1. Single vessel CAD with moderate in-stent restenosis in the LAD, negative pressure wire evaluation 2. Patent left main, LCx, and RCA without significant stenosis 3. Normal LV function, LVEF 65%  Recommend: medical therapy, tobacco cessation   )         Anesthesia Quick Evaluation
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
## 2024-05-08 NOTE — Progress Notes (Signed)
 Anesthesia Chart Review:  63 year old male follows with cardiologist Dr. Denyse Bathe for history of CAD (MI, s/p DES mid LAD 06/09/16; PTCA for moderate ISR 12/04/19),  mild aortic regurgitation, mild pulmonary hypertension. Cath 11/25/2022 showed patent LAD stent, LCX/RCA, and LVEF normal, medical treatment recommended. Following the cath, he had a nuclear stress test 03/13/23 which was interpreted by Dr. Bathe as showing, Ischemia in the RCA territory with borderline LV dysfunction, dilated LV. Advise CCTA.  He did not have any additional testing.  I previously reached out to Dr. Bathe about the stress test result in January 2025 as patient was pending right reverse shoulder arthroplasty.  Dr. Bathe advised patient was okay to proceed from cardiac standpoint; did not elaborate on stress results.  Since that time, Dr. Bathe ordered another nuclear stress test on 01/29/2024 which he interpreted as showing, There is mild small reversible defect in anterior apical wall, with mild LV systolic dysfunction.  Mild ischemia in LAD territory with LVEF 47% and sinus bradycardia 43/min.  Patient had follow-up with Dr. Bathe on 03/18/2024.  Stress test was not discussed.  Note states only, Lung nodule growing, no chest pain but has associated SOB.    Other pertinent history includes current smoker with associated COPD (maintained on Breztri ), CVA, GERD on PPI, hep C s/p treatment, EtOH abuse, polysubstance abuse, schizoaffective disorder, bipolar 1.  Recent admission 8/27 through 03/23/2024 for COPD with acute exacerbation.  He had some associated chest pain was noted to be bradycardic with heart rate in the 40s and cardiology was consulted.  Per consult note by Dr. Santo 03/22/2024, hsTroponins negative, CXR raised question of pulmonary edema but BNP was stone cold normal and echo shows normal LV function/normal RV function/normal diastolic function so suspect other process on CXR - suspect noncardiac, query related  to enlarging spiculated lung mass concerning for lung CA, COPD.   He was treated with steroids, antibiotics, inhalers, supplemental O2.  Wheezing improved and he was weaned to room air.  Outpatient follow-up was arranged for spiculated right lower lobe nodule.  Subsequent PET/CT showed the nodule to be hypermetabolic.  No evidence of regional or distant metastatic disease.  He was seen by Dr. Kerrin on 04/30/2024 and recommended to undergo robotic assisted right upper lobe wedge resection, possible right upper lobectomy.   Preop labs reviewed, mild hyponatremia sodium 132, otherwise unremarkable.  History reviewed with anesthesiologist Dr. Mallory, okay to proceed as planned barring acute status change.   EKG 05/07/2024: Sinus bradycardia.  Rate 51.   Pulmonary function testing 04/17/2024 FVC 3.58 (64%) FEV1 2.83 (67%) FEV1 3.08 (73%) postbronchodilator DLCO 26.03 (83%)   TTE 03/21/24: 1. Left ventricular ejection fraction, by estimation, is 60 to 65%. The  left ventricle has normal function. The left ventricle has no regional  wall motion abnormalities. Left ventricular diastolic parameters were  normal.   2. Right ventricular systolic function is normal. The right ventricular  size is normal.   3. The mitral valve is normal in structure. No evidence of mitral valve  regurgitation. No evidence of mitral stenosis.   4. The aortic valve is normal in structure. Aortic valve regurgitation is  mild. No aortic stenosis is present.   5. Aortic dilatation noted. There is dilatation of the ascending aorta,  measuring 40 mm. There is dilatation of the aortic root, measuring 40 mm.   6. The inferior vena cava is dilated in size with <50% respiratory  variability, suggesting right atrial pressure of 15 mmHg.  Nuclear stress test 01/29/24: There is mild small reversible defect in anterior apical wall, with mild LV systolic dysfunction.  Mild ischemia in LAD territory with LVEF 47% and sinus  bradycardia 43/min.  Nuclear stress 03/08/2023: IMPRESSION: Ischemia in the RCA territory with borderline LV dysfunction, dilated LV. Advise CCTA.    Cath 11/25/2022:   Prox LAD to Mid LAD lesion is 25% stenosed.   The left ventricular systolic function is normal.   The left ventricular ejection fraction is greater than 65% by visual estimate.   There is no mitral valve regurgitation.   Stent LAD no restenosis, LCX/RCA, and LVEF normal, medical treatment.  Nuclear stress 11/01/2022: EKG RESULTS: NSR. 53/min. No significant ST changes with persantine . IMAGE QUALITY: Good PERFUSION/WALL MOTION FINDINGS: EF = 59%. Small mild reversible basal inferoseptal and inferior, mid inferoseptal, apical inferior and apex (17) wall defects, normal wall motion.    IMPRESSION: Equivocal stress test with normal LVEF.    Cath 12/04/2019: Previously placed Mid LAD drug eluting stent is widely patent. Balloon angioplasty was performed. Prox LAD to Mid LAD lesion is 60% stenosed. The left ventricular systolic function is normal. LV end diastolic pressure is normal. The left ventricular ejection fraction is greater than 65% by visual estimate.   1. Single vessel CAD with moderate in-stent restenosis in the LAD, negative pressure wire evaluation 2. Patent left main, LCx, and RCA without significant stenosis 3. Normal LV function, LVEF 65%   Recommend: medical therapy, tobacco cessation      Lynwood Geofm RIGGERS [facility]/Anesthesiology Phone [phone] 05/08/2024 2:17 PM

## 2024-05-09 ENCOUNTER — Encounter (HOSPITAL_COMMUNITY)
Admission: RE | Disposition: E | Payer: Self-pay | Source: Home / Self Care | Attending: Thoracic Surgery (Cardiothoracic Vascular Surgery)

## 2024-05-09 ENCOUNTER — Inpatient Hospital Stay (HOSPITAL_COMMUNITY): Payer: MEDICAID

## 2024-05-09 ENCOUNTER — Inpatient Hospital Stay (HOSPITAL_COMMUNITY): Payer: MEDICAID | Admitting: Vascular Surgery

## 2024-05-09 ENCOUNTER — Inpatient Hospital Stay (HOSPITAL_COMMUNITY)
Admission: RE | Admit: 2024-05-09 | Discharge: 2024-06-24 | DRG: 003 | Disposition: E | Payer: MEDICAID | Attending: Cardiology | Admitting: Cardiology

## 2024-05-09 ENCOUNTER — Encounter (HOSPITAL_COMMUNITY): Payer: Self-pay | Admitting: Thoracic Surgery (Cardiothoracic Vascular Surgery)

## 2024-05-09 DIAGNOSIS — Z66 Do not resuscitate: Secondary | ICD-10-CM | POA: Diagnosis not present

## 2024-05-09 DIAGNOSIS — J984 Other disorders of lung: Secondary | ICD-10-CM | POA: Diagnosis present

## 2024-05-09 DIAGNOSIS — F430 Acute stress reaction: Secondary | ICD-10-CM | POA: Diagnosis present

## 2024-05-09 DIAGNOSIS — F05 Delirium due to known physiological condition: Secondary | ICD-10-CM | POA: Diagnosis not present

## 2024-05-09 DIAGNOSIS — G4089 Other seizures: Secondary | ICD-10-CM | POA: Diagnosis not present

## 2024-05-09 DIAGNOSIS — J440 Chronic obstructive pulmonary disease with acute lower respiratory infection: Secondary | ICD-10-CM | POA: Diagnosis present

## 2024-05-09 DIAGNOSIS — J95851 Ventilator associated pneumonia: Secondary | ICD-10-CM | POA: Diagnosis not present

## 2024-05-09 DIAGNOSIS — J80 Acute respiratory distress syndrome: Secondary | ICD-10-CM | POA: Diagnosis present

## 2024-05-09 DIAGNOSIS — J9601 Acute respiratory failure with hypoxia: Secondary | ICD-10-CM

## 2024-05-09 DIAGNOSIS — J449 Chronic obstructive pulmonary disease, unspecified: Secondary | ICD-10-CM | POA: Diagnosis not present

## 2024-05-09 DIAGNOSIS — F1410 Cocaine abuse, uncomplicated: Secondary | ICD-10-CM | POA: Diagnosis present

## 2024-05-09 DIAGNOSIS — D649 Anemia, unspecified: Secondary | ICD-10-CM | POA: Diagnosis not present

## 2024-05-09 DIAGNOSIS — B49 Unspecified mycosis: Secondary | ICD-10-CM | POA: Diagnosis not present

## 2024-05-09 DIAGNOSIS — Z8249 Family history of ischemic heart disease and other diseases of the circulatory system: Secondary | ICD-10-CM

## 2024-05-09 DIAGNOSIS — Z902 Acquired absence of lung [part of]: Secondary | ICD-10-CM | POA: Diagnosis not present

## 2024-05-09 DIAGNOSIS — Z96611 Presence of right artificial shoulder joint: Secondary | ICD-10-CM | POA: Diagnosis present

## 2024-05-09 DIAGNOSIS — F064 Anxiety disorder due to known physiological condition: Secondary | ICD-10-CM | POA: Diagnosis present

## 2024-05-09 DIAGNOSIS — G9389 Other specified disorders of brain: Secondary | ICD-10-CM | POA: Diagnosis not present

## 2024-05-09 DIAGNOSIS — T80219A Unspecified infection due to central venous catheter, initial encounter: Secondary | ICD-10-CM

## 2024-05-09 DIAGNOSIS — B371 Pulmonary candidiasis: Secondary | ICD-10-CM | POA: Diagnosis not present

## 2024-05-09 DIAGNOSIS — R7401 Elevation of levels of liver transaminase levels: Secondary | ICD-10-CM | POA: Diagnosis not present

## 2024-05-09 DIAGNOSIS — Z9281 Personal history of extracorporeal membrane oxygenation (ECMO): Secondary | ICD-10-CM | POA: Diagnosis not present

## 2024-05-09 DIAGNOSIS — Z9911 Dependence on respirator [ventilator] status: Secondary | ICD-10-CM

## 2024-05-09 DIAGNOSIS — F209 Schizophrenia, unspecified: Secondary | ICD-10-CM | POA: Diagnosis not present

## 2024-05-09 DIAGNOSIS — E870 Hyperosmolality and hypernatremia: Secondary | ICD-10-CM | POA: Diagnosis not present

## 2024-05-09 DIAGNOSIS — J939 Pneumothorax, unspecified: Secondary | ICD-10-CM | POA: Diagnosis not present

## 2024-05-09 DIAGNOSIS — J9611 Chronic respiratory failure with hypoxia: Secondary | ICD-10-CM | POA: Diagnosis not present

## 2024-05-09 DIAGNOSIS — J9382 Other air leak: Secondary | ICD-10-CM | POA: Diagnosis not present

## 2024-05-09 DIAGNOSIS — U071 COVID-19: Secondary | ICD-10-CM

## 2024-05-09 DIAGNOSIS — J9621 Acute and chronic respiratory failure with hypoxia: Secondary | ICD-10-CM | POA: Diagnosis not present

## 2024-05-09 DIAGNOSIS — Z560 Unemployment, unspecified: Secondary | ICD-10-CM

## 2024-05-09 DIAGNOSIS — R6521 Severe sepsis with septic shock: Secondary | ICD-10-CM | POA: Diagnosis not present

## 2024-05-09 DIAGNOSIS — I472 Ventricular tachycardia, unspecified: Secondary | ICD-10-CM | POA: Diagnosis not present

## 2024-05-09 DIAGNOSIS — R911 Solitary pulmonary nodule: Secondary | ICD-10-CM

## 2024-05-09 DIAGNOSIS — N179 Acute kidney failure, unspecified: Secondary | ICD-10-CM | POA: Diagnosis not present

## 2024-05-09 DIAGNOSIS — J810 Acute pulmonary edema: Secondary | ICD-10-CM | POA: Diagnosis not present

## 2024-05-09 DIAGNOSIS — E874 Mixed disorder of acid-base balance: Secondary | ICD-10-CM | POA: Diagnosis not present

## 2024-05-09 DIAGNOSIS — G9341 Metabolic encephalopathy: Secondary | ICD-10-CM | POA: Diagnosis present

## 2024-05-09 DIAGNOSIS — I10 Essential (primary) hypertension: Secondary | ICD-10-CM | POA: Diagnosis not present

## 2024-05-09 DIAGNOSIS — J90 Pleural effusion, not elsewhere classified: Secondary | ICD-10-CM | POA: Diagnosis not present

## 2024-05-09 DIAGNOSIS — Z9981 Dependence on supplemental oxygen: Secondary | ICD-10-CM

## 2024-05-09 DIAGNOSIS — Z8711 Personal history of peptic ulcer disease: Secondary | ICD-10-CM

## 2024-05-09 DIAGNOSIS — Z9889 Other specified postprocedural states: Secondary | ICD-10-CM | POA: Diagnosis not present

## 2024-05-09 DIAGNOSIS — Z515 Encounter for palliative care: Secondary | ICD-10-CM | POA: Diagnosis not present

## 2024-05-09 DIAGNOSIS — D62 Acute posthemorrhagic anemia: Secondary | ICD-10-CM | POA: Diagnosis not present

## 2024-05-09 DIAGNOSIS — Z59868 Other specified financial insecurity: Secondary | ICD-10-CM

## 2024-05-09 DIAGNOSIS — Z96651 Presence of right artificial knee joint: Secondary | ICD-10-CM | POA: Diagnosis present

## 2024-05-09 DIAGNOSIS — B377 Candidal sepsis: Secondary | ICD-10-CM | POA: Diagnosis not present

## 2024-05-09 DIAGNOSIS — R17 Unspecified jaundice: Secondary | ICD-10-CM | POA: Diagnosis not present

## 2024-05-09 DIAGNOSIS — J441 Chronic obstructive pulmonary disease with (acute) exacerbation: Secondary | ICD-10-CM | POA: Diagnosis present

## 2024-05-09 DIAGNOSIS — J9690 Respiratory failure, unspecified, unspecified whether with hypoxia or hypercapnia: Secondary | ICD-10-CM | POA: Diagnosis not present

## 2024-05-09 DIAGNOSIS — Z8673 Personal history of transient ischemic attack (TIA), and cerebral infarction without residual deficits: Secondary | ICD-10-CM

## 2024-05-09 DIAGNOSIS — E876 Hypokalemia: Secondary | ICD-10-CM | POA: Diagnosis not present

## 2024-05-09 DIAGNOSIS — A498 Other bacterial infections of unspecified site: Secondary | ICD-10-CM

## 2024-05-09 DIAGNOSIS — Z6832 Body mass index (BMI) 32.0-32.9, adult: Secondary | ICD-10-CM | POA: Diagnosis not present

## 2024-05-09 DIAGNOSIS — J439 Emphysema, unspecified: Secondary | ICD-10-CM | POA: Diagnosis present

## 2024-05-09 DIAGNOSIS — K219 Gastro-esophageal reflux disease without esophagitis: Secondary | ICD-10-CM | POA: Diagnosis present

## 2024-05-09 DIAGNOSIS — I110 Hypertensive heart disease with heart failure: Secondary | ICD-10-CM | POA: Diagnosis present

## 2024-05-09 DIAGNOSIS — Y848 Other medical procedures as the cause of abnormal reaction of the patient, or of later complication, without mention of misadventure at the time of the procedure: Secondary | ICD-10-CM | POA: Diagnosis not present

## 2024-05-09 DIAGNOSIS — Z888 Allergy status to other drugs, medicaments and biological substances status: Secondary | ICD-10-CM

## 2024-05-09 DIAGNOSIS — C3412 Malignant neoplasm of upper lobe, left bronchus or lung: Secondary | ICD-10-CM | POA: Diagnosis not present

## 2024-05-09 DIAGNOSIS — Z87891 Personal history of nicotine dependence: Secondary | ICD-10-CM

## 2024-05-09 DIAGNOSIS — I5032 Chronic diastolic (congestive) heart failure: Secondary | ICD-10-CM | POA: Diagnosis present

## 2024-05-09 DIAGNOSIS — E43 Unspecified severe protein-calorie malnutrition: Secondary | ICD-10-CM | POA: Insufficient documentation

## 2024-05-09 DIAGNOSIS — I82461 Acute embolism and thrombosis of right calf muscular vein: Secondary | ICD-10-CM | POA: Diagnosis not present

## 2024-05-09 DIAGNOSIS — J168 Pneumonia due to other specified infectious organisms: Secondary | ICD-10-CM | POA: Diagnosis present

## 2024-05-09 DIAGNOSIS — J151 Pneumonia due to Pseudomonas: Secondary | ICD-10-CM | POA: Diagnosis not present

## 2024-05-09 DIAGNOSIS — J9622 Acute and chronic respiratory failure with hypercapnia: Secondary | ICD-10-CM | POA: Diagnosis not present

## 2024-05-09 DIAGNOSIS — Z820 Family history of epilepsy and other diseases of the nervous system: Secondary | ICD-10-CM

## 2024-05-09 DIAGNOSIS — I2510 Atherosclerotic heart disease of native coronary artery without angina pectoris: Secondary | ICD-10-CM

## 2024-05-09 DIAGNOSIS — E440 Moderate protein-calorie malnutrition: Secondary | ICD-10-CM | POA: Diagnosis present

## 2024-05-09 DIAGNOSIS — Z885 Allergy status to narcotic agent status: Secondary | ICD-10-CM

## 2024-05-09 DIAGNOSIS — J948 Other specified pleural conditions: Secondary | ICD-10-CM | POA: Diagnosis not present

## 2024-05-09 DIAGNOSIS — I351 Nonrheumatic aortic (valve) insufficiency: Secondary | ICD-10-CM | POA: Diagnosis not present

## 2024-05-09 DIAGNOSIS — Z7901 Long term (current) use of anticoagulants: Secondary | ICD-10-CM | POA: Diagnosis not present

## 2024-05-09 DIAGNOSIS — A419 Sepsis, unspecified organism: Secondary | ICD-10-CM | POA: Diagnosis not present

## 2024-05-09 DIAGNOSIS — R739 Hyperglycemia, unspecified: Secondary | ICD-10-CM | POA: Diagnosis not present

## 2024-05-09 DIAGNOSIS — F4329 Adjustment disorder with other symptoms: Secondary | ICD-10-CM | POA: Diagnosis not present

## 2024-05-09 DIAGNOSIS — J9602 Acute respiratory failure with hypercapnia: Secondary | ICD-10-CM | POA: Diagnosis not present

## 2024-05-09 DIAGNOSIS — Z955 Presence of coronary angioplasty implant and graft: Secondary | ICD-10-CM

## 2024-05-09 DIAGNOSIS — C3411 Malignant neoplasm of upper lobe, right bronchus or lung: Secondary | ICD-10-CM | POA: Diagnosis present

## 2024-05-09 DIAGNOSIS — Z9152 Personal history of nonsuicidal self-harm: Secondary | ICD-10-CM

## 2024-05-09 DIAGNOSIS — E871 Hypo-osmolality and hyponatremia: Secondary | ICD-10-CM | POA: Diagnosis present

## 2024-05-09 DIAGNOSIS — I509 Heart failure, unspecified: Secondary | ICD-10-CM | POA: Diagnosis not present

## 2024-05-09 DIAGNOSIS — I252 Old myocardial infarction: Secondary | ICD-10-CM

## 2024-05-09 DIAGNOSIS — Z9151 Personal history of suicidal behavior: Secondary | ICD-10-CM

## 2024-05-09 DIAGNOSIS — F4310 Post-traumatic stress disorder, unspecified: Secondary | ICD-10-CM | POA: Diagnosis present

## 2024-05-09 DIAGNOSIS — F250 Schizoaffective disorder, bipolar type: Secondary | ICD-10-CM | POA: Diagnosis present

## 2024-05-09 DIAGNOSIS — R609 Edema, unspecified: Secondary | ICD-10-CM | POA: Diagnosis not present

## 2024-05-09 DIAGNOSIS — B965 Pseudomonas (aeruginosa) (mallei) (pseudomallei) as the cause of diseases classified elsewhere: Secondary | ICD-10-CM | POA: Diagnosis not present

## 2024-05-09 DIAGNOSIS — Z79899 Other long term (current) drug therapy: Secondary | ICD-10-CM

## 2024-05-09 DIAGNOSIS — F419 Anxiety disorder, unspecified: Secondary | ICD-10-CM | POA: Diagnosis not present

## 2024-05-09 DIAGNOSIS — Z723 Lack of physical exercise: Secondary | ICD-10-CM

## 2024-05-09 DIAGNOSIS — E781 Pure hyperglyceridemia: Secondary | ICD-10-CM | POA: Diagnosis present

## 2024-05-09 DIAGNOSIS — F4322 Adjustment disorder with anxiety: Secondary | ICD-10-CM | POA: Diagnosis present

## 2024-05-09 DIAGNOSIS — Z818 Family history of other mental and behavioral disorders: Secondary | ICD-10-CM

## 2024-05-09 DIAGNOSIS — G8929 Other chronic pain: Secondary | ICD-10-CM | POA: Diagnosis present

## 2024-05-09 DIAGNOSIS — J189 Pneumonia, unspecified organism: Secondary | ICD-10-CM | POA: Diagnosis not present

## 2024-05-09 DIAGNOSIS — Z7982 Long term (current) use of aspirin: Secondary | ICD-10-CM

## 2024-05-09 DIAGNOSIS — Z7189 Other specified counseling: Secondary | ICD-10-CM | POA: Diagnosis not present

## 2024-05-09 DIAGNOSIS — F411 Generalized anxiety disorder: Secondary | ICD-10-CM | POA: Diagnosis not present

## 2024-05-09 DIAGNOSIS — L8990 Pressure ulcer of unspecified site, unspecified stage: Secondary | ICD-10-CM | POA: Insufficient documentation

## 2024-05-09 HISTORY — PX: LYMPH NODE BIOPSY: SHX201

## 2024-05-09 HISTORY — PX: WEDGE RESECTION, LUNG, ROBOT-ASSISTED, THORACOSCOPIC: SHX7655

## 2024-05-09 HISTORY — PX: INTERCOSTAL NERVE BLOCK: SHX5021

## 2024-05-09 HISTORY — PX: LOBECTOMY, LUNG, ROBOT-ASSISTED, USING VATS: SHX7607

## 2024-05-09 LAB — BPAM RBC
Blood Product Expiration Date: 202511072359
Blood Product Expiration Date: 202511072359
ISSUE DATE / TIME: 202510161444
ISSUE DATE / TIME: 202510161444
Unit Type and Rh: 5100
Unit Type and Rh: 5100

## 2024-05-09 LAB — PREPARE RBC (CROSSMATCH)

## 2024-05-09 LAB — TYPE AND SCREEN
ABO/RH(D): O POS
Antibody Screen: NEGATIVE
Unit division: 0
Unit division: 0

## 2024-05-09 SURGERY — WEDGE RESECTION, LUNG, ROBOT-ASSISTED, THORACOSCOPIC
Anesthesia: General | Site: Chest | Laterality: Right

## 2024-05-09 MED ORDER — HYDROXYZINE HCL 25 MG PO TABS
25.0000 mg | ORAL_TABLET | Freq: Every day | ORAL | Status: DC
  Administered 2024-05-10 – 2024-05-14 (×5): 25 mg via ORAL
  Filled 2024-05-09 (×5): qty 1

## 2024-05-09 MED ORDER — ONDANSETRON HCL 4 MG/2ML IJ SOLN
INTRAMUSCULAR | Status: DC | PRN
Start: 2024-05-09 — End: 2024-05-09
  Administered 2024-05-09: 4 mg via INTRAVENOUS

## 2024-05-09 MED ORDER — GLYCOPYRROLATE 0.2 MG/ML IJ SOLN
INTRAMUSCULAR | Status: DC | PRN
  Administered 2024-05-09: .2 mg via INTRAVENOUS

## 2024-05-09 MED ORDER — SENNOSIDES-DOCUSATE SODIUM 8.6-50 MG PO TABS
1.0000 | ORAL_TABLET | Freq: Every day | ORAL | Status: DC
Start: 2024-05-09 — End: 2024-05-15
  Administered 2024-05-09 – 2024-05-14 (×6): 1 via ORAL
  Filled 2024-05-09 (×6): qty 1

## 2024-05-09 MED ORDER — BUPIVACAINE LIPOSOME 1.3 % IJ SUSP
INTRAMUSCULAR | Status: AC
  Filled 2024-05-09: qty 20

## 2024-05-09 MED ORDER — DIVALPROEX SODIUM 500 MG PO DR TAB
500.0000 mg | DELAYED_RELEASE_TABLET | Freq: Every day | ORAL | Status: DC
Start: 2024-05-10 — End: 2024-05-19
  Administered 2024-05-10 – 2024-05-18 (×9): 500 mg via ORAL
  Filled 2024-05-09 (×10): qty 1

## 2024-05-09 MED ORDER — BISACODYL 5 MG PO TBEC
10.0000 mg | DELAYED_RELEASE_TABLET | Freq: Every day | ORAL | Status: DC
Start: 2024-05-09 — End: 2024-05-15
  Administered 2024-05-09 – 2024-05-14 (×6): 10 mg via ORAL
  Filled 2024-05-09 (×6): qty 2

## 2024-05-09 MED ORDER — CELECOXIB 200 MG PO CAPS
200.0000 mg | ORAL_CAPSULE | Freq: Two times a day (BID) | ORAL | Status: DC
  Administered 2024-05-09 – 2024-05-18 (×19): 200 mg via ORAL
  Filled 2024-05-09 (×21): qty 1

## 2024-05-09 MED ORDER — ONDANSETRON HCL 4 MG/2ML IJ SOLN: 4.0000 mg | Freq: Four times a day (QID) | INTRAMUSCULAR | Status: DC | PRN

## 2024-05-09 MED ORDER — SODIUM CHLORIDE (PF) 0.9 % IJ SOLN
INTRAMUSCULAR | Status: AC
Start: 2024-05-09 — End: 2024-05-09
  Filled 2024-05-09: qty 50

## 2024-05-09 MED ORDER — PROPOFOL 10 MG/ML IV BOLUS
INTRAVENOUS | Status: DC | PRN
  Administered 2024-05-09: 200 mg via INTRAVENOUS

## 2024-05-09 MED ORDER — PANTOPRAZOLE SODIUM 40 MG PO TBEC
40.0000 mg | DELAYED_RELEASE_TABLET | Freq: Every day | ORAL | Status: DC
  Administered 2024-05-10 – 2024-05-19 (×10): 40 mg via ORAL
  Filled 2024-05-09 (×10): qty 1

## 2024-05-09 MED ORDER — FENTANYL CITRATE (PF) 100 MCG/2ML IJ SOLN
INTRAMUSCULAR | Status: AC
  Filled 2024-05-09: qty 2

## 2024-05-09 MED ORDER — BUPIVACAINE LIPOSOME 1.3 % IJ SUSP
INTRAMUSCULAR | Status: DC | PRN
  Administered 2024-05-09: 100 mL

## 2024-05-09 MED ORDER — DIVALPROEX SODIUM 500 MG PO DR TAB
1000.0000 mg | DELAYED_RELEASE_TABLET | Freq: Every day | ORAL | Status: DC
  Administered 2024-05-09 – 2024-05-18 (×10): 1000 mg via ORAL
  Filled 2024-05-09 (×10): qty 2

## 2024-05-09 MED ORDER — ONDANSETRON HCL 4 MG/2ML IJ SOLN
INTRAMUSCULAR | Status: AC
  Filled 2024-05-09: qty 6

## 2024-05-09 MED ORDER — HYDROMORPHONE HCL 1 MG/ML IJ SOLN
0.2500 mg | INTRAMUSCULAR | Status: DC | PRN
  Administered 2024-05-09: 0.25 mg via INTRAVENOUS
  Administered 2024-05-09: 0.5 mg via INTRAVENOUS
  Administered 2024-05-09: 0.25 mg via INTRAVENOUS

## 2024-05-09 MED ORDER — PANTOPRAZOLE SODIUM 40 MG PO TBEC: 40.0000 mg | DELAYED_RELEASE_TABLET | Freq: Every day | ORAL | Status: DC

## 2024-05-09 MED ORDER — MIDAZOLAM HCL (PF) 2 MG/2ML IJ SOLN
INTRAMUSCULAR | Status: DC | PRN
  Administered 2024-05-09: 2 mg via INTRAVENOUS

## 2024-05-09 MED ORDER — HYDROMORPHONE HCL 1 MG/ML IJ SOLN
INTRAMUSCULAR | Status: AC
  Filled 2024-05-09: qty 0.5

## 2024-05-09 MED ORDER — PHENYLEPHRINE HCL-NACL 20-0.9 MG/250ML-% IV SOLN
INTRAVENOUS | Status: DC | PRN
  Administered 2024-05-09: 40 ug/min via INTRAVENOUS

## 2024-05-09 MED ORDER — ALBUTEROL SULFATE (2.5 MG/3ML) 0.083% IN NEBU
2.5000 mg | INHALATION_SOLUTION | Freq: Four times a day (QID) | RESPIRATORY_TRACT | Status: DC | PRN
  Administered 2024-05-10 – 2024-05-13 (×3): 2.5 mg via RESPIRATORY_TRACT
  Filled 2024-05-09 (×5): qty 3

## 2024-05-09 MED ORDER — BUDESON-GLYCOPYRROL-FORMOTEROL 160-9-4.8 MCG/ACT IN AERO
2.0000 | INHALATION_SPRAY | Freq: Two times a day (BID) | RESPIRATORY_TRACT | Status: DC
Start: 2024-05-09 — End: 2024-05-15
  Administered 2024-05-09 – 2024-05-13 (×9): 2 via RESPIRATORY_TRACT
  Filled 2024-05-09 (×2): qty 5.9

## 2024-05-09 MED ORDER — DIVALPROEX SODIUM 250 MG PO DR TAB
250.0000 mg | DELAYED_RELEASE_TABLET | Freq: Every morning | ORAL | Status: DC
  Administered 2024-05-10 – 2024-05-18 (×9): 250 mg via ORAL
  Filled 2024-05-09 (×10): qty 1

## 2024-05-09 MED ORDER — ROCURONIUM BROMIDE 10 MG/ML (PF) SYRINGE
PREFILLED_SYRINGE | INTRAVENOUS | Status: AC
  Filled 2024-05-09: qty 40

## 2024-05-09 MED ORDER — GABAPENTIN 300 MG PO CAPS
1200.0000 mg | ORAL_CAPSULE | Freq: Every day | ORAL | Status: DC
Start: 2024-05-09 — End: 2024-05-19
  Administered 2024-05-09 – 2024-05-18 (×10): 1200 mg via ORAL
  Filled 2024-05-09 (×10): qty 4

## 2024-05-09 MED ORDER — KETAMINE HCL 10 MG/ML IJ SOLN
INTRAMUSCULAR | Status: DC | PRN
  Administered 2024-05-09: 50 mg via INTRAVENOUS

## 2024-05-09 MED ORDER — PHENYLEPHRINE 80 MCG/ML (10ML) SYRINGE FOR IV PUSH (FOR BLOOD PRESSURE SUPPORT)
PREFILLED_SYRINGE | INTRAVENOUS | Status: AC
  Filled 2024-05-09: qty 20

## 2024-05-09 MED ORDER — CEFAZOLIN SODIUM-DEXTROSE 2-4 GM/100ML-% IV SOLN
2.0000 g | Freq: Three times a day (TID) | INTRAVENOUS | Status: AC
  Administered 2024-05-09 – 2024-05-10 (×2): 2 g via INTRAVENOUS
  Filled 2024-05-09 (×2): qty 100

## 2024-05-09 MED ORDER — MIDAZOLAM HCL 2 MG/2ML IJ SOLN
INTRAMUSCULAR | Status: AC
Start: 2024-05-09 — End: 2024-05-09
  Filled 2024-05-09: qty 2

## 2024-05-09 MED ORDER — ATORVASTATIN CALCIUM 80 MG PO TABS
80.0000 mg | ORAL_TABLET | Freq: Every day | ORAL | Status: DC
  Administered 2024-05-09 – 2024-05-18 (×10): 80 mg via ORAL
  Filled 2024-05-09 (×10): qty 1

## 2024-05-09 MED ORDER — FENTANYL CITRATE (PF) 250 MCG/5ML IJ SOLN
INTRAMUSCULAR | Status: AC
  Filled 2024-05-09: qty 5

## 2024-05-09 MED ORDER — ORAL CARE MOUTH RINSE: 15.0000 mL | Freq: Once | OROMUCOSAL | Status: AC

## 2024-05-09 MED ORDER — MORPHINE SULFATE (PF) 2 MG/ML IV SOLN
2.0000 mg | INTRAVENOUS | Status: DC | PRN
  Administered 2024-05-09 – 2024-05-10 (×2): 2 mg via INTRAVENOUS
  Filled 2024-05-09 (×2): qty 1

## 2024-05-09 MED ORDER — SUGAMMADEX SODIUM 200 MG/2ML IV SOLN
INTRAVENOUS | Status: DC | PRN
  Administered 2024-05-09 (×2): 100 mg via INTRAVENOUS

## 2024-05-09 MED ORDER — MIDAZOLAM HCL 2 MG/2ML IJ SOLN
INTRAMUSCULAR | Status: AC
  Filled 2024-05-09: qty 2

## 2024-05-09 MED ORDER — SODIUM CHLORIDE 0.9 % IV SOLN: INTRAVENOUS | Status: DC

## 2024-05-09 MED ORDER — ROCURONIUM BROMIDE 10 MG/ML (PF) SYRINGE
PREFILLED_SYRINGE | INTRAVENOUS | Status: DC | PRN
  Administered 2024-05-09: 10 mg via INTRAVENOUS
  Administered 2024-05-09: 40 mg via INTRAVENOUS
  Administered 2024-05-09: 10 mg via INTRAVENOUS
  Administered 2024-05-09: 60 mg via INTRAVENOUS
  Administered 2024-05-09: 10 mg via INTRAVENOUS

## 2024-05-09 MED ORDER — ASPIRIN 81 MG PO TBEC
81.0000 mg | DELAYED_RELEASE_TABLET | Freq: Every day | ORAL | Status: DC
  Administered 2024-05-10 – 2024-05-18 (×9): 81 mg via ORAL
  Filled 2024-05-09 (×9): qty 1

## 2024-05-09 MED ORDER — SODIUM CHLORIDE 0.9 % IR SOLN
Status: DC | PRN
  Administered 2024-05-09: 1000 mL

## 2024-05-09 MED ORDER — ENOXAPARIN SODIUM 40 MG/0.4ML IJ SOSY
40.0000 mg | PREFILLED_SYRINGE | Freq: Every day | INTRAMUSCULAR | Status: DC
  Administered 2024-05-10 – 2024-05-19 (×10): 40 mg via SUBCUTANEOUS
  Filled 2024-05-09 (×10): qty 0.4

## 2024-05-09 MED ORDER — QUETIAPINE FUMARATE 100 MG PO TABS
200.0000 mg | ORAL_TABLET | Freq: Every day | ORAL | Status: DC
Start: 2024-05-09 — End: 2024-05-15
  Administered 2024-05-09 – 2024-05-14 (×6): 200 mg via ORAL
  Filled 2024-05-09 (×6): qty 2

## 2024-05-09 MED ORDER — ACETAMINOPHEN 500 MG PO TABS
1000.0000 mg | ORAL_TABLET | Freq: Once | ORAL | Status: AC
  Administered 2024-05-09: 1000 mg via ORAL
  Filled 2024-05-09: qty 2

## 2024-05-09 MED ORDER — CHLORHEXIDINE GLUCONATE 0.12 % MT SOLN
15.0000 mL | Freq: Once | OROMUCOSAL | Status: AC
  Administered 2024-05-09: 15 mL via OROMUCOSAL
  Filled 2024-05-09: qty 15

## 2024-05-09 MED ORDER — ACETAMINOPHEN 500 MG PO TABS
1000.0000 mg | ORAL_TABLET | Freq: Four times a day (QID) | ORAL | Status: AC
  Administered 2024-05-09 – 2024-05-14 (×15): 1000 mg via ORAL
  Filled 2024-05-09 (×17): qty 2

## 2024-05-09 MED ORDER — DEXAMETHASONE SOD PHOSPHATE PF 10 MG/ML IJ SOLN
INTRAMUSCULAR | Status: DC | PRN
  Administered 2024-05-09: 10 mg via INTRAVENOUS

## 2024-05-09 MED ORDER — HYDROMORPHONE HCL 1 MG/ML IJ SOLN
INTRAMUSCULAR | Status: AC
  Filled 2024-05-09: qty 1

## 2024-05-09 MED ORDER — ACETAMINOPHEN 160 MG/5ML PO SOLN
1000.0000 mg | Freq: Four times a day (QID) | ORAL | Status: AC
  Administered 2024-05-13 (×2): 1000 mg via ORAL
  Filled 2024-05-09 (×3): qty 40.6

## 2024-05-09 MED ORDER — FENTANYL CITRATE (PF) 100 MCG/2ML IJ SOLN
INTRAMUSCULAR | Status: DC | PRN
  Administered 2024-05-09: 50 ug via INTRAVENOUS

## 2024-05-09 MED ORDER — HYDROMORPHONE HCL 1 MG/ML IJ SOLN
INTRAMUSCULAR | Status: DC | PRN
Start: 2024-05-09 — End: 2024-05-09
  Administered 2024-05-09 (×2): .5 mg via INTRAVENOUS

## 2024-05-09 MED ORDER — 0.9 % SODIUM CHLORIDE (POUR BTL) OPTIME
TOPICAL | Status: DC | PRN
  Administered 2024-05-09: 1000 mL

## 2024-05-09 MED ORDER — CEFAZOLIN SODIUM-DEXTROSE 2-4 GM/100ML-% IV SOLN
2.0000 g | INTRAVENOUS | Status: AC
  Administered 2024-05-09: 2 g via INTRAVENOUS
  Filled 2024-05-09: qty 100

## 2024-05-09 MED ORDER — EPHEDRINE 5 MG/ML INJ
INTRAVENOUS | Status: AC
Start: 2024-05-09 — End: 2024-05-09
  Filled 2024-05-09: qty 10

## 2024-05-09 MED ORDER — PROPOFOL 10 MG/ML IV BOLUS
INTRAVENOUS | Status: AC
  Filled 2024-05-09: qty 20

## 2024-05-09 MED ORDER — KETAMINE HCL 50 MG/5ML IJ SOSY
PREFILLED_SYRINGE | INTRAMUSCULAR | Status: AC
  Filled 2024-05-09: qty 5

## 2024-05-09 MED ORDER — DULOXETINE HCL 30 MG PO CPEP
30.0000 mg | ORAL_CAPSULE | Freq: Three times a day (TID) | ORAL | Status: DC
  Administered 2024-05-09 – 2024-05-19 (×30): 30 mg via ORAL
  Filled 2024-05-09 (×32): qty 1

## 2024-05-09 MED ORDER — LACTATED RINGERS IV SOLN: INTRAVENOUS | Status: DC | PRN

## 2024-05-09 MED ORDER — ALBUTEROL SULFATE HFA 108 (90 BASE) MCG/ACT IN AERS: 2.0000 | INHALATION_SPRAY | Freq: Four times a day (QID) | RESPIRATORY_TRACT | Status: DC | PRN

## 2024-05-09 MED ORDER — GABAPENTIN 300 MG PO CAPS
600.0000 mg | ORAL_CAPSULE | Freq: Every day | ORAL | Status: DC
Start: 2024-05-10 — End: 2024-05-19
  Administered 2024-05-10 – 2024-05-18 (×9): 600 mg via ORAL
  Filled 2024-05-09 (×9): qty 2

## 2024-05-09 MED ORDER — LIDOCAINE 2% (20 MG/ML) 5 ML SYRINGE
INTRAMUSCULAR | Status: AC
  Filled 2024-05-09: qty 10

## 2024-05-09 MED ORDER — HEMOSTATIC AGENTS (NO CHARGE) OPTIME
TOPICAL | Status: DC | PRN
  Administered 2024-05-09 (×3): 1 via TOPICAL

## 2024-05-09 MED ORDER — ALBUMIN HUMAN 5 % IV SOLN
INTRAVENOUS | Status: DC | PRN
Start: 2024-05-09 — End: 2024-05-09

## 2024-05-09 MED ORDER — OXYCODONE HCL 5 MG PO TABS
5.0000 mg | ORAL_TABLET | ORAL | Status: DC | PRN
  Administered 2024-05-09 – 2024-05-10 (×2): 10 mg via ORAL
  Filled 2024-05-09 (×2): qty 2

## 2024-05-09 MED ORDER — TRAMADOL HCL 50 MG PO TABS
50.0000 mg | ORAL_TABLET | Freq: Four times a day (QID) | ORAL | Status: DC | PRN
  Administered 2024-05-11: 50 mg via ORAL
  Administered 2024-05-15 – 2024-05-17 (×6): 100 mg via ORAL
  Filled 2024-05-09 (×5): qty 2
  Filled 2024-05-09: qty 1
  Filled 2024-05-09: qty 2

## 2024-05-09 MED ORDER — PHENYLEPHRINE 80 MCG/ML (10ML) SYRINGE FOR IV PUSH (FOR BLOOD PRESSURE SUPPORT)
PREFILLED_SYRINGE | INTRAVENOUS | Status: DC | PRN
  Administered 2024-05-09: 80 ug via INTRAVENOUS

## 2024-05-09 MED ORDER — BUPIVACAINE HCL (PF) 0.5 % IJ SOLN
INTRAMUSCULAR | Status: AC
  Filled 2024-05-09: qty 30

## 2024-05-09 MED ORDER — ISOSORBIDE MONONITRATE ER 30 MG PO TB24
30.0000 mg | ORAL_TABLET | Freq: Every day | ORAL | Status: DC
Start: 2024-05-10 — End: 2024-05-19
  Administered 2024-05-10 – 2024-05-18 (×9): 30 mg via ORAL
  Filled 2024-05-09 (×9): qty 1

## 2024-05-09 MED ORDER — EPHEDRINE SULFATE-NACL 50-0.9 MG/10ML-% IV SOSY
PREFILLED_SYRINGE | INTRAVENOUS | Status: DC | PRN
  Administered 2024-05-09: 10 mg via INTRAVENOUS

## 2024-05-09 MED ORDER — HYDRALAZINE HCL 20 MG/ML IJ SOLN
10.0000 mg | Freq: Four times a day (QID) | INTRAMUSCULAR | Status: DC | PRN
  Administered 2024-05-24 – 2024-06-02 (×3): 10 mg via INTRAVENOUS
  Filled 2024-05-09 (×3): qty 1

## 2024-05-09 MED ORDER — FENTANYL CITRATE (PF) 250 MCG/5ML IJ SOLN
INTRAMUSCULAR | Status: DC | PRN
  Administered 2024-05-09 (×2): 100 ug via INTRAVENOUS
  Administered 2024-05-09: 50 ug via INTRAVENOUS

## 2024-05-09 SURGICAL SUPPLY — 70 items
CANISTER SUCTION 3000ML PPV (SUCTIONS) ×6 IMPLANT
CANNULA REDUCER 12-8 DVNC XI (CANNULA) ×6 IMPLANT
CATH THORACIC 28FR (CATHETERS) IMPLANT
CNTNR URN SCR LID CUP LEK RST (MISCELLANEOUS) ×15 IMPLANT
CONN ST 1/4X3/8 BEN (MISCELLANEOUS) IMPLANT
DEFOGGER SCOPE WARM SEASHARP (MISCELLANEOUS) ×3 IMPLANT
DERMABOND ADVANCED .7 DNX12 (GAUZE/BANDAGES/DRESSINGS) ×3 IMPLANT
DRAIN CHANNEL 28F RND 3/8 FF (WOUND CARE) IMPLANT
DRAPE ARM DVNC X/XI (DISPOSABLE) ×12 IMPLANT
DRAPE COLUMN DVNC XI (DISPOSABLE) ×3 IMPLANT
DRAPE CV SPLIT W-CLR ANES SCRN (DRAPES) ×3 IMPLANT
DRAPE HALF SHEET 40X57 (DRAPES) ×3 IMPLANT
DRAPE INCISE IOBAN 66X45 STRL (DRAPES) IMPLANT
DRAPE SURG ORHT 6 SPLT 77X108 (DRAPES) ×3 IMPLANT
ELECT BLADE 6.5 EXT (BLADE) ×3 IMPLANT
ELECTRODE REM PT RTRN 9FT ADLT (ELECTROSURGICAL) ×3 IMPLANT
FORCEPS BPLR FENES DVNC XI (FORCEP) IMPLANT
FORCEPS BPLR LNG DVNC XI (INSTRUMENTS) IMPLANT
GAUZE KITTNER 4X5 RF (MISCELLANEOUS) ×6 IMPLANT
GAUZE SPONGE 4X4 12PLY STRL (GAUZE/BANDAGES/DRESSINGS) ×3 IMPLANT
GLOVE SS BIOGEL STRL SZ 7.5 (GLOVE) ×9 IMPLANT
GLOVE SURG POLYISO LF SZ8 (GLOVE) ×3 IMPLANT
GOWN STRL REUS W/ TWL LRG LVL3 (GOWN DISPOSABLE) ×6 IMPLANT
GOWN STRL REUS W/ TWL XL LVL3 (GOWN DISPOSABLE) ×6 IMPLANT
GOWN STRL REUS W/TWL 2XL LVL3 (GOWN DISPOSABLE) ×3 IMPLANT
GRASPER TIP-UP FEN DVNC XI (INSTRUMENTS) IMPLANT
HEMOSTAT SURGICEL 2X14 (HEMOSTASIS) ×9 IMPLANT
IRRIGATION STRYKERFLOW (MISCELLANEOUS) ×3 IMPLANT
KIT BASIN OR (CUSTOM PROCEDURE TRAY) ×3 IMPLANT
KIT SUCTION CATH 14FR (SUCTIONS) IMPLANT
KIT TURNOVER KIT B (KITS) ×3 IMPLANT
NDL HYPO 25GX1X1/2 BEV (NEEDLE) ×3 IMPLANT
NDL SPNL 22GX3.5 QUINCKE BK (NEEDLE) ×3 IMPLANT
NEEDLE HYPO 25GX1X1/2 BEV (NEEDLE) ×2 IMPLANT
NEEDLE SPNL 22GX3.5 QUINCKE BK (NEEDLE) ×2 IMPLANT
PACK CHEST (CUSTOM PROCEDURE TRAY) ×3 IMPLANT
PAD ARMBOARD POSITIONER FOAM (MISCELLANEOUS) ×6 IMPLANT
PORT ACCESS TROCAR AIRSEAL 12 (TROCAR) ×3 IMPLANT
RELOAD STAPLE 45 2.5 WHT DVNC (STAPLE) IMPLANT
RELOAD STAPLE 45 3.5 BLU DVNC (STAPLE) IMPLANT
RELOAD STAPLE 45 4.3 GRN DVNC (STAPLE) IMPLANT
RELOAD STAPLE 45 4.6 BLK DVNC (STAPLE) IMPLANT
SEAL UNIV 5-12 XI (MISCELLANEOUS) ×12 IMPLANT
SEALANT PROGEL (MISCELLANEOUS) IMPLANT
SET TRI-LUMEN FLTR TB AIRSEAL (TUBING) ×3 IMPLANT
SOLN 0.9% NACL 1000 ML (IV SOLUTION) ×4 IMPLANT
SOLN 0.9% NACL POUR BTL 1000ML (IV SOLUTION) ×6 IMPLANT
SOLN STERILE WATER 1000 ML (IV SOLUTION) ×4 IMPLANT
SOLN STERILE WATER BTL 1000 ML (IV SOLUTION) ×6 IMPLANT
SOLUTION ELECTROSURG ANTI STCK (MISCELLANEOUS) ×3 IMPLANT
SPONGE TONSIL 1 RF SGL (DISPOSABLE) IMPLANT
STAPLER 45 SUREFORM CVD DVNC (STAPLE) IMPLANT
SUT PROLENE 4-0 RB1 .5 CRCL 36 (SUTURE) IMPLANT
SUT SILK 1 MH (SUTURE) ×6 IMPLANT
SUT SILK 2 0 SH (SUTURE) ×3 IMPLANT
SUT VIC AB 1 CTX36XBRD ANBCTR (SUTURE) ×3 IMPLANT
SUT VIC AB 2-0 CTX 36 (SUTURE) ×3 IMPLANT
SUT VIC AB 3-0 X1 27 (SUTURE) ×6 IMPLANT
SUT VICRYL 0 TIES 12 18 (SUTURE) ×3 IMPLANT
SUT VICRYL 0 UR6 27IN ABS (SUTURE) ×6 IMPLANT
SYR 20CC LL (SYRINGE) ×6 IMPLANT
SYR 20ML LL LF (SYRINGE) ×6 IMPLANT
SYSTEM RETRIEVAL ANCHOR 12 (MISCELLANEOUS) IMPLANT
SYSTEM RETRIEVAL ANCHOR 15 (MISCELLANEOUS) IMPLANT
SYSTEM SAHARA CHEST DRAIN ATS (WOUND CARE) ×3 IMPLANT
TAPE CLOTH 4X10 WHT NS (GAUZE/BANDAGES/DRESSINGS) ×3 IMPLANT
TAPE CLOTH SURG 4X10 WHT LF (GAUZE/BANDAGES/DRESSINGS) IMPLANT
TIP SPRAY PROGEL 11IN (MISCELLANEOUS) IMPLANT
TOWEL GREEN STERILE (TOWEL DISPOSABLE) ×6 IMPLANT
TRAY FOLEY MTR SLVR 16FR STAT (SET/KITS/TRAYS/PACK) ×3 IMPLANT

## 2024-05-09 NOTE — Anesthesia Procedure Notes (Signed)
 Central Venous Catheter Insertion Performed by: Epifanio Charleston, MD, anesthesiologist Start/End10/16/2025 11:35 AM, 05/09/2024 11:45 AM Patient location: Pre-op. Preanesthetic checklist: patient identified, IV checked, site marked, risks and benefits discussed, surgical consent, monitors and equipment checked, pre-op evaluation, timeout performed and anesthesia consent Lidocaine  1% used for infiltration and patient sedated Hand hygiene performed  and maximum sterile barriers used  Catheter size: 8 Fr Total catheter length 16. Central line was placed.Double lumen Procedure performed using ultrasound to evaluate access site. Ultrasound Notes:relevant anatomy identified, ultrasound used to visualize needle entry, vessel patent under ultrasound and image(s) printed for medical record. Attempts: 1 Following insertion, dressing applied, line sutured and Biopatch. Post procedure assessment: blood return through all ports  Patient tolerated the procedure well with no immediate complications.

## 2024-05-09 NOTE — Hospital Course (Addendum)
 History of Present Illness:  Keith Gibbs is a 63 year old man with past medical history significant for bipolar 1 disorder, schizophrenia, polysubstance abuse, CAD, mild to moderate aortic insufficiency, hypertension, hyperlipidemia, MI, hepatitis C, numerous orthopedic and spine surgeries, chronic pain, tobacco abuse, and COPD.  He smoked about a half of cigarettes for 45 years prior to quitting on 04/17/2024.   He was admitted to the hospital in August with a COPD exacerbation.  Had presented with chest heaviness but cardiac causes were ruled out.  Treated with antibiotics and steroids.  He had a CT which showed an enlarging right upper lobe nodule.  He saw Dr. Isadora.  A PET/CT showed the nodule is hypermetabolic.  There was no evidence of regional or distant metastatic disease.  There was a left level 2 lymph node in the neck that was mildly hypermetabolic.   As noted he quit smoking about 2 weeks ago.  Still has coughing and wheezing.  Activities are largely limited by pain and shortness of breath.  Not having any consistent anginal type pain with activity.  Was told by cardiology that his heart was strong enough for surgery.  It was recommended he undergo Robotic Assisted Thoracoscopy with wedge resection vs possible lobectomy.  The risks and benefits of the procedure were explained to the patient and he was agreeable to proceed.    Hospital Course:  Keith Gibbs presented to [facility] on 05/09/2024.  He was taken to the operating room and underwent Robotic Assisted Right Video Thoracoscopy with Right Upper Lobectomy, Lymph Node Dissection, and intercostal nerve block.  He tolerated the procedure without difficulty, was extubated, and taken to the PACU in stable condition.  The patient had issues with pain control post operatively.  He has long standing chronic pain and his medications were adjusted to achieve adequate control.  The patient's chest xray showed a moderate pneumothorax on  the right side.  His chest tube exhibited a large air leak and was kept in place.  His central line and foley catheter was removed without difficulty.  His home medications were resumed.  On postop day 3 his chest x-ray showed his pneumothorax to be more enlarged compared to previous film with a ongoing large airleak he was placed back to suction.  He does have an expected acute blood loss anemia which is stabilized and is showing an improving trend.  Is maintaining normal renal.  He is voiding without difficulty.  The patient's chest xray showed some improvement of right apical pneumothorax.  His large air leak persisted.  The patient developed respiratory issues throughout yesterday and into the evening.  He developed desaturation with ARDS type picture.  He was placed on HFNC and ultimately transferred to the SICU for closer monitoring.  Critical care consult was obtained and the patient was treated with steroids and IV diuretics.  Echocardiogram was performed and showed normal LV function.  He continued to require increase oxygen demand.  Chest xray remained stable and large air leak persisted.  He remained marginally hypoxic with a 100% nonrebreather in addition to the 100% heated high flow nasal cannula oxygen.  BiPAP was attempted to improve oxygenation but he became increasingly agitated.  Decision was made on 05/19/2024 to reintubate and initiate mechanical ventilation.  His antibiotics were broadened over the weekend for sepsis like physiology.  He required pressor support with Levophed and Vasopressin , which were weaned as hemodynamics allowed.  He was started on tube feeding to help with nutrition.  Cortrak  placement was recommended and was accomplished at the bedside bu the registered dietician on 10/2 .  Venous duplex was ordered and revealed a thrombus in the gastrocnemius veins for which he was treated with Heparin .  Keith Gibbs's respiratory status remained tenuous.  The advanced heart failure team  was consulted and the decision was made to proceed with VV ECMO.  There was significant improvement in the blood gas as a result allowing a decrease in the ventilator tidal volume.  The air leak from the chest tube decreased as a result. Supportive care continued.  Suspected HCAP was treated with meropenem, vancomycin , and micafungin.  However respiratory and blood cultures showed candida species and Vancomycin  was discontinued.  ID consult was obtained to help guide antibiotic therapy.  They were in agreement with continued Micafungin and Meropenem. ECMO flow was increased to maintain oxygenation. Bivalirudin  was held and tracheostomy creation was performed without complication on 05/27/24. The patient developed seizure like activity when weaned off ketamine  as well as worsening bradycardia and drastic blood pressure changes. CT head 11/05 showed no acute intracranial abnormality but chronic encephalomalacia changes in the right occipital lobe. Diuresis was started for worsening volume status per AHF team. The patient underwent circuit exchange due to air entrainment and desaturated into the 30s. This quickly resolved and hemodynamics stabilized. Right sided chest tube remained in place with right apical space on CXR. There was no air leak. He remained on Levo and Vaso for hemodynamic support. Sedation was weaned and he started intermittently following commands. Levo and vaso was weaned off and he became hypertensive. Cleviprex was started, Oral hydralazine  was started and Cleviprex was weaned as tolerated.

## 2024-05-09 NOTE — Brief Op Note (Addendum)
 05/09/2024  3:20 PM  PATIENT:  Keith Gibbs  63 y.o. 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,N0)  PROCEDURES:    XI ROBOTIC ASSISTED RIGHT VIDEO THORACOSCOPY   -Wedge Resection Right Upper Lobe x 2 -Right Upper Lobectomy -Lymph Node Dissection -Intercostal Nerve Block  SURGEON:  Kerrin Elspeth BROCKS, MD   PHYSICIAN ASSISTANT: Erin Barrett PA-C, Myron Roddenberry PA-C  ASSISTANTS:Everhart, Veda FERNS, RN, Circulator        Tice, Ronal ORN, RN, Circulator        Goin, Newell SAUNDERS, RN, Scrub Person  ANESTHESIA:   general  EBL:   BLOOD ADMINISTERED:none  DRAINS: [address]   LOCAL MEDICATIONS USED:  BUPIVICAINE   SPECIMEN:  Source of Specimen:  Wedge Resection RUL x 2, Right Upper Lobe, Lymph Nodes  DISPOSITION OF SPECIMEN:  PATHOLOGY  COUNTS:  CORRECT  DICTATION: .Dragon Dictation  PLAN OF CARE: Admit to inpatient   PATIENT DISPOSITION:  PACU - hemodynamically stable.   Delay start of Pharmacological VTE agent (>24hrs) due to surgical blood loss or risk of bleeding: no

## 2024-05-09 NOTE — Op Note (Signed)
 NAME: Keith, Gibbs MEDICAL RECORD NO: [id number] ACCOUNT NO: [id number] DATE OF BIRTH: [birth year] FACILITY: [facility] LOCATION: [facility] PHYSICIAN: Elspeth BROCKS. Kerrin, MD  Operative Report   DATE OF PROCEDURE: 05/09/2024  PREOPERATIVE DIAGNOSIS:  Right upper lobe lung nodule, suspect stage IA non-small cell carcinoma.  POSTOPERATIVE DIAGNOSIS:  Adenocarcinoma right upper lobe, clinical stage IA (T1, N0).  PROCEDURE:   Xi robotic-assisted right thoracoscopy,  Right upper lobe wedge resection,  Right upper lobectomy,  Lymph node dissection, intercostal nerve blocks levels 3 through 10.  SURGEON:  Elspeth BROCKS. Kerrin, MD  ASSISTANT:  Rocky Shad, PA and Laurel Becket, [place].  Experienced assistance was necessary for this case due to surgical complexity.  Erin Barrett assisted with port placement and robot docking, instrument exchange, specimen retrieval, and suctioning.  Myron Roddenberry substituted in for Ms. Barrett and  also assisted with instrument exchange, suctioning, specimen retrieval, robot undocking, and wound closure.  ANESTHESIA:  General.  FINDINGS:  Initial frozen section only showed subpleural fibrosis.  Second frozen section showed adenocarcinoma of the lung.  Severe emphysema, extensive fatty tissue in hilum, major fissure complete, minor fissure incomplete, two posterior ascending branches.  CLINICAL NOTE:  Keith Gibbs is a 63 year old man with a history of polysubstance abuse including cigarette smoking until recently.  He had a COPD exacerbation in August and a CT showed an enlarging right upper lobe lung nodule.  The nodule was hypermetabolic on PET/CT.  He had no evidence of regional or distant metastatic disease.  He was offered surgical resection versus stereotactic radiation.  He strongly preferred surgical resection.  The indications, risks, benefits, and alternatives were discussed in detail with the patient.  He understood and accepted the risks and agreed  to proceed.  OPERATIVE NOTE:  Keith Gibbs was brought to the operating room on 05/09/2024.  He had induction of general anesthesia and was intubated with a double-lumen endotracheal tube.  Intravenous antibiotics were administered.  A Foley catheter was placed.  Sequential compression devices were placed on the calves for DVT prophylaxis.  He was placed in a left lateral decubitus position.  A Bair Hugger was placed for active warming.  The right chest was prepped and draped in the usual sterile fashion.   Single-lung ventilation of the left lung was initiated and was tolerated well throughout the procedure.  A timeout was performed.  A solution containing 20 mL of 5% bupivacaine , 30 mL of 0.5% bupivacaine , and 50 mL of saline was prepared.  This solution was used for local at the incision sites as well as for the intercostal nerve blocks.  A timeout was performed.  An incision was made in the 8th interspace in the midaxillary line.  An 8 mm robotic port was inserted.  After confirming intrapleural placement, carbon dioxide was insufflated per protocol.  A 12 mm robotic port was placed in the 8th interspace anterior to the camera port.  Intercostal nerve blocks then were performed from the 3rd to the 10th interspace by injecting 10 mL of bupivacaine  solution into a subpleural plane at each level.  A 12 mm AirSeal port was placed in the 10th interspace and then 2 additional 8th interspace robotic ports were placed.  The robot was deployed.  The camera arm was docked.  Targeting was performed.  The remaining arms were docked.  The robotic instruments were inserted with thoracoscopic  visualization.  There were some adhesions of the lung to the chest wall laterally.  These were thin filmy adhesions.  They  were taken down with bipolar cautery on the chest wall side.  The upper lobe was inspected.  There was a nodule in the lateral upper lobe.  Wedge resection was performed with sequential firings of the robotic  stapler.  The specimen was placed into an endoscopic retrieval bag, removed, and sent for frozen section.  While awaiting those results, lymph node dissection was initiated.  The inferior pulmonary ligament was divided.  A level 9 node was removed.  The pleural reflection was divided posteriorly and level 8 and level 7 nodes were removed.  There were multiple large, but otherwise benign-appearing level 7 nodes.  The patient did have relatively large bronchial arteries and had some mild bleeding throughout the case from those, but no major bleeding during the procedure.  One of the level 7 nodes was sent for frozen section and that subsequently returned with no tumor seen.  The frozen section of the nodule showed subpleural fibrosis.  The lung then was inspected and closer to the area of the adhesions, there was another nodular area with some slight invagination of the visceral pleura.  A second wedge resection was performed and the specimen was sent for frozen section, which subsequently returned showing adenocarcinoma.  The lung was retracted inferiorly and the pulmonary arteries were exposed.  There was extensive fatty tissue over the pulmonary arteries in the hilum.  Dissection was relatively slow in that area.  The level 10 node was removed.  The pleura overlying the mediastinum superior to the azygos vein was incised and there were enlarged, but otherwise benign-appearing level 4R nodes that were  removed.  Pleural reflection was divided to the hilum anteriorly.  The phrenic nerve was identified and no cautery was used in this vicinity.  The fissure then was inspected.  It was complete, but again had infiltration of fatty tissue.  The mid  portion of the fissure was complete.  The pleura overlying the pulmonary artery was incised and the dissection was carried posteriorly.  There was a very small area that was stapled posteriorly.  The minor fissure had extensive adhesions which were taken down, but the minor  fissure was incomplete.  While completing the major fissure, the posterior ascending branch was identified.  It was then encircled and divided using the robotic stapler.  As the dissection began on the minor fissure, a second  posterior ascending branch was identified just proximal to the first one.  This was divided later on during the procedure.  The division of the minor fissure was accomplished working both from an anterior and posterior approach.  The middle lobe  pulmonary vein was identified.  The remainder of the superior pulmonary vein was dissected out, encircled and divided with a robotic stapler.  The second posterior ascending branch then was divided.  Finally, the anterior apical trunk was encircled and  divided with a robotic stapler.  The stapler then was placed across the right upper lobe bronchus at its origin and closed.  A test inflation showed good aeration of the lower and middle lobes and the stapler was fired transecting the right upper lobe  bronchus.  Multiple level 12 and 13 nodes were removed during the dissection of the vessels.  All of those were sent for permanent pathology.  The middle lobe was tacked to the lower lobe in 2 places using the robotic stapler.  The sponges and vessel  loop used during the dissection were removed.  The chest was copiously irrigated with saline.  A  test inflation to 30 cm of water  revealed no leakage from the bronchial stump and no apparent air leakage from the staple line.  Progel was applied to the staple lines as a precautionary measure due to the patient's severe emphysema.  The robotic instruments were removed.  The robot was undocked.  The anterior 8th interspace incision was lengthened to 3 cm.  The right upper lobe was placed into an endoscopic  retrieval bag, removed, and sent for permanent pathology.  All the staple lines and port sites were inspected for hemostasis.  A 28-French chest tube was placed through the original port incision and  directed to the apex.  Dual lung ventilation was  resumed.  The remaining incisions were closed in standard fashion.  All sponge, needle, and instrument counts were correct at the end of the procedure.  The patient was placed back in the supine position.  The chest tube was placed to a Pleur-evac on waterseal.  The patient was extubated in the operating room and taken to the post-anesthesia care unit in good condition.   VAI D: 05/09/2024 6:16:08 pm T: 05/09/2024 10:18:00 pm  JOB: [id number]/ [id number]

## 2024-05-09 NOTE — Transfer of Care (Signed)
 Immediate Anesthesia Transfer of Care Note  Patient: Keith Gibbs  Procedure(s) Performed: WEDGE RESECTION, LUNG RIGHT UPPER LOBE, ROBOT-ASSISTED, THORACOSCOPIC (Right: Chest) LOBECTOMY, LUNG RIGHT UPPER LOBE, ROBOT-ASSISTED, USING VATS (Right: Chest) LYMPH NODE BIOPSY (Chest) BLOCK, NERVE, INTERCOSTAL (Chest)  Patient Location: PACU  Anesthesia Type:General  Level of Consciousness: awake, alert , and oriented  Airway & Oxygen Therapy: Patient Spontanous Breathing and Patient connected to face mask oxygen  Post-op Assessment: Report given to RN and Post -op Vital signs reviewed and stable  Post vital signs: Reviewed and stable  Last Vitals:  Vitals Value Taken Time  BP 143/80 05/09/24 16:48  Temp 36.7 C 05/09/24 16:48  Pulse 63 05/09/24 16:56  Resp 11 05/09/24 16:56  SpO2 97 % 05/09/24 16:56  Vitals shown include unfiled device data.  Last Pain:  Vitals:   05/09/24 1116  TempSrc:   PainSc: 0-No pain      Patients Stated Pain Goal: 5 (05/09/24 1051)  Complications: No notable events documented.

## 2024-05-09 NOTE — Interval H&P Note (Signed)
 History and Physical Interval Note:  05/09/2024 11:10 AM  Keith Gibbs  has presented today for surgery, with the diagnosis of RIGHT UPPER LOBE NODULE.  The various methods of treatment have been discussed with the patient and family. After consideration of risks, benefits and other options for treatment, the patient has consented to  Procedure(s) with comments: WEDGE RESECTION, LUNG, ROBOT-ASSISTED, THORACOSCOPIC (Right) - ROBOTIC RIGHT UPPER LOBE WEDGE RESECTION, possible upper lobectomy LOBECTOMY, LUNG, ROBOT-ASSISTED, USING VATS (Right) - possible upper lobectomy as a surgical intervention.  The patient's history has been reviewed, patient examined, no change in status, stable for surgery.  I have reviewed the patient's chart and labs.  Questions were answered to the patient's satisfaction.     [name]

## 2024-05-09 NOTE — Progress Notes (Signed)
 Pt has minimal air leak continuously, and moderate air leak with cough 5+ per pacu RN taylor Dr. Kerrin was at the bedside and aware.

## 2024-05-09 NOTE — Anesthesia Procedure Notes (Signed)
 Arterial Line Insertion Start/End10/16/2025 11:50 AM Performed by: Claudene Florina Boga, CRNA  Patient location: Pre-op. Preanesthetic checklist: patient identified, IV checked, site marked, risks and benefits discussed, surgical consent, monitors and equipment checked, pre-op evaluation, timeout performed and anesthesia consent Lidocaine  1% used for infiltration Left, radial was placed Catheter size: 20 G Hand hygiene performed  and maximum sterile barriers used   Attempts: 1 Following insertion, dressing applied and Biopatch. Post procedure assessment: normal and unchanged  Patient tolerated the procedure well with no immediate complications.

## 2024-05-09 NOTE — Anesthesia Procedure Notes (Addendum)
 Procedure Name: Intubation Date/Time: 05/09/2024 12:18 PM  Performed by: Jerl Donald LABOR, CRNAPre-anesthesia Checklist: Patient identified, Emergency Drugs available, Suction available and Patient being monitored Patient Re-evaluated:Patient Re-evaluated prior to induction Oxygen Delivery Method: Circle System Utilized Preoxygenation: Pre-oxygenation with 100% oxygen Induction Type: IV induction Ventilation: Mask ventilation without difficulty and Oral airway inserted - appropriate to patient size Laryngoscope Size: Mac and 4 Grade View: Grade I Tube type: Oral Endobronchial tube: Left and Double lumen EBT and 41 Fr Number of attempts: 1 Airway Equipment and Method: Stylet and Oral airway Placement Confirmation: ETT inserted through vocal cords under direct vision, positive ETCO2 and breath sounds checked- equal and bilateral Tube secured with: Tape Dental Injury: Teeth and Oropharynx as per pre-operative assessment

## 2024-05-10 ENCOUNTER — Other Ambulatory Visit: Payer: Self-pay

## 2024-05-10 ENCOUNTER — Inpatient Hospital Stay (HOSPITAL_COMMUNITY): Payer: MEDICAID

## 2024-05-10 ENCOUNTER — Encounter (HOSPITAL_COMMUNITY): Payer: Self-pay | Admitting: Thoracic Surgery (Cardiothoracic Vascular Surgery)

## 2024-05-10 ENCOUNTER — Ambulatory Visit (HOSPITAL_COMMUNITY): Payer: MEDICAID

## 2024-05-10 DIAGNOSIS — J95811 Postprocedural pneumothorax: Secondary | ICD-10-CM

## 2024-05-10 DIAGNOSIS — J95812 Postprocedural air leak: Secondary | ICD-10-CM

## 2024-05-10 DIAGNOSIS — Z902 Acquired absence of lung [part of]: Secondary | ICD-10-CM

## 2024-05-10 LAB — BASIC METABOLIC PANEL WITH GFR
Anion gap: 9 (ref 5–15)
BUN: 15 mg/dL (ref 8–23)
CO2: 24 mmol/L (ref 22–32)
Calcium: 8.2 mg/dL — ABNORMAL LOW (ref 8.9–10.3)
Chloride: 100 mmol/L (ref 98–111)
Creatinine, Ser: 1.09 mg/dL (ref 0.61–1.24)
GFR, Estimated: >60 mL/min (ref >60)
Glucose, Bld: 127 mg/dL — ABNORMAL HIGH (ref 70–99)
Potassium: 4 mmol/L (ref 3.5–5.1)
Sodium: 133 mmol/L — ABNORMAL LOW (ref 135–145)

## 2024-05-10 LAB — CBC
HCT: 33.3 % — ABNORMAL LOW (ref 39.0–52.0)
Hemoglobin: 11.7 g/dL — ABNORMAL LOW (ref 13.0–17.0)
MCH: 33.3 pg (ref 26.0–34.0)
MCHC: 35.1 g/dL (ref 30.0–36.0)
MCV: 94.9 fL (ref 80.0–100.0)
Platelets: 131 K/uL — ABNORMAL LOW (ref 150–400)
RBC: 3.51 MIL/uL — ABNORMAL LOW (ref 4.22–5.81)
RDW: 13.3 % (ref 11.5–15.5)
WBC: 11 K/uL — ABNORMAL HIGH (ref 4.0–10.5)
nRBC: 0 % (ref 0.0–0.2)

## 2024-05-10 MED ORDER — CHLORHEXIDINE GLUCONATE CLOTH 2 % EX PADS
6.0000 | MEDICATED_PAD | Freq: Every day | CUTANEOUS | Status: DC
Start: 2024-05-10 — End: 2024-06-04
  Administered 2024-05-10 – 2024-06-04 (×25): 6 via TOPICAL

## 2024-05-10 MED ORDER — SODIUM CHLORIDE 0.9% FLUSH: 10.0000 mL | INTRAVENOUS | Status: DC | PRN

## 2024-05-10 MED ORDER — OXYCODONE HCL 5 MG PO TABS
10.0000 mg | ORAL_TABLET | ORAL | Status: DC | PRN
  Administered 2024-05-10 – 2024-05-11 (×3): 15 mg via ORAL
  Administered 2024-05-11: 10 mg via ORAL
  Administered 2024-05-11 – 2024-05-14 (×10): 15 mg via ORAL
  Administered 2024-05-14: 10 mg via ORAL
  Administered 2024-05-14: 15 mg via ORAL
  Administered 2024-05-15: 10 mg via ORAL
  Administered 2024-05-15 – 2024-05-16 (×4): 15 mg via ORAL
  Filled 2024-05-10: qty 3
  Filled 2024-05-10: qty 2
  Filled 2024-05-10 (×7): qty 3
  Filled 2024-05-10: qty 2
  Filled 2024-05-10 (×3): qty 3
  Filled 2024-05-10: qty 2
  Filled 2024-05-10 (×8): qty 3
  Filled 2024-05-10: qty 2
  Filled 2024-05-10: qty 3

## 2024-05-10 MED ORDER — FENTANYL CITRATE (PF) 50 MCG/ML IJ SOSY
25.0000 ug | PREFILLED_SYRINGE | INTRAMUSCULAR | Status: DC | PRN
Start: 2024-05-10 — End: 2024-05-16
  Administered 2024-05-10 – 2024-05-16 (×27): 25 ug via INTRAVENOUS
  Filled 2024-05-10 (×30): qty 1

## 2024-05-10 MED ORDER — SODIUM CHLORIDE 0.9% FLUSH
10.0000 mL | Freq: Two times a day (BID) | INTRAVENOUS | Status: DC
  Administered 2024-05-10 – 2024-05-22 (×24): 10 mL
  Administered 2024-05-22: 20 mL
  Administered 2024-05-23: 10 mL
  Administered 2024-05-23: 20 mL
  Administered 2024-05-24: 10 mL
  Administered 2024-05-24: 40 mL
  Administered 2024-05-25: 10 mL
  Administered 2024-05-25: 40 mL
  Administered 2024-05-26 – 2024-06-02 (×14): 10 mL
  Administered 2024-06-03: 11 mL
  Administered 2024-06-03 – 2024-06-04 (×2): 10 mL

## 2024-05-10 NOTE — Progress Notes (Signed)
 Mobility Specialist Progress Note:    05/10/24 1130  Mobility  Activity Ambulated with assistance;Pivoted/transferred from bed to chair  Level of Assistance Minimal assist, patient does 75% or more  Assistive Device Front wheel walker  Distance Ambulated (ft) 240 ft  Activity Response Tolerated well  Mobility Referral Yes  Mobility visit 1 Mobility  Mobility Specialist Start Time (ACUTE ONLY) 1116  Mobility Specialist Stop Time (ACUTE ONLY) 1131  Mobility Specialist Time Calculation (min) (ACUTE ONLY) 15 min   Pt received in bed, eager for mobility session. Ambulated in hallway with MinA. Tolerated well, asx throughout. Returned pt to room, sitting up in chair, mother at bedside. All needs met.   Tadan Shill Mobility Specialist Please contact via Special educational needs teacher or  Rehab office at [phone]

## 2024-05-10 NOTE — Anesthesia Postprocedure Evaluation (Signed)
 Anesthesia Post Note  Patient: Lin Glazier Shank  Procedure(s) Performed: WEDGE RESECTION, LUNG RIGHT UPPER LOBE, ROBOT-ASSISTED, THORACOSCOPIC (Right: Chest) LOBECTOMY, LUNG RIGHT UPPER LOBE, ROBOT-ASSISTED, USING VATS (Right: Chest) LYMPH NODE BIOPSY (Chest) BLOCK, NERVE, INTERCOSTAL (Chest)     Patient location during evaluation: PACU Anesthesia Type: General Level of consciousness: awake and alert Pain management: pain level controlled Vital Signs Assessment: post-procedure vital signs reviewed and stable Respiratory status: spontaneous breathing, nonlabored ventilation, respiratory function stable and patient connected to nasal cannula oxygen Cardiovascular status: blood pressure returned to baseline and stable Postop Assessment: no apparent nausea or vomiting Anesthetic complications: no   No notable events documented.  Last Vitals:  Vitals:   05/09/24 2235 05/10/24 0442  BP: 112/79 135/80  Pulse: 72 64  Resp: 20 20  Temp: 36.5 C 36.6 C  SpO2: 94% 96%    Last Pain:  Vitals:   05/10/24 1046  TempSrc:   PainSc: 8                  Epifanio Lamar BRAVO

## 2024-05-10 NOTE — Progress Notes (Addendum)
      [address] [place] [employer] [address]             [phone]         1 Day Post-Op Procedure(s) (LRB): WEDGE RESECTION, LUNG RIGHT UPPER LOBE, ROBOT-ASSISTED, THORACOSCOPIC (Right) LOBECTOMY, LUNG RIGHT UPPER LOBE, ROBOT-ASSISTED, USING VATS (Right) LYMPH NODE BIOPSY BLOCK, NERVE, INTERCOSTAL  Subjective:  Patient having a lot of pain.  Not getting much relief with pain medication.  He also states his chest tube area was leaking as he felt it running down his back last night.  Encouraged to get up and ambulate today.  Objective: Vital signs in last 24 hours: Temp:  [97.7 F (36.5 C)-98.5 F (36.9 C)] 97.8 F (36.6 C) (10/17 0442) Pulse Rate:  [52-78] 64 (10/17 0442) Cardiac Rhythm: Normal sinus rhythm (10/16 2000) Resp:  [10-21] 20 (10/17 0442) BP: (98-143)/(72-91) 135/80 (10/17 0442) SpO2:  [92 %-98 %] 96 % (10/17 0442)  Intake/Output from previous day: 10/16 0701 - 10/17 0700 In: 2960 [P.O.:360; I.V.:2000; IV Piggyback:600] Out: 1300 [Urine:1175; Blood:75; Chest Tube:50]  General appearance: alert, cooperative, and no distress Heart: regular rate and rhythm Lungs: clear to auscultation bilaterally Abdomen: soft, non-tender; bowel sounds normal; no masses,  no organomegaly Extremities: extremities normal, atraumatic, no cyanosis or edema Wound: clean and dry  Lab Results: Recent Labs    05/07/24 1400 05/10/24 0500  WBC 6.7 11.0*  HGB 13.8 11.7*  HCT 40.3 33.3*  PLT 156 131*   BMET:  Recent Labs    05/07/24 1400 05/10/24 0500  NA 132* 133*  K 4.4 4.0  CL 97* 100  CO2 27 24  GLUCOSE 86 127*  BUN 15 15  CREATININE 1.04 1.09  CALCIUM  9.1 8.2*    PT/INR:  Recent Labs    05/07/24 1400  LABPROT 14.0  INR 1.0   ABG    Component Value Date/Time   PHART 7.355 08/23/2022 1326   HCO3 29.0 (H) 03/20/2024 1236   TCO2 28 05/30/2023 1229   ACIDBASEDEF 1.0 02/17/2022 0502   O2SAT 80.8 03/20/2024 1236   CBG (last 3)  No  results for input(s): GLUCAP in the last 72 hours.  Assessment/Plan: S/P Procedure(s) (LRB): WEDGE RESECTION, LUNG RIGHT UPPER LOBE, ROBOT-ASSISTED, THORACOSCOPIC (Right) LOBECTOMY, LUNG RIGHT UPPER LOBE, ROBOT-ASSISTED, USING VATS (Right) LYMPH NODE BIOPSY BLOCK, NERVE, INTERCOSTAL  CV- NSR, H/O HTN- continue Imdur  at reduced home dose, will titrate as BP allows Pulm- CXR with moderate pneumothorax on right..  CT on water  seal with large air leak, minimal output since surgery.. May benefit from suction for 24 hours to help get lung up will defer to Dr. Kerrin Renal- creatinine at 1.09, monitor... no toradol  at this time Pain control- chronic pain issues,  not getting much relief with current pain medications.. will increase Oxycodone , stop Morphine  and transition to Fentanyl  D/C Central line, foley catheter, IV fluids Continue Lovenox  for DVT prophylaxis   LOS: 1 day    Rocky Shad, PA-C 05/10/2024 7:55 AM  Patient seen and examined, plan outlined above Pain issues being addressed Ambulate + air leak with a lot of tidal movement- will leave CT to water  seal today  Elspeth C. Kerrin, MD [facility] [phone]

## 2024-05-10 NOTE — Discharge Instructions (Signed)
 Discharge Instructions:  1. You may shower, please wash incisions daily with soap and water and keep dry.  If you wish to cover wounds with dressing you may do so but please keep clean and change daily.  No tub baths or swimming until incisions have completely healed.  If your incisions become red or develop any drainage please call our office at [phone]  2. No Driving until cleared by Dr. Chrystal office and you are no longer using narcotic pain medications  3. Fever of 101.5 for at least 24 hours with no source, please contact our office at [phone]  4. Activity- up as tolerated, please walk at least 3 times per day.  Avoid strenuous activity  5. If any questions or concerns arise, please do not hesitate to contact our office at [phone]

## 2024-05-10 NOTE — Progress Notes (Signed)
   05/10/24 1559  TOC Brief Assessment  Insurance and Status Reviewed  Patient has primary care physician Yes  Home environment has been reviewed home w/ mom  Prior level of function: self  Prior/Current Home Services No current home services  Social Drivers of Health Review SDOH reviewed no interventions necessary  Readmission risk has been reviewed Yes  Transition of care needs no transition of care needs at this time    Inpatient Care Management (ICM) will continue to monitor patient advancement through interdisciplinary progression rounds. If new patient transition needs arise, please place a ICM (CM/CSW) consult.

## 2024-05-10 NOTE — Plan of Care (Signed)

## 2024-05-11 ENCOUNTER — Inpatient Hospital Stay (HOSPITAL_COMMUNITY): Payer: MEDICAID

## 2024-05-11 LAB — COMPREHENSIVE METABOLIC PANEL WITH GFR
ALT: 11 U/L (ref 0–44)
AST: 24 U/L (ref 15–41)
Albumin: 2.9 g/dL — ABNORMAL LOW (ref 3.5–5.0)
Alkaline Phosphatase: 35 U/L — ABNORMAL LOW (ref 38–126)
Anion gap: 7 (ref 5–15)
BUN: 17 mg/dL (ref 8–23)
CO2: 28 mmol/L (ref 22–32)
Calcium: 7.8 mg/dL — ABNORMAL LOW (ref 8.9–10.3)
Chloride: 99 mmol/L (ref 98–111)
Creatinine, Ser: 1.19 mg/dL (ref 0.61–1.24)
GFR, Estimated: >60 mL/min (ref >60)
Glucose, Bld: 122 mg/dL — ABNORMAL HIGH (ref 70–99)
Potassium: 3.6 mmol/L (ref 3.5–5.1)
Sodium: 134 mmol/L — ABNORMAL LOW (ref 135–145)
Total Bilirubin: 0.5 mg/dL (ref 0.0–1.2)
Total Protein: 5.3 g/dL — ABNORMAL LOW (ref 6.5–8.1)

## 2024-05-11 LAB — CBC
HCT: 29.8 % — ABNORMAL LOW (ref 39.0–52.0)
Hemoglobin: 10.2 g/dL — ABNORMAL LOW (ref 13.0–17.0)
MCH: 33.2 pg (ref 26.0–34.0)
MCHC: 34.2 g/dL (ref 30.0–36.0)
MCV: 97.1 fL (ref 80.0–100.0)
Platelets: 114 K/uL — ABNORMAL LOW (ref 150–400)
RBC: 3.07 MIL/uL — ABNORMAL LOW (ref 4.22–5.81)
RDW: 13.3 % (ref 11.5–15.5)
WBC: 8.2 K/uL (ref 4.0–10.5)
nRBC: 0 % (ref 0.0–0.2)

## 2024-05-11 NOTE — Plan of Care (Signed)

## 2024-05-11 NOTE — Plan of Care (Signed)
  Problem: Education: Goal: Knowledge of General Education information will improve Description: Including pain rating scale, medication(s)/side effects and non-pharmacologic comfort measures Outcome: Progressing   Problem: Health Behavior/Discharge Planning: Goal: Ability to manage health-related needs will improve Outcome: Progressing   Problem: Clinical Measurements: Goal: Respiratory complications will improve Outcome: Progressing   Problem: Activity: Goal: Risk for activity intolerance will decrease Outcome: Progressing   Problem: Elimination: Goal: Will not experience complications related to bowel motility Outcome: Progressing

## 2024-05-11 NOTE — Progress Notes (Addendum)
 2 Days Post-Op Procedure(s) (LRB): WEDGE RESECTION, LUNG RIGHT UPPER LOBE, ROBOT-ASSISTED, THORACOSCOPIC (Right) LOBECTOMY, LUNG RIGHT UPPER LOBE, ROBOT-ASSISTED, USING VATS (Right) LYMPH NODE BIOPSY BLOCK, NERVE, INTERCOSTAL Subjective: Feels ok, some pain  Objective: Vital signs in last 24 hours: Temp:  [97.5 F (36.4 C)-98.3 F (36.8 C)] 97.5 F (36.4 C) (10/18 0402) Pulse Rate:  [50-65] 51 (10/18 0610) Cardiac Rhythm: Normal sinus rhythm (10/17 1900) Resp:  [13-20] 20 (10/18 0402) BP: (108-130)/(60-67) 110/60 (10/18 0402) SpO2:  [95 %-98 %] 98 % (10/18 0610) Weight:  [118.7 kg] 118.7 kg (10/18 0610)  Hemodynamic parameters for last 24 hours:    Intake/Output from previous day: 10/17 0701 - 10/18 0700 In: 720 [P.O.:720] Out: 300 [Urine:300] Intake/Output this shift: No intake/output data recorded.  General appearance: alert, cooperative, and no distress Heart: regular rate and rhythm Lungs: coarse right base Abdomen: benign Extremities: no edema or calf tenderness Wound: large bandage, CDI  Lab Results: Recent Labs    05/10/24 0500 05/11/24 0347  WBC 11.0* 8.2  HGB 11.7* 10.2*  HCT 33.3* 29.8*  PLT 131* 114*   BMET:  Recent Labs    05/10/24 0500 05/11/24 0347  NA 133* 134*  K 4.0 3.6  CL 100 99  CO2 24 28  GLUCOSE 127* 122*  BUN 15 17  CREATININE 1.09 1.19  CALCIUM  8.2* 7.8*    PT/INR: No results for input(s): LABPROT, INR in the last 72 hours. ABG    Component Value Date/Time   PHART 7.355 08/23/2022 1326   HCO3 29.0 (H) 03/20/2024 1236   TCO2 28 05/30/2023 1229   ACIDBASEDEF 1.0 02/17/2022 0502   O2SAT 80.8 03/20/2024 1236   CBG (last 3)  No results for input(s): GLUCAP in the last 72 hours.  Meds Scheduled Meds:  acetaminophen   1,000 mg Oral Q6H   Or   acetaminophen  (TYLENOL ) oral liquid 160 mg/5 mL  1,000 mg Oral Q6H   aspirin  EC  81 mg Oral Daily   atorvastatin   80 mg Oral Daily   bisacodyl   10 mg Oral Daily    budesonide -glycopyrrolate -formoterol   2 puff Inhalation BID   celecoxib   200 mg Oral BID   Chlorhexidine  Gluconate Cloth  6 each Topical Daily   divalproex   1,000 mg Oral QHS   divalproex   250 mg Oral q morning   divalproex   500 mg Oral Q lunch   DULoxetine   30 mg Oral TID   enoxaparin  (LOVENOX ) injection  40 mg Subcutaneous Daily   gabapentin   600 mg Oral Daily   And   gabapentin   1,200 mg Oral QHS   hydrOXYzine   25 mg Oral Daily   isosorbide  mononitrate  30 mg Oral Daily   pantoprazole   40 mg Oral Daily   QUEtiapine   200 mg Oral QHS   senna-docusate  1 tablet Oral QHS   sodium chloride  flush  10-40 mL Intracatheter Q12H   Continuous Infusions: PRN Meds:.albuterol , fentaNYL  (SUBLIMAZE ) injection, hydrALAZINE , ondansetron  (ZOFRAN ) IV, oxyCODONE , sodium chloride  flush, traMADol   Xrays DG CHEST PORT 1 VIEW Result Date: 05/11/2024 EXAM: 1 VIEW(S) XRAY OF THE CHEST 05/11/2024 06:45:00 AM COMPARISON: Portable chest yesterday at 5:34 am. CLINICAL HISTORY: S/P lobectomy of lung. FINDINGS: LINES, TUBES AND DEVICES: Right chest tube remains intact with the tip in the lateral apex. Right IJ central line has been removed. LUNGS AND PLEURA: Status post right upper lobectomy. There is pneumothorax with apical pleural margin with 7.1 cm pleuroparenchymal separation, unchanged. There is mild right chest volume loss. There  is atelectasis along the apex of the residual right lung. The lungs are otherwise clear. No pulmonary edema. No pleural effusion. HEART AND MEDIASTINUM: The cardiomediastinal silhouette is stable. No vascular congestion is seen. BONES AND SOFT TISSUES: No new osseous finding. IMPRESSION: 1. Status post right upper lobectomy with unchanged right apical pneumothorax (pleuroparenchymal separation 7.1 cm). 2. Atelectasis along the apex of the residual right lung. 3. Right chest tube in place with the tip in the lateral apex. Electronically signed by: Francis Quam MD 05/11/2024 07:10 AM EDT RP  Workstation: [id number]   DG Chest Port 1 View Result Date: 05/10/2024 CLINICAL DATA:  Status post lobectomy. EXAM: PORTABLE CHEST 1 VIEW COMPARISON:  05/09/2024 FINDINGS: The cardio pericardial silhouette is enlarged. Right IJ central line remains in place. Right chest tube remains in place with persistent small apical right pneumothorax. Interstitial markings are diffusely coarsened with chronic features. No substantial pleural effusion. Telemetry leads overlie the chest. IMPRESSION: Persistent small apical right pneumothorax with right chest tube in place. Electronically Signed   By: Camellia Candle M.D.   On: 05/10/2024 07:21   DG Chest Port 1 View Result Date: 05/09/2024 CLINICAL DATA:  Status post lobectomy of lung. EXAM: PORTABLE CHEST 1 VIEW COMPARISON:  05/07/2024 FINDINGS: Postoperative changes in the right chest with a right chest tube. Right chest tube tip is near the right lung apex. There is lucency at the right lung apex and findings are suggestive for small pneumothorax but a pleural line is not well identified. Right jugular central line with the tip in the SVC region. Slight enlargement of the mediastinal silhouette probably related to recent surgery. Increased interstitial densities in left lung compatible with mild edema and/or volume loss. Negative for left pneumothorax. Right shoulder arthroplasty. IMPRESSION: 1. Postoperative changes in the right chest with a right chest tube. Evidence for a small right apical pneumothorax. 2. Increased interstitial densities in the left lung. Findings could represent mild edema and/or volume loss. Electronically Signed   By: Juliene Balder M.D.   On: 05/09/2024 17:53    Assessment/Plan: S/P Procedure(s) (LRB): WEDGE RESECTION, LUNG RIGHT UPPER LOBE, ROBOT-ASSISTED, THORACOSCOPIC (Right) LOBECTOMY, LUNG RIGHT UPPER LOBE, ROBOT-ASSISTED, USING VATS (Right) LYMPH NODE BIOPSY BLOCK, NERVE, INTERCOSTAL POD#2  1 afeb, VSS, sinus rhythm/Sinus brady 2  O2 sats good on RA 3 voiding- unmeasured totals 4 CXR -  appearance is stable c/w yesterday 5 normal renal fxn but creat trend is increased, minor hyponatremia w/ sodium 134- cont to monitor 6 prot -cal malnutrition based on albumin  and protein levels- push nutrition as able 7 slightly low alk phos at 35- monitor clinically 8 leukocytosis resolved 9 Expected ABLA- cont to monitor, a bit lower on trend, not at transfusion threshold 10 CT amts not recorded, sero sang drainge, + large air leak, cont to H2o seal for now 11 pulm hygiene, routine rehab, lovenox  for DVT ppx    LOS: 2 days    Lemond FORBES Cera PA-C Pager [phone] 05/11/2024  Continue CT management for leak  Jacarra Bobak O Tawnya Pujol

## 2024-05-12 ENCOUNTER — Inpatient Hospital Stay (HOSPITAL_COMMUNITY): Payer: MEDICAID

## 2024-05-12 LAB — CBC
HCT: 31.6 % — ABNORMAL LOW (ref 39.0–52.0)
Hemoglobin: 10.8 g/dL — ABNORMAL LOW (ref 13.0–17.0)
MCH: 33.2 pg (ref 26.0–34.0)
MCHC: 34.2 g/dL (ref 30.0–36.0)
MCV: 97.2 fL (ref 80.0–100.0)
Platelets: 127 K/uL — ABNORMAL LOW (ref 150–400)
RBC: 3.25 MIL/uL — ABNORMAL LOW (ref 4.22–5.81)
RDW: 13.2 % (ref 11.5–15.5)
WBC: 8.1 K/uL (ref 4.0–10.5)
nRBC: 0 % (ref 0.0–0.2)

## 2024-05-12 LAB — BASIC METABOLIC PANEL WITH GFR
Anion gap: 7 (ref 5–15)
BUN: 13 mg/dL (ref 8–23)
CO2: 26 mmol/L (ref 22–32)
Calcium: 7.9 mg/dL — ABNORMAL LOW (ref 8.9–10.3)
Chloride: 101 mmol/L (ref 98–111)
Creatinine, Ser: 0.93 mg/dL (ref 0.61–1.24)
GFR, Estimated: >60 mL/min (ref >60)
Glucose, Bld: 109 mg/dL — ABNORMAL HIGH (ref 70–99)
Potassium: 4 mmol/L (ref 3.5–5.1)
Sodium: 134 mmol/L — ABNORMAL LOW (ref 135–145)

## 2024-05-12 MED ORDER — LACTULOSE 10 GM/15ML PO SOLN
30.0000 g | Freq: Every day | ORAL | Status: DC | PRN
  Administered 2024-05-12 – 2024-05-13 (×2): 30 g via ORAL
  Filled 2024-05-12 (×2): qty 45

## 2024-05-12 MED ORDER — HYDROXYZINE HCL 10 MG PO TABS
10.0000 mg | ORAL_TABLET | Freq: Three times a day (TID) | ORAL | Status: DC | PRN
  Administered 2024-05-12 – 2024-05-16 (×3): 10 mg via ORAL
  Filled 2024-05-12 (×5): qty 1

## 2024-05-12 NOTE — Plan of Care (Signed)
  Problem: Education: Goal: Knowledge of General Education information will improve Description: Including pain rating scale, medication(s)/side effects and non-pharmacologic comfort measures Outcome: Progressing   Problem: Health Behavior/Discharge Planning: Goal: Ability to manage health-related needs will improve Outcome: Progressing   Problem: Clinical Measurements: Goal: Ability to maintain clinical measurements within normal limits will improve Outcome: Progressing Goal: Respiratory complications will improve Outcome: Progressing   Problem: Pain Managment: Goal: General experience of comfort will improve and/or be controlled Outcome: Progressing   Problem: Education: Goal: Knowledge of disease or condition will improve Outcome: Progressing Goal: Knowledge of the prescribed therapeutic regimen will improve Outcome: Progressing   Problem: Activity: Goal: Risk for activity intolerance will decrease Outcome: Progressing   Problem: Cardiac: Goal: Will achieve and/or maintain hemodynamic stability Outcome: Progressing   Problem: Clinical Measurements: Goal: Postoperative complications will be avoided or minimized Outcome: Progressing   Problem: Respiratory: Goal: Respiratory status will improve Outcome: Progressing   Problem: Pain Management: Goal: Pain level will decrease Outcome: Progressing   Problem: Skin Integrity: Goal: Wound healing without signs and symptoms infection will improve Outcome: Progressing

## 2024-05-12 NOTE — Plan of Care (Signed)

## 2024-05-12 NOTE — Progress Notes (Addendum)
 3 Days Post-Op Procedure(s) (LRB): WEDGE RESECTION, LUNG RIGHT UPPER LOBE, ROBOT-ASSISTED, THORACOSCOPIC (Right) LOBECTOMY, LUNG RIGHT UPPER LOBE, ROBOT-ASSISTED, USING VATS (Right) LYMPH NODE BIOPSY BLOCK, NERVE, INTERCOSTAL Subjective: Some pain  Objective: Vital signs in last 24 hours: Temp:  [97.6 F (36.4 C)-97.9 F (36.6 C)] 97.7 F (36.5 C) (10/19 0359) Pulse Rate:  [58-70] 59 (10/18 1602) Cardiac Rhythm: Normal sinus rhythm (10/18 1900) Resp:  [17-20] 20 (10/19 0359) BP: (95-135)/(58-101) 117/69 (10/19 0359) SpO2:  [96 %-99 %] 96 % (10/18 2000) Weight:  [115.4 kg] 115.4 kg (10/19 0359)  Hemodynamic parameters for last 24 hours:    Intake/Output from previous day: 10/18 0701 - 10/19 0700 In: 240 [P.O.:240] Out: 320 [Chest Tube:320] Intake/Output this shift: No intake/output data recorded.  General appearance: alert, cooperative, and no distress Heart: regular rate and rhythm Lungs: coarse and dim right base Abdomen: benign Extremities: no edema or calf tenderness Wound: dressings CDI currently  Lab Results: Recent Labs    05/11/24 0347 05/12/24 0357  WBC 8.2 8.1  HGB 10.2* 10.8*  HCT 29.8* 31.6*  PLT 114* 127*   BMET:  Recent Labs    05/11/24 0347 05/12/24 0357  NA 134* 134*  K 3.6 4.0  CL 99 101  CO2 28 26  GLUCOSE 122* 109*  BUN 17 13  CREATININE 1.19 0.93  CALCIUM  7.8* 7.9*    PT/INR: No results for input(s): LABPROT, INR in the last 72 hours. ABG    Component Value Date/Time   PHART 7.355 08/23/2022 1326   HCO3 29.0 (H) 03/20/2024 1236   TCO2 28 05/30/2023 1229   ACIDBASEDEF 1.0 02/17/2022 0502   O2SAT 80.8 03/20/2024 1236   CBG (last 3)  No results for input(s): GLUCAP in the last 72 hours.  Meds Scheduled Meds:  acetaminophen   1,000 mg Oral Q6H   Or   acetaminophen  (TYLENOL ) oral liquid 160 mg/5 mL  1,000 mg Oral Q6H   aspirin  EC  81 mg Oral Daily   atorvastatin   80 mg Oral Daily   bisacodyl   10 mg Oral Daily    budesonide -glycopyrrolate -formoterol   2 puff Inhalation BID   celecoxib   200 mg Oral BID   Chlorhexidine  Gluconate Cloth  6 each Topical Daily   divalproex   1,000 mg Oral QHS   divalproex   250 mg Oral q morning   divalproex   500 mg Oral Q lunch   DULoxetine   30 mg Oral TID   enoxaparin  (LOVENOX ) injection  40 mg Subcutaneous Daily   gabapentin   600 mg Oral Daily   And   gabapentin   1,200 mg Oral QHS   hydrOXYzine   25 mg Oral Daily   isosorbide  mononitrate  30 mg Oral Daily   pantoprazole   40 mg Oral Daily   QUEtiapine   200 mg Oral QHS   senna-docusate  1 tablet Oral QHS   sodium chloride  flush  10-40 mL Intracatheter Q12H   Continuous Infusions: PRN Meds:.albuterol , fentaNYL  (SUBLIMAZE ) injection, hydrALAZINE , ondansetron  (ZOFRAN ) IV, oxyCODONE , sodium chloride  flush, traMADol   Xrays DG Chest 1 View Result Date: 05/12/2024 EXAM: 1 VIEW(S) XRAY OF THE CHEST 05/12/2024 05:30:00 AM COMPARISON: Portable chest yesterday at 6:32 AM. CLINICAL HISTORY: 450301 Surgery follow-up 450301. Reason for exam: Surgery follow-up; Principal Problem: S/P lobectomy of lung Surgery follow-up; Principal Problem: S/P lobectomy of lung. FINDINGS: LINES, TUBES AND DEVICES: Stable right chest tube positioning, the tip in the lateral apical region. LUNGS AND PLEURA: Pneumothorax of the superior right thorax is increased; previously the pleuroparenchymal separation was 7.1  cm, now 8.6 cm. The lungs remain clear of infiltrates. No pulmonary edema. No pleural effusion. Status post right upper lobectomy. HEART AND MEDIASTINUM: Stable mild cardiomegaly. No evidence of CHF. Stable mediastinum. BONES AND SOFT TISSUES: No acute osseous abnormality. IMPRESSION: 1. Increased right apical pneumothorax, now 8.6 cm of pleuroparenchymal separation previously 7.1 cm. 2. Stable right chest tube positioning. 3. Status post right upper lobectomy. 4. Stable mild cardiomegaly without congestive heart failure. 5. . These results will be  telephoned to the referring provider or the referring providers representative by professional radiology assistant [facility]) personnel, with communication documented in the [facility] dashboard. Electronically signed by: Francis Quam MD 05/12/2024 05:52 AM EDT RP Workstation: [id number]   DG CHEST PORT 1 VIEW Result Date: 05/11/2024 EXAM: 1 VIEW(S) XRAY OF THE CHEST 05/11/2024 06:45:00 AM COMPARISON: Portable chest yesterday at 5:34 am. CLINICAL HISTORY: S/P lobectomy of lung. FINDINGS: LINES, TUBES AND DEVICES: Right chest tube remains intact with the tip in the lateral apex. Right IJ central line has been removed. LUNGS AND PLEURA: Status post right upper lobectomy. There is pneumothorax with apical pleural margin with 7.1 cm pleuroparenchymal separation, unchanged. There is mild right chest volume loss. There is atelectasis along the apex of the residual right lung. The lungs are otherwise clear. No pulmonary edema. No pleural effusion. HEART AND MEDIASTINUM: The cardiomediastinal silhouette is stable. No vascular congestion is seen. BONES AND SOFT TISSUES: No new osseous finding. IMPRESSION: 1. Status post right upper lobectomy with unchanged right apical pneumothorax (pleuroparenchymal separation 7.1 cm). 2. Atelectasis along the apex of the residual right lung. 3. Right chest tube in place with the tip in the lateral apex. Electronically signed by: Francis Quam MD 05/11/2024 07:10 AM EDT RP Workstation: [id number]    Assessment/Plan: S/P Procedure(s) (LRB): WEDGE RESECTION, LUNG RIGHT UPPER LOBE, ROBOT-ASSISTED, THORACOSCOPIC (Right) LOBECTOMY, LUNG RIGHT UPPER LOBE, ROBOT-ASSISTED, USING VATS (Right) LYMPH NODE BIOPSY BLOCK, NERVE, INTERCOSTAL POD#3  1 afeb, VSS, sinus rhythm/brady 2 O2 sats good on RA 3 voiding- not measured 4 CT 320 ml/24 h recorded, some drainage around the tube as well- will place to suction today 5 CXR - apical space a little larger 6 renal fxn normal 7 H/H improved 8  thrombocytopenia is pretty stable, somewhat chronic issue 9 lovenox  for DVT ppx 10 pulm hygiene and rehab- routine   LOS: 3 days    Lemond FORBES Cera PA-C Pager [phone] 05/12/2024  Agree Persistent leak Back to suction  Betania Dizon O Esmae Donathan

## 2024-05-13 ENCOUNTER — Inpatient Hospital Stay (HOSPITAL_COMMUNITY): Payer: MEDICAID

## 2024-05-13 ENCOUNTER — Ambulatory Visit (HOSPITAL_COMMUNITY): Payer: MEDICAID

## 2024-05-13 LAB — SURGICAL PATHOLOGY

## 2024-05-13 MED ORDER — FUROSEMIDE 10 MG/ML IJ SOLN
20.0000 mg | Freq: Once | INTRAMUSCULAR | Status: AC
  Administered 2024-05-13: 20 mg via INTRAVENOUS
  Filled 2024-05-13: qty 2

## 2024-05-13 NOTE — Progress Notes (Signed)
 TCTS Called to see patient for shortness of breath and gradual oxygen desaturation over the course of the day. Oxygen requirement had increased from 2L [place] this morning to 15L high flow by 9pm and he is currently on 100% NRB. He continues to have chest discomfort associated with the pleural tube and said he had mid sternal pain when I asked him to cough. He said he does not feel short of breath now at rest with his O2 sat ~80-86 %.  A CXR was ordered when I was initially called showing no significant change in the known right apical space and some improvement in the vascular congestion seen earlier in the day.  The CT was placed on suction for about an hour with no change in his respiratory status or in the CXR so it was returned to water  seal.  On exam, he is alert and cooperative and does not appear distressed.  He does desaturate quickly when he removes the NRB to speak.   HR is 76     RR 18-22     BP 144/72  Skin: warm and dry  Heart: RRR, NSR in the 70-80's  Chest: resp effort slightly labored at first but settled to normal work of breathing within a few minutes. Good air movement with coarse breath sounds, rare wheeze. He has a strong, non-productive cough. There is a large, persistent air leak (apparently unchanged from earlier today) and thin serosanguinous fluid in the tube. All connections are secure.   I/P: Hypoxia 4 days post robotic right upper lobectomy may be related to exacerbation of his COPD or volume excess.  I do not think there is a mechanical issue with the chest tube or an enlarging PTX.Also doubt this is due to a PE as he has been on appropriate DVT PPX with SQ enoxaparin  since surgery.   Will repeat a dose of IV Lasix in 4 hours (he was given a dose at 10:30pm) and will change his HHN to Xopenex q6h scheduled for 48 hours. Checking an ABG to confirm accuracy of the pulse oximetry. Events discussed with Dr. Raye EMERSON Denis, PA-C

## 2024-05-13 NOTE — Progress Notes (Signed)
 Mobility Specialist Progress Note:   05/13/24 1050  Mobility  Activity Ambulated with assistance  Level of Assistance Contact guard assist, steadying assist  Assistive Device Front wheel walker  Distance Ambulated (ft) 300 ft  Activity Response Tolerated well  Mobility Referral Yes  Mobility visit 1 Mobility  Mobility Specialist Start Time (ACUTE ONLY) 1050  Mobility Specialist Stop Time (ACUTE ONLY) 1130  Mobility Specialist Time Calculation (min) (ACUTE ONLY) 40 min   Pt received at EOB, agreeable to mobility session. Ambulated in hallway with RW and MinG for safety. No LOB. Audible SOB upon arrival to room. Returned pt to room, SpO2 77% on 2L. Increased O2 flow, SpO2 90% on 4L. Took 20 min to recover. Sitting up in chair. RN in room. Left pt with all needs met  Erikah Thumm Mobility Specialist Please contact via SecureChat or  Rehab office at [phone]

## 2024-05-13 NOTE — Progress Notes (Incomplete)
 Called to see patient for shortness of breath and gradual oxygen desaturation over the course of the evening. Oxygen requirement had increased from 2L [place] this morning to 15L high flow by 9pm and he is currently on 100% NRB. He continues to have chest discomfort associated with the pleural tube and said he had mid sternal pain when I asked him to cough. He said he does not feel short of breath now at rest with his O2 sat ~80-86 %.  A CXR was ordered when I was initially called showing no significant change in the known right apical space and some improvement in the vascular congestion sen earlier in the day.  The CT was placed on suction for about an hour with no change in his respiratory status or in the CXR so it was returned to water  seal.  On exam, he is alert and cooperative and does not appear distressed.  He does desaturated quickly when he removes the NRB to speak.  HR is 76     RR 18-22     BP 144/72  Chest: resp effort slightly labored at first but settled to normal work of breathing within a few minutes. Good air movement with coarse breath sounds, rare wheeze. He has a strong, non-productive cough. There is a large, persistent air leak (apparently unchanged from earlier today) and

## 2024-05-13 NOTE — Plan of Care (Signed)
   Problem: Education: Goal: Knowledge of General Education information will improve Description: Including pain rating scale, medication(s)/side effects and non-pharmacologic comfort measures Outcome: Progressing   Problem: Health Behavior/Discharge Planning: Goal: Ability to manage health-related needs will improve Outcome: Progressing   Problem: Clinical Measurements: Goal: Ability to maintain clinical measurements within normal limits will improve Outcome: Progressing Goal: Will remain free from infection Outcome: Progressing   Problem: Nutrition: Goal: Adequate nutrition will be maintained Outcome: Progressing   Problem: Coping: Goal: Level of anxiety will decrease Outcome: Progressing

## 2024-05-13 NOTE — Plan of Care (Signed)
  Problem: Education: Goal: Knowledge of General Education information will improve Description: Including pain rating scale, medication(s)/side effects and non-pharmacologic comfort measures Outcome: Progressing   Problem: Health Behavior/Discharge Planning: Goal: Ability to manage health-related needs will improve Outcome: Progressing   Problem: Clinical Measurements: Goal: Ability to maintain clinical measurements within normal limits will improve Outcome: Progressing Goal: Respiratory complications will improve Outcome: Progressing   Problem: Activity: Goal: Risk for activity intolerance will decrease Outcome: Progressing   Problem: Education: Goal: Knowledge of disease or condition will improve Outcome: Progressing Goal: Knowledge of the prescribed therapeutic regimen will improve Outcome: Progressing   Problem: Activity: Goal: Risk for activity intolerance will decrease Outcome: Progressing   Problem: Cardiac: Goal: Will achieve and/or maintain hemodynamic stability Outcome: Progressing   Problem: Respiratory: Goal: Respiratory status will improve Outcome: Progressing   Problem: Pain Management: Goal: Pain level will decrease Outcome: Progressing

## 2024-05-13 NOTE — Progress Notes (Addendum)
      [address] [employer] [address]             [phone]         4 Days Post-Op Procedure(s) (LRB): WEDGE RESECTION, LUNG RIGHT UPPER LOBE, ROBOT-ASSISTED, THORACOSCOPIC (Right) LOBECTOMY, LUNG RIGHT UPPER LOBE, ROBOT-ASSISTED, USING VATS (Right) LYMPH NODE BIOPSY BLOCK, NERVE, INTERCOSTAL  Subjective:  Patient continues to have pain, now states his right side has a stinging sensation.  He is ambulating.  He has moved his bowels  Objective: Vital signs in last 24 hours: Temp:  [97.7 F (36.5 C)-98.7 F (37.1 C)] 98.1 F (36.7 C) (10/20 0304) Pulse Rate:  [57-82] 68 (10/20 0304) Cardiac Rhythm: Normal sinus rhythm (10/19 1953) Resp:  [10-19] 11 (10/20 0304) BP: (116-147)/(63-78) 133/74 (10/20 0304) SpO2:  [90 %-96 %] 94 % (10/20 0304)  Intake/Output from previous day: 10/19 0701 - 10/20 0700 In: 240 [P.O.:240] Out: 520 [Chest Tube:520]  General appearance: alert, cooperative, and no distress Heart: regular rate and rhythm Lungs: diminished breath sounds bibasilar Abdomen: soft, non-tender; bowel sounds normal; no masses,  no organomegaly Extremities: extremities normal, atraumatic, no cyanosis or edema Wound: clean and dry  Lab Results: Recent Labs    05/11/24 0347 05/12/24 0357  WBC 8.2 8.1  HGB 10.2* 10.8*  HCT 29.8* 31.6*  PLT 114* 127*   BMET:  Recent Labs    05/11/24 0347 05/12/24 0357  NA 134* 134*  K 3.6 4.0  CL 99 101  CO2 28 26  GLUCOSE 122* 109*  BUN 17 13  CREATININE 1.19 0.93  CALCIUM  7.8* 7.9*    PT/INR: No results for input(s): LABPROT, INR in the last 72 hours. ABG    Component Value Date/Time   PHART 7.355 08/23/2022 1326   HCO3 29.0 (H) 03/20/2024 1236   TCO2 28 05/30/2023 1229   ACIDBASEDEF 1.0 02/17/2022 0502   O2SAT 80.8 03/20/2024 1236   CBG (last 3)  No results for input(s): GLUCAP in the last 72 hours.  Assessment/Plan: S/P Procedure(s) (LRB): WEDGE RESECTION, LUNG RIGHT UPPER  LOBE, ROBOT-ASSISTED, THORACOSCOPIC (Right) LOBECTOMY, LUNG RIGHT UPPER LOBE, ROBOT-ASSISTED, USING VATS (Right) LYMPH NODE BIOPSY BLOCK, NERVE, INTERCOSTAL  CV- NSR, H/O HTN- continue home Imdur  30 mg daily Pulm- CT was placed back to suction yesterday, CXR with some improvement in apical space, large air leak persists, + right sub q emphysema Renal- last creatinine was normal at 0.9 Chronic Pain- doing well on current regimen Continue Lovenox  for DVT prophylaxis Dispo- patient stable, chest tube on suction this morning with large air leak.. some improvement in right sided pneumothorax.. discussed with Dr. Kerrin will place chest tube back on water  seal   LOS: 4 days    Rocky Shad, PA-C 05/13/2024 7:22 AM  Patient seen and examined, agree with above He has an apical space that changed minimally with suction- keep on water  seal. Pain well controlled  Elspeth C. Kerrin, MD [facility] [phone]

## 2024-05-14 ENCOUNTER — Inpatient Hospital Stay (HOSPITAL_COMMUNITY): Payer: MEDICAID

## 2024-05-14 DIAGNOSIS — R0689 Other abnormalities of breathing: Secondary | ICD-10-CM

## 2024-05-14 DIAGNOSIS — J9611 Chronic respiratory failure with hypoxia: Secondary | ICD-10-CM | POA: Diagnosis not present

## 2024-05-14 DIAGNOSIS — F209 Schizophrenia, unspecified: Secondary | ICD-10-CM | POA: Diagnosis not present

## 2024-05-14 DIAGNOSIS — G8912 Acute post-thoracotomy pain: Secondary | ICD-10-CM

## 2024-05-14 DIAGNOSIS — J449 Chronic obstructive pulmonary disease, unspecified: Secondary | ICD-10-CM

## 2024-05-14 DIAGNOSIS — J9601 Acute respiratory failure with hypoxia: Secondary | ICD-10-CM

## 2024-05-14 DIAGNOSIS — R0902 Hypoxemia: Secondary | ICD-10-CM

## 2024-05-14 DIAGNOSIS — R918 Other nonspecific abnormal finding of lung field: Secondary | ICD-10-CM

## 2024-05-14 DIAGNOSIS — Z902 Acquired absence of lung [part of]: Secondary | ICD-10-CM | POA: Diagnosis not present

## 2024-05-14 DIAGNOSIS — C3411 Malignant neoplasm of upper lobe, right bronchus or lung: Secondary | ICD-10-CM | POA: Diagnosis not present

## 2024-05-14 LAB — BASIC METABOLIC PANEL WITH GFR
Anion gap: 10 (ref 5–15)
BUN: 17 mg/dL (ref 8–23)
CO2: 24 mmol/L (ref 22–32)
Calcium: 8.1 mg/dL — ABNORMAL LOW (ref 8.9–10.3)
Chloride: 96 mmol/L — ABNORMAL LOW (ref 98–111)
Creatinine, Ser: 1.03 mg/dL (ref 0.61–1.24)
GFR, Estimated: >60 mL/min (ref >60)
Glucose, Bld: 127 mg/dL — ABNORMAL HIGH (ref 70–99)
Potassium: 4 mmol/L (ref 3.5–5.1)
Sodium: 130 mmol/L — ABNORMAL LOW (ref 135–145)

## 2024-05-14 LAB — BLOOD GAS, ARTERIAL
Acid-base deficit: 1.2 mmol/L (ref 0.0–2.0)
Bicarbonate: 23.6 mmol/L (ref 20.0–28.0)
O2 Saturation: 85.8 %
Patient temperature: 37
pCO2 arterial: 39 mmHg (ref 32–48)
pH, Arterial: 7.39 (ref 7.35–7.45)
pO2, Arterial: 54 mmHg — ABNORMAL LOW (ref 83–108)

## 2024-05-14 LAB — MRSA NEXT GEN BY PCR, NASAL: MRSA by PCR Next Gen: NOT DETECTED

## 2024-05-14 LAB — ECHOCARDIOGRAM LIMITED
Calc EF: 68.7 %
Height: 75 in
S' Lateral: 3.2 cm
Single Plane A2C EF: 69.6 %
Single Plane A4C EF: 67.7 %
Weight: 4116.43 [oz_av]

## 2024-05-14 LAB — CBC
HCT: 30.5 % — ABNORMAL LOW (ref 39.0–52.0)
Hemoglobin: 10.8 g/dL — ABNORMAL LOW (ref 13.0–17.0)
MCH: 33.3 pg (ref 26.0–34.0)
MCHC: 35.4 g/dL (ref 30.0–36.0)
MCV: 94.1 fL (ref 80.0–100.0)
Platelets: 161 K/uL (ref 150–400)
RBC: 3.24 MIL/uL — ABNORMAL LOW (ref 4.22–5.81)
RDW: 13.4 % (ref 11.5–15.5)
WBC: 9.1 K/uL (ref 4.0–10.5)
nRBC: 0 % (ref 0.0–0.2)

## 2024-05-14 LAB — MAGNESIUM: Magnesium: 1.8 mg/dL (ref 1.7–2.4)

## 2024-05-14 LAB — TROPONIN I (HIGH SENSITIVITY)
Troponin I (High Sensitivity): 58 ng/L — ABNORMAL HIGH (ref <18)
Troponin I (High Sensitivity): 47 ng/L — ABNORMAL HIGH (ref <18)

## 2024-05-14 LAB — GLUCOSE, CAPILLARY
Glucose-Capillary: 124 mg/dL — ABNORMAL HIGH (ref 70–99)
Glucose-Capillary: 167 mg/dL — ABNORMAL HIGH (ref 70–99)
Glucose-Capillary: 130 mg/dL — ABNORMAL HIGH (ref 70–99)

## 2024-05-14 LAB — PROCALCITONIN: Procalcitonin: 0.41 ng/mL

## 2024-05-14 MED ORDER — NICOTINE 21 MG/24HR TD PT24
21.0000 mg | MEDICATED_PATCH | Freq: Every day | TRANSDERMAL | Status: DC
  Administered 2024-05-14 – 2024-05-21 (×8): 21 mg via TRANSDERMAL
  Filled 2024-05-14 (×8): qty 1

## 2024-05-14 MED ORDER — LEVALBUTEROL HCL 0.63 MG/3ML IN NEBU
0.6300 mg | INHALATION_SOLUTION | Freq: Four times a day (QID) | RESPIRATORY_TRACT | Status: DC
  Administered 2024-05-14: 0.63 mg via RESPIRATORY_TRACT
  Filled 2024-05-14: qty 3

## 2024-05-14 MED ORDER — KETOROLAC TROMETHAMINE 15 MG/ML IJ SOLN
15.0000 mg | Freq: Four times a day (QID) | INTRAMUSCULAR | Status: AC | PRN
  Administered 2024-05-14 – 2024-05-17 (×5): 15 mg via INTRAVENOUS
  Filled 2024-05-14 (×5): qty 1

## 2024-05-14 MED ORDER — ALPRAZOLAM 0.25 MG PO TABS
0.2500 mg | ORAL_TABLET | Freq: Three times a day (TID) | ORAL | Status: DC | PRN
  Administered 2024-05-14 – 2024-05-15 (×4): 0.25 mg via ORAL
  Filled 2024-05-14 (×4): qty 1

## 2024-05-14 MED ORDER — FUROSEMIDE 10 MG/ML IJ SOLN
40.0000 mg | Freq: Once | INTRAMUSCULAR | Status: AC
  Administered 2024-05-14: 40 mg via INTRAVENOUS
  Filled 2024-05-14: qty 4

## 2024-05-14 MED ORDER — METHYLPREDNISOLONE SODIUM SUCC 40 MG IJ SOLR: 40.0000 mg | Freq: Three times a day (TID) | INTRAMUSCULAR | Status: DC

## 2024-05-14 MED ORDER — METHYLPREDNISOLONE SODIUM SUCC 125 MG IJ SOLR
125.0000 mg | Freq: Once | INTRAMUSCULAR | Status: AC
  Administered 2024-05-14: 125 mg via INTRAVENOUS
  Filled 2024-05-14: qty 2

## 2024-05-14 MED ORDER — POTASSIUM CHLORIDE 20 MEQ PO PACK
20.0000 meq | PACK | Freq: Once | ORAL | Status: AC
  Administered 2024-05-14: 20 meq via ORAL
  Filled 2024-05-14: qty 1

## 2024-05-14 MED ORDER — LIDOCAINE 5 % EX PTCH
1.0000 | MEDICATED_PATCH | Freq: Every day | CUTANEOUS | Status: DC
  Administered 2024-05-14 – 2024-05-21 (×8): 1 via TRANSDERMAL
  Filled 2024-05-14 (×8): qty 1

## 2024-05-14 MED ORDER — FUROSEMIDE 10 MG/ML IJ SOLN
20.0000 mg | Freq: Once | INTRAMUSCULAR | Status: AC
  Administered 2024-05-14: 20 mg via INTRAVENOUS
  Filled 2024-05-14: qty 2

## 2024-05-14 MED ORDER — MAGNESIUM SULFATE 2 GM/50ML IV SOLN
2.0000 g | Freq: Once | INTRAVENOUS | Status: AC
  Administered 2024-05-14: 2 g via INTRAVENOUS
  Filled 2024-05-14: qty 50

## 2024-05-14 MED ORDER — IPRATROPIUM-ALBUTEROL 0.5-2.5 (3) MG/3ML IN SOLN
3.0000 mL | RESPIRATORY_TRACT | Status: DC
  Administered 2024-05-14 – 2024-05-24 (×60): 3 mL via RESPIRATORY_TRACT
  Filled 2024-05-14 (×59): qty 3

## 2024-05-14 MED ORDER — LACTULOSE 10 GM/15ML PO SOLN
10.0000 g | ORAL | Status: AC
  Administered 2024-05-14 – 2024-05-15 (×5): 10 g via ORAL
  Filled 2024-05-14 (×5): qty 15

## 2024-05-14 MED ORDER — METHYLPREDNISOLONE SODIUM SUCC 125 MG IJ SOLR
80.0000 mg | Freq: Every day | INTRAMUSCULAR | Status: DC
  Administered 2024-05-14: 125 mg via INTRAVENOUS
  Filled 2024-05-14: qty 2

## 2024-05-14 NOTE — Progress Notes (Signed)
 63 year old male who underwent a right upper lobectomy, overnight went into respiratory distress, given diuretics and started on steroids and nebs, requiring heated high flow nasal cannula oxygen, transferred to ICU  Currently on 55 L 100% FiO2 on heated high flow, sitting upright, stated feeling better  X-ray chest is suggestive of bilateral infiltrates likely due to pulmonary edema He is afebrile and stable white count so less likely aspiration event  On exam he has bilateral crackles right more than left Heart regular rate and rhythm Abdomen soft, nontender, nondistended  Assessment and plan Acute on chronic hypoxic respiratory failure Probable acute COPD exacerbation Acute pulmonary edema Adenocarcinoma of lung status post right upper lobectomy Persistent right pneumothorax status post chest tube placement Schizophrenia Hypervolemic hyponatremia  Continue heated high flow nasal cannula oxygen, titrate with O2 sat goal 90%, will avoid higher oxygenation considering he has COPD, he will start retaining with higher oxygen Continue nebs and steroid to complete 5-day course Patient may need more diuretics later today Monitor intake and output Closely monitor chest tube output Monitor electrolytes   My critical care time spent 25 minutes  Valinda Novas, MD

## 2024-05-14 NOTE — Significant Event (Signed)
 Rapid Response Event Note   Reason for Call : Hypoxic respiratory failure Initial Focused Assessment:  I was notified about Keith Gibbs being in respiratory distress following an episode of urination at the side of the bed. I saw him earlier when Laurel Becket PA was at the bedside (see note). Upon arrival, he was labored breathing with accessory muscle use with oxygen saturations in the 70s while on NRB mask at 15L. Pt was repositioned in Fowlers position in the bed and after some coaching his sats came up into the 90s.  ABG from 0034- 7.39/39/54/23.6 on 15L NRB mask.  Alert, oriented c/o SOB, pain to R chest BBS coarse with nonprod cough Skin warm, pink and diaphoretic Abd lg soft nt Right CT to H2O seal with known air leak  0115-98.87F, HR 89 SR, 145/83 (99), RR 22 with sats 84% on NRB mask. Sats improved to 94% after resting  Interventions:  -Tx to 2H04    MD Notified: EMERSON Becket PA Call Time: 0109 Arrival Time: 0115 End Time: 0215  Griselda Alm ORN, RN

## 2024-05-14 NOTE — Progress Notes (Signed)
 5 Days Post-Op Procedure(s) (LRB): WEDGE RESECTION, LUNG RIGHT UPPER LOBE, ROBOT-ASSISTED, THORACOSCOPIC (Right) LOBECTOMY, LUNG RIGHT UPPER LOBE, ROBOT-ASSISTED, USING VATS (Right) LYMPH NODE BIOPSY BLOCK, NERVE, INTERCOSTAL Subjective: Events of last night noted and d/w Keith Gibbs Decompensation with acute on chronic respiratory insufficiency necessitating ICU transfer and high flow Keith Gibbs Currently sitting up in bed with increased WOB, but denies respiratory distress Pain OK 6/10  Objective: Vital signs in last 24 hours: Temp:  [97.8 F (36.6 C)-98.6 F (37 C)] 98 F (36.7 C) (10/21 0215) Pulse Rate:  [67-89] 80 (10/21 0700) Cardiac Rhythm: Normal sinus rhythm (10/21 0215) Resp:  [9-24] 14 (10/21 0700) BP: (95-156)/(61-83) 156/77 (10/21 0700) SpO2:  [72 %-98 %] 97 % (10/21 0700) FiO2 (%):  [100 %] 100 % (10/21 0333) Weight:  [116.7 kg] 116.7 kg (10/21 0215)  Hemodynamic parameters for last 24 hours:    Intake/Output from previous day: 10/20 0701 - 10/21 0700 In: 960 [P.O.:960] Out: 2280 [Urine:2050; Chest Tube:230] Intake/Output this shift: No intake/output data recorded.  General appearance: alert, cooperative, and mild distress Neurologic: intact Heart: regular rate and rhythm Lungs: rales bilaterally and wheezes bilaterally Abdomen: normal findings: soft, non-tender Wound: serous drainage, dressed  Lab Results: Recent Labs    05/12/24 0357 05/14/24 0550  WBC 8.1 9.1  HGB 10.8* 10.8*  HCT 31.6* 30.5*  PLT 127* 161   BMET:  Recent Labs    05/12/24 0357 05/14/24 0550  NA 134* 130*  K 4.0 4.0  CL 101 96*  CO2 26 24  GLUCOSE 109* 127*  BUN 13 17  CREATININE 0.93 1.03  CALCIUM  7.9* 8.1*    PT/INR: No results for input(s): LABPROT, INR in the last 72 hours. ABG    Component Value Date/Time   PHART 7.39 05/14/2024 0034   HCO3 23.6 05/14/2024 0034   TCO2 28 05/30/2023 1229   ACIDBASEDEF 1.2 05/14/2024 0034   O2SAT 85.8 05/14/2024 0034    CBG (last 3)  No results for input(s): GLUCAP in the last 72 hours.  Assessment/Plan: S/P Procedure(s) (LRB): WEDGE RESECTION, LUNG RIGHT UPPER LOBE, ROBOT-ASSISTED, THORACOSCOPIC (Right) LOBECTOMY, LUNG RIGHT UPPER LOBE, ROBOT-ASSISTED, USING VATS (Right) LYMPH NODE BIOPSY BLOCK, NERVE, INTERCOSTAL POD # 5 NEURO- intact CV- in Sr, no hemodynamic compromise RESP- acute on chronic respiratory insufficiency   Requiring 55L HFNC-   7.39/39/54/+1  CXR with worsening opacities bilaterally  Stable right apical space  Ct with air leak unchanged Picture c/w pulmonary edema vs ARDS  Respiratory status improved with Lasix and steroids but still high Aa gradient No fever and WBC normal so doubt pneumonia or aspiration PE unlikely with anticoagulation and opacities explain hypoxia CCM consulted  RENAL- creatinine normal, mild hyponatremia  Diurese ENDO- no issues GI- has been tolerating diet SCD + enoxaparin  for DVT prophylaxis  LOS: 5 days    Keith Gibbs 05/14/2024

## 2024-05-14 NOTE — Consult Note (Signed)
 NAME:  Keith Gibbs, MRN:  [id number], DOB:  1960/08/11, LOS: 5 ADMISSION DATE:  05/09/2024, CONSULTATION DATE:  05/14/24 REFERRING MD:  CTS, CHIEF COMPLAINT:  acute on chronic hypoxic resp failure   History of Present Illness:  63 yo male presented 4 days ago for lobectomy with CTS. Progressing as expected post operatively with CT in place and was on 4E when this morning he decompensated with worsening hypoxemia and increased wob. CTS transferred to ICU and requested CCM evaluation.   At time of my exam pt appeared relatively comfortable, anxious but able to speak in full sentences without issue. Sats 98% on HHFNC 55L and 100%. D/w him any further change would require intubation. However at this time I do feel that he is able to remain on HHFNC. He states he wants to see if this stabilizes with the [facility] for now.   Pt states that his breathing is improved from his initial decompensation this morning, but not back to baseline. He denies, cough, hemoptysis, n/v/d. Respirations are worse with movement, he doesn't not endorse dyspnea with speech. Denies pain at this time.   He is diffusely wheezing, was just dosed with lasix prior to transfer. Will start duo nebs scheduled, give prednisone  bolus dosing and then schedule for short duration as understand need for healing s/p lobectomy.   Pertinent  Medical History  RUL nodule s/p RUL lobectomy Bipolar 1 d/o Schizophrenia Polysubstance abuse Cad Mild to mod Ao insuff Htn Hyperlipidemia Tobacco use COPD Hep C   Significant Hospital Events: Including procedures, antibiotic start and stop dates in addition to other pertinent events   Admitted 10/16 for RUL Lobectomy Transferred to ICU 10/21   Interim History / Subjective:    Objective    Blood pressure (!) 145/83, pulse 89, temperature 98.4 F (36.9 C), temperature source Oral, resp. rate (!) 24, height 6' 3 (1.905 m), weight 115.4 kg, SpO2 (!) 84%.    FiO2 (%):  [100 %] 100 %    Intake/Output Summary (Last 24 hours) at 05/14/2024 0239 Last data filed at 05/14/2024 0118 Gross per 24 hour  Intake 480 ml  Output 1810 ml  Net -1330 ml   Filed Weights   05/11/24 0610 05/12/24 0359  Weight: 118.7 kg 115.4 kg    Examination: General: elderly appearing male reclining comfortably in bed but does have periods of anxiety noted. No accessory muscle use HHFNC in place HENT: ncat, eomi, perrla, mmmp Lungs: diffuse wheezing with exception of absent RUL breath sounds Cardiovascular: rrr no m/g/r Abdomen: soft nt/nd bs+ Extremities: no c/c/e Neuro: no focal deficits, mild baseline tremor GU: deferred  Resolved problem list   Assessment and Plan   Acute hypoxic resp failure post operatively Rul lobectomy with chest tube in place Adenocarcinoma Bipolar d/o Schizophrenia H/o cad Copd, +/- acute exacerbation Post lobectomy ptx with chest tube -titrate oxygen for sat >99-92% -add steroids, short burst -duonebs -close monitoring for need for intubation -home meds -decreased mobility for the time being -monitor urine output with external catheter -cont lasix per CTS -chest tube management per CTS -chest xray stable -further imaging per CTS -ccm will cont to follow along     Labs   CBC: Recent Labs  Lab 05/07/24 1400 05/10/24 0500 05/11/24 0347 05/12/24 0357  WBC 6.7 11.0* 8.2 8.1  HGB 13.8 11.7* 10.2* 10.8*  HCT 40.3 33.3* 29.8* 31.6*  MCV 96.2 94.9 97.1 97.2  PLT 156 131* 114* 127*    Basic Metabolic Panel: Recent Labs  Lab 05/07/24 1400 05/10/24 0500 05/11/24 0347 05/12/24 0357  NA 132* 133* 134* 134*  K 4.4 4.0 3.6 4.0  CL 97* 100 99 101  CO2 27 24 28 26   GLUCOSE 86 127* 122* 109*  BUN 15 15 17 13   CREATININE 1.04 1.09 1.19 0.93  CALCIUM  9.1 8.2* 7.8* 7.9*   GFR: Estimated Creatinine Clearance: 111.4 mL/min (by C-G formula based on SCr of 0.93 mg/dL). Recent Labs  Lab 05/07/24 1400 05/10/24 0500 05/11/24 0347  05/12/24 0357  WBC 6.7 11.0* 8.2 8.1    Liver Function Tests: Recent Labs  Lab 05/07/24 1400 05/11/24 0347  AST 17 24  ALT 14 11  ALKPHOS 47 35*  BILITOT 0.6 0.5  PROT 6.6 5.3*  ALBUMIN  3.7 2.9*   No results for input(s): LIPASE, AMYLASE in the last 168 hours. No results for input(s): AMMONIA in the last 168 hours.  ABG    Component Value Date/Time   PHART 7.39 05/14/2024 0034   PCO2ART 39 05/14/2024 0034   PO2ART 54 (L) 05/14/2024 0034   HCO3 23.6 05/14/2024 0034   TCO2 28 05/30/2023 1229   ACIDBASEDEF 1.2 05/14/2024 0034   O2SAT 85.8 05/14/2024 0034     Coagulation Profile: Recent Labs  Lab 05/07/24 1400  INR 1.0    Cardiac Enzymes: No results for input(s): CKTOTAL, CKMB, CKMBINDEX, TROPONINI in the last 168 hours.  HbA1C: Hgb A1c MFr Bld  Date/Time Value Ref Range Status  02/12/2024 08:40 AM 5.3 4.8 - 5.6 % Final    Comment:             Prediabetes: 5.7 - 6.4          Diabetes: >6.4          Glycemic control for adults with diabetes: <7.0   04/26/2022 03:30 PM 5.2 4.8 - 5.6 % Final    Comment:    (NOTE) Pre diabetes:          5.7%-6.4%  Diabetes:              >6.4%  Glycemic control for   <7.0% adults with diabetes     CBG: No results for input(s): GLUCAP in the last 168 hours.  Review of Systems:   Per HPI  Past Medical History:  He,  has a past medical history of Anginal pain, Anxiety, Arthritis, Bipolar 1 disorder (HCC), Bursitis, CAD (coronary artery disease), Cancer (HCC), Chronic pain, COPD (chronic obstructive pulmonary disease) (HCC), Current use of long term anticoagulation, Depression, Diverticulitis, Dyspnea, GERD (gastroesophageal reflux disease), Grade I diastolic dysfunction, Hepatitis C (2012), HLD (hyperlipidemia), Hypertension, MI (myocardial infarction) (HCC), Polysubstance abuse (HCC), PUD (peptic ulcer disease), S/P angioplasty with stent (06/10/2016), S/P PTCA (percutaneous transluminal coronary angioplasty)  (12/04/2019), Schizophrenia (HCC), Stroke (HCC), Tremors, and Valvular insufficiency.   Surgical History:   Past Surgical History:  Procedure Laterality Date   ABDOMINAL SURGERY     removed small piece of intestines due to [facility] Diverticulosis   ANTERIOR LAT LUMBAR FUSION N/A 08/23/2022   Procedure: DIRECT LATERAL INTERBODY FUSION  LUMBAR TWO- LUMBAR THREE, LUMBAR THREE-LUMBAR FOUR , EXPLORE AND EXTEND FUSION LUMBAR TWO-LUMBAR FIVE, POSTERIOR DECOMPRESSION LUMBAR THREE-LUMBAR FOUR, LEFT LUMBAR TWO-LUMBAR THREE;  Surgeon: Debby Dorn MATSU, MD;  Location: [facility] OR;  Service: Neurosurgery;  Laterality: N/A;   APPENDECTOMY     BACK SURGERY     CARDIAC CATHETERIZATION Left 06/10/2016   Procedure: Left Heart Cath and Coronary Angiography;  Surgeon: Denyse DELENA Bathe, MD;  Location: [facility] INVASIVE  CV LAB;  Service: Cardiovascular;  Laterality: Left;   CARDIAC CATHETERIZATION N/A 06/10/2016   Procedure: Coronary Stent Intervention;  Surgeon: Cara JONETTA Lovelace, MD;  Location: [facility] INVASIVE CV LAB;  Service: Cardiovascular;  Laterality: N/A;   CHOLECYSTECTOMY N/A 02/17/2022   Procedure: LAPAROSCOPIC CHOLECYSTECTOMY;  Surgeon: Stevie Herlene Righter, MD;  Location: [facility] OR;  Service: General;  Laterality: N/A;   COLON SURGERY     COLONOSCOPY     COLONOSCOPY WITH PROPOFOL  N/A 01/05/2017   Procedure: COLONOSCOPY WITH PROPOFOL ;  Surgeon: Therisa Bi, MD;  Location: [facility] ENDOSCOPY;  Service: Endoscopy;  Laterality: N/A;   COLONOSCOPY WITH PROPOFOL  N/A 02/13/2020   Procedure: COLONOSCOPY WITH PROPOFOL ;  Surgeon: Janalyn Keene NOVAK, MD;  Location: [facility] ENDOSCOPY;  Service: Endoscopy;  Laterality: N/A;   CORONARY ANGIOPLASTY WITH STENT PLACEMENT     CORONARY PRESSURE/FFR STUDY N/A 12/04/2019   Procedure: INTRAVASCULAR PRESSURE WIRE/FFR STUDY;  Surgeon: Wonda Sharper, MD;  Location: [facility] INVASIVE CV LAB;  Service: Cardiovascular;  Laterality: N/A;   ESOPHAGOGASTRODUODENOSCOPY (EGD) WITH PROPOFOL  N/A 01/05/2017    Procedure: ESOPHAGOGASTRODUODENOSCOPY (EGD) WITH PROPOFOL ;  Surgeon: Therisa Bi, MD;  Location: [facility] ENDOSCOPY;  Service: Endoscopy;  Laterality: N/A;   ESOPHAGOGASTRODUODENOSCOPY (EGD) WITH PROPOFOL  N/A 02/13/2020   Procedure: ESOPHAGOGASTRODUODENOSCOPY (EGD) WITH PROPOFOL ;  Surgeon: Janalyn Keene NOVAK, MD;  Location: [facility] ENDOSCOPY;  Service: Endoscopy;  Laterality: N/A;   INTERCOSTAL NERVE BLOCK  05/09/2024   Procedure: BLOCK, NERVE, INTERCOSTAL;  Surgeon: Kerrin Elspeth BROCKS, MD;  Location: [facility] OR;  Service: Thoracic;;   KNEE ARTHROSCOPY WITH MEDIAL MENISECTOMY Right 09/05/2017   Procedure: KNEE ARTHROSCOPY WITH MEDIAL AND LATERAL  MENISECTOMY PARTIAL SYNOVECTOMY;  Surgeon: Kathlynn Sharper, MD;  Location: [facility] ORS;  Service: Orthopedics;  Laterality: Right;   LEFT HEART CATH AND CORONARY ANGIOGRAPHY N/A 12/04/2019   Procedure: LEFT HEART CATH AND CORONARY ANGIOGRAPHY;  Surgeon: Wonda Sharper, MD;  Location: [facility] INVASIVE CV LAB;  Service: Cardiovascular;  Laterality: N/A;   LEFT HEART CATH AND CORONARY ANGIOGRAPHY Left 11/25/2022   Procedure: LEFT HEART CATH AND CORONARY ANGIOGRAPHY;  Surgeon: Fernand Denyse LABOR, MD;  Location: [facility] INVASIVE CV LAB;  Service: Cardiovascular;  Laterality: Left;   LOBECTOMY, LUNG, ROBOT-ASSISTED, USING VATS Right 05/09/2024   Procedure: LOBECTOMY, LUNG RIGHT UPPER LOBE, ROBOT-ASSISTED, USING VATS;  Surgeon: Kerrin Elspeth BROCKS, MD;  Location: [facility] OR;  Service: Thoracic;  Laterality: Right;   LYMPH NODE BIOPSY  05/09/2024   Procedure: LYMPH NODE BIOPSY;  Surgeon: Kerrin Elspeth BROCKS, MD;  Location: [facility] OR;  Service: Thoracic;;   REVERSE SHOULDER ARTHROPLASTY Right 08/31/2023   Procedure: RIGHT REVERSE SHOULDER ARTHROPLASTY;  Surgeon: [name], MD;  Location: [facility] OR;  Service: Orthopedics;  Laterality: Right;   SHOULDER SURGERY Right 04/09/2012   SPINE SURGERY     TOTAL KNEE ARTHROPLASTY Right 01/19/2021   Procedure: TOTAL KNEE ARTHROPLASTY - [place] Amber  to Assist;  Surgeon: Kathlynn Sharper, MD;  Location: [facility] ORS;  Service: Orthopedics;  Laterality: Right;   WEDGE RESECTION, LUNG, ROBOT-ASSISTED, THORACOSCOPIC Right 05/09/2024   Procedure: WEDGE RESECTION, LUNG RIGHT UPPER LOBE, ROBOT-ASSISTED, THORACOSCOPIC;  Surgeon: Kerrin Elspeth BROCKS, MD;  Location: [facility] OR;  Service: Thoracic;  Laterality: Right;     Social History:   reports that he quit smoking about 25 years ago. His smoking use included cigarettes. He started smoking about 45 years ago. He has a 63.8 pack-year smoking history. He has been exposed to tobacco smoke. He has never used smokeless tobacco. He reports that he does not currently  use alcohol  after a past usage of about 3.0 standard drinks of alcohol  per week. He reports that he does not currently use drugs after having used the following drugs: Cocaine and Marijuana.   Family History:  His family history includes Depression in his mother; Early death in his father; Heart attack in his father; Heart disease in his father and mother; Hypertension in his father, mother, and sister; Osteoarthritis in his mother; Parkinson's disease in his maternal grandfather. There is no history of Prostate cancer, Bladder Cancer, Kidney cancer, or Tremor.   Allergies Allergies  Allergen Reactions   Asenapine Other (See Comments) and Nausea And Vomiting    Increased tremors   Dextromethorphan Hbr    Guaifenesin     Latuda [Lurasidone Hcl] Other (See Comments)    Tremors     Lurasidone Other (See Comments) and Hives    Other Reaction(s): Angioedema   Phenylephrine       Home Medications  Prior to Admission medications   Medication Sig Start Date End Date Taking? Authorizing Provider  acetaminophen  (TYLENOL ) 325 MG tablet Take 1 tablet (325 mg total) by mouth every 6 (six) hours as needed for mild pain (pain score 1-3) (or temp > 100.5). 09/01/23  Yes [name], MD  albuterol  (VENTOLIN  HFA) 108 [phone] Base) MCG/ACT inhaler Inhale 2  puffs into the lungs every 6 (six) hours as needed for wheezing or shortness of breath. 12/19/23  Yes Lorren Greig PARAS, NP  aspirin  EC 81 MG tablet Take 1 tablet (81 mg total) by mouth daily. Swallow whole. 02/03/23  Yes Evelena, Nadir, MD  atorvastatin  (LIPITOR ) 80 MG tablet TAKE 1 TABLET BY MOUTH EVERY DAY 01/15/24  Yes [name], MD  budesonide -glycopyrrolate -formoterol  (BREZTRI  AEROSPHERE) 160-9-4.8 MCG/ACT AERO inhaler Inhale 2 puffs into the lungs in the morning and at bedtime. 12/19/23  Yes Lorren, Amy J, NP  celecoxib  (CELEBREX ) 100 MG capsule TAKE 2 CAPSULES BY MOUTH TWICE A DAY 04/25/24  Yes Georgina Ozell LABOR, MD  divalproex  (DEPAKOTE ) 500 MG DR tablet Take half tablet in the morning and 3 tablets at bedtime Patient taking differently: Take half tablet in the morning, 1 in the afternoon, and 2 tablets at bedtime 04/25/24  Yes Izella Ismael NOVAK, MD  DULoxetine  (CYMBALTA ) 30 MG capsule Take 3 capsules (90 mg total) by mouth daily. Patient taking differently: Take 30 mg by mouth 3 (three) times daily. 04/25/24 06/24/24 Yes Izella Ismael NOVAK, MD  gabapentin  (NEURONTIN ) 600 MG tablet Take 1 tablet (600 mg total) by mouth daily AND 2 tablets (1,200 mg total) at bedtime. 04/25/24  Yes Izella Ismael NOVAK, MD  hydrOXYzine  (ATARAX ) 25 MG tablet Take 1 tablet (25 mg total) by mouth 2 (two) times daily as needed. Patient taking differently: Take 25 mg by mouth daily. 04/25/24  Yes Izella Ismael NOVAK, MD  isosorbide  mononitrate (IMDUR ) 30 MG 24 hr tablet TAKE 1 TABLET BY MOUTH 2 TIMES DAILY. 04/09/24  Yes Fernand Denyse LABOR, MD  Multiple Vitamins-Minerals (MENS 50+ MULTIVITAMIN) TABS Take 1 tablet by mouth in the morning.   Yes [provider]  nicotine  polacrilex (NICOTINE  MINI) 2 MG lozenge Take 1 lozenge (2 mg total) by mouth every 2 (two) hours as needed for smoking cessation. 03/26/24 06/24/24 Yes Dgayli, Belva, MD  nitroGLYCERIN  (NITROSTAT ) 0.4 MG SL tablet Place 1 tablet (0.4 mg total) under the tongue  every 5 (five) minutes as needed for chest pain. 02/03/23 05/06/24 Yes Attiah, Nadir, MD  pantoprazole  (PROTONIX ) 40 MG tablet Take 1  tablet (40 mg total) by mouth daily. 12/19/23  Yes Lorren, Amy J, NP  QUEtiapine  (SEROQUEL ) 200 MG tablet Take 1 tablet (200 mg total) by mouth at bedtime. 04/25/24  Yes Izella Ismael NOVAK, MD  traZODone  (DESYREL ) 100 MG tablet Take 0.5 tablets (50 mg total) by mouth at bedtime for 7 days. Patient not taking: Reported on 05/06/2024 04/25/24 05/02/24  Izella Ismael NOVAK, MD     Critical care time: 

## 2024-05-14 NOTE — Progress Notes (Signed)
 eLink Physician-Brief Progress Note Patient Name: MAKENZIE VITTORIO DOB: 1961-01-16 MRN: [id number]   Date of Service  05/14/2024  HPI/Events of Note  BSRN asked if we would like to add BMP Mg to labs  eICU Interventions  Reviewed previous labs and abnormalities noted Reasonable to repeat AM electrolytes     Intervention Category Intermediate Interventions: Electrolyte abnormality - evaluation and management  Damien ONEIDA Grout 05/14/2024, 4:23 AM

## 2024-05-14 NOTE — Progress Notes (Signed)
  Echocardiogram 2D Echocardiogram has been performed.  Keith Gibbs 05/14/2024, 11:25 AM

## 2024-05-14 NOTE — Progress Notes (Signed)
   [address], [place] [address]             [phone]   Resting in bed Very fidgety  BP (!) 141/76 (BP Location: Left Arm)   Pulse 78   Temp 98.4 F (36.9 C) (Oral)   Resp 16   Ht 6' 3 (1.905 m)   Wt 116.7 kg   SpO2 96%   BMI 32.16 kg/m    Intake/Output Summary (Last 24 hours) at 05/14/2024 1837 Last data filed at 05/14/2024 1826 Gross per 24 hour  Intake 905.25 ml  Output 3370 ml  Net -2464.75 ml   Received 40 mg Lasix around 4 PM  Still high Aa gradient but O2 down to 40 L  Zeno Hickel C. Kerrin, MD [facility] [phone]

## 2024-05-14 NOTE — TOC Initial Note (Signed)
 Transition of Care [facility]) - Initial/Assessment Note    Patient Details  Name: Keith Gibbs MRN: [id number] Date of Birth: February 27, 1961  Transition of Care [facility]) CM/SW Contact:    Justina Delcia Czar, RN Phone Number: [phone] 05/14/2024, 2:11 PM  Clinical Narrative:                 Spoke to pt and states he lives at home with mother. Gave permission to speak to mother for additional information.  Mother takes pt to his MD appts. Pt does not drive.  Pt does not have DME or hx of HH in the past.   Will continue to follow for dc needs.   Expected Discharge Plan: Home w Home Health Services Barriers to Discharge: Continued Medical Work up   Patient Goals and CMS Choice Patient states their goals for this hospitalization and ongoing recovery are:: wants to recover       Expected Discharge Plan and Services   Discharge Planning Services: CM Consult   Living arrangements for the past 2 months: Single Family Home                                      Prior Living Arrangements/Services Living arrangements for the past 2 months: Single Family Home Lives with:: Parents Patient language and need for interpreter reviewed:: Yes Do you feel safe going back to the place where you live?: Yes      Need for Family Participation in Patient Care: No (Comment) Care giver support system in place?: Yes (comment)   Criminal Activity/Legal Involvement Pertinent to Current Situation/Hospitalization: No - Comment as needed  Activities of Daily Living   ADL Screening (condition at time of admission) Independently performs ADLs?: Yes (appropriate for developmental age) Is the patient deaf or have difficulty hearing?: Yes Does the patient have difficulty seeing, even when wearing glasses/contacts?: No Does the patient have difficulty concentrating, remembering, or making decisions?: No  Permission Sought/Granted Permission sought to share information with : Case Manager, Family  Supports, PCP Permission granted to share information with : Yes, Verbal Permission Granted  Share Information with NAME: Keith Gibbs  Permission granted to share info w AGENCY: PCP, DME, Home Health  Permission granted to share info w Relationship: mother  Permission granted to share info w Contact Information: [phone]  Emotional Assessment Appearance:: Appears stated age Attitude/Demeanor/Rapport: Engaged Affect (typically observed): Accepting Orientation: : Oriented to Self, Oriented to Place, Oriented to  Time, Oriented to Situation   Psych Involvement: No (comment)  Admission diagnosis:  Nodule of upper lobe of right lung [R91.1] S/P lobectomy of lung [Z90.2] Status post robot-assisted surgical procedure [Z98.890] Patient Active Problem List   Diagnosis Date Noted   Acute hypoxic respiratory failure (HCC) 05/14/2024   S/P lobectomy of lung 05/09/2024   Status post robot-assisted surgical procedure 05/09/2024   AKI (acute kidney injury) 03/22/2024   Symptomatic bradycardia 03/20/2024   Shoulder arthritis 08/31/2023   Lumbar radiculopathy 05/30/2023   Major depressive disorder, recurrent severe without psychotic features (HCC) 01/28/2023   S/P lumbar fusion 08/23/2022   Postlaminectomy syndrome, not elsewhere classified 08/12/2022   Substance induced mood disorder (HCC) 08/06/2022   Suicide attempt by drug overdose (HCC) 08/06/2022   Toxic encephalopathy 08/03/2022   Suicide ideation 08/03/2022   Encephalopathy 08/03/2022   Polysubstance abuse (HCC) 04/28/2022   Postoperative abdominal pain 02/28/2022   Acute cholecystitis 02/17/2022  Obstruction of nasal valve 01/07/2022   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   Pulmonary nodule 12/17/2021   CAD S/P percutaneous coronary angioplasty 12/17/2021   Epigastric abdominal pain    Nausea and vomiting    Tobacco dependence due to cigarettes 08/31/2021   Iron deficiency anemia secondary to inadequate dietary iron intake 08/31/2021    Acute lead-induced gout involving toe of left foot 08/31/2021   Encounter for lipid screening for cardiovascular disease 08/31/2021   Hypotension due to drugs 06/21/2021   Opioid withdrawal (HCC) 06/21/2021   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HCC) 06/21/2021   RLS (restless legs syndrome) 05/07/2021   Acquired deviated nasal septum 05/03/2021   Encounter for surveillance of abnormal nevi 05/03/2021   Flu vaccine need 05/03/2021   Substance abuse (HCC) 05/03/2021   Drug abuse and dependence (HCC) 05/03/2021   Need for shingles vaccine 05/03/2021   Right groin pain 01/29/2021   S/P TKR (total knee replacement) using cement, right 01/19/2021   Cigarette smoker 07/21/2020   Foot pain, bilateral 12/25/2019   Potential exposure to STD 12/12/2019   History of lumbar surgery 12/12/2019   Fatigue 12/12/2019   Penile lesion 12/12/2019   Right testicular pain 12/12/2019   Body mass index 28.0-28.9, adult 12/12/2019   History of COPD 12/12/2019   Tobacco use disorder, continuous 12/12/2019   Hepatoma (HCC) 11/05/2019   Anxiety 12/10/2018   Essential hypertension 09/18/2018   Hypercholesteremia 09/18/2018   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   Chronic anticoagulation (Plavix ) 04/19/2018   Pain medication agreement broken ([facility]) 04/19/2018   Cocaine use 04/18/2018   Cocaine abuse (HCC) (See 04/12/18 UDS) 04/18/2018   Marijuana abuse (See 04/12/18 UDS) 04/18/2018   Long term current use of opiate analgesic 04/12/2018   Cirrhosis of liver (HCC) 04/03/2018   BPH (benign prostatic hyperplasia) 04/03/2018   Hematuria, microscopic 04/03/2018   Osteoarthritis of the knee (Left) 11/02/2017   Lumbar facet syndrome (Bilateral) 11/01/2017   Spondylosis without myelopathy or radiculopathy, lumbosacral region 11/01/2017   Osteoarthritis of shoulder (Right) 11/01/2017   Osteoarthritis of acromioclavicular joint (Right) 11/01/2017   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) 11/01/2017   Osteoarthritis  11/01/2017   Cervicalgia 11/01/2017   Cervical facet syndrome 11/01/2017   DDD (degenerative disc disease), cervical 11/01/2017   DDD (degenerative disc disease), lumbar 11/01/2017   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) 09/18/2017   Other chronic pain 09/18/2017   Chronic musculoskeletal pain 09/18/2017   Chronic pain of right knee 09/11/2017   Chronic low back pain (Secondary Area of Pain) (Bilateral) 09/11/2017   Chronic hip pain (Tertiary Area of Pain) (Bilateral) (R>L) 09/11/2017   Chronic shoulder pain (Fourth Area of Pain) (Right) 09/11/2017   Spondylosis without myelopathy or radiculopathy, cervical region 09/11/2017   Chronic pain syndrome 09/11/2017   Opiate use 09/11/2017   Screen for STD (sexually transmitted disease) 09/11/2017   Disorder of skeletal system 09/11/2017   Problems influencing health status 09/11/2017   Overdose of medication, intentional self-harm, sequela 12/28/2016   Severe recurrent major depression without psychotic features (HCC) 12/27/2016   Alcohol  use disorder, moderate, dependence (HCC) 12/27/2016   Cannabis use disorder, moderate, dependence (HCC) 12/27/2016   Bipolar 1 disorder (HCC) 05/27/2013   Myalgia 05/27/2013   Abnormal ejaculation 11/30/2012   Chest pain, unspecified 11/30/2012   Degenerative arthritis of hip 11/16/2012   Schizoaffective disorder (HCC) 06/27/2012   COPD with acute exacerbation (HCC) 06/27/2012   Chronic hepatitis C (HCC) 06/27/2012   GERD (gastroesophageal reflux disease) 06/27/2012   Complete rupture  of rotator cuff 02/10/2012   Cocaine abuse in remission (HCC) 12/05/2011   Unspecified osteoarthritis, unspecified site 07/27/2011   PCP:  Lorren Greig PARAS, NP Pharmacy:   [facility] [phone] [address], [place] - [address] [place] [address] Phone: [phone] Fax: [phone]  [facility] [address] [place] [address] Phone:  [phone] Fax: [phone]     Social Drivers of Health (SDOH) Social History: SDOH Screenings   Food Insecurity: No Food Insecurity (05/09/2024)  Recent Concern: Food Insecurity - Food Insecurity Present (04/16/2024)  Housing: Unknown (05/10/2024)  Recent Concern: Housing - High Risk (04/16/2024)  Transportation Needs: No Transportation Needs (05/09/2024)  Utilities: Not At Risk (05/09/2024)  Alcohol  Screen: Low Risk  (01/29/2023)  Depression (PHQ2-9): Medium Risk (04/05/2024)  Financial Resource Strain: High Risk (04/16/2024)  Physical Activity: Inactive (04/16/2024)  Social Connections: Moderately Integrated (05/10/2024)  Stress: Stress Concern Present (04/16/2024)  Tobacco Use: Medium Risk (05/09/2024)  Health Literacy: Adequate Health Literacy (12/19/2023)   SDOH Interventions:     Readmission Risk Interventions    03/01/2022   11:27 AM  Readmission Risk Prevention Plan  Transportation Screening Complete  HRI or Home Care Consult Complete  Social Work Consult for Recovery Care Planning/Counseling Complete  Palliative Care Screening Not Applicable  Medication Review [employer]) Complete

## 2024-05-14 NOTE — Progress Notes (Signed)
 TCTS 1:45am  Mr. Keith Gibbs's agitation worsened. ABG confirmed severe hypoxia with pO2 54, pCO2 39, pH 7.39.  Rapid response called. Arranged for transfer to ICU, consulted Dr. Layman with CCM.   Keith Becket, PA-C

## 2024-05-14 NOTE — Progress Notes (Signed)
 eLink Physician-Brief Progress Note Patient Name: Keith Gibbs DOB: [birth year] MRN: [id number]   Date of Service  05/14/2024  HPI/Events of Note  87 M history of COPD (FEV1 2.83L), Hep C, polysubstance abuse, chronic pain from spine surgeries, bipolar disorder, CAD s/p stents (2017 and 2021), HTN ,dyslipidemia. Recent admission for COPD exacerbation and found to have an enlarging spiculated RUL nodule hypermetabolic on PET. Underwent robotic assited RUL lobectomy with lymph node dissection. EBL 75 cc  Transferred to ICU 10/21 due to increased hypoxemia now requiring heated HFNC  Seen awake, not in distress BP 127/87  HR 76  O2 97% Wheezing as per bedside exam  CXR unchanged. Persistent right apical pneumothorax  eICU Interventions  Hypoxemic respiratory failure s/p lobectomy for adenocarcinoma (T1, N0). Given diuretics, systemic and inhaled corticosteroids and bronchodilators     Intervention Category Evaluation Type: New Patient Evaluation  Keith Gibbs 05/14/2024, 2:43 AM

## 2024-05-14 NOTE — Progress Notes (Signed)
 TCTS 02:16  Resting in the ICU, complains of right side chest pain similar to what he has had since surgery.  Now on heated high-flow O2 with SaO2 98-100%.   EMERSON Becket, PA-C

## 2024-05-15 ENCOUNTER — Inpatient Hospital Stay (HOSPITAL_COMMUNITY): Payer: MEDICAID

## 2024-05-15 ENCOUNTER — Ambulatory Visit (HOSPITAL_COMMUNITY): Payer: MEDICAID

## 2024-05-15 DIAGNOSIS — J810 Acute pulmonary edema: Secondary | ICD-10-CM | POA: Diagnosis not present

## 2024-05-15 DIAGNOSIS — F411 Generalized anxiety disorder: Secondary | ICD-10-CM

## 2024-05-15 DIAGNOSIS — F430 Acute stress reaction: Secondary | ICD-10-CM | POA: Diagnosis not present

## 2024-05-15 DIAGNOSIS — G9341 Metabolic encephalopathy: Secondary | ICD-10-CM

## 2024-05-15 DIAGNOSIS — F4329 Adjustment disorder with other symptoms: Secondary | ICD-10-CM

## 2024-05-15 DIAGNOSIS — F064 Anxiety disorder due to known physiological condition: Secondary | ICD-10-CM | POA: Diagnosis present

## 2024-05-15 DIAGNOSIS — J9621 Acute and chronic respiratory failure with hypoxia: Secondary | ICD-10-CM | POA: Diagnosis not present

## 2024-05-15 DIAGNOSIS — J449 Chronic obstructive pulmonary disease, unspecified: Secondary | ICD-10-CM | POA: Diagnosis not present

## 2024-05-15 LAB — CBC
HCT: 28.5 % — ABNORMAL LOW (ref 39.0–52.0)
Hemoglobin: 10 g/dL — ABNORMAL LOW (ref 13.0–17.0)
MCH: 33.4 pg (ref 26.0–34.0)
MCHC: 35.1 g/dL (ref 30.0–36.0)
MCV: 95.3 fL (ref 80.0–100.0)
Platelets: 163 K/uL (ref 150–400)
RBC: 2.99 MIL/uL — ABNORMAL LOW (ref 4.22–5.81)
RDW: 13.4 % (ref 11.5–15.5)
WBC: 10.6 K/uL — ABNORMAL HIGH (ref 4.0–10.5)
nRBC: 0 % (ref 0.0–0.2)

## 2024-05-15 LAB — RAPID URINE DRUG SCREEN, HOSP PERFORMED
Amphetamines: NOT DETECTED
Barbiturates: NOT DETECTED
Benzodiazepines: NOT DETECTED
Cocaine: NOT DETECTED
Opiates: NOT DETECTED
Tetrahydrocannabinol: NOT DETECTED

## 2024-05-15 LAB — BASIC METABOLIC PANEL WITH GFR
Anion gap: 10 (ref 5–15)
BUN: 31 mg/dL — ABNORMAL HIGH (ref 8–23)
CO2: 24 mmol/L (ref 22–32)
Calcium: 7.8 mg/dL — ABNORMAL LOW (ref 8.9–10.3)
Chloride: 95 mmol/L — ABNORMAL LOW (ref 98–111)
Creatinine, Ser: 1.23 mg/dL (ref 0.61–1.24)
GFR, Estimated: >60 mL/min (ref >60)
Glucose, Bld: 133 mg/dL — ABNORMAL HIGH (ref 70–99)
Potassium: 4.5 mmol/L (ref 3.5–5.1)
Sodium: 129 mmol/L — ABNORMAL LOW (ref 135–145)

## 2024-05-15 LAB — VITAMIN B12: Vitamin B-12: 875 pg/mL (ref 180–914)

## 2024-05-15 LAB — GLUCOSE, CAPILLARY
Glucose-Capillary: 139 mg/dL — ABNORMAL HIGH (ref 70–99)
Glucose-Capillary: 137 mg/dL — ABNORMAL HIGH (ref 70–99)
Glucose-Capillary: 120 mg/dL — ABNORMAL HIGH (ref 70–99)

## 2024-05-15 LAB — TSH: TSH: 0.44 u[IU]/mL (ref 0.350–4.500)

## 2024-05-15 LAB — MAGNESIUM: Magnesium: 2.6 mg/dL — ABNORMAL HIGH (ref 1.7–2.4)

## 2024-05-15 LAB — VALPROIC ACID LEVEL: Valproic Acid Lvl: 52 ug/mL (ref 50–100)

## 2024-05-15 MED ORDER — LACTULOSE 10 GM/15ML PO SOLN
20.0000 g | Freq: Three times a day (TID) | ORAL | Status: DC
  Administered 2024-05-15: 20 g via ORAL
  Filled 2024-05-15 (×2): qty 30

## 2024-05-15 MED ORDER — LACTULOSE 10 GM/15ML PO SOLN
20.0000 g | ORAL | Status: DC
  Administered 2024-05-15 (×3): 20 g via ORAL
  Filled 2024-05-15 (×3): qty 30

## 2024-05-15 MED ORDER — QUETIAPINE FUMARATE 50 MG PO TABS
150.0000 mg | ORAL_TABLET | Freq: Two times a day (BID) | ORAL | Status: DC
  Administered 2024-05-15: 150 mg via ORAL
  Filled 2024-05-15: qty 1

## 2024-05-15 MED ORDER — FUROSEMIDE 10 MG/ML IJ SOLN
40.0000 mg | Freq: Two times a day (BID) | INTRAMUSCULAR | Status: DC
  Administered 2024-05-15 – 2024-05-19 (×10): 40 mg via INTRAVENOUS
  Filled 2024-05-15 (×9): qty 4

## 2024-05-15 MED ORDER — BUDESONIDE 0.25 MG/2ML IN SUSP
0.2500 mg | Freq: Two times a day (BID) | RESPIRATORY_TRACT | Status: DC
  Administered 2024-05-15 – 2024-05-26 (×23): 0.25 mg via RESPIRATORY_TRACT
  Filled 2024-05-15 (×23): qty 2

## 2024-05-15 MED ORDER — METHYLPREDNISOLONE SODIUM SUCC 40 MG IJ SOLR
40.0000 mg | Freq: Every day | INTRAMUSCULAR | Status: DC
  Administered 2024-05-15: 40 mg via INTRAVENOUS
  Filled 2024-05-15 (×2): qty 1

## 2024-05-15 MED ORDER — REVEFENACIN 175 MCG/3ML IN SOLN
175.0000 ug | Freq: Every day | RESPIRATORY_TRACT | Status: DC
  Administered 2024-05-15 – 2024-06-04 (×21): 175 ug via RESPIRATORY_TRACT
  Filled 2024-05-15 (×22): qty 3

## 2024-05-15 MED ORDER — HYDROXYZINE HCL 25 MG PO TABS
50.0000 mg | ORAL_TABLET | Freq: Every day | ORAL | Status: DC
  Administered 2024-05-15: 50 mg via ORAL
  Filled 2024-05-15: qty 2

## 2024-05-15 MED ORDER — QUETIAPINE FUMARATE 100 MG PO TABS
200.0000 mg | ORAL_TABLET | Freq: Every day | ORAL | Status: DC
  Administered 2024-05-16 – 2024-05-18 (×3): 200 mg via ORAL
  Filled 2024-05-15 (×3): qty 2

## 2024-05-15 MED ORDER — ARFORMOTEROL TARTRATE 15 MCG/2ML IN NEBU
15.0000 ug | INHALATION_SOLUTION | Freq: Two times a day (BID) | RESPIRATORY_TRACT | Status: DC
  Administered 2024-05-15 – 2024-06-04 (×41): 15 ug via RESPIRATORY_TRACT
  Filled 2024-05-15 (×41): qty 2

## 2024-05-15 MED ORDER — SENNOSIDES-DOCUSATE SODIUM 8.6-50 MG PO TABS
2.0000 | ORAL_TABLET | Freq: Every day | ORAL | Status: DC
  Administered 2024-05-15 – 2024-05-18 (×4): 2 via ORAL
  Filled 2024-05-15 (×5): qty 2

## 2024-05-15 NOTE — Consult Note (Signed)
 [facility] Psychiatric Consult Initial  Patient Name: .Keith Gibbs  MRN: [id number]  DOB: 1960-12-14  Consult Order details:  Orders (From admission, onward)     Start     Ordered   05/15/24 0927  IP CONSULT TO PSYCHIATRY       Ordering Provider: Sharie Bourbon, MD  Provider:  (Not yet assigned)  Question Answer Comment  Location [facility]   Reason for Consult? anxious with hx of schizoaffective disorder bipolar type, needs optimization of psych meds      05/15/24 0929             Mode of Visit: In person    Psychiatry Consult Evaluation  Service Date: May 15, 2024 LOS:  LOS: 6 days  Chief Complaint anxiety with hx of schizoaffective disorder bipolar type, needs optimization of psych meds.   Primary Psychiatric Diagnoses  GAD 2.  Acute stress reaction 3.  Adjustment disorder  Assessment  Keith Gibbs is a 63 y.o. male admitted: Medicallyfor 05/09/2024 10:27 AM for lobectomy in the setting of lung cancer. He carries the psychiatric diagnoses of PTSD, MDD and has a past medical history of  COPD and lung cancer.  His current presentation of heightened anxiety, restlessness, and concern related to new medical diagnoses and oxygen dependency is most consistent with an Adjustment Disorder with Anxiety. He meets criteria for this diagnosis based on the presence of identifiable stressors (recent lobectomy, cancer diagnosis, and oxygen dependence) leading to emotional distress and anxiety that exceed expected adaptive responses, without evidence of a primary anxiety or mood disorder exacerbation.  Current outpatient psychotropic medications include Seroquel , Depakote , Cymbalta , Gabapentin , and Hydroxyzine , and historically he has had a good response to this regimen. He was compliant with medications prior to admission as evidenced by CDIOP documentation, recent clinic visits, and stable outpatient progress.  On initial examination, patient appeared  anxious but cooperative, with appropriate affect and no evidence of psychosis, mania, or suicidal ideation. He demonstrates insight into his condition and motivation to continue sobriety and treatment adherence.  Patient is alert and oriented 4. He is cooperative and engages appropriately during interview. Speech is normal in rate, tone, and volume. Thought process is logical and goal-directed. Mood described as "anxious but trying to stay strong," with affect congruent. No evidence of psychosis, mania, or cognitive deficits noted. Insight and judgment appear fair.  Chart review shows his psychotropic medications were resumed at incorrect dosages, which have since been corrected. He is currently prescribed Seroquel  200 mg PO at bedtime, Depakote  250 mg PO daily and 1500 mg PO at bedtime, Cymbalta  90 mg PO daily, Gabapentin  600 mg PO daily and 1200 mg at bedtime, and Hydroxyzine  25 mg PO BID PRN. He is also receiving methylprednisolone , which can contribute to anxiety or manic symptoms.  Diagnoses:  Active Hospital problems: Principal Problem:   S/P lobectomy of lung Active Problems:   Status post robot-assisted surgical procedure   Acute hypoxic respiratory failure (HCC)    Plan   ## Psychiatric Medication Recommendations:  Continue Seroquel  200 mg PO QHS, Depakote  250 mg PO daily and 1500 mg PO QHS, Cymbalta  90 mg PO daily, Gabapentin  600 mg PO daily and 1200 mg QHS, and Hydroxyzine  25 mg PO BID PRN as ordered.  Add Seroquel  25 mg PO BID PRN (8 AM and 4 PM) to target situational anxiety and potential steroid-related agitation or hypomania, though none currently observed.  Continue PRN Hydroxyzine  for breakthrough anxiety as needed.  Recommend  cardiopulmonary team involvement to support patient education and coping skills regarding new oxygen use.  Encourage use of relaxation and grounding techniques for anxiety management.  Reinforce continued sobriety and CDIOP follow-up  post-discharge.  No indication for inpatient psychiatric admission; patient remains appropriate for continued medical care.  Psychiatry will continue to follow as needed.  ## Medical Decision Making Capacity: Not specifically addressed in this encounter  ## Further Work-up:  -- No UDS obtained on admission(too late). Will add on TSH, B1, B12.  TSH, B12, folate, EKG, While pt on Qtc prolonging medications, please monitor & replete K+ to 4 and Mg2+ to 2, TOC consult for substance abuse resources, U/A, or UDS -- most recent EKG on 10/21 had QtC of 428 -- Pertinent labwork reviewed earlier this admission includes: VPA level-52, WBC 9.1> 10.6, hgb 10.8<10.0,Troponin 58>47, hyponatremia 129, magnesium  1.8> 2.6   ## Disposition:-- There are no psychiatric contraindications to discharge at this time  ## Behavioral / Environmental: - No specific recommendations at this time.     ## Safety and Observation Level:  - Based on my clinical evaluation, I estimate the patient to be at low risk of self harm in the current setting. - At this time, we recommend  routine. This decision is based on my review of the chart including patient's history and current presentation, interview of the patient, mental status examination, and consideration of suicide risk including evaluating suicidal ideation, plan, intent, suicidal or self-harm behaviors, risk factors, and protective factors. This judgment is based on our ability to directly address suicide risk, implement suicide prevention strategies, and develop a safety plan while the patient is in the clinical setting. Please contact our team if there is a concern that risk level has changed.  CSSR Risk Category:C-SSRS RISK CATEGORY: No Risk  Suicide Risk Assessment: Patient has following modifiable risk factors for suicide: current symptoms: anxiety/panic, insomnia, impulsivity, anhedonia, hopelessness, triggering events, recent psychiatric hospitalization, and  recent loss (death, isolation, vocation), which we are addressing by medication management, close inpatient follow-up.  Patient has following non-modifiable or demographic risk factors for suicide: male gender, history of suicide attempt, history of self harm behavior, psychiatric hospitalization, and family h/o suicide Patient has the following protective factors against suicide: Access to outpatient mental health care, Supportive family, Supportive friends, Cultural, spiritual, or religious beliefs that discourage suicide, and Minor children in the home  Thank you for this consult request. Recommendations have been communicated to the primary team.  We will continue to follow from a distance at this time.   Majel GORMAN Ramp, FNP       History of Present Illness  Relevant Aspects of [facility] Course:  Admitted on 05/09/2024 for male presented for lobectomy with CTS. Progressing as expected post operatively with CT in place and was on 4E when this morning he decompensated with worsening hypoxemia and increased wob. CTS transferred to ICU and requested CCM evaluation.   Patient Report:  Patient seen and assessed by this provider and Dr. Fredia. He reports an increase in anxiety during this hospitalization following a recent cancer diagnosis, lobectomy, and increased oxygen requirement. He expresses distress over the "newness" of wearing oxygen, sharing that prior to this admission, he had never required it and now worries about what this means for his future. He discussed his significant personal losses, including the death of his wife from lung cancer and his daughter from a motor vehicle crash a few months later. Despite these tragedies, he notes he has  remained resilient and has worked hard to maintain his sobriety and mental stability.  He denies suicidal or homicidal ideations at this time. He is followed by [facility] provider Avimor and receives outpatient services through the [facility]  clinic. Patient reports being clean from cocaine and alcohol  since February 11, 2024, and remains sober. He has been active in group sessions and had his last outpatient visit on April 25, 2024, prior to this hospital admission.  He states that his anxiety has been manageable but heightened due to medical stressors and concerns about his breathing. He was appreciative of the support and education provided during the encounter.  Psych ROS:  Depression: Yes sadness, isolation Anxiety:  Tremors, fear, excessive worrying Mania (lifetime and current): Denies Psychosis: (lifetime and current): Denies  Collateral information:  Mom not available.   Review of Systems  Respiratory:  Positive for shortness of breath.   Psychiatric/Behavioral:  Positive for depression. The patient is nervous/anxious and has insomnia.   All other systems reviewed and are negative.    Psychiatric and Social History  Psychiatric History:  Information collected from patient and chart review  Prev Dx/Sx: MDD, GAD, PTSD Current Psych Provider: Dr. Ismael Franco Home Meds (current): Seroquel  200 mg PO QHS, Depakote  250 mg PO daily and 1500 mg PO QHS, Cymbalta  90 mg PO daily, Gabapentin  600 mg PO daily and 1200 mg QHS, and Hydroxyzine  25 mg PO BID PRN Previous Med Trials: Unable to recall Therapy: [facility] at [facility]  Prior Psych Hospitalization: Yes 2x  Prior Self Harm: Denies Prior Violence: Denies  Family Psych History: Yes his paternal uncle and paternal grandmother Family Hx suicide: Yes paternal uncle and grandmother  Social History:  Developmental Hx: WNL Educational Hx: High school Occupational Hx: Unemployed Legal Hx: Denies Living Situation: Lives with his mother Spiritual Hx: [employer] Access to weapons/lethal means: Denies   Substance History Alcohol : Denies at this time  Type of alcohol : noen Last Drink 01/2024 Number of drinks per day n/a History of alcohol  withdrawal seizures n/a History of DT's  N/a Tobacco: Yes 1/2 ppd Illicit drugs: Denies Prescription drug abuse: Denies Rehab hx: Denies  Exam Findings  Physical Exam: Appears older than age stated Vital Signs:  Temp:  [97.7 F (36.5 C)-98.6 F (37 C)] 97.7 F (36.5 C) (10/22 0748) Pulse Rate:  [55-202] 67 (10/22 1000) Resp:  [9-23] 18 (10/22 1000) BP: (105-144)/(59-106) 127/69 (10/22 0900) SpO2:  [86 %-98 %] 90 % (10/22 1000) FiO2 (%):  [80 %-100 %] 80 % (10/22 0752) Blood pressure 127/69, pulse 67, temperature 97.7 F (36.5 C), temperature source Oral, resp. rate 18, height 6' 3 (1.905 m), weight 116.7 kg, SpO2 90%. Body mass index is 32.16 kg/m.  Physical Exam Vitals and nursing note reviewed.  Constitutional:      Appearance: Normal appearance. He is normal weight.  Skin:    Capillary Refill: Capillary refill takes less than 2 seconds.  Neurological:     General: No focal deficit present.     Mental Status: He is alert and oriented to person, place, and time. Mental status is at baseline.  Psychiatric:        Attention and Perception: He is attentive. He does not perceive auditory or visual hallucinations.        Mood and Affect: Mood is anxious and depressed.        Speech: Speech normal.        Behavior: Behavior normal. Behavior is cooperative.  Thought Content: Thought content normal.        Cognition and Memory: Cognition and memory normal.        Judgment: Judgment normal.     Mental Status Exam: General Appearance: Disheveled  Orientation:  Full (Time, Place, and Person)  Memory:  Immediate;   Fair Recent;   Fair  Concentration:  Concentration: Fair and Attention Span: Good  Recall:  Good  Attention  Fair  Eye Contact:  Good  Speech:  Clear and Coherent and Normal Rate  Language:  Good  Volume:  Normal  Mood: Anxious  Affect:  Appropriate and Congruent  Thought Process:  Disorganized and Linear  Thought Content:  Logical  Suicidal Thoughts:  No  Homicidal Thoughts:  No   Judgement:  Intact  Insight:  Present  Psychomotor Activity:  Normal  Akathisia:  Yes  Fund of Knowledge:  Fair   Assets:  Manufacturing systems engineer Desire for Improvement Financial Resources/Insurance Housing Intimacy Physical Health Resilience Social Support  Cognition:  WNL  ADL's:  Intact  AIMS (if indicated):        Other History   These have been pulled in through the EMR, reviewed, and updated if appropriate.  Family History:  The patient's family history includes Depression in his mother; Early death in his father; Heart attack in his father; Heart disease in his father and mother; Hypertension in his father, mother, and sister; Osteoarthritis in his mother; Parkinson's disease in his maternal grandfather.  Medical History: Past Medical History:  Diagnosis Date   Anginal pain    Anxiety    Arthritis    Bipolar 1 disorder (HCC)    Bursitis    CAD (coronary artery disease)    Stent placed 2017   Cancer (HCC)    Chronic pain    COPD (chronic obstructive pulmonary disease) (HCC)    Current use of long term anticoagulation    DAPT (ASA + clopidogrel )   Depression    Diverticulitis    Dyspnea    GERD (gastroesophageal reflux disease)    Grade I diastolic dysfunction    Hepatitis C 2012   No longer has Hep C   HLD (hyperlipidemia)    Hypertension    MI (myocardial infarction) (HCC)    Polysubstance abuse (HCC)    cocaine, marijuana, ETOH   PUD (peptic ulcer disease)    S/P angioplasty with stent 06/10/2016   a.) 90% stenosis of pLAD to mLAD - 2.5 x 18 mm Xience Alpine (DES x 1) placed to pLAD   S/P PTCA (percutaneous transluminal coronary angioplasty) 12/04/2019   a.) 60% in stent restenosis of DES to pLAD; LVEF 65%.   Schizophrenia (HCC)    Stroke (HCC)    Tremors    generalized   Valvular insufficiency    a.) Mild MR, TR, PR; mild to moderate AR on 03/05/2018 TTE    Surgical History: Past Surgical History:  Procedure Laterality Date   ABDOMINAL  SURGERY     removed small piece of intestines due to [facility] Diverticulosis   ANTERIOR LAT LUMBAR FUSION N/A 08/23/2022   Procedure: DIRECT LATERAL INTERBODY FUSION  LUMBAR TWO- LUMBAR THREE, LUMBAR THREE-LUMBAR FOUR , EXPLORE AND EXTEND FUSION LUMBAR TWO-LUMBAR FIVE, POSTERIOR DECOMPRESSION LUMBAR THREE-LUMBAR FOUR, LEFT LUMBAR TWO-LUMBAR THREE;  Surgeon: Debby Dorn MATSU, MD;  Location: [facility] OR;  Service: Neurosurgery;  Laterality: N/A;   APPENDECTOMY     BACK SURGERY     CARDIAC CATHETERIZATION Left 06/10/2016   Procedure: Left Heart Cath  and Coronary Angiography;  Surgeon: Denyse DELENA Bathe, MD;  Location: [facility] INVASIVE CV LAB;  Service: Cardiovascular;  Laterality: Left;   CARDIAC CATHETERIZATION N/A 06/10/2016   Procedure: Coronary Stent Intervention;  Surgeon: Cara JONETTA Lovelace, MD;  Location: [facility] INVASIVE CV LAB;  Service: Cardiovascular;  Laterality: N/A;   CHOLECYSTECTOMY N/A 02/17/2022   Procedure: LAPAROSCOPIC CHOLECYSTECTOMY;  Surgeon: Stevie Herlene Righter, MD;  Location: [facility] OR;  Service: General;  Laterality: N/A;   COLON SURGERY     COLONOSCOPY     COLONOSCOPY WITH PROPOFOL  N/A 01/05/2017   Procedure: COLONOSCOPY WITH PROPOFOL ;  Surgeon: Therisa Bi, MD;  Location: [facility] ENDOSCOPY;  Service: Endoscopy;  Laterality: N/A;   COLONOSCOPY WITH PROPOFOL  N/A 02/13/2020   Procedure: COLONOSCOPY WITH PROPOFOL ;  Surgeon: Janalyn Keene NOVAK, MD;  Location: [facility] ENDOSCOPY;  Service: Endoscopy;  Laterality: N/A;   CORONARY ANGIOPLASTY WITH STENT PLACEMENT     CORONARY PRESSURE/FFR STUDY N/A 12/04/2019   Procedure: INTRAVASCULAR PRESSURE WIRE/FFR STUDY;  Surgeon: Wonda Sharper, MD;  Location: [facility] INVASIVE CV LAB;  Service: Cardiovascular;  Laterality: N/A;   ESOPHAGOGASTRODUODENOSCOPY (EGD) WITH PROPOFOL  N/A 01/05/2017   Procedure: ESOPHAGOGASTRODUODENOSCOPY (EGD) WITH PROPOFOL ;  Surgeon: Therisa Bi, MD;  Location: [facility] ENDOSCOPY;  Service: Endoscopy;  Laterality: N/A;    ESOPHAGOGASTRODUODENOSCOPY (EGD) WITH PROPOFOL  N/A 02/13/2020   Procedure: ESOPHAGOGASTRODUODENOSCOPY (EGD) WITH PROPOFOL ;  Surgeon: Janalyn Keene NOVAK, MD;  Location: [facility] ENDOSCOPY;  Service: Endoscopy;  Laterality: N/A;   INTERCOSTAL NERVE BLOCK  05/09/2024   Procedure: BLOCK, NERVE, INTERCOSTAL;  Surgeon: Kerrin Elspeth BROCKS, MD;  Location: [facility] OR;  Service: Thoracic;;   KNEE ARTHROSCOPY WITH MEDIAL MENISECTOMY Right 09/05/2017   Procedure: KNEE ARTHROSCOPY WITH MEDIAL AND LATERAL  MENISECTOMY PARTIAL SYNOVECTOMY;  Surgeon: Kathlynn Sharper, MD;  Location: [facility] ORS;  Service: Orthopedics;  Laterality: Right;   LEFT HEART CATH AND CORONARY ANGIOGRAPHY N/A 12/04/2019   Procedure: LEFT HEART CATH AND CORONARY ANGIOGRAPHY;  Surgeon: Wonda Sharper, MD;  Location: [facility] INVASIVE CV LAB;  Service: Cardiovascular;  Laterality: N/A;   LEFT HEART CATH AND CORONARY ANGIOGRAPHY Left 11/25/2022   Procedure: LEFT HEART CATH AND CORONARY ANGIOGRAPHY;  Surgeon: Bathe Denyse DELENA, MD;  Location: [facility] INVASIVE CV LAB;  Service: Cardiovascular;  Laterality: Left;   LOBECTOMY, LUNG, ROBOT-ASSISTED, USING VATS Right 05/09/2024   Procedure: LOBECTOMY, LUNG RIGHT UPPER LOBE, ROBOT-ASSISTED, USING VATS;  Surgeon: Kerrin Elspeth BROCKS, MD;  Location: [facility] OR;  Service: Thoracic;  Laterality: Right;   LYMPH NODE BIOPSY  05/09/2024   Procedure: LYMPH NODE BIOPSY;  Surgeon: Kerrin Elspeth BROCKS, MD;  Location: [facility] OR;  Service: Thoracic;;   REVERSE SHOULDER ARTHROPLASTY Right 08/31/2023   Procedure: RIGHT REVERSE SHOULDER ARTHROPLASTY;  Surgeon: [name], MD;  Location: [facility] OR;  Service: Orthopedics;  Laterality: Right;   SHOULDER SURGERY Right 04/09/2012   SPINE SURGERY     TOTAL KNEE ARTHROPLASTY Right 01/19/2021   Procedure: TOTAL KNEE ARTHROPLASTY - [place] Amber to Assist;  Surgeon: Kathlynn Sharper, MD;  Location: [facility] ORS;  Service: Orthopedics;  Laterality: Right;   WEDGE RESECTION, LUNG, ROBOT-ASSISTED,  THORACOSCOPIC Right 05/09/2024   Procedure: WEDGE RESECTION, LUNG RIGHT UPPER LOBE, ROBOT-ASSISTED, THORACOSCOPIC;  Surgeon: Kerrin Elspeth BROCKS, MD;  Location: [facility] OR;  Service: Thoracic;  Laterality: Right;     Medications:   Current Facility-Administered Medications:    ALPRAZolam (XANAX) tablet 0.25 mg, 0.25 mg, Oral, Q8H PRN, Hendrickson, Steven C, MD, 0.25 mg at 05/15/24 0221   arformoterol (BROVANA) nebulizer solution 15 mcg, 15 mcg, Nebulization,  BID, Sharie Bourbon, MD   aspirin  EC tablet 81 mg, 81 mg, Oral, Daily, Barrett, Erin R, PA-C, 81 mg at 05/15/24 9041   atorvastatin  (LIPITOR ) tablet 80 mg, 80 mg, Oral, Daily, Barrett, Erin R, PA-C, 80 mg at 05/15/24 9042   budesonide  (PULMICORT ) nebulizer solution 0.25 mg, 0.25 mg, Nebulization, BID, Sharie Bourbon, MD   celecoxib  (CELEBREX ) capsule 200 mg, 200 mg, Oral, BID, Barrett, Erin R, PA-C, 200 mg at 05/15/24 9040   Chlorhexidine  Gluconate Cloth 2 % PADS 6 each, 6 each, Topical, Daily, Hendrickson, Steven C, MD, 6 each at 05/15/24 1001   divalproex  (DEPAKOTE ) DR tablet 1,000 mg, 1,000 mg, Oral, QHS, Barrett, Erin R, PA-C, 1,000 mg at 05/14/24 2148   divalproex  (DEPAKOTE ) DR tablet 250 mg, 250 mg, Oral, q morning, Barrett, Erin R, PA-C, 250 mg at 05/15/24 1038   divalproex  (DEPAKOTE ) DR tablet 500 mg, 500 mg, Oral, Q lunch, Barrett, Erin R, PA-C, 500 mg at 05/14/24 1323   DULoxetine  (CYMBALTA ) DR capsule 30 mg, 30 mg, Oral, TID, Barrett, Erin R, PA-C, 30 mg at 05/15/24 9043   enoxaparin  (LOVENOX ) injection 40 mg, 40 mg, Subcutaneous, Daily, Barrett, Erin R, PA-C, 40 mg at 05/15/24 1010   fentaNYL  (SUBLIMAZE ) injection 25 mcg, 25 mcg, Intravenous, Q2H PRN, Barrett, Erin R, PA-C, 25 mcg at 05/15/24 0055   furosemide (LASIX) injection 40 mg, 40 mg, Intravenous, BID, Hendrickson, Steven C, MD, 40 mg at 05/15/24 9187   gabapentin  (NEURONTIN ) capsule 600 mg, 600 mg, Oral, Daily, 600 mg at 05/15/24 0957 **AND** gabapentin  (NEURONTIN )  capsule 1,200 mg, 1,200 mg, Oral, QHS, Barrett, Erin R, PA-C, 1,200 mg at 05/14/24 2147   hydrALAZINE  (APRESOLINE ) injection 10 mg, 10 mg, Intravenous, Q6H PRN, Barrett, Erin R, PA-C   hydrOXYzine  (ATARAX ) tablet 10 mg, 10 mg, Oral, TID PRN, Gold, Wayne E, PA-C, 10 mg at 05/13/24 1739   ipratropium-albuterol  (DUONEB) 0.5-2.5 (3) MG/3ML nebulizer solution 3 mL, 3 mL, Nebulization, Q4H, Marshall, Jessica, DO, 3 mL at 05/15/24 9247   isosorbide  mononitrate (IMDUR ) 24 hr tablet 30 mg, 30 mg, Oral, Daily, Barrett, Erin R, PA-C, 30 mg at 05/15/24 9041   ketorolac  (TORADOL ) 15 MG/ML injection 15 mg, 15 mg, Intravenous, Q6H PRN, Daniel Con RAMAN, MD, 15 mg at 05/14/24 2251   lactulose (CHRONULAC) 10 GM/15ML solution 20 g, 20 g, Oral, Q2H, Hussein, Abdullahi, MD   lactulose (CHRONULAC) 10 GM/15ML solution 30 g, 30 g, Oral, Daily PRN, Gold, Wayne E, PA-C, 30 g at 05/13/24 9051   lidocaine  (LIDODERM ) 5 % 1 patch, 1 patch, Transdermal, Daily, Su, Con RAMAN, MD, 1 patch at 05/15/24 1009   methylPREDNISolone  sodium succinate (SOLU-MEDROL ) 40 mg/mL injection 40 mg, 40 mg, Intravenous, Daily, Sharie Bourbon, MD   nicotine  (NICODERM CQ  - dosed in mg/24 hours) patch 21 mg, 21 mg, Transdermal, Daily, Kerrin Elspeth BROCKS, MD, 21 mg at 05/15/24 1009   ondansetron  (ZOFRAN ) injection 4 mg, 4 mg, Intravenous, Q6H PRN, Barrett, Erin R, PA-C   oxyCODONE  (Oxy IR/ROXICODONE ) immediate release tablet 10-15 mg, 10-15 mg, Oral, Q4H PRN, Barrett, Erin R, PA-C, 15 mg at 05/15/24 9177   pantoprazole  (PROTONIX ) EC tablet 40 mg, 40 mg, Oral, Daily, Barrett, Erin R, PA-C, 40 mg at 05/15/24 0958   [START ON 05/16/2024] QUEtiapine  (SEROQUEL ) tablet 200 mg, 200 mg, Oral, QHS, Starkes-Perry, Majel RAMAN, FNP   revefenacin (YUPELRI) nebulizer solution 175 mcg, 175 mcg, Nebulization, Daily, Hussein, Abdullahi, MD   senna-docusate (Senokot-S) tablet 2 tablet, 2 tablet, Oral, QHS,  Sharie Bourbon, MD   sodium chloride  flush (NS) 0.9 %  injection 10-40 mL, 10-40 mL, Intracatheter, Q12H, Kerrin Elspeth BROCKS, MD, 10 mL at 05/15/24 1001   sodium chloride  flush (NS) 0.9 % injection 10-40 mL, 10-40 mL, Intracatheter, PRN, Kerrin Elspeth BROCKS, MD   traMADol  (ULTRAM ) tablet 50-100 mg, 50-100 mg, Oral, Q6H PRN, Barrett, Erin R, PA-C, 50 mg at 05/11/24 2105  Allergies: Allergies  Allergen Reactions   Asenapine Other (See Comments) and Nausea And Vomiting    Increased tremors   Dextromethorphan Hbr    Guaifenesin     Latuda [Lurasidone Hcl] Other (See Comments)    Tremors     Lurasidone Other (See Comments) and Hives    Other Reaction(s): Angioedema   Phenylephrine      Majel GORMAN Ramp, FNP

## 2024-05-15 NOTE — Progress Notes (Signed)
 6 Days Post-Op Procedure(s) (LRB): WEDGE RESECTION, LUNG RIGHT UPPER LOBE, ROBOT-ASSISTED, THORACOSCOPIC (Right) LOBECTOMY, LUNG RIGHT UPPER LOBE, ROBOT-ASSISTED, USING VATS (Right) LYMPH NODE BIOPSY BLOCK, NERVE, INTERCOSTAL Subjective: Feels a little better  Objective: Vital signs in last 24 hours: Temp:  [97.8 F (36.6 C)-98.6 F (37 C)] 98.6 F (37 C) (10/21 2330) Pulse Rate:  [55-202] 55 (10/22 0700) Cardiac Rhythm: Sinus bradycardia (10/22 0500) Resp:  [9-23] 14 (10/22 0700) BP: (105-144)/(59-106) 105/60 (10/22 0700) SpO2:  [86 %-98 %] 98 % (10/22 0700) FiO2 (%):  [90 %-100 %] 90 % (10/22 0500)  Hemodynamic parameters for last 24 hours:    Intake/Output from previous day: 10/21 0701 - 10/22 0700 In: 1155.3 [P.O.:1090; I.V.:20; IV Piggyback:45.3] Out: 1655 [Urine:1550; Chest Tube:105] Intake/Output this shift: No intake/output data recorded.  General appearance: alert, cooperative, and no distress Neurologic: intact Heart: regular rate and rhythm Lungs: rhonchi bilaterally Abdomen: normal findings: soft, non-tender + air leak  Lab Results: Recent Labs    05/14/24 0550 05/15/24 0315  WBC 9.1 10.6*  HGB 10.8* 10.0*  HCT 30.5* 28.5*  PLT 161 163   BMET:  Recent Labs    05/14/24 0550 05/15/24 0315  NA 130* 129*  K 4.0 4.5  CL 96* 95*  CO2 24 24  GLUCOSE 127* 133*  BUN 17 31*  CREATININE 1.03 1.23  CALCIUM  8.1* 7.8*    PT/INR: No results for input(s): LABPROT, INR in the last 72 hours. ABG    Component Value Date/Time   PHART 7.39 05/14/2024 0034   HCO3 23.6 05/14/2024 0034   TCO2 28 05/30/2023 1229   ACIDBASEDEF 1.2 05/14/2024 0034   O2SAT 85.8 05/14/2024 0034   CBG (last 3)  Recent Labs    05/14/24 1127 05/14/24 1535 05/14/24 2105  GLUCAP 124* 167* 130*    Assessment/Plan: S/P Procedure(s) (LRB): WEDGE RESECTION, LUNG RIGHT UPPER LOBE, ROBOT-ASSISTED, THORACOSCOPIC (Right) LOBECTOMY, LUNG RIGHT UPPER LOBE, ROBOT-ASSISTED, USING  VATS (Right) LYMPH NODE BIOPSY BLOCK, NERVE, INTERCOSTAL - Respiratory status improved although CXR shows little change Continue diuresis Bronchodilators per CCM Wean O2! Keep CT to water  seal Pain control better Mobilize ECHO showed normal LV function, troponin minimally elevated with normal ECG- no evidence for cardiac etiology   LOS: 6 days    Keith Gibbs 05/15/2024

## 2024-05-15 NOTE — Progress Notes (Signed)
 EVENING ROUNDS NOTE :     [address]       [place] [id number]             [phone]                 6 Days Post-Op Procedure(s) (LRB): WEDGE RESECTION, LUNG RIGHT UPPER LOBE, ROBOT-ASSISTED, THORACOSCOPIC (Right) LOBECTOMY, LUNG RIGHT UPPER LOBE, ROBOT-ASSISTED, USING VATS (Right) LYMPH NODE BIOPSY BLOCK, NERVE, INTERCOSTAL   Total Length of Stay:  LOS: 6 days  Events:   No events Stable day    BP (!) 122/92   Pulse 66   Temp 98 F (36.7 C) (Oral)   Resp 18   Ht 6' 3 (1.905 m)   Wt 116.7 kg   SpO2 (!) 89%   BMI 32.16 kg/m      FiO2 (%):  [80 %-100 %] 90 %    I/O last 3 completed shifts: In: 1635.3 [P.O.:1570; I.V.:20; IV Piggyback:45.3] Out: 3875 [Urine:3700; Chest Tube:175]      Latest Ref Rng & Units 05/15/2024    3:15 AM 05/14/2024    5:50 AM 05/12/2024    3:57 AM  CBC  WBC 4.0 - 10.5 K/uL 10.6  9.1  8.1   Hemoglobin 13.0 - 17.0 g/dL 89.9  89.1  89.1   Hematocrit 39.0 - 52.0 % 28.5  30.5  31.6   Platelets 150 - 400 K/uL 163  161  127        Latest Ref Rng & Units 05/15/2024    3:15 AM 05/14/2024    5:50 AM 05/12/2024    3:57 AM  BMP  Glucose 70 - 99 mg/dL 866  872  890   BUN 8 - 23 mg/dL 31  17  13    Creatinine 0.61 - 1.24 mg/dL 8.76  8.96  9.06   Sodium 135 - 145 mmol/L 129  130  134   Potassium 3.5 - 5.1 mmol/L 4.5  4.0  4.0   Chloride 98 - 111 mmol/L 95  96  101   CO2 22 - 32 mmol/L 24  24  26    Calcium  8.9 - 10.3 mg/dL 7.8  8.1  7.9     ABG    Component Value Date/Time   PHART 7.39 05/14/2024 0034   PCO2ART 39 05/14/2024 0034   PO2ART 54 (L) 05/14/2024 0034   HCO3 23.6 05/14/2024 0034   TCO2 28 05/30/2023 1229   ACIDBASEDEF 1.2 05/14/2024 0034   O2SAT 85.8 05/14/2024 0034       Linnie Rayas, MD 05/15/2024 5:57 PM

## 2024-05-15 NOTE — Progress Notes (Addendum)
 NAME:  Keith Gibbs, MRN:  [id number], DOB:  [birth year], LOS: 6 ADMISSION DATE:  05/09/2024, CONSULTATION DATE: 05/14/2024 REFERRING MD: CTS, CHIEF COMPLAINT: Acute on chronic hypoxic respiratory failure  History of Present Illness:  63 yo male presented for lobectomy with CTS. Progressing as expected post operatively with CT in place and was on 4E when this morning he decompensated with worsening hypoxemia and increased wob. CTS transferred to ICU and requested CCM evaluation.     Pertinent  Medical History  RUL nodule s/p RUL lobectomy Bipolar 1 d/o Schizophrenia Polysubstance abuse Cad Mild to mod Ao insuff Htn Hyperlipidemia Tobacco use COPD Hep C  Significant Hospital Events: Including procedures, antibiotic start and stop dates in addition to other pertinent events   Admitted 10/16 for RUL Lobectomy Transferred to ICU 10/21   Interim History / Subjective:    Objective    Blood pressure 105/60, pulse 61, temperature 97.7 F (36.5 C), temperature source Oral, resp. rate 10, height 6' 3 (1.905 m), weight 116.7 kg, SpO2 91%.    FiO2 (%):  [80 %-100 %] 80 %   Intake/Output Summary (Last 24 hours) at 05/15/2024 1011 Last data filed at 05/15/2024 0600 Gross per 24 hour  Intake 1025.25 ml  Output 1355 ml  Net -329.75 ml   Filed Weights   05/11/24 0610 05/12/24 0359 05/14/24 0215  Weight: 118.7 kg 115.4 kg 116.7 kg    Examination: General: Elderly male, anxious on HHFNC HENT: NCAT,PEARL Lungs: Symmetric chest wall movement, crackles bilaterally Cardiovascular: Regular rate and rhythm, no murmurs or gallops Abdomen: Soft nontender nondistended Extremities: No edema Neuro: No focal deficit, baseline tremor GU: Deferred  Resolved problem list   Assessment and Plan   63 year old male who underwent 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  Patient has been on diuretics, steroids, DuoNeb, and HHFNC Patient is also anxious exacerbating his hypoxia.  Sats are better when asleep> 95%, but in the high 80s when awake. We have adjusted meds to include: Seroquel  200 mg at bedtime, Yupelr daily, Brovana nebs BID, budesonide  BID, cut down Solu-Medrol  to 40 daily, Continue with: Xanax 0.25 mg PRN q8hrs, Atarax  10 mg TID,PRN, Depakote  250 mg AM, 500 mg with lunch and 1000 mg qhsz, duloxetine  30 mg  TID, gabapentin  600 mg daily & 1200 mg qhs LASIX 40 mg twice daily, DuoNeb Q4hrs, subacute Lovenox  40 mg, fentanyl  25 mcg q2hrs prn   Acute metabolic encephalopathy ICU delirium, in the setting of patient with a history of anxiety, and schizoaffective disorder bipolar type Acute on chronic hypoxic respiratory failure Acute COPD exacerbation Acute pulmonary edema Adeno CA of the lung s/p RUL lobectomy Persistent right pneumothorax s/p chest tube placement   Plan Continue heated high flow nasal cannula wean as tolerated to maintain sats> 90% Continue nebs, reducing steroids to 40 mg Solu-Medrol  Continue Lasix 40 mg twice daily Adjusted anxiety and psych meds, brought on board psychiatry team to help with optimization of the meds Strict I&O's Monitor chest tube output Monitor and replace electrolytes      Labs   CBC: Recent Labs  Lab 05/10/24 0500 05/11/24 0347 05/12/24 0357 05/14/24 0550 05/15/24 0315  WBC 11.0* 8.2 8.1 9.1 10.6*  HGB 11.7* 10.2* 10.8* 10.8* 10.0*  HCT 33.3* 29.8* 31.6* 30.5* 28.5*  MCV 94.9 97.1 97.2 94.1 95.3  PLT 131* 114* 127* 161 163  Basic Metabolic Panel: Recent Labs  Lab 05/10/24 0500 05/11/24 0347 05/12/24 0357 05/14/24 0550 05/15/24 0315  NA 133* 134* 134* 130* 129*  K 4.0 3.6 4.0 4.0 4.5  CL 100 99 101 96* 95*  CO2 24 28 26 24 24   GLUCOSE 127* 122* 109* 127* 133*  BUN 15 17 13 17  31*  CREATININE 1.09 1.19 0.93 1.03 1.23  CALCIUM  8.2* 7.8* 7.9* 8.1* 7.8*  MG  --   --    --  1.8  --    GFR: Estimated Creatinine Clearance: 84.7 mL/min (by C-G formula based on SCr of 1.23 mg/dL). Recent Labs  Lab 05/11/24 0347 05/12/24 0357 05/14/24 0550 05/14/24 0809 05/15/24 0315  PROCALCITON  --   --   --  0.41  --   WBC 8.2 8.1 9.1  --  10.6*    Liver Function Tests: Recent Labs  Lab 05/11/24 0347  AST 24  ALT 11  ALKPHOS 35*  BILITOT 0.5  PROT 5.3*  ALBUMIN  2.9*   No results for input(s): LIPASE, AMYLASE in the last 168 hours. No results for input(s): AMMONIA in the last 168 hours.  ABG    Component Value Date/Time   PHART 7.39 05/14/2024 0034   PCO2ART 39 05/14/2024 0034   PO2ART 54 (L) 05/14/2024 0034   HCO3 23.6 05/14/2024 0034   TCO2 28 05/30/2023 1229   ACIDBASEDEF 1.2 05/14/2024 0034   O2SAT 85.8 05/14/2024 0034     Coagulation Profile: No results for input(s): INR, PROTIME in the last 168 hours.  Cardiac Enzymes: No results for input(s): CKTOTAL, CKMB, CKMBINDEX, TROPONINI in the last 168 hours.  HbA1C: Hgb A1c MFr Bld  Date/Time Value Ref Range Status  02/12/2024 08:40 AM 5.3 4.8 - 5.6 % Final    Comment:             Prediabetes: 5.7 - 6.4          Diabetes: >6.4          Glycemic control for adults with diabetes: <7.0   04/26/2022 03:30 PM 5.2 4.8 - 5.6 % Final    Comment:    (NOTE) Pre diabetes:          5.7%-6.4%  Diabetes:              >6.4%  Glycemic control for   <7.0% adults with diabetes     CBG: Recent Labs  Lab 05/14/24 1127 05/14/24 1535 05/14/24 2105 05/15/24 0746  GLUCAP 124* 167* 130* 139*    Review of Systems:   Negative except above  Past Medical History:  He,  has a past medical history of Anginal pain, Anxiety, Arthritis, Bipolar 1 disorder (HCC), Bursitis, CAD (coronary artery disease), Cancer (HCC), Chronic pain, COPD (chronic obstructive pulmonary disease) (HCC), Current use of long term anticoagulation, Depression, Diverticulitis, Dyspnea, GERD (gastroesophageal reflux  disease), Grade I diastolic dysfunction, Hepatitis C (2012), HLD (hyperlipidemia), Hypertension, MI (myocardial infarction) (HCC), Polysubstance abuse (HCC), PUD (peptic ulcer disease), S/P angioplasty with stent (06/10/2016), S/P PTCA (percutaneous transluminal coronary angioplasty) (12/04/2019), Schizophrenia (HCC), Stroke (HCC), Tremors, and Valvular insufficiency.   Surgical History:   Past Surgical History:  Procedure Laterality Date   ABDOMINAL SURGERY     removed small piece of intestines due to [facility] Diverticulosis   ANTERIOR LAT LUMBAR FUSION N/A 08/23/2022   Procedure: DIRECT LATERAL INTERBODY FUSION  LUMBAR TWO- LUMBAR THREE, LUMBAR THREE-LUMBAR FOUR , EXPLORE AND EXTEND FUSION LUMBAR TWO-LUMBAR FIVE, POSTERIOR DECOMPRESSION LUMBAR THREE-LUMBAR FOUR, LEFT LUMBAR  TWO-LUMBAR THREE;  Surgeon: Debby Dorn MATSU, MD;  Location: [facility] OR;  Service: Neurosurgery;  Laterality: N/A;   APPENDECTOMY     BACK SURGERY     CARDIAC CATHETERIZATION Left 06/10/2016   Procedure: Left Heart Cath and Coronary Angiography;  Surgeon: Denyse DELENA Bathe, MD;  Location: [facility] INVASIVE CV LAB;  Service: Cardiovascular;  Laterality: Left;   CARDIAC CATHETERIZATION N/A 06/10/2016   Procedure: Coronary Stent Intervention;  Surgeon: Cara JONETTA Lovelace, MD;  Location: [facility] INVASIVE CV LAB;  Service: Cardiovascular;  Laterality: N/A;   CHOLECYSTECTOMY N/A 02/17/2022   Procedure: LAPAROSCOPIC CHOLECYSTECTOMY;  Surgeon: Stevie Herlene Righter, MD;  Location: [facility] OR;  Service: General;  Laterality: N/A;   COLON SURGERY     COLONOSCOPY     COLONOSCOPY WITH PROPOFOL  N/A 01/05/2017   Procedure: COLONOSCOPY WITH PROPOFOL ;  Surgeon: Therisa Bi, MD;  Location: [facility] ENDOSCOPY;  Service: Endoscopy;  Laterality: N/A;   COLONOSCOPY WITH PROPOFOL  N/A 02/13/2020   Procedure: COLONOSCOPY WITH PROPOFOL ;  Surgeon: Janalyn Keene NOVAK, MD;  Location: [facility] ENDOSCOPY;  Service: Endoscopy;  Laterality: N/A;   CORONARY ANGIOPLASTY WITH STENT  PLACEMENT     CORONARY PRESSURE/FFR STUDY N/A 12/04/2019   Procedure: INTRAVASCULAR PRESSURE WIRE/FFR STUDY;  Surgeon: Wonda Sharper, MD;  Location: [facility] INVASIVE CV LAB;  Service: Cardiovascular;  Laterality: N/A;   ESOPHAGOGASTRODUODENOSCOPY (EGD) WITH PROPOFOL  N/A 01/05/2017   Procedure: ESOPHAGOGASTRODUODENOSCOPY (EGD) WITH PROPOFOL ;  Surgeon: Therisa Bi, MD;  Location: [facility] ENDOSCOPY;  Service: Endoscopy;  Laterality: N/A;   ESOPHAGOGASTRODUODENOSCOPY (EGD) WITH PROPOFOL  N/A 02/13/2020   Procedure: ESOPHAGOGASTRODUODENOSCOPY (EGD) WITH PROPOFOL ;  Surgeon: Janalyn Keene NOVAK, MD;  Location: [facility] ENDOSCOPY;  Service: Endoscopy;  Laterality: N/A;   INTERCOSTAL NERVE BLOCK  05/09/2024   Procedure: BLOCK, NERVE, INTERCOSTAL;  Surgeon: Kerrin Elspeth BROCKS, MD;  Location: [facility] OR;  Service: Thoracic;;   KNEE ARTHROSCOPY WITH MEDIAL MENISECTOMY Right 09/05/2017   Procedure: KNEE ARTHROSCOPY WITH MEDIAL AND LATERAL  MENISECTOMY PARTIAL SYNOVECTOMY;  Surgeon: Kathlynn Sharper, MD;  Location: [facility] ORS;  Service: Orthopedics;  Laterality: Right;   LEFT HEART CATH AND CORONARY ANGIOGRAPHY N/A 12/04/2019   Procedure: LEFT HEART CATH AND CORONARY ANGIOGRAPHY;  Surgeon: Wonda Sharper, MD;  Location: [facility] INVASIVE CV LAB;  Service: Cardiovascular;  Laterality: N/A;   LEFT HEART CATH AND CORONARY ANGIOGRAPHY Left 11/25/2022   Procedure: LEFT HEART CATH AND CORONARY ANGIOGRAPHY;  Surgeon: Bathe Denyse DELENA, MD;  Location: [facility] INVASIVE CV LAB;  Service: Cardiovascular;  Laterality: Left;   LOBECTOMY, LUNG, ROBOT-ASSISTED, USING VATS Right 05/09/2024   Procedure: LOBECTOMY, LUNG RIGHT UPPER LOBE, ROBOT-ASSISTED, USING VATS;  Surgeon: Kerrin Elspeth BROCKS, MD;  Location: [facility] OR;  Service: Thoracic;  Laterality: Right;   LYMPH NODE BIOPSY  05/09/2024   Procedure: LYMPH NODE BIOPSY;  Surgeon: Kerrin Elspeth BROCKS, MD;  Location: [facility] OR;  Service: Thoracic;;   REVERSE SHOULDER ARTHROPLASTY Right 08/31/2023   Procedure:  RIGHT REVERSE SHOULDER ARTHROPLASTY;  Surgeon: [name], MD;  Location: [facility] OR;  Service: Orthopedics;  Laterality: Right;   SHOULDER SURGERY Right 04/09/2012   SPINE SURGERY     TOTAL KNEE ARTHROPLASTY Right 01/19/2021   Procedure: TOTAL KNEE ARTHROPLASTY - [place]ford Amber to Assist;  Surgeon: Kathlynn Sharper, MD;  Location: [facility] ORS;  Service: Orthopedics;  Laterality: Right;   WEDGE RESECTION, LUNG, ROBOT-ASSISTED, THORACOSCOPIC Right 05/09/2024   Procedure: WEDGE RESECTION, LUNG RIGHT UPPER LOBE, ROBOT-ASSISTED, THORACOSCOPIC;  Surgeon: Kerrin Elspeth BROCKS, MD;  Location: [facility] OR;  Service: Thoracic;  Laterality: Right;  Social History:   reports that he quit smoking about 25 years ago. His smoking use included cigarettes. He started smoking about 45 years ago. He has a 63.8 pack-year smoking history. He has been exposed to tobacco smoke. He has never used smokeless tobacco. He reports that he does not currently use alcohol  after a past usage of about 3.0 standard drinks of alcohol  per week. He reports that he does not currently use drugs after having used the following drugs: Cocaine and Marijuana.   Family History:  His family history includes Depression in his mother; Early death in his father; Heart attack in his father; Heart disease in his father and mother; Hypertension in his father, mother, and sister; Osteoarthritis in his mother; Parkinson's disease in his maternal grandfather. There is no history of Prostate cancer, Bladder Cancer, Kidney cancer, or Tremor.   Allergies Allergies  Allergen Reactions   Asenapine Other (See Comments) and Nausea And Vomiting    Increased tremors   Dextromethorphan Hbr    Guaifenesin     Latuda [Lurasidone Hcl] Other (See Comments)    Tremors     Lurasidone Other (See Comments) and Hives    Other Reaction(s): Angioedema   Phenylephrine       Home Medications  Prior to Admission medications   Medication Sig Start Date End Date Taking?  Authorizing Provider  acetaminophen  (TYLENOL ) 325 MG tablet Take 1 tablet (325 mg total) by mouth every 6 (six) hours as needed for mild pain (pain score 1-3) (or temp > 100.5). 09/01/23  Yes [name], MD  albuterol  (VENTOLIN  HFA) 108 (90 Base) MCG/ACT inhaler Inhale 2 puffs into the lungs every 6 (six) hours as needed for wheezing or shortness of breath. 12/19/23  Yes Lorren Greig PARAS, NP  aspirin  EC 81 MG tablet Take 1 tablet (81 mg total) by mouth daily. Swallow whole. 02/03/23  Yes Evelena, Nadir, MD  atorvastatin  (LIPITOR ) 80 MG tablet TAKE 1 TABLET BY MOUTH EVERY DAY 01/15/24  Yes [name], MD  budesonide -glycopyrrolate -formoterol  (BREZTRI  AEROSPHERE) 160-9-4.8 MCG/ACT AERO inhaler Inhale 2 puffs into the lungs in the morning and at bedtime. 12/19/23  Yes Lorren, Amy J, NP  celecoxib  (CELEBREX ) 100 MG capsule TAKE 2 CAPSULES BY MOUTH TWICE A DAY 04/25/24  Yes Georgina Ozell LABOR, MD  divalproex  (DEPAKOTE ) 500 MG DR tablet Take half tablet in the morning and 3 tablets at bedtime Patient taking differently: Take half tablet in the morning, 1 in the afternoon, and 2 tablets at bedtime 04/25/24  Yes Izella Ismael NOVAK, MD  DULoxetine  (CYMBALTA ) 30 MG capsule Take 3 capsules (90 mg total) by mouth daily. Patient taking differently: Take 30 mg by mouth 3 (three) times daily. 04/25/24 06/24/24 Yes Izella Ismael NOVAK, MD  gabapentin  (NEURONTIN ) 600 MG tablet Take 1 tablet (600 mg total) by mouth daily AND 2 tablets (1,200 mg total) at bedtime. 04/25/24  Yes Izella Ismael NOVAK, MD  hydrOXYzine  (ATARAX ) 25 MG tablet Take 1 tablet (25 mg total) by mouth 2 (two) times daily as needed. Patient taking differently: Take 25 mg by mouth daily. 04/25/24  Yes Izella Ismael NOVAK, MD  isosorbide  mononitrate (IMDUR ) 30 MG 24 hr tablet TAKE 1 TABLET BY MOUTH 2 TIMES DAILY. 04/09/24  Yes Fernand Denyse LABOR, MD  Multiple Vitamins-Minerals (MENS 50+ MULTIVITAMIN) TABS Take 1 tablet by mouth in the morning.   Yes [provider]  nicotine  polacrilex (NICOTINE  MINI) 2 MG lozenge Take 1 lozenge (2 mg total) by mouth every 2 (  two) hours as needed for smoking cessation. 03/26/24 06/24/24 Yes Dgayli, Belva, MD  nitroGLYCERIN  (NITROSTAT ) 0.4 MG SL tablet Place 1 tablet (0.4 mg total) under the tongue every 5 (five) minutes as needed for chest pain. 02/03/23 05/06/24 Yes Evelena Figures, MD  pantoprazole  (PROTONIX ) 40 MG tablet Take 1 tablet (40 mg total) by mouth daily. 12/19/23  Yes Lorren, Amy J, NP  QUEtiapine  (SEROQUEL ) 200 MG tablet Take 1 tablet (200 mg total) by mouth at bedtime. 04/25/24  Yes Izella Ismael NOVAK, MD  traZODone  (DESYREL ) 100 MG tablet Take 0.5 tablets (50 mg total) by mouth at bedtime for 7 days. Patient not taking: Reported on 05/06/2024 04/25/24 05/02/24  Izella Ismael NOVAK, MD           Lenny Drought, MD  [facility] Prefer epic messenger for cross cover needs   My critical care time: 40 minutes  Critical care time was exclusive of separately billable procedures and treating other patients.  Critical care was necessary to treat or prevent imminent or life-threatening deterioration.  Critical care was time spent personally by me on the following activities: development of treatment plan with patient and/or surrogate as well as nursing, discussions with consultants, evaluation of patient's response to treatment, examination of patient, obtaining history from patient or surrogate, ordering and performing treatments and interventions, ordering and review of laboratory studies, ordering and review of radiographic studies, pulse oximetry, re-evaluation of patient's condition and participation in multidisciplinary rounds.   05/15/2024, 10:50 AM

## 2024-05-16 ENCOUNTER — Inpatient Hospital Stay (HOSPITAL_COMMUNITY): Payer: MEDICAID

## 2024-05-16 DIAGNOSIS — J810 Acute pulmonary edema: Secondary | ICD-10-CM | POA: Diagnosis not present

## 2024-05-16 DIAGNOSIS — F411 Generalized anxiety disorder: Secondary | ICD-10-CM | POA: Diagnosis not present

## 2024-05-16 DIAGNOSIS — J449 Chronic obstructive pulmonary disease, unspecified: Secondary | ICD-10-CM | POA: Diagnosis not present

## 2024-05-16 DIAGNOSIS — G9341 Metabolic encephalopathy: Secondary | ICD-10-CM | POA: Diagnosis not present

## 2024-05-16 DIAGNOSIS — F430 Acute stress reaction: Secondary | ICD-10-CM | POA: Diagnosis not present

## 2024-05-16 DIAGNOSIS — J9621 Acute and chronic respiratory failure with hypoxia: Secondary | ICD-10-CM | POA: Diagnosis not present

## 2024-05-16 DIAGNOSIS — F4329 Adjustment disorder with other symptoms: Secondary | ICD-10-CM | POA: Diagnosis not present

## 2024-05-16 LAB — GLUCOSE, CAPILLARY
Glucose-Capillary: 134 mg/dL — ABNORMAL HIGH (ref 70–99)
Glucose-Capillary: 132 mg/dL — ABNORMAL HIGH (ref 70–99)
Glucose-Capillary: 121 mg/dL — ABNORMAL HIGH (ref 70–99)
Glucose-Capillary: 123 mg/dL — ABNORMAL HIGH (ref 70–99)

## 2024-05-16 LAB — CBC
HCT: 33.1 % — ABNORMAL LOW (ref 39.0–52.0)
Hemoglobin: 11 g/dL — ABNORMAL LOW (ref 13.0–17.0)
MCH: 33.2 pg (ref 26.0–34.0)
MCHC: 33.2 g/dL (ref 30.0–36.0)
MCV: 100 fL (ref 80.0–100.0)
Platelets: 180 K/uL (ref 150–400)
RBC: 3.31 MIL/uL — ABNORMAL LOW (ref 4.22–5.81)
RDW: 14.1 % (ref 11.5–15.5)
WBC: 7.4 K/uL (ref 4.0–10.5)
nRBC: 0 % (ref 0.0–0.2)

## 2024-05-16 LAB — BASIC METABOLIC PANEL WITH GFR
Anion gap: 14 (ref 5–15)
BUN: 23 mg/dL (ref 8–23)
CO2: 24 mmol/L (ref 22–32)
Calcium: 8.2 mg/dL — ABNORMAL LOW (ref 8.9–10.3)
Chloride: 93 mmol/L — ABNORMAL LOW (ref 98–111)
Creatinine, Ser: 0.91 mg/dL (ref 0.61–1.24)
GFR, Estimated: >60 mL/min (ref >60)
Glucose, Bld: 102 mg/dL — ABNORMAL HIGH (ref 70–99)
Potassium: 4.1 mmol/L (ref 3.5–5.1)
Sodium: 131 mmol/L — ABNORMAL LOW (ref 135–145)

## 2024-05-16 MED ORDER — OXYCODONE HCL 5 MG PO TABS
5.0000 mg | ORAL_TABLET | ORAL | Status: DC | PRN
  Administered 2024-05-16 – 2024-05-18 (×8): 10 mg via ORAL
  Filled 2024-05-16 (×8): qty 2

## 2024-05-16 MED ORDER — METHYLPREDNISOLONE SODIUM SUCC 40 MG IJ SOLR
20.0000 mg | Freq: Once | INTRAMUSCULAR | Status: AC
  Administered 2024-05-16: 20 mg via INTRAVENOUS

## 2024-05-16 NOTE — Plan of Care (Signed)
  Problem: Clinical Measurements: Goal: Respiratory complications will improve Outcome: Not Progressing   Problem: Clinical Measurements: Goal: Cardiovascular complication will be avoided Outcome: Progressing   Problem: Clinical Measurements: Goal: Diagnostic test results will improve Outcome: Progressing   Problem: Activity: Goal: Risk for activity intolerance will decrease Outcome: Progressing

## 2024-05-16 NOTE — TOC Progression Note (Signed)
 Transition of Care [facility]) - Progression Note    Patient Details  Name: Keith Gibbs MRN: [id number] Date of Birth: 1961/01/10  Transition of Care [facility]) CM/SW Contact  Justina Delcia Czar, RN Phone Number: [phone] 05/16/2024, 1:10 PM  Clinical Narrative:    Spoke to pt and Mom at bedside. Mother takes to appt. Pt will need oxygen for home.  Will need PT/OT evaluation and recommendations, may need IP rehab vs HH.   Chart reviewed for discharge readiness, patient not medically stable for d/c. Inpatient CM/CSW will continue to monitor pt's advancement through interdisciplinary progression rounds.   If new pt transition needs arise, MD please place a TOC consult.     Expected Discharge Plan: Home w Home Health Services Barriers to Discharge: Continued Medical Work up    Expected Discharge Plan and Services   Discharge Planning Services: CM Consult   Living arrangements for the past 2 months: Single Family Home                    Social Drivers of Health (SDOH) Interventions SDOH Screenings   Food Insecurity: No Food Insecurity (05/09/2024)  Recent Concern: Food Insecurity - Food Insecurity Present (04/16/2024)  Housing: Unknown (05/10/2024)  Recent Concern: Housing - High Risk (04/16/2024)  Transportation Needs: No Transportation Needs (05/09/2024)  Utilities: Not At Risk (05/09/2024)  Alcohol  Screen: Low Risk  (01/29/2023)  Depression (PHQ2-9): Medium Risk (04/05/2024)  Financial Resource Strain: High Risk (04/16/2024)  Physical Activity: Inactive (04/16/2024)  Social Connections: Moderately Integrated (05/10/2024)  Stress: Stress Concern Present (04/16/2024)  Tobacco Use: Medium Risk (05/09/2024)  Health Literacy: Adequate Health Literacy (12/19/2023)    Readmission Risk Interventions    03/01/2022   11:27 AM  Readmission Risk Prevention Plan  Transportation Screening Complete  HRI or Home Care Consult Complete  Social Work Consult for Recovery Care  Planning/Counseling Complete  Palliative Care Screening Not Applicable  Medication Review Oceanographer) Complete

## 2024-05-16 NOTE — Consult Note (Addendum)
 [facility] Psychiatric Consult Initial  Patient Name: .Keith Gibbs  MRN: [id number]  DOB: 10/17/1960  Consult Order details:  Orders (From admission, onward)     Start     Ordered   05/15/24 0927  IP CONSULT TO PSYCHIATRY       Ordering Provider: Sharie Bourbon, MD  Provider:  (Not yet assigned)  Question Answer Comment  Location [facility]   Reason for Consult? anxious with hx of schizoaffective disorder bipolar type, needs optimization of psych meds      05/15/24 0929             Mode of Visit: In person    Psychiatry Consult Evaluation  Service Date: May 16, 2024 LOS:  LOS: 7 days  Chief Complaint anxiety with hx of schizoaffective disorder bipolar type, needs optimization of psych meds.   Primary Psychiatric Diagnoses  GAD 2.  Acute stress reaction 3.  Adjustment disorder  Assessment  Keith Gibbs is a 63 y.o. male admitted: Medicallyfor 05/09/2024 10:27 AM for lobectomy in the setting of lung cancer. He carries the psychiatric diagnoses of PTSD, MDD and has a past medical history of  COPD and lung cancer.  His current presentation of heightened anxiety, restlessness, and concern related to new medical diagnoses and oxygen dependency is most consistent with an Adjustment Disorder with Anxiety. He meets criteria for this diagnosis based on the presence of identifiable stressors (recent lobectomy, cancer diagnosis, and oxygen dependence) leading to emotional distress and anxiety that exceed expected adaptive responses, without evidence of a primary anxiety or mood disorder exacerbation.  Current outpatient psychotropic medications include Seroquel , Depakote , Cymbalta , Gabapentin , and Hydroxyzine , and historically he has had a good response to this regimen. He was compliant with medications prior to admission as evidenced by CDIOP documentation, recent clinic visits, and stable outpatient progress.  Patient is alert and oriented 4, calm,  and cooperative. Speech normal in rate and tone. Mood described as "better," affect congruent. Thought process linear and goal-directed. No psychosis or acute distress noted.  Chart review shows his psychotropic medications were resumed at incorrect dosages, which have since been corrected. He is currently prescribed Seroquel  200 mg PO at bedtime, Depakote  250 mg PO daily and 1500 mg PO at bedtime, Cymbalta  90 mg PO daily, Gabapentin  600 mg PO daily and 1200 mg at bedtime, and Hydroxyzine  25 mg PO BID PRN. He is also receiving methylprednisolone , which can contribute to anxiety or manic symptoms.  Diagnoses:  Active Hospital problems: Principal Problem:   S/P lobectomy of lung Active Problems:   Status post robot-assisted surgical procedure   Acute hypoxic respiratory failure (HCC)   Anxiety disorder due to medical condition    Plan   ## Psychiatric Medication Recommendations:  Continue Seroquel  200 mg PO QHS, Depakote  250 mg PO daily and 1500 mg PO QHS, Cymbalta  90 mg PO daily, Gabapentin  600 mg PO daily and 1200 mg QHS, and Hydroxyzine  25 mg PO BID PRN as ordered.  Continue Seroquel  25 mg PO BID PRN (8 AM and 4 PM) to target situational anxiety and potential steroid-related agitation or hypomania, though none currently observed.  Continue PRN Hydroxyzine  for breakthrough anxiety as needed.  Reinforce continued sobriety and CDIOP follow-up post-discharge.  ## Medical Decision Making Capacity: Not specifically addressed in this encounter  ## Further Work-up:  -- No UDS obtained on admission(too late). Will add on TSH, B1, B12.  TSH, B12, folate, EKG, While pt on Qtc prolonging medications, please monitor &  replete K+ to 4 and Mg2+ to 2, TOC consult for substance abuse resources, U/A, or UDS -- most recent EKG on 10/21 had QtC of 428 -- Pertinent labwork reviewed earlier this admission includes: VPA level-52, WBC 9.1> 10.6, hgb 10.8<10.0,Troponin 58>47, hyponatremia 129, magnesium  1.8>  2.6   ## Disposition:-- There are no psychiatric contraindications to discharge at this time  ## Behavioral / Environmental: - No specific recommendations at this time.     ## Safety and Observation Level:  - Based on my clinical evaluation, I estimate the patient to be at low risk of self harm in the current setting. - At this time, we recommend  routine. This decision is based on my review of the chart including patient's history and current presentation, interview of the patient, mental status examination, and consideration of suicide risk including evaluating suicidal ideation, plan, intent, suicidal or self-harm behaviors, risk factors, and protective factors. This judgment is based on our ability to directly address suicide risk, implement suicide prevention strategies, and develop a safety plan while the patient is in the clinical setting. Please contact our team if there is a concern that risk level has changed.  CSSR Risk Category:C-SSRS RISK CATEGORY: No Risk  Suicide Risk Assessment: Patient has following modifiable risk factors for suicide: current symptoms: anxiety/panic, insomnia, impulsivity, anhedonia, hopelessness, triggering events, recent psychiatric hospitalization, and recent loss (death, isolation, vocation), which we are addressing by medication management, close inpatient follow-up.  Patient has following non-modifiable or demographic risk factors for suicide: male gender, history of suicide attempt, history of self harm behavior, psychiatric hospitalization, and family h/o suicide Patient has the following protective factors against suicide: Access to outpatient mental health care, Supportive family, Supportive friends, Cultural, spiritual, or religious beliefs that discourage suicide, and Minor children in the home  Thank you for this consult request. Recommendations have been communicated to the primary team.  We will sign off at this time.   Majel GORMAN Ramp,  FNP       History of Present Illness  Relevant Aspects of [facility] Course:  Admitted on 05/09/2024 for male presented for lobectomy with CTS. Progressing as expected post operatively with CT in place and was on 4E when this morning he decompensated with worsening hypoxemia and increased wob. CTS transferred to ICU and requested CCM evaluation.   Patient Report:  Patient seen and evaluated today for follow-up. Reports improvement in sleep, stating he was able to rest once he finally fell asleep. Continues to endorse elevated anxiety and some difficulty adjusting, though overall appears encouraged by getting some rest. Appetite is fair but reports not being very hungry. Denies suicidal ideation. Inquired about pain medication and expressed interest in following up with outpatient psychiatry upon discharge.  Psych ROS:  Depression: Yes sadness, isolation Anxiety:  Tremors, fear, excessive worrying Mania (lifetime and current): Denies Psychosis: (lifetime and current): Denies  Collateral information:  Mom not available.   Review of Systems  Respiratory:  Positive for shortness of breath.   Psychiatric/Behavioral:  Positive for depression. The patient is nervous/anxious and has insomnia.   All other systems reviewed and are negative.    Psychiatric and Social History  Psychiatric History:  Information collected from patient and chart review  Prev Dx/Sx: MDD, GAD, PTSD Current Psych Provider: Dr. Daniela Hoang Home Meds (current): Seroquel  200 mg PO QHS, Depakote  250 mg PO daily and 1500 mg PO QHS, Cymbalta  90 mg PO daily, Gabapentin  600 mg PO daily and 1200 mg QHS, and  Hydroxyzine  25 mg PO BID PRN Previous Med Trials: Unable to recall Therapy: CDIOP at [facility]  Prior Psych Hospitalization: Yes 2x  Prior Self Harm: Denies Prior Violence: Denies  Family Psych History: Yes his paternal uncle and paternal grandmother Family Hx suicide: Yes paternal uncle and grandmother  Social  History:  Developmental Hx: WNL Educational Hx: High school Occupational Hx: Unemployed Armed Forces Operational Officer Hx: Denies Living Situation: Lives with his mother Spiritual Hx: [employer] Access to weapons/lethal means: Denies   Substance History Alcohol : Denies at this time  Type of alcohol : noen Last Drink 01/2024 Number of drinks per day n/a History of alcohol  withdrawal seizures n/a History of DT's N/a Tobacco: Yes 1/2 ppd Illicit drugs: Denies Prescription drug abuse: Denies Rehab hx: Denies  Exam Findings  Physical Exam: Appears older than age stated Vital Signs:  Temp:  [98.5 F (36.9 C)-98.7 F (37.1 C)] 98.5 F (36.9 C) (10/23 1120) Pulse Rate:  [64-90] 77 (10/23 1100) Resp:  [12-21] 17 (10/23 1100) BP: (93-153)/(52-139) 129/69 (10/23 1100) SpO2:  [80 %-98 %] 90 % (10/23 1114) FiO2 (%):  [80 %-90 %] 80 % (10/23 1525) Weight:  [113.4 kg] 113.4 kg (10/23 0600) Blood pressure 129/69, pulse 77, temperature 98.5 F (36.9 C), temperature source Oral, resp. rate 17, height 6' 3 (1.905 m), weight 113.4 kg, SpO2 90%. Body mass index is 31.25 kg/m.  Physical Exam Vitals and nursing note reviewed.  Constitutional:      Appearance: Normal appearance. He is normal weight.  Skin:    Capillary Refill: Capillary refill takes less than 2 seconds.  Neurological:     General: No focal deficit present.     Mental Status: He is alert and oriented to person, place, and time. Mental status is at baseline.  Psychiatric:        Attention and Perception: He is attentive. He does not perceive auditory or visual hallucinations.        Mood and Affect: Mood is anxious and depressed.        Speech: Speech normal.        Behavior: Behavior normal. Behavior is cooperative.        Thought Content: Thought content normal.        Cognition and Memory: Cognition and memory normal.        Judgment: Judgment normal.     Mental Status Exam: General Appearance: Disheveled  Orientation:  Full (Time, Place,  and Person)  Memory:  Immediate;   Fair Recent;   Fair  Concentration:  Concentration: Fair and Attention Span: Good  Recall:  Good  Attention  Fair  Eye Contact:  Good  Speech:  Clear and Coherent and Normal Rate  Language:  Good  Volume:  Normal  Mood: Anxious  Affect:  Appropriate and Congruent  Thought Process:  Coherent, Goal Directed, and Linear  Thought Content:  Logical  Suicidal Thoughts:  No  Homicidal Thoughts:  No  Judgement:  Intact  Insight:  Present  Psychomotor Activity:  Normal  Akathisia:  Yes  Fund of Knowledge:  Fair   Assets:  Manufacturing Systems Engineer Desire for Improvement Financial Resources/Insurance Housing Intimacy Physical Health Resilience Social Support  Cognition:  WNL  ADL's:  Intact  AIMS (if indicated):        Other History   These have been pulled in through the EMR, reviewed, and updated if appropriate.  Family History:  The patient's family history includes Depression in his mother; Early death in his father; Heart attack in his  father; Heart disease in his father and mother; Hypertension in his father, mother, and sister; Osteoarthritis in his mother; Parkinson's disease in his maternal grandfather.  Medical History: Past Medical History:  Diagnosis Date   Anginal pain    Anxiety    Arthritis    Bipolar 1 disorder (HCC)    Bursitis    CAD (coronary artery disease)    Stent placed 2017   Cancer (HCC)    Chronic pain    COPD (chronic obstructive pulmonary disease) (HCC)    Current use of long term anticoagulation    DAPT (ASA + clopidogrel )   Depression    Diverticulitis    Dyspnea    GERD (gastroesophageal reflux disease)    Grade I diastolic dysfunction    Hepatitis C 2012   No longer has Hep C   HLD (hyperlipidemia)    Hypertension    MI (myocardial infarction) (HCC)    Polysubstance abuse (HCC)    cocaine, marijuana, ETOH   PUD (peptic ulcer disease)    S/P angioplasty with stent 06/10/2016   a.) 90% stenosis of  pLAD to mLAD - 2.5 x 18 mm Xience Alpine (DES x 1) placed to pLAD   S/P PTCA (percutaneous transluminal coronary angioplasty) 12/04/2019   a.) 60% in stent restenosis of DES to pLAD; LVEF 65%.   Schizophrenia (HCC)    Stroke (HCC)    Tremors    generalized   Valvular insufficiency    a.) Mild MR, TR, PR; mild to moderate AR on 03/05/2018 TTE    Surgical History: Past Surgical History:  Procedure Laterality Date   ABDOMINAL SURGERY     removed small piece of intestines due to [facility] Diverticulosis   ANTERIOR LAT LUMBAR FUSION N/A 08/23/2022   Procedure: DIRECT LATERAL INTERBODY FUSION  LUMBAR TWO- LUMBAR THREE, LUMBAR THREE-LUMBAR FOUR , EXPLORE AND EXTEND FUSION LUMBAR TWO-LUMBAR FIVE, POSTERIOR DECOMPRESSION LUMBAR THREE-LUMBAR FOUR, LEFT LUMBAR TWO-LUMBAR THREE;  Surgeon: Debby Dorn MATSU, MD;  Location: [facility] OR;  Service: Neurosurgery;  Laterality: N/A;   APPENDECTOMY     BACK SURGERY     CARDIAC CATHETERIZATION Left 06/10/2016   Procedure: Left Heart Cath and Coronary Angiography;  Surgeon: Denyse DELENA Bathe, MD;  Location: [facility] INVASIVE CV LAB;  Service: Cardiovascular;  Laterality: Left;   CARDIAC CATHETERIZATION N/A 06/10/2016   Procedure: Coronary Stent Intervention;  Surgeon: Cara JONETTA Lovelace, MD;  Location: [facility] INVASIVE CV LAB;  Service: Cardiovascular;  Laterality: N/A;   CHOLECYSTECTOMY N/A 02/17/2022   Procedure: LAPAROSCOPIC CHOLECYSTECTOMY;  Surgeon: Stevie Herlene Righter, MD;  Location: [facility] OR;  Service: General;  Laterality: N/A;   COLON SURGERY     COLONOSCOPY     COLONOSCOPY WITH PROPOFOL  N/A 01/05/2017   Procedure: COLONOSCOPY WITH PROPOFOL ;  Surgeon: Therisa Bi, MD;  Location: [facility] ENDOSCOPY;  Service: Endoscopy;  Laterality: N/A;   COLONOSCOPY WITH PROPOFOL  N/A 02/13/2020   Procedure: COLONOSCOPY WITH PROPOFOL ;  Surgeon: Janalyn Keene NOVAK, MD;  Location: [facility] ENDOSCOPY;  Service: Endoscopy;  Laterality: N/A;   CORONARY ANGIOPLASTY WITH STENT PLACEMENT      CORONARY PRESSURE/FFR STUDY N/A 12/04/2019   Procedure: INTRAVASCULAR PRESSURE WIRE/FFR STUDY;  Surgeon: Wonda Sharper, MD;  Location: [facility] INVASIVE CV LAB;  Service: Cardiovascular;  Laterality: N/A;   ESOPHAGOGASTRODUODENOSCOPY (EGD) WITH PROPOFOL  N/A 01/05/2017   Procedure: ESOPHAGOGASTRODUODENOSCOPY (EGD) WITH PROPOFOL ;  Surgeon: Therisa Bi, MD;  Location: [facility] ENDOSCOPY;  Service: Endoscopy;  Laterality: N/A;   ESOPHAGOGASTRODUODENOSCOPY (EGD) WITH PROPOFOL  N/A 02/13/2020   Procedure: ESOPHAGOGASTRODUODENOSCOPY (  EGD) WITH PROPOFOL ;  Surgeon: Janalyn Keene NOVAK, MD;  Location: [facility] ENDOSCOPY;  Service: Endoscopy;  Laterality: N/A;   INTERCOSTAL NERVE BLOCK  05/09/2024   Procedure: BLOCK, NERVE, INTERCOSTAL;  Surgeon: Kerrin Elspeth BROCKS, MD;  Location: [facility] OR;  Service: Thoracic;;   KNEE ARTHROSCOPY WITH MEDIAL MENISECTOMY Right 09/05/2017   Procedure: KNEE ARTHROSCOPY WITH MEDIAL AND LATERAL  MENISECTOMY PARTIAL SYNOVECTOMY;  Surgeon: Kathlynn Sharper, MD;  Location: [facility] ORS;  Service: Orthopedics;  Laterality: Right;   LEFT HEART CATH AND CORONARY ANGIOGRAPHY N/A 12/04/2019   Procedure: LEFT HEART CATH AND CORONARY ANGIOGRAPHY;  Surgeon: Wonda Sharper, MD;  Location: [facility] INVASIVE CV LAB;  Service: Cardiovascular;  Laterality: N/A;   LEFT HEART CATH AND CORONARY ANGIOGRAPHY Left 11/25/2022   Procedure: LEFT HEART CATH AND CORONARY ANGIOGRAPHY;  Surgeon: Fernand Denyse LABOR, MD;  Location: [facility] INVASIVE CV LAB;  Service: Cardiovascular;  Laterality: Left;   LOBECTOMY, LUNG, ROBOT-ASSISTED, USING VATS Right 05/09/2024   Procedure: LOBECTOMY, LUNG RIGHT UPPER LOBE, ROBOT-ASSISTED, USING VATS;  Surgeon: Kerrin Elspeth BROCKS, MD;  Location: [facility] OR;  Service: Thoracic;  Laterality: Right;   LYMPH NODE BIOPSY  05/09/2024   Procedure: LYMPH NODE BIOPSY;  Surgeon: Kerrin Elspeth BROCKS, MD;  Location: [facility] OR;  Service: Thoracic;;   REVERSE SHOULDER ARTHROPLASTY Right 08/31/2023   Procedure: RIGHT REVERSE  SHOULDER ARTHROPLASTY;  Surgeon: [name], MD;  Location: [facility] OR;  Service: Orthopedics;  Laterality: Right;   SHOULDER SURGERY Right 04/09/2012   SPINE SURGERY     TOTAL KNEE ARTHROPLASTY Right 01/19/2021   Procedure: TOTAL KNEE ARTHROPLASTY - Medford Amber to Assist;  Surgeon: Kathlynn Sharper, MD;  Location: [facility] ORS;  Service: Orthopedics;  Laterality: Right;   WEDGE RESECTION, LUNG, ROBOT-ASSISTED, THORACOSCOPIC Right 05/09/2024   Procedure: WEDGE RESECTION, LUNG RIGHT UPPER LOBE, ROBOT-ASSISTED, THORACOSCOPIC;  Surgeon: Kerrin Elspeth BROCKS, MD;  Location: [facility] OR;  Service: Thoracic;  Laterality: Right;     Medications:   Current Facility-Administered Medications:    arformoterol (BROVANA) nebulizer solution 15 mcg, 15 mcg, Nebulization, BID, Hussein, Abdullahi, MD, 15 mcg at 05/16/24 0752   aspirin  EC tablet 81 mg, 81 mg, Oral, Daily, Barrett, Erin R, PA-C, 81 mg at 05/16/24 0905   atorvastatin  (LIPITOR ) tablet 80 mg, 80 mg, Oral, Daily, Barrett, Erin R, PA-C, 80 mg at 05/16/24 0857   budesonide  (PULMICORT ) nebulizer solution 0.25 mg, 0.25 mg, Nebulization, BID, Sharie Bourbon, MD, 0.25 mg at 05/16/24 9247   celecoxib  (CELEBREX ) capsule 200 mg, 200 mg, Oral, BID, Barrett, Erin R, PA-C, 200 mg at 05/16/24 0857   Chlorhexidine  Gluconate Cloth 2 % PADS 6 each, 6 each, Topical, Daily, Hendrickson, Steven C, MD, 6 each at 05/16/24 0902   divalproex  (DEPAKOTE ) DR tablet 1,000 mg, 1,000 mg, Oral, QHS, Barrett, Erin R, PA-C, 1,000 mg at 05/15/24 2137   divalproex  (DEPAKOTE ) DR tablet 250 mg, 250 mg, Oral, q morning, Barrett, Erin R, PA-C, 250 mg at 05/16/24 0857   divalproex  (DEPAKOTE ) DR tablet 500 mg, 500 mg, Oral, Q lunch, Barrett, Erin R, PA-C, 500 mg at 05/16/24 1413   DULoxetine  (CYMBALTA ) DR capsule 30 mg, 30 mg, Oral, TID, Barrett, Erin R, PA-C, 30 mg at 05/16/24 1413   enoxaparin  (LOVENOX ) injection 40 mg, 40 mg, Subcutaneous, Daily, Barrett, Erin R, PA-C, 40 mg at 05/16/24  0852   furosemide (LASIX) injection 40 mg, 40 mg, Intravenous, BID, Kerrin Elspeth BROCKS, MD, 40 mg at 05/16/24 0802   gabapentin  (NEURONTIN ) capsule 600 mg, 600 mg, Oral,  Daily, 600 mg at 05/16/24 0857 **AND** gabapentin  (NEURONTIN ) capsule 1,200 mg, 1,200 mg, Oral, QHS, Barrett, Erin R, PA-C, 1,200 mg at 05/15/24 2138   hydrALAZINE  (APRESOLINE ) injection 10 mg, 10 mg, Intravenous, Q6H PRN, Barrett, Erin R, PA-C   hydrOXYzine  (ATARAX ) tablet 10 mg, 10 mg, Oral, TID PRN, Gold, Wayne E, PA-C, 10 mg at 05/13/24 1739   ipratropium-albuterol  (DUONEB) 0.5-2.5 (3) MG/3ML nebulizer solution 3 mL, 3 mL, Nebulization, Q4H, Marshall, Jessica, DO, 3 mL at 05/16/24 1525   isosorbide  mononitrate (IMDUR ) 24 hr tablet 30 mg, 30 mg, Oral, Daily, Barrett, Erin R, PA-C, 30 mg at 05/16/24 0857   ketorolac  (TORADOL ) 15 MG/ML injection 15 mg, 15 mg, Intravenous, Q6H PRN, Su, Con RAMAN, MD, 15 mg at 05/15/24 2015   lactulose (CHRONULAC) 10 GM/15ML solution 30 g, 30 g, Oral, Daily PRN, Gold, Wayne E, PA-C, 30 g at 05/13/24 0948   lidocaine  (LIDODERM ) 5 % 1 patch, 1 patch, Transdermal, Daily, Su, Con RAMAN, MD, 1 patch at 05/16/24 9145   nicotine  (NICODERM CQ  - dosed in mg/24 hours) patch 21 mg, 21 mg, Transdermal, Daily, Kerrin Elspeth BROCKS, MD, 21 mg at 05/16/24 9147   ondansetron  (ZOFRAN ) injection 4 mg, 4 mg, Intravenous, Q6H PRN, Barrett, Erin R, PA-C   oxyCODONE  (Oxy IR/ROXICODONE ) immediate release tablet 5-10 mg, 5-10 mg, Oral, Q4H PRN, Sharie Bourbon, MD, 10 mg at 05/16/24 1413   pantoprazole  (PROTONIX ) EC tablet 40 mg, 40 mg, Oral, Daily, Barrett, Erin R, PA-C, 40 mg at 05/16/24 0857   QUEtiapine  (SEROQUEL ) tablet 200 mg, 200 mg, Oral, QHS, Starkes-Perry, Majel RAMAN, FNP   revefenacin (YUPELRI) nebulizer solution 175 mcg, 175 mcg, Nebulization, Daily, Hussein, Bourbon, MD, 175 mcg at 05/16/24 0752   senna-docusate (Senokot-S) tablet 2 tablet, 2 tablet, Oral, QHS, Hussein, Bourbon, MD, 2 tablet at 05/15/24  2137   sodium chloride  flush (NS) 0.9 % injection 10-40 mL, 10-40 mL, Intracatheter, Q12H, Kerrin Elspeth BROCKS, MD, 10 mL at 05/16/24 0902   sodium chloride  flush (NS) 0.9 % injection 10-40 mL, 10-40 mL, Intracatheter, PRN, Kerrin Elspeth BROCKS, MD   traMADol  (ULTRAM ) tablet 50-100 mg, 50-100 mg, Oral, Q6H PRN, Barrett, Erin R, PA-C, 100 mg at 05/16/24 1044  Allergies: Allergies  Allergen Reactions   Asenapine Other (See Comments) and Nausea And Vomiting    Increased tremors   Dextromethorphan Hbr    Guaifenesin     Latuda [Lurasidone Hcl] Other (See Comments)    Tremors     Lurasidone Other (See Comments) and Hives    Other Reaction(s): Angioedema   Phenylephrine      Majel RAMAN Ramp, FNP

## 2024-05-16 NOTE — Progress Notes (Signed)
 TCTS Evening Rounds:  Hemodynamically stable   Oxygen requirement improved from 90% and 45 L flow to 80% and 30 L flow over the course of the day. Ambulated in room.  Good UO  CT output low.  Seems reasonable comfortable.

## 2024-05-16 NOTE — Progress Notes (Signed)
 7 Days Post-Op Procedure(s) (LRB): WEDGE RESECTION, LUNG RIGHT UPPER LOBE, ROBOT-ASSISTED, THORACOSCOPIC (Right) LOBECTOMY, LUNG RIGHT UPPER LOBE, ROBOT-ASSISTED, USING VATS (Right) LYMPH NODE BIOPSY BLOCK, NERVE, INTERCOSTAL Subjective: Still has incisional pain  Objective: Vital signs in last 24 hours: Temp:  [98 F (36.7 C)-98.7 F (37.1 C)] 98.7 F (37.1 C) (10/23 0803) Pulse Rate:  [59-80] 77 (10/23 0600) Cardiac Rhythm: Normal sinus rhythm (10/23 0000) Resp:  [10-21] 18 (10/23 0600) BP: (93-138)/(52-94) 128/94 (10/23 0600) SpO2:  [82 %-98 %] 84 % (10/23 0755) FiO2 (%):  [80 %-90 %] 90 % (10/23 0752) Weight:  [113.4 kg] 113.4 kg (10/23 0600)  Hemodynamic parameters for last 24 hours:    Intake/Output from previous day: 10/22 0701 - 10/23 0700 In: 850 [P.O.:840; I.V.:10] Out: 3530 [Urine:3300; Chest Tube:230] Intake/Output this shift: No intake/output data recorded.  General appearance: alert, cooperative, and no distress Neurologic: intact Heart: regular rate and rhythm Lungs: rales bilaterally Wound: OK + air leak  Lab Results: Recent Labs    05/14/24 0550 05/15/24 0315  WBC 9.1 10.6*  HGB 10.8* 10.0*  HCT 30.5* 28.5*  PLT 161 163   BMET:  Recent Labs    05/14/24 0550 05/15/24 0315  NA 130* 129*  K 4.0 4.5  CL 96* 95*  CO2 24 24  GLUCOSE 127* 133*  BUN 17 31*  CREATININE 1.03 1.23  CALCIUM  8.1* 7.8*    PT/INR: No results for input(s): LABPROT, INR in the last 72 hours. ABG    Component Value Date/Time   PHART 7.39 05/14/2024 0034   HCO3 23.6 05/14/2024 0034   TCO2 28 05/30/2023 1229   ACIDBASEDEF 1.2 05/14/2024 0034   O2SAT 85.8 05/14/2024 0034   CBG (last 3)  Recent Labs    05/15/24 0746 05/15/24 1125 05/15/24 2317  GLUCAP 139* 137* 120*    Assessment/Plan: S/P Procedure(s) (LRB): WEDGE RESECTION, LUNG RIGHT UPPER LOBE, ROBOT-ASSISTED, THORACOSCOPIC (Right) LOBECTOMY, LUNG RIGHT UPPER LOBE, ROBOT-ASSISTED, USING VATS  (Right) LYMPH NODE BIOPSY BLOCK, NERVE, INTERCOSTAL POD # 7 Continues to have high O2 demand- on 45L HFNC Not in any distress but desaturates with minimal activity CXR stable with bilateral hazy opacities No change right apical space or air leak Diuresis + steroids No fever to suggest infectious process SCD + enoxaparin    LOS: 7 days    Keith Gibbs 05/16/2024

## 2024-05-16 NOTE — Progress Notes (Signed)
 NAME:  Keith Gibbs, MRN:  [id number], DOB:  25-May-1961, LOS: 7 ADMISSION DATE:  05/09/2024, CONSULTATION DATE: 05/14/2024 REFERRING MD: CTS, CHIEF COMPLAINT: Acute on chronic hypoxic respiratory failure  History of Present Illness:  63 yo male presented for lobectomy with CTS. Progressing as expected post operatively with CT in place and was on 4E when this morning he decompensated with worsening hypoxemia and increased wob. CTS transferred to ICU and requested CCM evaluation.     Pertinent  Medical History  RUL nodule s/p RUL lobectomy Bipolar 1 d/o Schizophrenia Polysubstance abuse Cad Mild to mod Ao insuff Htn Hyperlipidemia Tobacco use COPD Hep C  Significant Hospital Events: Including procedures, antibiotic start and stop dates in addition to other pertinent events   Admitted 10/16 for RUL Lobectomy Transferred to ICU 10/21   Interim History / Subjective:  Pulse oximeter sensor wrap moved from fingers to left earlobe with improved sats to >90% . Also diuresiing Otherwise conversational, tolerating diet    Objective    Blood pressure (!) 128/94, pulse 77, temperature 98.7 F (37.1 C), temperature source Oral, resp. rate 18, height 6' 3 (1.905 m), weight 113.4 kg, SpO2 (!) 84%.    FiO2 (%):  [80 %-90 %] 90 %   Intake/Output Summary (Last 24 hours) at 05/16/2024 9187 Last data filed at 05/16/2024 0600 Gross per 24 hour  Intake 850 ml  Output 3530 ml  Net -2680 ml   Filed Weights   05/12/24 0359 05/14/24 0215 05/16/24 0600  Weight: 115.4 kg 116.7 kg 113.4 kg    Examination: General: Elderly male, anxious on HHFNC HENT: NCAT,PEARL Lungs: Symmetric chest wall movement, crackles bilaterally Cardiovascular: Regular rate and rhythm, no murmurs or gallops Abdomen: Soft nontender nondistended Extremities: No edema Neuro: No focal deficit, baseline tremor GU: Deferred  Resolved problem list   Assessment and Plan   63 year old male who underwent right  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  Patient has been on diuretics, steroids, DuoNeb, and HHFNC Patient is also anxious exacerbating his hypoxia.  Sats are better when asleep> 95%, but in the high 80s when awake. We have adjusted meds to include: Seroquel  200 mg at bedtime, Yupelr daily, Brovana nebs BID, budesonide  BID, cut down Solu-Medrol  to 40 daily, Continue with: Xanax 0.25 mg PRN q8hrs, Atarax  10 mg TID,PRN, Depakote  250 mg AM, 500 mg with lunch and 1000 mg qhsz, duloxetine  30 mg  TID, gabapentin  600 mg daily & 1200 mg qhs LASIX 40 mg twice daily, DuoNeb Q4hrs, subacute Lovenox  40 mg, fentanyl  25 mcg q2hrs prn   Acute metabolic encephalopathy ICU delirium, in the setting of patient with a history of anxiety, and schizoaffective disorder bipolar type Acute on chronic hypoxic respiratory failure Acute COPD exacerbation Acute pulmonary edema Adeno CA of the lung s/p RUL lobectomy Persistent right pneumothorax s/p chest tube placement   Plan Continue heated high flow nasal cannula wean as tolerated to maintain sats> 90% Continue nebs, reducing steroids to 20 mg Solu-Medrol  Continue Lasix 40 mg twice daily Adjusted anxiety and psych meds, brought on board psychiatry team to help with optimization of the meds Strict I&O's Monitor chest tube output Monitor and replace electrolytes      Labs   CBC: Recent Labs  Lab 05/11/24 0347 05/12/24 0357 05/14/24 0550 05/15/24 0315 05/16/24 0748  WBC 8.2 8.1 9.1 10.6* 7.4  HGB 10.2* 10.8* 10.8*  10.0* 11.0*  HCT 29.8* 31.6* 30.5* 28.5* 33.1*  MCV 97.1 97.2 94.1 95.3 100.0  PLT 114* 127* 161 163 180    Basic Metabolic Panel: Recent Labs  Lab 05/10/24 0500 05/11/24 0347 05/12/24 0357 05/14/24 0550 05/15/24 0315  NA 133* 134* 134* 130* 129*  K 4.0 3.6 4.0 4.0 4.5  CL 100 99 101 96* 95*  CO2 24 28 26  24 24   GLUCOSE 127* 122* 109* 127* 133*  BUN 15 17 13 17  31*  CREATININE 1.09 1.19 0.93 1.03 1.23  CALCIUM  8.2* 7.8* 7.9* 8.1* 7.8*  MG  --   --   --  1.8 2.6*   GFR: Estimated Creatinine Clearance: 83.6 mL/min (by C-G formula based on SCr of 1.23 mg/dL). Recent Labs  Lab 05/12/24 0357 05/14/24 0550 05/14/24 0809 05/15/24 0315 05/16/24 0748  PROCALCITON  --   --  0.41  --   --   WBC 8.1 9.1  --  10.6* 7.4    Liver Function Tests: Recent Labs  Lab 05/11/24 0347  AST 24  ALT 11  ALKPHOS 35*  BILITOT 0.5  PROT 5.3*  ALBUMIN  2.9*   No results for input(s): LIPASE, AMYLASE in the last 168 hours. No results for input(s): AMMONIA in the last 168 hours.  ABG    Component Value Date/Time   PHART 7.39 05/14/2024 0034   PCO2ART 39 05/14/2024 0034   PO2ART 54 (L) 05/14/2024 0034   HCO3 23.6 05/14/2024 0034   TCO2 28 05/30/2023 1229   ACIDBASEDEF 1.2 05/14/2024 0034   O2SAT 85.8 05/14/2024 0034     Coagulation Profile: No results for input(s): INR, PROTIME in the last 168 hours.  Cardiac Enzymes: No results for input(s): CKTOTAL, CKMB, CKMBINDEX, TROPONINI in the last 168 hours.  HbA1C: Hgb A1c MFr Bld  Date/Time Value Ref Range Status  02/12/2024 08:40 AM 5.3 4.8 - 5.6 % Final    Comment:             Prediabetes: 5.7 - 6.4          Diabetes: >6.4          Glycemic control for adults with diabetes: <7.0   04/26/2022 03:30 PM 5.2 4.8 - 5.6 % Final    Comment:    (NOTE) Pre diabetes:          5.7%-6.4%  Diabetes:              >6.4%  Glycemic control for   <7.0% adults with diabetes     CBG: Recent Labs  Lab 05/14/24 1535 05/14/24 2105 05/15/24 0746 05/15/24 1125 05/15/24 2317  GLUCAP 167* 130* 139* 137* 120*    Review of Systems:   Negative except above  Past Medical History:  He,  has a past medical history of Anginal pain, Anxiety, Arthritis, Bipolar 1 disorder (HCC), Bursitis, CAD (coronary artery disease), Cancer (HCC),  Chronic pain, COPD (chronic obstructive pulmonary disease) (HCC), Current use of long term anticoagulation, Depression, Diverticulitis, Dyspnea, GERD (gastroesophageal reflux disease), Grade I diastolic dysfunction, Hepatitis C (2012), HLD (hyperlipidemia), Hypertension, MI (myocardial infarction) (HCC), Polysubstance abuse (HCC), PUD (peptic ulcer disease), S/P angioplasty with stent (06/10/2016), S/P PTCA (percutaneous transluminal coronary angioplasty) (12/04/2019), Schizophrenia (HCC), Stroke (HCC), Tremors, and Valvular insufficiency.   Surgical History:   Past Surgical History:  Procedure Laterality Date   ABDOMINAL SURGERY     removed small piece of intestines due to [facility] Diverticulosis   ANTERIOR LAT LUMBAR FUSION N/A 08/23/2022   Procedure:  DIRECT LATERAL INTERBODY FUSION  LUMBAR TWO- LUMBAR THREE, LUMBAR THREE-LUMBAR FOUR , EXPLORE AND EXTEND FUSION LUMBAR TWO-LUMBAR FIVE, POSTERIOR DECOMPRESSION LUMBAR THREE-LUMBAR FOUR, LEFT LUMBAR TWO-LUMBAR THREE;  Surgeon: Debby Dorn MATSU, MD;  Location: [facility] OR;  Service: Neurosurgery;  Laterality: N/A;   APPENDECTOMY     BACK SURGERY     CARDIAC CATHETERIZATION Left 06/10/2016   Procedure: Left Heart Cath and Coronary Angiography;  Surgeon: Denyse DELENA Bathe, MD;  Location: [facility] INVASIVE CV LAB;  Service: Cardiovascular;  Laterality: Left;   CARDIAC CATHETERIZATION N/A 06/10/2016   Procedure: Coronary Stent Intervention;  Surgeon: Cara JONETTA Lovelace, MD;  Location: [facility] INVASIVE CV LAB;  Service: Cardiovascular;  Laterality: N/A;   CHOLECYSTECTOMY N/A 02/17/2022   Procedure: LAPAROSCOPIC CHOLECYSTECTOMY;  Surgeon: Stevie Herlene Righter, MD;  Location: [facility] OR;  Service: General;  Laterality: N/A;   COLON SURGERY     COLONOSCOPY     COLONOSCOPY WITH PROPOFOL  N/A 01/05/2017   Procedure: COLONOSCOPY WITH PROPOFOL ;  Surgeon: Therisa Bi, MD;  Location[facility] ENDOSCOPY;  Service: Endoscopy;  Laterality: N/A;   COLONOSCOPY WITH PROPOFOL  N/A 02/13/2020    Procedure: COLONOSCOPY WITH PROPOFOL ;  Surgeon: Janalyn Keene NOVAK, MD;  Location: [facility] ENDOSCOPY;  Service: Endoscopy;  Laterality: N/A;   CORONARY ANGIOPLASTY WITH STENT PLACEMENT     CORONARY PRESSURE/FFR STUDY N/A 12/04/2019   Procedure: INTRAVASCULAR PRESSURE WIRE/FFR STUDY;  Surgeon: Wonda Sharper, MD;  Location: [facility] INVASIVE CV LAB;  Service: Cardiovascular;  Laterality: N/A;   ESOPHAGOGASTRODUODENOSCOPY (EGD) WITH PROPOFOL  N/A 01/05/2017   Procedure: ESOPHAGOGASTRODUODENOSCOPY (EGD) WITH PROPOFOL ;  Surgeon: Therisa Bi, MD;  Location: [facility] ENDOSCOPY;  Service: Endoscopy;  Laterality: N/A;   ESOPHAGOGASTRODUODENOSCOPY (EGD) WITH PROPOFOL  N/A 02/13/2020   Procedure: ESOPHAGOGASTRODUODENOSCOPY (EGD) WITH PROPOFOL ;  Surgeon: Janalyn Keene NOVAK, MD;  Location: [facility] ENDOSCOPY;  Service: Endoscopy;  Laterality: N/A;   INTERCOSTAL NERVE BLOCK  05/09/2024   Procedure: BLOCK, NERVE, INTERCOSTAL;  Surgeon: Kerrin Elspeth BROCKS, MD;  Location[facility] OR;  Service: Thoracic;;   KNEE ARTHROSCOPY WITH MEDIAL MENISECTOMY Right 09/05/2017   Procedure: KNEE ARTHROSCOPY WITH MEDIAL AND LATERAL  MENISECTOMY PARTIAL SYNOVECTOMY;  Surgeon: Kathlynn Sharper, MD;  Location: [facility] ORS;  Service: Orthopedics;  Laterality: Right;   LEFT HEART CATH AND CORONARY ANGIOGRAPHY N/A 12/04/2019   Procedure: LEFT HEART CATH AND CORONARY ANGIOGRAPHY;  Surgeon: Wonda Sharper, MD;  [facility]VASIVE CV LAB;  Service: Cardiovascular;  Laterality: N/A;   LEFT HEART CATH AND CORONARY ANGIOGRAPHY Left 11/25/2022   Procedure: LEFT HEART CATH AND CORONARY ANGIOGRAPHY;  Surgeon: Bathe Denyse DELENA, MD;  Location: [facility] INVASIVE CV LAB;  Service: Cardiovascular;  Laterality: Left;   LOBECTOMY, LUNG, ROBOT-ASSISTED, USING VATS Right 05/09/2024   Procedure: LOBECTOMY, LUNG RIGHT UPPER LOBE, ROBOT-ASSISTED, USING VATS;  Surgeon: Kerrin Elspeth BROCKS, MD;  Location: [facility] OR;  Service: Thoracic;  Laterality: Right;   LYMPH NODE BIOPSY   05/09/2024   Procedure: LYMPH NODE BIOPSY;  Surgeon: Kerrin Elspeth BROCKS, MD;  Location: [facility] OR;  Service: Thoracic;;   REVERSE SHOULDER ARTHROPLASTY Right 08/31/2023   Procedure: RIGHT REVERSE SHOULDER ARTHROPLASTY;  Surgeon: [name], MD;  Location: [facility] OR;  Service: Orthopedics;  Laterality: Right;   SHOULDER SURGERY Right 04/09/2012   SPINE SURGERY     TOTAL KNEE ARTHROPLASTY Right 01/19/2021   Procedure: TOTAL KNEE ARTHROPLASTY - [place] Amber to Assist;  Surgeon: Kathlynn Sharper, MD;  Location: [facility] ORS;  Service: Orthopedics;  Laterality: Right;   WEDGE RESECTION, LUNG, ROBOT-ASSISTED, THORACOSCOPIC Right 05/09/2024  Procedure: WEDGE RESECTION, LUNG RIGHT UPPER LOBE, ROBOT-ASSISTED, THORACOSCOPIC;  Surgeon: Kerrin Elspeth BROCKS, MD;  Location: [facility] OR;  Service: Thoracic;  Laterality: Right;     Social History:   reports that he quit smoking about 25 years ago. His smoking use included cigarettes. He started smoking about 45 years ago. He has a 63.8 pack-year smoking history. He has been exposed to tobacco smoke. He has never used smokeless tobacco. He reports that he does not currently use alcohol  after a past usage of about 3.0 standard drinks of alcohol  per week. He reports that he does not currently use drugs after having used the following drugs: Cocaine and Marijuana.   Family History:  His family history includes Depression in his mother; Early death in his father; Heart attack in his father; Heart disease in his father and mother; Hypertension in his father, mother, and sister; Osteoarthritis in his mother; Parkinson's disease in his maternal grandfather. There is no history of Prostate cancer, Bladder Cancer, Kidney cancer, or Tremor.   Allergies Allergies  Allergen Reactions   Asenapine Other (See Comments) and Nausea And Vomiting    Increased tremors   Dextromethorphan Hbr    Guaifenesin     Latuda [Lurasidone Hcl] Other (See Comments)    Tremors     Lurasidone  Other (See Comments) and Hives    Other Reaction(s): Angioedema   Phenylephrine       Home Medications  Prior to Admission medications   Medication Sig Start Date End Date Taking? Authorizing Provider  acetaminophen  (TYLENOL ) 325 MG tablet Take 1 tablet (325 mg total) by mouth every 6 (six) hours as needed for mild pain (pain score 1-3) (or temp > 100.5). 09/01/23  Yes [name], MD  albuterol  (VENTOLIN  HFA) 108 [phone] Base) MCG/ACT inhaler Inhale 2 puffs into the lungs every 6 (six) hours as needed for wheezing or shortness of breath. 12/19/23  Yes Lorren Greig PARAS, NP  aspirin  EC 81 MG tablet Take 1 tablet (81 mg total) by mouth daily. Swallow whole. 02/03/23  Yes Evelena, Nadir, MD  atorvastatin  (LIPITOR ) 80 MG tablet TAKE 1 TABLET BY MOUTH EVERY DAY 01/15/24  Yes [name], MD  budesonide -glycopyrrolate -formoterol  (BREZTRI  AEROSPHERE) 160-9-4.8 MCG/ACT AERO inhaler Inhale 2 puffs into the lungs in the morning and at bedtime. 12/19/23  Yes Lorren Greig PARAS, NP  celecoxib  (CELEBREX ) 100 MG capsule TAKE 2 CAPSULES BY MOUTH TWICE A DAY 04/25/24  Yes Georgina Ozell LABOR, MD  divalproex  (DEPAKOTE ) 500 MG DR tablet Take half tablet in the morning and 3 tablets at bedtime Patient taking differently: Take half tablet in the morning, 1 in the afternoon, and 2 tablets at bedtime 04/25/24  Yes Izella Ismael NOVAK, MD  DULoxetine  (CYMBALTA ) 30 MG capsule Take 3 capsules (90 mg total) by mouth daily. Patient taking differently: Take 30 mg by mouth 3 (three) times daily. 04/25/24 06/24/24 Yes Izella Ismael NOVAK, MD  gabapentin  (NEURONTIN ) 600 MG tablet Take 1 tablet (600 mg total) by mouth daily AND 2 tablets (1,200 mg total) at bedtime. 04/25/24  Yes Izella Ismael NOVAK, MD  hydrOXYzine  (ATARAX ) 25 MG tablet Take 1 tablet (25 mg total) by mouth 2 (two) times daily as needed. Patient taking differently: Take 25 mg by mouth daily. 04/25/24  Yes Izella Ismael NOVAK, MD  isosorbide  mononitrate (IMDUR ) 30 MG 24 hr tablet TAKE 1  TABLET BY MOUTH 2 TIMES DAILY. 04/09/24  Yes Fernand Denyse LABOR, MD  Multiple Vitamins-Minerals (MENS 50+ MULTIVITAMIN) TABS Take 1 tablet  by mouth in the morning.   Yes [provider]  nicotine  polacrilex (NICOTINE  MINI) 2 MG lozenge Take 1 lozenge (2 mg total) by mouth every 2 (two) hours as needed for smoking cessation. 03/26/24 06/24/24 Yes Dgayli, Belva, MD  nitroGLYCERIN  (NITROSTAT ) 0.4 MG SL tablet Place 1 tablet (0.4 mg total) under the tongue every 5 (five) minutes as needed for chest pain. 02/03/23 05/06/24 Yes Evelena Figures, MD  pantoprazole  (PROTONIX ) 40 MG tablet Take 1 tablet (40 mg total) by mouth daily. 12/19/23  Yes Lorren, Amy J, NP  QUEtiapine  (SEROQUEL ) 200 MG tablet Take 1 tablet (200 mg total) by mouth at bedtime. 04/25/24  Yes Izella Ismael NOVAK, MD  traZODone  (DESYREL ) 100 MG tablet Take 0.5 tablets (50 mg total) by mouth at bedtime for 7 days. Patient not taking: Reported on 05/06/2024 04/25/24 05/02/24  Izella Ismael NOVAK, MD           Lenny Drought, MD  [facility] Prefer epic messenger for cross cover needs   My critical care time: 30 minutes  Critical care time was exclusive of separately billable procedures and treating other patients.  Critical care was necessary to treat or prevent imminent or life-threatening deterioration.  Critical care was time spent personally by me on the following activities: development of treatment plan with patient and/or surrogate as well as nursing, discussions with consultants, evaluation of patient's response to treatment, examination of patient, obtaining history from patient or surrogate, ordering and performing treatments and interventions, ordering and review of laboratory studies, ordering and review of radiographic studies, pulse oximetry, re-evaluation of patient's condition and participation in multidisciplinary rounds.   05/16/2024, 8:12 AM

## 2024-05-17 ENCOUNTER — Inpatient Hospital Stay (HOSPITAL_COMMUNITY): Payer: MEDICAID

## 2024-05-17 ENCOUNTER — Ambulatory Visit (HOSPITAL_COMMUNITY): Payer: MEDICAID

## 2024-05-17 DIAGNOSIS — J810 Acute pulmonary edema: Secondary | ICD-10-CM | POA: Diagnosis not present

## 2024-05-17 DIAGNOSIS — J9621 Acute and chronic respiratory failure with hypoxia: Secondary | ICD-10-CM | POA: Diagnosis not present

## 2024-05-17 DIAGNOSIS — J449 Chronic obstructive pulmonary disease, unspecified: Secondary | ICD-10-CM | POA: Diagnosis not present

## 2024-05-17 DIAGNOSIS — G9341 Metabolic encephalopathy: Secondary | ICD-10-CM | POA: Diagnosis not present

## 2024-05-17 LAB — CBC
HCT: 32.6 % — ABNORMAL LOW (ref 39.0–52.0)
Hemoglobin: 11.2 g/dL — ABNORMAL LOW (ref 13.0–17.0)
MCH: 33 pg (ref 26.0–34.0)
MCHC: 34.4 g/dL (ref 30.0–36.0)
MCV: 96.2 fL (ref 80.0–100.0)
Platelets: 213 K/uL (ref 150–400)
RBC: 3.39 MIL/uL — ABNORMAL LOW (ref 4.22–5.81)
RDW: 14.1 % (ref 11.5–15.5)
WBC: 10.5 K/uL (ref 4.0–10.5)
nRBC: 0 % (ref 0.0–0.2)

## 2024-05-17 LAB — POCT I-STAT 7, (LYTES, BLD GAS, ICA,H+H)
Acid-Base Excess: 5 mmol/L — ABNORMAL HIGH (ref 0.0–2.0)
Bicarbonate: 28.7 mmol/L — ABNORMAL HIGH (ref 20.0–28.0)
Calcium, Ion: 1.09 mmol/L — ABNORMAL LOW (ref 1.15–1.40)
HCT: 32 % — ABNORMAL LOW (ref 39.0–52.0)
Hemoglobin: 10.9 g/dL — ABNORMAL LOW (ref 13.0–17.0)
O2 Saturation: 85 %
Patient temperature: 99.2
Potassium: 4.1 mmol/L (ref 3.5–5.1)
Sodium: 129 mmol/L — ABNORMAL LOW (ref 135–145)
TCO2: 30 mmol/L (ref 22–32)
pCO2 arterial: 40.2 mmHg (ref 32–48)
pH, Arterial: 7.463 — ABNORMAL HIGH (ref 7.35–7.45)
pO2, Arterial: 48 mmHg — ABNORMAL LOW (ref 83–108)

## 2024-05-17 LAB — GLUCOSE, CAPILLARY
Glucose-Capillary: 134 mg/dL — ABNORMAL HIGH (ref 70–99)
Glucose-Capillary: 171 mg/dL — ABNORMAL HIGH (ref 70–99)
Glucose-Capillary: 161 mg/dL — ABNORMAL HIGH (ref 70–99)

## 2024-05-17 LAB — BASIC METABOLIC PANEL WITH GFR
Anion gap: 12 (ref 5–15)
BUN: 22 mg/dL (ref 8–23)
CO2: 28 mmol/L (ref 22–32)
Calcium: 8.1 mg/dL — ABNORMAL LOW (ref 8.9–10.3)
Chloride: 92 mmol/L — ABNORMAL LOW (ref 98–111)
Creatinine, Ser: 0.96 mg/dL (ref 0.61–1.24)
GFR, Estimated: >60 mL/min (ref >60)
Glucose, Bld: 96 mg/dL (ref 70–99)
Potassium: 3.9 mmol/L (ref 3.5–5.1)
Sodium: 132 mmol/L — ABNORMAL LOW (ref 135–145)

## 2024-05-17 LAB — PROCALCITONIN: Procalcitonin: 0.59 ng/mL

## 2024-05-17 LAB — C-REACTIVE PROTEIN: CRP: 27.6 mg/dL — ABNORMAL HIGH (ref <1.0)

## 2024-05-17 MED ORDER — QUETIAPINE FUMARATE 100 MG PO TABS
100.0000 mg | ORAL_TABLET | Freq: Every day | ORAL | Status: DC
  Administered 2024-05-17 – 2024-05-18 (×2): 100 mg via ORAL
  Filled 2024-05-17 (×2): qty 1

## 2024-05-17 MED ORDER — PIPERACILLIN-TAZOBACTAM 3.375 G IVPB
3.3750 g | Freq: Three times a day (TID) | INTRAVENOUS | Status: DC
  Administered 2024-05-17 – 2024-05-19 (×6): 3.375 g via INTRAVENOUS
  Filled 2024-05-17 (×6): qty 50

## 2024-05-17 MED ORDER — POTASSIUM CHLORIDE CRYS ER 20 MEQ PO TBCR
20.0000 meq | EXTENDED_RELEASE_TABLET | Freq: Two times a day (BID) | ORAL | Status: DC
  Administered 2024-05-17 – 2024-05-18 (×4): 20 meq via ORAL
  Filled 2024-05-17 (×4): qty 1

## 2024-05-17 MED ORDER — HYDROXYZINE HCL 10 MG PO TABS
10.0000 mg | ORAL_TABLET | Freq: Three times a day (TID) | ORAL | Status: DC | PRN
Start: 2024-05-17 — End: 2024-05-19
  Administered 2024-05-17 – 2024-05-18 (×3): 10 mg via ORAL
  Filled 2024-05-17 (×3): qty 1

## 2024-05-17 NOTE — Progress Notes (Signed)
 TCTS PM Rounding Progress Note  Slightly increased oxygen requirements throughout the day today.  Diuresing well, started on antibiotics today  Vitals:   05/17/24 1700 05/17/24 1800  BP: (!) 102/58 (!) 118/59  Pulse: 96 87  Resp: 20 19  Temp:    SpO2: 91% 90%    Plan: - Continue abx, wean oxygen as tolerated  Con Clunes, MD Cardiothoracic Surgery Pager: [phone]

## 2024-05-17 NOTE — Plan of Care (Signed)
  Problem: Clinical Measurements: Goal: Respiratory complications will improve Outcome: Not Progressing   Problem: Clinical Measurements: Goal: Cardiovascular complication will be avoided Outcome: Progressing   Problem: Clinical Measurements: Goal: Diagnostic test results will improve Outcome: Progressing   Problem: Clinical Measurements: Goal: Will remain free from infection Outcome: Progressing   Problem: Nutrition: Goal: Adequate nutrition will be maintained Outcome: Progressing

## 2024-05-17 NOTE — Progress Notes (Signed)
 8 Days Post-Op Procedure(s) (LRB): WEDGE RESECTION, LUNG RIGHT UPPER LOBE, ROBOT-ASSISTED, THORACOSCOPIC (Right) LOBECTOMY, LUNG RIGHT UPPER LOBE, ROBOT-ASSISTED, USING VATS (Right) LYMPH NODE BIOPSY BLOCK, NERVE, INTERCOSTAL Subjective: Anxious. C/o pain at CT site  Objective: Vital signs in last 24 hours: Temp:  [97.9 F (36.6 C)-98.7 F (37.1 C)] 97.9 F (36.6 C) (10/24 0350) Pulse Rate:  [63-90] 66 (10/24 0700) Cardiac Rhythm: Normal sinus rhythm (10/24 0000) Resp:  [14-35] 17 (10/24 0700) BP: (102-153)/(56-139) 114/68 (10/24 0700) SpO2:  [75 %-100 %] 100 % (10/24 0700) FiO2 (%):  [80 %-90 %] 90 % (10/24 0328) Weight:  [111.6 kg] 111.6 kg (10/24 0600)  Hemodynamic parameters for last 24 hours:    Intake/Output from previous day: 10/23 0701 - 10/24 0700 In: -  Out: 4775 [Urine:4625; Chest Tube:150] Intake/Output this shift: No intake/output data recorded.  General appearance: alert, cooperative, and mild distress Neurologic: intact Heart: regular rate and rhythm Lungs: rhonchi bilaterally Abdomen: normal findings: soft, non-tender   Lab Results: Recent Labs    05/16/24 0748 05/17/24 0417  WBC 7.4 10.5  HGB 11.0* 11.2*  HCT 33.1* 32.6*  PLT 180 213   BMET:  Recent Labs    05/16/24 0748 05/17/24 0417  NA 131* 132*  K 4.1 3.9  CL 93* 92*  CO2 24 28  GLUCOSE 102* 96  BUN 23 22  CREATININE 0.91 0.96  CALCIUM  8.2* 8.1*    PT/INR: No results for input(s): LABPROT, INR in the last 72 hours. ABG    Component Value Date/Time   PHART 7.39 05/14/2024 0034   HCO3 23.6 05/14/2024 0034   TCO2 28 05/30/2023 1229   ACIDBASEDEF 1.2 05/14/2024 0034   O2SAT 85.8 05/14/2024 0034   CBG (last 3)  Recent Labs    05/16/24 1122 05/16/24 1623 05/16/24 2121  GLUCAP 132* 121* 123*    Assessment/Plan: S/P Procedure(s) (LRB): WEDGE RESECTION, LUNG RIGHT UPPER LOBE, ROBOT-ASSISTED, THORACOSCOPIC (Right) LOBECTOMY, LUNG RIGHT UPPER LOBE, ROBOT-ASSISTED,  USING VATS (Right) LYMPH NODE BIOPSY BLOCK, NERVE, INTERCOSTAL Pulmonary status remains tenuous   NEURO- intact  PSYCH- anxiety- on meds  CV- in SR, BP stable  RESP- acute on chronic respiratory failure On nebs, steroids and diuretics  4L negative yesterday without significant impact on O2 demand or CXR CXR still shows a right apical space + diffuse AS opacity bilaterally  RENAL- creatinine stable, diuresed well yesterday  K 3.9 - supplement  ENDO- CBG well controlled  Gi- tolerating diet  SCD + enoxaparin  for DVT prophylaxis   LOS: 8 days    [name] 05/17/2024

## 2024-05-17 NOTE — Progress Notes (Addendum)
 NAME:  Keith Gibbs, MRN:  [id number], DOB:  Oct 23, 1960, LOS: 8 ADMISSION DATE:  05/09/2024, CONSULTATION DATE: 05/14/2024 REFERRING MD: CTS, CHIEF COMPLAINT: Acute on chronic hypoxic respiratory failure  History of Present Illness:  63 yo male presented for lobectomy with CTS. Progressing as expected post operatively with CT in place and was on 4E when this morning he decompensated with worsening hypoxemia and increased wob. CTS transferred to ICU and requested CCM evaluation.     Pertinent  Medical History  RUL nodule s/p RUL lobectomy Bipolar 1 d/o Schizophrenia Polysubstance abuse Cad Mild to mod Ao insuff Htn Hyperlipidemia Tobacco use COPD Hep C  Significant Hospital Events: Including procedures, antibiotic start and stop dates in addition to other pertinent events   Admitted 10/16 for RUL Lobectomy Transferred to ICU 10/21   Interim History / Subjective:  Slowly improving, still on high levels of HHFNC, will try to come down. Continuing diuresiing conversational, tolerating diet    Objective    Blood pressure 114/68, pulse 66, temperature 97.9 F (36.6 C), temperature source Axillary, resp. rate 17, height 6' 3 (1.905 m), weight 111.6 kg, SpO2 90%.    FiO2 (%):  [60 %-90 %] 60 %   Intake/Output Summary (Last 24 hours) at 05/17/2024 0810 Last data filed at 05/17/2024 0800 Gross per 24 hour  Intake --  Output 4665 ml  Net -4665 ml   Filed Weights   05/14/24 0215 05/16/24 0600 05/17/24 0600  Weight: 116.7 kg 113.4 kg 111.6 kg    Examination: General: Elderly male, anxious on HHFNC HENT: NCAT,PEARL Lungs: Symmetric chest wall movement, crackles bilaterally Cardiovascular: Regular rate and rhythm, no murmurs or gallops Abdomen: Soft nontender nondistended Extremities: No edema Neuro: No focal deficit, baseline tremor GU: Deferred  Resolved problem list   Assessment and Plan   63 year old male who underwent 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  Patient has been on diuretics, steroids, DuoNeb, and HHFNC Patient is also anxious exacerbating his hypoxia.  Sats are better when asleep> 95%, but in the high 80s when awake. We have adjusted meds to include: Seroquel  200 mg at bedtime, Yupelr daily, Brovana nebs BID, budesonide  BID, cut down Solu-Medrol  to 40 daily, Continue with: Xanax 0.25 mg PRN q8hrs, Atarax  10 mg TID,PRN, Depakote  250 mg AM, 500 mg with lunch and 1000 mg qhsz, duloxetine  30 mg  TID, gabapentin  600 mg daily & 1200 mg qhs LASIX 40 mg twice daily, DuoNeb Q4hrs, subacute Lovenox  40 mg, fentanyl  25 mcg q2hrs prn   Acute metabolic encephalopathy ICU delirium, in the setting of patient with a history of anxiety, and schizoaffective disorder bipolar type Acute on chronic hypoxic respiratory failure Acute COPD exacerbation Acute pulmonary edema Adeno CA of the lung s/p RUL lobectomy Persistent right pneumothorax s/p chest tube placement   Plan Still on high levels of heated high flow nasal cannula, will try to wean off to aim sats> 90% Will start broad spectrum antibiotics-zosyn, if MRSA nares negative will not start vanco Continue nebs, reducing steroids to 20 mg Solu-Medrol  Continue Lasix 40 mg twice daily Adjusting anxiety and psych meds,  Strict I&O's Monitor chest tube output Monitor and replace electrolytes    Labs   CBC: Recent Labs  Lab 05/12/24 0357 05/14/24 0550 05/15/24 0315 05/16/24 0748 05/17/24 0417  WBC 8.1 9.1 10.6* 7.4 10.5  HGB 10.8* 10.8* 10.0* 11.0*  11.2*  HCT 31.6* 30.5* 28.5* 33.1* 32.6*  MCV 97.2 94.1 95.3 100.0 96.2  PLT 127* 161 163 180 213    Basic Metabolic Panel: Recent Labs  Lab 05/12/24 0357 05/14/24 0550 05/15/24 0315 05/16/24 0748 05/17/24 0417  NA 134* 130* 129* 131* 132*  K 4.0 4.0 4.5 4.1 3.9  CL 101 96* 95* 93* 92*  CO2 26 24  24 24 28   GLUCOSE 109* 127* 133* 102* 96  BUN 13 17 31* 23 22  CREATININE 0.93 1.03 1.23 0.91 0.96  CALCIUM  7.9* 8.1* 7.8* 8.2* 8.1*  MG  --  1.8 2.6*  --   --    GFR: Estimated Creatinine Clearance: 106.2 mL/min (by C-G formula based on SCr of 0.96 mg/dL). Recent Labs  Lab 05/14/24 0550 05/14/24 0809 05/15/24 0315 05/16/24 0748 05/17/24 0417  PROCALCITON  --  0.41  --   --   --   WBC 9.1  --  10.6* 7.4 10.5    Liver Function Tests: Recent Labs  Lab 05/11/24 0347  AST 24  ALT 11  ALKPHOS 35*  BILITOT 0.5  PROT 5.3*  ALBUMIN  2.9*   No results for input(s): LIPASE, AMYLASE in the last 168 hours. No results for input(s): AMMONIA in the last 168 hours.  ABG    Component Value Date/Time   PHART 7.39 05/14/2024 0034   PCO2ART 39 05/14/2024 0034   PO2ART 54 (L) 05/14/2024 0034   HCO3 23.6 05/14/2024 0034   TCO2 28 05/30/2023 1229   ACIDBASEDEF 1.2 05/14/2024 0034   O2SAT 85.8 05/14/2024 0034     Coagulation Profile: No results for input(s): INR, PROTIME in the last 168 hours.  Cardiac Enzymes: No results for input(s): CKTOTAL, CKMB, CKMBINDEX, TROPONINI in the last 168 hours.  HbA1C: Hgb A1c MFr Bld  Date/Time Value Ref Range Status  02/12/2024 08:40 AM 5.3 4.8 - 5.6 % Final    Comment:             Prediabetes: 5.7 - 6.4          Diabetes: >6.4          Glycemic control for adults with diabetes: <7.0   04/26/2022 03:30 PM 5.2 4.8 - 5.6 % Final    Comment:    (NOTE) Pre diabetes:          5.7%-6.4%  Diabetes:              >6.4%  Glycemic control for   <7.0% adults with diabetes     CBG: Recent Labs  Lab 05/15/24 2317 05/16/24 0805 05/16/24 1122 05/16/24 1623 05/16/24 2121  GLUCAP 120* 134* 132* 121* 123*    Review of Systems:   Negative except above  Past Medical History:  He,  has a past medical history of Anginal pain, Anxiety, Arthritis, Bipolar 1 disorder (HCC), Bursitis, CAD (coronary artery disease), Cancer (HCC),  Chronic pain, COPD (chronic obstructive pulmonary disease) (HCC), Current use of long term anticoagulation, Depression, Diverticulitis, Dyspnea, GERD (gastroesophageal reflux disease), Grade I diastolic dysfunction, Hepatitis C (2012), HLD (hyperlipidemia), Hypertension, MI (myocardial infarction) (HCC), Polysubstance abuse (HCC), PUD (peptic ulcer disease), S/P angioplasty with stent (06/10/2016), S/P PTCA (percutaneous transluminal coronary angioplasty) (12/04/2019), Schizophrenia (HCC), Stroke (HCC), Tremors, and Valvular insufficiency.   Surgical History:   Past Surgical History:  Procedure Laterality Date   ABDOMINAL SURGERY     removed small piece of intestines due to [facility] Diverticulosis   ANTERIOR LAT LUMBAR FUSION N/A 08/23/2022   Procedure: DIRECT  LATERAL INTERBODY FUSION  LUMBAR TWO- LUMBAR THREE, LUMBAR THREE-LUMBAR FOUR , EXPLORE AND EXTEND FUSION LUMBAR TWO-LUMBAR FIVE, POSTERIOR DECOMPRESSION LUMBAR THREE-LUMBAR FOUR, LEFT LUMBAR TWO-LUMBAR THREE;  Surgeon: Debby Dorn MATSU, MD;  Location: [facility] OR;  Service: Neurosurgery;  Laterality: N/A;   APPENDECTOMY     BACK SURGERY     CARDIAC CATHETERIZATION Left 06/10/2016   Procedure: Left Heart Cath and Coronary Angiography;  Surgeon: Denyse DELENA Bathe, MD;  Location: [facility] INVASIVE CV LAB;  Service: Cardiovascular;  Laterality: Left;   CARDIAC CATHETERIZATION N/A 06/10/2016   Procedure: Coronary Stent Intervention;  Surgeon: Cara JONETTA Lovelace, MD;  Location: [facility] INVASIVE CV LAB;  Service: Cardiovascular;  Laterality: N/A;   CHOLECYSTECTOMY N/A 02/17/2022   Procedure: LAPAROSCOPIC CHOLECYSTECTOMY;  Surgeon: Stevie Herlene Righter, MD;  Location: [facility] OR;  Service: General;  Laterality: N/A;   COLON SURGERY     COLONOSCOPY     COLONOSCOPY WITH PROPOFOL  N/A 01/05/2017   Procedure: COLONOSCOPY WITH PROPOFOL ;  Surgeon: Therisa Bi, MD;  Location: [facility] ENDOSCOPY;  Service: Endoscopy;  Laterality: N/A;   COLONOSCOPY WITH PROPOFOL  N/A 02/13/2020    Procedure: COLONOSCOPY WITH PROPOFOL ;  Surgeon: Janalyn Keene NOVAK, MD;  Location: [facility] ENDOSCOPY;  Service: Endoscopy;  Laterality: N/A;   CORONARY ANGIOPLASTY WITH STENT PLACEMENT     CORONARY PRESSURE/FFR STUDY N/A 12/04/2019   Procedure: INTRAVASCULAR PRESSURE WIRE/FFR STUDY;  Surgeon: Wonda Sharper, MD;  Location: [facility] INVASIVE CV LAB;  Service: Cardiovascular;  Laterality: N/A;   ESOPHAGOGASTRODUODENOSCOPY (EGD) WITH PROPOFOL  N/A 01/05/2017   Procedure: ESOPHAGOGASTRODUODENOSCOPY (EGD) WITH PROPOFOL ;  Surgeon: Therisa Bi, MD;  Location: [facility] ENDOSCOPY;  Service: Endoscopy;  Laterality: N/A;   ESOPHAGOGASTRODUODENOSCOPY (EGD) WITH PROPOFOL  N/A 02/13/2020   Procedure: ESOPHAGOGASTRODUODENOSCOPY (EGD) WITH PROPOFOL ;  Surgeon: Janalyn Keene NOVAK, MD;  Location: [facility] ENDOSCOPY;  Service: Endoscopy;  Laterality: N/A;   INTERCOSTAL NERVE BLOCK  05/09/2024   Procedure: BLOCK, NERVE, INTERCOSTAL;  Surgeon: Kerrin Elspeth BROCKS, MD;  Location: [facility] OR;  Service: Thoracic;;   KNEE ARTHROSCOPY WITH MEDIAL MENISECTOMY Right 09/05/2017   Procedure: KNEE ARTHROSCOPY WITH MEDIAL AND LATERAL  MENISECTOMY PARTIAL SYNOVECTOMY;  Surgeon: Kathlynn Sharper, MD;  Location: [facility] ORS;  Service: Orthopedics;  Laterality: Right;   LEFT HEART CATH AND CORONARY ANGIOGRAPHY N/A 12/04/2019   Procedure: LEFT HEART CATH AND CORONARY ANGIOGRAPHY;  Surgeon: Wonda Sharper, MD;  Location: [facility] INVASIVE CV LAB;  Service: Cardiovascular;  Laterality: N/A;   LEFT HEART CATH AND CORONARY ANGIOGRAPHY Left 11/25/2022   Procedure: LEFT HEART CATH AND CORONARY ANGIOGRAPHY;  Surgeon: Bathe Denyse DELENA, MD;  Location: [facility] INVASIVE CV LAB;  Service: Cardiovascular;  Laterality: Left;   LOBECTOMY, LUNG, ROBOT-ASSISTED, USING VATS Right 05/09/2024   Procedure: LOBECTOMY, LUNG RIGHT UPPER LOBE, ROBOT-ASSISTED, USING VATS;  Surgeon: Kerrin Elspeth BROCKS, MD;  Location: [facility] OR;  Service: Thoracic;  Laterality: Right;   LYMPH NODE BIOPSY   05/09/2024   Procedure: LYMPH NODE BIOPSY;  Surgeon: Kerrin Elspeth BROCKS, MD;  Location: [facility] OR;  Service: Thoracic;;   REVERSE SHOULDER ARTHROPLASTY Right 08/31/2023   Procedure: RIGHT REVERSE SHOULDER ARTHROPLASTY;  Surgeon: [name], MD;  Location: [facility] OR;  Service: Orthopedics;  Laterality: Right;   SHOULDER SURGERY Right 04/09/2012   SPINE SURGERY     TOTAL KNEE ARTHROPLASTY Right 01/19/2021   Procedure: TOTAL KNEE ARTHROPLASTY - [place] Amber to Assist;  Surgeon: Kathlynn Sharper, MD;  Location: [facility] ORS;  Service: Orthopedics;  Laterality: Right;   WEDGE RESECTION, LUNG, ROBOT-ASSISTED, THORACOSCOPIC Right 05/09/2024   Procedure:  WEDGE RESECTION, LUNG RIGHT UPPER LOBE, ROBOT-ASSISTED, THORACOSCOPIC;  Surgeon: Kerrin Elspeth BROCKS, MD;  Location: [facility] OR;  Service: Thoracic;  Laterality: Right;     Social History:   reports that he quit smoking about 25 years ago. His smoking use included cigarettes. He started smoking about 45 years ago. He has a 63.8 pack-year smoking history. He has been exposed to tobacco smoke. He has never used smokeless tobacco. He reports that he does not currently use alcohol  after a past usage of about 3.0 standard drinks of alcohol  per week. He reports that he does not currently use drugs after having used the following drugs: Cocaine and Marijuana.   Family History:  His family history includes Depression in his mother; Early death in his father; Heart attack in his father; Heart disease in his father and mother; Hypertension in his father, mother, and sister; Osteoarthritis in his mother; Parkinson's disease in his maternal grandfather. There is no history of Prostate cancer, Bladder Cancer, Kidney cancer, or Tremor.   Allergies Allergies  Allergen Reactions   Asenapine Other (See Comments) and Nausea And Vomiting    Increased tremors   Dextromethorphan Hbr    Guaifenesin     Latuda [Lurasidone Hcl] Other (See Comments)    Tremors     Lurasidone  Other (See Comments) and Hives    Other Reaction(s): Angioedema   Phenylephrine       Home Medications  Prior to Admission medications   Medication Sig Start Date End Date Taking? Authorizing Provider  acetaminophen  (TYLENOL ) 325 MG tablet Take 1 tablet (325 mg total) by mouth every 6 (six) hours as needed for mild pain (pain score 1-3) (or temp > 100.5). 09/01/23  Yes [name], MD  albuterol  (VENTOLIN  HFA) 108 [phone] Base) MCG/ACT inhaler Inhale 2 puffs into the lungs every 6 (six) hours as needed for wheezing or shortness of breath. 12/19/23  Yes Lorren Greig PARAS, NP  aspirin  EC 81 MG tablet Take 1 tablet (81 mg total) by mouth daily. Swallow whole. 02/03/23  Yes Evelena, Nadir, MD  atorvastatin  (LIPITOR ) 80 MG tablet TAKE 1 TABLET BY MOUTH EVERY DAY 01/15/24  Yes [name], MD  budesonide -glycopyrrolate -formoterol  (BREZTRI  AEROSPHERE) 160-9-4.8 MCG/ACT AERO inhaler Inhale 2 puffs into the lungs in the morning and at bedtime. 12/19/23  Yes Lorren, Amy J, NP  celecoxib  (CELEBREX ) 100 MG capsule TAKE 2 CAPSULES BY MOUTH TWICE A DAY 04/25/24  Yes Georgina Ozell LABOR, MD  divalproex  (DEPAKOTE ) 500 MG DR tablet Take half tablet in the morning and 3 tablets at bedtime Patient taking differently: Take half tablet in the morning, 1 in the afternoon, and 2 tablets at bedtime 04/25/24  Yes Izella Ismael NOVAK, MD  DULoxetine  (CYMBALTA ) 30 MG capsule Take 3 capsules (90 mg total) by mouth daily. Patient taking differently: Take 30 mg by mouth 3 (three) times daily. 04/25/24 06/24/24 Yes Izella Ismael NOVAK, MD  gabapentin  (NEURONTIN ) 600 MG tablet Take 1 tablet (600 mg total) by mouth daily AND 2 tablets (1,200 mg total) at bedtime. 04/25/24  Yes Izella Ismael NOVAK, MD  hydrOXYzine  (ATARAX ) 25 MG tablet Take 1 tablet (25 mg total) by mouth 2 (two) times daily as needed. Patient taking differently: Take 25 mg by mouth daily. 04/25/24  Yes Izella Ismael NOVAK, MD  isosorbide  mononitrate (IMDUR ) 30 MG 24 hr tablet TAKE 1  TABLET BY MOUTH 2 TIMES DAILY. 04/09/24  Yes Fernand Denyse LABOR, MD  Multiple Vitamins-Minerals (MENS 50+ MULTIVITAMIN) TABS Take 1 tablet by  mouth in the morning.   Yes [provider]  nicotine  polacrilex (NICOTINE  MINI) 2 MG lozenge Take 1 lozenge (2 mg total) by mouth every 2 (two) hours as needed for smoking cessation. 03/26/24 06/24/24 Yes Dgayli, Belva, MD  nitroGLYCERIN  (NITROSTAT ) 0.4 MG SL tablet Place 1 tablet (0.4 mg total) under the tongue every 5 (five) minutes as needed for chest pain. 02/03/23 05/06/24 Yes Attiah, Nadir, MD  pantoprazole  (PROTONIX ) 40 MG tablet Take 1 tablet (40 mg total) by mouth daily. 12/19/23  Yes Lorren, Amy J, NP  QUEtiapine  (SEROQUEL ) 200 MG tablet Take 1 tablet (200 mg total) by mouth at bedtime. 04/25/24  Yes Izella Ismael NOVAK, MD  traZODone  (DESYREL ) 100 MG tablet Take 0.5 tablets (50 mg total) by mouth at bedtime for 7 days. Patient not taking: Reported on 05/06/2024 04/25/24 05/02/24  Izella Ismael NOVAK, MD           Lenny Drought, MD  [facility] Prefer epic messenger for cross cover needs   My critical care time: 32 minutes  Critical care time was exclusive of separately billable procedures and treating other patients.  Critical care was necessary to treat or prevent imminent or life-threatening deterioration.  Critical care was time spent personally by me on the following activities: development of treatment plan with patient and/or surrogate as well as nursing, discussions with consultants, evaluation of patient's response to treatment, examination of patient, obtaining history from patient or surrogate, ordering and performing treatments and interventions, ordering and review of laboratory studies, ordering and review of radiographic studies, pulse oximetry, re-evaluation of patient's condition and participation in multidisciplinary rounds.   05/17/2024, 8:10 AM

## 2024-05-18 ENCOUNTER — Inpatient Hospital Stay (HOSPITAL_COMMUNITY): Payer: MEDICAID

## 2024-05-18 DIAGNOSIS — J449 Chronic obstructive pulmonary disease, unspecified: Secondary | ICD-10-CM | POA: Diagnosis not present

## 2024-05-18 DIAGNOSIS — J9621 Acute and chronic respiratory failure with hypoxia: Secondary | ICD-10-CM | POA: Diagnosis not present

## 2024-05-18 DIAGNOSIS — J810 Acute pulmonary edema: Secondary | ICD-10-CM | POA: Diagnosis not present

## 2024-05-18 DIAGNOSIS — G9341 Metabolic encephalopathy: Secondary | ICD-10-CM | POA: Diagnosis not present

## 2024-05-18 LAB — POCT I-STAT 7, (LYTES, BLD GAS, ICA,H+H)
Acid-Base Excess: 6 mmol/L — ABNORMAL HIGH (ref 0.0–2.0)
Bicarbonate: 31.3 mmol/L — ABNORMAL HIGH (ref 20.0–28.0)
Calcium, Ion: 1.07 mmol/L — ABNORMAL LOW (ref 1.15–1.40)
HCT: 32 % — ABNORMAL LOW (ref 39.0–52.0)
Hemoglobin: 10.9 g/dL — ABNORMAL LOW (ref 13.0–17.0)
O2 Saturation: 79 %
Patient temperature: 99
Potassium: 4.1 mmol/L (ref 3.5–5.1)
Sodium: 131 mmol/L — ABNORMAL LOW (ref 135–145)
TCO2: 33 mmol/L — ABNORMAL HIGH (ref 22–32)
pCO2 arterial: 48.1 mmHg — ABNORMAL HIGH (ref 32–48)
pH, Arterial: 7.423 (ref 7.35–7.45)
pO2, Arterial: 44 mmHg — ABNORMAL LOW (ref 83–108)

## 2024-05-18 LAB — CBC
HCT: 31.1 % — ABNORMAL LOW (ref 39.0–52.0)
Hemoglobin: 10.7 g/dL — ABNORMAL LOW (ref 13.0–17.0)
MCH: 32.8 pg (ref 26.0–34.0)
MCHC: 34.4 g/dL (ref 30.0–36.0)
MCV: 95.4 fL (ref 80.0–100.0)
Platelets: 192 K/uL (ref 150–400)
RBC: 3.26 MIL/uL — ABNORMAL LOW (ref 4.22–5.81)
RDW: 13.7 % (ref 11.5–15.5)
WBC: 14.5 K/uL — ABNORMAL HIGH (ref 4.0–10.5)
nRBC: 0 % (ref 0.0–0.2)

## 2024-05-18 LAB — BASIC METABOLIC PANEL WITH GFR
Anion gap: 10 (ref 5–15)
BUN: 27 mg/dL — ABNORMAL HIGH (ref 8–23)
CO2: 28 mmol/L (ref 22–32)
Calcium: 7.5 mg/dL — ABNORMAL LOW (ref 8.9–10.3)
Chloride: 90 mmol/L — ABNORMAL LOW (ref 98–111)
Creatinine, Ser: 1.33 mg/dL — ABNORMAL HIGH (ref 0.61–1.24)
GFR, Estimated: >60 mL/min (ref >60)
Glucose, Bld: 104 mg/dL — ABNORMAL HIGH (ref 70–99)
Potassium: 4.3 mmol/L (ref 3.5–5.1)
Sodium: 128 mmol/L — ABNORMAL LOW (ref 135–145)

## 2024-05-18 LAB — GLUCOSE, CAPILLARY
Glucose-Capillary: 105 mg/dL — ABNORMAL HIGH (ref 70–99)
Glucose-Capillary: 118 mg/dL — ABNORMAL HIGH (ref 70–99)
Glucose-Capillary: 145 mg/dL — ABNORMAL HIGH (ref 70–99)
Glucose-Capillary: 137 mg/dL — ABNORMAL HIGH (ref 70–99)

## 2024-05-18 MED ORDER — VANCOMYCIN HCL 10 G IV SOLR
2250.0000 mg | Freq: Once | INTRAVENOUS | Status: AC
  Administered 2024-05-18: 2250 mg via INTRAVENOUS
  Filled 2024-05-18: qty 2250

## 2024-05-18 MED ORDER — MIDAZOLAM HCL 2 MG/2ML IJ SOLN
INTRAMUSCULAR | Status: DC
Start: 2024-05-18 — End: 2024-05-18
  Filled 2024-05-18: qty 2

## 2024-05-18 MED ORDER — DEXMEDETOMIDINE HCL IN NACL 400 MCG/100ML IV SOLN
INTRAVENOUS | Status: AC
  Administered 2024-05-18: 0.4 ug/kg/h via INTRAVENOUS
  Filled 2024-05-18: qty 100

## 2024-05-18 MED ORDER — SUCCINYLCHOLINE CHLORIDE 200 MG/10ML IV SOSY
PREFILLED_SYRINGE | INTRAVENOUS | Status: AC
  Filled 2024-05-18: qty 10

## 2024-05-18 MED ORDER — DEXMEDETOMIDINE HCL IN NACL 400 MCG/100ML IV SOLN
0.0000 ug/kg/h | INTRAVENOUS | Status: DC
  Administered 2024-05-18: 0.6 ug/kg/h via INTRAVENOUS
  Administered 2024-05-19: 0.9 ug/kg/h via INTRAVENOUS
  Administered 2024-05-19: 0.8 ug/kg/h via INTRAVENOUS
  Filled 2024-05-18 (×2): qty 100

## 2024-05-18 MED ORDER — VANCOMYCIN HCL 2000 MG/400ML IV SOLN
2000.0000 mg | INTRAVENOUS | Status: DC
  Administered 2024-05-19 – 2024-05-20 (×2): 2000 mg via INTRAVENOUS
  Filled 2024-05-18 (×2): qty 400

## 2024-05-18 MED ORDER — FENTANYL CITRATE (PF) 50 MCG/ML IJ SOSY
PREFILLED_SYRINGE | INTRAMUSCULAR | Status: AC
  Filled 2024-05-18: qty 2

## 2024-05-18 MED ORDER — ETOMIDATE 2 MG/ML IV SOLN
INTRAVENOUS | Status: AC
  Filled 2024-05-18: qty 20

## 2024-05-18 MED ORDER — ROCURONIUM BROMIDE 10 MG/ML (PF) SYRINGE
PREFILLED_SYRINGE | INTRAVENOUS | Status: AC
  Filled 2024-05-18: qty 10

## 2024-05-18 MED ORDER — KETAMINE HCL 50 MG/5ML IJ SOSY
PREFILLED_SYRINGE | INTRAMUSCULAR | Status: AC
  Filled 2024-05-18: qty 10

## 2024-05-18 NOTE — Progress Notes (Addendum)
 Pharmacy Antibiotic Note  Keith Gibbs is a 63 y.o. male admitted on 05/09/2024 with pneumonia.  Patient with worsening respiratory status. Pharmacy has been consulted for vancomycin  dosing. Patient started on Zosyn per MD on 05/17/24.   05/18/24: Scr 1.33, elevated from baseline on 0.96 yesterday. WBC increased to 14.5. Tmax 98.34F.   Plan: Vancomycin  2250 mg x1, then vancomycin  2000 mg every 24 hours - eAUC 526, Scr 1.33 Zosyn 3.375 gm IV every 8 hours Monitor vancomycin  levels at steady state Monitor renal function, clinical picture F/u LOT and de-escalate as appropriate  Height: 6' 3 (190.5 cm) Weight: 111.6 kg (246 lb 0.5 oz) IBW/kg (Calculated) : 84.5  Temp (24hrs), Avg:98.1 F (36.7 C), Min:97.1 F (36.2 C), Max:98.7 F (37.1 C)  Recent Labs  Lab 05/14/24 0550 05/15/24 0315 05/16/24 0748 05/17/24 0417 05/18/24 0244  WBC 9.1 10.6* 7.4 10.5 14.5*  CREATININE 1.03 1.23 0.91 0.96 1.33*    Estimated Creatinine Clearance: 76.6 mL/min (A) (by C-G formula based on SCr of 1.33 mg/dL (H)).    Allergies  Allergen Reactions   Asenapine Other (See Comments) and Nausea And Vomiting    Increased tremors   Dextromethorphan Hbr    Guaifenesin     Latuda [Lurasidone Hcl] Other (See Comments)    Tremors     Lurasidone Other (See Comments) and Hives    Other Reaction(s): Angioedema   Phenylephrine      Antimicrobials this admission: Zosyn 10/24 >>  Vancomycin  10/25 >>   Microbiology results: 10/21 MRSA PCR: negative  Thank you for allowing pharmacy to be a part of this patient's care.  Morna Breach, PharmD PGY2 Cardiology Pharmacy Resident 05/18/2024 2:04 PM

## 2024-05-18 NOTE — Progress Notes (Signed)
 Short Progress Note: Patient seen and evaluated SOB Tachypnic O2 sats 92% on 100% NRM and 100% HFNC Lungs: very course Chest tube in place and with large air leak  On Antibiotics and diuresis Now started on Precedex   A/P Acute hypoxic respiratory failure post RUL lobectomy with right chest tube ARDS/Pulmonary edema  -will trial BiPAP and then consider intubation -Stat cxr -Continue Precedex  on PAP CC time extra 30 min The patient is critically ill with multiple organ system failure and requires high complexity decision making for assessment and support, frequent evaluation and titration of therapies, advanced monitoring, review of radiographic studies and interpretation of complex data.   Critical Care Time devoted to patient care services, exclusive of separately billable procedures, described in this note is 30 minutes.   Orlin Fairly, MD [facility] See Amion for pager  If no response to pager , please call [phone] until 7pm After 7:00 pm call Elink  [phone] 05/18/2024, 8:09 PM

## 2024-05-18 NOTE — Progress Notes (Signed)
 9 Days Post-Op Procedure(s) (LRB): WEDGE RESECTION, LUNG RIGHT UPPER LOBE, ROBOT-ASSISTED, THORACOSCOPIC (Right) LOBECTOMY, LUNG RIGHT UPPER LOBE, ROBOT-ASSISTED, USING VATS (Right) LYMPH NODE BIOPSY BLOCK, NERVE, INTERCOSTAL Subjective: Stable on 60L HHFNC Slight cre bump today  Objective: Vital signs in last 24 hours: Temp:  [97.1 F (36.2 C)-99.2 F (37.3 C)] 97.1 F (36.2 C) (10/24 1500) Pulse Rate:  [66-100] 69 (10/25 0630) Cardiac Rhythm: Normal sinus rhythm (10/24 2000) Resp:  [15-44] 20 (10/25 0630) BP: (93-131)/(56-77) 109/67 (10/25 0600) SpO2:  [64 %-100 %] 91 % (10/25 0630) FiO2 (%):  [60 %-100 %] 100 % (10/25 0344)  Hemodynamic parameters for last 24 hours:    Intake/Output from previous day: 10/24 0701 - 10/25 0700 In: 110 [I.V.:10; IV Piggyback:100] Out: 1790 [Urine:1450; Chest Tube:340] Intake/Output this shift: Total I/O In: 79.2 [IV Piggyback:79.2] Out: 670 [Urine:550; Chest Tube:120]  General appearance: alert and cooperative Neurologic: intact Heart: S1, S2 normal Lungs: rhonchi diffusely and large air leak Abdomen: soft, ND/NT   Lab Results: Recent Labs    05/17/24 0417 05/17/24 1455 05/18/24 0244  WBC 10.5  --  14.5*  HGB 11.2* 10.9* 10.7*  HCT 32.6* 32.0* 31.1*  PLT 213  --  192   BMET:  Recent Labs    05/17/24 0417 05/17/24 1455 05/18/24 0244  NA 132* 129* 128*  K 3.9 4.1 4.3  CL 92*  --  90*  CO2 28  --  28  GLUCOSE 96  --  104*  BUN 22  --  27*  CREATININE 0.96  --  1.33*  CALCIUM  8.1*  --  7.5*    PT/INR: No results for input(s): LABPROT, INR in the last 72 hours. ABG    Component Value Date/Time   PHART 7.463 (H) 05/17/2024 1455   HCO3 28.7 (H) 05/17/2024 1455   TCO2 30 05/17/2024 1455   ACIDBASEDEF 1.2 05/14/2024 0034   O2SAT 85 05/17/2024 1455   CBG (last 3)  Recent Labs    05/17/24 0814 05/17/24 1105 05/17/24 1603  GLUCAP 134* 171* 161*    Assessment/Plan: S/P Procedure(s) (LRB): WEDGE RESECTION,  LUNG RIGHT UPPER LOBE, ROBOT-ASSISTED, THORACOSCOPIC (Right) LOBECTOMY, LUNG RIGHT UPPER LOBE, ROBOT-ASSISTED, USING VATS (Right) LYMPH NODE BIOPSY BLOCK, NERVE, INTERCOSTAL Pulmonary status remains tenuous   NEURO- intact  PSYCH- anxiety- on meds  CV- in SR, BP stable  RESP- acute on chronic respiratory failure On nebs, steroids and diuretics  1.7L negative yesterday without significant impact on O2 demand or CXR CXR still shows a right apical space + diffuse AS opacity bilaterally Still with large air leak On Zosyn empirically for possible pneumonia  RENAL- creatinine stable, diuresed well yesterday  K 3.9 - supplement  ENDO- CBG well controlled  Gi- tolerating diet  SCD + enoxaparin  for DVT prophylaxis   LOS: 9 days    Con RAMAN Ellenore Roscoe 05/18/2024

## 2024-05-18 NOTE — Progress Notes (Addendum)
 TCTS PM Rounding Progress Note  Sats mid to high 80s most of the day on 60L HHFNC.  PaO2 low on mid day ABG, but Sats improved later today.  Subjectively feels about the same  Vitals:   05/18/24 1433 05/18/24 1550  BP: (!) 116/54 (!) 109/59  Pulse: 78 73  Resp: (!) 21 15  Temp:    SpO2: (!) 82% 95%    Plan: - Continue to closely monitor oxygenation/ABG - I warned the patient and his sister that there is a chance he may need to be intubated.  They are in agreement with intubation and want to proceed with any needed interventions.  Keith Clunes, MD Cardiothoracic Surgery Pager: [phone]

## 2024-05-18 NOTE — Progress Notes (Addendum)
 NAME:  Keith Gibbs, MRN:  [id number], DOB:  1961-07-03, LOS: 9 ADMISSION DATE:  05/09/2024, CONSULTATION DATE: 05/14/2024 REFERRING MD: CTS, CHIEF COMPLAINT: Acute on chronic hypoxic respiratory failure  History of Present Illness:  63 yo male presented for lobectomy with CTS. Progressing as expected post operatively with CT in place and was on 4E when this morning he decompensated with worsening hypoxemia and increased wob. CTS transferred to ICU and requested CCM evaluation.     Pertinent  Medical History  RUL nodule s/p RUL lobectomy Bipolar 1 d/o Schizophrenia Polysubstance abuse Cad Mild to mod Ao insuff Htn Hyperlipidemia Tobacco use COPD Hep C  Significant Hospital Events: Including procedures, antibiotic start and stop dates in addition to other pertinent events   Admitted 10/16 for RUL Lobectomy Transferred to ICU 10/21   Interim History / Subjective:  still on high levels of HHFNC,conversational, tolerating diet Theres air leak in the chest tube and slight expansion on Right upper CXR-No obvious pneumothorax Will check abg, and co-ox,  Continuing diuresiing      Objective    Blood pressure 126/74, pulse 75, temperature (!) 97.1 F (36.2 C), temperature source Axillary, resp. rate (!) 32, height 6' 3 (1.905 m), weight 111.6 kg, SpO2 (!) 87%.    FiO2 (%):  [60 %-100 %] 100 %   Intake/Output Summary (Last 24 hours) at 05/18/2024 0738 Last data filed at 05/18/2024 0400 Gross per 24 hour  Intake 110.04 ml  Output 1790 ml  Net -1679.96 ml   Filed Weights   05/14/24 0215 05/16/24 0600 05/17/24 0600  Weight: 116.7 kg 113.4 kg 111.6 kg    Examination: General: Elderly male, anxious on HHFNC HENT: NCAT,PEARL Lungs: Symmetric chest wall movement, crackles on right, expiratory wheeze on left Cardiovascular: Regular rate and rhythm, no murmurs or gallops Abdomen: Soft nontender nondistended Extremities: No edema Neuro: No focal deficit, baseline  tremor GU: Deferred  Resolved problem list   Assessment and Plan   63 year old male who underwent right 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  Patient has been on diuretics, steroids, DuoNeb, and HHFNC Patient is also anxious exacerbating his hypoxia.  Sats are better when asleep> 95%, but in the high 80s when awake. We have adjusted meds to include: Seroquel  200 mg at bedtime, Yupelr daily, Brovana nebs BID, budesonide  BID, cut down Solu-Medrol  to 40 daily, Continue with: Xanax 0.25 mg PRN q8hrs, Atarax  10 mg TID,PRN, Depakote  250 mg AM, 500 mg with lunch and 1000 mg qhsz, duloxetine  30 mg  TID, gabapentin  600 mg daily & 1200 mg qhs LASIX 40 mg twice daily, DuoNeb Q4hrs, subacute Lovenox  40 mg, fentanyl  25 mcg q2hrs prn   Acute metabolic encephalopathy ICU delirium, in the setting of patient with a history of anxiety, and schizoaffective disorder bipolar type Acute on chronic hypoxic respiratory failure Acute COPD exacerbation Acute pulmonary edema Adeno CA of the lung s/p RUL lobectomy Persistent right pneumothorax s/p chest tube placement   Plan Still on high levels of heated high flow nasal cannula, will try to wean off to aim sats> 90% Continue broad spectrum antibiotics-Vanc, zosyn, if MRSA nares negative will d/c vanco Continue nebs, reducing steroids to 20 mg Solu-Medrol  Adjusting anxiety and psych meds,  Strict I&O's Monitor chest tube output Theres air leak in the chest tube and slight expansion on Right upper CXR-No obvious pneumothorax Will check abg, and  co-ox,  Continuing diuresiing Monitor and replace electrolytes     Labs   CBC: Recent Labs  Lab 05/14/24 0550 05/15/24 0315 05/16/24 0748 05/17/24 0417 05/17/24 1455 05/18/24 0244  WBC 9.1 10.6* 7.4 10.5  --  14.5*  HGB 10.8* 10.0* 11.0* 11.2* 10.9* 10.7*  HCT 30.5* 28.5* 33.1*  32.6* 32.0* 31.1*  MCV 94.1 95.3 100.0 96.2  --  95.4  PLT 161 163 180 213  --  192    Basic Metabolic Panel: Recent Labs  Lab 05/14/24 0550 05/15/24 0315 05/16/24 0748 05/17/24 0417 05/17/24 1455 05/18/24 0244  NA 130* 129* 131* 132* 129* 128*  K 4.0 4.5 4.1 3.9 4.1 4.3  CL 96* 95* 93* 92*  --  90*  CO2 24 24 24 28   --  28  GLUCOSE 127* 133* 102* 96  --  104*  BUN 17 31* 23 22  --  27*  CREATININE 1.03 1.23 0.91 0.96  --  1.33*  CALCIUM  8.1* 7.8* 8.2* 8.1*  --  7.5*  MG 1.8 2.6*  --   --   --   --    GFR: Estimated Creatinine Clearance: 76.6 mL/min (A) (by C-G formula based on SCr of 1.33 mg/dL (H)). Recent Labs  Lab 05/14/24 0809 05/15/24 0315 05/16/24 0748 05/17/24 0417 05/17/24 1535 05/18/24 0244  PROCALCITON 0.41  --   --   --  0.59  --   WBC  --  10.6* 7.4 10.5  --  14.5*    Liver Function Tests: No results for input(s): AST, ALT, ALKPHOS, BILITOT, PROT, ALBUMIN  in the last 168 hours.  No results for input(s): LIPASE, AMYLASE in the last 168 hours. No results for input(s): AMMONIA in the last 168 hours.  ABG    Component Value Date/Time   PHART 7.463 (H) 05/17/2024 1455   PCO2ART 40.2 05/17/2024 1455   PO2ART 48 (L) 05/17/2024 1455   HCO3 28.7 (H) 05/17/2024 1455   TCO2 30 05/17/2024 1455   ACIDBASEDEF 1.2 05/14/2024 0034   O2SAT 85 05/17/2024 1455     Coagulation Profile: No results for input(s): INR, PROTIME in the last 168 hours.  Cardiac Enzymes: No results for input(s): CKTOTAL, CKMB, CKMBINDEX, TROPONINI in the last 168 hours.  HbA1C: Hgb A1c MFr Bld  Date/Time Value Ref Range Status  02/12/2024 08:40 AM 5.3 4.8 - 5.6 % Final    Comment:             Prediabetes: 5.7 - 6.4          Diabetes: >6.4          Glycemic control for adults with diabetes: <7.0   04/26/2022 03:30 PM 5.2 4.8 - 5.6 % Final    Comment:    (NOTE) Pre diabetes:          5.7%-6.4%  Diabetes:              >6.4%  Glycemic control  for   <7.0% adults with diabetes     CBG: Recent Labs  Lab 05/16/24 2121 05/17/24 0814 05/17/24 1105 05/17/24 1603 05/18/24 0735  GLUCAP 123* 134* 171* 161* 105*    Review of Systems:   Negative except above  Past Medical History:  He,  has a past medical history of Anginal pain, Anxiety, Arthritis, Bipolar 1 disorder (HCC), Bursitis, CAD (coronary artery disease), Cancer (HCC), Chronic pain, COPD (chronic obstructive pulmonary disease) (HCC), Current use of long term anticoagulation, Depression, Diverticulitis, Dyspnea, GERD (gastroesophageal reflux disease), Grade I  diastolic dysfunction, Hepatitis C (2012), HLD (hyperlipidemia), Hypertension, MI (myocardial infarction) (HCC), Polysubstance abuse (HCC), PUD (peptic ulcer disease), S/P angioplasty with stent (06/10/2016), S/P PTCA (percutaneous transluminal coronary angioplasty) (12/04/2019), Schizophrenia (HCC), Stroke (HCC), Tremors, and Valvular insufficiency.   Surgical History:   Past Surgical History:  Procedure Laterality Date   ABDOMINAL SURGERY     removed small piece of intestines due to [facility] Diverticulosis   ANTERIOR LAT LUMBAR FUSION N/A 08/23/2022   Procedure: DIRECT LATERAL INTERBODY FUSION  LUMBAR TWO- LUMBAR THREE, LUMBAR THREE-LUMBAR FOUR , EXPLORE AND EXTEND FUSION LUMBAR TWO-LUMBAR FIVE, POSTERIOR DECOMPRESSION LUMBAR THREE-LUMBAR FOUR, LEFT LUMBAR TWO-LUMBAR THREE;  Surgeon: Debby Dorn MATSU, MD;  Location: [facility] OR;  Service: Neurosurgery;  Laterality: N/A;   APPENDECTOMY     BACK SURGERY     CARDIAC CATHETERIZATION Left 06/10/2016   Procedure: Left Heart Cath and Coronary Angiography;  Surgeon: Denyse DELENA Bathe, MD;  Location: [facility] INVASIVE CV LAB;  Service: Cardiovascular;  Laterality: Left;   CARDIAC CATHETERIZATION N/A 06/10/2016   Procedure: Coronary Stent Intervention;  Surgeon: Cara JONETTA Lovelace, MD;  Location: [facility] INVASIVE CV LAB;  Service: Cardiovascular;  Laterality: N/A;   CHOLECYSTECTOMY N/A  02/17/2022   Procedure: LAPAROSCOPIC CHOLECYSTECTOMY;  Surgeon: Stevie Herlene Righter, MD;  Location: [facility] OR;  Service: General;  Laterality: N/A;   COLON SURGERY     COLONOSCOPY     COLONOSCOPY WITH PROPOFOL  N/A 01/05/2017   Procedure: COLONOSCOPY WITH PROPOFOL ;  Surgeon: Therisa Bi, MD;  Location: [facility] ENDOSCOPY;  Service: Endoscopy;  Laterality: N/A;   COLONOSCOPY WITH PROPOFOL  N/A 02/13/2020   Procedure: COLONOSCOPY WITH PROPOFOL ;  Surgeon: Janalyn Keene NOVAK, MD;  Location: [facility] ENDOSCOPY;  Service: Endoscopy;  Laterality: N/A;   CORONARY ANGIOPLASTY WITH STENT PLACEMENT     CORONARY PRESSURE/FFR STUDY N/A 12/04/2019   Procedure: INTRAVASCULAR PRESSURE WIRE/FFR STUDY;  Surgeon: Wonda Sharper, MD;  Location: [facility] INVASIVE CV LAB;  Service: Cardiovascular;  Laterality: N/A;   ESOPHAGOGASTRODUODENOSCOPY (EGD) WITH PROPOFOL  N/A 01/05/2017   Procedure: ESOPHAGOGASTRODUODENOSCOPY (EGD) WITH PROPOFOL ;  Surgeon: Therisa Bi, MD;  Location: [facility] ENDOSCOPY;  Service: Endoscopy;  Laterality: N/A;   ESOPHAGOGASTRODUODENOSCOPY (EGD) WITH PROPOFOL  N/A 02/13/2020   Procedure: ESOPHAGOGASTRODUODENOSCOPY (EGD) WITH PROPOFOL ;  Surgeon: Janalyn Keene NOVAK, MD;  Location: [facility] ENDOSCOPY;  Service: Endoscopy;  Laterality: N/A;   INTERCOSTAL NERVE BLOCK  05/09/2024   Procedure: BLOCK, NERVE, INTERCOSTAL;  Surgeon: Kerrin Elspeth BROCKS, MD;  Location[facility] OR;  Service: Thoracic;;   KNEE ARTHROSCOPY WITH MEDIAL MENISECTOMY Right 09/05/2017   Procedure: KNEE ARTHROSCOPY WITH MEDIAL AND LATERAL  MENISECTOMY PARTIAL SYNOVECTOMY;  Surgeon: Kathlynn Sharper, MD;  Location: [facility] ORS;  Service: Orthopedics;  Laterality: Right;   LEFT HEART CATH AND CORONARY ANGIOGRAPHY N/A 12/04/2019   Procedure: LEFT HEART CATH AND CORONARY ANGIOGRAPHY;  Surgeon: Wonda Sharper, MD;  Location[facility] INVASIVE CV LAB;  Service: Cardiovascular;  Laterality: N/A;   LEFT HEART CATH AND CORONARY ANGIOGRAPHY Left 11/25/2022   Procedure: LEFT  HEART CATH AND CORONARY ANGIOGRAPHY;  Surgeon: Bathe Denyse DELENA, MD;  Location: [facility] INVASIVE CV LAB;  Service: Cardiovascular;  Laterality: Left;   LOBECTOMY, LUNG, ROBOT-ASSISTED, USING VATS Right 05/09/2024   Procedure: LOBECTOMY, LUNG RIGHT UPPER LOBE, ROBOT-ASSISTED, USING VATS;  Surgeon: Kerrin Elspeth BROCKS, MD;  Location: [facility] OR;  Service: Thoracic;  Laterality: Right;   LYMPH NODE BIOPSY  05/09/2024   Procedure: LYMPH NODE BIOPSY;  Surgeon: Kerrin Elspeth BROCKS, MD;  Location: [facility] OR;  Service: Thoracic;;   REVERSE SHOULDER ARTHROPLASTY Right  08/31/2023   Procedure: RIGHT REVERSE SHOULDER ARTHROPLASTY;  Surgeon: [name], MD;  Location: [facility] OR;  Service: Orthopedics;  Laterality: Right;   SHOULDER SURGERY Right 04/09/2012   SPINE SURGERY     TOTAL KNEE ARTHROPLASTY Right 01/19/2021   Procedure: TOTAL KNEE ARTHROPLASTY - [place] Amber to Assist;  Surgeon: Kathlynn Sharper, MD;  Location: [facility] ORS;  Service: Orthopedics;  Laterality: Right;   WEDGE RESECTION, LUNG, ROBOT-ASSISTED, THORACOSCOPIC Right 05/09/2024   Procedure: WEDGE RESECTION, LUNG RIGHT UPPER LOBE, ROBOT-ASSISTED, THORACOSCOPIC;  Surgeon: Kerrin Elspeth BROCKS, MD;  Location: [facility] OR;  Service: Thoracic;  Laterality: Right;     Social History:   reports that he quit smoking about 25 years ago. His smoking use included cigarettes. He started smoking about 45 years ago. He has a 63.8 pack-year smoking history. He has been exposed to tobacco smoke. He has never used smokeless tobacco. He reports that he does not currently use alcohol  after a past usage of about 3.0 standard drinks of alcohol  per week. He reports that he does not currently use drugs after having used the following drugs: Cocaine and Marijuana.   Family History:  His family history includes Depression in his mother; Early death in his father; Heart attack in his father; Heart disease in his father and mother; Hypertension in his father, mother, and sister;  Osteoarthritis in his mother; Parkinson's disease in his maternal grandfather. There is no history of Prostate cancer, Bladder Cancer, Kidney cancer, or Tremor.   Allergies Allergies  Allergen Reactions   Asenapine Other (See Comments) and Nausea And Vomiting    Increased tremors   Dextromethorphan Hbr    Guaifenesin     Latuda [Lurasidone Hcl] Other (See Comments)    Tremors     Lurasidone Other (See Comments) and Hives    Other Reaction(s): Angioedema   Phenylephrine       Home Medications  Prior to Admission medications   Medication Sig Start Date End Date Taking? Authorizing Provider  acetaminophen  (TYLENOL ) 325 MG tablet Take 1 tablet (325 mg total) by mouth every 6 (six) hours as needed for mild pain (pain score 1-3) (or temp > 100.5). 09/01/23  Yes [name], MD  albuterol  (VENTOLIN  HFA) 108 [phone] Base) MCG/ACT inhaler Inhale 2 puffs into the lungs every 6 (six) hours as needed for wheezing or shortness of breath. 12/19/23  Yes Lorren Greig PARAS, NP  aspirin  EC 81 MG tablet Take 1 tablet (81 mg total) by mouth daily. Swallow whole. 02/03/23  Yes Evelena, Nadir, MD  atorvastatin  (LIPITOR ) 80 MG tablet TAKE 1 TABLET BY MOUTH EVERY DAY 01/15/24  Yes [name], MD  budesonide -glycopyrrolate -formoterol  (BREZTRI  AEROSPHERE) 160-9-4.8 MCG/ACT AERO inhaler Inhale 2 puffs into the lungs in the morning and at bedtime. 12/19/23  Yes Lorren, Amy J, NP  celecoxib  (CELEBREX ) 100 MG capsule TAKE 2 CAPSULES BY MOUTH TWICE A DAY 04/25/24  Yes Georgina Sharper LABOR, MD  divalproex  (DEPAKOTE ) 500 MG DR tablet Take half tablet in the morning and 3 tablets at bedtime Patient taking differently: Take half tablet in the morning, 1 in the afternoon, and 2 tablets at bedtime 04/25/24  Yes Izella Ismael NOVAK, MD  DULoxetine  (CYMBALTA ) 30 MG capsule Take 3 capsules (90 mg total) by mouth daily. Patient taking differently: Take 30 mg by mouth 3 (three) times daily. 04/25/24 06/24/24 Yes Izella Ismael NOVAK, MD   gabapentin  (NEURONTIN ) 600 MG tablet Take 1 tablet (600 mg total) by mouth daily AND 2 tablets (1,200 mg  total) at bedtime. 04/25/24  Yes Izella Ismael NOVAK, MD  hydrOXYzine  (ATARAX ) 25 MG tablet Take 1 tablet (25 mg total) by mouth 2 (two) times daily as needed. Patient taking differently: Take 25 mg by mouth daily. 04/25/24  Yes Izella Ismael NOVAK, MD  isosorbide  mononitrate (IMDUR ) 30 MG 24 hr tablet TAKE 1 TABLET BY MOUTH 2 TIMES DAILY. 04/09/24  Yes Fernand Denyse LABOR, MD  Multiple Vitamins-Minerals (MENS 50+ MULTIVITAMIN) TABS Take 1 tablet by mouth in the morning.   Yes [provider]  nicotine  polacrilex (NICOTINE  MINI) 2 MG lozenge Take 1 lozenge (2 mg total) by mouth every 2 (two) hours as needed for smoking cessation. 03/26/24 06/24/24 Yes Dgayli, Belva, MD  nitroGLYCERIN  (NITROSTAT ) 0.4 MG SL tablet Place 1 tablet (0.4 mg total) under the tongue every 5 (five) minutes as needed for chest pain. 02/03/23 05/06/24 Yes Evelena Figures, MD  pantoprazole  (PROTONIX ) 40 MG tablet Take 1 tablet (40 mg total) by mouth daily. 12/19/23  Yes Lorren, Amy J, NP  QUEtiapine  (SEROQUEL ) 200 MG tablet Take 1 tablet (200 mg total) by mouth at bedtime. 04/25/24  Yes Izella Ismael NOVAK, MD  traZODone  (DESYREL ) 100 MG tablet Take 0.5 tablets (50 mg total) by mouth at bedtime for 7 days. Patient not taking: Reported on 05/06/2024 04/25/24 05/02/24  Izella Ismael NOVAK, MD           Lenny Drought, MD  [facility] Prefer epic messenger for cross cover needs   My critical care time: 33 minutes  Critical care time was exclusive of separately billable procedures and treating other patients.  Critical care was necessary to treat or prevent imminent or life-threatening deterioration.  Critical care was time spent personally by me on the following activities: development of treatment plan with patient and/or surrogate as well as nursing, discussions with consultants, evaluation of patient's  response to treatment, examination of patient, obtaining history from patient or surrogate, ordering and performing treatments and interventions, ordering and review of laboratory studies, ordering and review of radiographic studies, pulse oximetry, re-evaluation of patient's condition and participation in multidisciplinary rounds.   05/18/2024, 7:38 AM

## 2024-05-19 ENCOUNTER — Inpatient Hospital Stay (HOSPITAL_COMMUNITY): Payer: MEDICAID

## 2024-05-19 DIAGNOSIS — J9621 Acute and chronic respiratory failure with hypoxia: Secondary | ICD-10-CM | POA: Diagnosis not present

## 2024-05-19 DIAGNOSIS — G9341 Metabolic encephalopathy: Secondary | ICD-10-CM | POA: Diagnosis not present

## 2024-05-19 DIAGNOSIS — J449 Chronic obstructive pulmonary disease, unspecified: Secondary | ICD-10-CM | POA: Diagnosis not present

## 2024-05-19 DIAGNOSIS — Z902 Acquired absence of lung [part of]: Secondary | ICD-10-CM | POA: Diagnosis not present

## 2024-05-19 DIAGNOSIS — J810 Acute pulmonary edema: Secondary | ICD-10-CM | POA: Diagnosis not present

## 2024-05-19 LAB — POCT I-STAT 7, (LYTES, BLD GAS, ICA,H+H)
Acid-Base Excess: 5 mmol/L — ABNORMAL HIGH (ref 0.0–2.0)
Acid-Base Excess: 1 mmol/L (ref 0.0–2.0)
Acid-Base Excess: 3 mmol/L — ABNORMAL HIGH (ref 0.0–2.0)
Acid-Base Excess: 2 mmol/L (ref 0.0–2.0)
Acid-Base Excess: 2 mmol/L (ref 0.0–2.0)
Acid-base deficit: 1 mmol/L (ref 0.0–2.0)
Bicarbonate: 29.7 mmol/L — ABNORMAL HIGH (ref 20.0–28.0)
Bicarbonate: 30.3 mmol/L — ABNORMAL HIGH (ref 20.0–28.0)
Bicarbonate: 30.8 mmol/L — ABNORMAL HIGH (ref 20.0–28.0)
Bicarbonate: 31.3 mmol/L — ABNORMAL HIGH (ref 20.0–28.0)
Bicarbonate: 30.3 mmol/L — ABNORMAL HIGH (ref 20.0–28.0)
Bicarbonate: 28.8 mmol/L — ABNORMAL HIGH (ref 20.0–28.0)
Calcium, Ion: 1.04 mmol/L — ABNORMAL LOW (ref 1.15–1.40)
Calcium, Ion: 1.05 mmol/L — ABNORMAL LOW (ref 1.15–1.40)
Calcium, Ion: 1.03 mmol/L — ABNORMAL LOW (ref 1.15–1.40)
Calcium, Ion: 1.03 mmol/L — ABNORMAL LOW (ref 1.15–1.40)
Calcium, Ion: 1.05 mmol/L — ABNORMAL LOW (ref 1.15–1.40)
Calcium, Ion: 1 mmol/L — ABNORMAL LOW (ref 1.15–1.40)
HCT: 30 % — ABNORMAL LOW (ref 39.0–52.0)
HCT: 37 % — ABNORMAL LOW (ref 39.0–52.0)
HCT: 36 % — ABNORMAL LOW (ref 39.0–52.0)
HCT: 35 % — ABNORMAL LOW (ref 39.0–52.0)
HCT: 34 % — ABNORMAL LOW (ref 39.0–52.0)
HCT: 34 % — ABNORMAL LOW (ref 39.0–52.0)
Hemoglobin: 10.2 g/dL — ABNORMAL LOW (ref 13.0–17.0)
Hemoglobin: 12.6 g/dL — ABNORMAL LOW (ref 13.0–17.0)
Hemoglobin: 12.2 g/dL — ABNORMAL LOW (ref 13.0–17.0)
Hemoglobin: 11.9 g/dL — ABNORMAL LOW (ref 13.0–17.0)
Hemoglobin: 11.6 g/dL — ABNORMAL LOW (ref 13.0–17.0)
Hemoglobin: 11.6 g/dL — ABNORMAL LOW (ref 13.0–17.0)
O2 Saturation: 85 %
O2 Saturation: 89 %
O2 Saturation: 96 %
O2 Saturation: 99 %
O2 Saturation: 94 %
O2 Saturation: 98 %
Patient temperature: 98.7
Patient temperature: 37.8
Patient temperature: 38.7
Patient temperature: 38.5
Patient temperature: 39.2
Patient temperature: 38.5
Potassium: 4.6 mmol/L (ref 3.5–5.1)
Potassium: 5.3 mmol/L — ABNORMAL HIGH (ref 3.5–5.1)
Potassium: 5.2 mmol/L — ABNORMAL HIGH (ref 3.5–5.1)
Potassium: 5.3 mmol/L — ABNORMAL HIGH (ref 3.5–5.1)
Potassium: 5.4 mmol/L — ABNORMAL HIGH (ref 3.5–5.1)
Potassium: 5.3 mmol/L — ABNORMAL HIGH (ref 3.5–5.1)
Sodium: 131 mmol/L — ABNORMAL LOW (ref 135–145)
Sodium: 133 mmol/L — ABNORMAL LOW (ref 135–145)
Sodium: 131 mmol/L — ABNORMAL LOW (ref 135–145)
Sodium: 131 mmol/L — ABNORMAL LOW (ref 135–145)
Sodium: 132 mmol/L — ABNORMAL LOW (ref 135–145)
Sodium: 131 mmol/L — ABNORMAL LOW (ref 135–145)
TCO2: 31 mmol/L (ref 22–32)
TCO2: 33 mmol/L — ABNORMAL HIGH (ref 22–32)
TCO2: 33 mmol/L — ABNORMAL HIGH (ref 22–32)
TCO2: 33 mmol/L — ABNORMAL HIGH (ref 22–32)
TCO2: 32 mmol/L (ref 22–32)
TCO2: 30 mmol/L (ref 22–32)
pCO2 arterial: 43.5 mmHg (ref 32–48)
pCO2 arterial: 98.5 mmHg (ref 32–48)
pCO2 arterial: 84.2 mmHg (ref 32–48)
pCO2 arterial: 69.7 mmHg (ref 32–48)
pCO2 arterial: 70 mmHg (ref 32–48)
pCO2 arterial: 56.3 mmHg — ABNORMAL HIGH (ref 32–48)
pH, Arterial: 7.444 (ref 7.35–7.45)
pH, Arterial: 7.101 — CL (ref 7.35–7.45)
pH, Arterial: 7.181 — CL (ref 7.35–7.45)
pH, Arterial: 7.268 — ABNORMAL LOW (ref 7.35–7.45)
pH, Arterial: 7.256 — ABNORMAL LOW (ref 7.35–7.45)
pH, Arterial: 7.324 — ABNORMAL LOW (ref 7.35–7.45)
pO2, Arterial: 49 mmHg — ABNORMAL LOW (ref 83–108)
pO2, Arterial: 82 mmHg — ABNORMAL LOW (ref 83–108)
pO2, Arterial: 112 mmHg — ABNORMAL HIGH (ref 83–108)
pO2, Arterial: 188 mmHg — ABNORMAL HIGH (ref 83–108)
pO2, Arterial: 94 mmHg (ref 83–108)
pO2, Arterial: 125 mmHg — ABNORMAL HIGH (ref 83–108)

## 2024-05-19 LAB — URINALYSIS, W/ REFLEX TO CULTURE (INFECTION SUSPECTED)
Bilirubin Urine: NEGATIVE
Glucose, UA: NEGATIVE mg/dL
Ketones, ur: NEGATIVE mg/dL
Leukocytes,Ua: NEGATIVE
Nitrite: NEGATIVE
Protein, ur: 30 mg/dL — AB
Specific Gravity, Urine: 1.015 (ref 1.005–1.030)
pH: 5 (ref 5.0–8.0)

## 2024-05-19 LAB — GLUCOSE, CAPILLARY
Glucose-Capillary: 122 mg/dL — ABNORMAL HIGH (ref 70–99)
Glucose-Capillary: 134 mg/dL — ABNORMAL HIGH (ref 70–99)
Glucose-Capillary: 117 mg/dL — ABNORMAL HIGH (ref 70–99)
Glucose-Capillary: 144 mg/dL — ABNORMAL HIGH (ref 70–99)
Glucose-Capillary: 125 mg/dL — ABNORMAL HIGH (ref 70–99)
Glucose-Capillary: 171 mg/dL — ABNORMAL HIGH (ref 70–99)

## 2024-05-19 LAB — BASIC METABOLIC PANEL WITH GFR
Anion gap: 14 (ref 5–15)
BUN: 27 mg/dL — ABNORMAL HIGH (ref 8–23)
CO2: 26 mmol/L (ref 22–32)
Calcium: 7.6 mg/dL — ABNORMAL LOW (ref 8.9–10.3)
Chloride: 91 mmol/L — ABNORMAL LOW (ref 98–111)
Creatinine, Ser: 1.22 mg/dL (ref 0.61–1.24)
GFR, Estimated: >60 mL/min (ref >60)
Glucose, Bld: 121 mg/dL — ABNORMAL HIGH (ref 70–99)
Potassium: 4.6 mmol/L (ref 3.5–5.1)
Sodium: 131 mmol/L — ABNORMAL LOW (ref 135–145)

## 2024-05-19 LAB — CBC WITH DIFFERENTIAL/PLATELET
Abs Immature Granulocytes: 0.67 K/uL — ABNORMAL HIGH (ref 0.00–0.07)
Basophils Absolute: 0.1 K/uL (ref 0.0–0.1)
Basophils Relative: 1 %
Eosinophils Absolute: 1.4 K/uL — ABNORMAL HIGH (ref 0.0–0.5)
Eosinophils Relative: 10 %
HCT: 30.1 % — ABNORMAL LOW (ref 39.0–52.0)
Hemoglobin: 10.3 g/dL — ABNORMAL LOW (ref 13.0–17.0)
Immature Granulocytes: 5 %
Lymphocytes Relative: 6 %
Lymphs Abs: 0.9 K/uL (ref 0.7–4.0)
MCH: 32.6 pg (ref 26.0–34.0)
MCHC: 34.2 g/dL (ref 30.0–36.0)
MCV: 95.3 fL (ref 80.0–100.0)
Monocytes Absolute: 0.5 K/uL (ref 0.1–1.0)
Monocytes Relative: 4 %
Neutro Abs: 10.9 K/uL — ABNORMAL HIGH (ref 1.7–7.7)
Neutrophils Relative %: 74 %
Platelets: 174 K/uL (ref 150–400)
RBC: 3.16 MIL/uL — ABNORMAL LOW (ref 4.22–5.81)
RDW: 13.7 % (ref 11.5–15.5)
WBC: 14.5 K/uL — ABNORMAL HIGH (ref 4.0–10.5)
nRBC: 0 % (ref 0.0–0.2)

## 2024-05-19 LAB — VITAMIN B1: Vitamin B1 (Thiamine): 95.8 nmol/L (ref 66.5–200.0)

## 2024-05-19 MED ORDER — PROPOFOL 1000 MG/100ML IV EMUL
0.0000 ug/kg/min | INTRAVENOUS | Status: DC
  Administered 2024-05-19: 70 ug/kg/min via INTRAVENOUS
  Administered 2024-05-20 (×4): 20 ug/kg/min via INTRAVENOUS
  Administered 2024-05-21 (×4): 30 ug/kg/min via INTRAVENOUS
  Administered 2024-05-21: 25 ug/kg/min via INTRAVENOUS
  Administered 2024-05-22: 30 ug/kg/min via INTRAVENOUS
  Administered 2024-05-22: 50 ug/kg/min via INTRAVENOUS
  Filled 2024-05-19 (×14): qty 100

## 2024-05-19 MED ORDER — DEXAMETHASONE SOD PHOSPHATE PF 10 MG/ML IJ SOLN
20.0000 mg | INTRAMUSCULAR | Status: AC
  Administered 2024-05-19 – 2024-05-23 (×5): 20 mg via INTRAVENOUS

## 2024-05-19 MED ORDER — SODIUM CHLORIDE 0.9 % IV SOLN
2.0000 g | Freq: Three times a day (TID) | INTRAVENOUS | Status: DC
  Administered 2024-05-19: 2 g via INTRAVENOUS
  Filled 2024-05-19: qty 12.5

## 2024-05-19 MED ORDER — ROCURONIUM BROMIDE 10 MG/ML (PF) SYRINGE
PREFILLED_SYRINGE | INTRAVENOUS | Status: AC
  Administered 2024-05-19: 100 mg
  Filled 2024-05-19: qty 10

## 2024-05-19 MED ORDER — VASOPRESSIN 20 UNITS/100 ML INFUSION FOR SHOCK
0.0000 [IU]/min | INTRAVENOUS | Status: DC
  Administered 2024-05-19 – 2024-05-20 (×4): 0.03 [IU]/min via INTRAVENOUS
  Filled 2024-05-19 (×3): qty 100

## 2024-05-19 MED ORDER — KETAMINE HCL 50 MG/5ML IJ SOSY
PREFILLED_SYRINGE | INTRAMUSCULAR | Status: AC
  Filled 2024-05-19: qty 10

## 2024-05-19 MED ORDER — MIDAZOLAM HCL 2 MG/2ML IJ SOLN
INTRAMUSCULAR | Status: AC
Start: 2024-05-19 — End: 2024-05-19
  Filled 2024-05-19: qty 2

## 2024-05-19 MED ORDER — PROPOFOL 1000 MG/100ML IV EMUL
INTRAVENOUS | Status: AC
  Administered 2024-05-19: 20 ug/kg/min via INTRAVENOUS
  Filled 2024-05-19: qty 100

## 2024-05-19 MED ORDER — FENTANYL CITRATE (PF) 50 MCG/ML IJ SOSY: 25.0000 ug | PREFILLED_SYRINGE | Freq: Once | INTRAMUSCULAR | Status: DC

## 2024-05-19 MED ORDER — FENTANYL CITRATE (PF) 50 MCG/ML IJ SOSY
PREFILLED_SYRINGE | INTRAMUSCULAR | Status: AC
  Filled 2024-05-19: qty 2

## 2024-05-19 MED ORDER — DOCUSATE SODIUM 50 MG/5ML PO LIQD
100.0000 mg | Freq: Two times a day (BID) | ORAL | Status: DC
  Administered 2024-05-19 – 2024-05-21 (×6): 100 mg
  Filled 2024-05-19 (×6): qty 10

## 2024-05-19 MED ORDER — ETOMIDATE 2 MG/ML IV SOLN: 30.0000 mg | Freq: Once | INTRAVENOUS | Status: AC

## 2024-05-19 MED ORDER — HEPARIN (PORCINE) 25000 UT/250ML-% IV SOLN
1700.0000 [IU]/h | INTRAVENOUS | Status: DC
  Administered 2024-05-19 – 2024-05-21 (×3): 1500 [IU]/h via INTRAVENOUS
  Administered 2024-05-22: 1550 [IU]/h via INTRAVENOUS
  Filled 2024-05-19 (×5): qty 250

## 2024-05-19 MED ORDER — LORAZEPAM 2 MG/ML IJ SOLN
1.0000 mg | INTRAMUSCULAR | Status: DC | PRN
  Administered 2024-05-19 – 2024-05-20 (×2): 1 mg via INTRAVENOUS
  Filled 2024-05-19 (×2): qty 1

## 2024-05-19 MED ORDER — SUCCINYLCHOLINE CHLORIDE 200 MG/10ML IV SOSY: 100.0000 mg | PREFILLED_SYRINGE | Freq: Once | INTRAVENOUS | Status: AC

## 2024-05-19 MED ORDER — SODIUM CHLORIDE 0.9 % IV SOLN
1.0000 g | Freq: Three times a day (TID) | INTRAVENOUS | Status: DC
  Administered 2024-05-19 – 2024-05-20 (×3): 1 g via INTRAVENOUS
  Filled 2024-05-19 (×3): qty 20

## 2024-05-19 MED ORDER — GABAPENTIN 300 MG PO CAPS: 1200.0000 mg | ORAL_CAPSULE | Freq: Every day | ORAL | Status: DC

## 2024-05-19 MED ORDER — HYDROXYZINE HCL 10 MG PO TABS
10.0000 mg | ORAL_TABLET | Freq: Three times a day (TID) | ORAL | Status: DC | PRN
Start: 2024-05-19 — End: 2024-05-27
  Administered 2024-05-19 – 2024-05-25 (×3): 10 mg
  Filled 2024-05-19 (×3): qty 1

## 2024-05-19 MED ORDER — FENTANYL BOLUS VIA INFUSION
25.0000 ug | INTRAVENOUS | Status: DC | PRN
  Administered 2024-05-20: 50 ug via INTRAVENOUS
  Administered 2024-05-20: 25 ug via INTRAVENOUS
  Administered 2024-05-20 (×2): 50 ug via INTRAVENOUS
  Administered 2024-05-20: 25 ug via INTRAVENOUS
  Administered 2024-05-20 (×2): 50 ug via INTRAVENOUS
  Administered 2024-05-20 (×2): 25 ug via INTRAVENOUS
  Administered 2024-05-21: 100 ug via INTRAVENOUS
  Administered 2024-05-21: 50 ug via INTRAVENOUS
  Administered 2024-05-21 – 2024-05-23 (×4): 100 ug via INTRAVENOUS

## 2024-05-19 MED ORDER — CELECOXIB 200 MG PO CAPS
200.0000 mg | ORAL_CAPSULE | Freq: Two times a day (BID) | ORAL | Status: DC
  Administered 2024-05-19 (×2): 200 mg
  Filled 2024-05-19 (×2): qty 1

## 2024-05-19 MED ORDER — GABAPENTIN 300 MG PO CAPS
600.0000 mg | ORAL_CAPSULE | Freq: Every day | ORAL | Status: DC
Start: 2024-05-19 — End: 2024-05-20
  Administered 2024-05-19: 600 mg
  Filled 2024-05-19: qty 6

## 2024-05-19 MED ORDER — SUCCINYLCHOLINE CHLORIDE 200 MG/10ML IV SOSY
PREFILLED_SYRINGE | INTRAVENOUS | Status: AC
  Administered 2024-05-19: 100 mg via INTRAVENOUS
  Filled 2024-05-19: qty 10

## 2024-05-19 MED ORDER — VALPROATE SODIUM 100 MG/ML IV SOLN
600.0000 mg | Freq: Three times a day (TID) | INTRAVENOUS | Status: DC
  Filled 2024-05-19 (×4): qty 6

## 2024-05-19 MED ORDER — FLUDROCORTISONE ACETATE 0.1 MG PO TABS
0.0500 mg | ORAL_TABLET | Freq: Every day | ORAL | Status: DC
  Administered 2024-05-19 – 2024-05-20 (×2): 0.05 mg
  Filled 2024-05-19 (×3): qty 0.5

## 2024-05-19 MED ORDER — SODIUM CHLORIDE 0.9 % IV SOLN: 250.0000 mL | INTRAVENOUS | Status: AC

## 2024-05-19 MED ORDER — QUETIAPINE FUMARATE 100 MG PO TABS
200.0000 mg | ORAL_TABLET | Freq: Every day | ORAL | Status: DC
  Administered 2024-05-19 – 2024-05-23 (×5): 200 mg
  Filled 2024-05-19 (×5): qty 2

## 2024-05-19 MED ORDER — NOREPINEPHRINE 4 MG/250ML-% IV SOLN: 0.0000 ug/min | INTRAVENOUS | Status: DC

## 2024-05-19 MED ORDER — FENTANYL BOLUS VIA INFUSION
20.0000 ug | INTRAVENOUS | Status: DC | PRN
  Administered 2024-05-22: 20 ug via INTRAVENOUS

## 2024-05-19 MED ORDER — FENTANYL 2500MCG IN NS 250ML (10MCG/ML) PREMIX INFUSION
INTRAVENOUS | Status: AC
  Administered 2024-05-19: 50 ug/h via INTRAVENOUS
  Filled 2024-05-19: qty 250

## 2024-05-19 MED ORDER — LORAZEPAM 2 MG/ML IJ SOLN
INTRAMUSCULAR | Status: AC
  Filled 2024-05-19: qty 1

## 2024-05-19 MED ORDER — POLYETHYLENE GLYCOL 3350 17 G PO PACK
17.0000 g | PACK | Freq: Every day | ORAL | Status: DC
  Administered 2024-05-19 – 2024-05-26 (×8): 17 g
  Filled 2024-05-19 (×8): qty 1

## 2024-05-19 MED ORDER — SODIUM CHLORIDE 0.9 % IV SOLN
500.0000 mg | INTRAVENOUS | Status: DC
  Administered 2024-05-19 – 2024-05-20 (×2): 500 mg via INTRAVENOUS
  Filled 2024-05-19 (×2): qty 5

## 2024-05-19 MED ORDER — LACTULOSE 10 GM/15ML PO SOLN
30.0000 g | Freq: Every day | ORAL | Status: DC | PRN
Start: 2024-05-19 — End: 2024-05-27
  Administered 2024-05-20: 30 g
  Filled 2024-05-19: qty 45

## 2024-05-19 MED ORDER — NOREPINEPHRINE 16 MG/250ML-% IV SOLN
0.0000 ug/min | INTRAVENOUS | Status: DC
  Administered 2024-05-19: 10 ug/min via INTRAVENOUS
  Administered 2024-05-23: 2 ug/min via INTRAVENOUS
  Administered 2024-05-25: 0 ug/min via INTRAVENOUS
  Filled 2024-05-19 (×4): qty 250

## 2024-05-19 MED ORDER — NOREPINEPHRINE 4 MG/250ML-% IV SOLN
0.0000 ug/min | INTRAVENOUS | Status: DC
Start: 2024-05-19 — End: 2024-05-19
  Filled 2024-05-19 (×3): qty 250

## 2024-05-19 MED ORDER — DEXMEDETOMIDINE HCL IN NACL 400 MCG/100ML IV SOLN
0.0000 ug/kg/h | INTRAVENOUS | Status: DC
  Administered 2024-05-19: 0.7 ug/kg/h via INTRAVENOUS
  Administered 2024-05-19: 0.4 ug/kg/h via INTRAVENOUS
  Administered 2024-05-20: 0.7 ug/kg/h via INTRAVENOUS
  Filled 2024-05-19 (×2): qty 100

## 2024-05-19 MED ORDER — LORAZEPAM 2 MG/ML IJ SOLN
2.0000 mg | Freq: Once | INTRAMUSCULAR | Status: AC
  Administered 2024-05-19: 2 mg via INTRAVENOUS

## 2024-05-19 MED ORDER — ATORVASTATIN CALCIUM 80 MG PO TABS
80.0000 mg | ORAL_TABLET | Freq: Every day | ORAL | Status: DC
  Filled 2024-05-19 (×2): qty 1

## 2024-05-19 MED ORDER — ACETAMINOPHEN 160 MG/5ML PO SOLN
650.0000 mg | Freq: Four times a day (QID) | ORAL | Status: AC
  Administered 2024-05-19 – 2024-05-20 (×6): 650 mg
  Filled 2024-05-19 (×6): qty 20.3

## 2024-05-19 MED ORDER — EPINEPHRINE 1 MG/10ML IV SOSY: 1.0000 mg | PREFILLED_SYRINGE | Freq: Once | INTRAVENOUS | Status: DC

## 2024-05-19 MED ORDER — QUETIAPINE FUMARATE 100 MG PO TABS
100.0000 mg | ORAL_TABLET | Freq: Every day | ORAL | Status: DC
  Administered 2024-05-19 – 2024-05-24 (×6): 100 mg
  Filled 2024-05-19 (×6): qty 1

## 2024-05-19 MED ORDER — FLUDROCORTISONE ACETATE 0.1 MG PO TABS
0.1000 mg | ORAL_TABLET | Freq: Every day | ORAL | Status: DC
  Filled 2024-05-19: qty 1

## 2024-05-19 MED ORDER — NOREPINEPHRINE 4 MG/250ML-% IV SOLN
INTRAVENOUS | Status: AC
Start: 2024-05-19 — End: 2024-05-19
  Administered 2024-05-19: 2 ug/min via INTRAVENOUS
  Filled 2024-05-19: qty 250

## 2024-05-19 MED ORDER — ETOMIDATE 2 MG/ML IV SOLN
INTRAVENOUS | Status: AC
  Administered 2024-05-19: 30 mg via INTRAVENOUS
  Filled 2024-05-19: qty 20

## 2024-05-19 MED ORDER — GABAPENTIN 300 MG PO CAPS
600.0000 mg | ORAL_CAPSULE | Freq: Every day | ORAL | Status: DC
  Filled 2024-05-19: qty 2

## 2024-05-19 MED ORDER — GABAPENTIN 300 MG PO CAPS
1200.0000 mg | ORAL_CAPSULE | Freq: Every day | ORAL | Status: DC
Start: 2024-05-19 — End: 2024-05-20
  Administered 2024-05-19: 1200 mg
  Filled 2024-05-19: qty 4

## 2024-05-19 MED ORDER — FENTANYL 2500MCG IN NS 250ML (10MCG/ML) PREMIX INFUSION
0.0000 ug/h | INTRAVENOUS | Status: DC
  Administered 2024-05-20: 50 ug/h via INTRAVENOUS
  Administered 2024-05-22: 75 ug/h via INTRAVENOUS
  Filled 2024-05-19 (×3): qty 250

## 2024-05-19 MED ORDER — QUETIAPINE FUMARATE 100 MG PO TABS
100.0000 mg | ORAL_TABLET | Freq: Every day | ORAL | Status: DC
  Filled 2024-05-19: qty 1

## 2024-05-19 NOTE — Procedures (Signed)
 Arterial Catheter Insertion Procedure Note  Keith Gibbs  [id number]  1961-05-21  Date:05/19/24  Time:3:28 PM    Provider Performing: Lenny Drought    Procedure: Insertion of Arterial Line (63379) with US  guidance (23062)   Indication(s) Blood pressure monitoring and/or need for frequent ABGs  Consent Risks of the procedure as well as the alternatives and risks of each were explained to the patient and/or caregiver.  Consent for the procedure was obtained and is signed in the bedside chart  Anesthesia None   Time Out Verified patient identification, verified procedure, site/side was marked, verified correct patient position, special equipment/implants available, medications/allergies/relevant history reviewed, required imaging and test results available.   Sterile Technique Maximal sterile technique including full sterile barrier drape, hand hygiene, sterile gown, sterile gloves, mask, hair covering, sterile ultrasound probe cover (if used).   Procedure Description Area of catheter insertion was cleaned with chlorhexidine  and draped in sterile fashion. With real-time ultrasound guidance an arterial catheter was placed into the right radial artery.  Appropriate arterial tracings confirmed on monitor.     Complications/Tolerance None; patient tolerated the procedure well.   EBL Less than   Specimen(s) None

## 2024-05-19 NOTE — Progress Notes (Addendum)
 NAME:  Keith Gibbs, MRN:  [id number], DOB:  1961-01-12, LOS: 10 ADMISSION DATE:  05/09/2024, CONSULTATION DATE: 05/14/2024 REFERRING MD: CTS, CHIEF COMPLAINT: Acute on chronic hypoxic respiratory failure  History of Present Illness:  63 yo male presented for lobectomy with CTS. Progressing as expected post operatively with CT in place and was on 4E when this morning he decompensated with worsening hypoxemia and increased wob. CTS transferred to ICU and requested CCM evaluation.     Pertinent  Medical History  RUL nodule s/p RUL lobectomy Bipolar 1 d/o Schizophrenia Polysubstance abuse Cad Mild to mod Ao insuff Htn Hyperlipidemia Tobacco use COPD Hep C  Significant Hospital Events: Including procedures, antibiotic start and stop dates in addition to other pertinent events   Admitted 10/16 for RUL Lobectomy Transferred to ICU 10/21   Interim History / Subjective:  Deteriorating on BiPAP this a.m., discussed with family, patient was intubated and put on mechanical ventilation. Antibiotics broadened to Vanco cefepime and azithromycin  On dexamethasone  20 mg daily for 5 days Otherwise there is air leak in the right chest tube, with increased expansion of right upper, rest of the lung tissue looks like ARDS like picture-managing as ARDS       Objective    Blood pressure 104/66, pulse (!) 58, temperature 98.3 F (36.8 C), temperature source Axillary, resp. rate (!) 23, height 6' 3 (1.905 m), weight 111.6 kg, SpO2 93%.    Vent Mode: BIPAP;PCV FiO2 (%):  [70 %-100 %] 70 % Set Rate:  [16 bmp] 16 bmp PEEP:  [5 cmH20] 5 cmH20   Intake/Output Summary (Last 24 hours) at 05/19/2024 0741 Last data filed at 05/18/2024 2300 Gross per 24 hour  Intake 521.64 ml  Output 2030 ml  Net -1508.36 ml   Filed Weights   05/14/24 0215 05/16/24 0600 05/17/24 0600  Weight: 116.7 kg 113.4 kg 111.6 kg    Examination: General: Sick looking on mechanical ventilation HENT:  NCAT,PEARL Lungs: Symmetric chest wall movement, crackles on right, expiratory wheeze on left Cardiovascular: Regular rate and rhythm, no murmurs or gallops Abdomen: Soft nontender nondistended Extremities: No edema Neuro: No focal deficit, baseline tremor GU: Deferred  Resolved problem list   Assessment and Plan   63 year old male who underwent right upper lobectomy due to lung adenocarcinoma, on the night of 10/21 POD 4, patient went into respiratory distress has been requiring heated high flow and have been deteriorating requiring intubation on 10/26  Acute metabolic encephalopathy ICU delirium, in the setting of patient with a history of anxiety, and schizoaffective disorder bipolar type Acute on chronic hypoxic respiratory failure-vent dependent respiratory failure Acute COPD exacerbation Acute pulmonary edema Adeno CA of the lung s/p RUL lobectomy Persistent right pneumothorax s/p chest tube placement   Plan - Intubated on mechanical ventilation will wean as tolerated to maintain sats> 92% Continue broad spectrum antibiotics-Vanc, cefepime and azithromycin , Continue nebs Strict I&O's Monitor chest tube output Theres air leak in the chest tube and slight expansion on Right upper CXR-No obvious pneumothorax Monitor and replace electrolytes     Labs   CBC: Recent Labs  Lab 05/15/24 0315 05/16/24 0748 05/17/24 0417 05/17/24 1455 05/18/24 0244 05/18/24 1444 05/19/24 0232  WBC 10.6* 7.4 10.5  --  14.5*  --  14.5*  NEUTROABS  --   --   --   --   --   --  10.9*  HGB 10.0* 11.0* 11.2* 10.9* 10.7* 10.9* 10.3*  HCT 28.5* 33.1* 32.6* 32.0* 31.1* 32.0* 30.1*  MCV 95.3 100.0 96.2  --  95.4  --  95.3  PLT 163 180 213  --  192  --  174    Basic Metabolic Panel: Recent Labs  Lab 05/14/24 0550 05/15/24 0315 05/16/24 0748 05/17/24 0417 05/17/24 1455 05/18/24 0244 05/18/24 1444 05/19/24 0232  NA 130* 129* 131* 132* 129* 128* 131* 131*  K 4.0 4.5 4.1 3.9 4.1 4.3 4.1  4.6  CL 96* 95* 93* 92*  --  90*  --  91*  CO2 24 24 24 28   --  28  --  26  GLUCOSE 127* 133* 102* 96  --  104*  --  121*  BUN 17 31* 23 22  --  27*  --  27*  CREATININE 1.03 1.23 0.91 0.96  --  1.33*  --  1.22  CALCIUM  8.1* 7.8* 8.2* 8.1*  --  7.5*  --  7.6*  MG 1.8 2.6*  --   --   --   --   --   --    GFR: Estimated Creatinine Clearance: 83.5 mL/min (by C-G formula based on SCr of 1.22 mg/dL). Recent Labs  Lab 05/14/24 0809 05/15/24 0315 05/16/24 0748 05/17/24 0417 05/17/24 1535 05/18/24 0244 05/19/24 0232  PROCALCITON 0.41  --   --   --  0.59  --   --   WBC  --    < > 7.4 10.5  --  14.5* 14.5*   < > = values in this interval not displayed.    Liver Function Tests: No results for input(s): AST, ALT, ALKPHOS, BILITOT, PROT, ALBUMIN  in the last 168 hours.  No results for input(s): LIPASE, AMYLASE in the last 168 hours. No results for input(s): AMMONIA in the last 168 hours.  ABG    Component Value Date/Time   PHART 7.423 05/18/2024 1444   PCO2ART 48.1 (H) 05/18/2024 1444   PO2ART 44 (L) 05/18/2024 1444   HCO3 31.3 (H) 05/18/2024 1444   TCO2 33 (H) 05/18/2024 1444   ACIDBASEDEF 1.2 05/14/2024 0034   O2SAT 79 05/18/2024 1444     Coagulation Profile: No results for input(s): INR, PROTIME in the last 168 hours.  Cardiac Enzymes: No results for input(s): CKTOTAL, CKMB, CKMBINDEX, TROPONINI in the last 168 hours.  HbA1C: Hgb A1c MFr Bld  Date/Time Value Ref Range Status  02/12/2024 08:40 AM 5.3 4.8 - 5.6 % Final    Comment:             Prediabetes: 5.7 - 6.4          Diabetes: >6.4          Glycemic control for adults with diabetes: <7.0   04/26/2022 03:30 PM 5.2 4.8 - 5.6 % Final    Comment:    (NOTE) Pre diabetes:          5.7%-6.4%  Diabetes:              >6.4%  Glycemic control for   <7.0% adults with diabetes     CBG: Recent Labs  Lab 05/18/24 0735 05/18/24 1127 05/18/24 1649 05/18/24 2104 05/19/24 0723  GLUCAP  105* 118* 145* 137* 122*    Review of Systems:   Negative except above  Past Medical History:  He,  has a past medical history of Anginal pain, Anxiety, Arthritis, Bipolar 1 disorder (HCC), Bursitis, CAD (coronary artery disease), Cancer (HCC), Chronic pain, COPD (chronic obstructive pulmonary disease) (HCC), Current use of long term anticoagulation, Depression, Diverticulitis, Dyspnea, GERD (gastroesophageal  reflux disease), Grade I diastolic dysfunction, Hepatitis C (2012), HLD (hyperlipidemia), Hypertension, MI (myocardial infarction) (HCC), Polysubstance abuse (HCC), PUD (peptic ulcer disease), S/P angioplasty with stent (06/10/2016), S/P PTCA (percutaneous transluminal coronary angioplasty) (12/04/2019), Schizophrenia (HCC), Stroke (HCC), Tremors, and Valvular insufficiency.   Surgical History:   Past Surgical History:  Procedure Laterality Date   ABDOMINAL SURGERY     removed small piece of intestines due to [facility] Diverticulosis   ANTERIOR LAT LUMBAR FUSION N/A 08/23/2022   Procedure: DIRECT LATERAL INTERBODY FUSION  LUMBAR TWO- LUMBAR THREE, LUMBAR THREE-LUMBAR FOUR , EXPLORE AND EXTEND FUSION LUMBAR TWO-LUMBAR FIVE, POSTERIOR DECOMPRESSION LUMBAR THREE-LUMBAR FOUR, LEFT LUMBAR TWO-LUMBAR THREE;  Surgeon: Debby Dorn MATSU, MD;  Location: [facility] OR;  Service: Neurosurgery;  Laterality: N/A;   APPENDECTOMY     BACK SURGERY     CARDIAC CATHETERIZATION Left 06/10/2016   Procedure: Left Heart Cath and Coronary Angiography;  Surgeon: Denyse DELENA Bathe, MD;  Location: [facility] INVASIVE CV LAB;  Service: Cardiovascular;  Laterality: Left;   CARDIAC CATHETERIZATION N/A 06/10/2016   Procedure: Coronary Stent Intervention;  Surgeon: Cara JONETTA Lovelace, MD;  Location: [facility] INVASIVE CV LAB;  Service: Cardiovascular;  Laterality: N/A;   CHOLECYSTECTOMY N/A 02/17/2022   Procedure: LAPAROSCOPIC CHOLECYSTECTOMY;  Surgeon: Stevie Herlene Righter, MD;  Location: [facility] OR;  Service: General;  Laterality: N/A;   COLON  SURGERY     COLONOSCOPY     COLONOSCOPY WITH PROPOFOL  N/A 01/05/2017   Procedure: COLONOSCOPY WITH PROPOFOL ;  Surgeon: Therisa Bi, MD;  Location: [facility] ENDOSCOPY;  Service: Endoscopy;  Laterality: N/A;   COLONOSCOPY WITH PROPOFOL  N/A 02/13/2020   Procedure: COLONOSCOPY WITH PROPOFOL ;  Surgeon: Janalyn Keene NOVAK, MD;  Location: [facility] ENDOSCOPY;  Service: Endoscopy;  Laterality: N/A;   CORONARY ANGIOPLASTY WITH STENT PLACEMENT     CORONARY PRESSURE/FFR STUDY N/A 12/04/2019   Procedure: INTRAVASCULAR PRESSURE WIRE/FFR STUDY;  Surgeon: Wonda Sharper, MD;  Location: [facility] INVASIVE CV LAB;  Service: Cardiovascular;  Laterality: N/A;   ESOPHAGOGASTRODUODENOSCOPY (EGD) WITH PROPOFOL  N/A 01/05/2017   Procedure: ESOPHAGOGASTRODUODENOSCOPY (EGD) WITH PROPOFOL ;  Surgeon: Therisa Bi, MD;  Location: [facility] ENDOSCOPY;  Service: Endoscopy;  Laterality: N/A;   ESOPHAGOGASTRODUODENOSCOPY (EGD) WITH PROPOFOL  N/A 02/13/2020   Procedure: ESOPHAGOGASTRODUODENOSCOPY (EGD) WITH PROPOFOL ;  Surgeon: Janalyn Keene NOVAK, MD;  Location: [facility] ENDOSCOPY;  Service: Endoscopy;  Laterality: N/A;   INTERCOSTAL NERVE BLOCK  05/09/2024   Procedure: BLOCK, NERVE, INTERCOSTAL;  Surgeon: Kerrin Elspeth BROCKS, MD;  Location: [facility] OR;  Service: Thoracic;;   KNEE ARTHROSCOPY WITH MEDIAL MENISECTOMY Right 09/05/2017   Procedure: KNEE ARTHROSCOPY WITH MEDIAL AND LATERAL  MENISECTOMY PARTIAL SYNOVECTOMY;  Surgeon: Kathlynn Sharper, MD;  Location: [facility] ORS;  Service: Orthopedics;  Laterality: Right;   LEFT HEART CATH AND CORONARY ANGIOGRAPHY N/A 12/04/2019   Procedure: LEFT HEART CATH AND CORONARY ANGIOGRAPHY;  Surgeon: Wonda Sharper, MD;  Location: [facility] INVASIVE CV LAB;  Service: Cardiovascular;  Laterality: N/A;   LEFT HEART CATH AND CORONARY ANGIOGRAPHY Left 11/25/2022   Procedure: LEFT HEART CATH AND CORONARY ANGIOGRAPHY;  Surgeon: Bathe Denyse DELENA, MD;  Location: [facility] INVASIVE CV LAB;  Service: Cardiovascular;  Laterality: Left;   LOBECTOMY,  LUNG, ROBOT-ASSISTED, USING VATS Right 05/09/2024   Procedure: LOBECTOMY, LUNG RIGHT UPPER LOBE, ROBOT-ASSISTED, USING VATS;  Surgeon: Kerrin Elspeth BROCKS, MD;  Location: [facility] OR;  Service: Thoracic;  Laterality: Right;   LYMPH NODE BIOPSY  05/09/2024   Procedure: LYMPH NODE BIOPSY;  Surgeon: Kerrin Elspeth BROCKS, MD;  Location: [facility] OR;  Service: Thoracic;;  REVERSE SHOULDER ARTHROPLASTY Right 08/31/2023   Procedure: RIGHT REVERSE SHOULDER ARTHROPLASTY;  Surgeon: [name], MD;  Location: [facility] OR;  Service: Orthopedics;  Laterality: Right;   SHOULDER SURGERY Right 04/09/2012   SPINE SURGERY     TOTAL KNEE ARTHROPLASTY Right 01/19/2021   Procedure: TOTAL KNEE ARTHROPLASTY - [place] Amber to Assist;  Surgeon: Kathlynn Sharper, MD;  Location: [facility] ORS;  Service: Orthopedics;  Laterality: Right;   WEDGE RESECTION, LUNG, ROBOT-ASSISTED, THORACOSCOPIC Right 05/09/2024   Procedure: WEDGE RESECTION, LUNG RIGHT UPPER LOBE, ROBOT-ASSISTED, THORACOSCOPIC;  Surgeon: Kerrin Elspeth BROCKS, MD;  Location: [facility] OR;  Service: Thoracic;  Laterality: Right;     Social History:   reports that he quit smoking about 25 years ago. His smoking use included cigarettes. He started smoking about 45 years ago. He has a 63.8 pack-year smoking history. He has been exposed to tobacco smoke. He has never used smokeless tobacco. He reports that he does not currently use alcohol  after a past usage of about 3.0 standard drinks of alcohol  per week. He reports that he does not currently use drugs after having used the following drugs: Cocaine and Marijuana.   Family History:  His family history includes Depression in his mother; Early death in his father; Heart attack in his father; Heart disease in his father and mother; Hypertension in his father, mother, and sister; Osteoarthritis in his mother; Parkinson's disease in his maternal grandfather. There is no history of Prostate cancer, Bladder Cancer, Kidney cancer, or Tremor.    Allergies Allergies  Allergen Reactions   Asenapine Other (See Comments) and Nausea And Vomiting    Increased tremors   Dextromethorphan Hbr    Guaifenesin     Latuda [Lurasidone Hcl] Other (See Comments)    Tremors     Lurasidone Other (See Comments) and Hives    Other Reaction(s): Angioedema   Phenylephrine       Home Medications  Prior to Admission medications   Medication Sig Start Date End Date Taking? Authorizing Provider  acetaminophen  (TYLENOL ) 325 MG tablet Take 1 tablet (325 mg total) by mouth every 6 (six) hours as needed for mild pain (pain score 1-3) (or temp > 100.5). 09/01/23  Yes [name], MD  albuterol  (VENTOLIN  HFA) 108 [phone] Base) MCG/ACT inhaler Inhale 2 puffs into the lungs every 6 (six) hours as needed for wheezing or shortness of breath. 12/19/23  Yes Lorren Greig PARAS, NP  aspirin  EC 81 MG tablet Take 1 tablet (81 mg total) by mouth daily. Swallow whole. 02/03/23  Yes Evelena, Nadir, MD  atorvastatin  (LIPITOR ) 80 MG tablet TAKE 1 TABLET BY MOUTH EVERY DAY 01/15/24  Yes [name], MD  budesonide -glycopyrrolate -formoterol  (BREZTRI  AEROSPHERE) 160-9-4.8 MCG/ACT AERO inhaler Inhale 2 puffs into the lungs in the morning and at bedtime. 12/19/23  Yes Lorren, Amy J, NP  celecoxib  (CELEBREX ) 100 MG capsule TAKE 2 CAPSULES BY MOUTH TWICE A DAY 04/25/24  Yes Georgina Sharper LABOR, MD  divalproex  (DEPAKOTE ) 500 MG DR tablet Take half tablet in the morning and 3 tablets at bedtime Patient taking differently: Take half tablet in the morning, 1 in the afternoon, and 2 tablets at bedtime 04/25/24  Yes Izella Ismael NOVAK, MD  DULoxetine  (CYMBALTA ) 30 MG capsule Take 3 capsules (90 mg total) by mouth daily. Patient taking differently: Take 30 mg by mouth 3 (three) times daily. 04/25/24 06/24/24 Yes Izella Ismael NOVAK, MD  gabapentin  (NEURONTIN ) 600 MG tablet Take 1 tablet (600 mg total) by mouth daily AND 2  tablets (1,200 mg total) at bedtime. 04/25/24  Yes Izella Ismael NOVAK, MD   hydrOXYzine  (ATARAX ) 25 MG tablet Take 1 tablet (25 mg total) by mouth 2 (two) times daily as needed. Patient taking differently: Take 25 mg by mouth daily. 04/25/24  Yes Izella Ismael NOVAK, MD  isosorbide  mononitrate (IMDUR ) 30 MG 24 hr tablet TAKE 1 TABLET BY MOUTH 2 TIMES DAILY. 04/09/24  Yes Fernand Denyse LABOR, MD  Multiple Vitamins-Minerals (MENS 50+ MULTIVITAMIN) TABS Take 1 tablet by mouth in the morning.   Yes [provider]  nicotine  polacrilex (NICOTINE  MINI) 2 MG lozenge Take 1 lozenge (2 mg total) by mouth every 2 (two) hours as needed for smoking cessation. 03/26/24 06/24/24 Yes Dgayli, Belva, MD  nitroGLYCERIN  (NITROSTAT ) 0.4 MG SL tablet Place 1 tablet (0.4 mg total) under the tongue every 5 (five) minutes as needed for chest pain. 02/03/23 05/06/24 Yes Evelena Figures, MD  pantoprazole  (PROTONIX ) 40 MG tablet Take 1 tablet (40 mg total) by mouth daily. 12/19/23  Yes Lorren, Amy J, NP  QUEtiapine  (SEROQUEL ) 200 MG tablet Take 1 tablet (200 mg total) by mouth at bedtime. 04/25/24  Yes Izella Ismael NOVAK, MD  traZODone  (DESYREL ) 100 MG tablet Take 0.5 tablets (50 mg total) by mouth at bedtime for 7 days. Patient not taking: Reported on 05/06/2024 04/25/24 05/02/24  Izella Ismael NOVAK, MD           Lenny Drought, MD   Pulmonary Critical Care Prefer epic messenger for cross cover needs   My critical care time: 35 minutes  Critical care time was exclusive of separately billable procedures and treating other patients.  Critical care was necessary to treat or prevent imminent or life-threatening deterioration.  Critical care was time spent personally by me on the following activities: development of treatment plan with patient and/or surrogate as well as nursing, discussions with consultants, evaluation of patient's response to treatment, examination of patient, obtaining history from patient or surrogate, ordering and performing treatments and interventions, ordering and  review of laboratory studies, ordering and review of radiographic studies, pulse oximetry, re-evaluation of patient's condition and participation in multidisciplinary rounds.   05/19/2024, 7:41 AM

## 2024-05-19 NOTE — Progress Notes (Signed)
 10 Days Post-Op Procedure(s) (LRB): WEDGE RESECTION, LUNG RIGHT UPPER LOBE, ROBOT-ASSISTED, THORACOSCOPIC (Right) LOBECTOMY, LUNG RIGHT UPPER LOBE, ROBOT-ASSISTED, USING VATS (Right) LYMPH NODE BIOPSY BLOCK, NERVE, INTERCOSTAL Subjective: Increased oxygen requirement throughout the day, placed on BiPAP at 100%, now down to 70% Agitated with the mask, started dex, now bradycardic but stable  Objective: Vital signs in last 24 hours: Temp:  [98.3 F (36.8 C)-98.9 F (37.2 C)] 98.3 F (36.8 C) (10/25 2310) Pulse Rate:  [58-92] 58 (10/26 0703) Cardiac Rhythm: Normal sinus rhythm (10/25 2000) Resp:  [14-32] 23 (10/26 0703) BP: (94-128)/(54-76) 104/66 (10/26 0703) SpO2:  [76 %-100 %] 93 % (10/26 0712) FiO2 (%):  [70 %-100 %] 70 % (10/26 0703)  Hemodynamic parameters for last 24 hours:    Intake/Output from previous day: 10/25 0701 - 10/26 0700 In: 521.6 [I.V.:58.9; IV Piggyback:462.7] Out: 2030 [Urine:1750; Chest Tube:280] Intake/Output this shift: No intake/output data recorded.  General appearance: sleepy but easily awakened by voice Neurologic: intact Heart: S1, S2 normal Lungs: rhonchi diffusely and large air leak Abdomen: soft, ND/NT   Lab Results: Recent Labs    05/18/24 0244 05/18/24 1444 05/19/24 0232  WBC 14.5*  --  14.5*  HGB 10.7* 10.9* 10.3*  HCT 31.1* 32.0* 30.1*  PLT 192  --  174   BMET:  Recent Labs    05/18/24 0244 05/18/24 1444 05/19/24 0232  NA 128* 131* 131*  K 4.3 4.1 4.6  CL 90*  --  91*  CO2 28  --  26  GLUCOSE 104*  --  121*  BUN 27*  --  27*  CREATININE 1.33*  --  1.22  CALCIUM  7.5*  --  7.6*    PT/INR: No results for input(s): LABPROT, INR in the last 72 hours. ABG    Component Value Date/Time   PHART 7.423 05/18/2024 1444   HCO3 31.3 (H) 05/18/2024 1444   TCO2 33 (H) 05/18/2024 1444   ACIDBASEDEF 1.2 05/14/2024 0034   O2SAT 79 05/18/2024 1444   CBG (last 3)  Recent Labs    05/18/24 1127 05/18/24 1649 05/18/24 2104   GLUCAP 118* 145* 137*    Assessment/Plan: S/P Procedure(s) (LRB): WEDGE RESECTION, LUNG RIGHT UPPER LOBE, ROBOT-ASSISTED, THORACOSCOPIC (Right) LOBECTOMY, LUNG RIGHT UPPER LOBE, ROBOT-ASSISTED, USING VATS (Right) LYMPH NODE BIOPSY BLOCK, NERVE, INTERCOSTAL Pulmonary status remains tenuous   NEURO- intact  PSYCH- anxiety- on meds  CV- in SR, BP stable  RESP- acute on chronic respiratory failure On nebs, steroids and diuretics  1.5L negative yesterday without significant impact on O2 demand or CXR CXR still shows a right apical space + unchanged, severe diffuse AS opacity bilaterally Still with large air leak On Zosyn empirically for possible pneumonia  RENAL- creatinine stable, diuresed well yesterday  K 3.9 - supplement  ENDO- CBG well controlled  Gi- tolerating diet  SCD + enoxaparin  for DVT prophylaxis   LOS: 10 days    Con RAMAN Getsemani Lindon 05/19/2024

## 2024-05-19 NOTE — Progress Notes (Signed)
 RT completed a recruitment maneuver for 1 minute with the following settings: PC 22 R10 100% +10 iT 3.00  Patient's vitals remained stable. No change in sats. Recruitment maneuver stopped and patient placed back on PRVC 550 R22 +10 100%.  CCM at bedside for the maneuver.

## 2024-05-19 NOTE — Procedures (Signed)
 Central Venous Catheter Insertion Procedure Note  Keith Gibbs  [id number]  [birth year]  Date:05/19/24  Time:12:44 PM   Provider Performing:Keith [name] Gibbs   Procedure: Insertion of Non-tunneled Central Venous Catheter(36556) with US  guidance (23062)   Indication(s) Shock  Consent Unable to obtain consent due to emergent nature of procedure.  Anesthesia Topical only with 1% lidocaine    Timeout Verified patient identification, verified procedure, site/side was marked, verified correct patient position, special equipment/implants available, medications/allergies/relevant history reviewed, required imaging and test results available.  Sterile Technique Maximal sterile technique including full sterile barrier drape, hand hygiene, sterile gown, sterile gloves, mask, hair covering, sterile ultrasound probe cover (if used).  Procedure Description Area of catheter insertion was cleaned with chlorhexidine  and draped in sterile fashion.  With real-time ultrasound guidance a central venous catheter was placed into the left internal jugular vein. Nonpulsatile blood flow and easy flushing noted in all ports.  The catheter was sutured in place and sterile dressing applied.  Complications/Tolerance None; patient tolerated the procedure well. Chest X-ray is ordered to verify placement for internal jugular or subclavian cannulation.   Chest x-ray is not ordered for femoral cannulation.  EBL Minimal  Specimen(s) None

## 2024-05-19 NOTE — Progress Notes (Signed)
 TCTS PM Rounding Progress Note  Worsening respiratory status this morning, now intubated PaCO2 98.5 after intubation, now down to 52.   Also febrile, Tm 39.2, abx broadened to azithromycin , meropenem and vanc Heparin  gtt and NO started empirically given dilated RV, non-compressible left femoral vein FiO2 down to 80%  Vitals:   05/19/24 1557 05/19/24 1613  BP: 101/62 99/65  Pulse: 95 (!) 102  Resp: (!) 30 15  Temp: (!) 102.6 F (39.2 C) (!) 102.2 F (39 C)  SpO2: 96% (!) 87%    Plan: - follow-up blood cultures, UA - Continue excellent ICU care by CCM team - Continue broad-spectrum antibiotics - Wean levo as tolerated  Con Clunes, MD Cardiothoracic Surgery Pager: [phone]

## 2024-05-19 NOTE — Progress Notes (Signed)
 eLink Physician-Brief Progress Note Patient Name: Keith Gibbs DOB: Dec 08, 1960 MRN: [id number]   Date of Service  05/19/2024  HPI/Events of Note  Pt had been tolerating BIPAP, but now appears to be increasingly agitated, trying to take off the mask.   eICU Interventions  Uptitrate precedex , monitor for bradycardia, hypotension.  Will give ativan  IV for breakthrough agitation.  Will continue to monitor closely.         Uziel Covault M DELA CRUZ 05/19/2024, 2:56 AM

## 2024-05-19 NOTE — Procedures (Signed)
 Intubation Procedure Note  Keith Gibbs  [id number]  02-17-1961  Date:05/19/24  Time:11:59 AM   Provider Performing:Janasia Coverdale    Procedure: Intubation (31500)  Indication(s) Respiratory Failure  Consent Risks of the procedure as well as the alternatives and risks of each were explained to the patient and/or caregiver.  Consent for the procedure was obtained and is signed in the bedside chart   Anesthesia Etomidate and Succinylcholine    Time Out Verified patient identification, verified procedure, site/side was marked, verified correct patient position, special equipment/implants available, medications/allergies/relevant history reviewed, required imaging and test results available.   Sterile Technique Usual hand hygeine, masks, and gloves were used   Procedure Description Patient positioned in bed supine.  Sedation given as noted above.  Patient was intubated with endotracheal tube using Glidescope.  View was Grade 1 full glottis .  Number of attempts was 2.  Colorimetric CO2 detector was consistent with tracheal placement.   Complications/Tolerance None; patient tolerated the procedure well. Chest X-ray is ordered to verify placement.   EBL Minimal   Specimen(s) None

## 2024-05-19 NOTE — Progress Notes (Signed)
 Vent changes made per Dr. CHARM Sharps, CCM post ABG results.

## 2024-05-19 NOTE — Progress Notes (Addendum)
 05/19/2024 De-recruited after intubation.  Now difficult to ventilate; can get somewhat on top of it with supranormal vent settings and heavy sedation but this leads to profound shock and high dose pressors to overcome his intrinsic resp drive; APRV helps this sedation need a good bit but still tough to ventilate; shearing forces seem okay but not as ideal as LTVV (which I think achieving at this point is impossible).  Interestingly, ventilates very well with PS but sats drop so mean airway pressures need to be kept elevated.  Attempted inverse ratio PC but unable to get same sat improvement as achieved with APRV.  Echo showing RV dilation, no clear McConnell's but left femoral vein I think is not fully compressible so I think odd presentation here is combination VTE and ARDS.  Will temporize with pulmonary vasodilators, heparin  gtt; otherwise would need heavy sedation and high dose pressors along with associated complications (which would be route if we had to prone) as well as accepting lower sat goals.  Nitric having some issues getting steady state reading on amount delivered due to ongoing air leak.  Guarded prognosis, family updated at bedside.  Additional hour cc time.  Rolan Sharps MD PCCM

## 2024-05-19 NOTE — Progress Notes (Signed)
 PHARMACY - ANTICOAGULATION CONSULT NOTE  Pharmacy Consult for heparin  Indication: VTE  Allergies  Allergen Reactions   Asenapine Other (See Comments) and Nausea And Vomiting    Increased tremors   Dextromethorphan Hbr    Guaifenesin     Latuda [Lurasidone Hcl] Other (See Comments)    Tremors     Lurasidone Other (See Comments) and Hives    Other Reaction(s): Angioedema   Phenylephrine      Patient Measurements: Height: 6' 3 (190.5 cm) Weight: 111.6 kg (246 lb 0.5 oz) IBW/kg (Calculated) : 84.5  Vital Signs: Temp: 102.2 F (39 C) (10/26 1613) Temp Source: Axillary (10/26 0712) BP: 99/65 (10/26 1613) Pulse Rate: 102 (10/26 1613)  Labs: Recent Labs    05/17/24 0417 05/17/24 1455 05/18/24 0244 05/18/24 1444 05/19/24 0232 05/19/24 0821 05/19/24 1302 05/19/24 1427 05/19/24 1557  HGB 11.2*   < > 10.7*   < > 10.3*   < > 12.2* 11.9* 11.6*  HCT 32.6*   < > 31.1*   < > 30.1*   < > 36.0* 35.0* 34.0*  PLT 213  --  192  --  174  --   --   --   --   CREATININE 0.96  --  1.33*  --  1.22  --   --   --   --    < > = values in this interval not displayed.    Estimated Creatinine Clearance: 83.5 mL/min (by C-G formula based on SCr of 1.22 mg/dL).   Medical History: Past Medical History:  Diagnosis Date   Anginal pain    Anxiety    Arthritis    Bipolar 1 disorder (HCC)    Bursitis    CAD (coronary artery disease)    Stent placed 2017   Cancer (HCC)    Chronic pain    COPD (chronic obstructive pulmonary disease) (HCC)    Current use of long term anticoagulation    DAPT (ASA + clopidogrel )   Depression    Diverticulitis    Dyspnea    GERD (gastroesophageal reflux disease)    Grade I diastolic dysfunction    Hepatitis C 2012   No longer has Hep C   HLD (hyperlipidemia)    Hypertension    MI (myocardial infarction) (HCC)    Polysubstance abuse (HCC)    cocaine, marijuana, ETOH   PUD (peptic ulcer disease)    S/P angioplasty with stent 06/10/2016   a.) 90%  stenosis of pLAD to mLAD - 2.5 x 18 mm Xience Alpine (DES x 1) placed to pLAD   S/P PTCA (percutaneous transluminal coronary angioplasty) 12/04/2019   a.) 60% in stent restenosis of DES to pLAD; LVEF 65%.   Schizophrenia (HCC)    Stroke (HCC)    Tremors    generalized   Valvular insufficiency    a.) Mild MR, TR, PR; mild to moderate AR on 03/05/2018 TTE      Assessment: 63 yoM admitted for RUL lobectomy. Pt intubated, now having difficulty with ventilation. Pharmacy to dose IV heparin  while ruling out VTE. Pt just received SQ enoxaparin  and had CVC placed so will defer bolus.  Goal of Therapy:  Heparin  level 0.3-0.7 units/ml Monitor platelets by anticoagulation protocol: Yes   Plan:  -Heparin  1500 units/h no bolus -Check heparin  level in 6h   Ozell Jamaica, PharmD, [place], [facility] Clinical Pharmacist [phone] Please check AMION for all [facility] Pharmacy numbers 05/19/2024

## 2024-05-19 NOTE — Progress Notes (Signed)
 eLink Physician-Brief Progress Note Patient Name: Keith Gibbs DOB: 1961-06-17 MRN: [id number]   Date of Service  05/19/2024  HPI/Events of Note  RN requests increase in concentration of norepinephrine since patient is getting diuresed.  eICU Interventions  Concentration increased to quadruple strength through a central line.  RN informed.     Intervention Category Minor Interventions: Routine modifications to care plan (e.g. PRN medications for pain, fever)  Jerilynn Berg 05/19/2024, 8:19 PM

## 2024-05-20 ENCOUNTER — Inpatient Hospital Stay (HOSPITAL_COMMUNITY): Payer: MEDICAID

## 2024-05-20 ENCOUNTER — Ambulatory Visit (HOSPITAL_COMMUNITY): Payer: MEDICAID

## 2024-05-20 DIAGNOSIS — J939 Pneumothorax, unspecified: Secondary | ICD-10-CM

## 2024-05-20 DIAGNOSIS — J441 Chronic obstructive pulmonary disease with (acute) exacerbation: Secondary | ICD-10-CM | POA: Diagnosis not present

## 2024-05-20 DIAGNOSIS — R609 Edema, unspecified: Secondary | ICD-10-CM

## 2024-05-20 DIAGNOSIS — A419 Sepsis, unspecified organism: Secondary | ICD-10-CM

## 2024-05-20 DIAGNOSIS — J80 Acute respiratory distress syndrome: Secondary | ICD-10-CM

## 2024-05-20 DIAGNOSIS — N179 Acute kidney failure, unspecified: Secondary | ICD-10-CM

## 2024-05-20 DIAGNOSIS — R6521 Severe sepsis with septic shock: Secondary | ICD-10-CM

## 2024-05-20 DIAGNOSIS — D649 Anemia, unspecified: Secondary | ICD-10-CM

## 2024-05-20 DIAGNOSIS — E440 Moderate protein-calorie malnutrition: Secondary | ICD-10-CM | POA: Insufficient documentation

## 2024-05-20 DIAGNOSIS — Z902 Acquired absence of lung [part of]: Secondary | ICD-10-CM | POA: Diagnosis not present

## 2024-05-20 DIAGNOSIS — Z9911 Dependence on respirator [ventilator] status: Secondary | ICD-10-CM

## 2024-05-20 LAB — POCT I-STAT 7, (LYTES, BLD GAS, ICA,H+H)
Acid-Base Excess: 2 mmol/L (ref 0.0–2.0)
Acid-Base Excess: 4 mmol/L — ABNORMAL HIGH (ref 0.0–2.0)
Bicarbonate: 28.1 mmol/L — ABNORMAL HIGH (ref 20.0–28.0)
Bicarbonate: 29.2 mmol/L — ABNORMAL HIGH (ref 20.0–28.0)
Calcium, Ion: 1.02 mmol/L — ABNORMAL LOW (ref 1.15–1.40)
Calcium, Ion: 1.04 mmol/L — ABNORMAL LOW (ref 1.15–1.40)
HCT: 33 % — ABNORMAL LOW (ref 39.0–52.0)
HCT: 31 % — ABNORMAL LOW (ref 39.0–52.0)
Hemoglobin: 11.2 g/dL — ABNORMAL LOW (ref 13.0–17.0)
Hemoglobin: 10.5 g/dL — ABNORMAL LOW (ref 13.0–17.0)
O2 Saturation: 98 %
O2 Saturation: 95 %
Patient temperature: 38.6
Patient temperature: 38
Potassium: 4.6 mmol/L (ref 3.5–5.1)
Potassium: 4.6 mmol/L (ref 3.5–5.1)
Sodium: 135 mmol/L (ref 135–145)
Sodium: 136 mmol/L (ref 135–145)
TCO2: 30 mmol/L (ref 22–32)
TCO2: 31 mmol/L (ref 22–32)
pCO2 arterial: 52.6 mmHg — ABNORMAL HIGH (ref 32–48)
pCO2 arterial: 49.9 mmHg — ABNORMAL HIGH (ref 32–48)
pH, Arterial: 7.343 — ABNORMAL LOW (ref 7.35–7.45)
pH, Arterial: 7.38 (ref 7.35–7.45)
pO2, Arterial: 124 mmHg — ABNORMAL HIGH (ref 83–108)
pO2, Arterial: 83 mmHg (ref 83–108)

## 2024-05-20 LAB — BASIC METABOLIC PANEL WITH GFR
Anion gap: 16 — ABNORMAL HIGH (ref 5–15)
BUN: 47 mg/dL — ABNORMAL HIGH (ref 8–23)
CO2: 25 mmol/L (ref 22–32)
Calcium: 7.6 mg/dL — ABNORMAL LOW (ref 8.9–10.3)
Chloride: 94 mmol/L — ABNORMAL LOW (ref 98–111)
Creatinine, Ser: 1.85 mg/dL — ABNORMAL HIGH (ref 0.61–1.24)
GFR, Estimated: 40 mL/min — ABNORMAL LOW (ref >60)
Glucose, Bld: 185 mg/dL — ABNORMAL HIGH (ref 70–99)
Potassium: 4.6 mmol/L (ref 3.5–5.1)
Sodium: 135 mmol/L (ref 135–145)

## 2024-05-20 LAB — GLUCOSE, CAPILLARY
Glucose-Capillary: 150 mg/dL — ABNORMAL HIGH (ref 70–99)
Glucose-Capillary: 150 mg/dL — ABNORMAL HIGH (ref 70–99)
Glucose-Capillary: 162 mg/dL — ABNORMAL HIGH (ref 70–99)
Glucose-Capillary: 156 mg/dL — ABNORMAL HIGH (ref 70–99)
Glucose-Capillary: 160 mg/dL — ABNORMAL HIGH (ref 70–99)
Glucose-Capillary: 173 mg/dL — ABNORMAL HIGH (ref 70–99)

## 2024-05-20 LAB — CBC WITH DIFFERENTIAL/PLATELET
Abs Immature Granulocytes: 1.14 K/uL — ABNORMAL HIGH (ref 0.00–0.07)
Basophils Absolute: 0.1 K/uL (ref 0.0–0.1)
Basophils Relative: 1 %
Eosinophils Absolute: 0 K/uL (ref 0.0–0.5)
Eosinophils Relative: 0 %
HCT: 32.8 % — ABNORMAL LOW (ref 39.0–52.0)
Hemoglobin: 11.1 g/dL — ABNORMAL LOW (ref 13.0–17.0)
Immature Granulocytes: 7 %
Lymphocytes Relative: 5 %
Lymphs Abs: 0.9 K/uL (ref 0.7–4.0)
MCH: 32.6 pg (ref 26.0–34.0)
MCHC: 33.8 g/dL (ref 30.0–36.0)
MCV: 96.2 fL (ref 80.0–100.0)
Monocytes Absolute: 0.7 K/uL (ref 0.1–1.0)
Monocytes Relative: 5 %
Neutro Abs: 13.3 K/uL — ABNORMAL HIGH (ref 1.7–7.7)
Neutrophils Relative %: 82 %
Platelets: 148 K/uL — ABNORMAL LOW (ref 150–400)
RBC: 3.41 MIL/uL — ABNORMAL LOW (ref 4.22–5.81)
RDW: 13.8 % (ref 11.5–15.5)
WBC: 16.2 K/uL — ABNORMAL HIGH (ref 4.0–10.5)
nRBC: 0 % (ref 0.0–0.2)

## 2024-05-20 LAB — HEPARIN LEVEL (UNFRACTIONATED)
Heparin Unfractionated: 0.49 [IU]/mL (ref 0.30–0.70)
Heparin Unfractionated: 0.5 [IU]/mL (ref 0.30–0.70)

## 2024-05-20 LAB — PHOSPHORUS: Phosphorus: 7.1 mg/dL — ABNORMAL HIGH (ref 2.5–4.6)

## 2024-05-20 LAB — MAGNESIUM: Magnesium: 2.6 mg/dL — ABNORMAL HIGH (ref 1.7–2.4)

## 2024-05-20 LAB — TRIGLYCERIDES: Triglycerides: 127 mg/dL (ref <150)

## 2024-05-20 LAB — LACTIC ACID, PLASMA
Lactic Acid, Venous: 2.2 mmol/L (ref 0.5–1.9)
Lactic Acid, Venous: 2 mmol/L (ref 0.5–1.9)

## 2024-05-20 MED ORDER — VALPROATE SODIUM 100 MG/ML IV SOLN
500.0000 mg | Freq: Three times a day (TID) | INTRAVENOUS | Status: DC
  Administered 2024-05-20 – 2024-05-22 (×8): 500 mg via INTRAVENOUS
  Filled 2024-05-20 (×2): qty 5
  Filled 2024-05-20 (×7): qty 500

## 2024-05-20 MED ORDER — GABAPENTIN 250 MG/5ML PO SOLN
1200.0000 mg | Freq: Every day | ORAL | Status: DC
  Administered 2024-05-20 – 2024-05-26 (×7): 1200 mg
  Filled 2024-05-20 (×8): qty 24

## 2024-05-20 MED ORDER — VITAL 1.5 CAL PO LIQD
1000.0000 mL | ORAL | Status: DC
  Administered 2024-05-20: 1000 mL

## 2024-05-20 MED ORDER — SODIUM CHLORIDE 0.9 % IV SOLN
2.0000 g | Freq: Two times a day (BID) | INTRAVENOUS | Status: DC
  Administered 2024-05-21: 2 g via INTRAVENOUS
  Filled 2024-05-20: qty 12.5

## 2024-05-20 MED ORDER — PROSOURCE TF20 ENFIT COMPATIBL EN LIQD
60.0000 mL | Freq: Every day | ENTERAL | Status: DC
  Administered 2024-05-20 – 2024-05-23 (×4): 60 mL
  Filled 2024-05-20 (×4): qty 60

## 2024-05-20 MED ORDER — GABAPENTIN 250 MG/5ML PO SOLN
600.0000 mg | Freq: Every day | ORAL | Status: DC
  Administered 2024-05-20 – 2024-05-27 (×8): 600 mg
  Filled 2024-05-20 (×8): qty 12

## 2024-05-20 MED ORDER — PANTOPRAZOLE SODIUM 40 MG IV SOLR
40.0000 mg | INTRAVENOUS | Status: DC
Start: 2024-05-20 — End: 2024-05-22
  Administered 2024-05-20 – 2024-05-22 (×3): 40 mg via INTRAVENOUS
  Filled 2024-05-20 (×3): qty 10

## 2024-05-20 MED ORDER — ORAL CARE MOUTH RINSE
15.0000 mL | OROMUCOSAL | Status: DC
  Administered 2024-05-20 – 2024-05-23 (×35): 15 mL via OROMUCOSAL

## 2024-05-20 MED ORDER — ORAL CARE MOUTH RINSE: 15.0000 mL | OROMUCOSAL | Status: DC | PRN

## 2024-05-20 NOTE — Progress Notes (Signed)
 Initial Nutrition Assessment  DOCUMENTATION CODES:   Non-severe (moderate) malnutrition in context of chronic illness  INTERVENTION:   Tube Feeding via OG: Vital 1.5 at 20 ml/hr (trickle only today) TF goal: Vital 1.5 at 60 ml/hr with Pro-Source TF20 60 mL daily Goal TF regimen provides 137 g of protein, 2320 kcals, 1094 mL of free water   If no BM post initiation of TF, recommend increasing/adjusting bowel regimen  Recommend considering Cortrak tube placement  Currently receiving additional lipid calories via propofol  infusion  Check Phosphorus as Add-On to AM labs. No phosphorus since admission. Continue to monitor electrolytes  NUTRITION DIAGNOSIS:   Moderate Malnutrition related to chronic illness as evidenced by moderate muscle depletion, moderate fat depletion.  GOAL:   Patient will meet greater than or equal to 90% of their needs  MONITOR:   Vent status, Labs, Weight trends, TF tolerance  REASON FOR ASSESSMENT:   Consult, Ventilator Enteral/tube feeding initiation and management  ASSESSMENT:   63 yo male admitted for surgery with known RUL lung nodule with plan for lobectomy on 10/16. Pt with respiratory decline requiring transfer to ICU 10/21 and intubated on 10/26.  PMH includes COPD, Stroke, MI, GERD, HTN, Hep C, PUD, diverticulitis, HF, hx of polysubstance abuse (Cocaine, marijuana, EtOH), anxiety/depression, Bipolar 1 disorder, schizophrenia  10/16 OR: Robotic assisted R thoracoscopy, RUL wedge resection, R upper lobectomy, lymph node dissection 10/26 Worsening respiratory status requiring intubation, iNO started  Pt currently on vent support, sedated with fentanyl  and propofol  Levophed at 8 mcg/min Vasopressin  0.03 units/min  Recorded po intake 50-100% of meals between 10/19 and 10/22. Nothing recorded 10/23 or after. Family reports he was eating well until around Thursday/Friday of last week with significant respiratory decline. Family indicates pt  stating he could not eat because he needed to be able to breath.   +constipation, +flatus. BS present.   Current wt 113.1 kg (249.3 pounds). Admission wt 118.7 kg (262 pounds). Mother at bedside and reports recent UBW around 237 pounds.   UOP 2355 mL in 24 hours, net negative 11-12 L in admission per I/O flow sheet  Labs: CBGs 125-171 Sodium 135 (wdl) Potassium 4.6 (wdl) BUN 47 Creatinine 1.85  Meds: Colace BID Miralax  daily Lactulose prn Decadron    NUTRITION - FOCUSED PHYSICAL EXAM:  Flowsheet Row Most Recent Value  Orbital Region Moderate depletion  Upper Arm Region Unable to assess  Thoracic and Lumbar Region Unable to assess  Buccal Region Moderate depletion  Temple Region Moderate depletion  Clavicle Bone Region Moderate depletion  Clavicle and Acromion Bone Region Moderate depletion  Scapular Bone Region Moderate depletion  Dorsal Hand Unable to assess  Patellar Region Moderate depletion  Anterior Thigh Region Moderate depletion  Posterior Calf Region Moderate depletion  Edema (RD Assessment) Mild    Diet Order:   Diet Order             Diet NPO time specified  Diet effective now                   EDUCATION NEEDS:   Not appropriate for education at this time  Skin:  Skin Assessment: Skin Integrity Issues: Skin Integrity Issues:: Incisions Incisions: chest (surgical)  Last BM:  10/22  Height:   Ht Readings from Last 1 Encounters:  05/19/24 6' 3 (1.905 m)    Weight:   Wt Readings from Last 1 Encounters:  05/20/24 113.1 kg     BMI:  Body mass index is 31.17 kg/m.  Estimated Nutritional  Needs:   Kcal:  2200-2500 kcals  Protein:  125-145 g  Fluid:  >/= 2L   Betsey Finger MS, RDN, LDN, CNSC Registered Dietitian 3 Clinical Nutrition RD Inpatient Contact Info in Amion

## 2024-05-20 NOTE — Progress Notes (Signed)
 Vent changes made with Dr. Emery, CCM due to increasing auto peep.

## 2024-05-20 NOTE — Progress Notes (Addendum)
 PHARMACY - ANTICOAGULATION CONSULT NOTE  Pharmacy Consult for heparin  Indication: VTE  Allergies  Allergen Reactions   Asenapine Other (See Comments) and Nausea And Vomiting    Increased tremors   Dextromethorphan Hbr    Guaifenesin     Latuda [Lurasidone Hcl] Other (See Comments)    Tremors     Lurasidone Other (See Comments) and Hives    Other Reaction(s): Angioedema   Phenylephrine      Patient Measurements: Height: 6' 3 (190.5 cm) Weight: 113.1 kg (249 lb 5.4 oz) IBW/kg (Calculated) : 84.5  Vital Signs: Temp: 100.4 F (38 C) (10/27 0930) BP: 133/78 (10/27 0930) Pulse Rate: 90 (10/27 0930)  Labs: Recent Labs    05/18/24 0244 05/18/24 1444 05/19/24 0232 05/19/24 0821 05/19/24 1735 05/20/24 0030 05/20/24 0456 05/20/24 0500 05/20/24 0839  HGB 10.7*   < > 10.3*   < > 11.6*  --  11.2* 11.1*  --   HCT 31.1*   < > 30.1*   < > 34.0*  --  33.0* 32.8*  --   PLT 192  --  174  --   --   --   --  148*  --   HEPARINUNFRC  --   --   --   --   --  0.49  --   --  0.50  CREATININE 1.33*  --  1.22  --   --   --   --  1.85*  --    < > = values in this interval not displayed.    Estimated Creatinine Clearance: 55.4 mL/min (A) (by C-G formula based on SCr of 1.85 mg/dL (H)).   Medical History: Past Medical History:  Diagnosis Date   Anginal pain    Anxiety    Arthritis    Bipolar 1 disorder (HCC)    Bursitis    CAD (coronary artery disease)    Stent placed 2017   Cancer (HCC)    Chronic pain    COPD (chronic obstructive pulmonary disease) (HCC)    Current use of long term anticoagulation    DAPT (ASA + clopidogrel )   Depression    Diverticulitis    Dyspnea    GERD (gastroesophageal reflux disease)    Grade I diastolic dysfunction    Hepatitis C 2012   No longer has Hep C   HLD (hyperlipidemia)    Hypertension    MI (myocardial infarction) (HCC)    Polysubstance abuse (HCC)    cocaine, marijuana, ETOH   PUD (peptic ulcer disease)    S/P angioplasty with  stent 06/10/2016   a.) 90% stenosis of pLAD to mLAD - 2.5 x 18 mm Xience Alpine (DES x 1) placed to pLAD   S/P PTCA (percutaneous transluminal coronary angioplasty) 12/04/2019   a.) 60% in stent restenosis of DES to pLAD; LVEF 65%.   Schizophrenia (HCC)    Stroke (HCC)    Tremors    generalized   Valvular insufficiency    a.) Mild MR, TR, PR; mild to moderate AR on 03/05/2018 TTE      Assessment: 63 yoM admitted for RUL lobectomy. Pt intubated, now having difficulty with ventilation. Pharmacy to dose IV heparin  while ruling out VTE. Pt just received SQ enoxaparin  and had CVC placed so will defer bolus.  Confirmatory heparin  level 0.50 is therapeutic on 1500 units/hr.  Hgb 11.1 (stable), pltc 148 (trending down), no infusion issues.  LE dopplers pending.  Goal of Therapy:  Heparin  level 0.3-0.7 units/ml Monitor platelets by anticoagulation  protocol: Yes   Plan:  -Continue heparin  IV 1500 units/h  -Daily heparin  level, CBC -F/u LE dopplers  Maurilio Fila, PharmD Clinical Pharmacist 05/20/2024  9:40 AM

## 2024-05-20 NOTE — Progress Notes (Signed)
 eLink Physician-Brief Progress Note Patient Name: Keith Gibbs DOB: Dec 25, 1960 MRN: [id number]   Date of Service  05/20/2024  HPI/Events of Note  CBG 161,      CBG's are every 4 with trickle TF at 20cc started yesterday  eICU Interventions  Call back if CBG over 180, for ssi coverage. Watch for now.      Intervention Category Intermediate Interventions: Hyperglycemia - evaluation and treatment  Jodelle ONEIDA Hutching 05/20/2024, 11:55 PM

## 2024-05-20 NOTE — Progress Notes (Addendum)
 NAME:  Keith Gibbs, MRN:  [id number], DOB:  1960/08/25, LOS: 11 ADMISSION DATE:  05/09/2024, CONSULTATION DATE: 05/14/2024 REFERRING MD: CTS, CHIEF COMPLAINT: Acute on chronic hypoxic respiratory failure  History of Present Illness:  63 yo male presented for lobectomy with CTS. Progressing as expected post operatively with CT in place and was on 4E when this morning he decompensated with worsening hypoxemia and increased wob. CTS transferred to ICU and requested CCM evaluation.     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  He was difficult to oxygenate and ventilate therefore APRV started.  Patient seems to be tolerating the APRV.  Interim History / Subjective:  Patient tolerated APRV overnight.  He is still heavily sedated.  He still febrile.  Antibiotics started yesterday.  FiO2 is down to 50%.  ABG this morning has significant improvement.  He is still needing vasopressors on Levophed and vasopressin .  NG had 500 mL dark looking output.  Creatinine slightly elevated.   Objective    Blood pressure 138/81, pulse 86, temperature (!) 100.4 F (38 C), resp. rate 13, height 6' 3 (1.905 m), weight 113.1 kg, SpO2 96%. CVP:  [0 mmHg-12 mmHg] 5 mmHg  Vent Mode: Bi-Vent FiO2 (%):  [50 %-100 %] 50 % Set Rate:  [24 bmp-32 bmp] 24 bmp Vt Set:  [600 mL] 600 mL PEEP:  [5 cmH20-10 cmH20] 5 cmH20 Pressure Support:  [5 cmH20-10 cmH20] 10 cmH20 Plateau Pressure:  [28 cmH20-37 cmH20] 28 cmH20   Intake/Output Summary (Last 24 hours) at 05/20/2024 1049 Last data filed at 05/20/2024 0900 Gross per 24 hour  Intake  2336.58 ml  Output 3195 ml  Net -858.42 ml   Filed Weights   05/16/24 0600 05/17/24 0600 05/20/24 0545  Weight: 113.4 kg 111.6 kg 113.1 kg    Examination: General: Febrile diaphoretic sedated on mechanical ventilation. Lungs: Clear lungs no wheeze. 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 - Continue FiO2 50% for now. - Continue Pulmicort  - Continue Decadron  for ARDS and COPD. - DuoNebs every 4 hours - Monitor chest tube output.  Currently has a leak and is tidaling.  Septic shock - Continue broad-spectrum antibiotics for now with azithromycin  vancomycin  and meropenem. - Down Levophed continue vasopressin   Acute metabolic encephalopathy ICU delirium, in the setting of patient with a history of anxiety, and schizoaffective disorder bipolar type  -Currently on propofol  and use fentanyl  if need be. - As goal is 0 to -1. -  On Neurontin  and Seroquel . - Stop celocoxib and hold cymbalta  as we cannot crush the medication for tube - Hold Azithromycin   Acute Kidney Injury - likely pre renal from septic shock - Monitor renal functions and urine output. - Monitor electrolytes and replace accordingly. - If tube feeds  cannot be started will consider IV fluids.  Concerns for DVT Lower extremity - On empiric heparin  awaiting ultrasound bilateral Dopplers.  Increased NG output - Abdominal x-ray for now. - If output decreases we will start trophic feeds later today.  Anemia - Hemoglobin is down from 11.1 to 10.5 will monitor.   Full Code On heparin  gtt. On Protonix    Spoke with the mother and updated.   Labs   CBC: Recent Labs  Lab 05/16/24 0748 05/17/24 0417 05/17/24 1455 05/18/24 0244 05/18/24 1444 05/19/24 0232 05/19/24 0821 05/19/24 1557 05/19/24 1735 05/20/24 0456 05/20/24 0500 05/20/24 1019  WBC 7.4 10.5  --  14.5*  --  14.5*  --   --   --   --  16.2*  --   NEUTROABS  --   --   --   --   --  10.9*  --   --   --   --  13.3*  --   HGB 11.0* 11.2*   < > 10.7*   < > 10.3*   < > 11.6* 11.6* 11.2* 11.1* 10.5*  HCT 33.1* 32.6*   < > 31.1*   < > 30.1*   < > 34.0* 34.0* 33.0* 32.8* 31.0*  MCV 100.0 96.2  --  95.4  --  95.3  --   --   --   --  96.2  --   PLT 180 213  --  192  --  174  --   --   --   --  148*  --    < > = values in this interval not displayed.    Basic Metabolic Panel: Recent Labs  Lab 05/14/24 0550 05/15/24 0315 05/16/24 9251 05/17/24 0417 05/17/24 1455 05/18/24 0244 05/18/24 1444 05/19/24 0232 05/19/24 0821 05/19/24 1557 05/19/24 1735 05/20/24 0456 05/20/24 0500 05/20/24 1019  NA 130* 129* 131* 132*   < > 128*   < > 131*   < > 132* 131* 135 135 136  K 4.0 4.5 4.1 3.9   < > 4.3   < > 4.6   < > 5.4* 5.3* 4.6 4.6 4.6  CL 96* 95* 93* 92*  --  90*  --  91*  --   --   --   --  94*  --   CO2 24 24 24 28   --  28  --  26  --   --   --   --  25  --   GLUCOSE 127* 133* 102* 96  --  104*  --  121*  --   --   --   --  185*  --   BUN 17 31* 23 22  --  27*  --  27*  --   --   --   --  47*  --   CREATININE 1.03 1.23 0.91 0.96  --  1.33*  --  1.22  --   --   --   --  1.85*  --   CALCIUM  8.1* 7.8* 8.2* 8.1*  --  7.5*  --  7.6*  --   --   --   --  7.6*  --   MG 1.8  2.6*  --   --   --   --   --   --   --   --   --   --  2.6*  --   PHOS  --   --   --   --   --   --   --   --   --   --   --   --  7.1*  --    < > = values in this interval not displayed.   GFR: Estimated Creatinine Clearance: 55.4 mL/min (A) (by C-G formula based on SCr of 1.85 mg/dL (H)). Recent Labs  Lab 05/14/24 0809 05/15/24 0315 05/17/24 0417 05/17/24 1535 05/18/24 0244 05/19/24 0232 05/20/24 0500 05/20/24 0839  PROCALCITON 0.41  --   --  0.59  --   --   --   --   WBC  --    < > 10.5  --  14.5* 14.5* 16.2*  --   LATICACIDVEN  --   --   --   --   --   --   --  2.2*   < > = values in this interval not displayed.    Liver Function Tests: No results for input(s): AST, ALT, ALKPHOS, BILITOT, PROT, ALBUMIN  in the last 168 hours.  No results for input(s): LIPASE, AMYLASE in the last 168 hours. No results for input(s): AMMONIA in the last 168 hours.  ABG    Component Value Date/Time   PHART 7.380 05/20/2024 1019   PCO2ART 49.9 (H) 05/20/2024 1019   PO2ART 83 05/20/2024 1019   HCO3 29.2 (H) 05/20/2024 1019   TCO2 31 05/20/2024 1019   ACIDBASEDEF 1.0 05/19/2024 1154   O2SAT 95 05/20/2024 1019     Coagulation Profile: No results for input(s): INR, PROTIME in the last 168 hours.  Cardiac Enzymes: No results for input(s): CKTOTAL, CKMB, CKMBINDEX, TROPONINI in the last 168 hours.  HbA1C: Hgb A1c MFr Bld  Date/Time Value Ref Range Status  02/12/2024 08:40 AM 5.3 4.8 - 5.6 % Final    Comment:             Prediabetes: 5.7 - 6.4          Diabetes: >6.4          Glycemic control for adults with diabetes: <7.0   04/26/2022 03:30 PM 5.2 4.8 - 5.6 % Final    Comment:    (NOTE) Pre diabetes:          5.7%-6.4%  Diabetes:              >6.4%  Glycemic control for   <7.0% adults with diabetes     CBG: Recent Labs  Lab 05/19/24 1739 05/19/24 1918 05/19/24 2310 05/20/24 0326 05/20/24 0730  GLUCAP 144* 125* 171* 150* 150*    Review  of Systems:   Negative except above  Past Medical History:  He,  has a past medical history of Anginal pain, Anxiety, Arthritis, Bipolar 1 disorder (HCC), Bursitis, CAD (coronary artery disease), Cancer (HCC), Chronic pain, COPD (chronic obstructive pulmonary disease) (HCC), Current use of long term anticoagulation, Depression, Diverticulitis, Dyspnea, GERD (gastroesophageal reflux disease), Grade I diastolic dysfunction, Hepatitis C (2012), HLD (hyperlipidemia), Hypertension, MI (myocardial infarction) (HCC), Polysubstance abuse (HCC), PUD (peptic ulcer disease), S/P angioplasty with stent (06/10/2016), S/P PTCA (percutaneous transluminal coronary angioplasty) (12/04/2019), Schizophrenia (HCC), Stroke (HCC), Tremors, and Valvular insufficiency.   Surgical History:   Past Surgical History:  Procedure Laterality Date  ABDOMINAL SURGERY     removed small piece of intestines due to Meckles Diverticulosis   ANTERIOR LAT LUMBAR FUSION N/A 08/23/2022   Procedure: DIRECT LATERAL INTERBODY FUSION  LUMBAR TWO- LUMBAR THREE, LUMBAR THREE-LUMBAR FOUR , EXPLORE AND EXTEND FUSION LUMBAR TWO-LUMBAR FIVE, POSTERIOR DECOMPRESSION LUMBAR THREE-LUMBAR FOUR, LEFT LUMBAR TWO-LUMBAR THREE;  Surgeon: Debby Dorn MATSU, MD;  Location: [facility] OR;  Service: Neurosurgery;  Laterality: N/A;   APPENDECTOMY     BACK SURGERY     CARDIAC CATHETERIZATION Left 06/10/2016   Procedure: Left Heart Cath and Coronary Angiography;  Surgeon: Denyse DELENA Bathe, MD;  Location: [facility] INVASIVE CV LAB;  Service: Cardiovascular;  Laterality: Left;   CARDIAC CATHETERIZATION N/A 06/10/2016   Procedure: Coronary Stent Intervention;  Surgeon: Cara JONETTA Lovelace, MD;  Location: [facility] INVASIVE CV LAB;  Service: Cardiovascular;  Laterality: N/A;   CHOLECYSTECTOMY N/A 02/17/2022   Procedure: LAPAROSCOPIC CHOLECYSTECTOMY;  Surgeon: Stevie Herlene Righter, MD;  Location: [facility] OR;  Service: General;  Laterality: N/A;   COLON SURGERY     COLONOSCOPY      COLONOSCOPY WITH PROPOFOL  N/A 01/05/2017   Procedure: COLONOSCOPY WITH PROPOFOL ;  Surgeon: Therisa Bi, MD;  Location: [facility] ENDOSCOPY;  Service: Endoscopy;  Laterality: N/A;   COLONOSCOPY WITH PROPOFOL  N/A 02/13/2020   Procedure: COLONOSCOPY WITH PROPOFOL ;  Surgeon: Janalyn Keene NOVAK, MD;  Location: [facility] ENDOSCOPY;  Service: Endoscopy;  Laterality: N/A;   CORONARY ANGIOPLASTY WITH STENT PLACEMENT     CORONARY PRESSURE/FFR STUDY N/A 12/04/2019   Procedure: INTRAVASCULAR PRESSURE WIRE/FFR STUDY;  Surgeon: Wonda Sharper, MD;  Location: [facility] INVASIVE CV LAB;  Service: Cardiovascular;  Laterality: N/A;   ESOPHAGOGASTRODUODENOSCOPY (EGD) WITH PROPOFOL  N/A 01/05/2017   Procedure: ESOPHAGOGASTRODUODENOSCOPY (EGD) WITH PROPOFOL ;  Surgeon: Therisa Bi, MD;  Location: [facility] ENDOSCOPY;  Service: Endoscopy;  Laterality: N/A;   ESOPHAGOGASTRODUODENOSCOPY (EGD) WITH PROPOFOL  N/A 02/13/2020   Procedure: ESOPHAGOGASTRODUODENOSCOPY (EGD) WITH PROPOFOL ;  Surgeon: Janalyn Keene NOVAK, MD;  Location: [facility] ENDOSCOPY;  Service: Endoscopy;  Laterality: N/A;   INTERCOSTAL NERVE BLOCK  05/09/2024   Procedure: BLOCK, NERVE, INTERCOSTAL;  Surgeon: Kerrin Elspeth BROCKS, MD;  Location: [facility] OR;  Service: Thoracic;;   KNEE ARTHROSCOPY WITH MEDIAL MENISECTOMY Right 09/05/2017   Procedure: KNEE ARTHROSCOPY WITH MEDIAL AND LATERAL  MENISECTOMY PARTIAL SYNOVECTOMY;  Surgeon: Kathlynn Sharper, MD;  Location: [facility] ORS;  Service: Orthopedics;  Laterality: Right;   LEFT HEART CATH AND CORONARY ANGIOGRAPHY N/A 12/04/2019   Procedure: LEFT HEART CATH AND CORONARY ANGIOGRAPHY;  Surgeon: Wonda Sharper, MD;  Location: [facility] INVASIVE CV LAB;  Service: Cardiovascular;  Laterality: N/A;   LEFT HEART CATH AND CORONARY ANGIOGRAPHY Left 11/25/2022   Procedure: LEFT HEART CATH AND CORONARY ANGIOGRAPHY;  Surgeon: Bathe Denyse DELENA, MD;  Location: [facility] INVASIVE CV LAB;  Service: Cardiovascular;  Laterality: Left;   LOBECTOMY, LUNG, ROBOT-ASSISTED, USING  VATS Right 05/09/2024   Procedure: LOBECTOMY, LUNG RIGHT UPPER LOBE, ROBOT-ASSISTED, USING VATS;  Surgeon: Kerrin Elspeth BROCKS, MD;  Location: [facility] OR;  Service: Thoracic;  Laterality: Right;   LYMPH NODE BIOPSY  05/09/2024   Procedure: LYMPH NODE BIOPSY;  Surgeon: Kerrin Elspeth BROCKS, MD;  Location: [facility] OR;  Service: Thoracic;;   REVERSE SHOULDER ARTHROPLASTY Right 08/31/2023   Procedure: RIGHT REVERSE SHOULDER ARTHROPLASTY;  Surgeon: [name], MD;  Location: [facility] OR;  Service: Orthopedics;  Laterality: Right;   SHOULDER SURGERY Right 04/09/2012   SPINE SURGERY     TOTAL KNEE ARTHROPLASTY Right 01/19/2021   Procedure: TOTAL KNEE ARTHROPLASTY - [place] Amber to Assist;  Surgeon: Kathlynn Sharper, MD;  Location: [facility] ORS;  Service: Orthopedics;  Laterality: Right;   WEDGE RESECTION, LUNG, ROBOT-ASSISTED, THORACOSCOPIC Right 05/09/2024   Procedure: WEDGE RESECTION, LUNG RIGHT UPPER LOBE, ROBOT-ASSISTED, THORACOSCOPIC;  Surgeon: Kerrin Elspeth BROCKS, MD;  Location: [facility] OR;  Service: Thoracic;  Laterality: Right;     Social History:   reports that he quit smoking about 25 years ago. His smoking use included cigarettes. He started smoking about 45 years ago. He has a 63.8 pack-year smoking history. He has been exposed to tobacco smoke. He has never used smokeless tobacco. He reports that he does not currently use alcohol  after a past usage of about 3.0 standard drinks of alcohol  per week. He reports that he does not currently use drugs after having used the following drugs: Cocaine and Marijuana.   Family History:  His family history includes Depression in his mother; Early death in his father; Heart attack in his father; Heart disease in his father and mother; Hypertension in his father, mother, and sister; Osteoarthritis in his mother; Parkinson's disease in his maternal grandfather. There is no history of Prostate cancer, Bladder Cancer, Kidney cancer, or Tremor.   Allergies Allergies   Allergen Reactions   Asenapine Other (See Comments) and Nausea And Vomiting    Increased tremors   Dextromethorphan Hbr    Guaifenesin     Latuda [Lurasidone Hcl] Other (See Comments)    Tremors     Lurasidone Other (See Comments) and Hives    Other Reaction(s): Angioedema   Phenylephrine       Home Medications  Prior to Admission medications   Medication Sig Start Date End Date Taking? Authorizing Provider  acetaminophen  (TYLENOL ) 325 MG tablet Take 1 tablet (325 mg total) by mouth every 6 (six) hours as needed for mild pain (pain score 1-3) (or temp > 100.5). 09/01/23  Yes [name], MD  albuterol  (VENTOLIN  HFA) 108 [phone] Base) MCG/ACT inhaler Inhale 2 puffs into the lungs every 6 (six) hours as needed for wheezing or shortness of breath. 12/19/23  Yes Lorren Greig PARAS, NP  aspirin  EC 81 MG tablet Take 1 tablet (81 mg total) by mouth daily. Swallow whole. 02/03/23  Yes Evelena, Nadir, MD  atorvastatin  (LIPITOR ) 80 MG tablet TAKE 1 TABLET BY MOUTH EVERY DAY 01/15/24  Yes [name], MD  budesonide -glycopyrrolate -formoterol  (BREZTRI  AEROSPHERE) 160-9-4.8 MCG/ACT AERO inhaler Inhale 2 puffs into the lungs in the morning and at bedtime. 12/19/23  Yes Lorren, Amy J, NP  celecoxib  (CELEBREX ) 100 MG capsule TAKE 2 CAPSULES BY MOUTH TWICE A DAY 04/25/24  Yes Georgina Sharper LABOR, MD  divalproex  (DEPAKOTE ) 500 MG DR tablet Take half tablet in the morning and 3 tablets at bedtime Patient taking differently: Take half tablet in the morning, 1 in the afternoon, and 2 tablets at bedtime 04/25/24  Yes Izella Ismael NOVAK, MD  DULoxetine  (CYMBALTA ) 30 MG capsule Take 3 capsules (90 mg total) by mouth daily. Patient taking differently: Take 30 mg by mouth 3 (three) times daily. 04/25/24 06/24/24 Yes Izella Ismael NOVAK, MD  gabapentin  (NEURONTIN ) 600 MG tablet Take 1 tablet (600 mg total) by mouth daily AND 2 tablets (1,200 mg total) at bedtime. 04/25/24  Yes Izella Ismael NOVAK, MD  hydrOXYzine  (ATARAX ) 25 MG  tablet Take 1 tablet (25 mg total) by mouth 2 (two) times daily as needed. Patient taking differently: Take 25 mg by mouth daily. 04/25/24  Yes Izella Ismael NOVAK, MD  isosorbide  mononitrate (IMDUR ) 30 MG 24 hr tablet  TAKE 1 TABLET BY MOUTH 2 TIMES DAILY. 04/09/24  Yes Fernand Denyse LABOR, MD  Multiple Vitamins-Minerals (MENS 50+ MULTIVITAMIN) TABS Take 1 tablet by mouth in the morning.   Yes [provider]  nicotine  polacrilex (NICOTINE  MINI) 2 MG lozenge Take 1 lozenge (2 mg total) by mouth every 2 (two) hours as needed for smoking cessation. 03/26/24 06/24/24 Yes Dgayli, Belva, MD  nitroGLYCERIN  (NITROSTAT ) 0.4 MG SL tablet Place 1 tablet (0.4 mg total) under the tongue every 5 (five) minutes as needed for chest pain. 02/03/23 05/06/24 Yes Evelena Figures, MD  pantoprazole  (PROTONIX ) 40 MG tablet Take 1 tablet (40 mg total) by mouth daily. 12/19/23  Yes Lorren, Amy J, NP  QUEtiapine  (SEROQUEL ) 200 MG tablet Take 1 tablet (200 mg total) by mouth at bedtime. 04/25/24  Yes Izella Ismael NOVAK, MD  traZODone  (DESYREL ) 100 MG tablet Take 0.5 tablets (50 mg total) by mouth at bedtime for 7 days. Patient not taking: Reported on 05/06/2024 04/25/24 05/02/24  Izella Ismael NOVAK, MD             My critical care time: 45 minutes  Critical care time was exclusive of separately billable procedures and treating other patients.  Critical care was necessary to treat or prevent imminent or life-threatening deterioration.  Critical care was time spent personally by me on the following activities: development of treatment plan with patient and/or surrogate as well as nursing, discussions with consultants, evaluation of patient's response to treatment, examination of patient, obtaining history from patient or surrogate, ordering and performing treatments and interventions, ordering and review of laboratory studies, ordering and review of radiographic studies, pulse oximetry, re-evaluation of patient's condition and  participation in multidisciplinary rounds.   05/20/2024, 10:49 AM  Tamela Stakes, MD  Attending Physician, Critical Care Medicine [facility] See Amion for pager If no response to pager, please call [phone] until 7pm After 7pm, Please call E-link [phone]

## 2024-05-20 NOTE — Progress Notes (Signed)
 PHARMACY - ANTICOAGULATION CONSULT NOTE  Pharmacy Consult for heparin  Indication: r/o PE  Labs: Recent Labs    05/17/24 0417 05/17/24 1455 05/18/24 0244 05/18/24 1444 05/19/24 0232 05/19/24 0821 05/19/24 1427 05/19/24 1557 05/19/24 1735 05/20/24 0030  HGB 11.2*   < > 10.7*   < > 10.3*   < > 11.9* 11.6* 11.6*  --   HCT 32.6*   < > 31.1*   < > 30.1*   < > 35.0* 34.0* 34.0*  --   PLT 213  --  192  --  174  --   --   --   --   --   HEPARINUNFRC  --   --   --   --   --   --   --   --   --  0.49  CREATININE 0.96  --  1.33*  --  1.22  --   --   --   --   --    < > = values in this interval not displayed.  Assessment/Plan:  63yo male therapeutic on heparin  with initial dosing for possible PE. Will continue infusion at current rate of 1500 units/hr and confirm stable with additional level.  Marvetta Dauphin, PharmD, BCPS 05/20/2024 1:36 AM

## 2024-05-20 NOTE — Progress Notes (Signed)
 TCTS Evening Rounds:  Hemodynamically stable off NE. Still on vaso 0.02  Sedated. Remains stable on vent.   UO good.

## 2024-05-20 NOTE — Progress Notes (Signed)
 11 Days Post-Op Procedure(s) (LRB): WEDGE RESECTION, LUNG RIGHT UPPER LOBE, ROBOT-ASSISTED, THORACOSCOPIC (Right) LOBECTOMY, LUNG RIGHT UPPER LOBE, ROBOT-ASSISTED, USING VATS (Right) LYMPH NODE BIOPSY BLOCK, NERVE, INTERCOSTAL Subjective: Intubated, sedated  Objective: Vital signs in last 24 hours: Temp:  [98.4 F (36.9 C)-102.6 F (39.2 C)] 101.1 F (38.4 C) (10/27 0615) Pulse Rate:  [58-127] 72 (10/27 0615) Cardiac Rhythm: Normal sinus rhythm (10/26 2000) Resp:  [10-37] 11 (10/27 0615) BP: (83-173)/(49-95) 113/60 (10/27 0615) SpO2:  [40 %-100 %] 99 % (10/27 0615) Arterial Line BP: (80-116)/(50-67) 93/51 (10/27 0615) FiO2 (%):  [60 %-100 %] 60 % (10/27 0329) Weight:  [113.1 kg] 113.1 kg (10/27 0545)  Hemodynamic parameters for last 24 hours: CVP:  [0 mmHg-12 mmHg] 7 mmHg  Intake/Output from previous day: 10/26 0701 - 10/27 0700 In: 2167.6 [I.V.:1144.8; NG/GT:80; IV Piggyback:892.9] Out: 2930 [Urine:2355; Emesis/NG output:375; Chest Tube:200] Intake/Output this shift: No intake/output data recorded.  General appearance: intubated Neurologic: sedated Heart: regular rate and rhythm Lungs: clear to auscultation bilaterally Abdomen: soft Small air leak  Lab Results: Recent Labs    05/19/24 0232 05/19/24 0821 05/20/24 0456 05/20/24 0500  WBC 14.5*  --   --  16.2*  HGB 10.3*   < > 11.2* 11.1*  HCT 30.1*   < > 33.0* 32.8*  PLT 174  --   --  148*   < > = values in this interval not displayed.   BMET:  Recent Labs    05/19/24 0232 05/19/24 0821 05/20/24 0456 05/20/24 0500  NA 131*   < > 135 135  K 4.6   < > 4.6 4.6  CL 91*  --   --  94*  CO2 26  --   --  25  GLUCOSE 121*  --   --  185*  BUN 27*  --   --  47*  CREATININE 1.22  --   --  1.85*  CALCIUM  7.6*  --   --  7.6*   < > = values in this interval not displayed.    PT/INR: No results for input(s): LABPROT, INR in the last 72 hours. ABG    Component Value Date/Time   PHART 7.343 (L) 05/20/2024  0456   HCO3 28.1 (H) 05/20/2024 0456   TCO2 30 05/20/2024 0456   ACIDBASEDEF 1.0 05/19/2024 1154   O2SAT 98 05/20/2024 0456   CBG (last 3)  Recent Labs    05/19/24 2310 05/20/24 0326 05/20/24 0730  GLUCAP 171* 150* 150*    Assessment/Plan: S/P Procedure(s) (LRB): WEDGE RESECTION, LUNG RIGHT UPPER LOBE, ROBOT-ASSISTED, THORACOSCOPIC (Right) LOBECTOMY, LUNG RIGHT UPPER LOBE, ROBOT-ASSISTED, USING VATS (Right) LYMPH NODE BIOPSY BLOCK, NERVE, INTERCOSTAL Events of weekend noted NEURO- sedated CV- In SR  On vasopressin  0.03, norepi 8, CVP 7 RESP- VDRF- required intubation yesterday   Acute on chronic respiratory failure  Down to 60% and 5 PEEP, NO at 20  7.34/53/124/30  Vent per CCM  S/p RUL- apical space unchanged, + persistent air leak ID_ febrile to 101.5, presumed pneumonia  Antibiotics broadened over the weekend  On Vanco, meropenem, and azithromycin   Septic physiology requiring pressors RENAL- creatinine up to 1.85 and BUN 47  AKI secondary to sepsis, diuresis  Hold Lasix for now ENDO- CBG moderately elevated GI- will need to address nutrition DVT prophylaxis- started on IV heparin  Critically ill   LOS: 11 days    Keith Gibbs 05/20/2024

## 2024-05-21 ENCOUNTER — Inpatient Hospital Stay (HOSPITAL_COMMUNITY): Payer: MEDICAID

## 2024-05-21 DIAGNOSIS — A419 Sepsis, unspecified organism: Secondary | ICD-10-CM | POA: Diagnosis not present

## 2024-05-21 DIAGNOSIS — A4189 Other specified sepsis: Secondary | ICD-10-CM

## 2024-05-21 DIAGNOSIS — J441 Chronic obstructive pulmonary disease with (acute) exacerbation: Secondary | ICD-10-CM | POA: Diagnosis not present

## 2024-05-21 DIAGNOSIS — J80 Acute respiratory distress syndrome: Secondary | ICD-10-CM | POA: Diagnosis not present

## 2024-05-21 DIAGNOSIS — Z902 Acquired absence of lung [part of]: Secondary | ICD-10-CM | POA: Diagnosis not present

## 2024-05-21 DIAGNOSIS — J188 Other pneumonia, unspecified organism: Secondary | ICD-10-CM

## 2024-05-21 LAB — HEPARIN LEVEL (UNFRACTIONATED): Heparin Unfractionated: 0.31 [IU]/mL (ref 0.30–0.70)

## 2024-05-21 LAB — PHOSPHORUS: Phosphorus: 3.2 mg/dL (ref 2.5–4.6)

## 2024-05-21 LAB — POCT I-STAT 7, (LYTES, BLD GAS, ICA,H+H)
Acid-Base Excess: 6 mmol/L — ABNORMAL HIGH (ref 0.0–2.0)
Bicarbonate: 31.6 mmol/L — ABNORMAL HIGH (ref 20.0–28.0)
Calcium, Ion: 1.09 mmol/L — ABNORMAL LOW (ref 1.15–1.40)
HCT: 30 % — ABNORMAL LOW (ref 39.0–52.0)
Hemoglobin: 10.2 g/dL — ABNORMAL LOW (ref 13.0–17.0)
O2 Saturation: 91 %
Patient temperature: 37.3
Potassium: 4.6 mmol/L (ref 3.5–5.1)
Sodium: 140 mmol/L (ref 135–145)
TCO2: 33 mmol/L — ABNORMAL HIGH (ref 22–32)
pCO2 arterial: 53 mmHg — ABNORMAL HIGH (ref 32–48)
pH, Arterial: 7.384 (ref 7.35–7.45)
pO2, Arterial: 63 mmHg — ABNORMAL LOW (ref 83–108)

## 2024-05-21 LAB — CBC WITH DIFFERENTIAL/PLATELET
Abs Immature Granulocytes: 1.4 K/uL — ABNORMAL HIGH (ref 0.00–0.07)
Basophils Absolute: 0.1 K/uL (ref 0.0–0.1)
Basophils Relative: 1 %
Eosinophils Absolute: 0 K/uL (ref 0.0–0.5)
Eosinophils Relative: 0 %
HCT: 30.2 % — ABNORMAL LOW (ref 39.0–52.0)
Hemoglobin: 10.3 g/dL — ABNORMAL LOW (ref 13.0–17.0)
Immature Granulocytes: 9 %
Lymphocytes Relative: 9 %
Lymphs Abs: 1.5 K/uL (ref 0.7–4.0)
MCH: 32.9 pg (ref 26.0–34.0)
MCHC: 34.1 g/dL (ref 30.0–36.0)
MCV: 96.5 fL (ref 80.0–100.0)
Monocytes Absolute: 1.4 K/uL — ABNORMAL HIGH (ref 0.1–1.0)
Monocytes Relative: 9 %
Neutro Abs: 11.7 K/uL — ABNORMAL HIGH (ref 1.7–7.7)
Neutrophils Relative %: 72 %
Platelets: 177 K/uL (ref 150–400)
RBC: 3.13 MIL/uL — ABNORMAL LOW (ref 4.22–5.81)
RDW: 14.4 % (ref 11.5–15.5)
Smear Review: NORMAL
WBC: 16.2 K/uL — ABNORMAL HIGH (ref 4.0–10.5)
nRBC: 0 % (ref 0.0–0.2)

## 2024-05-21 LAB — GLUCOSE, CAPILLARY
Glucose-Capillary: 149 mg/dL — ABNORMAL HIGH (ref 70–99)
Glucose-Capillary: 158 mg/dL — ABNORMAL HIGH (ref 70–99)
Glucose-Capillary: 162 mg/dL — ABNORMAL HIGH (ref 70–99)
Glucose-Capillary: 166 mg/dL — ABNORMAL HIGH (ref 70–99)
Glucose-Capillary: 177 mg/dL — ABNORMAL HIGH (ref 70–99)
Glucose-Capillary: 181 mg/dL — ABNORMAL HIGH (ref 70–99)

## 2024-05-21 LAB — BASIC METABOLIC PANEL WITH GFR
Anion gap: 10 (ref 5–15)
BUN: 48 mg/dL — ABNORMAL HIGH (ref 8–23)
CO2: 29 mmol/L (ref 22–32)
Calcium: 7.6 mg/dL — ABNORMAL LOW (ref 8.9–10.3)
Chloride: 100 mmol/L (ref 98–111)
Creatinine, Ser: 1.12 mg/dL (ref 0.61–1.24)
GFR, Estimated: >60 mL/min (ref >60)
Glucose, Bld: 161 mg/dL — ABNORMAL HIGH (ref 70–99)
Potassium: 4.7 mmol/L (ref 3.5–5.1)
Sodium: 139 mmol/L (ref 135–145)

## 2024-05-21 LAB — MAGNESIUM: Magnesium: 3.3 mg/dL — ABNORMAL HIGH (ref 1.7–2.4)

## 2024-05-21 MED ORDER — INSULIN ASPART 100 UNIT/ML IJ SOLN
0.0000 [IU] | INTRAMUSCULAR | Status: DC
  Administered 2024-05-21 – 2024-05-22 (×5): 1 [IU] via SUBCUTANEOUS

## 2024-05-21 MED ORDER — ACETAMINOPHEN 160 MG/5ML PO SOLN
650.0000 mg | Freq: Four times a day (QID) | ORAL | Status: AC
  Administered 2024-05-21 – 2024-05-22 (×5): 650 mg
  Filled 2024-05-21 (×6): qty 20.3

## 2024-05-21 MED ORDER — FUROSEMIDE 10 MG/ML IJ SOLN
40.0000 mg | Freq: Once | INTRAMUSCULAR | Status: AC
Start: 2024-05-21 — End: 2024-05-21
  Administered 2024-05-21: 40 mg via INTRAVENOUS
  Filled 2024-05-21: qty 4

## 2024-05-21 MED ORDER — SODIUM CHLORIDE 0.9 % IV SOLN
2.0000 g | Freq: Three times a day (TID) | INTRAVENOUS | Status: DC
  Administered 2024-05-21 – 2024-05-22 (×4): 2 g via INTRAVENOUS
  Filled 2024-05-21 (×4): qty 12.5

## 2024-05-21 MED ORDER — FUROSEMIDE 10 MG/ML IJ SOLN
40.0000 mg | Freq: Once | INTRAMUSCULAR | Status: AC
  Administered 2024-05-21: 40 mg via INTRAVENOUS
  Filled 2024-05-21: qty 4

## 2024-05-21 MED ORDER — VITAL 1.5 CAL PO LIQD
1000.0000 mL | ORAL | Status: DC
  Administered 2024-05-21 – 2024-05-27 (×7): 1000 mL

## 2024-05-21 MED ORDER — VANCOMYCIN HCL 1500 MG/300ML IV SOLN
1500.0000 mg | Freq: Two times a day (BID) | INTRAVENOUS | Status: DC
  Administered 2024-05-21 – 2024-05-22 (×2): 1500 mg via INTRAVENOUS
  Filled 2024-05-21 (×4): qty 300

## 2024-05-21 NOTE — Progress Notes (Signed)
   [address] [place] [employer] [address]             [phone]    Temp 100.6 earlier, now 100.0  BP (!) 109/52   Pulse 84   Temp 100 F (37.8 C)   Resp (!) 23   Ht 6' 3 (1.905 m)   Wt 111.5 kg   SpO2 97%   BMI 30.72 kg/m  CVP 2 FiO2 up to 80% on APRV  Intake/Output Summary (Last 24 hours) at 05/21/2024 1749 Last data filed at 05/21/2024 1630 Gross per 24 hour  Intake 2623.62 ml  Output 3265 ml  Net -641.38 ml   Continue current care  Chaslyn Eisen C. Kerrin, MD [facility] [phone]

## 2024-05-21 NOTE — Progress Notes (Signed)
 PHARMACY - ANTICOAGULATION CONSULT NOTE  Pharmacy Consult for heparin  Indication: VTE  Allergies  Allergen Reactions   Asenapine Other (See Comments) and Nausea And Vomiting    Increased tremors   Dextromethorphan Hbr    Guaifenesin     Latuda [Lurasidone Hcl] Other (See Comments)    Tremors     Lurasidone Other (See Comments) and Hives    Other Reaction(s): Angioedema   Phenylephrine      Patient Measurements: Height: 6' 3 (190.5 cm) Weight: 111.5 kg (245 lb 13 oz) IBW/kg (Calculated) : 84.5  Vital Signs: Temp: 100.6 F (38.1 C) (10/28 1126) Temp Source: Bladder (10/28 0730) BP: 108/55 (10/28 1126) Pulse Rate: 90 (10/28 1126)  Labs: Recent Labs    05/19/24 0232 05/19/24 0821 05/20/24 0030 05/20/24 0456 05/20/24 0500 05/20/24 0839 05/20/24 1019 05/21/24 0421  HGB 10.3*   < >  --    < > 11.1*  --  10.5* 10.3*  HCT 30.1*   < >  --    < > 32.8*  --  31.0* 30.2*  PLT 174  --   --   --  148*  --   --  177  HEPARINUNFRC  --   --  0.49  --   --  0.50  --  0.31  CREATININE 1.22  --   --   --  1.85*  --   --  1.12   < > = values in this interval not displayed.    Estimated Creatinine Clearance: 91 mL/min (by C-G formula based on SCr of 1.12 mg/dL).   Medical History: Past Medical History:  Diagnosis Date   Anginal pain    Anxiety    Arthritis    Bipolar 1 disorder (HCC)    Bursitis    CAD (coronary artery disease)    Stent placed 2017   Cancer (HCC)    Chronic pain    COPD (chronic obstructive pulmonary disease) (HCC)    Current use of long term anticoagulation    DAPT (ASA + clopidogrel )   Depression    Diverticulitis    Dyspnea    GERD (gastroesophageal reflux disease)    Grade I diastolic dysfunction    Hepatitis C 2012   No longer has Hep C   HLD (hyperlipidemia)    Hypertension    MI (myocardial infarction) (HCC)    Polysubstance abuse (HCC)    cocaine, marijuana, ETOH   PUD (peptic ulcer disease)    S/P angioplasty with stent 06/10/2016    a.) 90% stenosis of pLAD to mLAD - 2.5 x 18 mm Xience Alpine (DES x 1) placed to pLAD   S/P PTCA (percutaneous transluminal coronary angioplasty) 12/04/2019   a.) 60% in stent restenosis of DES to pLAD; LVEF 65%.   Schizophrenia (HCC)    Stroke (HCC)    Tremors    generalized   Valvular insufficiency    a.) Mild MR, TR, PR; mild to moderate AR on 03/05/2018 TTE      Assessment: 63 yoM admitted for RUL lobectomy. Pt intubated, now having difficulty with ventilation. Pharmacy to dose IV heparin  while ruling out VTE. Pt just received SQ enoxaparin  and had CVC placed so will defer bolus.  Heparin  level 0.31 remains therapeutic on 1500 units/hr.  Hgb 10.3, pltc 177, no infusion issues.  LE dopplers revealed acute thrombosis of R gastrocnemius veins.    Goal of Therapy:  Heparin  level 0.3-0.7 units/ml Monitor platelets by anticoagulation protocol: Yes   Plan:  -Increase heparin   IV slightly to 1550 units/hr to stay in goal range  -Daily heparin  level, CBC -F/u plans for CTA for r/o PE  Maurilio Fila, PharmD Clinical Pharmacist 05/21/2024  1:11 PM

## 2024-05-21 NOTE — Progress Notes (Signed)
 NAME:  Keith Gibbs, MRN:  [id number], DOB:  08-27-1960, LOS: 12 ADMISSION DATE:  05/09/2024, CONSULTATION DATE: 05/14/2024 REFERRING MD: CTS, CHIEF COMPLAINT: Acute on chronic hypoxic respiratory failure  History of Present Illness:  63 yo male presented for lobectomy with CTS. Progressing as expected post operatively with CT in place and was on 4E when this morning he decompensated with worsening hypoxemia and increased wob. CTS transferred to ICU and requested CCM evaluation.     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  He was difficult to oxygenate and ventilate therefore APRV started.  Patient seems to be tolerating the APRV.  Interim History / Subjective:  Patient had a lot of condensation in his tubing of the Ventilator. FiO2 went up to 70%. Cxr this am had no significant changes.  He has no secretions on inline suction.   Objective    Blood pressure (!) 108/55, pulse 79, temperature 99.7 F (37.6 C), resp. rate (!) 21, height 6' 3 (1.905 m), weight 111.5 kg, SpO2 98%. CVP:  [0 mmHg-24 mmHg] 5 mmHg  Vent Mode: Bi-Vent FiO2 (%):  [50 %-70 %] 70 % Set Rate:  [24 bmp] 24 bmp Vt Set:  [600 mL] 600 mL PEEP:  [5 cmH20] 5 cmH20 Pressure Support:  [10 cmH20] 10 cmH20 Plateau Pressure:  [22 cmH20] 22 cmH20   Intake/Output Summary (Last 24 hours) at 05/21/2024 1441 Last data filed at 05/21/2024 1300 Gross per 24 hour  Intake 3153.38 ml  Output 3100 ml  Net 53.38 ml   Filed Weights   05/17/24 0600 05/20/24 0545 05/21/24 0359  Weight: 111.6 kg 113.1 kg 111.5 kg    Examination: General:  Febrile diaphoretic sedated on mechanical ventilation. Lungs: Clear lungs no wheeze.  Diminished breath sound right side. Cardiovascular: S1-S2 heard no S3 no murmur Abdomen: Soft nontender nondistended Extremities: No trauma mild pedal edema.  No deformities. 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  No high fevers today.  Current temperature is 99.7.  Acute hypoxic hypercapnic respiratory failure ARDS Status post right upper lobe pneumonectomy Acute COPD exasperation Acute pulmonary edema Possible aspiration Adenocarcinoma of the lung status post right upper lobe lobectomy Right pneumothorax was pneumonectomy status post chest tube placement  - Continue APRV today -went up on P high from 26 to 28. - Continue FiO2 80% for now. - Continue Pulmicort  - Continue Decadron  for ARDS and COPD. - DuoNebs every 4 hours - Monitor chest tube output.  Currently has a leak and is tidaling. - 1 dose of Lasix 40 IV today. - Wean down nitric oxide.  Septic shock - Continue broad-spectrum antibiotics for now with azithromycin  vancomycin  and meropenem. - Down Levophed continue vasopressin   Acute metabolic encephalopathy ICU delirium, in the setting of patient with a history of anxiety, and schizoaffective disorder bipolar type  - Currently on propofol  and use fentanyl  if  need be. - As goal is 0 to -1. - On Neurontin  and Seroquel . - Stop celocoxib and hold cymbalta  as we cannot crush the medication for tube - Hold Azithromycin   Acute Kidney Injury - likely pre renal from septic shock - Monitor renal functions and urine output. - Monitor electrolytes and replace accordingly. - If tube feeds cannot be  started will consider IV fluids.  Concerns for DVT Lower extremity - On empiric heparin   - Doppler shows lower extremity DVT in gastronemius vein - Will get CT angio thorax when we are able to -as the patient is off nitric oxide.  Increased NG output - Abdominal x-ray did not show any ileus. - Tube feeds are ongoing.  We can increase the rate towards goal.  Anemia - Hemoglobin is down from 11.1 to 10.5 will monitor.   Full Code On heparin  gtt. On Protonix    Spoke with the mother and updated.   Labs   CBC: Recent Labs  Lab 05/17/24 0417 05/17/24 1455 05/18/24 0244 05/18/24 1444 05/19/24 0232 05/19/24 0821 05/20/24 0456 05/20/24 0500 05/20/24 1019 05/21/24 0421 05/21/24 1424  WBC 10.5  --  14.5*  --  14.5*  --   --  16.2*  --  16.2*  --   NEUTROABS  --   --   --   --  10.9*  --   --  13.3*  --  11.7*  --   HGB 11.2*   < > 10.7*   < > 10.3*   < > 11.2* 11.1* 10.5* 10.3* 10.2*  HCT 32.6*   < > 31.1*   < > 30.1*   < > 33.0* 32.8* 31.0* 30.2* 30.0*  MCV 96.2  --  95.4  --  95.3  --   --  96.2  --  96.5  --   PLT 213  --  192  --  174  --   --  148*  --  177  --    < > = values in this interval not displayed.    Basic Metabolic Panel: Recent Labs  Lab 05/15/24 0315 05/16/24 0748 05/17/24 0417 05/17/24 1455 05/18/24 0244 05/18/24 1444 05/19/24 0232 05/19/24 0821 05/20/24 0456 05/20/24 0500 05/20/24 1019 05/21/24 0421 05/21/24 1424  NA 129*   < > 132*   < > 128*   < > 131*   < > 135 135 136 139 140  K 4.5   < > 3.9   < > 4.3   < > 4.6   < > 4.6 4.6 4.6 4.7 4.6  CL 95*   < > 92*  --  90*  --  91*  --   --  94*  --  100  --   CO2 24   < > 28  --  28  --  26  --   --  25  --  29  --   GLUCOSE 133*   < > 96  --  104*  --  121*  --   --  185*  --  161*  --   BUN 31*   < > 22  --  27*  --  27*  --   --  47*  --  48*  --   CREATININE 1.23   < > 0.96  --  1.33*  --  1.22  --   --  1.85*  --  1.12  --   CALCIUM  7.8*   < > 8.1*  --  7.5*  --  7.6*  --   --  7.6*  --   7.6*  --   MG 2.6*  --   --   --   --   --   --   --   --  2.6*  --  3.3*  --   PHOS  --   --   --   --   --   --   --   --   --  7.1*  --  3.2  --    < > = values in this interval not displayed.   GFR: Estimated Creatinine Clearance: 91 mL/min (by C-G formula based on SCr of 1.12 mg/dL). Recent Labs  Lab 05/17/24 1535 05/18/24 0244 05/19/24 0232 05/20/24 0500 05/20/24 0839 05/20/24 1140 05/21/24 0421  PROCALCITON 0.59  --   --   --   --   --   --   WBC  --  14.5* 14.5* 16.2*  --   --  16.2*  LATICACIDVEN  --   --   --   --  2.2* 2.0*  --     Liver Function Tests: No results for input(s): AST, ALT, ALKPHOS, BILITOT, PROT, ALBUMIN  in the last 168 hours.  No results for input(s): LIPASE, AMYLASE in the last 168 hours. No results for input(s): AMMONIA in the last 168 hours.  ABG    Component Value Date/Time   PHART 7.384 05/21/2024 1424   PCO2ART 53.0 (H) 05/21/2024 1424   PO2ART 63 (L) 05/21/2024 1424   HCO3 31.6 (H) 05/21/2024 1424   TCO2 33 (H) 05/21/2024 1424   ACIDBASEDEF 1.0 05/19/2024 1154   O2SAT 91 05/21/2024 1424     Coagulation Profile: No results for input(s): INR, PROTIME in the last 168 hours.  Cardiac Enzymes: No results for input(s): CKTOTAL, CKMB, CKMBINDEX, TROPONINI in the last 168 hours.  HbA1C: Hgb A1c MFr Bld  Date/Time Value Ref Range Status  02/12/2024 08:40 AM 5.3 4.8 - 5.6 % Final    Comment:             Prediabetes: 5.7 - 6.4          Diabetes: >6.4          Glycemic control for adults with diabetes: <7.0   04/26/2022 03:30 PM 5.2 4.8 - 5.6 % Final    Comment:    (NOTE) Pre diabetes:          5.7%-6.4%  Diabetes:              >6.4%  Glycemic control for   <7.0% adults with diabetes     CBG: Recent Labs  Lab 05/20/24 1948 05/20/24 2325 05/21/24 0351 05/21/24 0815 05/21/24 1139  GLUCAP 160* 173* 149* 158* 162*    Review of Systems:   Negative except above  Past Medical History:  He,   has a past medical history of Anginal pain, Anxiety, Arthritis, Bipolar 1 disorder (HCC), Bursitis, CAD (coronary artery disease), Cancer (HCC), Chronic pain, COPD (chronic obstructive pulmonary disease) (HCC), Current use of long term anticoagulation, Depression, Diverticulitis, Dyspnea, GERD (gastroesophageal reflux disease), Grade I diastolic dysfunction, Hepatitis C (2012), HLD (hyperlipidemia), Hypertension, MI (myocardial infarction) (HCC), Polysubstance abuse (HCC), PUD (peptic ulcer disease), S/P angioplasty with stent (06/10/2016), S/P PTCA (percutaneous transluminal coronary angioplasty) (12/04/2019), Schizophrenia (HCC), Stroke (HCC), Tremors, and Valvular insufficiency.   Surgical History:   Past Surgical History:  Procedure Laterality Date   ABDOMINAL SURGERY     removed small  piece of intestines due to Meckles Diverticulosis   ANTERIOR LAT LUMBAR FUSION N/A 08/23/2022   Procedure: DIRECT LATERAL INTERBODY FUSION  LUMBAR TWO- LUMBAR THREE, LUMBAR THREE-LUMBAR FOUR , EXPLORE AND EXTEND FUSION LUMBAR TWO-LUMBAR FIVE, POSTERIOR DECOMPRESSION LUMBAR THREE-LUMBAR FOUR, LEFT LUMBAR TWO-LUMBAR THREE;  Surgeon: Debby Dorn MATSU, MD;  Location: [facility] OR;  Service: Neurosurgery;  Laterality: N/A;   APPENDECTOMY     BACK SURGERY     CARDIAC CATHETERIZATION Left 06/10/2016   Procedure: Left Heart Cath and Coronary Angiography;  Surgeon: Denyse DELENA Bathe, MD;  Location: [facility] INVASIVE CV LAB;  Service: Cardiovascular;  Laterality: Left;   CARDIAC CATHETERIZATION N/A 06/10/2016   Procedure: Coronary Stent Intervention;  Surgeon: Cara JONETTA Lovelace, MD;  Location: [facility] INVASIVE CV LAB;  Service: Cardiovascular;  Laterality: N/A;   CHOLECYSTECTOMY N/A 02/17/2022   Procedure: LAPAROSCOPIC CHOLECYSTECTOMY;  Surgeon: Stevie Herlene Righter, MD;  Location: [facility] OR;  Service: General;  Laterality: N/A;   COLON SURGERY     COLONOSCOPY     COLONOSCOPY WITH PROPOFOL  N/A 01/05/2017   Procedure: COLONOSCOPY WITH  PROPOFOL ;  Surgeon: Therisa Bi, MD;  Location: [facility] ENDOSCOPY;  Service: Endoscopy;  Laterality: N/A;   COLONOSCOPY WITH PROPOFOL  N/A 02/13/2020   Procedure: COLONOSCOPY WITH PROPOFOL ;  Surgeon: Janalyn Keene NOVAK, MD;  Location: [facility] ENDOSCOPY;  Service: Endoscopy;  Laterality: N/A;   CORONARY ANGIOPLASTY WITH STENT PLACEMENT     CORONARY PRESSURE/FFR STUDY N/A 12/04/2019   Procedure: INTRAVASCULAR PRESSURE WIRE/FFR STUDY;  Surgeon: Wonda Sharper, MD;  Location: [facility] INVASIVE CV LAB;  Service: Cardiovascular;  Laterality: N/A;   ESOPHAGOGASTRODUODENOSCOPY (EGD) WITH PROPOFOL  N/A 01/05/2017   Procedure: ESOPHAGOGASTRODUODENOSCOPY (EGD) WITH PROPOFOL ;  Surgeon: Therisa Bi, MD;  Location: [facility] ENDOSCOPY;  Service: Endoscopy;  Laterality: N/A;   ESOPHAGOGASTRODUODENOSCOPY (EGD) WITH PROPOFOL  N/A 02/13/2020   Procedure: ESOPHAGOGASTRODUODENOSCOPY (EGD) WITH PROPOFOL ;  Surgeon: Janalyn Keene NOVAK, MD;  Location: [facility] ENDOSCOPY;  Service: Endoscopy;  Laterality: N/A;   INTERCOSTAL NERVE BLOCK  05/09/2024   Procedure: BLOCK, NERVE, INTERCOSTAL;  Surgeon: Kerrin Elspeth BROCKS, MD;  Location: [facility] OR;  Service: Thoracic;;   KNEE ARTHROSCOPY WITH MEDIAL MENISECTOMY Right 09/05/2017   Procedure: KNEE ARTHROSCOPY WITH MEDIAL AND LATERAL  MENISECTOMY PARTIAL SYNOVECTOMY;  Surgeon: Kathlynn Sharper, MD;  Location: [facility] ORS;  Service: Orthopedics;  Laterality: Right;   LEFT HEART CATH AND CORONARY ANGIOGRAPHY N/A 12/04/2019   Procedure: LEFT HEART CATH AND CORONARY ANGIOGRAPHY;  Surgeon: Wonda Sharper, MD;  Location: [facility] INVASIVE CV LAB;  Service: Cardiovascular;  Laterality: N/A;   LEFT HEART CATH AND CORONARY ANGIOGRAPHY Left 11/25/2022   Procedure: LEFT HEART CATH AND CORONARY ANGIOGRAPHY;  Surgeon: Bathe Denyse DELENA, MD;  Location: [facility] INVASIVE CV LAB;  Service: Cardiovascular;  Laterality: Left;   LOBECTOMY, LUNG, ROBOT-ASSISTED, USING VATS Right 05/09/2024   Procedure: LOBECTOMY, LUNG RIGHT UPPER LOBE,  ROBOT-ASSISTED, USING VATS;  Surgeon: Kerrin Elspeth BROCKS, MD;  Location: [facility] OR;  Service: Thoracic;  Laterality: Right;   LYMPH NODE BIOPSY  05/09/2024   Procedure: LYMPH NODE BIOPSY;  Surgeon: Kerrin Elspeth BROCKS, MD;  Location: [facility] OR;  Service: Thoracic;;   REVERSE SHOULDER ARTHROPLASTY Right 08/31/2023   Procedure: RIGHT REVERSE SHOULDER ARTHROPLASTY;  Surgeon: [name], MD;  Location: [facility] OR;  Service: Orthopedics;  Laterality: Right;   SHOULDER SURGERY Right 04/09/2012   SPINE SURGERY     TOTAL KNEE ARTHROPLASTY Right 01/19/2021   Procedure: TOTAL KNEE ARTHROPLASTY - [place] Amber to Assist;  Surgeon: Kathlynn Sharper, MD;  Location: [facility]  ORS;  Service: Orthopedics;  Laterality: Right;   WEDGE RESECTION, LUNG, ROBOT-ASSISTED, THORACOSCOPIC Right 05/09/2024   Procedure: WEDGE RESECTION, LUNG RIGHT UPPER LOBE, ROBOT-ASSISTED, THORACOSCOPIC;  Surgeon: Kerrin Elspeth BROCKS, MD;  Location: [facility] OR;  Service: Thoracic;  Laterality: Right;     Social History:   reports that he quit smoking about 25 years ago. His smoking use included cigarettes. He started smoking about 45 years ago. He has a 63.9 pack-year smoking history. He has been exposed to tobacco smoke. He has never used smokeless tobacco. He reports that he does not currently use alcohol  after a past usage of about 3.0 standard drinks of alcohol  per week. He reports that he does not currently use drugs after having used the following drugs: Cocaine and Marijuana.   Family History:  His family history includes Depression in his mother; Early death in his father; Heart attack in his father; Heart disease in his father and mother; Hypertension in his father, mother, and sister; Osteoarthritis in his mother; Parkinson's disease in his maternal grandfather. There is no history of Prostate cancer, Bladder Cancer, Kidney cancer, or Tremor.   Allergies Allergies  Allergen Reactions   Asenapine Other (See Comments) and Nausea And Vomiting     Increased tremors   Dextromethorphan Hbr    Guaifenesin     Latuda [Lurasidone Hcl] Other (See Comments)    Tremors     Lurasidone Other (See Comments) and Hives    Other Reaction(s): Angioedema   Phenylephrine       Home Medications  Prior to Admission medications   Medication Sig Start Date End Date Taking? Authorizing Provider  acetaminophen  (TYLENOL ) 325 MG tablet Take 1 tablet (325 mg total) by mouth every 6 (six) hours as needed for mild pain (pain score 1-3) (or temp > 100.5). 09/01/23  Yes [name], MD  albuterol  (VENTOLIN  HFA) 108 [phone] Base) MCG/ACT inhaler Inhale 2 puffs into the lungs every 6 (six) hours as needed for wheezing or shortness of breath. 12/19/23  Yes Lorren Greig PARAS, NP  aspirin  EC 81 MG tablet Take 1 tablet (81 mg total) by mouth daily. Swallow whole. 02/03/23  Yes Evelena, Nadir, MD  atorvastatin  (LIPITOR ) 80 MG tablet TAKE 1 TABLET BY MOUTH EVERY DAY 01/15/24  Yes [name], MD  budesonide -glycopyrrolate -formoterol  (BREZTRI  AEROSPHERE) 160-9-4.8 MCG/ACT AERO inhaler Inhale 2 puffs into the lungs in the morning and at bedtime. 12/19/23  Yes Lorren, Amy J, NP  celecoxib  (CELEBREX ) 100 MG capsule TAKE 2 CAPSULES BY MOUTH TWICE A DAY 04/25/24  Yes Georgina Ozell LABOR, MD  divalproex  (DEPAKOTE ) 500 MG DR tablet Take half tablet in the morning and 3 tablets at bedtime Patient taking differently: Take half tablet in the morning, 1 in the afternoon, and 2 tablets at bedtime 04/25/24  Yes Izella Caraway B, MD  DULoxetine  (CYMBALTA ) 30 MG capsule Take 3 capsules (90 mg total) by mouth daily. Patient taking differently: Take 30 mg by mouth 3 (three) times daily. 04/25/24 06/24/24 Yes Izella Caraway NOVAK, MD  gabapentin  (NEURONTIN ) 600 MG tablet Take 1 tablet (600 mg total) by mouth daily AND 2 tablets (1,200 mg total) at bedtime. 04/25/24  Yes Izella Caraway NOVAK, MD  hydrOXYzine  (ATARAX ) 25 MG tablet Take 1 tablet (25 mg total) by mouth 2 (two) times daily as  needed. Patient taking differently: Take 25 mg by mouth daily. 04/25/24  Yes Izella Caraway NOVAK, MD  isosorbide  mononitrate (IMDUR ) 30 MG 24 hr tablet TAKE 1 TABLET BY MOUTH 2 TIMES  DAILY. 04/09/24  Yes Fernand Denyse LABOR, MD  Multiple Vitamins-Minerals (MENS 50+ MULTIVITAMIN) TABS Take 1 tablet by mouth in the morning.   Yes [provider]  nicotine  polacrilex (NICOTINE  MINI) 2 MG lozenge Take 1 lozenge (2 mg total) by mouth every 2 (two) hours as needed for smoking cessation. 03/26/24 06/24/24 Yes Dgayli, Belva, MD  nitroGLYCERIN  (NITROSTAT ) 0.4 MG SL tablet Place 1 tablet (0.4 mg total) under the tongue every 5 (five) minutes as needed for chest pain. 02/03/23 05/06/24 Yes Evelena Figures, MD  pantoprazole  (PROTONIX ) 40 MG tablet Take 1 tablet (40 mg total) by mouth daily. 12/19/23  Yes Lorren, Amy J, NP  QUEtiapine  (SEROQUEL ) 200 MG tablet Take 1 tablet (200 mg total) by mouth at bedtime. 04/25/24  Yes Izella Ismael NOVAK, MD  traZODone  (DESYREL ) 100 MG tablet Take 0.5 tablets (50 mg total) by mouth at bedtime for 7 days. Patient not taking: Reported on 05/06/2024 04/25/24 05/02/24  Izella Ismael NOVAK, MD             My critical care time: 35 minutes   05/21/2024, 2:41 PM  Tamela Stakes, MD  Attending Physician, Critical Care Medicine [facility] See Amion for pager If no response to pager, please call [phone] until 7pm After 7pm, Please call E-link [phone]

## 2024-05-21 NOTE — Progress Notes (Signed)
 Brief Nutrition Follow-up:  Pt remains on vent support. Propofol  and fentanyl  gtt for sedation Vasopressin  OFF.  Tolerating trickle TF.  Per Dr. Emery, ok to begin titration to TF towards goal rate. TF orders adjusted.   Remains constipated; last BM 10/22. If continues without BM despite titration of TF towards goal, recommend increasing/adjusting bowel regimen  Labs reviewed: CBGs in acceptable range (140-180).  Electrolytes wdl except magnesium  3.3.   Interventions:   Tube Feeding via OG: Increase Vital 1.5 to 30 ml/hr, titrate by 10 mL q 8 hours until goal rate of 60 ml/hr Pro-source TF20 60 mL BID Goal TF provides 137 g of protein, 2320 kcals, 1094 mL of free water  Receiving additional lipid calories via propofol  infusion    Betsey Finger MS, RDN, LDN, CNSC Registered Dietitian 3 Clinical Nutrition RD Inpatient Contact Info in Amion

## 2024-05-21 NOTE — Progress Notes (Signed)
 12 Days Post-Op Procedure(s) (LRB): WEDGE RESECTION, LUNG RIGHT UPPER LOBE, ROBOT-ASSISTED, THORACOSCOPIC (Right) LOBECTOMY, LUNG RIGHT UPPER LOBE, ROBOT-ASSISTED, USING VATS (Right) LYMPH NODE BIOPSY BLOCK, NERVE, INTERCOSTAL Subjective: Intubated and sedated  Objective: Vital signs in last 24 hours: Temp:  [99.3 F (37.4 C)-100.9 F (38.3 C)] 100.2 F (37.9 C) (10/28 0700) Pulse Rate:  [72-90] 88 (10/28 0700) Cardiac Rhythm: Normal sinus rhythm (10/27 2000) Resp:  [10-33] 10 (10/28 0700) BP: (89-157)/(55-87) 138/75 (10/28 0645) SpO2:  [87 %-100 %] 93 % (10/28 0700) Arterial Line BP: (94-138)/(46-71) 102/58 (10/28 0700) FiO2 (%):  [45 %-70 %] 70 % (10/28 0613) Weight:  [111.5 kg] 111.5 kg (10/28 0359)  Hemodynamic parameters for last 24 hours: CVP:  [0 mmHg-24 mmHg] 11 mmHg  Intake/Output from previous day: 10/27 0701 - 10/28 0700 In: 2528.5 [I.V.:1077.5; NG/GT:436; IV Piggyback:1015] Out: 2895 [Urine:2270; Emesis/NG output:525; Chest Tube:100] Intake/Output this shift: Total I/O In: 30 [I.V.:20; NG/GT:10] Out: 90 [Urine:90]  General appearance: intubated Neurologic: sedated Heart: regular rate and rhythm Lungs: coarse BS bilaterally Abdomen: soft + air leak  Lab Results: Recent Labs    05/20/24 0500 05/20/24 1019 05/21/24 0421  WBC 16.2*  --  16.2*  HGB 11.1* 10.5* 10.3*  HCT 32.8* 31.0* 30.2*  PLT 148*  --  177   BMET:  Recent Labs    05/20/24 0500 05/20/24 1019 05/21/24 0421  NA 135 136 139  K 4.6 4.6 4.7  CL 94*  --  100  CO2 25  --  29  GLUCOSE 185*  --  161*  BUN 47*  --  48*  CREATININE 1.85*  --  1.12  CALCIUM  7.6*  --  7.6*    PT/INR: No results for input(s): LABPROT, INR in the last 72 hours. ABG    Component Value Date/Time   PHART 7.380 05/20/2024 1019   HCO3 29.2 (H) 05/20/2024 1019   TCO2 31 05/20/2024 1019   ACIDBASEDEF 1.0 05/19/2024 1154   O2SAT 95 05/20/2024 1019   CBG (last 3)  Recent Labs    05/20/24 1948  05/20/24 2325 05/21/24 0351  GLUCAP 160* 173* 149*    Assessment/Plan: S/P Procedure(s) (LRB): WEDGE RESECTION, LUNG RIGHT UPPER LOBE, ROBOT-ASSISTED, THORACOSCOPIC (Right) LOBECTOMY, LUNG RIGHT UPPER LOBE, ROBOT-ASSISTED, USING VATS (Right) LYMPH NODE BIOPSY BLOCK, NERVE, INTERCOSTAL Remains critically ill ID- Sepsis- secondary to hospital acquired pneumonia  WBC elevated but stable  Temps down from yesterday but still low grade fever  Off pressors  CXR stable to slightly improved but had to go up to 70% last night  On Vanco and Maxipime RESP_ VDRF,   Vent per CCM CV- in SR, off vasopressors currently RENAL- creatinine back to normal, lytes OK GI- started on trickle feeds yesterday DVT prophylaxis- on heparin  drip.  Thrombus in gastrocnemius vein on right  LOS: 12 days    [name] 05/21/2024

## 2024-05-21 NOTE — Progress Notes (Signed)
 Pharmacy Antibiotic Note  Keith Gibbs is a 63 y.o. male admitted on 05/09/2024 with pneumonia.  Patient with worsening respiratory status. Pharmacy has been consulted for vancomycin  dosing. Patient started on Zosyn per MD on 05/17/24.   05/18/24: Scr 1.33, elevated from baseline on 0.96 yesterday. WBC increased to 14.5. Tmax 98.14F.  Started on vancomycin  2250 mg load x1, then 2000 mg q24h (SCr 1.33, eAUC 526).     05/21/24: AKI improved (SCr 1.85 > 1.12), 2.2L uop yesterday.  Merrem transitioned to cefepime 10/27.   Plan: Adjust vancomycin  to 1500 mg q12h  - eAUC 530, SCr 1.1 Adjust Cefepime to 2g IV q8h Monitor vancomycin  levels at steady state Monitor renal function, clinical picture F/u LOT and de-escalate as appropriate  Height: 6' 3 (190.5 cm) Weight: 111.5 kg (245 lb 13 oz) IBW/kg (Calculated) : 84.5  Temp (24hrs), Avg:99.8 F (37.7 C), Min:99.3 F (37.4 C), Max:100.4 F (38 C)  Recent Labs  Lab 05/17/24 0417 05/18/24 0244 05/19/24 0232 05/20/24 0500 05/20/24 0839 05/20/24 1140 05/21/24 0421  WBC 10.5 14.5* 14.5* 16.2*  --   --  16.2*  CREATININE 0.96 1.33* 1.22 1.85*  --   --  1.12  LATICACIDVEN  --   --   --   --  2.2* 2.0*  --     Estimated Creatinine Clearance: 91 mL/min (by C-G formula based on SCr of 1.12 mg/dL).    Allergies  Allergen Reactions   Asenapine Other (See Comments) and Nausea And Vomiting    Increased tremors   Dextromethorphan Hbr    Guaifenesin     Latuda [Lurasidone Hcl] Other (See Comments)    Tremors     Lurasidone Other (See Comments) and Hives    Other Reaction(s): Angioedema   Phenylephrine      Antimicrobials this admission: Zosyn 10/24 >> 10/26 Merrem 10/26 > 10/27 Vancomycin  10/25 > c CFP 10/26 x1, 10/28 > c  Microbiology results: 10/21 MRSA PCR: negative 10/26 BCx: ngtd  Thank you for allowing pharmacy to be a part of this patient's care.  Maurilio Fila, PharmD Clinical Pharmacist 05/21/2024  1:09 PM

## 2024-05-21 NOTE — Plan of Care (Signed)
  Problem: Education: Goal: Knowledge of General Education information will improve Description: Including pain rating scale, medication(s)/side effects and non-pharmacologic comfort measures Outcome: Progressing   Problem: Health Behavior/Discharge Planning: Goal: Ability to manage health-related needs will improve Outcome: Progressing   Problem: Clinical Measurements: Goal: Ability to maintain clinical measurements within normal limits will improve Outcome: Progressing Goal: Will remain free from infection Outcome: Progressing Goal: Diagnostic test results will improve Outcome: Progressing Goal: Respiratory complications will improve Outcome: Progressing Goal: Cardiovascular complication will be avoided Outcome: Progressing   Problem: Activity: Goal: Risk for activity intolerance will decrease Outcome: Progressing   Problem: Nutrition: Goal: Adequate nutrition will be maintained Outcome: Progressing   Problem: Coping: Goal: Level of anxiety will decrease Outcome: Progressing   Problem: Elimination: Goal: Will not experience complications related to bowel motility Outcome: Progressing Goal: Will not experience complications related to urinary retention Outcome: Progressing   Problem: Pain Managment: Goal: General experience of comfort will improve and/or be controlled Outcome: Progressing   Problem: Safety: Goal: Ability to remain free from injury will improve Outcome: Progressing   Problem: Skin Integrity: Goal: Risk for impaired skin integrity will decrease Outcome: Progressing   Problem: Education: Goal: Knowledge of disease or condition will improve Outcome: Progressing Goal: Knowledge of the prescribed therapeutic regimen will improve Outcome: Progressing   Problem: Activity: Goal: Risk for activity intolerance will decrease Outcome: Progressing   Problem: Cardiac: Goal: Will achieve and/or maintain hemodynamic stability Outcome: Progressing    Problem: Clinical Measurements: Goal: Postoperative complications will be avoided or minimized Outcome: Progressing   Problem: Respiratory: Goal: Respiratory status will improve Outcome: Progressing   Problem: Pain Management: Goal: Pain level will decrease Outcome: Progressing   Problem: Skin Integrity: Goal: Wound healing without signs and symptoms infection will improve Outcome: Progressing   Problem: Education: Goal: Ability to describe self-care measures that may prevent or decrease complications (Diabetes Survival Skills Education) will improve Outcome: Progressing Goal: Individualized Educational Video(s) Outcome: Progressing   Problem: Coping: Goal: Ability to adjust to condition or change in health will improve Outcome: Progressing   Problem: Fluid Volume: Goal: Ability to maintain a balanced intake and output will improve Outcome: Progressing   Problem: Health Behavior/Discharge Planning: Goal: Ability to identify and utilize available resources and services will improve Outcome: Progressing Goal: Ability to manage health-related needs will improve Outcome: Progressing   Problem: Metabolic: Goal: Ability to maintain appropriate glucose levels will improve Outcome: Progressing   Problem: Nutritional: Goal: Maintenance of adequate nutrition will improve Outcome: Progressing Goal: Progress toward achieving an optimal weight will improve Outcome: Progressing   Problem: Skin Integrity: Goal: Risk for impaired skin integrity will decrease Outcome: Progressing   Problem: Tissue Perfusion: Goal: Adequacy of tissue perfusion will improve Outcome: Progressing

## 2024-05-22 ENCOUNTER — Encounter (HOSPITAL_COMMUNITY)
Admission: RE | Disposition: E | Payer: Self-pay | Source: Home / Self Care | Attending: Thoracic Surgery (Cardiothoracic Vascular Surgery)

## 2024-05-22 ENCOUNTER — Ambulatory Visit (HOSPITAL_COMMUNITY): Payer: MEDICAID

## 2024-05-22 ENCOUNTER — Inpatient Hospital Stay (HOSPITAL_COMMUNITY): Payer: MEDICAID

## 2024-05-22 DIAGNOSIS — J9621 Acute and chronic respiratory failure with hypoxia: Secondary | ICD-10-CM

## 2024-05-22 DIAGNOSIS — U071 COVID-19: Secondary | ICD-10-CM

## 2024-05-22 DIAGNOSIS — A419 Sepsis, unspecified organism: Secondary | ICD-10-CM | POA: Diagnosis not present

## 2024-05-22 DIAGNOSIS — Z902 Acquired absence of lung [part of]: Secondary | ICD-10-CM | POA: Diagnosis not present

## 2024-05-22 DIAGNOSIS — J9622 Acute and chronic respiratory failure with hypercapnia: Secondary | ICD-10-CM

## 2024-05-22 DIAGNOSIS — J441 Chronic obstructive pulmonary disease with (acute) exacerbation: Secondary | ICD-10-CM | POA: Diagnosis not present

## 2024-05-22 DIAGNOSIS — J80 Acute respiratory distress syndrome: Secondary | ICD-10-CM | POA: Diagnosis not present

## 2024-05-22 DIAGNOSIS — J939 Pneumothorax, unspecified: Secondary | ICD-10-CM | POA: Diagnosis not present

## 2024-05-22 HISTORY — PX: ECMO CANNULATION: CATH118321

## 2024-05-22 LAB — POCT I-STAT 7, (LYTES, BLD GAS, ICA,H+H)
Acid-Base Excess: 6 mmol/L — ABNORMAL HIGH (ref 0.0–2.0)
Acid-Base Excess: 7 mmol/L — ABNORMAL HIGH (ref 0.0–2.0)
Acid-Base Excess: 5 mmol/L — ABNORMAL HIGH (ref 0.0–2.0)
Acid-Base Excess: 4 mmol/L — ABNORMAL HIGH (ref 0.0–2.0)
Acid-Base Excess: 2 mmol/L (ref 0.0–2.0)
Acid-Base Excess: 2 mmol/L (ref 0.0–2.0)
Acid-Base Excess: 4 mmol/L — ABNORMAL HIGH (ref 0.0–2.0)
Acid-Base Excess: 6 mmol/L — ABNORMAL HIGH (ref 0.0–2.0)
Acid-Base Excess: 6 mmol/L — ABNORMAL HIGH (ref 0.0–2.0)
Acid-Base Excess: 5 mmol/L — ABNORMAL HIGH (ref 0.0–2.0)
Bicarbonate: 32.6 mmol/L — ABNORMAL HIGH (ref 20.0–28.0)
Bicarbonate: 33.4 mmol/L — ABNORMAL HIGH (ref 20.0–28.0)
Bicarbonate: 32.1 mmol/L — ABNORMAL HIGH (ref 20.0–28.0)
Bicarbonate: 33.7 mmol/L — ABNORMAL HIGH (ref 20.0–28.0)
Bicarbonate: 33.4 mmol/L — ABNORMAL HIGH (ref 20.0–28.0)
Bicarbonate: 31.4 mmol/L — ABNORMAL HIGH (ref 20.0–28.0)
Bicarbonate: 34.6 mmol/L — ABNORMAL HIGH (ref 20.0–28.0)
Bicarbonate: 29.5 mmol/L — ABNORMAL HIGH (ref 20.0–28.0)
Bicarbonate: 30.9 mmol/L — ABNORMAL HIGH (ref 20.0–28.0)
Bicarbonate: 29.9 mmol/L — ABNORMAL HIGH (ref 20.0–28.0)
Calcium, Ion: 1.12 mmol/L — ABNORMAL LOW (ref 1.15–1.40)
Calcium, Ion: 1.13 mmol/L — ABNORMAL LOW (ref 1.15–1.40)
Calcium, Ion: 1.13 mmol/L — ABNORMAL LOW (ref 1.15–1.40)
Calcium, Ion: 1.18 mmol/L (ref 1.15–1.40)
Calcium, Ion: 1.16 mmol/L (ref 1.15–1.40)
Calcium, Ion: 1.08 mmol/L — ABNORMAL LOW (ref 1.15–1.40)
Calcium, Ion: 1.17 mmol/L (ref 1.15–1.40)
Calcium, Ion: 1.07 mmol/L — ABNORMAL LOW (ref 1.15–1.40)
Calcium, Ion: 1.08 mmol/L — ABNORMAL LOW (ref 1.15–1.40)
Calcium, Ion: 1.1 mmol/L — ABNORMAL LOW (ref 1.15–1.40)
HCT: 29 % — ABNORMAL LOW (ref 39.0–52.0)
HCT: 30 % — ABNORMAL LOW (ref 39.0–52.0)
HCT: 31 % — ABNORMAL LOW (ref 39.0–52.0)
HCT: 31 % — ABNORMAL LOW (ref 39.0–52.0)
HCT: 32 % — ABNORMAL LOW (ref 39.0–52.0)
HCT: 27 % — ABNORMAL LOW (ref 39.0–52.0)
HCT: 33 % — ABNORMAL LOW (ref 39.0–52.0)
HCT: 26 % — ABNORMAL LOW (ref 39.0–52.0)
HCT: 27 % — ABNORMAL LOW (ref 39.0–52.0)
HCT: 26 % — ABNORMAL LOW (ref 39.0–52.0)
Hemoglobin: 10.2 g/dL — ABNORMAL LOW (ref 13.0–17.0)
Hemoglobin: 9.9 g/dL — ABNORMAL LOW (ref 13.0–17.0)
Hemoglobin: 10.5 g/dL — ABNORMAL LOW (ref 13.0–17.0)
Hemoglobin: 10.5 g/dL — ABNORMAL LOW (ref 13.0–17.0)
Hemoglobin: 10.9 g/dL — ABNORMAL LOW (ref 13.0–17.0)
Hemoglobin: 11.2 g/dL — ABNORMAL LOW (ref 13.0–17.0)
Hemoglobin: 9.2 g/dL — ABNORMAL LOW (ref 13.0–17.0)
Hemoglobin: 8.8 g/dL — ABNORMAL LOW (ref 13.0–17.0)
Hemoglobin: 9.2 g/dL — ABNORMAL LOW (ref 13.0–17.0)
Hemoglobin: 8.8 g/dL — ABNORMAL LOW (ref 13.0–17.0)
O2 Saturation: 96 %
O2 Saturation: 97 %
O2 Saturation: 95 %
O2 Saturation: 96 %
O2 Saturation: 97 %
O2 Saturation: 100 %
O2 Saturation: 97 %
O2 Saturation: 100 %
O2 Saturation: 100 %
O2 Saturation: 96 %
Patient temperature: 37.9
Patient temperature: 38
Patient temperature: 38.1
Patient temperature: 38.3
Patient temperature: 38
Patient temperature: 38
Patient temperature: 38
Patient temperature: 37
Patient temperature: 38
Potassium: 5 mmol/L (ref 3.5–5.1)
Potassium: 5.1 mmol/L (ref 3.5–5.1)
Potassium: 5 mmol/L (ref 3.5–5.1)
Potassium: 5.1 mmol/L (ref 3.5–5.1)
Potassium: 5.5 mmol/L — ABNORMAL HIGH (ref 3.5–5.1)
Potassium: 5.8 mmol/L — ABNORMAL HIGH (ref 3.5–5.1)
Potassium: 5.9 mmol/L — ABNORMAL HIGH (ref 3.5–5.1)
Potassium: 5.8 mmol/L — ABNORMAL HIGH (ref 3.5–5.1)
Potassium: 5.8 mmol/L — ABNORMAL HIGH (ref 3.5–5.1)
Potassium: 5.7 mmol/L — ABNORMAL HIGH (ref 3.5–5.1)
Sodium: 143 mmol/L (ref 135–145)
Sodium: 143 mmol/L (ref 135–145)
Sodium: 143 mmol/L (ref 135–145)
Sodium: 143 mmol/L (ref 135–145)
Sodium: 144 mmol/L (ref 135–145)
Sodium: 143 mmol/L (ref 135–145)
Sodium: 144 mmol/L (ref 135–145)
Sodium: 142 mmol/L (ref 135–145)
Sodium: 142 mmol/L (ref 135–145)
Sodium: 142 mmol/L (ref 135–145)
TCO2: 34 mmol/L — ABNORMAL HIGH (ref 22–32)
TCO2: 35 mmol/L — ABNORMAL HIGH (ref 22–32)
TCO2: 34 mmol/L — ABNORMAL HIGH (ref 22–32)
TCO2: 36 mmol/L — ABNORMAL HIGH (ref 22–32)
TCO2: 36 mmol/L — ABNORMAL HIGH (ref 22–32)
TCO2: 34 mmol/L — ABNORMAL HIGH (ref 22–32)
TCO2: 38 mmol/L — ABNORMAL HIGH (ref 22–32)
TCO2: 31 mmol/L (ref 22–32)
TCO2: 32 mmol/L (ref 22–32)
TCO2: 31 mmol/L (ref 22–32)
pCO2 arterial: 58 mmHg — ABNORMAL HIGH (ref 32–48)
pCO2 arterial: 58.8 mmHg — ABNORMAL HIGH (ref 32–48)
pCO2 arterial: 61 mmHg — ABNORMAL HIGH (ref 32–48)
pCO2 arterial: 90.4 mmHg (ref 32–48)
pCO2 arterial: 99.5 mmHg (ref 32–48)
pCO2 arterial: 100.8 mmHg (ref 32–48)
pCO2 arterial: 85.2 mmHg (ref 32–48)
pCO2 arterial: 38 mmHg (ref 32–48)
pCO2 arterial: 50.9 mmHg — ABNORMAL HIGH (ref 32–48)
pCO2 arterial: 44 mmHg (ref 32–48)
pH, Arterial: 7.362 (ref 7.35–7.45)
pH, Arterial: 7.366 (ref 7.35–7.45)
pH, Arterial: 7.335 — ABNORMAL LOW (ref 7.35–7.45)
pH, Arterial: 7.187 — CL (ref 7.35–7.45)
pH, Arterial: 7.141 — CL (ref 7.35–7.45)
pH, Arterial: 7.15 — CL (ref 7.35–7.45)
pH, Arterial: 7.181 — CL (ref 7.35–7.45)
pH, Arterial: 7.396 (ref 7.35–7.45)
pH, Arterial: 7.498 — ABNORMAL HIGH (ref 7.35–7.45)
pH, Arterial: 7.44 (ref 7.35–7.45)
pO2, Arterial: 93 mmHg (ref 83–108)
pO2, Arterial: 97 mmHg (ref 83–108)
pO2, Arterial: 90 mmHg (ref 83–108)
pO2, Arterial: 112 mmHg — ABNORMAL HIGH (ref 83–108)
pO2, Arterial: 135 mmHg — ABNORMAL HIGH (ref 83–108)
pO2, Arterial: 125 mmHg — ABNORMAL HIGH (ref 83–108)
pO2, Arterial: 257 mmHg — ABNORMAL HIGH (ref 83–108)
pO2, Arterial: 236 mmHg — ABNORMAL HIGH (ref 83–108)
pO2, Arterial: 308 mmHg — ABNORMAL HIGH (ref 83–108)
pO2, Arterial: 80 mmHg — ABNORMAL LOW (ref 83–108)

## 2024-05-22 LAB — BASIC METABOLIC PANEL WITH GFR
Anion gap: 14 (ref 5–15)
Anion gap: 10 (ref 5–15)
BUN: 51 mg/dL — ABNORMAL HIGH (ref 8–23)
BUN: 51 mg/dL — ABNORMAL HIGH (ref 8–23)
CO2: 29 mmol/L (ref 22–32)
CO2: 29 mmol/L (ref 22–32)
Calcium: 7.9 mg/dL — ABNORMAL LOW (ref 8.9–10.3)
Calcium: 7.5 mg/dL — ABNORMAL LOW (ref 8.9–10.3)
Chloride: 100 mmol/L (ref 98–111)
Chloride: 104 mmol/L (ref 98–111)
Creatinine, Ser: 0.98 mg/dL (ref 0.61–1.24)
Creatinine, Ser: 1.11 mg/dL (ref 0.61–1.24)
GFR, Estimated: >60 mL/min (ref >60)
GFR, Estimated: >60 mL/min
Glucose, Bld: 134 mg/dL — ABNORMAL HIGH (ref 70–99)
Glucose, Bld: 200 mg/dL — ABNORMAL HIGH (ref 70–99)
Potassium: 4.8 mmol/L (ref 3.5–5.1)
Potassium: 5.3 mmol/L — ABNORMAL HIGH (ref 3.5–5.1)
Sodium: 143 mmol/L (ref 135–145)
Sodium: 143 mmol/L (ref 135–145)

## 2024-05-22 LAB — CBC
HCT: 30.8 % — ABNORMAL LOW (ref 39.0–52.0)
HCT: 29.5 % — ABNORMAL LOW (ref 39.0–52.0)
Hemoglobin: 9.4 g/dL — ABNORMAL LOW (ref 13.0–17.0)
Hemoglobin: 9.4 g/dL — ABNORMAL LOW (ref 13.0–17.0)
MCH: 32.2 pg (ref 26.0–34.0)
MCH: 32.6 pg (ref 26.0–34.0)
MCHC: 30.5 g/dL (ref 30.0–36.0)
MCHC: 31.9 g/dL (ref 30.0–36.0)
MCV: 105.5 fL — ABNORMAL HIGH (ref 80.0–100.0)
MCV: 102.4 fL — ABNORMAL HIGH (ref 80.0–100.0)
Platelets: 201 K/uL (ref 150–400)
Platelets: 243 K/uL (ref 150–400)
RBC: 2.92 MIL/uL — ABNORMAL LOW (ref 4.22–5.81)
RBC: 2.88 MIL/uL — ABNORMAL LOW (ref 4.22–5.81)
RDW: 14.9 % (ref 11.5–15.5)
RDW: 14.9 % (ref 11.5–15.5)
WBC: 22.9 K/uL — ABNORMAL HIGH (ref 4.0–10.5)
WBC: 23.7 K/uL — ABNORMAL HIGH (ref 4.0–10.5)
nRBC: 0.3 % — ABNORMAL HIGH (ref 0.0–0.2)
nRBC: 0.3 % — ABNORMAL HIGH (ref 0.0–0.2)

## 2024-05-22 LAB — APTT
aPTT: >200 s (ref 24–36)
aPTT: 65 s — ABNORMAL HIGH (ref 24–36)

## 2024-05-22 LAB — BODY FLUID CELL COUNT WITH DIFFERENTIAL
Eos, Fluid: 0 %
Lymphs, Fluid: 0 %
Monocyte-Macrophage-Serous Fluid: 0 % — ABNORMAL LOW (ref 50–90)
Neutrophil Count, Fluid: 100 % — ABNORMAL HIGH (ref 0–25)
Total Nucleated Cell Count, Fluid: 2450 uL — ABNORMAL HIGH (ref 0–1000)

## 2024-05-22 LAB — MAGNESIUM: Magnesium: 3.1 mg/dL — ABNORMAL HIGH (ref 1.7–2.4)

## 2024-05-22 LAB — FIBRINOGEN: Fibrinogen: 490 mg/dL — ABNORMAL HIGH (ref 210–475)

## 2024-05-22 LAB — GLUCOSE, CAPILLARY
Glucose-Capillary: 132 mg/dL — ABNORMAL HIGH (ref 70–99)
Glucose-Capillary: 110 mg/dL — ABNORMAL HIGH (ref 70–99)
Glucose-Capillary: 113 mg/dL — ABNORMAL HIGH (ref 70–99)
Glucose-Capillary: 182 mg/dL — ABNORMAL HIGH (ref 70–99)
Glucose-Capillary: 184 mg/dL — ABNORMAL HIGH (ref 70–99)
Glucose-Capillary: 180 mg/dL — ABNORMAL HIGH (ref 70–99)

## 2024-05-22 LAB — GLOBAL TEG PANEL
CFF Max Amplitude: 16.1 mm (ref 15–32)
CFF Max Amplitude: 31.7 mm (ref 15–32)
CK with Heparinase (R): 7.2 min (ref 4.3–8.3)
CK with Heparinase (R): 7.4 min (ref 4.3–8.3)
Citrated Functional Fibrinogen: 293.8 mg/dL (ref 278–581)
Citrated Functional Fibrinogen: 578.5 mg/dL (ref 278–581)
Citrated Rapid TEG (MA): 67.5 mm (ref 52–70)
Citrated Rapid TEG (MA): 68.7 mm (ref 52–70)

## 2024-05-22 LAB — CBC WITH DIFFERENTIAL/PLATELET
Abs Immature Granulocytes: 1.96 K/uL — ABNORMAL HIGH (ref 0.00–0.07)
Basophils Absolute: 0.2 K/uL — ABNORMAL HIGH (ref 0.0–0.1)
Basophils Relative: 1 %
Eosinophils Absolute: 0 K/uL (ref 0.0–0.5)
Eosinophils Relative: 0 %
HCT: 32 % — ABNORMAL LOW (ref 39.0–52.0)
Hemoglobin: 10.4 g/dL — ABNORMAL LOW (ref 13.0–17.0)
Immature Granulocytes: 10 %
Lymphocytes Relative: 10 %
Lymphs Abs: 1.9 K/uL (ref 0.7–4.0)
MCH: 32.7 pg (ref 26.0–34.0)
MCHC: 32.5 g/dL (ref 30.0–36.0)
MCV: 100.6 fL — ABNORMAL HIGH (ref 80.0–100.0)
Monocytes Absolute: 2.2 K/uL — ABNORMAL HIGH (ref 0.1–1.0)
Monocytes Relative: 11 %
Neutro Abs: 13.5 K/uL — ABNORMAL HIGH (ref 1.7–7.7)
Neutrophils Relative %: 68 %
Platelets: 215 K/uL (ref 150–400)
RBC: 3.18 MIL/uL — ABNORMAL LOW (ref 4.22–5.81)
RDW: 14.8 % (ref 11.5–15.5)
Smear Review: NORMAL
WBC: 19.7 K/uL — ABNORMAL HIGH (ref 4.0–10.5)
nRBC: 0.2 % (ref 0.0–0.2)

## 2024-05-22 LAB — HEPATIC FUNCTION PANEL
ALT: 25 U/L (ref 0–44)
ALT: 28 U/L (ref 0–44)
AST: 42 U/L — ABNORMAL HIGH (ref 15–41)
AST: 54 U/L — ABNORMAL HIGH (ref 15–41)
Albumin: 1.9 g/dL — ABNORMAL LOW (ref 3.5–5.0)
Albumin: 1.6 g/dL — ABNORMAL LOW (ref 3.5–5.0)
Alkaline Phosphatase: 92 U/L (ref 38–126)
Alkaline Phosphatase: 79 U/L (ref 38–126)
Bilirubin, Direct: 0.1 mg/dL (ref 0.0–0.2)
Bilirubin, Direct: 0.3 mg/dL — ABNORMAL HIGH (ref 0.0–0.2)
Indirect Bilirubin: 0.3 mg/dL (ref 0.3–0.9)
Indirect Bilirubin: 0.3 mg/dL (ref 0.3–0.9)
Total Bilirubin: 0.4 mg/dL (ref 0.0–1.2)
Total Bilirubin: 0.6 mg/dL (ref 0.0–1.2)
Total Protein: 5.9 g/dL — ABNORMAL LOW (ref 6.5–8.1)
Total Protein: 5.1 g/dL — ABNORMAL LOW (ref 6.5–8.1)

## 2024-05-22 LAB — PROTIME-INR
INR: 1.3 — ABNORMAL HIGH (ref 0.8–1.2)
Prothrombin Time: 16.8 s — ABNORMAL HIGH (ref 11.4–15.2)

## 2024-05-22 LAB — POCT ACTIVATED CLOTTING TIME
Activated Clotting Time: 135 s
Activated Clotting Time: 152 s
Activated Clotting Time: 210 s
Activated Clotting Time: 210 s

## 2024-05-22 LAB — CG4 I-STAT (LACTIC ACID)
Lactic Acid, Venous: 1 mmol/L (ref 0.5–1.9)
Lactic Acid, Venous: 1.6 mmol/L (ref 0.5–1.9)

## 2024-05-22 LAB — LACTATE DEHYDROGENASE: LDH: 544 U/L — ABNORMAL HIGH (ref 98–192)

## 2024-05-22 LAB — HEPARIN LEVEL (UNFRACTIONATED)
Heparin Unfractionated: 0.25 [IU]/mL — ABNORMAL LOW (ref 0.30–0.70)
Heparin Unfractionated: >1.1 [IU]/mL — ABNORMAL HIGH (ref 0.30–0.70)
Heparin Unfractionated: >1.1 [IU]/mL — ABNORMAL HIGH (ref 0.30–0.70)

## 2024-05-22 LAB — PREPARE RBC (CROSSMATCH)

## 2024-05-22 LAB — PHOSPHORUS: Phosphorus: 3.2 mg/dL (ref 2.5–4.6)

## 2024-05-22 SURGERY — ECMO CANNULATION
Anesthesia: LOCAL

## 2024-05-22 MED ORDER — MIDAZOLAM BOLUS VIA INFUSION
0.0000 mg | INTRAVENOUS | Status: DC | PRN
  Administered 2024-05-23 (×2): 5 mg via INTRAVENOUS

## 2024-05-22 MED ORDER — SODIUM CHLORIDE 0.9 % IV SOLN
1.0000 g | Freq: Three times a day (TID) | INTRAVENOUS | Status: AC
  Administered 2024-05-22 – 2024-05-30 (×25): 1 g via INTRAVENOUS
  Filled 2024-05-22 (×25): qty 20

## 2024-05-22 MED ORDER — HEPARIN SODIUM (PORCINE) 1000 UNIT/ML IJ SOLN
INTRAMUSCULAR | Status: DC | PRN
  Administered 2024-05-22 (×2): 5000 [IU] via INTRAVENOUS
  Administered 2024-05-22: 2000 [IU] via INTRAVENOUS

## 2024-05-22 MED ORDER — INSULIN ASPART 100 UNIT/ML IJ SOLN
0.0000 [IU] | INTRAMUSCULAR | Status: DC
  Administered 2024-05-22 (×2): 3 [IU] via SUBCUTANEOUS
  Administered 2024-05-23 – 2024-05-24 (×2): 2 [IU] via SUBCUTANEOUS
  Administered 2024-05-24: 3 [IU] via SUBCUTANEOUS
  Administered 2024-05-24 – 2024-05-25 (×6): 2 [IU] via SUBCUTANEOUS
  Administered 2024-05-25: 3 [IU] via SUBCUTANEOUS
  Administered 2024-05-26 – 2024-05-30 (×15): 2 [IU] via SUBCUTANEOUS
  Administered 2024-05-31: 3 [IU] via SUBCUTANEOUS
  Administered 2024-05-31 (×4): 2 [IU] via SUBCUTANEOUS
  Administered 2024-06-01 (×3): 3 [IU] via SUBCUTANEOUS
  Administered 2024-06-01 (×3): 2 [IU] via SUBCUTANEOUS
  Administered 2024-06-02 (×2): 5 [IU] via SUBCUTANEOUS
  Administered 2024-06-02 – 2024-06-04 (×11): 3 [IU] via SUBCUTANEOUS
  Administered 2024-06-04 (×2): 2 [IU] via SUBCUTANEOUS
  Filled 2024-05-22 (×4): qty 3
  Filled 2024-05-22 (×2): qty 2
  Filled 2024-05-22 (×2): qty 3
  Filled 2024-05-22 (×2): qty 2
  Filled 2024-05-22: qty 3
  Filled 2024-05-22 (×2): qty 2
  Filled 2024-05-22: qty 1
  Filled 2024-05-22: qty 2
  Filled 2024-05-22: qty 1
  Filled 2024-05-22 (×2): qty 3
  Filled 2024-05-22: qty 2
  Filled 2024-05-22: qty 3

## 2024-05-22 MED ORDER — ALBUMIN HUMAN 5 % IV SOLN
12.5000 g | INTRAVENOUS | Status: DC | PRN
  Administered 2024-05-24 – 2024-06-04 (×9): 12.5 g via INTRAVENOUS
  Filled 2024-05-22 (×11): qty 250

## 2024-05-22 MED ORDER — ALBUMIN HUMAN 25 % IV SOLN: 12.5000 g | INTRAVENOUS | Status: DC | PRN

## 2024-05-22 MED ORDER — LACTATED RINGERS IV SOLN: INTRAVENOUS | Status: DC

## 2024-05-22 MED ORDER — MIDAZOLAM HCL (PF) 2 MG/2ML IJ SOLN: 2.0000 mg | Freq: Once | INTRAMUSCULAR | Status: AC

## 2024-05-22 MED ORDER — INSULIN REGULAR(HUMAN) IN NACL 100-0.9 UT/100ML-% IV SOLN
INTRAVENOUS | Status: DC
Start: 2024-05-22 — End: 2024-05-22

## 2024-05-22 MED ORDER — VANCOMYCIN HCL 1500 MG/300ML IV SOLN
1500.0000 mg | Freq: Two times a day (BID) | INTRAVENOUS | Status: DC
  Administered 2024-05-22 – 2024-05-23 (×2): 1500 mg via INTRAVENOUS
  Filled 2024-05-22 (×3): qty 300

## 2024-05-22 MED ORDER — FUROSEMIDE 10 MG/ML IJ SOLN
40.0000 mg | Freq: Every day | INTRAMUSCULAR | Status: DC
  Administered 2024-05-23: 40 mg via INTRAVENOUS
  Filled 2024-05-22 (×2): qty 4

## 2024-05-22 MED ORDER — ALBUMIN HUMAN 5 % IV SOLN
INTRAVENOUS | Status: AC
  Administered 2024-05-22: 12.5 g
  Filled 2024-05-22: qty 500

## 2024-05-22 MED ORDER — FENTANYL BOLUS VIA INFUSION: 50.0000 ug | INTRAVENOUS | Status: DC | PRN

## 2024-05-22 MED ORDER — MIDAZOLAM HCL (PF) 2 MG/2ML IJ SOLN: 1.0000 mg | INTRAMUSCULAR | Status: DC | PRN

## 2024-05-22 MED ORDER — SODIUM CHLORIDE 0.9 % IV SOLN
0.0000 ug/kg/min | INTRAVENOUS | Status: DC
  Administered 2024-05-22: 3 ug/kg/min via INTRAVENOUS
  Filled 2024-05-22 (×2): qty 20

## 2024-05-22 MED ORDER — MIDAZOLAM HCL 2 MG/2ML IJ SOLN
INTRAMUSCULAR | Status: AC
  Administered 2024-05-22: 2 mg via INTRAVENOUS
  Filled 2024-05-22: qty 2

## 2024-05-22 MED ORDER — CISATRACURIUM BOLUS VIA INFUSION
0.1000 mg/kg | Freq: Once | INTRAVENOUS | Status: AC
  Administered 2024-05-22: 11 mg via INTRAVENOUS
  Filled 2024-05-22: qty 11

## 2024-05-22 MED ORDER — PANTOPRAZOLE SODIUM 40 MG IV SOLR
40.0000 mg | Freq: Every day | INTRAVENOUS | Status: DC
  Administered 2024-05-22 – 2024-06-03 (×13): 40 mg via INTRAVENOUS
  Filled 2024-05-22 (×13): qty 10

## 2024-05-22 MED ORDER — DEXTROSE IN LACTATED RINGERS 5 % IV SOLN: INTRAVENOUS | Status: DC

## 2024-05-22 MED ORDER — HEPARIN (PORCINE) IN NACL 1000-0.9 UT/500ML-% IV SOLN
INTRAVENOUS | Status: DC | PRN
  Administered 2024-05-22: 500 mL

## 2024-05-22 MED ORDER — MIDAZOLAM-SODIUM CHLORIDE 100-0.9 MG/100ML-% IV SOLN
INTRAVENOUS | Status: AC
  Filled 2024-05-22: qty 100

## 2024-05-22 MED ORDER — SENNOSIDES-DOCUSATE SODIUM 8.6-50 MG PO TABS
2.0000 | ORAL_TABLET | Freq: Every day | ORAL | Status: DC
  Administered 2024-05-22 – 2024-05-26 (×5): 2
  Filled 2024-05-22 (×5): qty 2

## 2024-05-22 MED ORDER — FENTANYL CITRATE (PF) 50 MCG/ML IJ SOSY: 50.0000 ug | PREFILLED_SYRINGE | Freq: Once | INTRAMUSCULAR | Status: DC

## 2024-05-22 MED ORDER — DEXTROSE 50 % IV SOLN: 0.0000 mL | INTRAVENOUS | Status: DC | PRN

## 2024-05-22 MED ORDER — PROPOFOL 1000 MG/100ML IV EMUL
25.0000 ug/kg/min | INTRAVENOUS | Status: DC
  Administered 2024-05-22: 60 ug/kg/min via INTRAVENOUS
  Administered 2024-05-22: 70 ug/kg/min via INTRAVENOUS
  Filled 2024-05-22 (×3): qty 100

## 2024-05-22 MED ORDER — MIDAZOLAM-SODIUM CHLORIDE 100-0.9 MG/100ML-% IV SOLN
0.0000 mg/h | INTRAVENOUS | Status: DC
  Administered 2024-05-23: 9 mg/h via INTRAVENOUS
  Filled 2024-05-22 (×2): qty 100

## 2024-05-22 MED ORDER — HEPARIN (PORCINE) 25000 UT/250ML-% IV SOLN
1700.0000 [IU]/h | INTRAVENOUS | Status: DC
  Administered 2024-05-22 – 2024-05-23 (×2): 1700 [IU]/h via INTRAVENOUS
  Filled 2024-05-22: qty 250

## 2024-05-22 MED ORDER — SODIUM CHLORIDE 0.9 % IV SOLN
200.0000 mg | INTRAVENOUS | Status: DC
  Administered 2024-05-22 – 2024-06-03 (×13): 200 mg via INTRAVENOUS
  Filled 2024-05-22 (×14): qty 10

## 2024-05-22 MED ORDER — ARTIFICIAL TEARS OPHTHALMIC OINT
1.0000 | TOPICAL_OINTMENT | Freq: Three times a day (TID) | OPHTHALMIC | Status: DC
  Administered 2024-05-22: 1 via OPHTHALMIC
  Filled 2024-05-22: qty 3.5

## 2024-05-22 MED ORDER — SODIUM CHLORIDE 0.9 % IV SOLN: INTRAVENOUS | Status: AC | PRN

## 2024-05-22 MED ORDER — FENTANYL 2500MCG IN NS 250ML (10MCG/ML) PREMIX INFUSION
50.0000 ug/h | INTRAVENOUS | Status: DC
  Administered 2024-05-22 – 2024-05-23 (×2): 250 ug/h via INTRAVENOUS
  Filled 2024-05-22 (×2): qty 250

## 2024-05-22 SURGICAL SUPPLY — 9 items
CATH DUAL LUMEN CRESCENT 32FR (CATHETERS) IMPLANT
KIT DILATOR VASC 18G NDL (KITS) IMPLANT
KIT MICROPUNCTURE NIT STIFF (SHEATH) IMPLANT
KIT VASCULAR ACCESS OPUS (MISCELLANEOUS) IMPLANT
PACK CARDIAC CATHETERIZATION (CUSTOM PROCEDURE TRAY) IMPLANT
SHEATH PINNACLE 7F 10CM (SHEATH) IMPLANT
SHEATH PROBE COVER 6X72 (BAG) IMPLANT
WIRE AMPLATZ SSTIFF .035X260CM (WIRE) IMPLANT
WIRE EMERALD 3MM-J .035X260CM (WIRE) IMPLANT

## 2024-05-22 NOTE — Interval H&P Note (Signed)
 History and Physical Interval Note:  05/22/2024 4:02 PM  Keith Gibbs  has presented today for surgery, with the diagnosis of heart failure.  The various methods of treatment have been discussed with the patient and family. After consideration of risks, benefits and other options for treatment, the patient has consented to  Procedure(s): ECMO CANNULATION (N/A) as a surgical intervention.  The patient's history has been reviewed, patient examined, no change in status, stable for surgery.  I have reviewed the patient's chart and labs.  Questions were answered to the patient's satisfaction.     Zaine Elsass

## 2024-05-22 NOTE — Progress Notes (Signed)
 Pharmacy Antibiotic Note  JIMEL MYLER is a 63 y.o. male s/p RUL lobectomy with pseudomonal PNA on cefepime now on ECMO. Plans to change cefepime to meropenem and to re-start vancomycin   Plan: -Meropenem 1gm IV q8h -Vancomycin  1500 mg IV q12h (Estimated AUC= 477 using SCr 1.0) -Will follow renal function, cultures and clinical progress    Height: 6' 3 (190.5 cm) Weight: 109.5 kg (241 lb 6.5 oz) IBW/kg (Calculated) : 84.5  Temp (24hrs), Avg:100.3 F (37.9 C), Min:99.5 F (37.5 C), Max:101.5 F (38.6 C)  Recent Labs  Lab 05/18/24 0244 05/19/24 0232 05/20/24 0500 05/20/24 0839 05/20/24 1140 05/21/24 0421 05/22/24 0451 05/22/24 1609  WBC 14.5* 14.5* 16.2*  --   --  16.2* 19.7*  --   CREATININE 1.33* 1.22 1.85*  --   --  1.12 0.98  --   LATICACIDVEN  --   --   --  2.2* 2.0*  --   --  1.0    Estimated Creatinine Clearance: 103.1 mL/min (by C-G formula based on SCr of 0.98 mg/dL).    Allergies  Allergen Reactions   Asenapine Other (See Comments) and Nausea And Vomiting    Increased tremors   Dextromethorphan Hbr    Guaifenesin     Latuda [Lurasidone Hcl] Other (See Comments)    Tremors     Lurasidone Other (See Comments) and Hives    Other Reaction(s): Angioedema   Phenylephrine      Antimicrobials this admission: Zosyn 10/24 > 10/26 Vanc 10/26 > 10/29     Merrem 10/26 CFP 10/27 > 10/29 Azithro 10/26>10/27  Dose adjustments this admission:   Microbiology results: 10/21 MRSA: neg 10/26 BCx: ngtd 1/0/26 BAL: abd yeast, pseudomonas aerug  Thank you for allowing pharmacy to be a part of this patient's care.  Prentice Poisson, PharmD Clinical Pharmacist **Pharmacist phone directory can now be found on [email or web address] ([email or web address]).  Listed under [facility].

## 2024-05-22 NOTE — Progress Notes (Signed)
 Interim CCM Progress Note:   Notified [facility] and requested for Candida speciation of BAL on 10/26.  Micafungin empirically started.   Sherlean Sharps AGACNP-BC   [facility] 05/22/2024, 7:44 PM  Please see [email or web address] for pager details.  From 7A-7P if no response, please call [phone]. After hours, please call ELink [phone].

## 2024-05-22 NOTE — H&P (View-Only) (Signed)
 Advanced Heart Failure Team Consult Note   Primary Physician: Lorren Greig PARAS, NP Cardiologist:  None  Reason for Consultation: Acute hypercarbic respiratory failure  HPI:    Keith Gibbs is seen today for evaluation of acute hypercarbic respiratory failure  63 y/o male with bipolar d/o, CAD, COPD, polysubstance abuse,HCV, HTN underwent RUL lobectomty on 10/16 for lung CA  Has had protracted hospital course with progressive respiratory failure with persistent air leak and PTX  Over last 2 days developed ARDS and progressive hypoxemia. Was proned today with improvement in hypoxia but severe hypercarbia.  Resp status has not responded to diuresis. Being treated with abx for possible asp PNA.  Has received iNO and paralytics without favorable response   Echo EF 60-65% RV ok mild to mod AI  We are asked to evaluate for VV ECMO support    Home Medications Prior to Admission medications   Medication Sig Start Date End Date Taking? Authorizing Provider  acetaminophen  (TYLENOL ) 325 MG tablet Take 1 tablet (325 mg total) by mouth every 6 (six) hours as needed for mild pain (pain score 1-3) (or temp > 100.5). 09/01/23  Yes [name], MD  albuterol  (VENTOLIN  HFA) 108 [phone] Base) MCG/ACT inhaler Inhale 2 puffs into the lungs every 6 (six) hours as needed for wheezing or shortness of breath. 12/19/23  Yes Lorren Greig PARAS, NP  aspirin  EC 81 MG tablet Take 1 tablet (81 mg total) by mouth daily. Swallow whole. 02/03/23  Yes Evelena, Nadir, MD  atorvastatin  (LIPITOR ) 80 MG tablet TAKE 1 TABLET BY MOUTH EVERY DAY 01/15/24  Yes [name], MD  budesonide -glycopyrrolate -formoterol  (BREZTRI  AEROSPHERE) 160-9-4.8 MCG/ACT AERO inhaler Inhale 2 puffs into the lungs in the morning and at bedtime. 12/19/23  Yes Lorren, Amy J, NP  celecoxib  (CELEBREX ) 100 MG capsule TAKE 2 CAPSULES BY MOUTH TWICE A DAY 04/25/24  Yes Georgina Ozell LABOR, MD  divalproex  (DEPAKOTE ) 500 MG DR tablet Take half tablet in  the morning and 3 tablets at bedtime Patient taking differently: Take half tablet in the morning, 1 in the afternoon, and 2 tablets at bedtime 04/25/24  Yes Izella Ismael NOVAK, MD  DULoxetine  (CYMBALTA ) 30 MG capsule Take 3 capsules (90 mg total) by mouth daily. Patient taking differently: Take 30 mg by mouth 3 (three) times daily. 04/25/24 06/24/24 Yes Izella Ismael NOVAK, MD  gabapentin  (NEURONTIN ) 600 MG tablet Take 1 tablet (600 mg total) by mouth daily AND 2 tablets (1,200 mg total) at bedtime. 04/25/24  Yes Izella Ismael NOVAK, MD  hydrOXYzine  (ATARAX ) 25 MG tablet Take 1 tablet (25 mg total) by mouth 2 (two) times daily as needed. Patient taking differently: Take 25 mg by mouth daily. 04/25/24  Yes Izella Ismael NOVAK, MD  isosorbide  mononitrate (IMDUR ) 30 MG 24 hr tablet TAKE 1 TABLET BY MOUTH 2 TIMES DAILY. 04/09/24  Yes Fernand Denyse LABOR, MD  Multiple Vitamins-Minerals (MENS 50+ MULTIVITAMIN) TABS Take 1 tablet by mouth in the morning.   Yes [provider]  nicotine  polacrilex (NICOTINE  MINI) 2 MG lozenge Take 1 lozenge (2 mg total) by mouth every 2 (two) hours as needed for smoking cessation. 03/26/24 06/24/24 Yes Dgayli, Belva, MD  nitroGLYCERIN  (NITROSTAT ) 0.4 MG SL tablet Place 1 tablet (0.4 mg total) under the tongue every 5 (five) minutes as needed for chest pain. 02/03/23 05/06/24 Yes Evelena Figures, MD  pantoprazole  (PROTONIX ) 40 MG tablet Take 1 tablet (40 mg total) by mouth daily. 12/19/23  Yes Lorren, Amy  J, NP  QUEtiapine  (SEROQUEL ) 200 MG tablet Take 1 tablet (200 mg total) by mouth at bedtime. 04/25/24  Yes Izella Ismael NOVAK, MD  traZODone  (DESYREL ) 100 MG tablet Take 0.5 tablets (50 mg total) by mouth at bedtime for 7 days. Patient not taking: Reported on 05/06/2024 04/25/24 05/02/24  Izella Ismael NOVAK, MD    Past Medical History: Past Medical History:  Diagnosis Date   Anginal pain    Anxiety    Arthritis    Bipolar 1 disorder (HCC)    Bursitis    CAD (coronary artery disease)     Stent placed 2017   Cancer [facility])    Chronic pain    COPD (chronic obstructive pulmonary disease) (HCC)    Current use of long term anticoagulation    DAPT (ASA + clopidogrel )   Depression    Diverticulitis    Dyspnea    GERD (gastroesophageal reflux disease)    Grade I diastolic dysfunction    Hepatitis C 2012   No longer has Hep C   HLD (hyperlipidemia)    Hypertension    MI (myocardial infarction) (HCC)    Polysubstance abuse (HCC)    cocaine, marijuana, ETOH   PUD (peptic ulcer disease)    S/P angioplasty with stent 06/10/2016   a.) 90% stenosis of pLAD to mLAD - 2.5 x 18 mm Xience Alpine (DES x 1) placed to pLAD   S/P PTCA (percutaneous transluminal coronary angioplasty) 12/04/2019   a.) 60% in stent restenosis of DES to pLAD; LVEF 65%.   Schizophrenia (HCC)    Stroke (HCC)    Tremors    generalized   Valvular insufficiency    a.) Mild MR, TR, PR; mild to moderate AR on 03/05/2018 TTE    Past Surgical History: Past Surgical History:  Procedure Laterality Date   ABDOMINAL SURGERY     removed small piece of intestines due to [facility] Diverticulosis   ANTERIOR LAT LUMBAR FUSION N/A 08/23/2022   Procedure: DIRECT LATERAL INTERBODY FUSION  LUMBAR TWO- LUMBAR THREE, LUMBAR THREE-LUMBAR FOUR , EXPLORE AND EXTEND FUSION LUMBAR TWO-LUMBAR FIVE, POSTERIOR DECOMPRESSION LUMBAR THREE-LUMBAR FOUR, LEFT LUMBAR TWO-LUMBAR THREE;  Surgeon: Debby Dorn MATSU, MD;  Location: [facility] OR;  Service: Neurosurgery;  Laterality: N/A;   APPENDECTOMY     BACK SURGERY     CARDIAC CATHETERIZATION Left 06/10/2016   Procedure: Left Heart Cath and Coronary Angiography;  Surgeon: Denyse DELENA Bathe, MD;  Location: [facility] INVASIVE CV LAB;  Service: Cardiovascular;  Laterality: Left;   CARDIAC CATHETERIZATION N/A 06/10/2016   Procedure: Coronary Stent Intervention;  Surgeon: Cara JONETTA Lovelace, MD;  Location: [facility] INVASIVE CV LAB;  Service: Cardiovascular;  Laterality: N/A;   CHOLECYSTECTOMY N/A 02/17/2022    Procedure: LAPAROSCOPIC CHOLECYSTECTOMY;  Surgeon: Stevie Herlene Righter, MD;  Location: [facility] OR;  Service: General;  Laterality: N/A;   COLON SURGERY     COLONOSCOPY     COLONOSCOPY WITH PROPOFOL  N/A 01/05/2017   Procedure: COLONOSCOPY WITH PROPOFOL ;  Surgeon: Therisa Bi, MD;  Location: [facility] ENDOSCOPY;  Service: Endoscopy;  Laterality: N/A;   COLONOSCOPY WITH PROPOFOL  N/A 02/13/2020   Procedure: COLONOSCOPY WITH PROPOFOL ;  Surgeon: Janalyn Keene NOVAK, MD;  Location: [facility] ENDOSCOPY;  Service: Endoscopy;  Laterality: N/A;   CORONARY ANGIOPLASTY WITH STENT PLACEMENT     CORONARY PRESSURE/FFR STUDY N/A 12/04/2019   Procedure: INTRAVASCULAR PRESSURE WIRE/FFR STUDY;  Surgeon: Wonda Sharper, MD;  Location: [facility] INVASIVE CV LAB;  Service: Cardiovascular;  Laterality: N/A;   ESOPHAGOGASTRODUODENOSCOPY (EGD) WITH PROPOFOL   N/A 01/05/2017   Procedure: ESOPHAGOGASTRODUODENOSCOPY (EGD) WITH PROPOFOL ;  Surgeon: Therisa Bi, MD;  Location: [facility] ENDOSCOPY;  Service: Endoscopy;  Laterality: N/A;   ESOPHAGOGASTRODUODENOSCOPY (EGD) WITH PROPOFOL  N/A 02/13/2020   Procedure: ESOPHAGOGASTRODUODENOSCOPY (EGD) WITH PROPOFOL ;  Surgeon: Janalyn Keene NOVAK, MD;  Location: [facility] ENDOSCOPY;  Service: Endoscopy;  Laterality: N/A;   INTERCOSTAL NERVE BLOCK  05/09/2024   Procedure: BLOCK, NERVE, INTERCOSTAL;  Surgeon: Kerrin Elspeth BROCKS, MD;  Location: [facility] OR;  Service: Thoracic;;   KNEE ARTHROSCOPY WITH MEDIAL MENISECTOMY Right 09/05/2017   Procedure: KNEE ARTHROSCOPY WITH MEDIAL AND LATERAL  MENISECTOMY PARTIAL SYNOVECTOMY;  Surgeon: Kathlynn Sharper, MD;  Location: [facility] ORS;  Service: Orthopedics;  Laterality: Right;   LEFT HEART CATH AND CORONARY ANGIOGRAPHY N/A 12/04/2019   Procedure: LEFT HEART CATH AND CORONARY ANGIOGRAPHY;  Surgeon: Wonda Sharper, MD;  Location: [facility] INVASIVE CV LAB;  Service: Cardiovascular;  Laterality: N/A;   LEFT HEART CATH AND CORONARY ANGIOGRAPHY Left 11/25/2022   Procedure: LEFT HEART CATH AND  CORONARY ANGIOGRAPHY;  Surgeon: Fernand Denyse LABOR, MD;  Location: [facility] INVASIVE CV LAB;  Service: Cardiovascular;  Laterality: Left;   LOBECTOMY, LUNG, ROBOT-ASSISTED, USING VATS Right 05/09/2024   Procedure: LOBECTOMY, LUNG RIGHT UPPER LOBE, ROBOT-ASSISTED, USING VATS;  Surgeon: Kerrin Elspeth BROCKS, MD;  Location[facility] OR;  Service: Thoracic;  Laterality: Right;   LYMPH NODE BIOPSY  05/09/2024   Procedure: LYMPH NODE BIOPSY;  Surgeon: Kerrin Elspeth BROCKS, MD;  Location: [facility] OR;  Service: Thoracic;;   REVERSE SHOULDER ARTHROPLASTY Right 08/31/2023   Procedure: RIGHT REVERSE SHOULDER ARTHROPLASTY;  Surgeon: [name], MD;  Location: [facility] OR;  Service: Orthopedics;  Laterality: Right;   SHOULDER SURGERY Right 04/09/2012   SPINE SURGERY     TOTAL KNEE ARTHROPLASTY Right 01/19/2021   Procedure: TOTAL KNEE ARTHROPLASTY - [place]ford Amber to Assist;  Surgeon: Kathlynn Sharper, MD;  Location: [facility] ORS;  Service: Orthopedics;  Laterality: Right;   WEDGE RESECTION, LUNG, ROBOT-ASSISTED, THORACOSCOPIC Right 05/09/2024   Procedure: WEDGE RESECTION, LUNG RIGHT UPPER LOBE, ROBOT-ASSISTED, THORACOSCOPIC;  Surgeon: Kerrin Elspeth BROCKS, MD;  Location: [facility] OR;  Service: Thoracic;  Laterality: Right;    Family History: Family History  Problem Relation Age of Onset   Osteoarthritis Mother    Heart disease Mother    Hypertension Mother    Depression Mother    Heart disease Father    Early death Father    Hypertension Father    Heart attack Father    Hypertension Sister    Parkinson's disease Maternal Grandfather    Prostate cancer Neg Hx    Bladder Cancer Neg Hx    Kidney cancer Neg Hx    Tremor Neg Hx     Social History: Social History   Socioeconomic History   Marital status: Widowed    Spouse name: Not on file   Number of children: Not on file   Years of education: Not on file   Highest education level: GED or equivalent  Occupational History   Occupation: disability   Occupation: stocking  at gas station    Comment: 2 days per week  Tobacco Use   Smoking status: Former    Current packs/day: 1.50    Average packs/day: 1.5 packs/day for 42.6 years (63.9 ttl pk-yrs)    Types: Cigarettes    Start date: 10/26/1978    Quit date: 2000    Passive exposure: Current   Smokeless tobacco: Never   Tobacco comments:    Smokes 0.5 PPD - khj 04/17/2024  Started smoking at 63 yrs old    Smoked 1.5 PPD at his heaviest        States he quit 04/17/24  Vaping Use   Vaping status: Never Used  Substance and Sexual Activity   Alcohol  use: Not Currently    Alcohol /week: 3.0 standard drinks of alcohol     Types: 3 Cans of beer per week    Comment: occassionally    Drug use: Not Currently    Types: Cocaine, Marijuana    Comment: 3 months ago per pt (05/07/2024)   Sexual activity: Not Currently    Partners: Female    Birth control/protection: None  Other Topics Concern   Not on file  Social History Narrative   Not on file   Social Drivers of Health   Financial Resource Strain: High Risk (04/16/2024)   Overall Financial Resource Strain (CARDIA)    Difficulty of Paying Living Expenses: Hard  Food Insecurity: No Food Insecurity (05/09/2024)   Hunger Vital Sign    Worried About Running Out of Food in the Last Year: Never true    Ran Out of Food in the Last Year: Never true  Recent Concern: Food Insecurity - Food Insecurity Present (04/16/2024)   Hunger Vital Sign    Worried About Running Out of Food in the Last Year: Sometimes true    Ran Out of Food in the Last Year: Sometimes true  Transportation Needs: No Transportation Needs (05/09/2024)   PRAPARE - Administrator, Civil Service (Medical): No    Lack of Transportation (Non-Medical): No  Physical Activity: Inactive (04/16/2024)   Exercise Vital Sign    Days of Exercise per Week: 0 days    Minutes of Exercise per Session: Not on file  Stress: Stress Concern Present (04/16/2024)   [employer] of Occupational  Health - Occupational Stress Questionnaire    Feeling of Stress: Rather much  Social Connections: Moderately Integrated (05/10/2024)   Social Connection and Isolation Panel    Frequency of Communication with Friends and Family: Three times a week    Frequency of Social Gatherings with Friends and Family: Three times a week    Attends Religious Services: More than 4 times per year    Active Member of Clubs or Organizations: Yes    Attends Banker Meetings: More than 4 times per year    Marital Status: Widowed    Allergies:  Allergies  Allergen Reactions   Asenapine Other (See Comments) and Nausea And Vomiting    Increased tremors   Dextromethorphan Hbr    Guaifenesin     Latuda [Lurasidone Hcl] Other (See Comments)    Tremors     Lurasidone Other (See Comments) and Hives    Other Reaction(s): Angioedema   Phenylephrine      Objective:    Vital Signs:   Temp:  [99.5 F (37.5 C)-101.5 F (38.6 C)] 100.4 F (38 C) (10/29 1415) Pulse Rate:  [65-99] 93 (10/29 1415) Resp:  [0-43] 14 (10/29 1415) BP: (101-155)/(51-94) 109/65 (10/29 1415) SpO2:  [74 %-100 %] 95 % (10/29 1415) Arterial Line BP: (97-157)/(46-62) 139/52 (10/29 1415) FiO2 (%):  [80 %] 80 % (10/29 1404) Weight:  [109.5 kg] 109.5 kg (10/29 0500) Last BM Date : 05/15/24  Weight change: Filed Weights   05/20/24 0545 05/21/24 0359 05/22/24 0500  Weight: 113.1 kg 111.5 kg 109.5 kg    Intake/Output:   Intake/Output Summary (Last 24 hours) at 05/22/2024 1523 Last data filed at 05/22/2024 1414 Gross per  24 hour  Intake 3270.06 ml  Output 2635 ml  Net 635.06 ml      Physical Exam    General:  Ill-appearing on vent HEENT: +ETT Neck: supple. LIJ TLC Cor: Regular rate & rhythm. No rubs, gallops or murmurs. Lungs: clear decreased on R  Abdomen: obese soft, nontender, nondistended. No hepatosplenomegaly. No bruits or masses. Good bowel sounds. Extremities: no cyanosis, clubbing, rash, tr  edema Neuro: sedated on vent    Telemetry   Sinus 80-90s. Personally reviewed   Labs   Basic Metabolic Panel: Recent Labs  Lab 05/18/24 0244 05/18/24 1444 05/19/24 0232 05/19/24 0821 05/20/24 0500 05/20/24 1019 05/21/24 0421 05/21/24 1424 05/22/24 0451 05/22/24 1314  NA 128*   < > 131*   < > 135 136 139 140 143 143  K 4.3   < > 4.6   < > 4.6 4.6 4.7 4.6 4.8 5.1  CL 90*  --  91*  --  94*  --  100  --  100  --   CO2 28  --  26  --  25  --  29  --  29  --   GLUCOSE 104*  --  121*  --  185*  --  161*  --  134*  --   BUN 27*  --  27*  --  47*  --  48*  --  51*  --   CREATININE 1.33*  --  1.22  --  1.85*  --  1.12  --  0.98  --   CALCIUM  7.5*  --  7.6*  --  7.6*  --  7.6*  --  7.9*  --   MG  --   --   --   --  2.6*  --  3.3*  --  3.1*  --   PHOS  --   --   --   --  7.1*  --  3.2  --  3.2  --    < > = values in this interval not displayed.    Liver Function Tests: Recent Labs  Lab 05/22/24 0451  AST 42*  ALT 25  ALKPHOS 92  BILITOT 0.4  PROT 5.9*  ALBUMIN  1.9*   No results for input(s): LIPASE, AMYLASE in the last 168 hours. No results for input(s): AMMONIA in the last 168 hours.  CBC: Recent Labs  Lab 05/18/24 0244 05/18/24 1444 05/19/24 0232 05/19/24 0821 05/20/24 0500 05/20/24 1019 05/21/24 0421 05/21/24 1424 05/22/24 0451 05/22/24 1314  WBC 14.5*  --  14.5*  --  16.2*  --  16.2*  --  19.7*  --   NEUTROABS  --   --  10.9*  --  13.3*  --  11.7*  --  13.5*  --   HGB 10.7*   < > 10.3*   < > 11.1* 10.5* 10.3* 10.2* 10.4* 10.5*  HCT 31.1*   < > 30.1*   < > 32.8* 31.0* 30.2* 30.0* 32.0* 31.0*  MCV 95.4  --  95.3  --  96.2  --  96.5  --  100.6*  --   PLT 192  --  174  --  148*  --  177  --  215  --    < > = values in this interval not displayed.    Cardiac Enzymes: No results for input(s): CKTOTAL, CKMB, CKMBINDEX, TROPONINI in the last 168 hours.  BNP: BNP (last 3 results) Recent Labs    05/30/23 1059 03/20/24 1419  BNP 34.3 20.6     ProBNP (last 3 results) No results for input(s): PROBNP in the last 8760 hours.   CBG: Recent Labs  Lab 05/21/24 1957 05/21/24 2344 05/22/24 0455 05/22/24 0749 05/22/24 1242  GLUCAP 177* 181* 132* 110* 113*    Coagulation Studies: No results for input(s): LABPROT, INR in the last 72 hours.   Imaging   DG CHEST PORT 1 VIEW Result Date: 05/22/2024 CLINICAL DATA:  Follow-up right pneumothorax and ARDS. EXAM: PORTABLE CHEST 1 VIEW COMPARISON:  05/21/2024. FINDINGS: Stable right chest tube with its tip near the right lung apex. Decreased size of the previously demonstrated moderate right pneumothorax. No mediastinal shift. Endotracheal tube in satisfactory position. Stable left PICC with its tip in the superior vena cava, approximately 6 cm above the superior cavoatrial junction. Nasogastric tube extending into the stomach. Mildly improved diffuse bilateral airspace opacity. No pleural fluid seen. Right shoulder prosthesis. IMPRESSION: 1. Decreased size of the previously demonstrated moderate right pneumothorax. 2. Mildly improved diffuse bilateral airspace opacity compatible with ARDS. 3. Stable left PICC with its tip in the superior vena cava, approximately 6 cm above the superior cavoatrial junction. Electronically Signed   By: Elspeth Bathe M.D.   On: 05/22/2024 12:58   DG Abd Portable 1V Result Date: 05/22/2024 EXAM: 1 VIEW XRAY OF THE ABDOMEN 05/22/2024 10:07:17 AM COMPARISON: 05/20/2024 CLINICAL HISTORY: feeding tube placement ,? Post pyloric; rover FINDINGS: LINES, TUBES AND DEVICES: Feeding tube in place with tip terminating over the distal stomach. BOWEL: Nonobstructive bowel gas pattern. SOFT TISSUES: No opaque urinary calculi. BONES: Lumbosacral spine fixation hardware and intervertebral disc spacers noted. IMPRESSION: 1. Feeding tube in place with the tip terminating over the distal stomach. Electronically signed by: Donnice Mania MD 05/22/2024 11:02 AM EDT RP  Workstation: [id number]     Medications:     Current Medications:  acetaminophen  (TYLENOL ) oral liquid 160 mg/5 mL  650 mg Per Tube Q6H   arformoterol  15 mcg Nebulization BID   artificial tears  1 Application Both Eyes Q8H   budesonide  (PULMICORT ) nebulizer solution  0.25 mg Nebulization BID   Chlorhexidine  Gluconate Cloth  6 each Topical Daily   dexamethasone  (DECADRON ) injection  20 mg Intravenous Q24H   feeding supplement (PROSource TF20)  60 mL Per Tube Daily   fentaNYL  (SUBLIMAZE ) injection  50 mcg Intravenous Once   furosemide  40 mg Intravenous Daily   gabapentin   600 mg Per Tube Daily   And   gabapentin   1,200 mg Per Tube QHS   insulin aspart  0-6 Units Subcutaneous Q4H   ipratropium-albuterol   3 mL Nebulization Q4H   mouth rinse  15 mL Mouth Rinse Q2H   pantoprazole  (PROTONIX ) IV  40 mg Intravenous Q24H   polyethylene glycol  17 g Per Tube Daily   QUEtiapine   100 mg Per Tube Daily   QUEtiapine   200 mg Per Tube QHS   revefenacin  175 mcg Nebulization Daily   senna-docusate  2 tablet Per Tube QHS   sodium chloride  flush  10-40 mL Intracatheter Q12H    Infusions:  ceFEPime (MAXIPIME) IV 200 mL/hr at 05/22/24 1414   cisatracurium (NIMBEX) 200 mg in sodium chloride  0.9 % 100 mL (2 mg/mL) infusion 3 mcg/kg/min (05/22/24 1414)   feeding supplement (VITAL 1.5 CAL) 40 mL/hr at 05/22/24 1414   fentaNYL  infusion INTRAVENOUS 175 mcg/hr (05/22/24 1414)   heparin  1,700 Units/hr (05/22/24 1414)   norepinephrine (LEVOPHED) Adult infusion Stopped (05/20/24 1221)   propofol  (DIPRIVAN ) infusion  70 mcg/kg/min (05/22/24 1414)   valproate sodium 500 mg (05/22/24 1430)   vasopressin  Stopped (05/21/24 0604)      Assessment/Plan   1. 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 ARDS 2. RUL lung CA s/p lobectomy 3. COPD with ongoing tobacco use 4. Bipolar d/o 5. CAD  Resp Score 2 = 57% in-hospital survival  I have reviewed the case with CCM and TCTS. He has  severe single organ disease unable to adequately oxygenate/ventilate in setting of ARDS and very poor lung compliance.   I am worried about his ability to recover and rehabilitate but consensus among team member is that given single organ dysfunction and functional premorbid state he will have a reasonable chance for recovery. Will proceed with VV ECMO cannulation.   CRITICAL CARE Performed by: Cherrie Sieving  Total critical care time: 65 minutes  Critical care time was exclusive of separately billable procedures and treating other patients.  Critical care was necessary to treat or prevent imminent or life-threatening deterioration.  Critical care was time spent personally by me (independent of midlevel providers or residents) on the following activities: development of treatment plan with patient and/or surrogate as well as nursing, discussions with consultants, evaluation of patient's response to treatment, examination of patient, obtaining history from patient or surrogate, ordering and performing treatments and interventions, ordering and review of laboratory studies, ordering and review of radiographic studies, pulse oximetry and re-evaluation of patient's condition.  Length of Stay: 13  Sieving Cherrie, MD  05/22/2024, 3:23 PM    Advanced Heart Failure Team Pager [phone] (M-F; 7a - 5p)  Please contact [facility] for night-coverage after hours (4p -7a ) and weekends on [email or web address]

## 2024-05-22 NOTE — CV Procedure (Signed)
 ECMO NOTE:   Indication: Respiratory failure due to ARDS   Initial cannulation date: 05/22/24   ECMO type: VV ECMO   Dual lumen inflow/return cannula:   1) 81F [place] placed in RIJ    Daily data:   Flow 3.94L RPM 3160 Sweep  7L   Labs:   ABG    Component Value Date/Time   PHART 7.141 (LL) 05/22/2024 1438   PCO2ART 99.5 (HH) 05/22/2024 1438   PO2ART 135 (H) 05/22/2024 1438   HCO3 33.4 (H) 05/22/2024 1438   TCO2 36 (H) 05/22/2024 1438   ACIDBASEDEF 1.0 05/19/2024 1154   O2SAT 97 05/22/2024 1438    Hgb 10.9 Platelets pending LDH pending PTT pending Lactic acid pending  Plan:   Continue VV ECMO support Await lung recovery     Toribio Fuel, MD  4:52 PM

## 2024-05-22 NOTE — Progress Notes (Signed)
 RT NOTE: RT transported PT on ventilator from room 2H04 to CATH lab for ECMO cannulation and back to room 2H04 with no complications.

## 2024-05-22 NOTE — Procedures (Signed)
 Cortrak  Person Inserting Tube:  Keith Gibbs, RD Tube Type:  Cortrak - 55 inches Tube Size:  10 Tube Location:  Left nare Secured by: Bridle Technique Used to Measure Tube Placement:  Marking at nare/corner of mouth Cortrak Secured At:  89 cm Initial Placement Verification:  Xray  Cortrak Tube Team Note:  Consult received to place a Cortrak feeding tube. Imaging required to confirm placement.  If the tube becomes dislodged please keep the tube and contact the Cortrak team at [email or web address] for replacement.  If after hours and replacement cannot be delayed, place a NG tube and confirm placement with an abdominal x-ray.    Keith Elihue, MS, RDN, LDN Clinical Dietitian I Please reach out via secure chat

## 2024-05-22 NOTE — Progress Notes (Signed)
 TCTS PM Rounding Progress Note  Placed on VV ECMO this afternoon ABG much better Paralyzed and sedated UOP remains adequate  Vitals:   05/22/24 1658 05/22/24 1705  BP:    Pulse:    Resp:    Temp:    SpO2: 100% 100%    Plan: - ICU to bronch - Vent rest - Coming off paralysis, sedation to be lightened per CCM  Con Clunes, MD Cardiothoracic Surgery Pager: [phone]

## 2024-05-22 NOTE — Plan of Care (Addendum)
 Reevaluated the patient post proning.  Patient is paralyzed and synchronous with the vent.  However he is not ventilating well.  ABG showed pH of 7.1 pCO2 of 90 PaO2 of 112.  This has not changed despite respiratory rate going up to 32.  At this stage we have tried all the avenues to ventilate his lungs and several modes including PRVC, APRV, pressure control ventilation, pressure support ventilation nitric oxide inhalation despite his PF ratio remain low.  We eventually proned him but this is not helping the ventilation.  Patient's motor score is 3 and resp score is class III with a 57% in-hospital survival.  At this stage we discussed the case with the ECMO team and Dr.Hendrickson and it was a collective decision to cannulate the patient for VV ECMO.   The risks and benefits are discussed with the patient's mother who is agreeable to proceed.  She understands that the prognosis is not good despite she wants to give it a try.  Once he returns from cannulation I will put him on rest settings and repeat a BAL.  His BAL from 10/26 is reported today as growing rare Pseudomonas and moderate yeast.  He is not being treated currently but will consider treating it once he returns.   Tamela Stakes, MD  Attending Physician, Critical Care Medicine [facility] See Amion for pager If no response to pager, please call [phone] until 7pm After 7pm, Please call E-link [phone]

## 2024-05-22 NOTE — Progress Notes (Signed)
 PHARMACY - ANTICOAGULATION CONSULT NOTE  Pharmacy Consult for heparin  Indication: VTE  Allergies  Allergen Reactions   Asenapine Other (See Comments) and Nausea And Vomiting    Increased tremors   Dextromethorphan Hbr    Guaifenesin     Latuda [Lurasidone Hcl] Other (See Comments)    Tremors     Lurasidone Other (See Comments) and Hives    Other Reaction(s): Angioedema   Phenylephrine      Patient Measurements: Height: 6' 3 (190.5 cm) Weight: 109.5 kg (241 lb 6.5 oz) IBW/kg (Calculated) : 84.5  Vital Signs: Temp: 98.6 F (37 C) (10/29 1845) BP: 120/70 (10/29 1530) Pulse Rate: 66 (10/29 1845)  Labs: Recent Labs    05/21/24 0421 05/21/24 1424 05/22/24 0451 05/22/24 0746 05/22/24 1705 05/22/24 1710 05/22/24 1845 05/22/24 1904  HGB 10.3*   < > 10.4*   < > 8.8* 9.4*  --  8.8*  HCT 30.2*   < > 32.0*   < > 26.0* 30.8*  --  26.0*  PLT 177  --  215  --   --  201  --   --   APTT  --   --   --   --   --  >200*  --   --   LABPROT  --   --   --   --   --  16.8*  --   --   INR  --   --   --   --   --  1.3*  --   --   HEPARINUNFRC 0.31  --  0.25*  --   --  >1.10* >1.10*  --   CREATININE 1.12  --  0.98  --   --  1.11  --   --    < > = values in this interval not displayed.    Estimated Creatinine Clearance: 91 mL/min (by C-G formula based on SCr of 1.11 mg/dL).   Medical History: Past Medical History:  Diagnosis Date   Anginal pain    Anxiety    Arthritis    Bipolar 1 disorder (HCC)    Bursitis    CAD (coronary artery disease)    Stent placed 2017   Cancer (HCC)    Chronic pain    COPD (chronic obstructive pulmonary disease) (HCC)    Current use of long term anticoagulation    DAPT (ASA + clopidogrel )   Depression    Diverticulitis    Dyspnea    GERD (gastroesophageal reflux disease)    Grade I diastolic dysfunction    Hepatitis C 2012   No longer has Hep C   HLD (hyperlipidemia)    Hypertension    MI (myocardial infarction) (HCC)    Polysubstance abuse  (HCC)    cocaine, marijuana, ETOH   PUD (peptic ulcer disease)    S/P angioplasty with stent 06/10/2016   a.) 90% stenosis of pLAD to mLAD - 2.5 x 18 mm Xience Alpine (DES x 1) placed to pLAD   S/P PTCA (percutaneous transluminal coronary angioplasty) 12/04/2019   a.) 60% in stent restenosis of DES to pLAD; LVEF 65%.   Schizophrenia (HCC)    Stroke (HCC)    Tremors    generalized   Valvular insufficiency    a.) Mild MR, TR, PR; mild to moderate AR on 03/05/2018 TTE      Assessment: 63 yoM admitted for RUL lobectomy. Pt intubated, now having difficulty with ventilation. Pharmacy to dose IV heparin  while ruling out VTE.  He is now s/p cannulation today on ECMO. Heparin  resumed post OR and heparin  level > 1.1 (heparin  boluses given in procedure) -last heparin  rate was 1700 units/hr (increased due to heparin  level of 0.25)  Goal of Therapy:  Heparin  level 0.3-0.5 Monitor platelets by anticoagulation protocol: Yes   Plan:  -Hold heparin  for 2 hours then resume at 1700 units/hr -6h heparin  level -Daily heparin  level, CBC  Prentice Poisson, PharmD Clinical Pharmacist **Pharmacist phone directory can now be found on [email or web address] ([email or web address]).  Listed under [facility].

## 2024-05-22 NOTE — Procedures (Signed)
 Arterial Line Insertion Start/End10/29/2025 1:00 PM, 05/22/2024 1:15 PM  Patient location: ICU. Preanesthetic checklist: patient identified, IV checked, site marked, risks and benefits discussed, surgical consent, monitors and equipment checked, pre-op evaluation and timeout performed Left, radial was placed Catheter size: 20 G Hand hygiene performed  and maximum sterile barriers used  Allen's test indicative of satisfactory collateral circulation Attempts: 1 Patient tolerated the procedure well with no immediate complications.

## 2024-05-22 NOTE — Progress Notes (Signed)
 13 Days Post-Op Procedure(s) (LRB): WEDGE RESECTION, LUNG RIGHT UPPER LOBE, ROBOT-ASSISTED, THORACOSCOPIC (Right) LOBECTOMY, LUNG RIGHT UPPER LOBE, ROBOT-ASSISTED, USING VATS (Right) LYMPH NODE BIOPSY BLOCK, NERVE, INTERCOSTAL Subjective: sedated  Objective: Vital signs in last 24 hours: Temp:  [99.3 F (37.4 C)-100.6 F (38.1 C)] 99.7 F (37.6 C) (10/29 0700) Pulse Rate:  [67-93] 70 (10/29 0700) Cardiac Rhythm: Normal sinus rhythm (10/29 0000) Resp:  [7-36] 18 (10/29 0700) BP: (108-154)/(52-84) 144/78 (10/29 0700) SpO2:  [83 %-100 %] 91 % (10/29 0700) Arterial Line BP: (95-132)/(46-65) 113/57 (10/29 0700) FiO2 (%):  [70 %-80 %] 80 % (10/29 0340) Weight:  [109.5 kg] 109.5 kg (10/29 0500)  Hemodynamic parameters for last 24 hours: CVP:  [0 mmHg-13 mmHg] 12 mmHg  Intake/Output from previous day: 10/28 0701 - 10/29 0700 In: 3195.8 [I.V.:1062.9; NG/GT:1107.2; IV Piggyback:1025.7] Out: 3775 [Urine:3675; Chest Tube:100] Intake/Output this shift: No intake/output data recorded.  General appearance: ill appearing Neurologic: sedated Heart: regular rate and rhythm Lungs: rhonchi bilaterally Abdomen: normal findings: soft Extremities: warm + air leak  Lab Results: Recent Labs    05/21/24 0421 05/21/24 1424 05/22/24 0451  WBC 16.2*  --  19.7*  HGB 10.3* 10.2* 10.4*  HCT 30.2* 30.0* 32.0*  PLT 177  --  215   BMET:  Recent Labs    05/21/24 0421 05/21/24 1424 05/22/24 0451  NA 139 140 143  K 4.7 4.6 4.8  CL 100  --  100  CO2 29  --  29  GLUCOSE 161*  --  134*  BUN 48*  --  51*  CREATININE 1.12  --  0.98  CALCIUM  7.6*  --  7.9*    PT/INR: No results for input(s): LABPROT, INR in the last 72 hours. ABG    Component Value Date/Time   PHART 7.384 05/21/2024 1424   HCO3 31.6 (H) 05/21/2024 1424   TCO2 33 (H) 05/21/2024 1424   ACIDBASEDEF 1.0 05/19/2024 1154   O2SAT 91 05/21/2024 1424   CBG (last 3)  Recent Labs    05/21/24 1957 05/21/24 2344  05/22/24 0455  GLUCAP 177* 181* 132*    Assessment/Plan: S/P Procedure(s) (LRB): WEDGE RESECTION, LUNG RIGHT UPPER LOBE, ROBOT-ASSISTED, THORACOSCOPIC (Right) LOBECTOMY, LUNG RIGHT UPPER LOBE, ROBOT-ASSISTED, USING VATS (Right) LYMPH NODE BIOPSY BLOCK, NERVE, INTERCOSTAL Remains critically ill NEURO- sedated CV- in Sr, BP ok off pressors  CVP 10 RESP_ VDRF, ARDS in setting of HAP  Off NO  Diuresed well below preop weight  On steroids  APRV 25, 80%/ 5 PEEP  Still with air leak, right pneumo larger- will try CT on suction   Watch for excessive tidal volume loss with suction  Vent per CCM ID- low grade temps on Vanco and Maxipime for HA penumonia  WBC up slightly (steroids vs infection)  Sputum - yeast with pseudohyphae- will d/w CCM RENAL- creatinine normal, BUN elevated ENDO- CBG moderately elevated Gi- tolerating TF at 50  Will have postpyloric tube placed DVT prophylaxis- on heparin  drip  LOS: 13 days    Keith Gibbs 05/22/2024

## 2024-05-22 NOTE — Plan of Care (Signed)
 Patient is a cannulated for VV ECMO in the Right internal jugular vein with Avalon catheter. He is ventilator is at the rest settings to aim for a Pplat of < 25 He is on Versed  and fentanyl  for sedation will continue this overnight and change it tomorrow. Antibiotics are broadened to vancomycin  and meropenem.  History of Pseudomonas that was cultured from BAL 10/26 Candida is requested for speciation of microbiology.  Micafungin is empirically started after BAL performed again today. Will stop Nimbex Family updated.  Tamela Stakes, MD  Attending Physician, Critical Care Medicine [facility] See Amion for pager If no response to pager, please call [phone] until 7pm After 7pm, Please call E-link [phone]

## 2024-05-22 NOTE — Progress Notes (Signed)
 PHARMACY - ANTICOAGULATION CONSULT NOTE  Pharmacy Consult for heparin  Indication: VTE  Allergies  Allergen Reactions   Asenapine Other (See Comments) and Nausea And Vomiting    Increased tremors   Dextromethorphan Hbr    Guaifenesin     Latuda [Lurasidone Hcl] Other (See Comments)    Tremors     Lurasidone Other (See Comments) and Hives    Other Reaction(s): Angioedema   Phenylephrine      Patient Measurements: Height: 6' 3 (190.5 cm) Weight: 109.5 kg (241 lb 6.5 oz) IBW/kg (Calculated) : 84.5  Vital Signs: Temp: 99.7 F (37.6 C) (10/29 0700) Temp Source: Bladder (10/29 0000) BP: 101/51 (10/29 0810) Pulse Rate: 65 (10/29 0810)  Labs: Recent Labs    05/20/24 0500 05/20/24 0839 05/20/24 1019 05/21/24 0421 05/21/24 1424 05/22/24 0451  HGB 11.1*  --    < > 10.3* 10.2* 10.4*  HCT 32.8*  --    < > 30.2* 30.0* 32.0*  PLT 148*  --   --  177  --  215  HEPARINUNFRC  --  0.50  --  0.31  --  0.25*  CREATININE 1.85*  --   --  1.12  --  0.98   < > = values in this interval not displayed.    Estimated Creatinine Clearance: 103.1 mL/min (by C-G formula based on SCr of 0.98 mg/dL).   Medical History: Past Medical History:  Diagnosis Date   Anginal pain    Anxiety    Arthritis    Bipolar 1 disorder (HCC)    Bursitis    CAD (coronary artery disease)    Stent placed 2017   Cancer (HCC)    Chronic pain    COPD (chronic obstructive pulmonary disease) (HCC)    Current use of long term anticoagulation    DAPT (ASA + clopidogrel )   Depression    Diverticulitis    Dyspnea    GERD (gastroesophageal reflux disease)    Grade I diastolic dysfunction    Hepatitis C 2012   No longer has Hep C   HLD (hyperlipidemia)    Hypertension    MI (myocardial infarction) (HCC)    Polysubstance abuse (HCC)    cocaine, marijuana, ETOH   PUD (peptic ulcer disease)    S/P angioplasty with stent 06/10/2016   a.) 90% stenosis of pLAD to mLAD - 2.5 x 18 mm Xience Alpine (DES x 1) placed  to pLAD   S/P PTCA (percutaneous transluminal coronary angioplasty) 12/04/2019   a.) 60% in stent restenosis of DES to pLAD; LVEF 65%.   Schizophrenia (HCC)    Stroke (HCC)    Tremors    generalized   Valvular insufficiency    a.) Mild MR, TR, PR; mild to moderate AR on 03/05/2018 TTE      Assessment: 63 yoM admitted for RUL lobectomy. Pt intubated, now having difficulty with ventilation. Pharmacy to dose IV heparin  while ruling out VTE. Pt just received SQ enoxaparin  and had CVC placed so will defer bolus.  Heparin  level 0.25 below goal despite low therapeutic yesterday with small rate increase to 1550 units/h.  Resolution of AKI (SCr 1.85>>0.98) would explain increased heparin  clearance.  CBC stable (Hgb 10.4, pltc 215), no infusion issues.  LE dopplers revealed acute thrombosis of R gastrocnemius veins.    Goal of Therapy:  Heparin  level 0.3-0.7 units/ml Monitor platelets by anticoagulation protocol: Yes   Plan:  -Increase heparin  IV to 1700 units/hr  -6h heparin  level -Daily heparin  level, CBC -F/u plans for  CTA for r/o PE  Maurilio Fila, PharmD Clinical Pharmacist 05/22/2024  8:51 AM

## 2024-05-22 NOTE — Procedures (Signed)
 Bronchoscopy Procedure Note  Keith Gibbs  [id number]  1960/08/20  Date:05/22/24  Time:6:12 PM   Provider Performing:Iseah Plouff   Procedure(s):  Flexible bronchoscopy with bronchial alveolar lavage (68375)  Indication(s) ARDS  Consent Risks of the procedure as well as the alternatives and risks of each were explained to the patient and/or caregiver.  Consent for the procedure was obtained and is signed in the bedside chart Spoke with the family in the room he consented for the procedure.  Anesthesia Spinal with sedation and fentanyl .   Time Out Verified patient identification, verified procedure, site/side was marked, verified correct patient position, special equipment/implants available, medications/allergies/relevant history reviewed, required imaging and test results available.   Sterile Technique Usual hand hygiene, masks, gowns, and gloves were used   Procedure Description Bronchoscope advanced through endotracheal tube and into airway.  Airways were examined down to subsegmental level with findings noted below.   Following diagnostic evaluation, BAL(s) performed in 80 with normal saline and return of 30 fluid  Findings: There is a old plaque on the posterior part of the plate At the main carina.  A BAL is performed in the right middle lobe with 80 cc normal saline and a 30 cc return.  Around the black at the main carina.  Is thick mucus around it that was suctioned out.  There is thick mucus secretions bronchus intermedius middle and lower lobes which were suctioned out.  There is thick mucus secretions in the left lower lobe which was suctioned out.    Main Carina.  Complications/Tolerance None; patient tolerated the procedure well. Chest X-ray is not needed post procedure.   EBL Minimal   Specimen(s) BAL from right middle lobe sent for bacterial, fungal and AFB cultures.  Tamela Stakes, MD  Attending Physician, Critical Care Medicine [place]  [facility] See Amion for pager If no response to pager, please call [phone] until 7pm After 7pm, Please call E-link [phone]

## 2024-05-22 NOTE — Progress Notes (Signed)
 NAME:  Keith Gibbs, MRN:  [id number], DOB:  1961/02/27, LOS: 13 ADMISSION DATE:  05/09/2024, CONSULTATION DATE: 05/14/2024 REFERRING MD: CTS, CHIEF COMPLAINT: Acute on chronic hypoxic respiratory failure  History of Present Illness:  62 yo male presented for lobectomy with CTS. Progressing as expected post operatively with CT in place and was on 4E when this morning he decompensated with worsening hypoxemia and increased wob. CTS transferred to ICU and requested CCM evaluation.     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  He was difficult to oxygenate and ventilate therefore APRV started.  Patient seems to be tolerating the APRV. 10/29 -nitric oxide is weaned off overnight.  It has not helped.  It did not change his respiratory status.  Patient continues to have ARDS with bilateral pulmonary infiltrates.  At this stage we decided to proning.  Chest tube is hooked up to suction.  Interim History / Subjective:  Patient remains on 80% FiO2 on APRV.  This morning's PF ratio is 113.  His chest x-ray shows bilateral infiltrates now.  I put him on PRVC after heavy sedation PF did not improve.  At this stage we decided to proning.  I spoke with Dr. Kerrin of CT surgery he is agreeable to proning.  Given the pneumo on the right side we will hook up the chest tube to suction.  Patient is 12 L negative overall.  Will monitor urine output today before further diuresing him.   Objective    Blood pressure (!) 101/51, pulse 65, temperature 99.7 F (37.6 C), resp. rate (!) 29, height 6' 3 (1.905 m), weight 109.5 kg,  SpO2 92%. CVP:  [0 mmHg-13 mmHg] 12 mmHg  Vent Mode: PRVC FiO2 (%):  [70 %-80 %] 80 % Set Rate:  [18 bmp-25 bmp] 18 bmp Vt Set:  [590 mL] 590 mL PEEP:  [5 cmH20-10 cmH20] 10 cmH20 Pressure Support:  [10 cmH20] 10 cmH20 Plateau Pressure:  [22 cmH20] 22 cmH20   Intake/Output Summary (Last 24 hours) at 05/22/2024 1038 Last data filed at 05/22/2024 0940 Gross per 24 hour  Intake 2854.82 ml  Output 3255 ml  Net -400.18 ml   Filed Weights   05/20/24 0545 05/21/24 0359 05/22/24 0500  Weight: 113.1 kg 111.5 kg 109.5 kg    Examination: General: Critically ill appearing man intubated Lungs: Clear lungs no wheeze.  Diminished breath sound right side.  Prolonged expiratory phase Cardiovascular: S1-S2 heard no S3 no murmur Abdomen: Soft nontender nondistended Extremities: No trauma mild pedal edema.  No deformities. 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  No high fevers today.  Current temperature is 99.7.  Acute hypoxic hypercapnic respiratory failure ARDS Status post right upper lobe pneumonectomy History of COPD with acute exacerbation FEV1/ FVC on 9/25 is 74.  PFTs read as mild obstructive lung disease. Acute pulmonary edema Possible aspiration Adenocarcinoma of  the lung status post right upper lobe lobectomy Right pneumothorax was pneumonectomy status post chest tube placement  - Spite APRV for 48 hours with P high of 28 for the last 12 hours patient remained in ARDS with PF ratio less than 150. - We tried PRVC his PF ratio is 113. - Continue Pulmicort  - Continue Decadron  for ARDS and COPD. - DuoNebs every 4 hours - Monitor chest tube output.  Currently has a leak and is tidaling.   Hooked up to suction today. - Hold off further diuresis.  Monitor urine output. - Wean down nitric oxide. - Will increase sedation to goal of RASS -2. - We paralysis with Nimbex - Proning today. - Tube feeds were started.  Septic shock - Continue broad-spectrum antibiotics for now with azithromycin  vancomycin  and meropenem. - Patient is off vasopressor therapy.  Acute metabolic encephalopathy ICU delirium, in the setting of patient with a history of anxiety, and schizoaffective disorder bipolar type  - Currently on propofol  and use fentanyl  if need be. - Start Nimbex. - On Neurontin  and Seroquel . - Stop celocoxib and hold cymbalta  as we cannot crush the medication for tube - Hold Azithromycin   Acute Kidney Injury - likely pre renal from septic shock this is resolved. - Monitor renal functions and urine output. - Monitor electrolytes and replace accordingly.  Concerns for DVT Lower extremity - On empiric heparin   - Doppler shows lower extremity DVT in gastronemius vein - Hold off on CT angio thorax given the high FiO2 needs.  Increased NG output - Abdominal x-ray did not show any ileus. - Tube feeds are ongoing.    Anemia - Hemoglobin stable at 10.4.   Full Code On heparin  gtt. On Protonix    Spoke with the mother and updated.  She agrees to continue full cares for now.  She is discussing goals of care with her daughter and patient sister.   Labs   CBC: Recent Labs  Lab 05/18/24 0244 05/18/24 1444 05/19/24 0232 05/19/24 0821 05/20/24 0500 05/20/24 1019 05/21/24 0421 05/21/24 1424 05/22/24 0451  WBC 14.5*  --  14.5*  --  16.2*  --  16.2*  --  19.7*  NEUTROABS  --   --  10.9*  --  13.3*  --  11.7*  --  13.5*  HGB 10.7*   < > 10.3*   < > 11.1* 10.5* 10.3* 10.2* 10.4*  HCT 31.1*   < > 30.1*   < > 32.8* 31.0* 30.2* 30.0* 32.0*  MCV 95.4  --  95.3  --  96.2  --  96.5  --  100.6*  PLT 192  --  174  --  148*  --  177  --  215   < > = values in this interval not  displayed.    Basic Metabolic Panel: Recent Labs  Lab 05/18/24 0244 05/18/24 1444 05/19/24 0232 05/19/24 0821 05/20/24 0500 05/20/24 1019 05/21/24 0421 05/21/24 1424 05/22/24 0451  NA 128*   < > 131*   < > 135 136 139 140 143  K 4.3   < > 4.6   < > 4.6 4.6 4.7 4.6 4.8  CL 90*  --  91*  --  94*  --  100  --  100  CO2 28  --  26  --  25  --  29  --  29  GLUCOSE 104*  --  121*  --  185*  --  161*  --  134*  BUN 27*  --  27*  --  47*  --  48*  --  51*  CREATININE 1.33*  --  1.22  --  1.85*  --  1.12  --  0.98  CALCIUM  7.5*  --  7.6*  --  7.6*  --  7.6*  --  7.9*  MG  --   --   --   --  2.6*  --  3.3*  --  3.1*  PHOS  --   --   --   --  7.1*  --  3.2  --  3.2   < > = values in this interval not displayed.   GFR: Estimated Creatinine Clearance: 103.1 mL/min (by C-G formula based on SCr of 0.98 mg/dL). Recent Labs  Lab 05/17/24 1535 05/18/24 0244 05/19/24 0232 05/20/24 0500 05/20/24 0839 05/20/24 1140 05/21/24 0421 05/22/24 0451  PROCALCITON 0.59  --   --   --   --   --   --   --   WBC  --    < > 14.5* 16.2*  --   --  16.2* 19.7*  LATICACIDVEN  --   --   --   --  2.2* 2.0*  --   --    < > = values in this interval not displayed.    Liver Function Tests: No results for input(s): AST, ALT, ALKPHOS, BILITOT, PROT, ALBUMIN  in the last 168 hours.  No results for input(s): LIPASE, AMYLASE in the last 168 hours. No results for input(s): AMMONIA in the last 168 hours.  ABG    Component Value Date/Time   PHART 7.384 05/21/2024 1424   PCO2ART 53.0 (H) 05/21/2024 1424   PO2ART 63 (L) 05/21/2024 1424   HCO3 31.6 (H) 05/21/2024 1424   TCO2 33 (H) 05/21/2024 1424   ACIDBASEDEF 1.0 05/19/2024 1154   O2SAT 91 05/21/2024 1424     Coagulation Profile: No results for input(s): INR, PROTIME in the last 168 hours.  Cardiac Enzymes: No results for input(s): CKTOTAL, CKMB, CKMBINDEX, TROPONINI in the last 168 hours.  HbA1C: Hgb A1c MFr Bld   Date/Time Value Ref Range Status  02/12/2024 08:40 AM 5.3 4.8 - 5.6 % Final    Comment:             Prediabetes: 5.7 - 6.4          Diabetes: >6.4          Glycemic control for adults with diabetes: <7.0   04/26/2022 03:30 PM 5.2 4.8 - 5.6 % Final    Comment:    (NOTE) Pre diabetes:          5.7%-6.4%  Diabetes:              >6.4%  Glycemic control for   <7.0% adults with diabetes     CBG: Recent Labs  Lab 05/21/24 1604 05/21/24 1957 05/21/24 2344 05/22/24 0455 05/22/24 0749  GLUCAP 166* 177* 181* 132* 110*    Review of Systems:   Negative except above  Past Medical History:  He,  has a past medical history of Anginal pain, Anxiety, Arthritis, Bipolar 1 disorder (HCC), Bursitis, CAD (coronary artery disease), Cancer (HCC), Chronic pain, COPD (chronic obstructive pulmonary disease) (HCC), Current use of long term anticoagulation, Depression, Diverticulitis, Dyspnea, GERD (gastroesophageal reflux disease), Grade I diastolic dysfunction, Hepatitis C (2012), HLD (hyperlipidemia), Hypertension, MI (myocardial infarction) (HCC), Polysubstance abuse (HCC), PUD (peptic ulcer disease), S/P angioplasty with stent (06/10/2016), S/P PTCA (percutaneous transluminal coronary angioplasty) (12/04/2019), Schizophrenia (HCC), Stroke (  HCC), Tremors, and Valvular insufficiency.   Surgical History:   Past Surgical History:  Procedure Laterality Date   ABDOMINAL SURGERY     removed small piece of intestines due to [facility] Diverticulosis   ANTERIOR LAT LUMBAR FUSION N/A 08/23/2022   Procedure: DIRECT LATERAL INTERBODY FUSION  LUMBAR TWO- LUMBAR THREE, LUMBAR THREE-LUMBAR FOUR , EXPLORE AND EXTEND FUSION LUMBAR TWO-LUMBAR FIVE, POSTERIOR DECOMPRESSION LUMBAR THREE-LUMBAR FOUR, LEFT LUMBAR TWO-LUMBAR THREE;  Surgeon: Debby Dorn MATSU, MD;  Location: [facility] OR;  Service: Neurosurgery;  Laterality: N/A;   APPENDECTOMY     BACK SURGERY     CARDIAC CATHETERIZATION Left 06/10/2016   Procedure: Left  Heart Cath and Coronary Angiography;  Surgeon: Denyse DELENA Bathe, MD;  Location: [facility] INVASIVE CV LAB;  Service: Cardiovascular;  Laterality: Left;   CARDIAC CATHETERIZATION N/A 06/10/2016   Procedure: Coronary Stent Intervention;  Surgeon: Cara JONETTA Lovelace, MD;  Location: [facility] INVASIVE CV LAB;  Service: Cardiovascular;  Laterality: N/A;   CHOLECYSTECTOMY N/A 02/17/2022   Procedure: LAPAROSCOPIC CHOLECYSTECTOMY;  Surgeon: Stevie Herlene Righter, MD;  Location: [facility] OR;  Service: General;  Laterality: N/A;   COLON SURGERY     COLONOSCOPY     COLONOSCOPY WITH PROPOFOL  N/A 01/05/2017   Procedure: COLONOSCOPY WITH PROPOFOL ;  Surgeon: Therisa Bi, MD;  Location: [facility] ENDOSCOPY;  Service: Endoscopy;  Laterality: N/A;   COLONOSCOPY WITH PROPOFOL  N/A 02/13/2020   Procedure: COLONOSCOPY WITH PROPOFOL ;  Surgeon: Janalyn Keene NOVAK, MD;  Location: [facility] ENDOSCOPY;  Service: Endoscopy;  Laterality: N/A;   CORONARY ANGIOPLASTY WITH STENT PLACEMENT     CORONARY PRESSURE/FFR STUDY N/A 12/04/2019   Procedure: INTRAVASCULAR PRESSURE WIRE/FFR STUDY;  Surgeon: Wonda Sharper, MD;  Location: [facility] INVASIVE CV LAB;  Service: Cardiovascular;  Laterality: N/A;   ESOPHAGOGASTRODUODENOSCOPY (EGD) WITH PROPOFOL  N/A 01/05/2017   Procedure: ESOPHAGOGASTRODUODENOSCOPY (EGD) WITH PROPOFOL ;  Surgeon: Therisa Bi, MD;  Location: [facility] ENDOSCOPY;  Service: Endoscopy;  Laterality: N/A;   ESOPHAGOGASTRODUODENOSCOPY (EGD) WITH PROPOFOL  N/A 02/13/2020   Procedure: ESOPHAGOGASTRODUODENOSCOPY (EGD) WITH PROPOFOL ;  Surgeon: Janalyn Keene NOVAK, MD;  Location: [facility] ENDOSCOPY;  Service: Endoscopy;  Laterality: N/A;   INTERCOSTAL NERVE BLOCK  05/09/2024   Procedure: BLOCK, NERVE, INTERCOSTAL;  Surgeon: Kerrin Elspeth BROCKS, MD;  Location: [facility] OR;  Service: Thoracic;;   KNEE ARTHROSCOPY WITH MEDIAL MENISECTOMY Right 09/05/2017   Procedure: KNEE ARTHROSCOPY WITH MEDIAL AND LATERAL  MENISECTOMY PARTIAL SYNOVECTOMY;  Surgeon: Kathlynn Sharper, MD;   Location: [facility] ORS;  Service: Orthopedics;  Laterality: Right;   LEFT HEART CATH AND CORONARY ANGIOGRAPHY N/A 12/04/2019   Procedure: LEFT HEART CATH AND CORONARY ANGIOGRAPHY;  Surgeon: Wonda Sharper, MD;  Location: [facility] INVASIVE CV LAB;  Service: Cardiovascular;  Laterality: N/A;   LEFT HEART CATH AND CORONARY ANGIOGRAPHY Left 11/25/2022   Procedure: LEFT HEART CATH AND CORONARY ANGIOGRAPHY;  Surgeon: Bathe Denyse DELENA, MD;  Location: [facility] INVASIVE CV LAB;  Service: Cardiovascular;  Laterality: Left;   LOBECTOMY, LUNG, ROBOT-ASSISTED, USING VATS Right 05/09/2024   Procedure: LOBECTOMY, LUNG RIGHT UPPER LOBE, ROBOT-ASSISTED, USING VATS;  Surgeon: Kerrin Elspeth BROCKS, MD;  Location: [facility] OR;  Service: Thoracic;  Laterality: Right;   LYMPH NODE BIOPSY  05/09/2024   Procedure: LYMPH NODE BIOPSY;  Surgeon: Kerrin Elspeth BROCKS, MD;  Location: [facility] OR;  Service: Thoracic;;   REVERSE SHOULDER ARTHROPLASTY Right 08/31/2023   Procedure: RIGHT REVERSE SHOULDER ARTHROPLASTY;  Surgeon: [name], MD;  Location: [facility] OR;  Service: Orthopedics;  Laterality: Right;   SHOULDER SURGERY Right 04/09/2012   SPINE SURGERY  TOTAL KNEE ARTHROPLASTY Right 01/19/2021   Procedure: TOTAL KNEE ARTHROPLASTY - [place] Amber to Assist;  Surgeon: Kathlynn Sharper, MD;  Location: [facility] ORS;  Service: Orthopedics;  Laterality: Right;   WEDGE RESECTION, LUNG, ROBOT-ASSISTED, THORACOSCOPIC Right 05/09/2024   Procedure: WEDGE RESECTION, LUNG RIGHT UPPER LOBE, ROBOT-ASSISTED, THORACOSCOPIC;  Surgeon: Kerrin Elspeth BROCKS, MD;  Location: [facility] OR;  Service: Thoracic;  Laterality: Right;     Social History:   reports that he quit smoking about 25 years ago. His smoking use included cigarettes. He started smoking about 45 years ago. He has a 63.9 pack-year smoking history. He has been exposed to tobacco smoke. He has never used smokeless tobacco. He reports that he does not currently use alcohol  after a past usage of about 3.0 standard  drinks of alcohol  per week. He reports that he does not currently use drugs after having used the following drugs: Cocaine and Marijuana.   Family History:  His family history includes Depression in his mother; Early death in his father; Heart attack in his father; Heart disease in his father and mother; Hypertension in his father, mother, and sister; Osteoarthritis in his mother; Parkinson's disease in his maternal grandfather. There is no history of Prostate cancer, Bladder Cancer, Kidney cancer, or Tremor.   Allergies Allergies  Allergen Reactions   Asenapine Other (See Comments) and Nausea And Vomiting    Increased tremors   Dextromethorphan Hbr    Guaifenesin     Latuda [Lurasidone Hcl] Other (See Comments)    Tremors     Lurasidone Other (See Comments) and Hives    Other Reaction(s): Angioedema   Phenylephrine       Home Medications  Prior to Admission medications   Medication Sig Start Date End Date Taking? Authorizing Provider  acetaminophen  (TYLENOL ) 325 MG tablet Take 1 tablet (325 mg total) by mouth every 6 (six) hours as needed for mild pain (pain score 1-3) (or temp > 100.5). 09/01/23  Yes [name], MD  albuterol  (VENTOLIN  HFA) [phone] Base) MCG/ACT inhaler Inhale 2 puffs into the lungs every 6 (six) hours as needed for wheezing or shortness of breath. 12/19/23  Yes Lorren Greig PARAS, NP  aspirin  EC 81 MG tablet Take 1 tablet (81 mg total) by mouth daily. Swallow whole. 02/03/23  Yes Evelena, Nadir, MD  atorvastatin  (LIPITOR ) 80 MG tablet TAKE 1 TABLET BY MOUTH EVERY DAY 01/15/24  Yes [name], MD  budesonide -glycopyrrolate -formoterol  (BREZTRI  AEROSPHERE) 160-9-4.8 MCG/ACT AERO inhaler Inhale 2 puffs into the lungs in the morning and at bedtime. 12/19/23  Yes Lorren, Amy J, NP  celecoxib  (CELEBREX ) 100 MG capsule TAKE 2 CAPSULES BY MOUTH TWICE A DAY 04/25/24  Yes Georgina Sharper LABOR, MD  divalproex  (DEPAKOTE ) 500 MG DR tablet Take half tablet in the morning and 3  tablets at bedtime Patient taking differently: Take half tablet in the morning, 1 in the afternoon, and 2 tablets at bedtime 04/25/24  Yes Izella Ismael NOVAK, MD  DULoxetine  (CYMBALTA ) 30 MG capsule Take 3 capsules (90 mg total) by mouth daily. Patient taking differently: Take 30 mg by mouth 3 (three) times daily. 04/25/24 06/24/24 Yes Izella Ismael NOVAK, MD  gabapentin  (NEURONTIN ) 600 MG tablet Take 1 tablet (600 mg total) by mouth daily AND 2 tablets (1,200 mg total) at bedtime. 04/25/24  Yes Izella Ismael NOVAK, MD  hydrOXYzine  (ATARAX ) 25 MG tablet Take 1 tablet (25 mg total) by mouth 2 (two) times daily as needed. Patient taking differently: Take 25 mg by mouth  daily. 04/25/24  Yes Izella Ismael NOVAK, MD  isosorbide  mononitrate (IMDUR ) 30 MG 24 hr tablet TAKE 1 TABLET BY MOUTH 2 TIMES DAILY. 04/09/24  Yes Fernand Denyse LABOR, MD  Multiple Vitamins-Minerals (MENS 50+ MULTIVITAMIN) TABS Take 1 tablet by mouth in the morning.   Yes [provider]  nicotine  polacrilex (NICOTINE  MINI) 2 MG lozenge Take 1 lozenge (2 mg total) by mouth every 2 (two) hours as needed for smoking cessation. 03/26/24 06/24/24 Yes Dgayli, Belva, MD  nitroGLYCERIN  (NITROSTAT ) 0.4 MG SL tablet Place 1 tablet (0.4 mg total) under the tongue every 5 (five) minutes as needed for chest pain. 02/03/23 05/06/24 Yes Evelena Figures, MD  pantoprazole  (PROTONIX ) 40 MG tablet Take 1 tablet (40 mg total) by mouth daily. 12/19/23  Yes Lorren, Amy J, NP  QUEtiapine  (SEROQUEL ) 200 MG tablet Take 1 tablet (200 mg total) by mouth at bedtime. 04/25/24  Yes Izella Ismael NOVAK, MD  traZODone  (DESYREL ) 100 MG tablet Take 0.5 tablets (50 mg total) by mouth at bedtime for 7 days. Patient not taking: Reported on 05/06/2024 04/25/24 05/02/24  Izella Ismael NOVAK, MD          My critical care time: 45 minutes   05/22/2024, 10:38 AM  Tamela Stakes, MD  Attending Physician, Critical Care Medicine [facility] See Amion for pager If no  response to pager, please call [phone] until 7pm After 7pm, Please call E-link [phone]

## 2024-05-22 NOTE — Progress Notes (Signed)
 ECMO INITIATION   Patient: Keith Gibbs, 01-28-1961, 63 y.o. Location:   Date of Service:  05/22/2024     Time: 8:16 PM  Date of Admission: 05/09/2024 Admitting diagnosis: Status post robot-assisted surgical procedure  Ht: 6' 3 (190.5 cm) Wt: 109.5 kg BSA: Body surface area is 2.41 meters squared.  Blood Type: O POS Allergies:  Allergies  Allergen Reactions   Asenapine Other (See Comments) and Nausea And Vomiting    Increased tremors   Dextromethorphan Hbr    Guaifenesin     Latuda [Lurasidone Hcl] Other (See Comments)    Tremors     Lurasidone Other (See Comments) and Hives    Other Reaction(s): Angioedema   Phenylephrine      Past medical history:  Past Medical History:  Diagnosis Date   Anginal pain    Anxiety    Arthritis    Bipolar 1 disorder (HCC)    Bursitis    CAD (coronary artery disease)    Stent placed 2017   Cancer (HCC)    Chronic pain    COPD (chronic obstructive pulmonary disease) (HCC)    Current use of long term anticoagulation    DAPT (ASA + clopidogrel )   Depression    Diverticulitis    Dyspnea    GERD (gastroesophageal reflux disease)    Grade I diastolic dysfunction    Hepatitis C 2012   No longer has Hep C   HLD (hyperlipidemia)    Hypertension    MI (myocardial infarction) (HCC)    Polysubstance abuse (HCC)    cocaine, marijuana, ETOH   PUD (peptic ulcer disease)    S/P angioplasty with stent 06/10/2016   a.) 90% stenosis of pLAD to mLAD - 2.5 x 18 mm Xience Alpine (DES x 1) placed to pLAD   S/P PTCA (percutaneous transluminal coronary angioplasty) 12/04/2019   a.) 60% in stent restenosis of DES to pLAD; LVEF 65%.   Schizophrenia (HCC)    Stroke (HCC)    Tremors    generalized   Valvular insufficiency    a.) Mild MR, TR, PR; mild to moderate AR on 03/05/2018 TTE   Past surgical history:  Past Surgical History:  Procedure Laterality Date   ABDOMINAL SURGERY     removed small piece of intestines due to [facility]  Diverticulosis   ANTERIOR LAT LUMBAR FUSION N/A 08/23/2022   Procedure: DIRECT LATERAL INTERBODY FUSION  LUMBAR TWO- LUMBAR THREE, LUMBAR THREE-LUMBAR FOUR , EXPLORE AND EXTEND FUSION LUMBAR TWO-LUMBAR FIVE, POSTERIOR DECOMPRESSION LUMBAR THREE-LUMBAR FOUR, LEFT LUMBAR TWO-LUMBAR THREE;  Surgeon: Debby Dorn MATSU, MD;  Location: [facility] OR;  Service: Neurosurgery;  Laterality: N/A;   APPENDECTOMY     BACK SURGERY     CARDIAC CATHETERIZATION Left 06/10/2016   Procedure: Left Heart Cath and Coronary Angiography;  Surgeon: Denyse DELENA Bathe, MD;  Location: [facility] INVASIVE CV LAB;  Service: Cardiovascular;  Laterality: Left;   CARDIAC CATHETERIZATION N/A 06/10/2016   Procedure: Coronary Stent Intervention;  Surgeon: Cara JONETTA Lovelace, MD;  Location: [facility] INVASIVE CV LAB;  Service: Cardiovascular;  Laterality: N/A;   CHOLECYSTECTOMY N/A 02/17/2022   Procedure: LAPAROSCOPIC CHOLECYSTECTOMY;  Surgeon: Stevie Herlene Righter, MD;  Location: [facility] OR;  Service: General;  Laterality: N/A;   COLON SURGERY     COLONOSCOPY     COLONOSCOPY WITH PROPOFOL  N/A 01/05/2017   Procedure: COLONOSCOPY WITH PROPOFOL ;  Surgeon: Therisa Bi, MD;  Location: [facility] ENDOSCOPY;  Service: Endoscopy;  Laterality: N/A;   COLONOSCOPY WITH PROPOFOL  N/A 02/13/2020   Procedure:  COLONOSCOPY WITH PROPOFOL ;  Surgeon: Janalyn Keene NOVAK, MD;  Location: [facility] ENDOSCOPY;  Service: Endoscopy;  Laterality: N/A;   CORONARY ANGIOPLASTY WITH STENT PLACEMENT     CORONARY PRESSURE/FFR STUDY N/A 12/04/2019   Procedure: INTRAVASCULAR PRESSURE WIRE/FFR STUDY;  Surgeon: Wonda Sharper, MD;  Location: [facility] INVASIVE CV LAB;  Service: Cardiovascular;  Laterality: N/A;   ESOPHAGOGASTRODUODENOSCOPY (EGD) WITH PROPOFOL  N/A 01/05/2017   Procedure: ESOPHAGOGASTRODUODENOSCOPY (EGD) WITH PROPOFOL ;  Surgeon: Therisa Bi, MD;  Location: [facility] ENDOSCOPY;  Service: Endoscopy;  Laterality: N/A;   ESOPHAGOGASTRODUODENOSCOPY (EGD) WITH PROPOFOL  N/A 02/13/2020   Procedure:  ESOPHAGOGASTRODUODENOSCOPY (EGD) WITH PROPOFOL ;  Surgeon: Janalyn Keene NOVAK, MD;  Location: [facility] ENDOSCOPY;  Service: Endoscopy;  Laterality: N/A;   INTERCOSTAL NERVE BLOCK  05/09/2024   Procedure: BLOCK, NERVE, INTERCOSTAL;  Surgeon: Kerrin Elspeth BROCKS, MD;  Location: [facility] OR;  Service: Thoracic;;   KNEE ARTHROSCOPY WITH MEDIAL MENISECTOMY Right 09/05/2017   Procedure: KNEE ARTHROSCOPY WITH MEDIAL AND LATERAL  MENISECTOMY PARTIAL SYNOVECTOMY;  Surgeon: Kathlynn Sharper, MD;  Location: [facility] ORS;  Service: Orthopedics;  Laterality: Right;   LEFT HEART CATH AND CORONARY ANGIOGRAPHY N/A 12/04/2019   Procedure: LEFT HEART CATH AND CORONARY ANGIOGRAPHY;  Surgeon: Wonda Sharper, MD;  Location: [facility] INVASIVE CV LAB;  Service: Cardiovascular;  Laterality: N/A;   LEFT HEART CATH AND CORONARY ANGIOGRAPHY Left 11/25/2022   Procedure: LEFT HEART CATH AND CORONARY ANGIOGRAPHY;  Surgeon: Fernand Denyse LABOR, MD;  Location: [facility] INVASIVE CV LAB;  Service: Cardiovascular;  Laterality: Left;   LOBECTOMY, LUNG, ROBOT-ASSISTED, USING VATS Right 05/09/2024   Procedure: LOBECTOMY, LUNG RIGHT UPPER LOBE, ROBOT-ASSISTED, USING VATS;  Surgeon: Kerrin Elspeth BROCKS, MD;  Location: [facility] OR;  Service: Thoracic;  Laterality: Right;   LYMPH NODE BIOPSY  05/09/2024   Procedure: LYMPH NODE BIOPSY;  Surgeon: Kerrin Elspeth BROCKS, MD;  Location: [facility] OR;  Service: Thoracic;;   REVERSE SHOULDER ARTHROPLASTY Right 08/31/2023   Procedure: RIGHT REVERSE SHOULDER ARTHROPLASTY;  Surgeon: [name], MD;  Location: [facility] OR;  Service: Orthopedics;  Laterality: Right;   SHOULDER SURGERY Right 04/09/2012   SPINE SURGERY     TOTAL KNEE ARTHROPLASTY Right 01/19/2021   Procedure: TOTAL KNEE ARTHROPLASTY - [place] Amber to Assist;  Surgeon: Kathlynn Sharper, MD;  Location: [facility] ORS;  Service: Orthopedics;  Laterality: Right;   WEDGE RESECTION, LUNG, ROBOT-ASSISTED, THORACOSCOPIC Right 05/09/2024   Procedure: WEDGE RESECTION, LUNG RIGHT UPPER LOBE,  ROBOT-ASSISTED, THORACOSCOPIC;  Surgeon: Kerrin Elspeth BROCKS, MD;  Location: [facility] OR;  Service: Thoracic;  Laterality: Right;    Indication for ECMO: ARDS  ECMO was deployed at 1430 and initiated at 1629  Anticoagulation achieved with Heparin  bolus of 5000 units given to patient at 1635. Cannulated for ECMO Mode: VV and achieved initial ECMO Flow (LPM): 4.77 and ECMO Sweep Gas (LPM): 8.    ECMO Cannula Information     Staff Present  Primary Perfusionist   Assisting Perfusionist/ECMO Specialist Montague Emmory Solivan RN   Cannulating Physician Rolan Fuel MD   ECMO Lot Numbers  CardioHelp Console  [id number]  Oxygenator  [id number]  Tubing Pack  [id number]  ECMO Goals  Flow goal   Flow Goal: ~4LPM  Anticoagulation goal      Cardiac goal      Respiratory goal   Respiratory Goal: Sat >88, pCO2 35-45, pH 7.35-7.45  Other goal   Other Goal: Hgb >8   ECMO Handoff  Patient Information * Age Height Weight BSA IBW BMI  63 y.o. 6' 3 (190.5 cm)  (109.5 kg  Body surface area is 2.41 meters squared. No data recorded Body mass index is 30.17 kg/m.   Review History * Primary Diagnosis   Status post robot-assisted surgical procedure  Prior Cardiac Arrest within 24hrs of ECMO initiation?   ECMO and MCS * Type ECMO Flow ECMO Sweep Gases   ECMO Device: Cardiohelp   Flow (LPM): 4.77   Sweep Gas (LPM): 8     Additional Mechanical Support   Ventilation *    $ Ventilator Initial/Subsequent : Subsequent, Vent Mode: (S) PCV (per CCM), Vt Set: 540 mL, Set Rate: (S) 15 bmp, FiO2 (%): (S) 60 % (per CCM), I Time: 0.7 Sec(s), PEEP: 10 cmH20     Cannula Size and Locations   32Fr Cresent RIJ    Drainage Dual lumen   Return Dual lumen    *Cannula(e) sutured and anchored, secured and dressed.   Labs and Imaging *  *Cannulation position verified via imaging on arrival to ICU. Concerns communicated to attending surgeon. Labs reviewed.   All ECMO safety checks complete. ECMO flowsheet  initiated, applicable charges captured, LDA's entered/confirmed, imaging and labs verified, blood products available, and report given to [facility][email or web address]

## 2024-05-22 NOTE — Consult Note (Signed)
 Advanced Heart Failure Team Consult Note   Primary Physician: Lorren Greig PARAS, NP Cardiologist:  None  Reason for Consultation: Acute hypercarbic respiratory failure  HPI:    Keith Gibbs is seen today for evaluation of acute hypercarbic respiratory failure  63 y/o male with bipolar d/o, CAD, COPD, polysubstance abuse,HCV, HTN underwent RUL lobectomty on 10/16 for lung CA  Has had protracted hospital course with progressive respiratory failure with persistent air leak and PTX  Over last 2 days developed ARDS and progressive hypoxemia. Was proned today with improvement in hypoxia but severe hypercarbia.  Resp status has not responded to diuresis. Being treated with abx for possible asp PNA.  Has received iNO and paralytics without favorable response   Echo EF 60-65% RV ok mild to mod AI  We are asked to evaluate for VV ECMO support    Home Medications Prior to Admission medications   Medication Sig Start Date End Date Taking? Authorizing Provider  acetaminophen  (TYLENOL ) 325 MG tablet Take 1 tablet (325 mg total) by mouth every 6 (six) hours as needed for mild pain (pain score 1-3) (or temp > 100.5). 09/01/23  Yes [name], MD  albuterol  (VENTOLIN  HFA) 108 [phone] Base) MCG/ACT inhaler Inhale 2 puffs into the lungs every 6 (six) hours as needed for wheezing or shortness of breath. 12/19/23  Yes Lorren Greig PARAS, NP  aspirin  EC 81 MG tablet Take 1 tablet (81 mg total) by mouth daily. Swallow whole. 02/03/23  Yes Evelena, Nadir, MD  atorvastatin  (LIPITOR ) 80 MG tablet TAKE 1 TABLET BY MOUTH EVERY DAY 01/15/24  Yes [name], MD  budesonide -glycopyrrolate -formoterol  (BREZTRI  AEROSPHERE) 160-9-4.8 MCG/ACT AERO inhaler Inhale 2 puffs into the lungs in the morning and at bedtime. 12/19/23  Yes Lorren, Amy J, NP  celecoxib  (CELEBREX ) 100 MG capsule TAKE 2 CAPSULES BY MOUTH TWICE A DAY 04/25/24  Yes Georgina Ozell LABOR, MD  divalproex  (DEPAKOTE ) 500 MG DR tablet Take half tablet in  the morning and 3 tablets at bedtime Patient taking differently: Take half tablet in the morning, 1 in the afternoon, and 2 tablets at bedtime 04/25/24  Yes Izella Ismael NOVAK, MD  DULoxetine  (CYMBALTA ) 30 MG capsule Take 3 capsules (90 mg total) by mouth daily. Patient taking differently: Take 30 mg by mouth 3 (three) times daily. 04/25/24 06/24/24 Yes Izella Ismael NOVAK, MD  gabapentin  (NEURONTIN ) 600 MG tablet Take 1 tablet (600 mg total) by mouth daily AND 2 tablets (1,200 mg total) at bedtime. 04/25/24  Yes Izella Ismael NOVAK, MD  hydrOXYzine  (ATARAX ) 25 MG tablet Take 1 tablet (25 mg total) by mouth 2 (two) times daily as needed. Patient taking differently: Take 25 mg by mouth daily. 04/25/24  Yes Izella Ismael NOVAK, MD  isosorbide  mononitrate (IMDUR ) 30 MG 24 hr tablet TAKE 1 TABLET BY MOUTH 2 TIMES DAILY. 04/09/24  Yes Fernand Denyse LABOR, MD  Multiple Vitamins-Minerals (MENS 50+ MULTIVITAMIN) TABS Take 1 tablet by mouth in the morning.   Yes [provider]  nicotine  polacrilex (NICOTINE  MINI) 2 MG lozenge Take 1 lozenge (2 mg total) by mouth every 2 (two) hours as needed for smoking cessation. 03/26/24 06/24/24 Yes Dgayli, Belva, MD  nitroGLYCERIN  (NITROSTAT ) 0.4 MG SL tablet Place 1 tablet (0.4 mg total) under the tongue every 5 (five) minutes as needed for chest pain. 02/03/23 05/06/24 Yes Evelena Figures, MD  pantoprazole  (PROTONIX ) 40 MG tablet Take 1 tablet (40 mg total) by mouth daily. 12/19/23  Yes Lorren, Amy  J, NP  QUEtiapine  (SEROQUEL ) 200 MG tablet Take 1 tablet (200 mg total) by mouth at bedtime. 04/25/24  Yes Izella Ismael NOVAK, MD  traZODone  (DESYREL ) 100 MG tablet Take 0.5 tablets (50 mg total) by mouth at bedtime for 7 days. Patient not taking: Reported on 05/06/2024 04/25/24 05/02/24  Izella Ismael NOVAK, MD    Past Medical History: Past Medical History:  Diagnosis Date   Anginal pain    Anxiety    Arthritis    Bipolar 1 disorder (HCC)    Bursitis    CAD (coronary artery disease)     Stent placed 2017   Cancer [facility])    Chronic pain    COPD (chronic obstructive pulmonary disease) (HCC)    Current use of long term anticoagulation    DAPT (ASA + clopidogrel )   Depression    Diverticulitis    Dyspnea    GERD (gastroesophageal reflux disease)    Grade I diastolic dysfunction    Hepatitis C 2012   No longer has Hep C   HLD (hyperlipidemia)    Hypertension    MI (myocardial infarction) (HCC)    Polysubstance abuse (HCC)    cocaine, marijuana, ETOH   PUD (peptic ulcer disease)    S/P angioplasty with stent 06/10/2016   a.) 90% stenosis of pLAD to mLAD - 2.5 x 18 mm Xience Alpine (DES x 1) placed to pLAD   S/P PTCA (percutaneous transluminal coronary angioplasty) 12/04/2019   a.) 60% in stent restenosis of DES to pLAD; LVEF 65%.   Schizophrenia (HCC)    Stroke (HCC)    Tremors    generalized   Valvular insufficiency    a.) Mild MR, TR, PR; mild to moderate AR on 03/05/2018 TTE    Past Surgical History: Past Surgical History:  Procedure Laterality Date   ABDOMINAL SURGERY     removed small piece of intestines due to [facility] Diverticulosis   ANTERIOR LAT LUMBAR FUSION N/A 08/23/2022   Procedure: DIRECT LATERAL INTERBODY FUSION  LUMBAR TWO- LUMBAR THREE, LUMBAR THREE-LUMBAR FOUR , EXPLORE AND EXTEND FUSION LUMBAR TWO-LUMBAR FIVE, POSTERIOR DECOMPRESSION LUMBAR THREE-LUMBAR FOUR, LEFT LUMBAR TWO-LUMBAR THREE;  Surgeon: Debby Dorn MATSU, MD;  Location: [facility] OR;  Service: Neurosurgery;  Laterality: N/A;   APPENDECTOMY     BACK SURGERY     CARDIAC CATHETERIZATION Left 06/10/2016   Procedure: Left Heart Cath and Coronary Angiography;  Surgeon: Denyse DELENA Bathe, MD;  Location: [facility] INVASIVE CV LAB;  Service: Cardiovascular;  Laterality: Left;   CARDIAC CATHETERIZATION N/A 06/10/2016   Procedure: Coronary Stent Intervention;  Surgeon: Cara JONETTA Lovelace, MD;  Location: [facility] INVASIVE CV LAB;  Service: Cardiovascular;  Laterality: N/A;   CHOLECYSTECTOMY N/A 02/17/2022    Procedure: LAPAROSCOPIC CHOLECYSTECTOMY;  Surgeon: Stevie Herlene Righter, MD;  Location: [facility] OR;  Service: General;  Laterality: N/A;   COLON SURGERY     COLONOSCOPY     COLONOSCOPY WITH PROPOFOL  N/A 01/05/2017   Procedure: COLONOSCOPY WITH PROPOFOL ;  Surgeon: Therisa Bi, MD;  Location: [facility] ENDOSCOPY;  Service: Endoscopy;  Laterality: N/A;   COLONOSCOPY WITH PROPOFOL  N/A 02/13/2020   Procedure: COLONOSCOPY WITH PROPOFOL ;  Surgeon: Janalyn Keene NOVAK, MD;  Location: [facility] ENDOSCOPY;  Service: Endoscopy;  Laterality: N/A;   CORONARY ANGIOPLASTY WITH STENT PLACEMENT     CORONARY PRESSURE/FFR STUDY N/A 12/04/2019   Procedure: INTRAVASCULAR PRESSURE WIRE/FFR STUDY;  Surgeon: Wonda Sharper, MD;  Location: [facility] INVASIVE CV LAB;  Service: Cardiovascular;  Laterality: N/A;   ESOPHAGOGASTRODUODENOSCOPY (EGD) WITH PROPOFOL   N/A 01/05/2017   Procedure: ESOPHAGOGASTRODUODENOSCOPY (EGD) WITH PROPOFOL ;  Surgeon: Therisa Bi, MD;  Location: [facility] ENDOSCOPY;  Service: Endoscopy;  Laterality: N/A;   ESOPHAGOGASTRODUODENOSCOPY (EGD) WITH PROPOFOL  N/A 02/13/2020   Procedure: ESOPHAGOGASTRODUODENOSCOPY (EGD) WITH PROPOFOL ;  Surgeon: Janalyn Keene NOVAK, MD;  Location: [facility] ENDOSCOPY;  Service: Endoscopy;  Laterality: N/A;   INTERCOSTAL NERVE BLOCK  05/09/2024   Procedure: BLOCK, NERVE, INTERCOSTAL;  Surgeon: Kerrin Elspeth BROCKS, MD;  Location: [facility] OR;  Service: Thoracic;;   KNEE ARTHROSCOPY WITH MEDIAL MENISECTOMY Right 09/05/2017   Procedure: KNEE ARTHROSCOPY WITH MEDIAL AND LATERAL  MENISECTOMY PARTIAL SYNOVECTOMY;  Surgeon: Kathlynn Sharper, MD;  Location: [facility] ORS;  Service: Orthopedics;  Laterality: Right;   LEFT HEART CATH AND CORONARY ANGIOGRAPHY N/A 12/04/2019   Procedure: LEFT HEART CATH AND CORONARY ANGIOGRAPHY;  Surgeon: Wonda Sharper, MD;  Location: [facility] INVASIVE CV LAB;  Service: Cardiovascular;  Laterality: N/A;   LEFT HEART CATH AND CORONARY ANGIOGRAPHY Left 11/25/2022   Procedure: LEFT HEART CATH AND  CORONARY ANGIOGRAPHY;  Surgeon: Fernand Denyse LABOR, MD;  Location: [facility] INVASIVE CV LAB;  Service: Cardiovascular;  Laterality: Left;   LOBECTOMY, LUNG, ROBOT-ASSISTED, USING VATS Right 05/09/2024   Procedure: LOBECTOMY, LUNG RIGHT UPPER LOBE, ROBOT-ASSISTED, USING VATS;  Surgeon: Kerrin Elspeth BROCKS, MD;  Location[facility] OR;  Service: Thoracic;  Laterality: Right;   LYMPH NODE BIOPSY  05/09/2024   Procedure: LYMPH NODE BIOPSY;  Surgeon: Kerrin Elspeth BROCKS, MD;  Location: [facility] OR;  Service: Thoracic;;   REVERSE SHOULDER ARTHROPLASTY Right 08/31/2023   Procedure: RIGHT REVERSE SHOULDER ARTHROPLASTY;  Surgeon: [name], MD;  Location: [facility] OR;  Service: Orthopedics;  Laterality: Right;   SHOULDER SURGERY Right 04/09/2012   SPINE SURGERY     TOTAL KNEE ARTHROPLASTY Right 01/19/2021   Procedure: TOTAL KNEE ARTHROPLASTY - [place]ford Amber to Assist;  Surgeon: Kathlynn Sharper, MD;  Location: [facility] ORS;  Service: Orthopedics;  Laterality: Right;   WEDGE RESECTION, LUNG, ROBOT-ASSISTED, THORACOSCOPIC Right 05/09/2024   Procedure: WEDGE RESECTION, LUNG RIGHT UPPER LOBE, ROBOT-ASSISTED, THORACOSCOPIC;  Surgeon: Kerrin Elspeth BROCKS, MD;  Location: [facility] OR;  Service: Thoracic;  Laterality: Right;    Family History: Family History  Problem Relation Age of Onset   Osteoarthritis Mother    Heart disease Mother    Hypertension Mother    Depression Mother    Heart disease Father    Early death Father    Hypertension Father    Heart attack Father    Hypertension Sister    Parkinson's disease Maternal Grandfather    Prostate cancer Neg Hx    Bladder Cancer Neg Hx    Kidney cancer Neg Hx    Tremor Neg Hx     Social History: Social History   Socioeconomic History   Marital status: Widowed    Spouse name: Not on file   Number of children: Not on file   Years of education: Not on file   Highest education level: GED or equivalent  Occupational History   Occupation: disability   Occupation: stocking  at gas station    Comment: 2 days per week  Tobacco Use   Smoking status: Former    Current packs/day: 1.50    Average packs/day: 1.5 packs/day for 42.6 years (63.9 ttl pk-yrs)    Types: Cigarettes    Start date: 10/26/1978    Quit date: 2000    Passive exposure: Current   Smokeless tobacco: Never   Tobacco comments:    Smokes 0.5 PPD - khj 04/17/2024  Started smoking at 63 yrs old    Smoked 1.5 PPD at his heaviest        States he quit 04/17/24  Vaping Use   Vaping status: Never Used  Substance and Sexual Activity   Alcohol  use: Not Currently    Alcohol /week: 3.0 standard drinks of alcohol     Types: 3 Cans of beer per week    Comment: occassionally    Drug use: Not Currently    Types: Cocaine, Marijuana    Comment: 3 months ago per pt (05/07/2024)   Sexual activity: Not Currently    Partners: Female    Birth control/protection: None  Other Topics Concern   Not on file  Social History Narrative   Not on file   Social Drivers of Health   Financial Resource Strain: High Risk (04/16/2024)   Overall Financial Resource Strain (CARDIA)    Difficulty of Paying Living Expenses: Hard  Food Insecurity: No Food Insecurity (05/09/2024)   Hunger Vital Sign    Worried About Running Out of Food in the Last Year: Never true    Ran Out of Food in the Last Year: Never true  Recent Concern: Food Insecurity - Food Insecurity Present (04/16/2024)   Hunger Vital Sign    Worried About Running Out of Food in the Last Year: Sometimes true    Ran Out of Food in the Last Year: Sometimes true  Transportation Needs: No Transportation Needs (05/09/2024)   PRAPARE - Administrator, Civil Service (Medical): No    Lack of Transportation (Non-Medical): No  Physical Activity: Inactive (04/16/2024)   Exercise Vital Sign    Days of Exercise per Week: 0 days    Minutes of Exercise per Session: Not on file  Stress: Stress Concern Present (04/16/2024)   [employer] of Occupational  Health - Occupational Stress Questionnaire    Feeling of Stress: Rather much  Social Connections: Moderately Integrated (05/10/2024)   Social Connection and Isolation Panel    Frequency of Communication with Friends and Family: Three times a week    Frequency of Social Gatherings with Friends and Family: Three times a week    Attends Religious Services: More than 4 times per year    Active Member of Clubs or Organizations: Yes    Attends Banker Meetings: More than 4 times per year    Marital Status: Widowed    Allergies:  Allergies  Allergen Reactions   Asenapine Other (See Comments) and Nausea And Vomiting    Increased tremors   Dextromethorphan Hbr    Guaifenesin     Latuda [Lurasidone Hcl] Other (See Comments)    Tremors     Lurasidone Other (See Comments) and Hives    Other Reaction(s): Angioedema   Phenylephrine      Objective:    Vital Signs:   Temp:  [99.5 F (37.5 C)-101.5 F (38.6 C)] 100.4 F (38 C) (10/29 1415) Pulse Rate:  [65-99] 93 (10/29 1415) Resp:  [0-43] 14 (10/29 1415) BP: (101-155)/(51-94) 109/65 (10/29 1415) SpO2:  [74 %-100 %] 95 % (10/29 1415) Arterial Line BP: (97-157)/(46-62) 139/52 (10/29 1415) FiO2 (%):  [80 %] 80 % (10/29 1404) Weight:  [109.5 kg] 109.5 kg (10/29 0500) Last BM Date : 05/15/24  Weight change: Filed Weights   05/20/24 0545 05/21/24 0359 05/22/24 0500  Weight: 113.1 kg 111.5 kg 109.5 kg    Intake/Output:   Intake/Output Summary (Last 24 hours) at 05/22/2024 1523 Last data filed at 05/22/2024 1414 Gross per  24 hour  Intake 3270.06 ml  Output 2635 ml  Net 635.06 ml      Physical Exam    General:  Ill-appearing on vent HEENT: +ETT Neck: supple. LIJ TLC Cor: Regular rate & rhythm. No rubs, gallops or murmurs. Lungs: clear decreased on R  Abdomen: obese soft, nontender, nondistended. No hepatosplenomegaly. No bruits or masses. Good bowel sounds. Extremities: no cyanosis, clubbing, rash, tr  edema Neuro: sedated on vent    Telemetry   Sinus 80-90s. Personally reviewed   Labs   Basic Metabolic Panel: Recent Labs  Lab 05/18/24 0244 05/18/24 1444 05/19/24 0232 05/19/24 0821 05/20/24 0500 05/20/24 1019 05/21/24 0421 05/21/24 1424 05/22/24 0451 05/22/24 1314  NA 128*   < > 131*   < > 135 136 139 140 143 143  K 4.3   < > 4.6   < > 4.6 4.6 4.7 4.6 4.8 5.1  CL 90*  --  91*  --  94*  --  100  --  100  --   CO2 28  --  26  --  25  --  29  --  29  --   GLUCOSE 104*  --  121*  --  185*  --  161*  --  134*  --   BUN 27*  --  27*  --  47*  --  48*  --  51*  --   CREATININE 1.33*  --  1.22  --  1.85*  --  1.12  --  0.98  --   CALCIUM  7.5*  --  7.6*  --  7.6*  --  7.6*  --  7.9*  --   MG  --   --   --   --  2.6*  --  3.3*  --  3.1*  --   PHOS  --   --   --   --  7.1*  --  3.2  --  3.2  --    < > = values in this interval not displayed.    Liver Function Tests: Recent Labs  Lab 05/22/24 0451  AST 42*  ALT 25  ALKPHOS 92  BILITOT 0.4  PROT 5.9*  ALBUMIN  1.9*   No results for input(s): LIPASE, AMYLASE in the last 168 hours. No results for input(s): AMMONIA in the last 168 hours.  CBC: Recent Labs  Lab 05/18/24 0244 05/18/24 1444 05/19/24 0232 05/19/24 0821 05/20/24 0500 05/20/24 1019 05/21/24 0421 05/21/24 1424 05/22/24 0451 05/22/24 1314  WBC 14.5*  --  14.5*  --  16.2*  --  16.2*  --  19.7*  --   NEUTROABS  --   --  10.9*  --  13.3*  --  11.7*  --  13.5*  --   HGB 10.7*   < > 10.3*   < > 11.1* 10.5* 10.3* 10.2* 10.4* 10.5*  HCT 31.1*   < > 30.1*   < > 32.8* 31.0* 30.2* 30.0* 32.0* 31.0*  MCV 95.4  --  95.3  --  96.2  --  96.5  --  100.6*  --   PLT 192  --  174  --  148*  --  177  --  215  --    < > = values in this interval not displayed.    Cardiac Enzymes: No results for input(s): CKTOTAL, CKMB, CKMBINDEX, TROPONINI in the last 168 hours.  BNP: BNP (last 3 results) Recent Labs    05/30/23 1059 03/20/24 1419  BNP 34.3 20.6     ProBNP (last 3 results) No results for input(s): PROBNP in the last 8760 hours.   CBG: Recent Labs  Lab 05/21/24 1957 05/21/24 2344 05/22/24 0455 05/22/24 0749 05/22/24 1242  GLUCAP 177* 181* 132* 110* 113*    Coagulation Studies: No results for input(s): LABPROT, INR in the last 72 hours.   Imaging   DG CHEST PORT 1 VIEW Result Date: 05/22/2024 CLINICAL DATA:  Follow-up right pneumothorax and ARDS. EXAM: PORTABLE CHEST 1 VIEW COMPARISON:  05/21/2024. FINDINGS: Stable right chest tube with its tip near the right lung apex. Decreased size of the previously demonstrated moderate right pneumothorax. No mediastinal shift. Endotracheal tube in satisfactory position. Stable left PICC with its tip in the superior vena cava, approximately 6 cm above the superior cavoatrial junction. Nasogastric tube extending into the stomach. Mildly improved diffuse bilateral airspace opacity. No pleural fluid seen. Right shoulder prosthesis. IMPRESSION: 1. Decreased size of the previously demonstrated moderate right pneumothorax. 2. Mildly improved diffuse bilateral airspace opacity compatible with ARDS. 3. Stable left PICC with its tip in the superior vena cava, approximately 6 cm above the superior cavoatrial junction. Electronically Signed   By: Elspeth Bathe M.D.   On: 05/22/2024 12:58   DG Abd Portable 1V Result Date: 05/22/2024 EXAM: 1 VIEW XRAY OF THE ABDOMEN 05/22/2024 10:07:17 AM COMPARISON: 05/20/2024 CLINICAL HISTORY: feeding tube placement ,? Post pyloric; rover FINDINGS: LINES, TUBES AND DEVICES: Feeding tube in place with tip terminating over the distal stomach. BOWEL: Nonobstructive bowel gas pattern. SOFT TISSUES: No opaque urinary calculi. BONES: Lumbosacral spine fixation hardware and intervertebral disc spacers noted. IMPRESSION: 1. Feeding tube in place with the tip terminating over the distal stomach. Electronically signed by: Donnice Mania MD 05/22/2024 11:02 AM EDT RP  Workstation: [id number]     Medications:     Current Medications:  acetaminophen  (TYLENOL ) oral liquid 160 mg/5 mL  650 mg Per Tube Q6H   arformoterol  15 mcg Nebulization BID   artificial tears  1 Application Both Eyes Q8H   budesonide  (PULMICORT ) nebulizer solution  0.25 mg Nebulization BID   Chlorhexidine  Gluconate Cloth  6 each Topical Daily   dexamethasone  (DECADRON ) injection  20 mg Intravenous Q24H   feeding supplement (PROSource TF20)  60 mL Per Tube Daily   fentaNYL  (SUBLIMAZE ) injection  50 mcg Intravenous Once   furosemide  40 mg Intravenous Daily   gabapentin   600 mg Per Tube Daily   And   gabapentin   1,200 mg Per Tube QHS   insulin aspart  0-6 Units Subcutaneous Q4H   ipratropium-albuterol   3 mL Nebulization Q4H   mouth rinse  15 mL Mouth Rinse Q2H   pantoprazole  (PROTONIX ) IV  40 mg Intravenous Q24H   polyethylene glycol  17 g Per Tube Daily   QUEtiapine   100 mg Per Tube Daily   QUEtiapine   200 mg Per Tube QHS   revefenacin  175 mcg Nebulization Daily   senna-docusate  2 tablet Per Tube QHS   sodium chloride  flush  10-40 mL Intracatheter Q12H    Infusions:  ceFEPime (MAXIPIME) IV 200 mL/hr at 05/22/24 1414   cisatracurium (NIMBEX) 200 mg in sodium chloride  0.9 % 100 mL (2 mg/mL) infusion 3 mcg/kg/min (05/22/24 1414)   feeding supplement (VITAL 1.5 CAL) 40 mL/hr at 05/22/24 1414   fentaNYL  infusion INTRAVENOUS 175 mcg/hr (05/22/24 1414)   heparin  1,700 Units/hr (05/22/24 1414)   norepinephrine (LEVOPHED) Adult infusion Stopped (05/20/24 1221)   propofol  (DIPRIVAN ) infusion  70 mcg/kg/min (05/22/24 1414)   valproate sodium 500 mg (05/22/24 1430)   vasopressin  Stopped (05/21/24 0604)      Assessment/Plan   1. 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 ARDS 2. RUL lung CA s/p lobectomy 3. COPD with ongoing tobacco use 4. Bipolar d/o 5. CAD  Resp Score 2 = 57% in-hospital survival  I have reviewed the case with CCM and TCTS. He has  severe single organ disease unable to adequately oxygenate/ventilate in setting of ARDS and very poor lung compliance.   I am worried about his ability to recover and rehabilitate but consensus among team member is that given single organ dysfunction and functional premorbid state he will have a reasonable chance for recovery. Will proceed with VV ECMO cannulation.   CRITICAL CARE Performed by: Cherrie Sieving  Total critical care time: 65 minutes  Critical care time was exclusive of separately billable procedures and treating other patients.  Critical care was necessary to treat or prevent imminent or life-threatening deterioration.  Critical care was time spent personally by me (independent of midlevel providers or residents) on the following activities: development of treatment plan with patient and/or surrogate as well as nursing, discussions with consultants, evaluation of patient's response to treatment, examination of patient, obtaining history from patient or surrogate, ordering and performing treatments and interventions, ordering and review of laboratory studies, ordering and review of radiographic studies, pulse oximetry and re-evaluation of patient's condition.  Length of Stay: 13  Sieving Cherrie, MD  05/22/2024, 3:23 PM    Advanced Heart Failure Team Pager [phone] (M-F; 7a - 5p)  Please contact [facility] for night-coverage after hours (4p -7a ) and weekends on [email or web address]

## 2024-05-23 ENCOUNTER — Inpatient Hospital Stay (HOSPITAL_COMMUNITY): Payer: MEDICAID

## 2024-05-23 ENCOUNTER — Encounter (HOSPITAL_COMMUNITY): Payer: Self-pay | Admitting: Internal Medicine

## 2024-05-23 ENCOUNTER — Other Ambulatory Visit: Payer: Self-pay

## 2024-05-23 DIAGNOSIS — J9621 Acute and chronic respiratory failure with hypoxia: Secondary | ICD-10-CM | POA: Diagnosis not present

## 2024-05-23 DIAGNOSIS — J80 Acute respiratory distress syndrome: Secondary | ICD-10-CM | POA: Diagnosis not present

## 2024-05-23 DIAGNOSIS — C3412 Malignant neoplasm of upper lobe, left bronchus or lung: Secondary | ICD-10-CM

## 2024-05-23 DIAGNOSIS — B965 Pseudomonas (aeruginosa) (mallei) (pseudomallei) as the cause of diseases classified elsewhere: Secondary | ICD-10-CM

## 2024-05-23 DIAGNOSIS — J441 Chronic obstructive pulmonary disease with (acute) exacerbation: Secondary | ICD-10-CM | POA: Diagnosis not present

## 2024-05-23 DIAGNOSIS — Z9281 Personal history of extracorporeal membrane oxygenation (ECMO): Secondary | ICD-10-CM

## 2024-05-23 DIAGNOSIS — A498 Other bacterial infections of unspecified site: Secondary | ICD-10-CM

## 2024-05-23 DIAGNOSIS — J95851 Ventilator associated pneumonia: Secondary | ICD-10-CM

## 2024-05-23 DIAGNOSIS — Z902 Acquired absence of lung [part of]: Secondary | ICD-10-CM

## 2024-05-23 DIAGNOSIS — J9601 Acute respiratory failure with hypoxia: Secondary | ICD-10-CM

## 2024-05-23 DIAGNOSIS — B377 Candidal sepsis: Secondary | ICD-10-CM

## 2024-05-23 DIAGNOSIS — Z9889 Other specified postprocedural states: Secondary | ICD-10-CM | POA: Diagnosis not present

## 2024-05-23 DIAGNOSIS — A419 Sepsis, unspecified organism: Secondary | ICD-10-CM | POA: Diagnosis not present

## 2024-05-23 DIAGNOSIS — J9622 Acute and chronic respiratory failure with hypercapnia: Secondary | ICD-10-CM | POA: Diagnosis not present

## 2024-05-23 DIAGNOSIS — B371 Pulmonary candidiasis: Secondary | ICD-10-CM

## 2024-05-23 DIAGNOSIS — T80219A Unspecified infection due to central venous catheter, initial encounter: Secondary | ICD-10-CM

## 2024-05-23 LAB — POCT I-STAT 7, (LYTES, BLD GAS, ICA,H+H)
Acid-Base Excess: 7 mmol/L — ABNORMAL HIGH (ref 0.0–2.0)
Acid-Base Excess: 6 mmol/L — ABNORMAL HIGH (ref 0.0–2.0)
Acid-Base Excess: 6 mmol/L — ABNORMAL HIGH (ref 0.0–2.0)
Acid-Base Excess: 4 mmol/L — ABNORMAL HIGH (ref 0.0–2.0)
Bicarbonate: 32.3 mmol/L — ABNORMAL HIGH (ref 20.0–28.0)
Bicarbonate: 31.3 mmol/L — ABNORMAL HIGH (ref 20.0–28.0)
Bicarbonate: 31.4 mmol/L — ABNORMAL HIGH (ref 20.0–28.0)
Bicarbonate: 29.2 mmol/L — ABNORMAL HIGH (ref 20.0–28.0)
Calcium, Ion: 1.13 mmol/L — ABNORMAL LOW (ref 1.15–1.40)
Calcium, Ion: 1.16 mmol/L (ref 1.15–1.40)
Calcium, Ion: 1.15 mmol/L (ref 1.15–1.40)
Calcium, Ion: 1.12 mmol/L — ABNORMAL LOW (ref 1.15–1.40)
HCT: 25 % — ABNORMAL LOW (ref 39.0–52.0)
HCT: 26 % — ABNORMAL LOW (ref 39.0–52.0)
HCT: 25 % — ABNORMAL LOW (ref 39.0–52.0)
HCT: 24 % — ABNORMAL LOW (ref 39.0–52.0)
Hemoglobin: 8.5 g/dL — ABNORMAL LOW (ref 13.0–17.0)
Hemoglobin: 8.8 g/dL — ABNORMAL LOW (ref 13.0–17.0)
Hemoglobin: 8.5 g/dL — ABNORMAL LOW (ref 13.0–17.0)
Hemoglobin: 8.2 g/dL — ABNORMAL LOW (ref 13.0–17.0)
O2 Saturation: 93 %
O2 Saturation: 92 %
O2 Saturation: 89 %
O2 Saturation: 90 %
Patient temperature: 37.1
Patient temperature: 37.1
Patient temperature: 37.1
Patient temperature: 37.1
Potassium: 5.2 mmol/L — ABNORMAL HIGH (ref 3.5–5.1)
Potassium: 4.6 mmol/L (ref 3.5–5.1)
Potassium: 5 mmol/L (ref 3.5–5.1)
Potassium: 4.8 mmol/L (ref 3.5–5.1)
Sodium: 145 mmol/L (ref 135–145)
Sodium: 144 mmol/L (ref 135–145)
Sodium: 145 mmol/L (ref 135–145)
Sodium: 145 mmol/L (ref 135–145)
TCO2: 34 mmol/L — ABNORMAL HIGH (ref 22–32)
TCO2: 33 mmol/L — ABNORMAL HIGH (ref 22–32)
TCO2: 33 mmol/L — ABNORMAL HIGH (ref 22–32)
TCO2: 31 mmol/L (ref 22–32)
pCO2 arterial: 51.9 mmHg — ABNORMAL HIGH (ref 32–48)
pCO2 arterial: 47 mmHg (ref 32–48)
pCO2 arterial: 48.6 mmHg — ABNORMAL HIGH (ref 32–48)
pCO2 arterial: 45 mmHg (ref 32–48)
pH, Arterial: 7.402 (ref 7.35–7.45)
pH, Arterial: 7.432 (ref 7.35–7.45)
pH, Arterial: 7.419 (ref 7.35–7.45)
pH, Arterial: 7.421 (ref 7.35–7.45)
pO2, Arterial: 68 mmHg — ABNORMAL LOW (ref 83–108)
pO2, Arterial: 64 mmHg — ABNORMAL LOW (ref 83–108)
pO2, Arterial: 57 mmHg — ABNORMAL LOW (ref 83–108)
pO2, Arterial: 59 mmHg — ABNORMAL LOW (ref 83–108)

## 2024-05-23 LAB — HEPATIC FUNCTION PANEL
ALT: 23 U/L (ref 0–44)
AST: 31 U/L (ref 15–41)
Albumin: 1.8 g/dL — ABNORMAL LOW (ref 3.5–5.0)
Alkaline Phosphatase: 79 U/L (ref 38–126)
Bilirubin, Direct: 0.1 mg/dL (ref 0.0–0.2)
Indirect Bilirubin: 0.4 mg/dL (ref 0.3–0.9)
Total Bilirubin: 0.5 mg/dL (ref 0.0–1.2)
Total Protein: 5.2 g/dL — ABNORMAL LOW (ref 6.5–8.1)

## 2024-05-23 LAB — BLOOD CULTURE ID PANEL (REFLEXED) - BCID2

## 2024-05-23 LAB — BASIC METABOLIC PANEL WITH GFR
Anion gap: 11 (ref 5–15)
Anion gap: 10 (ref 5–15)
BUN: 46 mg/dL — ABNORMAL HIGH (ref 8–23)
BUN: 50 mg/dL — ABNORMAL HIGH (ref 8–23)
CO2: 29 mmol/L (ref 22–32)
CO2: 29 mmol/L (ref 22–32)
Calcium: 7.5 mg/dL — ABNORMAL LOW (ref 8.9–10.3)
Calcium: 7.8 mg/dL — ABNORMAL LOW (ref 8.9–10.3)
Chloride: 106 mmol/L (ref 98–111)
Chloride: 106 mmol/L (ref 98–111)
Creatinine, Ser: 0.88 mg/dL (ref 0.61–1.24)
Creatinine, Ser: 1 mg/dL (ref 0.61–1.24)
GFR, Estimated: >60 mL/min (ref >60)
GFR, Estimated: >60 mL/min (ref >60)
Glucose, Bld: 113 mg/dL — ABNORMAL HIGH (ref 70–99)
Glucose, Bld: 160 mg/dL — ABNORMAL HIGH (ref 70–99)
Potassium: 5.3 mmol/L — ABNORMAL HIGH (ref 3.5–5.1)
Potassium: 5 mmol/L (ref 3.5–5.1)
Sodium: 146 mmol/L — ABNORMAL HIGH (ref 135–145)
Sodium: 145 mmol/L (ref 135–145)

## 2024-05-23 LAB — CBC
HCT: 28.4 % — ABNORMAL LOW (ref 39.0–52.0)
Hemoglobin: 8.9 g/dL — ABNORMAL LOW (ref 13.0–17.0)
MCH: 32.4 pg (ref 26.0–34.0)
MCHC: 31.3 g/dL (ref 30.0–36.0)
MCV: 103.3 fL — ABNORMAL HIGH (ref 80.0–100.0)
Platelets: 223 K/uL (ref 150–400)
RBC: 2.75 MIL/uL — ABNORMAL LOW (ref 4.22–5.81)
RDW: 14.9 % (ref 11.5–15.5)
WBC: 18.9 K/uL — ABNORMAL HIGH (ref 4.0–10.5)
nRBC: 0.2 % (ref 0.0–0.2)

## 2024-05-23 LAB — CBC WITH DIFFERENTIAL/PLATELET
Abs Immature Granulocytes: 3.03 K/uL — ABNORMAL HIGH (ref 0.00–0.07)
Basophils Absolute: 0 K/uL (ref 0.0–0.1)
Basophils Relative: 0 %
Eosinophils Absolute: 0.1 K/uL (ref 0.0–0.5)
Eosinophils Relative: 1 %
HCT: 28 % — ABNORMAL LOW (ref 39.0–52.0)
Hemoglobin: 8.9 g/dL — ABNORMAL LOW (ref 13.0–17.0)
Immature Granulocytes: 11 %
Lymphocytes Relative: 10 %
Lymphs Abs: 2.6 K/uL (ref 0.7–4.0)
MCH: 32.5 pg (ref 26.0–34.0)
MCHC: 31.8 g/dL (ref 30.0–36.0)
MCV: 102.2 fL — ABNORMAL HIGH (ref 80.0–100.0)
Monocytes Absolute: 2.3 K/uL — ABNORMAL HIGH (ref 0.1–1.0)
Monocytes Relative: 9 %
Neutro Abs: 18.6 K/uL — ABNORMAL HIGH (ref 1.7–7.7)
Neutrophils Relative %: 69 %
Platelets: 263 K/uL (ref 150–400)
RBC: 2.74 MIL/uL — ABNORMAL LOW (ref 4.22–5.81)
RDW: 14.8 % (ref 11.5–15.5)
Smear Review: NORMAL
WBC: 26.7 K/uL — ABNORMAL HIGH (ref 4.0–10.5)
nRBC: 0.3 % — ABNORMAL HIGH (ref 0.0–0.2)

## 2024-05-23 LAB — PNEUMOCYSTIS JIROVECI SMEAR BY DFA

## 2024-05-23 LAB — GLUCOSE, CAPILLARY
Glucose-Capillary: 117 mg/dL — ABNORMAL HIGH (ref 70–99)
Glucose-Capillary: 122 mg/dL — ABNORMAL HIGH (ref 70–99)
Glucose-Capillary: 145 mg/dL — ABNORMAL HIGH (ref 70–99)
Glucose-Capillary: 75 mg/dL (ref 70–99)
Glucose-Capillary: 92 mg/dL (ref 70–99)
Glucose-Capillary: 97 mg/dL (ref 70–99)

## 2024-05-23 LAB — PROTIME-INR
INR: 1.1 (ref 0.8–1.2)
Prothrombin Time: 15.3 s — ABNORMAL HIGH (ref 11.4–15.2)

## 2024-05-23 LAB — APTT
aPTT: 58 s — ABNORMAL HIGH (ref 24–36)
aPTT: 35 s (ref 24–36)
aPTT: 36 s (ref 24–36)

## 2024-05-23 LAB — FIBRINOGEN: Fibrinogen: 551 mg/dL — ABNORMAL HIGH (ref 210–475)

## 2024-05-23 LAB — CG4 I-STAT (LACTIC ACID)
Lactic Acid, Venous: 1.1 mmol/L (ref 0.5–1.9)
Lactic Acid, Venous: 1.3 mmol/L (ref 0.5–1.9)

## 2024-05-23 LAB — TRIGLYCERIDES: Triglycerides: 97 mg/dL (ref <150)

## 2024-05-23 LAB — LACTATE DEHYDROGENASE: LDH: 478 U/L — ABNORMAL HIGH (ref 98–192)

## 2024-05-23 LAB — MAGNESIUM: Magnesium: 3.1 mg/dL — ABNORMAL HIGH (ref 1.7–2.4)

## 2024-05-23 LAB — PHOSPHORUS: Phosphorus: 3.1 mg/dL (ref 2.5–4.6)

## 2024-05-23 LAB — HEPARIN LEVEL (UNFRACTIONATED): Heparin Unfractionated: 0.33 [IU]/mL (ref 0.30–0.70)

## 2024-05-23 MED ORDER — SODIUM CHLORIDE 0.9 % IV SOLN
0.0000 ug/kg/min | INTRAVENOUS | Status: DC
  Administered 2024-05-23 – 2024-05-25 (×5): 3 ug/kg/min via INTRAVENOUS
  Administered 2024-05-25: 2 ug/kg/min via INTRAVENOUS
  Administered 2024-05-26 (×2): 3 ug/kg/min via INTRAVENOUS
  Administered 2024-05-26: 2 ug/kg/min via INTRAVENOUS
  Administered 2024-05-27 (×2): 3 ug/kg/min via INTRAVENOUS
  Filled 2024-05-23 (×11): qty 20

## 2024-05-23 MED ORDER — HYDROMORPHONE HCL-NACL 50-0.9 MG/50ML-% IV SOLN: 0.5000 mg/h | INTRAVENOUS | Status: DC

## 2024-05-23 MED ORDER — HYDROMORPHONE HCL 1 MG/ML IJ SOLN: 1.0000 mg | Freq: Once | INTRAMUSCULAR | Status: DC

## 2024-05-23 MED ORDER — HYDROMORPHONE BOLUS VIA INFUSION
0.2500 mg | INTRAVENOUS | Status: DC | PRN
  Administered 2024-05-23: 0.5 mg via INTRAVENOUS
  Administered 2024-05-23 – 2024-05-24 (×2): 1 mg via INTRAVENOUS
  Administered 2024-05-24 – 2024-05-27 (×13): 2 mg via INTRAVENOUS
  Administered 2024-05-28 (×2): 1.5 mg via INTRAVENOUS
  Administered 2024-05-29: 2 mg via INTRAVENOUS

## 2024-05-23 MED ORDER — CISATRACURIUM BOLUS VIA INFUSION
0.1000 mg/kg | Freq: Once | INTRAVENOUS | Status: AC
  Administered 2024-05-23: 10.9 mg via INTRAVENOUS
  Filled 2024-05-23: qty 11

## 2024-05-23 MED ORDER — ROCURONIUM BROMIDE 10 MG/ML (PF) SYRINGE
PREFILLED_SYRINGE | INTRAVENOUS | Status: AC
  Administered 2024-05-23: 100 mg via INTRAVENOUS
  Filled 2024-05-23: qty 10

## 2024-05-23 MED ORDER — SODIUM CHLORIDE 0.9 % IV SOLN
0.1600 mg/kg/h | INTRAVENOUS | Status: AC
  Administered 2024-05-23: 0.02 mg/kg/h via INTRAVENOUS
  Administered 2024-05-24: 0.14 mg/kg/h via INTRAVENOUS
  Administered 2024-05-25 – 2024-05-26 (×2): 0.15 mg/kg/h via INTRAVENOUS
  Administered 2024-05-26: 0.16 mg/kg/h via INTRAVENOUS
  Filled 2024-05-23 (×5): qty 250

## 2024-05-23 MED ORDER — MIDAZOLAM-SODIUM CHLORIDE 100-0.9 MG/100ML-% IV SOLN
2.0000 mg/h | INTRAVENOUS | Status: DC
  Administered 2024-05-23: 14 mg/h via INTRAVENOUS
  Administered 2024-05-23: 11 mg/h via INTRAVENOUS
  Administered 2024-05-23: 14 mg/h via INTRAVENOUS
  Administered 2024-05-24: 15 mg/h via INTRAVENOUS
  Administered 2024-05-24 (×2): 14 mg/h via INTRAVENOUS
  Administered 2024-05-25 (×4): 15 mg/h via INTRAVENOUS
  Administered 2024-05-25: 17 mg/h via INTRAVENOUS
  Administered 2024-05-26: 20 mg/h via INTRAVENOUS
  Administered 2024-05-26 (×2): 15 mg/h via INTRAVENOUS
  Administered 2024-05-26: 20 mg/h via INTRAVENOUS
  Administered 2024-05-27: 15 mg/h via INTRAVENOUS
  Administered 2024-05-27: 16 mg/h via INTRAVENOUS
  Administered 2024-05-27: 20 mg/h via INTRAVENOUS
  Administered 2024-05-27: 16 mg/h via INTRAVENOUS
  Administered 2024-05-28 (×3): 20 mg/h via INTRAVENOUS
  Administered 2024-05-28: 19 mg/h via INTRAVENOUS
  Administered 2024-05-28: 15 mg/h via INTRAVENOUS
  Administered 2024-05-29: 17 mg/h via INTRAVENOUS
  Administered 2024-05-29: 5 mg/h via INTRAVENOUS
  Administered 2024-05-29: 15 mg/h via INTRAVENOUS
  Administered 2024-05-30: 7 mg/h via INTRAVENOUS
  Administered 2024-05-30: 8 mg/h via INTRAVENOUS
  Administered 2024-05-31: 7 mg/h via INTRAVENOUS
  Administered 2024-06-01: 5 mg/h via INTRAVENOUS
  Administered 2024-06-02: 3 mg/h via INTRAVENOUS
  Filled 2024-05-23 (×34): qty 100

## 2024-05-23 MED ORDER — HYDROMORPHONE HCL-NACL 50-0.9 MG/50ML-% IV SOLN: 1.0000 mg/h | INTRAVENOUS | Status: DC

## 2024-05-23 MED ORDER — ARTIFICIAL TEARS OPHTHALMIC OINT
1.0000 | TOPICAL_OINTMENT | Freq: Three times a day (TID) | OPHTHALMIC | Status: DC
  Administered 2024-05-23 – 2024-06-01 (×29): 1 via OPHTHALMIC
  Filled 2024-05-23 (×8): qty 3.5

## 2024-05-23 MED ORDER — ORAL CARE MOUTH RINSE: 15.0000 mL | OROMUCOSAL | Status: DC | PRN

## 2024-05-23 MED ORDER — MIDAZOLAM BOLUS VIA INFUSION
1.0000 mg | INTRAVENOUS | Status: DC | PRN
  Administered 2024-05-23 (×2): 2 mg via INTRAVENOUS

## 2024-05-23 MED ORDER — ROCURONIUM BROMIDE 10 MG/ML (PF) SYRINGE
100.0000 mg | PREFILLED_SYRINGE | INTRAVENOUS | Status: DC | PRN
  Administered 2024-05-23: 100 mg via INTRAVENOUS
  Filled 2024-05-23 (×2): qty 10

## 2024-05-23 MED ORDER — HYDROMORPHONE BOLUS VIA INFUSION
0.5000 mg | INTRAVENOUS | Status: DC | PRN
  Administered 2024-05-23 (×3): 0.5 mg via INTRAVENOUS

## 2024-05-23 MED ORDER — HYDROMORPHONE BOLUS VIA INFUSION
0.2500 mg | INTRAVENOUS | Status: DC | PRN
  Administered 2024-05-23: 0.5 mg via INTRAVENOUS
  Administered 2024-05-23: 0.25 mg via INTRAVENOUS
  Administered 2024-05-23: 1 mg via INTRAVENOUS

## 2024-05-23 MED ORDER — HYDROMORPHONE HCL 1 MG/ML IJ SOLN
1.0000 mg | Freq: Once | INTRAMUSCULAR | Status: AC
  Administered 2024-05-23: 1 mg via INTRAVENOUS
  Filled 2024-05-23: qty 1

## 2024-05-23 MED ORDER — LACTULOSE 10 GM/15ML PO SOLN
10.0000 g | Freq: Three times a day (TID) | ORAL | Status: DC
Start: 2024-05-23 — End: 2024-05-24
  Administered 2024-05-23 – 2024-05-24 (×4): 10 g
  Filled 2024-05-23 (×4): qty 15

## 2024-05-23 MED ORDER — MIDAZOLAM BOLUS VIA INFUSION
0.0000 mg | INTRAVENOUS | Status: DC | PRN
  Administered 2024-05-23 – 2024-05-24 (×3): 2 mg via INTRAVENOUS
  Administered 2024-05-24: 5 mg via INTRAVENOUS
  Administered 2024-05-24: 4 mg via INTRAVENOUS
  Administered 2024-05-24: 5 mg via INTRAVENOUS
  Administered 2024-05-25: 4 mg via INTRAVENOUS

## 2024-05-23 MED ORDER — PROSOURCE TF20 ENFIT COMPATIBL EN LIQD
60.0000 mL | Freq: Four times a day (QID) | ENTERAL | Status: DC
  Administered 2024-05-23 – 2024-06-04 (×47): 60 mL
  Filled 2024-05-23 (×46): qty 60

## 2024-05-23 MED ORDER — ORAL CARE MOUTH RINSE
15.0000 mL | OROMUCOSAL | Status: DC
  Administered 2024-05-23 – 2024-06-04 (×145): 15 mL via OROMUCOSAL

## 2024-05-23 MED ORDER — SODIUM CHLORIDE 0.9 % IV SOLN
0.5000 mg/h | INTRAVENOUS | Status: DC
  Administered 2024-05-23: 0.5 mg/h via INTRAVENOUS
  Administered 2024-05-24: 2 mg/h via INTRAVENOUS
  Administered 2024-05-24: 3 mg/h via INTRAVENOUS
  Administered 2024-05-25 – 2024-05-26 (×4): 4 mg/h via INTRAVENOUS
  Administered 2024-05-27 (×2): 4.5 mg/h via INTRAVENOUS
  Administered 2024-05-28: 5 mg/h via INTRAVENOUS
  Administered 2024-05-28: 4.5 mg/h via INTRAVENOUS
  Administered 2024-05-29 – 2024-06-02 (×12): 5 mg/h via INTRAVENOUS
  Filled 2024-05-23 (×29): qty 5

## 2024-05-23 NOTE — Progress Notes (Signed)
 EVENING ROUNDS NOTE :     [address]       [place] [address]             [phone]                 1 Day Post-Op Procedure(s) (LRB): ECMO CANNULATION (N/A)   Total Length of Stay:  LOS: 14 days  Events:   No events    BP (!) 111/59   Pulse (!) 53   Temp 98.8 F (37.1 C)   Resp (!) 61   Ht 6' 3 (1.905 m)   Wt 109.3 kg   SpO2 94%   BMI 30.12 kg/m   CVP:  [3 mmHg-16 mmHg] 5 mmHg  Vent Mode: PCV FiO2 (%):  [60 %] 60 % Set Rate:  [15 bmp] 15 bmp PEEP:  [10 cmH20] 10 cmH20 Plateau Pressure:  [16 cmH20-23 cmH20] 23 cmH20   sodium chloride  Stopped (05/23/24 9357)   albumin  human     bivalirudin  (ANGIOMAX ) 250 mg in sodium chloride  0.9 % 500 mL (0.5 mg/mL) infusion 0.04 mg/kg/hr (05/23/24 2016)   cisatracurium (NIMBEX) 200 mg in sodium chloride  0.9 % 100 mL (2 mg/mL) infusion 3 mcg/kg/min (05/23/24 1900)   feeding supplement (VITAL 1.5 CAL) 20 mL/hr at 05/23/24 1900   HYDROmorphone  1.5 mg/hr (05/23/24 1900)   meropenem (MERREM) IV Stopped (05/23/24 1439)   micafungin (MYCAMINE) 200 mg in sodium chloride  0.9 % 100 mL IVPB Stopped (05/23/24 1806)   midazolam  14 mg/hr (05/23/24 1900)   norepinephrine (LEVOPHED) Adult infusion 4 mcg/min (05/23/24 1900)    I/O last 3 completed shifts: In: 5540.1 [I.V.:2033.6; NG/GT:1886.7; IV Piggyback:1619.8] Out: 5150 [Urine:4970; Chest Tube:180]      Latest Ref Rng & Units 05/23/2024    8:15 PM 05/23/2024    5:24 PM 05/23/2024    5:17 PM  CBC  WBC 4.0 - 10.5 K/uL   18.9   Hemoglobin 13.0 - 17.0 g/dL 8.2  8.5  8.9   Hematocrit 39.0 - 52.0 % 24.0  25.0  28.4   Platelets 150 - 400 K/uL   223        Latest Ref Rng & Units 05/23/2024    8:15 PM 05/23/2024    5:24 PM 05/23/2024    5:17 PM  BMP  Glucose 70 - 99 mg/dL   839   BUN 8 - 23 mg/dL   50   Creatinine 9.38 - 1.24 mg/dL   8.99   Sodium 864 - 854 mmol/L 145  145  145   Potassium 3.5 - 5.1 mmol/L 4.8  5.0  5.0   Chloride 98 - 111 mmol/L   106    CO2 22 - 32 mmol/L   29   Calcium  8.9 - 10.3 mg/dL   7.8     ABG    Component Value Date/Time   PHART 7.421 05/23/2024 2015   PCO2ART 45.0 05/23/2024 2015   PO2ART 59 (L) 05/23/2024 2015   HCO3 29.2 (H) 05/23/2024 2015   TCO2 31 05/23/2024 2015   ACIDBASEDEF 1.0 05/19/2024 1154   O2SAT 90 05/23/2024 2015       Linnie Rayas, MD 05/23/2024 9:21 PM

## 2024-05-23 NOTE — Consult Note (Signed)
 [facility]    Date of Admission:  05/09/2024      Total days of antibiotics 6  Vancomycin  10/25  Meropenem 10/26   Micafungin 10/29           Reason for Consult: Fungemia     Referring Provider: [employer]  Primary Care Provider: Lorren Greig PARAS, NP   Assessment: Keith Gibbs is a 63 y.o. male admitted with:    Fever -  Fungemia Candida Glabratta -  BCx from 10/26 (+) with candida growing; notified today with auto-alert. Suspect this is the leading contributor of his fever and leukocytosis of late. He was started on micafungin already yesterday for candida in the sputum culture. Likely line infection given multiple lines, broad spectrum antibiotics and prolonged hospital stay. He is not eligible for line removal d/t hemodynamic instability. Will try to treat through with IV micafungin - needs higher dose with ECMO circuit.  - Continue micafungin  - Would benefit from echocardiogram if we re-isolate fungus.   - repeat blood cultures in AM (would have had 2 doses in)  - Nothing urgent with regards to optho eval - would defer until trajectory of illness is better declared   ARDS on VV ECMO S/P RUL Lobectomy -  Bronchogenic Ca -  Pseudomonas VAP -  Keith Gibbs was initially admitted to the hospital 10/16 for RU lobectomy. Complicated by worsening ARDS/respiratory failure and now escalated to intubation + VV ECMO with plans to trach him Monday. He has pseudomonas aeruginosa pneumonia (sensitive to all active abx).  Meropenem has been chosen by ECMO team due to the less disruptive PK effect vs cefepime with regards to sequestration in circuit. He is on day 6 of treatment - 7 - 10 day course likely will be adequate for him.  - I would advocate that his vancomycin  can be discontinued given negative MRSA PCR and no evidence of MRSA pneumonia or bacteremia.   H/O Hepatitis C -  Chart reviewed indicates he was treated in the past prior to 2021.  Not  a priority at the movement to investigate this but could check RNA.   Substance Abuse -  Cocaine abuse. I don't see any history of documented IV drug use    Plan: Would recommend stopping vancomycin  (negative MRSA PCR, no evidence in blood or sputum).  Continue micafungin higher dose needed  Continue meropenem - would re-evaluate need for further therapy by day 10  Of course lines are stuck for now. Won't be able to clear this until lines are out ultimately.  Would benefit from echocardiogram if we re-isolate fungus.    Principal Problem:   S/P lobectomy of lung Active Problems:   Status post robot-assisted surgical procedure   Acute hypoxic respiratory failure (HCC)   Anxiety disorder due to medical condition   Malnutrition of moderate degree   Acute respiratory distress syndrome (ARDS) due to COVID-19 virus (HCC)    arformoterol  15 mcg Nebulization BID   artificial tears  1 Application Both Eyes Q8H   budesonide  (PULMICORT ) nebulizer solution  0.25 mg Nebulization BID   Chlorhexidine  Gluconate Cloth  6 each Topical Daily   feeding supplement (PROSource TF20)  60 mL Per Tube Daily   furosemide  40 mg Intravenous Daily   gabapentin   600 mg Per Tube Daily   And   gabapentin   1,200 mg Per Tube QHS   insulin aspart  0-15 Units Subcutaneous Q4H  ipratropium-albuterol   3 mL Nebulization Q4H   lactulose  10 g Per Tube TID   pantoprazole  (PROTONIX ) IV  40 mg Intravenous QHS   polyethylene glycol  17 g Per Tube Daily   QUEtiapine   100 mg Per Tube Daily   QUEtiapine   200 mg Per Tube QHS   revefenacin  175 mcg Nebulization Daily   senna-docusate  2 tablet Per Tube QHS   sodium chloride  flush  10-40 mL Intracatheter Q12H    HPI: Keith Gibbs is a 63 y.o. male   Admitted for RUL lobectomy 10/16 with h/o bronchogenic carcinoma. Required transfer to ICU on POD5. Ultimately intubated 10/26. Started on iNO on 10/29 (not helping ARDS); decision to prone initiate. Given the patient  was not able to be ventilated decision was made to place on VV ECMO. Throughout this time his antibiotics have escalated from vancomycin  + piperacillin tazobactam to vancomycin  + meropenem + micafungin (started yesterday).   He had fevers that started on 10/26 with increasing leukocytosis (today 26.7K) that prompted blood cultures to be drawn. These have grown out candida glabratta. He has received one dose of Micafungin 200 mg.   Review of Systems: Review of Systems  Unable to perform ROS: Medical condition    Past Medical History:  Diagnosis Date   Anginal pain    Anxiety    Arthritis    Bipolar 1 disorder (HCC)    Bursitis    CAD (coronary artery disease)    Stent placed 2017   Cancer (HCC)    Chronic pain    COPD (chronic obstructive pulmonary disease) (HCC)    Current use of long term anticoagulation    DAPT (ASA + clopidogrel )   Depression    Diverticulitis    Dyspnea    GERD (gastroesophageal reflux disease)    Grade I diastolic dysfunction    Hepatitis C 2012   No longer has Hep C   HLD (hyperlipidemia)    Hypertension    MI (myocardial infarction) (HCC)    Polysubstance abuse (HCC)    cocaine, marijuana, ETOH   PUD (peptic ulcer disease)    S/P angioplasty with stent 06/10/2016   a.) 90% stenosis of pLAD to mLAD - 2.5 x 18 mm Xience Alpine (DES x 1) placed to pLAD   S/P PTCA (percutaneous transluminal coronary angioplasty) 12/04/2019   a.) 60% in stent restenosis of DES to pLAD; LVEF 65%.   Schizophrenia (HCC)    Stroke (HCC)    Tremors    generalized   Valvular insufficiency    a.) Mild MR, TR, PR; mild to moderate AR on 03/05/2018 TTE    Social History   Tobacco Use   Smoking status: Former    Current packs/day: 1.50    Average packs/day: 1.5 packs/day for 42.6 years (63.9 ttl pk-yrs)    Types: Cigarettes    Start date: 10/26/1978    Quit date: 2000    Passive exposure: Current   Smokeless tobacco: Never   Tobacco comments:    Smokes 0.5 PPD - khj  04/17/2024        Started smoking at 63 yrs old    Smoked 1.5 PPD at his heaviest        States he quit 04/17/24  Vaping Use   Vaping status: Never Used  Substance Use Topics   Alcohol  use: Not Currently    Alcohol /week: 3.0 standard drinks of alcohol     Types: 3 Cans of beer per week  Comment: occassionally    Drug use: Not Currently    Types: Cocaine, Marijuana    Comment: 3 months ago per pt (05/07/2024)    Family History  Problem Relation Age of Onset   Osteoarthritis Mother    Heart disease Mother    Hypertension Mother    Depression Mother    Heart disease Father    Early death Father    Hypertension Father    Heart attack Father    Hypertension Sister    Parkinson's disease Maternal Grandfather    Prostate cancer Neg Hx    Bladder Cancer Neg Hx    Kidney cancer Neg Hx    Tremor Neg Hx    Allergies  Allergen Reactions   Asenapine Other (See Comments) and Nausea And Vomiting    Increased tremors   Dextromethorphan Hbr    Guaifenesin     Latuda [Lurasidone Hcl] Other (See Comments)    Tremors     Lurasidone Other (See Comments) and Hives    Other Reaction(s): Angioedema   Phenylephrine      OBJECTIVE: Blood pressure 110/63, pulse 67, temperature 98.6 F (37 C), resp. rate (!) 66, height 6' 3 (1.905 m), weight 109.3 kg, SpO2 95%.  Physical Exam Vitals reviewed.  Constitutional:      Appearance: Normal appearance. He is not ill-appearing.  HENT:     Head: Normocephalic.     Mouth/Throat:     Mouth: Mucous membranes are moist.     Pharynx: Oropharynx is clear.  Eyes:     General: No scleral icterus. Neck:     Comments: VV ECMO cannula Rt neck, CVC Lt neck  Cardiovascular:     Rate and Rhythm: Normal rate and regular rhythm.  Pulmonary:     Effort: Pulmonary effort is normal.     Comments: T sided CTs  Abdominal:     General: Bowel sounds are normal. There is no distension.  Musculoskeletal:        General: Normal range of motion.      Cervical back: Normal range of motion.  Skin:    Coloration: Skin is not jaundiced or pale.  Neurological:     Mental Status: He is alert.     Comments: Sedated heavily      Lab Results Lab Results  Component Value Date   WBC 26.7 (H) 05/23/2024   HGB 8.8 (L) 05/23/2024   HCT 26.0 (L) 05/23/2024   MCV 102.2 (H) 05/23/2024   PLT 263 05/23/2024    Lab Results  Component Value Date   CREATININE 0.88 05/23/2024   BUN 46 (H) 05/23/2024   NA 144 05/23/2024   K 4.6 05/23/2024   CL 106 05/23/2024   CO2 29 05/23/2024    Lab Results  Component Value Date   ALT 23 05/23/2024   AST 31 05/23/2024   ALKPHOS 79 05/23/2024   BILITOT 0.5 05/23/2024     Microbiology: Recent Results (from the past 240 hours)  MRSA Next Gen by PCR, Nasal     Status: None   Collection Time: 05/14/24  3:03 AM   Specimen: Nasal Mucosa; Nasal Swab  Result Value Ref Range Status   MRSA by PCR Next Gen NOT DETECTED NOT DETECTED Final    Comment: (NOTE) The GeneXpert MRSA Assay (FDA approved for NASAL specimens only), is one component of a comprehensive MRSA colonization surveillance program. It is not intended to diagnose MRSA infection nor to guide or monitor treatment for MRSA infections. Test performance is not  FDA approved in patients less than 76 years old. Performed at [facility], [address], [place], [place] [address]   Culture, Respiratory w Gram Stain     Status: None   Collection Time: 05/19/24 12:23 PM   Specimen: Bronchoalveolar Lavage; Respiratory  Result Value Ref Range Status   Specimen Description BRONCHIAL ALVEOLAR LAVAGE  Final   Special Requests NONE  Final   Gram Stain   Final    NO WBC SEEN ABUNDANT YEAST WITH PSEUDOHYPHAE Performed at [facility], [address] [place], [place] [address]    Culture   Final    RARE PSEUDOMONAS AERUGINOSA MODERATE CANDIDA GLABRATA FEW CANDIDA TROPICALIS    Report Status 05/23/2024 FINAL  Final   Organism ID, Bacteria  PSEUDOMONAS AERUGINOSA  Final      Susceptibility   Pseudomonas aeruginosa - MIC*    MEROPENEM 1 SENSITIVE Sensitive     CIPROFLOXACIN  0.25 SENSITIVE Sensitive     IMIPENEM 1 SENSITIVE Sensitive     CEFEPIME 2 SENSITIVE Sensitive     CEFTAZIDIME/AVIBACTAM 2 SENSITIVE Sensitive     CEFTOLOZANE/TAZOBACTAM 1 SENSITIVE Sensitive     TOBRAMYCIN <=1 SENSITIVE Sensitive     CEFTAZIDIME 2 SENSITIVE Sensitive     * RARE PSEUDOMONAS AERUGINOSA  Culture, blood (Routine X 2) w Reflex to ID Panel     Status: None (Preliminary result)   Collection Time: 05/19/24  1:13 PM   Specimen: BLOOD RIGHT ARM  Result Value Ref Range Status   Specimen Description BLOOD RIGHT ARM  Final   Special Requests   Final    BOTTLES DRAWN AEROBIC AND ANAEROBIC Blood Culture adequate volume   Culture  Setup Time   Final    BUDDING YEAST SEEN AEROBIC BOTTLE ONLY CRITICAL RESULT CALLED TO, READ BACK BY AND VERIFIED WITH: PHARMD BLAKE WANNARAT ON 05/23/24 @ 1312 BY DRT Performed at [facility], [address], [place], [place] [address]    Culture YEAST  Final   Report Status PENDING  Incomplete  Culture, blood (Routine X 2) w Reflex to ID Panel     Status: None (Preliminary result)   Collection Time: 05/19/24  1:13 PM   Specimen: BLOOD LEFT ARM  Result Value Ref Range Status   Specimen Description BLOOD LEFT ARM  Final   Special Requests   Final    BOTTLES DRAWN AEROBIC AND ANAEROBIC Blood Culture results may not be optimal due to an inadequate volume of blood received in culture bottles   Culture   Final    NO GROWTH 4 DAYS Performed at [facility], [address], [place], [place] [address]    Report Status PENDING  Incomplete  Blood Culture ID Panel (Reflexed)     Status: Abnormal   Collection Time: 05/19/24  1:13 PM  Result Value Ref Range Status   Enterococcus faecalis NOT DETECTED NOT DETECTED Final   Enterococcus Faecium NOT DETECTED NOT DETECTED Final   Listeria monocytogenes NOT DETECTED  NOT DETECTED Final   Staphylococcus species NOT DETECTED NOT DETECTED Final   Staphylococcus aureus (BCID) NOT DETECTED NOT DETECTED Final   Staphylococcus epidermidis NOT DETECTED NOT DETECTED Final   Staphylococcus lugdunensis NOT DETECTED NOT DETECTED Final   Streptococcus species NOT DETECTED NOT DETECTED Final   Streptococcus agalactiae NOT DETECTED NOT DETECTED Final   Streptococcus pneumoniae NOT DETECTED NOT DETECTED Final   Streptococcus pyogenes NOT DETECTED NOT DETECTED Final   A.calcoaceticus-baumannii NOT DETECTED NOT  DETECTED Final   Bacteroides fragilis NOT DETECTED NOT DETECTED Final   Enterobacterales NOT DETECTED NOT DETECTED Final   Enterobacter cloacae complex NOT DETECTED NOT DETECTED Final   Escherichia coli NOT DETECTED NOT DETECTED Final   Klebsiella aerogenes NOT DETECTED NOT DETECTED Final   Klebsiella oxytoca NOT DETECTED NOT DETECTED Final   Klebsiella pneumoniae NOT DETECTED NOT DETECTED Final   Proteus species NOT DETECTED NOT DETECTED Final   Salmonella species NOT DETECTED NOT DETECTED Final   Serratia marcescens NOT DETECTED NOT DETECTED Final   Haemophilus influenzae NOT DETECTED NOT DETECTED Final   Neisseria meningitidis NOT DETECTED NOT DETECTED Final   Pseudomonas aeruginosa NOT DETECTED NOT DETECTED Final   Stenotrophomonas maltophilia NOT DETECTED NOT DETECTED Final   Candida albicans NOT DETECTED NOT DETECTED Final   Candida auris NOT DETECTED NOT DETECTED Final   Candida glabrata DETECTED (A) NOT DETECTED Final    Comment: CRITICAL RESULT CALLED TO, READ BACK BY AND VERIFIED WITH: PHARMD BLAKE WANNARAT ON 05/23/24 @ 1312 BY DRT    Candida krusei NOT DETECTED NOT DETECTED Final   Candida parapsilosis NOT DETECTED NOT DETECTED Final   Candida tropicalis NOT DETECTED NOT DETECTED Final   Cryptococcus neoformans/gattii NOT DETECTED NOT DETECTED Final    Comment: Performed at [facility], [address] [place], [place] [address]   Culture, Respiratory w Gram Stain     Status: None (Preliminary result)   Collection Time: 05/22/24  6:45 PM   Specimen: Bronchoalveolar Lavage; Respiratory  Result Value Ref Range Status   Specimen Description BRONCHIAL ALVEOLAR LAVAGE  Final   Special Requests NONE  Final   Gram Stain   Final    FEW WBC PRESENT, PREDOMINANTLY PMN FEW BUDDING YEAST SEEN    Culture   Final    MODERATE YEAST CULTURE REINCUBATED FOR BETTER GROWTH Performed at [facility], [address], [place], [place] [address]    Report Status PENDING  Incomplete  Pneumocystis smear by DFA     Status: None   Collection Time: 05/22/24  6:45 PM   Specimen: Bronchoalveolar Lavage; Respiratory  Result Value Ref Range Status   Specimen Source-[facility] BRONCHIAL ALVEOLAR LAVAGE  Final   Pneumocystis jiroveci Ag See Scanned report in [place] Link  Final    Comment: Performed at [facility], [address], [place], [place] [address]    Corean Fireman, MSN, NP-C [facility] Pager: [phone]  05/23/2024 2:12 PM    Total Encounter Time: 25 m

## 2024-05-23 NOTE — Progress Notes (Signed)
 PHARMACY - PHYSICIAN COMMUNICATION CRITICAL VALUE ALERT - BLOOD CULTURE IDENTIFICATION (BCID)  Keith Gibbs is an 63 y.o. male who presented to [facility] on 05/09/2024 for lobectomy with CTS on 05/09/24, found to have Pseudomonas aeruginosa, Candida glabrata, Candida tropicalis on 05/19/24 BAL culture, and is now on ECMO.   Assessment:  Budding yeast 1/4 bottles (aerobic), BCID = Candida glabrata   Name of physician (or Provider) Contacted: Nidia Schaffer, PharmD (pharmacist rounding with cardiology team)  Current antibiotics: vancomycin , meropenem, micafungin  Changes to prescribed antibiotics recommended:  - Continue current therapy for now as this is an automatic ID consult and ID team will see patient  Results for orders placed or performed during the hospital encounter of 05/09/24  Blood Culture ID Panel (Reflexed) (Collected: 05/19/2024  1:13 PM)  Result Value Ref Range   Enterococcus faecalis NOT DETECTED NOT DETECTED   Enterococcus Faecium NOT DETECTED NOT DETECTED   Listeria monocytogenes NOT DETECTED NOT DETECTED   Staphylococcus species NOT DETECTED NOT DETECTED   Staphylococcus aureus (BCID) NOT DETECTED NOT DETECTED   Staphylococcus epidermidis NOT DETECTED NOT DETECTED   Staphylococcus lugdunensis NOT DETECTED NOT DETECTED   Streptococcus species NOT DETECTED NOT DETECTED   Streptococcus agalactiae NOT DETECTED NOT DETECTED   Streptococcus pneumoniae NOT DETECTED NOT DETECTED   Streptococcus pyogenes NOT DETECTED NOT DETECTED   A.calcoaceticus-baumannii NOT DETECTED NOT DETECTED   Bacteroides fragilis NOT DETECTED NOT DETECTED   Enterobacterales NOT DETECTED NOT DETECTED   Enterobacter cloacae complex NOT DETECTED NOT DETECTED   Escherichia coli NOT DETECTED NOT DETECTED   Klebsiella aerogenes NOT DETECTED NOT DETECTED   Klebsiella oxytoca NOT DETECTED NOT DETECTED   Klebsiella pneumoniae NOT DETECTED NOT DETECTED   Proteus species NOT DETECTED NOT DETECTED    Salmonella species NOT DETECTED NOT DETECTED   Serratia marcescens NOT DETECTED NOT DETECTED   Haemophilus influenzae NOT DETECTED NOT DETECTED   Neisseria meningitidis NOT DETECTED NOT DETECTED   Pseudomonas aeruginosa NOT DETECTED NOT DETECTED   Stenotrophomonas maltophilia NOT DETECTED NOT DETECTED   Candida albicans NOT DETECTED NOT DETECTED   Candida auris NOT DETECTED NOT DETECTED   Candida glabrata DETECTED (A) NOT DETECTED   Candida krusei NOT DETECTED NOT DETECTED   Candida parapsilosis NOT DETECTED NOT DETECTED   Candida tropicalis NOT DETECTED NOT DETECTED   Cryptococcus neoformans/gattii NOT DETECTED NOT DETECTED    Feliciano Close, PharmD PGY2 Infectious Diseases Pharmacy Resident  05/23/2024 2:05 PM

## 2024-05-23 NOTE — Progress Notes (Signed)
 PHARMACY - ANTICOAGULATION CONSULT NOTE  Pharmacy Consult for heparin  >> bivalirudin  Indication: VTE, VV ECMO (10/29 - current)  Allergies  Allergen Reactions   Asenapine Other (See Comments) and Nausea And Vomiting    Increased tremors   Dextromethorphan Hbr    Guaifenesin     Latuda [Lurasidone Hcl] Other (See Comments)    Tremors     Lurasidone Other (See Comments) and Hives    Other Reaction(s): Angioedema   Phenylephrine      Patient Measurements: Height: 6' 3 (190.5 cm) Weight: 109.3 kg (240 lb 15.4 oz) IBW/kg (Calculated) : 84.5  Vital Signs: Temp: 98.8 F (37.1 C) (10/30 1400) Temp Source: Bladder (10/30 0400) BP: 110/63 (10/30 1200) Pulse Rate: 58 (10/30 1400)  Labs: Recent Labs    05/22/24 0451 05/22/24 0746 05/22/24 1710 05/22/24 1845 05/22/24 1904 05/22/24 2243 05/23/24 0300 05/23/24 0445 05/23/24 0453 05/23/24 1230 05/23/24 1233  HGB 10.4*   < > 9.4*  --    < > 9.4*  --  8.9* 8.5*  --  8.8*  HCT 32.0*   < > 30.8*  --    < > 29.5*  --  28.0* 25.0*  --  26.0*  PLT 215  --  201  --   --  243  --  263  --   --   --   APTT  --    < > >200*  --   --  65* 58*  --   --  35  --   LABPROT  --   --  16.8*  --   --   --   --  15.3*  --   --   --   INR  --   --  1.3*  --   --   --   --  1.1  --   --   --   HEPARINUNFRC 0.25*  --  >1.10* >1.10*  --   --  0.33  --   --   --   --   CREATININE 0.98  --  1.11  --   --   --   --  0.88  --   --   --    < > = values in this interval not displayed.    Estimated Creatinine Clearance: 114.7 mL/min (by C-G formula based on SCr of 0.88 mg/dL).   Medical History: Past Medical History:  Diagnosis Date   Anginal pain    Anxiety    Arthritis    Bipolar 1 disorder (HCC)    Bursitis    CAD (coronary artery disease)    Stent placed 2017   Cancer (HCC)    Chronic pain    COPD (chronic obstructive pulmonary disease) (HCC)    Current use of long term anticoagulation    DAPT (ASA + clopidogrel )   Depression     Diverticulitis    Dyspnea    GERD (gastroesophageal reflux disease)    Grade I diastolic dysfunction    Hepatitis C 2012   No longer has Hep C   HLD (hyperlipidemia)    Hypertension    MI (myocardial infarction) (HCC)    Polysubstance abuse (HCC)    cocaine, marijuana, ETOH   PUD (peptic ulcer disease)    S/P angioplasty with stent 06/10/2016   a.) 90% stenosis of pLAD to mLAD - 2.5 x 18 mm Xience Alpine (DES x 1) placed to pLAD   S/P PTCA (percutaneous transluminal coronary angioplasty) 12/04/2019   a.) 60%  in stent restenosis of DES to pLAD; LVEF 65%.   Schizophrenia (HCC)    Stroke (HCC)    Tremors    generalized   Valvular insufficiency    a.) Mild MR, TR, PR; mild to moderate AR on 03/05/2018 TTE      Assessment: 63 yoM admitted for RUL lobectomy.  Pt intubated, now having difficulty with ventilation. Placed on VV ECMO 10/29 in the evening. Pharmacy to dose IV bivalirudin .   Previously on therapeutic heparin  (HL 0.33 on 1700/h) for acute DVT of gastrocnemius vein   Hgb (8.5) and PLTs (478) are stable. No evidence of new bleeding, just some old dried blood when suctioning his ET tube.  Transitioning to bivalirudin  this morning. Will start the infusion with no bolus with ECMO cannulation. LDH 478 and fibrinogen 551 are stable.   10/30 PM update - initial aPTT 35 sec is below goal on 0.02 mg/kg/hr.  No new issues from AM rounds.    Goal of Therapy:  Heparin  level 0.3-0.5 aPTT 50-80s Monitor platelets by anticoagulation protocol: Yes   Plan:  Increase bivalirudin  to 0.03 mg/kg/hr (~50% increase) Check aPTT now and every 4 hours until therapeutic Monitor twice daily aPTT and CBC  Thank you for allowing pharmacy to be a part of this patient's care.   Maurilio Fila, PharmD Clinical Pharmacist 05/23/2024  2:31 PM

## 2024-05-23 NOTE — Progress Notes (Signed)
 RT NOTE: does not meet SBT criteria for this AM for PEEP/FIO2 requirements.  Tolerating current ventilator settings well at this time.  Will continue to monitor.

## 2024-05-23 NOTE — Progress Notes (Signed)
 OT Cancellation Note  Patient Details Name: IZELL LABAT MRN: [id number] DOB: 02-27-1961   Cancelled Treatment:    Reason Eval/Treat Not Completed: Patient not medically ready. Per chart review pt intubated, sedated, now on ECMO support. Will monitor for a couple of days to determine readiness for OT evaluation.   Alaiya Martindelcampo C, OT  Acute Rehabilitation Services Office [phone] Secure chat preferred    Adrianne GORMAN Savers 05/23/2024, 7:27 AM

## 2024-05-23 NOTE — Progress Notes (Signed)
 The proposed treatment discussed in conference is for discussion purpose only and is not a binding recommendation.  The patients have not been physically examined, or presented with their treatment options.  Therefore, final treatment plans cannot be decided.

## 2024-05-23 NOTE — Progress Notes (Addendum)
 Nutrition Follow-up  DOCUMENTATION CODES:   Non-severe (moderate) malnutrition in context of chronic illness  INTERVENTION:  Re-initiate tube feeding via OG: Vital 1.5 at 60 ml/hr (1440 ml per day) Re-initiate at 20ml/hr and increase by 10ml/hr q8h until goal rate achieved Pro-Source TF20 60 mL QID  Goal TF regimen provides 177 g of protein, 2480kcals, 1100 mL of free water    If no BM post initiation of TF, recommend increasing/adjusting bowel regimen   NUTRITION DIAGNOSIS:  Moderate Malnutrition related to chronic illness as evidenced by moderate muscle depletion, moderate fat depletion. - remains applicable   GOAL:  Patient will meet greater than or equal to 90% of their needs - to be met via TF at goal rate  MONITOR:   Vent status, Labs, Weight trends, TF tolerance  REASON FOR ASSESSMENT:   Consult, Ventilator Enteral/tube feeding initiation and management  ASSESSMENT:   63 yo male admitted for surgery with known RUL lung nodule with plan for lobectomy on 10/16. Pt with respiratory decline requiring transfer to ICU 10/21 and intubated on 10/26.  PMH includes COPD, Stroke, MI, GERD, HTN, Hep C, PUD, diverticulitis, HF, hx of polysubstance abuse (Cocaine, marijuana, EtOH), anxiety/depression, Bipolar 1 disorder, schizophrenia  10/16 OR: Robotic assisted R thoracoscopy, RUL wedge resection, R upper lobectomy, lymph node dissection 10/26 Worsening respiratory status requiring intubation, iNO started 10/27 trickle TF started 10/28 TF titrating up 10/29 Cortrak placed; cannulated for VV ECMO 10/30 TF held this morning; re-initiated in afternoon - no s/sx aspiration   Pt currently on vent support. No pressor support currently. On Nimbex and dilaudid .   Cannulated for VV ECMO yesterday. TF was up to goal rate at that time. Concern this morning as what appears to be tube feeds suctioned from throat. Likely r/t patient lying flat during ECMO cannulation yesterday. No s/sx of  aspiration. Cortrak remains in appropriate position.   Continues with constipation. Per abdominal x-ray, non-obstructive gas pattern observed. MD ordered lactulose TID. If ineffective, would recommend introduction of promotility agent, like Reglan . RD spoke with critical care MD amicable to re-initiating TF and titrating back up to goal rate. Needs re-estimated d/t initiation of ECMO.    Current wt 109.3 kg (240.5 pounds). Admission wt 118.7 kg (262 pounds). Recent UBW around 237 pounds. UOP 2275 mL in 24 hours.   Labs: CBGs 113-200 Sodium 145 (wdl) Potassium 5.2 (H) BUN 46 Creatinine 0.88   Meds: Furosemide SS Novolog  Pantoprazole  Miralax  daily Decadron     Diet Order:   Diet Order             Diet NPO time specified  Diet effective now                  EDUCATION NEEDS:  Not appropriate for education at this time  Skin:  Skin Assessment: Skin Integrity Issues: Skin Integrity Issues:: Incisions Incisions: chest (surgical)  Last BM:  10/22  Height:  Ht Readings from Last 1 Encounters:  05/19/24 6' 3 (1.905 m)   Weight:  Wt Readings from Last 1 Encounters:  05/23/24 109.3 kg   Ideal Body Weight:  89.1 kg  BMI:  Body mass index is 30.12 kg/m.  Estimated Nutritional Needs:   Kcal:  2200-2500 kcals  Protein:  175-190g  Fluid:  >/= 2L  Blair Deaner MS, RD, LDN Registered Dietitian Clinical Nutrition RD Inpatient Contact Info in Amion

## 2024-05-23 NOTE — Progress Notes (Signed)
 Advanced Heart Failure Rounding Note   Subjective:     Placed on VV ECMO yesterday   Remains intubated/sedated.   Had chugging overnight and received albumine  ABG 7.40/32/6893% 60% FIO2 on vent   Bcx with candida glabrata   Objective:   Weight Range:  Vital Signs:   Temp:  [98.4 F (36.9 C)-100.4 F (38 C)] 98.6 F (37 C) (10/30 1230) Pulse Rate:  [0-98] 67 (10/30 1230) Resp:  [13-73] 66 (10/30 1230) BP: (84-160)/(36-73) 110/63 (10/30 1200) SpO2:  [65 %-100 %] 95 % (10/30 1230) Arterial Line BP: (93-174)/(40-68) 136/49 (10/30 1230) FiO2 (%):  [60 %-80 %] 60 % (10/30 1117) Weight:  [109.3 kg] 109.3 kg (10/30 0500) Last BM Date : 05/15/24  Weight change: Filed Weights   05/21/24 0359 05/22/24 0500 05/23/24 0500  Weight: 111.5 kg 109.5 kg 109.3 kg    Intake/Output:   Intake/Output Summary (Last 24 hours) at 05/23/2024 1401 Last data filed at 05/23/2024 1217 Gross per 24 hour  Intake 4483.51 ml  Output 2500 ml  Net 1983.51 ml     Physical Exam: General: Intubated/sedated HEENT: normal + ETT Neck: supple. JVRIJ ECMO cannula Cor: Regular rate & rhythm. No rubs, gallops or murmurs. Lungs: decreased throughout   + R CT Abdomen: soft, nontender, nondistended. No hepatosplenomegaly. No bruits or masses. Good bowel sounds. Extremities: no cyanosis, clubbing, rash, edema Neuro: asedated  Telemetry: NSR 80s Personally reviewed   Labs: Basic Metabolic Panel: Recent Labs  Lab 05/20/24 0500 05/20/24 1019 05/21/24 0421 05/21/24 1424 05/22/24 0451 05/22/24 0746 05/22/24 1710 05/22/24 1904 05/23/24 0445 05/23/24 0453 05/23/24 1233  NA 135   < > 139   < > 143   < > 143 142 146* 145 144  K 4.6   < > 4.7   < > 4.8   < > 5.3* 5.7* 5.3* 5.2* 4.6  CL 94*  --  100  --  100  --  104  --  106  --   --   CO2 25  --  29  --  29  --  29  --  29  --   --   GLUCOSE 185*  --  161*  --  134*  --  200*  --  113*  --   --   BUN 47*  --  48*  --  51*  --  51*  --   46*  --   --   CREATININE 1.85*  --  1.12  --  0.98  --  1.11  --  0.88  --   --   CALCIUM  7.6*  --  7.6*  --  7.9*  --  7.5*  --  7.5*  --   --   MG 2.6*  --  3.3*  --  3.1*  --   --   --  3.1*  --   --   PHOS 7.1*  --  3.2  --  3.2  --   --   --  3.1  --   --    < > = values in this interval not displayed.    Liver Function Tests: Recent Labs  Lab 05/22/24 0451 05/22/24 1710 05/23/24 0445  AST 42* 54* 31  ALT 25 28 23   ALKPHOS 92 79 79  BILITOT 0.4 0.6 0.5  PROT 5.9* 5.1* 5.2*  ALBUMIN  1.9* 1.6* 1.8*   No results for input(s): LIPASE, AMYLASE in the last 168 hours. No results  for input(s): AMMONIA in the last 168 hours.  CBC: Recent Labs  Lab 05/19/24 0232 05/19/24 0821 05/20/24 0500 05/20/24 1019 05/21/24 0421 05/21/24 1424 05/22/24 0451 05/22/24 0746 05/22/24 1710 05/22/24 1904 05/22/24 2243 05/23/24 0445 05/23/24 0453 05/23/24 1233  WBC 14.5*  --  16.2*  --  16.2*  --  19.7*  --  22.9*  --  23.7* 26.7*  --   --   NEUTROABS 10.9*  --  13.3*  --  11.7*  --  13.5*  --   --   --   --  18.6*  --   --   HGB 10.3*   < > 11.1*   < > 10.3*   < > 10.4*   < > 9.4* 8.8* 9.4* 8.9* 8.5* 8.8*  HCT 30.1*   < > 32.8*   < > 30.2*   < > 32.0*   < > 30.8* 26.0* 29.5* 28.0* 25.0* 26.0*  MCV 95.3  --  96.2  --  96.5  --  100.6*  --  105.5*  --  102.4* 102.2*  --   --   PLT 174  --  148*  --  177  --  215  --  201  --  243 263  --   --    < > = values in this interval not displayed.    Cardiac Enzymes: No results for input(s): CKTOTAL, CKMB, CKMBINDEX, TROPONINI in the last 168 hours.  BNP: BNP (last 3 results) Recent Labs    05/30/23 1059 03/20/24 1419  BNP 34.3 20.6    ProBNP (last 3 results) No results for input(s): PROBNP in the last 8760 hours.    Other results:  Imaging: DG CHEST PORT 1 VIEW Result Date: 05/23/2024 CLINICAL DATA:  Intubation. EXAM: PORTABLE CHEST 1 VIEW COMPARISON:  05/23/2024 at 5:17 a.m. FINDINGS: Enteric tube courses into  the region of the stomach and off the image as tip is not visualized. Endotracheal tube has tip approximately 4.8 cm above the carina. Right-sided chest tube unchanged. Left IJ central venous catheter with tip over the SVC unchanged. Surgical suture over the right perihilar region and mid lung. Moderate right-sided pneumothorax possibly slightly worse. Stable hazy opacification throughout the left lung. Left basilar opacification likely effusion with atelectasis. Stable right base opacification. Stable borderline cardiomegaly. Remainder of the exam is unchanged. IMPRESSION: 1. Moderate right-sided pneumothorax possibly slightly worse. Right-sided chest tube unchanged. 2. Stable hazy opacification throughout the left lung. Stable left base/retrocardiac opacification and stable right base opacification. 3. Tubes and lines as described. Electronically Signed   By: Toribio Agreste M.D.   On: 05/23/2024 12:02   DG Chest Port 1 View Result Date: 05/23/2024 EXAM: 1 VIEW(S) XRAY OF THE CHEST 05/23/2024 05:15:00 AM COMPARISON: Portable chest yesterday at 5:18 pm. CLINICAL HISTORY: ARDS (adult respiratory distress syndrome) (HCC) 33510. Reason for exam: ARDS. FINDINGS: LINES, TUBES AND DEVICES: Right apical chest tube positioning, ECMO catheter extending both above and below the plane of imaging, both unchanged. ETT tip is 5.6 cm from the carina. Feeding tube extends into the stomach with the intragastric course out of view. A left IJ central line has a tip near the superior cavoatrial junction. LUNGS AND PLEURA: Diffuse bilateral airspace disease continues to be seen, denser on the right due to the partial lung collapse. No new airspace disease is seen. There is a right pneumothorax of moderate size with 9 cm pleuroparenchymal separation previously 8 cm. There are small pleural effusions which  appear similar. HEART AND MEDIASTINUM: Stable cardiomegaly. Stable mediastinum. No substantial mediastinal shift is seen. BONES AND  SOFT TISSUES: No new osseous findings. IMPRESSION: 1. Moderate right pneumothorax with slight interval increase to 9 cm pleuroparenchymal separation, without substantial mediastinal shift. 2. Diffuse bilateral airspace disease, greater on the right due to partial lung collapse, without new airspace disease. Electronically signed by: Francis Quam MD 05/23/2024 07:09 AM EDT RP Workstation: [id number]   DG Chest Port 1 View Result Date: 05/22/2024 EXAM: 1 VIEW XRAY OF THE CHEST 05/22/2024 01:50:00 PM COMPARISON: Comparison is made to a study from 05/22/2024. CLINICAL HISTORY: ARDS (adult respiratory distress syndrome); PATIENT LAYING PRONE. FINDINGS: LINES, TUBES AND DEVICES: Stable right-sided chest tube. Feeding tube in her stomach. Endotracheal tube unchanged. LUNGS AND PLEURA: Stable bilateral lung opacities. Grossly stable right apical pneumothorax. No pulmonary edema. No pleural effusion. HEART AND MEDIASTINUM: No acute abnormality of the cardiac and mediastinal silhouettes. BONES AND SOFT TISSUES: No acute osseous abnormality. IMPRESSION: 1. Stable bilateral lung opacities, consistent with ARDS. 2. Stable right apical pneumothorax. 3. Stable right-sided chest tube. 4. Enteric tube appropriately positioned in the stomach. 5. Endotracheal tube unchanged in position. Electronically signed by: Lynwood Seip MD 05/22/2024 05:35 PM EDT RP Workstation: [id number]   DG Chest Port 1 View Result Date: 05/22/2024 CLINICAL DATA:  ECMO. EXAM: PORTABLE CHEST 1 VIEW COMPARISON:  Earlier radiograph dated 05/22/2024. FINDINGS: Right-sided chest tube in similar position. No significant interval change in the size of right pneumothorax. Interval removal of the enteric tube and placement of a feeding tube. Additional support apparatus in similar position. No significant interval change in bilateral pulmonary opacities. Stable cardiomegaly. No acute osseous pathology. IMPRESSION: No significant interval change in the size of  right pneumothorax or bilateral pulmonary opacities. Electronically Signed   By: Vanetta Chou M.D.   On: 05/22/2024 17:30   CARDIAC CATHETERIZATION Result Date: 05/22/2024 Successful VV ECMO placement.   DG CHEST PORT 1 VIEW Result Date: 05/22/2024 CLINICAL DATA:  Follow-up right pneumothorax and ARDS. EXAM: PORTABLE CHEST 1 VIEW COMPARISON:  05/21/2024. FINDINGS: Stable right chest tube with its tip near the right lung apex. Decreased size of the previously demonstrated moderate right pneumothorax. No mediastinal shift. Endotracheal tube in satisfactory position. Stable left PICC with its tip in the superior vena cava, approximately 6 cm above the superior cavoatrial junction. Nasogastric tube extending into the stomach. Mildly improved diffuse bilateral airspace opacity. No pleural fluid seen. Right shoulder prosthesis. IMPRESSION: 1. Decreased size of the previously demonstrated moderate right pneumothorax. 2. Mildly improved diffuse bilateral airspace opacity compatible with ARDS. 3. Stable left PICC with its tip in the superior vena cava, approximately 6 cm above the superior cavoatrial junction. Electronically Signed   By: Elspeth Bathe M.D.   On: 05/22/2024 12:58   DG Abd Portable 1V Result Date: 05/22/2024 EXAM: 1 VIEW XRAY OF THE ABDOMEN 05/22/2024 10:07:17 AM COMPARISON: 05/20/2024 CLINICAL HISTORY: feeding tube placement ,? Post pyloric; rover FINDINGS: LINES, TUBES AND DEVICES: Feeding tube in place with tip terminating over the distal stomach. BOWEL: Nonobstructive bowel gas pattern. SOFT TISSUES: No opaque urinary calculi. BONES: Lumbosacral spine fixation hardware and intervertebral disc spacers noted. IMPRESSION: 1. Feeding tube in place with the tip terminating over the distal stomach. Electronically signed by: Donnice Mania MD 05/22/2024 11:02 AM EDT RP Workstation: [id number]   DG CHEST PORT 1 VIEW Result Date: 05/21/2024 CLINICAL DATA:  Hypoxia EXAM: PORTABLE CHEST 1 VIEW  COMPARISON:  Earlier same day chest radiograph FINDINGS: Lines/tubes:  Endotracheal tube tip projects 4.6 cm above the carina. Gastric/enteric tube tip projects over the stomach. Left internal jugular venous catheter tip projects over the SVC. Similar position of right apical pleural catheter. Lungs: Increased right lung volume loss. Diffusely increased left lung interstitial and patchy opacities. Pleura: Increased moderate right pneumothorax. Heart/mediastinum: Similar enlarged cardiomediastinal silhouette. Bones: No acute osseous abnormality. New small volume right lateral chest wall subcutaneous emphysema. IMPRESSION: 1. Increased moderate right pneumothorax and increased right lung volume loss. 2. Similar position of right apical pleural catheter. 3. Diffusely increased left lung interstitial and patchy opacities, which may reflect a combination of atelectasis, edema, and/or ARDS. Electronically Signed   By: Limin  Xu M.D.   On: 05/21/2024 19:29     Medications:     Scheduled Medications:  arformoterol  15 mcg Nebulization BID   artificial tears  1 Application Both Eyes Q8H   budesonide  (PULMICORT ) nebulizer solution  0.25 mg Nebulization BID   Chlorhexidine  Gluconate Cloth  6 each Topical Daily   feeding supplement (PROSource TF20)  60 mL Per Tube Daily   furosemide  40 mg Intravenous Daily   gabapentin   600 mg Per Tube Daily   And   gabapentin   1,200 mg Per Tube QHS   insulin aspart  0-15 Units Subcutaneous Q4H   ipratropium-albuterol   3 mL Nebulization Q4H   lactulose  10 g Per Tube TID   pantoprazole  (PROTONIX ) IV  40 mg Intravenous QHS   polyethylene glycol  17 g Per Tube Daily   QUEtiapine   100 mg Per Tube Daily   QUEtiapine   200 mg Per Tube QHS   revefenacin  175 mcg Nebulization Daily   senna-docusate  2 tablet Per Tube QHS   sodium chloride  flush  10-40 mL Intracatheter Q12H    Infusions:  sodium chloride  Stopped (05/23/24 9357)   albumin  human     bivalirudin  (ANGIOMAX )  250 mg in sodium chloride  0.9 % 500 mL (0.5 mg/mL) infusion 0.02 mg/kg/hr (05/23/24 1217)   cisatracurium (NIMBEX) 200 mg in sodium chloride  0.9 % 100 mL (2 mg/mL) infusion 3.5 mcg/kg/min (05/23/24 1217)   feeding supplement (VITAL 1.5 CAL) 20 mL/hr at 05/23/24 1225   HYDROmorphone  1.5 mg/hr (05/23/24 1217)   meropenem (MERREM) IV Stopped (05/23/24 0550)   micafungin (MYCAMINE) 200 mg in sodium chloride  0.9 % 100 mL IVPB Stopped (05/22/24 2104)   midazolam  14 mg/hr (05/23/24 1217)   norepinephrine (LEVOPHED) Adult infusion Stopped (05/23/24 9388)   vancomycin  150 mL/hr at 05/23/24 1217    PRN Medications: sodium chloride , albumin  human, hydrALAZINE , HYDROmorphone , HYDROmorphone , hydrOXYzine , lactulose, midazolam , ondansetron  (ZOFRAN ) IV, mouth rinse, sodium chloride  flush   Assessment/Plan:   1. 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 ARDS - s/p VV ECMO cannulation 05/22/24 - see ECMO flowsheet 2. RUL lung CA s/p lobectomy 3. COPD with ongoing tobacco use 4. Bipolar d/o 5. CAD 6. Candida glabrata fungemia  Remains critically ill. Respiratory parameters improved on VV ECMO. Will continue. Continue rest settings on vent per CCM. Plan trach Monday with ENT  Course now complicated by fungemia. D/w ID and CCM. Now on micafungin   CRITICAL CARE Performed by: Ilhan Debenedetto  Total critical care time: 50 minutes  Critical care time was exclusive of separately billable procedures and treating other patients.  Critical care was necessary to treat or prevent imminent or life-threatening deterioration.  Critical care was time spent personally by me (independent of midlevel providers or residents) on the following activities: development of treatment plan  with patient and/or surrogate as well as nursing, discussions with consultants, evaluation of patient's response to treatment, examination of patient, obtaining history from patient or surrogate, ordering and  performing treatments and interventions, ordering and review of laboratory studies, ordering and review of radiographic studies, pulse oximetry and re-evaluation of patient's condition.  Toribio Fuel, MD  2:07 PM     Length of Stay: [address] Anquan Azzarello 05/23/2024, 2:01 PM  Advanced Heart Failure Team Pager [phone] (M-F; 7a - 4p)  Please contact [facility] for night-coverage after hours (4p -7a ) and weekends on [email or web address]

## 2024-05-23 NOTE — Progress Notes (Signed)
 PHARMACY - ANTICOAGULATION CONSULT NOTE  Pharmacy Consult for heparin  >> bivalirudin  Indication: VTE, VV ECMO (10/29 - current)  Allergies  Allergen Reactions   Asenapine Other (See Comments) and Nausea And Vomiting    Increased tremors   Dextromethorphan Hbr    Guaifenesin     Latuda [Lurasidone Hcl] Other (See Comments)    Tremors     Lurasidone Other (See Comments) and Hives    Other Reaction(s): Angioedema   Phenylephrine      Patient Measurements: Height: 6' 3 (190.5 cm) Weight: 109.3 kg (240 lb 15.4 oz) IBW/kg (Calculated) : 84.5  Vital Signs: Temp: 98.8 F (37.1 C) (10/30 1900) BP: 94/51 (10/30 1600) Pulse Rate: 53 (10/30 1900)  Labs: Recent Labs    05/22/24 1710 05/22/24 1845 05/22/24 1904 05/22/24 2243 05/23/24 0300 05/23/24 0445 05/23/24 0453 05/23/24 1230 05/23/24 1233 05/23/24 1717 05/23/24 1724 05/23/24 1852  HGB 9.4*  --    < > 9.4*  --  8.9*   < >  --  8.8* 8.9* 8.5*  --   HCT 30.8*  --    < > 29.5*  --  28.0*   < >  --  26.0* 28.4* 25.0*  --   PLT 201  --   --  243  --  263  --   --   --  223  --   --   APTT >200*  --   --  65* 58*  --   --  35  --   --   --  36  LABPROT 16.8*  --   --   --   --  15.3*  --   --   --   --   --   --   INR 1.3*  --   --   --   --  1.1  --   --   --   --   --   --   HEPARINUNFRC >1.10* >1.10*  --   --  0.33  --   --   --   --   --   --   --   CREATININE 1.11  --   --   --   --  0.88  --   --   --  1.00  --   --    < > = values in this interval not displayed.    Estimated Creatinine Clearance: 101 mL/min (by C-G formula based on SCr of 1 mg/dL).   Medical History: Past Medical History:  Diagnosis Date   Anginal pain    Anxiety    Arthritis    Bipolar 1 disorder (HCC)    Bursitis    CAD (coronary artery disease)    Stent placed 2017   Cancer (HCC)    Chronic pain    COPD (chronic obstructive pulmonary disease) (HCC)    Current use of long term anticoagulation    DAPT (ASA + clopidogrel )   Depression     Diverticulitis    Dyspnea    GERD (gastroesophageal reflux disease)    Grade I diastolic dysfunction    Hepatitis C 2012   No longer has Hep C   HLD (hyperlipidemia)    Hypertension    MI (myocardial infarction) (HCC)    Polysubstance abuse (HCC)    cocaine, marijuana, ETOH   PUD (peptic ulcer disease)    S/P angioplasty with stent 06/10/2016   a.) 90% stenosis of pLAD to mLAD - 2.5 x 18 mm Xience  Alpine (DES x 1) placed to pLAD   S/P PTCA (percutaneous transluminal coronary angioplasty) 12/04/2019   a.) 60% in stent restenosis of DES to pLAD; LVEF 65%.   Schizophrenia (HCC)    Stroke (HCC)    Tremors    generalized   Valvular insufficiency    a.) Mild MR, TR, PR; mild to moderate AR on 03/05/2018 TTE      Assessment: 63 yoM admitted for RUL lobectomy.  Pt intubated, now having difficulty with ventilation. Placed on VV ECMO 10/29 in the evening. Pharmacy to dose IV bivalirudin .   Previously on therapeutic heparin  (HL 0.33 on 1700/h) for acute DVT of gastrocnemius vein   Hgb (8.5) and PLTs (478) are stable. No evidence of new bleeding, just some old dried blood when suctioning his ET tube.  Transitioning to bivalirudin  this morning. Will start the infusion with no bolus with ECMO cannulation. LDH 478 and fibrinogen 551 are stable.   PM update -  aPTT 36 sec is below goal on 0.02 mg/kg/hr.  No new issues from AM rounds.    Goal of Therapy:  Heparin  level 0.3-0.5 aPTT 50-80s Monitor platelets by anticoagulation protocol: Yes   Plan:  Increase bivalirudin  to 0.04 mg/kg/hr Check aPTT every 4 hours until therapeutic Monitor twice daily aPTT and CBC  Thank you for allowing pharmacy to be a part of this patient's care.   Harlene Barlow, [employer], BCCP Clinical Pharmacist  05/23/2024 7:43 PM   [facility] pharmacy phone numbers are listed on [email or web address]

## 2024-05-23 NOTE — Progress Notes (Addendum)
 NAME:  Keith Gibbs, MRN:  [id number], DOB:  Oct 07, 1960, LOS: 14 ADMISSION DATE:  05/09/2024, CONSULTATION DATE: 05/14/2024 REFERRING MD: CTS, CHIEF COMPLAINT: Acute on chronic hypoxic respiratory failure  History of Present Illness:  63 yo male presented for lobectomy with CTS. Progressing as expected post operatively with CT in place and was on 4E when this morning he decompensated with worsening hypoxemia and increased wob. CTS transferred to ICU and requested CCM evaluation.     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  He was difficult to oxygenate and ventilate therefore APRV started.  Patient seems to be tolerating the APRV. 10/29 -nitric oxide is weaned off overnight.  It has not helped.  It did not change his respiratory status.  Patient continues to have ARDS with bilateral pulmonary infiltrates.  At this stage we decided to proning.  Chest tube is hooked up to suction. 10/29 -patient's oxygenation improved with proning but ventilation got worse.  At this stage given the ARDS a collective decision is made to cannulate the patient for VV ECMO.  Family was in agreement. 10/30 -blood cultures from 05/19/2024 grew Candida glabrata  Interim History / Subjective:  Patient was cannulated and max was taken off last night.  This morning he was getting very agitated and because of which there was low flows.  He was trying to reach out to the cannula send endotracheal tube.  SAT did not help.  This stage he is resedated.  There was also cuff leak and ETT was advanced.  Patient despite maximal sedation with Versed   and fentanyl  he was waking up.  At this stage we have decided to change fentanyl  to Dilaudid .  Restarted Nimbex.  If need be ketamine  will be used.  The bronc culture from 10/26 showed yeast in the blood culture from 10/26 showed Candida glabrata.    Objective    Blood pressure 110/63, pulse 67, temperature 98.6 F (37 C), resp. rate (!) 66, height 6' 3 (1.905 m), weight 109.3 kg, SpO2 95%. CVP:  [2 mmHg-16 mmHg] 9 mmHg  Vent Mode: PCV FiO2 (%):  [60 %-80 %] 60 % Set Rate:  [15 bmp-32 bmp] 15 bmp Vt Set:  [10 mL-540 mL] 10 mL PEEP:  [10 cmH20] 10 cmH20 Plateau Pressure:  [16 cmH20-31 cmH20] 21 cmH20   Intake/Output Summary (Last 24 hours) at 05/23/2024 1259 Last data filed at 05/23/2024 1217 Gross per 24 hour  Intake 4483.51 ml  Output 2500 ml  Net 1983.51 ml   Filed Weights   05/21/24 0359 05/22/24 0500 05/23/24 0500  Weight: 111.5 kg 109.5 kg 109.3 kg    Examination: General: Critically ill appearing man intubated, right IJ Aveline catheter.  Gets agitated when sedation is weaned down. HEENT: Both pupils equal sluggish reacting to light there is right internal jugular dual-lumen ECMO catheter.  Lungs: Diminished breath sounds right side.  Bibasal crackles.  Prolonged expiratory phase Cardiovascular: S1-S2 heard no S3 no murmur Abdomen: Soft nontender nondistended Extremities: No trauma mild pedal edema.  No deformities. Neuro: Sedated cannot complete a full exam   Resolved problem list   Assessment and Plan   63 year old male  who underwent right upper lobectomy due to lung adenocarcinoma, on the night of 10/21 POD 4, patient went into respiratory distress has been requiring heated high flow and have been deteriorating requiring intubation on 10/26.  No high fevers today.  Current temperature is 99.7.  Several modes of ventilation including nitric oxide did not help.  Pulmonary did not help.  Eventually patient went on VV ECMO for ARDS management.  Acute hypoxic  hypercapnic respiratory failure ARDS Status post right upper lobe pneumonectomy History of COPD with acute exacerbation FEV1/ FVC on 9/25 is 74.  PFTs read as mild obstructive lung disease. Acute pulmonary edema Possible aspiration Adenocarcinoma of the lung status post right upper lobe lobectomy Right pneumothorax was pneumonectomy status post chest tube placement  -Patient is cannulated for VV ECMO on 10/29 for ARDS as we failed to oxygenate and ventilate and several modes of ventilation. ECMO: Flow is 4.36 L/min Delta P is 28 eFiO2 88% Sweep is 8 L/min On bivalirudin  for anticoagulation  Ventilator: Pressure control ventilation with 60% FiO2 PEEP is 10 inspiratory pressure is 14 peak pressure is 24.  Dynamic compliance is 11.8  Sedation with Versed  and Dilaudid  Nimbex restarted as patient is waking up violently RASS goal -2 for today  - Nitric oxide is weaned down on 05/22/2024 - Continue Pulmicort  - Continue Decadron  for ARDS and COPD. - DuoNebs every 4 hours - Monitor chest tube output.  Currently there is no leak and is tidaling. - New IV Lasix at 40 mg daily - Tube feeds were restarted - ENT considering for possible tracheostomy on Monday if the VV ECMO prolongs. - Cuff leak resolved as the ETT was advanced.  Septic shock Fungal pneumonia -Candida from BAL on 05/19/2024 Fungemia -Candida glabrata on blood cultures from 05/19/2024 Pseudomonas pneumonia Pseudomonas growing from BAL 05/19/2024 - Continue broad-spectrum antibiotics for now with meropenem and vancomycin  - On IV micafungin - ID consult in place.. - Patient is off vasopressor therapy.  Acute metabolic encephalopathy ICU delirium, in the setting of patient with a history of anxiety, and schizoaffective disorder bipolar type  - Currently on Versed  and Dilaudid . - Start Nimbex. - On Neurontin  and Seroquel . - Stop celocoxib and hold cymbalta  as we cannot crush the medication for tube - Sodium valproate  will be held.  Acute Kidney Injury - likely pre renal from septic shock this is resolved. - Monitor renal functions and urine output. - Monitor electrolytes and replace accordingly.  DVT Lower extremity - Heparin  changed to bivalirudin  per ECMO protocol - Doppler shows lower extremity DVT in gastronemius vein   Increased NG output - Abdominal x-ray did not show any ileus. - Tube feeds are ongoing.    Anemia - Hemoglobin stable at 10.4.   Full Code On heparin  gtt. On Protonix    Spoke with the mother and updated.  She agrees to continue full cares for now.  She understands we may have to consider tracheostomy in future and she is agreeable for that.  Labs   CBC: Recent Labs  Lab 05/19/24 0232 05/19/24 0821 05/20/24 0500 05/20/24 1019 05/21/24 0421 05/21/24 1424 05/22/24 0451 05/22/24 0746 05/22/24 1710 05/22/24 1904 05/22/24 2243 05/23/24 0445 05/23/24 0453  WBC 14.5*  --  16.2*  --  16.2*  --  19.7*  --  22.9*  --  23.7* 26.7*  --   NEUTROABS 10.9*  --  13.3*  --  11.7*  --  13.5*  --   --   --   --  18.6*  --   HGB 10.3*   < > 11.1*   < > 10.3*   < > 10.4*   < > 9.4* 8.8* 9.4* 8.9* 8.5*  HCT 30.1*   < > 32.8*   < > 30.2*   < > 32.0*   < > 30.8* 26.0* 29.5* 28.0* 25.0*  MCV 95.3  --  96.2  --  96.5  --  100.6*  --  105.5*  --  102.4* 102.2*  --   PLT 174  --  148*  --  177  --  215  --  201  --  243 263  --    < > = values in this interval not displayed.    Basic Metabolic Panel: Recent Labs  Lab 05/20/24 0500 05/20/24 1019 05/21/24 0421 05/21/24 1424 05/22/24 0451 05/22/24 0746 05/22/24 1705 05/22/24 1710 05/22/24 1904 05/23/24 0445 05/23/24 0453  NA 135   < > 139   < > 143   < > 142 143 142 146* 145  K 4.6   < > 4.7   < > 4.8   < > 5.8* 5.3* 5.7* 5.3* 5.2*  CL 94*  --  100  --  100  --   --  104  --  106  --   CO2 25  --  29  --  29  --   --  29  --  29  --   GLUCOSE 185*  --  161*  --  134*  --   --  200*  --  113*  --   BUN 47*  --  48*  --   51*  --   --  51*  --  46*  --   CREATININE 1.85*  --  1.12  --  0.98  --   --  1.11  --  0.88  --   CALCIUM  7.6*  --  7.6*  --  7.9*  --   --  7.5*  --  7.5*  --   MG 2.6*  --  3.3*  --  3.1*  --   --   --   --  3.1*  --   PHOS 7.1*  --  3.2  --  3.2  --   --   --   --  3.1  --    < > = values in this interval not displayed.   GFR: Estimated Creatinine Clearance: 114.7 mL/min (by C-G formula based on SCr of 0.88 mg/dL). Recent Labs  Lab 05/17/24 1535 05/18/24 0244 05/20/24 1140 05/21/24 0421 05/22/24 0451 05/22/24 1609 05/22/24 1710 05/22/24 1730 05/22/24 2243 05/23/24 0445 05/23/24 0458  PROCALCITON 0.59  --   --   --   --   --   --   --   --   --   --   WBC  --    < >  --    < > 19.7*  --  22.9*  --  23.7* 26.7*  --   LATICACIDVEN  --    < > 2.0*  --   --  1.0  --  1.6  --   --  1.1   < > = values in this interval not displayed.    Liver Function Tests: Recent Labs  Lab 05/22/24 0451 05/22/24 1710 05/23/24 0445  AST 42* 54* 31  ALT 25 28 23   ALKPHOS 92 79 79  BILITOT 0.4 0.6 0.5  PROT 5.9* 5.1* 5.2*  ALBUMIN  1.9* 1.6* 1.8*    No results for input(s): LIPASE, AMYLASE in the last 168 hours. No results for input(s): AMMONIA in the last 168 hours.  ABG    Component Value Date/Time   PHART 7.402 05/23/2024 0453   PCO2ART 51.9 (H) 05/23/2024 0453   PO2ART 68 (L) 05/23/2024 0453   HCO3 32.3 (H) 05/23/2024 0453   TCO2 34 (H) 05/23/2024 0453   ACIDBASEDEF 1.0 05/19/2024 1154   O2SAT 93 05/23/2024 0453     Coagulation Profile: Recent Labs  Lab 05/22/24 1710 05/23/24 0445  INR 1.3* 1.1    Cardiac Enzymes: No results for input(s): CKTOTAL, CKMB, CKMBINDEX, TROPONINI in the last 168 hours.  HbA1C: Hgb A1c MFr Bld  Date/Time Value Ref Range Status  02/12/2024 08:40 AM 5.3 4.8 - 5.6 % Final    Comment:             Prediabetes: 5.7 - 6.4          Diabetes: >6.4          Glycemic control for adults with diabetes: <7.0   04/26/2022 03:30 PM  5.2 4.8 - 5.6 % Final    Comment:    (NOTE) Pre diabetes:          5.7%-6.4%  Diabetes:              >6.4%  Glycemic control for   <7.0% adults with diabetes     CBG: Recent Labs  Lab 05/22/24 1945 05/22/24 2324 05/23/24 0335 05/23/24 0743 05/23/24 1123  GLUCAP 184* 180* 122* 117* 75    Review of Systems:   Negative except above  Past Medical History:  He,  has a past medical history of Anginal pain, Anxiety, Arthritis, Bipolar 1 disorder (HCC), Bursitis, CAD (coronary artery disease), Cancer (HCC), Chronic pain, COPD (chronic obstructive pulmonary disease) (HCC), Current use of long term anticoagulation, Depression, Diverticulitis, Dyspnea, GERD (gastroesophageal reflux disease), Grade I diastolic dysfunction, Hepatitis C (2012), HLD (hyperlipidemia), Hypertension, MI (myocardial infarction) (HCC), Polysubstance abuse (HCC), PUD (peptic ulcer disease), S/P angioplasty with stent (06/10/2016), S/P PTCA (percutaneous transluminal coronary angioplasty) (12/04/2019), Schizophrenia (HCC), Stroke (HCC), Tremors, and Valvular insufficiency.   Surgical History:   Past Surgical History:  Procedure Laterality Date   ABDOMINAL SURGERY     removed small piece of intestines due to [facility] Diverticulosis   ANTERIOR LAT LUMBAR FUSION N/A 08/23/2022   Procedure: DIRECT LATERAL INTERBODY FUSION  LUMBAR TWO- LUMBAR THREE, LUMBAR THREE-LUMBAR FOUR , EXPLORE AND EXTEND FUSION LUMBAR TWO-LUMBAR FIVE, POSTERIOR DECOMPRESSION LUMBAR THREE-LUMBAR FOUR, LEFT LUMBAR TWO-LUMBAR THREE;  Surgeon: Debby Dorn MATSU, MD;  Location: [facility] OR;  Service: Neurosurgery;  Laterality: N/A;   APPENDECTOMY     BACK SURGERY     CARDIAC CATHETERIZATION Left 06/10/2016   Procedure: Left Heart Cath and Coronary Angiography;  Surgeon: Denyse DELENA Bathe, MD;  Location: [facility] INVASIVE CV LAB;  Service: Cardiovascular;  Laterality: Left;   CARDIAC CATHETERIZATION N/A 06/10/2016   Procedure: Coronary Stent Intervention;   Surgeon: Cara JONETTA Lovelace, MD;  Location: [facility] INVASIVE CV LAB;  Service: Cardiovascular;  Laterality: N/A;   CHOLECYSTECTOMY N/A 02/17/2022   Procedure: LAPAROSCOPIC CHOLECYSTECTOMY;  Surgeon: Stevie Herlene Righter, MD;  Location: [facility] OR;  Service: General;  Laterality: N/A;   COLON SURGERY     COLONOSCOPY     COLONOSCOPY WITH PROPOFOL  N/A 01/05/2017   Procedure: COLONOSCOPY WITH PROPOFOL ;  Surgeon: Therisa Bi, MD;  Location: [facility] ENDOSCOPY;  Service: Endoscopy;  Laterality: N/A;  COLONOSCOPY WITH PROPOFOL  N/A 02/13/2020   Procedure: COLONOSCOPY WITH PROPOFOL ;  Surgeon: Janalyn Keene NOVAK, MD;  Location: [facility] ENDOSCOPY;  Service: Endoscopy;  Laterality: N/A;   CORONARY ANGIOPLASTY WITH STENT PLACEMENT     CORONARY PRESSURE/FFR STUDY N/A 12/04/2019   Procedure: INTRAVASCULAR PRESSURE WIRE/FFR STUDY;  Surgeon: Wonda Sharper, MD;  Location: [facility] INVASIVE CV LAB;  Service: Cardiovascular;  Laterality: N/A;   ECMO CANNULATION N/A 05/22/2024   Procedure: ECMO CANNULATION;  Surgeon: Cherrie Toribio SAUNDERS, MD;  Location: [facility] INVASIVE CV LAB;  Service: Cardiovascular;  Laterality: N/A;   ESOPHAGOGASTRODUODENOSCOPY (EGD) WITH PROPOFOL  N/A 01/05/2017   Procedure: ESOPHAGOGASTRODUODENOSCOPY (EGD) WITH PROPOFOL ;  Surgeon: Therisa Bi, MD;  Location: [facility] ENDOSCOPY;  Service: Endoscopy;  Laterality: N/A;   ESOPHAGOGASTRODUODENOSCOPY (EGD) WITH PROPOFOL  N/A 02/13/2020   Procedure: ESOPHAGOGASTRODUODENOSCOPY (EGD) WITH PROPOFOL ;  Surgeon: Janalyn Keene NOVAK, MD;  Location: [facility] ENDOSCOPY;  Service: Endoscopy;  Laterality: N/A;   INTERCOSTAL NERVE BLOCK  05/09/2024   Procedure: BLOCK, NERVE, INTERCOSTAL;  Surgeon: Kerrin Elspeth BROCKS, MD;  Location: [facility] OR;  Service: Thoracic;;   KNEE ARTHROSCOPY WITH MEDIAL MENISECTOMY Right 09/05/2017   Procedure: KNEE ARTHROSCOPY WITH MEDIAL AND LATERAL  MENISECTOMY PARTIAL SYNOVECTOMY;  Surgeon: Kathlynn Sharper, MD;  Location: [facility] ORS;  Service: Orthopedics;  Laterality:  Right;   LEFT HEART CATH AND CORONARY ANGIOGRAPHY N/A 12/04/2019   Procedure: LEFT HEART CATH AND CORONARY ANGIOGRAPHY;  Surgeon: Wonda Sharper, MD;  Location: [facility] INVASIVE CV LAB;  Service: Cardiovascular;  Laterality: N/A;   LEFT HEART CATH AND CORONARY ANGIOGRAPHY Left 11/25/2022   Procedure: LEFT HEART CATH AND CORONARY ANGIOGRAPHY;  Surgeon: Fernand Denyse LABOR, MD;  Location: [facility] INVASIVE CV LAB;  Service: Cardiovascular;  Laterality: Left;   LOBECTOMY, LUNG, ROBOT-ASSISTED, USING VATS Right 05/09/2024   Procedure: LOBECTOMY, LUNG RIGHT UPPER LOBE, ROBOT-ASSISTED, USING VATS;  Surgeon: Kerrin Elspeth BROCKS, MD;  Location: [facility] OR;  Service: Thoracic;  Laterality: Right;   LYMPH NODE BIOPSY  05/09/2024   Procedure: LYMPH NODE BIOPSY;  Surgeon: Kerrin Elspeth BROCKS, MD;  Location: [facility] OR;  Service: Thoracic;;   REVERSE SHOULDER ARTHROPLASTY Right 08/31/2023   Procedure: RIGHT REVERSE SHOULDER ARTHROPLASTY;  Surgeon: [name], MD;  Location: [facility] OR;  Service: Orthopedics;  Laterality: Right;   SHOULDER SURGERY Right 04/09/2012   SPINE SURGERY     TOTAL KNEE ARTHROPLASTY Right 01/19/2021   Procedure: TOTAL KNEE ARTHROPLASTY - [place] Amber to Assist;  Surgeon: Kathlynn Sharper, MD;  Location: [facility] ORS;  Service: Orthopedics;  Laterality: Right;   WEDGE RESECTION, LUNG, ROBOT-ASSISTED, THORACOSCOPIC Right 05/09/2024   Procedure: WEDGE RESECTION, LUNG RIGHT UPPER LOBE, ROBOT-ASSISTED, THORACOSCOPIC;  Surgeon: Kerrin Elspeth BROCKS, MD;  Location: [facility] OR;  Service: Thoracic;  Laterality: Right;     Social History:   reports that he quit smoking about 25 years ago. His smoking use included cigarettes. He started smoking about 45 years ago. He has a 63.9 pack-year smoking history. He has been exposed to tobacco smoke. He has never used smokeless tobacco. He reports that he does not currently use alcohol  after a past usage of about 3.0 standard drinks of alcohol  per week. He reports that he does not  currently use drugs after having used the following drugs: Cocaine and Marijuana.   Family History:  His family history includes Depression in his mother; Early death in his father; Heart attack in his father; Heart disease in his father and mother; Hypertension in his father, mother, and sister; Osteoarthritis in his mother; Parkinson's disease  in his maternal grandfather. There is no history of Prostate cancer, Bladder Cancer, Kidney cancer, or Tremor.   Allergies Allergies  Allergen Reactions   Asenapine Other (See Comments) and Nausea And Vomiting    Increased tremors   Dextromethorphan Hbr    Guaifenesin     Latuda [Lurasidone Hcl] Other (See Comments)    Tremors     Lurasidone Other (See Comments) and Hives    Other Reaction(s): Angioedema   Phenylephrine       Home Medications  Prior to Admission medications   Medication Sig Start Date End Date Taking? Authorizing Provider  acetaminophen  (TYLENOL ) 325 MG tablet Take 1 tablet (325 mg total) by mouth every 6 (six) hours as needed for mild pain (pain score 1-3) (or temp > 100.5). 09/01/23  Yes [name], MD  albuterol  (VENTOLIN  HFA) [phone] Base) MCG/ACT inhaler Inhale 2 puffs into the lungs every 6 (six) hours as needed for wheezing or shortness of breath. 12/19/23  Yes Lorren Greig PARAS, NP  aspirin  EC 81 MG tablet Take 1 tablet (81 mg total) by mouth daily. Swallow whole. 02/03/23  Yes Evelena, Nadir, MD  atorvastatin  (LIPITOR ) 80 MG tablet TAKE 1 TABLET BY MOUTH EVERY DAY 01/15/24  Yes [name], MD  budesonide -glycopyrrolate -formoterol  (BREZTRI  AEROSPHERE) 160-9-4.8 MCG/ACT AERO inhaler Inhale 2 puffs into the lungs in the morning and at bedtime. 12/19/23  Yes Lorren, Amy J, NP  celecoxib  (CELEBREX ) 100 MG capsule TAKE 2 CAPSULES BY MOUTH TWICE A DAY 04/25/24  Yes Georgina Ozell LABOR, MD  divalproex  (DEPAKOTE ) 500 MG DR tablet Take half tablet in the morning and 3 tablets at bedtime Patient taking differently: Take half  tablet in the morning, 1 in the afternoon, and 2 tablets at bedtime 04/25/24  Yes Izella Ismael NOVAK, MD  DULoxetine  (CYMBALTA ) 30 MG capsule Take 3 capsules (90 mg total) by mouth daily. Patient taking differently: Take 30 mg by mouth 3 (three) times daily. 04/25/24 06/24/24 Yes Izella Ismael NOVAK, MD  gabapentin  (NEURONTIN ) 600 MG tablet Take 1 tablet (600 mg total) by mouth daily AND 2 tablets (1,200 mg total) at bedtime. 04/25/24  Yes Izella Ismael NOVAK, MD  hydrOXYzine  (ATARAX ) 25 MG tablet Take 1 tablet (25 mg total) by mouth 2 (two) times daily as needed. Patient taking differently: Take 25 mg by mouth daily. 04/25/24  Yes Izella Ismael NOVAK, MD  isosorbide  mononitrate (IMDUR ) 30 MG 24 hr tablet TAKE 1 TABLET BY MOUTH 2 TIMES DAILY. 04/09/24  Yes Fernand Denyse LABOR, MD  Multiple Vitamins-Minerals (MENS 50+ MULTIVITAMIN) TABS Take 1 tablet by mouth in the morning.   Yes [provider]  nicotine  polacrilex (NICOTINE  MINI) 2 MG lozenge Take 1 lozenge (2 mg total) by mouth every 2 (two) hours as needed for smoking cessation. 03/26/24 06/24/24 Yes Dgayli, Belva, MD  nitroGLYCERIN  (NITROSTAT ) 0.4 MG SL tablet Place 1 tablet (0.4 mg total) under the tongue every 5 (five) minutes as needed for chest pain. 02/03/23 05/06/24 Yes Evelena Figures, MD  pantoprazole  (PROTONIX ) 40 MG tablet Take 1 tablet (40 mg total) by mouth daily. 12/19/23  Yes Lorren, Amy J, NP  QUEtiapine  (SEROQUEL ) 200 MG tablet Take 1 tablet (200 mg total) by mouth at bedtime. 04/25/24  Yes Izella Ismael NOVAK, MD  traZODone  (DESYREL ) 100 MG tablet Take 0.5 tablets (50 mg total) by mouth at bedtime for 7 days. Patient not taking: Reported on 05/06/2024 04/25/24 05/02/24  Izella Ismael NOVAK, MD  My critical care time: 50 minutes   05/23/2024, 12:59 PM  Tamela Stakes, MD  Attending Physician, Critical Care Medicine [facility] See Amion for pager If no response to pager, please call [phone] until 7pm After 7pm,  Please call E-link [phone]

## 2024-05-23 NOTE — Progress Notes (Signed)
 PT Cancellation Note  Patient Details Name: Keith Gibbs MRN: [id number] DOB: [birth year]   Cancelled Treatment:    Reason Eval/Treat Not Completed: Patient not medically ready  Order acknowledged for PT evaluation. Pt intubated, sedated, now on ECMO support. Will monitor for a couple of days to determine readiness for PT evaluation. Please send me a secure chat if urgent evaluation requested.  Leontine Roads, PT, DPT [facility]  Rehabilitation Services Physical Therapist Office: [phone] Website: [email or web address]   Leontine GORMAN Roads 05/23/2024, 7:22 AM

## 2024-05-23 NOTE — Progress Notes (Signed)
 1 Day Post-Op Procedure(s) (LRB): ECMO CANNULATION (N/A) Subjective: Intubated, sedated, woke up agitated earlier- resedated  Objective: Vital signs in last 24 hours: Temp:  [98.6 F (37 C)-101.5 F (38.6 C)] 99 F (37.2 C) (10/30 0700) Pulse Rate:  [51-99] 61 (10/30 0700) Cardiac Rhythm: Sinus bradycardia (10/29 2000) Resp:  [0-65] 61 (10/30 0700) BP: (84-155)/(51-94) 113/62 (10/30 0400) SpO2:  [84 %-100 %] 89 % (10/30 0700) Arterial Line BP: (93-173)/(40-68) 139/44 (10/30 0700) FiO2 (%):  [60 %-80 %] 60 % (10/30 0400) Weight:  [109.3 kg] 109.3 kg (10/30 0500)  Hemodynamic parameters for last 24 hours: CVP:  [2 mmHg-21 mmHg] 16 mmHg  Intake/Output from previous day: 10/29 0701 - 10/30 0700 In: 3791.4 [I.V.:1501.6; NG/GT:1180; IV Piggyback:1109.8] Out: 2425 [Urine:2275; Chest Tube:150] Intake/Output this shift: No intake/output data recorded.  General appearance: intubated, sedated Heart: regular rate and rhythm Lungs: coarse BS bilaterally No air leak  Lab Results: Recent Labs    05/22/24 2243 05/23/24 0445 05/23/24 0453  WBC 23.7* 26.7*  --   HGB 9.4* 8.9* 8.5*  HCT 29.5* 28.0* 25.0*  PLT 243 263  --    BMET:  Recent Labs    05/22/24 1710 05/22/24 1904 05/23/24 0445 05/23/24 0453  NA 143   < > 146* 145  K 5.3*   < > 5.3* 5.2*  CL 104  --  106  --   CO2 29  --  29  --   GLUCOSE 200*  --  113*  --   BUN 51*  --  46*  --   CREATININE 1.11  --  0.88  --   CALCIUM  7.5*  --  7.5*  --    < > = values in this interval not displayed.    PT/INR:  Recent Labs    05/23/24 0445  LABPROT 15.3*  INR 1.1   ABG    Component Value Date/Time   PHART 7.402 05/23/2024 0453   HCO3 32.3 (H) 05/23/2024 0453   TCO2 34 (H) 05/23/2024 0453   ACIDBASEDEF 1.0 05/19/2024 1154   O2SAT 93 05/23/2024 0453   CBG (last 3)  Recent Labs    05/22/24 2324 05/23/24 0335 05/23/24 0743  GLUCAP 180* 122* 117*    Assessment/Plan: S/P Procedure(s) (LRB): ECMO  CANNULATION (N/A) Day 2 VV ECMO NEURO- agitation when sedation lightened- unsurprising given setting CV- CVP 5, off norepi RESP- VDRF with severe ARDS and escalating vent support  VV ECMO started yesterday  4.4 L/min flow Vent decreased to low tidal volume- air leak improved pO2 68- can increase tidal volumes on vent if necessary CxR- no significant change  Rounds with AHF + CCM   Change from heparin  to bivalrudin ID_ WBC remains elevated - infection + steroids  On Vanco, meropenem, micafungin RENAL- creatinine normal, BUN down slightly ENDO- CBG mildly elevated GI- on tube feedings   LOS: 14 days    Keith Gibbs 05/23/2024

## 2024-05-23 NOTE — CV Procedure (Signed)
 ECMO NOTE:   Indication: Respiratory failure due to ARDS   Initial cannulation date: 05/22/24   ECMO type: VV ECMO   Dual lumen inflow/return cannula:   1) 36F [place] placed in RIJ    Daily data:   Flow 4.43L RPM 3415 Sweep  8L   Labs:   ABG    Component Value Date/Time   PHART 7.432 05/23/2024 1233   PCO2ART 47.0 05/23/2024 1233   PO2ART 64 (L) 05/23/2024 1233   HCO3 31.3 (H) 05/23/2024 1233   TCO2 33 (H) 05/23/2024 1233   ACIDBASEDEF 1.0 05/19/2024 1154   O2SAT 92 05/23/2024 1233    Hgb 8.9 Platelets 263k LDH 58 PTT pending Lactic acid 1.1  Plan:   Continue VV ECMO support Await lung recovery Switch heparin  to bival Continue abx/antifungals Trach on Monday (Dr. Jesus)   Toribio Fuel, MD  2:08 PM

## 2024-05-23 NOTE — Progress Notes (Addendum)
 PHARMACY - ANTICOAGULATION CONSULT NOTE  Pharmacy Consult for heparin  >> bivalirudin  Indication: VTE, VV ECMO  Allergies  Allergen Reactions   Asenapine Other (See Comments) and Nausea And Vomiting    Increased tremors   Dextromethorphan Hbr    Guaifenesin     Latuda [Lurasidone Hcl] Other (See Comments)    Tremors     Lurasidone Other (See Comments) and Hives    Other Reaction(s): Angioedema   Phenylephrine      Patient Measurements: Height: 6' 3 (190.5 cm) Weight: 109.3 kg (240 lb 15.4 oz) IBW/kg (Calculated) : 84.5  Vital Signs: Temp: 99 F (37.2 C) (10/30 0700) Temp Source: Bladder (10/30 0400) BP: 113/62 (10/30 0400) Pulse Rate: 61 (10/30 0700)  Labs: Recent Labs    05/22/24 0451 05/22/24 0746 05/22/24 1710 05/22/24 1845 05/22/24 1904 05/22/24 2243 05/23/24 0300 05/23/24 0445 05/23/24 0453  HGB 10.4*   < > 9.4*  --    < > 9.4*  --  8.9* 8.5*  HCT 32.0*   < > 30.8*  --    < > 29.5*  --  28.0* 25.0*  PLT 215  --  201  --   --  243  --  263  --   APTT  --   --  >200*  --   --  65* 58*  --   --   LABPROT  --   --  16.8*  --   --   --   --  15.3*  --   INR  --   --  1.3*  --   --   --   --  1.1  --   HEPARINUNFRC 0.25*  --  >1.10* >1.10*  --   --  0.33  --   --   CREATININE 0.98  --  1.11  --   --   --   --  0.88  --    < > = values in this interval not displayed.    Estimated Creatinine Clearance: 114.7 mL/min (by C-G formula based on SCr of 0.88 mg/dL).   Medical History: Past Medical History:  Diagnosis Date   Anginal pain    Anxiety    Arthritis    Bipolar 1 disorder (HCC)    Bursitis    CAD (coronary artery disease)    Stent placed 2017   Cancer (HCC)    Chronic pain    COPD (chronic obstructive pulmonary disease) (HCC)    Current use of long term anticoagulation    DAPT (ASA + clopidogrel )   Depression    Diverticulitis    Dyspnea    GERD (gastroesophageal reflux disease)    Grade I diastolic dysfunction    Hepatitis C 2012   No  longer has Hep C   HLD (hyperlipidemia)    Hypertension    MI (myocardial infarction) (HCC)    Polysubstance abuse (HCC)    cocaine, marijuana, ETOH   PUD (peptic ulcer disease)    S/P angioplasty with stent 06/10/2016   a.) 90% stenosis of pLAD to mLAD - 2.5 x 18 mm Xience Alpine (DES x 1) placed to pLAD   S/P PTCA (percutaneous transluminal coronary angioplasty) 12/04/2019   a.) 60% in stent restenosis of DES to pLAD; LVEF 65%.   Schizophrenia (HCC)    Stroke (HCC)    Tremors    generalized   Valvular insufficiency    a.) Mild MR, TR, PR; mild to moderate AR on 03/05/2018 TTE  Assessment: 79 yoM admitted for RUL lobectomy. Pt intubated, now having difficulty with ventilation. Placed on VV ECMO 10/29 in the evening. Pharmacy to dose IV bivalirudin .   Heparin  level 0.33 is therapeutic with heparin  running at 1700 units/hr. Hgb (8.5) and PLTs (478) are stable. No evidence of new bleeding, just some old dried blood when suctioning his ET tube.  Transitioning to bivalirudin  this morning. Will start the infusion with no bolus with ECMO cannulation. LDH 478 and fibrinogen 551 are stable.    Goal of Therapy:  Heparin  level 0.3-0.5 aPTT 50-80s Monitor platelets by anticoagulation protocol: Yes   Plan:  Stop heparin  infusion Start bivalirudin  at 0.02 mg/kg/min Check aPTT now and every 4 hours until therapeutic Monitor twice daily aPTT and CBC  Thank you for allowing pharmacy to be a part of this patient's care.   Nidia Schaffer, PharmD PGY2 Cardiology Pharmacy Resident  Please check AMION for all [facility] Pharmacy phone numbers After 10:00 PM, call Main Pharmacy [phone] 05/23/24 8:49 AM

## 2024-05-23 NOTE — Progress Notes (Signed)
 PHARMACY - ANTICOAGULATION CONSULT NOTE  Pharmacy Consult for heparin  Indication: rule out VTE, ECMO  Allergies  Allergen Reactions   Asenapine Other (See Comments) and Nausea And Vomiting    Increased tremors   Dextromethorphan Hbr    Guaifenesin     Latuda [Lurasidone Hcl] Other (See Comments)    Tremors     Lurasidone Other (See Comments) and Hives    Other Reaction(s): Angioedema   Phenylephrine      Patient Measurements: Height: 6' 3 (190.5 cm) Weight: 109.5 kg (241 lb 6.5 oz) IBW/kg (Calculated) : 84.5  Vital Signs: Temp: 98.8 F (37.1 C) (10/30 0318) Temp Source: Bladder (10/30 0000) BP: 100/53 (10/30 0318) Pulse Rate: 54 (10/30 0318)  Labs: Recent Labs    05/21/24 0421 05/21/24 1424 05/22/24 0451 05/22/24 0746 05/22/24 1710 05/22/24 1845 05/22/24 1904 05/22/24 2243 05/23/24 0300  HGB 10.3*   < > 10.4*   < > 9.4*  --  8.8* 9.4*  --   HCT 30.2*   < > 32.0*   < > 30.8*  --  26.0* 29.5*  --   PLT 177  --  215  --  201  --   --  243  --   APTT  --   --   --   --  >200*  --   --  65*  --   LABPROT  --   --   --   --  16.8*  --   --   --   --   INR  --   --   --   --  1.3*  --   --   --   --   HEPARINUNFRC 0.31  --  0.25*  --  >1.10* >1.10*  --   --  0.33  CREATININE 1.12  --  0.98  --  1.11  --   --   --   --    < > = values in this interval not displayed.    Estimated Creatinine Clearance: 91 mL/min (by C-G formula based on SCr of 1.11 mg/dL).   Medical History: Past Medical History:  Diagnosis Date   Anginal pain    Anxiety    Arthritis    Bipolar 1 disorder (HCC)    Bursitis    CAD (coronary artery disease)    Stent placed 2017   Cancer (HCC)    Chronic pain    COPD (chronic obstructive pulmonary disease) (HCC)    Current use of long term anticoagulation    DAPT (ASA + clopidogrel )   Depression    Diverticulitis    Dyspnea    GERD (gastroesophageal reflux disease)    Grade I diastolic dysfunction    Hepatitis C 2012   No longer has  Hep C   HLD (hyperlipidemia)    Hypertension    MI (myocardial infarction) (HCC)    Polysubstance abuse (HCC)    cocaine, marijuana, ETOH   PUD (peptic ulcer disease)    S/P angioplasty with stent 06/10/2016   a.) 90% stenosis of pLAD to mLAD - 2.5 x 18 mm Xience Alpine (DES x 1) placed to pLAD   S/P PTCA (percutaneous transluminal coronary angioplasty) 12/04/2019   a.) 60% in stent restenosis of DES to pLAD; LVEF 65%.   Schizophrenia (HCC)    Stroke (HCC)    Tremors    generalized   Valvular insufficiency    a.) Mild MR, TR, PR; mild to moderate AR on 03/05/2018 TTE  Assessment: 63 yoM admitted for RUL lobectomy. Pt intubated, now having difficulty with ventilation. Pharmacy to dose IV heparin  while ruling out VTE.   He is now s/p cannulation today on ECMO. Heparin  resumed post OR and heparin  level > 1.1 (heparin  boluses given in procedure) -last heparin  rate was 1700 units/hr (increased due to heparin  level of 0.25)  10/30 AM update:  Heparin  level therapeutic this morning  Goal of Therapy:  Heparin  level 0.3-0.5 units/mL Monitor platelets by anticoagulation protocol: Yes   Plan:  -Cont heparin  at 1700 units/hr -Heparin  level in 8 hours  Lynwood Mckusick, PharmD, BCPS Clinical Pharmacist Phone: [phone]

## 2024-05-24 ENCOUNTER — Inpatient Hospital Stay (HOSPITAL_COMMUNITY): Payer: MEDICAID

## 2024-05-24 ENCOUNTER — Ambulatory Visit (HOSPITAL_COMMUNITY): Payer: MEDICAID

## 2024-05-24 DIAGNOSIS — I10 Essential (primary) hypertension: Secondary | ICD-10-CM | POA: Diagnosis not present

## 2024-05-24 DIAGNOSIS — J9621 Acute and chronic respiratory failure with hypoxia: Secondary | ICD-10-CM | POA: Diagnosis not present

## 2024-05-24 DIAGNOSIS — J441 Chronic obstructive pulmonary disease with (acute) exacerbation: Secondary | ICD-10-CM | POA: Diagnosis not present

## 2024-05-24 DIAGNOSIS — J9601 Acute respiratory failure with hypoxia: Secondary | ICD-10-CM | POA: Diagnosis not present

## 2024-05-24 DIAGNOSIS — C3412 Malignant neoplasm of upper lobe, left bronchus or lung: Secondary | ICD-10-CM | POA: Diagnosis not present

## 2024-05-24 DIAGNOSIS — A419 Sepsis, unspecified organism: Secondary | ICD-10-CM | POA: Diagnosis not present

## 2024-05-24 DIAGNOSIS — Z9889 Other specified postprocedural states: Secondary | ICD-10-CM | POA: Diagnosis not present

## 2024-05-24 DIAGNOSIS — J80 Acute respiratory distress syndrome: Secondary | ICD-10-CM | POA: Diagnosis not present

## 2024-05-24 DIAGNOSIS — Z902 Acquired absence of lung [part of]: Secondary | ICD-10-CM | POA: Diagnosis not present

## 2024-05-24 DIAGNOSIS — J9622 Acute and chronic respiratory failure with hypercapnia: Secondary | ICD-10-CM | POA: Diagnosis not present

## 2024-05-24 LAB — CBC
HCT: 28.8 % — ABNORMAL LOW (ref 39.0–52.0)
Hemoglobin: 9 g/dL — ABNORMAL LOW (ref 13.0–17.0)
MCH: 32.6 pg (ref 26.0–34.0)
MCHC: 31.3 g/dL (ref 30.0–36.0)
MCV: 104.3 fL — ABNORMAL HIGH (ref 80.0–100.0)
Platelets: 268 K/uL (ref 150–400)
RBC: 2.76 MIL/uL — ABNORMAL LOW (ref 4.22–5.81)
RDW: 15.3 % (ref 11.5–15.5)
WBC: 20 K/uL — ABNORMAL HIGH (ref 4.0–10.5)
nRBC: 0.3 % — ABNORMAL HIGH (ref 0.0–0.2)

## 2024-05-24 LAB — POCT I-STAT 7, (LYTES, BLD GAS, ICA,H+H)
Acid-Base Excess: 6 mmol/L — ABNORMAL HIGH (ref 0.0–2.0)
Acid-Base Excess: 6 mmol/L — ABNORMAL HIGH (ref 0.0–2.0)
Acid-Base Excess: 5 mmol/L — ABNORMAL HIGH (ref 0.0–2.0)
Acid-Base Excess: 3 mmol/L — ABNORMAL HIGH (ref 0.0–2.0)
Acid-Base Excess: 4 mmol/L — ABNORMAL HIGH (ref 0.0–2.0)
Bicarbonate: 31.3 mmol/L — ABNORMAL HIGH (ref 20.0–28.0)
Bicarbonate: 31.3 mmol/L — ABNORMAL HIGH (ref 20.0–28.0)
Bicarbonate: 30.5 mmol/L — ABNORMAL HIGH (ref 20.0–28.0)
Bicarbonate: 28.7 mmol/L — ABNORMAL HIGH (ref 20.0–28.0)
Bicarbonate: 30.1 mmol/L — ABNORMAL HIGH (ref 20.0–28.0)
Calcium, Ion: 1.17 mmol/L (ref 1.15–1.40)
Calcium, Ion: 1.2 mmol/L (ref 1.15–1.40)
Calcium, Ion: 1.22 mmol/L (ref 1.15–1.40)
Calcium, Ion: 1.23 mmol/L (ref 1.15–1.40)
Calcium, Ion: 1.23 mmol/L (ref 1.15–1.40)
HCT: 25 % — ABNORMAL LOW (ref 39.0–52.0)
HCT: 27 % — ABNORMAL LOW (ref 39.0–52.0)
HCT: 27 % — ABNORMAL LOW (ref 39.0–52.0)
HCT: 25 % — ABNORMAL LOW (ref 39.0–52.0)
HCT: 29 % — ABNORMAL LOW (ref 39.0–52.0)
Hemoglobin: 8.5 g/dL — ABNORMAL LOW (ref 13.0–17.0)
Hemoglobin: 9.2 g/dL — ABNORMAL LOW (ref 13.0–17.0)
Hemoglobin: 9.2 g/dL — ABNORMAL LOW (ref 13.0–17.0)
Hemoglobin: 8.5 g/dL — ABNORMAL LOW (ref 13.0–17.0)
Hemoglobin: 9.9 g/dL — ABNORMAL LOW (ref 13.0–17.0)
O2 Saturation: 92 %
O2 Saturation: 92 %
O2 Saturation: 92 %
O2 Saturation: 91 %
O2 Saturation: 90 %
Patient temperature: 37.1
Patient temperature: 37.1
Patient temperature: 37
Patient temperature: 37.1
Patient temperature: 37
Potassium: 5 mmol/L (ref 3.5–5.1)
Potassium: 4.7 mmol/L (ref 3.5–5.1)
Potassium: 4.7 mmol/L (ref 3.5–5.1)
Potassium: 4.7 mmol/L (ref 3.5–5.1)
Potassium: 4.6 mmol/L (ref 3.5–5.1)
Sodium: 147 mmol/L — ABNORMAL HIGH (ref 135–145)
Sodium: 147 mmol/L — ABNORMAL HIGH (ref 135–145)
Sodium: 148 mmol/L — ABNORMAL HIGH (ref 135–145)
Sodium: 149 mmol/L — ABNORMAL HIGH (ref 135–145)
Sodium: 149 mmol/L — ABNORMAL HIGH (ref 135–145)
TCO2: 33 mmol/L — ABNORMAL HIGH (ref 22–32)
TCO2: 33 mmol/L — ABNORMAL HIGH (ref 22–32)
TCO2: 32 mmol/L (ref 22–32)
TCO2: 30 mmol/L (ref 22–32)
TCO2: 32 mmol/L (ref 22–32)
pCO2 arterial: 46.3 mmHg (ref 32–48)
pCO2 arterial: 46.4 mmHg (ref 32–48)
pCO2 arterial: 48.1 mmHg — ABNORMAL HIGH (ref 32–48)
pCO2 arterial: 46.6 mmHg (ref 32–48)
pCO2 arterial: 51.1 mmHg — ABNORMAL HIGH (ref 32–48)
pH, Arterial: 7.438 (ref 7.35–7.45)
pH, Arterial: 7.438 (ref 7.35–7.45)
pH, Arterial: 7.411 (ref 7.35–7.45)
pH, Arterial: 7.398 (ref 7.35–7.45)
pH, Arterial: 7.378 (ref 7.35–7.45)
pO2, Arterial: 61 mmHg — ABNORMAL LOW (ref 83–108)
pO2, Arterial: 63 mmHg — ABNORMAL LOW (ref 83–108)
pO2, Arterial: 65 mmHg — ABNORMAL LOW (ref 83–108)
pO2, Arterial: 63 mmHg — ABNORMAL LOW (ref 83–108)
pO2, Arterial: 60 mmHg — ABNORMAL LOW (ref 83–108)

## 2024-05-24 LAB — CBC WITH DIFFERENTIAL/PLATELET
Abs Immature Granulocytes: 1.56 K/uL — ABNORMAL HIGH (ref 0.00–0.07)
Basophils Absolute: 0.1 K/uL (ref 0.0–0.1)
Basophils Relative: 0 %
Eosinophils Absolute: 0.2 K/uL (ref 0.0–0.5)
Eosinophils Relative: 1 %
HCT: 29.2 % — ABNORMAL LOW (ref 39.0–52.0)
Hemoglobin: 9.1 g/dL — ABNORMAL LOW (ref 13.0–17.0)
Immature Granulocytes: 7 %
Lymphocytes Relative: 11 %
Lymphs Abs: 2.3 K/uL (ref 0.7–4.0)
MCH: 32.5 pg (ref 26.0–34.0)
MCHC: 31.2 g/dL (ref 30.0–36.0)
MCV: 104.3 fL — ABNORMAL HIGH (ref 80.0–100.0)
Monocytes Absolute: 1.6 K/uL — ABNORMAL HIGH (ref 0.1–1.0)
Monocytes Relative: 8 %
Neutro Abs: 15.5 K/uL — ABNORMAL HIGH (ref 1.7–7.7)
Neutrophils Relative %: 73 %
Platelets: 248 K/uL (ref 150–400)
RBC: 2.8 MIL/uL — ABNORMAL LOW (ref 4.22–5.81)
RDW: 15 % (ref 11.5–15.5)
Smear Review: NORMAL
WBC: 21.2 K/uL — ABNORMAL HIGH (ref 4.0–10.5)
nRBC: 0.4 % — ABNORMAL HIGH (ref 0.0–0.2)

## 2024-05-24 LAB — CULTURE, RESPIRATORY W GRAM STAIN: Gram Stain: NONE SEEN

## 2024-05-24 LAB — BASIC METABOLIC PANEL WITH GFR
Anion gap: 11 (ref 5–15)
Anion gap: 11 (ref 5–15)
BUN: 50 mg/dL — ABNORMAL HIGH (ref 8–23)
BUN: 48 mg/dL — ABNORMAL HIGH (ref 8–23)
CO2: 29 mmol/L (ref 22–32)
CO2: 28 mmol/L (ref 22–32)
Calcium: 8.2 mg/dL — ABNORMAL LOW (ref 8.9–10.3)
Calcium: 8.3 mg/dL — ABNORMAL LOW (ref 8.9–10.3)
Chloride: 107 mmol/L (ref 98–111)
Chloride: 110 mmol/L (ref 98–111)
Creatinine, Ser: 0.84 mg/dL (ref 0.61–1.24)
Creatinine, Ser: 0.77 mg/dL (ref 0.61–1.24)
GFR, Estimated: >60 mL/min (ref >60)
GFR, Estimated: >60 mL/min (ref >60)
Glucose, Bld: 109 mg/dL — ABNORMAL HIGH (ref 70–99)
Glucose, Bld: 125 mg/dL — ABNORMAL HIGH (ref 70–99)
Potassium: 5.1 mmol/L (ref 3.5–5.1)
Potassium: 4.7 mmol/L (ref 3.5–5.1)
Sodium: 147 mmol/L — ABNORMAL HIGH (ref 135–145)
Sodium: 149 mmol/L — ABNORMAL HIGH (ref 135–145)

## 2024-05-24 LAB — GLUCOSE, CAPILLARY
Glucose-Capillary: 117 mg/dL — ABNORMAL HIGH (ref 70–99)
Glucose-Capillary: 123 mg/dL — ABNORMAL HIGH (ref 70–99)
Glucose-Capillary: 142 mg/dL — ABNORMAL HIGH (ref 70–99)
Glucose-Capillary: 140 mg/dL — ABNORMAL HIGH (ref 70–99)
Glucose-Capillary: 148 mg/dL — ABNORMAL HIGH (ref 70–99)
Glucose-Capillary: 180 mg/dL — ABNORMAL HIGH (ref 70–99)

## 2024-05-24 LAB — APTT
aPTT: 39 s — ABNORMAL HIGH (ref 24–36)
aPTT: 44 s — ABNORMAL HIGH (ref 24–36)
aPTT: 45 s — ABNORMAL HIGH (ref 24–36)
aPTT: 52 s — ABNORMAL HIGH (ref 24–36)
aPTT: 63 s — ABNORMAL HIGH (ref 24–36)

## 2024-05-24 LAB — CYTOLOGY - NON PAP

## 2024-05-24 LAB — HEPATIC FUNCTION PANEL
ALT: 50 U/L — ABNORMAL HIGH (ref 0–44)
AST: 56 U/L — ABNORMAL HIGH (ref 15–41)
Albumin: 1.9 g/dL — ABNORMAL LOW (ref 3.5–5.0)
Alkaline Phosphatase: 92 U/L (ref 38–126)
Bilirubin, Direct: 0.2 mg/dL (ref 0.0–0.2)
Indirect Bilirubin: 0.5 mg/dL (ref 0.3–0.9)
Total Bilirubin: 0.7 mg/dL (ref 0.0–1.2)
Total Protein: 5.3 g/dL — ABNORMAL LOW (ref 6.5–8.1)

## 2024-05-24 LAB — CULTURE, BLOOD (ROUTINE X 2): Culture: NO GROWTH

## 2024-05-24 LAB — FIBRINOGEN: Fibrinogen: 548 mg/dL — ABNORMAL HIGH (ref 210–475)

## 2024-05-24 LAB — PROTIME-INR
INR: 1.3 — ABNORMAL HIGH (ref 0.8–1.2)
Prothrombin Time: 17 s — ABNORMAL HIGH (ref 11.4–15.2)

## 2024-05-24 LAB — PHOSPHORUS: Phosphorus: 3.7 mg/dL (ref 2.5–4.6)

## 2024-05-24 LAB — CG4 I-STAT (LACTIC ACID): Lactic Acid, Venous: 1.1 mmol/L (ref 0.5–1.9)

## 2024-05-24 LAB — LACTATE DEHYDROGENASE: LDH: 453 U/L — ABNORMAL HIGH (ref 98–192)

## 2024-05-24 LAB — MAGNESIUM: Magnesium: 3 mg/dL — ABNORMAL HIGH (ref 1.7–2.4)

## 2024-05-24 MED ORDER — SORBITOL 70 % SOLN
60.0000 mL | Freq: Once | Status: AC
  Administered 2024-05-24: 60 mL
  Filled 2024-05-24: qty 60

## 2024-05-24 MED ORDER — QUETIAPINE FUMARATE 100 MG PO TABS
200.0000 mg | ORAL_TABLET | Freq: Two times a day (BID) | ORAL | Status: DC
  Administered 2024-05-24 – 2024-05-26 (×5): 200 mg
  Filled 2024-05-24 (×5): qty 2

## 2024-05-24 MED ORDER — NICARDIPINE HCL IN NACL 40-0.83 MG/200ML-% IV SOLN
3.0000 mg/h | INTRAVENOUS | Status: DC
  Administered 2024-05-24: 3 mg/h via INTRAVENOUS
  Filled 2024-05-24: qty 200

## 2024-05-24 MED ORDER — ROCURONIUM BROMIDE 10 MG/ML (PF) SYRINGE
100.0000 mg | PREFILLED_SYRINGE | INTRAVENOUS | Status: DC | PRN
  Administered 2024-05-24: 100 mg via INTRAVENOUS
  Filled 2024-05-24: qty 10

## 2024-05-24 MED ORDER — OXYCODONE HCL 5 MG PO TABS
10.0000 mg | ORAL_TABLET | Freq: Four times a day (QID) | ORAL | Status: DC
  Administered 2024-05-24 – 2024-05-27 (×13): 10 mg
  Filled 2024-05-24 (×13): qty 2

## 2024-05-24 MED ORDER — FREE WATER
150.0000 mL | Freq: Four times a day (QID) | Status: DC
  Administered 2024-05-24 – 2024-05-27 (×11): 150 mL

## 2024-05-24 MED ORDER — KETAMINE BOLUS VIA INFUSION
0.1000 mg/kg | Freq: Once | INTRAVENOUS | Status: AC
  Administered 2024-05-24: 10.78 mg via INTRAVENOUS
  Filled 2024-05-24: qty 15

## 2024-05-24 MED ORDER — IPRATROPIUM-ALBUTEROL 0.5-2.5 (3) MG/3ML IN SOLN: 3.0000 mL | RESPIRATORY_TRACT | Status: DC | PRN

## 2024-05-24 MED ORDER — METHYLNALTREXONE BROMIDE 12 MG/0.6ML ~~LOC~~ SOLN
12.0000 mg | Freq: Once | SUBCUTANEOUS | Status: AC
  Administered 2024-05-24: 12 mg via SUBCUTANEOUS
  Filled 2024-05-24: qty 0.6

## 2024-05-24 MED ORDER — KETAMINE HCL-SODIUM CHLORIDE 1000-0.69 MG/100ML-% IV SOLN
1.0000 mg/kg/h | INTRAVENOUS | Status: DC
Start: 2024-05-24 — End: 2024-05-28
  Administered 2024-05-24 – 2024-05-25 (×2): 0.5 mg/kg/h via INTRAVENOUS
  Administered 2024-05-26 – 2024-05-28 (×5): 1 mg/kg/h via INTRAVENOUS
  Filled 2024-05-24 (×9): qty 100

## 2024-05-24 MED ORDER — CLONAZEPAM 1 MG PO TABS
1.0000 mg | ORAL_TABLET | Freq: Two times a day (BID) | ORAL | Status: DC
  Administered 2024-05-24 – 2024-05-25 (×3): 1 mg
  Filled 2024-05-24 (×3): qty 1

## 2024-05-24 NOTE — Progress Notes (Addendum)
 PHARMACY - ANTICOAGULATION CONSULT NOTE  Pharmacy Consult for heparin  >> bivalirudin  Indication: VTE, VV ECMO (10/29 - current)  Allergies  Allergen Reactions   Asenapine Other (See Comments) and Nausea And Vomiting    Increased tremors   Dextromethorphan Hbr    Guaifenesin     Latuda [Lurasidone Hcl] Other (See Comments)    Tremors     Lurasidone Other (See Comments) and Hives    Other Reaction(s): Angioedema   Phenylephrine      Patient Measurements: Height: 6' 3 (190.5 cm) Weight: 107.8 kg (237 lb 10.5 oz) IBW/kg (Calculated) : 84.5  Vital Signs: Temp: 98.6 F (37 C) (10/31 2200) BP: 126/70 (10/31 2000) Pulse Rate: 101 (10/31 2200)  Labs: Recent Labs    05/22/24 1710 05/22/24 1845 05/22/24 1904 05/23/24 0300 05/23/24 0445 05/23/24 0453 05/23/24 1717 05/23/24 1724 05/24/24 0401 05/24/24 0408 05/24/24 0947 05/24/24 0948 05/24/24 1616 05/24/24 1700 05/24/24 2143 05/24/24 2200  HGB 9.4*  --    < >  --  8.9*   < > 8.9*   < > 9.1*   < >  --    < > 8.5* 9.0* 9.9*  --   HCT 30.8*  --    < >  --  28.0*   < > 28.4*   < > 29.2*   < >  --    < > 25.0* 28.8* 29.0*  --   PLT 201  --    < >  --  263  --  223  --  248  --   --   --   --  268  --   --   APTT >200*  --    < > 58*  --    < >  --    < > 45*  --  44*  --   --  52*  --  63*  LABPROT 16.8*  --   --   --  15.3*  --   --   --  17.0*  --   --   --   --   --   --   --   INR 1.3*  --   --   --  1.1  --   --   --  1.3*  --   --   --   --   --   --   --   HEPARINUNFRC >1.10* >1.10*  --  0.33  --   --   --   --   --   --   --   --   --   --   --   --   CREATININE 1.11  --   --   --  0.88  --  1.00  --  0.84  --   --   --   --  0.77  --   --    < > = values in this interval not displayed.    Estimated Creatinine Clearance: 125.4 mL/min (by C-G formula based on SCr of 0.77 mg/dL).   Medical History: Past Medical History:  Diagnosis Date   Anginal pain    Anxiety    Arthritis    Bipolar 1 disorder (HCC)     Bursitis    CAD (coronary artery disease)    Stent placed 2017   Cancer (HCC)    Chronic pain    COPD (chronic obstructive pulmonary disease) (HCC)    Current use of long term anticoagulation  DAPT (ASA + clopidogrel )   Depression    Diverticulitis    Dyspnea    GERD (gastroesophageal reflux disease)    Grade I diastolic dysfunction    Hepatitis C 2012   No longer has Hep C   HLD (hyperlipidemia)    Hypertension    MI (myocardial infarction) (HCC)    Polysubstance abuse (HCC)    cocaine, marijuana, ETOH   PUD (peptic ulcer disease)    S/P angioplasty with stent 06/10/2016   a.) 90% stenosis of pLAD to mLAD - 2.5 x 18 mm Xience Alpine (DES x 1) placed to pLAD   S/P PTCA (percutaneous transluminal coronary angioplasty) 12/04/2019   a.) 60% in stent restenosis of DES to pLAD; LVEF 65%.   Schizophrenia (HCC)    Stroke (HCC)    Tremors    generalized   Valvular insufficiency    a.) Mild MR, TR, PR; mild to moderate AR on 03/05/2018 TTE      Assessment: 63 yoM admitted for RUL lobectomy.  Pt intubated, now having difficulty with ventilation. Placed on VV ECMO 10/29 in the evening. Pharmacy to dose IV bivalirudin . Previously on therapeutic heparin  (HL 0.33 on 1700/h) for acute DVT of gastrocnemius vein.  Hgb (8.5) and PLTs (478) are stable. No evidence of new bleeding, just some old dried blood when suctioning his ET tube.  aPTT 52 is subtherapeutic with bivalirudin  at 0.1 mg/kg/hr.  No issues with infusion or bleeding per RN. Clots continue in filter but sweep/O2 sats ok.    Goal of Therapy:  aPTT 60-80s Monitor platelets by anticoagulation protocol: Yes   Plan:  Increase bivalirudin  to 0.14 mg/kg/hr   Check aPTT every 4 hours until therapeutic Monitor twice daily aPTT and CBC  ADDENDUM 22:50- aPTT up to 63 is therapeutic on 0.14 mg/kg/hr.  No issues with infusion or bleeding per RN. Will increase slightly to 0.15 mg/kg/hr to target middle-higher end of goal given acute  DVT and clots in filter.   Thank you for allowing pharmacy to be a part of this patient's care.   Jinnie Door, PharmD, BCPS, BCCP Clinical Pharmacist  Please check AMION for all [facility] Pharmacy phone numbers After 10:00 PM, call Main Pharmacy [phone]

## 2024-05-24 NOTE — Progress Notes (Signed)
 OT Cancellation Note  Patient Details Name: TERRANCE USERY MRN: [id number] DOB: [birth year]   Cancelled Treatment:    Reason Eval/Treat Not Completed: Patient not medically ready. Pt remains intubated, sedated and on ECMO support. Will sign off and await new order.  Kennth Mliss Helling 05/24/2024, 9:05 AM Mliss HERO, OTR/L Acute Rehabilitation Services Office: [phone]

## 2024-05-24 NOTE — Progress Notes (Signed)
 RT NOTE: patient does not meet SBT criteria for this AM.  Tolerating current ventilator settings well.  Will continue to monitor.

## 2024-05-24 NOTE — CV Procedure (Addendum)
 ECMO NOTE:   Indication: Respiratory failure due to ARDS   Initial cannulation date: 05/22/24   ECMO type: VV ECMO - Day #2   Dual lumen inflow/return cannula:   1) 47F [place] placed in RIJ    Daily data:   Flow 4.2L RPM 3270 Sweep  8L Pven -96   Labs:   ABG    Component Value Date/Time   PHART 7.378 05/24/2024 2143   PCO2ART 51.1 (H) 05/24/2024 2143   PO2ART 60 (L) 05/24/2024 2143   HCO3 30.1 (H) 05/24/2024 2143   TCO2 32 05/24/2024 2143   ACIDBASEDEF 1.0 05/19/2024 1154   O2SAT 90 05/24/2024 2143    Hgb 8.9 -> 9.1 Platelets 263k -> 248k LDH 478 -> 453 PTT 45 on bival Lactic acid 1.1  Plan:   Continue VV ECMO support Await lung recovery Increase bival with PTT goal > 60 Continue abx/antifungals Trach on Monday (Dr. Jesus) Wean paralytics/sedation as able   Toribio Fuel, MD  10:00 PM

## 2024-05-24 NOTE — Progress Notes (Signed)
 PHARMACY - ANTICOAGULATION CONSULT NOTE  Pharmacy Consult for heparin  >> bivalirudin  Indication: VTE, VV ECMO (10/29 - current)  Allergies  Allergen Reactions   Asenapine Other (See Comments) and Nausea And Vomiting    Increased tremors   Dextromethorphan Hbr    Guaifenesin     Latuda [Lurasidone Hcl] Other (See Comments)    Tremors     Lurasidone Other (See Comments) and Hives    Other Reaction(s): Angioedema   Phenylephrine      Patient Measurements: Height: 6' 3 (190.5 cm) Weight: 109.3 kg (240 lb 15.4 oz) IBW/kg (Calculated) : 84.5  Vital Signs: Temp: 98.6 F (37 C) (10/30 2338) Temp Source: Bladder (10/30 2000) BP: 111/59 (10/30 2338) Pulse Rate: 64 (10/30 2338)  Labs: Recent Labs    05/22/24 1710 05/22/24 1845 05/22/24 1904 05/22/24 2243 05/23/24 0300 05/23/24 0445 05/23/24 0453 05/23/24 1230 05/23/24 1233 05/23/24 1717 05/23/24 1724 05/23/24 1852 05/23/24 2015 05/23/24 2355  HGB 9.4*  --    < > 9.4*  --  8.9*   < >  --    < > 8.9* 8.5*  --  8.2*  --   HCT 30.8*  --    < > 29.5*  --  28.0*   < >  --    < > 28.4* 25.0*  --  24.0*  --   PLT 201  --   --  243  --  263  --   --   --  223  --   --   --   --   APTT >200*  --   --  65* 58*  --   --  35  --   --   --  36  --  39*  LABPROT 16.8*  --   --   --   --  15.3*  --   --   --   --   --   --   --   --   INR 1.3*  --   --   --   --  1.1  --   --   --   --   --   --   --   --   HEPARINUNFRC >1.10* >1.10*  --   --  0.33  --   --   --   --   --   --   --   --   --   CREATININE 1.11  --   --   --   --  0.88  --   --   --  1.00  --   --   --   --    < > = values in this interval not displayed.    Estimated Creatinine Clearance: 101 mL/min (by C-G formula based on SCr of 1 mg/dL).   Medical History: Past Medical History:  Diagnosis Date   Anginal pain    Anxiety    Arthritis    Bipolar 1 disorder (HCC)    Bursitis    CAD (coronary artery disease)    Stent placed 2017   Cancer [facility])    Chronic  pain    COPD (chronic obstructive pulmonary disease) (HCC)    Current use of long term anticoagulation    DAPT (ASA + clopidogrel )   Depression    Diverticulitis    Dyspnea    GERD (gastroesophageal reflux disease)    Grade I diastolic dysfunction    Hepatitis C 2012   No longer has Hep  C   HLD (hyperlipidemia)    Hypertension    MI (myocardial infarction) (HCC)    Polysubstance abuse (HCC)    cocaine, marijuana, ETOH   PUD (peptic ulcer disease)    S/P angioplasty with stent 06/10/2016   a.) 90% stenosis of pLAD to mLAD - 2.5 x 18 mm Xience Alpine (DES x 1) placed to pLAD   S/P PTCA (percutaneous transluminal coronary angioplasty) 12/04/2019   a.) 60% in stent restenosis of DES to pLAD; LVEF 65%.   Schizophrenia (HCC)    Stroke (HCC)    Tremors    generalized   Valvular insufficiency    a.) Mild MR, TR, PR; mild to moderate AR on 03/05/2018 TTE      Assessment: 63 yoM admitted for RUL lobectomy.  Pt intubated, now having difficulty with ventilation. Placed on VV ECMO 10/29 in the evening. Pharmacy to dose IV bivalirudin .   Previously on therapeutic heparin  (HL 0.33 on 1700/h) for acute DVT of gastrocnemius vein   Hgb (8.5) and PLTs (478) are stable. No evidence of new bleeding, just some old dried blood when suctioning his ET tube.  Transitioning to bivalirudin  this morning. Will start the infusion with no bolus with ECMO cannulation. LDH 478 and fibrinogen 551 are stable.   PM update -  aPTT 36 sec is below goal on 0.02 mg/kg/hr.  No new issues from AM rounds.   10/31 AM update:  aPTT sub-therapeutic   Goal of Therapy:  aPTT 50-80s Monitor platelets by anticoagulation protocol: Yes   Plan:  Increase bivalirudin  to 0.06 mg/kg/hr Check aPTT every 4 hours until therapeutic Monitor twice daily aPTT and CBC  Lynwood Mckusick, PharmD, BCPS Clinical Pharmacist Phone: [phone]

## 2024-05-24 NOTE — Progress Notes (Addendum)
 Advanced Heart Failure Rounding Note   Subjective:     Placed on VV ECMO day #2  Nimbex at 3 dilaudid  2 versed  14  Remains intubated/sedated/paralyzed. Vent @ FiO2 60% PEEP 10  TV 135cc  On micafungin for candida fungemia (Bcx and BAL)   VV ECMO 4.2L RPM 3270 sweep 8  + clots/fibrin in oxygenator (see ECMO flowsheet)  7.43/46/61/92%  Hgb 9.1 PLT 248 PTT 45 on bival LDH 478-> 453 LA 1.1   Objective:   Weight Range:  Vital Signs:   Temp:  [98.4 F (36.9 C)-99 F (37.2 C)] 98.8 F (37.1 C) (10/31 0830) Pulse Rate:  [51-69] 63 (10/31 0815) Resp:  [15-97] 63 (10/31 0830) BP: (94-154)/(48-71) 133/48 (10/31 0815) SpO2:  [85 %-97 %] 94 % (10/31 0818) Arterial Line BP: (115-174)/(41-61) 130/46 (10/31 0830) FiO2 (%):  [60 %] 60 % (10/31 0818) Weight:  [107.8 kg] 107.8 kg (10/31 0400) Last BM Date : 05/15/24  Weight change: Filed Weights   05/22/24 0500 05/23/24 0500 05/24/24 0400  Weight: 109.5 kg 109.3 kg 107.8 kg    Intake/Output:   Intake/Output Summary (Last 24 hours) at 05/24/2024 0902 Last data filed at 05/24/2024 0800 Gross per 24 hour  Intake 2439.01 ml  Output 4140 ml  Net -1700.99 ml     Physical Exam: General:  Intubated/sedated/paralyzed HEENT: + ETT Neck: supple. RIJ ECMO cannula Cor: Regular rate & rhythm.  Lungs: coarse Abdomen: soft, nontender, nondistended.Good bowel sounds. Extremities: no cyanosis, clubbing, rash, edema Neuro:sedated/paralyzed   Telemetry: NSR 60-70s Personally reviewed   Labs: Basic Metabolic Panel: Recent Labs  Lab 05/20/24 0500 05/20/24 1019 05/21/24 0421 05/21/24 1424 05/22/24 0451 05/22/24 0746 05/22/24 1710 05/22/24 1904 05/23/24 0445 05/23/24 0453 05/23/24 1717 05/23/24 1724 05/23/24 2015 05/24/24 0401 05/24/24 0408  NA 135   < > 139   < > 143   < > 143   < > 146*   < > 145 145 145 147* 147*  K 4.6   < > 4.7   < > 4.8   < > 5.3*   < > 5.3*   < > 5.0 5.0 4.8 5.1 5.0  CL 94*  --  100  --   100  --  104  --  106  --  106  --   --  107  --   CO2 25  --  29  --  29  --  29  --  29  --  29  --   --  29  --   GLUCOSE 185*  --  161*  --  134*  --  200*  --  113*  --  160*  --   --  109*  --   BUN 47*  --  48*  --  51*  --  51*  --  46*  --  50*  --   --  50*  --   CREATININE 1.85*  --  1.12  --  0.98  --  1.11  --  0.88  --  1.00  --   --  0.84  --   CALCIUM  7.6*  --  7.6*  --  7.9*  --  7.5*  --  7.5*  --  7.8*  --   --  8.2*  --   MG 2.6*  --  3.3*  --  3.1*  --   --   --  3.1*  --   --   --   --  3.0*  --   PHOS 7.1*  --  3.2  --  3.2  --   --   --  3.1  --   --   --   --  3.7  --    < > = values in this interval not displayed.    Liver Function Tests: Recent Labs  Lab 05/22/24 0451 05/22/24 1710 05/23/24 0445 05/24/24 0401  AST 42* 54* 31 56*  ALT 25 28 23  50*  ALKPHOS 92 79 79 92  BILITOT 0.4 0.6 0.5 0.7  PROT 5.9* 5.1* 5.2* 5.3*  ALBUMIN  1.9* 1.6* 1.8* 1.9*   No results for input(s): LIPASE, AMYLASE in the last 168 hours. No results for input(s): AMMONIA in the last 168 hours.  CBC: Recent Labs  Lab 05/20/24 0500 05/20/24 1019 05/21/24 0421 05/21/24 1424 05/22/24 0451 05/22/24 0746 05/22/24 1710 05/22/24 1904 05/22/24 2243 05/23/24 0445 05/23/24 0453 05/23/24 1717 05/23/24 1724 05/23/24 2015 05/24/24 0401 05/24/24 0408  WBC 16.2*  --  16.2*  --  19.7*  --  22.9*  --  23.7* 26.7*  --  18.9*  --   --  21.2*  --   NEUTROABS 13.3*  --  11.7*  --  13.5*  --   --   --   --  18.6*  --   --   --   --  15.5*  --   HGB 11.1*   < > 10.3*   < > 10.4*   < > 9.4*   < > 9.4* 8.9*   < > 8.9* 8.5* 8.2* 9.1* 8.5*  HCT 32.8*   < > 30.2*   < > 32.0*   < > 30.8*   < > 29.5* 28.0*   < > 28.4* 25.0* 24.0* 29.2* 25.0*  MCV 96.2  --  96.5  --  100.6*  --  105.5*  --  102.4* 102.2*  --  103.3*  --   --  104.3*  --   PLT 148*  --  177  --  215  --  201  --  243 263  --  223  --   --  248  --    < > = values in this interval not displayed.    Cardiac Enzymes: No  results for input(s): CKTOTAL, CKMB, CKMBINDEX, TROPONINI in the last 168 hours.  BNP: BNP (last 3 results) Recent Labs    05/30/23 1059 03/20/24 1419  BNP 34.3 20.6    ProBNP (last 3 results) No results for input(s): PROBNP in the last 8760 hours.    Other results:  Imaging: DG CHEST PORT 1 VIEW Result Date: 05/23/2024 CLINICAL DATA:  Intubation. EXAM: PORTABLE CHEST 1 VIEW COMPARISON:  05/23/2024 at 5:17 a.m. FINDINGS: Enteric tube courses into the region of the stomach and off the image as tip is not visualized. Endotracheal tube has tip approximately 4.8 cm above the carina. Right-sided chest tube unchanged. Left IJ central venous catheter with tip over the SVC unchanged. Surgical suture over the right perihilar region and mid lung. Moderate right-sided pneumothorax possibly slightly worse. Stable hazy opacification throughout the left lung. Left basilar opacification likely effusion with atelectasis. Stable right base opacification. Stable borderline cardiomegaly. Remainder of the exam is unchanged. IMPRESSION: 1. Moderate right-sided pneumothorax possibly slightly worse. Right-sided chest tube unchanged. 2. Stable hazy opacification throughout the left lung. Stable left base/retrocardiac opacification and stable right base opacification. 3. Tubes and lines as described. Electronically Signed   By: Toribio  Jearld M.D.   On: 05/23/2024 12:02   DG Chest Port 1 View Result Date: 05/23/2024 EXAM: 1 VIEW(S) XRAY OF THE CHEST 05/23/2024 05:15:00 AM COMPARISON: Portable chest yesterday at 5:18 pm. CLINICAL HISTORY: ARDS (adult respiratory distress syndrome) (HCC) 33510. Reason for exam: ARDS. FINDINGS: LINES, TUBES AND DEVICES: Right apical chest tube positioning, ECMO catheter extending both above and below the plane of imaging, both unchanged. ETT tip is 5.6 cm from the carina. Feeding tube extends into the stomach with the intragastric course out of view. A left IJ central line has  a tip near the superior cavoatrial junction. LUNGS AND PLEURA: Diffuse bilateral airspace disease continues to be seen, denser on the right due to the partial lung collapse. No new airspace disease is seen. There is a right pneumothorax of moderate size with 9 cm pleuroparenchymal separation previously 8 cm. There are small pleural effusions which appear similar. HEART AND MEDIASTINUM: Stable cardiomegaly. Stable mediastinum. No substantial mediastinal shift is seen. BONES AND SOFT TISSUES: No new osseous findings. IMPRESSION: 1. Moderate right pneumothorax with slight interval increase to 9 cm pleuroparenchymal separation, without substantial mediastinal shift. 2. Diffuse bilateral airspace disease, greater on the right due to partial lung collapse, without new airspace disease. Electronically signed by: Francis Quam MD 05/23/2024 07:09 AM EDT RP Workstation: [id number]   DG Chest Port 1 View Result Date: 05/22/2024 EXAM: 1 VIEW XRAY OF THE CHEST 05/22/2024 01:50:00 PM COMPARISON: Comparison is made to a study from 05/22/2024. CLINICAL HISTORY: ARDS (adult respiratory distress syndrome); PATIENT LAYING PRONE. FINDINGS: LINES, TUBES AND DEVICES: Stable right-sided chest tube. Feeding tube in her stomach. Endotracheal tube unchanged. LUNGS AND PLEURA: Stable bilateral lung opacities. Grossly stable right apical pneumothorax. No pulmonary edema. No pleural effusion. HEART AND MEDIASTINUM: No acute abnormality of the cardiac and mediastinal silhouettes. BONES AND SOFT TISSUES: No acute osseous abnormality. IMPRESSION: 1. Stable bilateral lung opacities, consistent with ARDS. 2. Stable right apical pneumothorax. 3. Stable right-sided chest tube. 4. Enteric tube appropriately positioned in the stomach. 5. Endotracheal tube unchanged in position. Electronically signed by: Lynwood Seip MD 05/22/2024 05:35 PM EDT RP Workstation: [id number]   DG Chest Port 1 View Result Date: 05/22/2024 CLINICAL DATA:  ECMO. EXAM:  PORTABLE CHEST 1 VIEW COMPARISON:  Earlier radiograph dated 05/22/2024. FINDINGS: Right-sided chest tube in similar position. No significant interval change in the size of right pneumothorax. Interval removal of the enteric tube and placement of a feeding tube. Additional support apparatus in similar position. No significant interval change in bilateral pulmonary opacities. Stable cardiomegaly. No acute osseous pathology. IMPRESSION: No significant interval change in the size of right pneumothorax or bilateral pulmonary opacities. Electronically Signed   By: Vanetta Chou M.D.   On: 05/22/2024 17:30   CARDIAC CATHETERIZATION Result Date: 05/22/2024 Successful VV ECMO placement.   DG Abd Portable 1V Result Date: 05/22/2024 EXAM: 1 VIEW XRAY OF THE ABDOMEN 05/22/2024 10:07:17 AM COMPARISON: 05/20/2024 CLINICAL HISTORY: feeding tube placement ,? Post pyloric; rover FINDINGS: LINES, TUBES AND DEVICES: Feeding tube in place with tip terminating over the distal stomach. BOWEL: Nonobstructive bowel gas pattern. SOFT TISSUES: No opaque urinary calculi. BONES: Lumbosacral spine fixation hardware and intervertebral disc spacers noted. IMPRESSION: 1. Feeding tube in place with the tip terminating over the distal stomach. Electronically signed by: Donnice Mania MD 05/22/2024 11:02 AM EDT RP Workstation: [id number]     Medications:     Scheduled Medications:  arformoterol  15 mcg Nebulization BID   artificial tears  1 Application  Both Eyes Q8H   budesonide  (PULMICORT ) nebulizer solution  0.25 mg Nebulization BID   Chlorhexidine  Gluconate Cloth  6 each Topical Daily   feeding supplement (PROSource TF20)  60 mL Per Tube QID   furosemide  40 mg Intravenous Daily   gabapentin   600 mg Per Tube Daily   And   gabapentin   1,200 mg Per Tube QHS   insulin aspart  0-15 Units Subcutaneous Q4H   ipratropium-albuterol   3 mL Nebulization Q4H   lactulose  10 g Per Tube TID   mouth rinse  15 mL Mouth Rinse Q2H    pantoprazole  (PROTONIX ) IV  40 mg Intravenous QHS   polyethylene glycol  17 g Per Tube Daily   QUEtiapine   100 mg Per Tube Daily   QUEtiapine   200 mg Per Tube QHS   revefenacin  175 mcg Nebulization Daily   senna-docusate  2 tablet Per Tube QHS   sodium chloride  flush  10-40 mL Intracatheter Q12H    Infusions:  albumin  human     bivalirudin  (ANGIOMAX ) 250 mg in sodium chloride  0.9 % 500 mL (0.5 mg/mL) infusion 0.075 mg/kg/hr (05/24/24 0800)   cisatracurium (NIMBEX) 200 mg in sodium chloride  0.9 % 100 mL (2 mg/mL) infusion 3 mcg/kg/min (05/24/24 0800)   feeding supplement (VITAL 1.5 CAL) 20 mL/hr at 05/24/24 0800   HYDROmorphone  2 mg/hr (05/24/24 0800)   meropenem (MERREM) IV Stopped (05/24/24 0617)   micafungin (MYCAMINE) 200 mg in sodium chloride  0.9 % 100 mL IVPB Stopped (05/23/24 1806)   midazolam  14 mg/hr (05/24/24 0800)   norepinephrine (LEVOPHED) Adult infusion Stopped (05/23/24 2346)    PRN Medications: albumin  human, hydrALAZINE , HYDROmorphone , hydrOXYzine , lactulose, midazolam , ondansetron  (ZOFRAN ) IV, mouth rinse, mouth rinse, sodium chloride  flush   Assessment/Plan:   1. 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 - s/p VV ECMO cannulation 05/22/24 - VV ECMO flows look good. + clots in oxygenator -> increase bival goal to keep PTT > 60 - Oxygenation remains marginal but adequate - CXR unchanged - CCM managing vent and R chest tube - Plan trach Monday - Need to wean paralytics and sedation as quickly as possible to avoid ICU myopathy - continue abx. Diureses as needed - Transfuse hgb < 7.5 2. RUL lung CA s/p lobectomy 3. COPD with ongoing tobacco use 4. Bipolar d/o 5. CAD 6. Candida glabrata fungemia/PNA - ID following - on micafungin - steroids off 7. HTN - ensure adequate sedation under paralytics - add cardene as needed  Remains critically ill with ARDS/fungal PNA/PTX on top of severe underlying lung disease. Continue  rest settings on vent per CCM. Plan trach Monday with ENT   CRITICAL CARE Performed by: Cherrie Sieving  Total critical care time: 55 minutes  Critical care time was exclusive of separately billable procedures and treating other patients.  Critical care was necessary to treat or prevent imminent or life-threatening deterioration.  Critical care was time spent personally by me (independent of midlevel providers or residents) on the following activities: development of treatment plan with patient and/or surrogate as well as nursing, discussions with consultants, evaluation of patient's response to treatment, examination of patient, obtaining history from patient or surrogate, ordering and performing treatments and interventions, ordering and review of laboratory studies, ordering and review of radiographic studies, pulse oximetry and re-evaluation of patient's condition.  Length of Stay: 15   Evann Koelzer 05/24/2024, 9:02 AM  Advanced Heart Failure Team Pager [phone] (M-F; 7a - 4p)  Please contact [facility] for  night-coverage after hours (4p -7a ) and weekends on [email or web address]

## 2024-05-24 NOTE — Progress Notes (Addendum)
      [address] [employer] [address]             [phone]         2 Days Post-Op Procedure(s) (LRB): ECMO CANNULATION (N/A)  Subjective:  Remains intubated, sedated on VV ECMO  Objective: Vital signs in last 24 hours: Temp:  [98.4 F (36.9 C)-99 F (37.2 C)] 98.8 F (37.1 C) (10/31 0830) Pulse Rate:  [51-69] 63 (10/31 0815) Cardiac Rhythm: Normal sinus rhythm (10/31 0800) Resp:  [15-97] 63 (10/31 0830) BP: (94-154)/(48-71) 133/48 (10/31 0815) SpO2:  [85 %-97 %] 94 % (10/31 0818) Arterial Line BP: (115-174)/(41-61) 130/46 (10/31 0830) FiO2 (%):  [60 %] 60 % (10/31 0818) Weight:  [107.8 kg] 107.8 kg (10/31 0400)  Hemodynamic parameters for last 24 hours: CVP:  [3 mmHg-12 mmHg] 3 mmHg  Intake/Output from previous day: 10/30 0701 - 10/31 0700 In: 2528.9 [I.V.:955.1; NG/GT:916.7; IV Piggyback:657.1] Out: 4120 [Urine:4090; Chest Tube:30] Intake/Output this shift: Total I/O In: 171.7 [I.V.:87.8; NG/GT:30; IV Piggyback:53.9] Out: 195 [Urine:195]  General appearance: intubated, sedated on VV ECMO Heart: regular rate and rhythm Lungs: coarse throughout  Lab Results: Recent Labs    05/23/24 1717 05/23/24 1724 05/24/24 0401 05/24/24 0408  WBC 18.9*  --  21.2*  --   HGB 8.9*   < > 9.1* 8.5*  HCT 28.4*   < > 29.2* 25.0*  PLT 223  --  248  --    < > = values in this interval not displayed.   BMET:  Recent Labs    05/23/24 1717 05/23/24 1724 05/24/24 0401 05/24/24 0408  NA 145   < > 147* 147*  K 5.0   < > 5.1 5.0  CL 106  --  107  --   CO2 29  --  29  --   GLUCOSE 160*  --  109*  --   BUN 50*  --  50*  --   CREATININE 1.00  --  0.84  --   CALCIUM  7.8*  --  8.2*  --    < > = values in this interval not displayed.    PT/INR:  Recent Labs    05/24/24 0401  LABPROT 17.0*  INR 1.3*   ABG    Component Value Date/Time   PHART 7.438 05/24/2024 0408   HCO3 31.3 (H) 05/24/2024 0408   TCO2 33 (H) 05/24/2024 0408   ACIDBASEDEF  1.0 05/19/2024 1154   O2SAT 92 05/24/2024 0408   CBG (last 3)  Recent Labs    05/23/24 2347 05/24/24 0406 05/24/24 0804  GLUCAP 92 117* 123*    Assessment/Plan: S/P Procedure(s) (LRB): ECMO CANNULATION (N/A)  CV- VV ECMO- per AHF Pulm-S/P Lobectomy for lung CA, remains sedated on vent due ARDS.SABRAper CCM.  ENT has been consulted for trach placement, scheduled for Monday afternoon with Dr. Jesus ID- BAL/Blood cultures growing fungal species.. ID has been consulted continue ABX, Anti-fungals Renal- creatinine/lytes stable  Patient remains critically ill, continue ICU Care   LOS: 15 days    Rocky Shad, PA-C 05/24/2024 9:11 AM  Agree with above.  Remsins paralyzed and sedated on vent and VV ECMO. Planning trach Monday by ENT.

## 2024-05-24 NOTE — H&P (View-Only) (Signed)
 Reason for Consult: Tracheostomy Referring Physician: Kerrin Elspeth BROCKS, MD  Keith Gibbs is an 63 y.o. male.  HPI: Patient on ECMO, being treated for ARDS following chest surgery.  Anticipating prolonged ventilatory support.  No other pertinent history and no family around.  Past Medical History:  Diagnosis Date   Anginal pain    Anxiety    Arthritis    Bipolar 1 disorder (HCC)    Bursitis    CAD (coronary artery disease)    Stent placed 2017   [facility])    Chronic pain    COPD (chronic obstructive pulmonary disease) (HCC)    Current use of long term anticoagulation    DAPT (ASA + clopidogrel )   Depression    Diverticulitis    Dyspnea    GERD (gastroesophageal reflux disease)    Grade I diastolic dysfunction    Hepatitis C 2012   No longer has Hep C   HLD (hyperlipidemia)    Hypertension    MI (myocardial infarction) (HCC)    Polysubstance abuse (HCC)    cocaine, marijuana, ETOH   PUD (peptic ulcer disease)    S/P angioplasty with stent 06/10/2016   a.) 90% stenosis of pLAD to mLAD - 2.5 x 18 mm Xience Alpine (DES x 1) placed to pLAD   S/P PTCA (percutaneous transluminal coronary angioplasty) 12/04/2019   a.) 60% in stent restenosis of DES to pLAD; LVEF 65%.   Schizophrenia (HCC)    Stroke (HCC)    Tremors    generalized   Valvular insufficiency    a.) Mild MR, TR, PR; mild to moderate AR on 03/05/2018 TTE    Past Surgical History:  Procedure Laterality Date   ABDOMINAL SURGERY     removed small piece of intestines due to [facility] Diverticulosis   ANTERIOR LAT LUMBAR FUSION N/A 08/23/2022   Procedure: DIRECT LATERAL INTERBODY FUSION  LUMBAR TWO- LUMBAR THREE, LUMBAR THREE-LUMBAR FOUR , EXPLORE AND EXTEND FUSION LUMBAR TWO-LUMBAR FIVE, POSTERIOR DECOMPRESSION LUMBAR THREE-LUMBAR FOUR, LEFT LUMBAR TWO-LUMBAR THREE;  Surgeon: Debby Dorn MATSU, MD;  Location: [facility] OR;  Service: Neurosurgery;  Laterality: N/A;   APPENDECTOMY     BACK SURGERY     CARDIAC  CATHETERIZATION Left 06/10/2016   Procedure: Left Heart Cath and Coronary Angiography;  Surgeon: Denyse DELENA Bathe, MD;  Location: [facility] INVASIVE CV LAB;  Service: Cardiovascular;  Laterality: Left;   CARDIAC CATHETERIZATION N/A 06/10/2016   Procedure: Coronary Stent Intervention;  Surgeon: Cara JONETTA Lovelace, MD;  Location: [facility] INVASIVE CV LAB;  Service: Cardiovascular;  Laterality: N/A;   CHOLECYSTECTOMY N/A 02/17/2022   Procedure: LAPAROSCOPIC CHOLECYSTECTOMY;  Surgeon: Stevie Herlene Righter, MD;  Location: [facility] OR;  Service: General;  Laterality: N/A;   COLON SURGERY     COLONOSCOPY     COLONOSCOPY WITH PROPOFOL  N/A 01/05/2017   Procedure: COLONOSCOPY WITH PROPOFOL ;  Surgeon: Therisa Bi, MD;  Location: [facility] ENDOSCOPY;  Service: Endoscopy;  Laterality: N/A;   COLONOSCOPY WITH PROPOFOL  N/A 02/13/2020   Procedure: COLONOSCOPY WITH PROPOFOL ;  Surgeon: Janalyn Keene NOVAK, MD;  Location: [facility] ENDOSCOPY;  Service: Endoscopy;  Laterality: N/A;   CORONARY ANGIOPLASTY WITH STENT PLACEMENT     CORONARY PRESSURE/FFR STUDY N/A 12/04/2019   Procedure: INTRAVASCULAR PRESSURE WIRE/FFR STUDY;  Surgeon: Wonda Sharper, MD;  Location: [facility] INVASIVE CV LAB;  Service: Cardiovascular;  Laterality: N/A;   ECMO CANNULATION N/A 05/22/2024   Procedure: ECMO CANNULATION;  Surgeon: Cherrie Toribio SAUNDERS, MD;  [facility] IN[facility] LAB;  Service: Cardiovascular;  Laterality: N/A;   ESOPHAGOGASTRODUODENOSCOPY (EGD) WITH PROPOFOL  N/A 01/05/2017   Procedure: ESOPHAGOGASTRODUODENOSCOPY (EGD) WITH PROPOFOL ;  Surgeon: Therisa Bi, MD;  Location: [facility] ENDOSCOPY;  Service: Endoscopy;  Laterality: N/A;   ESOPHAGOGASTRODUODENOSCOPY (EGD) WITH PROPOFOL  N/A 02/13/2020   Procedure: ESOPHAGOGASTRODUODENOSCOPY (EGD) WITH PROPOFOL ;  Surgeon: Janalyn Keene NOVAK, MD;  Location: [facility] ENDOSCOPY;  Service: Endoscopy;  Laterality: N/A;   INTERCOSTAL NERVE BLOCK  05/09/2024   Procedure: BLOCK, NERVE, INTERCOSTAL;  Surgeon: Kerrin Elspeth BROCKS, MD;  Location: [facility] OR;  Service: Thoracic;;   KNEE ARTHROSCOPY WITH MEDIAL MENISECTOMY Right 09/05/2017   Procedure: KNEE ARTHROSCOPY WITH MEDIAL AND LATERAL  MENISECTOMY PARTIAL SYNOVECTOMY;  Surgeon: Kathlynn Sharper, MD;  Location: [facility] ORS;  Service: Orthopedics;  Laterality: Right;   LEFT HEART CATH AND CORONARY ANGIOGRAPHY N/A 12/04/2019   Procedure: LEFT HEART CATH AND CORONARY ANGIOGRAPHY;  Surgeon: Wonda Sharper, MD;  Location: [facility] INVASIVE CV LAB;  Service: Cardiovascular;  Laterality: N/A;   LEFT HEART CATH AND CORONARY ANGIOGRAPHY Left 11/25/2022   Procedure: LEFT HEART CATH AND CORONARY ANGIOGRAPHY;  Surgeon: Fernand Denyse LABOR, MD;  Location: [facility] INVASIVE CV LAB;  Service: Cardiovascular;  Laterality: Left;   LOBECTOMY, LUNG, ROBOT-ASSISTED, USING VATS Right 05/09/2024   Procedure: LOBECTOMY, LUNG RIGHT UPPER LOBE, ROBOT-ASSISTED, USING VATS;  Surgeon: Kerrin Elspeth BROCKS, MD;  Location: [facility] OR;  Service: Thoracic;  Laterality: Right;   LYMPH NODE BIOPSY  05/09/2024   Procedure: LYMPH NODE BIOPSY;  Surgeon: Kerrin Elspeth BROCKS, MD;  Location: [facility] OR;  Service: Thoracic;;   REVERSE SHOULDER ARTHROPLASTY Right 08/31/2023   Procedure: RIGHT REVERSE SHOULDER ARTHROPLASTY;  Surgeon: [name], MD;  [facility] OR;  Service: Orthopedics;  Laterality: Right;   SHOULDER SURGERY Right 04/09/2012   SPINE SURGERY     TOTAL KNEE ARTHROPLASTY Right 01/19/2021   Procedure: TOTAL KNEE ARTHROPLASTY - Medford Amber to Assist;  Surgeon: Kathlynn Sharper, MD;  Location: [facility] ORS;  Service: Orthopedics;  Laterality: Right;   WEDGE RESECTION, LUNG, ROBOT-ASSISTED, THORACOSCOPIC Right 05/09/2024   Procedure: WEDGE RESECTION, LUNG RIGHT UPPER LOBE, ROBOT-ASSISTED, THORACOSCOPIC;  Surgeon: Kerrin Elspeth BROCKS, MD;  [facility] OR;  Service: Thoracic;  Laterality: Right;    Family History  Problem Relation Age of Onset   Osteoarthritis Mother    Heart disease Mother    Hypertension Mother     Depression Mother    Heart disease Father    Early death Father    Hypertension Father    Heart attack Father    Hypertension Sister    Parkinson's disease Maternal Grandfather    Prostate cancer Neg Hx    Bladder Cancer Neg Hx    Kidney cancer Neg Hx    Tremor Neg Hx     Social History:  reports that he quit smoking about 25 years ago. His smoking use included cigarettes. He started smoking about 45 years ago. He has a 63.9 pack-year smoking history. He has been exposed to tobacco smoke. He has never used smokeless tobacco. He reports that he does not currently use alcohol  after a past usage of about 3.0 standard drinks of alcohol  per week. He reports that he does not currently use drugs after having used the following drugs: Cocaine and Marijuana.  Allergies:  Allergies  Allergen Reactions   Asenapine Other (See Comments) and Nausea And Vomiting    Increased tremors   Dextromethorphan Hbr    Guaifenesin     Latuda [Lurasidone Hcl] Other (See Comments)    Tremors  Lurasidone Other (See Comments) and Hives    Other Reaction(s): Angioedema   Phenylephrine      Medications: Reviewed  Results for orders placed or performed during the hospital encounter of 05/09/24 (from the past 48 hours)  I-STAT 7, (LYTES, BLD GAS, ICA, H+H)     Status: Abnormal   Collection Time: 05/22/24  9:40 AM  Result Value Ref Range   pH, Arterial 7.335 (L) 7.35 - 7.45   pCO2 arterial 61.0 (H) 32 - 48 mmHg   pO2, Arterial 90 83 - 108 mmHg   Bicarbonate 32.1 (H) 20.0 - 28.0 mmol/L   TCO2 34 (H) 22 - 32 mmol/L   O2 Saturation 95 %   Acid-Base Excess 5.0 (H) 0.0 - 2.0 mmol/L   Sodium 143 135 - 145 mmol/L   Potassium 5.0 3.5 - 5.1 mmol/L   Calcium , Ion 1.13 (L) 1.15 - 1.40 mmol/L   HCT 31.0 (L) 39.0 - 52.0 %   Hemoglobin 10.5 (L) 13.0 - 17.0 g/dL   Patient temperature 61.8 C    Sample type ARTERIAL   Glucose, capillary     Status: Abnormal   Collection Time: 05/22/24 12:42 PM  Result Value Ref  Range   Glucose-Capillary 113 (H) 70 - 99 mg/dL    Comment: Glucose reference range applies only to samples taken after fasting for at least 8 hours.  I-STAT 7, (LYTES, BLD GAS, ICA, H+H)     Status: Abnormal   Collection Time: 05/22/24  1:14 PM  Result Value Ref Range   pH, Arterial 7.187 (LL) 7.35 - 7.45   pCO2 arterial 90.4 (HH) 32 - 48 mmHg   pO2, Arterial 112 (H) 83 - 108 mmHg   Bicarbonate 33.7 (H) 20.0 - 28.0 mmol/L   TCO2 36 (H) 22 - 32 mmol/L   O2 Saturation 96 %   Acid-Base Excess 4.0 (H) 0.0 - 2.0 mmol/L   Sodium 143 135 - 145 mmol/L   Potassium 5.1 3.5 - 5.1 mmol/L   Calcium , Ion 1.18 1.15 - 1.40 mmol/L   HCT 31.0 (L) 39.0 - 52.0 %   Hemoglobin 10.5 (L) 13.0 - 17.0 g/dL   Patient temperature 61.6 C    Collection site art line    Drawn by Operator    Sample type ARTERIAL    Comment NOTIFIED PHYSICIAN   I-STAT 7, (LYTES, BLD GAS, ICA, H+H)     Status: Abnormal   Collection Time: 05/22/24  2:38 PM  Result Value Ref Range   pH, Arterial 7.141 (LL) 7.35 - 7.45   pCO2 arterial 99.5 (HH) 32 - 48 mmHg   pO2, Arterial 135 (H) 83 - 108 mmHg   Bicarbonate 33.4 (H) 20.0 - 28.0 mmol/L   TCO2 36 (H) 22 - 32 mmol/L   O2 Saturation 97 %   Acid-Base Excess 2.0 0.0 - 2.0 mmol/L   Sodium 144 135 - 145 mmol/L   Potassium 5.5 (H) 3.5 - 5.1 mmol/L   Calcium , Ion 1.16 1.15 - 1.40 mmol/L   HCT 32.0 (L) 39.0 - 52.0 %   Hemoglobin 10.9 (L) 13.0 - 17.0 g/dL   Patient temperature 61.9 C    Sample type ARTERIAL    Comment NOTIFIED PHYSICIAN   POCT Activated clotting time     Status: None   Collection Time: 05/22/24  3:53 PM  Result Value Ref Range   Activated Clotting Time 135 seconds    Comment: Reference range 74-137 seconds for patients not on anticoagulant therapy.  I-STAT 7, (  LYTES, BLD GAS, ICA, H+H)     Status: Abnormal   Collection Time: 05/22/24  4:09 PM  Result Value Ref Range   pH, Arterial 7.150 (LL) 7.35 - 7.45   pCO2 arterial 100.8 (HH) 32 - 48 mmHg   pO2, Arterial 125  (H) 83 - 108 mmHg   Bicarbonate 34.6 (H) 20.0 - 28.0 mmol/L   TCO2 38 (H) 22 - 32 mmol/L   O2 Saturation 97 %   Acid-Base Excess 4.0 (H) 0.0 - 2.0 mmol/L   Sodium 143 135 - 145 mmol/L   Potassium 5.8 (H) 3.5 - 5.1 mmol/L   Calcium , Ion 1.17 1.15 - 1.40 mmol/L   HCT 33.0 (L) 39.0 - 52.0 %   Hemoglobin 11.2 (L) 13.0 - 17.0 g/dL   Patient temperature 61.9 C    Sample type ARTERIAL    Comment NOTIFIED PHYSICIAN   CG4 I-STAT (Lactic acid)     Status: None   Collection Time: 05/22/24  4:09 PM  Result Value Ref Range   Lactic Acid, Venous 1.0 0.5 - 1.9 mmol/L  I-STAT 7, (LYTES, BLD GAS, ICA, H+H)     Status: Abnormal   Collection Time: 05/22/24  4:27 PM  Result Value Ref Range   pH, Arterial 7.181 (LL) 7.35 - 7.45   pCO2 arterial 85.2 (HH) 32 - 48 mmHg   pO2, Arterial 257 (H) 83 - 108 mmHg   Bicarbonate 31.4 (H) 20.0 - 28.0 mmol/L   TCO2 34 (H) 22 - 32 mmol/L   O2 Saturation 100 %   Acid-Base Excess 2.0 0.0 - 2.0 mmol/L   Sodium 144 135 - 145 mmol/L   Potassium 5.9 (H) 3.5 - 5.1 mmol/L   Calcium , Ion 1.08 (L) 1.15 - 1.40 mmol/L   HCT 27.0 (L) 39.0 - 52.0 %   Hemoglobin 9.2 (L) 13.0 - 17.0 g/dL   Patient temperature 61.9 C    Sample type ARTERIAL    Comment NOTIFIED PHYSICIAN   POCT Activated clotting time     Status: None   Collection Time: 05/22/24  4:32 PM  Result Value Ref Range   Activated Clotting Time 152 seconds    Comment: Reference range 74-137 seconds for patients not on anticoagulant therapy.  I-STAT 7, (LYTES, BLD GAS, ICA, H+H)     Status: Abnormal   Collection Time: 05/22/24  4:44 PM  Result Value Ref Range   pH, Arterial 7.396 7.35 - 7.45   pCO2 arterial 50.9 (H) 32 - 48 mmHg   pO2, Arterial 308 (H) 83 - 108 mmHg   Bicarbonate 30.9 (H) 20.0 - 28.0 mmol/L   TCO2 32 22 - 32 mmol/L   O2 Saturation 100 %   Acid-Base Excess 6.0 (H) 0.0 - 2.0 mmol/L   Sodium 142 135 - 145 mmol/L   Potassium 5.8 (H) 3.5 - 5.1 mmol/L   Calcium , Ion 1.08 (L) 1.15 - 1.40 mmol/L    HCT 27.0 (L) 39.0 - 52.0 %   Hemoglobin 9.2 (L) 13.0 - 17.0 g/dL   Patient temperature 61.9 C    Sample type ARTERIAL   POCT Activated clotting time     Status: None   Collection Time: 05/22/24  4:46 PM  Result Value Ref Range   Activated Clotting Time 210 seconds    Comment: Reference range 74-137 seconds for patients not on anticoagulant therapy.  POCT Activated clotting time     Status: None   Collection Time: 05/22/24  5:04 PM  Result Value Ref Range  Activated Clotting Time 210 seconds    Comment: Reference range 74-137 seconds for patients not on anticoagulant therapy.  I-STAT 7, (LYTES, BLD GAS, ICA, H+H)     Status: Abnormal   Collection Time: 05/22/24  5:05 PM  Result Value Ref Range   pH, Arterial 7.498 (H) 7.35 - 7.45   pCO2 arterial 38.0 32 - 48 mmHg   pO2, Arterial 236 (H) 83 - 108 mmHg   Bicarbonate 29.5 (H) 20.0 - 28.0 mmol/L   TCO2 31 22 - 32 mmol/L   O2 Saturation 100 %   Acid-Base Excess 6.0 (H) 0.0 - 2.0 mmol/L   Sodium 142 135 - 145 mmol/L   Potassium 5.8 (H) 3.5 - 5.1 mmol/L   Calcium , Ion 1.07 (L) 1.15 - 1.40 mmol/L   HCT 26.0 (L) 39.0 - 52.0 %   Hemoglobin 8.8 (L) 13.0 - 17.0 g/dL   Patient temperature 62.9 C    Sample type ARTERIAL   CBC     Status: Abnormal   Collection Time: 05/22/24  5:10 PM  Result Value Ref Range   WBC 22.9 (H) 4.0 - 10.5 K/uL   RBC 2.92 (L) 4.22 - 5.81 MIL/uL   Hemoglobin 9.4 (L) 13.0 - 17.0 g/dL   HCT 69.1 (L) 60.9 - 47.9 %   MCV 105.5 (H) 80.0 - 100.0 fL   MCH 32.2 26.0 - 34.0 pg   MCHC 30.5 30.0 - 36.0 g/dL   RDW 85.0 88.4 - 84.4 %   Platelets 201 150 - 400 K/uL   nRBC 0.3 (H) 0.0 - 0.2 %    Comment: Performed at [facility] Lab, [address] [place], [place] [address]  Basic metabolic panel     Status: Abnormal   Collection Time: 05/22/24  5:10 PM  Result Value Ref Range   Sodium 143 135 - 145 mmol/L   Potassium 5.3 (H) 3.5 - 5.1 mmol/L   Chloride 104 98 - 111 mmol/L   CO2 29 22 - 32 mmol/L   Glucose, Bld  200 (H) 70 - 99 mg/dL    Comment: Glucose reference range applies only to samples taken after fasting for at least 8 hours.   BUN 51 (H) 8 - 23 mg/dL   Creatinine, Ser 8.88 0.61 - 1.24 mg/dL   Calcium  7.5 (L) 8.9 - 10.3 mg/dL   GFR, Estimated >39 >39 mL/min    Comment: (NOTE) Calculated using the CKD-EPI Creatinine Equation (2021)    Anion gap 10 5 - 15    Comment: Performed at [facility] Lab, [address] [place], [place] [address]  Lactate dehydrogenase     Status: Abnormal   Collection Time: 05/22/24  5:10 PM  Result Value Ref Range   LDH 544 (H) 98 - 192 U/L    Comment: Performed at [facility] Lab, [address], [place], [place] [address]  APTT     Status: Abnormal   Collection Time: 05/22/24  5:10 PM  Result Value Ref Range   aPTT >200 (HH) 24 - 36 seconds    Comment:        IF BASELINE aPTT IS ELEVATED, SUGGEST PATIENT RISK ASSESSMENT BE USED TO DETERMINE APPROPRIATE ANTICOAGULANT THERAPY. REPEATED TO VERIFY CRITICAL RESULT CALLED TO, READ BACK BY AND VERIFIED WITH: A. BORDERS, RN AT [phone] 10.29.25 DSABRA CATHOLIC Performed at [facility] Lab, [address], [place], [place] [address]   Protime-INR     Status: Abnormal   Collection Time: 05/22/24  5:10 PM  Result Value Ref Range   Prothrombin Time 16.8 (H) 11.4 - 15.2 seconds   INR 1.3 (H) 0.8 - 1.2    Comment: (NOTE) INR goal varies based on device and disease states. Performed at [facility] Lab, [address] [place], [place] [address]   Heparin  level (unfractionated)     Status: Abnormal   Collection Time: 05/22/24  5:10 PM  Result Value Ref Range   Heparin  Unfractionated >1.10 (H) 0.30 - 0.70 IU/mL    Comment: (NOTE) The clinical reportable range upper limit is being lowered to >1.10 to align with the FDA approved guidance for the current laboratory assay.  If heparin  results are below expected values, and patient dosage has  been confirmed, suggest follow up testing of antithrombin III  levels. Performed at [facility] Lab, [address] [place], [place] [address]   Fibrinogen     Status: Abnormal   Collection Time: 05/22/24  5:10 PM  Result Value Ref Range   Fibrinogen 490 (H) 210 - 475 mg/dL    Comment: (NOTE) Fibrinogen results may be underestimated in patients receiving thrombolytic therapy. Performed at [facility] Lab, [address] [place], [place] [address]   Hepatic function panel     Status: Abnormal   Collection Time: 05/22/24  5:10 PM  Result Value Ref Range   Total Protein 5.1 (L) 6.5 - 8.1 g/dL   Albumin  1.6 (L) 3.5 - 5.0 g/dL   AST 54 (H) 15 - 41 U/L   ALT 28 0 - 44 U/L   Alkaline Phosphatase 79 38 - 126 U/L   Total Bilirubin 0.6 0.0 - 1.2 mg/dL   Bilirubin, Direct 0.3 (H) 0.0 - 0.2 mg/dL   Indirect Bilirubin 0.3 0.3 - 0.9 mg/dL    Comment: Performed at [facility] Lab, [address] [place], [place] [address]  Type and screen [facility]     Status: None (Preliminary result)   Collection Time: 05/22/24  5:11 PM  Result Value Ref Range   ABO/RH(D) O POS    Antibody Screen NEG    Sample Expiration 05/25/2024,2359    Unit Number [id number]    Blood Component Type RED CELLS,LR    Unit division 00    Status of Unit ALLOCATED    Transfusion Status OK TO TRANSFUSE    Crossmatch Result      Compatible Performed at [facility] Lab, [address], [place], [place] [address]    Unit Number [id number]    Blood Component Type RED CELLS,LR    Unit division 00    Status of Unit ALLOCATED    Transfusion Status OK TO TRANSFUSE    Crossmatch Result Compatible    Unit Number [id number]    Blood Component Type RED CELLS,LR    Unit division 00    Status of Unit ALLOCATED    Transfusion Status OK TO TRANSFUSE    Crossmatch Result Compatible    Unit Number [id number]    Blood Component Type RED CELLS,LR    Unit division 00    Status of Unit ALLOCATED    Transfusion Status OK TO TRANSFUSE    Crossmatch  Result Compatible    Unit Number [id number]    Blood Component Type RED CELLS,LR    Unit division 00    Status of Unit ALLOCATED    Transfusion Status OK TO TRANSFUSE    Crossmatch Result Compatible    Unit Number [id number]  Blood Component Type RBC LR PHER1    Unit division 00    Status of Unit ALLOCATED    Transfusion Status OK TO TRANSFUSE    Crossmatch Result Compatible   Global TEG Panel     Status: None   Collection Time: 05/22/24  5:11 PM  Result Value Ref Range   Citrated Kaolin (R) NOT CALCULATED 4.6 - 9.1 min    Comment: RESULT CALLED TO, READ BACK BY AND VERIFIED WITH: FABIENE NETT, RN @ 19:25 05/22/24 S. MEEKS    Citrated Kaolin (K) NOT CALCULATED 0.8 - 2.1 min    Comment: RESULT CALLED TO, READ BACK BY AND VERIFIED WITH: FABIENE NETT, RN @ 19:25 05/22/24 S. MEEKS    Citrated Kaolin Angle NOT CALCULATED 63 - 78 deg    Comment: RESULT CALLED TO, READ BACK BY AND VERIFIED WITH: FABIENE NETT, RN @ 19:25 05/22/24 S. MEEKS    Citrated Kaolin (MA) NOT CALCULATED 52 - 69 mm    Comment: RESULT CALLED TO, READ BACK BY AND VERIFIED WITH: FABIENE NETT, RN @ 19:25 05/22/24 S. MEEKS    Citrated Rapid TEG (MA) 67.5 52 - 70 mm   CK with Heparinase (R) 7.2 4.3 - 8.3 min   CFF Max Amplitude 16.1 15 - 32 mm   Citrated Functional Fibrinogen 293.8 278 - 581 mg/dL    Comment: Performed at [facility] Lab, [address], [place], [place] [address]  Prepare RBC (crossmatch)     Status: None   Collection Time: 05/22/24  5:30 PM  Result Value Ref Range   Order Confirmation      ORDER PROCESSED BY BLOOD BANK Performed at [facility] Lab, [address], [place], [place] [address]   CG4 I-STAT (Lactic acid)     Status: None   Collection Time: 05/22/24  5:30 PM  Result Value Ref Range   Lactic Acid, Venous 1.6 0.5 - 1.9 mmol/L  Glucose, capillary     Status: Abnormal   Collection Time: 05/22/24  5:39 PM  Result Value Ref Range   Glucose-Capillary 182 (H) 70 - 99 mg/dL     Comment: Glucose reference range applies only to samples taken after fasting for at least 8 hours.  Body fluid cell count with differential     Status: Abnormal   Collection Time: 05/22/24  6:45 PM  Result Value Ref Range   Fluid Type-FCT Bronch Lavag    Color, Fluid WHITE    Appearance, Fluid TURBID (A) CLEAR   Total Nucleated Cell Count, Fluid 2,450 (H) 0 - 1,000 cu mm   Neutrophil Count, Fluid 100 (H) 0 - 25 %   Lymphs, Fluid 0 %   Monocyte-Macrophage-Serous Fluid 0 (L) 50 - 90 %   Eos, Fluid 0 %   Other Cells, Fluid BUDDING YEAST SEEN %    Comment: Performed at [facility] Lab, [address], [place], [place] [address]  Culture, Respiratory w Gram Stain     Status: None (Preliminary result)   Collection Time: 05/22/24  6:45 PM   Specimen: Bronchoalveolar Lavage; Respiratory  Result Value Ref Range   Specimen Description BRONCHIAL ALVEOLAR LAVAGE    Special Requests NONE    Gram Stain      FEW WBC PRESENT, PREDOMINANTLY PMN FEW BUDDING YEAST SEEN    Culture      MODERATE YEAST CULTURE REINCUBATED FOR BETTER GROWTH Performed at [facility] Lab, [address], [place], [place] [address]  Report Status PENDING   Pneumocystis smear by DFA     Status: None   Collection Time: 05/22/24  6:45 PM   Specimen: Bronchoalveolar Lavage; Respiratory  Result Value Ref Range   Specimen Source-[facility] BRONCHIAL ALVEOLAR LAVAGE    Pneumocystis jiroveci Ag See Scanned report in [place] [employer]     Comment: Performed at [facility] Lab, [address], [place], [place] [address]  Heparin  level (unfractionated)     Status: Abnormal   Collection Time: 05/22/24  6:45 PM  Result Value Ref Range   Heparin  Unfractionated >1.10 (H) 0.30 - 0.70 IU/mL    Comment: (NOTE) The clinical reportable range upper limit is being lowered to >1.10 to align with the FDA approved guidance for the current laboratory assay.  If heparin  results are below expected values, and patient dosage has   been confirmed, suggest follow up testing of antithrombin III levels. Performed at [facility] Lab, [address], [place], [place] [address]   I-STAT 7, (LYTES, BLD GAS, ICA, H+H)     Status: Abnormal   Collection Time: 05/22/24  7:04 PM  Result Value Ref Range   pH, Arterial 7.440 7.35 - 7.45   pCO2 arterial 44.0 32 - 48 mmHg   pO2, Arterial 80 (L) 83 - 108 mmHg   Bicarbonate 29.9 (H) 20.0 - 28.0 mmol/L   TCO2 31 22 - 32 mmol/L   O2 Saturation 96 %   Acid-Base Excess 5.0 (H) 0.0 - 2.0 mmol/L   Sodium 142 135 - 145 mmol/L   Potassium 5.7 (H) 3.5 - 5.1 mmol/L   Calcium , Ion 1.10 (L) 1.15 - 1.40 mmol/L   HCT 26.0 (L) 39.0 - 52.0 %   Hemoglobin 8.8 (L) 13.0 - 17.0 g/dL   Sample type ARTERIAL   Glucose, capillary     Status: Abnormal   Collection Time: 05/22/24  7:45 PM  Result Value Ref Range   Glucose-Capillary 184 (H) 70 - 99 mg/dL    Comment: Glucose reference range applies only to samples taken after fasting for at least 8 hours.  Global TEG Panel     Status: None   Collection Time: 05/22/24  7:46 PM  Result Value Ref Range   Citrated Kaolin (R) NOT CALCULATED 4.6 - 9.1 min   Citrated Kaolin (K) NOT CALCULATED 0.8 - 2.1 min   Citrated Kaolin Angle NOT CALCULATED 63 - 78 deg   Citrated Kaolin (MA) NOT CALCULATED 52 - 69 mm   Citrated Rapid TEG (MA) 68.7 52 - 70 mm   CK with Heparinase (R) 7.4 4.3 - 8.3 min   CFF Max Amplitude 31.7 15 - 32 mm   Citrated Functional Fibrinogen 578.5 278 - 581 mg/dL    Comment: Performed at [facility] Lab, [address], [place], [place] [address]  CBC     Status: Abnormal   Collection Time: 05/22/24 10:43 PM  Result Value Ref Range   WBC 23.7 (H) 4.0 - 10.5 K/uL   RBC 2.88 (L) 4.22 - 5.81 MIL/uL   Hemoglobin 9.4 (L) 13.0 - 17.0 g/dL   HCT 70.4 (L) 60.9 - 47.9 %   MCV 102.4 (H) 80.0 - 100.0 fL   MCH 32.6 26.0 - 34.0 pg   MCHC 31.9 30.0 - 36.0 g/dL   RDW 85.0 88.4 - 84.4 %   Platelets 243 150 - 400 K/uL   nRBC 0.3 (H) 0.0 - 0.2  %    Comment: Performed at [facility]  [facility], [address] [place], [place] [address]  APTT     Status: Abnormal   Collection Time: 05/22/24 10:43 PM  Result Value Ref Range   aPTT 65 (H) 24 - 36 seconds    Comment:        IF BASELINE aPTT IS ELEVATED, SUGGEST PATIENT RISK ASSESSMENT BE USED TO DETERMINE APPROPRIATE ANTICOAGULANT THERAPY. Performed at [place] [facility], [address] [place], [place] [address]   Glucose, capillary     Status: Abnormal   Collection Time: 05/22/24 11:24 PM  Result Value Ref Range   Glucose-Capillary 180 (H) 70 - 99 mg/dL    Comment: Glucose reference range applies only to samples taken after fasting for at least 8 hours.  APTT     Status: Abnormal   Collection Time: 05/23/24  3:00 AM  Result Value Ref Range   aPTT 58 (H) 24 - 36 seconds    Comment:        IF BASELINE aPTT IS ELEVATED, SUGGEST PATIENT RISK ASSESSMENT BE USED TO DETERMINE APPROPRIATE ANTICOAGULANT THERAPY. Performed at [facility], [address], [place], [place] [address]   Heparin  level (unfractionated)     Status: None   Collection Time: 05/23/24  3:00 AM  Result Value Ref Range   Heparin  Unfractionated 0.33 0.30 - 0.70 IU/mL    Comment: (NOTE) The clinical reportable range upper limit is being lowered to >1.10 to align with the FDA approved guidance for the current laboratory assay.  If heparin  results are below expected values, and patient dosage has  been confirmed, suggest follow up testing of antithrombin III levels. Performed at [facility], [address], [place], [place] [address]   Glucose, capillary     Status: Abnormal   Collection Time: 05/23/24  3:35 AM  Result Value Ref Range   Glucose-Capillary 122 (H) 70 - 99 mg/dL    Comment: Glucose reference range applies only to samples taken after fasting for at least 8 hours.  CBC with Differential/Platelet     Status: Abnormal   Collection Time: 05/23/24  4:45 AM  Result Value Ref Range    WBC 26.7 (H) 4.0 - 10.5 K/uL   RBC 2.74 (L) 4.22 - 5.81 MIL/uL   Hemoglobin 8.9 (L) 13.0 - 17.0 g/dL   HCT 71.9 (L) 60.9 - 47.9 %   MCV 102.2 (H) 80.0 - 100.0 fL   MCH 32.5 26.0 - 34.0 pg   MCHC 31.8 30.0 - 36.0 g/dL   RDW 85.1 88.4 - 84.4 %   Platelets 263 150 - 400 K/uL   nRBC 0.3 (H) 0.0 - 0.2 %   Neutrophils Relative % 69 %   Neutro Abs 18.6 (H) 1.7 - 7.7 K/uL   Lymphocytes Relative 10 %   Lymphs Abs 2.6 0.7 - 4.0 K/uL   Monocytes Relative 9 %   Monocytes Absolute 2.3 (H) 0.1 - 1.0 K/uL   Eosinophils Relative 1 %   Eosinophils Absolute 0.1 0.0 - 0.5 K/uL   Basophils Relative 0 %   Basophils Absolute 0.0 0.0 - 0.1 K/uL   WBC Morphology See Note     Comment: Mild Left Shift (1-5% metas, occ myelo)   Smear Review Normal platelet morphology    Immature Granulocytes 11 %   Abs Immature Granulocytes 3.03 (H) 0.00 - 0.07 K/uL   Polychromasia PRESENT     Comment: Performed at [facility], [address], [place], [place] [address]  Basic metabolic panel with GFR     Status: Abnormal   Collection Time: 05/23/24  4:45 AM  Result Value Ref Range   Sodium 146 (H) 135 - 145 mmol/L   Potassium 5.3 (H) 3.5 - 5.1 mmol/L   Chloride 106 98 - 111 mmol/L   CO2 29 22 - 32 mmol/L   Glucose, Bld 113 (H) 70 - 99 mg/dL    Comment: Glucose reference range applies only to samples taken after fasting for at least 8 hours.   BUN 46 (H) 8 - 23 mg/dL   Creatinine, Ser 9.11 0.61 - 1.24 mg/dL   Calcium  7.5 (L) 8.9 - 10.3 mg/dL   GFR, Estimated >39 >39 mL/min    Comment: (NOTE) Calculated using the CKD-EPI Creatinine Equation (2021)    Anion gap 11 5 - 15    Comment: Performed at [facility], [address] [place], [place] [address]  Magnesium      Status: Abnormal   Collection Time: 05/23/24  4:45 AM  Result Value Ref Range   Magnesium  3.1 (H) 1.7 - 2.4 mg/dL    Comment: Performed at [facility], [address], [place], [place] [address]  Phosphorus     Status: None    Collection Time: 05/23/24  4:45 AM  Result Value Ref Range   Phosphorus 3.1 2.5 - 4.6 mg/dL    Comment: Performed at [facility], [address], [place], [place] [address]  Triglycerides     Status: None   Collection Time: 05/23/24  4:45 AM  Result Value Ref Range   Triglycerides 97 <150 mg/dL    Comment: Performed at [facility], [address] [place], [place] [address]  Lactate dehydrogenase     Status: Abnormal   Collection Time: 05/23/24  4:45 AM  Result Value Ref Range   LDH 478 (H) 98 - 192 U/L    Comment: Performed at [facility], [address], [place], [place] [address]  Protime-INR     Status: Abnormal   Collection Time: 05/23/24  4:45 AM  Result Value Ref Range   Prothrombin Time 15.3 (H) 11.4 - 15.2 seconds   INR 1.1 0.8 - 1.2    Comment: (NOTE) INR goal varies based on device and disease states. Performed at [facility], [address], [place], [place] [address]   Fibrinogen     Status: Abnormal   Collection Time: 05/23/24  4:45 AM  Result Value Ref Range   Fibrinogen 551 (H) 210 - 475 mg/dL    Comment: (NOTE) Fibrinogen results may be underestimated in patients receiving thrombolytic therapy. Performed at [facility], [address], [place], [place] [address]   Hepatic function panel     Status: Abnormal   Collection Time: 05/23/24  4:45 AM  Result Value Ref Range   Total Protein 5.2 (L) 6.5 - 8.1 g/dL   Albumin  1.8 (L) 3.5 - 5.0 g/dL   AST 31 15 - 41 U/L   ALT 23 0 - 44 U/L   Alkaline Phosphatase 79 38 - 126 U/L   Total Bilirubin 0.5 0.0 - 1.2 mg/dL   Bilirubin, Direct 0.1 0.0 - 0.2 mg/dL   Indirect Bilirubin 0.4 0.3 - 0.9 mg/dL    Comment: Performed at [facility], [address], [place], [place] [address]  I-STAT 7, (LYTES, BLD GAS, ICA, H+H)     Status: Abnormal   Collection Time: 05/23/24  4:53 AM  Result Value Ref Range   pH, Arterial 7.402 7.35 - 7.45   pCO2 arterial 51.9 (H) 32 - 48 mmHg   pO2,  Arterial 68 (L) 83 - 108 mmHg   Bicarbonate 32.3 (H) 20.0 - 28.0 mmol/L   TCO2 34 (H) 22 - 32 mmol/L   O2 Saturation 93 %   Acid-Base Excess 7.0 (H) 0.0 - 2.0 mmol/L   Sodium 145 135 - 145 mmol/L   Potassium 5.2 (H) 3.5 - 5.1 mmol/L   Calcium , Ion 1.13 (L) 1.15 - 1.40 mmol/L   HCT 25.0 (L) 39.0 - 52.0 %   Hemoglobin 8.5 (L) 13.0 - 17.0 g/dL   Patient temperature 62.8 C    Sample type ARTERIAL   CG4 I-STAT (Lactic acid)     Status: None   Collection Time: 05/23/24  4:58 AM  Result Value Ref Range   Lactic Acid, Venous 1.1 0.5 - 1.9 mmol/L  Glucose, capillary     Status: Abnormal   Collection Time: 05/23/24  7:43 AM  Result Value Ref Range   Glucose-Capillary 117 (H) 70 - 99 mg/dL    Comment: Glucose reference range applies only to samples taken after fasting for at least 8 hours.  Glucose, capillary     Status: None   Collection Time: 05/23/24 11:23 AM  Result Value Ref Range   Glucose-Capillary 75 70 - 99 mg/dL    Comment: Glucose reference range applies only to samples taken after fasting for at least 8 hours.  APTT     Status: None   Collection Time: 05/23/24 12:30 PM  Result Value Ref Range   aPTT 35 24 - 36 seconds    Comment: Performed at [facility], [address] [place], [place] [address]  I-STAT 7, (LYTES, BLD GAS, ICA, H+H)     Status: Abnormal   Collection Time: 05/23/24 12:33 PM  Result Value Ref Range   pH, Arterial 7.432 7.35 - 7.45   pCO2 arterial 47.0 32 - 48 mmHg   pO2, Arterial 64 (L) 83 - 108 mmHg   Bicarbonate 31.3 (H) 20.0 - 28.0 mmol/L   TCO2 33 (H) 22 - 32 mmol/L   O2 Saturation 92 %   Acid-Base Excess 6.0 (H) 0.0 - 2.0 mmol/L   Sodium 144 135 - 145 mmol/L   Potassium 4.6 3.5 - 5.1 mmol/L   Calcium , Ion 1.16 1.15 - 1.40 mmol/L   HCT 26.0 (L) 39.0 - 52.0 %   Hemoglobin 8.8 (L) 13.0 - 17.0 g/dL   Patient temperature 62.8 C    Sample type ARTERIAL   Glucose, capillary     Status: None   Collection Time: 05/23/24  3:31 PM  Result Value Ref  Range   Glucose-Capillary 97 70 - 99 mg/dL    Comment: Glucose reference range applies only to samples taken after fasting for at least 8 hours.  CBC     Status: Abnormal   Collection Time: 05/23/24  5:17 PM  Result Value Ref Range   WBC 18.9 (H) 4.0 - 10.5 K/uL   RBC 2.75 (L) 4.22 - 5.81 MIL/uL   Hemoglobin 8.9 (L) 13.0 - 17.0 g/dL   HCT 71.5 (L) 60.9 - 47.9 %   MCV 103.3 (H) 80.0 - 100.0 fL   MCH 32.4 26.0 - 34.0 pg   MCHC 31.3 30.0 - 36.0 g/dL   RDW 85.0 88.4 - 84.4 %   Platelets 223 150 - 400 K/uL   nRBC 0.2  0.0 - 0.2 %    Comment: Performed at [facility], [address] [place], [place] [address]  Basic metabolic panel     Status: Abnormal   Collection Time: 05/23/24  5:17 PM  Result Value Ref Range   Sodium 145 135 - 145 mmol/L   Potassium 5.0 3.5 - 5.1 mmol/L   Chloride 106 98 - 111 mmol/L   CO2 29 22 - 32 mmol/L   Glucose, Bld 160 (H) 70 - 99 mg/dL    Comment: Glucose reference range applies only to samples taken after fasting for at least 8 hours.   BUN 50 (H) 8 - 23 mg/dL   Creatinine, Ser 8.99 0.61 - 1.24 mg/dL   Calcium  7.8 (L) 8.9 - 10.3 mg/dL   GFR, Estimated >39 >39 mL/min    Comment: (NOTE) Calculated using the CKD-EPI Creatinine Equation (2021)    Anion gap 10 5 - 15    Comment: Performed at [facility], [address], [place], [place] [address]  I-STAT 7, (LYTES, BLD GAS, ICA, H+H)     Status: Abnormal   Collection Time: 05/23/24  5:24 PM  Result Value Ref Range   pH, Arterial 7.419 7.35 - 7.45   pCO2 arterial 48.6 (H) 32 - 48 mmHg   pO2, Arterial 57 (L) 83 - 108 mmHg   Bicarbonate 31.4 (H) 20.0 - 28.0 mmol/L   TCO2 33 (H) 22 - 32 mmol/L   O2 Saturation 89 %   Acid-Base Excess 6.0 (H) 0.0 - 2.0 mmol/L   Sodium 145 135 - 145 mmol/L   Potassium 5.0 3.5 - 5.1 mmol/L   Calcium , Ion 1.15 1.15 - 1.40 mmol/L   HCT 25.0 (L) 39.0 - 52.0 %   Hemoglobin 8.5 (L) 13.0 - 17.0 g/dL   Patient temperature 62.8 C    Sample type ARTERIAL   CG4  I-STAT (Lactic acid)     Status: None   Collection Time: 05/23/24  5:34 PM  Result Value Ref Range   Lactic Acid, Venous 1.3 0.5 - 1.9 mmol/L  APTT     Status: None   Collection Time: 05/23/24  6:52 PM  Result Value Ref Range   aPTT 36 24 - 36 seconds    Comment: Performed at [facility], [address] [place], [place] [address]  Glucose, capillary     Status: Abnormal   Collection Time: 05/23/24  8:14 PM  Result Value Ref Range   Glucose-Capillary 145 (H) 70 - 99 mg/dL    Comment: Glucose reference range applies only to samples taken after fasting for at least 8 hours.  I-STAT 7, (LYTES, BLD GAS, ICA, H+H)     Status: Abnormal   Collection Time: 05/23/24  8:15 PM  Result Value Ref Range   pH, Arterial 7.421 7.35 - 7.45   pCO2 arterial 45.0 32 - 48 mmHg   pO2, Arterial 59 (L) 83 - 108 mmHg   Bicarbonate 29.2 (H) 20.0 - 28.0 mmol/L   TCO2 31 22 - 32 mmol/L   O2 Saturation 90 %   Acid-Base Excess 4.0 (H) 0.0 - 2.0 mmol/L   Sodium 145 135 - 145 mmol/L   Potassium 4.8 3.5 - 5.1 mmol/L   Calcium , Ion 1.12 (L) 1.15 - 1.40 mmol/L   HCT 24.0 (L) 39.0 - 52.0 %   Hemoglobin 8.2 (L) 13.0 - 17.0 g/dL   Patient temperature 62.8 C    Sample type ARTERIAL   Glucose, capillary  Status: None   Collection Time: 05/23/24 11:47 PM  Result Value Ref Range   Glucose-Capillary 92 70 - 99 mg/dL    Comment: Glucose reference range applies only to samples taken after fasting for at least 8 hours.  APTT     Status: Abnormal   Collection Time: 05/23/24 11:55 PM  Result Value Ref Range   aPTT 39 (H) 24 - 36 seconds    Comment:        IF BASELINE aPTT IS ELEVATED, SUGGEST PATIENT RISK ASSESSMENT BE USED TO DETERMINE APPROPRIATE ANTICOAGULANT THERAPY. Performed at [facility], [address], [place], [place] [address]   CBC with Differential/Platelet     Status: Abnormal   Collection Time: 05/24/24  4:01 AM  Result Value Ref Range   WBC 21.2 (H) 4.0 - 10.5 K/uL   RBC 2.80 (L)  4.22 - 5.81 MIL/uL   Hemoglobin 9.1 (L) 13.0 - 17.0 g/dL   HCT 70.7 (L) 60.9 - 47.9 %   MCV 104.3 (H) 80.0 - 100.0 fL   MCH 32.5 26.0 - 34.0 pg   MCHC 31.2 30.0 - 36.0 g/dL   RDW 84.9 88.4 - 84.4 %   Platelets 248 150 - 400 K/uL   nRBC 0.4 (H) 0.0 - 0.2 %   Neutrophils Relative % 73 %   Neutro Abs 15.5 (H) 1.7 - 7.7 K/uL   Lymphocytes Relative 11 %   Lymphs Abs 2.3 0.7 - 4.0 K/uL   Monocytes Relative 8 %   Monocytes Absolute 1.6 (H) 0.1 - 1.0 K/uL   Eosinophils Relative 1 %   Eosinophils Absolute 0.2 0.0 - 0.5 K/uL   Basophils Relative 0 %   Basophils Absolute 0.1 0.0 - 0.1 K/uL   WBC Morphology See Note     Comment: Mild Left Shift (1-5% metas, occ myelo)   Smear Review Normal platelet morphology    Immature Granulocytes 7 %   Abs Immature Granulocytes 1.56 (H) 0.00 - 0.07 K/uL   Polychromasia PRESENT     Comment: Performed at [facility], [address], [place], [place] [address]  Basic metabolic panel with GFR     Status: Abnormal   Collection Time: 05/24/24  4:01 AM  Result Value Ref Range   Sodium 147 (H) 135 - 145 mmol/L   Potassium 5.1 3.5 - 5.1 mmol/L   Chloride 107 98 - 111 mmol/L   CO2 29 22 - 32 mmol/L   Glucose, Bld 109 (H) 70 - 99 mg/dL    Comment: Glucose reference range applies only to samples taken after fasting for at least 8 hours.   BUN 50 (H) 8 - 23 mg/dL   Creatinine, Ser 9.15 0.61 - 1.24 mg/dL   Calcium  8.2 (L) 8.9 - 10.3 mg/dL   GFR, Estimated >39 >39 mL/min    Comment: (NOTE) Calculated using the CKD-EPI Creatinine Equation (2021)    Anion gap 11 5 - 15    Comment: Performed at [facility], [address], [place], [place] [address]  Magnesium      Status: Abnormal   Collection Time: 05/24/24  4:01 AM  Result Value Ref Range   Magnesium  3.0 (H) 1.7 - 2.4 mg/dL    Comment: Performed at [facility], [address], [place], [place] [address]  Phosphorus     Status: None   Collection Time: 05/24/24  4:01 AM  Result Value Ref  Range   Phosphorus 3.7 2.5 - 4.6 mg/dL  Comment: Performed at [facility], [address], [place], [place] [address]  Lactate dehydrogenase     Status: Abnormal   Collection Time: 05/24/24  4:01 AM  Result Value Ref Range   LDH 453 (H) 98 - 192 U/L    Comment: Performed at [facility], [address], [place], [place] [address]  Protime-INR     Status: Abnormal   Collection Time: 05/24/24  4:01 AM  Result Value Ref Range   Prothrombin Time 17.0 (H) 11.4 - 15.2 seconds   INR 1.3 (H) 0.8 - 1.2    Comment: (NOTE) INR goal varies based on device and disease states. Performed at [facility], [address], [place], [place] [address]   Fibrinogen     Status: Abnormal   Collection Time: 05/24/24  4:01 AM  Result Value Ref Range   Fibrinogen 548 (H) 210 - 475 mg/dL    Comment: (NOTE) Fibrinogen results may be underestimated in patients receiving thrombolytic therapy. Performed at [facility], [address], [place], [place] [address]   Hepatic function panel     Status: Abnormal   Collection Time: 05/24/24  4:01 AM  Result Value Ref Range   Total Protein 5.3 (L) 6.5 - 8.1 g/dL   Albumin  1.9 (L) 3.5 - 5.0 g/dL   AST 56 (H) 15 - 41 U/L   ALT 50 (H) 0 - 44 U/L   Alkaline Phosphatase 92 38 - 126 U/L   Total Bilirubin 0.7 0.0 - 1.2 mg/dL   Bilirubin, Direct 0.2 0.0 - 0.2 mg/dL   Indirect Bilirubin 0.5 0.3 - 0.9 mg/dL    Comment: Performed at [facility], [address], [place], [place] [address]  APTT     Status: Abnormal   Collection Time: 05/24/24  4:01 AM  Result Value Ref Range   aPTT 45 (H) 24 - 36 seconds    Comment:        IF BASELINE aPTT IS ELEVATED, SUGGEST PATIENT RISK ASSESSMENT BE USED TO DETERMINE APPROPRIATE ANTICOAGULANT THERAPY. Performed at [facility], [address], [place], [place] [address]   Glucose, capillary     Status: Abnormal   Collection Time: 05/24/24  4:06 AM  Result Value Ref Range    Glucose-Capillary 117 (H) 70 - 99 mg/dL    Comment: Glucose reference range applies only to samples taken after fasting for at least 8 hours.  I-STAT 7, (LYTES, BLD GAS, ICA, H+H)     Status: Abnormal   Collection Time: 05/24/24  4:08 AM  Result Value Ref Range   pH, Arterial 7.438 7.35 - 7.45   pCO2 arterial 46.3 32 - 48 mmHg   pO2, Arterial 61 (L) 83 - 108 mmHg   Bicarbonate 31.3 (H) 20.0 - 28.0 mmol/L   TCO2 33 (H) 22 - 32 mmol/L   O2 Saturation 92 %   Acid-Base Excess 6.0 (H) 0.0 - 2.0 mmol/L   Sodium 147 (H) 135 - 145 mmol/L   Potassium 5.0 3.5 - 5.1 mmol/L   Calcium , Ion 1.17 1.15 - 1.40 mmol/L   HCT 25.0 (L) 39.0 - 52.0 %   Hemoglobin 8.5 (L) 13.0 - 17.0 g/dL   Patient temperature 62.8 C    Sample type ARTERIAL   CG4 I-STAT (Lactic acid)     Status: None   Collection Time: 05/24/24  4:09 AM  Result Value Ref Range   Lactic Acid, Venous 1.1 0.5 -  1.9 mmol/L  Glucose, capillary     Status: Abnormal   Collection Time: 05/24/24  8:04 AM  Result Value Ref Range   Glucose-Capillary 123 (H) 70 - 99 mg/dL    Comment: Glucose reference range applies only to samples taken after fasting for at least 8 hours.    DG CHEST PORT 1 VIEW Result Date: 05/23/2024 CLINICAL DATA:  Intubation. EXAM: PORTABLE CHEST 1 VIEW COMPARISON:  05/23/2024 at 5:17 a.m. FINDINGS: Enteric tube courses into the region of the stomach and off the image as tip is not visualized. Endotracheal tube has tip approximately 4.8 cm above the carina. Right-sided chest tube unchanged. Left IJ central venous catheter with tip over the SVC unchanged. Surgical suture over the right perihilar region and mid lung. Moderate right-sided pneumothorax possibly slightly worse. Stable hazy opacification throughout the left lung. Left basilar opacification likely effusion with atelectasis. Stable right base opacification. Stable borderline cardiomegaly. Remainder of the exam is unchanged. IMPRESSION: 1. Moderate right-sided pneumothorax  possibly slightly worse. Right-sided chest tube unchanged. 2. Stable hazy opacification throughout the left lung. Stable left base/retrocardiac opacification and stable right base opacification. 3. Tubes and lines as described. Electronically Signed   By: Toribio Agreste M.D.   On: 05/23/2024 12:02   DG Chest Port 1 View Result Date: 05/23/2024 EXAM: 1 VIEW(S) XRAY OF THE CHEST 05/23/2024 05:15:00 AM COMPARISON: Portable chest yesterday at 5:18 pm. CLINICAL HISTORY: ARDS (adult respiratory distress syndrome) (HCC) 33510. Reason for exam: ARDS. FINDINGS: LINES, TUBES AND DEVICES: Right apical chest tube positioning, ECMO catheter extending both above and below the plane of imaging, both unchanged. ETT tip is 5.6 cm from the carina. Feeding tube extends into the stomach with the intragastric course out of view. A left IJ central line has a tip near the superior cavoatrial junction. LUNGS AND PLEURA: Diffuse bilateral airspace disease continues to be seen, denser on the right due to the partial lung collapse. No new airspace disease is seen. There is a right pneumothorax of moderate size with 9 cm pleuroparenchymal separation previously 8 cm. There are small pleural effusions which appear similar. HEART AND MEDIASTINUM: Stable cardiomegaly. Stable mediastinum. No substantial mediastinal shift is seen. BONES AND SOFT TISSUES: No new osseous findings. IMPRESSION: 1. Moderate right pneumothorax with slight interval increase to 9 cm pleuroparenchymal separation, without substantial mediastinal shift. 2. Diffuse bilateral airspace disease, greater on the right due to partial lung collapse, without new airspace disease. Electronically signed by: Francis Quam MD 05/23/2024 07:09 AM EDT RP Workstation: [id number]   DG Chest Port 1 View Result Date: 05/22/2024 EXAM: 1 VIEW XRAY OF THE CHEST 05/22/2024 01:50:00 PM COMPARISON: Comparison is made to a study from 05/22/2024. CLINICAL HISTORY: ARDS (adult respiratory distress  syndrome); PATIENT LAYING PRONE. FINDINGS: LINES, TUBES AND DEVICES: Stable right-sided chest tube. Feeding tube in her stomach. Endotracheal tube unchanged. LUNGS AND PLEURA: Stable bilateral lung opacities. Grossly stable right apical pneumothorax. No pulmonary edema. No pleural effusion. HEART AND MEDIASTINUM: No acute abnormality of the cardiac and mediastinal silhouettes. BONES AND SOFT TISSUES: No acute osseous abnormality. IMPRESSION: 1. Stable bilateral lung opacities, consistent with ARDS. 2. Stable right apical pneumothorax. 3. Stable right-sided chest tube. 4. Enteric tube appropriately positioned in the stomach. 5. Endotracheal tube unchanged in position. Electronically signed by: Lynwood Seip MD 05/22/2024 05:35 PM EDT RP Workstation: [id number]   DG Chest Port 1 View Result Date: 05/22/2024 CLINICAL DATA:  ECMO. EXAM: PORTABLE CHEST 1 VIEW COMPARISON:  Earlier radiograph dated 05/22/2024. FINDINGS:  Right-sided chest tube in similar position. No significant interval change in the size of right pneumothorax. Interval removal of the enteric tube and placement of a feeding tube. Additional support apparatus in similar position. No significant interval change in bilateral pulmonary opacities. Stable cardiomegaly. No acute osseous pathology. IMPRESSION: No significant interval change in the size of right pneumothorax or bilateral pulmonary opacities. Electronically Signed   By: Vanetta Chou M.D.   On: 05/22/2024 17:30   CARDIAC CATHETERIZATION Result Date: 05/22/2024 Successful VV ECMO placement.   DG Abd Portable 1V Result Date: 05/22/2024 EXAM: 1 VIEW XRAY OF THE ABDOMEN 05/22/2024 10:07:17 AM COMPARISON: 05/20/2024 CLINICAL HISTORY: feeding tube placement ,? Post pyloric; rover FINDINGS: LINES, TUBES AND DEVICES: Feeding tube in place with tip terminating over the distal stomach. BOWEL: Nonobstructive bowel gas pattern. SOFT TISSUES: No opaque urinary calculi. BONES: Lumbosacral spine  fixation hardware and intervertebral disc spacers noted. IMPRESSION: 1. Feeding tube in place with the tip terminating over the distal stomach. Electronically signed by: Donnice Mania MD 05/22/2024 11:02 AM EDT RP Workstation: [id number]    MND:Wzhjupcz except as listed in admit H&P  Blood pressure (!) 133/48, pulse 63, temperature 98.8 F (37.1 C), resp. rate (!) 63, height 6' 3 (1.905 m), weight 107.8 kg, SpO2 94%.  PHYSICAL EXAM: Overall appearance: Sedated, intubated orally and on ECMO Head:  Normocephalic, atraumatic. Ears: External ears are healthy. Nose: External nose is healthy in appearance. Internal nasal exam free of any lesions or obstruction. Oral Cavity/Pharynx: Limited exam, endotracheal tube in place. Larynx/Hypopharynx: Deferred Neuro: Under deep sedation. Neck: No palpable neck masses.  Studies Reviewed: none  Procedures: none   Assessment/Plan: Agree with need for tracheostomy.  Have scheduled for Monday afternoon.  If on anticoagulation, please stop for surgery on Monday.  Medical Decision Making: #/Complex Problems: 2  Data Reviewed:1  Management:2 (1-Straightforward, 2-Low, 3-Moderate, 4-High)   Ida Loader 05/24/2024, 8:54 AM

## 2024-05-24 NOTE — Progress Notes (Signed)
 Reason for Consult: Tracheostomy Referring Physician: Kerrin Elspeth BROCKS, MD  Keith Gibbs is an 63 y.o. male.  HPI: Patient on ECMO, being treated for ARDS following chest surgery.  Anticipating prolonged ventilatory support.  No other pertinent history and no family around.  Past Medical History:  Diagnosis Date   Anginal pain    Anxiety    Arthritis    Bipolar 1 disorder (HCC)    Bursitis    CAD (coronary artery disease)    Stent placed 2017   [facility])    Chronic pain    COPD (chronic obstructive pulmonary disease) (HCC)    Current use of long term anticoagulation    DAPT (ASA + clopidogrel )   Depression    Diverticulitis    Dyspnea    GERD (gastroesophageal reflux disease)    Grade I diastolic dysfunction    Hepatitis C 2012   No longer has Hep C   HLD (hyperlipidemia)    Hypertension    MI (myocardial infarction) (HCC)    Polysubstance abuse (HCC)    cocaine, marijuana, ETOH   PUD (peptic ulcer disease)    S/P angioplasty with stent 06/10/2016   a.) 90% stenosis of pLAD to mLAD - 2.5 x 18 mm Xience Alpine (DES x 1) placed to pLAD   S/P PTCA (percutaneous transluminal coronary angioplasty) 12/04/2019   a.) 60% in stent restenosis of DES to pLAD; LVEF 65%.   Schizophrenia (HCC)    Stroke (HCC)    Tremors    generalized   Valvular insufficiency    a.) Mild MR, TR, PR; mild to moderate AR on 03/05/2018 TTE    Past Surgical History:  Procedure Laterality Date   ABDOMINAL SURGERY     removed small piece of intestines due to [facility] Diverticulosis   ANTERIOR LAT LUMBAR FUSION N/A 08/23/2022   Procedure: DIRECT LATERAL INTERBODY FUSION  LUMBAR TWO- LUMBAR THREE, LUMBAR THREE-LUMBAR FOUR , EXPLORE AND EXTEND FUSION LUMBAR TWO-LUMBAR FIVE, POSTERIOR DECOMPRESSION LUMBAR THREE-LUMBAR FOUR, LEFT LUMBAR TWO-LUMBAR THREE;  Surgeon: Debby Dorn MATSU, MD;  Location: [facility] OR;  Service: Neurosurgery;  Laterality: N/A;   APPENDECTOMY     BACK SURGERY     CARDIAC  CATHETERIZATION Left 06/10/2016   Procedure: Left Heart Cath and Coronary Angiography;  Surgeon: Denyse DELENA Bathe, MD;  Location: [facility] INVASIVE CV LAB;  Service: Cardiovascular;  Laterality: Left;   CARDIAC CATHETERIZATION N/A 06/10/2016   Procedure: Coronary Stent Intervention;  Surgeon: Cara JONETTA Lovelace, MD;  Location: [facility] INVASIVE CV LAB;  Service: Cardiovascular;  Laterality: N/A;   CHOLECYSTECTOMY N/A 02/17/2022   Procedure: LAPAROSCOPIC CHOLECYSTECTOMY;  Surgeon: Stevie Herlene Righter, MD;  Location: [facility] OR;  Service: General;  Laterality: N/A;   COLON SURGERY     COLONOSCOPY     COLONOSCOPY WITH PROPOFOL  N/A 01/05/2017   Procedure: COLONOSCOPY WITH PROPOFOL ;  Surgeon: Therisa Bi, MD;  Location: [facility] ENDOSCOPY;  Service: Endoscopy;  Laterality: N/A;   COLONOSCOPY WITH PROPOFOL  N/A 02/13/2020   Procedure: COLONOSCOPY WITH PROPOFOL ;  Surgeon: Janalyn Keene NOVAK, MD;  Location: [facility] ENDOSCOPY;  Service: Endoscopy;  Laterality: N/A;   CORONARY ANGIOPLASTY WITH STENT PLACEMENT     CORONARY PRESSURE/FFR STUDY N/A 12/04/2019   Procedure: INTRAVASCULAR PRESSURE WIRE/FFR STUDY;  Surgeon: Wonda Sharper, MD;  Location: [facility] INVASIVE CV LAB;  Service: Cardiovascular;  Laterality: N/A;   ECMO CANNULATION N/A 05/22/2024   Procedure: ECMO CANNULATION;  Surgeon: Cherrie Toribio SAUNDERS, MD;  [facility] INV[facility] LAB;  Service: Cardiovascular;  Laterality: N/A;   ESOPHAGOGASTRODUODENOSCOPY (EGD) WITH PROPOFOL  N/A 01/05/2017   Procedure: ESOPHAGOGASTRODUODENOSCOPY (EGD) WITH PROPOFOL ;  Surgeon: Therisa Bi, MD;  Location: [facility] ENDOSCOPY;  Service: Endoscopy;  Laterality: N/A;   ESOPHAGOGASTRODUODENOSCOPY (EGD) WITH PROPOFOL  N/A 02/13/2020   Procedure: ESOPHAGOGASTRODUODENOSCOPY (EGD) WITH PROPOFOL ;  Surgeon: Janalyn Keene NOVAK, MD;  Location: [facility] ENDOSCOPY;  Service: Endoscopy;  Laterality: N/A;   INTERCOSTAL NERVE BLOCK  05/09/2024   Procedure: BLOCK, NERVE, INTERCOSTAL;  Surgeon: Kerrin Elspeth BROCKS, MD;  Location: [facility] OR;  Service: Thoracic;;   KNEE ARTHROSCOPY WITH MEDIAL MENISECTOMY Right 09/05/2017   Procedure: KNEE ARTHROSCOPY WITH MEDIAL AND LATERAL  MENISECTOMY PARTIAL SYNOVECTOMY;  Surgeon: Kathlynn Sharper, MD;  Location: [facility] ORS;  Service: Orthopedics;  Laterality: Right;   LEFT HEART CATH AND CORONARY ANGIOGRAPHY N/A 12/04/2019   Procedure: LEFT HEART CATH AND CORONARY ANGIOGRAPHY;  Surgeon: Wonda Sharper, MD;  Location: [facility] INVASIVE CV LAB;  Service: Cardiovascular;  Laterality: N/A;   LEFT HEART CATH AND CORONARY ANGIOGRAPHY Left 11/25/2022   Procedure: LEFT HEART CATH AND CORONARY ANGIOGRAPHY;  Surgeon: Fernand Denyse LABOR, MD;  Location: [facility] INVASIVE CV LAB;  Service: Cardiovascular;  Laterality: Left;   LOBECTOMY, LUNG, ROBOT-ASSISTED, USING VATS Right 05/09/2024   Procedure: LOBECTOMY, LUNG RIGHT UPPER LOBE, ROBOT-ASSISTED, USING VATS;  Surgeon: Kerrin Elspeth BROCKS, MD;  Location: [facility] OR;  Service: Thoracic;  Laterality: Right;   LYMPH NODE BIOPSY  05/09/2024   Procedure: LYMPH NODE BIOPSY;  Surgeon: Kerrin Elspeth BROCKS, MD;  Location: [facility] OR;  Service: Thoracic;;   REVERSE SHOULDER ARTHROPLASTY Right 08/31/2023   Procedure: RIGHT REVERSE SHOULDER ARTHROPLASTY;  Surgeon: [name], MD;  [facility] OR;  Service: Orthopedics;  Laterality: Right;   SHOULDER SURGERY Right 04/09/2012   SPINE SURGERY     TOTAL KNEE ARTHROPLASTY Right 01/19/2021   Procedure: TOTAL KNEE ARTHROPLASTY - Medford Amber to Assist;  Surgeon: Kathlynn Sharper, MD;  Location: [facility] ORS;  Service: Orthopedics;  Laterality: Right;   WEDGE RESECTION, LUNG, ROBOT-ASSISTED, THORACOSCOPIC Right 05/09/2024   Procedure: WEDGE RESECTION, LUNG RIGHT UPPER LOBE, ROBOT-ASSISTED, THORACOSCOPIC;  Surgeon: Kerrin Elspeth BROCKS, MD;  [facility] OR;  Service: Thoracic;  Laterality: Right;    Family History  Problem Relation Age of Onset   Osteoarthritis Mother    Heart disease Mother    Hypertension Mother     Depression Mother    Heart disease Father    Early death Father    Hypertension Father    Heart attack Father    Hypertension Sister    Parkinson's disease Maternal Grandfather    Prostate cancer Neg Hx    Bladder Cancer Neg Hx    Kidney cancer Neg Hx    Tremor Neg Hx     Social History:  reports that he quit smoking about 25 years ago. His smoking use included cigarettes. He started smoking about 45 years ago. He has a 63.9 pack-year smoking history. He has been exposed to tobacco smoke. He has never used smokeless tobacco. He reports that he does not currently use alcohol  after a past usage of about 3.0 standard drinks of alcohol  per week. He reports that he does not currently use drugs after having used the following drugs: Cocaine and Marijuana.  Allergies:  Allergies  Allergen Reactions   Asenapine Other (See Comments) and Nausea And Vomiting    Increased tremors   Dextromethorphan Hbr    Guaifenesin     Latuda [Lurasidone Hcl] Other (See Comments)    Tremors  Lurasidone Other (See Comments) and Hives    Other Reaction(s): Angioedema   Phenylephrine      Medications: Reviewed  Results for orders placed or performed during the hospital encounter of 05/09/24 (from the past 48 hours)  I-STAT 7, (LYTES, BLD GAS, ICA, H+H)     Status: Abnormal   Collection Time: 05/22/24  9:40 AM  Result Value Ref Range   pH, Arterial 7.335 (L) 7.35 - 7.45   pCO2 arterial 61.0 (H) 32 - 48 mmHg   pO2, Arterial 90 83 - 108 mmHg   Bicarbonate 32.1 (H) 20.0 - 28.0 mmol/L   TCO2 34 (H) 22 - 32 mmol/L   O2 Saturation 95 %   Acid-Base Excess 5.0 (H) 0.0 - 2.0 mmol/L   Sodium 143 135 - 145 mmol/L   Potassium 5.0 3.5 - 5.1 mmol/L   Calcium , Ion 1.13 (L) 1.15 - 1.40 mmol/L   HCT 31.0 (L) 39.0 - 52.0 %   Hemoglobin 10.5 (L) 13.0 - 17.0 g/dL   Patient temperature 61.8 C    Sample type ARTERIAL   Glucose, capillary     Status: Abnormal   Collection Time: 05/22/24 12:42 PM  Result Value Ref  Range   Glucose-Capillary 113 (H) 70 - 99 mg/dL    Comment: Glucose reference range applies only to samples taken after fasting for at least 8 hours.  I-STAT 7, (LYTES, BLD GAS, ICA, H+H)     Status: Abnormal   Collection Time: 05/22/24  1:14 PM  Result Value Ref Range   pH, Arterial 7.187 (LL) 7.35 - 7.45   pCO2 arterial 90.4 (HH) 32 - 48 mmHg   pO2, Arterial 112 (H) 83 - 108 mmHg   Bicarbonate 33.7 (H) 20.0 - 28.0 mmol/L   TCO2 36 (H) 22 - 32 mmol/L   O2 Saturation 96 %   Acid-Base Excess 4.0 (H) 0.0 - 2.0 mmol/L   Sodium 143 135 - 145 mmol/L   Potassium 5.1 3.5 - 5.1 mmol/L   Calcium , Ion 1.18 1.15 - 1.40 mmol/L   HCT 31.0 (L) 39.0 - 52.0 %   Hemoglobin 10.5 (L) 13.0 - 17.0 g/dL   Patient temperature 61.6 C    Collection site art line    Drawn by Operator    Sample type ARTERIAL    Comment NOTIFIED PHYSICIAN   I-STAT 7, (LYTES, BLD GAS, ICA, H+H)     Status: Abnormal   Collection Time: 05/22/24  2:38 PM  Result Value Ref Range   pH, Arterial 7.141 (LL) 7.35 - 7.45   pCO2 arterial 99.5 (HH) 32 - 48 mmHg   pO2, Arterial 135 (H) 83 - 108 mmHg   Bicarbonate 33.4 (H) 20.0 - 28.0 mmol/L   TCO2 36 (H) 22 - 32 mmol/L   O2 Saturation 97 %   Acid-Base Excess 2.0 0.0 - 2.0 mmol/L   Sodium 144 135 - 145 mmol/L   Potassium 5.5 (H) 3.5 - 5.1 mmol/L   Calcium , Ion 1.16 1.15 - 1.40 mmol/L   HCT 32.0 (L) 39.0 - 52.0 %   Hemoglobin 10.9 (L) 13.0 - 17.0 g/dL   Patient temperature 61.9 C    Sample type ARTERIAL    Comment NOTIFIED PHYSICIAN   POCT Activated clotting time     Status: None   Collection Time: 05/22/24  3:53 PM  Result Value Ref Range   Activated Clotting Time 135 seconds    Comment: Reference range 74-137 seconds for patients not on anticoagulant therapy.  I-STAT 7, (  LYTES, BLD GAS, ICA, H+H)     Status: Abnormal   Collection Time: 05/22/24  4:09 PM  Result Value Ref Range   pH, Arterial 7.150 (LL) 7.35 - 7.45   pCO2 arterial 100.8 (HH) 32 - 48 mmHg   pO2, Arterial 125  (H) 83 - 108 mmHg   Bicarbonate 34.6 (H) 20.0 - 28.0 mmol/L   TCO2 38 (H) 22 - 32 mmol/L   O2 Saturation 97 %   Acid-Base Excess 4.0 (H) 0.0 - 2.0 mmol/L   Sodium 143 135 - 145 mmol/L   Potassium 5.8 (H) 3.5 - 5.1 mmol/L   Calcium , Ion 1.17 1.15 - 1.40 mmol/L   HCT 33.0 (L) 39.0 - 52.0 %   Hemoglobin 11.2 (L) 13.0 - 17.0 g/dL   Patient temperature 61.9 C    Sample type ARTERIAL    Comment NOTIFIED PHYSICIAN   CG4 I-STAT (Lactic acid)     Status: None   Collection Time: 05/22/24  4:09 PM  Result Value Ref Range   Lactic Acid, Venous 1.0 0.5 - 1.9 mmol/L  I-STAT 7, (LYTES, BLD GAS, ICA, H+H)     Status: Abnormal   Collection Time: 05/22/24  4:27 PM  Result Value Ref Range   pH, Arterial 7.181 (LL) 7.35 - 7.45   pCO2 arterial 85.2 (HH) 32 - 48 mmHg   pO2, Arterial 257 (H) 83 - 108 mmHg   Bicarbonate 31.4 (H) 20.0 - 28.0 mmol/L   TCO2 34 (H) 22 - 32 mmol/L   O2 Saturation 100 %   Acid-Base Excess 2.0 0.0 - 2.0 mmol/L   Sodium 144 135 - 145 mmol/L   Potassium 5.9 (H) 3.5 - 5.1 mmol/L   Calcium , Ion 1.08 (L) 1.15 - 1.40 mmol/L   HCT 27.0 (L) 39.0 - 52.0 %   Hemoglobin 9.2 (L) 13.0 - 17.0 g/dL   Patient temperature 61.9 C    Sample type ARTERIAL    Comment NOTIFIED PHYSICIAN   POCT Activated clotting time     Status: None   Collection Time: 05/22/24  4:32 PM  Result Value Ref Range   Activated Clotting Time 152 seconds    Comment: Reference range 74-137 seconds for patients not on anticoagulant therapy.  I-STAT 7, (LYTES, BLD GAS, ICA, H+H)     Status: Abnormal   Collection Time: 05/22/24  4:44 PM  Result Value Ref Range   pH, Arterial 7.396 7.35 - 7.45   pCO2 arterial 50.9 (H) 32 - 48 mmHg   pO2, Arterial 308 (H) 83 - 108 mmHg   Bicarbonate 30.9 (H) 20.0 - 28.0 mmol/L   TCO2 32 22 - 32 mmol/L   O2 Saturation 100 %   Acid-Base Excess 6.0 (H) 0.0 - 2.0 mmol/L   Sodium 142 135 - 145 mmol/L   Potassium 5.8 (H) 3.5 - 5.1 mmol/L   Calcium , Ion 1.08 (L) 1.15 - 1.40 mmol/L    HCT 27.0 (L) 39.0 - 52.0 %   Hemoglobin 9.2 (L) 13.0 - 17.0 g/dL   Patient temperature 61.9 C    Sample type ARTERIAL   POCT Activated clotting time     Status: None   Collection Time: 05/22/24  4:46 PM  Result Value Ref Range   Activated Clotting Time 210 seconds    Comment: Reference range 74-137 seconds for patients not on anticoagulant therapy.  POCT Activated clotting time     Status: None   Collection Time: 05/22/24  5:04 PM  Result Value Ref Range  Activated Clotting Time 210 seconds    Comment: Reference range 74-137 seconds for patients not on anticoagulant therapy.  I-STAT 7, (LYTES, BLD GAS, ICA, H+H)     Status: Abnormal   Collection Time: 05/22/24  5:05 PM  Result Value Ref Range   pH, Arterial 7.498 (H) 7.35 - 7.45   pCO2 arterial 38.0 32 - 48 mmHg   pO2, Arterial 236 (H) 83 - 108 mmHg   Bicarbonate 29.5 (H) 20.0 - 28.0 mmol/L   TCO2 31 22 - 32 mmol/L   O2 Saturation 100 %   Acid-Base Excess 6.0 (H) 0.0 - 2.0 mmol/L   Sodium 142 135 - 145 mmol/L   Potassium 5.8 (H) 3.5 - 5.1 mmol/L   Calcium , Ion 1.07 (L) 1.15 - 1.40 mmol/L   HCT 26.0 (L) 39.0 - 52.0 %   Hemoglobin 8.8 (L) 13.0 - 17.0 g/dL   Patient temperature 62.9 C    Sample type ARTERIAL   CBC     Status: Abnormal   Collection Time: 05/22/24  5:10 PM  Result Value Ref Range   WBC 22.9 (H) 4.0 - 10.5 K/uL   RBC 2.92 (L) 4.22 - 5.81 MIL/uL   Hemoglobin 9.4 (L) 13.0 - 17.0 g/dL   HCT 69.1 (L) 60.9 - 47.9 %   MCV 105.5 (H) 80.0 - 100.0 fL   MCH 32.2 26.0 - 34.0 pg   MCHC 30.5 30.0 - 36.0 g/dL   RDW 85.0 88.4 - 84.4 %   Platelets 201 150 - 400 K/uL   nRBC 0.3 (H) 0.0 - 0.2 %    Comment: Performed at [facility] Lab, [address] [place], [place] [address]  Basic metabolic panel     Status: Abnormal   Collection Time: 05/22/24  5:10 PM  Result Value Ref Range   Sodium 143 135 - 145 mmol/L   Potassium 5.3 (H) 3.5 - 5.1 mmol/L   Chloride 104 98 - 111 mmol/L   CO2 29 22 - 32 mmol/L   Glucose, Bld  200 (H) 70 - 99 mg/dL    Comment: Glucose reference range applies only to samples taken after fasting for at least 8 hours.   BUN 51 (H) 8 - 23 mg/dL   Creatinine, Ser 8.88 0.61 - 1.24 mg/dL   Calcium  7.5 (L) 8.9 - 10.3 mg/dL   GFR, Estimated >39 >39 mL/min    Comment: (NOTE) Calculated using the CKD-EPI Creatinine Equation (2021)    Anion gap 10 5 - 15    Comment: Performed at [facility] Lab, [address] [place], [place] [address]  Lactate dehydrogenase     Status: Abnormal   Collection Time: 05/22/24  5:10 PM  Result Value Ref Range   LDH 544 (H) 98 - 192 U/L    Comment: Performed at [facility] Lab, [address], [place], [place] [address]  APTT     Status: Abnormal   Collection Time: 05/22/24  5:10 PM  Result Value Ref Range   aPTT >200 (HH) 24 - 36 seconds    Comment:        IF BASELINE aPTT IS ELEVATED, SUGGEST PATIENT RISK ASSESSMENT BE USED TO DETERMINE APPROPRIATE ANTICOAGULANT THERAPY. REPEATED TO VERIFY CRITICAL RESULT CALLED TO, READ BACK BY AND VERIFIED WITH: A. BORDERS, RN AT [phone] 10.29.25 DSABRA CATHOLIC Performed at [facility] Lab, [address], [place], [place] [address]   Protime-INR     Status: Abnormal   Collection Time: 05/22/24  5:10 PM  Result Value Ref Range   Prothrombin Time 16.8 (H) 11.4 - 15.2 seconds   INR 1.3 (H) 0.8 - 1.2    Comment: (NOTE) INR goal varies based on device and disease states. Performed at [facility] Lab, [address] [place], [place] [address]   Heparin  level (unfractionated)     Status: Abnormal   Collection Time: 05/22/24  5:10 PM  Result Value Ref Range   Heparin  Unfractionated >1.10 (H) 0.30 - 0.70 IU/mL    Comment: (NOTE) The clinical reportable range upper limit is being lowered to >1.10 to align with the FDA approved guidance for the current laboratory assay.  If heparin  results are below expected values, and patient dosage has  been confirmed, suggest follow up testing of antithrombin III  levels. Performed at [facility] Lab, [address] [place], [place] [address]   Fibrinogen     Status: Abnormal   Collection Time: 05/22/24  5:10 PM  Result Value Ref Range   Fibrinogen 490 (H) 210 - 475 mg/dL    Comment: (NOTE) Fibrinogen results may be underestimated in patients receiving thrombolytic therapy. Performed at [facility] Lab, [address] [place], [place] [address]   Hepatic function panel     Status: Abnormal   Collection Time: 05/22/24  5:10 PM  Result Value Ref Range   Total Protein 5.1 (L) 6.5 - 8.1 g/dL   Albumin  1.6 (L) 3.5 - 5.0 g/dL   AST 54 (H) 15 - 41 U/L   ALT 28 0 - 44 U/L   Alkaline Phosphatase 79 38 - 126 U/L   Total Bilirubin 0.6 0.0 - 1.2 mg/dL   Bilirubin, Direct 0.3 (H) 0.0 - 0.2 mg/dL   Indirect Bilirubin 0.3 0.3 - 0.9 mg/dL    Comment: Performed at [facility] Lab, [address] [place], [place] [address]  Type and screen [facility]     Status: None (Preliminary result)   Collection Time: 05/22/24  5:11 PM  Result Value Ref Range   ABO/RH(D) O POS    Antibody Screen NEG    Sample Expiration 05/25/2024,2359    Unit Number [id number]    Blood Component Type RED CELLS,LR    Unit division 00    Status of Unit ALLOCATED    Transfusion Status OK TO TRANSFUSE    Crossmatch Result      Compatible Performed at [facility] Lab, [address], [place], [place] [address]    Unit Number [id number]    Blood Component Type RED CELLS,LR    Unit division 00    Status of Unit ALLOCATED    Transfusion Status OK TO TRANSFUSE    Crossmatch Result Compatible    Unit Number [id number]    Blood Component Type RED CELLS,LR    Unit division 00    Status of Unit ALLOCATED    Transfusion Status OK TO TRANSFUSE    Crossmatch Result Compatible    Unit Number [id number]    Blood Component Type RED CELLS,LR    Unit division 00    Status of Unit ALLOCATED    Transfusion Status OK TO TRANSFUSE    Crossmatch  Result Compatible    Unit Number [id number]    Blood Component Type RED CELLS,LR    Unit division 00    Status of Unit ALLOCATED    Transfusion Status OK TO TRANSFUSE    Crossmatch Result Compatible    Unit Number [id number]  Blood Component Type RBC LR PHER1    Unit division 00    Status of Unit ALLOCATED    Transfusion Status OK TO TRANSFUSE    Crossmatch Result Compatible   Global TEG Panel     Status: None   Collection Time: 05/22/24  5:11 PM  Result Value Ref Range   Citrated Kaolin (R) NOT CALCULATED 4.6 - 9.1 min    Comment: RESULT CALLED TO, READ BACK BY AND VERIFIED WITH: FABIENE NETT, RN @ 19:25 05/22/24 S. MEEKS    Citrated Kaolin (K) NOT CALCULATED 0.8 - 2.1 min    Comment: RESULT CALLED TO, READ BACK BY AND VERIFIED WITH: FABIENE NETT, RN @ 19:25 05/22/24 S. MEEKS    Citrated Kaolin Angle NOT CALCULATED 63 - 78 deg    Comment: RESULT CALLED TO, READ BACK BY AND VERIFIED WITH: FABIENE NETT, RN @ 19:25 05/22/24 S. MEEKS    Citrated Kaolin (MA) NOT CALCULATED 52 - 69 mm    Comment: RESULT CALLED TO, READ BACK BY AND VERIFIED WITH: FABIENE NETT, RN @ 19:25 05/22/24 S. MEEKS    Citrated Rapid TEG (MA) 67.5 52 - 70 mm   CK with Heparinase (R) 7.2 4.3 - 8.3 min   CFF Max Amplitude 16.1 15 - 32 mm   Citrated Functional Fibrinogen 293.8 278 - 581 mg/dL    Comment: Performed at [facility] Lab, [address], [place], [place] [address]  Prepare RBC (crossmatch)     Status: None   Collection Time: 05/22/24  5:30 PM  Result Value Ref Range   Order Confirmation      ORDER PROCESSED BY BLOOD BANK Performed at [facility] Lab, [address], [place], [place] [address]   CG4 I-STAT (Lactic acid)     Status: None   Collection Time: 05/22/24  5:30 PM  Result Value Ref Range   Lactic Acid, Venous 1.6 0.5 - 1.9 mmol/L  Glucose, capillary     Status: Abnormal   Collection Time: 05/22/24  5:39 PM  Result Value Ref Range   Glucose-Capillary 182 (H) 70 - 99 mg/dL     Comment: Glucose reference range applies only to samples taken after fasting for at least 8 hours.  Body fluid cell count with differential     Status: Abnormal   Collection Time: 05/22/24  6:45 PM  Result Value Ref Range   Fluid Type-FCT Bronch Lavag    Color, Fluid WHITE    Appearance, Fluid TURBID (A) CLEAR   Total Nucleated Cell Count, Fluid 2,450 (H) 0 - 1,000 cu mm   Neutrophil Count, Fluid 100 (H) 0 - 25 %   Lymphs, Fluid 0 %   Monocyte-Macrophage-Serous Fluid 0 (L) 50 - 90 %   Eos, Fluid 0 %   Other Cells, Fluid BUDDING YEAST SEEN %    Comment: Performed at [facility] Lab, [address], [place], [place] [address]  Culture, Respiratory w Gram Stain     Status: None (Preliminary result)   Collection Time: 05/22/24  6:45 PM   Specimen: Bronchoalveolar Lavage; Respiratory  Result Value Ref Range   Specimen Description BRONCHIAL ALVEOLAR LAVAGE    Special Requests NONE    Gram Stain      FEW WBC PRESENT, PREDOMINANTLY PMN FEW BUDDING YEAST SEEN    Culture      MODERATE YEAST CULTURE REINCUBATED FOR BETTER GROWTH Performed at [facility] Lab, [address], [place], [place] [address]  Report Status PENDING   Pneumocystis smear by DFA     Status: None   Collection Time: 05/22/24  6:45 PM   Specimen: Bronchoalveolar Lavage; Respiratory  Result Value Ref Range   Specimen Source-[facility] BRONCHIAL ALVEOLAR LAVAGE    Pneumocystis jiroveci Ag See Scanned report in [place] [employer]     Comment: Performed at [facility] Lab, [address], [place], [place] [address]  Heparin  level (unfractionated)     Status: Abnormal   Collection Time: 05/22/24  6:45 PM  Result Value Ref Range   Heparin  Unfractionated >1.10 (H) 0.30 - 0.70 IU/mL    Comment: (NOTE) The clinical reportable range upper limit is being lowered to >1.10 to align with the FDA approved guidance for the current laboratory assay.  If heparin  results are below expected values, and patient dosage has   been confirmed, suggest follow up testing of antithrombin III levels. Performed at [facility] Lab, [address], [place], [place] [address]   I-STAT 7, (LYTES, BLD GAS, ICA, H+H)     Status: Abnormal   Collection Time: 05/22/24  7:04 PM  Result Value Ref Range   pH, Arterial 7.440 7.35 - 7.45   pCO2 arterial 44.0 32 - 48 mmHg   pO2, Arterial 80 (L) 83 - 108 mmHg   Bicarbonate 29.9 (H) 20.0 - 28.0 mmol/L   TCO2 31 22 - 32 mmol/L   O2 Saturation 96 %   Acid-Base Excess 5.0 (H) 0.0 - 2.0 mmol/L   Sodium 142 135 - 145 mmol/L   Potassium 5.7 (H) 3.5 - 5.1 mmol/L   Calcium , Ion 1.10 (L) 1.15 - 1.40 mmol/L   HCT 26.0 (L) 39.0 - 52.0 %   Hemoglobin 8.8 (L) 13.0 - 17.0 g/dL   Sample type ARTERIAL   Glucose, capillary     Status: Abnormal   Collection Time: 05/22/24  7:45 PM  Result Value Ref Range   Glucose-Capillary 184 (H) 70 - 99 mg/dL    Comment: Glucose reference range applies only to samples taken after fasting for at least 8 hours.  Global TEG Panel     Status: None   Collection Time: 05/22/24  7:46 PM  Result Value Ref Range   Citrated Kaolin (R) NOT CALCULATED 4.6 - 9.1 min   Citrated Kaolin (K) NOT CALCULATED 0.8 - 2.1 min   Citrated Kaolin Angle NOT CALCULATED 63 - 78 deg   Citrated Kaolin (MA) NOT CALCULATED 52 - 69 mm   Citrated Rapid TEG (MA) 68.7 52 - 70 mm   CK with Heparinase (R) 7.4 4.3 - 8.3 min   CFF Max Amplitude 31.7 15 - 32 mm   Citrated Functional Fibrinogen 578.5 278 - 581 mg/dL    Comment: Performed at [facility] Lab, [address], [place], [place] [address]  CBC     Status: Abnormal   Collection Time: 05/22/24 10:43 PM  Result Value Ref Range   WBC 23.7 (H) 4.0 - 10.5 K/uL   RBC 2.88 (L) 4.22 - 5.81 MIL/uL   Hemoglobin 9.4 (L) 13.0 - 17.0 g/dL   HCT 70.4 (L) 60.9 - 47.9 %   MCV 102.4 (H) 80.0 - 100.0 fL   MCH 32.6 26.0 - 34.0 pg   MCHC 31.9 30.0 - 36.0 g/dL   RDW 85.0 88.4 - 84.4 %   Platelets 243 150 - 400 K/uL   nRBC 0.3 (H) 0.0 - 0.2  %    Comment: Performed at [facility]  [facility], [address] [place], [place] [address]  APTT     Status: Abnormal   Collection Time: 05/22/24 10:43 PM  Result Value Ref Range   aPTT 65 (H) 24 - 36 seconds    Comment:        IF BASELINE aPTT IS ELEVATED, SUGGEST PATIENT RISK ASSESSMENT BE USED TO DETERMINE APPROPRIATE ANTICOAGULANT THERAPY. Performed at [facility] Lab, [address] [place], [place] [address]   Glucose, capillary     Status: Abnormal   Collection Time: 05/22/24 11:24 PM  Result Value Ref Range   Glucose-Capillary 180 (H) 70 - 99 mg/dL    Comment: Glucose reference range applies only to samples taken after fasting for at least 8 hours.  APTT     Status: Abnormal   Collection Time: 05/23/24  3:00 AM  Result Value Ref Range   aPTT 58 (H) 24 - 36 seconds    Comment:        IF BASELINE aPTT IS ELEVATED, SUGGEST PATIENT RISK ASSESSMENT BE USED TO DETERMINE APPROPRIATE ANTICOAGULANT THERAPY. Performed at [facility], [address], [place], [place] [address]   Heparin  level (unfractionated)     Status: None   Collection Time: 05/23/24  3:00 AM  Result Value Ref Range   Heparin  Unfractionated 0.33 0.30 - 0.70 IU/mL    Comment: (NOTE) The clinical reportable range upper limit is being lowered to >1.10 to align with the FDA approved guidance for the current laboratory assay.  If heparin  results are below expected values, and patient dosage has  been confirmed, suggest follow up testing of antithrombin III levels. Performed at [facility], [address], [place], [place] [address]   Glucose, capillary     Status: Abnormal   Collection Time: 05/23/24  3:35 AM  Result Value Ref Range   Glucose-Capillary 122 (H) 70 - 99 mg/dL    Comment: Glucose reference range applies only to samples taken after fasting for at least 8 hours.  CBC with Differential/Platelet     Status: Abnormal   Collection Time: 05/23/24  4:45 AM  Result Value Ref Range    WBC 26.7 (H) 4.0 - 10.5 K/uL   RBC 2.74 (L) 4.22 - 5.81 MIL/uL   Hemoglobin 8.9 (L) 13.0 - 17.0 g/dL   HCT 71.9 (L) 60.9 - 47.9 %   MCV 102.2 (H) 80.0 - 100.0 fL   MCH 32.5 26.0 - 34.0 pg   MCHC 31.8 30.0 - 36.0 g/dL   RDW 85.1 88.4 - 84.4 %   Platelets 263 150 - 400 K/uL   nRBC 0.3 (H) 0.0 - 0.2 %   Neutrophils Relative % 69 %   Neutro Abs 18.6 (H) 1.7 - 7.7 K/uL   Lymphocytes Relative 10 %   Lymphs Abs 2.6 0.7 - 4.0 K/uL   Monocytes Relative 9 %   Monocytes Absolute 2.3 (H) 0.1 - 1.0 K/uL   Eosinophils Relative 1 %   Eosinophils Absolute 0.1 0.0 - 0.5 K/uL   Basophils Relative 0 %   Basophils Absolute 0.0 0.0 - 0.1 K/uL   WBC Morphology See Note     Comment: Mild Left Shift (1-5% metas, occ myelo)   Smear Review Normal platelet morphology    Immature Granulocytes 11 %   Abs Immature Granulocytes 3.03 (H) 0.00 - 0.07 K/uL   Polychromasia PRESENT     Comment: Performed at [facility], [address], [place], [place] [address]  Basic metabolic panel with GFR     Status: Abnormal   Collection Time: 05/23/24  4:45 AM  Result Value Ref Range   Sodium 146 (H) 135 - 145 mmol/L   Potassium 5.3 (H) 3.5 - 5.1 mmol/L   Chloride 106 98 - 111 mmol/L   CO2 29 22 - 32 mmol/L   Glucose, Bld 113 (H) 70 - 99 mg/dL    Comment: Glucose reference range applies only to samples taken after fasting for at least 8 hours.   BUN 46 (H) 8 - 23 mg/dL   Creatinine, Ser 9.11 0.61 - 1.24 mg/dL   Calcium  7.5 (L) 8.9 - 10.3 mg/dL   GFR, Estimated >39 >39 mL/min    Comment: (NOTE) Calculated using the CKD-EPI Creatinine Equation (2021)    Anion gap 11 5 - 15    Comment: Performed at [facility] Lab, [address] [place], [place] [address]  Magnesium      Status: Abnormal   Collection Time: 05/23/24  4:45 AM  Result Value Ref Range   Magnesium  3.1 (H) 1.7 - 2.4 mg/dL    Comment: Performed at [facility], [address], [place], [place] [address]  Phosphorus     Status: None    Collection Time: 05/23/24  4:45 AM  Result Value Ref Range   Phosphorus 3.1 2.5 - 4.6 mg/dL    Comment: Performed at [facility], [address], [place], [place] [address]  Triglycerides     Status: None   Collection Time: 05/23/24  4:45 AM  Result Value Ref Range   Triglycerides 97 <150 mg/dL    Comment: Performed at [facility], [address] [place], [place] [address]  Lactate dehydrogenase     Status: Abnormal   Collection Time: 05/23/24  4:45 AM  Result Value Ref Range   LDH 478 (H) 98 - 192 U/L    Comment: Performed at [facility], [address], [place], [place] [address]  Protime-INR     Status: Abnormal   Collection Time: 05/23/24  4:45 AM  Result Value Ref Range   Prothrombin Time 15.3 (H) 11.4 - 15.2 seconds   INR 1.1 0.8 - 1.2    Comment: (NOTE) INR goal varies based on device and disease states. Performed at [facility], [address], [place], [place] [address]   Fibrinogen     Status: Abnormal   Collection Time: 05/23/24  4:45 AM  Result Value Ref Range   Fibrinogen 551 (H) 210 - 475 mg/dL    Comment: (NOTE) Fibrinogen results may be underestimated in patients receiving thrombolytic therapy. Performed at [facility], [address], [place], [place] [address]   Hepatic function panel     Status: Abnormal   Collection Time: 05/23/24  4:45 AM  Result Value Ref Range   Total Protein 5.2 (L) 6.5 - 8.1 g/dL   Albumin  1.8 (L) 3.5 - 5.0 g/dL   AST 31 15 - 41 U/L   ALT 23 0 - 44 U/L   Alkaline Phosphatase 79 38 - 126 U/L   Total Bilirubin 0.5 0.0 - 1.2 mg/dL   Bilirubin, Direct 0.1 0.0 - 0.2 mg/dL   Indirect Bilirubin 0.4 0.3 - 0.9 mg/dL    Comment: Performed at [facility] Lab, [address], [place], [place] [address]  I-STAT 7, (LYTES, BLD GAS, ICA, H+H)     Status: Abnormal   Collection Time: 05/23/24  4:53 AM  Result Value Ref Range   pH, Arterial 7.402 7.35 - 7.45   pCO2 arterial 51.9 (H) 32 - 48 mmHg   pO2,  Arterial 68 (L) 83 - 108 mmHg   Bicarbonate 32.3 (H) 20.0 - 28.0 mmol/L   TCO2 34 (H) 22 - 32 mmol/L   O2 Saturation 93 %   Acid-Base Excess 7.0 (H) 0.0 - 2.0 mmol/L   Sodium 145 135 - 145 mmol/L   Potassium 5.2 (H) 3.5 - 5.1 mmol/L   Calcium , Ion 1.13 (L) 1.15 - 1.40 mmol/L   HCT 25.0 (L) 39.0 - 52.0 %   Hemoglobin 8.5 (L) 13.0 - 17.0 g/dL   Patient temperature 62.8 C    Sample type ARTERIAL   CG4 I-STAT (Lactic acid)     Status: None   Collection Time: 05/23/24  4:58 AM  Result Value Ref Range   Lactic Acid, Venous 1.1 0.5 - 1.9 mmol/L  Glucose, capillary     Status: Abnormal   Collection Time: 05/23/24  7:43 AM  Result Value Ref Range   Glucose-Capillary 117 (H) 70 - 99 mg/dL    Comment: Glucose reference range applies only to samples taken after fasting for at least 8 hours.  Glucose, capillary     Status: None   Collection Time: 05/23/24 11:23 AM  Result Value Ref Range   Glucose-Capillary 75 70 - 99 mg/dL    Comment: Glucose reference range applies only to samples taken after fasting for at least 8 hours.  APTT     Status: None   Collection Time: 05/23/24 12:30 PM  Result Value Ref Range   aPTT 35 24 - 36 seconds    Comment: Performed at [facility], [address] [place], [place] [address]  I-STAT 7, (LYTES, BLD GAS, ICA, H+H)     Status: Abnormal   Collection Time: 05/23/24 12:33 PM  Result Value Ref Range   pH, Arterial 7.432 7.35 - 7.45   pCO2 arterial 47.0 32 - 48 mmHg   pO2, Arterial 64 (L) 83 - 108 mmHg   Bicarbonate 31.3 (H) 20.0 - 28.0 mmol/L   TCO2 33 (H) 22 - 32 mmol/L   O2 Saturation 92 %   Acid-Base Excess 6.0 (H) 0.0 - 2.0 mmol/L   Sodium 144 135 - 145 mmol/L   Potassium 4.6 3.5 - 5.1 mmol/L   Calcium , Ion 1.16 1.15 - 1.40 mmol/L   HCT 26.0 (L) 39.0 - 52.0 %   Hemoglobin 8.8 (L) 13.0 - 17.0 g/dL   Patient temperature 62.8 C    Sample type ARTERIAL   Glucose, capillary     Status: None   Collection Time: 05/23/24  3:31 PM  Result Value Ref  Range   Glucose-Capillary 97 70 - 99 mg/dL    Comment: Glucose reference range applies only to samples taken after fasting for at least 8 hours.  CBC     Status: Abnormal   Collection Time: 05/23/24  5:17 PM  Result Value Ref Range   WBC 18.9 (H) 4.0 - 10.5 K/uL   RBC 2.75 (L) 4.22 - 5.81 MIL/uL   Hemoglobin 8.9 (L) 13.0 - 17.0 g/dL   HCT 71.5 (L) 60.9 - 47.9 %   MCV 103.3 (H) 80.0 - 100.0 fL   MCH 32.4 26.0 - 34.0 pg   MCHC 31.3 30.0 - 36.0 g/dL   RDW 85.0 88.4 - 84.4 %   Platelets 223 150 - 400 K/uL   nRBC 0.2  0.0 - 0.2 %    Comment: Performed at [facility], [address] [place], [place] [address]  Basic metabolic panel     Status: Abnormal   Collection Time: 05/23/24  5:17 PM  Result Value Ref Range   Sodium 145 135 - 145 mmol/L   Potassium 5.0 3.5 - 5.1 mmol/L   Chloride 106 98 - 111 mmol/L   CO2 29 22 - 32 mmol/L   Glucose, Bld 160 (H) 70 - 99 mg/dL    Comment: Glucose reference range applies only to samples taken after fasting for at least 8 hours.   BUN 50 (H) 8 - 23 mg/dL   Creatinine, Ser 8.99 0.61 - 1.24 mg/dL   Calcium  7.8 (L) 8.9 - 10.3 mg/dL   GFR, Estimated >39 >39 mL/min    Comment: (NOTE) Calculated using the CKD-EPI Creatinine Equation (2021)    Anion gap 10 5 - 15    Comment: Performed at [facility], [address], [place], [place] [address]  I-STAT 7, (LYTES, BLD GAS, ICA, H+H)     Status: Abnormal   Collection Time: 05/23/24  5:24 PM  Result Value Ref Range   pH, Arterial 7.419 7.35 - 7.45   pCO2 arterial 48.6 (H) 32 - 48 mmHg   pO2, Arterial 57 (L) 83 - 108 mmHg   Bicarbonate 31.4 (H) 20.0 - 28.0 mmol/L   TCO2 33 (H) 22 - 32 mmol/L   O2 Saturation 89 %   Acid-Base Excess 6.0 (H) 0.0 - 2.0 mmol/L   Sodium 145 135 - 145 mmol/L   Potassium 5.0 3.5 - 5.1 mmol/L   Calcium , Ion 1.15 1.15 - 1.40 mmol/L   HCT 25.0 (L) 39.0 - 52.0 %   Hemoglobin 8.5 (L) 13.0 - 17.0 g/dL   Patient temperature 62.8 C    Sample type ARTERIAL   CG4  I-STAT (Lactic acid)     Status: None   Collection Time: 05/23/24  5:34 PM  Result Value Ref Range   Lactic Acid, Venous 1.3 0.5 - 1.9 mmol/L  APTT     Status: None   Collection Time: 05/23/24  6:52 PM  Result Value Ref Range   aPTT 36 24 - 36 seconds    Comment: Performed at [facility], [address] [place], [place] [address]  Glucose, capillary     Status: Abnormal   Collection Time: 05/23/24  8:14 PM  Result Value Ref Range   Glucose-Capillary 145 (H) 70 - 99 mg/dL    Comment: Glucose reference range applies only to samples taken after fasting for at least 8 hours.  I-STAT 7, (LYTES, BLD GAS, ICA, H+H)     Status: Abnormal   Collection Time: 05/23/24  8:15 PM  Result Value Ref Range   pH, Arterial 7.421 7.35 - 7.45   pCO2 arterial 45.0 32 - 48 mmHg   pO2, Arterial 59 (L) 83 - 108 mmHg   Bicarbonate 29.2 (H) 20.0 - 28.0 mmol/L   TCO2 31 22 - 32 mmol/L   O2 Saturation 90 %   Acid-Base Excess 4.0 (H) 0.0 - 2.0 mmol/L   Sodium 145 135 - 145 mmol/L   Potassium 4.8 3.5 - 5.1 mmol/L   Calcium , Ion 1.12 (L) 1.15 - 1.40 mmol/L   HCT 24.0 (L) 39.0 - 52.0 %   Hemoglobin 8.2 (L) 13.0 - 17.0 g/dL   Patient temperature 62.8 C    Sample type ARTERIAL   Glucose, capillary  Status: None   Collection Time: 05/23/24 11:47 PM  Result Value Ref Range   Glucose-Capillary 92 70 - 99 mg/dL    Comment: Glucose reference range applies only to samples taken after fasting for at least 8 hours.  APTT     Status: Abnormal   Collection Time: 05/23/24 11:55 PM  Result Value Ref Range   aPTT 39 (H) 24 - 36 seconds    Comment:        IF BASELINE aPTT IS ELEVATED, SUGGEST PATIENT RISK ASSESSMENT BE USED TO DETERMINE APPROPRIATE ANTICOAGULANT THERAPY. Performed at [facility], [address], [place], [place] [address]   CBC with Differential/Platelet     Status: Abnormal   Collection Time: 05/24/24  4:01 AM  Result Value Ref Range   WBC 21.2 (H) 4.0 - 10.5 K/uL   RBC 2.80 (L)  4.22 - 5.81 MIL/uL   Hemoglobin 9.1 (L) 13.0 - 17.0 g/dL   HCT 70.7 (L) 60.9 - 47.9 %   MCV 104.3 (H) 80.0 - 100.0 fL   MCH 32.5 26.0 - 34.0 pg   MCHC 31.2 30.0 - 36.0 g/dL   RDW 84.9 88.4 - 84.4 %   Platelets 248 150 - 400 K/uL   nRBC 0.4 (H) 0.0 - 0.2 %   Neutrophils Relative % 73 %   Neutro Abs 15.5 (H) 1.7 - 7.7 K/uL   Lymphocytes Relative 11 %   Lymphs Abs 2.3 0.7 - 4.0 K/uL   Monocytes Relative 8 %   Monocytes Absolute 1.6 (H) 0.1 - 1.0 K/uL   Eosinophils Relative 1 %   Eosinophils Absolute 0.2 0.0 - 0.5 K/uL   Basophils Relative 0 %   Basophils Absolute 0.1 0.0 - 0.1 K/uL   WBC Morphology See Note     Comment: Mild Left Shift (1-5% metas, occ myelo)   Smear Review Normal platelet morphology    Immature Granulocytes 7 %   Abs Immature Granulocytes 1.56 (H) 0.00 - 0.07 K/uL   Polychromasia PRESENT     Comment: Performed at [facility], [address], [place], [place] [address]  Basic metabolic panel with GFR     Status: Abnormal   Collection Time: 05/24/24  4:01 AM  Result Value Ref Range   Sodium 147 (H) 135 - 145 mmol/L   Potassium 5.1 3.5 - 5.1 mmol/L   Chloride 107 98 - 111 mmol/L   CO2 29 22 - 32 mmol/L   Glucose, Bld 109 (H) 70 - 99 mg/dL    Comment: Glucose reference range applies only to samples taken after fasting for at least 8 hours.   BUN 50 (H) 8 - 23 mg/dL   Creatinine, Ser 9.15 0.61 - 1.24 mg/dL   Calcium  8.2 (L) 8.9 - 10.3 mg/dL   GFR, Estimated >39 >39 mL/min    Comment: (NOTE) Calculated using the CKD-EPI Creatinine Equation (2021)    Anion gap 11 5 - 15    Comment: Performed at [facility], [address], [place], [place] [address]  Magnesium      Status: Abnormal   Collection Time: 05/24/24  4:01 AM  Result Value Ref Range   Magnesium  3.0 (H) 1.7 - 2.4 mg/dL    Comment: Performed at [facility], [address], [place], [place] [address]  Phosphorus     Status: None   Collection Time: 05/24/24  4:01 AM  Result Value Ref  Range   Phosphorus 3.7 2.5 - 4.6 mg/dL  Comment: Performed at [facility], [address], [place], [place] [address]  Lactate dehydrogenase     Status: Abnormal   Collection Time: 05/24/24  4:01 AM  Result Value Ref Range   LDH 453 (H) 98 - 192 U/L    Comment: Performed at [facility], [address], [place], [place] [address]  Protime-INR     Status: Abnormal   Collection Time: 05/24/24  4:01 AM  Result Value Ref Range   Prothrombin Time 17.0 (H) 11.4 - 15.2 seconds   INR 1.3 (H) 0.8 - 1.2    Comment: (NOTE) INR goal varies based on device and disease states. Performed at [facility], [address], [place], [place] [address]   Fibrinogen     Status: Abnormal   Collection Time: 05/24/24  4:01 AM  Result Value Ref Range   Fibrinogen 548 (H) 210 - 475 mg/dL    Comment: (NOTE) Fibrinogen results may be underestimated in patients receiving thrombolytic therapy. Performed at [facility], [address], [place], [place] [address]   Hepatic function panel     Status: Abnormal   Collection Time: 05/24/24  4:01 AM  Result Value Ref Range   Total Protein 5.3 (L) 6.5 - 8.1 g/dL   Albumin  1.9 (L) 3.5 - 5.0 g/dL   AST 56 (H) 15 - 41 U/L   ALT 50 (H) 0 - 44 U/L   Alkaline Phosphatase 92 38 - 126 U/L   Total Bilirubin 0.7 0.0 - 1.2 mg/dL   Bilirubin, Direct 0.2 0.0 - 0.2 mg/dL   Indirect Bilirubin 0.5 0.3 - 0.9 mg/dL    Comment: Performed at [facility], [address], [place], [place] [address]  APTT     Status: Abnormal   Collection Time: 05/24/24  4:01 AM  Result Value Ref Range   aPTT 45 (H) 24 - 36 seconds    Comment:        IF BASELINE aPTT IS ELEVATED, SUGGEST PATIENT RISK ASSESSMENT BE USED TO DETERMINE APPROPRIATE ANTICOAGULANT THERAPY. Performed at [facility], [address], [place], [place] [address]   Glucose, capillary     Status: Abnormal   Collection Time: 05/24/24  4:06 AM  Result Value Ref Range    Glucose-Capillary 117 (H) 70 - 99 mg/dL    Comment: Glucose reference range applies only to samples taken after fasting for at least 8 hours.  I-STAT 7, (LYTES, BLD GAS, ICA, H+H)     Status: Abnormal   Collection Time: 05/24/24  4:08 AM  Result Value Ref Range   pH, Arterial 7.438 7.35 - 7.45   pCO2 arterial 46.3 32 - 48 mmHg   pO2, Arterial 61 (L) 83 - 108 mmHg   Bicarbonate 31.3 (H) 20.0 - 28.0 mmol/L   TCO2 33 (H) 22 - 32 mmol/L   O2 Saturation 92 %   Acid-Base Excess 6.0 (H) 0.0 - 2.0 mmol/L   Sodium 147 (H) 135 - 145 mmol/L   Potassium 5.0 3.5 - 5.1 mmol/L   Calcium , Ion 1.17 1.15 - 1.40 mmol/L   HCT 25.0 (L) 39.0 - 52.0 %   Hemoglobin 8.5 (L) 13.0 - 17.0 g/dL   Patient temperature 62.8 C    Sample type ARTERIAL   CG4 I-STAT (Lactic acid)     Status: None   Collection Time: 05/24/24  4:09 AM  Result Value Ref Range   Lactic Acid, Venous 1.1 0.5 -  1.9 mmol/L  Glucose, capillary     Status: Abnormal   Collection Time: 05/24/24  8:04 AM  Result Value Ref Range   Glucose-Capillary 123 (H) 70 - 99 mg/dL    Comment: Glucose reference range applies only to samples taken after fasting for at least 8 hours.    DG CHEST PORT 1 VIEW Result Date: 05/23/2024 CLINICAL DATA:  Intubation. EXAM: PORTABLE CHEST 1 VIEW COMPARISON:  05/23/2024 at 5:17 a.m. FINDINGS: Enteric tube courses into the region of the stomach and off the image as tip is not visualized. Endotracheal tube has tip approximately 4.8 cm above the carina. Right-sided chest tube unchanged. Left IJ central venous catheter with tip over the SVC unchanged. Surgical suture over the right perihilar region and mid lung. Moderate right-sided pneumothorax possibly slightly worse. Stable hazy opacification throughout the left lung. Left basilar opacification likely effusion with atelectasis. Stable right base opacification. Stable borderline cardiomegaly. Remainder of the exam is unchanged. IMPRESSION: 1. Moderate right-sided pneumothorax  possibly slightly worse. Right-sided chest tube unchanged. 2. Stable hazy opacification throughout the left lung. Stable left base/retrocardiac opacification and stable right base opacification. 3. Tubes and lines as described. Electronically Signed   By: Toribio Agreste M.D.   On: 05/23/2024 12:02   DG Chest Port 1 View Result Date: 05/23/2024 EXAM: 1 VIEW(S) XRAY OF THE CHEST 05/23/2024 05:15:00 AM COMPARISON: Portable chest yesterday at 5:18 pm. CLINICAL HISTORY: ARDS (adult respiratory distress syndrome) (HCC) 33510. Reason for exam: ARDS. FINDINGS: LINES, TUBES AND DEVICES: Right apical chest tube positioning, ECMO catheter extending both above and below the plane of imaging, both unchanged. ETT tip is 5.6 cm from the carina. Feeding tube extends into the stomach with the intragastric course out of view. A left IJ central line has a tip near the superior cavoatrial junction. LUNGS AND PLEURA: Diffuse bilateral airspace disease continues to be seen, denser on the right due to the partial lung collapse. No new airspace disease is seen. There is a right pneumothorax of moderate size with 9 cm pleuroparenchymal separation previously 8 cm. There are small pleural effusions which appear similar. HEART AND MEDIASTINUM: Stable cardiomegaly. Stable mediastinum. No substantial mediastinal shift is seen. BONES AND SOFT TISSUES: No new osseous findings. IMPRESSION: 1. Moderate right pneumothorax with slight interval increase to 9 cm pleuroparenchymal separation, without substantial mediastinal shift. 2. Diffuse bilateral airspace disease, greater on the right due to partial lung collapse, without new airspace disease. Electronically signed by: Francis Quam MD 05/23/2024 07:09 AM EDT RP Workstation: [id number]   DG Chest Port 1 View Result Date: 05/22/2024 EXAM: 1 VIEW XRAY OF THE CHEST 05/22/2024 01:50:00 PM COMPARISON: Comparison is made to a study from 05/22/2024. CLINICAL HISTORY: ARDS (adult respiratory distress  syndrome); PATIENT LAYING PRONE. FINDINGS: LINES, TUBES AND DEVICES: Stable right-sided chest tube. Feeding tube in her stomach. Endotracheal tube unchanged. LUNGS AND PLEURA: Stable bilateral lung opacities. Grossly stable right apical pneumothorax. No pulmonary edema. No pleural effusion. HEART AND MEDIASTINUM: No acute abnormality of the cardiac and mediastinal silhouettes. BONES AND SOFT TISSUES: No acute osseous abnormality. IMPRESSION: 1. Stable bilateral lung opacities, consistent with ARDS. 2. Stable right apical pneumothorax. 3. Stable right-sided chest tube. 4. Enteric tube appropriately positioned in the stomach. 5. Endotracheal tube unchanged in position. Electronically signed by: Lynwood Seip MD 05/22/2024 05:35 PM EDT RP Workstation: [id number]   DG Chest Port 1 View Result Date: 05/22/2024 CLINICAL DATA:  ECMO. EXAM: PORTABLE CHEST 1 VIEW COMPARISON:  Earlier radiograph dated 05/22/2024. FINDINGS:  Right-sided chest tube in similar position. No significant interval change in the size of right pneumothorax. Interval removal of the enteric tube and placement of a feeding tube. Additional support apparatus in similar position. No significant interval change in bilateral pulmonary opacities. Stable cardiomegaly. No acute osseous pathology. IMPRESSION: No significant interval change in the size of right pneumothorax or bilateral pulmonary opacities. Electronically Signed   By: Vanetta Chou M.D.   On: 05/22/2024 17:30   CARDIAC CATHETERIZATION Result Date: 05/22/2024 Successful VV ECMO placement.   DG Abd Portable 1V Result Date: 05/22/2024 EXAM: 1 VIEW XRAY OF THE ABDOMEN 05/22/2024 10:07:17 AM COMPARISON: 05/20/2024 CLINICAL HISTORY: feeding tube placement ,? Post pyloric; rover FINDINGS: LINES, TUBES AND DEVICES: Feeding tube in place with tip terminating over the distal stomach. BOWEL: Nonobstructive bowel gas pattern. SOFT TISSUES: No opaque urinary calculi. BONES: Lumbosacral spine  fixation hardware and intervertebral disc spacers noted. IMPRESSION: 1. Feeding tube in place with the tip terminating over the distal stomach. Electronically signed by: Donnice Mania MD 05/22/2024 11:02 AM EDT RP Workstation: [id number]    MND:Wzhjupcz except as listed in admit H&P  Blood pressure (!) 133/48, pulse 63, temperature 98.8 F (37.1 C), resp. rate (!) 63, height 6' 3 (1.905 m), weight 107.8 kg, SpO2 94%.  PHYSICAL EXAM: Overall appearance: Sedated, intubated orally and on ECMO Head:  Normocephalic, atraumatic. Ears: External ears are healthy. Nose: External nose is healthy in appearance. Internal nasal exam free of any lesions or obstruction. Oral Cavity/Pharynx: Limited exam, endotracheal tube in place. Larynx/Hypopharynx: Deferred Neuro: Under deep sedation. Neck: No palpable neck masses.  Studies Reviewed: none  Procedures: none   Assessment/Plan: Agree with need for tracheostomy.  Have scheduled for Monday afternoon.  If on anticoagulation, please stop for surgery on Monday.  Medical Decision Making: #/Complex Problems: 2  Data Reviewed:1  Management:2 (1-Straightforward, 2-Low, 3-Moderate, 4-High)   Ida Loader 05/24/2024, 8:54 AM

## 2024-05-24 NOTE — Progress Notes (Signed)
 PHARMACY - ANTICOAGULATION  Pharmacy Consult for  bivalirudin  Indication: VTE, VV ECMO (10/29 - current) Brief A/P: aPTT subtherapeutic Increase bivalirudin  rate  Allergies  Allergen Reactions   Asenapine Other (See Comments) and Nausea And Vomiting    Increased tremors   Dextromethorphan Hbr    Guaifenesin     Latuda [Lurasidone Hcl] Other (See Comments)    Tremors     Lurasidone Other (See Comments) and Hives    Other Reaction(s): Angioedema   Phenylephrine      Patient Measurements: Height: 6' 3 (190.5 cm) Weight: 107.8 kg (237 lb 10.5 oz) IBW/kg (Calculated) : 84.5  Vital Signs: Temp: 98.8 F (37.1 C) (10/31 0425) Temp Source: Bladder (10/31 0400) BP: 103/68 (10/31 0425) Pulse Rate: 62 (10/31 0015)  Labs: Recent Labs    05/22/24 1710 05/22/24 1845 05/22/24 1904 05/23/24 0300 05/23/24 0445 05/23/24 0453 05/23/24 1717 05/23/24 1724 05/23/24 1852 05/23/24 2015 05/23/24 2355 05/24/24 0401 05/24/24 0408  HGB 9.4*  --    < >  --  8.9*   < > 8.9*   < >  --  8.2*  --  9.1* 8.5*  HCT 30.8*  --    < >  --  28.0*   < > 28.4*   < >  --  24.0*  --  29.2* 25.0*  PLT 201  --    < >  --  263  --  223  --   --   --   --  248  --   APTT >200*  --    < > 58*  --    < >  --   --  36  --  39* 45*  --   LABPROT 16.8*  --   --   --  15.3*  --   --   --   --   --   --  17.0*  --   INR 1.3*  --   --   --  1.1  --   --   --   --   --   --  1.3*  --   HEPARINUNFRC >1.10* >1.10*  --  0.33  --   --   --   --   --   --   --   --   --   CREATININE 1.11  --   --   --  0.88  --  1.00  --   --   --   --  0.84  --    < > = values in this interval not displayed.    Estimated Creatinine Clearance: 119.4 mL/min (by C-G formula based on SCr of 0.84 mg/dL).   Assessment: 63 yo male admitted for RUL lobectomy with DVT,  now on VV ECMO, for bivalirudin   Goal of Therapy:  aPTT 50-80s Monitor platelets by anticoagulation protocol: Yes   Plan:  Increase bivalirudin  to 0.075 mg/kg/hr Check  aPTT in 4 hours   Cathlyn Arrant, PharmD, BCPS

## 2024-05-24 NOTE — Progress Notes (Signed)
 Nutrition Follow-up  DOCUMENTATION CODES:   Non-severe (moderate) malnutrition in context of chronic illness  INTERVENTION:   Recommend continuing to titrate TF until goal rate  Tube Feeding via Cortrak:  Goal: Vital 1.5 at 60 ml/hr Continue to titrate by 10 mL q 8 hours until goal rate of 60 ml/hr Pro-Source TF20 60 mL QID Goal TF regimen provides 177 g of protein, 2480kcals, 1100 mL of free water   Recommend addition of scheduled free water  flushes. Sodium elevated, BUN elevated with normal Creatinine, good diuresis on lasix, CVP 3-4 this AM; concern for free water  deficit.  Recommend starting with 150 mL q 6 hours (additional 600 mL in 24 hours), may need additional free water  on follow-up  Add Banatrol TF 60 mL BID via tube as stool bulking agent- each packet provides 5g soluble fiber   NUTRITION DIAGNOSIS:   Moderate Malnutrition related to chronic illness as evidenced by moderate muscle depletion, moderate fat depletion.  Continues but being addressed via TF   GOAL:   Patient will meet greater than or equal to 90% of their needs  Progressing  MONITOR:   Vent status, Labs, Weight trends, TF tolerance  REASON FOR ASSESSMENT:   Consult, Ventilator Enteral/tube feeding initiation and management  ASSESSMENT:   63 yo male admitted for surgery with known RUL lung nodule with plan for lobectomy on 10/16. Pt with respiratory decline requiring transfer to ICU 10/21 and intubated on 10/26.  PMH includes COPD, Stroke, MI, GERD, HTN, Hep C, PUD, diverticulitis, HF, hx of polysubstance abuse (Cocaine, marijuana, EtOH), anxiety/depression, Bipolar 1 disorder, schizophrenia   10/16 OR: Robotic assisted R thoracoscopy, RUL wedge resection, R upper lobectomy, lymph node dissection 10/26 Worsening respiratory status requiring intubation, iNO started 10/27 trickle TF started 10/28 TF titrating up 10/29 Cortrak placed; cannulated for VV ECMO 10/30 TF held this morning;  re-initiated in afternoon - no s/sx aspiration 10/31 TF remains at trickle til BM, finally had BM after relistor/sorbitol  Pt remains on vent support, VV ECMO. Noted ENT has been consulted for trach placement, tentatively planned for Monday  Remains on nimbex gtt. Noted strategy for sedation being adjusted today  Vital 1.5 infusing at 20 ml/hr via Cortrak, not increased due to lack of BM per RN  Pt remained constipated this AM, discussed during ECMO rounds and pt given additional dose of sorbitol today plus Relistor x 1 with successful results. +liquid stool, now FMS in place. Plan to add soluble fiber to bulk up stool  Weight down to 107.8 kg; weight 109.3 kg. Admission wt near 116 kg.  UOP 4 L in 24 hours with diuresis Chest tubes with only 30 mL  Sodium elevated, BUN 50 with normal Creatinine. Receiving lasix 40 mg IV daily  Labs: Sodium 147 (H) Potassium 5.1 (wdl) BUN 50 (H) Creatinine 0.84 (wdl) Phosphorus 3.7 (wdl) Magnesium  3.0 (H) Albumin  1.9   Meds: SS novolog   Diet Order:   Diet Order             Diet NPO time specified  Diet effective now                   EDUCATION NEEDS:   Not appropriate for education at this time  Skin:  Skin Assessment: Skin Integrity Issues: Skin Integrity Issues:: Incisions Incisions: chest (surgical)  Last BM:  10/31 +type 7 stool post interventions, FMS placed  Height:   Ht Readings from Last 1 Encounters:  05/19/24 6' 3 (1.905 m)  Weight:   Wt Readings from Last 1 Encounters:  05/24/24 107.8 kg    Ideal Body Weight:  89.1 kg  BMI:  Body mass index is 29.7 kg/m.  Estimated Nutritional Needs:   Kcal:  2200-2500 kcals  Protein:  175-190g  Fluid:  >/= 2L   Betsey Finger MS, RDN, LDN, CNSC Registered Dietitian 3 Clinical Nutrition RD Inpatient Contact Info in Amion

## 2024-05-24 NOTE — Plan of Care (Signed)
 Patient wakes up violently whenever Nimbex is trying to be turned off.  At this stage we will change the strategy for the sedation.  Patient had a bowel movement.  1.Restart Nimbex for now 2. Increase Seroquel  to 200 mg twice daily from 100 in the morning and  200 in the afternoon 3.  Bolus and start ketamine  drip 4.  Start oxycodone  feeding tube with an attempt to come down on IV Dilaudid  5.  Start Klonopin via feeding tube with an attempt to wean down and stop Versed  6.  Reattempt to stop Nimbex later today.  Tamela Stakes, MD  Attending Physician, Critical Care Medicine [facility] See Amion for pager If no response to pager, please call [phone] until 7pm After 7pm, Please call E-link [phone]

## 2024-05-24 NOTE — Progress Notes (Signed)
 [facility]  Date of Admission:  05/09/2024      Total days of antibiotics 7  Meropenem   Micafungin           ASSESSMENT: Keith Gibbs is a 63 y.o. male admitted with:   Fever, leukocytosis -  Fungemic w/ Candida Glabratta -  BCx from 10/26 (+) with candida growing; notified today with auto-alert. Suspect this is the leading contributor of his fever and leukocytosis of late. He was started on micafungin already yesterday for candida in the sputum culture. Likely line infection given multiple lines, broad spectrum antibiotics and prolonged hospital stay; vs translocation from lungs. He is not eligible for line removal d/t hemodynamic instability. Will try to treat through with IV micafungin - needs higher dose with ECMO circuit.  - Continue micafungin 200 mg daily - Would benefit from echocardiogram if we re-isolate fungus.   - repeat blood cultures pending 10/30  - Nothing urgent with regards to optho eval - would defer until trajectory of illness is better declared    ARDS on VV ECMO S/P RUL Lobectomy -  Bronchogenic Ca -  Pseudomonas VAP -  Keith Gibbs was initially admitted to the hospital 10/16 for RU lobectomy. Complicated by worsening ARDS/respiratory failure and now escalated to intubation + VV ECMO with plans to trach him Monday. He has pseudomonas aeruginosa pneumonia (sensitive to all active abx).  Meropenem has been chosen by ECMO team due to the less disruptive PK effect vs cefepime with regards to sequestration in circuit. He is on day 7 of treatment.Repeat BAL on 10/29 not growing anything but yeast at this point. Clinically he has improved since starting antifungal and pressors off with stable temperatures.  - would stop meropenem no later than Monday after trach (day 10) - improved with antifungal tx and not on new BAL.    H/O Hepatitis C -  Chart reviewed indicates he was treated in the past prior to 2021.  Not a priority at  the movement to investigate this but could check RNA.    Substance Abuse -  Cocaine abuse. I don't see any history of documented IV drug use   Dr. Dennise is on over the weekend for ID questions. Dr. Luiz and I will be here Monday to follow again formally.   PLAN: Continue micafungin 200 mg while on ecmo  Would stop meropenem Monday after trach with ENT (will complete day 10).  Lines changed out once stable to do so.  FU pending blood cultures    Principal Problem:   S/P lobectomy of lung Active Problems:   Status post robot-assisted surgical procedure   Acute hypoxic respiratory failure (HCC)   Anxiety disorder due to medical condition   Malnutrition of moderate degree   Acute respiratory distress syndrome (ARDS) due to COVID-19 virus (HCC)   Candidemia (HCC)   Central line infection   Pseudomonas aeruginosa infection   Ventilator associated pneumonia (HCC)   Patient receiving extracorporeal membrane oxygenation (ECMO)    arformoterol  15 mcg Nebulization BID   artificial tears  1 Application Both Eyes Q8H   budesonide  (PULMICORT ) nebulizer solution  0.25 mg Nebulization BID   Chlorhexidine  Gluconate Cloth  6 each Topical Daily   feeding supplement (PROSource TF20)  60 mL Per Tube QID   furosemide  40 mg Intravenous Daily   gabapentin   600 mg Per Tube Daily   And   gabapentin   1,200 mg  Per Tube QHS   insulin aspart  0-15 Units Subcutaneous Q4H   lactulose  10 g Per Tube TID   mouth rinse  15 mL Mouth Rinse Q2H   pantoprazole  (PROTONIX ) IV  40 mg Intravenous QHS   polyethylene glycol  17 g Per Tube Daily   QUEtiapine   100 mg Per Tube Daily   QUEtiapine   200 mg Per Tube QHS   revefenacin  175 mcg Nebulization Daily   senna-docusate  2 tablet Per Tube QHS   sodium chloride  flush  10-40 mL Intracatheter Q12H    SUBJECTIVE: Intubated, non-responsive   Review of Systems: ROS  Allergies  Allergen Reactions   Asenapine Other (See Comments) and Nausea And Vomiting     Increased tremors   Dextromethorphan Hbr    Guaifenesin     Latuda [Lurasidone Hcl] Other (See Comments)    Tremors     Lurasidone Other (See Comments) and Hives    Other Reaction(s): Angioedema   Phenylephrine      OBJECTIVE: Vitals:   05/24/24 1000 05/24/24 1015 05/24/24 1030 05/24/24 1137  BP:    (!) 177/63  Pulse: 75   79  Resp: (!) 46 (!) 70 (!) 74   Temp: 98.6 F (37 C) 98.8 F (37.1 C) 98.8 F (37.1 C)   TempSrc:      SpO2: 93%   93%  Weight:      Height:       Body mass index is 29.7 kg/m.  Physical Exam Constitutional:      Appearance: He is ill-appearing.  Cardiovascular:     Rate and Rhythm: Normal rate and regular rhythm.     Heart sounds: No murmur heard. Pulmonary:     Effort: Pulmonary effort is normal.     Breath sounds: Normal breath sounds.  Abdominal:     General: There is no distension.     Palpations: Abdomen is soft.  Skin:    Capillary Refill: Capillary refill takes less than 2 seconds.  Neurological:     Comments: Heavily sedated      Lab Results Lab Results  Component Value Date   WBC 21.2 (H) 05/24/2024   HGB 9.2 (L) 05/24/2024   HCT 27.0 (L) 05/24/2024   MCV 104.3 (H) 05/24/2024   PLT 248 05/24/2024    Lab Results  Component Value Date   CREATININE 0.84 05/24/2024   BUN 50 (H) 05/24/2024   NA 147 (H) 05/24/2024   K 4.7 05/24/2024   CL 107 05/24/2024   CO2 29 05/24/2024    Lab Results  Component Value Date   ALT 50 (H) 05/24/2024   AST 56 (H) 05/24/2024   ALKPHOS 92 05/24/2024   BILITOT 0.7 05/24/2024     Microbiology: Recent Results (from the past 240 hours)  Culture, Respiratory w Gram Stain     Status: None   Collection Time: 05/19/24 12:23 PM   Specimen: Bronchoalveolar Lavage; Respiratory  Result Value Ref Range Status   Specimen Description BRONCHIAL ALVEOLAR LAVAGE  Final   Special Requests NONE  Final   Gram Stain   Final    NO WBC SEEN ABUNDANT YEAST WITH PSEUDOHYPHAE Performed at [facility], [address], [place], [place] [address]    Culture   Final    RARE PSEUDOMONAS AERUGINOSA MODERATE CANDIDA GLABRATA FEW CANDIDA TROPICALIS    Report Status 05/24/2024 FINAL  Final   Organism ID, Bacteria PSEUDOMONAS AERUGINOSA  Final      Susceptibility  Pseudomonas aeruginosa - MIC*    MEROPENEM 1 SENSITIVE Sensitive     CIPROFLOXACIN  0.25 SENSITIVE Sensitive     IMIPENEM 1 SENSITIVE Sensitive     CEFEPIME 2 SENSITIVE Sensitive     CEFTAZIDIME/AVIBACTAM 2 SENSITIVE Sensitive     CEFTOLOZANE/TAZOBACTAM 1 SENSITIVE Sensitive     TOBRAMYCIN <=1 SENSITIVE Sensitive     CEFTAZIDIME 2 SENSITIVE Sensitive     * RARE PSEUDOMONAS AERUGINOSA  Culture, blood (Routine X 2) w Reflex to ID Panel     Status: None (Preliminary result)   Collection Time: 05/19/24  1:13 PM   Specimen: BLOOD RIGHT ARM  Result Value Ref Range Status   Specimen Description BLOOD RIGHT ARM  Final   Special Requests   Final    BOTTLES DRAWN AEROBIC AND ANAEROBIC Blood Culture adequate volume   Culture  Setup Time   Final    BUDDING YEAST SEEN AEROBIC BOTTLE ONLY CRITICAL RESULT CALLED TO, READ BACK BY AND VERIFIED WITH: PHARMD BLAKE WANNARAT ON 05/23/24 @ 1312 BY DRT    Culture   Final    YEAST CULTURE REINCUBATED FOR BETTER GROWTH Performed at [facility], [address], [place], [place] [address]    Report Status PENDING  Incomplete  Culture, blood (Routine X 2) w Reflex to ID Panel     Status: None   Collection Time: 05/19/24  1:13 PM   Specimen: BLOOD LEFT ARM  Result Value Ref Range Status   Specimen Description BLOOD LEFT ARM  Final   Special Requests   Final    BOTTLES DRAWN AEROBIC AND ANAEROBIC Blood Culture results may not be optimal due to an inadequate volume of blood received in culture bottles   Culture   Final    NO GROWTH 5 DAYS Performed at [facility], [address] [place], [place] [address]    Report Status 05/24/2024 FINAL  Final  Blood Culture ID Panel  (Reflexed)     Status: Abnormal   Collection Time: 05/19/24  1:13 PM  Result Value Ref Range Status   Enterococcus faecalis NOT DETECTED NOT DETECTED Final   Enterococcus Faecium NOT DETECTED NOT DETECTED Final   Listeria monocytogenes NOT DETECTED NOT DETECTED Final   Staphylococcus species NOT DETECTED NOT DETECTED Final   Staphylococcus aureus (BCID) NOT DETECTED NOT DETECTED Final   Staphylococcus epidermidis NOT DETECTED NOT DETECTED Final   Staphylococcus lugdunensis NOT DETECTED NOT DETECTED Final   Streptococcus species NOT DETECTED NOT DETECTED Final   Streptococcus agalactiae NOT DETECTED NOT DETECTED Final   Streptococcus pneumoniae NOT DETECTED NOT DETECTED Final   Streptococcus pyogenes NOT DETECTED NOT DETECTED Final   A.calcoaceticus-baumannii NOT DETECTED NOT DETECTED Final   Bacteroides fragilis NOT DETECTED NOT DETECTED Final   Enterobacterales NOT DETECTED NOT DETECTED Final   Enterobacter cloacae complex NOT DETECTED NOT DETECTED Final   Escherichia coli NOT DETECTED NOT DETECTED Final   Klebsiella aerogenes NOT DETECTED NOT DETECTED Final   Klebsiella oxytoca NOT DETECTED NOT DETECTED Final   Klebsiella pneumoniae NOT DETECTED NOT DETECTED Final   Proteus species NOT DETECTED NOT DETECTED Final   Salmonella species NOT DETECTED NOT DETECTED Final   Serratia marcescens NOT DETECTED NOT DETECTED Final   Haemophilus influenzae NOT DETECTED NOT DETECTED Final   Neisseria meningitidis NOT DETECTED NOT DETECTED Final   Pseudomonas aeruginosa NOT DETECTED NOT DETECTED Final   Stenotrophomonas maltophilia NOT DETECTED NOT DETECTED Final   Candida albicans NOT DETECTED NOT DETECTED Final  Candida auris NOT DETECTED NOT DETECTED Final   Candida glabrata DETECTED (A) NOT DETECTED Final    Comment: CRITICAL RESULT CALLED TO, READ BACK BY AND VERIFIED WITH: PHARMD BLAKE WANNARAT ON 05/23/24 @ 1312 BY DRT    Candida krusei NOT DETECTED NOT DETECTED Final   Candida  parapsilosis NOT DETECTED NOT DETECTED Final   Candida tropicalis NOT DETECTED NOT DETECTED Final   Cryptococcus neoformans/gattii NOT DETECTED NOT DETECTED Final    Comment: Performed at [facility], [address], [place], [place] [address]  Culture, Respiratory w Gram Stain     Status: None (Preliminary result)   Collection Time: 05/22/24  6:45 PM   Specimen: Bronchoalveolar Lavage; Respiratory  Result Value Ref Range Status   Specimen Description BRONCHIAL ALVEOLAR LAVAGE  Final   Special Requests NONE  Final   Gram Stain   Final    FEW WBC PRESENT, PREDOMINANTLY PMN FEW BUDDING YEAST SEEN    Culture   Final    ABUNDANT YEAST IDENTIFICATION TO FOLLOW Performed at [facility], [address], [place], [place] [address]    Report Status PENDING  Incomplete  Pneumocystis smear by DFA     Status: None   Collection Time: 05/22/24  6:45 PM   Specimen: Bronchoalveolar Lavage; Respiratory  Result Value Ref Range Status   Specimen Source-PJSRC BRONCHIAL ALVEOLAR LAVAGE  Final   Pneumocystis jiroveci Ag See Scanned report in [place] Link  Final    Comment: Performed at [facility], [address], [place], [place] [address]  Culture, blood (Routine X 2) w Reflex to ID Panel     Status: None (Preliminary result)   Collection Time: 05/23/24 10:02 PM   Specimen: BLOOD RIGHT HAND  Result Value Ref Range Status   Specimen Description BLOOD RIGHT HAND  Final   Special Requests   Final    BOTTLES DRAWN AEROBIC AND ANAEROBIC Blood Culture results may not be optimal due to an inadequate volume of blood received in culture bottles   Culture   Final    NO GROWTH < 12 HOURS Performed at [facility], [address], [place], [place] [address]    Report Status PENDING  Incomplete  Culture, blood (Routine X 2) w Reflex to ID Panel     Status: None (Preliminary result)   Collection Time: 05/23/24 10:04 PM   Specimen: BLOOD RIGHT HAND  Result Value Ref Range Status    Specimen Description BLOOD RIGHT HAND  Final   Special Requests   Final    BOTTLES DRAWN AEROBIC ONLY Blood Culture results may not be optimal due to an inadequate volume of blood received in culture bottles   Culture   Final    NO GROWTH < 12 HOURS Performed at [facility], [address] [place], [place] [address]    Report Status PENDING  Incomplete  Culture, blood (Routine X 2) w Reflex to ID Panel     Status: None (Preliminary result)   Collection Time: 05/24/24  6:55 AM   Specimen: BLOOD LEFT ARM  Result Value Ref Range Status   Specimen Description BLOOD LEFT ARM  Final   Special Requests   Final    BOTTLES DRAWN AEROBIC ONLY Blood Culture results may not be optimal due to an inadequate volume of blood received in culture bottles   Culture   Final    NO GROWTH < 12 HOURS Performed at [facility],  [address] [place], [place] [address]    Report Status PENDING  Incomplete  Culture, blood (Routine X 2) w Reflex to ID Panel     Status: None (Preliminary result)   Collection Time: 05/24/24  6:55 AM   Specimen: BLOOD LEFT ARM  Result Value Ref Range Status   Specimen Description BLOOD LEFT ARM  Final   Special Requests   Final    BOTTLES DRAWN AEROBIC ONLY Blood Culture results may not be optimal due to an inadequate volume of blood received in culture bottles   Culture   Final    NO GROWTH < 12 HOURS Performed at [facility], [address], [place], [place] [address]    Report Status PENDING  Incomplete    Corean Fireman, MSN, NP-C [facility] Pager: [phone]  @TODAY @ 12:01 PM   Total Encounter Time: 55m

## 2024-05-24 NOTE — Progress Notes (Signed)
 NAME:  Keith Gibbs, MRN:  [id number], DOB:  [birth year], LOS: 15 ADMISSION DATE:  05/09/2024, CONSULTATION DATE: 05/14/2024 REFERRING MD: CTS, CHIEF COMPLAINT: Acute on chronic hypoxic respiratory failure  History of Present Illness:  63 yo male presented for lobectomy with CTS. Progressing as expected post operatively with CT in place and was on 4E when this morning he decompensated with worsening hypoxemia and increased wob. CTS transferred to ICU and requested CCM evaluation.     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  He was difficult to oxygenate and ventilate therefore APRV started.  Patient seems to be tolerating the APRV. 10/29 -nitric oxide is weaned off overnight.  It has not helped.  It did not change his respiratory status.  Patient continues to have ARDS with bilateral pulmonary infiltrates.  At this stage we decided to proning.  Chest tube is hooked up to suction. 10/29 -patient's oxygenation improved with proning but ventilation got worse.  At this stage given the ARDS a collective decision is made to cannulate the patient for VV ECMO.  Family was in agreement. 10/30 -blood cultures from 05/19/2024 and BAL cultures from 05/19/2024 grew Candida glabrata  Interim History / Subjective:  Patient remains sedated and paralyzed overnight.  He did not have a bowel movement in few days however he had 1 today after sorbitol.  On bivalirudin .  Had good diuresis and CVP is low.  Steroids are off since yesterday.  BAL from 1030 is growing Candida.    Objective    Blood pressure 115/70, pulse 72,  temperature 98.6 F (37 C), resp. rate (!) 51, height 6' 3 (1.905 m), weight 107.8 kg, SpO2 96%. CVP:  [3 mmHg-48 mmHg] 8 mmHg  Vent Mode: PCV FiO2 (%):  [60 %] 60 % Set Rate:  [15 bmp] 15 bmp PEEP:  [10 cmH20] 10 cmH20 Plateau Pressure:  [22 cmH20-23 cmH20] 22 cmH20   Intake/Output Summary (Last 24 hours) at 05/24/2024 1345 Last data filed at 05/24/2024 1300 Gross per 24 hour  Intake 2040.99 ml  Output 3290 ml  Net -1249.01 ml   Filed Weights   05/22/24 0500 05/23/24 0500 05/24/24 0400  Weight: 109.5 kg 109.3 kg 107.8 kg    Examination: General: Critically ill appearing man intubated, right IJ Avalon catheter.  Deeply sedated and paralyzed today. HEENT: Both pupils equal sluggish reacting to light there is right internal jugular dual-lumen ECMO catheter.  Lungs: Diminished breath sounds right side.  Bibasal crackles.  Prolonged expiratory phase Cardiovascular: S1-S2 heard no S3 no murmur Abdomen: Soft nontender nondistended Extremities: No trauma mild pedal edema.  No deformities. 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  No high fevers today.  Current temperature is 99.7.  Several modes of ventilation including nitric oxide did not help.  Pulmonary did not help.  Eventually patient went on VV ECMO for ARDS management.  Acute hypoxic hypercapnic respiratory failure ARDS Status post right upper lobe pneumonectomy History of COPD with acute exacerbation FEV1/ FVC on 9/25 is 74.  PFTs read as mild obstructive lung disease. Acute pulmonary edema Possible aspiration Adenocarcinoma of the lung status post right upper lobe lobectomy Right pneumothorax was pneumonectomy status post chest  tube placement  -Patient is cannulated for VV ECMO on 10/29 for ARDS as we failed to oxygenate and ventilate and several modes of ventilation. ECMO: Flow is 4.07 L/min Delta P is 27 eFiO2 88% Sweep is 8 L/min On bivalirudin  for anticoagulation  Ventilator: Pressure control ventilation with 60% FiO2 PEEP is 10. Inspiratory pressure is 14.  peak pressure is 24.    Sedation with Versed  and Dilaudid  Will try to wean down and stop Nimbex. RASS goal -2 for today  - Nitric oxide is weaned down on 05/22/2024 - Continue Pulmicort  - Completed Decadron  therapy on 05/23/2024. - DuoNebs every 4 hours - Monitor chest tube output.  Currently there is no leak and is tidaling. -Stop IV Lasix. - Tube feeds reach goal. - ENT considering for possible tracheostomy on Monday if the VV ECMO prolongs.   Septic shock Fungal pneumonia -Candida glabrata from BAL on 05/19/2024 Fungemia -Candida glabrata on blood cultures from 05/19/2024 Pneumonia- Pseudomonas growing from BAL 05/19/2024 - Continue  meropenem and vancomycin  stopped 80 - On IV micafungin -Appreciate ID input. - Patient is off vasopressor therapy.  Acute metabolic encephalopathy ICU delirium, in the setting of patient with a history of anxiety, and schizoaffective disorder bipolar type  - Currently on Versed  and Dilaudid . -Stop Nimbex. - On Neurontin  and Seroquel . - Stop celocoxib and hold cymbalta  as we cannot crush the medication for tube - Sodium valproate will be held.  Acute Kidney Injury - likely pre renal from septic shock this is resolved. - Monitor renal functions and urine output. - Monitor electrolytes and replace accordingly.  DVT Lower extremity - Heparin  changed to bivalirudin  per ECMO protocol - Doppler shows lower extremity DVT in gastronemius vein   Increased NG output - Abdominal x-ray did not show any ileus. - Tube feeds are ongoing.    Anemia - Hemoglobin stable at 10.4.   Full Code On heparin   gtt. On Protonix    Spoke with the mother and updated.    Labs   CBC: Recent Labs  Lab 05/20/24 0500 05/20/24 1019 05/21/24 0421 05/21/24 1424 05/22/24 0451 05/22/24 0746 05/22/24 1710 05/22/24 1904 05/22/24 2243 05/23/24 0445 05/23/24 0453 05/23/24 1717 05/23/24 1724 05/23/24 2015 05/24/24 0401 05/24/24 0408 05/24/24 0948 05/24/24 1222  WBC 16.2*  --  16.2*  --  19.7*  --  22.9*  --  23.7* 26.7*  --  18.9*  --   --  21.2*  --   --   --   NEUTROABS 13.3*  --  11.7*  --  13.5*  --   --   --   --  18.6*  --   --   --   --  15.5*  --   --   --   HGB 11.1*   < > 10.3*   < > 10.4*   < > 9.4*   < > 9.4* 8.9*   < > 8.9*   < > 8.2* 9.1* 8.5* 9.2* 9.2*  HCT 32.8*   < > 30.2*   < > 32.0*   < > 30.8*   < > 29.5* 28.0*   < > 28.4*   < > 24.0* 29.2* 25.0*  27.0* 27.0*  MCV 96.2  --  96.5  --  100.6*  --  105.5*  --  102.4* 102.2*  --  103.3*  --   --  104.3*  --   --   --   PLT 148*  --  177  --  215  --  201  --  243 263  --  223  --   --  248  --   --   --    < > = values in this interval not displayed.    Basic Metabolic Panel: Recent Labs  Lab 05/20/24 0500 05/20/24 1019 05/21/24 0421 05/21/24 1424 05/22/24 0451 05/22/24 0746 05/22/24 1710 05/22/24 1904 05/23/24 0445 05/23/24 0453 05/23/24 1717 05/23/24 1724 05/23/24 2015 05/24/24 0401 05/24/24 0408 05/24/24 0948 05/24/24 1222  NA 135   < > 139   < > 143   < > 143   < > 146*   < > 145   < > 145 147* 147* 147* 148*  K 4.6   < > 4.7   < > 4.8   < > 5.3*   < > 5.3*   < > 5.0   < > 4.8 5.1 5.0 4.7 4.7  CL 94*  --  100  --  100  --  104  --  106  --  106  --   --  107  --   --   --   CO2 25  --  29  --  29  --  29  --  29  --  29  --   --  29  --   --   --   GLUCOSE 185*  --  161*  --  134*  --  200*  --  113*  --  160*  --   --  109*  --   --   --   BUN 47*  --  48*  --  51*  --  51*  --  46*  --  50*  --   --  50*  --   --   --   CREATININE 1.85*  --  1.12  --  0.98  --  1.11  --  0.88  --  1.00  --   --  0.84  --    --   --   CALCIUM  7.6*  --  7.6*  --  7.9*  --  7.5*  --  7.5*  --  7.8*  --   --  8.2*  --   --   --   MG 2.6*  --  3.3*  --  3.1*  --   --   --  3.1*  --   --   --   --  3.0*  --   --   --   PHOS 7.1*  --  3.2  --  3.2  --   --   --  3.1  --   --   --   --  3.7  --   --   --    < > = values in this interval not displayed.   GFR: Estimated Creatinine Clearance: 119.4 mL/min (by C-G formula based on SCr of 0.84 mg/dL). Recent Labs  Lab 05/17/24 1535 05/18/24 0244 05/22/24 1730 05/22/24 2243 05/23/24 0445 05/23/24 0458 05/23/24 1717 05/23/24 1734 05/24/24 0401 05/24/24 0409  PROCALCITON 0.59  --   --   --   --   --   --   --   --   --  WBC  --    < >  --  23.7* 26.7*  --  18.9*  --  21.2*  --   LATICACIDVEN  --    < > 1.6  --   --  1.1  --  1.3  --  1.1   < > = values in this interval not displayed.    Liver Function Tests: Recent Labs  Lab 05/22/24 0451 05/22/24 1710 05/23/24 0445 05/24/24 0401  AST 42* 54* 31 56*  ALT 25 28 23  50*  ALKPHOS 92 79 79 92  BILITOT 0.4 0.6 0.5 0.7  PROT 5.9* 5.1* 5.2* 5.3*  ALBUMIN  1.9* 1.6* 1.8* 1.9*    No results for input(s): LIPASE, AMYLASE in the last 168 hours. No results for input(s): AMMONIA in the last 168 hours.  ABG    Component Value Date/Time   PHART 7.411 05/24/2024 1222   PCO2ART 48.1 (H) 05/24/2024 1222   PO2ART 65 (L) 05/24/2024 1222   HCO3 30.5 (H) 05/24/2024 1222   TCO2 32 05/24/2024 1222   ACIDBASEDEF 1.0 05/19/2024 1154   O2SAT 92 05/24/2024 1222     Coagulation Profile: Recent Labs  Lab 05/22/24 1710 05/23/24 0445 05/24/24 0401  INR 1.3* 1.1 1.3*    Cardiac Enzymes: No results for input(s): CKTOTAL, CKMB, CKMBINDEX, TROPONINI in the last 168 hours.  HbA1C: Hgb A1c MFr Bld  Date/Time Value Ref Range Status  02/12/2024 08:40 AM 5.3 4.8 - 5.6 % Final    Comment:             Prediabetes: 5.7 - 6.4          Diabetes: >6.4          Glycemic control for adults with diabetes: <7.0    04/26/2022 03:30 PM 5.2 4.8 - 5.6 % Final    Comment:    (NOTE) Pre diabetes:          5.7%-6.4%  Diabetes:              >6.4%  Glycemic control for   <7.0% adults with diabetes     CBG: Recent Labs  Lab 05/23/24 2014 05/23/24 2347 05/24/24 0406 05/24/24 0804 05/24/24 1221  GLUCAP 145* 92 117* 123* 142*    Review of Systems:   Negative except above  Past Medical History:  He,  has a past medical history of Anginal pain, Anxiety, Arthritis, Bipolar 1 disorder (HCC), Bursitis, CAD (coronary artery disease), Cancer (HCC), Chronic pain, COPD (chronic obstructive pulmonary disease) (HCC), Current use of long term anticoagulation, Depression, Diverticulitis, Dyspnea, GERD (gastroesophageal reflux disease), Grade I diastolic dysfunction, Hepatitis C (2012), HLD (hyperlipidemia), Hypertension, MI (myocardial infarction) (HCC), Polysubstance abuse (HCC), PUD (peptic ulcer disease), S/P angioplasty with stent (06/10/2016), S/P PTCA (percutaneous transluminal coronary angioplasty) (12/04/2019), Schizophrenia (HCC), Stroke (HCC), Tremors, and Valvular insufficiency.   Surgical History:   Past Surgical History:  Procedure Laterality Date   ABDOMINAL SURGERY     removed small piece of intestines due to [facility] Diverticulosis   ANTERIOR LAT LUMBAR FUSION N/A 08/23/2022   Procedure: DIRECT LATERAL INTERBODY FUSION  LUMBAR TWO- LUMBAR THREE, LUMBAR THREE-LUMBAR FOUR , EXPLORE AND EXTEND FUSION LUMBAR TWO-LUMBAR FIVE, POSTERIOR DECOMPRESSION LUMBAR THREE-LUMBAR FOUR, LEFT LUMBAR TWO-LUMBAR THREE;  Surgeon: Debby Dorn MATSU, MD;  Location: [facility] OR;  Service: Neurosurgery;  Laterality: N/A;   APPENDECTOMY     BACK SURGERY     CARDIAC CATHETERIZATION Left 06/10/2016   Procedure: Left Heart Cath and Coronary Angiography;  Surgeon: Denyse A  Fernand, MD;  Location: [facility] INVASIVE CV LAB;  Service: Cardiovascular;  Laterality: Left;   CARDIAC CATHETERIZATION N/A 06/10/2016   Procedure: Coronary Stent  Intervention;  Surgeon: Cara JONETTA Lovelace, MD;  Location: [facility] INVASIVE CV LAB;  Service: Cardiovascular;  Laterality: N/A;   CHOLECYSTECTOMY N/A 02/17/2022   Procedure: LAPAROSCOPIC CHOLECYSTECTOMY;  Surgeon: Stevie Herlene Righter, MD;  Location: [facility] OR;  Service: General;  Laterality: N/A;   COLON SURGERY     COLONOSCOPY     COLONOSCOPY WITH PROPOFOL  N/A 01/05/2017   Procedure: COLONOSCOPY WITH PROPOFOL ;  Surgeon: Therisa Bi, MD;  Location: [facility] ENDOSCOPY;  Service: Endoscopy;  Laterality: N/A;   COLONOSCOPY WITH PROPOFOL  N/A 02/13/2020   Procedure: COLONOSCOPY WITH PROPOFOL ;  Surgeon: Janalyn Keene NOVAK, MD;  Location: [facility] ENDOSCOPY;  Service: Endoscopy;  Laterality: N/A;   CORONARY ANGIOPLASTY WITH STENT PLACEMENT     CORONARY PRESSURE/FFR STUDY N/A 12/04/2019   Procedure: INTRAVASCULAR PRESSURE WIRE/FFR STUDY;  Surgeon: Wonda Sharper, MD;  Location: [facility] INVASIVE CV LAB;  Service: Cardiovascular;  Laterality: N/A;   ECMO CANNULATION N/A 05/22/2024   Procedure: ECMO CANNULATION;  Surgeon: Cherrie Toribio SAUNDERS, MD;  Location: [facility] INVASIVE CV LAB;  Service: Cardiovascular;  Laterality: N/A;   ESOPHAGOGASTRODUODENOSCOPY (EGD) WITH PROPOFOL  N/A 01/05/2017   Procedure: ESOPHAGOGASTRODUODENOSCOPY (EGD) WITH PROPOFOL ;  Surgeon: Therisa Bi, MD;  Location: [facility] ENDOSCOPY;  Service: Endoscopy;  Laterality: N/A;   ESOPHAGOGASTRODUODENOSCOPY (EGD) WITH PROPOFOL  N/A 02/13/2020   Procedure: ESOPHAGOGASTRODUODENOSCOPY (EGD) WITH PROPOFOL ;  Surgeon: Janalyn Keene NOVAK, MD;  Location: [facility] ENDOSCOPY;  Service: Endoscopy;  Laterality: N/A;   INTERCOSTAL NERVE BLOCK  05/09/2024   Procedure: BLOCK, NERVE, INTERCOSTAL;  Surgeon: Kerrin Elspeth BROCKS, MD;  Location: [facility] OR;  Service: Thoracic;;   KNEE ARTHROSCOPY WITH MEDIAL MENISECTOMY Right 09/05/2017   Procedure: KNEE ARTHROSCOPY WITH MEDIAL AND LATERAL  MENISECTOMY PARTIAL SYNOVECTOMY;  Surgeon: Kathlynn Sharper, MD;  Location: [facility] ORS;  Service:  Orthopedics;  Laterality: Right;   LEFT HEART CATH AND CORONARY ANGIOGRAPHY N/A 12/04/2019   Procedure: LEFT HEART CATH AND CORONARY ANGIOGRAPHY;  Surgeon: Wonda Sharper, MD;  Location: [facility] INVASIVE CV LAB;  Service: Cardiovascular;  Laterality: N/A;   LEFT HEART CATH AND CORONARY ANGIOGRAPHY Left 11/25/2022   Procedure: LEFT HEART CATH AND CORONARY ANGIOGRAPHY;  Surgeon: Fernand Denyse LABOR, MD;  Location: [facility] INVASIVE CV LAB;  Service: Cardiovascular;  Laterality: Left;   LOBECTOMY, LUNG, ROBOT-ASSISTED, USING VATS Right 05/09/2024   Procedure: LOBECTOMY, LUNG RIGHT UPPER LOBE, ROBOT-ASSISTED, USING VATS;  Surgeon: Kerrin Elspeth BROCKS, MD;  Location: [facility] OR;  Service: Thoracic;  Laterality: Right;   LYMPH NODE BIOPSY  05/09/2024   Procedure: LYMPH NODE BIOPSY;  Surgeon: Kerrin Elspeth BROCKS, MD;  Location: [facility] OR;  Service: Thoracic;;   REVERSE SHOULDER ARTHROPLASTY Right 08/31/2023   Procedure: RIGHT REVERSE SHOULDER ARTHROPLASTY;  Surgeon: [name], MD;  Location: [facility] OR;  Service: Orthopedics;  Laterality: Right;   SHOULDER SURGERY Right 04/09/2012   SPINE SURGERY     TOTAL KNEE ARTHROPLASTY Right 01/19/2021   Procedure: TOTAL KNEE ARTHROPLASTY - [place] Amber to Assist;  Surgeon: Kathlynn Sharper, MD;  Location: [facility] ORS;  Service: Orthopedics;  Laterality: Right;   WEDGE RESECTION, LUNG, ROBOT-ASSISTED, THORACOSCOPIC Right 05/09/2024   Procedure: WEDGE RESECTION, LUNG RIGHT UPPER LOBE, ROBOT-ASSISTED, THORACOSCOPIC;  Surgeon: Kerrin Elspeth BROCKS, MD;  Location: [facility] OR;  Service: Thoracic;  Laterality: Right;     Social History:   reports that he quit smoking about 25 years ago. His smoking use included  cigarettes. He started smoking about 45 years ago. He has a 63.9 pack-year smoking history. He has been exposed to tobacco smoke. He has never used smokeless tobacco. He reports that he does not currently use alcohol  after a past usage of about 3.0 standard drinks of alcohol  per week. He  reports that he does not currently use drugs after having used the following drugs: Cocaine and Marijuana.   Family History:  His family history includes Depression in his mother; Early death in his father; Heart attack in his father; Heart disease in his father and mother; Hypertension in his father, mother, and sister; Osteoarthritis in his mother; Parkinson's disease in his maternal grandfather. There is no history of Prostate cancer, Bladder Cancer, Kidney cancer, or Tremor.   Allergies Allergies  Allergen Reactions   Asenapine Other (See Comments) and Nausea And Vomiting    Increased tremors   Dextromethorphan Hbr    Guaifenesin     Latuda [Lurasidone Hcl] Other (See Comments)    Tremors     Lurasidone Other (See Comments) and Hives    Other Reaction(s): Angioedema   Phenylephrine       Home Medications  Prior to Admission medications   Medication Sig Start Date End Date Taking? Authorizing Provider  acetaminophen  (TYLENOL ) 325 MG tablet Take 1 tablet (325 mg total) by mouth every 6 (six) hours as needed for mild pain (pain score 1-3) (or temp > 100.5). 09/01/23  Yes [name], MD  albuterol  (VENTOLIN  HFA) 108 [phone] Base) MCG/ACT inhaler Inhale 2 puffs into the lungs every 6 (six) hours as needed for wheezing or shortness of breath. 12/19/23  Yes Lorren Greig PARAS, NP  aspirin  EC 81 MG tablet Take 1 tablet (81 mg total) by mouth daily. Swallow whole. 02/03/23  Yes Evelena, Nadir, MD  atorvastatin  (LIPITOR ) 80 MG tablet TAKE 1 TABLET BY MOUTH EVERY DAY 01/15/24  Yes [name], MD  budesonide -glycopyrrolate -formoterol  (BREZTRI  AEROSPHERE) 160-9-4.8 MCG/ACT AERO inhaler Inhale 2 puffs into the lungs in the morning and at bedtime. 12/19/23  Yes Lorren, Amy J, NP  celecoxib  (CELEBREX ) 100 MG capsule TAKE 2 CAPSULES BY MOUTH TWICE A DAY 04/25/24  Yes Georgina Ozell LABOR, MD  divalproex  (DEPAKOTE ) 500 MG DR tablet Take half tablet in the morning and 3 tablets at bedtime Patient taking  differently: Take half tablet in the morning, 1 in the afternoon, and 2 tablets at bedtime 04/25/24  Yes Izella Ismael NOVAK, MD  DULoxetine  (CYMBALTA ) 30 MG capsule Take 3 capsules (90 mg total) by mouth daily. Patient taking differently: Take 30 mg by mouth 3 (three) times daily. 04/25/24 06/24/24 Yes Izella Ismael NOVAK, MD  gabapentin  (NEURONTIN ) 600 MG tablet Take 1 tablet (600 mg total) by mouth daily AND 2 tablets (1,200 mg total) at bedtime. 04/25/24  Yes Izella Ismael NOVAK, MD  hydrOXYzine  (ATARAX ) 25 MG tablet Take 1 tablet (25 mg total) by mouth 2 (two) times daily as needed. Patient taking differently: Take 25 mg by mouth daily. 04/25/24  Yes Izella Ismael NOVAK, MD  isosorbide  mononitrate (IMDUR ) 30 MG 24 hr tablet TAKE 1 TABLET BY MOUTH 2 TIMES DAILY. 04/09/24  Yes Fernand Denyse LABOR, MD  Multiple Vitamins-Minerals (MENS 50+ MULTIVITAMIN) TABS Take 1 tablet by mouth in the morning.   Yes [provider]  nicotine  polacrilex (NICOTINE  MINI) 2 MG lozenge Take 1 lozenge (2 mg total) by mouth every 2 (two) hours as needed for smoking cessation. 03/26/24 06/24/24 Yes Dgayli, Belva, MD  nitroGLYCERIN  (NITROSTAT ) 0.4  MG SL tablet Place 1 tablet (0.4 mg total) under the tongue every 5 (five) minutes as needed for chest pain. 02/03/23 05/06/24 Yes Evelena Figures, MD  pantoprazole  (PROTONIX ) 40 MG tablet Take 1 tablet (40 mg total) by mouth daily. 12/19/23  Yes Lorren, Amy J, NP  QUEtiapine  (SEROQUEL ) 200 MG tablet Take 1 tablet (200 mg total) by mouth at bedtime. 04/25/24  Yes Izella Ismael NOVAK, MD  traZODone  (DESYREL ) 100 MG tablet Take 0.5 tablets (50 mg total) by mouth at bedtime for 7 days. Patient not taking: Reported on 05/06/2024 04/25/24 05/02/24  Izella Ismael NOVAK, MD          My critical care time: 45 minutes   05/24/2024, 1:45 PM  Tamela Stakes, MD  Attending Physician, Critical Care Medicine [facility] See Amion for pager If no response to pager, please call [phone]  until 7pm After 7pm, Please call E-link [phone]

## 2024-05-24 NOTE — Progress Notes (Signed)
 PHARMACY - ANTICOAGULATION CONSULT NOTE  Pharmacy Consult for heparin  >> bivalirudin  Indication: VTE, VV ECMO (10/29 - current)  Allergies  Allergen Reactions   Asenapine Other (See Comments) and Nausea And Vomiting    Increased tremors   Dextromethorphan Hbr    Guaifenesin     Latuda [Lurasidone Hcl] Other (See Comments)    Tremors     Lurasidone Other (See Comments) and Hives    Other Reaction(s): Angioedema   Phenylephrine      Patient Measurements: Height: 6' 3 (190.5 cm) Weight: 107.8 kg (237 lb 10.5 oz) IBW/kg (Calculated) : 84.5  Vital Signs: Temp: 98.6 F (37 C) (10/31 1000) Temp Source: Bladder (10/31 0400) BP: 133/48 (10/31 0815) Pulse Rate: 75 (10/31 1000)  Labs: Recent Labs    05/22/24 1710 05/22/24 1845 05/22/24 1904 05/23/24 0300 05/23/24 0445 05/23/24 0453 05/23/24 1717 05/23/24 1724 05/23/24 2355 05/24/24 0401 05/24/24 0408 05/24/24 0947 05/24/24 0948  HGB 9.4*  --    < >  --  8.9*   < > 8.9*   < >  --  9.1* 8.5*  --  9.2*  HCT 30.8*  --    < >  --  28.0*   < > 28.4*   < >  --  29.2* 25.0*  --  27.0*  PLT 201  --    < >  --  263  --  223  --   --  248  --   --   --   APTT >200*  --    < > 58*  --    < >  --    < > 39* 45*  --  44*  --   LABPROT 16.8*  --   --   --  15.3*  --   --   --   --  17.0*  --   --   --   INR 1.3*  --   --   --  1.1  --   --   --   --  1.3*  --   --   --   HEPARINUNFRC >1.10* >1.10*  --  0.33  --   --   --   --   --   --   --   --   --   CREATININE 1.11  --   --   --  0.88  --  1.00  --   --  0.84  --   --   --    < > = values in this interval not displayed.    Estimated Creatinine Clearance: 119.4 mL/min (by C-G formula based on SCr of 0.84 mg/dL).   Medical History: Past Medical History:  Diagnosis Date   Anginal pain    Anxiety    Arthritis    Bipolar 1 disorder (HCC)    Bursitis    CAD (coronary artery disease)    Stent placed 2017   Cancer (HCC)    Chronic pain    COPD (chronic obstructive pulmonary  disease) (HCC)    Current use of long term anticoagulation    DAPT (ASA + clopidogrel )   Depression    Diverticulitis    Dyspnea    GERD (gastroesophageal reflux disease)    Grade I diastolic dysfunction    Hepatitis C 2012   No longer has Hep C   HLD (hyperlipidemia)    Hypertension    MI (myocardial infarction) (HCC)    Polysubstance abuse (HCC)    cocaine,  marijuana, ETOH   PUD (peptic ulcer disease)    S/P angioplasty with stent 06/10/2016   a.) 90% stenosis of pLAD to mLAD - 2.5 x 18 mm Xience Alpine (DES x 1) placed to pLAD   S/P PTCA (percutaneous transluminal coronary angioplasty) 12/04/2019   a.) 60% in stent restenosis of DES to pLAD; LVEF 65%.   Schizophrenia (HCC)    Stroke (HCC)    Tremors    generalized   Valvular insufficiency    a.) Mild MR, TR, PR; mild to moderate AR on 03/05/2018 TTE      Assessment: 63 yoM admitted for RUL lobectomy.  Pt intubated, now having difficulty with ventilation. Placed on VV ECMO 10/29 in the evening. Pharmacy to dose IV bivalirudin .   Previously on therapeutic heparin  (HL 0.33 on 1700/h) for acute DVT of gastrocnemius vein   Hgb (8.5) and PLTs (478) are stable. No evidence of new bleeding, just some old dried blood when suctioning his ET tube.  aPTT 44s is subtherapeutic with bivalirudin  running at 0.07 mg/kg/hr. Hgb (9.2) and PLTs (248) are stable. LDH and fibrinogen are stable. Per RN, no report of pauses, issues with the line, or signs of bleeding.   There are clots noted in the 12, 3, and 9 o'clock corners of the filter with some fibrin stranding. Per HF MD, will change goal aPTT to 60-80s.   Goal of Therapy:  aPTT 60-80s Monitor platelets by anticoagulation protocol: Yes   Plan:  Increase bivalirudin  to 0.10 mg/kg/hr (~33% increase) Check aPTT now and every 4 hours until therapeutic Monitor twice daily aPTT and CBC   Thank you for allowing pharmacy to be a part of this patient's care.   Nidia Schaffer,  PharmD PGY2 Cardiology Pharmacy Resident  Please check AMION for all [facility] Pharmacy phone numbers After 10:00 PM, call Main Pharmacy [phone] 05/24/2024  10:13 AM

## 2024-05-24 NOTE — Progress Notes (Signed)
 PT Cancellation Note  Patient Details Name: Keith Gibbs MRN: [id number] DOB: 1961-04-28   Cancelled Treatment:    Reason Eval/Treat Not Completed: Patient not medically ready  Pt remains intubated, sedated and on ECMO support. Will sign off for now, but please re-order when pt alert and stable enough for PT consult.  Leontine Roads, PT, DPT [facility]  Rehabilitation Services Physical Therapist Office: [phone] Website: [email or web address]   Leontine GORMAN Roads 05/24/2024, 11:39 AM

## 2024-05-25 ENCOUNTER — Inpatient Hospital Stay (HOSPITAL_COMMUNITY): Payer: MEDICAID

## 2024-05-25 ENCOUNTER — Other Ambulatory Visit: Payer: Self-pay

## 2024-05-25 DIAGNOSIS — A419 Sepsis, unspecified organism: Secondary | ICD-10-CM | POA: Diagnosis not present

## 2024-05-25 DIAGNOSIS — T8112XA Postprocedural septic shock, initial encounter: Secondary | ICD-10-CM

## 2024-05-25 DIAGNOSIS — J9602 Acute respiratory failure with hypercapnia: Secondary | ICD-10-CM

## 2024-05-25 DIAGNOSIS — J441 Chronic obstructive pulmonary disease with (acute) exacerbation: Secondary | ICD-10-CM | POA: Diagnosis not present

## 2024-05-25 DIAGNOSIS — Z902 Acquired absence of lung [part of]: Secondary | ICD-10-CM | POA: Diagnosis not present

## 2024-05-25 DIAGNOSIS — G9341 Metabolic encephalopathy: Secondary | ICD-10-CM

## 2024-05-25 DIAGNOSIS — J9601 Acute respiratory failure with hypoxia: Secondary | ICD-10-CM | POA: Diagnosis not present

## 2024-05-25 DIAGNOSIS — I10 Essential (primary) hypertension: Secondary | ICD-10-CM | POA: Diagnosis not present

## 2024-05-25 DIAGNOSIS — J95821 Acute postprocedural respiratory failure: Secondary | ICD-10-CM

## 2024-05-25 DIAGNOSIS — J80 Acute respiratory distress syndrome: Secondary | ICD-10-CM | POA: Diagnosis not present

## 2024-05-25 LAB — BPAM RBC
Blood Product Expiration Date: 202511242359
Blood Product Expiration Date: 202511242359
Blood Product Expiration Date: 202511242359
Blood Product Expiration Date: 202511242359
Blood Product Expiration Date: 202511242359
Blood Product Expiration Date: 202511242359
Unit Type and Rh: 5100
Unit Type and Rh: 5100
Unit Type and Rh: 5100
Unit Type and Rh: 5100
Unit Type and Rh: 5100
Unit Type and Rh: 5100

## 2024-05-25 LAB — CBC WITH DIFFERENTIAL/PLATELET
Abs Immature Granulocytes: 1.21 K/uL — ABNORMAL HIGH (ref 0.00–0.07)
Basophils Absolute: 0.1 K/uL (ref 0.0–0.1)
Basophils Relative: 1 %
Eosinophils Absolute: 0.6 K/uL — ABNORMAL HIGH (ref 0.0–0.5)
Eosinophils Relative: 3 %
HCT: 29.6 % — ABNORMAL LOW (ref 39.0–52.0)
Hemoglobin: 9 g/dL — ABNORMAL LOW (ref 13.0–17.0)
Immature Granulocytes: 6 %
Lymphocytes Relative: 9 %
Lymphs Abs: 1.8 K/uL (ref 0.7–4.0)
MCH: 31.9 pg (ref 26.0–34.0)
MCHC: 30.4 g/dL (ref 30.0–36.0)
MCV: 105 fL — ABNORMAL HIGH (ref 80.0–100.0)
Monocytes Absolute: 1.4 K/uL — ABNORMAL HIGH (ref 0.1–1.0)
Monocytes Relative: 7 %
Neutro Abs: 15.9 K/uL — ABNORMAL HIGH (ref 1.7–7.7)
Neutrophils Relative %: 74 %
Platelets: 285 K/uL (ref 150–400)
RBC: 2.82 MIL/uL — ABNORMAL LOW (ref 4.22–5.81)
RDW: 15.3 % (ref 11.5–15.5)
Smear Review: NORMAL
WBC: 21 K/uL — ABNORMAL HIGH (ref 4.0–10.5)
nRBC: 0.3 % — ABNORMAL HIGH (ref 0.0–0.2)

## 2024-05-25 LAB — POCT I-STAT 7, (LYTES, BLD GAS, ICA,H+H)
Acid-Base Excess: 5 mmol/L — ABNORMAL HIGH (ref 0.0–2.0)
Acid-Base Excess: 4 mmol/L — ABNORMAL HIGH (ref 0.0–2.0)
Acid-Base Excess: 5 mmol/L — ABNORMAL HIGH (ref 0.0–2.0)
Acid-Base Excess: 3 mmol/L — ABNORMAL HIGH (ref 0.0–2.0)
Acid-Base Excess: 3 mmol/L — ABNORMAL HIGH (ref 0.0–2.0)
Acid-Base Excess: 4 mmol/L — ABNORMAL HIGH (ref 0.0–2.0)
Acid-Base Excess: 3 mmol/L — ABNORMAL HIGH (ref 0.0–2.0)
Acid-Base Excess: 3 mmol/L — ABNORMAL HIGH (ref 0.0–2.0)
Bicarbonate: 30.5 mmol/L — ABNORMAL HIGH (ref 20.0–28.0)
Bicarbonate: 29.4 mmol/L — ABNORMAL HIGH (ref 20.0–28.0)
Bicarbonate: 29.5 mmol/L — ABNORMAL HIGH (ref 20.0–28.0)
Bicarbonate: 28.1 mmol/L — ABNORMAL HIGH (ref 20.0–28.0)
Bicarbonate: 28.6 mmol/L — ABNORMAL HIGH (ref 20.0–28.0)
Bicarbonate: 29.6 mmol/L — ABNORMAL HIGH (ref 20.0–28.0)
Bicarbonate: 29.4 mmol/L — ABNORMAL HIGH (ref 20.0–28.0)
Bicarbonate: 28.8 mmol/L — ABNORMAL HIGH (ref 20.0–28.0)
Calcium, Ion: 1.24 mmol/L (ref 1.15–1.40)
Calcium, Ion: 1.24 mmol/L (ref 1.15–1.40)
Calcium, Ion: 1.25 mmol/L (ref 1.15–1.40)
Calcium, Ion: 1.24 mmol/L (ref 1.15–1.40)
Calcium, Ion: 1.25 mmol/L (ref 1.15–1.40)
Calcium, Ion: 1.26 mmol/L (ref 1.15–1.40)
Calcium, Ion: 1.26 mmol/L (ref 1.15–1.40)
Calcium, Ion: 1.26 mmol/L (ref 1.15–1.40)
HCT: 27 % — ABNORMAL LOW (ref 39.0–52.0)
HCT: 26 % — ABNORMAL LOW (ref 39.0–52.0)
HCT: 24 % — ABNORMAL LOW (ref 39.0–52.0)
HCT: 25 % — ABNORMAL LOW (ref 39.0–52.0)
HCT: 26 % — ABNORMAL LOW (ref 39.0–52.0)
HCT: 27 % — ABNORMAL LOW (ref 39.0–52.0)
HCT: 26 % — ABNORMAL LOW (ref 39.0–52.0)
HCT: 27 % — ABNORMAL LOW (ref 39.0–52.0)
Hemoglobin: 9.2 g/dL — ABNORMAL LOW (ref 13.0–17.0)
Hemoglobin: 8.8 g/dL — ABNORMAL LOW (ref 13.0–17.0)
Hemoglobin: 8.2 g/dL — ABNORMAL LOW (ref 13.0–17.0)
Hemoglobin: 8.5 g/dL — ABNORMAL LOW (ref 13.0–17.0)
Hemoglobin: 8.8 g/dL — ABNORMAL LOW (ref 13.0–17.0)
Hemoglobin: 9.2 g/dL — ABNORMAL LOW (ref 13.0–17.0)
Hemoglobin: 8.8 g/dL — ABNORMAL LOW (ref 13.0–17.0)
Hemoglobin: 9.2 g/dL — ABNORMAL LOW (ref 13.0–17.0)
O2 Saturation: 84 %
O2 Saturation: 92 %
O2 Saturation: 93 %
O2 Saturation: 89 %
O2 Saturation: 90 %
O2 Saturation: 82 %
O2 Saturation: 84 %
O2 Saturation: 85 %
Patient temperature: 38
Patient temperature: 37
Patient temperature: 36.9
Patient temperature: 36.9
Patient temperature: 37
Patient temperature: 37
Patient temperature: 37.1
Patient temperature: 37
Potassium: 4.6 mmol/L (ref 3.5–5.1)
Potassium: 5 mmol/L (ref 3.5–5.1)
Potassium: 4.4 mmol/L (ref 3.5–5.1)
Potassium: 4.4 mmol/L (ref 3.5–5.1)
Potassium: 4.5 mmol/L (ref 3.5–5.1)
Potassium: 4.8 mmol/L (ref 3.5–5.1)
Potassium: 4.8 mmol/L (ref 3.5–5.1)
Potassium: 5 mmol/L (ref 3.5–5.1)
Sodium: 149 mmol/L — ABNORMAL HIGH (ref 135–145)
Sodium: 150 mmol/L — ABNORMAL HIGH (ref 135–145)
Sodium: 149 mmol/L — ABNORMAL HIGH (ref 135–145)
Sodium: 149 mmol/L — ABNORMAL HIGH (ref 135–145)
Sodium: 150 mmol/L — ABNORMAL HIGH (ref 135–145)
Sodium: 151 mmol/L — ABNORMAL HIGH (ref 135–145)
Sodium: 150 mmol/L — ABNORMAL HIGH (ref 135–145)
Sodium: 150 mmol/L — ABNORMAL HIGH (ref 135–145)
TCO2: 32 mmol/L (ref 22–32)
TCO2: 31 mmol/L (ref 22–32)
TCO2: 31 mmol/L (ref 22–32)
TCO2: 30 mmol/L (ref 22–32)
TCO2: 30 mmol/L (ref 22–32)
TCO2: 31 mmol/L (ref 22–32)
TCO2: 31 mmol/L (ref 22–32)
TCO2: 30 mmol/L (ref 22–32)
pCO2 arterial: 53.2 mmHg — ABNORMAL HIGH (ref 32–48)
pCO2 arterial: 48.1 mmHg — ABNORMAL HIGH (ref 32–48)
pCO2 arterial: 44.3 mmHg (ref 32–48)
pCO2 arterial: 46.5 mmHg (ref 32–48)
pCO2 arterial: 48.9 mmHg — ABNORMAL HIGH (ref 32–48)
pCO2 arterial: 51.1 mmHg — ABNORMAL HIGH (ref 32–48)
pCO2 arterial: 53 mmHg — ABNORMAL HIGH (ref 32–48)
pCO2 arterial: 51.6 mmHg — ABNORMAL HIGH (ref 32–48)
pH, Arterial: 7.371 (ref 7.35–7.45)
pH, Arterial: 7.394 (ref 7.35–7.45)
pH, Arterial: 7.43 (ref 7.35–7.45)
pH, Arterial: 7.376 (ref 7.35–7.45)
pH, Arterial: 7.389 (ref 7.35–7.45)
pH, Arterial: 7.37 (ref 7.35–7.45)
pH, Arterial: 7.353 (ref 7.35–7.45)
pH, Arterial: 7.354 (ref 7.35–7.45)
pO2, Arterial: 54 mmHg — ABNORMAL LOW (ref 83–108)
pO2, Arterial: 66 mmHg — ABNORMAL LOW (ref 83–108)
pO2, Arterial: 66 mmHg — ABNORMAL LOW (ref 83–108)
pO2, Arterial: 58 mmHg — ABNORMAL LOW (ref 83–108)
pO2, Arterial: 61 mmHg — ABNORMAL LOW (ref 83–108)
pO2, Arterial: 48 mmHg — ABNORMAL LOW (ref 83–108)
pO2, Arterial: 53 mmHg — ABNORMAL LOW (ref 83–108)
pO2, Arterial: 53 mmHg — ABNORMAL LOW (ref 83–108)

## 2024-05-25 LAB — BASIC METABOLIC PANEL WITH GFR
Anion gap: 8 (ref 5–15)
Anion gap: 8 (ref 5–15)
BUN: 48 mg/dL — ABNORMAL HIGH (ref 8–23)
BUN: 47 mg/dL — ABNORMAL HIGH (ref 8–23)
CO2: 29 mmol/L (ref 22–32)
CO2: 27 mmol/L (ref 22–32)
Calcium: 8.2 mg/dL — ABNORMAL LOW (ref 8.9–10.3)
Calcium: 8.3 mg/dL — ABNORMAL LOW (ref 8.9–10.3)
Chloride: 111 mmol/L (ref 98–111)
Chloride: 113 mmol/L — ABNORMAL HIGH (ref 98–111)
Creatinine, Ser: 0.85 mg/dL (ref 0.61–1.24)
Creatinine, Ser: 0.75 mg/dL (ref 0.61–1.24)
GFR, Estimated: >60 mL/min (ref >60)
GFR, Estimated: >60 mL/min (ref >60)
Glucose, Bld: 124 mg/dL — ABNORMAL HIGH (ref 70–99)
Glucose, Bld: 143 mg/dL — ABNORMAL HIGH (ref 70–99)
Potassium: 4.9 mmol/L (ref 3.5–5.1)
Potassium: 4.8 mmol/L (ref 3.5–5.1)
Sodium: 148 mmol/L — ABNORMAL HIGH (ref 135–145)
Sodium: 148 mmol/L — ABNORMAL HIGH (ref 135–145)

## 2024-05-25 LAB — GLUCOSE, CAPILLARY
Glucose-Capillary: 131 mg/dL — ABNORMAL HIGH (ref 70–99)
Glucose-Capillary: 116 mg/dL — ABNORMAL HIGH (ref 70–99)
Glucose-Capillary: 142 mg/dL — ABNORMAL HIGH (ref 70–99)
Glucose-Capillary: 162 mg/dL — ABNORMAL HIGH (ref 70–99)
Glucose-Capillary: 144 mg/dL — ABNORMAL HIGH (ref 70–99)

## 2024-05-25 LAB — ACID FAST SMEAR (AFB, MYCOBACTERIA): Acid Fast Smear: NEGATIVE

## 2024-05-25 LAB — TYPE AND SCREEN
ABO/RH(D): O POS
Antibody Screen: NEGATIVE
Unit division: 0
Unit division: 0
Unit division: 0
Unit division: 0
Unit division: 0
Unit division: 0

## 2024-05-25 LAB — APTT
aPTT: 63 s — ABNORMAL HIGH (ref 24–36)
aPTT: 69 s — ABNORMAL HIGH (ref 24–36)

## 2024-05-25 LAB — CG4 I-STAT (LACTIC ACID)
Lactic Acid, Venous: 0.9 mmol/L (ref 0.5–1.9)
Lactic Acid, Venous: 0.7 mmol/L (ref 0.5–1.9)
Lactic Acid, Venous: 0.7 mmol/L (ref 0.5–1.9)

## 2024-05-25 LAB — CBC
HCT: 30.8 % — ABNORMAL LOW (ref 39.0–52.0)
Hemoglobin: 9.6 g/dL — ABNORMAL LOW (ref 13.0–17.0)
MCH: 32.9 pg (ref 26.0–34.0)
MCHC: 31.2 g/dL (ref 30.0–36.0)
MCV: 105.5 fL — ABNORMAL HIGH (ref 80.0–100.0)
Platelets: 269 K/uL (ref 150–400)
RBC: 2.92 MIL/uL — ABNORMAL LOW (ref 4.22–5.81)
RDW: 15.4 % (ref 11.5–15.5)
WBC: 22.6 K/uL — ABNORMAL HIGH (ref 4.0–10.5)
nRBC: 0.3 % — ABNORMAL HIGH (ref 0.0–0.2)

## 2024-05-25 LAB — HEPATIC FUNCTION PANEL
ALT: 59 U/L — ABNORMAL HIGH (ref 0–44)
AST: 57 U/L — ABNORMAL HIGH (ref 15–41)
Albumin: 2.1 g/dL — ABNORMAL LOW (ref 3.5–5.0)
Alkaline Phosphatase: 92 U/L (ref 38–126)
Bilirubin, Direct: 0.2 mg/dL (ref 0.0–0.2)
Indirect Bilirubin: 0.3 mg/dL (ref 0.3–0.9)
Total Bilirubin: 0.5 mg/dL (ref 0.0–1.2)
Total Protein: 5.2 g/dL — ABNORMAL LOW (ref 6.5–8.1)

## 2024-05-25 LAB — FIBRINOGEN: Fibrinogen: 561 mg/dL — ABNORMAL HIGH (ref 210–475)

## 2024-05-25 LAB — MAGNESIUM: Magnesium: 2.5 mg/dL — ABNORMAL HIGH (ref 1.7–2.4)

## 2024-05-25 LAB — PROTIME-INR
INR: 1.7 — ABNORMAL HIGH (ref 0.8–1.2)
Prothrombin Time: 20.5 s — ABNORMAL HIGH (ref 11.4–15.2)

## 2024-05-25 LAB — LACTATE DEHYDROGENASE: LDH: 417 U/L — ABNORMAL HIGH (ref 98–192)

## 2024-05-25 MED ORDER — SODIUM CHLORIDE 0.9% FLUSH
10.0000 mL | Freq: Two times a day (BID) | INTRAVENOUS | Status: DC
  Administered 2024-05-25 – 2024-05-26 (×2): 10 mL
  Administered 2024-05-26: 40 mL
  Administered 2024-05-27 – 2024-06-04 (×16): 10 mL

## 2024-05-25 MED ORDER — SODIUM CHLORIDE 0.9% FLUSH: 10.0000 mL | INTRAVENOUS | Status: DC | PRN

## 2024-05-25 MED ORDER — MIDAZOLAM BOLUS VIA INFUSION
0.0000 mg | INTRAVENOUS | Status: DC | PRN
  Administered 2024-05-25 – 2024-05-29 (×11): 5 mg via INTRAVENOUS

## 2024-05-25 NOTE — Progress Notes (Signed)
 Advanced Heart Failure Rounding Note   Subjective:     Placed on VV ECMO day #3  Nimbex at 3 dilaudid  4 versed  15  Remains intubated/sedated/paralyzed. Vent @ FiO2 60% PEEP 10, decreased ventilator setting today for lung rest, TV now in the low 100s  On micafungin for candida fungemia (Bcx and BAL). Clots noted in the oxygenator at 3 and 9, streaking at 12.     Objective:   Weight Range:  Vital Signs:   Temp:  [98.2 F (36.8 C)-98.8 F (37.1 C)] 98.4 F (36.9 C) (11/01 1500) Pulse Rate:  [63-108] 71 (11/01 1500) Resp:  [15-80] 27 (11/01 1500) BP: (96-126)/(62-70) 101/62 (11/01 0800) SpO2:  [84 %-100 %] 89 % (11/01 1500) Arterial Line BP: (104-174)/(39-59) 133/50 (11/01 1500) FiO2 (%):  [60 %] 60 % (11/01 1418) Weight:  [107 kg] 107 kg (11/01 0700) Last BM Date : 05/25/24  Weight change: Filed Weights   05/23/24 0500 05/24/24 0400 05/25/24 0700  Weight: 109.3 kg 107.8 kg 107 kg    Intake/Output:   Intake/Output Summary (Last 24 hours) at 05/25/2024 1508 Last data filed at 05/25/2024 1500 Gross per 24 hour  Intake 3603.09 ml  Output 3155 ml  Net 448.09 ml     Physical Exam: General:  intubated/sedated/paralyzed HEENT: + ETT Neck: supple. RIJ ECMO cannula Cor: Regular rate & rhythm.  Lungs: diminshed, coarse Abdomen: soft, nontender, nondistended.Good bowel sounds. Extremities: no cyanosis, clubbing, rash, edema Neuro:sedated/paralyzed   Telemetry: NSR 60-70s Personally reviewed   Labs: Basic Metabolic Panel: Recent Labs  Lab 05/20/24 0500 05/20/24 1019 05/21/24 0421 05/21/24 1424 05/22/24 0451 05/22/24 0746 05/23/24 0445 05/23/24 0453 05/23/24 1717 05/23/24 1724 05/24/24 0401 05/24/24 0408 05/24/24 1700 05/24/24 1931 05/25/24 0411 05/25/24 0417 05/25/24 0842 05/25/24 1235 05/25/24 1327  NA 135   < > 139   < > 143   < > 146*   < > 145   < > 147*   < > 149*   < > 148* 150* 149* 149* 150*  K 4.6   < > 4.7   < > 4.8   < > 5.3*   < >  5.0   < > 5.1   < > 4.7   < > 4.9 5.0 4.4 4.4 4.5  CL 94*  --  100  --  100   < > 106  --  106  --  107  --  110  --  111  --   --   --   --   CO2 25  --  29  --  29   < > 29  --  29  --  29  --  28  --  29  --   --   --   --   GLUCOSE 185*  --  161*  --  134*   < > 113*  --  160*  --  109*  --  125*  --  124*  --   --   --   --   BUN 47*  --  48*  --  51*   < > 46*  --  50*  --  50*  --  48*  --  48*  --   --   --   --   CREATININE 1.85*  --  1.12  --  0.98   < > 0.88  --  1.00  --  0.84  --  0.77  --  0.85  --   --   --   --  CALCIUM  7.6*  --  7.6*  --  7.9*   < > 7.5*  --  7.8*  --  8.2*  --  8.3*  --  8.2*  --   --   --   --   MG 2.6*  --  3.3*  --  3.1*  --  3.1*  --   --   --  3.0*  --   --   --  2.5*  --   --   --   --   PHOS 7.1*  --  3.2  --  3.2  --  3.1  --   --   --  3.7  --   --   --   --   --   --   --   --    < > = values in this interval not displayed.    Liver Function Tests: Recent Labs  Lab 05/22/24 0451 05/22/24 1710 05/23/24 0445 05/24/24 0401 05/25/24 0411  AST 42* 54* 31 56* 57*  ALT 25 28 23  50* 59*  ALKPHOS 92 79 79 92 92  BILITOT 0.4 0.6 0.5 0.7 0.5  PROT 5.9* 5.1* 5.2* 5.3* 5.2*  ALBUMIN  1.9* 1.6* 1.8* 1.9* 2.1*   No results for input(s): LIPASE, AMYLASE in the last 168 hours. No results for input(s): AMMONIA in the last 168 hours.  CBC: Recent Labs  Lab 05/21/24 0421 05/21/24 1424 05/22/24 0451 05/22/24 0746 05/23/24 0445 05/23/24 0453 05/23/24 1717 05/23/24 1724 05/24/24 0401 05/24/24 0408 05/24/24 1700 05/24/24 1931 05/25/24 0411 05/25/24 0417 05/25/24 0842 05/25/24 1235 05/25/24 1327  WBC 16.2*  --  19.7*   < > 26.7*  --  18.9*  --  21.2*  --  20.0*  --  21.0*  --   --   --   --   NEUTROABS 11.7*  --  13.5*  --  18.6*  --   --   --  15.5*  --   --   --  15.9*  --   --   --   --   HGB 10.3*   < > 10.4*   < > 8.9*   < > 8.9*   < > 9.1*   < > 9.0*   < > 9.0* 8.8* 8.2* 8.8* 8.5*  HCT 30.2*   < > 32.0*   < > 28.0*   < > 28.4*   < >  29.2*   < > 28.8*   < > 29.6* 26.0* 24.0* 26.0* 25.0*  MCV 96.5  --  100.6*   < > 102.2*  --  103.3*  --  104.3*  --  104.3*  --  105.0*  --   --   --   --   PLT 177  --  215   < > 263  --  223  --  248  --  268  --  285  --   --   --   --    < > = values in this interval not displayed.    Cardiac Enzymes: No results for input(s): CKTOTAL, CKMB, CKMBINDEX, TROPONINI in the last 168 hours.  BNP: BNP (last 3 results) Recent Labs    05/30/23 1059 03/20/24 1419  BNP 34.3 20.6    ProBNP (last 3 results) No results for input(s): PROBNP in the last 8760 hours.      Medications:     Scheduled Medications:  arformoterol  15 mcg Nebulization BID   artificial  tears  1 Application Both Eyes Q8H   budesonide  (PULMICORT ) nebulizer solution  0.25 mg Nebulization BID   Chlorhexidine  Gluconate Cloth  6 each Topical Daily   clonazePAM  1 mg Per Tube BID   feeding supplement (PROSource TF20)  60 mL Per Tube QID   free water   150 mL Per Tube Q6H   gabapentin   600 mg Per Tube Daily   And   gabapentin   1,200 mg Per Tube QHS   insulin aspart  0-15 Units Subcutaneous Q4H   mouth rinse  15 mL Mouth Rinse Q2H   oxyCODONE   10 mg Per Tube QID   pantoprazole  (PROTONIX ) IV  40 mg Intravenous QHS   polyethylene glycol  17 g Per Tube Daily   QUEtiapine   200 mg Per Tube BID   revefenacin  175 mcg Nebulization Daily   senna-docusate  2 tablet Per Tube QHS   sodium chloride  flush  10-40 mL Intracatheter Q12H   sodium chloride  flush  10-40 mL Intracatheter Q12H    Infusions:  albumin  human Stopped (05/24/24 1506)   bivalirudin  (ANGIOMAX ) 250 mg in sodium chloride  0.9 % 500 mL (0.5 mg/mL) infusion 0.15 mg/kg/hr (05/25/24 1500)   cisatracurium (NIMBEX) 200 mg in sodium chloride  0.9 % 100 mL (2 mg/mL) infusion 3 mcg/kg/min (05/25/24 1500)   feeding supplement (VITAL 1.5 CAL) 50 mL/hr at 05/25/24 1500   HYDROmorphone  4 mg/hr (05/25/24 1500)   ketamine  (KETALAR ) adult infusion 0.5 mg/kg/hr  (05/25/24 1500)   meropenem (MERREM) IV Stopped (05/25/24 1348)   micafungin (MYCAMINE) 200 mg in sodium chloride  0.9 % 100 mL IVPB Stopped (05/24/24 1817)   midazolam  15 mg/hr (05/25/24 1500)   niCARDipine Stopped (05/24/24 2221)   norepinephrine (LEVOPHED) Adult infusion Stopped (05/25/24 1424)    PRN Medications: albumin  human, hydrALAZINE , HYDROmorphone , hydrOXYzine , ipratropium-albuterol , lactulose, midazolam , ondansetron  (ZOFRAN ) IV, mouth rinse, mouth rinse, sodium chloride  flush, sodium chloride  flush   Assessment/Plan:   1. 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 Diureses as needed - Transfuse hgb < 7.5 2. RUL lung CA s/p lobectomy 3. COPD with ongoing tobacco use 4. Bipolar d/o 5. CAD 6. Candida glabrata fungemia/PNA - ID following - on micafungin - steroids off - Can likely deescalate abx on Monday 7. HTN - ensure adequate sedation under paralytics - add cardene as needed  Remains critically ill with ARDS/fungal PNA/PTX on top of severe underlying lung disease. Continue rest settings on vent per CCM, decreased today during MDS rounds. Plan trach Monday with ENT  CRITICAL CARE Performed by: Morene JINNY Brownie   Total critical care time: 45 minutes  Critical care time was exclusive of separately billable procedures and treating other patients.  Critical care was necessary to treat or prevent imminent or life-threatening deterioration.  Critical care was time spent personally by me on the following activities: development of treatment plan  with patient and/or surrogate as well as nursing, discussions with consultants, evaluation of patient's response to treatment, examination of patient, obtaining history from patient or surrogate, ordering and performing treatments and interventions, ordering and review of laboratory studies,  ordering and review of radiographic studies, pulse oximetry and re-evaluation of patient's condition.

## 2024-05-25 NOTE — Progress Notes (Signed)
 PHARMACY - ANTICOAGULATION CONSULT NOTE  Pharmacy Consult for heparin  >> bivalirudin  Indication: VTE, VV ECMO (10/29 - current)  Allergies  Allergen Reactions   Asenapine Other (See Comments) and Nausea And Vomiting    Increased tremors   Dextromethorphan Hbr    Guaifenesin     Latuda [Lurasidone Hcl] Other (See Comments)    Tremors     Lurasidone Other (See Comments) and Hives    Other Reaction(s): Angioedema   Phenylephrine      Patient Measurements: Height: 6' 3 (190.5 cm) Weight: 107 kg (235 lb 14.3 oz) IBW/kg (Calculated) : 84.5  Vital Signs: Temp: 98.6 F (37 C) (11/01 1715) BP: 97/62 (11/01 1600) Pulse Rate: 78 (11/01 1715)  Labs: Recent Labs    05/22/24 1845 05/22/24 1904 05/23/24 0300 05/23/24 0445 05/23/24 0453 05/24/24 0401 05/24/24 0408 05/24/24 1700 05/24/24 1931 05/24/24 2200 05/25/24 0411 05/25/24 0417 05/25/24 1327 05/25/24 1604 05/25/24 1607  HGB  --    < >  --  8.9*   < > 9.1*   < > 9.0*   < >  --  9.0*   < > 8.5* 9.6* 9.2*  HCT  --    < >  --  28.0*   < > 29.2*   < > 28.8*   < >  --  29.6*   < > 25.0* 30.8* 27.0*  PLT  --    < >  --  263   < > 248  --  268  --   --  285  --   --  269  --   APTT  --    < > 58*  --    < > 45*   < > 52*  --  63* 63*  --   --  69*  --   LABPROT  --   --   --  15.3*  --  17.0*  --   --   --   --  20.5*  --   --   --   --   INR  --   --   --  1.1  --  1.3*  --   --   --   --  1.7*  --   --   --   --   HEPARINUNFRC >1.10*  --  0.33  --   --   --   --   --   --   --   --   --   --   --   --   CREATININE  --   --   --  0.88   < > 0.84  --  0.77  --   --  0.85  --   --  0.75  --    < > = values in this interval not displayed.    Estimated Creatinine Clearance: 125 mL/min (by C-G formula based on SCr of 0.75 mg/dL).   Medical History: Past Medical History:  Diagnosis Date   Anginal pain    Anxiety    Arthritis    Bipolar 1 disorder (HCC)    Bursitis    CAD (coronary artery disease)    Stent placed 2017    Cancer (HCC)    Chronic pain    COPD (chronic obstructive pulmonary disease) (HCC)    Current use of long term anticoagulation    DAPT (ASA + clopidogrel )   Depression    Diverticulitis    Dyspnea    GERD (gastroesophageal reflux disease)  Grade I diastolic dysfunction    Hepatitis C 2012   No longer has Hep C   HLD (hyperlipidemia)    Hypertension    MI (myocardial infarction) (HCC)    Polysubstance abuse (HCC)    cocaine, marijuana, ETOH   PUD (peptic ulcer disease)    S/P angioplasty with stent 06/10/2016   a.) 90% stenosis of pLAD to mLAD - 2.5 x 18 mm Xience Alpine (DES x 1) placed to pLAD   S/P PTCA (percutaneous transluminal coronary angioplasty) 12/04/2019   a.) 60% in stent restenosis of DES to pLAD; LVEF 65%.   Schizophrenia (HCC)    Stroke (HCC)    Tremors    generalized   Valvular insufficiency    a.) Mild MR, TR, PR; mild to moderate AR on 03/05/2018 TTE      Assessment: 63 yoM admitted for RUL lobectomy.  Pt intubated, now having difficulty with ventilation. Placed on VV ECMO 10/29 in the evening. Pharmacy to dose IV bivalirudin .   Previously on therapeutic heparin  (HL 0.33 on 1700/h) for acute DVT of gastrocnemius vein   aPTT 69s is therapeutic with bivalirudin  running at 0.15 mg/kg/hr. Per RN, no report of pauses, issues with the line, or signs of bleeding.   There are clots noted in the 12, 3, and 9 o'clock corners of the filter with some fibrin stranding. The clotting is slightly worsening per report despite the increasing aPTT.  Goal of Therapy:  aPTT 60-80s Monitor platelets by anticoagulation protocol: Yes   Plan:  Continue bivalirudin  at 0.15 mg/kg/hr  Monitor twice daily aPTT and CBC   Thank you for allowing pharmacy to be a part of this patient's care.   Larraine Brazier, PharmD Clinical Pharmacist 05/25/2024  5:36 PM **Pharmacist phone directory can now be found on [email or web address] ([email or web address]).  Listed under [facility].

## 2024-05-25 NOTE — Progress Notes (Signed)
 NAME:  Keith Gibbs, MRN:  [id number], DOB:  [birth year], LOS: 16 ADMISSION DATE:  05/09/2024, CONSULTATION DATE: 05/14/2024 REFERRING MD: CTS, CHIEF COMPLAINT: Acute on chronic hypoxic respiratory failure  History of Present Illness:  63 yo male presented for lobectomy with CTS. Progressing as expected post operatively with CT in place and was on 4E when this morning he decompensated with worsening hypoxemia and increased wob. CTS transferred to ICU and requested CCM evaluation.   Patient went into ARDS and eventually cannulated for VV ECMO.    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  He was difficult to oxygenate and ventilate therefore APRV started.  Patient seems to be tolerating the APRV. 10/29 -nitric oxide is weaned off overnight.  It has not helped.  It did not change his respiratory status.  Patient continues to have ARDS with bilateral pulmonary infiltrates.  At this stage we decided to proning.  Chest tube is hooked up to suction. 10/29 -patient's oxygenation improved with proning but ventilation got worse.  At this stage given the ARDS a collective decision is made to cannulate the patient for VV ECMO.  Family was in agreement. 10/30 -blood cultures from 05/19/2024 and BAL cultures from 05/19/2024 grew Candida glabrata  Interim History / Subjective:  Patient did not tolerate weaning of Nimbex yesterday.  He had to be started on ketamine  and increase Seroquel  along with oral medications.  He is compliant with the ECMO and remain settings for ventilator.  He has diuresed well with almost 14 L negative  overall.  ECMO flows are good.  Repeat blood cultures from 05/23/2024 and 05/24/2024 negative so far.    Objective    Blood pressure 101/62, pulse 65, temperature 98.4 F (36.9 C), resp. rate (!) 24, height 6' 3 (1.905 m), weight 107 kg, SpO2 96%. CVP:  [5 mmHg-48 mmHg] 5 mmHg  Vent Mode: PCV FiO2 (%):  [60 %] 60 % Set Rate:  [15 bmp] 15 bmp PEEP:  [10 cmH20] 10 cmH20 Plateau Pressure:  [20 cmH20-23 cmH20] 20 cmH20   Intake/Output Summary (Last 24 hours) at 05/25/2024 1038 Last data filed at 05/25/2024 1000 Gross per 24 hour  Intake 3548.16 ml  Output 3545 ml  Net 3.16 ml   Filed Weights   05/23/24 0500 05/24/24 0400 05/25/24 0700  Weight: 109.3 kg 107.8 kg 107 kg    Examination: General: Critically ill appearing man intubated, right IJ Avalon catheter.  Deeply sedated and paralyzed today. HEENT: Both pupils equal sluggish reacting to light there is right internal jugular dual-lumen ECMO catheter.  Lungs: Equal air entry bilaterally.  Crackles bibasal.  Prolonged expiratory phase Cardiovascular: S1-S2 heard no S3 no murmur Abdomen: Soft nontender nondistended Extremities: No trauma mild pedal edema.  No deformities. 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  No high fevers today.  Current temperature is 99.7.  Several modes  of ventilation including nitric oxide did not help.  Pulmonary did not help.  Eventually patient went on VV ECMO for ARDS management.  Acute hypoxic hypercapnic respiratory failure ARDS Status post right upper lobe pneumonectomy History of COPD with acute exacerbation FEV1/ FVC on 9/25 is 74.  PFTs read as mild obstructive lung disease. Acute pulmonary  edema Possible aspiration Adenocarcinoma of the lung status post right upper lobe lobectomy Right pneumothorax was pneumonectomy status post chest tube placement  -Patient is cannulated for VV ECMO on 10/29 for ARDS as we failed to oxygenate and ventilate and several modes of ventilation. ECMO: Flow is 4.07 L/min Delta P is 27 eFiO2 88% Sweep is 8 L/min On bivalirudin  for anticoagulation -will be held at 8 PM tomorrow for tracheostomy on Monday  Ventilator: Pressure control ventilation with 60% FiO2 PEEP is 10. Inspiratory pressure is  peak pressure is 24.    Sedation with Versed , Ketamine  and Dilaudid  Oh Seroquel  200 twice daily and oral oxycodone  and oral Klonopin Will try to wean down and stop Nimbex. Currently BIS goal of 40  - Nitric oxide is weaned down on 05/22/2024 - Continue Pulmicort  - Completed Decadron  therapy on 05/23/2024. - DuoNebs every 4 hours - Monitor chest tube output.  Currently there is air leak. - Off IV Lasix since 05/24/2024. - Tube feeds reach goal. - Patient is on the list for tracheostomy on Monday.  Patient's stop anticoagulation at 8 PM tomorrow.    Septic shock Fungal pneumonia -Candida glabrata from BAL on 05/19/2024 Fungemia -Candida glabrata on blood cultures from 05/19/2024 Pneumonia- Pseudomonas growing from BAL 05/19/2024 - Continue  meropenem  - vancomycin  stopped 10/31 - On IV micafungin -Appreciate ID input. - Patient is off vasopressor therapy.  Acute metabolic encephalopathy ICU delirium, in the setting of patient with a history of anxiety, and schizoaffective disorder bipolar type  - Currently on Versed , ketamine  and Dilaudid . - On oral Neurontin  and Seroquel . -On oral oxycodone  and Klonopin -Try to wean down Nimbex if successful will change preschools to RASS and try to wean down Versed . - Stop celocoxib and hold cymbalta  as we cannot crush the medication for tube - Sodium valproate will be held.  Acute Kidney Injury -  likely pre renal from septic shock this is resolved. - Monitor renal functions and urine output. - Monitor electrolytes and replace accordingly.  DVT Lower extremity - Heparin  changed to bivalirudin  per ECMO protocol - Doppler shows lower extremity DVT in gastronemius vein   Increased NG output - Abdominal x-ray did not show any ileus. - Tube feeds are ongoing.    Anemia - Hemoglobin stable at 10.4.   Full Code On bivalirudin . On Protonix  Currently tube feeds at goal 60 cc an hour Had a lot of bowel movements yesterday after sorbitol Will change left IJ to a PICC line.    Labs   CBC: Recent Labs  Lab 05/21/24 0421 05/21/24 1424 05/22/24 0451 05/22/24 0746 05/23/24 0445 05/23/24 0453 05/23/24 1717 05/23/24 1724 05/24/24 0401 05/24/24 0408 05/24/24 1700 05/24/24 1931 05/24/24 2143 05/25/24 0411 05/25/24 0417 05/25/24 0842  WBC 16.2*  --  19.7*   < > 26.7*  --  18.9*  --  21.2*  --  20.0*  --   --  21.0*  --   --   NEUTROABS 11.7*  --  13.5*  --  18.6*  --   --   --  15.5*  --   --   --   --  15.9*  --   --  HGB 10.3*   < > 10.4*   < > 8.9*   < > 8.9*   < > 9.1*   < > 9.0* 9.2* 9.9* 9.0* 8.8* 8.2*  HCT 30.2*   < > 32.0*   < > 28.0*   < > 28.4*   < > 29.2*   < > 28.8* 27.0* 29.0* 29.6* 26.0* 24.0*  MCV 96.5  --  100.6*   < > 102.2*  --  103.3*  --  104.3*  --  104.3*  --   --  105.0*  --   --   PLT 177  --  215   < > 263  --  223  --  248  --  268  --   --  285  --   --    < > = values in this interval not displayed.    Basic Metabolic Panel: Recent Labs  Lab 05/20/24 0500 05/20/24 1019 05/21/24 0421 05/21/24 1424 05/22/24 0451 05/22/24 0746 05/23/24 0445 05/23/24 0453 05/23/24 1717 05/23/24 1724 05/24/24 0401 05/24/24 0408 05/24/24 1700 05/24/24 1931 05/24/24 2143 05/25/24 0411 05/25/24 0417 05/25/24 0842  NA 135   < > 139   < > 143   < > 146*   < > 145   < > 147*   < > 149* 149* 149* 148* 150* 149*  K 4.6   < > 4.7   < > 4.8   < > 5.3*   < >  5.0   < > 5.1   < > 4.7 4.6 4.6 4.9 5.0 4.4  CL 94*  --  100  --  100   < > 106  --  106  --  107  --  110  --   --  111  --   --   CO2 25  --  29  --  29   < > 29  --  29  --  29  --  28  --   --  29  --   --   GLUCOSE 185*  --  161*  --  134*   < > 113*  --  160*  --  109*  --  125*  --   --  124*  --   --   BUN 47*  --  48*  --  51*   < > 46*  --  50*  --  50*  --  48*  --   --  48*  --   --   CREATININE 1.85*  --  1.12  --  0.98   < > 0.88  --  1.00  --  0.84  --  0.77  --   --  0.85  --   --   CALCIUM  7.6*  --  7.6*  --  7.9*   < > 7.5*  --  7.8*  --  8.2*  --  8.3*  --   --  8.2*  --   --   MG 2.6*  --  3.3*  --  3.1*  --  3.1*  --   --   --  3.0*  --   --   --   --  2.5*  --   --   PHOS 7.1*  --  3.2  --  3.2  --  3.1  --   --   --  3.7  --   --   --   --   --   --   --    < > =  values in this interval not displayed.   GFR: Estimated Creatinine Clearance: 117.6 mL/min (by C-G formula based on SCr of 0.85 mg/dL). Recent Labs  Lab 05/23/24 0458 05/23/24 1717 05/23/24 1734 05/24/24 0401 05/24/24 0409 05/24/24 1700 05/25/24 0411 05/25/24 0435  WBC  --  18.9*  --  21.2*  --  20.0* 21.0*  --   LATICACIDVEN 1.1  --  1.3  --  1.1  --   --  0.9    Liver Function Tests: Recent Labs  Lab 05/22/24 0451 05/22/24 1710 05/23/24 0445 05/24/24 0401 05/25/24 0411  AST 42* 54* 31 56* 57*  ALT 25 28 23  50* 59*  ALKPHOS 92 79 79 92 92  BILITOT 0.4 0.6 0.5 0.7 0.5  PROT 5.9* 5.1* 5.2* 5.3* 5.2*  ALBUMIN  1.9* 1.6* 1.8* 1.9* 2.1*    No results for input(s): LIPASE, AMYLASE in the last 168 hours. No results for input(s): AMMONIA in the last 168 hours.  ABG    Component Value Date/Time   PHART 7.430 05/25/2024 0842   PCO2ART 44.3 05/25/2024 0842   PO2ART 66 (L) 05/25/2024 0842   HCO3 29.5 (H) 05/25/2024 0842   TCO2 31 05/25/2024 0842   ACIDBASEDEF 1.0 05/19/2024 1154   O2SAT 93 05/25/2024 0842     Coagulation Profile: Recent Labs  Lab 05/22/24 1710 05/23/24 0445  05/24/24 0401 05/25/24 0411  INR 1.3* 1.1 1.3* 1.7*    Cardiac Enzymes: No results for input(s): CKTOTAL, CKMB, CKMBINDEX, TROPONINI in the last 168 hours.  HbA1C: Hgb A1c MFr Bld  Date/Time Value Ref Range Status  02/12/2024 08:40 AM 5.3 4.8 - 5.6 % Final    Comment:             Prediabetes: 5.7 - 6.4          Diabetes: >6.4          Glycemic control for adults with diabetes: <7.0   04/26/2022 03:30 PM 5.2 4.8 - 5.6 % Final    Comment:    (NOTE) Pre diabetes:          5.7%-6.4%  Diabetes:              >6.4%  Glycemic control for   <7.0% adults with diabetes     CBG: Recent Labs  Lab 05/24/24 1221 05/24/24 1617 05/24/24 1928 05/24/24 2223 05/25/24 0415  GLUCAP 142* 140* 148* 180* 131*    Review of Systems:   Negative except above  Past Medical History:  He,  has a past medical history of Anginal pain, Anxiety, Arthritis, Bipolar 1 disorder (HCC), Bursitis, CAD (coronary artery disease), Cancer (HCC), Chronic pain, COPD (chronic obstructive pulmonary disease) (HCC), Current use of long term anticoagulation, Depression, Diverticulitis, Dyspnea, GERD (gastroesophageal reflux disease), Grade I diastolic dysfunction, Hepatitis C (2012), HLD (hyperlipidemia), Hypertension, MI (myocardial infarction) (HCC), Polysubstance abuse (HCC), PUD (peptic ulcer disease), S/P angioplasty with stent (06/10/2016), S/P PTCA (percutaneous transluminal coronary angioplasty) (12/04/2019), Schizophrenia (HCC), Stroke (HCC), Tremors, and Valvular insufficiency.   Surgical History:   Past Surgical History:  Procedure Laterality Date   ABDOMINAL SURGERY     removed small piece of intestines due to [facility] Diverticulosis   ANTERIOR LAT LUMBAR FUSION N/A 08/23/2022   Procedure: DIRECT LATERAL INTERBODY FUSION  LUMBAR TWO- LUMBAR THREE, LUMBAR THREE-LUMBAR FOUR , EXPLORE AND EXTEND FUSION LUMBAR TWO-LUMBAR FIVE, POSTERIOR DECOMPRESSION LUMBAR THREE-LUMBAR FOUR, LEFT LUMBAR TWO-LUMBAR  THREE;  Surgeon: Debby Dorn MATSU, MD;  Location: [facility] OR;  Service: Neurosurgery;  Laterality: N/A;  APPENDECTOMY     BACK SURGERY     CARDIAC CATHETERIZATION Left 06/10/2016   Procedure: Left Heart Cath and Coronary Angiography;  Surgeon: Denyse DELENA Bathe, MD;  Location: [facility] INVASIVE CV LAB;  Service: Cardiovascular;  Laterality: Left;   CARDIAC CATHETERIZATION N/A 06/10/2016   Procedure: Coronary Stent Intervention;  Surgeon: Cara JONETTA Lovelace, MD;  Location: [facility] INVASIVE CV LAB;  Service: Cardiovascular;  Laterality: N/A;   CHOLECYSTECTOMY N/A 02/17/2022   Procedure: LAPAROSCOPIC CHOLECYSTECTOMY;  Surgeon: Stevie Herlene Righter, MD;  Location: [facility] OR;  Service: General;  Laterality: N/A;   COLON SURGERY     COLONOSCOPY     COLONOSCOPY WITH PROPOFOL  N/A 01/05/2017   Procedure: COLONOSCOPY WITH PROPOFOL ;  Surgeon: Therisa Bi, MD;  Location: [facility] ENDOSCOPY;  Service: Endoscopy;  Laterality: N/A;   COLONOSCOPY WITH PROPOFOL  N/A 02/13/2020   Procedure: COLONOSCOPY WITH PROPOFOL ;  Surgeon: Janalyn Keene NOVAK, MD;  Location: [facility] ENDOSCOPY;  Service: Endoscopy;  Laterality: N/A;   CORONARY ANGIOPLASTY WITH STENT PLACEMENT     CORONARY PRESSURE/FFR STUDY N/A 12/04/2019   Procedure: INTRAVASCULAR PRESSURE WIRE/FFR STUDY;  Surgeon: Wonda Sharper, MD;  Location: [facility] INVASIVE CV LAB;  Service: Cardiovascular;  Laterality: N/A;   ECMO CANNULATION N/A 05/22/2024   Procedure: ECMO CANNULATION;  Surgeon: Cherrie Toribio SAUNDERS, MD;  Location: [facility] INVASIVE CV LAB;  Service: Cardiovascular;  Laterality: N/A;   ESOPHAGOGASTRODUODENOSCOPY (EGD) WITH PROPOFOL  N/A 01/05/2017   Procedure: ESOPHAGOGASTRODUODENOSCOPY (EGD) WITH PROPOFOL ;  Surgeon: Therisa Bi, MD;  Location: [facility] ENDOSCOPY;  Service: Endoscopy;  Laterality: N/A;   ESOPHAGOGASTRODUODENOSCOPY (EGD) WITH PROPOFOL  N/A 02/13/2020   Procedure: ESOPHAGOGASTRODUODENOSCOPY (EGD) WITH PROPOFOL ;  Surgeon: Janalyn Keene NOVAK, MD;  Location: [facility] ENDOSCOPY;   Service: Endoscopy;  Laterality: N/A;   INTERCOSTAL NERVE BLOCK  05/09/2024   Procedure: BLOCK, NERVE, INTERCOSTAL;  Surgeon: Kerrin Elspeth BROCKS, MD;  Location: [facility] OR;  Service: Thoracic;;   KNEE ARTHROSCOPY WITH MEDIAL MENISECTOMY Right 09/05/2017   Procedure: KNEE ARTHROSCOPY WITH MEDIAL AND LATERAL  MENISECTOMY PARTIAL SYNOVECTOMY;  Surgeon: Kathlynn Sharper, MD;  Location: [facility] ORS;  Service: Orthopedics;  Laterality: Right;   LEFT HEART CATH AND CORONARY ANGIOGRAPHY N/A 12/04/2019   Procedure: LEFT HEART CATH AND CORONARY ANGIOGRAPHY;  Surgeon: Wonda Sharper, MD;  Location: [facility] INVASIVE CV LAB;  Service: Cardiovascular;  Laterality: N/A;   LEFT HEART CATH AND CORONARY ANGIOGRAPHY Left 11/25/2022   Procedure: LEFT HEART CATH AND CORONARY ANGIOGRAPHY;  Surgeon: Bathe Denyse DELENA, MD;  Location: [facility] INVASIVE CV LAB;  Service: Cardiovascular;  Laterality: Left;   LOBECTOMY, LUNG, ROBOT-ASSISTED, USING VATS Right 05/09/2024   Procedure: LOBECTOMY, LUNG RIGHT UPPER LOBE, ROBOT-ASSISTED, USING VATS;  Surgeon: Kerrin Elspeth BROCKS, MD;  Location: [facility] OR;  Service: Thoracic;  Laterality: Right;   LYMPH NODE BIOPSY  05/09/2024   Procedure: LYMPH NODE BIOPSY;  Surgeon: Kerrin Elspeth BROCKS, MD;  Location: [facility] OR;  Service: Thoracic;;   REVERSE SHOULDER ARTHROPLASTY Right 08/31/2023   Procedure: RIGHT REVERSE SHOULDER ARTHROPLASTY;  Surgeon: [name], MD;  Location: [facility] OR;  Service: Orthopedics;  Laterality: Right;   SHOULDER SURGERY Right 04/09/2012   SPINE SURGERY     TOTAL KNEE ARTHROPLASTY Right 01/19/2021   Procedure: TOTAL KNEE ARTHROPLASTY - [place] Amber to Assist;  Surgeon: Kathlynn Sharper, MD;  Location: [facility] ORS;  Service: Orthopedics;  Laterality: Right;   WEDGE RESECTION, LUNG, ROBOT-ASSISTED, THORACOSCOPIC Right 05/09/2024   Procedure: WEDGE RESECTION, LUNG RIGHT UPPER LOBE, ROBOT-ASSISTED, THORACOSCOPIC;  Surgeon: Kerrin Elspeth BROCKS, MD;  Location: [facility] OR;  Service: Thoracic;   Laterality: Right;     Social History:   reports that he quit smoking about 25 years ago. His smoking use included cigarettes. He started smoking about 45 years ago. He has a 63.9 pack-year smoking history. He has been exposed to tobacco smoke. He has never used smokeless tobacco. He reports that he does not currently use alcohol  after a past usage of about 3.0 standard drinks of alcohol  per week. He reports that he does not currently use drugs after having used the following drugs: Cocaine and Marijuana.   Family History:  His family history includes Depression in his mother; Early death in his father; Heart attack in his father; Heart disease in his father and mother; Hypertension in his father, mother, and sister; Osteoarthritis in his mother; Parkinson's disease in his maternal grandfather. There is no history of Prostate cancer, Bladder Cancer, Kidney cancer, or Tremor.   Allergies Allergies  Allergen Reactions   Asenapine Other (See Comments) and Nausea And Vomiting    Increased tremors   Dextromethorphan Hbr    Guaifenesin     Latuda [Lurasidone Hcl] Other (See Comments)    Tremors     Lurasidone Other (See Comments) and Hives    Other Reaction(s): Angioedema   Phenylephrine       Home Medications  Prior to Admission medications   Medication Sig Start Date End Date Taking? Authorizing Provider  acetaminophen  (TYLENOL ) 325 MG tablet Take 1 tablet (325 mg total) by mouth every 6 (six) hours as needed for mild pain (pain score 1-3) (or temp > 100.5). 09/01/23  Yes [name], MD  albuterol  (VENTOLIN  HFA) 108 [phone] Base) MCG/ACT inhaler Inhale 2 puffs into the lungs every 6 (six) hours as needed for wheezing or shortness of breath. 12/19/23  Yes Lorren Greig PARAS, NP  aspirin  EC 81 MG tablet Take 1 tablet (81 mg total) by mouth daily. Swallow whole. 02/03/23  Yes Evelena, Nadir, MD  atorvastatin  (LIPITOR ) 80 MG tablet TAKE 1 TABLET BY MOUTH EVERY DAY 01/15/24  Yes [name], MD   budesonide -glycopyrrolate -formoterol  (BREZTRI  AEROSPHERE) 160-9-4.8 MCG/ACT AERO inhaler Inhale 2 puffs into the lungs in the morning and at bedtime. 12/19/23  Yes Lorren, Amy J, NP  celecoxib  (CELEBREX ) 100 MG capsule TAKE 2 CAPSULES BY MOUTH TWICE A DAY 04/25/24  Yes Georgina Ozell LABOR, MD  divalproex  (DEPAKOTE ) 500 MG DR tablet Take half tablet in the morning and 3 tablets at bedtime Patient taking differently: Take half tablet in the morning, 1 in the afternoon, and 2 tablets at bedtime 04/25/24  Yes Izella Ismael NOVAK, MD  DULoxetine  (CYMBALTA ) 30 MG capsule Take 3 capsules (90 mg total) by mouth daily. Patient taking differently: Take 30 mg by mouth 3 (three) times daily. 04/25/24 06/24/24 Yes Izella Ismael NOVAK, MD  gabapentin  (NEURONTIN ) 600 MG tablet Take 1 tablet (600 mg total) by mouth daily AND 2 tablets (1,200 mg total) at bedtime. 04/25/24  Yes Izella Ismael NOVAK, MD  hydrOXYzine  (ATARAX ) 25 MG tablet Take 1 tablet (25 mg total) by mouth 2 (two) times daily as needed. Patient taking differently: Take 25 mg by mouth daily. 04/25/24  Yes Izella Ismael NOVAK, MD  isosorbide  mononitrate (IMDUR ) 30 MG 24 hr tablet TAKE 1 TABLET BY MOUTH 2 TIMES DAILY. 04/09/24  Yes Fernand Denyse LABOR, MD  Multiple Vitamins-Minerals (MENS 50+ MULTIVITAMIN) TABS Take 1 tablet by mouth in the morning.   Yes [provider]  nicotine  polacrilex (NICOTINE  MINI) 2 MG lozenge Take  1 lozenge (2 mg total) by mouth every 2 (two) hours as needed for smoking cessation. 03/26/24 06/24/24 Yes Dgayli, Belva, MD  nitroGLYCERIN  (NITROSTAT ) 0.4 MG SL tablet Place 1 tablet (0.4 mg total) under the tongue every 5 (five) minutes as needed for chest pain. 02/03/23 05/06/24 Yes Evelena Figures, MD  pantoprazole  (PROTONIX ) 40 MG tablet Take 1 tablet (40 mg total) by mouth daily. 12/19/23  Yes Lorren, Amy J, NP  QUEtiapine  (SEROQUEL ) 200 MG tablet Take 1 tablet (200 mg total) by mouth at bedtime. 04/25/24  Yes Izella Ismael NOVAK, MD  traZODone   (DESYREL ) 100 MG tablet Take 0.5 tablets (50 mg total) by mouth at bedtime for 7 days. Patient not taking: Reported on 05/06/2024 04/25/24 05/02/24  Izella Ismael NOVAK, MD          My critical care time: 40 minutes   05/25/2024, 10:38 AM  Tamela Stakes, MD  Attending Physician, Critical Care Medicine [place] [facility] See Amion for pager If no response to pager, please call [phone] until 7pm After 7pm, Please call E-link [phone]

## 2024-05-25 NOTE — Progress Notes (Signed)
 3 Days Post-Op Procedure(s) (LRB): ECMO CANNULATION (N/A) Subjective:  Hemodynamically stable on VV ECMO CVP 3  Sedated and paralyzed on vent. 60% FiO2  -129 cc for 24 hrs.    Objective: Vital signs in last 24 hours: Temp:  [98.2 F (36.8 C)-98.8 F (37.1 C)] 98.6 F (37 C) (11/01 1330) Pulse Rate:  [63-108] 73 (11/01 1330) Cardiac Rhythm: Normal sinus rhythm (11/01 0800) Resp:  [10-81] 74 (11/01 1330) BP: (96-126)/(62-70) 101/62 (11/01 0800) SpO2:  [83 %-100 %] 95 % (11/01 1330) Arterial Line BP: (104-174)/(39-59) 130/51 (11/01 1330) FiO2 (%):  [60 %] 60 % (11/01 1020) Weight:  [107 kg] 107 kg (11/01 0700)  Hemodynamic parameters for last 24 hours: CVP:  [1 mmHg-23 mmHg] 3 mmHg  Intake/Output from previous day: 10/31 0701 - 11/01 0700 In: 3530.9 [I.V.:1429.8; NG/GT:1293; IV Piggyback:808.1] Out: 3660 [Urine:3060; Stool:600] Intake/Output this shift: Total I/O In: 813.7 [I.V.:385.7; NG/GT:428] Out: 585 [Urine:585]  General appearance: sedated on vent Neurologic: paralyzed and sedated Heart: regular rate and rhythm Lungs: distant BS bilat but TV only 100 cc Abdomen: soft, bowel sounds present Extremities: edema mild Wound: dressing dry No air leak from chest tube.  Lab Results: Recent Labs    05/24/24 1700 05/24/24 1931 05/25/24 0411 05/25/24 0417 05/25/24 1235 05/25/24 1327  WBC 20.0*  --  21.0*  --   --   --   HGB 9.0*   < > 9.0*   < > 8.8* 8.5*  HCT 28.8*   < > 29.6*   < > 26.0* 25.0*  PLT 268  --  285  --   --   --    < > = values in this interval not displayed.   BMET:  Recent Labs    05/24/24 1700 05/24/24 1931 05/25/24 0411 05/25/24 0417 05/25/24 1235 05/25/24 1327  NA 149*   < > 148*   < > 149* 150*  K 4.7   < > 4.9   < > 4.4 4.5  CL 110  --  111  --   --   --   CO2 28  --  29  --   --   --   GLUCOSE 125*  --  124*  --   --   --   BUN 48*  --  48*  --   --   --   CREATININE 0.77  --  0.85  --   --   --   CALCIUM  8.3*  --  8.2*  --    --   --    < > = values in this interval not displayed.    PT/INR:  Recent Labs    05/25/24 0411  LABPROT 20.5*  INR 1.7*   ABG    Component Value Date/Time   PHART 7.376 05/25/2024 1327   HCO3 28.6 (H) 05/25/2024 1327   TCO2 30 05/25/2024 1327   ACIDBASEDEF 1.0 05/19/2024 1154   O2SAT 90 05/25/2024 1327   CBG (last 3)  Recent Labs    05/24/24 2223 05/25/24 0415 05/25/24 0846  GLUCAP 180* 131* 116*   CXR: increased size of right pleural space with increased atelectasis of RML/RLL.  Assessment/Plan:  Continuing support on VV ECMO for acute hypoxemic, hypercapnic respiratory failure. CCM and AHF team managing ECMO/Vent.  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 Pseudomonas from BAL. On Merrem and micafungin.  Acute metabolic encephalopathy: keeping sedated and paralyzed to keep stable on ECMO and vent.  LOS: 16  days    Dorise MARLA Fellers 05/25/2024

## 2024-05-25 NOTE — Death Summary Note (Addendum)
 [address] [place] [address]             [phone]    Physician Discharge Summary  Patient ID: Keith Gibbs MRN: [id number] DOB/AGE: [birth year] 63 y.o.  Admit date: 05/09/2024 Discharge date: 06/05/2024  Admission Diagnoses:  Patient Active Problem List   Diagnosis Date Noted   Status post robot-assisted surgical procedure 05/09/2024   AKI (acute kidney injury) 03/22/2024   Symptomatic bradycardia 03/20/2024   Shoulder arthritis 08/31/2023   Lumbar radiculopathy 05/30/2023   Major depressive disorder, recurrent severe without psychotic features (HCC) 01/28/2023   S/P lumbar fusion 08/23/2022   Postlaminectomy syndrome, not elsewhere classified 08/12/2022   Substance induced mood disorder (HCC) 08/06/2022   Suicide attempt by drug overdose (HCC) 08/06/2022   Toxic encephalopathy 08/03/2022   Suicide ideation 08/03/2022   Encephalopathy 08/03/2022   Polysubstance abuse (HCC) 04/28/2022   Postoperative abdominal pain 02/28/2022   Acute cholecystitis 02/17/2022   Obstruction of nasal valve 01/07/2022   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   Pulmonary nodule 12/17/2021   CAD S/P percutaneous coronary angioplasty 12/17/2021   Epigastric abdominal pain    Nausea and vomiting    Tobacco dependence due to cigarettes 08/31/2021   Iron deficiency anemia secondary to inadequate dietary iron intake 08/31/2021   Acute lead-induced gout involving toe of left foot 08/31/2021   Encounter for lipid screening for cardiovascular disease 08/31/2021   Hypotension due to drugs 06/21/2021   Opioid withdrawal (HCC) 06/21/2021   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HCC) 06/21/2021   RLS (restless legs syndrome) 05/07/2021   Acquired deviated nasal septum 05/03/2021   Encounter for surveillance of abnormal nevi 05/03/2021   Flu vaccine need 05/03/2021   Substance abuse (HCC) 05/03/2021   Drug abuse and dependence (HCC) 05/03/2021   Need for shingles vaccine 05/03/2021   Right groin  pain 01/29/2021   S/P TKR (total knee replacement) using cement, right 01/19/2021   Cigarette smoker 07/21/2020   Foot pain, bilateral 12/25/2019   Potential exposure to STD 12/12/2019   History of lumbar surgery 12/12/2019   Fatigue 12/12/2019   Penile lesion 12/12/2019   Right testicular pain 12/12/2019   Body mass index 28.0-28.9, adult 12/12/2019   History of COPD 12/12/2019   Tobacco use disorder, continuous 12/12/2019   Hepatoma (HCC) 11/05/2019   Anxiety 12/10/2018   Essential hypertension 09/18/2018   Hypercholesteremia 09/18/2018   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   Chronic anticoagulation (Plavix ) 04/19/2018   Pain medication agreement broken ([facility]) 04/19/2018   Cocaine use 04/18/2018   Cocaine abuse (HCC) (See 04/12/18 UDS) 04/18/2018   Marijuana abuse (See 04/12/18 UDS) 04/18/2018   Long term current use of opiate analgesic 04/12/2018   Cirrhosis of liver (HCC) 04/03/2018   BPH (benign prostatic hyperplasia) 04/03/2018   Hematuria, microscopic 04/03/2018   Osteoarthritis of the knee (Left) 11/02/2017   Lumbar facet syndrome (Bilateral) 11/01/2017   Spondylosis without myelopathy or radiculopathy, lumbosacral region 11/01/2017   Osteoarthritis of shoulder (Right) 11/01/2017   Osteoarthritis of acromioclavicular joint (Right) 11/01/2017   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) 11/01/2017   Osteoarthritis 11/01/2017   Cervicalgia 11/01/2017   Cervical facet syndrome 11/01/2017   DDD (degenerative disc disease), cervical 11/01/2017   DDD (degenerative disc disease), lumbar 11/01/2017   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) 09/18/2017   Other chronic pain 09/18/2017   Chronic musculoskeletal pain 09/18/2017   Chronic pain of right knee 09/11/2017   Chronic  low back pain (Secondary Area of Pain) (Bilateral) 09/11/2017   Chronic hip pain [facility] Area of Pain) (Bilateral) (R>L) 09/11/2017   Chronic shoulder pain (Fourth Area of Pain) (Right)  09/11/2017   Spondylosis without myelopathy or radiculopathy, cervical region 09/11/2017   Chronic pain syndrome 09/11/2017   Opiate use 09/11/2017   Screen for STD (sexually transmitted disease) 09/11/2017   Disorder of skeletal system 09/11/2017   Problems influencing health status 09/11/2017   Overdose of medication, intentional self-harm, sequela 12/28/2016   Severe recurrent major depression without psychotic features (HCC) 12/27/2016   Alcohol  use disorder, moderate, dependence (HCC) 12/27/2016   Cannabis use disorder, moderate, dependence (HCC) 12/27/2016   Bipolar 1 disorder (HCC) 05/27/2013   Myalgia 05/27/2013   Abnormal ejaculation 11/30/2012   Chest pain, unspecified 11/30/2012   Degenerative arthritis of hip 11/16/2012   Schizoaffective disorder (HCC) 06/27/2012   COPD with acute exacerbation (HCC) 06/27/2012   Chronic hepatitis C (HCC) 06/27/2012   GERD (gastroesophageal reflux disease) 06/27/2012   Complete rupture of rotator cuff 02/10/2012   Cocaine abuse in remission (HCC) 12/05/2011   Unspecified osteoarthritis, unspecified site 07/27/2011   Discharge Diagnoses:   Patient Active Problem List   Diagnosis Date Noted   Pleural effusion 06-19-24   Pressure injury of skin 2024-06-19   Protein-calorie malnutrition, severe 06/03/2024   Candidemia (HCC) 05/23/2024   Central line infection 05/23/2024   Pseudomonas aeruginosa infection 05/23/2024   Ventilator associated pneumonia (HCC) 05/23/2024   Patient receiving extracorporeal membrane oxygenation (ECMO) 05/23/2024   Acute respiratory distress syndrome (ARDS) due to COVID-19 virus (HCC) 05/22/2024   Malnutrition of moderate degree 05/20/2024   Anxiety disorder due to medical condition 05/15/2024   Acute hypoxic respiratory failure (HCC) 05/14/2024   S/P lobectomy of lung 05/09/2024   Status post robot-assisted surgical procedure 05/09/2024   AKI (acute kidney injury) 03/22/2024   Symptomatic bradycardia  03/20/2024   Shoulder arthritis 08/31/2023   Lumbar radiculopathy 05/30/2023   Major depressive disorder, recurrent severe without psychotic features (HCC) 01/28/2023   S/P lumbar fusion 08/23/2022   Postlaminectomy syndrome, not elsewhere classified 08/12/2022   Substance induced mood disorder (HCC) 08/06/2022   Suicide attempt by drug overdose (HCC) 08/06/2022   Toxic encephalopathy 08/03/2022   Suicide ideation 08/03/2022   Encephalopathy 08/03/2022   Polysubstance abuse (HCC) 04/28/2022   Postoperative abdominal pain 02/28/2022   Acute cholecystitis 02/17/2022   Obstruction of nasal valve 01/07/2022   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   Pulmonary nodule 12/17/2021   CAD S/P percutaneous coronary angioplasty 12/17/2021   Epigastric abdominal pain    Nausea and vomiting    Tobacco dependence due to cigarettes 08/31/2021   Iron deficiency anemia secondary to inadequate dietary iron intake 08/31/2021   Acute lead-induced gout involving toe of left foot 08/31/2021   Encounter for lipid screening for cardiovascular disease 08/31/2021   Hypotension due to drugs 06/21/2021   Opioid withdrawal (HCC) 06/21/2021   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HCC) 06/21/2021   RLS (restless legs syndrome) 05/07/2021   Acquired deviated nasal septum 05/03/2021   Encounter for surveillance of abnormal nevi 05/03/2021   Flu vaccine need 05/03/2021   Substance abuse (HCC) 05/03/2021   Drug abuse and dependence (HCC) 05/03/2021   Need for shingles vaccine 05/03/2021   Right groin pain 01/29/2021   S/P TKR (total knee replacement) using cement, right 01/19/2021   Cigarette smoker 07/21/2020   Foot pain, bilateral 12/25/2019   Potential exposure to STD 12/12/2019   History of lumbar surgery 12/12/2019  Fatigue 12/12/2019   Penile lesion 12/12/2019   Right testicular pain 12/12/2019   Body mass index 28.0-28.9, adult 12/12/2019   History of COPD 12/12/2019   Tobacco use disorder, continuous 12/12/2019   Hepatoma (HCC)  11/05/2019   Anxiety 12/10/2018   Essential hypertension 09/18/2018   Hypercholesteremia 09/18/2018   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   Chronic anticoagulation (Plavix ) 04/19/2018   Pain medication agreement broken ([facility]) 04/19/2018   Cocaine use 04/18/2018   Cocaine abuse (HCC) (See 04/12/18 UDS) 04/18/2018   Marijuana abuse (See 04/12/18 UDS) 04/18/2018   Long term current use of opiate analgesic 04/12/2018   Cirrhosis of liver (HCC) 04/03/2018   BPH (benign prostatic hyperplasia) 04/03/2018   Hematuria, microscopic 04/03/2018   Osteoarthritis of the knee (Left) 11/02/2017   Lumbar facet syndrome (Bilateral) 11/01/2017   Spondylosis without myelopathy or radiculopathy, lumbosacral region 11/01/2017   Osteoarthritis of shoulder (Right) 11/01/2017   Osteoarthritis of acromioclavicular joint (Right) 11/01/2017   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) 11/01/2017   Osteoarthritis 11/01/2017   Cervicalgia 11/01/2017   Cervical facet syndrome 11/01/2017   DDD (degenerative disc disease), cervical 11/01/2017   DDD (degenerative disc disease), lumbar 11/01/2017   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) 09/18/2017   Other chronic pain 09/18/2017   Chronic musculoskeletal pain 09/18/2017   Chronic pain of right knee 09/11/2017   Chronic low back pain (Secondary Area of Pain) (Bilateral) 09/11/2017   Chronic hip pain (Tertiary Area of Pain) (Bilateral) (R>L) 09/11/2017   Chronic shoulder pain (Fourth Area of Pain) (Right) 09/11/2017   Spondylosis without myelopathy or radiculopathy, cervical region 09/11/2017   Chronic pain syndrome 09/11/2017   Opiate use 09/11/2017   Screen for STD (sexually transmitted disease) 09/11/2017   Disorder of skeletal system 09/11/2017   Problems influencing health status 09/11/2017   Overdose of medication, intentional self-harm, sequela 12/28/2016   Severe recurrent major depression without psychotic features (HCC) 12/27/2016    Alcohol  use disorder, moderate, dependence (HCC) 12/27/2016   Cannabis use disorder, moderate, dependence (HCC) 12/27/2016   Bipolar 1 disorder (HCC) 05/27/2013   Myalgia 05/27/2013   Abnormal ejaculation 11/30/2012   Chest pain, unspecified 11/30/2012   Degenerative arthritis of hip 11/16/2012   Schizoaffective disorder (HCC) 06/27/2012   COPD with acute exacerbation (HCC) 06/27/2012   Chronic hepatitis C (HCC) 06/27/2012   GERD (gastroesophageal reflux disease) 06/27/2012   Complete rupture of rotator cuff 02/10/2012   Cocaine abuse in remission (HCC) 12/05/2011   Unspecified osteoarthritis, unspecified site 07/27/2011   Discharged Condition: deceased  History of Present Illness:  Keith Gibbs is a 63 year old man with past medical history significant for bipolar 1 disorder, schizophrenia, polysubstance abuse, CAD, mild to moderate aortic insufficiency, hypertension, hyperlipidemia, MI, hepatitis C, numerous orthopedic and spine surgeries, chronic pain, tobacco abuse, and COPD.  He smoked about a half of cigarettes for 45 years prior to quitting on 04/17/2024.   He was admitted to the hospital in August with a COPD exacerbation.  Had presented with chest heaviness but cardiac causes were ruled out.  Treated with antibiotics and steroids.  He had a CT which showed an enlarging right upper lobe nodule.  He saw Dr. Isadora.  A PET/CT showed the nodule is hypermetabolic.  There was no evidence of regional or distant metastatic disease.  There was a left level 2 lymph node in the neck that was mildly hypermetabolic.   As noted he quit smoking about 2 weeks ago.  Still has coughing and wheezing.  Activities are largely limited by pain and shortness of breath.  Not having any consistent anginal type pain with activity.  Was told by cardiology that his heart was strong enough for surgery.  It was recommended he undergo Robotic Assisted Thoracoscopy with wedge resection vs possible lobectomy.  The  risks and benefits of the procedure were explained to the patient and he was agreeable to proceed.    Hospital Course:  Keith Gibbs presented to [facility] on 05/09/2024.  He was taken to the operating room and underwent Robotic Assisted Right Video Thoracoscopy with Right Upper Lobectomy, Lymph Node Dissection, and intercostal nerve block.  He tolerated the procedure without difficulty, was extubated, and taken to the PACU in stable condition.  The patient had issues with pain control post operatively.  He has long standing chronic pain and his medications were adjusted to achieve adequate control.  The patient's chest xray showed a moderate pneumothorax on the right side.  His chest tube exhibited a large air leak and was kept in place.  His central line and foley catheter was removed without difficulty.  His home medications were resumed.  On postop day 3 his chest x-ray showed his pneumothorax to be more enlarged compared to previous film with a ongoing large airleak he was placed back to suction.  He does have an expected acute blood loss anemia which is stabilized and is showing an improving trend.  Is maintaining normal renal.  He is voiding without difficulty.  The patient's chest xray showed some improvement of right apical pneumothorax.  His large air leak persisted.  The patient developed respiratory issues throughout yesterday and into the evening.  He developed desaturation with ARDS type picture.  He was placed on HFNC and ultimately transferred to the SICU for closer monitoring.  Critical care consult was obtained and the patient was treated with steroids and IV diuretics.  Echocardiogram was performed and showed normal LV function.  He continued to require increase oxygen demand.  Chest xray remained stable and large air leak persisted.  He remained marginally hypoxic with a 100% nonrebreather in addition to the 100% heated high flow nasal cannula oxygen.  BiPAP was attempted to  improve oxygenation but he became increasingly agitated.  Decision was made on 05/19/2024 to reintubate and initiate mechanical ventilation.  His antibiotics were broadened over the weekend for sepsis like physiology.  He required pressor support with Levophed and Vasopressin , which were weaned as hemodynamics allowed.  He was started on tube feeding to help with nutrition.  Cortrak placement was recommended and was accomplished at the bedside bu the registered dietician on 10/2 .  Venous duplex was ordered and revealed a thrombus in the gastrocnemius veins for which he was treated with Heparin .  Keith Gibbs's respiratory status remained tenuous.  The advanced heart failure team was consulted and the decision was made to proceed with VV ECMO.  There was significant improvement in the blood gas as a result allowing a decrease in the ventilator tidal volume.  The air leak from the chest tube decreased as a result. Supportive care continued.  Suspected HCAP was treated with meropenem, vancomycin , and micafungin.  However respiratory and blood cultures showed candida species and Vancomycin  was discontinued.  ID consult was obtained to help guide antibiotic therapy.  They were in agreement with continued Micafungin and Meropenem. ECMO flow was increased to maintain oxygenation. Bivalirudin  was held and tracheostomy creation was performed without complication on 05/27/24. The patient developed seizure like  activity when weaned off ketamine  as well as worsening bradycardia and drastic blood pressure changes. CT head 11/05 showed no acute intracranial abnormality but chronic encephalomalacia changes in the right occipital lobe. Diuresis was started for worsening volume status per AHF team. The patient underwent circuit exchange due to air entrainment and desaturated into the 30s on 11/08. This quickly resolved and hemodynamics stabilized. Right sided chest tube remained in place with right apical space on CXR. There was no  air leak. He remained on Levo and Vaso for hemodynamic support. Sedation was weaned and he started intermittently following commands. Levo and vaso was weaned off and he became hypertensive. Cleviprex was started, Oral hydralazine  was started and Cleviprex was weaned as tolerated. He underwent bronchoscopy Jun 10, 2024 for worsening airspace disease that was complicated by hypotension. Norepinephrine was restarted.  The patient developed worsening oxygenation and he underwent TEE which showed the cannulation to be in good position.  He required placement of bilateral pigtail catheters for pleural effusions.  There was a multidisciplinary care meeting held to update family of the patient's deteriorating status.  They did not want the patient to suffer any longer and opted escalate patient's care to comfort care measures.  This was initiated at 1830 with death occurring at 36.  Consults: pulmonary/intensive care and nutrition  Significant Diagnostic Studies: nuclear medicine:   CLINICAL DATA:  Initial treatment strategy for pulmonary nodule.   EXAM: NUCLEAR MEDICINE PET SKULL BASE TO THIGH   TECHNIQUE: 11.7 mCi F-18 FDG was injected intravenously. Full-ring PET imaging was performed from the skull base to thigh after the radiotracer. CT data was obtained and used for attenuation correction and anatomic localization.   Fasting blood glucose: 104 mg/dl   COMPARISON:  CT chest 02/29/2024 and 11/28/2023.   FINDINGS: Mediastinal blood pool activity: SUV max 2.4   Liver activity: SUV max NA   NECK:   Hypermetabolic left level II lymph node measures 8 mm (6/22), SUV max 4.7.   Incidental CT findings:   None.   CHEST:   Spiculated subpleural anterolateral right upper lobe nodule measures approximately 0.6 x 1.4 cm (6/50), SUV max 7.6. No additional abnormal hypermetabolism.   Incidental CT findings:   Atherosclerotic calcification of the aorta and coronary arteries. Heart is enlarged. No  pericardial effusion. Centrilobular and paraseptal emphysema.   ABDOMEN/PELVIS:   No abnormal hypermetabolism.   Incidental CT findings:   Small right hepatic lobe cyst. Cholecystectomy. There may be a small low-attenuation lesion in the lower pole right kidney. No specific follow-up necessary.   SKELETON:   No abnormal hypermetabolism.   Incidental CT findings:   Postoperative changes in the lumbar spine. Degenerative changes in the spine.   IMPRESSION: 1. 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 2. Hypermetabolic small left level II lymph node is nonspecific. Recommend attention on follow-up. 3. Aortic atherosclerosis (ICD10-I70.0). Coronary artery calcification. 4.  Emphysema (ICD10-J43.9).    Electronically Signed   By: Newell Eke M.D.   On: 04/02/2024 13:58  Treatments: surgery:   NAME: Keith Gibbs, Keith Gibbs MEDICAL RECORD NO: [id number] ACCOUNT NO: [id number] DATE OF BIRTH: 1960/12/29 FACILITY: [facility] LOCATION: [id number] PHYSICIAN: Elspeth BROCKS. Kerrin, MD   Operative Report    DATE OF PROCEDURE: 05/09/2024   PREOPERATIVE DIAGNOSIS:  Right upper lobe lung nodule, suspect stage IA non-small cell carcinoma.   POSTOPERATIVE DIAGNOSIS:  Adenocarcinoma right upper lobe, clinical stage IA (T1, N0).   PROCEDURE:  Xi robotic-assisted right thoracoscopy, right upper  lobe wedge resection, right upper lobectomy, lymph node dissection, intercostal nerve blocks levels 3 through 10.   SURGEON:  Elspeth BROCKS. Kerrin, MD   ASSISTANT:  Rocky Shad, PA and Laurel Becket, [place].  PATHOLOGY:  FINAL MICROSCOPIC DIAGNOSIS:   A. LUNG, RIGHT UPPER LOBE, WEDGE RESECTION:       Fibrous and hyalinized nodule, negative for malignancy.   B. LYMPH NODE, LEVEL 11 #2, EXCISION:       One lymph node, negative for metastatic carcinoma (0/1).   C. LUNG, RIGHT UPPER LOBE #2, WEDGE RESECTION:       Adenocarcinoma,  acinar pattern, moderately to poorly  differentiated.      Tumor size: 1.5 cm.       Tumor invades visceral pleura.       No lymphovascular invasion identified.       See part N for margin status.  See oncology table.   D. LYMPH NODE, LEVEL 9, EXCISION:       One lymph node, negative for metastatic carcinoma (0/1).   E. LYMPH NODE, LEVEL 7, EXCISION:       One lymph node, negative for metastatic carcinoma (0/1).   F. LYMPH NODE, LEVEL 7 #2, EXCISION:       One lymph node, negative for metastatic carcinoma (0/1).   G. LYMPH NODE, LEVEL 7 #3, EXCISION:       One lymph node, negative for metastatic carcinoma (0/1).   H. LYMPH NODE, LEVEL 11, EXCISION:       One lymph node, negative for metastatic carcinoma (0/1).   I. LYMPH NODE, LEVEL 12, EXCISION:       One lymph node, negative for metastatic carcinoma (0/1).   J. LYMPH NODE, LEVEL 4R, EXCISION:       One lymph node, negative for metastatic carcinoma (0/1).   K. LYMPH NODE, LEVEL 4R #2, EXCISION:       One lymph node, negative for metastatic carcinoma (0/1).   L. LYMPH NODE, LEVEL 10, EXCISION:       One lymph node, negative for metastatic carcinoma (0/1).   M. LYMPH NODE, LEVEL 10 #2, EXCISION:       One lymph node, negative for metastatic carcinoma (0/1).   N. LUNG, RIGHT UPPER LOBE, LOBECTOMY:       Benign pulmonary parenchyma, negative for malignancy.       Bronchial margin and vascular margin are negative for carcinoma.       One lymph node, negative for metastatic carcinoma (0/1).   Disposition: Deceased  Signed: Rocky Shad, PA-C  06/05/2024, 9:44 AM

## 2024-05-25 NOTE — Progress Notes (Signed)
 Wasted 50 mls of versed , 75 mls of ketamine , and 20 mls of dilaudid  in stericycle with Saddie Cleaves RN.

## 2024-05-25 NOTE — Progress Notes (Signed)
 PHARMACY - ANTICOAGULATION CONSULT NOTE  Pharmacy Consult for heparin  >> bivalirudin  Indication: VTE, VV ECMO (10/29 - current)  Allergies  Allergen Reactions   Asenapine Other (See Comments) and Nausea And Vomiting    Increased tremors   Dextromethorphan Hbr    Guaifenesin     Latuda [Lurasidone Hcl] Other (See Comments)    Tremors     Lurasidone Other (See Comments) and Hives    Other Reaction(s): Angioedema   Phenylephrine      Patient Measurements: Height: 6' 3 (190.5 cm) Weight: 107 kg (235 lb 14.3 oz) IBW/kg (Calculated) : 84.5  Vital Signs: Temp: 98.4 F (36.9 C) (11/01 1045) BP: 101/62 (11/01 0800) Pulse Rate: 78 (11/01 1045)  Labs: Recent Labs    05/22/24 1710 05/22/24 1845 05/22/24 1904 05/23/24 0300 05/23/24 0445 05/23/24 0453 05/24/24 0401 05/24/24 0408 05/24/24 1700 05/24/24 1931 05/24/24 2200 05/25/24 0411 05/25/24 0417 05/25/24 0842  HGB 9.4*  --    < >  --  8.9*   < > 9.1*   < > 9.0*   < >  --  9.0* 8.8* 8.2*  HCT 30.8*  --    < >  --  28.0*   < > 29.2*   < > 28.8*   < >  --  29.6* 26.0* 24.0*  PLT 201  --    < >  --  263   < > 248  --  268  --   --  285  --   --   APTT >200*  --    < > 58*  --    < > 45*   < > 52*  --  63* 63*  --   --   LABPROT 16.8*  --   --   --  15.3*  --  17.0*  --   --   --   --  20.5*  --   --   INR 1.3*  --   --   --  1.1  --  1.3*  --   --   --   --  1.7*  --   --   HEPARINUNFRC >1.10* >1.10*  --  0.33  --   --   --   --   --   --   --   --   --   --   CREATININE 1.11  --   --   --  0.88   < > 0.84  --  0.77  --   --  0.85  --   --    < > = values in this interval not displayed.    Estimated Creatinine Clearance: 117.6 mL/min (by C-G formula based on SCr of 0.85 mg/dL).   Medical History: Past Medical History:  Diagnosis Date   Anginal pain    Anxiety    Arthritis    Bipolar 1 disorder (HCC)    Bursitis    CAD (coronary artery disease)    Stent placed 2017   Cancer (HCC)    Chronic pain    COPD  (chronic obstructive pulmonary disease) (HCC)    Current use of long term anticoagulation    DAPT (ASA + clopidogrel )   Depression    Diverticulitis    Dyspnea    GERD (gastroesophageal reflux disease)    Grade I diastolic dysfunction    Hepatitis C 2012   No longer has Hep C   HLD (hyperlipidemia)    Hypertension    MI (myocardial infarction) (  HCC)    Polysubstance abuse (HCC)    cocaine, marijuana, ETOH   PUD (peptic ulcer disease)    S/P angioplasty with stent 06/10/2016   a.) 90% stenosis of pLAD to mLAD - 2.5 x 18 mm Xience Alpine (DES x 1) placed to pLAD   S/P PTCA (percutaneous transluminal coronary angioplasty) 12/04/2019   a.) 60% in stent restenosis of DES to pLAD; LVEF 65%.   Schizophrenia (HCC)    Stroke (HCC)    Tremors    generalized   Valvular insufficiency    a.) Mild MR, TR, PR; mild to moderate AR on 03/05/2018 TTE      Assessment: 63 yoM admitted for RUL lobectomy.  Pt intubated, now having difficulty with ventilation. Placed on VV ECMO 10/29 in the evening. Pharmacy to dose IV bivalirudin .   Previously on therapeutic heparin  (HL 0.33 on 1700/h) for acute DVT of gastrocnemius vein   aPTT 63s is subtherapeutic with bivalirudin  running at 0.15 mg/kg/hr. Hgb (8.8) and PLTs (285) are stable. LDH and fibrinogen are stable. Per RN, no report of pauses, issues with the line, or signs of bleeding.   There are clots noted in the 12, 3, and 9 o'clock corners of the filter with some fibrin stranding. This remains stable today.   Goal of Therapy:  aPTT 60-80s Monitor platelets by anticoagulation protocol: Yes   Plan:  Continue bivalirudin  at 0.15 mg/kg/hr  Monitor twice daily aPTT and CBC   Thank you for allowing pharmacy to be a part of this patient's care.   Nidia Schaffer, PharmD PGY2 Cardiology Pharmacy Resident  Please check AMION for all [facility] Pharmacy phone numbers After 10:00 PM, call Main Pharmacy [phone] 05/25/2024  11:04 AM

## 2024-05-25 NOTE — Progress Notes (Signed)
 Peripherally Inserted Central Catheter Placement  The IV Nurse has discussed with the patient and/or persons authorized to consent for the patient, the purpose of this procedure and the potential benefits and risks involved with this procedure.  The benefits include less needle sticks, lab draws from the catheter, and the patient may be discharged home with the catheter. Risks include, but not limited to, infection, bleeding, blood clot (thrombus formation), and puncture of an artery; nerve damage and irregular heartbeat and possibility to perform a PICC exchange if needed/ordered by physician.  Alternatives to this procedure were also discussed.  Bard Power PICC patient education guide, fact sheet on infection prevention and patient information card has been provided to patient /or left at bedside.  Telephone consent from mother.  PICC Placement Documentation  PICC Triple Lumen 05/25/24 Left Brachial 47 cm 0 cm (Active)  Indication for Insertion or Continuance of Line Vasoactive infusions 05/25/24 1128  Exposed Catheter (cm) 0 cm 05/25/24 1128  Site Assessment Clean, Dry, Intact 05/25/24 1128  Lumen #1 Status Saline locked;Flushed;Blood return noted 05/25/24 1128  Lumen #2 Status Flushed;Saline locked;Blood return noted 05/25/24 1128  Lumen #3 Status Flushed;Saline locked;Blood return noted 05/25/24 1128  Dressing Type Transparent;Securing device 05/25/24 1128  Dressing Status Antimicrobial disc/dressing in place;Clean, Dry, Intact 05/25/24 1128  Line Care Connections checked and tightened 05/25/24 1128  Line Adjustment (NICU/IV Team Only) No 05/25/24 1128  Dressing Intervention New dressing;Adhesive placed at insertion site (IV team only);Adhesive placed around edges of dressing (IV team/ICU RN only) 05/25/24 1128  Dressing Change Due 06/01/24 05/25/24 1128       Keith Gibbs 05/25/2024, 11:29 AM

## 2024-05-25 NOTE — CV Procedure (Signed)
 ECMO NOTE:   Indication: Respiratory failure due to ARDS   Initial cannulation date: 05/22/24   ECMO type: VV ECMO - Day #3   Dual lumen inflow/return cannula:   1) 34F [place] placed in RIJ    Daily data:   Flow 4.3L RPM 3300 Sweep  8L Pven -91   Labs:   ABG    Component Value Date/Time   PHART 7.376 05/25/2024 1327   PCO2ART 48.9 (H) 05/25/2024 1327   PO2ART 61 (L) 05/25/2024 1327   HCO3 28.6 (H) 05/25/2024 1327   TCO2 30 05/25/2024 1327   ACIDBASEDEF 1.0 05/19/2024 1154   O2SAT 90 05/25/2024 1327    Hgb 9 Platelets 285 LDH 417 PTT 63 on bival Lactic acid 0.9  Plan:   Continue VV ECMO support Await lung recovery, expect prolonged course Continue Bival, PTT at goal >60 Hold on Sunday night in anticipation of trach Continue abx/antifungals Trach on Monday (Dr. Jesus) Wean paralytics/sedation as able   Morene JINNY Brownie, MD  3:06 PM

## 2024-05-25 DEATH — deceased

## 2024-05-26 ENCOUNTER — Inpatient Hospital Stay (HOSPITAL_COMMUNITY): Payer: MEDICAID

## 2024-05-26 DIAGNOSIS — J9621 Acute and chronic respiratory failure with hypoxia: Secondary | ICD-10-CM | POA: Diagnosis not present

## 2024-05-26 DIAGNOSIS — J9622 Acute and chronic respiratory failure with hypercapnia: Secondary | ICD-10-CM | POA: Diagnosis not present

## 2024-05-26 DIAGNOSIS — Z902 Acquired absence of lung [part of]: Secondary | ICD-10-CM | POA: Diagnosis not present

## 2024-05-26 LAB — HEPATIC FUNCTION PANEL
ALT: 84 U/L — ABNORMAL HIGH (ref 0–44)
AST: 117 U/L — ABNORMAL HIGH (ref 15–41)
Albumin: 1.9 g/dL — ABNORMAL LOW (ref 3.5–5.0)
Alkaline Phosphatase: 111 U/L (ref 38–126)
Bilirubin, Direct: 0.6 mg/dL — ABNORMAL HIGH (ref 0.0–0.2)
Indirect Bilirubin: 0.2 mg/dL — ABNORMAL LOW (ref 0.3–0.9)
Total Bilirubin: 0.8 mg/dL (ref 0.0–1.2)
Total Protein: 5.1 g/dL — ABNORMAL LOW (ref 6.5–8.1)

## 2024-05-26 LAB — POCT I-STAT 7, (LYTES, BLD GAS, ICA,H+H)
Acid-Base Excess: 2 mmol/L (ref 0.0–2.0)
Acid-Base Excess: 2 mmol/L (ref 0.0–2.0)
Acid-Base Excess: 3 mmol/L — ABNORMAL HIGH (ref 0.0–2.0)
Acid-Base Excess: 4 mmol/L — ABNORMAL HIGH (ref 0.0–2.0)
Acid-Base Excess: 4 mmol/L — ABNORMAL HIGH (ref 0.0–2.0)
Acid-Base Excess: 5 mmol/L — ABNORMAL HIGH (ref 0.0–2.0)
Bicarbonate: 28.4 mmol/L — ABNORMAL HIGH (ref 20.0–28.0)
Bicarbonate: 29.2 mmol/L — ABNORMAL HIGH (ref 20.0–28.0)
Bicarbonate: 29.6 mmol/L — ABNORMAL HIGH (ref 20.0–28.0)
Bicarbonate: 29.6 mmol/L — ABNORMAL HIGH (ref 20.0–28.0)
Bicarbonate: 29.7 mmol/L — ABNORMAL HIGH (ref 20.0–28.0)
Bicarbonate: 29.9 mmol/L — ABNORMAL HIGH (ref 20.0–28.0)
Calcium, Ion: 1.23 mmol/L (ref 1.15–1.40)
Calcium, Ion: 1.25 mmol/L (ref 1.15–1.40)
Calcium, Ion: 1.25 mmol/L (ref 1.15–1.40)
Calcium, Ion: 1.25 mmol/L (ref 1.15–1.40)
Calcium, Ion: 1.25 mmol/L (ref 1.15–1.40)
Calcium, Ion: 1.26 mmol/L (ref 1.15–1.40)
HCT: 25 % — ABNORMAL LOW (ref 39.0–52.0)
HCT: 25 % — ABNORMAL LOW (ref 39.0–52.0)
HCT: 27 % — ABNORMAL LOW (ref 39.0–52.0)
HCT: 35 % — ABNORMAL LOW (ref 39.0–52.0)
HCT: 38 % — ABNORMAL LOW (ref 39.0–52.0)
HCT: 46 % (ref 39.0–52.0)
Hemoglobin: 11.9 g/dL — ABNORMAL LOW (ref 13.0–17.0)
Hemoglobin: 12.9 g/dL — ABNORMAL LOW (ref 13.0–17.0)
Hemoglobin: 15.6 g/dL (ref 13.0–17.0)
Hemoglobin: 8.5 g/dL — ABNORMAL LOW (ref 13.0–17.0)
Hemoglobin: 8.5 g/dL — ABNORMAL LOW (ref 13.0–17.0)
Hemoglobin: 9.2 g/dL — ABNORMAL LOW (ref 13.0–17.0)
O2 Saturation: 76 %
O2 Saturation: 81 %
O2 Saturation: 88 %
O2 Saturation: 91 %
O2 Saturation: 93 %
O2 Saturation: 95 %
Patient temperature: 36.9
Patient temperature: 36.9
Patient temperature: 37
Patient temperature: 37
Patient temperature: 37
Patient temperature: 37
Potassium: 4.6 mmol/L (ref 3.5–5.1)
Potassium: 4.6 mmol/L (ref 3.5–5.1)
Potassium: 4.7 mmol/L (ref 3.5–5.1)
Potassium: 4.7 mmol/L (ref 3.5–5.1)
Potassium: 4.8 mmol/L (ref 3.5–5.1)
Potassium: 5 mmol/L (ref 3.5–5.1)
Sodium: 149 mmol/L — ABNORMAL HIGH (ref 135–145)
Sodium: 149 mmol/L — ABNORMAL HIGH (ref 135–145)
Sodium: 150 mmol/L — ABNORMAL HIGH (ref 135–145)
Sodium: 150 mmol/L — ABNORMAL HIGH (ref 135–145)
Sodium: 150 mmol/L — ABNORMAL HIGH (ref 135–145)
Sodium: 150 mmol/L — ABNORMAL HIGH (ref 135–145)
TCO2: 30 mmol/L (ref 22–32)
TCO2: 31 mmol/L (ref 22–32)
TCO2: 31 mmol/L (ref 22–32)
TCO2: 31 mmol/L (ref 22–32)
TCO2: 31 mmol/L (ref 22–32)
TCO2: 31 mmol/L (ref 22–32)
pCO2 arterial: 46.5 mmHg (ref 32–48)
pCO2 arterial: 49.3 mmHg — ABNORMAL HIGH (ref 32–48)
pCO2 arterial: 49.6 mmHg — ABNORMAL HIGH (ref 32–48)
pCO2 arterial: 50.8 mmHg — ABNORMAL HIGH (ref 32–48)
pCO2 arterial: 52.8 mmHg — ABNORMAL HIGH (ref 32–48)
pCO2 arterial: 53.7 mmHg — ABNORMAL HIGH (ref 32–48)
pH, Arterial: 7.343 — ABNORMAL LOW (ref 7.35–7.45)
pH, Arterial: 7.356 (ref 7.35–7.45)
pH, Arterial: 7.365 (ref 7.35–7.45)
pH, Arterial: 7.378 (ref 7.35–7.45)
pH, Arterial: 7.386 (ref 7.35–7.45)
pH, Arterial: 7.414 (ref 7.35–7.45)
pO2, Arterial: 44 mmHg — ABNORMAL LOW (ref 83–108)
pO2, Arterial: 47 mmHg — ABNORMAL LOW (ref 83–108)
pO2, Arterial: 58 mmHg — ABNORMAL LOW (ref 83–108)
pO2, Arterial: 65 mmHg — ABNORMAL LOW (ref 83–108)
pO2, Arterial: 68 mmHg — ABNORMAL LOW (ref 83–108)
pO2, Arterial: 78 mmHg — ABNORMAL LOW (ref 83–108)

## 2024-05-26 LAB — BASIC METABOLIC PANEL WITH GFR
Anion gap: 8 (ref 5–15)
Anion gap: 8 (ref 5–15)
BUN: 42 mg/dL — ABNORMAL HIGH (ref 8–23)
BUN: 39 mg/dL — ABNORMAL HIGH (ref 8–23)
CO2: 28 mmol/L (ref 22–32)
CO2: 29 mmol/L (ref 22–32)
Calcium: 8.1 mg/dL — ABNORMAL LOW (ref 8.9–10.3)
Calcium: 8.2 mg/dL — ABNORMAL LOW (ref 8.9–10.3)
Chloride: 112 mmol/L — ABNORMAL HIGH (ref 98–111)
Chloride: 114 mmol/L — ABNORMAL HIGH (ref 98–111)
Creatinine, Ser: 0.71 mg/dL (ref 0.61–1.24)
Creatinine, Ser: 0.7 mg/dL (ref 0.61–1.24)
GFR, Estimated: >60 mL/min (ref >60)
GFR, Estimated: >60 mL/min (ref >60)
Glucose, Bld: 115 mg/dL — ABNORMAL HIGH (ref 70–99)
Glucose, Bld: 129 mg/dL — ABNORMAL HIGH (ref 70–99)
Potassium: 4.7 mmol/L (ref 3.5–5.1)
Potassium: 4.8 mmol/L (ref 3.5–5.1)
Sodium: 148 mmol/L — ABNORMAL HIGH (ref 135–145)
Sodium: 151 mmol/L — ABNORMAL HIGH (ref 135–145)

## 2024-05-26 LAB — CBC
HCT: 28.6 % — ABNORMAL LOW (ref 39.0–52.0)
Hemoglobin: 8.9 g/dL — ABNORMAL LOW (ref 13.0–17.0)
MCH: 32.8 pg (ref 26.0–34.0)
MCHC: 31.1 g/dL (ref 30.0–36.0)
MCV: 105.5 fL — ABNORMAL HIGH (ref 80.0–100.0)
Platelets: 250 K/uL (ref 150–400)
RBC: 2.71 MIL/uL — ABNORMAL LOW (ref 4.22–5.81)
RDW: 15.4 % (ref 11.5–15.5)
WBC: 20.6 K/uL — ABNORMAL HIGH (ref 4.0–10.5)
nRBC: 0.2 % (ref 0.0–0.2)

## 2024-05-26 LAB — CG4 I-STAT (LACTIC ACID)
Lactic Acid, Venous: 0.5 mmol/L (ref 0.5–1.9)
Lactic Acid, Venous: 0.6 mmol/L (ref 0.5–1.9)
Lactic Acid, Venous: 0.6 mmol/L (ref 0.5–1.9)
Lactic Acid, Venous: 0.7 mmol/L (ref 0.5–1.9)
Lactic Acid, Venous: 0.7 mmol/L (ref 0.5–1.9)

## 2024-05-26 LAB — CBC WITH DIFFERENTIAL/PLATELET
Abs Immature Granulocytes: 0.9 K/uL — ABNORMAL HIGH (ref 0.00–0.07)
Basophils Absolute: 0.1 K/uL (ref 0.0–0.1)
Basophils Relative: 0 %
Eosinophils Absolute: 1.2 K/uL — ABNORMAL HIGH (ref 0.0–0.5)
Eosinophils Relative: 5 %
HCT: 28.8 % — ABNORMAL LOW (ref 39.0–52.0)
Hemoglobin: 8.9 g/dL — ABNORMAL LOW (ref 13.0–17.0)
Immature Granulocytes: 4 %
Lymphocytes Relative: 8 %
Lymphs Abs: 2 K/uL (ref 0.7–4.0)
MCH: 32.1 pg (ref 26.0–34.0)
MCHC: 30.9 g/dL (ref 30.0–36.0)
MCV: 104 fL — ABNORMAL HIGH (ref 80.0–100.0)
Monocytes Absolute: 1.3 K/uL — ABNORMAL HIGH (ref 0.1–1.0)
Monocytes Relative: 5 %
Neutro Abs: 18.7 K/uL — ABNORMAL HIGH (ref 1.7–7.7)
Neutrophils Relative %: 78 %
Platelets: 268 K/uL (ref 150–400)
RBC: 2.77 MIL/uL — ABNORMAL LOW (ref 4.22–5.81)
RDW: 15.4 % (ref 11.5–15.5)
WBC: 24.2 K/uL — ABNORMAL HIGH (ref 4.0–10.5)
nRBC: 0.3 % — ABNORMAL HIGH (ref 0.0–0.2)

## 2024-05-26 LAB — APTT
aPTT: 65 s — ABNORMAL HIGH (ref 24–36)
aPTT: 73 s — ABNORMAL HIGH (ref 24–36)
aPTT: 71 s — ABNORMAL HIGH (ref 24–36)

## 2024-05-26 LAB — GLUCOSE, CAPILLARY
Glucose-Capillary: 119 mg/dL — ABNORMAL HIGH (ref 70–99)
Glucose-Capillary: 125 mg/dL — ABNORMAL HIGH (ref 70–99)
Glucose-Capillary: 127 mg/dL — ABNORMAL HIGH (ref 70–99)
Glucose-Capillary: 132 mg/dL — ABNORMAL HIGH (ref 70–99)
Glucose-Capillary: 144 mg/dL — ABNORMAL HIGH (ref 70–99)
Glucose-Capillary: 146 mg/dL — ABNORMAL HIGH (ref 70–99)

## 2024-05-26 LAB — SURGICAL PCR SCREEN
MRSA, PCR: NEGATIVE
Staphylococcus aureus: NEGATIVE

## 2024-05-26 LAB — FIBRINOGEN: Fibrinogen: 592 mg/dL — ABNORMAL HIGH (ref 210–475)

## 2024-05-26 LAB — LACTATE DEHYDROGENASE: LDH: 478 U/L — ABNORMAL HIGH (ref 98–192)

## 2024-05-26 LAB — MAGNESIUM: Magnesium: 2.3 mg/dL (ref 1.7–2.4)

## 2024-05-26 LAB — PROTIME-INR
INR: 1.8 — ABNORMAL HIGH (ref 0.8–1.2)
Prothrombin Time: 21.4 s — ABNORMAL HIGH (ref 11.4–15.2)

## 2024-05-26 MED ORDER — CLONAZEPAM 1 MG PO TABS
1.0000 mg | ORAL_TABLET | Freq: Three times a day (TID) | ORAL | Status: DC
  Administered 2024-05-26 – 2024-05-27 (×4): 1 mg
  Filled 2024-05-26 (×4): qty 1

## 2024-05-26 MED ORDER — MUPIROCIN 2 % EX OINT
1.0000 | TOPICAL_OINTMENT | Freq: Two times a day (BID) | CUTANEOUS | Status: AC
  Administered 2024-05-26 – 2024-05-31 (×10): 1 via NASAL
  Filled 2024-05-26 (×5): qty 22

## 2024-05-26 MED ORDER — OLANZAPINE 5 MG PO TABS
5.0000 mg | ORAL_TABLET | Freq: Every day | ORAL | Status: DC
  Administered 2024-05-26: 5 mg via NASOGASTRIC
  Filled 2024-05-26: qty 1

## 2024-05-26 MED ORDER — ROCURONIUM BROMIDE 10 MG/ML (PF) SYRINGE
PREFILLED_SYRINGE | INTRAVENOUS | Status: AC
Start: 2024-05-26 — End: 2024-05-26
  Filled 2024-05-26: qty 10

## 2024-05-26 NOTE — Progress Notes (Signed)
 TCTS Evening Rounds:  Hemodynamics stable  Remains sedated and paralyzed on vent and VV ECMO. Tried to stop paralytics today but breathing becomes dyskinetic and drops sats and ECMO flow.  UO good.  Planning Trach tomorrow.

## 2024-05-26 NOTE — Progress Notes (Signed)
 PHARMACY - ANTICOAGULATION CONSULT NOTE  Pharmacy Consult for heparin  >> bivalirudin  Indication: VTE, VV ECMO (10/29 - current)  Allergies  Allergen Reactions   Asenapine Other (See Comments) and Nausea And Vomiting    Increased tremors   Dextromethorphan Hbr    Guaifenesin     Latuda [Lurasidone Hcl] Other (See Comments)    Tremors     Lurasidone Other (See Comments) and Hives    Other Reaction(s): Angioedema   Phenylephrine      Patient Measurements: Height: 6' 3 (190.5 cm) Weight: 110.3 kg (243 lb 2.7 oz) IBW/kg (Calculated) : 84.5  Vital Signs: Temp: 98.4 F (36.9 C) (11/02 1645) Temp Source: Axillary (11/02 1100) BP: 99/65 (11/02 1600) Pulse Rate: 70 (11/02 1645)  Labs: Recent Labs    05/24/24 0401 05/24/24 0408 05/25/24 0411 05/25/24 0417 05/25/24 1604 05/25/24 1607 05/26/24 0450 05/26/24 0802 05/26/24 1215 05/26/24 1255 05/26/24 1556 05/26/24 1631  HGB 9.1*   < > 9.0*   < > 9.6*   < > 8.9*   < > 11.9*  --  8.9* 12.9*  HCT 29.2*   < > 29.6*   < > 30.8*   < > 28.8*   < > 35.0*  --  28.6* 38.0*  PLT 248   < > 285  --  269  --  268  --   --   --  250  --   APTT 45*   < > 63*  --  69*  --  65*  --   --  73* 71*  --   LABPROT 17.0*  --  20.5*  --   --   --  21.4*  --   --   --   --   --   INR 1.3*  --  1.7*  --   --   --  1.8*  --   --   --   --   --   CREATININE 0.84   < > 0.85  --  0.75  --  0.71  --   --   --   --   --    < > = values in this interval not displayed.    Estimated Creatinine Clearance: 126.7 mL/min (by C-G formula based on SCr of 0.71 mg/dL).   Medical History: Past Medical History:  Diagnosis Date   Anginal pain    Anxiety    Arthritis    Bipolar 1 disorder (HCC)    Bursitis    CAD (coronary artery disease)    Stent placed 2017   Cancer (HCC)    Chronic pain    COPD (chronic obstructive pulmonary disease) (HCC)    Current use of long term anticoagulation    DAPT (ASA + clopidogrel )   Depression    Diverticulitis     Dyspnea    GERD (gastroesophageal reflux disease)    Grade I diastolic dysfunction    Hepatitis C 2012   No longer has Hep C   HLD (hyperlipidemia)    Hypertension    MI (myocardial infarction) (HCC)    Polysubstance abuse (HCC)    cocaine, marijuana, ETOH   PUD (peptic ulcer disease)    S/P angioplasty with stent 06/10/2016   a.) 90% stenosis of pLAD to mLAD - 2.5 x 18 mm Xience Alpine (DES x 1) placed to pLAD   S/P PTCA (percutaneous transluminal coronary angioplasty) 12/04/2019   a.) 60% in stent restenosis of DES to pLAD; LVEF 65%.   Schizophrenia (HCC)  Stroke [facility])    Tremors    generalized   Valvular insufficiency    a.) Mild MR, TR, PR; mild to moderate AR on 03/05/2018 TTE      Assessment: 63 yoM admitted for RUL lobectomy.  Pt intubated, now having difficulty with ventilation. Placed on VV ECMO 10/29 in the evening. Pharmacy to dose IV bivalirudin .   Previously on therapeutic heparin  (HL 0.33 on 1700/h) for acute DVT of gastrocnemius vein   aPTT 65s is subtherapeutic with bivalirudin  running at 0.15 mg/kg/hr. Hgb (8.9) and PLTs (268) are stable. LDH and fibrinogen are stable. Per RN, no report of pauses, issues with the line, or signs of bleeding.   There are clots noted in the 12, 3, and 9 o'clock corners of the filter with some fibrin stranding. There is a bit more fibrin stranding today, clots continue to worsen slightly. Will increase the rate slightly given clots in filter.    Goal of Therapy:  aPTT 60-80s Monitor platelets by anticoagulation protocol: Yes   Plan:  Increase bivalirudin  at 0.16 mg/kg/hr  Check 4 hour aPTT Monitor twice daily aPTT and CBC  ----------------------------------------------------------------------------------  11/2 1700 update: aPTT returned at 71s on bivalirudin  0.16 mg/kg/hr  Plan: Continue bivalirudin  at 0.16 mg/kg/hr until stop time of midnight   Thank you for allowing pharmacy to be a part of this patient's care.    Larraine Brazier, PharmD Clinical Pharmacist 05/26/2024  5:12 PM **Pharmacist phone directory can now be found on [email or web address] ([email or web address]).  Listed under [facility].

## 2024-05-26 NOTE — Progress Notes (Signed)
 Patient ID: Keith Gibbs, male   DOB: 29-Jul-1960, 63 y.o.   MRN: [id number]    Advanced Heart Failure Rounding Note   Subjective:    Placed on VV ECMO day #4  He remains on cisatracurium.  Vent @ FiO2 100%, Vt 80s.   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 Clots noted in the oxygenator at 3 and 9, streaking at 12. Trach aspirate with rare Pseudomonas, patient remains on meropenem.   I/Os near even, CVP 9.  No pressors.   VVECMO Flow 4.55 L/min Speed 3600 rpm deltaP 29 Pven -98 LDH 478 ABG 7.41/46.5/78 PTT 65 on bivalirudin  gtt  Objective:   Weight Range:  Vital Signs:   Temp:  [98.2 F (36.8 C)-98.8 F (37.1 C)] 98.6 F (37 C) (11/02 0700) Pulse Rate:  [63-85] 66 (11/02 0700) Resp:  [13-82] 15 (11/02 0740) BP: (97-119)/(62-65) 104/65 (11/02 0400) SpO2:  [83 %-99 %] 99 % (11/02 0700) Arterial Line BP: (112-143)/(39-54) 133/50 (11/02 0700) FiO2 (%):  [60 %-100 %] 100 % (11/02 0740) Weight:  [110.3 kg] 110.3 kg (11/02 0630) Last BM Date : 05/25/24  Weight change: Filed Weights   05/24/24 0400 05/25/24 0700 05/26/24 0630  Weight: 107.8 kg 107 kg 110.3 kg    Intake/Output:   Intake/Output Summary (Last 24 hours) at 05/26/2024 0806 Last data filed at 05/26/2024 0700 Gross per 24 hour  Intake 3689.31 ml  Output 2960 ml  Net 729.31 ml     Physical Exam: General: Intubated/sedated Neck: ECMO cannula RIJ, no thyromegaly or thyroid  nodule.  Lungs: Clear to auscultation bilaterally with normal respiratory effort. CV: Nondisplaced PMI.  Heart regular S1/S2, no S3/S4, no murmur.  Trace ankle edema.  Abdomen: Soft, nontender, no hepatosplenomegaly, no distention.  Skin: Intact without lesions or rashes.  Neurologic: Sedated on vent. Extremities: No clubbing or cyanosis.  HEENT: Normal.   Telemetry: NSR 60-70s Personally reviewed   Labs: Basic Metabolic Panel: Recent Labs  Lab 05/20/24 0500 05/20/24 1019 05/21/24 0421 05/21/24 1424  05/22/24 0451 05/22/24 0746 05/23/24 0445 05/23/24 0453 05/24/24 0401 05/24/24 0408 05/24/24 1700 05/24/24 1931 05/25/24 0411 05/25/24 0417 05/25/24 1604 05/25/24 1607 05/25/24 1756 05/25/24 2005 05/26/24 0003 05/26/24 0448 05/26/24 0450  NA 135   < > 139   < > 143   < > 146*   < > 147*   < > 149*   < > 148*   < > 148*   < > 150* 150* 149* 150* 148*  K 4.6   < > 4.7   < > 4.8   < > 5.3*   < > 5.1   < > 4.7   < > 4.9   < > 4.8   < > 4.8 5.0 5.0 4.8 4.7  CL 94*  --  100  --  100   < > 106   < > 107  --  110  --  111  --  113*  --   --   --   --   --  112*  CO2 25  --  29  --  29   < > 29   < > 29  --  28  --  29  --  27  --   --   --   --   --  28  GLUCOSE 185*  --  161*  --  134*   < > 113*   < > 109*  --  125*  --  124*  --  143*  --   --   --   --   --  115*  BUN 47*  --  48*  --  51*   < > 46*   < > 50*  --  48*  --  48*  --  47*  --   --   --   --   --  42*  CREATININE 1.85*  --  1.12  --  0.98   < > 0.88   < > 0.84  --  0.77  --  0.85  --  0.75  --   --   --   --   --  0.71  CALCIUM  7.6*  --  7.6*  --  7.9*   < > 7.5*   < > 8.2*  --  8.3*  --  8.2*  --  8.3*  --   --   --   --   --  8.1*  MG 2.6*  --  3.3*  --  3.1*  --  3.1*  --  3.0*  --   --   --  2.5*  --   --   --   --   --   --   --   --   PHOS 7.1*  --  3.2  --  3.2  --  3.1  --  3.7  --   --   --   --   --   --   --   --   --   --   --   --    < > = values in this interval not displayed.    Liver Function Tests: Recent Labs  Lab 05/22/24 1710 05/23/24 0445 05/24/24 0401 05/25/24 0411 05/26/24 0450  AST 54* 31 56* 57* 117*  ALT 28 23 50* 59* 84*  ALKPHOS 79 79 92 92 111  BILITOT 0.6 0.5 0.7 0.5 0.8  PROT 5.1* 5.2* 5.3* 5.2* 5.1*  ALBUMIN  1.6* 1.8* 1.9* 2.1* 1.9*   No results for input(s): LIPASE, AMYLASE in the last 168 hours. No results for input(s): AMMONIA in the last 168 hours.  CBC: Recent Labs  Lab 05/22/24 0451 05/22/24 0746 05/23/24 0445 05/23/24 0453 05/24/24 0401 05/24/24 0408  05/24/24 1700 05/24/24 1931 05/25/24 0411 05/25/24 0417 05/25/24 1604 05/25/24 1607 05/25/24 1756 05/25/24 2005 05/26/24 0003 05/26/24 0448 05/26/24 0450  WBC 19.7*   < > 26.7*   < > 21.2*  --  20.0*  --  21.0*  --  22.6*  --   --   --   --   --  24.2*  NEUTROABS 13.5*  --  18.6*  --  15.5*  --   --   --  15.9*  --   --   --   --   --   --   --  18.7*  HGB 10.4*   < > 8.9*   < > 9.1*   < > 9.0*   < > 9.0*   < > 9.6*   < > 8.8* 9.2* 9.2* 8.5* 8.9*  HCT 32.0*   < > 28.0*   < > 29.2*   < > 28.8*   < > 29.6*   < > 30.8*   < > 26.0* 27.0* 27.0* 25.0* 28.8*  MCV 100.6*   < > 102.2*   < > 104.3*  --  104.3*  --  105.0*  --  105.5*  --   --   --   --   --  104.0*  PLT 215   < > 263   < > 248  --  268  --  285  --  269  --   --   --   --   --  268   < > = values in this interval not displayed.    Cardiac Enzymes: No results for input(s): CKTOTAL, CKMB, CKMBINDEX, TROPONINI in the last 168 hours.  BNP: BNP (last 3 results) Recent Labs    05/30/23 1059 03/20/24 1419  BNP 34.3 20.6    ProBNP (last 3 results) No results for input(s): PROBNP in the last 8760 hours.      Medications:     Scheduled Medications:  arformoterol  15 mcg Nebulization BID   artificial tears  1 Application Both Eyes Q8H   budesonide  (PULMICORT ) nebulizer solution  0.25 mg Nebulization BID   Chlorhexidine  Gluconate Cloth  6 each Topical Daily   clonazePAM  1 mg Per Tube Q8H   feeding supplement (PROSource TF20)  60 mL Per Tube QID   free water   150 mL Per Tube Q6H   gabapentin   600 mg Per Tube Daily   And   gabapentin   1,200 mg Per Tube QHS   insulin aspart  0-15 Units Subcutaneous Q4H   mouth rinse  15 mL Mouth Rinse Q2H   oxyCODONE   10 mg Per Tube QID   pantoprazole  (PROTONIX ) IV  40 mg Intravenous QHS   polyethylene glycol  17 g Per Tube Daily   QUEtiapine   200 mg Per Tube BID   revefenacin  175 mcg Nebulization Daily   senna-docusate  2 tablet Per Tube QHS   sodium chloride  flush   10-40 mL Intracatheter Q12H   sodium chloride  flush  10-40 mL Intracatheter Q12H    Infusions:  albumin  human Stopped (05/24/24 1506)   bivalirudin  (ANGIOMAX ) 250 mg in sodium chloride  0.9 % 500 mL (0.5 mg/mL) infusion 0.15 mg/kg/hr (05/26/24 0700)   cisatracurium (NIMBEX) 200 mg in sodium chloride  0.9 % 100 mL (2 mg/mL) infusion 3 mcg/kg/min (05/26/24 0700)   feeding supplement (VITAL 1.5 CAL) 60 mL/hr at 05/26/24 0721   HYDROmorphone  4 mg/hr (05/26/24 0700)   ketamine  (KETALAR ) adult infusion 0.5 mg/kg/hr (05/26/24 0700)   meropenem (MERREM) IV Stopped (05/26/24 0624)   micafungin (MYCAMINE) 200 mg in sodium chloride  0.9 % 100 mL IVPB Stopped (05/25/24 1805)   midazolam  15 mg/hr (05/26/24 0700)   niCARDipine Stopped (05/24/24 2221)   norepinephrine (LEVOPHED) Adult infusion 0 mcg/min (05/25/24 1646)    PRN Medications: albumin  human, hydrALAZINE , HYDROmorphone , hydrOXYzine , ipratropium-albuterol , lactulose, midazolam , ondansetron  (ZOFRAN ) IV, mouth rinse, mouth rinse, sodium chloride  flush, sodium chloride  flush   Assessment/Plan:   1. 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  Adjusting antipsychotics to allow.  - Continue micafungin and meropenem.  - Transfuse hgb < 7.5 2. RUL lung CA s/p lobectomy 3. COPD with ongoing tobacco use 4. Bipolar d/o 5. CAD 6. Candida glabrata fungemia/PNA; Pseudomonas also in BAL - ID following - on micafungin - on meropenem.  7.  HTN - ensure adequate sedation under paralytics - add nicardipine  gtt as needed  Remains critically ill with ARDS/fungal PNA/PTX on top of severe underlying lung disease. Will need to wean off paralytic. Plan trach Monday with ENT  CRITICAL CARE Performed by: [name]  Total critical care time: 45 minutes  Critical care time was exclusive of separately billable procedures and treating other patients.  Critical care was necessary to treat or prevent imminent or life-threatening deterioration.  Critical care was time spent personally by me on the following activities: development of treatment plan with patient and/or surrogate as well as nursing, discussions with consultants, evaluation of patient's response to treatment, examination of patient, obtaining history from patient or surrogate, ordering and performing treatments and interventions, ordering and review of laboratory studies, ordering and review of radiographic studies, pulse oximetry and re-evaluation of patient's condition.  [name] 05/26/2024 8:13 AM

## 2024-05-26 NOTE — Progress Notes (Signed)
 NAME:  Keith Gibbs, MRN:  [id number], DOB:  20-Mar-1961, LOS: 17 ADMISSION DATE:  05/09/2024, CONSULTATION DATE: 05/14/2024 REFERRING MD: CTS, CHIEF COMPLAINT: Acute on chronic hypoxic respiratory failure  History of Present Illness:  63 yo male presented for lobectomy with CTS. Progressing as expected post operatively with CT in place and was on 4E when this morning he decompensated with worsening hypoxemia and increased wob. CTS transferred to ICU and requested CCM evaluation.   Patient went into ARDS and eventually cannulated for VV ECMO.    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  He was difficult to oxygenate and ventilate therefore APRV started.  Patient seems to be tolerating the APRV. 10/29 -nitric oxide is weaned off overnight.  It has not helped.  It did not change his respiratory status.  Patient continues to have ARDS with bilateral pulmonary infiltrates.  At this stage we decided to proning.  Chest tube is hooked up to suction. 10/29 -patient's oxygenation improved with proning but ventilation got worse.  At this stage given the ARDS a collective decision is made to cannulate the patient for VV ECMO.  Family was in agreement. 10/30 -blood cultures from 05/19/2024 and BAL cultures from 05/19/2024 grew Candida glabrata 10/31 - 11/2 unable to get the patient off paralysis.    Interim History / Subjective:  Unable to get the patient of paralysis.  Several attempts were made yesterday but patient wakes up the flows decreased.  Left IJ HD changed to PICC line.  Discussed with cardiology team on ECMO  rounds this morning.  Patient's tidal volumes have been low.  We hooked his chest tube to suction did not help.  For now chest tube is under waterseal.  Objective    Blood pressure (!) 113/58, pulse 71, temperature (!) 97.4 F (36.3 C), temperature source Axillary, resp. rate (!) 63, height 6' 3 (1.905 m), weight 110.3 kg, SpO2 99%. CVP:  [1 mmHg-22 mmHg] 7 mmHg  Vent Mode: PCV FiO2 (%):  [60 %-100 %] 100 % Set Rate:  [15 bmp-16 bmp] 15 bmp PEEP:  [10 cmH20] 10 cmH20 Plateau Pressure:  [16 cmH20-20 cmH20] 19 cmH20   Intake/Output Summary (Last 24 hours) at 05/26/2024 1153 Last data filed at 05/26/2024 1134 Gross per 24 hour  Intake 3922.35 ml  Output 3325 ml  Net 597.35 ml   Filed Weights   05/24/24 0400 05/25/24 0700 05/26/24 0630  Weight: 107.8 kg 107 kg 110.3 kg    Examination: General: Critically ill appearing man intubated, right IJ Avalon catheter.  Deeply sedated and paralyzed today. HEENT: Both pupils equal sluggish reacting to light there is right internal jugular dual-lumen ECMO catheter.  Lungs: Equal air entry bilaterally.  Crackles bibasal.  Prolonged expiratory phase Cardiovascular: S1-S2 heard no S3 no murmur Abdomen: Soft nontender nondistended Extremities: No trauma mild pedal edema.  No deformities. 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  No high fevers today.  Current temperature is 99.7.  Several modes of ventilation including nitric oxide did not help.  Pulmonary did not help.  Eventually patient went on VV ECMO for ARDS management.  Acute hypoxic hypercapnic respiratory failure ARDS Status post right upper lobe pneumonectomy History of COPD with acute  exacerbation FEV1/ FVC on 9/25 is 74.  PFTs  mild obstructive lung disease. Acute pulmonary edema Possible aspiration Adenocarcinoma of the lung status post right upper lobe lobectomy Right pneumothorax was pneumonectomy status post chest tube placement  -Patient is cannulated for VV ECMO on 10/29 for ARDS as we failed to oxygenate and ventilate and several modes of ventilation. ECMO: Flow is 4.28 L/min Delta P is 27 eFiO2 88% Sweep is 8 L/min On bivalirudin  for anticoagulation -will be held at 12 PM today for tracheostomy on Monday  Ventilator: Pressure control ventilation with 100% FiO2 PEEP is 10. Inspiratory pressure is 20 peak pressure is 20.    Sedation with Versed , Ketamine  and Dilaudid  Oh Seroquel  200 twice daily and oral oxycodone  and oral Klonopin Will add olanzapine  5 mg once a day today. Will reattempt to try to wean down and stop Nimbex. Currently BIS goal of 40  - Nitric oxide is weaned down on 05/22/2024 - Stop Pulmicort  05/26/2024 - Completed Decadron  therapy on 05/23/2024. - DuoNebs every 4 hours - Monitor chest tube output.  Currently there is air leak. - Off IV Lasix since 05/24/2024. - Tube feeds reach goal. - Patient is on the list for tracheostomy on Monday.  Patient's stop anticoagulation at 12 PM tomorrow.    Septic shock Fungal pneumonia -Candida glabrata from BAL on 05/19/2024 Fungemia -Candida glabrata on blood cultures from 05/19/2024 Pneumonia- Pseudomonas growing from BAL 05/19/2024 - Continue  meropenem  - vancomycin  stopped 10/31 - On IV micafungin -Appreciate ID input. - Patient is off vasopressor therapy.  Acute metabolic encephalopathy ICU delirium, in the setting of patient with a history of anxiety, and schizoaffective disorder bipolar type  - Currently on Versed , ketamine  and Dilaudid . - On oral Neurontin  and Seroquel . -On oral oxycodone  and Klonopin -Added Zyprexa  today. -Try to wean down Nimbex if successful will change  preschools to RASS and try to wean down Versed . - Stop celocoxib and hold cymbalta  as we cannot crush the medication for tube - Sodium valproate will be held.  Acute Kidney Injury - likely pre renal from septic shock this is resolved. - Monitor renal functions and urine output. - Monitor electrolytes and replace accordingly.  DVT Lower extremity - Heparin  changed to bivalirudin  per ECMO protocol - Doppler shows lower extremity DVT in gastronemius vein   Increased NG output - Abdominal x-ray did not show any ileus. - Tube feeds are ongoing.    Anemia - Hemoglobin stable at 10.4.   Full Code On bivalirudin . On Protonix  Currently tube feeds at goal 60 cc an hour Had a lot of bowel movements yesterday after sorbitol Will change left IJ to a PICC line.  I met the mother and the sister of the patient in the hallway and updated them.  Labs   CBC: Recent Labs  Lab 05/22/24 0451 05/22/24 0746 05/23/24 0445 05/23/24 0453 05/24/24 0401 05/24/24 0408 05/24/24 1700 05/24/24 1931 05/25/24 0411 05/25/24 0417 05/25/24 1604 05/25/24 1607 05/25/24 2005 05/26/24 0003 05/26/24 0448 05/26/24 0450 05/26/24 0802  WBC 19.7*   < > 26.7*   < > 21.2*  --  20.0*  --  21.0*  --  22.6*  --   --   --   --  24.2*  --   NEUTROABS 13.5*  --  18.6*  --  15.5*  --   --   --  15.9*  --   --   --   --   --   --  18.7*  --   HGB 10.4*   < > 8.9*   < > 9.1*   < > 9.0*   < > 9.0*   < > 9.6*   < > 9.2* 9.2* 8.5* 8.9* 15.6  HCT 32.0*   < > 28.0*   < > 29.2*   < > 28.8*   < > 29.6*   < > 30.8*   < > 27.0* 27.0* 25.0* 28.8* 46.0  MCV 100.6*   < > 102.2*   < > 104.3*  --  104.3*  --  105.0*  --  105.5*  --   --   --   --  104.0*  --   PLT 215   < > 263   < > 248  --  268  --  285  --  269  --   --   --   --  268  --    < > = values in this interval not displayed.    Basic Metabolic Panel: Recent Labs  Lab 05/20/24 0500 05/20/24 1019 05/21/24 0421 05/21/24 1424 05/22/24 0451 05/22/24 0746  05/23/24 0445 05/23/24 0453 05/24/24 0401 05/24/24 0408 05/24/24 1700 05/24/24 1931 05/25/24 0411 05/25/24 0417 05/25/24 1604 05/25/24 1607 05/25/24 2005 05/26/24 0003 05/26/24 0448 05/26/24 0450 05/26/24 0802  NA 135   < > 139   < > 143   < > 146*   < > 147*   < > 149*   < > 148*   < > 148*   < > 150* 149* 150* 148* 149*  K 4.6   < > 4.7   < > 4.8   < > 5.3*   < > 5.1   < > 4.7   < > 4.9   < > 4.8   < > 5.0 5.0 4.8 4.7 4.6  CL 94*  --  100  --  100   < > 106   < > 107  --  110  --  111  --  113*  --   --   --   --  112*  --   CO2 25  --  29  --  29   < > 29   < > 29  --  28  --  29  --  27  --   --   --   --  28  --   GLUCOSE 185*  --  161*  --  134*   < > 113*   < > 109*  --  125*  --  124*  --  143*  --   --   --   --  115*  --   BUN 47*  --  48*  --  51*   < > 46*   < > 50*  --  48*  --  48*  --  47*  --   --   --   --  42*  --   CREATININE 1.85*  --  1.12  --  0.98   < > 0.88   < > 0.84  --  0.77  --  0.85  --  0.75  --   --   --   --  0.71  --   CALCIUM  7.6*  --  7.6*  --  7.9*   < > 7.5*   < > 8.2*  --  8.3*  --  8.2*  --  8.3*  --   --   --   --  8.1*  --   MG 2.6*  --  3.3*  --  3.1*  --  3.1*  --  3.0*  --   --   --  2.5*  --   --   --   --   --   --  2.3  --   PHOS 7.1*  --  3.2  --  3.2  --  3.1  --  3.7  --   --   --   --   --   --   --   --   --   --   --   --    < > = values in this interval not displayed.   GFR: Estimated Creatinine Clearance: 126.7 mL/min (by C-G formula based on SCr of 0.71 mg/dL). Recent Labs  Lab 05/24/24 1700 05/25/24 0411 05/25/24 0435 05/25/24 1604 05/25/24 1647 05/25/24 1958 05/26/24 0003 05/26/24 0450 05/26/24 0548 05/26/24 0753  WBC 20.0* 21.0*  --  22.6*  --   --   --  24.2*  --   --   LATICACIDVEN  --   --    < >  --    < > 0.7 0.7  --  0.6 0.7   < > = values in this interval not displayed.    Liver Function Tests: Recent Labs  Lab 05/22/24 1710 05/23/24 0445 05/24/24 0401 05/25/24 0411 05/26/24 0450  AST 54* 31 56*  57* 117*  ALT 28 23 50* 59* 84*  ALKPHOS 79 79 92 92 111  BILITOT 0.6 0.5 0.7 0.5 0.8  PROT 5.1* 5.2* 5.3* 5.2* 5.1*  ALBUMIN  1.6* 1.8* 1.9* 2.1* 1.9*    No results for input(s): LIPASE, AMYLASE in the last 168 hours. No results for input(s): AMMONIA in the last 168 hours.  ABG    Component Value Date/Time   PHART 7.365 05/26/2024 0802   PCO2ART 49.6 (H) 05/26/2024 0802   PO2ART 65 (L) 05/26/2024 0802   HCO3 28.4 (H) 05/26/2024 0802   TCO2 30 05/26/2024 0802   ACIDBASEDEF 1.0 05/19/2024 1154   O2SAT 91 05/26/2024 0802     Coagulation Profile: Recent Labs  Lab 05/22/24 1710 05/23/24 0445 05/24/24 0401 05/25/24 0411 05/26/24 0450  INR 1.3* 1.1 1.3* 1.7* 1.8*    Cardiac Enzymes: No results for input(s): CKTOTAL, CKMB, CKMBINDEX, TROPONINI in the last 168 hours.  HbA1C: Hgb A1c MFr Bld  Date/Time Value Ref Range Status  02/12/2024 08:40 AM 5.3 4.8 - 5.6 % Final    Comment:             Prediabetes: 5.7 - 6.4          Diabetes: >6.4          Glycemic control for adults with diabetes: <7.0   04/26/2022 03:30 PM 5.2 4.8 - 5.6 % Final    Comment:    (NOTE) Pre diabetes:          5.7%-6.4%  Diabetes:              >6.4%  Glycemic control for   <7.0% adults with diabetes  CBG: Recent Labs  Lab 05/25/24 1234 05/25/24 1615 05/25/24 1958 05/26/24 0001 05/26/24 0446  GLUCAP 142* 162* 144* 144* 127*    Review of Systems:   Negative except above  Past Medical History:  He,  has a past medical history of Anginal pain, Anxiety, Arthritis, Bipolar 1 disorder (HCC), Bursitis, CAD (coronary artery disease), Cancer (HCC), Chronic pain, COPD (chronic obstructive pulmonary disease) (HCC), Current use of long term anticoagulation, Depression, Diverticulitis, Dyspnea, GERD (gastroesophageal reflux disease), Grade I diastolic dysfunction, Hepatitis C (2012), HLD (hyperlipidemia), Hypertension, MI (myocardial infarction) (HCC), Polysubstance abuse (HCC),  PUD (peptic ulcer disease), S/P angioplasty with stent (06/10/2016), S/P PTCA (percutaneous transluminal coronary angioplasty) (12/04/2019), Schizophrenia (HCC), Stroke (HCC), Tremors, and Valvular insufficiency.   Surgical History:   Past Surgical History:  Procedure Laterality Date   ABDOMINAL SURGERY     removed small piece of intestines due to [facility] Diverticulosis   ANTERIOR LAT LUMBAR FUSION N/A 08/23/2022   Procedure: DIRECT LATERAL INTERBODY FUSION  LUMBAR TWO- LUMBAR THREE, LUMBAR THREE-LUMBAR FOUR , EXPLORE AND EXTEND FUSION LUMBAR TWO-LUMBAR FIVE, POSTERIOR DECOMPRESSION LUMBAR THREE-LUMBAR FOUR, LEFT LUMBAR TWO-LUMBAR THREE;  Surgeon: Debby Dorn MATSU, MD;  Location: [facility] OR;  Service: Neurosurgery;  Laterality: N/A;   APPENDECTOMY     BACK SURGERY     CARDIAC CATHETERIZATION Left 06/10/2016   Procedure: Left Heart Cath and Coronary Angiography;  Surgeon: Denyse DELENA Bathe, MD;  Location: [facility] INVASIVE CV LAB;  Service: Cardiovascular;  Laterality: Left;   CARDIAC CATHETERIZATION N/A 06/10/2016   Procedure: Coronary Stent Intervention;  Surgeon: Cara JONETTA Lovelace, MD;  Location: [facility] INVASIVE CV LAB;  Service: Cardiovascular;  Laterality: N/A;   CHOLECYSTECTOMY N/A 02/17/2022   Procedure: LAPAROSCOPIC CHOLECYSTECTOMY;  Surgeon: Stevie Herlene Righter, MD;  Location: [facility] OR;  Service: General;  Laterality: N/A;   COLON SURGERY     COLONOSCOPY     COLONOSCOPY WITH PROPOFOL  N/A 01/05/2017   Procedure: COLONOSCOPY WITH PROPOFOL ;  Surgeon: Therisa Bi, MD;  Location: [facility] ENDOSCOPY;  Service: Endoscopy;  Laterality: N/A;   COLONOSCOPY WITH PROPOFOL  N/A 02/13/2020   Procedure: COLONOSCOPY WITH PROPOFOL ;  Surgeon: Janalyn Keene NOVAK, MD;  Location: [facility] ENDOSCOPY;  Service: Endoscopy;  Laterality: N/A;   CORONARY ANGIOPLASTY WITH STENT PLACEMENT     CORONARY PRESSURE/FFR STUDY N/A 12/04/2019   Procedure: INTRAVASCULAR PRESSURE WIRE/FFR STUDY;  Surgeon: Wonda Sharper, MD;  Location: [facility]  INVASIVE CV LAB;  Service: Cardiovascular;  Laterality: N/A;   ECMO CANNULATION N/A 05/22/2024   Procedure: ECMO CANNULATION;  Surgeon: Cherrie Toribio SAUNDERS, MD;  Location: [facility] INVASIVE CV LAB;  Service: Cardiovascular;  Laterality: N/A;   ESOPHAGOGASTRODUODENOSCOPY (EGD) WITH PROPOFOL  N/A 01/05/2017   Procedure: ESOPHAGOGASTRODUODENOSCOPY (EGD) WITH PROPOFOL ;  Surgeon: Therisa Bi, MD;  Location: [facility] ENDOSCOPY;  Service: Endoscopy;  Laterality: N/A;   ESOPHAGOGASTRODUODENOSCOPY (EGD) WITH PROPOFOL  N/A 02/13/2020   Procedure: ESOPHAGOGASTRODUODENOSCOPY (EGD) WITH PROPOFOL ;  Surgeon: Janalyn Keene NOVAK, MD;  Location: [facility] ENDOSCOPY;  Service: Endoscopy;  Laterality: N/A;   INTERCOSTAL NERVE BLOCK  05/09/2024   Procedure: BLOCK, NERVE, INTERCOSTAL;  Surgeon: Kerrin Elspeth BROCKS, MD;  Location: [facility] OR;  Service: Thoracic;;   KNEE ARTHROSCOPY WITH MEDIAL MENISECTOMY Right 09/05/2017   Procedure: KNEE ARTHROSCOPY WITH MEDIAL AND LATERAL  MENISECTOMY PARTIAL SYNOVECTOMY;  Surgeon: Kathlynn Sharper, MD;  Location: [facility] ORS;  Service: Orthopedics;  Laterality: Right;   LEFT HEART CATH AND CORONARY ANGIOGRAPHY N/A 12/04/2019   Procedure: LEFT HEART CATH AND CORONARY ANGIOGRAPHY;  Surgeon: Wonda Sharper, MD;  Location: [facility] INVASIVE  CV LAB;  Service: Cardiovascular;  Laterality: N/A;   LEFT HEART CATH AND CORONARY ANGIOGRAPHY Left 11/25/2022   Procedure: LEFT HEART CATH AND CORONARY ANGIOGRAPHY;  Surgeon: Fernand Denyse LABOR, MD;  Location: [facility] INVASIVE CV LAB;  Service: Cardiovascular;  Laterality: Left;   LOBECTOMY, LUNG, ROBOT-ASSISTED, USING VATS Right 05/09/2024   Procedure: LOBECTOMY, LUNG RIGHT UPPER LOBE, ROBOT-ASSISTED, USING VATS;  Surgeon: Kerrin Elspeth BROCKS, MD;  Location: [facility] OR;  Service: Thoracic;  Laterality: Right;   LYMPH NODE BIOPSY  05/09/2024   Procedure: LYMPH NODE BIOPSY;  Surgeon: Kerrin Elspeth BROCKS, MD;  Location: [facility] OR;  Service: Thoracic;;   REVERSE SHOULDER ARTHROPLASTY Right  08/31/2023   Procedure: RIGHT REVERSE SHOULDER ARTHROPLASTY;  Surgeon: [name], MD;  Location: [facility] OR;  Service: Orthopedics;  Laterality: Right;   SHOULDER SURGERY Right 04/09/2012   SPINE SURGERY     TOTAL KNEE ARTHROPLASTY Right 01/19/2021   Procedure: TOTAL KNEE ARTHROPLASTY - [place] Amber to Assist;  Surgeon: Kathlynn Sharper, MD;  Location: [facility] ORS;  Service: Orthopedics;  Laterality: Right;   WEDGE RESECTION, LUNG, ROBOT-ASSISTED, THORACOSCOPIC Right 05/09/2024   Procedure: WEDGE RESECTION, LUNG RIGHT UPPER LOBE, ROBOT-ASSISTED, THORACOSCOPIC;  Surgeon: Kerrin Elspeth BROCKS, MD;  Location: [facility] OR;  Service: Thoracic;  Laterality: Right;     Social History:   reports that he quit smoking about 25 years ago. His smoking use included cigarettes. He started smoking about 45 years ago. He has a 63.9 pack-year smoking history. He has been exposed to tobacco smoke. He has never used smokeless tobacco. He reports that he does not currently use alcohol  after a past usage of about 3.0 standard drinks of alcohol  per week. He reports that he does not currently use drugs after having used the following drugs: Cocaine and Marijuana.   Family History:  His family history includes Depression in his mother; Early death in his father; Heart attack in his father; Heart disease in his father and mother; Hypertension in his father, mother, and sister; Osteoarthritis in his mother; Parkinson's disease in his maternal grandfather. There is no history of Prostate cancer, Bladder Cancer, Kidney cancer, or Tremor.   Allergies Allergies  Allergen Reactions   Asenapine Other (See Comments) and Nausea And Vomiting    Increased tremors   Dextromethorphan Hbr    Guaifenesin     Latuda [Lurasidone Hcl] Other (See Comments)    Tremors     Lurasidone Other (See Comments) and Hives    Other Reaction(s): Angioedema   Phenylephrine       Home Medications  Prior to Admission medications   Medication Sig Start  Date End Date Taking? Authorizing Provider  acetaminophen  (TYLENOL ) 325 MG tablet Take 1 tablet (325 mg total) by mouth every 6 (six) hours as needed for mild pain (pain score 1-3) (or temp > 100.5). 09/01/23  Yes [name], MD  albuterol  (VENTOLIN  HFA) 108 [phone] Base) MCG/ACT inhaler Inhale 2 puffs into the lungs every 6 (six) hours as needed for wheezing or shortness of breath. 12/19/23  Yes Lorren Greig PARAS, NP  aspirin  EC 81 MG tablet Take 1 tablet (81 mg total) by mouth daily. Swallow whole. 02/03/23  Yes Evelena, Nadir, MD  atorvastatin  (LIPITOR ) 80 MG tablet TAKE 1 TABLET BY MOUTH EVERY DAY 01/15/24  Yes [name], MD  budesonide -glycopyrrolate -formoterol  (BREZTRI  AEROSPHERE) 160-9-4.8 MCG/ACT AERO inhaler Inhale 2 puffs into the lungs in the morning and at bedtime. 12/19/23  Yes Lorren, Amy J, NP  celecoxib  (CELEBREX ) 100 MG  capsule TAKE 2 CAPSULES BY MOUTH TWICE A DAY 04/25/24  Yes Georgina Ozell LABOR, MD  divalproex  (DEPAKOTE ) 500 MG DR tablet Take half tablet in the morning and 3 tablets at bedtime Patient taking differently: Take half tablet in the morning, 1 in the afternoon, and 2 tablets at bedtime 04/25/24  Yes Izella Ismael NOVAK, MD  DULoxetine  (CYMBALTA ) 30 MG capsule Take 3 capsules (90 mg total) by mouth daily. Patient taking differently: Take 30 mg by mouth 3 (three) times daily. 04/25/24 06/24/24 Yes Izella Ismael NOVAK, MD  gabapentin  (NEURONTIN ) 600 MG tablet Take 1 tablet (600 mg total) by mouth daily AND 2 tablets (1,200 mg total) at bedtime. 04/25/24  Yes Izella Ismael NOVAK, MD  hydrOXYzine  (ATARAX ) 25 MG tablet Take 1 tablet (25 mg total) by mouth 2 (two) times daily as needed. Patient taking differently: Take 25 mg by mouth daily. 04/25/24  Yes Izella Ismael NOVAK, MD  isosorbide  mononitrate (IMDUR ) 30 MG 24 hr tablet TAKE 1 TABLET BY MOUTH 2 TIMES DAILY. 04/09/24  Yes Fernand Denyse LABOR, MD  Multiple Vitamins-Minerals (MENS 50+ MULTIVITAMIN) TABS Take 1 tablet by mouth in the morning.    Yes [provider]  nicotine  polacrilex (NICOTINE  MINI) 2 MG lozenge Take 1 lozenge (2 mg total) by mouth every 2 (two) hours as needed for smoking cessation. 03/26/24 06/24/24 Yes Dgayli, Belva, MD  nitroGLYCERIN  (NITROSTAT ) 0.4 MG SL tablet Place 1 tablet (0.4 mg total) under the tongue every 5 (five) minutes as needed for chest pain. 02/03/23 05/06/24 Yes Attiah, Nadir, MD  pantoprazole  (PROTONIX ) 40 MG tablet Take 1 tablet (40 mg total) by mouth daily. 12/19/23  Yes Lorren, Amy J, NP  QUEtiapine  (SEROQUEL ) 200 MG tablet Take 1 tablet (200 mg total) by mouth at bedtime. 04/25/24  Yes Izella Ismael NOVAK, MD  traZODone  (DESYREL ) 100 MG tablet Take 0.5 tablets (50 mg total) by mouth at bedtime for 7 days. Patient not taking: Reported on 05/06/2024 04/25/24 05/02/24  Izella Ismael NOVAK, MD          My critical care time: 45 minutes   05/26/2024, 11:53 AM  Tamela Stakes, MD  Attending Physician, Critical Care Medicine [facility] See Amion for pager If no response to pager, please call [phone] until 7pm After 7pm, Please call E-link [phone]

## 2024-05-26 NOTE — Progress Notes (Signed)
 Patient ID: Keith Gibbs, male   DOB: 1960/11/22, 63 y.o.   MRN: [id number] Extracorporeal support note   ECLS support day:4 Indication: ARDS/fungal PNA  Configuration: VV ECMO  Drainage cannula: Crescent cannula right IJ Return cannula: Crescent cannula right IJ  Pump speed: 3600 rpm Pump flow: 4.55 L/min Sweep gas: 8  Circuit check: Some fibrin noted  Anticoagulant: bivalirudin  Anticoagulation targets: 60-80   Changes in support: Wean paralytics   [name] 05/26/2024, 8:14 AM

## 2024-05-26 NOTE — Progress Notes (Addendum)
 PHARMACY - ANTICOAGULATION CONSULT NOTE  Pharmacy Consult for heparin  >> bivalirudin  Indication: VTE, VV ECMO (10/29 - current)  Allergies  Allergen Reactions   Asenapine Other (See Comments) and Nausea And Vomiting    Increased tremors   Dextromethorphan Hbr    Guaifenesin     Latuda [Lurasidone Hcl] Other (See Comments)    Tremors     Lurasidone Other (See Comments) and Hives    Other Reaction(s): Angioedema   Phenylephrine      Patient Measurements: Height: 6' 3 (190.5 cm) Weight: 110.3 kg (243 lb 2.7 oz) IBW/kg (Calculated) : 84.5  Vital Signs: Temp: 98.6 F (37 C) (11/02 0700) BP: 104/65 (11/02 0400) Pulse Rate: 66 (11/02 0700)  Labs: Recent Labs    05/24/24 0401 05/24/24 0408 05/25/24 0411 05/25/24 0417 05/25/24 1604 05/25/24 1607 05/26/24 0003 05/26/24 0448 05/26/24 0450  HGB 9.1*   < > 9.0*   < > 9.6*   < > 9.2* 8.5* 8.9*  HCT 29.2*   < > 29.6*   < > 30.8*   < > 27.0* 25.0* 28.8*  PLT 248   < > 285  --  269  --   --   --  268  APTT 45*   < > 63*  --  69*  --   --   --  65*  LABPROT 17.0*  --  20.5*  --   --   --   --   --  21.4*  INR 1.3*  --  1.7*  --   --   --   --   --  1.8*  CREATININE 0.84   < > 0.85  --  0.75  --   --   --  0.71   < > = values in this interval not displayed.    Estimated Creatinine Clearance: 126.7 mL/min (by C-G formula based on SCr of 0.71 mg/dL).   Medical History: Past Medical History:  Diagnosis Date   Anginal pain    Anxiety    Arthritis    Bipolar 1 disorder (HCC)    Bursitis    CAD (coronary artery disease)    Stent placed 2017   Cancer (HCC)    Chronic pain    COPD (chronic obstructive pulmonary disease) (HCC)    Current use of long term anticoagulation    DAPT (ASA + clopidogrel )   Depression    Diverticulitis    Dyspnea    GERD (gastroesophageal reflux disease)    Grade I diastolic dysfunction    Hepatitis C 2012   No longer has Hep C   HLD (hyperlipidemia)    Hypertension    MI (myocardial  infarction) (HCC)    Polysubstance abuse (HCC)    cocaine, marijuana, ETOH   PUD (peptic ulcer disease)    S/P angioplasty with stent 06/10/2016   a.) 90% stenosis of pLAD to mLAD - 2.5 x 18 mm Xience Alpine (DES x 1) placed to pLAD   S/P PTCA (percutaneous transluminal coronary angioplasty) 12/04/2019   a.) 60% in stent restenosis of DES to pLAD; LVEF 65%.   Schizophrenia (HCC)    Stroke (HCC)    Tremors    generalized   Valvular insufficiency    a.) Mild MR, TR, PR; mild to moderate AR on 03/05/2018 TTE      Assessment: 63 yoM admitted for RUL lobectomy.  Pt intubated, now having difficulty with ventilation. Placed on VV ECMO 10/29 in the evening. Pharmacy to dose IV bivalirudin .  Previously on therapeutic heparin  (HL 0.33 on 1700/h) for acute DVT of gastrocnemius vein   aPTT 65s is subtherapeutic with bivalirudin  running at 0.15 mg/kg/hr. Hgb (8.9) and PLTs (268) are stable. LDH and fibrinogen are stable. Per RN, no report of pauses, issues with the line, or signs of bleeding.   There are clots noted in the 12, 3, and 9 o'clock corners of the filter with some fibrin stranding. There is a bit more fibrin stranding today, clots continue to worsen slightly. Will increase the rate slightly given clots in filter.    Goal of Therapy:  aPTT 60-80s Monitor platelets by anticoagulation protocol: Yes   Plan:  Increase bivalirudin  at 0.16 mg/kg/hr  Check 4 hour aPTT Monitor twice daily aPTT and CBC  ----------------------------------------------------------------------------------  11/2 Afternoon update: aPTT returned at 73s on bivalirudin  0.16 mg/kg/hr  Plan: Continue bivalirudin  at 0.16 mg/kg/hr   Thank you for allowing pharmacy to be a part of this patient's care.   Nidia Schaffer, PharmD PGY2 Cardiology Pharmacy Resident  Please check AMION for all [facility] Pharmacy phone numbers After 10:00 PM, call Main Pharmacy [phone] 05/26/2024  7:28 AM

## 2024-05-26 NOTE — Progress Notes (Signed)
 4 Days Post-Op Procedure(s) (LRB): ECMO CANNULATION (N/A) Subjective:  Hemodynamically stable on VV  ECMO but have had to increase flow to maintain oxygenation. Vent on 100% 10 PEEP with TV 100 cc on Pressure limit 40  Objective: Vital signs in last 24 hours: Temp:  [97.4 F (36.3 C)-98.8 F (37.1 C)] 97.4 F (36.3 C) (11/02 1100) Pulse Rate:  [62-85] 72 (11/02 1100) Cardiac Rhythm: Normal sinus rhythm (11/02 0800) Resp:  [13-82] 72 (11/02 1100) BP: (97-119)/(58-65) 113/58 (11/02 0800) SpO2:  [83 %-100 %] 98 % (11/02 1100) Arterial Line BP: (112-163)/(39-66) 137/53 (11/02 1100) FiO2 (%):  [60 %-100 %] 100 % (11/02 1037) Weight:  [110.3 kg] 110.3 kg (11/02 0630)  Hemodynamic parameters for last 24 hours: CVP:  [1 mmHg-22 mmHg] 8 mmHg  Intake/Output from previous day: 11/01 0701 - 11/02 0700 In: 3791.1 [I.V.:1592.4; WH/HU:8211.2; IV Piggyback:410] Out: 3135 [Urine:2885; Stool:220; Chest Tube:30] Intake/Output this shift: Total I/O In: 564.7 [I.V.:324.7; NG/GT:240] Out: 515 [Urine:515]  General appearance: sedated and paralyzed on vent Neurologic: unable to assess Heart: regular rate and rhythm, S1, S2 normal, no murmur Lungs: barely audible BS Abdomen: soft, bowel sounds normal Extremities: edema mild Wound: incision ok CT output low, no air leak.  Lab Results: Recent Labs    05/25/24 1604 05/25/24 1607 05/26/24 0450 05/26/24 0802  WBC 22.6*  --  24.2*  --   HGB 9.6*   < > 8.9* 15.6  HCT 30.8*   < > 28.8* 46.0  PLT 269  --  268  --    < > = values in this interval not displayed.   BMET:  Recent Labs    05/25/24 1604 05/25/24 1607 05/26/24 0450 05/26/24 0802  NA 148*   < > 148* 149*  K 4.8   < > 4.7 4.6  CL 113*  --  112*  --   CO2 27  --  28  --   GLUCOSE 143*  --  115*  --   BUN 47*  --  42*  --   CREATININE 0.75  --  0.71  --   CALCIUM  8.3*  --  8.1*  --    < > = values in this interval not displayed.    PT/INR:  Recent Labs    05/26/24 0450   LABPROT 21.4*  INR 1.8*   ABG    Component Value Date/Time   PHART 7.365 05/26/2024 0802   HCO3 28.4 (H) 05/26/2024 0802   TCO2 30 05/26/2024 0802   ACIDBASEDEF 1.0 05/19/2024 1154   O2SAT 91 05/26/2024 0802   CBG (last 3)  Recent Labs    05/25/24 1958 05/26/24 0001 05/26/24 0446  GLUCAP 144* 144* 127*   CXR: unchanged bilateral airspace disease with consolidation/atelectasis of right lung, right pleural space unchanged.   Assessment/Plan:  Continuing support on VV ECMO for acute hypoxemic, hypercapnic respiratory failure. Vent settings about maxed out for this pt. CCM and AHF team managing ECMO/Vent.   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 Pseudomonas from BAL. On Merrem and micafungin.   Acute metabolic encephalopathy: keeping sedated and paralyzed to keep stable on ECMO and vent.  Planning trach per ENT tomorrow.  LOS: 17 days    Keith Gibbs 05/26/2024

## 2024-05-27 ENCOUNTER — Inpatient Hospital Stay (HOSPITAL_COMMUNITY): Payer: MEDICAID

## 2024-05-27 ENCOUNTER — Inpatient Hospital Stay (HOSPITAL_COMMUNITY): Payer: MEDICAID | Admitting: Certified Registered Nurse Anesthetist

## 2024-05-27 ENCOUNTER — Encounter: Payer: Self-pay | Admitting: Radiology

## 2024-05-27 ENCOUNTER — Encounter (HOSPITAL_COMMUNITY)
Admission: RE | Disposition: E | Payer: Self-pay | Source: Home / Self Care | Attending: Thoracic Surgery (Cardiothoracic Vascular Surgery)

## 2024-05-27 DIAGNOSIS — Z902 Acquired absence of lung [part of]: Secondary | ICD-10-CM | POA: Diagnosis not present

## 2024-05-27 DIAGNOSIS — Z87891 Personal history of nicotine dependence: Secondary | ICD-10-CM | POA: Diagnosis not present

## 2024-05-27 DIAGNOSIS — I10 Essential (primary) hypertension: Secondary | ICD-10-CM | POA: Diagnosis not present

## 2024-05-27 DIAGNOSIS — J9601 Acute respiratory failure with hypoxia: Secondary | ICD-10-CM | POA: Diagnosis not present

## 2024-05-27 DIAGNOSIS — B371 Pulmonary candidiasis: Secondary | ICD-10-CM | POA: Diagnosis not present

## 2024-05-27 DIAGNOSIS — J9690 Respiratory failure, unspecified, unspecified whether with hypoxia or hypercapnia: Secondary | ICD-10-CM | POA: Diagnosis not present

## 2024-05-27 DIAGNOSIS — R7401 Elevation of levels of liver transaminase levels: Secondary | ICD-10-CM

## 2024-05-27 DIAGNOSIS — J80 Acute respiratory distress syndrome: Secondary | ICD-10-CM | POA: Diagnosis not present

## 2024-05-27 DIAGNOSIS — I509 Heart failure, unspecified: Secondary | ICD-10-CM | POA: Diagnosis not present

## 2024-05-27 DIAGNOSIS — J9602 Acute respiratory failure with hypercapnia: Secondary | ICD-10-CM | POA: Diagnosis not present

## 2024-05-27 DIAGNOSIS — I2510 Atherosclerotic heart disease of native coronary artery without angina pectoris: Secondary | ICD-10-CM

## 2024-05-27 DIAGNOSIS — E870 Hyperosmolality and hypernatremia: Secondary | ICD-10-CM

## 2024-05-27 DIAGNOSIS — J939 Pneumothorax, unspecified: Secondary | ICD-10-CM | POA: Diagnosis not present

## 2024-05-27 DIAGNOSIS — R739 Hyperglycemia, unspecified: Secondary | ICD-10-CM

## 2024-05-27 HISTORY — PX: TRACHEOSTOMY TUBE PLACEMENT: SHX814

## 2024-05-27 LAB — TYPE AND SCREEN
ABO/RH(D): O POS
Antibody Screen: NEGATIVE
Unit division: 0
Unit division: 0
Unit division: 0
Unit division: 0

## 2024-05-27 LAB — POCT I-STAT 7, (LYTES, BLD GAS, ICA,H+H)
Acid-Base Excess: 3 mmol/L — ABNORMAL HIGH (ref 0.0–2.0)
Acid-Base Excess: 3 mmol/L — ABNORMAL HIGH (ref 0.0–2.0)
Acid-Base Excess: 3 mmol/L — ABNORMAL HIGH (ref 0.0–2.0)
Acid-Base Excess: 3 mmol/L — ABNORMAL HIGH (ref 0.0–2.0)
Acid-Base Excess: 3 mmol/L — ABNORMAL HIGH (ref 0.0–2.0)
Acid-Base Excess: 4 mmol/L — ABNORMAL HIGH (ref 0.0–2.0)
Bicarbonate: 28 mmol/L (ref 20.0–28.0)
Bicarbonate: 28.5 mmol/L — ABNORMAL HIGH (ref 20.0–28.0)
Bicarbonate: 28.8 mmol/L — ABNORMAL HIGH (ref 20.0–28.0)
Bicarbonate: 28.8 mmol/L — ABNORMAL HIGH (ref 20.0–28.0)
Bicarbonate: 28.8 mmol/L — ABNORMAL HIGH (ref 20.0–28.0)
Bicarbonate: 29.1 mmol/L — ABNORMAL HIGH (ref 20.0–28.0)
Calcium, Ion: 1.22 mmol/L (ref 1.15–1.40)
Calcium, Ion: 1.22 mmol/L (ref 1.15–1.40)
Calcium, Ion: 1.23 mmol/L (ref 1.15–1.40)
Calcium, Ion: 1.23 mmol/L (ref 1.15–1.40)
Calcium, Ion: 1.23 mmol/L (ref 1.15–1.40)
Calcium, Ion: 1.26 mmol/L (ref 1.15–1.40)
HCT: 23 % — ABNORMAL LOW (ref 39.0–52.0)
HCT: 23 % — ABNORMAL LOW (ref 39.0–52.0)
HCT: 24 % — ABNORMAL LOW (ref 39.0–52.0)
HCT: 24 % — ABNORMAL LOW (ref 39.0–52.0)
HCT: 24 % — ABNORMAL LOW (ref 39.0–52.0)
HCT: 24 % — ABNORMAL LOW (ref 39.0–52.0)
Hemoglobin: 7.8 g/dL — ABNORMAL LOW (ref 13.0–17.0)
Hemoglobin: 7.8 g/dL — ABNORMAL LOW (ref 13.0–17.0)
Hemoglobin: 8.2 g/dL — ABNORMAL LOW (ref 13.0–17.0)
Hemoglobin: 8.2 g/dL — ABNORMAL LOW (ref 13.0–17.0)
Hemoglobin: 8.2 g/dL — ABNORMAL LOW (ref 13.0–17.0)
Hemoglobin: 8.2 g/dL — ABNORMAL LOW (ref 13.0–17.0)
O2 Saturation: 81 %
O2 Saturation: 82 %
O2 Saturation: 83 %
O2 Saturation: 83 %
O2 Saturation: 83 %
O2 Saturation: 86 %
Patient temperature: 36.8
Patient temperature: 36.9
Patient temperature: 36.9
Patient temperature: 37
Patient temperature: 37
Patient temperature: 37.1
Potassium: 4.4 mmol/L (ref 3.5–5.1)
Potassium: 4.8 mmol/L (ref 3.5–5.1)
Potassium: 4.8 mmol/L (ref 3.5–5.1)
Potassium: 4.8 mmol/L (ref 3.5–5.1)
Potassium: 4.9 mmol/L (ref 3.5–5.1)
Potassium: 5 mmol/L (ref 3.5–5.1)
Sodium: 150 mmol/L — ABNORMAL HIGH (ref 135–145)
Sodium: 150 mmol/L — ABNORMAL HIGH (ref 135–145)
Sodium: 150 mmol/L — ABNORMAL HIGH (ref 135–145)
Sodium: 151 mmol/L — ABNORMAL HIGH (ref 135–145)
Sodium: 151 mmol/L — ABNORMAL HIGH (ref 135–145)
Sodium: 151 mmol/L — ABNORMAL HIGH (ref 135–145)
TCO2: 29 mmol/L (ref 22–32)
TCO2: 30 mmol/L (ref 22–32)
TCO2: 30 mmol/L (ref 22–32)
TCO2: 30 mmol/L (ref 22–32)
TCO2: 30 mmol/L (ref 22–32)
TCO2: 31 mmol/L (ref 22–32)
pCO2 arterial: 46.3 mmHg (ref 32–48)
pCO2 arterial: 46.9 mmHg (ref 32–48)
pCO2 arterial: 47.4 mmHg (ref 32–48)
pCO2 arterial: 47.6 mmHg (ref 32–48)
pCO2 arterial: 48.5 mmHg — ABNORMAL HIGH (ref 32–48)
pCO2 arterial: 52.9 mmHg — ABNORMAL HIGH (ref 32–48)
pH, Arterial: 7.349 — ABNORMAL LOW (ref 7.35–7.45)
pH, Arterial: 7.382 (ref 7.35–7.45)
pH, Arterial: 7.384 (ref 7.35–7.45)
pH, Arterial: 7.387 (ref 7.35–7.45)
pH, Arterial: 7.389 (ref 7.35–7.45)
pH, Arterial: 7.402 (ref 7.35–7.45)
pO2, Arterial: 46 mmHg — ABNORMAL LOW (ref 83–108)
pO2, Arterial: 48 mmHg — ABNORMAL LOW (ref 83–108)
pO2, Arterial: 49 mmHg — ABNORMAL LOW (ref 83–108)
pO2, Arterial: 49 mmHg — ABNORMAL LOW (ref 83–108)
pO2, Arterial: 50 mmHg — ABNORMAL LOW (ref 83–108)
pO2, Arterial: 52 mmHg — ABNORMAL LOW (ref 83–108)

## 2024-05-27 LAB — CBC
HCT: 27.8 % — ABNORMAL LOW (ref 39.0–52.0)
Hemoglobin: 8.7 g/dL — ABNORMAL LOW (ref 13.0–17.0)
MCH: 32.8 pg (ref 26.0–34.0)
MCHC: 31.3 g/dL (ref 30.0–36.0)
MCV: 104.9 fL — ABNORMAL HIGH (ref 80.0–100.0)
Platelets: 209 K/uL (ref 150–400)
RBC: 2.65 MIL/uL — ABNORMAL LOW (ref 4.22–5.81)
RDW: 15.3 % (ref 11.5–15.5)
WBC: 21.1 K/uL — ABNORMAL HIGH (ref 4.0–10.5)
nRBC: 0.2 % (ref 0.0–0.2)

## 2024-05-27 LAB — APTT
aPTT: 37 s — ABNORMAL HIGH (ref 24–36)
aPTT: 39 s — ABNORMAL HIGH (ref 24–36)

## 2024-05-27 LAB — HEPATIC FUNCTION PANEL
ALT: 102 U/L — ABNORMAL HIGH (ref 0–44)
AST: 89 U/L — ABNORMAL HIGH (ref 15–41)
Albumin: 1.8 g/dL — ABNORMAL LOW (ref 3.5–5.0)
Alkaline Phosphatase: 146 U/L — ABNORMAL HIGH (ref 38–126)
Bilirubin, Direct: 0.5 mg/dL — ABNORMAL HIGH (ref 0.0–0.2)
Indirect Bilirubin: 0.8 mg/dL (ref 0.3–0.9)
Total Bilirubin: 1.3 mg/dL — ABNORMAL HIGH (ref 0.0–1.2)
Total Protein: 5.1 g/dL — ABNORMAL LOW (ref 6.5–8.1)

## 2024-05-27 LAB — BPAM RBC
Blood Product Expiration Date: 202511242359
Blood Product Unit Number: 202511242359
Blood Product Unit Number: 202511242359
ISSUE DATE / TIME: 202511031402
ISSUE DATE / TIME: 202511242359
PRODUCT CODE: 202511031834
PRODUCT CODE: 202511242359
PRODUCT CODE: 202511242359
Unit Type and Rh: 202511031402
Unit Type and Rh: 202511242359
Unit Type and Rh: 202511242359
Unit Type and Rh: 5100
Unit Type and Rh: 5100
Unit Type and Rh: 5100
Unit Type and Rh: 5100
Unit Type and Rh: 5100

## 2024-05-27 LAB — CBC WITH DIFFERENTIAL/PLATELET
Abs Immature Granulocytes: 0.57 K/uL — ABNORMAL HIGH (ref 0.00–0.07)
Basophils Absolute: 0.1 K/uL (ref 0.0–0.1)
Basophils Relative: 0 %
Eosinophils Absolute: 1.8 K/uL — ABNORMAL HIGH (ref 0.0–0.5)
Eosinophils Relative: 9 %
HCT: 28.1 % — ABNORMAL LOW (ref 39.0–52.0)
Hemoglobin: 8.7 g/dL — ABNORMAL LOW (ref 13.0–17.0)
Immature Granulocytes: 3 %
Lymphocytes Relative: 9 %
Lymphs Abs: 2 K/uL (ref 0.7–4.0)
MCH: 32.6 pg (ref 26.0–34.0)
MCHC: 31 g/dL (ref 30.0–36.0)
MCV: 105.2 fL — ABNORMAL HIGH (ref 80.0–100.0)
Monocytes Absolute: 1 K/uL (ref 0.1–1.0)
Monocytes Relative: 5 %
Neutro Abs: 16 K/uL — ABNORMAL HIGH (ref 1.7–7.7)
Neutrophils Relative %: 74 %
Platelets: 209 K/uL (ref 150–400)
RBC: 2.67 MIL/uL — ABNORMAL LOW (ref 4.22–5.81)
RDW: 15.4 % (ref 11.5–15.5)
WBC: 21.5 K/uL — ABNORMAL HIGH (ref 4.0–10.5)
nRBC: 0.4 % — ABNORMAL HIGH (ref 0.0–0.2)

## 2024-05-27 LAB — PROTIME-INR
INR: 1.3 — ABNORMAL HIGH (ref 0.8–1.2)
Prothrombin Time: 16.6 s — ABNORMAL HIGH (ref 11.4–15.2)

## 2024-05-27 LAB — GLUCOSE, CAPILLARY
Glucose-Capillary: 105 mg/dL — ABNORMAL HIGH (ref 70–99)
Glucose-Capillary: 107 mg/dL — ABNORMAL HIGH (ref 70–99)
Glucose-Capillary: 107 mg/dL — ABNORMAL HIGH (ref 70–99)
Glucose-Capillary: 112 mg/dL — ABNORMAL HIGH (ref 70–99)
Glucose-Capillary: 116 mg/dL — ABNORMAL HIGH (ref 70–99)
Glucose-Capillary: 128 mg/dL — ABNORMAL HIGH (ref 70–99)
Glucose-Capillary: 99 mg/dL (ref 70–99)

## 2024-05-27 LAB — BASIC METABOLIC PANEL WITH GFR
Anion gap: 8 (ref 5–15)
Anion gap: 11 (ref 5–15)
BUN: 38 mg/dL — ABNORMAL HIGH (ref 8–23)
BUN: 38 mg/dL — ABNORMAL HIGH (ref 8–23)
CO2: 29 mmol/L (ref 22–32)
CO2: 26 mmol/L (ref 22–32)
Calcium: 8.3 mg/dL — ABNORMAL LOW (ref 8.9–10.3)
Calcium: 8.3 mg/dL — ABNORMAL LOW (ref 8.9–10.3)
Chloride: 113 mmol/L — ABNORMAL HIGH (ref 98–111)
Chloride: 113 mmol/L — ABNORMAL HIGH (ref 98–111)
Creatinine, Ser: 0.65 mg/dL (ref 0.61–1.24)
Creatinine, Ser: 0.78 mg/dL (ref 0.61–1.24)
GFR, Estimated: >60 mL/min (ref >60)
GFR, Estimated: >60 mL/min (ref >60)
Glucose, Bld: 108 mg/dL — ABNORMAL HIGH (ref 70–99)
Glucose, Bld: 84 mg/dL (ref 70–99)
Potassium: 4.7 mmol/L (ref 3.5–5.1)
Potassium: 4.7 mmol/L (ref 3.5–5.1)
Sodium: 150 mmol/L — ABNORMAL HIGH (ref 135–145)
Sodium: 150 mmol/L — ABNORMAL HIGH (ref 135–145)

## 2024-05-27 LAB — CG4 I-STAT (LACTIC ACID)
Lactic Acid, Venous: 0.6 mmol/L (ref 0.5–1.9)
Lactic Acid, Venous: 0.7 mmol/L (ref 0.5–1.9)

## 2024-05-27 LAB — LACTATE DEHYDROGENASE
LDH: 405 U/L — ABNORMAL HIGH (ref 98–192)
LDH: 456 U/L — ABNORMAL HIGH (ref 98–192)

## 2024-05-27 LAB — FIBRINOGEN: Fibrinogen: 613 mg/dL — ABNORMAL HIGH (ref 210–475)

## 2024-05-27 SURGERY — CREATION, TRACHEOSTOMY
Anesthesia: General

## 2024-05-27 MED ORDER — POLYETHYLENE GLYCOL 3350 17 G PO PACK
17.0000 g | PACK | Freq: Every day | ORAL | Status: DC
  Administered 2024-05-28 – 2024-05-30 (×3): 17 g via ORAL
  Filled 2024-05-27 (×3): qty 1

## 2024-05-27 MED ORDER — FREE WATER
200.0000 mL | Freq: Four times a day (QID) | Status: DC
  Administered 2024-05-27 – 2024-05-28 (×4): 200 mL

## 2024-05-27 MED ORDER — CLONAZEPAM 1 MG PO TABS
1.0000 mg | ORAL_TABLET | Freq: Three times a day (TID) | ORAL | Status: DC
  Administered 2024-05-27 – 2024-06-03 (×20): 1 mg via ORAL
  Filled 2024-05-27 (×20): qty 1

## 2024-05-27 MED ORDER — HYDROXYZINE HCL 10 MG PO TABS: 10.0000 mg | ORAL_TABLET | Freq: Three times a day (TID) | ORAL | Status: DC | PRN

## 2024-05-27 MED ORDER — GABAPENTIN 250 MG/5ML PO SOLN
1200.0000 mg | Freq: Every day | ORAL | Status: DC
  Administered 2024-05-27 – 2024-06-02 (×7): 1200 mg via ORAL
  Filled 2024-05-27 (×8): qty 24

## 2024-05-27 MED ORDER — LIDOCAINE-EPINEPHRINE 1 %-1:100000 IJ SOLN
INTRAMUSCULAR | Status: AC
  Filled 2024-05-27: qty 1

## 2024-05-27 MED ORDER — GABAPENTIN 250 MG/5ML PO SOLN
600.0000 mg | Freq: Every day | ORAL | Status: DC
  Administered 2024-05-28 – 2024-06-02 (×6): 600 mg via ORAL
  Filled 2024-05-27 (×7): qty 12

## 2024-05-27 MED ORDER — LACTULOSE 10 GM/15ML PO SOLN: 30.0000 g | Freq: Every day | ORAL | Status: DC | PRN

## 2024-05-27 MED ORDER — OXYCODONE HCL 5 MG PO TABS
10.0000 mg | ORAL_TABLET | Freq: Four times a day (QID) | ORAL | Status: DC
  Administered 2024-05-27 – 2024-06-02 (×25): 10 mg via ORAL
  Filled 2024-05-27 (×26): qty 2

## 2024-05-27 MED ORDER — OLANZAPINE 10 MG IM SOLR
5.0000 mg | Freq: Every day | INTRAMUSCULAR | Status: DC
  Administered 2024-05-27 – 2024-05-29 (×3): 5 mg via INTRAVENOUS
  Filled 2024-05-27 (×4): qty 10

## 2024-05-27 MED ORDER — SENNOSIDES-DOCUSATE SODIUM 8.6-50 MG PO TABS
2.0000 | ORAL_TABLET | Freq: Every day | ORAL | Status: DC
  Administered 2024-05-27 – 2024-06-02 (×7): 2 via ORAL
  Filled 2024-05-27 (×7): qty 2

## 2024-05-27 MED ORDER — PROPOFOL 1000 MG/100ML IV EMUL
0.0000 ug/kg/min | INTRAVENOUS | Status: DC
  Administered 2024-05-27 (×2): 20 ug/kg/min via INTRAVENOUS
  Administered 2024-05-27 – 2024-05-28 (×2): 40 ug/kg/min via INTRAVENOUS
  Administered 2024-05-28 (×2): 50 ug/kg/min via INTRAVENOUS
  Administered 2024-05-28 (×2): 40 ug/kg/min via INTRAVENOUS
  Administered 2024-05-28: 20 ug/kg/min via INTRAVENOUS
  Administered 2024-05-28 (×2): 50 ug/kg/min via INTRAVENOUS
  Administered 2024-05-29: 30 ug/kg/min via INTRAVENOUS
  Administered 2024-05-29: 50 ug/kg/min via INTRAVENOUS
  Administered 2024-05-29: 30 ug/kg/min via INTRAVENOUS
  Administered 2024-05-29: 80 ug/kg/min via INTRAVENOUS
  Administered 2024-05-29: 40 ug/kg/min via INTRAVENOUS
  Administered 2024-05-29: 50 ug/kg/min via INTRAVENOUS
  Administered 2024-05-29: 30 ug/kg/min via INTRAVENOUS
  Administered 2024-05-30: 80 ug/kg/min via INTRAVENOUS
  Administered 2024-05-30 (×3): 50 ug/kg/min via INTRAVENOUS
  Administered 2024-05-30: 80 ug/kg/min via INTRAVENOUS
  Administered 2024-05-30: 70 ug/kg/min via INTRAVENOUS
  Administered 2024-05-30 (×3): 80 ug/kg/min via INTRAVENOUS
  Administered 2024-05-30 – 2024-05-31 (×10): 50 ug/kg/min via INTRAVENOUS
  Administered 2024-06-01: 80 ug/kg/min via INTRAVENOUS
  Administered 2024-06-01 (×4): 50 ug/kg/min via INTRAVENOUS
  Administered 2024-06-01: 80 ug/kg/min via INTRAVENOUS
  Administered 2024-06-01 (×3): 50 ug/kg/min via INTRAVENOUS
  Administered 2024-06-01 – 2024-06-02 (×3): 80 ug/kg/min via INTRAVENOUS
  Administered 2024-06-02: 60 ug/kg/min via INTRAVENOUS
  Administered 2024-06-02: 70 ug/kg/min via INTRAVENOUS
  Administered 2024-06-02: 40 ug/kg/min via INTRAVENOUS
  Administered 2024-06-02 (×2): 80 ug/kg/min via INTRAVENOUS
  Filled 2024-05-27 (×2): qty 100
  Filled 2024-05-27: qty 200
  Filled 2024-05-27 (×2): qty 100
  Filled 2024-05-27: qty 200
  Filled 2024-05-27: qty 100
  Filled 2024-05-27 (×2): qty 200
  Filled 2024-05-27 (×3): qty 100
  Filled 2024-05-27 (×2): qty 200
  Filled 2024-05-27 (×4): qty 100
  Filled 2024-05-27 (×3): qty 200
  Filled 2024-05-27 (×5): qty 100
  Filled 2024-05-27: qty 200
  Filled 2024-05-27 (×4): qty 100
  Filled 2024-05-27: qty 200
  Filled 2024-05-27 (×2): qty 100
  Filled 2024-05-27 (×2): qty 200
  Filled 2024-05-27 (×3): qty 100

## 2024-05-27 SURGICAL SUPPLY — 31 items
BAG COUNTER SPONGE SURGICOUNT (BAG) ×2 IMPLANT
BENZOIN TINCTURE PRP APPL 2/3 (GAUZE/BANDAGES/DRESSINGS) IMPLANT
BLADE CLIPPER SURG (BLADE) IMPLANT
CANISTER SUCTION 3000ML PPV (SUCTIONS) ×2 IMPLANT
CLEANER TIP ELECTROSURG 2X2 (MISCELLANEOUS) ×2 IMPLANT
COVER SURGICAL LIGHT HANDLE (MISCELLANEOUS) ×2 IMPLANT
DRAPE HALF SHEET 40X57 (DRAPES) IMPLANT
ELECT COATED BLADE 2.86 ST (ELECTRODE) ×2 IMPLANT
ELECTRODE REM PT RTRN 9FT ADLT (ELECTROSURGICAL) ×2 IMPLANT
GAUZE 4X4 16PLY ~~LOC~~+RFID DBL (SPONGE) ×2 IMPLANT
GLOVE ECLIPSE 7.5 STRL STRAW (GLOVE) ×2 IMPLANT
GOWN STRL REUS W/ TWL LRG LVL3 (GOWN DISPOSABLE) ×4 IMPLANT
KIT BASIN OR (CUSTOM PROCEDURE TRAY) ×2 IMPLANT
KIT TURNOVER KIT B (KITS) ×2 IMPLANT
NDL PRECISIONGLIDE 27X1.5 (NEEDLE) ×2 IMPLANT
NEEDLE PRECISIONGLIDE 27X1.5 (NEEDLE) ×1 IMPLANT
PAD ARMBOARD POSITIONER FOAM (MISCELLANEOUS) ×4 IMPLANT
PENCIL FOOT CONTROL (ELECTRODE) ×2 IMPLANT
SOLN 0.9% NACL POUR BTL 1000ML (IV SOLUTION) ×2 IMPLANT
SOLN STERILE WATER BTL 1000 ML (IV SOLUTION) ×2 IMPLANT
SPIKE FLUID TRANSFER (MISCELLANEOUS) ×2 IMPLANT
SUT CHROMIC 2 0 SH (SUTURE) ×2 IMPLANT
SUT ETHILON 3 0 PS 1 (SUTURE) ×2 IMPLANT
SUT SILK 4 0 TIE 10X30 (SUTURE) ×2 IMPLANT
SUT SILK 4-0 18XBRD TIE 12 (SUTURE) ×2 IMPLANT
SYR 20ML LL LF (SYRINGE) ×2 IMPLANT
SYR CONTROL 10ML LL (SYRINGE) IMPLANT
TOWEL GREEN STERILE FF (TOWEL DISPOSABLE) ×2 IMPLANT
TRAY ENT MC OR (CUSTOM PROCEDURE TRAY) ×2 IMPLANT
TUBE CONNECTING 12X1/4 (SUCTIONS) ×2 IMPLANT
TUBE TRACH FLEX 8.5 CUFF (MISCELLANEOUS) IMPLANT

## 2024-05-27 NOTE — Op Note (Signed)
 05/27/2024 3:45 PM   PATIENT:  Keith Gibbs, 63 y.o. male  PRE-OPERATIVE DIAGNOSIS:  RESPIRATORY FAILURE  POST-OPERATIVE DIAGNOSIS:  RESPIRATORY FAILURE   PROCEDURE:  Procedure(s): TRACHEOSTOMY  SURGEON:  Surgeon(s): Ida VEAR Loader, MD  ASSISTANTS: none   ANESTHESIA:   general  EBL: Minimal   DRAINS: none   LOCAL MEDICATIONS USED:  NONE  COUNTS CORRECT:  YES  PROCEDURE DETAILS: Patient was taken to the operating room and kept on the ICY bed in the supine position.  The patient was previously orally intubated. The neck was prepped and draped in a standard fashion. A vertical incision was created just above the sternal notch using electrocautery. The midline fascia was divided. The isthmus of the thyroid  was reflected superiorly and the upper trachea was exposed. A tracheotomy was created between the second and third tracheal rings in a horizontal fashion. A lower tracheal flap was created with scissors and the flap was sutured to the cervical skin using 2-0 chromic suture. The orotracheal tube was removed. The #6 Shiley tracheostomy tube was placed without difficulty and the cuff was inflated. The shield was secured to the neck using a Velcro straps and nylon suture. The patient was then transferred back to the intensive care unit in critical condition.  PLAN OF CARE: Transfer to ICU  PATIENT DISPOSITION:  ICU - hemodynamically stable.

## 2024-05-27 NOTE — Interval H&P Note (Signed)
 History and Physical Interval Note:  05/27/2024 1:52 PM  Louay W Hawe  has presented today for surgery, with the diagnosis of RESPIRATORY FAILURE.  The various methods of treatment have been discussed with the patient and family. After consideration of risks, benefits and other options for treatment, the patient has consented to  Procedure(s): CREATION, TRACHEOSTOMY (N/A) as a surgical intervention.  The patient's history has been reviewed, patient examined, no change in status, stable for surgery.  I have reviewed the patient's chart and labs.  Questions were answered to the patient's satisfaction.     Keith Gibbs

## 2024-05-27 NOTE — Progress Notes (Signed)
   [address], [place] [address]             [phone]    Trach this afternoon  BP 106/69   Pulse 66   Temp 98.2 F (36.8 C)   Resp (!) 0   Ht 6' 3 (1.905 m)   Wt 108.5 kg   SpO2 (!) 86%   BMI 29.90 kg/m    Intake/Output Summary (Last 24 hours) at 05/27/2024 1706 Last data filed at 05/27/2024 1700 Gross per 24 hour  Intake 3674.4 ml  Output 3395 ml  Net 279.4 ml   Family at bedside  Elko C. Kerrin, MD [facility] [phone]

## 2024-05-27 NOTE — Progress Notes (Signed)
 Patient transport back from OR to 2H04 on ventilator. RN and ECMO specialist at bedside.

## 2024-05-27 NOTE — Progress Notes (Addendum)
 PCCM Interval Note  Cis drip turned off 1515. Around 1745 called to the bedside for chugging and SpO2 in high 70's. On arrival patient with very minimal respiratory effort and not triggering ventilator. HR in 60's. Given 12.5 gm 5% albumin  and slowly increased flows back to 4.3 with chugging. Plan to keep flow around 3.8-4.2. Continue sedation for RASS goal -5 with no plan to resume cis at this time. Goal SpO2>80% and PaO2>45. If SpO2<80% and sustaining check ABG and if PaO2<45 plan to check lactate. If lactate elevated discuss additional interventions with advanced heart failure team. Dr. Zenaida and Gretta at bedside and agree with assessment and plan.   Rexene LOISE Blush, PA-C [facility] 05/27/24 5:54 PM  Please see [email or web address] for pager details.  From 7A-7P if no response, please call [phone] After hours, please call ELink [phone]

## 2024-05-27 NOTE — CV Procedure (Signed)
   TRANSESOPHAGEAL ECHOCARDIOGRAM  NAME:  Keith Gibbs    MRN: [id number] DOB:  May 16, 1961    ADMIT DATE: 05/09/2024  INDICATIONS: Fungemia  PROCEDURE:   Informed consent was obtained prior to the procedure. The risks, benefits and alternatives for the procedure were discussed and the patient comprehended these risks.  Risks include, but are not limited to, cough, sore throat, vomiting, nausea, somnolence, esophageal and stomach trauma or perforation, bleeding, low blood pressure, aspiration, pneumonia, infection, trauma to the teeth and death.    After a procedural timeout, the patient was already sedated and paralyzed.  The patient's heart rate, blood pressure, and oxygen saturation were monitored continuously during the procedure.   The transesophageal probe was inserted in the esophagus and stomach without difficulty and multiple views were obtained.  Remained intubated post procedure  COMPLICATIONS:    Complications: No complications.  The patient had normal neuro status and respiratory status post procedure with vitals stable as recorded elsewhere.    KEY FINDINGS:  No evidence of endocarditis, known AR which is stable. Valve thickening but no clear vegetation Normal LV/RV function Cannula retracted 2cm under TEE guidance  Full Report to follow.   Morene Brownie Advanced Heart Failure 5:19 PM

## 2024-05-27 NOTE — Progress Notes (Signed)
 [facility]  Date of Admission:  05/09/2024      Total days of antibiotics 10  Meropenem   Micafungin           ASSESSMENT: Keith Gibbs is a 63 y.o. male admitted with:   Fever, leukocytosis -  Fungemic w/ Candida Glabratta -  BCx from 10/26 (+) with candida glabratta growing; notified today with auto-alert. He was started on micafungin prior to realized fungemia for candida glabratta and tropicalis in sputum.  Lline infection (multiple lines, broad spectrum antibiotics and prolonged hospital stay) vs translocation from lungs. He is not eligible for line removal d/t hemodynamic instability.  Will try to treat through with IV micafungin - needs higher dose with ECMO circuit.  - Continue micafungin 200 mg daily - Would benefit from echocardiogram if we re-isolate fungus.   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 Complicated by worsening ARDS/respiratory failure and now escalated to intubation + VV ECMO with plans to trach him Monday. He has pseudomonas aeruginosa pneumonia (sensitive to all active abx).  Meropenem has been chosen by ECMO team due to the less disruptive PK effect vs cefepime with regards to sequestration in circuit. He is on day 9 of treatment.  Repeat BAL on 10/29 not growing anything but yeast at this point.  Clinically he has improved since starting antifungal and pressors off with stable temperatures. Off pressors  - would recommend stop meropenem after today to get through trach procedure today; more than covers the pseudomonas and concern over aspiration.   H/O Hepatitis C -  Chart reviewed indicates he was treated  in the past prior to 2021.  Not a priority at the movement to investigate this but could check RNA.    Substance Abuse -  Cocaine abuse. I don't see any history of documented IV drug use    PLAN: Continue micafungin 200 mg while on ecmo  Stop meropenem after 11/3 and follow  Lines changed out once stable to do so.  FU pending blood cultures    Principal Problem:   S/P lobectomy of lung Active Problems:   Status post robot-assisted surgical procedure   Acute hypoxic respiratory failure (HCC)   Anxiety disorder due to medical condition   Malnutrition of moderate degree   Acute respiratory distress syndrome (ARDS) due to COVID-19 virus (HCC)   Candidemia (HCC)   Central line infection   Pseudomonas aeruginosa infection   Ventilator associated pneumonia (HCC)   Patient receiving extracorporeal membrane oxygenation (ECMO)    arformoterol  15 mcg Nebulization BID   artificial tears  1 Application Both Eyes Q8H   Chlorhexidine  Gluconate Cloth  6 each Topical Daily   clonazePAM  1 mg Per Tube Q8H   feeding supplement (PROSource TF20)  60 mL Per Tube QID   free water   200 mL Per Tube Q6H   gabapentin   600 mg Per Tube Daily   And   gabapentin   1,200 mg Per Tube QHS   insulin aspart  0-15 Units Subcutaneous Q4H   mupirocin  ointment  1 Application Nasal BID   OLANZapine   5 mg Intravenous Daily   mouth rinse  15 mL Mouth Rinse Q2H   oxyCODONE   10 mg Per Tube QID   pantoprazole  (PROTONIX ) IV  40 mg Intravenous QHS   polyethylene glycol  17 g Per Tube Daily   revefenacin  175 mcg Nebulization Daily   senna-docusate  2 tablet Per Tube QHS   sodium chloride  flush  10-40 mL Intracatheter Q12H   sodium chloride  flush  10-40 mL Intracatheter Q12H    SUBJECTIVE: Intubated, non-responsive   Review of Systems: ROS  Allergies  Allergen Reactions   Asenapine Other (See Comments) and Nausea And Vomiting    Increased tremors   Dextromethorphan Hbr    Guaifenesin     Latuda  [Lurasidone Hcl] Other (See Comments)    Tremors     Lurasidone Other (See Comments) and Hives    Other Reaction(s): Angioedema   Phenylephrine      OBJECTIVE: Vitals:   05/27/24 0830 05/27/24 0840 05/27/24 0845 05/27/24 0900  BP:      Pulse: 70 69 69 69  Resp: (!) 47 (!) 70 (!) 70 (!) 70  Temp: 98.2 F (36.8 C) 98.1 F (36.7 C) 98.1 F (36.7 C) 98.2 F (36.8 C)  TempSrc:      SpO2: 94% 96% 96% 96%  Weight:      Height:       Body mass index is 30.39 kg/m.  Physical Exam Constitutional:      Appearance: He is ill-appearing.  Cardiovascular:     Rate and Rhythm: Normal rate and regular rhythm.     Heart sounds: No murmur heard. Pulmonary:     Effort: Pulmonary effort is normal.     Breath sounds: Normal breath sounds.  Abdominal:     General: There is no distension.     Palpations: Abdomen is soft.  Skin:    Capillary Refill: Capillary refill takes less than 2 seconds.  Neurological:     Comments: Heavily sedated      Lab Results Lab Results  Component Value Date   WBC 21.5 (H) 05/27/2024   HGB 8.7 (L) 05/27/2024   HCT 28.1 (L) 05/27/2024   MCV 105.2 (H) 05/27/2024   PLT 209 05/27/2024    Lab Results  Component Value Date   CREATININE 0.65 05/27/2024   BUN 38 (H) 05/27/2024   NA 150 (H) 05/27/2024   K 4.7 05/27/2024   CL 113 (H) 05/27/2024   CO2 29 05/27/2024    Lab Results  Component Value Date   ALT 102 (H) 05/27/2024   AST 89 (H) 05/27/2024   ALKPHOS 146 (H) 05/27/2024   BILITOT 1.3 (H) 05/27/2024     Microbiology: Recent Results (from the past 240 hours)  Culture, Respiratory w Gram Stain     Status: None   Collection Time: 05/19/24 12:23 PM   Specimen: Bronchoalveolar Lavage; Respiratory  Result Value Ref Range Status   Specimen Description BRONCHIAL ALVEOLAR LAVAGE  Final   Special Requests NONE  Final   Gram Stain   Final    NO WBC SEEN ABUNDANT YEAST WITH PSEUDOHYPHAE Performed at [facility], [address]  [place], [place] [address]    Culture   Final    RARE PSEUDOMONAS AERUGINOSA MODERATE CANDIDA GLABRATA FEW CANDIDA TROPICALIS    Report Status 05/24/2024 FINAL  Final   Organism ID, Bacteria PSEUDOMONAS AERUGINOSA  Final      Susceptibility   Pseudomonas aeruginosa - MIC*    MEROPENEM 1 SENSITIVE  Sensitive     CIPROFLOXACIN  0.25 SENSITIVE Sensitive     IMIPENEM 1 SENSITIVE Sensitive     CEFEPIME 2 SENSITIVE Sensitive     CEFTAZIDIME/AVIBACTAM 2 SENSITIVE Sensitive     CEFTOLOZANE/TAZOBACTAM 1 SENSITIVE Sensitive     TOBRAMYCIN <=1 SENSITIVE Sensitive     CEFTAZIDIME 2 SENSITIVE Sensitive     * RARE PSEUDOMONAS AERUGINOSA  Culture, blood (Routine X 2) w Reflex to ID Panel     Status: Abnormal (Preliminary result)   Collection Time: 05/19/24  1:13 PM   Specimen: BLOOD RIGHT ARM  Result Value Ref Range Status   Specimen Description BLOOD RIGHT ARM  Final   Special Requests   Final    BOTTLES DRAWN AEROBIC AND ANAEROBIC Blood Culture adequate volume   Culture  Setup Time   Final    BUDDING YEAST SEEN AEROBIC BOTTLE ONLY CRITICAL RESULT CALLED TO, READ BACK BY AND VERIFIED WITH: PHARMD BLAKE WANNARAT ON 05/23/24 @ 1312 BY DRT    Culture (A)  Final    CANDIDA GLABRATA CULTURE REINCUBATED FOR BETTER GROWTH Performed at [facility], [address], [place], [place] [address]    Report Status PENDING  Incomplete  Culture, blood (Routine X 2) w Reflex to ID Panel     Status: None   Collection Time: 05/19/24  1:13 PM   Specimen: BLOOD LEFT ARM  Result Value Ref Range Status   Specimen Description BLOOD LEFT ARM  Final   Special Requests   Final    BOTTLES DRAWN AEROBIC AND ANAEROBIC Blood Culture results may not be optimal due to an inadequate volume of blood received in culture bottles   Culture   Final    NO GROWTH 5 DAYS Performed at [facility], [address], [place], [place] [address]    Report Status 05/24/2024 FINAL  Final  Blood Culture ID Panel (Reflexed)      Status: Abnormal   Collection Time: 05/19/24  1:13 PM  Result Value Ref Range Status   Enterococcus faecalis NOT DETECTED NOT DETECTED Final   Enterococcus Faecium NOT DETECTED NOT DETECTED Final   Listeria monocytogenes NOT DETECTED NOT DETECTED Final   Staphylococcus species NOT DETECTED NOT DETECTED Final   Staphylococcus aureus (BCID) NOT DETECTED NOT DETECTED Final   Staphylococcus epidermidis NOT DETECTED NOT DETECTED Final   Staphylococcus lugdunensis NOT DETECTED NOT DETECTED Final   Streptococcus species NOT DETECTED NOT DETECTED Final   Streptococcus agalactiae NOT DETECTED NOT DETECTED Final   Streptococcus pneumoniae NOT DETECTED NOT DETECTED Final   Streptococcus pyogenes NOT DETECTED NOT DETECTED Final   A.calcoaceticus-baumannii NOT DETECTED NOT DETECTED Final   Bacteroides fragilis NOT DETECTED NOT DETECTED Final   Enterobacterales NOT DETECTED NOT DETECTED Final   Enterobacter cloacae complex NOT DETECTED NOT DETECTED Final   Escherichia coli NOT DETECTED NOT DETECTED Final   Klebsiella aerogenes NOT DETECTED NOT DETECTED Final   Klebsiella oxytoca NOT DETECTED NOT DETECTED Final   Klebsiella pneumoniae NOT DETECTED NOT DETECTED Final   Proteus species NOT DETECTED NOT DETECTED Final   Salmonella species NOT DETECTED NOT DETECTED Final   Serratia marcescens NOT DETECTED NOT DETECTED Final   Haemophilus influenzae NOT DETECTED NOT DETECTED Final   Neisseria meningitidis NOT DETECTED NOT DETECTED Final   Pseudomonas aeruginosa NOT DETECTED NOT DETECTED Final   Stenotrophomonas maltophilia NOT DETECTED NOT DETECTED Final   Candida albicans NOT DETECTED NOT DETECTED Final   Candida auris NOT DETECTED NOT DETECTED Final  Candida glabrata DETECTED (A) NOT DETECTED Final    Comment: CRITICAL RESULT CALLED TO, READ BACK BY AND VERIFIED WITH: PHARMD BLAKE WANNARAT ON 05/23/24 @ 1312 BY DRT    Candida krusei NOT DETECTED NOT DETECTED Final   Candida parapsilosis NOT  DETECTED NOT DETECTED Final   Candida tropicalis NOT DETECTED NOT DETECTED Final   Cryptococcus neoformans/gattii NOT DETECTED NOT DETECTED Final    Comment: Performed at [facility], [address] [place], [place] [address]  Culture, Respiratory w Gram Stain     Status: None   Collection Time: 05/22/24  6:45 PM   Specimen: Bronchoalveolar Lavage; Respiratory  Result Value Ref Range Status   Specimen Description BRONCHIAL ALVEOLAR LAVAGE  Final   Special Requests NONE  Final   Gram Stain   Final    FEW WBC PRESENT, PREDOMINANTLY PMN FEW BUDDING YEAST SEEN Performed at [facility], [address] [place], [place] [address]    Culture ABUNDANT CANDIDA TROPICALIS  Final   Report Status 05/24/2024 FINAL  Final  Acid Fast Smear (AFB)     Status: None   Collection Time: 05/22/24  6:45 PM   Specimen: Bronchial Alveolar Lavage; Respiratory  Result Value Ref Range Status   AFB Specimen Processing Concentration  Final   Acid Fast Smear Negative  Final    Comment: (NOTE) Performed At: [facility] [address] [place], [place] [id number] Jennette Shorter MD Ey:[id number]    Source (AFB) BRONCHIAL ALVEOLAR LAVAGE  Final    Comment: Performed at [facility], [address] [place], [place] [address]  Pneumocystis smear by DFA     Status: None   Collection Time: 05/22/24  6:45 PM   Specimen: Bronchoalveolar Lavage; Respiratory  Result Value Ref Range Status   Specimen Source-PJSRC BRONCHIAL ALVEOLAR LAVAGE  Final   Pneumocystis jiroveci Ag See Scanned report in [place] Link  Final    Comment: Performed at [facility], [address] [place], [place] [address]  Culture, blood (Routine X 2) w Reflex to ID Panel     Status: None (Preliminary result)   Collection Time: 05/23/24 10:02 PM   Specimen: BLOOD RIGHT HAND  Result Value Ref Range Status   Specimen Description BLOOD RIGHT HAND  Final   Special Requests   Final    BOTTLES DRAWN AEROBIC AND  ANAEROBIC Blood Culture results may not be optimal due to an inadequate volume of blood received in culture bottles   Culture   Final    NO GROWTH 4 DAYS Performed at [facility], [address] [place], [place] [address]    Report Status PENDING  Incomplete  Culture, blood (Routine X 2) w Reflex to ID Panel     Status: None (Preliminary result)   Collection Time: 05/23/24 10:04 PM   Specimen: BLOOD RIGHT HAND  Result Value Ref Range Status   Specimen Description BLOOD RIGHT HAND  Final   Special Requests   Final    BOTTLES DRAWN AEROBIC ONLY Blood Culture results may not be optimal due to an inadequate volume of blood received in culture bottles   Culture   Final    NO GROWTH 4 DAYS Performed at [facility], [address] [place], [place] [address]    Report Status PENDING  Incomplete  Culture, blood (Routine X 2) w Reflex to ID Panel     Status: None (Preliminary result)   Collection Time: 05/24/24  6:55 AM   Specimen: BLOOD LEFT ARM  Result Value Ref Range Status   Specimen Description BLOOD LEFT ARM  Final   Special Requests   Final    BOTTLES DRAWN AEROBIC ONLY Blood Culture results may not be optimal due to an inadequate volume of blood received in culture bottles   Culture   Final    NO GROWTH 3 DAYS Performed at [facility], [address] [place], [place] [address]    Report Status PENDING  Incomplete  Culture, blood (Routine X 2) w Reflex to ID Panel     Status: None (Preliminary result)   Collection Time: 05/24/24  6:55 AM   Specimen: BLOOD LEFT ARM  Result Value Ref Range Status   Specimen Description BLOOD LEFT ARM  Final   Special Requests   Final    BOTTLES DRAWN AEROBIC ONLY Blood Culture results may not be optimal due to an inadequate volume of blood received in culture bottles   Culture   Final    NO GROWTH 3 DAYS Performed at [facility], [address] [place], [place] [address]    Report Status PENDING  Incomplete   Surgical PCR screen     Status: None   Collection Time: 05/26/24  8:05 PM   Specimen: Nasal Mucosa; Nasal Swab  Result Value Ref Range Status   MRSA, PCR NEGATIVE NEGATIVE Final   Staphylococcus aureus NEGATIVE NEGATIVE Final    Comment: (NOTE) The Xpert SA Assay (FDA approved for NASAL specimens in patients 39 years of age and older), is one component of a comprehensive surveillance program. It is not intended to diagnose infection nor to guide or monitor treatment. Performed at [facility], [address], [place], [place] [address]     Shantinique Picazo, MSN, NP-C [facility] Pager: [phone]  @TODAY @ 9:30 AM   Total Encounter Time: 42m

## 2024-05-27 NOTE — CV Procedure (Signed)
 ECMO NOTE:   Indication: Respiratory failure due to ARDS   Initial cannulation date: 05/22/24   ECMO type: VV ECMO   Dual lumen inflow/return cannula:   1) 65F [place] placed in RIJ    Daily data:   Flow 4.21L RPM 3400 [address]  8L Pven -91   Labs:   ABG    Component Value Date/Time   PHART 7.389 05/27/2024 1629   PCO2ART 47.6 05/27/2024 1629   PO2ART 52 (L) 05/27/2024 1629   HCO3 28.8 (H) 05/27/2024 1629   TCO2 30 05/27/2024 1629   ACIDBASEDEF 1.0 05/19/2024 1154   O2SAT 86 05/27/2024 1629    Hgb 8.7 Platelets 209 LDH 405 PTT 37, bival held this morning Lactic acid 0.6  Plan:   Continue VV ECMO support Await lung recovery, expect prolonged course Bival off with plan for trach today Continue abx/antifungals Start propofol  to assist with getting off sedation Nitric oxide through the circuit Wean paralytics today Restart anticoagulation tomorrow Worsening fibrin buildup at 12   Morene JINNY Brownie, MD  5:11 PM

## 2024-05-27 NOTE — Progress Notes (Signed)
 Cortrak Tube Team Note:  RD received message that Cortrak coiled in mouth post trach with ENT in OR.   RD re-positioned tube successfully; bridled now at 80 cm.   X-ray is required.  RN may begin using tube once placement confirmed.   If the tube becomes dislodged please keep the tube and contact the Cortrak team at [email or web address] for replacement.  If after hours and replacement cannot be delayed, place a NG tube and confirm placement with an abdominal x-ray.    Betsey Finger MS, RDN, LDN, CNSC Registered Dietitian 3 Clinical Nutrition RD Inpatient Contact Info in Amion

## 2024-05-27 NOTE — Progress Notes (Signed)
 5 Days Post-Op Procedure(s) (LRB): ECMO CANNULATION (N/A) Subjective: Intubated, sedated, paralyzed  Objective: Vital signs in last 24 hours: Temp:  [97.4 F (36.3 C)-98.8 F (37.1 C)] 98.1 F (36.7 C) (11/03 0615) Pulse Rate:  [60-83] 70 (11/03 0615) Cardiac Rhythm: Normal sinus rhythm (11/02 2000) Resp:  [15-82] 15 (11/03 0615) BP: (96-113)/(58-68) 96/68 (11/03 0400) SpO2:  [91 %-100 %] 95 % (11/03 0615) Arterial Line BP: (102-163)/(39-66) 121/50 (11/03 0615) FiO2 (%):  [100 %] 100 % (11/03 0317)  Hemodynamic parameters for last 24 hours: CVP:  [0 mmHg-50 mmHg] 0 mmHg  Intake/Output from previous day: 11/02 0701 - 11/03 0700 In: 4007.4 [I.V.:1560.4; NG/GT:1980; IV Piggyback:467] Out: 3515 [Urine:3475; Chest Tube:40] Intake/Output this shift: No intake/output data recorded.  General appearance: ill- appearing Heart: regular rate and rhythm Lungs: minimal air movement Abdomen: normal findings: soft + BS  Lab Results: Recent Labs    05/26/24 1556 05/26/24 1631 05/27/24 0432 05/27/24 0433  WBC 20.6*  --   --  21.5*  HGB 8.9*   < > 7.8* 8.7*  HCT 28.6*   < > 23.0* 28.1*  PLT 250  --   --  209   < > = values in this interval not displayed.   BMET:  Recent Labs    05/26/24 1556 05/26/24 1631 05/27/24 0432 05/27/24 0433  NA 151*   < > 150* 150*  K 4.8   < > 4.8 4.7  CL 114*  --   --  113*  CO2 29  --   --  29  GLUCOSE 129*  --   --  108*  BUN 39*  --   --  38*  CREATININE 0.70  --   --  0.65  CALCIUM  8.2*  --   --  8.3*   < > = values in this interval not displayed.    PT/INR:  Recent Labs    05/27/24 0433  LABPROT 16.6*  INR 1.3*   ABG    Component Value Date/Time   PHART 7.402 05/27/2024 0432   HCO3 28.8 (H) 05/27/2024 0432   TCO2 30 05/27/2024 0432   ACIDBASEDEF 1.0 05/19/2024 1154   O2SAT 83 05/27/2024 0432   CBG (last 3)  Recent Labs    05/26/24 2001 05/27/24 0024 05/27/24 0432  GLUCAP 146* 112* 116*    Assessment/Plan: S/P  Procedure(s) (LRB): ECMO CANNULATION (N/A) Remains critically ill NEURO- requiring multiple agent for sedation  Unable to stop paralytics due to flow issues with ECMO CV- in Sr, CVP low RESP- VDRF, on VV ECMO  CXR unchanged- dense opacities bilaterally, space on R  No air leak  Minimal vent support with tidal volume of 100   Candida in sputum ID- Candida glabrata in sputum and blood  On micafungin day 6, meropenem day 6 RENAL- creatinine normal  Hyponatremia- getting free water  via tube ENDO- CBG well controlled Gi- tolerating TF DVT- on bivalrudin for ECMO- on hold for trach later today  LOS: 18 days    [name] 05/27/2024

## 2024-05-27 NOTE — Progress Notes (Signed)
 Advanced Heart Failure Rounding Note   Subjective:     Placed on VV ECMO day 05/22/2024  Continues on nimbex at 3, versed  at 15, diluadid at 4  Hopeful to wean paralytics today. Discussed with CCM, will start propofol  to assist with sedation. Decreased vent to 60%.   TEE for cannula position given persistent hypoxemia, fungemia.  Fibrin clot worsening, especially at 12   Objective:   Weight Range:  Vital Signs:   Temp:  [98.1 F (36.7 C)-98.6 F (37 C)] 98.2 F (36.8 C) (11/03 1700) Pulse Rate:  [64-81] 66 (11/03 1700) Resp:  [0-82] 0 (11/03 1700) BP: (96-106)/(62-69) 106/69 (11/03 1200) SpO2:  [81 %-99 %] 86 % (11/03 1700) Arterial Line BP: (102-145)/(47-59) 119/49 (11/03 1700) FiO2 (%):  [60 %-100 %] 100 % (11/03 1610) Weight:  [108.5 kg] 108.5 kg (11/03 0710) Last BM Date : 05/26/24  Weight change: Filed Weights   05/25/24 0700 05/26/24 0630 05/27/24 0710  Weight: 107 kg 110.3 kg 108.5 kg    Intake/Output:   Intake/Output Summary (Last 24 hours) at 05/27/2024 1714 Last data filed at 05/27/2024 1700 Gross per 24 hour  Intake 3674.4 ml  Output 3395 ml  Net 279.4 ml     Physical Exam: General:  intubated/sedated/paralyzed HEENT: + ETT Neck: supple. RIJ ECMO cannula. Retracted and redressed during TEE Cor: Regular rate & rhythm.  Lungs: diminished Abdomen: soft, nontender, nondistended.Good bowel sounds. Extremities: no cyanosis, clubbing, rash, edema Neuro:sedated/paralyzed    Labs: Basic Metabolic Panel: Recent Labs  Lab 05/21/24 0421 05/21/24 1424 05/22/24 0451 05/22/24 0746 05/23/24 0445 05/23/24 0453 05/24/24 0401 05/24/24 0408 05/25/24 0411 05/25/24 0417 05/25/24 1604 05/25/24 1607 05/26/24 0450 05/26/24 0802 05/26/24 1556 05/26/24 1631 05/27/24 0026 05/27/24 0432 05/27/24 0433 05/27/24 1203 05/27/24 1629  NA 139   < > 143   < > 146*   < > 147*   < > 148*   < > 148*   < > 148*   < > 151*   < > 150* 150* 150* 151* 151*  K  4.7   < > 4.8   < > 5.3*   < > 5.1   < > 4.9   < > 4.8   < > 4.7   < > 4.8   < > 5.0 4.8 4.7 4.8 4.8  CL 100  --  100   < > 106   < > 107   < > 111  --  113*  --  112*  --  114*  --   --   --  113*  --   --   CO2 29  --  29   < > 29   < > 29   < > 29  --  27  --  28  --  29  --   --   --  29  --   --   GLUCOSE 161*  --  134*   < > 113*   < > 109*   < > 124*  --  143*  --  115*  --  129*  --   --   --  108*  --   --   BUN 48*  --  51*   < > 46*   < > 50*   < > 48*  --  47*  --  42*  --  39*  --   --   --  38*  --   --  CREATININE 1.12  --  0.98   < > 0.88   < > 0.84   < > 0.85  --  0.75  --  0.71  --  0.70  --   --   --  0.65  --   --   CALCIUM  7.6*  --  7.9*   < > 7.5*   < > 8.2*   < > 8.2*  --  8.3*  --  8.1*  --  8.2*  --   --   --  8.3*  --   --   MG 3.3*  --  3.1*  --  3.1*  --  3.0*  --  2.5*  --   --   --  2.3  --   --   --   --   --   --   --   --   PHOS 3.2  --  3.2  --  3.1  --  3.7  --   --   --   --   --   --   --   --   --   --   --   --   --   --    < > = values in this interval not displayed.    Liver Function Tests: Recent Labs  Lab 05/23/24 0445 05/24/24 0401 05/25/24 0411 05/26/24 0450 05/27/24 0433  AST 31 56* 57* 117* 89*  ALT 23 50* 59* 84* 102*  ALKPHOS 79 92 92 111 146*  BILITOT 0.5 0.7 0.5 0.8 1.3*  PROT 5.2* 5.3* 5.2* 5.1* 5.1*  ALBUMIN  1.8* 1.9* 2.1* 1.9* 1.8*   No results for input(s): LIPASE, AMYLASE in the last 168 hours. No results for input(s): AMMONIA in the last 168 hours.  CBC: Recent Labs  Lab 05/23/24 0445 05/23/24 0453 05/24/24 0401 05/24/24 0408 05/25/24 0411 05/25/24 0417 05/25/24 1604 05/25/24 1607 05/26/24 0450 05/26/24 0802 05/26/24 1556 05/26/24 1631 05/27/24 0026 05/27/24 0432 05/27/24 0433 05/27/24 1203 05/27/24 1629  WBC 26.7*   < > 21.2*   < > 21.0*  --  22.6*  --  24.2*  --  20.6*  --   --   --  21.5*  --   --   NEUTROABS 18.6*  --  15.5*  --  15.9*  --   --   --  18.7*  --   --   --   --   --  16.0*  --   --    HGB 8.9*   < > 9.1*   < > 9.0*   < > 9.6*   < > 8.9*   < > 8.9*   < > 7.8* 7.8* 8.7* 8.2* 8.2*  HCT 28.0*   < > 29.2*   < > 29.6*   < > 30.8*   < > 28.8*   < > 28.6*   < > 23.0* 23.0* 28.1* 24.0* 24.0*  MCV 102.2*   < > 104.3*   < > 105.0*  --  105.5*  --  104.0*  --  105.5*  --   --   --  105.2*  --   --   PLT 263   < > 248   < > 285  --  269  --  268  --  250  --   --   --  209  --   --    < > = values in this interval not displayed.  Cardiac Enzymes: No results for input(s): CKTOTAL, CKMB, CKMBINDEX, TROPONINI in the last 168 hours.  BNP: BNP (last 3 results) Recent Labs    05/30/23 1059 03/20/24 1419  BNP 34.3 20.6    ProBNP (last 3 results) No results for input(s): PROBNP in the last 8760 hours.      Medications:     Scheduled Medications:  arformoterol  15 mcg Nebulization BID   artificial tears  1 Application Both Eyes Q8H   Chlorhexidine  Gluconate Cloth  6 each Topical Daily   clonazePAM  1 mg Per Tube Q8H   feeding supplement (PROSource TF20)  60 mL Per Tube QID   free water   200 mL Per Tube Q6H   gabapentin   600 mg Per Tube Daily   And   gabapentin   1,200 mg Per Tube QHS   insulin aspart  0-15 Units Subcutaneous Q4H   mupirocin  ointment  1 Application Nasal BID   OLANZapine   5 mg Intravenous Daily   mouth rinse  15 mL Mouth Rinse Q2H   oxyCODONE   10 mg Per Tube QID   pantoprazole  (PROTONIX ) IV  40 mg Intravenous QHS   polyethylene glycol  17 g Per Tube Daily   revefenacin  175 mcg Nebulization Daily   senna-docusate  2 tablet Per Tube QHS   sodium chloride  flush  10-40 mL Intracatheter Q12H   sodium chloride  flush  10-40 mL Intracatheter Q12H    Infusions:  albumin  human 60 mL/hr at 05/27/24 1500   cisatracurium (NIMBEX) 200 mg in sodium chloride  0.9 % 100 mL (2 mg/mL) infusion 3 mcg/kg/min (05/27/24 1500)   feeding supplement (VITAL 1.5 CAL) 60 mL/hr at 05/27/24 1500   HYDROmorphone  4.5 mg/hr (05/27/24 1500)   ketamine  (KETALAR ) adult  infusion 1 mg/kg/hr (05/27/24 1500)   meropenem (MERREM) IV Stopped (05/27/24 1409)   micafungin (MYCAMINE) 200 mg in sodium chloride  0.9 % 100 mL IVPB Stopped (05/26/24 1810)   midazolam  16 mg/hr (05/27/24 1500)   niCARDipine Stopped (05/24/24 2221)   norepinephrine (LEVOPHED) Adult infusion Stopped (05/26/24 2306)   propofol  (DIPRIVAN ) infusion 20 mcg/kg/min (05/27/24 1542)    PRN Medications: albumin  human, hydrALAZINE , HYDROmorphone , hydrOXYzine , ipratropium-albuterol , lactulose, midazolam , ondansetron  (ZOFRAN ) IV, mouth rinse, mouth rinse, sodium chloride  flush, sodium chloride  flush   Assessment/Plan:   1. 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 Hold anticoagulation tonight with trach today - Oxygenation remains marginal, TEE for adjustment today - TV >50mL - CXR largely unchanged - CCM managing vent and R chest tube - Trach today, appreciate ENT - Add propofol  to help come off paralytic - Start nitric through the circuit given clot seen - continue abx. No need for diuretics - Transfuse hgb < 7.5  2. RUL lung CA s/p lobectomy 3. COPD with ongoing tobacco use 4. Bipolar d/o 5. CAD 6. Candida glabrata fungemia/PNA - ID following - on micafungin for fungemia, ID following, course to be determined given indwelling lines - steroids off - Continue meropenem for 14 day course  7. HTN - ensure adequate sedation under paralytics - add cardene as needed  Remains critically ill with ARDS/fungal PNA/PTX on top of severe underlying lung disease. Long run expected CRITICAL CARE Performed by: Morene JINNY Brownie   Total critical care time: 50 minutes  Critical care time was exclusive of separately billable procedures and treating other patients.  Critical care was necessary to treat or prevent imminent  or life-threatening deterioration.  Critical  care was time spent personally by me on the following activities: development of treatment plan with patient and/or surrogate as well as nursing, discussions with consultants, evaluation of patient's response to treatment, examination of patient, obtaining history from patient or surrogate, ordering and performing treatments and interventions, ordering and review of laboratory studies, ordering and review of radiographic studies, pulse oximetry and re-evaluation of patient's condition.

## 2024-05-27 NOTE — Progress Notes (Addendum)
 NAME:  Keith Gibbs, MRN:  [id number], DOB:  [birth year], LOS: 18 ADMISSION DATE:  05/09/2024, CONSULTATION DATE: 05/14/2024 REFERRING MD: CTS, CHIEF COMPLAINT: Acute on chronic hypoxic respiratory failure  History of Present Illness:  63 yo male presented for lobectomy with CTS. Progressing as expected post operatively with CT in place and was on 4E when this morning he decompensated with worsening hypoxemia and increased wob. CTS transferred to ICU and requested CCM evaluation.   Patient went into ARDS and eventually cannulated for VV ECMO.  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  He was difficult to oxygenate and ventilate therefore APRV started.  Patient seems to be tolerating the APRV. 10/29 -nitric oxide is weaned off overnight.  It has not helped.  It did not change his respiratory status.  Patient continues to have ARDS with bilateral pulmonary infiltrates.  At this stage we decided to proning.  Chest tube is hooked up to suction. 10/29 -patient's oxygenation improved with proning but ventilation got worse.  At this stage given the ARDS a collective decision is made to cannulate the patient for VV ECMO.  Family was in agreement. 10/30 -blood cultures from 05/19/2024 and BAL cultures from 05/19/2024 grew Candida glabrata 10/31 - 11/2 unable to get the patient off paralytics   Interim History / Subjective:  Remains unable to wean off paralytics. Planning for tracheostomy today. Remains with low PaO2 in the 40's. Does have clot in ECMO circuit.   Objective    Blood pressure 106/69, pulse 71,  temperature 98.6 F (37 C), resp. rate (!) 69, height 6' 3 (1.905 m), weight 108.5 kg, SpO2 (!) 83%. CVP:  [0 mmHg-50 mmHg] 8 mmHg  Vent Mode: PCV FiO2 (%):  [60 %-100 %] 60 % Set Rate:  [15 bmp] 15 bmp PEEP:  [10 cmH20] 10 cmH20 Plateau Pressure:  [17 cmH20-19 cmH20] 19 cmH20   Intake/Output Summary (Last 24 hours) at 05/27/2024 1422 Last data filed at 05/27/2024 1300 Gross per 24 hour  Intake 3944.28 ml  Output 3460 ml  Net 484.28 ml   Filed Weights   05/25/24 0700 05/26/24 0630 05/27/24 0710  Weight: 107 kg 110.3 kg 108.5 kg    Examination: General: critically ill appearing male, intubated, sedated and paralyzed HEENT: pupils equal and sluggish, sclera anicteric, R internal jugular crescent cannula in place  Lungs: on mechanical ventilation, diminished breath sounds throughout  Cardiovascular: regular rate and rhythm, normal S1 and S2, no m/r/g Abdomen: soft, non-distended Extremities: trace edema, warm, dry  Neuro: sedated and paralyzed and does not tolerate weaning   VV ECMO Flow: 4.33 L/min Delta P: 29 Sweep: 8 L/min   Resolved problem list   Assessment and Plan   Acute hypoxic and hypercapnic respiratory failure ARDS RUL adenocarcinoma s/p right upper lobectomy History of COPD Acute pulmonary edema Possible aspiration Candida glabrata and pseudomonas pneumonia  On the night of 10/21 POD4 patient developed respiratory distress and ultimately required intubation 10/26. On 10/29 patient was cannulated for VV ECMO via R internal jugular crescent cannula. TEE at bedside 11/3 with crescent cannula pulled back.  - Continue to monitor daily CXR, intermittent ABG  -  Continue rest settings on ventilator  - Plan for tracheostomy today with ENT  - Adjust sweep as needed to maintain PCO2 35-45  - R chest tube is to waterseal without air leak, continue to monitor output  - There is clot burden in the ECMO circuit, will start nitric oxide via circuit. Currently with good  blood color change but can consider pre- and post-oxygenator gases - PRN Duonebs  - Holding bivalrudin for tracheostomy placement   Angalosedation History of anxiety, schizoaffective disorder bipolar type  - Continue nimbex infusion, will add propofol  and attempt to wean nimbex infusion following tracheostomy - Continue versed , ketamine  and dilaudid  infusions for BIS goal 40-60 - Continue PO klonopin and oxycodone   - Discontinue seroquel  and add IV olanzapine . Monitor response to olanzapine  and consider increasing to BID  - Continue PRN zyprexa   - Holding home celecoxib , Cymbalta , and depakote    Leukocytosis, improving  Septic shock, improved  Candida glabrata pneumonia  Fungemic with candida glabrata  Pseudomonas pneumonia  BAL 10/26 with rare pseudomonas and candida glabrata. Blood cultures 10/26 with candida glabrata. Blood cultures 10/30 and 10/31 no growth to date. Remains off vasopressors. - Continue to follow-up blood cultures  - ID following, appreciate recommendations  - Continue meropenem, plan for 14 day course  - Continue micafungin   AKI, resolved  Hypernatremia - Monitor renal functions and urine output - Monitor electrolytes and replace accordingly - Add FWF when able to resume tube feeds and continue to monitor Na  RLE DVT  BLE duplex 10/27 with acute intramuscular thrombosis involving the right gastrocnemius veins.  - Continue bivalirudin  per ECMO protocol  Anemia in the setting of critical illness and acute blood loss Hgb stable at 8.2. No active signs of bleeding. LDH stable 405.  - Continue to monitor daily CBC   Malnutrition  - Tube feeds held for tracheostomy, advance post-operatively   GI prophylaxis: continue protoxin DVT prophylaxis: on bival infusion  Labs   CBC: Recent Labs  Lab 05/23/24 0445 05/23/24 0453 05/24/24 0401 05/24/24 0408 05/25/24 0411 05/25/24 0417 05/25/24 1604 05/25/24 1607 05/26/24 0450 05/26/24 0802 05/26/24 1556  05/26/24 1631 05/26/24 2002 05/27/24 0026 05/27/24 0432 05/27/24 0433 05/27/24 1203  WBC 26.7*   < > 21.2*   < > 21.0*  --  22.6*  --  24.2*  --  20.6*  --   --   --   --  21.5*  --   NEUTROABS 18.6*  --  15.5*  --  15.9*  --   --   --  18.7*  --   --   --   --   --   --  16.0*  --   HGB 8.9*   < > 9.1*   < > 9.0*   < > 9.6*   < > 8.9*   < > 8.9*   < > 8.5* 7.8* 7.8* 8.7* 8.2*  HCT 28.0*   < > 29.2*   < > 29.6*   < > 30.8*   < > 28.8*   < > 28.6*   < > 25.0* 23.0* 23.0* 28.1* 24.0*  MCV 102.2*   < > 104.3*   < > 105.0*  --  105.5*  --  104.0*  --  105.5*  --   --   --   --  105.2*  --   PLT 263   < > 248   < > 285  --  269  --  268  --  250  --   --   --   --  209  --    < > = values in this interval not displayed.    Basic Metabolic Panel: Recent Labs  Lab 05/21/24 0421 05/21/24 1424 05/22/24 0451 05/22/24 0746 05/23/24 0445 05/23/24 0453 05/24/24 0401 05/24/24 0408 05/25/24 0411 05/25/24 0417 05/25/24 1604 05/25/24 1607 05/26/24 0450 05/26/24 0802 05/26/24 1556 05/26/24 1631 05/26/24 2002 05/27/24 0026 05/27/24 0432 05/27/24 0433 05/27/24 1203  NA 139   < > 143   < > 146*   < > 147*   < > 148*   < > 148*   < > 148*   < > 151*   < > 150* 150* 150* 150* 151*  K 4.7   < > 4.8   < > 5.3*   < > 5.1   < > 4.9   < > 4.8   < > 4.7   < > 4.8   < > 4.7 5.0 4.8 4.7 4.8  CL 100  --  100   < > 106   < > 107   < > 111  --  113*  --  112*  --  114*  --   --   --   --  113*  --   CO2 29  --  29   < > 29   < > 29   < > 29  --  27  --  28  --  29  --   --   --   --  29  --   GLUCOSE 161*  --  134*   < > 113*   < > 109*   < > 124*  --  143*  --  115*  --  129*  --   --   --   --  108*  --   BUN 48*  --  51*   < > 46*   < > 50*   < > 48*  --  47*  --  42*  --  39*  --   --   --   --  38*  --   CREATININE 1.12  --  0.98   < > 0.88   < > 0.84   < > 0.85  --  0.75  --  0.71  --  0.70  --   --   --   --  0.65  --   CALCIUM  7.6*  --  7.9*   < > 7.5*   < > 8.2*   < > 8.2*  --  8.3*  --  8.1*   --  8.2*  --   --   --   --  8.3*  --   MG 3.3*  --  3.1*  --  3.1*  --  3.0*  --  2.5*  --   --   --  2.3  --   --   --   --   --   --   --   --   PHOS 3.2  --  3.2  --  3.1  --  3.7  --   --   --   --   --   --   --   --   --   --   --   --   --   --    < > = values in this interval not displayed.   GFR: Estimated  Creatinine Clearance: 125.8 mL/min (by C-G formula based on SCr of 0.65 mg/dL). Recent Labs  Lab 05/25/24 1604 05/25/24 1647 05/26/24 0450 05/26/24 0548 05/26/24 0753 05/26/24 1210 05/26/24 1556 05/26/24 1627 05/27/24 0027 05/27/24 0433  WBC 22.6*  --  24.2*  --   --   --  20.6*  --   --  21.5*  LATICACIDVEN  --    < >  --    < > 0.7 0.5  --  0.6 0.6  --    < > = values in this interval not displayed.    Liver Function Tests: Recent Labs  Lab 05/23/24 0445 05/24/24 0401 05/25/24 0411 05/26/24 0450 05/27/24 0433  AST 31 56* 57* 117* 89*  ALT 23 50* 59* 84* 102*  ALKPHOS 79 92 92 111 146*  BILITOT 0.5 0.7 0.5 0.8 1.3*  PROT 5.2* 5.3* 5.2* 5.1* 5.1*  ALBUMIN  1.8* 1.9* 2.1* 1.9* 1.8*    No results for input(s): LIPASE, AMYLASE in the last 168 hours. No results for input(s): AMMONIA in the last 168 hours.  ABG    Component Value Date/Time   PHART 7.387 05/27/2024 1203   PCO2ART 47.4 05/27/2024 1203   PO2ART 46 (L) 05/27/2024 1203   HCO3 28.5 (H) 05/27/2024 1203   TCO2 30 05/27/2024 1203   ACIDBASEDEF 1.0 05/19/2024 1154   O2SAT 81 05/27/2024 1203     Coagulation Profile: Recent Labs  Lab 05/23/24 0445 05/24/24 0401 05/25/24 0411 05/26/24 0450 05/27/24 0433  INR 1.1 1.3* 1.7* 1.8* 1.3*    Cardiac Enzymes: No results for input(s): CKTOTAL, CKMB, CKMBINDEX, TROPONINI in the last 168 hours.  HbA1C: Hgb A1c MFr Bld  Date/Time Value Ref Range Status  02/12/2024 08:40 AM 5.3 4.8 - 5.6 % Final    Comment:             Prediabetes: 5.7 - 6.4          Diabetes: >6.4          Glycemic control for adults with diabetes: <7.0    04/26/2022 03:30 PM 5.2 4.8 - 5.6 % Final    Comment:    (NOTE) Pre diabetes:          5.7%-6.4%  Diabetes:              >6.4%  Glycemic control for   <7.0% adults with diabetes     CBG: Recent Labs  Lab 05/26/24 2001 05/27/24 0024 05/27/24 0432 05/27/24 0847 05/27/24 1201  GLUCAP 146* 112* 116* 107* 107*       The patient is critically ill with multiple organ system failure and requires high complexity decision making for assessment and support, frequent evaluation and titration of therapies, advanced monitoring, review of radiographic studies and interpretation of complex data.   Critical Care Time devoted to patient care services, exclusive of separately billable procedures, described in this note is 50 minutes.  Rexene LOISE Blush, PA-C [facility] 05/27/24 2:22 PM  Please see [email or web address] for pager details.  From 7A-7P if no response, please call [phone] After hours, please call ELink [phone]

## 2024-05-27 NOTE — Anesthesia Preprocedure Evaluation (Signed)
 Anesthesia Evaluation  Patient identified by MRN, date of birth, ID band Patient awake    Reviewed: Allergy  & Precautions, NPO status , Patient's Chart, lab work & pertinent test results  Airway Mallampati: Intubated  TM Distance: >3 FB     Dental no notable dental hx.    Pulmonary shortness of breath, COPD,  COPD inhaler, Patient abstained from smoking., former smoker AHRF following wedge resection that ultimately required VV ecmo   + rhonchi        Cardiovascular hypertension, Pt. on medications and Pt. on home beta blockers + CAD, + Past MI and + Cardiac Stents   Rhythm:Regular Rate:Normal     Neuro/Psych  PSYCHIATRIC DISORDERS Anxiety Depression Bipolar Disorder Schizophrenia   Neuromuscular disease CVA    GI/Hepatic PUD,GERD  ,,(+)     substance abuse  , Hepatitis -, C  Endo/Other  negative endocrine ROS    Renal/GU Renal disease     Musculoskeletal  (+) Arthritis ,    Abdominal   Peds  Hematology negative hematology ROS (+)   Anesthesia Other Findings   Reproductive/Obstetrics                              Anesthesia Physical Anesthesia Plan  ASA: 4  Anesthesia Plan: General   Post-op Pain Management:    Induction: Intravenous  PONV Risk Score and Plan: 2 and Dexamethasone , Ondansetron  and Treatment may vary due to age or medical condition  Airway Management Planned: Oral ETT and Tracheostomy  Additional Equipment:   Intra-op Plan:   Post-operative Plan: Extubation in OR  Informed Consent: I have reviewed the patients History and Physical, chart, labs and discussed the procedure including the risks, benefits and alternatives for the proposed anesthesia with the patient or authorized representative who has indicated his/her understanding and acceptance.     Dental advisory given  Plan Discussed with: CRNA  Anesthesia Plan Comments: (63 year old male who  underwent right upper lobectomy due to lung adenocarcinoma, on the night of 10/21 POD 4, patient went into respiratory distress has been requiring heated high flow and have been deteriorating requiring intubation on 10/26.  No high fevers today.  Current temperature is 99.7.  Several modes of ventilation including nitric oxide did not help.  Pulmonary did not help.  Eventually patient went on VV ECMO for ARDS management.)         Anesthesia Quick Evaluation

## 2024-05-27 NOTE — Transfer of Care (Signed)
 Immediate Anesthesia Transfer of Care Note  Patient: Keith Gibbs  Procedure(s) Performed: CREATION, TRACHEOSTOMY  Patient Location: ICU  Anesthesia Type:General  Level of Consciousness: Patient remains intubated per anesthesia plan  Airway & Oxygen Therapy: Patient remains intubated per anesthesia plan  Post-op Assessment: Report given to RN and Post -op Vital signs reviewed and stable  Post vital signs: Reviewed and stable  Last Vitals:  Vitals Value Taken Time  BP    Temp    Pulse 65 05/27/24 16:01  Resp 0 05/27/24 16:01  SpO2 87 % 05/27/24 16:01  Vitals shown include unfiled device data.  Last Pain:  Vitals:   05/26/24 1600  TempSrc:   PainSc: 0-No pain      Patients Stated Pain Goal: 0 (05/15/24 1600)  Complications: There were no known notable events for this encounter.

## 2024-05-28 ENCOUNTER — Encounter (HOSPITAL_COMMUNITY): Payer: Self-pay | Admitting: Otolaryngology

## 2024-05-28 ENCOUNTER — Inpatient Hospital Stay (HOSPITAL_COMMUNITY): Payer: MEDICAID

## 2024-05-28 ENCOUNTER — Inpatient Hospital Stay: Payer: MEDICAID | Admitting: Thoracic Surgery (Cardiothoracic Vascular Surgery)

## 2024-05-28 DIAGNOSIS — I10 Essential (primary) hypertension: Secondary | ICD-10-CM | POA: Diagnosis not present

## 2024-05-28 DIAGNOSIS — J80 Acute respiratory distress syndrome: Secondary | ICD-10-CM | POA: Diagnosis not present

## 2024-05-28 DIAGNOSIS — J9602 Acute respiratory failure with hypercapnia: Secondary | ICD-10-CM | POA: Diagnosis not present

## 2024-05-28 DIAGNOSIS — J939 Pneumothorax, unspecified: Secondary | ICD-10-CM | POA: Diagnosis not present

## 2024-05-28 DIAGNOSIS — J9601 Acute respiratory failure with hypoxia: Secondary | ICD-10-CM | POA: Diagnosis not present

## 2024-05-28 DIAGNOSIS — Z902 Acquired absence of lung [part of]: Secondary | ICD-10-CM | POA: Diagnosis not present

## 2024-05-28 DIAGNOSIS — B371 Pulmonary candidiasis: Secondary | ICD-10-CM | POA: Diagnosis not present

## 2024-05-28 LAB — POCT I-STAT 7, (LYTES, BLD GAS, ICA,H+H)
Acid-Base Excess: 2 mmol/L (ref 0.0–2.0)
Acid-Base Excess: 2 mmol/L (ref 0.0–2.0)
Acid-Base Excess: 0 mmol/L (ref 0.0–2.0)
Acid-Base Excess: 0 mmol/L (ref 0.0–2.0)
Acid-Base Excess: 0 mmol/L (ref 0.0–2.0)
Acid-Base Excess: 1 mmol/L (ref 0.0–2.0)
Acid-Base Excess: 0 mmol/L (ref 0.0–2.0)
Acid-base deficit: 1 mmol/L (ref 0.0–2.0)
Acid-base deficit: 2 mmol/L (ref 0.0–2.0)
Bicarbonate: 27.4 mmol/L (ref 20.0–28.0)
Bicarbonate: 27.8 mmol/L (ref 20.0–28.0)
Bicarbonate: 25.2 mmol/L (ref 20.0–28.0)
Bicarbonate: 24.4 mmol/L (ref 20.0–28.0)
Bicarbonate: 26.5 mmol/L (ref 20.0–28.0)
Bicarbonate: 23.4 mmol/L (ref 20.0–28.0)
Bicarbonate: 23.8 mmol/L (ref 20.0–28.0)
Bicarbonate: 27.5 mmol/L (ref 20.0–28.0)
Bicarbonate: 24.1 mmol/L (ref 20.0–28.0)
Calcium, Ion: 1.22 mmol/L (ref 1.15–1.40)
Calcium, Ion: 1.21 mmol/L (ref 1.15–1.40)
Calcium, Ion: 1.19 mmol/L (ref 1.15–1.40)
Calcium, Ion: 1.18 mmol/L (ref 1.15–1.40)
Calcium, Ion: 1.21 mmol/L (ref 1.15–1.40)
Calcium, Ion: 1.19 mmol/L (ref 1.15–1.40)
Calcium, Ion: 1.14 mmol/L — ABNORMAL LOW (ref 1.15–1.40)
Calcium, Ion: 1.17 mmol/L (ref 1.15–1.40)
Calcium, Ion: 1.18 mmol/L (ref 1.15–1.40)
HCT: 23 % — ABNORMAL LOW (ref 39.0–52.0)
HCT: 23 % — ABNORMAL LOW (ref 39.0–52.0)
HCT: 22 % — ABNORMAL LOW (ref 39.0–52.0)
HCT: 23 % — ABNORMAL LOW (ref 39.0–52.0)
HCT: 23 % — ABNORMAL LOW (ref 39.0–52.0)
HCT: 23 % — ABNORMAL LOW (ref 39.0–52.0)
HCT: 19 % — ABNORMAL LOW (ref 39.0–52.0)
HCT: 21 % — ABNORMAL LOW (ref 39.0–52.0)
HCT: 20 % — ABNORMAL LOW (ref 39.0–52.0)
Hemoglobin: 7.8 g/dL — ABNORMAL LOW (ref 13.0–17.0)
Hemoglobin: 7.8 g/dL — ABNORMAL LOW (ref 13.0–17.0)
Hemoglobin: 7.5 g/dL — ABNORMAL LOW (ref 13.0–17.0)
Hemoglobin: 7.8 g/dL — ABNORMAL LOW (ref 13.0–17.0)
Hemoglobin: 7.8 g/dL — ABNORMAL LOW (ref 13.0–17.0)
Hemoglobin: 7.8 g/dL — ABNORMAL LOW (ref 13.0–17.0)
Hemoglobin: 6.5 g/dL — CL (ref 13.0–17.0)
Hemoglobin: 7.1 g/dL — ABNORMAL LOW (ref 13.0–17.0)
Hemoglobin: 6.8 g/dL — CL (ref 13.0–17.0)
O2 Saturation: 87 %
O2 Saturation: 91 %
O2 Saturation: 85 %
O2 Saturation: 85 %
O2 Saturation: 74 %
O2 Saturation: 99 %
O2 Saturation: 100 %
O2 Saturation: 46 %
O2 Saturation: 86 %
Patient temperature: 37
Patient temperature: 37
Patient temperature: 37
Patient temperature: 36.9
Patient temperature: 37
Patient temperature: 37
Patient temperature: 37
Patient temperature: 37
Patient temperature: 36.4
Potassium: 4.4 mmol/L (ref 3.5–5.1)
Potassium: 4.2 mmol/L (ref 3.5–5.1)
Potassium: 3.8 mmol/L (ref 3.5–5.1)
Potassium: 3.8 mmol/L (ref 3.5–5.1)
Potassium: 4.2 mmol/L (ref 3.5–5.1)
Potassium: 4.4 mmol/L (ref 3.5–5.1)
Potassium: 4.2 mmol/L (ref 3.5–5.1)
Potassium: 4.3 mmol/L (ref 3.5–5.1)
Potassium: 4.1 mmol/L (ref 3.5–5.1)
Sodium: 151 mmol/L — ABNORMAL HIGH (ref 135–145)
Sodium: 151 mmol/L — ABNORMAL HIGH (ref 135–145)
Sodium: 153 mmol/L — ABNORMAL HIGH (ref 135–145)
Sodium: 153 mmol/L — ABNORMAL HIGH (ref 135–145)
Sodium: 153 mmol/L — ABNORMAL HIGH (ref 135–145)
Sodium: 156 mmol/L — ABNORMAL HIGH (ref 135–145)
Sodium: 151 mmol/L — ABNORMAL HIGH (ref 135–145)
Sodium: 152 mmol/L — ABNORMAL HIGH (ref 135–145)
Sodium: 153 mmol/L — ABNORMAL HIGH (ref 135–145)
TCO2: 29 mmol/L (ref 22–32)
TCO2: 29 mmol/L (ref 22–32)
TCO2: 27 mmol/L (ref 22–32)
TCO2: 26 mmol/L (ref 22–32)
TCO2: 28 mmol/L (ref 22–32)
TCO2: 25 mmol/L (ref 22–32)
TCO2: 25 mmol/L (ref 22–32)
TCO2: 29 mmol/L (ref 22–32)
TCO2: 25 mmol/L (ref 22–32)
pCO2 arterial: 44.8 mmHg (ref 32–48)
pCO2 arterial: 46.4 mmHg (ref 32–48)
pCO2 arterial: 44.1 mmHg (ref 32–48)
pCO2 arterial: 45.4 mmHg (ref 32–48)
pCO2 arterial: 54.5 mmHg — ABNORMAL HIGH (ref 32–48)
pCO2 arterial: 40.2 mmHg (ref 32–48)
pCO2 arterial: 32.7 mmHg (ref 32–48)
pCO2 arterial: 53.7 mmHg — ABNORMAL HIGH (ref 32–48)
pCO2 arterial: 35.7 mmHg (ref 32–48)
pH, Arterial: 7.394 (ref 7.35–7.45)
pH, Arterial: 7.386 (ref 7.35–7.45)
pH, Arterial: 7.365 (ref 7.35–7.45)
pH, Arterial: 7.338 — ABNORMAL LOW (ref 7.35–7.45)
pH, Arterial: 7.295 — ABNORMAL LOW (ref 7.35–7.45)
pH, Arterial: 7.374 (ref 7.35–7.45)
pH, Arterial: 7.318 — ABNORMAL LOW (ref 7.35–7.45)
pH, Arterial: 7.471 — ABNORMAL HIGH (ref 7.35–7.45)
pH, Arterial: 7.435 (ref 7.35–7.45)
pO2, Arterial: 54 mmHg — ABNORMAL LOW (ref 83–108)
pO2, Arterial: 62 mmHg — ABNORMAL LOW (ref 83–108)
pO2, Arterial: 52 mmHg — ABNORMAL LOW (ref 83–108)
pO2, Arterial: 53 mmHg — ABNORMAL LOW (ref 83–108)
pO2, Arterial: 44 mmHg — ABNORMAL LOW (ref 83–108)
pO2, Arterial: 151 mmHg — ABNORMAL HIGH (ref 83–108)
pO2, Arterial: 28 mmHg — CL (ref 83–108)
pO2, Arterial: 540 mmHg — ABNORMAL HIGH (ref 83–108)
pO2, Arterial: 48 mmHg — ABNORMAL LOW (ref 83–108)

## 2024-05-28 LAB — BASIC METABOLIC PANEL WITH GFR
Anion gap: 10 (ref 5–15)
Anion gap: 8 (ref 5–15)
BUN: 47 mg/dL — ABNORMAL HIGH (ref 8–23)
BUN: 51 mg/dL — ABNORMAL HIGH (ref 8–23)
CO2: 26 mmol/L (ref 22–32)
CO2: 26 mmol/L (ref 22–32)
Calcium: 8 mg/dL — ABNORMAL LOW (ref 8.9–10.3)
Calcium: 8.1 mg/dL — ABNORMAL LOW (ref 8.9–10.3)
Chloride: 113 mmol/L — ABNORMAL HIGH (ref 98–111)
Chloride: 118 mmol/L — ABNORMAL HIGH (ref 98–111)
Creatinine, Ser: 0.81 mg/dL (ref 0.61–1.24)
Creatinine, Ser: 0.88 mg/dL (ref 0.61–1.24)
GFR, Estimated: >60 mL/min (ref >60)
GFR, Estimated: >60 mL/min (ref >60)
Glucose, Bld: 134 mg/dL — ABNORMAL HIGH (ref 70–99)
Glucose, Bld: 136 mg/dL — ABNORMAL HIGH (ref 70–99)
Potassium: 4.4 mmol/L (ref 3.5–5.1)
Potassium: 4.3 mmol/L (ref 3.5–5.1)
Sodium: 149 mmol/L — ABNORMAL HIGH (ref 135–145)
Sodium: 152 mmol/L — ABNORMAL HIGH (ref 135–145)

## 2024-05-28 LAB — CBC
HCT: 27.5 % — ABNORMAL LOW (ref 39.0–52.0)
Hemoglobin: 8.6 g/dL — ABNORMAL LOW (ref 13.0–17.0)
MCH: 32.8 pg (ref 26.0–34.0)
MCHC: 31.3 g/dL (ref 30.0–36.0)
MCV: 105 fL — ABNORMAL HIGH (ref 80.0–100.0)
Platelets: 228 K/uL (ref 150–400)
RBC: 2.62 MIL/uL — ABNORMAL LOW (ref 4.22–5.81)
RDW: 15.7 % — ABNORMAL HIGH (ref 11.5–15.5)
WBC: 19.2 K/uL — ABNORMAL HIGH (ref 4.0–10.5)
nRBC: 0.6 % — ABNORMAL HIGH (ref 0.0–0.2)

## 2024-05-28 LAB — HEPATIC FUNCTION PANEL
ALT: 85 U/L — ABNORMAL HIGH (ref 0–44)
AST: 78 U/L — ABNORMAL HIGH (ref 15–41)
Albumin: 1.8 g/dL — ABNORMAL LOW (ref 3.5–5.0)
Alkaline Phosphatase: 180 U/L — ABNORMAL HIGH (ref 38–126)
Bilirubin, Direct: 2 mg/dL — ABNORMAL HIGH (ref 0.0–0.2)
Indirect Bilirubin: 0.8 mg/dL (ref 0.3–0.9)
Total Bilirubin: 2.8 mg/dL — ABNORMAL HIGH (ref 0.0–1.2)
Total Protein: 5 g/dL — ABNORMAL LOW (ref 6.5–8.1)

## 2024-05-28 LAB — CBC WITH DIFFERENTIAL/PLATELET
Abs Immature Granulocytes: 0.32 K/uL — ABNORMAL HIGH (ref 0.00–0.07)
Basophils Absolute: 0.1 K/uL (ref 0.0–0.1)
Basophils Relative: 0 %
Eosinophils Absolute: 1.4 K/uL — ABNORMAL HIGH (ref 0.0–0.5)
Eosinophils Relative: 7 %
HCT: 26.6 % — ABNORMAL LOW (ref 39.0–52.0)
Hemoglobin: 8.3 g/dL — ABNORMAL LOW (ref 13.0–17.0)
Immature Granulocytes: 2 %
Lymphocytes Relative: 10 %
Lymphs Abs: 2 K/uL (ref 0.7–4.0)
MCH: 32.5 pg (ref 26.0–34.0)
MCHC: 31.2 g/dL (ref 30.0–36.0)
MCV: 104.3 fL — ABNORMAL HIGH (ref 80.0–100.0)
Monocytes Absolute: 0.9 K/uL (ref 0.1–1.0)
Monocytes Relative: 4 %
Neutro Abs: 16.5 K/uL — ABNORMAL HIGH (ref 1.7–7.7)
Neutrophils Relative %: 77 %
Platelets: 217 K/uL (ref 150–400)
RBC: 2.55 MIL/uL — ABNORMAL LOW (ref 4.22–5.81)
RDW: 15.3 % (ref 11.5–15.5)
WBC: 21.2 K/uL — ABNORMAL HIGH (ref 4.0–10.5)
nRBC: 0.4 % — ABNORMAL HIGH (ref 0.0–0.2)

## 2024-05-28 LAB — GLUCOSE, CAPILLARY
Glucose-Capillary: 100 mg/dL — ABNORMAL HIGH (ref 70–99)
Glucose-Capillary: 110 mg/dL — ABNORMAL HIGH (ref 70–99)
Glucose-Capillary: 116 mg/dL — ABNORMAL HIGH (ref 70–99)
Glucose-Capillary: 119 mg/dL — ABNORMAL HIGH (ref 70–99)
Glucose-Capillary: 132 mg/dL — ABNORMAL HIGH (ref 70–99)
Glucose-Capillary: 134 mg/dL — ABNORMAL HIGH (ref 70–99)
Glucose-Capillary: 135 mg/dL — ABNORMAL HIGH (ref 70–99)
Glucose-Capillary: 153 mg/dL — ABNORMAL HIGH (ref 70–99)

## 2024-05-28 LAB — CULTURE, BLOOD (ROUTINE X 2)
Culture: NO GROWTH
Culture: NO GROWTH

## 2024-05-28 LAB — POCT I-STAT EG7
Acid-base deficit: 1 mmol/L (ref 0.0–2.0)
Bicarbonate: 26.1 mmol/L (ref 20.0–28.0)
Calcium, Ion: 1.23 mmol/L (ref 1.15–1.40)
HCT: 21 % — ABNORMAL LOW (ref 39.0–52.0)
Hemoglobin: 7.1 g/dL — ABNORMAL LOW (ref 13.0–17.0)
O2 Saturation: 43 %
Patient temperature: 37
Potassium: 4 mmol/L (ref 3.5–5.1)
Sodium: 152 mmol/L — ABNORMAL HIGH (ref 135–145)
TCO2: 28 mmol/L (ref 22–32)
pCO2, Ven: 54.5 mmHg (ref 44–60)
pH, Ven: 7.289 (ref 7.25–7.43)
pO2, Ven: 27 mmHg — CL (ref 32–45)

## 2024-05-28 LAB — FIBRINOGEN: Fibrinogen: 568 mg/dL — ABNORMAL HIGH (ref 210–475)

## 2024-05-28 LAB — PROTIME-INR
INR: 1.3 — ABNORMAL HIGH (ref 0.8–1.2)
Prothrombin Time: 16.6 s — ABNORMAL HIGH (ref 11.4–15.2)

## 2024-05-28 LAB — HEMOGLOBIN AND HEMATOCRIT, BLOOD
HCT: 24.2 % — ABNORMAL LOW (ref 39.0–52.0)
Hemoglobin: 7.6 g/dL — ABNORMAL LOW (ref 13.0–17.0)

## 2024-05-28 LAB — CG4 I-STAT (LACTIC ACID)
Lactic Acid, Venous: 0.5 mmol/L (ref 0.5–1.9)
Lactic Acid, Venous: 0.6 mmol/L (ref 0.5–1.9)

## 2024-05-28 LAB — LACTATE DEHYDROGENASE: LDH: 431 U/L — ABNORMAL HIGH (ref 98–192)

## 2024-05-28 LAB — APTT
aPTT: 35 s (ref 24–36)
aPTT: 81 s — ABNORMAL HIGH (ref 24–36)
aPTT: 71 s — ABNORMAL HIGH (ref 24–36)

## 2024-05-28 LAB — TRIGLYCERIDES: Triglycerides: 249 mg/dL — ABNORMAL HIGH (ref <150)

## 2024-05-28 MED ORDER — ATROPINE SULFATE 1 MG/10ML IJ SOSY
PREFILLED_SYRINGE | INTRAMUSCULAR | Status: AC
  Filled 2024-05-28: qty 20

## 2024-05-28 MED ORDER — HEPARIN SODIUM (PORCINE) 1000 UNIT/ML IJ SOLN
2000.0000 [IU] | Freq: Once | INTRAMUSCULAR | Status: AC
  Administered 2024-05-28: 2000 [IU] via INTRAVENOUS

## 2024-05-28 MED ORDER — KETAMINE HCL-SODIUM CHLORIDE 1000-0.69 MG/100ML-% IV SOLN
0.5000 mg/kg/h | INTRAVENOUS | Status: AC
Start: 2024-05-28 — End: 2024-05-28

## 2024-05-28 MED ORDER — SODIUM BICARBONATE 8.4 % IV SOLN
INTRAVENOUS | Status: AC
  Filled 2024-05-28: qty 200

## 2024-05-28 MED ORDER — FREE WATER
200.0000 mL | Status: DC
  Administered 2024-05-28 – 2024-05-29 (×3): 200 mL

## 2024-05-28 MED ORDER — CALCIUM CHLORIDE 10 % IV SOLN
INTRAVENOUS | Status: AC
  Filled 2024-05-28: qty 20

## 2024-05-28 MED ORDER — BANATROL TF EN LIQD
60.0000 mL | Freq: Two times a day (BID) | ENTERAL | Status: DC
  Administered 2024-05-28 – 2024-06-04 (×14): 60 mL
  Filled 2024-05-28 (×14): qty 60

## 2024-05-28 MED ORDER — SODIUM CHLORIDE 0.9 % IV SOLN
0.1100 mg/kg/h | INTRAVENOUS | Status: DC
  Administered 2024-05-28: 0.14 mg/kg/h via INTRAVENOUS
  Administered 2024-05-29 – 2024-05-30 (×2): 0.09 mg/kg/h via INTRAVENOUS
  Administered 2024-05-31 – 2024-06-04 (×5): 0.11 mg/kg/h via INTRAVENOUS
  Filled 2024-05-28 (×9): qty 250

## 2024-05-28 MED ORDER — NOREPINEPHRINE 4 MG/250ML-% IV SOLN: 0.0000 ug/min | INTRAVENOUS | Status: DC

## 2024-05-28 MED ORDER — EPINEPHRINE 1 MG/10ML IV SOSY
PREFILLED_SYRINGE | INTRAVENOUS | Status: AC
  Filled 2024-05-28: qty 40

## 2024-05-28 MED ORDER — VITAL 1.5 CAL PO LIQD
1000.0000 mL | ORAL | Status: DC
  Administered 2024-05-28 – 2024-06-04 (×11): 1000 mL
  Filled 2024-05-28: qty 1000

## 2024-05-28 MED ORDER — NOREPINEPHRINE 16 MG/250ML-% IV SOLN
0.0000 ug/min | INTRAVENOUS | Status: DC
  Administered 2024-05-29: 6 ug/min via INTRAVENOUS
  Administered 2024-05-30: 8 ug/min via INTRAVENOUS
  Administered 2024-06-01: 15 ug/min via INTRAVENOUS
  Administered 2024-06-01: 8 ug/min via INTRAVENOUS
  Administered 2024-06-04: 6 ug/min via INTRAVENOUS
  Filled 2024-05-28 (×6): qty 250

## 2024-05-28 MED ORDER — STERILE WATER FOR INJECTION IJ SOLN
INTRAMUSCULAR | Status: AC
  Administered 2024-05-28: 1 mL
  Filled 2024-05-28: qty 10

## 2024-05-28 NOTE — Progress Notes (Signed)
 ECMO Circuit Change  During multidisciplinary rounds, the decision was made by Dr. Zenaida and the care team to change out the ECMO circuit. A new circuit was set up and primed using sterile technique and brought to the patient bedside by Lauraine SHEAR. The following staff were present for the procedure:  Johnnie Aid, Marty Garlan Gaither Cherlyn, Amelia Borders, Jerl Fritz, Dr. Zenaida,  Dr. Gretta, Dr. Kerrin, Tinnie Furth PA, Leita Gleason PA, Rexene Blush PA, [place] [facility], Eva Roses RT  The field was prepped and draped, a timeout was performed, and the team completed a briefing prior to the start of the procedure. 2000 units of Heparin  were given per pharmacy at 1707. The ECMO circuit was clamped out at 1708, cut and reconnected to the new circuit via a wet-to-wet connection. Flow was reinitiated at 1709 and 3700 rpms were resumed for a flow of 4.94 LPM. Sweep 9 Fi02 100%   Oxygenator Lot #:  [id number] Tubing Lot #:  [id number] [employer] Console #:  [id number]

## 2024-05-28 NOTE — Progress Notes (Signed)
 PHARMACY - ANTICOAGULATION CONSULT NOTE  Pharmacy Consult for heparin  >> bivalirudin  resumed Indication: VTE, VV ECMO (10/29 - current)  Allergies  Allergen Reactions   Asenapine Other (See Comments) and Nausea And Vomiting    Increased tremors   Dextromethorphan Hbr    Guaifenesin     Latuda [Lurasidone Hcl] Other (See Comments)    Tremors     Lurasidone Other (See Comments) and Hives    Other Reaction(s): Angioedema   Phenylephrine      Patient Measurements: Height: 6' 3 (190.5 cm) Weight: 106 kg (233 lb 11 oz) IBW/kg (Calculated) : 84.5  Vital Signs: Temp: 98.8 F (37.1 C) (11/04 2215) BP: 119/46 (11/04 1931) Pulse Rate: 52 (11/04 2215)  Labs: Recent Labs    05/26/24 0450 05/26/24 0802 05/27/24 0433 05/27/24 1203 05/27/24 1700 05/27/24 1817 05/28/24 0420 05/28/24 0739 05/28/24 1205 05/28/24 1304 05/28/24 1524 05/28/24 1539 05/28/24 1717 05/28/24 1840 05/28/24 1849 05/28/24 2100  HGB 8.9*   < > 8.7*   < > 8.7*   < > 8.3*   < >  --    < > 8.6*   < > 6.5* 6.8* 7.6*  --   HCT 28.8*   < > 28.1*   < > 27.8*   < > 26.6*   < >  --    < > 27.5*   < > 19.0* 20.0* 24.2*  --   PLT 268   < > 209  --  209  --  217  --   --   --  228  --   --   --   --   --   APTT 65*   < > 37*  --  39*  --  35  --  81*  --   --   --   --   --   --  71*  LABPROT 21.4*  --  16.6*  --   --   --  16.6*  --   --   --   --   --   --   --   --   --   INR 1.8*  --  1.3*  --   --   --  1.3*  --   --   --   --   --   --   --   --   --   CREATININE 0.71   < > 0.65  --  0.78  --  0.81  --   --   --  0.88  --   --   --   --   --    < > = values in this interval not displayed.    Estimated Creatinine Clearance: 113.1 mL/min (by C-G formula based on SCr of 0.88 mg/dL).   Medical History: Past Medical History:  Diagnosis Date   Anginal pain    Anxiety    Arthritis    Bipolar 1 disorder (HCC)    Bursitis    CAD (coronary artery disease)    Stent placed 2017   Cancer [facility])    Chronic pain     COPD (chronic obstructive pulmonary disease) (HCC)    Current use of long term anticoagulation    DAPT (ASA + clopidogrel )   Depression    Diverticulitis    Dyspnea    GERD (gastroesophageal reflux disease)    Grade I diastolic dysfunction    Hepatitis C 2012   No longer has Hep C   HLD (hyperlipidemia)  Hypertension    MI (myocardial infarction) (HCC)    Polysubstance abuse (HCC)    cocaine, marijuana, ETOH   PUD (peptic ulcer disease)    S/P angioplasty with stent 06/10/2016   a.) 90% stenosis of pLAD to mLAD - 2.5 x 18 mm Xience Alpine (DES x 1) placed to pLAD   S/P PTCA (percutaneous transluminal coronary angioplasty) 12/04/2019   a.) 60% in stent restenosis of DES to pLAD; LVEF 65%.   Schizophrenia (HCC)    Stroke (HCC)    Tremors    generalized   Valvular insufficiency    a.) Mild MR, TR, PR; mild to moderate AR on 03/05/2018 TTE      Assessment: 63 yoM admitted for RUL lobectomy.  Pt intubated, now having difficulty with ventilation. Placed on VV ECMO 10/29 in the evening. Pharmacy to dose IV bivalirudin .   Previously on therapeutic heparin  (HL 0.33 on 1700/h) for acute DVT of gastrocnemius vein  > changed to bivalirudin  with ECMO cannulation  S/p circuit change today - 2000 units heparin  given ~1700. Bivalirudin  continues at 0.1mg /kg/hr.  aPTT 71 sec (supratherapeutic) for new goal of ~60 sec. No bleeding noted.  Goal of Therapy:  aPTT goal increased to ~ 60 sec (11/4 post circuit change) Monitor platelets by anticoagulation protocol: Yes   Plan:  Reduce bivalirudin  to 0.09 mg/kg/hr Check aPTT with next labs at 0500. Q12h aPTT and CBC  Vito Ralph, PharmD, BCPS Please see amion for complete clinical pharmacist phone list  05/28/2024 10:55 PM

## 2024-05-28 NOTE — Progress Notes (Signed)
 NAME:  Keith Gibbs, MRN:  [id number], DOB:  October 28, 1960, LOS: 19 ADMISSION DATE:  05/09/2024, CONSULTATION DATE: 05/14/2024 REFERRING MD: CTS, CHIEF COMPLAINT: Acute on chronic hypoxic respiratory failure  History of Present Illness:  63 yo male presented for lobectomy with CTS. Progressing as expected post operatively with CT in place and was on 4E when this morning he decompensated with worsening hypoxemia and increased wob. CTS transferred to ICU and requested CCM evaluation.   Patient went into ARDS and eventually cannulated for VV ECMO.  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  He was difficult to oxygenate and ventilate therefore APRV started.  Patient seems to be tolerating the APRV. 10/29 -nitric oxide is weaned off overnight.  It has not helped.  It did not change his respiratory status.  Patient continues to have ARDS with bilateral pulmonary infiltrates.  At this stage we decided to proning.  Chest tube is hooked up to suction. 10/29 -patient's oxygenation improved with proning but ventilation got worse.  At this stage given the ARDS a collective decision is made to cannulate the patient for VV ECMO.  Family was in agreement. 10/30 -blood cultures from 05/19/2024 and BAL cultures from 05/19/2024 grew Candida glabrata 10/31 - 11/2 unable to get the patient off paralytics  11/3 OR for tracheostomy, weaned off paralytics  Interim History / Subjective:  OR yesterday for tracheostomy and started on propofol  weaned off paralytics. TEE at bedside with crescent cannula pulled back.   Objective    Blood  pressure 106/69, pulse 60, temperature 98.6 F (37 C), resp. rate 11, height 6' 3 (1.905 m), weight 106 kg, SpO2 92%. CVP:  [0 mmHg-27 mmHg] 3 mmHg  Vent Mode: PCV FiO2 (%):  [60 %-100 %] 100 % Set Rate:  [15 bmp] 15 bmp PEEP:  [10 cmH20] 10 cmH20 Plateau Pressure:  [17 cmH20-19 cmH20] 19 cmH20   Intake/Output Summary (Last 24 hours) at 05/28/2024 9266 Last data filed at 05/28/2024 0700 Gross per 24 hour  Intake 3990.31 ml  Output 2898 ml  Net 1092.31 ml   Filed Weights   05/26/24 0630 05/27/24 0710 05/28/24 0500  Weight: 110.3 kg 108.5 kg 106 kg    Examination: General: critically ill appearing male, sedated  HEENT: s/p tracheostomy, no evidence of bleeding at stoma, pupils pinpoint and sluggish  Lungs: on mechanical ventilation, absent breath sounds right apex, diminished right base and left side  Cardiovascular: regular rate and rhythm, no murmur, rub or gallop  Abdomen: soft, non-distended, +bowel sounds Extremities: warm, dry, trace dependent edema  Neuro: sedated on propofol , ketamine  and versed , pupils pinpoint and sluggish   VV ECMO Flow: 4.56 L/min at 3550 rpm Delta P: 31 Sweep: 8 L/min  Does have some clot and fibrin stranding in oxygenator, stable from yesterday   Resolved problem list   Assessment and Plan   Acute hypoxic and hypercapnic respiratory failure due to ARDS in the setting of candida glabrata and pseudomonas pneumonia s/p VV ECMO s/p tracheostomy 11/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 History of COPD On the night of 10/21 POD4 patient developed respiratory distress and ultimately required intubation 10/26. On 10/29  patient was cannulated for VV ECMO via R internal jugular crescent cannula. TEE at bedside 11/3 with crescent cannula pulled back. Tracheostomy placed 11/3.  - Continue to monitor daily CXR, intermittent ABG  - Continue rest settings on ventilator  - Adjust sweep as needed to maintain PaCO2 35-45  - Goal SpO2>80% and PaO2>45,  PaO2<45 check lactate  - R chest tube is to waterseal without air leak, continue to monitor output  - Continue nitric oxide via circuit. Currently with good blood color change but can consider pre- and post-oxygenator gases  - PRN Duonebs  - Resume bivalrudin this morning with goal PTT 50   Angalosedation History of anxiety, schizoaffective disorder bipolar type  - Nimbex stopped 11/3 - Continue propofol , versed  and HM infusions to maintain RASS -4 to -5  - Plan to wean ketamine    - Continue PO klonopin and oxycodone   - Continue IV olanzapine  in place of home Seroquel , may need to increase olanzapine . Will check QTc - Holding home celecoxib , Cymbalta , and depakote    Leukocytosis, improving  Septic shock, improved  Candida glabrata pneumonia  Fungemic with candida glabrata  Pseudomonas pneumonia  BAL 10/26 with rare pseudomonas and candida glabrata. Blood cultures 10/26 with candida glabrata. Blood cultures 10/30 and 10/31 no growth to date. Remains off vasopressors. - Continue to follow-up blood cultures  - ID following, appreciate recommendations  - Continue meropenem, plan for 14 day course  - Continue micafungin   AKI, resolved  Hypernatremia - Monitor renal functions and urine output - Monitor electrolytes and replace accordingly - Continue FWF, may need to increase. Will repeat BMP in afternoon   Elevated transaminases Possibly due to meds or critical illness and hypoxia.  - Continue to trend CMP   RLE DVT  BLE duplex 10/27 with acute intramuscular thrombosis involving the right gastrocnemius veins.  - Continue bivalirudin  per ECMO protocol, goal PTT 50  Anemia in the setting of critical illness and acute blood loss Hgb stable at 7.8. No active signs of bleeding. LDH stable 431.  - Continue to monitor daily CBC   Malnutrition  - Continue advancing tube feeds to goal   GI prophylaxis: continue PPI  DVT prophylaxis: resume bival infusion   Labs   CBC: Recent  Labs  Lab 05/24/24 0401 05/24/24 0408 05/25/24 0411 05/25/24 0417 05/26/24 0450 05/26/24 0802 05/26/24 1556 05/26/24 1631 05/27/24 0433 05/27/24 1203 05/27/24 1700 05/27/24 1817 05/27/24 2128 05/28/24 0418 05/28/24 0420  WBC 21.2*   < > 21.0*   < > 24.2*  --  20.6*  --  21.5*  --  21.1*  --   --   --  21.2*  NEUTROABS 15.5*  --  15.9*  --  18.7*  --   --   --  16.0*  --   --   --   --   --  16.5*  HGB 9.1*   < > 9.0*   < > 8.9*   < > 8.9*   < > 8.7*   < > 8.7* 8.2* 8.2* 7.8* 8.3*  HCT 29.2*   < > 29.6*   < > 28.8*   < > 28.6*   < > 28.1*   < > 27.8* 24.0* 24.0* 23.0* 26.6*  MCV 104.3*   < > 105.0*   < > 104.0*  --  105.5*  --  105.2*  --  104.9*  --   --   --  104.3*  PLT 248   < > 285   < >  268  --  250  --  209  --  209  --   --   --  217   < > = values in this interval not displayed.    Basic Metabolic Panel: Recent Labs  Lab 05/22/24 0451 05/22/24 0746 05/23/24 0445 05/23/24 0453 05/24/24 0401 05/24/24 0408 05/25/24 0411 05/25/24 0417 05/26/24 0450 05/26/24 0802 05/26/24 1556 05/26/24 1631 05/27/24 0433 05/27/24 1203 05/27/24 1700 05/27/24 1817 05/27/24 2128 05/28/24 0418 05/28/24 0420  NA 143   < > 146*   < > 147*   < > 148*   < > 148*   < > 151*   < > 150*   < > 150* 150* 151* 151* 149*  K 4.8   < > 5.3*   < > 5.1   < > 4.9   < > 4.7   < > 4.8   < > 4.7   < > 4.7 4.9 4.4 4.4 4.4  CL 100   < > 106   < > 107   < > 111   < > 112*  --  114*  --  113*  --  113*  --   --   --  113*  CO2 29   < > 29   < > 29   < > 29   < > 28  --  29  --  29  --  26  --   --   --  26  GLUCOSE 134*   < > 113*   < > 109*   < > 124*   < > 115*  --  129*  --  108*  --  84  --   --   --  134*  BUN 51*   < > 46*   < > 50*   < > 48*   < > 42*  --  39*  --  38*  --  38*  --   --   --  47*  CREATININE 0.98   < > 0.88   < > 0.84   < > 0.85   < > 0.71  --  0.70  --  0.65  --  0.78  --   --   --  0.81  CALCIUM  7.9*   < > 7.5*   < > 8.2*   < > 8.2*   < > 8.1*  --  8.2*  --  8.3*  --  8.3*  --    --   --  8.0*  MG 3.1*  --  3.1*  --  3.0*  --  2.5*  --  2.3  --   --   --   --   --   --   --   --   --   --   PHOS 3.2  --  3.1  --  3.7  --   --   --   --   --   --   --   --   --   --   --   --   --   --    < > = values in this interval not displayed.   GFR: Estimated Creatinine Clearance: 122.9 mL/min (by C-G formula based on SCr of 0.81 mg/dL). Recent Labs  Lab 05/26/24 1210 05/26/24 1556 05/26/24 1627 05/27/24 0027 05/27/24 0433 05/27/24 1700 05/27/24 2133 05/28/24 0420  WBC  --  20.6*  --   --  21.5*  21.1*  --  21.2*  LATICACIDVEN 0.5  --  0.6 0.6  --   --  0.7  --     Liver Function Tests: Recent Labs  Lab 05/24/24 0401 05/25/24 0411 05/26/24 0450 05/27/24 0433 05/28/24 0420  AST 56* 57* 117* 89* 78*  ALT 50* 59* 84* 102* 85*  ALKPHOS 92 92 111 146* 180*  BILITOT 0.7 0.5 0.8 1.3* 2.8*  PROT 5.3* 5.2* 5.1* 5.1* 5.0*  ALBUMIN  1.9* 2.1* 1.9* 1.8* 1.8*    No results for input(s): LIPASE, AMYLASE in the last 168 hours. No results for input(s): AMMONIA in the last 168 hours.  ABG    Component Value Date/Time   PHART 7.394 05/28/2024 0418   PCO2ART 44.8 05/28/2024 0418   PO2ART 54 (L) 05/28/2024 0418   HCO3 27.4 05/28/2024 0418   TCO2 29 05/28/2024 0418   ACIDBASEDEF 1.0 05/19/2024 1154   O2SAT 87 05/28/2024 0418     Coagulation Profile: Recent Labs  Lab 05/24/24 0401 05/25/24 0411 05/26/24 0450 05/27/24 0433 05/28/24 0420  INR 1.3* 1.7* 1.8* 1.3* 1.3*    Cardiac Enzymes: No results for input(s): CKTOTAL, CKMB, CKMBINDEX, TROPONINI in the last 168 hours.  HbA1C: Hgb A1c MFr Bld  Date/Time Value Ref Range Status  02/12/2024 08:40 AM 5.3 4.8 - 5.6 % Final    Comment:             Prediabetes: 5.7 - 6.4          Diabetes: >6.4          Glycemic control for adults with diabetes: <7.0   04/26/2022 03:30 PM 5.2 4.8 - 5.6 % Final    Comment:    (NOTE) Pre diabetes:          5.7%-6.4%  Diabetes:              >6.4%  Glycemic  control for   <7.0% adults with diabetes     CBG: Recent Labs  Lab 05/27/24 1201 05/27/24 1629 05/27/24 2005 05/27/24 2333 05/28/24 0416  GLUCAP 107* 99 105* 128* 135*       The patient is critically ill with multiple organ system failure and requires high complexity decision making for assessment and support, frequent evaluation and titration of therapies, advanced monitoring, review of radiographic studies and interpretation of complex data.   Critical Care Time devoted to patient care services, exclusive of separately billable procedures, described in this note is 45 minutes.  Rexene LOISE Blush, PA-C [facility] 05/28/24 7:33 AM  Please see [email or web address] for pager details.  From 7A-7P if no response, please call [phone] After hours, please call ELink [phone]

## 2024-05-28 NOTE — Progress Notes (Signed)
 EVENING ROUNDS NOTE :     [address]       , [address]             [phone]                 1 Day Post-Op Procedure(s) (LRB): CREATION, TRACHEOSTOMY (N/A)   Total Length of Stay:  LOS: 19 days  Events:   No events     BP (!) 114/47   Pulse 61   Temp (!) 97.5 F (36.4 C)   Resp (!) 0   Ht 6' 3 (1.905 m)   Wt 106 kg   SpO2 (!) 88%   BMI 29.21 kg/m   CVP:  [1 mmHg-27 mmHg] 9 mmHg  Vent Mode: PCV FiO2 (%):  [60 %-100 %] 80 % Set Rate:  [15 bmp] 15 bmp PEEP:  [10 cmH20] 10 cmH20 Plateau Pressure:  [19 cmH20] 19 cmH20   albumin  human Stopped (05/28/24 1259)   bivalirudin  (ANGIOMAX ) 250 mg in sodium chloride  0.9 % 500 mL (0.5 mg/mL) infusion 0.1 mg/kg/hr (05/28/24 1900)   feeding supplement (VITAL 1.5 CAL) 35 mL/hr at 05/28/24 1900   HYDROmorphone  4.5 mg/hr (05/28/24 1900)   meropenem (MERREM) IV Stopped (05/28/24 1342)   micafungin (MYCAMINE) 200 mg in sodium chloride  0.9 % 100 mL IVPB Stopped (05/28/24 1846)   midazolam  19 mg/hr (05/28/24 1900)   niCARDipine Stopped (05/24/24 2221)   norepinephrine (LEVOPHED) Adult infusion 7 mcg/min (05/28/24 1900)   propofol  (DIPRIVAN ) infusion 50 mcg/kg/min (05/28/24 1900)    I/O last 3 completed shifts: In: 6193.2 [I.V.:2059.1; NG/GT:2508.9; IV Piggyback:1625.2] Out: 4148 [Urine:3638; Stool:100; Chest Tube:410]      Latest Ref Rng & Units 05/28/2024    6:49 PM 05/28/2024    6:40 PM 05/28/2024    5:17 PM  CBC  Hemoglobin 13.0 - 17.0 g/dL 7.6  6.8  6.5   Hematocrit 39.0 - 52.0 % 24.2  20.0  19.0        Latest Ref Rng & Units 05/28/2024    6:40 PM 05/28/2024    5:17 PM 05/28/2024    5:16 PM  BMP  Sodium 135 - 145 mmol/L 153  151  152   Potassium 3.5 - 5.1 mmol/L 4.1  4.3  4.2     ABG    Component Value Date/Time   PHART 7.435 05/28/2024 1840   PCO2ART 35.7 05/28/2024 1840   PO2ART 48 (L) 05/28/2024 1840   HCO3 24.1 05/28/2024 1840   TCO2 25 05/28/2024 1840   ACIDBASEDEF 2.0  05/28/2024 1541   O2SAT 86 05/28/2024 1840       Danyel Griess, MD 05/28/2024 7:17 PM

## 2024-05-28 NOTE — Progress Notes (Addendum)
 Nutrition Follow-up  DOCUMENTATION CODES:   Non-severe (moderate) malnutrition in context of chronic illness  INTERVENTION:   Tube Feeding via Cortrak:  Increase Goal: Vital 1.5 at 65 ml/hr Pro-Source TF20 60 mL QID Goal TF regimen provides 185 g of protein, 2582 kcals, 1100 mL of free water   Hypernatremia persists, current free water  flushes of 200 mL q 4  hours provides 1200 mL of free water  in 24 hours. May need to consider addition of short term IV hydration to correct free water  deficit   Recommend addition of scheduled free water  flushes. Sodium elevated, BUN elevated with normal Creatinine, good diuresis on lasix, CVP 3-4 this AM; concern for free water  deficit.  Recommend starting with 150 mL q 6 hours (additional 600 mL in 24 hours), may need additional free water  on follow-up   Add Banatrol TF 60 mL BID via tube as stool bulking agent- each packet provides 5g soluble fiber  Recommend checking phosphorus and magnesium  in AM. Phos last checked 10/31.   NUTRITION DIAGNOSIS:   Moderate Malnutrition related to chronic illness as evidenced by moderate muscle depletion, moderate fat depletion.  Continues but being addressed via TF   GOAL:   Patient will meet greater than or equal to 90% of their needs  Progressing  MONITOR:   Vent status, Labs, Weight trends, TF tolerance  REASON FOR ASSESSMENT:   Consult, Ventilator Enteral/tube feeding initiation and management  ASSESSMENT:   63 yo male admitted for surgery with known RUL lung nodule with plan for lobectomy on 10/16. Pt with respiratory decline requiring transfer to ICU 10/21 and intubated on 10/26.  PMH includes COPD, Stroke, MI, GERD, HTN, Hep C, PUD, diverticulitis, HF, hx of polysubstance abuse (Cocaine, marijuana, EtOH), anxiety/depression, Bipolar 1 disorder, schizophrenia  10/16 OR: Robotic assisted R thoracoscopy, RUL wedge resection, R upper lobectomy, lymph node dissection 10/26 Worsening respiratory  status requiring intubation, iNO started 10/27 trickle TF started 10/28 TF titrating up 10/29 Cortrak placed-distal stomach; cannulated for VV ECMO, TF at goal 10/30 TF held this morning; re-initiated in afternoon - no s/sx aspiration, trickle TF 10/31 TF remains at trickle til BM, finally had BM after relistor/sorbitol, +nimbex gtt 11/02 TF at goal of 60 11/03 Trach by ENT, weaned off paralytic, propofol  initiated. TEE yesterday with no vegetations. Cortrak at antro-pyloric region of stomach per abd xray. TF held for OR, restarted at trickle  Pt remains sedated on vent support, remains on iNO. VV ECMO since 10/29. Sweep at 8 Ketamine  discontinued today, +Dilaudid , Versed , Propofol  gtt Levophed briefly today  TF resumed at 20 ml/hr yesterday post trach; planning to titrate back to goal rate today  Free water  increased to 200 mL q 4 hours. Sodium remains elevated, currently 152. BUN 51  (likely related to dehydration/free water  deficit). Creatinine normal  UOP 2.6 L in 24 hours; 600 mL so far today  Weight down to 106 kg; lowest wt since admission. Net negative 10-11 L since admission per I/O flow sheet. Admission wt around 116 kg  Noted newly documented DTIs  CBGs acceptable range  +stool, FMS with 200 mL in 24 hours  Chest tube (pleural): 220 mL in 24 hours, 160 mL today  Labs: CBGs 110-119 Potassium 4.3 (wdl) BUN 51 (H) Creatinine wdl Last Phos 10/31 No mag today TG 249  Meds: Miralax  Protonix  Senna-Docusate IV micafungin SS novolog  Diet Order:   Diet Order     None       EDUCATION NEEDS:   Not appropriate  for education at this time  Skin:  Skin Assessment: Skin Integrity Issues: Skin Integrity Issues:: DTI DTI: Heel Incisions: chest (surgical)  Last BM:  11/4 via FMS  Height:   Ht Readings from Last 1 Encounters:  05/19/24 6' 3 (1.905 m)    Weight:   Wt Readings from Last 1 Encounters:  05/28/24 106 kg    Ideal Body Weight:  89.1  kg  BMI:  Body mass index is 29.21 kg/m.  Estimated Nutritional Needs:   Kcal:  2400-2600 kcals  Protein:  175-190g  Fluid:  >/= 2L   Betsey Finger MS, RDN, LDN, CNSC Registered Dietitian 3 Clinical Nutrition RD Inpatient Contact Info in Amion

## 2024-05-28 NOTE — Progress Notes (Signed)
 PHARMACY - ANTICOAGULATION CONSULT NOTE  Pharmacy Consult for heparin  >> bivalirudin  resumed Indication: VTE, VV ECMO (10/29 - current)  Allergies  Allergen Reactions   Asenapine Other (See Comments) and Nausea And Vomiting    Increased tremors   Dextromethorphan Hbr    Guaifenesin     Latuda [Lurasidone Hcl] Other (See Comments)    Tremors     Lurasidone Other (See Comments) and Hives    Other Reaction(s): Angioedema   Phenylephrine      Patient Measurements: Height: 6' 3 (190.5 cm) Weight: 106 kg (233 lb 11 oz) IBW/kg (Calculated) : 84.5  Vital Signs: Temp: 98.6 F (37 C) (11/04 1445) BP: 114/47 (11/04 1048) Pulse Rate: 70 (11/04 1445)  Labs: Recent Labs    05/26/24 0450 05/26/24 0802 05/27/24 0433 05/27/24 1203 05/27/24 1700 05/27/24 1817 05/28/24 0420 05/28/24 0739 05/28/24 1205 05/28/24 1304 05/28/24 1524 05/28/24 1539 05/28/24 1541  HGB 8.9*   < > 8.7*   < > 8.7*   < > 8.3*   < >  --    < > 8.6* 7.1* 7.8*  HCT 28.8*   < > 28.1*   < > 27.8*   < > 26.6*   < >  --    < > 27.5* 21.0* 23.0*  PLT 268   < > 209  --  209  --  217  --   --   --  228  --   --   APTT 65*   < > 37*  --  39*  --  35  --  81*  --   --   --   --   LABPROT 21.4*  --  16.6*  --   --   --  16.6*  --   --   --   --   --   --   INR 1.8*  --  1.3*  --   --   --  1.3*  --   --   --   --   --   --   CREATININE 0.71   < > 0.65  --  0.78  --  0.81  --   --   --  0.88  --   --    < > = values in this interval not displayed.    Estimated Creatinine Clearance: 113.1 mL/min (by C-G formula based on SCr of 0.88 mg/dL).   Medical History: Past Medical History:  Diagnosis Date   Anginal pain    Anxiety    Arthritis    Bipolar 1 disorder (HCC)    Bursitis    CAD (coronary artery disease)    Stent placed 2017   Cancer (HCC)    Chronic pain    COPD (chronic obstructive pulmonary disease) (HCC)    Current use of long term anticoagulation    DAPT (ASA + clopidogrel )   Depression     Diverticulitis    Dyspnea    GERD (gastroesophageal reflux disease)    Grade I diastolic dysfunction    Hepatitis C 2012   No longer has Hep C   HLD (hyperlipidemia)    Hypertension    MI (myocardial infarction) (HCC)    Polysubstance abuse (HCC)    cocaine, marijuana, ETOH   PUD (peptic ulcer disease)    S/P angioplasty with stent 06/10/2016   a.) 90% stenosis of pLAD to mLAD - 2.5 x 18 mm Xience Alpine (DES x 1) placed to pLAD   S/P PTCA (percutaneous transluminal  coronary angioplasty) 12/04/2019   a.) 60% in stent restenosis of DES to pLAD; LVEF 65%.   Schizophrenia (HCC)    Stroke (HCC)    Tremors    generalized   Valvular insufficiency    a.) Mild MR, TR, PR; mild to moderate AR on 03/05/2018 TTE      Assessment: 63 yoM admitted for RUL lobectomy.  Pt intubated, now having difficulty with ventilation. Placed on VV ECMO 10/29 in the evening. Pharmacy to dose IV bivalirudin .   Previously on therapeutic heparin  (HL 0.33 on 1700/h) for acute DVT of gastrocnemius vein  > changed to bivalirudin  with ECMO cannulation  Bivalirudin  resumed today after trach yesterday.  APTT off anticoagulation down to 35.  Bivalirudin  was resumed at 0.14 earlier today with resulting aPTT = 81.  No overt bleeding or complications noted.  Goal of Therapy:  aPTT goal ~ 50 for now Monitor platelets by anticoagulation protocol: Yes   Plan:  Reduce bivalirudin  to 0.1 mg/kg/hr Check aPTT with next labsat 5 pm. Q12 hr aPTT, CBC.  Harlene Barlow, [employer], BCCP Clinical Pharmacist  05/28/2024 4:01 PM   [facility] pharmacy phone numbers are listed on [email or web address]

## 2024-05-28 NOTE — CV Procedure (Signed)
 ECMO NOTE:   Indication: Respiratory failure due to ARDS   Initial cannulation date: 05/22/24   ECMO type: VV ECMO   Dual lumen inflow/return cannula:   1) 46F [place] placed in RIJ    Daily data:   Flow 4.58L RPM 3575 Sweep  8L Pven -89   Labs:   ABG    Component Value Date/Time   PHART 7.338 (L) 05/28/2024 1304   PCO2ART 45.4 05/28/2024 1304   PO2ART 53 (L) 05/28/2024 1304   HCO3 24.4 05/28/2024 1304   TCO2 26 05/28/2024 1304   ACIDBASEDEF 1.0 05/28/2024 1304   O2SAT 85 05/28/2024 1304    Hgb 8.3 Platelets 217 LDH 431 PTT 35, restart bivalrudin this morning with goal close to 50  Plan:   Continue VV ECMO support Await lung recovery, expect prolonged course Restart bival, no significant trach bleeding Continue abx/antifungals, 14 day course of meropenem than off Off paralytics, wean ketamine  and then versed  today Nitric oxide through the circuit Worsening fibrin buildup at 12, continues to build. Delta P ok   Keith JINNY Brownie, MD  2:29 PM

## 2024-05-28 NOTE — Progress Notes (Signed)
 PCCM Interval Progress Note  ABG at 1518: 7.29/54/44. Increased sweep to 9. Only maintaining SpO2 ~78%. Pre-oxygenator gas 7.28/54/27 and post 7.37/40/151. Given fibrin burden on oxygenator decision made to move forward with pump exchange. ECMO team, heart failure and PCCM at bedside and performed without complication. Repeat pre-oxygenator gas similar and post improved significantly to 7.47/32/540. Will increase bival to PTT goal 60. FiO2 on vent weaned to 80%. Plan to repeat ABG in 1 hour and continue to wean ventilator FiO2 and sweep as tolerated.    Rexene LOISE Blush, PA-C [facility] 05/28/24 6:01 PM  Please see [email or web address] for pager details.  From 7A-7P if no response, please call [phone] After hours, please call ELink [phone]

## 2024-05-28 NOTE — TOC Progression Note (Signed)
 Transition of Care [facility]) - Progression Note    Patient Details  Name: Keith Gibbs MRN: [id number] Date of Birth: 09/08/1960  Transition of Care [facility]) CM/SW Contact  Justina Delcia Czar, RN Phone Number: [phone] 05/28/2024, 4:15 PM  Clinical Narrative:    Patient lives at home with mother. Pt intubated, and ECMO.  Chart reviewed for discharge readiness, patient not medically stable for d/c. Inpatient CM/CSW will continue to monitor pt's advancement through interdisciplinary progression rounds.   If new pt transition needs arise, MD please place a TOC consult.     Expected Discharge Plan: Home w Home Health Services Barriers to Discharge: Continued Medical Work up               Expected Discharge Plan and Services   Discharge Planning Services: CM Consult   Living arrangements for the past 2 months: Single Family Home                                       Social Drivers of Health (SDOH) Interventions SDOH Screenings   Food Insecurity: No Food Insecurity (05/09/2024)  Recent Concern: Food Insecurity - Food Insecurity Present (04/16/2024)  Housing: Unknown (05/10/2024)  Recent Concern: Housing - High Risk (04/16/2024)  Transportation Needs: No Transportation Needs (05/09/2024)  Utilities: Not At Risk (05/09/2024)  Alcohol  Screen: Low Risk  (01/29/2023)  Depression (PHQ2-9): Medium Risk (04/05/2024)  Financial Resource Strain: High Risk (04/16/2024)  Physical Activity: Inactive (04/16/2024)  Social Connections: Moderately Integrated (05/10/2024)  Stress: Stress Concern Present (04/16/2024)  Tobacco Use: Medium Risk (05/09/2024)  Health Literacy: Adequate Health Literacy (12/19/2023)    Readmission Risk Interventions    03/01/2022   11:27 AM  Readmission Risk Prevention Plan  Transportation Screening Complete  HRI or Home Care Consult Complete  Social Work Consult for Recovery Care Planning/Counseling Complete  Palliative Care Screening Not Applicable   Medication Review Oceanographer) Complete

## 2024-05-28 NOTE — Progress Notes (Signed)
 Advanced Heart Failure Rounding Note   Subjective:     Placed on VV ECMO day 05/22/2024  Continues on versed  at 20, dilaudid  at 4, ketamine  at 1. Nimbex off.  Wean ketamine  then versed  today. Keep propfol for the moment.  Oxygen saturations remain extremely low despite TEE with repositioning. Given normal lactic, heart rate, end organ function no indication for escalation.   CVP slowly rising, but exam otherwise ok. May need diuresis soon.    Objective:   Weight Range:  Vital Signs:   Temp:  [98.1 F (36.7 C)-99 F (37.2 C)] 98.4 F (36.9 C) (11/04 1330) Pulse Rate:  [54-71] 70 (11/04 1330) Resp:  [0-74] 0 (11/04 1330) BP: (114-128)/(47-48) 114/47 (11/04 1048) SpO2:  [78 %-95 %] 78 % (11/04 1330) Arterial Line BP: (99-139)/(39-59) 128/51 (11/04 1330) FiO2 (%):  [60 %-100 %] 80 % (11/04 1048) Weight:  [106 kg] 106 kg (11/04 0500) Last BM Date : 05/28/24  Weight change: Filed Weights   05/26/24 0630 05/27/24 0710 05/28/24 0500  Weight: 110.3 kg 108.5 kg 106 kg    Intake/Output:   Intake/Output Summary (Last 24 hours) at 05/28/2024 1432 Last data filed at 05/28/2024 1300 Gross per 24 hour  Intake 3830.77 ml  Output 2473 ml  Net 1357.77 ml     Physical Exam: General:  intubated/sedated HEENT: + ETT Neck: supple. RIJ ECMO cannula, dressing C/D/I Cor: RRR Lungs: diminished, extremely limited chest wall movement Abdomen: soft, nontender, nondistended Extremities: no cyanosis, clubbing, rash, edema Neuro:sedated    Labs: Basic Metabolic Panel: Recent Labs  Lab 05/22/24 0451 05/22/24 0746 05/23/24 0445 05/23/24 0453 05/24/24 0401 05/24/24 0408 05/25/24 0411 05/25/24 0417 05/26/24 0450 05/26/24 0802 05/26/24 1556 05/26/24 1631 05/27/24 0433 05/27/24 1203 05/27/24 1700 05/27/24 1817 05/28/24 0418 05/28/24 0420 05/28/24 0739 05/28/24 0848 05/28/24 1304  NA 143   < > 146*   < > 147*   < > 148*   < > 148*   < > 151*   < > 150*   < > 150*    < > 151* 149* 151* 153* 153*  K 4.8   < > 5.3*   < > 5.1   < > 4.9   < > 4.7   < > 4.8   < > 4.7   < > 4.7   < > 4.4 4.4 4.2 3.8 3.8  CL 100   < > 106   < > 107   < > 111   < > 112*  --  114*  --  113*  --  113*  --   --  113*  --   --   --   CO2 29   < > 29   < > 29   < > 29   < > 28  --  29  --  29  --  26  --   --  26  --   --   --   GLUCOSE 134*   < > 113*   < > 109*   < > 124*   < > 115*  --  129*  --  108*  --  84  --   --  134*  --   --   --   BUN 51*   < > 46*   < > 50*   < > 48*   < > 42*  --  39*  --  38*  --  38*  --   --  47*  --   --   --   CREATININE 0.98   < > 0.88   < > 0.84   < > 0.85   < > 0.71  --  0.70  --  0.65  --  0.78  --   --  0.81  --   --   --   CALCIUM  7.9*   < > 7.5*   < > 8.2*   < > 8.2*   < > 8.1*  --  8.2*  --  8.3*  --  8.3*  --   --  8.0*  --   --   --   MG 3.1*  --  3.1*  --  3.0*  --  2.5*  --  2.3  --   --   --   --   --   --   --   --   --   --   --   --   PHOS 3.2  --  3.1  --  3.7  --   --   --   --   --   --   --   --   --   --   --   --   --   --   --   --    < > = values in this interval not displayed.    Liver Function Tests: Recent Labs  Lab 05/24/24 0401 05/25/24 0411 05/26/24 0450 05/27/24 0433 05/28/24 0420  AST 56* 57* 117* 89* 78*  ALT 50* 59* 84* 102* 85*  ALKPHOS 92 92 111 146* 180*  BILITOT 0.7 0.5 0.8 1.3* 2.8*  PROT 5.3* 5.2* 5.1* 5.1* 5.0*  ALBUMIN  1.9* 2.1* 1.9* 1.8* 1.8*   No results for input(s): LIPASE, AMYLASE in the last 168 hours. No results for input(s): AMMONIA in the last 168 hours.  CBC: Recent Labs  Lab 05/24/24 0401 05/24/24 0408 05/25/24 0411 05/25/24 0417 05/26/24 0450 05/26/24 0802 05/26/24 1556 05/26/24 1631 05/27/24 0433 05/27/24 1203 05/27/24 1700 05/27/24 1817 05/28/24 0418 05/28/24 0420 05/28/24 0739 05/28/24 0848 05/28/24 1304  WBC 21.2*   < > 21.0*   < > 24.2*  --  20.6*  --  21.5*  --  21.1*  --   --  21.2*  --   --   --   NEUTROABS 15.5*  --  15.9*  --  18.7*  --   --   --  16.0*   --   --   --   --  16.5*  --   --   --   HGB 9.1*   < > 9.0*   < > 8.9*   < > 8.9*   < > 8.7*   < > 8.7*   < > 7.8* 8.3* 7.8* 7.5* 7.8*  HCT 29.2*   < > 29.6*   < > 28.8*   < > 28.6*   < > 28.1*   < > 27.8*   < > 23.0* 26.6* 23.0* 22.0* 23.0*  MCV 104.3*   < > 105.0*   < > 104.0*  --  105.5*  --  105.2*  --  104.9*  --   --  104.3*  --   --   --   PLT 248   < > 285   < > 268  --  250  --  209  --  209  --   --  217  --   --   --    < > =  values in this interval not displayed.    Cardiac Enzymes: No results for input(s): CKTOTAL, CKMB, CKMBINDEX, TROPONINI in the last 168 hours.  BNP: BNP (last 3 results) Recent Labs    05/30/23 1059 03/20/24 1419  BNP 34.3 20.6    ProBNP (last 3 results) No results for input(s): PROBNP in the last 8760 hours.      Medications:     Scheduled Medications:  arformoterol  15 mcg Nebulization BID   artificial tears  1 Application Both Eyes Q8H   Chlorhexidine  Gluconate Cloth  6 each Topical Daily   clonazePAM  1 mg Oral Q8H   feeding supplement (PROSource TF20)  60 mL Per Tube QID   free water   200 mL Per Tube Q6H   gabapentin   600 mg Oral Daily   And   gabapentin   1,200 mg Oral QHS   insulin aspart  0-15 Units Subcutaneous Q4H   mupirocin  ointment  1 Application Nasal BID   OLANZapine   5 mg Intravenous Daily   mouth rinse  15 mL Mouth Rinse Q2H   oxyCODONE   10 mg Oral QID   pantoprazole  (PROTONIX ) IV  40 mg Intravenous QHS   polyethylene glycol  17 g Oral Daily   revefenacin  175 mcg Nebulization Daily   senna-docusate  2 tablet Oral QHS   sodium chloride  flush  10-40 mL Intracatheter Q12H   sodium chloride  flush  10-40 mL Intracatheter Q12H    Infusions:  albumin  human Stopped (05/28/24 1259)   bivalirudin  (ANGIOMAX ) 250 mg in sodium chloride  0.9 % 500 mL (0.5 mg/mL) infusion 0.1 mg/kg/hr (05/28/24 1421)   feeding supplement (VITAL 1.5 CAL) 30 mL/hr at 05/28/24 1300   HYDROmorphone  4.5 mg/hr (05/28/24 1300)    meropenem (MERREM) IV 1 g (05/28/24 1312)   micafungin (MYCAMINE) 200 mg in sodium chloride  0.9 % 100 mL IVPB Stopped (05/27/24 1919)   midazolam  20 mg/hr (05/28/24 1300)   niCARDipine Stopped (05/24/24 2221)   norepinephrine (LEVOPHED) Adult infusion Stopped (05/28/24 1208)   propofol  (DIPRIVAN ) infusion 40 mcg/kg/min (05/28/24 1300)    PRN Medications: albumin  human, hydrALAZINE , HYDROmorphone , hydrOXYzine , ipratropium-albuterol , lactulose, midazolam , ondansetron  (ZOFRAN ) IV, mouth rinse, mouth rinse, sodium chloride  flush, sodium chloride  flush   Assessment/Plan:   1. 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 Restart anticoagulation with bivalrudin today, PTT goal around 50 - Nitric oxide through the circuit for anticoagulation and long pump run - Sedation adjusted, doing better off paralytics. No significant change in oxygenation - TEE with cannula adjustment 11/3 - TV remain extremely low - CCM managing vent and R chest tube - Trach 11/3, appreciate ENT - continue abx. No need for diuretics, may need tomorrow - Transfuse hgb < 7.5  2. RUL lung CA s/p lobectomy 3. COPD with ongoing tobacco use 4. Bipolar d/o 5. CAD 6. Candida glabrata fungemia/PNA - ID following - on micafungin for fungemia, ID following, course to be determined given indwelling lines - steroids off - Continue meropenem for 14 day course  7. HTN - ensure adequate sedation under paralytics - add cardene as needed  Remains critically ill with ARDS/fungal PNA/PTX on top of severe underlying lung disease. Long run expected CRITICAL CARE Performed by: Morene JINNY Brownie   Total critical care time: 45 minutes  Critical care time was exclusive of separately billable procedures and treating other patients.  Critical care was necessary to treat or prevent  imminent or life-threatening  deterioration.  Critical care was time spent personally by me on the following activities: development of treatment plan with patient and/or surrogate as well as nursing, discussions with consultants, evaluation of patient's response to treatment, examination of patient, obtaining history from patient or surrogate, ordering and performing treatments and interventions, ordering and review of laboratory studies, ordering and review of radiographic studies, pulse oximetry and re-evaluation of patient's condition.

## 2024-05-28 NOTE — Progress Notes (Signed)
 1 Day Post-Op Procedure(s) (LRB): CREATION, TRACHEOSTOMY (N/A) Subjective: sedated  Objective: Vital signs in last 24 hours: Temp:  [98.1 F (36.7 C)-99 F (37.2 C)] 98.6 F (37 C) (11/04 0700) Pulse Rate:  [54-72] 60 (11/04 0700) Cardiac Rhythm: Normal sinus rhythm (11/03 1948) Resp:  [0-75] 11 (11/04 0700) BP: (103-106)/(62-69) 106/69 (11/03 1200) SpO2:  [78 %-98 %] 92 % (11/04 0700) Arterial Line BP: (102-143)/(39-59) 126/47 (11/04 0700) FiO2 (%):  [60 %-100 %] 100 % (11/04 0321) Weight:  [106 kg] 106 kg (11/04 0500)  Hemodynamic parameters for last 24 hours: CVP:  [0 mmHg-27 mmHg] 3 mmHg  Intake/Output from previous day: 11/03 0701 - 11/04 0700 In: 3990.3 [I.V.:1108.9; NG/GT:1702.7; IV Piggyback:1178.8] Out: 2898 [Urine:2578; Stool:100; Chest Tube:220] Intake/Output this shift: No intake/output data recorded.  General appearance: ill-appearing Neurologic: sedated Heart: regular rate and rhythm Lungs: diminished breath sounds bilaterally Abdomen: soft, + BS  Lab Results: Recent Labs    05/27/24 1700 05/27/24 1817 05/28/24 0418 05/28/24 0420  WBC 21.1*  --   --  21.2*  HGB 8.7*   < > 7.8* 8.3*  HCT 27.8*   < > 23.0* 26.6*  PLT 209  --   --  217   < > = values in this interval not displayed.   BMET:  Recent Labs    05/27/24 1700 05/27/24 1817 05/28/24 0418 05/28/24 0420  NA 150*   < > 151* 149*  K 4.7   < > 4.4 4.4  CL 113*  --   --  113*  CO2 26  --   --  26  GLUCOSE 84  --   --  134*  BUN 38*  --   --  47*  CREATININE 0.78  --   --  0.81  CALCIUM  8.3*  --   --  8.0*   < > = values in this interval not displayed.    PT/INR:  Recent Labs    05/28/24 0420  LABPROT 16.6*  INR 1.3*   ABG    Component Value Date/Time   PHART 7.394 05/28/2024 0418   HCO3 27.4 05/28/2024 0418   TCO2 29 05/28/2024 0418   ACIDBASEDEF 1.0 05/19/2024 1154   O2SAT 87 05/28/2024 0418   CBG (last 3)  Recent Labs    05/27/24 2005 05/27/24 2333 05/28/24 0416   GLUCAP 105* 128* 135*    Assessment/Plan: S/P Procedure(s) (LRB): CREATION, TRACHEOSTOMY (N/A) Remains critically ill NEURO- sedated CV- in SR, CVP 3, no issues  No vegetations by TEE yesterday  Resume bivlarudin when ok from ENT RESP_ VDRF ARDS secondary to HAP- Candida   On VV ECMO- NO @ 30 ppm  CXR stable, CT in place with space on right post lobectomy  Vent settings per CCM ID_Candida glabrata in spututm on micafungin  Also on meropenem through 11/9 RENAL_ creatinine normal  Hypernatremia- free water  GI/ Nutrition- moderate protein calorie malnutrition  On tube feedings but reduced rate after tube dislodged  Tube replaced but not post pyloric- reposition today   LOS: 19 days    Keith Gibbs 05/28/2024

## 2024-05-28 NOTE — Anesthesia Postprocedure Evaluation (Signed)
 Anesthesia Post Note  Patient: Keith Gibbs  Procedure(s) Performed: CREATION, TRACHEOSTOMY     Patient location during evaluation: SICU Anesthesia Type: General Level of consciousness: sedated Pain management: pain level controlled Vital Signs Assessment: post-procedure vital signs reviewed and stable Respiratory status: patient remains intubated per anesthesia plan Cardiovascular status: stable Postop Assessment: no apparent nausea or vomiting Anesthetic complications: no   There were no known notable events for this encounter.  Last Vitals:  Vitals:   05/28/24 0815 05/28/24 0830  BP:    Pulse: 63 64  Resp: (!) 9 (!) 9  Temp: 37 C 37 C  SpO2: 91% 90%    Last Pain:  Vitals:   05/26/24 1600  TempSrc:   PainSc: 0-No pain                 Epifanio Lamar BRAVO

## 2024-05-29 ENCOUNTER — Inpatient Hospital Stay (HOSPITAL_COMMUNITY): Payer: MEDICAID

## 2024-05-29 DIAGNOSIS — Z515 Encounter for palliative care: Secondary | ICD-10-CM

## 2024-05-29 DIAGNOSIS — Z7189 Other specified counseling: Secondary | ICD-10-CM

## 2024-05-29 DIAGNOSIS — Z87891 Personal history of nicotine dependence: Secondary | ICD-10-CM | POA: Diagnosis not present

## 2024-05-29 DIAGNOSIS — J189 Pneumonia, unspecified organism: Secondary | ICD-10-CM

## 2024-05-29 DIAGNOSIS — Z902 Acquired absence of lung [part of]: Secondary | ICD-10-CM | POA: Diagnosis not present

## 2024-05-29 DIAGNOSIS — J939 Pneumothorax, unspecified: Secondary | ICD-10-CM | POA: Diagnosis not present

## 2024-05-29 DIAGNOSIS — J9601 Acute respiratory failure with hypoxia: Secondary | ICD-10-CM | POA: Diagnosis not present

## 2024-05-29 DIAGNOSIS — B49 Unspecified mycosis: Secondary | ICD-10-CM | POA: Diagnosis not present

## 2024-05-29 DIAGNOSIS — I10 Essential (primary) hypertension: Secondary | ICD-10-CM | POA: Diagnosis not present

## 2024-05-29 DIAGNOSIS — B371 Pulmonary candidiasis: Secondary | ICD-10-CM | POA: Diagnosis not present

## 2024-05-29 DIAGNOSIS — J9602 Acute respiratory failure with hypercapnia: Secondary | ICD-10-CM | POA: Diagnosis not present

## 2024-05-29 DIAGNOSIS — J80 Acute respiratory distress syndrome: Secondary | ICD-10-CM | POA: Diagnosis not present

## 2024-05-29 LAB — BASIC METABOLIC PANEL WITH GFR
Anion gap: 9 (ref 5–15)
Anion gap: 8 (ref 5–15)
BUN: 42 mg/dL — ABNORMAL HIGH (ref 8–23)
BUN: 37 mg/dL — ABNORMAL HIGH (ref 8–23)
CO2: 25 mmol/L (ref 22–32)
CO2: 24 mmol/L (ref 22–32)
Calcium: 7.9 mg/dL — ABNORMAL LOW (ref 8.9–10.3)
Calcium: 7.7 mg/dL — ABNORMAL LOW (ref 8.9–10.3)
Chloride: 117 mmol/L — ABNORMAL HIGH (ref 98–111)
Chloride: 114 mmol/L — ABNORMAL HIGH (ref 98–111)
Creatinine, Ser: 0.7 mg/dL (ref 0.61–1.24)
Creatinine, Ser: 0.65 mg/dL (ref 0.61–1.24)
GFR, Estimated: >60 mL/min (ref >60)
GFR, Estimated: >60 mL/min (ref >60)
Glucose, Bld: 145 mg/dL — ABNORMAL HIGH (ref 70–99)
Glucose, Bld: 134 mg/dL — ABNORMAL HIGH (ref 70–99)
Potassium: 3.7 mmol/L (ref 3.5–5.1)
Potassium: 3.9 mmol/L (ref 3.5–5.1)
Sodium: 151 mmol/L — ABNORMAL HIGH (ref 135–145)
Sodium: 146 mmol/L — ABNORMAL HIGH (ref 135–145)

## 2024-05-29 LAB — CBC
HCT: 24.1 % — ABNORMAL LOW (ref 39.0–52.0)
Hemoglobin: 7.7 g/dL — ABNORMAL LOW (ref 13.0–17.0)
MCH: 32.5 pg (ref 26.0–34.0)
MCHC: 32 g/dL (ref 30.0–36.0)
MCV: 101.7 fL — ABNORMAL HIGH (ref 80.0–100.0)
Platelets: 201 K/uL (ref 150–400)
RBC: 2.37 MIL/uL — ABNORMAL LOW (ref 4.22–5.81)
RDW: 17.2 % — ABNORMAL HIGH (ref 11.5–15.5)
WBC: 12.6 K/uL — ABNORMAL HIGH (ref 4.0–10.5)
nRBC: 0.6 % — ABNORMAL HIGH (ref 0.0–0.2)

## 2024-05-29 LAB — CBC WITH DIFFERENTIAL/PLATELET
Abs Immature Granulocytes: 0.17 K/uL — ABNORMAL HIGH (ref 0.00–0.07)
Abs Immature Granulocytes: 0.21 K/uL — ABNORMAL HIGH (ref 0.00–0.07)
Basophils Absolute: 0.1 K/uL (ref 0.0–0.1)
Basophils Absolute: 0.1 K/uL (ref 0.0–0.1)
Basophils Relative: 0 %
Basophils Relative: 1 %
Eosinophils Absolute: 1.6 K/uL — ABNORMAL HIGH (ref 0.0–0.5)
Eosinophils Absolute: 1.7 K/uL — ABNORMAL HIGH (ref 0.0–0.5)
Eosinophils Relative: 13 %
Eosinophils Relative: 13 %
HCT: 23.6 % — ABNORMAL LOW (ref 39.0–52.0)
HCT: 25.6 % — ABNORMAL LOW (ref 39.0–52.0)
Hemoglobin: 7.2 g/dL — ABNORMAL LOW (ref 13.0–17.0)
Hemoglobin: 8.2 g/dL — ABNORMAL LOW (ref 13.0–17.0)
Immature Granulocytes: 1 %
Immature Granulocytes: 2 %
Lymphocytes Relative: 20 %
Lymphocytes Relative: 22 %
Lymphs Abs: 2.6 K/uL (ref 0.7–4.0)
Lymphs Abs: 2.9 K/uL (ref 0.7–4.0)
MCH: 32.3 pg (ref 26.0–34.0)
MCH: 32.5 pg (ref 26.0–34.0)
MCHC: 30.5 g/dL (ref 30.0–36.0)
MCHC: 32 g/dL (ref 30.0–36.0)
MCV: 101.6 fL — ABNORMAL HIGH (ref 80.0–100.0)
MCV: 105.8 fL — ABNORMAL HIGH (ref 80.0–100.0)
Monocytes Absolute: 0.6 K/uL (ref 0.1–1.0)
Monocytes Absolute: 0.8 K/uL (ref 0.1–1.0)
Monocytes Relative: 5 %
Monocytes Relative: 6 %
Neutro Abs: 7.4 K/uL (ref 1.7–7.7)
Neutro Abs: 7.9 K/uL — ABNORMAL HIGH (ref 1.7–7.7)
Neutrophils Relative %: 56 %
Neutrophils Relative %: 61 %
Platelets: 214 K/uL (ref 150–400)
Platelets: 215 K/uL (ref 150–400)
RBC: 2.23 MIL/uL — ABNORMAL LOW (ref 4.22–5.81)
RBC: 2.52 MIL/uL — ABNORMAL LOW (ref 4.22–5.81)
RDW: 15.6 % — ABNORMAL HIGH (ref 11.5–15.5)
RDW: 16.6 % — ABNORMAL HIGH (ref 11.5–15.5)
WBC: 13 K/uL — ABNORMAL HIGH (ref 4.0–10.5)
WBC: 13 K/uL — ABNORMAL HIGH (ref 4.0–10.5)
nRBC: 0.8 % — ABNORMAL HIGH (ref 0.0–0.2)
nRBC: 0.8 % — ABNORMAL HIGH (ref 0.0–0.2)

## 2024-05-29 LAB — POCT I-STAT 7, (LYTES, BLD GAS, ICA,H+H)
Acid-Base Excess: 0 mmol/L (ref 0.0–2.0)
Acid-Base Excess: 0 mmol/L (ref 0.0–2.0)
Acid-Base Excess: 0 mmol/L (ref 0.0–2.0)
Acid-Base Excess: 1 mmol/L (ref 0.0–2.0)
Bicarbonate: 24 mmol/L (ref 20.0–28.0)
Bicarbonate: 24.6 mmol/L (ref 20.0–28.0)
Bicarbonate: 24.9 mmol/L (ref 20.0–28.0)
Bicarbonate: 25.5 mmol/L (ref 20.0–28.0)
Calcium, Ion: 1.2 mmol/L (ref 1.15–1.40)
Calcium, Ion: 1.21 mmol/L (ref 1.15–1.40)
Calcium, Ion: 1.21 mmol/L (ref 1.15–1.40)
Calcium, Ion: 1.23 mmol/L (ref 1.15–1.40)
HCT: 19 % — ABNORMAL LOW (ref 39.0–52.0)
HCT: 21 % — ABNORMAL LOW (ref 39.0–52.0)
HCT: 21 % — ABNORMAL LOW (ref 39.0–52.0)
HCT: 22 % — ABNORMAL LOW (ref 39.0–52.0)
Hemoglobin: 6.5 g/dL — CL (ref 13.0–17.0)
Hemoglobin: 7.1 g/dL — ABNORMAL LOW (ref 13.0–17.0)
Hemoglobin: 7.1 g/dL — ABNORMAL LOW (ref 13.0–17.0)
Hemoglobin: 7.5 g/dL — ABNORMAL LOW (ref 13.0–17.0)
O2 Saturation: 89 %
O2 Saturation: 89 %
O2 Saturation: 91 %
O2 Saturation: 92 %
Patient temperature: 37
Patient temperature: 37
Patient temperature: 37
Patient temperature: 37.1
Potassium: 3.7 mmol/L (ref 3.5–5.1)
Potassium: 3.8 mmol/L (ref 3.5–5.1)
Potassium: 4 mmol/L (ref 3.5–5.1)
Potassium: 4.4 mmol/L (ref 3.5–5.1)
Sodium: 148 mmol/L — ABNORMAL HIGH (ref 135–145)
Sodium: 149 mmol/L — ABNORMAL HIGH (ref 135–145)
Sodium: 154 mmol/L — ABNORMAL HIGH (ref 135–145)
Sodium: 154 mmol/L — ABNORMAL HIGH (ref 135–145)
TCO2: 25 mmol/L (ref 22–32)
TCO2: 26 mmol/L (ref 22–32)
TCO2: 26 mmol/L (ref 22–32)
TCO2: 27 mmol/L (ref 22–32)
pCO2 arterial: 35.5 mmHg (ref 32–48)
pCO2 arterial: 38.1 mmHg (ref 32–48)
pCO2 arterial: 39.6 mmHg (ref 32–48)
pCO2 arterial: 40.5 mmHg (ref 32–48)
pH, Arterial: 7.401 (ref 7.35–7.45)
pH, Arterial: 7.407 (ref 7.35–7.45)
pH, Arterial: 7.424 (ref 7.35–7.45)
pH, Arterial: 7.438 (ref 7.35–7.45)
pO2, Arterial: 56 mmHg — ABNORMAL LOW (ref 83–108)
pO2, Arterial: 56 mmHg — ABNORMAL LOW (ref 83–108)
pO2, Arterial: 60 mmHg — ABNORMAL LOW (ref 83–108)
pO2, Arterial: 62 mmHg — ABNORMAL LOW (ref 83–108)

## 2024-05-29 LAB — HEPATIC FUNCTION PANEL
ALT: 60 U/L — ABNORMAL HIGH (ref 0–44)
AST: 48 U/L — ABNORMAL HIGH (ref 15–41)
Albumin: 1.7 g/dL — ABNORMAL LOW (ref 3.5–5.0)
Alkaline Phosphatase: 166 U/L — ABNORMAL HIGH (ref 38–126)
Bilirubin, Direct: 1 mg/dL — ABNORMAL HIGH (ref 0.0–0.2)
Indirect Bilirubin: 0.7 mg/dL (ref 0.3–0.9)
Total Bilirubin: 1.7 mg/dL — ABNORMAL HIGH (ref 0.0–1.2)
Total Protein: 5 g/dL — ABNORMAL LOW (ref 6.5–8.1)

## 2024-05-29 LAB — CULTURE, BLOOD (ROUTINE X 2)
Culture: NO GROWTH
Culture: NO GROWTH

## 2024-05-29 LAB — CG4 I-STAT (LACTIC ACID): Lactic Acid, Venous: 0.5 mmol/L (ref 0.5–1.9)

## 2024-05-29 LAB — GLUCOSE, CAPILLARY
Glucose-Capillary: 134 mg/dL — ABNORMAL HIGH (ref 70–99)
Glucose-Capillary: 132 mg/dL — ABNORMAL HIGH (ref 70–99)
Glucose-Capillary: 128 mg/dL — ABNORMAL HIGH (ref 70–99)
Glucose-Capillary: 97 mg/dL (ref 70–99)
Glucose-Capillary: 117 mg/dL — ABNORMAL HIGH (ref 70–99)
Glucose-Capillary: 117 mg/dL — ABNORMAL HIGH (ref 70–99)

## 2024-05-29 LAB — APTT
aPTT: 59 s — ABNORMAL HIGH (ref 24–36)
aPTT: 61 s — ABNORMAL HIGH (ref 24–36)

## 2024-05-29 LAB — FIBRINOGEN: Fibrinogen: 592 mg/dL — ABNORMAL HIGH (ref 210–475)

## 2024-05-29 LAB — PROTIME-INR
INR: 1.7 — ABNORMAL HIGH (ref 0.8–1.2)
Prothrombin Time: 20.5 s — ABNORMAL HIGH (ref 11.4–15.2)

## 2024-05-29 LAB — MAGNESIUM: Magnesium: 2.2 mg/dL (ref 1.7–2.4)

## 2024-05-29 LAB — PREPARE RBC (CROSSMATCH)

## 2024-05-29 LAB — LACTATE DEHYDROGENASE: LDH: 360 U/L — ABNORMAL HIGH (ref 98–192)

## 2024-05-29 LAB — PHOSPHORUS: Phosphorus: 4.3 mg/dL (ref 2.5–4.6)

## 2024-05-29 LAB — SODIUM: Sodium: 148 mmol/L — ABNORMAL HIGH (ref 135–145)

## 2024-05-29 MED ORDER — SODIUM CHLORIDE 0.9% IV SOLUTION: Freq: Once | INTRAVENOUS | Status: AC

## 2024-05-29 MED ORDER — POTASSIUM CHLORIDE 20 MEQ PO PACK
40.0000 meq | PACK | Freq: Once | ORAL | Status: AC
  Administered 2024-05-29: 40 meq
  Filled 2024-05-29: qty 2

## 2024-05-29 MED ORDER — FREE WATER: 300.0000 mL | Status: DC

## 2024-05-29 MED ORDER — EPINEPHRINE HCL 5 MG/250ML IV SOLN IN NS
INTRAVENOUS | Status: AC
  Filled 2024-05-29: qty 250

## 2024-05-29 MED ORDER — MIDAZOLAM BOLUS VIA INFUSION
0.0000 mg | INTRAVENOUS | Status: DC | PRN
  Administered 2024-05-29 – 2024-05-31 (×5): 5 mg via INTRAVENOUS
  Administered 2024-06-01 – 2024-06-02 (×4): 2 mg via INTRAVENOUS

## 2024-05-29 MED ORDER — HYDROMORPHONE BOLUS VIA INFUSION
0.2500 mg | INTRAVENOUS | Status: DC | PRN
  Administered 2024-05-29 – 2024-06-04 (×34): 2 mg via INTRAVENOUS

## 2024-05-29 MED ORDER — ATROPINE SULFATE 1 MG/10ML IJ SOSY
PREFILLED_SYRINGE | INTRAMUSCULAR | Status: AC
  Filled 2024-05-29: qty 10

## 2024-05-29 MED ORDER — VASOPRESSIN 20 UNITS/100 ML INFUSION FOR SHOCK
INTRAVENOUS | Status: AC
  Administered 2024-05-29: 0.04 [IU]/min via INTRAVENOUS
  Filled 2024-05-29: qty 100

## 2024-05-29 MED ORDER — FREE WATER
300.0000 mL | Status: DC
  Administered 2024-05-29 (×4): 300 mL

## 2024-05-29 MED ORDER — POTASSIUM CHLORIDE 20 MEQ PO PACK: 20.0000 meq | PACK | Freq: Once | ORAL | Status: DC

## 2024-05-29 MED ORDER — FREE WATER
200.0000 mL | Status: DC
  Administered 2024-05-29: 200 mL

## 2024-05-29 MED ORDER — FUROSEMIDE 10 MG/ML IJ SOLN
8.0000 mg/h | INTRAVENOUS | Status: DC
  Administered 2024-05-29: 4 mg/h via INTRAVENOUS
  Administered 2024-05-31 – 2024-06-02 (×4): 8 mg/h via INTRAVENOUS
  Filled 2024-05-29 (×5): qty 20

## 2024-05-29 MED ORDER — FREE WATER: 200.0000 mL | Status: DC

## 2024-05-29 MED ORDER — EPINEPHRINE 1 MG/10ML IV SOSY
PREFILLED_SYRINGE | INTRAVENOUS | Status: DC
Start: 2024-05-29 — End: 2024-05-29
  Filled 2024-05-29: qty 10

## 2024-05-29 MED ORDER — FREE WATER
200.0000 mL | Status: DC
  Administered 2024-05-29 – 2024-05-31 (×19): 200 mL

## 2024-05-29 MED ORDER — FREE WATER: 400.0000 mL | Status: DC

## 2024-05-29 MED ORDER — POTASSIUM CHLORIDE 20 MEQ PO PACK
40.0000 meq | PACK | Freq: Once | ORAL | Status: AC
  Administered 2024-05-29: 40 meq via ORAL
  Filled 2024-05-29: qty 2

## 2024-05-29 MED ORDER — VASOPRESSIN 20 UNITS/100 ML INFUSION FOR SHOCK
0.0000 [IU]/min | INTRAVENOUS | Status: DC
  Administered 2024-05-29 – 2024-06-04 (×14): 0.04 [IU]/min via INTRAVENOUS
  Filled 2024-05-29 (×14): qty 100

## 2024-05-29 NOTE — Progress Notes (Signed)
 PHARMACY - ANTICOAGULATION CONSULT NOTE  Pharmacy Consult for heparin  >> bivalirudin  Indication: VTE, VV ECMO (10/29 - current)  Allergies  Allergen Reactions   Asenapine Other (See Comments) and Nausea And Vomiting    Increased tremors   Dextromethorphan Hbr    Guaifenesin     Latuda [Lurasidone Hcl] Other (See Comments)    Tremors     Lurasidone Other (See Comments) and Hives    Other Reaction(s): Angioedema   Phenylephrine      Patient Measurements: Height: 6' 3 (190.5 cm) Weight: 106 kg (233 lb 11 oz) IBW/kg (Calculated) : 84.5  Vital Signs: Temp: 98.6 F (37 C) (11/05 1600) BP: 127/52 (11/05 0900) Pulse Rate: 42 (11/05 1600)  Labs: Recent Labs    05/27/24 0433 05/27/24 1203 05/28/24 0420 05/28/24 0739 05/28/24 1524 05/28/24 1539 05/28/24 2100 05/28/24 2351 05/28/24 2355 05/29/24 0428 05/29/24 0445 05/29/24 1623 05/29/24 1629  HGB 8.7*   < > 8.3*   < > 8.6*   < >  --    < > 7.2*   < > 8.2* 7.7* 7.1*  HCT 28.1*   < > 26.6*   < > 27.5*   < >  --    < > 23.6*   < > 25.6* 24.1* 21.0*  PLT 209   < > 217  --  228  --   --   --  214  --  215 201  --   APTT 37*   < > 35   < >  --   --  71*  --   --   --  59* 61*  --   LABPROT 16.6*  --  16.6*  --   --   --   --   --   --   --  20.5*  --   --   INR 1.3*  --  1.3*  --   --   --   --   --   --   --  1.7*  --   --   CREATININE 0.65   < > 0.81  --  0.88  --   --   --   --   --  0.70 0.65  --    < > = values in this interval not displayed.    Estimated Creatinine Clearance: 124.5 mL/min (by C-G formula based on SCr of 0.65 mg/dL).   Medical History: Past Medical History:  Diagnosis Date   Anginal pain    Anxiety    Arthritis    Bipolar 1 disorder (HCC)    Bursitis    CAD (coronary artery disease)    Stent placed 2017   Cancer (HCC)    Chronic pain    COPD (chronic obstructive pulmonary disease) (HCC)    Current use of long term anticoagulation    DAPT (ASA + clopidogrel )   Depression    Diverticulitis     Dyspnea    GERD (gastroesophageal reflux disease)    Grade I diastolic dysfunction    Hepatitis C 2012   No longer has Hep C   HLD (hyperlipidemia)    Hypertension    MI (myocardial infarction) (HCC)    Polysubstance abuse (HCC)    cocaine, marijuana, ETOH   PUD (peptic ulcer disease)    S/P angioplasty with stent 06/10/2016   a.) 90% stenosis of pLAD to mLAD - 2.5 x 18 mm Xience Alpine (DES x 1) placed to pLAD   S/P PTCA (percutaneous transluminal coronary angioplasty)  12/04/2019   a.) 60% in stent restenosis of DES to pLAD; LVEF 65%.   Schizophrenia (HCC)    Stroke (HCC)    Tremors    generalized   Valvular insufficiency    a.) Mild MR, TR, PR; mild to moderate AR on 03/05/2018 TTE      Assessment: 63 yoM admitted for RUL lobectomy.  Pt intubated, now having difficulty with ventilation. Placed on VV ECMO 10/29 in the evening. Pharmacy to dose IV bivalirudin .   Previously on therapeutic heparin  (HL 0.33 on 1700/h) for acute DVT of gastrocnemius vein  > changed to bivalirudin  with ECMO cannulation  S/p circuit change 11/4  aPTT 59 sec for new goal of ~60 sec. No bleeding noted.  Discussed with team, will increase goal to 60-70 seconds given minimal trach bleeding today.  PM: aPTT 61 sec is therapeutic. No issues per RN.  Goal of Therapy:  aPTT goal increased to 60-70 sec (11/4 post circuit change) Monitor platelets by anticoagulation protocol: Yes   Plan:  Continue bivalirudin  at 0.09 mg/kg/hr Check aPTT with next labs at 0500. Q12h aPTT and CBC  Rocky Slade, PharmD, BCPS Clinical Pharmacist  05/29/2024 6:18 PM   [facility] pharmacy phone numbers are listed on [email or web address]

## 2024-05-29 NOTE — Consult Note (Signed)
 Palliative Care Consult Note                                  Date: 05/29/2024   Patient Name: Keith Gibbs  DOB: 01/24/1961  MRN: [id number]  Age / Sex: 63 y.o., male  PCP: Lorren Greig PARAS, NP Referring Physician: Kerrin Elspeth BROCKS, MD  Reason for Consultation: Establishing goals of care  HPI/Patient Profile: 63 y.o. male  with past medical history of COPD, former tobacco use, CAD, hepatitis C, HTN, HLD, bipolar 1 disorder, and schizophrenia.  He was admitted on 05/09/2024 for lobectomy of RUL nodule.  He was initially progressing as expected postoperatively, however on 10/21 he decompensated with worsening hypoxemia.  He ultimately went into ARDS and was cannulated for VV ECMO on 10/29.  Palliative Medicine has been consulted for goals of care discussions and complex medical decision making.   Clinical Assessment and Goals of Care:   Extensive chart review has been completed prior to meeting with patient/family including labs, vital signs, imaging, progress/consult notes, orders, medications and available advance directive documents.    Discussed with Dr. Gretta and patient's RN. Patient assessed. Neurologically, he opens his eyes with stimulation, but does not have purposeful movement.   I met with *** to discuss diagnosis, prognosis, GOC, EOL wishes, disposition, and options.  I introduced Palliative Medicine as specialized medical care for people living with serious illness. It focuses on providing relief from the symptoms and stress of a serious illness.   Created space and opportunity for family to express thoughts and feelings regarding current medical situation. Values and goals of care were attempted to be elicited.  A discussion was had today regarding advanced directives. We discussed code status and scopes of care. We discussed the difference between full scope versus limited interventions versus comfort care. The MOST  form was introduced and discussed.   Life Review: ***  Functional Status: ***  Patient/Family Understanding of Illness: ***  Discussion: *** We discussed patient's current illness and what it means in the larger context of his/her ongoing co-morbidities. Current clinical status was reviewed. Natural disease trajectory of *** was discussed.  Discussed the importance of continued conversation with the medical team regarding overall plan of care and treatment options, ensuring decisions are within the context of the patients values and GOCs.  Questions and concerns addressed. Patient/family encouraged to call with questions or concerns.   Review of Systems  Unable to perform ROS   Objective:   Primary Diagnoses: Present on Admission:  Anxiety disorder due to medical condition   Physical Exam Vitals reviewed.  Constitutional:      General: He is not in acute distress.    Appearance: He is ill-appearing.     Interventions: He is sedated.  Cardiovascular:     Rate and Rhythm: Bradycardia present.  Pulmonary:     Comments: Trach/vent    Vital Signs:  BP (!) 127/52   Pulse (!) 44   Temp 98.6 F (37 C)   Resp 15   Ht 6' 3 (1.905 m)   Wt 106 kg   SpO2 90%   BMI 29.21 kg/m   Palliative Assessment/Data: ***     Assessment & Plan:   SUMMARY OF RECOMMENDATIONS   Continue full scope care Patient's mother understands he is high risk to deteriorate Ongoing palliative support  Primary Decision Maker: NEXT OF KIN - mother, but she seems to rely  heavily on her daughters (patient's sisters) for major decisions  Existing Vynca/ACP Documentation: None  Code Status/Advance Care Planning: {Palliative Code status:23503}  Symptom Management:  ***  Prognosis:  {Palliative Care Prognosis:23504}  Discharge Planning:  {Palliative dispostion:23505}   Discussed with: ***    Thank you for allowing us  to participate in the care of Keith Gibbs   Time  Total: ***  Greater than 50%  of this time was spent counseling and coordinating care related to the above assessment and plan.  Signed by: Recardo Loll, NP Palliative Medicine Team  Team Phone # [phone]  For individual providers, please see AMION

## 2024-05-29 NOTE — Progress Notes (Signed)
 PHARMACY - ANTICOAGULATION CONSULT NOTE  Pharmacy Consult for heparin  >> bivalirudin  Indication: VTE, VV ECMO (10/29 - current)  Allergies  Allergen Reactions   Asenapine Other (See Comments) and Nausea And Vomiting    Increased tremors   Dextromethorphan Hbr    Guaifenesin     Latuda [Lurasidone Hcl] Other (See Comments)    Tremors     Lurasidone Other (See Comments) and Hives    Other Reaction(s): Angioedema   Phenylephrine      Patient Measurements: Height: 6' 3 (190.5 cm) Weight: 106 kg (233 lb 11 oz) IBW/kg (Calculated) : 84.5  Vital Signs: Temp: 98.6 F (37 C) (11/05 1045) Temp Source: Bladder (11/05 0415) BP: 127/52 (11/05 0900) Pulse Rate: 45 (11/05 1210)  Labs: Recent Labs    05/27/24 0433 05/27/24 1203 05/28/24 0420 05/28/24 0739 05/28/24 1205 05/28/24 1304 05/28/24 1524 05/28/24 1539 05/28/24 2100 05/28/24 2351 05/28/24 2355 05/29/24 0428 05/29/24 0445  HGB 8.7*   < > 8.3*   < >  --    < > 8.6*   < >  --    < > 7.2* 7.5* 8.2*  HCT 28.1*   < > 26.6*   < >  --    < > 27.5*   < >  --    < > 23.6* 22.0* 25.6*  PLT 209   < > 217  --   --   --  228  --   --   --  214  --  215  APTT 37*   < > 35  --  81*  --   --   --  71*  --   --   --  59*  LABPROT 16.6*  --  16.6*  --   --   --   --   --   --   --   --   --  20.5*  INR 1.3*  --  1.3*  --   --   --   --   --   --   --   --   --  1.7*  CREATININE 0.65   < > 0.81  --   --   --  0.88  --   --   --   --   --  0.70   < > = values in this interval not displayed.    Estimated Creatinine Clearance: 124.5 mL/min (by C-G formula based on SCr of 0.7 mg/dL).   Medical History: Past Medical History:  Diagnosis Date   Anginal pain    Anxiety    Arthritis    Bipolar 1 disorder (HCC)    Bursitis    CAD (coronary artery disease)    Stent placed 2017   Cancer (HCC)    Chronic pain    COPD (chronic obstructive pulmonary disease) (HCC)    Current use of long term anticoagulation    DAPT (ASA + clopidogrel )    Depression    Diverticulitis    Dyspnea    GERD (gastroesophageal reflux disease)    Grade I diastolic dysfunction    Hepatitis C 2012   No longer has Hep C   HLD (hyperlipidemia)    Hypertension    MI (myocardial infarction) (HCC)    Polysubstance abuse (HCC)    cocaine, marijuana, ETOH   PUD (peptic ulcer disease)    S/P angioplasty with stent 06/10/2016   a.) 90% stenosis of pLAD to mLAD - 2.5 x 18 mm Xience Alpine (DES x  1) placed to pLAD   S/P PTCA (percutaneous transluminal coronary angioplasty) 12/04/2019   a.) 60% in stent restenosis of DES to pLAD; LVEF 65%.   Schizophrenia (HCC)    Stroke (HCC)    Tremors    generalized   Valvular insufficiency    a.) Mild MR, TR, PR; mild to moderate AR on 03/05/2018 TTE      Assessment: 63 yoM admitted for RUL lobectomy.  Pt intubated, now having difficulty with ventilation. Placed on VV ECMO 10/29 in the evening. Pharmacy to dose IV bivalirudin .   Previously on therapeutic heparin  (HL 0.33 on 1700/h) for acute DVT of gastrocnemius vein  > changed to bivalirudin  with ECMO cannulation  S/p circuit change 11/4  aPTT 59 sec for new goal of ~60 sec. No bleeding noted.  Discussed with team, will increase goal to 60-70 seconds given minimal trach bleeding today.  Goal of Therapy:  aPTT goal increased to 60-70 sec (11/4 post circuit change) Monitor platelets by anticoagulation protocol: Yes   Plan:  Continue bivalirudin  to 0.09 mg/kg/hr Check aPTT with next labs at 1700. Q12h aPTT and CBC  Harlene Barlow, Berdine JONETTA CORP, [facility] Clinical Pharmacist  05/29/2024 3:54 PM   [facility] pharmacy phone numbers are listed on [email or web address]

## 2024-05-29 NOTE — Progress Notes (Signed)
 Pt transported to and from CT2 from 2H04 without event

## 2024-05-29 NOTE — CV Procedure (Signed)
 ECMO NOTE:   Indication: Respiratory failure due to ARDS   Initial cannulation date: 05/22/24   ECMO type: VV ECMO   Dual lumen inflow/return cannula:   1) 41F [place] placed in RIJ    Daily data:   Flow 4.54L RPM 3485 [address]  9L Pven -80   Labs:   ABG    Component Value Date/Time   PHART 7.407 05/29/2024 0428   PCO2ART 40.5 05/29/2024 0428   PO2ART 56 (L) 05/29/2024 0428   HCO3 25.5 05/29/2024 0428   TCO2 27 05/29/2024 0428   ACIDBASEDEF 2.0 05/28/2024 1541   O2SAT 89 05/29/2024 0428    Hgb 8.2 Platelets 215 LDH 360 PTT 59, goal of 60.   Plan:   Continue VV ECMO support Await lung recovery, expect prolonged course CT imaging for intermittent hypotension, bradycardia Continue abx/antifungals, 14 day course of meropenem than off Off paralytics, wean versed  today, target of 5 Nitric oxide through the circuit Circuit looks great Start IV lasix gtt   Morene JINNY Brownie, MD  11:44 AM

## 2024-05-29 NOTE — Progress Notes (Signed)
 Advanced Heart Failure Rounding Note   Subjective:     Placed on VV ECMO day 05/22/2024  Sedation weaned currently to versed  12, diluadid at 4, propofol  at 30. Ketamine  and nimbex remain off.  Yesterday with circuit change for extremely reduced oxygenator efficiency with significant buildup. Improvement in post gas from 151 to 540 with pre gas of 27. PaO2 and oxygenation correspondingly improved.  Overnight had progressive bradycardia and worsening hypotension, short run of NSVT. Bradycardia was gradual, starting in the 70s and slowly decreasing to the 40s post change.  Having occasional hypotensive episodes with no drop in VV ECMO Flows. Improve with rebound hypertension.   CT head unremarkable.   Volume up over the past few days, worsening airspace disease, start gentle diuresis.    Objective:   Weight Range:  Vital Signs:   Temp:  [97.3 F (36.3 C)-98.8 F (37.1 C)] 98.6 F (37 C) (11/05 0900) Pulse Rate:  [37-75] 44 (11/05 0900) Resp:  [0-22] 15 (11/05 1042) BP: (65-140)/(41-66) 127/52 (11/05 0900) SpO2:  [76 %-97 %] 90 % (11/05 0900) Arterial Line BP: (66-156)/(32-63) 124/51 (11/05 0900) FiO2 (%):  [60 %-80 %] 60 % (11/05 1042) Last BM Date : 05/28/24  Weight change: Filed Weights   05/26/24 0630 05/27/24 0710 05/28/24 0500  Weight: 110.3 kg 108.5 kg 106 kg    Intake/Output:   Intake/Output Summary (Last 24 hours) at 05/29/2024 1133 Last data filed at 05/29/2024 1100 Gross per 24 hour  Intake 4852.28 ml  Output 2605 ml  Net 2247.28 ml     Physical Exam: General:  intubated/sedated HEENT: + ETT Neck: RIJ cannula in place, no bleeding, good color change CV: bradycardia, POCUS with normal LV function, thigh edema Lungs: diminished, extremely limited chest wall movement Abdomen: soft, nontender, nondistended Neuro:sedated    Labs: Basic Metabolic Panel: Recent Labs  Lab 05/23/24 0445 05/23/24 0453 05/24/24 0401 05/24/24 0408 05/25/24 0411  05/25/24 0417 05/26/24 0450 05/26/24 0802 05/27/24 0433 05/27/24 1203 05/27/24 1700 05/27/24 1817 05/28/24 0420 05/28/24 0739 05/28/24 1524 05/28/24 1539 05/28/24 1717 05/28/24 1840 05/28/24 2351 05/29/24 0428 05/29/24 0445  NA 146*   < > 147*   < > 148*   < > 148*   < > 150*   < > 150*   < > 149*   < > 152*   < > 151* 153* 154* 154* 151*  K 5.3*   < > 5.1   < > 4.9   < > 4.7   < > 4.7   < > 4.7   < > 4.4   < > 4.3   < > 4.3 4.1 3.8 3.7 3.7  CL 106   < > 107   < > 111   < > 112*   < > 113*  --  113*  --  113*  --  118*  --   --   --   --   --  117*  CO2 29   < > 29   < > 29   < > 28   < > 29  --  26  --  26  --  26  --   --   --   --   --  25  GLUCOSE 113*   < > 109*   < > 124*   < > 115*   < > 108*  --  84  --  134*  --  136*  --   --   --   --   --  145*  BUN 46*   < > 50*   < > 48*   < > 42*   < > 38*  --  38*  --  47*  --  51*  --   --   --   --   --  42*  CREATININE 0.88   < > 0.84   < > 0.85   < > 0.71   < > 0.65  --  0.78  --  0.81  --  0.88  --   --   --   --   --  0.70  CALCIUM  7.5*   < > 8.2*   < > 8.2*   < > 8.1*   < > 8.3*  --  8.3*  --  8.0*  --  8.1*  --   --   --   --   --  7.9*  MG 3.1*  --  3.0*  --  2.5*  --  2.3  --   --   --   --   --   --   --   --   --   --   --   --   --  2.2  PHOS 3.1  --  3.7  --   --   --   --   --   --   --   --   --   --   --   --   --   --   --   --   --  4.3   < > = values in this interval not displayed.    Liver Function Tests: Recent Labs  Lab 05/25/24 0411 05/26/24 0450 05/27/24 0433 05/28/24 0420 05/29/24 0445  AST 57* 117* 89* 78* 48*  ALT 59* 84* 102* 85* 60*  ALKPHOS 92 111 146* 180* 166*  BILITOT 0.5 0.8 1.3* 2.8* 1.7*  PROT 5.2* 5.1* 5.1* 5.0* 5.0*  ALBUMIN  2.1* 1.9* 1.8* 1.8* 1.7*   No results for input(s): LIPASE, AMYLASE in the last 168 hours. No results for input(s): AMMONIA in the last 168 hours.  CBC: Recent Labs  Lab 05/26/24 0450 05/26/24 0802 05/27/24 0433 05/27/24 1203 05/27/24 1700  05/27/24 1817 05/28/24 0420 05/28/24 0739 05/28/24 1524 05/28/24 1539 05/28/24 1849 05/28/24 2351 05/28/24 2355 05/29/24 0428 05/29/24 0445  WBC 24.2*   < > 21.5*  --  21.1*  --  21.2*  --  19.2*  --   --   --  13.0*  --  13.0*  NEUTROABS 18.7*  --  16.0*  --   --   --  16.5*  --   --   --   --   --  7.9*  --  7.4  HGB 8.9*   < > 8.7*   < > 8.7*   < > 8.3*   < > 8.6*   < > 7.6* 6.5* 7.2* 7.5* 8.2*  HCT 28.8*   < > 28.1*   < > 27.8*   < > 26.6*   < > 27.5*   < > 24.2* 19.0* 23.6* 22.0* 25.6*  MCV 104.0*   < > 105.2*  --  104.9*  --  104.3*  --  105.0*  --   --   --  105.8*  --  101.6*  PLT 268   < > 209  --  209  --  217  --  228  --   --   --  214  --  215   < > = values in this interval not displayed.    Cardiac Enzymes: No results for input(s): CKTOTAL, CKMB, CKMBINDEX, TROPONINI in the last 168 hours.  BNP: BNP (last 3 results) Recent Labs    03/20/24 1419  BNP 20.6    ProBNP (last 3 results) No results for input(s): PROBNP in the last 8760 hours.      Medications:     Scheduled Medications:  arformoterol  15 mcg Nebulization BID   artificial tears  1 Application Both Eyes Q8H   Chlorhexidine  Gluconate Cloth  6 each Topical Daily   clonazePAM  1 mg Oral Q8H   EPINEPHrine  NaCl       feeding supplement (PROSource TF20)  60 mL Per Tube QID   fiber supplement (BANATROL TF)  60 mL Per Tube BID   free water   300 mL Per Tube Q2H   gabapentin   600 mg Oral Daily   And   gabapentin   1,200 mg Oral QHS   insulin aspart  0-15 Units Subcutaneous Q4H   mupirocin  ointment  1 Application Nasal BID   OLANZapine   5 mg Intravenous Daily   mouth rinse  15 mL Mouth Rinse Q2H   oxyCODONE   10 mg Oral QID   pantoprazole  (PROTONIX ) IV  40 mg Intravenous QHS   polyethylene glycol  17 g Oral Daily   potassium chloride   40 mEq Per Tube Once   revefenacin  175 mcg Nebulization Daily   senna-docusate  2 tablet Oral QHS   sodium chloride  flush  10-40 mL Intracatheter Q12H    sodium chloride  flush  10-40 mL Intracatheter Q12H    Infusions:  albumin  human Stopped (05/28/24 1259)   bivalirudin  (ANGIOMAX ) 250 mg in sodium chloride  0.9 % 500 mL (0.5 mg/mL) infusion 0.09 mg/kg/hr (05/29/24 0800)   feeding supplement (VITAL 1.5 CAL) 1,000 mL (05/29/24 0900)   furosemide (LASIX) 200 mg in dextrose  5 % 100 mL (2 mg/mL) infusion     HYDROmorphone  5 mg/hr (05/29/24 0800)   meropenem (MERREM) IV Stopped (05/29/24 0605)   micafungin (MYCAMINE) 200 mg in sodium chloride  0.9 % 100 mL IVPB Stopped (05/28/24 1846)   midazolam  12 mg/hr (05/29/24 0800)   niCARDipine Stopped (05/24/24 2221)   norepinephrine (LEVOPHED) Adult infusion 6 mcg/min (05/29/24 0800)   propofol  (DIPRIVAN ) infusion 50 mcg/kg/min (05/29/24 0823)   vasopressin  0.04 Units/min (05/29/24 0800)    PRN Medications: albumin  human, EPINEPHrine  NaCl, hydrALAZINE , HYDROmorphone , hydrOXYzine , ipratropium-albuterol , lactulose, midazolam , ondansetron  (ZOFRAN ) IV, mouth rinse, mouth rinse, sodium chloride  flush, sodium chloride  flush   Assessment/Plan:   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 - s/p VV ECMO cannulation 05/22/24 - VV ECMO flows look good, significant fibrin buildup on previous oxygenator - S/p circuit change on 11/4, improvement in post gas from 151 to 540  - Increase bival today, goal PTT of 60 - Nitric oxide through the circuit for anticoagulation and long pump run - Continue to wean sedation, suspect he will need a long time to have the versed  wear off - TEE with cannula adjustment 11/3 - TV remain extremely low - CCM managing vent and R chest tube - Trach 11/3, appreciate ENT - continue meropenem for 14 days, micafungin for the foreseeable future. Sensititives for candida pending - Transfuse hgb < 7.5 - Start IV lasix gtt at 4 given worsening volume status  Bradycardia/hypotension: - Intermittent, no change in rhythm during this time - Persistent  bradycardia, may be related to ketamine  being off? -  CT head/chest/abdomen/pelvis  RUL lung CA s/p lobectomy COPD with ongoing tobacco use Bipolar d/o CAD Candida glabrata fungemia/PNA - ID following - on micafungin for fungemia, ID following, course to be determined given indwelling lines - steroids off - Continue meropenem for 14 day course  HTN - ensure adequate sedation under paralytics - add cardene as needed  Remains critically ill with ARDS/fungal PNA/PTX on top of severe underlying lung disease. Long run expected  CRITICAL CARE Performed by: Morene JINNY Brownie   Total critical care time: 40 minutes  Critical care time was exclusive of separately billable procedures and treating other patients.  Critical care was necessary to treat or prevent imminent or life-threatening deterioration.  Critical care was time spent personally by me on the following activities: development of treatment plan with patient and/or surrogate as well as nursing, discussions with consultants, evaluation of patient's response to treatment, examination of patient, obtaining history from patient or surrogate, ordering and performing treatments and interventions, ordering and review of laboratory studies, ordering and review of radiographic studies, pulse oximetry and re-evaluation of patient's condition.

## 2024-05-29 NOTE — Progress Notes (Signed)
 2 Days Post-Op Procedure(s) (LRB): CREATION, TRACHEOSTOMY (N/A) Subjective: Intubated, sedated.  Has had some seizure like activity  Objective: Vital signs in last 24 hours: Temp:  [97.3 F (36.3 C)-98.8 F (37.1 C)] 98.6 F (37 C) (11/05 0658) Pulse Rate:  [38-75] 39 (11/05 0658) Cardiac Rhythm: Normal sinus rhythm (11/04 1956) Resp:  [0-22] 15 (11/05 0758) BP: (86-140)/(42-66) 121/66 (11/05 0600) SpO2:  [76 %-97 %] 93 % (11/05 0658) Arterial Line BP: (81-156)/(35-63) 130/50 (11/05 0658) FiO2 (%):  [60 %-80 %] 80 % (11/05 0758)  Hemodynamic parameters for last 24 hours: CVP:  [1 mmHg-31 mmHg] 12 mmHg  Intake/Output from previous day: 11/04 0701 - 11/05 0700 In: 4785.5 [I.V.:1962.8; Blood:350; NG/GT:1826.3; IV Piggyback:646.4] Out: 2485 [Urine:2265; Chest Tube:220] Intake/Output this shift: Total I/O In: 200 [NG/GT:200] Out: -   General appearance: ill-appearing Neurologic: sedated Heart: brady, regular Lungs: minimal air movement Abdomen: soft No air leak  Lab Results: Recent Labs    05/28/24 2355 05/29/24 0428 05/29/24 0445  WBC 13.0*  --  13.0*  HGB 7.2* 7.5* 8.2*  HCT 23.6* 22.0* 25.6*  PLT 214  --  215   BMET:  Recent Labs    05/28/24 1524 05/28/24 1539 05/29/24 0428 05/29/24 0445  NA 152*   < > 154* 151*  K 4.3   < > 3.7 3.7  CL 118*  --   --  117*  CO2 26  --   --  25  GLUCOSE 136*  --   --  145*  BUN 51*  --   --  42*  CREATININE 0.88  --   --  0.70  CALCIUM  8.1*  --   --  7.9*   < > = values in this interval not displayed.    PT/INR:  Recent Labs    05/29/24 0445  LABPROT 20.5*  INR 1.7*   ABG    Component Value Date/Time   PHART 7.407 05/29/2024 0428   HCO3 25.5 05/29/2024 0428   TCO2 27 05/29/2024 0428   ACIDBASEDEF 2.0 05/28/2024 1541   O2SAT 89 05/29/2024 0428   CBG (last 3)  Recent Labs    05/28/24 2349 05/29/24 0419 05/29/24 0753  GLUCAP 153* 134* 132*    Assessment/Plan: S/P Procedure(s) (LRB): CREATION,  TRACHEOSTOMY (N/A) Remains critically ill NEURO- changes overnight as sedation modified  Head CT this AM CV- now bradycardic with labile BP  CVP 0 at present  [place] may be related to dc of ketamine  RESP- VDRF with ARDS secondary to fungal pneumonia  VV ECMO- oxygenation better since circuit change yesterday  CXR relatively stable, may have some fluid in space  Will scan chest while down for head CT  Vent- minimal tidal volume, PEEP 10 ID- Candida glabrata in sputum, blood  On micafungin,  meropenem  WBC stable at 13K RENAL- BUN down slightly, creatinine normal  Hypernatremia persists- increase free water   On Lasix drip ENDO- CBG well controlled GI/ protein calorie malnutrition- on TF   LOS: 20 days    Keith Gibbs 05/29/2024

## 2024-05-29 NOTE — Progress Notes (Signed)
 [facility]    Date of Admission:  05/09/2024   Total days of antibiotics 13 of antipseudomonal coverage   ID: Keith Gibbs is a 63 y.o. male with   Principal Problem:   S/P lobectomy of lung Active Problems:   Status post robot-assisted surgical procedure   Acute hypoxic respiratory failure (HCC)   Anxiety disorder due to medical condition   Malnutrition of moderate degree   Acute respiratory distress syndrome (ARDS) due to COVID-19 virus (HCC)   Candidemia (HCC)   Central line infection   Pseudomonas aeruginosa infection   Ventilator associated pneumonia (HCC)   Patient receiving extracorporeal membrane oxygenation (ECMO)    Subjective: Being weaned off of versed . Had episode of bradycardia, started on low dose levofed. Continues on VV ECMO support  Medications:   arformoterol  15 mcg Nebulization BID   artificial tears  1 Application Both Eyes Q8H   Chlorhexidine  Gluconate Cloth  6 each Topical Daily   clonazePAM  1 mg Oral Q8H   EPINEPHrine  NaCl       feeding supplement (PROSource TF20)  60 mL Per Tube QID   fiber supplement (BANATROL TF)  60 mL Per Tube BID   free water   300 mL Per Tube Q2H   gabapentin   600 mg Oral Daily   And   gabapentin   1,200 mg Oral QHS   insulin aspart  0-15 Units Subcutaneous Q4H   mupirocin  ointment  1 Application Nasal BID   OLANZapine   5 mg Intravenous Daily   mouth rinse  15 mL Mouth Rinse Q2H   oxyCODONE   10 mg Oral QID   pantoprazole  (PROTONIX ) IV  40 mg Intravenous QHS   polyethylene glycol  17 g Oral Daily   revefenacin  175 mcg Nebulization Daily   senna-docusate  2 tablet Oral QHS   sodium chloride  flush  10-40 mL Intracatheter Q12H   sodium chloride  flush  10-40 mL Intracatheter Q12H    Objective: Vital signs in last 24 hours: Temp:  [97.3 F (36.3 C)-98.8 F (37.1 C)] 98.6 F (37 C) (11/05 1600) Pulse Rate:  [37-66] 42 (11/05 1600) Resp:  [0-22] 13 (11/05 1600) BP: (65-140)/(41-66)  127/52 (11/05 0900) SpO2:  [84 %-97 %] 94 % (11/05 1600) Arterial Line BP: (66-156)/(32-63) 142/57 (11/05 1600) FiO2 (%):  [60 %-80 %] 60 % (11/05 1538) Physical Exam  Constitutional: sedated; He appears well-developed and well-nourished. No distress.  HENT:  Mouth/Throat: OETT Cardiovascular: Normal rate, regular rhythm and normal heart sounds. Exam reveals no gallop and no friction rub.  No murmur heard.  Pulmonary/Chest: Effort normal and breath sounds normal. No respiratory distress. He has no wheezes. Lines in place Abdominal: Soft. Bowel sounds are decreased. He exhibits no distension. There is no tenderness.  Neurological: He is alert and oriented to person, place, and time.  Skin: Skin is warm and dry. No rash noted. No erythema.    Lab Results Recent Labs    05/28/24 1524 05/28/24 1539 05/28/24 2355 05/29/24 0428 05/29/24 0445 05/29/24 1451 05/29/24 1629  WBC 19.2*  --  13.0*  --  13.0*  --   --   HGB 8.6*   < > 7.2*   < > 8.2*  --  7.1*  HCT 27.5*   < > 23.6*   < > 25.6*  --  21.0*  NA 152*   < >  --    < > 151* 148* 149*  K 4.3   < >  --    < >  3.7  --  4.0  CL 118*  --   --   --  117*  --   --   CO2 26  --   --   --  25  --   --   BUN 51*  --   --   --  42*  --   --   CREATININE 0.88  --   --   --  0.70  --   --    < > = values in this interval not displayed.   Liver Panel Recent Labs    05/28/24 0420 05/29/24 0445  PROT 5.0* 5.0*  ALBUMIN  1.8* 1.7*  AST 78* 48*  ALT 85* 60*  ALKPHOS 180* 166*  BILITOT 2.8* 1.7*  BILIDIR 2.0* 1.0*  IBILI 0.8 0.7   Sedimentation Rate No results for input(s): ESRSEDRATE in the last 72 hours. C-Reactive Protein No results for input(s): CRP in the last 72 hours.  Microbiology: 10/31 blood cx ngtd MRSA negative Studies/Results: CT CHEST ABDOMEN PELVIS WO CONTRAST Result Date: 05/29/2024 EXAM: CT CHEST, ABDOMEN AND PELVIS WITHOUT CONTRAST 05/29/2024 10:22:11 AM TECHNIQUE: CT of the chest, abdomen and pelvis was  performed without the administration of intravenous contrast. Multiplanar reformatted images are provided for review. Automated exposure control, iterative reconstruction, and/or weight based adjustment of the mA/kV was utilized to reduce the radiation dose to as low as reasonably achievable. COMPARISON: CT of the chest dated 01/29/2024. CLINICAL HISTORY: Sepsis. FINDINGS: CHEST: MEDIASTINUM AND LYMPH NODES: Heart and pericardium are unremarkable. The central airways are clear. No mediastinal, hilar or axillary lymphadenopathy. An ECMO catheter is present within the right internal jugular vein extending to the inferior vena cava. A tracheostomy tube is present in satisfactory position. LUNGS AND PLEURA: There is extensive patchy peribronchovascular opacification/consolidation within the lungs bilaterally. There are mild bilateral pleural effusions present. There is a mild pneumothorax present on the right. There is a right chest tube present with its tip in the apex of the pleural cavity. ABDOMEN AND PELVIS: LIVER: The liver is unremarkable. GALLBLADDER AND BILE DUCTS: Status post cholecystectomy. No biliary ductal dilatation. SPLEEN: No acute abnormality. PANCREAS: No acute abnormality. ADRENAL GLANDS: No acute abnormality. KIDNEYS, URETERS AND BLADDER: No stones in the kidneys or ureters. No hydronephrosis. No perinephric or periureteral stranding. A Foley catheter is within the urinary bladder. GI AND BOWEL: An enteric catheter is present with its tip in the distal stomach. There is no bowel obstruction. A rectal drainage device is also present. REPRODUCTIVE ORGANS: No acute abnormality. PERITONEUM AND RETROPERITONEUM: No ascites. No free air. VASCULATURE: Aorta is normal in caliber. ABDOMINAL AND PELVIS LYMPH NODES: No lymphadenopathy. BONES AND SOFT TISSUES: The patient is status post right shoulder arthroplasty. Postoperative changes are noted within the lumbar spine. No focal soft tissue abnormality.  IMPRESSION: 1. Extensive bilateral patchy peribronchovascular opacification/consolidation. 2. Mild bilateral pleural effusions. 3. Mild right pneumothorax with right chest tube in place. 4. Support devices: ECMO catheter via right internal jugular vein extending to the inferior vena cava; enteric tube with tip in the distal stomach; tracheostomy tube in satisfactory position; Foley catheter within the urinary bladder; rectal drainage device in place. 5. Postsurgical changes: right shoulder arthroplasty, cholecystectomy, and postoperative changes in the lumbar spine. Electronically signed by: Evalene Coho MD 05/29/2024 10:37 AM EST RP Workstation: [id number]   CT HEAD WO CONTRAST ( ) Result Date: 05/29/2024 EXAM: CT HEAD WITHOUT CONTRAST 05/29/2024 10:22:11 AM TECHNIQUE: CT of the head was performed without the administration of intravenous  contrast. Automated exposure control, iterative reconstruction, and/or weight based adjustment of the mA/kV was utilized to reduce the radiation dose to as low as reasonably achievable. COMPARISON: CT of the head dated 08/03/2022. CLINICAL HISTORY: Neuro deficit, acute, stroke suspected. FINDINGS: BRAIN AND VENTRICLES: No acute hemorrhage. No evidence of acute infarct. No hydrocephalus. No extra-axial collection. No mass effect or midline shift. There are chronic encephalomalacia changes again demonstrated within the right occipital lobe. There is mild cerebral volume loss present. ORBITS: No acute abnormality. SINUSES: There is mucosal disease within the paranasal sinuses, with near complete opacification of the right sphenoid sinus. A nasal catheter is present on the left. SOFT TISSUES AND SKULL: No acute soft tissue abnormality. No skull fracture. IMPRESSION: 1. No acute intracranial abnormality. 2. Chronic encephalomalacia changes in the right occipital lobe. 3. Mucosal disease within the paranasal sinuses, with near complete opacification of the right sphenoid sinus.  Electronically signed by: Evalene Coho MD 05/29/2024 10:32 AM EST RP Workstation: [id number]   DG CHEST PORT 1 VIEW Result Date: 05/29/2024 EXAM: 1 VIEW(S) XRAY OF THE CHEST 05/29/2024 05:28:00 AM COMPARISON: 05/28/2024 CLINICAL HISTORY: Acute hypoxic respiratory failure (HCC) [id number] FINDINGS: LINES, TUBES AND DEVICES: Tracheostomy tube in place with tip 5.1 cm above the carina. Enteric tube in place coarsening below the level of the GE junction. Stable right chest tube with tip near the right lung apex. Stable left PICC. Stable ECMO catheter. LUNGS AND PLEURA: Surgical suture overlying right mid lung. Right sided hydropneumothorax is again noted with progressive opacification within the right upper hemithorax compatible with increasing fluid content. Unchanged patchy right mid to lower lung opacities. Unchanged patchy left upper lung opacities. Unchanged left pleural effusion. No pulmonary edema. HEART AND MEDIASTINUM: No acute abnormality of the cardiac and mediastinal silhouettes. BONES AND SOFT TISSUES: Right shoulder arthroplasty noted. No acute osseous abnormality. IMPRESSION: 1. Right hydropneumothorax with increased right upper hemithorax opacification consistent with increasing fluid content. 2. Unchanged left pleural effusion and bilateral pulmonary parenchymal airspace opacities. Electronically signed by: Waddell Calk MD 05/29/2024 06:46 AM EST RP Workstation: [id number]   DG CHEST PORT 1 VIEW Result Date: 05/28/2024 CLINICAL DATA:  Acute hypoxic respiratory failure. EXAM: PORTABLE CHEST 1 VIEW COMPARISON:  05/27/2024 FINDINGS: Evidence for right jugular dual lumen ECMO cannula. Extensive parenchymal disease at the left lung is unchanged. Patient has a tracheostomy tube. Left arm PICC line with the tip near the superior cavoatrial junction. Persistent opacities at both lung bases. Again noted is lucency throughout the right upper chest compatible with pleural air. Stable position of the chest  tube in the right upper chest. Probable consolidated lung at the right lung base but limited evaluation. Feeding tube extends into the abdomen. IMPRESSION: 1. Persistent lucency in the right hemithorax is suggestive for a complex pneumothorax. Chest tube is stable in position. 2. No significant change in the left parenchymal lung disease. 3. Support apparatuses as described. Electronically Signed   By: Juliene Balder M.D.   On: 05/28/2024 16:25     Assessment/Plan: C.glabrata Fungemia= continue on micafungin; bacteremia has cleared. Anticipate longer course of treatment  VAP = finish course of meropenem tomorrow to complete course  RUL lobectomy c/b hydropneumothorax= continues with chest tube to water  seal.  Therapeutic drug monitoring = transaminitis improved.   evaluation of this patient requires complex antimicrobial therapy evaluation and counseling and isolation needs for disease transmission risk assessment and mitigation.    [facility] Pager: [phone]  05/29/2024, 4:55 PM

## 2024-05-29 NOTE — Consult Note (Signed)
 WOC Nurse Consult Note:  WOC consult performed remotely utilizing imaging and chart review   Reason for Consult:DTI to heel, clavicle/collarbone and right upper back  Wound type: Deep tissue pressure injury to L heel Deep tissue pressure injury to R clavicle- suspect device related Deep tissue pressure injury to right upper back, device related  Pressure Injury POA: No Measurement:  see nursing flow sheets Wound bed:  Deep purple maroon discoloration with intact skin Drainage (amount, consistency, odor) none Periwound: intact Dressing procedure/placement/frequency:   L heel/ clavicle/ upper back: cleanse with NS, pat dry.  Apply Xeroform over wound bed and cover with silicone foam dressing.  Change daily.         WOC Nurse team will follow with you and see patient within 10 days for wound assessments.  Please notify WOC nurses of any acute changes in the wounds or any new areas of concern   Thank you,  Doyal Polite, MSN, RN, [facility] WOC Team [phone] (Available Mon-Fri 0700-1500)

## 2024-05-29 NOTE — Progress Notes (Addendum)
 NAME:  Keith Gibbs, MRN:  [id number], DOB:  [birth year], LOS: 20 ADMISSION DATE:  05/09/2024, CONSULTATION DATE: 05/14/2024 REFERRING MD: CTS, CHIEF COMPLAINT: Acute on chronic hypoxic respiratory failure  History of Present Illness:  63 yo male presented for lobectomy with CTS. Progressing as expected post operatively with CT in place and was on 4E when this morning he decompensated with worsening hypoxemia and increased wob. CTS transferred to ICU and requested CCM evaluation.   Patient went into ARDS and eventually cannulated for VV ECMO.  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  He was difficult to oxygenate and ventilate therefore APRV started.  Patient seems to be tolerating the APRV. 10/29 -nitric oxide is weaned off overnight.  It has not helped.  It did not change his respiratory status.  Patient continues to have ARDS with bilateral pulmonary infiltrates.  At this stage we decided to proning.  Chest tube is hooked up to suction. 10/29 -patient's oxygenation improved with proning but ventilation got worse.  At this stage given the ARDS a collective decision is made to cannulate the patient for VV ECMO.  Family was in agreement. 10/30 -blood cultures from 05/19/2024 and BAL cultures from 05/19/2024 grew Candida glabrata 10/31 - 11/2 unable to get the patient off paralytics  11/3 OR for tracheostomy, weaned off nimbex, TEE with repositioning of crescent cannula 11/4 weaned off ketamine , circuit exchanged at beside for failing oxygenator, increasing vasopressor requirements and worsening  bradycardia  Interim History / Subjective:  Weaned off ketamine  yesterday. Continued to wean versed  overnight. Had NSVT overnight and worsening bradycardia. Increasing vasopressor requirements but also very labile blood pressure with intermittent hypertension. 1 PRBC overnight for Hgb 7.2.   Objective    Blood pressure 121/66, pulse (!) 39, temperature 98.6 F (37 C), resp. rate 15, height 6' 3 (1.905 m), weight 106 kg, SpO2 93%. CVP:  [1 mmHg-31 mmHg] 12 mmHg  Vent Mode: PCV FiO2 (%):  [60 %-80 %] 80 % Set Rate:  [15 bmp] 15 bmp PEEP:  [10 cmH20] 10 cmH20 Plateau Pressure:  [19 cmH20] 19 cmH20   Intake/Output Summary (Last 24 hours) at 05/29/2024 0853 Last data filed at 05/29/2024 9192 Gross per 24 hour  Intake 4985.5 ml  Output 2325 ml  Net 2660.5 ml   Filed Weights   05/26/24 0630 05/27/24 0710 05/28/24 0500  Weight: 110.3 kg 108.5 kg 106 kg    Examination: General: sedated, critically ill appearing male  HEENT: s/p tracheostomy, no bleeding at stoma, pupils 1 mm and sluggish  Lungs: on mechanical ventilation, absent breath sounds  Cardiovascular: bradycardic, normal S1 and S2 no m/r/g  Abdomen: soft, non-distended Extremities: warm, dry, trace dependent edema  Neuro: sedated on propofol , ketamine  and versed , non-responsive, pupils pinpoint and sluggish   Ventilator PC 10/10 RR 15 FiO2 0.80 with Vt 60's   VV ECMO Flow: 4.57 L/min at 3485 rpm Delta P: 26 Sweep: 9 L/min   Resolved problem list   Assessment and Plan   Acute hypoxic and hypercapnic respiratory failure due to ARDS in the setting of candida glabrata and pseudomonas pneumonia s/p VV ECMO s/p tracheostomy 11/3 RUL adenocarcinoma  s/p right upper lobectomy History of COPD On the night of 10/21 POD4 patient developed respiratory distress and ultimately required intubation 10/26. On 10/29 patient was cannulated for VV ECMO via R internal jugular crescent cannula. Tracheostomy placed 11/3. Circuit exchanged  11/5 due to failing oxygenator. CXR this morning with increasing fluid in expected RUL pocket.  - Continue to monitor daily CXR, intermittent ABG  - Continue rest settings on ventilator  - Adjust sweep as needed to maintain PaCO2 35-45  - Goal SpO2>80% and PaO2>45, PaO2<45 check lactate  - R chest tube is to waterseal without air leak, continue to monitor output  - Continue nitric oxide via circuit to prevent clotting  - PRN Duonebs  - Resume bivalrudin this morning with goal PTT 60, hold on further titration pending CTH   Acute encephalopathy due to sedation History of anxiety, schizoaffective disorder bipolar type  Nimbex stopped 11/3. Ketamine  weaned off 11/4. This morning with worsening bradycardia HR 30s-40s and drastic blood pressure swings concerning for possible neurologic injury.  - Will obtain CTH  - Continue propofol , versed  and HM infusions to maintain RASS -4 to -5. Will focus on weaning propofol  and versed  given hypotension and bradycardia  - Continue PO klonopin and oxycodone   - Continue IV olanzapine  in place of home Seroquel , may need to increase olanzapine . Will check QTc - Holding home celecoxib , Cymbalta , and depakote    Sinus bradycardia Hypotension Decrease in HR 30-40s overnight per above with drastic blood pressure swings. Unclear etiology of sinus bradycardia query if this is related to coming off ketamine  and ongoing deep sedation versus possible neurologic injury.  - CTH per above - Repeat CT CAP - Continue levophed and vasopressin  as needed to maintain MAP>65   Leukocytosis, improving  Pseudomonas and candida glabrata pneumonia  Candida glabrata fungemia  BAL 10/26 with rare pseudomonas and candida glabrata. Blood cultures 10/26 with candida glabrata. Blood cultures 10/30 and 10/31 no growth to date.  - Continue to follow-up blood cultures  - ID following, appreciate recommendations  - Continue meropenem, plan for 14 day course  - Continue micafungin course  undefined given invasive lines  AKI, resolved  Hypernatremia - Monitor renal functions and urine output - Monitor electrolytes and replace accordingly - Increase free water  flushes. Will repeat BMP in afternoon  - Begin diuresis with furosemide gtt 4 mg/hr for goal negative as tolerated   Elevated transaminases, improving  Possibly due to meds or critical illness and hypoxia.  - Continue to trend CMP   RLE DVT  BLE duplex 10/27 with acute intramuscular thrombosis involving the right gastrocnemius veins.  - Continue bivalirudin  per above  Anemia in the setting of critical illness and acute blood loss S/p 1 PRBC overnight with appropriate response. No active signs of bleeding. LDH improving 360.  - Continue to monitor daily CBC   Malnutrition  - Continue advancing tube feeds to goal   GI prophylaxis: continue PPI  DVT prophylaxis: bival infusion   Labs   CBC: Recent Labs  Lab 05/26/24 0450 05/26/24 0802 05/27/24 0433 05/27/24 1203 05/27/24 1700 05/27/24 1817 05/28/24 0420 05/28/24 0739 05/28/24 1524 05/28/24 1539 05/28/24 1849 05/28/24 2351 05/28/24 2355 05/29/24 0428 05/29/24 0445  WBC 24.2*   < > 21.5*  --  21.1*  --  21.2*  --  19.2*  --   --   --  13.0*  --  13.0*  NEUTROABS 18.7*  --  16.0*  --   --   --  16.5*  --   --   --   --   --  7.9*  --  7.4  HGB 8.9*   < > 8.7*   < > 8.7*   < > 8.3*   < > 8.6*   < > 7.6* 6.5* 7.2* 7.5* 8.2*  HCT 28.8*   < > 28.1*   < > 27.8*   < > 26.6*   < > 27.5*   < > 24.2* 19.0* 23.6* 22.0* 25.6*  MCV 104.0*   < > 105.2*  --  104.9*  --  104.3*  --  105.0*  --   --   --  105.8*  --  101.6*  PLT 268   < > 209  --  209  --  217  --  228  --   --   --  214  --  215   < > = values in this interval not displayed.    Basic Metabolic Panel: Recent Labs  Lab 05/23/24 0445 05/23/24 0453 05/24/24 0401 05/24/24 0408 05/25/24 0411 05/25/24 0417 05/26/24 0450 05/26/24 0802 05/27/24 0433 05/27/24 1203 05/27/24 1700 05/27/24 1817  05/28/24 0420 05/28/24 0739 05/28/24 1524 05/28/24 1539 05/28/24 1717 05/28/24 1840 05/28/24 2351 05/29/24 0428 05/29/24 0445  NA 146*   < > 147*   < > 148*   < > 148*   < > 150*   < > 150*   < > 149*   < > 152*   < > 151* 153* 154* 154* 151*  K 5.3*   < > 5.1   < > 4.9   < > 4.7   < > 4.7   < > 4.7   < > 4.4   < > 4.3   < > 4.3 4.1 3.8 3.7 3.7  CL 106   < > 107   < > 111   < > 112*   < > 113*  --  113*  --  113*  --  118*  --   --   --   --   --  117*  CO2 29   < > 29   < > 29   < > 28   < > 29  --  26  --  26  --  26  --   --   --   --   --  25  GLUCOSE 113*   < > 109*   < > 124*   < > 115*   < > 108*  --  84  --  134*  --  136*  --   --   --   --   --  145*  BUN 46*   < > 50*   < > 48*   < > 42*   < > 38*  --  38*  --  47*  --  51*  --   --   --   --   --  42*  CREATININE 0.88   < > 0.84   < > 0.85   < > 0.71   < > 0.65  --  0.78  --  0.81  --  0.88  --   --   --   --   --  0.70  CALCIUM  7.5*   < > 8.2*   < > 8.2*   < > 8.1*   < > 8.3*  --  8.3*  --  8.0*  --  8.1*  --   --   --   --   --  7.9*  MG 3.1*  --  3.0*  --  2.5*  --  2.3  --   --   --   --   --   --   --   --   --   --   --   --   --  2.2  PHOS 3.1  --  3.7  --   --   --   --   --   --   --   --   --   --   --   --   --   --   --   --   --  4.3   < > = values in this interval not displayed.   GFR: Estimated Creatinine Clearance: 124.5 mL/min (by C-G formula based on SCr of 0.7 mg/dL). Recent Labs  Lab 05/27/24 2133 05/28/24 0420 05/28/24 0855 05/28/24 1522 05/28/24 1524 05/28/24 2355 05/29/24 0445 05/29/24 0656  WBC  --  21.2*  --   --  19.2* 13.0* 13.0*  --   LATICACIDVEN 0.7  --  0.5 0.6  --   --   --  0.5    Liver Function Tests: Recent Labs  Lab 05/25/24 0411 05/26/24 0450 05/27/24 0433 05/28/24 0420 05/29/24 0445  AST 57* 117* 89* 78* 48*  ALT 59* 84* 102* 85* 60*  ALKPHOS 92 111 146* 180* 166*  BILITOT 0.5 0.8 1.3* 2.8* 1.7*  PROT 5.2* 5.1* 5.1* 5.0* 5.0*  ALBUMIN  2.1* 1.9* 1.8* 1.8* 1.7*     No results for input(s): LIPASE, AMYLASE in the last 168 hours. No results for input(s): AMMONIA in the last 168 hours.  ABG    Component Value Date/Time   PHART 7.407 05/29/2024 0428   PCO2ART 40.5 05/29/2024 0428   PO2ART 56 (L) 05/29/2024 0428   HCO3 25.5 05/29/2024 0428   TCO2 27 05/29/2024 0428   ACIDBASEDEF 2.0 05/28/2024 1541   O2SAT 89 05/29/2024 0428     Coagulation Profile: Recent Labs  Lab 05/25/24 0411 05/26/24 0450 05/27/24 0433 05/28/24 0420 05/29/24 0445  INR 1.7* 1.8* 1.3* 1.3* 1.7*    Cardiac Enzymes: No results for input(s): CKTOTAL, CKMB, CKMBINDEX, TROPONINI in the last 168 hours.  HbA1C: Hgb A1c MFr Bld  Date/Time Value Ref Range Status  02/12/2024 08:40 AM 5.3 4.8 - 5.6 % Final    Comment:             Prediabetes: 5.7 - 6.4          Diabetes: >6.4          Glycemic control for adults with diabetes: <7.0   04/26/2022 03:30 PM 5.2 4.8 - 5.6 % Final    Comment:    (NOTE) Pre diabetes:          5.7%-6.4%  Diabetes:              >6.4%  Glycemic control for   <7.0% adults with diabetes     CBG: Recent Labs  Lab 05/28/24 2024 05/28/24 2312 05/28/24 2349 05/29/24 0419 05/29/24 0753  GLUCAP 134* 132* 153* 134* 132*       The patient is critically ill with multiple organ system failure and requires high complexity decision making for assessment and support, frequent evaluation and titration of therapies, advanced monitoring, review of radiographic studies and interpretation of complex data.   Critical Care Time devoted to patient care services, exclusive of separately billable procedures, described in this note is 40 minutes.  Rexene LOISE Blush, PA-C [facility] 05/29/24 8:53 AM  Please see [email or web address] for pager details.  From 7A-7P if no response, please call [phone] After hours, please call ELink [phone]

## 2024-05-30 ENCOUNTER — Inpatient Hospital Stay (HOSPITAL_COMMUNITY): Payer: MEDICAID

## 2024-05-30 DIAGNOSIS — I10 Essential (primary) hypertension: Secondary | ICD-10-CM | POA: Diagnosis not present

## 2024-05-30 DIAGNOSIS — J9601 Acute respiratory failure with hypoxia: Secondary | ICD-10-CM | POA: Diagnosis not present

## 2024-05-30 DIAGNOSIS — J80 Acute respiratory distress syndrome: Secondary | ICD-10-CM | POA: Diagnosis not present

## 2024-05-30 DIAGNOSIS — Z902 Acquired absence of lung [part of]: Secondary | ICD-10-CM | POA: Diagnosis not present

## 2024-05-30 DIAGNOSIS — J9602 Acute respiratory failure with hypercapnia: Secondary | ICD-10-CM | POA: Diagnosis not present

## 2024-05-30 DIAGNOSIS — B371 Pulmonary candidiasis: Secondary | ICD-10-CM | POA: Diagnosis not present

## 2024-05-30 DIAGNOSIS — U071 COVID-19: Secondary | ICD-10-CM

## 2024-05-30 DIAGNOSIS — J939 Pneumothorax, unspecified: Secondary | ICD-10-CM | POA: Diagnosis not present

## 2024-05-30 LAB — CBC
HCT: 23.9 % — ABNORMAL LOW (ref 39.0–52.0)
Hemoglobin: 7.5 g/dL — ABNORMAL LOW (ref 13.0–17.0)
MCH: 32.1 pg (ref 26.0–34.0)
MCHC: 31.4 g/dL (ref 30.0–36.0)
MCV: 102.1 fL — ABNORMAL HIGH (ref 80.0–100.0)
Platelets: 171 K/uL (ref 150–400)
RBC: 2.34 MIL/uL — ABNORMAL LOW (ref 4.22–5.81)
RDW: 16.9 % — ABNORMAL HIGH (ref 11.5–15.5)
WBC: 11 K/uL — ABNORMAL HIGH (ref 4.0–10.5)
nRBC: 1.1 % — ABNORMAL HIGH (ref 0.0–0.2)

## 2024-05-30 LAB — HEPATIC FUNCTION PANEL
ALT: 46 U/L — ABNORMAL HIGH (ref 0–44)
AST: 35 U/L (ref 15–41)
Albumin: 2 g/dL — ABNORMAL LOW (ref 3.5–5.0)
Alkaline Phosphatase: 166 U/L — ABNORMAL HIGH (ref 38–126)
Bilirubin, Direct: 0.5 mg/dL — ABNORMAL HIGH (ref 0.0–0.2)
Indirect Bilirubin: 0.7 mg/dL (ref 0.3–0.9)
Total Bilirubin: 1.2 mg/dL (ref 0.0–1.2)
Total Protein: 5.4 g/dL — ABNORMAL LOW (ref 6.5–8.1)

## 2024-05-30 LAB — POCT I-STAT 7, (LYTES, BLD GAS, ICA,H+H)
Acid-base deficit: 2 mmol/L (ref 0.0–2.0)
Acid-base deficit: 1 mmol/L (ref 0.0–2.0)
Acid-base deficit: 5 mmol/L — ABNORMAL HIGH (ref 0.0–2.0)
Bicarbonate: 23.7 mmol/L (ref 20.0–28.0)
Bicarbonate: 23.9 mmol/L (ref 20.0–28.0)
Bicarbonate: 20.6 mmol/L (ref 20.0–28.0)
Calcium, Ion: 1.23 mmol/L (ref 1.15–1.40)
Calcium, Ion: 1.21 mmol/L (ref 1.15–1.40)
Calcium, Ion: 1.14 mmol/L — ABNORMAL LOW (ref 1.15–1.40)
HCT: 23 % — ABNORMAL LOW (ref 39.0–52.0)
HCT: 20 % — ABNORMAL LOW (ref 39.0–52.0)
HCT: 20 % — ABNORMAL LOW (ref 39.0–52.0)
Hemoglobin: 7.8 g/dL — ABNORMAL LOW (ref 13.0–17.0)
Hemoglobin: 6.8 g/dL — CL (ref 13.0–17.0)
Hemoglobin: 6.8 g/dL — CL (ref 13.0–17.0)
O2 Saturation: 78 %
O2 Saturation: 87 %
O2 Saturation: 87 %
Patient temperature: 37.2
Patient temperature: 37.2
Patient temperature: 37.1
Potassium: 4.4 mmol/L (ref 3.5–5.1)
Potassium: 4.6 mmol/L (ref 3.5–5.1)
Potassium: 3.9 mmol/L (ref 3.5–5.1)
Sodium: 146 mmol/L — ABNORMAL HIGH (ref 135–145)
Sodium: 143 mmol/L (ref 135–145)
Sodium: 146 mmol/L — ABNORMAL HIGH (ref 135–145)
TCO2: 25 mmol/L (ref 22–32)
TCO2: 25 mmol/L (ref 22–32)
TCO2: 22 mmol/L (ref 22–32)
pCO2 arterial: 45.7 mmHg (ref 32–48)
pCO2 arterial: 40.3 mmHg (ref 32–48)
pCO2 arterial: 37.3 mmHg (ref 32–48)
pH, Arterial: 7.324 — ABNORMAL LOW (ref 7.35–7.45)
pH, Arterial: 7.382 (ref 7.35–7.45)
pH, Arterial: 7.35 (ref 7.35–7.45)
pO2, Arterial: 46 mmHg — ABNORMAL LOW (ref 83–108)
pO2, Arterial: 55 mmHg — ABNORMAL LOW (ref 83–108)
pO2, Arterial: 55 mmHg — ABNORMAL LOW (ref 83–108)

## 2024-05-30 LAB — BASIC METABOLIC PANEL WITH GFR
Anion gap: 10 (ref 5–15)
Anion gap: 7 (ref 5–15)
BUN: 38 mg/dL — ABNORMAL HIGH (ref 8–23)
BUN: 37 mg/dL — ABNORMAL HIGH (ref 8–23)
CO2: 22 mmol/L (ref 22–32)
CO2: 23 mmol/L (ref 22–32)
Calcium: 8 mg/dL — ABNORMAL LOW (ref 8.9–10.3)
Calcium: 7.3 mg/dL — ABNORMAL LOW (ref 8.9–10.3)
Chloride: 111 mmol/L (ref 98–111)
Chloride: 112 mmol/L — ABNORMAL HIGH (ref 98–111)
Creatinine, Ser: 0.77 mg/dL (ref 0.61–1.24)
Creatinine, Ser: 0.57 mg/dL — ABNORMAL LOW (ref 0.61–1.24)
GFR, Estimated: >60 mL/min (ref >60)
GFR, Estimated: >60 mL/min (ref >60)
Glucose, Bld: 164 mg/dL — ABNORMAL HIGH (ref 70–99)
Glucose, Bld: 145 mg/dL — ABNORMAL HIGH (ref 70–99)
Potassium: 4.3 mmol/L (ref 3.5–5.1)
Potassium: 4.2 mmol/L (ref 3.5–5.1)
Sodium: 143 mmol/L (ref 135–145)
Sodium: 142 mmol/L (ref 135–145)

## 2024-05-30 LAB — CBC WITH DIFFERENTIAL/PLATELET
Abs Immature Granulocytes: 0.13 K/uL — ABNORMAL HIGH (ref 0.00–0.07)
Basophils Absolute: 0 K/uL (ref 0.0–0.1)
Basophils Relative: 0 %
Eosinophils Absolute: 1.5 K/uL — ABNORMAL HIGH (ref 0.0–0.5)
Eosinophils Relative: 12 %
HCT: 25.3 % — ABNORMAL LOW (ref 39.0–52.0)
Hemoglobin: 8 g/dL — ABNORMAL LOW (ref 13.0–17.0)
Immature Granulocytes: 1 %
Lymphocytes Relative: 20 %
Lymphs Abs: 2.4 K/uL (ref 0.7–4.0)
MCH: 32.7 pg (ref 26.0–34.0)
MCHC: 31.6 g/dL (ref 30.0–36.0)
MCV: 103.3 fL — ABNORMAL HIGH (ref 80.0–100.0)
Monocytes Absolute: 0.6 K/uL (ref 0.1–1.0)
Monocytes Relative: 5 %
Neutro Abs: 7.6 K/uL (ref 1.7–7.7)
Neutrophils Relative %: 62 %
Platelets: 188 K/uL (ref 150–400)
RBC: 2.45 MIL/uL — ABNORMAL LOW (ref 4.22–5.81)
RDW: 17.1 % — ABNORMAL HIGH (ref 11.5–15.5)
WBC: 12.2 K/uL — ABNORMAL HIGH (ref 4.0–10.5)
nRBC: 0.9 % — ABNORMAL HIGH (ref 0.0–0.2)

## 2024-05-30 LAB — PHOSPHORUS
Phosphorus: 4.9 mg/dL — ABNORMAL HIGH (ref 2.5–4.6)
Phosphorus: 3.9 mg/dL (ref 2.5–4.6)

## 2024-05-30 LAB — FIBRINOGEN: Fibrinogen: 686 mg/dL — ABNORMAL HIGH (ref 210–475)

## 2024-05-30 LAB — APTT
aPTT: 60 s — ABNORMAL HIGH (ref 24–36)
aPTT: 75 s — ABNORMAL HIGH (ref 24–36)
aPTT: 80 s — ABNORMAL HIGH (ref 24–36)

## 2024-05-30 LAB — GLUCOSE, CAPILLARY
Glucose-Capillary: 127 mg/dL — ABNORMAL HIGH (ref 70–99)
Glucose-Capillary: 120 mg/dL — ABNORMAL HIGH (ref 70–99)
Glucose-Capillary: 117 mg/dL — ABNORMAL HIGH (ref 70–99)
Glucose-Capillary: 104 mg/dL — ABNORMAL HIGH (ref 70–99)
Glucose-Capillary: 124 mg/dL — ABNORMAL HIGH (ref 70–99)
Glucose-Capillary: 128 mg/dL — ABNORMAL HIGH (ref 70–99)

## 2024-05-30 LAB — PROTIME-INR
INR: 1.3 — ABNORMAL HIGH (ref 0.8–1.2)
Prothrombin Time: 17.1 s — ABNORMAL HIGH (ref 11.4–15.2)

## 2024-05-30 LAB — MAGNESIUM
Magnesium: 2.2 mg/dL (ref 1.7–2.4)
Magnesium: 1.9 mg/dL (ref 1.7–2.4)

## 2024-05-30 LAB — LACTATE DEHYDROGENASE: LDH: 420 U/L — ABNORMAL HIGH (ref 98–192)

## 2024-05-30 LAB — TRIGLYCERIDES: Triglycerides: 352 mg/dL — ABNORMAL HIGH (ref <150)

## 2024-05-30 MED ORDER — OLANZAPINE 10 MG IM SOLR
5.0000 mg | Freq: Three times a day (TID) | INTRAMUSCULAR | Status: DC
  Administered 2024-05-30 – 2024-06-04 (×17): 5 mg via INTRAVENOUS
  Filled 2024-05-30 (×19): qty 10

## 2024-05-30 MED ORDER — STERILE WATER FOR INJECTION IJ SOLN
INTRAMUSCULAR | Status: AC
  Administered 2024-05-30: 10 mL
  Filled 2024-05-30: qty 10

## 2024-05-30 NOTE — Progress Notes (Signed)
 PHARMACY - ANTICOAGULATION CONSULT NOTE  Pharmacy Consult for heparin  >> bivalirudin  Indication: VTE, VV ECMO (10/29 - current)  Allergies  Allergen Reactions   Asenapine Other (See Comments) and Nausea And Vomiting    Increased tremors   Dextromethorphan Hbr    Guaifenesin     Latuda [Lurasidone Hcl] Other (See Comments)    Tremors     Lurasidone Other (See Comments) and Hives    Other Reaction(s): Angioedema   Phenylephrine      Patient Measurements: Height: 6' 3 (190.5 cm) Weight: 113.7 kg (250 lb 10.6 oz) IBW/kg (Calculated) : 84.5  Vital Signs: Temp: 98.8 F (37.1 C) (11/06 1405) Temp Source: Bladder (11/06 0700) BP: 106/53 (11/06 1400) Pulse Rate: 44 (11/06 1405)  Labs: Recent Labs    05/28/24 0420 05/28/24 0739 05/29/24 0445 05/29/24 1623 05/29/24 1629 05/30/24 0420 05/30/24 1200 05/30/24 1205 05/30/24 1550  HGB 8.3*   < > 8.2* 7.7*   < > 8.0*  7.8*  --  6.8* 7.5*  HCT 26.6*   < > 25.6* 24.1*   < > 25.3*  23.0*  --  20.0* 23.9*  PLT 217   < > 215 201  --  188  --   --  171  APTT 35   < > 59* 61*  --  60* 75*  --  80*  LABPROT 16.6*  --  20.5*  --   --  17.1*  --   --   --   INR 1.3*  --  1.7*  --   --  1.3*  --   --   --   CREATININE 0.81   < > 0.70 0.65  --  0.77  --   --  0.57*   < > = values in this interval not displayed.    Estimated Creatinine Clearance: 128.6 mL/min (A) (by C-G formula based on SCr of 0.57 mg/dL (L)).   Assessment: 32 yoM admitted for RUL lobectomy.  Pt intubated, now having difficulty with ventilation. Placed on VV ECMO 10/29 in the evening. Pharmacy to dose IV bivalirudin .   Previously on therapeutic heparin  (HL 0.33 on 1700/h) for acute DVT of gastrocnemius vein  > changed to bivalirudin  with ECMO cannulation  S/p circuit change 11/4  aPTT 59 sec for new goal of ~60 sec. No bleeding noted.  Discussed with team, will increase goal to 60-80 seconds given minimal trach bleeding today.  Bivalirudin  was increased to 0.12  mg/kg/hr, and resulting aPTT was 75.  PM update: aPTT therapeutic at 80 sec; no issue reported.  Goal of Therapy:  aPTT goal increased to 60-70 sec (11/4 post circuit change) Monitor platelets by anticoagulation protocol: Yes   Plan:  Reduce bivalirudin  slightly to 0.11 mg/kg/hr Q12h aPTT and CBC  Madigan Rosensteel D. Lendell, PharmD, BCPS, BCCCP 05/30/2024, 6:03 PM

## 2024-05-30 NOTE — CV Procedure (Signed)
 ECMO NOTE:   Indication: Respiratory failure due to ARDS   Initial cannulation date: 05/22/24   ECMO type: VV ECMO   Dual lumen inflow/return cannula:   1) 70F Cres[place] placed in RIJ    Daily data:   Flow 4.54L RPM 3485 Sweep  9L Pven -80   Labs:   ABG    Component Value Date/Time   PHART 7.324 (L) 05/30/2024 0420   PCO2ART 45.7 05/30/2024 0420   PO2ART 46 (L) 05/30/2024 0420   HCO3 23.7 05/30/2024 0420   TCO2 25 05/30/2024 0420   ACIDBASEDEF 2.0 05/30/2024 0420   O2SAT 78 05/30/2024 0420    Hgb 8.2 Platelets 215 LDH 360 PTT 59, goal of 60.   Plan:   Continue VV ECMO support Await lung recovery, expect prolonged course No significant fibrosis on chest CT Continue abx/antifungals, 14 day course of meropenem than off Continue to wean sedation, had some issues overnight waking up and dropping flows, back up on 80 of propofol  Nitric oxide through the circuit Small deposits at 3 and 9 Lasix gtt off   Morene JINNY Brownie, MD  12:18 PM

## 2024-05-30 NOTE — Progress Notes (Signed)
 ID PROGRESS NOTE  63yo M s/p lobectomy c/b ARDS, requiring  VV ECMO and c.glabrata fungemia. TEE negative for vegetation  Completed 14 days of antipseudomonal treatment. Will stop meropenem today  Continue on micafungin. End of treatment will in part be dependent on when his lines are removed. Anticipate prolonged course. Continue to monitor CMP.  Keith FURY Luiz MD MPH [facility] [phone]

## 2024-05-30 NOTE — Progress Notes (Signed)
 NAME:  Keith Gibbs, MRN:  [id number], DOB:  24-Jan-1961, LOS: 21 ADMISSION DATE:  05/09/2024, CONSULTATION DATE: 05/14/2024 REFERRING MD: CTS, CHIEF COMPLAINT: Acute on chronic hypoxic respiratory failure  History of Present Illness:  63 yo male presented for lobectomy with CTS. Progressing as expected post operatively with CT in place and was on 4E when this morning he decompensated with worsening hypoxemia and increased wob. CTS transferred to ICU and requested CCM evaluation.   Patient went into ARDS and eventually cannulated for VV ECMO.  Pertinent  Medical History  RUL nodule s/p RUL lobectomy Bipolar 1 d/o Schizophrenia Polysubstance abuse Cad Mild to mod Ao insuff Htn Hyperlipidemia Tobacco use COPD Hep C  Significant Hospital Events: Including procedures, antibiotic start and stop dates in addition to other pertinent events   Admitted 10/16 for RUL Lobectomy Transferred to ICU 10/21  10/24 started zosyn 10/26 - Intubated.  He was difficult to oxygenate and ventilate therefore APRV started.  Started meropenem, azithromycin , cefepime, & vanc 10/27 con't vanc & mero 10/29 -nitric oxide weaned off overnight-did not change his respiratory status.  Proned. 10/29 -worsened despite proning > cannulated for VV ECMO.  Started micafungin empirically, con't previous abd- vanc & mero 10/30 -blood cultures from 05/19/2024 and BAL cultures from 05/19/2024 grew Candida glabrata 10/31 - 11/2 unable to get the patient off paralytics  11/3 tracheostomy (ENT), weaned off nimbex, TEE with repositioning of crescent cannula 11/4 weaned off ketamine , circuit exchanged at beside for failing oxygenator, increasing vasopressor requirements and new bradycardia  Interim History / Subjective:  Overnight had agitation requiring increase in sedation.   Objective    Blood pressure (!) 91/54, pulse (!) 49, temperature 98.6 F (37 C), temperature source Bladder, resp. rate (!) 4, height 6' 3  (1.905 m), weight 113.7 kg, SpO2 (!) 87%. CVP:  [6 mmHg-10 mmHg] 10 mmHg  Vent Mode: PCV FiO2 (%):  [60 %-100 %] 100 % Set Rate:  [15 bmp] 15 bmp PEEP:  [10 cmH20] 10 cmH20 Pressure Support:  [10 cmH20] 10 cmH20 Plateau Pressure:  [19 cmH20] 19 cmH20   Intake/Output Summary (Last 24 hours) at 05/30/2024 0717 Last data filed at 05/30/2024 0700 Gross per 24 hour  Intake 6885.94 ml  Output 4165 ml  Net 2720.94 ml   Filed Weights   05/27/24 0710 05/28/24 0500 05/30/24 0500  Weight: 108.5 kg 106 kg 113.7 kg    Examination: General: critically ill appearing man lying in bed in NAD, heavily sedated on ECMO HEENT: trach in place, no bleeding around stoma Lungs: breathing comfortably on MV, absent breath sounds, no air leak from chest tube Cardiovascular: S1S2, bradycardic, reg rhythm. SB on tele.  Abdomen: soft, NT Extremities: mild LE edema, no cyanosis Neuro: RASS -5, reactive pupils   I/O +2.7L, net -5.7L  Ventilator PC 15/10/10/60%, about 60cc Vt  VV ECMO Flow: 3600 RMP>  4.4 L Sweep: 9 L/min   7.32/46/46/24 BUN 38 Cr 0.77 LDH 420  WBC 12.2 H/H 8/25.3 Platelet 188 PTT 60  CT chest: ongoing consolidation in both lungs without cavities, hydropneumothorax and chest tube on the R  Resolved problem list   Assessment and Plan   Acute hypoxic and hypercapnic respiratory failure due to ARDS in the setting of candida glabrata and pseudomonas pneumonia s/p VV ECMO s/p tracheostomy 11/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 History of COPD -con't VV ECMO support, con't ultralung protective ventilation -bival  & iNO through circuit for [facility] -goal flows >4L -titrate sweep for  normal pH -goal SpO2 >80% as long as lactates remain stable -routine trach care per protocol; appreciate ENT's management -bronchodilators- brovana & yupelri, avoiding ICS -PAD protocol- unfortuantely requires very low RASS. Con't clonazpem, olanzapine , dilaudid  & versed  gtt.  Acute  encephalopathy due to sedation History of anxiety, schizoaffective disorder bipolar type  - con't sedation as required to maintain VV ECMO flows; unfortunately when he wakes up he tends to not tolerate flows and requires re-sedation   - Continue PO klonopin and oxycodone   -IV olanzapine  increased to TID -con't holding home celecoxib , Cymbalta , and depakote    Sinus bradycardia> suspect this is related to loss of sympathetic effects of ketamine  Hypotension -monitor; fine if HR is staying stable in 40s with stable pressors -no precedex  - NE & vaso PRN to maintain MAP >65  Pseudomonas and candida glabrata pneumonia  Candida glabrata fungemia  -pseudomonas coverage for 14 days, then off -micafungin per ID; anticipate a long course with cannula that cannot be removed at this time -eventually needs ophtho evaluation  AKI, resolved  Hypernatremia -free water  -monitor daily electrolytes -holding AC for now due to chugging  Elevated transaminases, improving  -trend, supportive care  RLE DVT  -bival; needs minimum 3 months treatment for provoked clot  Anemia in the setting of critical illness and acute blood loss -transfuse for Hb <8 or hemodynamically significant bleeding on ECMO  Malnutrition  -con't TF at goal  Deconditioning -expect a very long rehab course  GI prophylaxis: PPI  DVT prophylaxis: bival infusion   Plan discussed with ECMO team during multidisciplinary rounds.     Labs   CBC: Recent Labs  Lab 05/27/24 0433 05/27/24 1203 05/28/24 0420 05/28/24 0739 05/28/24 1524 05/28/24 1539 05/28/24 2355 05/29/24 0428 05/29/24 0445 05/29/24 1623 05/29/24 1629 05/29/24 1953 05/30/24 0420  WBC 21.5*   < > 21.2*  --  19.2*  --  13.0*  --  13.0* 12.6*  --   --  12.2*  NEUTROABS 16.0*  --  16.5*  --   --   --  7.9*  --  7.4  --   --   --  7.6  HGB 8.7*   < > 8.3*   < > 8.6*   < > 7.2*   < > 8.2* 7.7* 7.1* 7.1* 8.0*  7.8*  HCT 28.1*   < > 26.6*   < > 27.5*   < >  23.6*   < > 25.6* 24.1* 21.0* 21.0* 25.3*  23.0*  MCV 105.2*   < > 104.3*  --  105.0*  --  105.8*  --  101.6* 101.7*  --   --  103.3*  PLT 209   < > 217  --  228  --  214  --  215 201  --   --  188   < > = values in this interval not displayed.    Basic Metabolic Panel: Recent Labs  Lab 05/24/24 0401 05/24/24 0408 05/25/24 0411 05/25/24 0417 05/26/24 0450 05/26/24 0802 05/28/24 0420 05/28/24 0739 05/28/24 1524 05/28/24 1539 05/29/24 0445 05/29/24 1451 05/29/24 1623 05/29/24 1629 05/29/24 1953 05/30/24 0420  NA 147*   < > 148*   < > 148*   < > 149*   < > 152*   < > 151* 148* 146* 149* 148* 143  146*  K 5.1   < > 4.9   < > 4.7   < > 4.4   < > 4.3   < > 3.7  --  3.9 4.0 4.4 4.3  4.4  CL 107   < > 111   < > 112*   < > 113*  --  118*  --  117*  --  114*  --   --  111  CO2 29   < > 29   < > 28   < > 26  --  26  --  25  --  24  --   --  22  GLUCOSE 109*   < > 124*   < > 115*   < > 134*  --  136*  --  145*  --  134*  --   --  164*  BUN 50*   < > 48*   < > 42*   < > 47*  --  51*  --  42*  --  37*  --   --  38*  CREATININE 0.84   < > 0.85   < > 0.71   < > 0.81  --  0.88  --  0.70  --  0.65  --   --  0.77  CALCIUM  8.2*   < > 8.2*   < > 8.1*   < > 8.0*  --  8.1*  --  7.9*  --  7.7*  --   --  8.0*  MG 3.0*  --  2.5*  --  2.3  --   --   --   --   --  2.2  --   --   --   --  2.2  PHOS 3.7  --   --   --   --   --   --   --   --   --  4.3  --   --   --   --  4.9*   < > = values in this interval not displayed.   GFR: Estimated Creatinine Clearance: 128.6 mL/min (by C-G formula based on SCr of 0.77 mg/dL). Recent Labs  Lab 05/27/24 2133 05/28/24 0420 05/28/24 0855 05/28/24 1522 05/28/24 1524 05/28/24 2355 05/29/24 0445 05/29/24 0656 05/29/24 1623 05/30/24 0420  WBC  --    < >  --   --    < > 13.0* 13.0*  --  12.6* 12.2*  LATICACIDVEN 0.7  --  0.5 0.6  --   --   --  0.5  --   --    < > = values in this interval not displayed.    Liver Function Tests: Recent Labs  Lab  05/26/24 0450 05/27/24 0433 05/28/24 0420 05/29/24 0445 05/30/24 0420  AST 117* 89* 78* 48* 35  ALT 84* 102* 85* 60* 46*  ALKPHOS 111 146* 180* 166* 166*  BILITOT 0.8 1.3* 2.8* 1.7* 1.2  PROT 5.1* 5.1* 5.0* 5.0* 5.4*  ALBUMIN  1.9* 1.8* 1.8* 1.7* 2.0*   This patient is critically ill with multiple organ system failure which requires frequent high complexity decision making, assessment, support, evaluation, and titration of therapies. This was completed through the application of advanced monitoring technologies and extensive interpretation of multiple databases. During this encounter critical care time was devoted to patient care services described in this note for 47 minutes.  Leita SHAUNNA Gaskins, DO 05/30/24 3:31 PM [facility]  For contact information, see Amion. If no response to pager, please call PCCM consult pager. After hours, 7PM- 7AM, please call Elink.

## 2024-05-30 NOTE — Progress Notes (Signed)
 PHARMACY - ANTICOAGULATION CONSULT NOTE  Pharmacy Consult for heparin  >> bivalirudin  Indication: VTE, VV ECMO (10/29 - current)  Allergies  Allergen Reactions   Asenapine Other (See Comments) and Nausea And Vomiting    Increased tremors   Dextromethorphan Hbr    Guaifenesin     Latuda [Lurasidone Hcl] Other (See Comments)    Tremors     Lurasidone Other (See Comments) and Hives    Other Reaction(s): Angioedema   Phenylephrine      Patient Measurements: Height: 6' 3 (190.5 cm) Weight: 113.7 kg (250 lb 10.6 oz) IBW/kg (Calculated) : 84.5  Vital Signs: Temp: 98.8 F (37.1 C) (11/06 1405) Temp Source: Bladder (11/06 0700) BP: 106/53 (11/06 1400) Pulse Rate: 44 (11/06 1405)  Labs: Recent Labs    05/28/24 0420 05/28/24 0739 05/29/24 0445 05/29/24 1623 05/29/24 1629 05/30/24 0420 05/30/24 1200 05/30/24 1205 05/30/24 1550  HGB 8.3*   < > 8.2* 7.7*   < > 8.0*  7.8*  --  6.8* 7.5*  HCT 26.6*   < > 25.6* 24.1*   < > 25.3*  23.0*  --  20.0* 23.9*  PLT 217   < > 215 201  --  188  --   --  171  APTT 35   < > 59* 61*  --  60* 75*  --   --   LABPROT 16.6*  --  20.5*  --   --  17.1*  --   --   --   INR 1.3*  --  1.7*  --   --  1.3*  --   --   --   CREATININE 0.81   < > 0.70 0.65  --  0.77  --   --   --    < > = values in this interval not displayed.    Estimated Creatinine Clearance: 128.6 mL/min (by C-G formula based on SCr of 0.77 mg/dL).   Medical History: Past Medical History:  Diagnosis Date   Anginal pain    Anxiety    Arthritis    Bipolar 1 disorder (HCC)    Bursitis    CAD (coronary artery disease)    Stent placed 2017   Cancer (HCC)    Chronic pain    COPD (chronic obstructive pulmonary disease) (HCC)    Current use of long term anticoagulation    DAPT (ASA + clopidogrel )   Depression    Diverticulitis    Dyspnea    GERD (gastroesophageal reflux disease)    Grade I diastolic dysfunction    Hepatitis C 2012   No longer has Hep C   HLD  (hyperlipidemia)    Hypertension    MI (myocardial infarction) (HCC)    Polysubstance abuse (HCC)    cocaine, marijuana, ETOH   PUD (peptic ulcer disease)    S/P angioplasty with stent 06/10/2016   a.) 90% stenosis of pLAD to mLAD - 2.5 x 18 mm Xience Alpine (DES x 1) placed to pLAD   S/P PTCA (percutaneous transluminal coronary angioplasty) 12/04/2019   a.) 60% in stent restenosis of DES to pLAD; LVEF 65%.   Schizophrenia (HCC)    Stroke (HCC)    Tremors    generalized   Valvular insufficiency    a.) Mild MR, TR, PR; mild to moderate AR on 03/05/2018 TTE      Assessment: 63 yoM admitted for RUL lobectomy.  Pt intubated, now having difficulty with ventilation. Placed on VV ECMO 10/29 in the evening. Pharmacy to dose IV  bivalirudin .   Previously on therapeutic heparin  (HL 0.33 on 1700/h) for acute DVT of gastrocnemius vein  > changed to bivalirudin  with ECMO cannulation  S/p circuit change 11/4  aPTT 59 sec for new goal of ~60 sec. No bleeding noted.  Discussed with team, will increase goal to 60-80 seconds given minimal trach bleeding today.  Bivalirudin  was increased to 0.12 mg/kg/hr, and resulting aPTT was 75.  Goal of Therapy:  aPTT goal increased to 60-70 sec (11/4 post circuit change) Monitor platelets by anticoagulation protocol: Yes   Plan:  Continue bivalirudin  at 0.12 mg/kg/hr, repeat aPTT at 5 pM.  Check aPTT with next labs at 0500. Q12h aPTT and CBC  Harlene Barlow, [employer], [facility] Clinical Pharmacist  05/30/2024 4:27 PM   [facility] pharmacy phone numbers are listed on [email or web address]

## 2024-05-30 NOTE — Progress Notes (Addendum)
 Nutrition Brief Note  Checked in on patient this morning. No family at bedside. Remains on VV ECMO since 10/29. Agitated overnight and required increased sedation. Continues with edema, on exam. Continues on low dose pressors.   Tube feeds running at goal rate with ongoing electrolyte monitoring. Noted with stool output via FMS thus far today. Scheduled free water  flushes now in place at q2h for 2400 ml total per day.  Sodium down trending and within range. BUN elevated but down trending with normal creatinine. PHOS/Mg draw with morning showing marginally elevated PHOS and magnesium  WNL.   Good diuresis with 3.1L x24 hours. IV Lasix stopped 11/05.   INTERVENTION:    Continue tube feeding via Cortrak:  Vital 1.5 at 65 ml/hr Pro-Source TF20 60 mL QID TF regimen provides 185 g of protein, 2582 kcals, 1100 mL of free water    Continue current free water  flushes of 200 mL q2 hours provides 2400 mL of free water  in 24 hours   Continue Banatrol TF 60 mL BID via tube as stool bulking agent- each packet provides 5g soluble fiber      NUTRITION DIAGNOSIS:    Moderate Malnutrition related to chronic illness as evidenced by moderate muscle depletion, moderate fat depletion.   Continues but being addressed via TF    GOAL:    Patient will meet greater than or equal to 90% of their needs - meeting via TF at goal rate  Blair Deaner MS, RD, LDN Registered Dietitian Clinical Nutrition RD Inpatient Contact Info in Amion

## 2024-05-30 NOTE — Progress Notes (Signed)
 TCTS PM Rounding Progress Note  Flowing 4.2L on VV ECMO. Continues to make good urine  Vitals:   05/30/24 1830 05/30/24 1845  BP: (!) 98/55 (!) 104/57  Pulse: (!) 46 (!) 46  Resp: 11 (!) 8  Temp: 98.8 F (37.1 C) 98.6 F (37 C)  SpO2: (!) 87% (!) 87%    Plan: - Continue current care  Con Clunes, MD Cardiothoracic Surgery Pager: [phone]

## 2024-05-30 NOTE — Progress Notes (Signed)
 Advanced Heart Failure Rounding Note   Subjective:     Placed on VV ECMO day 05/22/2024  Overnight patient woke up and had issues with low flows and hypoxia. This improved with increased sedation as well as holding diuretics and giving albumin .   No significant change in hemodynamics, remains on low dose NE and vasopressin .    Objective:   Weight Range:  Vital Signs:   Temp:  [98.4 F (36.9 C)-99 F (37.2 C)] 98.6 F (37 C) (11/06 0945) Pulse Rate:  [42-56] 44 (11/06 1110) Resp:  [0-23] 12 (11/06 0945) BP: (83-129)/(39-68) 108/46 (11/06 1110) SpO2:  [61 %-100 %] 89 % (11/06 1110) Arterial Line BP: (87-156)/(38-66) 113/48 (11/06 0945) FiO2 (%):  [60 %-100 %] 100 % (11/06 1110) Weight:  [113.7 kg] 113.7 kg (11/06 0500) Last BM Date : 05/30/24  Weight change: Filed Weights   05/27/24 0710 05/28/24 0500 05/30/24 0500  Weight: 108.5 kg 106 kg 113.7 kg    Intake/Output:   Intake/Output Summary (Last 24 hours) at 05/30/2024 1223 Last data filed at 05/30/2024 1200 Gross per 24 hour  Intake 6430.93 ml  Output 3955 ml  Net 2475.93 ml     Physical Exam: General:  intubated/sedated HEENT: + ETT Neck: RIJ cannula, no bleeding, good color change CV: bradycardia, warm and well perfused extremities  Lungs: diminished, extremely limited chest wall movement Abdomen: soft, nontender, nondistended Neuro:sedated    Labs: Basic Metabolic Panel: Recent Labs  Lab 05/24/24 0401 05/24/24 0408 05/25/24 0411 05/25/24 0417 05/26/24 0450 05/26/24 0802 05/28/24 0420 05/28/24 0739 05/28/24 1524 05/28/24 1539 05/29/24 0445 05/29/24 1451 05/29/24 1623 05/29/24 1629 05/29/24 1953 05/30/24 0420  NA 147*   < > 148*   < > 148*   < > 149*   < > 152*   < > 151* 148* 146* 149* 148* 143  146*  K 5.1   < > 4.9   < > 4.7   < > 4.4   < > 4.3   < > 3.7  --  3.9 4.0 4.4 4.3  4.4  CL 107   < > 111   < > 112*   < > 113*  --  118*  --  117*  --  114*  --   --  111  CO2 29   < > 29    < > 28   < > 26  --  26  --  25  --  24  --   --  22  GLUCOSE 109*   < > 124*   < > 115*   < > 134*  --  136*  --  145*  --  134*  --   --  164*  BUN 50*   < > 48*   < > 42*   < > 47*  --  51*  --  42*  --  37*  --   --  38*  CREATININE 0.84   < > 0.85   < > 0.71   < > 0.81  --  0.88  --  0.70  --  0.65  --   --  0.77  CALCIUM  8.2*   < > 8.2*   < > 8.1*   < > 8.0*  --  8.1*  --  7.9*  --  7.7*  --   --  8.0*  MG 3.0*  --  2.5*  --  2.3  --   --   --   --   --  2.2  --   --   --   --  2.2  PHOS 3.7  --   --   --   --   --   --   --   --   --  4.3  --   --   --   --  4.9*   < > = values in this interval not displayed.    Liver Function Tests: Recent Labs  Lab 05/26/24 0450 05/27/24 0433 05/28/24 0420 05/29/24 0445 05/30/24 0420  AST 117* 89* 78* 48* 35  ALT 84* 102* 85* 60* 46*  ALKPHOS 111 146* 180* 166* 166*  BILITOT 0.8 1.3* 2.8* 1.7* 1.2  PROT 5.1* 5.1* 5.0* 5.0* 5.4*  ALBUMIN  1.9* 1.8* 1.8* 1.7* 2.0*   No results for input(s): LIPASE, AMYLASE in the last 168 hours. No results for input(s): AMMONIA in the last 168 hours.  CBC: Recent Labs  Lab 05/27/24 0433 05/27/24 1203 05/28/24 0420 05/28/24 0739 05/28/24 1524 05/28/24 1539 05/28/24 2355 05/29/24 0428 05/29/24 0445 05/29/24 1623 05/29/24 1629 05/29/24 1953 05/30/24 0420  WBC 21.5*   < > 21.2*  --  19.2*  --  13.0*  --  13.0* 12.6*  --   --  12.2*  NEUTROABS 16.0*  --  16.5*  --   --   --  7.9*  --  7.4  --   --   --  7.6  HGB 8.7*   < > 8.3*   < > 8.6*   < > 7.2*   < > 8.2* 7.7* 7.1* 7.1* 8.0*  7.8*  HCT 28.1*   < > 26.6*   < > 27.5*   < > 23.6*   < > 25.6* 24.1* 21.0* 21.0* 25.3*  23.0*  MCV 105.2*   < > 104.3*  --  105.0*  --  105.8*  --  101.6* 101.7*  --   --  103.3*  PLT 209   < > 217  --  228  --  214  --  215 201  --   --  188   < > = values in this interval not displayed.    Cardiac Enzymes: No results for input(s): CKTOTAL, CKMB, CKMBINDEX, TROPONINI in the last 168  hours.  BNP: BNP (last 3 results) Recent Labs    03/20/24 1419  BNP 20.6    ProBNP (last 3 results) No results for input(s): PROBNP in the last 8760 hours.      Medications:     Scheduled Medications:  arformoterol  15 mcg Nebulization BID   artificial tears  1 Application Both Eyes Q8H   Chlorhexidine  Gluconate Cloth  6 each Topical Daily   clonazePAM  1 mg Oral Q8H   feeding supplement (PROSource TF20)  60 mL Per Tube QID   fiber supplement (BANATROL TF)  60 mL Per Tube BID   free water   200 mL Per Tube Q2H   gabapentin   600 mg Oral Daily   And   gabapentin   1,200 mg Oral QHS   insulin aspart  0-15 Units Subcutaneous Q4H   mupirocin  ointment  1 Application Nasal BID   OLANZapine   5 mg Intravenous Q8H   mouth rinse  15 mL Mouth Rinse Q2H   oxyCODONE   10 mg Oral QID   pantoprazole  (PROTONIX ) IV  40 mg Intravenous QHS   polyethylene glycol  17 g Oral Daily   revefenacin  175 mcg Nebulization Daily   senna-docusate  2 tablet Oral  QHS   sodium chloride  flush  10-40 mL Intracatheter Q12H   sodium chloride  flush  10-40 mL Intracatheter Q12H    Infusions:  albumin  human Stopped (05/29/24 2308)   bivalirudin  (ANGIOMAX ) 250 mg in sodium chloride  0.9 % 500 mL (0.5 mg/mL) infusion 0.12 mg/kg/hr (05/30/24 0913)   feeding supplement (VITAL 1.5 CAL) 65 mL/hr at 05/30/24 0900   furosemide (LASIX) 200 mg in dextrose  5 % 100 mL (2 mg/mL) infusion Stopped (05/29/24 2249)   HYDROmorphone  5 mg/hr (05/30/24 1007)   meropenem (MERREM) IV 1 g (05/30/24 0528)   micafungin (MYCAMINE) 200 mg in sodium chloride  0.9 % 100 mL IVPB Stopped (05/29/24 1810)   midazolam  8 mg/hr (05/30/24 0900)   niCARDipine Stopped (05/24/24 2221)   norepinephrine (LEVOPHED) Adult infusion 7 mcg/min (05/30/24 0900)   propofol  (DIPRIVAN ) infusion 50 mcg/kg/min (05/30/24 1137)   vasopressin  0.04 Units/min (05/30/24 0900)    PRN Medications: albumin  human, hydrALAZINE , HYDROmorphone , hydrOXYzine ,  ipratropium-albuterol , lactulose, midazolam , ondansetron  (ZOFRAN ) IV, mouth rinse, mouth rinse, sodium chloride  flush, sodium chloride  flush   Assessment/Plan:   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 - s/p VV ECMO cannulation 05/22/24 - VV ECMO flows look good, significant fibrin buildup on previous oxygenator - S/p circuit change on 11/4, improvement in post gas from 151 to 540  - Goal PTT of 70, discussed goals with pharmacy - Nitric oxide through the circuit for anticoagulation and long pump run - Continue to wean sedation, suspect he will need a long time to have the versed  wear off - TEE with cannula adjustment 11/3 - TV remain extremely low, suspect prolonged recovery - CCM managing vent and R chest tube - Trach 11/3, appreciate ENT - continue meropenem for 14 days, micafungin for the foreseeable future. Sensititives for candida pending - Transfuse hgb < 7.5 - Start IV lasix gtt at 4 given worsening volume status  Bradycardia/hypotension: - Intermittent, continued sinus bradycardia - Continue low dose   RUL lung CA s/p lobectomy COPD with ongoing tobacco use Bipolar d/o CAD Candida glabrata fungemia/PNA - ID following - on micafungin for fungemia, ID following, course to be determined given indwelling lines - steroids off - Continue meropenem for 14 day course  HTN - ensure adequate sedation under paralytics - add cardene as needed  Remains critically ill with ARDS/fungal PNA/PTX on top of severe underlying lung disease. Long run expected  CRITICAL CARE Performed by: Morene JINNY Brownie   Total critical care time: 35 minutes  Critical care time was exclusive of separately billable procedures and treating other patients.  Critical care was necessary to treat or prevent imminent or life-threatening deterioration.  Critical care was time spent personally by me on the following activities: development of treatment plan with  patient and/or surrogate as well as nursing, discussions with consultants, evaluation of patient's response to treatment, examination of patient, obtaining history from patient or surrogate, ordering and performing treatments and interventions, ordering and review of laboratory studies, ordering and review of radiographic studies, pulse oximetry and re-evaluation of patient's condition.

## 2024-05-31 ENCOUNTER — Inpatient Hospital Stay (HOSPITAL_COMMUNITY): Payer: MEDICAID

## 2024-05-31 DIAGNOSIS — J80 Acute respiratory distress syndrome: Secondary | ICD-10-CM | POA: Diagnosis not present

## 2024-05-31 DIAGNOSIS — B371 Pulmonary candidiasis: Secondary | ICD-10-CM | POA: Diagnosis not present

## 2024-05-31 DIAGNOSIS — Z902 Acquired absence of lung [part of]: Secondary | ICD-10-CM | POA: Diagnosis not present

## 2024-05-31 DIAGNOSIS — J9621 Acute and chronic respiratory failure with hypoxia: Secondary | ICD-10-CM | POA: Diagnosis not present

## 2024-05-31 DIAGNOSIS — J9622 Acute and chronic respiratory failure with hypercapnia: Secondary | ICD-10-CM | POA: Diagnosis not present

## 2024-05-31 DIAGNOSIS — J939 Pneumothorax, unspecified: Secondary | ICD-10-CM | POA: Diagnosis not present

## 2024-05-31 LAB — BPAM RBC
Blood Product Expiration Date: 202512032359
Blood Product Expiration Date: 202512032359
Blood Product Expiration Date: 202511272359
Blood Product Expiration Date: 202511272359
Blood Product Expiration Date: 202511272359
Blood Product Expiration Date: 202511272359
Blood Product Unit Number: 202512022359
Blood Product Unit Number: 202512022359
ISSUE DATE / TIME: 202511050919
ISSUE DATE / TIME: 202511041606
ISSUE DATE / TIME: 202511050252
ISSUE DATE / TIME: 202511051027
ISSUE DATE / TIME: 202511051331
ISSUE DATE / TIME: 202511061711
ISSUE DATE / TIME: 202512032359
PRODUCT CODE: 202511050919
PRODUCT CODE: 202512022359
PRODUCT CODE: 202512022359
Unit Type and Rh: 5100
Unit Type and Rh: 5100
Unit Type and Rh: 202511050919
Unit Type and Rh: 202512022359
Unit Type and Rh: 202512022359
Unit Type and Rh: 5100
Unit Type and Rh: 5100
Unit Type and Rh: 5100
Unit Type and Rh: 5100
Unit Type and Rh: 5100
Unit Type and Rh: 5100
Unit Type and Rh: 5100
Unit Type and Rh: 5100

## 2024-05-31 LAB — TYPE AND SCREEN
ABO/RH(D): O POS
Unit division: 0
Unit division: 0
Unit division: 0
Unit division: 0
Unit division: 0
Unit division: 0
Unit division: 0
Unit division: 0
Unit division: 0

## 2024-05-31 LAB — POCT I-STAT 7, (LYTES, BLD GAS, ICA,H+H)
Acid-Base Excess: 0 mmol/L (ref 0.0–2.0)
Acid-Base Excess: 2 mmol/L (ref 0.0–2.0)
Acid-base deficit: 3 mmol/L — ABNORMAL HIGH (ref 0.0–2.0)
Acid-base deficit: 2 mmol/L (ref 0.0–2.0)
Acid-base deficit: 1 mmol/L (ref 0.0–2.0)
Bicarbonate: 23.5 mmol/L (ref 20.0–28.0)
Bicarbonate: 23.5 mmol/L (ref 20.0–28.0)
Bicarbonate: 25.3 mmol/L (ref 20.0–28.0)
Bicarbonate: 25.6 mmol/L (ref 20.0–28.0)
Bicarbonate: 27.1 mmol/L (ref 20.0–28.0)
Calcium, Ion: 1.23 mmol/L (ref 1.15–1.40)
Calcium, Ion: 1.23 mmol/L (ref 1.15–1.40)
Calcium, Ion: 1.25 mmol/L (ref 1.15–1.40)
Calcium, Ion: 1.23 mmol/L (ref 1.15–1.40)
Calcium, Ion: 1.21 mmol/L (ref 1.15–1.40)
HCT: 48 % (ref 39.0–52.0)
HCT: 20 % — ABNORMAL LOW (ref 39.0–52.0)
HCT: 24 % — ABNORMAL LOW (ref 39.0–52.0)
HCT: 22 % — ABNORMAL LOW (ref 39.0–52.0)
HCT: 23 % — ABNORMAL LOW (ref 39.0–52.0)
Hemoglobin: 16.3 g/dL (ref 13.0–17.0)
Hemoglobin: 6.8 g/dL — CL (ref 13.0–17.0)
Hemoglobin: 8.2 g/dL — ABNORMAL LOW (ref 13.0–17.0)
Hemoglobin: 7.5 g/dL — ABNORMAL LOW (ref 13.0–17.0)
Hemoglobin: 7.8 g/dL — ABNORMAL LOW (ref 13.0–17.0)
O2 Saturation: 84 %
O2 Saturation: 87 %
O2 Saturation: 79 %
O2 Saturation: 81 %
O2 Saturation: 87 %
Patient temperature: 37.2
Patient temperature: 37.2
Patient temperature: 37.2
Patient temperature: 37.2
Patient temperature: 37.3
Potassium: 4.3 mmol/L (ref 3.5–5.1)
Potassium: 4.5 mmol/L (ref 3.5–5.1)
Potassium: 4.1 mmol/L (ref 3.5–5.1)
Potassium: 4 mmol/L (ref 3.5–5.1)
Potassium: 3.9 mmol/L (ref 3.5–5.1)
Sodium: 140 mmol/L (ref 135–145)
Sodium: 140 mmol/L (ref 135–145)
Sodium: 138 mmol/L (ref 135–145)
Sodium: 139 mmol/L (ref 135–145)
Sodium: 138 mmol/L (ref 135–145)
TCO2: 25 mmol/L (ref 22–32)
TCO2: 25 mmol/L (ref 22–32)
TCO2: 27 mmol/L (ref 22–32)
TCO2: 27 mmol/L (ref 22–32)
TCO2: 28 mmol/L (ref 22–32)
pCO2 arterial: 46 mmHg (ref 32–48)
pCO2 arterial: 42.5 mmHg (ref 32–48)
pCO2 arterial: 50.6 mmHg — ABNORMAL HIGH (ref 32–48)
pCO2 arterial: 47.9 mmHg (ref 32–48)
pCO2 arterial: 46.8 mmHg (ref 32–48)
pH, Arterial: 7.317 — ABNORMAL LOW (ref 7.35–7.45)
pH, Arterial: 7.351 (ref 7.35–7.45)
pH, Arterial: 7.308 — ABNORMAL LOW (ref 7.35–7.45)
pH, Arterial: 7.337 — ABNORMAL LOW (ref 7.35–7.45)
pH, Arterial: 7.371 (ref 7.35–7.45)
pO2, Arterial: 54 mmHg — ABNORMAL LOW (ref 83–108)
pO2, Arterial: 56 mmHg — ABNORMAL LOW (ref 83–108)
pO2, Arterial: 49 mmHg — ABNORMAL LOW (ref 83–108)
pO2, Arterial: 49 mmHg — ABNORMAL LOW (ref 83–108)
pO2, Arterial: 56 mmHg — ABNORMAL LOW (ref 83–108)

## 2024-05-31 LAB — BASIC METABOLIC PANEL WITH GFR
Anion gap: 9 (ref 5–15)
Anion gap: 10 (ref 5–15)
BUN: 36 mg/dL — ABNORMAL HIGH (ref 8–23)
BUN: 36 mg/dL — ABNORMAL HIGH (ref 8–23)
CO2: 22 mmol/L (ref 22–32)
CO2: 23 mmol/L (ref 22–32)
Calcium: 7.8 mg/dL — ABNORMAL LOW (ref 8.9–10.3)
Calcium: 7.8 mg/dL — ABNORMAL LOW (ref 8.9–10.3)
Chloride: 107 mmol/L (ref 98–111)
Chloride: 105 mmol/L (ref 98–111)
Creatinine, Ser: 0.57 mg/dL — ABNORMAL LOW (ref 0.61–1.24)
Creatinine, Ser: 0.72 mg/dL (ref 0.61–1.24)
GFR, Estimated: >60 mL/min (ref >60)
GFR, Estimated: >60 mL/min (ref >60)
Glucose, Bld: 151 mg/dL — ABNORMAL HIGH (ref 70–99)
Glucose, Bld: 160 mg/dL — ABNORMAL HIGH (ref 70–99)
Potassium: 4.2 mmol/L (ref 3.5–5.1)
Potassium: 4.2 mmol/L (ref 3.5–5.1)
Sodium: 138 mmol/L (ref 135–145)
Sodium: 138 mmol/L (ref 135–145)

## 2024-05-31 LAB — TRIGLYCERIDES: Triglycerides: 323 mg/dL — ABNORMAL HIGH (ref <150)

## 2024-05-31 LAB — CBC WITH DIFFERENTIAL/PLATELET
Abs Immature Granulocytes: 0.22 K/uL — ABNORMAL HIGH (ref 0.00–0.07)
Basophils Absolute: 0.1 K/uL (ref 0.0–0.1)
Basophils Relative: 1 %
Eosinophils Absolute: 1.1 K/uL — ABNORMAL HIGH (ref 0.0–0.5)
Eosinophils Relative: 10 %
HCT: 23.8 % — ABNORMAL LOW (ref 39.0–52.0)
Hemoglobin: 7.6 g/dL — ABNORMAL LOW (ref 13.0–17.0)
Immature Granulocytes: 2 %
Lymphocytes Relative: 21 %
Lymphs Abs: 2.2 K/uL (ref 0.7–4.0)
MCH: 32.6 pg (ref 26.0–34.0)
MCHC: 31.9 g/dL (ref 30.0–36.0)
MCV: 102.1 fL — ABNORMAL HIGH (ref 80.0–100.0)
Monocytes Absolute: 0.4 K/uL (ref 0.1–1.0)
Monocytes Relative: 4 %
Neutro Abs: 6.5 K/uL (ref 1.7–7.7)
Neutrophils Relative %: 62 %
Platelets: 164 K/uL (ref 150–400)
RBC: 2.33 MIL/uL — ABNORMAL LOW (ref 4.22–5.81)
RDW: 16.7 % — ABNORMAL HIGH (ref 11.5–15.5)
WBC: 10.5 K/uL (ref 4.0–10.5)
nRBC: 1.3 % — ABNORMAL HIGH (ref 0.0–0.2)

## 2024-05-31 LAB — GLUCOSE, CAPILLARY
Glucose-Capillary: 116 mg/dL — ABNORMAL HIGH (ref 70–99)
Glucose-Capillary: 127 mg/dL — ABNORMAL HIGH (ref 70–99)
Glucose-Capillary: 134 mg/dL — ABNORMAL HIGH (ref 70–99)
Glucose-Capillary: 165 mg/dL — ABNORMAL HIGH (ref 70–99)
Glucose-Capillary: 121 mg/dL — ABNORMAL HIGH (ref 70–99)
Glucose-Capillary: 150 mg/dL — ABNORMAL HIGH (ref 70–99)
Glucose-Capillary: 151 mg/dL — ABNORMAL HIGH (ref 70–99)

## 2024-05-31 LAB — CG4 I-STAT (LACTIC ACID): Lactic Acid, Venous: 0.9 mmol/L (ref 0.5–1.9)

## 2024-05-31 LAB — CBC
HCT: 23.8 % — ABNORMAL LOW (ref 39.0–52.0)
Hemoglobin: 7.7 g/dL — ABNORMAL LOW (ref 13.0–17.0)
MCH: 32.9 pg (ref 26.0–34.0)
MCHC: 32.4 g/dL (ref 30.0–36.0)
MCV: 101.7 fL — ABNORMAL HIGH (ref 80.0–100.0)
Platelets: 147 K/uL — ABNORMAL LOW (ref 150–400)
RBC: 2.34 MIL/uL — ABNORMAL LOW (ref 4.22–5.81)
RDW: 17.4 % — ABNORMAL HIGH (ref 11.5–15.5)
WBC: 11.7 K/uL — ABNORMAL HIGH (ref 4.0–10.5)
nRBC: 1.9 % — ABNORMAL HIGH (ref 0.0–0.2)

## 2024-05-31 LAB — HEPATIC FUNCTION PANEL
ALT: 35 U/L (ref 0–44)
AST: 35 U/L (ref 15–41)
Albumin: 1.8 g/dL — ABNORMAL LOW (ref 3.5–5.0)
Alkaline Phosphatase: 145 U/L — ABNORMAL HIGH (ref 38–126)
Bilirubin, Direct: 0.4 mg/dL — ABNORMAL HIGH (ref 0.0–0.2)
Indirect Bilirubin: 0.6 mg/dL (ref 0.3–0.9)
Total Bilirubin: 1 mg/dL (ref 0.0–1.2)
Total Protein: 5.4 g/dL — ABNORMAL LOW (ref 6.5–8.1)

## 2024-05-31 LAB — LACTATE DEHYDROGENASE: LDH: 439 U/L — ABNORMAL HIGH (ref 98–192)

## 2024-05-31 LAB — PROTIME-INR
INR: 1.3 — ABNORMAL HIGH (ref 0.8–1.2)
Prothrombin Time: 17.3 s — ABNORMAL HIGH (ref 11.4–15.2)

## 2024-05-31 LAB — APTT
aPTT: 71 s — ABNORMAL HIGH (ref 24–36)
aPTT: 73 s — ABNORMAL HIGH (ref 24–36)

## 2024-05-31 LAB — MAGNESIUM: Magnesium: 2 mg/dL (ref 1.7–2.4)

## 2024-05-31 LAB — PHOSPHORUS: Phosphorus: 4.4 mg/dL (ref 2.5–4.6)

## 2024-05-31 LAB — FIBRINOGEN: Fibrinogen: 567 mg/dL — ABNORMAL HIGH (ref 210–475)

## 2024-05-31 MED ORDER — METOLAZONE 2.5 MG PO TABS
2.5000 mg | ORAL_TABLET | Freq: Once | ORAL | Status: AC
  Administered 2024-05-31: 2.5 mg via ORAL
  Filled 2024-05-31: qty 1

## 2024-05-31 MED ORDER — STERILE WATER FOR INJECTION IJ SOLN
INTRAMUSCULAR | Status: AC
  Administered 2024-05-31: 10 mL
  Filled 2024-05-31: qty 10

## 2024-05-31 MED ORDER — FREE WATER
150.0000 mL | Status: DC
  Administered 2024-05-31 – 2024-06-01 (×6): 150 mL

## 2024-05-31 NOTE — Progress Notes (Signed)
 NAME:  Keith Gibbs, MRN:  [id number], DOB:  Feb 13, 1961, LOS: 22 ADMISSION DATE:  05/09/2024, CONSULTATION DATE: 05/14/2024 REFERRING MD: CTS, CHIEF COMPLAINT: Acute on chronic hypoxic respiratory failure  History of Present Illness:  63 yo male presented for lobectomy with CTS. Progressing as expected post operatively with CT in place and was on 4E when this morning he decompensated with worsening hypoxemia and increased wob. CTS transferred to ICU and requested CCM evaluation.   Patient went into ARDS and eventually cannulated for VV ECMO.  Pertinent  Medical History  RUL nodule s/p RUL lobectomy Bipolar 1 d/o Schizophrenia Polysubstance abuse Cad Mild to mod Ao insuff Htn Hyperlipidemia Tobacco use COPD Hep C  Significant Hospital Events: Including procedures, antibiotic start and stop dates in addition to other pertinent events   Admitted 10/16 for RUL Lobectomy Transferred to ICU 10/21  10/24 started zosyn 10/26 - Intubated.  He was difficult to oxygenate and ventilate therefore APRV started.  Started meropenem, azithromycin , cefepime, & vanc 10/27 con't vanc & mero 10/29 -nitric oxide weaned off overnight-did not change his respiratory status.  Proned. 10/29 -worsened despite proning > cannulated for VV ECMO.  Started micafungin empirically, con't previous abd- vanc & mero 10/30 -blood cultures from 05/19/2024 and BAL cultures from 05/19/2024 grew Candida glabrata 10/31 - 11/2 unable to get the patient off paralytics  11/3 tracheostomy (ENT), weaned off nimbex, TEE with repositioning of crescent cannula 11/4 weaned off ketamine , circuit exchanged at beside for failing oxygenator, increasing vasopressor requirements and new bradycardia  Interim History / Subjective:  No chugging overnight. Remains on versed , dilaudid , propofol , NE.   Objective    Blood pressure 111/63, pulse (!) 49, temperature 99 F (37.2 C), resp. rate 13, height 6' 3 (1.905 m), weight 117.7 kg,  SpO2 (!) 87%. CVP:  [77 mmHg-85 mmHg] 77 mmHg  Vent Mode: PCV FiO2 (%):  [60 %-100 %] 60 % Set Rate:  [15 bmp] 15 bmp PEEP:  [10 cmH20] 10 cmH20 Plateau Pressure:  [17 cmH20-20 cmH20] 17 cmH20   Intake/Output Summary (Last 24 hours) at 05/31/2024 0714 Last data filed at 05/31/2024 0700 Gross per 24 hour  Intake 6598.16 ml  Output 3215 ml  Net 3383.16 ml   Filed Weights   05/28/24 0500 05/30/24 0500 05/31/24 0500  Weight: 106 kg 113.7 kg 117.7 kg    Examination: General: critically ill appearing man lying in bed in NAD HEENT: [place]/AT, eyes anicteric  Neck: trach without bleeding around stoma Lungs: breathing comfortably on MV, synchronous. Minimal thin blood-tinged secretions from trach Cardiovascular: S1S2, bradycardic, reg rhythm Abdomen: soft, NT Extremities: +edema, no cyanosis Neuro:  RASS -5, pinpoint pupils    I/O +3.4L, net -2.3L  Ventilator PC 15/10/10/60%, about 60cc Vt   VV ECMO Flow: 3700 RMP>  4.5 L  Sweep: 9 L/min   7.32/46/54/24 BUN 36 Cr 0.57 LDH 439  WBC 10.5 H/H 7.6/23.8 Platelet 164 PTT 71  CXR personally reviewed> trach in place, R chest tube, near complete opacification of R hemithorax, ongoing dense consolidation in L lung  Resolved problem list  Hypernatremia  Assessment and Plan   Acute hypoxic and hypercapnic respiratory failure due to ARDS in the setting of candida glabrata and pseudomonas pneumonia s/p VV ECMO s/p tracheostomy 11/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 History of COPD -con't VV ECMO support and ultralung protective ventilation. Increase PS to 15 today> still no air leak with higher volumes in 100s now.  -bival and iNO for circuit -goal  flows >4L, normal pH, SpO2 >80% -con't bronchodilators -cont micafungin -PAD protocol- dilaudid , propofol . Con't clonazepam, olazapine, oxycodone .  -lasix, metolazone  Acute encephalopathy due to sedation History of anxiety, schizoaffective disorder bipolar type  -con't  sedation; daily attempts to lighten sedation -olanzapine  -con't holding home Cymbalta  and depakote  (depakote  could be restarted now that he's off meropenem)  Sinus bradycardia> suspect this is related to loss of sympathetic effects of ketamine  Hypotension -monitor; HR improving today -avoid precedex  -titrate NE and vaso as needed to maintain MAP >65  Pseudomonas and candida glabrata pneumonia  Candida glabrata fungemia  -completed 14 days of meropenem -micafungin per ID; anticipate a long course with cannula that cannot be removed at this time -eventually needs ophtho evaluation  AKI, resolved  -free water  decreased to 150cc q4h -daily electrolytes, replete as needed  Elevated transaminases, improving  -trend  RLE DVT  -bival; 3 months minimum needed for provoked clot  Anemia in the setting of critical illness and acute blood loss -transfuse for Hb <7 or hemodynamically significant bleeding  Malnutrition  -con't TF at goal  Deconditioning -expect a long rehab course  GI prophylaxis: PPI  DVT prophylaxis: bival infusion   Plan discussed with ECMO team during multidisciplinary rounds.  Family meeting today- see ipal note.     Labs   CBC: Recent Labs  Lab 05/28/24 0420 05/28/24 0739 05/28/24 2355 05/29/24 0428 05/29/24 0445 05/29/24 1623 05/29/24 1629 05/30/24 0420 05/30/24 1205 05/30/24 1550 05/30/24 1824 05/31/24 0427 05/31/24 0437  WBC 21.2*   < > 13.0*  --  13.0* 12.6*  --  12.2*  --  11.0*  --  10.5  --   NEUTROABS 16.5*  --  7.9*  --  7.4  --   --  7.6  --   --   --  6.5  --   HGB 8.3*   < > 7.2*   < > 8.2* 7.7*   < > 8.0*  7.8* 6.8* 7.5* 6.8* 7.6* 16.3  HCT 26.6*   < > 23.6*   < > 25.6* 24.1*   < > 25.3*  23.0* 20.0* 23.9* 20.0* 23.8* 48.0  MCV 104.3*   < > 105.8*  --  101.6* 101.7*  --  103.3*  --  102.1*  --  102.1*  --   PLT 217   < > 214  --  215 201  --  188  --  171  --  164  --    < > = values in this interval not displayed.    Basic  Metabolic Panel: Recent Labs  Lab 05/26/24 0450 05/26/24 0802 05/29/24 0445 05/29/24 1451 05/29/24 1623 05/29/24 1629 05/30/24 0420 05/30/24 1205 05/30/24 1550 05/30/24 1824 05/30/24 2316 05/31/24 0427 05/31/24 0437  NA 148*   < > 151*   < > 146*   < > 143  146* 143 142 146*  --  138 140  K 4.7   < > 3.7  --  3.9   < > 4.3  4.4 4.6 4.2 3.9  --  4.2 4.3  CL 112*   < > 117*  --  114*  --  111  --  112*  --   --  107  --   CO2 28   < > 25  --  24  --  22  --  23  --   --  22  --   GLUCOSE 115*   < > 145*  --  134*  --  164*  --  145*  --   --  151*  --   BUN 42*   < > 42*  --  37*  --  38*  --  37*  --   --  36*  --   CREATININE 0.71   < > 0.70  --  0.65  --  0.77  --  0.57*  --   --  0.57*  --   CALCIUM  8.1*   < > 7.9*  --  7.7*  --  8.0*  --  7.3*  --   --  7.8*  --   MG 2.3  --  2.2  --   --   --  2.2  --   --   --  1.9 2.0  --   PHOS  --   --  4.3  --   --   --  4.9*  --   --   --  3.9 4.4  --    < > = values in this interval not displayed.   GFR: Estimated Creatinine Clearance: 130.7 mL/min (A) (by C-G formula based on SCr of 0.57 mg/dL (L)). Recent Labs  Lab 05/27/24 2133 05/28/24 0420 05/28/24 0855 05/28/24 1522 05/28/24 1524 05/29/24 0656 05/29/24 1623 05/30/24 0420 05/30/24 1550 05/31/24 0427  WBC  --    < >  --   --    < >  --  12.6* 12.2* 11.0* 10.5  LATICACIDVEN 0.7  --  0.5 0.6  --  0.5  --   --   --   --    < > = values in this interval not displayed.    Liver Function Tests: Recent Labs  Lab 05/27/24 0433 05/28/24 0420 05/29/24 0445 05/30/24 0420 05/31/24 0427  AST 89* 78* 48* 35 35  ALT 102* 85* 60* 46* 35  ALKPHOS 146* 180* 166* 166* 145*  BILITOT 1.3* 2.8* 1.7* 1.2 1.0  PROT 5.1* 5.0* 5.0* 5.4* 5.4*  ALBUMIN  1.8* 1.8* 1.7* 2.0* 1.8*   This patient is critically ill with multiple organ system failure which requires frequent high complexity decision making, assessment, support, evaluation, and titration of therapies. This was completed  through the application of advanced monitoring technologies and extensive interpretation of multiple databases. During this encounter critical care time was devoted to patient care services described in this note for 65 minutes.  Leita SHAUNNA Gaskins, DO 05/31/24 4:57 PM [facility]  For contact information, see Amion. If no response to pager, please call PCCM consult pager. After hours, 7PM- 7AM, please call Elink.

## 2024-05-31 NOTE — Progress Notes (Signed)
 PHARMACY - ANTICOAGULATION CONSULT NOTE  Pharmacy Consult for heparin  >> bivalirudin  Indication: VTE, VV ECMO (10/29 - current)  Allergies  Allergen Reactions   Asenapine Other (See Comments) and Nausea And Vomiting    Increased tremors   Dextromethorphan Hbr    Guaifenesin     Latuda [Lurasidone Hcl] Other (See Comments)    Tremors     Lurasidone Other (See Comments) and Hives    Other Reaction(s): Angioedema   Phenylephrine      Patient Measurements: Height: 6' 3 (190.5 cm) Weight: 117.7 kg (259 lb 7.7 oz) IBW/kg (Calculated) : 84.5  Vital Signs: Temp: 98.6 F (37 C) (11/07 1230) BP: 121/58 (11/07 1432) Pulse Rate: 60 (11/07 1230)  Labs: Recent Labs    05/29/24 0445 05/29/24 1623 05/30/24 0420 05/30/24 1200 05/30/24 1550 05/30/24 1824 05/31/24 0427 05/31/24 0437 05/31/24 0900 05/31/24 1540 05/31/24 1548  HGB 8.2*   < > 8.0*  7.8*   < > 7.5*   < > 7.6*   < > 6.8* 7.7* 8.2*  HCT 25.6*   < > 25.3*  23.0*   < > 23.9*   < > 23.8*   < > 20.0* 23.8* 24.0*  PLT 215   < > 188  --  171  --  164  --   --  147*  --   APTT 59*   < > 60*   < > 80*  --  71*  --   --  73*  --   LABPROT 20.5*  --  17.1*  --   --   --  17.3*  --   --   --   --   INR 1.7*  --  1.3*  --   --   --  1.3*  --   --   --   --   CREATININE 0.70   < > 0.77  --  0.57*  --  0.57*  --   --  0.72  --    < > = values in this interval not displayed.    Estimated Creatinine Clearance: 130.7 mL/min (by C-G formula based on SCr of 0.72 mg/dL).   Assessment: 1 yoM admitted for RUL lobectomy.  Pt intubated, now having difficulty with ventilation. Placed on VV ECMO 10/29 in the evening. Pharmacy to dose IV bivalirudin .   Previously on therapeutic heparin  (HL 0.33 on 1700/h) for acute DVT of gastrocnemius vein  > changed to bivalirudin  with ECMO cannulation  S/p circuit change 11/4  aPTT 73 sec is therapeutic for new goal. No bleeding noted.  Goal of Therapy:  aPTT goal increased to 70-80 sec on  11/7 Monitor platelets by anticoagulation protocol: Yes   Plan:  Continue bivalirudin  0.11 mg/kg/hr Q12h aPTT and CBC  Takira Sherrin D. Lendell, PharmD, BCPS, BCCCP 05/31/2024, 5:39 PM

## 2024-05-31 NOTE — Progress Notes (Signed)
 4 Days Post-Op Procedure(s) (LRB): CREATION, TRACHEOSTOMY (N/A) Subjective: Intubated, sedated  Objective: Vital signs in last 24 hours: Temp:  [98.6 F (37 C)-99 F (37.2 C)] 98.6 F (37 C) (11/07 1230) Pulse Rate:  [44-67] 60 (11/07 1230) Cardiac Rhythm: Sinus bradycardia (11/07 0800) Resp:  [0-20] 16 (11/07 1432) BP: (85-128)/(43-69) 121/58 (11/07 1432) SpO2:  [78 %-92 %] 80 % (11/07 1432) Arterial Line BP: (93-142)/(23-72) 129/52 (11/07 1230) FiO2 (%):  [60 %] 60 % (11/07 1432) Weight:  [117.7 kg] 117.7 kg (11/07 0500)  Hemodynamic parameters for last 24 hours: CVP:  [77 mmHg-85 mmHg] 77 mmHg  Intake/Output from previous day: 11/06 0701 - 11/07 0700 In: 6598.2 [I.V.:2193.3; WH/HU:5934; IV Piggyback:309.8] Out: 3215 [Urine:1955; Stool:1200; Chest Tube:60] Intake/Output this shift: Total I/O In: 1368.5 [I.V.:698.5; NG/GT:670] Out: 2000 [Urine:2000]  General appearance: ill-appearing Neurologic: sedated Heart: brady, regular Lungs: minimal air movement  Lab Results: Recent Labs    05/31/24 0427 05/31/24 0437 05/31/24 1540 05/31/24 1548  WBC 10.5  --  11.7*  --   HGB 7.6*   < > 7.7* 8.2*  HCT 23.8*   < > 23.8* 24.0*  PLT 164  --  147*  --    < > = values in this interval not displayed.   BMET:  Recent Labs    05/30/24 1550 05/30/24 1824 05/31/24 0427 05/31/24 0437 05/31/24 0900 05/31/24 1548  NA 142   < > 138   < > 140 138  K 4.2   < > 4.2   < > 4.5 4.1  CL 112*  --  107  --   --   --   CO2 23  --  22  --   --   --   GLUCOSE 145*  --  151*  --   --   --   BUN 37*  --  36*  --   --   --   CREATININE 0.57*  --  0.57*  --   --   --   CALCIUM  7.3*  --  7.8*  --   --   --    < > = values in this interval not displayed.    PT/INR:  Recent Labs    05/31/24 0427  LABPROT 17.3*  INR 1.3*   ABG    Component Value Date/Time   PHART 7.308 (L) 05/31/2024 1548   HCO3 25.3 05/31/2024 1548   TCO2 27 05/31/2024 1548   ACIDBASEDEF 1.0 05/31/2024 1548    O2SAT 79 05/31/2024 1548   CBG (last 3)  Recent Labs    05/31/24 0725 05/31/24 1219 05/31/24 1546  GLUCAP 127* 134* 165*    Assessment/Plan: S/P Procedure(s) (LRB): CREATION, TRACHEOSTOMY (N/A) Remains critically ill NEURO- sedated CV- in SB/ SR  Vasopressin  0.04, off norepi RESP- VDRF, ARDS  Now on PCV 15/ 10 PEEP/ 60%  VV ECMO  No air leak, postop space- tube in place ID- Candida glabrata pneumonia, sepsis  On micafungin  Completed meropenem  WBC 11.7, afebrile RENAL- creatinine normal  On Lasix drip  Hypernatremia resolved GI- tolerating tube feedings DVT - on bivalrudin for ECMO   LOS: 22 days    Keith Gibbs 05/31/2024

## 2024-05-31 NOTE — TOC Progression Note (Signed)
 Transition of Care [facility]) - Progression Note    Patient Details  Name: Keith Gibbs MRN: [id number] Date of Birth: [birth year]  [facility] CM/SW Contact  Justina Delcia Czar, RN Phone Number: [phone] 05/31/2024, 10:15 AM  Clinical Narrative:     Patient remain intubated, ECMO and vasopressors.   Patient lives at home with mother.    Chart reviewed for discharge readiness, patient not medically stable for d/c. Inpatient CM/CSW will continue to monitor pt's advancement through interdisciplinary progression rounds.    If new pt transition needs arise, MD please place a TOC consult.    Expected Discharge Plan: Home w Home Health Services Barriers to Discharge: Continued Medical Work up    Expected Discharge Plan and Services   Discharge Planning Services: CM Consult   Living arrangements for the past 2 months: Single Family Home                   Social Drivers of Health (SDOH) Interventions SDOH Screenings   Food Insecurity: No Food Insecurity (05/09/2024)  Recent Concern: Food Insecurity - Food Insecurity Present (04/16/2024)  Housing: Unknown (05/10/2024)  Recent Concern: Housing - High Risk (04/16/2024)  Transportation Needs: No Transportation Needs (05/09/2024)  Utilities: Not At Risk (05/09/2024)  Alcohol  Screen: Low Risk  (01/29/2023)  Depression (PHQ2-9): Medium Risk (04/05/2024)  Financial Resource Strain: High Risk (04/16/2024)  Physical Activity: Inactive (04/16/2024)  Social Connections: Moderately Integrated (05/10/2024)  Stress: Stress Concern Present (04/16/2024)  Tobacco Use: Medium Risk (05/09/2024)  Health Literacy: Adequate Health Literacy (12/19/2023)    Readmission Risk Interventions    03/01/2022   11:27 AM  Readmission Risk Prevention Plan  Transportation Screening Complete  HRI or Home Care Consult Complete  Social Work Consult for Recovery Care Planning/Counseling Complete  Palliative Care Screening Not Applicable  Medication  Review Oceanographer) Complete

## 2024-05-31 NOTE — CV Procedure (Signed)
 ECMO NOTE:   Indication: Respiratory failure due to ARDS   Initial cannulation date: 05/22/24   ECMO type: VV ECMO   Dual lumen inflow/return cannula:   1) 75F [place] placed in RIJ    Daily data:   Flow 4.26L RPM 3700 Sweep  9L Pven -73   Labs:   ABG    Component Value Date/Time   PHART 7.351 05/31/2024 0900   PCO2ART 42.5 05/31/2024 0900   PO2ART 56 (L) 05/31/2024 0900   HCO3 23.5 05/31/2024 0900   TCO2 25 05/31/2024 0900   ACIDBASEDEF 2.0 05/31/2024 0900   O2SAT 87 05/31/2024 0900    Hgb 7.6 Platelets 164 LDH 439 PTT 71 goal of 70-80.   Plan:   Continue VV ECMO support Await lung recovery, expect prolonged course No significant fibrosis on chest CT per CCM Appears to have worsening volume, restart and increase IV diuretics, 2.5mg  of metolazone Continue antifungals, completed course of meropenem forHAP Continue to wean sedation Nitric oxide through the circuit Small deposits at 3 and 9   Morene JINNY Brownie, MD  12:26 PM

## 2024-05-31 NOTE — Progress Notes (Signed)
 Palliative Medicine Progress Note   Patient Name: Keith Gibbs       Date: 05/31/2024 DOB: 1961/04/25  Age: 63 y.o. MRN#: [id number] Attending Physician: Kerrin Elspeth BROCKS, MD Primary Care Physician: Jaycee Greig PARAS, NP Admit Date: 05/09/2024   HPI/Patient Profile: 63 y.o. male  with past medical history of COPD, former tobacco use, CAD, hepatitis C, HTN, HLD, bipolar 1 disorder, and schizophrenia.  He was admitted on 05/09/2024 for lobectomy of RUL nodule.  He was initially progressing as expected postoperatively, however on 10/21 he decompensated with worsening hypoxemia.  He ultimately went into ARDS and was cannulated for VV ECMO on 10/29.   Palliative Medicine has been consulted for goals of care discussions and complex medical decision making.    Subjective: Chart reviewed. Update received from RN. Patient assessed. Remains on low-dose levophed and vasopressin .   PMT was contacted by Manuelita Rummer NP, who is patient's niece. Per her request, I spoke with patient's sister/Rosa by phone to set-up a family meeting. They are available to meet tomorrow after 1 pm. I explained I would need to discuss with the medical team prior to scheduling a meeting.   Discussed with Dr. Gretta; she is going to speak and Dr. Kerrin in the morning and will let me know so that I can confirm with family.     Objective:  Physical Exam Vitals reviewed.  Constitutional:      General: He is not in acute distress.    Appearance: He is ill-appearing.     Interventions: He is sedated.  Cardiovascular:     Rate and Rhythm: Bradycardia present.  Pulmonary:     Comments: Trach/vent             Palliative Medicine Assessment & Plan   Assessment: Principal Problem:   S/P lobectomy of  lung Active Problems:   Status post robot-assisted surgical procedure   Acute hypoxic respiratory failure (HCC)   Anxiety disorder due to medical condition   Malnutrition of moderate degree   Acute respiratory distress syndrome (ARDS) due to COVID-19 virus (HCC)   Candidemia (HCC)   Central line infection   Pseudomonas aeruginosa infection   Ventilator associated pneumonia [facility])   Patient receiving extracorporeal membrane oxygenation (ECMO)    Recommendations/Plan: Family requesting a meeting tomorrow - will need to discuss with  the medical team first prior to scheduling this Ongoing palliative support   Code Status: Full code   Prognosis:  Unable to determine  Discharge Planning: To Be Determined   Thank you for allowing the Palliative Medicine Team to assist in the care of this patient.   Time: 26 minutes   Recardo KATHEE Loll, NP   Please contact Palliative Medicine Team phone at [phone] for questions and concerns.  For individual providers, please see AMION.

## 2024-05-31 NOTE — Progress Notes (Signed)
 Advanced Heart Failure Rounding Note   Subjective:     Placed on VV ECMO 05/22/2024  No significant events overnight, pressor requirement is improving, now off levo. Start aggressive diuresis today. Continuing to wean sedation. CXR more wet today.   Objective:   Weight Range:  Vital Signs:   Temp:  [98.6 F (37 C)-99 F (37.2 C)] 99 F (37.2 C) (11/07 0930) Pulse Rate:  [42-67] 67 (11/07 1132) Resp:  [0-20] 20 (11/07 1132) BP: (87-128)/(43-69) 109/69 (11/07 0915) SpO2:  [80 %-92 %] 88 % (11/07 0930) Arterial Line BP: (90-138)/(41-72) 136/55 (11/07 0930) FiO2 (%):  [60 %] 60 % (11/07 1132) Weight:  [117.7 kg] 117.7 kg (11/07 0500) Last BM Date : 05/30/24  Weight change: Filed Weights   05/28/24 0500 05/30/24 0500 05/31/24 0500  Weight: 106 kg 113.7 kg 117.7 kg    Intake/Output:   Intake/Output Summary (Last 24 hours) at 05/31/2024 1229 Last data filed at 05/31/2024 1200 Gross per 24 hour  Intake 5192.57 ml  Output 3345 ml  Net 1847.57 ml     Physical Exam: General: intubated/sedated HEENT: + ETT Neck: RIJ cannula, no bleeding, good color change, delta P around 25 CV: RRR, warm and well perfused extremities  Lungs: diminished, extremely limited chest wall movement Abdomen: soft, nontender, nondistended Neuro:sedated    Labs: Basic Metabolic Panel: Recent Labs  Lab 05/26/24 0450 05/26/24 0802 05/29/24 0445 05/29/24 1451 05/29/24 1623 05/29/24 1629 05/30/24 0420 05/30/24 1205 05/30/24 1550 05/30/24 1824 05/30/24 2316 05/31/24 0427 05/31/24 0437 05/31/24 0900  NA 148*   < > 151*   < > 146*   < > 143  146*   < > 142 146*  --  138 140 140  K 4.7   < > 3.7  --  3.9   < > 4.3  4.4   < > 4.2 3.9  --  4.2 4.3 4.5  CL 112*   < > 117*  --  114*  --  111  --  112*  --   --  107  --   --   CO2 28   < > 25  --  24  --  22  --  23  --   --  22  --   --   GLUCOSE 115*   < > 145*  --  134*  --  164*  --  145*  --   --  151*  --   --   BUN 42*   < > 42*   --  37*  --  38*  --  37*  --   --  36*  --   --   CREATININE 0.71   < > 0.70  --  0.65  --  0.77  --  0.57*  --   --  0.57*  --   --   CALCIUM  8.1*   < > 7.9*  --  7.7*  --  8.0*  --  7.3*  --   --  7.8*  --   --   MG 2.3  --  2.2  --   --   --  2.2  --   --   --  1.9 2.0  --   --   PHOS  --   --  4.3  --   --   --  4.9*  --   --   --  3.9 4.4  --   --    < > =  values in this interval not displayed.    Liver Function Tests: Recent Labs  Lab 05/27/24 0433 05/28/24 0420 05/29/24 0445 05/30/24 0420 05/31/24 0427  AST 89* 78* 48* 35 35  ALT 102* 85* 60* 46* 35  ALKPHOS 146* 180* 166* 166* 145*  BILITOT 1.3* 2.8* 1.7* 1.2 1.0  PROT 5.1* 5.0* 5.0* 5.4* 5.4*  ALBUMIN  1.8* 1.8* 1.7* 2.0* 1.8*   No results for input(s): LIPASE, AMYLASE in the last 168 hours. No results for input(s): AMMONIA in the last 168 hours.  CBC: Recent Labs  Lab 05/28/24 0420 05/28/24 0739 05/28/24 2355 05/29/24 0428 05/29/24 0445 05/29/24 1623 05/29/24 1629 05/30/24 0420 05/30/24 1205 05/30/24 1550 05/30/24 1824 05/31/24 0427 05/31/24 0437 05/31/24 0900  WBC 21.2*   < > 13.0*  --  13.0* 12.6*  --  12.2*  --  11.0*  --  10.5  --   --   NEUTROABS 16.5*  --  7.9*  --  7.4  --   --  7.6  --   --   --  6.5  --   --   HGB 8.3*   < > 7.2*   < > 8.2* 7.7*   < > 8.0*  7.8*   < > 7.5* 6.8* 7.6* 16.3 6.8*  HCT 26.6*   < > 23.6*   < > 25.6* 24.1*   < > 25.3*  23.0*   < > 23.9* 20.0* 23.8* 48.0 20.0*  MCV 104.3*   < > 105.8*  --  101.6* 101.7*  --  103.3*  --  102.1*  --  102.1*  --   --   PLT 217   < > 214  --  215 201  --  188  --  171  --  164  --   --    < > = values in this interval not displayed.    Cardiac Enzymes: No results for input(s): CKTOTAL, CKMB, CKMBINDEX, TROPONINI in the last 168 hours.  BNP: BNP (last 3 results) Recent Labs    03/20/24 1419  BNP 20.6    ProBNP (last 3 results) No results for input(s): PROBNP in the last 8760 hours.      Medications:      Scheduled Medications:  arformoterol  15 mcg Nebulization BID   artificial tears  1 Application Both Eyes Q8H   Chlorhexidine  Gluconate Cloth  6 each Topical Daily   clonazePAM  1 mg Oral Q8H   feeding supplement (PROSource TF20)  60 mL Per Tube QID   fiber supplement (BANATROL TF)  60 mL Per Tube BID   free water   150 mL Per Tube Q4H   gabapentin   600 mg Oral Daily   And   gabapentin   1,200 mg Oral QHS   insulin aspart  0-15 Units Subcutaneous Q4H   OLANZapine   5 mg Intravenous Q8H   mouth rinse  15 mL Mouth Rinse Q2H   oxyCODONE   10 mg Oral QID   pantoprazole  (PROTONIX ) IV  40 mg Intravenous QHS   polyethylene glycol  17 g Oral Daily   revefenacin  175 mcg Nebulization Daily   senna-docusate  2 tablet Oral QHS   sodium chloride  flush  10-40 mL Intracatheter Q12H   sodium chloride  flush  10-40 mL Intracatheter Q12H    Infusions:  albumin  human Stopped (05/29/24 2308)   bivalirudin  (ANGIOMAX ) 250 mg in sodium chloride  0.9 % 500 mL (0.5 mg/mL) infusion 0.11 mg/kg/hr (05/31/24 0729)   feeding supplement (VITAL  1.5 CAL) 65 mL/hr at 05/31/24 0700   furosemide (LASIX) 200 mg in dextrose  5 % 100 mL (2 mg/mL) infusion 8 mg/hr (05/31/24 9061)   HYDROmorphone  5 mg/hr (05/31/24 0740)   micafungin (MYCAMINE) 200 mg in sodium chloride  0.9 % 100 mL IVPB Stopped (05/30/24 1911)   midazolam  7 mg/hr (05/31/24 0843)   niCARDipine Stopped (05/24/24 2221)   norepinephrine (LEVOPHED) Adult infusion 7 mcg/min (05/31/24 0700)   propofol  (DIPRIVAN ) infusion 50 mcg/kg/min (05/31/24 1012)   vasopressin  0.04 Units/min (05/31/24 0831)    PRN Medications: albumin  human, hydrALAZINE , HYDROmorphone , hydrOXYzine , ipratropium-albuterol , lactulose, midazolam , ondansetron  (ZOFRAN ) IV, mouth rinse, mouth rinse, sodium chloride  flush, sodium chloride  flush   Assessment/Plan:   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 - s/p VV ECMO cannulation 05/22/24 - VV ECMO  flows look good, significant fibrin buildup on previous oxygenator - S/p circuit change on 11/4, improvement in post gas from 151 to 540  - Goal PTT of 70-80, discussed goals with pharmacy - Nitric oxide through the circuit for anticoagulation and long pump run - Continue to wean sedation slowly - TEE with cannula adjustment 11/3 - Completed course of meropenem on 11/7 - TV remain extremely low, suspect prolonged recovery - Start diuresis today, IV lasix gtt at 8 plus metolazone - CCM managing vent and R chest tube - Trach 11/3, appreciate ENT - Continue micafungin - Transfuse hgb < 7.5  Bradycardia/hypotension: - Intermittent, continued sinus bradycardia. Improved today - Continue vasopressors  RUL lung CA s/p lobectomy COPD with ongoing tobacco use Bipolar d/o CAD Candida glabrata fungemia/PNA - ID following - on micafungin for fungemia, ID following, course to be determined given indwelling lines - steroids off - Completed meropenem 11/7  HTN - Improved  Remains critically ill with ARDS/fungal PNA/PTX on top of severe underlying lung disease. Long run expected  CRITICAL CARE Performed by: Morene JINNY Brownie   Total critical care time: 38 minutes  Critical care time was exclusive of separately billable procedures and treating other patients.  Critical care was necessary to treat or prevent imminent or life-threatening deterioration.  Critical care was time spent personally by me on the following activities: development of treatment plan with patient and/or surrogate as well as nursing, discussions with consultants, evaluation of patient's response to treatment, examination of patient, obtaining history from patient or surrogate, ordering and performing treatments and interventions, ordering and review of laboratory studies, ordering and review of radiographic studies, pulse oximetry and re-evaluation of patient's condition.

## 2024-05-31 NOTE — IPAL (Signed)
  Interdisciplinary Goals of Care Family Meeting   Date carried out:: 05/31/2024  Location of the meeting: Conference room  Member's involved: Physician, Bedside Registered Nurse, Family Member or next of kin, Palliative care team member, and Other: Drs. Kerrin and [employer].  Durable Power of Insurance risk surveyor: mother    Discussion: We discussed goals of care for Keith Gibbs .  Drs. Kerrin Brownie, NP [place], and I met with Keith Gibbs's mother and 2 sisters to discuss his ongoing care. We reviewed his fungal pneumonia, expected long clinical course, and expected long ECMO run. His recovery will be long and will most likely require LTAC and rehab before he would be able to go home. Based on CT scan he does not have irreversible/ unrecoverable lung injury that we can see at this point. His family understands we are still early in his course and that the goal is for him to eventually wean from ECMO, vent, and trach. He was very active prior to admission. They understand there is still the possibility of future setbacks, but so far we are having the slow course we expected. His family wants to continue his current care, but if we ever feel his condition has become irreversible, at that time they would not want to continue. We discussed code status and agree that DNR in the event of a cardiac arrest would be appropriate given the significant setback this would be.   Code status: Limited Code or DNR with short term, Mechanical ventilation, Cental IV access, IV vasoactive drugs, and IV antiarrhythmic  Disposition: Continue current acute care   Time spent for the meeting: 25 min.  Keith Gibbs 05/31/2024, 2:46 PM

## 2024-05-31 NOTE — Progress Notes (Signed)
 PHARMACY - ANTICOAGULATION CONSULT NOTE  Pharmacy Consult for heparin  >> bivalirudin  Indication: VTE, VV ECMO (10/29 - current)  Allergies  Allergen Reactions   Asenapine Other (See Comments) and Nausea And Vomiting    Increased tremors   Dextromethorphan Hbr    Guaifenesin     Latuda [Lurasidone Hcl] Other (See Comments)    Tremors     Lurasidone Other (See Comments) and Hives    Other Reaction(s): Angioedema   Phenylephrine      Patient Measurements: Height: 6' 3 (190.5 cm) Weight: 117.7 kg (259 lb 7.7 oz) IBW/kg (Calculated) : 84.5  Vital Signs: Temp: 98.6 F (37 C) (11/07 1230) BP: 121/58 (11/07 1432) Pulse Rate: 60 (11/07 1230)  Labs: Recent Labs    05/29/24 0445 05/29/24 1623 05/30/24 0420 05/30/24 1200 05/30/24 1205 05/30/24 1550 05/30/24 1824 05/31/24 0427 05/31/24 0437 05/31/24 0900 05/31/24 1548  HGB 8.2*   < > 8.0*  7.8*  --    < > 7.5*   < > 7.6* 16.3 6.8* 8.2*  HCT 25.6*   < > 25.3*  23.0*  --    < > 23.9*   < > 23.8* 48.0 20.0* 24.0*  PLT 215   < > 188  --   --  171  --  164  --   --   --   APTT 59*   < > 60* 75*  --  80*  --  71*  --   --   --   LABPROT 20.5*  --  17.1*  --   --   --   --  17.3*  --   --   --   INR 1.7*  --  1.3*  --   --   --   --  1.3*  --   --   --   CREATININE 0.70   < > 0.77  --   --  0.57*  --  0.57*  --   --   --    < > = values in this interval not displayed.    Estimated Creatinine Clearance: 130.7 mL/min (A) (by C-G formula based on SCr of 0.57 mg/dL (L)).   Assessment: 29 yoM admitted for RUL lobectomy.  Pt intubated, now having difficulty with ventilation. Placed on VV ECMO 10/29 in the evening. Pharmacy to dose IV bivalirudin .   Previously on therapeutic heparin  (HL 0.33 on 1700/h) for acute DVT of gastrocnemius vein  > changed to bivalirudin  with ECMO cannulation  S/p circuit change 11/4  aPTT 71 sec for new goal of 70-80. No bleeding noted.  Goal of Therapy:  aPTT goal increased to 60-70 sec (11/4 post  circuit change) Monitor platelets by anticoagulation protocol: Yes   Plan:  Continue bivalirudin  0.11 mg/kg/hr Q12h aPTT and CBC  Harlene Barlow, Berdine BIRCH, BCPS, BCCP Clinical Pharmacist  05/31/2024 4:07 PM   [facility] pharmacy phone numbers are listed on [email or web address]

## 2024-06-01 ENCOUNTER — Inpatient Hospital Stay (HOSPITAL_COMMUNITY): Payer: MEDICAID

## 2024-06-01 DIAGNOSIS — J9622 Acute and chronic respiratory failure with hypercapnia: Secondary | ICD-10-CM | POA: Diagnosis not present

## 2024-06-01 DIAGNOSIS — B371 Pulmonary candidiasis: Secondary | ICD-10-CM | POA: Diagnosis not present

## 2024-06-01 DIAGNOSIS — Z902 Acquired absence of lung [part of]: Secondary | ICD-10-CM | POA: Diagnosis not present

## 2024-06-01 DIAGNOSIS — J80 Acute respiratory distress syndrome: Secondary | ICD-10-CM | POA: Diagnosis not present

## 2024-06-01 DIAGNOSIS — J9621 Acute and chronic respiratory failure with hypoxia: Secondary | ICD-10-CM | POA: Diagnosis not present

## 2024-06-01 DIAGNOSIS — J939 Pneumothorax, unspecified: Secondary | ICD-10-CM | POA: Diagnosis not present

## 2024-06-01 LAB — POCT I-STAT 7, (LYTES, BLD GAS, ICA,H+H)
Acid-Base Excess: 4 mmol/L — ABNORMAL HIGH (ref 0.0–2.0)
Acid-Base Excess: 5 mmol/L — ABNORMAL HIGH (ref 0.0–2.0)
Acid-Base Excess: 2 mmol/L (ref 0.0–2.0)
Acid-Base Excess: 5 mmol/L — ABNORMAL HIGH (ref 0.0–2.0)
Acid-Base Excess: 6 mmol/L — ABNORMAL HIGH (ref 0.0–2.0)
Acid-Base Excess: 7 mmol/L — ABNORMAL HIGH (ref 0.0–2.0)
Acid-Base Excess: 6 mmol/L — ABNORMAL HIGH (ref 0.0–2.0)
Acid-base deficit: 3 mmol/L — ABNORMAL HIGH (ref 0.0–2.0)
Bicarbonate: 29.1 mmol/L — ABNORMAL HIGH (ref 20.0–28.0)
Bicarbonate: 29.1 mmol/L — ABNORMAL HIGH (ref 20.0–28.0)
Bicarbonate: 23.5 mmol/L (ref 20.0–28.0)
Bicarbonate: 26.9 mmol/L (ref 20.0–28.0)
Bicarbonate: 29.1 mmol/L — ABNORMAL HIGH (ref 20.0–28.0)
Bicarbonate: 30.2 mmol/L — ABNORMAL HIGH (ref 20.0–28.0)
Bicarbonate: 31.7 mmol/L — ABNORMAL HIGH (ref 20.0–28.0)
Bicarbonate: 32 mmol/L — ABNORMAL HIGH (ref 20.0–28.0)
Calcium, Ion: 1.19 mmol/L (ref 1.15–1.40)
Calcium, Ion: 1.14 mmol/L — ABNORMAL LOW (ref 1.15–1.40)
Calcium, Ion: 1.09 mmol/L — ABNORMAL LOW (ref 1.15–1.40)
Calcium, Ion: 1.11 mmol/L — ABNORMAL LOW (ref 1.15–1.40)
Calcium, Ion: 1.11 mmol/L — ABNORMAL LOW (ref 1.15–1.40)
Calcium, Ion: 1.11 mmol/L — ABNORMAL LOW (ref 1.15–1.40)
Calcium, Ion: 1.13 mmol/L — ABNORMAL LOW (ref 1.15–1.40)
Calcium, Ion: 1.11 mmol/L — ABNORMAL LOW (ref 1.15–1.40)
HCT: 22 % — ABNORMAL LOW (ref 39.0–52.0)
HCT: 33 % — ABNORMAL LOW (ref 39.0–52.0)
HCT: 33 % — ABNORMAL LOW (ref 39.0–52.0)
HCT: 25 % — ABNORMAL LOW (ref 39.0–52.0)
HCT: 21 % — ABNORMAL LOW (ref 39.0–52.0)
HCT: 22 % — ABNORMAL LOW (ref 39.0–52.0)
HCT: 22 % — ABNORMAL LOW (ref 39.0–52.0)
HCT: 24 % — ABNORMAL LOW (ref 39.0–52.0)
Hemoglobin: 7.5 g/dL — ABNORMAL LOW (ref 13.0–17.0)
Hemoglobin: 11.2 g/dL — ABNORMAL LOW (ref 13.0–17.0)
Hemoglobin: 11.2 g/dL — ABNORMAL LOW (ref 13.0–17.0)
Hemoglobin: 8.5 g/dL — ABNORMAL LOW (ref 13.0–17.0)
Hemoglobin: 7.1 g/dL — ABNORMAL LOW (ref 13.0–17.0)
Hemoglobin: 7.5 g/dL — ABNORMAL LOW (ref 13.0–17.0)
Hemoglobin: 7.5 g/dL — ABNORMAL LOW (ref 13.0–17.0)
Hemoglobin: 8.2 g/dL — ABNORMAL LOW (ref 13.0–17.0)
O2 Saturation: 89 %
O2 Saturation: 91 %
O2 Saturation: 91 %
O2 Saturation: 86 %
O2 Saturation: 87 %
O2 Saturation: 89 %
O2 Saturation: 88 %
O2 Saturation: 90 %
Patient temperature: 37.2
Patient temperature: 37.2
Patient temperature: 36.7
Patient temperature: 36.9
Patient temperature: 37
Patient temperature: 37.2
Patient temperature: 37.3
Patient temperature: 37.2
Potassium: 3.3 mmol/L — ABNORMAL LOW (ref 3.5–5.1)
Potassium: 3.5 mmol/L (ref 3.5–5.1)
Potassium: 3.2 mmol/L — ABNORMAL LOW (ref 3.5–5.1)
Potassium: 2.9 mmol/L — ABNORMAL LOW (ref 3.5–5.1)
Potassium: 3.5 mmol/L (ref 3.5–5.1)
Potassium: 3.8 mmol/L (ref 3.5–5.1)
Potassium: 4 mmol/L (ref 3.5–5.1)
Potassium: 4.5 mmol/L (ref 3.5–5.1)
Sodium: 138 mmol/L (ref 135–145)
Sodium: 136 mmol/L (ref 135–145)
Sodium: 135 mmol/L (ref 135–145)
Sodium: 137 mmol/L (ref 135–145)
Sodium: 137 mmol/L (ref 135–145)
Sodium: 137 mmol/L (ref 135–145)
Sodium: 136 mmol/L (ref 135–145)
Sodium: 137 mmol/L (ref 135–145)
TCO2: 30 mmol/L (ref 22–32)
TCO2: 30 mmol/L (ref 22–32)
TCO2: 25 mmol/L (ref 22–32)
TCO2: 28 mmol/L (ref 22–32)
TCO2: 30 mmol/L (ref 22–32)
TCO2: 31 mmol/L (ref 22–32)
TCO2: 33 mmol/L — ABNORMAL HIGH (ref 22–32)
TCO2: 34 mmol/L — ABNORMAL HIGH (ref 22–32)
pCO2 arterial: 43.9 mmHg (ref 32–48)
pCO2 arterial: 42.3 mmHg (ref 32–48)
pCO2 arterial: 45.9 mmHg (ref 32–48)
pCO2 arterial: 41.9 mmHg (ref 32–48)
pCO2 arterial: 42.2 mmHg (ref 32–48)
pCO2 arterial: 41.6 mmHg (ref 32–48)
pCO2 arterial: 48.6 mmHg — ABNORMAL HIGH (ref 32–48)
pCO2 arterial: 53.7 mmHg — ABNORMAL HIGH (ref 32–48)
pH, Arterial: 7.43 (ref 7.35–7.45)
pH, Arterial: 7.446 (ref 7.35–7.45)
pH, Arterial: 7.315 — ABNORMAL LOW (ref 7.35–7.45)
pH, Arterial: 7.415 (ref 7.35–7.45)
pH, Arterial: 7.447 (ref 7.35–7.45)
pH, Arterial: 7.47 — ABNORMAL HIGH (ref 7.35–7.45)
pH, Arterial: 7.424 (ref 7.35–7.45)
pH, Arterial: 7.384 (ref 7.35–7.45)
pO2, Arterial: 56 mmHg — ABNORMAL LOW (ref 83–108)
pO2, Arterial: 60 mmHg — ABNORMAL LOW (ref 83–108)
pO2, Arterial: 67 mmHg — ABNORMAL LOW (ref 83–108)
pO2, Arterial: 51 mmHg — ABNORMAL LOW (ref 83–108)
pO2, Arterial: 52 mmHg — ABNORMAL LOW (ref 83–108)
pO2, Arterial: 53 mmHg — ABNORMAL LOW (ref 83–108)
pO2, Arterial: 55 mmHg — ABNORMAL LOW (ref 83–108)
pO2, Arterial: 62 mmHg — ABNORMAL LOW (ref 83–108)

## 2024-06-01 LAB — HEPATIC FUNCTION PANEL
ALT: 32 U/L (ref 0–44)
AST: 47 U/L — ABNORMAL HIGH (ref 15–41)
Albumin: 1.8 g/dL — ABNORMAL LOW (ref 3.5–5.0)
Alkaline Phosphatase: 140 U/L — ABNORMAL HIGH (ref 38–126)
Bilirubin, Direct: 0.4 mg/dL — ABNORMAL HIGH (ref 0.0–0.2)
Indirect Bilirubin: 0.5 mg/dL (ref 0.3–0.9)
Total Bilirubin: 0.9 mg/dL (ref 0.0–1.2)
Total Protein: 5.5 g/dL — ABNORMAL LOW (ref 6.5–8.1)

## 2024-06-01 LAB — GLUCOSE, CAPILLARY
Glucose-Capillary: 143 mg/dL — ABNORMAL HIGH (ref 70–99)
Glucose-Capillary: 144 mg/dL — ABNORMAL HIGH (ref 70–99)
Glucose-Capillary: 151 mg/dL — ABNORMAL HIGH (ref 70–99)
Glucose-Capillary: 152 mg/dL — ABNORMAL HIGH (ref 70–99)
Glucose-Capillary: 162 mg/dL — ABNORMAL HIGH (ref 70–99)
Glucose-Capillary: 172 mg/dL — ABNORMAL HIGH (ref 70–99)

## 2024-06-01 LAB — CBC WITH DIFFERENTIAL/PLATELET
Abs Immature Granulocytes: 0.36 K/uL — ABNORMAL HIGH (ref 0.00–0.07)
Basophils Absolute: 0.1 K/uL (ref 0.0–0.1)
Basophils Relative: 0 %
Eosinophils Absolute: 0.9 K/uL — ABNORMAL HIGH (ref 0.0–0.5)
Eosinophils Relative: 8 %
HCT: 23.3 % — ABNORMAL LOW (ref 39.0–52.0)
Hemoglobin: 7.8 g/dL — ABNORMAL LOW (ref 13.0–17.0)
Immature Granulocytes: 3 %
Lymphocytes Relative: 16 %
Lymphs Abs: 1.8 K/uL (ref 0.7–4.0)
MCH: 33.2 pg (ref 26.0–34.0)
MCHC: 33.5 g/dL (ref 30.0–36.0)
MCV: 99.1 fL (ref 80.0–100.0)
Monocytes Absolute: 0.6 K/uL (ref 0.1–1.0)
Monocytes Relative: 5 %
Neutro Abs: 7.8 K/uL — ABNORMAL HIGH (ref 1.7–7.7)
Neutrophils Relative %: 68 %
Platelets: 145 K/uL — ABNORMAL LOW (ref 150–400)
RBC: 2.35 MIL/uL — ABNORMAL LOW (ref 4.22–5.81)
RDW: 17.3 % — ABNORMAL HIGH (ref 11.5–15.5)
WBC: 11.4 K/uL — ABNORMAL HIGH (ref 4.0–10.5)
nRBC: 2.4 % — ABNORMAL HIGH (ref 0.0–0.2)

## 2024-06-01 LAB — BASIC METABOLIC PANEL WITH GFR
Anion gap: 13 (ref 5–15)
Anion gap: 12 (ref 5–15)
BUN: 37 mg/dL — ABNORMAL HIGH (ref 8–23)
BUN: 38 mg/dL — ABNORMAL HIGH (ref 8–23)
CO2: 25 mmol/L (ref 22–32)
CO2: 29 mmol/L (ref 22–32)
Calcium: 8.4 mg/dL — ABNORMAL LOW (ref 8.9–10.3)
Calcium: 7.9 mg/dL — ABNORMAL LOW (ref 8.9–10.3)
Chloride: 98 mmol/L (ref 98–111)
Chloride: 96 mmol/L — ABNORMAL LOW (ref 98–111)
Creatinine, Ser: 0.7 mg/dL (ref 0.61–1.24)
Creatinine, Ser: 0.73 mg/dL (ref 0.61–1.24)
GFR, Estimated: >60 mL/min (ref >60)
GFR, Estimated: >60 mL/min (ref >60)
Glucose, Bld: 149 mg/dL — ABNORMAL HIGH (ref 70–99)
Glucose, Bld: 139 mg/dL — ABNORMAL HIGH (ref 70–99)
Potassium: 3.3 mmol/L — ABNORMAL LOW (ref 3.5–5.1)
Potassium: 3.9 mmol/L (ref 3.5–5.1)
Sodium: 136 mmol/L (ref 135–145)
Sodium: 137 mmol/L (ref 135–145)

## 2024-06-01 LAB — CBC
HCT: 21.6 % — ABNORMAL LOW (ref 39.0–52.0)
HCT: 24.7 % — ABNORMAL LOW (ref 39.0–52.0)
Hemoglobin: 6.9 g/dL — CL (ref 13.0–17.0)
Hemoglobin: 8.3 g/dL — ABNORMAL LOW (ref 13.0–17.0)
MCH: 32.4 pg (ref 26.0–34.0)
MCH: 32 pg (ref 26.0–34.0)
MCHC: 31.9 g/dL (ref 30.0–36.0)
MCHC: 33.6 g/dL (ref 30.0–36.0)
MCV: 101.4 fL — ABNORMAL HIGH (ref 80.0–100.0)
MCV: 95.4 fL (ref 80.0–100.0)
Platelets: 133 K/uL — ABNORMAL LOW (ref 150–400)
Platelets: 161 K/uL (ref 150–400)
RBC: 2.13 MIL/uL — ABNORMAL LOW (ref 4.22–5.81)
RBC: 2.59 MIL/uL — ABNORMAL LOW (ref 4.22–5.81)
RDW: 17.8 % — ABNORMAL HIGH (ref 11.5–15.5)
RDW: 18.3 % — ABNORMAL HIGH (ref 11.5–15.5)
WBC: 11.1 K/uL — ABNORMAL HIGH (ref 4.0–10.5)
WBC: 17.5 K/uL — ABNORMAL HIGH (ref 4.0–10.5)
nRBC: 4.7 % — ABNORMAL HIGH (ref 0.0–0.2)
nRBC: 2.1 % — ABNORMAL HIGH (ref 0.0–0.2)

## 2024-06-01 LAB — YEAST SUSCEPTIBILITIES
Amphotericin B MIC: 1
Fluconazole Islt MIC: 2
ISAVUCONAZOLE MIC: 0.015
Itraconazole MIC: 0.06
Posaconazole MIC: 0.06
Voriconazole MIC: 0.03

## 2024-06-01 LAB — PHOSPHORUS
Phosphorus: 5.1 mg/dL — ABNORMAL HIGH (ref 2.5–4.6)
Phosphorus: 5.7 mg/dL — ABNORMAL HIGH (ref 2.5–4.6)

## 2024-06-01 LAB — PREPARE RBC (CROSSMATCH)

## 2024-06-01 LAB — MAGNESIUM
Magnesium: 1.8 mg/dL (ref 1.7–2.4)
Magnesium: 2 mg/dL (ref 1.7–2.4)

## 2024-06-01 LAB — LACTATE DEHYDROGENASE: LDH: 557 U/L — ABNORMAL HIGH (ref 98–192)

## 2024-06-01 LAB — FIBRINOGEN: Fibrinogen: 632 mg/dL — ABNORMAL HIGH (ref 210–475)

## 2024-06-01 LAB — APTT
aPTT: 71 s — ABNORMAL HIGH (ref 24–36)
aPTT: 79 s — ABNORMAL HIGH (ref 24–36)

## 2024-06-01 LAB — PROTIME-INR
INR: 1.4 — ABNORMAL HIGH (ref 0.8–1.2)
Prothrombin Time: 17.7 s — ABNORMAL HIGH (ref 11.4–15.2)

## 2024-06-01 MED ORDER — STERILE WATER FOR INJECTION IJ SOLN
INTRAMUSCULAR | Status: AC
  Administered 2024-06-01: 10 mL
  Filled 2024-06-01: qty 10

## 2024-06-01 MED ORDER — MAGNESIUM SULFATE 2 GM/50ML IV SOLN
2.0000 g | Freq: Once | INTRAVENOUS | Status: AC
  Administered 2024-06-01: 2 g via INTRAVENOUS
  Filled 2024-06-01: qty 50

## 2024-06-01 MED ORDER — POTASSIUM CHLORIDE 20 MEQ PO PACK
40.0000 meq | PACK | Freq: Once | ORAL | Status: AC
  Administered 2024-06-01: 40 meq
  Filled 2024-06-01: qty 2

## 2024-06-01 MED ORDER — STERILE WATER FOR INJECTION IJ SOLN
INTRAMUSCULAR | Status: AC
  Administered 2024-06-01: 2.1 mL
  Filled 2024-06-01: qty 10

## 2024-06-01 MED ORDER — FREE WATER
200.0000 mL | Status: DC
  Administered 2024-06-01: 200 mL

## 2024-06-01 MED ORDER — SODIUM CHLORIDE 0.9% IV SOLUTION: Freq: Once | INTRAVENOUS | Status: DC

## 2024-06-01 MED ORDER — POTASSIUM CHLORIDE 20 MEQ PO PACK
40.0000 meq | PACK | ORAL | Status: AC
  Administered 2024-06-01 (×2): 40 meq
  Filled 2024-06-01 (×2): qty 2

## 2024-06-01 MED ORDER — FREE WATER
100.0000 mL | Status: DC
  Administered 2024-06-01 – 2024-06-04 (×19): 100 mL

## 2024-06-01 MED ORDER — POTASSIUM CHLORIDE 20 MEQ PO PACK
40.0000 meq | PACK | Freq: Two times a day (BID) | ORAL | Status: DC
  Administered 2024-06-01 – 2024-06-04 (×7): 40 meq
  Filled 2024-06-01 (×7): qty 2

## 2024-06-01 MED ORDER — VANCOMYCIN HCL IN DEXTROSE 1-5 GM/200ML-% IV SOLN
1000.0000 mg | Freq: Once | INTRAVENOUS | Status: AC
  Administered 2024-06-01: 1000 mg via INTRAVENOUS

## 2024-06-01 MED ORDER — HEPARIN SODIUM (PORCINE) 5000 UNIT/ML IJ SOLN
5000.0000 [IU] | Freq: Once | INTRAMUSCULAR | Status: AC
  Administered 2024-06-01: 5000 [IU] via INTRAVENOUS

## 2024-06-01 NOTE — Progress Notes (Addendum)
 Patient ID: Keith Gibbs, male   DOB: 03-30-1961, 63 y.o.   MRN: [id number] Event note:  Air entrained into venous side of ECMO circuit and crossed to arterial side before clamping out.  VV ECMO clamped out at 9:48 am, patient remained hemodynamically stable though oxygen saturation dropped to 30s.  ECMO circuit changed out with wet-wet connections and heparin  bolus. Successful reinitiation of VV ECMO at 10:08 am.  CRITICAL CARE Performed by: [name]  Total critical care time: 60 minutes  Critical care time was exclusive of separately billable procedures and treating other patients.  Critical care was necessary to treat or prevent imminent or life-threatening deterioration.  Critical care was time spent personally by me on the following activities: development of treatment plan with patient and/or surrogate as well as nursing, discussions with consultants, evaluation of patient's response to treatment, examination of patient, obtaining history from patient or surrogate, ordering and performing treatments and interventions, ordering and review of laboratory studies, ordering and review of radiographic studies, pulse oximetry and re-evaluation of patient's condition.  [name] 06/01/2024 10:19 AM

## 2024-06-01 NOTE — Progress Notes (Signed)
 RT note, RT at bedside during event.

## 2024-06-01 NOTE — Progress Notes (Signed)
 Clamped due to large air bubbles in arterial limb at 9:48 Back on ECMO 10:08   Circuit changed after significant air suctioned out by backflowing from cannulas at most proximal connectors. All air bubbles removed.  Heparin  5000 units given  One time doses of antibiotics given Sedation boluses-- versed  & dilaudid  given per MAR. Full vent settings in PRVC during clamp out-- very high peak pressures and pressure regulated. Vent back to rest settings  Leita SHAUNNA Gaskins, DO 06/01/24 10:41 AM [facility]  For contact information, see Amion. If no response to pager, please call PCCM consult pager. After hours, 7PM- 7AM, please call Elink.

## 2024-06-01 NOTE — Progress Notes (Addendum)
 [address] [employer] [address]             [phone]      5 Days Post-Op Procedure(s) (LRB): CREATION, TRACHEOSTOMY (N/A) Subjective: Patient sedated, circuit exchange this AM  Objective: Vital signs in last 24 hours: Temp:  [98.1 F (36.7 C)-99.1 F (37.3 C)] 98.6 F (37 C) (11/08 1245) Pulse Rate:  [43-96] 70 (11/08 1245) Cardiac Rhythm: Sinus bradycardia (11/08 0800) Resp:  [0-35] 9 (11/08 1245) BP: (83-121)/(47-71) 98/62 (11/08 1200) SpO2:  [31 %-96 %] 85 % (11/08 1245) Arterial Line BP: (82-211)/(38-76) 112/52 (11/08 1245) FiO2 (%):  [60 %-70 %] 70 % (11/08 0730) Weight:  [880 kg] 119 kg (11/08 0500)  Hemodynamic parameters for last 24 hours: CVP:  [4 mmHg] 4 mmHg  Intake/Output from previous day: 11/07 0701 - 11/08 0700 In: 4505.6 [I.V.:2085.4; NG/GT:2310; IV Piggyback:110.2] Out: 8690 [Urine:8380; Stool:310] Intake/Output this shift: Total I/O In: 1567.6 [I.V.:434.8; NG/GT:926.1; IV Piggyback:206.7] Out: 1250 [Urine:1250]  General appearance: critically ill, sedated Neurologic: sedated Heart: regular rate and rhythm, S1, S2 normal, no murmur, click, rub or gallop Lungs: diminished right sided breath sounds and left basilar breath sounds Abdomen: soft, non-tender; bowel sounds normal; no masses,  no organomegaly Extremities: edema 1+ BLE Wound: No sign of infection  Lab Results: Recent Labs    06/01/24 0347 06/01/24 0459 06/01/24 1047 06/01/24 1053 06/01/24 1148  WBC 11.4*  --  11.1*  --   --   HGB 7.8*   < > 6.9* 11.2* 8.5*  HCT 23.3*   < > 21.6* 33.0* 25.0*  PLT 145*  --  133*  --   --    < > = values in this interval not displayed.   BMET:  Recent Labs    05/31/24 1540 05/31/24 1548 06/01/24 0347 06/01/24 0459 06/01/24 1053 06/01/24 1148  NA 138   < > 136   < > 135 137  K 4.2   < > 3.3*   < > 3.2* 2.9*  CL 105  --  98  --   --   --   CO2 23  --  25  --   --   --   GLUCOSE 160*  --  149*  --   --   --    BUN 36*  --  37*  --   --   --   CREATININE 0.72  --  0.70  --   --   --   CALCIUM  7.8*  --  8.4*  --   --   --    < > = values in this interval not displayed.    PT/INR:  Recent Labs    06/01/24 0347  LABPROT 17.7*  INR 1.4*   ABG    Component Value Date/Time   PHART 7.415 06/01/2024 1148   HCO3 26.9 06/01/2024 1148   TCO2 28 06/01/2024 1148   ACIDBASEDEF 3.0 (H) 06/01/2024 1053   O2SAT 86 06/01/2024 1148   CBG (last 3)  Recent Labs    06/01/24 0351 06/01/24 0745 06/01/24 1144  GLUCAP 152* 143* 162*    Assessment/Plan: S/P Procedure(s) (LRB): CREATION, TRACHEOSTOMY (N/A)  Neuro: Sedated  CV: VV ECMO, circuit exchange this AM due to air entrainment. On Levo 8 and Vaso 0.04. CVP 10/11 this AM. MAP 74 this AM.   Pulm: ARDS. Patient desaturated into the 30s during circuit exchange.  Trach in place. CT with  no output, on water  seal. No air leak. CXR with stable right apical space.  GI: Tolerating tube feeds  Endo: CBGs controlled on SSI.   Renal: Cr 0.7. UO 8380cc/24hrs. On Lasix drip  ID: Candida glabrata pneumonia, on Micafungin. Completed Meropenem. Leukocytosis 11.1, stable.   Expected postop ABLA: H/H 6.9/21.6, transfused 1U PRBCs this AM.   DVT Prophylaxis: On Bivalirudin  for ECMO  Dispo: Continue ICU care   LOS: 23 days    Con GORMAN [place], PA-C 06/01/2024

## 2024-06-01 NOTE — Progress Notes (Signed)
 Patient ID: Keith Gibbs, male   DOB: 02/13/1961, 63 y.o.   MRN: [id number]    Advanced Heart Failure Rounding Note   Subjective:    Placed on VV ECMO 05/22/2024  Currently off NE, on vasopressin  0.04.  MAP stable.   On Lasix gtt 8 mg/hr, I/Os net negative 4184.  CVP around 10 today.   VV ECMO Speed 3900 rpm Flow 4.4 L/min Pven -100 DeltaP 25 Sweep 9 7.45/42/60 LDH 557  Bivalirudin  PTT 70-90 goal  iNO ppm into oxygenator for clotting  Objective:   Weight Range:  Vital Signs:   Temp:  [98.6 F (37 C)-99.1 F (37.3 C)] 99 F (37.2 C) (11/08 0850) Pulse Rate:  [43-76] 45 (11/08 0850) Resp:  [0-20] 9 (11/08 0850) BP: (83-121)/(47-71) 83/51 (11/08 0800) SpO2:  [77 %-96 %] 92 % (11/08 0850) Arterial Line BP: (82-159)/(23-63) 120/51 (11/08 0850) FiO2 (%):  [60 %-70 %] 70 % (11/08 0730) Weight:  [880 kg] 119 kg (11/08 0500) Last BM Date : 06/01/24  Weight change: Filed Weights   05/30/24 0500 05/31/24 0500 06/01/24 0500  Weight: 113.7 kg 117.7 kg 119 kg    Intake/Output:   Intake/Output Summary (Last 24 hours) at 06/01/2024 0940 Last data filed at 06/01/2024 0911 Gross per 24 hour  Intake 4650.95 ml  Output 9040 ml  Net -4389.05 ml     Physical Exam: General: Sedated on vent Neck: JVP 10 cm, no thyromegaly or thyroid  nodule.  Lungs: Clear to auscultation bilaterally with normal respiratory effort. CV: Nondisplaced PMI.  Heart regular S1/S2, no S3/S4, no murmur.  1+ edema to thighs  Abdomen: Soft, nontender, no hepatosplenomegaly, no distention.  Skin: Intact without lesions or rashes.  Neurologic: Sedated Extremities: No clubbing or cyanosis.  HEENT: Normal.   Labs: Basic Metabolic Panel: Recent Labs  Lab 05/29/24 0445 05/29/24 1451 05/30/24 0420 05/30/24 1205 05/30/24 1550 05/30/24 1824 05/30/24 2316 05/31/24 0427 05/31/24 0437 05/31/24 1540 05/31/24 1548 05/31/24 1828 05/31/24 2314 06/01/24 0347 06/01/24 0459 06/01/24 0749  NA 151*    < > 143  146*   < > 142   < >  --  138   < > 138   < > 139 138 136 138 136  K 3.7   < > 4.3  4.4   < > 4.2   < >  --  4.2   < > 4.2   < > 4.0 3.9 3.3* 3.3* 3.5  CL 117*   < > 111  --  112*  --   --  107  --  105  --   --   --  98  --   --   CO2 25   < > 22  --  23  --   --  22  --  23  --   --   --  25  --   --   GLUCOSE 145*   < > 164*  --  145*  --   --  151*  --  160*  --   --   --  149*  --   --   BUN 42*   < > 38*  --  37*  --   --  36*  --  36*  --   --   --  37*  --   --   CREATININE 0.70   < > 0.77  --  0.57*  --   --  0.57*  --  0.72  --   --   --  0.70  --   --   CALCIUM  7.9*   < > 8.0*  --  7.3*  --   --  7.8*  --  7.8*  --   --   --  8.4*  --   --   MG 2.2  --  2.2  --   --   --  1.9 2.0  --   --   --   --   --  1.8  --   --   PHOS 4.3  --  4.9*  --   --   --  3.9 4.4  --   --   --   --   --  5.1*  --   --    < > = values in this interval not displayed.    Liver Function Tests: Recent Labs  Lab 05/28/24 0420 05/29/24 0445 05/30/24 0420 05/31/24 0427 06/01/24 0347  AST 78* 48* 35 35 47*  ALT 85* 60* 46* 35 32  ALKPHOS 180* 166* 166* 145* 140*  BILITOT 2.8* 1.7* 1.2 1.0 0.9  PROT 5.0* 5.0* 5.4* 5.4* 5.5*  ALBUMIN  1.8* 1.7* 2.0* 1.8* 1.8*   No results for input(s): LIPASE, AMYLASE in the last 168 hours. No results for input(s): AMMONIA in the last 168 hours.  CBC: Recent Labs  Lab 05/28/24 2355 05/29/24 0428 05/29/24 0445 05/29/24 1623 05/30/24 0420 05/30/24 1205 05/30/24 1550 05/30/24 1824 05/31/24 0427 05/31/24 0437 05/31/24 1540 05/31/24 1548 05/31/24 1828 05/31/24 2314 06/01/24 0347 06/01/24 0459 06/01/24 0749  WBC 13.0*  --  13.0*   < > 12.2*  --  11.0*  --  10.5  --  11.7*  --   --   --  11.4*  --   --   NEUTROABS 7.9*  --  7.4  --  7.6  --   --   --  6.5  --   --   --   --   --  7.8*  --   --   HGB 7.2*   < > 8.2*   < > 8.0*  7.8*   < > 7.5*   < > 7.6*   < > 7.7*   < > 7.5* 7.8* 7.8* 7.5* 11.2*  HCT 23.6*   < > 25.6*   < > 25.3*  23.0*    < > 23.9*   < > 23.8*   < > 23.8*   < > 22.0* 23.0* 23.3* 22.0* 33.0*  MCV 105.8*  --  101.6*   < > 103.3*  --  102.1*  --  102.1*  --  101.7*  --   --   --  99.1  --   --   PLT 214  --  215   < > 188  --  171  --  164  --  147*  --   --   --  145*  --   --    < > = values in this interval not displayed.    Cardiac Enzymes: No results for input(s): CKTOTAL, CKMB, CKMBINDEX, TROPONINI in the last 168 hours.  BNP: BNP (last 3 results) Recent Labs    03/20/24 1419  BNP 20.6    ProBNP (last 3 results) No results for input(s): PROBNP in the last 8760 hours.      Medications:     Scheduled Medications:  arformoterol  15 mcg Nebulization BID   artificial tears  1 Application Both Eyes Q8H   Chlorhexidine  Gluconate Cloth  6 each Topical Daily   clonazePAM  1 mg Oral Q8H   feeding supplement (PROSource TF20)  60 mL Per Tube QID   fiber supplement (BANATROL TF)  60 mL Per Tube BID   free water   150 mL Per Tube Q4H   gabapentin   600 mg Oral Daily   And   gabapentin   1,200 mg Oral QHS   insulin aspart  0-15 Units Subcutaneous Q4H   OLANZapine   5 mg Intravenous Q8H   mouth rinse  15 mL Mouth Rinse Q2H   oxyCODONE   10 mg Oral QID   pantoprazole  (PROTONIX ) IV  40 mg Intravenous QHS   polyethylene glycol  17 g Oral Daily   potassium chloride   40 mEq Per Tube Q4H   revefenacin  175 mcg Nebulization Daily   senna-docusate  2 tablet Oral QHS   sodium chloride  flush  10-40 mL Intracatheter Q12H   sodium chloride  flush  10-40 mL Intracatheter Q12H    Infusions:  albumin  human Stopped (05/29/24 2308)   bivalirudin  (ANGIOMAX ) 250 mg in sodium chloride  0.9 % 500 mL (0.5 mg/mL) infusion 0.11 mg/kg/hr (06/01/24 0800)   feeding supplement (VITAL 1.5 CAL) 65 mL/hr at 06/01/24 0800   furosemide (LASIX) 200 mg in dextrose  5 % 100 mL (2 mg/mL) infusion 8 mg/hr (06/01/24 0800)   HYDROmorphone  5 mg/hr (06/01/24 0800)   micafungin (MYCAMINE) 200 mg in sodium chloride  0.9 % 100 mL  IVPB Stopped (05/31/24 1813)   midazolam  3 mg/hr (06/01/24 0800)   niCARDipine Stopped (05/24/24 2221)   norepinephrine (LEVOPHED) Adult infusion 4 mcg/min (06/01/24 0800)   propofol  (DIPRIVAN ) infusion 50 mcg/kg/min (06/01/24 0911)   vasopressin  0.04 Units/min (06/01/24 0921)    PRN Medications: albumin  human, hydrALAZINE , HYDROmorphone , hydrOXYzine , ipratropium-albuterol , lactulose, midazolam , ondansetron  (ZOFRAN ) IV, mouth rinse, mouth rinse, sodium chloride  flush, sodium chloride  flush   Assessment/Plan:   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 - s/p VV ECMO cannulation 05/22/24 - VV ECMO flows look good, significant fibrin buildup on previous oxygenator - S/p circuit change on 11/4, improvement in post gas from 151 to 540  - Bivalirudin  goal PTT of 70-80, discussed goals with pharmacy - Nitric oxide through the circuit for anticoagulation and long pump run - Continue to wean sedation slowly - TEE with cannula adjustment 11/3 - Completed course of meropenem on 11/7 - TV remain extremely low, suspect prolonged recovery - Start diuresis today, IV lasix gtt at 8 plus metolazone - CCM managing vent and R chest tube - Trach 11/3, appreciate ENT - Continue micafungin - Transfuse hgb < 7.5  Shock: - Primarily septic/vasodilatory.  Now off NE and on vasopressin  0.04.   Bradycardia: - Intermittent, continued sinus bradycardia. HR 50 bpm today.    RUL lung CA s/p lobectomy COPD with ongoing tobacco use Bipolar d/o CAD Candida glabrata fungemia/PNA - ID following - on micafungin for fungemia, ID following, course to be determined given indwelling lines - steroids off - Completed meropenem 11/7  CHF/HFpEF - Good diuresis yesterday with Lasix gtt 8 mg/hr.   - CVP 10, creatinine stable 0.7.  Continue Lasix gtt today.   Remains critically ill with ARDS/fungal PNA/PTX on top of severe underlying lung disease. Long run expected  CRITICAL  CARE Performed by: [name]   Total critical care time: 45 minutes  Critical care time was exclusive of separately billable procedures and treating other patients.  Critical care was necessary to treat or prevent imminent or life-threatening deterioration.  Critical care  was time spent personally by me on the following activities: development of treatment plan with patient and/or surrogate as well as nursing, discussions with consultants, evaluation of patient's response to treatment, examination of patient, obtaining history from patient or surrogate, ordering and performing treatments and interventions, ordering and review of laboratory studies, ordering and review of radiographic studies, pulse oximetry and re-evaluation of patient's condition.  [name] 06/01/2024 9:45 AM

## 2024-06-01 NOTE — Progress Notes (Signed)
   [address], [place] [address]             [phone]   No new evets since earlier today  BP (!) 91/58   Pulse 62   Temp 99 F (37.2 C)   Resp 16   Ht 6' 3 (1.905 m)   Wt 119 kg   SpO2 (!) 85%   BMI 32.79 kg/m  Norepi 19, vasopressin  0.04 7.42/49/55/+7  Intake/Output Summary (Last 24 hours) at 06/01/2024 1819 Last data filed at 06/01/2024 1800 Gross per 24 hour  Intake 5654.25 ml  Output 7920 ml  Net -2265.75 ml    K 4.0 Hct 22  Continue current care  Malaney Mcbean C. Kerrin, MD [facility] [phone]

## 2024-06-01 NOTE — Progress Notes (Signed)
 PHARMACY - ANTICOAGULATION CONSULT NOTE  Pharmacy Consult for heparin  >> bivalirudin  Indication: VTE, VV ECMO (10/29 - current)  Allergies  Allergen Reactions   Asenapine Other (See Comments) and Nausea And Vomiting    Increased tremors   Dextromethorphan Hbr    Guaifenesin     Latuda [Lurasidone Hcl] Other (See Comments)    Tremors     Lurasidone Other (See Comments) and Hives    Other Reaction(s): Angioedema   Phenylephrine      Patient Measurements: Height: 6' 3 (190.5 cm) Weight: 119 kg (262 lb 5.6 oz) IBW/kg (Calculated) : 84.5  Vital Signs: Temp: 98.2 F (36.8 C) (11/08 1101) Temp Source: Bladder (11/08 0000) BP: 83/51 (11/08 0800) Pulse Rate: 70 (11/08 1101)  Labs: Recent Labs    05/30/24 0420 05/30/24 1200 05/31/24 0427 05/31/24 0437 05/31/24 1540 05/31/24 1548 06/01/24 0347 06/01/24 0459 06/01/24 0749  HGB 8.0*  7.8*   < > 7.6*   < > 7.7*   < > 7.8* 7.5* 11.2*  HCT 25.3*  23.0*   < > 23.8*   < > 23.8*   < > 23.3* 22.0* 33.0*  PLT 188   < > 164  --  147*  --  145*  --   --   APTT 60*   < > 71*  --  73*  --  71*  --   --   LABPROT 17.1*  --  17.3*  --   --   --  17.7*  --   --   INR 1.3*  --  1.3*  --   --   --  1.4*  --   --   CREATININE 0.77   < > 0.57*  --  0.72  --  0.70  --   --    < > = values in this interval not displayed.    Estimated Creatinine Clearance: 131.4 mL/min (by C-G formula based on SCr of 0.7 mg/dL).   Assessment: 17 yoM admitted for RUL lobectomy.  Pt intubated, now having difficulty with ventilation. Placed on VV ECMO 10/29 in the evening. Pharmacy to dose IV bivalirudin .   Previously on therapeutic heparin  (HL 0.33 on 1700/h) for acute DVT of gastrocnemius vein  > changed to bivalirudin  with ECMO cannulation  S/p circuit change 11/4 S/p circuit exchange 11/8  Bivalirudin  0.11mg /kg/hr with aPTT 71 sec is therapeutic for new goal. No bleeding noted. Cbc stable LDH stable fibrinogen stable   Goal of Therapy:  aPTT goal  increased to 70-80 sec on 11/7 Monitor platelets by anticoagulation protocol: Yes   Plan:  Continue bivalirudin  0.11 mg/kg/hr Q12h aPTT and CBC   Olam Chalk Pharm.D. CPP, BCPS Clinical Pharmacist [phone] 06/01/2024 11:08 AM

## 2024-06-01 NOTE — Progress Notes (Signed)
 Palliative Medicine Progress Note   Patient Name: Keith Gibbs       Date: 06/01/2024 DOB: January 07, 1961  Age: 63 y.o. MRN#: [id number] Attending Physician: Keith Elspeth BROCKS, MD Primary Care Physician: Keith Greig PARAS, NP Admit Date: 05/09/2024   HPI/Patient Profile: 63 y.o. male  with past medical history of COPD, former tobacco use, CAD, hepatitis C, HTN, HLD, bipolar 1 disorder, and schizophrenia.  He was admitted on 05/09/2024 for lobectomy of RUL nodule.  He was initially progressing as expected postoperatively, however on 10/21 he decompensated with worsening hypoxemia.  He ultimately went into ARDS and was cannulated for VV ECMO on 10/29.   Palliative Medicine has been consulted for goals of care discussions and complex medical decision making.   Subjective: No events overnight. Off levophed.   Family Meeting: Providers: Dr. Kerrin, Dr. Gretta, Dr. Zenaida, and myself Family: mother and sisters Keith Gibbs and Keith Gibbs)  Today we discussed goals of care for [name]. Reviewed his hospital course and current clinical condition. Reviewed his issues of acute respiratory failure/ARDS and fungal pneumonia. Discussed that he expected to recover, but that his clinical course is expected to be long, and will require rehab and LTACH. Discussed that he does not have irreversible lung injury at this point.   Family reports that [name] was very active prior to admission, and they are hopeful he can return to his previous quality of life. They express that if his condition ever becomes irreversible, the want the medical team to let them know.   We discussed code status, and family agrees to DNR in the event of cardiac arrest but otherwise continuing current care.    Objective:  Physical Exam Vitals  reviewed.  Constitutional:      General: He is not in acute distress.    Appearance: He is ill-appearing.     Interventions: He is sedated.  Cardiovascular:     Rate and Rhythm: Bradycardia present.  Pulmonary:     Comments: Trach/vent           Palliative Medicine Assessment & Plan   Assessment: Principal Problem:   S/P lobectomy of lung Active Problems:   Status post robot-assisted surgical procedure   Acute hypoxic respiratory failure (HCC)   Anxiety disorder due to medical condition   Malnutrition of moderate degree   Acute respiratory  distress syndrome (ARDS) due to COVID-19 virus (HCC)   Candidemia (HCC)   Central line infection   Pseudomonas aeruginosa infection   Ventilator associated pneumonia [facility])   Patient receiving extracorporeal membrane oxygenation (ECMO)    Recommendations/Plan: Code status changed to DNR with interventions Continue current care PMT will continue to follow   Prognosis:  Unable to determine  Discharge Planning: To Be Determined   Thank you for allowing the Palliative Medicine Team to assist in the care of this patient.   Time: 38 minutes   Keith KATHEE Loll, NP   Please contact Palliative Medicine Team phone at [phone] for questions and concerns.  For individual providers, please see AMION.

## 2024-06-01 NOTE — Progress Notes (Signed)
 ECMO Circuit Change  Due to massive air entrainment, the decision was made by the multidisciplinary team to change out the ECMO circuit. A new circuit was set up and primed using sterile technique and brought to the patient bedside by Ole Piety, RN/ES. The following staff were present for the procedure: Ole Piety RN/ES, Gaither Flint RN/ES, Izetta Hint RN/ES, Cleopatra Silversmith RN, Janell Bars RN, Carlyon Gaskins DO, [name] MD & Maryelizabeth Sprang RT.  The field was prepped and draped, a timeout was performed, and the team completed a briefing prior to the start of the procedure. 5000 units of Heparin  were given per pharmacy. The ECMO circuit was clamped out at 0948, cut and reconnected to the new circuit via a wet-to-wet connection. Prolonged off-pump time necessary to remove air directly from both venous & return cannulas prior to reconnection to the new circuit. Flow was reinitiated at 1008 and 3200 rpms were resumed for a flow of 4.5 LPM.  Disposable Lot Numbers: [id number] Oxygenator Lot #: [id number] Tubing Lot #: [id number] Cardiohelp Console #: [id number]

## 2024-06-01 NOTE — Progress Notes (Signed)
 NAME:  OLLIVER BOYADJIAN, MRN:  [id number], DOB:  Jul 09, 1961, LOS: 23 ADMISSION DATE:  05/09/2024, CONSULTATION DATE: 05/14/2024 REFERRING MD: CTS, CHIEF COMPLAINT: Acute on chronic hypoxic respiratory failure  History of Present Illness:  63 yo male presented for lobectomy with CTS. Progressing as expected post operatively with CT in place and was on 4E when this morning he decompensated with worsening hypoxemia and increased wob. CTS transferred to ICU and requested CCM evaluation.   Patient went into ARDS and eventually cannulated for VV ECMO.  Pertinent  Medical History  RUL nodule s/p RUL lobectomy Bipolar 1 d/o Schizophrenia Polysubstance abuse Cad Mild to mod Ao insuff Htn Hyperlipidemia Tobacco use COPD Hep C  Significant Hospital Events: Including procedures, antibiotic start and stop dates in addition to other pertinent events   Admitted 10/16 for RUL Lobectomy Transferred to ICU 10/21  10/24 started zosyn 10/26 - Intubated.  He was difficult to oxygenate and ventilate therefore APRV started.  Started meropenem, azithromycin , cefepime, & vanc 10/27 con't vanc & mero 10/29 -nitric oxide weaned off overnight-did not change his respiratory status.  Proned. 10/29 -worsened despite proning > cannulated for VV ECMO.  Started micafungin empirically, con't previous abd- vanc & mero 10/30 -blood cultures from 05/19/2024 and BAL cultures from 05/19/2024 grew Candida glabrata 10/31 - 11/2 unable to get the patient off paralytics  11/3 tracheostomy (ENT), weaned off nimbex, TEE with repositioning of crescent cannula 11/4 weaned off ketamine , circuit exchanged at beside for failing oxygenator, increasing vasopressor requirements and new bradycardia 11/8 air entrainment, emergency circuit exchange  Interim History / Subjective:  Overnight no acute events.  This morning had air entrainment event requiring circuit exchange.    Objective    Blood pressure (!) 83/51, pulse (!) 45,  temperature 99 F (37.2 C), resp. rate (!) 9, height 6' 3 (1.905 m), weight 119 kg, SpO2 92%. CVP:  [4 mmHg] 4 mmHg  Vent Mode: PCV FiO2 (%):  [60 %-70 %] 70 % Set Rate:  [15 bmp] 15 bmp PEEP:  [10 cmH20] 10 cmH20 Plateau Pressure:  [23 cmH20-25 cmH20] 24 cmH20   Intake/Output Summary (Last 24 hours) at 06/01/2024 1041 Last data filed at 06/01/2024 0911 Gross per 24 hour  Intake 4496.73 ml  Output 9040 ml  Net -4543.27 ml   Filed Weights   05/30/24 0500 05/31/24 0500 06/01/24 0500  Weight: 113.7 kg 117.7 kg 119 kg    Examination: General: critically ill appearing man lying in bed on vent, ECMO HEENT: [place]/AT, eyes anicteric Neck: trach small air leak Lungs: on rest vent settings, synchronous, minimal breath sounds Cardiovascular: S1S2, bradycardic, reg rhtyhm Abdomen: soft, NT Extremities: mild edema Neuro:  RASS -5, pinpoint pupils   I/O -4.2L, net -6.5L  Ventilator PC 15/15/10/60%, about 120cc Vt    VV ECMO Flow: 3900 RMP>  4.5 L  Sweep: 9 L/min   7.45/42/60/29 BUN 37 Cr 0.70 LDH 557  WBC 11.4 H/H 7.8/23.2 Platelet 145 PTT 71  CXR personally reviewed> better aeration in RLL, LUL, air bronchograms in LLL. Trach, RIJ ECMO cannula.   Resolved problem list  AKI  Assessment and Plan   Acute hypoxic and hypercapnic respiratory failure due to ARDS in the setting of candida glabrata and pseudomonas pneumonia s/p VV ECMO s/p tracheostomy 11/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 History of COPD -Con't VV EMCO circuit, bival, iNO through circuit -Circuit change today for air entrainment. Mother updated after this event. Will updated TCTS when available.  -goal flows >  4L -titrate sweel for normal pH -goal SpO2 >88% -daily CXR -micafungin -avoid steroids -PAD protocol- dilaudid , versed , & propofol . Con't oxycodone , olanzapine , clonazepam -con't lasix gtt -bronchodilators  Acute encephalopathy due to sedation History of anxiety, schizoaffective  disorder bipolar type  -con't sedation -olanzapine  -holding PTA cymbalta  and depakote   Sinus bradycardia> suspect this is related to loss of sympathetic effects of ketamine  Hypotension -monitor; avoiding precedex  -NE PRN to maintain MAP >65  Pseudomonas and candida glabrata pneumonia  Candida glabrata fungemia  -completed meropenem -micafungin per ID; needs to be a long course since ECMO cannula went in with fungemia before this was known/ antifungals started -eventually needs ophtho eval  Hypernatremia -free water  decreased to 100cc q4h -monitor  Hyperphosphatemia -trend, may need phos binder  Hypokalemia -enteral repletion  Elevated transaminases, improving  -monitor  RLE DVT  -bival, needs minimum 3 months  Anemia in the setting of critical illness and acute blood loss -transfuse 1 unit post-circuit change -repeat CBC   Malnutrition  -TF at goal  Deconditioning -expect a long rehab course  GI prophylaxis: PPI  DVT prophylaxis: bival infusion   Plan discussed during ECMO rounds and again after circuit exchange.     Labs   CBC: Recent Labs  Lab 05/28/24 2355 05/29/24 0428 05/29/24 0445 05/29/24 1623 05/30/24 0420 05/30/24 1205 05/30/24 1550 05/30/24 1824 05/31/24 0427 05/31/24 0437 05/31/24 1540 05/31/24 1548 05/31/24 1828 05/31/24 2314 06/01/24 0347 06/01/24 0459 06/01/24 0749  WBC 13.0*  --  13.0*   < > 12.2*  --  11.0*  --  10.5  --  11.7*  --   --   --  11.4*  --   --   NEUTROABS 7.9*  --  7.4  --  7.6  --   --   --  6.5  --   --   --   --   --  7.8*  --   --   HGB 7.2*   < > 8.2*   < > 8.0*  7.8*   < > 7.5*   < > 7.6*   < > 7.7*   < > 7.5* 7.8* 7.8* 7.5* 11.2*  HCT 23.6*   < > 25.6*   < > 25.3*  23.0*   < > 23.9*   < > 23.8*   < > 23.8*   < > 22.0* 23.0* 23.3* 22.0* 33.0*  MCV 105.8*  --  101.6*   < > 103.3*  --  102.1*  --  102.1*  --  101.7*  --   --   --  99.1  --   --   PLT 214  --  215   < > 188  --  171  --  164  --  147*  --    --   --  145*  --   --    < > = values in this interval not displayed.    Basic Metabolic Panel: Recent Labs  Lab 05/29/24 0445 05/29/24 1451 05/30/24 0420 05/30/24 1205 05/30/24 1550 05/30/24 1824 05/30/24 2316 05/31/24 0427 05/31/24 0437 05/31/24 1540 05/31/24 1548 05/31/24 1828 05/31/24 2314 06/01/24 0347 06/01/24 0459 06/01/24 0749  NA 151*   < > 143  146*   < > 142   < >  --  138   < > 138   < > 139 138 136 138 136  K 3.7   < > 4.3  4.4   < > 4.2   < >  --  4.2   < > 4.2   < > 4.0 3.9 3.3* 3.3* 3.5  CL 117*   < > 111  --  112*  --   --  107  --  105  --   --   --  98  --   --   CO2 25   < > 22  --  23  --   --  22  --  23  --   --   --  25  --   --   GLUCOSE 145*   < > 164*  --  145*  --   --  151*  --  160*  --   --   --  149*  --   --   BUN 42*   < > 38*  --  37*  --   --  36*  --  36*  --   --   --  37*  --   --   CREATININE 0.70   < > 0.77  --  0.57*  --   --  0.57*  --  0.72  --   --   --  0.70  --   --   CALCIUM  7.9*   < > 8.0*  --  7.3*  --   --  7.8*  --  7.8*  --   --   --  8.4*  --   --   MG 2.2  --  2.2  --   --   --  1.9 2.0  --   --   --   --   --  1.8  --   --   PHOS 4.3  --  4.9*  --   --   --  3.9 4.4  --   --   --   --   --  5.1*  --   --    < > = values in this interval not displayed.   GFR: Estimated Creatinine Clearance: 131.4 mL/min (by C-G formula based on SCr of 0.7 mg/dL). Recent Labs  Lab 05/28/24 0855 05/28/24 1522 05/28/24 1524 05/29/24 0656 05/29/24 1623 05/30/24 1550 05/31/24 0427 05/31/24 1540 05/31/24 1548 06/01/24 0347  WBC  --   --    < >  --    < > 11.0* 10.5 11.7*  --  11.4*  LATICACIDVEN 0.5 0.6  --  0.5  --   --   --   --  0.9  --    < > = values in this interval not displayed.    Liver Function Tests: Recent Labs  Lab 05/28/24 0420 05/29/24 0445 05/30/24 0420 05/31/24 0427 06/01/24 0347  AST 78* 48* 35 35 47*  ALT 85* 60* 46* 35 32  ALKPHOS 180* 166* 166* 145* 140*  BILITOT 2.8* 1.7* 1.2 1.0 0.9  PROT 5.0*  5.0* 5.4* 5.4* 5.5*  ALBUMIN  1.8* 1.7* 2.0* 1.8* 1.8*   This patient is critically ill with multiple organ system failure which requires frequent high complexity decision making, assessment, support, evaluation, and titration of therapies. This was completed through the application of advanced monitoring technologies and extensive interpretation of multiple databases. During this encounter critical care time was devoted to patient care services described in this note for 65 minutes.  Leita SHAUNNA Gaskins, DO 06/01/24 12:23 PM [facility]  For contact information, see Amion. If no response to pager, please call P[facility]M consult pager. After hours, 7PM- 7AM, please  call El[facility].

## 2024-06-01 NOTE — Progress Notes (Signed)
 5 Days Post-Op Procedure(s) (LRB): CREATION, TRACHEOSTOMY (N/A) Subjective: Events of morning noted Intubated and sedated  Objective: Vital signs in last 24 hours: Temp:  [98.1 F (36.7 C)-99.1 F (37.3 C)] 98.6 F (37 C) (11/08 1245) Pulse Rate:  [43-96] 70 (11/08 1245) Cardiac Rhythm: Sinus bradycardia (11/08 0800) Resp:  [0-35] 9 (11/08 1245) BP: (83-121)/(47-71) 98/62 (11/08 1200) SpO2:  [31 %-96 %] 85 % (11/08 1245) Arterial Line BP: (82-211)/(38-76) 112/52 (11/08 1245) FiO2 (%):  [60 %-70 %] 70 % (11/08 0730) Weight:  [880 kg] 119 kg (11/08 0500)  Hemodynamic parameters for last 24 hours: CVP:  [4 mmHg] 4 mmHg  Intake/Output from previous day: 11/07 0701 - 11/08 0700 In: 4505.6 [I.V.:2085.4; NG/GT:2310; IV Piggyback:110.2] Out: 8690 [Urine:8380; Stool:310] Intake/Output this shift: Total I/O In: 1567.6 [I.V.:434.8; NG/GT:926.1; IV Piggyback:206.7] Out: 1250 [Urine:1250]  General appearance: intubated Neurologic: sedated Heart: brady, regular Lungs: minimal air movement  Lab Results: Recent Labs    06/01/24 0347 06/01/24 0459 06/01/24 1047 06/01/24 1053 06/01/24 1148  WBC 11.4*  --  11.1*  --   --   HGB 7.8*   < > 6.9* 11.2* 8.5*  HCT 23.3*   < > 21.6* 33.0* 25.0*  PLT 145*  --  133*  --   --    < > = values in this interval not displayed.   BMET:  Recent Labs    05/31/24 1540 05/31/24 1548 06/01/24 0347 06/01/24 0459 06/01/24 1053 06/01/24 1148  NA 138   < > 136   < > 135 137  K 4.2   < > 3.3*   < > 3.2* 2.9*  CL 105  --  98  --   --   --   CO2 23  --  25  --   --   --   GLUCOSE 160*  --  149*  --   --   --   BUN 36*  --  37*  --   --   --   CREATININE 0.72  --  0.70  --   --   --   CALCIUM  7.8*  --  8.4*  --   --   --    < > = values in this interval not displayed.    PT/INR:  Recent Labs    06/01/24 0347  LABPROT 17.7*  INR 1.4*   ABG    Component Value Date/Time   PHART 7.415 06/01/2024 1148   HCO3 26.9 06/01/2024 1148   TCO2  28 06/01/2024 1148   ACIDBASEDEF 3.0 (H) 06/01/2024 1053   O2SAT 86 06/01/2024 1148   CBG (last 3)  Recent Labs    06/01/24 0351 06/01/24 0745 06/01/24 1144  GLUCAP 152* 143* 162*    Assessment/Plan: S/P Procedure(s) (LRB): CREATION, TRACHEOSTOMY (N/A) Remains critically ill NEURO- sedated on midazolam , propofol   Has been agitated when sedation lightened CV- remains bradycardic, BP OK  Vasopressin  0.04, norepi 8 post events RESP- VDRF,  ARDS, post lobectomy, fungal pneumonia  VV ECMO  Minimal tidal volumes  Extremely high pressures when had to ventilate ealier  Air leak resolved  Keep CT in space ID- micafungin for Candida pneumonia/ sepsis RENAL- on lasix drip  Creatinine normal  Replete K GI- tolerating TF  Ulcer prophylaxis with protonix  DVT- on bivalrudin   LOS: 23 days    Keith Gibbs 06/01/2024

## 2024-06-01 NOTE — Progress Notes (Signed)
 Patient ID: RODRIC PUNCH, male   DOB: [birth year], 63 y.o.   MRN: [id number] ECMO NOTE:   Indication: Respiratory failure due to ARDS   Initial cannulation date: 05/22/24   ECMO type: VV ECMO   Dual lumen inflow/return cannula:   1) 62F Crescent placed in RIJ  NO 30 ppm into oxygenator for clotting   Daily data:   Flow 4.4 L/min RPM 3900 Sweep 9 L Pven -100 deltaP 25  Anticoagulation:  Bivalirudin  PTT 70-80    Plan:    Continue VV ECMO support Await lung recovery, expect prolonged course No significant fibrosis on chest CT per CCM  [name] 06/01/2024 9:39 AM

## 2024-06-01 NOTE — Progress Notes (Addendum)
 PHARMACY - ANTICOAGULATION CONSULT NOTE  Pharmacy Consult for heparin  >> bivalirudin  Indication: VTE, VV ECMO (10/29 - current)  Allergies  Allergen Reactions   Asenapine Other (See Comments) and Nausea And Vomiting    Increased tremors   Dextromethorphan Hbr    Guaifenesin     Latuda [Lurasidone Hcl] Other (See Comments)    Tremors     Lurasidone Other (See Comments) and Hives    Other Reaction(s): Angioedema   Phenylephrine      Patient Measurements: Height: 6' 3 (190.5 cm) Weight: 119 kg (262 lb 5.6 oz) IBW/kg (Calculated) : 84.5  Vital Signs: Temp: 99.1 F (37.3 C) (11/08 1700) Temp Source: Bladder (11/08 1145) BP: 91/58 (11/08 1600) Pulse Rate: 58 (11/08 1700)  Labs: Recent Labs    05/30/24 0420 05/30/24 1200 05/31/24 0427 05/31/24 0437 05/31/24 1540 05/31/24 1548 06/01/24 0347 06/01/24 0459 06/01/24 1047 06/01/24 1053 06/01/24 1426 06/01/24 1619 06/01/24 1629  HGB 8.0*  7.8*   < > 7.6*   < > 7.7*   < > 7.8*   < > 6.9*   < > 7.5* 8.3* 7.5*  HCT 25.3*  23.0*   < > 23.8*   < > 23.8*   < > 23.3*   < > 21.6*   < > 22.0* 24.7* 22.0*  PLT 188   < > 164  --  147*  --  145*  --  133*  --   --  161  --   APTT 60*   < > 71*  --  73*  --  71*  --   --   --   --  79*  --   LABPROT 17.1*  --  17.3*  --   --   --  17.7*  --   --   --   --   --   --   INR 1.3*  --  1.3*  --   --   --  1.4*  --   --   --   --   --   --   CREATININE 0.77   < > 0.57*  --  0.72  --  0.70  --   --   --   --   --   --    < > = values in this interval not displayed.    Estimated Creatinine Clearance: 131.4 mL/min (by C-G formula based on SCr of 0.7 mg/dL).   Assessment: 56 yoM admitted for RUL lobectomy.  Pt intubated, now having difficulty with ventilation. Placed on VV ECMO 10/29 in the evening. Pharmacy to dose IV bivalirudin .   Previously on therapeutic heparin  (HL 0.33 on 1700/h) for acute DVT of gastrocnemius vein  > changed to bivalirudin  with ECMO cannulation  S/p circuit  change 11/4 S/p circuit exchange 11/8  Bivalirudin  0.11mg /kg/hr with aPTT 71 sec is therapeutic for new goal. No bleeding noted. Cbc stable LDH stable fibrinogen stable.  PTT came back in goal again this PM. No bleeding issue per Rn. Cont current rate.   Goal of Therapy:  aPTT goal increased to 70-80 sec on 11/7 Monitor platelets by anticoagulation protocol: Yes   Plan:  Continue bivalirudin  0.11 mg/kg/hr Q12h aPTT and CBC   Sergio Batch, PharmD, BCIDP, AAHIVP, CPP Infectious Disease Pharmacist 06/01/2024 5:35 PM

## 2024-06-02 ENCOUNTER — Inpatient Hospital Stay (HOSPITAL_COMMUNITY): Payer: MEDICAID

## 2024-06-02 DIAGNOSIS — J9621 Acute and chronic respiratory failure with hypoxia: Secondary | ICD-10-CM | POA: Diagnosis not present

## 2024-06-02 DIAGNOSIS — B371 Pulmonary candidiasis: Secondary | ICD-10-CM | POA: Diagnosis not present

## 2024-06-02 DIAGNOSIS — J939 Pneumothorax, unspecified: Secondary | ICD-10-CM | POA: Diagnosis not present

## 2024-06-02 DIAGNOSIS — Z902 Acquired absence of lung [part of]: Secondary | ICD-10-CM | POA: Diagnosis not present

## 2024-06-02 DIAGNOSIS — J80 Acute respiratory distress syndrome: Secondary | ICD-10-CM | POA: Diagnosis not present

## 2024-06-02 DIAGNOSIS — J9622 Acute and chronic respiratory failure with hypercapnia: Secondary | ICD-10-CM | POA: Diagnosis not present

## 2024-06-02 LAB — CBC WITH DIFFERENTIAL/PLATELET
Basophils Absolute: 0 K/uL (ref 0.0–0.1)
Basophils Relative: 0 %
Eosinophils Absolute: 1 K/uL — ABNORMAL HIGH (ref 0.0–0.5)
Eosinophils Relative: 8 %
HCT: 24.1 % — ABNORMAL LOW (ref 39.0–52.0)
Hemoglobin: 7.8 g/dL — ABNORMAL LOW (ref 13.0–17.0)
Lymphocytes Relative: 13 %
Lymphs Abs: 1.7 K/uL (ref 0.7–4.0)
MCH: 32.1 pg (ref 26.0–34.0)
MCHC: 32.4 g/dL (ref 30.0–36.0)
MCV: 99.2 fL (ref 80.0–100.0)
Monocytes Absolute: 0.1 K/uL (ref 0.1–1.0)
Monocytes Relative: 1 %
Neutro Abs: 10 K/uL — ABNORMAL HIGH (ref 1.7–7.7)
Neutrophils Relative %: 78 %
Platelets: 146 K/uL — ABNORMAL LOW (ref 150–400)
RBC: 2.43 MIL/uL — ABNORMAL LOW (ref 4.22–5.81)
RDW: 19.7 % — ABNORMAL HIGH (ref 11.5–15.5)
WBC: 12.8 K/uL — ABNORMAL HIGH (ref 4.0–10.5)
nRBC: 3.5 % — ABNORMAL HIGH (ref 0.0–0.2)

## 2024-06-02 LAB — POCT I-STAT 7, (LYTES, BLD GAS, ICA,H+H)
Acid-Base Excess: 4 mmol/L — ABNORMAL HIGH (ref 0.0–2.0)
Acid-Base Excess: 5 mmol/L — ABNORMAL HIGH (ref 0.0–2.0)
Acid-Base Excess: 5 mmol/L — ABNORMAL HIGH (ref 0.0–2.0)
Acid-Base Excess: 6 mmol/L — ABNORMAL HIGH (ref 0.0–2.0)
Acid-Base Excess: 6 mmol/L — ABNORMAL HIGH (ref 0.0–2.0)
Acid-Base Excess: 7 mmol/L — ABNORMAL HIGH (ref 0.0–2.0)
Acid-Base Excess: 5 mmol/L — ABNORMAL HIGH (ref 0.0–2.0)
Acid-Base Excess: 8 mmol/L — ABNORMAL HIGH (ref 0.0–2.0)
Bicarbonate: 30.8 mmol/L — ABNORMAL HIGH (ref 20.0–28.0)
Bicarbonate: 32 mmol/L — ABNORMAL HIGH (ref 20.0–28.0)
Bicarbonate: 32.4 mmol/L — ABNORMAL HIGH (ref 20.0–28.0)
Bicarbonate: 32.3 mmol/L — ABNORMAL HIGH (ref 20.0–28.0)
Bicarbonate: 32 mmol/L — ABNORMAL HIGH (ref 20.0–28.0)
Bicarbonate: 33 mmol/L — ABNORMAL HIGH (ref 20.0–28.0)
Bicarbonate: 31.1 mmol/L — ABNORMAL HIGH (ref 20.0–28.0)
Bicarbonate: 33.3 mmol/L — ABNORMAL HIGH (ref 20.0–28.0)
Calcium, Ion: 1.1 mmol/L — ABNORMAL LOW (ref 1.15–1.40)
Calcium, Ion: 1.16 mmol/L (ref 1.15–1.40)
Calcium, Ion: 1.16 mmol/L (ref 1.15–1.40)
Calcium, Ion: 1.14 mmol/L — ABNORMAL LOW (ref 1.15–1.40)
Calcium, Ion: 1.16 mmol/L (ref 1.15–1.40)
Calcium, Ion: 1.14 mmol/L — ABNORMAL LOW (ref 1.15–1.40)
Calcium, Ion: 1.13 mmol/L — ABNORMAL LOW (ref 1.15–1.40)
Calcium, Ion: 1.13 mmol/L — ABNORMAL LOW (ref 1.15–1.40)
HCT: 21 % — ABNORMAL LOW (ref 39.0–52.0)
HCT: 23 % — ABNORMAL LOW (ref 39.0–52.0)
HCT: 22 % — ABNORMAL LOW (ref 39.0–52.0)
HCT: 22 % — ABNORMAL LOW (ref 39.0–52.0)
HCT: 25 % — ABNORMAL LOW (ref 39.0–52.0)
HCT: 24 % — ABNORMAL LOW (ref 39.0–52.0)
HCT: 33 % — ABNORMAL LOW (ref 39.0–52.0)
HCT: 24 % — ABNORMAL LOW (ref 39.0–52.0)
Hemoglobin: 7.1 g/dL — ABNORMAL LOW (ref 13.0–17.0)
Hemoglobin: 7.8 g/dL — ABNORMAL LOW (ref 13.0–17.0)
Hemoglobin: 7.5 g/dL — ABNORMAL LOW (ref 13.0–17.0)
Hemoglobin: 7.5 g/dL — ABNORMAL LOW (ref 13.0–17.0)
Hemoglobin: 8.5 g/dL — ABNORMAL LOW (ref 13.0–17.0)
Hemoglobin: 8.2 g/dL — ABNORMAL LOW (ref 13.0–17.0)
Hemoglobin: 11.2 g/dL — ABNORMAL LOW (ref 13.0–17.0)
Hemoglobin: 8.2 g/dL — ABNORMAL LOW (ref 13.0–17.0)
O2 Saturation: 78 %
O2 Saturation: 79 %
O2 Saturation: 79 %
O2 Saturation: 73 %
O2 Saturation: 72 %
O2 Saturation: 80 %
O2 Saturation: 75 %
O2 Saturation: 80 %
Patient temperature: 36.6
Patient temperature: 37.1
Patient temperature: 37.2
Patient temperature: 37.2
Patient temperature: 37.2
Patient temperature: 37.3
Patient temperature: 37.5
Patient temperature: 37.6
Potassium: 4.6 mmol/L (ref 3.5–5.1)
Potassium: 4.3 mmol/L (ref 3.5–5.1)
Potassium: 4.6 mmol/L (ref 3.5–5.1)
Potassium: 4.7 mmol/L (ref 3.5–5.1)
Potassium: 4.4 mmol/L (ref 3.5–5.1)
Potassium: 4.2 mmol/L (ref 3.5–5.1)
Potassium: 3.8 mmol/L (ref 3.5–5.1)
Potassium: 4.1 mmol/L (ref 3.5–5.1)
Sodium: 134 mmol/L — ABNORMAL LOW (ref 135–145)
Sodium: 137 mmol/L (ref 135–145)
Sodium: 137 mmol/L (ref 135–145)
Sodium: 137 mmol/L (ref 135–145)
Sodium: 137 mmol/L (ref 135–145)
Sodium: 137 mmol/L (ref 135–145)
Sodium: 139 mmol/L (ref 135–145)
Sodium: 137 mmol/L (ref 135–145)
TCO2: 33 mmol/L — ABNORMAL HIGH (ref 22–32)
TCO2: 34 mmol/L — ABNORMAL HIGH (ref 22–32)
TCO2: 34 mmol/L — ABNORMAL HIGH (ref 22–32)
TCO2: 34 mmol/L — ABNORMAL HIGH (ref 22–32)
TCO2: 34 mmol/L — ABNORMAL HIGH (ref 22–32)
TCO2: 35 mmol/L — ABNORMAL HIGH (ref 22–32)
TCO2: 33 mmol/L — ABNORMAL HIGH (ref 22–32)
TCO2: 35 mmol/L — ABNORMAL HIGH (ref 22–32)
pCO2 arterial: 60 mmHg — ABNORMAL HIGH (ref 32–48)
pCO2 arterial: 59.8 mmHg — ABNORMAL HIGH (ref 32–48)
pCO2 arterial: 65.3 mmHg (ref 32–48)
pCO2 arterial: 58.1 mmHg — ABNORMAL HIGH (ref 32–48)
pCO2 arterial: 57.6 mmHg — ABNORMAL HIGH (ref 32–48)
pCO2 arterial: 53.9 mmHg — ABNORMAL HIGH (ref 32–48)
pCO2 arterial: 51.9 mmHg — ABNORMAL HIGH (ref 32–48)
pCO2 arterial: 51.7 mmHg — ABNORMAL HIGH (ref 32–48)
pH, Arterial: 7.317 — ABNORMAL LOW (ref 7.35–7.45)
pH, Arterial: 7.337 — ABNORMAL LOW (ref 7.35–7.45)
pH, Arterial: 7.304 — ABNORMAL LOW (ref 7.35–7.45)
pH, Arterial: 7.354 (ref 7.35–7.45)
pH, Arterial: 7.354 (ref 7.35–7.45)
pH, Arterial: 7.397 (ref 7.35–7.45)
pH, Arterial: 7.387 (ref 7.35–7.45)
pH, Arterial: 7.42 (ref 7.35–7.45)
pO2, Arterial: 47 mmHg — ABNORMAL LOW (ref 83–108)
pO2, Arterial: 47 mmHg — ABNORMAL LOW (ref 83–108)
pO2, Arterial: 50 mmHg — ABNORMAL LOW (ref 83–108)
pO2, Arterial: 42 mmHg — ABNORMAL LOW (ref 83–108)
pO2, Arterial: 41 mmHg — ABNORMAL LOW (ref 83–108)
pO2, Arterial: 46 mmHg — ABNORMAL LOW (ref 83–108)
pO2, Arterial: 43 mmHg — ABNORMAL LOW (ref 83–108)
pO2, Arterial: 46 mmHg — ABNORMAL LOW (ref 83–108)

## 2024-06-02 LAB — CULTURE, BLOOD (ROUTINE X 2): Special Requests: ADEQUATE

## 2024-06-02 LAB — BASIC METABOLIC PANEL WITH GFR
Anion gap: 13 (ref 5–15)
Anion gap: 9 (ref 5–15)
BUN: 46 mg/dL — ABNORMAL HIGH (ref 8–23)
BUN: 45 mg/dL — ABNORMAL HIGH (ref 8–23)
CO2: 27 mmol/L (ref 22–32)
CO2: 30 mmol/L (ref 22–32)
Calcium: 8 mg/dL — ABNORMAL LOW (ref 8.9–10.3)
Calcium: 8.1 mg/dL — ABNORMAL LOW (ref 8.9–10.3)
Chloride: 97 mmol/L — ABNORMAL LOW (ref 98–111)
Chloride: 97 mmol/L — ABNORMAL LOW (ref 98–111)
Creatinine, Ser: 0.71 mg/dL (ref 0.61–1.24)
Creatinine, Ser: 0.73 mg/dL (ref 0.61–1.24)
GFR, Estimated: >60 mL/min (ref >60)
GFR, Estimated: >60 mL/min (ref >60)
Glucose, Bld: 164 mg/dL — ABNORMAL HIGH (ref 70–99)
Glucose, Bld: 154 mg/dL — ABNORMAL HIGH (ref 70–99)
Potassium: 4.3 mmol/L (ref 3.5–5.1)
Potassium: 4.2 mmol/L (ref 3.5–5.1)
Sodium: 137 mmol/L (ref 135–145)
Sodium: 136 mmol/L (ref 135–145)

## 2024-06-02 LAB — HEPATIC FUNCTION PANEL
ALT: 35 U/L (ref 0–44)
AST: 54 U/L — ABNORMAL HIGH (ref 15–41)
Albumin: 1.9 g/dL — ABNORMAL LOW (ref 3.5–5.0)
Alkaline Phosphatase: 133 U/L — ABNORMAL HIGH (ref 38–126)
Bilirubin, Direct: 0.4 mg/dL — ABNORMAL HIGH (ref 0.0–0.2)
Indirect Bilirubin: 0.7 mg/dL (ref 0.3–0.9)
Total Bilirubin: 1.1 mg/dL (ref 0.0–1.2)
Total Protein: 5.6 g/dL — ABNORMAL LOW (ref 6.5–8.1)

## 2024-06-02 LAB — CBC
HCT: 26.2 % — ABNORMAL LOW (ref 39.0–52.0)
Hemoglobin: 8.4 g/dL — ABNORMAL LOW (ref 13.0–17.0)
MCH: 32.1 pg (ref 26.0–34.0)
MCHC: 32.1 g/dL (ref 30.0–36.0)
MCV: 100 fL (ref 80.0–100.0)
Platelets: 182 K/uL (ref 150–400)
RBC: 2.62 MIL/uL — ABNORMAL LOW (ref 4.22–5.81)
RDW: 19.9 % — ABNORMAL HIGH (ref 11.5–15.5)
WBC: 20.9 K/uL — ABNORMAL HIGH (ref 4.0–10.5)
nRBC: 3.9 % — ABNORMAL HIGH (ref 0.0–0.2)

## 2024-06-02 LAB — CG4 I-STAT (LACTIC ACID)
Lactic Acid, Venous: 1.2 mmol/L (ref 0.5–1.9)
Lactic Acid, Venous: 1 mmol/L (ref 0.5–1.9)
Lactic Acid, Venous: 2.2 mmol/L (ref 0.5–1.9)

## 2024-06-02 LAB — PROTIME-INR
INR: 1.4 — ABNORMAL HIGH (ref 0.8–1.2)
Prothrombin Time: 17.7 s — ABNORMAL HIGH (ref 11.4–15.2)

## 2024-06-02 LAB — MAGNESIUM: Magnesium: 2.1 mg/dL (ref 1.7–2.4)

## 2024-06-02 LAB — GLUCOSE, CAPILLARY
Glucose-Capillary: 158 mg/dL — ABNORMAL HIGH (ref 70–99)
Glucose-Capillary: 160 mg/dL — ABNORMAL HIGH (ref 70–99)
Glucose-Capillary: 163 mg/dL — ABNORMAL HIGH (ref 70–99)
Glucose-Capillary: 196 mg/dL — ABNORMAL HIGH (ref 70–99)
Glucose-Capillary: 203 mg/dL — ABNORMAL HIGH (ref 70–99)
Glucose-Capillary: 223 mg/dL — ABNORMAL HIGH (ref 70–99)

## 2024-06-02 LAB — APTT
aPTT: 69 s — ABNORMAL HIGH (ref 24–36)
aPTT: 70 s — ABNORMAL HIGH (ref 24–36)

## 2024-06-02 LAB — FIBRINOGEN: Fibrinogen: 560 mg/dL — ABNORMAL HIGH (ref 210–475)

## 2024-06-02 LAB — LACTATE DEHYDROGENASE: LDH: 594 U/L — ABNORMAL HIGH (ref 98–192)

## 2024-06-02 LAB — PHOSPHORUS: Phosphorus: 6.1 mg/dL — ABNORMAL HIGH (ref 2.5–4.6)

## 2024-06-02 LAB — TRIGLYCERIDES: Triglycerides: 295 mg/dL — ABNORMAL HIGH (ref <150)

## 2024-06-02 MED ORDER — HYDRALAZINE HCL 20 MG/ML IJ SOLN
10.0000 mg | Freq: Four times a day (QID) | INTRAMUSCULAR | Status: DC | PRN
  Filled 2024-06-02: qty 1

## 2024-06-02 MED ORDER — LABETALOL HCL 5 MG/ML IV SOLN
INTRAVENOUS | Status: AC
  Filled 2024-06-02: qty 4

## 2024-06-02 MED ORDER — CALCIUM CARBONATE ANTACID 500 MG PO CHEW
1.0000 | CHEWABLE_TABLET | Freq: Two times a day (BID) | ORAL | Status: DC
  Administered 2024-06-02 – 2024-06-04 (×5): 200 mg
  Filled 2024-06-02 (×5): qty 1

## 2024-06-02 MED ORDER — CLEVIDIPINE BUTYRATE 0.5 MG/ML IV EMUL
0.0000 mg/h | INTRAVENOUS | Status: DC
  Administered 2024-06-02 (×2): 14 mg/h via INTRAVENOUS
  Administered 2024-06-02: 2 mg/h via INTRAVENOUS
  Administered 2024-06-03: 8 mg/h via INTRAVENOUS
  Administered 2024-06-03: 12 mg/h via INTRAVENOUS
  Filled 2024-06-02: qty 200
  Filled 2024-06-02 (×3): qty 100

## 2024-06-02 MED ORDER — STERILE WATER FOR INJECTION IJ SOLN
INTRAMUSCULAR | Status: AC
  Administered 2024-06-02: 2.1 mL
  Filled 2024-06-02: qty 10

## 2024-06-02 MED ORDER — METOLAZONE 5 MG PO TABS
5.0000 mg | ORAL_TABLET | Freq: Once | ORAL | Status: AC
  Administered 2024-06-02: 5 mg
  Filled 2024-06-02: qty 1

## 2024-06-02 MED ORDER — SODIUM CHLORIDE 0.9 % IV SOLN
0.5000 mg/h | INTRAVENOUS | Status: DC
  Administered 2024-06-02: 5 mg/h via INTRAVENOUS
  Administered 2024-06-04: 3 mg/h via INTRAVENOUS
  Filled 2024-06-02 (×2): qty 20

## 2024-06-02 MED ORDER — LABETALOL HCL 5 MG/ML IV SOLN
10.0000 mg | INTRAVENOUS | Status: DC | PRN
  Administered 2024-06-02: 10 mg via INTRAVENOUS

## 2024-06-02 MED ORDER — GERHARDT'S BUTT CREAM
TOPICAL_CREAM | Freq: Two times a day (BID) | CUTANEOUS | Status: DC
  Administered 2024-06-02 – 2024-06-03 (×2): 1 via TOPICAL
  Filled 2024-06-02: qty 60

## 2024-06-02 NOTE — Progress Notes (Signed)
 PHARMACY - ANTICOAGULATION CONSULT NOTE  Pharmacy Consult for heparin  >> bivalirudin  Indication: VTE, VV ECMO (10/29 - current)  Allergies  Allergen Reactions   Asenapine Other (See Comments) and Nausea And Vomiting    Increased tremors   Dextromethorphan Hbr    Guaifenesin     Latuda [Lurasidone Hcl] Other (See Comments)    Tremors     Lurasidone Other (See Comments) and Hives    Other Reaction(s): Angioedema   Phenylephrine      Patient Measurements: Height: 6' 3 (190.5 cm) Weight: 115.8 kg (255 lb 4.7 oz) IBW/kg (Calculated) : 84.5  Vital Signs: Temp: 99 F (37.2 C) (11/09 1400) Temp Source: Bladder (11/09 1200) BP: 160/57 (11/09 1342) Pulse Rate: 83 (11/09 1400)  Labs: Recent Labs    05/31/24 0427 05/31/24 0437 06/01/24 0347 06/01/24 0459 06/01/24 1619 06/01/24 1629 06/02/24 0420 06/02/24 0742 06/02/24 1555 06/02/24 1605 06/02/24 1735  HGB 7.6*   < > 7.8*   < > 8.3*   < > 7.8*   < > 8.4* 8.5* 8.2*  HCT 23.8*   < > 23.3*   < > 24.7*   < > 24.1*   < > 26.2* 25.0* 24.0*  PLT 164   < > 145*   < > 161  --  146*  --  182  --   --   APTT 71*   < > 71*  --  79*  --  69*  --  70*  --   --   LABPROT 17.3*  --  17.7*  --   --   --  17.7*  --   --   --   --   INR 1.3*  --  1.4*  --   --   --  1.4*  --   --   --   --   CREATININE 0.57*   < > 0.70  --  0.73  --  0.71  --  0.73  --   --    < > = values in this interval not displayed.    Estimated Creatinine Clearance: 129.7 mL/min (by C-G formula based on SCr of 0.73 mg/dL).   Assessment: 33 yoM admitted for RUL lobectomy.  Pt intubated, now having difficulty with ventilation. Placed on VV ECMO 10/29 in the evening. Pharmacy to dose IV bivalirudin .   Previously on therapeutic heparin  (HL 0.33 on 1700/h) for acute DVT of gastrocnemius vein  > changed to bivalirudin  with ECMO cannulation  S/p circuit change 11/4 S/p circuit exchange 11/8  Bivalirudin  0.11mg /kg/hr with aPTT 70 sec is therapeutic for goal. No  bleeding or infusion issues noted. Cbc stable   Goal of Therapy:  aPTT goal increased to 70-80 sec on 11/7 Monitor platelets by anticoagulation protocol: Yes   Plan:  Continue bivalirudin  0.11 mg/kg/hr Q12h aPTT and CBC   Larraine Brazier, PharmD Clinical Pharmacist 06/02/2024  6:07 PM **Pharmacist phone directory can now be found on [email or web address] ([email or web address]).  Listed under [facility].

## 2024-06-02 NOTE — Progress Notes (Signed)
 Patient ID: Keith Gibbs, male   DOB: January 24, 1961, 63 y.o.   MRN: [id number]    Advanced Heart Failure Rounding Note   Subjective:    Placed on VV ECMO 05/22/2024  Intermittently on norepinephrine, vasopressin  0.04.  MAP stable.  Positive yesterday despite good urine output.  Circuit change yesterday for air entrainment.  No further issues.  Objective:   Weight Range:  Vital Signs:   Temp:  [97.3 F (36.3 C)-99.1 F (37.3 C)] 99 F (37.2 C) (11/09 1400) Pulse Rate:  [52-83] 83 (11/09 1400) Resp:  [0-24] 13 (11/09 1400) BP: (76-160)/(52-73) 160/57 (11/09 1342) SpO2:  [71 %-94 %] 78 % (11/09 1400) Arterial Line BP: (77-172)/(39-82) 172/61 (11/09 1400) FiO2 (%):  [80 %] 80 % (11/09 1325) Weight:  [115.8 kg] 115.8 kg (11/09 0500) Last BM Date : 06/01/24  Weight change: Filed Weights   05/31/24 0500 06/01/24 0500 06/02/24 0500  Weight: 117.7 kg 119 kg 115.8 kg    Intake/Output:   Intake/Output Summary (Last 24 hours) at 06/02/2024 1520 Last data filed at 06/02/2024 1400 Gross per 24 hour  Intake 4448.76 ml  Output 4480 ml  Net -31.24 ml     Physical Exam: GENERAL: Intubated PULM:  Ventilated breath sounds CARDIAC:  JVP: mildly elevated, RIJ VV cannula in place, dressing C/d/i         Regular rate with regular rhythm. No murmurs, rubs or gallops.  No edema. Warm and well perfused extremities. ABDOMEN: Soft, non-tender, non-distended. NEUROLOGIC: Sedated   Labs: Basic Metabolic Panel: Recent Labs  Lab 05/30/24 2316 05/31/24 0427 05/31/24 0437 05/31/24 1540 05/31/24 1548 06/01/24 0347 06/01/24 0459 06/01/24 1619 06/01/24 1629 06/02/24 0329 06/02/24 0420 06/02/24 0742 06/02/24 1130 06/02/24 1318  NA  --  138   < > 138   < > 136   < > 137   < > 134* 137 137 137 137  K  --  4.2   < > 4.2   < > 3.3*   < > 3.9   < > 4.6 4.3 4.3 4.6 4.7  CL  --  107  --  105  --  98  --  96*  --   --  97*  --   --   --   CO2  --  22  --  23  --  25  --  29  --   --  27   --   --   --   GLUCOSE  --  151*  --  160*  --  149*  --  139*  --   --  164*  --   --   --   BUN  --  36*  --  36*  --  37*  --  38*  --   --  46*  --   --   --   CREATININE  --  0.57*  --  0.72  --  0.70  --  0.73  --   --  0.71  --   --   --   CALCIUM   --  7.8*  --  7.8*  --  8.4*  --  7.9*  --   --  8.0*  --   --   --   MG 1.9 2.0  --   --   --  1.8  --  2.0  --   --  2.1  --   --   --   PHOS 3.9 4.4  --   --   --  5.1*  --  5.7*  --   --  6.1*  --   --   --    < > = values in this interval not displayed.    Liver Function Tests: Recent Labs  Lab 05/29/24 0445 05/30/24 0420 05/31/24 0427 06/01/24 0347 06/02/24 0420  AST 48* 35 35 47* 54*  ALT 60* 46* 35 32 35  ALKPHOS 166* 166* 145* 140* 133*  BILITOT 1.7* 1.2 1.0 0.9 1.1  PROT 5.0* 5.4* 5.4* 5.5* 5.6*  ALBUMIN  1.7* 2.0* 1.8* 1.8* 1.9*   No results for input(s): LIPASE, AMYLASE in the last 168 hours. No results for input(s): AMMONIA in the last 168 hours.  CBC: Recent Labs  Lab 05/29/24 0445 05/29/24 1623 05/30/24 0420 05/30/24 1205 05/31/24 0427 05/31/24 0437 05/31/24 1540 05/31/24 1548 06/01/24 0347 06/01/24 0459 06/01/24 1047 06/01/24 1053 06/01/24 1619 06/01/24 1629 06/02/24 0329 06/02/24 0420 06/02/24 0742 06/02/24 1130 06/02/24 1318  WBC 13.0*   < > 12.2*   < > 10.5  --  11.7*  --  11.4*  --  11.1*  --  17.5*  --   --  12.8*  --   --   --   NEUTROABS 7.4  --  7.6  --  6.5  --   --   --  7.8*  --   --   --   --   --   --  10.0*  --   --   --   HGB 8.2*   < > 8.0*  7.8*   < > 7.6*   < > 7.7*   < > 7.8*   < > 6.9*   < > 8.3*   < > 7.1* 7.8* 7.8* 7.5* 7.5*  HCT 25.6*   < > 25.3*  23.0*   < > 23.8*   < > 23.8*   < > 23.3*   < > 21.6*   < > 24.7*   < > 21.0* 24.1* 23.0* 22.0* 22.0*  MCV 101.6*   < > 103.3*   < > 102.1*  --  101.7*  --  99.1  --  101.4*  --  95.4  --   --  99.2  --   --   --   PLT 215   < > 188   < > 164  --  147*  --  145*  --  133*  --  161  --   --  146*  --   --   --    < > =  values in this interval not displayed.    Cardiac Enzymes: No results for input(s): CKTOTAL, CKMB, CKMBINDEX, TROPONINI in the last 168 hours.  BNP: BNP (last 3 results) Recent Labs    03/20/24 1419  BNP 20.6    ProBNP (last 3 results) No results for input(s): PROBNP in the last 8760 hours.      Medications:     Scheduled Medications:  sodium chloride    Intravenous Once   arformoterol  15 mcg Nebulization BID   calcium  carbonate  1 tablet Per Tube BID   Chlorhexidine  Gluconate Cloth  6 each Topical Daily   clonazePAM  1 mg Oral Q8H   feeding supplement (PROSource TF20)  60 mL Per Tube QID   fiber supplement (BANATROL TF)  60 mL Per Tube BID   free water   100 mL Per Tube Q4H   gabapentin   600 mg Oral Daily   And   gabapentin   1,200 mg Oral QHS   insulin aspart  0-15 Units Subcutaneous Q4H   OLANZapine   5 mg Intravenous Q8H   mouth rinse  15 mL Mouth Rinse Q2H   oxyCODONE   10 mg Oral QID   pantoprazole  (PROTONIX ) IV  40 mg Intravenous QHS   polyethylene glycol  17 g Oral Daily   potassium chloride   40 mEq Per Tube BID   revefenacin  175 mcg Nebulization Daily   senna-docusate  2 tablet Oral QHS   sodium chloride  flush  10-40 mL Intracatheter Q12H   sodium chloride  flush  10-40 mL Intracatheter Q12H    Infusions:  albumin  human Stopped (05/29/24 2308)   bivalirudin  (ANGIOMAX ) 250 mg in sodium chloride  0.9 % 500 mL (0.5 mg/mL) infusion 0.11 mg/kg/hr (06/02/24 1200)   clevidipine 2 mg/hr (06/02/24 1510)   feeding supplement (VITAL 1.5 CAL) 65 mL/hr at 06/02/24 1200   furosemide (LASIX) 200 mg in dextrose  5 % 100 mL (2 mg/mL) infusion 8 mg/hr (06/02/24 1200)   HYDROmorphone  5 mg/hr (06/02/24 1200)   micafungin (MYCAMINE) 200 mg in sodium chloride  0.9 % 100 mL IVPB Stopped (06/01/24 1817)   midazolam  2 mg/hr (06/02/24 1200)   norepinephrine (LEVOPHED) Adult infusion 12 mcg/min (06/02/24 1200)   propofol  (DIPRIVAN ) infusion 40 mcg/kg/min (06/02/24 1229)    vasopressin  0.04 Units/min (06/02/24 1200)    PRN Medications: albumin  human, hydrALAZINE , HYDROmorphone , hydrOXYzine , ipratropium-albuterol , lactulose, midazolam , ondansetron  (ZOFRAN ) IV, mouth rinse, mouth rinse, sodium chloride  flush, sodium chloride  flush   Assessment/Plan:   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 - s/p VV ECMO cannulation 05/22/24 - VV ECMO flows look good, significant fibrin buildup on previous oxygenator - S/p circuit change on 11/4, improvement in post gas from 151 to 540  - S/p circuit change 11/8 for air - Bivalirudin  goal PTT of 70-80, discussed goals with pharmacy - Nitric oxide through the circuit for anticoagulation and long pump run - Will try to wean sedation today and slowly awaken patient, can wean flows to do so - TEE with cannula adjustment 11/3 - Completed course of meropenem on 11/7 - TV remain extremely low, suspect prolonged recovery - Continue diuresis with Lasix drip at 8 and metolazone 5 mg - CCM managing vent and R chest tube - Trach 11/3, appreciate ENT - Continue micafungin - Transfuse hgb < 7.5  Shock: - Primarily septic/vasodilatory.  On norepinephrine intermittently and on vasopressin  0.04.   Bradycardia: - Intermittent, continued sinus bradycardia. HR 50 bpm today.    RUL lung CA s/p lobectomy COPD with ongoing tobacco use Bipolar d/o CAD Candida glabrata fungemia/PNA - ID following - on micafungin for fungemia, ID following, course to be determined given indwelling lines - steroids off - Completed meropenem 11/7  CHF/HFpEF - Good diuresis yesterday with Lasix gtt 8 mg/hr but net positive - Decrease free water  flushes, creatinine stable 0.7.  Continue Lasix drip.   Remains critically ill with ARDS/fungal PNA/PTX on top of severe underlying lung disease. Long run expected  CRITICAL CARE Performed by: Morene JINNY Brownie   Total critical care time: 40 minutes  Critical care  time was exclusive of separately billable procedures and treating other patients.  Critical care was necessary to treat or prevent imminent or life-threatening deterioration.  Critical care was time spent personally by me on the following activities: development of treatment plan with patient and/or surrogate as well as nursing, discussions with consultants, evaluation of patient's response to treatment, examination of patient, obtaining history from patient  or surrogate, ordering and performing treatments and interventions, ordering and review of laboratory studies, ordering and review of radiographic studies, pulse oximetry and re-evaluation of patient's condition.

## 2024-06-02 NOTE — Progress Notes (Signed)
 30 mg of dilaudid  wasted with Jeronimo Cruz-Reyes, RN

## 2024-06-02 NOTE — CV Procedure (Signed)
 ECMO NOTE:   Indication: Respiratory failure due to ARDS   Initial cannulation date: 05/22/24   ECMO type: VV ECMO   Dual lumen inflow/return cannula:   1) 83F Crescent placed in RIJ    Daily data:   Flow 4.13L RPM 3090 Sweep  9L Pven -61   Labs:   ABG    Component Value Date/Time   PHART 7.354 06/02/2024 1318   PCO2ART 58.1 (H) 06/02/2024 1318   PO2ART 42 (L) 06/02/2024 1318   HCO3 32.3 (H) 06/02/2024 1318   TCO2 34 (H) 06/02/2024 1318   ACIDBASEDEF 3.0 (H) 06/01/2024 1053   O2SAT 73 06/02/2024 1318    Hgb 7.8 Platelets 146 LDH 594 PTT 69 goal of 70-80.   Plan:   Continue VV ECMO support Await lung recovery, expect prolonged course No significant fibrosis on chest CT per CCM Circuit change 11/4 for reduced oxygenator efficiency Circuit change 11/8 for air entrainment Continue diuresis with Lasix drip at 8, metolazone 5 mg once today Continue antifungals, completed course of meropenem for HAP Continue to wean sedation, can reduce flows to do so Nitric oxide through the circuit Small deposits at 3 and 9 despite recent circuit change   Morene JINNY Brownie, MD  3:18 PM

## 2024-06-02 NOTE — Progress Notes (Signed)
 PHARMACY - ANTICOAGULATION CONSULT NOTE  Pharmacy Consult for heparin  >> bivalirudin  Indication: VTE, VV ECMO (10/29 - current)  Allergies  Allergen Reactions   Asenapine Other (See Comments) and Nausea And Vomiting    Increased tremors   Dextromethorphan Hbr    Guaifenesin     Latuda [Lurasidone Hcl] Other (See Comments)    Tremors     Lurasidone Other (See Comments) and Hives    Other Reaction(s): Angioedema   Phenylephrine      Patient Measurements: Height: 6' 3 (190.5 cm) Weight: 115.8 kg (255 lb 4.7 oz) IBW/kg (Calculated) : 84.5  Vital Signs: Temp: 99 F (37.2 C) (11/09 1400) Temp Source: Bladder (11/09 1200) BP: 160/57 (11/09 1342) Pulse Rate: 83 (11/09 1400)  Labs: Recent Labs    05/31/24 0427 05/31/24 0437 06/01/24 0347 06/01/24 0459 06/01/24 1047 06/01/24 1053 06/01/24 1619 06/01/24 1629 06/02/24 0420 06/02/24 0742 06/02/24 1130 06/02/24 1318  HGB 7.6*   < > 7.8*   < > 6.9*   < > 8.3*   < > 7.8* 7.8* 7.5* 7.5*  HCT 23.8*   < > 23.3*   < > 21.6*   < > 24.7*   < > 24.1* 23.0* 22.0* 22.0*  PLT 164   < > 145*  --  133*  --  161  --  146*  --   --   --   APTT 71*   < > 71*  --   --   --  79*  --  69*  --   --   --   LABPROT 17.3*  --  17.7*  --   --   --   --   --  17.7*  --   --   --   INR 1.3*  --  1.4*  --   --   --   --   --  1.4*  --   --   --   CREATININE 0.57*   < > 0.70  --   --   --  0.73  --  0.71  --   --   --    < > = values in this interval not displayed.    Estimated Creatinine Clearance: 129.7 mL/min (by C-G formula based on SCr of 0.71 mg/dL).   Assessment: 40 yoM admitted for RUL lobectomy.  Pt intubated, now having difficulty with ventilation. Placed on VV ECMO 10/29 in the evening. Pharmacy to dose IV bivalirudin .   Previously on therapeutic heparin  (HL 0.33 on 1700/h) for acute DVT of gastrocnemius vein  > changed to bivalirudin  with ECMO cannulation  S/p circuit change 11/4 S/p circuit exchange 11/8  Bivalirudin  0.11mg /kg/hr  with aPTT 69 sec is therapeutic for goal. No bleeding noted. Cbc stable LDH stable fibrinogen stable.   Goal of Therapy:  aPTT goal increased to 70-80 sec on 11/7 Monitor platelets by anticoagulation protocol: Yes   Plan:  Continue bivalirudin  0.11 mg/kg/hr Q12h aPTT and CBC   Olam Chalk Pharm.D. CPP, BCPS Clinical Pharmacist [phone] 06/02/2024 2:45 PM

## 2024-06-02 NOTE — Progress Notes (Signed)
 20 cc dilaudid  wasted/witnessed with Thersia Penner RN.

## 2024-06-02 NOTE — Progress Notes (Signed)
 ID PROGRESS NOTE  24hr events: had ecmo circuit change yesterday which he tolerated. A dose of vancomycin  for surgical proph   A/P: complicated fungemia with c.glabrata. TEE ruled out endocarditis.  -continue on micafungin, recommend minimum of weekly CMP to monitor LFT - length of therapy is complicated due to his multiple lines - his date of clearance of fungemina is 10/30 - end date is TBD. Prudently, it would be when all his original lines have been removed/exchanged - currently at day 10 since clearance of fungemia - will plan for 4 wk, through 06/20/24 - please contact ID team to re-evaluate should he worsen or need to expand antibiotics.  - contact on 11/25 to discuss if need to extend antifungal coverage.  Will sign off for now.

## 2024-06-02 NOTE — Progress Notes (Signed)
   [address] [place] [address]             [phone]   No new issues today  BP (!) 160/57   Pulse 83   Temp 99 F (37.2 C)   Resp 13   Ht 6' 3 (1.905 m)   Wt 115.8 kg   SpO2 (!) 77%   BMI 31.91 kg/m   Vasopressin  off, norepi off  Cleviprex started for hypertension   Intake/Output Summary (Last 24 hours) at 06/02/2024 1814 Last data filed at 06/02/2024 1800 Gross per 24 hour  Intake 3960.94 ml  Output 5450 ml  Net -1489.06 ml   Continue current care Sedation reduced to assess neuro status  Elspeth C. Kerrin, MD [facility] [phone]

## 2024-06-02 NOTE — Progress Notes (Signed)
 NAME:  Keith Gibbs, MRN:  [id number], DOB:  1961/04/17, LOS: 24 ADMISSION DATE:  05/09/2024, CONSULTATION DATE: 05/14/2024 REFERRING MD: CTS, CHIEF COMPLAINT: Acute on chronic hypoxic respiratory failure  History of Present Illness:  63 yo male presented for lobectomy with CTS. Progressing as expected post operatively with CT in place and was on 4E when this morning he decompensated with worsening hypoxemia and increased wob. CTS transferred to ICU and requested CCM evaluation.   Patient went into ARDS and eventually cannulated for VV ECMO.  Pertinent  Medical History  RUL nodule s/p RUL lobectomy Bipolar 1 d/o Schizophrenia Polysubstance abuse Cad Mild to mod Ao insuff Htn Hyperlipidemia Tobacco use COPD Hep C  Significant Hospital Events: Including procedures, antibiotic start and stop dates in addition to other pertinent events   Admitted 10/16 for RUL Lobectomy Transferred to ICU 10/21  10/24 started zosyn 10/26 - Intubated.  He was difficult to oxygenate and ventilate therefore APRV started.  Started meropenem, azithromycin , cefepime, & vanc 10/27 con't vanc & mero 10/29 -nitric oxide weaned off overnight-did not change his respiratory status.  Proned. 10/29 -worsened despite proning > cannulated for VV ECMO.  Started micafungin empirically, con't previous abd- vanc & mero 10/30 -blood cultures from 05/19/2024 and BAL cultures from 05/19/2024 grew Candida glabrata 10/31 - 11/2 unable to get the patient off paralytics  11/3 tracheostomy (ENT), weaned off nimbex, TEE with repositioning of crescent cannula 11/4 weaned off ketamine , circuit exchanged at beside for failing oxygenator, increasing vasopressor requirements and new bradycardia 11/8 air entrainment, emergency circuit exchange  Interim History / Subjective:  Overnight tolerated pressors weaning with sedation. Propofol  70, versed  3, dilaudid  5  Objective    Blood pressure (!) 88/61, pulse 67, temperature 98.8  F (37.1 C), resp. rate (!) 0, height 6' 3 (1.905 m), weight 115.8 kg, SpO2 (!) 83%.    Vent Mode: PCV FiO2 (%):  [80 %] 80 % Set Rate:  [15 bmp] 15 bmp PEEP:  [10 cmH20] 10 cmH20 Plateau Pressure:  [24 cmH20-25 cmH20] 25 cmH20   Intake/Output Summary (Last 24 hours) at 06/02/2024 0834 Last data filed at 06/02/2024 0600 Gross per 24 hour  Intake 5150.33 ml  Output 4105 ml  Net 1045.33 ml   Filed Weights   05/31/24 0500 06/01/24 0500 06/02/24 0500  Weight: 117.7 kg 119 kg 115.8 kg    Examination: General: critically ill appearing man lying in bed in NAD HEENT: [place]/AT, eyes anicteric Neck: trach in place Lungs: Absent breath sounds. Chest tube without air leak. Cardiovascular: S1S2, RRR Abdomen: soft, NT Extremities: peripheral edema Neuro:  RASS -5, pinpoint pupils  I/O +1.5L, net -5.L  Ventilator PC 15/15/10/60%, about 60cc Vt     VV ECMO Flow: 3705 RMP>  5.1 L with iNO via oxygenator Sweep: 9 L/min  P venous -102 Delta P 27  7.34/60/47/32 BUN 46 Cr 0.71 LDH 594  WBC 12.8 H/H 7.8/24.1 Platelet 146 PTT 69  CXR personally reviewed> some aeration in RLL & LUL.  Still with patchy opacities and heavy consolidation in LLL.  Resolved problem list  AKI Pseudomonas pneumonia Hypernatremia Hypokalemia  Assessment and Plan   Acute hypoxic and hypercapnic respiratory failure due to ARDS in the setting of candida glabrata and pseudomonas pneumonia s/p VV ECMO s/p tracheostomy 11/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 History of COPD Persistent air leak> now sealed -Con't VV ECMO support; iNO via oxygenator -bival -goal flow 4-4.5L; weaning flows to facilitate weaning sedation  -titrate sweep  for normal pH -micafungin -LTVV; ultralung protective ventilation-- PS 15, PEEP 10 -con't chest tube to water  seal, hopefully can put to suction soon since BPF sealed -goal SpO2>80% -no steriods -PAD protocol- lightening IV sedation -con't enteral sedation--  oxy & clonazepam -con't olanzapine  TID -bronchodilators- yupelri & brovana; avoid ICS -lasix gtt+ metolazone  Acute encephalopathy due to sedation History of anxiety, schizoaffective disorder bipolar type  -sedation as required to tolerate ECMO & MV -olanzapine , enteral oxy & clonazepam -PAD protocol -holding PTA cymbalta  & depakote   Sinus bradycardia> suspect this is related to loss of sympathetic effects of ketamine  Hypotension -avoiding precedex  -NE PRN for MAP<65 if hypotensive too  Hypertension when sedation lighter -hydralazine  10-20mg  Q6h PRN -avoiding Bblocker due to bradycardia when sedated  Candida glabrata pneumonia & fungemia  -micafungin per ID -needs a longer course since ECMO cannula was placed prior to starting antifungals -eventually needs ophtho eval  Hyperphosphatemia -add tums to bind  Elevated transaminases due to sepsis, improving  -monitor periodically  RLE DVT  -bival, needs treatment for minimum 3 months  Anemia in the setting of critical illness and acute blood loss -transfuse for Hb <8 or hemodynamically significant bleeding  Malnutrition  -TF at goal  Deconditioning -expect a long rehab course  GI prophylaxis: PPI  DVT prophylaxis: bival infusion   Plan discussed during ECMO rounds.     Labs   CBC: Recent Labs  Lab 05/29/24 0445 05/29/24 1623 05/30/24 0420 05/30/24 1205 05/31/24 0427 05/31/24 0437 05/31/24 1540 05/31/24 1548 06/01/24 0347 06/01/24 0459 06/01/24 1047 06/01/24 1053 06/01/24 1619 06/01/24 1629 06/01/24 2014 06/02/24 0329 06/02/24 0420 06/02/24 0742  WBC 13.0*   < > 12.2*   < > 10.5  --  11.7*  --  11.4*  --  11.1*  --  17.5*  --   --   --  12.8*  --   NEUTROABS 7.4  --  7.6  --  6.5  --   --   --  7.8*  --   --   --   --   --   --   --  10.0*  --   HGB 8.2*   < > 8.0*  7.8*   < > 7.6*   < > 7.7*   < > 7.8*   < > 6.9*   < > 8.3* 7.5* 8.2* 7.1* 7.8* 7.8*  HCT 25.6*   < > 25.3*  23.0*   < > 23.8*   <  > 23.8*   < > 23.3*   < > 21.6*   < > 24.7* 22.0* 24.0* 21.0* 24.1* 23.0*  MCV 101.6*   < > 103.3*   < > 102.1*  --  101.7*  --  99.1  --  101.4*  --  95.4  --   --   --  99.2  --   PLT 215   < > 188   < > 164  --  147*  --  145*  --  133*  --  161  --   --   --  146*  --    < > = values in this interval not displayed.    Basic Metabolic Panel: Recent Labs  Lab 05/30/24 2316 05/31/24 0427 05/31/24 0437 05/31/24 1540 05/31/24 1548 06/01/24 0347 06/01/24 0459 06/01/24 1619 06/01/24 1629 06/01/24 2014 06/02/24 0329 06/02/24 0420 06/02/24 0742  NA  --  138   < > 138   < > 136   < >  137 136 137 134* 137 137  K  --  4.2   < > 4.2   < > 3.3*   < > 3.9 4.0 4.5 4.6 4.3 4.3  CL  --  107  --  105  --  98  --  96*  --   --   --  97*  --   CO2  --  22  --  23  --  25  --  29  --   --   --  27  --   GLUCOSE  --  151*  --  160*  --  149*  --  139*  --   --   --  164*  --   BUN  --  36*  --  36*  --  37*  --  38*  --   --   --  46*  --   CREATININE  --  0.57*  --  0.72  --  0.70  --  0.73  --   --   --  0.71  --   CALCIUM   --  7.8*  --  7.8*  --  8.4*  --  7.9*  --   --   --  8.0*  --   MG 1.9 2.0  --   --   --  1.8  --  2.0  --   --   --  2.1  --   PHOS 3.9 4.4  --   --   --  5.1*  --  5.7*  --   --   --  6.1*  --    < > = values in this interval not displayed.   GFR: Estimated Creatinine Clearance: 129.7 mL/min (by C-G formula based on SCr of 0.71 mg/dL). Recent Labs  Lab 05/28/24 0855 05/28/24 1522 05/28/24 1524 05/29/24 0656 05/29/24 1623 05/31/24 1548 06/01/24 0347 06/01/24 1047 06/01/24 1619 06/02/24 0420  WBC  --   --    < >  --    < >  --  11.4* 11.1* 17.5* 12.8*  LATICACIDVEN 0.5 0.6  --  0.5  --  0.9  --   --   --   --    < > = values in this interval not displayed.    Liver Function Tests: Recent Labs  Lab 05/29/24 0445 05/30/24 0420 05/31/24 0427 06/01/24 0347 06/02/24 0420  AST 48* 35 35 47* 54*  ALT 60* 46* 35 32 35  ALKPHOS 166* 166* 145* 140* 133*   BILITOT 1.7* 1.2 1.0 0.9 1.1  PROT 5.0* 5.4* 5.4* 5.5* 5.6*  ALBUMIN  1.7* 2.0* 1.8* 1.8* 1.9*   This patient is critically ill with multiple organ system failure which requires frequent high complexity decision making, assessment, support, evaluation, and titration of therapies. This was completed through the application of advanced monitoring technologies and extensive interpretation of multiple databases. During this encounter critical care time was devoted to patient care services described in this note for 40 minutes.  Leita SHAUNNA Gaskins, DO 06/02/24 2:51 PM [facility]  For contact information, see Amion. If no response to pager, please call PCCM consult pager. After hours, 7PM- 7AM, please call Elink.

## 2024-06-03 ENCOUNTER — Inpatient Hospital Stay (HOSPITAL_COMMUNITY): Payer: MEDICAID

## 2024-06-03 DIAGNOSIS — B377 Candidal sepsis: Secondary | ICD-10-CM | POA: Diagnosis not present

## 2024-06-03 DIAGNOSIS — J9622 Acute and chronic respiratory failure with hypercapnia: Secondary | ICD-10-CM | POA: Diagnosis not present

## 2024-06-03 DIAGNOSIS — E43 Unspecified severe protein-calorie malnutrition: Secondary | ICD-10-CM | POA: Insufficient documentation

## 2024-06-03 DIAGNOSIS — J80 Acute respiratory distress syndrome: Secondary | ICD-10-CM | POA: Diagnosis not present

## 2024-06-03 DIAGNOSIS — J449 Chronic obstructive pulmonary disease, unspecified: Secondary | ICD-10-CM | POA: Diagnosis not present

## 2024-06-03 DIAGNOSIS — J9621 Acute and chronic respiratory failure with hypoxia: Secondary | ICD-10-CM | POA: Diagnosis not present

## 2024-06-03 DIAGNOSIS — Z902 Acquired absence of lung [part of]: Secondary | ICD-10-CM | POA: Diagnosis not present

## 2024-06-03 LAB — POCT I-STAT 7, (LYTES, BLD GAS, ICA,H+H)
Acid-Base Excess: 10 mmol/L — ABNORMAL HIGH (ref 0.0–2.0)
Acid-Base Excess: 9 mmol/L — ABNORMAL HIGH (ref 0.0–2.0)
Acid-Base Excess: 9 mmol/L — ABNORMAL HIGH (ref 0.0–2.0)
Acid-Base Excess: 12 mmol/L — ABNORMAL HIGH (ref 0.0–2.0)
Acid-Base Excess: 11 mmol/L — ABNORMAL HIGH (ref 0.0–2.0)
Acid-Base Excess: 11 mmol/L — ABNORMAL HIGH (ref 0.0–2.0)
Bicarbonate: 35.2 mmol/L — ABNORMAL HIGH (ref 20.0–28.0)
Bicarbonate: 34.7 mmol/L — ABNORMAL HIGH (ref 20.0–28.0)
Bicarbonate: 33.7 mmol/L — ABNORMAL HIGH (ref 20.0–28.0)
Bicarbonate: 37.5 mmol/L — ABNORMAL HIGH (ref 20.0–28.0)
Bicarbonate: 36.5 mmol/L — ABNORMAL HIGH (ref 20.0–28.0)
Bicarbonate: 35.7 mmol/L — ABNORMAL HIGH (ref 20.0–28.0)
Calcium, Ion: 1.16 mmol/L (ref 1.15–1.40)
Calcium, Ion: 1.14 mmol/L — ABNORMAL LOW (ref 1.15–1.40)
Calcium, Ion: 1.14 mmol/L — ABNORMAL LOW (ref 1.15–1.40)
Calcium, Ion: 1.13 mmol/L — ABNORMAL LOW (ref 1.15–1.40)
Calcium, Ion: 1.16 mmol/L (ref 1.15–1.40)
Calcium, Ion: 1.12 mmol/L — ABNORMAL LOW (ref 1.15–1.40)
HCT: 25 % — ABNORMAL LOW (ref 39.0–52.0)
HCT: 32 % — ABNORMAL LOW (ref 39.0–52.0)
HCT: 25 % — ABNORMAL LOW (ref 39.0–52.0)
HCT: 25 % — ABNORMAL LOW (ref 39.0–52.0)
HCT: 26 % — ABNORMAL LOW (ref 39.0–52.0)
HCT: 27 % — ABNORMAL LOW (ref 39.0–52.0)
Hemoglobin: 8.5 g/dL — ABNORMAL LOW (ref 13.0–17.0)
Hemoglobin: 10.9 g/dL — ABNORMAL LOW (ref 13.0–17.0)
Hemoglobin: 8.5 g/dL — ABNORMAL LOW (ref 13.0–17.0)
Hemoglobin: 8.5 g/dL — ABNORMAL LOW (ref 13.0–17.0)
Hemoglobin: 8.8 g/dL — ABNORMAL LOW (ref 13.0–17.0)
Hemoglobin: 9.2 g/dL — ABNORMAL LOW (ref 13.0–17.0)
O2 Saturation: 79 %
O2 Saturation: 78 %
O2 Saturation: 78 %
O2 Saturation: 81 %
O2 Saturation: 85 %
O2 Saturation: 80 %
Patient temperature: 37.3
Patient temperature: 37.2
Patient temperature: 37.3
Patient temperature: 37.2
Patient temperature: 37.2
Patient temperature: 37.5
Potassium: 3.6 mmol/L (ref 3.5–5.1)
Potassium: 3.4 mmol/L — ABNORMAL LOW (ref 3.5–5.1)
Potassium: 3.1 mmol/L — ABNORMAL LOW (ref 3.5–5.1)
Potassium: 2.9 mmol/L — ABNORMAL LOW (ref 3.5–5.1)
Potassium: 2.8 mmol/L — ABNORMAL LOW (ref 3.5–5.1)
Potassium: 3.5 mmol/L (ref 3.5–5.1)
Sodium: 139 mmol/L (ref 135–145)
Sodium: 139 mmol/L (ref 135–145)
Sodium: 142 mmol/L (ref 135–145)
Sodium: 142 mmol/L (ref 135–145)
Sodium: 143 mmol/L (ref 135–145)
Sodium: 141 mmol/L (ref 135–145)
TCO2: 37 mmol/L — ABNORMAL HIGH (ref 22–32)
TCO2: 36 mmol/L — ABNORMAL HIGH (ref 22–32)
TCO2: 35 mmol/L — ABNORMAL HIGH (ref 22–32)
TCO2: 39 mmol/L — ABNORMAL HIGH (ref 22–32)
TCO2: 38 mmol/L — ABNORMAL HIGH (ref 22–32)
TCO2: 37 mmol/L — ABNORMAL HIGH (ref 22–32)
pCO2 arterial: 53.9 mmHg — ABNORMAL HIGH (ref 32–48)
pCO2 arterial: 50.6 mmHg — ABNORMAL HIGH (ref 32–48)
pCO2 arterial: 47.7 mmHg (ref 32–48)
pCO2 arterial: 53.5 mmHg — ABNORMAL HIGH (ref 32–48)
pCO2 arterial: 53.3 mmHg — ABNORMAL HIGH (ref 32–48)
pCO2 arterial: 52.1 mmHg — ABNORMAL HIGH (ref 32–48)
pH, Arterial: 7.424 (ref 7.35–7.45)
pH, Arterial: 7.445 (ref 7.35–7.45)
pH, Arterial: 7.458 — ABNORMAL HIGH (ref 7.35–7.45)
pH, Arterial: 7.454 — ABNORMAL HIGH (ref 7.35–7.45)
pH, Arterial: 7.444 (ref 7.35–7.45)
pH, Arterial: 7.446 (ref 7.35–7.45)
pO2, Arterial: 45 mmHg — ABNORMAL LOW (ref 83–108)
pO2, Arterial: 42 mmHg — ABNORMAL LOW (ref 83–108)
pO2, Arterial: 42 mmHg — ABNORMAL LOW (ref 83–108)
pO2, Arterial: 45 mmHg — ABNORMAL LOW (ref 83–108)
pO2, Arterial: 49 mmHg — ABNORMAL LOW (ref 83–108)
pO2, Arterial: 45 mmHg — ABNORMAL LOW (ref 83–108)

## 2024-06-03 LAB — BASIC METABOLIC PANEL WITH GFR
Anion gap: 13 (ref 5–15)
Anion gap: 16 — ABNORMAL HIGH (ref 5–15)
BUN: 46 mg/dL — ABNORMAL HIGH (ref 8–23)
BUN: 50 mg/dL — ABNORMAL HIGH (ref 8–23)
CO2: 31 mmol/L (ref 22–32)
CO2: 33 mmol/L — ABNORMAL HIGH (ref 22–32)
Calcium: 8.4 mg/dL — ABNORMAL LOW (ref 8.9–10.3)
Calcium: 8.2 mg/dL — ABNORMAL LOW (ref 8.9–10.3)
Chloride: 95 mmol/L — ABNORMAL LOW (ref 98–111)
Chloride: 94 mmol/L — ABNORMAL LOW (ref 98–111)
Creatinine, Ser: 0.68 mg/dL (ref 0.61–1.24)
Creatinine, Ser: 0.74 mg/dL (ref 0.61–1.24)
GFR, Estimated: >60 mL/min (ref >60)
GFR, Estimated: >60 mL/min (ref >60)
Glucose, Bld: 185 mg/dL — ABNORMAL HIGH (ref 70–99)
Glucose, Bld: 151 mg/dL — ABNORMAL HIGH (ref 70–99)
Potassium: 3.5 mmol/L (ref 3.5–5.1)
Potassium: 2.8 mmol/L — ABNORMAL LOW (ref 3.5–5.1)
Sodium: 139 mmol/L (ref 135–145)
Sodium: 143 mmol/L (ref 135–145)

## 2024-06-03 LAB — TYPE AND SCREEN
ABO/RH(D): O POS
Antibody Screen: NEGATIVE
Unit division: 0
Unit division: 0
Unit division: 0
Unit division: 0
Unit division: 0

## 2024-06-03 LAB — BPAM RBC
Blood Product Expiration Date: 202512032359
Blood Product Expiration Date: 202512032359
Blood Product Expiration Date: 202512032359
Blood Product Expiration Date: 202512032359
Blood Product Expiration Date: 202512062359
ISSUE DATE / TIME: 202511071248
ISSUE DATE / TIME: 202511071248
ISSUE DATE / TIME: 202511071248
ISSUE DATE / TIME: 202511081126
Unit Type and Rh: 5100
Unit Type and Rh: 5100
Unit Type and Rh: 5100
Unit Type and Rh: 5100
Unit Type and Rh: 5100

## 2024-06-03 LAB — GLUCOSE, CAPILLARY
Glucose-Capillary: 186 mg/dL — ABNORMAL HIGH (ref 70–99)
Glucose-Capillary: 194 mg/dL — ABNORMAL HIGH (ref 70–99)
Glucose-Capillary: 160 mg/dL — ABNORMAL HIGH (ref 70–99)
Glucose-Capillary: 153 mg/dL — ABNORMAL HIGH (ref 70–99)
Glucose-Capillary: 154 mg/dL — ABNORMAL HIGH (ref 70–99)

## 2024-06-03 LAB — LACTATE DEHYDROGENASE: LDH: 616 U/L — ABNORMAL HIGH (ref 98–192)

## 2024-06-03 LAB — CBC
HCT: 26.1 % — ABNORMAL LOW (ref 39.0–52.0)
Hemoglobin: 8.4 g/dL — ABNORMAL LOW (ref 13.0–17.0)
MCH: 31.6 pg (ref 26.0–34.0)
MCHC: 32.2 g/dL (ref 30.0–36.0)
MCV: 98.1 fL (ref 80.0–100.0)
Platelets: 161 K/uL (ref 150–400)
RBC: 2.66 MIL/uL — ABNORMAL LOW (ref 4.22–5.81)
RDW: 20.7 % — ABNORMAL HIGH (ref 11.5–15.5)
WBC: 10.6 K/uL — ABNORMAL HIGH (ref 4.0–10.5)
nRBC: 2.5 % — ABNORMAL HIGH (ref 0.0–0.2)

## 2024-06-03 LAB — TRIGLYCERIDES: Triglycerides: 181 mg/dL — ABNORMAL HIGH (ref <150)

## 2024-06-03 LAB — HEPATIC FUNCTION PANEL
ALT: 31 U/L (ref 0–44)
AST: 51 U/L — ABNORMAL HIGH (ref 15–41)
Albumin: 1.8 g/dL — ABNORMAL LOW (ref 3.5–5.0)
Alkaline Phosphatase: 130 U/L — ABNORMAL HIGH (ref 38–126)
Bilirubin, Direct: 0.5 mg/dL — ABNORMAL HIGH (ref 0.0–0.2)
Indirect Bilirubin: 0.6 mg/dL (ref 0.3–0.9)
Total Bilirubin: 1.1 mg/dL (ref 0.0–1.2)
Total Protein: 5.9 g/dL — ABNORMAL LOW (ref 6.5–8.1)

## 2024-06-03 LAB — CBC WITH DIFFERENTIAL/PLATELET
Abs Immature Granulocytes: 0.33 K/uL — ABNORMAL HIGH (ref 0.00–0.07)
Basophils Absolute: 0.1 K/uL (ref 0.0–0.1)
Basophils Relative: 0 %
Eosinophils Absolute: 0.1 K/uL (ref 0.0–0.5)
Eosinophils Relative: 1 %
HCT: 26.1 % — ABNORMAL LOW (ref 39.0–52.0)
Hemoglobin: 8.5 g/dL — ABNORMAL LOW (ref 13.0–17.0)
Immature Granulocytes: 2 %
Lymphocytes Relative: 6 %
Lymphs Abs: 1.1 K/uL (ref 0.7–4.0)
MCH: 32.2 pg (ref 26.0–34.0)
MCHC: 32.6 g/dL (ref 30.0–36.0)
MCV: 98.9 fL (ref 80.0–100.0)
Monocytes Absolute: 1 K/uL (ref 0.1–1.0)
Monocytes Relative: 6 %
Neutro Abs: 15.8 K/uL — ABNORMAL HIGH (ref 1.7–7.7)
Neutrophils Relative %: 85 %
Platelets: 174 K/uL (ref 150–400)
RBC: 2.64 MIL/uL — ABNORMAL LOW (ref 4.22–5.81)
RDW: 20.6 % — ABNORMAL HIGH (ref 11.5–15.5)
WBC: 18.4 K/uL — ABNORMAL HIGH (ref 4.0–10.5)
nRBC: 2.7 % — ABNORMAL HIGH (ref 0.0–0.2)

## 2024-06-03 LAB — APTT
aPTT: 70 s — ABNORMAL HIGH (ref 24–36)
aPTT: 77 s — ABNORMAL HIGH (ref 24–36)

## 2024-06-03 LAB — CG4 I-STAT (LACTIC ACID)
Lactic Acid, Venous: 2.1 mmol/L (ref 0.5–1.9)
Lactic Acid, Venous: 2.2 mmol/L (ref 0.5–1.9)
Lactic Acid, Venous: 2.4 mmol/L (ref 0.5–1.9)
Lactic Acid, Venous: 3.1 mmol/L (ref 0.5–1.9)
Lactic Acid, Venous: 2.7 mmol/L (ref 0.5–1.9)

## 2024-06-03 LAB — MAGNESIUM: Magnesium: 1.9 mg/dL (ref 1.7–2.4)

## 2024-06-03 LAB — FIBRINOGEN: Fibrinogen: 602 mg/dL — ABNORMAL HIGH (ref 210–475)

## 2024-06-03 LAB — PROTIME-INR
INR: 1.5 — ABNORMAL HIGH (ref 0.8–1.2)
Prothrombin Time: 19 s — ABNORMAL HIGH (ref 11.4–15.2)

## 2024-06-03 LAB — PHOSPHORUS: Phosphorus: 5.3 mg/dL — ABNORMAL HIGH (ref 2.5–4.6)

## 2024-06-03 MED ORDER — VALPROIC ACID 250 MG/5ML PO SOLN
500.0000 mg | Freq: Every day | ORAL | Status: DC
  Administered 2024-06-03 – 2024-06-04 (×2): 500 mg
  Filled 2024-06-03 (×2): qty 10

## 2024-06-03 MED ORDER — INSULIN ASPART 100 UNIT/ML IJ SOLN
3.0000 [IU] | INTRAMUSCULAR | Status: DC
  Administered 2024-06-03 – 2024-06-04 (×8): 3 [IU] via SUBCUTANEOUS
  Filled 2024-06-03 (×8): qty 3

## 2024-06-03 MED ORDER — OXYCODONE HCL 5 MG PO TABS
10.0000 mg | ORAL_TABLET | Freq: Four times a day (QID) | ORAL | Status: DC
  Administered 2024-06-03: 10 mg
  Filled 2024-06-03: qty 2

## 2024-06-03 MED ORDER — VALPROIC ACID 250 MG/5ML PO SOLN
1000.0000 mg | Freq: Every day | ORAL | Status: DC
  Administered 2024-06-03: 1000 mg
  Filled 2024-06-03 (×2): qty 20

## 2024-06-03 MED ORDER — CAPTOPRIL 12.5 MG PO TABS
6.2500 mg | ORAL_TABLET | Freq: Three times a day (TID) | ORAL | Status: DC
  Administered 2024-06-03: 6.25 mg
  Filled 2024-06-03: qty 0.5

## 2024-06-03 MED ORDER — ARTIFICIAL TEARS OPHTHALMIC OINT
TOPICAL_OINTMENT | Freq: Every evening | OPHTHALMIC | Status: DC | PRN
  Administered 2024-06-03: 1 via OPHTHALMIC
  Filled 2024-06-03 (×2): qty 3.5

## 2024-06-03 MED ORDER — GABAPENTIN 250 MG/5ML PO SOLN
600.0000 mg | Freq: Every day | ORAL | Status: DC
  Administered 2024-06-03 – 2024-06-04 (×2): 600 mg
  Filled 2024-06-03 (×2): qty 12

## 2024-06-03 MED ORDER — MAGNESIUM SULFATE 2 GM/50ML IV SOLN
2.0000 g | Freq: Once | INTRAVENOUS | Status: AC
  Administered 2024-06-03: 2 g via INTRAVENOUS
  Filled 2024-06-03: qty 50

## 2024-06-03 MED ORDER — SENNOSIDES-DOCUSATE SODIUM 8.6-50 MG PO TABS
2.0000 | ORAL_TABLET | Freq: Every day | ORAL | Status: DC
Start: 2024-06-03 — End: 2024-06-03

## 2024-06-03 MED ORDER — VALPROIC ACID 250 MG/5ML PO SOLN
250.0000 mg | Freq: Every day | ORAL | Status: DC
  Administered 2024-06-04: 250 mg
  Filled 2024-06-03 (×2): qty 5

## 2024-06-03 MED ORDER — POLYETHYLENE GLYCOL 3350 17 G PO PACK: 17.0000 g | PACK | Freq: Every day | ORAL | Status: DC

## 2024-06-03 MED ORDER — METOLAZONE 5 MG PO TABS
5.0000 mg | ORAL_TABLET | Freq: Once | ORAL | Status: AC
  Administered 2024-06-03: 5 mg
  Filled 2024-06-03: qty 1

## 2024-06-03 MED ORDER — GABAPENTIN 250 MG/5ML PO SOLN
1200.0000 mg | Freq: Every day | ORAL | Status: DC
  Administered 2024-06-03: 1200 mg
  Filled 2024-06-03 (×2): qty 24

## 2024-06-03 MED ORDER — CLONAZEPAM 1 MG PO TABS
1.0000 mg | ORAL_TABLET | Freq: Three times a day (TID) | ORAL | Status: DC
  Administered 2024-06-03 – 2024-06-04 (×4): 1 mg
  Filled 2024-06-03 (×4): qty 1

## 2024-06-03 MED ORDER — POTASSIUM CHLORIDE 20 MEQ PO PACK
40.0000 meq | PACK | Freq: Once | ORAL | Status: AC
  Administered 2024-06-03: 40 meq
  Filled 2024-06-03: qty 2

## 2024-06-03 MED ORDER — HYDRALAZINE HCL 20 MG/ML IJ SOLN: 10.0000 mg | Freq: Four times a day (QID) | INTRAMUSCULAR | Status: DC | PRN

## 2024-06-03 MED ORDER — CAPTOPRIL 12.5 MG PO TABS
6.2500 mg | ORAL_TABLET | Freq: Three times a day (TID) | ORAL | Status: DC
Start: 2024-06-03 — End: 2024-06-03
  Filled 2024-06-03: qty 0.5

## 2024-06-03 MED ORDER — METOPROLOL TARTRATE 12.5 MG HALF TABLET
12.5000 mg | ORAL_TABLET | Freq: Three times a day (TID) | ORAL | Status: DC
  Administered 2024-06-03: 12.5 mg
  Filled 2024-06-03 (×2): qty 1

## 2024-06-03 MED ORDER — LACTULOSE 10 GM/15ML PO SOLN: 30.0000 g | Freq: Every day | ORAL | Status: DC | PRN

## 2024-06-03 MED ORDER — STERILE WATER FOR INJECTION IJ SOLN
INTRAMUSCULAR | Status: AC
  Administered 2024-06-03: 2.1 mL
  Filled 2024-06-03: qty 10

## 2024-06-03 MED ORDER — DEXMEDETOMIDINE HCL IN NACL 400 MCG/100ML IV SOLN
0.0000 ug/kg/h | INTRAVENOUS | Status: DC
  Administered 2024-06-03: 0.4 ug/kg/h via INTRAVENOUS
  Administered 2024-06-03: 0.6 ug/kg/h via INTRAVENOUS
  Administered 2024-06-04: 0.7 ug/kg/h via INTRAVENOUS
  Administered 2024-06-04: 1 ug/kg/h via INTRAVENOUS
  Administered 2024-06-04: 1.1 ug/kg/h via INTRAVENOUS
  Administered 2024-06-04: 0.9 ug/kg/h via INTRAVENOUS
  Filled 2024-06-03 (×6): qty 100

## 2024-06-03 MED ORDER — OXYCODONE HCL 5 MG PO TABS
15.0000 mg | ORAL_TABLET | Freq: Four times a day (QID) | ORAL | Status: DC
  Administered 2024-06-03 – 2024-06-04 (×6): 15 mg
  Filled 2024-06-03 (×6): qty 3

## 2024-06-03 MED ORDER — HYDROXYZINE HCL 10 MG PO TABS: 10.0000 mg | ORAL_TABLET | Freq: Three times a day (TID) | ORAL | Status: DC | PRN

## 2024-06-03 NOTE — CV Procedure (Signed)
 ECMO NOTE:   Indication: Respiratory failure due to ARDS   Initial cannulation date: 05/22/24   ECMO type: VV ECMO   Dual lumen inflow/return cannula:   1) 64F [place] placed in RIJ    Daily data:   Flow 5.21 RPM 3850 Sweep  11L Pven -97   Labs:   ABG    Component Value Date/Time   PHART 7.424 06/03/2024 0415   PCO2ART 53.9 (H) 06/03/2024 0415   PO2ART 45 (L) 06/03/2024 0415   HCO3 35.2 (H) 06/03/2024 0415   TCO2 37 (H) 06/03/2024 0415   ACIDBASEDEF 3.0 (H) 06/01/2024 1053   O2SAT 79 06/03/2024 0415    Hgb 8.5 Platelets 174 LDH 616 PTT 70 goal of 70-80.   Plan:   Continue VV ECMO support Await lung recovery, expect prolonged course No significant fibrosis on chest CT per CCM Circuit change 11/4 for reduced oxygenator efficiency Circuit change 11/8 for air entrainment Continue diuresis with Lasix drip at 8, metolazone 5 mg once, good diuresis yesterday Continue antifungals, completed course of meropenem for HAP Continue to wean sedation, now on dilaudid  alone, responding to name, some movement Nitric oxide through the circuit Worsening oxygenation, borderline lactic acid. If worsening can consider repeat TEE Small deposits at 3 and 9 with recent circuit change Wean off cleviprex, start oral captopril   Morene JINNY Brownie, MD  10:13 AM

## 2024-06-03 NOTE — Progress Notes (Signed)
 Patient ID: Keith Gibbs, male   DOB: 04/26/1961, 63 y.o.   MRN: [id number]    Advanced Heart Failure Rounding Note   Subjective:    Placed on VV ECMO 05/22/2024  Hypertensive, now on cleviprex. Sedation has been greatly weaned.  Circuit looks good, small build up noted.    Objective:   Weight Range:  Vital Signs:   Temp:  [98.8 F (37.1 C)-99.7 F (37.6 C)] 99 F (37.2 C) (11/10 0930) Pulse Rate:  [59-87] 85 (11/10 0930) Resp:  [0-35] 28 (11/10 0930) BP: (91-160)/(56-73) 94/59 (11/10 0827) SpO2:  [71 %-89 %] 78 % (11/10 0930) Arterial Line BP: (99-174)/(36-82) 126/47 (11/10 0930) FiO2 (%):  [80 %-100 %] 100 % (11/10 0827) Weight:  [116.8 kg] 116.8 kg (11/10 0500) Last BM Date : 06/02/24  Weight change: Filed Weights   06/01/24 0500 06/02/24 0500 06/03/24 0500  Weight: 119 kg 115.8 kg 116.8 kg    Intake/Output:   Intake/Output Summary (Last 24 hours) at 06/03/2024 1017 Last data filed at 06/03/2024 1000 Gross per 24 hour  Intake 3962.39 ml  Output 7570 ml  Net -3607.61 ml     Physical Exam: GENERAL: intubated PULM:  Ventilated breath sounds CARDIAC:  JVP: mildly elevated, RIJ VV cannula in place, dressing C/d/i        RRR, trace edema ABDOMEN: Soft, non-tender, non-distended. NEUROLOGIC: responds to voice, weak   Labs: Basic Metabolic Panel: Recent Labs  Lab 05/31/24 0427 05/31/24 0437 06/01/24 0347 06/01/24 0459 06/01/24 1619 06/01/24 1629 06/02/24 0420 06/02/24 0742 06/02/24 1555 06/02/24 1605 06/02/24 1735 06/02/24 2006 06/02/24 2204 06/03/24 0410 06/03/24 0415  NA 138   < > 136   < > 137   < > 137   < > 136   < > 137 139 137 139 139  K 4.2   < > 3.3*   < > 3.9   < > 4.3   < > 4.2   < > 4.2 3.8 4.1 3.5 3.6  CL 107   < > 98  --  96*  --  97*  --  97*  --   --   --   --  95*  --   CO2 22   < > 25  --  29  --  27  --  30  --   --   --   --  31  --   GLUCOSE 151*   < > 149*  --  139*  --  164*  --  154*  --   --   --   --  185*  --    BUN 36*   < > 37*  --  38*  --  46*  --  45*  --   --   --   --  46*  --   CREATININE 0.57*   < > 0.70  --  0.73  --  0.71  --  0.73  --   --   --   --  0.68  --   CALCIUM  7.8*   < > 8.4*  --  7.9*  --  8.0*  --  8.1*  --   --   --   --  8.4*  --   MG 2.0  --  1.8  --  2.0  --  2.1  --   --   --   --   --   --  1.9  --   PHOS 4.4  --  5.1*  --  5.7*  --  6.1*  --   --   --   --   --   --  5.3*  --    < > = values in this interval not displayed.    Liver Function Tests: Recent Labs  Lab 05/30/24 0420 05/31/24 0427 06/01/24 0347 06/02/24 0420 06/03/24 0410  AST 35 35 47* 54* 51*  ALT 46* 35 32 35 31  ALKPHOS 166* 145* 140* 133* 130*  BILITOT 1.2 1.0 0.9 1.1 1.1  PROT 5.4* 5.4* 5.5* 5.6* 5.9*  ALBUMIN  2.0* 1.8* 1.8* 1.9* 1.8*   No results for input(s): LIPASE, AMYLASE in the last 168 hours. No results for input(s): AMMONIA in the last 168 hours.  CBC: Recent Labs  Lab 05/30/24 0420 05/30/24 1205 05/31/24 0427 05/31/24 0437 06/01/24 0347 06/01/24 0459 06/01/24 1047 06/01/24 1053 06/01/24 1619 06/01/24 1629 06/02/24 0420 06/02/24 0742 06/02/24 1555 06/02/24 1605 06/02/24 1735 06/02/24 2006 06/02/24 2204 06/03/24 0410 06/03/24 0415  WBC 12.2*   < > 10.5   < > 11.4*  --  11.1*  --  17.5*  --  12.8*  --  20.9*  --   --   --   --  18.4*  --   NEUTROABS 7.6  --  6.5  --  7.8*  --   --   --   --   --  10.0*  --   --   --   --   --   --  15.8*  --   HGB 8.0*  7.8*   < > 7.6*   < > 7.8*   < > 6.9*   < > 8.3*   < > 7.8*   < > 8.4*   < > 8.2* 11.2* 8.2* 8.5* 8.5*  HCT 25.3*  23.0*   < > 23.8*   < > 23.3*   < > 21.6*   < > 24.7*   < > 24.1*   < > 26.2*   < > 24.0* 33.0* 24.0* 26.1* 25.0*  MCV 103.3*   < > 102.1*   < > 99.1  --  101.4*  --  95.4  --  99.2  --  100.0  --   --   --   --  98.9  --   PLT 188   < > 164   < > 145*  --  133*  --  161  --  146*  --  182  --   --   --   --  174  --    < > = values in this interval not displayed.    Cardiac Enzymes: No  results for input(s): CKTOTAL, CKMB, CKMBINDEX, TROPONINI in the last 168 hours.  BNP: BNP (last 3 results) Recent Labs    03/20/24 1419  BNP 20.6    ProBNP (last 3 results) No results for input(s): PROBNP in the last 8760 hours.      Medications:     Scheduled Medications:  arformoterol  15 mcg Nebulization BID   calcium  carbonate  1 tablet Per Tube BID   captopril  6.25 mg Per Tube TID   Chlorhexidine  Gluconate Cloth  6 each Topical Daily   clonazePAM  1 mg Per Tube Q8H   feeding supplement (PROSource TF20)  60 mL Per Tube QID   fiber supplement (BANATROL TF)  60 mL Per Tube BID   free water   100 mL Per Tube Q4H   gabapentin   600 mg Per Tube Daily   And   gabapentin   1,200 mg Per Tube QHS   Gerhardt's butt cream   Topical BID   insulin aspart  0-15 Units Subcutaneous Q4H   OLANZapine   5 mg Intravenous Q8H   mouth rinse  15 mL Mouth Rinse Q2H   oxyCODONE   10 mg Per Tube QID   pantoprazole  (PROTONIX ) IV  40 mg Intravenous QHS   potassium chloride   40 mEq Per Tube BID   revefenacin  175 mcg Nebulization Daily   sodium chloride  flush  10-40 mL Intracatheter Q12H   sodium chloride  flush  10-40 mL Intracatheter Q12H    Infusions:  albumin  human Stopped (05/29/24 2308)   bivalirudin  (ANGIOMAX ) 250 mg in sodium chloride  0.9 % 500 mL (0.5 mg/mL) infusion 0.11 mg/kg/hr (06/03/24 1000)   clevidipine Stopped (06/03/24 0934)   feeding supplement (VITAL 1.5 CAL) 65 mL/hr at 06/03/24 1000   furosemide (LASIX) 200 mg in dextrose  5 % 100 mL (2 mg/mL) infusion 8 mg/hr (06/03/24 1000)   HYDROmorphone  Stopped (06/03/24 0958)   magnesium  sulfate bolus IVPB     micafungin (MYCAMINE) 200 mg in sodium chloride  0.9 % 100 mL IVPB Stopped (06/02/24 1827)   midazolam  Stopped (06/02/24 1513)   norepinephrine (LEVOPHED) Adult infusion 2 mcg/min (06/03/24 1000)   propofol  (DIPRIVAN ) infusion Stopped (06/02/24 1421)   vasopressin  Stopped (06/02/24 1435)    PRN  Medications: albumin  human, hydrALAZINE , HYDROmorphone , hydrOXYzine , ipratropium-albuterol , lactulose, midazolam , ondansetron  (ZOFRAN ) IV, mouth rinse, mouth rinse, sodium chloride  flush, sodium chloride  flush   Assessment/Plan:   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 - s/p VV ECMO cannulation 05/22/24 - VV ECMO flows look good, significant fibrin buildup on previous oxygenator - S/p circuit change on 11/4, improvement in post gas from 151 to 540  - S/p circuit change 11/8 for air entrainment - Bivalirudin  goal PTT of 70-80, discussed goals with pharmacy - Nitric oxide through the circuit for anticoagulation and long pump run - Will try to wean sedation today and slowly awaken patient, can wean flows to do so - TEE with cannula adjustment 11/3 - Completed course of meropenem on 11/7 - TV slowly improving - Continue diuresis with Lasix drip at 8 and metolazone 5 mg - CCM managing vent and R chest tube - Trach 11/3, appreciate ENT - Continue micafungin - Transfuse hgb < 7.5  Shock: - Primarily septic/vasodilatory.  Resolved  Hypertension: - Wean off cleviprex, suspect sedation related. Start low dose captopril  Bradycardia: - Resolved   RUL lung CA s/p lobectomy COPD with ongoing tobacco use Bipolar d/o CAD Candida glabrata fungemia/PNA - ID following - on micafungin for fungemia, ID following, course to be determined given indwelling lines - steroids off - Completed meropenem 11/7  CHF/HFpEF - Good diuresis yesterday with Lasix gtt 8 mg/h and metolazone, repeat today  Remains critically ill with ARDS/fungal PNA/PTX on top of severe underlying lung disease. Long run expected  CRITICAL CARE Performed by: Morene JINNY Brownie   Total critical care time: 45 minutes  Critical care time was exclusive of separately billable procedures and treating other patients.  Critical care was necessary to treat or prevent imminent or  life-threatening deterioration.  Critical care was time spent personally by me on the following activities: development of treatment plan with patient and/or surrogate as well as nursing, discussions with consultants, evaluation of patient's response to treatment, examination of patient, obtaining history from patient or surrogate, ordering and performing treatments and interventions,  ordering and review of laboratory studies, ordering and review of radiographic studies, pulse oximetry and re-evaluation of patient's condition.

## 2024-06-03 NOTE — Progress Notes (Signed)
 PHARMACY - ANTICOAGULATION CONSULT NOTE  Pharmacy Consult for heparin  >> bivalirudin  Indication: VTE, VV ECMO (10/29 - current)  Allergies  Allergen Reactions   Asenapine Other (See Comments) and Nausea And Vomiting    Increased tremors   Dextromethorphan Hbr    Guaifenesin     Latuda [Lurasidone Hcl] Other (See Comments)    Tremors     Lurasidone Other (See Comments) and Hives    Other Reaction(s): Angioedema   Phenylephrine      Patient Measurements: Height: 6' 3 (190.5 cm) Weight: 116.8 kg (257 lb 8 oz) IBW/kg (Calculated) : 84.5  Vital Signs: Temp: 98.8 F (37.1 C) (11/10 0715) Temp Source: Bladder (11/10 0400) BP: 102/56 (11/10 0400) Pulse Rate: 83 (11/10 0715)  Labs: Recent Labs    06/01/24 0347 06/01/24 0459 06/02/24 0420 06/02/24 0742 06/02/24 1555 06/02/24 1605 06/02/24 2204 06/03/24 0410 06/03/24 0415  HGB 7.8*   < > 7.8*   < > 8.4*   < > 8.2* 8.5* 8.5*  HCT 23.3*   < > 24.1*   < > 26.2*   < > 24.0* 26.1* 25.0*  PLT 145*   < > 146*  --  182  --   --  174  --   APTT 71*   < > 69*  --  70*  --   --  70*  --   LABPROT 17.7*  --  17.7*  --   --   --   --  19.0*  --   INR 1.4*  --  1.4*  --   --   --   --  1.5*  --   CREATININE 0.70   < > 0.71  --  0.73  --   --  0.68  --    < > = values in this interval not displayed.    Estimated Creatinine Clearance: 130.2 mL/min (by C-G formula based on SCr of 0.68 mg/dL).   Assessment: 72 yoM admitted for RUL lobectomy.  Pt intubated, now having difficulty with ventilation. Placed on VV ECMO 10/29 in the evening. Pharmacy to dose IV bivalirudin .   Previously on therapeutic heparin  (HL 0.33 on 1700/h) for acute DVT of gastrocnemius vein  > changed to bivalirudin  with ECMO cannulation  S/p circuit change 11/4 S/p circuit exchange 11/8  aPTT remains therapeutic at 70, on bivalirudin  0.11mg /kg/hr. No s/sx of bleeding or infusion issues/ CBC stable - Hgb 8.5, pl 174, fibrinogen 602, LDH 616. Still small amount of  build up in circuit.   Goal of Therapy:  aPTT goal increased to 70-80 sec on 11/7 Monitor platelets by anticoagulation protocol: Yes   Plan:  Continue bivalirudin  0.11 mg/kg/hr Q12h aPTT and CBC  Thank you for allowing pharmacy to participate in this patient's care,  Suzen Sour, PharmD, BCCCP Clinical Pharmacist  Phone: [phone] 06/03/2024 8:04 AM  Please check AMION for all [facility] Pharmacy phone numbers After 10:00 PM, call Main Pharmacy [phone]

## 2024-06-03 NOTE — Progress Notes (Signed)
   Palliative Medicine Inpatient Follow Up Note HPI: 63 y.o. male  with past medical history of COPD, former tobacco use, CAD, hepatitis C, HTN, HLD, bipolar 1 disorder, and schizophrenia.  He was admitted on 05/09/2024 for lobectomy of RUL nodule.  He was initially progressing as expected postoperatively, however on 10/21 he decompensated with worsening hypoxemia.  He ultimately went into ARDS and was cannulated for VV ECMO on 10/29.   Palliative Medicine has been consulted for goals of care discussions and complex medical decision making.   Today's Discussion 06/03/2024  *Please note that this is a verbal dictation therefore any spelling or grammatical errors are due to the Dragon [employer] One system interpretation.  Chart reviewed inclusive of vital signs, progress notes, laboratory results, and diagnostic images.   (+) HTN, continued weaning of sedation  I met with Acel at bedside this morning in the company of Dr. Toma.   The ECMO team shared that Daoud has increased on supportive measures such as ECMO, ventilatory support, and nitrous oxide.   Have called patients mother, Slater and left a HIPAA compliant VM.  The PMT will continue to remain involved to offer ongoing support as needed.   Objective Assessment: Vital Signs Vitals:   06/03/24 0915 06/03/24 0930  BP:    Pulse: 83 85  Resp: (!) 34 (!) 28  Temp: 99 F (37.2 C) 99 F (37.2 C)  SpO2: (!) 84% (!) 78%    Intake/Output Summary (Last 24 hours) at 06/03/2024 1114 Last data filed at 06/03/2024 1000 Gross per 24 hour  Intake 3797.42 ml  Output 7345 ml  Net -3547.58 ml   Last Weight  Most recent update: 06/03/2024  5:28 AM    Weight  116.8 kg (257 lb 8 oz)            Gen:  Older critically ill appearing Caucasian M HEENT: Dry mucous membranes, Tracheostomy in place CV: Regular rate and rhythm  PULM: On mechanical ventilator ABD: soft/nontender  EXT: (+) Trace peripheral edema  Neuro:  Sedation  SUMMARY OF RECOMMENDATIONS   DNAR w/ interventions  ECMO as a bridge to recovery  Continue current care  PMT will continue to follow along as needed ______________________________________________________________________________________ Rosaline Esequ[facility] Palliative Medicine Team Team Cell Phone: [phone] Please utilize secure chat with additional questions, if there is no response within 30 minutes please call the above phone number  Time Spent:33  Palliative Medicine Team providers are available by phone from 7am to 7pm daily and can be reached through the team cell phone.  Should this patient require assistance outside of these hours, please call the patient's attending physician.

## 2024-06-03 NOTE — Progress Notes (Signed)
 PHARMACY - ANTICOAGULATION CONSULT NOTE  Pharmacy Consult for heparin  >> bivalirudin  Indication: VTE, VV ECMO (10/29 - current)  Allergies  Allergen Reactions   Asenapine Other (See Comments) and Nausea And Vomiting    Increased tremors   Dextromethorphan Hbr    Guaifenesin     Latuda [Lurasidone Hcl] Other (See Comments)    Tremors     Lurasidone Other (See Comments) and Hives    Other Reaction(s): Angioedema   Phenylephrine      Patient Measurements: Height: 6' 3 (190.5 cm) Weight: 116.8 kg (257 lb 8 oz) IBW/kg (Calculated) : 84.5  Vital Signs: Temp: 99 F (37.2 C) (11/10 1700) BP: 114/50 (11/10 1520) Pulse Rate: 84 (11/10 1700)  Labs: Recent Labs    06/01/24 0347 06/01/24 0459 06/02/24 0420 06/02/24 0742 06/02/24 1555 06/02/24 1605 06/03/24 0410 06/03/24 0415 06/03/24 1406 06/03/24 1644 06/03/24 1649  HGB 7.8*   < > 7.8*   < > 8.4*   < > 8.5*   < > 8.5* 8.4* 8.8*  HCT 23.3*   < > 24.1*   < > 26.2*   < > 26.1*   < > 25.0* 26.1* 26.0*  PLT 145*   < > 146*  --  182  --  174  --   --  161  --   APTT 71*   < > 69*  --  70*  --  70*  --   --  77*  --   LABPROT 17.7*  --  17.7*  --   --   --  19.0*  --   --   --   --   INR 1.4*  --  1.4*  --   --   --  1.5*  --   --   --   --   CREATININE 0.70   < > 0.71  --  0.73  --  0.68  --   --   --   --    < > = values in this interval not displayed.    Estimated Creatinine Clearance: 130.2 mL/min (by C-G formula based on SCr of 0.68 mg/dL).   Assessment: 44 yoM admitted for RUL lobectomy.  Pt intubated, now having difficulty with ventilation. Placed on VV ECMO 10/29 in the evening. Pharmacy to dose IV bivalirudin .   Previously on therapeutic heparin  (HL 0.33 on 1700/h) for acute DVT of gastrocnemius vein  > changed to bivalirudin  with ECMO cannulation  S/p circuit change 11/4 S/p circuit exchange 11/8  aPTT remains therapeutic at 77, on bivalirudin  0.11mg /kg/hr. No s/sx of bleeding or infusion issues.  Goal of  Therapy:  aPTT goal increased to 70-80 sec on 11/7 Monitor platelets by anticoagulation protocol: Yes   Plan:  Continue bivalirudin  0.11 mg/kg/hr Q12h aPTT and CBC  Thank you for allowing pharmacy to participate in this patient's care,  Larraine Brazier, PharmD Clinical Pharmacist 06/03/2024  5:56 PM **Pharmacist phone directory can now be found on [email or web address] ([email or web address]).  Listed under [facility].

## 2024-06-03 NOTE — Inpatient Diabetes Management (Signed)
 Inpatient Diabetes Program Recommendations  AACE/ADA: New Consensus Statement on Inpatient Glycemic Control (2015)  Target Ranges:  Prepandial:   less than 140 mg/dL      Peak postprandial:   less than 180 mg/dL (1-2 hours)      Critically ill patients:  140 - 180 mg/dL   Lab Results  Component Value Date   GLUCAP 194 (H) 06/03/2024   HGBA1C 5.3 02/12/2024    Latest Reference Range & Units 06/02/24 07:41 06/02/24 11:28 06/02/24 16:03 06/02/24 19:49 06/02/24 23:46 06/03/24 04:13 06/03/24 08:25  Glucose-Capillary 70 - 99 mg/dL 839 (H) 796 (H) 841 (H) 163 (H) 223 (H) 186 (H) 194 (H)   Review of Glycemic Control  Diabetes history: None  Outpatient Diabetes medications: None  Current orders for Inpatient glycemic control:  Novolog 0-15 units Q4HRS   Inpatient Diabetes Program Recommendations:   Noted: ECMO, Trach, and Tube feeds.   Tube feeds Vital 1.5 CAL @ goal 65 ml/hr   Patient has received 25 units of Novolog over the past 24 hours for correction.   Insulin: Please consider starting Tube Feed Coverage, Novolog 3 units Q4HRS.   Thanks,  Lavanda Search, RN, MSN, [facility]  Inpatient Diabetes Coordinator  Pager [phone] (8a-5p)

## 2024-06-03 NOTE — Progress Notes (Addendum)
 NAME:  Keith Gibbs, MRN:  [id number], DOB:  [birth year], LOS: 25 ADMISSION DATE:  05/09/2024, CONSULTATION DATE: 05/14/2024 REFERRING MD: CTS, CHIEF COMPLAINT: Acute on chronic hypoxic respiratory failure  History of Present Illness:  63 yo male presented for lobectomy with CTS. Progressing as expected post operatively with CT in place and was on 4E when this morning he decompensated with worsening hypoxemia and increased wob. CTS transferred to ICU and requested CCM evaluation.   Patient went into ARDS and eventually cannulated for VV ECMO.  Pertinent  Medical History  RUL nodule s/p RUL lobectomy Bipolar 1 d/o Schizophrenia Polysubstance abuse Cad Mild to mod Ao insuff Htn Hyperlipidemia Tobacco use COPD Hep C  Significant Hospital Events: Including procedures, antibiotic start and stop dates in addition to other pertinent events   Admitted 10/16 for RUL Lobectomy Transferred to ICU 10/21  10/24 started zosyn 10/26 - Intubated.  He was difficult to oxygenate and ventilate therefore APRV started.  Started meropenem, azithromycin , cefepime, & vanc 10/27 con't vanc & mero 10/29 -nitric oxide weaned off overnight-did not change his respiratory status.  Proned. 10/29 -worsened despite proning > cannulated for VV ECMO.  Started micafungin empirically, con't previous abd- vanc & mero 10/30 -blood cultures from 05/19/2024 and BAL cultures from 05/19/2024 grew Candida glabrata 10/31 - 11/2 unable to get the patient off paralytics  11/3 tracheostomy (ENT), weaned off nimbex, TEE with repositioning of crescent cannula 11/4 weaned off ketamine , circuit exchanged at beside for failing oxygenator, increasing vasopressor requirements and new bradycardia 11/8 air entrainment, emergency circuit exchange  Interim History / Subjective:  Diuresing well. More awake this morning. Weaned off vasopressors with lightening sedation   Objective    Blood pressure (!) 94/59, pulse 84, temperature  99 F (37.2 C), resp. rate (!) 30, height 6' 3 (1.905 m), weight 116.8 kg, SpO2 (!) 80%.    Vent Mode: PCV FiO2 (%):  [80 %-100 %] 100 % Set Rate:  [15 bmp-25 bmp] 25 bmp PEEP:  [10 cmH20] 10 cmH20 Plateau Pressure:  [24 cmH20-29 cmH20] 29 cmH20   Intake/Output Summary (Last 24 hours) at 06/03/2024 0925 Last data filed at 06/03/2024 0830 Gross per 24 hour  Intake 3794.51 ml  Output 7545 ml  Net -3750.49 ml   Filed Weights   06/01/24 0500 06/02/24 0500 06/03/24 0500  Weight: 119 kg 115.8 kg 116.8 kg    Examination: General: critically ill appearing male, laying in bed, no acute distress HEENT: [place]/AT, left eye with conjunctival hemorrhage  Neck: trach in place Lungs: absent breath sounds, right chest tube without air leak Cardiovascular: regular rate and rhythm, normal S1 and S2 Abdomen: soft, non-distended Extremities: trace peripheral edema Neuro: RASS -4, pupils pinpoint and reactive   I/O -3.4L  Ventilator PC 15/15/10/100%, overbreathes in 20's with Vt 120-130's  VV ECMO Flow: 3850 RPM>  5.2 L with iNO via oxygenator Sweep: 11 L/min  P venous -95 Delta P 29  Resolved problem list  AKI Pseudomonas pneumonia Hypernatremia Hypokalemia  Assessment and Plan   Acute hypoxic and hypercapnic respiratory failure due to ARDS in the setting of candida glabrata and pseudomonas pneumonia s/p VV ECMO s/p tracheostomy 11/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 History of COPD Persistent air leak> now sealed -continue VV ECMO support; iNO via oxygenator -bival -goal flow ~5L; wean flow as needed and tolerated to facilitate weaning sedation  -titrate sweep for normal pH -continue micafungin -LTVV; ultralung protective ventilation -con't chest tube to water  seal, hopefully can  put to suction soon since BPF sealed -goal SpO2>80% -no steriods -bronchodilators- yupelri & brovana; avoid ICS -continue lasix gtt+ metolazone for goal negative as tolerated   Acute  encephalopathy due to sedation History of anxiety, schizoaffective disorder bipolar type  -sedation as required to tolerate ECMO & MV -olanzapine , enteral oxy & clonazepam -PAD protocol with HM infusion decreasing 2 mg/hr  -holding PTA cymbalta  & depakote   Sinus bradycardia> suspect this is related to loss of sympathetic effects of ketamine  Hypotension -avoiding precedex  -NE PRN for MAP<65  Hypertension when sedation lightened -SBP goal<160 -start captopril 6.25 mg TID -wean cleviprex  -hydralazine  10-20mg  Q6h PRN -avoiding Bblocker due to bradycardia when sedated  Candida glabrata pneumonia & fungemia  Needs a longer course of antifungals given ECMO cannula placed prior to start of antifungals. -micafungin per ID, tentatively until 06/20/24 with plan for 4 week course. ID signed off for now, re-engage 11/25 if needed to extend antifungal coverage (see note 11/9) -eventually needs ophtho eval  Hyperphosphatemia, improving  -continue tums to bind  Elevated transaminases due to sepsis, improving  -monitor periodically  RLE DVT  -bival, needs treatment for minimum 3 months  Anemia in the setting of critical illness and acute blood loss -transfuse for Hb <8 or hemodynamically significant bleeding  Malnutrition  -TF at goal  Deconditioning -expect a long rehab course  GI prophylaxis: PPI  DVT prophylaxis: bival infusion   Plan discussed during ECMO rounds.   Labs   CBC: Recent Labs  Lab 05/30/24 0420 05/30/24 1205 05/31/24 0427 05/31/24 0437 06/01/24 0347 06/01/24 0459 06/01/24 1047 06/01/24 1053 06/01/24 1619 06/01/24 1629 06/02/24 0420 06/02/24 0742 06/02/24 1555 06/02/24 1605 06/02/24 1735 06/02/24 2006 06/02/24 2204 06/03/24 0410 06/03/24 0415  WBC 12.2*   < > 10.5   < > 11.4*  --  11.1*  --  17.5*  --  12.8*  --  20.9*  --   --   --   --  18.4*  --   NEUTROABS 7.6  --  6.5  --  7.8*  --   --   --   --   --  10.0*  --   --   --   --   --   --   15.8*  --   HGB 8.0*  7.8*   < > 7.6*   < > 7.8*   < > 6.9*   < > 8.3*   < > 7.8*   < > 8.4*   < > 8.2* 11.2* 8.2* 8.5* 8.5*  HCT 25.3*  23.0*   < > 23.8*   < > 23.3*   < > 21.6*   < > 24.7*   < > 24.1*   < > 26.2*   < > 24.0* 33.0* 24.0* 26.1* 25.0*  MCV 103.3*   < > 102.1*   < > 99.1  --  101.4*  --  95.4  --  99.2  --  100.0  --   --   --   --  98.9  --   PLT 188   < > 164   < > 145*  --  133*  --  161  --  146*  --  182  --   --   --   --  174  --    < > = values in this interval not displayed.    Basic Metabolic Panel: Recent Labs  Lab 05/31/24 0427 05/31/24 0437 06/01/24 0347 06/01/24 0459 06/01/24 1619  06/01/24 1629 06/02/24 0420 06/02/24 0742 06/02/24 1555 06/02/24 1605 06/02/24 1735 06/02/24 2006 06/02/24 2204 06/03/24 0410 06/03/24 0415  NA 138   < > 136   < > 137   < > 137   < > 136   < > 137 139 137 139 139  K 4.2   < > 3.3*   < > 3.9   < > 4.3   < > 4.2   < > 4.2 3.8 4.1 3.5 3.6  CL 107   < > 98  --  96*  --  97*  --  97*  --   --   --   --  95*  --   CO2 22   < > 25  --  29  --  27  --  30  --   --   --   --  31  --   GLUCOSE 151*   < > 149*  --  139*  --  164*  --  154*  --   --   --   --  185*  --   BUN 36*   < > 37*  --  38*  --  46*  --  45*  --   --   --   --  46*  --   CREATININE 0.57*   < > 0.70  --  0.73  --  0.71  --  0.73  --   --   --   --  0.68  --   CALCIUM  7.8*   < > 8.4*  --  7.9*  --  8.0*  --  8.1*  --   --   --   --  8.4*  --   MG 2.0  --  1.8  --  2.0  --  2.1  --   --   --   --   --   --  1.9  --   PHOS 4.4  --  5.1*  --  5.7*  --  6.1*  --   --   --   --   --   --  5.3*  --    < > = values in this interval not displayed.   GFR: Estimated Creatinine Clearance: 130.2 mL/min (by C-G formula based on SCr of 0.68 mg/dL). Recent Labs  Lab 06/01/24 1619 06/02/24 0420 06/02/24 1328 06/02/24 1555 06/02/24 1612 06/02/24 2219 06/03/24 0410 06/03/24 0420  WBC 17.5* 12.8*  --  20.9*  --   --  18.4*  --   LATICACIDVEN  --   --  1.2  --  1.0  2.2*  --  2.1*    Liver Function Tests: Recent Labs  Lab 05/30/24 0420 05/31/24 0427 06/01/24 0347 06/02/24 0420 06/03/24 0410  AST 35 35 47* 54* 51*  ALT 46* 35 32 35 31  ALKPHOS 166* 145* 140* 133* 130*  BILITOT 1.2 1.0 0.9 1.1 1.1  PROT 5.4* 5.4* 5.5* 5.6* 5.9*  ALBUMIN  2.0* 1.8* 1.8* 1.9* 1.8*   The patient is critically ill with multiple organ system failure and requires high complexity decision making for assessment and support, frequent evaluation and titration of therapies, advanced monitoring, review of radiographic studies and interpretation of complex data.   Critical Care Time devoted to patient care services, exclusive of separately billable procedures, described in this note is 40 minutes.  Rexene LOISE Blush, PA-C [facility] 06/03/24 9:49 AM  Please see [email or web address] for  pager details.  From 7A-7P if no response, please call [phone] After hours, please call ELink [phone]

## 2024-06-03 NOTE — Progress Notes (Addendum)
 [address] [employer] [address]             [phone]      7 Days Post-Op Procedure(s) (LRB): CREATION, TRACHEOSTOMY (N/A) Subjective: Sedation has been weaned, not following commands.   Objective: Vital signs in last 24 hours: Temp:  [98.8 F (37.1 C)-99.7 F (37.6 C)] 98.8 F (37.1 C) (11/10 0715) Pulse Rate:  [59-87] 83 (11/10 0715) Cardiac Rhythm: Normal sinus rhythm (11/10 0400) Resp:  [0-35] 25 (11/10 0715) BP: (85-160)/(56-73) 102/56 (11/10 0400) SpO2:  [71 %-89 %] 80 % (11/10 0715) Arterial Line BP: (96-174)/(39-82) 129/49 (11/10 0715) FiO2 (%):  [80 %-100 %] 100 % (11/10 0719) Weight:  [116.8 kg] 116.8 kg (11/10 0500)  Hemodynamic parameters for last 24 hours:    Intake/Output from previous day: 11/09 0701 - 11/10 0700 In: 4130.1 [I.V.:1571.1; NG/GT:2419; IV Piggyback:110] Out: 7545 [Urine:7325; Stool:200; Chest Tube:20] Intake/Output this shift: Total I/O In: -  Out: 275 [Urine:275]  General appearance: critically ill patient lying in bed, on ventilator Neurologic: not following commands Heart: regular rate and rhythm, S1, S2 normal, no murmur, click, rub or gallop Lungs: Coarse breath sounds throughout Abdomen: soft, non-tender; bowel sounds normal; no masses,  no organomegaly Extremities: SCDs in place  Wound: Clean and dry dressings in place  Lab Results: Recent Labs    06/02/24 1555 06/02/24 1605 06/03/24 0410 06/03/24 0415  WBC 20.9*  --  18.4*  --   HGB 8.4*   < > 8.5* 8.5*  HCT 26.2*   < > 26.1* 25.0*  PLT 182  --  174  --    < > = values in this interval not displayed.   BMET:  Recent Labs    06/02/24 1555 06/02/24 1605 06/03/24 0410 06/03/24 0415  NA 136   < > 139 139  K 4.2   < > 3.5 3.6  CL 97*  --  95*  --   CO2 30  --  31  --   GLUCOSE 154*  --  185*  --   BUN 45*  --  46*  --   CREATININE 0.73  --  0.68  --   CALCIUM  8.1*  --  8.4*  --    < > = values in this interval not displayed.     PT/INR:  Recent Labs    06/03/24 0410  LABPROT 19.0*  INR 1.5*   ABG    Component Value Date/Time   PHART 7.424 06/03/2024 0415   HCO3 35.2 (H) 06/03/2024 0415   TCO2 37 (H) 06/03/2024 0415   ACIDBASEDEF 3.0 (H) 06/01/2024 1053   O2SAT 79 06/03/2024 0415   CBG (last 3)  Recent Labs    06/02/24 1949 06/02/24 2346 06/03/24 0413  GLUCAP 163* 223* 186*    Assessment/Plan: S/P Procedure(s) (LRB): CREATION, TRACHEOSTOMY (N/A)  Neuro: Sedation weaned only on Dilaudid . Not following commands for me but RN reports he was able to squeeze his hands and move his toes on the right side earlier. Plan to wean Dilaudid  today.    CV: VV ECMO. NSR, HR 70s-80s. On Cleviprex at 8. Plan to start PO BO medications and wean Cleviprex as able as discussed with AHF team. MAP 70 this AM.    Pulm: ARDS, fungal pneumonia. Trach in place, on ventilator. CT 20cc/24hrs output, on water  seal. No air leak. CXR with stable right hydropneumothorax   GI: Tolerating tube feeds. Ulcer prophylaxis  with Protonix    Endo: CBGs overall controlled on SSI.    Renal: Cr 0.68. UO 7325cc/24hrs, net -3414. Continue Lasix drip and Metolazone 5mg  today.    ID: Candida glabrata pneumonia, on Micafungin. Completed Meropenem. Leukocytosis 18.4, trending up. Tmax 99.7. Will closely monitor.    Expected postop ABLA: H/H 8.5/26.1, stable. Not clinically significant at this time.    DVT Prophylaxis: On Bivalirudin  for ECMO   Dispo: Continue ICU care   LOS: 25 days    Con [place] 06/03/2024 Patient seen and examined, rounds with multi-disciplinary team this AM Remains critically ill, but has been able to decrease sedation and off pressor support.  Elspeth MOTE Kerrin, MD [facility] [phone]

## 2024-06-03 NOTE — Progress Notes (Addendum)
 PCCM Interval Progress Note  PM labs reviewed and notable for lactic acid 3.1, ABG 7.44/53/49 and K 2.8 on iStat. Will replace 40 mEQ potassium and follow-up BMP.   In regards to lactic acidosis unclear etiology given PaO2 is actually improving and around where it has been with previously normal lactic acid. Levophed requirement stable at 2. Bedside POCUS with grossly normal biventricular function, IVC small but not collapsible. Will start dexmedetomidine  for sedation and possible HR control in the hopes this will improve oxygenation. Will also hold on Lasix drip for the rest of the night to see if this helps. If lactate continues to rise despite these interventions will need to consider CT CAP in AM. Continue to trend lactic acid next 1900. Plan discussed with Dr. Zenaida and Dr. Gatha who agree.   Rexene LOISE Blush, PA-C [facility] 06/03/24 6:12 PM  Please see [email or web address] for pager details.  From 7A-7P if no response, please call [phone] After hours, please call ELink [phone]

## 2024-06-03 NOTE — Progress Notes (Signed)
 Nutrition Follow-up  DOCUMENTATION CODES:   Severe malnutrition in context of acute illness/injury (superimposed on chronic malnutrition)  INTERVENTION:   Tube Feeding via Cortrak:  Vital 1.5 at 65 ml/hr Pro-Source TF20 60 mL QID Goal TF regimen provides 185 g of protein, 2660 kcals, 1100 mL of free water   Free water  flushes per MD  Banatrol TF 60 mL BID via tube as stool bulking agent- each packet provides 5g soluble fiber    NUTRITION DIAGNOSIS:   Severe Malnutrition related to acute illness (superimposed on chronic malnutrition) as evidenced by severe muscle depletion, severe fat depletion.  Continues  GOAL:   Patient will meet greater than or equal to 90% of their needs  Being addressed via TF   MONITOR:   Vent status, Labs, Weight trends, TF tolerance  REASON FOR ASSESSMENT:   Consult, Ventilator Enteral/tube feeding initiation and management  ASSESSMENT:   63 yo male admitted for surgery with known RUL lung nodule with plan for lobectomy on 10/16. Pt with respiratory decline requiring transfer to ICU 10/21 and intubated on 10/26.  PMH includes COPD, Stroke, MI, GERD, HTN, Hep C, PUD, diverticulitis, HF, hx of polysubstance abuse (Cocaine, marijuana, EtOH), anxiety/depression, Bipolar 1 disorder, schizophrenia  10/16 OR: Robotic assisted R thoracoscopy, RUL wedge resection, R upper lobectomy, lymph node dissection 10/26 Worsening respiratory status requiring intubation, iNO started 10/27 trickle TF started 10/28 TF titrating up 10/29 Cortrak placed-distal stomach; cannulated for VV ECMO, TF at goal 10/30 TF held this morning; re-initiated in afternoon - no s/sx aspiration, trickle TF 10/31 TF remains at trickle til BM, finally had BM after relistor/sorbitol, +nimbex gtt 11/02 TF at goal of 60 11/03 Trach by ENT, weaned off paralytic, propofol  initiated. TEE yesterday with no vegetations. Cortrak at antro-pyloric region of stomach per abd xray. TF held for OR,  restarted at trickle 11/04 ECMO circuit changed at bedside for failing oxygenator, increasing vasopressor. TF titration back to goal rate, increased goal rate of 65 11/06 TF at goal of 65 11/08 Emergency Circuit Change due to air bubbles in arterial limb  Pt remains on vent support via trach, VV ECMO (32 Fr Crescent in internal jugular), Nitric Oxide through circuit Back and forth between Cleviprex (2 kcals/mL lipid) and Levophed Currently on lasix gtt  Vital 1.5 at 65 ml/hr with Pro-Source TF20 60 mL QID via Cortrak  Free water  decreased to 100 mL q 4 hours on 11/08.   UOP 7.3 L in 24 hours Hypernatremia has resolved, BUN 46, Creatinine wdl.   Weights have fluctuated up and down since admission Current wt 116.8 kg Lowest wt 107   +stool, Banatrol BID. FMS in place, 200 mL documented in 24 hours  Pt with chronic malnutrition, now with worsening wasting on exam, meets criteria for severe in acute. Of note, pt with several periods of time when nutrition provision was inadequate with interruptions in TF, TF intolerance, etc. Pt now tolerating TF at goal and will plan to optimize nutrition moving forwards  Noted device related PI to right cheek and right area of clavicle  Labs: Phosphorus 5.3 (H) Potassium 3.5 (wdl) Magnesium  1.8 (wdl)  Meds: Calcium  Carbonate BID SS Novolog Novolog 3 units q 4 hours Lasix gtt KCL   NUTRITION - FOCUSED PHYSICAL EXAM:  Flowsheet Row Most Recent Value  Orbital Region Severe depletion  Upper Arm Region Unable to assess  Thoracic and Lumbar Region Severe depletion  Buccal Region Severe depletion  Temple Region Severe depletion  Clavicle Bone Region Severe  depletion  Clavicle and Acromion Bone Region Severe depletion  Scapular Bone Region Severe depletion  Dorsal Hand Unable to assess  Patellar Region Unable to assess  [previously moderate]  Anterior Thigh Region Unable to assess  [previously moderate]  Posterior Calf Region Unable to  assess  [previously moderate]  Edema (RD Assessment) Moderate    Diet Order:   Diet Order     None       EDUCATION NEEDS:   Not appropriate for education at this time  Skin:  Skin Assessment: Skin Integrity Issues: Skin Integrity Issues:: DTI DTI: L Heel, right cheek and near clavicle (device related from ECMO cannulas), back Incisions: chest (surgical)  Last BM:  11/10 via FMS (200 mL in 24 hours)  Height:   Ht Readings from Last 1 Encounters:  05/19/24 6' 3 (1.905 m)    Weight:   Wt Readings from Last 1 Encounters:  06/03/24 116.8 kg    Ideal Body Weight:  89.1 kg  BMI:  Body mass index is 32.18 kg/m.  Estimated Nutritional Needs:   Kcal:  2400-2600 kcals  Protein:  175-190g  Fluid:  >/= 2L   Betsey Finger MS, RDN, LDN, CNSC Registered Dietitian 3 Clinical Nutrition RD Inpatient Contact Info in Amion

## 2024-06-03 NOTE — Progress Notes (Signed)
 EVENING ROUNDS NOTE :     [address] [employer] [address]             [phone]               7 Days Post-Op Procedure(s) (LRB): CREATION, TRACHEOSTOMY (N/A)   Total Length of Stay:  LOS: 25 days  Events:   No events    BP (!) 114/50   Pulse 84   Temp 99 F (37.2 C)   Resp (!) 22   Ht 6' 3 (1.905 m)   Wt 116.8 kg   SpO2 (!) 84%   BMI 32.18 kg/m      Vent Mode: PCV FiO2 (%):  [80 %-100 %] 100 % Set Rate:  [15 bmp-25 bmp] 25 bmp PEEP:  [10 cmH20] 10 cmH20 Plateau Pressure:  [24 cmH20-29 cmH20] 28 cmH20   albumin  human Stopped (06/03/24 1343)   bivalirudin  (ANGIOMAX ) 250 mg in sodium chloride  0.9 % 500 mL (0.5 mg/mL) infusion 0.11 mg/kg/hr (06/03/24 1600)   clevidipine Stopped (06/03/24 0934)   dexmedetomidine  (PRECEDEX ) IV infusion     feeding supplement (VITAL 1.5 CAL) 65 mL/hr at 06/03/24 1600   furosemide (LASIX) 200 mg in dextrose  5 % 100 mL (2 mg/mL) infusion 8 mg/hr (06/03/24 1600)   HYDROmorphone  3 mg/hr (06/03/24 1600)   micafungin (MYCAMINE) 200 mg in sodium chloride  0.9 % 100 mL IVPB 200 mg (06/03/24 1712)   midazolam  Stopped (06/02/24 1513)   norepinephrine (LEVOPHED) Adult infusion 1 mcg/min (06/03/24 1600)   vasopressin  Stopped (06/02/24 1435)    I/O last 3 completed shifts: In: 6510.2 [I.V.:2871.2; Other:30; WH/HU:6500; IV Piggyback:110] Out: 9220 [Urine:9000; Stool:200; Chest Tube:20]      Latest Ref Rng & Units 06/03/2024    4:49 PM 06/03/2024    4:44 PM 06/03/2024    2:06 PM  CBC  WBC 4.0 - 10.5 K/uL  10.6    Hemoglobin 13.0 - 17.0 g/dL 8.8  8.4  8.5   Hematocrit 39.0 - 52.0 % 26.0  26.1  25.0   Platelets 150 - 400 K/uL  161         Latest Ref Rng & Units 06/03/2024    4:49 PM 06/03/2024    2:06 PM 06/03/2024   12:03 PM  BMP  Sodium 135 - 145 mmol/L 143  142  142   Potassium 3.5 - 5.1 mmol/L 2.8  2.9  3.1     ABG    Component Value Date/Time   PHART 7.444 06/03/2024 1649   PCO2ART 53.3 (H)  06/03/2024 1649   PO2ART 49 (L) 06/03/2024 1649   HCO3 36.5 (H) 06/03/2024 1649   TCO2 38 (H) 06/03/2024 1649   ACIDBASEDEF 3.0 (H) 06/01/2024 1053   O2SAT 85 06/03/2024 1649       Linnie Rayas, MD 06/03/2024 5:52 PM

## 2024-06-04 ENCOUNTER — Inpatient Hospital Stay (HOSPITAL_COMMUNITY): Payer: MEDICAID

## 2024-06-04 ENCOUNTER — Other Ambulatory Visit (HOSPITAL_COMMUNITY): Payer: Self-pay | Admitting: Medical

## 2024-06-04 DIAGNOSIS — F1411 Cocaine abuse, in remission: Secondary | ICD-10-CM

## 2024-06-04 DIAGNOSIS — F1221 Cannabis dependence, in remission: Secondary | ICD-10-CM

## 2024-06-04 DIAGNOSIS — I351 Nonrheumatic aortic (valve) insufficiency: Secondary | ICD-10-CM | POA: Diagnosis not present

## 2024-06-04 DIAGNOSIS — F1091 Alcohol use, unspecified, in remission: Secondary | ICD-10-CM

## 2024-06-04 DIAGNOSIS — J9622 Acute and chronic respiratory failure with hypercapnia: Secondary | ICD-10-CM | POA: Diagnosis not present

## 2024-06-04 DIAGNOSIS — F419 Anxiety disorder, unspecified: Secondary | ICD-10-CM

## 2024-06-04 DIAGNOSIS — J90 Pleural effusion, not elsewhere classified: Secondary | ICD-10-CM | POA: Diagnosis not present

## 2024-06-04 DIAGNOSIS — F17210 Nicotine dependence, cigarettes, uncomplicated: Secondary | ICD-10-CM

## 2024-06-04 DIAGNOSIS — D649 Anemia, unspecified: Secondary | ICD-10-CM | POA: Diagnosis not present

## 2024-06-04 DIAGNOSIS — J939 Pneumothorax, unspecified: Secondary | ICD-10-CM | POA: Diagnosis not present

## 2024-06-04 DIAGNOSIS — F411 Generalized anxiety disorder: Secondary | ICD-10-CM

## 2024-06-04 DIAGNOSIS — F1011 Alcohol abuse, in remission: Secondary | ICD-10-CM

## 2024-06-04 DIAGNOSIS — C3411 Malignant neoplasm of upper lobe, right bronchus or lung: Secondary | ICD-10-CM

## 2024-06-04 DIAGNOSIS — J80 Acute respiratory distress syndrome: Secondary | ICD-10-CM | POA: Diagnosis not present

## 2024-06-04 DIAGNOSIS — F1290 Cannabis use, unspecified, uncomplicated: Secondary | ICD-10-CM

## 2024-06-04 DIAGNOSIS — Z9281 Personal history of extracorporeal membrane oxygenation (ECMO): Secondary | ICD-10-CM

## 2024-06-04 DIAGNOSIS — J449 Chronic obstructive pulmonary disease, unspecified: Secondary | ICD-10-CM | POA: Diagnosis not present

## 2024-06-04 DIAGNOSIS — L8990 Pressure ulcer of unspecified site, unspecified stage: Secondary | ICD-10-CM | POA: Insufficient documentation

## 2024-06-04 DIAGNOSIS — F250 Schizoaffective disorder, bipolar type: Secondary | ICD-10-CM

## 2024-06-04 DIAGNOSIS — F1421 Cocaine dependence, in remission: Secondary | ICD-10-CM

## 2024-06-04 DIAGNOSIS — Z902 Acquired absence of lung [part of]: Secondary | ICD-10-CM | POA: Diagnosis not present

## 2024-06-04 DIAGNOSIS — F4310 Post-traumatic stress disorder, unspecified: Secondary | ICD-10-CM

## 2024-06-04 DIAGNOSIS — F1111 Opioid abuse, in remission: Secondary | ICD-10-CM

## 2024-06-04 DIAGNOSIS — J9621 Acute and chronic respiratory failure with hypoxia: Secondary | ICD-10-CM | POA: Diagnosis not present

## 2024-06-04 DIAGNOSIS — B377 Candidal sepsis: Secondary | ICD-10-CM | POA: Diagnosis not present

## 2024-06-04 LAB — POCT I-STAT 7, (LYTES, BLD GAS, ICA,H+H)
Acid-Base Excess: 10 mmol/L — ABNORMAL HIGH (ref 0.0–2.0)
Acid-Base Excess: 10 mmol/L — ABNORMAL HIGH (ref 0.0–2.0)
Acid-Base Excess: 6 mmol/L — ABNORMAL HIGH (ref 0.0–2.0)
Acid-Base Excess: 4 mmol/L — ABNORMAL HIGH (ref 0.0–2.0)
Acid-Base Excess: 4 mmol/L — ABNORMAL HIGH (ref 0.0–2.0)
Bicarbonate: 36.1 mmol/L — ABNORMAL HIGH (ref 20.0–28.0)
Bicarbonate: 35.7 mmol/L — ABNORMAL HIGH (ref 20.0–28.0)
Bicarbonate: 31.7 mmol/L — ABNORMAL HIGH (ref 20.0–28.0)
Bicarbonate: 28.1 mmol/L — ABNORMAL HIGH (ref 20.0–28.0)
Bicarbonate: 30.6 mmol/L — ABNORMAL HIGH (ref 20.0–28.0)
Calcium, Ion: 1.11 mmol/L — ABNORMAL LOW (ref 1.15–1.40)
Calcium, Ion: 1.11 mmol/L — ABNORMAL LOW (ref 1.15–1.40)
Calcium, Ion: 1.13 mmol/L — ABNORMAL LOW (ref 1.15–1.40)
Calcium, Ion: 1.09 mmol/L — ABNORMAL LOW (ref 1.15–1.40)
Calcium, Ion: 1.11 mmol/L — ABNORMAL LOW (ref 1.15–1.40)
HCT: 25 % — ABNORMAL LOW (ref 39.0–52.0)
HCT: 26 % — ABNORMAL LOW (ref 39.0–52.0)
HCT: 25 % — ABNORMAL LOW (ref 39.0–52.0)
HCT: 23 % — ABNORMAL LOW (ref 39.0–52.0)
HCT: 23 % — ABNORMAL LOW (ref 39.0–52.0)
Hemoglobin: 8.5 g/dL — ABNORMAL LOW (ref 13.0–17.0)
Hemoglobin: 8.8 g/dL — ABNORMAL LOW (ref 13.0–17.0)
Hemoglobin: 8.5 g/dL — ABNORMAL LOW (ref 13.0–17.0)
Hemoglobin: 7.8 g/dL — ABNORMAL LOW (ref 13.0–17.0)
Hemoglobin: 7.8 g/dL — ABNORMAL LOW (ref 13.0–17.0)
O2 Saturation: 76 %
O2 Saturation: 79 %
O2 Saturation: 71 %
O2 Saturation: 100 %
O2 Saturation: 33 %
Patient temperature: 37.6
Patient temperature: 37.3
Patient temperature: 36.6
Patient temperature: 36.6
Patient temperature: 36.6
Potassium: 4 mmol/L (ref 3.5–5.1)
Potassium: 4 mmol/L (ref 3.5–5.1)
Potassium: 3.6 mmol/L (ref 3.5–5.1)
Potassium: 3.5 mmol/L (ref 3.5–5.1)
Potassium: 3.6 mmol/L (ref 3.5–5.1)
Sodium: 143 mmol/L (ref 135–145)
Sodium: 143 mmol/L (ref 135–145)
Sodium: 145 mmol/L (ref 135–145)
Sodium: 144 mmol/L (ref 135–145)
Sodium: 144 mmol/L (ref 135–145)
TCO2: 38 mmol/L — ABNORMAL HIGH (ref 22–32)
TCO2: 37 mmol/L — ABNORMAL HIGH (ref 22–32)
TCO2: 33 mmol/L — ABNORMAL HIGH (ref 22–32)
TCO2: 29 mmol/L (ref 22–32)
TCO2: 32 mmol/L (ref 22–32)
pCO2 arterial: 55.6 mmHg — ABNORMAL HIGH (ref 32–48)
pCO2 arterial: 54.4 mmHg — ABNORMAL HIGH (ref 32–48)
pCO2 arterial: 54.1 mmHg — ABNORMAL HIGH (ref 32–48)
pCO2 arterial: 39.7 mmHg (ref 32–48)
pCO2 arterial: 59.1 mmHg — ABNORMAL HIGH (ref 32–48)
pH, Arterial: 7.423 (ref 7.35–7.45)
pH, Arterial: 7.426 (ref 7.35–7.45)
pH, Arterial: 7.374 (ref 7.35–7.45)
pH, Arterial: 7.32 — ABNORMAL LOW (ref 7.35–7.45)
pH, Arterial: 7.456 — ABNORMAL HIGH (ref 7.35–7.45)
pO2, Arterial: 42 mmHg — ABNORMAL LOW (ref 83–108)
pO2, Arterial: 44 mmHg — ABNORMAL LOW (ref 83–108)
pO2, Arterial: 38 mmHg — CL (ref 83–108)
pO2, Arterial: 22 mmHg — CL (ref 83–108)
pO2, Arterial: 261 mmHg — ABNORMAL HIGH (ref 83–108)

## 2024-06-04 LAB — CBC WITH DIFFERENTIAL/PLATELET
Abs Immature Granulocytes: 0.05 K/uL (ref 0.00–0.07)
Basophils Absolute: 0.1 K/uL (ref 0.0–0.1)
Basophils Relative: 1 %
Eosinophils Absolute: 0.1 K/uL (ref 0.0–0.5)
Eosinophils Relative: 1 %
HCT: 26.3 % — ABNORMAL LOW (ref 39.0–52.0)
Hemoglobin: 8.3 g/dL — ABNORMAL LOW (ref 13.0–17.0)
Immature Granulocytes: 1 %
Lymphocytes Relative: 11 %
Lymphs Abs: 1.2 K/uL (ref 0.7–4.0)
MCH: 31.7 pg (ref 26.0–34.0)
MCHC: 31.6 g/dL (ref 30.0–36.0)
MCV: 100.4 fL — ABNORMAL HIGH (ref 80.0–100.0)
Monocytes Absolute: 0.7 K/uL (ref 0.1–1.0)
Monocytes Relative: 6 %
Neutro Abs: 8.9 K/uL — ABNORMAL HIGH (ref 1.7–7.7)
Neutrophils Relative %: 80 %
Platelets: 159 K/uL (ref 150–400)
RBC: 2.62 MIL/uL — ABNORMAL LOW (ref 4.22–5.81)
RDW: 20.9 % — ABNORMAL HIGH (ref 11.5–15.5)
Smear Review: NORMAL
WBC: 11 K/uL — ABNORMAL HIGH (ref 4.0–10.5)
nRBC: 1.5 % — ABNORMAL HIGH (ref 0.0–0.2)

## 2024-06-04 LAB — CG4 I-STAT (LACTIC ACID)
Lactic Acid, Venous: 2.6 mmol/L (ref 0.5–1.9)
Lactic Acid, Venous: 3.3 mmol/L (ref 0.5–1.9)
Lactic Acid, Venous: 5.2 mmol/L (ref 0.5–1.9)

## 2024-06-04 LAB — BASIC METABOLIC PANEL WITH GFR
Anion gap: 15 (ref 5–15)
BUN: 61 mg/dL — ABNORMAL HIGH (ref 8–23)
CO2: 33 mmol/L — ABNORMAL HIGH (ref 22–32)
Calcium: 8.2 mg/dL — ABNORMAL LOW (ref 8.9–10.3)
Chloride: 97 mmol/L — ABNORMAL LOW (ref 98–111)
Creatinine, Ser: 0.94 mg/dL (ref 0.61–1.24)
GFR, Estimated: >60 mL/min (ref >60)
Glucose, Bld: 143 mg/dL — ABNORMAL HIGH (ref 70–99)
Potassium: 4.1 mmol/L (ref 3.5–5.1)
Sodium: 145 mmol/L (ref 135–145)

## 2024-06-04 LAB — HEPATIC FUNCTION PANEL
ALT: 23 U/L (ref 0–44)
AST: 40 U/L (ref 15–41)
Albumin: 1.8 g/dL — ABNORMAL LOW (ref 3.5–5.0)
Alkaline Phosphatase: 96 U/L (ref 38–126)
Bilirubin, Direct: 1.6 mg/dL — ABNORMAL HIGH (ref 0.0–0.2)
Indirect Bilirubin: 1.3 mg/dL — ABNORMAL HIGH (ref 0.3–0.9)
Total Bilirubin: 2.9 mg/dL — ABNORMAL HIGH (ref 0.0–1.2)
Total Protein: 5.7 g/dL — ABNORMAL LOW (ref 6.5–8.1)

## 2024-06-04 LAB — TRIGLYCERIDES: Triglycerides: 65 mg/dL (ref <150)

## 2024-06-04 LAB — LACTATE DEHYDROGENASE: LDH: 494 U/L — ABNORMAL HIGH (ref 105–235)

## 2024-06-04 LAB — PROTIME-INR
INR: 2 — ABNORMAL HIGH (ref 0.8–1.2)
Prothrombin Time: 23.2 s — ABNORMAL HIGH (ref 11.4–15.2)

## 2024-06-04 LAB — FIBRINOGEN: Fibrinogen: >800 mg/dL — ABNORMAL HIGH (ref 210–475)

## 2024-06-04 LAB — ECHO TEE

## 2024-06-04 LAB — GLUCOSE, CAPILLARY
Glucose-Capillary: 109 mg/dL — ABNORMAL HIGH (ref 70–99)
Glucose-Capillary: 117 mg/dL — ABNORMAL HIGH (ref 70–99)
Glucose-Capillary: 136 mg/dL — ABNORMAL HIGH (ref 70–99)
Glucose-Capillary: 136 mg/dL — ABNORMAL HIGH (ref 70–99)
Glucose-Capillary: 162 mg/dL — ABNORMAL HIGH (ref 70–99)
Glucose-Capillary: 175 mg/dL — ABNORMAL HIGH (ref 70–99)

## 2024-06-04 LAB — MAGNESIUM: Magnesium: 2.5 mg/dL — ABNORMAL HIGH (ref 1.7–2.4)

## 2024-06-04 LAB — PHOSPHORUS: Phosphorus: 4.9 mg/dL — ABNORMAL HIGH (ref 2.5–4.6)

## 2024-06-04 LAB — APTT: aPTT: 73 s — ABNORMAL HIGH (ref 24–36)

## 2024-06-04 MED ORDER — EPINEPHRINE 1 MG/10ML IV SOSY
PREFILLED_SYRINGE | INTRAVENOUS | Status: AC
  Filled 2024-06-04: qty 10

## 2024-06-04 MED ORDER — PIPERACILLIN-TAZOBACTAM 3.375 G IVPB
3.3750 g | Freq: Three times a day (TID) | INTRAVENOUS | Status: DC
  Administered 2024-06-04 (×2): 3.375 g via INTRAVENOUS
  Filled 2024-06-04 (×2): qty 50

## 2024-06-04 MED ORDER — GLYCOPYRROLATE 1 MG PO TABS: 1.0000 mg | ORAL_TABLET | ORAL | Status: DC | PRN

## 2024-06-04 MED ORDER — MIDAZOLAM HCL (PF) 2 MG/2ML IJ SOLN: 4.0000 mg | Freq: Once | INTRAMUSCULAR | Status: AC

## 2024-06-04 MED ORDER — SODIUM CHLORIDE 0.9% FLUSH
10.0000 mL | Freq: Three times a day (TID) | INTRAVENOUS | Status: DC
  Administered 2024-06-04: 10 mL via INTRAPLEURAL

## 2024-06-04 MED ORDER — PHENYLEPHRINE 80 MCG/ML (10ML) SYRINGE FOR IV PUSH (FOR BLOOD PRESSURE SUPPORT)
PREFILLED_SYRINGE | INTRAVENOUS | Status: AC
  Administered 2024-06-04: 200 ug via INTRAVENOUS
  Filled 2024-06-04: qty 10

## 2024-06-04 MED ORDER — ROCURONIUM BROMIDE 10 MG/ML (PF) SYRINGE
PREFILLED_SYRINGE | INTRAVENOUS | Status: AC
  Administered 2024-06-04: 60 mg via INTRAVENOUS
  Filled 2024-06-04: qty 10

## 2024-06-04 MED ORDER — MIDAZOLAM HCL 2 MG/2ML IJ SOLN
INTRAMUSCULAR | Status: AC
  Administered 2024-06-04: 4 mg via INTRAVENOUS
  Filled 2024-06-04: qty 4

## 2024-06-04 MED ORDER — MIDAZOLAM BOLUS VIA INFUSION (WITHDRAWAL LIFE SUSTAINING TX)
2.0000 mg | INTRAVENOUS | Status: DC | PRN
  Administered 2024-06-04 (×2): 2 mg via INTRAVENOUS

## 2024-06-04 MED ORDER — ROCURONIUM BROMIDE 10 MG/ML (PF) SYRINGE: 60.0000 mg | PREFILLED_SYRINGE | Freq: Once | INTRAVENOUS | Status: AC

## 2024-06-04 MED ORDER — MIDAZOLAM HCL (PF) 2 MG/2ML IJ SOLN: 1.0000 mg | INTRAMUSCULAR | Status: DC | PRN

## 2024-06-04 MED ORDER — ACETAMINOPHEN 650 MG RE SUPP: 650.0000 mg | Freq: Four times a day (QID) | RECTAL | Status: DC | PRN

## 2024-06-04 MED ORDER — PHENYLEPHRINE 80 MCG/ML (10ML) SYRINGE FOR IV PUSH (FOR BLOOD PRESSURE SUPPORT): 200.0000 ug | PREFILLED_SYRINGE | Freq: Once | INTRAVENOUS | Status: AC | PRN

## 2024-06-04 MED ORDER — CALCIUM GLUCONATE-NACL 1-0.675 GM/50ML-% IV SOLN
1.0000 g | Freq: Once | INTRAVENOUS | Status: AC
  Administered 2024-06-04: 1000 mg via INTRAVENOUS
  Filled 2024-06-04: qty 50

## 2024-06-04 MED ORDER — ETOMIDATE 2 MG/ML IV SOLN
20.0000 mg | Freq: Once | INTRAVENOUS | Status: AC
  Administered 2024-06-04: 20 mg via INTRAVENOUS
  Filled 2024-06-04: qty 10

## 2024-06-04 MED ORDER — SUCCINYLCHOLINE CHLORIDE 200 MG/10ML IV SOSY
100.0000 mg | PREFILLED_SYRINGE | Freq: Once | INTRAVENOUS | Status: AC
  Administered 2024-06-04: 100 mg via INTRAVENOUS
  Filled 2024-06-04: qty 10

## 2024-06-04 MED ORDER — GLYCOPYRROLATE 0.2 MG/ML IJ SOLN: 0.2000 mg | INTRAMUSCULAR | Status: DC | PRN

## 2024-06-04 MED ORDER — PHENYLEPHRINE 80 MCG/ML (10ML) SYRINGE FOR IV PUSH (FOR BLOOD PRESSURE SUPPORT)
PREFILLED_SYRINGE | INTRAVENOUS | Status: AC
  Filled 2024-06-04: qty 10

## 2024-06-04 MED ORDER — LINEZOLID 600 MG/300ML IV SOLN
600.0000 mg | Freq: Two times a day (BID) | INTRAVENOUS | Status: DC
  Administered 2024-06-04: 600 mg via INTRAVENOUS
  Filled 2024-06-04 (×2): qty 300

## 2024-06-04 MED ORDER — STERILE WATER FOR INJECTION IJ SOLN
INTRAMUSCULAR | Status: AC
  Filled 2024-06-04: qty 10

## 2024-06-04 MED ORDER — MIDAZOLAM-SODIUM CHLORIDE 100-0.9 MG/100ML-% IV SOLN
0.0000 mg/h | INTRAVENOUS | Status: DC
  Administered 2024-06-04: 1 mg/h via INTRAVENOUS
  Filled 2024-06-04: qty 100

## 2024-06-04 MED ORDER — ACETAMINOPHEN 325 MG PO TABS: 650.0000 mg | ORAL_TABLET | Freq: Four times a day (QID) | ORAL | Status: DC | PRN

## 2024-06-04 MED ORDER — GLYCOPYRROLATE 0.2 MG/ML IJ SOLN
0.2000 mg | INTRAMUSCULAR | Status: DC | PRN
Start: 2024-06-04 — End: 2024-06-05
  Administered 2024-06-04: 0.2 mg via INTRAVENOUS
  Filled 2024-06-04: qty 1

## 2024-06-04 MED ORDER — VASOPRESSIN 20 UNITS/100 ML INFUSION FOR SHOCK
INTRAVENOUS | Status: AC
Start: 2024-06-04 — End: 2024-06-04
  Administered 2024-06-04: 0.02 [IU]/min via INTRAVENOUS
  Filled 2024-06-04: qty 100

## 2024-06-05 ENCOUNTER — Ambulatory Visit: Payer: Self-pay | Admitting: Family

## 2024-06-05 LAB — CULTURE, RESPIRATORY W GRAM STAIN

## 2024-06-05 NOTE — Progress Notes (Signed)
 Sent to Primary Care.

## 2024-06-05 NOTE — Telephone Encounter (Signed)
 [facility]                                                                                Discharge Summary   Date of Admission: 02/26/2024 Referall Source:   Dr Izella                                                                       Date of Discharge: 06/05/2024 Sobriety Date: 02/01/2024 (Cocaine THC) Admission Diagnosis:  Cocaine use disorder, severe, dependence (HCC)  F14.20        2. Cannabis use disorder  F12.90       3. Alcohol  use disorder, mild, in early remission  F10.11       4. Opioid use disorder, mild, in early remission, abuse (HCC)  F11.11       5. Tobacco dependence due to cigarettes  F17.210       6. Schizoaffective disorder, bipolar type (HCC)  F25.0       7. PTSD (post-traumatic stress disorder)  F43.10       8. Generalized anxiety disorder  F41.1       9. Unresolved grief  F43.21       10. Chronic pain syndrome  G89.4       11. Conflict between patient and family  Z34.9      Mother     34. Lung nodule seen on imaging study  R91.1        Course of Treatment: Pt did well in treatment being able to abstain and improve his realationship with his mother. He was put on leave for his lung cancer surgery. Unfortunately he was not able to survive and died 06/17/24 clean and sober  Medications:  acetaminophen  325 MG tablet Commonly known as: TYLENOL  Take 1 tablet (325 mg total) by mouth every 6 (six) hours as needed for mild pain (pain score 1-3) (or temp > 100.5).  albuterol  108 (90 Base) MCG/ACT inhaler Commonly known as: VENTOLIN  HFA Inhale 2 puffs into the lungs every 6 (six) hours as needed for wheezing or shortness of breath.  aspirin  EC 81 MG tablet Take 1 tablet (81 mg total) by mouth daily. Swallow whole.  atorvastatin  80 MG tablet Commonly known as: LIPITOR  TAKE 1 TABLET BY MOUTH EVERY DAY  Breztri  Aerosphere 160-9-4.8 MCG/ACT Aero inhaler Generic drug:  budesonide -glycopyrrolate -formoterol  Inhale 2 puffs into the lungs in the morning and at bedtime.  celecoxib  100 MG capsule Commonly known as: CELEBREX  TAKE 2 CAPSULES BY MOUTH 2 TIMES DAILY.  divalproex  500 MG DR tablet Commonly known as: DEPAKOTE  Take half tablet in the morning and 3 tablets at bedtime  DULoxetine  30 MG capsule Commonly known as: CYMBALTA  Take 3 capsules (90 mg total) by mouth daily. According to our records, you may have been taking this medication differently.  gabapentin  600 MG tablet Commonly known as: NEURONTIN  Take 1 tablet (600 mg total) by mouth daily AND  2 tablets (1,200 mg total) at bedtime.  hydrOXYzine  25 MG tablet Commonly known as: ATARAX  Take 1 tablet (25 mg total) by mouth 2 (two) times daily as needed.  isosorbide  mononitrate 30 MG 24 hr tablet Commonly known as: IMDUR  Take 1 tablet (30 mg total) by mouth 2 (two) times daily.  Mens 50+ Multivitamin Tabs Take 1 tablet by mouth in the morning.  * nicotine  21 mg/24hr patch Commonly known as: NICODERM CQ  - dosed in mg/24 hours Place 1 patch (21 mg total) onto the skin daily.  * nicotine  14 mg/24hr patch Commonly known as: NICODERM CQ  - dosed in mg/24 hours Place 1 patch (14 mg total) onto the skin daily for 14 days.  * nicotine  7 mg/24hr patch Commonly known as: NICODERM CQ  - dosed in mg/24 hr Place 1 patch (7 mg total) onto the skin daily for 14 days.  nicotine  polacrilex 2 MG lozenge Commonly known as: Nicotine  Mini Take 1 lozenge (2 mg total) by mouth every 2 (two) hours as needed for smoking cessation.  nitroGLYCERIN  0.4 MG SL tablet Commonly known as: Nitrostat  Place 1 tablet (0.4 mg total) under the tongue every 5 (five) minutes as needed for chest pain.  pantoprazole  40 MG tablet Commonly known as: PROTONIX  Take 1 tablet (40 mg total) by mouth daily. According to our records, you may have been taking this medication differently.  predniSONE  10 MG tablet Commonly known as: DELTASONE  Takes 4 tablets  by mouth for 1 day, then 3 tablets for 1 day, then 2 tablets for 1 day, then 1 tablet for 1 day, and then stop.  QUEtiapine  200 MG tablet Commonly known as: SEROquel  Take 1.5 tablets (300 mg total) by mouth at bedtime.  traZODone  100 MG tablet Commonly known as: DESYREL  Take 1 tablet (100 mg total) by mouth at bedtime.    Discharge Diagnosis:                                                                                Cocaine use disorder, severe, dependence (HCC) in early remission Cannabis use disorder,severe,dependence, in early remission PTSD (post-traumatic stress disorder) Schizoaffective disorder, bipolar type (HCC) Tobacco dependence due to cigarettes Generalized anxiety disorder Alcohol  use disorder in remission Alcohol  use disorder, mild, in early remission Unresolved grief Chronic pain syndrome Malignant neoplasm of upper lobe of right lung   Plan of Action to Address Continuing Problems:  08-Jun-2024    Patient's Family along with Morene Brownie and this Nurse at bedside when comfort care/ withdraw protocol started. Patient asystole at 1835. This Nurse Auscultated heart and lungs sounds for 2 minutes, no sounds noted. Second RN verified: Rankin Christians RN . Pt pronounced at 1835 on 08-Jun-2024.          Client has NOT participated in the development of this discharge plan and can not receive a copy of this completed plan due to his passing  Patient ID: Keith Gibbs, male   DOB: 07-14-1961, 63 y.o.   MRN: [id number]

## 2024-06-06 LAB — CULTURE, RESPIRATORY W GRAM STAIN

## 2024-06-07 LAB — TYPE AND SCREEN
ABO/RH(D): O POS
Antibody Screen: NEGATIVE
Unit division: 0
Unit division: 0
Unit division: 0
Unit division: 0

## 2024-06-07 LAB — BPAM RBC
Blood Product Expiration Date: 202512032359
Blood Product Expiration Date: 202512032359
Blood Product Expiration Date: 202512032359
Blood Product Expiration Date: 202512062359
ISSUE DATE / TIME: 202511071248
ISSUE DATE / TIME: 202511071248
ISSUE DATE / TIME: 202511071248
Unit Type and Rh: 5100
Unit Type and Rh: 5100
Unit Type and Rh: 5100
Unit Type and Rh: 5100

## 2024-06-07 NOTE — Progress Notes (Signed)
 Sent to Primary Care.

## 2024-06-11 ENCOUNTER — Telehealth (HOSPITAL_COMMUNITY): Payer: Self-pay

## 2024-06-11 NOTE — Telephone Encounter (Signed)
 Dasean's mother calls and leaves a message. She asks that the therapists tell the group on tomorrow that there will be a graveside service for [place] on Thursday at noon at [facility] on [address].  She states that she has already been in contact with some of the group members.  Darice Simpler, MS, LMFT, LCAS

## 2024-06-18 ENCOUNTER — Ambulatory Visit: Payer: MEDICAID | Admitting: Cardiovascular Disease

## 2024-06-21 LAB — FUNGUS CULTURE RESULT

## 2024-06-21 LAB — FUNGUS CULTURE WITH STAIN

## 2024-06-21 LAB — FUNGAL ORGANISM REFLEX

## 2024-06-24 NOTE — Procedures (Signed)
 Insertion of Chest Tube Procedure Note  Keith Gibbs  [id number]  1960-09-04  Date:06/10/2024  Time:1:44 PM    Provider Performing: Tamela Stakes   Procedure: Chest Tube Insertion [phone])  Indication(s) Effusion  Consent Risks of the procedure as well as the alternatives and risks of each were explained to the patient and/or caregiver.  Consent for the procedure was obtained and is signed in the bedside chart  Anesthesia Topical only with 1% lidocaine     Time Out Verified patient identification, verified procedure, site/side was marked, verified correct patient position, special equipment/implants available, medications/allergies/relevant history reviewed, required imaging and test results available.   Sterile Technique Maximal sterile technique including full sterile barrier drape, hand hygiene, sterile gown, sterile gloves, mask, hair covering, sterile ultrasound probe cover (if used).   Procedure Description Ultrasound used to identify appropriate pleural anatomy for placement and overlying skin marked. Area of placement cleaned and draped in sterile fashion.  A 14 French pigtail pleural catheter was placed into the left pleural space using Seldinger technique. Appropriate return of fluid was obtained.  The tube was connected to atrium and placed on -20 cm H2O wall suction.   Complications/Tolerance None; patient tolerated the procedure well. Chest X-ray is ordered to verify placement.   EBL Minimal  Specimen(s) fluid sent for cultures, chemistry and cell count.  Tamela Stakes, MD  Attending Physician, Critical Care Medicine [facility] See Amion for pager If no response to pager, please call [phone] until 7pm After 7pm, Please call E-link [phone]

## 2024-06-24 NOTE — Procedures (Signed)
 Bronchoscopy Procedure Note  FIDENCIO DUDDY  [id number]  05-Feb-1961  Date:June 30, 2024  Time:8:56 AM   Provider Performing:Easton Fetty   Procedure(s):  Flexible Bronchoscopy [phone])  Indication(s) ARDS  Consent Risks of the procedure as well as the alternatives and risks of each were explained to the patient and/or caregiver.  Consent for the procedure was obtained and is signed in the bedside chart  Anesthesia 20 mg etomidate and 100 mg succinylcholine    Time Out Verified patient identification, verified procedure, site/side was marked, verified correct patient position, special equipment/implants available, medications/allergies/relevant history reviewed, required imaging and test results available.   Sterile Technique Usual hand hygiene, masks, gowns, and gloves were used   Procedure Description Bronchoscope advanced through tracheostomy tube and into airway.  Airways were examined down to subsegmental level with findings noted below.   Following diagnostic evaluation, BAL(s) performed in left lower lobe with normal saline and return of 30 mL fluid  Findings: Patient had a thick mucous secretions in the right mainstem bronchus intermedius and lower lobe.  Patient also had thick secretions in the left bronchial tree.  We did a BAL in the left lower lobe and the return sent for cell count and culture.  Complications/Tolerance None; patient tolerated the procedure well. Chest X-ray is needed post procedure.   EBL Minimal   Specimen(s) BAL from left lower lobe.  Tamela Stakes, MD  Attending Physician, Critical Care Medicine [facility] See Amion for pager If no response to pager, please call [phone] until 7pm After 7pm, Please call E-link [phone]

## 2024-06-24 NOTE — Progress Notes (Signed)
 PHARMACY - ANTICOAGULATION CONSULT NOTE  Pharmacy Consult for heparin  >> bivalirudin  Indication: VTE, VV ECMO (10/29 - current)  Allergies  Allergen Reactions   Asenapine Other (See Comments) and Nausea And Vomiting    Increased tremors   Dextromethorphan Hbr    Guaifenesin     Latuda [Lurasidone Hcl] Other (See Comments)    Tremors     Lurasidone Other (See Comments) and Hives    Other Reaction(s): Angioedema   Phenylephrine      Patient Measurements: Height: 6' 3 (190.5 cm) Weight: 115.2 kg (253 lb 15.5 oz) IBW/kg (Calculated) : 84.5  Vital Signs: Temp: 99.1 F (37.3 C) 06/21/2024 0706) Temp Source: Bladder (11/11 0400) BP: 125/50 06-21-24 0706) Pulse Rate: 84 06-21-2024 0706)  Labs: Recent Labs    06/02/24 0420 06/02/24 0742 06/03/24 0410 06/03/24 0415 06/03/24 1644 06/03/24 1649 06/03/24 2011 June 21, 2024 0509 2024/06/21 0518  HGB 7.8*   < > 8.5*   < > 8.4*   < > 9.2* 8.5* 8.3*  HCT 24.1*   < > 26.1*   < > 26.1*   < > 27.0* 25.0* 26.3*  PLT 146*   < > 174  --  161  --   --   --  159  APTT 69*   < > 70*  --  77*  --   --   --  73*  LABPROT 17.7*  --  19.0*  --   --   --   --   --  23.2*  INR 1.4*  --  1.5*  --   --   --   --   --  2.0*  CREATININE 0.71   < > 0.68  --  0.74  --   --   --  0.94   < > = values in this interval not displayed.    Estimated Creatinine Clearance: 110.1 mL/min (by C-G formula based on SCr of 0.94 mg/dL).   Assessment: 20 yoM admitted for RUL lobectomy.  Pt intubated, now having difficulty with ventilation. Placed on VV ECMO 10/29 in the evening. Pharmacy to dose IV bivalirudin .   Previously on therapeutic heparin  (HL 0.33 on 1700/h) for acute DVT of gastrocnemius vein  > changed to bivalirudin  with ECMO cannulation.  S/p circuit change 11/4 S/p circuit exchange 11/8  aPTT remains therapeutic at 73, on bivalirudin  0.11mg /kg/hr. CBC stable - Hgb 8.8, plt 159. LDH 494. Fibrinogen >800. No s/sx of bleeding or infusion issues - circuits with  clots in 3/9 positions (stable).   Goal of Therapy:  aPTT goal increased to 70-80 sec on 11/7 Monitor platelets by anticoagulation protocol: Yes   Plan:  Continue bivalirudin  0.11 mg/kg/hr (using 106 kg dosing weight) Q12h aPTT and CBC  Thank you for allowing pharmacy to participate in this patient's care,  Suzen Sour, PharmD, BCCCP Clinical Pharmacist  Phone: [phone] 06/21/2024 8:09 AM  Please check AMION for all [facility] phone numbers After 10:00 PM, call Main Pharmacy [phone]

## 2024-06-24 NOTE — TOC Progression Note (Signed)
 Transition of Care [facility]) - Progression Note    Patient Details  Name: Keith Gibbs MRN: [id number] Date of Birth: 1960-11-05  Transition of Care [facility]) CM/SW Contact  Justina Delcia Czar, RN Phone Number: 06-27-24, 2:07 PM  Clinical Narrative:     Patient remain intubated, ECMO and Levophed, with chest tube insertion.    Patient lives at home with mother.    Chart reviewed for discharge readiness, patient not medically stable for d/c. Inpatient CM/CSW will continue to monitor pt's advancement through interdisciplinary progression rounds.    If new pt transition needs arise, MD please place a TOC consult.      Expected Discharge Plan: Home w Home Health Services Barriers to Discharge: Continued Medical Work up    Expected Discharge Plan and Services   Discharge Planning Services: CM Consult   Living arrangements for the past 2 months: Single Family Home   Social Drivers of Health (SDOH) Interventions SDOH Screenings   Food Insecurity: No Food Insecurity (05/09/2024)  Recent Concern: Food Insecurity - Food Insecurity Present (04/16/2024)  Housing: Unknown (05/10/2024)  Recent Concern: Housing - High Risk (04/16/2024)  Transportation Needs: No Transportation Needs (05/09/2024)  Utilities: Not At Risk (05/09/2024)  Alcohol  Screen: Low Risk  (01/29/2023)  Depression (PHQ2-9): Medium Risk (04/05/2024)  Financial Resource Strain: High Risk (04/16/2024)  Physical Activity: Inactive (04/16/2024)  Social Connections: Moderately Integrated (05/10/2024)  Stress: Stress Concern Present (04/16/2024)  Tobacco Use: Medium Risk (05/09/2024)  Health Literacy: Adequate Health Literacy (12/19/2023)    Readmission Risk Interventions    03/01/2022   11:27 AM  Readmission Risk Prevention Plan  Transportation Screening Complete  HRI or Home Care Consult Complete  Social Work Consult for Recovery Care Planning/Counseling Complete  Palliative Care Screening Not Applicable  Medication  Review Oceanographer) Complete

## 2024-06-24 NOTE — CV Procedure (Signed)
 ECMO NOTE:   Indication: Respiratory failure due to ARDS   Initial cannulation date: 05/22/24   ECMO type: VV ECMO   Dual lumen inflow/return cannula:   1) 84F [place] placed in RIJ    Daily data:   Flow 5.03 RPM 3800 Sweep  11L Pven -99   Labs:   ABG    Component Value Date/Time   PHART 7.426 06/08/24 0739   PCO2ART 54.4 (H) 06/08/2024 0739   PO2ART 44 (L) 2024/06/08 0739   HCO3 35.7 (H) 06-08-24 0739   TCO2 37 (H) 2024-06-08 0739   ACIDBASEDEF 3.0 (H) 06/01/2024 1053   O2SAT 79 06-08-24 0739    Hgb 8.3 Platelets 159 LDH 494 PTT 73 goal of 70-80.   Plan:   Continue VV ECMO support Await lung recovery, expect prolonged course No significant fibrosis on chest CT per CCM Circuit change 11/4 for reduced oxygenator efficiency Circuit change 11/8 for air entrainment Diuresis held 11/10 for rising lactic acid Continue antifungals, completed course of meropenem for HAP Bronch 06/08/24 complicated by procedural hypotension, worsening airway disease Restarted IV antibiotics 06/08/2024 Lactic acid rising, unable to persistently obtain sats above 80 Would not offer further circuit exchange at this point Concerning clinical trajectory over the past 24-48 hours   Morene JINNY Brownie, MD  11:33 AM

## 2024-06-24 NOTE — Progress Notes (Signed)
 NAME:  Keith Gibbs, MRN:  [id number], DOB:  October 03, 1960, LOS: 26 ADMISSION DATE:  05/09/2024, CONSULTATION DATE: 05/14/2024 REFERRING MD: CTS, CHIEF COMPLAINT: Acute on chronic hypoxic respiratory failure  History of Present Illness:  63 yo male presented for lobectomy with CTS. Progressing as expected post operatively with CT in place and was on 4E when this morning he decompensated with worsening hypoxemia and increased wob. CTS transferred to ICU and requested CCM evaluation.   Patient went into ARDS and eventually cannulated for VV ECMO.  Pertinent  Medical History  RUL nodule s/p RUL lobectomy Bipolar 1 d/o Schizophrenia Polysubstance abuse Cad Mild to mod Ao insuff Htn Hyperlipidemia Tobacco use COPD Hep C  Significant Hospital Events: Including procedures, antibiotic start and stop dates in addition to other pertinent events   Admitted 10/16 for RUL Lobectomy Transferred to ICU 10/21  10/24 started zosyn 10/26 - Intubated.  He was difficult to oxygenate and ventilate therefore APRV started.  Started meropenem, azithromycin , cefepime, & vanc 10/27 con't vanc & mero 10/29 -nitric oxide weaned off overnight-did not change his respiratory status.  Proned. 10/29 -worsened despite proning > cannulated for VV ECMO.  Started micafungin empirically, con't previous abd- vanc & mero 10/30 -blood cultures from 05/19/2024 and BAL cultures from 05/19/2024 grew Candida glabrata 10/31 - 11/2 unable to get the patient off paralytics  11/3 tracheostomy (ENT), weaned off nimbex, TEE with repositioning of crescent cannula 11/4 weaned off ketamine , circuit exchanged at beside for failing oxygenator, increasing vasopressor requirements and new bradycardia 11/8 air entrainment, emergency circuit exchange 11/10 worsening lactic acidosis and ongoing hypoxemia, unable to wean sedation, stopped lasix drip   Interim History / Subjective:  Refractory hypoxemia yesterday with PaO2<45. Had to  resume sedation. Lactic acidosis with bedside POCUS with good biventricular function,   Objective    Blood pressure (!) 114/45, pulse 87, temperature 99.5 F (37.5 C), resp. rate (!) 29, height 6' 3 (1.905 m), weight 115.2 kg, SpO2 (!) 77%.    Vent Mode: PCV FiO2 (%):  [100 %] 100 % Set Rate:  [25 bmp] 25 bmp PEEP:  [10 cmH20] 10 cmH20 Plateau Pressure:  [25 cmH20-28 cmH20] 27 cmH20   Intake/Output Summary (Last 24 hours) at 06-22-24 0959 Last data filed at June 22, 2024 0900 Gross per 24 hour  Intake 3905.55 ml  Output 4260 ml  Net -354.45 ml   Filed Weights   06/02/24 0500 06/03/24 0500 06/22/24 0500  Weight: 115.8 kg 116.8 kg 115.2 kg    Examination: General: critically ill appearing male, laying in bed, sedated HEENT: [place]/AT, left eye with conjunctival hemorrhage Neck: trach in place Lungs: absent breath sounds, right pleural chest tube draining serous fluid with no air leak Cardiovascular: regular rate and rhythm, normal S1 and S2 Abdomen: soft, non-distended Extremities: 1+ pitting dependent edema Neuro: RASS -5, pupils pinpoint and sluggish  I/O -407.8cc  Ventilator PC 25/20/10/100%, Vt 110's  VV ECMO Flow: 3665 RPM>  4.7L with iNO via oxygenator Sweep: 11 L/min  P venous -90 Delta P 27  Resolved problem list  AKI Pseudomonas pneumonia Hypernatremia Hypokalemia Sinus bradycardia Elevated transaminases   Assessment and Plan   Acute hypoxic and hypercapnic respiratory failure due to ARDS in the setting of candida glabrata and pseudomonas pneumonia s/p VV ECMO s/p tracheostomy 11/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 History of COPD Persistent air leak> now sealed Right hydropneumothorax s/p right upper lobectomy Left pleural effusion -continue VV ECMO support; iNO via oxygenator -start iNO via vent  -  bival with goal PTT 70-80 -goal flow ~5L; wean flow as needed and tolerated to facilitate weaning sedation  -titrate sweep for normal  pH -continue micafungin -LTVV; ultralung protective ventilation -bedside US  with small left pleural effusion and right apical loculated effusion, will flush right surgical chest tube and put back to suction, if unable to drain consider right pigtail placement. Will place left pigtail  -had added metoprolol to decrease HR yesterday with no clear improvement and now increasing pressor requirements, will stop -goal SpO2>80% -no steriods -bronchodilators- yupelri & brovana; avoid ICS -holding further diuresis given contraction alkalosis, increasing BUN and   Pyrexia Leukocytosis BAL with thick secretions concerning for VAP Completed course of meropenem for PsA pneumonia on 05/30/2024. This morning BAL with thick, copious tan secretions and biofilm throughout right and left lung. In addition has ongoing hypoxemia, leukocytosis and fever to 38 yesterday. - Follow-up BAL Jun 28, 2024 - Resume antibiotics zosyn and linezolid Jun 28, 2024-)  Acute encephalopathy due to sedation History of anxiety, schizoaffective disorder bipolar type  -sedation as required to tolerate ECMO & MV -olanzapine , enteral oxy & clonazepam -PAD protocol with HM infusion, wean as able  -resumed home depakote  yesterday in hopes of weaning continuous HM infusion  -holding PTA cymbalta   Hypotension Lactic acidosis, improving  Suspect multifactorial in the setting of sedation, medication, hypovolemia, and hypoxemia.  -NE and vaso PRN for MAP<65  Candida glabrata pneumonia & fungemia  Needs a longer course of antifungals given ECMO cannula placed prior to start of antifungals. -micafungin per ID, tentatively until 06/20/24 with plan for 4 week course. ID signed off for now, re-engage 11/25 if needed to extend antifungal coverage (see note 11/9) -eventually needs ophtho eval  Hyperphosphatemia, improving  -continue tums to bind  Elevated direct and indirect bilirubin  -continue to monitor   RLE DVT  -bival, needs treatment for  minimum 3 months  Anemia in the setting of critical illness and acute blood loss -transfuse for Hb <8 or hemodynamically significant bleeding  Malnutrition  -TF at goal  Deconditioning -expect a long rehab course  GI prophylaxis: PPI  DVT prophylaxis: bival infusion   Plan discussed during ECMO rounds.   Labs   CBC: Recent Labs  Lab 05/31/24 0427 05/31/24 0437 06/01/24 0347 06/01/24 0459 06/02/24 0420 06/02/24 0742 06/02/24 1555 06/02/24 1605 06/03/24 0410 06/03/24 0415 06/03/24 1644 06/03/24 1649 06/03/24 2011 28-Jun-2024 0509 Jun 28, 2024 0518 06/28/24 0739  WBC 10.5   < > 11.4*   < > 12.8*  --  20.9*  --  18.4*  --  10.6*  --   --   --  11.0*  --   NEUTROABS 6.5  --  7.8*  --  10.0*  --   --   --  15.8*  --   --   --   --   --  8.9*  --   HGB 7.6*   < > 7.8*   < > 7.8*   < > 8.4*   < > 8.5*   < > 8.4* 8.8* 9.2* 8.5* 8.3* 8.8*  HCT 23.8*   < > 23.3*   < > 24.1*   < > 26.2*   < > 26.1*   < > 26.1* 26.0* 27.0* 25.0* 26.3* 26.0*  MCV 102.1*   < > 99.1   < > 99.2  --  100.0  --  98.9  --  98.1  --   --   --  100.4*  --   PLT 164   < >  145*   < > 146*  --  182  --  174  --  161  --   --   --  159  --    < > = values in this interval not displayed.    Basic Metabolic Panel: Recent Labs  Lab 06/01/24 0347 06/01/24 0459 06/01/24 1619 06/01/24 1629 06/02/24 0420 06/02/24 0742 06/02/24 1555 06/02/24 1605 06/03/24 0410 06/03/24 0415 06/03/24 1644 06/03/24 1649 06/03/24 2011 2024/06/17 0509 06-17-2024 0518 06-17-24 0739  NA 136   < > 137   < > 137   < > 136   < > 139   < > 143 143 141 143 145 143  K 3.3*   < > 3.9   < > 4.3   < > 4.2   < > 3.5   < > 2.8* 2.8* 3.5 4.0 4.1 4.0  CL 98  --  96*  --  97*  --  97*  --  95*  --  94*  --   --   --  97*  --   CO2 25  --  29  --  27  --  30  --  31  --  33*  --   --   --  33*  --   GLUCOSE 149*  --  139*  --  164*  --  154*  --  185*  --  151*  --   --   --  143*  --   BUN 37*  --  38*  --  46*  --  45*  --  46*  --  50*  --    --   --  61*  --   CREATININE 0.70  --  0.73  --  0.71  --  0.73  --  0.68  --  0.74  --   --   --  0.94  --   CALCIUM  8.4*  --  7.9*  --  8.0*  --  8.1*  --  8.4*  --  8.2*  --   --   --  8.2*  --   MG 1.8  --  2.0  --  2.1  --   --   --  1.9  --   --   --   --   --  2.5*  --   PHOS 5.1*  --  5.7*  --  6.1*  --   --   --  5.3*  --   --   --   --   --  4.9*  --    < > = values in this interval not displayed.   GFR: Estimated Creatinine Clearance: 110.1 mL/min (by C-G formula based on SCr of 0.94 mg/dL). Recent Labs  Lab 06/02/24 1555 06/02/24 1612 06/03/24 0410 06/03/24 0420 06/03/24 1644 06/03/24 1650 06/03/24 2011 Jun 17, 2024 0509 06-17-24 0518 06-17-24 0734  WBC 20.9*  --  18.4*  --  10.6*  --   --   --  11.0*  --   LATICACIDVEN  --    < >  --    < >  --  3.1* 2.7* 2.6*  --  3.3*   < > = values in this interval not displayed.    Liver Function Tests: Recent Labs  Lab 05/31/24 0427 06/01/24 0347 06/02/24 0420 06/03/24 0410 June 17, 2024 0518  AST 35 47* 54* 51* 40  ALT 35 32 35 31 23  ALKPHOS 145* 140* 133* 130*  96  BILITOT 1.0 0.9 1.1 1.1 2.9*  PROT 5.4* 5.5* 5.6* 5.9* 5.7*  ALBUMIN  1.8* 1.8* 1.9* 1.8* 1.8*   The patient is critically ill with multiple organ system failure and requires high complexity decision making for assessment and support, frequent evaluation and titration of therapies, advanced monitoring, review of radiographic studies and interpretation of complex data.   Critical Care Time devoted to patient care services, exclusive of separately billable procedures, described in this note is 45 minutes.  Rexene LOISE Blush, PA-C [facility] July 04, 2024 9:59 AM  Please see [email or web address] for pager details.  From 7A-7P if no response, please call [phone] After hours, please call ELink [phone]

## 2024-06-24 NOTE — Plan of Care (Addendum)
  Interdisciplinary Goals of Care Family Meeting   Date carried out:: 06-13-24  Location of the meeting: Bedside  Member's involved: Patiet;s mother, sister, bedside RN, Dr.Ben Zenaida and Dr.Leeloo Silverthorne Gatha Rower Power of Attorney or acting medical decision maker: Patient's Mother -  Mitchael Luckey   Discussion: We discussed goals of care for [employer] .  Dr.Ben Zenaida of cardiology led the disussion and I was present as well. The patient's mother is updated on the patient's deteriorating status.  At this stage she does not want the patient to suffer anymore.  She made a decision to continue to escalate care to comfort care measures only.  She waited for the family to make their visitations patient is transitioning to comfort care  Code status: Full DNR  Disposition: In-patient comfort care   Time spent for the meeting: 20 minutes.  [facility] June 13, 2024, 6:08 PM

## 2024-06-24 NOTE — Progress Notes (Signed)
 Assisted with Bronchoscopy at bedside. Sent specimen to lab of 17cc of thick, tan secretions for analysis.  Then setup Nitric in line with Vent in addition to NO running through ECMO circuit.  Starting dose 20 PPM.

## 2024-06-24 NOTE — Progress Notes (Signed)
 TCTS PM Rounding Progress Note  S/p bilateral pigtail placement today.    Vitals:   06/07/24 1530 06-07-2024 1545  BP:    Pulse: 76 82  Resp: 16 (!) 21  Temp: 97.9 F (36.6 C) 97.7 F (36.5 C)  SpO2: (!) 73% (!) 77%    Plan: - Withdrawal of care this evening once more family has arrived  Con Clunes, MD Cardiothoracic Surgery Pager: [phone]

## 2024-06-24 NOTE — Progress Notes (Signed)
 ECMO flows weaned down to 1L and sweep down to 1 per Dr. Zenaida at bedside. Cannulas clammed at 1825.

## 2024-06-24 NOTE — Progress Notes (Signed)
 Sent to Primary Care.

## 2024-06-24 NOTE — Progress Notes (Signed)
 Patient removed from life support at 1830 for comfort care.

## 2024-06-24 NOTE — Progress Notes (Signed)
  Echocardiogram Echocardiogram Transesophageal has been performed.  Keith Gibbs Jul 03, 2024, 5:08 PM

## 2024-06-24 NOTE — Progress Notes (Signed)
 115cc of pharmacy mixed Dilaudid  (2mg /1cc) infusion wasted in stericycle with Aliene December, RN and myself.

## 2024-06-24 NOTE — Progress Notes (Signed)
 Patient ID: CURTIES CONIGLIARO, male   DOB: 06/23/1961, 63 y.o.   MRN: [id number]    Advanced Heart Failure Rounding Note   Subjective:    - Placed on VV ECMO 05/22/2024 - TEE with cannula adjustment 11/3 - Trach 11/3, appreciate ENT - S/p circuit change on 11/4, improvement in post gas from 151 to 540  - S/p circuit change 11/8 for air entrainment   Bronch today for worsening airspace disease.  Procedure complicated by hypotension with sedation, maps dropping into the 30s.  Required phenylephrine  and epinephrine  bolus with eventual recovery.  Airways with heavy, thick secretions.  Oxygen saturations have been unable to stay above 80, no significant change when the cannula is manipulated.   Objective:   Weight Range:  Vital Signs:   Temp:  [98.8 F (37.1 C)-100.4 F (38 C)] 99.3 F (37.4 C) Jun 27, 2024 1040) Pulse Rate:  [65-93] 84 06-27-2024 1040) Resp:  [0-35] 31 27-Jun-2024 1040) BP: (80-125)/(45-59) 110/49 06-27-2024 1040) SpO2:  [65 %-87 %] 72 % 06-27-2024 1040) Arterial Line BP: (43-209)/(24-68) 95/50 06-27-2024 0845) FiO2 (%):  [100 %] 100 % 2024-06-27 1040) Weight:  [115.2 kg] 115.2 kg June 27, 2024 0500) Last BM Date : 06/03/24  Weight change: Filed Weights   06/02/24 0500 06/03/24 0500 2024-06-27 0500  Weight: 115.8 kg 116.8 kg 115.2 kg    Intake/Output:   Intake/Output Summary (Last 24 hours) at 06/27/2024 1136 Last data filed at 2024-06-27 1100 Gross per 24 hour  Intake 4676.18 ml  Output 4410 ml  Net 266.18 ml     Physical Exam: GENERAL: intubated PULM:  ventilated breath sounds CARDIAC:  JVP: mildly elevated, RIJ VV cannula in place, dressing C/d/i        RRR, trace edema ABDOMEN: Soft, non-tender, non-distended. NEUROLOGIC: sedated   Labs: Basic Metabolic Panel: Recent Labs  Lab 06/01/24 0347 06/01/24 0459 06/01/24 1619 06/01/24 1629 06/02/24 0420 06/02/24 0742 06/02/24 1555 06/02/24 1605 06/03/24 0410 06/03/24 0415 06/03/24 1644 06/03/24 1649 06/03/24 2011  27-Jun-2024 0509 June 27, 2024 0518 06-27-2024 0739  NA 136   < > 137   < > 137   < > 136   < > 139   < > 143 143 141 143 145 143  K 3.3*   < > 3.9   < > 4.3   < > 4.2   < > 3.5   < > 2.8* 2.8* 3.5 4.0 4.1 4.0  CL 98  --  96*  --  97*  --  97*  --  95*  --  94*  --   --   --  97*  --   CO2 25  --  29  --  27  --  30  --  31  --  33*  --   --   --  33*  --   GLUCOSE 149*  --  139*  --  164*  --  154*  --  185*  --  151*  --   --   --  143*  --   BUN 37*  --  38*  --  46*  --  45*  --  46*  --  50*  --   --   --  61*  --   CREATININE 0.70  --  0.73  --  0.71  --  0.73  --  0.68  --  0.74  --   --   --  0.94  --   CALCIUM  8.4*  --  7.9*  --  8.0*  --  8.1*  --  8.4*  --  8.2*  --   --   --  8.2*  --   MG 1.8  --  2.0  --  2.1  --   --   --  1.9  --   --   --   --   --  2.5*  --   PHOS 5.1*  --  5.7*  --  6.1*  --   --   --  5.3*  --   --   --   --   --  4.9*  --    < > = values in this interval not displayed.    Liver Function Tests: Recent Labs  Lab 05/31/24 0427 06/01/24 0347 06/02/24 0420 06/03/24 0410 06/12/2024 0518  AST 35 47* 54* 51* 40  ALT 35 32 35 31 23  ALKPHOS 145* 140* 133* 130* 96  BILITOT 1.0 0.9 1.1 1.1 2.9*  PROT 5.4* 5.5* 5.6* 5.9* 5.7*  ALBUMIN  1.8* 1.8* 1.9* 1.8* 1.8*   No results for input(s): LIPASE, AMYLASE in the last 168 hours. No results for input(s): AMMONIA in the last 168 hours.  CBC: Recent Labs  Lab 05/31/24 0427 05/31/24 0437 06/01/24 0347 06/01/24 0459 06/02/24 0420 06/02/24 0742 06/02/24 1555 06/02/24 1605 06/03/24 0410 06/03/24 0415 06/03/24 1644 06/03/24 1649 06/03/24 2011 2024-06-12 0509 06/12/2024 0518 Jun 12, 2024 0739  WBC 10.5   < > 11.4*   < > 12.8*  --  20.9*  --  18.4*  --  10.6*  --   --   --  11.0*  --   NEUTROABS 6.5  --  7.8*  --  10.0*  --   --   --  15.8*  --   --   --   --   --  8.9*  --   HGB 7.6*   < > 7.8*   < > 7.8*   < > 8.4*   < > 8.5*   < > 8.4* 8.8* 9.2* 8.5* 8.3* 8.8*  HCT 23.8*   < > 23.3*   < > 24.1*   < > 26.2*   <  > 26.1*   < > 26.1* 26.0* 27.0* 25.0* 26.3* 26.0*  MCV 102.1*   < > 99.1   < > 99.2  --  100.0  --  98.9  --  98.1  --   --   --  100.4*  --   PLT 164   < > 145*   < > 146*  --  182  --  174  --  161  --   --   --  159  --    < > = values in this interval not displayed.    Cardiac Enzymes: No results for input(s): CKTOTAL, CKMB, CKMBINDEX, TROPONINI in the last 168 hours.  BNP: BNP (last 3 results) Recent Labs    03/20/24 1419  BNP 20.6    ProBNP (last 3 results) No results for input(s): PROBNP in the last 8760 hours.      Medications:     Scheduled Medications:  arformoterol  15 mcg Nebulization BID   calcium  carbonate  1 tablet Per Tube BID   Chlorhexidine  Gluconate Cloth  6 each Topical Daily   clonazePAM  1 mg Per Tube Q8H   EPINEPHrine        feeding supplement (PROSource TF20)  60 mL Per Tube QID   fiber supplement (BANATROL TF)  60 mL Per  Tube BID   free water   100 mL Per Tube Q4H   gabapentin   600 mg Per Tube Daily   And   gabapentin   1,200 mg Per Tube QHS   Gerhardt's butt cream   Topical BID   insulin aspart  0-15 Units Subcutaneous Q4H   insulin aspart  3 Units Subcutaneous Q4H   OLANZapine   5 mg Intravenous Q8H   mouth rinse  15 mL Mouth Rinse Q2H   oxyCODONE   15 mg Per Tube QID   pantoprazole  (PROTONIX ) IV  40 mg Intravenous QHS   potassium chloride   40 mEq Per Tube BID   revefenacin  175 mcg Nebulization Daily   sodium chloride  flush  10-40 mL Intracatheter Q12H   sodium chloride  flush  10-40 mL Intracatheter Q12H   valproic acid   1,000 mg Per Tube QHS   valproic acid   250 mg Per Tube Q0600   valproic acid   500 mg Per Tube Q1400    Infusions:  albumin  human Stopped (July 04, 2024 1019)   bivalirudin  (ANGIOMAX ) 250 mg in sodium chloride  0.9 % 500 mL (0.5 mg/mL) infusion 0.11 mg/kg/hr (02-Oct-2023 1100)   clevidipine Stopped (06/03/24 0934)   dexmedetomidine  (PRECEDEX ) IV infusion 1.1 mcg/kg/hr (2024-07-04 1100)   feeding supplement (VITAL 1.5  CAL) 65 mL/hr at 07/04/2024 1100   HYDROmorphone  3 mg/hr (07-04-24 1100)   linezolid (ZYVOX) IV 300 mL/hr at 02-Oct-2023 1100   micafungin (MYCAMINE) 200 mg in sodium chloride  0.9 % 100 mL IVPB Stopped (06/03/24 1812)   midazolam  Stopped (06/02/24 1513)   norepinephrine (LEVOPHED) Adult infusion 13 mcg/min (04-Jul-2024 1100)   piperacillin-tazobactam (ZOSYN)  IV 12.5 mL/hr at Jul 04, 2024 1100   vasopressin  0.04 Units/min (07-04-24 1100)    PRN Medications: albumin  human, artificial tears, EPINEPHrine , HYDROmorphone , hydrOXYzine , ipratropium-albuterol , lactulose, midazolam , ondansetron  (ZOFRAN ) IV, mouth rinse, sodium chloride  flush, sodium chloride  flush   Assessment/Plan:   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 - s/p VV ECMO cannulation 05/22/24 - S/p circuit change on 11/4, improvement in post gas from 151 to 540  - S/p circuit change 11/8 for air entrainment - Bivalirudin  goal PTT of 70-80, discussed goals with pharmacy - Nitric oxide through the circuit for anticoagulation and long pump run - TEE with cannula adjustment 11/3 - Trach 11/3, appreciate ENT - Completed course of meropenem on 11/7 - Worsening airspace disease, repeat bronch with cultures sent - Restart abx, linezolid and zosyn Jul 04, 2024 - Hold further diuresis - CCM managing vent and chest tube - Continue micafungin - Transfuse hgb < 7.5  Shock: - Back on NE, suspect due to worsening oxygenation, potentially diuresis versus PNA - Abx as above  RUL lung CA s/p lobectomy COPD with ongoing tobacco use Bipolar d/o CAD Candida glabrata fungemia/PNA - ID following - on micafungin for fungemia, ID following, course to be determined given indwelling lines - steroids off - Completed meropenem 11/7 - Restarted IV linezolid/zosyn on Jul 04, 2024  CHF/HFpEF - Good diuresis yesterday with Lasix gtt 8 mg/h and metolazone, held today  Remains critically ill with ARDS/fungal PNA/PTX on top of severe  underlying lung disease. Long run expected. Concerning clinical trajectory. Would not offer additional VV cannula or circuit exchange at this juncture, discussed during MDS rounds.   CRITICAL CARE Performed by: Morene JINNY Brownie   Total critical care time: 40 minutes  Critical care time was exclusive of separately billable procedures and treating other patients.  Critical care was necessary to treat or prevent imminent or life-threatening deterioration.  Critical  care was time spent personally by me on the following activities: development of treatment plan with patient and/or surrogate as well as nursing, discussions with consultants, evaluation of patient's response to treatment, examination of patient, obtaining history from patient or surrogate, ordering and performing treatments and interventions, ordering and review of laboratory studies, ordering and review of radiographic studies, pulse oximetry and re-evaluation of patient's condition.

## 2024-06-24 NOTE — Progress Notes (Signed)
  Patient's Family along with Morene Brownie and this Nurse at bedside when comfort care/ withdraw protocol started. Patient asystole at 1835. This Nurse Auscultated heart and lungs sounds for 2 minutes, no sounds noted. Second RN verified: Rankin Christians RN . Pt pronounced at 1835 on 2024/07/02.

## 2024-06-24 NOTE — CV Procedure (Signed)
   TRANSESOPHAGEAL ECHOCARDIOGRAM  NAME:  Keith Gibbs    MRN: [id number] DOB:  [birth year]    ADMIT DATE: 05/09/2024  INDICATIONS: Worsening oxygenation  PROCEDURE:   Informed consent was obtained prior to the procedure. The risks, benefits and alternatives for the procedure were discussed and the patient comprehended these risks.  Risks include, but are not limited to, cough, sore throat, vomiting, nausea, somnolence, esophageal and stomach trauma or perforation, bleeding, low blood pressure, aspiration, pneumonia, infection, trauma to the teeth and death.    After a procedural timeout, the patient was sedated with precedex  and dilaudid  drips already hanging.  The patient's heart rate, blood pressure, and oxygen saturation were monitored continuously during the procedure. The period of conscious sedation was 15 minutes, of which I was present face-to-face 100% of this time.  The transesophageal probe was inserted in the esophagus and stomach without difficulty and multiple views were obtained.  Remained in critical condition following the procedure.   COMPLICATIONS:    Complications: No complications.    KEY FINDINGS:  Good cannula position Full Report to follow.   Morene Brownie Advanced Heart Failure 5:42 PM

## 2024-06-24 NOTE — Procedures (Signed)
 Insertion of Chest Tube Procedure Note  Keith Gibbs  [id number]  [birth year]  Date:06-25-2024  Time:1:33 PM    Provider Performing: Rexene LOISE Blush   Procedure: Pleural Catheter Insertion w/ Imaging Guidance (67442)  Indication(s) Right hydropneumothorax  Consent Risks of the procedure as well as the alternatives and risks of each were explained to the patient and/or caregiver.  Consent for the procedure was obtained and is signed in the bedside chart  Anesthesia 1% lidocaine , 4 mg versed , 2 mg HM and 60 mg rocuronium    Time Out Verified patient identification, verified procedure, site/side was marked, verified correct patient position, special equipment/implants available, medications/allergies/relevant history reviewed, required imaging and test results available.   Sterile Technique Maximal sterile technique including full sterile barrier drape, hand hygiene, sterile gown, sterile gloves, mask, hair covering, sterile ultrasound probe cover (if used).   Procedure Description Ultrasound used to identify appropriate pleural anatomy for placement and overlying skin marked. Area of placement cleaned and draped in sterile fashion.  A 14 French pigtail pleural catheter was placed into the right pleural space using Seldinger technique. Appropriate return of straw colored fluid was obtained.  The tube was connected to atrium and placed on -20 cm H2O wall suction.   Complications/Tolerance None; patient tolerated the procedure well. Chest X-ray is ordered to verify placement.   EBL Minimal  Specimen(s) Fluid   Rexene LOISE Blush, PA-C [facility] June 25, 2024 1:34 PM  Please see [email or web address] for pager details.  From 7A-7P if no response, please call [phone] After hours, please call ELink [phone]

## 2024-06-24 DEATH — deceased

## 2024-06-27 ENCOUNTER — Encounter (HOSPITAL_COMMUNITY): Payer: MEDICAID | Admitting: Psychiatry

## 2024-07-02 LAB — FUNGUS CULTURE RESULT

## 2024-07-02 LAB — FUNGUS CULTURE WITH STAIN

## 2024-07-02 LAB — FUNGAL ORGANISM REFLEX

## 2024-07-03 NOTE — Progress Notes (Signed)
 Sent to Primary Care.

## 2024-07-05 ENCOUNTER — Ambulatory Visit: Payer: MEDICAID | Admitting: Family

## 2024-07-06 LAB — ACID FAST CULTURE WITH REFLEXED SENSITIVITIES (MYCOBACTERIA): Acid Fast Culture: NEGATIVE

## 2024-07-08 NOTE — Progress Notes (Signed)
 Sent to Primary Care.
